# Patient Record
Sex: Female | Born: 1950
Health system: Southern US, Community
[De-identification: ages and names within clinical notes are randomized; demographics above are authoritative.]

## PROBLEM LIST (undated history)

## (undated) ENCOUNTER — Ambulatory Visit

## (undated) ENCOUNTER — Emergency Department (HOSPITAL_BASED_OUTPATIENT_CLINIC_OR_DEPARTMENT_OTHER): Admission: EM | Disposition: A | Discharge status: Home / Self Care | Source: Home / Self Care

## (undated) DIAGNOSIS — K219 Gastro-esophageal reflux disease without esophagitis: Secondary | ICD-10-CM

## (undated) DIAGNOSIS — G43909 Migraine, unspecified, not intractable, without status migrainosus: Secondary | ICD-10-CM

## (undated) DIAGNOSIS — R296 Repeated falls: Secondary | ICD-10-CM

## (undated) DIAGNOSIS — K449 Diaphragmatic hernia without obstruction or gangrene: Secondary | ICD-10-CM

## (undated) DIAGNOSIS — K5790 Diverticulosis of intestine, part unspecified, without perforation or abscess without bleeding: Secondary | ICD-10-CM

## (undated) DIAGNOSIS — F32A Depression, unspecified: Secondary | ICD-10-CM

## (undated) DIAGNOSIS — F603 Borderline personality disorder: Secondary | ICD-10-CM

## (undated) DIAGNOSIS — F6089 Other specific personality disorders: Secondary | ICD-10-CM

## (undated) DIAGNOSIS — E039 Hypothyroidism, unspecified: Secondary | ICD-10-CM

## (undated) DIAGNOSIS — J309 Allergic rhinitis, unspecified: Secondary | ICD-10-CM

## (undated) DIAGNOSIS — K589 Irritable bowel syndrome without diarrhea: Secondary | ICD-10-CM

## (undated) DIAGNOSIS — W19XXXA Unspecified fall, initial encounter: Secondary | ICD-10-CM

## (undated) DIAGNOSIS — H538 Other visual disturbances: Secondary | ICD-10-CM

## (undated) DIAGNOSIS — C4490 Unspecified malignant neoplasm of skin, unspecified: Secondary | ICD-10-CM

## (undated) DIAGNOSIS — R4586 Emotional lability: Secondary | ICD-10-CM

## (undated) DIAGNOSIS — F4310 Post-traumatic stress disorder, unspecified: Secondary | ICD-10-CM

## (undated) DIAGNOSIS — G629 Polyneuropathy, unspecified: Secondary | ICD-10-CM

## (undated) DIAGNOSIS — N809 Endometriosis, unspecified: Secondary | ICD-10-CM

## (undated) DIAGNOSIS — J449 Chronic obstructive pulmonary disease, unspecified: Secondary | ICD-10-CM

## (undated) DIAGNOSIS — D649 Anemia, unspecified: Secondary | ICD-10-CM

## (undated) DIAGNOSIS — F329 Major depressive disorder, single episode, unspecified: Secondary | ICD-10-CM

## (undated) DIAGNOSIS — E079 Disorder of thyroid, unspecified: Secondary | ICD-10-CM

## (undated) HISTORY — PX: TONSILLECTOMY: SUR1361

## (undated) HISTORY — DX: Irritable bowel syndrome, unspecified: K58.9

## (undated) HISTORY — DX: Post-traumatic stress disorder, unspecified: F43.10

## (undated) HISTORY — PX: APPENDECTOMY: SHX54

## (undated) HISTORY — PX: CHOLECYSTECTOMY, LAPAROSCOPIC: SHX56

## (undated) HISTORY — PX: CHOLECYSTECTOMY: SHX55

## (undated) HISTORY — DX: Other specific personality disorders: F60.89

## (undated) HISTORY — DX: Borderline personality disorder: F60.3

## (undated) HISTORY — DX: Repeated falls: R29.6

## (undated) HISTORY — DX: Emotional lability: R45.86

## (undated) HISTORY — DX: Unspecified fall, initial encounter: W19.XXXA

## (undated) HISTORY — PX: OTHER SURGICAL HISTORY: SHX169

## (undated) HISTORY — PX: HERNIA REPAIR: SHX51

---

## 2003-12-30 ENCOUNTER — Emergency Department: Payer: Self-pay | Admitting: General Practice

## 2004-01-19 ENCOUNTER — Emergency Department: Payer: Self-pay | Admitting: Emergency Medicine

## 2005-03-04 ENCOUNTER — Inpatient Hospital Stay: Payer: Self-pay | Admitting: Internal Medicine

## 2005-03-04 ENCOUNTER — Other Ambulatory Visit: Payer: Self-pay

## 2005-05-25 ENCOUNTER — Emergency Department: Payer: Self-pay | Admitting: Emergency Medicine

## 2005-07-05 ENCOUNTER — Ambulatory Visit: Payer: Self-pay | Admitting: Obstetrics and Gynecology

## 2005-08-27 ENCOUNTER — Inpatient Hospital Stay: Payer: Self-pay | Admitting: Unknown Physician Specialty

## 2006-06-30 ENCOUNTER — Emergency Department: Payer: Self-pay | Admitting: General Practice

## 2006-09-13 ENCOUNTER — Ambulatory Visit: Payer: Self-pay | Admitting: Gastroenterology

## 2006-09-26 ENCOUNTER — Ambulatory Visit: Payer: Self-pay | Admitting: Gastroenterology

## 2007-03-26 ENCOUNTER — Ambulatory Visit: Payer: Self-pay | Admitting: Obstetrics and Gynecology

## 2008-04-24 ENCOUNTER — Ambulatory Visit: Payer: Self-pay | Admitting: Obstetrics and Gynecology

## 2008-10-15 ENCOUNTER — Ambulatory Visit: Payer: Self-pay | Admitting: Surgery

## 2008-10-20 ENCOUNTER — Ambulatory Visit: Payer: Self-pay | Admitting: Surgery

## 2009-05-11 ENCOUNTER — Ambulatory Visit: Payer: Self-pay | Admitting: Obstetrics and Gynecology

## 2009-10-05 ENCOUNTER — Ambulatory Visit: Payer: Self-pay | Admitting: Family Medicine

## 2009-10-28 ENCOUNTER — Ambulatory Visit: Payer: Self-pay | Admitting: Gastroenterology

## 2009-12-19 ENCOUNTER — Inpatient Hospital Stay: Payer: Self-pay | Admitting: Internal Medicine

## 2010-09-13 ENCOUNTER — Ambulatory Visit: Payer: Self-pay | Admitting: Obstetrics and Gynecology

## 2010-10-17 ENCOUNTER — Inpatient Hospital Stay: Payer: Self-pay | Admitting: Psychiatry

## 2011-03-26 ENCOUNTER — Emergency Department: Payer: Self-pay | Admitting: *Deleted

## 2011-03-26 LAB — LIPASE, BLOOD: Lipase: 116 U/L (ref 73–393)

## 2011-03-26 LAB — URINALYSIS, COMPLETE
Nitrite: NEGATIVE
Protein: NEGATIVE
RBC,UR: 1 /HPF (ref 0–5)
Squamous Epithelial: 2
WBC UR: 1 /HPF (ref 0–5)

## 2011-03-26 LAB — COMPREHENSIVE METABOLIC PANEL
Albumin: 3.5 g/dL (ref 3.4–5.0)
Anion Gap: 9 (ref 7–16)
BUN: 9 mg/dL (ref 7–18)
Bilirubin,Total: 0.2 mg/dL (ref 0.2–1.0)
Calcium, Total: 8.6 mg/dL (ref 8.5–10.1)
Chloride: 102 mmol/L (ref 98–107)
Co2: 28 mmol/L (ref 21–32)
Creatinine: 0.64 mg/dL (ref 0.60–1.30)
EGFR (African American): >60
EGFR (Non-African Amer.): >60
Osmolality: 275 (ref 275–301)
Potassium: 4.2 mmol/L (ref 3.5–5.1)
SGOT(AST): 20 U/L (ref 15–37)
Total Protein: 7 g/dL (ref 6.4–8.2)

## 2011-03-26 LAB — CBC
HCT: 36.3 % (ref 35.0–47.0)
MCH: 31 pg (ref 26.0–34.0)
MCHC: 33.4 g/dL (ref 32.0–36.0)
MCV: 93 fL (ref 80–100)
RDW: 12.6 % (ref 11.5–14.5)

## 2011-04-20 ENCOUNTER — Ambulatory Visit: Payer: Self-pay | Admitting: Gastroenterology

## 2011-05-05 ENCOUNTER — Emergency Department: Payer: Self-pay | Admitting: Emergency Medicine

## 2011-05-05 LAB — COMPREHENSIVE METABOLIC PANEL
Albumin: 3.5 g/dL (ref 3.4–5.0)
Anion Gap: 9 (ref 7–16)
BUN: 6 mg/dL — ABNORMAL LOW (ref 7–18)
Bilirubin,Total: 0.2 mg/dL (ref 0.2–1.0)
Calcium, Total: 8.3 mg/dL — ABNORMAL LOW (ref 8.5–10.1)
Chloride: 106 mmol/L (ref 98–107)
Co2: 24 mmol/L (ref 21–32)
Creatinine: 0.65 mg/dL (ref 0.60–1.30)
EGFR (African American): >60
Glucose: 72 mg/dL (ref 65–99)
SGPT (ALT): 39 U/L
Sodium: 139 mmol/L (ref 136–145)
Total Protein: 6.9 g/dL (ref 6.4–8.2)

## 2011-05-05 LAB — URINALYSIS, COMPLETE
Bilirubin,UR: NEGATIVE
Glucose,UR: NEGATIVE mg/dL (ref 0–75)
Ph: 6 (ref 4.5–8.0)
Protein: NEGATIVE
RBC,UR: 1 /HPF (ref 0–5)
Specific Gravity: 1.003 (ref 1.003–1.030)
Squamous Epithelial: 3

## 2011-05-05 LAB — CBC
MCHC: 33.1 g/dL (ref 32.0–36.0)
Platelet: 203 10*3/uL (ref 150–440)
WBC: 5.2 10*3/uL (ref 3.6–11.0)

## 2011-05-05 LAB — LIPASE, BLOOD: Lipase: 112 U/L (ref 73–393)

## 2011-05-16 ENCOUNTER — Ambulatory Visit: Payer: Self-pay | Admitting: Gynecologic Oncology

## 2011-05-17 ENCOUNTER — Ambulatory Visit: Payer: Self-pay | Admitting: Gynecologic Oncology

## 2011-06-06 ENCOUNTER — Ambulatory Visit: Payer: Self-pay | Admitting: Gynecologic Oncology

## 2011-06-17 ENCOUNTER — Ambulatory Visit: Payer: Self-pay | Admitting: Gynecologic Oncology

## 2011-11-30 ENCOUNTER — Ambulatory Visit: Payer: Self-pay | Admitting: Obstetrics and Gynecology

## 2012-01-09 ENCOUNTER — Emergency Department: Payer: Self-pay | Admitting: Emergency Medicine

## 2012-01-09 LAB — URINALYSIS, COMPLETE
Bacteria: NONE SEEN
Bilirubin,UR: NEGATIVE
Blood: NEGATIVE
Glucose,UR: NEGATIVE mg/dL (ref 0–75)
Ketone: NEGATIVE
Nitrite: NEGATIVE
Ph: 7 (ref 4.5–8.0)
Protein: NEGATIVE
RBC,UR: <1 /HPF (ref 0–5)
Squamous Epithelial: 3
WBC UR: 1 /HPF (ref 0–5)

## 2012-01-09 LAB — COMPREHENSIVE METABOLIC PANEL
Albumin: 3.5 g/dL (ref 3.4–5.0)
Anion Gap: 8 (ref 7–16)
BUN: 11 mg/dL (ref 7–18)
Chloride: 94 mmol/L — ABNORMAL LOW (ref 98–107)
Co2: 27 mmol/L (ref 21–32)
EGFR (African American): >60
Osmolality: 259 (ref 275–301)
Potassium: 3.9 mmol/L (ref 3.5–5.1)
SGOT(AST): 20 U/L (ref 15–37)
SGPT (ALT): 20 U/L (ref 12–78)
Sodium: 129 mmol/L — ABNORMAL LOW (ref 136–145)
Total Protein: 6.9 g/dL (ref 6.4–8.2)

## 2012-01-09 LAB — CBC
HGB: 13.2 g/dL (ref 12.0–16.0)
MCH: 32.1 pg (ref 26.0–34.0)
Platelet: 302 10*3/uL (ref 150–440)
RBC: 4.13 10*6/uL (ref 3.80–5.20)
RDW: 12.9 % (ref 11.5–14.5)
WBC: 6.6 10*3/uL (ref 3.6–11.0)

## 2012-01-09 LAB — RAPID INFLUENZA A&B ANTIGENS

## 2012-01-09 LAB — LIPASE, BLOOD: Lipase: 196 U/L (ref 73–393)

## 2012-04-03 ENCOUNTER — Ambulatory Visit: Payer: Self-pay | Admitting: Obstetrics and Gynecology

## 2012-04-16 ENCOUNTER — Ambulatory Visit: Payer: Self-pay | Admitting: Specialist

## 2012-07-31 ENCOUNTER — Emergency Department: Payer: Self-pay | Admitting: Internal Medicine

## 2012-07-31 LAB — CBC
MCH: 31.4 pg (ref 26.0–34.0)
MCHC: 34.5 g/dL (ref 32.0–36.0)
MCV: 91 fL (ref 80–100)
RBC: 3.86 10*6/uL (ref 3.80–5.20)
RDW: 13.3 % (ref 11.5–14.5)

## 2012-07-31 LAB — COMPREHENSIVE METABOLIC PANEL
Anion Gap: 4 — ABNORMAL LOW (ref 7–16)
BUN: 11 mg/dL (ref 7–18)
Creatinine: 0.94 mg/dL (ref 0.60–1.30)
Glucose: 111 mg/dL — ABNORMAL HIGH (ref 65–99)
Osmolality: 265 (ref 275–301)
SGPT (ALT): 13 U/L (ref 12–78)
Sodium: 132 mmol/L — ABNORMAL LOW (ref 136–145)
Total Protein: 6.9 g/dL (ref 6.4–8.2)

## 2012-08-01 LAB — URINALYSIS, COMPLETE
Bilirubin,UR: NEGATIVE
Glucose,UR: NEGATIVE mg/dL (ref 0–75)
Ketone: NEGATIVE
Ph: 7 (ref 4.5–8.0)
Protein: NEGATIVE
RBC,UR: <1 /HPF (ref 0–5)

## 2012-10-17 ENCOUNTER — Ambulatory Visit: Payer: Self-pay | Admitting: Gastroenterology

## 2012-12-17 ENCOUNTER — Ambulatory Visit: Payer: Self-pay | Admitting: Surgery

## 2013-01-15 ENCOUNTER — Encounter (HOSPITAL_COMMUNITY): Payer: Self-pay | Admitting: *Deleted

## 2013-01-15 ENCOUNTER — Inpatient Hospital Stay (HOSPITAL_COMMUNITY): Payer: Medicare Other

## 2013-01-15 ENCOUNTER — Inpatient Hospital Stay (HOSPITAL_COMMUNITY)
Admission: AD | Admit: 2013-01-15 | Discharge: 2013-01-15 | Disposition: A | Discharge status: Home / Self Care | Payer: Medicare Other | Source: Ambulatory Visit | Attending: Obstetrics & Gynecology | Admitting: Obstetrics & Gynecology

## 2013-01-15 DIAGNOSIS — R109 Unspecified abdominal pain: Secondary | ICD-10-CM | POA: Insufficient documentation

## 2013-01-15 DIAGNOSIS — N950 Postmenopausal bleeding: Secondary | ICD-10-CM

## 2013-01-15 DIAGNOSIS — N83209 Unspecified ovarian cyst, unspecified side: Secondary | ICD-10-CM

## 2013-01-15 DIAGNOSIS — M549 Dorsalgia, unspecified: Secondary | ICD-10-CM | POA: Insufficient documentation

## 2013-01-15 HISTORY — DX: Hypothyroidism, unspecified: E03.9

## 2013-01-15 HISTORY — DX: Endometriosis, unspecified: N80.9

## 2013-01-15 HISTORY — DX: Gastro-esophageal reflux disease without esophagitis: K21.9

## 2013-01-15 LAB — URINE MICROSCOPIC-ADD ON

## 2013-01-15 LAB — URINALYSIS, ROUTINE W REFLEX MICROSCOPIC
Glucose, UA: NEGATIVE mg/dL
Ketones, ur: NEGATIVE mg/dL
Leukocytes, UA: NEGATIVE
Protein, ur: NEGATIVE mg/dL
Urobilinogen, UA: 0.2 mg/dL (ref 0.0–1.0)

## 2013-01-15 LAB — WET PREP, GENITAL
Clue Cells Wet Prep HPF POC: NONE SEEN
Trich, Wet Prep: NONE SEEN

## 2013-01-15 MED ORDER — CLOBETASOL PROPIONATE 0.05 % EX OINT: 1.0000 "application " | TOPICAL_OINTMENT | Freq: Two times a day (BID) | CUTANEOUS | Status: DC

## 2013-01-15 MED ORDER — FLUCONAZOLE 150 MG PO TABS: 150.0000 mg | ORAL_TABLET | Freq: Once | ORAL | Status: DC

## 2013-01-15 MED ORDER — CLOBETASOL PROPIONATE 0.05 % EX CREA: TOPICAL_CREAM | Freq: Two times a day (BID) | CUTANEOUS | Status: DC

## 2013-01-15 MED ORDER — HYDROCODONE-ACETAMINOPHEN 5-325 MG PO TABS: 1.0000 | ORAL_TABLET | ORAL | Status: DC | PRN

## 2013-01-15 NOTE — MAU Note (Signed)
Pt was seen yesterday by her GYN MD and a speculum exam was done but not a bimanual exam, nor a urine test. Wants to be tested for yeast; says her vagina is irritated and red and painful to urinate.

## 2013-01-15 NOTE — MAU Provider Note (Signed)
History     CSN: [id number]  Arrival date and time: 01/15/13 1757   First Provider Initiated Contact with Patient 01/15/13 1910      Chief Complaint  Patient presents with  . Back Pain  . Abdominal Cramping  . Vaginal Bleeding   HPIpt is 62 year old c/o of vaginal bleeding x 5 days.  Pt abdominal pain and bloated and gas. Pt sees GYN in [place].  Pt has been traveling up [place] and visited father in assisted living- there had been a GI virus. Pt has had loose stools until today and hasn't had bowel movement today. Pt had a colonoscopy about 1 month ago and has had diverticulitis.  Pt took Cipro and flagyl recently for presumed Diverticulitis.   Pt has "uterus suspended" in her 75s. Pt has current boyfriend of 8 years.  Last time pt was sexually active was 3-4 years ago.   Pt saw GYN yesterday in [place] and they did a speculum exam, but no ultrasound. Pt has a hx of endometriosis.   Pt was seen yesterday by her GYN MD and a speculum exam was done but not a bimanual exam, nor a urine test. Wants to be tested for yeast; says her vagina is irritated and red and painful to urinate   Past Medical History  Diagnosis Date  . Endometriosis   . Hypothyroidism   . GERD (gastroesophageal reflux disease)   . Bipolar 1 disorder   . Hypertension     Past Surgical History  Procedure Laterality Date  . Tonsillectomy    . Uteral suspension    . Appendectomy    . Hernia repair    . Laproscopy    . Cholecystectomy, laparoscopic      History reviewed. No pertinent family history.  History  Substance Use Topics  . Smoking status: Never Smoker   . Smokeless tobacco: Not on file  . Alcohol Use: No    Allergies:  Allergies  Allergen Reactions  . Peanuts [Peanut Oil] Anaphylaxis  . Amikacin   . Aspirin   . Darvon [Propoxyphene] Hives  . Penicillins Hives  . Percocet [Oxycodone-Acetaminophen] Swelling    Hives in throat  . Prednisone Other (See Comments)    Crazy mood  swings, strange thoughts  . Sulfa Antibiotics Hives  . Ultram [Tramadol] Hives    Prescriptions prior to admission  Medication Sig Dispense Refill  . amLODipine (NORVASC) 2.5 MG tablet Take 2.5 mg by mouth daily.      . Azelastine-Fluticasone (DYMISTA NA) Place into the nose.      . Biotin 5000 MCG CAPS Take by mouth.      . calcium-vitamin D (OSCAL WITH D) 500-200 MG-UNIT per tablet Take 1 tablet by mouth.      . cholecalciferol (VITAMIN D) 1000 UNITS tablet Take 1,000 Units by mouth daily.      Marland Kitchen conjugated estrogens (PREMARIN) vaginal cream Place 1 Applicatorful vaginally daily.      . divalproex (DEPAKOTE) 500 MG DR tablet Take 500 mg by mouth 3 (three) times daily.      Marland Kitchen docusate sodium (COLACE) 100 MG capsule Take 100 mg by mouth 2 (two) times daily.      . fexofenadine (ALLEGRA) 180 MG tablet Take 180 mg by mouth daily.      . hydrocortisone (ANUSOL-HC) 2.5 % rectal cream Place 1 application rectally 2 (two) times daily.      Marland Kitchen levothyroxine (SYNTHROID, LEVOTHROID) 88 MCG tablet Take 88 mcg by mouth daily  before breakfast.      . Linaclotide (LINZESS PO) Take by mouth.      . multivitamin-iron-minerals-folic acid (THERAPEUTIC-M) TABS tablet Take 1 tablet by mouth daily.      . pantoprazole (PROTONIX) 40 MG tablet Take 40 mg by mouth daily.      Marland Kitchen PARoxetine (PAXIL) 30 MG tablet Take 30 mg by mouth daily.      . risperiDONE (RISPERDAL) 2 MG tablet Take 2 mg by mouth at bedtime.      Marland Kitchen terconazole (TERAZOL 7) 0.4 % vaginal cream Place 1 applicator vaginally at bedtime.      Marland Kitchen zolpidem (AMBIEN) 10 MG tablet Take 10 mg by mouth at bedtime as needed for sleep.      . cycloSPORINE (RESTASIS) 0.05 % ophthalmic emulsion 1 drop 2 (two) times daily.        Review of Systems  Constitutional: Negative for fever and chills.  Gastrointestinal: Positive for abdominal pain and diarrhea.  Genitourinary: Positive for dysuria. Negative for flank pain.  Musculoskeletal: Positive for back pain.    Physical Exam   Blood pressure 137/88, pulse 64, temperature 99 F (37.2 C), temperature source Oral, resp. rate 16, height 5\' 4"  (1.626 m), weight 54.432 kg (120 lb), SpO2 97.00%.  Physical Exam  Nursing note and vitals reviewed. Constitutional: She is oriented to person, place, and time. She appears well-developed and well-nourished. No distress.  HENT:  Head: Normocephalic.  Eyes: Pupils are equal, round, and reactive to light.  Neck: Normal range of motion. Neck supple.  Cardiovascular: Normal rate.   Respiratory: Effort normal.  GI: Soft.  Diffuse tenderness; increased bowel sounds  Genitourinary:  ?LS&A with agglutinated clitoral hood; vaginal introitus reddened; atrophic, urethral caruncle noted; cervix nullip, clean, NT; uterus small retroverted- unable to palpate ovaries- diffuse tenderness  Musculoskeletal: Normal range of motion.  Neurological: She is alert and oriented to person, place, and time.  Skin: Skin is warm.  Psychiatric: She has a normal mood and affect.    MAU Course  Procedures [place]- US Transvaginal Non-ob  01/15/2013   CLINICAL DATA:  Postmenopausal bleeding  EXAM: TRANSABDOMINAL AND TRANSVAGINAL ULTRASOUND OF PELVIS  TECHNIQUE: Both transabdominal and transvaginal ultrasound examinations of the pelvis were performed. Transabdominal technique was performed for global imaging of the pelvis including uterus, ovaries, adnexal regions, and pelvic cul-de-sac. It was necessary to proceed with endovaginal exam following the transabdominal exam to visualize the endometrium.  COMPARISON:  04/20/2011  FINDINGS: Uterus  Measurements: 4.4 x 2.3 x 3.2 cm. No fibroids or other mass visualized. Uterus retroverted.  Endometrium  Thickness: 2 mm.  No focal abnormality visualized.  Right ovary  Measurements: 4.1 x 2.1 x 3.4 cm. Anechoic 2.7 x 1.8 x 2.7 cm right ovarian cyst is included in this measurement. Color flow in the parenchyma of the right ovary is observed.  Left ovary   Measurements: 2.0 x 1.1 x 1.0 cm. Normal appearance/no adnexal mass.  Other findings  No free fluid.  IMPRESSION: 1. Endometrium appears normal and measures only 2 mm in thickness. 2. 2.7 cm simple appearing right ovarian cyst. Given the patient's postmenopausal status, cysts of this size warrant a follow-up ultrasound in 1 year. This recommendation follows the consensus statement: Management of Asymptomatic Ovarian and Other Adnexal Cysts Imaged at [place]: Society of Radiologists in Ultrasound Consensus Conference Statement. Radiology 2010; [id number].   Electronically Signed   By: Herbie Baltimore M.D.   On: 01/15/2013 20:55   US Pelvis Complete  01/15/2013  CLINICAL DATA:  Postmenopausal bleeding  EXAM: TRANSABDOMINAL AND TRANSVAGINAL ULTRASOUND OF PELVIS  TECHNIQUE: Both transabdominal and transvaginal ultrasound examinations of the pelvis were performed. Transabdominal technique was performed for global imaging of the pelvis including uterus, ovaries, adnexal regions, and pelvic cul-de-sac. It was necessary to proceed with endovaginal exam following the transabdominal exam to visualize the endometrium.  COMPARISON:  04/20/2011  FINDINGS: Uterus  Measurements: 4.4 x 2.3 x 3.2 cm. No fibroids or other mass visualized. Uterus retroverted.  Endometrium  Thickness: 2 mm.  No focal abnormality visualized.  Right ovary  Measurements: 4.1 x 2.1 x 3.4 cm. Anechoic 2.7 x 1.8 x 2.7 cm right ovarian cyst is included in this measurement. Color flow in the parenchyma of the right ovary is observed.  Left ovary  Measurements: 2.0 x 1.1 x 1.0 cm. Normal appearance/no adnexal mass.  Other findings  No free fluid.  IMPRESSION: 1. Endometrium appears normal and measures only 2 mm in thickness. 2. 2.7 cm simple appearing right ovarian cyst. Given the patient's postmenopausal status, cysts of this size warrant a follow-up ultrasound in 1 year. This recommendation follows the consensus statement: Management of Asymptomatic Ovarian  and Other Adnexal Cysts Imaged at [place]: Society of Radiologists in Ultrasound Consensus Conference Statement. Radiology 2010; [id number].   Electronically Signed   By: Herbie Baltimore M.D.   On: 01/15/2013 20:55   Results for orders placed during the hospital encounter of 01/15/13 (from the past 24 hour(s))  URINALYSIS, ROUTINE W REFLEX MICROSCOPIC     Status: Abnormal   Collection Time    01/15/13  6:20 PM      Result Value Range   Color, Urine YELLOW  YELLOW   APPearance CLEAR  CLEAR   Specific Gravity, Urine 1.015  1.005 - 1.030   pH 7.0  5.0 - 8.0   Glucose, UA NEGATIVE  NEGATIVE mg/dL   Hgb urine dipstick SMALL (*) NEGATIVE   Bilirubin Urine NEGATIVE  NEGATIVE   Ketones, ur NEGATIVE  NEGATIVE mg/dL   Protein, ur NEGATIVE  NEGATIVE mg/dL   Urobilinogen, UA 0.2  0.0 - 1.0 mg/dL   Nitrite NEGATIVE  NEGATIVE   Leukocytes, UA NEGATIVE  NEGATIVE  URINE MICROSCOPIC-ADD ON     Status: None   Collection Time    01/15/13  6:20 PM      Result Value Range   Squamous Epithelial / LPF RARE  RARE   WBC, UA 0-2  <3 WBC/hpf   RBC / HPF 3-6  <3 RBC/hpf   Bacteria, UA RARE  RARE  WET PREP, GENITAL     Status: Abnormal   Collection Time    01/15/13  7:35 PM      Result Value Range   Yeast Wet Prep HPF POC NONE SEEN  NONE SEEN   Trich, Wet Prep NONE SEEN  NONE SEEN   Clue Cells Wet Prep HPF POC NONE SEEN  NONE SEEN   WBC, Wet Prep HPF POC FEW (*) NONE SEEN      Assessment and Plan  Post menopausal bleeding LS&A- temovate cream apply 2 times daily for 2 weeks then off 2 weeks Care handed over to Thressa Sheller, CNM  1. Postmenopausal bleeding   2. Other and unspecified ovarian cyst    RX: clobetasol 0.5% BID x 2 weeks Treated with diflucan here in MAU  Follow-up Information   Schedule an appointment as soon as possible for a visit to follow up. (for an endometrial biopsy, and to follow up  on cyst in 1 year )    Contact information:   [facility]  [place], [place]      Lajuana Ripple 01/15/2013, 7:11 PM

## 2013-01-15 NOTE — MAU Note (Signed)
Patient states she has had some vaginal bleeding, worse in the am, for 5 days. Has been having abdominal cramping and back pain that has gotten worse today. Was seen by her primary MD yesterday.

## 2013-01-15 NOTE — Progress Notes (Signed)
Some vaginal bleeding noted, pt indicating tenderness in vaginal are during exam.

## 2013-01-16 LAB — GC/CHLAMYDIA PROBE AMP
CT Probe RNA: NEGATIVE
GC Probe RNA: NEGATIVE

## 2013-01-17 ENCOUNTER — Emergency Department: Payer: Self-pay | Admitting: Emergency Medicine

## 2013-01-17 LAB — CBC
HCT: 38.1 % (ref 35.0–47.0)
HGB: 12.9 g/dL (ref 12.0–16.0)
MCH: 31.4 pg (ref 26.0–34.0)
MCHC: 33.9 g/dL (ref 32.0–36.0)
MCV: 93 fL (ref 80–100)
PLATELETS: 254 10*3/uL (ref 150–440)
RBC: 4.12 10*6/uL (ref 3.80–5.20)
RDW: 14.6 % — ABNORMAL HIGH (ref 11.5–14.5)
WBC: 5.1 10*3/uL (ref 3.6–11.0)

## 2013-01-17 LAB — URINALYSIS, COMPLETE
BACTERIA: NONE SEEN
Bilirubin,UR: NEGATIVE
Blood: NEGATIVE
Glucose,UR: NEGATIVE mg/dL (ref 0–75)
Ketone: NEGATIVE
Leukocyte Esterase: NEGATIVE
NITRITE: NEGATIVE
PH: 6 (ref 4.5–8.0)
PROTEIN: NEGATIVE
RBC,UR: 2 /HPF (ref 0–5)
SPECIFIC GRAVITY: 1.016 (ref 1.003–1.030)
WBC UR: 1 /HPF (ref 0–5)

## 2013-01-17 LAB — COMPREHENSIVE METABOLIC PANEL
ALK PHOS: 52 U/L
Albumin: 3.3 g/dL — ABNORMAL LOW (ref 3.4–5.0)
Anion Gap: 3 — ABNORMAL LOW (ref 7–16)
BUN: 18 mg/dL (ref 7–18)
Bilirubin,Total: 0.3 mg/dL (ref 0.2–1.0)
CALCIUM: 8.9 mg/dL (ref 8.5–10.1)
CREATININE: 0.67 mg/dL (ref 0.60–1.30)
Chloride: 97 mmol/L — ABNORMAL LOW (ref 98–107)
Co2: 31 mmol/L (ref 21–32)
EGFR (Non-African Amer.): >60
Glucose: 73 mg/dL (ref 65–99)
Osmolality: 263 (ref 275–301)
Potassium: 4 mmol/L (ref 3.5–5.1)
SGOT(AST): 22 U/L (ref 15–37)
SGPT (ALT): 20 U/L (ref 12–78)
SODIUM: 131 mmol/L — AB (ref 136–145)
Total Protein: 6.8 g/dL (ref 6.4–8.2)

## 2013-01-17 LAB — LIPASE, BLOOD: Lipase: 172 U/L (ref 73–393)

## 2013-01-19 NOTE — MAU Provider Note (Signed)
Attestation of Attending Supervision of Advanced Practitioner (CNM/NP): Evaluation and management procedures were performed by the Advanced Practitioner under my supervision and collaboration. I have reviewed the Advanced Practitioner's note and chart, and I agree with the management and plan.  Manjot Beumer H. 12:47 PM

## 2013-02-05 ENCOUNTER — Ambulatory Visit: Payer: Self-pay | Admitting: Podiatry

## 2013-03-12 ENCOUNTER — Ambulatory Visit (INDEPENDENT_AMBULATORY_CARE_PROVIDER_SITE_OTHER): Payer: Medicare Other | Admitting: Podiatry

## 2013-03-12 ENCOUNTER — Encounter: Payer: Self-pay | Admitting: Podiatry

## 2013-03-12 ENCOUNTER — Ambulatory Visit (INDEPENDENT_AMBULATORY_CARE_PROVIDER_SITE_OTHER): Payer: Medicare Other

## 2013-03-12 VITALS — BP 120/67 | HR 71 | Resp 16 | Ht 64.0 in | Wt 121.0 lb

## 2013-03-12 DIAGNOSIS — M79609 Pain in unspecified limb: Secondary | ICD-10-CM

## 2013-03-12 DIAGNOSIS — M79673 Pain in unspecified foot: Secondary | ICD-10-CM

## 2013-03-12 DIAGNOSIS — IMO0002 Reserved for concepts with insufficient information to code with codable children: Secondary | ICD-10-CM

## 2013-03-12 DIAGNOSIS — M722 Plantar fascial fibromatosis: Secondary | ICD-10-CM

## 2013-03-12 DIAGNOSIS — M2010 Hallux valgus (acquired), unspecified foot: Secondary | ICD-10-CM

## 2013-03-12 DIAGNOSIS — B351 Tinea unguium: Secondary | ICD-10-CM

## 2013-03-12 DIAGNOSIS — M792 Neuralgia and neuritis, unspecified: Secondary | ICD-10-CM

## 2013-03-12 NOTE — Progress Notes (Signed)
   Subjective:    Patient ID: Angela [name], female    DOB: [birth year], 63 y.o.   MRN: [id number]  HPI Comments: i have fungus and i need my toenails trimmed. i have athletes foot usually on both feet. i have pain in both feet because i have hammer toes on my 2nd toes and bunions on both and heel pain on both. Off and on i have taken physical therapy, otc inserts, heel cups, exercises and it hasnt made it go away. This has been going on for 2 yrs and its staying the same.   Foot Pain Associated symptoms include fatigue.      Review of Systems  Constitutional: Positive for activity change and fatigue.       Weight changes  HENT:       Sinus problems  Eyes:       Dry eyes  Respiratory: Negative.   Cardiovascular: Negative.   Gastrointestinal: Positive for constipation.  Endocrine: Positive for heat intolerance.       Excessive heat  Genitourinary: Negative.   Musculoskeletal:       Joint pain Back pain Difficulty walking  Skin: Negative.   Allergic/Immunologic: Positive for environmental allergies and food allergies.  Neurological: Negative.   Hematological: Bruises/bleeds easily.       Slow to heal  Psychiatric/Behavioral: Positive for behavioral problems.       Objective:   Physical Exam: I have reviewed her past medical history medications allergies surgeries history. Vital signs are stable she is alert and oriented x3. Pulses are palpable bilateral neurologic sensorium is intact per since once the monofilament. Deep tendon reflexes are intact bilateral. Muscle strength was 5 over 5 dorsiflexors plantar flexors inverters everters all intrinsic musculature is intact orthopedic evaluation demonstrates all joints distal to the ankle a full range of motion without crepitation. She has severe hallux abductovalgus deformity and hammertoe deformities bilateral. She has pain on palpation to the medial calcaneal tubercles bilateral. Cutaneous evaluation demonstrates supple well  hydrated cutis I see no signs of athletes foot. She has some early signs of nail dystrophy or even onychomycosis but minimally so to the hallux bilateral. Radiographic evaluation does demonstrate osteopenia with severe hallux abductovalgus deformities bilateral.        Assessment & Plan:  Assessment: Chronic intractable plantar fasciitis bilateral. Hallux abductovalgus deformity bilateral. Hammertoe deformities bilateral. Possible onychomycosis bilateral.  Plan: Discussed etiology pathology conservative versus surgical therapies. At this point we initiated dehydrated alcohol injections to the bilateral heels. I will followup with her in 3 weeks for another set of injections.

## 2013-04-07 ENCOUNTER — Ambulatory Visit (INDEPENDENT_AMBULATORY_CARE_PROVIDER_SITE_OTHER): Payer: Medicare Other | Admitting: Podiatry

## 2013-04-07 ENCOUNTER — Encounter: Payer: Self-pay | Admitting: Podiatry

## 2013-04-07 VITALS — BP 120/67 | HR 71 | Resp 18

## 2013-04-07 DIAGNOSIS — IMO0002 Reserved for concepts with insufficient information to code with codable children: Secondary | ICD-10-CM

## 2013-04-07 DIAGNOSIS — M722 Plantar fascial fibromatosis: Secondary | ICD-10-CM

## 2013-04-07 DIAGNOSIS — M792 Neuralgia and neuritis, unspecified: Secondary | ICD-10-CM

## 2013-04-07 MED ORDER — HYDROCODONE-ACETAMINOPHEN 10-500 MG PO TABS: ORAL_TABLET | ORAL | Status: DC

## 2013-04-07 NOTE — Progress Notes (Signed)
She presents today for her second dose of dehydrated alcohol for Baxter's neuritis bilateral heel. She states that it may have done some good that she's not sure how much.  Objective: Vital signs are stable she is alert and oriented x3 pulses are palpable bilateral. She has minimal pain on palpation medial continued tubercles bilateral.  Assessment: Plantar fasciitis with Baxter's neuritis bilateral heel.  Plan: Injected the bilateral heel today with dehydrated alcohol I will followup with her in 3 weeks for her third injection.

## 2013-04-17 ENCOUNTER — Ambulatory Visit: Payer: Self-pay | Admitting: Obstetrics and Gynecology

## 2013-04-30 ENCOUNTER — Ambulatory Visit (INDEPENDENT_AMBULATORY_CARE_PROVIDER_SITE_OTHER): Payer: Medicare Other | Admitting: Podiatry

## 2013-04-30 ENCOUNTER — Encounter: Payer: Self-pay | Admitting: Podiatry

## 2013-04-30 VITALS — BP 120/68 | HR 72 | Resp 16

## 2013-04-30 DIAGNOSIS — IMO0002 Reserved for concepts with insufficient information to code with codable children: Secondary | ICD-10-CM

## 2013-04-30 DIAGNOSIS — M722 Plantar fascial fibromatosis: Secondary | ICD-10-CM

## 2013-04-30 DIAGNOSIS — M792 Neuralgia and neuritis, unspecified: Secondary | ICD-10-CM

## 2013-04-30 MED ORDER — HYDROCODONE-ACETAMINOPHEN 10-325 MG PO TABS: 1.0000 | ORAL_TABLET | Freq: Three times a day (TID) | ORAL | Status: DC | PRN

## 2013-04-30 MED ORDER — HYDROCODONE-ACETAMINOPHEN 5-325 MG PO TABS: 1.0000 | ORAL_TABLET | Freq: Four times a day (QID) | ORAL | Status: DC | PRN

## 2013-04-30 MED ORDER — HYDROCODONE-ACETAMINOPHEN 5-325 MG PO TABS: 1.0000 | ORAL_TABLET | ORAL | Status: DC | PRN

## 2013-04-30 NOTE — Progress Notes (Signed)
She presents today states that her heels are approximately 50% better.  Objective: Vital signs are stable she is alert and oriented x3. Pulses remain palpable bilateral. No calf pain. She still has tenderness on palpation of the medial calcaneal tubercle bilateral.  Assessment: Plantar fasciitis with Baxter's nerve neuritis bilateral foot.  Plan: Discussed etiology pathology conservative versus surgical therapies injected her third dose of dehydrated alcohol to each heel today and I will followup with her in 3 weeks.

## 2013-04-30 NOTE — Addendum Note (Signed)
Addended by: Laury Axon on: 05/12/8339 01:07 PM   Modules accepted: Orders

## 2013-05-19 ENCOUNTER — Ambulatory Visit (INDEPENDENT_AMBULATORY_CARE_PROVIDER_SITE_OTHER): Payer: Medicare Other | Admitting: Podiatry

## 2013-05-19 VITALS — BP 134/75 | HR 70 | Resp 16

## 2013-05-19 DIAGNOSIS — M722 Plantar fascial fibromatosis: Secondary | ICD-10-CM

## 2013-05-19 DIAGNOSIS — IMO0002 Reserved for concepts with insufficient information to code with codable children: Secondary | ICD-10-CM

## 2013-05-19 MED ORDER — MAGNESIUM SULFATE GRAN: GRANULES | Status: DC

## 2013-05-19 NOTE — Progress Notes (Signed)
She presents today for followup of her plantar fasciitis the neuritis of the calcaneal branch of the lateral plantar nerve.  Objective: Pulses are palpable bilateral much decrease in pain to the plantar fascial calcaneal insertion site bilateral right is more painful than left.  Assessment: Neuritis plantar fasciitis right heel over left.  Plan: Injected the bilateral heels with her fourth dose of dehydrated alcohol. Followup with her in a month

## 2013-05-21 ENCOUNTER — Ambulatory Visit: Payer: Medicare Other | Admitting: Podiatry

## 2013-06-10 ENCOUNTER — Ambulatory Visit (INDEPENDENT_AMBULATORY_CARE_PROVIDER_SITE_OTHER): Payer: Medicare Other | Admitting: Podiatry

## 2013-06-10 ENCOUNTER — Encounter: Payer: Self-pay | Admitting: Podiatry

## 2013-06-10 VITALS — BP 110/62 | HR 72 | Resp 16

## 2013-06-10 DIAGNOSIS — M722 Plantar fascial fibromatosis: Secondary | ICD-10-CM

## 2013-06-10 DIAGNOSIS — IMO0002 Reserved for concepts with insufficient information to code with codable children: Secondary | ICD-10-CM

## 2013-06-10 NOTE — Progress Notes (Signed)
Subjective:     Patient ID: Angela [name], female   DOB: 1950/01/30, 63 y.o.   MRN: [id number]  HPI patient states that I'm slowly improving but still having pain in my heels with shooting-like sensations   Review of Systems     Objective:   Physical Exam Neurovascular status intact with continued discomfort in the plantar heels with what appears to be discomfort of the   lateral branch of the calcaneal nerve Assessment:     Neuritis with plantar fasciitis mixed in plantar of both feet with history of unsuccessful treatment prior to initiation of neural lysis treatment    Plan:     Careful injection into the nerve root with a dehydration alcohol Marcaine solution which the patient tolerated well and reappoint one month for reevaluation

## 2013-06-13 ENCOUNTER — Ambulatory Visit: Payer: Medicare Other | Admitting: Podiatry

## 2013-07-07 ENCOUNTER — Ambulatory Visit (INDEPENDENT_AMBULATORY_CARE_PROVIDER_SITE_OTHER): Payer: Medicare Other | Admitting: Podiatry

## 2013-07-07 ENCOUNTER — Encounter: Payer: Self-pay | Admitting: Podiatry

## 2013-07-07 DIAGNOSIS — IMO0002 Reserved for concepts with insufficient information to code with codable children: Secondary | ICD-10-CM

## 2013-07-07 DIAGNOSIS — M722 Plantar fascial fibromatosis: Secondary | ICD-10-CM

## 2013-07-07 DIAGNOSIS — M792 Neuralgia and neuritis, unspecified: Secondary | ICD-10-CM

## 2013-07-07 NOTE — Progress Notes (Signed)
She presents today stating that they were doing so much better now it seems to be reverting back to where they were. She states that she's not a lot of walking and she's been on vacation for the last couple of weeks.  Objective: Vital signs are stable she is alert and oriented x3. She has pain on palpation medial calcaneal tubercles and plantar central calcaneal heel which is associated with Baxter's neuritis.  Assessment: Plantar fasciitis with Baxter's neuritis.  Plan: Injected the bilateral heels today with dehydrated alcohol and I will followup with her in approximately one month and consider more injections as long she is doing better. She's recently purchased a new pair shoes and socks and is trying to improve her gait.

## 2013-07-15 ENCOUNTER — Emergency Department: Payer: Self-pay | Admitting: Emergency Medicine

## 2013-07-15 LAB — CBC WITH DIFFERENTIAL/PLATELET
BASOS PCT: 0.3 %
Basophil #: 0 10*3/uL (ref 0.0–0.1)
EOS ABS: 0 10*3/uL (ref 0.0–0.7)
Eosinophil %: 0.9 %
HCT: 36 % (ref 35.0–47.0)
HGB: 11.9 g/dL — ABNORMAL LOW (ref 12.0–16.0)
Lymphocyte #: 0.8 10*3/uL — ABNORMAL LOW (ref 1.0–3.6)
Lymphocyte %: 19.6 %
MCH: 31 pg (ref 26.0–34.0)
MCHC: 33.2 g/dL (ref 32.0–36.0)
MCV: 93 fL (ref 80–100)
MONO ABS: 0.4 x10 3/mm (ref 0.2–0.9)
Monocyte %: 9.1 %
NEUTROS PCT: 70.1 %
Neutrophil #: 3 10*3/uL (ref 1.4–6.5)
PLATELETS: 241 10*3/uL (ref 150–440)
RBC: 3.86 10*6/uL (ref 3.80–5.20)
RDW: 13.9 % (ref 11.5–14.5)
WBC: 4.3 10*3/uL (ref 3.6–11.0)

## 2013-07-15 LAB — COMPREHENSIVE METABOLIC PANEL
ALBUMIN: 3.1 g/dL — AB (ref 3.4–5.0)
ALK PHOS: 64 U/L
ANION GAP: 5 — AB (ref 7–16)
AST: 19 U/L (ref 15–37)
BUN: 4 mg/dL — AB (ref 7–18)
Bilirubin,Total: 0.2 mg/dL (ref 0.2–1.0)
CHLORIDE: 99 mmol/L (ref 98–107)
CO2: 28 mmol/L (ref 21–32)
Calcium, Total: 8.3 mg/dL — ABNORMAL LOW (ref 8.5–10.1)
Creatinine: 0.84 mg/dL (ref 0.60–1.30)
EGFR (Non-African Amer.): >60
Glucose: 75 mg/dL (ref 65–99)
Osmolality: 260 (ref 275–301)
POTASSIUM: 3.8 mmol/L (ref 3.5–5.1)
SGPT (ALT): 17 U/L (ref 12–78)
Sodium: 132 mmol/L — ABNORMAL LOW (ref 136–145)
TOTAL PROTEIN: 6.7 g/dL (ref 6.4–8.2)

## 2013-07-16 ENCOUNTER — Encounter: Payer: Self-pay | Admitting: Podiatry

## 2013-07-16 ENCOUNTER — Ambulatory Visit (INDEPENDENT_AMBULATORY_CARE_PROVIDER_SITE_OTHER): Payer: Medicare Other | Admitting: Podiatry

## 2013-07-16 VITALS — BP 137/70 | HR 59 | Resp 16

## 2013-07-16 DIAGNOSIS — M2010 Hallux valgus (acquired), unspecified foot: Secondary | ICD-10-CM

## 2013-07-16 DIAGNOSIS — M2040 Other hammer toe(s) (acquired), unspecified foot: Secondary | ICD-10-CM

## 2013-07-16 DIAGNOSIS — M722 Plantar fascial fibromatosis: Secondary | ICD-10-CM

## 2013-07-17 ENCOUNTER — Encounter: Payer: Self-pay | Admitting: Podiatry

## 2013-07-17 NOTE — Progress Notes (Signed)
rx written on 7.1.15  1. spenco orthotics with 3 refills. Dx is pain in limb  2. Heel cups for shoes with 4 refills. Dx is heel pain and plantar fasciitis

## 2013-07-17 NOTE — Progress Notes (Signed)
She presents today complaining of her shoes sitting her feet her hammertoes being too high her heels are still bothering her and she would like to have surgery at all possible. She also brings a list of things that she wants [place] right prescriptions for including single insoles [place]. Toe sponges and toe spacers and heel pads.  Objective: No changes in physical exam.  Assessment hammertoe deformities severe mallet toe deformities severe bunion deformities and plantar fasciitis with neuritis bilaterally.  Plan: Dispensed prescriptions today for exactly what she asked for however she returns stating that she wanted 5 refills and step 3 refills. I expressed to her that she was not surgical candidate her correction would not be very good because of the longevity of the deformities present.

## 2013-07-29 ENCOUNTER — Emergency Department: Payer: Self-pay | Admitting: Emergency Medicine

## 2013-07-29 LAB — URINALYSIS, COMPLETE
BILIRUBIN, UR: NEGATIVE
BLOOD: NEGATIVE
Glucose,UR: NEGATIVE mg/dL (ref 0–75)
Ketone: NEGATIVE
LEUKOCYTE ESTERASE: NEGATIVE
NITRITE: NEGATIVE
PH: 7 (ref 4.5–8.0)
PROTEIN: NEGATIVE
Specific Gravity: 1.004 (ref 1.003–1.030)
WBC UR: 4 /HPF (ref 0–5)

## 2013-07-29 LAB — CBC WITH DIFFERENTIAL/PLATELET
BASOS ABS: 0 10*3/uL (ref 0.0–0.1)
Basophil %: 0.6 %
Eosinophil #: 0.1 10*3/uL (ref 0.0–0.7)
Eosinophil %: 1.4 %
HCT: 35.8 % (ref 35.0–47.0)
HGB: 12 g/dL (ref 12.0–16.0)
LYMPHS ABS: 1.7 10*3/uL (ref 1.0–3.6)
Lymphocyte %: 22 %
MCH: 31.8 pg (ref 26.0–34.0)
MCHC: 33.6 g/dL (ref 32.0–36.0)
MCV: 95 fL (ref 80–100)
MONO ABS: 0.8 x10 3/mm (ref 0.2–0.9)
Monocyte %: 10.9 %
Neutrophil #: 4.9 10*3/uL (ref 1.4–6.5)
Neutrophil %: 65.1 %
PLATELETS: 252 10*3/uL (ref 150–440)
RBC: 3.79 10*6/uL — AB (ref 3.80–5.20)
RDW: 13.5 % (ref 11.5–14.5)
WBC: 7.5 10*3/uL (ref 3.6–11.0)

## 2013-07-29 LAB — BASIC METABOLIC PANEL
ANION GAP: 6 — AB (ref 7–16)
BUN: 6 mg/dL — ABNORMAL LOW (ref 7–18)
CALCIUM: 8.5 mg/dL (ref 8.5–10.1)
CO2: 29 mmol/L (ref 21–32)
CREATININE: 0.68 mg/dL (ref 0.60–1.30)
Chloride: 98 mmol/L (ref 98–107)
EGFR (African American): >60
EGFR (Non-African Amer.): >60
Glucose: 71 mg/dL (ref 65–99)
OSMOLALITY: 262 (ref 275–301)
Potassium: 3.5 mmol/L (ref 3.5–5.1)
SODIUM: 133 mmol/L — AB (ref 136–145)

## 2013-08-02 ENCOUNTER — Emergency Department: Payer: Self-pay | Admitting: Emergency Medicine

## 2013-08-02 LAB — URINALYSIS, COMPLETE
Bacteria: NONE SEEN
Bilirubin,UR: NEGATIVE
Blood: NEGATIVE
Glucose,UR: NEGATIVE mg/dL (ref 0–75)
Ketone: NEGATIVE
Leukocyte Esterase: NEGATIVE
Nitrite: NEGATIVE
PH: 8 (ref 4.5–8.0)
PROTEIN: NEGATIVE
RBC,UR: <1 /HPF (ref 0–5)
Specific Gravity: 1.006 (ref 1.003–1.030)
Squamous Epithelial: 2
WBC UR: <1 /HPF (ref 0–5)

## 2013-08-04 ENCOUNTER — Ambulatory Visit: Payer: Self-pay | Admitting: Gastroenterology

## 2013-08-11 ENCOUNTER — Ambulatory Visit (INDEPENDENT_AMBULATORY_CARE_PROVIDER_SITE_OTHER): Payer: Medicare Other | Admitting: Podiatry

## 2013-08-11 ENCOUNTER — Encounter: Payer: Self-pay | Admitting: Podiatry

## 2013-08-11 DIAGNOSIS — IMO0002 Reserved for concepts with insufficient information to code with codable children: Secondary | ICD-10-CM

## 2013-08-11 DIAGNOSIS — M792 Neuralgia and neuritis, unspecified: Secondary | ICD-10-CM

## 2013-08-11 NOTE — Progress Notes (Signed)
She presents today for followup of neuritis plantar fasciitis of her bilateral heel.  Objective: Pain on palpation medial calcaneal tubercle left greater than right. Pulses are palpable bilateral. No calf pain.  Assessment: Painful Baxter's neuritis/plantar fasciitis bilateral.  Plan: Injected bilateral heels with dehydrated alcohol followup with her in 3-4 weeks.

## 2013-09-01 ENCOUNTER — Emergency Department: Payer: Self-pay | Admitting: Emergency Medicine

## 2013-09-01 ENCOUNTER — Ambulatory Visit (INDEPENDENT_AMBULATORY_CARE_PROVIDER_SITE_OTHER): Payer: Medicare Other | Admitting: Podiatry

## 2013-09-01 VITALS — BP 119/58 | HR 64 | Resp 16

## 2013-09-01 DIAGNOSIS — M792 Neuralgia and neuritis, unspecified: Secondary | ICD-10-CM

## 2013-09-01 DIAGNOSIS — IMO0002 Reserved for concepts with insufficient information to code with codable children: Secondary | ICD-10-CM

## 2013-09-01 DIAGNOSIS — M722 Plantar fascial fibromatosis: Secondary | ICD-10-CM

## 2013-09-01 LAB — CBC WITH DIFFERENTIAL/PLATELET
Basophil #: 0 10*3/uL (ref 0.0–0.1)
Basophil %: 0.8 %
Eosinophil #: 0.1 10*3/uL (ref 0.0–0.7)
Eosinophil %: 1.4 %
HCT: 35.4 % (ref 35.0–47.0)
HGB: 11.6 g/dL — AB (ref 12.0–16.0)
Lymphocyte #: 1.2 10*3/uL (ref 1.0–3.6)
Lymphocyte %: 21.6 %
MCH: 31.5 pg (ref 26.0–34.0)
MCHC: 32.8 g/dL (ref 32.0–36.0)
MCV: 96 fL (ref 80–100)
MONOS PCT: 10.1 %
Monocyte #: 0.6 x10 3/mm (ref 0.2–0.9)
NEUTROS ABS: 3.8 10*3/uL (ref 1.4–6.5)
NEUTROS PCT: 66.1 %
Platelet: 265 10*3/uL (ref 150–440)
RBC: 3.69 10*6/uL — ABNORMAL LOW (ref 3.80–5.20)
RDW: 13.2 % (ref 11.5–14.5)
WBC: 5.7 10*3/uL (ref 3.6–11.0)

## 2013-09-01 LAB — COMPREHENSIVE METABOLIC PANEL
ALBUMIN: 3.4 g/dL (ref 3.4–5.0)
ANION GAP: 6 — AB (ref 7–16)
Alkaline Phosphatase: 64 U/L
BUN: 8 mg/dL (ref 7–18)
Bilirubin,Total: 0.2 mg/dL (ref 0.2–1.0)
CO2: 29 mmol/L (ref 21–32)
Calcium, Total: 8.4 mg/dL — ABNORMAL LOW (ref 8.5–10.1)
Chloride: 97 mmol/L — ABNORMAL LOW (ref 98–107)
Creatinine: 0.75 mg/dL (ref 0.60–1.30)
EGFR (African American): >60
GLUCOSE: 94 mg/dL (ref 65–99)
Osmolality: 263 (ref 275–301)
POTASSIUM: 4.6 mmol/L (ref 3.5–5.1)
SGOT(AST): 18 U/L (ref 15–37)
SGPT (ALT): 18 U/L
SODIUM: 132 mmol/L — AB (ref 136–145)
TOTAL PROTEIN: 6.9 g/dL (ref 6.4–8.2)

## 2013-09-01 LAB — URINALYSIS, COMPLETE
BILIRUBIN, UR: NEGATIVE
BLOOD: NEGATIVE
Glucose,UR: NEGATIVE mg/dL (ref 0–75)
Ketone: NEGATIVE
Leukocyte Esterase: NEGATIVE
Nitrite: NEGATIVE
Ph: 7 (ref 4.5–8.0)
Protein: NEGATIVE
RBC,UR: 1 /HPF (ref 0–5)
Specific Gravity: 1.003 (ref 1.003–1.030)
Squamous Epithelial: 4
WBC UR: 3 /HPF (ref 0–5)

## 2013-09-01 LAB — LIPASE, BLOOD: LIPASE: 202 U/L (ref 73–393)

## 2013-09-01 NOTE — Progress Notes (Signed)
She states that she is doing much better particularly on the right side the left side bothers her and she also has tendinitis to the Achilles tendon left.  Objective: Pulses are palpable bilateral. She has pain on palpation medial continued tubercles bilateral. Mild pain on palpation of the left Achilles tendon no abnormalities are palpated.  Assessment: Plantar fasciitis Baxter's neuritis bilateral.  Plan: Reinjected the bilateral heels today dehydrated alcohol. Followup with her in a month

## 2013-09-03 LAB — HM DEXA SCAN

## 2013-09-24 ENCOUNTER — Ambulatory Visit (INDEPENDENT_AMBULATORY_CARE_PROVIDER_SITE_OTHER): Payer: Medicare Other | Admitting: Podiatry

## 2013-09-24 ENCOUNTER — Encounter: Payer: Self-pay | Admitting: *Deleted

## 2013-09-24 VITALS — BP 106/41 | HR 70 | Resp 16

## 2013-09-24 DIAGNOSIS — M722 Plantar fascial fibromatosis: Secondary | ICD-10-CM

## 2013-09-24 NOTE — Progress Notes (Signed)
Pt arrived 20 minutes late for her appt today. Shelly told pt she could be seen today but would have to wait till the next spot opened. Pt stated ok. Pt states she did not have a ride so she can wait. Pt got upset for waiting for a hour and her ride showed up and left without seeing dr Milinda Pointer.

## 2013-09-25 NOTE — Progress Notes (Signed)
Pt left facility without being seen  

## 2013-10-08 ENCOUNTER — Ambulatory Visit: Payer: Medicare Other | Admitting: Podiatry

## 2013-10-22 ENCOUNTER — Ambulatory Visit (INDEPENDENT_AMBULATORY_CARE_PROVIDER_SITE_OTHER): Payer: Medicare Other | Admitting: Podiatry

## 2013-10-22 VITALS — BP 118/60 | HR 66 | Resp 16

## 2013-10-22 DIAGNOSIS — B351 Tinea unguium: Secondary | ICD-10-CM

## 2013-10-22 DIAGNOSIS — M792 Neuralgia and neuritis, unspecified: Secondary | ICD-10-CM

## 2013-10-22 DIAGNOSIS — M79676 Pain in unspecified toe(s): Secondary | ICD-10-CM

## 2013-10-22 DIAGNOSIS — M722 Plantar fascial fibromatosis: Secondary | ICD-10-CM

## 2013-10-22 NOTE — Progress Notes (Signed)
She presents today with a chief complaint of bilateral heel pain she states that the neuritis and her heels continues to be painful. She's also complaining of painful E. elongated toenails today and would like padding for her toes.  Objective: Vital signs are stable she is alert and oriented x3. There is no erythema edema saline is drainage or odor she has pain on palpation medial continued tubercles bilateral. Hammertoe deformities with elongated ache yellow dystrophic onychomycotic nails.  Assessment: Pain in limb second onychomycosis and neuritis bilateral heels.  Plan: Debridement of nails bilaterally today and injected bilateral heels with dehydrated alcohol. I will followup with approximately 6 we

## 2013-11-06 ENCOUNTER — Emergency Department: Payer: Self-pay | Admitting: Emergency Medicine

## 2013-11-06 LAB — URINALYSIS, COMPLETE
BILIRUBIN, UR: NEGATIVE
Blood: NEGATIVE
Glucose,UR: 50 mg/dL (ref 0–75)
Ketone: NEGATIVE
Leukocyte Esterase: NEGATIVE
Nitrite: NEGATIVE
PROTEIN: NEGATIVE
Ph: 6 (ref 4.5–8.0)
RBC, UR: NONE SEEN /HPF (ref 0–5)
Specific Gravity: 1.004 (ref 1.003–1.030)
Squamous Epithelial: 2
WBC UR: 1 /HPF (ref 0–5)

## 2013-11-06 LAB — CBC WITH DIFFERENTIAL/PLATELET
Basophil #: 0 10*3/uL (ref 0.0–0.1)
Basophil %: 0.5 %
Eosinophil #: 0.1 10*3/uL (ref 0.0–0.7)
Eosinophil %: 2.1 %
HCT: 35.6 % (ref 35.0–47.0)
HGB: 11.8 g/dL — ABNORMAL LOW (ref 12.0–16.0)
Lymphocyte #: 1.1 10*3/uL (ref 1.0–3.6)
Lymphocyte %: 20.2 %
MCH: 31.4 pg (ref 26.0–34.0)
MCHC: 33.2 g/dL (ref 32.0–36.0)
MCV: 94 fL (ref 80–100)
MONOS PCT: 9.5 %
Monocyte #: 0.5 x10 3/mm (ref 0.2–0.9)
Neutrophil #: 3.8 10*3/uL (ref 1.4–6.5)
Neutrophil %: 67.7 %
Platelet: 233 10*3/uL (ref 150–440)
RBC: 3.77 10*6/uL — ABNORMAL LOW (ref 3.80–5.20)
RDW: 12.8 % (ref 11.5–14.5)
WBC: 5.5 10*3/uL (ref 3.6–11.0)

## 2013-11-06 LAB — BASIC METABOLIC PANEL
Anion Gap: 10 (ref 7–16)
BUN: 8 mg/dL (ref 7–18)
CHLORIDE: 100 mmol/L (ref 98–107)
CO2: 24 mmol/L (ref 21–32)
Calcium, Total: 7.5 mg/dL — ABNORMAL LOW (ref 8.5–10.1)
Creatinine: 0.84 mg/dL (ref 0.60–1.30)
EGFR (African American): >60
Glucose: 162 mg/dL — ABNORMAL HIGH (ref 65–99)
Osmolality: 270 (ref 275–301)
Potassium: 3.2 mmol/L — ABNORMAL LOW (ref 3.5–5.1)
Sodium: 134 mmol/L — ABNORMAL LOW (ref 136–145)

## 2013-11-12 ENCOUNTER — Ambulatory Visit (INDEPENDENT_AMBULATORY_CARE_PROVIDER_SITE_OTHER): Payer: Medicare Other | Admitting: Podiatry

## 2013-11-12 DIAGNOSIS — M792 Neuralgia and neuritis, unspecified: Secondary | ICD-10-CM

## 2013-11-12 DIAGNOSIS — M722 Plantar fascial fibromatosis: Secondary | ICD-10-CM

## 2013-11-12 NOTE — Progress Notes (Signed)
She presents today complaining of bilateral neuritis left heels.  Objective: Vital signs are stable she is alert and oriented 3. There is no erythema edema sailors drainage or odor she has pain on palpation medial tubercles in the plantar central tubercle to bilateral heel.  Assessment: Chronic neuritis plantar fasciitis bilateral heels.  Plan: Reinjected bilateral heels today with dehydrated alcohol

## 2013-11-17 ENCOUNTER — Encounter: Payer: Self-pay | Admitting: Podiatry

## 2013-12-02 ENCOUNTER — Ambulatory Visit: Payer: Self-pay | Admitting: Otolaryngology

## 2013-12-03 ENCOUNTER — Ambulatory Visit: Payer: Medicare Other | Admitting: Podiatry

## 2013-12-04 ENCOUNTER — Ambulatory Visit: Payer: Self-pay | Admitting: Otolaryngology

## 2013-12-08 ENCOUNTER — Ambulatory Visit (INDEPENDENT_AMBULATORY_CARE_PROVIDER_SITE_OTHER): Payer: Medicare Other | Admitting: Podiatry

## 2013-12-08 ENCOUNTER — Encounter: Payer: Self-pay | Admitting: *Deleted

## 2013-12-08 VITALS — BP 106/65 | HR 78 | Resp 16

## 2013-12-08 DIAGNOSIS — M792 Neuralgia and neuritis, unspecified: Secondary | ICD-10-CM

## 2013-12-08 DIAGNOSIS — M79673 Pain in unspecified foot: Secondary | ICD-10-CM

## 2013-12-08 DIAGNOSIS — M722 Plantar fascial fibromatosis: Secondary | ICD-10-CM

## 2013-12-08 NOTE — Progress Notes (Signed)
Spoke with Angela [name] at dr potter's office (neurology) stated that pt needed to be seen for neuropathy b/l feet on her appt. She said she would add that to her visit scheduled for 12.1.15.

## 2013-12-08 NOTE — Progress Notes (Signed)
She presents today for a follow-up of her neuritis plantar aspect of the bilateral heel. She states that the pain from the heel is starting to spread to the forefoot and starting to develop tingling to my forefoot and right ankle.  Objective: Vital signs are stable she is alert and oriented 3. Pulses are palpable bilateral. She has pain on palpation medial calcaneal tubercles bilateral. No reproducible pain to the forefoot.  Assessment: Neuritis can't rule out neuropathy bilateral.  Plan: Request a consult from Dr. Melrose Nakayama to evaluate for neuropathy. Reinjected her bilateral heels with dehydrated alcohol which she states that relieved her symptoms through the entire foot almost immediately.

## 2013-12-16 DIAGNOSIS — R519 Headache, unspecified: Secondary | ICD-10-CM | POA: Insufficient documentation

## 2013-12-16 DIAGNOSIS — R51 Headache: Secondary | ICD-10-CM

## 2013-12-16 DIAGNOSIS — G629 Polyneuropathy, unspecified: Secondary | ICD-10-CM | POA: Insufficient documentation

## 2013-12-31 ENCOUNTER — Ambulatory Visit: Payer: Medicare Other | Admitting: Podiatry

## 2014-01-12 ENCOUNTER — Ambulatory Visit: Payer: Medicare Other | Admitting: Podiatry

## 2014-01-21 ENCOUNTER — Ambulatory Visit: Payer: Medicare Other | Admitting: Podiatry

## 2014-02-04 ENCOUNTER — Ambulatory Visit (INDEPENDENT_AMBULATORY_CARE_PROVIDER_SITE_OTHER): Payer: Medicare Other | Admitting: Podiatry

## 2014-02-04 VITALS — BP 132/86 | HR 73 | Resp 16

## 2014-02-04 DIAGNOSIS — M722 Plantar fascial fibromatosis: Secondary | ICD-10-CM | POA: Diagnosis not present

## 2014-02-04 DIAGNOSIS — M792 Neuralgia and neuritis, unspecified: Secondary | ICD-10-CM

## 2014-02-04 NOTE — Progress Notes (Signed)
She presents today for follow-up of plantar fasciitis and neuritis bilateral heels. She states the injections seem to be helping. However concerned about the hammertoes and bunions.  Objective: Vital signs are stable she is alert and oriented 3. Pulses are strongly palpable bilateral. Neurologic sensorium is intact for Semmes-Weinstein monofilament. She has pain on palpation medial calcaneal tubercles bilateral. Hammertoe deformities and HAV deformities are bilateral and severe in nature.  Assessment: Neuritis plantar fasciitis bilateral.  Plan: Reinjected her bilateral heels today and will follow up with her in 2 weeks. We injected her heels today with dehydrated alcohol

## 2014-02-28 ENCOUNTER — Emergency Department: Payer: Self-pay | Admitting: Emergency Medicine

## 2014-03-30 ENCOUNTER — Ambulatory Visit (INDEPENDENT_AMBULATORY_CARE_PROVIDER_SITE_OTHER): Payer: Medicare Other | Admitting: Podiatry

## 2014-03-30 DIAGNOSIS — M722 Plantar fascial fibromatosis: Secondary | ICD-10-CM | POA: Diagnosis not present

## 2014-03-30 DIAGNOSIS — M792 Neuralgia and neuritis, unspecified: Secondary | ICD-10-CM | POA: Diagnosis not present

## 2014-03-30 NOTE — Progress Notes (Signed)
She presents today for follow-up of her flexors neuritis plantar aspect of the bilateral foot. She states that she seems to be doing better.  Objective: Vital signs are stable she is alert and oriented 3 she has much decreased pain on palpation medial calcaneal tubercle of the right heel as opposed to the left. This is a improvement from last visit.  Assessment: Fractures neuritis/plantar fasciitis bilateral.  Plan: Reinjected bilateral heels today with dehydrated alcohol. Follow-up with her in a month or so. I encouraged her to start applying rubbing alcohol between her toes to help keep the toes dry.

## 2014-04-06 ENCOUNTER — Ambulatory Visit: Payer: Medicare Other | Admitting: Podiatry

## 2014-04-14 ENCOUNTER — Inpatient Hospital Stay
Admit: 2014-04-14 | Disposition: A | Discharge status: Home / Self Care | Payer: Self-pay | Attending: Internal Medicine | Admitting: Internal Medicine

## 2014-04-14 LAB — URINALYSIS, COMPLETE
BACTERIA: NONE SEEN
BILIRUBIN, UR: NEGATIVE
Blood: NEGATIVE
Glucose,UR: NEGATIVE mg/dL (ref 0–75)
KETONE: NEGATIVE
Leukocyte Esterase: NEGATIVE
Nitrite: NEGATIVE
Ph: 7 (ref 4.5–8.0)
Protein: NEGATIVE
Specific Gravity: 1.002 (ref 1.003–1.030)
WBC UR: <1 /HPF (ref 0–5)

## 2014-04-14 LAB — COMPREHENSIVE METABOLIC PANEL
ALBUMIN: 3.8 g/dL
ALT: 17 U/L
AST: 31 U/L
Alkaline Phosphatase: 65 U/L
Anion Gap: 11 (ref 7–16)
BUN: 6 mg/dL
Bilirubin,Total: <0.1 mg/dL — ABNORMAL LOW
CALCIUM: 8.3 mg/dL — AB
Chloride: 90 mmol/L — ABNORMAL LOW
Co2: 24 mmol/L
Creatinine: 0.64 mg/dL
EGFR (African American): >60
EGFR (Non-African Amer.): >60
Glucose: 98 mg/dL
Potassium: 4 mmol/L
Sodium: 125 mmol/L — ABNORMAL LOW
Total Protein: 6.6 g/dL

## 2014-04-14 LAB — ETHANOL

## 2014-04-14 LAB — DRUG SCREEN, URINE
AMPHETAMINES, UR SCREEN: NEGATIVE
BENZODIAZEPINE, UR SCRN: NEGATIVE
Barbiturates, Ur Screen: NEGATIVE
Cannabinoid 50 Ng, Ur ~~LOC~~: NEGATIVE
Cocaine Metabolite,Ur ~~LOC~~: NEGATIVE
MDMA (Ecstasy)Ur Screen: NEGATIVE
Methadone, Ur Screen: NEGATIVE
OPIATE, UR SCREEN: NEGATIVE
PHENCYCLIDINE (PCP) UR S: NEGATIVE
Tricyclic, Ur Screen: NEGATIVE

## 2014-04-14 LAB — CBC
HCT: 35.1 % (ref 35.0–47.0)
HGB: 12 g/dL (ref 12.0–16.0)
MCH: 31.9 pg (ref 26.0–34.0)
MCHC: 34.2 g/dL (ref 32.0–36.0)
MCV: 93 fL (ref 80–100)
PLATELETS: 226 10*3/uL (ref 150–440)
RBC: 3.76 10*6/uL — ABNORMAL LOW (ref 3.80–5.20)
RDW: 13.1 % (ref 11.5–14.5)
WBC: 4.7 10*3/uL (ref 3.6–11.0)

## 2014-04-14 LAB — OSMOLALITY: OSMOLALITY: 265 mosm/kg — AB (ref 280–301)

## 2014-04-14 LAB — TSH: Thyroid Stimulating Horm: 0.619 u[IU]/mL

## 2014-04-14 LAB — ACETAMINOPHEN LEVEL: Acetaminophen: <10 ug/mL

## 2014-04-14 LAB — SALICYLATE LEVEL: Salicylates, Serum: <4 mg/dL

## 2014-04-15 LAB — BASIC METABOLIC PANEL
ANION GAP: 8 (ref 7–16)
BUN: 7 mg/dL
CALCIUM: 8 mg/dL — AB
Chloride: 100 mmol/L — ABNORMAL LOW
Co2: 27 mmol/L
Creatinine: 0.58 mg/dL
EGFR (African American): >60
EGFR (Non-African Amer.): >60
Glucose: 102 mg/dL — ABNORMAL HIGH
Potassium: 3.8 mmol/L
Sodium: 135 mmol/L

## 2014-04-16 LAB — CBC WITH DIFFERENTIAL/PLATELET
BASOS ABS: 0 10*3/uL (ref 0.0–0.1)
Basophil %: 1.2 %
EOS PCT: 2.2 %
Eosinophil #: 0.1 10*3/uL (ref 0.0–0.7)
HCT: 33.6 % — AB (ref 35.0–47.0)
HGB: 11.1 g/dL — ABNORMAL LOW (ref 12.0–16.0)
Lymphocyte #: 0.9 10*3/uL — ABNORMAL LOW (ref 1.0–3.6)
Lymphocyte %: 31.1 %
MCH: 30.8 pg (ref 26.0–34.0)
MCHC: 33 g/dL (ref 32.0–36.0)
MCV: 94 fL (ref 80–100)
MONO ABS: 0.6 x10 3/mm (ref 0.2–0.9)
Monocyte %: 20.3 %
NEUTROS ABS: 1.4 10*3/uL (ref 1.4–6.5)
NEUTROS PCT: 45.2 %
Platelet: 188 10*3/uL (ref 150–440)
RBC: 3.6 10*6/uL — AB (ref 3.80–5.20)
RDW: 13.1 % (ref 11.5–14.5)
WBC: 3 10*3/uL — ABNORMAL LOW (ref 3.6–11.0)

## 2014-04-16 LAB — BASIC METABOLIC PANEL
Anion Gap: 5 — ABNORMAL LOW (ref 7–16)
BUN: 6 mg/dL
CHLORIDE: 99 mmol/L — AB
CO2: 28 mmol/L
Calcium, Total: 7.6 mg/dL — ABNORMAL LOW
Creatinine: 0.51 mg/dL
EGFR (Non-African Amer.): >60
Glucose: 99 mg/dL
POTASSIUM: 3.8 mmol/L
Sodium: 132 mmol/L — ABNORMAL LOW

## 2014-04-16 LAB — OSMOLALITY, URINE: Osmolality: 272 mOsm/kg

## 2014-04-20 LAB — CULTURE, BLOOD (SINGLE)

## 2014-04-27 ENCOUNTER — Ambulatory Visit: Payer: Medicare Other | Admitting: Podiatry

## 2014-05-11 ENCOUNTER — Encounter: Payer: Self-pay | Admitting: Podiatry

## 2014-05-11 ENCOUNTER — Ambulatory Visit (INDEPENDENT_AMBULATORY_CARE_PROVIDER_SITE_OTHER): Payer: Medicare Other | Admitting: Podiatry

## 2014-05-11 VITALS — Ht 63.0 in

## 2014-05-11 DIAGNOSIS — B351 Tinea unguium: Secondary | ICD-10-CM

## 2014-05-11 DIAGNOSIS — M79676 Pain in unspecified toe(s): Secondary | ICD-10-CM

## 2014-05-11 DIAGNOSIS — M792 Neuralgia and neuritis, unspecified: Secondary | ICD-10-CM | POA: Diagnosis not present

## 2014-05-12 NOTE — Progress Notes (Signed)
She presents today stating that her heels are still bothering her that they're doing much better than they have been previously. She also like to have her painfully elongated nails cut she says they're getting some longer starting to tear her socks and they are painful.  Objective: Vital signs are stable she is alert and oriented 3. Pulses are palpable bilateral. Neurologic sensorium is intact bilateral. Hammertoe deformities bilateral. She is pain on palpation plantar central calcaneal tubercle very much less degree than previously noted. Her toenails are thick yellow dystrophic onychomycotic severely elongated and incurvated.  Assessment: Baxter neuritis with plantar fasciitis bilateral. Painfully elongated mycotic nails 1 through 5 bilateral.  Plan: Debridement of nails 1 through 5 bilateral covered service secondary to pain.

## 2014-05-17 NOTE — Discharge Summary (Signed)
PATIENT NAME:  Angela [name], Angela [name] MR#:  [id number] DATE OF BIRTH:  10/19/1950  DATE OF ADMISSION:  04/14/2014 DATE OF DISCHARGE:  04/16/2014  ADMITTING PHYSICIAN: Gladstone Lighter, MD  DISCHARGING PHYSICIAN: Gladstone Lighter, MD  PRIMARY CARE PHYSICIAN: Irven Easterly. Kary Kos, [place]: Psychiatric consultation by Gonzella Lex, MD.   DISCHARGE DIAGNOSES:  1. Acute on chronic hyponatremia.  2. Primary polydipsia.  3. Chronic bronchitis and chronic cough.  4. Schizophrenia with paranoid behavior.  5. Bipolar with depression.  6. Constipation.  DISCHARGE MEDICATIONS:  1. Multivitamin 1 tablet p.o. daily.  2. Tussin 5 mL every 4 hours as needed for cough.  3. Magnesium citrate 300 mL orally once as needed for constipation.  4. Milk of magnesia 50 mL daily.  5. Citrucel 500 mg 2 tablets once a day as needed.  6. Triamcinolone ointment topically 3 times a day.  7. EpiPen injection as needed for allergies.  8. Colace 200 mg  p.o. b.i.d.  9. Calcium carbonate 600 mg p.o. 3 times a day.  10. Probiotic 1 capsule p.o. 3 times a day.  11. Zyrtec 10 mg p.o. daily.  12. Ammonium lactate 12% topical to effected area once a day after shower.  13. Vitamin D3 1000 international units p.o. daily.  14. Biotin 1 tablet p.o. daily.  15. Linzess 290 mg capsule daily for constipation.  16. Azelastine nasal spray 2 sprays each nostril once a day as needed.  17. Flonase 50 mcg two sprays each nostril once a day as needed.  18. Tizanidine 4 mg 1 tablet daily p.r.n.  19. Norco 5/325 mg 1 tablet every 6 hours p.r.n. for pain.  20. Depakote ER 250 mg p.o. daily.  21. Depakote ER 500 mg p.o. at bedtime.  22. Paxil 40 mg p.o. daily.  23. Risperdal 0.5 mg  p.o. b.i.d.  24. Ambien 10 mg p.o. daily.  25. Norvasc 2.5 mg p.o. daily.  26. Premarin vaginal cream 1 application twice a week.  27. Protonix 40 mg p.o. b.i.d.  28. Lamisil topical 1% cream to affected area twice a day.   29. Anusol hydrocortisone 2.5% rectal cream 1 application rectal 3 times a day.   30. Azithromycin 500 mg p.o. b.i.d. for 7 days.  31. Synthroid 88 mcg p.o. daily.  32. Tessalon Perles 100 mg p.o. every 4 hours p.r.n. for cough.  33. Robitussin  AC 10 mL every 6 hours p.r.n. for cough.  34. Cepacol lozenges every 2 hours as needed for sore throat, cough.   DISCHARGE DIET: Regular diet.   DISCHARGE ACTIVITY: As tolerated.    FOLLOWUP INSTRUCTIONS:  1. PCP followup in 1 to 2 weeks.  2. Mental health provider followup in 2 weeks.  3. Fluid restriction to less than 2 liters per day.   LABORATORIES AND IMAGING STUDIES DISCHARGE: Sodium 132, potassium 3.8, chloride 99, bicarbonate 28, BUN 6, creatinine 0.5 and glucose 99 and calcium of 7.6. Urine osmolality is low at 272. Serum osmolarity is also decreased at 265.   Blood cultures are negative.   WBC 3.3, hemoglobin 11.1, hematocrit 32.6, platelet count 188,000.   Chest x-ray on admission showing clear lung fields, emphysematous changes. No pneumonia.   BRIEF HOSPITAL COURSE: The patient is a 64 year old Caucasian female with past medical history significant for schizophrenia with paranoia, bipolar disorder with depression and chronic bronchitis, who is from a group home, was evicted from a group home and dropped off in the hospital. On labs,  she was noted to have low sodium, so was admitted to medical service.   1. Hyponatremia, acute on chronic. The patient does have a primary polydipsia and drinks about 4 to 5 liters of water every day. Her urine and serum osmolarity are low, indicating the same. With fluid restriction, her sodium has improved and is advised to strongly follow the fluid restriction again, even as an outpatient. She will need a primary care physician followup. The patient understands this and acknowledged it.  2. Chronic bronchitis with cough. Seen in the urgent care recently, started on Biaxin. We will continue that. The  patient is adamant about getting 1 gram of Biaxin every 12 hours, which is what she got for 3 days in the hospital, but is being discharged on 500 mg b.i.d. for 7 more days. The dose has been verified to the patient care pharmacist. The patient does have a chronic cough and she is convinced she has pneumonia, even though the chest x-ray is negative.  3. Bipolar with schizophrenia. Continue her home medications. Seen by Dr. Weber Cooks in the hospital. No changes were made.  4. Her course has been otherwise uneventful in the hospital. Physical therapy recommended going back to group home. However, the patient refused to go back to the family care home, though she had spot available. She instead decided to stay in a motel.   DISCHARGE CONDITION: Medically stable.   TIME SPENT ON DISCHARGE: 40 minutes.   ____________________________ Gladstone Lighter, MD rk:AT D: 04/17/2014 15:24:26 ET T: 04/17/2014 16:33:18 ET JOB#: [id number]  cc: Gladstone Lighter, MD, <Dictator> Irven Easterly. Kary Kos, MD Gladstone Lighter MD ELECTRONICALLY SIGNED 04/23/2014 12:25

## 2014-05-17 NOTE — Consult Note (Signed)
PATIENT NAME:  Angela [name], Angela [name] MR#:  [id number] DATE OF BIRTH:  01/18/1950  DATE OF CONSULTATION:  04/15/2014  CONSULTING PHYSICIAN:  Gonzella Lex, MD  IDENTIFYING INFORMATION AND REASON FOR CONSULTATION: A 64 year old woman with a history of bipolar disorder versus personality disorder versus schizoaffective disorder who was admitted through the Emergency Room. Consult because of chronic mental health issues.   HISTORY OF PRESENT ILLNESS: Information from the patient and the chart; also discussion with the hospitalist. The patient tells me that she came to the Emergency Room because of concern about her respiratory infection. The day prior she had gone to urgent care and had been told that she had an infection and placed on antibiotics, but because she was continuing to cough and feels achy and feverish, she came to the Emergency Room. At the same time, she reports that she no longer has a place to live. She has run out her 30 day discharge limit with the home that she has recently been staying in. Her mood is described as anxious and irritable. Her sleep is chronically somewhat impaired. The patient denies having any suicidal or homicidal ideation. Denies having any hallucinations. Says that at the times she admits that she gets subjectively paranoid, but she has not been acting on it. She claims that at the home where she has recently been living (community care home), she was treated badly by the staff. She has multiple vague complaints about the way that they spoke to her and the way she was treated. The patient says that her mood is irritable, but not particularly depressed. She has been compliant with her medication as prescribed by her provider at [facility].   PAST PSYCHIATRIC HISTORY: Long history of chronic mental health problems. I have worked with this patient before in the hospital and know that, although she will have intermittent psychotic symptoms and mood instability, there is  also a lot of her behavior that is very typical of personality disorder or an autistic-like approach emotionally. She has chronic difficulties getting along with people and compromising. She has been treated with multiple medications and her current doses of Depakote and Risperdal appear to be fairly stable. She does have a past history of suicide attempts but it has been years since then. It has been about 4 years since her last hospitalization.   SUBSTANCE ABUSE HISTORY: Denies any current or past history of alcohol or drug abuse.   PAST MEDICAL HISTORY: Currently she says she has a respiratory infection and was told that it could be pneumonia, although what she is complaining of are mostly cough and mild fever. She also has high blood pressure, hypothyroidism, gastric reflux symptoms all chronically.   FAMILY HISTORY: Multiple members of her family with mood instability problems.   SOCIAL HISTORY: The patient is her own legal guardian. She gets a check regularly. She has had trouble staying stable at any living facility. Typically has trouble getting along with other people and frequently feels like people are taking advantage of her or treating her badly. She no longer has a boyfriend she relies on. Her family all live out of state and are not closely involved in her care. She says that the [facility] at [facility] have been helping her a little bit, but she even complains about that.   CURRENT MEDICATIONS: Norco 5 mg q. 6 hours pain, Norvasc 2.5 mg daily, vitamin D3 at 1000 units daily, Biaxin 1000 mg twice a day, Depakote 250 mg in the  morning, 500 mg at night, Colace 200 mg twice a day, levothyroxine 88 mcg once a day, pantoprazole 40 mg twice a day, Paxil 40 mg daily, Risperdal 0.5 mg twice a day, zolpidem 5 mg at night.   ALLERGIES: ALKA-SELTZER, DARVON, EPINEPHRINE, HALDOL.   REVIEW OF SYSTEMS: Cough. Feeling like she is having trouble breathing. Productive only of phlegm. Nausea without  vomiting. She feels like she is running a fever. She has some chills. Mentally, her mood is irritable and anxious, but not particularly depressed. Denies suicidal ideation. Denies hallucinations. The rest of the review of systems unremarkable.   MENTAL STATUS EXAMINATION: Disheveled, acutely and chronically sick-looking woman, looks her stated age, cooperative with the interview. Eye contact good. Psychomotor activity normal. Speech is decreased in total amount, but still able to have a full and lucid conversation. Affect is slightly irritable, but not hostile. Mood is stated as being not so good. Thoughts are generally lucid, although it does not take much to distract her into complaining about the way people have treated her in the past. She is still easily redirectable back to the topic. No obvious delusions, although a vague paranoia towards most people in a sense that she is not being helped and might be taken advantage of. She is alert and oriented x 4. Can repeat 3 words immediately and remembers all 3 at 3 minutes. Judgment and insight: Some chronic impairments as noted above but stable. Short and long-term memory intact.    VITAL SIGNS: Temperature 99 currently. It was 105 yesterday evening. Pulse is 62, respirations 18, blood pressure 121/78.   LABORATORY DATA: Results: Chemistry panel shows alcohol negative. TSH normal. Chemistry panel on admission low calcium 8.3, low sodium 125. Drug screen all negative. CBC unremarkable. Urinalysis unremarkable. Today repeat chemistries showed the sodium is normalized at 135.   ASSESSMENT: A 64 year old woman with schizoaffective or personality disorder, chronic condition. To my evaluation today she appears very much at her baseline. Her current symptoms and type of complaints are consistent with how she has been when I have worked with her before, even at her best. There is no sign currently that she is actively suicidal or homicidal nor that she is acutely  psychotic. The patient is able to make decisions for herself. No indication that she meets commitment criteria. Does not need psychiatric hospitalization. The patient is largely I am afraid of placement issue from a psychiatric standpoint.   TREATMENT PLAN: Continue current medicines. We will follow up as needed in the hospital. Did some counseling work with the patient about trying to work on compromising to find a safe place to live. Case discussed with nursing and hospitalist.   DIAGNOSIS, PRINCIPAL AND PRIMARY:  AXIS I: Bipolar disorder, depressed, mild.   SECONDARY DIAGNOSES:  AXIS I: Deferred.  AXIS II: Dependent and paranoid personality traits.  AXIS III: Upper respiratory infection, hypothyroidism, high blood pressure.    ____________________________ Gonzella Lex, MD jtc:ts D: 04/15/2014 13:11:27 ET T: 04/15/2014 13:23:54 ET JOB#: [id number]  cc: Gonzella Lex, MD, <Dictator> Gonzella Lex MD ELECTRONICALLY SIGNED 04/21/2014 22:33

## 2014-05-17 NOTE — H&P (Signed)
PATIENT NAME:  Angela [name], Angela [name] MR#:  [id number] DATE OF BIRTH:  [birth year]  DATE OF ADMISSION:  04/14/2014  ADMITTING PHYSICIAN: Gladstone Lighter, MD   PRIMARY CARE PHYSICIAN: Irven Easterly. Kary Kos, MD   CHIEF COMPLAINT: Sent in from group home for recent possible pneumonia.   HISTORY OF PRESENT ILLNESS: [name] is a 64 year old Caucasian female with past medical history significant for bipolar disorder, personality disorder, depression, PTSD, chronic bronchitis, gastroesophageal reflex disease, hiatal, who was sent in from group home with a recent urgent care visit and persistent cough symptoms. The patient states that she was at this nursing home called [facility], which is a new group home and has been discharged off from there and disposed of in the ER. She complains of nausea, cough, productive with sputum, but states she also has chronic bronchitis. She states her PCP always places her on 1000 mg of Biaxin because of multiple antibiotic allergies, but when she went to urgent care the other day, she was only put on low-dose Biaxin and that is why she is very paranoid that her symptoms are not better. She had only a low-grade temperature in the ear of 99.6 degrees Fahrenheit, and her chest x-ray is clear. She complains of chills and low-grade fevers. She has chest pain from constant coughing. When labs were done, her sodium was noted to be 125 and so she is being admitted.   PAST MEDICAL HISTORY: 1. Gastroesophageal reflex disease.  2. Hiatal hernia.  3. Chronic bronchitis.  4. Bipolar disorder with depression.  5. Borderline personality disorder.  6. PTSD.   PAST SURGICAL HISTORY: 1. Tonsillectomy.  2. Uterine suspension surgery.  3. Inguinal hernia and ventral hernia repair.   ALLERGIES TO MEDICATIONS: SHE IS ALLERGIC TO ALKASELTZER, DARVON, EPINEPHRINE, HALDOL, PENICILLIN, PERCOCET, PREDNISONE, PROZAC, SULFA DRUGS, ULTRAM, LEVAQUIN WHICH CAUSES TENDINITIS.   CURRENT HOME  MEDICATIONS:  1. Norco 5/300 mg 1/2 tablet to 1 tablet every 6 hours as needed for pain.  2. Ambien 10 mg p.o. at bedtime.  3. Ammonium lactate 12% topical cream once a day after shower.  4. Anusol rectal cream 2.5% 3 times a day.  5. Azelastine nostril once a day as needed.  6. Benadryl 25 mg use p.o. t.i.d. as needed.  7. Biaxin 500 mg p.o. b.i.d.  8. Biotin 1 capsule p.o. daily.  9. Calcium carbonate 600 mg p.o. t.i.d.  10. Ciprodex 0.3% to 0.1% otic suspension 4 drops each ear twice a day.  11. Citrucel 1000 mg p.o. daily.  12. Colace 200 mg p.o. b.i.d.  13. Depakote ER 250 mg p.o. daily.  14. Depakote ER 500 mg p.o. at bedtime.  15. EpiPen Pak injectable for allergies.  16. Flonase nasal spray 2 sprays each nostril daily as needed.  17. Lamisil topical 1% ointment to the affected area twice a day.  18. Synthroid 88 mcg p.o. daily.  19. Linzess 290 mcg capsule daily.  20. Magnesium citrate 30 mL once a day for constipation.  21. Milk of magnesia 15 mL once a day at bedtime.  22. Multivitamin 1 tablet p.o. daily.  23. Norvasc 2.5 mg p.o. daily.  24. Paxil 40 mg p.o. daily.  25. Premarin vaginal cream 10.625 mg/g vaginal twice a week.  26. Probiotic capsules daily.  27. Protonix 40 mg p.o. b.i.d.  28. Risperdal 0.5 mg p.o. b.i.d.  29. Tizanidine 4 mg p.o. daily p.r.n.  30. Triamcinolone 0.1% topical cream 3 times a day.  31. Tussin 5 mL every  4 hours as needed for cough.  32. Vitamin D 3000 international units p.o. daily.  33. Zyrtec 10 mg p.o. daily.   SOCIAL HISTORY: Has been a resident of a group home. She says she is not married, does not have children. All her family is up [place]. Denies any smoking or alcohol use.   FAMILY HISTORY: Significant for heart disease in the family.   REVIEW OF SYSTEMS:  CONSTITUTIONAL: Positive for fever, fatigue, and weakness.  EYES: No blurred vision, double vision, inflammation, or glaucoma.  EARS: No tinnitus, ear pain, hearing loss,  epistaxis, or discharge.  RESPIRATORY: Positive for cough and COPD. No wheezing, hemoptysis, or painful respiration.  CARDIOVASCULAR: Positive for chest pain. No orthopnea, edema, arrhythmia, palpitations, or syncope.  GASTROINTESTINAL: Positive for nausea. No vomiting, diarrhea, abdominal pain, hematemesis, or melena. Positive for constipation chronically.  GENITOURINARY: No dysuria, hematuria, increased frequency is present.  ENDOCRINE: No polyuria, nocturia, thyroid problems, heat or cold intolerance.  HEMATOLOGY: No anemia, easy bruising, or bleeding.  SKIN: No acne, rash, or lesions.  MUSCULOSKELETAL: No neck fracture, pain, arthritis, or gout.  NEUROLOGICAL: No numbness, weakness, CVA, TIA, or seizures. PSHCOLOGICAL: The patient has depression, anxiety, and insomnia.    PHYSICAL EXAMINATION: VITAL SIGNS: Temperature 99.6 degrees Fahrenheit, pulse 78, respirations 20, blood pressure 112/72, pulse of 95% on room air.  GENERAL: Well-built, well-nourished female, disheveled appearing, not in any acute distress.  HEENT: Normocephalic, atraumatic. Pupils equal, round, reacting to light. Anicteric sclerae. Extraocular movements intact. Oropharynx clear without erythema, mass, or exudates.  NECK: Supple. No thyromegaly, JVD, or carotid bruits. No lymphadenopathy.  LUNGS: Moving air bilaterally. No wheezes or crackles. No use of accessory muscles for breathing.  CARDIOVASCULAR: S1, S2, regular rate and rhythm. No murmurs, rubs, or gallops.  ABDOMEN: Soft, nontender, nondistended. No hepatosplenomegaly. Normal bowel sounds.  EXTREMITIES: No pedal edema. No clubbing or cyanosis, 2+ dorsalis pedis pulses palpable bilaterally.  SKIN: No acne, rash or lesions.  LYMPHATICS: No cervical or inguinal lymphadenopathy.   NEUROLOGIC: Cranial nerves intact. No focal motor or sensory deficits.  PSYCHIATRIC: The patient is awake, alert, oriented x 3.   LABORATORY DATA: WBC 4.7, hemoglobin 12.0, hematocrit  35.1, platelet count is 226,000. Sodium 125, potassium 4.0, chloride 90, bicarbonate 24, BUN 6, creatinine 0.64, glucose 98, and calcium of 8.3. ALT 17, AST 31, alkaline phosphatase 64, total bilirubin 0.1, albumin of 3.8. Urinalysis is negative for any infection. Urine toxicology screen is negative. Chest x-ray showing hiatal hernia, no acute cardiopulmonary disease. Serum alcohol level, serum acetaminophen, and salicylate levels are negative.   ASSESSMENT AND PLAN: A 64 year old female with chronic bronchitis, gastroesophageal reflux disease, bipolar disorder sent in from group home for her cough and weakness.  1. Hyponatremia, hypokalemia. Could be dehydration. Started IV fluids. Recheck in the a.m. Check serum and urine osmolarity to rule out primary polydipsia, as she says she drinks a lot of water and is feeling thirsty all the time.  2. Chronic bronchitis and cough. Wants to be on antibiotics, but chest x-ray is clear. Added cough medicines. Will finish her Biaxin course. The patient says she is allergic to multiple antibiotics.  3. Bipolar with depression, posttraumatic stress disorder. Psychiatric consult. Continue home medications. Will need a Education officer, museum consult for disposition, as is homeless as of this moment.   4. Gastroesophageal reflux disease. Continue her PPI b.i.d.  5. Deep vein thrombosis prophylaxis with Lovenox.Marland Kitchen   CODE STATUS: Full code.   TIME SPENT ON ADMISSION: 50 minutes.  ____________________________ Gladstone Lighter, MD rk:mw D: 04/14/2014 20:02:51 ET T: 04/14/2014 20:31:27 ET JOB#: [id number]  cc: Gladstone Lighter, MD, <Dictator> Irven Easterly. Kary Kos, MD Gladstone Lighter MD ELECTRONICALLY SIGNED 04/23/2014 12:24

## 2014-05-26 ENCOUNTER — Encounter: Payer: Self-pay | Admitting: Medical Oncology

## 2014-05-26 ENCOUNTER — Emergency Department
Admission: EM | Admit: 2014-05-26 | Discharge: 2014-05-27 | Disposition: A | Discharge status: Home / Self Care | Payer: Medicare Other | Attending: Emergency Medicine | Admitting: Emergency Medicine

## 2014-05-26 DIAGNOSIS — R1084 Generalized abdominal pain: Secondary | ICD-10-CM

## 2014-05-26 DIAGNOSIS — Z79899 Other long term (current) drug therapy: Secondary | ICD-10-CM | POA: Diagnosis not present

## 2014-05-26 DIAGNOSIS — Z7952 Long term (current) use of systemic steroids: Secondary | ICD-10-CM | POA: Insufficient documentation

## 2014-05-26 DIAGNOSIS — I10 Essential (primary) hypertension: Secondary | ICD-10-CM | POA: Insufficient documentation

## 2014-05-26 DIAGNOSIS — Z880 Allergy status to penicillin: Secondary | ICD-10-CM | POA: Insufficient documentation

## 2014-05-26 DIAGNOSIS — K5900 Constipation, unspecified: Secondary | ICD-10-CM | POA: Insufficient documentation

## 2014-05-26 DIAGNOSIS — Z87891 Personal history of nicotine dependence: Secondary | ICD-10-CM | POA: Diagnosis not present

## 2014-05-26 DIAGNOSIS — R5383 Other fatigue: Secondary | ICD-10-CM | POA: Insufficient documentation

## 2014-05-26 DIAGNOSIS — Z7951 Long term (current) use of inhaled steroids: Secondary | ICD-10-CM | POA: Diagnosis not present

## 2014-05-26 DIAGNOSIS — R109 Unspecified abdominal pain: Secondary | ICD-10-CM | POA: Diagnosis present

## 2014-05-26 LAB — COMPREHENSIVE METABOLIC PANEL
ALT: 9 U/L — AB (ref 14–54)
AST: 23 U/L (ref 15–41)
Albumin: 3.7 g/dL (ref 3.5–5.0)
Alkaline Phosphatase: 61 U/L (ref 38–126)
Anion gap: 8 (ref 5–15)
BUN: 13 mg/dL (ref 6–20)
CO2: 26 mmol/L (ref 22–32)
Calcium: 8.8 mg/dL — ABNORMAL LOW (ref 8.9–10.3)
Chloride: 99 mmol/L — ABNORMAL LOW (ref 101–111)
Creatinine, Ser: 0.76 mg/dL (ref 0.44–1.00)
GFR calc Af Amer: >60 mL/min (ref >60)
Glucose, Bld: 143 mg/dL — ABNORMAL HIGH (ref 65–99)
POTASSIUM: 3.6 mmol/L (ref 3.5–5.1)
SODIUM: 133 mmol/L — AB (ref 135–145)
TOTAL PROTEIN: 6.7 g/dL (ref 6.5–8.1)
Total Bilirubin: 0.4 mg/dL (ref 0.3–1.2)

## 2014-05-26 LAB — URINALYSIS COMPLETE WITH MICROSCOPIC (ARMC ONLY)
Bacteria, UA: NONE SEEN
Bilirubin Urine: NEGATIVE
Glucose, UA: NEGATIVE mg/dL
Hgb urine dipstick: NEGATIVE
LEUKOCYTES UA: NEGATIVE
NITRITE: NEGATIVE
Protein, ur: NEGATIVE mg/dL
Specific Gravity, Urine: 1.018 (ref 1.005–1.030)
pH: 7 (ref 5.0–8.0)

## 2014-05-26 LAB — CBC WITH DIFFERENTIAL/PLATELET
BASOS PCT: 0 %
Basophils Absolute: 0 10*3/uL (ref 0–0.1)
Eosinophils Absolute: 0.1 10*3/uL (ref 0–0.7)
Eosinophils Relative: 2 %
HEMATOCRIT: 36.1 % (ref 35.0–47.0)
HEMOGLOBIN: 12.3 g/dL (ref 12.0–16.0)
LYMPHS PCT: 24 %
Lymphs Abs: 1.8 10*3/uL (ref 1.0–3.6)
MCH: 31.9 pg (ref 26.0–34.0)
MCHC: 33.9 g/dL (ref 32.0–36.0)
MCV: 94 fL (ref 80.0–100.0)
MONO ABS: 0.6 10*3/uL (ref 0.2–0.9)
MONOS PCT: 9 %
NEUTROS ABS: 5 10*3/uL (ref 1.4–6.5)
NEUTROS PCT: 65 %
Platelets: 247 10*3/uL (ref 150–440)
RBC: 3.84 MIL/uL (ref 3.80–5.20)
RDW: 12.9 % (ref 11.5–14.5)
WBC: 7.6 10*3/uL (ref 3.6–11.0)

## 2014-05-26 NOTE — ED Notes (Signed)
Pt ambulatory to triage with reports of lower abd cramping with constipation and gas x 2 weeks.

## 2014-05-26 NOTE — ED Provider Notes (Signed)
[facility] Emergency Department Provider Note  ____________________________________________  Time seen: Approximately 11:35 PM  I have reviewed the triage vital signs and the nursing notes.   HISTORY  Chief Complaint Abdominal Pain    HPI Angela [name] is a 63 y.o. female who has a history of chronic constipation. The patient reports that she was admitted one month ago with pneumonia. She reports that when she was discharged she did not receive prescriptions for her linzess and her probiotic.The patient reports that since then she has been having difficulty with bowel movements. She reports that she has been taking milk of magnesia and prune juice but has still not been having very good bowel movements. The patient reports that she has been having significant abdominal cramping and dizziness. She reports that he she has not felt well since she was discharged from the hospital month ago due to the constipation. She reports that she has been having decreased appetite as she is to fall and unable to empty whenever she does have a bowel movement. He reports that she feels bloated and gassy. She reports that she is still passing gas and small balls of stool but not a significant bowel movement. She reports that her last bowel movement of significance was over a week ago. The patient reports that she has seen her doctor since her admission but she did not receive a prescription for her medication. She reports that since she lives in a group home she is unable to call the doctor to ask for medication prescriptions. She reports that her abdominal pain as a 7 out of 10 in intensity and is going down the sides of her abdomen. She reports that she has been nauseous with no vomiting she has had some fatigue and dizziness as well as some headache.   Past Medical History  Diagnosis Date  . Endometriosis   . Hypothyroidism   . GERD (gastroesophageal reflux disease)   . Bipolar 1  disorder   . Hypertension   . skin cancer     There are no active problems to display for this patient.   Past Surgical History  Procedure Laterality Date  . Tonsillectomy    . Uteral suspension    . Appendectomy    . Hernia repair    . Laproscopy    . Cholecystectomy, laparoscopic      Current Outpatient Rx  Name  Route  Sig  Dispense  Refill  . amLODipine (NORVASC) 2.5 MG tablet   Oral   Take 2.5 mg by mouth daily.         . Azelastine-Fluticasone (DYMISTA NA)   Nasal   Place 1 spray into the nose as needed.          . Biotin 5000 MCG CAPS   Oral   Take 1 capsule by mouth daily.          . calcium-vitamin D (OSCAL WITH D) 500-200 MG-UNIT per tablet   Oral   Take 1 tablet by mouth daily with breakfast.          . cholecalciferol (VITAMIN D) 1000 UNITS tablet   Oral   Take 1,000 Units by mouth daily.         . clarithromycin (BIAXIN) 500 MG tablet   Oral   Take 500 mg by mouth 2 (two) times daily.         . clobetasol ointment (TEMOVATE) 0.05 %   Topical   Apply 1 application topically 2 (  two) times daily. Patient may self administer  Use for 2 weeks, then stop for 2 weeks, and may resume for another 2 weeks if sx persist.   30 g   1   . conjugated estrogens (PREMARIN) vaginal cream   Vaginal   Place 1 Applicatorful vaginally daily.         . cycloSPORINE (RESTASIS) 0.05 % ophthalmic emulsion      1 drop 2 (two) times daily.         . divalproex (DEPAKOTE) 500 MG DR tablet   Oral   Take 500 mg by mouth 3 (three) times daily.         Marland Kitchen docusate sodium (COLACE) 100 MG capsule   Oral   Take 100 mg by mouth 2 (two) times daily.         . fexofenadine (ALLEGRA) 180 MG tablet   Oral   Take 180 mg by mouth daily.         . fluconazole (DIFLUCAN) 150 MG tablet               . fluticasone (FLONASE) 50 MCG/ACT nasal spray               . HYDROcodone-acetaminophen (NORCO/VICODIN) 5-325 MG per tablet   Oral   Take 1  tablet by mouth every 6 (six) hours as needed for moderate pain.   5 tablet   0   . hydrocortisone (ANUSOL-HC) 2.5 % rectal cream   Rectal   Place 1 application rectally 2 (two) times daily.         Marland Kitchen lactulose (CHRONULAC) 10 GM/15ML solution   Oral   Take 30 mLs (20 g total) by mouth 2 (two) times daily.   240 mL   0   . levofloxacin (LEVAQUIN) 500 MG tablet               . levothyroxine (SYNTHROID, LEVOTHROID) 88 MCG tablet   Oral   Take 88 mcg by mouth daily before breakfast.         . Linaclotide (LINZESS PO)   Oral   Take by mouth.         . magnesium sulfate (EPSOM SALTS) crystals      Use 1/2 cup X one quart warm water daily.   1816 g   3   . multivitamin-iron-minerals-folic acid (THERAPEUTIC-M) TABS tablet   Oral   Take 1 tablet by mouth daily.         . pantoprazole (PROTONIX) 40 MG tablet   Oral   Take 40 mg by mouth daily.         Marland Kitchen PARoxetine (PAXIL) 30 MG tablet   Oral   Take 30 mg by mouth daily.         . risperiDONE (RISPERDAL) 2 MG tablet   Oral   Take 2 mg by mouth at bedtime.         Marland Kitchen terconazole (TERAZOL 7) 0.4 % vaginal cream   Vaginal   Place 1 applicator vaginally at bedtime.         Marland Kitchen zolpidem (AMBIEN) 10 MG tablet   Oral   Take 10 mg by mouth at bedtime as needed for sleep.           Allergies Darvon; Peanuts; Penicillins; Percocet; Sulfa antibiotics; Amikacin; Aspirin; Prednisone; Sulfur; and Ultram  No family history on file.  Social History History  Substance Use Topics  . Smoking status: Former Research scientist (life sciences)  . Smokeless tobacco:  Not on file  . Alcohol Use: No    Review of Systems Constitutional: Positive for fatigue No fever/chills Eyes: No visual changes. ENT: No sore throat. Cardiovascular: Denies chest pain. Respiratory: Denies shortness of breath. Gastrointestinal: abdominal pain.  nausea, no vomiting.   constipation. Genitourinary: Negative for dysuria. Musculoskeletal:  back pain. Skin:  Negative for rash. Neurological: Dizziness. Psychiatric:Denies anxiety or depression 10-point ROS otherwise negative.  ____________________________________________   PHYSICAL EXAM:  VITAL SIGNS: ED Triage Vitals  Enc Vitals Group     BP 05/26/14 1836 135/82 mmHg     Pulse Rate 05/26/14 1836 68     Resp 05/26/14 1836 18     Temp 05/26/14 1836 98.2 F (36.8 C)     Temp Source 05/26/14 1836 Oral     SpO2 05/26/14 1836 100 %     Weight 05/26/14 1836 124 lb (56.246 kg)     Height 05/26/14 1836 5\' 3"  (1.6 m)     Head Cir --      Peak Flow --      Pain Score 05/26/14 1837 7     Pain Loc --      Pain Edu? --      Excl. in [place]? --     Constitutional: Alert and oriented. Well appearing and in mild distress. Eyes: Conjunctivae are normal. PERRL. EOMI. Head: Atraumatic. Nose: No congestion/rhinnorhea. Mouth/Throat: Mucous membranes are moist.  Oropharynx non-erythematous. Cardiovascular: Normal rate, regular rhythm. Grossly normal heart sounds.  Good peripheral circulation. Respiratory: Normal respiratory effort.  No retractions. Lungs CTAB. Gastrointestinal: Soft and diffusely tender. No distention. Positive but decreased bowel sounds Genitourinary: Deferred Musculoskeletal: No lower extremity tenderness nor edema.   Neurologic:  Normal speech and language. No gross focal neurologic deficits are appreciated. Speech is normal. No gait instability. Skin:  Skin is warm, dry and intact. No rash noted. Psychiatric: Mood and affect are normal. Speech and behavior are normal.  ____________________________________________   LABS (all labs ordered are listed, but only abnormal results are displayed)  Labs Reviewed  COMPREHENSIVE METABOLIC PANEL - Abnormal; Notable for the following:    Sodium 133 (*)    Chloride 99 (*)    Glucose, Bld 143 (*)    Calcium 8.8 (*)    ALT 9 (*)    All other components within normal limits  URINALYSIS COMPLETEWITH MICROSCOPIC ([facility])  - Abnormal;  Notable for the following:    Color, Urine YELLOW (*)    APPearance HAZY (*)    Ketones, ur TRACE (*)    Squamous Epithelial / LPF 0-5 (*)    All other components within normal limits  CBC WITH DIFFERENTIAL/PLATELET   ____________________________________________  EKG  None ____________________________________________  RADIOLOGY  CT abdomen and pelvis: Moderate bile duct or limitation, slightly increased from 08/04/2013, large hiatal hernia, diverticulosis, lower lumbar facet arthritis and mild spondylolisthesis ____________________________________________   PROCEDURES  Procedure(s) performed: None  Critical Care performed: No  ____________________________________________   INITIAL IMPRESSION / ASSESSMENT AND PLAN / ED COURSE  Pertinent labs & imaging results that were available during my care of the patient were reviewed by me and considered in my medical decision making (see chart for details).  This is a 64 year old female who comes in with some abdominal pain nausea and decreased by mouth intake which she is concern for constipation. Given the patient's age and her pain I will do a CT scan to evaluate for any other cause of her pain. I will also attempt to give the  patient some fluids and treat the patient once the results of the CT scan has returned.  The patient does not have any signs of obstruction or diverticulitis on her exam. I will discharge the patient home with some lactulose to help treat her constipation as well as encourage her to follow up with her physician who can write her appropriate medications. The patient does not have a significant stool burden on the CT scan. ____________________________________________   FINAL CLINICAL IMPRESSION(S) / ED DIAGNOSES  Final diagnoses:  Generalized abdominal pain  Constipation, unspecified constipation type      Loney Hering, MD 05/27/14 [phone]

## 2014-05-26 NOTE — ED Notes (Signed)
ED Provider at patient bedside for assessment/update.   

## 2014-05-27 ENCOUNTER — Emergency Department: Payer: Medicare Other

## 2014-05-27 ENCOUNTER — Encounter: Payer: Self-pay | Admitting: *Deleted

## 2014-05-27 DIAGNOSIS — K5900 Constipation, unspecified: Secondary | ICD-10-CM | POA: Diagnosis not present

## 2014-05-27 MED ORDER — LACTULOSE 10 GM/15ML PO SOLN
20.0000 g | Freq: Two times a day (BID) | ORAL | Status: AC
Start: 2014-05-27 — End: 2014-06-02

## 2014-05-27 MED ORDER — IOHEXOL 300 MG/ML  SOLN
100.0000 mL | Freq: Once | INTRAMUSCULAR | Status: AC | PRN
  Administered 2014-05-27: 100 mL via INTRAVENOUS

## 2014-05-27 NOTE — ED Notes (Signed)
Patient with no complaints at this time. Respirations even and unlabored. Skin warm/dry. Discharge instructions reviewed with patient at this time. Patient given opportunity to voice concerns/ask questions. IV removed per policy and band-aid applied to site. Patient discharged at this time and left Emergency Department with steady gait.  

## 2014-05-27 NOTE — Discharge Instructions (Signed)
Abdominal Pain °Many things can cause abdominal pain. Usually, abdominal pain is not caused by a disease and will improve without treatment. It can often be observed and treated at home. Your health care provider will do a physical exam and possibly order blood tests and X-rays to help determine the seriousness of your pain. However, in many cases, more time must pass before a clear cause of the pain can be found. Before that point, your health care provider may not know if you need more testing or further treatment. °HOME CARE INSTRUCTIONS  °Monitor your abdominal pain for any changes. The following actions may help to alleviate any discomfort you are experiencing: °· Only take over-the-counter or prescription medicines as directed by your health care provider. °· Do not take laxatives unless directed to do so by your health care provider. °· Try a clear liquid diet (broth, tea, or water) as directed by your health care provider. Slowly move to a bland diet as tolerated. °SEEK MEDICAL CARE IF: °· You have unexplained abdominal pain. °· You have abdominal pain associated with nausea or diarrhea. °· You have pain when you urinate or have a bowel movement. °· You experience abdominal pain that wakes you in the night. °· You have abdominal pain that is worsened or improved by eating food. °· You have abdominal pain that is worsened with eating fatty foods. °· You have a fever. °SEEK IMMEDIATE MEDICAL CARE IF:  °· Your pain does not go away within 2 hours. °· You keep throwing up (vomiting). °· Your pain is felt only in portions of the abdomen, such as the right side or the left lower portion of the abdomen. °· You pass bloody or black tarry stools. °MAKE SURE YOU: °· Understand these instructions.   °· Will watch your condition.   °· Will get help right away if you are not doing well or get worse.   °Document Released: 10/12/2004 Document Revised: 01/07/2013 Document Reviewed: 09/11/2012 °[employer]® Patient Information  ©2015 [employer]. This information is not intended to replace advice given to you by your health care provider. Make sure you discuss any questions you have with your health care provider. ° °Constipation °Constipation is when a person has fewer than three bowel movements a week, has difficulty having a bowel movement, or has stools that are dry, hard, or larger than normal. As people grow older, constipation is more common. If you try to fix constipation with medicines that make you have a bowel movement (laxatives), the problem may get worse. Long-term laxative use may cause the muscles of the colon to become weak. A low-fiber diet, not taking in enough fluids, and taking certain medicines may make constipation worse.  °CAUSES  °· Certain medicines, such as antidepressants, pain medicine, iron supplements, antacids, and water pills.   °· Certain diseases, such as diabetes, irritable bowel syndrome (IBS), thyroid disease, or depression.   °· Not drinking enough water.   °· Not eating enough fiber-rich foods.   °· Stress or travel.   °· Lack of physical activity or exercise.   °· Ignoring the urge to have a bowel movement.   °· Using laxatives too much.   °SIGNS AND SYMPTOMS  °· Having fewer than three bowel movements a week.   °· Straining to have a bowel movement.   °· Having stools that are hard, dry, or larger than normal.   °· Feeling full or bloated.   °· Pain in the lower abdomen.   °· Not feeling relief after having a bowel movement.   °DIAGNOSIS  °Your health care provider will take   a medical history and perform a physical exam. Further testing may be done for severe constipation. Some tests may include: °· A barium enema X-ray to examine your rectum, colon, and, sometimes, your small intestine.   °· A sigmoidoscopy to examine your lower colon.   °· A colonoscopy to examine your entire colon. °TREATMENT  °Treatment will depend on the severity of your constipation and what is causing it. Some dietary  treatments include drinking more fluids and eating more fiber-rich foods. Lifestyle treatments may include regular exercise. If these diet and lifestyle recommendations do not help, your health care provider may recommend taking over-the-counter laxative medicines to help you have bowel movements. Prescription medicines may be prescribed if over-the-counter medicines do not work.  °HOME CARE INSTRUCTIONS  °· Eat foods that have a lot of fiber, such as fruits, vegetables, whole grains, and beans. °· Limit foods high in fat and processed sugars, such as french fries, hamburgers, cookies, candies, and soda.   °· A fiber supplement may be added to your diet if you cannot get enough fiber from foods.   °· Drink enough fluids to keep your urine clear or pale yellow.   °· Exercise regularly or as directed by your health care provider.   °· Go to the restroom when you have the urge to go. Do not hold it.   °· Only take over-the-counter or prescription medicines as directed by your health care provider. Do not take other medicines for constipation without talking to your health care provider first.   °SEEK IMMEDIATE MEDICAL CARE IF:  °· You have bright red blood in your stool.   °· Your constipation lasts for more than 4 days or gets worse.   °· You have abdominal or rectal pain.   °· You have thin, pencil-like stools.   °· You have unexplained weight loss. °MAKE SURE YOU:  °· Understand these instructions. °· Will watch your condition. °· Will get help right away if you are not doing well or get worse. °Document Released: 10/01/2003 Document Revised: 01/07/2013 Document Reviewed: 10/14/2012 °[employer]® Patient Information ©2015 [employer]. This information is not intended to replace advice given to you by your health care provider. Make sure you discuss any questions you have with your health care provider. ° °

## 2014-06-03 ENCOUNTER — Encounter: Payer: Self-pay | Admitting: *Deleted

## 2014-06-03 ENCOUNTER — Emergency Department: Payer: Medicare Other

## 2014-06-03 ENCOUNTER — Emergency Department
Admission: EM | Admit: 2014-06-03 | Discharge: 2014-06-03 | Disposition: A | Discharge status: Home / Self Care | Payer: Medicare Other | Attending: Emergency Medicine | Admitting: Emergency Medicine

## 2014-06-03 DIAGNOSIS — Z79899 Other long term (current) drug therapy: Secondary | ICD-10-CM | POA: Diagnosis not present

## 2014-06-03 DIAGNOSIS — K5900 Constipation, unspecified: Secondary | ICD-10-CM | POA: Diagnosis present

## 2014-06-03 DIAGNOSIS — K5732 Diverticulitis of large intestine without perforation or abscess without bleeding: Secondary | ICD-10-CM | POA: Insufficient documentation

## 2014-06-03 DIAGNOSIS — Z87891 Personal history of nicotine dependence: Secondary | ICD-10-CM | POA: Insufficient documentation

## 2014-06-03 DIAGNOSIS — I10 Essential (primary) hypertension: Secondary | ICD-10-CM | POA: Diagnosis not present

## 2014-06-03 DIAGNOSIS — R140 Abdominal distension (gaseous): Secondary | ICD-10-CM

## 2014-06-03 DIAGNOSIS — Z880 Allergy status to penicillin: Secondary | ICD-10-CM | POA: Diagnosis not present

## 2014-06-03 LAB — CBC WITH DIFFERENTIAL/PLATELET
Basophils Absolute: 0 10*3/uL (ref 0–0.1)
Basophils Relative: 1 %
Eosinophils Absolute: 0.1 10*3/uL (ref 0–0.7)
Eosinophils Relative: 2 %
HEMATOCRIT: 34.5 % — AB (ref 35.0–47.0)
HEMOGLOBIN: 11.9 g/dL — AB (ref 12.0–16.0)
LYMPHS ABS: 1.4 10*3/uL (ref 1.0–3.6)
LYMPHS PCT: 30 %
MCH: 31.4 pg (ref 26.0–34.0)
MCHC: 34.4 g/dL (ref 32.0–36.0)
MCV: 91.1 fL (ref 80.0–100.0)
MONO ABS: 0.6 10*3/uL (ref 0.2–0.9)
Monocytes Relative: 14 %
Neutro Abs: 2.4 10*3/uL (ref 1.4–6.5)
Neutrophils Relative %: 53 %
Platelets: 366 10*3/uL (ref 150–440)
RBC: 3.79 MIL/uL — AB (ref 3.80–5.20)
RDW: 12.6 % (ref 11.5–14.5)
WBC: 4.6 10*3/uL (ref 3.6–11.0)

## 2014-06-03 LAB — BASIC METABOLIC PANEL
Anion gap: 7 (ref 5–15)
BUN: 10 mg/dL (ref 6–20)
CHLORIDE: 93 mmol/L — AB (ref 101–111)
CO2: 29 mmol/L (ref 22–32)
Calcium: 8.6 mg/dL — ABNORMAL LOW (ref 8.9–10.3)
Creatinine, Ser: 0.57 mg/dL (ref 0.44–1.00)
GFR calc Af Amer: >60 mL/min (ref >60)
GFR calc non Af Amer: >60 mL/min (ref >60)
Glucose, Bld: 99 mg/dL (ref 65–99)
POTASSIUM: 3.8 mmol/L (ref 3.5–5.1)
Sodium: 129 mmol/L — ABNORMAL LOW (ref 135–145)

## 2014-06-03 MED ORDER — LACTULOSE ENCEPHALOPATHY 10 GM/15ML PO SOLN: 10.0000 g | Freq: Every day | ORAL | Status: DC | PRN

## 2014-06-03 MED ORDER — CIPROFLOXACIN HCL 500 MG PO TABS: 500.0000 mg | ORAL_TABLET | Freq: Two times a day (BID) | ORAL | Status: DC

## 2014-06-03 MED ORDER — IOHEXOL 300 MG/ML  SOLN
100.0000 mL | Freq: Once | INTRAMUSCULAR | Status: AC | PRN
  Administered 2014-06-03: 100 mL via INTRAVENOUS

## 2014-06-03 MED ORDER — CIPROFLOXACIN HCL 500 MG PO TABS
ORAL_TABLET | ORAL | Status: AC
  Administered 2014-06-03: 500 mg via ORAL
  Filled 2014-06-03: qty 1

## 2014-06-03 MED ORDER — METRONIDAZOLE 500 MG PO TABS
ORAL_TABLET | ORAL | Status: AC
  Administered 2014-06-03: 500 mg via ORAL
  Filled 2014-06-03: qty 1

## 2014-06-03 MED ORDER — METRONIDAZOLE 500 MG PO TABS: 500.0000 mg | ORAL_TABLET | Freq: Three times a day (TID) | ORAL | Status: AC

## 2014-06-03 MED ORDER — FLUCONAZOLE 100 MG PO TABS: 150.0000 mg | ORAL_TABLET | Freq: Once | ORAL | Status: AC

## 2014-06-03 MED ORDER — SODIUM CHLORIDE 0.9 % IV BOLUS (SEPSIS)
1000.0000 mL | Freq: Once | INTRAVENOUS | Status: AC
  Administered 2014-06-03: 1000 mL via INTRAVENOUS

## 2014-06-03 MED ORDER — LACTULOSE 10 G PO PACK: 10.0000 g | PACK | Freq: Every day | ORAL | Status: DC | PRN

## 2014-06-03 MED ORDER — CIPROFLOXACIN HCL 500 MG PO TABS
500.0000 mg | ORAL_TABLET | Freq: Once | ORAL | Status: AC
  Administered 2014-06-03: 500 mg via ORAL

## 2014-06-03 MED ORDER — METRONIDAZOLE 500 MG PO TABS
500.0000 mg | ORAL_TABLET | Freq: Once | ORAL | Status: AC
  Administered 2014-06-03: 500 mg via ORAL

## 2014-06-03 NOTE — Discharge Instructions (Signed)
Abdominal Pain °Many things can cause abdominal pain. Usually, abdominal pain is not caused by a disease and will improve without treatment. It can often be observed and treated at home. Your health care provider will do a physical exam and possibly order blood tests and X-rays to help determine the seriousness of your pain. However, in many cases, more time must pass before a clear cause of the pain can be found. Before that point, your health care provider may not know if you need more testing or further treatment. °HOME CARE INSTRUCTIONS  °Monitor your abdominal pain for any changes. The following actions may help to alleviate any discomfort you are experiencing: °· Only take over-the-counter or prescription medicines as directed by your health care provider. °· Do not take laxatives unless directed to do so by your health care provider. °· Try a clear liquid diet (broth, tea, or water) as directed by your health care provider. Slowly move to a bland diet as tolerated. °SEEK MEDICAL CARE IF: °· You have unexplained abdominal pain. °· You have abdominal pain associated with nausea or diarrhea. °· You have pain when you urinate or have a bowel movement. °· You experience abdominal pain that wakes you in the night. °· You have abdominal pain that is worsened or improved by eating food. °· You have abdominal pain that is worsened with eating fatty foods. °· You have a fever. °SEEK IMMEDIATE MEDICAL CARE IF:  °· Your pain does not go away within 2 hours. °· You keep throwing up (vomiting). °· Your pain is felt only in portions of the abdomen, such as the right side or the left lower portion of the abdomen. °· You pass bloody or black tarry stools. °MAKE SURE YOU: °· Understand these instructions.   °· Will watch your condition.   °· Will get help right away if you are not doing well or get worse.   °Document Released: 10/12/2004 Document Revised: 01/07/2013 Document Reviewed: 09/11/2012 °[employer]® Patient Information  ©2015 [employer]. This information is not intended to replace advice given to you by your health care provider. Make sure you discuss any questions you have with your health care provider. ° °Diverticulitis °Diverticulitis is inflammation or infection of small pouches in your colon that form when you have a condition called diverticulosis. The pouches in your colon are called diverticula. Your colon, or large intestine, is where water is absorbed and stool is formed. °Complications of diverticulitis can include: °· Bleeding. °· Severe infection. °· Severe pain. °· Perforation of your colon. °· Obstruction of your colon. °CAUSES  °Diverticulitis is caused by bacteria. °Diverticulitis happens when stool becomes trapped in diverticula. This allows bacteria to grow in the diverticula, which can lead to inflammation and infection. °RISK FACTORS °People with diverticulosis are at risk for diverticulitis. Eating a diet that does not include enough fiber from fruits and vegetables may make diverticulitis more likely to develop. °SYMPTOMS  °Symptoms of diverticulitis may include: °· Abdominal pain and tenderness. The pain is normally located on the left side of the abdomen, but may occur in other areas. °· Fever and chills. °· Bloating. °· Cramping. °· Nausea. °· Vomiting. °· Constipation. °· Diarrhea. °· Blood in your stool. °DIAGNOSIS  °Your health care provider will ask you about your medical history and do a physical exam. You may need to have tests done because many medical conditions can cause the same symptoms as diverticulitis. Tests may include: °· Blood tests. °· Urine tests. °· Imaging tests of the abdomen, including   X-rays and CT scans. °When your condition is under control, your health care provider may recommend that you have a colonoscopy. A colonoscopy can show how severe your diverticula are and whether something else is causing your symptoms. °TREATMENT  °Most cases of diverticulitis are mild and can be  treated at home. Treatment may include: °· Taking over-the-counter pain medicines. °· Following a clear liquid diet. °· Taking antibiotic medicines by mouth for 7-10 days. °More severe cases may be treated at a hospital. Treatment may include: °· Not eating or drinking. °· Taking prescription pain medicine. °· Receiving antibiotic medicines through an IV tube. °· Receiving fluids and nutrition through an IV tube. °· Surgery. °HOME CARE INSTRUCTIONS  °· Follow your health care provider's instructions carefully. °· Follow a full liquid diet or other diet as directed by your health care provider. After your symptoms improve, your health care provider may tell you to change your diet. He or she may recommend you eat a high-fiber diet. Fruits and vegetables are good sources of fiber. Fiber makes it easier to pass stool. °· Take fiber supplements or probiotics as directed by your health care provider. °· Only take medicines as directed by your health care provider. °· Keep all your follow-up appointments. °SEEK MEDICAL CARE IF:  °· Your pain does not improve. °· You have a hard time eating food. °· Your bowel movements do not return to normal. °SEEK IMMEDIATE MEDICAL CARE IF:  °· Your pain becomes worse. °· Your symptoms do not get better. °· Your symptoms suddenly get worse. °· You have a fever. °· You have repeated vomiting. °· You have bloody or black, tarry stools. °MAKE SURE YOU:  °· Understand these instructions. °· Will watch your condition. °· Will get help right away if you are not doing well or get worse. °Document Released: 10/12/2004 Document Revised: 01/07/2013 Document Reviewed: 11/27/2012 °[employer]® Patient Information ©2015 [employer]. This information is not intended to replace advice given to you by your health care provider. Make sure you discuss any questions you have with your health care provider. ° °

## 2014-06-03 NOTE — ED Provider Notes (Signed)
[facility] Emergency Department Provider Note  ____________________________________________  Time seen: Approximately 4:30 PM  I have reviewed the triage vital signs and the nursing notes.   HISTORY  Chief Complaint Fecal Impaction    HPI Angela [name] is a 64 y.o. female with a history of chronic constipation who presents today with worsening constipation and diffuse abdominal pain. The patient says that she has had chronic issues with constipation and was only recently about a days ago started back on her meds. She she believes that she is having worsening constipation because of only recently being started back on her meds after about a month of being off of them. She is complaining of abdominal distention as well as diffuse crampy abdominal pain. She has some associated nausea but no vomiting.She says she is passing "a lot" of gas. Says she has had only about 3 normal bowel movements over the past month and a half. Has had loose stool which she used believes is from her multiple laxatives that she is taking. She says that she also took some MiraLAX just before coming to the emergency department today.   Past Medical History  Diagnosis Date  . Endometriosis   . Hypothyroidism   . GERD (gastroesophageal reflux disease)   . Bipolar 1 disorder   . Hypertension   . skin cancer     There are no active problems to display for this patient.   Past Surgical History  Procedure Laterality Date  . Tonsillectomy    . Uteral suspension    . Appendectomy    . Hernia repair    . Laproscopy    . Cholecystectomy, laparoscopic      Current Outpatient Rx  Name  Route  Sig  Dispense  Refill  . amLODipine (NORVASC) 2.5 MG tablet   Oral   Take 2.5 mg by mouth daily.         . Azelastine-Fluticasone (DYMISTA NA)   Nasal   Place 1 spray into the nose as needed.          . Biotin 5000 MCG CAPS   Oral   Take 1 capsule by mouth daily.          .  calcium-vitamin D (OSCAL WITH D) 500-200 MG-UNIT per tablet   Oral   Take 1 tablet by mouth daily with breakfast.          . cholecalciferol (VITAMIN D) 1000 UNITS tablet   Oral   Take 1,000 Units by mouth daily.         . clarithromycin (BIAXIN) 500 MG tablet   Oral   Take 500 mg by mouth 2 (two) times daily.         . clobetasol ointment (TEMOVATE) 0.05 %   Topical   Apply 1 application topically 2 (two) times daily. Patient may self administer  Use for 2 weeks, then stop for 2 weeks, and may resume for another 2 weeks if sx persist.   30 g   1   . conjugated estrogens (PREMARIN) vaginal cream   Vaginal   Place 1 Applicatorful vaginally daily.         . cycloSPORINE (RESTASIS) 0.05 % ophthalmic emulsion      1 drop 2 (two) times daily.         . divalproex (DEPAKOTE) 500 MG DR tablet   Oral   Take 500 mg by mouth 3 (three) times daily.         Marland Kitchen  docusate sodium (COLACE) 100 MG capsule   Oral   Take 100 mg by mouth 2 (two) times daily.         . fexofenadine (ALLEGRA) 180 MG tablet   Oral   Take 180 mg by mouth daily.         . fluconazole (DIFLUCAN) 150 MG tablet               . fluticasone (FLONASE) 50 MCG/ACT nasal spray               . HYDROcodone-acetaminophen (NORCO/VICODIN) 5-325 MG per tablet   Oral   Take 1 tablet by mouth every 6 (six) hours as needed for moderate pain.   5 tablet   0   . hydrocortisone (ANUSOL-HC) 2.5 % rectal cream   Rectal   Place 1 application rectally 2 (two) times daily.         Marland Kitchen levofloxacin (LEVAQUIN) 500 MG tablet               . levothyroxine (SYNTHROID, LEVOTHROID) 88 MCG tablet   Oral   Take 88 mcg by mouth daily before breakfast.         . Linaclotide (LINZESS PO)   Oral   Take by mouth.         . magnesium sulfate (EPSOM SALTS) crystals      Use 1/2 cup X one quart warm water daily.   1816 g   3   . multivitamin-iron-minerals-folic acid (THERAPEUTIC-M) TABS tablet    Oral   Take 1 tablet by mouth daily.         . pantoprazole (PROTONIX) 40 MG tablet   Oral   Take 40 mg by mouth daily.         Marland Kitchen PARoxetine (PAXIL) 30 MG tablet   Oral   Take 30 mg by mouth daily.         . risperiDONE (RISPERDAL) 2 MG tablet   Oral   Take 2 mg by mouth at bedtime.         Marland Kitchen terconazole (TERAZOL 7) 0.4 % vaginal cream   Vaginal   Place 1 applicator vaginally at bedtime.         Marland Kitchen zolpidem (AMBIEN) 10 MG tablet   Oral   Take 10 mg by mouth at bedtime as needed for sleep.           Allergies Darvon; Peanuts; Penicillins; Percocet; Sulfa antibiotics; Amikacin; Aspirin; Prednisone; Sulfur; and Ultram  No family history on file.  Social History History  Substance Use Topics  . Smoking status: Former Research scientist (life sciences)  . Smokeless tobacco: Not on file  . Alcohol Use: No    Review of Systems Constitutional: No fever/chills Eyes: No visual changes. ENT: No sore throat. Cardiovascular: Denies chest pain. Respiratory: Denies shortness of breath. Gastrointestinal: Diffuse abdominal pain with constipation. Genitourinary: Negative for dysuria. Musculoskeletal: Negative for back pain. Skin: Negative for rash. Neurological: Negative for headaches, focal weakness or numbness.  10-point ROS otherwise negative.  ____________________________________________   PHYSICAL EXAM:  VITAL SIGNS: ED Triage Vitals  Enc Vitals Group     BP 06/03/14 1540 139/98 mmHg     Pulse Rate 06/03/14 1540 81     Resp 06/03/14 1540 20     Temp 06/03/14 1540 97.5 F (36.4 C)     Temp Source 06/03/14 1540 Oral     SpO2 --      Weight 06/03/14 1540 124 lb (56.246 kg)  Height 06/03/14 1540 5\' 3"  (1.6 m)     Head Cir --      Peak Flow --      Pain Score 06/03/14 1541 10     Pain Loc --      Pain Edu? --      Excl. in [place]? --     Constitutional: Alert and oriented. Well appearing and in no acute distress. Eyes: Conjunctivae are normal. PERRL. EOMI. Head:  Atraumatic. Nose: No congestion/rhinnorhea. Mouth/Throat: Mucous membranes are moist.  Oropharynx non-erythematous. Neck: No stridor.   Cardiovascular: Normal rate, regular rhythm. Grossly normal heart sounds.  Good peripheral circulation. Respiratory: Normal respiratory effort.  No retractions. Lungs CTAB. Gastrointestinal: Mild to moderate distention. The belly is soft with mild tenderness diffusely. There is no rebound or guarding. The abdomen is not rigid. The patient does not wince or retract with palpation of the abdomen. Rectal exam without fecal impaction. There is a small amount of brown soft stool on the glove. Musculoskeletal: No lower extremity tenderness nor edema.  No joint effusions. Neurologic:  Normal speech and language. No gross focal neurologic deficits are appreciated. Speech is normal. No gait instability. Skin:  Skin is warm, dry and intact. No rash noted. Psychiatric: Mood and affect are normal. Speech and behavior are normal.  ____________________________________________   LABS (all labs ordered are listed, but only abnormal results are displayed)  Labs Reviewed  CBC WITH DIFFERENTIAL/PLATELET - Abnormal; Notable for the following:    RBC 3.79 (*)    Hemoglobin 11.9 (*)    HCT 34.5 (*)    All other components within normal limits  BASIC METABOLIC PANEL - Abnormal; Notable for the following:    Sodium 129 (*)    Chloride 93 (*)    Calcium 8.6 (*)    All other components within normal limits   ____________________________________________  EKG   ____________________________________________  RADIOLOGY  Nonspecific air-fluid levels on x-ray of the abdomen. CT abdomen with diverticulitis versus colitis ____________________________________________   PROCEDURES    ____________________________________________   INITIAL IMPRESSION / ASSESSMENT AND PLAN / ED COURSE  Pertinent labs & imaging results that were available during my care of the patient were  reviewed by me and considered in my medical decision making (see chart for details).  Upon completion of the rectal exam the patient is insistent that I call Dr. Candace Cruise who she says is her GI doctor. I discussed the patient's case with Dr. Candace Cruise who is not familiar with the patient. He says as long as the imaging of the abdomen does not show any acute pathology and the patient is appropriate for discharge he will see her in his office. He requested the patient call for an appointment.  ----------------------------------------- 9:38 PM on 06/03/2014 -----------------------------------------  We'll give patient course of antibiotics for diverticulitis. We'll follow up with Dr. Candace Cruise. Patient requesting lactulose which was given to her upon discharge from her last ER visit. Also requesting Diflucan because has yeast infections when given antibiotics.    ____________________________________________   FINAL CLINICAL IMPRESSION(S) / ED DIAGNOSES  Acute abdominal pain. Acute diverticulitis. Acute constipation. Return visit    Orbie Pyo, MD 06/03/14 2140

## 2014-06-22 ENCOUNTER — Ambulatory Visit (INDEPENDENT_AMBULATORY_CARE_PROVIDER_SITE_OTHER): Payer: Medicare Other | Admitting: Podiatry

## 2014-06-22 ENCOUNTER — Encounter: Payer: Self-pay | Admitting: Podiatry

## 2014-06-22 DIAGNOSIS — M792 Neuralgia and neuritis, unspecified: Secondary | ICD-10-CM | POA: Diagnosis not present

## 2014-06-22 DIAGNOSIS — M2040 Other hammer toe(s) (acquired), unspecified foot: Secondary | ICD-10-CM | POA: Diagnosis not present

## 2014-06-22 DIAGNOSIS — M722 Plantar fascial fibromatosis: Secondary | ICD-10-CM | POA: Diagnosis not present

## 2014-06-22 NOTE — Progress Notes (Signed)
She presents today for follow-up of bursitis plan fasciitis and neuritis to her bilateral heels. She is also complaining of painful hammertoe deformities bilateral. An interdigital tinea pedis.  Objective: Vital signs are stable she is alert and oriented 3. Pulses are palpable bilateral. Hammertoe deformities are rigid in nature. She still has pain on palpation plantar central calcaneal tubercle bilateral. Radiating pains on palpation of this area. Mild interdigital tinea pedis.  Assessment: pain in limb secondary to neuritis of the calcaneus bilateral. Hammertoe deformities.  Plan: Applied a small amount of anti-fungal between her toes. Injected the bilateral heels today with dehydrated alcohol follow-up with her in 1 month for nail debridement and recheck of the neuritis.

## 2014-06-30 DIAGNOSIS — J329 Chronic sinusitis, unspecified: Secondary | ICD-10-CM | POA: Insufficient documentation

## 2014-06-30 DIAGNOSIS — Z85828 Personal history of other malignant neoplasm of skin: Secondary | ICD-10-CM | POA: Insufficient documentation

## 2014-07-17 ENCOUNTER — Other Ambulatory Visit: Payer: Self-pay | Admitting: Family Medicine

## 2014-07-17 DIAGNOSIS — Z1231 Encounter for screening mammogram for malignant neoplasm of breast: Secondary | ICD-10-CM

## 2014-07-29 ENCOUNTER — Ambulatory Visit: Payer: Medicare Other

## 2014-08-03 ENCOUNTER — Ambulatory Visit: Payer: Medicare Other | Admitting: Podiatry

## 2014-08-10 ENCOUNTER — Other Ambulatory Visit: Payer: Self-pay | Admitting: Orthopedic Surgery

## 2014-08-10 ENCOUNTER — Encounter: Payer: Self-pay | Admitting: Podiatry

## 2014-08-10 ENCOUNTER — Ambulatory Visit (INDEPENDENT_AMBULATORY_CARE_PROVIDER_SITE_OTHER): Payer: Medicare Other | Admitting: Podiatry

## 2014-08-10 DIAGNOSIS — B351 Tinea unguium: Secondary | ICD-10-CM

## 2014-08-10 DIAGNOSIS — M792 Neuralgia and neuritis, unspecified: Secondary | ICD-10-CM | POA: Diagnosis not present

## 2014-08-10 DIAGNOSIS — M79676 Pain in unspecified toe(s): Secondary | ICD-10-CM

## 2014-08-10 DIAGNOSIS — M4802 Spinal stenosis, cervical region: Secondary | ICD-10-CM

## 2014-08-10 NOTE — Progress Notes (Signed)
She presents today with a myriad of questions and requests. She is complaining of her bilateral heel pain. She would like for me to write a prescription for insoles and heel cup and tennis shoes. She would also like for me to fill paperwork out regarding [employer] and a reduced rate.  Objective: Vital signs are stable she is alert and oriented 3. Pulses are palpable bilateral. She has pain on palpation medial calcaneal tubercles bilateral.  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 Moderate hammertoe deformities bilateral.  Plan: Injected the bilateral heels today with dehydrated alcohol. Follow up with her in a month if necessary. Filled out all the paperwork that she was requesting.

## 2014-08-12 ENCOUNTER — Ambulatory Visit
Admission: RE | Admit: 2014-08-12 | Discharge: 2014-08-12 | Disposition: A | Discharge status: Home / Self Care | Payer: Medicare Other | Source: Ambulatory Visit | Attending: Orthopedic Surgery | Admitting: Orthopedic Surgery

## 2014-08-12 DIAGNOSIS — M2578 Osteophyte, vertebrae: Secondary | ICD-10-CM | POA: Diagnosis not present

## 2014-08-12 DIAGNOSIS — M47892 Other spondylosis, cervical region: Secondary | ICD-10-CM | POA: Insufficient documentation

## 2014-08-12 DIAGNOSIS — M4802 Spinal stenosis, cervical region: Secondary | ICD-10-CM | POA: Diagnosis present

## 2014-08-18 ENCOUNTER — Ambulatory Visit: Payer: Medicare Other

## 2014-08-27 ENCOUNTER — Encounter: Payer: Self-pay | Admitting: *Deleted

## 2014-08-27 ENCOUNTER — Emergency Department
Admission: EM | Admit: 2014-08-27 | Discharge: 2014-08-27 | Disposition: A | Discharge status: Home / Self Care | Payer: Medicare Other | Attending: Student | Admitting: Student

## 2014-08-27 ENCOUNTER — Other Ambulatory Visit: Payer: Self-pay

## 2014-08-27 ENCOUNTER — Emergency Department: Payer: Medicare Other

## 2014-08-27 DIAGNOSIS — R1084 Generalized abdominal pain: Secondary | ICD-10-CM | POA: Insufficient documentation

## 2014-08-27 DIAGNOSIS — Z87891 Personal history of nicotine dependence: Secondary | ICD-10-CM | POA: Diagnosis not present

## 2014-08-27 DIAGNOSIS — Z79899 Other long term (current) drug therapy: Secondary | ICD-10-CM | POA: Diagnosis not present

## 2014-08-27 DIAGNOSIS — Z792 Long term (current) use of antibiotics: Secondary | ICD-10-CM | POA: Insufficient documentation

## 2014-08-27 DIAGNOSIS — R112 Nausea with vomiting, unspecified: Secondary | ICD-10-CM | POA: Diagnosis not present

## 2014-08-27 DIAGNOSIS — Z79811 Long term (current) use of aromatase inhibitors: Secondary | ICD-10-CM | POA: Diagnosis not present

## 2014-08-27 DIAGNOSIS — Z880 Allergy status to penicillin: Secondary | ICD-10-CM | POA: Diagnosis not present

## 2014-08-27 DIAGNOSIS — R197 Diarrhea, unspecified: Secondary | ICD-10-CM | POA: Insufficient documentation

## 2014-08-27 DIAGNOSIS — I10 Essential (primary) hypertension: Secondary | ICD-10-CM | POA: Diagnosis not present

## 2014-08-27 LAB — COMPREHENSIVE METABOLIC PANEL
ALK PHOS: 64 U/L (ref 38–126)
ALT: 15 U/L (ref 14–54)
AST: 29 U/L (ref 15–41)
Albumin: 3.9 g/dL (ref 3.5–5.0)
Anion gap: 6 (ref 5–15)
BILIRUBIN TOTAL: 0.3 mg/dL (ref 0.3–1.2)
BUN: 10 mg/dL (ref 6–20)
CHLORIDE: 100 mmol/L — AB (ref 101–111)
CO2: 25 mmol/L (ref 22–32)
CREATININE: 0.8 mg/dL (ref 0.44–1.00)
Calcium: 9 mg/dL (ref 8.9–10.3)
GFR calc Af Amer: >60 mL/min (ref >60)
Glucose, Bld: 80 mg/dL (ref 65–99)
POTASSIUM: 3.9 mmol/L (ref 3.5–5.1)
Sodium: 131 mmol/L — ABNORMAL LOW (ref 135–145)
Total Protein: 7.2 g/dL (ref 6.5–8.1)

## 2014-08-27 LAB — CBC
HEMATOCRIT: 36.2 % (ref 35.0–47.0)
Hemoglobin: 12.1 g/dL (ref 12.0–16.0)
MCH: 30.6 pg (ref 26.0–34.0)
MCHC: 33.3 g/dL (ref 32.0–36.0)
MCV: 92 fL (ref 80.0–100.0)
Platelets: 313 10*3/uL (ref 150–440)
RBC: 3.94 MIL/uL (ref 3.80–5.20)
RDW: 14.5 % (ref 11.5–14.5)
WBC: 5 10*3/uL (ref 3.6–11.0)

## 2014-08-27 LAB — URINALYSIS COMPLETE WITH MICROSCOPIC (ARMC ONLY)
Bacteria, UA: NONE SEEN
Bilirubin Urine: NEGATIVE
Glucose, UA: NEGATIVE mg/dL
Hgb urine dipstick: NEGATIVE
KETONES UR: NEGATIVE mg/dL
Leukocytes, UA: NEGATIVE
Nitrite: NEGATIVE
Protein, ur: NEGATIVE mg/dL
SPECIFIC GRAVITY, URINE: 1.006 (ref 1.005–1.030)
pH: 9 — ABNORMAL HIGH (ref 5.0–8.0)

## 2014-08-27 LAB — LIPASE, BLOOD: Lipase: 23 U/L (ref 22–51)

## 2014-08-27 MED ORDER — PROMETHAZINE HCL 12.5 MG PO TABS: 12.5000 mg | ORAL_TABLET | Freq: Four times a day (QID) | ORAL | Status: DC | PRN

## 2014-08-27 MED ORDER — MORPHINE SULFATE 4 MG/ML IJ SOLN
4.0000 mg | Freq: Once | INTRAMUSCULAR | Status: AC
  Administered 2014-08-27: 4 mg via INTRAVENOUS
  Filled 2014-08-27: qty 1

## 2014-08-27 MED ORDER — SODIUM CHLORIDE 0.9 % IV BOLUS (SEPSIS)
1000.0000 mL | Freq: Once | INTRAVENOUS | Status: AC
  Administered 2014-08-27: 1000 mL via INTRAVENOUS

## 2014-08-27 MED ORDER — PROMETHAZINE HCL 25 MG/ML IJ SOLN
12.5000 mg | Freq: Once | INTRAMUSCULAR | Status: AC
  Administered 2014-08-27: 12.5 mg via INTRAVENOUS
  Filled 2014-08-27: qty 1

## 2014-08-27 MED ORDER — IOHEXOL 240 MG/ML SOLN
25.0000 mL | Freq: Once | INTRAMUSCULAR | Status: AC | PRN
  Administered 2014-08-27: 25 mL via ORAL

## 2014-08-27 MED ORDER — IOHEXOL 300 MG/ML  SOLN
100.0000 mL | Freq: Once | INTRAMUSCULAR | Status: AC | PRN
  Administered 2014-08-27: 100 mL via INTRAVENOUS

## 2014-08-27 NOTE — ED Notes (Signed)
Patient transported to CT 

## 2014-08-27 NOTE — ED Notes (Signed)
Pt reports diarrhea, nausea, abdominal pain. Had x-ray done at Dr. Verneda Skill office this am. States she was bloated/distended. Chills. No fever.

## 2014-08-27 NOTE — ED Provider Notes (Addendum)
[facility] Emergency Department Provider Note  ____________________________________________  Time seen: Approximately 3:31 PM  I have reviewed the triage vital signs and the nursing notes.   HISTORY  Chief Complaint Emesis and Diarrhea    HPI Angela [name] is a 64 y.o. female with history of hypertension, bipolar disorder, GERD, multiple abdominal operations including ovarian cyst removal, ureteral suspension, cholecystectomy, appendectomy, hernia repairs who presents with several days nonbloody nonbilious emesis, nonbloody diarrhea, diffuse abdominal pain, gradual onset, constant since onset. No modifying factors. No chest pain or difficulty breathing, no fevers. She was seen by her PCP today and had an x-ray that "didn't look right" so she was sent to the emergency department for evaluation.   Past Medical History  Diagnosis Date  . Endometriosis   . Hypothyroidism   . GERD (gastroesophageal reflux disease)   . Bipolar 1 disorder   . Hypertension   . skin cancer     There are no active problems to display for this patient.   Past Surgical History  Procedure Laterality Date  . Tonsillectomy    . Uteral suspension    . Appendectomy    . Hernia repair    . Laproscopy    . Cholecystectomy, laparoscopic      Current Outpatient Rx  Name  Route  Sig  Dispense  Refill  . amLODipine (NORVASC) 2.5 MG tablet   Oral   Take 2.5 mg by mouth daily.         . Azelastine-Fluticasone (DYMISTA NA)   Nasal   Place 1 spray into the nose as needed.          . Biotin 5000 MCG CAPS   Oral   Take 1 capsule by mouth daily.          . calcium-vitamin D (OSCAL WITH D) 500-200 MG-UNIT per tablet   Oral   Take 1 tablet by mouth daily with breakfast.          . cholecalciferol (VITAMIN D) 1000 UNITS tablet   Oral   Take 1,000 Units by mouth daily.         . ciprofloxacin (CIPRO) 500 MG tablet   Oral   Take 1 tablet (500 mg total) by mouth 2  (two) times daily.   20 tablet   0   . clarithromycin (BIAXIN) 500 MG tablet   Oral   Take 500 mg by mouth 2 (two) times daily.         . clobetasol ointment (TEMOVATE) 0.05 %   Topical   Apply 1 application topically 2 (two) times daily. Patient may self administer  Use for 2 weeks, then stop for 2 weeks, and may resume for another 2 weeks if sx persist.   30 g   1   . conjugated estrogens (PREMARIN) vaginal cream   Vaginal   Place 1 Applicatorful vaginally daily.         . cycloSPORINE (RESTASIS) 0.05 % ophthalmic emulsion      1 drop 2 (two) times daily.         . divalproex (DEPAKOTE) 500 MG DR tablet   Oral   Take 500 mg by mouth 3 (three) times daily.         Marland Kitchen docusate sodium (COLACE) 100 MG capsule   Oral   Take 100 mg by mouth 2 (two) times daily.         . fexofenadine (ALLEGRA) 180 MG tablet   Oral   Take  180 mg by mouth daily.         . fluticasone (FLONASE) 50 MCG/ACT nasal spray               . HYDROcodone-acetaminophen (NORCO/VICODIN) 5-325 MG per tablet   Oral   Take 1 tablet by mouth every 6 (six) hours as needed for moderate pain.   5 tablet   0   . hydrocortisone (ANUSOL-HC) 2.5 % rectal cream   Rectal   Place 1 application rectally 2 (two) times daily.         Marland Kitchen lactulose (CEPHULAC) 10 G packet   Oral   Take 1 packet (10 g total) by mouth daily as needed (constipation).   10 each   0   . levofloxacin (LEVAQUIN) 500 MG tablet               . levothyroxine (SYNTHROID, LEVOTHROID) 88 MCG tablet   Oral   Take 88 mcg by mouth daily before breakfast.         . Linaclotide (LINZESS PO)   Oral   Take by mouth.         . magnesium sulfate (EPSOM SALTS) crystals      Use 1/2 cup X one quart warm water daily.   1816 g   3   . multivitamin-iron-minerals-folic acid (THERAPEUTIC-M) TABS tablet   Oral   Take 1 tablet by mouth daily.         . pantoprazole (PROTONIX) 40 MG tablet   Oral   Take 40 mg by mouth  daily.         Marland Kitchen PARoxetine (PAXIL) 30 MG tablet   Oral   Take 30 mg by mouth daily.         . risperiDONE (RISPERDAL) 2 MG tablet   Oral   Take 2 mg by mouth at bedtime.         Marland Kitchen terconazole (TERAZOL 7) 0.4 % vaginal cream   Vaginal   Place 1 applicator vaginally at bedtime.         Marland Kitchen zolpidem (AMBIEN) 10 MG tablet   Oral   Take 10 mg by mouth at bedtime as needed for sleep.           Allergies Darvon; Peanuts; Penicillins; Percocet; Sulfa antibiotics; Amikacin; Aspirin; Prednisone; Sulfur; and Ultram  No family history on file.  Social History Social History  Substance Use Topics  . Smoking status: Former Research scientist (life sciences)  . Smokeless tobacco: None  . Alcohol Use: No    Review of Systems Constitutional: No fever/chills Eyes: No visual changes. ENT: No sore throat. Cardiovascular: Denies chest pain. Respiratory: Denies shortness of breath. Gastrointestinal: + abdominal pain.  +nausea, + vomiting.  + diarrhea.  No constipation. Genitourinary: Negative for dysuria. Musculoskeletal: Negative for back pain. Skin: Negative for rash. Neurological: Negative for headaches, focal weakness or numbness.  10-point ROS otherwise negative.  ____________________________________________   PHYSICAL EXAM:  VITAL SIGNS: ED Triage Vitals  Enc Vitals Group     BP 08/27/14 1316 142/91 mmHg     Pulse Rate 08/27/14 1316 88     Resp 08/27/14 1316 16     Temp 08/27/14 1316 98.5 F (36.9 C)     Temp Source 08/27/14 1316 Oral     SpO2 08/27/14 1316 98 %     Weight 08/27/14 1316 122 lb (55.339 kg)     Height 08/27/14 1316 5\' 3"  (1.6 m)     Head Cir --  Peak Flow --      Pain Score 08/27/14 1310 7     Pain Loc --      Pain Edu? --      Excl. in [place]? --     Constitutional: Alert and oriented. Nontoxic- appearing and in no acute distress. Eyes: Conjunctivae are normal. PERRL. EOMI. Head: Atraumatic. Nose: No congestion/rhinnorhea. Mouth/Throat: Mucous membranes are  slightly dry.  Oropharynx non-erythematous. Neck: No stridor.   Cardiovascular: Normal rate, regular rhythm. Grossly normal heart sounds.  Good peripheral circulation. Respiratory: Normal respiratory effort.  No retractions. Lungs CTAB. Gastrointestinal: + mildly distended with quiet bowel sounds and mild diffuse tenderness. No CVA tenderness. Genitourinary: deferred Musculoskeletal: No lower extremity tenderness nor edema.  No joint effusions. Neurologic:  Normal speech and language. No gross focal neurologic deficits are appreciated. No gait instability. Skin:  Skin is warm, dry and intact. No rash noted. Psychiatric: Mood and affect are normal. Speech and behavior are normal.  ____________________________________________   LABS (all labs ordered are listed, but only abnormal results are displayed)  Labs Reviewed  COMPREHENSIVE METABOLIC PANEL - Abnormal; Notable for the following:    Sodium 131 (*)    Chloride 100 (*)    All other components within normal limits  URINALYSIS COMPLETEWITH MICROSCOPIC ([facility] ONLY) - Abnormal; Notable for the following:    Color, Urine YELLOW (*)    APPearance CLEAR (*)    pH 9.0 (*)    Squamous Epithelial / LPF 0-5 (*)    All other components within normal limits  LIPASE, BLOOD  CBC   ____________________________________________  EKG  EKG ED ECG REPORT I, Joanne Gavel, the attending physician, personally viewed and interpreted this ECG.   Date: 08/27/2014  EKG Time: 17:35  Rate: 55  Rhythm: sinus bradycardia  Axis: normal  Intervals:none  ST&T Change: No acute ST segment elevation. Normal QTC.  ____________________________________________  RADIOLOGY  CT abdomen and pelvis  IMPRESSION: 1. No acute findings. 2. No evidence of bowel inflammation or obstruction. 3. Chronic findings include changes from cholecystectomy, chronic intern extrahepatic bile duct dilation, a moderate size hiatal hernia, and scattered colonic  diverticula without evidence of diverticulitis.  ____________________________________________   PROCEDURES  Procedure(s) performed: None  Critical Care performed: No  ____________________________________________   INITIAL IMPRESSION / ASSESSMENT AND PLAN / ED COURSE  Pertinent labs & imaging results that were available during my care of the patient were reviewed by me and considered in my medical decision making (see chart for details).  Angela [name] is a 64 y.o. female with history of hypertension, bipolar disorder, GERD, multiple abdominal operations including ovarian cyst removal, ureteral suspension, cholecystectomy, appendectomy, hernia repairs who presents with several days nonbloody nonbilious emesis, nonbloody diarrhea, diffuse abdominal pain. On exam, vital signs stable, she is afebrile. She has mild diffuse abdominal tenderness with distention and given her report of an abnormal abdominal x-ray today, multiple abdominal operations, we'll obtain CT of the abdomen and pelvis to further evaluate the cause of her pain, rule out obstruction or perforation. We'll treat her pain and give antiemetics as well as IV fluids. Reassess for disposition.  ----------------------------------------- 5:40 PM on 08/27/2014 -----------------------------------------  Labs with mild chronic hyponatremia on chart review. Normal cbc. UA not c/w infection. CT abdomen and pelvis negative for any acute intra-abdominal process. She reports significant symptomatic improvement at this time. She has not vomited since arrival to the emergency department. Discussed that symptoms are likely secondary to viral illness. Discussed return precautions, need  for close PCP follow-up and the patient is comfortable with the discharge plan. ____________________________________________   FINAL CLINICAL IMPRESSION(S) / ED DIAGNOSES  Final diagnoses:  Generalized abdominal pain  Non-intractable vomiting with  nausea, vomiting of unspecified type  Diarrhea      Joanne Gavel, MD 08/27/14 1741  Joanne Gavel, MD 08/27/14 4540

## 2014-09-09 ENCOUNTER — Ambulatory Visit: Payer: Medicare Other | Attending: Family Medicine

## 2014-09-14 ENCOUNTER — Ambulatory Visit (INDEPENDENT_AMBULATORY_CARE_PROVIDER_SITE_OTHER): Payer: Medicare Other | Admitting: Podiatry

## 2014-09-14 DIAGNOSIS — G5762 Lesion of plantar nerve, left lower limb: Secondary | ICD-10-CM

## 2014-09-14 DIAGNOSIS — G5761 Lesion of plantar nerve, right lower limb: Secondary | ICD-10-CM

## 2014-09-14 DIAGNOSIS — M722 Plantar fascial fibromatosis: Secondary | ICD-10-CM

## 2014-09-14 DIAGNOSIS — M792 Neuralgia and neuritis, unspecified: Secondary | ICD-10-CM

## 2014-09-14 NOTE — Progress Notes (Signed)
She presents today with chief complaint of painful heels bilateral. She states they do seem to be doing better than they have been previously.  Objective: Vital signs are stable she is alert and oriented 3. She has mild tenderness on palpation medial calcaneal tubercles bilateral moderate severe hammertoe deformities bilateral. No ulcerations noted and wounds no abrasions.  Assessment: Neuritis plantar heel bilateral.  Plan: Dehydrated alcohol injections bilateral heels today and I will follow-up with her in 3 weeks.

## 2014-09-29 ENCOUNTER — Encounter: Payer: Self-pay | Admitting: Emergency Medicine

## 2014-09-29 ENCOUNTER — Emergency Department
Admission: EM | Admit: 2014-09-29 | Discharge: 2014-09-29 | Discharge status: Against Medical Advice | Payer: Medicare Other | Attending: Emergency Medicine | Admitting: Emergency Medicine

## 2014-09-29 DIAGNOSIS — Z87891 Personal history of nicotine dependence: Secondary | ICD-10-CM | POA: Diagnosis not present

## 2014-09-29 DIAGNOSIS — I10 Essential (primary) hypertension: Secondary | ICD-10-CM | POA: Diagnosis not present

## 2014-09-29 DIAGNOSIS — N939 Abnormal uterine and vaginal bleeding, unspecified: Secondary | ICD-10-CM | POA: Insufficient documentation

## 2014-09-29 NOTE — ED Notes (Signed)
Pt with vaginal bleeding on and off  since last Thursday, pt with hx of endometriosis. Unable to see GYN.

## 2014-09-29 NOTE — ED Notes (Signed)
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 Pt understands that this can prolong her wait time but still insists on request. No acute distress noted of pt at time of triage assessment.

## 2014-10-12 ENCOUNTER — Ambulatory Visit: Payer: Medicare Other | Admitting: Podiatry

## 2014-10-16 ENCOUNTER — Ambulatory Visit: Payer: Medicare Other | Admitting: Podiatry

## 2014-10-19 ENCOUNTER — Encounter: Payer: Self-pay | Admitting: Podiatry

## 2014-10-19 ENCOUNTER — Ambulatory Visit (INDEPENDENT_AMBULATORY_CARE_PROVIDER_SITE_OTHER): Payer: Medicare Other | Admitting: Podiatry

## 2014-10-19 VITALS — BP 127/76 | HR 69 | Resp 12

## 2014-10-19 DIAGNOSIS — G5762 Lesion of plantar nerve, left lower limb: Secondary | ICD-10-CM

## 2014-10-19 DIAGNOSIS — G5761 Lesion of plantar nerve, right lower limb: Secondary | ICD-10-CM

## 2014-10-19 DIAGNOSIS — M792 Neuralgia and neuritis, unspecified: Secondary | ICD-10-CM

## 2014-10-19 NOTE — Progress Notes (Signed)
She presents today for chief complaint of bilateral heel pain. She states this and to be doing much better and maybe 1 more injection will help.    Objective : Vital signs stable alert and oriented 3. Pulses are strongly palpable bilateral. She has pain on palpation medial calcaneal tubercles bilateral. Severe hammertoe deformities are noted bilateral.   assessment: plantar fasciitis with neuritis bilateral heels.  Plan: Reinjected dehydrated alcohol today and will follow up with her on an as-needed basis or in 3 weeks.

## 2014-10-30 ENCOUNTER — Encounter: Payer: Self-pay | Admitting: Urology

## 2014-10-30 ENCOUNTER — Ambulatory Visit (INDEPENDENT_AMBULATORY_CARE_PROVIDER_SITE_OTHER): Payer: Medicare Other | Admitting: Urology

## 2014-10-30 VITALS — BP 135/85 | HR 71 | Ht 63.0 in | Wt 125.0 lb

## 2014-10-30 DIAGNOSIS — R102 Pelvic and perineal pain: Secondary | ICD-10-CM | POA: Diagnosis not present

## 2014-10-30 LAB — MICROSCOPIC EXAMINATION
Bacteria, UA: NONE SEEN
Epithelial Cells (non renal): NONE SEEN /hpf (ref 0–10)
RBC, UA: NONE SEEN /hpf (ref 0–?)
Renal Epithel, UA: NONE SEEN /hpf
WBC UA: NONE SEEN /HPF (ref 0–?)

## 2014-10-30 LAB — URINALYSIS, COMPLETE
BILIRUBIN UA: NEGATIVE
Glucose, UA: NEGATIVE
Ketones, UA: NEGATIVE
Leukocytes, UA: NEGATIVE
Nitrite, UA: NEGATIVE
Protein, UA: NEGATIVE
RBC UA: NEGATIVE
Specific Gravity, UA: 1.015 (ref 1.005–1.030)
Urobilinogen, Ur: 0.2 mg/dL (ref 0.2–1.0)
pH, UA: 7.5 (ref 5.0–7.5)

## 2014-10-30 LAB — BLADDER SCAN AMB NON-IMAGING

## 2014-10-30 NOTE — Progress Notes (Signed)
10/30/2014 10:26 AM   Angela [name] [birth year] [id number]  Referring provider: [place] Pink, MD [address] [facility] [place], [place] [address]   HPI:   1 - Female Pelvic Pain - Pt with "feels like menstural cramps" x few mos also with some post-menaupasal vaginal bleeding. Is currently in midst of eval by [name] oncology as well as primary GYN with [facility] [facility] for this. Was suggested to have urol eval by them. She denies hematuria or any relation of these symptoms to full / empty bladder. Four 4 CT scans in past year without renal / pelvic abnormalities (no hydro, stones, large pelvic masses). Pelvic without urethral or bladder localizing pain. PVR "48mL". UA completely clear.  PMH sig for anxiety/depression, chronic pain / narcotics, bipolar 1, appy, chole, TNA, uterine suspension / ovarian cyst removal.   PMH: Past Medical History  Diagnosis Date  . Endometriosis   . Hypothyroidism   . GERD (gastroesophageal reflux disease)   . Bipolar 1 disorder ([place])   . Hypertension   . skin cancer     Surgical History: Past Surgical History  Procedure Laterality Date  . Tonsillectomy    . Uteral suspension    . Appendectomy    . Hernia repair    . Laproscopy    . Cholecystectomy, laparoscopic      Home Medications:    Medication List       This list is accurate as of: 10/30/14 10:26 AM.  Always use your most recent med list.               amLODipine 2.5 MG tablet  Commonly known as:  NORVASC  Take 2.5 mg by mouth daily.     Biotin 5000 MCG Caps  Take 1 capsule by mouth daily.     cholecalciferol 1000 UNITS tablet  Commonly known as:  VITAMIN D  Take 1,000 Units by mouth daily.     ciprofloxacin 500 MG tablet  Commonly known as:  CIPRO  Take 1 tablet (500 mg total) by mouth 2 (two) times daily.     conjugated estrogens vaginal cream  Commonly known as:  PREMARIN  Place 1 Applicatorful vaginally daily.     cycloSPORINE 0.05 % ophthalmic  emulsion  Commonly known as:  RESTASIS  1 drop 2 (two) times daily.     divalproex 500 MG DR tablet  Commonly known as:  DEPAKOTE  Take 500 mg by mouth 3 (three) times daily.     HYDROcodone-acetaminophen 5-325 MG tablet  Commonly known as:  NORCO/VICODIN  Take 1 tablet by mouth every 6 (six) hours as needed for moderate pain.     hydrocortisone 2.5 % rectal cream  Commonly known as:  ANUSOL-HC  Place 1 application rectally 2 (two) times daily.     lactulose 10 G packet  Commonly known as:  CEPHULAC  Take 1 packet (10 g total) by mouth daily as needed (constipation).     levothyroxine 88 MCG tablet  Commonly known as:  SYNTHROID, LEVOTHROID  Take 88 mcg by mouth daily before breakfast.     magnesium sulfate crystals  Commonly known as:  EPSOM SALTS  Use 1/2 cup X one quart warm water daily.     multivitamin-iron-minerals-folic acid Tabs tablet  Take 1 tablet by mouth daily.     pantoprazole 40 MG tablet  Commonly known as:  PROTONIX  Take 40 mg by mouth daily.     PARoxetine 30 MG tablet  Commonly  known as:  PAXIL  Take 30 mg by mouth daily.     promethazine 12.5 MG tablet  Commonly known as:  PHENERGAN  Take 1 tablet (12.5 mg total) by mouth every 6 (six) hours as needed for nausea or vomiting.     terconazole 0.4 % vaginal cream  Commonly known as:  TERAZOL 7  Place 1 applicator vaginally at bedtime.     zolpidem 10 MG tablet  Commonly known as:  AMBIEN  Take 10 mg by mouth at bedtime as needed for sleep.        Allergies:  Allergies  Allergen Reactions  . Darvon [Propoxyphene] Hives    Hives/throat swelling Hives/throat swelling  . Peanuts [Peanut Oil] Anaphylaxis and Hives    Hives/throat swelling  . Penicillins Hives  . Percocet [Oxycodone-Acetaminophen] Swelling, Nausea And Vomiting and Anxiety    hallucinations hallucinations Hives in throat  . Sulfa Antibiotics Hives    Hives/throat swelling  . Amikacin Nausea And Vomiting  . Aspirin Hives   . Prednisone Other (See Comments) and Nausea And Vomiting    Crazy mood swings, strange thoughts Crazy mood swings, strange thoughts  . Sulfur Hives  . Ultram [Tramadol] Hives    Family History: No family history on file.  Social History:  reports that she has quit smoking. She does not have any smokeless tobacco history on file. She reports that she does not drink alcohol or use illicit drugs.  ROS:     Review of Systems  Gastrointestinal (upper)  : Negative for upper GI symptoms  Gastrointestinal (lower) : Negative for lower GI symptoms  Constitutional : Negative for symptoms  Skin: Negative for skin symptoms  Eyes: Negative for eye symptoms  Ear/Nose/Throat : Negative for Ear/Nose/Throat symptoms  Hematologic/Lymphatic: Negative for Hematologic/Lymphatic symptoms  Cardiovascular : Negative for cardiovascular symptoms  Respiratory : Negative for respiratory symptoms  Endocrine: Negative for endocrine symptoms  Musculoskeletal: Negative for musculoskeletal symptoms  Neurological: Negative for neurological symptoms  Psychologic: Negative for psychiatric symptoms        Physical Exam: There were no vitals taken for this visit.  Constitutional:  Alert and oriented, No acute distress. HEENT: [place] AT, moist mucus membranes.  Trachea midline, no masses. Cardiovascular: No clubbing, cyanosis, or edema. Respiratory: Normal respiratory effort, no increased work of breathing. GI: Abdomen is soft, nontender, nondistended, no abdominal masses GU: No CVA tenderness.Mild vaginal atrophy and tiny caruncle as expeted. No CMT. No bladder or urethral pain. Some dicomfort with uterine palpation. Angela [name] as chaprone. Skin: No rashes, bruises or suspicious lesions. Lymph: No cervical or inguinal adenopathy. Neurologic: Grossly intact, no focal deficits, moving all 4 extremities. Psychiatric: Normal mood and affect.  Laboratory Data: Lab Results  Component Value  Date   WBC 5.0 08/27/2014   HGB 12.1 08/27/2014   HCT 36.2 08/27/2014   MCV 92.0 08/27/2014   PLT 313 08/27/2014    Lab Results  Component Value Date   CREATININE 0.80 08/27/2014    No results found for: PSA  No results found for: TESTOSTERONE  No results found for: HGBA1C  Urinalysis    Component Value Date/Time   COLORURINE YELLOW* 08/27/2014 1345   COLORURINE [place] 04/14/2014 1512   APPEARANCEUR CLEAR* 08/27/2014 1345   APPEARANCEUR Clear 04/14/2014 1512   LABSPEC 1.006 08/27/2014 1345   LABSPEC 1.002 04/14/2014 1512   PHURINE 9.0* 08/27/2014 1345   PHURINE 7.0 04/14/2014 1512   GLUCOSEU NEGATIVE 08/27/2014 1345   GLUCOSEU Negative 04/14/2014 1512   HGBUR  NEGATIVE 08/27/2014 1345   HGBUR Negative 04/14/2014 1512   BILIRUBINUR NEGATIVE 08/27/2014 1345   BILIRUBINUR Negative 04/14/2014 1512   [place] 08/27/2014 1345   KETONESUR Negative 04/14/2014 1512   PROTEINUR NEGATIVE 08/27/2014 1345   PROTEINUR Negative 04/14/2014 1512   UROBILINOGEN 0.2 01/15/2013 1820   NITRITE NEGATIVE 08/27/2014 1345   NITRITE Negative 04/14/2014 1512   LEUKOCYTESUR NEGATIVE 08/27/2014 1345   LEUKOCYTESUR Negative 04/14/2014 1512    Pertinent Imaging: Numerous normal abd-pelvic CT's as per HPI  Assessment & Plan:    1 - Female Pelvic Pain - by history and exam this seems clearly related to post-menaupasal bleeding. She has no hematuria, No GU abnormalities by pelvic exam / CT / PVR. PT reassured and STRONGLY encouraged to keep GYN f/u especially with [facility] GYN oncology where her current BX results are pending.    No Follow-up on file.  Alexis Frock, [facility] [address], [place], [place] [address] [phone]

## 2014-11-16 ENCOUNTER — Encounter: Payer: Self-pay | Admitting: Podiatry

## 2014-11-16 ENCOUNTER — Ambulatory Visit (INDEPENDENT_AMBULATORY_CARE_PROVIDER_SITE_OTHER): Payer: Medicare Other | Admitting: Podiatry

## 2014-11-16 DIAGNOSIS — G5762 Lesion of plantar nerve, left lower limb: Secondary | ICD-10-CM

## 2014-11-16 DIAGNOSIS — M792 Neuralgia and neuritis, unspecified: Secondary | ICD-10-CM

## 2014-11-16 DIAGNOSIS — M722 Plantar fascial fibromatosis: Secondary | ICD-10-CM | POA: Diagnosis not present

## 2014-11-16 DIAGNOSIS — G5761 Lesion of plantar nerve, right lower limb: Secondary | ICD-10-CM

## 2014-11-16 NOTE — Progress Notes (Signed)
She presents today for follow-up of pain to her bilateral heels. She states that she's been from [place] to [place] and her feet are killing her. She also states that she feels like her ankles are weak and that she is unstable.  Objective: Vital signs are stable she is alert and oriented 3 pulses are palpable. Severe pain palpation medial calcaneal tubercles bilateral.  Assessment: Fall risk with balance and stability. Neuritis and plantar fasciitis bilateral heels.  Plan: Reinjected dehydrated alcohol to the bilateral heels to alleviate her symptoms for at least 3-4 weeks. Also send her for physical therapy for strengthening of the ankles and balance. Also for the plantar fasciitis.  Alford Highland DPM

## 2014-11-25 ENCOUNTER — Emergency Department: Payer: Medicare Other

## 2014-11-25 ENCOUNTER — Emergency Department
Admission: EM | Admit: 2014-11-25 | Discharge: 2014-11-25 | Disposition: A | Discharge status: Home / Self Care | Payer: Medicare Other | Attending: Emergency Medicine | Admitting: Emergency Medicine

## 2014-11-25 ENCOUNTER — Encounter: Payer: Self-pay | Admitting: Emergency Medicine

## 2014-11-25 DIAGNOSIS — Z880 Allergy status to penicillin: Secondary | ICD-10-CM | POA: Diagnosis not present

## 2014-11-25 DIAGNOSIS — Z87891 Personal history of nicotine dependence: Secondary | ICD-10-CM | POA: Diagnosis not present

## 2014-11-25 DIAGNOSIS — R1084 Generalized abdominal pain: Secondary | ICD-10-CM | POA: Insufficient documentation

## 2014-11-25 DIAGNOSIS — M549 Dorsalgia, unspecified: Secondary | ICD-10-CM | POA: Diagnosis not present

## 2014-11-25 DIAGNOSIS — R110 Nausea: Secondary | ICD-10-CM | POA: Diagnosis not present

## 2014-11-25 DIAGNOSIS — I10 Essential (primary) hypertension: Secondary | ICD-10-CM | POA: Diagnosis not present

## 2014-11-25 DIAGNOSIS — Z79899 Other long term (current) drug therapy: Secondary | ICD-10-CM | POA: Diagnosis not present

## 2014-11-25 DIAGNOSIS — R109 Unspecified abdominal pain: Secondary | ICD-10-CM | POA: Diagnosis present

## 2014-11-25 HISTORY — DX: Diaphragmatic hernia without obstruction or gangrene: K44.9

## 2014-11-25 LAB — CBC
HCT: 35.3 % (ref 35.0–47.0)
Hemoglobin: 12.2 g/dL (ref 12.0–16.0)
MCH: 31.1 pg (ref 26.0–34.0)
MCHC: 34.4 g/dL (ref 32.0–36.0)
MCV: 90.4 fL (ref 80.0–100.0)
Platelets: 326 10*3/uL (ref 150–440)
RBC: 3.91 MIL/uL (ref 3.80–5.20)
RDW: 13.3 % (ref 11.5–14.5)
WBC: 5.3 10*3/uL (ref 3.6–11.0)

## 2014-11-25 LAB — COMPREHENSIVE METABOLIC PANEL
ALBUMIN: 3.5 g/dL (ref 3.5–5.0)
ALT: 11 U/L — ABNORMAL LOW (ref 14–54)
ANION GAP: 6 (ref 5–15)
AST: 23 U/L (ref 15–41)
Alkaline Phosphatase: 66 U/L (ref 38–126)
BUN: 10 mg/dL (ref 6–20)
CHLORIDE: 99 mmol/L — AB (ref 101–111)
CO2: 26 mmol/L (ref 22–32)
Calcium: 8.7 mg/dL — ABNORMAL LOW (ref 8.9–10.3)
Creatinine, Ser: 0.59 mg/dL (ref 0.44–1.00)
GFR calc Af Amer: >60 mL/min (ref >60)
GFR calc non Af Amer: >60 mL/min (ref >60)
GLUCOSE: 107 mg/dL — AB (ref 65–99)
POTASSIUM: 3.8 mmol/L (ref 3.5–5.1)
SODIUM: 131 mmol/L — AB (ref 135–145)
Total Bilirubin: 0.3 mg/dL (ref 0.3–1.2)
Total Protein: 6.5 g/dL (ref 6.5–8.1)

## 2014-11-25 LAB — URINALYSIS COMPLETE WITH MICROSCOPIC (ARMC ONLY)
Bacteria, UA: NONE SEEN
Bilirubin Urine: NEGATIVE
Glucose, UA: NEGATIVE mg/dL
HGB URINE DIPSTICK: NEGATIVE
KETONES UR: NEGATIVE mg/dL
LEUKOCYTES UA: NEGATIVE
NITRITE: NEGATIVE
PH: 8 (ref 5.0–8.0)
PROTEIN: NEGATIVE mg/dL
SPECIFIC GRAVITY, URINE: 1.004 — AB (ref 1.005–1.030)
WBC UA: NONE SEEN WBC/hpf (ref 0–5)

## 2014-11-25 LAB — LIPASE, BLOOD: Lipase: 37 U/L (ref 11–51)

## 2014-11-25 LAB — TROPONIN I: Troponin I: <0.03 ng/mL (ref <0.031)

## 2014-11-25 MED ORDER — PROMETHAZINE HCL 25 MG/ML IJ SOLN
12.5000 mg | Freq: Once | INTRAMUSCULAR | Status: AC
  Administered 2014-11-25: 12.5 mg via INTRAVENOUS
  Filled 2014-11-25: qty 1

## 2014-11-25 MED ORDER — IOHEXOL 300 MG/ML  SOLN
100.0000 mL | Freq: Once | INTRAMUSCULAR | Status: AC | PRN
Start: 2014-11-25 — End: 2014-11-25
  Administered 2014-11-25: 100 mL via INTRAVENOUS
  Filled 2014-11-25: qty 100

## 2014-11-25 MED ORDER — PROMETHAZINE HCL 12.5 MG PO TABS: 12.5000 mg | ORAL_TABLET | Freq: Four times a day (QID) | ORAL | Status: DC | PRN

## 2014-11-25 MED ORDER — HYDROCODONE-ACETAMINOPHEN 5-325 MG PO TABS
ORAL_TABLET | ORAL | Status: AC
  Administered 2014-11-25: 2 via ORAL
  Filled 2014-11-25: qty 2

## 2014-11-25 MED ORDER — PROMETHAZINE HCL 25 MG/ML IJ SOLN
INTRAMUSCULAR | Status: AC
  Filled 2014-11-25: qty 1

## 2014-11-25 MED ORDER — IOHEXOL 240 MG/ML SOLN
25.0000 mL | Freq: Once | INTRAMUSCULAR | Status: AC | PRN
  Administered 2014-11-25: 25 mL via ORAL
  Filled 2014-11-25: qty 25

## 2014-11-25 MED ORDER — HYDROCODONE-ACETAMINOPHEN 5-325 MG PO TABS
2.0000 | ORAL_TABLET | Freq: Once | ORAL | Status: AC
  Administered 2014-11-25: 2 via ORAL

## 2014-11-25 NOTE — ED Provider Notes (Signed)
[facility] Emergency Department Provider Note  Time seen: 7:56 PM  I have reviewed the triage vital signs and the nursing notes.   HISTORY  Chief Complaint Abdominal Cramping and Back Pain    HPI Angela [name] is a 64 y.o. female with a past medical history of endometriosis, GERD, bipolar, hypertension, hiatal hernia presents the emergency department abdominal pain. According to the patient she has been having intermittent abdominal pain for the past 5 months. Today the abdominal pain is worse starting from her epigastrium radiating down to her groin. Describes it as a moderate sharp pain. Also describes nausea. Denies diarrhea or constipation. Denies fever, black or bloody stool. Patient states a significant history of abdominal pain that has been worked up by GI medicine, obese/GYN, and her primary care physician.No modifying factors identified.     Past Medical History  Diagnosis Date  . Endometriosis   . Hypothyroidism   . GERD (gastroesophageal reflux disease)   . Bipolar 1 disorder ([place])   . Hypertension   . skin cancer   . Hernia, hiatal     Patient Active Problem List   Diagnosis Date Noted  . Pelvic pain in female 10/30/2014    Past Surgical History  Procedure Laterality Date  . Tonsillectomy    . Uteral suspension    . Appendectomy    . Hernia repair    . Laproscopy    . Cholecystectomy, laparoscopic    . Cholecystectomy      Current Outpatient Rx  Name  Route  Sig  Dispense  Refill  . amLODipine (NORVASC) 2.5 MG tablet   Oral   Take 2.5 mg by mouth daily.         . Biotin 5000 MCG CAPS   Oral   Take 1 capsule by mouth daily.          . cholecalciferol (VITAMIN D) 1000 UNITS tablet   Oral   Take 1,000 Units by mouth daily.         Marland Kitchen conjugated estrogens (PREMARIN) vaginal cream   Vaginal   Place 1 Applicatorful vaginally daily.         . divalproex (DEPAKOTE) 500 MG DR tablet   Oral   Take 500 mg by mouth  3 (three) times daily.         Marland Kitchen EPINEPHrine (EPIPEN 2-PAK) 0.3 mg/0.3 mL IJ SOAJ injection   Subcutaneous   Inject 0.3 mg into the skin.         Marland Kitchen HYDROcodone-acetaminophen (NORCO/VICODIN) 5-325 MG per tablet   Oral   Take 1 tablet by mouth every 6 (six) hours as needed for moderate pain.   5 tablet   0   . hydrocortisone (ANUSOL-HC) 2.5 % rectal cream   Rectal   Place 1 application rectally 2 (two) times daily.         Marland Kitchen lactulose (CEPHULAC) 10 G packet   Oral   Take 1 packet (10 g total) by mouth daily as needed (constipation).   10 each   0   . levothyroxine (SYNTHROID, LEVOTHROID) 88 MCG tablet   Oral   Take 88 mcg by mouth daily before breakfast.         . Lifitegrast (XIIDRA) 5 % SOLN   Ophthalmic   Apply to eye.         . magnesium sulfate (EPSOM SALTS) crystals      Use 1/2 cup X one quart warm water daily.   1816 g  3   . multivitamin-iron-minerals-folic acid (THERAPEUTIC-M) TABS tablet   Oral   Take 1 tablet by mouth daily.         . pantoprazole (PROTONIX) 40 MG tablet   Oral   Take 40 mg by mouth daily.         Marland Kitchen PARoxetine (PAXIL) 30 MG tablet   Oral   Take 30 mg by mouth daily.         . promethazine (PHENERGAN) 12.5 MG tablet   Oral   Take 1 tablet (12.5 mg total) by mouth every 6 (six) hours as needed for nausea or vomiting.   12 tablet   0   . terconazole (TERAZOL 7) 0.4 % vaginal cream   Vaginal   Place 1 applicator vaginally at bedtime.         Marland Kitchen zolpidem (AMBIEN) 10 MG tablet   Oral   Take 10 mg by mouth at bedtime as needed for sleep.           Allergies Darvon; Peanuts; Penicillins; Percocet; Sulfa antibiotics; Amikacin; Aspirin; Ibuprofen; Prednisone; Sulfur; and Ultram  Family History  Problem Relation Age of Onset  . Heart disease Father   . Cancer Mother     lung  . Urolithiasis Neg Hx   . Kidney disease Neg Hx   . Kidney cancer Neg Hx   . Prostate cancer Neg Hx     Social History Social  History  Substance Use Topics  . Smoking status: Former Research scientist (life sciences)  . Smokeless tobacco: None  . Alcohol Use: No    Review of Systems Constitutional: Negative for fever. Cardiovascular: Negative for chest pain. Respiratory: Negative for shortness of breath. Gastrointestinal: Positive for abdominal pain. Genitourinary: Negative for dysuria. Musculoskeletal: Negative for back pain. Neurological: Negative for headache 10-point ROS otherwise negative.  ____________________________________________   PHYSICAL EXAM:  VITAL SIGNS: ED Triage Vitals  Enc Vitals Group     BP 11/25/14 1347 124/75 mmHg     Pulse Rate 11/25/14 1347 68     Resp 11/25/14 1347 20     Temp 11/25/14 1353 98.6 F (37 C)     Temp Source 11/25/14 1353 Oral     SpO2 11/25/14 1347 98 %     Weight 11/25/14 1347 126 lb (57.153 kg)     Height 11/25/14 1347 5\' 3"  (1.6 m)     Head Cir --      Peak Flow --      Pain Score 11/25/14 1351 10     Pain Loc --      Pain Edu? --      Excl. in Hydro? --    Constitutional: Alert and oriented. Well appearing and in no distress. Eyes: Normal exam ENT   Head: Normocephalic and atraumatic.   Mouth/Throat: Mucous membranes are moist. Cardiovascular: Normal rate, regular rhythm. No murmur Respiratory: Normal respiratory effort without tachypnea nor retractions. Breath sounds are clear and equal bilaterally. No wheezes/rales/rhonchi. Gastrointestinal: Soft, mild diffuse tenderness palpation. No rebound or guarding. Musculoskeletal: Nontender with normal range of motion in all extremities.  Neurologic:  Normal speech and language. No gross focal neurologic deficits Skin:  Skin is warm, dry and intact.  Psychiatric: Mood and affect are normal. Speech and behavior are normal.  ____________________________________________   RADIOLOGY  CT shows no acute abnormality.  ____________________________________________   INITIAL IMPRESSION / ASSESSMENT AND PLAN / ED  COURSE  Pertinent labs & imaging results that were available during my care of the patient were  reviewed by me and considered in my medical decision making (see chart for details).  Patient presents for abdominal pain. Patient appears to have a history of chronic abdominal pain currently being worked up by GI medicine, be/GYN in her primary care physician. Due to the patient's stating moderate discomfort along with mild diffuse tenderness on exam we'll proceed with a CT scan to rule out intracranial abnormality. Labs are largely within normal limits. CT has resulted showing no acute abnormality. We'll discharge patient home with Phenergan as needed for nausea and she is to follow-up with GI medicine, her primary care doctor and GYN.  ____________________________________________   FINAL CLINICAL IMPRESSION(S) / ED DIAGNOSES  Abdominal pain Nausea   Harvest Dark, MD 11/25/14 [phone]

## 2014-11-25 NOTE — ED Notes (Signed)
Pt with hx of hiatal hernia and states she has had some pain in that area today.

## 2014-11-25 NOTE — ED Notes (Signed)
Pt requesting pain medication and water.

## 2014-11-25 NOTE — Discharge Instructions (Signed)

## 2014-12-16 ENCOUNTER — Encounter: Payer: Self-pay | Admitting: Podiatry

## 2014-12-16 ENCOUNTER — Ambulatory Visit (INDEPENDENT_AMBULATORY_CARE_PROVIDER_SITE_OTHER): Payer: Medicare Other | Admitting: Podiatry

## 2014-12-16 DIAGNOSIS — M792 Neuralgia and neuritis, unspecified: Secondary | ICD-10-CM | POA: Diagnosis not present

## 2014-12-16 NOTE — Progress Notes (Signed)
She presents today complaining of bilateral heel pain. She also states that she thinks that her neuropathy is worsening.  Objective: Vital signs are stable she is alert and oriented 3 pulses are strongly palpable bilateral. She has pain on palpation medial calcaneal tubercle bilateral.  Assessment: Plantar fasciitis bilateral/neuritis fracture.  Plan: Reinjected dehydrated alcohol bilateral heels today follow up with her in 1 month. Encouraged her to contact her PCP for evaluation of her B-12 and folate.

## 2015-01-13 ENCOUNTER — Ambulatory Visit: Payer: Medicare Other | Admitting: Podiatry

## 2015-01-20 ENCOUNTER — Ambulatory Visit (INDEPENDENT_AMBULATORY_CARE_PROVIDER_SITE_OTHER): Payer: Medicare Other | Admitting: Obstetrics and Gynecology

## 2015-01-20 ENCOUNTER — Encounter: Payer: Self-pay | Admitting: Obstetrics and Gynecology

## 2015-01-20 VITALS — BP 124/73 | HR 67 | Ht 63.0 in | Wt 126.1 lb

## 2015-01-20 DIAGNOSIS — R102 Pelvic and perineal pain: Secondary | ICD-10-CM

## 2015-01-20 DIAGNOSIS — K449 Diaphragmatic hernia without obstruction or gangrene: Secondary | ICD-10-CM | POA: Diagnosis not present

## 2015-01-20 DIAGNOSIS — N83202 Unspecified ovarian cyst, left side: Secondary | ICD-10-CM | POA: Diagnosis not present

## 2015-01-20 DIAGNOSIS — N83201 Unspecified ovarian cyst, right side: Secondary | ICD-10-CM | POA: Diagnosis not present

## 2015-01-20 DIAGNOSIS — N950 Postmenopausal bleeding: Secondary | ICD-10-CM | POA: Diagnosis not present

## 2015-01-25 NOTE — Patient Instructions (Signed)
Discontinue use of Premarin cream

## 2015-01-25 NOTE — Progress Notes (Signed)
GYNECOLOGY CLINIC PORGRESS NOTE Subjective:    Angela [name] is a 65 y.o. P35 female who is seen for further evaluation of post menopausal bleeding, and pelvic pain.  Patient has previously been seen by [facility].  Was referred to [facility] OB[facility]GYN in September 2016, and then to [facility] for further follow up and management.  An unmeasured hydrosalpinx seen on prior office ultrasound and an [place] CT scan had previously noted small bilateral ovarian cysts 2.8cm on the right and 1.4cm on the left (performed for complaints of abdominal pain). These cysts were stable from what they had been prior when she has a CT scan in 2014. Marland Kitchen Patient reports an episode of postmenopausal bleeding in February of 2016. She underwent a D&C and removal of a polyp and those findings were benign at [facility]. The patient reports that she again began bleeding about a month ago. She was then a 14 day course of provera. She took the provera and continued to bleed, but by the end of the 14 day course she no longer had any bleeding. She reports bilateral lower quadrant abdominal pain and that comes and goes, it is crampy in nature.Additionally, patient notes that she takes premarin cream that she applies to the vagina every evening and every morning for years.  Notes that [facility] recommended that she have another D&C due to episodes of bleeding Desired second opinion.     (Most of the history obtained is by review of previous records through [facility] as patient is a poor historian).   Menstrual History: OB History    Gravida Para Term Preterm AB TAB SAB Ectopic Multiple Living   1    1 1     0      Menarche age: 60 or 89 (patient cannot recall exact age) No LMP recorded. Patient is postmenopausal.  Denies h/o abnormal pap smears or STIs.    Past Medical History  Diagnosis Date  . Endometriosis   . Hypothyroidism   . GERD (gastroesophageal reflux disease)   . Bipolar 1 disorder  ([place])   . Hypertension   . skin cancer   . Hernia, hiatal     Past Surgical History  Procedure Laterality Date  . Tonsillectomy    . Uteral suspension    . Appendectomy    . Hernia repair    . Laproscopy    . Cholecystectomy, laparoscopic    . Cholecystectomy      Family History  Problem Relation Age of Onset  . Heart disease Father   . Cancer Mother     lung  . Urolithiasis Neg Hx   . Kidney disease Neg Hx   . Kidney cancer Neg Hx   . Prostate cancer Neg Hx     Social History   Social History  . Marital Status: Single    Spouse Name: N/A  . Number of Children: N/A  . Years of Education: N/A   Occupational History  . Not on file.   Social History Main Topics  . Smoking status: Former Research scientist (life sciences)  . Smokeless tobacco: Not on file  . Alcohol Use: No  . Drug Use: No  . Sexual Activity: Not Currently   Other Topics Concern  . Not on file   Social History Narrative    Current Outpatient Prescriptions on File Prior to Visit  Medication Sig Dispense Refill  . amLODipine (NORVASC) 2.5 MG tablet Take 2.5 mg by mouth daily.    Marland Kitchen  Biotin 5000 MCG CAPS Take 1 capsule by mouth daily.     . cholecalciferol (VITAMIN D) 1000 UNITS tablet Take 1,000 Units by mouth daily.    Marland Kitchen conjugated estrogens (PREMARIN) vaginal cream Place 1 Applicatorful vaginally daily.    . divalproex (DEPAKOTE) 500 MG DR tablet Take 500 mg by mouth 3 (three) times daily.    Marland Kitchen EPINEPHrine (EPIPEN 2-PAK) 0.3 mg/0.3 mL IJ SOAJ injection Inject 0.3 mg into the skin.    Marland Kitchen HYDROcodone-acetaminophen (NORCO/VICODIN) 5-325 MG per tablet Take 1 tablet by mouth every 6 (six) hours as needed for moderate pain. 5 tablet 0  . hydrocortisone (ANUSOL-HC) 2.5 % rectal cream Place 1 application rectally 2 (two) times daily.    Marland Kitchen lactulose (CEPHULAC) 10 G packet Take 1 packet (10 g total) by mouth daily as needed (constipation). 10 each 0  . levothyroxine (SYNTHROID, LEVOTHROID) 88 MCG tablet Take 88 mcg by mouth daily  before breakfast.    . Lifitegrast (XIIDRA) 5 % SOLN Apply to eye.    . magnesium sulfate (EPSOM SALTS) crystals Use 1/2 cup X one quart warm water daily. 1816 g 3  . multivitamin-iron-minerals-folic acid (THERAPEUTIC-M) TABS tablet Take 1 tablet by mouth daily.    . pantoprazole (PROTONIX) 40 MG tablet Take 40 mg by mouth daily.    Marland Kitchen PARoxetine (PAXIL) 30 MG tablet Take 30 mg by mouth daily.    . promethazine (PHENERGAN) 12.5 MG tablet Take 1 tablet (12.5 mg total) by mouth every 6 (six) hours as needed for nausea or vomiting. 15 tablet 0  . terconazole (TERAZOL 7) 0.4 % vaginal cream Place 1 applicator vaginally at bedtime.    Marland Kitchen zolpidem (AMBIEN) 10 MG tablet Take 10 mg by mouth at bedtime as needed for sleep.     No current facility-administered medications on file prior to visit.    Allergies  Allergen Reactions  . Bee Venom Hives  . Darvon [Propoxyphene] Hives and Other (See Comments)    Other Reaction: Not Assessed Hives/throat swelling Hives/throat swelling  . Peanuts [Peanut Oil] Anaphylaxis and Hives    Hives/throat swelling  . Penicillins Hives  . Percocet [Oxycodone-Acetaminophen] Swelling, Nausea And Vomiting and Anxiety    hallucinations hallucinations Hives in throat  . Sulfa Antibiotics Hives    Hives/throat swelling  . Sulfacetamide Sodium Hives    Hives/throat swelling  . Amikacin Nausea And Vomiting  . Aspirin Hives  . Bee Pollen Hives  . Doxycycline Photosensitivity  . Haloperidol Nausea And Vomiting  . Ibuprofen   . Prednisone Other (See Comments) and Nausea And Vomiting    Crazy mood swings, strange thoughts Crazy mood swings, strange thoughts  . Sulfur Hives  . Ultram [Tramadol] Hives       Review of Systems Pertinent items noted in HPI and remainder of comprehensive ROS otherwise negative.    Objective:    BP 124/73 mmHg  Pulse 67  Ht 5\' 3"  (1.6 m)  Wt 126 lb 1.6 oz (57.199 kg)  BMI 22.34 kg/m2 General appearance: alert and no  distress Neck: no adenopathy, no carotid bruit, no JVD, supple, symmetrical, trachea midline and thyroid not enlarged, symmetric, no tenderness/mass/nodules Lungs: clear to auscultation bilaterally Heart: regular rate and rhythm, S1, S2 normal, no murmur, click, rub or gallop Abdomen: soft, non-tender; bowel sounds normal; no masses,  no organomegaly Pelvic: external genitalia normal, rectovaginal septum normal and vagina normal without discharge, cervix normal appearing, no lesions, nulliparous, uterus small and mobile and nontender, adnexae without palpable  masses or tenderness.  Extremities: extremities normal, atraumatic, no cyanosis or edema Skin: Skin color, texture, turgor normal. No rashes or lesions Neurologic: Grossly normal      Lab:  Lab Results  Component Value Date   WBC 5.3 11/25/2014   HGB 12.2 11/25/2014   HCT 35.3 11/25/2014   MCV 90.4 11/25/2014   PLT 326 11/25/2014   Lab Results  Component Value Date   TSH 0.619 04/14/2014    Imaging:  CT scans reviewed 98/11/2014 and 11/25/2014)   Assessment:   Postmenopausal bleeding   Bilateral ovarian cysts (small) Pelvic pain Large hiatal hernia  Plan:    Reviewed prior records.  To have patient sign medical records from [facility] for ultrasound.   Postmenopausal bleeding - reviewed [facility] records, GYN Oncologist performed  EMB in office with scant tissue returned.  Advised patient on reccomendation for D&C based on patient's overuse of estrogen cream (using twice daily instead of twice weekly.  Also advised patient on discontinuing cream for now, which patient notes that she has not done.  Strongly encouraged patient to discontinue use of cream at this time. Wold recommend following recommendations and proceed with D&C for further evaluation.  Patient desires to think it over.  Will contact patient next week.  Recommend Tylenol or Naproxen (allergic to Ibuprofen) for pain as  needed.  OVarian cysts are small, not likely cause of pelvic pain.  Has been seen by GI for hernia.    A total of 45 minutes were spent face-to-face with the patient during the encounter with greater than 50% dealing with counseling and coordination of care.   Rubie Maid, MD [facility]

## 2015-01-29 ENCOUNTER — Encounter: Payer: Self-pay | Admitting: Obstetrics and Gynecology

## 2015-01-29 ENCOUNTER — Encounter: Payer: Self-pay | Admitting: Family

## 2015-02-03 ENCOUNTER — Encounter: Payer: Self-pay | Admitting: Obstetrics and Gynecology

## 2015-02-15 ENCOUNTER — Encounter: Payer: Self-pay | Admitting: Obstetrics and Gynecology

## 2015-02-15 ENCOUNTER — Encounter: Payer: Self-pay | Admitting: Interventional Radiology

## 2015-02-15 ENCOUNTER — Encounter: Payer: Self-pay | Admitting: Diagnostic Radiology

## 2015-02-22 ENCOUNTER — Encounter: Payer: Self-pay | Admitting: Obstetrics and Gynecology

## 2015-02-22 ENCOUNTER — Encounter: Payer: Self-pay | Admitting: Obstetrics & Gynecology

## 2015-02-24 ENCOUNTER — Encounter: Payer: Self-pay | Admitting: Podiatry

## 2015-02-24 ENCOUNTER — Ambulatory Visit (INDEPENDENT_AMBULATORY_CARE_PROVIDER_SITE_OTHER): Payer: Medicare Other | Admitting: Podiatry

## 2015-02-24 VITALS — BP 142/87 | HR 69 | Resp 18

## 2015-02-24 DIAGNOSIS — M722 Plantar fascial fibromatosis: Secondary | ICD-10-CM

## 2015-02-24 DIAGNOSIS — G5762 Lesion of plantar nerve, left lower limb: Secondary | ICD-10-CM | POA: Diagnosis not present

## 2015-02-24 DIAGNOSIS — G5761 Lesion of plantar nerve, right lower limb: Secondary | ICD-10-CM | POA: Diagnosis not present

## 2015-02-24 DIAGNOSIS — M792 Neuralgia and neuritis, unspecified: Secondary | ICD-10-CM

## 2015-02-24 NOTE — Progress Notes (Signed)
She presents today chief complaint of painful heels that she states they are approximately 60% improved at this point. Also complaining of a throbbing mycotic toenail to the hallux left.  Objective: Vital signs are stable she is alert and oriented 3. Pulses are palpable. She still has mild tenderness on palpation medially intervals bilaterally. Onychomycosis of the hallux nail left which is really not overly thickened but some degree.  Assessment: Onychomycosis hallux left with pain as well as neuritis plantar fasciitis bilateral heels.  Plan: Reinjected dehydrated alcohol to the bilateral heels today and will follow-up with her in 3 months

## 2015-03-01 ENCOUNTER — Telehealth: Payer: Self-pay | Admitting: Obstetrics and Gynecology

## 2015-03-01 ENCOUNTER — Telehealth: Payer: Self-pay | Admitting: Urology

## 2015-03-01 NOTE — Telephone Encounter (Signed)
Contacted patient as we have finally received her records from outside clinics.  After further review, a hysteroscopy D&C is recommended for her continued PMB (despite negative biopsy several months ago).  Left message with patient to return phone call to discuss plans.    Rubie Maid, MD [facility]

## 2015-03-01 NOTE — Telephone Encounter (Signed)
Dr. Request records from [facility] we faxed the request on 10-30-2014 to [phone] but never received any response from them or records.  Sharyn Lull

## 2015-03-25 DIAGNOSIS — M542 Cervicalgia: Secondary | ICD-10-CM

## 2015-03-25 DIAGNOSIS — G8929 Other chronic pain: Secondary | ICD-10-CM | POA: Insufficient documentation

## 2015-03-25 DIAGNOSIS — M50122 Cervical disc disorder at C5-C6 level with radiculopathy: Secondary | ICD-10-CM | POA: Insufficient documentation

## 2015-03-26 ENCOUNTER — Encounter: Payer: Self-pay | Admitting: *Deleted

## 2015-03-29 ENCOUNTER — Ambulatory Visit: Payer: Medicare Other | Admitting: Certified Registered Nurse Anesthetist

## 2015-03-29 ENCOUNTER — Ambulatory Visit
Admission: RE | Admit: 2015-03-29 | Discharge: 2015-03-29 | Disposition: A | Discharge status: Home / Self Care | Payer: Medicare Other | Source: Ambulatory Visit | Attending: Gastroenterology | Admitting: Gastroenterology

## 2015-03-29 ENCOUNTER — Encounter: Payer: Self-pay | Admitting: *Deleted

## 2015-03-29 ENCOUNTER — Encounter
Admission: RE | Disposition: A | Discharge status: Home / Self Care | Payer: Self-pay | Source: Ambulatory Visit | Attending: Gastroenterology

## 2015-03-29 DIAGNOSIS — Z79899 Other long term (current) drug therapy: Secondary | ICD-10-CM | POA: Diagnosis not present

## 2015-03-29 DIAGNOSIS — Z9889 Other specified postprocedural states: Secondary | ICD-10-CM | POA: Diagnosis not present

## 2015-03-29 DIAGNOSIS — Q399 Congenital malformation of esophagus, unspecified: Secondary | ICD-10-CM | POA: Diagnosis not present

## 2015-03-29 DIAGNOSIS — I10 Essential (primary) hypertension: Secondary | ICD-10-CM | POA: Insufficient documentation

## 2015-03-29 DIAGNOSIS — E039 Hypothyroidism, unspecified: Secondary | ICD-10-CM | POA: Diagnosis not present

## 2015-03-29 DIAGNOSIS — Z882 Allergy status to sulfonamides status: Secondary | ICD-10-CM | POA: Insufficient documentation

## 2015-03-29 DIAGNOSIS — K5909 Other constipation: Secondary | ICD-10-CM | POA: Diagnosis not present

## 2015-03-29 DIAGNOSIS — R1310 Dysphagia, unspecified: Secondary | ICD-10-CM | POA: Insufficient documentation

## 2015-03-29 DIAGNOSIS — Z85828 Personal history of other malignant neoplasm of skin: Secondary | ICD-10-CM | POA: Insufficient documentation

## 2015-03-29 DIAGNOSIS — Z91010 Allergy to peanuts: Secondary | ICD-10-CM | POA: Diagnosis not present

## 2015-03-29 DIAGNOSIS — F319 Bipolar disorder, unspecified: Secondary | ICD-10-CM | POA: Diagnosis not present

## 2015-03-29 DIAGNOSIS — Z885 Allergy status to narcotic agent status: Secondary | ICD-10-CM | POA: Insufficient documentation

## 2015-03-29 DIAGNOSIS — K449 Diaphragmatic hernia without obstruction or gangrene: Secondary | ICD-10-CM | POA: Insufficient documentation

## 2015-03-29 DIAGNOSIS — Z8249 Family history of ischemic heart disease and other diseases of the circulatory system: Secondary | ICD-10-CM | POA: Insufficient documentation

## 2015-03-29 DIAGNOSIS — Z87891 Personal history of nicotine dependence: Secondary | ICD-10-CM | POA: Diagnosis not present

## 2015-03-29 DIAGNOSIS — J309 Allergic rhinitis, unspecified: Secondary | ICD-10-CM | POA: Diagnosis not present

## 2015-03-29 DIAGNOSIS — K317 Polyp of stomach and duodenum: Secondary | ICD-10-CM | POA: Insufficient documentation

## 2015-03-29 DIAGNOSIS — Z801 Family history of malignant neoplasm of trachea, bronchus and lung: Secondary | ICD-10-CM | POA: Diagnosis not present

## 2015-03-29 DIAGNOSIS — R12 Heartburn: Secondary | ICD-10-CM | POA: Insufficient documentation

## 2015-03-29 DIAGNOSIS — Z87892 Personal history of anaphylaxis: Secondary | ICD-10-CM | POA: Insufficient documentation

## 2015-03-29 DIAGNOSIS — Z886 Allergy status to analgesic agent status: Secondary | ICD-10-CM | POA: Diagnosis not present

## 2015-03-29 DIAGNOSIS — K219 Gastro-esophageal reflux disease without esophagitis: Secondary | ICD-10-CM | POA: Insufficient documentation

## 2015-03-29 DIAGNOSIS — E079 Disorder of thyroid, unspecified: Secondary | ICD-10-CM | POA: Diagnosis not present

## 2015-03-29 DIAGNOSIS — Z9049 Acquired absence of other specified parts of digestive tract: Secondary | ICD-10-CM | POA: Insufficient documentation

## 2015-03-29 DIAGNOSIS — R1084 Generalized abdominal pain: Secondary | ICD-10-CM | POA: Insufficient documentation

## 2015-03-29 DIAGNOSIS — Z91030 Bee allergy status: Secondary | ICD-10-CM | POA: Insufficient documentation

## 2015-03-29 DIAGNOSIS — Z888 Allergy status to other drugs, medicaments and biological substances status: Secondary | ICD-10-CM | POA: Diagnosis not present

## 2015-03-29 DIAGNOSIS — K5790 Diverticulosis of intestine, part unspecified, without perforation or abscess without bleeding: Secondary | ICD-10-CM | POA: Diagnosis not present

## 2015-03-29 DIAGNOSIS — Z881 Allergy status to other antibiotic agents status: Secondary | ICD-10-CM | POA: Insufficient documentation

## 2015-03-29 DIAGNOSIS — G629 Polyneuropathy, unspecified: Secondary | ICD-10-CM | POA: Insufficient documentation

## 2015-03-29 HISTORY — DX: Diverticulosis of intestine, part unspecified, without perforation or abscess without bleeding: K57.90

## 2015-03-29 HISTORY — DX: Unspecified malignant neoplasm of skin, unspecified: C44.90

## 2015-03-29 HISTORY — DX: Anemia, unspecified: D64.9

## 2015-03-29 HISTORY — PX: ESOPHAGOGASTRODUODENOSCOPY (EGD) WITH PROPOFOL: SHX5813

## 2015-03-29 HISTORY — DX: Depression, unspecified: F32.A

## 2015-03-29 HISTORY — DX: Polyneuropathy, unspecified: G62.9

## 2015-03-29 HISTORY — DX: Other visual disturbances: H53.8

## 2015-03-29 HISTORY — DX: Major depressive disorder, single episode, unspecified: F32.9

## 2015-03-29 HISTORY — DX: Migraine, unspecified, not intractable, without status migrainosus: G43.909

## 2015-03-29 HISTORY — DX: Allergic rhinitis, unspecified: J30.9

## 2015-03-29 HISTORY — DX: Disorder of thyroid, unspecified: E07.9

## 2015-03-29 SURGERY — ESOPHAGOGASTRODUODENOSCOPY (EGD) WITH PROPOFOL
Anesthesia: General

## 2015-03-29 MED ORDER — GLYCOPYRROLATE 0.2 MG/ML IJ SOLN
INTRAMUSCULAR | Status: DC | PRN
  Administered 2015-03-29: 0.2 mg via INTRAVENOUS

## 2015-03-29 MED ORDER — MIDAZOLAM HCL 5 MG/5ML IJ SOLN
INTRAMUSCULAR | Status: DC | PRN
  Administered 2015-03-29: 1 mg via INTRAVENOUS

## 2015-03-29 MED ORDER — PROPOFOL 500 MG/50ML IV EMUL
INTRAVENOUS | Status: DC | PRN
  Administered 2015-03-29: 160 ug/kg/min via INTRAVENOUS

## 2015-03-29 MED ORDER — FENTANYL CITRATE (PF) 100 MCG/2ML IJ SOLN
INTRAMUSCULAR | Status: DC | PRN
  Administered 2015-03-29: 50 ug via INTRAVENOUS

## 2015-03-29 MED ORDER — LIDOCAINE HCL (CARDIAC) 20 MG/ML IV SOLN
INTRAVENOUS | Status: DC | PRN
  Administered 2015-03-29: 100 mg via INTRAVENOUS

## 2015-03-29 MED ORDER — SODIUM CHLORIDE 0.9 % IV SOLN
INTRAVENOUS | Status: DC
  Administered 2015-03-29: 08:00:00 via INTRAVENOUS

## 2015-03-29 NOTE — Transfer of Care (Signed)
Immediate Anesthesia Transfer of Care Note  Patient: Angela [name]  Procedure(s) Performed: Procedure(s): ESOPHAGOGASTRODUODENOSCOPY (EGD) WITH PROPOFOL (N/A)  Patient Location: PACU  Anesthesia Type:General  Level of Consciousness: awake, alert , oriented and patient cooperative  Airway & Oxygen Therapy: Patient Spontanous Breathing and Patient connected to nasal cannula oxygen  Post-op Assessment: Report given to RN and Post -op Vital signs reviewed and stable  Post vital signs: Reviewed and stable  Last Vitals:  Filed Vitals:   03/29/15 0708 03/29/15 0836  BP: 131/83 124/79  Pulse: 58 67  Temp: 35.8 C 36.1 C  Resp: 18 14    Complications: No apparent anesthesia complications

## 2015-03-29 NOTE — Discharge Instructions (Signed)

## 2015-03-29 NOTE — Op Note (Signed)
[facility] Patient Name: Angela [name] Procedure Date: 03/29/2015 8:10 AM MRN: [id number] Account #: [id number] Date of Birth: Jun 13, 1950 Admit Type: Outpatient Age: 65 Room: [facility] ENDO ROOM 1 Gender: Female Note Status: Finalized Procedure:            Upper GI endoscopy Indications:          Generalized abdominal pain, , Dysphagia, Heartburn,                        Suspected esophageal reflux, Follow-up of hiatal hernia Patient Profile:      This is a 65 year old female. Providers:            Gerrit Heck. Rayann Heman, MD Referring MD:         Irven Easterly. Kary Kos, MD (Referring MD) Medicines:            Propofol per Anesthesia Complications:        No immediate complications. Procedure:            Pre-Anesthesia Assessment:                       - Prior to the procedure, a History and Physical was                        performed, and patient medications, allergies and                        sensitivities were reviewed. The patient's tolerance of                        previous anesthesia was reviewed.                       After obtaining informed consent, the endoscope was                        passed under direct vision. Throughout the procedure,                        the patient's blood pressure, pulse, and oxygen                        saturations were monitored continuously. The Endoscope                        was introduced through the mouth, and advanced to the                        second part of duodenum. The upper GI endoscopy was                        accomplished without difficulty. The patient tolerated                        the procedure well. Findings:      A 6 cm hiatal hernia was present.      The examined esophagus was significantly tortuous.      Three 3 to 4 mm sessile polyps with no bleeding and no stigmata of       recent bleeding were found in the gastric body. Biopsies were taken  with       a cold forceps for histology.       - Widely patent pylorus. ? pyloroplasty      The examined duodenum was normal.      Multiple biopsies were obtained with cold forceps for histology randomly       in the lower third of the esophagus. Multiple biopsies were obtained       with cold forceps for histology randomly in the upper third of the       esophagus. Impression:           - 6 cm hiatal hernia.                       - Tortuous esophagus.                       - Three gastric polyps. Biopsied.                       - Normal examined duodenum.                       - Widely patent pylorus. ? pyloroplasty                       - Multiple biopsies were obtained in the lower third of                        the esophagus.                       - Multiple biopsies were obtained in the upper third of                        the esophagus.                       - Suspect dysphagia is due to tortuous esophagus as a                        result of large HH. Recommendation:       - Observe patient in GI recovery unit.                       - Resume regular diet.                       - Continue present medications.                       - Await pathology results.                       - Obtain barium swallow                       - Consider esophageal manometry if surgical repair of                        hernia is going to be considered.                       - Continue PPI.                       -  The findings and recommendations were discussed with                        the patient.                       - The findings and recommendations were discussed with                        the patient's family. Procedure Code(s):    --- Professional ---                       [phone], Esophagogastroduodenoscopy, flexible, transoral;                        with biopsy, single or multiple Diagnosis Code(s):    --- Professional ---                       K44.9, Diaphragmatic hernia without obstruction or                         gangrene                       Q39.9, Congenital malformation of esophagus, unspecified                       K31.7, Polyp of stomach and duodenum                       R10.84, Generalized abdominal pain                       R13.10, Dysphagia, unspecified                       R12, Heartburn CPT copyright 2016 American Medical Association. All rights reserved. The codes documented in this report are preliminary and upon coder review may  be revised to meet current compliance requirements. Mellody Life, MD 03/29/2015 8:32:48 AM This report has been signed electronically. Number of Addenda: 0 Note Initiated On: 03/29/2015 8:10 AM      [facility]

## 2015-03-29 NOTE — Anesthesia Postprocedure Evaluation (Signed)
Anesthesia Post Note  Patient: VIYANA URRA  Procedure(s) Performed: Procedure(s) (LRB): ESOPHAGOGASTRODUODENOSCOPY (EGD) WITH PROPOFOL (N/A)  Patient location during evaluation: PACU Anesthesia Type: General Level of consciousness: awake Pain management: pain level controlled Vital Signs Assessment: post-procedure vital signs reviewed and stable Respiratory status: nonlabored ventilation Cardiovascular status: stable Anesthetic complications: no    Last Vitals:  Filed Vitals:   03/29/15 0708 03/29/15 0836  BP: 131/83 124/79  Pulse: 58 67  Temp: 35.8 C 36.1 C  Resp: 18 14    Last Pain: There were no vitals filed for this visit.               VAN STAVEREN,Donyell Ding

## 2015-03-29 NOTE — H&P (Signed)
Primary Care Physician:  Maryland Pink, MD  Pre-Procedure History & Physical: HPI:  Angela [name] is a 65 y.o. female is here for an endoscopy.   Past Medical History  Diagnosis Date  . Endometriosis   . Hypothyroidism   . GERD (gastroesophageal reflux disease)   . Bipolar 1 disorder ([place])   . Hypertension   . Hernia, hiatal   . Allergic rhinitis   . Bronchitis   . Endometriosis   . Otalgia   . Otitis media   . Chronic sinusitis   . skin cancer   . Malignant neoplasm of skin   . Facial pain   . Dizziness   . Blurred vision   . Neuropathy ([place])   . Thyroid disease   . Anemia   . Depression   . Migraine   . Chronic constipation   . Diverticulitis   . Diverticulosis     Past Surgical History  Procedure Laterality Date  . Tonsillectomy    . Uteral suspension    . Appendectomy    . Hernia repair    . Laproscopy    . Cholecystectomy, laparoscopic    . Cholecystectomy      Prior to Admission medications   Medication Sig Start Date End Date Taking? Authorizing Provider  amLODipine (NORVASC) 2.5 MG tablet Take 2.5 mg by mouth daily.   Yes Historical Provider, MD  calcium carbonate (OS-CAL) 600 MG TABS tablet Take 600 mg by mouth 3 (three) times daily with meals.   Yes Historical Provider, MD  cetirizine (ZYRTEC) 10 MG tablet Take 10 mg by mouth daily.   Yes Historical Provider, MD  erythromycin ophthalmic ointment 1 application at bedtime.   Yes Historical Provider, MD  ipratropium (ATROVENT) 0.03 % nasal spray Place 2 sprays into both nostrils every 12 (twelve) hours.   Yes Historical Provider, MD  LACTOBACILLUS EXTRA STRENGTH CAPS Take by mouth.   Yes Historical Provider, MD  Linaclotide (LINZESS) 290 MCG CAPS capsule Take 290 mcg by mouth daily.   Yes Historical Provider, MD  magnesium hydroxide (MILK OF MAGNESIA) 400 MG/5ML suspension Take by mouth daily as needed for mild constipation.   Yes Historical Provider, MD  Menthol-Methyl Salicylate (ICY HOT EXTRA  STRENGTH) 10-30 % CREA Apply topically.   Yes Historical Provider, MD  ondansetron (ZOFRAN-ODT) 8 MG disintegrating tablet Take 8 mg by mouth every 8 (eight) hours as needed for nausea or vomiting.   Yes Historical Provider, MD  terbinafine (LAMISIL) 1 % cream Apply 1 application topically 2 (two) times daily.   Yes Historical Provider, MD  tiZANidine (ZANAFLEX) 4 MG tablet Take 4 mg by mouth every 6 (six) hours as needed for muscle spasms.   Yes Historical Provider, MD  triamcinolone cream (KENALOG) 0.1 % Apply 1 application topically 2 (two) times daily.   Yes Historical Provider, MD  Biotin 5000 MCG CAPS Take 1 capsule by mouth daily.     Historical Provider, MD  cholecalciferol (VITAMIN D) 1000 UNITS tablet Take 1,000 Units by mouth daily.    Historical Provider, MD  conjugated estrogens (PREMARIN) vaginal cream Place 1 Applicatorful vaginally daily.    Historical Provider, MD  divalproex (DEPAKOTE) 500 MG DR tablet Take 500 mg by mouth 3 (three) times daily.    Historical Provider, MD  EPINEPHrine (EPIPEN 2-PAK) 0.3 mg/0.3 mL IJ SOAJ injection Inject 0.3 mg into the skin.    Historical Provider, MD  HYDROcodone-acetaminophen (NORCO/VICODIN) 5-325 MG per tablet Take 1 tablet by mouth every 6 (  six) hours as needed for moderate pain. 04/30/13   Max T Hyatt, DPM  hydrocortisone (ANUSOL-HC) 2.5 % rectal cream Place 1 application rectally 2 (two) times daily.    Historical Provider, MD  lactulose (CEPHULAC) 10 G packet Take 1 packet (10 g total) by mouth daily as needed (constipation). 06/03/14   Orbie Pyo, MD  levothyroxine (SYNTHROID, LEVOTHROID) 88 MCG tablet Take 88 mcg by mouth daily before breakfast.    Historical Provider, MD  Lifitegrast Shirley Friar) 5 % SOLN Apply to eye.    Historical Provider, MD  magnesium sulfate (EPSOM SALTS) crystals Use 1/2 cup X one quart warm water daily. 05/19/13   Max T Hyatt, DPM  multivitamin-iron-minerals-folic acid (THERAPEUTIC-M) TABS tablet Take 1  tablet by mouth daily.    Historical Provider, MD  pantoprazole (PROTONIX) 40 MG tablet Take 40 mg by mouth daily.    Historical Provider, MD  PARoxetine (PAXIL) 30 MG tablet Take 30 mg by mouth daily.    Historical Provider, MD  promethazine (PHENERGAN) 12.5 MG tablet Take 1 tablet (12.5 mg total) by mouth every 6 (six) hours as needed for nausea or vomiting. 11/25/14   Harvest Dark, MD  terconazole (TERAZOL 7) 0.4 % vaginal cream Place 1 applicator vaginally at bedtime.    Historical Provider, MD  zolpidem (AMBIEN) 10 MG tablet Take 10 mg by mouth at bedtime as needed for sleep.    Historical Provider, MD    Allergies as of 03/18/2015 - Review Complete 02/24/2015  Allergen Reaction Noted  . Bee venom Hives 01/20/2015  . Darvon [propoxyphene] Hives and Other (See Comments) 01/15/2013  . Peanuts [peanut oil] Anaphylaxis and Hives 01/15/2013  . Penicillins Hives 01/15/2013  . Percocet [oxycodone-acetaminophen] Swelling, Nausea And Vomiting, and Anxiety 01/15/2013  . Sulfa antibiotics Hives 01/15/2013  . Sulfacetamide sodium Hives 01/20/2015  . Amikacin Nausea And Vomiting 01/15/2013  . Aspirin Hives 01/15/2013  . Bee pollen Hives 01/20/2015  . Doxycycline Photosensitivity 01/20/2015  . Haloperidol Nausea And Vomiting 01/20/2015  . Ibuprofen  10/30/2014  . Prednisone Other (See Comments) and Nausea And Vomiting 01/15/2013  . Sulfur Hives 05/11/2014  . Ultram [tramadol] Hives 01/15/2013    Family History  Problem Relation Age of Onset  . Heart disease Father   . Cancer Mother     lung  . Urolithiasis Neg Hx   . Kidney disease Neg Hx   . Kidney cancer Neg Hx   . Prostate cancer Neg Hx     Social History   Social History  . Marital Status: Single    Spouse Name: N/A  . Number of Children: N/A  . Years of Education: N/A   Occupational History  . Not on file.   Social History Main Topics  . Smoking status: Former Research scientist (life sciences)  . Smokeless tobacco: Never Used  . Alcohol  Use: No  . Drug Use: No  . Sexual Activity: Not Currently   Other Topics Concern  . Not on file   Social History Narrative     Physical Exam: BP 131/83 mmHg  Pulse 58  Temp(Src) 96.5 F (35.8 C) (Tympanic)  Resp 18  Ht 5\' 3"  (1.6 m)  Wt 57.153 kg (126 lb)  BMI 22.33 kg/m2  SpO2 100% General:   Alert,  pleasant and cooperative in NAD Head:  Normocephalic and atraumatic. Neck:  Supple; no masses or thyromegaly. Lungs:  Clear throughout to auscultation.    Heart:  Regular rate and rhythm. Abdomen:  Soft, nontender and nondistended. Normal  bowel sounds, without guarding, and without rebound.   Neurologic:  Alert and  oriented x4;  grossly normal neurologically.  Impression/Plan: XION MICHALAK is here for an endoscopy to be performed for dysphagia, GERD, choking, HH, hx esophageal stricture  Risks, benefits, limitations, and alternatives regarding  endoscopy have been reviewed with the patient.  Questions have been answered.  All parties agreeable.   Josefine Class, MD  03/29/2015, 8:07 AM

## 2015-03-29 NOTE — Anesthesia Preprocedure Evaluation (Signed)
Anesthesia Evaluation  Patient identified by MRN, date of birth, ID band Patient awake    Reviewed: Allergy & Precautions, NPO status , Patient's Chart, lab work & pertinent test results  Airway Mallampati: III       Dental  (+) Upper Dentures   Pulmonary former smoker,     + decreased breath sounds      Cardiovascular Exercise Tolerance: Good hypertension, Pt. on medications  Rhythm:Regular     Neuro/Psych    GI/Hepatic Neg liver ROS, hiatal hernia, GERD  ,  Endo/Other  Hypothyroidism   Renal/GU      Musculoskeletal   Abdominal Normal abdominal exam  (+)   Peds  Hematology  (+) anemia ,   Anesthesia Other Findings   Reproductive/Obstetrics                             Anesthesia Physical Anesthesia Plan  ASA: III  Anesthesia Plan: General   Post-op Pain Management:    Induction: Intravenous  Airway Management Planned: Natural Airway and Nasal Cannula  Additional Equipment:   Intra-op Plan:   Post-operative Plan:   Informed Consent: I have reviewed the patients History and Physical, chart, labs and discussed the procedure including the risks, benefits and alternatives for the proposed anesthesia with the patient or authorized representative who has indicated his/her understanding and acceptance.     Plan Discussed with: CRNA  Anesthesia Plan Comments:         Anesthesia Quick Evaluation

## 2015-03-30 ENCOUNTER — Encounter: Payer: Self-pay | Admitting: Gastroenterology

## 2015-03-30 ENCOUNTER — Other Ambulatory Visit: Payer: Self-pay | Admitting: Gastroenterology

## 2015-03-30 DIAGNOSIS — K449 Diaphragmatic hernia without obstruction or gangrene: Secondary | ICD-10-CM

## 2015-03-30 DIAGNOSIS — R1310 Dysphagia, unspecified: Secondary | ICD-10-CM

## 2015-03-30 LAB — SURGICAL PATHOLOGY

## 2015-04-01 ENCOUNTER — Encounter: Payer: Self-pay | Admitting: Emergency Medicine

## 2015-04-01 ENCOUNTER — Emergency Department
Admission: EM | Admit: 2015-04-01 | Discharge: 2015-04-01 | Disposition: A | Discharge status: Home / Self Care | Payer: Medicare Other | Attending: Emergency Medicine | Admitting: Emergency Medicine

## 2015-04-01 ENCOUNTER — Emergency Department: Payer: Medicare Other

## 2015-04-01 DIAGNOSIS — Z880 Allergy status to penicillin: Secondary | ICD-10-CM | POA: Diagnosis not present

## 2015-04-01 DIAGNOSIS — T8172XA Complication of vein following a procedure, not elsewhere classified, initial encounter: Secondary | ICD-10-CM | POA: Insufficient documentation

## 2015-04-01 DIAGNOSIS — I10 Essential (primary) hypertension: Secondary | ICD-10-CM | POA: Diagnosis not present

## 2015-04-01 DIAGNOSIS — Z7952 Long term (current) use of systemic steroids: Secondary | ICD-10-CM | POA: Insufficient documentation

## 2015-04-01 DIAGNOSIS — M7981 Nontraumatic hematoma of soft tissue: Secondary | ICD-10-CM | POA: Diagnosis present

## 2015-04-01 DIAGNOSIS — Z79899 Other long term (current) drug therapy: Secondary | ICD-10-CM | POA: Diagnosis not present

## 2015-04-01 DIAGNOSIS — I809 Phlebitis and thrombophlebitis of unspecified site: Secondary | ICD-10-CM

## 2015-04-01 DIAGNOSIS — Z87891 Personal history of nicotine dependence: Secondary | ICD-10-CM | POA: Diagnosis not present

## 2015-04-01 DIAGNOSIS — Z792 Long term (current) use of antibiotics: Secondary | ICD-10-CM | POA: Diagnosis not present

## 2015-04-01 DIAGNOSIS — I808 Phlebitis and thrombophlebitis of other sites: Secondary | ICD-10-CM | POA: Insufficient documentation

## 2015-04-01 DIAGNOSIS — T801XXA Vascular complications following infusion, transfusion and therapeutic injection, initial encounter: Secondary | ICD-10-CM

## 2015-04-01 DIAGNOSIS — Y658 Other specified misadventures during surgical and medical care: Secondary | ICD-10-CM | POA: Diagnosis not present

## 2015-04-01 MED ORDER — PROMETHAZINE HCL 25 MG PO TABS: 25.0000 mg | ORAL_TABLET | Freq: Four times a day (QID) | ORAL | Status: DC | PRN

## 2015-04-01 NOTE — ED Provider Notes (Signed)
[facility] Emergency Department Provider Note ____________________________________________  Time seen: 1454  I have reviewed the triage vital signs and the nursing notes.  HISTORY  Chief Complaint  bruise  HPI Angela [name] is a 65 y.o. female presents to the ED for evaluationof bruising noted to the right antecubital area suggested a. He describes having an endoscopy procedure on Monday and had an IV inserted into the right antecubital region. She reports that she knows that the nurse "blue" the vein. She noted the area to the arm to be extremely red in color yesterday. She notified her GI provider today who suggested that she come into the ED for evaluation for potential infection. She does admit that here today is less red and more purple in color. She's also had some intermittent nausea since the procedure, butit her provider did not call in a prescription for Phenergan for her. He is to pain to the right antecubital region at 3/10 in triage. She denies any fevers, chills, or sweats. She also denies any distal paresthesias, or skin color or temperature changes.  Past Medical History  Diagnosis Date  . Endometriosis   . Hypothyroidism   . GERD (gastroesophageal reflux disease)   . Bipolar 1 disorder ([place])   . Hypertension   . Hernia, hiatal   . Allergic rhinitis   . Bronchitis   . Endometriosis   . Otalgia   . Otitis media   . Chronic sinusitis   . skin cancer   . Malignant neoplasm of skin   . Facial pain   . Dizziness   . Blurred vision   . Neuropathy ([place])   . Thyroid disease   . Anemia   . Depression   . Migraine   . Chronic constipation   . Diverticulitis   . Diverticulosis     Patient Active Problem List   Diagnosis Date Noted  . Pelvic pain in female 10/30/2014    Past Surgical History  Procedure Laterality Date  . Tonsillectomy    . Uteral suspension    . Appendectomy    . Hernia repair    . Laproscopy    .  Cholecystectomy, laparoscopic    . Cholecystectomy    . Esophagogastroduodenoscopy (egd) with propofol N/A 03/29/2015    Procedure: ESOPHAGOGASTRODUODENOSCOPY (EGD) WITH PROPOFOL;  Surgeon: Josefine Class, MD;  Location: [facility] ENDOSCOPY;  Service: Endoscopy;  Laterality: N/A;    Current Outpatient Rx  Name  Route  Sig  Dispense  Refill  . amLODipine (NORVASC) 2.5 MG tablet   Oral   Take 2.5 mg by mouth daily.         . Biotin 5000 MCG CAPS   Oral   Take 1 capsule by mouth daily.          . calcium carbonate (OS-CAL) 600 MG TABS tablet   Oral   Take 600 mg by mouth 3 (three) times daily with meals.         . cetirizine (ZYRTEC) 10 MG tablet   Oral   Take 10 mg by mouth daily.         . cholecalciferol (VITAMIN D) 1000 UNITS tablet   Oral   Take 1,000 Units by mouth daily.         Marland Kitchen conjugated estrogens (PREMARIN) vaginal cream   Vaginal   Place 1 Applicatorful vaginally daily.         . divalproex (DEPAKOTE) 500 MG DR tablet   Oral   Take  500 mg by mouth 3 (three) times daily.         Marland Kitchen EPINEPHrine (EPIPEN 2-PAK) 0.3 mg/0.3 mL IJ SOAJ injection   Subcutaneous   Inject 0.3 mg into the skin.         Marland Kitchen erythromycin ophthalmic ointment      1 application at bedtime.         Marland Kitchen HYDROcodone-acetaminophen (NORCO/VICODIN) 5-325 MG per tablet   Oral   Take 1 tablet by mouth every 6 (six) hours as needed for moderate pain.   5 tablet   0   . hydrocortisone (ANUSOL-HC) 2.5 % rectal cream   Rectal   Place 1 application rectally 2 (two) times daily.         Marland Kitchen ipratropium (ATROVENT) 0.03 % nasal spray   Each Nare   Place 2 sprays into both nostrils every 12 (twelve) hours.         Marland Kitchen LACTOBACILLUS EXTRA STRENGTH CAPS   Oral   Take by mouth.         . lactulose (CEPHULAC) 10 G packet   Oral   Take 1 packet (10 g total) by mouth daily as needed (constipation).   10 each   0   . levothyroxine (SYNTHROID, LEVOTHROID) 88 MCG tablet   Oral    Take 88 mcg by mouth daily before breakfast.         . Lifitegrast (XIIDRA) 5 % SOLN   Ophthalmic   Apply to eye.         . Linaclotide (LINZESS) 290 MCG CAPS capsule   Oral   Take 290 mcg by mouth daily.         . magnesium hydroxide (MILK OF MAGNESIA) 400 MG/5ML suspension   Oral   Take by mouth daily as needed for mild constipation.         . magnesium sulfate (EPSOM SALTS) crystals      Use 1/2 cup X one quart warm water daily.   1816 g   3   . Menthol-Methyl Salicylate (ICY HOT EXTRA STRENGTH) 10-30 % CREA   Apply externally   Apply topically.         . multivitamin-iron-minerals-folic acid (THERAPEUTIC-M) TABS tablet   Oral   Take 1 tablet by mouth daily.         . ondansetron (ZOFRAN-ODT) 8 MG disintegrating tablet   Oral   Take 8 mg by mouth every 8 (eight) hours as needed for nausea or vomiting.         . pantoprazole (PROTONIX) 40 MG tablet   Oral   Take 40 mg by mouth daily.         Marland Kitchen PARoxetine (PAXIL) 30 MG tablet   Oral   Take 30 mg by mouth daily.         . promethazine (PHENERGAN) 12.5 MG tablet   Oral   Take 1 tablet (12.5 mg total) by mouth every 6 (six) hours as needed for nausea or vomiting.   15 tablet   0   . promethazine (PHENERGAN) 25 MG tablet   Oral   Take 1 tablet (25 mg total) by mouth every 6 (six) hours as needed for nausea or vomiting.   10 tablet   0   . terbinafine (LAMISIL) 1 % cream   Topical   Apply 1 application topically 2 (two) times daily.         Marland Kitchen terconazole (TERAZOL 7) 0.4 % vaginal cream   Vaginal  Place 1 applicator vaginally at bedtime.         Marland Kitchen tiZANidine (ZANAFLEX) 4 MG tablet   Oral   Take 4 mg by mouth every 6 (six) hours as needed for muscle spasms.         Marland Kitchen triamcinolone cream (KENALOG) 0.1 %   Topical   Apply 1 application topically 2 (two) times daily.         Marland Kitchen zolpidem (AMBIEN) 10 MG tablet   Oral   Take 10 mg by mouth at bedtime as needed for sleep.            Allergies Bee venom; Darvon; Peanuts; Penicillins; Percocet; Sulfa antibiotics; Sulfacetamide sodium; Adhesive; Amikacin; Aspirin; Bee pollen; Doxycycline; Epinephrine; Haloperidol; Hydroxyzine; Hymenoptera venom preparations; Ibuprofen; Imipramine; Morphine and related; Prednisone; Silicon; Sulfur; and Ultram  Family History  Problem Relation Age of Onset  . Heart disease Father   . Cancer Mother     lung  . Urolithiasis Neg Hx   . Kidney disease Neg Hx   . Kidney cancer Neg Hx   . Prostate cancer Neg Hx     Social History Social History  Substance Use Topics  . Smoking status: Former Research scientist (life sciences)  . Smokeless tobacco: Never Used  . Alcohol Use: No    Review of Systems  Constitutional: Negative for fever. Eyes: Negative for visual changes. ENT: Negative for sore throat. Cardiovascular: Negative for chest pain. Respiratory: Negative for shortness of breath. Gastrointestinal: Negative for abdominal pain, vomiting and diarrhea. Genitourinary: Negative for dysuria. Musculoskeletal: Negative for back pain. Skin: Negative for rash. Neurological: Negative for headaches, focal weakness or numbness. ____________________________________________  PHYSICAL EXAM:  VITAL SIGNS: ED Triage Vitals  Enc Vitals Group     BP 04/01/15 1436 116/91 mmHg     Pulse Rate 04/01/15 1436 77     Resp 04/01/15 1436 16     Temp 04/01/15 1436 98.1 F (36.7 C)     Temp Source 04/01/15 1436 Oral     SpO2 04/01/15 1436 98 %     Weight 04/01/15 1436 125 lb (56.7 kg)     Height 04/01/15 1436 5\' 3"  (1.6 m)     Head Cir --      Peak Flow --      Pain Score 04/01/15 1437 3     Pain Loc --      Pain Edu? --      Excl. in [place]? --    Constitutional: Alert and oriented. Well appearing and in no distress. Head: Normocephalic and atraumatic. Eyes: Conjunctivae are normal. PERRL. Normal extraocular movements Mouth/Throat: Mucous membranes are moist.   Neck: Supple. No  thyromegaly. Hematological/Lymphatic/Immunological: No cervical lymphadenopathy. Cardiovascular: Normal rate, regular rhythm. Normal distal pulses, capillary refill distally. Respiratory: Normal respiratory effort. No wheezes/rales/rhonchi. Musculoskeletal: Nontender with normal range of motion in all extremities.  Neurologic:  Normal gait without ataxia. Normal speech and language. No gross focal neurologic deficits are appreciated. Skin:  Skin is warm, dry and intact. No rash noted. Patient noted to have dark ecchymosis to the medial aspect of the right antecubital region consistent with a traumatic venous access. No induration, inflammation, warmth, or drainage appreciated. No palpable phlebitis is noted. Psychiatric: Mood and affect are normal. Patient exhibits appropriate insight and judgment. ____________________________________________   RADIOLOGY  RUE Venous Doppler  IMPRESSION: No evidence of deep venous thrombosis. ____________________________________________  INITIAL IMPRESSION / ASSESSMENT AND PLAN / ED COURSE  Patient with the local superficial ecchymosis and bruise secondary to  traumatic IV access to the right antecubital region. No indication on exam or ultrasonography consistent with a superficial or deep vein thrombosis. Patient was unable to remain in the ED for resulting of ultrasound results. She was discharged with instructions on management of a local phlebitis and ecchymosis. She'll be notified via telephone of her normal venous Doppler ultrasound. She'll be encouraged to apply warm compresses to the area for resolution. She'll follow with primary care provider or GI specialist for further management. The patient for Phenergan is provided at the patient's request. ____________________________________________  FINAL CLINICAL IMPRESSION(S) / ED DIAGNOSES  Final diagnoses:  Phlebitis after infusion, initial encounter  Phlebitis      Melvenia Needles,  PA-C 04/01/15 1946  Harvest Dark, MD 04/01/15 [phone]

## 2015-04-01 NOTE — Discharge Instructions (Signed)
Phlebitis Phlebitis is soreness and swelling (inflammation) of a vein. This can occur in your arms, legs, or torso (trunk), as well as deeper inside your body. Phlebitis is usually not serious when it occurs close to the surface of the body. However, it can cause serious problems when it occurs in a vein deeper inside the body. CAUSES  Phlebitis can be triggered by various things, including:   Reduced blood flow through your veins. This can happen with:  Bed rest over a long period.  Long-distance travel.  Injury.  Surgery.  Being overweight (obese) or pregnant.  Having an IV tube put in the vein and getting certain medicines through the vein.  Cancer and cancer treatment.  Use of illegal drugs taken through the vein.  Inflammatory diseases.  Inherited (genetic) diseases that increase the risk of blood clots.  Hormone therapy, such as birth control pills. SIGNS AND SYMPTOMS   Red, tender, swollen, and painful area on your skin. Usually, the area will be long and narrow.  Firmness along the center of the affected area. This can indicate that a blood clot has formed.  Low-grade fever. DIAGNOSIS  A health care provider can usually diagnose phlebitis by examining the affected area and asking about your symptoms. To check for infection or blood clots, your health care provider may order blood tests or an ultrasound exam of the area. Blood tests and your family history may also indicate if you have an underlying genetic disease that causes blood clots. Occasionally, a piece of tissue is taken from the body (biopsy sample) if an unusual cause of phlebitis is suspected. TREATMENT  Treatment will vary depending on the severity of the condition and the area of the body affected. Treatment may include:  Use of a warm compress or heating pad.  Use of compression stockings or bandages.  Anti-inflammatory medicines.  Removal of any IV tube that may be causing the problem.  Medicines  that kill germs (antibiotics) if an infection is present.  Blood-thinning medicines if a blood clot is suspected or present.  In rare cases, surgery may be needed to remove damaged sections of vein. HOME CARE INSTRUCTIONS   Only take over-the-counter or prescription medicines as directed by your health care provider. Take all medicines exactly as prescribed.  Raise (elevate) the affected area above the level of your heart as directed by your health care provider.  Apply a warm compress or heating pad to the affected area as directed by your health care provider. Do not sleep with the heating pad.  Use compression stockings or bandages as directed. These will speed healing and prevent the condition from coming back.  If you are on blood thinners:  Get follow-up blood tests as directed by your health care provider.  Check with your health care provider before using any new medicines.  Carry a medical alert card or wear your medical alert jewelry to show that you are on blood thinners.  For phlebitis in the legs:  Avoid prolonged standing or bed rest.  Keep your legs moving. Raise your legs when sitting or lying.  Do not smoke.  Women, particularly those over the age of 86, should consider the risks and benefits of taking the contraceptive pill. This kind of hormone treatment can increase your risk for blood clots.  Follow up with your health care provider as directed. SEEK MEDICAL CARE IF:   You have unusual bruising or any bleeding problems.  Your swelling or pain in the affected area  is not improving.  You are on anti-inflammatory medicine, and you develop belly (abdominal) pain. SEEK IMMEDIATE MEDICAL CARE IF:   You have a sudden onset of chest pain or difficulty breathing.  You have a fever or persistent symptoms for more than 2-3 days.  You have a fever and your symptoms suddenly get worse. MAKE SURE YOU:  Understand these instructions.  Will watch your  condition.  Will get help right away if you are not doing well or get worse.   This information is not intended to replace advice given to you by your health care provider. Make sure you discuss any questions you have with your health care provider.   Document Released: 12/27/2000 Document Revised: 10/23/2012 Document Reviewed: 09/09/2012 Elsevier Interactive Patient Education 2016 [place] will be called to discuss the results of your ultrasound study. Apply warm compresses to the to reduce pain. Follow-up with your provider as needed. Take OTC allergy medicine (Allegra, Claritin, or Zyrtec) for runny nose and sneezing. Dose Tylenol for headache pain relief.

## 2015-04-01 NOTE — ED Notes (Addendum)
Pt reports she had an endoscopy on Monday and nurse inserted IV into right AC area. Pt reports area has been swollen with purple and green bruising noted. Pt denies use of blood thinners. Pt states "I'm just worried about this red line", no red line visible to this RN. Pt also reports nausea, asking for phenergan by name. Pt reports eating lunch prior to ED visit and brought 2 bags of food with her.

## 2015-04-01 NOTE — ED Notes (Signed)
See triage note  Large bruised area noted to right AC area  States pain is worse today  Good pulses and sensation

## 2015-04-06 ENCOUNTER — Ambulatory Visit: Payer: Medicare Other

## 2015-04-13 ENCOUNTER — Ambulatory Visit (INDEPENDENT_AMBULATORY_CARE_PROVIDER_SITE_OTHER): Payer: Medicare Other | Admitting: Obstetrics and Gynecology

## 2015-04-13 ENCOUNTER — Encounter: Payer: Self-pay | Admitting: Obstetrics and Gynecology

## 2015-04-13 VITALS — BP 135/78 | HR 76 | Ht 63.0 in | Wt 119.0 lb

## 2015-04-13 DIAGNOSIS — Z8742 Personal history of other diseases of the female genital tract: Secondary | ICD-10-CM | POA: Diagnosis not present

## 2015-04-13 DIAGNOSIS — F4321 Adjustment disorder with depressed mood: Secondary | ICD-10-CM

## 2015-04-13 DIAGNOSIS — F32A Depression, unspecified: Secondary | ICD-10-CM

## 2015-04-13 DIAGNOSIS — N9089 Other specified noninflammatory disorders of vulva and perineum: Secondary | ICD-10-CM | POA: Diagnosis not present

## 2015-04-13 DIAGNOSIS — N760 Acute vaginitis: Secondary | ICD-10-CM | POA: Diagnosis not present

## 2015-04-13 DIAGNOSIS — F329 Major depressive disorder, single episode, unspecified: Secondary | ICD-10-CM

## 2015-04-13 MED ORDER — ESTRADIOL 2 MG VA RING: 2.0000 mg | VAGINAL_RING | VAGINAL | Status: DC

## 2015-04-13 MED ORDER — FLUCONAZOLE 150 MG PO TABS: 150.0000 mg | ORAL_TABLET | Freq: Once | ORAL | Status: DC

## 2015-04-14 ENCOUNTER — Telehealth: Payer: Self-pay | Admitting: Obstetrics and Gynecology

## 2015-04-14 NOTE — Telephone Encounter (Signed)
I spoke with Angela [name] regarding this, and advised that patient can take an OTC pain reliever for cramping, does not need to take her Hydrocodone for her pelvic cramping.  She may have a small amount of bleeding from her biopsy site and speculum exam yesterday, but should not be anything heavy.  Advised to place a small bandaid or compression dessing to site of bleeding if external.  If internal, should monitor bleeding, where a pantyliner, and if bleeding increases or does not resolve within 1-2 days, should return for follow-up.

## 2015-04-14 NOTE — Telephone Encounter (Signed)
Patient called back and was asking when she can expect a call back from previous message. I informed patient there is a 24 hour turn around time for phone calls. Thanks

## 2015-04-14 NOTE — Telephone Encounter (Signed)
Pt called and had biopisi yesterday and she is bleeding and in a lot of pain, having pelvic cramping and wanted a call back on if this is normal and if she can take anything.

## 2015-04-14 NOTE — Telephone Encounter (Signed)
Please advise on what pt can take as she has lots of allergies, wanted to check with you first.

## 2015-04-15 NOTE — Telephone Encounter (Signed)
Called pt no answer. LM for pt informing her of the information below, and the need to come back in if no improvement.

## 2015-04-16 ENCOUNTER — Emergency Department
Admission: EM | Admit: 2015-04-16 | Discharge: 2015-04-17 | Disposition: A | Discharge status: Home / Self Care | Payer: Medicare Other | Attending: Emergency Medicine | Admitting: Emergency Medicine

## 2015-04-16 ENCOUNTER — Encounter: Payer: Self-pay | Admitting: Emergency Medicine

## 2015-04-16 DIAGNOSIS — Z7952 Long term (current) use of systemic steroids: Secondary | ICD-10-CM | POA: Diagnosis not present

## 2015-04-16 DIAGNOSIS — T814XXA Infection following a procedure, initial encounter: Secondary | ICD-10-CM | POA: Diagnosis present

## 2015-04-16 DIAGNOSIS — R1030 Lower abdominal pain, unspecified: Secondary | ICD-10-CM | POA: Insufficient documentation

## 2015-04-16 DIAGNOSIS — I10 Essential (primary) hypertension: Secondary | ICD-10-CM | POA: Diagnosis not present

## 2015-04-16 DIAGNOSIS — Z79899 Other long term (current) drug therapy: Secondary | ICD-10-CM | POA: Insufficient documentation

## 2015-04-16 DIAGNOSIS — Y658 Other specified misadventures during surgical and medical care: Secondary | ICD-10-CM | POA: Diagnosis not present

## 2015-04-16 DIAGNOSIS — N762 Acute vulvitis: Secondary | ICD-10-CM | POA: Diagnosis not present

## 2015-04-16 DIAGNOSIS — L03818 Cellulitis of other sites: Secondary | ICD-10-CM | POA: Diagnosis not present

## 2015-04-16 DIAGNOSIS — Z880 Allergy status to penicillin: Secondary | ICD-10-CM | POA: Insufficient documentation

## 2015-04-16 DIAGNOSIS — Z87891 Personal history of nicotine dependence: Secondary | ICD-10-CM | POA: Diagnosis not present

## 2015-04-16 DIAGNOSIS — Z79890 Hormone replacement therapy (postmenopausal): Secondary | ICD-10-CM | POA: Insufficient documentation

## 2015-04-16 LAB — WET PREP, GENITAL
Clue Cells Wet Prep HPF POC: NONE SEEN
SPERM: NONE SEEN
Trich, Wet Prep: NONE SEEN
Yeast Wet Prep HPF POC: NONE SEEN

## 2015-04-16 LAB — URINALYSIS COMPLETE WITH MICROSCOPIC (ARMC ONLY)
BILIRUBIN URINE: NEGATIVE
Bacteria, UA: NONE SEEN
Glucose, UA: NEGATIVE mg/dL
Hgb urine dipstick: NEGATIVE
KETONES UR: NEGATIVE mg/dL
Nitrite: NEGATIVE
Protein, ur: NEGATIVE mg/dL
SPECIFIC GRAVITY, URINE: 1.006 (ref 1.005–1.030)
SQUAMOUS EPITHELIAL / LPF: NONE SEEN
pH: 7 (ref 5.0–8.0)

## 2015-04-16 LAB — CBC
HCT: 40 % (ref 35.0–47.0)
Hemoglobin: 13.4 g/dL (ref 12.0–16.0)
MCH: 30.6 pg (ref 26.0–34.0)
MCHC: 33.5 g/dL (ref 32.0–36.0)
MCV: 91.6 fL (ref 80.0–100.0)
PLATELETS: 356 10*3/uL (ref 150–440)
RBC: 4.36 MIL/uL (ref 3.80–5.20)
RDW: 13.4 % (ref 11.5–14.5)
WBC: 9.8 10*3/uL (ref 3.6–11.0)

## 2015-04-16 LAB — COMPREHENSIVE METABOLIC PANEL
ALK PHOS: 82 U/L (ref 38–126)
ALT: 15 U/L (ref 14–54)
AST: 22 U/L (ref 15–41)
Albumin: 4.1 g/dL (ref 3.5–5.0)
Anion gap: 7 (ref 5–15)
BUN: 15 mg/dL (ref 6–20)
CALCIUM: 8.9 mg/dL (ref 8.9–10.3)
CHLORIDE: 100 mmol/L — AB (ref 101–111)
CO2: 24 mmol/L (ref 22–32)
CREATININE: 0.56 mg/dL (ref 0.44–1.00)
Glucose, Bld: 88 mg/dL (ref 65–99)
Potassium: 3.7 mmol/L (ref 3.5–5.1)
Sodium: 131 mmol/L — ABNORMAL LOW (ref 135–145)
Total Bilirubin: 0.3 mg/dL (ref 0.3–1.2)
Total Protein: 7.3 g/dL (ref 6.5–8.1)

## 2015-04-16 LAB — LIPASE, BLOOD: LIPASE: 34 U/L (ref 11–51)

## 2015-04-16 MED ORDER — ONDANSETRON HCL 4 MG/2ML IJ SOLN: 4.0000 mg | Freq: Once | INTRAMUSCULAR | Status: DC

## 2015-04-16 MED ORDER — ONDANSETRON HCL 4 MG/2ML IJ SOLN
4.0000 mg | Freq: Once | INTRAMUSCULAR | Status: AC
  Administered 2015-04-16: 4 mg via INTRAVENOUS
  Filled 2015-04-16 (×2): qty 2

## 2015-04-16 MED ORDER — SODIUM CHLORIDE 0.9 % IV BOLUS (SEPSIS)
1000.0000 mL | Freq: Once | INTRAVENOUS | Status: AC
  Administered 2015-04-16: 1000 mL via INTRAVENOUS

## 2015-04-16 MED ORDER — DIATRIZOATE MEGLUMINE & SODIUM 66-10 % PO SOLN
15.0000 mL | Freq: Once | ORAL | Status: AC
  Administered 2015-04-16: 15 mL via ORAL

## 2015-04-16 NOTE — ED Provider Notes (Signed)
[facility] Emergency Department Provider Note  ____________________________________________  Time seen: Approximately [id number] PM  I have reviewed the triage vital signs and the nursing notes.   HISTORY  Chief Complaint Post-op Problem    HPI Angela [name] is a 65 y.o. female with a history of bipolar disorder and a recent skin biopsy to her right labia is presenting to the emergency department with redness to the right labia as well as bleeding postbiopsy. She is also complaining of lower abdominal cramping as well as loss of appetite since the biopsy 3 days ago. She said that she was recently seen, this past Tuesday, at the OB/GYN with Dr. Marcelline Mates. She says that she was given a dose of Diflucan for presumed yeast infection because of vaginal discharge. She denies any sexual activity. She denies any vaginal bleeding.She said that upon calling the office of Dr. Marcelline Mates about her symptoms after the biopsy she was referred to the emergency department for further workup.   Past Medical History  Diagnosis Date  . Endometriosis   . Hypothyroidism   . GERD (gastroesophageal reflux disease)   . Bipolar 1 disorder ([place])   . Hypertension   . Hernia, hiatal   . Allergic rhinitis   . Bronchitis   . Endometriosis   . Otalgia   . Otitis media   . Chronic sinusitis   . skin cancer   . Malignant neoplasm of skin   . Facial pain   . Dizziness   . Blurred vision   . Neuropathy ([place])   . Thyroid disease   . Anemia   . Depression   . Migraine   . Chronic constipation   . Diverticulitis   . Diverticulosis     Patient Active Problem List   Diagnosis Date Noted  . Pelvic pain in female 10/30/2014    Past Surgical History  Procedure Laterality Date  . Tonsillectomy    . Uteral suspension    . Appendectomy    . Hernia repair    . Laproscopy    . Cholecystectomy, laparoscopic    . Cholecystectomy    . Esophagogastroduodenoscopy (egd) with propofol N/A  03/29/2015    Procedure: ESOPHAGOGASTRODUODENOSCOPY (EGD) WITH PROPOFOL;  Surgeon: Josefine Class, MD;  Location: [facility] ENDOSCOPY;  Service: Endoscopy;  Laterality: N/A;    Current Outpatient Rx  Name  Route  Sig  Dispense  Refill  . acetaminophen (RA ACETAMINOPHEN) 650 MG CR tablet   Oral   Take by mouth.         Marland Kitchen amLODipine (NORVASC) 2.5 MG tablet   Oral   Take 2.5 mg by mouth daily.         . Biotin 5000 MCG CAPS   Oral   Take 1 capsule by mouth daily.          . calcium carbonate (OS-CAL) 600 MG TABS tablet   Oral   Take 600 mg by mouth 3 (three) times daily with meals.         . calcium carbonate (OSCAL) 1500 (600 Ca) MG TABS tablet   Oral   Take by mouth.         . cetirizine (ZYRTEC) 10 MG tablet   Oral   Take 10 mg by mouth daily.         . Cholecalciferol (VITAMIN D-1000 MAX ST) 1000 units tablet   Oral   Take by mouth.         . conjugated estrogens (PREMARIN) vaginal  cream   Vaginal   Place 1 Applicatorful vaginally daily.         . cycloSPORINE (RESTASIS) 0.05 % ophthalmic emulsion   Ophthalmic   Apply to eye.         . divalproex (DEPAKOTE) 500 MG DR tablet   Oral   Take 500 mg by mouth 3 (three) times daily.         Marland Kitchen EPINEPHrine (EPIPEN 2-PAK) 0.3 mg/0.3 mL IJ SOAJ injection   Subcutaneous   Inject 0.3 mg into the skin.         Marland Kitchen erythromycin ophthalmic ointment      1 application at bedtime.         Marland Kitchen estradiol (ESTRING) 2 MG vaginal ring   Vaginal   Place 2 mg vaginally every 3 (three) months. follow package directions   1 each   12   . fluconazole (DIFLUCAN) 150 MG tablet   Oral   Take 1 tablet (150 mg total) by mouth once. Can take additional dose three days later if symptoms persist   1 tablet   3   . HYDROcodone-acetaminophen (NORCO/VICODIN) 5-325 MG per tablet   Oral   Take 1 tablet by mouth every 6 (six) hours as needed for moderate pain.   5 tablet   0   . hydrocortisone (ANUSOL-HC) 2.5 %  rectal cream   Rectal   Place 1 application rectally 2 (two) times daily.         . hydrocortisone (ANUSOL-HC) 2.5 % rectal cream   Topical   Apply topically.         . Hypromellose (GENTEAL MILD) 0.2 % SOLN   Ophthalmic   Apply to eye.         Marland Kitchen ipratropium (ATROVENT) 0.03 % nasal spray   Each Nare   Place 2 sprays into both nostrils every 12 (twelve) hours.         Marland Kitchen LACTOBACILLUS EXTRA STRENGTH CAPS   Oral   Take by mouth.         Marland Kitchen LACTOBACILLUS PO   Oral   Take by mouth.         . lactulose (CEPHULAC) 10 G packet   Oral   Take 1 packet (10 g total) by mouth daily as needed (constipation).   10 each   0   . levothyroxine (SYNTHROID, LEVOTHROID) 88 MCG tablet   Oral   Take 88 mcg by mouth daily before breakfast.         . levothyroxine (SYNTHROID, LEVOTHROID) 88 MCG tablet   Oral   Take by mouth.         . lidocaine (XYLOCAINE) 2 % solution               . Lifitegrast (XIIDRA) 5 % SOLN   Ophthalmic   Apply to eye.         . Linaclotide (LINZESS) 290 MCG CAPS capsule   Oral   Take 290 mcg by mouth daily.         . magnesium hydroxide (MILK OF MAGNESIA) 400 MG/5ML suspension   Oral   Take by mouth daily as needed for mild constipation.         . magnesium sulfate (EPSOM SALTS) crystals      Use 1/2 cup X one quart warm water daily.   1816 g   3   . Menthol-Methyl Salicylate (ICY HOT EXTRA STRENGTH) 10-30 % CREA   Apply externally   Apply  topically.         . multivitamin-iron-minerals-folic acid (THERAPEUTIC-M) TABS tablet   Oral   Take 1 tablet by mouth daily.         . Omega-3 Krill Oil 1000 MG CAPS   Oral   Take by mouth.         . ondansetron (ZOFRAN-ODT) 8 MG disintegrating tablet   Oral   Take 8 mg by mouth every 8 (eight) hours as needed for nausea or vomiting.         . pantoprazole (PROTONIX) 40 MG tablet   Oral   Take 40 mg by mouth daily.         Marland Kitchen PARoxetine (PAXIL) 30 MG tablet   Oral    Take 30 mg by mouth daily.         . promethazine (PHENERGAN) 12.5 MG tablet   Oral   Take 1 tablet (12.5 mg total) by mouth every 6 (six) hours as needed for nausea or vomiting.   15 tablet   0   . promethazine (PHENERGAN) 25 MG tablet   Oral   Take 1 tablet (25 mg total) by mouth every 6 (six) hours as needed for nausea or vomiting.   10 tablet   0   . Spirulina 500 MG TABS   Oral   Take by mouth.         . terbinafine (LAMISIL) 1 % cream   Topical   Apply 1 application topically 2 (two) times daily.         Marland Kitchen terconazole (TERAZOL 7) 0.4 % vaginal cream   Vaginal   Place 1 applicator vaginally at bedtime.         Marland Kitchen tiZANidine (ZANAFLEX) 4 MG tablet   Oral   Take 4 mg by mouth every 6 (six) hours as needed for muscle spasms.         Marland Kitchen triamcinolone cream (KENALOG) 0.1 %   Topical   Apply 1 application topically 2 (two) times daily.         Marland Kitchen zolpidem (AMBIEN) 10 MG tablet   Oral   Take 10 mg by mouth at bedtime as needed for sleep.           Allergies Bee venom; Darvon; Peanuts; Penicillins; Percocet; Sulfa antibiotics; Sulfacetamide sodium; Adhesive; Amikacin; Aspirin; Bee pollen; Doxycycline; Epinephrine; Haloperidol; Hydroxyzine; Hymenoptera venom preparations; Ibuprofen; Imipramine; Morphine and related; Prednisone; Silicon; Sulfur; and Ultram  Family History  Problem Relation Age of Onset  . Heart disease Father   . Cancer Mother     lung  . Urolithiasis Neg Hx   . Kidney disease Neg Hx   . Kidney cancer Neg Hx   . Prostate cancer Neg Hx     Social History Social History  Substance Use Topics  . Smoking status: Former Research scientist (life sciences)  . Smokeless tobacco: Never Used  . Alcohol Use: No    Review of Systems Constitutional: No fever/chills Eyes: No visual changes. ENT: No sore throat. Cardiovascular: Denies chest pain. Respiratory: Denies shortness of breath. Gastrointestinal: no vomiting.  No diarrhea.  No constipation. Genitourinary:  Negative for dysuria. Musculoskeletal:Abdominal pain radiating to the low back. Skin: Negative for rash. Neurological: Negative for headaches, focal weakness or numbness.  10-point ROS otherwise negative.  ____________________________________________   PHYSICAL EXAM:  VITAL SIGNS: ED Triage Vitals  Enc Vitals Group     BP 04/16/15 1916 127/85 mmHg     Pulse Rate 04/16/15 1916 81  Resp 04/16/15 1916 16     Temp 04/16/15 1916 98.8 F (37.1 C)     Temp Source 04/16/15 1916 Oral     SpO2 04/16/15 1916 99 %     Weight 04/16/15 1916 130 lb (58.968 kg)     Height 04/16/15 1916 5\' 2"  (1.575 m)     Head Cir --      Peak Flow --      Pain Score 04/16/15 1917 10     Pain Loc --      Pain Edu? --      Excl. in [name]? --     Constitutional: Alert and oriented. Well appearing and in no acute distress. Eyes: Conjunctivae are normal. PERRL. EOMI. Head: Atraumatic. Nose: No congestion/rhinnorhea. Mouth/Throat: Mucous membranes are moist.   Neck: No stridor.   Cardiovascular: Normal rate, regular rhythm. Grossly normal heart sounds.  Good peripheral circulation. Respiratory: Normal respiratory effort.  No retractions. Lungs CTAB. Gastrointestinal: Soft And mild to moderate tenderness across lower abdomen without any masses. No rebound or guarding. No distention. No CVA tenderness. Genitourinary:  Right labia majora with 2 lesions that appear to be punch biopsies to the inferior aspect. Small amount of stranding erythema without any pus or induration. Tenderness palpation over the lesions. No extensive erythema on the buttock as reported by the patient. Left labia majora with a very small amount of erythema inferiorly which is about 1 x 2 cm. The right side is about 2 x 3 cm of erythema. Again on the left there is no induration or pus. There is no extensive erythema on the left buttock. Musculoskeletal: No lower extremity tenderness nor edema.  No joint effusions. Neurologic:  Normal speech  and language. No gross focal neurologic deficits are appreciated. No gait instability. Skin:  Skin is warm, dry and intact. No rash noted. Psychiatric: Normal mood  ____________________________________________   LABS (all labs ordered are listed, but only abnormal results are displayed)  Labs Reviewed  WET PREP, GENITAL - Abnormal; Notable for the following:    WBC, Wet Prep HPF POC FEW (*)    All other components within normal limits  URINALYSIS COMPLETEWITH MICROSCOPIC ([facility] ONLY) - Abnormal; Notable for the following:    Color, Urine STRAW (*)    APPearance CLEAR (*)    Leukocytes, UA 1+ (*)    All other components within normal limits  CHLAMYDIA/NGC RT PCR ([facility] ONLY)  CBC  LIPASE, BLOOD  COMPREHENSIVE METABOLIC PANEL   ____________________________________________  EKG   ____________________________________________  RADIOLOGY  Pending CAT scan of the abdomen and pelvis. ____________________________________________   PROCEDURES   ____________________________________________   INITIAL IMPRESSION / ASSESSMENT AND PLAN / ED COURSE  Pertinent labs & imaging results that were available during my care of the patient were reviewed by me and considered in my medical decision making (see chart for details).  Patient with a history of bipolar disorder and multiple complaints. However, I am concerned about lower abdominal pain because of the tenderness on palpation. We will CAT scan abdomen. If no acute pathology on the abdominal CAT scan and likely discharge home with Keflex for very mild cellulitis after the biopsy. Signed out to Dr. Jacqualine Code. ____________________________________________   FINAL CLINICAL IMPRESSION(S) / ED DIAGNOSES  Abdominal pain. Labial cellulitis.    Orbie Pyo, MD 04/16/15 [phone]

## 2015-04-16 NOTE — ED Notes (Signed)
Pt ambulatory to room with NAD noted.

## 2015-04-16 NOTE — ED Notes (Signed)
Pt. States she had biopsy of labia this past Tuesday.  Pt.  States she had bleeding the next day and called her pcp.  They told her it was to be expected and call if condition changes.  Pt.  States intermittent bleeding and increased swelling to labia.

## 2015-04-16 NOTE — ED Notes (Signed)
Patient presents to the ED with vaginal pain since Tuesday when patient's OB-GYN took a biopsy of patient's labia.  Patient is very anxious during triage and reports redness and swelling to vaginal area.  Patient called her OB-GYN office and was instructed to come to the ED. Patient's skin is warm and dry.

## 2015-04-16 NOTE — ED Notes (Signed)
Patient in [place], refused phlebotomy stick on the condition that she preferred an IV.

## 2015-04-17 ENCOUNTER — Emergency Department: Payer: Medicare Other

## 2015-04-17 LAB — CHLAMYDIA/NGC RT PCR (ARMC ONLY)
CHLAMYDIA TR: NOT DETECTED
N GONORRHOEAE: NOT DETECTED

## 2015-04-17 MED ORDER — IOPAMIDOL (ISOVUE-300) INJECTION 61%
50.0000 mL | Freq: Once | INTRAVENOUS | Status: AC | PRN
  Administered 2015-04-17: 40 mL via INTRAVENOUS

## 2015-04-17 MED ORDER — IOPAMIDOL (ISOVUE-300) INJECTION 61%
100.0000 mL | Freq: Once | INTRAVENOUS | Status: AC | PRN
  Administered 2015-04-17: 100 mL via INTRAVENOUS

## 2015-04-17 MED ORDER — CEPHALEXIN 500 MG PO CAPS: 500.0000 mg | ORAL_CAPSULE | Freq: Four times a day (QID) | ORAL | Status: DC

## 2015-04-17 NOTE — ED Notes (Signed)
Pt. States she used bathroom to urinate and pt. States slight burning sensation in groin.

## 2015-04-17 NOTE — ED Provider Notes (Addendum)
Filed Vitals:   04/16/15 1916  BP: 127/85  Pulse: 81  Temp: 98.8 F (37.1 C)  Resp: 16     IMPRESSION: 1. Minimal soft tissue inflammation at the labia. No significant deep soft tissue inflammation seen. No evidence of abscess. No focal fluid collection seen. 2. Moderate to large hiatal hernia noted. 3. Scattered diverticulosis along the distal sigmoid colon, without evidence of diverticulitis. 4. 2.8 cm right adnexal cystic focus is likely physiologic.   Electronically Signed By: Garald Balding M.D. On: 04/17/2015 01:34   CT demonstrates no acute abscess or significant etiology explaining patient's lower abdominal pain. Based on discussion with Dr. Orlean Bradford we will treat her with cephalexin and she'll follow-up with Dr. Marcelline Mates as an outpatient.  Discharge instructions and return precautions given.Reviewed patient's previous allergies, she states she can take cephalexin without issue. Resting comfortably at this time.  Delman Kitten, MD 04/17/15 [id number]  Delman Kitten, MD 04/17/15 [phone]

## 2015-04-17 NOTE — Discharge Instructions (Signed)
Follow-up with in [facility] on Monday. Return to emergency room if you develop a fever, worsening pain or swelling, or other new concerns arise.  Cellulitis Cellulitis is an infection of the skin and the tissue beneath it. The infected area is usually red and tender. Cellulitis occurs most often in the arms and lower legs.  CAUSES  Cellulitis is caused by bacteria that enter the skin through cracks or cuts in the skin. The most common types of bacteria that cause cellulitis are staphylococci and streptococci. SIGNS AND SYMPTOMS   Redness and warmth.  Swelling.  Tenderness or pain.  Fever. DIAGNOSIS  Your health care provider can usually determine what is wrong based on a physical exam. Blood tests may also be done. TREATMENT  Treatment usually involves taking an antibiotic medicine. HOME CARE INSTRUCTIONS   Take your antibiotic medicine as directed by your health care provider. Finish the antibiotic even if you start to feel better.  Keep the infected arm or leg elevated to reduce swelling.  Apply a warm cloth to the affected area up to 4 times per day to relieve pain.  Take medicines only as directed by your health care provider.  Keep all follow-up visits as directed by your health care provider. SEEK MEDICAL CARE IF:   You notice red streaks coming from the infected area.  Your red area gets larger or turns dark in color.  Your bone or joint underneath the infected area becomes painful after the skin has healed.  Your infection returns in the same area or another area.  You notice a swollen bump in the infected area.  You develop new symptoms.  You have a fever. SEEK IMMEDIATE MEDICAL CARE IF:   You feel very sleepy.  You develop vomiting or diarrhea.  You have a general ill feeling (malaise) with muscle aches and pains.   This information is not intended to replace advice given to you by your health care provider. Make sure you discuss any questions  you have with your health care provider.   Document Released: 10/12/2004 Document Revised: 09/23/2014 Document Reviewed: 03/20/2011 Elsevier Interactive Patient Education [employer].

## 2015-04-18 LAB — URINE CULTURE

## 2015-04-19 ENCOUNTER — Emergency Department
Admission: EM | Admit: 2015-04-19 | Discharge: 2015-04-19 | Disposition: A | Discharge status: Home / Self Care | Payer: Medicare Other | Attending: Student | Admitting: Student

## 2015-04-19 ENCOUNTER — Emergency Department: Payer: Medicare Other

## 2015-04-19 ENCOUNTER — Encounter: Payer: Self-pay | Admitting: Emergency Medicine

## 2015-04-19 DIAGNOSIS — R197 Diarrhea, unspecified: Secondary | ICD-10-CM

## 2015-04-19 DIAGNOSIS — Z881 Allergy status to other antibiotic agents status: Secondary | ICD-10-CM | POA: Insufficient documentation

## 2015-04-19 DIAGNOSIS — Z87891 Personal history of nicotine dependence: Secondary | ICD-10-CM | POA: Insufficient documentation

## 2015-04-19 DIAGNOSIS — I10 Essential (primary) hypertension: Secondary | ICD-10-CM | POA: Insufficient documentation

## 2015-04-19 DIAGNOSIS — Z880 Allergy status to penicillin: Secondary | ICD-10-CM | POA: Insufficient documentation

## 2015-04-19 DIAGNOSIS — R1030 Lower abdominal pain, unspecified: Secondary | ICD-10-CM | POA: Insufficient documentation

## 2015-04-19 DIAGNOSIS — R1110 Vomiting, unspecified: Secondary | ICD-10-CM | POA: Insufficient documentation

## 2015-04-19 DIAGNOSIS — E039 Hypothyroidism, unspecified: Secondary | ICD-10-CM | POA: Insufficient documentation

## 2015-04-19 DIAGNOSIS — Z91030 Bee allergy status: Secondary | ICD-10-CM | POA: Insufficient documentation

## 2015-04-19 DIAGNOSIS — Z9049 Acquired absence of other specified parts of digestive tract: Secondary | ICD-10-CM | POA: Insufficient documentation

## 2015-04-19 DIAGNOSIS — F319 Bipolar disorder, unspecified: Secondary | ICD-10-CM | POA: Insufficient documentation

## 2015-04-19 DIAGNOSIS — Z79899 Other long term (current) drug therapy: Secondary | ICD-10-CM | POA: Insufficient documentation

## 2015-04-19 DIAGNOSIS — Z9109 Other allergy status, other than to drugs and biological substances: Secondary | ICD-10-CM | POA: Insufficient documentation

## 2015-04-19 DIAGNOSIS — C4490 Unspecified malignant neoplasm of skin, unspecified: Secondary | ICD-10-CM | POA: Insufficient documentation

## 2015-04-19 DIAGNOSIS — Z886 Allergy status to analgesic agent status: Secondary | ICD-10-CM | POA: Insufficient documentation

## 2015-04-19 DIAGNOSIS — Z885 Allergy status to narcotic agent status: Secondary | ICD-10-CM | POA: Insufficient documentation

## 2015-04-19 DIAGNOSIS — K529 Noninfective gastroenteritis and colitis, unspecified: Secondary | ICD-10-CM | POA: Insufficient documentation

## 2015-04-19 DIAGNOSIS — Z888 Allergy status to other drugs, medicaments and biological substances status: Secondary | ICD-10-CM | POA: Insufficient documentation

## 2015-04-19 DIAGNOSIS — Z91010 Allergy to peanuts: Secondary | ICD-10-CM | POA: Insufficient documentation

## 2015-04-19 LAB — COMPREHENSIVE METABOLIC PANEL
ALBUMIN: 4 g/dL (ref 3.5–5.0)
ALK PHOS: 79 U/L (ref 38–126)
ALT: 37 U/L (ref 14–54)
ANION GAP: 12 (ref 5–15)
AST: 59 U/L — ABNORMAL HIGH (ref 15–41)
BUN: 8 mg/dL (ref 6–20)
CALCIUM: 9.1 mg/dL (ref 8.9–10.3)
CHLORIDE: 93 mmol/L — AB (ref 101–111)
CO2: 21 mmol/L — AB (ref 22–32)
Creatinine, Ser: 1.01 mg/dL — ABNORMAL HIGH (ref 0.44–1.00)
GFR calc Af Amer: >60 mL/min (ref >60)
GFR calc non Af Amer: 56 mL/min — ABNORMAL LOW (ref >60)
GLUCOSE: 97 mg/dL (ref 65–99)
Potassium: 3.3 mmol/L — ABNORMAL LOW (ref 3.5–5.1)
SODIUM: 126 mmol/L — AB (ref 135–145)
Total Bilirubin: 0.8 mg/dL (ref 0.3–1.2)
Total Protein: 7.4 g/dL (ref 6.5–8.1)

## 2015-04-19 LAB — LIPASE, BLOOD: LIPASE: 18 U/L (ref 11–51)

## 2015-04-19 LAB — URINALYSIS COMPLETE WITH MICROSCOPIC (ARMC ONLY)
Bilirubin Urine: NEGATIVE
Glucose, UA: NEGATIVE mg/dL
Hgb urine dipstick: NEGATIVE
Ketones, ur: NEGATIVE mg/dL
Nitrite: NEGATIVE
Protein, ur: NEGATIVE mg/dL
Specific Gravity, Urine: 1.001 — ABNORMAL LOW (ref 1.005–1.030)
TRANS EPITHEL UA: 2
pH: 9 — ABNORMAL HIGH (ref 5.0–8.0)

## 2015-04-19 LAB — CBC
HEMATOCRIT: 39.8 % (ref 35.0–47.0)
HEMOGLOBIN: 13.7 g/dL (ref 12.0–16.0)
MCH: 30.8 pg (ref 26.0–34.0)
MCHC: 34.5 g/dL (ref 32.0–36.0)
MCV: 89.4 fL (ref 80.0–100.0)
Platelets: 331 10*3/uL (ref 150–440)
RBC: 4.45 MIL/uL (ref 3.80–5.20)
RDW: 13.4 % (ref 11.5–14.5)
WBC: 14.5 10*3/uL — ABNORMAL HIGH (ref 3.6–11.0)

## 2015-04-19 MED ORDER — CIPROFLOXACIN HCL 500 MG PO TABS: 500.0000 mg | ORAL_TABLET | Freq: Once | ORAL | Status: DC

## 2015-04-19 MED ORDER — MORPHINE SULFATE (PF) 4 MG/ML IV SOLN
4.0000 mg | Freq: Once | INTRAVENOUS | Status: AC
  Administered 2015-04-19: 4 mg via INTRAVENOUS
  Filled 2015-04-19: qty 1

## 2015-04-19 MED ORDER — DIATRIZOATE MEGLUMINE & SODIUM 66-10 % PO SOLN
15.0000 mL | Freq: Once | ORAL | Status: AC
  Administered 2015-04-19: 30 mL via ORAL

## 2015-04-19 MED ORDER — CIPROFLOXACIN HCL 500 MG PO TABS: 500.0000 mg | ORAL_TABLET | Freq: Two times a day (BID) | ORAL | Status: DC

## 2015-04-19 MED ORDER — ONDANSETRON HCL 4 MG/2ML IJ SOLN
4.0000 mg | Freq: Once | INTRAMUSCULAR | Status: AC
  Administered 2015-04-19: 4 mg via INTRAVENOUS
  Filled 2015-04-19: qty 2

## 2015-04-19 MED ORDER — CIPROFLOXACIN HCL 500 MG PO TABS
ORAL_TABLET | ORAL | Status: AC
  Filled 2015-04-19: qty 1

## 2015-04-19 MED ORDER — IOPAMIDOL (ISOVUE-300) INJECTION 61%
75.0000 mL | Freq: Once | INTRAVENOUS | Status: AC | PRN
  Administered 2015-04-19: 75 mL via INTRAVENOUS

## 2015-04-19 MED ORDER — METRONIDAZOLE 500 MG PO TABS: 500.0000 mg | ORAL_TABLET | Freq: Three times a day (TID) | ORAL | Status: DC

## 2015-04-19 MED ORDER — METRONIDAZOLE 500 MG PO TABS
500.0000 mg | ORAL_TABLET | Freq: Once | ORAL | Status: AC
  Administered 2015-04-19: 500 mg via ORAL

## 2015-04-19 MED ORDER — BARIUM SULFATE 2.1 % PO SUSP: 450.0000 mL | ORAL | Status: DC

## 2015-04-19 MED ORDER — SODIUM CHLORIDE 0.9 % IV BOLUS (SEPSIS)
1000.0000 mL | Freq: Once | INTRAVENOUS | Status: AC
  Administered 2015-04-19: 1000 mL via INTRAVENOUS

## 2015-04-19 MED ORDER — METRONIDAZOLE 500 MG PO TABS
ORAL_TABLET | ORAL | Status: AC
  Administered 2015-04-19: 500 mg via ORAL
  Filled 2015-04-19: qty 1

## 2015-04-19 NOTE — Discharge Instructions (Signed)
Please seek medical attention for any high fevers, chest pain, shortness of breath, change in behavior, persistent vomiting, bloody stool or any other new or concerning symptoms.   Colitis Colitis is inflammation of the colon. Colitis may last a short time (acute) or it may last a long time (chronic). CAUSES This condition may be caused by:  Viruses.  Bacteria.  Reactions to medicine.  Certain autoimmune diseases, such as Crohn disease or ulcerative colitis. SYMPTOMS Symptoms of this condition include:  Diarrhea.  Passing bloody or tarry stool.  Pain.  Fever.  Vomiting.  Tiredness (fatigue).  Weight loss.  Bloating.  Sudden increase in abdominal pain.  Having fewer bowel movements than usual. DIAGNOSIS This condition is diagnosed with a stool test or a blood test. You may also have other tests, including X-rays, a CT scan, or a colonoscopy. TREATMENT Treatment may include:  Resting the bowel. This involves not eating or drinking for a period of time.  Fluids that are given through an IV tube.  Medicine for pain and diarrhea.  Antibiotic medicines.  Cortisone medicines.  Surgery. HOME CARE INSTRUCTIONS Eating and Drinking  Follow instructions from your health care provider about eating or drinking restrictions.  Drink enough fluid to keep your urine clear or pale yellow.  Work with a dietitian to determine which foods cause your condition to flare up.  Avoid foods that cause flare-ups.  Eat a well-balanced diet. Medicines  Take over-the-counter and prescription medicines only as told by your health care provider.  If you were prescribed an antibiotic medicine, take it as told by your health care provider. Do not stop taking the antibiotic even if you start to feel better. General Instructions  Keep all follow-up visits as told by your health care provider. This is important. SEEK MEDICAL CARE IF:  Your symptoms do not go away.  You develop  new symptoms. SEEK IMMEDIATE MEDICAL CARE IF:  You have a fever that does not go away with treatment.  You develop chills.  You have extreme weakness, fainting, or dehydration.  You have repeated vomiting.  You develop severe pain in your abdomen.  You pass bloody or tarry stool.   This information is not intended to replace advice given to you by your health care provider. Make sure you discuss any questions you have with your health care provider.   Document Released: 02/10/2004 Document Revised: 09/23/2014 Document Reviewed: 04/27/2014 Elsevier Interactive Patient Education [employer].

## 2015-04-19 NOTE — Progress Notes (Signed)
GYNECOLOGY PROGRESS NOTE  Subjective:    Patient ID: Angela [name], female    DOB: [birth year], 65 y.o.   MRN: [id number]  HPI  Patient is a 65 y.o. [id number] female who presents for follow up and review of records. Patient previously seen for persistent episodic postmenopausal vaginal spotting/bleeding.  Notes that she has not had any further episodes since prior to last visit. Notes that has decreased her use of premarin cream to daily (was using twice daily), however notes that she cannot decrease to less than this due to persistent vulvar itching and irritation.   The following portions of the patient's history were reviewed and updated as appropriate: allergies, current medications, past family history, past medical history, past social history, past surgical history and problem list.  Review of Systems A comprehensive review of systems was negative except for: Behavioral/Psych: positive for depression and grief.  Reports the death of her boyfriend several weeks ago.  Has been seeing a counselor, but doesn't think this helps much.  Denies SI/HI.  Is taking medications as prescribed.  Patient also thinks that she may be getting a yeast infection, as she has had several in the recent past.   Objective:   Blood pressure 135/78, pulse 76, height 5\' 3"  (1.6 m), weight 119 lb (53.978 kg). General appearance: alert and no distress Abdomen: soft, non-tender; bowel sounds normal; no masses,  no organomegaly Pelvic: external genitalia mildly atrophic, several small excoriations/bumps noted at base of right labia majora. Vagina atrophic, no lesions.  Small amount of vaginal discharge noted in vault. Cervix without lesions.  Bimanual exam not performed.  Extremities: extremities normal, atraumatic, no cyanosis or edema Neurologic: Grossly normal  Microscopic wet-mount exam shows negative for pathogens, normal epithelial cells.  Assessment:   Vulvar irritation PMB (none currently) H/o yeast  vaginitis  Plan:   - Discussion had with patient that persistent vulvar irritation may be an indication of another underlying disorder (i.e. Lichens), and may not respond to estrogen therapy.  Discussed need to perform vulvar biopsy.  Patient notes understanding, willing to have it performed today. See separate procedure note.  -  Discussion also had with patient regarding recommendations from [facility] and records reviewed from [place].  Was recommended that based on patient's persistent overuse of estrogen cream, and episodic vaginal bleeding despite normal endometrial biopsy, that patient undergo D&C.  Patient notes understanding.  Desires to have it performed at the beginning of May, as she will be traveling for her nephew's graduation and also attending a wedding during next month.  Will have patient f/u at the end of next month for pre-op exam.  - Wet prep notes no evidence of yeast infection. However patient notes that she can feel when one is coming on.  Will prescribe Diflucan tablets so if patient does develop a yeast infection at some point, she can use prescription.     VULVAR BIOPSY NOTE The indications for vulvar biopsy (rule out neoplasia, establish lichen sclerosus diagnosis) were reviewed.   Risks of the biopsy including pain, bleeding, infection, inadequate specimen, scarring and need for additional procedures  were discussed. The patient stated understanding and agreed to undergo procedure today. Consent was signed,  time out performed.  The patient's vulva was prepped with Betadine. 1% lidocaine was injected into the right labia majora (approximately 2 ml). Two 3-mm punch biopsies were done, biopsy tissue was picked up with sterile forceps and sterile scissors were used to excise the lesions.  Small bleeding  was noted and hemostasis was achieved using silver nitrate sticks.  The patient tolerated the procedure well. Post-procedure instructions  (pelvic rest for one week) were given to the  patient. The patient is to call with heavy bleeding, fever greater than 100.4, foul smelling vaginal discharge or other concerns. The patient will be return to clinic in two weeks for discussion of results.   Rubie Maid, MD [facility]

## 2015-04-19 NOTE — ED Notes (Signed)
States abdominal pain, nausea vomiting and diarrhea since yesterday. History of diverticulitis.

## 2015-04-19 NOTE — ED Provider Notes (Signed)
[facility] Emergency Department Provider Note  ____________________________________________  Time seen: Approximately 1:55 PM  I have reviewed the triage vital signs and the nursing notes.   HISTORY  Chief Complaint Abdominal Pain    HPI Angela [name] is a 65 y.o. female with history of bipolar disorder, hypertension, history of diverticulitis who presents for evaluation of one week of lower abdominal discomfort as well as 2 days of recurrent nonbloody nonbilious emesis as well as diarrhea, constant since onset, severe associated with subjective fevers and chills. She was seen in this emergency department 04/16/2015 for problems related to a labial biopsy as well as some abdominal pain. She was discharged with Keflex for possible labial cellulitis. She underwent CT scan at that time which showed only minimal soft tissue associated with the labia. She reports her symptoms have worsened and she feels as if she has recurrent diverticulitis. No chest pain difficulty breathing. No coughing, sneezing, runny nose, congestion.   Past Medical History  Diagnosis Date  . Endometriosis   . Hypothyroidism   . GERD (gastroesophageal reflux disease)   . Bipolar 1 disorder ([place])   . Hypertension   . Hernia, hiatal   . Allergic rhinitis   . Bronchitis   . Endometriosis   . Otalgia   . Otitis media   . Chronic sinusitis   . skin cancer   . Malignant neoplasm of skin   . Facial pain   . Dizziness   . Blurred vision   . Neuropathy ([place])   . Thyroid disease   . Anemia   . Depression   . Migraine   . Chronic constipation   . Diverticulitis   . Diverticulosis     Patient Active Problem List   Diagnosis Date Noted  . Pelvic pain in female 10/30/2014    Past Surgical History  Procedure Laterality Date  . Tonsillectomy    . Uteral suspension    . Appendectomy    . Hernia repair    . Laproscopy    . Cholecystectomy, laparoscopic    . Cholecystectomy    .  Esophagogastroduodenoscopy (egd) with propofol N/A 03/29/2015    Procedure: ESOPHAGOGASTRODUODENOSCOPY (EGD) WITH PROPOFOL;  Surgeon: Josefine Class, MD;  Location: [facility] ENDOSCOPY;  Service: Endoscopy;  Laterality: N/A;    Current Outpatient Rx  Name  Route  Sig  Dispense  Refill  . acetaminophen (RA ACETAMINOPHEN) 650 MG CR tablet   Oral   Take by mouth.         Marland Kitchen amLODipine (NORVASC) 2.5 MG tablet   Oral   Take 2.5 mg by mouth daily.         . Biotin 5000 MCG CAPS   Oral   Take 1 capsule by mouth daily.          . calcium carbonate (OS-CAL) 600 MG TABS tablet   Oral   Take 600 mg by mouth 3 (three) times daily with meals.         . calcium carbonate (OSCAL) 1500 (600 Ca) MG TABS tablet   Oral   Take by mouth.         . cephALEXin (KEFLEX) 500 MG capsule   Oral   Take 1 capsule (500 mg total) by mouth 4 (four) times daily.   40 capsule   0   . cetirizine (ZYRTEC) 10 MG tablet   Oral   Take 10 mg by mouth daily.         . Cholecalciferol (  VITAMIN D-1000 MAX ST) 1000 units tablet   Oral   Take by mouth.         . conjugated estrogens (PREMARIN) vaginal cream   Vaginal   Place 1 Applicatorful vaginally daily.         . cycloSPORINE (RESTASIS) 0.05 % ophthalmic emulsion   Ophthalmic   Apply to eye.         . divalproex (DEPAKOTE) 500 MG DR tablet   Oral   Take 500 mg by mouth 3 (three) times daily.         Marland Kitchen EPINEPHrine (EPIPEN 2-PAK) 0.3 mg/0.3 mL IJ SOAJ injection   Subcutaneous   Inject 0.3 mg into the skin.         Marland Kitchen erythromycin ophthalmic ointment      1 application at bedtime.         Marland Kitchen estradiol (ESTRING) 2 MG vaginal ring   Vaginal   Place 2 mg vaginally every 3 (three) months. follow package directions   1 each   12   . fluconazole (DIFLUCAN) 150 MG tablet   Oral   Take 1 tablet (150 mg total) by mouth once. Can take additional dose three days later if symptoms persist   1 tablet   3   .  HYDROcodone-acetaminophen (NORCO/VICODIN) 5-325 MG per tablet   Oral   Take 1 tablet by mouth every 6 (six) hours as needed for moderate pain.   5 tablet   0   . hydrocortisone (ANUSOL-HC) 2.5 % rectal cream   Rectal   Place 1 application rectally 2 (two) times daily.         . hydrocortisone (ANUSOL-HC) 2.5 % rectal cream   Topical   Apply topically.         . Hypromellose (GENTEAL MILD) 0.2 % SOLN   Ophthalmic   Apply to eye.         Marland Kitchen ipratropium (ATROVENT) 0.03 % nasal spray   Each Nare   Place 2 sprays into both nostrils every 12 (twelve) hours.         Marland Kitchen LACTOBACILLUS EXTRA STRENGTH CAPS   Oral   Take by mouth.         Marland Kitchen LACTOBACILLUS PO   Oral   Take by mouth.         . lactulose (CEPHULAC) 10 G packet   Oral   Take 1 packet (10 g total) by mouth daily as needed (constipation).   10 each   0   . levothyroxine (SYNTHROID, LEVOTHROID) 88 MCG tablet   Oral   Take 88 mcg by mouth daily before breakfast.         . levothyroxine (SYNTHROID, LEVOTHROID) 88 MCG tablet   Oral   Take by mouth.         . lidocaine (XYLOCAINE) 2 % solution               . Lifitegrast (XIIDRA) 5 % SOLN   Ophthalmic   Apply to eye.         . Linaclotide (LINZESS) 290 MCG CAPS capsule   Oral   Take 290 mcg by mouth daily.         . magnesium hydroxide (MILK OF MAGNESIA) 400 MG/5ML suspension   Oral   Take by mouth daily as needed for mild constipation.         . magnesium sulfate (EPSOM SALTS) crystals      Use 1/2 cup X one quart warm  water daily.   1816 g   3   . Menthol-Methyl Salicylate (ICY HOT EXTRA STRENGTH) 10-30 % CREA   Apply externally   Apply topically.         . multivitamin-iron-minerals-folic acid (THERAPEUTIC-M) TABS tablet   Oral   Take 1 tablet by mouth daily.         . Omega-3 Krill Oil 1000 MG CAPS   Oral   Take by mouth.         . ondansetron (ZOFRAN-ODT) 8 MG disintegrating tablet   Oral   Take 8 mg by mouth  every 8 (eight) hours as needed for nausea or vomiting.         . pantoprazole (PROTONIX) 40 MG tablet   Oral   Take 40 mg by mouth daily.         Marland Kitchen PARoxetine (PAXIL) 30 MG tablet   Oral   Take 30 mg by mouth daily.         . promethazine (PHENERGAN) 12.5 MG tablet   Oral   Take 1 tablet (12.5 mg total) by mouth every 6 (six) hours as needed for nausea or vomiting.   15 tablet   0   . promethazine (PHENERGAN) 25 MG tablet   Oral   Take 1 tablet (25 mg total) by mouth every 6 (six) hours as needed for nausea or vomiting.   10 tablet   0   . Spirulina 500 MG TABS   Oral   Take by mouth.         . terbinafine (LAMISIL) 1 % cream   Topical   Apply 1 application topically 2 (two) times daily.         Marland Kitchen terconazole (TERAZOL 7) 0.4 % vaginal cream   Vaginal   Place 1 applicator vaginally at bedtime.         Marland Kitchen tiZANidine (ZANAFLEX) 4 MG tablet   Oral   Take 4 mg by mouth every 6 (six) hours as needed for muscle spasms.         Marland Kitchen triamcinolone cream (KENALOG) 0.1 %   Topical   Apply 1 application topically 2 (two) times daily.         Marland Kitchen zolpidem (AMBIEN) 10 MG tablet   Oral   Take 10 mg by mouth at bedtime as needed for sleep.           Allergies Bee venom; Darvon; Peanuts; Penicillins; Percocet; Sulfa antibiotics; Sulfacetamide sodium; Adhesive; Amikacin; Aspirin; Bee pollen; Doxycycline; Epinephrine; Haloperidol; Hydroxyzine; Hymenoptera venom preparations; Ibuprofen; Imipramine; Morphine and related; Prednisone; Silicon; Sulfur; and Ultram  Family History  Problem Relation Age of Onset  . Heart disease Father   . Cancer Mother     lung  . Urolithiasis Neg Hx   . Kidney disease Neg Hx   . Kidney cancer Neg Hx   . Prostate cancer Neg Hx     Social History Social History  Substance Use Topics  . Smoking status: Former Research scientist (life sciences)  . Smokeless tobacco: Never Used  . Alcohol Use: No    Review of Systems Constitutional: + subjective  fever/chills Eyes: No visual changes. ENT: No sore throat. Cardiovascular: Denies chest pain. Respiratory: Denies shortness of breath. Gastrointestinal: + abdominal pain.  + nausea, + vomiting.  + diarrhea.  No constipation. Genitourinary: Negative for dysuria. Musculoskeletal: Negative for back pain. Skin: Negative for rash. Neurological: Negative for headaches, focal weakness or numbness.  10-point ROS otherwise negative.  ____________________________________________   PHYSICAL  EXAM:  VITAL SIGNS: ED Triage Vitals  Enc Vitals Group     BP 04/19/15 1333 140/108 mmHg     Pulse Rate 04/19/15 1333 64     Resp 04/19/15 1333 18     Temp 04/19/15 1333 98.6 F (37 C)     Temp Source 04/19/15 1333 Oral     SpO2 04/19/15 1333 100 %     Weight 04/19/15 1333 119 lb (53.978 kg)     Height 04/19/15 1333 5\' 3"  (1.6 m)     Head Cir --      Peak Flow --      Pain Score 04/19/15 1334 9     Pain Loc --      Pain Edu? --      Excl. in [place]? --     Constitutional: Alert and oriented. In distress due to pain. Eyes: Conjunctivae are normal. PERRL. EOMI. Head: Atraumatic. Nose: No congestion/rhinnorhea. Mouth/Throat: Mucous membranes are moist.  Oropharynx non-erythematous. Neck: No stridor.  Supple without meningismus. Cardiovascular: Normal rate, regular rhythm. Grossly normal heart sounds.  Good peripheral circulation. Respiratory: Normal respiratory effort.  No retractions. Lungs CTAB. Gastrointestinal: Soft with diffuse lower abdominal tenderness. No CVA tenderness. Genitourinary: labial biopsy wounds are hearing well, minimal associated erythema, No purulent drainage Musculoskeletal: No lower extremity tenderness nor edema.  No joint effusions. Neurologic:  Normal speech and language. No gross focal neurologic deficits are appreciated.  Skin:  Skin is warm, dry and intact. No rash noted. Psychiatric: Mood and affect are normal. Speech and behavior are  normal.  ____________________________________________   LABS (all labs ordered are listed, but only abnormal results are displayed)  Labs Reviewed  COMPREHENSIVE METABOLIC PANEL - Abnormal; Notable for the following:    Sodium 126 (*)    Potassium 3.3 (*)    Chloride 93 (*)    CO2 21 (*)    Creatinine, Ser 1.01 (*)    AST 59 (*)    GFR calc non Af Amer 56 (*)    All other components within normal limits  CBC - Abnormal; Notable for the following:    WBC 14.5 (*)    All other components within normal limits  URINALYSIS COMPLETEWITH MICROSCOPIC ([facility] ONLY) - Abnormal; Notable for the following:    Color, Urine STRAW (*)    APPearance CLEAR (*)    Specific Gravity, Urine 1.001 (*)    pH 9.0 (*)    Leukocytes, UA 1+ (*)    Bacteria, UA RARE (*)    Squamous Epithelial / LPF 6-30 (*)    All other components within normal limits  LIPASE, BLOOD   ____________________________________________  EKG  ED ECG REPORT I, Joanne Gavel, the attending physician, personally viewed and interpreted this ECG.   Date: 04/19/2015  EKG Time: 13:33  Rate: 63  Rhythm: normal sinus rhythm  Axis: normal  Intervals:none  ST&T Change: No acute ST elevation.  ____________________________________________  RADIOLOGY  CT abdomen and pelvis - pending ____________________________________________   PROCEDURES  Procedure(s) performed: None  Critical Care performed: No  ____________________________________________   INITIAL IMPRESSION / ASSESSMENT AND PLAN / ED COURSE  Pertinent labs & imaging results that were available during my care of the patient were reviewed by me and considered in my medical decision making (see chart for details).  Angela [name] is a 65 y.o. female with history of bipolar disorder, hypertension, history of diverticulitis who presents for evaluation of one week of lower abdominal discomfort as well as  2 days of recurrent nonbloody nonbilious emesis as well as  diarrhea. On exam, she is in distress secondary to pain. Vital signs are stable, she is afebrile. She does have diffuse lower abdominal tenderness to palpation which is quite significant. Labia appear to be healing well. Labs are notable for new hyponatremia with sodium 126, we'll give IV fluids. Additionally she does have significant leukocytosis today, white blood count IS ELEVATED AT 14.5. URINALYSIS WITH 1+ LEUKOCYTES, SEVERAL WBCs BUT ALSO MULTIPLE SQUAMOUS CELLS. GIVEN DEGREE OF TENDERNESS, NEW LEUKOCYTOSIS, WE'LL REPEAT CT OF THE ABDOMEN AND PELVIS TO EVALUATE FOR EVIDENCE of diverticulitis (my concern would be for complicated diverticulitis). WE'LL TREAT HER PAIN. CT SCAN PENDING AT THIS TIME. CARE TRANSFERRED TO DR. Archie Balboa AT 3:19 PM. ____________________________________________   FINAL CLINICAL IMPRESSION(S) / ED DIAGNOSES  Final diagnoses:  Lower abdominal pain  Vomiting and diarrhea      Joanne Gavel, MD 04/19/15 1520

## 2015-04-19 NOTE — ED Provider Notes (Signed)
-----------------------------------------   5:39 PM on 04/19/2015 -----------------------------------------  CT abdomen and pelvis is returned and is significant for possible colitis. The patient does state she has a history of diverticulitis. Will place on antibiotics. Will encourage primary care follow-up.  Nance Pear, MD 04/19/15 1910

## 2015-04-20 ENCOUNTER — Emergency Department: Payer: Medicare Other

## 2015-04-20 ENCOUNTER — Inpatient Hospital Stay
Admission: EM | Admit: 2015-04-20 | Discharge: 2015-04-26 | DRG: 394 | Disposition: A | Discharge status: Skilled Nursing Facility | Payer: Medicare Other | Attending: Internal Medicine | Admitting: Internal Medicine

## 2015-04-20 ENCOUNTER — Encounter: Payer: Self-pay | Admitting: Emergency Medicine

## 2015-04-20 DIAGNOSIS — N950 Postmenopausal bleeding: Secondary | ICD-10-CM | POA: Diagnosis present

## 2015-04-20 DIAGNOSIS — Z8673 Personal history of transient ischemic attack (TIA), and cerebral infarction without residual deficits: Secondary | ICD-10-CM | POA: Diagnosis not present

## 2015-04-20 DIAGNOSIS — Z79899 Other long term (current) drug therapy: Secondary | ICD-10-CM | POA: Diagnosis not present

## 2015-04-20 DIAGNOSIS — K219 Gastro-esophageal reflux disease without esophagitis: Secondary | ICD-10-CM | POA: Diagnosis present

## 2015-04-20 DIAGNOSIS — Z8249 Family history of ischemic heart disease and other diseases of the circulatory system: Secondary | ICD-10-CM

## 2015-04-20 DIAGNOSIS — E039 Hypothyroidism, unspecified: Secondary | ICD-10-CM | POA: Diagnosis present

## 2015-04-20 DIAGNOSIS — K559 Vascular disorder of intestine, unspecified: Principal | ICD-10-CM | POA: Diagnosis present

## 2015-04-20 DIAGNOSIS — Z87891 Personal history of nicotine dependence: Secondary | ICD-10-CM

## 2015-04-20 DIAGNOSIS — Z888 Allergy status to other drugs, medicaments and biological substances status: Secondary | ICD-10-CM

## 2015-04-20 DIAGNOSIS — R109 Unspecified abdominal pain: Secondary | ICD-10-CM

## 2015-04-20 DIAGNOSIS — E871 Hypo-osmolality and hyponatremia: Secondary | ICD-10-CM | POA: Diagnosis present

## 2015-04-20 DIAGNOSIS — Z7951 Long term (current) use of inhaled steroids: Secondary | ICD-10-CM | POA: Diagnosis not present

## 2015-04-20 DIAGNOSIS — F319 Bipolar disorder, unspecified: Secondary | ICD-10-CM | POA: Diagnosis present

## 2015-04-20 DIAGNOSIS — M549 Dorsalgia, unspecified: Secondary | ICD-10-CM | POA: Diagnosis present

## 2015-04-20 DIAGNOSIS — K529 Noninfective gastroenteritis and colitis, unspecified: Secondary | ICD-10-CM | POA: Diagnosis not present

## 2015-04-20 DIAGNOSIS — R1110 Vomiting, unspecified: Secondary | ICD-10-CM

## 2015-04-20 DIAGNOSIS — Z880 Allergy status to penicillin: Secondary | ICD-10-CM | POA: Diagnosis not present

## 2015-04-20 DIAGNOSIS — K449 Diaphragmatic hernia without obstruction or gangrene: Secondary | ICD-10-CM | POA: Diagnosis present

## 2015-04-20 DIAGNOSIS — G459 Transient cerebral ischemic attack, unspecified: Secondary | ICD-10-CM

## 2015-04-20 DIAGNOSIS — E876 Hypokalemia: Secondary | ICD-10-CM | POA: Diagnosis present

## 2015-04-20 DIAGNOSIS — Z885 Allergy status to narcotic agent status: Secondary | ICD-10-CM | POA: Diagnosis not present

## 2015-04-20 DIAGNOSIS — R4701 Aphasia: Secondary | ICD-10-CM | POA: Diagnosis not present

## 2015-04-20 DIAGNOSIS — Z915 Personal history of self-harm: Secondary | ICD-10-CM

## 2015-04-20 DIAGNOSIS — Z818 Family history of other mental and behavioral disorders: Secondary | ICD-10-CM | POA: Diagnosis not present

## 2015-04-20 DIAGNOSIS — K5900 Constipation, unspecified: Secondary | ICD-10-CM | POA: Diagnosis present

## 2015-04-20 DIAGNOSIS — Z801 Family history of malignant neoplasm of trachea, bronchus and lung: Secondary | ICD-10-CM

## 2015-04-20 DIAGNOSIS — K921 Melena: Secondary | ICD-10-CM | POA: Diagnosis present

## 2015-04-20 DIAGNOSIS — I10 Essential (primary) hypertension: Secondary | ICD-10-CM | POA: Diagnosis present

## 2015-04-20 DIAGNOSIS — K317 Polyp of stomach and duodenum: Secondary | ICD-10-CM | POA: Diagnosis present

## 2015-04-20 DIAGNOSIS — Z85828 Personal history of other malignant neoplasm of skin: Secondary | ICD-10-CM

## 2015-04-20 DIAGNOSIS — Z9049 Acquired absence of other specified parts of digestive tract: Secondary | ICD-10-CM

## 2015-04-20 DIAGNOSIS — Z882 Allergy status to sulfonamides status: Secondary | ICD-10-CM

## 2015-04-20 DIAGNOSIS — Z9889 Other specified postprocedural states: Secondary | ICD-10-CM | POA: Diagnosis not present

## 2015-04-20 DIAGNOSIS — R197 Diarrhea, unspecified: Secondary | ICD-10-CM

## 2015-04-20 LAB — CBC WITH DIFFERENTIAL/PLATELET
BASOS PCT: 0 %
Basophils Absolute: 0 10*3/uL (ref 0–0.1)
EOS ABS: 0.1 10*3/uL (ref 0–0.7)
EOS PCT: 1 %
HCT: 38.3 % (ref 35.0–47.0)
Hemoglobin: 12.9 g/dL (ref 12.0–16.0)
Lymphocytes Relative: 12 %
Lymphs Abs: 1.4 10*3/uL (ref 1.0–3.6)
MCH: 30.9 pg (ref 26.0–34.0)
MCHC: 33.8 g/dL (ref 32.0–36.0)
MCV: 91.6 fL (ref 80.0–100.0)
MONOS PCT: 8 %
Monocytes Absolute: 0.9 10*3/uL (ref 0.2–0.9)
NEUTROS PCT: 79 %
Neutro Abs: 9.3 10*3/uL — ABNORMAL HIGH (ref 1.4–6.5)
PLATELETS: 313 10*3/uL (ref 150–440)
RBC: 4.18 MIL/uL (ref 3.80–5.20)
RDW: 13.5 % (ref 11.5–14.5)
WBC: 11.8 10*3/uL — AB (ref 3.6–11.0)

## 2015-04-20 LAB — COMPREHENSIVE METABOLIC PANEL
ALBUMIN: 3.8 g/dL (ref 3.5–5.0)
ALT: 25 U/L (ref 14–54)
ANION GAP: 6 (ref 5–15)
AST: 31 U/L (ref 15–41)
Alkaline Phosphatase: 74 U/L (ref 38–126)
BILIRUBIN TOTAL: 0.3 mg/dL (ref 0.3–1.2)
BUN: 5 mg/dL — ABNORMAL LOW (ref 6–20)
CALCIUM: 8.9 mg/dL (ref 8.9–10.3)
CO2: 24 mmol/L (ref 22–32)
Chloride: 104 mmol/L (ref 101–111)
Creatinine, Ser: 0.82 mg/dL (ref 0.44–1.00)
GFR calc Af Amer: >60 mL/min (ref >60)
Glucose, Bld: 80 mg/dL (ref 65–99)
POTASSIUM: 3.5 mmol/L (ref 3.5–5.1)
Sodium: 134 mmol/L — ABNORMAL LOW (ref 135–145)
TOTAL PROTEIN: 7.3 g/dL (ref 6.5–8.1)

## 2015-04-20 LAB — PATHOLOGY

## 2015-04-20 LAB — TROPONIN I: Troponin I: <0.03 ng/mL (ref <0.031)

## 2015-04-20 LAB — LIPASE, BLOOD: LIPASE: 16 U/L (ref 11–51)

## 2015-04-20 MED ORDER — SODIUM CHLORIDE 0.9 % IV BOLUS (SEPSIS)
1000.0000 mL | Freq: Once | INTRAVENOUS | Status: AC
  Administered 2015-04-20: 1000 mL via INTRAVENOUS

## 2015-04-20 MED ORDER — ONDANSETRON HCL 4 MG/2ML IJ SOLN
4.0000 mg | Freq: Once | INTRAMUSCULAR | Status: AC
  Administered 2015-04-20: 4 mg via INTRAVENOUS
  Filled 2015-04-20: qty 2

## 2015-04-20 MED ORDER — LORAZEPAM 2 MG/ML IJ SOLN
INTRAMUSCULAR | Status: AC
  Filled 2015-04-20: qty 1

## 2015-04-20 MED ORDER — LABETALOL HCL 5 MG/ML IV SOLN
20.0000 mg | Freq: Once | INTRAVENOUS | Status: AC
  Administered 2015-04-20: 20 mg via INTRAVENOUS

## 2015-04-20 MED ORDER — LABETALOL HCL 5 MG/ML IV SOLN
INTRAVENOUS | Status: AC
  Administered 2015-04-20: 20 mg via INTRAVENOUS
  Filled 2015-04-20: qty 4

## 2015-04-20 MED ORDER — LORAZEPAM 2 MG/ML IJ SOLN
1.0000 mg | Freq: Once | INTRAMUSCULAR | Status: AC
  Administered 2015-04-20: 1 mg via INTRAVENOUS

## 2015-04-20 NOTE — ED Notes (Signed)
Pt given ice chips

## 2015-04-20 NOTE — ED Provider Notes (Signed)
[facility] Emergency Department Provider Note   ____________________________________________  Time seen: Approximately 7:30 PM I have reviewed the triage vital signs and the triage nursing note.  HISTORY  Chief Complaint Diarrhea   Historian Patient  HPI Angela [name] is a 65 y.o. female is here with a complaint of vomiting and diarrhea for at least a week. She states that she's also had maroon stools and bright red blood per rectum, persistent over the past 2-3 days. She's been seen in the emergency department both on March 31, as well as April 3 which was yesterday. She had a CT scan yesterday showing what she was told was colitis and sent home with antibiotics. Patient states that she got home and was unable to keep her in a bike down due to nausea and vomiting. Nonbloody nonbilious vomiting. No fever, but positive for chills. She's had abdominal discomfort cramping diffusely, and persistent watery diarrhea with maroon stool and bright red blood per rectum.  History of following with gastrologist Dr.Rein, who reportedly did an endoscopy due to symptomatic hiatal hernia about 3 weeks ago, as well as colonoscopy.  All of these symptoms actually started after those scopes.  Patient lives alone and states that she hasn't been able to eat or drink, she is persistently vomiting and unable to keep her by mouth medications down, and has been diagnosed with colitis.  She is quite frustrated after having seen multiple physicians, and still feels like she is ill.    Past Medical History  Diagnosis Date  . Endometriosis   . Hypothyroidism   . GERD (gastroesophageal reflux disease)   . Bipolar 1 disorder ([place])   . Hypertension   . Hernia, hiatal   . Allergic rhinitis   . Bronchitis   . Endometriosis   . Otalgia   . Otitis media   . Chronic sinusitis   . skin cancer   . Malignant neoplasm of skin   . Facial pain   . Dizziness   . Blurred vision   .  Neuropathy ([place])   . Thyroid disease   . Anemia   . Depression   . Migraine   . Chronic constipation   . Diverticulitis   . Diverticulosis     Patient Active Problem List   Diagnosis Date Noted  . Colitis, acute 04/20/2015  . Melena 04/20/2015  . Pelvic pain in female 10/30/2014    Past Surgical History  Procedure Laterality Date  . Tonsillectomy    . Uteral suspension    . Appendectomy    . Hernia repair    . Laproscopy    . Cholecystectomy, laparoscopic    . Cholecystectomy    . Esophagogastroduodenoscopy (egd) with propofol N/A 03/29/2015    Procedure: ESOPHAGOGASTRODUODENOSCOPY (EGD) WITH PROPOFOL;  Surgeon: Josefine Class, MD;  Location[facility] ENDOSCOPY;  Service: Endoscopy;  Laterality: N/A;    Current Outpatient Rx  Name  Route  Sig  Dispense  Refill  . acetaminophen (RA ACETAMINOPHEN) 650 MG CR tablet   Oral   Take by mouth.         Marland Kitchen amLODipine (NORVASC) 2.5 MG tablet   Oral   Take 2.5 mg by mouth daily.         . Biotin 5000 MCG CAPS   Oral   Take 1 capsule by mouth daily.          . calcium carbonate (OS-CAL) 600 MG TABS tablet   Oral   Take 600 mg by  mouth 3 (three) times daily with meals.         . calcium carbonate (OSCAL) 1500 (600 Ca) MG TABS tablet   Oral   Take by mouth.         . cephALEXin (KEFLEX) 500 MG capsule   Oral   Take 1 capsule (500 mg total) by mouth 4 (four) times daily.   40 capsule   0   . cetirizine (ZYRTEC) 10 MG tablet   Oral   Take 10 mg by mouth daily.         . Cholecalciferol (VITAMIN D-1000 MAX ST) 1000 units tablet   Oral   Take by mouth.         . ciprofloxacin (CIPRO) 500 MG tablet   Oral   Take 1 tablet (500 mg total) by mouth 2 (two) times daily.   14 tablet   0   . conjugated estrogens (PREMARIN) vaginal cream   Vaginal   Place 1 Applicatorful vaginally daily.         . cycloSPORINE (RESTASIS) 0.05 % ophthalmic emulsion   Ophthalmic   Apply to eye.         . divalproex  (DEPAKOTE) 500 MG DR tablet   Oral   Take 500 mg by mouth 3 (three) times daily.         Marland Kitchen EPINEPHrine (EPIPEN 2-PAK) 0.3 mg/0.3 mL IJ SOAJ injection   Subcutaneous   Inject 0.3 mg into the skin.         Marland Kitchen erythromycin ophthalmic ointment      1 application at bedtime.         Marland Kitchen estradiol (ESTRING) 2 MG vaginal ring   Vaginal   Place 2 mg vaginally every 3 (three) months. follow package directions Patient not taking: Reported on 04/20/2015   1 each   12   . fluconazole (DIFLUCAN) 150 MG tablet   Oral   Take 1 tablet (150 mg total) by mouth once. Can take additional dose three days later if symptoms persist   1 tablet   3   . HYDROcodone-acetaminophen (NORCO/VICODIN) 5-325 MG per tablet   Oral   Take 1 tablet by mouth every 6 (six) hours as needed for moderate pain.   5 tablet   0   . hydrocortisone (ANUSOL-HC) 2.5 % rectal cream   Rectal   Place 1 application rectally 2 (two) times daily.         . Hypromellose (GENTEAL MILD) 0.2 % SOLN   Ophthalmic   Apply to eye.         Marland Kitchen ipratropium (ATROVENT) 0.03 % nasal spray   Each Nare   Place 2 sprays into both nostrils every 12 (twelve) hours.         Marland Kitchen LACTOBACILLUS EXTRA STRENGTH CAPS   Oral   Take by mouth.         Marland Kitchen LACTOBACILLUS PO   Oral   Take by mouth.         . lactulose (CEPHULAC) 10 G packet   Oral   Take 1 packet (10 g total) by mouth daily as needed (constipation).   10 each   0   . levothyroxine (SYNTHROID, LEVOTHROID) 88 MCG tablet   Oral   Take 88 mcg by mouth daily before breakfast.         . lidocaine (XYLOCAINE) 2 % solution               . Lifitegrast (XIIDRA) 5 %  SOLN   Ophthalmic   Apply to eye.         . Linaclotide (LINZESS) 290 MCG CAPS capsule   Oral   Take 290 mcg by mouth daily.         . magnesium hydroxide (MILK OF MAGNESIA) 400 MG/5ML suspension   Oral   Take by mouth daily as needed for mild constipation.         . magnesium sulfate (EPSOM  SALTS) crystals      Use 1/2 cup X one quart warm water daily.   1816 g   3   . Menthol-Methyl Salicylate (ICY HOT EXTRA STRENGTH) 10-30 % CREA   Apply externally   Apply topically.         . metroNIDAZOLE (FLAGYL) 500 MG tablet   Oral   Take 1 tablet (500 mg total) by mouth 3 (three) times daily.   21 tablet   0   . multivitamin-iron-minerals-folic acid (THERAPEUTIC-M) TABS tablet   Oral   Take 1 tablet by mouth daily.         . Omega-3 Krill Oil 1000 MG CAPS   Oral   Take by mouth.         . ondansetron (ZOFRAN-ODT) 8 MG disintegrating tablet   Oral   Take 8 mg by mouth every 8 (eight) hours as needed for nausea or vomiting.         . pantoprazole (PROTONIX) 40 MG tablet   Oral   Take 40 mg by mouth daily.         Marland Kitchen PARoxetine (PAXIL) 30 MG tablet   Oral   Take 30 mg by mouth daily.         . promethazine (PHENERGAN) 12.5 MG tablet   Oral   Take 1 tablet (12.5 mg total) by mouth every 6 (six) hours as needed for nausea or vomiting.   15 tablet   0   . promethazine (PHENERGAN) 25 MG tablet   Oral   Take 1 tablet (25 mg total) by mouth every 6 (six) hours as needed for nausea or vomiting.   10 tablet   0   . Spirulina 500 MG TABS   Oral   Take by mouth.         . terbinafine (LAMISIL) 1 % cream   Topical   Apply 1 application topically 2 (two) times daily.         Marland Kitchen terconazole (TERAZOL 7) 0.4 % vaginal cream   Vaginal   Place 1 applicator vaginally at bedtime.         Marland Kitchen tiZANidine (ZANAFLEX) 4 MG tablet   Oral   Take 4 mg by mouth every 6 (six) hours as needed for muscle spasms.         Marland Kitchen triamcinolone cream (KENALOG) 0.1 %   Topical   Apply 1 application topically 2 (two) times daily.         Marland Kitchen zolpidem (AMBIEN) 10 MG tablet   Oral   Take 10 mg by mouth at bedtime as needed for sleep.           Allergies Bee venom; Darvon; Peanuts; Penicillins; Percocet; Sulfa antibiotics; Sulfacetamide sodium; Adhesive; Amikacin;  Aspirin; Doxycycline; Haloperidol; Hymenoptera venom preparations; Ibuprofen; Imipramine; Silicon; Ultram; Epinephrine; Hydroxyzine; Morphine and related; and Prednisone  Family History  Problem Relation Age of Onset  . Heart disease Father   . Cancer Mother     lung  . Urolithiasis Neg Hx   .  Kidney disease Neg Hx   . Kidney cancer Neg Hx   . Prostate cancer Neg Hx     Social History Social History  Substance Use Topics  . Smoking status: Former Research scientist (life sciences)  . Smokeless tobacco: Never Used  . Alcohol Use: No    Review of Systems  Constitutional: Positive for chills, no document of fevers. Eyes: Negative for visual changes. ENT: Negative for sore throat. Cardiovascular: Negative for chest pain. Respiratory: Negative for shortness of breath. Negative for cough Gastrointestinal: GI symptoms as per history of present illness Genitourinary: Negative for dysuria. Musculoskeletal: Negative for back pain. Skin: Negative for rash. Neurological: Negative for headache. 10 point Review of Systems otherwise negative ____________________________________________   PHYSICAL EXAM:  VITAL SIGNS: ED Triage Vitals  Enc Vitals Group     BP 04/20/15 1926 149/113 mmHg     Pulse Rate 04/20/15 1926 68     Resp 04/20/15 1926 14     Temp 04/20/15 1926 98.6 F (37 C)     Temp Source 04/20/15 1926 Oral     SpO2 04/20/15 1926 91 %     Weight --      Height --      Head Cir --      Peak Flow --      Pain Score 04/20/15 1919 10     Pain Loc --      Pain Edu? --      Excl. in [place]? --      Constitutional: Alert and cooperative, but very frustrated. No acute distress. HEENT   Head: Normocephalic and atraumatic.      Eyes: Conjunctivae are normal. PERRL. Normal extraocular movements.      Ears:         Nose: No congestion/rhinnorhea.   Mouth/Throat: Mucous membranes are moderately dry.   Neck: No stridor. Cardiovascular/Chest: Normal rate, regular rhythm.  No murmurs, rubs, or  gallops. Respiratory: Normal respiratory effort without tachypnea nor retractions. Breath sounds are clear and equal bilaterally. No wheezes/rales/rhonchi. Gastrointestinal: Soft. No distention, no guarding, no rebound. Nontender.   Genitourinary/rectal: Small noninflamed external hemorrhoids.  Patient declines/refuses digital rectal exam. Musculoskeletal: Nontender with normal range of motion in all extremities. No joint effusions.  No lower extremity tenderness.  No edema. Neurologic:  Normal speech and language. No gross or focal neurologic deficits are appreciated. Skin:  Skin is warm, dry and intact. No rash noted. Psychiatric: Mood and affect are normal. Speech and behavior are normal. Patient exhibits appropriate insight and judgment. She is quite frustrated.  ____________________________________________   EKG I, Lisa Roca, MD, the attending physician have personally viewed and interpreted all ECGs.  109 bpm. Sinus tachycardia. Narrow QRS. Normal axis. Nonspecific T-wave  EKG #2. 56 bpm. Sinus rhythm. Narrow QRS. No ST segment elevation, ST segment depression ____________________________________________  LABS (pertinent positives/negatives)  White blood count 11.8, hemoglobin 12.9 and platelet count 313 Troponin pending Comprehensive metabolic panel is pending, but the preliminary report per the tech is sodium 134, potassium 3.5, chloride 104, BUN 5 and creatinine 0.82 with glucose 80. Because these labs are all significantly changed from yesterday, they require a redraw before final result Lipase pending   ____________________________________________  RADIOLOGY All Xrays were viewed by me. Imaging interpreted by Radiologist.  CT head noncontrast: No acute findings. Mild small vessel disease  Chest one view portable:  No active disease __________________________________________  PROCEDURES  Procedure(s) performed: None  Critical Care performed: CRITICAL  CARE Performed by: Lisa Roca   Total  critical care time: 60 minutes  Critical care time was exclusive of separately billable procedures and treating other patients.  Critical care was necessary to treat or prevent imminent or life-threatening deterioration.  Critical care was time spent personally by me on the following activities: development of treatment plan with patient and/or surrogate as well as nursing, discussions with consultants, evaluation of patient's response to treatment, examination of patient, obtaining history from patient or surrogate, ordering and performing treatments and interventions, ordering and review of laboratory studies, ordering and review of radiographic studies, pulse oximetry and re-evaluation of patient's condition.   ____________________________________________   ED COURSE / ASSESSMENT AND PLAN  Pertinent labs & imaging results that were available during my care of the patient were reviewed by me and considered in my medical decision making (see chart for details).   This patient is back for the third ED visit within 1 week, for vomiting and diarrhea with bloody stools.  Since going home yesterday from the emergency department, she states that she is unable to keep by mouth food or liquids down. She states that when she took her pill antibiotics, she vomited them back up. She also immediately went to the bathroom and had watery and bloody diarrhea that was both maroon-colored as well as fresh or bright red blood bleeding.  She's not complaining of any new pain, but abdominal cramping consistent with previous.  She had left a message with on-call gastrologist, Dr. Gustavo Lah, and he did call down to the emergency department for me to let the patient know he had called back and left a message. Patient states that she was on her way to the emergency department.  I anticipate hospital admission for observation of persistent GI bleeding, as well as  intractable nausea and vomiting with persistent diarrhea and weakness.  Reviewing her labs from yesterday, her white blood cell count was elevated, and her sodium level was 126.  She is quite frustrated at having seen multiple doctors and still being ill. She allow me to look externally at her rectum for hemorrhoids, but declined permission to perform a digital rectal exam.     She had told me that she recently had an endoscopy and possibly colonoscopy, on review of her medical records in our system, it does appear that she did have endoscopy, but I see no record of her recent colonoscopy.   ----------------------------------------- 8:43 PM on 04/20/2015 -----------------------------------------  I was called to the bedside by the nurse because the patient complained of left arm numbness at the elbow down. Nurse also noted some left grip strength decrease. When I went in to see the patient, it appears that the time of onset was about 8:15 PM. As I was talking to her about the symptoms, her blood pressure cuff was reading severely high in the 180s over 160s, this did not seem likely to be real, and so I didn't repeat it. Several repeat blood pressures on the right upper extremity were elevated in the 175-180 range over 110-124. Next line next line patient at that point was able to state that she has a history of high blood pressure, and did not take her blood pressure medications because she's been vomiting.  Her left upper extremity was positive for paresthesias from the mid arm down to her hand. As I was getting ready to complete her strength exam on the left upper extremity she closed her eyes and started rocking her head side to side and did not seem to be able  to communicate. Over the next about 3-5 minutes she was able to answer some questions with shaking her head appropriately yes and no, but was aphasic. This was probably at around 8:30 PM  A code stroke was initiated, and I called CT scan  to have her taken to CT immediately. It may be that her symptoms are due to hypertensive emergency, and I ordered 20 mg of labetalol.  Alternatively, acute stroke may be a source of her neurologic left arm paresthesia, left hand weakness, and aphasia. Unfortunately, I don't think that she would be a candidate for TPA given the complaint of rectal bleeding.  During this episode did call the lab to find out what the results from the Y panel might be, and they are not back yet, except for the sodium. Yesterday sodium was 126 and was worried about severe hyponatremia, however the lab tech let me know that the sodium reported as 134. She was going to recheck this.  Just before patient went aphasic she says she was having some central chest pressure. Upon return from CT, a repeat EKG will be obtained and I will also order a portal chest x-ray.  ----------------------------------------- 8:51 PM on 04/20/2015 ----------------------------------------- At about 849, patient was wheeled back from CT scan, she was able to speak. Her blood pressure was systolic 123456. Patient tells me she's never had a stroke or TIA in the past. I was talking to her for about 3 minutes when she turned her head to the side and close her eyes again and was difficult to arouse. The nurse was able to ask her to please wake up and she did turn her head back to me with her eyes open and shook her head that she could not speak.  At this point I was in some way suspicious about possible underlying psychogenic issues, and she does have a history of bipolar, but again the patient herself stated that she hasn't had episodes like this before.  She did have another episode of aphasia that lasted a few minutes, followed by her ability to talk and then stating that she couldn't move both legs.  At this point I'm less concerned about a focal ischemic event. Specialist on-call neurology recommendations are pending.  In review of hemoglobin with  respect to her complaint of GI bleeding, her hemoglobin is 12.9, down from 13.7 yesterday. Patient will be observed and monitored with respect to her hemodynamic and hemoglobin stability.  I discussed this case with Dr. Jannifer Franklin, hospitalist for admission. He will be watching for the pending chemistry, troponin and repeat troponin, as well as specialist on-call neurology consult recommendations.    CONSULTATIONS: Tele-neurologist for code stroke. Hospitalist Dr. Jannifer Franklin for admission.   Patient / Family / Caregiver informed of clinical course, medical decision-making process, and agree with plan.    ___________________________________________   FINAL CLINICAL IMPRESSION(S) / ED DIAGNOSES   Final diagnoses:  Hematochezia  Melena  Colitis  Vomiting and diarrhea  Aphasia  Essential hypertension              Note: This dictation was prepared with Dragon dictation. Any transcriptional errors that result from this process are unintentional   Lisa Roca, MD 04/20/15 2133

## 2015-04-20 NOTE — ED Notes (Signed)
Pt states she has had "lousy care here" , she wants to go to [facility] but EMS would not take her. She states that she doesn't feel safe taking care of herself at home and was sent home last night while she was still having rectal bleeding. Pt states rectal bleeding has been going on for 3days. Pt c/o weakness and abd pain.

## 2015-04-20 NOTE — ED Notes (Addendum)
Pt c/o left arm numbness, MD notified. MD notified RN that pt was becoming unresponive, unable to verbalize. Pt HTN at 173/125, MD verbalizes order of 20mg  labetolol. Pt sent to CT scan at this time, code stroke called.

## 2015-04-20 NOTE — ED Notes (Signed)
Neuro on [facility] at this time

## 2015-04-20 NOTE — ED Notes (Signed)
Pt started to verbalizes after leaving CT scan, pt continues to go back and forth with RN communicating.

## 2015-04-20 NOTE — ED Notes (Signed)
Pt put under bedpan, pt states she has no urine to give. RN has also asked for stool sample and pt states she can't

## 2015-04-20 NOTE — ED Notes (Signed)
Pt from home via EMS for diarrhea. Pt was discharged here last night with v/d and colitis. Pt states she has still been having diarrhea and rectal bleeding. Pt A&O. VS stable

## 2015-04-21 ENCOUNTER — Inpatient Hospital Stay: Payer: Medicare Other

## 2015-04-21 LAB — BASIC METABOLIC PANEL
Anion gap: 5 (ref 5–15)
CHLORIDE: 111 mmol/L (ref 101–111)
CO2: 22 mmol/L (ref 22–32)
Calcium: 7.8 mg/dL — ABNORMAL LOW (ref 8.9–10.3)
Creatinine, Ser: 0.69 mg/dL (ref 0.44–1.00)
GFR calc Af Amer: >60 mL/min (ref >60)
GFR calc non Af Amer: >60 mL/min (ref >60)
GLUCOSE: 121 mg/dL — AB (ref 65–99)
POTASSIUM: 3.2 mmol/L — AB (ref 3.5–5.1)
Sodium: 138 mmol/L (ref 135–145)

## 2015-04-21 LAB — URINALYSIS COMPLETE WITH MICROSCOPIC (ARMC ONLY)
BILIRUBIN URINE: NEGATIVE
Glucose, UA: NEGATIVE mg/dL
NITRITE: NEGATIVE
PH: 6 (ref 5.0–8.0)
Protein, ur: NEGATIVE mg/dL
SPECIFIC GRAVITY, URINE: 1.009 (ref 1.005–1.030)

## 2015-04-21 LAB — CBC
HEMATOCRIT: 31.2 % — AB (ref 35.0–47.0)
Hemoglobin: 10.8 g/dL — ABNORMAL LOW (ref 12.0–16.0)
MCH: 31.7 pg (ref 26.0–34.0)
MCHC: 34.5 g/dL (ref 32.0–36.0)
MCV: 91.9 fL (ref 80.0–100.0)
Platelets: 267 10*3/uL (ref 150–440)
RBC: 3.4 MIL/uL — ABNORMAL LOW (ref 3.80–5.20)
RDW: 13.5 % (ref 11.5–14.5)
WBC: 8.9 10*3/uL (ref 3.6–11.0)

## 2015-04-21 LAB — TROPONIN I
Troponin I: <0.03 ng/mL (ref <0.031)
Troponin I: 0.03 ng/mL (ref <0.031)

## 2015-04-21 LAB — LIPID PANEL
Cholesterol: 123 mg/dL (ref 0–200)
HDL: 45 mg/dL (ref >40)
LDL CALC: 65 mg/dL (ref 0–99)
Total CHOL/HDL Ratio: 2.7 RATIO
Triglycerides: 65 mg/dL (ref <150)
VLDL: 13 mg/dL (ref 0–40)

## 2015-04-21 LAB — MAGNESIUM: MAGNESIUM: 1.8 mg/dL (ref 1.7–2.4)

## 2015-04-21 MED ORDER — SODIUM CHLORIDE 0.9 % IV SOLN: INTRAVENOUS | Status: DC

## 2015-04-21 MED ORDER — PAROXETINE HCL 20 MG PO TABS
30.0000 mg | ORAL_TABLET | Freq: Every day | ORAL | Status: DC
  Administered 2015-04-23 – 2015-04-26 (×4): 30 mg via ORAL
  Filled 2015-04-21 (×7): qty 2

## 2015-04-21 MED ORDER — ACETAMINOPHEN 325 MG PO TABS
650.0000 mg | ORAL_TABLET | Freq: Four times a day (QID) | ORAL | Status: DC | PRN
Start: 2015-04-21 — End: 2015-04-26
  Filled 2015-04-21: qty 2

## 2015-04-21 MED ORDER — LEVOTHYROXINE SODIUM 88 MCG PO TABS
88.0000 ug | ORAL_TABLET | Freq: Every day | ORAL | Status: DC
  Administered 2015-04-22: 88 ug via ORAL
  Filled 2015-04-21: qty 1

## 2015-04-21 MED ORDER — LORAZEPAM 0.5 MG PO TABS
0.5000 mg | ORAL_TABLET | Freq: Once | ORAL | Status: AC
  Administered 2015-04-21: 0.5 mg via ORAL
  Filled 2015-04-21: qty 1

## 2015-04-21 MED ORDER — STROKE: EARLY STAGES OF RECOVERY BOOK
Freq: Once | Status: AC
  Administered 2015-04-21: 02:00:00

## 2015-04-21 MED ORDER — ONDANSETRON HCL 4 MG PO TABS
4.0000 mg | ORAL_TABLET | Freq: Four times a day (QID) | ORAL | Status: DC | PRN
  Administered 2015-04-25: 22:00:00 4 mg via ORAL
  Filled 2015-04-21: qty 1

## 2015-04-21 MED ORDER — AMLODIPINE BESYLATE 5 MG PO TABS
2.5000 mg | ORAL_TABLET | Freq: Every day | ORAL | Status: DC
  Administered 2015-04-21 – 2015-04-26 (×6): 2.5 mg via ORAL
  Filled 2015-04-21 (×7): qty 1

## 2015-04-21 MED ORDER — SODIUM CHLORIDE 0.9 % IV SOLN
INTRAVENOUS | Status: AC
  Administered 2015-04-21: 02:00:00 via INTRAVENOUS

## 2015-04-21 MED ORDER — METRONIDAZOLE IN NACL 5-0.79 MG/ML-% IV SOLN
500.0000 mg | Freq: Three times a day (TID) | INTRAVENOUS | Status: DC
  Administered 2015-04-21 – 2015-04-26 (×15): 500 mg via INTRAVENOUS
  Filled 2015-04-21 (×20): qty 100

## 2015-04-21 MED ORDER — LORAZEPAM 2 MG/ML IJ SOLN
1.0000 mg | Freq: Once | INTRAMUSCULAR | Status: AC
  Administered 2015-04-21: 1 mg via INTRAVENOUS
  Filled 2015-04-21: qty 1

## 2015-04-21 MED ORDER — CIPROFLOXACIN IN D5W 400 MG/200ML IV SOLN
400.0000 mg | Freq: Two times a day (BID) | INTRAVENOUS | Status: DC
  Administered 2015-04-21 – 2015-04-26 (×11): 400 mg via INTRAVENOUS
  Filled 2015-04-21 (×15): qty 200

## 2015-04-21 MED ORDER — PANTOPRAZOLE SODIUM 40 MG IV SOLR
40.0000 mg | Freq: Two times a day (BID) | INTRAVENOUS | Status: DC
  Administered 2015-04-21 – 2015-04-23 (×6): 40 mg via INTRAVENOUS
  Filled 2015-04-21 (×6): qty 40

## 2015-04-21 MED ORDER — POTASSIUM CHLORIDE CRYS ER 20 MEQ PO TBCR
40.0000 meq | EXTENDED_RELEASE_TABLET | Freq: Once | ORAL | Status: AC
  Administered 2015-04-21: 40 meq via ORAL
  Filled 2015-04-21: qty 2

## 2015-04-21 MED ORDER — SODIUM CHLORIDE 0.9% FLUSH
3.0000 mL | Freq: Two times a day (BID) | INTRAVENOUS | Status: DC
  Administered 2015-04-21 – 2015-04-26 (×11): 3 mL via INTRAVENOUS

## 2015-04-21 MED ORDER — ONDANSETRON HCL 4 MG/2ML IJ SOLN
4.0000 mg | Freq: Four times a day (QID) | INTRAMUSCULAR | Status: DC | PRN
  Administered 2015-04-21 – 2015-04-24 (×8): 4 mg via INTRAVENOUS
  Filled 2015-04-21 (×8): qty 2

## 2015-04-21 MED ORDER — DIVALPROEX SODIUM 250 MG PO DR TAB
500.0000 mg | DELAYED_RELEASE_TABLET | Freq: Three times a day (TID) | ORAL | Status: DC
  Filled 2015-04-21 (×3): qty 2
  Filled 2015-04-21: qty 1

## 2015-04-21 MED ORDER — HYDROCODONE-ACETAMINOPHEN 5-325 MG PO TABS: 1.0000 | ORAL_TABLET | Freq: Four times a day (QID) | ORAL | Status: DC | PRN

## 2015-04-21 MED ORDER — ACETAMINOPHEN 650 MG RE SUPP: 650.0000 mg | Freq: Four times a day (QID) | RECTAL | Status: DC | PRN

## 2015-04-21 MED ORDER — POLYETHYLENE GLYCOL 3350 17 GM/SCOOP PO POWD
1.0000 | Freq: Once | ORAL | Status: AC
  Administered 2015-04-22: 06:00:00 255 g via ORAL
  Filled 2015-04-21: qty 255

## 2015-04-21 NOTE — Progress Notes (Signed)
   04/21/15 1000  Clinical Encounter Type  Visited With Patient not available  Visit Type Initial  Referral From Nurse  Consult/Referral To Chaplain  Spiritual Encounters  Spiritual Needs Literature  Attempted to provide AD education. Patient was out for procedure. Left material on the counter. Will leave order open for follow on. [facility]. [place]

## 2015-04-21 NOTE — Evaluation (Addendum)
Clinical/Bedside Swallow Evaluation Patient Details  Name: Angela [name] MRN: [id number] Date of Birth: Jun 09, 1950  Today's Date: 04/21/2015 Time: SLP Start Time (ACUTE ONLY): 45 SLP Stop Time (ACUTE ONLY): 1330 SLP Time Calculation (min) (ACUTE ONLY): 60 min  Past Medical History:  Past Medical History  Diagnosis Date  . Endometriosis   . Hypothyroidism   . GERD (gastroesophageal reflux disease)   . Bipolar 1 disorder ([place])   . Hypertension   . Hernia, hiatal   . Allergic rhinitis   . Bronchitis   . Endometriosis   . Otalgia   . Otitis media   . Chronic sinusitis   . skin cancer   . Malignant neoplasm of skin   . Facial pain   . Dizziness   . Blurred vision   . Neuropathy ([place])   . Thyroid disease   . Anemia   . Depression   . Migraine   . Chronic constipation   . Diverticulitis   . Diverticulosis   . GERD (gastroesophageal reflux disease)    Past Surgical History:  Past Surgical History  Procedure Laterality Date  . Tonsillectomy    . Uteral suspension    . Appendectomy    . Hernia repair    . Laproscopy    . Cholecystectomy, laparoscopic    . Cholecystectomy    . Esophagogastroduodenoscopy (egd) with propofol N/A 03/29/2015    Procedure: ESOPHAGOGASTRODUODENOSCOPY (EGD) WITH PROPOFOL;  Surgeon: Josefine Class, MD;  Location: [facility] ENDOSCOPY;  Service: Endoscopy;  Laterality: N/A;   HPI:  Pt is a 65 y.o. female w/ lengthy PMH including GERD, hiatal hernia, diverticulosis, Bipolar Dis., migraines, HTN and other dxs who presents with Abdominal pain and melena. Patient has been here to the ED several times in the last few days. She presented initially with abdominal pain and diarrhea. She states that sometime after that she began having melena. She was found on CT scan to have a colitis, and was sent home with by mouth antibiotics. She returns today with no improvement in her symptoms. While in the ED she had an acute elevation of her blood pressure, followed  by onset of neurological symptoms including left arm sensory deficit and transient aphasia. She also had an episode of chest discomfort while in the ED. She states that she has also been having melena for the past couple of days. Pt is A/O x3; follows instructions and frequently requested items/services from NSG and SLP re: her foods/liquids. No receptive or expressive language deficits/aphasia noted in pt's conversation. Of note, pt c/o significant Esophageal dysmotility stating she had a "large Hiatal Hernia" and reflux for which she takes a PPI. Unsure if pt's baseline Cognitive status.  Assessment / Plan / Recommendation Clinical Impression  Pt appears at her baseline for swallowing; presented w/ a functional oropharyngeal phase swallow function tolerating po's w/ no overt s/s of aspiration noted w/ the few po trials accepted. Pt did not accept many trials sec. to concerns of feelings of n/v occuring. Oral phase wfl w/ bolus management. Pt described extensive Esophageal dysmotility and discomfort; GI is consulted to f/u w/ pt during admission. Pt appears at reduced risk for prandial aspiration (from an oropharyngeal standpoint); pt is at increased risk for aspiration from a GI standpoint - from any reflux or regurgitation. Rec. initiation of GI diet as per MD w/ general aspiration precautions and using a puree to aid swallowing pills, or liquid/powder forms of pills for easier Esophageal clearing. Pt agreed; NSG updated.  Aspiration Risk   (reduced from oropharyngeal reasons; mild d/t GI issues)    Diet Recommendation  clear liquid diet per MD to upgrade to a regular diet when GI appropriate; general aspiration precautions; Reflux precautions (baseline)  Medication Administration: Whole meds with puree (or in liquid/powder forms)    Other  Recommendations Recommended Consults: Consider GI evaluation (dietary consult re: needs) Oral Care Recommendations: Oral care BID;Patient independent with  oral care   Follow up Recommendations  None    Frequency and Duration            Prognosis Prognosis for Safe Diet Advancement: Good Barriers to Reach Goals: Severity of deficits      Swallow Study   General Date of Onset: 04/20/15 HPI: Pt is a 65 y.o. female w/ lengthy PMH including GERD, hiatal hernia, diverticulosis, Bipolar Dis., migraines, HTN and other dxs who presents with Abdominal pain and melena. Patient has been here to the ED several times in the last few days. She presented initially with abdominal pain and diarrhea. She states that sometime after that she began having melena. She was found on CT scan to have a colitis, and was sent home with by mouth antibiotics. She returns today with no improvement in her symptoms. While in the ED she had an acute elevation of her blood pressure, followed by onset of neurological symptoms including left arm sensory deficit and transient aphasia. She also had an episode of chest discomfort while in the ED. She states that she has also been having melena for the past couple of days. Pt is A/O x3; follows instructions and frequently requested items/services from NSG and SLP re: her foods/liquids. No receptive or expressive language deficits/aphasia noted in pt's conversation. Of note, pt c/o significant Esophageal dysmotility stating she had a "large Hiatal Hernia" and reflux for which she takes a PPI. Type of Study: Bedside Swallow Evaluation Previous Swallow Assessment: none Diet Prior to this Study: Regular;Thin liquids (stated she is not suppose to eat wheat?) Temperature Spikes Noted: No (wbc 8.9) Respiratory Status: Room air History of Recent Intubation: No Behavior/Cognition: Alert;Cooperative Oral Cavity Assessment: Dry Oral Care Completed by SLP: No Oral Cavity - Dentition: Adequate natural dentition Vision: Functional for self-feeding Self-Feeding Abilities: Able to feed self Patient Positioning: Upright in bed Baseline Vocal  Quality: Normal Volitional Cough: Strong Volitional Swallow: Able to elicit    Oral/Motor/Sensory Function Overall Oral Motor/Sensory Function: Within functional limits   Ice Chips Ice chips: Within functional limits Presentation: Spoon (fed; 2 trials)   Thin Liquid Thin Liquid: Within functional limits Presentation: Self Fed;Straw (2 trials) Other Comments: pt did not want anymore sec. to feelings of n/v    Nectar Thick Nectar Thick Liquid: Not tested   Honey Thick Honey Thick Liquid: Not tested   Puree Puree: Within functional limits Presentation: Self Fed;Spoon (2 trials) Other Comments: did not want anymore sec. to feelings of n/v   Solid   GO   Solid: Not tested Other Comments: pt declined       Orinda Kenner, MS, CCC-SLP  Mishelle Hassan 04/21/2015,2:26 PM

## 2015-04-21 NOTE — Consult Note (Signed)
See Claudie Leach note.  Pt difficult to converse with, she does have some senile changes of brain on scan.  She has wandering of speech and is difficult to keep her on a subject more than 1 sentence.  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  Patient story and CT most likely suggest ischemic colitis with inflammation in watershed area of colon.  Will give her 2 liters of Miralax prep tomorrow morning and attempt a colonoscopy tomorrow afternoon.

## 2015-04-21 NOTE — Progress Notes (Signed)
[facility] NAME: Angela [name]    MR#:  [id number]  DATE OF BIRTH:  03-27-1950  SUBJECTIVE:   Patient presented with crampy abdominal pain and melena. Her blood pressure was elevated and then subsequently had left arm sensory deficit and transient aphasia.  REVIEW OF SYSTEMS:    Review of Systems  Constitutional: Negative for fever, chills and malaise/fatigue.  HENT: Negative for ear discharge, ear pain, hearing loss, nosebleeds and sore throat.   Eyes: Negative for blurred vision and pain.  Respiratory: Negative for cough, hemoptysis, shortness of breath and wheezing.   Cardiovascular: Negative for chest pain, palpitations and leg swelling.  Gastrointestinal: Positive for abdominal pain, diarrhea, blood in stool and melena. Negative for nausea and vomiting.  Genitourinary: Negative for dysuria.  Musculoskeletal: Negative for back pain.  Neurological: Negative for dizziness, tremors, speech change, focal weakness, seizures and headaches.  Endo/Heme/Allergies: Does not bruise/bleed easily.  Psychiatric/Behavioral: Negative for depression, suicidal ideas and hallucinations.    Tolerating Diet: NPO    DRUG ALLERGIES:   Allergies  Allergen Reactions  . Bee Venom Hives  . Darvon [Propoxyphene] Anaphylaxis and Hives  . Peanuts [Peanut Oil] Anaphylaxis and Hives  . Penicillins Hives and Other (See Comments)    Has patient had a PCN reaction causing immediate rash, facial/tongue/throat swelling, SOB or lightheadedness with hypotension: Unsure  Has patient had a PCN reaction causing severe rash involving mucus membranes or skin necrosis: Unsure  Has patient had a PCN reaction that required hospitalization Unsure  Has patient had a PCN reaction occurring within the last 10 years: Unsure  If all of the above answers are "NO", then may proceed with Cephalosporin use.  Marland Kitchen Percocet [Oxycodone-Acetaminophen] Nausea And  Vomiting and Anxiety    Reaction:  Hallucinations   . Sulfa Antibiotics Anaphylaxis, Hives and Itching  . Sulfacetamide Sodium Anaphylaxis, Hives and Itching  . Adhesive [Tape] Hives  . Amikacin Nausea And Vomiting  . Aspirin Hives  . Doxycycline Photosensitivity  . Haloperidol Nausea And Vomiting  . Hymenoptera Venom Preparations Hives  . Ibuprofen Hives and Nausea And Vomiting  . Imipramine Hives  . Silicon Hives  . Ultram [Tramadol] Hives  . Epinephrine Palpitations  . Hydroxyzine Anxiety  . Morphine And Related Nausea And Vomiting, Anxiety and Other (See Comments)    Reaction:  Hallucinations   . Prednisone Nausea And Vomiting, Anxiety and Other (See Comments)    Reaction:  Strange thoughts     VITALS:  Blood pressure 128/62, pulse 52, temperature 97.8 F (36.6 C), temperature source Oral, resp. rate 18, height 5' (1.524 m), weight 55.021 kg (121 lb 4.8 oz), SpO2 95 %.  PHYSICAL EXAMINATION:   Physical Exam  Constitutional: She is oriented to person, place, and time and well-developed, well-nourished, and in no distress. No distress.  HENT:  Head: Normocephalic.  Eyes: No scleral icterus.  Neck: Normal range of motion. Neck supple. No JVD present. No tracheal deviation present.  Cardiovascular: Normal rate, regular rhythm and normal heart sounds.  Exam reveals no gallop and no friction rub.   No murmur heard. Pulmonary/Chest: Effort normal and breath sounds normal. No respiratory distress. She has no wheezes. She has no rales. She exhibits no tenderness.  Abdominal: Soft. Bowel sounds are normal. She exhibits no distension and no mass. There is no tenderness. There is no rebound and no guarding.  Musculoskeletal: Normal range of motion. She exhibits no edema.  Neurological: She is alert  and oriented to person, place, and time.  Skin: Skin is warm. No rash noted. No erythema.  Psychiatric: Affect and judgment normal.      LABORATORY PANEL:   CBC  Recent Labs Lab  04/21/15 0422  WBC 8.9  HGB 10.8*  HCT 31.2*  PLT 267   ------------------------------------------------------------------------------------------------------------------  Chemistries   Recent Labs Lab 04/20/15 1952 04/21/15 0422  NA 134* 138  K 3.5 3.2*  CL 104 111  CO2 24 22  GLUCOSE 80 121*  BUN 5* <5*  CREATININE 0.82 0.69  CALCIUM 8.9 7.8*  AST 31  --   ALT 25  --   ALKPHOS 74  --   BILITOT 0.3  --    ------------------------------------------------------------------------------------------------------------------  Cardiac Enzymes  Recent Labs Lab 04/20/15 1952 04/21/15 0422  TROPONINI <0.03 <0.03   ------------------------------------------------------------------------------------------------------------------  RADIOLOGY:  Ct Head Wo Contrast  04/20/2015  CLINICAL DATA:  Weakness, left arm numbness and a aphasia. EXAM: CT HEAD WITHOUT CONTRAST TECHNIQUE: Contiguous axial images were obtained from the base of the skull through the vertex without intravenous contrast. COMPARISON:  03/04/2005 FINDINGS: Mild small vessel disease present. The brain demonstrates no evidence of hemorrhage, infarction, edema, mass effect, extra-axial fluid collection, hydrocephalus or mass lesion. The skull is unremarkable. IMPRESSION: No acute findings.  Mild small vessel disease. Electronically Signed   By: Aletta Edouard M.D.   On: 04/20/2015 20:52   Mr Brain Wo Contrast  04/21/2015  CLINICAL DATA:  TIA with left arm sensory deficit and transient aphasia. EXAM: MRI HEAD WITHOUT CONTRAST MRA HEAD WITHOUT CONTRAST TECHNIQUE: Multiplanar, multiecho pulse sequences of the brain and surrounding structures were obtained without intravenous contrast. Angiographic images of the head were obtained using MRA technique without contrast. COMPARISON:  12/02/2013 neck MRI. FINDINGS: MRI HEAD FINDINGS Calvarium and upper cervical spine: No focal marrow signal abnormality. Orbits: Negative. Sinuses and  Mastoids: Clear. Brain: No acute or remote infarct, hemorrhage, hydrocephalus, or mass lesion. No evidence of large vessel occlusion. Generalized atrophy, mild to moderate. Mild/age congruent small-vessel ischemic change in the cerebral white matter. MRA HEAD FINDINGS Motion degraded. Symmetric carotid and vertebral branching and size. Smooth and patent vertebral, basilar, and proximal posterior cerebral arteries as permitted by motion artifact. Symmetric carotid arteries. Carotid siphons are smooth and patent accounting for motion. Intact circle-of-Willis with small communicating arteries. No notable atherosclerotic irregularity, beading, major vessel occlusion, or flow limiting stenosis. Negative for aneurysm (left ACA branch point prominence shows no aneurysm on sagittal reformats-representative images sent to PACS. IMPRESSION: 1. No acute finding, including infarct.  Negative intracranial MRA. 2. Generalized atrophy. Electronically Signed   By: Monte Fantasia M.D.   On: 04/21/2015 12:14   Ct Abdomen Pelvis W Contrast  04/19/2015  CLINICAL DATA:  Abdominal pain with nausea, vomiting, and diarrhea EXAM: CT ABDOMEN AND PELVIS WITH CONTRAST TECHNIQUE: Multidetector CT imaging of the abdomen and pelvis was performed using the standard protocol following bolus administration of intravenous contrast. Oral contrast was also administered. CONTRAST:  19mL ISOVUE-300 IOPAMIDOL (ISOVUE-300) INJECTION 61% COMPARISON:  April 17, 2015 and November 25, 2014 ; August 04, 2013 FINDINGS: Lower chest: Lung bases are clear. There is a sizable paraesophageal hernia, incompletely visualized. Hepatobiliary: Liver is prominent measuring 20.5 cm in length. There is a Riedel's lobe, an anatomic variant. No focal liver lesions are evident. The gallbladder is absent. There is diffuse with intrahepatic and extrahepatic biliary duct dilatation. The common bile duct measures 15 mm in diameter. No mass or calculus is seen  by CT on this study.  Pancreas: There is no pancreatic mass or inflammatory focus. Pancreatic duct is upper normal in size but stable. Spleen: No splenic lesions are evident. Adrenals/Urinary Tract: Adrenals appear normal bilaterally. Kidneys bilaterally show no mass or hydronephrosis on either side. There is a large extrarenal pelvis on each side, an anatomic variant. There is no renal or ureteral calculus on either side. Urinary bladder is distended with wall thickness within normal limits. Stomach/Bowel: There is wall thickening from the distal transverse colon throughout the descending colon to the proximal sigmoid colon with mesenteric thickening but no appreciable diverticular inflammation. There is mild wall enhancement in portions of this area of colonic inflammation. No abscess or perforation is evident. No other bowel wall thickening is apparent. No bowel obstruction. No free air or portal venous air is evident. There are scattered distal sigmoid diverticula without diverticulitis. Vascular/Lymphatic: There is atherosclerotic calcification in the aorta without apparent aneurysm. The major mesenteric vessels appear patent. There is no appreciable adenopathy in the abdomen or pelvis. Reproductive: Uterus is anteverted. No pelvic mass or pelvic fluid collection. Other: Evidence of previous abdominal hernia repair with mesh in place anteriorly. Appendix absent. No ascites or abscess in the abdomen or pelvis. Musculoskeletal: There are no blastic or lytic bone lesions. No intramuscular or abdominal wall lesion. IMPRESSION: There is evidence of colitis extending from the distal transverse colon to the proximal sigmoid colon, characterized bowel wall thickening, wall enhancement, and mesenteric thickening. No diverticular seen in this area to indicate that this colitis is due to diverticulitis. No perforation or abscess seen. Sizable paraesophageal hernia, again noted. Prominent liver. Gallbladder absent. Biliary duct dilatation is  noted without mass or calculus seen by CT in the biliary ductal system. No renal or ureteral calculus. No hydronephrosis. Urinary bladder distended without wall thickening. Smash in anterior abdomen consistent with prior hernia repair. Appendix absent. Cystic lesion right adnexa. Stability of this lesion over time is suggestive of benign etiology. Electronically Signed   By: Lowella Grip III M.D.   On: 04/19/2015 17:33   US Carotid Bilateral  04/21/2015  CLINICAL DATA:  Hypertension, TIA symptoms, syncope, visual disturbance EXAM: BILATERAL CAROTID DUPLEX ULTRASOUND TECHNIQUE: Pearline Cables scale imaging, color Doppler and duplex ultrasound were performed of bilateral carotid and vertebral arteries in the neck. COMPARISON:  None. FINDINGS: Criteria: Quantification of carotid stenosis is based on velocity parameters that correlate the residual internal carotid diameter with NASCET-based stenosis levels, using the diameter of the distal internal carotid lumen as the denominator for stenosis measurement. The following velocity measurements were obtained: RIGHT ICA:  111/36 cm/sec CCA:  123456 cm/sec SYSTOLIC ICA/CCA RATIO:  1.5 DIASTOLIC ICA/CCA RATIO:  2.2 ECA:  75 cm/sec LEFT ICA:  159/55 cm/sec CCA:  [id number] cm/sec SYSTOLIC ICA/CCA RATIO:  1.7 DIASTOLIC ICA/CCA RATIO:  2.5 ECA:  75 cm/sec RIGHT CAROTID ARTERY: Minor carotid intimal thickening without significant plaque formation. No hemodynamically significant right ICA stenosis, velocity elevation, or turbulent flow. Degree of narrowing less than 50%. RIGHT VERTEBRAL ARTERY:  Antegrade LEFT CAROTID ARTERY: Similar scattered minor intimal thickening without significant plaque formation. No hemodynamically significant left ICA stenosis, velocity elevation, or turbulent flow. LEFT VERTEBRAL ARTERY:  Antegrade IMPRESSION: Minor carotid intimal thickening. No significant atherosclerosis. No hemodynamically significant ICA stenosis. Patent antegrade vertebral flow  bilaterally. Electronically Signed   By: Jerilynn Mages.  Shick M.D.   On: 04/21/2015 11:14   Dg Chest Port 1 View  04/20/2015  CLINICAL DATA:  Diarrhea and rectal bleeding, recently  discharged from the hospital for same. EXAM: PORTABLE CHEST 1 VIEW COMPARISON:  06/03/2014 FINDINGS: A single AP portable view of the chest demonstrates no focal airspace consolidation or alveolar edema. The lungs are grossly clear. There is no large effusion or pneumothorax. Cardiac and mediastinal contours appear unremarkable. IMPRESSION: No active disease. Electronically Signed   By: Andreas Newport M.D.   On: 04/20/2015 21:05   Mr Lovenia Kim  04/21/2015  CLINICAL DATA:  TIA with left arm sensory deficit and transient aphasia. EXAM: MRI HEAD WITHOUT CONTRAST MRA HEAD WITHOUT CONTRAST TECHNIQUE: Multiplanar, multiecho pulse sequences of the brain and surrounding structures were obtained without intravenous contrast. Angiographic images of the head were obtained using MRA technique without contrast. COMPARISON:  12/02/2013 neck MRI. FINDINGS: MRI HEAD FINDINGS Calvarium and upper cervical spine: No focal marrow signal abnormality. Orbits: Negative. Sinuses and Mastoids: Clear. Brain: No acute or remote infarct, hemorrhage, hydrocephalus, or mass lesion. No evidence of large vessel occlusion. Generalized atrophy, mild to moderate. Mild/age congruent small-vessel ischemic change in the cerebral white matter. MRA HEAD FINDINGS Motion degraded. Symmetric carotid and vertebral branching and size. Smooth and patent vertebral, basilar, and proximal posterior cerebral arteries as permitted by motion artifact. Symmetric carotid arteries. Carotid siphons are smooth and patent accounting for motion. Intact circle-of-Willis with small communicating arteries. No notable atherosclerotic irregularity, beading, major vessel occlusion, or flow limiting stenosis. Negative for aneurysm (left ACA branch point prominence shows no aneurysm on sagittal  reformats-representative images sent to PACS. IMPRESSION: 1. No acute finding, including infarct.  Negative intracranial MRA. 2. Generalized atrophy. Electronically Signed   By: Monte Fantasia M.D.   On: 04/21/2015 12:14     ASSESSMENT AND PLAN:   64 year old female with essential hypertension who presents with acute colitis.  1. Acute colitis seen on CT scan on April 3, failed outpatient treatment: Continue Flagyl and ciprofloxacin. Obtain GI panel. GI consult pending Consider surgical evaluation for possible ischemic colitis. Will d/w Dr Tiffany Kocher.  2. Melena: Hemoglobin remained stable. Continue PPI and GI consult.  3. Essential hypertension: Continue Norvasc   4. Hypothyroid: Continue Synthroid.    5. Bipolar: Continue Depakote and Paxil.   6. Hypokalemia: This is repleted. Recheck in a.m.  7. Numbness with transient aphasia: MRI is negative for stroke. Carotid ultrasound shows no hemodynamically significant stenosis. Follow up on echo and neurology consult. This is to [facility] TIA more related to HTN at the time   Management plans discussed with the patient and she is in agreement.  CODE STATUS: FULL  TOTAL TIME TAKING CARE OF THIS PATIENT: 31 minutes.     POSSIBLE D/C 2-3 days, DEPENDING ON CLINICAL CONDITION.   Chosen Garron M.D on 04/21/2015 at 12:51 PM  Between 7am to 6pm - Pager - [phone] After 6pm go to [email or web address] - password [email or web address] [facility]  Office  [phone]  CC: Primary care physician; [place] Pink, MD  Note: This dictation was prepared with Dragon dictation along with smaller phrase technology. Any transcriptional errors that result from this process are unintentional.

## 2015-04-21 NOTE — Consult Note (Signed)
GI Inpatient Consult Note  Reason for Consult: Melena   Attending Requesting Consult: Dr. Jannifer Franklin  History of Present Illness: Angela [name] is a 65 y.o. female reports that she started having abdominal pain Friday (3/31), describes it as crampy.  She visited the ED on 3/31 and was noted to have lower abdominal pain on palpation.  A CT ab/pel was completed without any signs of colitis / diverticulitis.  Patient was treated with keflex for possible cellulitis secondary to labial biopsy.  She reports she was told she was constipated and was given lactulose.  She took 10 grams on Sunday, passed a hard stool and then had multiple episodes of diarrhea over the next 24 hours.  She reports seeing maroon blood and BRB along with the stool.  She reports her abdominal pain continued along with nausea, but denies vomiting.  She presented to ED again on 4/3 with these complaints, She had a repeat CT ab/ pel that showed colitis, please see full report below.  She was given Cipro and Flagyl and told to f/u with PCP.  She presented again to ED on 4/4 with same complaints but also reported she was unable to keep anything down. She had an acute elevation of BP while in ED yesterday, CT and MRI are negative for stroke.  Reports her last bowel movement was last night.  Her last colonoscopy was 10/17/2012 for generalized abdominal pain and constipation with Dr. Candace Cruise.  Findings: Large and small tics in sigmoid colon, non-thrombosed external hemorrhoids, otherwise normal exam.  Her last EGD was 03/29/15 for heartburn, generalized abdominal pain, dysphagia, f/u of hiatal hernia with Dr. Rayann Heman.  Findings: 6 mm hiatal hernia, tortuous esophagus, three gastric polyps, normal examined duodenum, widely patent pylorus.  Past Medical History:  Past Medical History  Diagnosis Date  . Endometriosis   . Hypothyroidism   . GERD (gastroesophageal reflux disease)   . Bipolar 1 disorder ([place])   . Hypertension   . Hernia,  hiatal   . Allergic rhinitis   . Bronchitis   . Endometriosis   . Otalgia   . Otitis media   . Chronic sinusitis   . skin cancer   . Malignant neoplasm of skin   . Facial pain   . Dizziness   . Blurred vision   . Neuropathy ([place])   . Thyroid disease   . Anemia   . Depression   . Migraine   . Chronic constipation   . Diverticulitis   . Diverticulosis   . GERD (gastroesophageal reflux disease)     Problem List: Patient Active Problem List   Diagnosis Date Noted  . Colitis, acute 04/20/2015  . Melena 04/20/2015  . Accelerated hypertension 04/20/2015  . TIA (transient ischemic attack) 04/20/2015  . Hypothyroidism 04/20/2015  . GERD (gastroesophageal reflux disease) 04/20/2015  . Chest pain 04/20/2015  . Acute colitis 04/20/2015  . Pelvic pain in female 10/30/2014    Past Surgical History: Past Surgical History  Procedure Laterality Date  . Tonsillectomy    . Uteral suspension    . Appendectomy    . Hernia repair    . Laproscopy    . Cholecystectomy, laparoscopic    . Cholecystectomy    . Esophagogastroduodenoscopy (egd) with propofol N/A 03/29/2015    Procedure: ESOPHAGOGASTRODUODENOSCOPY (EGD) WITH PROPOFOL;  Surgeon: Josefine Class, MD;  Location: [facility] ENDOSCOPY;  Service: Endoscopy;  Laterality: N/A;    Allergies: Allergies  Allergen Reactions  . Bee Venom Hives  . Darvon [Propoxyphene]  Anaphylaxis and Hives  . Peanuts [Peanut Oil] Anaphylaxis and Hives  . Penicillins Hives and Other (See Comments)    Has patient had a PCN reaction causing immediate rash, facial/tongue/throat swelling, SOB or lightheadedness with hypotension: Unsure  Has patient had a PCN reaction causing severe rash involving mucus membranes or skin necrosis: Unsure  Has patient had a PCN reaction that required hospitalization Unsure  Has patient had a PCN reaction occurring within the last 10 years: Unsure  If all of the above answers are "NO", then may proceed with Cephalosporin use.   Marland Kitchen Percocet [Oxycodone-Acetaminophen] Nausea And Vomiting and Anxiety    Reaction:  Hallucinations   . Sulfa Antibiotics Anaphylaxis, Hives and Itching  . Sulfacetamide Sodium Anaphylaxis, Hives and Itching  . Adhesive [Tape] Hives  . Amikacin Nausea And Vomiting  . Aspirin Hives  . Doxycycline Photosensitivity  . Haloperidol Nausea And Vomiting  . Hymenoptera Venom Preparations Hives  . Ibuprofen Hives and Nausea And Vomiting  . Imipramine Hives  . Silicon Hives  . Ultram [Tramadol] Hives  . Epinephrine Palpitations  . Hydroxyzine Anxiety  . Morphine And Related Nausea And Vomiting, Anxiety and Other (See Comments)    Reaction:  Hallucinations   . Prednisone Nausea And Vomiting, Anxiety and Other (See Comments)    Reaction:  Strange thoughts     Home Medications: Prescriptions prior to admission  Medication Sig Dispense Refill Last Dose  . acetaminophen (RA ACETAMINOPHEN) 650 MG CR tablet Take by mouth.   Taking  . amLODipine (NORVASC) 2.5 MG tablet Take 2.5 mg by mouth daily.   Taking  . Biotin 5000 MCG CAPS Take 1 capsule by mouth daily.    Taking  . calcium carbonate (OS-CAL) 600 MG TABS tablet Take 600 mg by mouth 3 (three) times daily with meals.   Taking  . calcium carbonate (OSCAL) 1500 (600 Ca) MG TABS tablet Take by mouth.   Taking  . cephALEXin (KEFLEX) 500 MG capsule Take 1 capsule (500 mg total) by mouth 4 (four) times daily. 40 capsule 0   . cetirizine (ZYRTEC) 10 MG tablet Take 10 mg by mouth daily.   Taking  . Cholecalciferol (VITAMIN D-1000 MAX ST) 1000 units tablet Take by mouth.   Taking  . ciprofloxacin (CIPRO) 500 MG tablet Take 1 tablet (500 mg total) by mouth 2 (two) times daily. 14 tablet 0   . conjugated estrogens (PREMARIN) vaginal cream Place 1 Applicatorful vaginally daily.   Taking  . cycloSPORINE (RESTASIS) 0.05 % ophthalmic emulsion Apply to eye.   Taking  . divalproex (DEPAKOTE) 500 MG DR tablet Take 500 mg by mouth 3 (three) times daily.   Taking   . EPINEPHrine (EPIPEN 2-PAK) 0.3 mg/0.3 mL IJ SOAJ injection Inject 0.3 mg into the skin.   Taking  . erythromycin ophthalmic ointment 1 application at bedtime.   Taking  . estradiol (ESTRING) 2 MG vaginal ring Place 2 mg vaginally every 3 (three) months. follow package directions (Patient not taking: Reported on 04/20/2015) 1 each 12   . fluconazole (DIFLUCAN) 150 MG tablet Take 1 tablet (150 mg total) by mouth once. Can take additional dose three days later if symptoms persist 1 tablet 3   . HYDROcodone-acetaminophen (NORCO/VICODIN) 5-325 MG per tablet Take 1 tablet by mouth every 6 (six) hours as needed for moderate pain. 5 tablet 0 Taking  . hydrocortisone (ANUSOL-HC) 2.5 % rectal cream Place 1 application rectally 2 (two) times daily.   Taking  . Hypromellose (GENTEAL MILD)  0.2 % SOLN Apply to eye.   Taking  . ipratropium (ATROVENT) 0.03 % nasal spray Place 2 sprays into both nostrils every 12 (twelve) hours.   Taking  . LACTOBACILLUS EXTRA STRENGTH CAPS Take by mouth.   Taking  . LACTOBACILLUS PO Take by mouth.   Taking  . lactulose (CEPHULAC) 10 G packet Take 1 packet (10 g total) by mouth daily as needed (constipation). 10 each 0 Taking  . levothyroxine (SYNTHROID, LEVOTHROID) 88 MCG tablet Take 88 mcg by mouth daily before breakfast.   Taking  . lidocaine (XYLOCAINE) 2 % solution    Taking  . Lifitegrast (XIIDRA) 5 % SOLN Apply to eye.   Taking  . Linaclotide (LINZESS) 290 MCG CAPS capsule Take 290 mcg by mouth daily.   Taking  . magnesium hydroxide (MILK OF MAGNESIA) 400 MG/5ML suspension Take by mouth daily as needed for mild constipation.   Taking  . magnesium sulfate (EPSOM SALTS) crystals Use 1/2 cup X one quart warm water daily. 1816 g 3 Taking  . Menthol-Methyl Salicylate (ICY HOT EXTRA STRENGTH) 10-30 % CREA Apply topically.   Taking  . metroNIDAZOLE (FLAGYL) 500 MG tablet Take 1 tablet (500 mg total) by mouth 3 (three) times daily. 21 tablet 0   . multivitamin-iron-minerals-folic  acid (THERAPEUTIC-M) TABS tablet Take 1 tablet by mouth daily.   Taking  . Omega-3 Krill Oil 1000 MG CAPS Take by mouth.   Taking  . ondansetron (ZOFRAN-ODT) 8 MG disintegrating tablet Take 8 mg by mouth every 8 (eight) hours as needed for nausea or vomiting.   Taking  . pantoprazole (PROTONIX) 40 MG tablet Take 40 mg by mouth daily.   Taking  . PARoxetine (PAXIL) 30 MG tablet Take 30 mg by mouth daily.   Taking  . promethazine (PHENERGAN) 12.5 MG tablet Take 1 tablet (12.5 mg total) by mouth every 6 (six) hours as needed for nausea or vomiting. 15 tablet 0 Taking  . promethazine (PHENERGAN) 25 MG tablet Take 1 tablet (25 mg total) by mouth every 6 (six) hours as needed for nausea or vomiting. 10 tablet 0 Taking  . Spirulina 500 MG TABS Take by mouth.   Taking  . terbinafine (LAMISIL) 1 % cream Apply 1 application topically 2 (two) times daily.   Taking  . terconazole (TERAZOL 7) 0.4 % vaginal cream Place 1 applicator vaginally at bedtime.   Taking  . tiZANidine (ZANAFLEX) 4 MG tablet Take 4 mg by mouth every 6 (six) hours as needed for muscle spasms.   Taking  . triamcinolone cream (KENALOG) 0.1 % Apply 1 application topically 2 (two) times daily.   Taking  . zolpidem (AMBIEN) 10 MG tablet Take 10 mg by mouth at bedtime as needed for sleep.   Taking   Home medication reconciliation was completed with the patient.   Scheduled Inpatient Medications:   . amLODipine  2.5 mg Oral Daily  . ciprofloxacin  400 mg Intravenous Q12H  . divalproex  500 mg Oral TID  . levothyroxine  88 mcg Oral QAC breakfast  . metronidazole  500 mg Intravenous Q8H  . pantoprazole (PROTONIX) IV  40 mg Intravenous Q12H  . PARoxetine  30 mg Oral Daily  . potassium chloride  40 mEq Oral Once  . sodium chloride flush  3 mL Intravenous Q12H    Continuous Inpatient Infusions:   . sodium chloride 75 mL/hr at 04/21/15 0204    PRN Inpatient Medications:  acetaminophen **OR** acetaminophen, HYDROcodone-acetaminophen,  ondansetron **OR** ondansetron (ZOFRAN)  IV  Family History: family history includes Cancer in her mother; Heart disease in her father. There is no history of Urolithiasis, Kidney disease, Kidney cancer, or Prostate cancer.   Social History:   reports that she has quit smoking. She has never used smokeless tobacco. She reports that she does not drink alcohol or use illicit drugs.   Review of Systems: Constitutional: Weight is stable.  Eyes: No changes in vision. ENT: No oral lesions, sore throat.  GI: see HPI.  Heme/Lymph: No easy bruising.  CV: No chest pain.  GU: No hematuria.  Integumentary: No rashes.  Neuro: No headaches.  Psych: No depression/anxiety.  Endocrine: No heat/cold intolerance.  Allergic/Immunologic: No urticaria.  Resp: No cough, SOB.  Musculoskeletal: No joint swelling.    Physical Examination: BP 101/53 mmHg  Pulse 58  Temp(Src) 97.8 F (36.6 C) (Oral)  Resp 18  Ht 5' (1.524 m)  Wt 55.021 kg (121 lb 4.8 oz)  BMI 23.69 kg/m2  SpO2 95% Gen: NAD, alert and oriented x 4, scattered historian, pajama bottoms had BRB HEENT: PEERLA, EOMI, Neck: supple, no JVD or thyromegaly Chest: CTA bilaterally, no wheezes, crackles, or other adventitious sounds CV: RRR, no m/g/c/r [id number] tenderness, ND, +BS in all four quadrants; no HSM, guarding, ridigity, or rebound tenderness Ext: no edema, well perfused with 2+ pulses, Skin: no rash or lesions noted Lymph: no LAD  Data: Lab Results  Component Value Date   WBC 8.9 04/21/2015   HGB 10.8* 04/21/2015   HCT 31.2* 04/21/2015   MCV 91.9 04/21/2015   PLT 267 04/21/2015    Recent Labs Lab 04/19/15 1340 04/20/15 1952 04/21/15 0422  HGB 13.7 12.9 10.8*   Lab Results  Component Value Date   NA 138 04/21/2015   K 3.2* 04/21/2015   CL 111 04/21/2015   CO2 22 04/21/2015   BUN <5* 04/21/2015   CREATININE 0.69 04/21/2015   Lab Results  Component Value Date   ALT 25 04/20/2015   AST 31 04/20/2015    ALKPHOS 74 04/20/2015   BILITOT 0.3 04/20/2015   No results for input(s): APTT, INR, PTT in the last 168 hours.  Imaging:  CLINICAL DATA: Acute onset of lower abdominal cramping and loss of appetite, status post recent skin biopsy at the right labia. Post biopsy bleeding and erythema. Initial encounter.  EXAM: CT ABDOMEN AND PELVIS WITH CONTRAST  TECHNIQUE: Multidetector CT imaging of the abdomen and pelvis was performed using the standard protocol following bolus administration of intravenous contrast.  CONTRAST: 175mL ISOVUE-300 IOPAMIDOL (ISOVUE-300) INJECTION 61%  COMPARISON: CT of the abdomen and pelvis from 11/25/2014  FINDINGS: The visualized lung bases are clear. A moderate to large hiatal hernia is noted.  The patient is status post cholecystectomy, with clips noted along the gallbladder fossa. There is associated diffuse intrahepatic biliary duct dilatation. The pancreas is grossly unremarkable in appearance. The adrenal glands are unremarkable.  The kidneys are unremarkable in appearance. There is no evidence of hydronephrosis. No renal or ureteral stones are seen. No perinephric stranding is appreciated.  No free fluid is identified. The small bowel is unremarkable in appearance. The stomach is within normal limits. No acute vascular abnormalities are seen.  The patient is status post appendectomy. The colon is partially filled with stool. Scattered diverticulosis is noted along the distal sigmoid colon, without evidence of diverticulitis. A prominent anterior abdominal wall mesh is noted at the lower abdomen.  The bladder is mildly distended and grossly unremarkable. The uterus is within  normal limits. A 2.8 cm right adnexal cystic focus is likely physiologic. No suspicious adnexal masses are seen. No inguinal lymphadenopathy is seen.  Evaluation of the labia demonstrates minimal soft tissue inflammation, without significant deep  inflammation. There is no evidence of abscess.  No acute osseous abnormalities are identified.  IMPRESSION: 1. Minimal soft tissue inflammation at the labia. No significant deep soft tissue inflammation seen. No evidence of abscess. No focal fluid collection seen. 2. Moderate to large hiatal hernia noted. 3. Scattered diverticulosis along the distal sigmoid colon, without evidence of diverticulitis. 4. 2.8 cm right adnexal cystic focus is likely physiologic.   Electronically Signed  By: Garald Balding M.D.  On: 04/17/2015 01:34     CLINICAL DATA: Abdominal pain with nausea, vomiting, and diarrhea  EXAM: CT ABDOMEN AND PELVIS WITH CONTRAST  TECHNIQUE: Multidetector CT imaging of the abdomen and pelvis was performed using the standard protocol following bolus administration of intravenous contrast. Oral contrast was also administered.  CONTRAST: 100mL ISOVUE-300 IOPAMIDOL (ISOVUE-300) INJECTION 61%  COMPARISON: April 17, 2015 and November 25, 2014 ; August 04, 2013  FINDINGS: Lower chest: Lung bases are clear. There is a sizable paraesophageal hernia, incompletely visualized.  Hepatobiliary: Liver is prominent measuring 20.5 cm in length. There is a Riedel's lobe, an anatomic variant. No focal liver lesions are evident. The gallbladder is absent. There is diffuse with intrahepatic and extrahepatic biliary duct dilatation. The common bile duct measures 15 mm in diameter. No mass or calculus is seen by CT on this study.  Pancreas: There is no pancreatic mass or inflammatory focus. Pancreatic duct is upper normal in size but stable.  Spleen: No splenic lesions are evident.  Adrenals/Urinary Tract: Adrenals appear normal bilaterally. Kidneys bilaterally show no mass or hydronephrosis on either side. There is a large extrarenal pelvis on each side, an anatomic variant. There is no renal or ureteral calculus on either side. Urinary bladder is distended  with wall thickness within normal limits.  Stomach/Bowel: There is wall thickening from the distal transverse colon throughout the descending colon to the proximal sigmoid colon with mesenteric thickening but no appreciable diverticular inflammation. There is mild wall enhancement in portions of this area of colonic inflammation. No abscess or perforation is evident. No other bowel wall thickening is apparent. No bowel obstruction. No free air or portal venous air is evident. There are scattered distal sigmoid diverticula without diverticulitis.  Vascular/Lymphatic: There is atherosclerotic calcification in the aorta without apparent aneurysm. The major mesenteric vessels appear patent. There is no appreciable adenopathy in the abdomen or pelvis.  Reproductive: Uterus is anteverted. No pelvic mass or pelvic fluid collection.  Other: Evidence of previous abdominal hernia repair with mesh in place anteriorly. Appendix absent. No ascites or abscess in the abdomen or pelvis.  Musculoskeletal: There are no blastic or lytic bone lesions. No intramuscular or abdominal wall lesion.  IMPRESSION: There is evidence of colitis extending from the distal transverse colon to the proximal sigmoid colon, characterized bowel wall thickening, wall enhancement, and mesenteric thickening. No diverticular seen in this area to indicate that this colitis is due to diverticulitis. No perforation or abscess seen.  Sizable paraesophageal hernia, again noted.  Prominent liver. Gallbladder absent. Biliary duct dilatation is noted without mass or calculus seen by CT in the biliary ductal system.  No renal or ureteral calculus. No hydronephrosis. Urinary bladder distended without wall thickening.  Smash in anterior abdomen consistent with prior hernia repair.  Appendix absent.  Cystic lesion right adnexa. Stability  of this lesion over time is suggestive of benign  etiology.   Electronically Signed  By: Lowella Grip III M.D.  On: 04/19/2015 17:33   Assessment/Plan: Ms. Gath is a 65 y.o. female with possible ischemic colitis.  WBC 14.5 on 4/3, 11.8 on admission, currently 8.9.  On Cipro and Flagyl since 4/3.  Hgb on admission 12.9, currently 10.8.  MCV 91.9.  BUN/Creatinine WNL Stool studies pending   Recommendations: Will evaluate further with colonoscopy tomorrow with Dr. Vira Agar.    We will continue to follow with you. Thank you for the consult. Please call with questions or concerns.  Salvadore Farber, PA-C  I personally performed these services.

## 2015-04-21 NOTE — H&P (Signed)
[facility] [place] NAME: Angela [name]    MR#:  [id number]  DATE OF BIRTH:  1950-04-04  DATE OF ADMISSION:  04/20/2015  PRIMARY CARE PHYSICIAN: Maryland Pink, MD   REQUESTING/REFERRING PHYSICIAN: Reita Cliche, M.D.  CHIEF COMPLAINT:   Chief Complaint  Patient presents with  . Diarrhea    HISTORY OF PRESENT ILLNESS:  Angela [name]  is a 65 y.o. female who presents with Abdominal pain and melena. Patient has been here to the ED several times in the last few days. She presented initially with abdominal pain and diarrhea. She states that sometime after that she began having melena. She was found on CT scan to have a colitis, and was sent home with by mouth antibiotics. She returns today with no improvement in her symptoms. While in the ED she had an acute elevation of her blood pressure, followed by onset of neurological symptoms including left arm sensory deficit and transient aphasia. She also had an episode of chest discomfort while in the ED. She states that she has also been having melena for the past couple of days. Hospitals were called for admission for all the above.  PAST MEDICAL HISTORY:   Past Medical History  Diagnosis Date  . Endometriosis   . Hypothyroidism   . GERD (gastroesophageal reflux disease)   . Bipolar 1 disorder ([place])   . Hypertension   . Hernia, hiatal   . Allergic rhinitis   . Bronchitis   . Endometriosis   . Otalgia   . Otitis media   . Chronic sinusitis   . skin cancer   . Malignant neoplasm of skin   . Facial pain   . Dizziness   . Blurred vision   . Neuropathy ([place])   . Thyroid disease   . Anemia   . Depression   . Migraine   . Chronic constipation   . Diverticulitis   . Diverticulosis   . GERD (gastroesophageal reflux disease)     PAST SURGICAL HISTORY:   Past Surgical History  Procedure Laterality Date  . Tonsillectomy    . Uteral suspension    . Appendectomy    . Hernia repair     . Laproscopy    . Cholecystectomy, laparoscopic    . Cholecystectomy    . Esophagogastroduodenoscopy (egd) with propofol N/A 03/29/2015    Procedure: ESOPHAGOGASTRODUODENOSCOPY (EGD) WITH PROPOFOL;  Surgeon: Josefine Class, MD;  Location: [facility] ENDOSCOPY;  Service: Endoscopy;  Laterality: N/A;    SOCIAL HISTORY:   Social History  Substance Use Topics  . Smoking status: Former Research scientist (life sciences)  . Smokeless tobacco: Never Used  . Alcohol Use: No    FAMILY HISTORY:   Family History  Problem Relation Age of Onset  . Heart disease Father   . Cancer Mother     lung  . Urolithiasis Neg Hx   . Kidney disease Neg Hx   . Kidney cancer Neg Hx   . Prostate cancer Neg Hx     DRUG ALLERGIES:   Allergies  Allergen Reactions  . Bee Venom Hives  . Darvon [Propoxyphene] Anaphylaxis and Hives  . Peanuts [Peanut Oil] Anaphylaxis and Hives  . Penicillins Hives and Other (See Comments)    Has patient had a PCN reaction causing immediate rash, facial/tongue/throat swelling, SOB or lightheadedness with hypotension: Unsure  Has patient had a PCN reaction causing severe rash involving mucus membranes or skin necrosis: Unsure  Has patient had a PCN reaction that  required hospitalization Unsure  Has patient had a PCN reaction occurring within the last 10 years: Unsure  If all of the above answers are "NO", then may proceed with Cephalosporin use.  Marland Kitchen Percocet [Oxycodone-Acetaminophen] Nausea And Vomiting and Anxiety    Reaction:  Hallucinations   . Sulfa Antibiotics Anaphylaxis, Hives and Itching  . Sulfacetamide Sodium Anaphylaxis, Hives and Itching  . Adhesive [Tape] Hives  . Amikacin Nausea And Vomiting  . Aspirin Hives  . Doxycycline Photosensitivity  . Haloperidol Nausea And Vomiting  . Hymenoptera Venom Preparations Hives  . Ibuprofen Hives and Nausea And Vomiting  . Imipramine Hives  . Silicon Hives  . Ultram [Tramadol] Hives  . Epinephrine Palpitations  . Hydroxyzine Anxiety  .  Morphine And Related Nausea And Vomiting, Anxiety and Other (See Comments)    Reaction:  Hallucinations   . Prednisone Nausea And Vomiting, Anxiety and Other (See Comments)    Reaction:  Strange thoughts     MEDICATIONS AT HOME:   Prior to Admission medications   Medication Sig Start Date End Date Taking? Authorizing Provider  acetaminophen (RA ACETAMINOPHEN) 650 MG CR tablet Take by mouth. 02/03/15   Historical Provider, MD  amLODipine (NORVASC) 2.5 MG tablet Take 2.5 mg by mouth daily.    Historical Provider, MD  Biotin 5000 MCG CAPS Take 1 capsule by mouth daily.     Historical Provider, MD  calcium carbonate (OS-CAL) 600 MG TABS tablet Take 600 mg by mouth 3 (three) times daily with meals.    Historical Provider, MD  calcium carbonate (OSCAL) 1500 (600 Ca) MG TABS tablet Take by mouth. 02/03/15   Historical Provider, MD  cephALEXin (KEFLEX) 500 MG capsule Take 1 capsule (500 mg total) by mouth 4 (four) times daily. 04/17/15   Delman Kitten, MD  cetirizine (ZYRTEC) 10 MG tablet Take 10 mg by mouth daily.    Historical Provider, MD  Cholecalciferol (VITAMIN D-1000 MAX ST) 1000 units tablet Take by mouth. 02/03/15   Historical Provider, MD  ciprofloxacin (CIPRO) 500 MG tablet Take 1 tablet (500 mg total) by mouth 2 (two) times daily. 04/19/15 05/03/15  Nance Pear, MD  conjugated estrogens (PREMARIN) vaginal cream Place 1 Applicatorful vaginally daily.    Historical Provider, MD  cycloSPORINE (RESTASIS) 0.05 % ophthalmic emulsion Apply to eye. 03/31/15 06/29/15  Historical Provider, MD  divalproex (DEPAKOTE) 500 MG DR tablet Take 500 mg by mouth 3 (three) times daily.    Historical Provider, MD  EPINEPHrine (EPIPEN 2-PAK) 0.3 mg/0.3 mL IJ SOAJ injection Inject 0.3 mg into the skin.    Historical Provider, MD  erythromycin ophthalmic ointment 1 application at bedtime.    Historical Provider, MD  estradiol (ESTRING) 2 MG vaginal ring Place 2 mg vaginally every 3 (three) months. follow package  directions Patient not taking: Reported on 04/20/2015 04/13/15   Rubie Maid, MD  fluconazole (DIFLUCAN) 150 MG tablet Take 1 tablet (150 mg total) by mouth once. Can take additional dose three days later if symptoms persist 04/13/15   Rubie Maid, MD  HYDROcodone-acetaminophen (NORCO/VICODIN) 5-325 MG per tablet Take 1 tablet by mouth every 6 (six) hours as needed for moderate pain. 04/30/13   Max T Hyatt, DPM  hydrocortisone (ANUSOL-HC) 2.5 % rectal cream Place 1 application rectally 2 (two) times daily.    Historical Provider, MD  Hypromellose (GENTEAL MILD) 0.2 % SOLN Apply to eye.    Historical Provider, MD  ipratropium (ATROVENT) 0.03 % nasal spray Place 2 sprays into both nostrils every  12 (twelve) hours.    Historical Provider, MD  LACTOBACILLUS EXTRA STRENGTH CAPS Take by mouth.    Historical Provider, MD  LACTOBACILLUS PO Take by mouth.    Historical Provider, MD  lactulose (CEPHULAC) 10 G packet Take 1 packet (10 g total) by mouth daily as needed (constipation). 06/03/14   Orbie Pyo, MD  levothyroxine (SYNTHROID, LEVOTHROID) 88 MCG tablet Take 88 mcg by mouth daily before breakfast.    Historical Provider, MD  lidocaine (XYLOCAINE) 2 % solution  03/29/15   Historical Provider, MD  Lifitegrast Shirley Friar) 5 % SOLN Apply to eye.    Historical Provider, MD  Linaclotide Rolan Lipa) 290 MCG CAPS capsule Take 290 mcg by mouth daily.    Historical Provider, MD  magnesium hydroxide (MILK OF MAGNESIA) 400 MG/5ML suspension Take by mouth daily as needed for mild constipation.    Historical Provider, MD  magnesium sulfate (EPSOM SALTS) crystals Use 1/2 cup X one quart warm water daily. 05/19/13   Max T Hyatt, DPM  Menthol-Methyl Salicylate (ICY HOT EXTRA STRENGTH) 10-30 % CREA Apply topically.    Historical Provider, MD  metroNIDAZOLE (FLAGYL) 500 MG tablet Take 1 tablet (500 mg total) by mouth 3 (three) times daily. 04/19/15 05/03/15  Nance Pear, MD  multivitamin-iron-minerals-folic acid  (THERAPEUTIC-M) TABS tablet Take 1 tablet by mouth daily.    Historical Provider, MD  Omega-3 Krill Oil 1000 MG CAPS Take by mouth.    Historical Provider, MD  ondansetron (ZOFRAN-ODT) 8 MG disintegrating tablet Take 8 mg by mouth every 8 (eight) hours as needed for nausea or vomiting.    Historical Provider, MD  pantoprazole (PROTONIX) 40 MG tablet Take 40 mg by mouth daily.    Historical Provider, MD  PARoxetine (PAXIL) 30 MG tablet Take 30 mg by mouth daily.    Historical Provider, MD  promethazine (PHENERGAN) 12.5 MG tablet Take 1 tablet (12.5 mg total) by mouth every 6 (six) hours as needed for nausea or vomiting. 11/25/14   Harvest Dark, MD  promethazine (PHENERGAN) 25 MG tablet Take 1 tablet (25 mg total) by mouth every 6 (six) hours as needed for nausea or vomiting. 04/01/15   Jenise V Bacon Menshew, PA-C  Spirulina 500 MG TABS Take by mouth.    Historical Provider, MD  terbinafine (LAMISIL) 1 % cream Apply 1 application topically 2 (two) times daily.    Historical Provider, MD  terconazole (TERAZOL 7) 0.4 % vaginal cream Place 1 applicator vaginally at bedtime.    Historical Provider, MD  tiZANidine (ZANAFLEX) 4 MG tablet Take 4 mg by mouth every 6 (six) hours as needed for muscle spasms.    Historical Provider, MD  triamcinolone cream (KENALOG) 0.1 % Apply 1 application topically 2 (two) times daily.    Historical Provider, MD  zolpidem (AMBIEN) 10 MG tablet Take 10 mg by mouth at bedtime as needed for sleep.    Historical Provider, MD    REVIEW OF SYSTEMS:  Review of Systems  Constitutional: Positive for fever (subjective) and malaise/fatigue. Negative for chills and weight loss.  HENT: Negative for ear pain, hearing loss and tinnitus.   Eyes: Negative for blurred vision, double vision, pain and redness.  Respiratory: Negative for cough, hemoptysis and shortness of breath.   Cardiovascular: Positive for chest pain. Negative for palpitations, orthopnea and leg swelling.   Gastrointestinal: Positive for nausea, abdominal pain, diarrhea and melena. Negative for vomiting and constipation.  Genitourinary: Negative for dysuria, frequency and hematuria.  Musculoskeletal: Negative for back pain,  joint pain and neck pain.  Skin:       No acne, rash, or lesions  Neurological: Positive for speech change. Negative for dizziness, tremors, focal weakness and weakness.  Endo/Heme/Allergies: Negative for polydipsia. Does not bruise/bleed easily.  Psychiatric/Behavioral: Negative for depression. The patient is not nervous/anxious and does not have insomnia.      VITAL SIGNS:   Filed Vitals:   04/20/15 2030 04/20/15 2047 04/20/15 2200 04/20/15 2230  BP: 177/123 149/89 151/75 139/83  Pulse:  66 58   Temp:      TempSrc:      Resp: 13 18 13 11   SpO2:  97% 100%    Wt Readings from Last 3 Encounters:  04/19/15 53.978 kg (119 lb)  04/16/15 58.968 kg (130 lb)  04/13/15 53.978 kg (119 lb)    PHYSICAL EXAMINATION:  Physical Exam  Vitals reviewed. Constitutional: She is oriented to person, place, and time. She appears well-developed and well-nourished. No distress.  HENT:  Head: Normocephalic and atraumatic.  Dry mucous membranes  Eyes: Conjunctivae and EOM are normal. Pupils are equal, round, and reactive to light. No scleral icterus.  Neck: Normal range of motion. Neck supple. No JVD present. No thyromegaly present.  Cardiovascular: Normal rate, regular rhythm and intact distal pulses.  Exam reveals no gallop and no friction rub.   No murmur heard. Respiratory: Effort normal and breath sounds normal. No respiratory distress. She has no wheezes. She has no rales.  GI: Soft. Bowel sounds are normal. She exhibits no distension. There is tenderness (low abdominal tenderness).  Musculoskeletal: Normal range of motion. She exhibits no edema.  No arthritis, no gout  Lymphadenopathy:    She has no cervical adenopathy.  Neurological: She is alert and oriented to person,  place, and time. No cranial nerve deficit.  No dysarthria, no aphasia  Skin: Skin is warm and dry. No rash noted. No erythema.  Psychiatric: She has a normal mood and affect. Her behavior is normal. Judgment and thought content normal.    LABORATORY PANEL:   CBC  Recent Labs Lab 04/20/15 1952  WBC 11.8*  HGB 12.9  HCT 38.3  PLT 313   ------------------------------------------------------------------------------------------------------------------  Chemistries   Recent Labs Lab 04/20/15 1952  NA 134*  K 3.5  CL 104  CO2 24  GLUCOSE 80  BUN 5*  CREATININE 0.82  CALCIUM 8.9  AST 31  ALT 25  ALKPHOS 74  BILITOT 0.3   ------------------------------------------------------------------------------------------------------------------  Cardiac Enzymes  Recent Labs Lab 04/20/15 1952  TROPONINI <0.03   ------------------------------------------------------------------------------------------------------------------  RADIOLOGY:  Ct Head Wo Contrast  04/20/2015  CLINICAL DATA:  Weakness, left arm numbness and a aphasia. EXAM: CT HEAD WITHOUT CONTRAST TECHNIQUE: Contiguous axial images were obtained from the base of the skull through the vertex without intravenous contrast. COMPARISON:  03/04/2005 FINDINGS: Mild small vessel disease present. The brain demonstrates no evidence of hemorrhage, infarction, edema, mass effect, extra-axial fluid collection, hydrocephalus or mass lesion. The skull is unremarkable. IMPRESSION: No acute findings.  Mild small vessel disease. Electronically Signed   By: Aletta Edouard M.D.   On: 04/20/2015 20:52   Ct Abdomen Pelvis W Contrast  04/19/2015  CLINICAL DATA:  Abdominal pain with nausea, vomiting, and diarrhea EXAM: CT ABDOMEN AND PELVIS WITH CONTRAST TECHNIQUE: Multidetector CT imaging of the abdomen and pelvis was performed using the standard protocol following bolus administration of intravenous contrast. Oral contrast was also administered.  CONTRAST:  19mL ISOVUE-300 IOPAMIDOL (ISOVUE-300) INJECTION 61% COMPARISON:  April 17, 2015 and November 25, 2014 ; August 04, 2013 FINDINGS: Lower chest: Lung bases are clear. There is a sizable paraesophageal hernia, incompletely visualized. Hepatobiliary: Liver is prominent measuring 20.5 cm in length. There is a Riedel's lobe, an anatomic variant. No focal liver lesions are evident. The gallbladder is absent. There is diffuse with intrahepatic and extrahepatic biliary duct dilatation. The common bile duct measures 15 mm in diameter. No mass or calculus is seen by CT on this study. Pancreas: There is no pancreatic mass or inflammatory focus. Pancreatic duct is upper normal in size but stable. Spleen: No splenic lesions are evident. Adrenals/Urinary Tract: Adrenals appear normal bilaterally. Kidneys bilaterally show no mass or hydronephrosis on either side. There is a large extrarenal pelvis on each side, an anatomic variant. There is no renal or ureteral calculus on either side. Urinary bladder is distended with wall thickness within normal limits. Stomach/Bowel: There is wall thickening from the distal transverse colon throughout the descending colon to the proximal sigmoid colon with mesenteric thickening but no appreciable diverticular inflammation. There is mild wall enhancement in portions of this area of colonic inflammation. No abscess or perforation is evident. No other bowel wall thickening is apparent. No bowel obstruction. No free air or portal venous air is evident. There are scattered distal sigmoid diverticula without diverticulitis. Vascular/Lymphatic: There is atherosclerotic calcification in the aorta without apparent aneurysm. The major mesenteric vessels appear patent. There is no appreciable adenopathy in the abdomen or pelvis. Reproductive: Uterus is anteverted. No pelvic mass or pelvic fluid collection. Other: Evidence of previous abdominal hernia repair with mesh in place anteriorly. Appendix  absent. No ascites or abscess in the abdomen or pelvis. Musculoskeletal: There are no blastic or lytic bone lesions. No intramuscular or abdominal wall lesion. IMPRESSION: There is evidence of colitis extending from the distal transverse colon to the proximal sigmoid colon, characterized bowel wall thickening, wall enhancement, and mesenteric thickening. No diverticular seen in this area to indicate that this colitis is due to diverticulitis. No perforation or abscess seen. Sizable paraesophageal hernia, again noted. Prominent liver. Gallbladder absent. Biliary duct dilatation is noted without mass or calculus seen by CT in the biliary ductal system. No renal or ureteral calculus. No hydronephrosis. Urinary bladder distended without wall thickening. Smash in anterior abdomen consistent with prior hernia repair. Appendix absent. Cystic lesion right adnexa. Stability of this lesion over time is suggestive of benign etiology. Electronically Signed   By: Lowella Grip III M.D.   On: 04/19/2015 17:33   Dg Chest Port 1 View  04/20/2015  CLINICAL DATA:  Diarrhea and rectal bleeding, recently discharged from the hospital for same. EXAM: PORTABLE CHEST 1 VIEW COMPARISON:  06/03/2014 FINDINGS: A single AP portable view of the chest demonstrates no focal airspace consolidation or alveolar edema. The lungs are grossly clear. There is no large effusion or pneumothorax. Cardiac and mediastinal contours appear unremarkable. IMPRESSION: No active disease. Electronically Signed   By: Andreas Newport M.D.   On: 04/20/2015 21:05    EKG:   Orders placed or performed during the hospital encounter of 04/20/15  . ED EKG  . ED EKG  . EKG 12-Lead  . EKG 12-Lead    IMPRESSION AND PLAN:  Principal Problem:   Colitis, acute - patient was unable to tolerate her by mouth antibiotics. We will admit her here with IV antibiotics and a GI consult for her colitis as well as her melena, see below. Active Problems:   Melena -  hemoglobin is  stable, though about one point decreased from couple of days ago. We will have her on IV twice a day PPI, and get a GI consult for workup. She is hemodynamically stable.   Accelerated hypertension - blood pressure is currently controlled, her recent spike in blood pressure may very well have been due to her emotional state. However, we will continue her home medicines and monitor her blood pressure closely, and use additional when necessary antihypertensives if it rises greater than 160/100.   TIA (transient ischemic attack) - seen by telemetry neurology, recommend workup for TIA. Workup ordered per stroke/TIA order set.   Chest pain - initial troponin negative, trend serially tonight.   Hypothyroidism - continue home dose thyroid replacement   GERD (gastroesophageal reflux disease) - twice a day PPI as above for now, then revert to home dose once GI workup complete  All the records are reviewed and case discussed with ED provider. Management plans discussed with the patient and/or family.  DVT PROPHYLAXIS: Mechanical only  GI PROPHYLAXIS: PPI  ADMISSION STATUS: Inpatient  CODE STATUS: Full Code Status History    This patient does not have a recorded code status. Please follow your organizational policy for patients in this situation.      TOTAL TIME TAKING CARE OF THIS PATIENT: 55 minutes.    Akeira Lahm Woodward 04/21/2015, 12:00 AM  [facility]  Office  [phone]  CC: Primary care physician; Maryland Pink, MD

## 2015-04-21 NOTE — Progress Notes (Signed)
PT Cancellation Note  Patient Details Name: Angela [name] MRN: [id number] DOB: Dec 22, 1950   Cancelled Treatment:    Reason Eval/Treat Not Completed: Patient declined, no reason specified. Patient seems quite anxious prior to session even initiating. She asks PT to take her blood pressure, 144/77. She reports this is too high and wants PT to alert her RN as she is concerned of another "mini-stroke". PT alerted RN, will defer tx today as patient declined any further mobility.   Kerman Passey, PT, DPT    04/21/2015, 2:34 PM

## 2015-04-21 NOTE — Progress Notes (Signed)
°   04/21/15 1500  Clinical Encounter Type  Visited With Patient  Visit Type Follow-up  Referral From Other (Comment)  Consult/Referral To Chaplain  Stress Factors  Patient Stress Factors Health changes  Attempted follow up visit to review Advance Directives.  Patient was very upset and remarked on her elevated blood pressure. She said she did not want to see a chaplain and asked that I get medical assistance for her instead. [facility] Ext [phone]

## 2015-04-21 NOTE — Progress Notes (Signed)
Mri / echo and caroitd [place] today.  Chair today/ pt very very anxious and gets agitated and hyper easily.  Pt refused meds   Today  Wanted all iv meds  And no pills. Dr mody notified  Of this.  Dr Vira Agar saw today. Pt to have  Colonoscopy tomorrow.periods of calm  Alt with  Agitation and  Rants.

## 2015-04-22 ENCOUNTER — Inpatient Hospital Stay (HOSPITAL_COMMUNITY)
Admit: 2015-04-22 | Discharge: 2015-04-22 | Disposition: A | Discharge status: Home / Self Care | Payer: Medicare Other | Attending: Internal Medicine | Admitting: Internal Medicine

## 2015-04-22 ENCOUNTER — Encounter
Admission: EM | Disposition: A | Discharge status: Skilled Nursing Facility | Payer: Self-pay | Source: Home / Self Care | Attending: Internal Medicine

## 2015-04-22 ENCOUNTER — Telehealth: Payer: Self-pay

## 2015-04-22 DIAGNOSIS — G459 Transient cerebral ischemic attack, unspecified: Secondary | ICD-10-CM

## 2015-04-22 DIAGNOSIS — N950 Postmenopausal bleeding: Secondary | ICD-10-CM

## 2015-04-22 LAB — GASTROINTESTINAL PANEL BY PCR, STOOL (REPLACES STOOL CULTURE)
Adenovirus F40/41: NOT DETECTED
Astrovirus: NOT DETECTED
Campylobacter species: NOT DETECTED
Cryptosporidium: NOT DETECTED
Cyclospora cayetanensis: NOT DETECTED
E. COLI O157: NOT DETECTED
ENTAMOEBA HISTOLYTICA: NOT DETECTED
ENTEROAGGREGATIVE E COLI (EAEC): NOT DETECTED
ENTEROTOXIGENIC E COLI (ETEC): NOT DETECTED
Enteropathogenic E coli (EPEC): NOT DETECTED
GIARDIA LAMBLIA: NOT DETECTED
NOROVIRUS GI/GII: NOT DETECTED
PLESIMONAS SHIGELLOIDES: NOT DETECTED
Rotavirus A: NOT DETECTED
SAPOVIRUS (I, II, IV, AND V): NOT DETECTED
Salmonella species: NOT DETECTED
Shiga like toxin producing E coli (STEC): NOT DETECTED
Shigella/Enteroinvasive E coli (EIEC): NOT DETECTED
VIBRIO CHOLERAE: NOT DETECTED
Vibrio species: NOT DETECTED
Yersinia enterocolitica: NOT DETECTED

## 2015-04-22 LAB — CBC
HCT: 35.7 % (ref 35.0–47.0)
Hemoglobin: 12.4 g/dL (ref 12.0–16.0)
MCH: 31.7 pg (ref 26.0–34.0)
MCHC: 34.6 g/dL (ref 32.0–36.0)
MCV: 91.5 fL (ref 80.0–100.0)
PLATELETS: 299 10*3/uL (ref 150–440)
RBC: 3.9 MIL/uL (ref 3.80–5.20)
RDW: 13.5 % (ref 11.5–14.5)
WBC: 6.1 10*3/uL (ref 3.6–11.0)

## 2015-04-22 LAB — ECHOCARDIOGRAM COMPLETE
Height: 60 in
Weight: 1940.8 oz

## 2015-04-22 LAB — BASIC METABOLIC PANEL
ANION GAP: 6 (ref 5–15)
BUN: <5 mg/dL — ABNORMAL LOW (ref 6–20)
CALCIUM: 8.5 mg/dL — AB (ref 8.9–10.3)
CO2: 22 mmol/L (ref 22–32)
Chloride: 107 mmol/L (ref 101–111)
Creatinine, Ser: 0.68 mg/dL (ref 0.44–1.00)
Glucose, Bld: 90 mg/dL (ref 65–99)
Potassium: 3.5 mmol/L (ref 3.5–5.1)
SODIUM: 135 mmol/L (ref 135–145)

## 2015-04-22 LAB — HEMOGLOBIN A1C: HEMOGLOBIN A1C: 5.7 % (ref 4.0–6.0)

## 2015-04-22 LAB — VITAMIN B12: VITAMIN B 12: 491 pg/mL (ref 180–914)

## 2015-04-22 LAB — TSH: TSH: 10.383 u[IU]/mL — AB (ref 0.350–4.500)

## 2015-04-22 SURGERY — COLONOSCOPY WITH PROPOFOL
Anesthesia: General

## 2015-04-22 MED ORDER — ZOLPIDEM TARTRATE 5 MG PO TABS
5.0000 mg | ORAL_TABLET | Freq: Every evening | ORAL | Status: DC | PRN
  Administered 2015-04-23 – 2015-04-24 (×2): 5 mg via ORAL
  Filled 2015-04-22 (×2): qty 1

## 2015-04-22 MED ORDER — LEVOTHYROXINE SODIUM 100 MCG PO TABS
100.0000 ug | ORAL_TABLET | Freq: Every day | ORAL | Status: DC
  Administered 2015-04-23 – 2015-04-26 (×4): 100 ug via ORAL
  Filled 2015-04-22 (×4): qty 1

## 2015-04-22 MED ORDER — BOOST / RESOURCE BREEZE PO LIQD
1.0000 | Freq: Three times a day (TID) | ORAL | Status: DC
  Administered 2015-04-22 – 2015-04-26 (×7): 1 via ORAL

## 2015-04-22 MED ORDER — VALPROATE SODIUM 250 MG/5ML PO SYRP
500.0000 mg | ORAL_SOLUTION | Freq: Three times a day (TID) | ORAL | Status: DC
  Filled 2015-04-22 (×2): qty 10

## 2015-04-22 MED ORDER — CYCLOSPORINE 0.05 % OP EMUL
1.0000 [drp] | Freq: Two times a day (BID) | OPHTHALMIC | Status: DC
  Administered 2015-04-22 – 2015-04-26 (×8): 1 [drp] via OPHTHALMIC
  Filled 2015-04-22 (×9): qty 1

## 2015-04-22 MED ORDER — VALPROATE SODIUM 250 MG/5ML PO SYRP
250.0000 mg | ORAL_SOLUTION | Freq: Three times a day (TID) | ORAL | Status: DC
  Administered 2015-04-22: 23:00:00 250 mg via ORAL
  Filled 2015-04-22 (×4): qty 5

## 2015-04-22 NOTE — Care Management Important Message (Signed)
Important Message  Patient Details  Name: FINOLA STATZER MRN: [id number] Date of Birth: 06-Oct-1950   Medicare Important Message Given:  Yes    Shelbie Ammons, RN 04/22/2015, 11:26 AM

## 2015-04-22 NOTE — Consult Note (Addendum)
Referring Physician: Mody    Chief Complaint: Left arm numbness and aphasia  HPI: Angela [name] is an 65 y.o. female who is a very difficult historian.  Patient had an episode when her blood pressure was high while in the ED. She then developed tingling that she reports was from the elbow down in the LUE.  Had some difficulty with speech at that time as well.  It appears that her speech cleared rather quickly but she had sensory deficits that lasted throughout the night.  Reported they had resolved until she touched herself and reported that she continued to have sensory deficits from her elbow to her wrist in her LUE.  Code stroke was called in the ED.    Date last known well: 04/20/2015 Time last known well: Time: 21:35 tPA Given: No: Resolution of symptoms, rectal bleeding, presenting complaint of melena  Past Medical History  Diagnosis Date  . Endometriosis   . Hypothyroidism   . GERD (gastroesophageal reflux disease)   . Bipolar 1 disorder ([place])   . Hypertension   . Hernia, hiatal   . Allergic rhinitis   . Bronchitis   . Endometriosis   . Otalgia   . Otitis media   . Chronic sinusitis   . skin cancer   . Malignant neoplasm of skin   . Facial pain   . Dizziness   . Blurred vision   . Neuropathy ([place])   . Thyroid disease   . Anemia   . Depression   . Migraine   . Chronic constipation   . Diverticulitis   . Diverticulosis   . GERD (gastroesophageal reflux disease)     Past Surgical History  Procedure Laterality Date  . Tonsillectomy    . Uteral suspension    . Appendectomy    . Hernia repair    . Laproscopy    . Cholecystectomy, laparoscopic    . Cholecystectomy    . Esophagogastroduodenoscopy (egd) with propofol N/A 03/29/2015    Procedure: ESOPHAGOGASTRODUODENOSCOPY (EGD) WITH PROPOFOL;  Surgeon: Josefine Class, MD;  Location: [facility] ENDOSCOPY;  Service: Endoscopy;  Laterality: N/A;    Family History  Problem Relation Age of Onset  . Heart disease  Father   . Cancer Mother     lung  . Urolithiasis Neg Hx   . Kidney disease Neg Hx   . Kidney cancer Neg Hx   . Prostate cancer Neg Hx    Social History:  reports that she has quit smoking. She has never used smokeless tobacco. She reports that she does not drink alcohol or use illicit drugs.  Allergies:  Allergies  Allergen Reactions  . Bee Venom Hives  . Darvon [Propoxyphene] Anaphylaxis and Hives  . Peanuts [Peanut Oil] Anaphylaxis and Hives  . Penicillins Hives and Other (See Comments)    Has patient had a PCN reaction causing immediate rash, facial/tongue/throat swelling, SOB or lightheadedness with hypotension: Unsure  Has patient had a PCN reaction causing severe rash involving mucus membranes or skin necrosis: Unsure  Has patient had a PCN reaction that required hospitalization Unsure  Has patient had a PCN reaction occurring within the last 10 years: Unsure  If all of the above answers are "NO", then may proceed with Cephalosporin use.  Marland Kitchen Percocet [Oxycodone-Acetaminophen] Nausea And Vomiting and Anxiety    Reaction:  Hallucinations   . Sulfa Antibiotics Anaphylaxis, Hives and Itching  . Sulfacetamide Sodium Anaphylaxis, Hives and Itching  . Adhesive [Tape] Hives  . Amikacin Nausea And  Vomiting  . Aspirin Hives  . Doxycycline Photosensitivity  . Haloperidol Nausea And Vomiting  . Hymenoptera Venom Preparations Hives  . Ibuprofen Hives and Nausea And Vomiting  . Imipramine Hives  . Silicon Hives  . Ultram [Tramadol] Hives  . Epinephrine Palpitations  . Hydroxyzine Anxiety  . Morphine And Related Nausea And Vomiting, Anxiety and Other (See Comments)    Reaction:  Hallucinations   . Prednisone Nausea And Vomiting, Anxiety and Other (See Comments)    Reaction:  Strange thoughts     Medications:  I have reviewed the patient's current medications. Prior to Admission:  Prescriptions prior to admission  Medication Sig Dispense Refill Last Dose  . ciprofloxacin  (CIPRO) 500 MG tablet Take 1 tablet (500 mg total) by mouth 2 (two) times daily. 14 tablet 0   . fluconazole (DIFLUCAN) 150 MG tablet Take 1 tablet (150 mg total) by mouth once. Can take additional dose three days later if symptoms persist 1 tablet 3 04/15/2015 at Unknown time  . acetaminophen (RA ACETAMINOPHEN) 650 MG CR tablet Take by mouth.   Taking  . amLODipine (NORVASC) 2.5 MG tablet Take 2.5 mg by mouth daily.   Taking  . Biotin 5000 MCG CAPS Take 1 capsule by mouth daily.    Taking  . calcium carbonate (OS-CAL) 600 MG TABS tablet Take 600 mg by mouth 3 (three) times daily with meals.   Taking  . calcium carbonate (OSCAL) 1500 (600 Ca) MG TABS tablet Take by mouth.   Taking  . cetirizine (ZYRTEC) 10 MG tablet Take 10 mg by mouth daily.   Taking  . Cholecalciferol (VITAMIN D-1000 MAX ST) 1000 units tablet Take by mouth.   Taking  . conjugated estrogens (PREMARIN) vaginal cream Place 1 Applicatorful vaginally daily.   Taking  . cycloSPORINE (RESTASIS) 0.05 % ophthalmic emulsion Apply to eye.   Taking  . divalproex (DEPAKOTE ER) 250 MG 24 hr tablet Take 1 tablet by mouth 3 (three) times daily.     . divalproex (DEPAKOTE) 500 MG DR tablet Take 500 mg by mouth 3 (three) times daily.   Taking  . EPINEPHrine (EPIPEN 2-PAK) 0.3 mg/0.3 mL IJ SOAJ injection Inject 0.3 mg into the skin.   Taking  . erythromycin ophthalmic ointment 1 application at bedtime.   Taking  . HYDROcodone-acetaminophen (NORCO/VICODIN) 5-325 MG per tablet Take 1 tablet by mouth every 6 (six) hours as needed for moderate pain. 5 tablet 0 Taking  . hydrocortisone (ANUSOL-HC) 2.5 % rectal cream Place 1 application rectally 2 (two) times daily.   Taking  . Hypromellose (GENTEAL MILD) 0.2 % SOLN Apply to eye.   Taking  . ipratropium (ATROVENT) 0.03 % nasal spray Place 2 sprays into both nostrils every 12 (twelve) hours.   Taking  . LACTOBACILLUS EXTRA STRENGTH CAPS Take by mouth.   Taking  . LACTOBACILLUS PO Take by mouth.   Taking   . lactulose (CEPHULAC) 10 G packet Take 1 packet (10 g total) by mouth daily as needed (constipation). 10 each 0 Taking  . levothyroxine (SYNTHROID, LEVOTHROID) 88 MCG tablet Take 88 mcg by mouth daily before breakfast.   Taking  . lidocaine (XYLOCAINE) 2 % solution    Taking  . Lifitegrast (XIIDRA) 5 % SOLN Apply to eye.   Taking  . Linaclotide (LINZESS) 290 MCG CAPS capsule Take 290 mcg by mouth daily.   Taking  . magnesium hydroxide (MILK OF MAGNESIA) 400 MG/5ML suspension Take by mouth daily as needed for mild constipation.  Taking  . magnesium sulfate (EPSOM SALTS) crystals Use 1/2 cup X one quart warm water daily. 1816 g 3 Taking  . Menthol-Methyl Salicylate (ICY HOT EXTRA STRENGTH) 10-30 % CREA Apply topically.   Taking  . metroNIDAZOLE (FLAGYL) 500 MG tablet Take 1 tablet (500 mg total) by mouth 3 (three) times daily. 21 tablet 0   . multivitamin-iron-minerals-folic acid (THERAPEUTIC-M) TABS tablet Take 1 tablet by mouth daily.   Taking  . Omega-3 Krill Oil 1000 MG CAPS Take by mouth.   Taking  . ondansetron (ZOFRAN-ODT) 8 MG disintegrating tablet Take 8 mg by mouth every 8 (eight) hours as needed for nausea or vomiting.   Taking  . pantoprazole (PROTONIX) 40 MG tablet Take 40 mg by mouth daily.   Taking  . PARoxetine (PAXIL) 30 MG tablet Take 30 mg by mouth daily.   Taking  . promethazine (PHENERGAN) 12.5 MG tablet Take 1 tablet (12.5 mg total) by mouth every 6 (six) hours as needed for nausea or vomiting. 15 tablet 0 Taking  . promethazine (PHENERGAN) 25 MG tablet Take 1 tablet (25 mg total) by mouth every 6 (six) hours as needed for nausea or vomiting. 10 tablet 0 Taking  . Spirulina 500 MG TABS Take by mouth.   Taking  . terbinafine (LAMISIL) 1 % cream Apply 1 application topically 2 (two) times daily.   Taking  . terconazole (TERAZOL 7) 0.4 % vaginal cream Place 1 applicator vaginally at bedtime.   Taking  . tiZANidine (ZANAFLEX) 4 MG tablet Take 4 mg by mouth every 6 (six) hours  as needed for muscle spasms.   Taking  . triamcinolone cream (KENALOG) 0.1 % Apply 1 application topically 2 (two) times daily.   Taking  . zolpidem (AMBIEN) 10 MG tablet Take 10 mg by mouth at bedtime as needed for sleep.   Taking   Scheduled: . amLODipine  2.5 mg Oral Daily  . ciprofloxacin  400 mg Intravenous Q12H  . divalproex  500 mg Oral TID  . levothyroxine  88 mcg Oral QAC breakfast  . metronidazole  500 mg Intravenous Q8H  . pantoprazole (PROTONIX) IV  40 mg Intravenous Q12H  . PARoxetine  30 mg Oral Daily  . sodium chloride flush  3 mL Intravenous Q12H    ROS: History obtained from the patient  General ROS: negative for - chills, fatigue, fever, night sweats, weight gain or weight loss Psychological ROS: negative for - behavioral disorder, hallucinations, memory difficulties, mood swings or suicidal ideation Ophthalmic ROS: negative for - blurry vision, double vision, eye pain or loss of vision ENT ROS: negative for - epistaxis, nasal discharge, oral lesions, sore throat, tinnitus or vertigo Allergy and Immunology ROS: negative for - hives or itchy/watery eyes Hematological and Lymphatic ROS: negative for - bleeding problems, bruising or swollen lymph nodes Endocrine ROS: negative for - galactorrhea, hair pattern changes, polydipsia/polyuria or temperature intolerance Respiratory ROS: negative for - cough, hemoptysis, shortness of breath or wheezing Cardiovascular ROS: negative for - chest pain, dyspnea on exertion, edema or irregular heartbeat Gastrointestinal ROS: abdominal pain, diarrhea, melena Genito-Urinary ROS: negative for - dysuria, hematuria, incontinence or urinary frequency/urgency Musculoskeletal ROS: negative for - joint swelling or muscular weakness Neurological ROS: as noted in HPI, numb feet Dermatological ROS: negative for rash and skin lesion changes  Physical Examination: Blood pressure 139/83, pulse 70, temperature 97.9 F (36.6 C), temperature source  Oral, resp. rate 20, height 5' (1.524 m), weight 55.021 kg (121 lb 4.8 oz), SpO2 100 %.  HEENT-  Normocephalic, no lesions, without obvious abnormality.  Normal external eye and conjunctiva.  Normal TM's bilaterally.  Normal auditory canals and external ears. Normal external nose, mucus membranes and septum.  Normal pharynx. Cardiovascular- S1, S2 normal, pulses palpable throughout   Lungs- chest clear, no wheezing, rales, normal symmetric air entry Abdomen- soft, non-tender; bowel sounds normal; no masses,  no organomegaly Extremities- no edema Lymph-no adenopathy palpable Musculoskeletal-no joint tenderness, deformity or swelling Skin-warm and dry, no hyperpigmentation, vitiligo, or suspicious lesions  Neurological Examination Mental Status: Alert, oriented, thought content appropriate.  Speech fluent without evidence of aphasia.  Easily distracted.  Tangential.  Able to follow 3 step commands without difficulty. Cranial Nerves: II: Discs flat bilaterally; Visual fields grossly normal, pupils equal, round, reactive to light and accommodation III,IV, VI: ptosis not present, extra-ocular motions intact bilaterally V,VII: smile symmetric, facial light touch sensation normal bilaterally VIII: hearing normal bilaterally IX,X: gag reflex present XI: bilateral shoulder shrug XII: midline tongue extension Motor: Patient able to lift both upper extremities and maintain against gravity but unable to resist pressure of any type.  5/5 in BLE's   Sensory: Patient has to touch herself to determine where she is numb.  Reports decreased sensation from the elbow to the wrist on the LUE.   Deep Tendon Reflexes: 2+ and symmetric throughout Plantars: Right: downgoing   Left: downgoing Cerebellar: Normal finger-to-nose and normal heel-to-shin testing bilaterally Gait: not tested due to safety concerns    Laboratory Studies:  Basic Metabolic Panel:  Recent Labs Lab 04/16/15 2328 04/19/15 1340  04/20/15 1952 04/21/15 0422 04/22/15 0513  NA 131* 126* 134* 138 135  K 3.7 3.3* 3.5 3.2* 3.5  CL 100* 93* 104 111 107  CO2 24 21* 24 22 22   GLUCOSE 88 97 80 121* 90  BUN 15 8 5* <5* <5*  CREATININE 0.56 1.01* 0.82 0.69 0.68  CALCIUM 8.9 9.1 8.9 7.8* 8.5*  MG  --   --   --  1.8  --     Liver Function Tests:  Recent Labs Lab 04/16/15 2328 04/19/15 1340 04/20/15 1952  AST 22 59* 31  ALT 15 37 25  ALKPHOS 82 79 74  BILITOT 0.3 0.8 0.3  PROT 7.3 7.4 7.3  ALBUMIN 4.1 4.0 3.8    Recent Labs Lab 04/16/15 2328 04/19/15 1340 04/20/15 1952  LIPASE 34 18 16   No results for input(s): AMMONIA in the last 168 hours.  CBC:  Recent Labs Lab 04/16/15 2328 04/19/15 1340 04/20/15 1952 04/21/15 0422 04/22/15 0513  WBC 9.8 14.5* 11.8* 8.9 6.1  NEUTROABS  --   --  9.3*  --   --   HGB 13.4 13.7 12.9 10.8* 12.4  HCT 40.0 39.8 38.3 31.2* 35.7  MCV 91.6 89.4 91.6 91.9 91.5  PLT 356 331 313 267 299    Cardiac Enzymes:  Recent Labs Lab 04/20/15 1952 04/21/15 0422 04/21/15 1242 04/21/15 1918  TROPONINI <0.03 <0.03 <0.03 0.03    BNP: Invalid input(s): POCBNP  CBG: No results for input(s): GLUCAP in the last 168 hours.  Microbiology: Results for orders placed or performed during the hospital encounter of 04/20/15  Culture, blood (routine x 2)     Status: None (Preliminary result)   Collection Time: 04/20/15  8:23 PM  Result Value Ref Range Status   Specimen Description BLOOD LEFT FATTY CASTS  Final   Special Requests   Final    BOTTLES DRAWN AEROBIC AND ANAEROBIC  2CCAERO, [place]  Culture NO GROWTH 2 DAYS  Final   Report Status PENDING  Incomplete  Culture, blood (routine x 2)     Status: None (Preliminary result)   Collection Time: 04/20/15  9:20 PM  Result Value Ref Range Status   Specimen Description BLOOD RIGHT ANTECUBITAL  Final   Special Requests BOTTLES DRAWN AEROBIC AND ANAEROBIC 5ML  Final   Culture NO GROWTH 2 DAYS  Final   Report Status PENDING   Incomplete    Coagulation Studies: No results for input(s): LABPROT, INR in the last 72 hours.  Urinalysis:  Recent Labs Lab 04/19/15 1435 04/21/15 0518  COLORURINE STRAW* YELLOW*  LABSPEC 1.001* 1.009  PHURINE 9.0* 6.0  GLUCOSEU NEGATIVE NEGATIVE  HGBUR NEGATIVE 2+*  BILIRUBINUR NEGATIVE NEGATIVE  KETONESUR NEGATIVE 1+*  PROTEINUR NEGATIVE NEGATIVE  NITRITE NEGATIVE NEGATIVE  LEUKOCYTESUR 1+* 2+*    Lipid Panel:    Component Value Date/Time   CHOL 123 04/21/2015 0422   TRIG 65 04/21/2015 0422   HDL 45 04/21/2015 0422   CHOLHDL 2.7 04/21/2015 0422   VLDL 13 04/21/2015 0422   LDLCALC 65 04/21/2015 0422    HgbA1C:  Lab Results  Component Value Date   HGBA1C 5.7 04/21/2015    Urine Drug Screen:     Component Value Date/Time   LABOPIA NEGATIVE 04/14/2014 1512   LABBENZ NEGATIVE 04/14/2014 1512   AMPHETMU NEGATIVE 04/14/2014 1512   THCU NEGATIVE 04/14/2014 1512   LABBARB NEGATIVE 04/14/2014 1512    Alcohol Level: No results for input(s): ETH in the last 168 hours.  Other results: EKG: sinus rhythm 56 bpm.  Imaging: Ct Head Wo Contrast  04/20/2015  CLINICAL DATA:  Weakness, left arm numbness and a aphasia. EXAM: CT HEAD WITHOUT CONTRAST TECHNIQUE: Contiguous axial images were obtained from the base of the skull through the vertex without intravenous contrast. COMPARISON:  03/04/2005 FINDINGS: Mild small vessel disease present. The brain demonstrates no evidence of hemorrhage, infarction, edema, mass effect, extra-axial fluid collection, hydrocephalus or mass lesion. The skull is unremarkable. IMPRESSION: No acute findings.  Mild small vessel disease. Electronically Signed   By: Aletta Edouard M.D.   On: 04/20/2015 20:52   Mr Brain Wo Contrast  04/21/2015  CLINICAL DATA:  TIA with left arm sensory deficit and transient aphasia. EXAM: MRI HEAD WITHOUT CONTRAST MRA HEAD WITHOUT CONTRAST TECHNIQUE: Multiplanar, multiecho pulse sequences of the brain and surrounding  structures were obtained without intravenous contrast. Angiographic images of the head were obtained using MRA technique without contrast. COMPARISON:  12/02/2013 neck MRI. FINDINGS: MRI HEAD FINDINGS Calvarium and upper cervical spine: No focal marrow signal abnormality. Orbits: Negative. Sinuses and Mastoids: Clear. Brain: No acute or remote infarct, hemorrhage, hydrocephalus, or mass lesion. No evidence of large vessel occlusion. Generalized atrophy, mild to moderate. Mild/age congruent small-vessel ischemic change in the cerebral white matter. MRA HEAD FINDINGS Motion degraded. Symmetric carotid and vertebral branching and size. Smooth and patent vertebral, basilar, and proximal posterior cerebral arteries as permitted by motion artifact. Symmetric carotid arteries. Carotid siphons are smooth and patent accounting for motion. Intact circle-of-Willis with small communicating arteries. No notable atherosclerotic irregularity, beading, major vessel occlusion, or flow limiting stenosis. Negative for aneurysm (left ACA branch point prominence shows no aneurysm on sagittal reformats-representative images sent to PACS. IMPRESSION: 1. No acute finding, including infarct.  Negative intracranial MRA. 2. Generalized atrophy. Electronically Signed   By: Monte Fantasia M.D.   On: 04/21/2015 12:14   US Carotid Bilateral  04/21/2015  CLINICAL DATA:  Hypertension, TIA symptoms, syncope, visual disturbance EXAM: BILATERAL CAROTID DUPLEX ULTRASOUND TECHNIQUE: Pearline Cables scale imaging, color Doppler and duplex ultrasound were performed of bilateral carotid and vertebral arteries in the neck. COMPARISON:  None. FINDINGS: Criteria: Quantification of carotid stenosis is based on velocity parameters that correlate the residual internal carotid diameter with NASCET-based stenosis levels, using the diameter of the distal internal carotid lumen as the denominator for stenosis measurement. The following velocity measurements were obtained:  RIGHT ICA:  111/36 cm/sec CCA:  123456 cm/sec SYSTOLIC ICA/CCA RATIO:  1.5 DIASTOLIC ICA/CCA RATIO:  2.2 ECA:  75 cm/sec LEFT ICA:  159/55 cm/sec CCA:  [id number] cm/sec SYSTOLIC ICA/CCA RATIO:  1.7 DIASTOLIC ICA/CCA RATIO:  2.5 ECA:  75 cm/sec RIGHT CAROTID ARTERY: Minor carotid intimal thickening without significant plaque formation. No hemodynamically significant right ICA stenosis, velocity elevation, or turbulent flow. Degree of narrowing less than 50%. RIGHT VERTEBRAL ARTERY:  Antegrade LEFT CAROTID ARTERY: Similar scattered minor intimal thickening without significant plaque formation. No hemodynamically significant left ICA stenosis, velocity elevation, or turbulent flow. LEFT VERTEBRAL ARTERY:  Antegrade IMPRESSION: Minor carotid intimal thickening. No significant atherosclerosis. No hemodynamically significant ICA stenosis. Patent antegrade vertebral flow bilaterally. Electronically Signed   By: Jerilynn Mages.  Shick M.D.   On: 04/21/2015 11:14   Dg Chest Port 1 View  04/20/2015  CLINICAL DATA:  Diarrhea and rectal bleeding, recently discharged from the hospital for same. EXAM: PORTABLE CHEST 1 VIEW COMPARISON:  06/03/2014 FINDINGS: A single AP portable view of the chest demonstrates no focal airspace consolidation or alveolar edema. The lungs are grossly clear. There is no large effusion or pneumothorax. Cardiac and mediastinal contours appear unremarkable. IMPRESSION: No active disease. Electronically Signed   By: Andreas Newport M.D.   On: 04/20/2015 21:05   Mr Lovenia Kim  04/21/2015  CLINICAL DATA:  TIA with left arm sensory deficit and transient aphasia. EXAM: MRI HEAD WITHOUT CONTRAST MRA HEAD WITHOUT CONTRAST TECHNIQUE: Multiplanar, multiecho pulse sequences of the brain and surrounding structures were obtained without intravenous contrast. Angiographic images of the head were obtained using MRA technique without contrast. COMPARISON:  12/02/2013 neck MRI. FINDINGS: MRI HEAD FINDINGS Calvarium and upper cervical  spine: No focal marrow signal abnormality. Orbits: Negative. Sinuses and Mastoids: Clear. Brain: No acute or remote infarct, hemorrhage, hydrocephalus, or mass lesion. No evidence of large vessel occlusion. Generalized atrophy, mild to moderate. Mild/age congruent small-vessel ischemic change in the cerebral white matter. MRA HEAD FINDINGS Motion degraded. Symmetric carotid and vertebral branching and size. Smooth and patent vertebral, basilar, and proximal posterior cerebral arteries as permitted by motion artifact. Symmetric carotid arteries. Carotid siphons are smooth and patent accounting for motion. Intact circle-of-Willis with small communicating arteries. No notable atherosclerotic irregularity, beading, major vessel occlusion, or flow limiting stenosis. Negative for aneurysm (left ACA branch point prominence shows no aneurysm on sagittal reformats-representative images sent to PACS. IMPRESSION: 1. No acute finding, including infarct.  Negative intracranial MRA. 2. Generalized atrophy. Electronically Signed   By: Monte Fantasia M.D.   On: 04/21/2015 12:14    Assessment: 65 y.o. female presenting with multiple GI complaints.  While in the ED had the onset of LUE numbness and aphasia.  Continues to complain of some upper extremity numbness in an unusual distribution for TIA/CVA.   Work up has included an MRI of the brain that has been personally reviewed and shows no acute changes.  MRA was unremarkable as well.  Carotid dopplers show no hemodynamically significant stenosis.  LDL 65.  A1c  5.7.    Stroke Risk Factors - hypertension  Plan: 1. PT consult, OT consult, Speech consult 2. Echocardiogram pending 3. Prophylactic therapy-Would start antiplatelet therapy once medically appropriate.  Patient with multiple GI issues that at this point would be a contraindication to antiplatelet therapy.   4. Telemetry monitoring 5. Frequent neuro checks 6. B1, B12, TSH  Alexis Goodell,  MD Neurology [phone] 04/22/2015, 10:48 AM

## 2015-04-22 NOTE — Evaluation (Signed)
Physical Therapy Evaluation Patient Details Name: Angela [name] MRN: [id number] DOB: 1950-02-17 Today's Date: 04/22/2015   History of Present Illness  Patient is a 65 y/o female that presents with abdominal pain, BRB per rectum, diahrrea. In the ED she began to run high BP, complained of aphasia and LUE weakness/numbness, which appears to have resolved CT/MRI negative. Notable history of bipolar disorder, demonstrates scattered train of thought in conversation.  Clinical Impression  Patient attempted to be seen yesterday by this author, though she was quite anxious about her BP. Today her BP is WNL, she agrees to limited participation as she is feeling sick/weak. She is able to transfer in bed mobility and out of bed slowly, though no loss of balance demonstrated. She takes prolonged time to complete ambulation, very slow gait speed noted indicative of falls risk. Patient does not appear to have a support system, and lives alone. Given her lack of assistance, and increased physical needs for assistance, would recommend short term rehab after discharge to address her mobility deficits.     Follow Up Recommendations SNF    Equipment Recommendations       Recommendations for Other Services       Precautions / Restrictions Precautions Precautions: Fall Restrictions Weight Bearing Restrictions: No      Mobility  Bed Mobility Overal bed mobility: Needs Assistance Bed Mobility: Supine to Sit     Supine to sit: Min guard        Transfers Overall transfer level: Needs assistance Equipment used: Rolling walker (2 wheeled) Transfers: Sit to/from Stand Sit to Stand: Min guard         General transfer comment: Patient requires excessive trunk flexion to complete transfer, prolonged time to complete.   Ambulation/Gait Ambulation/Gait assistance: Min guard Ambulation Distance (Feet): 20 Feet Assistive device: Rolling walker (2 wheeled) Gait Pattern/deviations: Step-through  pattern;Trunk flexed   Gait velocity interpretation: Below normal speed for age/gender General Gait Details: Patient ambulates at slow, reciprocal step through pattern with RW. Appears fatigued throughout, no buckling or loss of balance noted.   Stairs            Wheelchair Mobility    Modified Rankin (Stroke Patients Only)       Balance Overall balance assessment: Needs assistance Sitting-balance support: No upper extremity supported Sitting balance-Leahy Scale: Good     Standing balance support: Bilateral upper extremity supported Standing balance-Leahy Scale: Good                               Pertinent Vitals/Pain Pain Assessment: No/denies pain    Home Living Family/patient expects to be discharged to:: Private residence Living Arrangements: Alone   Type of Home: House Home Access: Stairs to enter Entrance Stairs-Rails:  (Appears she has rails) Entrance Stairs-Number of Steps: 3-5 she did not specify  Home Layout: Two level;Able to live on main level with bedroom/bathroom Home Equipment: Walker - 2 wheels      Prior Function Level of Independence: Independent         Comments: Patient is a poor historian, appears she has been independent, however she has had falls while out in the community.      Hand Dominance        Extremity/Trunk Assessment   Upper Extremity Assessment: Overall WFL for tasks assessed           Lower Extremity Assessment: Overall WFL for tasks assessed  Communication   Communication: No difficulties  Cognition Arousal/Alertness: Awake/alert Behavior During Therapy: WFL for tasks assessed/performed Overall Cognitive Status: Within Functional Limits for tasks assessed (Appears to have baseline disordered thoughts or scattered thoughts, she is in much more pleasant affect today compared to yesterday)                      General Comments      Exercises        Assessment/Plan     PT Assessment Patient needs continued PT services  PT Diagnosis Difficulty walking;Generalized weakness   PT Problem List Decreased strength;Decreased knowledge of use of DME;Decreased activity tolerance;Decreased balance;Decreased mobility  PT Treatment Interventions Gait training;DME instruction;Stair training;Therapeutic activities;Therapeutic exercise;Balance training   PT Goals (Current goals can be found in the Care Plan section) Acute Rehab PT Goals Patient Stated Goal: To get stronger at rehab  PT Goal Formulation: With patient Time For Goal Achievement: 05/06/15 Potential to Achieve Goals: Good    Frequency Min 2X/week   Barriers to discharge Decreased caregiver support Patient lives alone, does not appear to have support system.     Co-evaluation               End of Session Equipment Utilized During Treatment: Gait belt Activity Tolerance: Patient tolerated treatment well Patient left: in bed;with bed alarm set;with call bell/phone within reach Nurse Communication: Mobility status         Time: 0910-0930 PT Time Calculation (min) (ACUTE ONLY): 20 min   Charges:   PT Evaluation $PT Eval Moderate Complexity: 1 Procedure     PT G Codes:       Kerman Passey, PT, DPT    04/22/2015, 12:50 PM

## 2015-04-22 NOTE — Progress Notes (Addendum)
Initial Nutrition Assessment  DOCUMENTATION CODES:   Not applicable  INTERVENTION:   -Await diet progression as medically able. Per diet order, pt may have FL starting tomorrow 4/7 at 5am. -Recommend Boost Breeze po TID, each supplement provides 250 kcal and 9 grams of protein   NUTRITION DIAGNOSIS:   Inadequate oral intake related to acute illness as evidenced by per patient/family report.  GOAL:   Patient will meet greater than or equal to 90% of their needs  MONITOR:   PO intake, Supplement acceptance, Diet advancement, Labs, I & O's  REASON FOR ASSESSMENT:   Consult Poor PO  ASSESSMENT:   Pt admitted with rectal bleeding, diarrhea, secondray to likely ischemic colitis per MD note. Pt scheduled for colonoscopy 4/6, however d/c. Pt remains on isolation.  Past Medical History  Diagnosis Date  . Endometriosis   . Hypothyroidism   . GERD (gastroesophageal reflux disease)   . Bipolar 1 disorder ([place])   . Hypertension   . Hernia, hiatal   . Allergic rhinitis   . Bronchitis   . Endometriosis   . Otalgia   . Otitis media   . Chronic sinusitis   . skin cancer   . Malignant neoplasm of skin   . Facial pain   . Dizziness   . Blurred vision   . Neuropathy ([place])   . Thyroid disease   . Anemia   . Depression   . Migraine   . Chronic constipation   . Diverticulitis   . Diverticulosis   . GERD (gastroesophageal reflux disease)      Diet Order:  Diet clear liquid Room service appropriate?: Yes; Fluid consistency:: Thin Diet full liquid Room service appropriate?: Yes; Fluid consistency:: Thin   Current Nutrition: Pt ate 100% of broth from breakfast tray on visit. Pt delivered CL tray for lunch on visit. Pt reports tolerating CL and wanting more.   Food/Nutrition-Related History: Pt reports having eaten hardly anything for the past week. Pt reports prior to that having reduced appetite secondary to multiple friends passing away within the past few months. Pt  reports not drinking supplements as she does not tolerate lactose very well (of note, Boost/Ensure products are lactose free).  Scheduled Medications:  . amLODipine  2.5 mg Oral Daily  . ciprofloxacin  400 mg Intravenous Q12H  . divalproex  500 mg Oral TID  . feeding supplement  1 Container Oral TID BM  . levothyroxine  88 mcg Oral QAC breakfast  . metronidazole  500 mg Intravenous Q8H  . pantoprazole (PROTONIX) IV  40 mg Intravenous Q12H  . PARoxetine  30 mg Oral Daily  . sodium chloride flush  3 mL Intravenous Q12H    Continuous Medications:  . sodium chloride      Labs: reviewed  Gastrointestinal Profile: Last BM:  04/20/2015   Nutrition-Focused Physical Exam Findings:  Unable to complete Nutrition-Focused physical exam at this time as during visit, pt IV infiltrated, needing changing and lab work needing to be pulled, will attempt on follow.   Weight Change: Unable to clarify, per [facility] weight encounters pt weight relatively stable   Skin:  Reviewed, no issues   Height:   Ht Readings from Last 1 Encounters:  04/21/15 5' (1.524 m)    Weight:   Wt Readings from Last 1 Encounters:  04/21/15 121 lb 4.8 oz (55.021 kg)    BMI:  Body mass index is 23.69 kg/(m^2).  Estimated Nutritional Needs:   Kcal:  BEE: 1006kcals, TEE: (IF 1.1-1.3)(AF 1.3)  1440-1700kcals  Protein:  44-61g protein (1.0-1.2g/kg)  Fluid:  1.7-2L fluid  EDUCATION NEEDS:   No education needs identified at this time  Dwyane Luo, RD, LDN Pager [phone] Weekend/On-Call Pager [phone]

## 2015-04-22 NOTE — Evaluation (Signed)
Occupational Therapy Evaluation Patient Details Name: ALEXI ZASTOUPIL MRN: [id number] DOB: June 26, 1950 Today's Date: 04/22/2015    History of Present Illness Patient is a 65 y/o female that presents with abdominal pain, BRB per rectum, diahrrea. In the ED she began to run high BP, complained of aphasia and LUE weakness/numbness, which appears to have resolved CT/MRI negative. Notable history of bipolar disorder, demonstrates scattered train of thought in conversation.   Clinical Impression   Pt.. Is a 65 y.o. Female who was admitted with abdominal pain, and rectal bleeding. Pt. Has increased back pain, which is relieved by sitting up at the EOB. Nursing was notified of pt. Request for pain medicine, and request for sitting at the EOB with the alarm in place. Pt. Presents with weakness, and pain which hinder her ability to complete ADL and IADL tasks. Pt. Benefits from skilled OT services to review A/E to assist with LE ADLs.    Follow Up Recommendations    SNF    Equipment Recommendations       Recommendations for Other Services       Precautions / Restrictions Precautions Precautions: Fall Restrictions Weight Bearing Restrictions: No      Mobility Bed Mobility Overal bed mobility: Needs Assistance Bed Mobility: Supine to Sit     Supine to sit: Independent        Transfers         General transfer comment: Patient requires excessive trunk flexion to complete transfer, prolonged time to complete.     Balance Overall balance assessment: Needs assistance Sitting-balance support: No upper extremity supported Sitting balance-Leahy Scale: Good     Standing balance support: Bilateral upper extremity supported Standing balance-Leahy Scale: Good                              ADL Overall ADL's : Needs assistance/impaired Eating/Feeding: Independent   Grooming: Independent           Lowerr Body Dressing : Maximal assistance (secondary to history of  back pain)                           Vision Additional Comments: History of cataracts   Perception     Praxis      Pertinent Vitals/Pain Pain Assessment: 0-10 Pain Score: 6  Pain Location: Back     Hand Dominance Right   Extremity/Trunk Assessment Upper Extremity Assessment Upper Extremity Assessment: Overall WFL for tasks assessed   Lower Extremity Assessment Lower Extremity Assessment: Overall WFL for tasks assessed       Communication Communication Communication: No difficulties   Cognition Arousal/Alertness: Awake/alert Behavior During Therapy: WFL for tasks assessed/performed Overall Cognitive Status: Within Functional Limits for tasks assessed                     General Comments       Exercises       Shoulder Instructions      Home Living Family/patient expects to be discharged to:: Other (Comment) (Pt. reports residing in a boarding house with two oter individuals.) Living Arrangements: Alone Available Help at Discharge: Home health;Skilled Nursing Facility Type of Home: House Home Access: Stairs to enter [employer] of Steps: 3 Entrance Stairs-Rails:  (Appears she has rails) Home Layout: Two level;Able to live on main level with bedroom/bathroom     Bathroom Shower/Tub: Tub/shower unit;Curtain   Biochemist, clinical: Standard  Home Equipment: [employer] - 2 wheels          Prior Functioning/Environment Level of Independence: Independent        Comments: Pt. has a history of falls. was previously living at the [facility]  6 prior to moving into the boarding house.    OT Diagnosis:     OT Problem List:     OT Treatment/Interventions:      OT Goals(Current goals can be found in the care plan section) Acute Rehab OT Goals Patient Stated Goal: To be independent again. OT Goal Formulation: With patient Time For Goal Achievement: 05/06/15 Potential to Achieve Goals: Good  OT Frequency:     Barriers to D/C:             Co-evaluation              End of Session    Activity Tolerance:WFL for the activity performed   Patient left:  Bed with alarm in place, and call bell in place.   Time: 1510-1540 OT Time Calculation (min): 30 min Charges:  OT General Charges $OT Visit: 1 Procedure OT Evaluation $OT Eval Low Complexity: 1 Procedure G-Codes:    Harrel Carina, MS, OTR/L Harrel Carina 04/22/2015, 3:56 PM

## 2015-04-22 NOTE — Progress Notes (Signed)
Pt refused depacote, called md changed to liq, restasis eyedrops ordered.

## 2015-04-22 NOTE — Progress Notes (Signed)
Called Dr Vira Agar regarding pt's diet order. Per Dr Vira Agar to order clear liquid now and full liquid for breakfast tomorrow.

## 2015-04-22 NOTE — Telephone Encounter (Signed)
-----   Message from Rubie Maid, MD sent at 04/22/2015 12:30 PM EDT ----- Please inform patient that vulvar biopsy was normal.

## 2015-04-22 NOTE — Consult Note (Signed)
See note from [place], [place].  Patient a little more coherent today.  Less abd pain, no diarrhea, taking in clear liquids and wants more food.  Will advance to full liquids tomorrow.  Pt did not want to have flex sig or colonoscopy.  She is making progress, should remain on antibiotics.

## 2015-04-22 NOTE — Progress Notes (Signed)
[facility] [place] NAME: Angela [name]    MR#:  [id number]  DATE OF BIRTH:  30-Mar-1950  SUBJECTIVE:   Patient says she is not denied take the prep because she has not eaten in several days. She is hungry and wants to continue clear liquid diet. She has no diarrhea or abdominal pain. She is stating that she feels that she is bleeding from her labial biopsy  REVIEW OF SYSTEMS:    Review of Systems  Constitutional: Negative for fever, chills and malaise/fatigue.  HENT: Negative for ear discharge, ear pain, hearing loss, nosebleeds and sore throat.   Eyes: Negative for blurred vision and pain.  Respiratory: Negative for cough, hemoptysis, shortness of breath and wheezing.   Cardiovascular: Negative for chest pain, palpitations and leg swelling.  Gastrointestinal: Negative for nausea, vomiting, diarrhea, blood in stool and melena.  Genitourinary: Negative for dysuria.       Bleeding from labial biopsy  Musculoskeletal: Negative for back pain.  Neurological: Negative for dizziness, tremors, speech change, focal weakness, seizures and headaches.  Endo/Heme/Allergies: Does not bruise/bleed easily.  Psychiatric/Behavioral: Negative for depression, suicidal ideas and hallucinations.    Tolerating Diet:  Clear liquid diet    DRUG ALLERGIES:   Allergies  Allergen Reactions  . Bee Venom Hives  . Darvon [Propoxyphene] Anaphylaxis and Hives  . Peanuts [Peanut Oil] Anaphylaxis and Hives  . Penicillins Hives and Other (See Comments)    Has patient had a PCN reaction causing immediate rash, facial/tongue/throat swelling, SOB or lightheadedness with hypotension: Unsure  Has patient had a PCN reaction causing severe rash involving mucus membranes or skin necrosis: Unsure  Has patient had a PCN reaction that required hospitalization Unsure  Has patient had a PCN reaction occurring within the last 10 years: Unsure  If all of the above  answers are "NO", then may proceed with Cephalosporin use.  Marland Kitchen Percocet [Oxycodone-Acetaminophen] Nausea And Vomiting and Anxiety    Reaction:  Hallucinations   . Sulfa Antibiotics Anaphylaxis, Hives and Itching  . Sulfacetamide Sodium Anaphylaxis, Hives and Itching  . Adhesive [Tape] Hives  . Amikacin Nausea And Vomiting  . Aspirin Hives  . Doxycycline Photosensitivity  . Haloperidol Nausea And Vomiting  . Hymenoptera Venom Preparations Hives  . Ibuprofen Hives and Nausea And Vomiting  . Imipramine Hives  . Silicon Hives  . Ultram [Tramadol] Hives  . Epinephrine Palpitations  . Hydroxyzine Anxiety  . Morphine And Related Nausea And Vomiting, Anxiety and Other (See Comments)    Reaction:  Hallucinations   . Prednisone Nausea And Vomiting, Anxiety and Other (See Comments)    Reaction:  Strange thoughts     VITALS:  Blood pressure 139/83, pulse 70, temperature 97.9 F (36.6 C), temperature source Oral, resp. rate 20, height 5' (1.524 m), weight 55.021 kg (121 lb 4.8 oz), SpO2 100 %.  PHYSICAL EXAMINATION:   Physical Exam  Constitutional: She is oriented to person, place, and time and well-developed, well-nourished, and in no distress. No distress.  HENT:  Head: Normocephalic.  Eyes: No scleral icterus.  Neck: Normal range of motion. Neck supple. No JVD present. No tracheal deviation present.  Cardiovascular: Normal rate, regular rhythm and normal heart sounds.  Exam reveals no gallop and no friction rub.   No murmur heard. Pulmonary/Chest: Effort normal and breath sounds normal. No respiratory distress. She has no wheezes. She has no rales. She exhibits no tenderness.  Abdominal: Soft. Bowel sounds are normal. She  exhibits no distension and no mass. There is no tenderness. There is no rebound and no guarding.  Musculoskeletal: Normal range of motion. She exhibits no edema.  Neurological: She is alert and oriented to person, place, and time.  Skin: Skin is warm. No rash noted. No  erythema.  Psychiatric: Affect normal.  Anxious      LABORATORY PANEL:   CBC  Recent Labs Lab 04/22/15 0513  WBC 6.1  HGB 12.4  HCT 35.7  PLT 299   ------------------------------------------------------------------------------------------------------------------  Chemistries   Recent Labs Lab 04/20/15 1952 04/21/15 0422 04/22/15 0513  NA 134* 138 135  K 3.5 3.2* 3.5  CL 104 111 107  CO2 24 22 22   GLUCOSE 80 121* 90  BUN 5* <5* <5*  CREATININE 0.82 0.69 0.68  CALCIUM 8.9 7.8* 8.5*  MG  --  1.8  --   AST 31  --   --   ALT 25  --   --   ALKPHOS 74  --   --   BILITOT 0.3  --   --    ------------------------------------------------------------------------------------------------------------------  Cardiac Enzymes  Recent Labs Lab 04/21/15 0422 04/21/15 1242 04/21/15 1918  TROPONINI <0.03 <0.03 0.03   ------------------------------------------------------------------------------------------------------------------  RADIOLOGY:  Ct Head Wo Contrast  04/20/2015  CLINICAL DATA:  Weakness, left arm numbness and a aphasia. EXAM: CT HEAD WITHOUT CONTRAST TECHNIQUE: Contiguous axial images were obtained from the base of the skull through the vertex without intravenous contrast. COMPARISON:  03/04/2005 FINDINGS: Mild small vessel disease present. The brain demonstrates no evidence of hemorrhage, infarction, edema, mass effect, extra-axial fluid collection, hydrocephalus or mass lesion. The skull is unremarkable. IMPRESSION: No acute findings.  Mild small vessel disease. Electronically Signed   By: Aletta Edouard M.D.   On: 04/20/2015 20:52   Mr Brain Wo Contrast  04/21/2015  CLINICAL DATA:  TIA with left arm sensory deficit and transient aphasia. EXAM: MRI HEAD WITHOUT CONTRAST MRA HEAD WITHOUT CONTRAST TECHNIQUE: Multiplanar, multiecho pulse sequences of the brain and surrounding structures were obtained without intravenous contrast. Angiographic images of the head were  obtained using MRA technique without contrast. COMPARISON:  12/02/2013 neck MRI. FINDINGS: MRI HEAD FINDINGS Calvarium and upper cervical spine: No focal marrow signal abnormality. Orbits: Negative. Sinuses and Mastoids: Clear. Brain: No acute or remote infarct, hemorrhage, hydrocephalus, or mass lesion. No evidence of large vessel occlusion. Generalized atrophy, mild to moderate. Mild/age congruent small-vessel ischemic change in the cerebral white matter. MRA HEAD FINDINGS Motion degraded. Symmetric carotid and vertebral branching and size. Smooth and patent vertebral, basilar, and proximal posterior cerebral arteries as permitted by motion artifact. Symmetric carotid arteries. Carotid siphons are smooth and patent accounting for motion. Intact circle-of-Willis with small communicating arteries. No notable atherosclerotic irregularity, beading, major vessel occlusion, or flow limiting stenosis. Negative for aneurysm (left ACA branch point prominence shows no aneurysm on sagittal reformats-representative images sent to PACS. IMPRESSION: 1. No acute finding, including infarct.  Negative intracranial MRA. 2. Generalized atrophy. Electronically Signed   By: Monte Fantasia M.D.   On: 04/21/2015 12:14   US Carotid Bilateral  04/21/2015  CLINICAL DATA:  Hypertension, TIA symptoms, syncope, visual disturbance EXAM: BILATERAL CAROTID DUPLEX ULTRASOUND TECHNIQUE: Pearline Cables scale imaging, color Doppler and duplex ultrasound were performed of bilateral carotid and vertebral arteries in the neck. COMPARISON:  None. FINDINGS: Criteria: Quantification of carotid stenosis is based on velocity parameters that correlate the residual internal carotid diameter with NASCET-based stenosis levels, using the diameter of the distal internal carotid  lumen as the denominator for stenosis measurement. The following velocity measurements were obtained: RIGHT ICA:  111/36 cm/sec CCA:  123456 cm/sec SYSTOLIC ICA/CCA RATIO:  1.5 DIASTOLIC ICA/CCA  RATIO:  2.2 ECA:  75 cm/sec LEFT ICA:  159/55 cm/sec CCA:  123XX123 cm/sec SYSTOLIC ICA/CCA RATIO:  1.7 DIASTOLIC ICA/CCA RATIO:  2.5 ECA:  75 cm/sec RIGHT CAROTID ARTERY: Minor carotid intimal thickening without significant plaque formation. No hemodynamically significant right ICA stenosis, velocity elevation, or turbulent flow. Degree of narrowing less than 50%. RIGHT VERTEBRAL ARTERY:  Antegrade LEFT CAROTID ARTERY: Similar scattered minor intimal thickening without significant plaque formation. No hemodynamically significant left ICA stenosis, velocity elevation, or turbulent flow. LEFT VERTEBRAL ARTERY:  Antegrade IMPRESSION: Minor carotid intimal thickening. No significant atherosclerosis. No hemodynamically significant ICA stenosis. Patent antegrade vertebral flow bilaterally. Electronically Signed   By: Jerilynn Mages.  Shick M.D.   On: 04/21/2015 11:14   Dg Chest Port 1 View  04/20/2015  CLINICAL DATA:  Diarrhea and rectal bleeding, recently discharged from the hospital for same. EXAM: PORTABLE CHEST 1 VIEW COMPARISON:  06/03/2014 FINDINGS: A single AP portable view of the chest demonstrates no focal airspace consolidation or alveolar edema. The lungs are grossly clear. There is no large effusion or pneumothorax. Cardiac and mediastinal contours appear unremarkable. IMPRESSION: No active disease. Electronically Signed   By: Andreas Newport M.D.   On: 04/20/2015 21:05   Mr Lovenia Kim  04/21/2015  CLINICAL DATA:  TIA with left arm sensory deficit and transient aphasia. EXAM: MRI HEAD WITHOUT CONTRAST MRA HEAD WITHOUT CONTRAST TECHNIQUE: Multiplanar, multiecho pulse sequences of the brain and surrounding structures were obtained without intravenous contrast. Angiographic images of the head were obtained using MRA technique without contrast. COMPARISON:  12/02/2013 neck MRI. FINDINGS: MRI HEAD FINDINGS Calvarium and upper cervical spine: No focal marrow signal abnormality. Orbits: Negative. Sinuses and Mastoids: Clear.  Brain: No acute or remote infarct, hemorrhage, hydrocephalus, or mass lesion. No evidence of large vessel occlusion. Generalized atrophy, mild to moderate. Mild/age congruent small-vessel ischemic change in the cerebral white matter. MRA HEAD FINDINGS Motion degraded. Symmetric carotid and vertebral branching and size. Smooth and patent vertebral, basilar, and proximal posterior cerebral arteries as permitted by motion artifact. Symmetric carotid arteries. Carotid siphons are smooth and patent accounting for motion. Intact circle-of-Willis with small communicating arteries. No notable atherosclerotic irregularity, beading, major vessel occlusion, or flow limiting stenosis. Negative for aneurysm (left ACA branch point prominence shows no aneurysm on sagittal reformats-representative images sent to PACS. IMPRESSION: 1. No acute finding, including infarct.  Negative intracranial MRA. 2. Generalized atrophy. Electronically Signed   By: Monte Fantasia M.D.   On: 04/21/2015 12:14     ASSESSMENT AND PLAN:   65 year old female with essential hypertension who presents with acute colitis.  1. Acute colitis seen on CT scan on April 3, failed outpatient treatment: Continue Flagyl and ciprofloxacin. Colitis is likely ischemic in nature. Plan was for colonoscopy however patient does not want colonoscopy at this time.   2. Melena: Hemoglobin remains stable. Continue PPI. Appreciate GI consult  3. Essential hypertension: Continue Norvasc Blood pressure is acceptable.   4. Hypothyroid: Continue Synthroid.    5. Bipolar: Continue Depakote and Paxil.   6. Hypokalemia: Resolved after repletion.  7. Numbness with transient aphasia: MRI/MRA is negative for stroke. Carotid ultrasound shows no hemodynamically significant stenosis. I am not suspecting TIA. Appreciate neurology consultation.  8. Labial bleeding: Consult GYN.   Management plans discussed with the patient and she is in  agreement.  CODE  STATUS: FULL  TOTAL TIME TAKING CARE OF THIS PATIENT: 28 minutes.     POSSIBLE D/C 1-2 days, DEPENDING ON CLINICAL CONDITION.   Lilliane Sposito M.D on 04/22/2015 at 11:27 AM  Between 7am to 6pm - Pager - [phone] After 6pm go to [email or web address] - password [email or web address] [facility]  Office  [phone]  CC: Primary care physician; [place] Pink, MD  Note: This dictation was prepared with Dragon dictation along with smaller phrase technology. Any transcriptional errors that result from this process are unintentional.

## 2015-04-22 NOTE — Progress Notes (Signed)
*  PRELIMINARY RESULTS* Echocardiogram 2D Echocardiogram has been performed.  Angela [name] 04/22/2015, 10:05 AM

## 2015-04-22 NOTE — Telephone Encounter (Signed)
Called pt to inform her of normal biopsy results, pt informed, however pt notes that she is currently admitted to the hospital and was told that Dr.Cherry would be coming over to consult with her. Pt states that she has still not heard anything or seen Dr.Cherry. Advised pt that I would speak with Dr.Cherry about this immediately.

## 2015-04-22 NOTE — Plan of Care (Signed)
Problem: Nutrition: Goal: Adequate nutrition will be maintained Outcome: Progressing Poor appetite. Pt encouraged to eat/drink- currently on clear liquid. Feeding supplement initiated. Zofran given for nausea once with improvement.     Problem: Bowel/Gastric: Goal: Will not experience complications related to bowel motility Outcome: Progressing No BM since admission. Order for C diff collection d/c. Enteric precautions continues due to GI panel pending. Pt refuses colonoscopy today.

## 2015-04-22 NOTE — Consult Note (Signed)
GI Inpatient Follow-up Note  Patient Identification: Angela [name] is a 65 y.o. female with possible ischemic colitis  Subjective: Patient refused to drink her prep for her colonoscopy that was scheduled for today.  She said she took two sips and immediately became nauseas. Denies vomiting. She reports she cannot tolerate any type of citrus and the Gatorade was lemon.  She reports she was told she could have an enema prior to the procedure.  She then refused the procedure, stating she is too weak to have it completed.  She reports that she has not had any further BM's since admission.  She did use the commode and saw blood, but she believes that it is coming from her bladder, she did not see any blood on the tissue when she wiped her rectum.  Reports she is still experiencing generalized abdominal pain.   She had just finished a tray of clear liquids prior to my visit.  Scheduled Inpatient Medications:  . amLODipine  2.5 mg Oral Daily  . ciprofloxacin  400 mg Intravenous Q12H  . divalproex  500 mg Oral TID  . levothyroxine  88 mcg Oral QAC breakfast  . metronidazole  500 mg Intravenous Q8H  . pantoprazole (PROTONIX) IV  40 mg Intravenous Q12H  . PARoxetine  30 mg Oral Daily  . sodium chloride flush  3 mL Intravenous Q12H    Continuous Inpatient Infusions:   . sodium chloride      PRN Inpatient Medications:  acetaminophen **OR** acetaminophen, HYDROcodone-acetaminophen, ondansetron **OR** ondansetron (ZOFRAN) IV  Review of Systems: Constitutional: Weight is stable.  Eyes: No changes in vision. ENT: No oral lesions, sore throat.  GI: see HPI.  Heme/Lymph: No easy bruising.  CV: No chest pain.  GU: No hematuria.  Integumentary: No rashes.  Neuro: No headaches.  Psych: No depression/anxiety.  Endocrine: No heat/cold intolerance.  Allergic/Immunologic: No urticaria.  Resp: No cough, SOB.  Musculoskeletal: No joint swelling.    Physical Examination: BP 139/83 mmHg   Pulse 70  Temp(Src) 97.9 F (36.6 C) (Oral)  Resp 20  Ht 5' (1.524 m)  Wt 55.021 kg (121 lb 4.8 oz)  BMI 23.69 kg/m2  SpO2 100% Gen: NAD, alert and oriented x 4, she is a very scattered historian, hard to keep on track. HEENT: PEERLA, EOMI, Neck: supple, no JVD or thyromegaly Chest: CTA bilaterally, no wheezes, crackles, or other adventitious sounds CV: RRR, no m/g/c/r Abd: soft, generalized tenderness, ND, +BS in all four quadrants; no HSM, guarding, ridigity, or rebound tenderness Ext: no edema, well perfused with 2+ pulses, Skin: no rash or lesions noted Lymph: no LAD  Data: Lab Results  Component Value Date   WBC 6.1 04/22/2015   HGB 12.4 04/22/2015   HCT 35.7 04/22/2015   MCV 91.5 04/22/2015   PLT 299 04/22/2015    Recent Labs Lab 04/20/15 1952 04/21/15 0422 04/22/15 0513  HGB 12.9 10.8* 12.4   Lab Results  Component Value Date   NA 135 04/22/2015   K 3.5 04/22/2015   CL 107 04/22/2015   CO2 22 04/22/2015   BUN <5* 04/22/2015   CREATININE 0.68 04/22/2015   Lab Results  Component Value Date   ALT 25 04/20/2015   AST 31 04/20/2015   ALKPHOS 74 04/20/2015   BILITOT 0.3 04/20/2015   No results for input(s): APTT, INR, PTT in the last 168 hours.  Assessment/Plan: Ms. Wilhoit is a 65 y.o. female with possible ischemic colitis.  Colonoscopy was planned for this afternoon,  patient is refusing prep and procedure. Hgb improved 12.4, no further reported rectal bleeding. WBC improved 6.1  Recommendations: No new GI recommendations at this time.    We will continue to follow with you. Please call with questions or concerns.  Salvadore Farber, PA-C  I personally performed these services.

## 2015-04-23 DIAGNOSIS — K529 Noninfective gastroenteritis and colitis, unspecified: Secondary | ICD-10-CM

## 2015-04-23 LAB — BASIC METABOLIC PANEL
Anion gap: 9 (ref 5–15)
CALCIUM: 8.1 mg/dL — AB (ref 8.9–10.3)
CHLORIDE: 103 mmol/L (ref 101–111)
CO2: 17 mmol/L — AB (ref 22–32)
CREATININE: 0.81 mg/dL (ref 0.44–1.00)
GFR calc non Af Amer: >60 mL/min (ref >60)
GLUCOSE: 156 mg/dL — AB (ref 65–99)
Potassium: 2.8 mmol/L — CL (ref 3.5–5.1)
Sodium: 129 mmol/L — ABNORMAL LOW (ref 135–145)

## 2015-04-23 LAB — CBC
HEMATOCRIT: 35.4 % (ref 35.0–47.0)
Hemoglobin: 12.4 g/dL (ref 12.0–16.0)
MCH: 31.2 pg (ref 26.0–34.0)
MCHC: 35 g/dL (ref 32.0–36.0)
MCV: 89 fL (ref 80.0–100.0)
PLATELETS: 325 10*3/uL (ref 150–440)
RBC: 3.98 MIL/uL (ref 3.80–5.20)
RDW: 13.1 % (ref 11.5–14.5)
WBC: 6.4 10*3/uL (ref 3.6–11.0)

## 2015-04-23 LAB — PATHOLOGY

## 2015-04-23 LAB — MAGNESIUM
Magnesium: 1.8 mg/dL (ref 1.7–2.4)
Magnesium: 1.6 mg/dL — ABNORMAL LOW (ref 1.7–2.4)

## 2015-04-23 LAB — POTASSIUM: Potassium: 3.5 mmol/L (ref 3.5–5.1)

## 2015-04-23 MED ORDER — PANTOPRAZOLE SODIUM 40 MG PO TBEC
40.0000 mg | DELAYED_RELEASE_TABLET | Freq: Two times a day (BID) | ORAL | Status: DC
  Administered 2015-04-24 – 2015-04-26 (×6): 40 mg via ORAL
  Filled 2015-04-23 (×7): qty 1

## 2015-04-23 MED ORDER — LORAZEPAM 1 MG PO TABS
1.0000 mg | ORAL_TABLET | Freq: Four times a day (QID) | ORAL | Status: DC | PRN
  Administered 2015-04-23 – 2015-04-26 (×4): 1 mg via ORAL
  Filled 2015-04-23 (×5): qty 1

## 2015-04-23 MED ORDER — DIVALPROEX SODIUM 125 MG PO CSDR
250.0000 mg | DELAYED_RELEASE_CAPSULE | Freq: Three times a day (TID) | ORAL | Status: DC
  Administered 2015-04-23 – 2015-04-26 (×7): 250 mg via ORAL
  Filled 2015-04-23 (×12): qty 2

## 2015-04-23 MED ORDER — POTASSIUM CHLORIDE 10 MEQ/100ML IV SOLN
10.0000 meq | INTRAVENOUS | Status: AC
  Administered 2015-04-23 (×2): 10 meq via INTRAVENOUS
  Filled 2015-04-23 (×4): qty 100

## 2015-04-23 MED ORDER — ALUM & MAG HYDROXIDE-SIMETH 200-200-20 MG/5ML PO SUSP
30.0000 mL | Freq: Four times a day (QID) | ORAL | Status: DC | PRN
  Administered 2015-04-23 – 2015-04-25 (×3): 30 mL via ORAL
  Filled 2015-04-23 (×3): qty 30

## 2015-04-23 MED ORDER — MAGNESIUM HYDROXIDE 400 MG/5ML PO SUSP
30.0000 mL | Freq: Two times a day (BID) | ORAL | Status: AC
  Filled 2015-04-23: qty 30

## 2015-04-23 MED ORDER — POTASSIUM CHLORIDE CRYS ER 20 MEQ PO TBCR
40.0000 meq | EXTENDED_RELEASE_TABLET | ORAL | Status: DC | PRN
  Administered 2015-04-23: 12:00:00 40 meq via ORAL
  Filled 2015-04-23: qty 2

## 2015-04-23 MED ORDER — SODIUM CHLORIDE 0.9 % IV SOLN
INTRAVENOUS | Status: DC
  Administered 2015-04-23: 13:00:00 via INTRAVENOUS

## 2015-04-23 MED ORDER — MAGNESIUM SULFATE 2 GM/50ML IV SOLN
2.0000 g | Freq: Once | INTRAVENOUS | Status: AC
  Administered 2015-04-23: 2 g via INTRAVENOUS
  Filled 2015-04-23: qty 50

## 2015-04-23 MED ORDER — MEDROXYPROGESTERONE ACETATE 10 MG PO TABS
10.0000 mg | ORAL_TABLET | Freq: Every day | ORAL | Status: DC
  Administered 2015-04-24 – 2015-04-26 (×3): 10 mg via ORAL
  Filled 2015-04-23 (×3): qty 1

## 2015-04-23 NOTE — Consult Note (Signed)
GYNECOLOGY INPATIENT DAILY CONSULT NOTE  Subjective: Patient reports nausea.  States that she has had a very difficult time swallowing due to burning in her chest and pain in abdomen with PO intake.  Still notes mostly left-sided abdominal pain, intermittent, radiating across abdomen.  Also still reporting bleeding, thinks that it was mostly vaginal.   States that she is now having numbness and tingling from knees to feet bilaterally.    Patient Active Problem List   Diagnosis Date Noted  . Colitis, acute 04/20/2015  . Melena 04/20/2015  . Accelerated hypertension 04/20/2015  . TIA (transient ischemic attack) 04/20/2015  . Hypothyroidism 04/20/2015  . GERD (gastroesophageal reflux disease) 04/20/2015  . Chest pain 04/20/2015  . Acute colitis 04/20/2015  . Pelvic pain in female 10/30/2014    Objective: I have reviewed patient's vital signs, intake and output, medications and labs. Filed Vitals:   04/22/15 0808 04/22/15 1323 04/22/15 2058 04/23/15 0517  BP: 139/83 168/93 148/91 108/76  Pulse: 70 63 74 85  Temp:  98.3 F (36.8 C) 98.7 F (37.1 C) 98.6 F (37 C)  TempSrc:  Oral Oral Oral  Resp: 20 18 18    Height:      Weight:      SpO2: 100%  100% 98%    General: alert and mild distress Resp: clear to auscultation bilaterally Cardio: regular rate and rhythm, S1, S2 normal, no murmur, click, rub or gallop GI: normal findings: soft, non-distended and abnormal findings:  mild tenderness in the LLQ Extremities: extremities normal, atraumatic, no cyanosis or edema Vaginal Bleeding: minimal  Labs:   CBC Latest Ref Rng 04/23/2015 04/22/2015 04/21/2015  WBC 3.6 - 11.0 K/uL 6.4 6.1 8.9  Hemoglobin 12.0 - 16.0 g/dL 12.4 12.4 10.8(L)  Hematocrit 35.0 - 47.0 % 35.4 35.7 31.2(L)  Platelets 150 - 440 K/uL 325 299 267      Assessment/Plan: 1) Post-menopausal bleeding.  Likely multi-factorial (inflammatory process with colitis, abnormal thyroid studies), however patient also with a past  history of episodic PMB prior to current admission.  Patient notes that she has had several more episodes of heavy bleeding, however on discussion with nurse, bleeding is described more as minimal.  Discussed trial of Provera tablets to help with bleeding.  Patient desires to be started on a "low dose as she does not think she would be able to tolerate a higher dose right now".  Will start on 10 mg Provera daily.  Can continue for 7 days. Still plans to undergo Hysteroscopy D&C as outpatient for further assessment/management of intermittent PMB.   Will continue to follow.    LOS: 3 days    Rubie Maid 04/23/2015, 10:21 AM

## 2015-04-23 NOTE — Progress Notes (Signed)
OT Cancellation Note  Patient Details Name: Angela [name] MRN: [id number] DOB: [birth year]   Cancelled Treatment:    Reason Eval/Treat Not Completed: Medical issues which prohibited therapy: Pt. With low Potassium at 2.8 today. OT treatment is contraindicated at this time. Will continue to monitor. Harrel Carina, MS, OTR/L    Harrel Carina 04/23/2015, 3:18 PM

## 2015-04-23 NOTE — Progress Notes (Signed)
Pharmacy Consult for electrolyte monitoring/replacment    Recent Labs  04/20/15 1952 04/21/15 0422 04/22/15 0513 04/23/15 0518 04/23/15 1049  NA 134* 138 135  --  129*  K 3.5 3.2* 3.5  --  2.8*  CL 104 111 107  --  103  CO2 24 22 22   --  17*  GLUCOSE 80 121* 90  --  156*  BUN 5* <5* <5*  --  <5*  CREATININE 0.82 0.69 0.68  --  0.81  CALCIUM 8.9 7.8* 8.5*  --  8.1*  MG  --  1.8  --  1.8 1.6*  PROT 7.3  --   --   --   --   ALBUMIN 3.8  --   --   --   --   AST 31  --   --   --   --   ALT 25  --   --   --   --   ALKPHOS 74  --   --   --   --   BILITOT 0.3  --   --   --   --   TRIG  --  65  --   --   --   CHOLHDL  --  2.7  --   --   --   CHOL  --  123  --   --   --    Estimated Creatinine Clearance: 52.5 mL/min (by C-G formula based on Cr of 0.81).   No results for input(s): GLUCAP in the last 72 hours.  Assessment: Patient with orders for potassium 69mEq PO and 39mEq IV as well as magnesium 2gm IV  Plan:  Will recheck potassium this evening after completion of IV runs, follow up electrolytes tomorrow morning  Abagale Boulos C 04/23/2015,12:46 PM

## 2015-04-23 NOTE — Progress Notes (Signed)
Nurse called me to tell me that patient continues to refuse most of her medications including potassium, depakote, Flagyl.   Patient is agreeable to oral flagyl but not depakote or potassium

## 2015-04-23 NOTE — Consult Note (Signed)
Patient difficult to care for since she is refusing different meds or iv.  Abd is benign on exam when I push with stethoscope.  Will get repeat labs tomorrow including flat and upright.  Consider advancing diet to low residue tomorrow.   May need short term placement.

## 2015-04-23 NOTE — Progress Notes (Addendum)
[facility] [place] NAME: Francisquita Lysne    MR#:  [id number]  DATE OF BIRTH:  Sep 12, 1950  SUBJECTIVE:   Patient complaining of nausea and abdominal pain. She would like  maalox for heartburn.  She is tolerating clear liquid diet REVIEW OF SYSTEMS:    Review of Systems  Constitutional: Positive for malaise/fatigue. Negative for fever and chills.  HENT: Negative for ear discharge, ear pain, hearing loss, nosebleeds and sore throat.   Eyes: Negative for blurred vision and pain.  Respiratory: Negative for cough, hemoptysis, shortness of breath and wheezing.   Cardiovascular: Negative for chest pain, palpitations and leg swelling.  Gastrointestinal: Positive for heartburn and abdominal pain. Negative for nausea, vomiting, diarrhea, constipation, blood in stool and melena.  Genitourinary: Negative for dysuria.  Musculoskeletal: Negative for back pain and joint pain.  Neurological: Positive for weakness. Negative for dizziness, tremors, speech change, focal weakness, seizures and headaches.  Endo/Heme/Allergies: Does not bruise/bleed easily.  Psychiatric/Behavioral: Negative for depression, suicidal ideas and hallucinations. The patient is nervous/anxious.     Tolerating Diet:  Clear liquid diet    DRUG ALLERGIES:   Allergies  Allergen Reactions  . Bee Venom Hives  . Darvon [Propoxyphene] Anaphylaxis and Hives  . Peanuts [Peanut Oil] Anaphylaxis and Hives  . Penicillins Hives and Other (See Comments)    Has patient had a PCN reaction causing immediate rash, facial/tongue/throat swelling, SOB or lightheadedness with hypotension: Unsure  Has patient had a PCN reaction causing severe rash involving mucus membranes or skin necrosis: Unsure  Has patient had a PCN reaction that required hospitalization Unsure  Has patient had a PCN reaction occurring within the last 10 years: Unsure  If all of the above answers are "NO", then may  proceed with Cephalosporin use.  Marland Kitchen Percocet [Oxycodone-Acetaminophen] Nausea And Vomiting and Anxiety    Reaction:  Hallucinations   . Sulfa Antibiotics Anaphylaxis, Hives and Itching  . Sulfacetamide Sodium Anaphylaxis, Hives and Itching  . Adhesive [Tape] Hives  . Amikacin Nausea And Vomiting  . Aspirin Hives  . Doxycycline Photosensitivity  . Haloperidol Nausea And Vomiting  . Hymenoptera Venom Preparations Hives  . Ibuprofen Hives and Nausea And Vomiting  . Imipramine Hives  . Silicon Hives  . Ultram [Tramadol] Hives  . Epinephrine Palpitations  . Hydroxyzine Anxiety  . Morphine And Related Nausea And Vomiting, Anxiety and Other (See Comments)    Reaction:  Hallucinations   . Prednisone Nausea And Vomiting, Anxiety and Other (See Comments)    Reaction:  Strange thoughts     VITALS:  Blood pressure 134/81, pulse 85, temperature 98.6 F (37 C), temperature source Oral, resp. rate 18, height 5' (1.524 m), weight 55.021 kg (121 lb 4.8 oz), SpO2 98 %.  PHYSICAL EXAMINATION:   Physical Exam  Constitutional: She is oriented to person, place, and time and well-developed, well-nourished, and in no distress. No distress.  HENT:  Head: Normocephalic.  Eyes: No scleral icterus.  Neck: Normal range of motion. Neck supple. No JVD present. No tracheal deviation present.  Cardiovascular: Normal rate, regular rhythm and normal heart sounds.  Exam reveals no gallop and no friction rub.   No murmur heard. Pulmonary/Chest: Effort normal and breath sounds normal. No respiratory distress. She has no wheezes. She has no rales. She exhibits no tenderness.  Abdominal: Soft. Bowel sounds are normal. She exhibits no distension and no mass. There is no tenderness. There is no rebound and no guarding.  Musculoskeletal: Normal range of motion. She exhibits no edema.  Neurological: She is alert and oriented to person, place, and time.  Skin: Skin is warm. No rash noted. No erythema.  Psychiatric:  Affect normal.  Anxious      LABORATORY PANEL:   CBC  Recent Labs Lab 04/23/15 0518  WBC 6.4  HGB 12.4  HCT 35.4  PLT 325   ------------------------------------------------------------------------------------------------------------------  Chemistries   Recent Labs Lab 04/20/15 1952  04/23/15 1049  NA 134*  < > 129*  K 3.5  < > 2.8*  CL 104  < > 103  CO2 24  < > 17*  GLUCOSE 80  < > 156*  BUN 5*  < > <5*  CREATININE 0.82  < > 0.81  CALCIUM 8.9  < > 8.1*  MG  --   < > 1.6*  AST 31  --   --   ALT 25  --   --   ALKPHOS 74  --   --   BILITOT 0.3  --   --   < > = values in this interval not displayed. ------------------------------------------------------------------------------------------------------------------  Cardiac Enzymes  Recent Labs Lab 04/21/15 0422 04/21/15 1242 04/21/15 1918  TROPONINI <0.03 <0.03 0.03   ------------------------------------------------------------------------------------------------------------------  RADIOLOGY:  No results found.   ASSESSMENT AND PLAN:   65 year old female with essential hypertension who presents with acute colitis and melena.  1. Acute colitis seen on CT scan on April 3, failed outpatient treatment: Continue Flagyl and ciprofloxacin. Colitis is likely ischemic in nature. Plan was for colonoscopy however patient does not want colonoscopy at this time. Appreciate GI consult  2. Melena: Hemoglobin remains stable. Continue PPI. Appreciate GI consult, no plans for intervention at this time.  3. Essential hypertension: Continue Norvasc Blood pressure is acceptable.   4. Hypothyroid: TSH was elevated and therefore Synthroid was increased last night she 100 mg daily. She needs repeat TFTs in 4-6 weeks.    5. Bipolar: Continue Depakote and Paxil.  She would like psychiatry consultation with Dr. Weber Cooks.  6. Hypokalemia: Resolved after repletion.  7. Numbness with transient aphasia: MRI/MRA is negative  for stroke. Carotid ultrasound shows no hemodynamically significant stenosis. This is not TIA or CVA.  Neurology is recommending prophylactic antiplatelet therapy once medically appropriate.  At this time patient has multiple GI issues and would not be a good candidate for antiplatelet therapy.   Appreciate neurology consultation.  8. Postmenopausal bleeding: This is likely multifactorial as per GYN consult. Continue Provera 10 mg daily for 7 days. She will have outpatient follow-up with GYN to undergo Hysteroscopy D&C   9. Hypokalemia and magnesium: Replete and recheck in a.m. See consult placed for electrolyte abnormalities.  Management plans discussed with the patient and she is in agreement.  CODE STATUS: FULL  TOTAL TIME TAKING CARE OF THIS PATIENT: 24 minutes.   Physical therapy is recommending skilled nursing facility at discharge which will likely be tomorrow.  POSSIBLE D/C tomorrow DEPENDING ON CLINICAL CONDITION.   Prudencio Velazco M.D on 04/23/2015 at 12:19 PM  Between 7am to 6pm - Pager - [phone] After 6pm go to [email or web address] - password [email or web address]  Office  [phone]  CC: Primary care physician; [place] Pink, MD  Note: This dictation was prepared with Dragon dictation along with smaller phrase technology. Any transcriptional errors that result from this process are unintentional.

## 2015-04-23 NOTE — Progress Notes (Addendum)
Subjective: Numbness on left arm has resolved.  Continues to have numbness on both feet that is more chronic.  Complains of back pain.    Objective: Current vital signs: BP 108/76 mmHg  Pulse 85  Temp(Src) 98.6 F (37 C) (Oral)  Resp 18  Ht 5' (1.524 m)  Wt 55.021 kg (121 lb 4.8 oz)  BMI 23.69 kg/m2  SpO2 98% Vital signs in last 24 hours: Temp:  [98.3 F (36.8 C)-98.7 F (37.1 C)] 98.6 F (37 C) (04/07 0517) Pulse Rate:  [63-85] 85 (04/07 0517) Resp:  [18] 18 (04/06 2058) BP: (108-168)/(76-93) 108/76 mmHg (04/07 0517) SpO2:  [98 %-100 %] 98 % (04/07 0517)  Intake/Output from previous day: 04/06 0701 - 04/07 0700 In: -  Out: 790 [Urine:790] Intake/Output this shift:   Nutritional status: Diet full liquid Room service appropriate?: Yes; Fluid consistency:: Thin  Neurologic Exam: Mental Status: Alert, oriented, thought content appropriate. Speech fluent without evidence of aphasia. Easily distracted. Tangential. Able to follow 3 step commands without difficulty. Cranial Nerves: II: Discs flat bilaterally; Visual fields grossly normal, pupils equal, round, reactive to light and accommodation III,IV, VI: ptosis not present, extra-ocular motions intact bilaterally V,VII: smile symmetric, facial light touch sensation normal bilaterally VIII: hearing normal bilaterally IX,X: gag reflex present XI: bilateral shoulder shrug XII: midline tongue extension Motor: Patient able to lift both upper extremities and maintain against gravity but unable to resist pressure of any type. 5/5 in BLE's  Sensory: Light touch and pinprick intact in the upper extremities   Lab Results: Basic Metabolic Panel:  Recent Labs Lab 04/16/15 2328 04/19/15 1340 04/20/15 1952 04/21/15 0422 04/22/15 0513 04/23/15 0518  NA 131* 126* 134* 138 135  --   K 3.7 3.3* 3.5 3.2* 3.5  --   CL 100* 93* 104 111 107  --   CO2 24 21* 24 22 22   --   GLUCOSE 88 97 80 121* 90  --   BUN 15 8 5* <5* <5*   --   CREATININE 0.56 1.01* 0.82 0.69 0.68  --   CALCIUM 8.9 9.1 8.9 7.8* 8.5*  --   MG  --   --   --  1.8  --  1.8    Liver Function Tests:  Recent Labs Lab 04/16/15 2328 04/19/15 1340 04/20/15 1952  AST 22 59* 31  ALT 15 37 25  ALKPHOS 82 79 74  BILITOT 0.3 0.8 0.3  PROT 7.3 7.4 7.3  ALBUMIN 4.1 4.0 3.8    Recent Labs Lab 04/16/15 2328 04/19/15 1340 04/20/15 1952  LIPASE 34 18 16   No results for input(s): AMMONIA in the last 168 hours.  CBC:  Recent Labs Lab 04/19/15 1340 04/20/15 1952 04/21/15 0422 04/22/15 0513 04/23/15 0518  WBC 14.5* 11.8* 8.9 6.1 6.4  NEUTROABS  --  9.3*  --   --   --   HGB 13.7 12.9 10.8* 12.4 12.4  HCT 39.8 38.3 31.2* 35.7 35.4  MCV 89.4 91.6 91.9 91.5 89.0  PLT 331 313 267 299 325    Cardiac Enzymes:  Recent Labs Lab 04/20/15 1952 04/21/15 0422 04/21/15 1242 04/21/15 1918  TROPONINI <0.03 <0.03 <0.03 0.03    Lipid Panel:  Recent Labs Lab 04/21/15 0422  CHOL 123  TRIG 65  HDL 45  CHOLHDL 2.7  VLDL 13  LDLCALC 65    CBG: No results for input(s): GLUCAP in the last 168 hours.  Microbiology: Results for orders placed or performed during the hospital  encounter of 04/20/15  Culture, blood (routine x 2)     Status: None (Preliminary result)   Collection Time: 04/20/15  8:23 PM  Result Value Ref Range Status   Specimen Description BLOOD LEFT FATTY CASTS  Final   Special Requests   Final    BOTTLES DRAWN AEROBIC AND ANAEROBIC  2CCAERO, [place]   Culture NO GROWTH 3 DAYS  Final   Report Status PENDING  Incomplete  Culture, blood (routine x 2)     Status: None (Preliminary result)   Collection Time: 04/20/15  9:20 PM  Result Value Ref Range Status   Specimen Description BLOOD RIGHT ANTECUBITAL  Final   Special Requests BOTTLES DRAWN AEROBIC AND ANAEROBIC 5ML  Final   Culture NO GROWTH 3 DAYS  Final   Report Status PENDING  Incomplete  Gastrointestinal Panel by PCR , Stool     Status: None   Collection Time:  04/21/15  6:40 PM  Result Value Ref Range Status   Campylobacter species NOT DETECTED NOT DETECTED Final   Plesimonas shigelloides NOT DETECTED NOT DETECTED Final   Salmonella species NOT DETECTED NOT DETECTED Final   Yersinia enterocolitica NOT DETECTED NOT DETECTED Final   Vibrio species NOT DETECTED NOT DETECTED Final   Vibrio cholerae NOT DETECTED NOT DETECTED Final   Enteroaggregative E coli (EAEC) NOT DETECTED NOT DETECTED Final   Enteropathogenic E coli (EPEC) NOT DETECTED NOT DETECTED Final   Enterotoxigenic E coli (ETEC) NOT DETECTED NOT DETECTED Final   Shiga like toxin producing E coli (STEC) NOT DETECTED NOT DETECTED Final   E. coli O157 NOT DETECTED NOT DETECTED Final   Shigella/Enteroinvasive E coli (EIEC) NOT DETECTED NOT DETECTED Final   Cryptosporidium NOT DETECTED NOT DETECTED Final   Cyclospora cayetanensis NOT DETECTED NOT DETECTED Final   Entamoeba histolytica NOT DETECTED NOT DETECTED Final   Giardia lamblia NOT DETECTED NOT DETECTED Final   Adenovirus F40/41 NOT DETECTED NOT DETECTED Final   Astrovirus NOT DETECTED NOT DETECTED Final   Norovirus GI/GII NOT DETECTED NOT DETECTED Final   Rotavirus A NOT DETECTED NOT DETECTED Final   Sapovirus (I, II, IV, and V) NOT DETECTED NOT DETECTED Final    Coagulation Studies: No results for input(s): LABPROT, INR in the last 72 hours.  Imaging: Mr Herby Abraham Contrast  04/21/2015  CLINICAL DATA:  TIA with left arm sensory deficit and transient aphasia. EXAM: MRI HEAD WITHOUT CONTRAST MRA HEAD WITHOUT CONTRAST TECHNIQUE: Multiplanar, multiecho pulse sequences of the brain and surrounding structures were obtained without intravenous contrast. Angiographic images of the head were obtained using MRA technique without contrast. COMPARISON:  12/02/2013 neck MRI. FINDINGS: MRI HEAD FINDINGS Calvarium and upper cervical spine: No focal marrow signal abnormality. Orbits: Negative. Sinuses and Mastoids: Clear. Brain: No acute or remote  infarct, hemorrhage, hydrocephalus, or mass lesion. No evidence of large vessel occlusion. Generalized atrophy, mild to moderate. Mild/age congruent small-vessel ischemic change in the cerebral white matter. MRA HEAD FINDINGS Motion degraded. Symmetric carotid and vertebral branching and size. Smooth and patent vertebral, basilar, and proximal posterior cerebral arteries as permitted by motion artifact. Symmetric carotid arteries. Carotid siphons are smooth and patent accounting for motion. Intact circle-of-Willis with small communicating arteries. No notable atherosclerotic irregularity, beading, major vessel occlusion, or flow limiting stenosis. Negative for aneurysm (left ACA branch point prominence shows no aneurysm on sagittal reformats-representative images sent to PACS. IMPRESSION: 1. No acute finding, including infarct.  Negative intracranial MRA. 2. Generalized atrophy. Electronically Signed   By: Angelica Chessman  Watts M.D.   On: 04/21/2015 12:14   US Carotid Bilateral  04/21/2015  CLINICAL DATA:  Hypertension, TIA symptoms, syncope, visual disturbance EXAM: BILATERAL CAROTID DUPLEX ULTRASOUND TECHNIQUE: Pearline Cables scale imaging, color Doppler and duplex ultrasound were performed of bilateral carotid and vertebral arteries in the neck. COMPARISON:  None. FINDINGS: Criteria: Quantification of carotid stenosis is based on velocity parameters that correlate the residual internal carotid diameter with NASCET-based stenosis levels, using the diameter of the distal internal carotid lumen as the denominator for stenosis measurement. The following velocity measurements were obtained: RIGHT ICA:  111/36 cm/sec CCA:  123456 cm/sec SYSTOLIC ICA/CCA RATIO:  1.5 DIASTOLIC ICA/CCA RATIO:  2.2 ECA:  75 cm/sec LEFT ICA:  159/55 cm/sec CCA:  123XX123 cm/sec SYSTOLIC ICA/CCA RATIO:  1.7 DIASTOLIC ICA/CCA RATIO:  2.5 ECA:  75 cm/sec RIGHT CAROTID ARTERY: Minor carotid intimal thickening without significant plaque formation. No  hemodynamically significant right ICA stenosis, velocity elevation, or turbulent flow. Degree of narrowing less than 50%. RIGHT VERTEBRAL ARTERY:  Antegrade LEFT CAROTID ARTERY: Similar scattered minor intimal thickening without significant plaque formation. No hemodynamically significant left ICA stenosis, velocity elevation, or turbulent flow. LEFT VERTEBRAL ARTERY:  Antegrade IMPRESSION: Minor carotid intimal thickening. No significant atherosclerosis. No hemodynamically significant ICA stenosis. Patent antegrade vertebral flow bilaterally. Electronically Signed   By: Jerilynn Mages.  Shick M.D.   On: 04/21/2015 11:14   Mr Lovenia Kim  04/21/2015  CLINICAL DATA:  TIA with left arm sensory deficit and transient aphasia. EXAM: MRI HEAD WITHOUT CONTRAST MRA HEAD WITHOUT CONTRAST TECHNIQUE: Multiplanar, multiecho pulse sequences of the brain and surrounding structures were obtained without intravenous contrast. Angiographic images of the head were obtained using MRA technique without contrast. COMPARISON:  12/02/2013 neck MRI. FINDINGS: MRI HEAD FINDINGS Calvarium and upper cervical spine: No focal marrow signal abnormality. Orbits: Negative. Sinuses and Mastoids: Clear. Brain: No acute or remote infarct, hemorrhage, hydrocephalus, or mass lesion. No evidence of large vessel occlusion. Generalized atrophy, mild to moderate. Mild/age congruent small-vessel ischemic change in the cerebral white matter. MRA HEAD FINDINGS Motion degraded. Symmetric carotid and vertebral branching and size. Smooth and patent vertebral, basilar, and proximal posterior cerebral arteries as permitted by motion artifact. Symmetric carotid arteries. Carotid siphons are smooth and patent accounting for motion. Intact circle-of-Willis with small communicating arteries. No notable atherosclerotic irregularity, beading, major vessel occlusion, or flow limiting stenosis. Negative for aneurysm (left ACA branch point prominence shows no aneurysm on sagittal  reformats-representative images sent to PACS. IMPRESSION: 1. No acute finding, including infarct.  Negative intracranial MRA. 2. Generalized atrophy. Electronically Signed   By: Monte Fantasia M.D.   On: 04/21/2015 12:14    Medications:  I have reviewed the patient's current medications. Scheduled: . amLODipine  2.5 mg Oral Daily  . ciprofloxacin  400 mg Intravenous Q12H  . cycloSPORINE  1 drop Both Eyes BID  . feeding supplement  1 Container Oral TID BM  . levothyroxine  100 mcg Oral QAC breakfast  . metronidazole  500 mg Intravenous Q8H  . pantoprazole (PROTONIX) IV  40 mg Intravenous Q12H  . PARoxetine  30 mg Oral Daily  . sodium chloride flush  3 mL Intravenous Q12H  . valproic acid  250 mg Oral TID    Assessment/Plan: Sensory examination improved today.  Echocardiogram shows an EF of 60-65%.  No cardiac source of emboli identified.  B12 normal at 491.  TSH elevated at 10.383.  B1 pending.  Recommendations: 1.  Prophylactic therapy-Would start antiplatelet therapy once medically  appropriate. Patient with multiple GI issues that at this point would be a contraindication to antiplatelet therapy.  2.  Agree with addressing thyroid abnormalities.   3.  No further neurologic intervention is recommended at this time.  If further questions arise, please call or page at that time.  Thank you for allowing neurology to participate in the care of this patient.    LOS: 3 days   Alexis Goodell, MD Neurology [phone] 04/23/2015  9:31 AM

## 2015-04-23 NOTE — Progress Notes (Signed)
Pharmacy Consult for electrolyte monitoring/replacment    Recent Labs  04/20/15 1952 04/21/15 0422 04/22/15 0513 04/23/15 0518 04/23/15 1049 04/23/15 1900  NA 134* 138 135  --  129*  --   K 3.5 3.2* 3.5  --  2.8* 3.5  CL 104 111 107  --  103  --   CO2 24 22 22   --  17*  --   GLUCOSE 80 121* 90  --  156*  --   BUN 5* <5* <5*  --  <5*  --   CREATININE 0.82 0.69 0.68  --  0.81  --   CALCIUM 8.9 7.8* 8.5*  --  8.1*  --   MG  --  1.8  --  1.8 1.6*  --   PROT 7.3  --   --   --   --   --   ALBUMIN 3.8  --   --   --   --   --   AST 31  --   --   --   --   --   ALT 25  --   --   --   --   --   ALKPHOS 74  --   --   --   --   --   BILITOT 0.3  --   --   --   --   --   TRIG  --  65  --   --   --   --   CHOLHDL  --  2.7  --   --   --   --   CHOL  --  123  --   --   --   --    Estimated Creatinine Clearance: 52.5 mL/min (by C-G formula based on Cr of 0.81).   No results for input(s): GLUCAP in the last 72 hours.  Assessment: Patient with orders for potassium 92mEq PO and 46mEq IV as well as magnesium 2gm IV  4/7 1900 K=3.5  Plan:  Will follow up electrolytes tomorrow morning  Paulina Fusi, PharmD, BCPS 04/23/2015 7:45 PM

## 2015-04-23 NOTE — Progress Notes (Signed)
PT Cancellation Note  Patient Details Name: Angela [name] MRN: [id number] DOB: [birth year]   Cancelled Treatment:    Reason Eval/Treat Not Completed: Patient declined, no reason specified, Pt stating "I've been through too much today".  Will re-attempt at later time   Rembert Browe 04/23/2015, 2:52 PM  Ondre Salvetti, PTA

## 2015-04-23 NOTE — Progress Notes (Signed)
Called Dr. Benjie Karvonen told her patient refusing any more IV  Potassium or Flagyl at this time and did not want to take oral IV  Antibiotics or oral potassium at this time either.  Per MD documented chart as such.  I had previously stressed the importance to patient of potassium replacement and getting her antibiotics, patient also did not want her IV  Normal saline at this time and refused another IV start at this time.  Current IV site flushes well with no redness or swelling noted.

## 2015-04-23 NOTE — Clinical Social Work Note (Signed)
Clinical Social Work Assessment  Patient Details  Name: Angela [name] MRN: [id number] Date of Birth: 1950-03-12  Date of referral:  04/23/15               Reason for consult:  Facility Placement                Permission sought to share information with:  Family Supports Permission granted to share information::  Yes, Verbal Permission Granted  Name::     Naoma Diener, friend   Housing/Transportation Living arrangements for the past 2 months:  [place] of Information:  Patient Patient Interpreter Needed:  None Criminal Activity/Legal Involvement Pertinent to Current Situation/Hospitalization:  No - Comment as needed Significant Relationships:  Friend, Mental Health Provider Lives with:  Roommate Do you feel safe going back to the place where you live?  Yes Need for family participation in patient care:  No (Coment)  Care giving concerns:  No caregiving concerns identified.    Social Worker assessment / plan:  CSW met with pt to address consult for SNF. CSW introduced herself and explained role of social work. CSW also explained the process of discharging to SNF with Medicare/Medicaid. PT is recommending SNF for STR. Pt lives in a boarding house and has support from a peer support person. Pt is anxious about discarging tomorrow, as she feels it will be too soon. CSW provided supportive counseling and her concerns. With pt's permission, CSW left a message requesting a return phone call from pt's peer support person. CSW initiatied SNF search and will follow up with bed offers. [facility] is pending.   Employment status:  Retired Forensic scientist:  Information systems manager, Medicaid In [place] PT Recommendations:  [place] / Referral to community resources:  [place]  Patient/Family's Response to care:  Pt was appreciative of CSW support.   Patient/Family's Understanding of and Emotional Response to Diagnosis, Current Treatment, and  Prognosis:  Pt understands that she will benefit from PT at SNF prior to returning to her home.   Emotional Assessment Appearance:  Appears stated age Attitude/Demeanor/Rapport:  Guarded Affect (typically observed):  Apprehensive Orientation:  Oriented to Self, Oriented to Place, Oriented to  Time, Oriented to Situation Alcohol / Substance use:  Never Used Psych involvement (Current and /or in the community):  Yes (Comment)  Discharge Needs  Concerns to be addressed:  Adjustment to Illness Readmission within the last 30 days:  No Current discharge risk:  Chronically ill Barriers to Discharge:  Continued Medical Work up   [employer], LCSW 04/23/2015, 8:06 PM

## 2015-04-23 NOTE — Progress Notes (Signed)
Speech Therapy Note: reviewed chart notes; consulted NSG re: pt's status today. Pt continues to tolerate a clear liquid diet as ordered by MD/GI sec. to GI issues(GI following). Patient complaining of nausea and abdominal pain at times and requests maalox for heartburn per MD note. No decline in respiratory status and no increased temps. noted.  Pt appears at her baseline for oropharyngeal swallowing and toleration of po's rec'd by MD at this time sec. to GI issues. Rec. continued f/u w/ GI for any further c/o difficulty swallowing of solids as this has been attempted to be addressed by GI previously w/ a barium study(GI). ST will sign off at this time. NSG to reconsult if any change in oropharyngeal swallowing status.

## 2015-04-23 NOTE — Clinical Social Work Placement (Signed)
   CLINICAL SOCIAL WORK PLACEMENT  NOTE  Date:  04/23/2015  Patient Details  Name: Angela [name] MRN: [id number] Date of Birth: 22-Jul-1950  Clinical Social Work is seeking post-discharge placement for this patient at the [place] level of care (*CSW will initial, date and re-position this form in  chart as items are completed):  Yes   Patient/family provided with [place] Work Department's list of facilities offering this level of care within the geographic area requested by the patient (or if unable, by the patient's family).      Patient/family informed of their freedom to choose among providers that offer the needed level of care, that participate in Medicare, Medicaid or managed care program needed by the patient, have an available bed and are willing to accept the patient.  Yes   Patient/family informed of [facility]'s ownership interest in [facility] and [facility], as well as of the fact that they are under no obligation to receive care at these facilities.  PASRR submitted to EDS on 04/23/15     PASRR number received on       Existing PASRR number confirmed on       FL2 transmitted to all facilities in geographic area requested by pt/family on 04/23/15     FL2 transmitted to all facilities within larger geographic area on       Patient informed that his/her managed care company has contracts with or will negotiate with certain facilities, including the following:            Patient/family informed of bed offers received.  Patient chooses bed at       Physician recommends and patient chooses bed at      Patient to be transferred to   on  .  Patient to be transferred to facility by       Patient family notified on   of transfer.  Name of family member notified:        PHYSICIAN       Additional Comment:    _______________________________________________ Darden Dates, LCSW 04/23/2015, 3:43 PM

## 2015-04-23 NOTE — NC FL2 (Signed)
[place] LEVEL OF CARE SCREENING TOOL     IDENTIFICATION  Patient Name: Angela [name] Birthdate: 10-19-1950 Sex: female Admission Date (Current Location): 04/20/2015  [facility] and [place] Number:  Engineering geologist and Address:  [facility], [address] [place], [place] [address]      Provider Number: [id number]  Attending Physician Name and Address:  Bettey Costa, MD  Relative Name and Phone Number:       Current Level of Care: Hospital Recommended Level of Care: [place] Prior Approval Number:    Date Approved/Denied:   PASRR Number:    Discharge Plan: SNF    Current Diagnoses: Patient Active Problem List   Diagnosis Date Noted  . Colitis, acute 04/20/2015  . Melena 04/20/2015  . Accelerated hypertension 04/20/2015  . TIA (transient ischemic attack) 04/20/2015  . Hypothyroidism 04/20/2015  . GERD (gastroesophageal reflux disease) 04/20/2015  . Chest pain 04/20/2015  . Acute colitis 04/20/2015  . Pelvic pain in female 10/30/2014    Orientation RESPIRATION BLADDER Height & Weight     Self, Time, Situation, Place  Normal Continent Weight: 121 lb 4.8 oz (55.021 kg) Height:  5' (152.4 cm)  BEHAVIORAL SYMPTOMS/MOOD NEUROLOGICAL BOWEL NUTRITION STATUS      Continent Diet (Full Liquids)  AMBULATORY STATUS COMMUNICATION OF NEEDS Skin   Limited Assist Verbally Normal                       Personal Care Assistance Level of Assistance  Bathing, Feeding, Dressing Bathing Assistance: Limited assistance Feeding assistance: Independent Dressing Assistance: Limited assistance     Functional Limitations Info  Sight, Hearing, Speech Sight Info: Adequate Hearing Info: Adequate Speech Info: Adequate    SPECIAL CARE FACTORS FREQUENCY                       Contractures      Additional Factors Info  Code Status, Allergies, Psychotropic, Isolation Precautions Code Status Info: Full  Code Allergies Info: Allergies: Bee Venom, Darvon, Peanuts, Penicillins, Percocet, Sulfa Antibiotics, Sulfacetamide Sodium, Adhesive, Amikacin, Aspirin, Doxycycline, Haloperidol, Hymenoptera Venom Preparations, Ibuprofen, Imipramine, Silicon, Ultram, Epinephrine, Hydroxyzine, Morphine And Related, Prednisone Psychotropic Info: Medications   Isolation Precautions Info: Enteric Precautions     Current Medications (04/23/2015):  This is the current hospital active medication list Current Facility-Administered Medications  Medication Dose Route Frequency Provider Last Rate Last Dose  . 0.9 %  sodium chloride infusion   Intravenous Continuous Manya Silvas, MD      . 0.9 %  sodium chloride infusion   Intravenous Continuous Bettey Costa, MD 75 mL/hr at 04/23/15 1254    . acetaminophen (TYLENOL) tablet 650 mg  650 mg Oral Q6H PRN Lance Coon, MD       Or  . acetaminophen (TYLENOL) suppository 650 mg  650 mg Rectal Q6H PRN Lance Coon, MD      . alum & mag hydroxide-simeth (MAALOX/MYLANTA) 200-200-20 MG/5ML suspension 30 mL  30 mL Oral Q6H PRN Bettey Costa, MD      . amLODipine (NORVASC) tablet 2.5 mg  2.5 mg Oral Daily Lance Coon, MD   2.5 mg at 04/23/15 1040  . ciprofloxacin (CIPRO) IVPB 400 mg  400 mg Intravenous Q12H Lance Coon, MD   400 mg at 04/23/15 1408  . cycloSPORINE (RESTASIS) 0.05 % ophthalmic emulsion 1 drop  1 drop Both Eyes BID Hillary Bow, MD   1 drop at 04/23/15  1042  . feeding supplement (BOOST / RESOURCE BREEZE) liquid 1 Container  1 Container Oral TID BM Bettey Costa, MD   1 Container at 04/23/15 1400  . HYDROcodone-acetaminophen (NORCO/VICODIN) 5-325 MG per tablet 1 tablet  1 tablet Oral Q6H PRN Lance Coon, MD      . levothyroxine (SYNTHROID, LEVOTHROID) tablet 100 mcg  100 mcg Oral QAC breakfast Bettey Costa, MD   100 mcg at 04/23/15 1040  . magnesium hydroxide (MILK OF MAGNESIA) suspension 30 mL  30 mL Oral BID Sital Mody, MD   30 mL at 04/23/15 1045  . medroxyPROGESTERone  (PROVERA) tablet 10 mg  10 mg Oral Daily Rubie Maid, MD   10 mg at 04/23/15 1045  . metroNIDAZOLE (FLAGYL) IVPB 500 mg  500 mg Intravenous Q8H Lance Coon, MD   500 mg at 04/23/15 0528  . ondansetron (ZOFRAN) tablet 4 mg  4 mg Oral Q6H PRN Lance Coon, MD       Or  . ondansetron [facility]) injection 4 mg  4 mg Intravenous Q6H PRN Lance Coon, MD   4 mg at 04/23/15 0814  . pantoprazole (PROTONIX) injection 40 mg  40 mg Intravenous Q12H Lance Coon, MD   40 mg at 04/23/15 0814  . PARoxetine (PAXIL) tablet 30 mg  30 mg Oral Daily Lance Coon, MD   30 mg at 04/23/15 1040  . potassium chloride 10 mEq in 100 mL IVPB  10 mEq Intravenous Q1 Hr x 4 Sital Mody, MD   10 mEq at 04/23/15 1516  . potassium chloride SA (K-DUR,KLOR-CON) CR tablet 40 mEq  40 mEq Oral Q4H PRN Bettey Costa, MD   40 mEq at 04/23/15 1225  . sodium chloride flush (NS) 0.9 % injection 3 mL  3 mL Intravenous Q12H Lance Coon, MD   3 mL at 04/23/15 1000  . valproic acid (DEPAKENE) 250 MG/5ML syrup 250 mg  250 mg Oral TID Hillary Bow, MD   250 mg at 04/22/15 2312  . zolpidem (AMBIEN) tablet 5 mg  5 mg Oral QHS PRN Lance Coon, MD   5 mg at 04/23/15 0036     Discharge Medications: Please see discharge summary for a list of discharge medications.  Relevant Imaging Results:  Relevant Lab Results:   Additional Information SSN:  [id number]  Darden Dates, LCSW

## 2015-04-23 NOTE — Consult Note (Addendum)
GYNECOLOGY CONSULT HISTORY AND PHYSICAL  Reason for Consult: Postmenopausal bleeding Referring Physician: Bettey Costa, MD  HPI:  Angela [name] is an 65 y.o. P54 female who is currently admitted for suspected ischemic colitis.  Patient presented to the Emergency Room 2 days ago with complaints of abdominal pain and melena. .Also with noted neurological symptoms ( left arm sensory deficit and transient aphasia).  Patient has begun noting vaginal bleeding with passage of small to medium sized clots x 1 day.    Of note, patient has a history of PMB over the past several years. Has been worked up several times, with no significant findings.  Was seen in GYN office last week for further discussion and management of PMB.  Recommended repeat Hysteroscopy D&C, however patient desired to hold on this until May since she had not had any further bleeding episodes since January.   Pertinent Gynecological History: Menses: post-menopausal Bleeding: post menopausal bleeding Contraception: post menopausal status DES exposure: denies Blood transfusions: none Sexually transmitted diseases: no past history Previous GYN Procedures: D&C Last mammogram: normal Date: 04/2013 Last pap: normal Date: 2014 OB History: G2, P1   Menstrual History: Menarche age: 43  No LMP recorded. Patient is postmenopausal.    Past Medical History  Diagnosis Date  . Endometriosis   . Hypothyroidism   . GERD (gastroesophageal reflux disease)   . Bipolar 1 disorder ([place])   . Hypertension   . Hernia, hiatal   . Allergic rhinitis   . Bronchitis   . Endometriosis   . Otalgia   . Otitis media   . Chronic sinusitis   . skin cancer   . Malignant neoplasm of skin   . Facial pain   . Dizziness   . Blurred vision   . Neuropathy ([place])   . Thyroid disease   . Anemia   . Depression   . Migraine   . Chronic constipation   . Diverticulitis   . Diverticulosis   . GERD (gastroesophageal reflux disease)     Past  Surgical History  Procedure Laterality Date  . Tonsillectomy    . Uteral suspension    . Appendectomy    . Hernia repair    . Laproscopy    . Cholecystectomy, laparoscopic    . Cholecystectomy    . Esophagogastroduodenoscopy (egd) with propofol N/A 03/29/2015    Procedure: ESOPHAGOGASTRODUODENOSCOPY (EGD) WITH PROPOFOL;  Surgeon: Josefine Class, MD;  Location: [facility] ENDOSCOPY;  Service: Endoscopy;  Laterality: N/A;    Family History  Problem Relation Age of Onset  . Heart disease Father   . Cancer Mother     lung  . Urolithiasis Neg Hx   . Kidney disease Neg Hx   . Kidney cancer Neg Hx   . Prostate cancer Neg Hx     Social History:  reports that she has quit smoking. She has never used smokeless tobacco. She reports that she does not drink alcohol or use illicit drugs.  Allergies:  Allergies  Allergen Reactions  . Bee Venom Hives  . Darvon [Propoxyphene] Anaphylaxis and Hives  . Peanuts [Peanut Oil] Anaphylaxis and Hives  . Penicillins Hives and Other (See Comments)    Has patient had a PCN reaction causing immediate rash, facial/tongue/throat swelling, SOB or lightheadedness with hypotension: Unsure  Has patient had a PCN reaction causing severe rash involving mucus membranes or skin necrosis: Unsure  Has patient had a PCN reaction that required hospitalization Unsure  Has patient had a PCN reaction occurring within  the last 10 years: Unsure  If all of the above answers are "NO", then may proceed with Cephalosporin use.  Marland Kitchen Percocet [Oxycodone-Acetaminophen] Nausea And Vomiting and Anxiety    Reaction:  Hallucinations   . Sulfa Antibiotics Anaphylaxis, Hives and Itching  . Sulfacetamide Sodium Anaphylaxis, Hives and Itching  . Adhesive [Tape] Hives  . Amikacin Nausea And Vomiting  . Aspirin Hives  . Doxycycline Photosensitivity  . Haloperidol Nausea And Vomiting  . Hymenoptera Venom Preparations Hives  . Ibuprofen Hives and Nausea And Vomiting  . Imipramine Hives   . Silicon Hives  . Ultram [Tramadol] Hives  . Epinephrine Palpitations  . Hydroxyzine Anxiety  . Morphine And Related Nausea And Vomiting, Anxiety and Other (See Comments)    Reaction:  Hallucinations   . Prednisone Nausea And Vomiting, Anxiety and Other (See Comments)    Reaction:  Strange thoughts     Medications:  Prior to Admission:  Prescriptions prior to admission  Medication Sig Dispense Refill Last Dose  . ciprofloxacin (CIPRO) 500 MG tablet Take 1 tablet (500 mg total) by mouth 2 (two) times daily. 14 tablet 0   . fluconazole (DIFLUCAN) 150 MG tablet Take 1 tablet (150 mg total) by mouth once. Can take additional dose three days later if symptoms persist 1 tablet 3 04/15/2015 at Unknown time  . acetaminophen (RA ACETAMINOPHEN) 650 MG CR tablet Take by mouth.   Taking  . amLODipine (NORVASC) 2.5 MG tablet Take 2.5 mg by mouth daily.   Taking  . Biotin 5000 MCG CAPS Take 1 capsule by mouth daily.    Taking  . calcium carbonate (OS-CAL) 600 MG TABS tablet Take 600 mg by mouth 3 (three) times daily with meals.   Taking  . calcium carbonate (OSCAL) 1500 (600 Ca) MG TABS tablet Take by mouth.   Taking  . cetirizine (ZYRTEC) 10 MG tablet Take 10 mg by mouth daily.   Taking  . Cholecalciferol (VITAMIN D-1000 MAX ST) 1000 units tablet Take by mouth.   Taking  . conjugated estrogens (PREMARIN) vaginal cream Place 1 Applicatorful vaginally daily.   Taking  . cycloSPORINE (RESTASIS) 0.05 % ophthalmic emulsion Apply to eye.   Taking  . divalproex (DEPAKOTE ER) 250 MG 24 hr tablet Take 1 tablet by mouth 3 (three) times daily.     . divalproex (DEPAKOTE) 500 MG DR tablet Take 500 mg by mouth 3 (three) times daily.   Taking  . EPINEPHrine (EPIPEN 2-PAK) 0.3 mg/0.3 mL IJ SOAJ injection Inject 0.3 mg into the skin.   Taking  . erythromycin ophthalmic ointment 1 application at bedtime.   Taking  . HYDROcodone-acetaminophen (NORCO/VICODIN) 5-325 MG per tablet Take 1 tablet by mouth every 6 (six)  hours as needed for moderate pain. 5 tablet 0 Taking  . hydrocortisone (ANUSOL-HC) 2.5 % rectal cream Place 1 application rectally 2 (two) times daily.   Taking  . Hypromellose (GENTEAL MILD) 0.2 % SOLN Apply to eye.   Taking  . ipratropium (ATROVENT) 0.03 % nasal spray Place 2 sprays into both nostrils every 12 (twelve) hours.   Taking  . LACTOBACILLUS EXTRA STRENGTH CAPS Take by mouth.   Taking  . LACTOBACILLUS PO Take by mouth.   Taking  . lactulose (CEPHULAC) 10 G packet Take 1 packet (10 g total) by mouth daily as needed (constipation). 10 each 0 Taking  . levothyroxine (SYNTHROID, LEVOTHROID) 88 MCG tablet Take 88 mcg by mouth daily before breakfast.   Taking  . lidocaine (XYLOCAINE) 2 %  solution    Taking  . Lifitegrast (XIIDRA) 5 % SOLN Apply to eye.   Taking  . Linaclotide (LINZESS) 290 MCG CAPS capsule Take 290 mcg by mouth daily.   Taking  . magnesium hydroxide (MILK OF MAGNESIA) 400 MG/5ML suspension Take by mouth daily as needed for mild constipation.   Taking  . magnesium sulfate (EPSOM SALTS) crystals Use 1/2 cup X one quart warm water daily. 1816 g 3 Taking  . Menthol-Methyl Salicylate (ICY HOT EXTRA STRENGTH) 10-30 % CREA Apply topically.   Taking  . metroNIDAZOLE (FLAGYL) 500 MG tablet Take 1 tablet (500 mg total) by mouth 3 (three) times daily. 21 tablet 0   . multivitamin-iron-minerals-folic acid (THERAPEUTIC-M) TABS tablet Take 1 tablet by mouth daily.   Taking  . Omega-3 Krill Oil 1000 MG CAPS Take by mouth.   Taking  . ondansetron (ZOFRAN-ODT) 8 MG disintegrating tablet Take 8 mg by mouth every 8 (eight) hours as needed for nausea or vomiting.   Taking  . pantoprazole (PROTONIX) 40 MG tablet Take 40 mg by mouth daily.   Taking  . PARoxetine (PAXIL) 30 MG tablet Take 30 mg by mouth daily.   Taking  . promethazine (PHENERGAN) 12.5 MG tablet Take 1 tablet (12.5 mg total) by mouth every 6 (six) hours as needed for nausea or vomiting. 15 tablet 0 Taking  . promethazine  (PHENERGAN) 25 MG tablet Take 1 tablet (25 mg total) by mouth every 6 (six) hours as needed for nausea or vomiting. 10 tablet 0 Taking  . Spirulina 500 MG TABS Take by mouth.   Taking  . terbinafine (LAMISIL) 1 % cream Apply 1 application topically 2 (two) times daily.   Taking  . terconazole (TERAZOL 7) 0.4 % vaginal cream Place 1 applicator vaginally at bedtime.   Taking  . tiZANidine (ZANAFLEX) 4 MG tablet Take 4 mg by mouth every 6 (six) hours as needed for muscle spasms.   Taking  . triamcinolone cream (KENALOG) 0.1 % Apply 1 application topically 2 (two) times daily.   Taking  . zolpidem (AMBIEN) 10 MG tablet Take 10 mg by mouth at bedtime as needed for sleep.   Taking    ROS  Constitutional: Positive for chills, no document of fevers. Eyes: Negative for visual changes. Positive for dry eyes ENT: Negative for sore throat. Cardiovascular: Negative for chest pain. Respiratory: Negative for shortness of breath. Negative for cough Gastrointestinal: positive for blood in stools, diarrhea. Negative for nausea/vomting. Genitourinary: Positive for vaginal bleeding vs rectal bleeding. Negative for dysuria. Musculoskeletal: Negative for back pain. Skin: Negative for rash. Neurological: Negative for headache.   Blood pressure 148/91, pulse 74, temperature 98.7 F (37.1 C), temperature source Oral, resp. rate 18, height 5' (1.524 m), weight 121 lb 4.8 oz (55.021 kg), SpO2 100 %. Physical Exam  Constitutional: She appears well-developed and well-nourished. No distress.  HENT:  Head: Normocephalic and atraumatic.  Eyes: Conjunctivae and EOM are normal. Pupils are equal, round, and reactive to light.  Neck: Normal range of motion. Neck supple.  Cardiovascular: Normal rate and regular rhythm.  Exam reveals no gallop and no friction rub.   No murmur heard. Respiratory: Effort normal and breath sounds normal. No respiratory distress.  GI: Soft. Bowel sounds are normal. She exhibits no  distension and no mass. There is no tenderness.  Genitourinary: Uterus normal. No vaginal discharge found.  External genitalia normal, small vulvar biopsy sites healing well.  Patient declined speculum exam but did allow digital exam.  Blood noted on glove, no vaginal or cervical lesions palpated.  Uterus normal in size, mobile, nontender.  Adnexae non-palpable.   Musculoskeletal: Normal range of motion.  Neurological: She is alert.  Skin: Skin is warm and dry.    Results for orders placed or performed during the hospital encounter of 04/20/15 (from the past 48 hour(s))  Troponin I     Status: None   Collection Time: 04/21/15  4:22 AM  Result Value Ref Range   Troponin I <0.03 <0.031 ng/mL    Comment:        NO INDICATION OF MYOCARDIAL INJURY.   Basic metabolic panel     Status: Abnormal   Collection Time: 04/21/15  4:22 AM  Result Value Ref Range   Sodium 138 135 - 145 mmol/L   Potassium 3.2 (L) 3.5 - 5.1 mmol/L   Chloride 111 101 - 111 mmol/L   CO2 22 22 - 32 mmol/L   Glucose, Bld 121 (H) 65 - 99 mg/dL   BUN <5 (L) 6 - 20 mg/dL   Creatinine, Ser 0.69 0.44 - 1.00 mg/dL   Calcium 7.8 (L) 8.9 - 10.3 mg/dL   GFR calc non Af Amer >60 >60 mL/min   GFR calc Af Amer >60 >60 mL/min    Comment: (NOTE) The eGFR has been calculated using the CKD EPI equation. This calculation has not been validated in all clinical situations. eGFR's persistently <60 mL/min signify possible Chronic Kidney Disease.    Anion gap 5 5 - 15  CBC     Status: Abnormal   Collection Time: 04/21/15  4:22 AM  Result Value Ref Range   WBC 8.9 3.6 - 11.0 K/uL   RBC 3.40 (L) 3.80 - 5.20 MIL/uL   Hemoglobin 10.8 (L) 12.0 - 16.0 g/dL   HCT 31.2 (L) 35.0 - 47.0 %   MCV 91.9 80.0 - 100.0 fL   MCH 31.7 26.0 - 34.0 pg   MCHC 34.5 32.0 - 36.0 g/dL   RDW 13.5 11.5 - 14.5 %   Platelets 267 150 - 440 K/uL  Lipid panel     Status: None   Collection Time: 04/21/15  4:22 AM  Result Value Ref Range   Cholesterol 123 0 -  200 mg/dL   Triglycerides 65 <150 mg/dL   HDL 45 >40 mg/dL   Total CHOL/HDL Ratio 2.7 RATIO   VLDL 13 0 - 40 mg/dL   LDL Cholesterol 65 0 - 99 mg/dL    Comment:        Total Cholesterol/HDL:CHD Risk Coronary Heart Disease Risk Table                     Men   Women  1/2 Average Risk   3.4   3.3  Average Risk       5.0   4.4  2 X Average Risk   9.6   7.1  3 X Average Risk  23.4   11.0        Use the calculated Patient Ratio above and the CHD Risk Table to determine the patient's CHD Risk.        ATP III CLASSIFICATION (LDL):  <100     mg/dL   Optimal  100-129  mg/dL   Near or Above                    Optimal  130-159  mg/dL   Borderline  160-189  mg/dL   High  >190  mg/dL   Very High   Hemoglobin A1c     Status: None   Collection Time: 04/21/15  4:22 AM  Result Value Ref Range   Hgb A1c MFr Bld 5.7 4.0 - 6.0 %  Magnesium     Status: None   Collection Time: 04/21/15  4:22 AM  Result Value Ref Range   Magnesium 1.8 1.7 - 2.4 mg/dL  Urinalysis complete, with microscopic     Status: Abnormal   Collection Time: 04/21/15  5:18 AM  Result Value Ref Range   Color, Urine YELLOW (A) YELLOW   APPearance CLEAR (A) CLEAR   Glucose, UA NEGATIVE NEGATIVE mg/dL   Bilirubin Urine NEGATIVE NEGATIVE   Ketones, ur 1+ (A) NEGATIVE mg/dL   Specific Gravity, Urine 1.009 1.005 - 1.030   Hgb urine dipstick 2+ (A) NEGATIVE   pH 6.0 5.0 - 8.0   Protein, ur NEGATIVE NEGATIVE mg/dL   Nitrite NEGATIVE NEGATIVE   Leukocytes, UA 2+ (A) NEGATIVE   RBC / HPF TOO NUMEROUS TO COUNT 0 - 5 RBC/hpf   WBC, UA 6-30 0 - 5 WBC/hpf   Bacteria, UA MANY (A) NONE SEEN   Squamous Epithelial / LPF 6-30 (A) NONE SEEN   Mucous PRESENT   Troponin I     Status: None   Collection Time: 04/21/15 12:42 PM  Result Value Ref Range   Troponin I <0.03 <0.031 ng/mL    Comment:        NO INDICATION OF MYOCARDIAL INJURY.   Gastrointestinal Panel by PCR , Stool     Status: None   Collection Time: 04/21/15  6:40 PM   Result Value Ref Range   Campylobacter species NOT DETECTED NOT DETECTED   Plesimonas shigelloides NOT DETECTED NOT DETECTED   Salmonella species NOT DETECTED NOT DETECTED   Yersinia enterocolitica NOT DETECTED NOT DETECTED   Vibrio species NOT DETECTED NOT DETECTED   Vibrio cholerae NOT DETECTED NOT DETECTED   Enteroaggregative E coli (EAEC) NOT DETECTED NOT DETECTED   Enteropathogenic E coli (EPEC) NOT DETECTED NOT DETECTED   Enterotoxigenic E coli (ETEC) NOT DETECTED NOT DETECTED   Shiga like toxin producing E coli (STEC) NOT DETECTED NOT DETECTED   E. coli O157 NOT DETECTED NOT DETECTED   Shigella/Enteroinvasive E coli (EIEC) NOT DETECTED NOT DETECTED   Cryptosporidium NOT DETECTED NOT DETECTED   Cyclospora cayetanensis NOT DETECTED NOT DETECTED   Entamoeba histolytica NOT DETECTED NOT DETECTED   Giardia lamblia NOT DETECTED NOT DETECTED   Adenovirus F40/41 NOT DETECTED NOT DETECTED   Astrovirus NOT DETECTED NOT DETECTED   Norovirus GI/GII NOT DETECTED NOT DETECTED   Rotavirus A NOT DETECTED NOT DETECTED   Sapovirus (I, II, IV, and V) NOT DETECTED NOT DETECTED  Troponin I     Status: None   Collection Time: 04/21/15  7:18 PM  Result Value Ref Range   Troponin I 0.03 <0.031 ng/mL    Comment:        NO INDICATION OF MYOCARDIAL INJURY.   CBC     Status: None   Collection Time: 04/22/15  5:13 AM  Result Value Ref Range   WBC 6.1 3.6 - 11.0 K/uL   RBC 3.90 3.80 - 5.20 MIL/uL   Hemoglobin 12.4 12.0 - 16.0 g/dL   HCT 35.7 35.0 - 47.0 %   MCV 91.5 80.0 - 100.0 fL   MCH 31.7 26.0 - 34.0 pg   MCHC 34.6 32.0 - 36.0 g/dL   RDW 13.5 11.5 - 14.5 %  Platelets 299 150 - 440 K/uL  Basic metabolic panel     Status: Abnormal   Collection Time: 04/22/15  5:13 AM  Result Value Ref Range   Sodium 135 135 - 145 mmol/L   Potassium 3.5 3.5 - 5.1 mmol/L   Chloride 107 101 - 111 mmol/L   CO2 22 22 - 32 mmol/L   Glucose, Bld 90 65 - 99 mg/dL   BUN <5 (L) 6 - 20 mg/dL   Creatinine, Ser  0.68 0.44 - 1.00 mg/dL   Calcium 8.5 (L) 8.9 - 10.3 mg/dL   GFR calc non Af Amer >60 >60 mL/min   GFR calc Af Amer >60 >60 mL/min    Comment: (NOTE) The eGFR has been calculated using the CKD EPI equation. This calculation has not been validated in all clinical situations. eGFR's persistently <60 mL/min signify possible Chronic Kidney Disease.    Anion gap 6 5 - 15  TSH     Status: Abnormal   Collection Time: 04/22/15  1:41 PM  Result Value Ref Range   TSH 10.383 (H) 0.350 - 4.500 uIU/mL  Vitamin B12     Status: None   Collection Time: 04/22/15  1:41 PM  Result Value Ref Range   Vitamin B-12 491 180 - 914 pg/mL    Comment: (NOTE) This assay is not validated for testing neonatal or myeloproliferative syndrome specimens for Vitamin B12 levels. Performed at [facility]     Mr Brain Wo Contrast  04/21/2015  CLINICAL DATA:  TIA with left arm sensory deficit and transient aphasia. EXAM: MRI HEAD WITHOUT CONTRAST MRA HEAD WITHOUT CONTRAST TECHNIQUE: Multiplanar, multiecho pulse sequences of the brain and surrounding structures were obtained without intravenous contrast. Angiographic images of the head were obtained using MRA technique without contrast. COMPARISON:  12/02/2013 neck MRI. FINDINGS: MRI HEAD FINDINGS Calvarium and upper cervical spine: No focal marrow signal abnormality. Orbits: Negative. Sinuses and Mastoids: Clear. Brain: No acute or remote infarct, hemorrhage, hydrocephalus, or mass lesion. No evidence of large vessel occlusion. Generalized atrophy, mild to moderate. Mild/age congruent small-vessel ischemic change in the cerebral white matter. MRA HEAD FINDINGS Motion degraded. Symmetric carotid and vertebral branching and size. Smooth and patent vertebral, basilar, and proximal posterior cerebral arteries as permitted by motion artifact. Symmetric carotid arteries. Carotid siphons are smooth and patent accounting for motion. Intact circle-of-Willis with small communicating  arteries. No notable atherosclerotic irregularity, beading, major vessel occlusion, or flow limiting stenosis. Negative for aneurysm (left ACA branch point prominence shows no aneurysm on sagittal reformats-representative images sent to PACS. IMPRESSION: 1. No acute finding, including infarct.  Negative intracranial MRA. 2. Generalized atrophy. Electronically Signed   By: Monte Fantasia M.D.   On: 04/21/2015 12:14   US Carotid Bilateral  04/21/2015  CLINICAL DATA:  Hypertension, TIA symptoms, syncope, visual disturbance EXAM: BILATERAL CAROTID DUPLEX ULTRASOUND TECHNIQUE: Pearline Cables scale imaging, color Doppler and duplex ultrasound were performed of bilateral carotid and vertebral arteries in the neck. COMPARISON:  None. FINDINGS: Criteria: Quantification of carotid stenosis is based on velocity parameters that correlate the residual internal carotid diameter with NASCET-based stenosis levels, using the diameter of the distal internal carotid lumen as the denominator for stenosis measurement. The following velocity measurements were obtained: RIGHT ICA:  111/36 cm/sec CCA:  00/76 cm/sec SYSTOLIC ICA/CCA RATIO:  1.5 DIASTOLIC ICA/CCA RATIO:  2.2 ECA:  75 cm/sec LEFT ICA:  159/55 cm/sec CCA:  22/63 cm/sec SYSTOLIC ICA/CCA RATIO:  1.7 DIASTOLIC ICA/CCA RATIO:  2.5 ECA:  75 cm/sec RIGHT CAROTID ARTERY: Minor carotid intimal thickening without significant plaque formation. No hemodynamically significant right ICA stenosis, velocity elevation, or turbulent flow. Degree of narrowing less than 50%. RIGHT VERTEBRAL ARTERY:  Antegrade LEFT CAROTID ARTERY: Similar scattered minor intimal thickening without significant plaque formation. No hemodynamically significant left ICA stenosis, velocity elevation, or turbulent flow. LEFT VERTEBRAL ARTERY:  Antegrade IMPRESSION: Minor carotid intimal thickening. No significant atherosclerosis. No hemodynamically significant ICA stenosis. Patent antegrade vertebral flow bilaterally.  Electronically Signed   By: Jerilynn Mages.  Shick M.D.   On: 04/21/2015 11:14   Mr Lovenia Kim  04/21/2015  CLINICAL DATA:  TIA with left arm sensory deficit and transient aphasia. EXAM: MRI HEAD WITHOUT CONTRAST MRA HEAD WITHOUT CONTRAST TECHNIQUE: Multiplanar, multiecho pulse sequences of the brain and surrounding structures were obtained without intravenous contrast. Angiographic images of the head were obtained using MRA technique without contrast. COMPARISON:  12/02/2013 neck MRI. FINDINGS: MRI HEAD FINDINGS Calvarium and upper cervical spine: No focal marrow signal abnormality. Orbits: Negative. Sinuses and Mastoids: Clear. Brain: No acute or remote infarct, hemorrhage, hydrocephalus, or mass lesion. No evidence of large vessel occlusion. Generalized atrophy, mild to moderate. Mild/age congruent small-vessel ischemic change in the cerebral white matter. MRA HEAD FINDINGS Motion degraded. Symmetric carotid and vertebral branching and size. Smooth and patent vertebral, basilar, and proximal posterior cerebral arteries as permitted by motion artifact. Symmetric carotid arteries. Carotid siphons are smooth and patent accounting for motion. Intact circle-of-Willis with small communicating arteries. No notable atherosclerotic irregularity, beading, major vessel occlusion, or flow limiting stenosis. Negative for aneurysm (left ACA branch point prominence shows no aneurysm on sagittal reformats-representative images sent to PACS. IMPRESSION: 1. No acute finding, including infarct.  Negative intracranial MRA. 2. Generalized atrophy. Electronically Signed   By: Monte Fantasia M.D.   On: 04/21/2015 12:14    Assessment/Plan: 1) Postmenopausal bleeding - discussion had with patient regarding diagnosis.  Has had episodes in the past with negative workup.  Had not had episode since January.  I do believe that as patient's immune system likely depressed, the bleeding is a response to the inflammation and stress ongoing in abdomen  due to colitis.   Also, has abnormal thyroid study, which can also cause abnormal uterine bleeding.  Once these issues are resolved, her bleeding will likely cease. However, if patient continues to pass heavy blood clots or bleeding becomes heavier, can treat with Provera 20 mg daily until cessation of symptoms.  Patient still scheduled to undergo repeat Hysteroscopy D&C as outpatient procedure.  Patient also seems to believe that her bleeding and symptoms may also be due to recent cessation of Depakote (has not taken in the past 5 days).  Has been refusing dosing here in hospital as she thinks the dose will be too high for her.  Notes that the medication has side effects including abnormal uterine bleeding.   2) Ischemic colitis - patient currently on antibiotics, did not take prep for colonoscopy, and previously declined surgical management.  Is being managed by Internist and Gastroenterology.   Thank you for the consult.  Will follow up regarding bleeding tomorrow.    Rubie Maid, MD [facility]

## 2015-04-23 NOTE — Consult Note (Signed)
[facility] Consult   Reason for Consult:  This is a 65 year old woman with a history of bipolar disorder. Consult for assistance in medication management. Referring Physician:  Mody Patient Identification: Angela [name] MRN:  [id number] Principal Diagnosis: Bipolar disorder Diagnosis:   Patient Active Problem List   Diagnosis Date Noted  . Bipolar 1 disorder ([place]) [F31.9] 04/23/2015  . Anxiety state [F41.1] 04/23/2015  . Colitis, acute [K52.9] 04/20/2015  . Melena [K92.1] 04/20/2015  . Accelerated hypertension [I10] 04/20/2015  . TIA (transient ischemic attack) [G45.9] 04/20/2015  . Hypothyroidism [E03.9] 04/20/2015  . GERD (gastroesophageal reflux disease) [K21.9] 04/20/2015  . Chest pain [R07.9] 04/20/2015  . Acute colitis [K52.9] 04/20/2015  . Pelvic pain in female [R10.2] 10/30/2014    Total Time spent with patient: 1 hour  Subjective:   Angela [name] is a 65 y.o. female patient admitted with "I just can't take those big pills".  HPI:  Patient interviewed. Chart reviewed. Patient is well known to me from multiple prior encounters. Labs and vitals reviewed. 65 year old woman with a history of bipolar disorder currently in the hospital because of GI complaints with probable colitis. Patient is anxious about trying to make sure her medicine is dosed right without making her more specific. She says that she cannot stand to swallow the large size pills and she doesn't think they're giving her the right dose anyway. Overall however her mood while nervous is not particularly bad. She is not feeling severely depressed. Not feeling anxious or angry. She hasn't slept well the last couple nights but prior to that was doing reasonably well at home. Denies any hallucinations. Denies suicidal or homicidal ideation. She had been continuing to take her Depakote 250 mg 3 times a day as prescribed by her outpatient psychiatrist as well as her Paxil and Ambien. Patient developed  abdominal pain and bloody diarrhea bringing her into the hospital. Doesn't report any really acute change in her mental health condition.  Social history: Patient lives in a house with several roommates. She is quite pleased with this. Used to be dependent on living in group homes and finds her current independence a great improvement. Has had some recent stresses a close friend of hers died of suicide not too long ago.  Medical history: Multiple medical problems acutely colitis, longer term high blood pressure history of TIA hypothyroidism  Substance abuse history: Doesn't drink doesn't abuse drugs no substance abuse past history. Only cigarettes.  Past Psychiatric History: Patient has a history of bipolar disorder. When she gets manic she tends to become extremely hyper religious and more agitated she does have a history of suicide attempts in the past and has had multiple hospitalizations recently seems to of been pretty stable when she is compliant with her medicine.  Risk to Self: Is patient at risk for suicide?: No Risk to Others:   Prior Inpatient Therapy:   Prior Outpatient Therapy:    Past Medical History:  Past Medical History  Diagnosis Date  . Endometriosis   . Hypothyroidism   . GERD (gastroesophageal reflux disease)   . Bipolar 1 disorder ([place])   . Hypertension   . Hernia, hiatal   . Allergic rhinitis   . Bronchitis   . Endometriosis   . Otalgia   . Otitis media   . Chronic sinusitis   . skin cancer   . Malignant neoplasm of skin   . Facial pain   . Dizziness   . Blurred vision   .  Neuropathy ([facility])   . Thyroid disease   . Anemia   . Depression   . Migraine   . Chronic constipation   . Diverticulitis   . Diverticulosis   . GERD (gastroesophageal reflux disease)     Past Surgical History  Procedure Laterality Date  . Tonsillectomy    . Uteral suspension    . Appendectomy    . Hernia repair    . Laproscopy    . Cholecystectomy, laparoscopic    .  Cholecystectomy    . Esophagogastroduodenoscopy (egd) with propofol N/A 03/29/2015    Procedure: ESOPHAGOGASTRODUODENOSCOPY (EGD) WITH PROPOFOL;  Surgeon: Elnita Maxwell, MD;  Location: [facility] ENDOSCOPY;  Service: Endoscopy;  Laterality: N/A;   Family History:  Family History  Problem Relation Age of Onset  . Heart disease Father   . Cancer Mother     lung  . Urolithiasis Neg Hx   . Kidney disease Neg Hx   . Kidney cancer Neg Hx   . Prostate cancer Neg Hx    Family Psychiatric  History: Family history positive for anxiety disorders Social History:  History  Alcohol Use No     History  Drug Use No    Social History   Social History  . Marital Status: Single    Spouse Name: N/A  . Number of Children: N/A  . Years of Education: N/A   Social History Main Topics  . Smoking status: Former Games developer  . Smokeless tobacco: Never Used  . Alcohol Use: No  . Drug Use: No  . Sexual Activity: Not Currently   Other Topics Concern  . None   Social History Narrative   Additional Social History:    Allergies:   Allergies  Allergen Reactions  . Bee Venom Hives  . Darvon [Propoxyphene] Anaphylaxis and Hives  . Peanuts [Peanut Oil] Anaphylaxis and Hives  . Penicillins Hives and Other (See Comments)    Has patient had a PCN reaction causing immediate rash, facial/tongue/throat swelling, SOB or lightheadedness with hypotension: Unsure  Has patient had a PCN reaction causing severe rash involving mucus membranes or skin necrosis: Unsure  Has patient had a PCN reaction that required hospitalization Unsure  Has patient had a PCN reaction occurring within the last 10 years: Unsure  If all of the above answers are "NO", then may proceed with Cephalosporin use.  Marland Kitchen Percocet [Oxycodone-Acetaminophen] Nausea And Vomiting and Anxiety    Reaction:  Hallucinations   . Sulfa Antibiotics Anaphylaxis, Hives and Itching  . Sulfacetamide Sodium Anaphylaxis, Hives and Itching  . Adhesive [Tape]  Hives  . Amikacin Nausea And Vomiting  . Aspirin Hives  . Doxycycline Photosensitivity  . Haloperidol Nausea And Vomiting  . Hymenoptera Venom Preparations Hives  . Ibuprofen Hives and Nausea And Vomiting  . Imipramine Hives  . Silicon Hives  . Ultram [Tramadol] Hives  . Epinephrine Palpitations  . Hydroxyzine Anxiety  . Morphine And Related Nausea And Vomiting, Anxiety and Other (See Comments)    Reaction:  Hallucinations   . Prednisone Nausea And Vomiting, Anxiety and Other (See Comments)    Reaction:  Strange thoughts     Labs:  Results for orders placed or performed during the hospital encounter of 04/20/15 (from the past 48 hour(s))  Gastrointestinal Panel by PCR , Stool     Status: None   Collection Time: 04/21/15  6:40 PM  Result Value Ref Range   Campylobacter species NOT DETECTED NOT DETECTED   Plesimonas shigelloides NOT DETECTED NOT DETECTED  Salmonella species NOT DETECTED NOT DETECTED   Yersinia enterocolitica NOT DETECTED NOT DETECTED   Vibrio species NOT DETECTED NOT DETECTED   Vibrio cholerae NOT DETECTED NOT DETECTED   Enteroaggregative E coli (EAEC) NOT DETECTED NOT DETECTED   Enteropathogenic E coli (EPEC) NOT DETECTED NOT DETECTED   Enterotoxigenic E coli (ETEC) NOT DETECTED NOT DETECTED   Shiga like toxin producing E coli (STEC) NOT DETECTED NOT DETECTED   E. coli O157 NOT DETECTED NOT DETECTED   Shigella/Enteroinvasive E coli (EIEC) NOT DETECTED NOT DETECTED   Cryptosporidium NOT DETECTED NOT DETECTED   Cyclospora cayetanensis NOT DETECTED NOT DETECTED   Entamoeba histolytica NOT DETECTED NOT DETECTED   Giardia lamblia NOT DETECTED NOT DETECTED   Adenovirus F40/41 NOT DETECTED NOT DETECTED   Astrovirus NOT DETECTED NOT DETECTED   Norovirus GI/GII NOT DETECTED NOT DETECTED   Rotavirus A NOT DETECTED NOT DETECTED   Sapovirus (I, II, IV, and V) NOT DETECTED NOT DETECTED  Troponin I     Status: None   Collection Time: 04/21/15  7:18 PM  Result Value  Ref Range   Troponin I 0.03 <0.031 ng/mL    Comment:        NO INDICATION OF MYOCARDIAL INJURY.   CBC     Status: None   Collection Time: 04/22/15  5:13 AM  Result Value Ref Range   WBC 6.1 3.6 - 11.0 K/uL   RBC 3.90 3.80 - 5.20 MIL/uL   Hemoglobin 12.4 12.0 - 16.0 g/dL   HCT 35.7 35.0 - 47.0 %   MCV 91.5 80.0 - 100.0 fL   MCH 31.7 26.0 - 34.0 pg   MCHC 34.6 32.0 - 36.0 g/dL   RDW 13.5 11.5 - 14.5 %   Platelets 299 150 - 440 K/uL  Basic metabolic panel     Status: Abnormal   Collection Time: 04/22/15  5:13 AM  Result Value Ref Range   Sodium 135 135 - 145 mmol/L   Potassium 3.5 3.5 - 5.1 mmol/L   Chloride 107 101 - 111 mmol/L   CO2 22 22 - 32 mmol/L   Glucose, Bld 90 65 - 99 mg/dL   BUN <5 (L) 6 - 20 mg/dL   Creatinine, Ser 0.68 0.44 - 1.00 mg/dL   Calcium 8.5 (L) 8.9 - 10.3 mg/dL   GFR calc non Af Amer >60 >60 mL/min   GFR calc Af Amer >60 >60 mL/min    Comment: (NOTE) The eGFR has been calculated using the CKD EPI equation. This calculation has not been validated in all clinical situations. eGFR's persistently <60 mL/min signify possible Chronic Kidney Disease.    Anion gap 6 5 - 15  TSH     Status: Abnormal   Collection Time: 04/22/15  1:41 PM  Result Value Ref Range   TSH 10.383 (H) 0.350 - 4.500 uIU/mL  Vitamin B12     Status: None   Collection Time: 04/22/15  1:41 PM  Result Value Ref Range   Vitamin B-12 491 180 - 914 pg/mL    Comment: (NOTE) This assay is not validated for testing neonatal or myeloproliferative syndrome specimens for Vitamin B12 levels. Performed at [facility]   CBC     Status: None   Collection Time: 04/23/15  5:18 AM  Result Value Ref Range   WBC 6.4 3.6 - 11.0 K/uL   RBC 3.98 3.80 - 5.20 MIL/uL   Hemoglobin 12.4 12.0 - 16.0 g/dL   HCT 35.4 35.0 - 47.0 %  MCV 89.0 80.0 - 100.0 fL   MCH 31.2 26.0 - 34.0 pg   MCHC 35.0 32.0 - 36.0 g/dL   RDW 13.1 11.5 - 14.5 %   Platelets 325 150 - 440 K/uL  Magnesium     Status: None    Collection Time: 04/23/15  5:18 AM  Result Value Ref Range   Magnesium 1.8 1.7 - 2.4 mg/dL  Magnesium     Status: Abnormal   Collection Time: 04/23/15 10:49 AM  Result Value Ref Range   Magnesium 1.6 (L) 1.7 - 2.4 mg/dL  Basic metabolic panel     Status: Abnormal   Collection Time: 04/23/15 10:49 AM  Result Value Ref Range   Sodium 129 (L) 135 - 145 mmol/L   Potassium 2.8 (LL) 3.5 - 5.1 mmol/L    Comment: CRITICAL RESULT CALLED TO, READ BACK BY AND VERIFIED WITH CALLED CHRIS BENNETT'@1146'$  ON 04/23/15 BY HKP    Chloride 103 101 - 111 mmol/L   CO2 17 (L) 22 - 32 mmol/L   Glucose, Bld 156 (H) 65 - 99 mg/dL   BUN <5 (L) 6 - 20 mg/dL   Creatinine, Ser 0.81 0.44 - 1.00 mg/dL   Calcium 8.1 (L) 8.9 - 10.3 mg/dL   GFR calc non Af Amer >60 >60 mL/min   GFR calc Af Amer >60 >60 mL/min    Comment: (NOTE) The eGFR has been calculated using the CKD EPI equation. This calculation has not been validated in all clinical situations. eGFR's persistently <60 mL/min signify possible Chronic Kidney Disease.    Anion gap 9 5 - 15    Current Facility-Administered Medications  Medication Dose Route Frequency Provider Last Rate Last Dose  . 0.9 %  sodium chloride infusion   Intravenous Continuous Manya Silvas, MD      . 0.9 %  sodium chloride infusion   Intravenous Continuous Bettey Costa, MD 75 mL/hr at 04/23/15 1254    . acetaminophen (TYLENOL) tablet 650 mg  650 mg Oral Q6H PRN Lance Coon, MD       Or  . acetaminophen (TYLENOL) suppository 650 mg  650 mg Rectal Q6H PRN Lance Coon, MD      . alum & mag hydroxide-simeth (MAALOX/MYLANTA) 200-200-20 MG/5ML suspension 30 mL  30 mL Oral Q6H PRN Bettey Costa, MD      . amLODipine (NORVASC) tablet 2.5 mg  2.5 mg Oral Daily Lance Coon, MD   2.5 mg at 04/23/15 1040  . ciprofloxacin (CIPRO) IVPB 400 mg  400 mg Intravenous Q12H Lance Coon, MD   400 mg at 04/23/15 1408  . cycloSPORINE (RESTASIS) 0.05 % ophthalmic emulsion 1 drop  1 drop Both Eyes BID  Hillary Bow, MD   1 drop at 04/23/15 1042  . divalproex (DEPAKOTE SPRINKLE) capsule 250 mg  250 mg Oral TID Gonzella Lex, MD   250 mg at 04/23/15 1700  . feeding supplement (BOOST / RESOURCE BREEZE) liquid 1 Container  1 Container Oral TID BM Bettey Costa, MD   1 Container at 04/23/15 1400  . HYDROcodone-acetaminophen (NORCO/VICODIN) 5-325 MG per tablet 1 tablet  1 tablet Oral Q6H PRN Lance Coon, MD      . levothyroxine (SYNTHROID, LEVOTHROID) tablet 100 mcg  100 mcg Oral QAC breakfast Bettey Costa, MD   100 mcg at 04/23/15 1040  . LORazepam (ATIVAN) tablet 1 mg  1 mg Oral Q6H PRN Gonzella Lex, MD   1 mg at 04/23/15 1726  . magnesium hydroxide (MILK OF  MAGNESIA) suspension 30 mL  30 mL Oral BID Bettey Costa, MD   30 mL at 04/23/15 1045  . medroxyPROGESTERone (PROVERA) tablet 10 mg  10 mg Oral Daily Rubie Maid, MD   10 mg at 04/23/15 1045  . metroNIDAZOLE (FLAGYL) IVPB 500 mg  500 mg Intravenous Q8H Lance Coon, MD   500 mg at 04/23/15 0528  . ondansetron (ZOFRAN) tablet 4 mg  4 mg Oral Q6H PRN Lance Coon, MD       Or  . ondansetron [facility]) injection 4 mg  4 mg Intravenous Q6H PRN Lance Coon, MD   4 mg at 04/23/15 0814  . [START ON 04/24/2015] pantoprazole (PROTONIX) EC tablet 40 mg  40 mg Oral BID AC Manya Silvas, MD      . PARoxetine (PAXIL) tablet 30 mg  30 mg Oral Daily Lance Coon, MD   30 mg at 04/23/15 1040  . potassium chloride SA (K-DUR,KLOR-CON) CR tablet 40 mEq  40 mEq Oral Q4H PRN Bettey Costa, MD   40 mEq at 04/23/15 1225  . sodium chloride flush (NS) 0.9 % injection 3 mL  3 mL Intravenous Q12H Lance Coon, MD   3 mL at 04/23/15 1000  . zolpidem (AMBIEN) tablet 5 mg  5 mg Oral QHS PRN Lance Coon, MD   5 mg at 04/23/15 0036    Musculoskeletal: Strength & Muscle Tone: within normal limits Gait & Station: unable to stand Patient leans: N/A  Psychiatric Specialty Exam: Review of Systems  HENT: Negative.   Eyes: Negative.   Respiratory: Negative.   Cardiovascular:  Negative.   Gastrointestinal: Positive for nausea.  Musculoskeletal: Negative.   Skin: Negative.   Neurological: Positive for weakness.  Psychiatric/Behavioral: Negative for depression, suicidal ideas, hallucinations, memory loss and substance abuse. The patient is nervous/anxious and has insomnia.     Blood pressure 134/81, pulse 85, temperature 98.7 F (37.1 C), temperature source Oral, resp. rate 18, height 5' (1.524 m), weight 55.021 kg (121 lb 4.8 oz), SpO2 98 %.Body mass index is 23.69 kg/(m^2).  General Appearance: Disheveled  Eye Contact::  Good  Speech:  Clear and Coherent  Volume:  Increased  Mood:  Euthymic  Affect:  Full Range  Thought Process:  Tangential  Orientation:  Full (Time, Place, and Person)  Thought Content:  Negative  Suicidal Thoughts:  No  Homicidal Thoughts:  No  Memory:  Immediate;   Good Recent;   Good Remote;   Good  Judgement:  Good  Insight:  Fair  Psychomotor Activity:  Normal  Concentration:  Fair  Recall:  Streator of Knowledge:Fair  Language: Good  Akathisia:  No  Handed:  Right  AIMS (if indicated):     Assets:  Communication Skills Desire for Improvement Financial Resources/Insurance Housing Resilience Social Support  ADL's:  Intact  Cognition: WNL  Sleep:      Treatment Plan Summary: Medication management and Plan 65 year old woman with a history of bipolar disorder. Although she has some tangentiality to her thinking and her speech can border on pressured at times I don't think she's having a mania. No sign of psychosis. She is in control of her affect. She is behaving in a rational manner. We discussed ways that we can keep her on her medicine even with her GI complaints. Patient had not been aware that Depakote cane in the sprinkle form. I will change her Depakote dose to 250 mg 3 times a day given as Depakote sprinkles which can be placed  on applesauce or similar food. He did take her Ambien 5 mg at night. Continue to take her  Paxil. No need to change any of the medicine. Patient agrees to the plan. I will sign this out to the psychiatrist on call over the weekend.  Disposition: Patient does not meet criteria for psychiatric inpatient admission. Supportive therapy provided about ongoing stressors.  Alethia Berthold, MD 04/23/2015 6:39 PM

## 2015-04-24 ENCOUNTER — Inpatient Hospital Stay: Payer: Medicare Other

## 2015-04-24 LAB — CBC
HEMATOCRIT: 34.6 % — AB (ref 35.0–47.0)
Hemoglobin: 12 g/dL (ref 12.0–16.0)
MCH: 31.4 pg (ref 26.0–34.0)
MCHC: 34.7 g/dL (ref 32.0–36.0)
MCV: 90.4 fL (ref 80.0–100.0)
PLATELETS: 297 10*3/uL (ref 150–440)
RBC: 3.83 MIL/uL (ref 3.80–5.20)
RDW: 13.3 % (ref 11.5–14.5)
WBC: 6.9 10*3/uL (ref 3.6–11.0)

## 2015-04-24 LAB — BASIC METABOLIC PANEL
Anion gap: 5 (ref 5–15)
BUN: 6 mg/dL (ref 6–20)
CALCIUM: 8 mg/dL — AB (ref 8.9–10.3)
CO2: 20 mmol/L — AB (ref 22–32)
CREATININE: 0.64 mg/dL (ref 0.44–1.00)
Chloride: 105 mmol/L (ref 101–111)
GFR calc non Af Amer: >60 mL/min (ref >60)
Glucose, Bld: 90 mg/dL (ref 65–99)
Potassium: 3.6 mmol/L (ref 3.5–5.1)
SODIUM: 130 mmol/L — AB (ref 135–145)

## 2015-04-24 LAB — SEDIMENTATION RATE: SED RATE: 17 mm/h (ref 0–30)

## 2015-04-24 LAB — VITAMIN B1: VITAMIN B1 (THIAMINE): 119 nmol/L (ref 66.5–200.0)

## 2015-04-24 LAB — MAGNESIUM: Magnesium: 2.2 mg/dL (ref 1.7–2.4)

## 2015-04-24 MED ORDER — FLUCONAZOLE 50 MG PO TABS
150.0000 mg | ORAL_TABLET | Freq: Every day | ORAL | Status: DC
  Administered 2015-04-24 – 2015-04-26 (×3): 150 mg via ORAL
  Filled 2015-04-24 (×2): qty 1
  Filled 2015-04-24: qty 3
  Filled 2015-04-24: qty 1

## 2015-04-24 MED ORDER — METRONIDAZOLE 500 MG PO TABS: 500.0000 mg | ORAL_TABLET | Freq: Three times a day (TID) | ORAL | Status: AC

## 2015-04-24 MED ORDER — DIPHENHYDRAMINE HCL 50 MG/ML IJ SOLN
25.0000 mg | Freq: Once | INTRAMUSCULAR | Status: AC
  Administered 2015-04-24: 03:00:00 25 mg via INTRAMUSCULAR
  Filled 2015-04-24: qty 1

## 2015-04-24 MED ORDER — MEDROXYPROGESTERONE ACETATE 10 MG PO TABS: 10.0000 mg | ORAL_TABLET | Freq: Every day | ORAL | Status: DC

## 2015-04-24 MED ORDER — CIPROFLOXACIN HCL 500 MG PO TABS: 500.0000 mg | ORAL_TABLET | Freq: Two times a day (BID) | ORAL | Status: AC

## 2015-04-24 NOTE — Discharge Instructions (Signed)
Heart healthy diet. °Activity as tolerated. °

## 2015-04-24 NOTE — Progress Notes (Signed)
[place] [facility] [place] NAME: Maryland Osterkamp    MR#:  [id number]  DATE OF BIRTH:  1950/07/12  SUBJECTIVE:   Patient complaining of nausea and weakness. She tolerated soft diet REVIEW OF SYSTEMS:    Review of Systems  Constitutional: Positive for malaise/fatigue. Negative for fever and chills.  HENT: Negative for ear discharge, ear pain, hearing loss, nosebleeds and sore throat.   Eyes: Negative for blurred vision and pain.  Respiratory: Negative for cough, hemoptysis, shortness of breath and wheezing.   Cardiovascular: Negative for chest pain, palpitations and leg swelling.  Gastrointestinal: Negative for abdominal pain,  nausea, vomiting, diarrhea, constipation, blood in stool and melena.  Genitourinary: Negative for dysuria.  Musculoskeletal: Negative for back pain and joint pain.  Neurological: Positive for weakness. Negative for dizziness, tremors, speech change, focal weakness, seizures and headaches.  Endo/Heme/Allergies: Does not bruise/bleed easily.  Psychiatric/Behavioral: Negative for depression, suicidal ideas and hallucinations. The patient is nervous/anxious.     Tolerating Diet:  Clear liquid diet    DRUG ALLERGIES:   Allergies  Allergen Reactions  . Bee Venom Hives  . Darvon [Propoxyphene] Anaphylaxis and Hives  . Peanuts [Peanut Oil] Anaphylaxis and Hives  . Penicillins Hives and Other (See Comments)    Has patient had a PCN reaction causing immediate rash, facial/tongue/throat swelling, SOB or lightheadedness with hypotension: Unsure  Has patient had a PCN reaction causing severe rash involving mucus membranes or skin necrosis: Unsure  Has patient had a PCN reaction that required hospitalization Unsure  Has patient had a PCN reaction occurring within the last 10 years: Unsure  If all of the above answers are "NO", then may proceed with Cephalosporin use.  Marland Kitchen Percocet [Oxycodone-Acetaminophen] Nausea And  Vomiting and Anxiety    Reaction:  Hallucinations   . Sulfa Antibiotics Anaphylaxis, Hives and Itching  . Sulfacetamide Sodium Anaphylaxis, Hives and Itching  . Adhesive [Tape] Hives  . Amikacin Nausea And Vomiting  . Aspirin Hives  . Doxycycline Photosensitivity  . Haloperidol Nausea And Vomiting  . Hymenoptera Venom Preparations Hives  . Ibuprofen Hives and Nausea And Vomiting  . Imipramine Hives  . Silicon Hives  . Ultram [Tramadol] Hives  . Epinephrine Palpitations  . Hydroxyzine Anxiety  . Morphine And Related Nausea And Vomiting, Anxiety and Other (See Comments)    Reaction:  Hallucinations   . Prednisone Nausea And Vomiting, Anxiety and Other (See Comments)    Reaction:  Strange thoughts     VITALS:  Blood pressure 148/72, pulse 61, temperature 97.5 F (36.4 C), temperature source Oral, resp. rate 16, height 5' (1.524 m), weight 55.021 kg (121 lb 4.8 oz), SpO2 100 %.  PHYSICAL EXAMINATION:   Physical Exam  Constitutional: She is oriented to person, place, and time and well-developed, well-nourished, and in no distress. No distress.  HENT:  Head: Normocephalic.  Eyes: No scleral icterus.  Neck: Normal range of motion. Neck supple. No JVD present. No tracheal deviation present.  Cardiovascular: Normal rate, regular rhythm and normal heart sounds.  Exam reveals no gallop and no friction rub.   No murmur heard. Pulmonary/Chest: Effort normal and breath sounds normal. No respiratory distress. She has no wheezes. She has no rales. She exhibits no tenderness.  Abdominal: Soft. Bowel sounds are normal. She exhibits no distension and no mass. There is no tenderness. There is no rebound and no guarding.  Musculoskeletal: Normal range of motion. She exhibits no edema.  Neurological: She is alert  and oriented to person, place, and time.  Skin: Skin is warm. No rash noted. No erythema.  Psychiatric: Affect normal.  Anxious      LABORATORY PANEL:   CBC  Recent Labs Lab  04/24/15 0452  WBC 6.9  HGB 12.0  HCT 34.6*  PLT 297   ------------------------------------------------------------------------------------------------------------------  Chemistries   Recent Labs Lab 04/20/15 1952  04/24/15 0452  NA 134*  < > 130*  K 3.5  < > 3.6  CL 104  < > 105  CO2 24  < > 20*  GLUCOSE 80  < > 90  BUN 5*  < > 6  CREATININE 0.82  < > 0.64  CALCIUM 8.9  < > 8.0*  MG  --   < > 2.2  AST 31  --   --   ALT 25  --   --   ALKPHOS 74  --   --   BILITOT 0.3  --   --   < > = values in this interval not displayed. ------------------------------------------------------------------------------------------------------------------  Cardiac Enzymes  Recent Labs Lab 04/21/15 0422 04/21/15 1242 04/21/15 1918  TROPONINI <0.03 <0.03 0.03   ------------------------------------------------------------------------------------------------------------------  RADIOLOGY:  Dg Abd 2 Views  04/24/2015  CLINICAL DATA:  65 year old female with a history of abdominal pain. Prior CT 04/19/2015 demonstrates colitis EXAM: ABDOMEN - 2 VIEW COMPARISON:  CT 04/19/2015, 04/17/2015 FINDINGS: Large hiatal hernia. Gas within stomach, small bowel, and colon. Gas extends through the length of colon to the rectum. No abnormally distended small bowel or colon. Evidence of persisting soft tissue thickening/ descending colonic wall thickening on the plain film, evident on the comparison CT study. No air-fluid levels. Surgical changes of prior hernia repair. Surgical changes of cholecystectomy. No displaced fracture. IMPRESSION: Nonobstructive bowel gas pattern. Persisting descending colon wall thickening evident on the plain film, compatible with the patient's known colitis. Signed, Dulcy Fanny. Earleen Newport, DO [facility] Electronically Signed   By: Corrie Mckusick D.O.   On: 04/24/2015 09:17     ASSESSMENT AND PLAN:   65 year old female with  essential hypertension who presents with acute colitis and melena.  1. Acute colitis seen on CT scan on April 3, failed outpatient treatment: Continue Flagyl and ciprofloxacin. Colitis is likely ischemic in nature. Plan was for colonoscopy however patient does not want colonoscopy at this time. Appreciate GI consult  2. Melena: Hemoglobin remains stable. Continue PPI. Appreciate GI consult, no plans for intervention at this time.  3. Essential hypertension: Continue Norvasc Blood pressure is acceptable.   4. Hypothyroid: TSH was elevated and therefore Synthroid was increased to 100 mg daily. She needs repeat TFTs in 4-6 weeks.    5. Bipolar: Continue Depakote and Paxil.  She would like psychiatry consultation with Dr. Weber Cooks.  6. Hypokalemia: Resolved after repletion.  * Hyponatremia. Better.  7. Numbness with transient aphasia: MRI/MRA is negative for stroke. Carotid ultrasound shows no hemodynamically significant stenosis. This is not TIA or CVA.  Neurology is recommending prophylactic antiplatelet therapy once medically appropriate.  At this time patient has multiple GI issues and would not be a good candidate for antiplatelet therapy.   Appreciate neurology consultation.  8. Postmenopausal bleeding: This is likely multifactorial as per GYN consult. Continue Provera 10 mg daily for 7 days. She will have outpatient follow-up with GYN to undergo Hysteroscopy D&C   9. Hypoagnesemia: repleted and improved.   Management plans discussed with the patient and she is in agreement.  CODE STATUS:  FULL  TOTAL TIME TAKING CARE OF THIS PATIENT: 33 minutes.   Physical therapy is recommending skilled nursing facility at discharge but social worker is still waiting for PASSR.  POSSIBLE D/C to SNF tomorrow DEPENDING ON CLINICAL CONDITION.   Demetrios Loll M.D on 04/24/2015 at 1:29 PM  Between 7am to 6pm - Pager - [phone] After 6pm go to [email or web address] - password [email or web address] [facility]  Office  [phone]  CC: Primary care physician; Maryland Pink, MD  Note: This dictation was prepared with Dragon dictation along with smaller phrase technology. Any transcriptional errors that result from this process are unintentional.

## 2015-04-24 NOTE — Consult Note (Signed)
Patient now off iv meds, abd film shows some bowel thickening in left colon c/w abnormal findings on CT,  Likely ischemic colitis given onset of pain followed by diarrhea followed by bleeding in the "watershed" area of colon.  I see no reason she now needs further hospitalization since she is now on oral meds  And taking food and meds without problems.  Dr. Weber Cooks note very appreciated.  I will sign off.  Would like for patient to follow up in office in 2 weeks to see Angela Leach, PA.  I will sign off.

## 2015-04-24 NOTE — Progress Notes (Signed)
Pharmacy Consult for electrolyte monitoring/replacment    Recent Labs  04/22/15 0513 04/23/15 0518 04/23/15 1049 04/23/15 1900 04/24/15 0452  NA 135  --  129*  --  130*  K 3.5  --  2.8* 3.5 3.6  CL 107  --  103  --  105  CO2 22  --  17*  --  20*  GLUCOSE 90  --  156*  --  90  BUN <5*  --  <5*  --  6  CREATININE 0.68  --  0.81  --  0.64  CALCIUM 8.5*  --  8.1*  --  8.0*  MG  --  1.8 1.6*  --  2.2   Estimated Creatinine Clearance: 53.1 mL/min (by C-G formula based on Cr of 0.64).   No results for input(s): GLUCAP in the last 72 hours.  Assessment: Patient with PRN order for potassium 49mEq POq4h prn x 2 doses if K < 3.7 (by MD)  4/7 1900 K=3.5  4/8 K 3.6,  Mag 2.2  Plan:  Will follow up electrolytes tomorrow morning  Chinita Greenland PharmD Clinical Pharmacist 04/24/2015 1:58 PM

## 2015-04-24 NOTE — Progress Notes (Signed)
Notified Dr. Bridgett Larsson that pt is c/o abdominal pain and wants IV morphine and c/o vaginal yeast infection, per MD pt can have po narcotic that is already ordered and 150 mg diflucan for yeast infection. Verbal in person.

## 2015-04-25 LAB — C-REACTIVE PROTEIN

## 2015-04-25 LAB — CULTURE, BLOOD (ROUTINE X 2)
Culture: NO GROWTH
Culture: NO GROWTH

## 2015-04-25 LAB — BASIC METABOLIC PANEL
ANION GAP: 4 — AB (ref 5–15)
BUN: 7 mg/dL (ref 6–20)
CALCIUM: 8.2 mg/dL — AB (ref 8.9–10.3)
CO2: 25 mmol/L (ref 22–32)
Chloride: 102 mmol/L (ref 101–111)
Creatinine, Ser: 0.61 mg/dL (ref 0.44–1.00)
GLUCOSE: 97 mg/dL (ref 65–99)
POTASSIUM: 3.8 mmol/L (ref 3.5–5.1)
SODIUM: 131 mmol/L — AB (ref 135–145)

## 2015-04-25 LAB — MAGNESIUM: MAGNESIUM: 2.1 mg/dL (ref 1.7–2.4)

## 2015-04-25 MED ORDER — POLYETHYLENE GLYCOL 3350 17 G PO PACK: 17.0000 g | PACK | Freq: Every day | ORAL | Status: DC | PRN

## 2015-04-25 MED ORDER — LACTULOSE 10 GM/15ML PO SOLN: 20.0000 g | Freq: Two times a day (BID) | ORAL | Status: DC | PRN

## 2015-04-25 NOTE — Progress Notes (Signed)
Pharmacy Consult for electrolyte monitoring/replacment    Recent Labs  04/23/15 1049 04/23/15 1900 04/24/15 0452 04/25/15 0424  NA 129*  --  130* 131*  K 2.8* 3.5 3.6 3.8  CL 103  --  105 102  CO2 17*  --  20* 25  GLUCOSE 156*  --  90 97  BUN <5*  --  6 7  CREATININE 0.81  --  0.64 0.61  CALCIUM 8.1*  --  8.0* 8.2*  MG 1.6*  --  2.2 2.1   Estimated Creatinine Clearance: 53.1 mL/min (by C-G formula based on Cr of 0.61).   No results for input(s): GLUCAP in the last 72 hours.  Assessment: Patient with PRN order for potassium 104mEq POq4h prn x 2 doses if K < 3.7 (by MD)  4/9  K 3.8.  Mag 2.1  Plan:  Will follow up electrolytes tomorrow morning  Chinita Greenland PharmD Clinical Pharmacist 04/25/2015 9:39 AM

## 2015-04-25 NOTE — Progress Notes (Signed)
[place] [facility] [place] NAME: Angela [name]    MR#:  [id number]  DATE OF BIRTH:  [birth year]  SUBJECTIVE:   Patient complaining of nausea, no abdominal pain, vomiting or diarrhea. She tolerated soft diet  REVIEW OF SYSTEMS:    Review of Systems  Constitutional: Positive for malaise/fatigue. Negative for fever and chills.  HENT: Negative for ear discharge, ear pain, hearing loss, nosebleeds and sore throat.   Eyes: Negative for blurred vision and pain.  Respiratory: Negative for cough, hemoptysis, shortness of breath and wheezing.   Cardiovascular: Negative for chest pain, palpitations and leg swelling.  Gastrointestinal:no abdominal pain,  Has nausea, no vomiting, diarrhea, constipation, blood in stool and melena.  Genitourinary: Negative for dysuria.  Musculoskeletal: Negative for back pain and joint pain.  Neurological: Positive for weakness. Negative for dizziness, tremors, speech change, focal weakness, seizures and headaches.  Endo/Heme/Allergies: Does not bruise/bleed easily.  Psychiatric/Behavioral: Negative for depression, suicidal ideas and hallucinations. The patient is nervous/anxious.     Tolerating Diet:  Clear liquid diet    DRUG ALLERGIES:   Allergies  Allergen Reactions  . Bee Venom Hives  . Darvon [Propoxyphene] Anaphylaxis and Hives  . Peanuts [Peanut Oil] Anaphylaxis and Hives  . Penicillins Hives and Other (See Comments)    Has patient had a PCN reaction causing immediate rash, facial/tongue/throat swelling, SOB or lightheadedness with hypotension: Unsure  Has patient had a PCN reaction causing severe rash involving mucus membranes or skin necrosis: Unsure  Has patient had a PCN reaction that required hospitalization Unsure  Has patient had a PCN reaction occurring within the last 10 years: Unsure  If all of the above answers are "NO", then may proceed with Cephalosporin use.  Marland Kitchen Percocet  [Oxycodone-Acetaminophen] Nausea And Vomiting and Anxiety    Reaction:  Hallucinations   . Sulfa Antibiotics Anaphylaxis, Hives and Itching  . Sulfacetamide Sodium Anaphylaxis, Hives and Itching  . Adhesive [Tape] Hives  . Amikacin Nausea And Vomiting  . Aspirin Hives  . Doxycycline Photosensitivity  . Haloperidol Nausea And Vomiting  . Hymenoptera Venom Preparations Hives  . Ibuprofen Hives and Nausea And Vomiting  . Imipramine Hives  . Silicon Hives  . Ultram [Tramadol] Hives  . Epinephrine Palpitations  . Hydroxyzine Anxiety  . Morphine And Related Nausea And Vomiting, Anxiety and Other (See Comments)    Reaction:  Hallucinations   . Prednisone Nausea And Vomiting, Anxiety and Other (See Comments)    Reaction:  Strange thoughts     VITALS:  Blood pressure 116/57, pulse 58, temperature 97.8 F (36.6 C), temperature source Oral, resp. rate 20, height 5' (1.524 m), weight 55.021 kg (121 lb 4.8 oz), SpO2 97 %.  PHYSICAL EXAMINATION:   Physical Exam  Constitutional: She is oriented to person, place, and time and well-developed, well-nourished, and in no distress. No distress.  HENT:  Head: Normocephalic.  Eyes: No scleral icterus.  Neck: Normal range of motion. Neck supple. No JVD present. No tracheal deviation present.  Cardiovascular: Normal rate, regular rhythm and normal heart sounds.  Exam reveals no gallop and no friction rub.   No murmur heard. Pulmonary/Chest: Effort normal and breath sounds normal. No respiratory distress. She has no wheezes. She has no rales. She exhibits no tenderness.  Abdominal: Soft. Bowel sounds are normal. She exhibits no distension and no mass. There is no tenderness. There is no rebound and no guarding.  Musculoskeletal: Normal range of motion. She exhibits no edema.  Neurological: She is alert and oriented to person, place, and time.  Skin: Skin is warm. No rash noted. No erythema.  Psychiatric: Affect normal.  Anxious      LABORATORY  PANEL:   CBC  Recent Labs Lab 04/24/15 0452  WBC 6.9  HGB 12.0  HCT 34.6*  PLT 297   ------------------------------------------------------------------------------------------------------------------  Chemistries   Recent Labs Lab 04/20/15 1952  04/25/15 0424  NA 134*  < > 131*  K 3.5  < > 3.8  CL 104  < > 102  CO2 24  < > 25  GLUCOSE 80  < > 97  BUN 5*  < > 7  CREATININE 0.82  < > 0.61  CALCIUM 8.9  < > 8.2*  MG  --   < > 2.1  AST 31  --   --   ALT 25  --   --   ALKPHOS 74  --   --   BILITOT 0.3  --   --   < > = values in this interval not displayed. ------------------------------------------------------------------------------------------------------------------  Cardiac Enzymes  Recent Labs Lab 04/21/15 0422 04/21/15 1242 04/21/15 1918  TROPONINI <0.03 <0.03 0.03   ------------------------------------------------------------------------------------------------------------------  RADIOLOGY:  Dg Abd 2 Views  04/24/2015  CLINICAL DATA:  65 year old female with a history of abdominal pain. Prior CT 04/19/2015 demonstrates colitis EXAM: ABDOMEN - 2 VIEW COMPARISON:  CT 04/19/2015, 04/17/2015 FINDINGS: Large hiatal hernia. Gas within stomach, small bowel, and colon. Gas extends through the length of colon to the rectum. No abnormally distended small bowel or colon. Evidence of persisting soft tissue thickening/ descending colonic wall thickening on the plain film, evident on the comparison CT study. No air-fluid levels. Surgical changes of prior hernia repair. Surgical changes of cholecystectomy. No displaced fracture. IMPRESSION: Nonobstructive bowel gas pattern. Persisting descending colon wall thickening evident on the plain film, compatible with the patient's known colitis. Signed, Dulcy Fanny. Earleen Newport, DO [facility] Electronically Signed   By: Corrie Mckusick D.O.   On: 04/24/2015 09:17     ASSESSMENT AND PLAN:    65 year old female with essential hypertension who presents with acute colitis and melena.  1. Acute colitis seen on CT scan on April 3, failed outpatient treatment: Continue Flagyl and ciprofloxacin. Colitis is likely ischemic in nature. Plan was for colonoscopy however patient does not want colonoscopy at this time.   2. Melena: Hemoglobin remains stable.  Continue protonix bid. Appreciate GI consult, no plans for intervention at this time.  3. Essential hypertension: Continue Norvasc Blood pressure is acceptable.   4. Hypothyroid: TSH was elevated and therefore Synthroid was increased to 100 mg daily. She needs repeat TFTs in 4-6 weeks.    5. Bipolar: Continue Depakote and Paxil.  She would like psychiatry consultation with Dr. Weber Cooks.  6. Hypokalemia: Resolved after repletion.  * Hyponatremia. Improving.  7. Numbness with transient aphasia: MRI/MRA is negative for stroke. Carotid ultrasound shows no hemodynamically significant stenosis. This is not TIA or CVA.  Neurology is recommending prophylactic antiplatelet therapy once medically appropriate.  At this time patient has multiple GI issues and would not be a good candidate for antiplatelet therapy.   Appreciate neurology consultation.  8. Postmenopausal bleeding: This is likely multifactorial as per GYN consult. Continue Provera 10 mg daily for 7 days. She will have outpatient follow-up with GYN to undergo Hysteroscopy D&C   9. Hypoagnesemia: repleted and improved.   Management plans discussed with the patient and she is in  agreement.  CODE STATUS: FULL  TOTAL TIME TAKING CARE OF THIS PATIENT: 33 minutes.   Physical therapy is recommending skilled nursing facility at discharge but social worker is still waiting for PASSR.  POSSIBLE D/C to SNF tomorrow DEPENDING ON CLINICAL CONDITION.   Demetrios Loll M.D on 04/25/2015 at 1:05 PM  Between 7am to 6pm - Pager - [phone] After 6pm go to [email or web address] - password  [email or web address]  Office  [phone]  CC: Primary care physician; [place] Pink, MD  Note: This dictation was prepared with Dragon dictation along with smaller phrase technology. Any transcriptional errors that result from this process are unintentional.

## 2015-04-25 NOTE — Consult Note (Signed)
Patient complains of severe abd pain but on exam is soft, non reactive to pressure of stethoscope pressed on abd.  No palpable masses or enlarged loops.  I think she is ready for discharge to a facility tomorrow.  I will sign off.  REconsult if needed.

## 2015-04-26 LAB — BASIC METABOLIC PANEL
Anion gap: 4 — ABNORMAL LOW (ref 5–15)
BUN: 10 mg/dL (ref 6–20)
CHLORIDE: 103 mmol/L (ref 101–111)
CO2: 25 mmol/L (ref 22–32)
CREATININE: 0.61 mg/dL (ref 0.44–1.00)
Calcium: 8.2 mg/dL — ABNORMAL LOW (ref 8.9–10.3)
GFR calc Af Amer: >60 mL/min (ref >60)
GLUCOSE: 86 mg/dL (ref 65–99)
POTASSIUM: 3.8 mmol/L (ref 3.5–5.1)
SODIUM: 132 mmol/L — AB (ref 135–145)

## 2015-04-26 LAB — MAGNESIUM: MAGNESIUM: 2.1 mg/dL (ref 1.7–2.4)

## 2015-04-26 MED ORDER — PANTOPRAZOLE SODIUM 40 MG PO TBEC: 40.0000 mg | DELAYED_RELEASE_TABLET | Freq: Two times a day (BID) | ORAL | Status: DC

## 2015-04-26 MED ORDER — MEDROXYPROGESTERONE ACETATE 10 MG PO TABS: 10.0000 mg | ORAL_TABLET | Freq: Every day | ORAL | Status: DC

## 2015-04-26 NOTE — Consult Note (Signed)
[place] Psychiatry Consult   Reason for Consult:  This is a 65 year old woman with a history of bipolar disorder. Consult for assistance in medication management. Referring Physician:  Mody Patient Identification: Angela [name] MRN:  [id number] Principal Diagnosis: Bipolar disorder Diagnosis:   Patient Active Problem List   Diagnosis Date Noted  . Bipolar 1 disorder ([place]) [F31.9] 04/23/2015  . Anxiety state [F41.1] 04/23/2015  . Colitis, acute [K52.9] 04/20/2015  . Melena [K92.1] 04/20/2015  . Accelerated hypertension [I10] 04/20/2015  . TIA (transient ischemic attack) [G45.9] 04/20/2015  . Hypothyroidism [E03.9] 04/20/2015  . GERD (gastroesophageal reflux disease) [K21.9] 04/20/2015  . Chest pain [R07.9] 04/20/2015  . Acute colitis [K52.9] 04/20/2015  . Pelvic pain in female [R10.2] 10/30/2014    Total Time spent with patient: 20 minutes  Subjective:   Angela [name] is a 65 y.o. female patient admitted with "I just can't take those big pills".  Follow-up for 65 year old woman with a history of mood instability. Patient is being discharged today and says she is feeling much better. Mood is stable. Not having any psychotic symptoms. Not feeling overly agitated. Still having some sleep problems.  HPI:  Patient interviewed. Chart reviewed. Patient is well known to me from multiple prior encounters. Labs and vitals reviewed. 65 year old woman with a history of bipolar disorder currently in the hospital because of GI complaints with probable colitis. Patient is anxious about trying to make sure her medicine is dosed right without making her more specific. She says that she cannot stand to swallow the large size pills and she doesn't think they're giving her the right dose anyway. Overall however her mood while nervous is not particularly bad. She is not feeling severely depressed. Not feeling anxious or angry. She hasn't slept well the last couple nights but prior to that was  doing reasonably well at home. Denies any hallucinations. Denies suicidal or homicidal ideation. She had been continuing to take her Depakote 250 mg 3 times a day as prescribed by her outpatient psychiatrist as well as her Paxil and Ambien. Patient developed abdominal pain and bloody diarrhea bringing her into the hospital. Doesn't report any really acute change in her mental health condition.  Social history: Patient lives in a house with several roommates. She is quite pleased with this. Used to be dependent on living in group homes and finds her current independence a great improvement. Has had some recent stresses a close friend of hers died of suicide not too long ago.  Medical history: Multiple medical problems acutely colitis, longer term high blood pressure history of TIA hypothyroidism  Substance abuse history: Doesn't drink doesn't abuse drugs no substance abuse past history. Only cigarettes.  Past Psychiatric History: Patient has a history of bipolar disorder. When she gets manic she tends to become extremely hyper religious and more agitated she does have a history of suicide attempts in the past and has had multiple hospitalizations recently seems to of been pretty stable when she is compliant with her medicine.  Risk to Self: Is patient at risk for suicide?: No Risk to Others:   Prior Inpatient Therapy:   Prior Outpatient Therapy:    Past Medical History:  Past Medical History  Diagnosis Date  . Endometriosis   . Hypothyroidism   . GERD (gastroesophageal reflux disease)   . Bipolar 1 disorder ([place])   . Hypertension   . Hernia, hiatal   . Allergic rhinitis   . Bronchitis   . Endometriosis   .  Otalgia   . Otitis media   . Chronic sinusitis   . skin cancer   . Malignant neoplasm of skin   . Facial pain   . Dizziness   . Blurred vision   . Neuropathy ([place])   . Thyroid disease   . Anemia   . Depression   . Migraine   . Chronic constipation   . Diverticulitis   .  Diverticulosis   . GERD (gastroesophageal reflux disease)     Past Surgical History  Procedure Laterality Date  . Tonsillectomy    . Uteral suspension    . Appendectomy    . Hernia repair    . Laproscopy    . Cholecystectomy, laparoscopic    . Cholecystectomy    . Esophagogastroduodenoscopy (egd) with propofol N/A 03/29/2015    Procedure: ESOPHAGOGASTRODUODENOSCOPY (EGD) WITH PROPOFOL;  Surgeon: Josefine Class, MD;  Location: [facility] ENDOSCOPY;  Service: Endoscopy;  Laterality: N/A;   Family History:  Family History  Problem Relation Age of Onset  . Heart disease Father   . Cancer Mother     lung  . Urolithiasis Neg Hx   . Kidney disease Neg Hx   . Kidney cancer Neg Hx   . Prostate cancer Neg Hx    Family Psychiatric  History: Family history positive for anxiety disorders Social History:  History  Alcohol Use No     History  Drug Use No    Social History   Social History  . Marital Status: Single    Spouse Name: N/A  . Number of Children: N/A  . Years of Education: N/A   Social History Main Topics  . Smoking status: Former Research scientist (life sciences)  . Smokeless tobacco: Never Used  . Alcohol Use: No  . Drug Use: No  . Sexual Activity: Not Currently   Other Topics Concern  . None   Social History Narrative   Additional Social History:    Allergies:   Allergies  Allergen Reactions  . Bee Venom Hives  . Darvon [Propoxyphene] Anaphylaxis and Hives  . Peanuts [Peanut Oil] Anaphylaxis and Hives  . Penicillins Hives and Other (See Comments)    Has patient had a PCN reaction causing immediate rash, facial/tongue/throat swelling, SOB or lightheadedness with hypotension: Unsure  Has patient had a PCN reaction causing severe rash involving mucus membranes or skin necrosis: Unsure  Has patient had a PCN reaction that required hospitalization Unsure  Has patient had a PCN reaction occurring within the last 10 years: Unsure  If all of the above answers are "NO", then may proceed  with Cephalosporin use.  Marland Kitchen Percocet [Oxycodone-Acetaminophen] Nausea And Vomiting and Anxiety    Reaction:  Hallucinations   . Sulfa Antibiotics Anaphylaxis, Hives and Itching  . Sulfacetamide Sodium Anaphylaxis, Hives and Itching  . Adhesive [Tape] Hives  . Amikacin Nausea And Vomiting  . Aspirin Hives  . Doxycycline Photosensitivity  . Haloperidol Nausea And Vomiting  . Hymenoptera Venom Preparations Hives  . Ibuprofen Hives and Nausea And Vomiting  . Imipramine Hives  . Silicon Hives  . Ultram [Tramadol] Hives  . Epinephrine Palpitations  . Hydroxyzine Anxiety  . Morphine And Related Nausea And Vomiting, Anxiety and Other (See Comments)    Reaction:  Hallucinations   . Prednisone Nausea And Vomiting, Anxiety and Other (See Comments)    Reaction:  Strange thoughts     Labs:  Results for orders placed or performed during the hospital encounter of 04/20/15 (from the past 48 hour(s))  Magnesium  Status: None   Collection Time: 04/25/15  4:24 AM  Result Value Ref Range   Magnesium 2.1 1.7 - 2.4 mg/dL  Basic metabolic panel     Status: Abnormal   Collection Time: 04/25/15  4:24 AM  Result Value Ref Range   Sodium 131 (L) 135 - 145 mmol/L   Potassium 3.8 3.5 - 5.1 mmol/L   Chloride 102 101 - 111 mmol/L   CO2 25 22 - 32 mmol/L   Glucose, Bld 97 65 - 99 mg/dL   BUN 7 6 - 20 mg/dL   Creatinine, Ser 0.61 0.44 - 1.00 mg/dL   Calcium 8.2 (L) 8.9 - 10.3 mg/dL   GFR calc non Af Amer >60 >60 mL/min   GFR calc Af Amer >60 >60 mL/min    Comment: (NOTE) The eGFR has been calculated using the CKD EPI equation. This calculation has not been validated in all clinical situations. eGFR's persistently <60 mL/min signify possible Chronic Kidney Disease.    Anion gap 4 (L) 5 - 15  C-reactive protein     Status: None   Collection Time: 04/25/15  4:24 AM  Result Value Ref Range   CRP <0.5 <1.0 mg/dL    Comment: Performed at [facility]  Magnesium     Status: None   Collection  Time: 04/26/15  6:03 AM  Result Value Ref Range   Magnesium 2.1 1.7 - 2.4 mg/dL  Basic metabolic panel     Status: Abnormal   Collection Time: 04/26/15  6:03 AM  Result Value Ref Range   Sodium 132 (L) 135 - 145 mmol/L   Potassium 3.8 3.5 - 5.1 mmol/L   Chloride 103 101 - 111 mmol/L   CO2 25 22 - 32 mmol/L   Glucose, Bld 86 65 - 99 mg/dL   BUN 10 6 - 20 mg/dL   Creatinine, Ser 0.61 0.44 - 1.00 mg/dL   Calcium 8.2 (L) 8.9 - 10.3 mg/dL   GFR calc non Af Amer >60 >60 mL/min   GFR calc Af Amer >60 >60 mL/min    Comment: (NOTE) The eGFR has been calculated using the CKD EPI equation. This calculation has not been validated in all clinical situations. eGFR's persistently <60 mL/min signify possible Chronic Kidney Disease.    Anion gap 4 (L) 5 - 15    Current Facility-Administered Medications  Medication Dose Route Frequency Provider Last Rate Last Dose  . acetaminophen (TYLENOL) tablet 650 mg  650 mg Oral Q6H PRN Lance Coon, MD       Or  . acetaminophen (TYLENOL) suppository 650 mg  650 mg Rectal Q6H PRN Lance Coon, MD      . alum & mag hydroxide-simeth (MAALOX/MYLANTA) 200-200-20 MG/5ML suspension 30 mL  30 mL Oral Q6H PRN Bettey Costa, MD   30 mL at 04/25/15 1554  . amLODipine (NORVASC) tablet 2.5 mg  2.5 mg Oral Daily Lance Coon, MD   2.5 mg at 04/26/15 0909  . ciprofloxacin (CIPRO) IVPB 400 mg  400 mg Intravenous Q12H Lance Coon, MD   400 mg at 04/26/15 1344  . cycloSPORINE (RESTASIS) 0.05 % ophthalmic emulsion 1 drop  1 drop Both Eyes BID Hillary Bow, MD   1 drop at 04/26/15 1142  . divalproex (DEPAKOTE SPRINKLE) capsule 250 mg  250 mg Oral TID Gonzella Lex, MD   250 mg at 04/26/15 1614  . feeding supplement (BOOST / RESOURCE BREEZE) liquid 1 Container  1 Container Oral TID BM Bettey Costa, MD  1 Container at 04/26/15 1047  . fluconazole (DIFLUCAN) tablet 150 mg  150 mg Oral Daily Demetrios Loll, MD   150 mg at 04/26/15 1142  . HYDROcodone-acetaminophen (NORCO/VICODIN) 5-325  MG per tablet 1 tablet  1 tablet Oral Q6H PRN Lance Coon, MD      . lactulose (CHRONULAC) 10 GM/15ML solution 20 g  20 g Oral BID PRN Demetrios Loll, MD      . levothyroxine (SYNTHROID, LEVOTHROID) tablet 100 mcg  100 mcg Oral QAC breakfast Bettey Costa, MD   100 mcg at 04/26/15 0909  . LORazepam (ATIVAN) tablet 1 mg  1 mg Oral Q6H PRN Gonzella Lex, MD   1 mg at 04/26/15 0055  . medroxyPROGESTERone (PROVERA) tablet 10 mg  10 mg Oral Daily Rubie Maid, MD   10 mg at 04/26/15 1143  . metroNIDAZOLE (FLAGYL) IVPB 500 mg  500 mg Intravenous Q8H Lance Coon, MD   500 mg at 04/26/15 1344  . ondansetron (ZOFRAN) tablet 4 mg  4 mg Oral Q6H PRN Lance Coon, MD   4 mg at 04/25/15 2221   Or  . ondansetron [facility]) injection 4 mg  4 mg Intravenous Q6H PRN Lance Coon, MD   4 mg at 04/24/15 1818  . pantoprazole (PROTONIX) EC tablet 40 mg  40 mg Oral BID AC Manya Silvas, MD   40 mg at 04/26/15 1626  . PARoxetine (PAXIL) tablet 30 mg  30 mg Oral Daily Lance Coon, MD   30 mg at 04/26/15 1142  . polyethylene glycol (MIRALAX / GLYCOLAX) packet 17 g  17 g Oral Daily PRN Demetrios Loll, MD      . potassium chloride SA (K-DUR,KLOR-CON) CR tablet 40 mEq  40 mEq Oral Q4H PRN Bettey Costa, MD   40 mEq at 04/23/15 1225  . sodium chloride flush (NS) 0.9 % injection 3 mL  3 mL Intravenous Q12H Lance Coon, MD   3 mL at 04/26/15 1147  . zolpidem (AMBIEN) tablet 5 mg  5 mg Oral QHS PRN Lance Coon, MD   5 mg at 04/24/15 2136    Musculoskeletal: Strength & Muscle Tone: within normal limits Gait & Station: unable to stand Patient leans: N/A  Psychiatric Specialty Exam: Review of Systems  HENT: Negative.   Eyes: Negative.   Respiratory: Negative.   Cardiovascular: Negative.   Gastrointestinal: Positive for nausea.  Musculoskeletal: Negative.   Skin: Negative.   Neurological: Positive for weakness.  Psychiatric/Behavioral: Negative for depression, suicidal ideas, hallucinations, memory loss and substance abuse. The  patient is nervous/anxious and has insomnia.     Blood pressure 120/46, pulse 61, temperature 97.9 F (36.6 C), temperature source Oral, resp. rate 18, height 5' (1.524 m), weight 55.021 kg (121 lb 4.8 oz), SpO2 100 %.Body mass index is 23.69 kg/(m^2).  General Appearance: Disheveled  Eye Contact::  Good  Speech:  Clear and Coherent  Volume:  Increased  Mood:  Euthymic  Affect:  Full Range  Thought Process:  Tangential  Orientation:  Full (Time, Place, and Person)  Thought Content:  Negative  Suicidal Thoughts:  No  Homicidal Thoughts:  No  Memory:  Immediate;   Good Recent;   Good Remote;   Good  Judgement:  Good  Insight:  Fair  Psychomotor Activity:  Normal  Concentration:  Fair  Recall:  [place] of Knowledge:Fair  Language: Good  Akathisia:  No  Handed:  Right  AIMS (if indicated):     Assets:  Communication Skills Desire  for Improvement Financial Resources/Insurance Housing Resilience Social Support  ADL's:  Intact  Cognition: WNL  Sleep:      Treatment Plan Summary: Medication management and Plan affect is upbeat mood is good. Agreeable to going to rehabilitation. Supportive counseling. No change to medication. She can go back to her usual dosing strategy of her medicine at discharge.  Disposition: Patient does not meet criteria for psychiatric inpatient admission. Supportive therapy provided about ongoing stressors.  Alethia Berthold, MD 04/26/2015 7:09 PM

## 2015-04-26 NOTE — Progress Notes (Signed)
Patient discharged by [facility]. Safely taken off unit to [facility]. [facility] at [facility] notified patient en route to facility.

## 2015-04-26 NOTE — Discharge Summary (Signed)
[facility] NAME: Angela [name]    MR#:  [id number]  DATE OF BIRTH:  09-Jul-1950  DATE OF ADMISSION:  04/20/2015 ADMITTING PHYSICIAN: Lance Coon, MD  DATE OF DISCHARGE: 04/26/2015 PRIMARY CARE PHYSICIAN: Maryland Pink, MD    ADMISSION DIAGNOSIS:  Aphasia [R47.01] Hematochezia [K92.1] Melena [K92.1] Colitis [K52.9] Vomiting and diarrhea [R11.10, R19.7] Essential hypertension [I10]   DISCHARGE DIAGNOSIS:   Acute colitis Melena Hyponatremia SECONDARY DIAGNOSIS:   Past Medical History  Diagnosis Date  . Endometriosis   . Hypothyroidism   . GERD (gastroesophageal reflux disease)   . Bipolar 1 disorder ([place])   . Hypertension   . Hernia, hiatal   . Allergic rhinitis   . Bronchitis   . Endometriosis   . Otalgia   . Otitis media   . Chronic sinusitis   . skin cancer   . Malignant neoplasm of skin   . Facial pain   . Dizziness   . Blurred vision   . Neuropathy ([place])   . Thyroid disease   . Anemia   . Depression   . Migraine   . Chronic constipation   . Diverticulitis   . Diverticulosis   . GERD (gastroesophageal reflux disease)     HOSPITAL COURSE:  65 year old female with essential hypertension who presents with acute colitis and melena.  1. Acute colitis seen on CT scan on April 3, failed outpatient treatment: Continue Flagyl and ciprofloxacin. Colitis is likely ischemic in nature. Plan was for colonoscopy however patient does not want colonoscopy at this time.   2. Melena: Hemoglobin remains stable.  Continue protonix bid. Appreciate GI consult, no plans for intervention at this time. Follow-up as outpatient.  3. Essential hypertension: Continue Norvasc Blood pressure is acceptable.  4. Hypothyroid: TSH was elevated and therefore Synthroid was increased to 100 mg daily. She needs repeat TFTs in 4-6 weeks.   5. Bipolar: Continue Depakote and Paxil.  She would like psychiatry  consultation with Dr. Weber Cooks.  6. Hypokalemia: Resolved after repletion.  * Hyponatremia. Improving.  7. Numbness with transient aphasia: MRI/MRA is negative for stroke. Carotid ultrasound shows no hemodynamically significant stenosis. This is not TIA or CVA.  Neurology is recommending prophylactic antiplatelet therapy once medically appropriate.  At this time patient has multiple GI issues and would not be a good candidate for antiplatelet therapy.   Appreciate neurology consultation.  8. Postmenopausal bleeding: This is likely multifactorial as per GYN consult. Continue Provera 10 mg daily for 7 days. She will have outpatient follow-up with GYN to undergo Hysteroscopy D&C   9. Hypoagnesemia: repleted and improved.     DISCHARGE CONDITIONS:   Stable, discharge to SNF today.  CONSULTS OBTAINED:  Treatment Team:  Rubie Maid, MD Gonzella Lex, MD  DRUG ALLERGIES:   Allergies  Allergen Reactions  . Bee Venom Hives  . Darvon [Propoxyphene] Anaphylaxis and Hives  . Peanuts [Peanut Oil] Anaphylaxis and Hives  . Penicillins Hives and Other (See Comments)    Has patient had a PCN reaction causing immediate rash, facial/tongue/throat swelling, SOB or lightheadedness with hypotension: Unsure  Has patient had a PCN reaction causing severe rash involving mucus membranes or skin necrosis: Unsure  Has patient had a PCN reaction that required hospitalization Unsure  Has patient had a PCN reaction occurring within the last 10 years: Unsure  If all of the above answers are "NO", then may proceed with Cephalosporin use.  Marland Kitchen Percocet [Oxycodone-Acetaminophen] Nausea And Vomiting  and Anxiety    Reaction:  Hallucinations   . Sulfa Antibiotics Anaphylaxis, Hives and Itching  . Sulfacetamide Sodium Anaphylaxis, Hives and Itching  . Adhesive [Tape] Hives  . Amikacin Nausea And Vomiting  . Aspirin Hives  . Doxycycline Photosensitivity  . Haloperidol Nausea And Vomiting  . Hymenoptera  Venom Preparations Hives  . Ibuprofen Hives and Nausea And Vomiting  . Imipramine Hives  . Silicon Hives  . Ultram [Tramadol] Hives  . Epinephrine Palpitations  . Hydroxyzine Anxiety  . Morphine And Related Nausea And Vomiting, Anxiety and Other (See Comments)    Reaction:  Hallucinations   . Prednisone Nausea And Vomiting, Anxiety and Other (See Comments)    Reaction:  Strange thoughts     DISCHARGE MEDICATIONS:   Current Discharge Medication List    START taking these medications   Details  medroxyPROGESTERone (PROVERA) 10 MG tablet Take 1 tablet (10 mg total) by mouth daily. Qty: 3 tablet, Refills: 0      CONTINUE these medications which have CHANGED   Details  ciprofloxacin (CIPRO) 500 MG tablet Take 1 tablet (500 mg total) by mouth 2 (two) times daily. Qty: 14 tablet, Refills: 0    metroNIDAZOLE (FLAGYL) 500 MG tablet Take 1 tablet (500 mg total) by mouth 3 (three) times daily. Qty: 21 tablet, Refills: 0    pantoprazole (PROTONIX) 40 MG tablet Take 1 tablet (40 mg total) by mouth 2 (two) times daily before a meal. Qty: 60 tablet, Refills: 0      CONTINUE these medications which have NOT CHANGED   Details  acetaminophen (RA ACETAMINOPHEN) 650 MG CR tablet Take by mouth.    amLODipine (NORVASC) 2.5 MG tablet Take 2.5 mg by mouth daily.    Biotin 5000 MCG CAPS Take 1 capsule by mouth daily.     calcium carbonate (OS-CAL) 600 MG TABS tablet Take 600 mg by mouth 3 (three) times daily with meals.    cetirizine (ZYRTEC) 10 MG tablet Take 10 mg by mouth daily.    Cholecalciferol (VITAMIN D-1000 MAX ST) 1000 units tablet Take by mouth.    conjugated estrogens (PREMARIN) vaginal cream Place 1 Applicatorful vaginally daily.    cycloSPORINE (RESTASIS) 0.05 % ophthalmic emulsion Apply to eye.    divalproex (DEPAKOTE ER) 250 MG 24 hr tablet Take 1 tablet by mouth 3 (three) times daily.    divalproex (DEPAKOTE) 500 MG DR tablet Take 500 mg by mouth 3 (three) times daily.     EPINEPHrine (EPIPEN 2-PAK) 0.3 mg/0.3 mL IJ SOAJ injection Inject 0.3 mg into the skin.    erythromycin ophthalmic ointment 1 application at bedtime.    hydrocortisone (ANUSOL-HC) 2.5 % rectal cream Place 1 application rectally 2 (two) times daily.    Hypromellose (GENTEAL MILD) 0.2 % SOLN Apply to eye.    ipratropium (ATROVENT) 0.03 % nasal spray Place 2 sprays into both nostrils every 12 (twelve) hours.    !! LACTOBACILLUS EXTRA STRENGTH CAPS Take by mouth.    !! LACTOBACILLUS PO Take by mouth.    lactulose (CEPHULAC) 10 G packet Take 1 packet (10 g total) by mouth daily as needed (constipation). Qty: 10 each, Refills: 0    levothyroxine (SYNTHROID, LEVOTHROID) 88 MCG tablet Take 88 mcg by mouth daily before breakfast.    Lifitegrast (XIIDRA) 5 % SOLN Apply to eye.    Linaclotide (LINZESS) 290 MCG CAPS capsule Take 290 mcg by mouth daily.    magnesium hydroxide (MILK OF MAGNESIA) 400 MG/5ML suspension Take by  mouth daily as needed for mild constipation.    Menthol-Methyl Salicylate (ICY HOT EXTRA STRENGTH) 10-30 % CREA Apply topically.    multivitamin-iron-minerals-folic acid (THERAPEUTIC-M) TABS tablet Take 1 tablet by mouth daily.    Omega-3 Krill Oil 1000 MG CAPS Take by mouth.    ondansetron (ZOFRAN-ODT) 8 MG disintegrating tablet Take 8 mg by mouth every 8 (eight) hours as needed for nausea or vomiting.    PARoxetine (PAXIL) 30 MG tablet Take 30 mg by mouth daily.    promethazine (PHENERGAN) 25 MG tablet Take 1 tablet (25 mg total) by mouth every 6 (six) hours as needed for nausea or vomiting. Qty: 10 tablet, Refills: 0    Spirulina 500 MG TABS Take by mouth.    terbinafine (LAMISIL) 1 % cream Apply 1 application topically 2 (two) times daily.    terconazole (TERAZOL 7) 0.4 % vaginal cream Place 1 applicator vaginally at bedtime.    tiZANidine (ZANAFLEX) 4 MG tablet Take 4 mg by mouth every 6 (six) hours as needed for muscle spasms.    triamcinolone cream  (KENALOG) 0.1 % Apply 1 application topically 2 (two) times daily.    zolpidem (AMBIEN) 10 MG tablet Take 10 mg by mouth at bedtime as needed for sleep.     !! - Potential duplicate medications found. Please discuss with provider.    STOP taking these medications     fluconazole (DIFLUCAN) 150 MG tablet      HYDROcodone-acetaminophen (NORCO/VICODIN) 5-325 MG per tablet      lidocaine (XYLOCAINE) 2 % solution      magnesium sulfate (EPSOM SALTS) crystals          DISCHARGE INSTRUCTIONS:    If you experience worsening of your admission symptoms, develop shortness of breath, life threatening emergency, suicidal or homicidal thoughts you must seek medical attention immediately by calling 911 or calling your MD immediately  if symptoms less severe.  You Must read complete instructions/literature along with all the possible adverse reactions/side effects for all the Medicines you take and that have been prescribed to you. Take any new Medicines after you have completely understood and accept all the possible adverse reactions/side effects.   Please note  You were cared for by a hospitalist during your hospital stay. If you have any questions about your discharge medications or the care you received while you were in the hospital after you are discharged, you can call the unit and asked to speak with the hospitalist on call if the hospitalist that took care of you is not available. Once you are discharged, your primary care physician will handle any further medical issues. Please note that NO REFILLS for any discharge medications will be authorized once you are discharged, as it is imperative that you return to your primary care physician (or establish a relationship with a primary care physician if you do not have one) for your aftercare needs so that they can reassess your need for medications and monitor your lab values.    Today   SUBJECTIVE    Abdominal pain.  VITAL SIGNS:   Blood pressure 125/68, pulse 57, temperature 97.9 F (36.6 C), temperature source Oral, resp. rate 18, height 5' (1.524 m), weight 55.021 kg (121 lb 4.8 oz), SpO2 96 %.  I/O:   Intake/Output Summary (Last 24 hours) at 04/26/15 1336 Last data filed at 04/26/15 1031  Gross per 24 hour  Intake    100 ml  Output    350 ml  Net   -  250 ml    PHYSICAL EXAMINATION:  GENERAL:  65 y.o.-year-old patient lying in the bed with no acute distress.  EYES: Pupils equal, round, reactive to light and accommodation. No scleral icterus. Extraocular muscles intact.  HEENT: Head atraumatic, normocephalic. Oropharynx and nasopharynx clear.  NECK:  Supple, no jugular venous distention. No thyroid enlargement, no tenderness.  LUNGS: Normal breath sounds bilaterally, no wheezing, rales,rhonchi or crepitation. No use of accessory muscles of respiration.  CARDIOVASCULAR: S1, S2 normal. No murmurs, rubs, or gallops.  ABDOMEN: Soft, non-tender, non-distended. Bowel sounds present. No organomegaly or mass.  EXTREMITIES: No pedal edema, cyanosis, or clubbing.  NEUROLOGIC: Cranial nerves II through XII are intact. Muscle strength 5/5 in all extremities. Sensation intact. Gait not checked.  PSYCHIATRIC: The patient is alert and oriented x 3.  SKIN: No obvious rash, lesion, or ulcer.   DATA REVIEW:   CBC  Recent Labs Lab 04/24/15 0452  WBC 6.9  HGB 12.0  HCT 34.6*  PLT 297    Chemistries   Recent Labs Lab 04/20/15 1952  04/26/15 0603  NA 134*  < > 132*  K 3.5  < > 3.8  CL 104  < > 103  CO2 24  < > 25  GLUCOSE 80  < > 86  BUN 5*  < > 10  CREATININE 0.82  < > 0.61  CALCIUM 8.9  < > 8.2*  MG  --   < > 2.1  AST 31  --   --   ALT 25  --   --   ALKPHOS 74  --   --   BILITOT 0.3  --   --   < > = values in this interval not displayed.  Cardiac Enzymes  Recent Labs Lab 04/21/15 1918  TROPONINI 0.03    Microbiology Results  Results for orders placed or performed during the hospital encounter  of 04/20/15  Culture, blood (routine x 2)     Status: None   Collection Time: 04/20/15  8:23 PM  Result Value Ref Range Status   Specimen Description BLOOD LEFT FATTY CASTS  Final   Special Requests   Final    BOTTLES DRAWN AEROBIC AND ANAEROBIC  2CCAERO, [employer]   Culture NO GROWTH 5 DAYS  Final   Report Status 04/25/2015 FINAL  Final  Culture, blood (routine x 2)     Status: None   Collection Time: 04/20/15  9:20 PM  Result Value Ref Range Status   Specimen Description BLOOD RIGHT ANTECUBITAL  Final   Special Requests BOTTLES DRAWN AEROBIC AND ANAEROBIC 5ML  Final   Culture NO GROWTH 5 DAYS  Final   Report Status 04/25/2015 FINAL  Final  Gastrointestinal Panel by PCR , Stool     Status: None   Collection Time: 04/21/15  6:40 PM  Result Value Ref Range Status   Campylobacter species NOT DETECTED NOT DETECTED Final   Plesimonas shigelloides NOT DETECTED NOT DETECTED Final   Salmonella species NOT DETECTED NOT DETECTED Final   Yersinia enterocolitica NOT DETECTED NOT DETECTED Final   Vibrio species NOT DETECTED NOT DETECTED Final   Vibrio cholerae NOT DETECTED NOT DETECTED Final   Enteroaggregative E coli (EAEC) NOT DETECTED NOT DETECTED Final   Enteropathogenic E coli (EPEC) NOT DETECTED NOT DETECTED Final   Enterotoxigenic E coli (ETEC) NOT DETECTED NOT DETECTED Final   Shiga like toxin producing E coli (STEC) NOT DETECTED NOT DETECTED Final   E. coli O157 NOT DETECTED NOT DETECTED Final  Shigella/Enteroinvasive E coli (EIEC) NOT DETECTED NOT DETECTED Final   Cryptosporidium NOT DETECTED NOT DETECTED Final   Cyclospora cayetanensis NOT DETECTED NOT DETECTED Final   Entamoeba histolytica NOT DETECTED NOT DETECTED Final   Giardia lamblia NOT DETECTED NOT DETECTED Final   Adenovirus F40/41 NOT DETECTED NOT DETECTED Final   Astrovirus NOT DETECTED NOT DETECTED Final   Norovirus GI/GII NOT DETECTED NOT DETECTED Final   Rotavirus A NOT DETECTED NOT DETECTED Final   Sapovirus (I, II,  IV, and V) NOT DETECTED NOT DETECTED Final    RADIOLOGY:  No results found.      Management plans discussed with the patient, family and they are in agreement.  CODE STATUS:     Code Status Orders        Start     Ordered   04/21/15 0032  Full code   Continuous     04/21/15 0031    Code Status History    Date Active Date Inactive Code Status Order ID Comments User Context   This patient has a current code status but no historical code status.      TOTAL TIME TAKING CARE OF THIS PATIENT: 36 minutes.    Demetrios Loll M.D on 04/26/2015 at 1:36 PM  Between 7am to 6pm - Pager - [phone]  After 6pm go to [email or web address] - password [email or web address] [facility]  [facility]  Office  [phone]  CC: Primary care physician; Maryland Pink, MD

## 2015-04-26 NOTE — Progress Notes (Addendum)
EMS here to pick up pt.  EMS states insurance may not pay due to pt can walk without problems.  Ann Production assistant, radio contacted and offered pt a taxi voucher.  Pt declined EMS and will be transported via taxi.  Pt is A&OX4.  [facility] Mow contacted for transport.  [name], RN

## 2015-04-26 NOTE — Progress Notes (Signed)
Report called to Maudie Mercury, RN at [facility].  Pt's IV removed.  EMS contacted for transport.  [name], RN

## 2015-04-26 NOTE — Progress Notes (Signed)
RN notified me that EMS arrived to transport patient to [facility]. EMS told patient her insurance may not pay for transport. Pt. Stable for transport via taxi per staff. Dr. Weber Cooks in to see patient and he felt it was safe to transport patient via taxi. Taxi vocher issued.

## 2015-04-26 NOTE — Clinical Social Work Note (Signed)
CSW provided bed offers to pt and she chose [facility]. CSW updated facility. PASARR has been received. CSW updated MD. Pt is ready for discharge today. CSW sent facility discharge information and is ready to accept pt. Pt is agreeable to discharge plan. RN will call report and EMS will provide transportation. CSW is signing off as no further needs identified.   Angela [name], MSW, LCSW  Clinical Social Worker  [phone]

## 2015-04-26 NOTE — Clinical Social Work Placement (Signed)
   CLINICAL SOCIAL WORK PLACEMENT  NOTE  Date:  04/26/2015  Patient Details  Name: Angela [name] MRN: [id number] Date of Birth: Aug 20, 1950  Clinical Social Work is seeking post-discharge placement for this patient at the [place] level of care (*CSW will initial, date and re-position this form in  chart as items are completed):  Yes   Patient/family provided with [facility] Work Department's list of facilities offering this level of care within the geographic area requested by the patient (or if unable, by the patient's family).      Patient/family informed of their freedom to choose among providers that offer the needed level of care, that participate in Medicare, Medicaid or managed care program needed by the patient, have an available bed and are willing to accept the patient.  Yes   Patient/family informed of [facility]'s ownership interest in [facility] and [facility] as well as of the fact that they are under no obligation to receive care at these facilities.  PASRR submitted to EDS on 04/23/15     PASRR number received on 04/26/15     Existing PASRR number confirmed on       FL2 transmitted to all facilities in geographic area requested by pt/family on 04/23/15     FL2 transmitted to all facilities within larger geographic area on       Patient informed that his/her managed care company has contracts with or will negotiate with certain facilities, including the following:        Yes   Patient/family informed of bed offers received.  Patient chooses bed at [facility]     Physician recommends and patient chooses bed at  [facility])    Patient to be transferred to [facility] [place] on 04/26/15.  Patient to be transferred to facility by [facility]     Patient family notified on 04/26/15 of transfer.  Name of family member notified:  patient only, pt notified friend     PHYSICIAN       Additional  Comment:    _______________________________________________ Darden Dates, LCSW 04/26/2015, 3:53 PM

## 2015-04-26 NOTE — Care Management Important Message (Signed)
Important Message  Patient Details  Name: Angela [name] MRN: [id number] Date of Birth: [birth year]   Medicare Important Message Given:  Yes    Juliann Pulse A Latoia Eyster 04/26/2015, 10:43 AM

## 2015-04-27 ENCOUNTER — Telehealth: Payer: Self-pay | Admitting: Obstetrics and Gynecology

## 2015-04-27 NOTE — Telephone Encounter (Signed)
Angela [name] was in the hospital for a week, she has questions about some labs you may have ordered and said she has a yeast infection, itchy and very smelly.

## 2015-04-28 MED ORDER — TERCONAZOLE 0.4 % VA CREA: 1.0000 | TOPICAL_CREAM | Freq: Every day | VAGINAL | Status: DC

## 2015-04-28 NOTE — Telephone Encounter (Signed)
Please inform patient that I specifically did not order any particular labs for her in the hospital. She was seen by several different doctors in the hospital and so may need to contact the primary doctor listed on her discharge information sheet.  I can however call in something for a yeast infection.  Will send in Terazole cream (i recall that patient was having difficulty tolerating pills in the hospital).

## 2015-04-29 ENCOUNTER — Other Ambulatory Visit: Payer: Self-pay

## 2015-04-29 DIAGNOSIS — B373 Candidiasis of vulva and vagina: Secondary | ICD-10-CM

## 2015-04-29 DIAGNOSIS — B3731 Acute candidiasis of vulva and vagina: Secondary | ICD-10-CM

## 2015-04-29 DIAGNOSIS — N952 Postmenopausal atrophic vaginitis: Secondary | ICD-10-CM

## 2015-04-29 MED ORDER — FLUCONAZOLE 150 MG PO TABS: 150.0000 mg | ORAL_TABLET | ORAL | Status: DC | PRN

## 2015-04-29 MED ORDER — ESTROGENS, CONJUGATED 0.625 MG/GM VA CREA: TOPICAL_CREAM | VAGINAL | Status: DC

## 2015-04-29 NOTE — Telephone Encounter (Signed)
Called pt no answer. Unable to leave message as voicemail is full.  

## 2015-05-18 ENCOUNTER — Ambulatory Visit (INDEPENDENT_AMBULATORY_CARE_PROVIDER_SITE_OTHER): Payer: Medicare Other | Admitting: Obstetrics and Gynecology

## 2015-05-18 VITALS — BP 124/79 | HR 81 | Ht 63.0 in | Wt 122.0 lb

## 2015-05-18 DIAGNOSIS — R399 Unspecified symptoms and signs involving the genitourinary system: Secondary | ICD-10-CM | POA: Diagnosis not present

## 2015-05-18 DIAGNOSIS — N950 Postmenopausal bleeding: Secondary | ICD-10-CM | POA: Diagnosis not present

## 2015-05-19 ENCOUNTER — Telehealth: Payer: Self-pay | Admitting: Obstetrics and Gynecology

## 2015-05-19 NOTE — Telephone Encounter (Signed)
Patient would like to know if she can request to add a lab to check for anemia and check her white blood cells. Im pretty sure these are labs that routinely done at pre admit but she wanted to speak with you to talk about this. She is scheduled for surgery on 5/15 and having pre op on 5/9. Thanks

## 2015-05-20 ENCOUNTER — Telehealth: Payer: Self-pay | Admitting: Obstetrics and Gynecology

## 2015-05-20 DIAGNOSIS — Z113 Encounter for screening for infections with a predominantly sexual mode of transmission: Secondary | ICD-10-CM

## 2015-05-20 DIAGNOSIS — Z01818 Encounter for other preprocedural examination: Secondary | ICD-10-CM

## 2015-05-20 NOTE — Telephone Encounter (Signed)
Patient called and requested to add a couple more labs.  Thanks

## 2015-05-20 NOTE — Telephone Encounter (Signed)
Called pt she states that she believes she may be too sick for her upcoming surgery on the 15th of May. Pt states she is going to see her PCP on Monday and make a decision thereafter. Advised pt to call the office as soon as she has made a decision. Pt states she also wants labs drawn. Encouraged pt to have these drawn at her PCPs office.

## 2015-05-22 ENCOUNTER — Encounter: Payer: Self-pay | Admitting: Obstetrics and Gynecology

## 2015-05-22 DIAGNOSIS — Z9181 History of falling: Secondary | ICD-10-CM | POA: Insufficient documentation

## 2015-05-22 NOTE — Progress Notes (Signed)
    GYNECOLOGY PROGRESS NOTE  Subjective:    Patient ID: Angela [name], female    DOB: [birth year], 65 y.o.   MRN: [id number]  Pelvic Pain The patient's primary symptoms include pelvic pain and vaginal bleeding.    Patient is a 65 y.o. [id number] female who presents for follow up of recent hospitalization approximately 3 weeks ago for ischemic colitis.  Patient was also noted to have episodes of vaginal bleeding while inpatient.  Patient previously evaluated for persistent episodic postmenopausal vaginal spotting/bleeding.  Was treated with Provera tablets while inpatient.  Patient notes that she is still taking the Provera. Notes that she has not resumed Premarin cream. Is complaining of pelvic cramping and discomfort.  Also notes increased urination frequency and occasional discomfort with urination.    The following portions of the patient's history were reviewed and updated as appropriate: allergies, current medications, past family history, past medical history, past social history, past surgical history and problem list.  Review of Systems Pertinent items noted in HPI and remainder of comprehensive ROS otherwise negative.  Objective:   Blood pressure 124/79, pulse 81, height 5\' 3"  (1.6 m), weight 122 lb (55.339 kg). General appearance: alert and no distress Abdomen: soft, non-tender; bowel sounds normal; no masses,  no organomegaly Pelvic: deferred Extremities: extremities normal, atraumatic, no cyanosis or edema Neurologic: Grossly normal     Assessment:   PMB  UTI symptoms   Plan:   -  Patient now ready to proceed with scheduling Hysteroscopy D&C (previously had planned on traveling this month, but since hospitalization and rehab therapy, no longer plans to leave).    Pre-operative exam performed today.  The risks of surgery were discussed in detail with the patient including but not limited to: bleeding which may require transfusion or reoperation; infection which may  require prolonged hospitalization or re-hospitalization and antibiotic therapy; injury to bowel, bladder, ureters and major vessels or other surrounding organs; need for additional procedures including laparotomy; thromboembolic phenomenon, incisional problems and other postoperative or anesthesia complications.  The postoperative expectations were also discussed in detail. The patient also understands the alternative treatment options which were discussed in full. All questions were answered.  She was told that she will be contacted by our surgical scheduler regarding the time and date of her surgery; routine preoperative instructions of having nothing to eat or drink after midnight on the day prior to surgery and also coming to the hospital 1.5 hours prior to her time of surgery were also emphasized.  She was told she may be called for a preoperative appointment about a week prior to surgery and will be given further preoperative instructions at that visit.  Surgery scheduled for 05/31/2015.  - UA negative today, no signs of UTI.  Patient informed.    Angela Maid, MD [facility]

## 2015-05-22 NOTE — H&P (Signed)
GYNECOLOGY PRE-OPERATIVE HISTORY AND PHYSICAL  Subjective:    Patient is a 65 y.o. [id number] female scheduled for hysteroscopy D&C. Indications for procedure are persistent postmenopausal bleeding.   Pertinent Gynecological History: Menses: post-menopausal Bleeding: post menopausal bleeding Contraception: post menopausal status Blood transfusions: none Sexually transmitted diseases: no past history Previous GYN Procedures: D&C Last mammogram: normal Date: 04/2013 Last pap: normal Date: 2014 OB History: G1, P0  Discussed Blood/Blood Products: yes   Menstrual History: OB History    Gravida Para Term Preterm AB TAB SAB Ectopic Multiple Living   1    1 1     0       Past Medical History  Diagnosis Date  . Endometriosis   . Hypothyroidism   . GERD (gastroesophageal reflux disease)   . Bipolar 1 disorder ([place])   . Hypertension   . Hernia, hiatal   . Allergic rhinitis   . Bronchitis   . Endometriosis   . Otalgia   . Otitis media   . Chronic sinusitis   . skin cancer   . Malignant neoplasm of skin   . Facial pain   . Dizziness   . Blurred vision   . Neuropathy ([place])   . Thyroid disease   . Anemia   . Depression   . Migraine   . Chronic constipation   . Diverticulitis   . Diverticulosis   . GERD (gastroesophageal reflux disease)     Past Surgical History  Procedure Laterality Date  . Tonsillectomy    . Uteral suspension    . Appendectomy    . Hernia repair    . Laproscopy    . Cholecystectomy, laparoscopic    . Cholecystectomy    . Esophagogastroduodenoscopy (egd) with propofol N/A 03/29/2015    Procedure: ESOPHAGOGASTRODUODENOSCOPY (EGD) WITH PROPOFOL;  Surgeon: Josefine Class, MD;  Location: [facility] ENDOSCOPY;  Service: Endoscopy;  Laterality: N/A;    OB History  Gravida Para Term Preterm AB SAB TAB Ectopic Multiple Living  1    1  1    0    # Outcome Date GA Lbr Len/2nd Weight Sex Delivery Anes PTL Lv  1 TAB               Social History    Social History  . Marital Status: Single    Spouse Name: N/A  . Number of Children: N/A  . Years of Education: N/A   Social History Main Topics  . Smoking status: Former Research scientist (life sciences)  . Smokeless tobacco: Never Used  . Alcohol Use: No  . Drug Use: No  . Sexual Activity: Not Currently   Other Topics Concern  . Not on file   Social History Narrative    Family History  Problem Relation Age of Onset  . Heart disease Father   . Cancer Mother     lung  . Urolithiasis Neg Hx   . Kidney disease Neg Hx   . Kidney cancer Neg Hx   . Prostate cancer Neg Hx      (Not in a hospital admission)  Allergies  Allergen Reactions  . Bee Venom Hives  . Darvon [Propoxyphene] Anaphylaxis and Hives  . Peanuts [Peanut Oil] Anaphylaxis and Hives  . Penicillins Hives and Other (See Comments)    Has patient had a PCN reaction causing immediate rash, facial/tongue/throat swelling, SOB or lightheadedness with hypotension: Unsure  Has patient had a PCN reaction causing severe rash involving mucus membranes or skin necrosis: Unsure  Has patient had a PCN  reaction that required hospitalization Unsure  Has patient had a PCN reaction occurring within the last 10 years: Unsure  If all of the above answers are "NO", then may proceed with Cephalosporin use.  Marland Kitchen Percocet [Oxycodone-Acetaminophen] Nausea And Vomiting and Anxiety    Reaction:  Hallucinations   . Sulfa Antibiotics Anaphylaxis, Hives and Itching  . Sulfacetamide Sodium Anaphylaxis, Hives and Itching  . Adhesive [Tape] Hives  . Amikacin Nausea And Vomiting  . Aspirin Hives  . Doxycycline Photosensitivity  . Haloperidol Nausea And Vomiting  . Hymenoptera Venom Preparations Hives  . Ibuprofen Hives and Nausea And Vomiting  . Imipramine Hives  . Silicon Hives  . Ultram [Tramadol] Hives  . Epinephrine Palpitations  . Hydroxyzine Anxiety  . Morphine And Related Nausea And Vomiting, Anxiety and Other (See Comments)    Reaction:   Hallucinations   . Prednisone Nausea And Vomiting, Anxiety and Other (See Comments)    Reaction:  Strange thoughts     Review of Systems Constitutional: No recent fever/chills/sweats Respiratory: No recent cough/bronchitis Cardiovascular: No chest pain Gastrointestinal: No recent nausea/vomiting/diarrhea Genitourinary: No UTI symptoms Hematologic/lymphatic:No history of coagulopathy or recent blood thinner use    Objective:    BP 124/79 mmHg  Pulse 81  Ht 5\' 3"  (1.6 m)  Wt 122 lb (55.339 kg)  BMI 21.62 kg/m2  General:   Normal  Skin:   normal  HEENT:  Normal  Neck:  Supple without Adenopathy or Thyromegaly  Lungs:   Heart:              Breasts:   Abdomen:  Pelvis:  M/S   Extremeties:  Neuro:    clear to auscultation bilaterally   Normal without murmur   Not Examined   soft, non-tender; bowel sounds normal; no masses,  no organomegaly   Exam deferred to OR  No CVAT  Warm/Dry   Normal       Assessment:    Postmenopausal bleeding   Plan:    Counseling: Procedure, risks, reasons, benefits and complications (including injury to bowel, bladder, major blood vessel, ureter, bleeding, possibility of transfusion, infection, or fistula formation) reviewed in detail. Consent signed. Preop testing ordered. Instructions reviewed, including NPO after midnight.  Surgery scheduled for 05/31/2015.     Rubie Maid, MD [facility]

## 2015-05-24 ENCOUNTER — Ambulatory Visit: Payer: Medicare Other | Admitting: Podiatry

## 2015-05-24 NOTE — Telephone Encounter (Signed)
I have already informed this pt that it is at the discretion of her PCP as to what labs they decide to draw, we are only going to draw her pre-op labs.

## 2015-05-25 ENCOUNTER — Encounter: Payer: Self-pay | Admitting: Obstetrics and Gynecology

## 2015-05-25 ENCOUNTER — Inpatient Hospital Stay: Admission: RE | Admit: 2015-05-25 | Payer: Medicare Other | Source: Ambulatory Visit

## 2015-05-25 ENCOUNTER — Telehealth: Payer: Self-pay | Admitting: Obstetrics and Gynecology

## 2015-05-25 DIAGNOSIS — B3731 Acute candidiasis of vulva and vagina: Secondary | ICD-10-CM

## 2015-05-25 DIAGNOSIS — B373 Candidiasis of vulva and vagina: Secondary | ICD-10-CM

## 2015-05-25 MED ORDER — FLUCONAZOLE 150 MG PO TABS: ORAL_TABLET | ORAL | Status: DC

## 2015-05-25 MED ORDER — TERCONAZOLE 0.4 % VA CREA
1.0000 | TOPICAL_CREAM | Freq: Every day | VAGINAL | Status: DC
Start: 2015-05-25 — End: 2015-12-29

## 2015-05-25 MED ORDER — TERCONAZOLE 0.4 % VA CREA
1.0000 | TOPICAL_CREAM | Freq: Every day | VAGINAL | Status: DC
Start: 2015-05-25 — End: 2015-05-25

## 2015-05-25 NOTE — Telephone Encounter (Signed)
Ok so A Goldmann don't understand about her cancelled appt today. She doesn't remember talking to DR. Cherry about her procedure. Also she wants to be abole to pick up all 5 diflucan pills at a time because she has to pay someone to go get it. She wants Terazol also.

## 2015-06-07 ENCOUNTER — Telehealth: Payer: Self-pay | Admitting: Obstetrics and Gynecology

## 2015-06-07 NOTE — Telephone Encounter (Signed)
i couldn't understand her  At all. Her phone was cutting out so bad.

## 2015-06-09 NOTE — Telephone Encounter (Signed)
Called pt she states that she cannot remember why she initially called, but wants to inform that she saw her GI doctor and was given the ok to proceed with surgery on June 5th. Pt states that she has had some vaginal bleeding and cramping and was told by GI to resume iron supplement and provera. Pt has complied. Pt scheduled for surgery June 5th.

## 2015-06-10 ENCOUNTER — Encounter
Admission: RE | Admit: 2015-06-10 | Discharge: 2015-06-10 | Disposition: A | Discharge status: Home / Self Care | Payer: Medicare Other | Source: Ambulatory Visit | Attending: Obstetrics and Gynecology | Admitting: Obstetrics and Gynecology

## 2015-06-10 ENCOUNTER — Telehealth: Payer: Self-pay | Admitting: Obstetrics and Gynecology

## 2015-06-10 DIAGNOSIS — Z01812 Encounter for preprocedural laboratory examination: Secondary | ICD-10-CM | POA: Insufficient documentation

## 2015-06-10 DIAGNOSIS — Z01810 Encounter for preprocedural cardiovascular examination: Secondary | ICD-10-CM | POA: Insufficient documentation

## 2015-06-10 LAB — BASIC METABOLIC PANEL
Anion gap: 11 (ref 5–15)
BUN: 11 mg/dL (ref 6–20)
CALCIUM: 9.1 mg/dL (ref 8.9–10.3)
CHLORIDE: 96 mmol/L — AB (ref 101–111)
CO2: 23 mmol/L (ref 22–32)
Creatinine, Ser: 0.78 mg/dL (ref 0.44–1.00)
GFR calc Af Amer: >60 mL/min (ref >60)
GFR calc non Af Amer: >60 mL/min (ref >60)
GLUCOSE: 67 mg/dL (ref 65–99)
Potassium: 3.4 mmol/L — ABNORMAL LOW (ref 3.5–5.1)
Sodium: 130 mmol/L — ABNORMAL LOW (ref 135–145)

## 2015-06-10 LAB — CBC
HEMATOCRIT: 39.5 % (ref 35.0–47.0)
HEMOGLOBIN: 13.3 g/dL (ref 12.0–16.0)
MCH: 31.3 pg (ref 26.0–34.0)
MCHC: 33.7 g/dL (ref 32.0–36.0)
MCV: 93 fL (ref 80.0–100.0)
Platelets: 289 10*3/uL (ref 150–440)
RBC: 4.25 MIL/uL (ref 3.80–5.20)
RDW: 13.8 % (ref 11.5–14.5)
WBC: 6.3 10*3/uL (ref 3.6–11.0)

## 2015-06-10 NOTE — Telephone Encounter (Signed)
Angela [name] called saying even though she's on her next to the last day of taking Levaquin, she's still experiencing an increased temperature, pain, lower back pain, cramping, and can't find her Falera(sp?). She also has "rosy discharge." On a scale of 1-10 of pain, her number is 15. She's wondering what should be done about this.  In addition, she's wondering if she should go through with the [facility] next week. If so, she also wants to know if she can have an overnight stay. She knows it's a day procedure but she has no one at home to help take care of her. She's unsure of what type of pain she may be in after the procedure and can't take Hydrocodone. She'd really like to stay overnight so she can have assistance in healing and having her meals prepared, etc.  Please call her regarding these things.  Pt's ph# [phone] Thank you.

## 2015-06-10 NOTE — Pre-Procedure Instructions (Signed)
Transcription  Sun Dec 08, 2001  1:45 PM EST Patient: Angela [name], Angela [name]   [id number] ______________________________________________________________________________  ECG Report:  Final    12/08/2001 13:45  ECG (ELECTROCARDIOGRAM STANDARD)  Indication:   PR    QRSD  QT    QTC   AXIS: P     I:40  QRS   T:40  ST    T 147   88    393   415         76    52    66    86    143   67               Normal sinus rhythm, rate  67Nonspecific Anterolateral T abnormalities  Present}compared to ecg recorded on *REQ# [id number]     I have reviewed the EKG and the report and concur with the above findings. Electronically signed by: Christian Mate M.D.  (TP) on 12/09/2001 16:31  [id number]   Status

## 2015-06-10 NOTE — Patient Instructions (Addendum)
Your procedure is scheduled on: Monday 06/21/15 Report to Day Surgery. 2ND FLOOR MEDICAL MALL ENTRANCE To find out your arrival time please call [phone] between 1PM - 3PM on Friday 06/18/15 .  Remember: Instructions that are not followed completely may result in serious medical risk, up to and including death, or upon the discretion of your surgeon and anesthesiologist your surgery may need to be rescheduled.    __X__ 1. Do not eat food or drink liquids after midnight. No gum chewing or hard candies.     __X__ 2. No Alcohol for 24 hours before or after surgery.   ____ 3. Bring all medications with you on the day of surgery if instructed.    __X__ 4. Notify your doctor if there is any change in your medical condition     (cold, fever, infections).     Do not wear jewelry, make-up, hairpins, clips or nail polish.  Do not wear lotions, powders, or perfumes.   Do not shave 48 hours prior to surgery. Men may shave face and neck.  Do not bring valuables to the hospital.    [facility] is not responsible for any belongings or valuables.               Contacts, dentures or bridgework may not be worn into surgery.  Leave your suitcase in the car. After surgery it may be brought to your room.  For patients admitted to the hospital, discharge time is determined by your                treatment team.   Patients discharged the day of surgery will not be allowed to drive home.   Please read over the following fact sheets that you were given:   Surgical Site Infection Prevention   __X__ Take these medicines the morning of surgery with A SIP OF WATER:    1. AMLODIPINE  2. CETIRIZINE  3. DIVALPROEX  4. LEVOTHYROXINE  5. PANTOPRAZOLE  6.PAROXETINE  ____ Fleet Enema (as directed)   ____ Use CHG Soap as directed  __X__ Use inhalers on the day of surgery  ____ Stop metformin 2 days prior to surgery    ____ Take 1/2 of usual insulin dose the night before surgery and none on the morning of  surgery.   ____ Stop Coumadin/Plavix/aspirin on   ____ Stop Anti-inflammatories on    __X__ Stop supplements until after surgery.  BIOTIN FISH OIL  ____ Bring C-Pap to the hospital.

## 2015-06-11 ENCOUNTER — Other Ambulatory Visit: Payer: Self-pay

## 2015-06-11 DIAGNOSIS — D509 Iron deficiency anemia, unspecified: Secondary | ICD-10-CM

## 2015-06-11 MED ORDER — FUSION PLUS PO CAPS: 130.0000 mg | ORAL_CAPSULE | Freq: Every day | ORAL | Status: DC

## 2015-06-11 NOTE — Telephone Encounter (Signed)
Called pt she states that she is having a lot of pain, advised pt that she needed to be evaluated in the ED, pt declines and states that [facility] almost killed her. Pt states she is unable to get to any other ED due to transportation. Pt also wants me to inform provider that she wants her surgery to have an over night stay vs. Same day as she has no one to help care for her at home. Will inform provider.

## 2015-06-15 ENCOUNTER — Ambulatory Visit: Payer: Medicare Other | Admitting: Podiatry

## 2015-06-15 NOTE — Telephone Encounter (Signed)
Please inform patient that we would have to discuss overnight observation status with her insurance company.  We will have Angela [name] contact her tomorrow to discuss another potential surgery date.

## 2015-06-16 ENCOUNTER — Ambulatory Visit: Payer: Medicare Other | Admitting: Podiatry

## 2015-06-16 NOTE — Telephone Encounter (Signed)
Angela [name], have you seen this message? Pt is requesting an overnight stay at the hospital, is this an insurance issue?

## 2015-06-16 NOTE — Telephone Encounter (Signed)
I just saw this. The hospital would have to call upon admission for this, however if her reason for admission is just by request and not medically necessary it will not likely cover it.

## 2015-06-17 ENCOUNTER — Telehealth: Payer: Self-pay | Admitting: Obstetrics and Gynecology

## 2015-06-17 DIAGNOSIS — D509 Iron deficiency anemia, unspecified: Secondary | ICD-10-CM

## 2015-06-17 MED ORDER — FUSION PLUS PO CAPS: 130.0000 mg | ORAL_CAPSULE | Freq: Every day | ORAL | Status: DC

## 2015-06-17 NOTE — Telephone Encounter (Signed)
The pt was never instructed by Dr.Cherry to take iron, her iron levels are well within normal. I did however send in an iron supplement for the patient to the [facility] on [address]. We do not deliver prescriptions.

## 2015-06-17 NOTE — Telephone Encounter (Signed)
Contacted patient, left message for patient regarding need to reschedule surgery as contacted by [facility] pre-op and patient with URI symptoms, recommend that her surgery be postponed until possibly the following week.  Also discussed that patient does not need iron therapy any more as recent labs note Hgb of 13.3 (patient recently requested refill of iron supplements).    Rubie Maid, MD [facility]

## 2015-06-17 NOTE — Telephone Encounter (Signed)
Ms. Benedicto called saying she's been to [facility] on [address] three times and each time she's told they don't have the Rx of Iron for her. She's had to pay three different people to take her there only to be told the Rx order hasn't been sent. She wants to make sure Dr. Marcelline Mates still wants her to take the Iron, if not, please let her know.  She now wants the Rx of Iron to be delivered to her home either today or tomorrow. She wants a phone call regarding this asap if possible.  Pt's ph# [phone] Thank you.

## 2015-06-18 ENCOUNTER — Ambulatory Visit: Payer: Medicare Other | Admitting: Sports Medicine

## 2015-06-21 ENCOUNTER — Encounter: Admission: RE | Payer: Self-pay | Source: Ambulatory Visit

## 2015-06-21 ENCOUNTER — Ambulatory Visit
Admission: RE | Admit: 2015-06-21 | Payer: Medicare Other | Source: Ambulatory Visit | Admitting: Obstetrics and Gynecology

## 2015-06-21 SURGERY — DILATATION AND CURETTAGE /HYSTEROSCOPY
Anesthesia: General

## 2015-06-23 ENCOUNTER — Telehealth: Payer: Self-pay | Admitting: Obstetrics and Gynecology

## 2015-06-23 NOTE — Telephone Encounter (Signed)
Patient called stating her vaginal discharge has started back up, also she is have light spotting. I asked her if she wanted to go ahead and reschedule her surgery but she stated she would have to call me back later because she was going to take a nap. Please Advise

## 2015-06-24 ENCOUNTER — Other Ambulatory Visit: Payer: Self-pay

## 2015-06-24 ENCOUNTER — Ambulatory Visit (INDEPENDENT_AMBULATORY_CARE_PROVIDER_SITE_OTHER): Payer: Medicare Other | Admitting: Gastroenterology

## 2015-06-24 ENCOUNTER — Encounter (INDEPENDENT_AMBULATORY_CARE_PROVIDER_SITE_OTHER): Payer: Self-pay

## 2015-06-24 ENCOUNTER — Encounter: Payer: Self-pay | Admitting: Gastroenterology

## 2015-06-24 VITALS — BP 123/76 | HR 81 | Temp 97.9°F | Ht 63.0 in | Wt 121.0 lb

## 2015-06-24 DIAGNOSIS — K581 Irritable bowel syndrome with constipation: Secondary | ICD-10-CM | POA: Diagnosis not present

## 2015-06-24 DIAGNOSIS — I10 Essential (primary) hypertension: Secondary | ICD-10-CM | POA: Insufficient documentation

## 2015-06-24 DIAGNOSIS — N809 Endometriosis, unspecified: Secondary | ICD-10-CM | POA: Insufficient documentation

## 2015-06-24 DIAGNOSIS — D509 Iron deficiency anemia, unspecified: Secondary | ICD-10-CM | POA: Insufficient documentation

## 2015-06-24 MED ORDER — DICYCLOMINE HCL 10 MG PO CAPS: 10.0000 mg | ORAL_CAPSULE | Freq: Three times a day (TID) | ORAL | Status: DC | PRN

## 2015-06-24 NOTE — Progress Notes (Signed)
Gastroenterology Consultation  Referring Provider:     [place] Pink, MD Primary Care Physician:  Maryland Pink, MD Primary Gastroenterologist:  Dr. Allen Norris     Reason for Consultation:     Irritable bowel syndrome        HPI:   Angela [name] is a 65 y.o. y/o female referred for consultation & management of Irritable bowel syndrome by Dr. [place] Pink, MD.  This patient comes in today with a long-standing history of irritable bowel syndrome with constipation. The patient reports that she is having lower abdominal pains. The patient has been seen by Dr. Candace Cruise in the past and has also been seen by Dr. Vira Agar and Dr.Rein. The patient did have an upper endoscopy for dysphagia that did not show any cause for dysphagia and she was recommended to have a barium swallow which she did not go through with. The patient now comes in with the main concern of lower abdominal pain. She also states that she has been doing well with her bowel movements when she takes Linzess and her MiraLAX. There is no report of any unexplained weight loss. The patient does report that she is under a lot of stress at home with her living in a boarding houses and she states that multiple residents there are against her and keep giving her list of rules that she has to sign off on. She also has questions about her emotional state impacting her bowels and also leaky bowel syndrome.  Past Medical History  Diagnosis Date  . Endometriosis   . Hypothyroidism   . GERD (gastroesophageal reflux disease)   . Bipolar 1 disorder ([place])   . Hypertension   . Hernia, hiatal   . Allergic rhinitis   . Bronchitis   . Endometriosis   . Otalgia   . Otitis media   . Chronic sinusitis   . skin cancer   . Malignant neoplasm of skin   . Facial pain   . Dizziness   . Blurred vision   . Neuropathy ([place])   . Thyroid disease   . Anemia   . Depression   . Migraine   . Chronic constipation   . Diverticulitis   . Diverticulosis   . GERD  (gastroesophageal reflux disease)     Past Surgical History  Procedure Laterality Date  . Tonsillectomy    . Uteral suspension    . Appendectomy    . Hernia repair    . Laproscopy    . Cholecystectomy, laparoscopic    . Cholecystectomy    . Esophagogastroduodenoscopy (egd) with propofol N/A 03/29/2015    Procedure: ESOPHAGOGASTRODUODENOSCOPY (EGD) WITH PROPOFOL;  Surgeon: Josefine Class, MD;  Location: [facility];  Service: Endoscopy;  Laterality: N/A;    Prior to Admission medications   Medication Sig Start Date End Date Taking? Authorizing Provider  acetaminophen (RA ACETAMINOPHEN) 650 MG CR tablet Take by mouth. 02/03/15  Yes Historical Provider, MD  amLODipine (NORVASC) 2.5 MG tablet Take 2.5 mg by mouth daily.   Yes Historical Provider, MD  Biotin 5000 MCG CAPS Take 1 capsule by mouth daily.    Yes Historical Provider, MD  calcium carbonate (OS-CAL) 600 MG TABS tablet Take 600 mg by mouth 3 (three) times daily with meals.   Yes Historical Provider, MD  cetirizine (ZYRTEC) 10 MG tablet Take 10 mg by mouth daily.   Yes Historical Provider, MD  Cholecalciferol (VITAMIN D-1000 MAX ST) 1000 units tablet Take by mouth. 02/03/15  Yes Historical  Provider, MD  conjugated estrogens (PREMARIN) vaginal cream Apply a pea sized amount to the labia and vagina daily as needed 04/29/15  Yes Rubie Maid, MD  cycloSPORINE (RESTASIS) 0.05 % ophthalmic emulsion Apply to eye. 03/31/15 06/29/15 Yes Historical Provider, MD  dicyclomine (BENTYL) 10 MG capsule Take 1 capsule (10 mg total) by mouth 3 (three) times daily as needed for spasms. 06/24/15  Yes Lucilla Lame, MD  divalproex (DEPAKOTE ER) 250 MG 24 hr tablet Take 1 tablet by mouth 3 (three) times daily. 04/08/15  Yes Historical Provider, MD  divalproex (DEPAKOTE) 500 MG DR tablet Take 500 mg by mouth 3 (three) times daily.   Yes Historical Provider, MD  EPINEPHrine (EPIPEN 2-PAK) 0.3 mg/0.3 mL IJ SOAJ injection Inject 0.3 mg into the skin.   Yes  Historical Provider, MD  erythromycin ophthalmic ointment 1 application at bedtime.   Yes Historical Provider, MD  hydrocortisone (ANUSOL-HC) 2.5 % rectal cream Place 1 application rectally 2 (two) times daily.   Yes Historical Provider, MD  Hypromellose (GENTEAL MILD) 0.2 % SOLN Apply to eye.   Yes Historical Provider, MD  ipratropium (ATROVENT) 0.03 % nasal spray Place 2 sprays into both nostrils every 12 (twelve) hours.   Yes Historical Provider, MD  LACTOBACILLUS EXTRA STRENGTH CAPS Take by mouth.   Yes Historical Provider, MD  LACTOBACILLUS PO Take by mouth.   Yes Historical Provider, MD  Lifitegrast Shirley Friar) 5 % SOLN Apply to eye.   Yes Historical Provider, MD  Linaclotide (LINZESS) 290 MCG CAPS capsule Take 290 mcg by mouth daily.   Yes Historical Provider, MD  magnesium hydroxide (MILK OF MAGNESIA) 400 MG/5ML suspension Take by mouth daily as needed for mild constipation.   Yes Historical Provider, MD  medroxyPROGESTERone (PROVERA) 10 MG tablet Take 1 tablet (10 mg total) by mouth daily. 04/26/15  Yes Demetrios Loll, MD  Menthol-Methyl Salicylate (ICY HOT EXTRA STRENGTH) 10-30 % CREA Apply topically.   Yes Historical Provider, MD  multivitamin-iron-minerals-folic acid (THERAPEUTIC-M) TABS tablet Take 1 tablet by mouth daily.   Yes Historical Provider, MD  pantoprazole (PROTONIX) 40 MG tablet Take 1 tablet (40 mg total) by mouth 2 (two) times daily before a meal. 04/26/15  Yes Demetrios Loll, MD  PARoxetine (PAXIL) 30 MG tablet Take 30 mg by mouth daily.   Yes Historical Provider, MD  promethazine (PHENERGAN) 25 MG tablet Take 1 tablet (25 mg total) by mouth every 6 (six) hours as needed for nausea or vomiting. 04/01/15  Yes Jenise V Bacon Menshew, PA-C  ranitidine ([employer] RANITIDINE) 75 MG tablet Take by mouth.   Yes Historical Provider, MD  Spirulina 500 MG TABS Take by mouth.   Yes Historical Provider, MD  terbinafine (LAMISIL) 1 % cream Apply 1 application topically 2 (two) times daily.   Yes Historical  Provider, MD  terconazole (TERAZOL 7) 0.4 % vaginal cream Place 1 applicator vaginally at bedtime. Nightly x 7 nights. 05/25/15  Yes Rubie Maid, MD  tiZANidine (ZANAFLEX) 4 MG tablet Take 4 mg by mouth every 6 (six) hours as needed for muscle spasms.   Yes Historical Provider, MD  triamcinolone cream (KENALOG) 0.1 % Apply 1 application topically 2 (two) times daily.   Yes Historical Provider, MD  zolpidem (AMBIEN) 10 MG tablet Take 10 mg by mouth at bedtime as needed for sleep.   Yes Historical Provider, MD  ESTRING 2 MG vaginal ring Reported on 06/24/2015 04/15/15   Historical Provider, MD  fluconazole (DIFLUCAN) 150 MG tablet Take one tablet  by mouth, may repeat if symptoms do not improve. Patient not taking: Reported on 06/24/2015 05/25/15   Rubie Maid, MD  HYDROcodone-acetaminophen (NORCO/VICODIN) 5-325 MG tablet Take by mouth. Reported on 06/24/2015 08/07/14   Historical Provider, MD  IRON PO Take by mouth every other day. Reported on 06/24/2015    Historical Provider, MD  Iron-FA-B Cmp-C-Biot-Probiotic (FUSION PLUS) CAPS Take 130 mg by mouth daily. Patient not taking: Reported on 06/24/2015 06/17/15   Rubie Maid, MD  lactulose (Ecru) 10 G packet Take 1 packet (10 g total) by mouth daily as needed (constipation). 06/03/14   Orbie Pyo, MD  levothyroxine (SYNTHROID, LEVOTHROID) 88 MCG tablet Take 88 mcg by mouth daily before breakfast. Reported on 06/24/2015    Historical Provider, MD  nitrofurantoin, macrocrystal-monohydrate, (MACROBID) 100 MG capsule Reported on 06/24/2015 05/11/15   Historical Provider, MD  Omega-3 Krill Oil 1000 MG CAPS Take by mouth. Reported on 06/24/2015    Historical Provider, MD  ondansetron (ZOFRAN-ODT) 8 MG disintegrating tablet Take 8 mg by mouth every 8 (eight) hours as needed for nausea or vomiting. Reported on 06/24/2015    Historical Provider, MD    Family History  Problem Relation Age of Onset  . Heart disease Father   . Cancer Mother     lung  . Urolithiasis  Neg Hx   . Kidney disease Neg Hx   . Kidney cancer Neg Hx   . Prostate cancer Neg Hx      Social History  Substance Use Topics  . Smoking status: Former Research scientist (life sciences)  . Smokeless tobacco: Never Used  . Alcohol Use: No    Allergies as of 06/24/2015 - Review Complete 06/24/2015  Allergen Reaction Noted  . Bee venom Hives 01/20/2015  . Darvon [propoxyphene] Anaphylaxis and Hives 01/15/2013  . Peanuts [peanut oil] Anaphylaxis and Hives 01/15/2013  . Penicillins Hives and Other (See Comments) 01/15/2013  . Percocet [oxycodone-acetaminophen] Nausea And Vomiting and Anxiety 01/15/2013  . Sulfa antibiotics Anaphylaxis, Hives, and Itching 01/15/2013  . Sulfacetamide sodium Anaphylaxis, Hives, and Itching 01/20/2015  . Adhesive [tape] Hives 03/26/2015  . Amikacin Nausea And Vomiting 01/15/2013  . Aspirin Hives 01/15/2013  . Doxycycline Photosensitivity 01/20/2015  . Haloperidol Nausea And Vomiting 01/20/2015  . Hymenoptera venom preparations Hives 03/26/2015  . Ibuprofen Hives and Nausea And Vomiting 10/30/2014  . Imipramine Hives 03/26/2015  . Oxycodone-acetaminophen Other (See Comments) 06/24/2015  . Ultram [tramadol] Hives 01/15/2013  . Epinephrine Palpitations 03/26/2015  . Hydroxyzine Anxiety 03/26/2015  . Prednisone Nausea And Vomiting, Anxiety, and Other (See Comments) 01/15/2013    Review of Systems:    All systems reviewed and negative except where noted in HPI.   Physical Exam:  BP 123/76 mmHg  Pulse 81  Temp(Src) 97.9 F (36.6 C) (Oral)  Ht 5\' 3"  (1.6 m)  Wt 121 lb (54.885 kg)  BMI 21.44 kg/m2 No LMP recorded. Patient is postmenopausal. Psych:  Alert and cooperative. Normal mood and affect. General:   Alert,  Well-developed, well-nourished, pleasant and cooperative in NAD Head:  Normocephalic and atraumatic. Eyes:  Sclera clear, no icterus.   Conjunctiva pink. Ears:  Normal auditory acuity. Nose:  No deformity, discharge, or lesions. Mouth:  No deformity or  lesions,oropharynx pink & moist. Neck:  Supple; no masses or thyromegaly. Lungs:  Respirations even and unlabored.  Clear throughout to auscultation.   No wheezes, crackles, or rhonchi. No acute distress. Heart:  Regular rate and rhythm; no murmurs, clicks, rubs, or gallops. Abdomen:  Normal bowel sounds.  No bruits.  Soft, non-tender and non-distended without masses, hepatosplenomegaly or hernias noted.  No guarding or rebound tenderness.  Negative Carnett sign.   Rectal:  Deferred.  Msk:  Symmetrical without gross deformities.  Good, equal movement & strength bilaterally. Pulses:  Normal pulses noted. Extremities:  No clubbing or edema.  No cyanosis. Neurologic:  Alert and oriented x3;  grossly normal neurologically. Skin:  Intact without significant lesions or rashes.  No jaundice. Lymph Nodes:  No significant cervical adenopathy. Psych:  Alert and cooperative. Normal mood and affect.  Imaging Studies: No results found.  Assessment and Plan:   LILIAS DUERR is a 65 y.o. y/o female who has a long history of irritable bowel syndrome and was being followed by Dr. Rayann Heman most recently. She has seen multiple gastrologist the past. The patient has been told that her symptoms are very consistent with irritable bowel syndrome and she should continue her present medication and she will started on dicyclomine 10 mg 3 times a day. The patient has been told that with this dose we have room to go up and down depending on her response to the medication. The patient has been explained the plan and agrees with it.   Note: This dictation was prepared with Dragon dictation along with smaller phrase technology. Any transcriptional errors that result from this process are unintentional.

## 2015-06-25 NOTE — Telephone Encounter (Signed)
Well just let me know when she decides that she wants to reschedule her surgery.

## 2015-06-30 ENCOUNTER — Encounter: Payer: Self-pay | Admitting: Podiatry

## 2015-06-30 ENCOUNTER — Ambulatory Visit (INDEPENDENT_AMBULATORY_CARE_PROVIDER_SITE_OTHER): Payer: Medicare Other | Admitting: Podiatry

## 2015-06-30 VITALS — BP 144/95 | HR 76 | Resp 18

## 2015-06-30 DIAGNOSIS — M79676 Pain in unspecified toe(s): Secondary | ICD-10-CM

## 2015-06-30 DIAGNOSIS — B351 Tinea unguium: Secondary | ICD-10-CM | POA: Diagnosis not present

## 2015-06-30 DIAGNOSIS — M722 Plantar fascial fibromatosis: Secondary | ICD-10-CM | POA: Diagnosis not present

## 2015-06-30 DIAGNOSIS — M79609 Pain in unspecified limb: Principal | ICD-10-CM

## 2015-06-30 NOTE — Progress Notes (Signed)
She presents today for follow-up of her plantar fasciitis bilateral and her painfully elongated toenails.  Objective: Vital signs are stable she is alert and oriented 3. No erythema cellulitis or drainage or odor. She is pain on palpation medially continue tubercle bilateral. She has thick yellow dystrophic with mycotic nails with severe hammertoe deformities.  Assessment: Pain in limb secondary to onychomycosis 1 through 5 bilateral. Plantar fasciitis bilateral.  Plan: Injected the bilateral heels today with Kenalog and local anesthetic only 10 mg was utilized. I also debrided her nails 1 through 5 bilateral.

## 2015-07-06 ENCOUNTER — Other Ambulatory Visit: Payer: Self-pay

## 2015-07-06 ENCOUNTER — Telehealth: Payer: Self-pay | Admitting: Gastroenterology

## 2015-07-06 MED ORDER — DICYCLOMINE HCL 10 MG PO CAPS: 10.0000 mg | ORAL_CAPSULE | Freq: Three times a day (TID) | ORAL | Status: DC | PRN

## 2015-07-06 NOTE — Telephone Encounter (Signed)
Patient left a voice message that she needs a refill on her Bentyl today. She uses [facility] nurse on [facility]

## 2015-07-06 NOTE — Telephone Encounter (Signed)
Refill for Bentyl sent to pt's pharmacy. Left her a vm this was sent.

## 2015-07-26 ENCOUNTER — Telehealth: Payer: Self-pay | Admitting: Gastroenterology

## 2015-07-26 ENCOUNTER — Ambulatory Visit
Admission: EM | Admit: 2015-07-26 | Discharge: 2015-07-26 | Disposition: A | Discharge status: Home / Self Care | Payer: Medicare Other | Attending: Family Medicine | Admitting: Family Medicine

## 2015-07-26 DIAGNOSIS — R300 Dysuria: Secondary | ICD-10-CM

## 2015-07-26 DIAGNOSIS — H6501 Acute serous otitis media, right ear: Secondary | ICD-10-CM | POA: Diagnosis not present

## 2015-07-26 MED ORDER — FLUCONAZOLE 150 MG PO TABS: 150.0000 mg | ORAL_TABLET | Freq: Every day | ORAL | Status: DC

## 2015-07-26 MED ORDER — CEPHALEXIN 500 MG PO CAPS: 500.0000 mg | ORAL_CAPSULE | Freq: Three times a day (TID) | ORAL | Status: DC

## 2015-07-26 NOTE — Telephone Encounter (Signed)
Pt returned call stating she was having a lot of gas and bloating. Asked pt what foods she was eating and her examples were that of high fiber foods which I explained to her does cause a lot of gas. Recommended changing foods for now and to purchase some gas x from the pharmacy to see if this will work. Pt will let me know if this doesn't help.

## 2015-07-26 NOTE — Telephone Encounter (Signed)
LVM for pt to return my call.

## 2015-07-26 NOTE — Discharge Instructions (Signed)

## 2015-07-26 NOTE — Telephone Encounter (Signed)
Patient left a voice message that she has some questions and would like to talk to you

## 2015-07-26 NOTE — ED Provider Notes (Signed)
CSN: [id number]     Arrival date & time 07/26/15  0801 History   First MD Initiated Contact with Patient 07/26/15 0825     Chief Complaint  Patient presents with  . Nasal Congestion   (Consider location/radiation/quality/duration/timing/severity/associated sxs/prior Treatment) Patient is a 65 y.o. female presenting with URI and dysuria. The history is provided by the patient.  URI Presenting symptoms: congestion, ear pain, fatigue and rhinorrhea   Presenting symptoms: no fever   Severity:  Moderate Onset quality:  Sudden Duration:  3 days Timing:  Constant Progression:  Worsening Chronicity:  New Relieved by:  Nothing Ineffective treatments:  OTC medications Associated symptoms: no headaches, no sinus pain and no wheezing   Risk factors: being elderly and sick contacts   Risk factors: no chronic cardiac disease, no chronic kidney disease, no chronic respiratory disease, no diabetes mellitus, no immunosuppression, no recent illness and no recent travel   Dysuria Pain quality:  Aching Pain severity:  Mild Onset quality:  Sudden Duration:  2 days Timing:  Constant Progression:  Worsening Chronicity:  New Recent urinary tract infections: no   Relieved by:  None tried Worsened by:  Nothing tried Ineffective treatments:  None tried Urinary symptoms: frequent urination   Urinary symptoms: no discolored urine, no foul-smelling urine, no hematuria, no hesitancy and no bladder incontinence   Associated symptoms: no abdominal pain, no fever, no flank pain, no nausea, no vaginal discharge and no vomiting   Risk factors: no hx of pyelonephritis, no hx of urolithiasis, no kidney transplant, not pregnant, no recurrent urinary tract infections, no renal cysts, no renal disease, not single kidney and no urinary catheter     Past Medical History  Diagnosis Date  . Endometriosis   . Hypothyroidism   . GERD (gastroesophageal reflux disease)   . Bipolar 1 disorder ([place])   . Hypertension    . Hernia, hiatal   . Allergic rhinitis   . Bronchitis   . Endometriosis   . Otalgia   . Otitis media   . Chronic sinusitis   . skin cancer   . Malignant neoplasm of skin   . Facial pain   . Dizziness   . Blurred vision   . Neuropathy ([place])   . Thyroid disease   . Anemia   . Depression   . Migraine   . Chronic constipation   . Diverticulitis   . Diverticulosis   . GERD (gastroesophageal reflux disease)    Past Surgical History  Procedure Laterality Date  . Tonsillectomy    . Uteral suspension    . Appendectomy    . Hernia repair    . Laproscopy    . Cholecystectomy, laparoscopic    . Cholecystectomy    . Esophagogastroduodenoscopy (egd) with propofol N/A 03/29/2015    Procedure: ESOPHAGOGASTRODUODENOSCOPY (EGD) WITH PROPOFOL;  Surgeon: Josefine Class, MD;  Location: [facility] ENDOSCOPY;  Service: Endoscopy;  Laterality: N/A;   Family History  Problem Relation Age of Onset  . Heart disease Father   . Cancer Mother     lung  . Urolithiasis Neg Hx   . Kidney disease Neg Hx   . Kidney cancer Neg Hx   . Prostate cancer Neg Hx    Social History  Substance Use Topics  . Smoking status: Former Research scientist (life sciences)  . Smokeless tobacco: Never Used  . Alcohol Use: No   OB History    Gravida Para Term Preterm AB TAB SAB Ectopic Multiple Living   1    1  1    0     Review of Systems  Constitutional: Positive for fatigue. Negative for fever.  HENT: Positive for congestion, ear pain and rhinorrhea.   Respiratory: Negative for wheezing.   Gastrointestinal: Negative for nausea, vomiting and abdominal pain.  Genitourinary: Positive for dysuria. Negative for flank pain and vaginal discharge.  Neurological: Negative for headaches.    Allergies  Bee venom; Darvon; Peanuts; Penicillins; Percocet; Sulfa antibiotics; Sulfacetamide sodium; Adhesive; Amikacin; Aspirin; Doxycycline; Haloperidol; Hymenoptera venom preparations; Ibuprofen; Imipramine; Oxycodone-acetaminophen; Ultram;  Epinephrine; Hydroxyzine; and Prednisone  Home Medications   Prior to Admission medications   Medication Sig Start Date End Date Taking? Authorizing Provider  acetaminophen (RA ACETAMINOPHEN) 650 MG CR tablet Take by mouth. 02/03/15   Historical Provider, MD  amLODipine (NORVASC) 2.5 MG tablet Take 2.5 mg by mouth daily.    Historical Provider, MD  Biotin 5000 MCG CAPS Take 1 capsule by mouth daily.     Historical Provider, MD  calcium carbonate (OS-CAL) 600 MG TABS tablet Take 600 mg by mouth 3 (three) times daily with meals.    Historical Provider, MD  cephALEXin (KEFLEX) 500 MG capsule Take 1 capsule (500 mg total) by mouth 3 (three) times daily. 07/26/15   Norval Gable, MD  cetirizine (ZYRTEC) 10 MG tablet Take 10 mg by mouth daily.    Historical Provider, MD  Cholecalciferol (VITAMIN D-1000 MAX ST) 1000 units tablet Take by mouth. 02/03/15   Historical Provider, MD  conjugated estrogens (PREMARIN) vaginal cream Apply a pea sized amount to the labia and vagina daily as needed 04/29/15   Rubie Maid, MD  dicyclomine (BENTYL) 10 MG capsule Take 1 capsule (10 mg total) by mouth 3 (three) times daily as needed for spasms. 07/06/15   Lucilla Lame, MD  divalproex (DEPAKOTE ER) 250 MG 24 hr tablet Take 1 tablet by mouth 3 (three) times daily. 04/08/15   Historical Provider, MD  divalproex (DEPAKOTE) 500 MG DR tablet Take 500 mg by mouth 3 (three) times daily.    Historical Provider, MD  EPINEPHrine (EPIPEN 2-PAK) 0.3 mg/0.3 mL IJ SOAJ injection Inject 0.3 mg into the skin.    Historical Provider, MD  erythromycin ophthalmic ointment 1 application at bedtime.    Historical Provider, MD  ESTRING 2 MG vaginal ring Reported on 06/24/2015 04/15/15   Historical Provider, MD  fluconazole (DIFLUCAN) 150 MG tablet Take 1 tablet (150 mg total) by mouth daily. May repeat in 1 week 07/26/15   Norval Gable, MD  HYDROcodone-acetaminophen (NORCO/VICODIN) 5-325 MG tablet Take by mouth. Reported on 06/24/2015 08/07/14    Historical Provider, MD  hydrocortisone (ANUSOL-HC) 2.5 % rectal cream Place 1 application rectally 2 (two) times daily.    Historical Provider, MD  Hypromellose (GENTEAL MILD) 0.2 % SOLN Apply to eye.    Historical Provider, MD  ipratropium (ATROVENT) 0.03 % nasal spray Place 2 sprays into both nostrils every 12 (twelve) hours.    Historical Provider, MD  IRON PO Take by mouth every other day. Reported on 06/24/2015    Historical Provider, MD  Iron-FA-B Cmp-C-Biot-Probiotic (FUSION PLUS) CAPS Take 130 mg by mouth daily. Patient not taking: Reported on 06/24/2015 06/17/15   Rubie Maid, MD  LACTOBACILLUS EXTRA STRENGTH CAPS Take by mouth.    Historical Provider, MD  LACTOBACILLUS PO Take by mouth.    Historical Provider, MD  lactulose (CEPHULAC) 10 G packet Take 1 packet (10 g total) by mouth daily as needed (constipation). 06/03/14   Orbie Pyo, MD  levothyroxine (SYNTHROID, LEVOTHROID) 88 MCG tablet Take 88 mcg by mouth daily before breakfast. Reported on 06/24/2015    Historical Provider, MD  Lifitegrast Shirley Friar) 5 % SOLN Apply to eye.    Historical Provider, MD  Linaclotide Rolan Lipa) 290 MCG CAPS capsule Take 290 mcg by mouth daily.    Historical Provider, MD  magnesium hydroxide (MILK OF MAGNESIA) 400 MG/5ML suspension Take by mouth daily as needed for mild constipation.    Historical Provider, MD  medroxyPROGESTERone (PROVERA) 10 MG tablet Take 1 tablet (10 mg total) by mouth daily. 04/26/15   Demetrios Loll, MD  Menthol-Methyl Salicylate (ICY HOT EXTRA STRENGTH) 10-30 % CREA Apply topically.    Historical Provider, MD  multivitamin-iron-minerals-folic acid (THERAPEUTIC-M) TABS tablet Take 1 tablet by mouth daily.    Historical Provider, MD  nitrofurantoin, macrocrystal-monohydrate, (MACROBID) 100 MG capsule Reported on 06/24/2015 05/11/15   Historical Provider, MD  Omega-3 Krill Oil 1000 MG CAPS Take by mouth. Reported on 06/24/2015    Historical Provider, MD  ondansetron (ZOFRAN-ODT) 8 MG  disintegrating tablet Take 8 mg by mouth every 8 (eight) hours as needed for nausea or vomiting. Reported on 06/24/2015    Historical Provider, MD  pantoprazole (PROTONIX) 40 MG tablet Take 1 tablet (40 mg total) by mouth 2 (two) times daily before a meal. 04/26/15   Demetrios Loll, MD  PARoxetine (PAXIL) 30 MG tablet Take 30 mg by mouth daily.    Historical Provider, MD  promethazine (PHENERGAN) 25 MG tablet Take 1 tablet (25 mg total) by mouth every 6 (six) hours as needed for nausea or vomiting. 04/01/15   Jenise V Bacon Menshew, PA-C  ranitidine ([employer] RANITIDINE) 75 MG tablet Take by mouth.    Historical Provider, MD  Spirulina 500 MG TABS Take by mouth.    Historical Provider, MD  terbinafine (LAMISIL) 1 % cream Apply 1 application topically 2 (two) times daily.    Historical Provider, MD  terconazole (TERAZOL 7) 0.4 % vaginal cream Place 1 applicator vaginally at bedtime. Nightly x 7 nights. 05/25/15   Rubie Maid, MD  tiZANidine (ZANAFLEX) 4 MG tablet Take 4 mg by mouth every 6 (six) hours as needed for muscle spasms.    Historical Provider, MD  triamcinolone cream (KENALOG) 0.1 % Apply 1 application topically 2 (two) times daily.    Historical Provider, MD  zolpidem (AMBIEN) 10 MG tablet Take 10 mg by mouth at bedtime as needed for sleep.    Historical Provider, MD   Meds Ordered and Administered this Visit  Medications - No data to display  BP 140/72 mmHg  Pulse 67  Temp(Src) 98 F (36.7 C) (Oral)  Resp 18  Ht 5\' 3"  (1.6 m)  Wt 125 lb (56.7 kg)  BMI 22.15 kg/m2  SpO2 100% No data found.   Physical Exam  Constitutional: She appears well-developed and well-nourished. No distress.  HENT:  Head: Normocephalic and atraumatic.  Right Ear: Tympanic membrane, external ear and ear canal normal.  Left Ear: External ear normal. Tympanic membrane is injected, erythematous and bulging.  Nose: Mucosal edema and rhinorrhea present. No nose lacerations, sinus tenderness, nasal deformity, septal  deviation or nasal septal hematoma. No epistaxis.  No foreign bodies. Right sinus exhibits maxillary sinus tenderness and frontal sinus tenderness. Left sinus exhibits maxillary sinus tenderness and frontal sinus tenderness.  Mouth/Throat: Uvula is midline, oropharynx is clear and moist and mucous membranes are normal. No oropharyngeal exudate.  Eyes: Conjunctivae and EOM are normal. Pupils are equal, round, and  reactive to light. Right eye exhibits no discharge. Left eye exhibits no discharge. No scleral icterus.  Neck: Normal range of motion. Neck supple. No thyromegaly present.  Cardiovascular: Normal rate, regular rhythm and normal heart sounds.   Pulmonary/Chest: Effort normal and breath sounds normal. No respiratory distress. She has no wheezes. She has no rales.  Abdominal: Soft. Bowel sounds are normal. She exhibits no distension and no mass. There is no tenderness. There is no rebound and no guarding.  Lymphadenopathy:    She has no cervical adenopathy.  Skin: She is not diaphoretic.  Nursing note and vitals reviewed.   ED Course  Procedures (including critical care time)  Labs Review Labs Reviewed  URINE CULTURE  URINALYSIS COMPLETEWITH MICROSCOPIC ([place])    Imaging Review No results found.   Visual Acuity Review  Right Eye Distance:   Left Eye Distance:   Bilateral Distance:    Right Eye Near:   Left Eye Near:    Bilateral Near:         MDM   1. Right acute serous otitis media, recurrence not specified   2. Dysuria    Discharge Medication List as of 07/26/2015  8:44 AM    START taking these medications   Details  cephALEXin (KEFLEX) 500 MG capsule Take 1 capsule (500 mg total) by mouth 3 (three) times daily., Starting 07/26/2015, Until Discontinued, Normal       1.  diagnosis reviewed with patient 2. rx as per orders above; reviewed possible side effects, interactions, risks and benefits  3. Recommend supportive treatment with increase water  intake; otc analgesics prn 4. Follow-up prn if symptoms worsen or don't improve    Norval Gable, MD 07/26/15 [id number]

## 2015-07-26 NOTE — ED Notes (Addendum)
Patients presents with cold symptoms (nasal drip, exhaustion, congestion, cough) that started yesterday.  Side Note: Her boyfriend just passed away, and she is extremely sad about that and she mentioned to me about bug bites all over her from her bed, but she doesn't feel like she has bed bugs.

## 2015-08-16 ENCOUNTER — Telehealth: Payer: Self-pay | Admitting: Obstetrics and Gynecology

## 2015-08-16 NOTE — Telephone Encounter (Signed)
Pt called and she would like a call back in regards to an exam and surgery.

## 2015-08-18 NOTE — Telephone Encounter (Signed)
Called pt she states that she is in a lot of pain, pt states that when she has not eaten it feels like a dropping, stabbing, in her pelvic region. Pt states that she would like a GYN exam to rule out any problems or concerns. Pt scheduled for 09/08/2015 at 1:30pm.

## 2015-08-20 ENCOUNTER — Emergency Department: Payer: Medicare Other

## 2015-08-20 DIAGNOSIS — J680 Bronchitis and pneumonitis due to chemicals, gases, fumes and vapors: Secondary | ICD-10-CM | POA: Diagnosis not present

## 2015-08-20 DIAGNOSIS — Z87891 Personal history of nicotine dependence: Secondary | ICD-10-CM | POA: Insufficient documentation

## 2015-08-20 DIAGNOSIS — E039 Hypothyroidism, unspecified: Secondary | ICD-10-CM | POA: Insufficient documentation

## 2015-08-20 DIAGNOSIS — Z85828 Personal history of other malignant neoplasm of skin: Secondary | ICD-10-CM | POA: Diagnosis not present

## 2015-08-20 DIAGNOSIS — T6594XA Toxic effect of unspecified substance, undetermined, initial encounter: Secondary | ICD-10-CM | POA: Insufficient documentation

## 2015-08-20 DIAGNOSIS — R0602 Shortness of breath: Secondary | ICD-10-CM | POA: Diagnosis not present

## 2015-08-20 DIAGNOSIS — Z79899 Other long term (current) drug therapy: Secondary | ICD-10-CM | POA: Insufficient documentation

## 2015-08-20 DIAGNOSIS — I10 Essential (primary) hypertension: Secondary | ICD-10-CM | POA: Insufficient documentation

## 2015-08-20 NOTE — ED Triage Notes (Addendum)
Patient brought to stat registration via EMS from local grocery store.  EMS reports that patient had sprayed rid a bug (while wearing a mask) and now with complaints of shortness of breath.  EMS reports vital signs within normal limits, pulse oxi 100% on room air.  Patient reports she sprayed her bathroom at noon and when she got short of breath she called poision control and they told her to leave the house.  Patient reports she called them back later in the evening and they told her to come to the ED.  Patient reports she called a ride and they never showed up so patient arrived by EMS.

## 2015-08-21 ENCOUNTER — Encounter: Payer: Self-pay | Admitting: Emergency Medicine

## 2015-08-21 ENCOUNTER — Emergency Department
Admission: EM | Admit: 2015-08-21 | Discharge: 2015-08-21 | Disposition: A | Discharge status: Home / Self Care | Payer: Medicare Other | Attending: Emergency Medicine | Admitting: Emergency Medicine

## 2015-08-21 DIAGNOSIS — R0602 Shortness of breath: Secondary | ICD-10-CM | POA: Diagnosis not present

## 2015-08-21 DIAGNOSIS — J680 Bronchitis and pneumonitis due to chemicals, gases, fumes and vapors: Secondary | ICD-10-CM

## 2015-08-21 LAB — BASIC METABOLIC PANEL
Anion gap: 11 (ref 5–15)
BUN: 12 mg/dL (ref 6–20)
CALCIUM: 8.9 mg/dL (ref 8.9–10.3)
CO2: 21 mmol/L — ABNORMAL LOW (ref 22–32)
CREATININE: 0.52 mg/dL (ref 0.44–1.00)
Chloride: 97 mmol/L — ABNORMAL LOW (ref 101–111)
GFR calc Af Amer: >60 mL/min (ref >60)
GLUCOSE: 87 mg/dL (ref 65–99)
POTASSIUM: 3.8 mmol/L (ref 3.5–5.1)
Sodium: 129 mmol/L — ABNORMAL LOW (ref 135–145)

## 2015-08-21 LAB — CBC
HEMATOCRIT: 38.1 % (ref 35.0–47.0)
Hemoglobin: 13.1 g/dL (ref 12.0–16.0)
MCH: 32 pg (ref 26.0–34.0)
MCHC: 34.4 g/dL (ref 32.0–36.0)
MCV: 93.3 fL (ref 80.0–100.0)
Platelets: 117 10*3/uL — ABNORMAL LOW (ref 150–440)
RBC: 4.09 MIL/uL (ref 3.80–5.20)
RDW: 13.6 % (ref 11.5–14.5)
WBC: 6.1 10*3/uL (ref 3.6–11.0)

## 2015-08-21 LAB — TROPONIN I: Troponin I: <0.03 ng/mL (ref <0.03)

## 2015-08-21 MED ORDER — ONDANSETRON 4 MG PO TBDP
4.0000 mg | ORAL_TABLET | Freq: Once | ORAL | Status: AC
  Administered 2015-08-21: 4 mg via ORAL
  Filled 2015-08-21: qty 1

## 2015-08-21 MED ORDER — LORAZEPAM 2 MG/ML IJ SOLN
INTRAMUSCULAR | Status: AC
  Administered 2015-08-21: 1 mg via INTRAVENOUS
  Filled 2015-08-21: qty 1

## 2015-08-21 MED ORDER — LORAZEPAM 2 MG/ML IJ SOLN
1.0000 mg | Freq: Once | INTRAMUSCULAR | Status: AC
  Administered 2015-08-21: 1 mg via INTRAVENOUS

## 2015-08-21 NOTE — Discharge Instructions (Signed)
You have not suffered permanent injury to your lungs. Return to the ER for worsening symptoms, persistent vomiting, difficult to breathing or other concerns.

## 2015-08-21 NOTE — ED Notes (Signed)
Called pt's friend Ralene Bathe, states she is in [place] but will try to arrange for transport for pt.  States she will not be able to find immediate transport but may find someone for later this AM.  Day shift nurse to be notified, MD notified.

## 2015-08-21 NOTE — ED Notes (Signed)
Per Dr. Beather Arbour, okay for pt to remain in room until ride is found or until pt is alert enough to be safe in waiting room.

## 2015-08-21 NOTE — ED Notes (Signed)
Pt given food and drink. Ride will be here shortly. Okay to wait in lobby per MD

## 2015-08-21 NOTE — ED Provider Notes (Signed)
[facility] Emergency Department Provider Note   ____________________________________________   First MD Initiated Contact with Patient 08/21/15 [phone]     (approximate)  I have reviewed the triage vital signs and the nursing notes.   HISTORY  Chief Complaint Shortness of Breath    HPI Angela [name] is a 65 y.o. female who presents to the ED via EMS from a grocery store with a chief complaint of shortness of breath.Patient reports she sprayed her bathroom at noon with Rid-X bug spray and inhaled some fumes. States she immediately called poison control instructed her to leave her house. Patient reportedly called poison control back later in the evening for shortness of breath and they directed her to the emergency department for evaluation. Denies fever, chills, abdominal pain, nausea, vomiting, diarrhea. Denies recent travel or trauma. Nothing makes her symptoms better or worse.   Past Medical History:  Diagnosis Date  . Allergic rhinitis   . Anemia   . Bipolar 1 disorder ([place])   . Blurred vision   . Bronchitis   . Chronic constipation   . Chronic sinusitis   . Depression   . Diverticulitis   . Diverticulosis   . Dizziness   . Endometriosis   . Endometriosis   . Facial pain   . GERD (gastroesophageal reflux disease)   . GERD (gastroesophageal reflux disease)   . Hernia, hiatal   . Hypertension   . Hypothyroidism   . Malignant neoplasm of skin   . Migraine   . Neuropathy ([place])   . Otalgia   . Otitis media   . skin cancer   . Thyroid disease     Patient Active Problem List   Diagnosis Date Noted  . Absolute anemia 06/24/2015  . Clinical depression 06/24/2015  . Endometriosis 06/24/2015  . BP (high blood pressure) 06/24/2015  . Headache, migraine 06/24/2015  . Disease of thyroid gland 06/24/2015  . PMB (postmenopausal bleeding) 05/22/2015  . Bipolar 1 disorder ([place]) 04/23/2015  . Anxiety state 04/23/2015  . Colitis, acute  04/20/2015  . Melena 04/20/2015  . Accelerated hypertension 04/20/2015  . TIA (transient ischemic attack) 04/20/2015  . Hypothyroidism 04/20/2015  . GERD (gastroesophageal reflux disease) 04/20/2015  . Chest pain 04/20/2015  . Acute colitis 04/20/2015  . Cervical disc disorder with radiculopathy 03/25/2015  . Cervical pain 03/25/2015  . Pelvic pain in female 10/30/2014  . Bipolar affective disorder ([place]) 06/30/2014  . CA of skin 06/30/2014  . Cephalalgia 12/16/2013  . Neuropathy ([place]) 12/16/2013    Past Surgical History:  Procedure Laterality Date  . APPENDECTOMY    . CHOLECYSTECTOMY    . CHOLECYSTECTOMY, LAPAROSCOPIC    . ESOPHAGOGASTRODUODENOSCOPY (EGD) WITH PROPOFOL N/A 03/29/2015   Procedure: ESOPHAGOGASTRODUODENOSCOPY (EGD) WITH PROPOFOL;  Surgeon: Josefine Class, MD;  Location: [facility] ENDOSCOPY;  Service: Endoscopy;  Laterality: N/A;  . HERNIA REPAIR    . laproscopy    . TONSILLECTOMY    . uteral suspension      Prior to Admission medications   Medication Sig Start Date End Date Taking? Authorizing Provider  acetaminophen (RA ACETAMINOPHEN) 650 MG CR tablet Take by mouth. 02/03/15   Historical Provider, MD  amLODipine (NORVASC) 2.5 MG tablet Take 2.5 mg by mouth daily.    Historical Provider, MD  Biotin 5000 MCG CAPS Take 1 capsule by mouth daily.     Historical Provider, MD  calcium carbonate (OS-CAL) 600 MG TABS tablet Take 600 mg by mouth 3 (three) times daily with  meals.    Historical Provider, MD  cephALEXin (KEFLEX) 500 MG capsule Take 1 capsule (500 mg total) by mouth 3 (three) times daily. 07/26/15   Norval Gable, MD  cetirizine (ZYRTEC) 10 MG tablet Take 10 mg by mouth daily.    Historical Provider, MD  Cholecalciferol (VITAMIN D-1000 MAX ST) 1000 units tablet Take by mouth. 02/03/15   Historical Provider, MD  conjugated estrogens (PREMARIN) vaginal cream Apply a pea sized amount to the labia and vagina daily as needed 04/29/15   Rubie Maid, MD  dicyclomine  (BENTYL) 10 MG capsule Take 1 capsule (10 mg total) by mouth 3 (three) times daily as needed for spasms. 07/06/15   Lucilla Lame, MD  divalproex (DEPAKOTE ER) 250 MG 24 hr tablet Take 1 tablet by mouth 3 (three) times daily. 04/08/15   Historical Provider, MD  divalproex (DEPAKOTE) 500 MG DR tablet Take 500 mg by mouth 3 (three) times daily.    Historical Provider, MD  EPINEPHrine (EPIPEN 2-PAK) 0.3 mg/0.3 mL IJ SOAJ injection Inject 0.3 mg into the skin.    Historical Provider, MD  erythromycin ophthalmic ointment 1 application at bedtime.    Historical Provider, MD  ESTRING 2 MG vaginal ring Reported on 06/24/2015 04/15/15   Historical Provider, MD  fluconazole (DIFLUCAN) 150 MG tablet Take 1 tablet (150 mg total) by mouth daily. May repeat in 1 week 07/26/15   Norval Gable, MD  HYDROcodone-acetaminophen (NORCO/VICODIN) 5-325 MG tablet Take by mouth. Reported on 06/24/2015 08/07/14   Historical Provider, MD  hydrocortisone (ANUSOL-HC) 2.5 % rectal cream Place 1 application rectally 2 (two) times daily.    Historical Provider, MD  Hypromellose (GENTEAL MILD) 0.2 % SOLN Apply to eye.    Historical Provider, MD  ipratropium (ATROVENT) 0.03 % nasal spray Place 2 sprays into both nostrils every 12 (twelve) hours.    Historical Provider, MD  IRON PO Take by mouth every other day. Reported on 06/24/2015    Historical Provider, MD  Iron-FA-B Cmp-C-Biot-Probiotic (FUSION PLUS) CAPS Take 130 mg by mouth daily. Patient not taking: Reported on 06/24/2015 06/17/15   Rubie Maid, MD  LACTOBACILLUS EXTRA STRENGTH CAPS Take by mouth.    Historical Provider, MD  LACTOBACILLUS PO Take by mouth.    Historical Provider, MD  lactulose (CEPHULAC) 10 G packet Take 1 packet (10 g total) by mouth daily as needed (constipation). 06/03/14   Orbie Pyo, MD  levothyroxine (SYNTHROID, LEVOTHROID) 88 MCG tablet Take 88 mcg by mouth daily before breakfast. Reported on 06/24/2015    Historical Provider, MD  Lifitegrast Shirley Friar) 5 %  SOLN Apply to eye.    Historical Provider, MD  Linaclotide Rolan Lipa) 290 MCG CAPS capsule Take 290 mcg by mouth daily.    Historical Provider, MD  magnesium hydroxide (MILK OF MAGNESIA) 400 MG/5ML suspension Take by mouth daily as needed for mild constipation.    Historical Provider, MD  medroxyPROGESTERone (PROVERA) 10 MG tablet Take 1 tablet (10 mg total) by mouth daily. 04/26/15   Demetrios Loll, MD  Menthol-Methyl Salicylate (ICY HOT EXTRA STRENGTH) 10-30 % CREA Apply topically.    Historical Provider, MD  multivitamin-iron-minerals-folic acid (THERAPEUTIC-M) TABS tablet Take 1 tablet by mouth daily.    Historical Provider, MD  nitrofurantoin, macrocrystal-monohydrate, (MACROBID) 100 MG capsule Reported on 06/24/2015 05/11/15   Historical Provider, MD  Omega-3 Krill Oil 1000 MG CAPS Take by mouth. Reported on 06/24/2015    Historical Provider, MD  ondansetron (ZOFRAN-ODT) 8 MG disintegrating tablet Take 8 mg  by mouth every 8 (eight) hours as needed for nausea or vomiting. Reported on 06/24/2015    Historical Provider, MD  pantoprazole (PROTONIX) 40 MG tablet Take 1 tablet (40 mg total) by mouth 2 (two) times daily before a meal. 04/26/15   Demetrios Loll, MD  PARoxetine (PAXIL) 30 MG tablet Take 30 mg by mouth daily.    Historical Provider, MD  promethazine (PHENERGAN) 25 MG tablet Take 1 tablet (25 mg total) by mouth every 6 (six) hours as needed for nausea or vomiting. 04/01/15   Jenise V Bacon Menshew, PA-C  ranitidine ([employer] RANITIDINE) 75 MG tablet Take by mouth.    Historical Provider, MD  Spirulina 500 MG TABS Take by mouth.    Historical Provider, MD  terbinafine (LAMISIL) 1 % cream Apply 1 application topically 2 (two) times daily.    Historical Provider, MD  terconazole (TERAZOL 7) 0.4 % vaginal cream Place 1 applicator vaginally at bedtime. Nightly x 7 nights. 05/25/15   Rubie Maid, MD  tiZANidine (ZANAFLEX) 4 MG tablet Take 4 mg by mouth every 6 (six) hours as needed for muscle spasms.    Historical  Provider, MD  triamcinolone cream (KENALOG) 0.1 % Apply 1 application topically 2 (two) times daily.    Historical Provider, MD  zolpidem (AMBIEN) 10 MG tablet Take 10 mg by mouth at bedtime as needed for sleep.    Historical Provider, MD    Allergies Bee venom; Darvon [propoxyphene]; Peanuts [peanut oil]; Penicillins; Percocet [oxycodone-acetaminophen]; Sulfa antibiotics; Sulfacetamide sodium; Adhesive [tape]; Amikacin; Aspirin; Doxycycline; Haloperidol; Hymenoptera venom preparations; Ibuprofen; Imipramine; Oxycodone-acetaminophen; Ultram [tramadol]; Epinephrine; Hydroxyzine; and Prednisone  Family History  Problem Relation Age of Onset  . Heart disease Father   . Cancer Mother     lung  . Urolithiasis Neg Hx   . Kidney disease Neg Hx   . Kidney cancer Neg Hx   . Prostate cancer Neg Hx     Social History Social History  Substance Use Topics  . Smoking status: Former Research scientist (life sciences)  . Smokeless tobacco: Never Used  . Alcohol use No    Review of Systems  Constitutional: No fever/chills Eyes: No visual changes. ENT: No sore throat. Cardiovascular: Denies chest pain. Respiratory: Positive for shortness of breath. Gastrointestinal: No abdominal pain.  No nausea, no vomiting.  No diarrhea.  No constipation. Genitourinary: Negative for dysuria. Musculoskeletal: Negative for back pain. Skin: Negative for rash. Neurological: Negative for headaches, focal weakness or numbness.  10-point ROS otherwise negative.  ____________________________________________   PHYSICAL EXAM:  VITAL SIGNS: ED Triage Vitals  Enc Vitals Group     BP 08/20/15 2328 (!) 156/98     Pulse Rate 08/20/15 2328 63     Resp 08/20/15 2328 (!) 22     Temp 08/20/15 2328 98.4 F (36.9 C)     Temp Source 08/20/15 2328 Oral     SpO2 08/20/15 2328 100 %     Weight 08/21/15 0411 124 lb (56.2 kg)     Height 08/21/15 0411 5\' 3"  (1.6 m)     Head Circumference --      Peak Flow --      Pain Score 08/21/15 0346 8      Pain Loc --      Pain Edu? --      Excl. in [place]? --     Constitutional: Asleep, easily awakened for exam. Alert and oriented. Well appearing and in no acute distress. Eyes: Conjunctivae are normal. PERRL. EOMI. Head: Atraumatic. Nose: No  congestion/rhinnorhea. Mouth/Throat: Mucous membranes are moist.  Oropharynx non-erythematous. Neck: No stridor.   Cardiovascular: Normal rate, regular rhythm. Grossly normal heart sounds.  Good peripheral circulation. Respiratory: Normal respiratory effort.  No retractions. Lungs CTAB. No wheezing.  Gastrointestinal: Soft and nontender. No distention. No abdominal bruits. No CVA tenderness. Musculoskeletal: No lower extremity tenderness nor edema.  No joint effusions. Neurologic:  Normal speech and language. No gross focal neurologic deficits are appreciated.  Skin:  Skin is warm, dry and intact. No rash noted. Psychiatric: Mood and affect are normal. Speech and behavior are normal.  ____________________________________________   LABS (all labs ordered are listed, but only abnormal results are displayed)  Labs Reviewed  BASIC METABOLIC PANEL - Abnormal; Notable for the following:       Result Value   Sodium 129 (*)    Chloride 97 (*)    CO2 21 (*)    All other components within normal limits  CBC - Abnormal; Notable for the following:    Platelets 117 (*)    All other components within normal limits  TROPONIN I   ____________________________________________  EKG  ED ECG REPORT I, Jamarri Vuncannon J, the attending physician, personally viewed and interpreted this ECG.   Date: 08/21/2015  EKG Time: 0354  Rate: 53  Rhythm: sinus bradycardia  Axis: normal  Intervals:none  ST&T Change: nonspecific  ____________________________________________  RADIOLOGY  Chest 2 view (view by me, interpreted per Dr. Marisue Humble): No acute pulmonary proces. ____________________________________________   PROCEDURES  Procedure(s) performed:  None  Procedures  Critical Care performed: No  ____________________________________________   INITIAL IMPRESSION / ASSESSMENT AND PLAN / ED COURSE  Pertinent labs & imaging results that were available during my care of the patient were reviewed by me and considered in my medical decision making (see chart for details).  65 year old female with shortness of breath after inhaling bug spray. She previously spoke with poison control who reassured her that she did not inhale a toxic dose. During the primary nurse assessment, patient became increasingly anxious. She complained of chest pain at that time, hence EKG and cardiac panel was performed. She was anxious about her blood pressure which was 165/84. She told the nurse that she would "stroke out" if her pressure became any higher. Patient does have a history of bipolar disorder as well as anxiety so she was given 1 mg IV Ativan to calm her. Ativan worked with good effect. Blood pressure is currently 112/80s. Patient awakens to exam but is a little groggy and goes immediately back to sleep without stimulation. Will observe patient in the emergency department until she is more awake and can call for a ride home.  Clinical Course  Comment By Time  Patient resting in NAD. Awaiting ride home. Paulette Blanch, MD 08/05 [phone]     ____________________________________________   FINAL CLINICAL IMPRESSION(S) / ED DIAGNOSES  Final diagnoses:  Chemical pneumonitis ([place])      NEW MEDICATIONS STARTED DURING THIS VISIT:  New Prescriptions   No medications on file     Note:  This document was prepared using Dragon voice recognition software and may include unintentional dictation errors.    Paulette Blanch, MD 08/21/15 [phone]

## 2015-08-21 NOTE — ED Notes (Signed)
Per pt, can call her friend [facility] for discharge, [phone]

## 2015-09-01 ENCOUNTER — Ambulatory Visit: Payer: Medicare Other | Admitting: Podiatry

## 2015-09-08 ENCOUNTER — Ambulatory Visit (INDEPENDENT_AMBULATORY_CARE_PROVIDER_SITE_OTHER): Payer: Medicare Other | Admitting: Obstetrics and Gynecology

## 2015-09-08 ENCOUNTER — Encounter: Payer: Self-pay | Admitting: Obstetrics and Gynecology

## 2015-09-08 VITALS — BP 135/78 | HR 63 | Ht 63.0 in | Wt 121.4 lb

## 2015-09-08 DIAGNOSIS — R102 Pelvic and perineal pain: Secondary | ICD-10-CM

## 2015-09-08 DIAGNOSIS — R238 Other skin changes: Secondary | ICD-10-CM | POA: Diagnosis not present

## 2015-09-08 DIAGNOSIS — N949 Unspecified condition associated with female genital organs and menstrual cycle: Secondary | ICD-10-CM

## 2015-09-08 DIAGNOSIS — D696 Thrombocytopenia, unspecified: Secondary | ICD-10-CM | POA: Diagnosis not present

## 2015-09-08 DIAGNOSIS — R634 Abnormal weight loss: Secondary | ICD-10-CM

## 2015-09-08 DIAGNOSIS — Z8742 Personal history of other diseases of the female genital tract: Secondary | ICD-10-CM

## 2015-09-08 DIAGNOSIS — L292 Pruritus vulvae: Secondary | ICD-10-CM | POA: Diagnosis not present

## 2015-09-08 DIAGNOSIS — R140 Abdominal distension (gaseous): Secondary | ICD-10-CM

## 2015-09-08 DIAGNOSIS — G8929 Other chronic pain: Secondary | ICD-10-CM

## 2015-09-08 DIAGNOSIS — N644 Mastodynia: Secondary | ICD-10-CM

## 2015-09-08 DIAGNOSIS — R233 Spontaneous ecchymoses: Secondary | ICD-10-CM

## 2015-09-08 DIAGNOSIS — Z113 Encounter for screening for infections with a predominantly sexual mode of transmission: Secondary | ICD-10-CM | POA: Diagnosis not present

## 2015-09-08 MED ORDER — HYDROCODONE-ACETAMINOPHEN 5-325 MG PO TABS: 1.0000 | ORAL_TABLET | Freq: Four times a day (QID) | ORAL | 0 refills | Status: DC | PRN

## 2015-09-08 NOTE — Progress Notes (Signed)
GYNECOLOGY PROGRESS NOTE  Subjective:    Patient ID: Angela [name], female    DOB: 1950/05/24, 65 y.o.   MRN: [id number]  HPI  Patient is a 65 y.o. [id number] female who presents for multiple complaints.  States that she is still noting external vaginal itching despite use of Premarin cream. Also noting abdominal bloating and weight loss over the past 3-4 weeks.  In addition, has begun noting easy bruising (mostly on upper extremities). Is still noting pelvic pain (intermittent, non-radiating) across the entire lower abdomen.  Feels like a "spasm".  Patient notes that she is also still grieving from the death of her boyfriend of 10 years from a heart attack, notes that her family is not being very supportive (especially her father).   Also, patient reports that she desires a breast exam today.  Notes that she has not had one in a while. She also mentions that she has not had any vaginal bleeding in "a while".   The following portions of the patient's history were reviewed and updated as appropriate: allergies, current medications, past family history, past medical history, past social history, past surgical history and problem list.  Review of Systems Pertinent items noted in HPI and remainder of comprehensive ROS otherwise negative.   Objective:   Blood pressure 135/78, pulse 63, height 5\' 3"  (1.6 m), weight 121 lb 6.4 oz (55.1 kg). General appearance: alert and no distress  Breasts: breasts appear normal, no suspicious masses, no skin or nipple changes or axillary nodes, moderate tenderness just below left nipple within areola. Abdomen: normal findings: no masses palpable and soft, non-tender and abnormal findings:  distended Pelvic: external genitalia normal, rectovaginal septum normal.  Vagina without discharge, atrophic.  Cervix normal appearing, no lesions and no motion tenderness, slightly flushed with vaginal wall, pinpoint os.  Uterus mobile, nontender, normal shape and size.   Adnexae non-palpable, nontender bilaterally.  Extremities: extremities normal, atraumatic, no cyanosis or edema Neurologic: Grossly normal  Skin: several areas of petechiae and brusing on legs and arms, recently appeared, no h/o easy bruising.    Assessment:   Persistent vulvar itching Weight loss Abdominal bloating Easy bruising Left breast tenderness H/o PMB Pelvic pain (intermittent, chronic)  Plan:   1) Persistent vulvar itching -  No lesions noted, only atrophy.  Patient using estrogen cream multiple times per week.  Notes that she has also been given steroid creams in the past (i.e. Clobetasol which have not really helped).  Discussed that patient may need a colposcopy and vulvar biopsy to r/o other causes of persistent vulvar itching. Will try triamcinolone cream for itching for now.  Will schedule for colposcopy.    2) Weight loss - review of patient's chart notes 3 lb weight loss (from 124 to 121) in past 3 weeks (seen in ER 3 weeks ago).  Prior to this, her weight was maintained at 121.  Discussed with patient that minimal weight loss has occurred at this point, but should continue to monitor for continued loss.  3) Abdominal bloating - patient's abdomen is distended today, notes that BMs are very regular.  No mass palpable.  Will get ultrasound to assess.  4) Easy bruising - Unsure cause, patient with no prior history.  Patient with noted mild thrombocytopenia (117K) on ER visit 3 weeks ago.  Was previously 289K back in May 2017. Discussed few causes that could lead to thrombocytopenia, including medications, infections, etc. Patient reports only change in medication was Depakote, which she also  believes is causing several of her other symptoms.  Will discuss with her Psychiatrist about changing medication.  Patient also desires HIV testing (after discussion of different infections that could cause abnormal skin lesions).  Will order.  5) Left breast tenderness - Patient has not had  a mammogram since 2014.  Will refer.  Advised on OTC pain meds prn, or ice packs to area.  6)  H/o PMB - patient with h/o PMB for ~ 2 years, has been worked up in the past with negative findings, but suggestion from prior [place] visit recommended a Hysteroscopy D&C.  Patient was scheduled several months ago, but cancelled surgery.  Notes that she currently is not having any bleeding and would like to hold for now on further evaluation.  Discussed importance of following through with recommendations.  Patient declines procedure for now.   7) Pelvic pain - patient notes that she was taking Hydrocodone for her intermittent pain, however has run out.  Requests short supply of prescription as she does not have any more and she does not have an appointment with her usual prescriber until the next week or so.  Will give 1 time short supply.    A total of 25 minutes were spent face-to-face with the patient during this encounter and over half of that time involved counseling and coordination of care.   Rubie Maid, MD [facility]

## 2015-09-12 ENCOUNTER — Encounter: Payer: Self-pay | Admitting: Obstetrics and Gynecology

## 2015-09-13 ENCOUNTER — Telehealth: Payer: Self-pay | Admitting: Obstetrics and Gynecology

## 2015-09-13 NOTE — Telephone Encounter (Signed)
DR Arville Care CALLED AND SAID YO AND HER TALKED ABOUT A SPECIAL MAMMOGRAM FOR HER. OKI SAID SHE NEEDED A REG MAMMOGRAM AT [place] SO WHEN I CALLED HER SHE SAID NO THAT'S NOT WHAT YALL TALKED ABOUT. SHE HAS ASKED THAT YO CALL HER.

## 2015-09-13 NOTE — Telephone Encounter (Signed)
The pt just needs a regular mammogram, can you please call her and give her the number you guys give pts to schedule at [facility]. Thanks

## 2015-09-13 NOTE — Telephone Encounter (Signed)
Sh e was talking about some different type of Mammogram, DSS takes her for her visits and don't like to take her out of town. She wantas to do this before her next visit with Dr. Marcelline Mates. It must be something her and Dr. Marcelline Mates talked about

## 2015-09-13 NOTE — Telephone Encounter (Signed)
The patient requested to have laser mammography (where they don't do the breast compression).  I looked into it during her visit and informed her that the closest imaging center that performed this was in [place].  Patient noted that she would have difficulty getting there due to her transportation issues.  I also informed her that it would not likely be covered by her insurance. She could however do 3D mammography (I think the cost of this would only be ~ $50 more than a regular mammogram) however this still requires some compression of the breast.  So the option is hers where she wants to go but just know that she may incur a cost and have to find alternative method of transportation if the laser mammogram is desired.    Rubie Maid, MD [facility]

## 2015-09-16 LAB — VON WILLEBRAND PANEL

## 2015-09-16 LAB — HIV ANTIBODY (ROUTINE TESTING W REFLEX): HIV SCREEN 4TH GENERATION: NONREACTIVE

## 2015-09-16 LAB — APTT

## 2015-09-16 LAB — PROTIME-INR

## 2015-09-17 ENCOUNTER — Telehealth: Payer: Self-pay

## 2015-09-17 NOTE — Telephone Encounter (Signed)
OG to contact pt with HIV results. Per OG she will need other labs re-drawn.

## 2015-09-17 NOTE — Telephone Encounter (Signed)
-----   Message from Brayton Mars, MD sent at 09/16/2015  7:36 AM EDT ----- Please Notify - Labs normal

## 2015-09-22 ENCOUNTER — Telehealth: Payer: Self-pay

## 2015-09-22 NOTE — Telephone Encounter (Signed)
Pt received from a food bank yesterday a box of dried goods and some outdated (expired in June) frozen hamburger meat. She woke up around 1:00 with severe nausea, diarrhea and abdominal pain. She contacted EMS and they gave her a bag of IV fluids and she stated that she started feeling better but still had diarrhea and some nausea. I advised her this could be possibly be a light case of food poison since the meat had been expired since June. Pt will continue taking her Zofran and drinking plenty of fluid. Advised her not to take Imodium as she currently has chronic constipation and this may make it worse. Pt will contact me if anything changes.

## 2015-09-27 ENCOUNTER — Ambulatory Visit: Payer: Medicare Other | Admitting: Podiatry

## 2015-09-29 ENCOUNTER — Encounter: Payer: Self-pay | Admitting: Podiatry

## 2015-09-29 ENCOUNTER — Ambulatory Visit (INDEPENDENT_AMBULATORY_CARE_PROVIDER_SITE_OTHER): Payer: Medicare Other | Admitting: Podiatry

## 2015-09-29 DIAGNOSIS — M79676 Pain in unspecified toe(s): Secondary | ICD-10-CM

## 2015-09-29 DIAGNOSIS — M722 Plantar fascial fibromatosis: Secondary | ICD-10-CM | POA: Diagnosis not present

## 2015-09-29 DIAGNOSIS — B351 Tinea unguium: Secondary | ICD-10-CM | POA: Diagnosis not present

## 2015-09-29 DIAGNOSIS — M79609 Pain in unspecified limb: Secondary | ICD-10-CM

## 2015-09-29 NOTE — Progress Notes (Signed)
She presents today for chief complaint of painful elongated toenails and painful heels bilaterally.  Objective: Pulses are palpable. She has pain on palpation indicated. Bilateral toenails are thick yellow dystrophic with mycotic and painful palpation.  Assessment Plantar fasciitis bilateral pain with seated onychomycosis.  Plan: Debridement of reactive hyperkeratosis toenails and injection of bilateral heels. Follow up with her as needed.

## 2015-10-05 ENCOUNTER — Other Ambulatory Visit: Payer: Self-pay | Admitting: Otolaryngology

## 2015-10-05 DIAGNOSIS — J324 Chronic pansinusitis: Secondary | ICD-10-CM

## 2015-10-07 ENCOUNTER — Other Ambulatory Visit: Payer: Self-pay | Admitting: Obstetrics and Gynecology

## 2015-10-07 ENCOUNTER — Ambulatory Visit (INDEPENDENT_AMBULATORY_CARE_PROVIDER_SITE_OTHER): Payer: Medicare Other

## 2015-10-07 ENCOUNTER — Encounter: Payer: Self-pay | Admitting: Obstetrics and Gynecology

## 2015-10-07 ENCOUNTER — Ambulatory Visit (INDEPENDENT_AMBULATORY_CARE_PROVIDER_SITE_OTHER): Payer: Medicare Other | Admitting: Obstetrics and Gynecology

## 2015-10-07 VITALS — BP 171/63 | HR 67 | Ht 63.0 in | Wt 120.5 lb

## 2015-10-07 DIAGNOSIS — L298 Other pruritus: Secondary | ICD-10-CM

## 2015-10-07 DIAGNOSIS — R350 Frequency of micturition: Secondary | ICD-10-CM | POA: Diagnosis not present

## 2015-10-07 DIAGNOSIS — I10 Essential (primary) hypertension: Secondary | ICD-10-CM | POA: Diagnosis not present

## 2015-10-07 DIAGNOSIS — R102 Pelvic and perineal pain: Secondary | ICD-10-CM | POA: Diagnosis not present

## 2015-10-07 DIAGNOSIS — R233 Spontaneous ecchymoses: Secondary | ICD-10-CM

## 2015-10-07 DIAGNOSIS — R140 Abdominal distension (gaseous): Secondary | ICD-10-CM | POA: Diagnosis not present

## 2015-10-07 DIAGNOSIS — Z8742 Personal history of other diseases of the female genital tract: Secondary | ICD-10-CM

## 2015-10-07 DIAGNOSIS — R300 Dysuria: Secondary | ICD-10-CM

## 2015-10-07 DIAGNOSIS — R238 Other skin changes: Secondary | ICD-10-CM

## 2015-10-07 DIAGNOSIS — N83209 Unspecified ovarian cyst, unspecified side: Secondary | ICD-10-CM

## 2015-10-07 DIAGNOSIS — N83291 Other ovarian cyst, right side: Secondary | ICD-10-CM

## 2015-10-07 DIAGNOSIS — N898 Other specified noninflammatory disorders of vagina: Secondary | ICD-10-CM

## 2015-10-07 DIAGNOSIS — N952 Postmenopausal atrophic vaginitis: Secondary | ICD-10-CM

## 2015-10-07 DIAGNOSIS — L292 Pruritus vulvae: Secondary | ICD-10-CM | POA: Diagnosis not present

## 2015-10-07 MED ORDER — ESTROGENS, CONJUGATED 0.625 MG/GM VA CREA: TOPICAL_CREAM | VAGINAL | 0 refills | Status: DC

## 2015-10-07 MED ORDER — ESTROGENS, CONJUGATED 0.625 MG/GM VA CREA: TOPICAL_CREAM | VAGINAL | 12 refills | Status: DC

## 2015-10-07 MED ORDER — ESTROGENS, CONJUGATED 0.625 MG/GM VA CREA: 1.0000 | TOPICAL_CREAM | Freq: Every day | VAGINAL | 12 refills | Status: DC

## 2015-10-07 MED ORDER — HYDROCODONE-ACETAMINOPHEN 5-325 MG PO TABS: 1.0000 | ORAL_TABLET | Freq: Four times a day (QID) | ORAL | 0 refills | Status: DC | PRN

## 2015-10-07 NOTE — Progress Notes (Signed)
GYNECOLOGY PROGRESS NOTE  Subjective:    Patient ID: Angela [name], female    DOB: [birth year], 65 y.o.   MRN: [id number]  HPI  Patient is a 65 y.o. G6P0010 female who presents for f/u of ultrasound results and labs for abdominal bloating, h/o PMB, and easy bruising.  Is also scheduled for colposcopy with possible biopsy for persistent vaginal vulvar itching. Has been out of Premarin cream x 1 month.  Patient still noting bloating.  Also reports urinary frequency for the past week, and pain in abdomen and urethra after voiding.   The following portions of the patient's history were reviewed and updated as appropriate: allergies, current medications, past family history, past medical history, past social history, past surgical history and problem list.  Review of Systems Pertinent items noted in HPI and remainder of comprehensive ROS otherwise negative.   Objective:   Blood pressure (!) 171/63, pulse 67, height 5\' 3"  (1.6 m), weight 120 lb 8 oz (54.7 kg). General appearance: alert and no distress Abdomen: soft, mildly distended.  No masses palpable. Mild tenderness in lower abdomen.  Pelvic: (see colposcopy note below).  Extremities: extremities normal, atraumatic, no cyanosis or edema.  Old small bruising noted on  Neurologic: Grossly normal    Imaging 10/07/2015 Pelvic Ultrasound): Indications:Pelvic Pain Findings:  The uterus measures 4.3 x 2.0 x 3.6 cm. Echo texture is homogenous without evidence of focal masses.  The Endometrium measures 2.3 mm.  Right Ovary measures 4.5 x 2 x 3.6 cm. Simple right ovarian cyst measures 3.2 x 1.7 x3.1cm. Left Ovary measures 2.4 x 1.5 x 2 cm. It is normal appearance. Survey of the adnexa demonstrates no adnexal masses. There is no free fluid in the cul de sac.  Impression: 1. Simple right ovarian cyst.  Recommendations: 1.Clinical correlation with the patient's History and Physical Exam.   Assessment:   Abdominal bloating  and pain H/o PMB Easy bruising Simple right ovarian cyst, small Chronic vulvar and vaginal itching  Vaginal atrophy Urinary frequency   Plan:   1. Abdominal bloating and pain - Discussed results of ultrasound with patient.  No findings to explain chronic lower abdominal pain.  Patient does have a h/o colitis. May need to f/u with GI. Also desires referral to pelvic floor physical therapy.  Will place referral.  Patient also notes that she was not able to get her pain prescription filled from last visit as she lost the paper prescription.  Will give new prescription.  2. H/o PMB - endometrial lining thin, stripe 2.3 mm.  No concerns for malignancy.  3. Easy bruising.  HIV testing negative, but remainder of labs were cancelled (unsure why). Patient notes that she does desire labs. Will have patient to return for blood draw. Understands that these may not be covered by insurance and would likely have to pay out of pocket.  4. Simple cyst, not likely cause of chronic abdominal pain due to size, will likely resolve without intervention.  5.  Urinary frequency - UA and culture ordered.  Patient voided 4 times during today's visit, with last 2 voids measured, one with 400 ml, the other with 100 ml (on catherization).  Concern that patient may have incomplete emptying/retention.  If ruled out for UA, patient may benefit from bladder scan and referral to Urology.  6. Colposcopy performed today of vagina for chronic itching (see colposcopy note below).  Also patient desired vaginal swab to assess for vaginal infection.  Nuswab collected. Will notify patient  of all results by phone.  7. Elevated BPs - patient with h/o HTN, reports taking meds as prescribed. Asymptoatic.  8.  Vaginal atrophy - can now resume Premarin cream.  Samples given and prescription given to patient.  CLINIC COLPOSCOPY PROCEDURE NOTE  65 y.o. G1P0010 here for colposcopy for chronic vulvar and vaginal itching.  Patient given informed  consent, Placed in lithotomy position. Cervix viewed with speculum and colposcope after application of acetic acid.   Colposcopy adequate? Yes  Findings: no visible lesions in vagina, no acetowhite areas noted; On vulva there was a small area of acetowhite noted on right labia minora near perineum.  Site was sprayed with hurricane spray, and injected with 1 ml of 1% lidocaine with epinephrine.  A 3 mm Keyes punch was used to obtain a  Biopsy.  Examination of the urethra and anus noted tenderness to palpation, with thinning of perirectal skin with mild erythema.   All specimens were labeled and sent to pathology.   Patient was given post procedure instructions.  Will follow up pathology and manage accordingly.  Routine preventative health maintenance measures emphasized.      A total of 30 minutes were spent face-to-face with the patient during this encounter and over half of that time involved counseling and coordination of care.    Rubie Maid, MD [facility]

## 2015-10-08 ENCOUNTER — Telehealth: Payer: Self-pay | Admitting: Obstetrics and Gynecology

## 2015-10-08 LAB — POCT URINALYSIS DIPSTICK
BILIRUBIN UA: NEGATIVE
GLUCOSE UA: NEGATIVE
Ketones, UA: NEGATIVE
Leukocytes, UA: NEGATIVE
Nitrite, UA: NEGATIVE
Protein, UA: NEGATIVE
RBC UA: NEGATIVE
SPEC GRAV UA: 1.015
Urobilinogen, UA: NEGATIVE
pH, UA: 6

## 2015-10-08 MED ORDER — ESTROGENS, CONJUGATED 0.625 MG/GM VA CREA: 1.0000 | TOPICAL_CREAM | Freq: Every day | VAGINAL | 12 refills | Status: DC

## 2015-10-10 LAB — URINE CULTURE: Organism ID, Bacteria: NO GROWTH

## 2015-10-10 NOTE — Addendum Note (Signed)
Addended by: Augusto Gamble on: 10/10/2015 10:53 PM   Modules accepted: Orders

## 2015-10-11 ENCOUNTER — Telehealth: Payer: Self-pay | Admitting: Obstetrics and Gynecology

## 2015-10-11 DIAGNOSIS — R339 Retention of urine, unspecified: Secondary | ICD-10-CM

## 2015-10-11 NOTE — Telephone Encounter (Signed)
Patient called complaining of abdominal bloating and wanted to know if you would know what could be causing it. Thanks

## 2015-10-12 LAB — NUSWAB VAGINITIS PLUS (VG+)
CANDIDA ALBICANS, NAA: NEGATIVE
CANDIDA GLABRATA, NAA: NEGATIVE
CHLAMYDIA TRACHOMATIS, NAA: NEGATIVE
Neisseria gonorrhoeae, NAA: NEGATIVE
TRICH VAG BY NAA: NEGATIVE

## 2015-10-13 ENCOUNTER — Ambulatory Visit
Admission: RE | Admit: 2015-10-13 | Discharge: 2015-10-13 | Disposition: A | Discharge status: Home / Self Care | Payer: Medicare Other | Source: Ambulatory Visit | Attending: Otolaryngology | Admitting: Otolaryngology

## 2015-10-13 DIAGNOSIS — J342 Deviated nasal septum: Secondary | ICD-10-CM | POA: Insufficient documentation

## 2015-10-13 DIAGNOSIS — J324 Chronic pansinusitis: Secondary | ICD-10-CM | POA: Insufficient documentation

## 2015-10-13 LAB — PATHOLOGY

## 2015-10-13 NOTE — Telephone Encounter (Signed)
PT CALLED AND SHE IS WANTING A CALL BACK TODAY, SHE CALLED LAST WEEK AND HAS NOT HEARD ANYTHING BACK, SHE IS STATING SHE IS HAVING EXTREME ITCHING IN HER ANUS AREA AND SORENESS IN HER LABIA, AND ALSO BLOATING.

## 2015-10-14 ENCOUNTER — Telehealth: Payer: Self-pay | Admitting: Gastroenterology

## 2015-10-14 NOTE — Telephone Encounter (Signed)
Called pt informed her that I placed urgent referral for urology, also placed call to Dr.Wohl's office to help expedite appointment. Informed pt that she can come by the office for blood draw (orders already placed) at her convenience.

## 2015-10-14 NOTE — Telephone Encounter (Signed)
Patient has called and requested an urgent appointment with Dr Allen Norris. She states that she is having abdominal bloating and pain. Patient would like to talk with the nurse to discuss her options on treatment for this.

## 2015-10-14 NOTE — Telephone Encounter (Signed)
Patient would like an urgent appointment next week with Dr Allen Norris. Please call and advise. She is in a lot of pain, swollen/bloated abdomen, losing weight. Please call today.

## 2015-10-15 ENCOUNTER — Ambulatory Visit
Admission: RE | Admit: 2015-10-15 | Discharge: 2015-10-15 | Disposition: A | Discharge status: Home / Self Care | Payer: Medicare Other | Source: Ambulatory Visit | Attending: Gastroenterology | Admitting: Gastroenterology

## 2015-10-15 ENCOUNTER — Other Ambulatory Visit: Payer: Self-pay

## 2015-10-15 DIAGNOSIS — R938 Abnormal findings on diagnostic imaging of other specified body structures: Secondary | ICD-10-CM | POA: Insufficient documentation

## 2015-10-15 DIAGNOSIS — R1084 Generalized abdominal pain: Secondary | ICD-10-CM | POA: Insufficient documentation

## 2015-10-15 DIAGNOSIS — R140 Abdominal distension (gaseous): Secondary | ICD-10-CM | POA: Diagnosis not present

## 2015-10-15 LAB — POCT I-STAT CREATININE: Creatinine, Ser: 0.6 mg/dL (ref 0.44–1.00)

## 2015-10-15 MED ORDER — IOPAMIDOL (ISOVUE-300) INJECTION 61%
75.0000 mL | Freq: Once | INTRAVENOUS | Status: AC | PRN
  Administered 2015-10-15: 75 mL via INTRAVENOUS

## 2015-10-15 NOTE — Telephone Encounter (Signed)
Pt has been scheduled for a STAT CT scan of abdomen/pelvis at [facility] outpatient imaging, Angela [name] today. Pt has been notified of this information. She was working on getting a ride. Medical transportation was not able to bring her today but she was going to check if a church member could drop her off.   Rx for Bentyl was sent to pt's pharmacy. Per pharmacist, Bentyl only came as 10mg  capsules. Dr. Allen Norris requested due to pt's allergy, she is to have 5mg . Bentyl only comes in that mg in liquid.

## 2015-10-15 NOTE — Telephone Encounter (Signed)
Spoke with pt regarding her symptoms. Pt stated she has lost over 7 lbs in 1 month. She is bloated and miserable. She isn't eating a lot because she isn't feeling hungry. She is going on a trip and cannot go feeling like this. I tried offering her an appt the 2nd week of October as Dr. Allen Norris will not be available next week. She says she cannot wait until then. She wanted to speak with Dr. Allen Norris. She didn't want to continue speaking with me because she says I'm not a doctor. Please contact pt today if possible.

## 2015-10-18 LAB — HM COLONOSCOPY

## 2015-10-20 ENCOUNTER — Ambulatory Visit: Payer: Self-pay | Admitting: Obstetrics and Gynecology

## 2015-10-26 ENCOUNTER — Telehealth: Payer: Self-pay | Admitting: Obstetrics and Gynecology

## 2015-10-26 NOTE — Telephone Encounter (Signed)
Pt has an emergency. She needs some advice on something that has happened asap before she goes out of town. Please call her.

## 2015-10-26 NOTE — Telephone Encounter (Signed)
Contacted patient, left message on voicemail to return call as there was no answer.

## 2015-11-01 ENCOUNTER — Ambulatory Visit: Payer: Self-pay | Admitting: Urology

## 2015-11-09 ENCOUNTER — Telehealth: Payer: Self-pay | Admitting: Obstetrics and Gynecology

## 2015-11-09 DIAGNOSIS — N939 Abnormal uterine and vaginal bleeding, unspecified: Secondary | ICD-10-CM

## 2015-11-09 NOTE — Telephone Encounter (Signed)
PT CALLED AND SHE IS VAGINAL BLEEDING HEAVY AND HAVING CRAMPING AND IT STARTED IN [place]. PT STATED SHE WOULD LIKE A CALL BACK TODAY AND NOT AT 5:00 WHEN SHE CAN NOT DO ANYTHING

## 2015-11-09 NOTE — Telephone Encounter (Signed)
Called pt she states that she is bleeding and cramping and has been x 1 week. Pt DECLINES to be seen at [facility] or ED. Pt states that she wants a referral to see an GYN specialist at [facility] or [facility] as she believes that Dr.Cherry cannot help her with this issue as she has been seen multiple times for this issue. Will place referral.

## 2015-11-11 ENCOUNTER — Encounter: Payer: Self-pay | Admitting: Physical Therapy

## 2015-11-11 ENCOUNTER — Ambulatory Visit: Payer: Medicare Other | Attending: Orthopedic Surgery | Admitting: Physical Therapy

## 2015-11-11 DIAGNOSIS — G8929 Other chronic pain: Secondary | ICD-10-CM | POA: Diagnosis present

## 2015-11-11 DIAGNOSIS — M545 Low back pain: Secondary | ICD-10-CM | POA: Insufficient documentation

## 2015-11-11 DIAGNOSIS — M26629 Arthralgia of temporomandibular joint, unspecified side: Secondary | ICD-10-CM | POA: Diagnosis present

## 2015-11-11 DIAGNOSIS — M542 Cervicalgia: Secondary | ICD-10-CM

## 2015-11-11 DIAGNOSIS — M25512 Pain in left shoulder: Secondary | ICD-10-CM | POA: Insufficient documentation

## 2015-11-11 NOTE — Therapy (Signed)
[place] [facility] [address] [place], [address] Phone: [phone]   Fax:  [phone]  Physical Therapy Evaluation  Patient Details  Name: Angela [name] MRN: [id number] Date of Birth: Jul 19, 1950 Referring Provider: Osvaldo Human Myrene Buddy, MD  Encounter Date: 11/11/2015      PT End of Session - 11/11/15 1503    Visit Number 1   Number of Visits 9   Authorization Type 1/10   PT Start Time 1350   PT Stop Time 1453 67   PT Time Calculation (min) 63 min   Activity Tolerance Patient tolerated treatment well   Behavior During Therapy [facility] for tasks assessed/performed      Past Medical History:  Diagnosis Date  . Allergic rhinitis   . Anemia   . Bipolar 1 disorder ([place])   . Blurred vision   . Bronchitis   . Chronic constipation   . Chronic sinusitis   . Depression   . Diverticulitis   . Diverticulosis   . Dizziness   . Endometriosis   . Endometriosis   . Facial pain   . GERD (gastroesophageal reflux disease)   . GERD (gastroesophageal reflux disease)   . Hernia, hiatal   . Hypertension   . Hypothyroidism   . Malignant neoplasm of skin   . Migraine   . Neuropathy ([place])   . Otalgia   . Otitis media   . skin cancer   . Thyroid disease     Past Surgical History:  Procedure Laterality Date  . APPENDECTOMY    . CHOLECYSTECTOMY    . CHOLECYSTECTOMY, LAPAROSCOPIC    . ESOPHAGOGASTRODUODENOSCOPY (EGD) WITH PROPOFOL N/A 03/29/2015   Procedure: ESOPHAGOGASTRODUODENOSCOPY (EGD) WITH PROPOFOL;  Surgeon: Josefine Class, MD;  Location: [facility] ENDOSCOPY;  Service: Endoscopy;  Laterality: N/A;  . HERNIA REPAIR    . laproscopy    . TONSILLECTOMY    . uteral suspension      There were no vitals filed for this visit.       Subjective Assessment - 11/11/15 1452    Subjective Neck pain, low back pain, L shoulder pain, TMJ pain   Pertinent History Pt reports that she has been having neck pain  for 65 years. She reports having stenosis, DDD, TMJ, and has back pain. She denies trauma and states that it gradually got worse and she started waking up with HA and pain in her neck. She reports having PT prior at [facility] for her pain; she reports that it helped for a while but her symptoms have begun to worsen over the last few months. She states that she had colitis back in April and was hospitalized, and ever since her neck and back have been bothering her. She report intermittent radicular symptoms and n/t down LUE. She reports that her BUE feel weaker since the pain has started 4 years ago. Her sleep is affected due to the pain. She reports this pain affects her household ADLs, stating "there's somethings I can't do anymore because of my pain." She denies having this same pain before. 65 She reports that it is worse in the morning ("I wake up stiff all over"). She has an aide that comes out to the house 5 days/week 65 for 2 hours who helps cook meals and help her shower ("I have holes in my ears so I can never wash my own hair again"). She reports moving her neck aggravates her pain; biofreeze and heating pad or ice help her  pain. She is right handed. She states that her LBP is worse than her neck pain. She states that it is a constant pain and she has intermittent n/t in her feet. She likes to travel, cook, shop, photography for fun. She is going to a sleep doctor tomorrow for an evaluation.   Limitations House hold activities;Other (comment)  sleeping   Currently in Pain? Yes   Pain Score 8    Pain Location Back   Pain Orientation Lower   Pain Descriptors / Indicators Constant   Pain Onset More than a month ago   Pain Frequency Constant            [facility] PT Assessment - 11/11/15 0001      Assessment   Medical Diagnosis myofascial pain syndrome; bilateral temporomandibular joint pain   Referring Provider Osvaldo Human Myrene Buddy, MD   Onset Date/Surgical Date 11/11/11   Hand Dominance  Right   Prior Therapy yes, for current issue     Balance Screen   Has the patient fallen in the past 6 months Yes   Has the patient had a decrease in activity level because of a fear of falling?  Yes   Is the patient reluctant to leave their home because of a fear of falling?  Yes     [place] home   Living Arrangements Non-relatives/Friends  lives with 2 males   Type of Home Group Home   Home Access Stairs to enter   Entrance Stairs-Number of Steps 2   Entrance Stairs-Rails Right   Home Layout One level   [place] - 4 wheels;Tub bench     Prior Function   Level of Independence Independent   Vocation On disability   Leisure travel, reading, photography, cook, shop     Cognition   Overall Cognitive Status History of cognitive impairments - at baseline     Did not perform objective testing this date as pt noticeably distressed throughout history and subjective portion of evaluation. Pt verbalized multiple times that the pain is affecting her sleeping and she has not slept in a long time. PT found it most beneficial to assess pt's sleeping position and provide extensive education on positioning to try and help pt sleep better. Pt prefers to sleep on her side (she already sleeps with towel roll in pillow case); SPT and PT educated pt to put a pillow between her knees when on her side, as this puts the hips and lumbar spine in more neutral position. Pt verbalized and demonstrated understanding. Attempted to have pt place towel roll under each arm to help keep shoulder from rolling forward, but pt reported it as too painful on her ribs. Attempted this again with a pillow but pt stated "it was too bulky and she wouldn't be able to breathe." Pt finally found comfort with putting pillow behind her and resting the arm on her side.  Pt asked for shoulder strengthening exercises because she "didn't want to have rotator cuff surgery" and she wanted to  help her muscles relax. PT and SPT attempted to demonstrate bil rowing with YTB to promote more upright posture, and strengthen shoulders, but pt stated she did not see benefit of performing exercising for relaxation and stated that she preferred "hands on, myofascial release because that's what my doctor told me I was going to be getting in therapy." Due to time, pt unable to perform myofascial release this date but pt wished for biofreeze  to be applied to lower back. PT applied biofreeze to pt's lower back and she reported decreased pain following.      PT Education - 11/11/15 1502    Education provided Yes   Education Details positioning for improved comfort during sleeping   Person(s) Educated Patient   Methods Explanation   Comprehension Verbalized understanding             PT Long Term Goals - 11/11/15 1516      PT LONG TERM GOAL #1   Title Pt will be independent with HEP to maximize improvement and decrease risk of reinjury.   Time 4   Period Weeks   Status New     PT LONG TERM GOAL #2   Title Pt will be independent with walking program to maximize overall function and decrease risk of reinjury.   Time 4   Period Weeks   Status New     PT LONG TERM GOAL #3   Title Pt will be able to sleep for 8 hours and be woken up <5 times due to pain to demonstrate improved overall function and to improve overall health.    Time 4   Period Weeks   Status New               Plan - 11/11/15 1504    Clinical Impression Statement Pt is 65 YO F who presents to therapy with c/o neck, back, shoulder, and TMJ pain. Pt poor historian throughout session and had scattered thoughts throughout history taking. Her main complaint was regarding her sleep, as she reported having extreme difficulty falling and staying asleep due to her pain. PT and SPT assessed pt's sleeping position and made adjustments as needed to help minimize pain. Pt prefers to sleep on her side and reported relief in LBP  with a pillow between her knees. Pt verbalized she thought she was going to be receiving myofascial release during sessions; PT educated pt that since treatment sessions are limited ot 45 mins to an hour, changing her sleeping position during this session would likely ellicit a quicker effect on pain compared to myofascial release. PT educated pt that her other symptoms complaints will be addressed in future sessions as needed. Pt verbalized that she feels she would benefit more from "more hands on, myofascial release" and stated that she would feel more comfortable with a female therapist performing the myofascial release.    Rehab Potential Poor   Clinical Impairments Affecting Rehab Potential co-morbidities; history of bipolar disorder per medical chart; assistance available at discharge   PT Frequency 2x / week   PT Duration 4 weeks   PT Treatment/Interventions ADLs/Self Care Home Management;Cryotherapy;Electrical Stimulation;Iontophoresis 4mg /ml Dexamethasone;Moist Heat;Gait training;Stair training;Functional mobility training;Therapeutic activities;Therapeutic exercise;Aquatic Therapy;Balance training;Neuromuscular re-education;Patient/family education;Manual techniques;Passive range of motion   PT Next Visit Plan potentially soft tissue mobilization; postural strengthening   Consulted and Agree with Plan of Care Patient      Patient will benefit from skilled therapeutic intervention in order to improve the following deficits and impairments:  Decreased activity tolerance, Decreased endurance, Decreased strength, Impaired perceived functional ability, Impaired UE functional use, Pain  Visit Diagnosis: Cervicalgia  Chronic low back pain, unspecified back pain laterality, with sciatica presence unspecified  Arthralgia of temporomandibular joint, unspecified laterality  Chronic left shoulder pain     Problem List Patient Active Problem List   Diagnosis Date Noted  . Absolute anemia  06/24/2015  . Clinical depression 06/24/2015  . Endometriosis 06/24/2015  . BP (  high blood pressure) 06/24/2015  . Headache, migraine 06/24/2015  . Disease of thyroid gland 06/24/2015  . PMB (postmenopausal bleeding) 05/22/2015  . Bipolar 1 disorder ([place]) 04/23/2015  . Anxiety state 04/23/2015  . Colitis, acute 04/20/2015  . Melena 04/20/2015  . Accelerated hypertension 04/20/2015  . TIA (transient ischemic attack) 04/20/2015  . Hypothyroidism 04/20/2015  . GERD (gastroesophageal reflux disease) 04/20/2015  . Chest pain 04/20/2015  . Acute colitis 04/20/2015  . Cervical disc disorder with radiculopathy 03/25/2015  . Cervical pain 03/25/2015  . Pelvic pain in female 10/30/2014  . Bipolar affective disorder ([place]) 06/30/2014  . CA of skin 06/30/2014  . Cephalalgia 12/16/2013  . Neuropathy ([place]) 12/16/2013   Geraldine Solar, SPT  Geraldine Solar 11/11/2015, 4:00 PM  [place] [facility] [address] [place], [address] Phone: [phone]   Fax:  [phone]  Name: MURIELLE MARSICANO MRN: [id number] Date of Birth: 1950-05-11

## 2015-11-16 ENCOUNTER — Ambulatory Visit: Payer: Medicare Other | Admitting: Physical Therapy

## 2015-11-17 ENCOUNTER — Telehealth: Payer: Self-pay | Admitting: Obstetrics and Gynecology

## 2015-11-17 NOTE — Telephone Encounter (Signed)
PT CALLED AND SHE WOULD LIKE A CALL BACK IT IS URGENT SHE SAID SHE HAS BEEN WAITING ON YOU FOR SOMETHING FOR A WEEK, SHE DOES NOT WANT A CALL BACK AT 5 TODAY SHE SAID IF SHE HASN'T HEARD FROM YOU IN 1 HOUR SHE IS CALLING BACK.

## 2015-11-18 ENCOUNTER — Ambulatory Visit: Payer: Medicare Other | Admitting: Physical Therapy

## 2015-11-18 NOTE — Telephone Encounter (Signed)
Patient called back asking why she hasnt gotten a call back. She asks if shes been dismissed or something. She states she has been trying to get her issues handled for a month. She stated she would like a call back today.

## 2015-11-19 ENCOUNTER — Telehealth: Payer: Self-pay | Admitting: *Deleted

## 2015-11-19 NOTE — Telephone Encounter (Signed)
Called pt informed her that referral was placed and that the office of Dr.Gerig would be calling her to set up an appointment in the coming week.

## 2015-11-19 NOTE — Telephone Encounter (Signed)
Spoke with the cancer center at [facility] long. They will be contacting the patient to schedule an appointment.

## 2015-11-19 NOTE — Telephone Encounter (Signed)
Pt called requesting appointments with Dr. Alycia Rossetti or Dr. Skeet Latch. Pt was advised that  Dr. Alycia Rossetti and Dr. Skeet Latch had no availability at this location until later dates.  Pt was given available dates and alternate providers at this location. Pt was very persistent to see Dr. Alycia Rossetti or Skeet Latch.  Pt was also offered an appointment at this location today.. Pt declined stating she had no transportation and she did not want to see a female doctor. A call was placed to Dr. Andreas Blower office . I spoke  with RN  and  notified her of the patient's request.  The number to [facility] was given so that the patient may be accommodated.  Pt was then called back and notified of the conversation with the referring office concerning obtaining a appointment at [facility] with Dr. Alycia Rossetti or Dr. Skeet Latch.

## 2015-11-19 NOTE — Telephone Encounter (Signed)
Called pt she states that she has still not heard anything back from her referral that was placed on 11/09/2015, Advised pt that I would have referral coordinator check on this and I would call her back with up date. Martin Majestic can you please call and see if there is anything else we need to do.

## 2015-11-22 ENCOUNTER — Ambulatory Visit: Payer: Medicare Other | Admitting: Gastroenterology

## 2015-11-23 ENCOUNTER — Encounter: Payer: Medicare Other | Admitting: Physical Therapy

## 2015-11-23 ENCOUNTER — Ambulatory Visit: Payer: Medicare Other | Admitting: Physical Therapy

## 2015-11-24 ENCOUNTER — Ambulatory Visit: Payer: Medicare Other | Admitting: Podiatry

## 2015-11-25 ENCOUNTER — Encounter: Payer: Medicare Other | Admitting: Physical Therapy

## 2015-11-29 ENCOUNTER — Encounter: Payer: Self-pay | Admitting: Physical Therapy

## 2015-11-29 ENCOUNTER — Ambulatory Visit: Payer: Medicare Other | Attending: Orthopedic Surgery | Admitting: Physical Therapy

## 2015-11-29 DIAGNOSIS — M542 Cervicalgia: Secondary | ICD-10-CM | POA: Insufficient documentation

## 2015-11-29 DIAGNOSIS — G8929 Other chronic pain: Secondary | ICD-10-CM | POA: Diagnosis present

## 2015-11-29 DIAGNOSIS — M25512 Pain in left shoulder: Secondary | ICD-10-CM | POA: Diagnosis present

## 2015-11-29 DIAGNOSIS — M545 Low back pain: Secondary | ICD-10-CM | POA: Insufficient documentation

## 2015-11-30 ENCOUNTER — Telehealth: Payer: Self-pay | Admitting: Obstetrics and Gynecology

## 2015-11-30 NOTE — Therapy (Signed)
[facility] [address], [place], [address] Phone: [phone]   Fax:  [phone]  Physical Therapy Treatment  Patient Details  Name: Angela [name] MRN: [id number] Date of Birth: 12-20-1950 Referring Provider: Osvaldo Human Myrene Buddy, MD  Encounter Date: 11/29/2015      PT End of Session - 11/29/15 1630    Visit Number 2   Number of Visits 9   Date for PT Re-Evaluation 12/17/15   Authorization Type 2   Authorization Time Period 10 (G code)   PT Start Time 1532   PT Stop Time 1625   PT Time Calculation (min) 53 min   Activity Tolerance Patient tolerated treatment well   Behavior During Therapy [facility] for tasks assessed/performed      Past Medical History:  Diagnosis Date  . Allergic rhinitis   . Anemia   . Bipolar 1 disorder ([place])   . Blurred vision   . Bronchitis   . Chronic constipation   . Chronic sinusitis   . Depression   . Diverticulitis   . Diverticulosis   . Dizziness   . Endometriosis   . Endometriosis   . Facial pain   . GERD (gastroesophageal reflux disease)   . GERD (gastroesophageal reflux disease)   . Hernia, hiatal   . Hypertension   . Hypothyroidism   . Malignant neoplasm of skin   . Migraine   . Neuropathy ([place])   . Otalgia   . Otitis media   . skin cancer   . Thyroid disease     Past Surgical History:  Procedure Laterality Date  . APPENDECTOMY    . CHOLECYSTECTOMY    . CHOLECYSTECTOMY, LAPAROSCOPIC    . ESOPHAGOGASTRODUODENOSCOPY (EGD) WITH PROPOFOL N/A 03/29/2015   Procedure: ESOPHAGOGASTRODUODENOSCOPY (EGD) WITH PROPOFOL;  Surgeon: Josefine Class, MD;  Location: [facility] ENDOSCOPY;  Service: Endoscopy;  Laterality: N/A;  . HERNIA REPAIR    . laproscopy    . TONSILLECTOMY    . uteral suspension      There were no vitals filed for this visit.      Subjective Assessment - 11/29/15 1542    Subjective Patient reports she is having pain in neck and  around jaw today. She does clench and may grind her teeth. Her sleep is disturbed due to not sleeping on a good mattress and getting flea bites.    Limitations House hold activities;Other (comment)   Patient Stated Goals less pain and relaxation and more mobility, release from muscle tension   Currently in Pain? Yes   Pain Score 8    Pain Location Neck  jaw   Pain Orientation Left   Pain Descriptors / Indicators Aching;Stabbing   Pain Type Chronic pain   Pain Onset More than a month ago   Pain Frequency Constant      Objective; Posture; mild forward head posture, forward rounded shoulders AROM: mouth opening WNL with palpable popping in both TMJs Palpation: tender, spasms bilateral masseter muscles, temporalis muscles, cervical spine and suboccipital muscles  Treatment: Manual therapy: STM/MFR performed to facial muscles, masseter, TMJ region, suboccipital release, cervical spine muscles with patient supine lying: goals: pain, reduce spasms  Therapeutic exercises: patient performed with verbal, tactile cues and demonstration of therapist Supine lying:  cervical spine retraction x 10  Scapular retraction supine and sitting x 10 Controlled opening of mouth with tongue at roof of mouth x 6 reps Educated in positioning for sleep on back with cervical  roll and side lying with pillows between knees, roll and pillow under head  Patient response to treatment: patient demonstrated improved technique with exercises with moderate VC for correct alignment. Patient with decreased pain from 8/10 to 4/10. Patient with decreased spasms by 50% following STM. Improved motor control with repetition and cuing        PT Education - 11/29/15 1628    Education provided Yes   Education Details HEP: postural exercises, positioning with cervical roll and side lying for sleeping    Person(s) Educated Patient   Methods Explanation;Demonstration;Verbal cues;Handout;Tactile cues   Comprehension Verbalized  understanding;Verbal cues required;Returned demonstration;Tactile cues required             PT Long Term Goals - 11/11/15 1516      PT LONG TERM GOAL #1   Title Pt will be independent with HEP to maximize improvement and decrease risk of reinjury.   Time 4   Period Weeks   Status New     PT LONG TERM GOAL #2   Title Pt will be independent with walking program to maximize overall function and decrease risk of reinjury.   Time 4   Period Weeks   Status New     PT LONG TERM GOAL #3   Title Pt will be able to sleep for 8 hours and be woken up <5 times due to pain to demonstrate improved overall function and to improve overall health.    Time 4   Period Weeks   Status New               Plan - 11/29/15 1620    Clinical Impression Statement Patient tolerated treatment well with decreased pain in neck and jaw by at least 50% with STM. She continues with spasms, pain in jaw and back and will benefit from additional physical therapy  Intervention to address limitations and achieve goals.    Rehab Potential Poor   PT Frequency 2x / week   PT Duration 4 weeks   PT Treatment/Interventions ADLs/Self Care Home Management;Cryotherapy;Electrical Stimulation;Iontophoresis 4mg /ml Dexamethasone;Moist Heat;Gait training;Stair training;Functional mobility training;Therapeutic activities;Therapeutic exercise;Aquatic Therapy;Balance training;Neuromuscular re-education;Patient/family education;Manual techniques;Passive range of motion   PT Next Visit Plan manual techniques, STM, therapeutic exercises   PT Home Exercise Plan scapular retraction, cervical retraction, use of cervical roll, lumbar support      Patient will benefit from skilled therapeutic intervention in order to improve the following deficits and impairments:  Decreased activity tolerance, Decreased endurance, Decreased strength, Impaired perceived functional ability, Impaired UE functional use, Pain  Visit  Diagnosis: Cervicalgia  Chronic low back pain, unspecified back pain laterality, with sciatica presence unspecified     Problem List Patient Active Problem List   Diagnosis Date Noted  . Absolute anemia 06/24/2015  . Clinical depression 06/24/2015  . Endometriosis 06/24/2015  . BP (high blood pressure) 06/24/2015  . Headache, migraine 06/24/2015  . Disease of thyroid gland 06/24/2015  . PMB (postmenopausal bleeding) 05/22/2015  . Bipolar 1 disorder ([place]) 04/23/2015  . Anxiety state 04/23/2015  . Colitis, acute 04/20/2015  . Melena 04/20/2015  . Accelerated hypertension 04/20/2015  . TIA (transient ischemic attack) 04/20/2015  . Hypothyroidism 04/20/2015  . GERD (gastroesophageal reflux disease) 04/20/2015  . Chest pain 04/20/2015  . Acute colitis 04/20/2015  . Cervical disc disorder with radiculopathy 03/25/2015  . Cervical pain 03/25/2015  . Pelvic pain in female 10/30/2014  . Bipolar affective disorder ([place]) 06/30/2014  . CA of skin 06/30/2014  . Cephalalgia 12/16/2013  .  Neuropathy ([place]) 12/16/2013    Jomarie Longs PT 11/30/2015, 9:29 PM  [facility] [address], [place], [address] Phone: [phone]   Fax:  [phone]  Name: Angela [name] MRN: [id number] Date of Birth: [birth year]

## 2015-11-30 NOTE — Telephone Encounter (Signed)
Pt called and has had no word about any referral that she needs she stated she would like a call back from you.

## 2015-11-30 NOTE — Telephone Encounter (Signed)
Called pt informed her that the referral was placed and that they have tried to contact the pt several times. Pt states that she will call their office back tomorrow.

## 2015-11-30 NOTE — Telephone Encounter (Signed)
I spoke with Angela [name] at [facility]. They have been trying to reach the patient and left messages. Dr Alycia Rossetti doesn't recommend anything any different than what Dr Marcelline Mates is doing already.

## 2015-11-30 NOTE — Telephone Encounter (Signed)
Angela [name], have you heard any more information on this referral

## 2015-12-01 ENCOUNTER — Ambulatory Visit (INDEPENDENT_AMBULATORY_CARE_PROVIDER_SITE_OTHER): Payer: Medicare Other | Admitting: Gastroenterology

## 2015-12-01 ENCOUNTER — Encounter: Payer: Self-pay | Admitting: Gastroenterology

## 2015-12-01 VITALS — BP 130/74 | HR 72 | Temp 98.0°F | Ht 63.0 in | Wt 122.5 lb

## 2015-12-01 DIAGNOSIS — M791 Myalgia: Secondary | ICD-10-CM | POA: Diagnosis not present

## 2015-12-01 DIAGNOSIS — M7918 Myalgia, other site: Secondary | ICD-10-CM

## 2015-12-01 NOTE — Progress Notes (Signed)
Primary Care Physician: Maryland Pink, MD  Primary Gastroenterologist:  Dr. Lucilla Lame  Chief Complaint  Patient presents with  . Abdominal Pain  . Rectal Bleeding    HPI: Angela [name] is a 65 y.o. female here for right lower quadrant pain and some rectal bleeding. The patient was found to have an anal fissure that her OB/GYN appointment. The patient also reports that she has had vaginal bleeding. The patient's IBS C is well-controlled with Linzess and MiraLAX. The patient reports that her right sided abdominal pain has been present for the last 4-6 months. It is not associated with any eating drinking or moving her bowels. He also has been under a lot of stress recently with her long time boyfriend dying and having multiple family members ill or dying.  Current Outpatient Prescriptions  Medication Sig Dispense Refill  . ammonium lactate (AMLACTIN) 12 % cream     . Biotin 5000 MCG CAPS Take 1 capsule by mouth daily.     . calcium carbonate (OS-CAL) 600 MG TABS tablet Take 600 mg by mouth 3 (three) times daily with meals.    . cetirizine (ZYRTEC) 10 MG tablet Take 10 mg by mouth daily.    . Cholecalciferol (VITAMIN D-1000 MAX ST) 1000 units tablet Take by mouth.    . conjugated estrogens (PREMARIN) vaginal cream Apply a pea sized amount to the labia and vagina daily as needed 42.5 g 0  . conjugated estrogens (PREMARIN) vaginal cream Place 1 Applicatorful vaginally daily. 42.5 g 12  . dicyclomine (BENTYL) 10 MG capsule Take 1 capsule (10 mg total) by mouth 3 (three) times daily as needed for spasms. 30 capsule 2  . divalproex (DEPAKOTE) 500 MG DR tablet Take 500 mg by mouth 3 (three) times daily.    Marland Kitchen erythromycin ophthalmic ointment 1 application at bedtime.    . fluticasone (FLONASE) 50 MCG/ACT nasal spray     . HYDROcodone-acetaminophen (NORCO/VICODIN) 5-325 MG tablet Take 1-2 tablets by mouth every 6 (six) hours as needed for moderate pain. Reported on 06/24/2015 40 tablet 0  .  hydrocortisone (ANUSOL-HC) 2.5 % rectal cream Place 1 application rectally 2 (two) times daily.    . Hypromellose (GENTEAL MILD) 0.2 % SOLN Apply to eye.    Marland Kitchen ipratropium (ATROVENT) 0.03 % nasal spray Place 2 sprays into both nostrils every 12 (twelve) hours.    . IRON PO Take by mouth every other day. Reported on 06/24/2015    . Iron-FA-B Cmp-C-Biot-Probiotic (FUSION PLUS) CAPS Take 130 mg by mouth daily. 30 capsule 6  . LACTOBACILLUS EXTRA STRENGTH CAPS Take by mouth.    . lactulose (CEPHULAC) 10 G packet Take 1 packet (10 g total) by mouth daily as needed (constipation). 10 each 0  . levothyroxine (SYNTHROID, LEVOTHROID) 88 MCG tablet Take 88 mcg by mouth daily before breakfast. Reported on 06/24/2015    . Lifitegrast (XIIDRA) 5 % SOLN Apply to eye.    . Linaclotide (LINZESS) 290 MCG CAPS capsule Take 290 mcg by mouth daily.    . magnesium hydroxide (MILK OF MAGNESIA) 400 MG/5ML suspension Take by mouth daily as needed for mild constipation.    . meclizine (ANTIVERT) 25 MG tablet Take by mouth.    . medroxyPROGESTERone (PROVERA) 10 MG tablet Take 1 tablet (10 mg total) by mouth daily. 3 tablet 0  . Menthol-Methyl Salicylate (ICY HOT EXTRA STRENGTH) 10-30 % CREA Apply topically.    . multivitamin-iron-minerals-folic acid (THERAPEUTIC-M) TABS tablet Take 1 tablet by mouth  daily.    . Omega-3 Krill Oil 1000 MG CAPS Take by mouth. Reported on 06/24/2015    . ondansetron (ZOFRAN-ODT) 8 MG disintegrating tablet Take 8 mg by mouth every 8 (eight) hours as needed for nausea or vomiting. Reported on 06/24/2015    . pantoprazole (PROTONIX) 40 MG tablet Take 1 tablet (40 mg total) by mouth 2 (two) times daily before a meal. 60 tablet 0  . PARoxetine (PAXIL) 30 MG tablet Take 30 mg by mouth daily.    . promethazine (PHENERGAN) 25 MG tablet Take 1 tablet (25 mg total) by mouth every 6 (six) hours as needed for nausea or vomiting. 10 tablet 0  . PROPYLENE GLYCOL OP Apply to eye.    . ranitidine ([employer] RANITIDINE) 75  MG tablet Take by mouth.    . simethicone (MYLICON) 80 MG chewable tablet Chew by mouth.    . Spirulina 500 MG TABS Take by mouth.    . terbinafine (LAMISIL) 1 % cream Apply 1 application topically 2 (two) times daily.    Marland Kitchen terconazole (TERAZOL 7) 0.4 % vaginal cream Place 1 applicator vaginally at bedtime. Nightly x 7 nights. 45 g 0  . tiZANidine (ZANAFLEX) 4 MG tablet Take 4 mg by mouth every 6 (six) hours as needed for muscle spasms.    . traZODone (DESYREL) 50 MG tablet Take 1/2 pill at night. May increase to a full pill    . triamcinolone cream (KENALOG) 0.1 % Apply 1 application topically 2 (two) times daily.    Marland Kitchen White Petrolatum-Mineral Oil (GENTEAL TEARS NIGHT-TIME) OINT Apply to eye.    . zolpidem (AMBIEN) 10 MG tablet Take 10 mg by mouth at bedtime as needed for sleep.    Marland Kitchen acetaminophen (RA ACETAMINOPHEN) 650 MG CR tablet Take by mouth.    Marland Kitchen amLODipine (NORVASC) 2.5 MG tablet Take 2.5 mg by mouth daily.    . divalproex (DEPAKOTE ER) 250 MG 24 hr tablet     . divalproex (DEPAKOTE) 125 MG DR tablet     . divalproex (DEPAKOTE) 250 MG DR tablet Take by mouth.    . EPINEPHrine (EPIPEN 2-PAK) 0.3 mg/0.3 mL IJ SOAJ injection Inject 0.3 mg into the skin.    Marland Kitchen EPINEPHrine (EPIPEN 2-PAK) 0.3 mg/0.3 mL IJ SOAJ injection Inject into the muscle.     No current facility-administered medications for this visit.     Allergies as of 12/01/2015 - Review Complete 12/01/2015  Allergen Reaction Noted  . Bee venom Hives 01/20/2015  . Darvon [propoxyphene] Anaphylaxis and Hives 08/07/2012  . Dicyclomine  07/19/2015  . Other Hives 08/07/2012  . Oxycodone-acetaminophen Other (See Comments) 08/07/2012  . Peanuts [peanut oil] Anaphylaxis and Hives 08/07/2012  . Penicillins Hives and Other (See Comments) 08/07/2012  . Percocet [oxycodone-acetaminophen] Nausea And Vomiting and Anxiety 01/15/2013  . Sulfa antibiotics Anaphylaxis, Hives, and Itching 08/07/2012  . Sulfacetamide sodium Anaphylaxis, Hives,  and Itching 10/27/2014  . Ultram [tramadol] Hives 01/15/2013  . Amikacin Nausea And Vomiting 01/15/2013  . Aspirin Hives 01/15/2013  . Bee pollen  05/27/2013  . Doxycycline Photosensitivity 12/02/2014  . Haloperidol Nausea And Vomiting 05/27/2013  . Hymenoptera venom preparations Hives 03/26/2015  . Ibuprofen  10/30/2014  . Imipramine Hives 03/26/2015  . Sulfur Hives 07/31/2013  . Adhesive [tape] Hives and Rash 05/27/2013  . Epinephrine Palpitations 03/26/2015  . Hydroxyzine Anxiety and Itching 06/30/2014  . Prednisone Nausea And Vomiting, Anxiety, and Other (See Comments) 01/15/2013    ROS:  General: Negative for anorexia,  weight loss, fever, chills, fatigue, weakness. ENT: Negative for hoarseness, difficulty swallowing , nasal congestion. CV: Negative for chest pain, angina, palpitations, dyspnea on exertion, peripheral edema.  Respiratory: Negative for dyspnea at rest, dyspnea on exertion, cough, sputum, wheezing.  GI: See history of present illness. GU:  Negative for dysuria, hematuria, urinary incontinence, urinary frequency, nocturnal urination.  Endo: Negative for unusual weight change.    Physical Examination:   BP 130/74   Pulse 72   Temp 98 F (36.7 C) (Oral)   Ht 5\' 3"  (1.6 m)   Wt 122 lb 8 oz (55.6 kg)   BMI 21.70 kg/m   General: Well-nourished, well-developed in no acute distress.  Eyes: No icterus. Conjunctivae pink. Mouth: Oropharyngeal mucosa moist and pink , no lesions erythema or exudate. Lungs: Clear to auscultation bilaterally. Non-labored. Heart: Regular rate and rhythm, no murmurs rubs or gallops.  Abdomen: Bowel sounds are normal, and tenderness with flexion of the abdominal wall muscles without palpation and then expedite she'll increase in tenderness with palpation., nondistended, no hepatosplenomegaly or masses, no abdominal bruits or hernia , no rebound or guarding.   Extremities: No lower extremity edema. No clubbing or deformities. Neuro:  Alert and oriented x 3.  Grossly intact. Skin: Warm and dry, no jaundice.   Psych: Alert and cooperative, normal mood and affect.  Labs:    Imaging Studies: No results found.  Assessment and Plan:   Angela [name] is a 65 y.o. y/o female who comes in today with right-sided pain and rectal bleeding. The patient has been found to have a anal fissure and will continue her Linzess and MiraLAX. The patient was also found to have right-sided abdominal pain caused by muscular skeletal pain which was exacerbated by raising the patient's leg 6 inches above the exam table without even palpating the abdominal wall. The patient then had expedite she'll increase in her pain while palpating with 1 finger. The patient has been explained the cause of her abdominal pain and will use compresses and muscle relaxers as needed. The patient will follow-up as needed.    Lucilla Lame, MD. Marval Regal   Note: This dictation was prepared with Dragon dictation along with smaller phrase technology. Any transcriptional errors that result from this process are unintentional.

## 2015-12-14 ENCOUNTER — Ambulatory Visit: Payer: Medicare Other | Admitting: Physical Therapy

## 2015-12-14 ENCOUNTER — Encounter: Payer: Self-pay | Admitting: Physical Therapy

## 2015-12-14 DIAGNOSIS — M25512 Pain in left shoulder: Secondary | ICD-10-CM

## 2015-12-14 DIAGNOSIS — G8929 Other chronic pain: Secondary | ICD-10-CM

## 2015-12-14 DIAGNOSIS — M542 Cervicalgia: Secondary | ICD-10-CM

## 2015-12-14 DIAGNOSIS — M545 Low back pain: Secondary | ICD-10-CM

## 2015-12-15 NOTE — Therapy (Signed)
[facility] [address] [place], [address] Phone: [phone]   Fax:  [phone]  Physical Therapy Treatment  Patient Details  Name: Angela [name] MRN: [id number] Date of Birth: 1950-05-25 Referring Provider: Osvaldo Human Myrene Buddy, MD  Encounter Date: 12/14/2015      PT End of Session - 12/14/15 1548    Visit Number 3   Number of Visits 9   Date for PT Re-Evaluation 12/17/15   Authorization Type 3   Authorization Time Period 10 (G code)   PT Start Time 1543   PT Stop Time 1630   PT Time Calculation (min) 47 min   Activity Tolerance Patient tolerated treatment well   Behavior During Therapy [name] for tasks assessed/performed      Past Medical History:  Diagnosis Date  . Allergic rhinitis   . Anemia   . Bipolar 1 disorder ([place])   . Blurred vision   . Bronchitis   . Chronic constipation   . Chronic sinusitis   . Depression   . Diverticulitis   . Diverticulosis   . Dizziness   . Endometriosis   . Endometriosis   . Facial pain   . GERD (gastroesophageal reflux disease)   . GERD (gastroesophageal reflux disease)   . Hernia, hiatal   . Hypertension   . Hypothyroidism   . Malignant neoplasm of skin   . Migraine   . Neuropathy ([place])   . Otalgia   . Otitis media   . skin cancer   . Thyroid disease     Past Surgical History:  Procedure Laterality Date  . APPENDECTOMY    . CHOLECYSTECTOMY    . CHOLECYSTECTOMY, LAPAROSCOPIC    . ESOPHAGOGASTRODUODENOSCOPY (EGD) WITH PROPOFOL N/A 03/29/2015   Procedure: ESOPHAGOGASTRODUODENOSCOPY (EGD) WITH PROPOFOL;  Surgeon: Josefine Class, MD;  Location: [facility] ENDOSCOPY;  Service: Endoscopy;  Laterality: N/A;  . HERNIA REPAIR    . laproscopy    . TONSILLECTOMY    . uteral suspension      There were no vitals filed for this visit.      Subjective Assessment - 12/14/15 1546    Subjective Patient reports she is working on exercises  and controlling movements.    Limitations House hold activities;Other (comment)   Patient Stated Goals less pain and relaxation and more mobility, release from muscle tension   Currently in Pain? Yes   Pain Score 8    Pain Location Neck  jaw   Pain Orientation Left   Pain Descriptors / Indicators Aching;Stabbing   Pain Type Chronic pain   Pain Onset More than a month ago   Pain Frequency Constant      Objective; Posture; mild forward head posture, forward rounded shoulders Palpation: tender, spasms bilateral masseter muscles, temporalis muscles, cervical spine and suboccipital muscles  Treatment: Manual therapy: STM/MFR performed to facial muscles, masseter, TMJ region, suboccipital release, cervical spine muscles with patient supine lying: goals: pain, reduce spasms  Therapeutic exercises: patient performed with verbal, tactile cues and demonstration of therapist Supine lying:  cervical spine retraction x 10  Scapular retraction supine and sitting x 10 Upper trapezius stretches supine lying in conjunction with MFR techniques 3 reps  Modalities; Electrical stimulation: high volt clinical protocol for muscle spasms: applied (4) electrodes to upper and mid trapezius muscles bilaterally with moist heat applied to same with patient seated in chair with pillows supporting UE's x 15 min.  Patient response to treatment: Patient demonstrated  decreased spasms and improved soft tissue elasticity in cervical spine and facial muscles/TMJ region with decreased pain from 8/10 to 4/10. No adverse reaction to moist heat noted following treatment.         PT Education - 12/14/15 1548    Education provided Yes   Education Details HEP: scapular retraction, controlled mouth opening   Person(s) Educated Patient   Methods Explanation;Demonstration;Verbal cues   Comprehension Verbalized understanding;Returned demonstration;Verbal cues required             PT Long Term Goals - 11/11/15  1516      PT LONG TERM GOAL #1   Title Pt will be independent with HEP to maximize improvement and decrease risk of reinjury.   Time 4   Period Weeks   Status New     PT LONG TERM GOAL #2   Title Pt will be independent with walking program to maximize overall function and decrease risk of reinjury.   Time 4   Period Weeks   Status New     PT LONG TERM GOAL #3   Title Pt will be able to sleep for 8 hours and be woken up <5 times due to pain to demonstrate improved overall function and to improve overall health.    Time 4   Period Weeks   Status New               Plan - 12/14/15 1631    Clinical Impression Statement Patient is responding well to treatment with manual therapy STM and stretching. She continues with pain, stiffness due to stress in personal life and planning a move soon. She should continue to progress with decreasing pain, improving mobility with good carry over with additional physical therapy intervention.    Rehab Potential Poor   PT Frequency 2x / week   PT Duration 4 weeks   PT Treatment/Interventions ADLs/Self Care Home Management;Cryotherapy;Electrical Stimulation;Iontophoresis 4mg /ml Dexamethasone;Moist Heat;Gait training;Stair training;Functional mobility training;Therapeutic activities;Therapeutic exercise;Aquatic Therapy;Balance training;Neuromuscular re-education;Patient/family education;Manual techniques;Passive range of motion   PT Next Visit Plan manual techniques, STM, therapeutic exercises   PT Home Exercise Plan scapular retraction, cervical retraction, use of cervical roll, lumbar support      Patient will benefit from skilled therapeutic intervention in order to improve the following deficits and impairments:  Decreased activity tolerance, Decreased endurance, Decreased strength, Impaired perceived functional ability, Impaired UE functional use, Pain  Visit Diagnosis: Cervicalgia  Chronic low back pain, unspecified back pain laterality,  with sciatica presence unspecified  Chronic left shoulder pain     Problem List Patient Active Problem List   Diagnosis Date Noted  . Absolute anemia 06/24/2015  . Clinical depression 06/24/2015  . Endometriosis 06/24/2015  . BP (high blood pressure) 06/24/2015  . Headache, migraine 06/24/2015  . Disease of thyroid gland 06/24/2015  . PMB (postmenopausal bleeding) 05/22/2015  . Bipolar 1 disorder ([place]) 04/23/2015  . Anxiety state 04/23/2015  . Colitis, acute 04/20/2015  . Melena 04/20/2015  . Accelerated hypertension 04/20/2015  . TIA (transient ischemic attack) 04/20/2015  . Hypothyroidism 04/20/2015  . GERD (gastroesophageal reflux disease) 04/20/2015  . Chest pain 04/20/2015  . Acute colitis 04/20/2015  . Cervical disc disorder with radiculopathy 03/25/2015  . Cervical pain 03/25/2015  . Pelvic pain in female 10/30/2014  . Bipolar affective disorder ([place]) 06/30/2014  . CA of skin 06/30/2014  . Cephalalgia 12/16/2013  . Neuropathy ([place]) 12/16/2013    Jomarie Longs PT 12/15/2015, 10:14 PM  [place] [facility] [address] [place], [address] Phone: [phone]   Fax:  [phone]  Name: Angela [name] MRN: [id number] Date of Birth: [birth year]

## 2015-12-16 ENCOUNTER — Ambulatory Visit: Payer: Medicare Other | Admitting: Physical Therapy

## 2015-12-21 ENCOUNTER — Encounter: Payer: Self-pay | Admitting: Physical Therapy

## 2015-12-21 ENCOUNTER — Ambulatory Visit: Payer: Medicare Other | Attending: Orthopedic Surgery | Admitting: Physical Therapy

## 2015-12-21 DIAGNOSIS — G8929 Other chronic pain: Secondary | ICD-10-CM | POA: Diagnosis present

## 2015-12-21 DIAGNOSIS — M545 Low back pain: Secondary | ICD-10-CM | POA: Insufficient documentation

## 2015-12-21 DIAGNOSIS — M542 Cervicalgia: Secondary | ICD-10-CM | POA: Insufficient documentation

## 2015-12-21 DIAGNOSIS — M25512 Pain in left shoulder: Secondary | ICD-10-CM | POA: Insufficient documentation

## 2015-12-21 NOTE — Therapy (Signed)
[facility] [address], [place], [address] Phone: [phone]   Fax:  [phone]  Physical Therapy Treatment  Patient Details  Name: Angela [name] MRN: [id number] Date of Birth: Aug 05, 1950 Referring Provider: Osvaldo Human Myrene Buddy, MD  Encounter Date: 12/21/2015      PT End of Session - 12/21/15 1625    Visit Number 4   Number of Visits 12   Date for PT Re-Evaluation 01/18/16   Authorization Type 4   Authorization Time Period 10 (G code)   PT Start Time 1623   PT Stop Time 1711   PT Time Calculation (min) 48 min   Activity Tolerance Patient tolerated treatment well   Behavior During Therapy [facility] for tasks assessed/performed      Past Medical History:  Diagnosis Date  . Allergic rhinitis   . Anemia   . Bipolar 1 disorder ([place])   . Blurred vision   . Bronchitis   . Chronic constipation   . Chronic sinusitis   . Depression   . Diverticulitis   . Diverticulosis   . Dizziness   . Endometriosis   . Endometriosis   . Facial pain   . GERD (gastroesophageal reflux disease)   . GERD (gastroesophageal reflux disease)   . Hernia, hiatal   . Hypertension   . Hypothyroidism   . Malignant neoplasm of skin   . Migraine   . Neuropathy ([place])   . Otalgia   . Otitis media   . skin cancer   . Thyroid disease     Past Surgical History:  Procedure Laterality Date  . APPENDECTOMY    . CHOLECYSTECTOMY    . CHOLECYSTECTOMY, LAPAROSCOPIC    . ESOPHAGOGASTRODUODENOSCOPY (EGD) WITH PROPOFOL N/A 03/29/2015   Procedure: ESOPHAGOGASTRODUODENOSCOPY (EGD) WITH PROPOFOL;  Surgeon: Josefine Class, MD;  Location: [facility] ENDOSCOPY;  Service: Endoscopy;  Laterality: N/A;  . HERNIA REPAIR    . laproscopy    . TONSILLECTOMY    . uteral suspension      There were no vitals filed for this visit.      Subjective Assessment - 12/21/15 1622    Subjective Patient reports she is feeling stressed today,  has an appointment tomorrow that is concerning to her. she is having increased back pain today. She feels therapy is beneficial and because sh has had to miss a few visits would like to continue through the end of this month.(note: patient on phone for 15 min. Or in bathroom  Prior to beginning treatment)   Limitations House hold activities;Other (comment)   Patient Stated Goals less pain and relaxation and more mobility, release from muscle tension   Currently in Pain? Yes   Pain Score 8    Pain Location Neck  and back   Pain Orientation Left;Right   Pain Descriptors / Indicators Aching;Stabbing   Pain Type Chronic pain   Pain Onset More than a month ago   Pain Frequency Constant      Objective; Posture; mild forward head posture, forward rounded shoulders Palpation: tender, spasms bilateral masseter muscles, temporalis muscles, cervical spine and suboccipital muscles  Treatment: Manual therapy: STM/MFR performed to facial muscles, masseter, TMJ region, suboccipital release, cervical spine muscles with patient supine lying: goals: pain, reduce spasms  Therapeutic exercises: patient performed with verbal, tactile cues and demonstration of therapist Supine lying:  Upper trapezius stretches supine lying in conjunction with MFR techniques 3 reps AAROM rotations and side bending bilaterally x  3 reps with hold end range  Modalities; Electrical stimulation: high volt clinical protocol for muscle spasms: applied (4) electrodes to upper and mid trapezius muscles bilaterally with moist heat applied to same with patient seated in massage chair with UE's supported x 15 min.  Patient response to treatment: Patient demonstrated decreased spasms 50% and improved soft tissue elasticity in cervical spine and facial muscles/TMJ region with decreased pain from 8/10 to 3/10. No adverse reaction to moist heat noted following treatment.            PT Education - 12/21/15 1625    Education  provided Yes   Education Details HEP: relaxation techniques, TMJ exercises, posture control, positioning to relieve symptoms   Person(s) Educated Patient   Methods Explanation   Comprehension Verbalized understanding             PT Long Term Goals - 12/21/15 1730      PT LONG TERM GOAL #1   Title Pt will be independent with HEP by 01/18/1016 to maximize improvement and decrease risk of reinjury   Baseline requires moderate cuing and instruction for appropriate exercises and progression as tolerated   Status Revised     PT LONG TERM GOAL #2   Title Pt will be independent with walking program by 01/18/2016 to maximize overall function and decrease risk of reinjury   Baseline not able to perform walking program due to pain in back and time contraints   Status Revised     PT LONG TERM GOAL #3   Title Pt will be able to sleep for 8 hours and be woken up <5 times due to pain by 01/18/2016 to demonstrate improved overall function and to improve overall health.    Baseline continue with difficulty sleeping   Status Revised               Plan - 12/21/15 1626    Clinical Impression Statement Patient has made minimal gains towards goals due to inconsistent attendance due to scheduling conflicts. Patient is responding well to treatment with manual therapy STM and stretching. She continues with pain, stiffness due to stress in personal life and planning a move soon. She should continue to progress with decreasing pain, improving mobility with good carry over with additional physical therapy intervention.    Rehab Potential Poor   Clinical Impairments Affecting Rehab Potential co-morbidities; history of bipolar disorder per medical chart; assistance available at discharge   PT Frequency 2x / week   PT Duration 4 weeks   PT Treatment/Interventions ADLs/Self Care Home Management;Cryotherapy;Electrical Stimulation;Iontophoresis 4mg /ml Dexamethasone;Moist Heat;Gait training;Stair  training;Functional mobility training;Therapeutic activities;Therapeutic exercise;Aquatic Therapy;Balance training;Neuromuscular re-education;Patient/family education;Manual techniques;Passive range of motion   PT Next Visit Plan manual techniques, STM, therapeutic exercises   PT Home Exercise Plan scapular retraction, cervical retraction, use of cervical roll, lumbar support      Patient will benefit from skilled therapeutic intervention in order to improve the following deficits and impairments:  Decreased activity tolerance, Decreased endurance, Decreased strength, Impaired perceived functional ability, Impaired UE functional use, Pain  Visit Diagnosis: Cervicalgia - Plan: PT plan of care cert/re-cert  Chronic low back pain, unspecified back pain laterality, with sciatica presence unspecified - Plan: PT plan of care cert/re-cert  Chronic left shoulder pain - Plan: PT plan of care cert/re-cert     Problem List Patient Active Problem List   Diagnosis Date Noted  . Absolute anemia 06/24/2015  . Clinical depression 06/24/2015  . Endometriosis 06/24/2015  . BP (high blood  pressure) 06/24/2015  . Headache, migraine 06/24/2015  . Disease of thyroid gland 06/24/2015  . PMB (postmenopausal bleeding) 05/22/2015  . Bipolar 1 disorder ([place]) 04/23/2015  . Anxiety state 04/23/2015  . Colitis, acute 04/20/2015  . Melena 04/20/2015  . Accelerated hypertension 04/20/2015  . TIA (transient ischemic attack) 04/20/2015  . Hypothyroidism 04/20/2015  . GERD (gastroesophageal reflux disease) 04/20/2015  . Chest pain 04/20/2015  . Acute colitis 04/20/2015  . Cervical disc disorder with radiculopathy 03/25/2015  . Cervical pain 03/25/2015  . Pelvic pain in female 10/30/2014  . Bipolar affective disorder ([place]) 06/30/2014  . CA of skin 06/30/2014  . Cephalalgia 12/16/2013  . Neuropathy ([place]) 12/16/2013    Jomarie Longs PT 12/22/2015, 6:18 PM  [place]  [facility] [address] [place], [address] Phone: [phone]   Fax:  [phone]  Name: Angela [name] MRN: [id number] Date of Birth: 01-18-1950

## 2015-12-22 ENCOUNTER — Ambulatory Visit: Payer: Medicare Other | Admitting: Physical Therapy

## 2015-12-27 ENCOUNTER — Ambulatory Visit: Payer: Medicare Other | Admitting: Podiatry

## 2015-12-27 ENCOUNTER — Ambulatory Visit: Payer: Medicare Other | Admitting: Physical Therapy

## 2015-12-28 ENCOUNTER — Encounter: Payer: Medicare Other | Admitting: Physical Therapy

## 2015-12-29 ENCOUNTER — Ambulatory Visit: Payer: Medicare Other | Admitting: Physical Therapy

## 2015-12-29 ENCOUNTER — Ambulatory Visit: Payer: Medicare Other | Admitting: Podiatry

## 2015-12-29 ENCOUNTER — Telehealth: Payer: Self-pay | Admitting: Obstetrics and Gynecology

## 2015-12-29 DIAGNOSIS — B3731 Acute candidiasis of vulva and vagina: Secondary | ICD-10-CM

## 2015-12-29 DIAGNOSIS — B373 Candidiasis of vulva and vagina: Secondary | ICD-10-CM

## 2015-12-29 MED ORDER — TERCONAZOLE 0.4 % VA CREA: 1.0000 | TOPICAL_CREAM | Freq: Every day | VAGINAL | 0 refills | Status: DC

## 2015-12-29 NOTE — Telephone Encounter (Signed)
LM FOR PT LETTING HER KNOW RX WAS SENT

## 2015-12-29 NOTE — Telephone Encounter (Signed)
PT CALLED AND SHE WAS GIVEN AN ANTIBIOTIC FROM HER PCP CARE ABOUT 2 WEEKS AGO, AND SHE HAS A VERY SMELLY DISCHARGE AND SHE HAS TAKEN 3 DIFLUCAN ALREADY AND SHE STATED ITS NOT HELPING, SHE WANTED TO KNOW IF DR CHERRY COULD CALL IN THE TERAZOL CREAM, SHE SAID TAHT REALLY WORKS FOR HER, SHE STATED THAT SHE WILL NOT BE ABLE TO COME IN SINCE SHE HAS TO CALL THE [facility]AN 3 DAYS AHEAD OF TIME IN ORDER TO GET HERE, AND SHE DOES HAVE AN APPT TODAY HOME HEALTH AT 11:00 SO IF YOU COULD LET HER KNOW SOMETHING SOONER THAN LATER. I DID LET HER KNOW THAT DR CHERRY WAS OUT OF THE OFFICE AND THAT DR CHERRY'S NURSE WOULD HAVE TO DISCUSS THIS WITH ANOTHER PROVIDER PROBABLY BEFORE SHE CAN CALL SOMETHING IN.

## 2015-12-29 NOTE — Telephone Encounter (Signed)
RX sent in. Thanks

## 2016-01-03 NOTE — Telephone Encounter (Signed)
error 

## 2016-01-05 ENCOUNTER — Encounter: Payer: Medicare Other | Admitting: Physical Therapy

## 2016-01-06 ENCOUNTER — Ambulatory Visit: Payer: Medicare Other | Admitting: Physical Therapy

## 2016-01-12 ENCOUNTER — Emergency Department: Payer: Medicare Other

## 2016-01-12 ENCOUNTER — Emergency Department
Admission: EM | Admit: 2016-01-12 | Discharge: 2016-01-13 | Disposition: A | Discharge status: Home / Self Care | Payer: Medicare Other | Attending: Emergency Medicine | Admitting: Emergency Medicine

## 2016-01-12 ENCOUNTER — Encounter: Payer: Medicare Other | Admitting: Physical Therapy

## 2016-01-12 ENCOUNTER — Ambulatory Visit: Payer: Medicare Other | Admitting: Physical Therapy

## 2016-01-12 DIAGNOSIS — S199XXA Unspecified injury of neck, initial encounter: Secondary | ICD-10-CM | POA: Diagnosis present

## 2016-01-12 DIAGNOSIS — S161XXA Strain of muscle, fascia and tendon at neck level, initial encounter: Secondary | ICD-10-CM | POA: Diagnosis not present

## 2016-01-12 DIAGNOSIS — S80212A Abrasion, left knee, initial encounter: Secondary | ICD-10-CM | POA: Insufficient documentation

## 2016-01-12 DIAGNOSIS — W01198A Fall on same level from slipping, tripping and stumbling with subsequent striking against other object, initial encounter: Secondary | ICD-10-CM | POA: Diagnosis not present

## 2016-01-12 DIAGNOSIS — S80211A Abrasion, right knee, initial encounter: Secondary | ICD-10-CM | POA: Insufficient documentation

## 2016-01-12 DIAGNOSIS — I10 Essential (primary) hypertension: Secondary | ICD-10-CM | POA: Insufficient documentation

## 2016-01-12 DIAGNOSIS — Z79899 Other long term (current) drug therapy: Secondary | ICD-10-CM | POA: Diagnosis not present

## 2016-01-12 DIAGNOSIS — Y939 Activity, unspecified: Secondary | ICD-10-CM | POA: Diagnosis not present

## 2016-01-12 DIAGNOSIS — E039 Hypothyroidism, unspecified: Secondary | ICD-10-CM | POA: Diagnosis not present

## 2016-01-12 DIAGNOSIS — Z87891 Personal history of nicotine dependence: Secondary | ICD-10-CM | POA: Diagnosis not present

## 2016-01-12 DIAGNOSIS — T148XXA Other injury of unspecified body region, initial encounter: Secondary | ICD-10-CM

## 2016-01-12 DIAGNOSIS — S0990XA Unspecified injury of head, initial encounter: Secondary | ICD-10-CM | POA: Insufficient documentation

## 2016-01-12 DIAGNOSIS — Y999 Unspecified external cause status: Secondary | ICD-10-CM | POA: Insufficient documentation

## 2016-01-12 DIAGNOSIS — Y92002 Bathroom of unspecified non-institutional (private) residence single-family (private) house as the place of occurrence of the external cause: Secondary | ICD-10-CM | POA: Insufficient documentation

## 2016-01-12 DIAGNOSIS — S0083XA Contusion of other part of head, initial encounter: Secondary | ICD-10-CM | POA: Diagnosis not present

## 2016-01-12 DIAGNOSIS — W19XXXA Unspecified fall, initial encounter: Secondary | ICD-10-CM

## 2016-01-12 NOTE — ED Notes (Signed)
Pt was asked to change into hospital gown for xray, pt refused to change into gown at this time. Spoke with xray who states pt has to be in gown. Will notify pt of this.

## 2016-01-12 NOTE — ED Notes (Signed)
Pt returned from CT °

## 2016-01-12 NOTE — ED Notes (Signed)
Explained to pt xray needs her to be in a gown to make sure they get a good xray. Pt verbalized understanding, placed in gown and xray called that pt is ready.

## 2016-01-12 NOTE — ED Triage Notes (Signed)
Pt bib EMS w/ c/o mechanical fall, pt sts that she tripped and hit head against toilet.  Hematoma noted to L forehead.  Pt denies LOC.  Pt c/o neck pain and back pain, pt arrived in C-collar placed by EMS.  Pt alert and oriented x 4, pt able to move all limbs on command.  Pt denies incontinence.

## 2016-01-12 NOTE — ED Notes (Signed)
Pt ambulated with assistance to BR  

## 2016-01-12 NOTE — ED Provider Notes (Signed)
[facility] Emergency Department Provider Note   ____________________________________________   First MD Initiated Contact with Patient 01/12/16 2312     (approximate)  I have reviewed the triage vital signs and the nursing notes.   HISTORY  Chief Complaint Fall    HPI JUANITTA HELL is a 65 y.o. female who presents to the ED from boarding home via EMS with a chief complaint of fall. Patient reports tripping and falling in the restroom, striking her forehead against the toilet. Struck her ribs in the process by twisting. Complains of head, neck and bilateral knee pain. Denies LOC. Able to ambulate after the fall. Denies vision changes, chest pain, shortness of breath, abdominal pain, nausea, vomiting, diarrhea. Denies recent travel. Nothing makes her symptoms better. Movement makes her symptoms worse.   Past Medical History:  Diagnosis Date  . Allergic rhinitis   . Anemia   . Bipolar 1 disorder ([place])   . Blurred vision   . Bronchitis   . Chronic constipation   . Chronic sinusitis   . Depression   . Diverticulitis   . Diverticulosis   . Dizziness   . Endometriosis   . Endometriosis   . Facial pain   . GERD (gastroesophageal reflux disease)   . GERD (gastroesophageal reflux disease)   . Hernia, hiatal   . Hypertension   . Hypothyroidism   . Malignant neoplasm of skin   . Migraine   . Neuropathy ([place])   . Otalgia   . Otitis media   . skin cancer   . Thyroid disease     Patient Active Problem List   Diagnosis Date Noted  . Absolute anemia 06/24/2015  . Clinical depression 06/24/2015  . Endometriosis 06/24/2015  . BP (high blood pressure) 06/24/2015  . Headache, migraine 06/24/2015  . Disease of thyroid gland 06/24/2015  . PMB (postmenopausal bleeding) 05/22/2015  . Bipolar 1 disorder ([place]) 04/23/2015  . Anxiety state 04/23/2015  . Colitis, acute 04/20/2015  . Melena 04/20/2015  . Accelerated hypertension 04/20/2015  . TIA  (transient ischemic attack) 04/20/2015  . Hypothyroidism 04/20/2015  . GERD (gastroesophageal reflux disease) 04/20/2015  . Chest pain 04/20/2015  . Acute colitis 04/20/2015  . Cervical disc disorder with radiculopathy 03/25/2015  . Cervical pain 03/25/2015  . Pelvic pain in female 10/30/2014  . Bipolar affective disorder ([place]) 06/30/2014  . CA of skin 06/30/2014  . Cephalalgia 12/16/2013  . Neuropathy ([name]) 12/16/2013    Past Surgical History:  Procedure Laterality Date  . APPENDECTOMY    . CHOLECYSTECTOMY    . CHOLECYSTECTOMY, LAPAROSCOPIC    . ESOPHAGOGASTRODUODENOSCOPY (EGD) WITH PROPOFOL N/A 03/29/2015   Procedure: ESOPHAGOGASTRODUODENOSCOPY (EGD) WITH PROPOFOL;  Surgeon: Josefine Class, MD;  [facility];  Service: Endoscopy;  Laterality: N/A;  . HERNIA REPAIR    . laproscopy    . TONSILLECTOMY    . uteral suspension      Prior to Admission medications   Medication Sig Start Date End Date Taking? Authorizing Provider  acetaminophen (RA ACETAMINOPHEN) 650 MG CR tablet Take by mouth. 02/03/15   Historical Provider, MD  amLODipine (NORVASC) 2.5 MG tablet Take 2.5 mg by mouth daily.    Historical Provider, MD  ammonium lactate (AMLACTIN) 12 % cream  10/05/15   Historical Provider, MD  Biotin 5000 MCG CAPS Take 1 capsule by mouth daily.     Historical Provider, MD  calcium carbonate (OS-CAL) 600 MG TABS tablet Take 600 mg by mouth 3 (three) times  daily with meals.    Historical Provider, MD  cetirizine (ZYRTEC) 10 MG tablet Take 10 mg by mouth daily.    Historical Provider, MD  Cholecalciferol (VITAMIN D-1000 MAX ST) 1000 units tablet Take by mouth. 02/03/15   Historical Provider, MD  conjugated estrogens (PREMARIN) vaginal cream Apply a pea sized amount to the labia and vagina daily as needed 10/07/15   Rubie Maid, MD  conjugated estrogens (PREMARIN) vaginal cream Place 1 Applicatorful vaginally daily. 10/08/15   Rubie Maid, MD  dicyclomine (BENTYL) 10 MG  capsule Take 1 capsule (10 mg total) by mouth 3 (three) times daily as needed for spasms. 07/06/15   Lucilla Lame, MD  divalproex (DEPAKOTE ER) 250 MG 24 hr tablet  09/22/15   Historical Provider, MD  divalproex (DEPAKOTE) 125 MG DR tablet  09/22/15   Historical Provider, MD  divalproex (DEPAKOTE) 250 MG DR tablet Take by mouth.    Historical Provider, MD  divalproex (DEPAKOTE) 500 MG DR tablet Take 500 mg by mouth 3 (three) times daily.    Historical Provider, MD  EPINEPHrine (EPIPEN 2-PAK) 0.3 mg/0.3 mL IJ SOAJ injection Inject 0.3 mg into the skin.    Historical Provider, MD  EPINEPHrine (EPIPEN 2-PAK) 0.3 mg/0.3 mL IJ SOAJ injection Inject into the muscle. 04/08/14   Historical Provider, MD  erythromycin ophthalmic ointment 1 application at bedtime.    Historical Provider, MD  fluticasone Asencion Islam) 50 MCG/ACT nasal spray  10/06/15   Historical Provider, MD  HYDROcodone-acetaminophen (NORCO/VICODIN) 5-325 MG tablet Take 1-2 tablets by mouth every 6 (six) hours as needed for moderate pain. Reported on 06/24/2015 10/07/15   Rubie Maid, MD  hydrocortisone (ANUSOL-HC) 2.5 % rectal cream Place 1 application rectally 2 (two) times daily.    Historical Provider, MD  Hypromellose (GENTEAL MILD) 0.2 % SOLN Apply to eye.    Historical Provider, MD  ipratropium (ATROVENT) 0.03 % nasal spray Place 2 sprays into both nostrils every 12 (twelve) hours.    Historical Provider, MD  IRON PO Take by mouth every other day. Reported on 06/24/2015    Historical Provider, MD  Iron-FA-B Cmp-C-Biot-Probiotic (FUSION PLUS) CAPS Take 130 mg by mouth daily. 06/17/15   Rubie Maid, MD  LACTOBACILLUS EXTRA STRENGTH CAPS Take by mouth.    Historical Provider, MD  lactulose (CEPHULAC) 10 G packet Take 1 packet (10 g total) by mouth daily as needed (constipation). 06/03/14   Orbie Pyo, MD  levothyroxine (SYNTHROID, LEVOTHROID) 88 MCG tablet Take 88 mcg by mouth daily before breakfast. Reported on 06/24/2015    Historical  Provider, MD  Lifitegrast Shirley Friar) 5 % SOLN Apply to eye.    Historical Provider, MD  Linaclotide Rolan Lipa) 290 MCG CAPS capsule Take 290 mcg by mouth daily.    Historical Provider, MD  magnesium hydroxide (MILK OF MAGNESIA) 400 MG/5ML suspension Take by mouth daily as needed for mild constipation.    Historical Provider, MD  meclizine (ANTIVERT) 25 MG tablet Take by mouth. 08/12/15   Historical Provider, MD  medroxyPROGESTERone (PROVERA) 10 MG tablet Take 1 tablet (10 mg total) by mouth daily. 04/26/15   Demetrios Loll, MD  Menthol-Methyl Salicylate (ICY HOT EXTRA STRENGTH) 10-30 % CREA Apply topically.    Historical Provider, MD  multivitamin-iron-minerals-folic acid (THERAPEUTIC-M) TABS tablet Take 1 tablet by mouth daily.    Historical Provider, MD  Omega-3 Krill Oil 1000 MG CAPS Take by mouth. Reported on 06/24/2015    Historical Provider, MD  ondansetron (ZOFRAN-ODT) 8 MG disintegrating tablet Take  8 mg by mouth every 8 (eight) hours as needed for nausea or vomiting. Reported on 06/24/2015    Historical Provider, MD  pantoprazole (PROTONIX) 40 MG tablet Take 1 tablet (40 mg total) by mouth 2 (two) times daily before a meal. 04/26/15   Demetrios Loll, MD  PARoxetine (PAXIL) 30 MG tablet Take 30 mg by mouth daily.    Historical Provider, MD  promethazine (PHENERGAN) 25 MG tablet Take 1 tablet (25 mg total) by mouth every 6 (six) hours as needed for nausea or vomiting. 04/01/15   Jenise V Bacon Menshew, PA-C  PROPYLENE GLYCOL OP Apply to eye.    Historical Provider, MD  ranitidine ([employer] RANITIDINE) 75 MG tablet Take by mouth.    Historical Provider, MD  simethicone (MYLICON) 80 MG chewable tablet Chew by mouth. 08/10/15 02/06/16  Historical Provider, MD  Spirulina 500 MG TABS Take by mouth.    Historical Provider, MD  terbinafine (LAMISIL) 1 % cream Apply 1 application topically 2 (two) times daily.    Historical Provider, MD  terconazole (TERAZOL 7) 0.4 % vaginal cream Place 1 applicator vaginally at bedtime.  Nightly x 7 nights. 12/29/15   Rubie Maid, MD  tiZANidine (ZANAFLEX) 4 MG tablet Take 4 mg by mouth every 6 (six) hours as needed for muscle spasms.    Historical Provider, MD  traZODone (DESYREL) 50 MG tablet Take 1/2 pill at night. May increase to a full pill 08/27/15   Historical Provider, MD  triamcinolone cream (KENALOG) 0.1 % Apply 1 application topically 2 (two) times daily.    Historical Provider, MD  White Petrolatum-Mineral Oil (GENTEAL TEARS NIGHT-TIME) OINT Apply to eye.    Historical Provider, MD  zolpidem (AMBIEN) 10 MG tablet Take 10 mg by mouth at bedtime as needed for sleep.    Historical Provider, MD    Allergies Bee venom; Darvon [propoxyphene]; Dicyclomine; Other; Oxycodone-acetaminophen; Peanuts [peanut oil]; Penicillins; Percocet [oxycodone-acetaminophen]; Sulfa antibiotics; Sulfacetamide sodium; Ultram [tramadol]; Amikacin; Aspirin; Bee pollen; Doxycycline; Haloperidol; Hymenoptera venom preparations; Ibuprofen; Imipramine; Sulfur; Adhesive [tape]; Epinephrine; Hydroxyzine; and Prednisone  Family History  Problem Relation Age of Onset  . Heart disease Father   . Cancer Mother     lung  . Urolithiasis Neg Hx   . Kidney disease Neg Hx   . Kidney cancer Neg Hx   . Prostate cancer Neg Hx     Social History Social History  Substance Use Topics  . Smoking status: Former Research scientist (life sciences)  . Smokeless tobacco: Never Used  . Alcohol use No    Review of Systems  Constitutional: No fever/chills. Eyes: No visual changes. ENT: Positive for forehead hematoma. No sore throat. Cardiovascular: Positive for left rib pain. Respiratory: Denies shortness of breath. Gastrointestinal: No abdominal pain.  No nausea, no vomiting.  No diarrhea.  No constipation. Genitourinary: Negative for dysuria. Musculoskeletal: Positive for neck pain. Skin: Negative for rash. Neurological: Negative for headaches, focal weakness or numbness.  10-point ROS otherwise  negative.  ____________________________________________   PHYSICAL EXAM:  VITAL SIGNS: ED Triage Vitals [01/12/16 2155]  Enc Vitals Group     BP (!) 161/90     Pulse Rate 63     Resp 18     Temp 98 F (36.7 C)     Temp Source Oral     SpO2 100 %     Weight 125 lb (56.7 kg)     Height 5\' 4"  (1.626 m)     Head Circumference      Peak Flow  Pain Score 10     Pain Loc      Pain Edu?      Excl. in [place]?     Constitutional: Alert and oriented. Disheleved appearing and in no acute distress. Eyes: Conjunctivae are normal. PERRL. EOMI. Head: Small left forehead hematoma. Nose: No congestion/rhinnorhea. Mouth/Throat: Mucous membranes are moist.  Oropharynx non-erythematous. Neck: No stridor.  No cervical spine tenderness to palpation.  No stepoffs or deformities. Cardiovascular: Normal rate, regular rhythm. Grossly normal heart sounds.  Good peripheral circulation. Respiratory: Normal respiratory effort.  No retractions. Lungs splinting but CTAB.  Left anterior and lateral ribs tender to palpation. No crepitus. Gastrointestinal: Soft and nontender to palpation without rebound or guarding. No distention. No abdominal bruits. No CVA tenderness. Musculoskeletal: Minor abrasions to bilateral knees.  No lower extremity tenderness nor edema.  No joint effusions. Neurologic:  Normal speech and language. No gross focal neurologic deficits are appreciated. No gait instability. Ambulates with walker. Skin:  Skin is warm, dry and intact. No rash noted. Psychiatric: Mood and affect are normal. Speech and behavior are normal.  ____________________________________________   LABS (all labs ordered are listed, but only abnormal results are displayed)  Labs Reviewed - No data to display ____________________________________________  EKG  None ____________________________________________  RADIOLOGY  CT head and cervical spine interpreted per Dr. Dorann Lodge:  CT HEAD: Small LEFT frontal  scalp hematoma. No skull fracture.    No acute intracranial process. New age indeterminate RIGHT caudate  lacunar infarct.    Otherwise similar examination including moderate global parenchymal  brain volume loss.    CT CERVICAL SPINE: No acute fracture. Minimal grade 1 C5-6 and C6-7  anterolisthesis on degenerative basis.   Left Ribs (viewed by me, interpreted per Dr. Radene Knee):  1. No displaced rib fracture seen.  2. Moderate hiatal hernia noted.   ____________________________________________   PROCEDURES  Procedure(s) performed: None  Procedures  Critical Care performed: No  ____________________________________________   INITIAL IMPRESSION / ASSESSMENT AND PLAN / ED COURSE  Pertinent labs & imaging results that were available during my care of the patient were reviewed by me and considered in my medical decision making (see chart for details).  65 year old female who presents s/p mechanical fall. Imaging studies are negative for acute traumatic injuries. Patient tells me she was evicted from her boarding house at midnight and is demanding transfer to [facility]. We discussed the fact that her injuries do not necessitate hospitalization, much less transfer to tertiary care center. Next she requested placement for "people to take care of me". We discussed that this is typically done on an outpatient basis through her PCP. She has friends that can pick her up but patient does not want to call them until morning. Explained to patient that she may wait in another part of the emergency department to await her ride in the morning. She is ambulatory with her walker. Strict return precautions given. Patient verbalizes understanding and agrees with plan of care.  Clinical Course as of Jan 12 553  Thu Jan 13, 2016  0127 Patient has a ride coming in the morning. Noted patient is hypertensive. She is asking for a Flexeril to help her relax and sleep.  [JS]  [id number] Blood pressure improved  after Flexeril. Patient has been ambulatory with her walker to the restroom. Her friend is coming shortly to drive her home.  [JS]    Clinical Course User Index [JS] Paulette Blanch, MD     ____________________________________________  FINAL CLINICAL IMPRESSION(S) / ED DIAGNOSES  Final diagnoses:  Fall, initial encounter  Contusion of face, initial encounter  Minor head injury, initial encounter  Abrasion  Acute strain of neck muscle, initial encounter      NEW MEDICATIONS STARTED DURING THIS VISIT:  New Prescriptions   No medications on file     Note:  This document was prepared using Dragon voice recognition software and may include unintentional dictation errors.    Paulette Blanch, MD 01/13/16 [phone]

## 2016-01-12 NOTE — ED Notes (Signed)
Per CT and confirmation from EDP, pt is clear to go to xray.

## 2016-01-13 DIAGNOSIS — S161XXA Strain of muscle, fascia and tendon at neck level, initial encounter: Secondary | ICD-10-CM | POA: Diagnosis not present

## 2016-01-13 MED ORDER — ONDANSETRON 4 MG PO TBDP
4.0000 mg | ORAL_TABLET | Freq: Once | ORAL | Status: AC
  Administered 2016-01-13: 4 mg via ORAL
  Filled 2016-01-13: qty 1

## 2016-01-13 MED ORDER — CYCLOBENZAPRINE HCL 10 MG PO TABS
5.0000 mg | ORAL_TABLET | Freq: Once | ORAL | Status: AC
  Administered 2016-01-13: 5 mg via ORAL
  Filled 2016-01-13: qty 1

## 2016-01-13 MED ORDER — HYDROCODONE-ACETAMINOPHEN 5-325 MG PO TABS
1.0000 | ORAL_TABLET | Freq: Once | ORAL | Status: AC
  Administered 2016-01-13: 1 via ORAL
  Filled 2016-01-13: qty 1

## 2016-01-13 MED ORDER — CLONIDINE HCL 0.1 MG PO TABS
0.1000 mg | ORAL_TABLET | Freq: Once | ORAL | Status: DC
Start: 2016-01-13 — End: 2016-01-13

## 2016-01-13 NOTE — Discharge Instructions (Signed)
Use your walker for balance as she walked. Apply ice to affected area several times daily. Return to the ER for worsening symptoms, persistent vomiting, lethargy or other concerns.

## 2016-01-13 NOTE — ED Notes (Signed)
Pt discharged to home.  Friend driving.  Discharge instructions reviewed.  Verbalized understanding.  No questions or concerns at this time.  Teach back verified.  Pt in NAD.  No items left in ED.   

## 2016-01-19 ENCOUNTER — Encounter: Payer: Medicare Other | Admitting: Physical Therapy

## 2016-01-23 ENCOUNTER — Emergency Department
Admission: EM | Admit: 2016-01-23 | Discharge: 2016-01-25 | Disposition: A | Discharge status: Home / Self Care | Payer: Medicare Other | Attending: Emergency Medicine | Admitting: Emergency Medicine

## 2016-01-23 DIAGNOSIS — Z87891 Personal history of nicotine dependence: Secondary | ICD-10-CM | POA: Insufficient documentation

## 2016-01-23 DIAGNOSIS — E039 Hypothyroidism, unspecified: Secondary | ICD-10-CM | POA: Diagnosis not present

## 2016-01-23 DIAGNOSIS — Z79899 Other long term (current) drug therapy: Secondary | ICD-10-CM | POA: Diagnosis not present

## 2016-01-23 DIAGNOSIS — F32A Depression, unspecified: Secondary | ICD-10-CM

## 2016-01-23 DIAGNOSIS — Z5900 Homelessness unspecified: Secondary | ICD-10-CM

## 2016-01-23 DIAGNOSIS — I10 Essential (primary) hypertension: Secondary | ICD-10-CM | POA: Insufficient documentation

## 2016-01-23 DIAGNOSIS — G8929 Other chronic pain: Secondary | ICD-10-CM | POA: Diagnosis not present

## 2016-01-23 DIAGNOSIS — F329 Major depressive disorder, single episode, unspecified: Secondary | ICD-10-CM

## 2016-01-23 DIAGNOSIS — F3164 Bipolar disorder, current episode mixed, severe, with psychotic features: Secondary | ICD-10-CM | POA: Diagnosis not present

## 2016-01-23 DIAGNOSIS — F609 Personality disorder, unspecified: Secondary | ICD-10-CM | POA: Diagnosis not present

## 2016-01-23 DIAGNOSIS — Z85828 Personal history of other malignant neoplasm of skin: Secondary | ICD-10-CM | POA: Insufficient documentation

## 2016-01-23 DIAGNOSIS — F319 Bipolar disorder, unspecified: Secondary | ICD-10-CM

## 2016-01-23 DIAGNOSIS — F3163 Bipolar disorder, current episode mixed, severe, without psychotic features: Secondary | ICD-10-CM

## 2016-01-23 LAB — URINALYSIS, COMPLETE (UACMP) WITH MICROSCOPIC
Bacteria, UA: NONE SEEN
Bilirubin Urine: NEGATIVE
GLUCOSE, UA: NEGATIVE mg/dL
Hgb urine dipstick: NEGATIVE
Ketones, ur: NEGATIVE mg/dL
Nitrite: NEGATIVE
PROTEIN: NEGATIVE mg/dL
SQUAMOUS EPITHELIAL / LPF: NONE SEEN
Specific Gravity, Urine: 1.004 — ABNORMAL LOW (ref 1.005–1.030)
pH: 7 (ref 5.0–8.0)

## 2016-01-23 LAB — ETHANOL

## 2016-01-23 LAB — COMPREHENSIVE METABOLIC PANEL
ALK PHOS: 102 U/L (ref 38–126)
ALT: 23 U/L (ref 14–54)
AST: 32 U/L (ref 15–41)
Albumin: 4.4 g/dL (ref 3.5–5.0)
Anion gap: 8 (ref 5–15)
BILIRUBIN TOTAL: 0.4 mg/dL (ref 0.3–1.2)
BUN: 16 mg/dL (ref 6–20)
CO2: 28 mmol/L (ref 22–32)
CREATININE: 0.94 mg/dL (ref 0.44–1.00)
Calcium: 9.8 mg/dL (ref 8.9–10.3)
Chloride: 97 mmol/L — ABNORMAL LOW (ref 101–111)
Glucose, Bld: 104 mg/dL — ABNORMAL HIGH (ref 65–99)
Potassium: 3.7 mmol/L (ref 3.5–5.1)
Sodium: 133 mmol/L — ABNORMAL LOW (ref 135–145)
TOTAL PROTEIN: 7.8 g/dL (ref 6.5–8.1)

## 2016-01-23 LAB — CBC WITH DIFFERENTIAL/PLATELET
BASOS ABS: 0.1 10*3/uL (ref 0–0.1)
Basophils Relative: 1 %
EOS ABS: 0.2 10*3/uL (ref 0–0.7)
EOS PCT: 3 %
HCT: 40.1 % (ref 35.0–47.0)
Hemoglobin: 13.8 g/dL (ref 12.0–16.0)
Lymphocytes Relative: 24 %
Lymphs Abs: 1.7 10*3/uL (ref 1.0–3.6)
MCH: 31.8 pg (ref 26.0–34.0)
MCHC: 34.5 g/dL (ref 32.0–36.0)
MCV: 92.4 fL (ref 80.0–100.0)
MONO ABS: 0.8 10*3/uL (ref 0.2–0.9)
Monocytes Relative: 12 %
Neutro Abs: 4.3 10*3/uL (ref 1.4–6.5)
Neutrophils Relative %: 60 %
PLATELETS: 370 10*3/uL (ref 150–440)
RBC: 4.34 MIL/uL (ref 3.80–5.20)
RDW: 14.1 % (ref 11.5–14.5)
WBC: 7 10*3/uL (ref 3.6–11.0)

## 2016-01-23 LAB — URINE DRUG SCREEN, QUALITATIVE (ARMC ONLY)
AMPHETAMINES, UR SCREEN: NOT DETECTED
Barbiturates, Ur Screen: NOT DETECTED
Benzodiazepine, Ur Scrn: NOT DETECTED
CANNABINOID 50 NG, UR ~~LOC~~: NOT DETECTED
Cocaine Metabolite,Ur ~~LOC~~: NOT DETECTED
MDMA (ECSTASY) UR SCREEN: NOT DETECTED
Methadone Scn, Ur: NOT DETECTED
Opiate, Ur Screen: NOT DETECTED
Phencyclidine (PCP) Ur S: NOT DETECTED
TRICYCLIC, UR SCREEN: NOT DETECTED

## 2016-01-23 LAB — ACETAMINOPHEN LEVEL: ACETAMINOPHEN (TYLENOL), SERUM: 10 ug/mL (ref 10–30)

## 2016-01-23 LAB — SALICYLATE LEVEL

## 2016-01-23 MED ORDER — LORAZEPAM 1 MG PO TABS
1.0000 mg | ORAL_TABLET | Freq: Once | ORAL | Status: AC
  Administered 2016-01-23: 1 mg via ORAL
  Filled 2016-01-23: qty 1

## 2016-01-23 MED ORDER — TIZANIDINE HCL 4 MG PO TABS
4.0000 mg | ORAL_TABLET | Freq: Once | ORAL | Status: DC
  Filled 2016-01-23: qty 1

## 2016-01-23 MED ORDER — TRAZODONE HCL 50 MG PO TABS
50.0000 mg | ORAL_TABLET | Freq: Once | ORAL | Status: DC
  Filled 2016-01-23: qty 1

## 2016-01-23 NOTE — ED Notes (Signed)
Patient is voluntary and is pending psych consult.
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
## 2016-01-23 NOTE — ED Notes (Signed)
Pt given warm blanket.

## 2016-01-23 NOTE — ED Notes (Signed)
Pt given sandwich tray and a sprite.

## 2016-01-23 NOTE — ED Notes (Signed)
Pt up to bathroom with assistance form this tech. Pt urinated on self. Assisted pt to clean self and change into clean burgundy scrubs.
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
## 2016-01-24 ENCOUNTER — Encounter: Payer: Medicare Other | Admitting: Physical Therapy

## 2016-01-24 DIAGNOSIS — F609 Personality disorder, unspecified: Secondary | ICD-10-CM

## 2016-01-24 DIAGNOSIS — F3341 Major depressive disorder, recurrent, in partial remission: Secondary | ICD-10-CM | POA: Insufficient documentation

## 2016-01-24 DIAGNOSIS — F3164 Bipolar disorder, current episode mixed, severe, with psychotic features: Secondary | ICD-10-CM | POA: Diagnosis not present

## 2016-01-24 DIAGNOSIS — F319 Bipolar disorder, unspecified: Secondary | ICD-10-CM

## 2016-01-24 MED ORDER — AMLODIPINE BESYLATE 5 MG PO TABS
2.5000 mg | ORAL_TABLET | Freq: Every day | ORAL | Status: DC
  Administered 2016-01-24 – 2016-01-25 (×2): 2.5 mg via ORAL
  Filled 2016-01-24 (×2): qty 1

## 2016-01-24 MED ORDER — PANTOPRAZOLE SODIUM 40 MG PO TBEC
DELAYED_RELEASE_TABLET | ORAL | Status: AC
  Administered 2016-01-24: 40 mg via ORAL
  Filled 2016-01-24: qty 1

## 2016-01-24 MED ORDER — PAROXETINE HCL 20 MG PO TABS
40.0000 mg | ORAL_TABLET | Freq: Every day | ORAL | Status: DC
  Administered 2016-01-24 – 2016-01-25 (×2): 40 mg via ORAL
  Filled 2016-01-24 (×2): qty 2

## 2016-01-24 MED ORDER — ZIPRASIDONE MESYLATE 20 MG IM SOLR
10.0000 mg | Freq: Once | INTRAMUSCULAR | Status: AC
  Administered 2016-01-24: 10 mg via INTRAMUSCULAR
  Filled 2016-01-24: qty 20

## 2016-01-24 MED ORDER — TIZANIDINE HCL 4 MG PO TABS
4.0000 mg | ORAL_TABLET | Freq: Once | ORAL | Status: AC
  Administered 2016-01-24: 4 mg via ORAL
  Filled 2016-01-24: qty 1

## 2016-01-24 MED ORDER — DIVALPROEX SODIUM ER 250 MG PO TB24
250.0000 mg | ORAL_TABLET | Freq: Every day | ORAL | Status: DC
Start: 2016-01-24 — End: 2016-01-25
  Administered 2016-01-24: 250 mg via ORAL
  Filled 2016-01-24 (×3): qty 1

## 2016-01-24 MED ORDER — TRAZODONE HCL 50 MG PO TABS
25.0000 mg | ORAL_TABLET | Freq: Once | ORAL | Status: AC
  Administered 2016-01-24: 25 mg via ORAL
  Filled 2016-01-24: qty 1

## 2016-01-24 MED ORDER — LEVOTHYROXINE SODIUM 88 MCG PO TABS
88.0000 ug | ORAL_TABLET | Freq: Every day | ORAL | Status: DC
  Administered 2016-01-24 – 2016-01-25 (×2): 88 ug via ORAL
  Filled 2016-01-24: qty 1

## 2016-01-24 MED ORDER — POLYETHYLENE GLYCOL 3350 17 G PO PACK
17.0000 g | PACK | Freq: Every day | ORAL | Status: DC
  Administered 2016-01-24 – 2016-01-25 (×2): 17 g via ORAL
  Filled 2016-01-24 (×2): qty 1

## 2016-01-24 MED ORDER — LEVOTHYROXINE SODIUM 50 MCG PO TABS
ORAL_TABLET | ORAL | Status: AC
  Administered 2016-01-24: 88 ug via ORAL
  Filled 2016-01-24: qty 2

## 2016-01-24 MED ORDER — DIVALPROEX SODIUM ER 500 MG PO TB24
ORAL_TABLET | ORAL | Status: AC
  Filled 2016-01-24: qty 1

## 2016-01-24 MED ORDER — PANTOPRAZOLE SODIUM 40 MG PO TBEC
40.0000 mg | DELAYED_RELEASE_TABLET | Freq: Two times a day (BID) | ORAL | Status: DC
  Administered 2016-01-24 – 2016-01-25 (×2): 40 mg via ORAL
  Filled 2016-01-24: qty 1

## 2016-01-24 MED ORDER — TIZANIDINE HCL 4 MG PO TABS
4.0000 mg | ORAL_TABLET | Freq: Four times a day (QID) | ORAL | Status: DC | PRN
  Administered 2016-01-25: 4 mg via ORAL
  Filled 2016-01-24: qty 1

## 2016-01-24 NOTE — ED Notes (Signed)
Departure condition documentation below was in error.  Documentation on wrong patient.

## 2016-01-24 NOTE — ED Notes (Signed)
Miralax given to patient, see MAR.  Prior to administration, patient asked what her choice of liquid was for dilution, patient stated it was water.  Miralax dissolved in water as per patient preference.  While giving patient Miralax, patient added that her "GI specialist upstairs always recommends that she take a double dose when she has missed a day or two".  Patient informed that only one dose was ordered.

## 2016-01-24 NOTE — ED Notes (Addendum)
[place] from [employer] called her contact number is [phone].  She is patient's Education officer, museum.  She had requested a psychiatric evaluation for patient.

## 2016-01-24 NOTE — ED Notes (Signed)
Patient states she ambulates with a walker, so she is unable to transfer to [facility].  Patient is able to ambulate without a walker, however she reaches for the wall to help stabilize gait.  Patient does have history of frequent falls.

## 2016-01-24 NOTE — ED Notes (Signed)
Patient continues to debate plan of care.  Patient is anxious and continually adds on demands.  Plan of care reviewed with minimal effect.  Dr. Quentin Cornwall at bedside evaluating patient.

## 2016-01-24 NOTE — Consult Note (Signed)
[place] [facility] Consult   Reason for Consult:  Consult for 66 year old woman with a history of mood instability who came to the hospital because of frustration at her group home Referring Physician:  Mc[place]ane Patient Identification: Angela [name] MRN:  [id number] Principal Diagnosis: Personality disorder in adult Diagnosis:   Patient Active Problem List   Diagnosis Date Noted  . Personality disorder in adult [F60.9] 01/24/2016  . Absolute anemia [D64.9] 06/24/2015  . Clinical depression [F32.9] 06/24/2015  . Endometriosis [N80.9] 06/24/2015  . BP (high blood pressure) [I10] 06/24/2015  . Headache, migraine [G43.909] 06/24/2015  . Disease of thyroid gland [E07.9] 06/24/2015  . PMB (postmenopausal bleeding) [N95.0] 05/22/2015  . Bipolar 1 disorder ([place]) [F31.9] 04/23/2015  . Anxiety state [F41.1] 04/23/2015  . Colitis, acute [K52.9] 04/20/2015  . Melena [K92.1] 04/20/2015  . Accelerated hypertension [I10] 04/20/2015  . TIA (transient ischemic attack) [G45.9] 04/20/2015  . Hypothyroidism [E03.9] 04/20/2015  . GERD (gastroesophageal reflux disease) [K21.9] 04/20/2015  . Chest pain [R07.9] 04/20/2015  . Acute colitis [K52.9] 04/20/2015  . Cervical disc disorder with radiculopathy [M50.10] 03/25/2015  . Cervical pain [M54.2] 03/25/2015  . Pelvic pain in female [R10.2] 10/30/2014  . Bipolar affective disorder ([place]) [F31.9] 06/30/2014  . CA of skin [C44.90] 06/30/2014  . Cephalalgia [R51] 12/16/2013  . Neuropathy ([place]) [G62.9] 12/16/2013    Total Time spent with patient: 45 minutes  Subjective:   Angela [name] is a 66 y.o. female patient admitted with "I've had a lot happen this year".  HPI:  Patient interviewed. Chart reviewed. Patient known to me from previous encounters. 66 year old woman who has a history of chronic problems with her mood and behavior. She had her self brought here voluntarily by a friend after leaving her most recent group home  placement.Patient, as usual, tells a long and rambling story but the important most recent point is that she had been placed in some group homes and had been repeatedly doing things that were disturbing to other residents there is such that she was moved from one to another. Apparently she had just been placed in a facility where there were no other residents yesterday. The way she tells the story she felt bullied into agreeing to live there when she didn't feel safe about it and so she instead came to the emergency room. Mood is chronically anxious and reactive.Goal problems as usual including a lot of musculoskeletal pain. Patient is not reporting any active suicidal intent or plan. Not describing any active psychotic symptoms. Indicates that she has been compliant with outpatient mental health treatment.   Social history: No place stable currently to live as she has either worn out her well, more gotten expelled from 4 refused to stay at multiple places recently. Her long-term boyfriend passed away earlier this year.  Medical history: Apparently she recently had some kind of accident and now claims that she has a concussion and multiple injuries to her bones although I'm not sure there is any definite evidence of that. Multiple other chronic medical problems.  Substance abuse history: Not drinking not abusing any drugs.   Psychiatric History: Patient has a long-standing history of mood and behavior problems. At times diagnosed with bipolar disorder although in the time I have seen her I have never seen her really meet full criteria for depression or mania. Instead she is chronically labile and inappropriate in her mood with very poor inappropriate social behavior. Much more like a chronic personality disorder. Medication has been only  partially helpful. She has had hospitalizations in the past but has never actually had a suicide attempt.  Risk to Self: Suicidal Ideation: No Suicidal Intent: No Is  patient at risk for suicide?: No Suicidal Plan?: No Access to Means: No What has been your use of drugs/alcohol within the last 12 months?: Denied How many times?: 1 Other Self Harm Risks: denied Triggers for Past Attempts: None known Intentional Self Injurious Behavior: None Risk to Others: Homicidal Ideation: No Thoughts of Harm to Others: No Current Homicidal Intent: No Current Homicidal Plan: No Access to Homicidal Means: No Identified Victim: None reported History of harm to others?: No Assessment of Violence: None Noted Violent Behavior Description: None reported Does patient have access to weapons?: No Criminal Charges Pending?: No Does patient have a court date: No Prior Inpatient Therapy: Prior Inpatient Therapy: Yes Prior Therapy Dates: "It has been a while" Prior Therapy Facilty/Provider(s): [facility],  Reason for Treatment: Bipolar Disorder Prior Outpatient Therapy: Prior Outpatient Therapy: Yes Prior Therapy Dates: Current Prior Therapy Facilty/Provider(s): [facility] Reason for Treatment: Bipolar Disorder Does patient have an ACCT team?: No Does patient have Intensive In-House Services?  : No Does patient have [facility] services? : No Does patient have P4[facility] services?: No  Past Medical History:  Past Medical History:  Diagnosis Date  . Allergic rhinitis   . Anemia   . Bipolar 1 disorder ([place])   . Blurred vision   . Bronchitis   . Chronic constipation   . Chronic sinusitis   . Depression   . Diverticulitis   . Diverticulosis   . Dizziness   . Endometriosis   . Endometriosis   . Facial pain   . GERD (gastroesophageal reflux disease)   . GERD (gastroesophageal reflux disease)   . Hernia, hiatal   . Hypertension   . Hypothyroidism   . Malignant neoplasm of skin   . Migraine   . Neuropathy ([place])   . Otalgia   . Otitis media   . skin cancer   . Thyroid disease     Past Surgical History:  Procedure Laterality Date  . APPENDECTOMY    .  CHOLECYSTECTOMY    . CHOLECYSTECTOMY, LAPAROSCOPIC    . ESOPHAGOGASTRODUODENOSCOPY (EGD) WITH PROPOFOL N/A 03/29/2015   Procedure: ESOPHAGOGASTRODUODENOSCOPY (EGD) WITH PROPOFOL;  Surgeon: Josefine Class, MD;  Location: [facility] ENDOSCOPY;  Service: Endoscopy;  Laterality: N/A;  . HERNIA REPAIR    . laproscopy    . TONSILLECTOMY    . uteral suspension     Family History:  Family History  Problem Relation Age of Onset  . Heart disease Father   . Cancer Mother     lung  . Urolithiasis Neg Hx   . Kidney disease Neg Hx   . Kidney cancer Neg Hx   . Prostate cancer Neg Hx    Family Psychiatric  History: Nonidentified Social History:  History  Alcohol Use No     History  Drug Use No    Social History   Social History  . Marital status: Single    Spouse name: N/A  . Number of children: N/A  . Years of education: N/A   Social History Main Topics  . Smoking status: Former Research scientist (life sciences)  . Smokeless tobacco: Never Used  . Alcohol use No  . Drug use: No  . Sexual activity: Not Currently   Other Topics Concern  . Not on file   Social History Narrative  . No narrative on file   Additional  Social History:    Allergies:   Allergies  Allergen Reactions  . Bee Venom Hives  . Darvon [Propoxyphene] Anaphylaxis and Hives    Hives/throat swelling Hives/throat swelling Hives/throat swelling Hives/throat swelling  . Dicyclomine     Other reaction(s): Other (See Comments) Pt states if she takes too much she'll start itching all over  . Other Hives    Other reaction(s): Other (See Comments) Other Reaction: Not Assessed Other reaction(s): Unknown Spider bites Spider bites  . Oxycodone-Acetaminophen Other (See Comments)    hallucinations Other reaction(s): Unknown hallucinations hallucinations  . Peanuts [Peanut Oil] Anaphylaxis and Hives    Hives/throat swelling Hives/throat swelling  . Penicillins Hives and Other (See Comments)    Other reaction(s): Unknown Has patient  had a PCN reaction causing immediate rash, facial/tongue/throat swelling, SOB or lightheadedness with hypotension: Unsure  Has patient had a PCN reaction causing severe rash involving mucus membranes or skin necrosis: Unsure  Has patient had a PCN reaction that required hospitalization Unsure  Has patient had a PCN reaction occurring within the last 10 years: Unsure  If all of the above answers are "NO", then may proceed with Cephalosporin use.  Marland Kitchen Percocet [Oxycodone-Acetaminophen] Nausea And Vomiting and Anxiety    Reaction:  Hallucinations   . Sulfa Antibiotics Anaphylaxis, Hives and Itching    Hives/throat swelling  . Sulfacetamide Sodium Anaphylaxis, Hives and Itching    Hives/throat swelling Hives/throat swelling  . Ultram [Tramadol] Hives    Other reaction(s): Other (See Comments) Other Reaction: Allergy  . Amikacin Nausea And Vomiting    Other reaction(s): Unknown  . Aspirin Hives  . Bee Pollen     Other reaction(s): Unknown  . Doxycycline Photosensitivity  . Haloperidol Nausea And Vomiting    Other reaction(s): Unknown  . Hymenoptera Venom Preparations Hives  . Imipramine Hives    Other reaction(s): Unknown  . Sulfur Hives  . Adhesive [Tape] Hives and Rash  . Hydroxyzine Anxiety and Itching  . Prednisone Nausea And Vomiting, Anxiety and Other (See Comments)    Other reaction(s): Unknown Crazy mood swings, strange thoughts Other reaction(s): Other (See Comments)  Crazy mood swings, strange thoughts Reaction:  Strange thoughts     Labs:  Results for orders placed or performed during the hospital encounter of 01/23/16 (from the past 48 hour(s))  Comprehensive metabolic panel     Status: Abnormal   Collection Time: 01/23/16  7:41 PM  Result Value Ref Range   Sodium 133 (L) 135 - 145 mmol/L   Potassium 3.7 3.5 - 5.1 mmol/L   Chloride 97 (L) 101 - 111 mmol/L   CO2 28 22 - 32 mmol/L   Glucose, Bld 104 (H) 65 - 99 mg/dL   BUN 16 6 - 20 mg/dL   Creatinine, Ser 0.94  0.44 - 1.00 mg/dL   Calcium 9.8 8.9 - 10.3 mg/dL   Total Protein 7.8 6.5 - 8.1 g/dL   Albumin 4.4 3.5 - 5.0 g/dL   AST 32 15 - 41 U/L   ALT 23 14 - 54 U/L   Alkaline Phosphatase 102 38 - 126 U/L   Total Bilirubin 0.4 0.3 - 1.2 mg/dL   GFR calc non Af Amer >60 >60 mL/min   GFR calc Af Amer >60 >60 mL/min    Comment: (NOTE) The eGFR has been calculated using the CKD EPI equation. This calculation has not been validated in all clinical situations. eGFR's persistently <60 mL/min signify possible Chronic Kidney Disease.    Anion gap 8  5 - 15  Ethanol     Status: None   Collection Time: 01/23/16  7:41 PM  Result Value Ref Range   Alcohol, Ethyl (B) <5 <5 mg/dL    Comment:        LOWEST DETECTABLE LIMIT FOR SERUM ALCOHOL IS 5 mg/dL FOR MEDICAL PURPOSES ONLY   CBC with Diff     Status: None   Collection Time: 01/23/16  7:41 PM  Result Value Ref Range   WBC 7.0 3.6 - 11.0 K/uL   RBC 4.34 3.80 - 5.20 MIL/uL   Hemoglobin 13.8 12.0 - 16.0 g/dL   HCT 40.1 35.0 - 47.0 %   MCV 92.4 80.0 - 100.0 fL   MCH 31.8 26.0 - 34.0 pg   MCHC 34.5 32.0 - 36.0 g/dL   RDW 14.1 11.5 - 14.5 %   Platelets 370 150 - 440 K/uL   Neutrophils Relative % 60 %   Neutro Abs 4.3 1.4 - 6.5 K/uL   Lymphocytes Relative 24 %   Lymphs Abs 1.7 1.0 - 3.6 K/uL   Monocytes Relative 12 %   Monocytes Absolute 0.8 0.2 - 0.9 K/uL   Eosinophils Relative 3 %   Eosinophils Absolute 0.2 0 - 0.7 K/uL   Basophils Relative 1 %   Basophils Absolute 0.1 0 - 0.1 K/uL  Salicylate level     Status: None   Collection Time: 01/23/16  7:41 PM  Result Value Ref Range   Salicylate Lvl <6.1 2.8 - 30.0 mg/dL  Acetaminophen level     Status: None   Collection Time: 01/23/16  7:41 PM  Result Value Ref Range   Acetaminophen (Tylenol), Serum 10 10 - 30 ug/mL    Comment:        THERAPEUTIC CONCENTRATIONS VARY SIGNIFICANTLY. A RANGE OF 10-30 ug/mL MAY BE AN EFFECTIVE CONCENTRATION FOR MANY PATIENTS. HOWEVER, SOME ARE BEST TREATED AT  CONCENTRATIONS OUTSIDE THIS RANGE. ACETAMINOPHEN CONCENTRATIONS >150 ug/mL AT 4 HOURS AFTER INGESTION AND >50 ug/mL AT 12 HOURS AFTER INGESTION ARE OFTEN ASSOCIATED WITH TOXIC REACTIONS.   Urine Drug Screen, Qualitative ([facility] only)     Status: None   Collection Time: 01/23/16  7:43 PM  Result Value Ref Range   Tricyclic, Ur Screen NONE DETECTED NONE DETECTED   Amphetamines, Ur Screen NONE DETECTED NONE DETECTED   MDMA (Ecstasy)Ur Screen NONE DETECTED NONE DETECTED   Cocaine Metabolite,Ur [place] NONE DETECTED NONE DETECTED   Opiate, Ur Screen NONE DETECTED NONE DETECTED   Phencyclidine (PCP) Ur S NONE DETECTED NONE DETECTED   Cannabinoid 50 Ng, Ur [place] NONE DETECTED NONE DETECTED   Barbiturates, Ur Screen NONE DETECTED NONE DETECTED   Benzodiazepine, Ur Scrn NONE DETECTED NONE DETECTED   Methadone Scn, Ur NONE DETECTED NONE DETECTED    Comment: (NOTE) 607  Tricyclics, urine               Cutoff 1000 ng/mL 200  Amphetamines, urine             Cutoff 1000 ng/mL 300  MDMA (Ecstasy), urine           Cutoff 500 ng/mL 400  Cocaine Metabolite, urine       Cutoff 300 ng/mL 500  Opiate, urine                   Cutoff 300 ng/mL 600  Phencyclidine (PCP), urine      Cutoff 25 ng/mL 700  Cannabinoid, urine  Cutoff 50 ng/mL 800  Barbiturates, urine             Cutoff 200 ng/mL 900  Benzodiazepine, urine           Cutoff 200 ng/mL 1000 Methadone, urine                Cutoff 300 ng/mL 1100 1200 The urine drug screen provides only a preliminary, unconfirmed 1300 analytical test result and should not be used for non-medical 1400 purposes. Clinical consideration and professional judgment should 1500 be applied to any positive drug screen result due to possible 1600 interfering substances. A more specific alternate chemical method 1700 must be used in order to obtain a confirmed analytical result.  1800 Gas chromato graphy / mass spectrometry (GC/MS) is the preferred 1900 confirmatory  method.   Urinalysis, Complete w Microscopic     Status: Abnormal   Collection Time: 01/23/16  7:43 PM  Result Value Ref Range   Color, Urine COLORLESS (A) YELLOW   APPearance CLEAR (A) CLEAR   Specific Gravity, Urine 1.004 (L) 1.005 - 1.030   pH 7.0 5.0 - 8.0   Glucose, UA NEGATIVE NEGATIVE mg/dL   Hgb urine dipstick NEGATIVE NEGATIVE   Bilirubin Urine NEGATIVE NEGATIVE   Ketones, ur NEGATIVE NEGATIVE mg/dL   Protein, ur NEGATIVE NEGATIVE mg/dL   Nitrite NEGATIVE NEGATIVE   Leukocytes, UA TRACE (A) NEGATIVE   RBC / HPF 0-5 0 - 5 RBC/hpf   WBC, UA 0-5 0 - 5 WBC/hpf   Bacteria, UA NONE SEEN NONE SEEN   Squamous Epithelial / LPF NONE SEEN NONE SEEN   Mucous PRESENT     Current Facility-Administered Medications  Medication Dose Route Frequency Provider Last Rate Last Dose  . amLODipine (NORVASC) tablet 2.5 mg  2.5 mg Oral Daily Schuyler Amor, MD   2.5 mg at 01/24/16 0947  . divalproex (DEPAKOTE ER) 24 hr tablet 250 mg  250 mg Oral Daily Schuyler Amor, MD      . divalproex (DEPAKOTE ER) 500 MG 24 hr tablet           . [START ON 01/25/2016] levothyroxine (SYNTHROID, LEVOTHROID) tablet 88 mcg  88 mcg Oral QAC breakfast Schuyler Amor, MD   88 mcg at 01/24/16 0946  . pantoprazole (PROTONIX) EC tablet 40 mg  40 mg Oral BID AC Schuyler Amor, MD   40 mg at 01/24/16 1034  . PARoxetine (PAXIL) tablet 40 mg  40 mg Oral Daily Schuyler Amor, MD   40 mg at 01/24/16 0946  . tiZANidine (ZANAFLEX) tablet 4 mg  4 mg Oral Once Earleen Newport, MD      . tiZANidine (ZANAFLEX) tablet 4 mg  4 mg Oral Q6H PRN Schuyler Amor, MD      . traZODone (DESYREL) tablet 50 mg  50 mg Oral Once Earleen Newport, MD       Current Outpatient Prescriptions  Medication Sig Dispense Refill  . acetaminophen (RA ACETAMINOPHEN) 650 MG CR tablet Take by mouth.    Marland Kitchen amLODipine (NORVASC) 2.5 MG tablet Take 2.5 mg by mouth daily.    . Biotin 5000 MCG CAPS Take 1 capsule by mouth daily.     . calcium carbonate  (OS-CAL) 600 MG TABS tablet Take 600 mg by mouth 2 (two) times daily with a meal.     . cetirizine (ZYRTEC) 10 MG tablet Take 10 mg by mouth daily.    . Cholecalciferol (VITAMIN D-1000 MAX  ST) 1000 units tablet Take by mouth.    . divalproex (DEPAKOTE ER) 250 MG 24 hr tablet     . HYDROcodone-acetaminophen (NORCO/VICODIN) 5-325 MG tablet Take 1-2 tablets by mouth every 6 (six) hours as needed for moderate pain. Reported on 06/24/2015 40 tablet 0  . Iron-FA-B Cmp-C-Biot-Probiotic (FUSION PLUS) CAPS Take 130 mg by mouth daily. 30 capsule 6  . LACTOBACILLUS EXTRA STRENGTH CAPS Take by mouth.    . levothyroxine (SYNTHROID, LEVOTHROID) 88 MCG tablet Take 88 mcg by mouth daily before breakfast. Reported on 06/24/2015    . Linaclotide (LINZESS) 290 MCG CAPS capsule Take 290 mcg by mouth daily.    . multivitamin-iron-minerals-folic acid (THERAPEUTIC-M) TABS tablet Take 1 tablet by mouth daily.    . pantoprazole (PROTONIX) 40 MG tablet Take 1 tablet (40 mg total) by mouth 2 (two) times daily before a meal. 60 tablet 0  . ranitidine ([facility] RANITIDINE) 75 MG tablet Take by mouth.    Marland Kitchen tiZANidine (ZANAFLEX) 4 MG tablet Take 4 mg by mouth every 6 (six) hours as needed for muscle spasms.    . traZODone (DESYREL) 50 MG tablet Take 1/2 pill at night. May increase to a full pill    . EPINEPHrine (EPIPEN 2-PAK) 0.3 mg/0.3 mL IJ SOAJ injection Inject 0.3 mg into the skin.    Marland Kitchen PARoxetine (PAXIL) 30 MG tablet Take 40 mg by mouth daily.     Marland Kitchen Spirulina 500 MG TABS Take by mouth.    . terbinafine (LAMISIL) 1 % cream Apply 1 application topically 2 (two) times daily.    Marland Kitchen triamcinolone cream (KENALOG) 0.1 % Apply 1 application topically 2 (two) times daily.    Marland Kitchen White Petrolatum-Mineral Oil (GENTEAL TEARS NIGHT-TIME) OINT Apply to eye.    . zolpidem (AMBIEN) 10 MG tablet Take 10 mg by mouth at bedtime as needed for sleep.      Musculoskeletal: Strength & Muscle Tone: within normal limits Gait & Station: normal Patient  leans: N/A  Psychiatric Specialty Exam: Physical Exam  Nursing note and vitals reviewed. Constitutional: She appears well-developed and well-nourished.  HENT:  Head: Normocephalic and atraumatic.  Eyes: Conjunctivae are normal. Pupils are equal, round, and reactive to light.  Neck: Normal range of motion.  Cardiovascular: Normal heart sounds.   Respiratory: Effort normal.  GI: Soft.  Musculoskeletal: Normal range of motion.       Arms: Neurological: She is alert.  Skin: Skin is warm and dry.     Psychiatric: Her behavior is normal. Her mood appears anxious. Her affect is labile. Her speech is tangential. She expresses inappropriate judgment. She expresses no homicidal and no suicidal ideation. She exhibits abnormal recent memory and abnormal remote memory.    Review of Systems  Constitutional: Negative.   HENT: Negative.   Eyes: Negative.   Respiratory: Negative.   Cardiovascular: Negative.   Gastrointestinal: Negative.   Musculoskeletal: Negative.   Skin: Negative.   Neurological: Negative.   Psychiatric/Behavioral: Negative for depression, hallucinations, substance abuse and suicidal ideas. The patient is nervous/anxious.     Blood pressure (!) 102/54, pulse 60, temperature 97.3 F (36.3 C), temperature source Axillary, resp. rate 14, height 5' 3.5" (1.613 m), weight 57.2 kg (126 lb), SpO2 96 %.Body mass index is 21.97 kg/m.  General Appearance: Disheveled  Eye Contact:  Good  Speech:  Clear and Coherent  Volume:  Increased  Mood:  Anxious  Affect:  Full Range  Thought Process:  Disorganized and Irrelevant  Orientation:  Full (  Time, Place, and Person)  Thought Content:  Tangential  Suicidal Thoughts:  No  Homicidal Thoughts:  No  Memory:  Immediate;   Fair Recent;   Fair Remote;   Fair  Judgement:  Impaired  Insight:  Shallow  Psychomotor Activity:  Normal  Concentration:  Concentration: Fair  Recall:  [name]:  Fair  Language:  Fair    Akathisia:  No  Handed:  Right  AIMS (if indicated):     Assets:  Desire for Improvement Resilience  ADL's:  Intact  Cognition:  WNL  Sleep:        Treatment Plan Summary: Medication management and Plan 66 year old woman with chronic mood and anxiety problems and personality disorder. Patient is currently stable without any acute dangerousness. Not psychotic. Has no indication for inpatient hospital level treatment. Patient can be discharged and will follow-up with her regular psychiatrist in [place]. No further need for emergency room level care. No need for IVC. Supportive counseling completed.  Disposition: Patient does not meet criteria for psychiatric inpatient admission.  Alethia Berthold, MD 01/24/2016 2:55 PM

## 2016-01-24 NOTE — ED Notes (Signed)
Awake and alert.  Patient concerned about multiple areas of joint pain (right shoulder, right elbow, knees) and concerned about being evaluated for joint pain.  Zanaflex offered to patient for discomfort, patient refused.  Dr. Quentin Cornwall alerted to patient's anxiety and obsession about medications, geodon ordered.  Patient refused geodon.  Dr. Quentin Cornwall alerted.

## 2016-01-24 NOTE — ED Notes (Signed)
Pt states she will now take the trazodone and zanaflex, but only 25mg  of trazodone. Pt encouraged to close eyes and rest. Pt has slept approx 45 minutes.

## 2016-01-24 NOTE — ED Provider Notes (Signed)
-----------------------------------------   7:00 AM on 01/24/2016 -----------------------------------------   Blood pressure (!) 102/54, pulse 60, temperature 97.3 F (36.3 C), temperature source Axillary, resp. rate 14, height 5' 3.5" (1.613 m), weight 126 lb (57.2 kg), SpO2 96 %.  The patient had no acute events since last update.  Calm and cooperative at this time.  Disposition is pending Psychiatry/Behavioral Medicine team recommendations.     Paulette Blanch, MD 01/24/16 [phone]

## 2016-01-25 DIAGNOSIS — F3164 Bipolar disorder, current episode mixed, severe, with psychotic features: Secondary | ICD-10-CM | POA: Diagnosis not present

## 2016-01-25 MED ORDER — TRAZODONE HCL 50 MG PO TABS: 50.0000 mg | ORAL_TABLET | Freq: Every day | ORAL | 1 refills | Status: DC

## 2016-01-25 MED ORDER — PAROXETINE HCL 20 MG PO TABS: 20.0000 mg | ORAL_TABLET | Freq: Every day | ORAL | Status: DC

## 2016-01-25 MED ORDER — DIVALPROEX SODIUM 250 MG PO DR TAB: 250.0000 mg | DELAYED_RELEASE_TABLET | Freq: Every day | ORAL | Status: DC

## 2016-01-25 MED ORDER — TRAZODONE HCL 50 MG PO TABS: 50.0000 mg | ORAL_TABLET | Freq: Every day | ORAL | Status: DC

## 2016-01-25 MED ORDER — PAROXETINE HCL 20 MG PO TABS: 20.0000 mg | ORAL_TABLET | Freq: Every day | ORAL | 1 refills | Status: DC

## 2016-01-25 MED ORDER — LORAZEPAM 0.5 MG PO TABS: 0.5000 mg | ORAL_TABLET | Freq: Three times a day (TID) | ORAL | 0 refills | Status: DC

## 2016-01-25 MED ORDER — ZOLPIDEM TARTRATE 5 MG PO TABS
10.0000 mg | ORAL_TABLET | Freq: Every evening | ORAL | Status: DC | PRN
  Administered 2016-01-25: 10 mg via ORAL
  Filled 2016-01-25: qty 2

## 2016-01-25 MED ORDER — DIVALPROEX SODIUM 250 MG PO DR TAB: 250.0000 mg | DELAYED_RELEASE_TABLET | Freq: Every day | ORAL | 1 refills | Status: DC

## 2016-01-25 NOTE — Consult Note (Signed)
  Psychiatry: Follow-up note for this patient in the emergency room. Patient was seen yesterday and was taken off of commitment. She does not require inpatient hospital treatment. She is still here in the emergency room today. Case reviewed with the patient with social work with TTS and nursing. Patient can be discharged. If she wants to go and find her own place to live that is satisfactory. She is cognitively intact enough and free enough of severe mental illness to be able to take care of herself outside of the hospital. Patient requested that I make a few changes to her medicine to more closely mirror what she had been prescribed recently and I have done this.

## 2016-01-25 NOTE — ED Notes (Signed)
Pt in doorway demanding that this tech go get her meds and continuously stating " I have to take my meds on an empty stomach" this tech reassures pt that RN handles all of that and she will be in shortly to speak with her, Rn (Amy) notified

## 2016-01-25 NOTE — ED Notes (Signed)
BEHAVIORAL HEALTH ROUNDING Patient sleeping: No. Patient alert and oriented: yes Behavior appropriate: Yes.  ; If no, describe:  Nutrition and fluids offered: yes Toileting and hygiene offered: Yes  Sitter present: q15 minute observations and security  monitoring Law enforcement present: Yes  ODS  

## 2016-01-25 NOTE — Clinical Social Work Note (Signed)
Clinical Social Work Assessment  Patient Details  Name: NDEYE DABROWSKI MRN: [id number] Date of Birth: [birth year]  Date of referral:  01/25/16               Reason for consult:  Facility Placement                Permission sought to share information with:  Facility Sport and exercise psychologist, Family Supports Permission granted to share information::  Yes, Verbal Permission Granted  Name::     Lysbeth Galas (sister) [phone]  Agency::     Relationship::     Contact Information:     Housing/Transportation Living arrangements for the past 2 months:  Group Home, Homeless Source of Information:  Patient Patient Interpreter Needed:  None Criminal Activity/Legal Involvement Pertinent to Current Situation/Hospitalization:  No - Comment as needed Significant Relationships:  Adult Children, Friend Lives with:  Facility Resident Do you feel safe going back to the place where you live?  Yes Need for family participation in patient care:  Yes (Comment)  Care giving concerns: Pt is currently unable to care for herself independently.    Social Worker assessment / plan: CSW received consult for facility placement. Pt is from a group home in the area and has reportedly been to several facilities in the last few days. Pt became homeless after walking off from her most recent group home (or getting dismissed by her group home). CSW engaged with pt at pt's bedside. SW student and another CSW were both present at the time of the assessment. Pt states "I did not come from a group home. Anyone who tells you that I did is lying." Pt was guarded and refused to tell CSW anything about where she came from prior to being homeless. Pt has made it very clear that she will not return to a group home ALF, or SNF. Pt would like to go to a motel, apartment, or small house. CSW explained that social work cannot secure housing for her but would be happy to provide resources. Pt was agreeable to this. Pt also has a friend that  she has been in contact with and has given this Probation officer permission to contact. CSW contacted Rickey Primus at [phone]. Jana Half states that pt was dismissed from her prior group home and has burnt several bridges with the people in her life. Jana Half also states that she would be unwilling to assume care for the pt. CSW will staff case with psych and decide how to move forward appropriately with the pt.  Employment status:  Retired Forensic scientist:  Information systems manager, Medicaid In [place] PT Recommendations:  Not assessed at this time [place] / Referral to community resources:  Other (Comment Required) (Group home, ALF, SNF)  Patient/Family's Response to care: If pt continues to decline help with placement, pt will d/c to care of family or friend.   Patient/Family's Understanding of and Emotional Response to Diagnosis, Current Treatment, and Prognosis: Pt is appreciative of the care provided by CSW at this time.  Emotional Assessment Appearance:  Appears stated age Attitude/Demeanor/Rapport:  Guarded Affect (typically observed):  Agitated, Other (Labile) Orientation:  Oriented to Self, Oriented to Place Alcohol / Substance use:  Not Applicable Psych involvement (Current and /or in the community):  Outpatient Provider  Discharge Needs  Concerns to be addressed:  Care Coordination, Discharge Planning Concerns Readmission within the last 30 days:  Yes Current discharge risk:  Cognitively Impaired, Homeless Barriers to Discharge:  Other (Pt is not agreeable to returning to  her facility )   Georga Kaufmann, [place] 01/25/2016, 1:49 PM

## 2016-01-25 NOTE — ED Notes (Signed)
Rn at pt bedside at this time with meds

## 2016-01-25 NOTE — ED Provider Notes (Signed)
-----------------------------------------   1:58 PM on 01/25/2016 -----------------------------------------   Blood pressure (!) 146/90, pulse 69, temperature 97.7 F (36.5 C), temperature source Oral, resp. rate 19, height 5' 3.5" (1.613 m), weight 126 lb (57.2 kg), SpO2 100 %.  The patient had no acute events since last update.  Calm and cooperative at this time.  Disposition is pending Psychiatry/Behavioral Medicine team recommendations.     Carrie Mew, MD 01/25/16 [phone]

## 2016-01-25 NOTE — ED Notes (Signed)
Pt standing in doorway wanting to know why she hasn't gotten her meds, this tech explained to pt that the Rn would bring them around breakfast time which just arrived

## 2016-01-25 NOTE — ED Notes (Signed)
Pt observed with no unusual behavior  Appropriate to stimulation  No verbalized needs or concerns at this time  NAD assessed  Continue to monitor 

## 2016-01-25 NOTE — ED Notes (Signed)
Pt given water by this tech.  

## 2016-01-25 NOTE — ED Notes (Signed)
Per Dr. Weber Cooks, pt has been cleared for d/c. CSW provided pt with resources to local boarding houses, shelters, and free meals in the area. Pt states that she will get in contact with her friends upon d/c and they will be able to pick her up from the ED lobby tonight. CSW has alos provided pt with a bus pass just in case pt's friends are unavailable to pick her up. EDP and RN have been notified. No further social work needs at this time. However, CSW is available should another need arise.  Angela [name], MSW, [place]

## 2016-01-25 NOTE — ED Notes (Signed)
Pt given new behavioral clothing (burgandy scrubs, sock, underwear) bathing materials (shampoo,soap, 2 towels, 2 wash cloths), pt dirty linen removed from pt bed and wiped down and pt provided with clean linen by this tech

## 2016-01-25 NOTE — ED Notes (Signed)
ED  Is the patient under IVC or is there intent for IVC: no Is the patient medically cleared: Yes.   Is there vacancy in the ED BHU: Yes.   Is the population mix appropriate for patient: Yes.   Is the patient awaiting placement in inpatient or outpatient setting: Yes.   Has the patient had a psychiatric consult: Yes.   Survey of unit performed for contraband, proper placement and condition of furniture, tampering with fixtures in bathroom, shower, and each patient room: Yes.  ; Findings:  APPEARANCE/BEHAVIOR Calm and cooperative NEURO ASSESSMENT Orientation: oriented x3  Denies pain Hallucinations: No.None noted (Hallucinations) Speech: Normal Gait: normal RESPIRATORY ASSESSMENT Even  Unlabored respirations  CARDIOVASCULAR ASSESSMENT Pulses equal   regular rate  Skin warm and dry   GASTROINTESTINAL ASSESSMENT no GI complaint EXTREMITIES Full ROM  PLAN OF CARE Provide calm/safe environment. Vital signs assessed twice daily. ED BHU Assessment once each 12-hour shift. Collaborate with TTS daily or as condition indicates. Assure the ED provider has rounded once each shift. Provide and encourage hygiene. Provide redirection as needed. Assess for escalating behavior; address immediately and inform ED provider.  Assess family dynamic and appropriateness for visitation as needed: Yes.  ; If necessary, describe findings:  Educate the patient/family about BHU procedures/visitation: Yes.  ; If necessary, describe findings:

## 2016-01-25 NOTE — ED Notes (Signed)

## 2016-01-25 NOTE — ED Notes (Signed)
Pt observed getting hand sanitizer in her hand and rubbing it in her hair, when asked what pt was doing she states "cleaning my head the only way that I can, the fleas itch so bad"

## 2016-01-25 NOTE — Discharge Instructions (Signed)
Return to the emergency department if you develop any thoughts of hurting yourself or anyone else, hallucinations, or any other symptoms concerning to you.

## 2016-01-25 NOTE — ED Notes (Signed)
TTS to pt room to speak with her and pt ask them "wait until I finish brushing my teeth"

## 2016-01-26 ENCOUNTER — Ambulatory Visit: Payer: Medicare Other | Attending: Obstetrics and Gynecology | Admitting: Physical Therapy

## 2016-01-27 ENCOUNTER — Encounter: Payer: Medicare Other | Admitting: Physical Therapy

## 2016-01-28 ENCOUNTER — Emergency Department
Admission: EM | Admit: 2016-01-28 | Discharge: 2016-01-29 | Disposition: A | Discharge status: Home / Self Care | Payer: Medicare Other | Attending: Emergency Medicine | Admitting: Emergency Medicine

## 2016-01-28 ENCOUNTER — Encounter: Payer: Self-pay | Admitting: Emergency Medicine

## 2016-01-28 DIAGNOSIS — Z87891 Personal history of nicotine dependence: Secondary | ICD-10-CM | POA: Diagnosis not present

## 2016-01-28 DIAGNOSIS — Z91199 Patient's noncompliance with other medical treatment and regimen due to unspecified reason: Secondary | ICD-10-CM

## 2016-01-28 DIAGNOSIS — I10 Essential (primary) hypertension: Secondary | ICD-10-CM | POA: Insufficient documentation

## 2016-01-28 DIAGNOSIS — Z9119 Patient's noncompliance with other medical treatment and regimen: Secondary | ICD-10-CM | POA: Diagnosis not present

## 2016-01-28 DIAGNOSIS — Z79899 Other long term (current) drug therapy: Secondary | ICD-10-CM | POA: Insufficient documentation

## 2016-01-28 DIAGNOSIS — Z91010 Allergy to peanuts: Secondary | ICD-10-CM | POA: Diagnosis not present

## 2016-01-28 DIAGNOSIS — E039 Hypothyroidism, unspecified: Secondary | ICD-10-CM | POA: Diagnosis not present

## 2016-01-28 DIAGNOSIS — E871 Hypo-osmolality and hyponatremia: Secondary | ICD-10-CM | POA: Diagnosis not present

## 2016-01-28 DIAGNOSIS — Z85828 Personal history of other malignant neoplasm of skin: Secondary | ICD-10-CM | POA: Insufficient documentation

## 2016-01-28 DIAGNOSIS — Z046 Encounter for general psychiatric examination, requested by authority: Secondary | ICD-10-CM | POA: Diagnosis present

## 2016-01-28 LAB — URINE DRUG SCREEN, QUALITATIVE (ARMC ONLY)
AMPHETAMINES, UR SCREEN: NOT DETECTED
BENZODIAZEPINE, UR SCRN: NOT DETECTED
Barbiturates, Ur Screen: NOT DETECTED
COCAINE METABOLITE, UR ~~LOC~~: NOT DETECTED
Cannabinoid 50 Ng, Ur ~~LOC~~: NOT DETECTED
MDMA (ECSTASY) UR SCREEN: NOT DETECTED
Methadone Scn, Ur: NOT DETECTED
OPIATE, UR SCREEN: NOT DETECTED
PHENCYCLIDINE (PCP) UR S: NOT DETECTED
Tricyclic, Ur Screen: NOT DETECTED

## 2016-01-28 LAB — COMPREHENSIVE METABOLIC PANEL
ALBUMIN: 4.5 g/dL (ref 3.5–5.0)
ALK PHOS: 93 U/L (ref 38–126)
ALT: 17 U/L (ref 14–54)
AST: 30 U/L (ref 15–41)
Anion gap: 11 (ref 5–15)
BILIRUBIN TOTAL: 0.6 mg/dL (ref 0.3–1.2)
BUN: 13 mg/dL (ref 6–20)
CO2: 21 mmol/L — ABNORMAL LOW (ref 22–32)
Calcium: 9.3 mg/dL (ref 8.9–10.3)
Chloride: 97 mmol/L — ABNORMAL LOW (ref 101–111)
Creatinine, Ser: 0.81 mg/dL (ref 0.44–1.00)
GFR calc Af Amer: >60 mL/min (ref >60)
GFR calc non Af Amer: >60 mL/min (ref >60)
GLUCOSE: 88 mg/dL (ref 65–99)
POTASSIUM: 3.7 mmol/L (ref 3.5–5.1)
Sodium: 129 mmol/L — ABNORMAL LOW (ref 135–145)
TOTAL PROTEIN: 7.6 g/dL (ref 6.5–8.1)

## 2016-01-28 LAB — CBC
HEMATOCRIT: 38.8 % (ref 35.0–47.0)
HEMOGLOBIN: 13.5 g/dL (ref 12.0–16.0)
MCH: 31.8 pg (ref 26.0–34.0)
MCHC: 34.7 g/dL (ref 32.0–36.0)
MCV: 91.4 fL (ref 80.0–100.0)
Platelets: 363 10*3/uL (ref 150–440)
RBC: 4.24 MIL/uL (ref 3.80–5.20)
RDW: 13.8 % (ref 11.5–14.5)
WBC: 5.2 10*3/uL (ref 3.6–11.0)

## 2016-01-28 LAB — ETHANOL: Alcohol, Ethyl (B): <5 mg/dL (ref <5)

## 2016-01-28 LAB — ACETAMINOPHEN LEVEL: Acetaminophen (Tylenol), Serum: 11 ug/mL (ref 10–30)

## 2016-01-28 LAB — SALICYLATE LEVEL: Salicylate Lvl: <7 mg/dL (ref 2.8–30.0)

## 2016-01-28 MED ORDER — LEVOTHYROXINE SODIUM 88 MCG PO TABS
88.0000 ug | ORAL_TABLET | Freq: Every day | ORAL | Status: DC
  Administered 2016-01-29: 88 ug via ORAL
  Filled 2016-01-28: qty 1

## 2016-01-28 MED ORDER — LINACLOTIDE 290 MCG PO CAPS
290.0000 ug | ORAL_CAPSULE | Freq: Every day | ORAL | Status: DC
  Administered 2016-01-29: 290 ug via ORAL
  Filled 2016-01-28: qty 1

## 2016-01-28 MED ORDER — TRAZODONE HCL 50 MG PO TABS
50.0000 mg | ORAL_TABLET | Freq: Every day | ORAL | Status: DC
  Administered 2016-01-28: 50 mg via ORAL
  Filled 2016-01-28: qty 1

## 2016-01-28 MED ORDER — PAROXETINE HCL 20 MG PO TABS: 20.0000 mg | ORAL_TABLET | Freq: Every day | ORAL | Status: DC

## 2016-01-28 MED ORDER — ZOLPIDEM TARTRATE 5 MG PO TABS: 10.0000 mg | ORAL_TABLET | Freq: Every evening | ORAL | Status: DC | PRN

## 2016-01-28 MED ORDER — PANTOPRAZOLE SODIUM 40 MG PO TBEC: 40.0000 mg | DELAYED_RELEASE_TABLET | Freq: Two times a day (BID) | ORAL | Status: DC

## 2016-01-28 MED ORDER — ACETAMINOPHEN 325 MG PO TABS
ORAL_TABLET | ORAL | Status: AC
  Administered 2016-01-28: 650 mg via ORAL
  Filled 2016-01-28: qty 2

## 2016-01-28 MED ORDER — BIOTIN 5000 MCG PO CAPS: 1.0000 | ORAL_CAPSULE | Freq: Every day | ORAL | Status: DC

## 2016-01-28 MED ORDER — DIVALPROEX SODIUM 250 MG PO DR TAB
250.0000 mg | DELAYED_RELEASE_TABLET | Freq: Every day | ORAL | Status: DC
  Administered 2016-01-28: 250 mg via ORAL
  Filled 2016-01-28: qty 1

## 2016-01-28 MED ORDER — PAROXETINE HCL 20 MG PO TABS
40.0000 mg | ORAL_TABLET | Freq: Every day | ORAL | Status: DC
  Administered 2016-01-28 – 2016-01-29 (×2): 40 mg via ORAL
  Filled 2016-01-28 (×2): qty 2

## 2016-01-28 MED ORDER — AMLODIPINE BESYLATE 5 MG PO TABS
2.5000 mg | ORAL_TABLET | Freq: Every day | ORAL | Status: DC
  Administered 2016-01-28 – 2016-01-29 (×2): 2.5 mg via ORAL
  Filled 2016-01-28 (×2): qty 1

## 2016-01-28 MED ORDER — CALCIUM CARBONATE ANTACID 500 MG PO CHEW
500.0000 mg | CHEWABLE_TABLET | Freq: Two times a day (BID) | ORAL | Status: DC
  Administered 2016-01-29: 500 mg via ORAL
  Filled 2016-01-28: qty 1

## 2016-01-28 MED ORDER — TIZANIDINE HCL 4 MG PO TABS
4.0000 mg | ORAL_TABLET | Freq: Four times a day (QID) | ORAL | Status: DC | PRN
  Administered 2016-01-28: 4 mg via ORAL
  Filled 2016-01-28 (×2): qty 1

## 2016-01-28 MED ORDER — ACETAMINOPHEN 325 MG PO TABS
650.0000 mg | ORAL_TABLET | Freq: Once | ORAL | Status: AC
  Administered 2016-01-28: 650 mg via ORAL

## 2016-01-28 MED ORDER — FAMOTIDINE 20 MG PO TABS
20.0000 mg | ORAL_TABLET | Freq: Every day | ORAL | Status: DC
  Administered 2016-01-28 – 2016-01-29 (×2): 20 mg via ORAL
  Filled 2016-01-28 (×2): qty 1

## 2016-01-28 NOTE — ED Notes (Signed)
PT IVC PENDING PSYCH CONSULT. 

## 2016-01-28 NOTE — ED Notes (Signed)
Pt IVC brought in by [employer] who explain pt's guardian is DSS, DSS has found a group home and pt is refusing to go, pt denies SI/HI

## 2016-01-28 NOTE — ED Notes (Signed)
Gave patient a cup of water with little to no ice per her request.
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
## 2016-01-28 NOTE — Progress Notes (Signed)
PHARMACIST - PHYSICIAN ORDER COMMUNICATION  CONCERNING: P&T Medication Policy on Herbal Medications  DESCRIPTION:  This patient's order for:  Biotin  has been noted.  This product(s) is classified as an "herbal" or natural product. Due to a lack of definitive safety studies or FDA approval, nonstandard manufacturing practices, plus the potential risk of unknown drug-drug interactions while on inpatient medications, the Pharmacy and Therapeutics Committee does not permit the use of "herbal" or natural products of this type within [facility].   ACTION TAKEN: The pharmacy department is unable to verify this order at this time and your patient has been informed of this safety policy. Please reevaluate patient's clinical condition at discharge and address if the herbal or natural product(s) should be resumed at that time.  Paulina Fusi, PharmD, BCPS 01/28/2016 9:43 PM

## 2016-01-28 NOTE — ED Provider Notes (Signed)
[facility] Emergency Department Provider Note  ____________________________________________   First MD Initiated Contact with Patient 01/28/16 1631     (approximate)  I have reviewed the triage vital signs and the nursing notes.   HISTORY  Chief Complaint Psychiatric Evaluation   HPI Angela [name] is a 66 y.o. female with a history of bipolar disorder who is presenting under commitment from the Department of Social Services for medication noncompliance refusing to go to group home. The patient says that she has been staying at a hotel and denies any suicidal or homicidal ideation. She is also saying that she will not go to a group home. She says that she would like to talk to social work for further housing options. She denies any toxic ingestions. Denies any drug abuse or alcoholism.   Past Medical History:  Diagnosis Date  . Allergic rhinitis   . Anemia   . Bipolar 1 disorder ([place])   . Blurred vision   . Bronchitis   . Chronic constipation   . Chronic sinusitis   . Depression   . Diverticulitis   . Diverticulosis   . Dizziness   . Endometriosis   . Endometriosis   . Facial pain   . GERD (gastroesophageal reflux disease)   . GERD (gastroesophageal reflux disease)   . Hernia, hiatal   . Hypertension   . Hypothyroidism   . Malignant neoplasm of skin   . Migraine   . Neuropathy ([place])   . Otalgia   . Otitis media   . skin cancer   . Thyroid disease     Patient Active Problem List   Diagnosis Date Noted  . Personality disorder in adult 01/24/2016  . Absolute anemia 06/24/2015  . Clinical depression 06/24/2015  . Endometriosis 06/24/2015  . BP (high blood pressure) 06/24/2015  . Headache, migraine 06/24/2015  . Disease of thyroid gland 06/24/2015  . PMB (postmenopausal bleeding) 05/22/2015  . Bipolar 1 disorder ([place]) 04/23/2015  . Anxiety state 04/23/2015  . Colitis, acute 04/20/2015  . Melena 04/20/2015  . Accelerated  hypertension 04/20/2015  . TIA (transient ischemic attack) 04/20/2015  . Hypothyroidism 04/20/2015  . GERD (gastroesophageal reflux disease) 04/20/2015  . Chest pain 04/20/2015  . Acute colitis 04/20/2015  . Cervical disc disorder with radiculopathy 03/25/2015  . Cervical pain 03/25/2015  . Pelvic pain in female 10/30/2014  . Bipolar affective disorder ([place]) 06/30/2014  . CA of skin 06/30/2014  . Cephalalgia 12/16/2013  . Neuropathy ([place]) 12/16/2013    Past Surgical History:  Procedure Laterality Date  . APPENDECTOMY    . CHOLECYSTECTOMY    . CHOLECYSTECTOMY, LAPAROSCOPIC    . ESOPHAGOGASTRODUODENOSCOPY (EGD) WITH PROPOFOL N/A 03/29/2015   Procedure: ESOPHAGOGASTRODUODENOSCOPY (EGD) WITH PROPOFOL;  Surgeon: Josefine Class, MD;  Location: [facility] ENDOSCOPY;  Service: Endoscopy;  Laterality: N/A;  . HERNIA REPAIR    . laproscopy    . TONSILLECTOMY    . uteral suspension      Prior to Admission medications   Medication Sig Start Date End Date Taking? Authorizing Provider  acetaminophen (RA ACETAMINOPHEN) 650 MG CR tablet Take by mouth. 02/03/15   Historical Provider, MD  amLODipine (NORVASC) 2.5 MG tablet Take 2.5 mg by mouth daily.    Historical Provider, MD  Biotin 5000 MCG CAPS Take 1 capsule by mouth daily.     Historical Provider, MD  calcium carbonate (OS-CAL) 600 MG TABS tablet Take 600 mg by mouth 2 (two) times daily with a meal.  Historical Provider, MD  cetirizine (ZYRTEC) 10 MG tablet Take 10 mg by mouth daily.    Historical Provider, MD  Cholecalciferol (VITAMIN D-1000 MAX ST) 1000 units tablet Take by mouth. 02/03/15   Historical Provider, MD  divalproex (DEPAKOTE ER) 250 MG 24 hr tablet  09/22/15   Historical Provider, MD  divalproex (DEPAKOTE) 250 MG DR tablet Take 1 tablet (250 mg total) by mouth at bedtime. 01/25/16   Gonzella Lex, MD  EPINEPHrine (EPIPEN 2-PAK) 0.3 mg/0.3 mL IJ SOAJ injection Inject 0.3 mg into the skin.    Historical Provider, MD    HYDROcodone-acetaminophen (NORCO/VICODIN) 5-325 MG tablet Take 1-2 tablets by mouth every 6 (six) hours as needed for moderate pain. Reported on 06/24/2015 10/07/15   Rubie Maid, MD  Iron-FA-B Cmp-C-Biot-Probiotic (FUSION PLUS) CAPS Take 130 mg by mouth daily. 06/17/15   Rubie Maid, MD  LACTOBACILLUS EXTRA STRENGTH CAPS Take by mouth.    Historical Provider, MD  levothyroxine (SYNTHROID, LEVOTHROID) 88 MCG tablet Take 88 mcg by mouth daily before breakfast. Reported on 06/24/2015    Historical Provider, MD  Linaclotide (LINZESS) 290 MCG CAPS capsule Take 290 mcg by mouth daily.    Historical Provider, MD  LORazepam (ATIVAN) 0.5 MG tablet Take 1 tablet (0.5 mg total) by mouth every 8 (eight) hours. 01/25/16   Gonzella Lex, MD  multivitamin-iron-minerals-folic acid (THERAPEUTIC-M) TABS tablet Take 1 tablet by mouth daily.    Historical Provider, MD  pantoprazole (PROTONIX) 40 MG tablet Take 1 tablet (40 mg total) by mouth 2 (two) times daily before a meal. 04/26/15   Demetrios Loll, MD  PARoxetine (PAXIL) 20 MG tablet Take 1 tablet (20 mg total) by mouth daily. 01/26/16   Gonzella Lex, MD  PARoxetine (PAXIL) 30 MG tablet Take 40 mg by mouth daily.     Historical Provider, MD  ranitidine (CVS RANITIDINE) 75 MG tablet Take by mouth.    Historical Provider, MD  Spirulina 500 MG TABS Take by mouth.    Historical Provider, MD  terbinafine (LAMISIL) 1 % cream Apply 1 application topically 2 (two) times daily.    Historical Provider, MD  tiZANidine (ZANAFLEX) 4 MG tablet Take 4 mg by mouth every 6 (six) hours as needed for muscle spasms.    Historical Provider, MD  traZODone (DESYREL) 50 MG tablet Take 1/2 pill at night. May increase to a full pill 08/27/15   Historical Provider, MD  traZODone (DESYREL) 50 MG tablet Take 1 tablet (50 mg total) by mouth at bedtime. 01/25/16   Gonzella Lex, MD  triamcinolone cream (KENALOG) 0.1 % Apply 1 application topically 2 (two) times daily.    Historical Provider, MD  White  Petrolatum-Mineral Oil (GENTEAL TEARS NIGHT-TIME) OINT Apply to eye.    Historical Provider, MD  zolpidem (AMBIEN) 10 MG tablet Take 10 mg by mouth at bedtime as needed for sleep.    Historical Provider, MD    Allergies Bee venom; Darvon [propoxyphene]; Dicyclomine; Other; Oxycodone-acetaminophen; Peanuts [peanut oil]; Penicillins; Percocet [oxycodone-acetaminophen]; Sulfa antibiotics; Sulfacetamide sodium; Ultram [tramadol]; Amikacin; Aspirin; Bee pollen; Doxycycline; Haloperidol; Hymenoptera venom preparations; Imipramine; Sulfur; Adhesive [tape]; Hydroxyzine; and Prednisone  Family History  Problem Relation Age of Onset  . Heart disease Father   . Cancer Mother     lung  . Urolithiasis Neg Hx   . Kidney disease Neg Hx   . Kidney cancer Neg Hx   . Prostate cancer Neg Hx     Social History Social History  Substance  Use Topics  . Smoking status: Former Research scientist (life sciences)  . Smokeless tobacco: Never Used  . Alcohol use No    Review of Systems Constitutional: No fever/chills Eyes: No visual changes. ENT: No sore throat. Cardiovascular: Denies chest pain. Respiratory: Denies shortness of breath. Gastrointestinal: No abdominal pain.  No nausea, no vomiting.  No diarrhea.  No constipation. Genitourinary: Negative for dysuria. Musculoskeletal: Negative for back pain. Skin: Negative for rash. Neurological: Negative for headaches, focal weakness or numbness.  10-point ROS otherwise negative.  ____________________________________________   PHYSICAL EXAM:  VITAL SIGNS: ED Triage Vitals [01/28/16 1536]  Enc Vitals Group     BP 137/86     Pulse Rate 75     Resp 18     Temp 98.6 F (37 C)     Temp Source Oral     SpO2 96 %     Weight 125 lb (56.7 kg)     Height 5\' 3"  (1.6 m)     Head Circumference      Peak Flow      Pain Score 10     Pain Loc      Pain Edu?      Excl. in [place]?     Constitutional: Alert and oriented. Well appearing and in no acute distress. Eyes: Conjunctivae  are normal. PERRL. EOMI. Head: Atraumatic. Nose: No congestion/rhinnorhea. Mouth/Throat: Mucous membranes are moist.  Neck: No stridor.   Cardiovascular: Normal rate, regular rhythm. Grossly normal heart sounds.   Respiratory: Normal respiratory effort.  No retractions. Lungs CTAB. Gastrointestinal: Soft and nontender. No distention.  Musculoskeletal: No lower extremity tenderness nor edema.  No joint effusions. Neurologic:  Normal speech and language. No gross focal neurologic deficits are appreciated.  Skin:  Skin is warm, dry and intact. No rash noted. Psychiatric: Mood and affect are normal. Speech and behavior are normal.  ____________________________________________   LABS (all labs ordered are listed, but only abnormal results are displayed)  Labs Reviewed  COMPREHENSIVE METABOLIC PANEL - Abnormal; Notable for the following:       Result Value   Sodium 129 (*)    Chloride 97 (*)    CO2 21 (*)    All other components within normal limits  CBC  ETHANOL  SALICYLATE LEVEL  ACETAMINOPHEN LEVEL  URINE DRUG SCREEN, QUALITATIVE ([facility] ONLY)  CBC WITH DIFFERENTIAL/PLATELET  COMPREHENSIVE METABOLIC PANEL  URINALYSIS, COMPLETE ([facility]) WITH MICROSCOPIC  URINE DRUG SCREEN, QUALITATIVE ([facility] ONLY)  ACETAMINOPHEN LEVEL  SALICYLATE LEVEL   ____________________________________________  EKG   ____________________________________________  RADIOLOGY   ____________________________________________   PROCEDURES  Procedure(s) performed:   Procedures  Critical Care performed:   ____________________________________________   INITIAL IMPRESSION / ASSESSMENT AND PLAN / ED COURSE  Pertinent labs & imaging results that were available during my care of the patient were reviewed by me and considered in my medical decision making (see chart for details).    Clinical Course   Commitment paperwork upheld. Patient to be seen by psychiatry as well as Education officer, museum. The patient  understands the plan and is willing to comply.   ____________________________________________   FINAL CLINICAL IMPRESSION(S) / ED DIAGNOSES  Noncompliance.    NEW MEDICATIONS STARTED DURING THIS VISIT:  New Prescriptions   No medications on file     Note:  This document was prepared using Dragon voice recognition software and may include unintentional dictation errors.    Orbie Pyo, MD 01/28/16 [phone]

## 2016-01-28 NOTE — ED Notes (Signed)
Pt reports fall 2-3 weeks ago, reports back pain, bilateral knee pain and neck pain, pt wearing soft neck collar in triage for comfort.

## 2016-01-28 NOTE — ED Triage Notes (Signed)
Pt brought to triage with [employer], report IVC by DSS, IVC papers states pt hx mental illness, non-compliant with medications, AMS, and refusing medical treatment.  Pt denies statements on IVC papers.  Pt calm and cooperative at this time.

## 2016-01-28 NOTE — ED Notes (Signed)
Pt changed into scrubs and belongings secured. This tech was with this pt for 45 minutes in triage completing this task!

## 2016-01-28 NOTE — ED Notes (Signed)
TTS with pt att

## 2016-01-29 DIAGNOSIS — E871 Hypo-osmolality and hyponatremia: Secondary | ICD-10-CM | POA: Diagnosis not present

## 2016-01-29 LAB — VALPROIC ACID LEVEL: Valproic Acid Lvl: 24 ug/mL — ABNORMAL LOW (ref 50.0–100.0)

## 2016-01-29 LAB — URINALYSIS, COMPLETE (UACMP) WITH MICROSCOPIC
BILIRUBIN URINE: NEGATIVE
Bacteria, UA: NONE SEEN
Glucose, UA: NEGATIVE mg/dL
Hgb urine dipstick: NEGATIVE
KETONES UR: NEGATIVE mg/dL
LEUKOCYTES UA: NEGATIVE
NITRITE: NEGATIVE
PH: 7 (ref 5.0–8.0)
Protein, ur: NEGATIVE mg/dL
RBC / HPF: NONE SEEN RBC/hpf (ref 0–5)
Specific Gravity, Urine: 1.004 — ABNORMAL LOW (ref 1.005–1.030)

## 2016-01-29 MED ORDER — NAPROXEN 500 MG PO TABS
ORAL_TABLET | ORAL | Status: AC
  Administered 2016-01-29: 250 mg via ORAL
  Filled 2016-01-29: qty 1

## 2016-01-29 MED ORDER — SODIUM CHLORIDE 0.9 % IV BOLUS (SEPSIS): 1000.0000 mL | Freq: Once | INTRAVENOUS | Status: DC

## 2016-01-29 MED ORDER — NAPROXEN 500 MG PO TABS
250.0000 mg | ORAL_TABLET | Freq: Three times a day (TID) | ORAL | Status: DC | PRN
  Administered 2016-01-29: 250 mg via ORAL

## 2016-01-29 NOTE — ED Notes (Signed)
Patient states she wants to go. Social worker in to consult.

## 2016-01-29 NOTE — ED Provider Notes (Addendum)
-----------------------------------------   7:33 AM on 01/29/2016 -----------------------------------------  ----------------------------------------- 7:33 AM on 01/29/2016 -----------------------------------------   Blood pressure 114/73, pulse 70, temperature 98.8 F (37.1 C), temperature source Oral, resp. rate 20, height 5\' 3"  (1.6 m), weight 125 lb (56.7 kg), SpO2 100 %.  The patient had no acute events since last update.  Calm and cooperative at this time.   ----------------------------------------- 12:44 PM on 01/29/2016 -----------------------------------------  Pt sodium is where it has been for the last year or 2. There is no evidence of acute pathology associated with that. Patient has been cleared by psychiatry, she is her own legal guardian and at this time she is requesting discharge. Obviously, there is no evidence of SI or HI and therefore I cannot force her to stay, there is no intervention planned for her at this time in any event. Social work has been in Retail [place] with [facility], and they are arranging outpatient follow-up and disposition housing at the shelter. Patient declines further attention from me at this time I certainly can't give her anything in that regard that she does not accept. We will therefore discharge her on her own recognizance since, I feel that she is capable of making decisions or she wants to go. She would like to start by going by the [facility] to get her stuff, and we are providing her with a cab voucher to do so as well as a passed to the shelter and to bus passes to get her there. Patient is very comfortable with this plan and she is eager to go. Return precautions and follow-up given and understood.   Schuyler Amor, MD 01/29/16 [place], MD 01/29/16 [phone]

## 2016-01-29 NOTE — ED Notes (Signed)
BEHAVIORAL HEALTH ROUNDING Patient sleeping: No. Patient alert and oriented: yes Behavior appropriate: Yes.  ; If no, describe:  Nutrition and fluids offered: Yes  Toileting and hygiene offered: Yes  Sitter present: not applicable Law enforcement present: Yes  

## 2016-01-29 NOTE — ED Notes (Signed)
Pt to room #21 for [facility] att

## 2016-01-29 NOTE — ED Notes (Signed)
Janetta Hora contacted and she states they do not have a room available at [facility]. States that Clara is aware of where Ms. Fletchers belongings are, that they are in a storage facility, and gives her number as [phone]. Patient states she received a phone call and was also told they do not have a room available.

## 2016-01-29 NOTE — ED Notes (Signed)
While patient being prepped for discharge to [facility] and then shelter, Reginia Naas from [place] calls to state she has placement for Ms. Roy at [facility] with Janetta Hora. [facility] number [phone]. Phone given to Ms. Mcferran to consult with DSS and 1 hour phone call results. After patient consulted and states that she will be picked up and taken to a facility.

## 2016-01-29 NOTE — ED Notes (Signed)
Pt reports that she will take an "Happy" home to the [facility], [place], [place], briefed on pt

## 2016-01-29 NOTE — ED Notes (Signed)

## 2016-01-29 NOTE — ED Notes (Signed)
Patient leaves with DSS. Agreeable to plan presented to her by Tiffany. Ambulatory with DSS.

## 2016-01-29 NOTE — ED Notes (Signed)
Plan for DSS to pick patient up tonight and take with plan of placement. Patient awaiting DSS arrival.

## 2016-01-29 NOTE — Progress Notes (Signed)
LCSW met with patient and she reports she is not suicidal or homicidal just here because she is pain. In consultation with EDP Dr Burlene Arnt this patient has been medically and psychiatrically cleared ( [facility]) IVC has been rescinded. In discussion with patient she was clear and lucid and stated she will not go to a [facility], ALF or SNF and refused this LCSW permission to contact her friends or family.   LCSW called After hours APS-  [phone] spoke to a Engineer, drilling and left my call back information and left a detailed message requesting a call back. [phone]- Left [place] DSS APS a detailed message as well.  LCSW provided patient with additional warm blankets. LCSW will put together external resources again for patient to access.  [employer] LCSW [phone]

## 2016-01-29 NOTE — ED Notes (Signed)
Lab called for order add on

## 2016-01-29 NOTE — Clinical Social Work Note (Signed)
Clinical Social Work Assessment  Patient Details  Name: Angela [name] MRN: [id number] Date of Birth: 08-05-1950  Date of referral:  01/29/16               Reason for consult:  Facility Placement                Permission sought to share information with:  [place] granted to share information::  Yes, Verbal Permission Granted  Name::     Lysbeth Galas sister [phone]  Agency::     Relationship::     Contact Information:     Housing/Transportation Living arrangements for the past 2 months:  [place], Homeless Source of Information:  Patient Patient Interpreter Needed:  None Criminal Activity/Legal Involvement Pertinent to Current Situation/Hospitalization:    Significant Relationships:  Adult Children, Friend Lives with:  Self Do you feel safe going back to the place where you live?  Yes Need for family participation in patient care:  Yes (Comment)  Care giving concerns: Patient appears at times clear and lucid then unable to keep her thoughts straight   Social Worker assessment / plan: CSW received consult for facility placement. Pt was from a group home in the area and has reportedly been to several facilities in the last few days. Pt became homeless after walking off from her most recent group home (or getting dismissed by her group home). CSW engaged with pt at pt's bedside. Patient has been followed by [place] ship workers for the last 2 weeks and needs guardianship support. Pt innitially refused to engage with this worker and then once provided with warm bed sheets she disclosed DSS put her up in the [employer]. Pt has made it very clear that she will not return to a group home ALF, or SNF. Pt would like to go to a motel, apartment, or small house. CSW consulted with EDP who agreed patient IVC/medically cleared has been rescinded and she is free to go. LCSW called APS spoke to [facility] and they will transport her back  to [place] and then she will be placed in [place]/. They have started the guardianship process. Employment status:  Retired Forensic scientist:  Information systems manager, Medicaid [place] PT Recommendations:  Not assessed at this time [place] / Referral to community resources:   (Group home ALF and SNF-Patient declined all resources)  Patient/Family's Response to care: No consents given to speak to family or friends  Patient/Family's Understanding of and Emotional Response to Diagnosis, Current Treatment, and Prognosis:  DSS- Will provide housing assistance until she is safely placed in [facility]/or facillity  Emotional Assessment Appearance:  Disheveled Attitude/Demeanor/Rapport:  Aggressive (Verbally and/or physically), Avoidant, Guarded, Angry Affect (typically observed):  Guarded, Irritable Orientation:  Oriented to Self, Oriented to Place, Oriented to  Time, Oriented to Situation Alcohol / Substance use:  Not Applicable Psych involvement (Current and /or in the community):  Outpatient Provider (Dr Nathanial Rancher [facility])  Discharge Needs  Concerns to be addressed:  Discharge Planning Concerns, Care Coordination Readmission within the last 30 days:  Yes Current discharge risk:  Psychiatric Illness, Cognitively Impaired Barriers to Discharge:  Unsafe home situation (patient refusing [place])   [place], [place], LCSW 01/29/2016, 4:15 PM

## 2016-01-29 NOTE — Progress Notes (Signed)
IN consultation with DSS- [place] Guardianship is in Progress for this patient. Angela [name] # [id number]  EDP, ED RN and ED Charge nurse consulted patient is to remain in her ED room until early evening. APS will send a SW to pick her up and take her to hotel this evening ( LCSW informed ED staff APS might not be able to bring her to [facility] until tomorrow morning the latest) Angela [name] will advise all nurses through out the evening of DSS progress.  Patient is agreeable to this discharge plan and will remain in her room. She is agreeable to additionally follow up with her health care professionals Dr Nathanial Rancher at [facility] in the community.  No further needs at this time.  [employer] LCSW [phone]

## 2016-01-31 ENCOUNTER — Encounter: Payer: Medicare Other | Admitting: Physical Therapy

## 2016-02-03 ENCOUNTER — Encounter: Payer: Medicare Other | Admitting: Physical Therapy

## 2016-02-09 ENCOUNTER — Encounter: Payer: Medicare Other | Admitting: Physical Therapy

## 2016-02-23 ENCOUNTER — Encounter: Payer: Medicare Other | Admitting: Physical Therapy

## 2016-02-24 ENCOUNTER — Ambulatory Visit: Payer: Medicare Other | Attending: Obstetrics and Gynecology | Admitting: Occupational Therapy

## 2016-02-24 DIAGNOSIS — M6281 Muscle weakness (generalized): Secondary | ICD-10-CM | POA: Diagnosis present

## 2016-02-24 DIAGNOSIS — M25631 Stiffness of right wrist, not elsewhere classified: Secondary | ICD-10-CM

## 2016-02-24 DIAGNOSIS — M79602 Pain in left arm: Secondary | ICD-10-CM

## 2016-02-24 DIAGNOSIS — M79601 Pain in right arm: Secondary | ICD-10-CM | POA: Diagnosis present

## 2016-02-24 DIAGNOSIS — M25632 Stiffness of left wrist, not elsewhere classified: Secondary | ICD-10-CM | POA: Diagnosis present

## 2016-02-24 NOTE — Patient Instructions (Signed)
Contrast for bilateral hands and wrist  Benik neoprene splints for bilateral hands during day time  Off 2 x day after contrast  For tendon glides  Wrist AROM  10 reps  Pain free

## 2016-02-24 NOTE — Therapy (Signed)
[facility] [address] [place], [address] Phone: [phone]   Fax:  [phone]  Occupational Therapy Evaluation  Patient Details  Name: Angela [name] MRN: [id number] Date of Birth: 13-Nov-1950 Referring Provider: Wyatt Portela  Encounter Date: 02/24/2016      OT End of Session - 02/24/16 1358    Visit Number 1   Number of Visits 8   OT Start Time [id number]   OT Stop Time 1432   OT Time Calculation (min) 65 min   Activity Tolerance Patient tolerated treatment well   Behavior During Therapy [facility] for tasks assessed/performed      Past Medical History:  Diagnosis Date  . Allergic rhinitis   . Anemia   . Bipolar 1 disorder ([place])   . Blurred vision   . Bronchitis   . Chronic constipation   . Chronic sinusitis   . Depression   . Diverticulitis   . Diverticulosis   . Dizziness   . Endometriosis   . Endometriosis   . Facial pain   . GERD (gastroesophageal reflux disease)   . GERD (gastroesophageal reflux disease)   . Hernia, hiatal   . Hypertension   . Hypothyroidism   . Malignant neoplasm of skin   . Migraine   . Neuropathy ([place])   . Otalgia   . Otitis media   . skin cancer   . Thyroid disease     Past Surgical History:  Procedure Laterality Date  . APPENDECTOMY    . CHOLECYSTECTOMY    . CHOLECYSTECTOMY, LAPAROSCOPIC    . ESOPHAGOGASTRODUODENOSCOPY (EGD) WITH PROPOFOL N/A 03/29/2015   Procedure: ESOPHAGOGASTRODUODENOSCOPY (EGD) WITH PROPOFOL;  Surgeon: Josefine Class, MD;  Location: [facility] ENDOSCOPY;  Service: Endoscopy;  Laterality: N/A;  . HERNIA REPAIR    . laproscopy    . TONSILLECTOMY    . uteral suspension      There were no vitals filed for this visit.      Subjective Assessment - 02/24/16 1331    Subjective  Fell in Dec in bathroom and caught yourself on palms- wrist sprains - and hand pain and swelling in fingers - bathing , writing , shooting pains in fingers    Patient  Stated Goals To get your hands better , pain better and able to use them again - also they recommend splints for wrist    Currently in Pain? Yes   Pain Score 8    Pain Location Arm  up to elbow on the R , L wrist and hand   Pain Orientation Right;Left   Pain Descriptors / Indicators Stabbing;Shooting;Aching   Pain Type Acute pain   Pain Onset More than a month ago   Pain Frequency Constant           [facility] OT Assessment - 02/24/16 0001      Assessment   Diagnosis Bilateral wrist sprains    Referring Provider Wyatt Portela   Onset Date 01/12/16     Home  Environment   Lives With Other (Comment)     Prior Function   Level of Independence Independent  assistive living   Vocation On disability   Leisure R hand dominant , reading, playing guitar, piano, photography , travel     AROM   Right Wrist Extension 50 Degrees  pain over volar wrist and palm   Right Wrist Flexion 90 Degrees  middle finger pain    Right Wrist Ulnar Deviation --  pain but  ROM WNL    Left Wrist Extension 50 Degrees  Pain volar wrist and palm   Left Wrist Flexion 90 Degrees   Left Wrist Ulnar Deviation --  pain but ROM WNL     Strength   Right Hand Grip (lbs) 20   Right Hand Lateral Pinch 6 lbs   Right Hand 3 Point Pinch 4 lbs   Left Hand Grip (lbs) 32   Left Hand Lateral Pinch 9 lbs   Left Hand 3 Point Pinch 9 lbs     Right Hand AROM   R Index  MCP 0-90 80 Degrees   R Index PIP 0-100 100 Degrees   R Long  MCP 0-90 85 Degrees   R Long PIP 0-100 100 Degrees   R Ring  MCP 0-90 90 Degrees   R Ring PIP 0-100 100 Degrees   R Little  MCP 0-90 90 Degrees   R Little PIP 0-100 100 Degrees     Left Hand AROM   L Index  MCP 0-90 90 Degrees   L Index PIP 0-100 100 Degrees   L Long  MCP 0-90 90 Degrees   L Long PIP 0-100 100 Degrees   L Ring  MCP 0-90 90 Degrees   L Ring PIP 0-100 100 Degrees   L Little  MCP 0-90 90 Degrees   L Little PIP 0-100 100 Degrees      R hand was done paraffin - and had  decrease pain  L hand and wrist was done fluidotherapy - pt report increase pain in 4th digit - and increase discomfort  Review with pt HEP for tendon glides and AROM for wrist flexion and extention  2 x day but pain free  Pt to cont contrast prior to ROM HEP  Fitted with bilateral Benik wrist neoprene splints to wear during day                    OT Education - 02/24/16 1358    Education provided Yes   Education Details Findings of eval and HEP   Person(s) Educated Patient   Methods Explanation;Demonstration;Tactile cues;Verbal cues;Handout   Comprehension Verbal cues required;Returned demonstration;Verbalized understanding          OT Short Term Goals - 02/24/16 1626      OT SHORT TERM GOAL #1   Title Pain on PRHWE improve by at least 15 points    Baseline pain at [facility] on PRHWE 40/50   Time 3   Period Weeks   Status New     OT SHORT TERM GOAL #2   Title Bilateral wrist extention improve with 10 degrees to turn doorknob   Baseline bilateral wrist extention limited see flowsheet   Time 2   Period Weeks   Status New           OT Long Term Goals - 02/24/16 1628      OT LONG TERM GOAL #1   Title Function on PRWHE improve with 15 points using splints and doing HEP    Baseline Function score at eval for PRWHE 31.5/50   Time 4   Period Weeks   Status New     OT LONG TERM GOAL #2   Title Pt to be ind in HEP for increase AROM in digits and wrist without increase pain more than 3/10 pain    Baseline no splints or knowlegde of HEP    Time 4   Period Weeks   Status New  Plan - 02/24/16 1359    Clinical Impression Statement Pt refer for bilateral wrist sprains -pt report she fell about 6 wks ago and caught herself with bilateral hands -  pt present with bilateral wrist and hand pain - R hand worse than L - and radiating up in volar forearm on R  - R thenar  eminence and 3rd digit worse -  increase pain with wrist AROM and composite fist  , and  grip and prehension strength measurements  - pt was fitted with bilateral wrist neoprene splints , to do contrast baths at home with pain free AROM for wrist and digits     Rehab Potential Good   OT Frequency 2x / week   OT Duration 4 weeks   OT Treatment/Interventions Self-care/ADL training;Fluidtherapy;Splinting;Patient/family education;Therapeutic exercises;Contrast Bath;Ultrasound;Passive range of motion;Parrafin;Manual Therapy   Plan assess progress with HEP    OT Home Exercise Plan see pt instruction   Consulted and Agree with Plan of Care Patient      Patient will benefit from skilled therapeutic intervention in order to improve the following deficits and impairments:  Decreased range of motion, Impaired flexibility, Pain, Impaired UE functional use, Decreased knowledge of precautions, Decreased strength  Visit Diagnosis: Pain in left arm - Plan: Ot plan of care cert/re-cert  Pain in right arm - Plan: Ot plan of care cert/re-cert  Stiffness of left wrist, not elsewhere classified - Plan: Ot plan of care cert/re-cert  Stiffness of right wrist, not elsewhere classified - Plan: Ot plan of care cert/re-cert  Muscle weakness (generalized) - Plan: Ot plan of care cert/re-cert      G-Codes - [id number]    Functional Assessment Tool Used PRWHE , ROM , grip and prehension  - clinical judgement    Functional Limitation Self care   Self Care Current Status [id number]) At least 40 percent but less than 60 percent impaired, limited or restricted   Self Care Goal Status [id number]) At least 1 percent but less than 20 percent impaired, limited or restricted      Problem List Patient Active Problem List   Diagnosis Date Noted  . Personality disorder in adult 01/24/2016  . Absolute anemia 06/24/2015  . Clinical depression 06/24/2015  . Endometriosis 06/24/2015  . BP (high blood pressure) 06/24/2015  . Headache, migraine 06/24/2015  . Disease of thyroid gland 06/24/2015  . PMB  (postmenopausal bleeding) 05/22/2015  . Bipolar 1 disorder ([place]) 04/23/2015  . Anxiety state 04/23/2015  . Colitis, acute 04/20/2015  . Melena 04/20/2015  . Accelerated hypertension 04/20/2015  . TIA (transient ischemic attack) 04/20/2015  . Hypothyroidism 04/20/2015  . GERD (gastroesophageal reflux disease) 04/20/2015  . Chest pain 04/20/2015  . Acute colitis 04/20/2015  . Cervical disc disorder with radiculopathy 03/25/2015  . Cervical pain 03/25/2015  . Pelvic pain in female 10/30/2014  . Bipolar affective disorder ([place]) 06/30/2014  . CA of skin 06/30/2014  . Cephalalgia 12/16/2013  . Neuropathy ([place]) 12/16/2013    Rosalyn Gess OTR/L,CLT 02/24/2016, 4:33 PM  [place] [facility] [address] [place], [address] Phone: [phone]   Fax:  [phone]  Name: Angela [name] MRN: [id number] Date of Birth: [birth year]

## 2016-02-28 ENCOUNTER — Ambulatory Visit (INDEPENDENT_AMBULATORY_CARE_PROVIDER_SITE_OTHER): Payer: Medicare Other | Admitting: Podiatry

## 2016-02-28 ENCOUNTER — Encounter: Payer: Self-pay | Admitting: Podiatry

## 2016-02-28 DIAGNOSIS — M79609 Pain in unspecified limb: Secondary | ICD-10-CM

## 2016-02-28 DIAGNOSIS — Q828 Other specified congenital malformations of skin: Secondary | ICD-10-CM | POA: Diagnosis not present

## 2016-02-28 DIAGNOSIS — B351 Tinea unguium: Secondary | ICD-10-CM | POA: Diagnosis not present

## 2016-02-28 DIAGNOSIS — M722 Plantar fascial fibromatosis: Secondary | ICD-10-CM | POA: Diagnosis not present

## 2016-02-28 NOTE — Progress Notes (Signed)
She presents a chief complaint of bilateral heel pain painful corns and calluses and painful toenails.  Objective: Vital signs are stable she is alert and oriented 3. Pulses are palpable. She still has pain on palpation medial calcaneal tubercle bilateral. She also has thick yellow dystrophic with mycotic nails with painful calluses.  Assessment: Pain in limb standard onychomycosis and plantar fasciitis bilateral. Porokeratosis bilateral.  Plan: Debrided all reactive hyperkeratotic tissue debrided toenails 1 through 5 bilateral. Injected the bilateral heels today with Kenalog and local anesthetic. Follow-up with her as needed.

## 2016-03-01 ENCOUNTER — Ambulatory Visit: Payer: Medicare Other | Admitting: Occupational Therapy

## 2016-03-01 DIAGNOSIS — M79602 Pain in left arm: Secondary | ICD-10-CM

## 2016-03-01 DIAGNOSIS — M79601 Pain in right arm: Secondary | ICD-10-CM

## 2016-03-01 DIAGNOSIS — M25632 Stiffness of left wrist, not elsewhere classified: Secondary | ICD-10-CM

## 2016-03-01 DIAGNOSIS — M6281 Muscle weakness (generalized): Secondary | ICD-10-CM

## 2016-03-01 DIAGNOSIS — M25631 Stiffness of right wrist, not elsewhere classified: Secondary | ICD-10-CM

## 2016-03-01 NOTE — Therapy (Signed)
[facility] [address], [place], [address] Phone: [phone]   Fax:  [phone]  Occupational Therapy Treatment  Patient Details  Name: Angela [name] MRN: [id number] Date of Birth: [birth year] Referring Provider: Wyatt Portela  Encounter Date: 03/01/2016      OT End of Session - 03/01/16 0955    Visit Number 2   Number of Visits 8   Date for OT Re-Evaluation 03/22/16   OT Start Time 0935   OT Stop Time 1027   OT Time Calculation (min) 52 min   Activity Tolerance Patient tolerated treatment well   Behavior During Therapy [facility] for tasks assessed/performed      Past Medical History:  Diagnosis Date  . Allergic rhinitis   . Anemia   . Bipolar 1 disorder ([place])   . Blurred vision   . Bronchitis   . Chronic constipation   . Chronic sinusitis   . Depression   . Diverticulitis   . Diverticulosis   . Dizziness   . Endometriosis   . Endometriosis   . Facial pain   . GERD (gastroesophageal reflux disease)   . GERD (gastroesophageal reflux disease)   . Hernia, hiatal   . Hypertension   . Hypothyroidism   . Malignant neoplasm of skin   . Migraine   . Neuropathy ([place])   . Otalgia   . Otitis media   . skin cancer   . Thyroid disease     Past Surgical History:  Procedure Laterality Date  . APPENDECTOMY    . CHOLECYSTECTOMY    . CHOLECYSTECTOMY, LAPAROSCOPIC    . ESOPHAGOGASTRODUODENOSCOPY (EGD) WITH PROPOFOL N/A 03/29/2015   Procedure: ESOPHAGOGASTRODUODENOSCOPY (EGD) WITH PROPOFOL;  Surgeon: Josefine Class, MD;  Location: [facility] ENDOSCOPY;  Service: Endoscopy;  Laterality: N/A;  . HERNIA REPAIR    . laproscopy    . TONSILLECTOMY    . uteral suspension      There were no vitals filed for this visit.      Subjective Assessment - 03/01/16 0937    Subjective  I could not get hot and cold water to do contrast - loves the braces - just my body it not doing good because of stress   Patient Stated Goals To get your hands better , pain better and able to use them again - also they recommend splints for wrist    Currently in Pain? Yes   Pain Score 8    Pain Location Arm   Pain Orientation Right;Left   Pain Descriptors / Indicators Aching   Pain Onset More than a month ago      R hand and L hand and wrist paraffin -   decrease pain   Soft tissue mobs to hands - webspace and MC spreads  - tender over bilateral thenar eminence Graston tools nr 2 on forearm - volar/dorsal  prior to review of HEP - done sweeping  More tenderness over dorsal   Review  HEP for tendon glides and AROM for wrist flexion and extention  In horizontal plane Pain free range   Cont with  bilateral Benik wrist neoprene splints to wear during day  Discuss with pt joint protection principles - hand out provided  Using larger joints - avoid tight and sustained grip - Picking up objects or carry - like pocket book , writing , using phone, water bottle  OT Treatments/Exercises (OP) - 03/01/16 0001      RUE Paraffin   Number Minutes Paraffin 10 Minutes   RUE Paraffin Location Hand  hand thur forearm   Comments decrease pain at [facility]      LUE Paraffin   Number Minutes Paraffin 10 Minutes   LUE Paraffin Location Hand;Wrist;Forearm   Comments decrease pain at [facility]  in hand thru wrist                OT Education - 03/01/16 0954    Education provided Yes   Education Details Increase HEP   Person(s) Educated Patient   Methods Explanation;Tactile cues;Verbal cues;Demonstration   Comprehension Verbalized understanding;Returned demonstration;Verbal cues required          OT Short Term Goals - 02/24/16 1626      OT SHORT TERM GOAL #1   Title Pain on PRHWE improve by at least 15 points    Baseline pain at [facility] on PRHWE 40/50   Time 3   Period Weeks   Status New     OT SHORT TERM GOAL #2   Title Bilateral wrist extention improve with 10 degrees to turn  doorknob   Baseline bilateral wrist extention limited see flowsheet   Time 2   Period Weeks   Status New           OT Long Term Goals - 02/24/16 1628      OT LONG TERM GOAL #1   Title Function on PRWHE improve with 15 points using splints and doing HEP    Baseline Function score at eval for PRWHE 31.5/50   Time 4   Period Weeks   Status New     OT LONG TERM GOAL #2   Title Pt to be ind in HEP for increase AROM in digits and wrist without increase pain more than 3/10 pain    Baseline no splints or knowlegde of HEP    Time 4   Period Weeks   Status New               Plan - 03/01/16 1439    Clinical Impression Statement Pt cont to report pain in bilateral hands - L over thenar eminence and R going into forearm - dorsal more than volar - pt was ed on joint protection this date - did feel less pain after paraffin - but tender with manual therapy - pt to cont with HEP , joint protection and Benik splints    Rehab Potential Good   OT Frequency 1x / week   OT Duration 4 weeks   OT Treatment/Interventions Self-care/ADL training;Fluidtherapy;Splinting;Patient/family education;Therapeutic exercises;Contrast Bath;Ultrasound;Passive range of motion;Parrafin;Manual Therapy   Plan assess progress with HEP    OT Home Exercise Plan see pt instruction   Consulted and Agree with Plan of Care Patient      Patient will benefit from skilled therapeutic intervention in order to improve the following deficits and impairments:  Decreased range of motion, Impaired flexibility, Pain, Impaired UE functional use, Decreased knowledge of precautions, Decreased strength  Visit Diagnosis: Pain in left arm  Pain in right arm  Stiffness of left wrist, not elsewhere classified  Stiffness of right wrist, not elsewhere classified  Muscle weakness (generalized)    Problem List Patient Active Problem List   Diagnosis Date Noted  . Personality disorder in adult 01/24/2016  . Absolute anemia  06/24/2015  . Clinical depression 06/24/2015  . Endometriosis 06/24/2015  . BP (high blood pressure) 06/24/2015  .  Headache, migraine 06/24/2015  . Disease of thyroid gland 06/24/2015  . PMB (postmenopausal bleeding) 05/22/2015  . Bipolar 1 disorder ([place]) 04/23/2015  . Anxiety state 04/23/2015  . Colitis, acute 04/20/2015  . Melena 04/20/2015  . Accelerated hypertension 04/20/2015  . TIA (transient ischemic attack) 04/20/2015  . Hypothyroidism 04/20/2015  . GERD (gastroesophageal reflux disease) 04/20/2015  . Chest pain 04/20/2015  . Acute colitis 04/20/2015  . Cervical disc disorder with radiculopathy 03/25/2015  . Cervical pain 03/25/2015  . Pelvic pain in female 10/30/2014  . Bipolar affective disorder ([place]) 06/30/2014  . CA of skin 06/30/2014  . Cephalalgia 12/16/2013  . Neuropathy ([place]) 12/16/2013    Barb Shear OTR/L, CLT 03/01/2016, 2:42 PM  [place] [address] [place], [address] Phone: [phone]   Fax:  [phone]  Name: HOLLIN ATHA MRN: [id number] Date of Birth: 1950/12/07

## 2016-03-01 NOTE — Patient Instructions (Addendum)
Cont with  tendon glides and AROM for wrist flexion and extention  In horizontal plane Pain free range   Cont with  bilateral Benik wrist neoprene splints to wear during day  Discuss with pt joint protection principles - hand out provided  Using larger joints - avoid tight and sustained grip - Picking up objects or carry - like pocket book , writing , using phone, water bottle

## 2016-03-07 ENCOUNTER — Other Ambulatory Visit: Payer: Self-pay | Admitting: Orthopedic Surgery

## 2016-03-07 DIAGNOSIS — M542 Cervicalgia: Secondary | ICD-10-CM

## 2016-03-07 DIAGNOSIS — M4712 Other spondylosis with myelopathy, cervical region: Secondary | ICD-10-CM

## 2016-03-08 ENCOUNTER — Ambulatory Visit: Payer: Medicare Other | Admitting: Occupational Therapy

## 2016-03-08 ENCOUNTER — Encounter: Payer: Medicare Other | Admitting: Physical Therapy

## 2016-03-08 DIAGNOSIS — M79601 Pain in right arm: Secondary | ICD-10-CM

## 2016-03-08 DIAGNOSIS — M79602 Pain in left arm: Secondary | ICD-10-CM | POA: Diagnosis not present

## 2016-03-08 DIAGNOSIS — M25632 Stiffness of left wrist, not elsewhere classified: Secondary | ICD-10-CM

## 2016-03-08 DIAGNOSIS — M6281 Muscle weakness (generalized): Secondary | ICD-10-CM

## 2016-03-08 DIAGNOSIS — M25631 Stiffness of right wrist, not elsewhere classified: Secondary | ICD-10-CM

## 2016-03-08 NOTE — Therapy (Signed)
[place] [facility] [address], [place], [address] Phone: [phone]   Fax:  [phone]  Occupational Therapy Treatment and discharge  Patient Details  Name: Angela [name] MRN: [id number] Date of Birth: May 19, 1950 Referring Provider: Wyatt Portela  Encounter Date: 03/08/2016      OT End of Session - 03/08/16 0956    Visit Number 3   Number of Visits 3   Date for OT Re-Evaluation 03/08/16   OT Start Time 0945   OT Stop Time 1031   OT Time Calculation (min) 46 min   Activity Tolerance Patient tolerated treatment well   Behavior During Therapy [facility] for tasks assessed/performed      Past Medical History:  Diagnosis Date  . Allergic rhinitis   . Anemia   . Bipolar 1 disorder ([place])   . Blurred vision   . Bronchitis   . Chronic constipation   . Chronic sinusitis   . Depression   . Diverticulitis   . Diverticulosis   . Dizziness   . Endometriosis   . Endometriosis   . Facial pain   . GERD (gastroesophageal reflux disease)   . GERD (gastroesophageal reflux disease)   . Hernia, hiatal   . Hypertension   . Hypothyroidism   . Malignant neoplasm of skin   . Migraine   . Neuropathy ([place])   . Otalgia   . Otitis media   . skin cancer   . Thyroid disease     Past Surgical History:  Procedure Laterality Date  . APPENDECTOMY    . CHOLECYSTECTOMY    . CHOLECYSTECTOMY, LAPAROSCOPIC    . ESOPHAGOGASTRODUODENOSCOPY (EGD) WITH PROPOFOL N/A 03/29/2015   Procedure: ESOPHAGOGASTRODUODENOSCOPY (EGD) WITH PROPOFOL;  Surgeon: Josefine Class, MD;  Location: [facility] ENDOSCOPY;  Service: Endoscopy;  Laterality: N/A;  . HERNIA REPAIR    . laproscopy    . TONSILLECTOMY    . uteral suspension      There were no vitals filed for this visit.      Subjective Assessment - 03/08/16 0948    Subjective  This past Saturday after packing up my room - and using them - my pinkie on R hand got tender and swelling - and  the whole R hand fingers was like sausage    Patient Stated Goals To get your hands better , pain better and able to use them again - also they recommend splints for wrist    Currently in Pain? No/denies            [facility] OT Assessment - 03/08/16 0001      Strength   Right Hand Grip (lbs) 38   Right Hand Lateral Pinch 12 lbs   Right Hand 3 Point Pinch 10 lbs   Left Hand Grip (lbs) 42   Left Hand Lateral Pinch 14 lbs   Left Hand 3 Point Pinch 10 lbs                  OT Treatments/Exercises (OP) - 03/08/16 0001      RUE Paraffin   Number Minutes Paraffin 10 Minutes   RUE Paraffin Location Hand  Hand   Comments decrease pain at [facility]     LUE Paraffin   Number Minutes Paraffin 10 Minutes   LUE Paraffin Location Hand   Comments decrease pain at [facility]       Grip and prehension assess - see flowsheet R hand and L hand and wrist paraffin -  decrease pain   Soft tissue mobs to hands - webspace and MC spreads  - tender over bilateral thenar eminence Soft tissue mobs over bilateral volar wrist - R worse than L - tenderness    Review  HEP for tendon glides and AROM for wrist flexion and extention  In horizontal plane Pain free range   Cont with  bilateral Benik wrist neoprene splints to wear during day  Reinforce  with pt joint protection principles - hand out provided last time   Using larger joints - avoid tight and sustained grip - Picking up objects or carry - using larger joints - pt demo how she use walker, water bottles and pocketb           OT Education - 03/08/16 0956    Education provided Yes   Education Details increase HEP and discharge instructions   Person(s) Educated Patient   Methods Explanation;Demonstration;Tactile cues   Comprehension Returned demonstration;Verbalized understanding;Verbal cues required          OT Short Term Goals - 03/08/16 1440      OT SHORT TERM GOAL #1   Title Pain on PRHWE improve by at least 15 points     Baseline pain at [facility] on PRHWE 40/50  NT - but did not had pain with grip and prehension like eval   Status Partially Met     OT SHORT TERM GOAL #2   Title Bilateral wrist extention improve with 10 degrees to turn doorknob   Status Achieved           OT Long Term Goals - 03/08/16 1441      OT LONG TERM GOAL #1   Title Function on PRWHE improve with 15 points using splints and doing HEP    Baseline Function score at eval for PRWHE 31.5/50 - NT but pt ed on joint protection - and observe pt using hands with walker , waterbottles, doors, bags in clinici   Status Partially Met     OT LONG TERM GOAL #2   Title Pt to be ind in HEP for increase AROM in digits and wrist without increase pain more than 3/10 pain    Status Achieved               Plan - 03/08/16 0957    Clinical Impression Statement Pt made great progress in grip and prehension in R and L hand in 3 visits - still pain over thenar eminence and guyon's canal on R - pt reporting had some pain in 5th , and swelling in diigits over the weekend but did not see any of that this session - AROM WFL , grip and prehension up - pt still has pain but appear to use hands functionally - pt ed on joint protection and fittted with bilateral Benik wrist splint to use as needed - pt discharge with home program    OT Treatment/Interventions Self-care/ADL training;Fluidtherapy;Splinting;Patient/family education;Therapeutic exercises;Contrast Bath;Ultrasound;Passive range of motion;Parrafin;Manual Therapy   Plan discharge instructions   OT Home Exercise Plan see pt instruction   Consulted and Agree with Plan of Care Patient      Patient will benefit from skilled therapeutic intervention in order to improve the following deficits and impairments:     Visit Diagnosis: Pain in left arm  Pain in right arm  Stiffness of left wrist, not elsewhere classified  Stiffness of right wrist, not elsewhere classified  Muscle weakness  (generalized)    Problem List Patient Active Problem List  Diagnosis Date Noted  . Personality disorder in adult 01/24/2016  . Absolute anemia 06/24/2015  . Clinical depression 06/24/2015  . Endometriosis 06/24/2015  . BP (high blood pressure) 06/24/2015  . Headache, migraine 06/24/2015  . Disease of thyroid gland 06/24/2015  . PMB (postmenopausal bleeding) 05/22/2015  . Bipolar 1 disorder ([place]) 04/23/2015  . Anxiety state 04/23/2015  . Colitis, acute 04/20/2015  . Melena 04/20/2015  . Accelerated hypertension 04/20/2015  . TIA (transient ischemic attack) 04/20/2015  . Hypothyroidism 04/20/2015  . GERD (gastroesophageal reflux disease) 04/20/2015  . Chest pain 04/20/2015  . Acute colitis 04/20/2015  . Cervical disc disorder with radiculopathy 03/25/2015  . Cervical pain 03/25/2015  . Pelvic pain in female 10/30/2014  . Bipolar affective disorder ([place]) 06/30/2014  . CA of skin 06/30/2014  . Cephalalgia 12/16/2013  . Neuropathy ([place]) 12/16/2013    Rosalyn Gess OTR/L,CLT 03/08/2016, 2:43 PM  [place] [facility] [address] [place], [address] Phone: [phone]   Fax:  [phone]  Name: Angela [name] MRN: [id number] Date of Birth: 1950-06-12

## 2016-03-08 NOTE — Patient Instructions (Addendum)
Cont with HEP for tendon glides and wrist ROM  Cont with  bilateral Benik wrist neoprene splints to wear during day  Reinforce  with pt joint protection principles - hand out provided last time   Using larger joints - avoid tight and sustained grip - Picking up objects or carry - using larger joints - pt demo how she use walker, water bottles and pocketbook

## 2016-03-17 ENCOUNTER — Ambulatory Visit (HOSPITAL_COMMUNITY): Admission: RE | Admit: 2016-03-17 | Payer: Medicare Other | Source: Ambulatory Visit

## 2016-03-22 ENCOUNTER — Encounter: Payer: Medicare Other | Admitting: Physical Therapy

## 2016-03-24 ENCOUNTER — Ambulatory Visit: Payer: Medicare Other

## 2016-03-30 ENCOUNTER — Ambulatory Visit (INDEPENDENT_AMBULATORY_CARE_PROVIDER_SITE_OTHER): Payer: Medicare Other | Admitting: Neurology

## 2016-03-30 ENCOUNTER — Encounter: Payer: Self-pay | Admitting: Neurology

## 2016-03-30 VITALS — BP 142/91 | HR 67 | Ht 63.0 in | Wt 126.0 lb

## 2016-03-30 DIAGNOSIS — F39 Unspecified mood [affective] disorder: Secondary | ICD-10-CM | POA: Diagnosis not present

## 2016-03-30 DIAGNOSIS — E039 Hypothyroidism, unspecified: Secondary | ICD-10-CM | POA: Diagnosis not present

## 2016-03-30 DIAGNOSIS — R413 Other amnesia: Secondary | ICD-10-CM

## 2016-03-30 DIAGNOSIS — R269 Unspecified abnormalities of gait and mobility: Secondary | ICD-10-CM

## 2016-03-30 DIAGNOSIS — G3184 Mild cognitive impairment, so stated: Secondary | ICD-10-CM | POA: Insufficient documentation

## 2016-03-30 DIAGNOSIS — Z659 Problem related to unspecified psychosocial circumstances: Secondary | ICD-10-CM

## 2016-03-30 NOTE — Progress Notes (Signed)
PATIENT: Angela [name] DOB: 1950/04/01  Chief Complaint  Patient presents with  . Memory Loss    MMSE 27/28 (unable to write) - 12 animals. She is here with a peer support representative from [employer]. She has short-term memory loss.  . Gait Problem    She uses a rolling walker for assistance with ambulation.  Reports having frequent falls.  Marland Kitchen PCP    Herminio Commons, MD     HISTORICAL  Angela [name] is a 66 year old right-handed female, seen in refer by her primary care physician Dr.Sole for evaluation of memory loss, gait abnormality, initial evaluation was on March 31 2016.  She is accompanied by her peers support representative from [employer]  We have reviewed and summarized the referring note, she had a history of significant mood disorder, Hypertension, hypothyroidism, over the past few months to years, she has significant stress, had housing issues, currently lives at a hotel. It is very difficult to get history from patient.  I was able to review the most recent [facility] record from orthopedic surgeon Dr. Macario Carls in March 2018, she was evaluated for neck pain, diffuse body achy pain, per record, cervical MRI 03.03.2017 -  --C4-C5: Disc height loss. Circumferential disc bulge. Ligamentum flavum hypertrophy. Mild spinal canal stenosis. Moderate left neuroforaminal narrowing. --C5-C6: Disc height loss. No disc bulge. Bilateral uncovertebral and facet hypertrophy. No spinal canal or neuroforaminal narrowing.. --C6-C7: Mild bilateral uncovertebral and facet hypertrophy. No disc bulge, spinal canal stenosis, or neuroforaminal stenosis. --C7-T1: Increased or signal about the left C7 inferior articulating facet and left T1 superior articulating facet. There is no evidence of spinal stenosis, disc bulge or neural foraminal narrowing    I have personally reviewed MRI of the brain in April 2017, mild generalized atrophy, no acute abnormality,  MRA of the brain was normal  CT head without contrast in December 2017 showed no significant abnormality.  Laboratory evaluations in January 2018, sodium was decreased 129, creatinine 0.81, chloride 97, normal CBC, hemoglobin of 13.5, urine drug screen was negative  HIV was negative in August 2017, vitamin B1 was 119 within normal limit, significantly elevated TSH 10.20 April 2015, A1c 5.7,  Today she focus on her frequent falling since 2017, she described diffuse body achy pain, multiple joints pain, mood swings, gradually worsening gait abnormality, had 3 major falls since 2017,   I reviewed multiple ED and hospital admission since 2017, acute colitis in April 2017, hypertension, bipolar disorder, noncompliance with medications, previous numbness transient aphasia with negative MRI MRA workup in April 2017,  She was taken to emergency room from boarding room via  EMS, tripped and fell in the restroom, striking her forehead against toilet, complains of neck pain, headaches,   Personally reviewed CT head without contrast on January 12 2016,no acute intracranial abnormality, CT cervical showed multilevel mild degenerative disease,  MRI cervical spine in June 2016, mild degenerative changes no significant canal or foraminal stenosis. She denies bowel and bladder incontinence  REVIEW OF SYSTEMS: Full 14 system review of systems performed and notable only for blurry vision, headaches, numbness, allergy, racing thoughts ALLERGIES: Allergies  Allergen Reactions  . Bee Venom Hives  . Darvon [Propoxyphene] Anaphylaxis and Hives    Hives/throat swelling Hives/throat swelling Hives/throat swelling Hives/throat swelling  . Dicyclomine     Other reaction(s): Other (See Comments) Pt states if she takes too much she'll start itching all over  . Other Hives    Other reaction(s): Other (  See Comments) Other Reaction: Not Assessed Other reaction(s): Unknown Spider bites Spider bites  .  Oxycodone-Acetaminophen Other (See Comments)    hallucinations Other reaction(s): Unknown hallucinations hallucinations  . Peanuts [Peanut Oil] Anaphylaxis and Hives    Hives/throat swelling Hives/throat swelling  . Penicillins Hives and Other (See Comments)    Other reaction(s): Unknown Has patient had a PCN reaction causing immediate rash, facial/tongue/throat swelling, SOB or lightheadedness with hypotension: Unsure  Has patient had a PCN reaction causing severe rash involving mucus membranes or skin necrosis: Unsure  Has patient had a PCN reaction that required hospitalization Unsure  Has patient had a PCN reaction occurring within the last 10 years: Unsure  If all of the above answers are "NO", then may proceed with Cephalosporin use.  Marland Kitchen Percocet [Oxycodone-Acetaminophen] Nausea And Vomiting and Anxiety    Reaction:  Hallucinations   . Sulfa Antibiotics Anaphylaxis, Hives and Itching    Hives/throat swelling  . Sulfacetamide Sodium Anaphylaxis, Hives and Itching    Hives/throat swelling Hives/throat swelling  . Ultram [Tramadol] Hives    Other reaction(s): Other (See Comments) Other Reaction: Allergy  . Amikacin Nausea And Vomiting    Other reaction(s): Unknown  . Aspirin Hives  . Bee Pollen     Other reaction(s): Unknown  . Doxycycline Photosensitivity  . Haloperidol Nausea And Vomiting    Other reaction(s): Unknown  . Hymenoptera Venom Preparations Hives  . Imipramine Hives    Other reaction(s): Unknown  . Sulfur Hives  . Adhesive [Tape] Hives and Rash  . Hydroxyzine Anxiety and Itching  . Prednisone Nausea And Vomiting, Anxiety and Other (See Comments)    Other reaction(s): Unknown Crazy mood swings, strange thoughts Other reaction(s): Other (See Comments)  Crazy mood swings, strange thoughts Reaction:  Strange thoughts     HOME MEDICATIONS: Current Outpatient Prescriptions  Medication Sig Dispense Refill  . amLODipine (NORVASC) 2.5 MG tablet Take 2.5 mg by  mouth daily.    . Biotin 5000 MCG CAPS Take 1 capsule by mouth daily.     . calcium carbonate (OS-CAL) 600 MG TABS tablet Take 600 mg by mouth 2 (two) times daily with a meal.     . divalproex (DEPAKOTE) 250 MG DR tablet Take 1 tablet (250 mg total) by mouth at bedtime. 30 tablet 1  . levothyroxine (SYNTHROID, LEVOTHROID) 88 MCG tablet Take 88 mcg by mouth daily before breakfast. Reported on 06/24/2015    . Linaclotide (LINZESS) 290 MCG CAPS capsule Take 290 mcg by mouth daily.    . pantoprazole (PROTONIX) 40 MG tablet Take 1 tablet (40 mg total) by mouth 2 (two) times daily before a meal. 60 tablet 0  . PARoxetine (PAXIL) 20 MG tablet Take 1 tablet (20 mg total) by mouth daily. 30 tablet 1  . PARoxetine (PAXIL) 30 MG tablet Take 40 mg by mouth daily.     . ranitidine ([employer]S RANITIDINE) 75 MG tablet Take by mouth.    Marland Kitchen tiZANidine (ZANAFLEX) 4 MG tablet Take 4 mg by mouth every 6 (six) hours as needed for muscle spasms.    . traZODone (DESYREL) 50 MG tablet Take 1 tablet (50 mg total) by mouth at bedtime. 30 tablet 1  . zolpidem (AMBIEN) 10 MG tablet Take 10 mg by mouth at bedtime as needed for sleep.     No current facility-administered medications for this visit.     PAST MEDICAL HISTORY: Past Medical History:  Diagnosis Date  . Allergic rhinitis   . Anemia   .  Bipolar 1 disorder ([place])   . Blurred vision   . Bronchitis   . Chronic constipation   . Chronic sinusitis   . Depression   . Diverticulitis   . Diverticulosis   . Dizziness   . Endometriosis   . Endometriosis   . Facial pain   . Falls   . GERD (gastroesophageal reflux disease)   . GERD (gastroesophageal reflux disease)   . Hernia, hiatal   . Hypertension   . Hypothyroidism   . Malignant neoplasm of skin   . Migraine   . Neuropathy ([place])   . Otalgia   . Otitis media   . skin cancer   . Thyroid disease     PAST SURGICAL HISTORY: Past Surgical History:  Procedure Laterality Date  . APPENDECTOMY    .  CHOLECYSTECTOMY    . CHOLECYSTECTOMY, LAPAROSCOPIC    . ESOPHAGOGASTRODUODENOSCOPY (EGD) WITH PROPOFOL N/A 03/29/2015   Procedure: ESOPHAGOGASTRODUODENOSCOPY (EGD) WITH PROPOFOL;  Surgeon: Josefine Class, MD;  Location: [facility] ENDOSCOPY;  Service: Endoscopy;  Laterality: N/A;  . HERNIA REPAIR    . laproscopy    . TONSILLECTOMY    . uteral suspension      FAMILY HISTORY: Family History  Problem Relation Age of Onset  . Heart disease Father   . Cancer Mother     lung  . Urolithiasis Neg Hx   . Kidney disease Neg Hx   . Kidney cancer Neg Hx   . Prostate cancer Neg Hx     SOCIAL HISTORY:  Social History   Social History  . Marital status: Single    Spouse name: N/A  . Number of children: 0  . Years of education: 1.5 years of college   Occupational History  . Retired    Social History Main Topics  . Smoking status: Former Research scientist (life sciences)  . Smokeless tobacco: Never Used  . Alcohol use No  . Drug use: No  . Sexual activity: Not Currently   Other Topics Concern  . Not on file   Social History Narrative   Lives at home alone.   Right-handed.   No daily caffeine use.     PHYSICAL EXAM   Vitals:   03/30/16 1129  BP: (!) 142/91  Pulse: 67  Weight: 126 lb (57.2 kg)  Height: 5\' 3"  (1.6 m)    Not recorded      Body mass index is 22.32 kg/m.  PHYSICAL EXAMNIATION:  Gen: NAD, conversant, well nourised, obese, well groomed                     Cardiovascular: Regular rate rhythm, no peripheral edema, warm, nontender. Eyes: Conjunctivae clear without exudates or hemorrhage Neck: Supple, no carotid bruits. Pulmonary: Clear to auscultation bilaterally   NEUROLOGICAL EXAM:  MENTAL STATUS: Speech:    Speech is pressed , fluent and spontaneous with normal comprehension.  Cognition: MMSE 27/28, animal naming 12.     Orientation to time, place and person     Recent and remote memory: she missed 2/3 recalls     Normal Attention span and concentration     Normal  Language, naming, repeating,spontaneous speech     Fund of knowledge   CRANIAL NERVES: CN II: Visual fields are full to confrontation. Fundoscopic exam is normal with sharp discs and no vascular changes. Pupils are round equal and briskly reactive to light. CN III, IV, VI: extraocular movement are normal. No ptosis. CN V: Facial sensation is intact to pinprick in  all 3 divisions bilaterally. Corneal responses are intact.  CN VII: Face is symmetric with normal eye closure and smile. CN VIII: Hearing is normal to rubbing fingers CN IX, X: Palate elevates symmetrically. Phonation is normal. CN XI: Head turning and shoulder shrug are intact CN XII: Tongue is midline with normal movements and no atrophy.  MOTOR: There is no pronator drift of out-stretched arms. Muscle bulk and tone are normal. Muscle strength is normal.  REFLEXES: Reflexes are 3 and symmetric at the biceps, triceps, knees, and ankles. Plantar responses are flexor.  SENSORY: Intact to light touch, pinprick, positional sensation and vibratory sensation are intact in fingers and toes.  COORDINATION: Rapid alternating movements and fine finger movements are intact. There is no dysmetria on finger-to-nose and heel-knee-shin.    GAIT/STANCE: Need push up to get up from seated position, wide base, exaggerated posturing,   DIAGNOSTIC DATA (LABS, IMAGING, TESTING) - I reviewed patient records, labs, notes, testing and imaging myself where available.   ASSESSMENT AND PLAN  Angela [name] is a 66 y.o. female   bipolar disorder Gait abnormality  Hyperreflexia on examination, MRI of the brain, and cervical spine only showed mild abnormality, not explain the significant hyperreflexia,  Laboratory evaluations, including vitamin B12, copper level  If she continue have significant progressive gait abnormality, may consider MRI of the thoracic spine, Social Problem:  Difficulty with housing placements   Marcial Pacas, M.D.  Ph.D.  [facility] [address] [place], [place] [address] Ph: [phone] Fax: [phone]  CC: Herminio Commons, MD

## 2016-04-01 LAB — COMPREHENSIVE METABOLIC PANEL
ALT: 19 IU/L (ref 0–32)
AST: 23 IU/L (ref 0–40)
Albumin/Globulin Ratio: 1.8 (ref 1.2–2.2)
Albumin: 4.8 g/dL (ref 3.6–4.8)
Alkaline Phosphatase: 95 IU/L (ref 39–117)
BUN/Creatinine Ratio: 18 (ref 12–28)
BUN: 13 mg/dL (ref 8–27)
Bilirubin Total: 0.2 mg/dL (ref 0.0–1.2)
CALCIUM: 10 mg/dL (ref 8.7–10.3)
CO2: 27 mmol/L (ref 18–29)
CREATININE: 0.71 mg/dL (ref 0.57–1.00)
Chloride: 94 mmol/L — ABNORMAL LOW (ref 96–106)
GFR, EST AFRICAN AMERICAN: 101 mL/min/{1.73_m2} (ref >59)
GFR, EST NON AFRICAN AMERICAN: 88 mL/min/{1.73_m2} (ref >59)
GLOBULIN, TOTAL: 2.7 g/dL (ref 1.5–4.5)
Glucose: 85 mg/dL (ref 65–99)
Potassium: 4.8 mmol/L (ref 3.5–5.2)
SODIUM: 136 mmol/L (ref 134–144)
TOTAL PROTEIN: 7.5 g/dL (ref 6.0–8.5)

## 2016-04-01 LAB — VITAMIN B12: Vitamin B-12: 568 pg/mL (ref 232–1245)

## 2016-04-01 LAB — THYROID PANEL WITH TSH
FREE THYROXINE INDEX: 2.9 (ref 1.2–4.9)
T3 Uptake Ratio: 31 % (ref 24–39)
T4 TOTAL: 9.2 ug/dL (ref 4.5–12.0)
TSH: 0.859 u[IU]/mL (ref 0.450–4.500)

## 2016-04-01 LAB — VITAMIN D 25 HYDROXY (VIT D DEFICIENCY, FRACTURES): Vit D, 25-Hydroxy: 35.5 ng/mL (ref 30.0–100.0)

## 2016-04-01 LAB — FOLATE: Folate: >20 ng/mL (ref >3.0)

## 2016-04-01 LAB — C-REACTIVE PROTEIN: CRP: 2.2 mg/L (ref 0.0–4.9)

## 2016-04-01 LAB — SEDIMENTATION RATE: Sed Rate: 12 mm/hr (ref 0–40)

## 2016-04-01 LAB — CK: Total CK: 144 U/L (ref 24–173)

## 2016-04-01 LAB — ANA W/REFLEX: ANA: NEGATIVE

## 2016-04-01 LAB — COPPER, SERUM: COPPER: 140 ug/dL (ref 72–166)

## 2016-04-05 ENCOUNTER — Encounter: Payer: Medicare Other | Admitting: Physical Therapy

## 2016-05-03 ENCOUNTER — Emergency Department: Payer: Medicare Other

## 2016-05-03 ENCOUNTER — Emergency Department
Admission: EM | Admit: 2016-05-03 | Discharge: 2016-05-03 | Disposition: A | Discharge status: Home / Self Care | Payer: Medicare Other | Attending: Student in an Organized Health Care Education/Training Program | Admitting: Student in an Organized Health Care Education/Training Program

## 2016-05-03 ENCOUNTER — Encounter: Payer: Self-pay | Admitting: Emergency Medicine

## 2016-05-03 DIAGNOSIS — Z79899 Other long term (current) drug therapy: Secondary | ICD-10-CM | POA: Insufficient documentation

## 2016-05-03 DIAGNOSIS — Z85820 Personal history of malignant melanoma of skin: Secondary | ICD-10-CM | POA: Insufficient documentation

## 2016-05-03 DIAGNOSIS — R531 Weakness: Secondary | ICD-10-CM | POA: Insufficient documentation

## 2016-05-03 DIAGNOSIS — E039 Hypothyroidism, unspecified: Secondary | ICD-10-CM | POA: Diagnosis not present

## 2016-05-03 DIAGNOSIS — R55 Syncope and collapse: Secondary | ICD-10-CM | POA: Diagnosis not present

## 2016-05-03 DIAGNOSIS — Z87891 Personal history of nicotine dependence: Secondary | ICD-10-CM | POA: Diagnosis not present

## 2016-05-03 DIAGNOSIS — I10 Essential (primary) hypertension: Secondary | ICD-10-CM | POA: Insufficient documentation

## 2016-05-03 LAB — URINALYSIS, COMPLETE (UACMP) WITH MICROSCOPIC
BACTERIA UA: NONE SEEN
Bilirubin Urine: NEGATIVE
Glucose, UA: NEGATIVE mg/dL
Hgb urine dipstick: NEGATIVE
Ketones, ur: NEGATIVE mg/dL
Leukocytes, UA: NEGATIVE
Nitrite: NEGATIVE
PH: 8 (ref 5.0–8.0)
Protein, ur: NEGATIVE mg/dL
SPECIFIC GRAVITY, URINE: 1.002 — AB (ref 1.005–1.030)

## 2016-05-03 LAB — CBC
HEMATOCRIT: 39.2 % (ref 35.0–47.0)
Hemoglobin: 13.4 g/dL (ref 12.0–16.0)
MCH: 31.2 pg (ref 26.0–34.0)
MCHC: 34.1 g/dL (ref 32.0–36.0)
MCV: 91.7 fL (ref 80.0–100.0)
PLATELETS: 340 10*3/uL (ref 150–440)
RBC: 4.28 MIL/uL (ref 3.80–5.20)
RDW: 13.4 % (ref 11.5–14.5)
WBC: 6.9 10*3/uL (ref 3.6–11.0)

## 2016-05-03 LAB — BASIC METABOLIC PANEL
Anion gap: 10 (ref 5–15)
BUN: 7 mg/dL (ref 6–20)
CHLORIDE: 98 mmol/L — AB (ref 101–111)
CO2: 25 mmol/L (ref 22–32)
CREATININE: 0.66 mg/dL (ref 0.44–1.00)
Calcium: 9.5 mg/dL (ref 8.9–10.3)
GFR calc Af Amer: >60 mL/min (ref >60)
GFR calc non Af Amer: >60 mL/min (ref >60)
GLUCOSE: 89 mg/dL (ref 65–99)
Potassium: 4 mmol/L (ref 3.5–5.1)
SODIUM: 133 mmol/L — AB (ref 135–145)

## 2016-05-03 LAB — VALPROIC ACID LEVEL: Valproic Acid Lvl: 60 ug/mL (ref 50.0–100.0)

## 2016-05-03 MED ORDER — HALOPERIDOL 1 MG PO TABS
1.0000 mg | ORAL_TABLET | Freq: Once | ORAL | Status: DC
  Filled 2016-05-03: qty 1

## 2016-05-03 MED ORDER — PAROXETINE HCL 20 MG PO TABS
20.0000 mg | ORAL_TABLET | Freq: Every day | ORAL | Status: DC
  Administered 2016-05-03: 20 mg via ORAL
  Filled 2016-05-03: qty 1

## 2016-05-03 NOTE — ED Notes (Addendum)
[place] and spoke to nursing staff who stated that pt has hx of schizophrenia, had been working with Education officer, [employer] today who was challenging pt on hoarding behaviors. Pt became upset during this process. Pt reports "I must have passed out. I could hear the social worker saying 'oh, she must have fallen asleep.'" Staff report pt was able to ambulate normally around room until EMS arrived when she became limp and needed to be lifted onto EMS stretcher.

## 2016-05-03 NOTE — Discharge Instructions (Signed)
Follow up with PCP. Return for any additional concerns or questions. 

## 2016-05-03 NOTE — ED Notes (Signed)

## 2016-05-03 NOTE — ED Notes (Signed)
Verified with cab driver pt returning to [facility]. Call placed to [facility] to notify staff pt is on her way.

## 2016-05-03 NOTE — ED Notes (Signed)
[place] to ask about transportation back to facility. Facility stated they sent Ladona Mow to pick pt up. Received call from first nurse stating cab is outside waiting for patient.

## 2016-05-03 NOTE — ED Notes (Signed)
Pt placed on bedpan

## 2016-05-03 NOTE — ED Notes (Signed)
Placed patient on bedpan

## 2016-05-03 NOTE — ED Notes (Signed)
Pt offered Paxil and Haldol per MD order. Pt agreed to take Paxil but refused Haldol because "it's too strong. I don't know why he would give me something so strong." Pt educated the dose ordered is not extremely high, pt still refused. Pt states, "usually they give me ativan. I want to talk to the doctor and I want a diagnosis that isn't psychiatric." Pt noted to sit upright in bed without difficulty to take PO meds. Pt also noted to lift her buttocks completely off of the mattress when second nurse placed pt on bedpan. Pt able to move all extremities without difficulty or focal weakness.

## 2016-05-03 NOTE — ED Provider Notes (Signed)
[facility] Emergency Department Provider Note    First MD Initiated Contact with Patient 05/03/16 1310     (approximate)  I have reviewed the triage vital signs and the nursing notes.   HISTORY  Chief Complaint Weakness    HPI Angela [name] is a 66 y.o. female with history of psychiatric history. Patient presents from ER after "syncopal episodes". She is arguing with Education officer, [employer] and then take passed out. Social worker states that the patient was responsive during this episode.  No seizure-like activity. Patient currently presents to the ER stating that she just was to have her medications changed. Denies any SI or HI.   Past Medical History:  Diagnosis Date  . Allergic rhinitis   . Anemia   . Bipolar 1 disorder ([place])   . Blurred vision   . Bronchitis   . Chronic constipation   . Chronic sinusitis   . Depression   . Diverticulitis   . Diverticulosis   . Dizziness   . Endometriosis   . Endometriosis   . Facial pain   . Falls   . GERD (gastroesophageal reflux disease)   . GERD (gastroesophageal reflux disease)   . Hernia, hiatal   . Hypertension   . Hypothyroidism   . Malignant neoplasm of skin   . Migraine   . Neuropathy   . Otalgia   . Otitis media   . skin cancer   . Thyroid disease    Family History  Problem Relation Age of Onset  . Heart disease Father   . Cancer Mother     lung  . Urolithiasis Neg Hx   . Kidney disease Neg Hx   . Kidney cancer Neg Hx   . Prostate cancer Neg Hx    Past Surgical History:  Procedure Laterality Date  . APPENDECTOMY    . CHOLECYSTECTOMY    . CHOLECYSTECTOMY, LAPAROSCOPIC    . ESOPHAGOGASTRODUODENOSCOPY (EGD) WITH PROPOFOL N/A 03/29/2015   Procedure: ESOPHAGOGASTRODUODENOSCOPY (EGD) WITH PROPOFOL;  Surgeon: Josefine Class, MD;  Location: [facility] ENDOSCOPY;  Service: Endoscopy;  Laterality: N/A;  . HERNIA REPAIR    . laproscopy    . TONSILLECTOMY    . uteral suspension     Patient  Active Problem List   Diagnosis Date Noted  . Memory loss 03/30/2016  . Gait abnormality 03/30/2016  . Mood disorder ([place]) 03/30/2016  . Personality disorder in adult 01/24/2016  . Absolute anemia 06/24/2015  . Clinical depression 06/24/2015  . Endometriosis 06/24/2015  . BP (high blood pressure) 06/24/2015  . Headache, migraine 06/24/2015  . Disease of thyroid gland 06/24/2015  . PMB (postmenopausal bleeding) 05/22/2015  . Bipolar 1 disorder ([place]) 04/23/2015  . Anxiety state 04/23/2015  . Colitis, acute 04/20/2015  . Melena 04/20/2015  . Accelerated hypertension 04/20/2015  . TIA (transient ischemic attack) 04/20/2015  . Hypothyroidism 04/20/2015  . GERD (gastroesophageal reflux disease) 04/20/2015  . Chest pain 04/20/2015  . Acute colitis 04/20/2015  . Cervical disc disorder with radiculopathy 03/25/2015  . Cervical pain 03/25/2015  . Pelvic pain in female 10/30/2014  . Bipolar affective disorder ([place]) 06/30/2014  . CA of skin 06/30/2014  . Cephalalgia 12/16/2013  . Neuropathy 12/16/2013      Prior to Admission medications   Medication Sig Start Date End Date Taking? Authorizing Provider  amLODipine (NORVASC) 2.5 MG tablet Take 2.5 mg by mouth daily.    Historical Provider, MD  Biotin 5000 MCG CAPS Take 1 capsule by mouth daily.  Historical Provider, MD  calcium carbonate (OS-CAL) 600 MG TABS tablet Take 600 mg by mouth 2 (two) times daily with a meal.     Historical Provider, MD  divalproex (DEPAKOTE) 250 MG DR tablet Take 1 tablet (250 mg total) by mouth at bedtime. 01/25/16   Gonzella Lex, MD  levothyroxine (SYNTHROID, LEVOTHROID) 88 MCG tablet Take 88 mcg by mouth daily before breakfast. Reported on 06/24/2015    Historical Provider, MD  Linaclotide (LINZESS) 290 MCG CAPS capsule Take 290 mcg by mouth daily.    Historical Provider, MD  pantoprazole (PROTONIX) 40 MG tablet Take 1 tablet (40 mg total) by mouth 2 (two) times daily before a meal. 04/26/15   Demetrios Loll, MD    PARoxetine (PAXIL) 20 MG tablet Take 1 tablet (20 mg total) by mouth daily. 01/26/16   Gonzella Lex, MD  PARoxetine (PAXIL) 30 MG tablet Take 40 mg by mouth daily.     Historical Provider, MD  ranitidine (CVS RANITIDINE) 75 MG tablet Take by mouth.    Historical Provider, MD  tiZANidine (ZANAFLEX) 4 MG tablet Take 4 mg by mouth every 6 (six) hours as needed for muscle spasms.    Historical Provider, MD  traZODone (DESYREL) 50 MG tablet Take 1 tablet (50 mg total) by mouth at bedtime. 01/25/16   Gonzella Lex, MD  zolpidem (AMBIEN) 10 MG tablet Take 10 mg by mouth at bedtime as needed for sleep.    Historical Provider, MD    Allergies Bee venom; Darvon [propoxyphene]; Dicyclomine; Other; Oxycodone-acetaminophen; Peanuts [peanut oil]; Penicillins; Percocet [oxycodone-acetaminophen]; Sulfa antibiotics; Sulfacetamide sodium; Ultram [tramadol]; Amikacin; Aspirin; Bee pollen; Doxycycline; Haloperidol; Hymenoptera venom preparations; Imipramine; Sulfur; Adhesive [tape]; Hydroxyzine; and Prednisone    Social History Social History  Substance Use Topics  . Smoking status: Former Research scientist (life sciences)  . Smokeless tobacco: Never Used  . Alcohol use No    Review of Systems Patient denies headaches, rhinorrhea, blurry vision, numbness, shortness of breath, chest pain, edema, cough, abdominal pain, nausea, vomiting, diarrhea, dysuria, fevers, rashes or hallucinations unless otherwise stated above in HPI. ____________________________________________   PHYSICAL EXAM:  VITAL SIGNS: Vitals:   05/03/16 1332  BP: (!) 157/71  Pulse: 62  Resp: 17  Temp: 98.1 F (36.7 C)    Constitutional: Alert, in no acute distress. Eyes: Conjunctivae are normal. PERRL. EOMI. Head: Atraumatic. Nose: No congestion/rhinnorhea. Mouth/Throat: Mucous membranes are moist.  Oropharynx non-erythematous. Neck: No stridor. Painless ROM. No cervical spine tenderness to palpation Hematological/Lymphatic/Immunilogical: No cervical  lymphadenopathy. Cardiovascular: Normal rate, regular rhythm. Grossly normal heart sounds.  Good peripheral circulation. Respiratory: Normal respiratory effort.  No retractions. Lungs CTAB. Gastrointestinal: Soft and nontender. No distention. No abdominal bruits. No CVA tenderness. Genitourinary:  Musculoskeletal: No lower extremity tenderness nor edema.  No joint effusions. Neurologic:  Normal speech and language. No gross focal neurologic deficits are appreciated. Skin:  Skin is warm, dry and intact. No rash noted. Psychiatric  Tangential speech, no SI or HI ____________________________________________   LABS (all labs ordered are listed, but only abnormal results are displayed)  Results for orders placed or performed during the hospital encounter of 05/03/16 (from the past 24 hour(s))  Basic metabolic panel     Status: Abnormal   Collection Time: 05/03/16  1:39 PM  Result Value Ref Range   Sodium 133 (L) 135 - 145 mmol/L   Potassium 4.0 3.5 - 5.1 mmol/L   Chloride 98 (L) 101 - 111 mmol/L   CO2 25 22 - 32 mmol/L  Glucose, Bld 89 65 - 99 mg/dL   BUN 7 6 - 20 mg/dL   Creatinine, Ser 0.66 0.44 - 1.00 mg/dL   Calcium 9.5 8.9 - 10.3 mg/dL   GFR calc non Af Amer >60 >60 mL/min   GFR calc Af Amer >60 >60 mL/min   Anion gap 10 5 - 15  CBC     Status: None   Collection Time: 05/03/16  1:39 PM  Result Value Ref Range   WBC 6.9 3.6 - 11.0 K/uL   RBC 4.28 3.80 - 5.20 MIL/uL   Hemoglobin 13.4 12.0 - 16.0 g/dL   HCT 39.2 35.0 - 47.0 %   MCV 91.7 80.0 - 100.0 fL   MCH 31.2 26.0 - 34.0 pg   MCHC 34.1 32.0 - 36.0 g/dL   RDW 13.4 11.5 - 14.5 %   Platelets 340 150 - 440 K/uL  Urinalysis, Complete w Microscopic     Status: Abnormal   Collection Time: 05/03/16  1:40 PM  Result Value Ref Range   Color, Urine YELLOW (A) YELLOW   APPearance CLEAR (A) CLEAR   Specific Gravity, Urine 1.002 (L) 1.005 - 1.030   pH 8.0 5.0 - 8.0   Glucose, UA NEGATIVE NEGATIVE mg/dL   Hgb urine dipstick  NEGATIVE NEGATIVE   Bilirubin Urine NEGATIVE NEGATIVE   Ketones, ur NEGATIVE NEGATIVE mg/dL   Protein, ur NEGATIVE NEGATIVE mg/dL   Nitrite NEGATIVE NEGATIVE   Leukocytes, UA NEGATIVE NEGATIVE   RBC / HPF 0-5 0 - 5 RBC/hpf   WBC, UA 0-5 0 - 5 WBC/hpf   Bacteria, UA NONE SEEN NONE SEEN   Squamous Epithelial / LPF 0-5 (A) NONE SEEN   ____________________________________________  EKG My review and personal interpretation at Time: 13:07   Indication: fainting spell  Rate: 65  Rhythm: sinus Axis: normal Other: normal intervals, no acute ischemia ____________________________________________  RADIOLOGY  I personally reviewed all radiographic images ordered to evaluate for the above acute complaints and reviewed radiology reports and findings.  These findings were personally discussed with the patient.  Please see medical record for radiology report.  ____________________________________________   PROCEDURES  Procedure(s) performed:  Procedures    Critical Care performed: no ____________________________________________   INITIAL IMPRESSION / ASSESSMENT AND PLAN / ED COURSE  Pertinent labs & imaging results that were available during my care of the patient were reviewed by me and considered in my medical decision making (see chart for details).  DDX: dehydration, ich, tia, dysrhythmia  PENDA VENTURI is a 66 y.o. who presents to the ED with fainting spell that appears to been intentional. Blood work is reassuring. CT imaging shows no acute abnormality. Do not feel further diagnostic testing clinically indicated. Patient stable for discharge back to facility.      ____________________________________________   FINAL CLINICAL IMPRESSION(S) / ED DIAGNOSES  Final diagnoses:  Weakness  Fainting spell      NEW MEDICATIONS STARTED DURING THIS VISIT:  New Prescriptions   No medications on file     Note:  This document was prepared using Dragon voice recognition  software and may include unintentional dictation errors.    Merlyn Lot, MD 05/03/16 [phone]

## 2016-05-03 NOTE — ED Triage Notes (Signed)
Pt presents to ED from [facility] c/o "not being able to move any part of my body." EMS report near-syncopal episode today, pt was checked by nursing staff at [facility] who stated all VS were WNL, Staff at [facility] called their MD who gave orders to check STAT bloodwork in facility but pt refused, stated she wanted to be transported to ED instead.

## 2016-05-03 NOTE — ED Notes (Signed)
Removed bed pan from patient

## 2016-05-15 ENCOUNTER — Telehealth: Payer: Self-pay | Admitting: Obstetrics and Gynecology

## 2016-05-15 ENCOUNTER — Ambulatory Visit (INDEPENDENT_AMBULATORY_CARE_PROVIDER_SITE_OTHER): Payer: Medicare Other | Admitting: Obstetrics and Gynecology

## 2016-05-15 VITALS — BP 128/77 | HR 54 | Wt 126.0 lb

## 2016-05-15 DIAGNOSIS — R300 Dysuria: Secondary | ICD-10-CM

## 2016-05-15 DIAGNOSIS — L292 Pruritus vulvae: Secondary | ICD-10-CM | POA: Diagnosis not present

## 2016-05-15 DIAGNOSIS — R319 Hematuria, unspecified: Secondary | ICD-10-CM | POA: Diagnosis not present

## 2016-05-15 MED ORDER — TERCONAZOLE 0.8 % VA CREA: 1.0000 | TOPICAL_CREAM | Freq: Every day | VAGINAL | 0 refills | Status: DC

## 2016-05-15 MED ORDER — TERCONAZOLE 0.8 % VA CREA
1.0000 | TOPICAL_CREAM | Freq: Every day | VAGINAL | 0 refills | Status: DC
Start: 2016-05-15 — End: 2016-07-06

## 2016-05-15 NOTE — Telephone Encounter (Signed)
While checking patient out patent advised she no longer uses Mail order pharmacy and requests the rx for terconazole (TERAZOL 3) 0.8 % vaginal cream Be sent to [facility]. Please Advise.

## 2016-05-15 NOTE — Addendum Note (Signed)
Addended by: Raliegh Ip on: 05/15/2016 12:16 PM   Modules accepted: Orders

## 2016-05-15 NOTE — Progress Notes (Signed)
HPI:      [name] is a 66 y.o. G1P0010 who LMP was No LMP recorded. Patient is postmenopausal.  Subjective:   She presents today Stating that she has a bladder infection with burning with urination and difficulty emptying. She states that she was seen at urgent care and treated on 2 occasions but has had no relief. She reports that when taking the Keflex she stopped it mid course because she felt like she was having lower extremity swelling. She also complains of anal and vulvar itching. She believes she has a yeast infection. She reports that she took Diflucan 2 weeks ago without success. She states that the only drug that has ever worked for her is Banker. She reports that her iron count "must be low"because she has cold hands and feels like she is more pale than usual. She is wearing gloves today in the office. A recent CBC from April is normal. She states that she is concerned no one is taking care of her for her current issues and that she has had to seek care and direct care on her own.     Hx: The following portions of the patient's history were reviewed and updated as appropriate:              She  has a past medical history of Allergic rhinitis; Anemia; Bipolar 1 disorder ([place]); Blurred vision; Bronchitis; Chronic constipation; Chronic sinusitis; Depression; Diverticulitis; Diverticulosis; Dizziness; Endometriosis; Endometriosis; Facial pain; Falls; GERD (gastroesophageal reflux disease); GERD (gastroesophageal reflux disease); Hernia, hiatal; Hypertension; Hypothyroidism; Malignant neoplasm of skin; Migraine; Neuropathy; Otalgia; Otitis media; skin cancer; and Thyroid disease. She  does not have any pertinent problems on file. She  has a past surgical history that includes Tonsillectomy; uteral suspension; Appendectomy; Hernia repair; laproscopy; Cholecystectomy, laparoscopic; Cholecystectomy; and Esophagogastroduodenoscopy (egd) with propofol (N/A, 03/29/2015). Her family  history includes Cancer in her mother; Heart disease in her father. She  reports that she has quit smoking. She has never used smokeless tobacco. She reports that she does not drink alcohol or use drugs. Current Outpatient Prescriptions on File Prior to Visit  Medication Sig Dispense Refill  . amLODipine (NORVASC) 2.5 MG tablet Take 2.5 mg by mouth daily.    . Biotin 5000 MCG CAPS Take 1 capsule by mouth daily.     . calcium carbonate (OS-CAL) 600 MG TABS tablet Take 600 mg by mouth 2 (two) times daily with a meal.     . divalproex (DEPAKOTE) 250 MG DR tablet Take 1 tablet (250 mg total) by mouth at bedtime. 30 tablet 1  . levothyroxine (SYNTHROID, LEVOTHROID) 88 MCG tablet Take 88 mcg by mouth daily before breakfast. Reported on 06/24/2015    . Linaclotide (LINZESS) 290 MCG CAPS capsule Take 290 mcg by mouth daily.    . pantoprazole (PROTONIX) 40 MG tablet Take 1 tablet (40 mg total) by mouth 2 (two) times daily before a meal. 60 tablet 0  . PARoxetine (PAXIL) 20 MG tablet Take 1 tablet (20 mg total) by mouth daily. 30 tablet 1  . PARoxetine (PAXIL) 30 MG tablet Take 40 mg by mouth daily.     . ranitidine ([facility] RANITIDINE) 75 MG tablet Take by mouth.    Marland Kitchen tiZANidine (ZANAFLEX) 4 MG tablet Take 4 mg by mouth every 6 (six) hours as needed for muscle spasms.    . traZODone (DESYREL) 50 MG tablet Take 1 tablet (50 mg total) by mouth at bedtime. 30 tablet 1  .  zolpidem (AMBIEN) 10 MG tablet Take 10 mg by mouth at bedtime as needed for sleep.     No current facility-administered medications on file prior to visit.          Review of Systems:  Review of Systems  Constitutional: Denied constitutional symptoms, night sweats, recent illness, fatigue, fever, insomnia and weight loss.  Eyes: Denied eye symptoms, eye pain, photophobia, vision change and visual disturbance.  Ears/Nose/Throat/Neck: Denied ear, nose, throat or neck symptoms, hearing loss, nasal discharge, sinus congestion and sore throat.   Cardiovascular: Denied cardiovascular symptoms, arrhythmia, chest pain/pressure, edema, exercise intolerance, orthopnea and palpitations.  Respiratory: Denied pulmonary symptoms, asthma, pleuritic pain, productive sputum, cough, dyspnea and wheezing.  Gastrointestinal: Denied, gastro-esophageal reflux, melena, nausea and vomiting.  Genitourinary: See HPI for additional information.  Musculoskeletal: Denied musculoskeletal symptoms, stiffness, swelling, muscle weakness and myalgia.  Dermatologic: Denied dermatology symptoms, rash and scar.  Neurologic: Denied neurology symptoms, dizziness, headache, neck pain and syncope.  Psychiatric: Denied psychiatric symptoms, anxiety and depression.  Endocrine: Denied endocrine symptoms including hot flashes and night sweats.   Meds:   Current Outpatient Prescriptions on File Prior to Visit  Medication Sig Dispense Refill  . amLODipine (NORVASC) 2.5 MG tablet Take 2.5 mg by mouth daily.    . Biotin 5000 MCG CAPS Take 1 capsule by mouth daily.     . calcium carbonate (OS-CAL) 600 MG TABS tablet Take 600 mg by mouth 2 (two) times daily with a meal.     . divalproex (DEPAKOTE) 250 MG DR tablet Take 1 tablet (250 mg total) by mouth at bedtime. 30 tablet 1  . levothyroxine (SYNTHROID, LEVOTHROID) 88 MCG tablet Take 88 mcg by mouth daily before breakfast. Reported on 06/24/2015    . Linaclotide (LINZESS) 290 MCG CAPS capsule Take 290 mcg by mouth daily.    . pantoprazole (PROTONIX) 40 MG tablet Take 1 tablet (40 mg total) by mouth 2 (two) times daily before a meal. 60 tablet 0  . PARoxetine (PAXIL) 20 MG tablet Take 1 tablet (20 mg total) by mouth daily. 30 tablet 1  . PARoxetine (PAXIL) 30 MG tablet Take 40 mg by mouth daily.     . ranitidine ([facility] RANITIDINE) 75 MG tablet Take by mouth.    Marland Kitchen tiZANidine (ZANAFLEX) 4 MG tablet Take 4 mg by mouth every 6 (six) hours as needed for muscle spasms.    . traZODone (DESYREL) 50 MG tablet Take 1 tablet (50 mg total) by  mouth at bedtime. 30 tablet 1  . zolpidem (AMBIEN) 10 MG tablet Take 10 mg by mouth at bedtime as needed for sleep.     No current facility-administered medications on file prior to visit.     Objective:     Vitals:   05/15/16 1054  BP: 128/77  Pulse: (!) 54              Physical examination   Pelvic:   Vulva: Normal appearance.  No lesions.  No erythema   Vagina: No lesions or abnormalities noted.  Atrophic   Support: Normal pelvic support.  Urethra No masses tenderness or scarring.  Small urethral caruncle   Meatus Normal size without lesions or prolapse.  Cervix: Normal appearance.  No lesions.  Anus: Normal exam.  No lesions.  No erythema   Perineum: Normal exam.  No lesions.        Bimanual   Uterus: Normal size.  Non-tender.  Mobile.  AV.  Adnexae: No masses.  Non-tender to  palpation.  Cul-de-sac: Negative for abnormality.   WET PREP: clue cells: absent, KOH (yeast): negative, odor: absent and trichomoniasis: negative Ph:  < 4.5   Assessment:    G1P0010 Patient Active Problem List   Diagnosis Date Noted  . Memory loss 03/30/2016  . Gait abnormality 03/30/2016  . Mood disorder ([place]) 03/30/2016  . Personality disorder in adult 01/24/2016  . Absolute anemia 06/24/2015  . Clinical depression 06/24/2015  . Endometriosis 06/24/2015  . BP (high blood pressure) 06/24/2015  . Headache, migraine 06/24/2015  . Disease of thyroid gland 06/24/2015  . PMB (postmenopausal bleeding) 05/22/2015  . Bipolar 1 disorder ([place]) 04/23/2015  . Anxiety state 04/23/2015  . Colitis, acute 04/20/2015  . Melena 04/20/2015  . Accelerated hypertension 04/20/2015  . TIA (transient ischemic attack) 04/20/2015  . Hypothyroidism 04/20/2015  . GERD (gastroesophageal reflux disease) 04/20/2015  . Chest pain 04/20/2015  . Acute colitis 04/20/2015  . Cervical disc disorder with radiculopathy 03/25/2015  . Cervical pain 03/25/2015  . Pelvic pain in female 10/30/2014  . Bipolar affective  disorder ([place]) 06/30/2014  . CA of skin 06/30/2014  . Cephalalgia 12/16/2013  . Neuropathy 12/16/2013     1. Dysuria   2. Hematuria, unspecified type   3. Vulvar itching     Patient likely has a UTI based on her symptoms and hematuria. Because she has recently been treated with 2 different medications I believe it is warranted to wait for the cultures before retreating. I have discussed this with her in detail.  I doubt that patient has Monilia vulvitis however after being on antibiotics and with her complaint of constant itching I will prescribe Terazol at her request.  Workup for cold hands and possible pale face deferred at this time as recent blood work normal.   Plan:            1.  Urine for culture - we'll contact patient when results return for likely antibiotic course.  2.  Terazol for possible Monilia Orders Orders Placed This Encounter  Procedures  . Urine culture     Meds ordered this encounter  Medications  . DISCONTD: terconazole (TERAZOL 3) 0.8 % vaginal cream    Sig: Place 1 applicator vaginally at bedtime. For 3 days    Dispense:  20 g    Refill:  0  . terconazole (TERAZOL 3) 0.8 % vaginal cream    Sig: Place 1 applicator vaginally at bedtime. For 3 days    Dispense:  20 g    Refill:  0        F/U  Return for We will contact her with any abnormal test results.  Finis Bud, M.D. 05/15/2016 11:49 AM

## 2016-05-15 NOTE — Telephone Encounter (Signed)
Pharmacy updated and script faxed.

## 2016-05-17 LAB — URINE CULTURE

## 2016-05-18 ENCOUNTER — Telehealth: Payer: Self-pay

## 2016-05-18 NOTE — Telephone Encounter (Signed)
Message left on pts voicemail- neg results per provider. 

## 2016-05-18 NOTE — Telephone Encounter (Signed)
-----   Message from Harlin Heys, MD sent at 05/17/2016  9:11 AM EDT ----- All results within normal range.

## 2016-05-19 ENCOUNTER — Telehealth: Payer: Self-pay

## 2016-05-19 NOTE — Telephone Encounter (Signed)
-----   Message from Harlin Heys, MD sent at 05/17/2016  9:11 AM EDT ----- All results within normal range.

## 2016-05-19 NOTE — Telephone Encounter (Signed)
Message left on patients voice mail- neg results per provider

## 2016-05-19 NOTE — Telephone Encounter (Signed)
Patient lvm "Returning [employer] call". Please advise

## 2016-05-22 ENCOUNTER — Telehealth: Payer: Self-pay

## 2016-05-22 NOTE — Telephone Encounter (Signed)
Normal letter sent to patient due to being unable to reach by phone.

## 2016-05-22 NOTE — Telephone Encounter (Signed)
-----   Message from Harlin Heys, MD sent at 05/17/2016  9:11 AM EDT ----- All results within normal range.

## 2016-05-24 DIAGNOSIS — G44309 Post-traumatic headache, unspecified, not intractable: Secondary | ICD-10-CM | POA: Insufficient documentation

## 2016-05-24 DIAGNOSIS — M4712 Other spondylosis with myelopathy, cervical region: Secondary | ICD-10-CM | POA: Insufficient documentation

## 2016-05-29 ENCOUNTER — Ambulatory Visit: Payer: Medicare Other | Admitting: Podiatry

## 2016-05-29 ENCOUNTER — Other Ambulatory Visit: Payer: Self-pay | Admitting: Orthopedic Surgery

## 2016-05-29 DIAGNOSIS — M542 Cervicalgia: Secondary | ICD-10-CM

## 2016-05-29 DIAGNOSIS — M4712 Other spondylosis with myelopathy, cervical region: Secondary | ICD-10-CM

## 2016-05-30 ENCOUNTER — Ambulatory Visit: Payer: Medicare Other | Admitting: Podiatry

## 2016-05-31 ENCOUNTER — Ambulatory Visit (INDEPENDENT_AMBULATORY_CARE_PROVIDER_SITE_OTHER): Payer: Medicare Other | Admitting: Podiatry

## 2016-05-31 ENCOUNTER — Encounter: Payer: Self-pay | Admitting: Podiatry

## 2016-05-31 DIAGNOSIS — M722 Plantar fascial fibromatosis: Secondary | ICD-10-CM | POA: Diagnosis not present

## 2016-05-31 DIAGNOSIS — B351 Tinea unguium: Secondary | ICD-10-CM | POA: Diagnosis not present

## 2016-05-31 DIAGNOSIS — M79609 Pain in unspecified limb: Secondary | ICD-10-CM | POA: Diagnosis not present

## 2016-05-31 DIAGNOSIS — Q828 Other specified congenital malformations of skin: Secondary | ICD-10-CM

## 2016-05-31 NOTE — Progress Notes (Signed)
She presents today chief concern of ankle elongated toenails and calluses bilaterally. She is also complaining of bilateral heel pain. She is also using ciclopirox for onychomycosis provided by her primary care provider.  Objective: Vital signs are stable alert and oriented 3. Pulses are palpable. Severe rigid hammertoe deformities which she would like to have corrected. Mild HAV deformities. She has pain on palpation medial calcaneal tubercle bilateral. Nails elongated thickened dystrophic with mycotic multiple or keratomas plantar aspect bilateral foot.  Assessment: Pain limb secondary to onychomycosis porokeratosis. Severe hammertoe deformities bilateral.  Plan: Discussed proper shoe gear shift use ice ice therapy shoe modifications she was to have her toes surgically corrected. I explained to her that I would not do that but I feel she is doing wrist at following not feeling well. She would like me to refer her to someone to make consider surgical intervention to refer her to Dr. Amalia Hailey.

## 2016-07-06 ENCOUNTER — Ambulatory Visit (INDEPENDENT_AMBULATORY_CARE_PROVIDER_SITE_OTHER): Payer: Medicare Other | Admitting: Obstetrics and Gynecology

## 2016-07-06 ENCOUNTER — Encounter: Payer: Self-pay | Admitting: Obstetrics and Gynecology

## 2016-07-06 VITALS — BP 120/77 | HR 63 | Ht 60.0 in | Wt 128.9 lb

## 2016-07-06 DIAGNOSIS — Z7189 Other specified counseling: Secondary | ICD-10-CM

## 2016-07-06 DIAGNOSIS — R399 Unspecified symptoms and signs involving the genitourinary system: Secondary | ICD-10-CM | POA: Diagnosis not present

## 2016-07-06 DIAGNOSIS — D509 Iron deficiency anemia, unspecified: Secondary | ICD-10-CM | POA: Diagnosis not present

## 2016-07-06 LAB — POCT URINALYSIS DIPSTICK
Bilirubin, UA: NEGATIVE
GLUCOSE UA: NEGATIVE
Ketones, UA: NEGATIVE
Leukocytes, UA: NEGATIVE
NITRITE UA: NEGATIVE
PROTEIN UA: NEGATIVE
RBC UA: NEGATIVE
Spec Grav, UA: <=1.005 — AB (ref 1.010–1.025)
UROBILINOGEN UA: 0.2 U/dL
pH, UA: 7 (ref 5.0–8.0)

## 2016-07-06 MED ORDER — FERROUS SULFATE 325 (65 FE) MG PO TABS: 325.0000 mg | ORAL_TABLET | Freq: Every day | ORAL | 1 refills | Status: DC

## 2016-07-06 MED ORDER — BOOST PLUS PO LIQD: 237.0000 mL | ORAL | 4 refills | Status: DC

## 2016-07-06 MED ORDER — ESTROGENS, CONJUGATED 0.625 MG/GM VA CREA: 1.0000 | TOPICAL_CREAM | Freq: Every day | VAGINAL | 12 refills | Status: DC

## 2016-07-06 MED ORDER — ESTRADIOL 2 MG VA RING: 2.0000 mg | VAGINAL_RING | VAGINAL | 12 refills | Status: DC

## 2016-07-08 LAB — URINE CULTURE

## 2016-07-09 NOTE — Progress Notes (Signed)
GYNECOLOGY PROGRESS NOTE  Subjective:    Patient ID: Angela [name], female    DOB: June 16, 1950, 66 y.o.   MRN: [id number]  HPI  Patient is a 66 y.o. G65P0010 female who presents for multiple complaints today:  1.  Patient notes that she is not quite feeling herself.  States that she is grieving the loss of her father, which occurred last month.  Feels alone, as she also lost her boyfriend within the past year.  Does not have any other family, and has very few friends in the area. Is now living in a nursing facility, and notes that she feels she has lost her independence.  Is starting to feel to depressed.   2.  Patient notes that she has had recent labs done by her facility, is concerned that they are not managing her properly.  Notes that she noted her Hgb was low, and has had some pallor. Also questions other labs that were drawn.   3. Complains of lack of an appetite due to the meals prepared at her nursing facility.  Does not feel that she is getting proper nutrition.  Notes that she has used Boost in the past (lactose intolerance type).  4. Desires to change nursing facilities, states that she would like to transition to a different home, preferably in [place].   5. Patient complains of vaginal irritation.  States that she was using her Premarin cream but has run out of her prescription.    The following portions of the patient's history were reviewed and updated as appropriate: allergies, current medications, past family history, past medical history, past social history, past surgical history and problem list.  Review of Systems Pertinent items noted in HPI and remainder of comprehensive ROS otherwise negative.   Objective:   Blood pressure 120/77, pulse 63, height 5' (1.524 m), weight 128 lb 14.4 oz (58.5 kg), SpO2 98 %. General appearance: alert, fatigued and no distress Abdomen: soft, non-tender; bowel sounds normal; no masses,  no organomegaly Pelvic: external genitalia  normal, no lesions. Moderate vaginal atrophy present, no vaginal discharge.  Introitus narrow. Urethra also with atrophy present. Cervix without lesions, discharge, or tenderness. Uterus normal size, shape, mobile, non-tender.  Extremities: extremities normal, atraumatic, no cyanosis or edema Neurologic: Grossly normal   Assessment:   Grief reaction, possibly with depression H/o bipolar disorder Decreased appetite Anemia (mild) Vaginal atrophy  Plan:   - Grief reaction, with possible depression.  Discussed options for management, including psychotherapy, medications.  Patient currently on Paxil.  Can discuss with her primary care to readjust medications. Will refer to to Psychologist.   - Decreased appetite (patient notes she does not have an appetite,and also does not enjoy the meals at the nursing facility).  Discussed use of Boost as supplementation as patient also c/o protein in urine (as reviewed by labs) and feels she needs to increase her protein intake. Will prescribe 1 daily.  - Anemia, mild.  Patient requests iron supplementation.  Given samples of Ferralate to take.  Prescription given as well, for 3 months, once daily.  - Discussed with patient that if she desired to transfer facilities, she would need to discuss with her PCP or her caregiver/health manager regarding the transfer.  Patient notes that she is currently not happy with her PCP, and would like to find a new one.  Will place referral for a new PCP.  -Vaginal atrophy, patient out of vaginal cream, desires refill.  Notes that she has been  thinking about changing her estrogen therapy, would like to try the vaginal ring.  Will prescribe vaginal ring, to return to have it placed in 2-3 weeks.  To continue use of estrogen cream externally as introitus noted to be slightly narrow, and urethra with modest atrophy.    A total of 40 minutes were spent face-to-face with the patient during the encounter and over half of that time  involved counseling and coordination of care.

## 2016-07-11 ENCOUNTER — Ambulatory Visit: Payer: Medicare Other | Admitting: Podiatry

## 2016-07-14 ENCOUNTER — Encounter: Payer: Self-pay | Admitting: Emergency Medicine

## 2016-07-14 ENCOUNTER — Emergency Department
Admission: EM | Admit: 2016-07-14 | Discharge: 2016-07-14 | Disposition: A | Discharge status: Home / Self Care | Payer: Medicare Other | Attending: Emergency Medicine | Admitting: Emergency Medicine

## 2016-07-14 DIAGNOSIS — E039 Hypothyroidism, unspecified: Secondary | ICD-10-CM | POA: Insufficient documentation

## 2016-07-14 DIAGNOSIS — Z8673 Personal history of transient ischemic attack (TIA), and cerebral infarction without residual deficits: Secondary | ICD-10-CM | POA: Diagnosis not present

## 2016-07-14 DIAGNOSIS — J019 Acute sinusitis, unspecified: Secondary | ICD-10-CM | POA: Diagnosis not present

## 2016-07-14 DIAGNOSIS — Z91010 Allergy to peanuts: Secondary | ICD-10-CM | POA: Diagnosis not present

## 2016-07-14 DIAGNOSIS — Z87891 Personal history of nicotine dependence: Secondary | ICD-10-CM | POA: Diagnosis not present

## 2016-07-14 DIAGNOSIS — I10 Essential (primary) hypertension: Secondary | ICD-10-CM | POA: Diagnosis not present

## 2016-07-14 DIAGNOSIS — J329 Chronic sinusitis, unspecified: Secondary | ICD-10-CM

## 2016-07-14 DIAGNOSIS — Z79899 Other long term (current) drug therapy: Secondary | ICD-10-CM | POA: Insufficient documentation

## 2016-07-14 DIAGNOSIS — Z85828 Personal history of other malignant neoplasm of skin: Secondary | ICD-10-CM | POA: Insufficient documentation

## 2016-07-14 DIAGNOSIS — R093 Abnormal sputum: Secondary | ICD-10-CM | POA: Diagnosis present

## 2016-07-14 LAB — BASIC METABOLIC PANEL
Anion gap: 9 (ref 5–15)
BUN: 8 mg/dL (ref 6–20)
CHLORIDE: 99 mmol/L — AB (ref 101–111)
CO2: 26 mmol/L (ref 22–32)
CREATININE: 0.78 mg/dL (ref 0.44–1.00)
Calcium: 9.2 mg/dL (ref 8.9–10.3)
GFR calc non Af Amer: >60 mL/min (ref >60)
GLUCOSE: 113 mg/dL — AB (ref 65–99)
Potassium: 4.3 mmol/L (ref 3.5–5.1)
Sodium: 134 mmol/L — ABNORMAL LOW (ref 135–145)

## 2016-07-14 LAB — CBC WITH DIFFERENTIAL/PLATELET
BASOS PCT: 1 %
Basophils Absolute: 0 10*3/uL (ref 0–0.1)
Eosinophils Absolute: 0.1 10*3/uL (ref 0–0.7)
Eosinophils Relative: 2 %
HEMATOCRIT: 36.6 % (ref 35.0–47.0)
HEMOGLOBIN: 12.6 g/dL (ref 12.0–16.0)
LYMPHS ABS: 1.6 10*3/uL (ref 1.0–3.6)
LYMPHS PCT: 28 %
MCH: 31.5 pg (ref 26.0–34.0)
MCHC: 34.4 g/dL (ref 32.0–36.0)
MCV: 91.5 fL (ref 80.0–100.0)
MONO ABS: 0.6 10*3/uL (ref 0.2–0.9)
MONOS PCT: 11 %
NEUTROS ABS: 3.3 10*3/uL (ref 1.4–6.5)
NEUTROS PCT: 58 %
Platelets: 314 10*3/uL (ref 150–440)
RBC: 3.99 MIL/uL (ref 3.80–5.20)
RDW: 13.6 % (ref 11.5–14.5)
WBC: 5.6 10*3/uL (ref 3.6–11.0)

## 2016-07-14 LAB — URINALYSIS, COMPLETE (UACMP) WITH MICROSCOPIC
BACTERIA UA: NONE SEEN
BILIRUBIN URINE: NEGATIVE
Glucose, UA: NEGATIVE mg/dL
Hgb urine dipstick: NEGATIVE
KETONES UR: NEGATIVE mg/dL
LEUKOCYTES UA: NEGATIVE
Nitrite: NEGATIVE
Protein, ur: NEGATIVE mg/dL
RBC / HPF: NONE SEEN RBC/hpf (ref 0–5)
SPECIFIC GRAVITY, URINE: 1.004 — AB (ref 1.005–1.030)
SQUAMOUS EPITHELIAL / LPF: NONE SEEN
pH: 7 (ref 5.0–8.0)

## 2016-07-14 MED ORDER — LEVOFLOXACIN 250 MG PO TABS: 250.0000 mg | ORAL_TABLET | Freq: Every day | ORAL | 0 refills | Status: AC

## 2016-07-14 MED ORDER — FLUCONAZOLE 150 MG PO TABS: 150.0000 mg | ORAL_TABLET | Freq: Once | ORAL | 1 refills | Status: AC

## 2016-07-14 NOTE — ED Provider Notes (Signed)
[facility] Emergency Department Provider Note   ____________________________________________   I have reviewed the triage vital signs and the nursing notes.   HISTORY  Chief Complaint Weakness   History limited by: Not Limited   HPI Angela [name] is a 66 y.o. female who presents to the emergency department today with multiple medical complaints. She states that she has been at [facility] for months without seen a doctor. Her primary complaint seems to be concerned for possible sinusitis. She states she has been having a lot of green mucus and phlegm. She has been having some discomfort. This is been going on for about 3 weeks. She states that she has had some sore throat and cough with a toothache since due to postnasal drip. She also had some concerns because of abnormal blood work was was checked last month.   Past Medical History:  Diagnosis Date  . Allergic rhinitis   . Anemia   . Bipolar 1 disorder ([place])   . Blurred vision   . Bronchitis   . Chronic constipation   . Chronic sinusitis   . Depression   . Diverticulitis   . Diverticulosis   . Dizziness   . Endometriosis   . Endometriosis   . Facial pain   . Falls   . GERD (gastroesophageal reflux disease)   . GERD (gastroesophageal reflux disease)   . Hernia, hiatal   . Hypertension   . Hypothyroidism   . Malignant neoplasm of skin   . Migraine   . Neuropathy   . Otalgia   . Otitis media   . skin cancer   . Thyroid disease     Patient Active Problem List   Diagnosis Date Noted  . Memory loss 03/30/2016  . Gait abnormality 03/30/2016  . Mood disorder ([place]) 03/30/2016  . Personality disorder in adult 01/24/2016  . Absolute anemia 06/24/2015  . Clinical depression 06/24/2015  . Endometriosis 06/24/2015  . BP (high blood pressure) 06/24/2015  . Headache, migraine 06/24/2015  . Disease of thyroid gland 06/24/2015  . PMB (postmenopausal bleeding) 05/22/2015  . Bipolar 1  disorder ([place]) 04/23/2015  . Anxiety state 04/23/2015  . Colitis, acute 04/20/2015  . Melena 04/20/2015  . Accelerated hypertension 04/20/2015  . TIA (transient ischemic attack) 04/20/2015  . Hypothyroidism 04/20/2015  . GERD (gastroesophageal reflux disease) 04/20/2015  . Chest pain 04/20/2015  . Acute colitis 04/20/2015  . Cervical disc disorder with radiculopathy 03/25/2015  . Cervical pain 03/25/2015  . Pelvic pain in female 10/30/2014  . Bipolar affective disorder ([place]) 06/30/2014  . CA of skin 06/30/2014  . Cephalalgia 12/16/2013  . Neuropathy 12/16/2013    Past Surgical History:  Procedure Laterality Date  . APPENDECTOMY    . CHOLECYSTECTOMY    . CHOLECYSTECTOMY, LAPAROSCOPIC    . ESOPHAGOGASTRODUODENOSCOPY (EGD) WITH PROPOFOL N/A 03/29/2015   Procedure: ESOPHAGOGASTRODUODENOSCOPY (EGD) WITH PROPOFOL;  Surgeon: Josefine Class, MD;  Location: [facility] ENDOSCOPY;  Service: Endoscopy;  Laterality: N/A;  . HERNIA REPAIR    . laproscopy    . TONSILLECTOMY    . uteral suspension      Prior to Admission medications   Medication Sig Start Date End Date Taking? Authorizing Provider  amLODipine (NORVASC) 2.5 MG tablet Take 2.5 mg by mouth daily.    [provider]  Biotin 5000 MCG CAPS Take 1 capsule by mouth daily.     [provider]  calcium carbonate (OS-CAL) 600 MG TABS tablet Take 600 mg by mouth  2 (two) times daily with a meal.     [provider]  conjugated estrogens (PREMARIN) vaginal cream Place 1 Applicatorful vaginally daily. 07/06/16   Rubie Maid, MD  divalproex (DEPAKOTE) 250 MG DR tablet Take 1 tablet (250 mg total) by mouth at bedtime. 01/25/16   Clapacs, Madie Reno, MD  estradiol (ESTRING) 2 MG vaginal ring Place 2 mg vaginally every 3 (three) months. follow package directions 07/06/16   Rubie Maid, MD  ferrous sulfate (FERROUSUL) 325 (65 FE) MG tablet Take 1 tablet (325 mg total) by mouth daily with breakfast. 07/06/16   Rubie Maid,  MD  lactose free nutrition (BOOST PLUS) LIQD Take 237 mLs by mouth daily. 07/06/16   Rubie Maid, MD  levothyroxine (SYNTHROID, LEVOTHROID) 88 MCG tablet Take 88 mcg by mouth daily before breakfast. Reported on 06/24/2015    [provider]  Linaclotide (LINZESS) 290 MCG CAPS capsule Take 290 mcg by mouth daily.    [provider]  pantoprazole (PROTONIX) 40 MG tablet Take 1 tablet (40 mg total) by mouth 2 (two) times daily before a meal. 04/26/15   Demetrios Loll, MD  PARoxetine (PAXIL) 20 MG tablet Take 1 tablet (20 mg total) by mouth daily. 01/26/16   Clapacs, Madie Reno, MD  PARoxetine (PAXIL) 30 MG tablet Take 40 mg by mouth daily.     [provider]  ranitidine ([employer] RANITIDINE) 75 MG tablet Take by mouth.    [provider]  tiZANidine (ZANAFLEX) 4 MG tablet Take 4 mg by mouth every 6 (six) hours as needed for muscle spasms.    [provider]  traZODone (DESYREL) 50 MG tablet Take 1 tablet (50 mg total) by mouth at bedtime. 01/25/16   Clapacs, Madie Reno, MD  zolpidem (AMBIEN) 10 MG tablet Take 10 mg by mouth at bedtime as needed for sleep.    [provider]    Allergies Bee venom; Darvon [propoxyphene]; Dicyclomine; Other; Oxycodone-acetaminophen; Peanuts [peanut oil]; Penicillins; Percocet [oxycodone-acetaminophen]; Sulfa antibiotics; Sulfacetamide sodium; Ultram [tramadol]; Amikacin; Aspirin; Bee pollen; Doxycycline; Haloperidol; Hymenoptera venom preparations; Imipramine; Sulfur; Adhesive [tape]; Hydroxyzine; and Prednisone  Family History  Problem Relation Age of Onset  . Heart disease Father   . Cancer Mother        lung  . Urolithiasis Neg Hx   . Kidney disease Neg Hx   . Kidney cancer Neg Hx   . Prostate cancer Neg Hx     Social History Social History  Substance Use Topics  . Smoking status: Former Research scientist (life sciences)  . Smokeless tobacco: Never Used  . Alcohol use No    Review of Systems Constitutional: Positive for fever Eyes: Positive  for blurry vision ENT: Positive for sore throat. Cardiovascular: Positive for chest pain. Respiratory: Positive for cough. Gastrointestinal: Positive for diarrhea. Genitourinary: Positive for dysuria. Musculoskeletal: Negative for back pain. Skin: Positive for facial rash Neurological: Positive for headache.  ____________________________________________   PHYSICAL EXAM:  VITAL SIGNS: ED Triage Vitals  Enc Vitals Group     BP 07/14/16 1949 (!) 152/77     Pulse Rate 07/14/16 1949 61     Resp 07/14/16 1949 20     Temp 07/14/16 1949 98.7 F (37.1 C)     Temp Source 07/14/16 1949 Oral     SpO2 07/14/16 1949 96 %     Weight 07/14/16 1945 125 lb (56.7 kg)     Height 07/14/16 1945 5\' 3"  (1.6 m)     Head Circumference --  Peak Flow --      Pain Score 07/14/16 1940 8   Constitutional: Alert and oriented. Well appearing and in no distress. Eyes: Conjunctivae are normal.  ENT   Head: Normocephalic and atraumatic.   Nose: No congestion/rhinnorhea.   Mouth/Throat: Mucous membranes are moist.   Neck: No stridor. Hematological/Lymphatic/Immunilogical: No cervical lymphadenopathy. Cardiovascular: Normal rate, regular rhythm.  No murmurs, rubs, or gallops.  Respiratory: Normal respiratory effort without tachypnea nor retractions. Breath sounds are clear and equal bilaterally. No wheezes/rales/rhonchi. Gastrointestinal: Soft and non tender. No rebound. No guarding.  Genitourinary: Deferred Musculoskeletal: Normal range of motion in all extremities. No lower extremity edema. Neurologic:  Normal speech and language. No gross focal neurologic deficits are appreciated.  Skin:  Skin is warm, dry and intact. No rash noted. Psychiatric: Mood and affect are normal. Speech and behavior are normal. Patient exhibits appropriate insight and judgment.  ____________________________________________    LABS (pertinent positives/negatives)  Labs Reviewed  BASIC METABOLIC PANEL -  Abnormal; Notable for the following:       Result Value   Sodium 134 (*)    Chloride 99 (*)    Glucose, Bld 113 (*)    All other components within normal limits  CBC WITH DIFFERENTIAL/PLATELET  URINALYSIS, COMPLETE (UACMP) WITH MICROSCOPIC     ____________________________________________   EKG  None  ____________________________________________    RADIOLOGY  None   ____________________________________________   PROCEDURES  Procedures  ____________________________________________   INITIAL IMPRESSION / ASSESSMENT AND PLAN / ED COURSE  Pertinent labs & imaging results that were available during my care of the patient were reviewed by me and considered in my medical decision making (see chart for details).  Patient presented to emergency department today with multiple medical complaints. Blood work here very unremarkable slightly decreased sodium and chloride. Urine was pending at time of sign out.  ____________________________________________   FINAL CLINICAL IMPRESSION(S) / ED DIAGNOSES  Final diagnoses:  Sinusitis, unspecified chronicity, unspecified location     Note: This dictation was prepared with Dragon dictation. Any transcriptional errors that result from this process are unintentional      Nance Pear, MD 07/15/16 [phone]

## 2016-07-14 NOTE — ED Notes (Signed)
Pt assisted to ambulate to the commode and back to the stretcher; pt displays a steady, even gait.

## 2016-07-14 NOTE — ED Triage Notes (Signed)
Pt comes into the ED via [facility] from [facility] c/o weakness.  Patient denies any chest pain, shortness of breath, or dizziness.  Patient alert and oriented at this time with even and unlabored respirations.  Patient c/o eye pain and sinus issues for 3 weeks.

## 2016-07-14 NOTE — ED Notes (Signed)

## 2016-07-14 NOTE — Discharge Instructions (Signed)
Please seek medical attention for any high fevers, chest pain, shortness of breath, change in behavior, persistent vomiting, bloody stool or any other new or concerning symptoms.  

## 2016-07-14 NOTE — ED Provider Notes (Signed)
Patient received in sign-out from Dr. Archie Balboa.  Workup and evaluation pending UA.  Urinalysis shows no evidence of infection. Patient remains human dynamically stable. Appropriate for f/u with PCP.       Merlyn Lot, MD 07/14/16 2235

## 2016-07-18 ENCOUNTER — Telehealth: Payer: Self-pay | Admitting: Obstetrics and Gynecology

## 2016-07-18 DIAGNOSIS — B3731 Acute candidiasis of vulva and vagina: Secondary | ICD-10-CM

## 2016-07-18 DIAGNOSIS — D508 Other iron deficiency anemias: Secondary | ICD-10-CM

## 2016-07-18 DIAGNOSIS — N952 Postmenopausal atrophic vaginitis: Secondary | ICD-10-CM

## 2016-07-18 DIAGNOSIS — B373 Candidiasis of vulva and vagina: Secondary | ICD-10-CM

## 2016-07-18 NOTE — Telephone Encounter (Signed)
Patient called and requested her scripts be faxed to her assisted living facility [phone]. She also states she has a yeast infection and needs a script for diflucan. She asked that the scripts be faxed to [facility] because the facility looses her faxes.Thanks

## 2016-07-20 ENCOUNTER — Other Ambulatory Visit: Payer: Self-pay

## 2016-07-20 DIAGNOSIS — B3731 Acute candidiasis of vulva and vagina: Secondary | ICD-10-CM

## 2016-07-20 DIAGNOSIS — B373 Candidiasis of vulva and vagina: Secondary | ICD-10-CM

## 2016-07-20 MED ORDER — FLUCONAZOLE 150 MG PO TABS: 150.0000 mg | ORAL_TABLET | Freq: Once | ORAL | 0 refills | Status: DC

## 2016-07-20 MED ORDER — ESTRADIOL 2 MG VA RING
2.0000 mg | VAGINAL_RING | VAGINAL | 12 refills | Status: DC
Start: 2016-07-20 — End: 2016-11-17

## 2016-07-20 MED ORDER — FERROUS SULFATE 325 (65 FE) MG PO TABS: 325.0000 mg | ORAL_TABLET | Freq: Every day | ORAL | 1 refills | Status: DC

## 2016-07-20 MED ORDER — BOOST PLUS PO LIQD: 237.0000 mL | ORAL | 4 refills | Status: DC

## 2016-07-20 MED ORDER — ESTROGENS, CONJUGATED 0.625 MG/GM VA CREA: 1.0000 | TOPICAL_CREAM | Freq: Every day | VAGINAL | 12 refills | Status: DC

## 2016-07-20 MED ORDER — FLUCONAZOLE 150 MG PO TABS: 150.0000 mg | ORAL_TABLET | Freq: Once | ORAL | 0 refills | Status: AC

## 2016-07-20 NOTE — Telephone Encounter (Signed)
Done RX faxed

## 2016-07-21 ENCOUNTER — Telehealth: Payer: Self-pay | Admitting: Obstetrics and Gynecology

## 2016-07-21 NOTE — Telephone Encounter (Signed)
Patient called wanting to know "why she is not able to pick up her medication and also that the medication or pharmacy is discontinued" I was not able to get a clear reason of why she called. Patient also stated that she no longer wants her medication faxed. She no longer has "a specific pharmacy" Patient was in the middle of speaking on another topic and hung up the phone. Patient did not disclose any other information. Please advise.

## 2016-07-25 NOTE — Telephone Encounter (Signed)
Prescriptions already faxed.

## 2016-08-08 ENCOUNTER — Ambulatory Visit (INDEPENDENT_AMBULATORY_CARE_PROVIDER_SITE_OTHER): Payer: Medicare Other | Admitting: Podiatry

## 2016-08-08 DIAGNOSIS — M2042 Other hammer toe(s) (acquired), left foot: Secondary | ICD-10-CM

## 2016-08-08 DIAGNOSIS — B351 Tinea unguium: Secondary | ICD-10-CM

## 2016-08-08 DIAGNOSIS — M79676 Pain in unspecified toe(s): Secondary | ICD-10-CM

## 2016-08-08 DIAGNOSIS — M722 Plantar fascial fibromatosis: Secondary | ICD-10-CM

## 2016-08-08 DIAGNOSIS — M2041 Other hammer toe(s) (acquired), right foot: Secondary | ICD-10-CM

## 2016-08-08 DIAGNOSIS — M659 Synovitis and tenosynovitis, unspecified: Secondary | ICD-10-CM

## 2016-08-08 MED ORDER — BETAMETHASONE SOD PHOS & ACET 6 (3-3) MG/ML IJ SUSP: 3.0000 mg | Freq: Once | INTRAMUSCULAR | Status: DC

## 2016-08-08 NOTE — Progress Notes (Signed)
   HPI: 66 year old female presents to the office today for multiple complaints to the bilateral foot and ankle structures of the foot. Patient states that she's been seen in the past by Dr. Milinda Pointer on several occasions however she was frustrated with the nonprogressive nature of her symptoms and patient was referred to me by Dr. Milinda Pointer for reevaluation and treatment. Patient has complaint of bilateral ankle pain as well as heel pain and also fungal nails to the bilateral great toes more significantly on the right great toe. Patient has had 2 doses of oral antifungal medication which have not helped to alleviate symptoms as well as topical.   Physical Exam: General: The patient is alert and oriented x3 in no acute distress.  Dermatology: Hyperkeratotic dystrophic nail noted to the right great toenail. Skin is warm, dry and supple bilateral lower extremities. Negative for open lesions or macerations.  Vascular: Palpable pedal pulses bilaterally. No edema or erythema noted. Capillary refill within normal limits.  Neurological: Epicritic and protective threshold grossly intact bilaterally.   Musculoskeletal Exam: Hammertoe contracture deformity digits 2-5 of the bilateral feet noted. Range of motion within normal limits to all pedal and ankle joints bilateral. Muscle strength 5/5 in all groups bilateral. There is also some pain on palpation to the anterior medial and lateral aspects the patient's bilateral ankle as well as an on palpation to the medial calcaneal tubercle the bilateral heels consistent with plantar fasciitis.  Assessment: 1. Ankle pain with synovitis bilateral 2. Plantar fasciitis bilateral 3. Onychodystrophy nails 1-5 bilateral 4. Hammertoe deformity contracture digits 2-5 bilateral  Plan of Care:  1. Patient was evaluated. 2. Regarding the dystrophic nails are did talk about different treatment modalities including home remedies of Tea tree oil, Vicks VapoRub, and apple cider  vinegar soaks. I explained to the patient there is no literature supporting these however they are home remedies. Passive discussed laser treatment which the patient states she cannot afford. 3. Mechanical debridement of nails 1-5 bilaterally performed using a nail nipper without incident or bleeding at patient's request. 4. Injection of 0.5 mL Celestone Soluspan injected in the patient's bilateral ankle joints 5. Injection of 0.5 mL Celestone Soluspan injected in the patient's bilateral plantar fascial heels. Plantar fascial braces were dispensed today 6. Silicone toe caps were dispensed to protect the hammertoe deformities from rubbing from her shoes. 7. Recommend good supportive shoe gear. Return to clinic in 6 weeks for reevaluation and further treatment   Edrick Kins, DPM [facility]  Dr. Edrick Kins, DPM    [address], [place] [address]                Office [phone]  Fax [phone]

## 2016-08-15 ENCOUNTER — Telehealth: Payer: Self-pay | Admitting: Podiatry

## 2016-08-15 NOTE — Telephone Encounter (Signed)
I'm calling wanting to know why I have an appointment with Dr. Amalia Hailey on 14 August when I just saw him on 08 August 2016. Also, I need to make an exchange of something at the office. Would be alright to drop by anytime at the [place] office or do I have to wait for Dr. Amalia Hailey to be in the office? Please call me back at [phone]. Thank you for handling this as promptly as you can.

## 2016-08-29 ENCOUNTER — Encounter: Payer: Self-pay | Admitting: Obstetrics and Gynecology

## 2016-08-29 ENCOUNTER — Ambulatory Visit (INDEPENDENT_AMBULATORY_CARE_PROVIDER_SITE_OTHER): Payer: Medicare Other | Admitting: Obstetrics and Gynecology

## 2016-08-29 ENCOUNTER — Ambulatory Visit: Payer: Medicare Other | Admitting: Podiatry

## 2016-08-29 VITALS — BP 104/66 | HR 65 | Ht 63.0 in | Wt 130.0 lb

## 2016-08-29 DIAGNOSIS — N952 Postmenopausal atrophic vaginitis: Secondary | ICD-10-CM | POA: Diagnosis not present

## 2016-08-29 DIAGNOSIS — N6452 Nipple discharge: Secondary | ICD-10-CM

## 2016-08-29 DIAGNOSIS — Z1231 Encounter for screening mammogram for malignant neoplasm of breast: Secondary | ICD-10-CM

## 2016-08-29 DIAGNOSIS — N644 Mastodynia: Secondary | ICD-10-CM

## 2016-08-29 NOTE — Patient Instructions (Signed)
Breast Tenderness Breast tenderness is a common problem for women of all ages. Breast tenderness may cause mild discomfort to severe pain. The pain usually comes and goes in association with your menstrual cycle, but it can be constant. Breast tenderness has many possible causes, including hormone changes and some medicines. Your health care provider may order tests, such as a mammogram or an ultrasound, to check for any unusual findings. Having breast tenderness usually does not mean that you have breast cancer. Follow these instructions at home: Sometimes, reassurance that you do not have breast cancer is all that is needed. In general, follow these home care instructions: Managing pain and discomfort  If directed, apply ice to the area: ? Put ice in a plastic bag. ? Place a towel between your skin and the bag. ? Leave the ice on for 20 minutes, 2-3 times a day.  Make sure you are wearing a supportive bra, especially during exercise. You may also want to wear a supportive bra while sleeping if your breasts are very tender. Medicines  Take over-the-counter and prescription medicines only as told by your health care provider. If the cause of your pain is infection, you may be prescribed an antibiotic medicine.  If you were prescribed an antibiotic, take it as told by your health care provider. Do not stop taking the antibiotic even if you start to feel better. General instructions  Your health care provider may recommend that you reduce the amount of fat in your diet. You can do this by: ? Limiting fried foods. ? Cooking foods using methods, such as baking, boiling, grilling, and broiling.  Decrease the amount of caffeine in your diet. You can do this by drinking more water and choosing caffeine-free options.  Keep a log of the days and times when your breasts are most tender.  Ask your health care provider how to do breast exams at home. This will help you notice if you have an unusual  growth or lump. Contact a health care provider if:  Any part of your breast is hard, red, and hot to the touch. This may be a sign of infection.  You are not breastfeeding and you have fluid, especially blood or pus, coming out of your nipples.  You have a fever.  You have a new or painful lump in your breast that remains after your menstrual period ends.  Your pain does not improve or it gets worse.  Your pain is interfering with your daily activities. This information is not intended to replace advice given to you by your health care provider. Make sure you discuss any questions you have with your health care provider. Document Released: 12/16/2007 Document Revised: 10/01/2015 Document Reviewed: 10/01/2015 Elsevier Interactive Patient Education  2018 Elsevier Inc.  

## 2016-08-30 NOTE — Progress Notes (Signed)
    GYNECOLOGY PROGRESS NOTE  Subjective:    Patient ID: Angela [name], female    DOB: 08/14/1950, 67 y.o.   MRN: [id number]  HPI  Patient is a 66 y.o. [id number] female who presents for complaints of bilateral breast tenderness.  Patient states that this has probably been ongoing for at least 1 month, but had been ignoring it as she thought she had been over-exerting herself with physical activity.  States that she also started noting some nipple discharge. Was seen at Urgent Care last week, and states that she was diagnosed with mastitis and was started on Cipro (due to substantial allergy list).  Patient notes that she does not feel like the medication is helping.  Has been trying cold compresses  and taking OTC pain meds without much relief.  Cannot recall when last mammogram was performed.   Of note, patient notes that she has changed nursing homes, and that her previous home will not send her medications to her new home's pharmacy (icluding her newly prescribed Estring).  Notes that they filled the prescription but never gave it to her, and will not release it to her new pharmacy. Is unable to get another filled for 2 months.    The following portions of the patient's history were reviewed and updated as appropriate: allergies, current medications, past family history, past medical history, past social history, past surgical history and problem list.  Review of Systems Pertinent items noted in HPI and remainder of comprehensive ROS otherwise negative.   Objective:   Blood pressure 104/66, pulse 65, height 5\' 3"  (1.6 m), weight 130 lb (59 kg). General appearance: alert and no distress Breasts: breasts appear normal, no suspicious masses, no skin or nipple changes or axillary nodes.  Very scant clear nipple discharge expressed from left breast.  Tenderness to palpation bilaterally.   Assessment:   Breast tenderness Nipple discharge Vaginal atrophy  Plan:   Mastalgia (without  mastitis) present on exam today.  Patient desires culture to be done on nipple discharge.  Discussed that due to the scant amount, clear nature, and current use of antibiotics, it would likely not be significant for anything. Patient still desires culture to be run.  Will send. Advised that mastalgia is usually self-limiting.  Advised on dietary precautions, continuing cold compresses and OTC meds as needed.  -Ordered mammogram.  Patient very hesitant to get mammogram as she notes it will hurt, but discussed rationale for testing, including ruling out malignancy. Patient states she will get the mammogram after her breasts stop hurting.  - Will give patient samples of Premarin cream until she can receive her next prescription of Estring.  - Patient also desires to know her vitamin levels.  States that several were low in the past, as well as her iron. Also desires to know her thyroid status.  Would like levels rechecked. Labs ordered.    To f/u if symptoms persist or do not resolve.    Rubie Maid, MD [facility]

## 2016-09-03 LAB — ANAEROBIC AND AEROBIC CULTURE

## 2016-09-04 ENCOUNTER — Telehealth: Payer: Self-pay

## 2016-09-04 NOTE — Telephone Encounter (Signed)
-----   Message from Rubie Maid, MD sent at 09/03/2016  6:10 PM EDT ----- Please inform patient that her culture grew back with normal skin flora (bacteria), no evidence of infection.

## 2016-09-04 NOTE — Telephone Encounter (Signed)
Called pt informed her of negative swab. Pt gave verbal understanding.

## 2016-09-06 ENCOUNTER — Emergency Department
Admission: EM | Admit: 2016-09-06 | Discharge: 2016-09-06 | Disposition: A | Discharge status: Home / Self Care | Payer: Medicare Other | Attending: Emergency Medicine | Admitting: Emergency Medicine

## 2016-09-06 ENCOUNTER — Emergency Department: Payer: Medicare Other

## 2016-09-06 DIAGNOSIS — E039 Hypothyroidism, unspecified: Secondary | ICD-10-CM | POA: Diagnosis not present

## 2016-09-06 DIAGNOSIS — Z87891 Personal history of nicotine dependence: Secondary | ICD-10-CM | POA: Insufficient documentation

## 2016-09-06 DIAGNOSIS — Z91010 Allergy to peanuts: Secondary | ICD-10-CM | POA: Insufficient documentation

## 2016-09-06 DIAGNOSIS — E860 Dehydration: Secondary | ICD-10-CM | POA: Insufficient documentation

## 2016-09-06 DIAGNOSIS — I10 Essential (primary) hypertension: Secondary | ICD-10-CM | POA: Insufficient documentation

## 2016-09-06 DIAGNOSIS — Z79899 Other long term (current) drug therapy: Secondary | ICD-10-CM | POA: Diagnosis not present

## 2016-09-06 DIAGNOSIS — R55 Syncope and collapse: Secondary | ICD-10-CM

## 2016-09-06 LAB — CBC
HCT: 38.2 % (ref 35.0–47.0)
Hemoglobin: 13.3 g/dL (ref 12.0–16.0)
MCH: 32.1 pg (ref 26.0–34.0)
MCHC: 34.7 g/dL (ref 32.0–36.0)
MCV: 92.5 fL (ref 80.0–100.0)
PLATELETS: 338 10*3/uL (ref 150–440)
RBC: 4.13 MIL/uL (ref 3.80–5.20)
RDW: 14.4 % (ref 11.5–14.5)
WBC: 6 10*3/uL (ref 3.6–11.0)

## 2016-09-06 LAB — BASIC METABOLIC PANEL
Anion gap: 9 (ref 5–15)
BUN: 14 mg/dL (ref 6–20)
CHLORIDE: 101 mmol/L (ref 101–111)
CO2: 26 mmol/L (ref 22–32)
CREATININE: 0.9 mg/dL (ref 0.44–1.00)
Calcium: 9.1 mg/dL (ref 8.9–10.3)
GFR calc non Af Amer: >60 mL/min (ref >60)
Glucose, Bld: 95 mg/dL (ref 65–99)
Potassium: 3.8 mmol/L (ref 3.5–5.1)
Sodium: 136 mmol/L (ref 135–145)

## 2016-09-06 MED ORDER — SODIUM CHLORIDE 0.9 % IV BOLUS (SEPSIS)
1000.0000 mL | Freq: Once | INTRAVENOUS | Status: AC
  Administered 2016-09-06: 1000 mL via INTRAVENOUS

## 2016-09-06 NOTE — ED Triage Notes (Signed)
Pt brought in by [facility] from the [facility].  Pt states she had a syncopal episode today that she remembers having.  Pt hyperverbal upon arrival.  Pt states that she was told by ENT to come to ER for a CT of her face due to her having facial pain.  Pt states she has had a headache for 1 month, and that she had had chest pain for 2 weeks.  Pt also states that she feels that she is dehydrated and wants her sodium checked.  Pt states she is refusing depakote because he doctor was going to wean her off of it anyway.  Pt states she hasn't refused any other medications and if facility states that she has refused any other medications that it is not true.  Pt states that she has a lot of bruising from the depakote and that's why she is not taking it anymore.  Pt also states that her father just died.  Pt A&Ox4.

## 2016-09-06 NOTE — Discharge Instructions (Addendum)
Fortunately today your blood work with and your head CT were reassuring. Please make an appointment to follow-up with cardiology in the near future. Return to the emergency department for any concerns.  It was a pleasure to take care of you today, and thank you for coming to our emergency department.  If you have any questions or concerns before leaving please ask the nurse to grab me and I'm more than happy to go through your aftercare instructions again.  If you were prescribed any opioid pain medication today such as Norco, Vicodin, Percocet, morphine, hydrocodone, or oxycodone please make sure you do not drive when you are taking this medication as it can alter your ability to drive safely.  If you have any concerns once you are home that you are not improving or are in fact getting worse before you can make it to your follow-up appointment, please do not hesitate to call 911 and come back for further evaluation.  Darel Hong, MD  Results for orders placed or performed during the hospital encounter of 35/45/62  Basic metabolic panel  Result Value Ref Range   Sodium 136 135 - 145 mmol/L   Potassium 3.8 3.5 - 5.1 mmol/L   Chloride 101 101 - 111 mmol/L   CO2 26 22 - 32 mmol/L   Glucose, Bld 95 65 - 99 mg/dL   BUN 14 6 - 20 mg/dL   Creatinine, Ser 0.90 0.44 - 1.00 mg/dL   Calcium 9.1 8.9 - 10.3 mg/dL   GFR calc non Af Amer >60 >60 mL/min   GFR calc Af Amer >60 >60 mL/min   Anion gap 9 5 - 15  CBC  Result Value Ref Range   WBC 6.0 3.6 - 11.0 K/uL   RBC 4.13 3.80 - 5.20 MIL/uL   Hemoglobin 13.3 12.0 - 16.0 g/dL   HCT 38.2 35.0 - 47.0 %   MCV 92.5 80.0 - 100.0 fL   MCH 32.1 26.0 - 34.0 pg   MCHC 34.7 32.0 - 36.0 g/dL   RDW 14.4 11.5 - 14.5 %   Platelets 338 150 - 440 K/uL   Ct Head Wo Contrast  Result Date: 09/06/2016 CLINICAL DATA:  66 y/o  F; syncopal episode. EXAM: CT HEAD WITHOUT CONTRAST TECHNIQUE: Contiguous axial images were obtained from the base of the skull through the  vertex without intravenous contrast. COMPARISON:  05/03/2016 CT of the head. FINDINGS: Brain: No evidence of acute infarction, hemorrhage, hydrocephalus, extra-axial collection or mass lesion/mass effect. Stable mild chronic microvascular ischemic changes and parenchymal volume loss of the brain. Right caudate head lucency may represent a prominent perivascular space or chronic lacunar infarct. Vascular: Moderate calcific atherosclerosis of carotid siphons. Skull: Normal. Negative for fracture or focal lesion. Sinuses/Orbits: No acute finding. Other: None. IMPRESSION: 1. No acute intracranial abnormality. 2. Mild for age chronic microvascular ischemic changes and mild parenchymal volume loss of the brain. Electronically Signed   By: Kristine Garbe M.D.   On: 09/06/2016 19:22   Dg Chest Port 1 View  Result Date: 09/06/2016 CLINICAL DATA:  Chest pain for 2 weeks. EXAM: PORTABLE CHEST 1 VIEW COMPARISON:  01/12/2016 FINDINGS: There is a large hiatal hernia. Lungs are clear. Heart size is within normal limits. No focal airspace disease or pulmonary edema. The trachea is midline. IMPRESSION: No acute chest findings. Hiatal hernia. Electronically Signed   By: Markus Daft M.D.   On: 09/06/2016 17:33

## 2016-09-06 NOTE — ED Provider Notes (Signed)
[facility] Emergency Department Provider Note  ____________________________________________   First MD Initiated Contact with Patient 09/06/16 1637     (approximate)  I have reviewed the triage vital signs and the nursing notes.   HISTORY  Chief Complaint Near Syncope    HPI Angela [name] is a 66 y.o. female who comes to the emergency department via EMS after sustaining a syncopal episode at her nursing home. She was sitting talking with the pharmacist when the patient became upset and she began to feel nauseated lightheaded warm and the next thing she knew she was waking up still sitting in the chair. She had no antecedent chest pain or palpitations. She has no headache. She currently feels at her baseline. She says that she has been drinking less water than usual and she is concerned because she she has a previous history of hyponatremia. She is also worried about chronic headaches she's been having and she said that her ENT advised her to get a CT scan in the emergency department. There's been no change in her headaches for the past several months.  04/22/15 Echo: Study Conclusions  - Left ventricle: The cavity size was normal. Wall thickness was   normal. Systolic function was normal. The estimated ejection   fraction was in the range of 60% to 65%. Wall motion was normal;   there were no regional wall motion abnormalities. Doppler   parameters are consistent with abnormal left ventricular   relaxation (grade 1 diastolic dysfunction). - Mitral valve: There was mild regurgitation. - Right atrium: The right atrium appears to be compressed by an   outside structure likely the known hiatal hernia.   Past Medical History:  Diagnosis Date  . Allergic rhinitis   . Anemia   . Bipolar 1 disorder ([place])   . Blurred vision   . Bronchitis   . Chronic constipation   . Chronic sinusitis   . Depression   . Diverticulitis   . Diverticulosis   . Dizziness     . Endometriosis   . Endometriosis   . Facial pain   . Falls   . GERD (gastroesophageal reflux disease)   . GERD (gastroesophageal reflux disease)   . Hernia, hiatal   . Hypertension   . Hypothyroidism   . Malignant neoplasm of skin   . Migraine   . Neuropathy   . Otalgia   . Otitis media   . skin cancer   . Thyroid disease     Patient Active Problem List   Diagnosis Date Noted  . Memory loss 03/30/2016  . Gait abnormality 03/30/2016  . Mood disorder ([place]) 03/30/2016  . Personality disorder in adult 01/24/2016  . Absolute anemia 06/24/2015  . Clinical depression 06/24/2015  . Endometriosis 06/24/2015  . BP (high blood pressure) 06/24/2015  . Headache, migraine 06/24/2015  . Disease of thyroid gland 06/24/2015  . PMB (postmenopausal bleeding) 05/22/2015  . Bipolar 1 disorder ([place]) 04/23/2015  . Anxiety state 04/23/2015  . Colitis, acute 04/20/2015  . Melena 04/20/2015  . Accelerated hypertension 04/20/2015  . TIA (transient ischemic attack) 04/20/2015  . Hypothyroidism 04/20/2015  . GERD (gastroesophageal reflux disease) 04/20/2015  . Chest pain 04/20/2015  . Acute colitis 04/20/2015  . Cervical disc disorder with radiculopathy 03/25/2015  . Cervical pain 03/25/2015  . Pelvic pain in female 10/30/2014  . Bipolar affective disorder ([place]) 06/30/2014  . CA of skin 06/30/2014  . Cephalalgia 12/16/2013  . Neuropathy 12/16/2013    Past Surgical History:  Procedure Laterality Date  . APPENDECTOMY    . CHOLECYSTECTOMY    . CHOLECYSTECTOMY, LAPAROSCOPIC    . ESOPHAGOGASTRODUODENOSCOPY (EGD) WITH PROPOFOL N/A 03/29/2015   Procedure: ESOPHAGOGASTRODUODENOSCOPY (EGD) WITH PROPOFOL;  Surgeon: Josefine Class, MD;  Location: [facility] ENDOSCOPY;  Service: Endoscopy;  Laterality: N/A;  . HERNIA REPAIR    . laproscopy    . TONSILLECTOMY    . uteral suspension      Prior to Admission medications   Medication Sig Start Date End Date Taking? Authorizing Provider   amLODipine (NORVASC) 2.5 MG tablet Take 2.5 mg by mouth daily.    [provider]  Biotin 5000 MCG CAPS Take 1 capsule by mouth daily.     [provider]  calcium carbonate (OS-CAL) 600 MG TABS tablet Take 600 mg by mouth 2 (two) times daily with a meal.     [provider]  ciprofloxacin (CIPRO) 500 MG tablet Take 500 mg by mouth 2 (two) times daily.    [provider]  conjugated estrogens (PREMARIN) vaginal cream Place 1 Applicatorful vaginally daily. 07/20/16   Rubie Maid, MD  divalproex (DEPAKOTE) 250 MG DR tablet Take 1 tablet (250 mg total) by mouth at bedtime. 01/25/16   Clapacs, Madie Reno, MD  estradiol (ESTRING) 2 MG vaginal ring Place 2 mg vaginally every 3 (three) months. follow package directions 07/20/16   Rubie Maid, MD  ferrous sulfate (FERROUSUL) 325 (65 FE) MG tablet Take 1 tablet (325 mg total) by mouth daily with breakfast. 07/20/16   Rubie Maid, MD  lactose free nutrition (BOOST PLUS) LIQD Take 237 mLs by mouth daily. 07/20/16   Rubie Maid, MD  levothyroxine (SYNTHROID, LEVOTHROID) 88 MCG tablet Take 88 mcg by mouth daily before breakfast. Reported on 06/24/2015    [provider]  Linaclotide (LINZESS) 290 MCG CAPS capsule Take 290 mcg by mouth daily.    [provider]  pantoprazole (PROTONIX) 40 MG tablet Take 1 tablet (40 mg total) by mouth 2 (two) times daily before a meal. 04/26/15   Demetrios Loll, MD  PARoxetine (PAXIL) 20 MG tablet Take 1 tablet (20 mg total) by mouth daily. 01/26/16   Clapacs, Madie Reno, MD  PARoxetine (PAXIL) 30 MG tablet Take 40 mg by mouth daily.     [provider]  ranitidine ([employer] RANITIDINE) 75 MG tablet Take by mouth.    [provider]  tiZANidine (ZANAFLEX) 4 MG tablet Take 4 mg by mouth every 6 (six) hours as needed for muscle spasms.    [provider]  traZODone (DESYREL) 50 MG tablet Take 1 tablet (50 mg total) by mouth at bedtime. 01/25/16   Clapacs, Madie Reno, MD   zolpidem (AMBIEN) 10 MG tablet Take 10 mg by mouth at bedtime as needed for sleep.    [provider]    Allergies Bee venom; Darvon [propoxyphene]; Dicyclomine; Other; Oxycodone-acetaminophen; Peanuts [peanut oil]; Penicillins; Percocet [oxycodone-acetaminophen]; Sulfa antibiotics; Sulfacetamide sodium; Ultram [tramadol]; Amikacin; Aspirin; Bee pollen; Doxycycline; Haloperidol; Hymenoptera venom preparations; Imipramine; Keflex [cephalexin]; Sulfur; Adhesive [tape]; Hydroxyzine; and Prednisone  Family History  Problem Relation Age of Onset  . Heart disease Father   . Cancer Mother        lung  . Urolithiasis Neg Hx   . Kidney disease Neg Hx   . Kidney cancer Neg Hx   . Prostate cancer Neg Hx     Social History Social History  Substance Use Topics  . Smoking status: Former Research scientist (life sciences)  . Smokeless  tobacco: Never Used  . Alcohol use No    Review of Systems Constitutional: No fever/chills Eyes: No visual changes. ENT: No sore throat. Cardiovascular: Denies chest pain. Respiratory: Denies shortness of breath. Gastrointestinal: No abdominal pain.  Positive nausea, no vomiting.  No diarrhea.  No constipation. Genitourinary: Negative for dysuria. Musculoskeletal: Negative for back pain. Skin: Negative for rash. Neurological: Positive for headache   ____________________________________________   PHYSICAL EXAM:  VITAL SIGNS: ED Triage Vitals  Enc Vitals Group     BP      Pulse      Resp      Temp      Temp src      SpO2      Weight      Height      Head Circumference      Peak Flow      Pain Score      Pain Loc      Pain Edu?      Excl. in [place]?     Constitutional: Alert and oriented 4 somewhat pressured speech nontoxic no diaphoresis speaks in full clear sentences Eyes: PERRL EOMI. Head: Atraumatic. Nose: No congestion/rhinnorhea. Mouth/Throat: No trismus Neck: No stridor.   Cardiovascular: Normal rate, regular rhythm. Grossly normal heart sounds.   Good peripheral circulation. Respiratory: Normal respiratory effort.  No retractions. Lungs CTAB and moving good air Gastrointestinal: Soft nontender Musculoskeletal: No lower extremity edema   Neurologic:  Normal speech and language. No gross focal neurologic deficits are appreciated. Skin:  Skin is warm, dry and intact. No rash noted. Psychiatric: Somewhat pressured speech but not manic    ____________________________________________   DIFFERENTIAL includes but not limited to  Cardiogenic syncope, vasovagal syncope, stroke, intracerebral hemorrhage, metabolic arrangement, dehydration ____________________________________________   LABS (all labs ordered are listed, but only abnormal results are displayed)  Labs Reviewed  BASIC METABOLIC PANEL  CBC  URINALYSIS, COMPLETE (UACMP) WITH MICROSCOPIC  CBG MONITORING, ED    Blood work unremarkable __________________________________________  EKG  ED ECG REPORT I, Darel Hong, the attending physician, personally viewed and interpreted this ECG.  Date: 09/06/2016 Rate: 54 Rhythm: Sinus bradycardia QRS Axis: normal Intervals: normal ST/T Wave abnormalities: normal Narrative Interpretation: No arrhythmias, no blocks, no Brugada, no HOCM, no signs of acute ischemia, normal EKG  ____________________________________________  RADIOLOGY  Chest x-ray and head CT both with no acute disease ____________________________________________   PROCEDURES  Procedure(s) performed: no  Procedures  Critical Care performed: no  Observation: no ____________________________________________   INITIAL IMPRESSION / ASSESSMENT AND PLAN / ED COURSE  Pertinent labs & imaging results that were available during my care of the patient were reviewed by me and considered in my medical decision making (see chart for details).  The patient arrives very well-appearing and hemodynamically stable after a syncopal event. She did not strike her  head. Her history today is most consistent with vasovagal syncope. Her EKG is reassuring with no malignant signs of syncope. She was observed on monitor for several hours with no ectopy. Head CT obtained given her chronic headaches and fortunately is negative for acute disease. The patient is discharged back home in improved condition. I am going to refer her on to see a cardiologist.      ____________________________________________   FINAL CLINICAL IMPRESSION(S) / ED DIAGNOSES  Final diagnoses:  Syncope, unspecified syncope type  Dehydration      NEW MEDICATIONS STARTED DURING THIS VISIT:  Discharge Medication List as of 09/06/2016  7:47 PM  Note:  This document was prepared using Dragon voice recognition software and may include unintentional dictation errors.     Darel Hong, MD 09/06/16 2258

## 2016-09-06 NOTE — ED Notes (Signed)
Pt discharged to home.  Discharge instructions reviewed.  Verbalized understanding.  No questions or concerns at this time.  Teach back verified.  Pt in NAD.  No items left in ED.   

## 2016-09-08 DIAGNOSIS — E222 Syndrome of inappropriate secretion of antidiuretic hormone: Secondary | ICD-10-CM

## 2016-09-08 HISTORY — DX: Syndrome of inappropriate secretion of antidiuretic hormone: E22.2

## 2016-09-19 ENCOUNTER — Ambulatory Visit (INDEPENDENT_AMBULATORY_CARE_PROVIDER_SITE_OTHER): Payer: Medicare Other | Admitting: Podiatry

## 2016-09-19 DIAGNOSIS — M722 Plantar fascial fibromatosis: Secondary | ICD-10-CM

## 2016-09-20 ENCOUNTER — Telehealth: Payer: Self-pay | Admitting: Podiatry

## 2016-09-20 NOTE — Telephone Encounter (Signed)
She needs to be seen by Orthopedic for management of knee pain/pathology.  Thanks, Dr. Amalia Hailey

## 2016-09-20 NOTE — Telephone Encounter (Signed)
Patient called the office and stated that her knee has gone out on her, and that she discussed this condition with Dr. Amalia Hailey when she was here yesterday. She is requesting that Dr. Amalia Hailey write and order for her to get a wheel chair to use to transport her to the dining room at [employer].

## 2016-09-21 NOTE — Progress Notes (Signed)
   HPI: 66 year old female presents to the office today for follow up evaluation of bilateral ankle pain and bilateral plantar fasciitis. She states her pain is unchanged and is still experiencing swelling to the feet and ankles. She is here for further evaluation and treatment.   Physical Exam: General: The patient is alert and oriented x3 in no acute distress.  Dermatology: Hyperkeratotic dystrophic nail noted to the right great toenail. Skin is warm, dry and supple bilateral lower extremities. Negative for open lesions or macerations.  Vascular: Palpable pedal pulses bilaterally. No edema or erythema noted. Capillary refill within normal limits.  Neurological: Epicritic and protective threshold grossly intact bilaterally.   Musculoskeletal Exam: Hammertoe contracture deformity digits 2-5 of the bilateral feet noted. Range of motion within normal limits to all pedal and ankle joints bilateral. Muscle strength 5/5 in all groups bilateral. There is also some pain on palpation to the anterior medial and lateral aspects the patient's bilateral ankle as well as an on palpation to the medial calcaneal tubercle the bilateral heels consistent with plantar fasciitis.  Assessment: 1. Plantar fasciitis bilaterally  Plan of Care:  1. Patient was evaluated. 2. Injection of 0.5 mL Celestone Soluspan injected in the patient's bilateral plantar fascial heels.  3. Continue compression socks, plantar fasciitis braces and Voltaren 3% gel. 4. Orders for physical therapy placed. 5. Return to clinic in 8 weeks.  Edrick Kins, DPM [facility]  Dr. Edrick Kins, DPM    [address], [place] [address]                Office [phone]  Fax [phone]

## 2016-09-22 ENCOUNTER — Telehealth: Payer: Self-pay | Admitting: Obstetrics and Gynecology

## 2016-09-22 NOTE — Telephone Encounter (Signed)
Patient called and stated that she still has a breast infection, and would like to speak with [facility]. The patient stated that she would like to speak with [facility] before 3:30p and would like to speak with her more towards 1:30p after lunch. I told the patient that Charlott Rakes will be in clinic after lunch and I can not guarantee what time Charlott Rakes will call her. Patient was being argumentative. Please advise.

## 2016-09-25 MED ORDER — BETAMETHASONE SOD PHOS & ACET 6 (3-3) MG/ML IJ SUSP: 3.0000 mg | Freq: Once | INTRAMUSCULAR | Status: DC

## 2016-09-25 NOTE — Telephone Encounter (Signed)
Spoke with representative from nursing home where pt is currently living, who states that the pt is still having issues with her breast. Informed rep that Dr.cherry has advised pt to get a mammogram as the next step in her breast evaluation and the pt adamantly refused. Advised that there is nothing else that can be done without mammo.

## 2016-09-28 ENCOUNTER — Telehealth: Payer: Self-pay

## 2016-09-28 NOTE — Telephone Encounter (Signed)
Referral has been made for outpt rehab. Order in chart.  Patient is aware and will call to schedule her appt

## 2016-09-28 NOTE — Telephone Encounter (Signed)
Patient notified to contact orthopedics for wheelchair and management of knee pain

## 2016-09-28 NOTE — Addendum Note (Signed)
Addended by: Graceann Congress D on: 09/28/2016 10:59 AM   Modules accepted: Orders

## 2016-09-28 NOTE — Telephone Encounter (Signed)
-----   Message from Edrick Kins, DPM sent at 09/19/2016  2:06 PM EDT ----- Regarding: Physical therapy Please order physical therapy 3x/week x 4 weeks.   Dx: plantar fasciitis b/l  Thanks, Dr. Amalia Hailey

## 2016-09-29 ENCOUNTER — Emergency Department
Admission: EM | Admit: 2016-09-29 | Discharge: 2016-09-30 | Disposition: A | Discharge status: Other Acute Inpatient Hospital | Payer: Medicare Other | Attending: Emergency Medicine | Admitting: Emergency Medicine

## 2016-09-29 DIAGNOSIS — Z87891 Personal history of nicotine dependence: Secondary | ICD-10-CM | POA: Diagnosis not present

## 2016-09-29 DIAGNOSIS — F312 Bipolar disorder, current episode manic severe with psychotic features: Secondary | ICD-10-CM | POA: Diagnosis not present

## 2016-09-29 DIAGNOSIS — E039 Hypothyroidism, unspecified: Secondary | ICD-10-CM | POA: Diagnosis not present

## 2016-09-29 DIAGNOSIS — E079 Disorder of thyroid, unspecified: Secondary | ICD-10-CM | POA: Insufficient documentation

## 2016-09-29 DIAGNOSIS — Z79899 Other long term (current) drug therapy: Secondary | ICD-10-CM | POA: Insufficient documentation

## 2016-09-29 DIAGNOSIS — Z85828 Personal history of other malignant neoplasm of skin: Secondary | ICD-10-CM | POA: Insufficient documentation

## 2016-09-29 DIAGNOSIS — Z91010 Allergy to peanuts: Secondary | ICD-10-CM | POA: Insufficient documentation

## 2016-09-29 DIAGNOSIS — I10 Essential (primary) hypertension: Secondary | ICD-10-CM | POA: Diagnosis not present

## 2016-09-29 DIAGNOSIS — Z046 Encounter for general psychiatric examination, requested by authority: Secondary | ICD-10-CM | POA: Diagnosis present

## 2016-09-29 DIAGNOSIS — F3110 Bipolar disorder, current episode manic without psychotic features, unspecified: Secondary | ICD-10-CM

## 2016-09-29 LAB — URINALYSIS, COMPLETE (UACMP) WITH MICROSCOPIC
Bacteria, UA: NONE SEEN
Bilirubin Urine: NEGATIVE
GLUCOSE, UA: NEGATIVE mg/dL
Hgb urine dipstick: NEGATIVE
Ketones, ur: NEGATIVE mg/dL
LEUKOCYTES UA: NEGATIVE
Nitrite: NEGATIVE
Protein, ur: NEGATIVE mg/dL
Specific Gravity, Urine: 1.003 — ABNORMAL LOW (ref 1.005–1.030)
pH: 7 (ref 5.0–8.0)

## 2016-09-29 LAB — COMPREHENSIVE METABOLIC PANEL
ALT: 23 U/L (ref 14–54)
AST: 26 U/L (ref 15–41)
Albumin: 3.8 g/dL (ref 3.5–5.0)
Alkaline Phosphatase: 73 U/L (ref 38–126)
Anion gap: 10 (ref 5–15)
BUN: 16 mg/dL (ref 6–20)
CHLORIDE: 102 mmol/L (ref 101–111)
CO2: 23 mmol/L (ref 22–32)
Calcium: 9.3 mg/dL (ref 8.9–10.3)
Creatinine, Ser: 0.6 mg/dL (ref 0.44–1.00)
GFR calc Af Amer: >60 mL/min (ref >60)
GFR calc non Af Amer: >60 mL/min (ref >60)
GLUCOSE: 101 mg/dL — AB (ref 65–99)
POTASSIUM: 3.9 mmol/L (ref 3.5–5.1)
SODIUM: 135 mmol/L (ref 135–145)
Total Bilirubin: 0.3 mg/dL (ref 0.3–1.2)
Total Protein: 6.6 g/dL (ref 6.5–8.1)

## 2016-09-29 LAB — SALICYLATE LEVEL: Salicylate Lvl: <7 mg/dL (ref 2.8–30.0)

## 2016-09-29 LAB — CBC
HEMATOCRIT: 36.8 % (ref 35.0–47.0)
HEMOGLOBIN: 13.1 g/dL (ref 12.0–16.0)
MCH: 32.4 pg (ref 26.0–34.0)
MCHC: 35.6 g/dL (ref 32.0–36.0)
MCV: 91.1 fL (ref 80.0–100.0)
Platelets: 386 10*3/uL (ref 150–440)
RBC: 4.04 MIL/uL (ref 3.80–5.20)
RDW: 13.8 % (ref 11.5–14.5)
WBC: 12.1 10*3/uL — ABNORMAL HIGH (ref 3.6–11.0)

## 2016-09-29 LAB — URINE DRUG SCREEN, QUALITATIVE (ARMC ONLY)
Amphetamines, Ur Screen: NOT DETECTED
BARBITURATES, UR SCREEN: NOT DETECTED
Benzodiazepine, Ur Scrn: NOT DETECTED
Cannabinoid 50 Ng, Ur ~~LOC~~: NOT DETECTED
Cocaine Metabolite,Ur ~~LOC~~: NOT DETECTED
MDMA (ECSTASY) UR SCREEN: NOT DETECTED
METHADONE SCREEN, URINE: NOT DETECTED
OPIATE, UR SCREEN: NOT DETECTED
Phencyclidine (PCP) Ur S: NOT DETECTED
Tricyclic, Ur Screen: NOT DETECTED

## 2016-09-29 LAB — ACETAMINOPHEN LEVEL: Acetaminophen (Tylenol), Serum: <10 ug/mL — ABNORMAL LOW (ref 10–30)

## 2016-09-29 LAB — ETHANOL: Alcohol, Ethyl (B): <5 mg/dL (ref <5)

## 2016-09-29 MED ORDER — TRAZODONE HCL 50 MG PO TABS
50.0000 mg | ORAL_TABLET | Freq: Every day | ORAL | Status: DC
  Administered 2016-09-29: 50 mg via ORAL
  Filled 2016-09-29: qty 1

## 2016-09-29 MED ORDER — AMLODIPINE BESYLATE 5 MG PO TABS
2.5000 mg | ORAL_TABLET | Freq: Every day | ORAL | Status: DC
  Administered 2016-09-30: 2.5 mg via ORAL
  Filled 2016-09-29: qty 1

## 2016-09-29 MED ORDER — PAROXETINE HCL 20 MG PO TABS
40.0000 mg | ORAL_TABLET | Freq: Every day | ORAL | Status: DC
  Administered 2016-09-30: 20 mg via ORAL
  Filled 2016-09-29: qty 2

## 2016-09-29 MED ORDER — ZOLPIDEM TARTRATE 5 MG PO TABS: 10.0000 mg | ORAL_TABLET | Freq: Every evening | ORAL | Status: DC | PRN

## 2016-09-29 MED ORDER — OLANZAPINE 5 MG PO TBDP
ORAL_TABLET | ORAL | Status: AC
  Filled 2016-09-29: qty 1

## 2016-09-29 MED ORDER — PANTOPRAZOLE SODIUM 40 MG PO TBEC
40.0000 mg | DELAYED_RELEASE_TABLET | Freq: Two times a day (BID) | ORAL | Status: DC
  Administered 2016-09-29 – 2016-09-30 (×2): 40 mg via ORAL
  Filled 2016-09-29 (×2): qty 1

## 2016-09-29 MED ORDER — FERROUS SULFATE 325 (65 FE) MG PO TABS
325.0000 mg | ORAL_TABLET | Freq: Every day | ORAL | Status: DC
  Filled 2016-09-29: qty 1

## 2016-09-29 MED ORDER — OLANZAPINE 5 MG PO TBDP
5.0000 mg | ORAL_TABLET | Freq: Every day | ORAL | Status: DC
  Filled 2016-09-29: qty 1

## 2016-09-29 MED ORDER — OLANZAPINE 5 MG PO TBDP
5.0000 mg | ORAL_TABLET | Freq: Every day | ORAL | Status: DC
  Administered 2016-09-29: 5 mg via ORAL
  Filled 2016-09-29 (×2): qty 1

## 2016-09-29 MED ORDER — DIVALPROEX SODIUM 250 MG PO DR TAB
250.0000 mg | DELAYED_RELEASE_TABLET | Freq: Every day | ORAL | Status: DC
  Administered 2016-09-29: 250 mg via ORAL
  Filled 2016-09-29: qty 1

## 2016-09-29 MED ORDER — LEVOTHYROXINE SODIUM 88 MCG PO TABS
88.0000 ug | ORAL_TABLET | Freq: Every day | ORAL | Status: DC
  Administered 2016-09-30: 88 ug via ORAL
  Filled 2016-09-29: qty 1
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
## 2016-09-29 NOTE — ED Notes (Signed)
BEHAVIORAL HEALTH ROUNDING Patient sleeping: Yes.   Patient alert and oriented: not applicable SLEEPING Behavior appropriate: Yes.  ; If no, describe: SLEEPING Nutrition and fluids offered: No SLEEPING Toileting and hygiene offered: NoSLEEPING Sitter present: not applicable, Q 15 min safety rounds and observation via security camera. Law enforcement present: Yes ODS 

## 2016-09-29 NOTE — ED Notes (Signed)
Patients bed was wet from ginger ale. This tech changed the linens and got patient warm blanket. When walking out of the room patient stated there is not enough room on this bed for me and my drink and told me to bring her a chair because her bed and pillow was wet. This tech changed the linens again and removed cup of ginger ale from patient room. Patient has been asked repeatedly to remove the trash from her room and the blanket that she has thrown on the floor and pt stated that I (tech) am throwing the trash on the floor and will not pick it up.
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
## 2016-09-29 NOTE — ED Notes (Signed)
Pt eating

## 2016-09-29 NOTE — ED Provider Notes (Signed)
EKG read and interpreted by me shows normal sinus rhythm rate of 68 normal axis somewhat irregular baseline but no acute ST-T wave changes   Angela Polio, MD 09/29/16 [phone]

## 2016-09-29 NOTE — ED Notes (Signed)
Pt brought into ED BHU via sally port in a wheelchair. Pt stood out of wheel and wand with Clinical biochemist for safety by ODS officer. Pt ambulated to her room with slow but steady gait. Patient oriented to unit/care area: Pt informed of unit policies and procedures.  Informed that, for their safety, care areas are designed for safety and monitored by security cameras at all times; and visiting hours explained to patient. Patient verbalizes understanding, and verbal contract for safety obtained.Pt shown to their room.   ENVIRONMENTAL ASSESSMENT  Potentially harmful objects out of patient reach: Yes.  Personal belongings secured: Yes.  Patient dressed in hospital provided attire only: Yes.  Plastic bags out of patient reach: Yes.  Patient care equipment (cords, cables, call bells, lines, and drains) shortened, removed, or accounted for: Yes.  Equipment and supplies removed from bottom of stretcher: Yes.  Potentially toxic materials out of patient reach: Yes.  Sharps container removed or out of patient reach: Yes.   BEHAVIORAL HEALTH ROUNDING  Patient sleeping: No.  Patient alert and oriented: yes  Behavior appropriate: Yes. ; If no, describe:  Nutrition and fluids offered: Yes  Toileting and hygiene offered: Yes  Sitter present: not applicable, Q 15 min safety rounds and observation via security camera. Law enforcement present: Yes ODS  ED [place]  Is the patient under IVC or is there intent for IVC: Yes.  Is the patient medically cleared: Yes.  Is there vacancy in the ED BHU: Yes.  Is the population mix appropriate for patient: Yes.  Is the patient awaiting placement in inpatient or outpatient setting: Yes.  Has the patient had a psychiatric consult: Yes.  Survey of unit performed for contraband, proper placement and condition of furniture, tampering with fixtures in bathroom, shower, and each patient room: Yes. ; Findings: All clear  APPEARANCE/BEHAVIOR  calm,  cooperative and adequate rapport can be established  NEURO ASSESSMENT  Orientation: time, place and person  Hallucinations: No.None noted (Hallucinations)  Speech: Normal  Gait: slow but otherwise normal  RESPIRATORY ASSESSMENT  WNL  CARDIOVASCULAR ASSESSMENT  WNL  GASTROINTESTINAL ASSESSMENT  WNL  EXTREMITIES  WNL  PLAN OF CARE  Provide calm/safe environment. Vital signs assessed TID. ED BHU Assessment once each 12-hour shift. Collaborate with TTS daily or as condition indicates. Assure the ED provider has rounded once each shift. Provide and encourage hygiene. Provide redirection as needed. Assess for escalating behavior; address immediately and inform ED provider.  Assess family dynamic and appropriateness for visitation as needed: Yes. ; If necessary, describe findings:  Educate the patient/family about BHU procedures/visitation: Yes. ; If necessary, describe findings: Pt is calm and cooperative at this time. Pt understanding and accepting of unit procedures/rules. Will continue to monitor with Q 15 min safety rounds and observation via security camera.

## 2016-09-29 NOTE — ED Notes (Addendum)
Pt heard crying in room, asked reason reports "I'm a widow and I'm mourning my husband"  Pt has Gatorade in fridge   Pt wearing TED hose reports the need for orthopedic shoes and wrist brace, pt informed these devices were not allowed in the ED or BHU, pt voiced understanding, requested wheelchair to move to [facility]

## 2016-09-29 NOTE — ED Notes (Signed)
SOC placed at bedside.

## 2016-09-29 NOTE — ED Notes (Signed)
Patient complaining about chest pressure. Vitals obtained. Patient first states heart is racing then states chest pain then chest pressure, then states all over pain. This Probation officer asked patient to be specific on pain areas, patient states not having pain all over or chest pain, just having pressure in her chest.Patient denies radiating pain to left arm or any other symptoms.

## 2016-09-29 NOTE — ED Notes (Signed)
Dr Cinda Quest informed of pt's co's. No new orders received.

## 2016-09-29 NOTE — ED Notes (Signed)
BEHAVIORAL HEALTH ROUNDING  Patient sleeping: No.  Patient alert and oriented: yes  Behavior appropriate: Yes. ; If no, describe:  Nutrition and fluids offered: Yes  Toileting and hygiene offered: Yes  Sitter present: not applicable, Q 15 min safety rounds and observation via security camera. Law enforcement present: Yes ODS  
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
## 2016-09-29 NOTE — ED Notes (Signed)
Called [facility] for consult   [phone]

## 2016-09-29 NOTE — ED Notes (Signed)
Pt requesting this RN, pt ambulatory to toilet without difficulty noted

## 2016-09-29 NOTE — Progress Notes (Signed)
LCSW consulted with EDRN and patient is to be admitted. I will let TTS know

## 2016-09-29 NOTE — ED Notes (Signed)
PT IVC/ PENDING INPT. PLACEMENT

## 2016-09-29 NOTE — ED Notes (Signed)
Pt talking with SOC at this time.
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
## 2016-09-30 DIAGNOSIS — F312 Bipolar disorder, current episode manic severe with psychotic features: Secondary | ICD-10-CM | POA: Diagnosis not present

## 2016-09-30 MED ORDER — LORAZEPAM 1 MG PO TABS
1.0000 mg | ORAL_TABLET | Freq: Four times a day (QID) | ORAL | Status: DC | PRN
Start: 2016-09-30 — End: 2016-09-30

## 2016-09-30 MED ORDER — IBUPROFEN 600 MG PO TABS
600.0000 mg | ORAL_TABLET | Freq: Once | ORAL | Status: AC
  Administered 2016-09-30: 600 mg via ORAL
  Filled 2016-09-30: qty 1

## 2016-09-30 NOTE — ED Notes (Addendum)
Pt would not take iron pill and would take only 20 mg of prescribed 40 mg Paxil. She said taking the full dose would make her manic. She has required much time and attention this shift due to many complaints and requests, most of which were aimed at having her IVC lifted. She has been ambulatory and in no acute distress, however, and remains cooperative. She was tearful when speaking about the loss this year of her father and her "sweetie." She spoke about being born in [place] and spending her early childhood in [place]. She said her father is from [place]. Will continue to monitor for needs/safety.

## 2016-09-30 NOTE — ED Provider Notes (Signed)
-----------------------------------------   4:42 AM on 09/30/2016 -----------------------------------------   Blood pressure (!) 183/81, pulse 73, temperature 98 F (36.7 C), temperature source Oral, resp. rate 20, SpO2 100 %.  The patient had no acute events since last update.  Calm and cooperative at this time.  SOC recommended inpatient geropsych placement.     Hinda Kehr, MD 09/30/16 [phone]

## 2016-09-30 NOTE — ED Notes (Signed)
BEHAVIORAL HEALTH ROUNDING Patient sleeping: Yes.   Patient alert and oriented: not applicable SLEEPING Behavior appropriate: Yes.  ; If no, describe: SLEEPING Nutrition and fluids offered: No SLEEPING Toileting and hygiene offered: NoSLEEPING Sitter present: not applicable, Q 15 min safety rounds and observation via security camera. Law enforcement present: Yes ODS 

## 2016-09-30 NOTE — BH Assessment (Signed)
Referral for Geriatric placement submitted to the following:   [facility] (p-[phone]/f-[phone])   [facility] (p-[phone]/[phone])   [facility] (p-[phone]/f-[phone])   [facility] (p-[phone]/[phone])   [facility] (f-[phone])   [facility] (p-[phone]/f-[phone])   [facility] (p-[phone]/f-[phone]) [place] [phone] 

## 2016-09-30 NOTE — Progress Notes (Addendum)
Spoke to Jeffery at [facility] and he is accepting the patient to his unit accepting doctor Dr Rosado  Spoke to ED secretary Tracy  to arrange transportation by [employer].  Call report number  [phone] ask for [facility] Unit.  Amberlea Spagnuolo LCSW [phone] 

## 2016-09-30 NOTE — ED Notes (Signed)
Pt was discharged per order for transfer to [facility] in care of [facility]. Pt had been ambulatory for duration of stay on unit but requested wheelchair transport to vehicle, which was given. Pt was in no acute distress. Belongings were given to sheriff as was paperwork.

## 2016-09-30 NOTE — ED Notes (Signed)
Attempted to call report to [phone] but was told to call back when the sheriff arrived.

## 2016-09-30 NOTE — Progress Notes (Signed)
LCSW reviewed patient information to [name] and patient is UNDER REVIEW and Jeffrey will call us back with a decision if they will accept her. Awaiting call back  Windy Dudek LCSW [phone]   

## 2016-10-10 ENCOUNTER — Ambulatory Visit: Payer: Medicare Other | Attending: Podiatry | Admitting: Physical Therapy

## 2016-10-12 ENCOUNTER — Ambulatory Visit: Payer: Medicare Other | Admitting: Physical Therapy

## 2016-10-17 ENCOUNTER — Ambulatory Visit: Payer: Medicare Other | Admitting: Physical Therapy

## 2016-10-19 ENCOUNTER — Encounter: Payer: Medicare Other | Admitting: Physical Therapy

## 2016-10-24 ENCOUNTER — Telehealth: Payer: Self-pay | Admitting: Obstetrics and Gynecology

## 2016-10-24 ENCOUNTER — Emergency Department
Admission: EM | Admit: 2016-10-24 | Discharge: 2016-10-24 | Disposition: A | Discharge status: Home / Self Care | Payer: Medicare Other | Attending: Student in an Organized Health Care Education/Training Program | Admitting: Student in an Organized Health Care Education/Training Program

## 2016-10-24 ENCOUNTER — Encounter: Payer: Self-pay | Admitting: *Deleted

## 2016-10-24 ENCOUNTER — Emergency Department: Payer: Medicare Other

## 2016-10-24 DIAGNOSIS — Z8673 Personal history of transient ischemic attack (TIA), and cerebral infarction without residual deficits: Secondary | ICD-10-CM | POA: Diagnosis not present

## 2016-10-24 DIAGNOSIS — I10 Essential (primary) hypertension: Secondary | ICD-10-CM | POA: Insufficient documentation

## 2016-10-24 DIAGNOSIS — Z79899 Other long term (current) drug therapy: Secondary | ICD-10-CM | POA: Insufficient documentation

## 2016-10-24 DIAGNOSIS — Z87891 Personal history of nicotine dependence: Secondary | ICD-10-CM | POA: Diagnosis not present

## 2016-10-24 DIAGNOSIS — R202 Paresthesia of skin: Secondary | ICD-10-CM | POA: Diagnosis not present

## 2016-10-24 DIAGNOSIS — R0602 Shortness of breath: Secondary | ICD-10-CM | POA: Diagnosis present

## 2016-10-24 DIAGNOSIS — E039 Hypothyroidism, unspecified: Secondary | ICD-10-CM | POA: Diagnosis not present

## 2016-10-24 LAB — URINALYSIS, COMPLETE (UACMP) WITH MICROSCOPIC
Bilirubin Urine: NEGATIVE
Glucose, UA: NEGATIVE mg/dL
Ketones, ur: NEGATIVE mg/dL
Leukocytes, UA: NEGATIVE
Nitrite: NEGATIVE
PROTEIN: NEGATIVE mg/dL
Specific Gravity, Urine: 1.002 — ABNORMAL LOW (ref 1.005–1.030)
pH: 6 (ref 5.0–8.0)

## 2016-10-24 LAB — CBC WITH DIFFERENTIAL/PLATELET
Basophils Absolute: 0 10*3/uL (ref 0–0.1)
Basophils Relative: 0 %
EOS PCT: 1 %
Eosinophils Absolute: 0.1 10*3/uL (ref 0–0.7)
HEMATOCRIT: 43.6 % (ref 35.0–47.0)
Hemoglobin: 15 g/dL (ref 12.0–16.0)
LYMPHS ABS: 1.7 10*3/uL (ref 1.0–3.6)
LYMPHS PCT: 21 %
MCH: 31.6 pg (ref 26.0–34.0)
MCHC: 34.4 g/dL (ref 32.0–36.0)
MCV: 91.7 fL (ref 80.0–100.0)
MONO ABS: 0.5 10*3/uL (ref 0.2–0.9)
MONOS PCT: 6 %
Neutro Abs: 5.7 10*3/uL (ref 1.4–6.5)
Neutrophils Relative %: 72 %
PLATELETS: 414 10*3/uL (ref 150–440)
RBC: 4.76 MIL/uL (ref 3.80–5.20)
RDW: 14.4 % (ref 11.5–14.5)
WBC: 8.1 10*3/uL (ref 3.6–11.0)

## 2016-10-24 LAB — COMPREHENSIVE METABOLIC PANEL
ALT: 29 U/L (ref 14–54)
AST: 38 U/L (ref 15–41)
Albumin: 4.4 g/dL (ref 3.5–5.0)
Alkaline Phosphatase: 87 U/L (ref 38–126)
Anion gap: 13 (ref 5–15)
BILIRUBIN TOTAL: 0.4 mg/dL (ref 0.3–1.2)
BUN: 11 mg/dL (ref 6–20)
CHLORIDE: 97 mmol/L — AB (ref 101–111)
CO2: 22 mmol/L (ref 22–32)
CREATININE: 0.71 mg/dL (ref 0.44–1.00)
Calcium: 9.1 mg/dL (ref 8.9–10.3)
Glucose, Bld: 113 mg/dL — ABNORMAL HIGH (ref 65–99)
POTASSIUM: 3.2 mmol/L — AB (ref 3.5–5.1)
Sodium: 132 mmol/L — ABNORMAL LOW (ref 135–145)
Total Protein: 8 g/dL (ref 6.5–8.1)

## 2016-10-24 LAB — URINE DRUG SCREEN, QUALITATIVE (ARMC ONLY)
Amphetamines, Ur Screen: NOT DETECTED
Barbiturates, Ur Screen: NOT DETECTED
Benzodiazepine, Ur Scrn: NOT DETECTED
CANNABINOID 50 NG, UR ~~LOC~~: NOT DETECTED
Cocaine Metabolite,Ur ~~LOC~~: NOT DETECTED
MDMA (ECSTASY) UR SCREEN: NOT DETECTED
Methadone Scn, Ur: NOT DETECTED
OPIATE, UR SCREEN: NOT DETECTED
PHENCYCLIDINE (PCP) UR S: NOT DETECTED
Tricyclic, Ur Screen: NOT DETECTED

## 2016-10-24 LAB — TROPONIN I

## 2016-10-24 MED ORDER — LORAZEPAM 1 MG PO TABS
1.0000 mg | ORAL_TABLET | Freq: Once | ORAL | Status: AC
  Administered 2016-10-24: 1 mg via ORAL
  Filled 2016-10-24: qty 1

## 2016-10-24 MED ORDER — POTASSIUM CHLORIDE CRYS ER 20 MEQ PO TBCR
40.0000 meq | EXTENDED_RELEASE_TABLET | Freq: Once | ORAL | Status: AC
  Administered 2016-10-24: 40 meq via ORAL
  Filled 2016-10-24: qty 2

## 2016-10-24 NOTE — ED Notes (Signed)
Attempted iv x 1 without success.  Pt upset and request different nurse.

## 2016-10-24 NOTE — ED Triage Notes (Signed)
Pt brought in [facility]   Pt reports she is having a reaction to cream used for scabies.  Dx with scabies 1 week ago.  Pt alert.

## 2016-10-24 NOTE — ED Notes (Signed)
Pt reports she is having a reaction to scabies cream  States I left it on too tong   Pt left cream on for 2 days.  nsr on monitor.  Pt alert and talking at this time.

## 2016-10-24 NOTE — Telephone Encounter (Signed)
Patient lvm stating that she is experiencing "break through bleeding again" The patient also disclosed that she needs a prescription of (Progestrone) until she is seen/can get in with Dr. Marcelline Mates. The patient stated that she is hoping that she can get the prescription today if possible and would like a call back from a nurse to discuss her questions and concerns she is having with her bleeding. The patient's preferred pharmacy is [facility] on [address]. No other information was disclosed. Please advise.

## 2016-10-24 NOTE — ED Provider Notes (Signed)
[facility] Emergency Department Provider Note    First MD Initiated Contact with Patient 10/24/16 1559     (approximate)  I have reviewed the triage vital signs and the nursing notes.   HISTORY  Chief Complaint Shortness of Breath    HPI Angela [name] is a 66 y.o. female with a history of bipolar disorder and anxiety and recent diagnosis of scabies and put on permethrin cream presents with "permethrin toxicity ". Patient states that she put the cream on 2 days ago and left it on. She is currently staying at the area, large and states that she did not take a shower. Started feeling shortness of breath and tingling all over so she called poison control. Poison control told her to come to the ER due to her shortness of breath. Patient denies any SI or HI. I spoke with poison control center who state that she is also called them multiple other times over the past week including yesterday due to concern for naproxen overdose that was accidental. She denies any other discomfort. Patient is very anxious appearing.   Past Medical History:  Diagnosis Date  . Allergic rhinitis   . Anemia   . Bipolar 1 disorder ([place])   . Blurred vision   . Bronchitis   . Chronic constipation   . Chronic sinusitis   . Depression   . Diverticulitis   . Diverticulosis   . Dizziness   . Endometriosis   . Endometriosis   . Facial pain   . Falls   . GERD (gastroesophageal reflux disease)   . GERD (gastroesophageal reflux disease)   . Hernia, hiatal   . Hypertension   . Hypothyroidism   . Malignant neoplasm of skin   . Migraine   . Neuropathy   . Otalgia   . Otitis media   . skin cancer   . Thyroid disease    Family History  Problem Relation Age of Onset  . Heart disease Father   . Cancer Mother        lung  . Urolithiasis Neg Hx   . Kidney disease Neg Hx   . Kidney cancer Neg Hx   . Prostate cancer Neg Hx    Past Surgical History:  Procedure Laterality Date    . APPENDECTOMY    . CHOLECYSTECTOMY    . CHOLECYSTECTOMY, LAPAROSCOPIC    . ESOPHAGOGASTRODUODENOSCOPY (EGD) WITH PROPOFOL N/A 03/29/2015   Procedure: ESOPHAGOGASTRODUODENOSCOPY (EGD) WITH PROPOFOL;  Surgeon: Josefine Class, MD;  Location: [facility] ENDOSCOPY;  Service: Endoscopy;  Laterality: N/A;  . HERNIA REPAIR    . laproscopy    . TONSILLECTOMY    . uteral suspension     Patient Active Problem List   Diagnosis Date Noted  . Memory loss 03/30/2016  . Gait abnormality 03/30/2016  . Mood disorder ([place]) 03/30/2016  . Personality disorder in adult [facility]) 01/24/2016  . Absolute anemia 06/24/2015  . Clinical depression 06/24/2015  . Endometriosis 06/24/2015  . BP (high blood pressure) 06/24/2015  . Headache, migraine 06/24/2015  . Disease of thyroid gland 06/24/2015  . PMB (postmenopausal bleeding) 05/22/2015  . Bipolar 1 disorder ([place]) 04/23/2015  . Anxiety state 04/23/2015  . Colitis, acute 04/20/2015  . Melena 04/20/2015  . Accelerated hypertension 04/20/2015  . TIA (transient ischemic attack) 04/20/2015  . Hypothyroidism 04/20/2015  . GERD (gastroesophageal reflux disease) 04/20/2015  . Chest pain 04/20/2015  . Acute colitis 04/20/2015  . Cervical disc disorder with radiculopathy 03/25/2015  . Cervical  pain 03/25/2015  . Pelvic pain in female 10/30/2014  . Bipolar affective disorder ([place]) 06/30/2014  . CA of skin 06/30/2014  . Cephalalgia 12/16/2013  . Neuropathy 12/16/2013      Prior to Admission medications   Medication Sig Start Date End Date Taking? Authorizing Provider  amLODipine (NORVASC) 2.5 MG tablet Take 2.5 mg by mouth daily.    [provider]  Biotin 5000 MCG CAPS Take 1 capsule by mouth daily.     [provider]  calcium carbonate (OS-CAL) 600 MG TABS tablet Take 600 mg by mouth 2 (two) times daily with a meal.     [provider]  conjugated estrogens (PREMARIN) vaginal cream Place 1 Applicatorful vaginally daily. 07/20/16    Rubie Maid, MD  divalproex (DEPAKOTE) 250 MG DR tablet Take 1 tablet (250 mg total) by mouth at bedtime. 01/25/16   Clapacs, Madie Reno, MD  estradiol (ESTRING) 2 MG vaginal ring Place 2 mg vaginally every 3 (three) months. follow package directions 07/20/16   Rubie Maid, MD  ferrous sulfate (FERROUSUL) 325 (65 FE) MG tablet Take 1 tablet (325 mg total) by mouth daily with breakfast. 07/20/16   Rubie Maid, MD  lactose free nutrition (BOOST PLUS) LIQD Take 237 mLs by mouth daily. 07/20/16   Rubie Maid, MD  levothyroxine (SYNTHROID, LEVOTHROID) 88 MCG tablet Take 88 mcg by mouth daily before breakfast. Reported on 06/24/2015    [provider]  Linaclotide (LINZESS) 290 MCG CAPS capsule Take 290 mcg by mouth daily.    [provider]  pantoprazole (PROTONIX) 40 MG tablet Take 1 tablet (40 mg total) by mouth 2 (two) times daily before a meal. 04/26/15   Demetrios Loll, MD  PARoxetine (PAXIL) 20 MG tablet Take 1 tablet (20 mg total) by mouth daily. 01/26/16   Clapacs, Madie Reno, MD  PARoxetine (PAXIL) 30 MG tablet Take 40 mg by mouth daily.     [provider]  ranitidine (CVS RANITIDINE) 75 MG tablet Take by mouth.    [provider]  tiZANidine (ZANAFLEX) 4 MG tablet Take 4 mg by mouth every 6 (six) hours as needed for muscle spasms.    [provider]  traZODone (DESYREL) 50 MG tablet Take 1 tablet (50 mg total) by mouth at bedtime. 01/25/16   Clapacs, Madie Reno, MD  zolpidem (AMBIEN) 10 MG tablet Take 10 mg by mouth at bedtime as needed for sleep.    [provider]    Allergies Bee venom; Darvon [propoxyphene]; Dicyclomine; Other; Oxycodone-acetaminophen; Peanuts [peanut oil]; Penicillins; Percocet [oxycodone-acetaminophen]; Sulfa antibiotics; Sulfacetamide sodium; Ultram [tramadol]; Amikacin; Aspirin; Bee pollen; Doxycycline; Haloperidol; Hymenoptera venom preparations; Imipramine; Keflex [cephalexin]; Sulfur; Adhesive [tape]; Hydroxyzine; and  Prednisone    Social History Social History  Substance Use Topics  . Smoking status: Former Research scientist (life sciences)  . Smokeless tobacco: Never Used  . Alcohol use No    Review of Systems Patient denies headaches, rhinorrhea, blurry vision, numbness, shortness of breath, chest pain, edema, cough, abdominal pain, nausea, vomiting, diarrhea, dysuria, fevers, rashes or hallucinations unless otherwise stated above in HPI. ____________________________________________   PHYSICAL EXAM:  VITAL SIGNS: Vitals:   10/24/16 1604  BP: (!) 159/86  Pulse: 67  Resp: 20  Temp: 98.6 F (37 C)  SpO2: 100%    Constitutional: Alert and oriented.anxious appearing in NAD Eyes: Conjunctivae are normal.  Head: Atraumatic. Nose: No congestion/rhinnorhea. Mouth/Throat: Mucous membranes are moist.   Neck: No stridor. Painless ROM.  Cardiovascular: Normal rate, regular rhythm. Grossly  normal heart sounds.  Good peripheral circulation. Respiratory: Normal respiratory effort.  No retractions. Lungs CTAB. Gastrointestinal: Soft and nontender. No distention. No abdominal bruits. No CVA tenderness. Musculoskeletal: No lower extremity tenderness nor edema.  No joint effusions. Neurologic:  Normal speech and language. No gross focal neurologic deficits are appreciated. No facial droop Skin:  Skin is warm, dry and intact. No rash noted. Psychiatric: anxious appearing, no SI or HI  ____________________________________________   LABS (all labs ordered are listed, but only abnormal results are displayed)  No results found for this or any previous visit (from the past 24 hour(s)). ____________________________________________  EKG My review and personal interpretation at Time: 16:15   Indication: exposure  Rate: 70  Rhythm: sinus Axis: normal Other: no stemi, normal intervals ____________________________________________  RADIOLOGY  I personally reviewed all radiographic images ordered to evaluate for the above acute  complaints and reviewed radiology reports and findings.  These findings were personally discussed with the patient.  Please see medical record for radiology report.  ____________________________________________   PROCEDURES  Procedure(s) performed:  Procedures    Critical Care performed: no ____________________________________________   INITIAL IMPRESSION / ASSESSMENT AND PLAN / ED COURSE  Pertinent labs & imaging results that were available during my care of the patient were reviewed by me and considered in my medical decision making (see chart for details).  DDX: anxiety, overdose, poisoning, toxic exposure, acs, asthma, copd  Angela [name] is a 66 y.o. who presents to the ED with symptoms as described above. Patient appears very anxious. No respiratory distress. She has no hypoxia and she has good breath sounds bilaterally. Per urethra and toxicity does not seem clinically likely. She denies any SI or HI. We'll give Ativan as I do suspect this is primarily anxiety attack. We'll observe patient and check blood work for the above differential.  The patient will be placed on continuous pulse oximetry and telemetry for monitoring.  Laboratory evaluation will be sent to evaluate for the above complaints.     Clinical Course as of Oct 25 1918  Tue Oct 24, 2016  1748 Blood work is reassuring. We'll replete potassium with [place]. I spoke with poison control who states that there is no indication for further monitoring. She does appear hemodynamically stable. Patient requesting additional Ativan. I do suspect her symptoms are secondary to anxiety. Patient stable for further workup as an outpatient.  [PR]    Clinical Course User Index [PR] Merlyn Lot, MD     ____________________________________________   FINAL CLINICAL IMPRESSION(S) / ED DIAGNOSES  Final diagnoses:  Tingling  Paresthesia      NEW MEDICATIONS STARTED DURING THIS VISIT:  New Prescriptions   No  medications on file     Note:  This document was prepared using Dragon voice recognition software and may include unintentional dictation errors.    Merlyn Lot, MD 10/24/16 [place]

## 2016-10-24 NOTE — Telephone Encounter (Signed)
[facility]  Per [facility] pt will need an u/s first. Then if lining is stable AC will order meds for pt.

## 2016-10-24 NOTE — ED Notes (Signed)
Pt requesting different nurse, laura c. rn resumed care of pt.  Shelda Pal charge nurse aware.

## 2016-10-25 NOTE — Telephone Encounter (Signed)
Pt has had bleeding x 2days. Only when she wipes. Dark blood today. No uti sx. Informed pt of the need for u/s before meds will be rxed. 1st available 10/24 at 9am. Pt states she is homeless and does not have a calendar in front of her. She prefers to get a scan asap. She doesn't want to wait 2 weeks. Pt aware I will have to ask AC. Pls advise.

## 2016-10-26 ENCOUNTER — Other Ambulatory Visit: Payer: Self-pay | Admitting: Obstetrics and Gynecology

## 2016-10-26 DIAGNOSIS — N950 Postmenopausal bleeding: Secondary | ICD-10-CM

## 2016-10-26 NOTE — Telephone Encounter (Signed)
She can have an ultrasound at the hospital to expedite things if she prefers.

## 2016-10-26 NOTE — Telephone Encounter (Signed)
Pt has a u/s here on 10/24 for PMB(post menopausal bleeding). She prefers to have it at [place], if she can get in sooner. Will you please schedule her a sooner appt. at [place]? I will contact her once the appointment has been made. ty

## 2016-10-28 ENCOUNTER — Encounter (HOSPITAL_COMMUNITY): Payer: Self-pay | Admitting: Emergency Medicine

## 2016-10-28 ENCOUNTER — Emergency Department (HOSPITAL_COMMUNITY)
Admission: EM | Admit: 2016-10-28 | Discharge: 2016-10-28 | Disposition: A | Discharge status: Home / Self Care | Payer: Medicare Other | Attending: Emergency Medicine | Admitting: Emergency Medicine

## 2016-10-28 DIAGNOSIS — Z79899 Other long term (current) drug therapy: Secondary | ICD-10-CM | POA: Insufficient documentation

## 2016-10-28 DIAGNOSIS — R531 Weakness: Secondary | ICD-10-CM | POA: Diagnosis not present

## 2016-10-28 DIAGNOSIS — I10 Essential (primary) hypertension: Secondary | ICD-10-CM | POA: Diagnosis not present

## 2016-10-28 DIAGNOSIS — Z87891 Personal history of nicotine dependence: Secondary | ICD-10-CM | POA: Diagnosis not present

## 2016-10-28 DIAGNOSIS — R5383 Other fatigue: Secondary | ICD-10-CM | POA: Diagnosis present

## 2016-10-28 DIAGNOSIS — Z8673 Personal history of transient ischemic attack (TIA), and cerebral infarction without residual deficits: Secondary | ICD-10-CM | POA: Insufficient documentation

## 2016-10-28 DIAGNOSIS — D649 Anemia, unspecified: Secondary | ICD-10-CM | POA: Diagnosis not present

## 2016-10-28 DIAGNOSIS — E039 Hypothyroidism, unspecified: Secondary | ICD-10-CM | POA: Diagnosis not present

## 2016-10-28 DIAGNOSIS — D508 Other iron deficiency anemias: Secondary | ICD-10-CM

## 2016-10-28 LAB — COMPREHENSIVE METABOLIC PANEL
ALK PHOS: 89 U/L (ref 38–126)
ALT: 25 U/L (ref 14–54)
AST: 27 U/L (ref 15–41)
Albumin: 3.7 g/dL (ref 3.5–5.0)
Anion gap: 7 (ref 5–15)
BUN: 10 mg/dL (ref 6–20)
CALCIUM: 8.6 mg/dL — AB (ref 8.9–10.3)
CHLORIDE: 105 mmol/L (ref 101–111)
CO2: 20 mmol/L — ABNORMAL LOW (ref 22–32)
CREATININE: 0.54 mg/dL (ref 0.44–1.00)
GFR calc Af Amer: >60 mL/min (ref >60)
Glucose, Bld: 82 mg/dL (ref 65–99)
Potassium: 4.1 mmol/L (ref 3.5–5.1)
Sodium: 132 mmol/L — ABNORMAL LOW (ref 135–145)
Total Bilirubin: 0.3 mg/dL (ref 0.3–1.2)
Total Protein: 6.6 g/dL (ref 6.5–8.1)

## 2016-10-28 LAB — CBC WITH DIFFERENTIAL/PLATELET
BASOS ABS: 0 10*3/uL (ref 0.0–0.1)
Basophils Relative: 0 %
EOS PCT: 1 %
Eosinophils Absolute: 0.1 10*3/uL (ref 0.0–0.7)
HEMATOCRIT: 36.3 % (ref 36.0–46.0)
HEMOGLOBIN: 12.6 g/dL (ref 12.0–15.0)
LYMPHS ABS: 1.7 10*3/uL (ref 0.7–4.0)
LYMPHS PCT: 24 %
MCH: 31.6 pg (ref 26.0–34.0)
MCHC: 34.7 g/dL (ref 30.0–36.0)
MCV: 91 fL (ref 78.0–100.0)
Monocytes Absolute: 0.7 10*3/uL (ref 0.1–1.0)
Monocytes Relative: 10 %
NEUTROS ABS: 4.3 10*3/uL (ref 1.7–7.7)
NEUTROS PCT: 65 %
PLATELETS: 350 10*3/uL (ref 150–400)
RBC: 3.99 MIL/uL (ref 3.87–5.11)
RDW: 14.2 % (ref 11.5–15.5)
WBC: 6.8 10*3/uL (ref 4.0–10.5)

## 2016-10-28 LAB — URINALYSIS, ROUTINE W REFLEX MICROSCOPIC
Bilirubin Urine: NEGATIVE
GLUCOSE, UA: NEGATIVE mg/dL
HGB URINE DIPSTICK: NEGATIVE
Ketones, ur: NEGATIVE mg/dL
Leukocytes, UA: NEGATIVE
Nitrite: NEGATIVE
Protein, ur: NEGATIVE mg/dL
SPECIFIC GRAVITY, URINE: 1.004 — AB (ref 1.005–1.030)
pH: 8 (ref 5.0–8.0)

## 2016-10-28 LAB — TSH: TSH: 5.295 u[IU]/mL — AB (ref 0.350–4.500)

## 2016-10-28 MED ORDER — FERROUS SULFATE 325 (65 FE) MG PO TABS: 325.0000 mg | ORAL_TABLET | Freq: Every day | ORAL | 1 refills | Status: DC

## 2016-10-28 MED ORDER — GABAPENTIN 100 MG PO CAPS: 100.0000 mg | ORAL_CAPSULE | Freq: Three times a day (TID) | ORAL | 0 refills | Status: DC

## 2016-10-28 MED ORDER — LEVOTHYROXINE SODIUM 88 MCG PO TABS: 88.0000 ug | ORAL_TABLET | Freq: Every day | ORAL | 0 refills | Status: DC

## 2016-10-28 MED ORDER — CALCIUM CARBONATE 600 MG PO TABS: 600.0000 mg | ORAL_TABLET | Freq: Two times a day (BID) | ORAL | 0 refills | Status: DC

## 2016-10-28 MED ORDER — AMLODIPINE BESYLATE 2.5 MG PO TABS: 5.0000 mg | ORAL_TABLET | ORAL | 0 refills | Status: DC

## 2016-10-28 MED ORDER — ACETAMINOPHEN 325 MG PO TABS
650.0000 mg | ORAL_TABLET | Freq: Once | ORAL | Status: AC
  Administered 2016-10-28: 650 mg via ORAL
  Filled 2016-10-28: qty 2

## 2016-10-28 MED ORDER — SODIUM CHLORIDE 0.9 % IV BOLUS (SEPSIS)
500.0000 mL | Freq: Once | INTRAVENOUS | Status: AC
  Administered 2016-10-28: 500 mL via INTRAVENOUS

## 2016-10-28 MED ORDER — POLYETHYLENE GLYCOL 3350 17 G PO PACK: 17.0000 g | PACK | Freq: Every day | ORAL | 0 refills | Status: DC

## 2016-10-28 NOTE — ED Triage Notes (Signed)
Pt to ER with complaint of three days of fatigue. States has been out of medications  -synthroid, BP meds, etc for one month. States both ankles and wrists are sprained. Complaining of lower back pain as well. States is homeless. States brother died this morning. Pt tearful.

## 2016-10-28 NOTE — Discharge Instructions (Signed)
Please read and follow all provided instructions.  Your diagnoses today include:  1. Generalized weakness   2. Hypothyroidism, unspecified type   3. Dietary iron deficiency anemia     Tests performed today include: Vital signs. See below for your results today.   Medications prescribed:  Take as prescribed   Home care instructions:  Follow any educational materials contained in this packet.  Follow-up instructions: Please follow-up with your primary care provider for further evaluation of symptoms and treatment   Return instructions:  Please return to the Emergency Department if you do not get better, if you get worse, or new symptoms OR  - Fever (temperature greater than 101.60F)  - Bleeding that does not stop with holding pressure to the area    -Severe pain (please note that you may be more sore the day after your accident)  - Chest Pain  - Difficulty breathing  - Severe nausea or vomiting  - Inability to tolerate food and liquids  - Passing out  - Skin becoming red around your wounds  - Change in mental status (confusion or lethargy)  - New numbness or weakness    Please return if you have any other emergent concerns.  Additional Information:  Your vital signs today were: BP 140/89 (BP Location: Right Arm)    Pulse 60    Temp 98.3 F (36.8 C) (Oral)    Resp (!) 9    Ht 5\' 3"  (1.6 m)    Wt 59 kg (130 lb)    SpO2 100%    BMI 23.03 kg/m  If your blood pressure (BP) was elevated above 135/85 this visit, please have this repeated by your doctor within one month. ---------------

## 2016-10-28 NOTE — ED Notes (Signed)
Purewick placed on patient.

## 2016-10-28 NOTE — ED Provider Notes (Signed)
[facility] DEPT Provider Note   CSN: [id number] Arrival date & time: 10/28/16  1244     History   Chief Complaint Chief Complaint  Patient presents with  . Fatigue    HPI Angela [name] is a 66 y.o. female.  The history is provided by the patient and medical records. No language interpreter was used.   Angela [name] is a 66 y.o. female  with a PMH of bipolar disorder, hypothyroid, HTN who presents to the Emergency Department complaining of "pain all over" and "dehydration" over the last 3 days. She states that she fell about a month ago and sprained both of her wrists and ankles which are bothering her as well. She tells me she is sad and also here because she has "a broken heart". She also states that she has not been getting the nutrition that she needs, as she is homeless, therefore feels malnourished and dehydrated. Patient states that she has been out of several of her medications. She initially told nursing staff that she had been out of her medication for a month. When I asked the last time she had taken her blood pressure medication, she states that she took it this morning, but now she is out. She did inform me that she is out of her calcium pills. No fever, chills, abdominal pain, nausea, vomiting, blood in stool, chest pain or trouble breathing.  Past Medical History:  Diagnosis Date  . Allergic rhinitis   . Anemia   . Bipolar 1 disorder ([place])   . Blurred vision   . Bronchitis   . Chronic constipation   . Chronic sinusitis   . Depression   . Diverticulitis   . Diverticulosis   . Dizziness   . Endometriosis   . Endometriosis   . Facial pain   . Falls   . GERD (gastroesophageal reflux disease)   . GERD (gastroesophageal reflux disease)   . Hernia, hiatal   . Hypertension   . Hypothyroidism   . Malignant neoplasm of skin   . Migraine   . Neuropathy   . Otalgia   . Otitis media   . skin cancer   . Thyroid disease     Patient Active Problem List   Diagnosis Date Noted  . Memory loss 03/30/2016  . Gait abnormality 03/30/2016  . Mood disorder ([place]) 03/30/2016  . Personality disorder in adult [facility]) 01/24/2016  . Absolute anemia 06/24/2015  . Clinical depression 06/24/2015  . Endometriosis 06/24/2015  . BP (high blood pressure) 06/24/2015  . Headache, migraine 06/24/2015  . Disease of thyroid gland 06/24/2015  . PMB (postmenopausal bleeding) 05/22/2015  . Bipolar 1 disorder ([place]) 04/23/2015  . Anxiety state 04/23/2015  . Colitis, acute 04/20/2015  . Melena 04/20/2015  . Accelerated hypertension 04/20/2015  . TIA (transient ischemic attack) 04/20/2015  . Hypothyroidism 04/20/2015  . GERD (gastroesophageal reflux disease) 04/20/2015  . Chest pain 04/20/2015  . Acute colitis 04/20/2015  . Cervical disc disorder with radiculopathy 03/25/2015  . Cervical pain 03/25/2015  . Pelvic pain in female 10/30/2014  . Bipolar affective disorder ([place]) 06/30/2014  . CA of skin 06/30/2014  . Cephalalgia 12/16/2013  . Neuropathy 12/16/2013    Past Surgical History:  Procedure Laterality Date  . APPENDECTOMY    . CHOLECYSTECTOMY    . CHOLECYSTECTOMY, LAPAROSCOPIC    . ESOPHAGOGASTRODUODENOSCOPY (EGD) WITH PROPOFOL N/A 03/29/2015   Procedure: ESOPHAGOGASTRODUODENOSCOPY (EGD) WITH PROPOFOL;  Surgeon: Josefine Class, MD;  Location[facility] ENDOSCOPY;  Service: Endoscopy;  Laterality:  N/A;  . HERNIA REPAIR    . laproscopy    . TONSILLECTOMY    . uteral suspension      OB History    Gravida Para Term Preterm AB Living   1       1 0   SAB TAB Ectopic Multiple Live Births     1             Home Medications    Prior to Admission medications   Medication Sig Start Date End Date Taking? Authorizing Provider  amLODipine (NORVASC) 2.5 MG tablet Take 2.5 mg by mouth daily.    [provider]  Biotin 5000 MCG CAPS Take 1 capsule by mouth daily.     [provider]  calcium carbonate (OS-CAL) 600 MG TABS tablet Take 600  mg by mouth 2 (two) times daily with a meal.     [provider]  conjugated estrogens (PREMARIN) vaginal cream Place 1 Applicatorful vaginally daily. 07/20/16   Rubie Maid, MD  divalproex (DEPAKOTE) 250 MG DR tablet Take 1 tablet (250 mg total) by mouth at bedtime. 01/25/16   Clapacs, Madie Reno, MD  estradiol (ESTRING) 2 MG vaginal ring Place 2 mg vaginally every 3 (three) months. follow package directions 07/20/16   Rubie Maid, MD  ferrous sulfate (FERROUSUL) 325 (65 FE) MG tablet Take 1 tablet (325 mg total) by mouth daily with breakfast. 07/20/16   Rubie Maid, MD  lactose free nutrition (BOOST PLUS) LIQD Take 237 mLs by mouth daily. 07/20/16   Rubie Maid, MD  levothyroxine (SYNTHROID, LEVOTHROID) 88 MCG tablet Take 88 mcg by mouth daily before breakfast. Reported on 06/24/2015    [provider]  Linaclotide (LINZESS) 290 MCG CAPS capsule Take 290 mcg by mouth daily.    [provider]  pantoprazole (PROTONIX) 40 MG tablet Take 1 tablet (40 mg total) by mouth 2 (two) times daily before a meal. 04/26/15   Demetrios Loll, MD  PARoxetine (PAXIL) 20 MG tablet Take 1 tablet (20 mg total) by mouth daily. 01/26/16   Clapacs, Madie Reno, MD  PARoxetine (PAXIL) 30 MG tablet Take 40 mg by mouth daily.     [provider]  ranitidine ([employer] RANITIDINE) 75 MG tablet Take by mouth.    [provider]  tiZANidine (ZANAFLEX) 4 MG tablet Take 4 mg by mouth every 6 (six) hours as needed for muscle spasms.    [provider]  traZODone (DESYREL) 50 MG tablet Take 1 tablet (50 mg total) by mouth at bedtime. 01/25/16   Clapacs, Madie Reno, MD  zolpidem (AMBIEN) 10 MG tablet Take 10 mg by mouth at bedtime as needed for sleep.    [provider]    Family History Family History  Problem Relation Age of Onset  . Heart disease Father   . Cancer Mother        lung  . Urolithiasis Neg Hx   . Kidney disease Neg Hx   . Kidney cancer Neg Hx   . Prostate cancer Neg Hx      Social History Social History  Substance Use Topics  . Smoking status: Former Research scientist (life sciences)  . Smokeless tobacco: Never Used  . Alcohol use No     Allergies   Bee venom; Darvon [propoxyphene]; Dicyclomine; Other; Oxycodone-acetaminophen; Peanuts [peanut oil]; Penicillins; Percocet [oxycodone-acetaminophen]; Sulfa antibiotics; Sulfacetamide sodium; Ultram [tramadol]; Amikacin; Aspirin; Bee pollen; Doxycycline; Haloperidol; Hymenoptera venom preparations; Imipramine; Keflex [cephalexin]; Sulfur; Adhesive [tape]; Hydroxyzine; and Prednisone   Review  of Systems Review of Systems  Constitutional: Positive for appetite change and fatigue.  Neurological: Positive for weakness. Negative for syncope, numbness and headaches.  All other systems reviewed and are negative.    Physical Exam Updated Vital Signs BP 140/89 (BP Location: Right Arm)   Pulse 60   Temp 98.3 F (36.8 C) (Oral)   Resp (!) 9   Ht 5\' 3"  (1.6 m)   Wt 59 kg (130 lb)   SpO2 100%   BMI 23.03 kg/m   Physical Exam  Constitutional: She is oriented to person, place, and time. She appears well-developed and well-nourished. No distress.  HENT:  Head: Normocephalic and atraumatic.  Cardiovascular: Normal rate, regular rhythm and normal heart sounds.   No murmur heard. Pulmonary/Chest: Effort normal and breath sounds normal. No respiratory distress. She has no wheezes. She has no rales. She exhibits no tenderness.  Abdominal: Soft. She exhibits no distension. There is no tenderness.  Musculoskeletal: Normal range of motion.  Neurological: She is alert and oriented to person, place, and time.  Skin: Skin is warm and dry. Capillary refill takes less than 2 seconds.  Nursing note and vitals reviewed.    ED Treatments / Results  Labs (all labs ordered are listed, but only abnormal results are displayed) Labs Reviewed  CBC WITH DIFFERENTIAL/PLATELET  COMPREHENSIVE METABOLIC PANEL  TSH  URINALYSIS, ROUTINE W REFLEX  MICROSCOPIC    EKG  EKG Interpretation None       Radiology No results found.  Procedures Procedures (including critical care time)  Medications Ordered in ED Medications  sodium chloride 0.9 % bolus 500 mL (not administered)  acetaminophen (TYLENOL) tablet 650 mg (not administered)     Initial Impression / Assessment and Plan / ED Course  I have reviewed the triage vital signs and the nursing notes.  Pertinent labs & imaging results that were available during my care of the patient were reviewed by me and considered in my medical decision making (see chart for details).    Angela [name] is a 66 y.o. female who presents to ED for weakness and feeling dehydrated. She does have history of hyponatremia and feels as if her sodium is off. On exam, patient is afebrile, hemodynamically stable with reassuring exam. UA reassuring without signs of infection or dehydration. Lab work pending at shift change. Care assumed by oncoming provider PA Mohr. Case discussed, plan agreed upon. Will follow up on pending lab work and dispo appropriately.     Final Clinical Impressions(s) / ED Diagnoses   Final diagnoses:  None    New Prescriptions New Prescriptions   No medications on file     Marshal Eskew, Ozella Almond, PA-C 10/28/16 1615    Blanchie Dessert, MD 10/29/16 1941

## 2016-10-28 NOTE — ED Provider Notes (Signed)
  Physical Exam  BP 140/89 (BP Location: Right Arm)   Pulse 60   Temp 98.3 F (36.8 C) (Oral)   Resp (!) 9   Ht 5\' 3"  (1.6 m)   Wt 59 kg (130 lb)   SpO2 100%   BMI 23.03 kg/m   Physical Exam  ED Course  Procedures  4:10 PM Sign out from [facility], PA-C  Per previous provider HPI Angela [name] is a 66 y.o. female with a history of bipolar disorder and anxiety and recent diagnosis of scabies and put on permethrin cream presents with "permethrin toxicity ". Patient states that she put the cream on 2 days ago and left it on. She is currently staying at the area, large and states that she did not take a shower. Started feeling shortness of breath and tingling all over so she called poison control. Poison control told her to come to the ER due to her shortness of breath. Patient denies any SI or HI. I spoke with poison control center who state that she is also called them multiple other times over the past week including yesterday due to concern for naproxen overdose that was accidental. She denies any other discomfort. Patient is very anxious appearing.  5:51 PM CBC/CMP unremarkable. UA unremarkable. TSH elevated. Will Rx Synthroid. Pt has taken in past and ran out.  Discussed results with patient Will DC patient home with follow up to PCP. At time of discharge, Patient is in no acute distress. Vital Signs are stable. Patient is able to ambulate. Patient able to tolerate PO.       Shary Decamp, PA-C 10/28/16 1755    Blanchie Dessert, MD 10/29/16 [phone]

## 2016-10-30 ENCOUNTER — Other Ambulatory Visit: Payer: Self-pay

## 2016-10-30 DIAGNOSIS — N950 Postmenopausal bleeding: Secondary | ICD-10-CM

## 2016-11-03 ENCOUNTER — Ambulatory Visit
Admission: RE | Admit: 2016-11-03 | Discharge: 2016-11-03 | Disposition: A | Discharge status: Home / Self Care | Payer: Medicare Other | Source: Ambulatory Visit | Attending: Obstetrics and Gynecology | Admitting: Obstetrics and Gynecology

## 2016-11-03 DIAGNOSIS — N950 Postmenopausal bleeding: Secondary | ICD-10-CM

## 2016-11-03 DIAGNOSIS — N83291 Other ovarian cyst, right side: Secondary | ICD-10-CM | POA: Insufficient documentation

## 2016-11-06 ENCOUNTER — Telehealth: Payer: Self-pay

## 2016-11-06 MED ORDER — NORETHINDRONE ACETATE 5 MG PO TABS: 5.0000 mg | ORAL_TABLET | Freq: Every day | ORAL | 0 refills | Status: DC

## 2016-11-06 NOTE — Telephone Encounter (Signed)
-----   Message from Rubie Maid, MD sent at 11/06/2016  8:50 AM EDT ----- Please inform Ms. Myhand that her ultrasound showed a thin endometrial lining, bleeding was most likely due to atrophy.  She can receive her progesterone (Aygestin 5 mg daily x 2 weeks).  Her ultrasound also showed a simple right sided cyst, which usually will go away on it's own without intervention, but may occasionally cause mild twinges of pain which can be managed with OTC meds (Ibuprofen, Tylenol).    Also, she has a small collection of fluid in her right fallopian tube in a short segment, which will also usually goes away without intervention.   Dr. Marcelline Mates

## 2016-11-06 NOTE — Telephone Encounter (Signed)
Pt aware. Meds erx.

## 2016-11-08 ENCOUNTER — Other Ambulatory Visit: Payer: Self-pay

## 2016-11-08 ENCOUNTER — Ambulatory Visit: Payer: Medicare Other | Admitting: Family Medicine

## 2016-11-09 ENCOUNTER — Ambulatory Visit: Payer: Medicare Other | Admitting: Podiatry

## 2016-11-09 ENCOUNTER — Encounter: Payer: Self-pay | Admitting: Podiatry

## 2016-11-09 ENCOUNTER — Ambulatory Visit (INDEPENDENT_AMBULATORY_CARE_PROVIDER_SITE_OTHER): Payer: Medicare Other | Admitting: Podiatry

## 2016-11-09 DIAGNOSIS — M2042 Other hammer toe(s) (acquired), left foot: Secondary | ICD-10-CM

## 2016-11-09 DIAGNOSIS — B351 Tinea unguium: Secondary | ICD-10-CM | POA: Diagnosis not present

## 2016-11-09 DIAGNOSIS — M2010 Hallux valgus (acquired), unspecified foot: Secondary | ICD-10-CM

## 2016-11-09 DIAGNOSIS — M79676 Pain in unspecified toe(s): Secondary | ICD-10-CM | POA: Diagnosis not present

## 2016-11-09 DIAGNOSIS — M2041 Other hammer toe(s) (acquired), right foot: Secondary | ICD-10-CM

## 2016-11-09 NOTE — Progress Notes (Addendum)
Complaint:  Visit Type: Patient returns to my office for continued preventative foot care services. Complaint: Patient states" my nails have grown long and thick and become painful to walk and wear shoes"  The patient presents for preventative foot care services. No changes to ROS  Podiatric Exam: Vascular: dorsalis pedis and posterior tibial pulses are palpable bilateral. Capillary return is immediate. Temperature gradient is WNL. Skin turgor WNL  Sensorium: Normal Semmes Weinstein monofilament test. Normal tactile sensation bilaterally. Nail Exam: Pt has thick disfigured discolored nails with subungual debris noted bilateral entire nail hallux through fifth toenails Ulcer Exam: There is no evidence of ulcer or pre-ulcerative changes or infection. Orthopedic Exam: Muscle tone and strength are WNL. No limitations in general ROM. No crepitus or effusions noted. Foot type and digits show no abnormalities. HAV  B/L with hammer toes 2-5  B/L Skin: No Porokeratosis. No infection or ulcers  Diagnosis:  Onychomycosis, , Pain in right toe, pain in left toes  Treatment & Plan Procedures and Treatment: Consent by patient was obtained for treatment procedures.   Debridement of mycotic and hypertrophic toenails, 1 through 5 bilateral and clearing of subungual debris. No ulceration, no infection noted. ABN signed for 2018. Return Visit-Office Procedure: Patient instructed to return to the office for a follow up visit 3 months for continued evaluation and treatment.    Gardiner Barefoot DPM

## 2016-11-14 ENCOUNTER — Ambulatory Visit: Payer: Medicare Other | Admitting: Podiatry

## 2016-11-14 ENCOUNTER — Ambulatory Visit (INDEPENDENT_AMBULATORY_CARE_PROVIDER_SITE_OTHER): Payer: Medicare Other | Admitting: Podiatry

## 2016-11-14 DIAGNOSIS — M722 Plantar fascial fibromatosis: Secondary | ICD-10-CM | POA: Diagnosis not present

## 2016-11-14 MED ORDER — NONFORMULARY OR COMPOUNDED ITEM: 2 refills | Status: DC

## 2016-11-14 MED ORDER — BETAMETHASONE SOD PHOS & ACET 6 (3-3) MG/ML IJ SUSP: 3.0000 mg | Freq: Once | INTRAMUSCULAR | Status: DC

## 2016-11-16 NOTE — Progress Notes (Signed)
   HPI: 66 year old female presents to the office today for follow up evaluation of bilateral plantar fasciitis. She reports continued significant pain. Wearing the plantar fascial brace helps alleviate the pain. She is here for further evaluation and treatment.   Past Medical History:  Diagnosis Date  . Allergic rhinitis   . Anemia   . Bipolar 1 disorder ([place])   . Blurred vision   . Bronchitis   . Chronic constipation   . Chronic sinusitis   . Depression   . Diverticulitis   . Diverticulosis   . Dizziness   . Endometriosis   . Endometriosis   . Facial pain   . Falls   . GERD (gastroesophageal reflux disease)   . GERD (gastroesophageal reflux disease)   . Hernia, hiatal   . Hypertension   . Hypothyroidism   . Malignant neoplasm of skin   . Migraine   . Neuropathy   . Otalgia   . Otitis media   . skin cancer   . Thyroid disease       Physical Exam: General: The patient is alert and oriented x3 in no acute distress.  Dermatology: Hyperkeratotic dystrophic nail noted to the right great toenail. Skin is warm, dry and supple bilateral lower extremities. Negative for open lesions or macerations.  Vascular: Palpable pedal pulses bilaterally. No edema or erythema noted. Capillary refill within normal limits.  Neurological: Epicritic and protective threshold grossly intact bilaterally.   Musculoskeletal Exam: Hammertoe contracture deformity digits 2-5 of the bilateral feet noted. Range of motion within normal limits to all pedal and ankle joints bilateral. Muscle strength 5/5 in all groups bilateral. There is some pain on palpation to the medial calcaneal tubercle of the bilateral heels consistent with plantar fasciitis.  Assessment: 1. Plantar fasciitis bilaterally  Plan of Care:  1. Patient was evaluated. 2. Injection of 0.5 mL Celestone Soluspan injected in the patient's bilateral plantar fascial heels.  3. Continue wearing fascial braces and new balance shoes. 4.  Compression anklets dispensed bilaterally. 5. Return to clinic when necessary.  Edrick Kins, DPM [facility]  Dr. Edrick Kins, DPM    [address], [place] [address]                Office [phone]  Fax [phone]

## 2016-11-17 ENCOUNTER — Ambulatory Visit (INDEPENDENT_AMBULATORY_CARE_PROVIDER_SITE_OTHER): Payer: Medicare Other | Admitting: Family Medicine

## 2016-11-17 ENCOUNTER — Encounter: Payer: Self-pay | Admitting: Family Medicine

## 2016-11-17 VITALS — BP 142/77 | HR 74 | Temp 98.7°F | Ht 63.0 in | Wt 131.0 lb

## 2016-11-17 DIAGNOSIS — E559 Vitamin D deficiency, unspecified: Secondary | ICD-10-CM | POA: Diagnosis not present

## 2016-11-17 DIAGNOSIS — G5603 Carpal tunnel syndrome, bilateral upper limbs: Secondary | ICD-10-CM | POA: Insufficient documentation

## 2016-11-17 DIAGNOSIS — I10 Essential (primary) hypertension: Secondary | ICD-10-CM | POA: Diagnosis not present

## 2016-11-17 DIAGNOSIS — K581 Irritable bowel syndrome with constipation: Secondary | ICD-10-CM | POA: Diagnosis not present

## 2016-11-17 DIAGNOSIS — E538 Deficiency of other specified B group vitamins: Secondary | ICD-10-CM | POA: Diagnosis not present

## 2016-11-17 DIAGNOSIS — F319 Bipolar disorder, unspecified: Secondary | ICD-10-CM | POA: Diagnosis not present

## 2016-11-17 DIAGNOSIS — B372 Candidiasis of skin and nail: Secondary | ICD-10-CM | POA: Diagnosis not present

## 2016-11-17 DIAGNOSIS — G3184 Mild cognitive impairment, so stated: Secondary | ICD-10-CM

## 2016-11-17 DIAGNOSIS — M722 Plantar fascial fibromatosis: Secondary | ICD-10-CM | POA: Insufficient documentation

## 2016-11-17 DIAGNOSIS — K219 Gastro-esophageal reflux disease without esophagitis: Secondary | ICD-10-CM | POA: Diagnosis not present

## 2016-11-17 DIAGNOSIS — M7918 Myalgia, other site: Secondary | ICD-10-CM | POA: Insufficient documentation

## 2016-11-17 DIAGNOSIS — M5010 Cervical disc disorder with radiculopathy, unspecified cervical region: Secondary | ICD-10-CM

## 2016-11-17 DIAGNOSIS — K449 Diaphragmatic hernia without obstruction or gangrene: Secondary | ICD-10-CM | POA: Insufficient documentation

## 2016-11-17 DIAGNOSIS — K589 Irritable bowel syndrome without diarrhea: Secondary | ICD-10-CM | POA: Insufficient documentation

## 2016-11-17 DIAGNOSIS — G894 Chronic pain syndrome: Secondary | ICD-10-CM

## 2016-11-17 DIAGNOSIS — E871 Hypo-osmolality and hyponatremia: Secondary | ICD-10-CM | POA: Insufficient documentation

## 2016-11-17 DIAGNOSIS — F4310 Post-traumatic stress disorder, unspecified: Secondary | ICD-10-CM | POA: Insufficient documentation

## 2016-11-17 DIAGNOSIS — E039 Hypothyroidism, unspecified: Secondary | ICD-10-CM

## 2016-11-17 MED ORDER — LEVOTHYROXINE SODIUM 100 MCG PO TABS: 100.0000 ug | ORAL_TABLET | Freq: Every day | ORAL | 1 refills | Status: DC

## 2016-11-17 MED ORDER — LINACLOTIDE 290 MCG PO CAPS
290.0000 ug | ORAL_CAPSULE | Freq: Every day | ORAL | 1 refills | Status: DC
Start: 2016-11-17 — End: 2016-12-13

## 2016-11-17 MED ORDER — CYCLOSPORINE 0.05 % OP EMUL: 1.0000 [drp] | Freq: Two times a day (BID) | OPHTHALMIC | 5 refills | Status: DC

## 2016-11-17 MED ORDER — CLOTRIMAZOLE-BETAMETHASONE 1-0.05 % EX CREA: TOPICAL_CREAM | CUTANEOUS | 1 refills | Status: DC

## 2016-11-17 MED ORDER — PANTOPRAZOLE SODIUM 40 MG PO TBEC: 40.0000 mg | DELAYED_RELEASE_TABLET | Freq: Every day | ORAL | 1 refills | Status: DC

## 2016-11-17 MED ORDER — GABAPENTIN 100 MG PO CAPS: 100.0000 mg | ORAL_CAPSULE | Freq: Three times a day (TID) | ORAL | 1 refills | Status: DC

## 2016-11-17 MED ORDER — POLYETHYLENE GLYCOL 3350 17 GM/SCOOP PO POWD: 17.0000 g | Freq: Every day | ORAL | 5 refills | Status: DC

## 2016-11-17 MED ORDER — AMLODIPINE BESYLATE 5 MG PO TABS: 5.0000 mg | ORAL_TABLET | Freq: Two times a day (BID) | ORAL | 1 refills | Status: DC

## 2016-11-17 MED ORDER — TIZANIDINE HCL 2 MG PO TABS: 1.0000 mg | ORAL_TABLET | Freq: Two times a day (BID) | ORAL | 1 refills | Status: DC | PRN

## 2016-11-17 NOTE — Assessment & Plan Note (Signed)
Multiple pain generators with chronic neck / back / headaches / carpal tunnel, OA/DJD and neuropathy - See A&P Continue Tizanidine, Gabapentin - reviewed higher doses hesitant to change today

## 2016-11-17 NOTE — Assessment & Plan Note (Addendum)
Stable chronic problem, mild hyponatremia Followed by [facility] End[facility] Dr Gabriel Carina in past, without concern thought SSRI vs SIADH, based on labs Now off SSRI Last Na 132 (10/28/16) Re-check labs today by request

## 2016-11-17 NOTE — Assessment & Plan Note (Signed)
Chronic neck pain secondary to C-spine DDD, spinal stenosis, radiculopathy Followed by [facility] in past S/p C spine MRI Currently on Tizanidine, Gabapentin to control symptoms with chronic pain

## 2016-11-17 NOTE — Patient Instructions (Addendum)
Thank you for coming to the clinic today.  1. Refilled meds as requested, printed, let me know if any concern  Labs ordered for [facility]  Please schedule a Follow-up Appointment to: Return in about 3 months (around 02/17/2017) for  Med refills / Carpal Tunnel / IBS / Thyroid.  If you have any other questions or concerns, please feel free to call the clinic or send a message through [place]. You may also schedule an earlier appointment if necessary.  Additionally, you may be receiving a survey about your experience at our clinic within a few days to 1 week by e-mail or mail. We value your feedback.  Nobie Putnam, DO [place]

## 2016-11-17 NOTE — Assessment & Plan Note (Addendum)
Chronic problem bilateral carpal tunnel seems to be debilitating for her with neuropathy, also concern related to C-spinal stenosis and radiculopathy. Limiting her function, by her report unable to grip and write.  Followed by [facility] Dr Peggye Ley and [facility] in past, s/p EMG, imaging [place], neck MRI On wrist supports, meds with Gabapentin, Tizanidine Has not had steroid injection or surgical consult yet Refilled Tizanidine and Gabapentin at current doses

## 2016-11-17 NOTE — Assessment & Plan Note (Signed)
Chronic problem with major depression vs affective disorder, primarily depressive symptoms and comorbid PTSD, insomnia, personality disorder - has been managed by variety of providers PCP, Psychiatry, Therapist. Has remote history in 88s of institutionalization for depression - Currently no longer followed by Psychiatry, her mood symptoms are still severe primarily with depression, but she feels overall better off of SSRI and Depakote, believed that Depakote was causing variety of side effects and intolerance - No suicidal or homicidal ideation - She is currently limited primarily by her social situation living in shelter, in past was at ALF  Plan: 1. Updated med list to reflect no longer on Paxil SSRI or Depakote - since new patient visit agree that not adjusting her psych meds at this time, agree to continue with Gabapentin for both pain and mood stabilization which is helping most, and re-consider future psychiatry / therapy as needed in future if recurrence or concerns. - Additionally given form today to sign to determine if she is capable of managing her own benefits/funds from social security or if she requires a representative payee, overall based on review of chart and prior cognitive and functional testing it appears she is mentally capable of managing her own funds, will complete her form and sign off, and it can be mailed appropriately next week. She has already completed her portion.

## 2016-11-17 NOTE — Assessment & Plan Note (Signed)
Chronic problem followed by [facility] with improvement On topical therapy, injections in past, and braces/supports

## 2016-11-17 NOTE — Assessment & Plan Note (Signed)
Stable chronic GERD with history of moderate to large hiatal hernia, also complicated by history of dysphagia - No GI red flag symptoms. Exam is unremarkable with benign abdomen  - Chronic history of PPI and H2 blocker use - Prior EGD 03/2015  Plan: 1. Refill Pantoprazole 40mg  daily in AM before breakfast / may continue Zantac 150mg  nightly since this current regimen controls her symptoms - Followed by [facility] in past and also Dr Allen Norris, she plans to return to them for future follow-up

## 2016-11-17 NOTE — Assessment & Plan Note (Signed)
Prior dx from [facility] in 08/2015, based on variety of cognitive / mood testing, review prior note Not diagnosed with dementia or neurodegenerative disorder Thought could be related to chronic affective disorder, age, and medication with depakote Seems improved clinically off Depakote, still has memory difficulty at times Able to perform ADLs and appropriate cognitive function, mostly limited by physical limitation with carpal tunnel neuropathy and arthritis. Also limited with social situation.

## 2016-11-17 NOTE — Assessment & Plan Note (Signed)
Chronic IBS-C, dx by GI, s/p colonoscopy, last 2015 Dr Candace Cruise Controlled on Linzess and Miralax regularly Refilled today - linzess 212mcg cap daily and Miralax 17-34g daily not PRN

## 2016-11-17 NOTE — Assessment & Plan Note (Signed)
Chronic problem, fluctuating TSH and levothyroxine dose Previously followed by [facility] End[facility] Dr Gabriel Carina, last visit 08/2016, but patient not intending to return Last TSH trend 10 > 0.8 (03/2016) > 5.295 (10/28/16) Refilled current Levothyroxine 173mcg daily Follow-up in future with TSH trend and Free T4

## 2016-11-17 NOTE — Progress Notes (Signed)
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   Relevant Orders   Vitamin B12   Vitamin D deficiency   Relevant Orders   VITAMIN D 25 Hydroxy (Vit-D Deficiency, Fractures)    Other Visit Diagnoses    Candidal intertrigo       On exam, seems improved but still residual from Diflucan oral and topical nystatin. Switch to Lotrisone cream steriod+antifungal   Relevant Medications   nystatin cream (MYCOSTATIN)   fluconazole (DIFLUCAN) 200 MG tablet   clotrimazole-betamethasone (LOTRISONE) cream      Meds ordered this encounter  Medications  . DISCONTD: Calcium Carbonate-Vitamin D 600-400 MG-UNIT tablet    Sig: Take 1 tablet by mouth 2 (two) times daily with a meal.    Refill:  0  . DISCONTD: amLODipine (NORVASC) 5 MG tablet    Sig: Take 5 mg by mouth 2 (two) times daily.  Marland Kitchen DISCONTD: levothyroxine (SYNTHROID, LEVOTHROID) 100 MCG tablet    Sig: Take 100 mcg by mouth daily before breakfast.  . Diclofenac Sodium (SOLARAZE) 3 % GEL    Sig: Place onto the skin.  Marland Kitchen nystatin cream  (MYCOSTATIN)    Sig: Apply 1 application topically 2 (two) times daily.  . fluconazole (DIFLUCAN) 200 MG tablet    Sig: Take 200 mg by mouth daily.  Marland Kitchen amLODipine (NORVASC) 5 MG tablet    Sig: Take 1 tablet (5 mg total) by mouth 2 (two) times daily.    Dispense:  180 tablet    Refill:  1  . levothyroxine (SYNTHROID, LEVOTHROID) 100 MCG tablet    Sig: Take 1 tablet (100 mcg total) by mouth daily before breakfast.    Dispense:  90 tablet    Refill:  1  . gabapentin (NEURONTIN) 100 MG capsule    Sig: Take 1-2 capsules (100-200 mg total) by mouth 3 (three) times daily.    Dispense:  540 capsule    Refill:  1  . linaclotide (LINZESS) 290 MCG CAPS capsule    Sig: Take 1 capsule (290 mcg total) by mouth daily before breakfast.    Dispense:  90 capsule    Refill:  1  . pantoprazole (PROTONIX) 40 MG tablet    Sig: Take 1 tablet (40 mg total) by mouth daily before breakfast.    Dispense:  90 tablet    Refill:  1  . tiZANidine (ZANAFLEX) 2 MG tablet    Sig: Take 0.5-1 tablets (1-2 mg total) by mouth 2 (two) times daily as needed for muscle spasms.    Dispense:  180 tablet    Refill:  1  . polyethylene glycol powder (GLYCOLAX/MIRALAX) powder    Sig: Take 17-34 g by mouth daily.    Dispense:  1700 g    Refill:  5    Multiple bottles if possible prefer 90 day supply  . cycloSPORINE (RESTASIS) 0.05 % ophthalmic emulsion    Sig: Place 1 drop into both eyes 2 (two) times daily.    Dispense:  60 each    Refill:  5    30 day supply with refills  . clotrimazole-betamethasone (LOTRISONE) cream    Sig: Apply 2 times a day for intertrigo for 2 weeks    Dispense:  30 g    Refill:  1    Follow up plan: Return in about 3 months (around 02/17/2017) for  Med refills / Carpal Tunnel / IBS / Thyroid.  Nobie Putnam, DO [facility] 11/17/2016, 5:57 PM

## 2016-11-17 NOTE — Assessment & Plan Note (Signed)
Mildly elevated initial BP - Home BP readings none available  No known complications    Plan:  1. Continue current BP regimen Refilled Amlodipine 5mg  BID (due to patient preference with elevated BP in evening, advised her usually this is 24 hr med 5-10mg  only once, but will agree to continue 5 BID for now) 2. Encourage improved lifestyle - low sodium diet, regular exercise as tolerated

## 2016-11-23 ENCOUNTER — Telehealth: Payer: Self-pay | Admitting: Obstetrics and Gynecology

## 2016-11-23 MED ORDER — ESTRADIOL 2 MG VA RING: 2.0000 mg | VAGINAL_RING | VAGINAL | 12 refills | Status: DC

## 2016-11-23 NOTE — Telephone Encounter (Signed)
Patient called and stated that she needs "The Ring" sent to her pharmacy, And she also disclosed that the medication/Ring needs to be sent [facility]. I tried to get further information from the patient but she had to finish the phone call. Please advise.

## 2016-11-23 NOTE — Telephone Encounter (Signed)
Called pt and verified which [facility]. Rx was sent. Nothing further is needed

## 2016-11-25 ENCOUNTER — Emergency Department (HOSPITAL_COMMUNITY): Payer: Medicare Other

## 2016-11-25 ENCOUNTER — Encounter (HOSPITAL_COMMUNITY): Payer: Self-pay | Admitting: Emergency Medicine

## 2016-11-25 ENCOUNTER — Emergency Department (HOSPITAL_COMMUNITY)
Admission: EM | Admit: 2016-11-25 | Discharge: 2016-11-25 | Disposition: A | Discharge status: Home / Self Care | Payer: Medicare Other | Attending: Emergency Medicine | Admitting: Emergency Medicine

## 2016-11-25 DIAGNOSIS — Z79899 Other long term (current) drug therapy: Secondary | ICD-10-CM | POA: Insufficient documentation

## 2016-11-25 DIAGNOSIS — Z5900 Homelessness unspecified: Secondary | ICD-10-CM

## 2016-11-25 DIAGNOSIS — E871 Hypo-osmolality and hyponatremia: Secondary | ICD-10-CM

## 2016-11-25 DIAGNOSIS — R079 Chest pain, unspecified: Secondary | ICD-10-CM | POA: Diagnosis present

## 2016-11-25 DIAGNOSIS — J069 Acute upper respiratory infection, unspecified: Secondary | ICD-10-CM | POA: Insufficient documentation

## 2016-11-25 DIAGNOSIS — Z87891 Personal history of nicotine dependence: Secondary | ICD-10-CM | POA: Insufficient documentation

## 2016-11-25 LAB — URINALYSIS, ROUTINE W REFLEX MICROSCOPIC
Bilirubin Urine: NEGATIVE
Glucose, UA: NEGATIVE mg/dL
Hgb urine dipstick: NEGATIVE
KETONES UR: NEGATIVE mg/dL
Leukocytes, UA: NEGATIVE
NITRITE: NEGATIVE
PH: 7 (ref 5.0–8.0)
Protein, ur: NEGATIVE mg/dL
SPECIFIC GRAVITY, URINE: 1.002 — AB (ref 1.005–1.030)

## 2016-11-25 LAB — CBC
HEMATOCRIT: 33.8 % — AB (ref 36.0–46.0)
HEMOGLOBIN: 11.7 g/dL — AB (ref 12.0–15.0)
MCH: 31.5 pg (ref 26.0–34.0)
MCHC: 34.6 g/dL (ref 30.0–36.0)
MCV: 90.9 fL (ref 78.0–100.0)
Platelets: 438 10*3/uL — ABNORMAL HIGH (ref 150–400)
RBC: 3.72 MIL/uL — ABNORMAL LOW (ref 3.87–5.11)
RDW: 14.2 % (ref 11.5–15.5)
WBC: 9.5 10*3/uL (ref 4.0–10.5)

## 2016-11-25 LAB — HEPATIC FUNCTION PANEL
ALBUMIN: 3.4 g/dL — AB (ref 3.5–5.0)
ALT: 29 U/L (ref 14–54)
AST: 27 U/L (ref 15–41)
Alkaline Phosphatase: 57 U/L (ref 38–126)
Bilirubin, Direct: <0.1 mg/dL — ABNORMAL LOW (ref 0.1–0.5)
TOTAL PROTEIN: 6.3 g/dL — AB (ref 6.5–8.1)
Total Bilirubin: 0.4 mg/dL (ref 0.3–1.2)

## 2016-11-25 LAB — BASIC METABOLIC PANEL
ANION GAP: 7 (ref 5–15)
BUN: 12 mg/dL (ref 6–20)
CO2: 23 mmol/L (ref 22–32)
Calcium: 9.1 mg/dL (ref 8.9–10.3)
Chloride: 95 mmol/L — ABNORMAL LOW (ref 101–111)
Creatinine, Ser: 0.74 mg/dL (ref 0.44–1.00)
GFR calc Af Amer: >60 mL/min (ref >60)
Glucose, Bld: 109 mg/dL — ABNORMAL HIGH (ref 65–99)
POTASSIUM: 4.9 mmol/L (ref 3.5–5.1)
SODIUM: 125 mmol/L — AB (ref 135–145)

## 2016-11-25 LAB — I-STAT TROPONIN, ED: Troponin i, poc: 0 ng/mL (ref 0.00–0.08)

## 2016-11-25 LAB — LIPASE, BLOOD: Lipase: 43 U/L (ref 11–51)

## 2016-11-25 MED ORDER — GUAIFENESIN-CODEINE 100-10 MG/5ML PO SOLN
5.0000 mL | Freq: Once | ORAL | Status: AC
  Administered 2016-11-25: 5 mL via ORAL
  Filled 2016-11-25: qty 5

## 2016-11-25 MED ORDER — IOPAMIDOL (ISOVUE-370) INJECTION 76%
INTRAVENOUS | Status: AC
  Administered 2016-11-25: 100 mL
  Filled 2016-11-25: qty 100

## 2016-11-25 MED ORDER — SODIUM CHLORIDE 0.9 % IV BOLUS (SEPSIS)
1000.0000 mL | Freq: Once | INTRAVENOUS | Status: AC
  Administered 2016-11-25: 1000 mL via INTRAVENOUS

## 2016-11-25 MED ORDER — ONDANSETRON HCL 4 MG/2ML IJ SOLN
4.0000 mg | Freq: Once | INTRAMUSCULAR | Status: AC
  Administered 2016-11-25: 4 mg via INTRAVENOUS
  Filled 2016-11-25: qty 2

## 2016-11-25 NOTE — ED Provider Notes (Signed)
TIME SEEN: 1:06 AM  CHIEF COMPLAINT: "I am dehydrated and have pneumonia"  HPI: Patient is a 66 year old female with history of SIADH, hypothyroidism, depression who presents to the emergency department with complaints of cough that is nonproductive for the past several days.  Reports low-grade temperature of 99.8.  No chills.  States that she thinks that her sodium is low because she feels weak and confused.  States that she currently lives in a homeless shelter and that people there have been bullying her and she does not feel she can go back.  She states that she went to see her pulmonologist this week for the symptoms and he gave her a breathing treatment.  She states she demanded antibiotics but he told her that she did not need them.  She states "he is a quack" and states that she never plans to see this pulmonologist again.  She also states that she was seen in urgent care tonight at 7:30 PM.  She states that they closed at 8 PM and told her that she had pneumonia, a right perforated eardrum and that she needed to come to the emergency department and she complains of chest pain from coughing for the past several days.  No vomiting or diarrhea.  No lightheadedness.  Complains of upper abdominal pain.  ROS: See HPI Constitutional: no fever  Eyes: no drainage  ENT: no runny nose   Cardiovascular:   chest pain  Resp:  SOB  GI: no vomiting GU: no dysuria Integumentary: no rash  Allergy: no hives  Musculoskeletal: no leg swelling  Neurological: no slurred speech ROS otherwise negative  PAST MEDICAL HISTORY/PAST SURGICAL HISTORY:  Past Medical History:  Diagnosis Date  . Allergic rhinitis   . Anemia   . Blurred vision   . Depression   . Diverticulosis   . Endometriosis   . Falls   . GERD (gastroesophageal reflux disease)   . Hernia, hiatal   . Hypothyroidism   . IBS (irritable bowel syndrome)   . Malignant neoplasm of skin   . Migraine   . Neuropathy   . PTSD (post-traumatic  stress disorder)   . Thyroid disease     MEDICATIONS:  Prior to Admission medications   Medication Sig Start Date End Date Taking? Authorizing Provider  amLODipine (NORVASC) 5 MG tablet Take 1 tablet (5 mg total) by mouth 2 (two) times daily. 11/17/16   Karamalegos, Devonne Doughty, DO  Ascorbic Acid (VITAMIN C) 1000 MG tablet Take 1,000 mg by mouth 2 (two) times daily.    [provider]  calcium carbonate (OS-CAL) 600 MG TABS tablet Take 1 tablet (600 mg total) by mouth 2 (two) times daily with a meal. 10/28/16   Shary Decamp, PA-C  cholecalciferol (VITAMIN D) 1000 units tablet Take 1,000 Units by mouth daily with breakfast.    [provider]  clotrimazole-betamethasone (LOTRISONE) cream Apply 2 times a day for intertrigo for 2 weeks 11/17/16   Olin Hauser, DO  conjugated estrogens (PREMARIN) vaginal cream Place 1 Applicatorful vaginally daily. Patient taking differently: Place 1 Applicatorful vaginally 2 (two) times daily.  07/20/16   Rubie Maid, MD  cycloSPORINE (RESTASIS) 0.05 % ophthalmic emulsion Place 1 drop into both eyes 2 (two) times daily. 11/17/16   Karamalegos, Devonne Doughty, DO  Diclofenac Sodium (SOLARAZE) 3 % GEL Place onto the skin.    [provider]  estradiol (ESTRING) 2 MG vaginal ring Place 2 mg every 3 (three) months vaginally. follow package directions 11/23/16  Rubie Maid, MD  ferrous sulfate (FERROUSUL) 325 (65 FE) MG tablet Take 1 tablet (325 mg total) by mouth daily with breakfast. 10/28/16   Shary Decamp, PA-C  fluconazole (DIFLUCAN) 200 MG tablet Take 200 mg by mouth daily.    [provider]  gabapentin (NEURONTIN) 100 MG capsule Take 1-2 capsules (100-200 mg total) by mouth 3 (three) times daily. 11/17/16   Karamalegos, Devonne Doughty, DO  hydrocortisone (ANUSOL-HC) 2.5 % rectal cream Place 1 application rectally 2 (two) times daily.    [provider]  lactose free nutrition (BOOST PLUS) LIQD Take 237 mLs by mouth  daily. Patient not taking: Reported on 11/17/2016 07/20/16   Rubie Maid, MD  levothyroxine (SYNTHROID, LEVOTHROID) 100 MCG tablet Take 1 tablet (100 mcg total) by mouth daily before breakfast. 11/17/16   Parks Ranger, Devonne Doughty, DO  linaclotide Rolan Lipa) 290 MCG CAPS capsule Take 1 capsule (290 mcg total) by mouth daily before breakfast. 11/17/16   Karamalegos, Devonne Doughty, DO  magnesium oxide (MAG-OX) 400 MG tablet Take 400 mg by mouth daily with breakfast.    [provider]  Multiple Vitamin (MULTIVITAMIN WITH MINERALS) TABS tablet Take 1 tablet by mouth daily with breakfast.    [provider]  NONFORMULARY OR COMPOUNDED ITEM See pharmacy note Patient not taking: Reported on 11/17/2016 11/14/16   Edrick Kins, DPM  norethindrone (AYGESTIN) 5 MG tablet Take 1 tablet (5 mg total) by mouth daily. 11/06/16   Rubie Maid, MD  nystatin cream (MYCOSTATIN) Apply 1 application topically 2 (two) times daily.    [provider]  pantoprazole (PROTONIX) 40 MG tablet Take 1 tablet (40 mg total) by mouth daily before breakfast. 11/17/16   Karamalegos, Devonne Doughty, DO  Polyethyl Glycol-Propyl Glycol (SYSTANE OP) Place 1 drop into both eyes daily as needed (dry eyes).    [provider]  polyethylene glycol powder (GLYCOLAX/MIRALAX) powder Take 17-34 g by mouth daily. 11/17/16   Karamalegos, Devonne Doughty, DO  Pyridoxine HCl (VITAMIN B-6 PO) Take 1 tablet by mouth 2 (two) times daily.    [provider]  ranitidine (ZANTAC) 150 MG tablet Take 150 mg by mouth daily before supper.    [provider]  tiZANidine (ZANAFLEX) 2 MG tablet Take 0.5-1 tablets (1-2 mg total) by mouth 2 (two) times daily as needed for muscle spasms. 11/17/16   Karamalegos, Devonne Doughty, DO  White Petrolatum-Mineral Oil (GENTEAL TEARS NIGHT-TIME) OINT Place 1 application into both eyes at bedtime.    [provider]    ALLERGIES:  Allergies  Allergen Reactions  . Bee Venom  Hives  . Darvon [Propoxyphene] Anaphylaxis and Hives    Hives/throat swelling   . Dicyclomine Itching  . Other Hives    Spider bites cause hives  . Oxycodone-Acetaminophen Nausea And Vomiting and Other (See Comments)    hallucinations   . Peanuts [Peanut Oil] Anaphylaxis and Hives  . Penicillins Hives    Has patient had a PCN reaction causing immediate rash, facial/tongue/throat swelling, SOB or lightheadedness with hypotension: Unknown Has patient had a PCN reaction causing severe rash involving mucus membranes or skin necrosis: Unknown Has patient had a PCN reaction that required hospitalization: Unknown Has patient had a PCN reaction occurring within the last 10 years: Unknown If all of the above answers are "NO", then may proceed with Cephalosporin use.  . Sulfa Antibiotics Anaphylaxis, Hives and Itching  . Sulfacetamide Sodium Anaphylaxis, Hives and Itching  . Ultram [Tramadol] Hives  . Amikacin Nausea And  Vomiting  . Aspirin Hives  . Doxycycline Photosensitivity  . Haloperidol Nausea And Vomiting  . Hymenoptera Venom Preparations Hives    Reaction to spider bites  . Imipramine Hives    Reaction to Tofranil  . Keflex [Cephalexin] Hives  . Sulfur Hives  . Adhesive [Tape] Hives and Rash  . Hydroxyzine Anxiety and Itching  . Prednisone Nausea And Vomiting, Anxiety and Other (See Comments)    Crazy mood swings, strange thoughts (pt does tolerate cortisone injections)    SOCIAL HISTORY:  Social History   Tobacco Use  . Smoking status: Former Smoker    Last attempt to quit: 11/17/1976    Years since quitting: 40.0  . Smokeless tobacco: Never Used  Substance Use Topics  . Alcohol use: No    FAMILY HISTORY: Family History  Problem Relation Age of Onset  . Heart disease Father   . Cancer Mother        lung  . Urolithiasis Neg Hx   . Kidney disease Neg Hx   . Kidney cancer Neg Hx   . Prostate cancer Neg Hx     EXAM: BP 134/82   Pulse 79   Temp 99.2 F (37.3  C) (Oral)   Resp 17   Ht 5\' 3"  (1.6 m)   Wt 59.9 kg (132 lb)   SpO2 92%   BMI 23.38 kg/m  CONSTITUTIONAL: Alert and oriented x3 and responds appropriately to questions.  Chronically ill-appearing but in no distress HEAD: Normocephalic EYES: Conjunctivae clear, pupils appear equal, EOMI ENT: normal nose; moist mucous membranes; No pharyngeal erythema or petechiae, no tonsillar hypertrophy or exudate, no uvular deviation, no unilateral swelling, no trismus or drooling, no muffled voice, normal phonation, no stridor, no dental caries present, no drainable dental abscess noted, no Ludwig's angina, tongue sits flat in the bottom of the mouth, no angioedema, no facial erythema or warmth, no facial swelling; no pain with movement of the neck.  TMs are clear bilaterally without erythema, purulence, bulging, perforation, effusion.  No cerumen impaction or sign of foreign body in the external auditory canal. No inflammation, erythema or drainage from the external auditory canal. No signs of mastoiditis. No pain with manipulation of the pinna bilaterally. NECK: Supple, no meningismus, no nuchal rigidity, no LAD  CARD: RRR; S1 and S2 appreciated; no murmurs, no clicks, no rubs, no gallops RESP: Normal chest excursion without splinting or tachypnea; breath sounds clear and equal bilaterally; no wheezes, no rhonchi, no rales, no hypoxia or respiratory distress, speaking full sentences ABD/GI: Normal bowel sounds; non-distended; soft, non-tender, no rebound, no guarding, no peritoneal signs, no hepatosplenomegaly BACK:  The back appears normal and is non-tender to palpation, there is no CVA tenderness EXT: Normal ROM in all joints; non-tender to palpation; no edema; normal capillary refill; no cyanosis, no calf tenderness or swelling    SKIN: Normal color for age and race; warm; no rash NEURO: Moves all extremities equally PSYCH: The patient's mood and manner are appropriate. Grooming and personal hygiene are  appropriate.  MEDICAL DECISION MAKING: Patient here stating that she feels dehydrated and feels that she has pneumonia.  Her lungs are currently clear to auscultation with good aeration and she is not hypoxic.  She states that she was told she had a perforated eardrum at the urgent care but I do not see this on my examination.  She has no sign of otitis media but she has Artie been given drops.  Patient is requesting multiple different labs and  imaging.  We have talked about the plan of care in the emergency department to evaluate her for hyponatremia, dehydration and evaluate for possible ACS, PE, pneumonia, CHF today.  She is requesting IV fluids which I feel is appropriate.  Patient is demanding admission to the hospital.  We have had a lengthy discussion about admission criteria and if she meets any of these criteria and truly needs admission we will admit her to the hospital.  She is requesting that I put her diagnosis as pneumonia so that we.  I have explained to her that if she does not have pneumonia I cannot falsify her chart as this is fraudulent.  ED PROGRESS: Patient's labs are unremarkable.  Her sodium is slightly low at 125 but this does not need medical admission.  She has received 2 L of IV fluids.  She is concerned about her protein being low.  Her protein and albumin are slightly low but not significantly and she does not look malnourished.  Her urine shows no sign of infection or ketones to suggest dehydration.  Her troponin is negative and I do not think this is ACS.  Chest x-ray is clear and I have even performed a CTA of her chest to rule out PE and to further evaluate her lungs and this also looks normal.  No pneumonia, CHF or PE present.  She again has not been hypoxic at all here in the emergency department.  She has no leukocytosis.  Her hemoglobin is stable.  She is asking for a copy of her medical records and I have discussed with her that she can have her primary care physician sent  a release form to our medical record department to obtain these records so that he can review them.  I honestly do not feel comfortable giving patient a copy of these records as I feel she will scrutinize every small detail as she has been very argumentative here in the emergency department.  She states that she is upset because I have accused her of fraud.  We have discussed this further that I was only trying to explain to her why we cannot falsify her chart and try to bill Medicare for something that she does not have or admit her to the hospital when she does not need it.  Her friend at bedside agrees and is trying to calm her but patient repeatedly interrupting both of [place] and yelling over top of [place].  I have explained to her at length or attempted to do that I feel she can be discharged and that she does not need antibiotics and she does not need admission and she can follow-up with her outpatient primary care doctor.  I suspect there may be some component of malingering because she is homeless and does not want to go back to the homeless shelter.  I have attempted to help her with placement and possibly going with this friend but patient continues to yell at me and asked me to leave the room.  At this point I do not feel there is anything else I can do for this patient from the emergency department and I feel she is safe to be discharged.    At this time, I do not feel there is any life-threatening condition present. I have reviewed and discussed all results (EKG, imaging, lab, urine as appropriate) and exam findings with patient/family. I have reviewed nursing notes and appropriate previous records.  I feel the patient is safe to be discharged  home without further emergent workup and can continue workup as an outpatient as needed. Discussed usual and customary return precautions. Patient/family verbalize understanding and are comfortable with this plan.  Outpatient follow-up has been provided if needed.  All questions have been answered.     EKG Interpretation  Date/Time:  Saturday November 25 2016 00:54:27 EST Ventricular Rate:  58 PR Interval:    QRS Duration: 93 QT Interval:  414 QTC Calculation: 407 R Axis:   49 Text Interpretation:  Sinus rhythm Probable left atrial enlargement No significant change since last tracing Confirmed by Ostin Mathey, Cyril Mourning [phone]) on 11/25/2016 1:04:53 AM         Jarry Manon, Delice Bison, DO 11/25/16 0424

## 2016-11-25 NOTE — Discharge Instructions (Signed)
You were seen in the emergency department for cough.  You have had a chest x-ray and a CT scan of your chest which showed no pneumonia, no fluid on your lungs, no blood clot.  Blood tests showed no sign of heart attack.  Urine shows no sign of infection or dehydration.  Your sodium was slightly low at 125.  We have given you 2 L of normal saline here in the emergency department and you can have this rechecked by your primary care physician as an outpatient.  You do not need to be on oral antibiotics for your cough as this is likely caused by a viral illness.  You do not meet any criteria for medical admission to the hospital.  If you would like a copy of your medical records you can contact the medical record department for information on how to do this or you can go to your primary care doctor and sign a release form and medical records can fax the information directly to your primary care physician.

## 2016-11-25 NOTE — ED Notes (Signed)
Patient transported to X-ray 

## 2016-11-25 NOTE — ED Notes (Signed)
Dr. Leonides Schanz explained tests results and discharge plan to pt.

## 2016-11-25 NOTE — ED Triage Notes (Signed)
Per EMS, pt from home. Pt reports productive cough for the past  4 days. Pt reports going to [facility] Before calling EMS and was dx with pneumonia. Pt c/o intercostal generalized cp without radiation that started today, worse with inspiration/coughing. Pt reports chills with a low grade fever yesterday.   EMS VS BP 140/75, P 60, SpO2 97% RA, R 16. CBG 151.

## 2016-11-26 ENCOUNTER — Other Ambulatory Visit: Payer: Self-pay

## 2016-11-26 ENCOUNTER — Encounter: Payer: Self-pay | Admitting: Emergency Medicine

## 2016-11-26 ENCOUNTER — Emergency Department
Admission: EM | Admit: 2016-11-26 | Discharge: 2016-11-26 | Disposition: A | Discharge status: Home / Self Care | Payer: Medicare Other | Attending: Emergency Medicine | Admitting: Emergency Medicine

## 2016-11-26 ENCOUNTER — Emergency Department: Payer: Medicare Other

## 2016-11-26 DIAGNOSIS — R05 Cough: Secondary | ICD-10-CM

## 2016-11-26 DIAGNOSIS — R059 Cough, unspecified: Secondary | ICD-10-CM

## 2016-11-26 DIAGNOSIS — E039 Hypothyroidism, unspecified: Secondary | ICD-10-CM | POA: Insufficient documentation

## 2016-11-26 DIAGNOSIS — Z8673 Personal history of transient ischemic attack (TIA), and cerebral infarction without residual deficits: Secondary | ICD-10-CM | POA: Insufficient documentation

## 2016-11-26 DIAGNOSIS — I10 Essential (primary) hypertension: Secondary | ICD-10-CM | POA: Diagnosis not present

## 2016-11-26 DIAGNOSIS — Z87891 Personal history of nicotine dependence: Secondary | ICD-10-CM | POA: Diagnosis not present

## 2016-11-26 DIAGNOSIS — Z79899 Other long term (current) drug therapy: Secondary | ICD-10-CM | POA: Insufficient documentation

## 2016-11-26 DIAGNOSIS — J189 Pneumonia, unspecified organism: Secondary | ICD-10-CM | POA: Diagnosis not present

## 2016-11-26 LAB — CBC WITH DIFFERENTIAL/PLATELET
Basophils Absolute: 0.1 10*3/uL (ref 0–0.1)
Basophils Relative: 1 %
EOS ABS: 0.3 10*3/uL (ref 0–0.7)
EOS PCT: 2 %
HCT: 36.8 % (ref 35.0–47.0)
Hemoglobin: 12.5 g/dL (ref 12.0–16.0)
LYMPHS ABS: 1.4 10*3/uL (ref 1.0–3.6)
LYMPHS PCT: 12 %
MCH: 31.5 pg (ref 26.0–34.0)
MCHC: 34 g/dL (ref 32.0–36.0)
MCV: 92.8 fL (ref 80.0–100.0)
MONO ABS: 0.6 10*3/uL (ref 0.2–0.9)
MONOS PCT: 5 %
Neutro Abs: 9.8 10*3/uL — ABNORMAL HIGH (ref 1.4–6.5)
Neutrophils Relative %: 80 %
PLATELETS: 520 10*3/uL — AB (ref 150–440)
RBC: 3.97 MIL/uL (ref 3.80–5.20)
RDW: 14.1 % (ref 11.5–14.5)
WBC: 12.2 10*3/uL — AB (ref 3.6–11.0)

## 2016-11-26 LAB — COMPREHENSIVE METABOLIC PANEL
ALK PHOS: 66 U/L (ref 38–126)
ALT: 25 U/L (ref 14–54)
AST: 24 U/L (ref 15–41)
Albumin: 3.8 g/dL (ref 3.5–5.0)
Anion gap: 11 (ref 5–15)
BILIRUBIN TOTAL: 0.1 mg/dL — AB (ref 0.3–1.2)
BUN: 9 mg/dL (ref 6–20)
CALCIUM: 8.5 mg/dL — AB (ref 8.9–10.3)
CHLORIDE: 98 mmol/L — AB (ref 101–111)
CO2: 22 mmol/L (ref 22–32)
CREATININE: 0.65 mg/dL (ref 0.44–1.00)
Glucose, Bld: 161 mg/dL — ABNORMAL HIGH (ref 65–99)
Potassium: 3.7 mmol/L (ref 3.5–5.1)
Sodium: 131 mmol/L — ABNORMAL LOW (ref 135–145)
Total Protein: 7.7 g/dL (ref 6.5–8.1)

## 2016-11-26 MED ORDER — AZITHROMYCIN 250 MG PO TABS: ORAL_TABLET | ORAL | 0 refills | Status: AC

## 2016-11-26 NOTE — ED Provider Notes (Signed)
[facility] Emergency Department Provider Note   ____________________________________________   First MD Initiated Contact with Patient 11/26/16 1411     (approximate)  I have reviewed the triage vital signs and the nursing notes.   HISTORY  Chief Complaint Pneumonia (Pt dx with pneumonia friday at [facility]) and Cough   HPI Angela [name] is a 66 y.o. female Who comes in saying she has a pneumonia and was given antibiotics by the previous doctor but when she went to the pharmacy she was allergic to those antibiotics. Review of the records indicates the patient had a chest x-ray and CT that showed no pneumonia or blood clot or anything else.patient did have a sodium of 125. Patient also complains of a rash and itching under her breasts. She's been seeing her dermatologist for dermatologist is tried multiple treatments. I advised the patient to follow up with the dermatologist for the rash.   Past Medical History:  Diagnosis Date  . Allergic rhinitis   . Anemia   . Blurred vision   . Depression   . Diverticulosis   . Endometriosis   . Falls   . GERD (gastroesophageal reflux disease)   . Hernia, hiatal   . Hypothyroidism   . IBS (irritable bowel syndrome)   . Malignant neoplasm of skin   . Migraine   . Neuropathy   . PTSD (post-traumatic stress disorder)   . Thyroid disease     Patient Active Problem List   Diagnosis Date Noted  . IBS (irritable bowel syndrome) 11/17/2016  . Bilateral carpal tunnel syndrome 11/17/2016  . Plantar fasciitis, bilateral 11/17/2016  . Chronic pain syndrome 11/17/2016  . Chronic hyponatremia 11/17/2016  . PTSD (post-traumatic stress disorder) 11/17/2016  . Hernia, hiatal 11/17/2016  . Vitamin D deficiency 11/17/2016  . Vitamin B12 deficiency 11/17/2016  . SIADH (syndrome of inappropriate ADH production) ([place]) 09/08/2016  . Cervical spondylosis with myelopathy 05/24/2016  . Post-concussion headache 05/24/2016  .  MCI (mild cognitive impairment) with memory loss 03/30/2016  . Gait abnormality 03/30/2016  . Chronic bipolar affective disorder ([place]) 01/24/2016  . Anemia, iron deficiency 06/24/2015  . Endometriosis 06/24/2015  . Essential hypertension 06/24/2015  . History of postmenopausal bleeding 05/22/2015  . History of TIA (transient ischemic attack) 04/20/2015  . Hypothyroidism 04/20/2015  . GERD (gastroesophageal reflux disease) 04/20/2015  . Cervical disc disorder with radiculopathy 03/25/2015  . Chronic neck pain 03/25/2015  . Pelvic pain in female 10/30/2014  . History of skin cancer 06/30/2014  . Chronic headaches 12/16/2013  . Neuropathy 12/16/2013    Past Surgical History:  Procedure Laterality Date  . APPENDECTOMY    . CHOLECYSTECTOMY    . CHOLECYSTECTOMY, LAPAROSCOPIC    . HERNIA REPAIR    . laproscopy    . TONSILLECTOMY    . uteral suspension      Prior to Admission medications   Medication Sig Start Date End Date Taking? Authorizing Provider  amLODipine (NORVASC) 5 MG tablet Take 1 tablet (5 mg total) by mouth 2 (two) times daily. 11/17/16  Yes Karamalegos, Devonne Doughty, DO  calcium carbonate (OS-CAL) 600 MG TABS tablet Take 1 tablet (600 mg total) by mouth 2 (two) times daily with a meal. Patient taking differently: Take 600 mg 3 (three) times daily by mouth.  10/28/16  Yes Shary Decamp, PA-C  conjugated estrogens (PREMARIN) vaginal cream Place 1 Applicatorful vaginally daily. Patient taking differently: Place 1 Applicatorful vaginally 2 (two) times daily.  07/20/16  Yes Rubie Maid, MD  cycloSPORINE (RESTASIS) 0.05 % ophthalmic emulsion Place 1 drop into both eyes 2 (two) times daily. 11/17/16  Yes Karamalegos, Devonne Doughty, DO  estradiol (ESTRING) 2 MG vaginal ring Place 2 mg every 3 (three) months vaginally. follow package directions 11/23/16  Yes Rubie Maid, MD  ferrous sulfate (FERROUSUL) 325 (65 FE) MG tablet Take 1 tablet (325 mg total) by mouth daily with breakfast.  10/28/16  Yes Shary Decamp, PA-C  gabapentin (NEURONTIN) 100 MG capsule Take 1-2 capsules (100-200 mg total) by mouth 3 (three) times daily. 11/17/16  Yes Karamalegos, Devonne Doughty, DO  levothyroxine (SYNTHROID, LEVOTHROID) 100 MCG tablet Take 1 tablet (100 mcg total) by mouth daily before breakfast. 11/17/16  Yes Karamalegos, Devonne Doughty, DO  linaclotide (LINZESS) 290 MCG CAPS capsule Take 1 capsule (290 mcg total) by mouth daily before breakfast. Patient taking differently: Take 290 mcg every other day by mouth.  11/17/16  Yes Karamalegos, Devonne Doughty, DO  magnesium oxide (MAG-OX) 400 MG tablet Take 400 mg by mouth daily with breakfast.   Yes [provider]  pantoprazole (PROTONIX) 40 MG tablet Take 1 tablet (40 mg total) by mouth daily before breakfast. 11/17/16  Yes Karamalegos, Devonne Doughty, DO  Pyridoxine HCl (VITAMIN B-6 PO) Take 1 tablet by mouth 2 (two) times daily.   Yes [provider]  ranitidine (ZANTAC) 150 MG tablet Take 150 mg by mouth daily before supper.   Yes [provider]  White Petrolatum-Mineral Oil (GENTEAL TEARS NIGHT-TIME) OINT Place 1 application into both eyes at bedtime.   Yes [provider]  azithromycin (ZITHROMAX Z-PAK) 250 MG tablet Take 2 tablets (500 mg) on  Day 1,  followed by 1 tablet (250 mg) once daily on Days 2 through 5. 11/26/16 12/01/16  Nena Polio, MD  Diclofenac Sodium (SOLARAZE) 3 % GEL Place 1 application 3 (three) times daily onto the skin.     [provider]  nystatin cream (MYCOSTATIN) Apply 1 application 2 (two) times daily as needed topically for dry skin.     [provider]  Polyethyl Glycol-Propyl Glycol (SYSTANE OP) Place 1 drop into both eyes daily as needed (dry eyes).    [provider]  polyethylene glycol powder (GLYCOLAX/MIRALAX) powder Take 17-34 g by mouth daily. 11/17/16   Karamalegos, Devonne Doughty, DO  tiZANidine (ZANAFLEX) 2 MG tablet Take 0.5-1 tablets (1-2 mg total) by  mouth 2 (two) times daily as needed for muscle spasms. 11/17/16   Olin Hauser, DO    Allergies Bee venom; Darvon [propoxyphene]; Dicyclomine; Other; Oxycodone-acetaminophen; Peanuts [peanut oil]; Penicillins; Sulfa antibiotics; Sulfacetamide sodium; Ultram [tramadol]; Amikacin; Aspirin; Doxycycline; Haloperidol; Hymenoptera venom preparations; Imipramine; Keflex [cephalexin]; Sulfur; Adhesive [tape]; Hydroxyzine; and Prednisone  Family History  Problem Relation Age of Onset  . Heart disease Father   . Cancer Mother        lung  . Urolithiasis Neg Hx   . Kidney disease Neg Hx   . Kidney cancer Neg Hx   . Prostate cancer Neg Hx     Social History Social History   Tobacco Use  . Smoking status: Former Smoker    Last attempt to quit: 11/17/1976    Years since quitting: 40.0  . Smokeless tobacco: Never Used  Substance Use Topics  . Alcohol use: No  . Drug use: No    Review of Systems  Constitutional: No fever/chills Eyes: No visual changes. ENT: No sore throat. Cardiovascular: Denies chest pain. Respiratory: Denies shortness of breath. Gastrointestinal: No abdominal  pain.  No nausea, no vomiting.  No diarrhea.  No constipation. Genitourinary: Negative for dysuria. Musculoskeletal: Negative for back pain. Skin: Negative for rash. Neurological: Negative for headaches, focal weakness   ____________________________________________   PHYSICAL EXAM:  VITAL SIGNS: ED Triage Vitals  Enc Vitals Group     BP 11/26/16 1204 140/76     Pulse Rate 11/26/16 1204 74     Resp 11/26/16 1204 17     Temp 11/26/16 1204 99.3 F (37.4 C)     Temp Source 11/26/16 1204 Oral     SpO2 11/26/16 1204 97 %     Weight 11/26/16 1205 132 lb (59.9 kg)     Height 11/26/16 1205 5\' 3"  (1.6 m)     Head Circumference --      Peak Flow --      Pain Score 11/26/16 1201 0     Pain Loc --      Pain Edu? --      Excl. in Advance? --    Constitutional: Alert and oriented. Well appearing and in  no acute distress. Eyes: Conjunctivae are normal. Head: Atraumatic. Nose: No congestion/rhinnorhea. Mouth/Throat: Mucous membranes are moist.  Oropharynx non-erythematous. Neck: No stridor.  Cardiovascular: Normal rate, regular rhythm. Grossly normal heart sounds.  Good peripheral circulation. Respiratory: Normal respiratory effort.  No retractions. Lungs CTAB. Gastrointestinal: Soft and nontender. No distention. No abdominal bruits. No CVA tenderness. Musculoskeletal: No lower extremity tenderness nor edema.  No joint effusions. Neurologic:  Normal speech and language. No gross focal neurologic deficits are appreciated. No gait instability.patient walking around in the room Skin:  Skin is warm, dry and intact. maculopapular rash under the breasts. Looks more like acandidal rash but patient has not had any luck with antifungal creams Psychiatric: Mood and affect are normal. Speech and behavior are normal.  ____________________________________________   LABS (all labs ordered are listed, but only abnormal results are displayed)  Labs Reviewed  CBC WITH DIFFERENTIAL/PLATELET - Abnormal; Notable for the following components:      Result Value   WBC 12.2 (*)    Platelets 520 (*)    Neutro Abs 9.8 (*)    All other components within normal limits  COMPREHENSIVE METABOLIC PANEL - Abnormal; Notable for the following components:   Sodium 131 (*)    Chloride 98 (*)    Glucose, Bld 161 (*)    Calcium 8.5 (*)    Total Bilirubin 0.1 (*)    All other components within normal limits   ____________________________________________  EKG   ____________________________________________  RADIOLOGY  Dg Chest 2 View  Result Date: 11/26/2016 CLINICAL DATA:  Shortness of breath and cough EXAM: CHEST  2 VIEW COMPARISON:  Chest CT and chest radiograph November 25, 2016 FINDINGS: There is no appreciable edema or consolidation. Heart is upper normal in size with pulmonary vascularity within normal  limits. No adenopathy. There is a moderate hiatal hernia. There is stable anterior wedging of a midthoracic vertebral body. IMPRESSION: No edema or consolidation.  Moderate hiatal hernia. Electronically Signed   By: Lowella Grip III M.D.   On: 11/26/2016 13:42   and agree with the reading above  ____________________________________________   PROCEDURES  Procedure(s) performed: Procedures  Critical Care performed:   ____________________________________________   INITIAL IMPRESSION / ASSESSMENT AND PLAN / ED COURSE  As part of my medical decision making, I reviewed the following data within the [facility]     records from last several days were examined along  with the records on care everywhere. See history of present illness patient is no longer hyponatremic she looks well she is not running a fever the rash that she has is chronic she is not in any respiratory distress she appears to be doing well I will let her go.   ____________________________________________   FINAL CLINICAL IMPRESSION(S) / ED DIAGNOSES  Final diagnoses:  Cough     ED Discharge Orders        Ordered    azithromycin (ZITHROMAX Z-PAK) 250 MG tablet     11/26/16 1636       Note:  This document was prepared using Dragon voice recognition software and may include unintentional dictation errors.    Nena Polio, MD 11/26/16 2033

## 2016-11-26 NOTE — Discharge Instructions (Signed)
there is not a pneumonia on the chest x-ray but just in case and she coughing a white blood count elevated little bit I will give you some Zithromax 2 pills today and one pill every day after that until gone. Please return if you're worse feeling sicker short of breath or have any other complaints.
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
## 2016-11-26 NOTE — ED Notes (Signed)
Pt discharged. Signature pad not working. Charge nurse aware.

## 2016-11-26 NOTE — ED Notes (Signed)
PT REFUSED IV STICK BY THIS NURSE. PT STATES "IF I DONT GET THE "[place]" [place], THERE IS NO WAY TO GET BLOOD"

## 2016-11-26 NOTE — ED Notes (Signed)
Pt now wanting a bottle of childrens pedialyte and a diflucan rx and a sandwich tray.
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
## 2016-11-26 NOTE — ED Triage Notes (Signed)
In triage: Pt states she was dx with Pneumonia Friday at [facility] . Pt states she was prescribed antibiotic but had an allergy to it when she went to pick up the prescription from pharmacy.

## 2016-11-26 NOTE — ED Notes (Signed)
Pt refusing to leave until she speaks with dr

## 2016-11-26 NOTE — ED Notes (Signed)
Pt unhooked herself from the monitor again - wandering around the room

## 2016-11-27 ENCOUNTER — Emergency Department
Admission: EM | Admit: 2016-11-27 | Discharge: 2016-11-27 | Disposition: A | Discharge status: Home / Self Care | Payer: Medicare Other | Attending: Emergency Medicine | Admitting: Emergency Medicine

## 2016-11-27 ENCOUNTER — Telehealth: Payer: Self-pay | Admitting: Obstetrics and Gynecology

## 2016-11-27 ENCOUNTER — Encounter: Payer: Self-pay | Admitting: Emergency Medicine

## 2016-11-27 DIAGNOSIS — N939 Abnormal uterine and vaginal bleeding, unspecified: Secondary | ICD-10-CM | POA: Diagnosis not present

## 2016-11-27 DIAGNOSIS — Z5321 Procedure and treatment not carried out due to patient leaving prior to being seen by health care provider: Secondary | ICD-10-CM | POA: Insufficient documentation

## 2016-11-27 LAB — COMPREHENSIVE METABOLIC PANEL
ALK PHOS: 73 U/L (ref 38–126)
ALT: 29 U/L (ref 14–54)
ANION GAP: 13 (ref 5–15)
AST: 36 U/L (ref 15–41)
Albumin: 4.2 g/dL (ref 3.5–5.0)
BILIRUBIN TOTAL: 0.3 mg/dL (ref 0.3–1.2)
BUN: 11 mg/dL (ref 6–20)
CALCIUM: 9.1 mg/dL (ref 8.9–10.3)
CO2: 21 mmol/L — ABNORMAL LOW (ref 22–32)
Chloride: 98 mmol/L — ABNORMAL LOW (ref 101–111)
Creatinine, Ser: 0.74 mg/dL (ref 0.44–1.00)
GFR calc non Af Amer: >60 mL/min (ref >60)
Glucose, Bld: 97 mg/dL (ref 65–99)
POTASSIUM: 4.2 mmol/L (ref 3.5–5.1)
SODIUM: 132 mmol/L — AB (ref 135–145)
TOTAL PROTEIN: 7.7 g/dL (ref 6.5–8.1)

## 2016-11-27 LAB — CBC
HEMATOCRIT: 38.3 % (ref 35.0–47.0)
HEMOGLOBIN: 13.4 g/dL (ref 12.0–16.0)
MCH: 32.4 pg (ref 26.0–34.0)
MCHC: 35 g/dL (ref 32.0–36.0)
MCV: 92.6 fL (ref 80.0–100.0)
Platelets: 562 10*3/uL — ABNORMAL HIGH (ref 150–440)
RBC: 4.13 MIL/uL (ref 3.80–5.20)
RDW: 13.9 % (ref 11.5–14.5)
WBC: 8.8 10*3/uL (ref 3.6–11.0)
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
## 2016-11-27 NOTE — ED Notes (Addendum)
Pt called to exam room; pt ambulatory to STAT desk without difficulty or distress noted; pt st that she has someone coming to take her to [facility]; informed pt that she is going back to be examined by ED provider but refuses, stating "I have been here 3 hours hemorrhaging, and I was here last night and they didn't do anything for me, so I guess not"

## 2016-11-27 NOTE — Telephone Encounter (Signed)
I did speak with Angela [name] about the call - Dr Marcelline Mates is not in the office at this time - message sent to [facility].

## 2016-11-27 NOTE — Telephone Encounter (Signed)
Called pt to gain further information, pt informed me sx started 2 days ago but did not want to disclose no other information to me wanted to speak with physician. When offered if bleeding is heavy and she has passed out to call EMS but pt states she wanted to talk to provider
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
## 2016-11-27 NOTE — ED Notes (Signed)
FIRST NURSE NOTE: Pt arrives to ER via [facility] complaining of vaginal bleeding. Pt alert and oriented X4, active, cooperative, pt in NAD. RR even and unlabored, color WNL.
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
## 2016-12-01 ENCOUNTER — Ambulatory Visit (INDEPENDENT_AMBULATORY_CARE_PROVIDER_SITE_OTHER): Payer: Medicare Other | Admitting: Obstetrics and Gynecology

## 2016-12-01 ENCOUNTER — Encounter: Payer: Self-pay | Admitting: Obstetrics and Gynecology

## 2016-12-01 VITALS — BP 118/78 | HR 70 | Ht 63.0 in | Wt 134.7 lb

## 2016-12-01 DIAGNOSIS — N950 Postmenopausal bleeding: Secondary | ICD-10-CM | POA: Diagnosis not present

## 2016-12-01 DIAGNOSIS — N3941 Urge incontinence: Secondary | ICD-10-CM | POA: Diagnosis not present

## 2016-12-01 DIAGNOSIS — R21 Rash and other nonspecific skin eruption: Secondary | ICD-10-CM

## 2016-12-01 DIAGNOSIS — N83201 Unspecified ovarian cyst, right side: Secondary | ICD-10-CM | POA: Diagnosis not present

## 2016-12-01 DIAGNOSIS — N7011 Chronic salpingitis: Secondary | ICD-10-CM | POA: Diagnosis not present

## 2016-12-01 MED ORDER — TOLTERODINE TARTRATE ER 2 MG PO CP24: 2.0000 mg | ORAL_CAPSULE | Freq: Every day | ORAL | 11 refills | Status: DC

## 2016-12-01 MED ORDER — PRASTERONE 6.5 MG VA INST: 1.0000 | VAGINAL_INSERT | Freq: Every day | VAGINAL | 11 refills | Status: DC

## 2016-12-01 NOTE — Progress Notes (Signed)
GYNECOLOGY PROGRESS NOTE  Subjective:    Patient ID: Angela [name], female    DOB: Nov 26, 1950, 66 y.o.   MRN: [id number]  HPI  Patient is a 66 y.o. [id number] female with h/o bipolar disorder who presents for insertion of Estring for management of her postmenopausal atrophy symptoms. Patient notes that she has read over some of the side effects, and is not sure that she would like to initiate the medication at this time.  Desires to know her other optoins.   She also complaints of vaginal bleeding over the past week.  Patient states that she has been "hemorrhaging".  Went to the ER 2 days ago, however did not stay as she stated the wait was too long.  Reports that she then left and went to [facility].  States that she was told that her bleeding was due to withdrawal from the recent progesterone that had been prescribed due her episode of light vaginal spotting/bleeding 2-3 weeks ago.  Also states she was diagnosed with a right ovarian cyst. She had an ultrasound which revealed a thin endometrial stripe, so bleeding attributed to endometrial atrophy.   Patient also reports that she feels as though she feels as though  is "dying".  States that she is now living in a shelter as there was an issue with some "stolen money" from the hospital, and so she was no longer able to afford her nursing facility.  She also states that the last facility was "trying to starve her" as she was only getting 1 meal per day (states that several of the staff were showing malice towards her and told her she was racist, and so when she injured her foot and could not make it to the dining area, they would not bring food to her room but once per day).  Patient notes she thought she had pneumonia and went to the ER 2 additional times, however states that they did not diagnose her with anything because they did not know what they were doing at [facility] ER.  Also reports a visit to [facility].  States they told her there  that her sodium was "severely low", but did not admit her even though she requested it.    Patient also requests a breast and pelvic exam, just to check.    The following portions of the patient's history were reviewed and updated as appropriate: allergies, current medications, past family history, past medical history, past social history, past surgical history and problem list.  Review of Systems A comprehensive review of systems was negative except for: Genitourinary: positive for urinary incontinence that is progressively worsening.  Patient states that she has had a history of mild urge  Incontinence, which was initially treated with medication but notes that she stopped the medicine due to side effects (dry mouth).  States that she has just been dealing with it over the past several years, but now it has worsened to wear she is leaking large amounts of urine of the past few weeks.   Objective:   Blood pressure 118/78, pulse 70, height 5\' 3"  (1.6 m), weight 134 lb 11.2 oz (61.1 kg). General appearance: alert and no distress  Breasts: normal appearance, no masses or tenderness Skin: rash, papular - linear across chest just underneath breasts. Normal skin turgor and mobility Abdomen: soft, non-tender; bowel sounds normal; no masses,  no organomegaly.  Bloating present Pelvic: external genitalia normal, rectovaginal septum normal.  Vagina without discharge. No blood in  vaginal vault. Cervix normal appearing, no lesions and no motion tenderness. Uterus mobile, nontender, normal shape and size.  Adnexae non-palpable, tender on right side.  Extremities: extremities normal, atraumatic, no cyanosis or edema Neurologic: Grossly normal     Labs:  Lab Results  Component Value Date   WBC 8.8 11/27/2016   HGB 13.4 11/27/2016   HCT 38.3 11/27/2016   MCV 92.6 11/27/2016   PLT 562 (H) 11/27/2016   Lab Results  Component Value Date   CREATININE 0.74 11/27/2016   BUN 11 11/27/2016   NA 132 (L)  11/27/2016   K 4.2 11/27/2016   CL 98 (L) 11/27/2016   CO2 21 (L) 11/27/2016   Lab Results  Component Value Date   ALT 29 11/27/2016   AST 36 11/27/2016   ALKPHOS 73 11/27/2016   BILITOT 0.3 11/27/2016      Imaging:  CLINICAL DATA:  Postmenopausal bleeding  EXAM: TRANSABDOMINAL AND TRANSVAGINAL ULTRASOUND OF PELVIS (11/02/2017)  TECHNIQUE: Both transabdominal and transvaginal ultrasound examinations of the pelvis were performed. Transabdominal technique was performed for global imaging of the pelvis including uterus, ovaries, adnexal regions, and pelvic cul-de-sac. It was necessary to proceed with endovaginal exam following the transabdominal exam to visualize the uterus, endometrium and LEFT ovary.  COMPARISON:  CT abdomen and pelvis 10/15/2015  FINDINGS: Uterus  Measurements: 4.5 x 2.7 x 3.7 cm. Retroverted. Otherwise normal morphology without mass.  Endometrium  Thickness: 3 mm thick, normal. No endometrial fluid or focal abnormality  Right ovary  Measurements: 3.3 x 2.1 x 3.4 cm. Probable RIGHT ovarian cyst 3.1 x 2.0 x 3.0 cm, simple features.  Left ovary  Measurements: 3.0 x 1.7 x 1.8 cm. Normal morphology without mass  Other findings  Slight tubular appearing hypoechoic collection in the RIGHT adnexa, somewhat curvilinear, 19 x 21 x 11 mm, question short segment of hydrosalpinx. No free fluid.  IMPRESSION: Simple appearing RIGHT ovarian cyst 3.1 cm greatest diameter.  Question short segment of hydrosalpinx and RIGHT adnexa.  Unremarkable uterus and endometrial complex.   Electronically Signed   By: Lavonia Dana M.D.   On: 11/03/2016 15:07   Assessment:   PMB Papular skin rash Right ovarian simple cyst with small hydrosalpinx Urge urinary incontinence  Plan:   - PMB.  Discussion has been had in the past regarding management of her PMB.  Has been suggested several times on Hysteroscopy D&C (when endometrial lining was  a bit thicker), however patient would continue to put this off.  Have also suggested that patient consider hysterectomy as she has bouts every 4-6 months with PMB due to atrophy.  She has been using Premarin cream. Was also treated several weeks ago with short trial of Provera.   Was to have Estring placed today, but is now leary of the side effects of the estrogen and declines. Discussed other options for management.  Now desires to try Intrarosa.  Given samples. To f/u in 4 weeks to assess how treatment is working.  - Papular skin rash on chest.  Advised on hydrocortisone cream to the area 1-2 times daily.  - Right ovarian cyst (simple cyst) with small hydrosalpinx. Patient noting tenderness to palpation on pelvic exam. Discussed that cysts usually resolve over time without intervention. Can take OTC pain meds as needed. To f/u if symptoms worsen.  - Urge incontinence, worsening over the past several weeks. Likely secondary to viral like syndrome, with excessive coughing.  Urinalysis performed in the ER earlier this week negative for signs  of UTI.  Discussed revisiting treatment with medications, but can choose a different one to limit side effects.  Will prescribe Detrol. Will f/u at next visit in 4 weeks.  - Discussed that if patient is noting issues with her current care situation, she should discuss this with a care coordinator, her PCP, or a Education officer, museum.  Also encouraged follow up with her current facility's manager. Patient notes she is also working to get the issues worked out with her social security checks as they were "being held by the hospital".   A total of 45 minutes were spent face-to-face with the patient during this encounter and over half of that time involved counseling and coordination of care.   Rubie Maid, MD [facility]

## 2016-12-05 ENCOUNTER — Encounter: Payer: Self-pay | Admitting: Family Medicine

## 2016-12-05 ENCOUNTER — Ambulatory Visit (INDEPENDENT_AMBULATORY_CARE_PROVIDER_SITE_OTHER): Payer: Medicare Other | Admitting: Family Medicine

## 2016-12-05 ENCOUNTER — Other Ambulatory Visit: Payer: Self-pay | Admitting: Family Medicine

## 2016-12-05 VITALS — BP 128/69 | HR 59 | Temp 97.9°F | Resp 16 | Ht 63.0 in | Wt 134.6 lb

## 2016-12-05 DIAGNOSIS — E871 Hypo-osmolality and hyponatremia: Secondary | ICD-10-CM | POA: Diagnosis not present

## 2016-12-05 DIAGNOSIS — E860 Dehydration: Secondary | ICD-10-CM | POA: Diagnosis not present

## 2016-12-05 DIAGNOSIS — Z8742 Personal history of other diseases of the female genital tract: Secondary | ICD-10-CM

## 2016-12-05 DIAGNOSIS — R197 Diarrhea, unspecified: Secondary | ICD-10-CM | POA: Diagnosis not present

## 2016-12-05 NOTE — Progress Notes (Addendum)
Subjective:    Patient ID: Angela [name], female    DOB: 01/03/1951, 66 y.o.   MRN: [id number]  Angela [name] is a 66 y.o. female presenting on 12/05/2016 for Hospitalization Follow-up (had postmanoposal bleeding pt feels very sick dizzy, on the way pt passed out and had diarrhea, as per pt she may be dehydrated)   HPI   ED FOLLOW-UP VISIT  Hospital/Location: [facility] ED x 2 and [facility] ED x 2 Date of ED Visit: 11/10, 11/11, 11/12, 11/13  Reason for Presenting to ED: Cough, URI, Postmenopausal Bleeding, dehydration, Hyponatremia  FOLLOW-UP - ED provider note and record have been reviewed - Patient presents today about 7 days after last ED visit. See ED notes in chart and in careeverywhere for full details including [facility] ED visits. Patient had concerns of URI cough and possible pneumonia, and had multiple tests done including x-ray negative last 11/28/16, had lab work, pelvic exam completed, had US pelvic, and CT Abd Pelvis, without acute changes. Labs showed normal TSH, low sodium ranging from 125 up to 131-132, had IV rehydration. She was discharged after each visit to her homeless shelter. - Today reports overall has not done well after ED visits, still has problems now feeling more tired and dehydrated, trying to keep up with fluid intake with powerade/gatorade and water, tolerating PO well. - Describes her concerns with some diarrhea and overall not feeling well, concern she has low sodium again - Last seen Dr Gabriel Carina [facility] Endocrinology on 12/04/16, and discussed SIADH chronic hyponatremia without any changes, only continue oral rehydration - Saw Dr Marcelline Mates [facility]) on 11/16, changed to alternative hormonal vaginal treatment for bleeding, switched from premarin and prior estradiol to Prasterone intravaginal applicator  Additionally, she describes current living situation near [place], living with a friend, no longer in shelter, has limited transportation due to their work hours, she ideally  would like to get back to [facility] where she is more familiar with her other specialists and doctors and used to live for years. She has seen [facility] and received assistance from Renato Shin to proceed with FL2 form, she is requesting more advanced care possibly SNF since she is unable to care for her self and has very limited support system, has not been doing well overall and multiple hospital visits, previously homeless living in shelter.  Depression screen [facility] 2/9 11/17/2016 11/17/2016  Decreased Interest 3 3  Down, Depressed, Hopeless 3 3  PHQ - 2 Score 6 6  Altered sleeping 3 3  Tired, decreased energy 3 3  Change in appetite 3 3  Feeling bad or failure about yourself  3 3  Trouble concentrating 3 3  Moving slowly or fidgety/restless 0 0  Suicidal thoughts 0 0  PHQ-9 Score 21 21  Difficult doing work/chores Somewhat difficult Very difficult    Social History   Tobacco Use  . Smoking status: Former Smoker    Last attempt to quit: 11/17/1976    Years since quitting: 40.0  . Smokeless tobacco: Never Used  Substance Use Topics  . Alcohol use: No  . Drug use: No    Review of Systems Per HPI unless specifically indicated above     Objective:    BP 128/69   Pulse (!) 59   Temp 97.9 F (36.6 C) (Oral)   Resp 16   Ht 5\' 3"  (1.6 m)   Wt 134 lb 9.6 oz (61.1 kg)   BMI 23.84 kg/m   Wt Readings from Last 3 Encounters:  12/06/16  134 lb (60.8 kg)  12/05/16 134 lb 9.6 oz (61.1 kg)  12/01/16 134 lb 11.2 oz (61.1 kg)    Physical Exam  Constitutional: She is oriented to person, place, and time. She appears well-developed and well-nourished. No distress.  Chronically ill appearing, mildly uncomfortable, cooperative, has rolling walker, has gatorade drinking during some of interview  HENT:  Head: Normocephalic and atraumatic.  Mouth/Throat: Oropharynx is clear and moist.  Mild dry mucus mem  Eyes: Conjunctivae are normal. Right eye exhibits no discharge. Left eye exhibits no  discharge.  Neck: Normal range of motion.  Cardiovascular: Normal rate, regular rhythm, normal heart sounds and intact distal pulses.  No murmur heard. Pulmonary/Chest: Effort normal and breath sounds normal. No respiratory distress. She has no wheezes. She has no rales.  No focal abnormality  Abdominal: Soft. Bowel sounds are normal. She exhibits no distension. There is no tenderness. There is no rebound.  Musculoskeletal: She exhibits no edema.  Able to stand from seated, walk short distance without rolling walker  Neurological: She is alert and oriented to person, place, and time.  Distal sensation reduced to light touch, stable unchanged  Skin: Skin is warm and dry. She is not diaphoretic. No erythema.  Psychiatric: She has a normal mood and affect. Her behavior is normal.  At baseline, somewhat disheveled but overall groomed, good eye contact, normal speech and thoughts. Good insight into medical history and conditions. Appears mildly anxious. No labile mood.  Nursing note and vitals reviewed.    Chemistry      Component Value Date/Time   NA 132 (L) 12/06/2016 1404   NA 126 (L) 12/05/2016 1330   NA 132 (L) 04/16/2014 0519   K 3.9 12/06/2016 1404   K 3.8 04/16/2014 0519   CL 100 (L) 12/06/2016 1404   CL 99 (L) 04/16/2014 0519   CO2 22 12/06/2016 1404   CO2 28 04/16/2014 0519   BUN 10 12/06/2016 1404   BUN 11 12/05/2016 1330   BUN 6 04/16/2014 0519   CREATININE 0.70 12/06/2016 1404   CREATININE 0.51 04/16/2014 0519      Component Value Date/Time   CALCIUM 9.2 12/06/2016 1404   CALCIUM 7.6 (L) 04/16/2014 0519   ALKPHOS 73 11/27/2016 1751   ALKPHOS 65 04/14/2014 1512   AST 36 11/27/2016 1751   AST 31 04/14/2014 1512   ALT 29 11/27/2016 1751   ALT 17 04/14/2014 1512   BILITOT 0.3 11/27/2016 1751   BILITOT 0.2 03/30/2016 1256   BILITOT <0.1 (L) 04/14/2014 1512     CBC Latest Ref Rng & Units 12/06/2016 12/05/2016 11/27/2016  WBC 3.6 - 11.0 K/uL 6.2 7.3 8.8  Hemoglobin  12.0 - 16.0 g/dL 13.8 12.3 13.4  Hematocrit 35.0 - 47.0 % 40.1 37.1 38.3  Platelets 150 - 440 K/uL 436 448(H) 562(H)       Assessment & Plan:   Problem List Items Addressed This Visit    Chronic hyponatremia - Primary   History of postmenopausal bleeding    Other Visit Diagnoses    Dehydration, mild       Diarrhea, unspecified type          Clinically appears mildly dehydrated and concern for possible hyponatremia again acute on chronic, with reported syncopal episode on way to clinic, recent viral illness URI vs viral gastro with diarrhea as well. - Trying to keep up with PO intake, rehydration powerade but limited PO at times and limited access to rehydration treatments at home - Now  URI symptoms improved, lungs clear, afebrile, no focal infectious etiology present - Prior Na levels range from 125 to 132, some fluctuation, within past 7-10 days, ED visits and Endocrinology  Plan: 1. Check STAT labs BMET, CBC - initially unable to get blood drawn from office due to difficulty with phlebotomy discussed this before her leaving, offered [facility] and other options, reviewed results within 24 hours, contacted patient see result note, concern with Na 126, she was advised on 11/21 to seek more immediate treatment if cannot keep up with PO intake, she was able to get to [facility] ED and evaluated had labs re-check show Na 132 improved, no other acute clinical concern and she was discharged - Recommended to patient that she may continue follow-up with [facility], but she states that she most likely was told not much else they can continue to do for her, and she was asking about other 2nd opinion, we considered possible Nephrology consult for hyponatremia / SIADH to review this with her further see if other alternatives to avoid frequent ED visits. Agreed to proceed with consult, placed for [place]  Ultimately, I believe with patients complex mental health history, chronic pain and physical  limitations, and also chronic medical conditions of hypothyroidism, chronic hyponatremia, she has demonstrated difficulty with current living situation, was homeless before, has limited support and transportation may benefit from potential admission to skilled nursing facility or other elevated level of care available to her.  She prefers to return to [facility] area for this if possible.  No orders of the defined types were placed in this encounter.  Orders Placed This Encounter  Procedures  . Ambulatory referral to Nephrology    Referral Priority:   Routine    Referral Type:   Consultation    Referral Reason:   Specialty Services Required    Referred to Provider:   Lavonia Dana, MD    Requested Specialty:   Internal Medicine    Number of Visits Requested:   1     Follow up plan: Return in about 2 weeks (around 12/19/2016), or if symptoms worsen or fail to improve, for hyponatremia.  Additionally FMLA paperwork completed 12/06/16 - to be faxed.  Nobie Putnam, DO [facility] 12/06/2016, 6:05 PM

## 2016-12-05 NOTE — Patient Instructions (Addendum)
Thank you for coming to the clinic today.  1.  We will fill out FL2 - will call you when ready, and can fax back to [facility] today. Will call with results later.  If low sodium or worse dehydration or pass out may need to go back to hospital ED, possibly [facility] in [place]  Please schedule a Follow-up Appointment to: Return in about 2 weeks (around 12/19/2016), or if symptoms worsen or fail to improve, for hyponatremia.  If you have any other questions or concerns, please feel free to call the clinic or send a message through [place]. You may also schedule an earlier appointment if necessary.  Additionally, you may be receiving a survey about your experience at our clinic within a few days to 1 week by e-mail or mail. We value your feedback.  Nobie Putnam, DO [place]

## 2016-12-06 ENCOUNTER — Encounter: Payer: Self-pay | Admitting: Emergency Medicine

## 2016-12-06 ENCOUNTER — Other Ambulatory Visit: Payer: Self-pay

## 2016-12-06 ENCOUNTER — Encounter: Payer: Self-pay | Admitting: Family Medicine

## 2016-12-06 ENCOUNTER — Telehealth: Payer: Self-pay | Admitting: Obstetrics and Gynecology

## 2016-12-06 ENCOUNTER — Emergency Department
Admission: EM | Admit: 2016-12-06 | Discharge: 2016-12-06 | Disposition: A | Discharge status: Home / Self Care | Payer: Medicare Other | Attending: Emergency Medicine | Admitting: Emergency Medicine

## 2016-12-06 DIAGNOSIS — Z91010 Allergy to peanuts: Secondary | ICD-10-CM | POA: Diagnosis not present

## 2016-12-06 DIAGNOSIS — I10 Essential (primary) hypertension: Secondary | ICD-10-CM | POA: Diagnosis not present

## 2016-12-06 DIAGNOSIS — E039 Hypothyroidism, unspecified: Secondary | ICD-10-CM | POA: Insufficient documentation

## 2016-12-06 DIAGNOSIS — Z8673 Personal history of transient ischemic attack (TIA), and cerebral infarction without residual deficits: Secondary | ICD-10-CM | POA: Diagnosis not present

## 2016-12-06 DIAGNOSIS — R531 Weakness: Secondary | ICD-10-CM | POA: Diagnosis not present

## 2016-12-06 DIAGNOSIS — G8929 Other chronic pain: Secondary | ICD-10-CM | POA: Insufficient documentation

## 2016-12-06 DIAGNOSIS — Z87891 Personal history of nicotine dependence: Secondary | ICD-10-CM | POA: Diagnosis not present

## 2016-12-06 DIAGNOSIS — E871 Hypo-osmolality and hyponatremia: Secondary | ICD-10-CM | POA: Diagnosis not present

## 2016-12-06 DIAGNOSIS — Z79899 Other long term (current) drug therapy: Secondary | ICD-10-CM | POA: Diagnosis not present

## 2016-12-06 LAB — BASIC METABOLIC PANEL
Anion gap: 10 (ref 5–15)
BUN: 10 mg/dL (ref 6–20)
CHLORIDE: 100 mmol/L — AB (ref 101–111)
CO2: 22 mmol/L (ref 22–32)
CREATININE: 0.7 mg/dL (ref 0.44–1.00)
Calcium: 9.2 mg/dL (ref 8.9–10.3)
GFR calc Af Amer: >60 mL/min (ref >60)
GFR calc non Af Amer: >60 mL/min (ref >60)
GLUCOSE: 78 mg/dL (ref 65–99)
POTASSIUM: 3.9 mmol/L (ref 3.5–5.1)
SODIUM: 132 mmol/L — AB (ref 135–145)

## 2016-12-06 LAB — CBC WITH DIFFERENTIAL/PLATELET
Basophils Absolute: 0 10*3/uL (ref 0–0.1)
Basophils Relative: 1 %
EOS ABS: 0.1 10*3/uL (ref 0–0.7)
Eosinophils Relative: 2 %
HEMATOCRIT: 40.1 % (ref 35.0–47.0)
HEMOGLOBIN: 13.8 g/dL (ref 12.0–16.0)
LYMPHS ABS: 1.5 10*3/uL (ref 1.0–3.6)
LYMPHS PCT: 24 %
MCH: 31.8 pg (ref 26.0–34.0)
MCHC: 34.4 g/dL (ref 32.0–36.0)
MCV: 92.5 fL (ref 80.0–100.0)
MONOS PCT: 10 %
Monocytes Absolute: 0.6 10*3/uL (ref 0.2–0.9)
NEUTROS ABS: 3.9 10*3/uL (ref 1.4–6.5)
NEUTROS PCT: 63 %
Platelets: 436 10*3/uL (ref 150–440)
RBC: 4.33 MIL/uL (ref 3.80–5.20)
RDW: 14 % (ref 11.5–14.5)
WBC: 6.2 10*3/uL (ref 3.6–11.0)

## 2016-12-06 LAB — URINALYSIS, COMPLETE (UACMP) WITH MICROSCOPIC
Bacteria, UA: NONE SEEN
Bilirubin Urine: NEGATIVE
GLUCOSE, UA: NEGATIVE mg/dL
Hgb urine dipstick: NEGATIVE
KETONES UR: NEGATIVE mg/dL
LEUKOCYTES UA: NEGATIVE
Nitrite: NEGATIVE
PROTEIN: NEGATIVE mg/dL
SQUAMOUS EPITHELIAL / LPF: NONE SEEN
Specific Gravity, Urine: 1.001 — ABNORMAL LOW (ref 1.005–1.030)
pH: 6 (ref 5.0–8.0)

## 2016-12-06 LAB — TROPONIN I

## 2016-12-06 MED ORDER — FLUCONAZOLE 150 MG PO TABS: 150.0000 mg | ORAL_TABLET | Freq: Once | ORAL | 2 refills | Status: AC

## 2016-12-06 MED ORDER — IPRATROPIUM-ALBUTEROL 0.5-2.5 (3) MG/3ML IN SOLN
3.0000 mL | Freq: Once | RESPIRATORY_TRACT | Status: AC
  Administered 2016-12-06: 3 mL via RESPIRATORY_TRACT
  Filled 2016-12-06: qty 3

## 2016-12-06 MED ORDER — SODIUM CHLORIDE 0.9 % IV BOLUS (SEPSIS)
1000.0000 mL | Freq: Once | INTRAVENOUS | Status: AC
  Administered 2016-12-06: 1000 mL via INTRAVENOUS

## 2016-12-06 NOTE — ED Triage Notes (Signed)
Pt arrived via [name] for reports of low sodium, generalized weakness, dizziness and decreased appetite. EMS reports VSS, CBG 114. Pt reports recently has been treated for vaginal bleeding. Pt reports diarrhea yesterday, denies vomiting. Pt alert and oriented on arrival.

## 2016-12-06 NOTE — ED Notes (Signed)
Pt wheeled to lobby to wait for ride. Pt placed near a charger so she could charge her cell phone. Pt given applesauce, graham crackers, and ginger ale.

## 2016-12-06 NOTE — Addendum Note (Signed)
Addended by: Olin Hauser on: 12/06/2016 06:13 PM   Modules accepted: Orders

## 2016-12-06 NOTE — Discharge Instructions (Signed)
Please seek medical attention for any high fevers, chest pain, shortness of breath, change in behavior, persistent vomiting, bloody stool or any other new or concerning symptoms.  

## 2016-12-06 NOTE — Telephone Encounter (Signed)
The Diflucan comes as a 150 mg tablet.  She can have a dose with 2 refills.

## 2016-12-06 NOTE — ED Provider Notes (Signed)
[facility] Emergency Department Provider Note  ____________________________________________   I have reviewed the triage vital signs and the nursing notes.   HISTORY  Chief Complaint Weakness; Low sodium; and Diarrhea   History limited by: Not Limited   HPI Angela [name] is a 66 y.o. female who presents to the emergency department today because of concern for hyponatremia  DURATION:chronic TIMING: had labs drawn yesterday with na of 126 CONTEXT: patient states she has a history of chronic hyponatremia and that it fluctuates. States that had it drawn yesterday by PCP. Was told to come to ED today for further work up and management. ASSOCIATED SYMPTOMS: dizziness, weakness  Per medical record review patient has a history of hyponatremia.   Past Medical History:  Diagnosis Date  . Allergic rhinitis   . Anemia   . Blurred vision   . Depression   . Diverticulosis   . Endometriosis   . Falls   . GERD (gastroesophageal reflux disease)   . Hernia, hiatal   . Hypothyroidism   . IBS (irritable bowel syndrome)   . Malignant neoplasm of skin   . Migraine   . Neuropathy   . PTSD (post-traumatic stress disorder)   . Thyroid disease     Patient Active Problem List   Diagnosis Date Noted  . IBS (irritable bowel syndrome) 11/17/2016  . Bilateral carpal tunnel syndrome 11/17/2016  . Plantar fasciitis, bilateral 11/17/2016  . Chronic pain syndrome 11/17/2016  . Chronic hyponatremia 11/17/2016  . PTSD (post-traumatic stress disorder) 11/17/2016  . Hernia, hiatal 11/17/2016  . Vitamin D deficiency 11/17/2016  . Vitamin B12 deficiency 11/17/2016  . SIADH (syndrome of inappropriate ADH production) ([place]) 09/08/2016  . Cervical spondylosis with myelopathy 05/24/2016  . Post-concussion headache 05/24/2016  . MCI (mild cognitive impairment) with memory loss 03/30/2016  . Gait abnormality 03/30/2016  . Chronic bipolar affective disorder ([place]) 01/24/2016   . Anemia, iron deficiency 06/24/2015  . Endometriosis 06/24/2015  . Essential hypertension 06/24/2015  . History of postmenopausal bleeding 05/22/2015  . History of TIA (transient ischemic attack) 04/20/2015  . Hypothyroidism 04/20/2015  . GERD (gastroesophageal reflux disease) 04/20/2015  . Cervical disc disorder with radiculopathy 03/25/2015  . Chronic neck pain 03/25/2015  . Pelvic pain in female 10/30/2014  . History of skin cancer 06/30/2014  . Chronic headaches 12/16/2013  . Neuropathy 12/16/2013    Past Surgical History:  Procedure Laterality Date  . APPENDECTOMY    . CHOLECYSTECTOMY    . CHOLECYSTECTOMY, LAPAROSCOPIC    . ESOPHAGOGASTRODUODENOSCOPY (EGD) WITH PROPOFOL N/A 03/29/2015   Procedure: ESOPHAGOGASTRODUODENOSCOPY (EGD) WITH PROPOFOL;  Surgeon: Josefine Class, MD;  Location: [facility] ENDOSCOPY;  Service: Endoscopy;  Laterality: N/A;  . HERNIA REPAIR    . laproscopy    . TONSILLECTOMY    . uteral suspension      Prior to Admission medications   Medication Sig Start Date End Date Taking? Authorizing Provider  amLODipine (NORVASC) 5 MG tablet Take 1 tablet (5 mg total) by mouth 2 (two) times daily. 11/17/16   Karamalegos, Devonne Doughty, DO  calcium carbonate (OS-CAL) 600 MG TABS tablet Take 1 tablet (600 mg total) by mouth 2 (two) times daily with a meal. Patient taking differently: Take 600 mg 3 (three) times daily by mouth.  10/28/16   Shary Decamp, PA-C  conjugated estrogens (PREMARIN) vaginal cream Place 1 Applicatorful vaginally daily. Patient taking differently: Place 1 Applicatorful vaginally 2 (two) times daily.  07/20/16   Rubie Maid, MD  cycloSPORINE (RESTASIS)  0.05 % ophthalmic emulsion Place 1 drop into both eyes 2 (two) times daily. 11/17/16   Karamalegos, Devonne Doughty, DO  Diclofenac Sodium (SOLARAZE) 3 % GEL Place 1 application 3 (three) times daily onto the skin.     [provider]  ferrous sulfate (FERROUSUL) 325 (65 FE) MG tablet Take 1  tablet (325 mg total) by mouth daily with breakfast. 10/28/16   Shary Decamp, PA-C  gabapentin (NEURONTIN) 100 MG capsule Take 1-2 capsules (100-200 mg total) by mouth 3 (three) times daily. 11/17/16   Karamalegos, Devonne Doughty, DO  levothyroxine (SYNTHROID, LEVOTHROID) 100 MCG tablet Take 1 tablet (100 mcg total) by mouth daily before breakfast. 11/17/16   Karamalegos, Devonne Doughty, DO  linaclotide (LINZESS) 290 MCG CAPS capsule Take 1 capsule (290 mcg total) by mouth daily before breakfast. Patient taking differently: Take 290 mcg every other day by mouth.  11/17/16   Karamalegos, Devonne Doughty, DO  magnesium oxide (MAG-OX) 400 MG tablet Take 400 mg by mouth daily with breakfast.    [provider]  nystatin cream (MYCOSTATIN) Apply 1 application 2 (two) times daily as needed topically for dry skin.     [provider]  pantoprazole (PROTONIX) 40 MG tablet Take 1 tablet (40 mg total) by mouth daily before breakfast. 11/17/16   Karamalegos, Devonne Doughty, DO  Polyethyl Glycol-Propyl Glycol (SYSTANE OP) Place 1 drop into both eyes daily as needed (dry eyes).    [provider]  polyethylene glycol powder (GLYCOLAX/MIRALAX) powder Take 17-34 g by mouth daily. 11/17/16   Karamalegos, Devonne Doughty, DO  Prasterone (INTRAROSA) 6.5 MG INST Place 1 Applicatorful daily vaginally. 12/01/16   Rubie Maid, MD  Pyridoxine HCl (VITAMIN B-6 PO) Take 1 tablet by mouth 2 (two) times daily.    [provider]  ranitidine (ZANTAC) 150 MG tablet Take 150 mg by mouth daily before supper.    [provider]  tiZANidine (ZANAFLEX) 2 MG tablet Take 0.5-1 tablets (1-2 mg total) by mouth 2 (two) times daily as needed for muscle spasms. 11/17/16   Olin Hauser, DO  tolterodine (DETROL LA) 2 MG 24 hr capsule Take 1 capsule (2 mg total) daily by mouth. 12/01/16   Rubie Maid, MD  White Petrolatum-Mineral Oil (GENTEAL TEARS NIGHT-TIME) OINT Place 1 application into both eyes at  bedtime.    [provider]    Allergies Bee venom; Darvon [propoxyphene]; Dicyclomine; Other; Oxycodone-acetaminophen; Peanuts [peanut oil]; Penicillins; Sulfa antibiotics; Sulfacetamide sodium; Ultram [tramadol]; Amikacin; Aspirin; Doxycycline; Haloperidol; Hymenoptera venom preparations; Imipramine; Keflex [cephalexin]; Sulfur; Adhesive [tape]; Hydroxyzine; and Prednisone  Family History  Problem Relation Age of Onset  . Heart disease Father   . Cancer Mother        lung  . Urolithiasis Neg Hx   . Kidney disease Neg Hx   . Kidney cancer Neg Hx   . Prostate cancer Neg Hx     Social History Social History   Tobacco Use  . Smoking status: Former Smoker    Last attempt to quit: 11/17/1976    Years since quitting: 40.0  . Smokeless tobacco: Never Used  Substance Use Topics  . Alcohol use: No  . Drug use: No    Review of Systems Constitutional: Positive for weakness.  Eyes: No visual changes. ENT: No sore throat. Cardiovascular: Denies chest pain. Respiratory: Positive for some chronic sob. Gastrointestinal: No abdominal pain.  No nausea, no vomiting.  No diarrhea.   Genitourinary: Negative for dysuria. Musculoskeletal: Negative for back pain.  Skin: Negative for rash. Neurological: Negative for headaches, focal weakness or numbness.  ____________________________________________   PHYSICAL EXAM:  VITAL SIGNS: ED Triage Vitals  Enc Vitals Group     BP 12/06/16 1411 140/81     Pulse Rate 12/06/16 1411 62     Resp 12/06/16 1411 15     Temp 12/06/16 1425 98.2 F (36.8 C)     Temp Source 12/06/16 1425 Oral     SpO2 12/06/16 1411 100 %     Weight 12/06/16 1357 134 lb (60.8 kg)     Height 12/06/16 1357 5\' 3"  (1.6 m)     Head Circumference --      Peak Flow --      Pain Score 12/06/16 1357 0   Constitutional: Alert and oriented. Well appearing and in no distress. Eyes: Conjunctivae are normal.  ENT   Head: Normocephalic and atraumatic.   Nose: No  congestion/rhinnorhea.   Mouth/Throat: Mucous membranes are moist.   Neck: No stridor. Hematological/Lymphatic/Immunilogical: No cervical lymphadenopathy. Cardiovascular: Normal rate, regular rhythm.  No murmurs, rubs, or gallops. Respiratory: Normal respiratory effort without tachypnea nor retractions. Breath sounds are clear and equal bilaterally. No wheezes/rales/rhonchi. Gastrointestinal: Soft and non tender. No rebound. No guarding.  Genitourinary: Deferred Musculoskeletal: Normal range of motion in all extremities. No lower extremity edema. Neurologic:  Normal speech and language. No gross focal neurologic deficits are appreciated.  Skin:  Skin is warm, dry and intact. No rash noted. Psychiatric: Mood and affect are normal. Speech and behavior are normal. Patient exhibits appropriate insight and judgment.  ____________________________________________    LABS (pertinent positives/negatives)  CBC wnl BMP na 132, cl 100, k 3.9 Trop <0.03 UA not consistent with infection, ketones negative  ____________________________________________   EKG  I, Nance Pear, attending physician, personally viewed and interpreted this EKG  EKG Time: 1404 Rate: 61 Rhythm: normal sinus rhythm Axis: normal Intervals: qtc 436 QRS: narrow ST changes: no st elevation Impression: probably left atrial enlargement  ____________________________________________    RADIOLOGY  None  ____________________________________________   PROCEDURES  Procedures  ____________________________________________   INITIAL IMPRESSION / ASSESSMENT AND PLAN / ED COURSE  Pertinent labs & imaging results that were available during my care of the patient were reviewed by me and considered in my medical decision making (see chart for details).  Patient presented to the emergency department because of concern for weakness and hyponatremia. Weakness could be due to hyponatremia, anemia, infection,  cardiac cause amongst other etiologies. The patient's sodium level today 132. Good improvement over lab drawn yesterday. Patient has a history of chronic hyponatremia. No signs of infection. No anemia. At this point do not feel patient requires inpatient work up for management given current sodium level. Patient states she has been talking with her primary care physician about seeing a nephrologist. Discussed importance of continuing to work with her PCP.  _________________________________________   FINAL CLINICAL IMPRESSION(S) / ED DIAGNOSES  Final diagnoses:  Weakness  Hyponatremia     Note: This dictation was prepared with Dragon dictation. Any transcriptional errors that result from this process are unintentional     Nance Pear, MD 12/06/16 1513

## 2016-12-06 NOTE — Telephone Encounter (Signed)
Patient called requesting diflucan 200 mg (2 pills) sent to the [facility] on [address]. She was on an antibiotic for an upper respiratory infection. Thanks

## 2016-12-06 NOTE — ED Notes (Signed)
Pt on toilet when this RN entered room.  Ambulatory around bed and to bed from toilet with steady gait. Able to change self into gown.

## 2016-12-06 NOTE — ED Notes (Signed)
Pt stating she feels like she can't breath. Pt has mask covering face. Pt breathing 16 times a minute. No distress noted. Pt instructed to take slow deep breaths. Pt stating "I'm blacking out." No LOC. Pt remains talking in complete sentences while she states she is "blacking out". This Rn remaining with pt while she ambulates her self to toilet and back to stretcher.

## 2016-12-06 NOTE — Telephone Encounter (Signed)
Unable to reach pt left vm informing pt that rx was sent to pharmacy and also that we would not be open on Thursday or Friday

## 2016-12-06 NOTE — ED Notes (Addendum)
Have attempted to DC patient 3 different times since pt has been up for DC. Pt is refusing to get up from stretcher. Pt states her heart is racing. Pt placed on pulse ox with a HR of 65 and oxygen of 98. Pt asking to see a doctor.

## 2016-12-08 LAB — BASIC METABOLIC PANEL
BUN/Creatinine Ratio: 17 (ref 12–28)
BUN: 11 mg/dL (ref 8–27)
CALCIUM: 9.3 mg/dL (ref 8.7–10.3)
CO2: 23 mmol/L (ref 20–29)
Chloride: 93 mmol/L — ABNORMAL LOW (ref 96–106)
Creatinine, Ser: 0.66 mg/dL (ref 0.57–1.00)
GFR calc Af Amer: 105 mL/min/{1.73_m2} (ref >59)
GFR, EST NON AFRICAN AMERICAN: 91 mL/min/{1.73_m2} (ref >59)
Glucose: 74 mg/dL (ref 65–99)
POTASSIUM: 4.2 mmol/L (ref 3.5–5.2)
Sodium: 126 mmol/L — ABNORMAL LOW (ref 134–144)

## 2016-12-08 LAB — CBC WITH DIFFERENTIAL/PLATELET
Basophils Absolute: 0 10*3/uL (ref 0.0–0.2)
Basos: 0 %
EOS (ABSOLUTE): 0.1 10*3/uL (ref 0.0–0.4)
EOS: 2 %
HEMATOCRIT: 37.1 % (ref 34.0–46.6)
HEMOGLOBIN: 12.3 g/dL (ref 11.1–15.9)
IMMATURE GRANS (ABS): 0 10*3/uL (ref 0.0–0.1)
IMMATURE GRANULOCYTES: 1 %
LYMPHS: 22 %
Lymphocytes Absolute: 1.6 10*3/uL (ref 0.7–3.1)
MCH: 31.1 pg (ref 26.6–33.0)
MCHC: 33.2 g/dL (ref 31.5–35.7)
MCV: 94 fL (ref 79–97)
MONOCYTES: 10 %
Monocytes Absolute: 0.7 10*3/uL (ref 0.1–0.9)
NEUTROS PCT: 65 %
Neutrophils Absolute: 4.8 10*3/uL (ref 1.4–7.0)
Platelets: 448 10*3/uL — ABNORMAL HIGH (ref 150–379)
RBC: 3.96 x10E6/uL (ref 3.77–5.28)
RDW: 14 % (ref 12.3–15.4)
WBC: 7.3 10*3/uL (ref 3.4–10.8)

## 2016-12-11 ENCOUNTER — Telehealth: Payer: Self-pay | Admitting: Family Medicine

## 2016-12-11 NOTE — Telephone Encounter (Signed)
Pt needs refills on amlodipine, levothyroxine, linzess sent to [place].  Pt was in ER last week and she said she needs referrals to Cardio and nephrology at [facility].

## 2016-12-11 NOTE — Telephone Encounter (Signed)
Left detail message. 

## 2016-12-12 ENCOUNTER — Telehealth: Payer: Self-pay | Admitting: Family Medicine

## 2016-12-12 NOTE — Telephone Encounter (Signed)
Pt just informed me that she has moved.  Her address has been updated and she asked for copy of FL2, referrals and appts and any other correspondence that has been mailed to old address be sent to new address.

## 2016-12-13 ENCOUNTER — Other Ambulatory Visit: Payer: Self-pay | Admitting: Family Medicine

## 2016-12-13 ENCOUNTER — Other Ambulatory Visit: Payer: Self-pay

## 2016-12-13 ENCOUNTER — Telehealth: Payer: Self-pay | Admitting: Family Medicine

## 2016-12-13 DIAGNOSIS — K581 Irritable bowel syndrome with constipation: Secondary | ICD-10-CM

## 2016-12-13 DIAGNOSIS — I10 Essential (primary) hypertension: Secondary | ICD-10-CM

## 2016-12-13 DIAGNOSIS — M5010 Cervical disc disorder with radiculopathy, unspecified cervical region: Secondary | ICD-10-CM

## 2016-12-13 DIAGNOSIS — G5603 Carpal tunnel syndrome, bilateral upper limbs: Secondary | ICD-10-CM

## 2016-12-13 DIAGNOSIS — E039 Hypothyroidism, unspecified: Secondary | ICD-10-CM

## 2016-12-13 DIAGNOSIS — K219 Gastro-esophageal reflux disease without esophagitis: Secondary | ICD-10-CM

## 2016-12-13 DIAGNOSIS — G894 Chronic pain syndrome: Secondary | ICD-10-CM

## 2016-12-13 MED ORDER — CYCLOSPORINE 0.05 % OP EMUL: 1.0000 [drp] | Freq: Two times a day (BID) | OPHTHALMIC | 5 refills | Status: DC

## 2016-12-13 MED ORDER — TIZANIDINE HCL 2 MG PO TABS: 1.0000 mg | ORAL_TABLET | Freq: Two times a day (BID) | ORAL | 1 refills | Status: DC | PRN

## 2016-12-13 MED ORDER — LINACLOTIDE 290 MCG PO CAPS
290.0000 ug | ORAL_CAPSULE | Freq: Every day | ORAL | 1 refills | Status: DC
Start: 2016-12-13 — End: 2017-08-15

## 2016-12-13 MED ORDER — LEVOTHYROXINE SODIUM 100 MCG PO TABS: 100.0000 ug | ORAL_TABLET | Freq: Every day | ORAL | 1 refills | Status: DC

## 2016-12-13 MED ORDER — AMLODIPINE BESYLATE 5 MG PO TABS
5.0000 mg | ORAL_TABLET | Freq: Two times a day (BID) | ORAL | 1 refills | Status: DC
Start: 2016-12-13 — End: 2017-08-15

## 2016-12-13 MED ORDER — PANTOPRAZOLE SODIUM 40 MG PO TBEC: 40.0000 mg | DELAYED_RELEASE_TABLET | Freq: Every day | ORAL | 1 refills | Status: DC

## 2016-12-13 MED ORDER — HYDROCORTISONE 2.5 % RE CREA: 1.0000 "application " | TOPICAL_CREAM | Freq: Two times a day (BID) | RECTAL | 1 refills | Status: DC

## 2016-12-13 MED ORDER — LINACLOTIDE 290 MCG PO CAPS: 290.0000 ug | ORAL_CAPSULE | Freq: Every day | ORAL | 1 refills | Status: DC

## 2016-12-13 MED ORDER — AMLODIPINE BESYLATE 5 MG PO TABS: 5.0000 mg | ORAL_TABLET | Freq: Two times a day (BID) | ORAL | 1 refills | Status: DC

## 2016-12-13 NOTE — Telephone Encounter (Signed)
Pt asked for someone to give her a call about medication [phone]

## 2016-12-13 NOTE — Telephone Encounter (Signed)
Frederich Cha CMA called patient back and reviewed her medications, she was given printed paper rx on 11/2 but now requesting all meds sent to new pharmacy. E-scripts sent.  Nobie Putnam, [facility] 12/13/2016, 12:49 PM

## 2016-12-19 ENCOUNTER — Ambulatory Visit: Payer: Medicare Other | Admitting: Podiatry

## 2016-12-26 ENCOUNTER — Encounter: Payer: Self-pay | Admitting: Emergency Medicine

## 2016-12-26 ENCOUNTER — Emergency Department: Payer: Medicare Other

## 2016-12-26 ENCOUNTER — Ambulatory Visit: Payer: Medicare Other | Admitting: Podiatry

## 2016-12-26 ENCOUNTER — Emergency Department
Admission: EM | Admit: 2016-12-26 | Discharge: 2016-12-26 | Disposition: A | Discharge status: Home / Self Care | Payer: Medicare Other | Attending: Emergency Medicine | Admitting: Emergency Medicine

## 2016-12-26 ENCOUNTER — Other Ambulatory Visit: Payer: Self-pay

## 2016-12-26 DIAGNOSIS — J441 Chronic obstructive pulmonary disease with (acute) exacerbation: Secondary | ICD-10-CM | POA: Diagnosis not present

## 2016-12-26 DIAGNOSIS — G8929 Other chronic pain: Secondary | ICD-10-CM | POA: Diagnosis not present

## 2016-12-26 DIAGNOSIS — Z79899 Other long term (current) drug therapy: Secondary | ICD-10-CM | POA: Insufficient documentation

## 2016-12-26 DIAGNOSIS — Z87891 Personal history of nicotine dependence: Secondary | ICD-10-CM | POA: Diagnosis not present

## 2016-12-26 DIAGNOSIS — R079 Chest pain, unspecified: Secondary | ICD-10-CM | POA: Diagnosis present

## 2016-12-26 DIAGNOSIS — E039 Hypothyroidism, unspecified: Secondary | ICD-10-CM | POA: Diagnosis not present

## 2016-12-26 DIAGNOSIS — I10 Essential (primary) hypertension: Secondary | ICD-10-CM | POA: Insufficient documentation

## 2016-12-26 DIAGNOSIS — J449 Chronic obstructive pulmonary disease, unspecified: Secondary | ICD-10-CM

## 2016-12-26 LAB — CBC WITH DIFFERENTIAL/PLATELET
Basophils Absolute: 0 10*3/uL (ref 0–0.1)
Basophils Relative: 1 %
EOS PCT: 2 %
Eosinophils Absolute: 0.1 10*3/uL (ref 0–0.7)
HCT: 38.1 % (ref 35.0–47.0)
Hemoglobin: 12.9 g/dL (ref 12.0–16.0)
LYMPHS ABS: 1.6 10*3/uL (ref 1.0–3.6)
LYMPHS PCT: 26 %
MCH: 31.8 pg (ref 26.0–34.0)
MCHC: 33.8 g/dL (ref 32.0–36.0)
MCV: 94 fL (ref 80.0–100.0)
MONO ABS: 0.6 10*3/uL (ref 0.2–0.9)
MONOS PCT: 10 %
Neutro Abs: 3.8 10*3/uL (ref 1.4–6.5)
Neutrophils Relative %: 61 %
PLATELETS: 344 10*3/uL (ref 150–440)
RBC: 4.05 MIL/uL (ref 3.80–5.20)
RDW: 14.2 % (ref 11.5–14.5)
WBC: 6.3 10*3/uL (ref 3.6–11.0)

## 2016-12-26 LAB — COMPREHENSIVE METABOLIC PANEL
ALT: 25 U/L (ref 14–54)
AST: 31 U/L (ref 15–41)
Albumin: 3.9 g/dL (ref 3.5–5.0)
Alkaline Phosphatase: 75 U/L (ref 38–126)
Anion gap: 8 (ref 5–15)
BUN: 13 mg/dL (ref 6–20)
CALCIUM: 9.4 mg/dL (ref 8.9–10.3)
CHLORIDE: 103 mmol/L (ref 101–111)
CO2: 24 mmol/L (ref 22–32)
CREATININE: 0.6 mg/dL (ref 0.44–1.00)
Glucose, Bld: 96 mg/dL (ref 65–99)
Potassium: 4.5 mmol/L (ref 3.5–5.1)
Sodium: 135 mmol/L (ref 135–145)
Total Bilirubin: 0.6 mg/dL (ref 0.3–1.2)
Total Protein: 7 g/dL (ref 6.5–8.1)

## 2016-12-26 LAB — TROPONIN I

## 2016-12-26 MED ORDER — SODIUM CHLORIDE 0.9 % IV BOLUS (SEPSIS)
1000.0000 mL | Freq: Once | INTRAVENOUS | Status: AC
  Administered 2016-12-26: 1000 mL via INTRAVENOUS

## 2016-12-26 MED ORDER — BENZONATATE 100 MG PO CAPS
200.0000 mg | ORAL_CAPSULE | Freq: Once | ORAL | Status: AC
Start: 2016-12-26 — End: 2016-12-26
  Administered 2016-12-26: 200 mg via ORAL
  Filled 2016-12-26: qty 2

## 2016-12-26 MED ORDER — GUAIFENESIN ER 600 MG PO TB12
600.0000 mg | ORAL_TABLET | Freq: Two times a day (BID) | ORAL | 0 refills | Status: DC | PRN
Start: 2016-12-26 — End: 2017-11-06

## 2016-12-26 MED ORDER — IPRATROPIUM-ALBUTEROL 0.5-2.5 (3) MG/3ML IN SOLN
3.0000 mL | Freq: Once | RESPIRATORY_TRACT | Status: DC
  Filled 2016-12-26: qty 3

## 2016-12-26 NOTE — Clinical Social Work Note (Signed)
CSW asked to see patient due to patient wanting to see CSW. Patient claims she is homeless.    CSW spoke with patient for approximately 45 minutes this morning. Patient is able to state facts but they are compounded by multiple untruths and situations that she has made up in her own mind. Patient was able to tell me all of the places she has been/stayed but she always has a paranoia about them and state that everyone was stealing her money, everyone was being abusive to other residents in the home, she even went to say that she was asked to clean toilets of the patients at [facility]. The list kept growing. CSW attempted to get patient to refocus. She was pleasant with this CSW but when CSW attempted to clarify or offer suggestions, there was always some reason that she did not agree. She asked if there were any assisted living or boarding homes she could go to and when I spoke about this to her, she then stated she was not going anywhere that the staff would manage her medications. She stated that she will demand to manage her own and so she will not go to any place if this is the case. Patient began talking about a trust and was able to look up on her phone the number for [employer]: [phone] ext [phone]. CSW called this number in front of patient and left a message for Dorian. Patient could not tell CSW what this organization was. Ultimately at the end of the conversation, patient stated that she would continue working on places to consider for her to go but that she was going to call a friend to get her to take her back to the hotel she was staying out. She stated she would not return to the homeless shelter. As CSW was leaving, patient expressed appreciation for CSW time and ambulated herself with no assistive device to the bathroom.   Upon leaving patient's room, Dorian from [employer] returned CSW call and explained that they are a company that is going to be  the trustee of patient's inheritance that she received from her father. Dorian stated that they have not yet received the trust fund and are still awaiting it transfer. Dorian stated this is patient's continuous cycle of going to places and always having an issue, thinking someone is out to get her, thinking someone is abusing her or stealing her money. Dorian is stated that patient has been off her psych medications for some time and will not take them. She asked if patient could be involuntarily committed and placed in an inpatient psych hospital in order to stabilize her. CSW has relayed all of the above information to the patient's nurse. CSW signing off at this time.  Shela Leff MSW,LCSW [phone]

## 2016-12-26 NOTE — ED Notes (Signed)
Pt ambulated to restroom without difficulty

## 2016-12-26 NOTE — ED Notes (Addendum)
Dr Reita Cliche, this RN and [employer] officer at bedside. Dr Reita Cliche informed pt that per social work only placement option would be the homeless shelter and that [employer] would try and see if could find ride since cabs are not running. Pt states "I am not going back there, I almost died there, you cannot make me go. I am not homeless I am at the motel and I need to speak to the social worker about my FL2 and out of state options". Dr Reita Cliche attempted to explain options again and pt interrupts her stating "you are trying to make my mind up for me, you cannot do that I want to speak with the social worker. I suggest you just stop talking now".  Dr Reita Cliche informed pt will call social worker again and see what they say.

## 2016-12-26 NOTE — ED Notes (Addendum)
Pt informed per Dr. Reita Cliche social work and PT will come see pt and see if other options are available.  Pt requesting IV out, when this RN received phone call from staff.  Explained to pt will return and take out IV shortly.

## 2016-12-26 NOTE — ED Notes (Signed)
Pt concerned about IV. Assessed site. Patent and fluids running. Taped catheter down more securely to prevent from pulling. Pt reports this feels better. No sx infection or infiltration. Waiting on social work consult. Resting with lights off.

## 2016-12-26 NOTE — ED Notes (Signed)
Report given to Valerie, RN.

## 2016-12-26 NOTE — Progress Notes (Signed)
Gave patient additional prescription she requested.Gave patient cup of water she requested.Walked with patient to lobby.Patients ride was waiting for her. Pt to vehicle without episode.

## 2016-12-26 NOTE — ED Triage Notes (Signed)
Patient to ER from [facility] in [place] via [place] for c/o non-radiating chest pain that began tonight to left chest. Patient describes pain as "it felt like someone was ripping my heart with their fist and my chest got very heavy". +Shortness of breath. Has h/o COPD, also reports having a cold last few days. Denies any diaphoresis or nausea.

## 2016-12-26 NOTE — ED Notes (Signed)
[employer] at nursing desk on phone with pastor. Pt at desk demanding police say certain things on phone.

## 2016-12-26 NOTE — ED Notes (Addendum)
Social work at bedside to talk with pt, will go remove IV once they are done.

## 2016-12-26 NOTE — ED Notes (Signed)
Meal tray given to pt.

## 2016-12-26 NOTE — Discharge Instructions (Signed)
You are evaluated for chest tightness/COPD overnight in the cold, your exam and evaluation are overall reassuring in the emergency department today.  You are evaluated with the social worker in terms of making preparations, and you are on the right track, continue with your current plan.  Return to emerge department immediately for any worsening condition medical illnesses including chest pain or trouble breathing or fevers, or any psychiatric problems including confusion or altered mental status or depression or anxiety or thoughts of wanting to hurt herself or others, hearing voices, or any other symptoms concerning to you.

## 2016-12-26 NOTE — ED Notes (Signed)
This RN returned to room with charge RN. Pt apologized for cursing at [employer].  Pt repeatedly tried to explain requests to this RN and charge.  Ultimately pt would like [employer] escort to [facility] [place] and for them to call pastor to meet them there and for the [employer] to call the pastor and "tell him not to mess with ms Grewell".  [employer] officer working in ED notified of pts requests and at bedside with charge RN.

## 2016-12-26 NOTE — ED Provider Notes (Signed)
[facility] Emergency Department Provider Note   ____________________________________________   First MD Initiated Contact with Patient 12/26/16 [phone]     (approximate)  I have reviewed the triage vital signs and the nursing notes.   HISTORY  Chief Complaint Chest Pain    HPI Angela [name] is a 66 y.o. female brought to the ED via EMS from local motel with a chief complaint of cough, COPD exacerbation with chest tightness.  States cold air makes her cough and wheeze.  Has had cold-like symptoms for the past 2 days.  Denies diaphoresis, nausea/vomiting, palpitations or dizziness.  Denies fever/chills, abdominal pain, dysuria or diarrhea.  She does not like the inhaler that her pulmonologist prescribed her so she does not use it.  States she is supposed to be getting a nebulizer machine at the end of this week.  Denies recent travel or trauma.   Past Medical History:  Diagnosis Date  . Allergic rhinitis   . Anemia   . Blurred vision   . Depression   . Diverticulosis   . Endometriosis   . Falls   . GERD (gastroesophageal reflux disease)   . Hernia, hiatal   . Hypothyroidism   . IBS (irritable bowel syndrome)   . Malignant neoplasm of skin   . Migraine   . Neuropathy   . PTSD (post-traumatic stress disorder)   . Thyroid disease     Patient Active Problem List   Diagnosis Date Noted  . IBS (irritable bowel syndrome) 11/17/2016  . Bilateral carpal tunnel syndrome 11/17/2016  . Plantar fasciitis, bilateral 11/17/2016  . Chronic pain syndrome 11/17/2016  . Chronic hyponatremia 11/17/2016  . PTSD (post-traumatic stress disorder) 11/17/2016  . Hernia, hiatal 11/17/2016  . Vitamin D deficiency 11/17/2016  . Vitamin B12 deficiency 11/17/2016  . SIADH (syndrome of inappropriate ADH production) ([place]) 09/08/2016  . Cervical spondylosis with myelopathy 05/24/2016  . Post-concussion headache 05/24/2016  . MCI (mild cognitive impairment) with memory  loss 03/30/2016  . Gait abnormality 03/30/2016  . Chronic bipolar affective disorder ([place]) 01/24/2016  . Anemia, iron deficiency 06/24/2015  . Endometriosis 06/24/2015  . Essential hypertension 06/24/2015  . History of postmenopausal bleeding 05/22/2015  . History of TIA (transient ischemic attack) 04/20/2015  . Hypothyroidism 04/20/2015  . GERD (gastroesophageal reflux disease) 04/20/2015  . Cervical disc disorder with radiculopathy 03/25/2015  . Chronic neck pain 03/25/2015  . Pelvic pain in female 10/30/2014  . History of skin cancer 06/30/2014  . Chronic headaches 12/16/2013  . Neuropathy 12/16/2013    Past Surgical History:  Procedure Laterality Date  . APPENDECTOMY    . CHOLECYSTECTOMY    . CHOLECYSTECTOMY, LAPAROSCOPIC    . ESOPHAGOGASTRODUODENOSCOPY (EGD) WITH PROPOFOL N/A 03/29/2015   Procedure: ESOPHAGOGASTRODUODENOSCOPY (EGD) WITH PROPOFOL;  Surgeon: Josefine Class, MD;  Location: [facility] ENDOSCOPY;  Service: Endoscopy;  Laterality: N/A;  . HERNIA REPAIR    . laproscopy    . TONSILLECTOMY    . uteral suspension      Prior to Admission medications   Medication Sig Start Date End Date Taking? Authorizing Provider  amLODipine (NORVASC) 5 MG tablet Take 1 tablet (5 mg total) by mouth 2 (two) times daily. 12/13/16   Karamalegos, Devonne Doughty, DO  calcium carbonate (OS-CAL) 600 MG TABS tablet Take 1 tablet (600 mg total) by mouth 2 (two) times daily with a meal. Patient taking differently: Take 600 mg 3 (three) times daily by mouth.  10/28/16   Shary Decamp, PA-C  cycloSPORINE (RESTASIS)  0.05 % ophthalmic emulsion Place 1 drop into both eyes 2 (two) times daily. 12/13/16   Karamalegos, Devonne Doughty, DO  Diclofenac Sodium (SOLARAZE) 3 % GEL Place 1 application 3 (three) times daily onto the skin.     [provider]  ferrous sulfate (FERROUSUL) 325 (65 FE) MG tablet Take 1 tablet (325 mg total) by mouth daily with breakfast. 10/28/16   Shary Decamp, PA-C  gabapentin  (NEURONTIN) 100 MG capsule Take 1-2 capsules (100-200 mg total) by mouth 3 (three) times daily. 11/17/16   Karamalegos, Devonne Doughty, DO  hydrocortisone (PROCTOZONE-HC) 2.5 % rectal cream Place 1 application rectally 2 (two) times daily. 12/13/16   Karamalegos, Devonne Doughty, DO  levothyroxine (SYNTHROID, LEVOTHROID) 100 MCG tablet Take 1 tablet (100 mcg total) by mouth daily before breakfast. 12/13/16   Parks Ranger, Devonne Doughty, DO  linaclotide (LINZESS) 290 MCG CAPS capsule Take 1 capsule (290 mcg total) by mouth daily before breakfast. 12/13/16   Karamalegos, Devonne Doughty, DO  magnesium oxide (MAG-OX) 400 MG tablet Take 400 mg by mouth daily with breakfast.    [provider]  nystatin cream (MYCOSTATIN) Apply 1 application 2 (two) times daily as needed topically for dry skin.     [provider]  pantoprazole (PROTONIX) 40 MG tablet Take 1 tablet (40 mg total) by mouth daily before breakfast. 12/13/16   Karamalegos, Devonne Doughty, DO  Polyethyl Glycol-Propyl Glycol (SYSTANE OP) Place 1 drop into both eyes daily as needed (dry eyes).    [provider]  polyethylene glycol powder (GLYCOLAX/MIRALAX) powder Take 17-34 g by mouth daily. 11/17/16   Karamalegos, Devonne Doughty, DO  Prasterone (INTRAROSA) 6.5 MG INST Place 1 Applicatorful daily vaginally. 12/01/16   Rubie Maid, MD  Pyridoxine HCl (VITAMIN B-6 PO) Take 1 tablet by mouth 2 (two) times daily.    [provider]  ranitidine (ZANTAC) 150 MG tablet Take 150 mg by mouth daily before supper.    [provider]  tiZANidine (ZANAFLEX) 2 MG tablet Take 0.5-1 tablets (1-2 mg total) by mouth 2 (two) times daily as needed for muscle spasms. 12/13/16   Olin Hauser, DO  tolterodine (DETROL LA) 2 MG 24 hr capsule Take 1 capsule (2 mg total) daily by mouth. 12/01/16   Rubie Maid, MD  White Petrolatum-Mineral Oil (GENTEAL TEARS NIGHT-TIME) OINT Place 1 application into both eyes at bedtime.    [provider]    Allergies Bee venom; Darvon [propoxyphene]; Dicyclomine; Other; Oxycodone-acetaminophen; Peanuts [peanut oil]; Penicillins; Sulfa antibiotics; Sulfacetamide sodium; Ultram [tramadol]; Amikacin; Aspirin; Doxycycline; Haloperidol; Hymenoptera venom preparations; Imipramine; Keflex [cephalexin]; Sulfur; Adhesive [tape]; Hydroxyzine; and Prednisone  Family History  Problem Relation Age of Onset  . Heart disease Father   . Cancer Mother        lung  . Urolithiasis Neg Hx   . Kidney disease Neg Hx   . Kidney cancer Neg Hx   . Prostate cancer Neg Hx     Social History Social History   Tobacco Use  . Smoking status: Former Smoker    Last attempt to quit: 11/17/1976    Years since quitting: 40.1  . Smokeless tobacco: Never Used  Substance Use Topics  . Alcohol use: No  . Drug use: No    Review of Systems  Constitutional: No fever/chills. Eyes: No visual changes. ENT: No sore throat. Cardiovascular: Positive for chest tightness. Respiratory: Positive for dry cough, wheezing and shortness of breath. Gastrointestinal: No abdominal pain.  No nausea, no vomiting.  No diarrhea.  No constipation. Genitourinary: Negative for dysuria. Musculoskeletal: Negative for back pain. Skin: Negative for rash. Neurological: Negative for headaches, focal weakness or numbness.   ____________________________________________   PHYSICAL EXAM:  VITAL SIGNS: ED Triage Vitals  Enc Vitals Group     BP 12/26/16 0450 (!) 137/97     Pulse Rate 12/26/16 0450 60     Resp 12/26/16 0450 18     Temp 12/26/16 0450 97.7 F (36.5 C)     Temp Source 12/26/16 0450 Oral     SpO2 12/26/16 0450 100 %     Weight 12/26/16 0451 134 lb (60.8 kg)     Height 12/26/16 0451 5\' 3"  (1.6 m)     Head Circumference --      Peak Flow --      Pain Score 12/26/16 0455 10     Pain Loc --      Pain Edu? --      Excl. in [place]? --     Constitutional: Alert and oriented. Well appearing and in no acute  distress.  Insists on covering her face with the hoodie of her jacket. Eyes: Conjunctivae are normal. PERRL. EOMI. Head: Atraumatic. Nose: No congestion/rhinnorhea. Mouth/Throat: Mucous membranes are moist.  Oropharynx non-erythematous. Neck: No stridor.   Cardiovascular: Normal rate, regular rhythm. Grossly normal heart sounds.  Good peripheral circulation. Respiratory: Normal respiratory effort.  No retractions. Lungs slightly diminished bibasilarly; no wheezing.  Anterior chest tender to palpation and with movement of trunk. Gastrointestinal: Soft and nontender. No distention. No abdominal bruits. No CVA tenderness. Musculoskeletal: No lower extremity tenderness nor edema.  No joint effusions. Neurologic:  Normal speech and language. No gross focal neurologic deficits are appreciated. No gait instability. Skin:  Skin is warm, dry and intact. No rash noted. Psychiatric: Mood and affect are normal. Speech and behavior are normal.  ____________________________________________   LABS (all labs ordered are listed, but only abnormal results are displayed)  Labs Reviewed  CBC WITH DIFFERENTIAL/PLATELET  COMPREHENSIVE METABOLIC PANEL  TROPONIN I   ____________________________________________  EKG  ED ECG REPORT I, Annina Piotrowski J, the attending physician, personally viewed and interpreted this ECG.   Date: 12/26/2016  EKG Time: 0453  Rate: 63  Rhythm: normal EKG, normal sinus rhythm  Axis: Normal  Intervals:none  ST&T Change: Nonspecific  ____________________________________________  RADIOLOGY  Dg Chest 2 View  Result Date: 12/26/2016 CLINICAL DATA:  Acute onset of left-sided chest pain. Shortness of breath. EXAM: CHEST  2 VIEW COMPARISON:  Chest radiograph performed 11/26/2016 FINDINGS: The lungs are well-aerated and clear. There is no evidence of focal opacification, pleural effusion or pneumothorax. The heart is borderline normal in size. No acute osseous abnormalities are  seen. A moderate hiatal hernia is noted. IMPRESSION: 1. No acute cardiopulmonary process seen. 2. Moderate hiatal hernia noted. Electronically Signed   By: Garald Balding M.D.   On: 12/26/2016 05:35    ____________________________________________   PROCEDURES  Procedure(s) performed: None  Procedures  Critical Care performed: No  ____________________________________________   INITIAL IMPRESSION / ASSESSMENT AND PLAN / ED COURSE  As part of my medical decision making, I reviewed the following data within the [place] notes reviewed and incorporated, Labs reviewed, EKG interpreted, Old chart reviewed, Radiograph reviewed and Notes from prior ED visits.   66 year old female with COPD who presents with exacerbation associated with chest tightness.  No history of CAD.  History of chronic pain; recently seen in the ED for concerns for hyponatremia.  Differential includes, but is not limited to, viral syndrome, bronchitis including COPD exacerbation, pneumonia, reactive airway disease including asthma, CHF including exacerbation with or without pulmonary/interstitial edema, pneumothorax, ACS, thoracic trauma, and pulmonary embolism.  Patient is very well-appearing in no acute distress, room air saturations 100% even with her face covered inside her jacket hoodie.  Will obtain screening lab work, chest x-ray.  Patient has a long list of allergies/intolerances which include prednisone and she is unwilling to take it.  Will initiate DuoNeb and Tessalon for cough.  Clinical Course as of Dec 27 646  Tue Dec 26, 2016  0643 Patient refused DuoNeb, citing it causes her to have palpitations.  Difficult to treat her symptoms due to her numerous allergies/medication intolerances.  However, patient seems to be resting very comfortably with room air saturations 100%.  She requested IV fluids because she is convinced she is dehydrated.  Now she is requesting social work consult  because evidently she was kicked out of her motel room and has nowhere to go.  Given that there are current blizzard conditions, will place social work consult for placement assistance.  [JS]    Clinical Course User Index [JS] Paulette Blanch, MD     ____________________________________________   FINAL CLINICAL IMPRESSION(S) / ED DIAGNOSES  Final diagnoses:  Chronic obstructive pulmonary disease, unspecified COPD type [facility])     ED Discharge Orders    None       Note:  This document was prepared using Dragon voice recognition software and may include unintentional dictation errors.    Paulette Blanch, MD 12/26/16 [phone]

## 2016-12-26 NOTE — Progress Notes (Signed)
Pt at desk speaking with [employer] officer, requesting [employer] take her to a church parking lot to get her belongings from a [place].[employer] officer had spoken with pastor of the church on the phone.He had been told there may not be someone at the church to open the [place] for the patient. Patient told [employer] she would have a LYFT meet her at the church and take her to her next destination.[employer] officer told patient of his discussion with the [place].Explained was not comfortable taking patient to parking lot, leaving her there waiting for a ride as she might "freeze". He also stated he had driven past the church earlier, the roads and the parking were still covered in ice.He stated if the roads and the parking lot were not in an icy condition, he would be willing to take her to the church. Patient stated officer was yelling at her(he was not, his tone was conversational, he was speaking with her in a respectful manner).She said 'nevermind, this officer told the pastor all kinds of things', went back to her room. After several minutes, patient came came to the desk and told Mateo Flow she had a friend coming to pick her up. Mateo Flow explained it would be several minutes before she would be able to discharge her. Patient went back to her room

## 2016-12-26 NOTE — ED Provider Notes (Signed)
Signed out that patient was not going to be able to return to the hotel and that she was awaiting a consult with social work.  Nurse initially spoke with a social worker who indicated that homeless shelter was accepting everyone today due to weather.  I spoke with the patient offering this possibility for the homeless shelter and patient became quite irate and was very loud and verbal that she would refuse to go to the homeless shelter because of fears surrounding past experience at home a shelter.  Patient is telling me that she "has an FL 2 "and she wants to "go out of [place] "and wants to speak with a Education officer, museum about "options.  "I did let her know that it is fairly unusual for [place] to actually place from the emergency department.   I spoke directly with [facility] of social work regarding patient's level of sophistication with regard to some possible options, as well as what I found to be some slightly manipulative behaviors here in terms of excessive loud/belligerent discussion.  She is alert and oriented, and does not meet any criteria for involuntary commitment.  Social worker stated we would need to have physical therapy consult in order to make any determinations with regard to placement options and so I have ordered this.     1030.  Nurse gave me verbal report from the social worker who is busy upstairs in the hospital, dictated/written note unavailable yet, however disposition report given to the nurse as well as the patient's understanding is that patient will be discharged from the emergency department today.  Patient does have an underlying psychiatric history that was reviewed by the social worker, and then subsequently I reviewed myself.  Over this past year, I found at least 2 documented stays where she saw Dr. Weber Cooks, psychiatrist after having been placed on involuntary commitment by I think lay folks either at the group home or DSS at one time who is doing an evaluation for  reports that she has a noted chart history of bipolar and possibly even schizophrenia, but Dr. Weber Cooks at times had noted mood disorder with out any clear indicators for inpatient admission or emergency commitment.  Today in finding the same thing, she has strong in eccentric views about where and how she will take help and guidance, however she is lucid alert and oriented, no delusions or hallucinations or acute mania.  She is her own guardian.  Apparently there is a payer source which is in some sort of trust which she is working with the trustee to provide pair source to a longer term placement option, but at this point in time she does not have an acute psychiatric illness for which she needs to stay in the emergency department or have emergency psychiatric evaluation, or acute medical issue in terms of need for nursing home level placement.  She is come trouble after speaking with the social worker about moving forward with plans that are already in place in terms of this trusty and pair source and longer term solutions.  For short-term solution she would like help with transportation to get to the church parking lot where her belongings are in a church van.  She is going to call the pastor to try to get access to her belongings.  She does have a friend that she may be able to stay with, but they are working and unable to pick her up right now.  Nurse will work to help figure out a transportation  plan for her.  She was a little aggressive with me earlier out of her frustration at the option of the homeless shelter, but she was calm and reasonable as we discussed our previous encounters and her own acknowledged eccentriticities, but ability to make her own decisions.     Lisa Roca, MD 12/26/16 [phone]

## 2016-12-26 NOTE — ED Notes (Addendum)
Per Dr. Reita Cliche pt will be discharged but would like someone to contact pastor.  Discussed with pt that this RN would call pastor because she has belongings in the church [place] she would like to get but is worried he will try and commit her.  To this RN understanding and discussed with pt she would like police ride. Asked if ok to ride with pastor if they would give her ride and this RN spoke with him regarding concerns and she agreed.  Called pastor dave on pts phone on speaker phone per her request and verified her belongings are in [place].  Asked if the church would be able to provide ride and before could discuss her concerns about being committed and that she was cleared here at hospital pt states "you fucking blew it" to this RN.  Informed pastor thank you but would call back. Pt continued to be rude and raise voice at RN. RN informed pt will get someone else to help her and excused self from room. Agricultural consultant notified.

## 2016-12-28 ENCOUNTER — Ambulatory Visit: Payer: Medicare Other | Admitting: Occupational Therapy

## 2016-12-28 ENCOUNTER — Telehealth: Payer: Self-pay | Admitting: Obstetrics and Gynecology

## 2016-12-28 DIAGNOSIS — D508 Other iron deficiency anemias: Secondary | ICD-10-CM

## 2016-12-28 NOTE — Telephone Encounter (Signed)
The patient called and stated that she would like to speak with Dr. Andreas Blower nurse. No other information was disclosed. Please advise.

## 2016-12-29 ENCOUNTER — Encounter: Payer: Self-pay | Admitting: Obstetrics and Gynecology

## 2016-12-29 ENCOUNTER — Telehealth: Payer: Self-pay | Admitting: Obstetrics and Gynecology

## 2016-12-29 MED ORDER — ESTROGENS, CONJUGATED 0.625 MG/GM VA CREA: TOPICAL_CREAM | Freq: Every day | VAGINAL | 11 refills | Status: DC

## 2016-12-29 NOTE — Telephone Encounter (Signed)
Patient called stating she did not like the samples of Enterosa and would like to go back to using premarin and would [facility] [address]. She also needs a refill on ferrous sulfate. She is requesting a 90 day supply be sent in on both  because her meds are free right now until January. Thanks

## 2016-12-29 NOTE — Telephone Encounter (Signed)
lmtrc

## 2016-12-29 NOTE — Telephone Encounter (Signed)
Patient called into the office and refills of her premarin cream was sent in to her pharmacy of choice. Spoke with Dr. Marcelline Mates and she states per pt's last lab she does not need the iron pill (which is filled by her other provider). Pt got upset and states she does need it and proceeded to hang up on me

## 2017-01-01 NOTE — Telephone Encounter (Signed)
lmtrc

## 2017-01-02 ENCOUNTER — Ambulatory Visit (INDEPENDENT_AMBULATORY_CARE_PROVIDER_SITE_OTHER): Payer: Medicare Other | Admitting: Podiatry

## 2017-01-02 ENCOUNTER — Encounter: Payer: Self-pay | Admitting: Podiatry

## 2017-01-02 ENCOUNTER — Telehealth: Payer: Self-pay | Admitting: Podiatry

## 2017-01-02 DIAGNOSIS — M2042 Other hammer toe(s) (acquired), left foot: Secondary | ICD-10-CM

## 2017-01-02 DIAGNOSIS — M2041 Other hammer toe(s) (acquired), right foot: Secondary | ICD-10-CM | POA: Diagnosis not present

## 2017-01-02 DIAGNOSIS — M722 Plantar fascial fibromatosis: Secondary | ICD-10-CM | POA: Diagnosis not present

## 2017-01-02 MED ORDER — DICLOFENAC SODIUM 3 % TD GEL: 1.0000 "application " | Freq: Three times a day (TID) | TRANSDERMAL | 30 refills | Status: DC

## 2017-01-02 NOTE — Telephone Encounter (Signed)
Patient states that she needs a refill on her diclofenac sodium 3% gel that she uses on her wrists, knees, feet, legs for pain. She said that Dr. Amalia Hailey is aware of her need for this and that this drug will require prior approval. She wants 2 tubes at a time because she puts the gel everywhere that she needs pain relief.

## 2017-01-04 MED ORDER — FERROUS SULFATE 325 (65 FE) MG PO TABS: 325.0000 mg | ORAL_TABLET | Freq: Every day | ORAL | 1 refills | Status: DC

## 2017-01-04 MED ORDER — ESTROGENS, CONJUGATED 0.625 MG/GM VA CREA: 0.2500 | TOPICAL_CREAM | VAGINAL | 1 refills | Status: DC

## 2017-01-04 NOTE — Telephone Encounter (Signed)
    12/27- pt is aware of AC recommendations. She states her h/h is only normal d/t her taking iron daily. She prefers to continue.     Pt called back and wanted 90 day supply of both iron and premarin. She was unable to use intrarosa. [facility].  She would like to ask [facility] if it is appropriate for her to use a diva cup? Pt aware AC will get this message after Christmas.     [place].  pt did call the office yesterday but was unable to hold while [place] got me to the phone.

## 2017-01-04 NOTE — Progress Notes (Signed)
   HPI: 66 year old female presents to the office today for follow up evaluation of bilateral plantar fasciitis. She reports continued stabbing pain to the bilateral heels. She also reports a sore lesion to the right great toe. Walking makes the pain worse. She denies alleviating factors. She has been taking Gabapentin, Tylenol and Ibuprofen with no significant relief. Patient is here for further evaluation and treatment.   Past Medical History:  Diagnosis Date  . Allergic rhinitis   . Anemia   . Blurred vision   . Depression   . Diverticulosis   . Endometriosis   . Falls   . GERD (gastroesophageal reflux disease)   . Hernia, hiatal   . Hypothyroidism   . IBS (irritable bowel syndrome)   . Malignant neoplasm of skin   . Migraine   . Neuropathy   . PTSD (post-traumatic stress disorder)   . Thyroid disease       Physical Exam: General: The patient is alert and oriented x3 in no acute distress.  Dermatology: Hyperkeratotic dystrophic nail noted to the right great toenail. Skin is warm, dry and supple bilateral lower extremities. Negative for open lesions or macerations.  Vascular: Palpable pedal pulses bilaterally. No edema or erythema noted. Capillary refill within normal limits.  Neurological: Epicritic and protective threshold grossly intact bilaterally.   Musculoskeletal Exam: Hammertoe contracture deformity digits 2-5 of the bilateral feet noted. Range of motion within normal limits to all pedal and ankle joints bilateral. Muscle strength 5/5 in all groups bilateral. There is some pain on palpation to the medial calcaneal tubercle of the bilateral heels consistent with plantar fasciitis.  Assessment: 1. Plantar fasciitis bilaterally 2. Hammertoes 2-5 bilaterally  Plan of Care:  1. Patient was evaluated. 2. Injection of 0.5 mL Celestone Soluspan injected in the patient's bilateral plantar fascial heels.  3. New plantar fascial braces dispensed bilaterally. 4. Continue  wearing New Balance shoes. 5. Padding dispensed for hammertoes.  6. Return to clinic when necessary.   Edrick Kins, DPM [facility]  Dr. Edrick Kins, DPM    [address], [place] [address]                Office [phone]  Fax [phone]

## 2017-01-08 ENCOUNTER — Other Ambulatory Visit: Payer: Self-pay | Admitting: Obstetrics and Gynecology

## 2017-01-08 MED ORDER — ESTROGENS, CONJUGATED 0.625 MG/GM VA CREA: TOPICAL_CREAM | VAGINAL | 4 refills | Status: DC

## 2017-01-08 NOTE — Telephone Encounter (Signed)
Please inform Ms. Banfield that we have called in her prescription for her Premarin cream.  Each tube should last her approximately 3-6 months, depending on how often she uses it.    With regards to her iron pills, as Jasmine previously informed her, her iron levels are actually good based on recent labs and does not require iron tablets at this time (her H/H was 12.9/38.1).  If she is adamant about getting her iron she can get a generic tablet over the counter and take it daily.  I would not want her to take too much iron as she could cause herself to become iron overloaded, which can lead to medical problems such as (chronic fatigue, joint pain, abdominal pain, liver disease, and irregular heart beat).    Dr. Marcelline Mates

## 2017-01-10 ENCOUNTER — Telehealth: Payer: Self-pay | Admitting: Family Medicine

## 2017-01-10 NOTE — Telephone Encounter (Signed)
Rausa with [facility] said the [facility] form for pt was now out of date, it has to be within 30 days of move in..  She said she thought the pt was qualified for assisted nursing instead of skilled nursing and asked if it could be written in instead.  Her call back number is [phone] and fax number [phone]

## 2017-01-11 ENCOUNTER — Ambulatory Visit: Payer: Self-pay | Admitting: Family Medicine

## 2017-01-11 NOTE — Telephone Encounter (Signed)
Spoke with Rausa and faxed new FL2 form.

## 2017-01-12 ENCOUNTER — Other Ambulatory Visit: Payer: Self-pay

## 2017-01-12 DIAGNOSIS — M5010 Cervical disc disorder with radiculopathy, unspecified cervical region: Secondary | ICD-10-CM

## 2017-01-12 DIAGNOSIS — G5603 Carpal tunnel syndrome, bilateral upper limbs: Secondary | ICD-10-CM

## 2017-01-12 DIAGNOSIS — G894 Chronic pain syndrome: Secondary | ICD-10-CM

## 2017-01-12 MED ORDER — CYCLOSPORINE 0.05 % OP EMUL: 1.0000 [drp] | Freq: Two times a day (BID) | OPHTHALMIC | 5 refills | Status: DC

## 2017-01-12 MED ORDER — HYDROCORTISONE 2.5 % RE CREA: 1.0000 "application " | TOPICAL_CREAM | Freq: Two times a day (BID) | RECTAL | 1 refills | Status: DC

## 2017-01-12 MED ORDER — GABAPENTIN 100 MG PO CAPS: 100.0000 mg | ORAL_CAPSULE | Freq: Three times a day (TID) | ORAL | 1 refills | Status: DC

## 2017-01-17 ENCOUNTER — Encounter: Payer: Self-pay | Admitting: Obstetrics and Gynecology

## 2017-01-18 ENCOUNTER — Other Ambulatory Visit: Payer: Self-pay

## 2017-01-18 ENCOUNTER — Emergency Department
Admission: EM | Admit: 2017-01-18 | Discharge: 2017-01-18 | Disposition: A | Discharge status: Home / Self Care | Payer: Medicare Other | Attending: Emergency Medicine | Admitting: Emergency Medicine

## 2017-01-18 ENCOUNTER — Telehealth: Payer: Self-pay | Admitting: Nurse Practitioner

## 2017-01-18 DIAGNOSIS — Z79899 Other long term (current) drug therapy: Secondary | ICD-10-CM | POA: Insufficient documentation

## 2017-01-18 DIAGNOSIS — E860 Dehydration: Secondary | ICD-10-CM | POA: Diagnosis not present

## 2017-01-18 DIAGNOSIS — G8929 Other chronic pain: Secondary | ICD-10-CM | POA: Diagnosis not present

## 2017-01-18 DIAGNOSIS — Z87891 Personal history of nicotine dependence: Secondary | ICD-10-CM | POA: Diagnosis not present

## 2017-01-18 DIAGNOSIS — F4321 Adjustment disorder with depressed mood: Secondary | ICD-10-CM | POA: Insufficient documentation

## 2017-01-18 DIAGNOSIS — Z8673 Personal history of transient ischemic attack (TIA), and cerebral infarction without residual deficits: Secondary | ICD-10-CM | POA: Insufficient documentation

## 2017-01-18 DIAGNOSIS — N950 Postmenopausal bleeding: Secondary | ICD-10-CM | POA: Insufficient documentation

## 2017-01-18 DIAGNOSIS — Z765 Malingerer [conscious simulation]: Secondary | ICD-10-CM | POA: Insufficient documentation

## 2017-01-18 DIAGNOSIS — Z91010 Allergy to peanuts: Secondary | ICD-10-CM | POA: Diagnosis not present

## 2017-01-18 DIAGNOSIS — E039 Hypothyroidism, unspecified: Secondary | ICD-10-CM | POA: Diagnosis not present

## 2017-01-18 DIAGNOSIS — Z008 Encounter for other general examination: Secondary | ICD-10-CM | POA: Diagnosis present

## 2017-01-18 DIAGNOSIS — Z590 Homelessness: Secondary | ICD-10-CM | POA: Diagnosis not present

## 2017-01-18 DIAGNOSIS — G43909 Migraine, unspecified, not intractable, without status migrainosus: Secondary | ICD-10-CM | POA: Insufficient documentation

## 2017-01-18 LAB — COMPREHENSIVE METABOLIC PANEL
ALK PHOS: 81 U/L (ref 38–126)
ALT: 29 U/L (ref 14–54)
AST: 34 U/L (ref 15–41)
Albumin: 4.3 g/dL (ref 3.5–5.0)
Anion gap: 9 (ref 5–15)
BUN: 13 mg/dL (ref 6–20)
CALCIUM: 9.1 mg/dL (ref 8.9–10.3)
CO2: 23 mmol/L (ref 22–32)
CREATININE: 0.64 mg/dL (ref 0.44–1.00)
Chloride: 99 mmol/L — ABNORMAL LOW (ref 101–111)
GFR calc non Af Amer: >60 mL/min (ref >60)
GLUCOSE: 99 mg/dL (ref 65–99)
Potassium: 4 mmol/L (ref 3.5–5.1)
SODIUM: 131 mmol/L — AB (ref 135–145)
Total Bilirubin: 0.4 mg/dL (ref 0.3–1.2)
Total Protein: 7 g/dL (ref 6.5–8.1)

## 2017-01-18 LAB — CBC
HCT: 37.6 % (ref 35.0–47.0)
HEMOGLOBIN: 13 g/dL (ref 12.0–16.0)
MCH: 32 pg (ref 26.0–34.0)
MCHC: 34.6 g/dL (ref 32.0–36.0)
MCV: 92.4 fL (ref 80.0–100.0)
Platelets: 351 10*3/uL (ref 150–440)
RBC: 4.07 MIL/uL (ref 3.80–5.20)
RDW: 13.5 % (ref 11.5–14.5)
WBC: 7.2 10*3/uL (ref 3.6–11.0)

## 2017-01-18 MED ORDER — SODIUM CHLORIDE 0.9 % IV BOLUS (SEPSIS)
1000.0000 mL | Freq: Once | INTRAVENOUS | Status: AC
  Administered 2017-01-18: 1000 mL via INTRAVENOUS

## 2017-01-18 MED ORDER — BACITRACIN ZINC 500 UNIT/GM EX OINT
TOPICAL_OINTMENT | Freq: Once | CUTANEOUS | Status: AC
  Administered 2017-01-18: 1 via TOPICAL
  Filled 2017-01-18: qty 0.9

## 2017-01-18 NOTE — Telephone Encounter (Signed)
Left message for patient to call back  

## 2017-01-18 NOTE — Telephone Encounter (Signed)
Rausa asked if pt had record of TB test on file [phone]

## 2017-01-18 NOTE — ED Notes (Signed)
Pt right great toe cleaned with betadine and bacitracin applied per pt request to healed blister

## 2017-01-18 NOTE — ED Notes (Signed)
Pt states "yes the fluids helped, God said we are the salt of the Earth and now that I have had my sodium I feel better, I was able to relax."

## 2017-01-18 NOTE — Telephone Encounter (Signed)
Dates were given when patient had TB test done from outside record.

## 2017-01-18 NOTE — ED Notes (Signed)
Pt. Verbalizes understanding of d/c instructions and follow-up. VS stable. Pt. In NAD at time of d/c and denies further concerns regarding this visit. Pt. Stable at the time of departure from the unit, departing unit by the safest and most appropriate manner per that pt condition and limitations with all belongings accounted for. Pt advised to return to the ED at any time for emergent concerns, or for new/worsening symptoms.   

## 2017-01-18 NOTE — ED Triage Notes (Signed)
Pt states she lives in [place] and has not had access to gatorade and is supposed to drink gatorade with every meal. States she is concerned about electrolyte imbalance due to the lack of gatorade in her diet. Pt co chronic pain all over and being "irritable".

## 2017-01-18 NOTE — Discharge Instructions (Signed)
Please make sure you remain well-hydrated and follow-up with your primary care physician as needed.  Return to the emergency department for any concerns.  It was a pleasure to take care of you today, and thank you for coming to our emergency department.  If you have any questions or concerns before leaving please ask the nurse to grab me and I'm more than happy to go through your aftercare instructions again.  If you were prescribed any opioid pain medication today such as Norco, Vicodin, Percocet, morphine, hydrocodone, or oxycodone please make sure you do not drive when you are taking this medication as it can alter your ability to drive safely.  If you have any concerns once you are home that you are not improving or are in fact getting worse before you can make it to your follow-up appointment, please do not hesitate to call 911 and come back for further evaluation.  Darel Hong, MD  Results for orders placed or performed during the hospital encounter of 01/18/17  CBC  Result Value Ref Range   WBC 7.2 3.6 - 11.0 K/uL   RBC 4.07 3.80 - 5.20 MIL/uL   Hemoglobin 13.0 12.0 - 16.0 g/dL   HCT 37.6 35.0 - 47.0 %   MCV 92.4 80.0 - 100.0 fL   MCH 32.0 26.0 - 34.0 pg   MCHC 34.6 32.0 - 36.0 g/dL   RDW 13.5 11.5 - 14.5 %   Platelets 351 150 - 440 K/uL  Comprehensive metabolic panel  Result Value Ref Range   Sodium 131 (L) 135 - 145 mmol/L   Potassium 4.0 3.5 - 5.1 mmol/L   Chloride 99 (L) 101 - 111 mmol/L   CO2 23 22 - 32 mmol/L   Glucose, Bld 99 65 - 99 mg/dL   BUN 13 6 - 20 mg/dL   Creatinine, Ser 0.64 0.44 - 1.00 mg/dL   Calcium 9.1 8.9 - 10.3 mg/dL   Total Protein 7.0 6.5 - 8.1 g/dL   Albumin 4.3 3.5 - 5.0 g/dL   AST 34 15 - 41 U/L   ALT 29 14 - 54 U/L   Alkaline Phosphatase 81 38 - 126 U/L   Total Bilirubin 0.4 0.3 - 1.2 mg/dL   GFR calc non Af Amer >60 >60 mL/min   GFR calc Af Amer >60 >60 mL/min   Anion gap 9 5 - 15   Dg Chest 2 View  Result Date: 12/26/2016 CLINICAL  DATA:  Acute onset of left-sided chest pain. Shortness of breath. EXAM: CHEST  2 VIEW COMPARISON:  Chest radiograph performed 11/26/2016 FINDINGS: The lungs are well-aerated and clear. There is no evidence of focal opacification, pleural effusion or pneumothorax. The heart is borderline normal in size. No acute osseous abnormalities are seen. A moderate hiatal hernia is noted. IMPRESSION: 1. No acute cardiopulmonary process seen. 2. Moderate hiatal hernia noted. Electronically Signed   By: Garald Balding M.D.   On: 12/26/2016 05:35

## 2017-01-18 NOTE — ED Notes (Signed)
Pt given gingerale and crackers 

## 2017-01-18 NOTE — ED Notes (Signed)
esig pad did not transfer to screen.

## 2017-01-18 NOTE — ED Notes (Signed)
Pt requesting ativan and MD to "look at my sprained ankle. It's been this way since October." No swelling, discoloration or abnormality noted. MD aware.

## 2017-01-18 NOTE — ED Provider Notes (Signed)
[facility] Emergency Department Provider Note  ____________________________________________   First MD Initiated Contact with Patient 01/18/17 0404     (approximate)  I have reviewed the triage vital signs and the nursing notes.   HISTORY  Chief Complaint Abnormal Lab    HPI Angela [name] is a 67 y.o. female who comes the emergency department via EMS requesting her "electrolytes be checked".  She is homeless and currently living in a hotel.  She says is secondary to the government shutdown she has not had her Social Security check and she will soon be evicted.  She says that she normally drinks several Gatorade today and she has been unable to afford it.  She also reports a sprained ankle since October and she is concerned that she might have an infection to her right great toe.  Her symptoms have been insidious onset and constant.  Nothing seems to make it better or worse.  Past Medical History:  Diagnosis Date  . Allergic rhinitis   . Anemia   . Blurred vision   . Depression   . Diverticulosis   . Endometriosis   . Falls   . GERD (gastroesophageal reflux disease)   . Hernia, hiatal   . Hypothyroidism   . IBS (irritable bowel syndrome)   . Malignant neoplasm of skin   . Migraine   . Neuropathy   . PTSD (post-traumatic stress disorder)   . Thyroid disease     Patient Active Problem List   Diagnosis Date Noted  . IBS (irritable bowel syndrome) 11/17/2016  . Bilateral carpal tunnel syndrome 11/17/2016  . Plantar fasciitis, bilateral 11/17/2016  . Chronic pain syndrome 11/17/2016  . Chronic hyponatremia 11/17/2016  . PTSD (post-traumatic stress disorder) 11/17/2016  . Hernia, hiatal 11/17/2016  . Vitamin D deficiency 11/17/2016  . Vitamin B12 deficiency 11/17/2016  . SIADH (syndrome of inappropriate ADH production) ([place]) 09/08/2016  . Cervical spondylosis with myelopathy 05/24/2016  . Post-concussion headache 05/24/2016  . MCI (mild  cognitive impairment) with memory loss 03/30/2016  . Gait abnormality 03/30/2016  . Chronic bipolar affective disorder ([place]) 01/24/2016  . Anemia, iron deficiency 06/24/2015  . Endometriosis 06/24/2015  . Essential hypertension 06/24/2015  . History of postmenopausal bleeding 05/22/2015  . History of TIA (transient ischemic attack) 04/20/2015  . Hypothyroidism 04/20/2015  . GERD (gastroesophageal reflux disease) 04/20/2015  . Cervical disc disorder with radiculopathy 03/25/2015  . Chronic neck pain 03/25/2015  . Pelvic pain in female 10/30/2014  . History of skin cancer 06/30/2014  . Chronic headaches 12/16/2013  . Neuropathy 12/16/2013    Past Surgical History:  Procedure Laterality Date  . APPENDECTOMY    . CHOLECYSTECTOMY    . CHOLECYSTECTOMY, LAPAROSCOPIC    . ESOPHAGOGASTRODUODENOSCOPY (EGD) WITH PROPOFOL N/A 03/29/2015   Procedure: ESOPHAGOGASTRODUODENOSCOPY (EGD) WITH PROPOFOL;  Surgeon: Josefine Class, MD;  Location: [facility] ENDOSCOPY;  Service: Endoscopy;  Laterality: N/A;  . HERNIA REPAIR    . laproscopy    . TONSILLECTOMY    . uteral suspension      Prior to Admission medications   Medication Sig Start Date End Date Taking? Authorizing Provider  amLODipine (NORVASC) 5 MG tablet Take 1 tablet (5 mg total) by mouth 2 (two) times daily. 12/13/16   Karamalegos, Alexander J, DO  ANORO ELLIPTA 62.5-25 MCG/INH AEPB Inhale 1 puff into the lungs daily. 11/23/16   [provider]  calcium carbonate (OS-CAL) 600 MG TABS tablet Take 1 tablet (600 mg total) by mouth 2 (two)  times daily with a meal. Patient taking differently: Take 600 mg 3 (three) times daily by mouth.  10/28/16   Shary Decamp, PA-C  conjugated estrogens (PREMARIN) vaginal cream Place vaginally 3 (three) times a week. Use 1/2 gram 3-4 times weekly. 01/08/17   Rubie Maid, MD  cycloSPORINE (RESTASIS) 0.05 % ophthalmic emulsion Place 1 drop into both eyes 2 (two) times daily. 01/12/17   Karamalegos,  Devonne Doughty, DO  Diclofenac Sodium (SOLARAZE) 3 % GEL Place 1 application onto the skin 3 (three) times daily. 01/02/17   Edrick Kins, DPM  ferrous sulfate (FERROUSUL) 325 (65 FE) MG tablet Take 1 tablet (325 mg total) by mouth daily with breakfast. 01/04/17   Defrancesco, Alanda Slim, MD  gabapentin (NEURONTIN) 100 MG capsule Take 1-2 capsules (100-200 mg total) by mouth 3 (three) times daily. 01/12/17   Karamalegos, Devonne Doughty, DO  guaiFENesin (MUCINEX) 600 MG 12 hr tablet Take 1 tablet (600 mg total) by mouth 2 (two) times daily as needed for cough. 12/26/16 12/26/17  Lisa Roca, MD  hydrocortisone (PROCTOZONE-HC) 2.5 % rectal cream Place 1 application rectally 2 (two) times daily. 01/12/17   Olin Hauser, DO  levothyroxine (SYNTHROID, LEVOTHROID) 100 MCG tablet Take 1 tablet (100 mcg total) by mouth daily before breakfast. 12/13/16   Parks Ranger, Devonne Doughty, DO  linaclotide (LINZESS) 290 MCG CAPS capsule Take 1 capsule (290 mcg total) by mouth daily before breakfast. 12/13/16   Karamalegos, Devonne Doughty, DO  magnesium oxide (MAG-OX) 400 MG tablet Take 400 mg by mouth daily with breakfast.    [provider]  pantoprazole (PROTONIX) 40 MG tablet Take 1 tablet (40 mg total) by mouth daily before breakfast. Patient not taking: Reported on 12/26/2016 12/13/16   Olin Hauser, DO  Polyethyl Glycol-Propyl Glycol (SYSTANE OP) Place 1 drop into both eyes daily as needed (dry eyes).    [provider]  polyethylene glycol powder (GLYCOLAX/MIRALAX) powder Take 17-34 g by mouth daily. 11/17/16   Karamalegos, Devonne Doughty, DO  Pyridoxine HCl (VITAMIN B-6) 500 MG tablet Take 1 tablet by mouth 2 (two) times daily.    [provider]  ranitidine (ZANTAC) 150 MG tablet Take 150 mg by mouth daily before supper.    [provider]  tiZANidine (ZANAFLEX) 2 MG tablet Take 0.5-1 tablets (1-2 mg total) by mouth 2 (two) times daily as needed for muscle spasms.  12/13/16   Karamalegos, Devonne Doughty, DO  tolterodine (DETROL LA) 2 MG 24 hr capsule Take 1 capsule (2 mg total) daily by mouth. Patient not taking: Reported on 12/26/2016 12/01/16   Rubie Maid, MD  traZODone (DESYREL) 50 MG tablet Take 25-50 tablets by mouth daily.  11/09/16   [provider]  White Petrolatum-Mineral Oil (GENTEAL TEARS NIGHT-TIME) OINT Place 1 application into both eyes at bedtime.    [provider]    Allergies Bee venom; Darvon [propoxyphene]; Dicyclomine; Other; Oxycodone-acetaminophen; Peanuts [peanut oil]; Penicillins; Sulfa antibiotics; Sulfacetamide sodium; Ultram [tramadol]; Amikacin; Aspirin; Doxycycline; Haloperidol; Hymenoptera venom preparations; Imipramine; Keflex [cephalexin]; Sulfur; Adhesive [tape]; Hydroxyzine; and Prednisone  Family History  Problem Relation Age of Onset  . Heart disease Father   . Cancer Mother        lung  . Urolithiasis Neg Hx   . Kidney disease Neg Hx   . Kidney cancer Neg Hx   . Prostate cancer Neg Hx     Social History Social History   Tobacco Use  . Smoking status: Former Smoker  Last attempt to quit: 11/17/1976    Years since quitting: 40.1  . Smokeless tobacco: Never Used  Substance Use Topics  . Alcohol use: No  . Drug use: No    Review of Systems Constitutional: No fever/chills ENT: No sore throat. Cardiovascular: Denies chest pain. Respiratory: Denies shortness of breath. Gastrointestinal: No abdominal pain.  No nausea, no vomiting.  No diarrhea.  No constipation. Musculoskeletal: Negative for back pain. Neurological: Negative for headaches   ____________________________________________   PHYSICAL EXAM:  VITAL SIGNS: ED Triage Vitals  Enc Vitals Group     BP 01/18/17 0207 (!) 148/89     Pulse Rate 01/18/17 0207 65     Resp 01/18/17 0207 18     Temp 01/18/17 0207 98.7 F (37.1 C)     Temp Source 01/18/17 0207 Oral     SpO2 01/18/17 0207 98 %     Weight 01/18/17 0208 130 lb  (59 kg)     Height 01/18/17 0208 5\' 3"  (1.6 m)     Head Circumference --      Peak Flow --      Pain Score 01/18/17 0207 9     Pain Loc --      Pain Edu? --      Excl. in [place]? --     Constitutional: Alert and oriented x4 somewhat anxious appearing pacing around the room no diaphoresis Head: Atraumatic. Nose: No congestion/rhinnorhea. Mouth/Throat: No trismus Neck: No stridor.   Cardiovascular: Regular rate and rhythm Respiratory: Normal respiratory effort.  No retractions. MSK: No tenderness over medial malleolus or lateral malleolus or for 6 cm proximal No tenderness over navicular, midfoot, or fifth metatarsal 2+ dorsalis pedis pulse Unroofed blister on the medial aspect of her right great toe Compartments soft Patient can fire extensor hallucis longus, extensor digitorum longus, flexor hallucis longus, flexor digitorum longus, tibialis anterior, and gastrocnemius Sensation intact to light touch to sural, saphenous, deep peroneal, superficial peroneal, and tibial nerve  Neurologic:  Normal speech and language. No gross focal neurologic deficits are appreciated.  Skin:  Skin is warm, dry and intact. No rash noted.    ____________________________________________  LABS (all labs ordered are listed, but only abnormal results are displayed)  Labs Reviewed  COMPREHENSIVE METABOLIC PANEL - Abnormal; Notable for the following components:      Result Value   Sodium 131 (*)    Chloride 99 (*)    All other components within normal limits  CBC    Lab work reviewed by me with slight hypochloremic hyponatremia __________________________________________  EKG   ____________________________________________  RADIOLOGY   ____________________________________________   DIFFERENTIAL includes but not limited to  With dehydration, hyponatremia, malingering   PROCEDURES  Procedure(s) performed: no  Procedures  Critical Care performed: no  Observation:  no ____________________________________________   INITIAL IMPRESSION / ASSESSMENT AND PLAN / ED COURSE  Pertinent labs & imaging results that were available during my care of the patient were reviewed by me and considered in my medical decision making (see chart for details).  When I walked into the patient's room to notify her that her electrolytes were largely normal, she seemed disappointed.  When I asked her why she was disappointed she said "I was hoping that I could be admitted for a couple days."  The patient is clearly malingering.  She does have slight hypochloremic hyponatremia so I do think is reasonable to give her a small bolus of IV fluids.  Her right great toe has an unroofed blister  and does not appear infected.  Bacitracin given with improvement in symptoms.  She will be discharged home with primary care follow-up verbalized understanding and agreement with plan.      ____________________________________________   FINAL CLINICAL IMPRESSION(S) / ED DIAGNOSES  Final diagnoses:  Malingering  Mild dehydration      NEW MEDICATIONS STARTED DURING THIS VISIT:  This SmartLink is deprecated. Use AVSMEDLIST instead to display the medication list for a patient.   Note:  This document was prepared using Dragon voice recognition software and may include unintentional dictation errors.      Darel Hong, MD 01/18/17 [phone]

## 2017-01-18 NOTE — ED Notes (Signed)
Pt up to toilet in tx room without complication

## 2017-01-26 ENCOUNTER — Telehealth: Payer: Self-pay | Admitting: Internal Medicine

## 2017-01-26 NOTE — Telephone Encounter (Signed)
[place] out [place]

## 2017-01-26 NOTE — Telephone Encounter (Deleted)
Copied from [place] [id number]. Topic: Quick Communication - See Telephone Encounter >> Jan 26, 2017 11:13 AM Cleaster Corin, NT wrote: CRM for notification. See Telephone encounter for:   01/26/17. Called pt. 3x times to let her know about re-scheduled appt. Left vm for pt. To give me a call back. Also put pt. On wait list just in case something earlier comes open. Scheduled appt. For 03-05-17 at 1pm

## 2017-01-26 NOTE — Telephone Encounter (Signed)
Copied from [place] [id number]. Topic: Quick Communication - See Telephone Encounter >> Jan 26, 2017 11:13 AM Cleaster Corin, NT wrote: CRM for notification. See Telephone encounter for:   01/26/17. Called pt. 3x times to let her know about re-scheduled appt. Left vm for pt. To give me a call back. Also put pt. On wait list just in case something earlier comes open. Scheduled appt. For 03-05-17 at 1pm

## 2017-02-01 ENCOUNTER — Ambulatory Visit: Payer: Self-pay | Admitting: Internal Medicine

## 2017-02-12 ENCOUNTER — Ambulatory Visit: Payer: Medicare Other | Admitting: Podiatry

## 2017-02-13 ENCOUNTER — Ambulatory Visit: Payer: Medicare Other | Admitting: Podiatry

## 2017-02-19 ENCOUNTER — Ambulatory Visit: Payer: Self-pay | Admitting: Family Medicine

## 2017-03-05 ENCOUNTER — Ambulatory Visit: Payer: Self-pay | Admitting: Internal Medicine

## 2017-04-03 DIAGNOSIS — H9011 Conductive hearing loss, unilateral, right ear, with unrestricted hearing on the contralateral side: Secondary | ICD-10-CM | POA: Insufficient documentation

## 2017-04-23 ENCOUNTER — Telehealth: Payer: Self-pay | Admitting: Gastroenterology

## 2017-04-23 NOTE — Telephone Encounter (Signed)
Pt states she was at  [facility] for the last 24 hours she needs to speak to  Dr. Allen Norris  ASAP [phone] she  Makes this out be very urgent

## 2017-04-23 NOTE — Telephone Encounter (Signed)
Pt would like a referral to a GI doctor in [place]. Pt would like to know who you recommend?

## 2017-04-30 ENCOUNTER — Telehealth: Payer: Self-pay | Admitting: *Deleted

## 2017-04-30 DIAGNOSIS — D508 Other iron deficiency anemias: Secondary | ICD-10-CM

## 2017-04-30 NOTE — Telephone Encounter (Signed)
Patient called and states she needs a refill of her iron pills and her premarin cream. Patient pharmacy is [facility]  on [address] in [place] . Patient is stating she is needing this refill until she finds a new GYN she also states if she know a Advertising copywriter in [place] that she can refer her to that would be great as well. Patient states if you have any questions you can call her. Pt # is [phone]. Please advise. Thank you

## 2017-05-01 MED ORDER — ESTROGENS, CONJUGATED 0.625 MG/GM VA CREA: TOPICAL_CREAM | VAGINAL | 4 refills | Status: DC

## 2017-05-01 MED ORDER — FERROUS SULFATE 325 (65 FE) MG PO TABS: 325.0000 mg | ORAL_TABLET | Freq: Every day | ORAL | 1 refills | Status: DC

## 2017-05-01 NOTE — Telephone Encounter (Signed)
Pt called no answer busy signal unable to leave message.

## 2017-05-01 NOTE — Addendum Note (Signed)
Addended by: Edwyna Shell on: 05/01/2017 02:27 PM   Modules accepted: Orders

## 2017-05-08 DIAGNOSIS — F4321 Adjustment disorder with depressed mood: Secondary | ICD-10-CM | POA: Insufficient documentation

## 2017-05-08 DIAGNOSIS — B351 Tinea unguium: Secondary | ICD-10-CM | POA: Insufficient documentation

## 2017-05-25 ENCOUNTER — Telehealth: Payer: Self-pay

## 2017-05-25 NOTE — Telephone Encounter (Signed)
Patient called yesterday at 4:45.  She has not found a PCP in [place] yet.  She is requesting for Dr. Raliegh Ip to gice her 1 refill on all of her medications.  She would like them sent to [facility].  She is going to be in town today and would like it done today.   She would like a call back ASAP if this is going to done.

## 2017-05-25 NOTE — Telephone Encounter (Signed)
Patient will be in [facility] for her Dermatology appointment via public transporation so wants all med's refill send to [place] at [facility] [place].

## 2017-05-25 NOTE — Telephone Encounter (Signed)
Please notify patient that I am unable to refill all of her medications at this time since she is no longer following at our office and has relocated.  She has already re-established previous PCP in [place] - Dr Georgina Peer at [facility], in January 2019.  She should contact their office or locate other PCP in that area for her rx.  Nobie Putnam, [facility] 05/25/2017, 12:49 PM

## 2017-05-29 ENCOUNTER — Encounter: Payer: Self-pay | Admitting: Podiatry

## 2017-05-29 ENCOUNTER — Ambulatory Visit (INDEPENDENT_AMBULATORY_CARE_PROVIDER_SITE_OTHER): Payer: Medicare PPO | Admitting: Podiatry

## 2017-05-29 DIAGNOSIS — M76822 Posterior tibial tendinitis, left leg: Secondary | ICD-10-CM | POA: Diagnosis not present

## 2017-05-29 DIAGNOSIS — M779 Enthesopathy, unspecified: Secondary | ICD-10-CM

## 2017-05-29 DIAGNOSIS — M659 Synovitis and tenosynovitis, unspecified: Secondary | ICD-10-CM | POA: Diagnosis not present

## 2017-05-29 DIAGNOSIS — M7751 Other enthesopathy of right foot: Secondary | ICD-10-CM

## 2017-06-01 ENCOUNTER — Telehealth: Payer: Self-pay | Admitting: Podiatry

## 2017-06-01 ENCOUNTER — Telehealth: Payer: Self-pay

## 2017-06-01 NOTE — Telephone Encounter (Signed)
Returned call and left another message for patient to call office

## 2017-06-01 NOTE — Telephone Encounter (Signed)
Returned patient call and left voice message to call office back to discuss foot pain

## 2017-06-01 NOTE — Telephone Encounter (Signed)
I finally spoke with patient.  She states that she is still having a lot of pain in her toe and foot after the injections and she thinks that she is having gout from a "blood blister" on her toe.  I tried to explain to her the gout does not come from that but she did not want to here what I has to tell her.  She requested that she only wanted to talk to the Doctor.  She request a call from only you.   Please call and discuss her pain.  Thanks!

## 2017-06-01 NOTE — Telephone Encounter (Signed)
Please call pt. Again, and she said could you please leave a message

## 2017-06-01 NOTE — Telephone Encounter (Signed)
Patient seen Dr. Amalia Hailey this week in the [facility] location. She is having some severe pain and she did receive 3 injections. She needs to know what to do. Please give her a call asap.

## 2017-06-01 NOTE — Telephone Encounter (Signed)
Per Dr.Evans, after clinic he attempted to contact patient several times to discuss foot pain, but had to leave message on patients voice mail.

## 2017-06-01 NOTE — Progress Notes (Signed)
    HPI: 67 year old female presenting today with a chief complaint of right ankle pain that began several months ago. She also reports a blood blister to the right great toe that appeared one year ago. Walking and bearing weight increases the pain. She has not had any treatment of the symptoms. Patient is here for further evaluation and treatment.   Past Medical History:  Diagnosis Date  . Allergic rhinitis   . Anemia   . Blurred vision   . Depression   . Diverticulosis   . Endometriosis   . Falls   . GERD (gastroesophageal reflux disease)   . Hernia, hiatal   . Hypothyroidism   . IBS (irritable bowel syndrome)   . Malignant neoplasm of skin   . Migraine   . Neuropathy   . PTSD (post-traumatic stress disorder)   . Thyroid disease        Physical Exam: General: The patient is alert and oriented x3 in no acute distress.  Dermatology: Skin is warm, dry and supple bilateral lower extremities. Negative for open lesions or macerations.  Vascular: Palpable pedal pulses bilaterally. No edema or erythema noted. Capillary refill within normal limits.  Neurological: Epicritic and protective threshold grossly intact bilaterally.   Musculoskeletal Exam: Pain on palpation noted to the posterior tibial tendon of the left lower extremity. Pain with palpation to the anterior, medial and lateral aspects of the right ankle as well as the the right hallux. Range of motion within normal limits. Muscle strength 5/5 in all muscle groups bilateral lower extremities.  Assessment: 1. Posterior tibial tendinitis left 2. Ankle synovitis right 3. Right hallux capsulitis    Plan of Care:  1. Patient was evaluated. Radiographs were reviewed today. 2. Injection of 0.5 mL Celestone Soluspan injected into the posterior tibial tendon sheath.  3. Injection of 0.5 mLs Celestone Soluspan injected into the right ankle joint.  4. Injection of 0.5 mLs Celestone Soluspan injected into the right hallux. 5.  Prescription for New Balance shoes and insoles provided to patient.  6. Return to clinic as needed.    Edrick Kins, DPM [facility]  Dr. Edrick Kins, [address]                                        [place], [place] [address]                Office [phone]  Fax [phone]

## 2017-06-09 DIAGNOSIS — M545 Low back pain: Secondary | ICD-10-CM

## 2017-06-09 DIAGNOSIS — G8929 Other chronic pain: Secondary | ICD-10-CM | POA: Insufficient documentation

## 2017-07-05 DIAGNOSIS — M2041 Other hammer toe(s) (acquired), right foot: Secondary | ICD-10-CM | POA: Insufficient documentation

## 2017-07-05 DIAGNOSIS — M25373 Other instability, unspecified ankle: Secondary | ICD-10-CM | POA: Insufficient documentation

## 2017-07-05 DIAGNOSIS — M2010 Hallux valgus (acquired), unspecified foot: Secondary | ICD-10-CM | POA: Insufficient documentation

## 2017-07-05 DIAGNOSIS — M2042 Other hammer toe(s) (acquired), left foot: Secondary | ICD-10-CM

## 2017-07-11 DIAGNOSIS — F329 Major depressive disorder, single episode, unspecified: Secondary | ICD-10-CM

## 2017-07-16 ENCOUNTER — Other Ambulatory Visit: Payer: Self-pay | Admitting: Otolaryngology

## 2017-07-16 ENCOUNTER — Ambulatory Visit
Admission: RE | Admit: 2017-07-16 | Discharge: 2017-07-16 | Disposition: A | Discharge status: Home / Self Care | Payer: Medicare PPO | Source: Ambulatory Visit | Attending: Otolaryngology | Admitting: Otolaryngology

## 2017-07-16 DIAGNOSIS — J329 Chronic sinusitis, unspecified: Secondary | ICD-10-CM

## 2017-07-23 ENCOUNTER — Emergency Department
Admission: EM | Admit: 2017-07-23 | Discharge: 2017-07-23 | Discharge status: Against Medical Advice | Payer: Medicare PPO | Attending: Emergency Medicine | Admitting: Emergency Medicine

## 2017-07-23 ENCOUNTER — Encounter: Payer: Self-pay | Admitting: Emergency Medicine

## 2017-07-23 DIAGNOSIS — W5501XA Bitten by cat, initial encounter: Secondary | ICD-10-CM | POA: Diagnosis not present

## 2017-07-23 DIAGNOSIS — E039 Hypothyroidism, unspecified: Secondary | ICD-10-CM | POA: Diagnosis not present

## 2017-07-23 DIAGNOSIS — T7840XA Allergy, unspecified, initial encounter: Secondary | ICD-10-CM | POA: Insufficient documentation

## 2017-07-23 DIAGNOSIS — Z91010 Allergy to peanuts: Secondary | ICD-10-CM | POA: Diagnosis not present

## 2017-07-23 DIAGNOSIS — Z87891 Personal history of nicotine dependence: Secondary | ICD-10-CM | POA: Insufficient documentation

## 2017-07-23 DIAGNOSIS — I10 Essential (primary) hypertension: Secondary | ICD-10-CM | POA: Diagnosis not present

## 2017-07-23 DIAGNOSIS — Z79899 Other long term (current) drug therapy: Secondary | ICD-10-CM | POA: Insufficient documentation

## 2017-07-23 NOTE — ED Notes (Signed)
Patient eloped.  See note

## 2017-07-23 NOTE — ED Notes (Signed)
Patient states "I am not staying here in the ED any longer.  I don't agree with the diagnoses here.  Attempted to encourage patient to stay.  Patient declined.  Patient states "I am going to let the paper know about this".  Patient AAOx3.  Skin warm and dry.  Respirations regular and non labored.  No SOB/ DOE.  Ambulates independently with walker.  Speaks in load clear voice.  Full sentences.

## 2017-07-23 NOTE — ED Notes (Signed)
Patient ambulated to BR independently.  No SOB/ DOE.  Tolerated well.  NAD

## 2017-07-23 NOTE — ED Triage Notes (Signed)
Patient presents to the ED post cat bite from last night.  Patient states she took 1 benadryl tablet last night after calling poison control.  Patient reports feeling short of breath and tightness in her chest.  Patient states she has had an anaphylactic reaction to peanuts in the past and feels she is having an allergic reaction today.  Patient states, "I couldn't sleep last night, I felt like my nasal passages were swelling up and like I was having hives."  No rash noted to arms or face at this time.  No swelling noted.  Speaking in full sentences without obvious difficulty.

## 2017-07-23 NOTE — ED Provider Notes (Signed)
[facility] Emergency Department Provider Note   ____________________________________________    I have reviewed the triage vital signs and the nursing notes.   HISTORY  Chief Complaint Animal Bite     HPI Angela [name] is a 67 y.o. female who presents with concern that she is having allergic reaction.  Patient reports she was bitten by a cat yesterday evening.  She states last night her nasal passages were swollen and she is convinced that she is having an allergic reaction and requires shot of epinephrine and a shot of antihistamine, she is quite angry that she has had to wait for 30 minutes to be seen.  I tried to reassure her that she does not appear to be having an anaphylactic reaction but she seems to think that I am being dismissive and has decided to leave   Past Medical History:  Diagnosis Date  . Allergic rhinitis   . Anemia   . Blurred vision   . Depression   . Diverticulosis   . Endometriosis   . Falls   . GERD (gastroesophageal reflux disease)   . Hernia, hiatal   . Hypothyroidism   . IBS (irritable bowel syndrome)   . Malignant neoplasm of skin   . Migraine   . Neuropathy   . PTSD (post-traumatic stress disorder)   . Thyroid disease     Patient Active Problem List   Diagnosis Date Noted  . IBS (irritable bowel syndrome) 11/17/2016  . Bilateral carpal tunnel syndrome 11/17/2016  . Plantar fasciitis, bilateral 11/17/2016  . Chronic pain syndrome 11/17/2016  . Chronic hyponatremia 11/17/2016  . PTSD (post-traumatic stress disorder) 11/17/2016  . Hernia, hiatal 11/17/2016  . Vitamin D deficiency 11/17/2016  . Vitamin B12 deficiency 11/17/2016  . SIADH (syndrome of inappropriate ADH production) ([place]) 09/08/2016  . Cervical spondylosis with myelopathy 05/24/2016  . Post-concussion headache 05/24/2016  . MCI (mild cognitive impairment) with memory loss 03/30/2016  . Gait abnormality 03/30/2016  . Chronic bipolar affective  disorder ([place]) 01/24/2016  . Anemia, iron deficiency 06/24/2015  . Endometriosis 06/24/2015  . Essential hypertension 06/24/2015  . History of postmenopausal bleeding 05/22/2015  . History of TIA (transient ischemic attack) 04/20/2015  . Hypothyroidism 04/20/2015  . GERD (gastroesophageal reflux disease) 04/20/2015  . Cervical disc disorder with radiculopathy 03/25/2015  . Chronic neck pain 03/25/2015  . Pelvic pain in female 10/30/2014  . History of skin cancer 06/30/2014  . Chronic headaches 12/16/2013  . Neuropathy 12/16/2013    Past Surgical History:  Procedure Laterality Date  . APPENDECTOMY    . CHOLECYSTECTOMY    . CHOLECYSTECTOMY, LAPAROSCOPIC    . ESOPHAGOGASTRODUODENOSCOPY (EGD) WITH PROPOFOL N/A 03/29/2015   Procedure: ESOPHAGOGASTRODUODENOSCOPY (EGD) WITH PROPOFOL;  Surgeon: Josefine Class, MD;  Location: [facility] ENDOSCOPY;  Service: Endoscopy;  Laterality: N/A;  . HERNIA REPAIR    . laproscopy    . TONSILLECTOMY    . uteral suspension      Prior to Admission medications   Medication Sig Start Date End Date Taking? Authorizing Provider  amLODipine (NORVASC) 5 MG tablet Take 1 tablet (5 mg total) by mouth 2 (two) times daily. 12/13/16   Karamalegos, Alexander J, DO  ANORO ELLIPTA 62.5-25 MCG/INH AEPB Inhale 1 puff into the lungs daily. 11/23/16   [provider]  calcium carbonate (OS-CAL) 600 MG TABS tablet Take 1 tablet (600 mg total) by mouth 2 (two) times daily with a meal. Patient taking differently: Take 600 mg 3 (three) times  daily by mouth.  10/28/16   Shary Decamp, PA-C  conjugated estrogens (PREMARIN) vaginal cream Place vaginally 3 (three) times a week. Use 1/2 gram 3-4 times weekly. 05/02/17   Rubie Maid, MD  cycloSPORINE (RESTASIS) 0.05 % ophthalmic emulsion Place 1 drop into both eyes 2 (two) times daily. 01/12/17   Karamalegos, Devonne Doughty, DO  Diclofenac Sodium (SOLARAZE) 3 % GEL Place 1 application onto the skin 3 (three) times daily.  01/02/17   Edrick Kins, DPM  ferrous sulfate (FERROUSUL) 325 (65 FE) MG tablet Take 1 tablet (325 mg total) by mouth daily with breakfast. 05/01/17   Rubie Maid, MD  gabapentin (NEURONTIN) 100 MG capsule Take 1-2 capsules (100-200 mg total) by mouth 3 (three) times daily. 01/12/17   Karamalegos, Devonne Doughty, DO  guaiFENesin (MUCINEX) 600 MG 12 hr tablet Take 1 tablet (600 mg total) by mouth 2 (two) times daily as needed for cough. 12/26/16 12/26/17  Lisa Roca, MD  hydrocortisone (PROCTOZONE-HC) 2.5 % rectal cream Place 1 application rectally 2 (two) times daily. 01/12/17   Olin Hauser, DO  levothyroxine (SYNTHROID, LEVOTHROID) 100 MCG tablet Take 1 tablet (100 mcg total) by mouth daily before breakfast. 12/13/16   Parks Ranger, Devonne Doughty, DO  linaclotide (LINZESS) 290 MCG CAPS capsule Take 1 capsule (290 mcg total) by mouth daily before breakfast. 12/13/16   Karamalegos, Devonne Doughty, DO  magnesium oxide (MAG-OX) 400 MG tablet Take 400 mg by mouth daily with breakfast.    [provider]  pantoprazole (PROTONIX) 40 MG tablet Take 1 tablet (40 mg total) by mouth daily before breakfast. Patient not taking: Reported on 12/26/2016 12/13/16   Olin Hauser, DO  Polyethyl Glycol-Propyl Glycol (SYSTANE OP) Place 1 drop into both eyes daily as needed (dry eyes).    [provider]  polyethylene glycol powder (GLYCOLAX/MIRALAX) powder Take 17-34 g by mouth daily. 11/17/16   Karamalegos, Devonne Doughty, DO  Pyridoxine HCl (VITAMIN B-6) 500 MG tablet Take 1 tablet by mouth 2 (two) times daily.    [provider]  ranitidine (ZANTAC) 150 MG tablet Take 150 mg by mouth daily before supper.    [provider]  tiZANidine (ZANAFLEX) 2 MG tablet Take 0.5-1 tablets (1-2 mg total) by mouth 2 (two) times daily as needed for muscle spasms. 12/13/16   Karamalegos, Devonne Doughty, DO  tolterodine (DETROL LA) 2 MG 24 hr capsule Take 1 capsule (2 mg total) daily  by mouth. Patient not taking: Reported on 12/26/2016 12/01/16   Rubie Maid, MD  traZODone (DESYREL) 50 MG tablet Take 25-50 tablets by mouth daily.  11/09/16   [provider]  White Petrolatum-Mineral Oil (GENTEAL TEARS NIGHT-TIME) OINT Place 1 application into both eyes at bedtime.    [provider]     Allergies Bee venom; Darvon [propoxyphene]; Dicyclomine; Other; Oxycodone-acetaminophen; Peanuts [peanut oil]; Penicillins; Sulfa antibiotics; Sulfacetamide sodium; Ultram [tramadol]; Amikacin; Aspirin; Doxycycline; Haloperidol; Hymenoptera venom preparations; Imipramine; Keflex [cephalexin]; Sulfur; Adhesive [tape]; Hydroxyzine; and Prednisone  Family History  Problem Relation Age of Onset  . Heart disease Father   . Cancer Mother        lung  . Urolithiasis Neg Hx   . Kidney disease Neg Hx   . Kidney cancer Neg Hx   . Prostate cancer Neg Hx     Social History Social History   Tobacco Use  . Smoking status: Former Smoker    Last attempt to quit: 11/17/1976    Years since quitting: 40.7  .  Smokeless tobacco: Never Used  Substance Use Topics  . Alcohol use: No  . Drug use: No    Review of Systems  Constitutional: No dizziness Eyes: No swelling ENT: No throat swelling  Gastrointestinal: No nausea, no vomiting.    Musculoskeletal: Negative for joint swelling Skin: No rash Neurological: Negative for headaches   ____________________________________________   PHYSICAL EXAM:  VITAL SIGNS: ED Triage Vitals  Enc Vitals Group     BP 07/23/17 0947 134/78     Pulse Rate 07/23/17 0947 (!) 53     Resp 07/23/17 0947 20     Temp 07/23/17 0947 98.5 F (36.9 C)     Temp Source 07/23/17 0947 Oral     SpO2 07/23/17 0947 98 %     Weight 07/23/17 0952 68 kg (150 lb)     Height 07/23/17 0952 1.676 m (5\' 6" )     Head Circumference --      Peak Flow --      Pain Score 07/23/17 1022 0     Pain Loc --      Pain Edu? --      Excl. in [place]? --      Constitutional: Alert and oriented. No acute distress.  Anxious eyes: Conjunctivae are normal.   Nose: No congestion/rhinnorhea.  No swelling noted Mouth/Throat: Mucous membranes are moist.  No pharyngeal swelling  Cardiovascular: Normal rate, regular rhythm. Grossly normal heart sounds.  Good peripheral circulation. Respiratory: Normal respiratory effort.  No retractions. Lungs CTAB.   Musculoskeletal: No lower extremity tenderness nor edema.  Warm and well perfused Neurologic:  Normal speech and language. No gross focal neurologic deficits are appreciated.  Skin:  Skin is warm, dry and intact. No rash noted. Psychiatric: Angry and aggressive  ____________________________________________   LABS (all labs ordered are listed, but only abnormal results are displayed)  Labs Reviewed - No data to display ____________________________________________  EKG  ED ECG REPORT I, Lavonia Drafts, the attending physician, personally viewed and interpreted this ECG.  Date: 07/23/2017  Rhythm: normal sinus rhythm QRS Axis: normal Intervals: normal ST/T Wave abnormalities: normal Narrative Interpretation: no evidence of acute ischemia  ____________________________________________  RADIOLOGY  None ____________________________________________   PROCEDURES  Procedure(s) performed: No  Procedures   Critical Care performed: No ____________________________________________   INITIAL IMPRESSION / ASSESSMENT AND PLAN / ED COURSE  Pertinent labs & imaging results that were available during my care of the patient were reviewed by me and considered in my medical decision making (see chart for details).  I informed the patient that we would be treating her with p.o. Benadryl as no evidence of severe allergic reaction, she became very upset by this and decided to leave.  She appears to have decisional capacity, she tells me that she is going to the newspaper to report me.     ____________________________________________   FINAL CLINICAL IMPRESSION(S) / ED DIAGNOSES  Final diagnoses:  Cat bite, initial encounter        Note:  This document was prepared using Dragon voice recognition software and may include unintentional dictation errors.    Lavonia Drafts, MD 07/23/17 [phone]

## 2017-08-15 ENCOUNTER — Encounter: Payer: Self-pay | Admitting: Emergency Medicine

## 2017-08-15 ENCOUNTER — Emergency Department
Admission: EM | Admit: 2017-08-15 | Discharge: 2017-08-15 | Disposition: A | Discharge status: Home / Self Care | Payer: Medicare PPO | Attending: Emergency Medicine | Admitting: Emergency Medicine

## 2017-08-15 DIAGNOSIS — K581 Irritable bowel syndrome with constipation: Secondary | ICD-10-CM

## 2017-08-15 DIAGNOSIS — Z8673 Personal history of transient ischemic attack (TIA), and cerebral infarction without residual deficits: Secondary | ICD-10-CM | POA: Diagnosis not present

## 2017-08-15 DIAGNOSIS — Z85828 Personal history of other malignant neoplasm of skin: Secondary | ICD-10-CM | POA: Insufficient documentation

## 2017-08-15 DIAGNOSIS — M5010 Cervical disc disorder with radiculopathy, unspecified cervical region: Secondary | ICD-10-CM

## 2017-08-15 DIAGNOSIS — I10 Essential (primary) hypertension: Secondary | ICD-10-CM | POA: Diagnosis not present

## 2017-08-15 DIAGNOSIS — G894 Chronic pain syndrome: Secondary | ICD-10-CM

## 2017-08-15 DIAGNOSIS — R109 Unspecified abdominal pain: Secondary | ICD-10-CM

## 2017-08-15 DIAGNOSIS — D508 Other iron deficiency anemias: Secondary | ICD-10-CM

## 2017-08-15 DIAGNOSIS — Z79899 Other long term (current) drug therapy: Secondary | ICD-10-CM | POA: Diagnosis not present

## 2017-08-15 DIAGNOSIS — Z87891 Personal history of nicotine dependence: Secondary | ICD-10-CM | POA: Diagnosis not present

## 2017-08-15 DIAGNOSIS — R195 Other fecal abnormalities: Secondary | ICD-10-CM | POA: Diagnosis not present

## 2017-08-15 DIAGNOSIS — G5603 Carpal tunnel syndrome, bilateral upper limbs: Secondary | ICD-10-CM

## 2017-08-15 DIAGNOSIS — E039 Hypothyroidism, unspecified: Secondary | ICD-10-CM | POA: Diagnosis not present

## 2017-08-15 LAB — COMPREHENSIVE METABOLIC PANEL
ALT: 33 U/L (ref 0–44)
AST: 39 U/L (ref 15–41)
Albumin: 4.5 g/dL (ref 3.5–5.0)
Alkaline Phosphatase: 82 U/L (ref 38–126)
Anion gap: 10 (ref 5–15)
BILIRUBIN TOTAL: 0.7 mg/dL (ref 0.3–1.2)
BUN: 12 mg/dL (ref 8–23)
CHLORIDE: 101 mmol/L (ref 98–111)
CO2: 24 mmol/L (ref 22–32)
Calcium: 9.6 mg/dL (ref 8.9–10.3)
Creatinine, Ser: 0.65 mg/dL (ref 0.44–1.00)
Glucose, Bld: 90 mg/dL (ref 70–99)
POTASSIUM: 3.5 mmol/L (ref 3.5–5.1)
Sodium: 135 mmol/L (ref 135–145)
Total Protein: 7.7 g/dL (ref 6.5–8.1)

## 2017-08-15 LAB — CBC
HEMATOCRIT: 40.2 % (ref 35.0–47.0)
Hemoglobin: 14.1 g/dL (ref 12.0–16.0)
MCH: 31.8 pg (ref 26.0–34.0)
MCHC: 34.9 g/dL (ref 32.0–36.0)
MCV: 91 fL (ref 80.0–100.0)
PLATELETS: 343 10*3/uL (ref 150–440)
RBC: 4.42 MIL/uL (ref 3.80–5.20)
RDW: 14.2 % (ref 11.5–14.5)
WBC: 6.1 10*3/uL (ref 3.6–11.0)

## 2017-08-15 LAB — LIPASE, BLOOD: LIPASE: 38 U/L (ref 11–51)

## 2017-08-15 MED ORDER — GABAPENTIN 100 MG PO CAPS: 100.0000 mg | ORAL_CAPSULE | Freq: Three times a day (TID) | ORAL | 1 refills | Status: DC

## 2017-08-15 MED ORDER — AMLODIPINE BESYLATE 5 MG PO TABS: 5.0000 mg | ORAL_TABLET | Freq: Two times a day (BID) | ORAL | 1 refills | Status: DC

## 2017-08-15 MED ORDER — ANORO ELLIPTA 62.5-25 MCG/INH IN AEPB: 1.0000 | INHALATION_SPRAY | Freq: Every day | RESPIRATORY_TRACT | 0 refills | Status: DC

## 2017-08-15 MED ORDER — FERROUS SULFATE 325 (65 FE) MG PO TABS
325.0000 mg | ORAL_TABLET | Freq: Every day | ORAL | 1 refills | Status: DC
Start: 2017-08-15 — End: 2017-08-15

## 2017-08-15 MED ORDER — LEVOTHYROXINE SODIUM 100 MCG PO TABS: 100.0000 ug | ORAL_TABLET | Freq: Every day | ORAL | 1 refills | Status: DC

## 2017-08-15 MED ORDER — FERROUS SULFATE 325 (65 FE) MG PO TABS: 325.0000 mg | ORAL_TABLET | Freq: Every day | ORAL | 1 refills | Status: DC

## 2017-08-15 MED ORDER — LINACLOTIDE 290 MCG PO CAPS: 290.0000 ug | ORAL_CAPSULE | Freq: Every day | ORAL | 1 refills | Status: DC

## 2017-08-15 NOTE — ED Provider Notes (Signed)
[facility] Emergency Department Provider Note    ____________________________________________   I have reviewed the triage vital signs and the nursing notes.   HISTORY  Chief Complaint Abdominal Pain and GI Problem   History limited by: Not Limited   HPI Angela [name] is a 67 y.o. female who presents to the emergency department today with concern for GI bleed.  Patient states that she noticed some red stool today.  She is does state that she has been having some abdominal pain which she thinks is secondary to a mesh that was put in for hernia repair.  She has not had a chance to talk to a surgeon about this at this time.  She states the original surgeon is left.  She is additionally concerned that she does not like where she is currently living because of fleas and cats.  She states she does not have a primary care physician.   Per medical record review patient has a history of IBS  Past Medical History:  Diagnosis Date  . Allergic rhinitis   . Anemia   . Blurred vision   . Depression   . Diverticulosis   . Endometriosis   . Falls   . GERD (gastroesophageal reflux disease)   . Hernia, hiatal   . Hypothyroidism   . IBS (irritable bowel syndrome)   . Malignant neoplasm of skin   . Migraine   . Neuropathy   . PTSD (post-traumatic stress disorder)   . Thyroid disease     Patient Active Problem List   Diagnosis Date Noted  . IBS (irritable bowel syndrome) 11/17/2016  . Bilateral carpal tunnel syndrome 11/17/2016  . Plantar fasciitis, bilateral 11/17/2016  . Chronic pain syndrome 11/17/2016  . Chronic hyponatremia 11/17/2016  . PTSD (post-traumatic stress disorder) 11/17/2016  . Hernia, hiatal 11/17/2016  . Vitamin D deficiency 11/17/2016  . Vitamin B12 deficiency 11/17/2016  . SIADH (syndrome of inappropriate ADH production) ([place]) 09/08/2016  . Cervical spondylosis with myelopathy 05/24/2016  . Post-concussion headache 05/24/2016  . MCI  (mild cognitive impairment) with memory loss 03/30/2016  . Gait abnormality 03/30/2016  . Chronic bipolar affective disorder ([place]) 01/24/2016  . Anemia, iron deficiency 06/24/2015  . Endometriosis 06/24/2015  . Essential hypertension 06/24/2015  . History of postmenopausal bleeding 05/22/2015  . History of TIA (transient ischemic attack) 04/20/2015  . Hypothyroidism 04/20/2015  . GERD (gastroesophageal reflux disease) 04/20/2015  . Cervical disc disorder with radiculopathy 03/25/2015  . Chronic neck pain 03/25/2015  . Pelvic pain in female 10/30/2014  . History of skin cancer 06/30/2014  . Chronic headaches 12/16/2013  . Neuropathy 12/16/2013    Past Surgical History:  Procedure Laterality Date  . APPENDECTOMY    . CHOLECYSTECTOMY    . CHOLECYSTECTOMY, LAPAROSCOPIC    . ESOPHAGOGASTRODUODENOSCOPY (EGD) WITH PROPOFOL N/A 03/29/2015   Procedure: ESOPHAGOGASTRODUODENOSCOPY (EGD) WITH PROPOFOL;  Surgeon: Josefine Class, MD;  Location: [facility] ENDOSCOPY;  Service: Endoscopy;  Laterality: N/A;  . HERNIA REPAIR    . laproscopy    . TONSILLECTOMY    . uteral suspension      Prior to Admission medications   Medication Sig Start Date End Date Taking? Authorizing Provider  amLODipine (NORVASC) 5 MG tablet Take 1 tablet (5 mg total) by mouth 2 (two) times daily. 08/15/17   Nance Pear, MD  ANORO ELLIPTA 62.5-25 MCG/INH AEPB Inhale 1 puff into the lungs daily. 08/15/17   Nance Pear, MD  calcium carbonate (OS-CAL) 600 MG TABS tablet Take 1  tablet (600 mg total) by mouth 2 (two) times daily with a meal. Patient taking differently: Take 600 mg 3 (three) times daily by mouth.  10/28/16   Shary Decamp, PA-C  citalopram (CELEXA) 20 MG tablet Take 20 mg by mouth daily. 07/14/17   [provider]  conjugated estrogens (PREMARIN) vaginal cream Place vaginally 3 (three) times a week. Use 1/2 gram 3-4 times weekly. 05/02/17   Rubie Maid, MD  cycloSPORINE (RESTASIS) 0.05 % ophthalmic  emulsion Place 1 drop into both eyes 2 (two) times daily. 01/12/17   Karamalegos, Devonne Doughty, DO  Diclofenac Sodium (SOLARAZE) 3 % GEL Place 1 application onto the skin 3 (three) times daily. 01/02/17   Edrick Kins, DPM  diclofenac sodium (VOLTAREN) 1 % GEL Apply 2 g topically 4 (four) times daily. 06/13/17   [provider]  ferrous sulfate (FERROUSUL) 325 (65 FE) MG tablet Take 1 tablet (325 mg total) by mouth daily with breakfast. 08/15/17   Nance Pear, MD  gabapentin (NEURONTIN) 100 MG capsule Take 1-2 capsules (100-200 mg total) by mouth 3 (three) times daily. 08/15/17   Nance Pear, MD  guaiFENesin (MUCINEX) 600 MG 12 hr tablet Take 1 tablet (600 mg total) by mouth 2 (two) times daily as needed for cough. 12/26/16 12/26/17  Lisa Roca, MD  hydrocortisone (PROCTOZONE-HC) 2.5 % rectal cream Place 1 application rectally 2 (two) times daily. 01/12/17   Karamalegos, Devonne Doughty, DO  hydrOXYzine (ATARAX/VISTARIL) 10 MG tablet Take 1 tablet by mouth 2 (two) times daily. 06/19/17   [provider]  levothyroxine (SYNTHROID, LEVOTHROID) 100 MCG tablet Take 1 tablet (100 mcg total) by mouth daily before breakfast. 08/15/17   Nance Pear, MD  linaclotide [facility]) 290 MCG CAPS capsule Take 1 capsule (290 mcg total) by mouth daily before breakfast. 08/15/17   Nance Pear, MD  magnesium oxide (MAG-OX) 400 MG tablet Take 400 mg by mouth daily with breakfast.    [provider]  montelukast (SINGULAIR) 10 MG tablet Take 1 tablet by mouth at bedtime. 08/10/17   [provider]  omeprazole (PRILOSEC) 20 MG capsule Take 1 capsule by mouth daily. 08/14/17   [provider]  pantoprazole (PROTONIX) 40 MG tablet Take 1 tablet (40 mg total) by mouth daily before breakfast. Patient not taking: Reported on 12/26/2016 12/13/16   Olin Hauser, DO  Polyethyl Glycol-Propyl Glycol (SYSTANE OP) Place 1 drop into both eyes daily as needed (dry eyes).     [provider]  polyethylene glycol powder (GLYCOLAX/MIRALAX) powder Take 17-34 g by mouth daily. 11/17/16   Karamalegos, Devonne Doughty, DO  Pyridoxine HCl (VITAMIN B-6) 500 MG tablet Take 1 tablet by mouth 2 (two) times daily.    [provider]  ranitidine (ZANTAC) 150 MG tablet Take 150 mg by mouth daily before supper.    [provider]  tiZANidine (ZANAFLEX) 2 MG tablet Take 0.5-1 tablets (1-2 mg total) by mouth 2 (two) times daily as needed for muscle spasms. 12/13/16   Karamalegos, Devonne Doughty, DO  tolterodine (DETROL LA) 2 MG 24 hr capsule Take 1 capsule (2 mg total) daily by mouth. Patient not taking: Reported on 12/26/2016 12/01/16   Rubie Maid, MD  traZODone (DESYREL) 50 MG tablet Take 25-50 tablets by mouth daily.  11/09/16   [provider]  White Petrolatum-Mineral Oil (GENTEAL TEARS NIGHT-TIME) OINT Place 1 application into both eyes at bedtime.    [provider]    Allergies Bee venom; Darvon [propoxyphene]; Dicyclomine;  Other; Oxycodone-acetaminophen; Peanuts [peanut oil]; Penicillins; Sulfa antibiotics; Sulfacetamide sodium; Ultram [tramadol]; Amikacin; Aspirin; Doxycycline; Haloperidol; Hymenoptera venom preparations; Imipramine; Keflex [cephalexin]; Sulfur; Adhesive [tape]; Hydroxyzine; and Prednisone  Family History  Problem Relation Age of Onset  . Heart disease Father   . Cancer Mother        lung  . Urolithiasis Neg Hx   . Kidney disease Neg Hx   . Kidney cancer Neg Hx   . Prostate cancer Neg Hx     Social History Social History   Tobacco Use  . Smoking status: Former Smoker    Last attempt to quit: 11/17/1976    Years since quitting: 40.7  . Smokeless tobacco: Never Used  Substance Use Topics  . Alcohol use: No  . Drug use: No    Review of Systems Constitutional: No fever/chills Eyes: No visual changes. ENT: No sore throat. Cardiovascular: Denies chest pain. Respiratory: Denies shortness of  breath. Gastrointestinal: Positive for abdominal pain. Positive for gi bleed. Genitourinary: Negative for dysuria. Musculoskeletal: Negative for back pain. Skin: Negative for rash. Neurological: Negative for headaches, focal weakness or numbness.  ____________________________________________   PHYSICAL EXAM:  VITAL SIGNS: ED Triage Vitals [08/15/17 1526]  Enc Vitals Group     BP 137/71     Pulse Rate (!) 59     Resp 20     Temp 98.2 F (36.8 C)     Temp Source Oral     SpO2 100 %     Weight 140 lb (63.5 kg)     Height 5\' 3"  (1.6 m)     Head Circumference      Peak Flow      Pain Score 9   Constitutional: Alert and oriented.  Eyes: Conjunctivae are normal.  ENT      Head: Normocephalic and atraumatic.      Nose: No congestion/rhinnorhea.      Mouth/Throat: Mucous membranes are moist.      Neck: No stridor. Hematological/Lymphatic/Immunilogical: No cervical lymphadenopathy. Cardiovascular: Normal rate, regular rhythm.  No murmurs, rubs, or gallops.  Respiratory: Normal respiratory effort without tachypnea nor retractions. Breath sounds are clear and equal bilaterally. No wheezes/rales/rhonchi. Gastrointestinal: Soft and non tender. No rebound. No guarding.  Rectal: Performed with RN Elmyra Ricks in room. No melena, no blood on glove. GUIAC negative. Musculoskeletal: Normal range of motion in all extremities. No lower extremity edema. Neurologic:  Normal speech and language. No gross focal neurologic deficits are appreciated.  Skin:  Skin is warm, dry and intact. No rash noted. Psychiatric: Mood and affect are normal. Speech and behavior are normal. Patient exhibits appropriate insight and judgment.  ____________________________________________    LABS (pertinent positives/negatives)  Lipase 38 CMP wnl CBC wnl  ____________________________________________   EKG  I, Nance Pear, attending physician, personally viewed and interpreted this EKG  EKG Time:  1527 Rate: 59 Rhythm: sinus bradycardia Axis: normal Intervals: qtc 429 QRS: narrow ST changes: no st elevation Impression: abnormal ekg  ____________________________________________    RADIOLOGY  None  ____________________________________________   PROCEDURES  Procedures  ____________________________________________   INITIAL IMPRESSION / ASSESSMENT AND PLAN / ED COURSE  Pertinent labs & imaging results that were available during my care of the patient were reviewed by me and considered in my medical decision making (see chart for details).   Patient presented to the emergency department today because of concerns for GI bleed.  Rectal exam was negative guaiac was negative.  Blood work did not show any anemia or concerning leukocytosis.  This point I doubt it is related to the mesh however given lack of leukocytosis do not feel any emergent imaging is necessary.  Discussed with patient importance of primary care and surgery follow-up.  Will give patient information for both.  Additionally patient was concerned about having some fleas where she is living.  At this point discussed with patient importance of reaching out to community resources.  ____________________________________________   FINAL CLINICAL IMPRESSION(S) / ED DIAGNOSES  Final diagnoses:  Abdominal pain, unspecified abdominal location     Note: This dictation was prepared with Dragon dictation. Any transcriptional errors that result from this process are unintentional     Nance Pear, MD 08/15/17 1742

## 2017-08-15 NOTE — ED Notes (Signed)
Pt with flight of thoughts and is erratic with her speech. Pt reports having blood in her stool this morning and is "worried the hernia mesh has moved." This RN witnessed rectal exam by MD Archie Balboa and it was negative for occult blood. MD Archie Balboa offered pt resources for a PCP and to set up a place to stay. Pt states "I just can't keep calling all of these people, what do you want me to do?" MD Archie Balboa discussing options for pt and resources. Pt yelling at staff and states "I can't go back there, they have wild cats running around!" Call bell within reach, will continue to monitor.

## 2017-08-15 NOTE — ED Notes (Signed)
Pt refused vital signs prior to d/c.

## 2017-08-15 NOTE — Discharge Instructions (Addendum)
Please seek medical attention for any high fevers, chest pain, shortness of breath, change in behavior, persistent vomiting, bloody stool or any other new or concerning symptoms.  

## 2017-08-15 NOTE — ED Triage Notes (Signed)
EMS reports per pt an hour ago she had a bowel movement that had bright red blood in it. EMS reports pt also c/o abdominal pain. Pt states there has been a recall on the mesh they used to repair her hernia. Pt concerned it is related to the mesh.
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
## 2017-08-16 ENCOUNTER — Telehealth: Payer: Self-pay | Admitting: Surgery

## 2017-08-16 NOTE — Telephone Encounter (Signed)
Left a message for the patient to call the office. °

## 2017-08-16 NOTE — Telephone Encounter (Signed)
-----   Message from Mickie Kay sent at 08/16/2017 12:26 PM EDT ----- Regarding: ED referral Dr Dahlia Byes stated in the ED referral that the he would like to see the patient in 2 weeks.   New patient-Abdominal pain.

## 2017-08-17 ENCOUNTER — Emergency Department
Admission: EM | Admit: 2017-08-17 | Discharge: 2017-08-17 | Disposition: A | Discharge status: Home / Self Care | Payer: Medicare PPO | Attending: Emergency Medicine | Admitting: Emergency Medicine

## 2017-08-17 ENCOUNTER — Encounter: Payer: Self-pay | Admitting: Emergency Medicine

## 2017-08-17 ENCOUNTER — Other Ambulatory Visit: Payer: Self-pay

## 2017-08-17 DIAGNOSIS — Z79899 Other long term (current) drug therapy: Secondary | ICD-10-CM | POA: Insufficient documentation

## 2017-08-17 DIAGNOSIS — R062 Wheezing: Secondary | ICD-10-CM | POA: Diagnosis present

## 2017-08-17 DIAGNOSIS — E039 Hypothyroidism, unspecified: Secondary | ICD-10-CM | POA: Diagnosis not present

## 2017-08-17 DIAGNOSIS — Z91010 Allergy to peanuts: Secondary | ICD-10-CM | POA: Diagnosis not present

## 2017-08-17 DIAGNOSIS — Z87891 Personal history of nicotine dependence: Secondary | ICD-10-CM | POA: Diagnosis not present

## 2017-08-17 MED ORDER — LEVALBUTEROL HCL 0.63 MG/3ML IN NEBU: 0.6300 mg | INHALATION_SOLUTION | Freq: Once | RESPIRATORY_TRACT | Status: DC

## 2017-08-17 NOTE — ED Triage Notes (Signed)
Pt to ED via POV from "[facility]". Pt states that she was sent here to have a FL2 filled out so that she can go to a rehab facility in [place]. Pt states that is running fever, however, she is afebrile in triage at 98.9. Pt states that she has not eaten in 24 hours and that she would like to have a breathing treatment because it is hot outside. Pt is in NAD currently.

## 2017-08-17 NOTE — NC FL2 (Signed)
[place] LEVEL OF CARE SCREENING TOOL     IDENTIFICATION  Patient Name: Pearlene Teat Birthdate: 07-Aug-1950 Sex: female Admission Date (Current Location): 08/17/2017  [place] and [place] Number:  Engineering geologist and Address:  [facility], [address], [place], [place] [address]      Provider Number: [id number]  Attending Physician Name and Address:  Carrie Mew, MD  Relative Name and Phone Number:       Current Level of Care: Hospital Recommended Level of Care: Other (Comment), Family Care Home Prior Approval Number:    Date Approved/Denied:   PASRR Number:    Discharge Plan: Domiciliary (Rest home)    Current Diagnoses: Patient Active Problem List   Diagnosis Date Noted  . IBS (irritable bowel syndrome) 11/17/2016  . Bilateral carpal tunnel syndrome 11/17/2016  . Plantar fasciitis, bilateral 11/17/2016  . Chronic pain syndrome 11/17/2016  . Chronic hyponatremia 11/17/2016  . PTSD (post-traumatic stress disorder) 11/17/2016  . Hernia, hiatal 11/17/2016  . Vitamin D deficiency 11/17/2016  . Vitamin B12 deficiency 11/17/2016  . SIADH (syndrome of inappropriate ADH production) ([place]) 09/08/2016  . Cervical spondylosis with myelopathy 05/24/2016  . Post-concussion headache 05/24/2016  . MCI (mild cognitive impairment) with memory loss 03/30/2016  . Gait abnormality 03/30/2016  . Chronic bipolar affective disorder ([place]) 01/24/2016  . Anemia, iron deficiency 06/24/2015  . Endometriosis 06/24/2015  . Essential hypertension 06/24/2015  . History of postmenopausal bleeding 05/22/2015  . History of TIA (transient ischemic attack) 04/20/2015  . Hypothyroidism 04/20/2015  . GERD (gastroesophageal reflux disease) 04/20/2015  . Cervical disc disorder with radiculopathy 03/25/2015  . Chronic neck pain 03/25/2015  . Pelvic pain in female 10/30/2014  . History of skin cancer 06/30/2014  . Chronic headaches 12/16/2013  .  Neuropathy 12/16/2013    Orientation RESPIRATION BLADDER Height & Weight     Self, Time, Situation, Place  Normal Continent Weight: 140 lb (63.5 kg) Height:  5\' 3"  (160 cm)  BEHAVIORAL SYMPTOMS/MOOD NEUROLOGICAL BOWEL NUTRITION STATUS      Continent    AMBULATORY STATUS COMMUNICATION OF NEEDS Skin   Supervision Verbally Normal                       Personal Care Assistance Level of Assistance  Bathing, Feeding, Dressing, Total care Bathing Assistance: Independent Feeding assistance: Independent Dressing Assistance: Independent Total Care Assistance: Independent   Functional Limitations Info  Sight, Hearing, Speech Sight Info: Adequate Hearing Info: Adequate Speech Info: Adequate    SPECIAL CARE FACTORS FREQUENCY                       Contractures Contractures Info: Not present    Additional Factors Info  Allergies Code Status Info: Full Allergies Info: Bee Venom, Darvon Propoxyphene, Dicyclomine, Other, Oxycodone-acetaminophen, Peanuts Peanut Oil, Penicillins, Sulfa Antibiotics, Sulfacetamide Sodium, Ultram Tramadol, Amikacin, Aspirin, Doxycycline, Haloperidol, Hymenoptera Venom Preparations, Imipramine, Keflex Cephalexin, Sulfur, Adhesive Tape, Hydroxyzine, Prednisone           Current Medications (08/17/2017):  This is the current hospital active medication list No current facility-administered medications for this encounter.    Current Outpatient Medications  Medication Sig Dispense Refill  . amLODipine (NORVASC) 5 MG tablet Take 1 tablet (5 mg total) by mouth 2 (two) times daily. 180 tablet 1  . ANORO ELLIPTA 62.5-25 MCG/INH AEPB Inhale 1 puff into the lungs daily. 14 each 0  . calcium carbonate (OS-CAL) 600 MG TABS  tablet Take 1 tablet (600 mg total) by mouth 2 (two) times daily with a meal. (Patient taking differently: Take 600 mg 3 (three) times daily by mouth. ) 60 tablet 0  . citalopram (CELEXA) 20 MG tablet Take 20 mg by mouth daily.  0  .  conjugated estrogens (PREMARIN) vaginal cream Place vaginally 3 (three) times a week. Use 1/2 gram 3-4 times weekly. 42.5 g 4  . cycloSPORINE (RESTASIS) 0.05 % ophthalmic emulsion Place 1 drop into both eyes 2 (two) times daily. 60 each 5  . Diclofenac Sodium (SOLARAZE) 3 % GEL Place 1 application onto the skin 3 (three) times daily. 100 g 30  . diclofenac sodium (VOLTAREN) 1 % GEL Apply 2 g topically 4 (four) times daily.    . ferrous sulfate (FERROUSUL) 325 (65 FE) MG tablet Take 1 tablet (325 mg total) by mouth daily with breakfast. 90 tablet 1  . gabapentin (NEURONTIN) 100 MG capsule Take 1-2 capsules (100-200 mg total) by mouth 3 (three) times daily. 540 capsule 1  . guaiFENesin (MUCINEX) 600 MG 12 hr tablet Take 1 tablet (600 mg total) by mouth 2 (two) times daily as needed for cough. 30 tablet 0  . hydrocortisone (PROCTOZONE-HC) 2.5 % rectal cream Place 1 application rectally 2 (two) times daily. 30 g 1  . hydrOXYzine (ATARAX/VISTARIL) 10 MG tablet Take 1 tablet by mouth 2 (two) times daily.    Marland Kitchen levothyroxine (SYNTHROID, LEVOTHROID) 100 MCG tablet Take 1 tablet (100 mcg total) by mouth daily before breakfast. 90 tablet 1  . linaclotide (LINZESS) 290 MCG CAPS capsule Take 1 capsule (290 mcg total) by mouth daily before breakfast. 90 capsule 1  . magnesium oxide (MAG-OX) 400 MG tablet Take 400 mg by mouth daily with breakfast.    . montelukast (SINGULAIR) 10 MG tablet Take 1 tablet by mouth at bedtime.  11  . omeprazole (PRILOSEC) 20 MG capsule Take 1 capsule by mouth daily.    . pantoprazole (PROTONIX) 40 MG tablet Take 1 tablet (40 mg total) by mouth daily before breakfast. (Patient not taking: Reported on 12/26/2016) 90 tablet 1  . Polyethyl Glycol-Propyl Glycol (SYSTANE OP) Place 1 drop into both eyes daily as needed (dry eyes).    . polyethylene glycol powder (GLYCOLAX/MIRALAX) powder Take 17-34 g by mouth daily. 1700 g 5  . Pyridoxine HCl (VITAMIN B-6) 500 MG tablet Take 1 tablet by  mouth 2 (two) times daily.    . ranitidine (ZANTAC) 150 MG tablet Take 150 mg by mouth daily before supper.    Marland Kitchen tiZANidine (ZANAFLEX) 2 MG tablet Take 0.5-1 tablets (1-2 mg total) by mouth 2 (two) times daily as needed for muscle spasms. 180 tablet 1  . tolterodine (DETROL LA) 2 MG 24 hr capsule Take 1 capsule (2 mg total) daily by mouth. (Patient not taking: Reported on 12/26/2016) 30 capsule 11  . traZODone (DESYREL) 50 MG tablet Take 25-50 tablets by mouth daily.     . White Petrolatum-Mineral Oil (GENTEAL TEARS NIGHT-TIME) OINT Place 1 application into both eyes at bedtime.       Discharge Medications: Please see discharge summary for a list of discharge medications.  Relevant Imaging Results:  Relevant Lab Results:   Additional Information SSN:  [id number]  Joana Reamer, Elsa

## 2017-08-17 NOTE — Progress Notes (Signed)
LCSW called [facility] who recommended [facility] . The patient gave verbal consent to contact this potential group home provider.  [facility] in [place] [phone] and spoke to [facility] group home provider. They are agreeable to accept her.   Patient is agreeable to go to this group home. Patient reviewed her medication with pharmacist and Dr Prescott Gum in agreeance   Patient will dc to Group Home.  In review patient reports she left here with several scripts 2 days ago and gave them to [facility], she also disclosed she has medications for pick up at [facility].    Barrie Sigmund LCSW [phone]

## 2017-08-17 NOTE — ED Provider Notes (Signed)
[facility] Emergency Department Provider Note  ____________________________________________  Time seen: Approximately 6:52 PM  I have reviewed the triage vital signs and the nursing notes.   HISTORY  Chief Complaint social work consult  Level 5 Caveat: Portions of the History and Physical including HPI and review of systems are unable to be completely obtained due to patient being a poor historian    HPI Angela [name] is a 67 y.o. female with a history of neuropathy, PTSD, diverticulosis, COPD, homelessness who complains of having some mild wheezing that is persistent since seeing her pulmonologist a week ago.  She also would like social work help with getting herself into a family care home.  No other acute medical complaints.  No increasing cough fevers or chills.  She reports that she is currently homeless and slept in a [employer] last night.      Past Medical History:  Diagnosis Date  . Allergic rhinitis   . Anemia   . Blurred vision   . Depression   . Diverticulosis   . Endometriosis   . Falls   . GERD (gastroesophageal reflux disease)   . Hernia, hiatal   . Hypothyroidism   . IBS (irritable bowel syndrome)   . Malignant neoplasm of skin   . Migraine   . Neuropathy   . PTSD (post-traumatic stress disorder)   . Thyroid disease      Patient Active Problem List   Diagnosis Date Noted  . IBS (irritable bowel syndrome) 11/17/2016  . Bilateral carpal tunnel syndrome 11/17/2016  . Plantar fasciitis, bilateral 11/17/2016  . Chronic pain syndrome 11/17/2016  . Chronic hyponatremia 11/17/2016  . PTSD (post-traumatic stress disorder) 11/17/2016  . Hernia, hiatal 11/17/2016  . Vitamin D deficiency 11/17/2016  . Vitamin B12 deficiency 11/17/2016  . SIADH (syndrome of inappropriate ADH production) ([place]) 09/08/2016  . Cervical spondylosis with myelopathy 05/24/2016  . Post-concussion headache 05/24/2016  . MCI (mild cognitive impairment) with  memory loss 03/30/2016  . Gait abnormality 03/30/2016  . Chronic bipolar affective disorder ([place]) 01/24/2016  . Anemia, iron deficiency 06/24/2015  . Endometriosis 06/24/2015  . Essential hypertension 06/24/2015  . History of postmenopausal bleeding 05/22/2015  . History of TIA (transient ischemic attack) 04/20/2015  . Hypothyroidism 04/20/2015  . GERD (gastroesophageal reflux disease) 04/20/2015  . Cervical disc disorder with radiculopathy 03/25/2015  . Chronic neck pain 03/25/2015  . Pelvic pain in female 10/30/2014  . History of skin cancer 06/30/2014  . Chronic headaches 12/16/2013  . Neuropathy 12/16/2013     Past Surgical History:  Procedure Laterality Date  . APPENDECTOMY    . CHOLECYSTECTOMY    . CHOLECYSTECTOMY, LAPAROSCOPIC    . ESOPHAGOGASTRODUODENOSCOPY (EGD) WITH PROPOFOL N/A 03/29/2015   Procedure: ESOPHAGOGASTRODUODENOSCOPY (EGD) WITH PROPOFOL;  Surgeon: Josefine Class, MD;  [facility] ENDOSCOPY;  Service: Endoscopy;  Laterality: N/A;  . HERNIA REPAIR    . laproscopy    . TONSILLECTOMY    . uteral suspension       Prior to Admission medications   Medication Sig Start Date End Date Taking? Authorizing Provider  amLODipine (NORVASC) 5 MG tablet Take 1 tablet (5 mg total) by mouth 2 (two) times daily. 08/15/17  Yes Nance Pear, MD  ANORO ELLIPTA 62.5-25 MCG/INH AEPB Inhale 1 puff into the lungs daily. 08/15/17  Yes Nance Pear, MD  calcium carbonate (OS-CAL) 600 MG TABS tablet Take 1 tablet (600 mg total) by mouth 2 (two) times daily with a meal. Patient taking differently: Take  600 mg 3 (three) times daily by mouth.  10/28/16  Yes Shary Decamp, PA-C  cholecalciferol (VITAMIN D) 1000 units tablet Take 4,000 Units by mouth daily.   Yes [provider]  citalopram (CELEXA) 20 MG tablet Take 20 mg by mouth daily. 07/14/17  Yes [provider]  conjugated estrogens (PREMARIN) vaginal cream Place vaginally 3 (three) times a week. Use 1/2  gram 3-4 times weekly. 05/02/17  Yes Rubie Maid, MD  cycloSPORINE (RESTASIS) 0.05 % ophthalmic emulsion Place 1 drop into both eyes 2 (two) times daily. 01/12/17  Yes Karamalegos, Devonne Doughty, DO  diclofenac sodium (VOLTAREN) 1 % GEL Apply 2 g topically 4 (four) times daily. 06/13/17  Yes [provider]  ferrous sulfate (FERROUSUL) 325 (65 FE) MG tablet Take 1 tablet (325 mg total) by mouth daily with breakfast. 08/15/17  Yes Nance Pear, MD  gabapentin (NEURONTIN) 100 MG capsule Take 1-2 capsules (100-200 mg total) by mouth 3 (three) times daily. Patient taking differently: Take 100 mg by mouth 2 (two) times daily.  08/15/17  Yes Nance Pear, MD  guaiFENesin (MUCINEX) 600 MG 12 hr tablet Take 1 tablet (600 mg total) by mouth 2 (two) times daily as needed for cough. Patient taking differently: Take 600 mg by mouth daily.  12/26/16 12/26/17 Yes Lisa Roca, MD  hydrocortisone (PROCTOZONE-HC) 2.5 % rectal cream Place 1 application rectally 2 (two) times daily. 01/12/17  Yes Karamalegos, Devonne Doughty, DO  hydrOXYzine (ATARAX/VISTARIL) 10 MG tablet Take 1 tablet by mouth 2 (two) times daily as needed for anxiety.  06/19/17  Yes [provider]  KRILL OIL OMEGA-3 PO Take 350 mg by mouth daily.   Yes [provider]  levalbuterol (XOPENEX) 1.25 MG/3ML nebulizer solution Inhale 3 mLs into the lungs daily. 12/15/16 06/26/18 Yes [provider]  levothyroxine (SYNTHROID, LEVOTHROID) 100 MCG tablet Take 1 tablet (100 mcg total) by mouth daily before breakfast. 08/15/17  Yes Nance Pear, MD  linaclotide [facility]) 290 MCG CAPS capsule Take 1 capsule (290 mcg total) by mouth daily before breakfast. 08/15/17  Yes Nance Pear, MD  magnesium oxide (MAG-OX) 400 MG tablet Take 400 mg by mouth daily with breakfast.   Yes [provider]  montelukast (SINGULAIR) 10 MG tablet Take 1 tablet by mouth at bedtime. 08/10/17  Yes [provider]  omeprazole  (PRILOSEC) 20 MG capsule Take 1 capsule by mouth daily. 08/14/17  Yes [provider]  Polyethyl Glycol-Propyl Glycol (SYSTANE OP) Place 1 drop into both eyes daily as needed (dry eyes).   Yes [provider]  polyethylene glycol powder (GLYCOLAX/MIRALAX) powder Take 17-34 g by mouth daily. 11/17/16  Yes Karamalegos, Devonne Doughty, DO  Probiotic Product (PROBIOTIC PO) Take 1 tablet by mouth daily.   Yes [provider]  Pyridoxine HCl (VITAMIN B-6) 500 MG tablet Take 1 tablet by mouth 2 (two) times daily.   Yes [provider]  ranitidine (ZANTAC) 150 MG tablet Take 150 mg by mouth daily before supper.   Yes [provider]  traZODone (DESYREL) 50 MG tablet Take 50 mg by mouth at bedtime.   Yes [provider]  tiZANidine (ZANAFLEX) 2 MG tablet Take 0.5-1 tablets (1-2 mg total) by mouth 2 (two) times daily as needed for muscle spasms. 12/13/16   Karamalegos, Devonne Doughty, DO  tolterodine (DETROL LA) 2 MG 24 hr capsule Take 1 capsule (2 mg total) daily by mouth. Patient not taking: Reported on 12/26/2016 12/01/16   Rubie Maid, MD  Allergies Bee venom; Darvon [propoxyphene]; Dicyclomine; Other; Oxycodone-acetaminophen; Peanuts [peanut oil]; Penicillins; Sulfa antibiotics; Sulfacetamide sodium; Ultram [tramadol]; Amikacin; Aspirin; Doxycycline; Haloperidol; Hymenoptera venom preparations; Imipramine; Keflex [cephalexin]; Sulfur; Adhesive [tape]; Hydroxyzine; and Prednisone   Family History  Problem Relation Age of Onset  . Heart disease Father   . Cancer Mother        lung  . Urolithiasis Neg Hx   . Kidney disease Neg Hx   . Kidney cancer Neg Hx   . Prostate cancer Neg Hx     Social History Social History   Tobacco Use  . Smoking status: Former Smoker    Last attempt to quit: 11/17/1976    Years since quitting: 40.7  . Smokeless tobacco: Never Used  Substance Use Topics  . Alcohol use: No  . Drug use: No    Review of  Systems  Constitutional:   No fever or chills.  ENT:   No sore throat. No rhinorrhea. Cardiovascular:   No chest pain or syncope. Respiratory:   Positive intermittent shortness of breath without cough. Gastrointestinal:   Negative for abdominal pain, vomiting and diarrhea.  Musculoskeletal:   Negative for focal pain or swelling All other systems reviewed and are negative except as documented above in ROS and HPI.  ____________________________________________   PHYSICAL EXAM:  VITAL SIGNS: ED Triage Vitals  Enc Vitals Group     BP 08/17/17 1633 136/89     Pulse Rate 08/17/17 1633 66     Resp 08/17/17 1633 18     Temp 08/17/17 1633 98.9 F (37.2 C)     Temp Source 08/17/17 1633 Oral     SpO2 08/17/17 1633 100 %     Weight 08/17/17 1636 140 lb (63.5 kg)     Height 08/17/17 1636 5\' 3"  (1.6 m)     Head Circumference --      Peak Flow --      Pain Score 08/17/17 1636 0     Pain Loc --      Pain Edu? --      Excl. in [place]? --     Vital signs reviewed, nursing assessments reviewed.   Constitutional:   Alert and oriented. Non-toxic appearance. Eyes:   Conjunctivae are normal. EOMI. PERRL. ENT      Head:   Normocephalic and atraumatic.      Nose:   No congestion/rhinnorhea.       Mouth/Throat:   MMM, no pharyngeal erythema. No peritonsillar mass.       Neck:   No meningismus. Full ROM. Hematological/Lymphatic/Immunilogical:   No cervical lymphadenopathy. Cardiovascular:   RRR. Symmetric bilateral radial and DP pulses.  No murmurs. Cap refill less than 2 seconds. Respiratory:   Normal respiratory effort without tachypnea/retractions. Breath sounds are clear and equal bilaterally with normal breathing. No wheezes/rales/rhonchi.  There is slight inducible end expiratory wheezing with FEV1 maneuver or forceful coughing Gastrointestinal:   Soft and nontender. Non distended. There is no CVA tenderness.  No rebound, rigidity, or guarding. Musculoskeletal:   Normal range of motion in all  extremities. No joint effusions.  No lower extremity tenderness.  No edema. Neurologic:   Normal speech and language.  Motor grossly intact. No acute focal neurologic deficits are appreciated.  Skin:    Skin is warm, dry and intact. No rash noted.  No petechiae, purpura, or bullae.  ____________________________________________    LABS (pertinent positives/negatives) (all labs ordered are listed, but only abnormal results are displayed) Labs Reviewed - No data  to display ____________________________________________   EKG    ____________________________________________    RADIOLOGY  No results found.  ____________________________________________   PROCEDURES Procedures  ____________________________________________    CLINICAL IMPRESSION / ASSESSMENT AND PLAN / ED COURSE  Pertinent labs & imaging results that were available during my care of the patient were reviewed by me and considered in my medical decision making (see chart for details).    Well-appearing, not in distress, normal vital signs.  Slight bronchospasm on exam but otherwise not in respiratory distress, doubt pneumonia.  No evidence of hypoxia or concerns for hypercapnia.  Patient has medications updated today and from a visit 2 days ago.  Dr. Archie Balboa had written many prescriptions for her which are waiting for her [employer].  Discussed the situation with social work who has completed an FL 2 and facilitated the patient being admitted to residents with family care home.  No other acute medical needs at this time.  Seems that her medications and prescriptions have been managed, her living situation has been taken care of.  Suitable for discharge home at this time.      ____________________________________________   FINAL CLINICAL IMPRESSION(S) / ED DIAGNOSES    Final diagnoses:  Wheezing     ED Discharge Orders    None      Portions of this note were generated with dragon dictation software.  Dictation errors may occur despite best attempts at proofreading.    Carrie Mew, MD 08/17/17 [phone]

## 2017-08-17 NOTE — ED Notes (Signed)
Discussed with social worker Claudine pt's needs and gave her Pattricia Boss information.  SW consult placed. Informed Dr. Joni Fears as well.

## 2017-08-17 NOTE — Discharge Instructions (Signed)
You have prescriptions waiting for you at [facility].  You will need to pick these up and follow up with a primary care doctor this week for continued management of your usual medications.

## 2017-08-17 NOTE — ED Notes (Addendum)
FIRST NURSE NOTE: Pt arrived via POV.  Received call from Marjie Skiff from [facility] that the patient is having mental health issues and has a group home lined up, but no FL-2.  Per Cheryl-the group home [facility] [place] is willing to take the patient, but do not have an FL-2.  Pt denies any SI/HI on arrival.  Per driver, pt was agitated and irrate on the way to hospital, but is calm and cooperative at this time.  Pt brought in rolling walker and 5 bags of belongings.  Per Malachy Mood, pt has no alternative housing besides the group home at this time.  Marjie Skiff- [phone]

## 2017-08-17 NOTE — Progress Notes (Signed)
Family care home called and they are on their way to pick up patient. EDP and EDRN called to ensure DC paperwork in order  No further needs Angela Youngers LCSW

## 2017-08-17 NOTE — ED Notes (Signed)
Pt discharged via [facility].  Pt verbalized understanding of discharge instructions.  Pt denies pain.  Denies SI, HI, and hallucinations.

## 2017-08-18 NOTE — Progress Notes (Signed)
LCSW received a call from EDP at 6:45pm on August 2nd  It was confirmed that a pharmacist did take information from the patient to change current medication list. LCSW also explained that patient will need to follow up with her own community doctor on Monday to review current medications. This was relayed to Group home provider Melissa and [facility].  At [facility] on Aug 3rd the [facility] called again and reported some inconsistencies with medication amounts. LCSW reviewed medication concerns with ED Charge nurse and she requested LCSW write it out and have EDP write new prescriptions out.  Once task completed LCSW will fax over to [facility] [place]:  No further needs   Amea Mcphail LCSW [phone]

## 2017-08-22 NOTE — Telephone Encounter (Signed)
Unable to leave a message for the patient to call the office.

## 2017-08-23 ENCOUNTER — Other Ambulatory Visit (HOSPITAL_COMMUNITY): Payer: Self-pay | Admitting: Internal Medicine

## 2017-08-23 DIAGNOSIS — Z1231 Encounter for screening mammogram for malignant neoplasm of breast: Secondary | ICD-10-CM

## 2017-09-03 ENCOUNTER — Telehealth: Payer: Self-pay | Admitting: Obstetrics and Gynecology

## 2017-09-03 NOTE — Telephone Encounter (Signed)
Pt called no answer LM to call the office to speak more about her medication.

## 2017-09-03 NOTE — Telephone Encounter (Signed)
The patient called back with the information for the protein powder:  Ultrameal Advanced Protein, 2 scoops/day mixed with 10 oz. of water.  She is also asking to PICK UP her printed prescriptions tomorrow.  **PER PATIENT, DO NOT E-SCRIPT or PHONE them in**, please advise, thanks.

## 2017-09-03 NOTE — Telephone Encounter (Signed)
Pt called back asked for 3 medications to be refilled. Pt required the rx be printed and not faxed over because she wanted to pick them up.

## 2017-09-03 NOTE — Telephone Encounter (Signed)
The patient called stated that she would like a call back from her nurse this afternoon in regards to her needing to pick up prescriptions for Premarin cream, Iron supplements, and one other medication that she couldn't name or think of. The patient prefer's to be called today because she has a doctor appointment tomorrow morning. The patient also stated tat she would be able to pick up the prescriptions some time tomorrow or Wednesday when she is in town [place] No other information was disclosed. Please advise.

## 2017-09-03 NOTE — Telephone Encounter (Signed)
The patient called and stated that she is hoping to get a written prescription due to her not having the same pharmacy anymore, The patient would prefer to pick the prescriptions up. Thank you.

## 2017-09-03 NOTE — Telephone Encounter (Signed)
Please see another phone encounter.  

## 2017-09-04 ENCOUNTER — Other Ambulatory Visit
Admission: RE | Admit: 2017-09-04 | Discharge: 2017-09-04 | Disposition: A | Discharge status: Home / Self Care | Payer: Medicare PPO | Source: Ambulatory Visit | Attending: Internal Medicine | Admitting: Internal Medicine

## 2017-09-04 DIAGNOSIS — E039 Hypothyroidism, unspecified: Secondary | ICD-10-CM | POA: Diagnosis not present

## 2017-09-04 DIAGNOSIS — G8929 Other chronic pain: Secondary | ICD-10-CM | POA: Diagnosis not present

## 2017-09-04 DIAGNOSIS — K219 Gastro-esophageal reflux disease without esophagitis: Secondary | ICD-10-CM | POA: Insufficient documentation

## 2017-09-04 DIAGNOSIS — I10 Essential (primary) hypertension: Secondary | ICD-10-CM | POA: Diagnosis not present

## 2017-09-04 DIAGNOSIS — R7989 Other specified abnormal findings of blood chemistry: Secondary | ICD-10-CM | POA: Diagnosis not present

## 2017-09-04 DIAGNOSIS — J449 Chronic obstructive pulmonary disease, unspecified: Secondary | ICD-10-CM | POA: Insufficient documentation

## 2017-09-04 DIAGNOSIS — Z79899 Other long term (current) drug therapy: Secondary | ICD-10-CM | POA: Insufficient documentation

## 2017-09-04 LAB — HEPATIC FUNCTION PANEL
ALBUMIN: 4.3 g/dL (ref 3.5–5.0)
ALK PHOS: 78 U/L (ref 38–126)
ALT: 29 U/L (ref 0–44)
AST: 35 U/L (ref 15–41)
BILIRUBIN TOTAL: 0.6 mg/dL (ref 0.3–1.2)
Bilirubin, Direct: <0.1 mg/dL (ref 0.0–0.2)
Total Protein: 7.2 g/dL (ref 6.5–8.1)

## 2017-09-04 LAB — CBC WITH DIFFERENTIAL/PLATELET
BASOS ABS: 0.1 10*3/uL (ref 0–0.1)
BASOS PCT: 1 %
EOS ABS: 0.4 10*3/uL (ref 0–0.7)
Eosinophils Relative: 5 %
HEMATOCRIT: 38.2 % (ref 35.0–47.0)
HEMOGLOBIN: 13.1 g/dL (ref 12.0–16.0)
Lymphocytes Relative: 14 %
Lymphs Abs: 1.1 10*3/uL (ref 1.0–3.6)
MCH: 31.1 pg (ref 26.0–34.0)
MCHC: 34.2 g/dL (ref 32.0–36.0)
MCV: 91 fL (ref 80.0–100.0)
Monocytes Absolute: 0.7 10*3/uL (ref 0.2–0.9)
Monocytes Relative: 8 %
NEUTROS ABS: 6.1 10*3/uL (ref 1.4–6.5)
NEUTROS PCT: 72 %
Platelets: 316 10*3/uL (ref 150–440)
RBC: 4.2 MIL/uL (ref 3.80–5.20)
RDW: 14 % (ref 11.5–14.5)
WBC: 8.4 10*3/uL (ref 3.6–11.0)

## 2017-09-04 LAB — LIPID PANEL
CHOL/HDL RATIO: 3.2 ratio
CHOLESTEROL: 174 mg/dL (ref 0–200)
HDL: 54 mg/dL (ref >40)
LDL CALC: 87 mg/dL (ref 0–99)
Triglycerides: 167 mg/dL — ABNORMAL HIGH (ref <150)
VLDL: 33 mg/dL (ref 0–40)

## 2017-09-04 LAB — BASIC METABOLIC PANEL
Anion gap: 8 (ref 5–15)
BUN: 10 mg/dL (ref 8–23)
CHLORIDE: 101 mmol/L (ref 98–111)
CO2: 24 mmol/L (ref 22–32)
CREATININE: 0.64 mg/dL (ref 0.44–1.00)
Calcium: 9.2 mg/dL (ref 8.9–10.3)
GFR calc Af Amer: >60 mL/min (ref >60)
GFR calc non Af Amer: >60 mL/min (ref >60)
GLUCOSE: 93 mg/dL (ref 70–99)
Potassium: 3.7 mmol/L (ref 3.5–5.1)
SODIUM: 133 mmol/L — AB (ref 135–145)

## 2017-09-04 NOTE — Telephone Encounter (Signed)
Pt called no answer LM to call the office before she comes by the office to pick up her rx.

## 2017-09-05 ENCOUNTER — Encounter: Payer: Self-pay | Admitting: Surgery

## 2017-09-05 NOTE — Telephone Encounter (Signed)
Also I have mailed a letter for the patient to contact our office.

## 2017-09-05 NOTE — Telephone Encounter (Signed)
Left another message for the patient to call the office. °

## 2017-09-12 ENCOUNTER — Encounter (HOSPITAL_COMMUNITY): Payer: Self-pay | Admitting: Emergency Medicine

## 2017-09-12 ENCOUNTER — Emergency Department (HOSPITAL_COMMUNITY)
Admission: EM | Admit: 2017-09-12 | Discharge: 2017-09-12 | Disposition: A | Discharge status: Home / Self Care | Payer: Medicare PPO | Attending: Emergency Medicine | Admitting: Emergency Medicine

## 2017-09-12 ENCOUNTER — Emergency Department (HOSPITAL_COMMUNITY): Payer: Medicare PPO

## 2017-09-12 ENCOUNTER — Other Ambulatory Visit: Payer: Self-pay

## 2017-09-12 DIAGNOSIS — R1084 Generalized abdominal pain: Secondary | ICD-10-CM

## 2017-09-12 DIAGNOSIS — R451 Restlessness and agitation: Secondary | ICD-10-CM | POA: Insufficient documentation

## 2017-09-12 DIAGNOSIS — E039 Hypothyroidism, unspecified: Secondary | ICD-10-CM | POA: Diagnosis not present

## 2017-09-12 DIAGNOSIS — Z85828 Personal history of other malignant neoplasm of skin: Secondary | ICD-10-CM | POA: Insufficient documentation

## 2017-09-12 DIAGNOSIS — Z87891 Personal history of nicotine dependence: Secondary | ICD-10-CM | POA: Diagnosis not present

## 2017-09-12 DIAGNOSIS — Z91010 Allergy to peanuts: Secondary | ICD-10-CM | POA: Insufficient documentation

## 2017-09-12 DIAGNOSIS — R140 Abdominal distension (gaseous): Secondary | ICD-10-CM | POA: Insufficient documentation

## 2017-09-12 DIAGNOSIS — Z046 Encounter for general psychiatric examination, requested by authority: Secondary | ICD-10-CM | POA: Insufficient documentation

## 2017-09-12 DIAGNOSIS — F329 Major depressive disorder, single episode, unspecified: Secondary | ICD-10-CM | POA: Insufficient documentation

## 2017-09-12 DIAGNOSIS — Z09 Encounter for follow-up examination after completed treatment for conditions other than malignant neoplasm: Secondary | ICD-10-CM | POA: Insufficient documentation

## 2017-09-12 DIAGNOSIS — Z79899 Other long term (current) drug therapy: Secondary | ICD-10-CM | POA: Diagnosis not present

## 2017-09-12 DIAGNOSIS — I10 Essential (primary) hypertension: Secondary | ICD-10-CM | POA: Diagnosis not present

## 2017-09-12 DIAGNOSIS — Z789 Other specified health status: Secondary | ICD-10-CM

## 2017-09-12 DIAGNOSIS — R110 Nausea: Secondary | ICD-10-CM | POA: Insufficient documentation

## 2017-09-12 DIAGNOSIS — F419 Anxiety disorder, unspecified: Secondary | ICD-10-CM | POA: Insufficient documentation

## 2017-09-12 LAB — COMPREHENSIVE METABOLIC PANEL
ALK PHOS: 84 U/L (ref 38–126)
ALT: 28 U/L (ref 0–44)
ANION GAP: 9 (ref 5–15)
AST: 30 U/L (ref 15–41)
Albumin: 4.2 g/dL (ref 3.5–5.0)
BUN: 12 mg/dL (ref 8–23)
CALCIUM: 9.3 mg/dL (ref 8.9–10.3)
CO2: 24 mmol/L (ref 22–32)
CREATININE: 0.69 mg/dL (ref 0.44–1.00)
Chloride: 103 mmol/L (ref 98–111)
Glucose, Bld: 102 mg/dL — ABNORMAL HIGH (ref 70–99)
Potassium: 3.5 mmol/L (ref 3.5–5.1)
Sodium: 136 mmol/L (ref 135–145)
Total Bilirubin: 0.6 mg/dL (ref 0.3–1.2)
Total Protein: 7.4 g/dL (ref 6.5–8.1)

## 2017-09-12 LAB — URINALYSIS, ROUTINE W REFLEX MICROSCOPIC
Bilirubin Urine: NEGATIVE
Glucose, UA: NEGATIVE mg/dL
Hgb urine dipstick: NEGATIVE
Ketones, ur: NEGATIVE mg/dL
LEUKOCYTES UA: NEGATIVE
NITRITE: NEGATIVE
PROTEIN: NEGATIVE mg/dL
Specific Gravity, Urine: 1.004 — ABNORMAL LOW (ref 1.005–1.030)
pH: 7 (ref 5.0–8.0)

## 2017-09-12 LAB — CBC
HCT: 37.5 % (ref 36.0–46.0)
Hemoglobin: 12.8 g/dL (ref 12.0–15.0)
MCH: 30.6 pg (ref 26.0–34.0)
MCHC: 34.1 g/dL (ref 30.0–36.0)
MCV: 89.7 fL (ref 78.0–100.0)
PLATELETS: 317 10*3/uL (ref 150–400)
RBC: 4.18 MIL/uL (ref 3.87–5.11)
RDW: 13.6 % (ref 11.5–15.5)
WBC: 6.7 10*3/uL (ref 4.0–10.5)

## 2017-09-12 LAB — LIPASE, BLOOD: LIPASE: 35 U/L (ref 11–51)

## 2017-09-12 MED ORDER — PROMETHAZINE HCL 12.5 MG PO TABS
12.5000 mg | ORAL_TABLET | Freq: Once | ORAL | Status: AC
  Administered 2017-09-12: 12.5 mg via ORAL
  Filled 2017-09-12: qty 1

## 2017-09-12 MED ORDER — ACETAMINOPHEN 325 MG PO TABS: 650.0000 mg | ORAL_TABLET | Freq: Four times a day (QID) | ORAL | Status: DC | PRN

## 2017-09-12 MED ORDER — PROMETHAZINE HCL 25 MG PO TABS: 12.5000 mg | ORAL_TABLET | Freq: Four times a day (QID) | ORAL | 0 refills | Status: DC | PRN

## 2017-09-12 MED ORDER — ONDANSETRON 8 MG PO TBDP
8.0000 mg | ORAL_TABLET | Freq: Once | ORAL | Status: AC
  Administered 2017-09-12: 8 mg via ORAL
  Filled 2017-09-12: qty 1

## 2017-09-12 MED ORDER — PROMETHAZINE HCL 12.5 MG PO TABS: 25.0000 mg | ORAL_TABLET | Freq: Three times a day (TID) | ORAL | Status: DC | PRN

## 2017-09-12 MED ORDER — ACETAMINOPHEN 325 MG PO TABS: 650.0000 mg | ORAL_TABLET | ORAL | Status: DC | PRN

## 2017-09-12 NOTE — Consult Note (Signed)
Tele psych Assessment   Angela [name], 67 y.o., female patient seen via telepsych by TTS and this provider; chart reviewed and consulted with Dr. Dwyane Dee on 09/12/17.  On evaluation Angela [name] reports that she is having some trouble with the home she is staying in but denies suicidal/self-harm/homicidal ideation, psychosis, and paranoia.  States that she wants some assistant getting back on the medications she is suppose to be on and that she has 2 scheduled doctor appointment tomorrow During evaluation Angela [name] is alert/oriented x 4; calm/cooperative; and mood congruent with affect.  She does not appear to be responding to internal/external stimuli or delusional thoughts.  Patient denies suicidal/self-harm/homicidal ideation, psychosis, and paranoia.  Patient answered question appropriately.     For detailed note see TTS tele assessment note   Recommendations:  Outpatient psychiatric services.  Give resource and referral information  Disposition:  Psychiatrically cleared No evidence of imminent risk to self or others at present.   Patient does not meet criteria for psychiatric inpatient admission. Supportive therapy provided about ongoing stressors. Discussed crisis plan, support from social network, calling 911, coming to the Emergency Department, and calling Suicide Hotline.  Spoke with Dr.Mesner; informed of above recommendation and disposition   Shuvon B. Rankin, NP

## 2017-09-12 NOTE — ED Triage Notes (Signed)
Pt comes from hand in hand care facility in [facility]. Pt C/O nausea from a car ride from earlier today. Pt also C/O generalized abdominal pain.
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
## 2017-09-12 NOTE — Progress Notes (Addendum)
Disposition CSW reviewed patient's chart and called [facility] [phone]) and spoke to Owner, Marcelina Morel who confirmed that patient has been given a 30-day notice to vacate as was reported by patient..  However, it was just given last week and patient is not going to be forced to move this Friday as she reported.  [facility] Owner, reports that their Administrator, Rose Fillers, has contacted numerous [facility] but has been unsuccessful in finding a placement.  Facility does not intend to make patient leave until a placement is found.  Patient's Care Coordinator at [facility] is Darleene Cleaver [phone]).  CSW left a voice mail requesting a call back to discuss in case patient is sent back to the hospital.  She is currently recommended for discharge.  Areatha Keas. Judi Cong, MSW, [place] Disposition Clinical Social Work [phone] (cell) [phone] (office)

## 2017-09-12 NOTE — ED Notes (Signed)
TTS machine placed at bedside.   

## 2017-09-12 NOTE — ED Notes (Signed)
TTS in progress 

## 2017-09-13 ENCOUNTER — Ambulatory Visit (INDEPENDENT_AMBULATORY_CARE_PROVIDER_SITE_OTHER): Payer: Medicare PPO | Admitting: Podiatry

## 2017-09-13 ENCOUNTER — Encounter: Payer: Self-pay | Admitting: Podiatry

## 2017-09-13 DIAGNOSIS — M79676 Pain in unspecified toe(s): Secondary | ICD-10-CM

## 2017-09-13 DIAGNOSIS — M2010 Hallux valgus (acquired), unspecified foot: Secondary | ICD-10-CM

## 2017-09-13 DIAGNOSIS — B351 Tinea unguium: Secondary | ICD-10-CM

## 2017-09-13 NOTE — Progress Notes (Signed)
Complaint:  Visit Type: Patient returns to my office for continued preventative foot care services. Complaint: Patient states" my nails have grown long and thick and become painful to walk and wear shoes"  The patient presents for preventative foot care services. No changes to ROS  Podiatric Exam: Vascular: dorsalis pedis and posterior tibial pulses are palpable bilateral. Capillary return is immediate. Temperature gradient is WNL. Skin turgor WNL  Sensorium: Normal Semmes Weinstein monofilament test. Normal tactile sensation bilaterally. Nail Exam: Pt has thick disfigured discolored nails with subungual debris noted bilateral entire nail hallux through fifth toenails Ulcer Exam: There is no evidence of ulcer or pre-ulcerative changes or infection. Orthopedic Exam: Muscle tone and strength are WNL. No limitations in general ROM. No crepitus or effusions noted. Foot type and digits show no abnormalities. HAV  B/L with hammer toes 2-5  B/L Skin: No Porokeratosis. No infection or ulcers  Diagnosis:  Onychomycosis, , Pain in right toe, pain in left toes  Treatment & Plan Procedures and Treatment: Consent by patient was obtained for treatment procedures.   Debridement of mycotic and hypertrophic toenails, 1 through 5 bilateral and clearing of subungual debris. No ulceration, no infection noted. Patient was told to spray her foot as needed with alcohol.  She is to continue wearing compression socks.  Finally use ice packs at night as needed. Return Visit-Office Procedure: Patient instructed to return to the office for a follow up visit 3 months for continued evaluation and treatment.    Gardiner Barefoot DPM

## 2017-09-17 ENCOUNTER — Emergency Department
Admission: EM | Admit: 2017-09-17 | Discharge: 2017-09-19 | Disposition: A | Discharge status: Other Acute Inpatient Hospital | Payer: Medicare Other | Attending: Student in an Organized Health Care Education/Training Program | Admitting: Student in an Organized Health Care Education/Training Program

## 2017-09-17 DIAGNOSIS — Z79899 Other long term (current) drug therapy: Secondary | ICD-10-CM | POA: Diagnosis not present

## 2017-09-17 DIAGNOSIS — F3164 Bipolar disorder, current episode mixed, severe, with psychotic features: Secondary | ICD-10-CM

## 2017-09-17 DIAGNOSIS — E039 Hypothyroidism, unspecified: Secondary | ICD-10-CM | POA: Insufficient documentation

## 2017-09-17 DIAGNOSIS — I10 Essential (primary) hypertension: Secondary | ICD-10-CM | POA: Diagnosis present

## 2017-09-17 DIAGNOSIS — F3113 Bipolar disorder, current episode manic without psychotic features, severe: Secondary | ICD-10-CM | POA: Diagnosis not present

## 2017-09-17 DIAGNOSIS — Z91010 Allergy to peanuts: Secondary | ICD-10-CM | POA: Insufficient documentation

## 2017-09-17 DIAGNOSIS — R4689 Other symptoms and signs involving appearance and behavior: Secondary | ICD-10-CM

## 2017-09-17 DIAGNOSIS — F3110 Bipolar disorder, current episode manic without psychotic features, unspecified: Secondary | ICD-10-CM | POA: Insufficient documentation

## 2017-09-17 DIAGNOSIS — Z87891 Personal history of nicotine dependence: Secondary | ICD-10-CM | POA: Diagnosis not present

## 2017-09-17 DIAGNOSIS — F4310 Post-traumatic stress disorder, unspecified: Secondary | ICD-10-CM | POA: Diagnosis present

## 2017-09-17 DIAGNOSIS — R451 Restlessness and agitation: Secondary | ICD-10-CM | POA: Diagnosis not present

## 2017-09-17 LAB — COMPREHENSIVE METABOLIC PANEL
ALT: 57 U/L — ABNORMAL HIGH (ref 0–44)
AST: 62 U/L — AB (ref 15–41)
Albumin: 4.2 g/dL (ref 3.5–5.0)
Alkaline Phosphatase: 75 U/L (ref 38–126)
Anion gap: 6 (ref 5–15)
BUN: 11 mg/dL (ref 8–23)
CHLORIDE: 98 mmol/L (ref 98–111)
CO2: 27 mmol/L (ref 22–32)
Calcium: 9.2 mg/dL (ref 8.9–10.3)
Creatinine, Ser: 0.63 mg/dL (ref 0.44–1.00)
Glucose, Bld: 83 mg/dL (ref 70–99)
POTASSIUM: 3.8 mmol/L (ref 3.5–5.1)
Sodium: 131 mmol/L — ABNORMAL LOW (ref 135–145)
Total Bilirubin: 0.7 mg/dL (ref 0.3–1.2)
Total Protein: 7.1 g/dL (ref 6.5–8.1)

## 2017-09-17 LAB — URINE DRUG SCREEN, QUALITATIVE (ARMC ONLY)
AMPHETAMINES, UR SCREEN: NOT DETECTED
Barbiturates, Ur Screen: NOT DETECTED
COCAINE METABOLITE, UR ~~LOC~~: NOT DETECTED
Cannabinoid 50 Ng, Ur ~~LOC~~: NOT DETECTED
MDMA (ECSTASY) UR SCREEN: NOT DETECTED
METHADONE SCREEN, URINE: NOT DETECTED
OPIATE, UR SCREEN: NOT DETECTED
Phencyclidine (PCP) Ur S: NOT DETECTED
Tricyclic, Ur Screen: NOT DETECTED

## 2017-09-17 LAB — URINALYSIS, COMPLETE (UACMP) WITH MICROSCOPIC
BACTERIA UA: NONE SEEN
Bilirubin Urine: NEGATIVE
GLUCOSE, UA: NEGATIVE mg/dL
Hgb urine dipstick: NEGATIVE
Ketones, ur: NEGATIVE mg/dL
LEUKOCYTES UA: NEGATIVE
NITRITE: NEGATIVE
PH: 8 (ref 5.0–8.0)
Protein, ur: NEGATIVE mg/dL
SPECIFIC GRAVITY, URINE: 1.001 — AB (ref 1.005–1.030)
SQUAMOUS EPITHELIAL / LPF: NONE SEEN (ref 0–5)
WBC, UA: NONE SEEN WBC/hpf (ref 0–5)

## 2017-09-17 LAB — CBC WITH DIFFERENTIAL/PLATELET
BASOS ABS: 0.1 10*3/uL (ref 0–0.1)
Basophils Relative: 1 %
EOS PCT: 10 %
Eosinophils Absolute: 0.5 10*3/uL (ref 0–0.7)
HCT: 39.5 % (ref 35.0–47.0)
Hemoglobin: 13.8 g/dL (ref 12.0–16.0)
LYMPHS PCT: 25 %
Lymphs Abs: 1.4 10*3/uL (ref 1.0–3.6)
MCH: 31.7 pg (ref 26.0–34.0)
MCHC: 35 g/dL (ref 32.0–36.0)
MCV: 90.5 fL (ref 80.0–100.0)
MONO ABS: 0.7 10*3/uL (ref 0.2–0.9)
Monocytes Relative: 12 %
Neutro Abs: 2.9 10*3/uL (ref 1.4–6.5)
Neutrophils Relative %: 52 %
PLATELETS: 304 10*3/uL (ref 150–440)
RBC: 4.36 MIL/uL (ref 3.80–5.20)
RDW: 13.3 % (ref 11.5–14.5)
WBC: 5.6 10*3/uL (ref 3.6–11.0)

## 2017-09-17 MED ORDER — CYCLOSPORINE 0.05 % OP EMUL
1.0000 [drp] | Freq: Two times a day (BID) | OPHTHALMIC | Status: DC
  Administered 2017-09-17 – 2017-09-19 (×3): 1 [drp] via OPHTHALMIC
  Filled 2017-09-17 (×5): qty 1

## 2017-09-17 MED ORDER — AMLODIPINE BESYLATE 5 MG PO TABS
5.0000 mg | ORAL_TABLET | Freq: Two times a day (BID) | ORAL | Status: DC
  Administered 2017-09-17 – 2017-09-19 (×4): 5 mg via ORAL
  Filled 2017-09-17 (×4): qty 1

## 2017-09-17 MED ORDER — TIZANIDINE HCL 2 MG PO TABS
1.0000 mg | ORAL_TABLET | Freq: Two times a day (BID) | ORAL | Status: DC | PRN
  Filled 2017-09-17: qty 1

## 2017-09-17 MED ORDER — LEVOTHYROXINE SODIUM 75 MCG PO TABS
100.0000 ug | ORAL_TABLET | Freq: Every day | ORAL | Status: DC
  Administered 2017-09-18 – 2017-09-19 (×2): 100 ug via ORAL
  Filled 2017-09-17: qty 1
  Filled 2017-09-17: qty 2

## 2017-09-17 MED ORDER — LINACLOTIDE 290 MCG PO CAPS
290.0000 ug | ORAL_CAPSULE | Freq: Every day | ORAL | Status: DC
  Administered 2017-09-18 – 2017-09-19 (×2): 290 ug via ORAL
  Filled 2017-09-17 (×3): qty 1

## 2017-09-17 MED ORDER — LORAZEPAM 0.5 MG PO TABS: 0.5000 mg | ORAL_TABLET | Freq: Four times a day (QID) | ORAL | Status: DC | PRN

## 2017-09-17 MED ORDER — GABAPENTIN 100 MG PO CAPS
100.0000 mg | ORAL_CAPSULE | Freq: Two times a day (BID) | ORAL | Status: DC
  Administered 2017-09-17 – 2017-09-19 (×3): 100 mg via ORAL
  Filled 2017-09-17 (×5): qty 1

## 2017-09-17 MED ORDER — TRAZODONE HCL 50 MG PO TABS
50.0000 mg | ORAL_TABLET | Freq: Every day | ORAL | Status: DC
  Administered 2017-09-17 – 2017-09-18 (×2): 50 mg via ORAL
  Filled 2017-09-17 (×2): qty 1

## 2017-09-17 MED ORDER — HYDROXYZINE HCL 10 MG PO TABS
10.0000 mg | ORAL_TABLET | Freq: Two times a day (BID) | ORAL | Status: DC | PRN
  Filled 2017-09-17: qty 1

## 2017-09-17 NOTE — ED Notes (Signed)
Hourly rounding reveals patient sleeping in room. No complaints, stable, in no acute distress. Q15 minute rounds and monitoring via Rover and Officer to continue.  

## 2017-09-17 NOTE — ED Provider Notes (Signed)
[facility] Emergency Department Provider Note    First MD Initiated Contact with Patient 09/17/17 1955     (approximate)  I have reviewed the triage vital signs and the nursing notes.   HISTORY  Chief Complaint No chief complaint on file.    HPI Angela [name] is a 67 y.o. female with a history of bipolar disorder PTSD presents the ER from psychiatric group home due to agitation and aggressiveness towards staff and co-residents.  Patient states "they are withholding my medications ".  Patient then states "I remember you.  I tried to talk to you but you would not listen.  I wanted Ativan and you gave me  Haldol instead, which is illegal."  "  I am grieving and am here to speak with God."  "  I am unhappy with you and only went to speak with Dr. Weber Cooks, because he knows me."   Past Medical History:  Diagnosis Date  . Allergic rhinitis   . Anemia   . Blurred vision   . Depression   . Diverticulosis   . Endometriosis   . Falls   . GERD (gastroesophageal reflux disease)   . Hernia, hiatal   . Hypothyroidism   . IBS (irritable bowel syndrome)   . Malignant neoplasm of skin   . Migraine   . Neuropathy   . PTSD (post-traumatic stress disorder)   . Thyroid disease    Family History  Problem Relation Age of Onset  . Heart disease Father   . Cancer Mother        lung  . Urolithiasis Neg Hx   . Kidney disease Neg Hx   . Kidney cancer Neg Hx   . Prostate cancer Neg Hx    Past Surgical History:  Procedure Laterality Date  . APPENDECTOMY    . CHOLECYSTECTOMY    . CHOLECYSTECTOMY, LAPAROSCOPIC    . ESOPHAGOGASTRODUODENOSCOPY (EGD) WITH PROPOFOL N/A 03/29/2015   Procedure: ESOPHAGOGASTRODUODENOSCOPY (EGD) WITH PROPOFOL;  Surgeon: Josefine Class, MD;  Location: [facility] ENDOSCOPY;  Service: Endoscopy;  Laterality: N/A;  . HERNIA REPAIR    . laproscopy    . TONSILLECTOMY    . uteral suspension     Patient Active Problem List   Diagnosis Date  Noted  . IBS (irritable bowel syndrome) 11/17/2016  . Bilateral carpal tunnel syndrome 11/17/2016  . Plantar fasciitis, bilateral 11/17/2016  . Chronic pain syndrome 11/17/2016  . Chronic hyponatremia 11/17/2016  . PTSD (post-traumatic stress disorder) 11/17/2016  . Hernia, hiatal 11/17/2016  . Vitamin D deficiency 11/17/2016  . Vitamin B12 deficiency 11/17/2016  . SIADH (syndrome of inappropriate ADH production) ([place]) 09/08/2016  . Cervical spondylosis with myelopathy 05/24/2016  . Post-concussion headache 05/24/2016  . MCI (mild cognitive impairment) with memory loss 03/30/2016  . Gait abnormality 03/30/2016  . Chronic bipolar affective disorder ([place]) 01/24/2016  . Anemia, iron deficiency 06/24/2015  . Endometriosis 06/24/2015  . Essential hypertension 06/24/2015  . History of postmenopausal bleeding 05/22/2015  . History of TIA (transient ischemic attack) 04/20/2015  . Hypothyroidism 04/20/2015  . GERD (gastroesophageal reflux disease) 04/20/2015  . Cervical disc disorder with radiculopathy 03/25/2015  . Chronic neck pain 03/25/2015  . Pelvic pain in female 10/30/2014  . History of skin cancer 06/30/2014  . Chronic headaches 12/16/2013  . Neuropathy 12/16/2013      Prior to Admission medications   Medication Sig Start Date End Date Taking? Authorizing Provider  amLODipine (NORVASC) 5 MG tablet Take 1 tablet (5 mg  total) by mouth 2 (two) times daily. 08/15/17   Nance Pear, MD  ANORO ELLIPTA 62.5-25 MCG/INH AEPB Inhale 1 puff into the lungs daily. 08/15/17   Nance Pear, MD  calcium carbonate (OS-CAL) 600 MG TABS tablet Take 1 tablet (600 mg total) by mouth 2 (two) times daily with a meal. Patient taking differently: Take 600 mg 3 (three) times daily by mouth.  10/28/16   Shary Decamp, PA-C  cholecalciferol (VITAMIN D) 1000 units tablet Take 4,000 Units by mouth daily.    [provider]  citalopram (CELEXA) 20 MG tablet Take 20 mg by mouth daily. 07/14/17    [provider]  conjugated estrogens (PREMARIN) vaginal cream Place vaginally 3 (three) times a week. Use 1/2 gram 3-4 times weekly. 05/02/17   Rubie Maid, MD  cycloSPORINE (RESTASIS) 0.05 % ophthalmic emulsion Place 1 drop into both eyes 2 (two) times daily. 01/12/17   Karamalegos, Devonne Doughty, DO  diclofenac sodium (VOLTAREN) 1 % GEL Apply 2 g topically 4 (four) times daily. 06/13/17   [provider]  ferrous sulfate (FERROUSUL) 325 (65 FE) MG tablet Take 1 tablet (325 mg total) by mouth daily with breakfast. 08/15/17   Nance Pear, MD  gabapentin (NEURONTIN) 100 MG capsule Take 1-2 capsules (100-200 mg total) by mouth 3 (three) times daily. Patient taking differently: Take 100 mg by mouth 2 (two) times daily.  08/15/17   Nance Pear, MD  guaiFENesin (MUCINEX) 600 MG 12 hr tablet Take 1 tablet (600 mg total) by mouth 2 (two) times daily as needed for cough. Patient taking differently: Take 600 mg by mouth daily.  12/26/16 12/26/17  Lisa Roca, MD  hydrocortisone (PROCTOZONE-HC) 2.5 % rectal cream Place 1 application rectally 2 (two) times daily. 01/12/17   Karamalegos, Devonne Doughty, DO  hydrOXYzine (ATARAX/VISTARIL) 10 MG tablet Take 1 tablet by mouth 2 (two) times daily as needed for anxiety.  06/19/17   [provider]  KRILL OIL OMEGA-3 PO Take 350 mg by mouth daily.    [provider]  levalbuterol (XOPENEX) 1.25 MG/3ML nebulizer solution Inhale 3 mLs into the lungs daily. 12/15/16 06/26/18  [provider]  levothyroxine (SYNTHROID, LEVOTHROID) 100 MCG tablet Take 1 tablet (100 mcg total) by mouth daily before breakfast. 08/15/17   Nance Pear, MD  linaclotide [facility]) 290 MCG CAPS capsule Take 1 capsule (290 mcg total) by mouth daily before breakfast. 08/15/17   Nance Pear, MD  magnesium oxide (MAG-OX) 400 MG tablet Take 400 mg by mouth daily with breakfast.    [provider]  montelukast (SINGULAIR) 10 MG tablet Take  1 tablet by mouth at bedtime. 08/10/17   [provider]  omeprazole (PRILOSEC) 20 MG capsule Take 1 capsule by mouth daily. 08/14/17   [provider]  Polyethyl Glycol-Propyl Glycol (SYSTANE OP) Place 1 drop into both eyes daily as needed (dry eyes).    [provider]  polyethylene glycol powder (GLYCOLAX/MIRALAX) powder Take 17-34 g by mouth daily. 11/17/16   Karamalegos, Devonne Doughty, DO  Probiotic Product (PROBIOTIC PO) Take 1 tablet by mouth daily.    [provider]  promethazine (PHENERGAN) 25 MG tablet Take 0.5 tablets (12.5 mg total) by mouth every 6 (six) hours as needed for nausea or vomiting. 09/12/17   Mesner, Corene Cornea, MD  Pyridoxine HCl (VITAMIN B-6) 500 MG tablet Take 1 tablet by mouth 2 (two) times daily.    [provider]  ranitidine (ZANTAC) 150 MG tablet Take 150 mg by mouth  daily before supper.    [provider]  tiZANidine (ZANAFLEX) 2 MG tablet Take 0.5-1 tablets (1-2 mg total) by mouth 2 (two) times daily as needed for muscle spasms. 12/13/16   Karamalegos, Devonne Doughty, DO  tolterodine (DETROL LA) 2 MG 24 hr capsule Take 1 capsule (2 mg total) daily by mouth. Patient not taking: Reported on 12/26/2016 12/01/16   Rubie Maid, MD  traZODone (DESYREL) 50 MG tablet Take 50 mg by mouth at bedtime.    [provider]    Allergies Bee venom; Darvon [propoxyphene]; Dicyclomine; Other; Oxycodone-acetaminophen; Peanuts [peanut oil]; Penicillins; Sulfa antibiotics; Sulfacetamide sodium; Ultram [tramadol]; Amikacin; Aspirin; Doxycycline; Haloperidol; Hymenoptera venom preparations; Imipramine; Keflex [cephalexin]; Sulfur; Adhesive [tape]; Hydroxyzine; and Prednisone    Social History Social History   Tobacco Use  . Smoking status: Former Smoker    Last attempt to quit: 11/17/1976    Years since quitting: 40.8  . Smokeless tobacco: Never Used  Substance Use Topics  . Alcohol use: No  . Drug use: No    Review of  Systems Patient denies headaches, rhinorrhea, blurry vision, numbness, shortness of breath, chest pain, edema, cough, abdominal pain, nausea, vomiting, diarrhea, dysuria, fevers, rashes or hallucinations unless otherwise stated above in HPI. ____________________________________________   PHYSICAL EXAM:  VITAL SIGNS: Vitals:   09/17/17 2008  BP: (!) 156/100  Pulse: 61  Resp: 16  Temp: 98.8 F (37.1 C)  SpO2: 98%    Constitutional: Alert and oriented. Very anxious appearing Eyes: Conjunctivae are normal.  Head: Atraumatic. Nose: No congestion/rhinnorhea. Mouth/Throat: Mucous membranes are moist.   Neck: No stridor. Painless ROM.  Cardiovascular: Normal rate, regular rhythm. Grossly normal heart sounds.  Good peripheral circulation. Respiratory: Normal respiratory effort.  No retractions. Lungs CTAB. Gastrointestinal: Soft and nontender. No distention. No abdominal bruits. No CVA tenderness. Genitourinary:  Musculoskeletal: No lower extremity tenderness nor edema.  No joint effusions. Neurologic:  Normal speech and language. No gross focal neurologic deficits are appreciated. No facial droop Skin:  Skin is warm, dry and intact. No rash noted. Psychiatric:anxious appearing, disheveled appearing, grandiose and hyper-religious, denies SI or HI. ____________________________________________   LABS (all labs ordered are listed, but only abnormal results are displayed)  Results for orders placed or performed during the hospital encounter of 09/17/17 (from the past 24 hour(s))  CBC with Differential/Platelet     Status: None   Collection Time: 09/17/17  8:30 PM  Result Value Ref Range   WBC 5.6 3.6 - 11.0 K/uL   RBC 4.36 3.80 - 5.20 MIL/uL   Hemoglobin 13.8 12.0 - 16.0 g/dL   HCT 39.5 35.0 - 47.0 %   MCV 90.5 80.0 - 100.0 fL   MCH 31.7 26.0 - 34.0 pg   MCHC 35.0 32.0 - 36.0 g/dL   RDW 13.3 11.5 - 14.5 %   Platelets 304 150 - 440 K/uL   Neutrophils Relative % 52 %   Neutro Abs  2.9 1.4 - 6.5 K/uL   Lymphocytes Relative 25 %   Lymphs Abs 1.4 1.0 - 3.6 K/uL   Monocytes Relative 12 %   Monocytes Absolute 0.7 0.2 - 0.9 K/uL   Eosinophils Relative 10 %   Eosinophils Absolute 0.5 0 - 0.7 K/uL   Basophils Relative 1 %   Basophils Absolute 0.1 0 - 0.1 K/uL   ____________________________________________ ____________________________________________  RADIOLOGY   ____________________________________________   PROCEDURES  Procedure(s) performed:  Procedures    Critical Care performed: no ____________________________________________   INITIAL IMPRESSION / ASSESSMENT  AND PLAN / ED COURSE  Pertinent labs & imaging results that were available during my care of the patient were reviewed by me and considered in my medical decision making (see chart for details).   DDX: Psychosis, delirium, medication effect, noncompliance, polysubstance abuse, Si, Hi, depression   Jewelene Mairena is a 67 y.o. who presents to the ED with for evaluation of agitation and aggresion.  Patient has psych history of bipolar disorder and ptsd.  Laboratory testing was ordered to evaluation for underlying electrolyte derangement or signs of underlying organic pathology to explain today's presentation.  Based on history and physical and laboratory evaluation, it appears that the patient's presentation is 2/2 underlying psychiatric disorder and will require further evaluation and management by inpatient psychiatry.  Patient was made an IVC due to agitation and aggressive behavior..  Disposition pending psychiatric evaluation.       As part of my medical decision making, I reviewed the following data within the [place] notes reviewed and incorporated, Labs reviewed, notes from prior ED visits.   ____________________________________________   FINAL CLINICAL IMPRESSION(S) / ED DIAGNOSES  Final diagnoses:  Aggression  Agitation      NEW MEDICATIONS STARTED  DURING THIS VISIT:  New Prescriptions   No medications on file     Note:  This document was prepared using Dragon voice recognition software and may include unintentional dictation errors.    Merlyn Lot, MD 09/17/17 [phone]

## 2017-09-17 NOTE — ED Notes (Signed)
Angela [name] arrived to the ED by way of EMS.  She refused to participate with the TTS Assessment. She stated ,  "I don't want to talk to you, I don't need your help."  Angela [name] became upset and agitated, stating that she was not going to discuss what is going on, because no one is going to do anything.  She wants her medication and will speak with Dr. Weber Cooks in the morning.  Angela [name] began to raise her voice at the TTS to express her discontent with the hospital and the staff, stating that she would only speak with Dr. Weber Cooks as she is 67 years old and is not psychotic and will not be told that she is by "Some young white doctor who knows nothing".  TTS will follow up to gather additional information when patient is less agitated.

## 2017-09-17 NOTE — ED Notes (Signed)
Hourly rounding reveals patient in room. No complaints, stable, in no acute distress. Q15 minute rounds and monitoring via Rover and Officer to continue.   

## 2017-09-17 NOTE — ED Triage Notes (Signed)
Patient arrives [facility] from [facility] after they were called for patient being agitated and depressed. Patient states that the staff at the care facility "don't care" and ignore her needs. She further states that the staff are withholding her medications.
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
## 2017-09-17 NOTE — ED Notes (Signed)
Pts. Personal items inventoried and listed on inventory sheet placed on chart.

## 2017-09-18 DIAGNOSIS — R451 Restlessness and agitation: Secondary | ICD-10-CM

## 2017-09-18 DIAGNOSIS — F3113 Bipolar disorder, current episode manic without psychotic features, severe: Secondary | ICD-10-CM

## 2017-09-18 DIAGNOSIS — F3164 Bipolar disorder, current episode mixed, severe, with psychotic features: Secondary | ICD-10-CM

## 2017-09-18 HISTORY — DX: Restlessness and agitation: R45.1

## 2017-09-18 MED ORDER — LURASIDONE HCL 40 MG PO TABS
40.0000 mg | ORAL_TABLET | Freq: Every day | ORAL | Status: DC
  Filled 2017-09-18: qty 1

## 2017-09-18 NOTE — ED Notes (Signed)
IVC/Consult ordered/pending 

## 2017-09-18 NOTE — ED Notes (Signed)
Hourly rounding reveals patient in room. No complaints, stable, in no acute distress. Q15 minute rounds and monitoring via Security Cameras to continue. 

## 2017-09-18 NOTE — ED Notes (Signed)
Hourly rounding reveals patient sleeping in room. No complaints, stable, in no acute distress. Q15 minute rounds and monitoring via Security Cameras to continue. 

## 2017-09-18 NOTE — ED Notes (Signed)
Hourly rounding reveals patient sleeping in room. No complaints, stable, in no acute distress. Q15 minute rounds and monitoring via Rover and Officer to continue.  

## 2017-09-18 NOTE — ED Notes (Signed)
IVC PAPER  RESCINDED  PER  DR  CLAPACS  PT  VOL  PENDING  PLACEMENT
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
## 2017-09-18 NOTE — ED Notes (Signed)
Report to include Situation, Background, Assessment, and Recommendations received from Amy B. RN. Patient alert and oriented, warm and dry, in no acute distress. Patient denies SI, HI, AVH and pain. Patient made aware of Q15 minute rounds and security cameras for their safety. Patient instructed to come to me with needs or concerns. 

## 2017-09-18 NOTE — ED Notes (Signed)
Gave pt new scrub pants and sandwich tray.
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
## 2017-09-18 NOTE — ED Notes (Signed)
Hourly rounding reveals patient in room. No complaints, stable, in no acute distress. Q15 minute rounds and monitoring via Rover and Officer to continue.   

## 2017-09-18 NOTE — ED Notes (Addendum)
Pt unhappy with her dinner tray. Also complaining of a rash of her abdomen. "I didn't have this rash until last night."    EDP notified of rash.   Maintained on 15 minute checks and observation by security camera for safety.

## 2017-09-18 NOTE — ED Notes (Signed)
Pt is wearing her sneakers per Dr. Weber Cooks. Without her shoes, the patient stated she would not be able to ambulate to the restroom. Pt denies SI/HI.

## 2017-09-18 NOTE — ED Notes (Signed)
Pt upset she has not seen a Education officer, museum since arriving to the hospital. RN told patient she would ask Dr Loyal Gambler to put in a social work consult.   Maintained on 15 minute checks and observation by security camera for safety.

## 2017-09-18 NOTE — ED Notes (Signed)
Pt is taking a shower.   Maintained on 15 minute checks and observation by security camera for safety.
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
## 2017-09-18 NOTE — ED Notes (Signed)
Pt given another blanket.

## 2017-09-18 NOTE — BH Assessment (Signed)
Patient Angela [name], [facility] ([phone]) came to the ER to deliver papers indicating she is getting an immediate discharge from their facility. They also stated they are going to press assault charges. Writer informed Psych MD (Dr. Weber Cooks).

## 2017-09-19 ENCOUNTER — Other Ambulatory Visit: Payer: Self-pay

## 2017-09-19 ENCOUNTER — Inpatient Hospital Stay
Admission: AD | Admit: 2017-09-19 | Discharge: 2017-11-06 | DRG: 885 | Disposition: A | Discharge status: Home / Self Care | Payer: Medicare Other | Attending: Psychiatry | Admitting: Psychiatry

## 2017-09-19 DIAGNOSIS — R451 Restlessness and agitation: Secondary | ICD-10-CM | POA: Diagnosis present

## 2017-09-19 DIAGNOSIS — E860 Dehydration: Secondary | ICD-10-CM | POA: Diagnosis present

## 2017-09-19 DIAGNOSIS — Z91410 Personal history of adult physical and sexual abuse: Secondary | ICD-10-CM

## 2017-09-19 DIAGNOSIS — Z590 Homelessness: Secondary | ICD-10-CM | POA: Diagnosis not present

## 2017-09-19 DIAGNOSIS — B373 Candidiasis of vulva and vagina: Secondary | ICD-10-CM | POA: Diagnosis present

## 2017-09-19 DIAGNOSIS — Z79899 Other long term (current) drug therapy: Secondary | ICD-10-CM

## 2017-09-19 DIAGNOSIS — Z8249 Family history of ischemic heart disease and other diseases of the circulatory system: Secondary | ICD-10-CM | POA: Diagnosis not present

## 2017-09-19 DIAGNOSIS — Z9119 Patient's noncompliance with other medical treatment and regimen: Secondary | ICD-10-CM | POA: Diagnosis not present

## 2017-09-19 DIAGNOSIS — R21 Rash and other nonspecific skin eruption: Secondary | ICD-10-CM | POA: Diagnosis present

## 2017-09-19 DIAGNOSIS — D508 Other iron deficiency anemias: Secondary | ICD-10-CM

## 2017-09-19 DIAGNOSIS — F312 Bipolar disorder, current episode manic severe with psychotic features: Secondary | ICD-10-CM

## 2017-09-19 DIAGNOSIS — Z79890 Hormone replacement therapy (postmenopausal): Secondary | ICD-10-CM

## 2017-09-19 DIAGNOSIS — K219 Gastro-esophageal reflux disease without esophagitis: Secondary | ICD-10-CM | POA: Diagnosis present

## 2017-09-19 DIAGNOSIS — F410 Panic disorder [episodic paroxysmal anxiety] without agoraphobia: Secondary | ICD-10-CM | POA: Diagnosis present

## 2017-09-19 DIAGNOSIS — R079 Chest pain, unspecified: Secondary | ICD-10-CM | POA: Diagnosis not present

## 2017-09-19 DIAGNOSIS — G4089 Other seizures: Secondary | ICD-10-CM | POA: Diagnosis present

## 2017-09-19 DIAGNOSIS — E559 Vitamin D deficiency, unspecified: Secondary | ICD-10-CM | POA: Diagnosis present

## 2017-09-19 DIAGNOSIS — Z791 Long term (current) use of non-steroidal anti-inflammatories (NSAID): Secondary | ICD-10-CM | POA: Diagnosis not present

## 2017-09-19 DIAGNOSIS — M722 Plantar fascial fibromatosis: Secondary | ICD-10-CM | POA: Diagnosis present

## 2017-09-19 DIAGNOSIS — K5909 Other constipation: Secondary | ICD-10-CM | POA: Diagnosis present

## 2017-09-19 DIAGNOSIS — Z85828 Personal history of other malignant neoplasm of skin: Secondary | ICD-10-CM | POA: Diagnosis not present

## 2017-09-19 DIAGNOSIS — F609 Personality disorder, unspecified: Secondary | ICD-10-CM | POA: Diagnosis present

## 2017-09-19 DIAGNOSIS — R45851 Suicidal ideations: Secondary | ICD-10-CM | POA: Diagnosis present

## 2017-09-19 DIAGNOSIS — Z87891 Personal history of nicotine dependence: Secondary | ICD-10-CM

## 2017-09-19 DIAGNOSIS — E039 Hypothyroidism, unspecified: Secondary | ICD-10-CM | POA: Diagnosis present

## 2017-09-19 DIAGNOSIS — Z91010 Allergy to peanuts: Secondary | ICD-10-CM | POA: Diagnosis not present

## 2017-09-19 DIAGNOSIS — Z7952 Long term (current) use of systemic steroids: Secondary | ICD-10-CM | POA: Diagnosis not present

## 2017-09-19 DIAGNOSIS — R32 Unspecified urinary incontinence: Secondary | ICD-10-CM | POA: Diagnosis present

## 2017-09-19 DIAGNOSIS — I10 Essential (primary) hypertension: Secondary | ICD-10-CM | POA: Diagnosis present

## 2017-09-19 DIAGNOSIS — Z818 Family history of other mental and behavioral disorders: Secondary | ICD-10-CM

## 2017-09-19 DIAGNOSIS — F3164 Bipolar disorder, current episode mixed, severe, with psychotic features: Principal | ICD-10-CM | POA: Diagnosis present

## 2017-09-19 DIAGNOSIS — G4700 Insomnia, unspecified: Secondary | ICD-10-CM | POA: Diagnosis present

## 2017-09-19 DIAGNOSIS — F4310 Post-traumatic stress disorder, unspecified: Secondary | ICD-10-CM | POA: Diagnosis present

## 2017-09-19 DIAGNOSIS — J45909 Unspecified asthma, uncomplicated: Secondary | ICD-10-CM | POA: Diagnosis present

## 2017-09-19 DIAGNOSIS — G894 Chronic pain syndrome: Secondary | ICD-10-CM | POA: Diagnosis present

## 2017-09-19 DIAGNOSIS — J449 Chronic obstructive pulmonary disease, unspecified: Secondary | ICD-10-CM | POA: Diagnosis not present

## 2017-09-19 MED ORDER — MAGNESIUM HYDROXIDE 400 MG/5ML PO SUSP
30.0000 mL | Freq: Every day | ORAL | Status: DC | PRN
  Administered 2017-09-22 – 2017-10-05 (×3): 30 mL via ORAL
  Filled 2017-09-19 (×3): qty 30

## 2017-09-19 MED ORDER — HYDROXYZINE HCL 10 MG PO TABS: 10.0000 mg | ORAL_TABLET | Freq: Two times a day (BID) | ORAL | Status: DC | PRN

## 2017-09-19 MED ORDER — GABAPENTIN 100 MG PO CAPS
100.0000 mg | ORAL_CAPSULE | Freq: Two times a day (BID) | ORAL | Status: DC
  Administered 2017-09-19 – 2017-11-06 (×86): 100 mg via ORAL
  Filled 2017-09-19 (×86): qty 1

## 2017-09-19 MED ORDER — LEVOTHYROXINE SODIUM 100 MCG PO TABS
100.0000 ug | ORAL_TABLET | Freq: Every day | ORAL | Status: DC
  Administered 2017-09-20 – 2017-09-25 (×6): 100 ug via ORAL
  Filled 2017-09-19 (×6): qty 1

## 2017-09-19 MED ORDER — CYCLOSPORINE 0.05 % OP EMUL
1.0000 [drp] | Freq: Two times a day (BID) | OPHTHALMIC | Status: DC
  Administered 2017-09-20 – 2017-10-17 (×52): 1 [drp] via OPHTHALMIC
  Filled 2017-09-19 (×61): qty 1

## 2017-09-19 MED ORDER — ENSURE ENLIVE PO LIQD
237.0000 mL | Freq: Three times a day (TID) | ORAL | Status: DC
  Administered 2017-09-19 – 2017-09-23 (×11): 237 mL via ORAL

## 2017-09-19 MED ORDER — TRAZODONE HCL 100 MG PO TABS: 100.0000 mg | ORAL_TABLET | Freq: Every evening | ORAL | Status: DC | PRN

## 2017-09-19 MED ORDER — FLUVOXAMINE MALEATE 50 MG PO TABS
50.0000 mg | ORAL_TABLET | Freq: Every day | ORAL | Status: DC
  Administered 2017-09-19: 50 mg via ORAL
  Filled 2017-09-19: qty 1

## 2017-09-19 MED ORDER — AMLODIPINE BESYLATE 5 MG PO TABS
5.0000 mg | ORAL_TABLET | Freq: Two times a day (BID) | ORAL | Status: DC
  Administered 2017-09-19 – 2017-10-11 (×18): 5 mg via ORAL
  Filled 2017-09-19 (×38): qty 1

## 2017-09-19 MED ORDER — ALUM & MAG HYDROXIDE-SIMETH 200-200-20 MG/5ML PO SUSP
30.0000 mL | ORAL | Status: DC | PRN
  Administered 2017-09-24 – 2017-11-04 (×8): 30 mL via ORAL
  Filled 2017-09-19 (×8): qty 30

## 2017-09-19 MED ORDER — HYDROXYZINE HCL 50 MG PO TABS
50.0000 mg | ORAL_TABLET | Freq: Three times a day (TID) | ORAL | Status: DC | PRN
  Administered 2017-09-28: 50 mg via ORAL
  Filled 2017-09-19 (×2): qty 1

## 2017-09-19 MED ORDER — TRAZODONE HCL 50 MG PO TABS
50.0000 mg | ORAL_TABLET | Freq: Every day | ORAL | Status: DC
  Administered 2017-09-19: 50 mg via ORAL
  Filled 2017-09-19: qty 1

## 2017-09-19 MED ORDER — TIZANIDINE HCL 2 MG PO TABS
1.0000 mg | ORAL_TABLET | Freq: Two times a day (BID) | ORAL | Status: DC | PRN
  Filled 2017-09-19: qty 1

## 2017-09-19 MED ORDER — LURASIDONE HCL 40 MG PO TABS
40.0000 mg | ORAL_TABLET | Freq: Every day | ORAL | Status: DC
  Filled 2017-09-19 (×2): qty 1

## 2017-09-19 MED ORDER — ACETAMINOPHEN 325 MG PO TABS
650.0000 mg | ORAL_TABLET | Freq: Four times a day (QID) | ORAL | Status: DC | PRN
  Administered 2017-09-22 – 2017-11-05 (×36): 650 mg via ORAL
  Filled 2017-09-19 (×36): qty 2

## 2017-09-19 MED ORDER — LINACLOTIDE 290 MCG PO CAPS
290.0000 ug | ORAL_CAPSULE | Freq: Every day | ORAL | Status: DC
  Administered 2017-09-20 – 2017-09-25 (×5): 290 ug via ORAL
  Filled 2017-09-19 (×7): qty 1

## 2017-09-19 MED ORDER — LORAZEPAM 0.5 MG PO TABS
0.5000 mg | ORAL_TABLET | Freq: Four times a day (QID) | ORAL | Status: DC | PRN
  Administered 2017-09-19 – 2017-11-06 (×22): 0.5 mg via ORAL
  Filled 2017-09-19 (×24): qty 1

## 2017-09-19 NOTE — Tx Team (Signed)
Initial Treatment Plan 09/19/2017 4:33 PM [name] QWB:[id number]    PATIENT STRESSORS: Pain Fear Trauma Homeless   PATIENT STRENGTHS: Other: Pain   PATIENT IDENTIFIED PROBLEMS: Pain   Fear   Trauma  Homeless               DISCHARGE CRITERIA:  Ability to meet basic life and health needs Adequate post-discharge living arrangements Improved stabilization in mood, thinking, and/or behavior Medical problems require only outpatient monitoring Motivation to continue treatment in a less acute level of care  PRELIMINARY DISCHARGE PLAN: Placement in alternative living arrangements  PATIENT/FAMILY INVOLVEMENT: This treatment plan has been presented to and reviewed with the patient, Angela [name]. The patient has been given the opportunity to ask questions and make suggestions.  Roxy Manns, RN 09/19/2017, 4:33 PM

## 2017-09-19 NOTE — ED Notes (Signed)
Hourly rounding reveals patient sleeping in room. No complaints, stable, in no acute distress. Q15 minute rounds and monitoring via Security Cameras to continue. 
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
## 2017-09-19 NOTE — ED Notes (Signed)
Patient discharged to [facility] under IVC.  VS stable. Report given to Mechele Claude, [place].  All belongings sent with patient.

## 2017-09-19 NOTE — B[facility] Group Notes (Signed)
LCSW Group Therapy Note  09/19/2017 1:00 pm  Type of Therapy/Topic:  Group Therapy:  Emotion Regulation  Participation Level:  Did Not Attend   Description of Group:    The purpose of this group is to assist patients in learning to regulate negative emotions and experience positive emotions. Patients will be guided to discuss ways in which they have been vulnerable to their negative emotions. These vulnerabilities will be juxtaposed with experiences of positive emotions or situations, and patients will be challenged to use positive emotions to combat negative ones. Special emphasis will be placed on coping with negative emotions in conflict situations, and patients will process healthy conflict resolution skills.  Therapeutic Goals: 1. Patient will identify two positive emotions or experiences to reflect on in order to balance out negative emotions 2. Patient will label two or more emotions that they find the most difficult to experience 3. Patient will demonstrate positive conflict resolution skills through discussion and/or role plays  Summary of Patient Progress:  [place] was invited to today's group, but chose not to attend.     Therapeutic Modalities:   Cognitive Behavioral Therapy Feelings Identification Dialectical Behavioral Therapy

## 2017-09-19 NOTE — ED Notes (Signed)
IVC/ Pending Admit BMU
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
## 2017-09-19 NOTE — ED Provider Notes (Addendum)
-----------------------------------------   5:45 AM on 09/19/2017 -----------------------------------------   Blood pressure 128/79, pulse 75, temperature 97.9 F (36.6 C), temperature source Oral, resp. rate 18, height 1.6 m (5\' 3" ), weight 59 kg, SpO2 100 %.  The patient had no acute events since last update.  Calm and cooperative at this time.    The plan is to take the patient downstairs to the [facility] behavioral unit when a bed is available    Hinda Kehr, MD 09/19/17 [id number]
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
## 2017-09-19 NOTE — BH Assessment (Signed)
Patient is to be admitted to [facility] by Dr. Weber Cooks.  Attending Physician will be Dr. Bary Leriche.   Patient has been assigned to room 320, by [facility] Charge Nurse [place].   Intake Paper Work has been signed and placed on patient chart.  ER staff is aware of the admission:  [facility]   Dr. Karma Greaser, ER MD   Dominica Severin Patient's Nurse   Otilio Jefferson Patient Access.

## 2017-09-19 NOTE — Progress Notes (Signed)
Received Angela [name] from the ED, alert and oriented x4. She was taken to the search room and  The admission process was completed. She stated feeling suicidal before her arrival  to the hospital related to her living situation at the group home. She does not want to return to the group home. She wants independent living. The skin assessment was completed, she c/o rash under her breast and on her arms. She is wearing support stocking on both ankles and her knees with special support shoes for ambulation.

## 2017-09-20 ENCOUNTER — Ambulatory Visit: Payer: Medicare PPO | Admitting: Podiatry

## 2017-09-20 LAB — LIPID PANEL
CHOL/HDL RATIO: 3.5 ratio
Cholesterol: 173 mg/dL (ref 0–200)
HDL: 50 mg/dL (ref >40)
LDL Cholesterol: 84 mg/dL (ref 0–99)
Triglycerides: 193 mg/dL — ABNORMAL HIGH (ref <150)
VLDL: 39 mg/dL (ref 0–40)

## 2017-09-20 LAB — HEMOGLOBIN A1C
Hgb A1c MFr Bld: 5.7 % — ABNORMAL HIGH (ref 4.8–5.6)
MEAN PLASMA GLUCOSE: 116.89 mg/dL

## 2017-09-20 LAB — TSH: TSH: 0.95 u[IU]/mL (ref 0.350–4.500)

## 2017-09-20 MED ORDER — VITAMIN D 1000 UNITS PO TABS
1000.0000 [IU] | ORAL_TABLET | Freq: Every day | ORAL | Status: DC
  Administered 2017-09-20 – 2017-11-06 (×46): 1000 [IU] via ORAL
  Filled 2017-09-20 (×47): qty 1

## 2017-09-20 MED ORDER — FERROUS SULFATE 325 (65 FE) MG PO TABS
325.0000 mg | ORAL_TABLET | Freq: Every day | ORAL | Status: DC
  Administered 2017-09-21 – 2017-11-06 (×46): 325 mg via ORAL
  Filled 2017-09-20 (×48): qty 1

## 2017-09-20 MED ORDER — OMEGA-3-ACID ETHYL ESTERS 1 G PO CAPS
1.0000 g | ORAL_CAPSULE | Freq: Two times a day (BID) | ORAL | Status: DC
  Administered 2017-09-20 – 2017-11-06 (×79): 1 g via ORAL
  Filled 2017-09-20 (×97): qty 1

## 2017-09-20 MED ORDER — MAGNESIUM OXIDE 400 (241.3 MG) MG PO TABS
200.0000 mg | ORAL_TABLET | Freq: Two times a day (BID) | ORAL | Status: DC
  Administered 2017-09-20 – 2017-11-06 (×82): 200 mg via ORAL
  Filled 2017-09-20 (×98): qty 0.5

## 2017-09-20 MED ORDER — FLUVOXAMINE MALEATE 50 MG PO TABS
100.0000 mg | ORAL_TABLET | Freq: Every day | ORAL | Status: DC
  Administered 2017-09-20: 50 mg via ORAL
  Filled 2017-09-20: qty 2

## 2017-09-20 MED ORDER — ADULT MULTIVITAMIN W/MINERALS CH
1.0000 | ORAL_TABLET | Freq: Every day | ORAL | Status: DC
  Administered 2017-09-20 – 2017-11-06 (×48): 1 via ORAL
  Filled 2017-09-20 (×48): qty 1

## 2017-09-20 MED ORDER — LIDOCAINE 5 % EX PTCH
1.0000 | MEDICATED_PATCH | CUTANEOUS | Status: DC
  Administered 2017-09-20 – 2017-11-05 (×25): 1 via TRANSDERMAL
  Filled 2017-09-20 (×50): qty 1

## 2017-09-20 MED ORDER — CALCIUM CARBONATE ANTACID 500 MG PO CHEW
500.0000 mg | CHEWABLE_TABLET | Freq: Two times a day (BID) | ORAL | Status: DC
  Administered 2017-09-20 – 2017-10-03 (×21): 500 mg via ORAL
  Administered 2017-10-04: 250 mg via ORAL
  Administered 2017-10-04 – 2017-10-16 (×20): 500 mg via ORAL
  Filled 2017-09-20 (×56): qty 2.5

## 2017-09-20 MED ORDER — FLUCONAZOLE 100 MG PO TABS
100.0000 mg | ORAL_TABLET | Freq: Every day | ORAL | Status: AC
  Administered 2017-09-20 – 2017-09-21 (×3): 100 mg via ORAL
  Filled 2017-09-20 (×3): qty 1

## 2017-09-20 MED ORDER — DICLOFENAC SODIUM 1 % TD GEL
4.0000 g | Freq: Four times a day (QID) | TRANSDERMAL | Status: DC
  Administered 2017-09-20 – 2017-11-05 (×135): 4 g via TOPICAL
  Filled 2017-09-20 (×7): qty 100

## 2017-09-20 MED ORDER — TIZANIDINE HCL 2 MG PO TABS
2.0000 mg | ORAL_TABLET | Freq: Three times a day (TID) | ORAL | Status: DC | PRN
  Administered 2017-09-29 – 2017-11-05 (×10): 2 mg via ORAL
  Filled 2017-09-20 (×14): qty 1

## 2017-09-20 MED ORDER — TRAZODONE HCL 100 MG PO TABS
100.0000 mg | ORAL_TABLET | Freq: Every day | ORAL | Status: DC
  Administered 2017-09-20: 100 mg via ORAL
  Filled 2017-09-20: qty 1

## 2017-09-20 MED ORDER — PANTOPRAZOLE SODIUM 40 MG PO TBEC
40.0000 mg | DELAYED_RELEASE_TABLET | Freq: Every day | ORAL | Status: DC
  Administered 2017-09-21 – 2017-09-25 (×5): 40 mg via ORAL
  Filled 2017-09-20 (×6): qty 1

## 2017-09-20 NOTE — Progress Notes (Signed)
Patient wants to contact [facility] to get her belongings.

## 2017-09-20 NOTE — Progress Notes (Signed)
MD notified about patient refusing her Latuda this morning because "it gives me bad side effects".

## 2017-09-20 NOTE — Progress Notes (Signed)
Recreation Therapy Notes   Date: 09/20/2017  Time: 9:30 am   Location: Craft room   Behavioral response: N/A   Intervention Topic: Coping  Discussion/Intervention: Patient did not attend group.   Clinical Observations/Feedback:  Patient did not attend group.   Arbie Blankley LRT/CTRS        Debbrah Sampedro 09/20/2017 10:43 AM

## 2017-09-20 NOTE — Progress Notes (Signed)
Nutrition Brief Note  Patient identified on the Malnutrition Screening Tool (MST) Report  Wt Readings from Last 15 Encounters:  09/19/17 55.8 kg  09/17/17 59 kg  08/17/17 63.5 kg  08/15/17 63.5 kg  07/23/17 68 kg  01/18/17 59 kg  12/26/16 60.8 kg  12/06/16 60.8 kg  12/05/16 61.1 kg  12/01/16 61.1 kg  11/27/16 59 kg  11/26/16 59.9 kg  11/25/16 59.9 kg  11/17/16 59.4 kg  10/28/16 59 kg    Body mass index is 21.79 kg/m. Patient meets criteria for normal based on current BMI.   Current diet order is regular, patient is consuming approximately 100% of meals at this time. Labs and medications reviewed.   No nutrition interventions warranted at this time. If nutrition issues arise, please consult RD.   Koleen Distance MS, RD, LDN Pager #- [phone] Office#- [phone] After Hours Pager: [phone]
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
## 2017-09-20 NOTE — Tx Team (Signed)
Initial Treatment Plan 09/20/2017 8:01 PM [name] HTM:[id number]    PATIENT STRESSORS: Financial difficulties Health problems Traumatic event Other: "Pain" "fear" "trauma" "homeless"   PATIENT STRENGTHS: Ability for insight Communication skills General fund of knowledge Motivation for treatment/growth  "pain tolerance"   PATIENT IDENTIFIED PROBLEMS: "Pain" 09-20-17  "fear" 09-20-17  "trauma" 09-20-17  "homeless" 09-20-17               DISCHARGE CRITERIA:  Ability to meet basic life and health needs Adequate post-discharge living arrangements Improved stabilization in mood, thinking, and/or behavior Medical problems require only outpatient monitoring Motivation to continue treatment in a less acute level of care Need for constant or close observation no longer present Safe-care adequate arrangements made Verbal commitment to aftercare and medication compliance  PRELIMINARY DISCHARGE PLAN: Outpatient therapy Placement in alternative living arrangements  PATIENT/FAMILY INVOLVEMENT: This treatment plan has been presented to and reviewed with the patient, [name].  The patient has been given the opportunity to ask questions and make suggestions.   Angela Ivan, RN 09/20/2017, 8:01 PM

## 2017-09-20 NOTE — BHH Suicide Risk Assessment (Signed)
[place] INPATIENT:  Family/Significant Other Suicide Prevention Education  Suicide Prevention Education:  Patient Refusal for Family/Significant Other Suicide Prevention Education: The patient [name] has refused to provide written consent for family/significant other to be provided Family/Significant Other Suicide Prevention Education during admission and/or prior to discharge.  Physician notified.  [place] 09/20/2017, 11:46 AM

## 2017-09-20 NOTE — BHH Group Notes (Signed)
LCSW Group Therapy Note  09/20/2017 1:00pm  Type of Therapy/Topic:  Group Therapy:  Balance in Life  Participation Level:  Did Not Attend  Description of Group:    This group will address the concept of balance and how it feels and looks when one is unbalanced. Patients will be encouraged to process areas in their lives that are out of balance and identify reasons for remaining unbalanced. Facilitators will guide patients in utilizing problem-solving interventions to address and correct the stressor making their life unbalanced. Understanding and applying boundaries will be explored and addressed for obtaining and maintaining a balanced life. Patients will be encouraged to explore ways to assertively make their unbalanced needs known to significant others in their lives, using other group members and facilitator for support and feedback.  Therapeutic Goals: 1. Patient will identify two or more emotions or situations they have that consume much of in their lives. 2. Patient will identify signs/triggers that life has become out of balance:  3. Patient will identify two ways to set boundaries in order to achieve balance in their lives:  4. Patient will demonstrate ability to communicate their needs through discussion and/or role plays  Summary of Patient Progress: Angela [name] was invited to today's group, but chose not to attend.     Therapeutic Modalities:   Cognitive Behavioral Therapy Solution-Focused Therapy Assertiveness Training  Devona Konig, [place] 09/20/2017 3:49 PM
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
## 2017-09-20 NOTE — Plan of Care (Signed)
Pt. Is complaint with medications with redirection and encouragement from staff. Pt. Denies SI/HI and verbally is able to contract for safety. Pt. Reports she can remain safe while on the unit.    Problem: Medication: Goal: Compliance with prescribed medication regimen will improve Outcome: Progressing   Problem: Self-Concept: Goal: Ability to disclose and discuss suicidal ideas will improve Outcome: Progressing   Problem: Safety: Goal: Ability to remain free from injury will improve Outcome: Progressing

## 2017-09-20 NOTE — BHH Counselor (Signed)
During PSA interview, pt got angry when asked about past trauma, and abruptly left the interview.  I was unable to de-escalate.  She continues to refuse to talk to me.
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
## 2017-09-20 NOTE — Progress Notes (Signed)
D- Patient alert and oriented. Patient presents in an irritable mood on assessment stating that she needs her right  Medications and she needs to speak with the doctor and needs her consults in. Patient is very upset that her medications aren't right and she needs to see multiple doctors. Patient is very demanding and and needy and wants what she wants when she wants it. Patient denies SI, HI, AVH, and pain at this time. Patient's goal for today is to "get my medications right and get the consults in".  A- Scheduled medications administered to patient, per MD orders. Support and encouragement provided.  Routine safety checks conducted every 15 minutes.  Patient informed to notify staff with problems or concerns.  R- No adverse drug reactions noted. Patient contracts for safety at this time. Patient compliant with medications and treatment plan. Patient receptive, calm, and cooperative. Patient interacts well with others on the unit.  Patient remains safe at this time.
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
## 2017-09-20 NOTE — Progress Notes (Signed)
Recreation Therapy Notes  INPATIENT RECREATION THERAPY ASSESSMENT  Patient Details Name: Mackenze Grandison MRN: [id number] DOB: 1950-08-19 Today's Date: 09/20/2017       Information Obtained From: (Patient refused)  Able to Participate in Assessment/Interview:    Patient Presentation:    Reason for Admission (Per Patient):    Patient Stressors:    Coping Skills:      Leisure Interests (2+):     Frequency of Recreation/Participation:    Awareness of Community Resources:     [employer]:     Current Use:    If no, Barriers?:    Expressed Interest in [place] of Residence:     Patient Main Form of Transportation:    Patient Strengths:     Patient Identified Areas of Improvement:     Patient Goal for Hospitalization:     Current SI (including self-harm):     Current HI:     Current AVH:    Staff Intervention Plan:    Consent to Intern Participation:    Pleasant Bensinger 09/20/2017, 3:27 PM

## 2017-09-20 NOTE — Progress Notes (Signed)
D: Pt denies SI/HI/AVH, verbally is able to contract for safety. Pt. When observed by this writer around the unit and by staff is observed walking around the unit with a steady gait and not utilizing front wheel walker. Pt. Reports fall prior to admission and so has been placed on high fall risk precautions with (yellow socks, arm band, and other interventions). Pt. Reports she is not able to shower herself and asks female BHT to help shower her. Reported from female BHT patient was able to shower herself independently with only very minor if any help at all. Pt. Given shower chair for extra precautions to reduce falls risk. Pt. Given front wheel walker with extensive education, but has been refusing to utilize for safety and or is forgetting frequently. Pt. Denies pain, but presents with many various somatic complaints that are present and later are denied, but periodically return and patient hyper focuses on to irritability with staff. Pt. Is very demanding and assertive with staff and can be argumentative. Pt. This evening is very intrusive with staff and peers and presents anxious. Pt. Reports depression, but will not give specific number for rating, just says it's high. Pt. With staff seems paranoid about her care and medications. Pt. Denies need for PRN medications for anxiety. Pt. Given extensive education on medications to comfort patient on medication regimen. Pt. Has PT consult that has not come yet, but should be seeing patient soon.   A: Q x 15 minute observation checks were completed for safety. Patient was provided with education, but needs reinforcement.  Patient was given/offered medications per orders. Patient  was encourage to attend groups, participate in unit activities and continue with plan of care. Pt. Chart and plans of care reviewed. Pt. Given support and encouragement.    R: Patient is complaint with medication and unit procedures with redirection. Will continue to encourage patient to  attend to ADL's as independently as she can, but with staff supervision for safety.              Precautionary checks every 15 minutes for safety maintained, room free of safety hazards, patient sustains no injury or falls during this shift. Will endorse care to next shift.

## 2017-09-21 ENCOUNTER — Encounter: Payer: Medicare PPO | Admitting: Podiatry

## 2017-09-21 MED ORDER — FLUVOXAMINE MALEATE 50 MG PO TABS: 50.0000 mg | ORAL_TABLET | Freq: Every day | ORAL | Status: DC

## 2017-09-21 MED ORDER — PAROXETINE HCL ER 12.5 MG PO TB24: 25.0000 mg | ORAL_TABLET | Freq: Every day | ORAL | Status: DC

## 2017-09-21 MED ORDER — PALIPERIDONE ER 3 MG PO TB24: 3.0000 mg | ORAL_TABLET | Freq: Every day | ORAL | Status: DC

## 2017-09-21 MED ORDER — OLANZAPINE 10 MG IM SOLR: 5.0000 mg | Freq: Every day | INTRAMUSCULAR | Status: DC

## 2017-09-21 MED ORDER — PRAZOSIN HCL 2 MG PO CAPS
2.0000 mg | ORAL_CAPSULE | Freq: Two times a day (BID) | ORAL | Status: DC
  Administered 2017-09-21 – 2017-09-26 (×11): 2 mg via ORAL
  Filled 2017-09-21 (×13): qty 1

## 2017-09-21 MED ORDER — TRAZODONE HCL 50 MG PO TABS
50.0000 mg | ORAL_TABLET | Freq: Every day | ORAL | Status: DC
  Administered 2017-09-21 – 2017-10-13 (×22): 50 mg via ORAL
  Filled 2017-09-21 (×25): qty 1

## 2017-09-21 MED ORDER — PAROXETINE HCL ER 12.5 MG PO TB24
12.5000 mg | ORAL_TABLET | Freq: Every day | ORAL | Status: DC
  Administered 2017-09-22 – 2017-09-24 (×3): 12.5 mg via ORAL
  Filled 2017-09-21 (×4): qty 1

## 2017-09-21 NOTE — BHH Group Notes (Signed)
[facility] [facility] LCSW Group Therapy Note  Date/Time: 09/21/17, 1300  Type of Therapy and Topic:  Group Therapy:  Feelings around Relapse and Recovery  Participation Level:  Active   Mood: assertive  Description of Group:    Patients in this group will discuss emotions they experience before and after a relapse. They will process how experiencing these feelings, or avoidance of experiencing them, relates to having a relapse. Facilitator will guide patients to explore emotions they have related to recovery. Patients will be encouraged to process which emotions are more powerful. They will be guided to discuss the emotional reaction significant others in their lives may have to patients' relapse or recovery. Patients will be assisted in exploring ways to respond to the emotions of others without this contributing to a relapse.  Therapeutic Goals: 1. Patient will identify two or more emotions that lead to relapse for them:  2. Patient will identify two emotions that result when they relapse:  3. Patient will identify two emotions related to recovery:  4. Patient will demonstrate ability to communicate their needs through discussion and/or role plays.   Summary of Patient Progress:Pt shared that anger is an emotion that is difficult for her to experience.  Pt denied substance use issues but shared that she had been "addicted to the system" and talked about being unable to find the help that she needs. Pt had to be redirected not to monopolize the group discussion time.     Therapeutic Modalities:   Cognitive Behavioral Therapy Solution-Focused Therapy Assertiveness Training Relapse Prevention Therapy  Lurline Idol, LCSW

## 2017-09-21 NOTE — Progress Notes (Signed)
[facility] Second Physician Opinion Progress Note for Medication Administration to Non-consenting Patients (For Involuntarily Committed Patients)  Patient: Angela [name] Date of Birth: 2050-09-20 MRN: [id number]  Reason for the Medication: The patient, without the benefit of the specific treatment measure, is incapable of participating in any available treatment plan that will give the patient a realistic opportunity of improving the patient's condition.  Consideration of Side Effects: Consideration of the side effects related to the medication plan has been given.  Rationale for Medication Administration: Patient continues to have labile mood agitation poor self-care and poor insight.  Her bipolar disorder is poorly controlled and as a result she is unable to make rational decisions.  She is refusing medications without clear rational thinking.  Requires medication in order to improve to the point where she could possibly be discharged.    Alethia Berthold, MD 09/21/17  5:28 PM   This documentation is good for (7) seven days from the date of the MD signature. New documentation must be completed every seven (7) days with detailed justification in the medical record if the patient requires continued non-emergent administration of psychotropic medications.
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
## 2017-09-21 NOTE — Tx Team (Signed)
Interdisciplinary Treatment and Diagnostic Plan Update  09/21/2017 Time of Session: [place] MRN: [id number]  Principal Diagnosis: Bipolar I disorder, current or most recent episode manic, with psychotic features ([place])  Secondary Diagnoses: Principal Problem:   Bipolar I disorder, current or most recent episode manic, with psychotic features ([place]) Active Problems:   Hypothyroidism   Essential hypertension   Plantar fasciitis, bilateral   PTSD (post-traumatic stress disorder)   Agitation   Current Medications:  Current Facility-Administered Medications  Medication Dose Route Frequency Provider Last Rate Last Dose  . acetaminophen (TYLENOL) tablet 650 mg  650 mg Oral Q6H PRN Clapacs, John T, MD      . alum & mag hydroxide-simeth (MAALOX/MYLANTA) 200-200-20 MG/5ML suspension 30 mL  30 mL Oral Q4H PRN Clapacs, John T, MD      . amLODipine (NORVASC) tablet 5 mg  5 mg Oral BID Clapacs, Madie Reno, MD   5 mg at 09/21/17 0904  . calcium carbonate (TUMS - dosed in mg elemental calcium) chewable tablet 500 mg of elemental calcium  500 mg of elemental calcium Oral BID WC Pucilowska, Jolanta B, MD   500 mg of elemental calcium at 09/21/17 0907  . cholecalciferol (VITAMIN D) tablet 1,000 Units  1,000 Units Oral Daily Pucilowska, Jolanta B, MD   1,000 Units at 09/21/17 0904  . cycloSPORINE (RESTASIS) 0.05 % ophthalmic emulsion 1 drop  1 drop Both Eyes BID Clapacs, Madie Reno, MD   1 drop at 09/21/17 0906  . diclofenac sodium (VOLTAREN) 1 % transdermal gel 4 g  4 g Topical QID Pucilowska, Jolanta B, MD   4 g at 09/21/17 0904  . feeding supplement (ENSURE ENLIVE) (ENSURE ENLIVE) liquid 237 mL  237 mL Oral TID BM Pucilowska, Jolanta B, MD   237 mL at 09/21/17 1033  . ferrous sulfate tablet 325 mg  325 mg Oral Q breakfast Pucilowska, Jolanta B, MD   325 mg at 09/21/17 0904  . fluvoxaMINE (LUVOX) tablet 100 mg  100 mg Oral QHS Pucilowska, Jolanta B, MD   50 mg at 09/20/17 2142  . gabapentin (NEURONTIN)  capsule 100 mg  100 mg Oral BID Clapacs, Madie Reno, MD   100 mg at 09/21/17 0904  . hydrOXYzine (ATARAX/VISTARIL) tablet 50 mg  50 mg Oral TID PRN Clapacs, John T, MD      . levothyroxine (SYNTHROID, LEVOTHROID) tablet 100 mcg  100 mcg Oral QAC breakfast Clapacs, Madie Reno, MD   100 mcg at 09/21/17 0659  . lidocaine (LIDODERM) 5 % 1 patch  1 patch Transdermal Q24H Pucilowska, Jolanta B, MD   1 patch at 09/20/17 1821  . linaclotide (LINZESS) capsule 290 mcg  290 mcg Oral QAC breakfast Clapacs, Madie Reno, MD   290 mcg at 09/21/17 0905  . LORazepam (ATIVAN) tablet 0.5 mg  0.5 mg Oral Q6H PRN Clapacs, John T, MD   0.5 mg at 09/21/17 1043  . lurasidone (LATUDA) tablet 40 mg  40 mg Oral Q breakfast Clapacs, John T, MD      . magnesium hydroxide (MILK OF MAGNESIA) suspension 30 mL  30 mL Oral Daily PRN Clapacs, John T, MD      . magnesium oxide (MAG-OX) tablet 200 mg  200 mg Oral BID Pucilowska, Jolanta B, MD   200 mg at 09/21/17 0911  . multivitamin with minerals tablet 1 tablet  1 tablet Oral Daily Pucilowska, Jolanta B, MD   1 tablet at 09/21/17 0903  . omega-3 acid ethyl esters (LOVAZA) capsule  1 g  1 g Oral BID Pucilowska, Jolanta B, MD   1 g at 09/21/17 0911  . pantoprazole (PROTONIX) EC tablet 40 mg  40 mg Oral Daily Pucilowska, Jolanta B, MD   40 mg at 09/21/17 0903  . tiZANidine (ZANAFLEX) tablet 2 mg  2 mg Oral Q8H PRN Pucilowska, Jolanta B, MD      . traZODone (DESYREL) tablet 100 mg  100 mg Oral QHS Pucilowska, Jolanta B, MD   100 mg at 09/20/17 2142   PTA Medications: Medications Prior to Admission  Medication Sig Dispense Refill Last Dose  . amLODipine (NORVASC) 5 MG tablet Take 1 tablet (5 mg total) by mouth 2 (two) times daily. 180 tablet 1   . ANORO ELLIPTA 62.5-25 MCG/INH AEPB Inhale 1 puff into the lungs daily. 14 each 0   . calcium carbonate (OS-CAL) 600 MG TABS tablet Take 1 tablet (600 mg total) by mouth 2 (two) times daily with a meal. (Patient taking differently: Take 600 mg 3 (three)  times daily by mouth. ) 60 tablet 0 12/26/2016 at Unknown time  . cholecalciferol (VITAMIN D) 1000 units tablet Take 4,000 Units by mouth daily.     . citalopram (CELEXA) 20 MG tablet Take 20 mg by mouth daily.  0   . conjugated estrogens (PREMARIN) vaginal cream Place vaginally 3 (three) times a week. Use 1/2 gram 3-4 times weekly. 42.5 g 4   . cycloSPORINE (RESTASIS) 0.05 % ophthalmic emulsion Place 1 drop into both eyes 2 (two) times daily. 60 each 5   . diclofenac sodium (VOLTAREN) 1 % GEL Apply 2 g topically 4 (four) times daily.     . ferrous sulfate (FERROUSUL) 325 (65 FE) MG tablet Take 1 tablet (325 mg total) by mouth daily with breakfast. 90 tablet 1   . gabapentin (NEURONTIN) 100 MG capsule Take 1-2 capsules (100-200 mg total) by mouth 3 (three) times daily. (Patient taking differently: Take 100 mg by mouth 2 (two) times daily. ) 540 capsule 1   . guaiFENesin (MUCINEX) 600 MG 12 hr tablet Take 1 tablet (600 mg total) by mouth 2 (two) times daily as needed for cough. (Patient taking differently: Take 600 mg by mouth daily. ) 30 tablet 0   . hydrocortisone (PROCTOZONE-HC) 2.5 % rectal cream Place 1 application rectally 2 (two) times daily. 30 g 1   . hydrOXYzine (ATARAX/VISTARIL) 10 MG tablet Take 1 tablet by mouth 2 (two) times daily as needed for anxiety.      Marland Kitchen KRILL OIL OMEGA-3 PO Take 350 mg by mouth daily.     Marland Kitchen levalbuterol (XOPENEX) 1.25 MG/3ML nebulizer solution Inhale 3 mLs into the lungs daily.     Marland Kitchen levothyroxine (SYNTHROID, LEVOTHROID) 100 MCG tablet Take 1 tablet (100 mcg total) by mouth daily before breakfast. 90 tablet 1   . linaclotide (LINZESS) 290 MCG CAPS capsule Take 1 capsule (290 mcg total) by mouth daily before breakfast. 90 capsule 1   . magnesium oxide (MAG-OX) 400 MG tablet Take 400 mg by mouth daily with breakfast.   12/26/2016 at Unknown time  . montelukast (SINGULAIR) 10 MG tablet Take 1 tablet by mouth at bedtime.  11   . omeprazole (PRILOSEC) 20 MG capsule  Take 1 capsule by mouth daily.     Vladimir Faster Glycol-Propyl Glycol (SYSTANE OP) Place 1 drop into both eyes daily as needed (dry eyes).   12/26/2016 at Unknown time  . polyethylene glycol powder (GLYCOLAX/MIRALAX) powder Take 17-34 g by mouth  daily. 1700 g 5 prn at prn  . Probiotic Product (PROBIOTIC PO) Take 1 tablet by mouth daily.     . promethazine (PHENERGAN) 25 MG tablet Take 0.5 tablets (12.5 mg total) by mouth every 6 (six) hours as needed for nausea or vomiting. 30 tablet 0   . Pyridoxine HCl (VITAMIN B-6) 500 MG tablet Take 1 tablet by mouth 2 (two) times daily.   12/26/2016 at Unknown time  . ranitidine (ZANTAC) 150 MG tablet Take 150 mg by mouth daily before supper.   12/26/2016 at Unknown time  . tiZANidine (ZANAFLEX) 2 MG tablet Take 0.5-1 tablets (1-2 mg total) by mouth 2 (two) times daily as needed for muscle spasms. 180 tablet 1 prn at prn  . tolterodine (DETROL LA) 2 MG 24 hr capsule Take 1 capsule (2 mg total) daily by mouth. (Patient not taking: Reported on 12/26/2016) 30 capsule 11 Not Taking at Unknown time  . traZODone (DESYREL) 50 MG tablet Take 50 mg by mouth at bedtime.       Patient Stressors: Financial difficulties Health problems Traumatic event Other: "Pain" "fear" "trauma" "homeless"  Patient Strengths: Ability for insight Communication skills General fund of knowledge Motivation for treatment/growth  Treatment Modalities: Medication Management, Group therapy, Case management,  1 to 1 session with clinician, Psychoeducation, Recreational therapy.   Physician Treatment Plan for Primary Diagnosis: Bipolar I disorder, current or most recent episode manic, with psychotic features ([place]) Long Term Goal(s): Improvement in symptoms so as ready for discharge Improvement in symptoms so as ready for discharge   Short Term Goals: Ability to identify changes in lifestyle to reduce recurrence of condition will improve Ability to verbalize feelings will  improve Ability to disclose and discuss suicidal ideas Ability to demonstrate self-control will improve Ability to identify and develop effective coping behaviors will improve Ability to maintain clinical measurements within normal limits will improve Compliance with prescribed medications will improve Ability to identify triggers associated with substance abuse/mental health issues will improve NA  Medication Management: Evaluate patient's response, side effects, and tolerance of medication regimen.  Therapeutic Interventions: 1 to 1 sessions, Unit Group sessions and Medication administration.  Evaluation of Outcomes: Progressing  Physician Treatment Plan for Secondary Diagnosis: Principal Problem:   Bipolar I disorder, current or most recent episode manic, with psychotic features ([place]) Active Problems:   Hypothyroidism   Essential hypertension   Plantar fasciitis, bilateral   PTSD (post-traumatic stress disorder)   Agitation  Long Term Goal(s): Improvement in symptoms so as ready for discharge Improvement in symptoms so as ready for discharge   Short Term Goals: Ability to identify changes in lifestyle to reduce recurrence of condition will improve Ability to verbalize feelings will improve Ability to disclose and discuss suicidal ideas Ability to demonstrate self-control will improve Ability to identify and develop effective coping behaviors will improve Ability to maintain clinical measurements within normal limits will improve Compliance with prescribed medications will improve Ability to identify triggers associated with substance abuse/mental health issues will improve NA     Medication Management: Evaluate patient's response, side effects, and tolerance of medication regimen.  Therapeutic Interventions: 1 to 1 sessions, Unit Group sessions and Medication administration.  Evaluation of Outcomes: Progressing   RN Treatment Plan for Primary Diagnosis: Bipolar I  disorder, current or most recent episode manic, with psychotic features ([place]) Long Term Goal(s): Knowledge of disease and therapeutic regimen to maintain health will improve  Short Term Goals: Ability to identify and develop effective coping behaviors will  improve and Compliance with prescribed medications will improve  Medication Management: RN will administer medications as ordered by provider, will assess and evaluate patient's response and provide education to patient for prescribed medication. RN will report any adverse and/or side effects to prescribing provider.  Therapeutic Interventions: 1 on 1 counseling sessions, Psychoeducation, Medication administration, Evaluate responses to treatment, Monitor vital signs and CBGs as ordered, Perform/monitor CIWA, COWS, AIMS and Fall Risk screenings as ordered, Perform wound care treatments as ordered.  Evaluation of Outcomes: Progressing   LCSW Treatment Plan for Primary Diagnosis: Bipolar I disorder, current or most recent episode manic, with psychotic features ([place]) Long Term Goal(s): Safe transition to appropriate next level of care at discharge, Engage patient in therapeutic group addressing interpersonal concerns.  Short Term Goals: Engage patient in aftercare planning with referrals and resources, Increase social support and Increase skills for wellness and recovery  Therapeutic Interventions: Assess for all discharge needs, 1 to 1 time with Social worker, Explore available resources and support systems, Assess for adequacy in community support network, Educate family and significant other(s) on suicide prevention, Complete Psychosocial Assessment, Interpersonal group therapy.  Evaluation of Outcomes: Progressing   Progress in Treatment: Attending groups: No. Participating in groups: No. Taking medication as prescribed: Yes. Toleration medication: Yes. Family/Significant other contact made: No, will contact:  pt declined  consent Patient understands diagnosis: Yes. Discussing patient identified problems/goals with staff: Yes. Medical problems stabilized or resolved: Yes. Denies suicidal/homicidal ideation: Yes. Issues/concerns per patient self-inventory: No. Other: none  New problem(s) identified: No, Describe:  none  New Short Term/Long Term Goal(s):  Patient Goals:    Discharge Plan or Barriers:   Reason for Continuation of Hospitalization: Anxiety Other; describe aggressoion at placement  Estimated Length of Stay: 2-4 days.  Attendees: Patient: 09/21/2017   Physician: Dr Bary Leriche, MD 09/21/2017   Nursing: Holli Humbles, RN 09/21/2017   RN Care Manager: 09/21/2017   Social Worker: Lurline Idol, [place] 09/21/2017   Recreational Therapist: Roanna Epley 09/21/2017   Other: Derrek Gu, LCSW 09/21/2017   Other: Marney Doctor, [place] 09/21/2017   Other: 09/21/2017     Scribe for Treatment Team: Joanne Chars, [place] 09/21/2017 12:44 PM

## 2017-09-21 NOTE — Progress Notes (Addendum)
CSW spoke to [facility] [phone] regarding pt check for September.  They reported pt money should be in her bank account.  The money does not come directly to them and they have not accessed Sept funds.  She was given "immediate discharge" on 9/1.   Winferd Humphrey, MSW, LCSW Clinical Social Worker 09/21/2017 10:02 AM   Pt asked CSW to contact Sherrell at [facility] on [address] in [place]. [phone].  Pt reports they have an apartment for her.  CSW did contact Sherrell, who has been in touch with pt, however, she does not have any available apartments at this time.  Will be October before she has an opening, most likely.   Winferd Humphrey, MSW, LCSW Clinical Social Worker 09/21/2017 3:14 PM
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
## 2017-09-21 NOTE — Progress Notes (Signed)
Recreation Therapy Notes  Date: 09/21/2017  Time: 9:30 pm   Location: [facility]   Behavioral response: N/A   Intervention Topic: Happiness  Discussion/Intervention: Patient did not attend group.   Clinical Observations/Feedback:  Patient did not attend group.   Callee Rohrig LRT/CTRS        Jade Burkard 09/21/2017 1:46 PM

## 2017-09-21 NOTE — Plan of Care (Signed)
Pt. Denies si/hi and contracts verbally for safety. Pt. Able to remain safe while on the unit, but continues to refuse to adhere to utilizing front wheel walker for safety at all times and will frequently not utilize front wheel walker for safety. Pt. Is non-complaint with medications this evening. Pt. Attending notified of refusal to take 100mg  of nighttime medication, and that she only took 50mg s. Pt. Non-compliance aware of my MD and will address with treatment team.    Problem: Self-Concept: Goal: Ability to disclose and discuss suicidal ideas will improve Outcome: Progressing   Problem: Safety: Goal: Ability to remain free from injury will improve Outcome: Progressing   Problem: Medication: Goal: Compliance with prescribed medication regimen will improve Outcome: Not Progressing

## 2017-09-21 NOTE — Progress Notes (Signed)
D: Pt denies SI/HI/AVH, contracts for safety. Pt. Has numerous complaints about her care that is being provided by staff and is verbally aggressive with staff over this. Pt. Is irritable when demands are not addressed immediately, regardless of request. Pt. Refuses to participate in unit activities and/or groups without strong redirection and encouragement. Pt. Frequently expresses multiple generalized somatic concerns that are not able to be addressed to the patient's desire, which cause heightened agitations. Comfort measures offered with no success. Pt. Demands when she expresses multiple vague physical complaints that she be given procedures such as: x-rays, ct scans, multiple physician specialty consults, and specialized equipment not allowed on the unit for safety, to be brought to her aid. Pt. Given education this is not realistic for [place] to comply with, but assurance is given to attempt to promote safety and comfort. MD notified of behaviors and this writer frequently gave patient education and redirection to patient. Pt. Limit setting interventions implemented by staff. Pt. When given her nighttime medications states, "oh so she doubled all my doses did she.. 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 Pt. Took her medications and half her luvox. MD aware of this partial compliance.     A: Q x 15 minute observation checks were completed for safety. Patient was provided with education, but needs lots of reinforcement.  Patient was given/offered medications per orders. Patient  was encourage to attend groups, participate in unit activities and continue with plan of care. Pt. Chart and plans of care reviewed. Pt. Given support and encouragement.   R: Patient is non-complaint with medication and unit procedures. Pt.  Refuses to utilize front wheel walker for safety often and will walk utilizing a steady gait without walker. Pt. Given extensive education several times to utilize front wheel walker for safety per PT recommendations. Pt. Attending MD notified of partial compliance this evening with night time medications. Attending will address with treatment team. Pt. Pain being managed utilizing MD orders to meet pain goals.              Precautionary checks every 15 minutes for safety maintained, room free of safety hazards, patient sustains no injury or falls during this shift. Will endorse care to next shift.

## 2017-09-22 NOTE — [facility] Group Notes (Signed)
[facility] Group Notes:  (Nursing/MHT/Case Management/Adjunct)  Date:  09/22/2017  Time:  12:42 AM  Type of Therapy:  Group Therapy  Participation Level:  Active  Participation Quality:  Attentive  Affect:  Appropriate  Cognitive:  Alert  Insight:  Appropriate  Engagement in Group:  Engaged  Modes of Intervention:  Discussion  Summary of Progress/Problems: [name] attended group. [name] had multiple questions related to her belongings and when they would be given to her. [name] questioned MHT what she could have in her room. [name] reported her depression and anxiety was high on this day. [name] reported several medical issues. [name] stated her eyes are infected, she had a punctured ear drum, constipated, pain in head, shoulders, ankles and back. MHT reviewed the rules and expectations of the unit. MHT addressed concerns with patients sharing clothes. MHT processed with patients why it was a rule and why it had to be followed. MHT processed with patients about the snack schedule. MHT addressed concerns patients expressed during wrap up. MHT provided patients with self-inventory sheet to complete.  [name] 09/22/2017, 12:42 AM

## 2017-09-22 NOTE — Plan of Care (Signed)
Support and encouragement is provided to patient, took her Pm  medicines , patient participated in group meeting, with  peers, and patient  had multiple questions about her cloths and why I'm   still here, reinforce patient of her coping skills  and to verbalize positive feeling about self, patient understood information provided, patient sleep through the night and only requires routine visual checks  , contract for safety and denies any SI/HI, no signs of AVH at this time.   Problem: Education: Goal: Ability to make informed decisions regarding treatment will improve Outcome: Progressing   Problem: Coping: Goal: Coping ability will improve Outcome: Progressing   Problem: Health Behavior/Discharge Planning: Goal: Identification of resources available to assist in meeting health care needs will improve Outcome: Progressing   Problem: Medication: Goal: Compliance with prescribed medication regimen will improve Outcome: Progressing   Problem: Self-Concept: Goal: Ability to disclose and discuss suicidal ideas will improve Outcome: Progressing Goal: Will verbalize positive feelings about self Outcome: Progressing   Problem: Safety: Goal: Ability to remain free from injury will improve Outcome: Progressing

## 2017-09-22 NOTE — [facility] Group Notes (Signed)
LCSW Group Therapy Note   09/22/2017 1:15pm   Type of Therapy and Topic:  Group Therapy:  Trust and Honesty  Participation Level:  Did Not Attend  Description of Group:    In this group patients will be asked to explore the value of being honest.  Patients will be guided to discuss their thoughts, feelings, and behaviors related to honesty and trusting in others. Patients will process together how trust and honesty relate to forming relationships with peers, family members, and self. Each patient will be challenged to identify and express feelings of being vulnerable. Patients will discuss reasons why people are dishonest and identify alternative outcomes if one was truthful (to self or others). This group will be process-oriented, with patients participating in exploration of their own experiences, giving and receiving support, and processing challenge from other group members.   Therapeutic Goals: 1. Patient will identify why honesty is important to relationships and how honesty overall affects relationships.  2. Patient will identify a situation where they lied or were lied too and the  feelings, thought process, and behaviors surrounding the situation 3. Patient will identify the meaning of being vulnerable, how that feels, and how that correlates to being honest with self and others. 4. Patient will identify situations where they could have told the truth, but instead lied and explain reasons of dishonesty.   Summary of Patient Progress; Pt was invited to attend group but chose not to attend. CSW will continue to encourage pt to attend group throughout their admission.     Therapeutic Modalities:   Cognitive Behavioral Therapy Solution Focused Therapy Motivational Interviewing Brief Therapy  Torrez Renfroe  [name], LCSW 09/22/2017 12:53 PM

## 2017-09-22 NOTE — Progress Notes (Signed)
   09/22/17 1100  Clinical Encounter Type  Visited With Patient  Visit Type Initial  Referral From Physician  Consult/Referral To Chaplain  Spiritual Encounters  Spiritual Needs Prayer;Emotional  Stress Factors  Patient Stress Factors Loss of control  Family Stress Factors None identified  Patient is a 67 y/o WF, with hx of bipolar d/o, requesting spiritual care, emotional support and prayer. [facility] presented to the patient room, while she was reading scripture and praying. She initially refused spiritual care from me a female ([place]), but as I stood in the doorway she continued to ask me questions and eventually wanted me to enter her space. We shared casual conversation for a while, she shared her pain, struggles and recent losses. Pastoral presence ensued, as a quietly listened , acknowledging her struggles and disdain, but occasionally offering hope and words of exhortation. She eventually asked if I would pray for her, which I did, we joined in mutual prayer and exhortation of the God we serve, her spirits seem lifted and she acknowledged the fruits that were derived from our time of prayer together. We continued to have conversation, and prior to leaving salutations were bestowed upon her for continued peace, healing and support.

## 2017-09-22 NOTE — Progress Notes (Signed)
[facility] MD Progress Note  09/22/2017 3:17 PM Angela [name]  MRN:  [id number] Subjective: Follow-up for this patient with bipolar disorder personality disorder chronic noncompliance.  Patient seen chart reviewed.  Spoke with Dr. Mamie Nick yesterday.  Spoke with nursing today.  Patient continues to have almost constant behaviors that are disruptive to the milieu and her treatment.  She is very labile and will go back and forth between saying that she is suicidal or saying that she hates this staff or another staff and then asking to be seen again in begging for various somatic treatments that do not appear to be necessary.  Patient was placed on forced medication orders yesterday to try to get her on some medicine to treat her bipolar disorder.  Still very labile and unable to think clearly enough to facilitate appropriate discharge planning. Principal Problem: Bipolar I disorder, current or most recent episode manic, with psychotic features ([place]) Diagnosis:   Patient Active Problem List   Diagnosis Date Noted  . Bipolar I disorder, current or most recent episode manic, with psychotic features ([place]) [F31.2] 09/18/2017  . Agitation [R45.1] 09/18/2017  . IBS (irritable bowel syndrome) [K58.9] 11/17/2016  . Bilateral carpal tunnel syndrome [G56.03] 11/17/2016  . Plantar fasciitis, bilateral [M72.2] 11/17/2016  . Chronic pain syndrome [G89.4] 11/17/2016  . Chronic hyponatremia [E87.1] 11/17/2016  . PTSD (post-traumatic stress disorder) [F43.10] 11/17/2016  . Hernia, hiatal [K44.9] 11/17/2016  . Vitamin D deficiency [E55.9] 11/17/2016  . Vitamin B12 deficiency [E53.8] 11/17/2016  . SIADH (syndrome of inappropriate ADH production) ([place]) [E22.2] 09/08/2016  . Cervical spondylosis with myelopathy [M47.12] 05/24/2016  . Post-concussion headache [G44.309] 05/24/2016  . MCI (mild cognitive impairment) with memory loss [G31.84] 03/30/2016  . Gait abnormality [R26.9] 03/30/2016  . Chronic bipolar affective disorder  ([place]) [F31.9] 01/24/2016  . Anemia, iron deficiency [D50.9] 06/24/2015  . Endometriosis [N80.9] 06/24/2015  . Essential hypertension [I10] 06/24/2015  . History of postmenopausal bleeding [Z87.42] 05/22/2015  . History of TIA (transient ischemic attack) [Z86.73] 04/20/2015  . Hypothyroidism [E03.9] 04/20/2015  . GERD (gastroesophageal reflux disease) [K21.9] 04/20/2015  . Cervical disc disorder with radiculopathy [M50.10] 03/25/2015  . Chronic neck pain [M54.2, G89.29] 03/25/2015  . Pelvic pain in female [R10.2] 10/30/2014  . History of skin cancer [Z85.828] 06/30/2014  . Chronic headaches [R51] 12/16/2013  . Neuropathy [G62.9] 12/16/2013   Total Time spent with patient: 20 minutes  Past Psychiatric History: Patient has a long history of behavior problems and mental health issues.  Long-standing question has been whether patient has a bipolar disorder that would be amenable to psychiatric treatment or whether her problems are more personality based.  Currently she seems to be on the disorganized and labile side.  Does have a history of acting out self injury in the distant past.  Frequent suicidal threats.  Past Medical History:  Past Medical History:  Diagnosis Date  . Allergic rhinitis   . Anemia   . Blurred vision   . Depression   . Diverticulosis   . Endometriosis   . Falls   . GERD (gastroesophageal reflux disease)   . Hernia, hiatal   . Hypothyroidism   . IBS (irritable bowel syndrome)   . Malignant neoplasm of skin   . Migraine   . Neuropathy   . PTSD (post-traumatic stress disorder)   . Thyroid disease     Past Surgical History:  Procedure Laterality Date  . APPENDECTOMY    . CHOLECYSTECTOMY    . CHOLECYSTECTOMY, LAPAROSCOPIC    .  ESOPHAGOGASTRODUODENOSCOPY (EGD) WITH PROPOFOL N/A 03/29/2015   Procedure: ESOPHAGOGASTRODUODENOSCOPY (EGD) WITH PROPOFOL;  Surgeon: Josefine Class, MD;  Location: [facility] ENDOSCOPY;  Service: Endoscopy;  Laterality: N/A;  . HERNIA  REPAIR    . laproscopy    . TONSILLECTOMY    . uteral suspension     Family History:  Family History  Problem Relation Age of Onset  . Heart disease Father   . Cancer Mother        lung  . Urolithiasis Neg Hx   . Kidney disease Neg Hx   . Kidney cancer Neg Hx   . Prostate cancer Neg Hx    Family Psychiatric  History: See previous note Social History:  Social History   Substance and Sexual Activity  Alcohol Use No     Social History   Substance and Sexual Activity  Drug Use No    Social History   Socioeconomic History  . Marital status: Single    Spouse name: Not on file  . Number of children: 0  . Years of education: 1.5 years of college  . Highest education level: Not on file  Occupational History  . Occupation: Retired  Scientific laboratory technician  . Financial resource strain: Not on file  . Food insecurity:    Worry: Not on file    Inability: Not on file  . Transportation needs:    Medical: Not on file    Non-medical: Not on file  Tobacco Use  . Smoking status: Former Smoker    Last attempt to quit: 11/17/1976    Years since quitting: 40.8  . Smokeless tobacco: Never Used  Substance and Sexual Activity  . Alcohol use: No  . Drug use: No  . Sexual activity: Not Currently  Lifestyle  . Physical activity:    Days per week: Not on file    Minutes per session: Not on file  . Stress: Not on file  Relationships  . Social connections:    Talks on phone: Not on file    Gets together: Not on file    Attends religious service: Not on file    Active member of club or organization: Not on file    Attends meetings of clubs or organizations: Not on file    Relationship status: Not on file  Other Topics Concern  . Not on file  Social History Narrative   Lives at home alone.   Right-handed.   No daily caffeine use.   Additional Social History:    Pain Medications: See PTA Prescriptions: See PTA Over the Counter: See PTA History of alcohol / drug use?: No history of  alcohol / drug abuse Longest period of sobriety (when/how long): Reports of none Negative Consequences of Use: (n/a) Withdrawal Symptoms: (n/a)                    Sleep: Fair  Appetite:  Fair  Current Medications: Current Facility-Administered Medications  Medication Dose Route Frequency Provider Last Rate Last Dose  . acetaminophen (TYLENOL) tablet 650 mg  650 mg Oral Q6H PRN Gerardine Peltz T, MD      . alum & mag hydroxide-simeth (MAALOX/MYLANTA) 200-200-20 MG/5ML suspension 30 mL  30 mL Oral Q4H PRN Chaka Boyson T, MD      . amLODipine (NORVASC) tablet 5 mg  5 mg Oral BID Anab Vivar, Madie Reno, MD   5 mg at 09/22/17 0902  . calcium carbonate (TUMS - dosed in mg elemental calcium) chewable tablet 500 mg of  elemental calcium  500 mg of elemental calcium Oral BID WC Pucilowska, Jolanta B, MD   500 mg of elemental calcium at 09/22/17 0904  . cholecalciferol (VITAMIN D) tablet 1,000 Units  1,000 Units Oral Daily Pucilowska, Jolanta B, MD   1,000 Units at 09/22/17 0903  . cycloSPORINE (RESTASIS) 0.05 % ophthalmic emulsion 1 drop  1 drop Both Eyes BID Loyd Marhefka, Madie Reno, MD   1 drop at 09/22/17 0902  . diclofenac sodium (VOLTAREN) 1 % transdermal gel 4 g  4 g Topical QID Pucilowska, Jolanta B, MD   4 g at 09/22/17 1234  . feeding supplement (ENSURE ENLIVE) (ENSURE ENLIVE) liquid 237 mL  237 mL Oral TID BM Pucilowska, Jolanta B, MD   237 mL at 09/22/17 1415  . ferrous sulfate tablet 325 mg  325 mg Oral Q breakfast Pucilowska, Jolanta B, MD   325 mg at 09/22/17 0903  . gabapentin (NEURONTIN) capsule 100 mg  100 mg Oral BID May Manrique, Madie Reno, MD   100 mg at 09/22/17 0902  . hydrOXYzine (ATARAX/VISTARIL) tablet 50 mg  50 mg Oral TID PRN Amri Lien T, MD      . levothyroxine (SYNTHROID, LEVOTHROID) tablet 100 mcg  100 mcg Oral QAC breakfast Jimmye Wisnieski, Madie Reno, MD   100 mcg at 09/22/17 0724  . lidocaine (LIDODERM) 5 % 1 patch  1 patch Transdermal Q24H Pucilowska, Jolanta B, MD   1 patch at 09/21/17 1631   . linaclotide (LINZESS) capsule 290 mcg  290 mcg Oral QAC breakfast Dyane Broberg, Madie Reno, MD   290 mcg at 09/22/17 0748  . LORazepam (ATIVAN) tablet 0.5 mg  0.5 mg Oral Q6H PRN Shreyan Hinz T, MD   0.5 mg at 09/22/17 1423  . magnesium hydroxide (MILK OF MAGNESIA) suspension 30 mL  30 mL Oral Daily PRN Yaritzy Huser, Madie Reno, MD   30 mL at 09/22/17 0920  . magnesium oxide (MAG-OX) tablet 200 mg  200 mg Oral BID Pucilowska, Jolanta B, MD   200 mg at 09/22/17 0904  . multivitamin with minerals tablet 1 tablet  1 tablet Oral Daily Pucilowska, Jolanta B, MD   1 tablet at 09/22/17 0903  . omega-3 acid ethyl esters (LOVAZA) capsule 1 g  1 g Oral BID Pucilowska, Jolanta B, MD   1 g at 09/22/17 0904  . pantoprazole (PROTONIX) EC tablet 40 mg  40 mg Oral Daily Pucilowska, Jolanta B, MD   40 mg at 09/22/17 0748  . PARoxetine (PAXIL-CR) 24 hr tablet 12.5 mg  12.5 mg Oral Daily Pucilowska, Jolanta B, MD   12.5 mg at 09/22/17 0903  . prazosin (MINIPRESS) capsule 2 mg  2 mg Oral BID Pucilowska, Jolanta B, MD   2 mg at 09/22/17 0903  . tiZANidine (ZANAFLEX) tablet 2 mg  2 mg Oral Q8H PRN Pucilowska, Jolanta B, MD      . traZODone (DESYREL) tablet 50 mg  50 mg Oral QHS Pucilowska, Jolanta B, MD   50 mg at 09/21/17 2214    Lab Results: No results found for this or any previous visit (from the past 48 hour(s)).  Blood Alcohol level:  Lab Results  Component Value Date   [facility] <5 09/29/2016   ETH <5 78/29/5621    Metabolic Disorder Labs: Lab Results  Component Value Date   HGBA1C 5.7 (H) 09/20/2017   MPG 116.89 09/20/2017   No results found for: PROLACTIN Lab Results  Component Value Date   CHOL 173 09/20/2017   TRIG 193 (H)  09/20/2017   HDL 50 09/20/2017   CHOLHDL 3.5 09/20/2017   VLDL 39 09/20/2017   LDLCALC 84 09/20/2017   LDLCALC 87 09/04/2017    Physical Findings: AIMS:  , ,  ,  ,    CIWA:    COWS:     Musculoskeletal: Strength & Muscle Tone: within normal limits Gait & Station: normal, Patient  is clearly able to ambulate without any difficulty although she complains about it and at times will claim that she is unable to walk at all.  She has been requested to use a walker because of her complaints of difficulty ambulating but refuses to use the walker. Patient leans: N/A  Psychiatric Specialty Exam: Physical Exam  Nursing note and vitals reviewed. Constitutional: She appears well-developed and well-nourished.  HENT:  Head: Normocephalic and atraumatic.  Eyes: Pupils are equal, round, and reactive to light. Conjunctivae are normal.  Neck: Normal range of motion.  Cardiovascular: Regular rhythm and normal heart sounds.  Respiratory: Effort normal. No respiratory distress.  GI: Soft.  Musculoskeletal: Normal range of motion.  Neurological: She is alert.  Skin: Skin is warm and dry.  Psychiatric: Her affect is labile and inappropriate. Her speech is tangential. She is agitated. She is not aggressive. Thought content is paranoid. Cognition and memory are impaired. She expresses impulsivity and inappropriate judgment. She expresses no homicidal and no suicidal ideation.    Review of Systems  Constitutional: Negative.   HENT: Negative.   Eyes: Negative.   Respiratory: Negative.   Cardiovascular: Negative.   Gastrointestinal: Negative.   Musculoskeletal: Negative.   Skin: Negative.   Neurological: Negative.   Psychiatric/Behavioral: Positive for depression, memory loss and suicidal ideas. Negative for hallucinations and substance abuse. The patient is nervous/anxious and has insomnia.     Blood pressure 138/75, pulse 64, temperature 97.6 F (36.4 C), temperature source Oral, resp. rate 17, height 5\' 3"  (1.6 m), weight 55.8 kg, SpO2 100 %.Body mass index is 21.79 kg/m.  General Appearance: Disheveled  Eye Contact:  Minimal  Speech:  Pressured  Volume:  Increased  Mood:  Angry, Anxious and Depressed  Affect:  Inappropriate and Labile  Thought Process:  Disorganized   Orientation:  Full (Time, Place, and Person)  Thought Content:  Illogical, Rumination and Tangential  Suicidal Thoughts:  Yes.  without intent/plan  Homicidal Thoughts:  No  Memory:  Immediate;   Fair Recent;   Fair Remote;   Fair  Judgement:  Poor  Insight:  Shallow  Psychomotor Activity:  Restlessness  Concentration:  Concentration: Poor  Recall:  Poor  Fund of Knowledge:  Good  Language:  Good  Akathisia:  No  Handed:  Right  AIMS (if indicated):     Assets:  Desire for Improvement Resilience  ADL's:  Impaired  Cognition:  Impaired,  Mild  Sleep:  Number of Hours: 5.5     Treatment Plan Summary: Daily contact with patient to assess and evaluate symptoms and progress in treatment, Medication management and Plan Patient continues to show labile behavior often bizarre.  She is a source of frequent distress to the staff because of her constant complaints.  She is usually very self-defeating and that she will request some sort of specific treatment and will react badly whether it is provided or not.  Frequently requests treatments that are not appropriate and is unable to engage in a discussion about it.  To my assessment the patient still appears to be in a manic state.  Plan was made  with Dr. Mamie Nick yesterday for the patient to start medication and gradually trying to get on more mood stabilizers.  No change to medicine for today.  Continue to follow up.  Supportive counseling with patient and also with unit staff.  Alethia Berthold, MD 09/22/2017, 3:17 PM

## 2017-09-22 NOTE — Plan of Care (Signed)
Pt. Is partially compliant with medications, refusing some and refusing to take medications and scheduled times often. Pt. During assessments this morning denies SI/Hi and verbally is able to contract for safety.     Problem: Medication: Goal: Compliance with prescribed medication regimen will improve Outcome: Not Progressing   Problem: Self-Concept: Goal: Ability to disclose and discuss suicidal ideas will improve Outcome: Progressing   Problem: Safety: Goal: Ability to remain free from injury will improve Outcome: Progressing
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
## 2017-09-23 NOTE — Progress Notes (Signed)
D: Pt. Denies SI/HI/AVH, and is able to contract for safety. Pt. Continues to present irritable with staff and unhappy about the care that is being provided for her by treatment team. Pt. Expresses she is "pissed off" at the doctor that saw her today as well as the staff in general. Pt. Does come up to medication room to take her medications, which is an improvement, but when receiving her topical pain relief medications from this writer and [place], is verbally aggressive and abusive to staff, insulting staff intellegence. Pt. Chronic pain being addressed utilizing provider orders. Pt. Reports, "today was just awful, it wasn't good at all". Pt. Reports depression and anxiety is very high. Pt. given PRN medications. Pt. Reports that she needs surgery on her ankles and is very angry that the docotors have not ordered her a surgical consult and MRI to confirm what she is reporting, when verbalize conditions of her ankles. Pt. States her doctor at [facility] has been eagerly awaiting for this to be complete. Pt. Continues to refuse front wheel walker and is having poor compliance with using.     A: Q x 15 minute observation checks were completed for safety. Patient was provided with education, but is resistant to education.  Patient was given/offered medications per orders. Patient  was encourage to attend groups, participate in unit activities and continue with plan of care. Pt. Chart and plans of care reviewed. Pt. Given support and encouragement.   R: Pt. Is complaint with medication this evening, but continues to demand specific times she wants her medications taken and is verbally aggressive with staff.            Precautionary checks every 15 minutes for safety maintained, room free of safety hazards, patient sustains no injury or falls during this shift. Will endorse care to next shift.

## 2017-09-23 NOTE — [facility] Group Notes (Signed)
LCSW Group Therapy Note 09/23/2017 1:15pm  Type of Therapy and Topic: Group Therapy: Feelings Around Returning Home & Establishing a Supportive Framework and Supporting Oneself When Supports Not Available  Participation Level: Did Not Attend  Description of Group:  Patients first processed thoughts and feelings about upcoming discharge. These included fears of upcoming changes, lack of change, new living environments, judgements and expectations from others and overall stigma of mental health issues. The group then discussed the definition of a supportive framework, what that looks and feels like, and how do to discern it from an unhealthy non-supportive network. The group identified different types of supports as well as what to do when your family/friends are less than helpful or unavailable  Therapeutic Goals  1. Patient will identify one healthy supportive network that they can use at discharge. 2. Patient will identify one factor of a supportive framework and how to tell it from an unhealthy network. 3. Patient able to identify one coping skill to use when they do not have positive supports from others. 4. Patient will demonstrate ability to communicate their needs through discussion and/or role plays.  Summary of Patient Progress:  Pt was invited to attend group but chose not to attend. CSW will continue to encourage pt to attend group throughout their admission.    Therapeutic Modalities Cognitive Behavioral Therapy Motivational Interviewing   Angela [name]  [name], LCSW 09/23/2017 10:58 AM

## 2017-09-23 NOTE — Progress Notes (Signed)
Approx. @ 1200 patient presses call bell and alerts nurse to feeling "like I am going to black out." Patient requests ice water and vital signs are obtained. Patient vital signs are WNL. Patient then springs from bed, puts shoes on and runs into bathroom. Patient proceeds to yell and moan. Patient states "I am blacking out, why are you not helping me." Patient then completes using the bathroom and vitals signs are obtained again. Patient questions why vital signs are now elevated. Patient is told that increased physical activity and increased agitated state can raise blood pressure and pulse. Patient is in bed resting.
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
## 2017-09-23 NOTE — Plan of Care (Signed)
  Problem: Self-Concept: Goal: Ability to disclose and discuss suicidal ideas will improve Outcome: Progressing   Problem: Education: Goal: Ability to make informed decisions regarding treatment will improve Outcome: Not Progressing   Problem: Coping: Goal: Coping ability will improve Outcome: Not Progressing   Problem: Self-Concept: Goal: Will verbalize positive feelings about self Outcome: Not Progressing

## 2017-09-23 NOTE — Plan of Care (Signed)
Pt. Is complaint with medication this evening, but continues to demand specific times she wants her medications taken. Pt. Does come up to medication room to take her medications, which is an improvement. Pt. Denies SI/Hi and verbally Is able to contract for safety. Pt. Other then denying SI/HI and contracting for safety, is not able to verbalize any positive feelings. Pt. Reports, "today was just awful, it wasn't good at all".    Problem: Medication: Goal: Compliance with prescribed medication regimen will improve Outcome: Progressing   Problem: Self-Concept: Goal: Ability to disclose and discuss suicidal ideas will improve Outcome: Progressing   Problem: Safety: Goal: Ability to remain free from injury will improve Outcome: Progressing   Problem: Self-Concept: Goal: Will verbalize positive feelings about self Outcome: Not Progressing

## 2017-09-23 NOTE — Plan of Care (Signed)
Angela [name] spent most of the evening in the dayroom with peers. She complained on shift on having loss of phone privileges on previous shift. She reports wanting the follow-up with an advocate. Instructed patient to follow-up with day shift charge nurse. Patient is upset and feels that she was not understood by staff.

## 2017-09-23 NOTE — Progress Notes (Signed)
[facility] MD Progress Note  09/23/2017 10:53 AM Angela [name]  MRN:  [id number] Subjective: Follow-up patient with bipolar disorder.  Patient's complaints today are multiple and Manifold.  Every time he tried to address 1 of them it seems to multiply and into several other complaints and she cannot stay on topic with any of them.  Patient wants to go on at length complaining about her treatment here.  I have reviewed her treatment within the last day and it does not appear that there is been anything inappropriate done.  Patient is being offered the medicine just the way that she had ask for it despite her complaints.  Affect is inappropriate and agitated.  I tried to calm her by listening to her problems and validating them but that did very little.  As soon as I mentioned that she had bipolar disorder she became enraged and threw me out of the room.  Patient is not able to articulate any clear overall plan for what she wants out of her hospitalization. Principal Problem: Bipolar I disorder, current or most recent episode manic, with psychotic features ([place]) Diagnosis:   Patient Active Problem List   Diagnosis Date Noted  . Bipolar I disorder, current or most recent episode manic, with psychotic features ([place]) [F31.2] 09/18/2017  . Agitation [R45.1] 09/18/2017  . IBS (irritable bowel syndrome) [K58.9] 11/17/2016  . Bilateral carpal tunnel syndrome [G56.03] 11/17/2016  . Plantar fasciitis, bilateral [M72.2] 11/17/2016  . Chronic pain syndrome [G89.4] 11/17/2016  . Chronic hyponatremia [E87.1] 11/17/2016  . PTSD (post-traumatic stress disorder) [F43.10] 11/17/2016  . Hernia, hiatal [K44.9] 11/17/2016  . Vitamin D deficiency [E55.9] 11/17/2016  . Vitamin B12 deficiency [E53.8] 11/17/2016  . SIADH (syndrome of inappropriate ADH production) ([place]) [E22.2] 09/08/2016  . Cervical spondylosis with myelopathy [M47.12] 05/24/2016  . Post-concussion headache [G44.309] 05/24/2016  . MCI (mild cognitive  impairment) with memory loss [G31.84] 03/30/2016  . Gait abnormality [R26.9] 03/30/2016  . Chronic bipolar affective disorder ([place]) [F31.9] 01/24/2016  . Anemia, iron deficiency [D50.9] 06/24/2015  . Endometriosis [N80.9] 06/24/2015  . Essential hypertension [I10] 06/24/2015  . History of postmenopausal bleeding [Z87.42] 05/22/2015  . History of TIA (transient ischemic attack) [Z86.73] 04/20/2015  . Hypothyroidism [E03.9] 04/20/2015  . GERD (gastroesophageal reflux disease) [K21.9] 04/20/2015  . Cervical disc disorder with radiculopathy [M50.10] 03/25/2015  . Chronic neck pain [M54.2, G89.29] 03/25/2015  . Pelvic pain in female [R10.2] 10/30/2014  . History of skin cancer [Z85.828] 06/30/2014  . Chronic headaches [R51] 12/16/2013  . Neuropathy [G62.9] 12/16/2013   Total Time spent with patient: 20 minutes  Past Psychiatric History: Long history of mood instability behavior problems  Past Medical History:  Past Medical History:  Diagnosis Date  . Allergic rhinitis   . Anemia   . Blurred vision   . Depression   . Diverticulosis   . Endometriosis   . Falls   . GERD (gastroesophageal reflux disease)   . Hernia, hiatal   . Hypothyroidism   . IBS (irritable bowel syndrome)   . Malignant neoplasm of skin   . Migraine   . Neuropathy   . PTSD (post-traumatic stress disorder)   . Thyroid disease     Past Surgical History:  Procedure Laterality Date  . APPENDECTOMY    . CHOLECYSTECTOMY    . CHOLECYSTECTOMY, LAPAROSCOPIC    . ESOPHAGOGASTRODUODENOSCOPY (EGD) WITH PROPOFOL N/A 03/29/2015   Procedure: ESOPHAGOGASTRODUODENOSCOPY (EGD) WITH PROPOFOL;  Surgeon: Josefine Class, MD;  Location[facility] ENDOSCOPY;  Service: Endoscopy;  Laterality: N/A;  . HERNIA REPAIR    . laproscopy    . TONSILLECTOMY    . uteral suspension     Family History:  Family History  Problem Relation Age of Onset  . Heart disease Father   . Cancer Mother        lung  . Urolithiasis Neg Hx   .  Kidney disease Neg Hx   . Kidney cancer Neg Hx   . Prostate cancer Neg Hx    Family Psychiatric  History: See previous note Social History:  Social History   Substance and Sexual Activity  Alcohol Use No     Social History   Substance and Sexual Activity  Drug Use No    Social History   Socioeconomic History  . Marital status: Single    Spouse name: Not on file  . Number of children: 0  . Years of education: 1.5 years of college  . Highest education level: Not on file  Occupational History  . Occupation: Retired  Scientific laboratory technician  . Financial resource strain: Not on file  . Food insecurity:    Worry: Not on file    Inability: Not on file  . Transportation needs:    Medical: Not on file    Non-medical: Not on file  Tobacco Use  . Smoking status: Former Smoker    Last attempt to quit: 11/17/1976    Years since quitting: 40.8  . Smokeless tobacco: Never Used  Substance and Sexual Activity  . Alcohol use: No  . Drug use: No  . Sexual activity: Not Currently  Lifestyle  . Physical activity:    Days per week: Not on file    Minutes per session: Not on file  . Stress: Not on file  Relationships  . Social connections:    Talks on phone: Not on file    Gets together: Not on file    Attends religious service: Not on file    Active member of club or organization: Not on file    Attends meetings of clubs or organizations: Not on file    Relationship status: Not on file  Other Topics Concern  . Not on file  Social History Narrative   Lives at home alone.   Right-handed.   No daily caffeine use.   Additional Social History:    Pain Medications: See PTA Prescriptions: See PTA Over the Counter: See PTA History of alcohol / drug use?: No history of alcohol / drug abuse Longest period of sobriety (when/how long): Reports of none Negative Consequences of Use: (n/a) Withdrawal Symptoms: (n/a)                    Sleep: Poor  Appetite:  Fair  Current  Medications: Current Facility-Administered Medications  Medication Dose Route Frequency Provider Last Rate Last Dose  . acetaminophen (TYLENOL) tablet 650 mg  650 mg Oral Q6H PRN Clapacs, Madie Reno, MD   650 mg at 09/22/17 1745  . alum & mag hydroxide-simeth (MAALOX/MYLANTA) 200-200-20 MG/5ML suspension 30 mL  30 mL Oral Q4H PRN Clapacs, John T, MD      . amLODipine (NORVASC) tablet 5 mg  5 mg Oral BID Clapacs, Madie Reno, MD   5 mg at 09/23/17 1015  . calcium carbonate (TUMS - dosed in mg elemental calcium) chewable tablet 500 mg of elemental calcium  500 mg of elemental calcium Oral BID WC Pucilowska, Jolanta B, MD   500 mg of elemental calcium at 09/23/17  1012  . cholecalciferol (VITAMIN D) tablet 1,000 Units  1,000 Units Oral Daily Pucilowska, Jolanta B, MD   1,000 Units at 09/23/17 1014  . cycloSPORINE (RESTASIS) 0.05 % ophthalmic emulsion 1 drop  1 drop Both Eyes BID Clapacs, Madie Reno, MD   1 drop at 09/23/17 1016  . diclofenac sodium (VOLTAREN) 1 % transdermal gel 4 g  4 g Topical QID Pucilowska, Jolanta B, MD   4 g at 09/23/17 1016  . feeding supplement (ENSURE ENLIVE) (ENSURE ENLIVE) liquid 237 mL  237 mL Oral TID BM Pucilowska, Jolanta B, MD   237 mL at 09/23/17 0951  . ferrous sulfate tablet 325 mg  325 mg Oral Q breakfast Pucilowska, Jolanta B, MD   325 mg at 09/23/17 1013  . gabapentin (NEURONTIN) capsule 100 mg  100 mg Oral BID Clapacs, John T, MD   100 mg at 09/23/17 1015  . hydrOXYzine (ATARAX/VISTARIL) tablet 50 mg  50 mg Oral TID PRN Clapacs, John T, MD      . levothyroxine (SYNTHROID, LEVOTHROID) tablet 100 mcg  100 mcg Oral QAC breakfast Clapacs, Madie Reno, MD   100 mcg at 09/23/17 1015  . lidocaine (LIDODERM) 5 % 1 patch  1 patch Transdermal Q24H Pucilowska, Jolanta B, MD   1 patch at 09/22/17 1715  . linaclotide (LINZESS) capsule 290 mcg  290 mcg Oral QAC breakfast Clapacs, Madie Reno, MD   290 mcg at 09/23/17 1014  . LORazepam (ATIVAN) tablet 0.5 mg  0.5 mg Oral Q6H PRN Clapacs, John T, MD    0.5 mg at 09/22/17 1423  . magnesium hydroxide (MILK OF MAGNESIA) suspension 30 mL  30 mL Oral Daily PRN Clapacs, Madie Reno, MD   30 mL at 09/22/17 0920  . magnesium oxide (MAG-OX) tablet 200 mg  200 mg Oral BID Pucilowska, Jolanta B, MD   200 mg at 09/23/17 1013  . multivitamin with minerals tablet 1 tablet  1 tablet Oral Daily Pucilowska, Jolanta B, MD   1 tablet at 09/23/17 1013  . omega-3 acid ethyl esters (LOVAZA) capsule 1 g  1 g Oral BID Pucilowska, Jolanta B, MD   1 g at 09/23/17 1014  . pantoprazole (PROTONIX) EC tablet 40 mg  40 mg Oral Daily Pucilowska, Jolanta B, MD   40 mg at 09/23/17 1015  . PARoxetine (PAXIL-CR) 24 hr tablet 12.5 mg  12.5 mg Oral Daily Pucilowska, Jolanta B, MD   12.5 mg at 09/23/17 1015  . prazosin (MINIPRESS) capsule 2 mg  2 mg Oral BID Pucilowska, Jolanta B, MD   2 mg at 09/23/17 1014  . tiZANidine (ZANAFLEX) tablet 2 mg  2 mg Oral Q8H PRN Pucilowska, Jolanta B, MD      . traZODone (DESYREL) tablet 50 mg  50 mg Oral QHS Pucilowska, Jolanta B, MD   50 mg at 09/21/17 2214    Lab Results: No results found for this or any previous visit (from the past 48 hour(s)).  Blood Alcohol level:  Lab Results  Component Value Date   ETH <5 09/29/2016   ETH <5 25/05/3974    Metabolic Disorder Labs: Lab Results  Component Value Date   HGBA1C 5.7 (H) 09/20/2017   MPG 116.89 09/20/2017   No results found for: PROLACTIN Lab Results  Component Value Date   CHOL 173 09/20/2017   TRIG 193 (H) 09/20/2017   HDL 50 09/20/2017   CHOLHDL 3.5 09/20/2017   VLDL 39 09/20/2017   LDLCALC 84 09/20/2017   LDLCALC  87 09/04/2017    Physical Findings: AIMS:  , ,  ,  ,    CIWA:    COWS:     Musculoskeletal: Strength & Muscle Tone: within normal limits Gait & Station: normal Patient leans: N/A  Psychiatric Specialty Exam: Physical Exam  Nursing note and vitals reviewed. Constitutional: She appears well-developed and well-nourished.  HENT:  Head: Normocephalic and  atraumatic.  Eyes: Pupils are equal, round, and reactive to light. Conjunctivae are normal.  Neck: Normal range of motion.  Cardiovascular: Regular rhythm and normal heart sounds.  Respiratory: Effort normal. No respiratory distress.  GI: Soft.  Musculoskeletal: Normal range of motion.  Neurological: She is alert.  Skin: Skin is warm and dry.  Psychiatric: Her affect is angry, labile and inappropriate. Her speech is tangential. She is agitated. She is not aggressive. Thought content is paranoid and delusional. Cognition and memory are impaired. She expresses impulsivity and inappropriate judgment.    Review of Systems  Constitutional: Negative.   HENT: Negative.   Eyes: Negative.   Respiratory: Negative.   Cardiovascular: Negative.   Gastrointestinal: Negative.   Musculoskeletal: Positive for joint pain.  Skin: Negative.   Neurological: Negative.   Psychiatric/Behavioral: Positive for depression. Negative for hallucinations, memory loss, substance abuse and suicidal ideas. The patient is nervous/anxious and has insomnia.     Blood pressure 113/60, pulse (!) 53, temperature 97.6 F (36.4 C), temperature source Oral, resp. rate 17, height 5\' 3"  (1.6 m), weight 55.8 kg, SpO2 100 %.Body mass index is 21.79 kg/m.  General Appearance: Disheveled  Eye Contact:  Minimal  Speech:  Pressured  Volume:  Increased  Mood:  Angry and Irritable  Affect:  Congruent, Inappropriate and Labile  Thought Process:  Disorganized  Orientation:  Full (Time, Place, and Person)  Thought Content:  Illogical, Paranoid Ideation and Rumination  Suicidal Thoughts:  No  Homicidal Thoughts:  No  Memory:  Immediate;   Fair Recent;   Poor Remote;   Poor  Judgement:  Impaired  Insight:  Shallow  Psychomotor Activity:  Decreased  Concentration:  Concentration: Poor  Recall:  Poor  Fund of Knowledge:  Fair  Language:  Good  Akathisia:  No  Handed:  Right  AIMS (if indicated):     Assets:  Resilience   ADL's:  Impaired  Cognition:  Impaired,  Mild  Sleep:  Number of Hours: 3.75     Treatment Plan Summary: Daily contact with patient to assess and evaluate symptoms and progress in treatment, Medication management and Plan Patient is sleeping poorly.  Agitated.  Unable to engage in any kind of lucid treatment plan.  Unable to make any progress towards discharge.  Strongly recommended to her that she needs to be back on some sort of mood stabilizing or antipsychotic medicine which just led her to become more agitated with me.  Case reviewed and situation reviewed with nursing staff.  Primary treatment team will follow up tomorrow.  Alethia Berthold, MD 09/23/2017, 10:53 AM

## 2017-09-23 NOTE — Progress Notes (Signed)
Patient did not complete a self inventory sheet today. Patient denies SI/HI/AVH. Patient has minimal interactions with this nurse during morning assessment. Patient is offered medication multiple times. Patient is religious preoccupied. Patient's safety is maintained on the unit. Patient will be encouraged to participate in unit activities and prescribed medication and treatment.

## 2017-09-24 MED ORDER — PAROXETINE HCL ER 12.5 MG PO TB24
25.0000 mg | ORAL_TABLET | Freq: Every day | ORAL | Status: DC
  Administered 2017-09-25 – 2017-10-14 (×20): 25 mg via ORAL
  Filled 2017-09-24 (×20): qty 2

## 2017-09-24 MED ORDER — ARIPIPRAZOLE 5 MG PO TABS
5.0000 mg | ORAL_TABLET | Freq: Every day | ORAL | Status: DC
  Administered 2017-09-25 – 2017-09-26 (×2): 5 mg via ORAL
  Filled 2017-09-24 (×3): qty 1

## 2017-09-24 MED ORDER — CARBAMAZEPINE 200 MG PO TABS
200.0000 mg | ORAL_TABLET | Freq: Two times a day (BID) | ORAL | Status: DC
  Administered 2017-09-24 – 2017-09-26 (×4): 200 mg via ORAL
  Filled 2017-09-24 (×4): qty 1

## 2017-09-24 NOTE — Progress Notes (Signed)
Chaplain paged by patient's nurse, requesting a visit. Upon arrival to the room, [name] was resting in her bed with the lights on. She arose and acknowledged the chaplain; we met during her first day in the ED on 9/5. She indicated that she was feeling down, unloved, unwanted, and generally unhappy. Patient shared some of her distress about an upcoming auction of her personal belongings, need for permanent housing, loneliness, and general frustration with her circumstances. Patient requested prayer and she and chaplain prayed together for provision, healing and wellness, and friends and loved ones, etc. [name] was interested in talking and sharing the concerns on heart; chaplain engaged in active and reflective listening. Patient expressed need to "retrain the brain" as relates to negative patterns of thinking and that sometimes that  is difficult when you spend much time alone. Patient expressed gratitude for the visit and indicated that she felt much better afterwards.     09/24/17 1400  Clinical Encounter Type  Visited With Patient  Visit Type Follow-up;Spiritual support  Referral From Nurse  Spiritual Encounters  Spiritual Needs Prayer;Emotional

## 2017-09-24 NOTE — Progress Notes (Signed)
[facility] MD Progress Note  09/25/2017 4:27 PM Angela [name]  MRN:  [id number]  Subjective:   Angela [name] is very upset and unpleasant today. She has been very rude to her nurse. She again complained of eye infection, vaginal yeast infection and side effects of medications that were started yesterday. She has nausea. She refuses to take them anymore.  At the same time she complains of severe depression and suicidal thoughts with a plan to overdose on medications. She agrees to continue on Paxil for depression and anxiety for which she likes Ativan PRN and Minipress. She opposes any mood stabilizers or antipsychotics.    Principal Problem: Bipolar I disorder, current or most recent episode manic, with psychotic features ([place]) Diagnosis:   Patient Active Problem List   Diagnosis Date Noted  . Bipolar I disorder, current or most recent episode manic, with psychotic features ([place]) [F31.2] 09/18/2017    Priority: High  . Agitation [R45.1] 09/18/2017  . IBS (irritable bowel syndrome) [K58.9] 11/17/2016  . Bilateral carpal tunnel syndrome [G56.03] 11/17/2016  . Plantar fasciitis, bilateral [M72.2] 11/17/2016  . Chronic pain syndrome [G89.4] 11/17/2016  . Chronic hyponatremia [E87.1] 11/17/2016  . PTSD (post-traumatic stress disorder) [F43.10] 11/17/2016  . Hernia, hiatal [K44.9] 11/17/2016  . Vitamin D deficiency [E55.9] 11/17/2016  . Vitamin B12 deficiency [E53.8] 11/17/2016  . SIADH (syndrome of inappropriate ADH production) ([place]) [E22.2] 09/08/2016  . Cervical spondylosis with myelopathy [M47.12] 05/24/2016  . Post-concussion headache [G44.309] 05/24/2016  . MCI (mild cognitive impairment) with memory loss [G31.84] 03/30/2016  . Gait abnormality [R26.9] 03/30/2016  . Chronic bipolar affective disorder ([place]) [F31.9] 01/24/2016  . Anemia, iron deficiency [D50.9] 06/24/2015  . Endometriosis [N80.9] 06/24/2015  . Essential hypertension [I10] 06/24/2015  . History of postmenopausal bleeding  [Z87.42] 05/22/2015  . History of TIA (transient ischemic attack) [Z86.73] 04/20/2015  . Hypothyroidism [E03.9] 04/20/2015  . GERD (gastroesophageal reflux disease) [K21.9] 04/20/2015  . Cervical disc disorder with radiculopathy [M50.10] 03/25/2015  . Chronic neck pain [M54.2, G89.29] 03/25/2015  . Pelvic pain in female [R10.2] 10/30/2014  . History of skin cancer [Z85.828] 06/30/2014  . Chronic headaches [R51] 12/16/2013  . Neuropathy [G62.9] 12/16/2013   Total Time spent with patient: 20 minutes  Past Psychiatric History: bipolar disorder  Past Medical History:  Past Medical History:  Diagnosis Date  . Allergic rhinitis   . Anemia   . Blurred vision   . Depression   . Diverticulosis   . Endometriosis   . Falls   . GERD (gastroesophageal reflux disease)   . Hernia, hiatal   . Hypothyroidism   . IBS (irritable bowel syndrome)   . Malignant neoplasm of skin   . Migraine   . Neuropathy   . PTSD (post-traumatic stress disorder)   . Thyroid disease     Past Surgical History:  Procedure Laterality Date  . APPENDECTOMY    . CHOLECYSTECTOMY    . CHOLECYSTECTOMY, LAPAROSCOPIC    . ESOPHAGOGASTRODUODENOSCOPY (EGD) WITH PROPOFOL N/A 03/29/2015   Procedure: ESOPHAGOGASTRODUODENOSCOPY (EGD) WITH PROPOFOL;  Surgeon: Josefine Class, MD;  Location: [facility] ENDOSCOPY;  Service: Endoscopy;  Laterality: N/A;  . HERNIA REPAIR    . laproscopy    . TONSILLECTOMY    . uteral suspension     Family History:  Family History  Problem Relation Age of Onset  . Heart disease Father   . Cancer Mother        lung  . Urolithiasis Neg Hx   . Kidney disease Neg  Hx   . Kidney cancer Neg Hx   . Prostate cancer Neg Hx    Family Psychiatric  History: bipolar, suicides Social History:  Social History   Substance and Sexual Activity  Alcohol Use No     Social History   Substance and Sexual Activity  Drug Use No    Social History   Socioeconomic History  . Marital status: Single     Spouse name: Not on file  . Number of children: 0  . Years of education: 1.5 years of college  . Highest education level: Not on file  Occupational History  . Occupation: Retired  Scientific laboratory technician  . Financial resource strain: Not on file  . Food insecurity:    Worry: Not on file    Inability: Not on file  . Transportation needs:    Medical: Not on file    Non-medical: Not on file  Tobacco Use  . Smoking status: Former Smoker    Last attempt to quit: 11/17/1976    Years since quitting: 40.8  . Smokeless tobacco: Never Used  Substance and Sexual Activity  . Alcohol use: No  . Drug use: No  . Sexual activity: Not Currently  Lifestyle  . Physical activity:    Days per week: Not on file    Minutes per session: Not on file  . Stress: Not on file  Relationships  . Social connections:    Talks on phone: Not on file    Gets together: Not on file    Attends religious service: Not on file    Active member of club or organization: Not on file    Attends meetings of clubs or organizations: Not on file    Relationship status: Not on file  Other Topics Concern  . Not on file  Social History Narrative   Lives at home alone.   Right-handed.   No daily caffeine use.   Additional Social History:    Pain Medications: See PTA Prescriptions: See PTA Over the Counter: See PTA History of alcohol / drug use?: No history of alcohol / drug abuse Longest period of sobriety (when/how long): Reports of none Negative Consequences of Use: (n/a) Withdrawal Symptoms: (n/a)                    Sleep: Fair  Appetite:  Fair  Current Medications: Current Facility-Administered Medications  Medication Dose Route Frequency Provider Last Rate Last Dose  . acetaminophen (TYLENOL) tablet 650 mg  650 mg Oral Q6H PRN Clapacs, Madie Reno, MD   650 mg at 09/22/17 1745  . alum & mag hydroxide-simeth (MAALOX/MYLANTA) 200-200-20 MG/5ML suspension 30 mL  30 mL Oral Q4H PRN Clapacs, Madie Reno, MD   30 mL at  09/24/17 2237  . amLODipine (NORVASC) tablet 5 mg  5 mg Oral BID Clapacs, Madie Reno, MD   5 mg at 09/23/17 1015  . ARIPiprazole (ABILIFY) tablet 5 mg  5 mg Oral Daily Pucilowska, Jolanta B, MD   5 mg at 09/25/17 0903  . calcium carbonate (TUMS - dosed in mg elemental calcium) chewable tablet 500 mg of elemental calcium  500 mg of elemental calcium Oral BID WC Pucilowska, Jolanta B, MD   500 mg of elemental calcium at 09/25/17 0906  . carbamazepine (TEGRETOL) tablet 200 mg  200 mg Oral BID AC & HS Pucilowska, Jolanta B, MD   200 mg at 09/25/17 0908  . cholecalciferol (VITAMIN D) tablet 1,000 Units  1,000 Units Oral Daily Pucilowska,  Wardell Honour, MD   1,000 Units at 09/25/17 0904  . cycloSPORINE (RESTASIS) 0.05 % ophthalmic emulsion 1 drop  1 drop Both Eyes BID Clapacs, Madie Reno, MD   1 drop at 09/25/17 0909  . diclofenac sodium (VOLTAREN) 1 % transdermal gel 4 g  4 g Topical QID Pucilowska, Jolanta B, MD   4 g at 09/25/17 1405  . feeding supplement (ENSURE ENLIVE) (ENSURE ENLIVE) liquid 237 mL  237 mL Oral TID BM Pucilowska, Jolanta B, MD   237 mL at 09/23/17 1957  . ferrous sulfate tablet 325 mg  325 mg Oral Q breakfast Pucilowska, Jolanta B, MD   325 mg at 09/25/17 0904  . gabapentin (NEURONTIN) capsule 100 mg  100 mg Oral BID Clapacs, Madie Reno, MD   100 mg at 09/25/17 0903  . hydrOXYzine (ATARAX/VISTARIL) tablet 50 mg  50 mg Oral TID PRN Clapacs, John T, MD      . levothyroxine (SYNTHROID, LEVOTHROID) tablet 100 mcg  100 mcg Oral QAC breakfast Clapacs, Madie Reno, MD   100 mcg at 09/25/17 0909  . lidocaine (LIDODERM) 5 % 1 patch  1 patch Transdermal Q24H Pucilowska, Jolanta B, MD   1 patch at 09/24/17 1456  . linaclotide (LINZESS) capsule 290 mcg  290 mcg Oral QAC breakfast Clapacs, Madie Reno, MD   290 mcg at 09/25/17 0910  . LORazepam (ATIVAN) tablet 0.5 mg  0.5 mg Oral Q6H PRN Clapacs, Madie Reno, MD   0.5 mg at 09/24/17 1631  . magnesium hydroxide (MILK OF MAGNESIA) suspension 30 mL  30 mL Oral Daily PRN Clapacs,  Madie Reno, MD   30 mL at 09/22/17 0920  . magnesium oxide (MAG-OX) tablet 200 mg  200 mg Oral BID Pucilowska, Jolanta B, MD   200 mg at 09/25/17 0910  . multivitamin with minerals tablet 1 tablet  1 tablet Oral Daily Pucilowska, Jolanta B, MD   1 tablet at 09/25/17 0903  . omega-3 acid ethyl esters (LOVAZA) capsule 1 g  1 g Oral BID Pucilowska, Jolanta B, MD   1 g at 09/25/17 0911  . pantoprazole (PROTONIX) EC tablet 40 mg  40 mg Oral Daily Pucilowska, Jolanta B, MD   40 mg at 09/25/17 0905  . PARoxetine (PAXIL-CR) 24 hr tablet 25 mg  25 mg Oral Daily Pucilowska, Jolanta B, MD   25 mg at 09/25/17 0911  . prazosin (MINIPRESS) capsule 2 mg  2 mg Oral BID Pucilowska, Jolanta B, MD   2 mg at 09/25/17 0904  . promethazine (PHENERGAN) tablet 25 mg  25 mg Oral Q6H PRN Pucilowska, Jolanta B, MD      . tiZANidine (ZANAFLEX) tablet 2 mg  2 mg Oral Q8H PRN Pucilowska, Jolanta B, MD      . traZODone (DESYREL) tablet 50 mg  50 mg Oral QHS Pucilowska, Jolanta B, MD   50 mg at 09/24/17 2155    Lab Results: No results found for this or any previous visit (from the past 48 hour(s)).  Blood Alcohol level:  Lab Results  Component Value Date   ETH <5 09/29/2016   ETH <5 74/08/1446    Metabolic Disorder Labs: Lab Results  Component Value Date   HGBA1C 5.7 (H) 09/20/2017   MPG 116.89 09/20/2017   No results found for: PROLACTIN Lab Results  Component Value Date   CHOL 173 09/20/2017   TRIG 193 (H) 09/20/2017   HDL 50 09/20/2017   CHOLHDL 3.5 09/20/2017   VLDL 39 09/20/2017  LDLCALC 84 09/20/2017   LDLCALC 87 09/04/2017    Physical Findings: AIMS:  , ,  ,  ,    CIWA:    COWS:     Musculoskeletal: Strength & Muscle Tone: within normal limits Gait & Station: normal Patient leans: N/A  Psychiatric Specialty Exam: Physical Exam  Nursing note and vitals reviewed. Psychiatric: Her affect is angry, labile and inappropriate. Her speech is rapid and/or pressured and tangential. She is agitated and  hyperactive. Thought content is paranoid and delusional. Cognition and memory are normal. She expresses impulsivity. She expresses suicidal ideation.    Review of Systems  Musculoskeletal: Positive for back pain and joint pain.  Neurological: Negative.   Psychiatric/Behavioral: Positive for suicidal ideas.  All other systems reviewed and are negative.   Blood pressure 116/60, pulse (!) 52, temperature 97.9 F (36.6 C), temperature source Oral, resp. rate 16, height 5\' 3"  (1.6 m), weight 55.8 kg, SpO2 100 %.Body mass index is 21.79 kg/m.  General Appearance: Casual  Eye Contact:  Good  Speech:  Clear and Coherent and Pressured  Volume:  Increased  Mood:  Angry, Dysphoric and Irritable  Affect:  Congruent, Inappropriate and Labile  Thought Process:  Disorganized, Irrelevant and Descriptions of Associations: Loose  Orientation:  Full (Time, Place, and Person)  Thought Content:  Illogical, Delusions and Paranoid Ideation  Suicidal Thoughts:  Yes.  with intent/plan  Homicidal Thoughts:  No  Memory:  Immediate;   Fair Recent;   Fair Remote;   Fair  Judgement:  Poor  Insight:  Lacking  Psychomotor Activity:  Increased  Concentration:  Concentration: Poor and Attention Span: Poor  Recall:  Poor  Fund of Knowledge:  Fair  Language:  Fair  Akathisia:  No  Handed:  Right  AIMS (if indicated):     Assets:  Communication Skills Desire for Improvement Financial Resources/Insurance Resilience  ADL's:  Intact  Cognition:  WNL  Sleep:  Number of Hours: 6     Treatment Plan Summary: Daily contact with patient to assess and evaluate symptoms and progress in treatment and Medication management   Angela [name] is a 67 year old female with a history of bipolar disorder and PTSD admitted for aggressive behavior at the group home.  #Agitation, still agitated several times a day  #Mood -continue Paxil to 25 mg daily -continue Tegretol 200 mg BID -add Phenergan 25 mg PRN -continue oral  Abilify 5 mg with a plan to give Abilify maintena -continue Neurontin 100 mg BID -Trazodone 100 mg nightly   #Anxiety of PTSD type -continue Minipress 2 mg nightly -continue Ativan 0.5 mg TID PRN  #HTN -Norvasc 5 mg daily, she refused today  #Hypothyroidism -Synthroid 100 ug daily  #Constipation -Linzess  #Dry eye -Restasis -there is no evidence of infection  #Foot injury -wearing braces -believes she needs MRIof both ankles -per her [facility] orthopedist note, she will need it in preparation for surgery WHEN her living situation is stable  #Carpal tunel -wearing braces  #Fall risk -PT consult is appreciated  #Weight loss -Ensure TID  #Vaginal yeast infection -completed 3 day course of Diflucan  #Labs -lipid panel and TSH are normal, A1C 5.7 -EKG reviewed, sinus bradycardia OF 52 with QTc 398  #Social -patient desires independent living -she demands assistance with even smallest tasks here -OT consult will be greatly appreciated  #Disposition -[facility]BE -there is no discharge plan at present -she will not be tolerated at any facility as long as she is manic  Orson Slick, MD  09/25/2017, 4:27 PM
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
## 2017-09-24 NOTE — Progress Notes (Signed)
Recreation Therapy Notes  Date: 09/24/2017  Time: 9:30 am   Location: Craft room   Behavioral response: N/A   Intervention Topic: Communication  Discussion/Intervention: Patient did not attend group.   Clinical Observations/Feedback:  Patient did not attend group.   Sani Loiseau LRT/CTRS        Lester Crickenberger 09/24/2017 10:26 AM

## 2017-09-25 MED ORDER — PANTOPRAZOLE SODIUM 40 MG PO TBEC
40.0000 mg | DELAYED_RELEASE_TABLET | Freq: Every day | ORAL | Status: DC
  Administered 2017-09-26 – 2017-10-15 (×20): 40 mg via ORAL
  Filled 2017-09-25 (×23): qty 1

## 2017-09-25 MED ORDER — LEVOTHYROXINE SODIUM 100 MCG PO TABS
100.0000 ug | ORAL_TABLET | Freq: Every day | ORAL | Status: DC
  Administered 2017-09-26 – 2017-11-06 (×38): 100 ug via ORAL
  Filled 2017-09-25 (×47): qty 1

## 2017-09-25 MED ORDER — PROMETHAZINE HCL 25 MG PO TABS
25.0000 mg | ORAL_TABLET | Freq: Four times a day (QID) | ORAL | Status: DC | PRN
  Administered 2017-09-25 – 2017-09-26 (×2): 25 mg via ORAL
  Filled 2017-09-25 (×2): qty 1

## 2017-09-25 MED ORDER — LINACLOTIDE 290 MCG PO CAPS
290.0000 ug | ORAL_CAPSULE | Freq: Every day | ORAL | Status: DC
  Administered 2017-09-26 – 2017-11-06 (×30): 290 ug via ORAL
  Filled 2017-09-25 (×42): qty 1

## 2017-09-25 NOTE — [facility] Group Notes (Signed)
09/25/2017 1PM  Type of Therapy/Topic:  Group Therapy:  Feelings about Diagnosis  Participation Level:  Did Not Attend   Description of Group:   This group will allow patients to explore their thoughts and feelings about diagnoses they have received. Patients will be guided to explore their level of understanding and acceptance of these diagnoses. Facilitator will encourage patients to process their thoughts and feelings about the reactions of others to their diagnosis and will guide patients in identifying ways to discuss their diagnosis with significant others in their lives. This group will be process-oriented, with patients participating in exploration of their own experiences, giving and receiving support, and processing challenge from other group members.   Therapeutic Goals: 1. Patient will demonstrate understanding of diagnosis as evidenced by identifying two or more symptoms of the disorder 2. Patient will be able to express two feelings regarding the diagnosis 3. Patient will demonstrate their ability to communicate their needs through discussion and/or role play  Summary of Patient Progress: Patient was encouraged and invited to attend group. Patient did not attend group. Social worker will continue to encourage group participation in the future.        Therapeutic Modalities:   Cognitive Behavioral Therapy Brief Therapy Feelings Identification    Darin Engels, [place] 09/25/2017 2:44 PM

## 2017-09-25 NOTE — Plan of Care (Signed)
Patient working on Biomedical scientist  but waffling back and forth   Also  not wanting to take her medication at appropriate times   Denies suicidal  ideations  . 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 Problem: Education: Goal: Ability to make informed decisions regarding treatment will improve 09/25/2017 1413 by Leodis Liverpool, RN Outcome: Progressing 09/25/2017 1411 by Leodis Liverpool, RN Outcome: Progressing   Problem: Medication: Goal: Compliance with prescribed medication regimen will improve 09/25/2017 1413 by Leodis Liverpool, RN Outcome: Progressing 09/25/2017 1411 by Leodis Liverpool, RN Outcome: Progressing   Problem: Self-Concept: Goal: Ability to disclose and discuss suicidal ideas will improve 09/25/2017 1413 by Leodis Liverpool, RN Outcome: Progressing 09/25/2017 1411 by Leodis Liverpool, RN Outcome: Progressing   Problem: Safety: Goal: Ability to remain free from injury will improve 09/25/2017 1413 by Leodis Liverpool, RN Outcome: Progressing 09/25/2017 1411 by Leodis Liverpool, RN Outcome: Progressing

## 2017-09-25 NOTE — Progress Notes (Signed)
Chaplain received page for patient. Chaplain arrived and nurse gave details that patient was exhausted and had several things going on. Nurse escorted Chaplain to patient room and introduced her. Chaplain allow patient to share her thoughts and provided emotional support and prayer. Chaplain believes patient was a some type of peace before she left.

## 2017-09-25 NOTE — [facility] Group Notes (Signed)
[facility] Group Notes:  (Nursing/MHT/Case Management/Adjunct)  Date:  09/25/2017  Time:  10:08 PM  Type of Therapy:  Group Therapy  Participation Level:  Did Not Attend     [name] 09/25/2017, 10:08 PM

## 2017-09-25 NOTE — Progress Notes (Signed)
Patient this morning refuses vital signs. Pt. Agitated and reports just wanting to sleep and not be disturbed.

## 2017-09-25 NOTE — Progress Notes (Signed)
Recreation Therapy Notes  Date: 09/24/2017  Time: 9:30 pm   Location: Craft Room   Behavioral response: N/A   Intervention Topic: Problem Solving  Discussion/Intervention: Patient did not attend group.   Clinical Observations/Feedback:  Patient did not attend group.   Stephens Shreve LRT/CTRS        Felice Hope 09/25/2017 11:25 AM

## 2017-09-25 NOTE — Plan of Care (Signed)
Pt. Complaint with medications with encouragement and direction from staff, but still continues to be argumentative and verbally aggressive with staff trying to provide care for her. Pt. Denies SI/Hi and verbally will contract for safety.    Problem: Medication: Goal: Compliance with prescribed medication regimen will improve Outcome: Progressing   Problem: Self-Concept: Goal: Ability to disclose and discuss suicidal ideas will improve Outcome: Progressing   Problem: Safety: Goal: Ability to remain free from injury will improve Outcome: Progressing

## 2017-09-25 NOTE — Progress Notes (Signed)
D: Pt. Continues to be verbally aggressive and talk down towards staff attempting to care for her. Pt. Frequently needs to be reminded of appropriate behaviors and limit setting interventions applied. Pt. Continues to have poor compliance with front wheel walker for safety. Pt. Attitude is still very negative and reports, "today was of coarse just aweful!", "I'm always hurting and in excruciating pain, my eyes are infected, red, and swollen, and I can't get any of my physical problems address it's all just a nightmare!". Pt. Eyes assessed and are WDL. Pt. Chronic pain being address with provider orders. Pt. Presentation is frequently irritable and agitated and overall very labile.     A: Q x 15 minute observation checks were completed for safety. Patient was provided with education, but refuses education  Patient was given/offered medications per orders. Patient  was encourage to attend groups, participate in unit activities and continue with plan of care. Pt. Chart and plans of care reviewed. Pt. Given support and encouragement.   R: Pt. Complaint with medications with encouragement and direction from staff, but still continues to be argumentative and verbally aggressive with staff trying to provide care for her.             Precautionary checks every 15 minutes for safety maintained, room free of safety hazards, patient sustains no injury or falls during this shift. Will endorse care to next shift.

## 2017-09-25 NOTE — Progress Notes (Signed)
D: Pt denies SI/HI/AVH, still able to contract for safety. Pt. Continues to present verbally aggressive and demanding towards staff and hyperfocus on physical pains and disease processes she wants to be addressed she is confident she has. Pt. Assessed per shift to reassure patient and vss. Pt. Continues to report nothing is helping her pain and it is always "10/10". Pt. Reports today has been, "absolutely awful", "nothing has changed it's horrible, what do you think!". Pt. Refuses to rate depression and or anxiety. Pt. Presents frequently with irritability, agitations, and anxiety. Pt. Denies need for PRN medications to help her calm down frequently. Pt. Reports nausea, given PRN medications with reported relief. Pt. Continues to be not complaint with safety measures to prevent falls. Pt. Not utilizing front wheel walker as directed for safety. Pt. Is to see OT tomorrow. Pt. Reports she is running a fever, but temperature taken with temp. WDL.    A: Q x 15 minute observation checks were completed for safety. Patient was provided with education, but refuses educations.  Patient was given/offered medications per orders. Patient  was encourage to attend groups, participate in unit activities and continue with plan of care. Pt. Chart and plans of care reviewed. Pt. Given support and encouragement.   R: Patient is complaint with medication and unit procedures, but requires heavy redirection from agitations and irritability for compliance.              Precautionary checks every 15 minutes for safety maintained, room free of safety hazards, patient sustains no injury or falls during this shift. Will endorse care to next shift.

## 2017-09-25 NOTE — Progress Notes (Signed)
[facility] MD Progress Note  09/26/2017 2:28 PM Angela [name]  MRN:  [id number]  Subjective:    Angela [name] is utterly hopeless today. She sees no point of pressing on if her She took Tegretol last night and, again complains of nausea and vomiting. Received Phenergan this morning but has been gagging during the interview. Her mood is sad and she has no energy to argue with me. She was unable to chose her menu due to lack of concentration. We did it together but she really does not care. Slept better with and did not have any nightmares. She credits Minipress, the only medication she approves of. Talks about benefits of ECT in the past. She it appropriately groomed but looks tired and sad.  Principal Problem: Bipolar I disorder, most recent episode mixed, severe with psychotic features ([place]) Diagnosis:   Patient Active Problem List   Diagnosis Date Noted  . Bipolar I disorder, most recent episode mixed, severe with psychotic features ([place]) [F31.64] 09/18/2017    Priority: High  . Agitation [R45.1] 09/18/2017  . IBS (irritable bowel syndrome) [K58.9] 11/17/2016  . Bilateral carpal tunnel syndrome [G56.03] 11/17/2016  . Plantar fasciitis, bilateral [M72.2] 11/17/2016  . Chronic pain syndrome [G89.4] 11/17/2016  . Chronic hyponatremia [E87.1] 11/17/2016  . PTSD (post-traumatic stress disorder) [F43.10] 11/17/2016  . Hernia, hiatal [K44.9] 11/17/2016  . Vitamin D deficiency [E55.9] 11/17/2016  . Vitamin B12 deficiency [E53.8] 11/17/2016  . SIADH (syndrome of inappropriate ADH production) ([place]) [E22.2] 09/08/2016  . Cervical spondylosis with myelopathy [M47.12] 05/24/2016  . Post-concussion headache [G44.309] 05/24/2016  . MCI (mild cognitive impairment) with memory loss [G31.84] 03/30/2016  . Gait abnormality [R26.9] 03/30/2016  . Chronic bipolar affective disorder ([place]) [F31.9] 01/24/2016  . Anemia, iron deficiency [D50.9] 06/24/2015  . Endometriosis [N80.9] 06/24/2015  . Essential  hypertension [I10] 06/24/2015  . History of postmenopausal bleeding [Z87.42] 05/22/2015  . History of TIA (transient ischemic attack) [Z86.73] 04/20/2015  . Hypothyroidism [E03.9] 04/20/2015  . GERD (gastroesophageal reflux disease) [K21.9] 04/20/2015  . Cervical disc disorder with radiculopathy [M50.10] 03/25/2015  . Chronic neck pain [M54.2, G89.29] 03/25/2015  . Pelvic pain in female [R10.2] 10/30/2014  . History of skin cancer [Z85.828] 06/30/2014  . Chronic headaches [R51] 12/16/2013  . Neuropathy [G62.9] 12/16/2013   Total Time spent with patient: 20 minutes  Past Psychiatric History: bipolar disorder  Past Medical History:  Past Medical History:  Diagnosis Date  . Allergic rhinitis   . Anemia   . Blurred vision   . Depression   . Diverticulosis   . Endometriosis   . Falls   . GERD (gastroesophageal reflux disease)   . Hernia, hiatal   . Hypothyroidism   . IBS (irritable bowel syndrome)   . Malignant neoplasm of skin   . Migraine   . Neuropathy   . PTSD (post-traumatic stress disorder)   . Thyroid disease     Past Surgical History:  Procedure Laterality Date  . APPENDECTOMY    . CHOLECYSTECTOMY    . CHOLECYSTECTOMY, LAPAROSCOPIC    . ESOPHAGOGASTRODUODENOSCOPY (EGD) WITH PROPOFOL N/A 03/29/2015   Procedure: ESOPHAGOGASTRODUODENOSCOPY (EGD) WITH PROPOFOL;  Surgeon: Josefine Class, MD;  Location[facility] ENDOSCOPY;  Service: Endoscopy;  Laterality: N/A;  . HERNIA REPAIR    . laproscopy    . TONSILLECTOMY    . uteral suspension     Family History:  Family History  Problem Relation Age of Onset  . Heart disease Father   . Cancer Mother  lung  . Urolithiasis Neg Hx   . Kidney disease Neg Hx   . Kidney cancer Neg Hx   . Prostate cancer Neg Hx    Family Psychiatric  History: bipolar, multiple suicides Social History:  Social History   Substance and Sexual Activity  Alcohol Use No     Social History   Substance and Sexual Activity  Drug Use No     Social History   Socioeconomic History  . Marital status: Single    Spouse name: Not on file  . Number of children: 0  . Years of education: 1.5 years of college  . Highest education level: Not on file  Occupational History  . Occupation: Retired  Scientific laboratory technician  . Financial resource strain: Not on file  . Food insecurity:    Worry: Not on file    Inability: Not on file  . Transportation needs:    Medical: Not on file    Non-medical: Not on file  Tobacco Use  . Smoking status: Former Smoker    Last attempt to quit: 11/17/1976    Years since quitting: 40.8  . Smokeless tobacco: Never Used  Substance and Sexual Activity  . Alcohol use: No  . Drug use: No  . Sexual activity: Not Currently  Lifestyle  . Physical activity:    Days per week: Not on file    Minutes per session: Not on file  . Stress: Not on file  Relationships  . Social connections:    Talks on phone: Not on file    Gets together: Not on file    Attends religious service: Not on file    Active member of club or organization: Not on file    Attends meetings of clubs or organizations: Not on file    Relationship status: Not on file  Other Topics Concern  . Not on file  Social History Narrative   Lives at home alone.   Right-handed.   No daily caffeine use.   Additional Social History:    Pain Medications: See PTA Prescriptions: See PTA Over the Counter: See PTA History of alcohol / drug use?: No history of alcohol / drug abuse Longest period of sobriety (when/how long): Reports of none Negative Consequences of Use: (n/a) Withdrawal Symptoms: (n/a)                    Sleep: Fair  Appetite:  Poor  Current Medications: Current Facility-Administered Medications  Medication Dose Route Frequency Provider Last Rate Last Dose  . acetaminophen (TYLENOL) tablet 650 mg  650 mg Oral Q6H PRN Clapacs, Madie Reno, MD   650 mg at 09/25/17 2027  . alum & mag hydroxide-simeth (MAALOX/MYLANTA) 200-200-20  MG/5ML suspension 30 mL  30 mL Oral Q4H PRN Clapacs, John T, MD   30 mL at 09/25/17 1749  . amLODipine (NORVASC) tablet 5 mg  5 mg Oral BID Clapacs, Madie Reno, MD   5 mg at 09/23/17 1015  . ARIPiprazole (ABILIFY) tablet 5 mg  5 mg Oral Daily Pucilowska, Jolanta B, MD   5 mg at 09/26/17 1005  . calcium carbonate (TUMS - dosed in mg elemental calcium) chewable tablet 500 mg of elemental calcium  500 mg of elemental calcium Oral BID WC Pucilowska, Jolanta B, MD   500 mg of elemental calcium at 09/26/17 1009  . carbamazepine (TEGRETOL) tablet 200 mg  200 mg Oral BID AC & HS Pucilowska, Jolanta B, MD   200 mg at 09/26/17 1007  .  cholecalciferol (VITAMIN D) tablet 1,000 Units  1,000 Units Oral Daily Pucilowska, Jolanta B, MD   1,000 Units at 09/26/17 1006  . cycloSPORINE (RESTASIS) 0.05 % ophthalmic emulsion 1 drop  1 drop Both Eyes BID Clapacs, Madie Reno, MD   1 drop at 09/26/17 0854  . diclofenac sodium (VOLTAREN) 1 % transdermal gel 4 g  4 g Topical QID Pucilowska, Jolanta B, MD   4 g at 09/26/17 1242  . ferrous sulfate tablet 325 mg  325 mg Oral Q breakfast Pucilowska, Jolanta B, MD   325 mg at 09/26/17 1008  . gabapentin (NEURONTIN) capsule 100 mg  100 mg Oral BID Clapacs, Madie Reno, MD   100 mg at 09/26/17 1005  . hydrOXYzine (ATARAX/VISTARIL) tablet 50 mg  50 mg Oral TID PRN Clapacs, John T, MD      . levothyroxine (SYNTHROID, LEVOTHROID) tablet 100 mcg  100 mcg Oral QAC breakfast Pucilowska, Jolanta B, MD   100 mcg at 09/26/17 0607  . lidocaine (LIDODERM) 5 % 1 patch  1 patch Transdermal Q24H Pucilowska, Jolanta B, MD   1 patch at 09/26/17 0909  . linaclotide (LINZESS) capsule 290 mcg  290 mcg Oral QAC breakfast Pucilowska, Jolanta B, MD   290 mcg at 09/26/17 0607  . LORazepam (ATIVAN) tablet 0.5 mg  0.5 mg Oral Q6H PRN Clapacs, Madie Reno, MD   0.5 mg at 09/24/17 1631  . magnesium hydroxide (MILK OF MAGNESIA) suspension 30 mL  30 mL Oral Daily PRN Clapacs, Madie Reno, MD   30 mL at 09/22/17 0920  . magnesium oxide  (MAG-OX) tablet 200 mg  200 mg Oral BID Pucilowska, Jolanta B, MD   200 mg at 09/26/17 1010  . multivitamin with minerals tablet 1 tablet  1 tablet Oral Daily Pucilowska, Jolanta B, MD   1 tablet at 09/26/17 1005  . omega-3 acid ethyl esters (LOVAZA) capsule 1 g  1 g Oral BID Pucilowska, Jolanta B, MD   1 g at 09/26/17 1011  . ondansetron (ZOFRAN-ODT) disintegrating tablet 4 mg  4 mg Oral Q8H PRN Pucilowska, Jolanta B, MD      . pantoprazole (PROTONIX) EC tablet 40 mg  40 mg Oral Daily Pucilowska, Jolanta B, MD   40 mg at 09/26/17 0607  . PARoxetine (PAXIL-CR) 24 hr tablet 25 mg  25 mg Oral Daily Pucilowska, Jolanta B, MD   25 mg at 09/26/17 1004  . prazosin (MINIPRESS) capsule 2 mg  2 mg Oral BID Pucilowska, Jolanta B, MD   2 mg at 09/26/17 1006  . tiZANidine (ZANAFLEX) tablet 2 mg  2 mg Oral Q8H PRN Pucilowska, Jolanta B, MD      . traZODone (DESYREL) tablet 50 mg  50 mg Oral QHS Pucilowska, Jolanta B, MD   50 mg at 09/25/17 2110    Lab Results: No results found for this or any previous visit (from the past 48 hour(s)).  Blood Alcohol level:  Lab Results  Component Value Date   ETH <5 09/29/2016   ETH <5 24/09/7351    Metabolic Disorder Labs: Lab Results  Component Value Date   HGBA1C 5.7 (H) 09/20/2017   MPG 116.89 09/20/2017   No results found for: PROLACTIN Lab Results  Component Value Date   CHOL 173 09/20/2017   TRIG 193 (H) 09/20/2017   HDL 50 09/20/2017   CHOLHDL 3.5 09/20/2017   VLDL 39 09/20/2017   LDLCALC 84 09/20/2017   LDLCALC 87 09/04/2017    Physical Findings: AIMS:  , ,  ,  ,  CIWA:    COWS:     Musculoskeletal: Strength & Muscle Tone: within normal limits Gait & Station: normal Patient leans: N/A  Psychiatric Specialty Exam: Physical Exam  Nursing note and vitals reviewed. Psychiatric: Her affect is angry, labile and inappropriate. Her speech is rapid and/or pressured and tangential. She is hyperactive. Thought content is paranoid and delusional.  Cognition and memory are normal. She expresses impulsivity. She expresses suicidal ideation. She expresses suicidal plans.    Review of Systems  Neurological: Negative.   Psychiatric/Behavioral: Positive for depression and suicidal ideas. The patient is nervous/anxious.   All other systems reviewed and are negative.   Blood pressure 139/69, pulse (!) 54, temperature 98.6 F (37 C), temperature source Oral, resp. rate 18, height 5\' 3"  (1.6 m), weight 55.8 kg, SpO2 95 %.Body mass index is 21.79 kg/m.  General Appearance: Casual  Eye Contact:  Good  Speech:  Pressured  Volume:  Increased  Mood:  Angry, Dysphoric and Irritable  Affect:  Congruent  Thought Process:  Disorganized, Irrelevant and Descriptions of Associations: Tangential  Orientation:  Full (Time, Place, and Person)  Thought Content:  Delusions and Paranoid Ideation  Suicidal Thoughts:  Yes.  with intent/plan  Homicidal Thoughts:  No  Memory:  Immediate;   Fair Recent;   Fair Remote;   Fair  Judgement:  Poor  Insight:  Lacking  Psychomotor Activity:  Increased  Concentration:  Concentration: Fair and Attention Span: Fair  Recall:  [name]:  Fair  Language:  Fair  Akathisia:  No  Handed:  Right  AIMS (if indicated):     Assets:  Communication Skills Desire for Improvement Financial Resources/Insurance Resilience  ADL's:  Intact  Cognition:  WNL  Sleep:  Number of Hours: 6.15     Treatment Plan Summary: Daily contact with patient to assess and evaluate symptoms and progress in treatment and Medication management   Angela [name] is a 67 year old female with a history of bipolar disorder and PTSD admitted for aggressive behavior at the group home. She now displays symptoms of depression with hopelessness and passive suicidal thoughts. She now accepts medications even though, she complains of nausea.   #Unstable and depressed mood, not improving -continue Paxil to 25 mg daily -continue Tegretol  200 mg BID -discontinue Phenargan due yo sedation, start Zofran PRN -continue oral Abilify 5 mg with a plan to give Abilify maintena -continue Neurontin 100 mg BID -Trazodone 100 mg nightly   #ECT -ECT consult is appreciated -patient witl start treatment on Fri  #Anxiety of PTSD type -continueMinipress 2 mg nightly -continue Ativan 0.5 mg TID PRN  #HTN, BP stable   #Hypothyroidism -Synthroid 100 ug daily  #Constipation -Linzess  #Dry eye -Restasis -there is no evidence of infection  #Foot injury -wearing braces -believes she needs MRIof both ankles -per her [facility] orthopedist note, she will need it in preparation for surgery WHEN her living situation is stable  #Carpal tunel -wearingbraces  #Fall risk -PT consultis appreciated  #Weight loss -Ensure TID  #Vaginal yeast infection -completed 3 day course of Diflucan  #Labs -lipid panel and TSH are normal, A1C 5.7 -EKG reviewed, sinus bradycardia OF 52 with QTc 398  #Social -patient desires independent living -she demands assistance with even smallest tasks here -OT consult will be greatly appreciated  #Disposition -TBE -there is no discharge plan at present -she will not be tolerated at any facility as long as she is manic  Orson Slick, MD 09/26/2017, 2:28 PM

## 2017-09-25 NOTE — Progress Notes (Signed)
Patient  constantly having concerns around  Herself . Would not comply with vital signs this am . Writer preceded to room for set . Patient refused to get up for breakfast.  Voice of not being able to walk , requested a wheelchair . 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 Instructed patient writer attempting to asses if  She could do this in a alone setting . Patient  Noted to get upset  About this . Question  Patient about her boyfriend . Patient got very upset . Stated  Probation officer didn't write her back  When she wrote  To the hospital . Upset  about the death of boyfriend and father Patient working on decision making  skills  but waffling back and forth   Also  not wanting to take her medication at appropriate times   Denies suicidal  ideations  . 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 Appropriate ADL'S.Limited interacting   with peers and staff.  A: Encourage patient participation with unit programming . Instruction  Given on  Medication , verbalize understanding. Writer notified chaplin  For grief issues R: Voice no other concerns. Staff continue to monitor

## 2017-09-25 NOTE — Consult Note (Signed)
ECT: Saw patient at the request of Dr. Bary Leriche and the patient herself.  Patient well known see previous notes.  67 year old woman with long-standing mood instability.  Today she presents as very sad and tearful.  Says she feels completely hopeless.  Asking for ECT.  Staff tells me that earlier today however she was still agitated and angry frequently.  Still having a lot of mood instability.  Patient tells me that she had ECT when she was in her 60s and got a lot of benefit from it.  Thinks that it would be helpful for her now.  We quickly went over some of the practicalities and she is in agreement.  I will review her chart but tentatively plan if she is still feeling bad and wants treatment that we can start on Friday.

## 2017-09-25 NOTE — Progress Notes (Signed)
This encounter was created in error - please disregard.

## 2017-09-25 NOTE — Plan of Care (Signed)
Pt. Partially compliant with medications. Pt. Continues to give direction and demands to staff on when and how she will receive her medications. Pt. Still able to deny si/hi and contracts for safety. Pt. Is not able to verbalize any positive feelings about her care or emotional well-being. Pt. Attitude is negative and irritable.    Problem: Medication: Goal: Compliance with prescribed medication regimen will improve Outcome: Progressing   Problem: Self-Concept: Goal: Ability to disclose and discuss suicidal ideas will improve Outcome: Progressing   Problem: Safety: Goal: Ability to remain free from injury will improve Outcome: Progressing   Problem: Medication: Goal: Compliance with prescribed medication regimen will improve Outcome: Progressing   Problem: Self-Concept: Goal: Ability to disclose and discuss suicidal ideas will improve Outcome: Progressing   Problem: Safety: Goal: Ability to remain free from injury will improve Outcome: Progressing   Problem: Self-Concept: Goal: Will verbalize positive feelings about self Outcome: Not Progressing

## 2017-09-26 MED ORDER — ONDANSETRON 4 MG PO TBDP
4.0000 mg | ORAL_TABLET | Freq: Three times a day (TID) | ORAL | Status: DC | PRN
  Administered 2017-09-26 – 2017-10-08 (×3): 4 mg via ORAL
  Filled 2017-09-26 (×3): qty 1

## 2017-09-26 NOTE — Plan of Care (Signed)
Patient working on Biomedical scientist  but  Continue waffling back and forth   get upset when confronted  yells at staff Improvement  on  medication at appropriate times   Denies suicidal  ideations  . 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   Problem: Coping: Goal: Coping ability will improve Outcome: Progressing   Problem: Health Behavior/Discharge Planning: Goal: Identification of resources available to assist in meeting health care needs will improve Outcome: Progressing   Problem: Medication: Goal: Compliance with prescribed medication regimen will improve Outcome: Progressing   Problem: Self-Concept: Goal: Ability to disclose and discuss suicidal ideas will improve Outcome: Progressing Goal: Will verbalize positive feelings about self Outcome: Progressing   Problem: Safety: Goal: Ability to remain free from injury will improve Outcome: Progressing
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
## 2017-09-26 NOTE — Progress Notes (Addendum)
[name] MD Progress Note  09/27/2017 4:47 PM Angela [name]  MRN:  [id number]  Subjective:    Angela [name] is very irritable, feels depressed and utterly hopeless. She is now worried about receiving ECT as she feels physically sick. She reports diarrhea for several days, she vomited yesterday in Angela day room, feels that her temperature is elevated "for her". Yesterday she blamed nausea on Tegretol. She is no longer on tegretol and this morning, she refused multiple medications.   In Angela afternoon she continues to complain of "dehydration" and "flu". She requested PRN blood pressure medication, changed her mind about Minipress. Angela [name] had interesting interaction with Angela chaplain, see note. Angela [name] has been complaining until we fulfill her wish but which makes her move to a new one. We were unable to get far with kindness or with firmness.   Principal Problem: Bipolar I disorder, most recent episode mixed, severe with psychotic features ([name]) Diagnosis:   [name] Active Problem List   Diagnosis Date Noted  . Bipolar I disorder, most recent episode mixed, severe with psychotic features ([name]) [F31.64] 09/18/2017    Priority: High  . Agitation [R45.1] 09/18/2017  . IBS (irritable bowel syndrome) [K58.9] 11/17/2016  . Bilateral carpal tunnel syndrome [G56.03] 11/17/2016  . Plantar fasciitis, bilateral [M72.2] 11/17/2016  . Chronic pain syndrome [G89.4] 11/17/2016  . Chronic hyponatremia [E87.1] 11/17/2016  . PTSD (post-traumatic stress disorder) [F43.10] 11/17/2016  . Hernia, hiatal [K44.9] 11/17/2016  . Vitamin D deficiency [E55.9] 11/17/2016  . Vitamin B12 deficiency [E53.8] 11/17/2016  . SIADH (syndrome of inappropriate ADH production) ([name]) [E22.2] 09/08/2016  . Cervical spondylosis with myelopathy [M47.12] 05/24/2016  . Post-concussion headache [G44.309] 05/24/2016  . MCI (mild cognitive impairment) with memory loss [G31.84] 03/30/2016  . Gait abnormality [R26.9] 03/30/2016   . Chronic bipolar affective disorder ([name]) [F31.9] 01/24/2016  . Anemia, iron deficiency [D50.9] 06/24/2015  . Endometriosis [N80.9] 06/24/2015  . Essential hypertension [I10] 06/24/2015  . History of postmenopausal bleeding [Z87.42] 05/22/2015  . History of TIA (transient ischemic attack) [Z86.73] 04/20/2015  . Hypothyroidism [E03.9] 04/20/2015  . GERD (gastroesophageal reflux disease) [K21.9] 04/20/2015  . Cervical disc disorder with radiculopathy [M50.10] 03/25/2015  . Chronic neck pain [M54.2, G89.29] 03/25/2015  . Pelvic pain in female [R10.2] 10/30/2014  . History of skin cancer [Z85.828] 06/30/2014  . Chronic headaches [R51] 12/16/2013  . Neuropathy [G62.9] 12/16/2013   Total Time spent with [name]: 15 minutes  Past Psychiatric History: bipolar disorder  Past Medical History:  Past Medical History:  Diagnosis Date  . Allergic rhinitis   . Anemia   . Blurred vision   . Depression   . Diverticulosis   . Endometriosis   . Falls   . GERD (gastroesophageal reflux disease)   . Hernia, hiatal   . Hypothyroidism   . IBS (irritable bowel syndrome)   . Malignant neoplasm of skin   . Migraine   . Neuropathy   . PTSD (post-traumatic stress disorder)   . Thyroid disease     Past Surgical History:  Procedure Laterality Date  . APPENDECTOMY    . CHOLECYSTECTOMY    . CHOLECYSTECTOMY, LAPAROSCOPIC    . ESOPHAGOGASTRODUODENOSCOPY (EGD) WITH PROPOFOL N/A 03/29/2015   Procedure: ESOPHAGOGASTRODUODENOSCOPY (EGD) WITH PROPOFOL;  Surgeon: Josefine Class, MD;  Location[facility] ENDOSCOPY;  Service: Endoscopy;  Laterality: N/A;  . HERNIA REPAIR    . laproscopy    . TONSILLECTOMY    . uteral suspension     Family History:  Family  History  Problem Relation Age of Onset  . Heart disease Father   . Cancer Mother        lung  . Urolithiasis Neg Hx   . Kidney disease Neg Hx   . Kidney cancer Neg Hx   . Prostate cancer Neg Hx    Family Psychiatric  History: bipolar and  multiple suicides Social History:  Social History   Substance and Sexual Activity  Alcohol Use No     Social History   Substance and Sexual Activity  Drug Use No    Social History   Socioeconomic History  . Marital status: Single    Spouse name: Not on file  . Number of children: 0  . Years of education: 1.5 years of college  . Highest education level: Not on file  Occupational History  . Occupation: Retired  Scientific laboratory technician  . Financial resource strain: Not on file  . Food insecurity:    Worry: Not on file    Inability: Not on file  . Transportation needs:    Medical: Not on file    Non-medical: Not on file  Tobacco Use  . Smoking status: Former Smoker    Last attempt to quit: 11/17/1976    Years since quitting: 40.8  . Smokeless tobacco: Never Used  Substance and Sexual Activity  . Alcohol use: No  . Drug use: No  . Sexual activity: Not Currently  Lifestyle  . Physical activity:    Days per week: Not on file    Minutes per session: Not on file  . Stress: Not on file  Relationships  . Social connections:    Talks on phone: Not on file    Gets together: Not on file    Attends religious service: Not on file    Active member of club or organization: Not on file    Attends meetings of clubs or organizations: Not on file    Relationship status: Not on file  Other Topics Concern  . Not on file  Social History Narrative   Lives at home alone.   Right-handed.   No daily caffeine use.   Additional Social History:    Pain Medications: See PTA Prescriptions: See PTA Over Angela Counter: See PTA History of alcohol / drug use?: No history of alcohol / drug abuse Longest period of sobriety (when/how long): Reports of none Negative Consequences of Use: (n/a) Withdrawal Symptoms: (n/a)                    Sleep: Fair  Appetite:  Poor  Current Medications: Current Facility-Administered Medications  Medication Dose Route Frequency Provider Last Rate Last  Dose  . acetaminophen (TYLENOL) tablet 650 mg  650 mg Oral Q6H PRN Clapacs, Madie Reno, MD   650 mg at 09/27/17 1605  . alum & mag hydroxide-simeth (MAALOX/MYLANTA) 200-200-20 MG/5ML suspension 30 mL  30 mL Oral Q4H PRN Clapacs, Madie Reno, MD   30 mL at 09/26/17 2207  . amLODipine (NORVASC) tablet 5 mg  5 mg Oral BID Clapacs, Madie Reno, MD   5 mg at 09/27/17 1609  . calcium carbonate (TUMS - dosed in mg elemental calcium) chewable tablet 500 mg of elemental calcium  500 mg of elemental calcium Oral BID WC Fleet Higham B, MD   500 mg of elemental calcium at 09/27/17 1612  . cholecalciferol (VITAMIN D) tablet 1,000 Units  1,000 Units Oral Daily Andreika Vandagriff B, MD   1,000 Units at 09/27/17 0814  .  cycloSPORINE (RESTASIS) 0.05 % ophthalmic emulsion 1 drop  1 drop Both Eyes BID Clapacs, Madie Reno, MD   1 drop at 09/27/17 1605  . diclofenac sodium (VOLTAREN) 1 % transdermal gel 4 g  4 g Topical QID Donnie Panik B, MD   4 g at 09/27/17 1134  . ferrous sulfate tablet 325 mg  325 mg Oral Q breakfast Tira Lafferty B, MD   325 mg at 09/27/17 0815  . gabapentin (NEURONTIN) capsule 100 mg  100 mg Oral BID Clapacs, John T, MD   100 mg at 09/27/17 1614  . hydrOXYzine (ATARAX/VISTARIL) tablet 50 mg  50 mg Oral TID PRN Clapacs, John T, MD      . levothyroxine (SYNTHROID, LEVOTHROID) tablet 100 mcg  100 mcg Oral QAC breakfast Mateo Overbeck B, MD   100 mcg at 09/27/17 0656  . lidocaine (LIDODERM) 5 % 1 patch  1 patch Transdermal Q24H Claribel Sachs B, MD   1 patch at 09/26/17 0909  . linaclotide (LINZESS) capsule 290 mcg  290 mcg Oral QAC breakfast Aleida Crandell B, MD   290 mcg at 09/27/17 0656  . LORazepam (ATIVAN) tablet 0.5 mg  0.5 mg Oral Q6H PRN Clapacs, Madie Reno, MD   0.5 mg at 09/26/17 2204  . magnesium hydroxide (MILK OF MAGNESIA) suspension 30 mL  30 mL Oral Daily PRN Clapacs, Madie Reno, MD   30 mL at 09/22/17 0920  . magnesium oxide (MAG-OX) tablet 200 mg  200 mg Oral BID Dajahnae Vondra,  Linkon Siverson B, MD   200 mg at 09/27/17 0816  . multivitamin with minerals tablet 1 tablet  1 tablet Oral Daily Jayren Cease B, MD   1 tablet at 09/27/17 0814  . omega-3 acid ethyl esters (LOVAZA) capsule 1 g  1 g Oral BID Johnpatrick Jenny B, MD   1 g at 09/27/17 0816  . ondansetron (ZOFRAN-ODT) disintegrating tablet 4 mg  4 mg Oral Q8H PRN Kionte Baumgardner B, MD   4 mg at 09/27/17 1605  . pantoprazole (PROTONIX) EC tablet 40 mg  40 mg Oral Daily Torrie Lafavor B, MD   40 mg at 09/27/17 0656  . PARoxetine (PAXIL-CR) 24 hr tablet 25 mg  25 mg Oral Daily Annaka Cleaver B, MD   25 mg at 09/27/17 0817  . prazosin (MINIPRESS) capsule 2 mg  2 mg Oral BID Lacole Komorowski B, MD      . tiZANidine (ZANAFLEX) tablet 2 mg  2 mg Oral Q8H PRN Chan Sheahan B, MD      . traZODone (DESYREL) tablet 50 mg  50 mg Oral QHS Latria Mccarron B, MD   50 mg at 09/26/17 2204    Lab Results: No results found for this or any previous visit (from Angela past 48 hour(s)).  Blood Alcohol level:  Lab Results  Component Value Date   ETH <5 09/29/2016   ETH <5 14/43/1540    Metabolic Disorder Labs: Lab Results  Component Value Date   HGBA1C 5.7 (H) 09/20/2017   MPG 116.89 09/20/2017   No results found for: PROLACTIN Lab Results  Component Value Date   CHOL 173 09/20/2017   TRIG 193 (H) 09/20/2017   HDL 50 09/20/2017   CHOLHDL 3.5 09/20/2017   VLDL 39 09/20/2017   LDLCALC 84 09/20/2017   LDLCALC 87 09/04/2017    Physical Findings: AIMS:  , ,  ,  ,    CIWA:    COWS:     Musculoskeletal: Strength & Muscle Tone: decreased  Gait & Station: unsteady [name] leans: N/A  Psychiatric Specialty Exam: Physical Exam  Nursing note and vitals reviewed. Psychiatric: Her speech is normal. Her affect is labile. She is agitated. Thought content is paranoid and delusional. Cognition and memory are impaired. She expresses impulsivity.    Review of Systems  Gastrointestinal: Positive for  abdominal pain, diarrhea, nausea and vomiting.  Neurological: Negative.   Psychiatric/Behavioral: Positive for depression.  All other systems reviewed and are negative.   Blood pressure (!) 156/104, pulse 63, temperature 98.3 F (36.8 C), temperature source Oral, resp. rate 16, height 5\' 3"  (1.6 m), weight 55.8 kg, SpO2 96 %.Body mass index is 21.79 kg/m.  General Appearance: Casual  Eye Contact:  Good  Speech:  Clear and Coherent  Volume:  Increased  Mood:  Depressed and Irritable  Affect:  Congruent  Thought Process:  Goal Directed and Descriptions of Associations: Intact  Orientation:  Full (Time, Place, and Person)  Thought Content:  Illogical, Delusions, Paranoid Ideation and Rumination  Suicidal Thoughts:  Yes.  without intent/plan  Homicidal Thoughts:  No  Memory:  Immediate;   Fair Recent;   Fair Remote;   Fair  Judgement:  Impaired  Insight:  Lacking  Psychomotor Activity:  Decreased  Concentration:  Concentration: Fair and Attention Span: Fair  Recall:  [name]:  Fair  Language:  Fair  Akathisia:  No  Handed:  Right  AIMS (if indicated):     Assets:  Communication Skills Desire for Improvement Financial Resources/Insurance Resilience  ADL's:  Intact  Cognition:  WNL  Sleep:  Number of Hours: 6     Treatment Plan Summary: Daily contact with [name] to assess and evaluate symptoms and progress in treatment and Medication management   Angela [name] is a 67 year old female with a history of bipolar disorder and PTSD admitted for aggressive behavior at [facility] group home. She now displays symptoms of depression with hopelessness and passive suicidal thoughts. She refused medications.    #Unstable and depressed mood, not improving -continuePaxil to 25 mg daily -discontinue Tegretol, [name] refusing -continue Zofran PRN -discontinue Abilify, [name] refusing -continue Neurontin 100 mg BID -Trazodone 100 mg nightly   #ECT -ECT consult is  appreciated -[name] will start treatment tomorrow  #Anxiety of PTSD type -restart Minipress for nightmares and flashbacks, [name] refused this morning but changes her mind since -continue Ativan 0.5 mg TID PRN  #HTN, BP stable   #Hypothyroidism -Synthroid 100 ug daily  #Constipation -Linzess  #Dry eye -Restasis -there is no evidence of infection  #Foot injury -wearing braces -believes sheneeds MRIof both ankles -per her [facility] orthopedist note, she will need itin preparation for surgeryWHENher living situation is stable  #Carpal tunel -wearingbraces  #Fall risk -PT consultis appreciated  #Weight loss -Ensure TID  #Vaginal yeast infection -completed 3 day course of Diflucan  #Labs -lipid panel and TSH are normal, A1C 5.7 -EKG reviewed, sinus bradycardia OF 52 with QTc 398  #Social -[name] desires independent living -she demands assistance with even smallest tasks here -OT consult is appreciated, [name] unfit to live independently  #Disposition -TBE -there is no discharge plan at present -she will not be tolerated at any facility as long as she is manic    Orson Slick, MD 09/27/2017, 4:47 PM

## 2017-09-26 NOTE — Progress Notes (Signed)
D: Patient stated slept good last night .Stated appetite is good and energy level  Is normal. Stated concentration is good . Stated on Depression scale , hopeless and anxiety .( low 0-10 high) Denies suicidal  homicidal ideations  .  No auditory hallucinations  No pain concerns . Appropriate ADL'S. Interacting with peers and staff. Patient working on Chief Financial Officer but  Continue waffling back and forth  get upset when confronted  yells at staffImprovement  on  medication at appropriate times Denies suicidal ideations . Working Patent examiner . Patient has a negative out look on life  Patient  Worked  with  Occupational therapy this shift .  Informed  Writer of 1 diarrhea like stool this am ,. Noted to throw  Up in dayroom  At dinner time  Escorted to room . A: Encourage patient participation with unit programming . Instruction  Given on  Medication , verbalize understanding. R: Voice no other concerns. Staff continue to monitor

## 2017-09-26 NOTE — Tx Team (Signed)
Interdisciplinary Treatment and Diagnostic Plan Update  09/26/2017 Time of Session: [address] MRN: [id number]  Principal Diagnosis: Bipolar I disorder, most recent episode mixed, severe with psychotic features ([place])  Secondary Diagnoses: Principal Problem:   Bipolar I disorder, most recent episode mixed, severe with psychotic features ([place]) Active Problems:   Hypothyroidism   Essential hypertension   Plantar fasciitis, bilateral   PTSD (post-traumatic stress disorder)   Agitation   Current Medications:  Current Facility-Administered Medications  Medication Dose Route Frequency Provider Last Rate Last Dose  . acetaminophen (TYLENOL) tablet 650 mg  650 mg Oral Q6H PRN Clapacs, Madie Reno, MD   650 mg at 09/25/17 2027  . alum & mag hydroxide-simeth (MAALOX/MYLANTA) 200-200-20 MG/5ML suspension 30 mL  30 mL Oral Q4H PRN Clapacs, John T, MD   30 mL at 09/25/17 1749  . amLODipine (NORVASC) tablet 5 mg  5 mg Oral BID Clapacs, Madie Reno, MD   5 mg at 09/23/17 1015  . ARIPiprazole (ABILIFY) tablet 5 mg  5 mg Oral Daily Pucilowska, Jolanta B, MD   5 mg at 09/26/17 1005  . calcium carbonate (TUMS - dosed in mg elemental calcium) chewable tablet 500 mg of elemental calcium  500 mg of elemental calcium Oral BID WC Pucilowska, Jolanta B, MD   500 mg of elemental calcium at 09/26/17 1009  . carbamazepine (TEGRETOL) tablet 200 mg  200 mg Oral BID AC & HS Pucilowska, Jolanta B, MD   200 mg at 09/26/17 1007  . cholecalciferol (VITAMIN D) tablet 1,000 Units  1,000 Units Oral Daily Pucilowska, Jolanta B, MD   1,000 Units at 09/26/17 1006  . cycloSPORINE (RESTASIS) 0.05 % ophthalmic emulsion 1 drop  1 drop Both Eyes BID Clapacs, Madie Reno, MD   1 drop at 09/26/17 1616  . diclofenac sodium (VOLTAREN) 1 % transdermal gel 4 g  4 g Topical QID Pucilowska, Jolanta B, MD   4 g at 09/26/17 1616  . ferrous sulfate tablet 325 mg  325 mg Oral Q breakfast Pucilowska, Jolanta B, MD   325 mg at 09/26/17 1008  .  gabapentin (NEURONTIN) capsule 100 mg  100 mg Oral BID Clapacs, Madie Reno, MD   100 mg at 09/26/17 1005  . hydrOXYzine (ATARAX/VISTARIL) tablet 50 mg  50 mg Oral TID PRN Clapacs, John T, MD      . levothyroxine (SYNTHROID, LEVOTHROID) tablet 100 mcg  100 mcg Oral QAC breakfast Pucilowska, Jolanta B, MD   100 mcg at 09/26/17 0607  . lidocaine (LIDODERM) 5 % 1 patch  1 patch Transdermal Q24H Pucilowska, Jolanta B, MD   1 patch at 09/26/17 0909  . linaclotide (LINZESS) capsule 290 mcg  290 mcg Oral QAC breakfast Pucilowska, Jolanta B, MD   290 mcg at 09/26/17 0607  . LORazepam (ATIVAN) tablet 0.5 mg  0.5 mg Oral Q6H PRN Clapacs, Madie Reno, MD   0.5 mg at 09/24/17 1631  . magnesium hydroxide (MILK OF MAGNESIA) suspension 30 mL  30 mL Oral Daily PRN Clapacs, Madie Reno, MD   30 mL at 09/22/17 0920  . magnesium oxide (MAG-OX) tablet 200 mg  200 mg Oral BID Pucilowska, Jolanta B, MD   200 mg at 09/26/17 1010  . multivitamin with minerals tablet 1 tablet  1 tablet Oral Daily Pucilowska, Jolanta B, MD   1 tablet at 09/26/17 1005  . omega-3 acid ethyl esters (LOVAZA) capsule 1 g  1 g Oral BID Pucilowska, Jolanta B, MD   1 g  at 09/26/17 1011  . ondansetron (ZOFRAN-ODT) disintegrating tablet 4 mg  4 mg Oral Q8H PRN Pucilowska, Jolanta B, MD      . pantoprazole (PROTONIX) EC tablet 40 mg  40 mg Oral Daily Pucilowska, Jolanta B, MD   40 mg at 09/26/17 0607  . PARoxetine (PAXIL-CR) 24 hr tablet 25 mg  25 mg Oral Daily Pucilowska, Jolanta B, MD   25 mg at 09/26/17 1004  . prazosin (MINIPRESS) capsule 2 mg  2 mg Oral BID Pucilowska, Jolanta B, MD   2 mg at 09/26/17 1006  . tiZANidine (ZANAFLEX) tablet 2 mg  2 mg Oral Q8H PRN Pucilowska, Jolanta B, MD      . traZODone (DESYREL) tablet 50 mg  50 mg Oral QHS Pucilowska, Jolanta B, MD   50 mg at 09/25/17 2110   PTA Medications: Medications Prior to Admission  Medication Sig Dispense Refill Last Dose  . amLODipine (NORVASC) 5 MG tablet Take 1 tablet (5 mg total) by mouth 2  (two) times daily. 180 tablet 1   . ANORO ELLIPTA 62.5-25 MCG/INH AEPB Inhale 1 puff into the lungs daily. 14 each 0   . calcium carbonate (OS-CAL) 600 MG TABS tablet Take 1 tablet (600 mg total) by mouth 2 (two) times daily with a meal. (Patient taking differently: Take 600 mg 3 (three) times daily by mouth. ) 60 tablet 0 12/26/2016 at Unknown time  . cholecalciferol (VITAMIN D) 1000 units tablet Take 4,000 Units by mouth daily.     . citalopram (CELEXA) 20 MG tablet Take 20 mg by mouth daily.  0   . conjugated estrogens (PREMARIN) vaginal cream Place vaginally 3 (three) times a week. Use 1/2 gram 3-4 times weekly. 42.5 g 4   . cycloSPORINE (RESTASIS) 0.05 % ophthalmic emulsion Place 1 drop into both eyes 2 (two) times daily. 60 each 5   . diclofenac sodium (VOLTAREN) 1 % GEL Apply 2 g topically 4 (four) times daily.     . ferrous sulfate (FERROUSUL) 325 (65 FE) MG tablet Take 1 tablet (325 mg total) by mouth daily with breakfast. 90 tablet 1   . gabapentin (NEURONTIN) 100 MG capsule Take 1-2 capsules (100-200 mg total) by mouth 3 (three) times daily. (Patient taking differently: Take 100 mg by mouth 2 (two) times daily. ) 540 capsule 1   . guaiFENesin (MUCINEX) 600 MG 12 hr tablet Take 1 tablet (600 mg total) by mouth 2 (two) times daily as needed for cough. (Patient taking differently: Take 600 mg by mouth daily. ) 30 tablet 0   . hydrocortisone (PROCTOZONE-HC) 2.5 % rectal cream Place 1 application rectally 2 (two) times daily. 30 g 1   . hydrOXYzine (ATARAX/VISTARIL) 10 MG tablet Take 1 tablet by mouth 2 (two) times daily as needed for anxiety.      Marland Kitchen KRILL OIL OMEGA-3 PO Take 350 mg by mouth daily.     Marland Kitchen levalbuterol (XOPENEX) 1.25 MG/3ML nebulizer solution Inhale 3 mLs into the lungs daily.     Marland Kitchen levothyroxine (SYNTHROID, LEVOTHROID) 100 MCG tablet Take 1 tablet (100 mcg total) by mouth daily before breakfast. 90 tablet 1   . linaclotide (LINZESS) 290 MCG CAPS capsule Take 1 capsule (290 mcg  total) by mouth daily before breakfast. 90 capsule 1   . magnesium oxide (MAG-OX) 400 MG tablet Take 400 mg by mouth daily with breakfast.   12/26/2016 at Unknown time  . montelukast (SINGULAIR) 10 MG tablet Take 1 tablet by mouth at bedtime.  11   . omeprazole (PRILOSEC) 20 MG capsule Take 1 capsule by mouth daily.     Vladimir Faster Glycol-Propyl Glycol (SYSTANE OP) Place 1 drop into both eyes daily as needed (dry eyes).   12/26/2016 at Unknown time  . polyethylene glycol powder (GLYCOLAX/MIRALAX) powder Take 17-34 g by mouth daily. 1700 g 5 prn at prn  . Probiotic Product (PROBIOTIC PO) Take 1 tablet by mouth daily.     . promethazine (PHENERGAN) 25 MG tablet Take 0.5 tablets (12.5 mg total) by mouth every 6 (six) hours as needed for nausea or vomiting. 30 tablet 0   . Pyridoxine HCl (VITAMIN B-6) 500 MG tablet Take 1 tablet by mouth 2 (two) times daily.   12/26/2016 at Unknown time  . ranitidine (ZANTAC) 150 MG tablet Take 150 mg by mouth daily before supper.   12/26/2016 at Unknown time  . tiZANidine (ZANAFLEX) 2 MG tablet Take 0.5-1 tablets (1-2 mg total) by mouth 2 (two) times daily as needed for muscle spasms. 180 tablet 1 prn at prn  . tolterodine (DETROL LA) 2 MG 24 hr capsule Take 1 capsule (2 mg total) daily by mouth. (Patient not taking: Reported on 12/26/2016) 30 capsule 11 Not Taking at Unknown time  . traZODone (DESYREL) 50 MG tablet Take 50 mg by mouth at bedtime.       Patient Stressors: Financial difficulties Health problems Traumatic event Other: "Pain" "fear" "trauma" "homeless"  Patient Strengths: Ability for insight Communication skills General fund of knowledge Motivation for treatment/growth  Treatment Modalities: Medication Management, Group therapy, Case management,  1 to 1 session with clinician, Psychoeducation, Recreational therapy.   Physician Treatment Plan for Primary Diagnosis: Bipolar I disorder, most recent episode mixed, severe with psychotic features  ([place]) Long Term Goal(s): Improvement in symptoms so as ready for discharge Improvement in symptoms so as ready for discharge   Short Term Goals: Ability to identify changes in lifestyle to reduce recurrence of condition will improve Ability to verbalize feelings will improve Ability to disclose and discuss suicidal ideas Ability to demonstrate self-control will improve Ability to identify and develop effective coping behaviors will improve Ability to maintain clinical measurements within normal limits will improve Compliance with prescribed medications will improve Ability to identify triggers associated with substance abuse/mental health issues will improve NA  Medication Management: Evaluate patient's response, side effects, and tolerance of medication regimen.  Therapeutic Interventions: 1 to 1 sessions, Unit Group sessions and Medication administration.  Evaluation of Outcomes: Progressing  Physician Treatment Plan for Secondary Diagnosis: Principal Problem:   Bipolar I disorder, most recent episode mixed, severe with psychotic features ([place]) Active Problems:   Hypothyroidism   Essential hypertension   Plantar fasciitis, bilateral   PTSD (post-traumatic stress disorder)   Agitation  Long Term Goal(s): Improvement in symptoms so as ready for discharge Improvement in symptoms so as ready for discharge   Short Term Goals: Ability to identify changes in lifestyle to reduce recurrence of condition will improve Ability to verbalize feelings will improve Ability to disclose and discuss suicidal ideas Ability to demonstrate self-control will improve Ability to identify and develop effective coping behaviors will improve Ability to maintain clinical measurements within normal limits will improve Compliance with prescribed medications will improve Ability to identify triggers associated with substance abuse/mental health issues will improve NA     Medication Management: Evaluate  patient's response, side effects, and tolerance of medication regimen.  Therapeutic Interventions: 1 to 1 sessions, Unit Group sessions and Medication administration.  Evaluation of Outcomes: Progressing   RN Treatment Plan for Primary Diagnosis: Bipolar I disorder, most recent episode mixed, severe with psychotic features ([place]) Long Term Goal(s): Knowledge of disease and therapeutic regimen to maintain health will improve  Short Term Goals: Ability to identify and develop effective coping behaviors will improve and Compliance with prescribed medications will improve  Medication Management: RN will administer medications as ordered by provider, will assess and evaluate patient's response and provide education to patient for prescribed medication. RN will report any adverse and/or side effects to prescribing provider.  Therapeutic Interventions: 1 on 1 counseling sessions, Psychoeducation, Medication administration, Evaluate responses to treatment, Monitor vital signs and CBGs as ordered, Perform/monitor CIWA, COWS, AIMS and Fall Risk screenings as ordered, Perform wound care treatments as ordered.  Evaluation of Outcomes: Progressing   LCSW Treatment Plan for Primary Diagnosis: Bipolar I disorder, most recent episode mixed, severe with psychotic features ([place]) Long Term Goal(s): Safe transition to appropriate next level of care at discharge, Engage patient in therapeutic group addressing interpersonal concerns.  Short Term Goals: Engage patient in aftercare planning with referrals and resources, Increase social support and Increase skills for wellness and recovery  Therapeutic Interventions: Assess for all discharge needs, 1 to 1 time with Social worker, Explore available resources and support systems, Assess for adequacy in community support network, Educate family and significant other(s) on suicide prevention, Complete Psychosocial Assessment, Interpersonal group therapy.  Evaluation of  Outcomes: Progressing   Progress in Treatment: Attending groups: No. Participating in groups: No. Taking medication as prescribed: Yes. Toleration medication: Yes. Family/Significant other contact made: No, will contact:  pt declined consent Patient understands diagnosis: Yes. Discussing patient identified problems/goals with staff: Yes. Medical problems stabilized or resolved: Yes. Denies suicidal/homicidal ideation: Yes. Issues/concerns per patient self-inventory: No. Other: none  New problem(s) identified: No, Describe:  none  New Short Term/Long Term Goal(s):  Patient Goals:  Pt wants ECT to treat depression  Discharge Plan or Barriers: TBD   Reason for Continuation of Hospitalization: Depression, irritability, disorganization, refusing medications at times.  Will begin ECT treatment on Friday   Estimated Length of Stay: 5-7 days.  Attendees: Patient: 09/26/2017   Physician: Dr Bary Leriche, MD 09/26/2017   Nursing: Polly Cobia RN 09/26/2017   RN Care Manager: 09/26/2017   Social Worker:Dickie Cloe, LCSW 09/26/2017   Recreational Therapist: Roanna Epley, LRT 09/26/2017   Other: Darin Engels LCSWA 09/26/2017   Other: 09/26/2017   Other: 09/26/2017     Scribe for Treatment Team: August Saucer, LCSW 09/26/2017 4:43 PM

## 2017-09-26 NOTE — Progress Notes (Signed)
Recreation Therapy Notes   Date: 09/26/2017  Time: 9:30 pm   Location: Craft Room   Behavioral response: N/A   Intervention Topic: Stress  Discussion/Intervention: Patient did not attend group.   Clinical Observations/Feedback:  Patient did not attend group.   Angela [name] LRT/CTRS         Angela [name] 09/26/2017 11:44 AM

## 2017-09-27 MED ORDER — PRAZOSIN HCL 2 MG PO CAPS: 2.0000 mg | ORAL_CAPSULE | Freq: Every day | ORAL | Status: DC

## 2017-09-27 MED ORDER — PRAZOSIN HCL 2 MG PO CAPS
2.0000 mg | ORAL_CAPSULE | Freq: Two times a day (BID) | ORAL | Status: DC
  Administered 2017-09-27 – 2017-10-11 (×25): 2 mg via ORAL
  Filled 2017-09-27 (×27): qty 1

## 2017-09-27 NOTE — Progress Notes (Addendum)
D: Patient stated slept poor last night .Stated appetite poor and energy level low. Stated concentration poor . Stated on Depression scale 9, hopeless 8 and anxiety - .( low 0-10 high) Denies suicidal  homicidal ideations  .  No auditory hallucinations  No pain concerns . Appropriate ADL'S. Interacting with peers and staff. Patient requested  Staff to leave her room , upset about open  fruit  Being tossed from room . Encourage  Patient to be  More  independent  And social . Patient refused  Medication this am Minipress Abilify Norvasc, lidocaine patch MD informed " Im exhausted I'm gonna take a nap you can put the patch on after" Continues to use walker as vehicle of transport. Continue to wear braces on wrist , knees and ankle.  Requested to talk with Social Worker about placement concerns .  voice to writer she is too sick to have ECT treatment.    A: Encourage patient participation with unit programming . Instruction  Given on  Medication , verbalize understanding.  R: Voice no other concerns. Staff continue to monitor

## 2017-09-27 NOTE — Progress Notes (Signed)
   09/27/17 1435  Clinical Encounter Type  Visited With Patient  Visit Type Follow-up;Spiritual support;Behavioral Health  Referral From Social work  Consult/Referral To Chaplain  Spiritual Encounters  Spiritual Needs Other (Comment)   Vance checked in on Ms. Corpus from a request this morning for a [facility] visit. She was asleep this morning and was still asleep when I check in on her. I will alert the on-call [facility] of the request.

## 2017-09-27 NOTE — [facility] Group Notes (Signed)
LCSW Group Therapy Note  09/27/2017 1:00 pm  Type of Therapy/Topic:  Group Therapy:  Balance in Life  Participation Level:  Active  Description of Group:    This group will address the concept of balance and how it feels and looks when one is unbalanced. Patients will be encouraged to process areas in their lives that are out of balance and identify reasons for remaining unbalanced. Facilitators will guide patients in utilizing problem-solving interventions to address and correct the stressor making their life unbalanced. Understanding and applying boundaries will be explored and addressed for obtaining and maintaining a balanced life. Patients will be encouraged to explore ways to assertively make their unbalanced needs known to significant others in their lives, using other group members and facilitator for support and feedback.  Therapeutic Goals: 1. Patient will identify two or more emotions or situations they have that consume much of in their lives. 2. Patient will identify signs/triggers that life has become out of balance:  3. Patient will identify two ways to set boundaries in order to achieve balance in their lives:  4. Patient will demonstrate ability to communicate their needs through discussion and/or role plays  Summary of Patient Progress:  Natallia actively participated in today's group discussion on balance in life.  Francys shared that the multiple deaths that she has had to experience over the past 3 years has been what has consumed much of her life.  Reina shared that the increase in grief and depression has triggered her life to become out of balance.  Yanelly shared that she has tried a lot of things to help achieve more balance in her life, but nothing has worked for her thus far.  Trynity shared that she needs to be in a place in which she will be able to cook, look at [place] shows, and take care of herself without being criticized to help her get her life back in  balance.    Therapeutic Modalities:   Cognitive Behavioral Therapy Solution-Focused Therapy Assertiveness Training  Devona Konig, [place] 09/27/2017 2:16 PM

## 2017-09-27 NOTE — Progress Notes (Signed)
   09/27/17 1600  Clinical Encounter Type  Visited With Patient  Visit Type Follow-up;Spiritual support  Referral From Nurse  Recommendations Follow-up as requested.  Spiritual Encounters  Spiritual Needs Emotional;Prayer;Other (Comment) (Desire to be healed.)   Chaplain was paged to visit with the patient. The patient complained of the flu and feeling poorly. Chaplain was sensitive to her physical discomfort. Chaplain listened actively as the patient shared of a narrative of painful rejections, chronic sorrow, ridicule, and powerlessness. The encounter was awkward/out-of-sync. The patient seemed hopeful of eliciting responses from me that weren't forthcoming (i.e., pity, taking her side against Dr. Mamie Nick, and perhaps advocating for her). Following, Chaplain was curious about the discrepancies in her narrative. When I inquired, she became defensive and sickly. She stated that she wanted to be held, loved, and accepted. Chaplain held space, prayed energetically, but also challenged the patient with Jesus's words "Do you want to be well?" I suspect she will call the next chaplain tomorrow and repeat the cycle.

## 2017-09-27 NOTE — Plan of Care (Addendum)
Patient found awake in bed upon my arrival. Patient is visible but not social this evening. Patient is cooperative and polite this evening. Mood is depressed and patient speaks extensively about her anxiety related to ECT Tx tomorrow. Answered her questions and she appears reassured and agrees to be NPO. Affect is sad and tearful at times speaking about the despair she feels she has been living with. Patient speaks at length regarding her many losses and her inability to feel better. Patient is extensively somatic and complains of generalized pain in her joints, back, and abdomen. Patient eats a large snack but complains of upset stomach and is given Maalox with positive results. Given scheduled Volteran cream for joints and new Lidocaine patch placed on back. Refused Zanaflex stating that it would make her too drowsy with the Trazodone. All with moderately good effect. Eating and voiding adequately. Educated regarding NPO status. Verbalized understanding and excess food and beverages removed from room. Compliant with HS medication and staff direction. Q 15 minute checks maintained. Will continue to monitor throughout the shift. Patient slept 7.5 hours. No apparent distress. VSS. CBG 80. Patient requests eye gtts due to pain. Administered 1 gtt in each eye, patient reports relief. All 0700 PO medications held due to NPO status. Remains NPO. Will endorse care to oncoming shift.  Problem: Education: Goal: Ability to make informed decisions regarding treatment will improve Outcome: Progressing   Problem: Coping: Goal: Coping ability will improve Outcome: Progressing   Problem: Medication: Goal: Compliance with prescribed medication regimen will improve Outcome: Progressing   Problem: Safety: Goal: Ability to remain free from injury will improve Outcome: Progressing

## 2017-09-27 NOTE — Plan of Care (Signed)
Patient has a negative out look on  life . Patient working on Biomedical scientist  but  Continue to get   get upset when confronted  Easily frustrated yells at staff  requesting to leave her room Encourage  to take   medication at appropriate times   Denies suicidal  ideations  . Working Patent examiner .    Problem: Education: Goal: Ability to make informed decisions regarding treatment will improve Outcome: Progressing   Problem: Coping: Goal: Coping ability will improve Outcome: Progressing   Problem: Health Behavior/Discharge Planning: Goal: Identification of resources available to assist in meeting health care needs will improve Outcome: Progressing   Problem: Medication: Goal: Compliance with prescribed medication regimen will improve Outcome: Progressing   Problem: Self-Concept: Goal: Ability to disclose and discuss suicidal ideas will improve Outcome: Progressing Goal: Will verbalize positive feelings about self Outcome: Progressing   Problem: Safety: Goal: Ability to remain free from injury will improve Outcome: Progressing

## 2017-09-27 NOTE — Consult Note (Signed)
ECT: Met with the patient today.  She continues to endorse depression and hopelessness and is still expressing confidence in ECT treatment.  She is been complaining of GI symptoms and having diarrhea today.  Went through her labs.  Everything looks adequate and normal.  Blood pressure is actually still staying kind of high but since she will be n.p.o. and is complaining of diarrhea I will not add anything yet.  Patient understands treatment will not be starting until noon tomorrow.  Orders put in for n.p.o.  She is on the schedule.

## 2017-09-27 NOTE — Progress Notes (Signed)
Recreation Therapy Notes  Date: 09/27/2017  Time: 9:30 pm   Location: Craft Room   Behavioral response: N/A   Intervention Topic: Creative expressions  Discussion/Intervention: Patient did not attend group.   Clinical Observations/Feedback:  Patient did not attend group.   Marcella Charlson LRT/CTRS        Angela [name] 09/27/2017 10:36 AM

## 2017-09-27 NOTE — Progress Notes (Signed)
Patient ID: Angela [name], female   DOB: [birth year], 67 y.o.   MRN: [id number] CSW contacted [facility] and spoke with Angela [name].  Pt had asked CSW to contact the ALF to inquire about getting her possessions sent to her.  Pt informed CSW that the ALF has the right to discard her things in 15 days and it was getting close to when that would be occurring.  Pt informed Angela [name] that pt would like her possessions to be brought to her at the hospital.  Angela [name] shared that he would be willing to bring the things to the hospital and that they had been packed up.  Pt asked CSW to contact her friend, Angela [name], to see if she would be willing to meet [name] at the hospital and take her possessions to her home.  CSW attempted to contact Angela [name] twice was unable to.  CSW explained to pt that she would not be able to store her possessions here at the hospital so other arrangements must be made prior to [place] bringing them.  CSW agreed to try to get in touch with Angela [name] on pt's behalf.

## 2017-09-27 NOTE — Progress Notes (Addendum)
   09/27/17 1000  Clinical Encounter Type  Visited With Health care provider  Referral From  (Order Request)  Consult/Referral To Chaplain (Chaplain Venia Minks)   Chaplain responded to a page to speak with the patient. Upon arriving at [facility], [place] spoke with staff about the patient. Further, while reviewing chaplain's notes from previous encounters, Chaplain was paged to a Rapid Response. Chaplain left [facility] promptly responded to the page. Chaplain did not see the patient.  Addendum  At [phone], Chaplain spoke to [facility] about responding to the page to speak with the patient. Colette Ribas agreed and went to [facility] to meet the patient.

## 2017-09-27 NOTE — Progress Notes (Signed)
Patient is alert and oriented x 4, denies SI/HI/AVH, mood is irritable, she argumentative and very demanding and hostile to staff. Patient's thoughts are organized, no distress noted, 15 minutes checks maintained will continue to monitor.

## 2017-09-27 NOTE — Progress Notes (Signed)
   09/27/17 1001  Clinical Encounter Type  Visited With Patient not available;Health care provider  Visit Type Follow-up;Spiritual support;Behavioral Health  Referral From Chaplain  Consult/Referral To Chaplain  Spiritual Encounters  Spiritual Needs Other (Comment)   Angela [name] was alerted by Tonia Ghent that he had received a page from [facility] to come and talk with Angela [name]. Upon arriving to the [facility] I was advised that the patient was currently asleep and it would be helpful if I could come back after Angela [name]'s nap. I agreed and will follow up after lunch.

## 2017-09-28 ENCOUNTER — Inpatient Hospital Stay: Payer: Medicare Other | Admitting: Certified Registered Nurse Anesthetist

## 2017-09-28 ENCOUNTER — Other Ambulatory Visit: Payer: Self-pay | Admitting: Psychiatry

## 2017-09-28 LAB — GLUCOSE, CAPILLARY: Glucose-Capillary: 80 mg/dL (ref 70–99)

## 2017-09-28 MED ORDER — ONDANSETRON HCL 4 MG/2ML IJ SOLN
4.0000 mg | Freq: Once | INTRAMUSCULAR | Status: AC
  Administered 2017-09-28: 4 mg via INTRAVENOUS
  Filled 2017-09-28: qty 2

## 2017-09-28 MED ORDER — KETOROLAC TROMETHAMINE 30 MG/ML IJ SOLN
30.0000 mg | Freq: Once | INTRAMUSCULAR | Status: AC
  Administered 2017-10-01: 30 mg via INTRAVENOUS

## 2017-09-28 MED ORDER — SODIUM CHLORIDE 0.9 % IV SOLN
INTRAVENOUS | Status: DC | PRN
  Administered 2017-09-28: 11:00:00 via INTRAVENOUS

## 2017-09-28 MED ORDER — METHOHEXITAL SODIUM 100 MG/10ML IV SOSY
PREFILLED_SYRINGE | INTRAVENOUS | Status: DC | PRN
  Administered 2017-09-28: 60 mg via INTRAVENOUS

## 2017-09-28 MED ORDER — SODIUM CHLORIDE 0.9 % IV SOLN
500.0000 mL | Freq: Once | INTRAVENOUS | Status: AC
  Administered 2017-09-28: 500 mL via INTRAVENOUS

## 2017-09-28 MED ORDER — SUCCINYLCHOLINE CHLORIDE 20 MG/ML IJ SOLN
INTRAMUSCULAR | Status: DC | PRN
  Administered 2017-09-28: 80 mg via INTRAVENOUS

## 2017-09-28 NOTE — Progress Notes (Signed)
[facility] MD Progress Note  09/28/2017 7:21 PM Angela [name]  MRN:  [id number] Subjective: Follow-up for this patient with bipolar disorder and chronic behavior issues and multiple medical problems.  Patient continues to endorse being very sad.  Her affect and mood will change dramatically through the day.  I came to see her this evening and found her crying and saying that she felt terrible and was very focused on how no one loved her and everything in her life is gone wrong and that she wished she would die.  She is not however doing anything to try to hurt her self and has no plan to try to kill herself.  Patient seems to be generally organized in her thinking but very sad and depressed and as always very somatically focused.  She had right unilateral ECT treatment this morning which was done without any complication or difficulty. Principal Problem: Bipolar I disorder, most recent episode mixed, severe with psychotic features ([place]) Diagnosis:   Patient Active Problem List   Diagnosis Date Noted  . Bipolar I disorder, most recent episode mixed, severe with psychotic features ([place]) [F31.64] 09/18/2017  . Agitation [R45.1] 09/18/2017  . IBS (irritable bowel syndrome) [K58.9] 11/17/2016  . Bilateral carpal tunnel syndrome [G56.03] 11/17/2016  . Plantar fasciitis, bilateral [M72.2] 11/17/2016  . Chronic pain syndrome [G89.4] 11/17/2016  . Chronic hyponatremia [E87.1] 11/17/2016  . PTSD (post-traumatic stress disorder) [F43.10] 11/17/2016  . Hernia, hiatal [K44.9] 11/17/2016  . Vitamin D deficiency [E55.9] 11/17/2016  . Vitamin B12 deficiency [E53.8] 11/17/2016  . SIADH (syndrome of inappropriate ADH production) ([place]) [E22.2] 09/08/2016  . Cervical spondylosis with myelopathy [M47.12] 05/24/2016  . Post-concussion headache [G44.309] 05/24/2016  . MCI (mild cognitive impairment) with memory loss [G31.84] 03/30/2016  . Gait abnormality [R26.9] 03/30/2016  . Chronic bipolar affective disorder  ([place]) [F31.9] 01/24/2016  . Anemia, iron deficiency [D50.9] 06/24/2015  . Endometriosis [N80.9] 06/24/2015  . Essential hypertension [I10] 06/24/2015  . History of postmenopausal bleeding [Z87.42] 05/22/2015  . History of TIA (transient ischemic attack) [Z86.73] 04/20/2015  . Hypothyroidism [E03.9] 04/20/2015  . GERD (gastroesophageal reflux disease) [K21.9] 04/20/2015  . Cervical disc disorder with radiculopathy [M50.10] 03/25/2015  . Chronic neck pain [M54.2, G89.29] 03/25/2015  . Pelvic pain in female [R10.2] 10/30/2014  . History of skin cancer [Z85.828] 06/30/2014  . Chronic headaches [R51] 12/16/2013  . Neuropathy [G62.9] 12/16/2013   Total Time spent with patient: 30 minutes  Past Psychiatric History: Long history of bipolar disorder with mood instability multiple hospitalizations  Past Medical History:  Past Medical History:  Diagnosis Date  . Allergic rhinitis   . Anemia   . Blurred vision   . Depression   . Diverticulosis   . Endometriosis   . Falls   . GERD (gastroesophageal reflux disease)   . Hernia, hiatal   . Hypothyroidism   . IBS (irritable bowel syndrome)   . Malignant neoplasm of skin   . Migraine   . Neuropathy   . PTSD (post-traumatic stress disorder)   . Thyroid disease     Past Surgical History:  Procedure Laterality Date  . APPENDECTOMY    . CHOLECYSTECTOMY    . CHOLECYSTECTOMY, LAPAROSCOPIC    . ESOPHAGOGASTRODUODENOSCOPY (EGD) WITH PROPOFOL N/A 03/29/2015   Procedure: ESOPHAGOGASTRODUODENOSCOPY (EGD) WITH PROPOFOL;  Surgeon: Josefine Class, MD;  Location: [facility] ENDOSCOPY;  Service: Endoscopy;  Laterality: N/A;  . HERNIA REPAIR    . laproscopy    . TONSILLECTOMY    . uteral suspension  Family History:  Family History  Problem Relation Age of Onset  . Heart disease Father   . Cancer Mother        lung  . Urolithiasis Neg Hx   . Kidney disease Neg Hx   . Kidney cancer Neg Hx   . Prostate cancer Neg Hx    Family Psychiatric   History: See previous note.  Positive for bipolar disorder Social History:  Social History   Substance and Sexual Activity  Alcohol Use No     Social History   Substance and Sexual Activity  Drug Use No    Social History   Socioeconomic History  . Marital status: Single    Spouse name: Not on file  . Number of children: 0  . Years of education: 1.5 years of college  . Highest education level: Not on file  Occupational History  . Occupation: Retired  Scientific laboratory technician  . Financial resource strain: Not on file  . Food insecurity:    Worry: Not on file    Inability: Not on file  . Transportation needs:    Medical: Not on file    Non-medical: Not on file  Tobacco Use  . Smoking status: Former Smoker    Last attempt to quit: 11/17/1976    Years since quitting: 40.8  . Smokeless tobacco: Never Used  Substance and Sexual Activity  . Alcohol use: No  . Drug use: No  . Sexual activity: Not Currently  Lifestyle  . Physical activity:    Days per week: Not on file    Minutes per session: Not on file  . Stress: Not on file  Relationships  . Social connections:    Talks on phone: Not on file    Gets together: Not on file    Attends religious service: Not on file    Active member of club or organization: Not on file    Attends meetings of clubs or organizations: Not on file    Relationship status: Not on file  Other Topics Concern  . Not on file  Social History Narrative   Lives at home alone.   Right-handed.   No daily caffeine use.   Additional Social History:    Pain Medications: See PTA Prescriptions: See PTA Over the Counter: See PTA History of alcohol / drug use?: No history of alcohol / drug abuse Longest period of sobriety (when/how long): Reports of none Negative Consequences of Use: (n/a) Withdrawal Symptoms: (n/a)                    Sleep: Fair  Appetite:  Fair  Current Medications: Current Facility-Administered Medications  Medication Dose  Route Frequency Provider Last Rate Last Dose  . acetaminophen (TYLENOL) tablet 650 mg  650 mg Oral Q6H PRN Amesha Bailey, Madie Reno, MD   650 mg at 09/28/17 1416  . alum & mag hydroxide-simeth (MAALOX/MYLANTA) 200-200-20 MG/5ML suspension 30 mL  30 mL Oral Q4H PRN Ellon Marasco T, MD   30 mL at 09/28/17 1416  . amLODipine (NORVASC) tablet 5 mg  5 mg Oral BID Jalayla Chrismer, Madie Reno, MD   5 mg at 09/27/17 1609  . calcium carbonate (TUMS - dosed in mg elemental calcium) chewable tablet 500 mg of elemental calcium  500 mg of elemental calcium Oral BID WC Pucilowska, Jolanta B, MD   500 mg of elemental calcium at 09/28/17 1730  . cholecalciferol (VITAMIN D) tablet 1,000 Units  1,000 Units Oral Daily Pucilowska, Wardell Honour, MD  1,000 Units at 09/28/17 1729  . cycloSPORINE (RESTASIS) 0.05 % ophthalmic emulsion 1 drop  1 drop Both Eyes BID Mayara Paulson, Madie Reno, MD   1 drop at 09/28/17 [phone]  . diclofenac sodium (VOLTAREN) 1 % transdermal gel 4 g  4 g Topical QID Pucilowska, Jolanta B, MD   4 g at 09/27/17 2124  . ferrous sulfate tablet 325 mg  325 mg Oral Q breakfast Pucilowska, Jolanta B, MD   325 mg at 09/28/17 1729  . gabapentin (NEURONTIN) capsule 100 mg  100 mg Oral BID Jevon Littlepage T, MD   100 mg at 09/28/17 1729  . hydrOXYzine (ATARAX/VISTARIL) tablet 50 mg  50 mg Oral TID PRN Deniya Craigo, Madie Reno, MD   50 mg at 09/28/17 1418  . ketorolac (TORADOL) 30 MG/ML injection 30 mg  30 mg Intravenous Once Demarko Zeimet T, MD      . levothyroxine (SYNTHROID, LEVOTHROID) tablet 100 mcg  100 mcg Oral QAC breakfast Pucilowska, Jolanta B, MD   100 mcg at 09/28/17 1423  . lidocaine (LIDODERM) 5 % 1 patch  1 patch Transdermal Q24H Pucilowska, Jolanta B, MD   1 patch at 09/27/17 2131  . linaclotide (LINZESS) capsule 290 mcg  290 mcg Oral QAC breakfast Orson Slick B, MD   Stopped at 09/28/17 [phone]  . LORazepam (ATIVAN) tablet 0.5 mg  0.5 mg Oral Q6H PRN Hermen Mario, Madie Reno, MD   0.5 mg at 09/26/17 2204  . magnesium hydroxide (MILK OF  MAGNESIA) suspension 30 mL  30 mL Oral Daily PRN Desarai Barrack, Madie Reno, MD   30 mL at 09/22/17 0920  . magnesium oxide (MAG-OX) tablet 200 mg  200 mg Oral BID Pucilowska, Jolanta B, MD   200 mg at 09/28/17 1732  . multivitamin with minerals tablet 1 tablet  1 tablet Oral Daily Pucilowska, Jolanta B, MD   1 tablet at 09/28/17 1729  . omega-3 acid ethyl esters (LOVAZA) capsule 1 g  1 g Oral BID Pucilowska, Jolanta B, MD   1 g at 09/28/17 1734  . ondansetron (ZOFRAN) injection 4 mg  4 mg Intravenous Once Blain Hunsucker T, MD      . ondansetron (ZOFRAN-ODT) disintegrating tablet 4 mg  4 mg Oral Q8H PRN Pucilowska, Jolanta B, MD   4 mg at 09/27/17 1605  . pantoprazole (PROTONIX) EC tablet 40 mg  40 mg Oral Daily Pucilowska, Jolanta B, MD   40 mg at 09/28/17 1417  . PARoxetine (PAXIL-CR) 24 hr tablet 25 mg  25 mg Oral Daily Pucilowska, Jolanta B, MD   25 mg at 09/28/17 1733  . prazosin (MINIPRESS) capsule 2 mg  2 mg Oral BID Pucilowska, Jolanta B, MD   2 mg at 09/28/17 1728  . tiZANidine (ZANAFLEX) tablet 2 mg  2 mg Oral Q8H PRN Pucilowska, Jolanta B, MD      . traZODone (DESYREL) tablet 50 mg  50 mg Oral QHS Pucilowska, Jolanta B, MD   50 mg at 09/27/17 2125    Lab Results:  Results for orders placed or performed during the hospital encounter of 09/19/17 (from the past 48 hour(s))  Glucose, capillary     Status: None   Collection Time: 09/28/17  6:36 AM  Result Value Ref Range   Glucose-Capillary 80 70 - 99 mg/dL    Blood Alcohol level:  Lab Results  Component Value Date   [facility] <5 09/29/2016   ETH <5 62/83/1517    Metabolic Disorder Labs: Lab Results  Component Value Date  HGBA1C 5.7 (H) 09/20/2017   MPG 116.89 09/20/2017   No results found for: PROLACTIN Lab Results  Component Value Date   CHOL 173 09/20/2017   TRIG 193 (H) 09/20/2017   HDL 50 09/20/2017   CHOLHDL 3.5 09/20/2017   VLDL 39 09/20/2017   LDLCALC 84 09/20/2017   LDLCALC 87 09/04/2017    Physical Findings: AIMS:  , ,   ,  ,    CIWA:    COWS:     Musculoskeletal: Strength & Muscle Tone: within normal limits Gait & Station: shuffle Patient leans: N/A  Psychiatric Specialty Exam: Physical Exam  Nursing note and vitals reviewed. Constitutional: She appears well-developed.  HENT:  Head: Normocephalic and atraumatic.  Eyes: Pupils are equal, round, and reactive to light. Conjunctivae are normal.  Neck: Normal range of motion.  Cardiovascular: Regular rhythm and normal heart sounds.  Respiratory: Effort normal.  GI: Soft.  Musculoskeletal: Normal range of motion.  Neurological: She is alert.  Skin: Skin is warm and dry.  Psychiatric: Her mood appears anxious. Her affect is labile. Her speech is tangential. She is agitated. She is not aggressive. Thought content is not paranoid. Cognition and memory are impaired. She expresses impulsivity. She exhibits a depressed mood. She expresses suicidal ideation. She expresses no homicidal ideation. She expresses no suicidal plans.    Review of Systems  Constitutional: Negative.   HENT: Negative.   Eyes: Negative.   Respiratory: Negative.   Cardiovascular: Negative.   Gastrointestinal: Negative.   Musculoskeletal: Positive for back pain and joint pain.  Skin: Negative.   Neurological: Negative.   Psychiatric/Behavioral: Positive for depression, memory loss and suicidal ideas. Negative for hallucinations and substance abuse. The patient is nervous/anxious and has insomnia.     Blood pressure (!) 142/77, pulse 67, temperature 98.6 F (37 C), temperature source Oral, resp. rate 20, height 5\' 3"  (1.6 m), weight 60.8 kg, SpO2 97 %.Body mass index is 23.74 kg/m.  General Appearance: Disheveled  Eye Contact:  Fair  Speech:  Garbled  Volume:  Increased  Mood:  Anxious, Depressed and Dysphoric  Affect:  Congruent  Thought Process:  Disorganized  Orientation:  Full (Time, Place, and Person)  Thought Content:  Rumination and Tangential  Suicidal Thoughts:  Yes.   without intent/plan  Homicidal Thoughts:  No  Memory:  Immediate;   Fair Recent;   Poor Remote;   Fair  Judgement:  Impaired  Insight:  Shallow  Psychomotor Activity:  Restlessness  Concentration:  Concentration: Poor  Recall:  [name]:  Fair  Language:  Fair  Akathisia:  No  Handed:  Right  AIMS (if indicated):     Assets:  Communication Skills Desire for Improvement  ADL's:  Impaired  Cognition:  Impaired,  Mild  Sleep:  Number of Hours: 7.5     Treatment Plan Summary: Daily contact with patient to assess and evaluate symptoms and progress in treatment, Medication management and Plan Complicated patient.  Spent some time trying to offer some empathy and support to her.  Listen to her multiple somatic complaints.  They are all very difficult to address.  She is complaining that she is not getting all of the eyedrops that she should but I cannot see that any of them are even on the formulary.  She complains of an eye infection but there is nothing apparently with that I can see wrong with her eye.  In any case although she talks about wishing she were dead I  do not think she is actively planning on trying to kill herself.  Ultimately offered supportive counseling and we will continue current medicine with the main plan bit to be continuing ECT with next treatment on Monday.  Alethia Berthold, MD 09/28/2017, 7:21 PM

## 2017-09-28 NOTE — Transfer of Care (Signed)
Immediate Anesthesia Transfer of Care Note  Patient: Angela [name]  Procedure(s) Performed: ECT TX  Patient Location: PACU  Anesthesia Type:General  Level of Consciousness: sedated  Airway & Oxygen Therapy: Patient Spontanous Breathing and Patient connected to face mask oxygen  Post-op Assessment: Report given to RN and Post -op Vital signs reviewed and stable  Post vital signs: Reviewed and stable  Last Vitals:  Vitals Value Taken Time  BP    Temp    Pulse 72 09/28/2017 11:30 AM  Resp 12 09/28/2017 11:30 AM  SpO2 97 % 09/28/2017 11:30 AM  Vitals shown include unvalidated device data.  Last Pain:  Vitals:   09/28/17 1024  TempSrc: Oral  PainSc: 8       Patients Stated Pain Goal: 5 (24/82/50 0370)  Complications: No apparent anesthesia complications

## 2017-09-28 NOTE — Anesthesia Preprocedure Evaluation (Signed)
Anesthesia Evaluation  Patient identified by MRN, date of birth, ID band Patient awake    Reviewed: Allergy & Precautions, H&P , NPO status , Patient's Chart, lab work & pertinent test results  History of Anesthesia Complications Negative for: history of anesthetic complications  Airway Mallampati: III  TM Distance: <3 FB Neck ROM: limited    Dental  (+) Chipped   Pulmonary neg pulmonary ROS, neg shortness of breath, former smoker,           Cardiovascular Exercise Tolerance: Good hypertension, (-) angina(-) Past MI and (-) DOE      Neuro/Psych  Headaches, PSYCHIATRIC DISORDERS Anxiety Depression Bipolar Disorder  Neuromuscular disease CVA, Residual Symptoms negative neurological ROS     GI/Hepatic Neg liver ROS, hiatal hernia, GERD  Medicated and Controlled,  Endo/Other  Hypothyroidism   Renal/GU negative Renal ROS  negative genitourinary   Musculoskeletal   Abdominal   Peds  Hematology negative hematology ROS (+)   Anesthesia Other Findings Past Medical History: No date: Allergic rhinitis No date: Anemia No date: Blurred vision No date: Depression No date: Diverticulosis No date: Endometriosis No date: Falls No date: GERD (gastroesophageal reflux disease) No date: Hernia, hiatal No date: Hypothyroidism No date: IBS (irritable bowel syndrome) No date: Malignant neoplasm of skin No date: Migraine No date: Neuropathy No date: PTSD (post-traumatic stress disorder) No date: Thyroid disease  Past Surgical History: No date: APPENDECTOMY No date: CHOLECYSTECTOMY No date: CHOLECYSTECTOMY, LAPAROSCOPIC 03/29/2015: ESOPHAGOGASTRODUODENOSCOPY (EGD) WITH PROPOFOL; N/A     Comment:  Procedure: ESOPHAGOGASTRODUODENOSCOPY (EGD) WITH               PROPOFOL;  Surgeon: Josefine Class, MD;  Location:               [facility] ENDOSCOPY;  Service: Endoscopy;  Laterality: N/A; No date: HERNIA REPAIR No date:  laproscopy No date: TONSILLECTOMY No date: uteral suspension  BMI    Body Mass Index:  23.74 kg/m      Reproductive/Obstetrics negative OB ROS                             Anesthesia Physical Anesthesia Plan  ASA: III  Anesthesia Plan: General   Post-op Pain Management:    Induction: Intravenous  PONV Risk Score and Plan: TIVA  Airway Management Planned: Simple Face Mask and Natural Airway  Additional Equipment:   Intra-op Plan:   Post-operative Plan:   Informed Consent: I have reviewed the patients History and Physical, chart, labs and discussed the procedure including the risks, benefits and alternatives for the proposed anesthesia with the patient or authorized representative who has indicated his/her understanding and acceptance.   Dental Advisory Given  Plan Discussed with: Anesthesiologist, CRNA and Surgeon  Anesthesia Plan Comments: (Patient consented for risks of anesthesia including but not limited to:  - adverse reactions to medications - risk of intubation if required - damage to teeth, lips or other oral mucosa - sore throat or hoarseness - Damage to heart, brain, lungs or loss of life  Patient voiced understanding.)        Anesthesia Quick Evaluation

## 2017-09-28 NOTE — [facility] Group Notes (Signed)
[facility] Group Notes:  (Nursing/MHT/Case Management/Adjunct)  Date:  09/28/2017  Time:  11:54 PM  Type of Therapy:  Group Therapy  Participation Level:  Active  Participation Quality:  Appropriate  Affect:  Appropriate  Cognitive:  Appropriate  Insight:  Appropriate  Engagement in Group:  Engaged  Modes of Intervention:  Discussion  Summary of Progress/Problems: Johnay attended group, even though she reported not feeling well. Cesar stated her goal was to make it through ECT. Fatemah stated she felt worse afterwards. Ladona stated she had a lot of grief to get over Lavere stated she was working on getting her health back. MHT reviewed the rules and expectations of the unit. 1. Visitation hours and number of visitors 2. Phone hours 3. No food in rooms (explained why food was not permitted in rooms) 4. No sharing clothes or borrowing clothes 5. Explained how snack would be provided 6. No sharing personal information or last names 7. No touching, hugging, doing hair, or going into other patients rooms MHT processed with patients about the importance of attending groups MHT explained groups were designed to help patients learn new coping skills and gain insight into why they were admitted MHT explained learning new coping skills and new things about self would help prepare them for discharge MHT processed with group about after care and the importance of taking medications MHT encouraged patients to talk with providers about their care. MHT addressed concerns and answered questions related to care MHT informed patients to be active in creating their person-centered plan MHT informed group of services available upon discharge, even those who do not have insurance  [name] 09/28/2017, 11:54 PM

## 2017-09-28 NOTE — H&P (Signed)
Angela [name] is an 67 y.o. female.   Chief Complaint: Severe depression as well as multiple medical complaints lots of muscle and joint soreness HPI: History of recurrent depression as part of what is probably of bipolar disorder  Past Medical History:  Diagnosis Date  . Allergic rhinitis   . Anemia   . Blurred vision   . Depression   . Diverticulosis   . Endometriosis   . Falls   . GERD (gastroesophageal reflux disease)   . Hernia, hiatal   . Hypothyroidism   . IBS (irritable bowel syndrome)   . Malignant neoplasm of skin   . Migraine   . Neuropathy   . PTSD (post-traumatic stress disorder)   . Thyroid disease     Past Surgical History:  Procedure Laterality Date  . APPENDECTOMY    . CHOLECYSTECTOMY    . CHOLECYSTECTOMY, LAPAROSCOPIC    . ESOPHAGOGASTRODUODENOSCOPY (EGD) WITH PROPOFOL N/A 03/29/2015   Procedure: ESOPHAGOGASTRODUODENOSCOPY (EGD) WITH PROPOFOL;  Surgeon: Josefine Class, MD;  Location: [facility] ENDOSCOPY;  Service: Endoscopy;  Laterality: N/A;  . HERNIA REPAIR    . laproscopy    . TONSILLECTOMY    . uteral suspension      Family History  Problem Relation Age of Onset  . Heart disease Father   . Cancer Mother        lung  . Urolithiasis Neg Hx   . Kidney disease Neg Hx   . Kidney cancer Neg Hx   . Prostate cancer Neg Hx    Social History:  reports that she quit smoking about 40 years ago. She has never used smokeless tobacco. She reports that she does not drink alcohol or use drugs.  Allergies:  Allergies  Allergen Reactions  . Bee Venom Hives  . Darvon [Propoxyphene] Anaphylaxis and Hives    Hives/throat swelling   . Dicyclomine Itching  . Other Hives    Spider bites cause hives  . Oxycodone-Acetaminophen Nausea And Vomiting and Other (See Comments)    hallucinations   . Peanuts [Peanut Oil] Anaphylaxis and Hives  . Penicillins Hives    Has patient had a PCN reaction causing immediate rash, facial/tongue/throat swelling, SOB or  lightheadedness with hypotension: Unknown Has patient had a PCN reaction causing severe rash involving mucus membranes or skin necrosis: Unknown Has patient had a PCN reaction that required hospitalization: Unknown Has patient had a PCN reaction occurring within the last 10 years: Unknown If all of the above answers are "NO", then may proceed with Cephalosporin use.  . Sulfa Antibiotics Anaphylaxis, Hives and Itching  . Sulfacetamide Sodium Anaphylaxis, Hives and Itching  . Ultram [Tramadol] Hives  . Amikacin Nausea And Vomiting  . Aspirin Hives  . Doxycycline Photosensitivity  . Haloperidol Nausea And Vomiting  . Hymenoptera Venom Preparations Hives    Reaction to spider bites  . Imipramine Hives    Reaction to Tofranil  . Keflex [Cephalexin] Hives  . Sulfur Hives  . Adhesive [Tape] Hives and Rash  . Hydroxyzine Anxiety and Itching  . Prednisone Nausea And Vomiting, Anxiety and Other (See Comments)    Crazy mood swings, strange thoughts (pt does tolerate cortisone injections)    Medications Prior to Admission  Medication Sig Dispense Refill  . amLODipine (NORVASC) 5 MG tablet Take 1 tablet (5 mg total) by mouth 2 (two) times daily. 180 tablet 1  . ANORO ELLIPTA 62.5-25 MCG/INH AEPB Inhale 1 puff into the lungs daily. 14 each 0  . calcium carbonate (OS-CAL)  600 MG TABS tablet Take 1 tablet (600 mg total) by mouth 2 (two) times daily with a meal. (Patient taking differently: Take 600 mg 3 (three) times daily by mouth. ) 60 tablet 0  . cholecalciferol (VITAMIN D) 1000 units tablet Take 4,000 Units by mouth daily.    . citalopram (CELEXA) 20 MG tablet Take 20 mg by mouth daily.  0  . conjugated estrogens (PREMARIN) vaginal cream Place vaginally 3 (three) times a week. Use 1/2 gram 3-4 times weekly. 42.5 g 4  . cycloSPORINE (RESTASIS) 0.05 % ophthalmic emulsion Place 1 drop into both eyes 2 (two) times daily. 60 each 5  . diclofenac sodium (VOLTAREN) 1 % GEL Apply 2 g topically 4 (four)  times daily.    . ferrous sulfate (FERROUSUL) 325 (65 FE) MG tablet Take 1 tablet (325 mg total) by mouth daily with breakfast. 90 tablet 1  . gabapentin (NEURONTIN) 100 MG capsule Take 1-2 capsules (100-200 mg total) by mouth 3 (three) times daily. (Patient taking differently: Take 100 mg by mouth 2 (two) times daily. ) 540 capsule 1  . guaiFENesin (MUCINEX) 600 MG 12 hr tablet Take 1 tablet (600 mg total) by mouth 2 (two) times daily as needed for cough. (Patient taking differently: Take 600 mg by mouth daily. ) 30 tablet 0  . hydrocortisone (PROCTOZONE-HC) 2.5 % rectal cream Place 1 application rectally 2 (two) times daily. 30 g 1  . hydrOXYzine (ATARAX/VISTARIL) 10 MG tablet Take 1 tablet by mouth 2 (two) times daily as needed for anxiety.     Marland Kitchen KRILL OIL OMEGA-3 PO Take 350 mg by mouth daily.    Marland Kitchen levalbuterol (XOPENEX) 1.25 MG/3ML nebulizer solution Inhale 3 mLs into the lungs daily.    Marland Kitchen levothyroxine (SYNTHROID, LEVOTHROID) 100 MCG tablet Take 1 tablet (100 mcg total) by mouth daily before breakfast. 90 tablet 1  . linaclotide (LINZESS) 290 MCG CAPS capsule Take 1 capsule (290 mcg total) by mouth daily before breakfast. 90 capsule 1  . magnesium oxide (MAG-OX) 400 MG tablet Take 400 mg by mouth daily with breakfast.    . montelukast (SINGULAIR) 10 MG tablet Take 1 tablet by mouth at bedtime.  11  . omeprazole (PRILOSEC) 20 MG capsule Take 1 capsule by mouth daily.    Vladimir Faster Glycol-Propyl Glycol (SYSTANE OP) Place 1 drop into both eyes daily as needed (dry eyes).    . polyethylene glycol powder (GLYCOLAX/MIRALAX) powder Take 17-34 g by mouth daily. 1700 g 5  . Probiotic Product (PROBIOTIC PO) Take 1 tablet by mouth daily.    . promethazine (PHENERGAN) 25 MG tablet Take 0.5 tablets (12.5 mg total) by mouth every 6 (six) hours as needed for nausea or vomiting. 30 tablet 0  . Pyridoxine HCl (VITAMIN B-6) 500 MG tablet Take 1 tablet by mouth 2 (two) times daily.    . ranitidine (ZANTAC) 150  MG tablet Take 150 mg by mouth daily before supper.    Marland Kitchen tiZANidine (ZANAFLEX) 2 MG tablet Take 0.5-1 tablets (1-2 mg total) by mouth 2 (two) times daily as needed for muscle spasms. 180 tablet 1  . tolterodine (DETROL LA) 2 MG 24 hr capsule Take 1 capsule (2 mg total) daily by mouth. 30 capsule 11  . traZODone (DESYREL) 50 MG tablet Take 50 mg by mouth at bedtime.      Results for orders placed or performed during the hospital encounter of 09/19/17 (from the past 48 hour(s))  Glucose, capillary  Status: None   Collection Time: 09/28/17  6:36 AM  Result Value Ref Range   Glucose-Capillary 80 70 - 99 mg/dL   No results found.  Review of Systems  Constitutional: Negative.   HENT: Negative.   Eyes: Negative.   Respiratory: Negative.   Cardiovascular: Negative.   Gastrointestinal: Negative.   Musculoskeletal: Positive for back pain and joint pain.  Skin: Negative.   Neurological: Negative.   Psychiatric/Behavioral: Positive for depression. Negative for hallucinations, memory loss, substance abuse and suicidal ideas. The patient is nervous/anxious and has insomnia.     Blood pressure (!) 143/93, pulse (!) 52, temperature 97.7 F (36.5 C), temperature source Oral, resp. rate 16, height 5\' 3"  (1.6 m), weight 60.8 kg, SpO2 99 %. Physical Exam  Nursing note and vitals reviewed. Constitutional: She appears well-developed and well-nourished.  HENT:  Head: Normocephalic and atraumatic.  Eyes: Pupils are equal, round, and reactive to light. Conjunctivae are normal.  Neck: Normal range of motion.  Cardiovascular: Regular rhythm and normal heart sounds.  Respiratory: Effort normal. No respiratory distress.  GI: Soft.  Musculoskeletal: Normal range of motion.  Neurological: She is alert.  Skin: Skin is warm and dry.  Psychiatric: Her affect is blunt. Her speech is delayed. She is slowed. Cognition and memory are normal. She expresses impulsivity. She exhibits a depressed mood. She  expresses suicidal ideation. She expresses no suicidal plans.     Assessment/Plan Treatment today and reassessment on an ongoing basis while she is inpatient  Alethia Berthold, MD 09/28/2017, 11:07 AM

## 2017-09-28 NOTE — Procedures (Signed)
ECT SERVICES Physician's Interval Evaluation & Treatment Note  Patient Identification: Yuridiana Formanek MRN:  [id number] Date of Evaluation:  09/28/2017 TX #: 1  MADRS: 28  MMSE: 28  P.E. Findings:  Lots of muscle soreness joint soreness but heart and lungs normal  Psychiatric Interval Note:  Major depression and anxiety  Subjective:  Patient is a 67 y.o. female seen for evaluation for Electroconvulsive Therapy. Depressed  Treatment Summary:   [x]   Right Unilateral             []  Bilateral   % Energy : 0.3 ms 70%   Impedance: 1150 ohms  Seizure Energy Index: 9221 V squared  Postictal Suppression Index: 93%  Seizure Concordance Index: 95%  Medications  Pre Shock: Zofran 4 mg Brevital 60 mg succinylcholine 80 mg  Post Shock:    Seizure Duration: 45 seconds by EMG 83 seconds by EEG   Comments: Treatment Monday  Lungs:  [x]   Clear to auscultation               []  Other:   Heart:    [x]   Regular rhythm             []  irregular rhythm    [x]   Previous H&P reviewed, patient examined and there are NO CHANGES                 []   Previous H&P reviewed, patient examined and there are changes noted.   Alethia Berthold, MD 9/13/201911:08 AM

## 2017-09-28 NOTE — Anesthesia Postprocedure Evaluation (Signed)
Anesthesia Post Note  Patient: Angela [name]  Procedure(s) Performed: ECT TX  Patient location during evaluation: PACU Anesthesia Type: General Level of consciousness: awake and alert Pain management: pain level controlled Vital Signs Assessment: post-procedure vital signs reviewed and stable Respiratory status: spontaneous breathing, nonlabored ventilation, respiratory function stable and patient connected to nasal cannula oxygen Cardiovascular status: blood pressure returned to baseline and stable Postop Assessment: no apparent nausea or vomiting Anesthetic complications: no     Last Vitals:  Vitals:   09/28/17 1155 09/28/17 1200  BP: (!) 159/78   Pulse: 68 66  Resp: 17 (!) 23  Temp: 36.5 C   SpO2: 98% 99%    Last Pain:  Vitals:   09/28/17 1145  TempSrc:   PainSc: 10-Worst pain ever                 [name]

## 2017-09-28 NOTE — Progress Notes (Signed)
Recreation Therapy Notes  Date: 09/28/2017  Time: 9:30 pm   Location: Craft Room   Behavioral response: N/A   Intervention Topic: Leisure  Discussion/Intervention: Patient did not attend group.   Clinical Observations/Feedback:  Patient did not attend group.   Marykay Mccleod LRT/CTRS        Duron Meister 09/28/2017 11:42 AM

## 2017-09-28 NOTE — [facility] Group Notes (Signed)

## 2017-09-28 NOTE — Progress Notes (Signed)
Received Angela [name] from ECT, she went to bed and took a nap. She woke up irritable, c/o a headache, upset stomach and feeling suicidal. She does not have a plan. She feels safe here on the unit.  Her vital signs were taken, received some of her scheduled medications and ate some of her lunch without vomiting. Afterwards she returned to her bed.

## 2017-09-28 NOTE — [facility] Group Notes (Signed)
[facility] Group Notes:  (Nursing/MHT/Case Management/Adjunct)  Date:  09/28/2017  Time:  1:17 AM  Type of Therapy:  Group Therapy  Participation Level:  Active  Participation Quality:  Appropriate  Affect:  Appropriate  Cognitive:  Appropriate  Insight:  Appropriate  Engagement in Group:  Engaged  Modes of Intervention:  Discussion  Summary of Progress/Problems: MHT reviewed rules and expectations of the unit. 1. Visitation hours and number of visitors at one time 2. Phone hours 3. Where to go for vitals and the time it would be called for 4. Informed of routine checks and encouraged to cover appropriately as MHT's would be looking in on them at night 5. No sharing clothes or items 6. No touching, hugging, doing other patient's hair 7. No going in other patient's room 8. No walking down halls other than your own 9. No use of last names on unit MHT handed out snack menu and explained how snack would be distributed MHT reminded patients not to take food or drink, other than water to the rooms MHT explained why food was not allowed in rooms MHT was called out to assist with a patient on the unit MHT was unable to review goals with each patient. Angela [name] 09/28/2017, 1:17 AM

## 2017-09-28 NOTE — Anesthesia Post-op Follow-up Note (Signed)
Anesthesia QCDR form completed.        

## 2017-09-29 MED ORDER — HYDROCORTISONE 0.5 % EX CREA
TOPICAL_CREAM | Freq: Four times a day (QID) | CUTANEOUS | Status: DC | PRN
  Administered 2017-09-29: 23:00:00 via TOPICAL
  Filled 2017-09-29: qty 28.35

## 2017-09-29 MED ORDER — FLUCONAZOLE 100 MG PO TABS
100.0000 mg | ORAL_TABLET | Freq: Once | ORAL | Status: AC
  Administered 2017-09-29: 100 mg via ORAL
  Filled 2017-09-29: qty 1

## 2017-09-29 NOTE — [facility] Group Notes (Signed)
LCSW Group Therapy Note  09/29/2017 1:15pm  Type of Therapy and Topic:  Group Therapy:  Healthy Self Image and Positive Change  Participation Level:  Active   Description of Group:  In this group, patients will compare and contrast their current "I am...." statements to the visions they identify as desirable for their lives.  Patients discuss fears and how they can make positive changes in their cognitions that will positively impact their behaviors.  Facilitator played a motivational 3-minute speech and patients were left with the task of thinking about what "I am...." statements they can start using in their lives immediately.  Therapeutic Goals: 1. Patient will state their current self-perception as expressed in an "I Am" statement 2. Patient will contrast this with their desired vision for their live 3. Patient will identify 3 fears that negatively impact their behavior 4. Patient will discuss cognitive distortions that stem from their fears 5. Patient will verbalize statements that challenge their cognitive distortions  Summary of Patient Progress:  The patient reported feeling "good today." The patient stated, "I have a good sense of humor" Patient discussed her fears and how she can make positive changes inhercognitions that will positively impact  her behaviors. Patient was able to discuss and process cognitive distortions that stem from  her fears. Patient actively and appropriately engaged in the group. Patient was able to provide support and validation to other group members. Patient practiced active listening when interacting with the facilitator and other group members.   Therapeutic Modalities Cognitive Behavioral Therapy Motivational Interviewing  Sreshta Cressler  [name], LCSW 09/29/2017 11:13 AM

## 2017-09-29 NOTE — Progress Notes (Signed)
Received Angela [name] this AM before breakfast, She requested medication for anxiety and her minipress. Later she was medicated with her scheduled medications. She continues to verbalize multiple compliant, such as general body aches, anxiety and an eye infection. She took a morning nap and woke up in a better mood. Occupational therapy staff visited her thi morning. She denied feeling suicidal this morning.

## 2017-09-29 NOTE — Plan of Care (Addendum)
Patient found awake in bed upon my arrival. Patient is visible and social later in the evening. Upon approach, patient immediately begins to complain that ECT did not work, that she feels so much worse, continued somatic complaints from generalized pain (body, muscles, throat, joints) to nausea and fever (afebrile). Reports she has been unable to eat since her treatment and is given a sandwich tray. Patient complains that snacks should be more varied and that chips and raisins should be offered. Requests to speak with dietician. After eating sandwich tray, patient attends group. Patient then eats a large snack. Continues to complain of nausea, Denies vomiting but requests medication and is given Zofran after several minutes of discussion about how she can only take phenergan. Patient reports "terrible anxiety" and is given Ativan PRN. Given Tylenol and Voltaren cream for pain. Patient requests missed earlier dose of Minipress and states she "should be" getting it at HS. Given that dose. Patient attitude remains negative and she makes statements indicating that she will never feel better. Denies SI/HI/AVH.  Approximately 0000, patient Producer, television/film/video and states, "Thank you so much! I feel so much better. After all you've done for me, I really am starting to feel like I'm getting myself back. I'm so grateful!" Patient affect is bright and smiling. Reports all medication is effective. Patient then returned to bed.  Requests to speak with SW and chaplain tomorrow. Will endorse to am shift. Q 15 minute checks maintained. Will continue to monitor throughout the shift. Patient slept 4.5 hours. Reports increased nightmares and restlessness. Patient expects that ECT should have solved this problem for her. Attempted education but patient is again in negative mood. Complains of fever, 98.5. Reports her temp is always 97.4. Patient is unreasonable. Complains of sore throat, given Tylenol. Compliant with 0700 medications.  Will endorse care to oncoming shift.  Problem: Education: Goal: Ability to make informed decisions regarding treatment will improve Outcome: Progressing   Problem: Coping: Goal: Coping ability will improve Outcome: Progressing   Problem: Medication: Goal: Compliance with prescribed medication regimen will improve Outcome: Progressing   Problem: Self-Concept: Goal: Will verbalize positive feelings about self Outcome: Progressing   Problem: Safety: Goal: Ability to remain free from injury will improve Outcome: Progressing
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
## 2017-09-29 NOTE — Progress Notes (Signed)
[facility] MD Progress Note  09/29/2017 3:52 PM Angela [name]  MRN:  [id number] Subjective: Patient endorses that after her first ECT she felt sore in her throat and has not seen a big change in her mood.  Continues to endorse that things overwhelm her, says she gets irritated by nursing taking time to give her medications.  Also endorses that there are times when she thinks about why this is happened to her.  Endorses depressed mood, depressive cognitions.  We have a discussion about how ECT takes time to effect a change. Principal Problem: Bipolar I disorder, most recent episode mixed, severe with psychotic features ([place]) Diagnosis:   Patient Active Problem List   Diagnosis Date Noted  . Bipolar I disorder, most recent episode mixed, severe with psychotic features ([name]) [F31.64] 09/18/2017  . Agitation [R45.1] 09/18/2017  . IBS (irritable bowel syndrome) [K58.9] 11/17/2016  . Bilateral carpal tunnel syndrome [G56.03] 11/17/2016  . Plantar fasciitis, bilateral [M72.2] 11/17/2016  . Chronic pain syndrome [G89.4] 11/17/2016  . Chronic hyponatremia [E87.1] 11/17/2016  . PTSD (post-traumatic stress disorder) [F43.10] 11/17/2016  . Hernia, hiatal [K44.9] 11/17/2016  . Vitamin D deficiency [E55.9] 11/17/2016  . Vitamin B12 deficiency [E53.8] 11/17/2016  . SIADH (syndrome of inappropriate ADH production) ([place]) [E22.2] 09/08/2016  . Cervical spondylosis with myelopathy [M47.12] 05/24/2016  . Post-concussion headache [G44.309] 05/24/2016  . MCI (mild cognitive impairment) with memory loss [G31.84] 03/30/2016  . Gait abnormality [R26.9] 03/30/2016  . Chronic bipolar affective disorder ([place]) [F31.9] 01/24/2016  . Anemia, iron deficiency [D50.9] 06/24/2015  . Endometriosis [N80.9] 06/24/2015  . Essential hypertension [I10] 06/24/2015  . History of postmenopausal bleeding [Z87.42] 05/22/2015  . History of TIA (transient ischemic attack) [Z86.73] 04/20/2015  . Hypothyroidism [E03.9] 04/20/2015  .  GERD (gastroesophageal reflux disease) [K21.9] 04/20/2015  . Cervical disc disorder with radiculopathy [M50.10] 03/25/2015  . Chronic neck pain [M54.2, G89.29] 03/25/2015  . Pelvic pain in female [R10.2] 10/30/2014  . History of skin cancer [Z85.828] 06/30/2014  . Chronic headaches [R51] 12/16/2013  . Neuropathy [G62.9] 12/16/2013   Total Time spent with patient: 30 minutes  Past Psychiatric History: Long history of bipolar disorder with mood instability multiple hospitalizations  Past Medical History:  Past Medical History:  Diagnosis Date  . Allergic rhinitis   . Anemia   . Blurred vision   . Depression   . Diverticulosis   . Endometriosis   . Falls   . GERD (gastroesophageal reflux disease)   . Hernia, hiatal   . Hypothyroidism   . IBS (irritable bowel syndrome)   . Malignant neoplasm of skin   . Migraine   . Neuropathy   . PTSD (post-traumatic stress disorder)   . Thyroid disease     Past Surgical History:  Procedure Laterality Date  . APPENDECTOMY    . CHOLECYSTECTOMY    . CHOLECYSTECTOMY, LAPAROSCOPIC    . ESOPHAGOGASTRODUODENOSCOPY (EGD) WITH PROPOFOL N/A 03/29/2015   Procedure: ESOPHAGOGASTRODUODENOSCOPY (EGD) WITH PROPOFOL;  Surgeon: Josefine Class, MD;  Location: [facility] ENDOSCOPY;  Service: Endoscopy;  Laterality: N/A;  . HERNIA REPAIR    . laproscopy    . TONSILLECTOMY    . uteral suspension     Family History:  Family History  Problem Relation Age of Onset  . Heart disease Father   . Cancer Mother        lung  . Urolithiasis Neg Hx   . Kidney disease Neg Hx   . Kidney cancer Neg Hx   . Prostate cancer  Neg Hx    Family Psychiatric  History:  Positive for bipolar disorder Social History:  Social History   Substance and Sexual Activity  Alcohol Use No     Social History   Substance and Sexual Activity  Drug Use No    Social History   Socioeconomic History  . Marital status: Single    Spouse name: Not on file  . Number of children: 0   . Years of education: 1.5 years of college  . Highest education level: Not on file  Occupational History  . Occupation: Retired  Scientific laboratory technician  . Financial resource strain: Not on file  . Food insecurity:    Worry: Not on file    Inability: Not on file  . Transportation needs:    Medical: Not on file    Non-medical: Not on file  Tobacco Use  . Smoking status: Former Smoker    Last attempt to quit: 11/17/1976    Years since quitting: 40.8  . Smokeless tobacco: Never Used  Substance and Sexual Activity  . Alcohol use: No  . Drug use: No  . Sexual activity: Not Currently  Lifestyle  . Physical activity:    Days per week: Not on file    Minutes per session: Not on file  . Stress: Not on file  Relationships  . Social connections:    Talks on phone: Not on file    Gets together: Not on file    Attends religious service: Not on file    Active member of club or organization: Not on file    Attends meetings of clubs or organizations: Not on file    Relationship status: Not on file  Other Topics Concern  . Not on file  Social History Narrative   Lives at home alone.   Right-handed.   No daily caffeine use.   Additional Social History:    Pain Medications: See PTA Prescriptions: See PTA Over the Counter: See PTA History of alcohol / drug use?: No history of alcohol / drug abuse Longest period of sobriety (when/how long): Reports of none Negative Consequences of Use: (n/a) Withdrawal Symptoms: (n/a)                    Sleep: Fair  Appetite:  Fair  Current Medications: Current Facility-Administered Medications  Medication Dose Route Frequency Provider Last Rate Last Dose  . acetaminophen (TYLENOL) tablet 650 mg  650 mg Oral Q6H PRN Clapacs, Madie Reno, MD   650 mg at 09/29/17 2952  . alum & mag hydroxide-simeth (MAALOX/MYLANTA) 200-200-20 MG/5ML suspension 30 mL  30 mL Oral Q4H PRN Clapacs, John T, MD   30 mL at 09/28/17 1416  . amLODipine (NORVASC) tablet 5 mg  5  mg Oral BID Clapacs, Madie Reno, MD   5 mg at 09/27/17 1609  . calcium carbonate (TUMS - dosed in mg elemental calcium) chewable tablet 500 mg of elemental calcium  500 mg of elemental calcium Oral BID WC Pucilowska, Jolanta B, MD   500 mg of elemental calcium at 09/29/17 0834  . cholecalciferol (VITAMIN D) tablet 1,000 Units  1,000 Units Oral Daily Pucilowska, Jolanta B, MD   1,000 Units at 09/29/17 0833  . cycloSPORINE (RESTASIS) 0.05 % ophthalmic emulsion 1 drop  1 drop Both Eyes BID Clapacs, Madie Reno, MD   1 drop at 09/29/17 0835  . diclofenac sodium (VOLTAREN) 1 % transdermal gel 4 g  4 g Topical QID Pucilowska, Wardell Honour, MD  4 g at 09/29/17 0857  . ferrous sulfate tablet 325 mg  325 mg Oral Q breakfast Pucilowska, Jolanta B, MD   325 mg at 09/29/17 0832  . gabapentin (NEURONTIN) capsule 100 mg  100 mg Oral BID Clapacs, Madie Reno, MD   100 mg at 09/29/17 8756  . hydrOXYzine (ATARAX/VISTARIL) tablet 50 mg  50 mg Oral TID PRN Clapacs, Madie Reno, MD   50 mg at 09/28/17 1418  . ketorolac (TORADOL) 30 MG/ML injection 30 mg  30 mg Intravenous Once Clapacs, John T, MD      . levothyroxine (SYNTHROID, LEVOTHROID) tablet 100 mcg  100 mcg Oral QAC breakfast Pucilowska, Jolanta B, MD   100 mcg at 09/29/17 0627  . lidocaine (LIDODERM) 5 % 1 patch  1 patch Transdermal Q24H Pucilowska, Jolanta B, MD   1 patch at 09/29/17 0841  . linaclotide (LINZESS) capsule 290 mcg  290 mcg Oral QAC breakfast Pucilowska, Jolanta B, MD   290 mcg at 09/29/17 0858  . LORazepam (ATIVAN) tablet 0.5 mg  0.5 mg Oral Q6H PRN Clapacs, Madie Reno, MD   0.5 mg at 09/29/17 0741  . magnesium hydroxide (MILK OF MAGNESIA) suspension 30 mL  30 mL Oral Daily PRN Clapacs, Madie Reno, MD   30 mL at 09/22/17 0920  . magnesium oxide (MAG-OX) tablet 200 mg  200 mg Oral BID Pucilowska, Jolanta B, MD   200 mg at 09/29/17 0835  . multivitamin with minerals tablet 1 tablet  1 tablet Oral Daily Pucilowska, Jolanta B, MD   1 tablet at 09/29/17 0834  . omega-3 acid ethyl  esters (LOVAZA) capsule 1 g  1 g Oral BID Pucilowska, Jolanta B, MD   1 g at 09/29/17 0834  . ondansetron (ZOFRAN-ODT) disintegrating tablet 4 mg  4 mg Oral Q8H PRN Pucilowska, Jolanta B, MD   4 mg at 09/27/17 1605  . pantoprazole (PROTONIX) EC tablet 40 mg  40 mg Oral Daily Pucilowska, Jolanta B, MD   40 mg at 09/29/17 0627  . PARoxetine (PAXIL-CR) 24 hr tablet 25 mg  25 mg Oral Daily Pucilowska, Jolanta B, MD   25 mg at 09/29/17 0832  . prazosin (MINIPRESS) capsule 2 mg  2 mg Oral BID Pucilowska, Jolanta B, MD   2 mg at 09/29/17 0858  . tiZANidine (ZANAFLEX) tablet 2 mg  2 mg Oral Q8H PRN Pucilowska, Jolanta B, MD      . traZODone (DESYREL) tablet 50 mg  50 mg Oral QHS Pucilowska, Jolanta B, MD   50 mg at 09/28/17 2209    Lab Results:  Results for orders placed or performed during the hospital encounter of 09/19/17 (from the past 48 hour(s))  Glucose, capillary     Status: None   Collection Time: 09/28/17  6:36 AM  Result Value Ref Range   Glucose-Capillary 80 70 - 99 mg/dL    Blood Alcohol level:  Lab Results  Component Value Date   ETH <5 09/29/2016   ETH <5 43/32/9518    Metabolic Disorder Labs: Lab Results  Component Value Date   HGBA1C 5.7 (H) 09/20/2017   MPG 116.89 09/20/2017   No results found for: PROLACTIN Lab Results  Component Value Date   CHOL 173 09/20/2017   TRIG 193 (H) 09/20/2017   HDL 50 09/20/2017   CHOLHDL 3.5 09/20/2017   VLDL 39 09/20/2017   LDLCALC 84 09/20/2017   LDLCALC 87 09/04/2017    Physical Findings: AIMS:  , ,  ,  ,  CIWA:    COWS:     Musculoskeletal: Strength & Muscle Tone: within normal limits Gait & Station: shuffle Patient leans: N/A  Psychiatric Specialty Exam: Physical Exam  Nursing note and vitals reviewed. Constitutional: She appears well-developed.  HENT:  Head: Normocephalic and atraumatic.  Eyes: Pupils are equal, round, and reactive to light. Conjunctivae are normal.  Neck: Normal range of motion.   Cardiovascular: Regular rhythm and normal heart sounds.  Respiratory: Effort normal.  GI: Soft.  Musculoskeletal: Normal range of motion.  Neurological: She is alert.  Skin: Skin is warm and dry.  Psychiatric: Her mood appears anxious. Her affect is labile. Her speech is tangential. She is agitated. She is not aggressive. Thought content is not paranoid. Cognition and memory are impaired. She expresses impulsivity. She exhibits a depressed mood. She expresses suicidal ideation. She expresses no homicidal ideation. She expresses no suicidal plans.    Review of Systems  Constitutional: Negative.   HENT: Negative.   Eyes: Negative.   Respiratory: Negative.   Cardiovascular: Negative.   Gastrointestinal: Negative.   Musculoskeletal: Positive for back pain and joint pain.  Skin: Negative.   Neurological: Negative.   Psychiatric/Behavioral: Positive for depression, memory loss and suicidal ideas. Negative for hallucinations and substance abuse. The patient is nervous/anxious and has insomnia.     Blood pressure (!) 94/58, pulse 65, temperature 97.9 F (36.6 C), temperature source Oral, resp. rate 18, height 5\' 3"  (1.6 m), weight 60.8 kg, SpO2 95 %.Body mass index is 23.74 kg/m.  General Appearance: Disheveled  Eye Contact:  Fair  Speech:  Garbled  Volume:  Increased  Mood:  Anxious, Depressed and Dysphoric  Affect:  Congruent  Thought Process:  Disorganized  Orientation:  Full (Time, Place, and Person)  Thought Content:  Rumination and Tangential  Suicidal Thoughts:  Yes.  without intent/plan  Homicidal Thoughts:  No  Memory:  Immediate;   Fair Recent;   Poor Remote;   Fair  Judgement:  Impaired  Insight:  Shallow  Psychomotor Activity:  Restlessness  Concentration:  Concentration: Poor  Recall:  [name]:  Fair  Language:  Fair  Akathisia:  No  Handed:  Right  AIMS (if indicated):     Assets:  Communication Skills Desire for Improvement  ADL's:  Impaired   Cognition:  Impaired,  Mild  Sleep:  Number of Hours: 4.5     Treatment Plan Summary: 67 year old female with a previous history of bipolar disorder now presenting with a severe depressive episode.  Has started ECT, one session over.  #Bipolar disorder, severe depressive episode without psychotic symptoms -Continue ECT -Continue Paxil 25 mg daily -Continue prazosin 2 mg 2 times daily -Continue trazodone 50 mg at bedtime  -Continue Linzess to 90 mcg daily before breakfast -Continue Protonix 40 mg daily -Continue PO Lorazepam 0.5 mg every 6 hours as needed for anxiety -Continue levothyroxine 100 mcg daily -Hydroxyzine 50 mg 3 times daily as needed for anxiety -To new gabapentin 100 mg twice daily  Ramond Dial, MD 09/29/2017, 3:52 PM

## 2017-09-30 MED ORDER — TRIAMCINOLONE ACETONIDE 0.1 % EX CREA
TOPICAL_CREAM | Freq: Three times a day (TID) | CUTANEOUS | Status: DC | PRN
  Administered 2017-10-11 – 2017-10-12 (×3): via TOPICAL
  Administered 2017-10-14 – 2017-10-15 (×2): 1 via TOPICAL
  Filled 2017-09-30 (×2): qty 15

## 2017-09-30 MED ORDER — POLYVINYL ALCOHOL 1.4 % OP SOLN
1.0000 [drp] | OPHTHALMIC | Status: DC | PRN
  Administered 2017-09-30 – 2017-11-05 (×30): 1 [drp] via OPHTHALMIC
  Filled 2017-09-30: qty 15

## 2017-09-30 NOTE — Progress Notes (Signed)
Received Angela [name] this AM in her room asleep. She slept through breakfast, therefore she received her morning medications later. She has been OOB in the milieu at intervals and continue to be very needy most of there time.

## 2017-09-30 NOTE — [facility] Group Notes (Signed)
LCSW Group Therapy Note 09/30/2017 1:15pm  Type of Therapy and Topic: Group Therapy: Feelings Around Returning Home & Establishing a Supportive Framework and Supporting Oneself When Supports Not Available  Participation Level: Active  Description of Group:  Patients first processed thoughts and feelings about upcoming discharge. These included fears of upcoming changes, lack of change, new living environments, judgements and expectations from others and overall stigma of mental health issues. The group then discussed the definition of a supportive framework, what that looks and feels like, and how do to discern it from an unhealthy non-supportive network. The group identified different types of supports as well as what to do when your family/friends are less than helpful or unavailable  Therapeutic Goals  1. Patient will identify one healthy supportive network that they can use at discharge. 2. Patient will identify one factor of a supportive framework and how to tell it from an unhealthy network. 3. Patient able to identify one coping skill to use when they do not have positive supports from others. 4. Patient will demonstrate ability to communicate their needs through discussion and/or role plays.  Summary of Patient Progress:  Patient reports she is "okay." Pt engaged during group session. As patients processed their anxiety about discharge and described healthy supports patient shared she is not ready to be discharge because she has no where to go and feels "like dying." Pt states she has no support system.  Patients identified at least one self-care tool they were willing to use after discharge; cooking, making, jokes.   Therapeutic Modalities Cognitive Behavioral Therapy Motivational Interviewing   Angela [name]  [name], LCSW 09/30/2017 10:06 AM

## 2017-09-30 NOTE — Progress Notes (Signed)
[facility] MD Progress Note  09/30/2017 1:59 PM Angela [name]  MRN:  [id number] Subjective: Patient continues to endorse depressed mood and feeling overwhelmed.  Looks forward to remaining ECTs.  Would like lubricant eyedrops to be present for her.  Denies any side effects from her other medications.  Does not want to take Vistaril.  Denies any suicidal thoughts. Principal Problem: Bipolar I disorder, most recent episode mixed, severe with psychotic features ([place]) Diagnosis:   Patient Active Problem List   Diagnosis Date Noted  . Bipolar I disorder, most recent episode mixed, severe with psychotic features ([place]) [F31.64] 09/18/2017  . Agitation [R45.1] 09/18/2017  . IBS (irritable bowel syndrome) [K58.9] 11/17/2016  . Bilateral carpal tunnel syndrome [G56.03] 11/17/2016  . Plantar fasciitis, bilateral [M72.2] 11/17/2016  . Chronic pain syndrome [G89.4] 11/17/2016  . Chronic hyponatremia [E87.1] 11/17/2016  . PTSD (post-traumatic stress disorder) [F43.10] 11/17/2016  . Hernia, hiatal [K44.9] 11/17/2016  . Vitamin D deficiency [E55.9] 11/17/2016  . Vitamin B12 deficiency [E53.8] 11/17/2016  . SIADH (syndrome of inappropriate ADH production) ([place]) [E22.2] 09/08/2016  . Cervical spondylosis with myelopathy [M47.12] 05/24/2016  . Post-concussion headache [G44.309] 05/24/2016  . MCI (mild cognitive impairment) with memory loss [G31.84] 03/30/2016  . Gait abnormality [R26.9] 03/30/2016  . Chronic bipolar affective disorder ([place]) [F31.9] 01/24/2016  . Anemia, iron deficiency [D50.9] 06/24/2015  . Endometriosis [N80.9] 06/24/2015  . Essential hypertension [I10] 06/24/2015  . History of postmenopausal bleeding [Z87.42] 05/22/2015  . History of TIA (transient ischemic attack) [Z86.73] 04/20/2015  . Hypothyroidism [E03.9] 04/20/2015  . GERD (gastroesophageal reflux disease) [K21.9] 04/20/2015  . Cervical disc disorder with radiculopathy [M50.10] 03/25/2015  . Chronic neck pain [M54.2, G89.29]  03/25/2015  . Pelvic pain in female [R10.2] 10/30/2014  . History of skin cancer [Z85.828] 06/30/2014  . Chronic headaches [R51] 12/16/2013  . Neuropathy [G62.9] 12/16/2013   Total Time spent with patient: 30 minutes  Past Psychiatric History: Long history of bipolar disorder with mood instability multiple hospitalizations  Past Medical History:  Past Medical History:  Diagnosis Date  . Allergic rhinitis   . Anemia   . Blurred vision   . Depression   . Diverticulosis   . Endometriosis   . Falls   . GERD (gastroesophageal reflux disease)   . Hernia, hiatal   . Hypothyroidism   . IBS (irritable bowel syndrome)   . Malignant neoplasm of skin   . Migraine   . Neuropathy   . PTSD (post-traumatic stress disorder)   . Thyroid disease     Past Surgical History:  Procedure Laterality Date  . APPENDECTOMY    . CHOLECYSTECTOMY    . CHOLECYSTECTOMY, LAPAROSCOPIC    . ESOPHAGOGASTRODUODENOSCOPY (EGD) WITH PROPOFOL N/A 03/29/2015   Procedure: ESOPHAGOGASTRODUODENOSCOPY (EGD) WITH PROPOFOL;  Surgeon: Josefine Class, MD;  Location: [facility] ENDOSCOPY;  Service: Endoscopy;  Laterality: N/A;  . HERNIA REPAIR    . laproscopy    . TONSILLECTOMY    . uteral suspension     Family History:  Family History  Problem Relation Age of Onset  . Heart disease Father   . Cancer Mother        lung  . Urolithiasis Neg Hx   . Kidney disease Neg Hx   . Kidney cancer Neg Hx   . Prostate cancer Neg Hx    Family Psychiatric  History:  Positive for bipolar disorder Social History:  Social History   Substance and Sexual Activity  Alcohol Use No     Social  History   Substance and Sexual Activity  Drug Use No    Social History   Socioeconomic History  . Marital status: Single    Spouse name: Not on file  . Number of children: 0  . Years of education: 1.5 years of college  . Highest education level: Not on file  Occupational History  . Occupation: Retired  Scientific laboratory technician  . Financial  resource strain: Not on file  . Food insecurity:    Worry: Not on file    Inability: Not on file  . Transportation needs:    Medical: Not on file    Non-medical: Not on file  Tobacco Use  . Smoking status: Former Smoker    Last attempt to quit: 11/17/1976    Years since quitting: 40.8  . Smokeless tobacco: Never Used  Substance and Sexual Activity  . Alcohol use: No  . Drug use: No  . Sexual activity: Not Currently  Lifestyle  . Physical activity:    Days per week: Not on file    Minutes per session: Not on file  . Stress: Not on file  Relationships  . Social connections:    Talks on phone: Not on file    Gets together: Not on file    Attends religious service: Not on file    Active member of club or organization: Not on file    Attends meetings of clubs or organizations: Not on file    Relationship status: Not on file  Other Topics Concern  . Not on file  Social History Narrative   Lives at home alone.   Right-handed.   No daily caffeine use.   Additional Social History:    Pain Medications: See PTA Prescriptions: See PTA Over the Counter: See PTA History of alcohol / drug use?: No history of alcohol / drug abuse Longest period of sobriety (when/how long): Reports of none Negative Consequences of Use: (n/a) Withdrawal Symptoms: (n/a)                    Sleep: Fair  Appetite:  Fair  Current Medications: Current Facility-Administered Medications  Medication Dose Route Frequency Provider Last Rate Last Dose  . acetaminophen (TYLENOL) tablet 650 mg  650 mg Oral Q6H PRN Clapacs, Madie Reno, MD   650 mg at 09/30/17 0619  . alum & mag hydroxide-simeth (MAALOX/MYLANTA) 200-200-20 MG/5ML suspension 30 mL  30 mL Oral Q4H PRN Clapacs, John T, MD   30 mL at 09/28/17 1416  . amLODipine (NORVASC) tablet 5 mg  5 mg Oral BID Clapacs, Madie Reno, MD   5 mg at 09/30/17 0618  . calcium carbonate (TUMS - dosed in mg elemental calcium) chewable tablet 500 mg of elemental calcium   500 mg of elemental calcium Oral BID WC Pucilowska, Jolanta B, MD   500 mg of elemental calcium at 09/30/17 1200  . cholecalciferol (VITAMIN D) tablet 1,000 Units  1,000 Units Oral Daily Pucilowska, Jolanta B, MD   1,000 Units at 09/30/17 1202  . cycloSPORINE (RESTASIS) 0.05 % ophthalmic emulsion 1 drop  1 drop Both Eyes BID Clapacs, Madie Reno, MD   1 drop at 09/30/17 1201  . diclofenac sodium (VOLTAREN) 1 % transdermal gel 4 g  4 g Topical QID Pucilowska, Jolanta B, MD   4 g at 09/30/17 1203  . ferrous sulfate tablet 325 mg  325 mg Oral Q breakfast Pucilowska, Jolanta B, MD   325 mg at 09/30/17 1200  . gabapentin (NEURONTIN) capsule  100 mg  100 mg Oral BID Clapacs, John T, MD   100 mg at 09/30/17 1200  . ketorolac (TORADOL) 30 MG/ML injection 30 mg  30 mg Intravenous Once Clapacs, John T, MD      . levothyroxine (SYNTHROID, LEVOTHROID) tablet 100 mcg  100 mcg Oral QAC breakfast Pucilowska, Jolanta B, MD   100 mcg at 09/30/17 0619  . lidocaine (LIDODERM) 5 % 1 patch  1 patch Transdermal Q24H Pucilowska, Jolanta B, MD   1 patch at 09/30/17 1204  . linaclotide (LINZESS) capsule 290 mcg  290 mcg Oral QAC breakfast Pucilowska, Jolanta B, MD   290 mcg at 09/30/17 0619  . LORazepam (ATIVAN) tablet 0.5 mg  0.5 mg Oral Q6H PRN Clapacs, Madie Reno, MD   0.5 mg at 09/29/17 0741  . magnesium hydroxide (MILK OF MAGNESIA) suspension 30 mL  30 mL Oral Daily PRN Clapacs, Madie Reno, MD   30 mL at 09/22/17 0920  . magnesium oxide (MAG-OX) tablet 200 mg  200 mg Oral BID Pucilowska, Jolanta B, MD   200 mg at 09/30/17 1201  . multivitamin with minerals tablet 1 tablet  1 tablet Oral Daily Pucilowska, Jolanta B, MD   1 tablet at 09/30/17 1201  . omega-3 acid ethyl esters (LOVAZA) capsule 1 g  1 g Oral BID Pucilowska, Jolanta B, MD   1 g at 09/30/17 1200  . ondansetron (ZOFRAN-ODT) disintegrating tablet 4 mg  4 mg Oral Q8H PRN Pucilowska, Jolanta B, MD   4 mg at 09/27/17 1605  . pantoprazole (PROTONIX) EC tablet 40 mg  40 mg Oral  Daily Pucilowska, Jolanta B, MD   40 mg at 09/30/17 0619  . PARoxetine (PAXIL-CR) 24 hr tablet 25 mg  25 mg Oral Daily Pucilowska, Jolanta B, MD   25 mg at 09/30/17 1200  . polyvinyl alcohol (LIQUIFILM TEARS) 1.4 % ophthalmic solution 1 drop  1 drop Both Eyes PRN Ramond Dial, MD      . prazosin (MINIPRESS) capsule 2 mg  2 mg Oral BID Pucilowska, Jolanta B, MD   2 mg at 09/30/17 1202  . tiZANidine (ZANAFLEX) tablet 2 mg  2 mg Oral Q8H PRN Pucilowska, Jolanta B, MD   2 mg at 09/29/17 2243  . traZODone (DESYREL) tablet 50 mg  50 mg Oral QHS Pucilowska, Jolanta B, MD   50 mg at 09/29/17 2128  . triamcinolone cream (KENALOG) 0.1 %   Topical TID PRN Ramond Dial, MD        Lab Results:  No results found for this or any previous visit (from the past 48 hour(s)).  Blood Alcohol level:  Lab Results  Component Value Date   ETH <5 09/29/2016   ETH <5 35/59/7416    Metabolic Disorder Labs: Lab Results  Component Value Date   HGBA1C 5.7 (H) 09/20/2017   MPG 116.89 09/20/2017   No results found for: PROLACTIN Lab Results  Component Value Date   CHOL 173 09/20/2017   TRIG 193 (H) 09/20/2017   HDL 50 09/20/2017   CHOLHDL 3.5 09/20/2017   VLDL 39 09/20/2017   LDLCALC 84 09/20/2017   LDLCALC 87 09/04/2017    Physical Findings: AIMS:  , ,  ,  ,    CIWA:    COWS:     Musculoskeletal: Strength & Muscle Tone: within normal limits Gait & Station: shuffle Patient leans: N/A  Psychiatric Specialty Exam: Physical Exam  Nursing note and vitals reviewed. Constitutional: She appears well-developed.  HENT:  Head:  Normocephalic and atraumatic.  Eyes: Pupils are equal, round, and reactive to light. Conjunctivae are normal.  Neck: Normal range of motion.  Cardiovascular: Regular rhythm and normal heart sounds.  Respiratory: Effort normal.  GI: Soft.  Musculoskeletal: Normal range of motion.  Neurological: She is alert.  Psychiatric: She is not agitated and not aggressive.  Thought content is not paranoid. She exhibits a depressed mood. She expresses no homicidal ideation. She expresses no suicidal plans.    Review of Systems  Constitutional: Negative.   HENT: Negative.   Eyes: Negative.   Respiratory: Negative.   Cardiovascular: Negative.   Gastrointestinal: Negative.   Musculoskeletal: Positive for back pain and joint pain.  Skin: Negative.   Neurological: Negative.   Psychiatric/Behavioral: Positive for depression, memory loss and suicidal ideas. Negative for hallucinations and substance abuse. The patient is nervous/anxious and has insomnia.     Blood pressure 135/77, pulse 69, temperature 97.9 F (36.6 C), temperature source Oral, resp. rate 18, height 5\' 3"  (1.6 m), weight 60.8 kg, SpO2 98 %.Body mass index is 23.74 kg/m.  General Appearance: Disheveled  Eye Contact:  Fair  Speech:  Garbled  Volume:  Increased  Mood:  Anxious, Depressed and Dysphoric  Affect:  Congruent  Thought Process:  organized  Orientation:  Full (Time, Place, and Person)  Thought Content:  Depressive cognitions  Suicidal Thoughts:  Yes.  without intent/plan  Homicidal Thoughts:  No  Memory:  Immediate;   Fair Recent;   Poor Remote;   Fair  Judgement:  Impaired  Insight:  Shallow  Psychomotor Activity:  Restlessness  Concentration:  Concentration: Poor  Recall:  [name]:  Fair  Language:  Fair  Akathisia:  No  Handed:  Right  AIMS (if indicated):     Assets:  Communication Skills Desire for Improvement  ADL's:  Impaired  Cognition:  Impaired,  Mild  Sleep:  Number of Hours: 6.5     Treatment Plan Summary: 67 year old female with a previous history of bipolar disorder now presenting with a severe depressive episode.  Has started ECT, one session over.  #Bipolar disorder, severe depressive episode without psychotic symptoms -Continue ECT -Continue Paxil 25 mg daily -Continue prazosin 2 mg 2 times daily -Continue trazodone 50 mg at  bedtime  -Continue Linzess to 90 mcg daily before breakfast -Continue Protonix 40 mg daily -Continue PO Lorazepam 0.5 mg every 6 hours as needed for anxiety -Continue levothyroxine 100 mcg daily -Hydroxyzine D/Ced -To new gabapentin 100 mg twice daily  #Vaginal candidiasis -Diflucan 150 mg one-time dose.  Ramond Dial, MD 09/30/2017, 1:59 PM

## 2017-09-30 NOTE — [facility] Group Notes (Signed)
[facility] Group Notes:  (Nursing/MHT/Case Management/Adjunct)  Date:  09/30/2017  Time:  12:42 AM  Type of Therapy:  Group Therapy  Participation Level:  Did Not Attend   Angela [name] 09/30/2017, 12:42 AM

## 2017-09-30 NOTE — Plan of Care (Addendum)
Patient found in common area upon my arrival. Patient is irritable, nasty, and demanding towards staff throughout the evening. Patient orders staff around like we are her personal staff and gets irritated when writer attempts to spend any time with other patients. Patient requests female staff bring her clothes from the dryer while she stands nude in her room. Becomes angry and belligerent when writer performs this task for her instead. Patient wants staff to bring her clothes without touching them. Patient had screaming outburst in medication room when told that she was not prescribed certain eye gtts. Patient was asked to stop berating and screaming at staff. She stomped off swinging and banging her walker and screaming and slammed the door. Patient came out an hour later and treated writer with respect and was cooperative and compliant with HS medications. Patient later came to apologize for her behavior stating that God had spoken to her and told her that she should apologize. Patient then complained of itching spots on her BUEs, torso, and face. Dr. Davonna Belling contacted. Hydrocortisone cream ordered and administered with positive results. Patient complains of rib pain @2120 , given Tylenol with positive results. Complains of generalized muscle cramps, given Zanaflex with positive results. Denies SI/HI/AVH. Appetite observed to be good. Q 15 minute checks maintained. Will continue to monitor throughout the shift. Patient slept 6.5 hours. Reports restlessness. BP 135/77, patient requests and is given 0800 Amlodipine early after raising her voice once again. Compliant with 0700 medication. Complains of headache, given Tylenol. Will endorse care to oncoming shift.  Problem: Coping: Goal: Coping ability will improve Outcome: Progressing   Problem: Medication: Goal: Compliance with prescribed medication regimen will improve Outcome: Progressing   Problem: Self-Concept: Goal: Will verbalize positive feelings  about self Outcome: Progressing   Problem: Safety: Goal: Ability to remain free from injury will improve Outcome: Progressing

## 2017-09-30 NOTE — Plan of Care (Signed)
Pt. Denies SI/Hi and verbally is able to contract for safety. Pt. Continues to have a negative attitude and reports horrible day today. Pt. Reports anxiety and depression at, "9/10" for both. Pt. Continues to not refuse medications such as her Voltaren topical cream, stating, "It has made my skin break out all over damn it!". Pt. Skin on hands patient is reporting break out was assessed and is WDL. Will notify treatment team of patient medications refusal. Pt. Asked to think of a positive goal is not able to state anything positive.    Problem: Coping: Goal: Coping ability will improve Outcome: Not Progressing   Problem: Medication: Goal: Compliance with prescribed medication regimen will improve Outcome: Not Progressing   Problem: Self-Concept: Goal: Will verbalize positive feelings about self Outcome: Not Progressing   Problem: Self-Concept: Goal: Ability to disclose and discuss suicidal ideas will improve Outcome: Progressing   Problem: Safety: Goal: Ability to remain free from injury will improve Outcome: Progressing

## 2017-10-01 ENCOUNTER — Encounter: Payer: Self-pay | Admitting: Anesthesiology

## 2017-10-01 ENCOUNTER — Other Ambulatory Visit: Payer: Self-pay | Admitting: Psychiatry

## 2017-10-01 ENCOUNTER — Inpatient Hospital Stay: Payer: Medicare Other | Admitting: Anesthesiology

## 2017-10-01 LAB — GLUCOSE, CAPILLARY: GLUCOSE-CAPILLARY: 81 mg/dL (ref 70–99)

## 2017-10-01 MED ORDER — SUCCINYLCHOLINE CHLORIDE 20 MG/ML IJ SOLN
INTRAMUSCULAR | Status: AC
  Filled 2017-10-01: qty 1

## 2017-10-01 MED ORDER — SODIUM CHLORIDE 0.9 % IV SOLN
500.0000 mL | Freq: Once | INTRAVENOUS | Status: AC
  Administered 2017-10-01: 500 mL via INTRAVENOUS

## 2017-10-01 MED ORDER — KETOROLAC TROMETHAMINE 30 MG/ML IJ SOLN
INTRAMUSCULAR | Status: AC
  Filled 2017-10-01: qty 1

## 2017-10-01 MED ORDER — METHOHEXITAL SODIUM 100 MG/10ML IV SOSY
PREFILLED_SYRINGE | INTRAVENOUS | Status: DC | PRN
  Administered 2017-10-01: 80 mg via INTRAVENOUS

## 2017-10-01 MED ORDER — SUCCINYLCHOLINE CHLORIDE 200 MG/10ML IV SOSY
PREFILLED_SYRINGE | INTRAVENOUS | Status: DC | PRN
  Administered 2017-10-01: 80 mg via INTRAVENOUS

## 2017-10-01 MED ORDER — SODIUM CHLORIDE 0.9 % IV SOLN
INTRAVENOUS | Status: DC | PRN
  Administered 2017-10-01: 09:00:00 via INTRAVENOUS

## 2017-10-01 NOTE — Progress Notes (Signed)
Recreation Therapy Notes  Date: 10/01/2017  Time: 9:30 pm   Location: Craft Room   Behavioral response: N/A   Intervention Topic: Communication  Discussion/Intervention: Patient did not attend group.   Clinical Observations/Feedback:  Patient did not attend group.   Edie Vallandingham LRT/CTRS         Yalda Herd 10/01/2017 11:48 AM

## 2017-10-01 NOTE — Anesthesia Procedure Notes (Signed)
Performed by: Jazzie Trampe, CRNA Pre-anesthesia Checklist: Patient identified, Emergency Drugs available, Suction available and Patient being monitored Patient Re-evaluated:Patient Re-evaluated prior to induction Oxygen Delivery Method: Circle system utilized Preoxygenation: Pre-oxygenation with 100% oxygen Induction Type: IV induction Ventilation: Mask ventilation without difficulty and Mask ventilation throughout procedure Airway Equipment and Method: Bite block Placement Confirmation: positive ETCO2 Dental Injury: Teeth and Oropharynx as per pre-operative assessment        

## 2017-10-01 NOTE — Progress Notes (Signed)
[facility] MD Progress Note  10/01/2017 9:02 PM Angela [name]  MRN:  [id number] Subjective: Patient had her second ECT treatment today.  Treatment proceeded without any complication.  This evening I am pleased to hear her say that she actually feels like her mood is better.  Of course she follows this with about 20 minutes of talk about how miserable her life has been but she does not seem to be as disorganized as she was previously.  She is able to get it under control a little bit better.  Still has suicidal thoughts but without any intent or plan and does not appear to be obviously psychotic right now Principal Problem: Bipolar I disorder, most recent episode mixed, severe with psychotic features ([place]) Diagnosis:   Patient Active Problem List   Diagnosis Date Noted  . Bipolar I disorder, most recent episode mixed, severe with psychotic features ([place]) [F31.64] 09/18/2017  . Agitation [R45.1] 09/18/2017  . IBS (irritable bowel syndrome) [K58.9] 11/17/2016  . Bilateral carpal tunnel syndrome [G56.03] 11/17/2016  . Plantar fasciitis, bilateral [M72.2] 11/17/2016  . Chronic pain syndrome [G89.4] 11/17/2016  . Chronic hyponatremia [E87.1] 11/17/2016  . PTSD (post-traumatic stress disorder) [F43.10] 11/17/2016  . Hernia, hiatal [K44.9] 11/17/2016  . Vitamin D deficiency [E55.9] 11/17/2016  . Vitamin B12 deficiency [E53.8] 11/17/2016  . SIADH (syndrome of inappropriate ADH production) ([place]) [E22.2] 09/08/2016  . Cervical spondylosis with myelopathy [M47.12] 05/24/2016  . Post-concussion headache [G44.309] 05/24/2016  . MCI (mild cognitive impairment) with memory loss [G31.84] 03/30/2016  . Gait abnormality [R26.9] 03/30/2016  . Chronic bipolar affective disorder ([place]) [F31.9] 01/24/2016  . Anemia, iron deficiency [D50.9] 06/24/2015  . Endometriosis [N80.9] 06/24/2015  . Essential hypertension [I10] 06/24/2015  . History of postmenopausal bleeding [Z87.42] 05/22/2015  . History of TIA  (transient ischemic attack) [Z86.73] 04/20/2015  . Hypothyroidism [E03.9] 04/20/2015  . GERD (gastroesophageal reflux disease) [K21.9] 04/20/2015  . Cervical disc disorder with radiculopathy [M50.10] 03/25/2015  . Chronic neck pain [M54.2, G89.29] 03/25/2015  . Pelvic pain in female [R10.2] 10/30/2014  . History of skin cancer [Z85.828] 06/30/2014  . Chronic headaches [R51] 12/16/2013  . Neuropathy [G62.9] 12/16/2013   Total Time spent with patient: 30 minutes  Past Psychiatric History: Long history of mental health problems essentially going back her entire life with multiple hospitalizations  Past Medical History:  Past Medical History:  Diagnosis Date  . Allergic rhinitis   . Anemia   . Blurred vision   . Depression   . Diverticulosis   . Endometriosis   . Falls   . GERD (gastroesophageal reflux disease)   . Hernia, hiatal   . Hypothyroidism   . IBS (irritable bowel syndrome)   . Malignant neoplasm of skin   . Migraine   . Neuropathy   . PTSD (post-traumatic stress disorder)   . Thyroid disease     Past Surgical History:  Procedure Laterality Date  . APPENDECTOMY    . CHOLECYSTECTOMY    . CHOLECYSTECTOMY, LAPAROSCOPIC    . ESOPHAGOGASTRODUODENOSCOPY (EGD) WITH PROPOFOL N/A 03/29/2015   Procedure: ESOPHAGOGASTRODUODENOSCOPY (EGD) WITH PROPOFOL;  Surgeon: Josefine Class, MD;  Location: [facility] ENDOSCOPY;  Service: Endoscopy;  Laterality: N/A;  . HERNIA REPAIR    . laproscopy    . TONSILLECTOMY    . uteral suspension     Family History:  Family History  Problem Relation Age of Onset  . Heart disease Father   . Cancer Mother        lung  .  Urolithiasis Neg Hx   . Kidney disease Neg Hx   . Kidney cancer Neg Hx   . Prostate cancer Neg Hx    Family Psychiatric  History: Positive for mood symptoms and anxiety Social History:  Social History   Substance and Sexual Activity  Alcohol Use No     Social History   Substance and Sexual Activity  Drug Use No     Social History   Socioeconomic History  . Marital status: Single    Spouse name: Not on file  . Number of children: 0  . Years of education: 1.5 years of college  . Highest education level: Not on file  Occupational History  . Occupation: Retired  Scientific laboratory technician  . Financial resource strain: Not on file  . Food insecurity:    Worry: Not on file    Inability: Not on file  . Transportation needs:    Medical: Not on file    Non-medical: Not on file  Tobacco Use  . Smoking status: Former Smoker    Last attempt to quit: 11/17/1976    Years since quitting: 40.8  . Smokeless tobacco: Never Used  Substance and Sexual Activity  . Alcohol use: No  . Drug use: No  . Sexual activity: Not Currently  Lifestyle  . Physical activity:    Days per week: Not on file    Minutes per session: Not on file  . Stress: Not on file  Relationships  . Social connections:    Talks on phone: Not on file    Gets together: Not on file    Attends religious service: Not on file    Active member of club or organization: Not on file    Attends meetings of clubs or organizations: Not on file    Relationship status: Not on file  Other Topics Concern  . Not on file  Social History Narrative   Lives at home alone.   Right-handed.   No daily caffeine use.   Additional Social History:    Pain Medications: See PTA Prescriptions: See PTA Over the Counter: See PTA History of alcohol / drug use?: No history of alcohol / drug abuse Longest period of sobriety (when/how long): Reports of none Negative Consequences of Use: (n/a) Withdrawal Symptoms: (n/a)                    Sleep: Fair  Appetite:  Fair  Current Medications: Current Facility-Administered Medications  Medication Dose Route Frequency Provider Last Rate Last Dose  . acetaminophen (TYLENOL) tablet 650 mg  650 mg Oral Q6H PRN Clapacs, Madie Reno, MD   650 mg at 09/30/17 0619  . alum & mag hydroxide-simeth (MAALOX/MYLANTA) 200-200-20  MG/5ML suspension 30 mL  30 mL Oral Q4H PRN Clapacs, John T, MD   30 mL at 09/28/17 1416  . amLODipine (NORVASC) tablet 5 mg  5 mg Oral BID Clapacs, Madie Reno, MD   5 mg at 10/01/17 1652  . calcium carbonate (TUMS - dosed in mg elemental calcium) chewable tablet 500 mg of elemental calcium  500 mg of elemental calcium Oral BID WC Pucilowska, Jolanta B, MD   500 mg of elemental calcium at 10/01/17 1653  . cholecalciferol (VITAMIN D) tablet 1,000 Units  1,000 Units Oral Daily Pucilowska, Jolanta B, MD   1,000 Units at 09/30/17 1202  . cycloSPORINE (RESTASIS) 0.05 % ophthalmic emulsion 1 drop  1 drop Both Eyes BID Clapacs, Madie Reno, MD   1 drop at 10/01/17  1650  . diclofenac sodium (VOLTAREN) 1 % transdermal gel 4 g  4 g Topical QID Pucilowska, Jolanta B, MD   4 g at 10/01/17 1653  . ferrous sulfate tablet 325 mg  325 mg Oral Q breakfast Pucilowska, Jolanta B, MD   325 mg at 10/01/17 1654  . gabapentin (NEURONTIN) capsule 100 mg  100 mg Oral BID Clapacs, Madie Reno, MD   100 mg at 10/01/17 1653  . ketorolac (TORADOL) 30 MG/ML injection           . levothyroxine (SYNTHROID, LEVOTHROID) tablet 100 mcg  100 mcg Oral QAC breakfast Pucilowska, Jolanta B, MD   100 mcg at 09/30/17 0619  . lidocaine (LIDODERM) 5 % 1 patch  1 patch Transdermal Q24H Pucilowska, Jolanta B, MD   1 patch at 10/01/17 1223  . linaclotide (LINZESS) capsule 290 mcg  290 mcg Oral QAC breakfast Pucilowska, Jolanta B, MD   290 mcg at 09/30/17 0619  . LORazepam (ATIVAN) tablet 0.5 mg  0.5 mg Oral Q6H PRN Clapacs, Madie Reno, MD   0.5 mg at 10/01/17 0159  . magnesium hydroxide (MILK OF MAGNESIA) suspension 30 mL  30 mL Oral Daily PRN Clapacs, Madie Reno, MD   30 mL at 09/22/17 0920  . magnesium oxide (MAG-OX) tablet 200 mg  200 mg Oral BID Pucilowska, Jolanta B, MD   200 mg at 10/01/17 1655  . multivitamin with minerals tablet 1 tablet  1 tablet Oral Daily Pucilowska, Jolanta B, MD   1 tablet at 10/01/17 1658  . omega-3 acid ethyl esters (LOVAZA) capsule 1 g   1 g Oral BID Pucilowska, Jolanta B, MD   1 g at 09/30/17 1726  . ondansetron (ZOFRAN-ODT) disintegrating tablet 4 mg  4 mg Oral Q8H PRN Pucilowska, Jolanta B, MD   4 mg at 09/27/17 1605  . pantoprazole (PROTONIX) EC tablet 40 mg  40 mg Oral Daily Pucilowska, Jolanta B, MD   40 mg at 10/01/17 1656  . PARoxetine (PAXIL-CR) 24 hr tablet 25 mg  25 mg Oral Daily Pucilowska, Jolanta B, MD   25 mg at 10/01/17 1656  . polyvinyl alcohol (LIQUIFILM TEARS) 1.4 % ophthalmic solution 1 drop  1 drop Both Eyes PRN Ramond Dial, MD   1 drop at 09/30/17 2205  . prazosin (MINIPRESS) capsule 2 mg  2 mg Oral BID Pucilowska, Jolanta B, MD   2 mg at 10/01/17 1657  . tiZANidine (ZANAFLEX) tablet 2 mg  2 mg Oral Q8H PRN Pucilowska, Jolanta B, MD   2 mg at 09/29/17 2243  . traZODone (DESYREL) tablet 50 mg  50 mg Oral QHS Pucilowska, Jolanta B, MD   50 mg at 09/30/17 2155  . triamcinolone cream (KENALOG) 0.1 %   Topical TID PRN Ramond Dial, MD        Lab Results:  Results for orders placed or performed during the hospital encounter of 09/19/17 (from the past 48 hour(s))  Glucose, capillary     Status: None   Collection Time: 10/01/17  5:36 AM  Result Value Ref Range   Glucose-Capillary 81 70 - 99 mg/dL   Comment 1 Notify RN     Blood Alcohol level:  Lab Results  Component Value Date   ETH <5 09/29/2016   ETH <5 11/91/4782    Metabolic Disorder Labs: Lab Results  Component Value Date   HGBA1C 5.7 (H) 09/20/2017   MPG 116.89 09/20/2017   No results found for: PROLACTIN Lab Results  Component Value Date  CHOL 173 09/20/2017   TRIG 193 (H) 09/20/2017   HDL 50 09/20/2017   CHOLHDL 3.5 09/20/2017   VLDL 39 09/20/2017   LDLCALC 84 09/20/2017   LDLCALC 87 09/04/2017    Physical Findings: AIMS:  , ,  ,  ,    CIWA:    COWS:     Musculoskeletal: Strength & Muscle Tone: within normal limits Gait & Station: unsteady Patient leans: N/A  Psychiatric Specialty Exam: Physical Exam  ROS   Blood pressure (!) 153/105, pulse 69, temperature 98.7 F (37.1 C), resp. rate 18, height 5\' 3"  (1.6 m), weight 59.9 kg, SpO2 100 %.Body mass index is 23.38 kg/m.  General Appearance: Casual  Eye Contact:  Good  Speech:  Clear and Coherent  Volume:  Normal  Mood:  Depressed  Affect:  Congruent  Thought Process:  Goal Directed  Orientation:  Full (Time, Place, and Person)  Thought Content:  Rumination and Tangential  Suicidal Thoughts:  No  Homicidal Thoughts:  No  Memory:  Immediate;   Fair Recent;   Fair Remote;   Fair  Judgement:  Fair  Insight:  Fair  Psychomotor Activity:  Normal  Concentration:  Concentration: Fair  Recall:  [name]:  Fair  Language:  Fair  Akathisia:  No  Handed:  Right  AIMS (if indicated):     Assets:  Desire for Improvement Housing Resilience  ADL's:  Intact  Cognition:  Impaired,  Mild  Sleep:  Number of Hours: 1     Treatment Plan Summary: Daily contact with patient to assess and evaluate symptoms and progress in treatment, Medication management and Plan Patient actually seems to be a little bit better.  I am gratified and remain optimistic that ECT can possibly get her back into a much better frame of mind.  No change to medicine for now.  Continue supportive counseling and follow-up daily.  Encourage patient to attend groups.  Alethia Berthold, MD 10/01/2017, 9:02 PM

## 2017-10-01 NOTE — H&P (Signed)
Angela [name] is an 67 y.o. female.   Chief Complaint: Depression and anxiety HPI: Chronic mood disorder  Past Medical History:  Diagnosis Date  . Allergic rhinitis   . Anemia   . Blurred vision   . Depression   . Diverticulosis   . Endometriosis   . Falls   . GERD (gastroesophageal reflux disease)   . Hernia, hiatal   . Hypothyroidism   . IBS (irritable bowel syndrome)   . Malignant neoplasm of skin   . Migraine   . Neuropathy   . PTSD (post-traumatic stress disorder)   . Thyroid disease     Past Surgical History:  Procedure Laterality Date  . APPENDECTOMY    . CHOLECYSTECTOMY    . CHOLECYSTECTOMY, LAPAROSCOPIC    . ESOPHAGOGASTRODUODENOSCOPY (EGD) WITH PROPOFOL N/A 03/29/2015   Procedure: ESOPHAGOGASTRODUODENOSCOPY (EGD) WITH PROPOFOL;  Surgeon: Josefine Class, MD;  Location: [facility] ENDOSCOPY;  Service: Endoscopy;  Laterality: N/A;  . HERNIA REPAIR    . laproscopy    . TONSILLECTOMY    . uteral suspension      Family History  Problem Relation Age of Onset  . Heart disease Father   . Cancer Mother        lung  . Urolithiasis Neg Hx   . Kidney disease Neg Hx   . Kidney cancer Neg Hx   . Prostate cancer Neg Hx    Social History:  reports that she quit smoking about 40 years ago. She has never used smokeless tobacco. She reports that she does not drink alcohol or use drugs.  Allergies:  Allergies  Allergen Reactions  . Bee Venom Hives  . Darvon [Propoxyphene] Anaphylaxis and Hives    Hives/throat swelling   . Dicyclomine Itching  . Other Hives    Spider bites cause hives  . Oxycodone-Acetaminophen Nausea And Vomiting and Other (See Comments)    hallucinations   . Peanuts [Peanut Oil] Anaphylaxis and Hives  . Penicillins Hives    Has patient had a PCN reaction causing immediate rash, facial/tongue/throat swelling, SOB or lightheadedness with hypotension: Unknown Has patient had a PCN reaction causing severe rash involving mucus membranes or skin  necrosis: Unknown Has patient had a PCN reaction that required hospitalization: Unknown Has patient had a PCN reaction occurring within the last 10 years: Unknown If all of the above answers are "NO", then may proceed with Cephalosporin use.  . Sulfa Antibiotics Anaphylaxis, Hives and Itching  . Sulfacetamide Sodium Anaphylaxis, Hives and Itching  . Ultram [Tramadol] Hives  . Amikacin Nausea And Vomiting  . Aspirin Hives  . Doxycycline Photosensitivity  . Haloperidol Nausea And Vomiting  . Hymenoptera Venom Preparations Hives    Reaction to spider bites  . Imipramine Hives    Reaction to Tofranil  . Keflex [Cephalexin] Hives  . Sulfur Hives  . Adhesive [Tape] Hives and Rash  . Hydroxyzine Anxiety and Itching  . Prednisone Nausea And Vomiting, Anxiety and Other (See Comments)    Crazy mood swings, strange thoughts (pt does tolerate cortisone injections)    Medications Prior to Admission  Medication Sig Dispense Refill  . amLODipine (NORVASC) 5 MG tablet Take 1 tablet (5 mg total) by mouth 2 (two) times daily. 180 tablet 1  . ANORO ELLIPTA 62.5-25 MCG/INH AEPB Inhale 1 puff into the lungs daily. 14 each 0  . calcium carbonate (OS-CAL) 600 MG TABS tablet Take 1 tablet (600 mg total) by mouth 2 (two) times daily with a meal. (Patient taking  differently: Take 600 mg 3 (three) times daily by mouth. ) 60 tablet 0  . cholecalciferol (VITAMIN D) 1000 units tablet Take 4,000 Units by mouth daily.    . citalopram (CELEXA) 20 MG tablet Take 20 mg by mouth daily.  0  . conjugated estrogens (PREMARIN) vaginal cream Place vaginally 3 (three) times a week. Use 1/2 gram 3-4 times weekly. 42.5 g 4  . cycloSPORINE (RESTASIS) 0.05 % ophthalmic emulsion Place 1 drop into both eyes 2 (two) times daily. 60 each 5  . diclofenac sodium (VOLTAREN) 1 % GEL Apply 2 g topically 4 (four) times daily.    . ferrous sulfate (FERROUSUL) 325 (65 FE) MG tablet Take 1 tablet (325 mg total) by mouth daily with breakfast.  90 tablet 1  . gabapentin (NEURONTIN) 100 MG capsule Take 1-2 capsules (100-200 mg total) by mouth 3 (three) times daily. (Patient taking differently: Take 100 mg by mouth 2 (two) times daily. ) 540 capsule 1  . guaiFENesin (MUCINEX) 600 MG 12 hr tablet Take 1 tablet (600 mg total) by mouth 2 (two) times daily as needed for cough. (Patient taking differently: Take 600 mg by mouth daily. ) 30 tablet 0  . hydrocortisone (PROCTOZONE-HC) 2.5 % rectal cream Place 1 application rectally 2 (two) times daily. 30 g 1  . hydrOXYzine (ATARAX/VISTARIL) 10 MG tablet Take 1 tablet by mouth 2 (two) times daily as needed for anxiety.     Marland Kitchen KRILL OIL OMEGA-3 PO Take 350 mg by mouth daily.    Marland Kitchen levalbuterol (XOPENEX) 1.25 MG/3ML nebulizer solution Inhale 3 mLs into the lungs daily.    Marland Kitchen levothyroxine (SYNTHROID, LEVOTHROID) 100 MCG tablet Take 1 tablet (100 mcg total) by mouth daily before breakfast. 90 tablet 1  . linaclotide (LINZESS) 290 MCG CAPS capsule Take 1 capsule (290 mcg total) by mouth daily before breakfast. 90 capsule 1  . magnesium oxide (MAG-OX) 400 MG tablet Take 400 mg by mouth daily with breakfast.    . montelukast (SINGULAIR) 10 MG tablet Take 1 tablet by mouth at bedtime.  11  . omeprazole (PRILOSEC) 20 MG capsule Take 1 capsule by mouth daily.    Vladimir Faster Glycol-Propyl Glycol (SYSTANE OP) Place 1 drop into both eyes daily as needed (dry eyes).    . polyethylene glycol powder (GLYCOLAX/MIRALAX) powder Take 17-34 g by mouth daily. 1700 g 5  . Probiotic Product (PROBIOTIC PO) Take 1 tablet by mouth daily.    . promethazine (PHENERGAN) 25 MG tablet Take 0.5 tablets (12.5 mg total) by mouth every 6 (six) hours as needed for nausea or vomiting. 30 tablet 0  . Pyridoxine HCl (VITAMIN B-6) 500 MG tablet Take 1 tablet by mouth 2 (two) times daily.    . ranitidine (ZANTAC) 150 MG tablet Take 150 mg by mouth daily before supper.    Marland Kitchen tiZANidine (ZANAFLEX) 2 MG tablet Take 0.5-1 tablets (1-2 mg total) by  mouth 2 (two) times daily as needed for muscle spasms. 180 tablet 1  . tolterodine (DETROL LA) 2 MG 24 hr capsule Take 1 capsule (2 mg total) daily by mouth. 30 capsule 11  . traZODone (DESYREL) 50 MG tablet Take 50 mg by mouth at bedtime.      Results for orders placed or performed during the hospital encounter of 09/19/17 (from the past 48 hour(s))  Glucose, capillary     Status: None   Collection Time: 10/01/17  5:36 AM  Result Value Ref Range   Glucose-Capillary 81 70 -  99 mg/dL   Comment 1 Notify RN    No results found.  Review of Systems  Constitutional: Negative.   HENT: Negative.   Eyes: Negative.   Respiratory: Negative.   Cardiovascular: Negative.   Gastrointestinal: Negative.   Musculoskeletal: Negative.   Skin: Negative.   Neurological: Negative.   Psychiatric/Behavioral: Positive for depression, memory loss and suicidal ideas. Negative for hallucinations and substance abuse. The patient is nervous/anxious and has insomnia.     Blood pressure (!) 130/58, pulse (!) 53, temperature (!) 97 F (36.1 C), temperature source Oral, resp. rate 18, height 5\' 3"  (1.6 m), weight 59.9 kg, SpO2 97 %. Physical Exam  Nursing note and vitals reviewed. Constitutional: She appears well-developed and well-nourished.  HENT:  Head: Normocephalic and atraumatic.  Eyes: Pupils are equal, round, and reactive to light. Conjunctivae are normal.  Neck: Normal range of motion.  Cardiovascular: Normal heart sounds.  Respiratory: Effort normal.  GI: Soft.  Musculoskeletal: Normal range of motion.  Neurological: She is alert.  Skin: Skin is warm and dry.  Psychiatric: She has a normal mood and affect. Her behavior is normal. Judgment and thought content normal.     Assessment/Plan Continue ECT inpatient  Alethia Berthold, MD 10/01/2017, 10:33 AM

## 2017-10-01 NOTE — Progress Notes (Signed)
   10/01/17 1330  Clinical Encounter Type  Visited With Patient;Health care provider;Other (Comment)  Visit Type Initial;Spiritual support;Behavioral Health  Referral From Nurse;Social work  Consult/Referral To Chaplain  Spiritual Encounters  Spiritual Needs Prayer;Emotional   [name] received a page to visit Angela [name]. The patient has been visited several times by all of our resident chaplain staff. The patient has a very negative view of her life since childhood. She shared her start of depression as a college student who lost a boy friend. She states that her family hates her and she is all alone. I asked her to tell me one thing positive and she stated that since she received ECT today an it affects her memory that she can't remember anything positive. I informed the patient that I would check back on her later on once she is feel better from ECT. I prayed and departed the patient's room.

## 2017-10-01 NOTE — Transfer of Care (Signed)
Immediate Anesthesia Transfer of Care Note  Patient: Angela [name]  Procedure(s) Performed: ECT TX  Patient Location: PACU  Anesthesia Type:General  Level of Consciousness: sedated  Airway & Oxygen Therapy: Patient Spontanous Breathing and Patient connected to face mask oxygen  Post-op Assessment: Report given to RN and Post -op Vital signs reviewed and stable  Post vital signs: Reviewed and stable  Last Vitals:  Vitals Value Taken Time  BP    Temp    Pulse    Resp    SpO2      Last Pain:  Vitals:   10/01/17 1001  TempSrc:   PainSc: 0-No pain      Patients Stated Pain Goal: 4 (88/33/74 4514)  Complications: No apparent anesthesia complications

## 2017-10-01 NOTE — Progress Notes (Signed)
D: Pt denies SI/HI/AVH, verbally Is able to contract for safety. Pt. Continues to have a negative attitude and reports horrible day today. Pt. Reports anxiety and depression at, "9/10" for both. Pt. Frequently argumentative and irritable around the unit and with staff. Pt. Given PRN medications for reported dry eyes with good effect. Pt. Reports improved chronic physical pain at, "4/10", but reports emotional pain reportedly, "10/10". Pt. Chronic pain being managed with MD orders.    A: Q x 15 minute observation checks were completed for safety. Patient was provided with education, but unsure of education being retained, might need reinforcement.  Patient was given/offered medications per orders. Patient  was encourage to attend groups, participate in unit activities and continue with plan of care. Pt. Chart and plans of care reviewed. Pt. Given support and encouragement.    R: Patient is not complaint with medication and unit procedures. Pt. Refused Voltaren topical solution states, "It has made my skin break out all over damn it!". Pt. Irritable during assessments and storms off frequently. Will notify treatment team of patient medications refusal. Pt. Continues to make many demands of staff and gets infuriated when staff is unable to help her immediately, because of helping another patient. Pt. Continues to attempt to staff split, by asking for things from one staff when already being addressed by another staff member addressing the topic. Pt. Continues to have poor complaince with front wheel walker for safety. Pt. Given extensive front wheel walker safety education.             Precautionary checks every 15 minutes for safety maintained, room free of safety hazards, patient sustains no injury or falls during this shift. Will endorse care to next shift.

## 2017-10-01 NOTE — Anesthesia Postprocedure Evaluation (Signed)
Anesthesia Post Note  Patient: Angela [name]  Procedure(s) Performed: ECT TX  Patient location during evaluation: PACU Anesthesia Type: General Level of consciousness: awake and alert Pain management: pain level controlled Vital Signs Assessment: post-procedure vital signs reviewed and stable Respiratory status: spontaneous breathing, nonlabored ventilation, respiratory function stable and patient connected to nasal cannula oxygen Cardiovascular status: blood pressure returned to baseline and stable Postop Assessment: no apparent nausea or vomiting Anesthetic complications: no     Last Vitals:  Vitals:   10/01/17 1121 10/01/17 1131  BP: 129/85 (!) 145/77  Pulse: 73 65  Resp: 17 17  Temp:    SpO2: 97% 95%    Last Pain:  Vitals:   10/01/17 1131  TempSrc:   PainSc: 0-No pain                 [name]

## 2017-10-01 NOTE — [facility] Group Notes (Signed)
[facility] Group Notes:  (Nursing/MHT/Case Management/Adjunct)  Date:  10/01/2017  Time:  11:17 PM  Type of Therapy:  Group Therapy  Participation Level:  Active  Participation Quality:  Appropriate  Affect:  Appropriate  Cognitive:  Appropriate  Insight:  Appropriate  Engagement in Group:  Engaged  Modes of Intervention:  Discussion  Summary of Progress/Problems:  Kandis Fantasia 10/01/2017, 11:17 PM

## 2017-10-01 NOTE — Procedures (Signed)
ECT SERVICES Physician's Interval Evaluation & Treatment Note  Patient Identification: Angela [name] MRN:  [id number] Date of Evaluation:  10/01/2017 TX #: 2  MADRS:   MMSE:   P.E. Findings:  No change to physical exam  Psychiatric Interval Note:  Continued mood lability  Subjective:  Patient is a 67 y.o. female seen for evaluation for Electroconvulsive Therapy. Complains of having "flashbacks"  Treatment Summary:   [x]   Right Unilateral             []  Bilateral   % Energy : 0.3 ms 70%   Impedance: 1230 ohms  Seizure Energy Index: 6079 V squared  Postictal Suppression Index: 80%  Seizure Concordance Index: 90%  Medications  Pre Shock: Toradol 30 mg Zofran 4 mg Brevital 60 mg succinylcholine 80 mg  Post Shock:    Seizure Duration: 41 seconds EMG 61 seconds EEG   Comments: Follow-up Wednesday and Friday continue treatment plan  Lungs:  [x]   Clear to auscultation               []  Other:   Heart:    [x]   Regular rhythm             []  irregular rhythm    [x]   Previous H&P reviewed, patient examined and there are NO CHANGES                 []   Previous H&P reviewed, patient examined and there are changes noted.   Alethia Berthold, MD 9/16/201910:34 AM

## 2017-10-01 NOTE — Anesthesia Post-op Follow-up Note (Signed)
Anesthesia QCDR form completed.        

## 2017-10-01 NOTE — Progress Notes (Signed)
D: Patient stated slept good last night .Stated appetite is good and energy level  Is normal. Stated concentration is good . Stated Depressive  Denies suicidal  homicidal ideations  .  No auditory hallucinations  No pain concerns . Appropriate ADL'S. Interacting with peers and staff. Patient has a negative out look on  life .  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   A: Encourage patient participation with unit programming . Instruction  Given on  Medication , verbalize understanding. R: Voice no other concerns. Staff continue to monitor

## 2017-10-01 NOTE — Anesthesia Preprocedure Evaluation (Signed)
Anesthesia Evaluation  Patient identified by MRN, date of birth, ID band Patient awake    Reviewed: Allergy & Precautions, H&P , NPO status , Patient's Chart, lab work & pertinent test results  History of Anesthesia Complications Negative for: history of anesthetic complications  Airway Mallampati: III  TM Distance: <3 FB Neck ROM: limited    Dental  (+) Chipped   Pulmonary neg pulmonary ROS, neg shortness of breath, former smoker,           Cardiovascular Exercise Tolerance: Good hypertension, (-) angina(-) Past MI and (-) DOE      Neuro/Psych  Headaches, PSYCHIATRIC DISORDERS Anxiety Depression Bipolar Disorder  Neuromuscular disease CVA, Residual Symptoms negative neurological ROS     GI/Hepatic Neg liver ROS, hiatal hernia, GERD  Medicated and Controlled,  Endo/Other  Hypothyroidism   Renal/GU negative Renal ROS  negative genitourinary   Musculoskeletal   Abdominal   Peds  Hematology negative hematology ROS (+)   Anesthesia Other Findings Past Medical History: No date: Allergic rhinitis No date: Anemia No date: Blurred vision No date: Depression No date: Diverticulosis No date: Endometriosis No date: Falls No date: GERD (gastroesophageal reflux disease) No date: Hernia, hiatal No date: Hypothyroidism No date: IBS (irritable bowel syndrome) No date: Malignant neoplasm of skin No date: Migraine No date: Neuropathy No date: PTSD (post-traumatic stress disorder) No date: Thyroid disease  Past Surgical History: No date: APPENDECTOMY No date: CHOLECYSTECTOMY No date: CHOLECYSTECTOMY, LAPAROSCOPIC 03/29/2015: ESOPHAGOGASTRODUODENOSCOPY (EGD) WITH PROPOFOL; N/A     Comment:  Procedure: ESOPHAGOGASTRODUODENOSCOPY (EGD) WITH               PROPOFOL;  Surgeon: Josefine Class, MD;  Location:               [facility] ENDOSCOPY;  Service: Endoscopy;  Laterality: N/A; No date: HERNIA REPAIR No date:  laproscopy No date: TONSILLECTOMY No date: uteral suspension  BMI    Body Mass Index:  23.74 kg/m      Reproductive/Obstetrics negative OB ROS                             Anesthesia Physical  Anesthesia Plan  ASA: III  Anesthesia Plan: General   Post-op Pain Management:    Induction: Intravenous  PONV Risk Score and Plan: TIVA  Airway Management Planned: Simple Face Mask and Natural Airway  Additional Equipment:   Intra-op Plan:   Post-operative Plan:   Informed Consent: I have reviewed the patients History and Physical, chart, labs and discussed the procedure including the risks, benefits and alternatives for the proposed anesthesia with the patient or authorized representative who has indicated his/her understanding and acceptance.   Dental Advisory Given  Plan Discussed with: Anesthesiologist, CRNA and Surgeon  Anesthesia Plan Comments: (Patient consented for risks of anesthesia including but not limited to:  - adverse reactions to medications - risk of intubation if required - damage to teeth, lips or other oral mucosa - sore throat or hoarseness - Damage to heart, brain, lungs or loss of life  Patient voiced understanding.)        Anesthesia Quick Evaluation

## 2017-10-01 NOTE — Plan of Care (Signed)
Patient has a negative out look on  life .  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    Problem: Education: Goal: Ability to make informed decisions regarding treatment will improve Outcome: Progressing   Problem: Coping: Goal: Coping ability will improve Outcome: Progressing   Problem: Health Behavior/Discharge Planning: Goal: Identification of resources available to assist in meeting health care needs will improve Outcome: Progressing   Problem: Medication: Goal: Compliance with prescribed medication regimen will improve Outcome: Progressing   Problem: Self-Concept: Goal: Ability to disclose and discuss suicidal ideas will improve Outcome: Progressing Goal: Will verbalize positive feelings about self Outcome: Progressing   Problem: Safety: Goal: Ability to remain free from injury will improve Outcome: Progressing

## 2017-10-02 MED ORDER — BOOST / RESOURCE BREEZE PO LIQD CUSTOM
1.0000 | Freq: Three times a day (TID) | ORAL | Status: DC
  Administered 2017-10-02 – 2017-10-28 (×47): 1 via ORAL
  Administered 2017-10-29: 237 mL via ORAL
  Administered 2017-10-30 – 2017-11-05 (×10): 1 via ORAL

## 2017-10-02 MED ORDER — LORAZEPAM 2 MG PO TABS
2.0000 mg | ORAL_TABLET | ORAL | Status: AC
  Administered 2017-10-02: 2 mg via ORAL
  Filled 2017-10-02: qty 1

## 2017-10-02 MED ORDER — LORAZEPAM 2 MG/ML IJ SOLN: 2.0000 mg | INTRAMUSCULAR | Status: AC

## 2017-10-02 NOTE — Progress Notes (Signed)
D:Patient informed Probation officer of  Small redden spots on  her body  approximately 4  Flat  Not  elevated .   Patient also voice of edema at her knees and ankle  A: Md informed  Of  The above concerns  R Voice no other concerns . Staff continue to monitor

## 2017-10-02 NOTE — Plan of Care (Signed)
Patient has a negative out look on  life .  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   Problem: Education: Goal: Ability to make informed decisions regarding treatment will improve Outcome: Progressing   Problem: Coping: Goal: Coping ability will improve Outcome: Progressing   Problem: Health Behavior/Discharge Planning: Goal: Identification of resources available to assist in meeting health care needs will improve Outcome: Progressing   Problem: Medication: Goal: Compliance with prescribed medication regimen will improve Outcome: Progressing   Problem: Self-Concept: Goal: Ability to disclose and discuss suicidal ideas will improve Outcome: Progressing Goal: Will verbalize positive feelings about self Outcome: Progressing   Problem: Safety: Goal: Ability to remain free from injury will improve Outcome: Progressing

## 2017-10-02 NOTE — B[facility] Group Notes (Signed)
10/02/2017 1PM  Type of Therapy/Topic:  Group Therapy:  Feelings about Diagnosis  Participation Level:  Did Not Attend   Description of Group:   This group will allow patients to explore their thoughts and feelings about diagnoses they have received. Patients will be guided to explore their level of understanding and acceptance of these diagnoses. Facilitator will encourage patients to process their thoughts and feelings about the reactions of others to their diagnosis and will guide patients in identifying ways to discuss their diagnosis with significant others in their lives. This group will be process-oriented, with patients participating in exploration of their own experiences, giving and receiving support, and processing challenge from other group members.   Therapeutic Goals: 1. Patient will demonstrate understanding of diagnosis as evidenced by identifying two or more symptoms of the disorder 2. Patient will be able to express two feelings regarding the diagnosis 3. Patient will demonstrate their ability to communicate their needs through discussion and/or role play  Summary of Patient Progress: Patient was encouraged and invited to attend group. Patient did not attend group. Social worker will continue to encourage group participation in the future.        Therapeutic Modalities:   Cognitive Behavioral Therapy Brief Therapy Feelings Identification    Darin Engels, [place] 10/02/2017 2:13 PM

## 2017-10-02 NOTE — Tx Team (Signed)
Interdisciplinary Treatment and Diagnostic Plan Update  10/01/2017 Time of Session: [address] MRN: [id number]  Principal Diagnosis: Bipolar I disorder, most recent episode mixed, severe with psychotic features ([place])  Secondary Diagnoses: Principal Problem:   Bipolar I disorder, most recent episode mixed, severe with psychotic features ([place]) Active Problems:   Hypothyroidism   Essential hypertension   Plantar fasciitis, bilateral   PTSD (post-traumatic stress disorder)   Agitation   Current Medications:  Current Facility-Administered Medications  Medication Dose Route Frequency Provider Last Rate Last Dose  . acetaminophen (TYLENOL) tablet 650 mg  650 mg Oral Q6H PRN Clapacs, Madie Reno, MD   650 mg at 09/30/17 0619  . alum & mag hydroxide-simeth (MAALOX/MYLANTA) 200-200-20 MG/5ML suspension 30 mL  30 mL Oral Q4H PRN Clapacs, John T, MD   30 mL at 09/28/17 1416  . amLODipine (NORVASC) tablet 5 mg  5 mg Oral BID Clapacs, Madie Reno, MD   5 mg at 10/01/17 1652  . calcium carbonate (TUMS - dosed in mg elemental calcium) chewable tablet 500 mg of elemental calcium  500 mg of elemental calcium Oral BID WC Pucilowska, Jolanta B, MD   500 mg of elemental calcium at 10/01/17 1653  . cholecalciferol (VITAMIN D) tablet 1,000 Units  1,000 Units Oral Daily Pucilowska, Jolanta B, MD   1,000 Units at 09/30/17 1202  . cycloSPORINE (RESTASIS) 0.05 % ophthalmic emulsion 1 drop  1 drop Both Eyes BID Clapacs, Madie Reno, MD   1 drop at 10/02/17 0759  . diclofenac sodium (VOLTAREN) 1 % transdermal gel 4 g  4 g Topical QID Pucilowska, Jolanta B, MD   4 g at 10/02/17 1142  . ferrous sulfate tablet 325 mg  325 mg Oral Q breakfast Pucilowska, Jolanta B, MD   325 mg at 10/01/17 1654  . gabapentin (NEURONTIN) capsule 100 mg  100 mg Oral BID Clapacs, Madie Reno, MD   100 mg at 10/01/17 1653  . levothyroxine (SYNTHROID, LEVOTHROID) tablet 100 mcg  100 mcg Oral QAC breakfast Pucilowska, Jolanta B, MD   100 mcg at  10/02/17 0612  . lidocaine (LIDODERM) 5 % 1 patch  1 patch Transdermal Q24H Pucilowska, Jolanta B, MD   1 patch at 10/01/17 1223  . linaclotide (LINZESS) capsule 290 mcg  290 mcg Oral QAC breakfast Pucilowska, Jolanta B, MD   290 mcg at 10/02/17 0612  . LORazepam (ATIVAN) tablet 0.5 mg  0.5 mg Oral Q6H PRN Clapacs, Madie Reno, MD   0.5 mg at 10/01/17 0159  . magnesium hydroxide (MILK OF MAGNESIA) suspension 30 mL  30 mL Oral Daily PRN Clapacs, Madie Reno, MD   30 mL at 09/22/17 0920  . magnesium oxide (MAG-OX) tablet 200 mg  200 mg Oral BID Pucilowska, Jolanta B, MD   200 mg at 10/01/17 1655  . multivitamin with minerals tablet 1 tablet  1 tablet Oral Daily Pucilowska, Jolanta B, MD   1 tablet at 10/01/17 1658  . omega-3 acid ethyl esters (LOVAZA) capsule 1 g  1 g Oral BID Pucilowska, Jolanta B, MD   1 g at 09/30/17 1726  . ondansetron (ZOFRAN-ODT) disintegrating tablet 4 mg  4 mg Oral Q8H PRN Pucilowska, Jolanta B, MD   4 mg at 09/27/17 1605  . pantoprazole (PROTONIX) EC tablet 40 mg  40 mg Oral Daily Pucilowska, Jolanta B, MD   40 mg at 10/02/17 0612  . PARoxetine (PAXIL-CR) 24 hr tablet 25 mg  25 mg Oral Daily Pucilowska, Jolanta B, MD  25 mg at 10/01/17 1656  . polyvinyl alcohol (LIQUIFILM TEARS) 1.4 % ophthalmic solution 1 drop  1 drop Both Eyes PRN Ramond Dial, MD   1 drop at 10/01/17 2230  . prazosin (MINIPRESS) capsule 2 mg  2 mg Oral BID Pucilowska, Jolanta B, MD   2 mg at 10/01/17 1657  . tiZANidine (ZANAFLEX) tablet 2 mg  2 mg Oral Q8H PRN Pucilowska, Jolanta B, MD   2 mg at 10/02/17 0106  . traZODone (DESYREL) tablet 50 mg  50 mg Oral QHS Pucilowska, Jolanta B, MD   50 mg at 10/01/17 2230  . triamcinolone cream (KENALOG) 0.1 %   Topical TID PRN Ramond Dial, MD       PTA Medications: Medications Prior to Admission  Medication Sig Dispense Refill Last Dose  . amLODipine (NORVASC) 5 MG tablet Take 1 tablet (5 mg total) by mouth 2 (two) times daily. 180 tablet 1 09/27/2017  . ANORO  ELLIPTA 62.5-25 MCG/INH AEPB Inhale 1 puff into the lungs daily. 14 each 0 09/27/2017  . calcium carbonate (OS-CAL) 600 MG TABS tablet Take 1 tablet (600 mg total) by mouth 2 (two) times daily with a meal. (Patient taking differently: Take 600 mg 3 (three) times daily by mouth. ) 60 tablet 0 09/27/2017  . cholecalciferol (VITAMIN D) 1000 units tablet Take 4,000 Units by mouth daily.   09/27/2017  . citalopram (CELEXA) 20 MG tablet Take 20 mg by mouth daily.  0 09/27/2017  . conjugated estrogens (PREMARIN) vaginal cream Place vaginally 3 (three) times a week. Use 1/2 gram 3-4 times weekly. 42.5 g 4 09/27/2017  . cycloSPORINE (RESTASIS) 0.05 % ophthalmic emulsion Place 1 drop into both eyes 2 (two) times daily. 60 each 5 09/27/2017  . diclofenac sodium (VOLTAREN) 1 % GEL Apply 2 g topically 4 (four) times daily.   09/27/2017  . ferrous sulfate (FERROUSUL) 325 (65 FE) MG tablet Take 1 tablet (325 mg total) by mouth daily with breakfast. 90 tablet 1 09/27/2017  . gabapentin (NEURONTIN) 100 MG capsule Take 1-2 capsules (100-200 mg total) by mouth 3 (three) times daily. (Patient taking differently: Take 100 mg by mouth 2 (two) times daily. ) 540 capsule 1 09/27/2017  . guaiFENesin (MUCINEX) 600 MG 12 hr tablet Take 1 tablet (600 mg total) by mouth 2 (two) times daily as needed for cough. (Patient taking differently: Take 600 mg by mouth daily. ) 30 tablet 0 09/27/2017  . hydrocortisone (PROCTOZONE-HC) 2.5 % rectal cream Place 1 application rectally 2 (two) times daily. 30 g 1 09/27/2017  . hydrOXYzine (ATARAX/VISTARIL) 10 MG tablet Take 1 tablet by mouth 2 (two) times daily as needed for anxiety.    09/27/2017  . KRILL OIL OMEGA-3 PO Take 350 mg by mouth daily.   09/27/2017  . levalbuterol (XOPENEX) 1.25 MG/3ML nebulizer solution Inhale 3 mLs into the lungs daily.   09/27/2017  . levothyroxine (SYNTHROID, LEVOTHROID) 100 MCG tablet Take 1 tablet (100 mcg total) by mouth daily before breakfast. 90 tablet 1 09/27/2017  .  linaclotide (LINZESS) 290 MCG CAPS capsule Take 1 capsule (290 mcg total) by mouth daily before breakfast. 90 capsule 1 09/27/2017  . magnesium oxide (MAG-OX) 400 MG tablet Take 400 mg by mouth daily with breakfast.   09/27/2017  . montelukast (SINGULAIR) 10 MG tablet Take 1 tablet by mouth at bedtime.  11 09/27/2017  . omeprazole (PRILOSEC) 20 MG capsule Take 1 capsule by mouth daily.   09/27/2017  . Polyethyl Glycol-Propyl Glycol (SYSTANE  OP) Place 1 drop into both eyes daily as needed (dry eyes).   09/27/2017  . polyethylene glycol powder (GLYCOLAX/MIRALAX) powder Take 17-34 g by mouth daily. 1700 g 5 09/27/2017  . Probiotic Product (PROBIOTIC PO) Take 1 tablet by mouth daily.   09/27/2017  . promethazine (PHENERGAN) 25 MG tablet Take 0.5 tablets (12.5 mg total) by mouth every 6 (six) hours as needed for nausea or vomiting. 30 tablet 0 09/27/2017  . Pyridoxine HCl (VITAMIN B-6) 500 MG tablet Take 1 tablet by mouth 2 (two) times daily.   09/27/2017  . ranitidine (ZANTAC) 150 MG tablet Take 150 mg by mouth daily before supper.   09/27/2017  . tiZANidine (ZANAFLEX) 2 MG tablet Take 0.5-1 tablets (1-2 mg total) by mouth 2 (two) times daily as needed for muscle spasms. 180 tablet 1 09/27/2017  . tolterodine (DETROL LA) 2 MG 24 hr capsule Take 1 capsule (2 mg total) daily by mouth. 30 capsule 11 09/27/2017  . traZODone (DESYREL) 50 MG tablet Take 50 mg by mouth at bedtime.   09/27/2017    Patient Stressors: Financial difficulties Health problems Traumatic event Other: "Pain" "fear" "trauma" "homeless"  Patient Strengths: Ability for insight Communication skills General fund of knowledge Motivation for treatment/growth  Treatment Modalities: Medication Management, Group therapy, Case management,  1 to 1 session with clinician, Psychoeducation, Recreational therapy.   Physician Treatment Plan for Primary Diagnosis: Bipolar I disorder, most recent episode mixed, severe with psychotic features ([place]) Long  Term Goal(s): Improvement in symptoms so as ready for discharge Improvement in symptoms so as ready for discharge   Short Term Goals: Ability to identify changes in lifestyle to reduce recurrence of condition will improve Ability to verbalize feelings will improve Ability to disclose and discuss suicidal ideas Ability to demonstrate self-control will improve Ability to identify and develop effective coping behaviors will improve Ability to maintain clinical measurements within normal limits will improve Compliance with prescribed medications will improve Ability to identify triggers associated with substance abuse/mental health issues will improve NA  Medication Management: Evaluate patient's response, side effects, and tolerance of medication regimen.  Therapeutic Interventions: 1 to 1 sessions, Unit Group sessions and Medication administration.  Evaluation of Outcomes: Progressing  Physician Treatment Plan for Secondary Diagnosis: Principal Problem:   Bipolar I disorder, most recent episode mixed, severe with psychotic features ([place]) Active Problems:   Hypothyroidism   Essential hypertension   Plantar fasciitis, bilateral   PTSD (post-traumatic stress disorder)   Agitation  Long Term Goal(s): Improvement in symptoms so as ready for discharge Improvement in symptoms so as ready for discharge   Short Term Goals: Ability to identify changes in lifestyle to reduce recurrence of condition will improve Ability to verbalize feelings will improve Ability to disclose and discuss suicidal ideas Ability to demonstrate self-control will improve Ability to identify and develop effective coping behaviors will improve Ability to maintain clinical measurements within normal limits will improve Compliance with prescribed medications will improve Ability to identify triggers associated with substance abuse/mental health issues will improve NA     Medication Management: Evaluate patient's  response, side effects, and tolerance of medication regimen.  Therapeutic Interventions: 1 to 1 sessions, Unit Group sessions and Medication administration.  Evaluation of Outcomes: Progressing   RN Treatment Plan for Primary Diagnosis: Bipolar I disorder, most recent episode mixed, severe with psychotic features ([place]) Long Term Goal(s): Knowledge of disease and therapeutic regimen to maintain health will improve  Short Term Goals: Ability to identify and develop  effective coping behaviors will improve and Compliance with prescribed medications will improve  Medication Management: RN will administer medications as ordered by provider, will assess and evaluate patient's response and provide education to patient for prescribed medication. RN will report any adverse and/or side effects to prescribing provider.  Therapeutic Interventions: 1 on 1 counseling sessions, Psychoeducation, Medication administration, Evaluate responses to treatment, Monitor vital signs and CBGs as ordered, Perform/monitor CIWA, COWS, AIMS and Fall Risk screenings as ordered, Perform wound care treatments as ordered.  Evaluation of Outcomes: Progressing   LCSW Treatment Plan for Primary Diagnosis: Bipolar I disorder, most recent episode mixed, severe with psychotic features ([place]) Long Term Goal(s): Safe transition to appropriate next level of care at discharge, Engage patient in therapeutic group addressing interpersonal concerns.  Short Term Goals: Engage patient in aftercare planning with referrals and resources, Increase social support and Increase skills for wellness and recovery  Therapeutic Interventions: Assess for all discharge needs, 1 to 1 time with Social worker, Explore available resources and support systems, Assess for adequacy in community support network, Educate family and significant other(s) on suicide prevention, Complete Psychosocial Assessment, Interpersonal group therapy.  Evaluation of Outcomes:  Progressing   Progress in Treatment: Attending groups: No. Participating in groups: No. Taking medication as prescribed: Yes. Toleration medication: Yes. Family/Significant other contact made: No, will contact:  pt declined consent Patient understands diagnosis: Yes. Discussing patient identified problems/goals with staff: Yes. Medical problems stabilized or resolved: Yes. Denies suicidal/homicidal ideation: Yes. Issues/concerns per patient self-inventory: No. Other: none  New problem(s) identified: No, Describe:  none  New Short Term/Long Term Goal(s):  Patient Goals: Currently receiving ECT treatment  Discharge Plan or Barriers: TBD   Reason for Continuation of Hospitalization: Depression, irritability, disorganization, refusing medications at times.  Will begin ECT treatment on Friday   Estimated Length of Stay: 5-7 days.  Attendees: Patient: 10/01/2017   Physician: Dr. Weber Cooks, MD 10/01/2017   Nursing: Polly Cobia RN 10/01/2017  RN Care Manager:   Social Worker:Rozina Pointer, LCSW 10/01/2017  Recreational Therapist: Roanna Epley, LRT 10/01/2017  Other: Darin Engels LCSWA 10/01/2017  Other:   Other:     Scribe for Treatment Team: August Saucer, LCSW 10/02/2017 11:51 AM

## 2017-10-02 NOTE — Progress Notes (Signed)
Recreation Therapy Notes  Date: 10/02/2017  Time: 9:30 pm   Location: Craft Room   Behavioral response: N/A   Intervention Topic: Happiness  Discussion/Intervention: Patient did not attend group.   Clinical Observations/Feedback:  Patient did not attend group.   Angela [name] LRT/CTRS        [name] 10/02/2017 12:27 PM

## 2017-10-02 NOTE — Progress Notes (Signed)
[facility] MD Progress Note  10/02/2017 7:27 PM Angela [name]  MRN:  [id number] Subjective: Follow-up for this patient with bipolar disorder.  Patient seen and chart reviewed.  Spoke with nursing as well.  Patient continues to show labile mood and behavior throughout the day.  Has episodes of agitation and at times even aggression some times where she will almost seem euphoric and then other times where she will seem in despair.  Overall she may be slightly better and more stable than she was when she first came into the hospital.  Patient tells me she is feeling better but then launches into a list of complaints and tells me that she is still having suicidal thoughts and wishes she were dead and is thinking of overdosing on her medicine.  Denies any hallucinations but continues to have disorganized thinking. Principal Problem: Bipolar I disorder, most recent episode mixed, severe with psychotic features ([place]) Diagnosis:   Patient Active Problem List   Diagnosis Date Noted  . Bipolar I disorder, most recent episode mixed, severe with psychotic features ([place]) [F31.64] 09/18/2017  . Agitation [R45.1] 09/18/2017  . IBS (irritable bowel syndrome) [K58.9] 11/17/2016  . Bilateral carpal tunnel syndrome [G56.03] 11/17/2016  . Plantar fasciitis, bilateral [M72.2] 11/17/2016  . Chronic pain syndrome [G89.4] 11/17/2016  . Chronic hyponatremia [E87.1] 11/17/2016  . PTSD (post-traumatic stress disorder) [F43.10] 11/17/2016  . Hernia, hiatal [K44.9] 11/17/2016  . Vitamin D deficiency [E55.9] 11/17/2016  . Vitamin B12 deficiency [E53.8] 11/17/2016  . SIADH (syndrome of inappropriate ADH production) ([place]) [E22.2] 09/08/2016  . Cervical spondylosis with myelopathy [M47.12] 05/24/2016  . Post-concussion headache [G44.309] 05/24/2016  . MCI (mild cognitive impairment) with memory loss [G31.84] 03/30/2016  . Gait abnormality [R26.9] 03/30/2016  . Chronic bipolar affective disorder ([place]) [F31.9] 01/24/2016  .  Anemia, iron deficiency [D50.9] 06/24/2015  . Endometriosis [N80.9] 06/24/2015  . Essential hypertension [I10] 06/24/2015  . History of postmenopausal bleeding [Z87.42] 05/22/2015  . History of TIA (transient ischemic attack) [Z86.73] 04/20/2015  . Hypothyroidism [E03.9] 04/20/2015  . GERD (gastroesophageal reflux disease) [K21.9] 04/20/2015  . Cervical disc disorder with radiculopathy [M50.10] 03/25/2015  . Chronic neck pain [M54.2, G89.29] 03/25/2015  . Pelvic pain in female [R10.2] 10/30/2014  . History of skin cancer [Z85.828] 06/30/2014  . Chronic headaches [R51] 12/16/2013  . Neuropathy [G62.9] 12/16/2013   Total Time spent with patient: 20 minutes  Past Psychiatric History: Past history of long-standing mood disorder multiple hospitalizations multiple medication trials.  Past Medical History:  Past Medical History:  Diagnosis Date  . Allergic rhinitis   . Anemia   . Blurred vision   . Depression   . Diverticulosis   . Endometriosis   . Falls   . GERD (gastroesophageal reflux disease)   . Hernia, hiatal   . Hypothyroidism   . IBS (irritable bowel syndrome)   . Malignant neoplasm of skin   . Migraine   . Neuropathy   . PTSD (post-traumatic stress disorder)   . Thyroid disease     Past Surgical History:  Procedure Laterality Date  . APPENDECTOMY    . CHOLECYSTECTOMY    . CHOLECYSTECTOMY, LAPAROSCOPIC    . ESOPHAGOGASTRODUODENOSCOPY (EGD) WITH PROPOFOL N/A 03/29/2015   Procedure: ESOPHAGOGASTRODUODENOSCOPY (EGD) WITH PROPOFOL;  Surgeon: Josefine Class, MD;  Location[facility] ENDOSCOPY;  Service: Endoscopy;  Laterality: N/A;  . HERNIA REPAIR    . laproscopy    . TONSILLECTOMY    . uteral suspension     Family History:  Family  History  Problem Relation Age of Onset  . Heart disease Father   . Cancer Mother        lung  . Urolithiasis Neg Hx   . Kidney disease Neg Hx   . Kidney cancer Neg Hx   . Prostate cancer Neg Hx    Family Psychiatric  History:  Positive for mood disorder Social History:  Social History   Substance and Sexual Activity  Alcohol Use No     Social History   Substance and Sexual Activity  Drug Use No    Social History   Socioeconomic History  . Marital status: Single    Spouse name: Not on file  . Number of children: 0  . Years of education: 1.5 years of college  . Highest education level: Not on file  Occupational History  . Occupation: Retired  Scientific laboratory technician  . Financial resource strain: Not on file  . Food insecurity:    Worry: Not on file    Inability: Not on file  . Transportation needs:    Medical: Not on file    Non-medical: Not on file  Tobacco Use  . Smoking status: Former Smoker    Last attempt to quit: 11/17/1976    Years since quitting: 40.9  . Smokeless tobacco: Never Used  Substance and Sexual Activity  . Alcohol use: No  . Drug use: No  . Sexual activity: Not Currently  Lifestyle  . Physical activity:    Days per week: Not on file    Minutes per session: Not on file  . Stress: Not on file  Relationships  . Social connections:    Talks on phone: Not on file    Gets together: Not on file    Attends religious service: Not on file    Active member of club or organization: Not on file    Attends meetings of clubs or organizations: Not on file    Relationship status: Not on file  Other Topics Concern  . Not on file  Social History Narrative   Lives at home alone.   Right-handed.   No daily caffeine use.   Additional Social History:    Pain Medications: See PTA Prescriptions: See PTA Over the Counter: See PTA History of alcohol / drug use?: No history of alcohol / drug abuse Longest period of sobriety (when/how long): Reports of none Negative Consequences of Use: (n/a) Withdrawal Symptoms: (n/a)                    Sleep: Fair  Appetite:  Fair  Current Medications: Current Facility-Administered Medications  Medication Dose Route Frequency Provider Last Rate  Last Dose  . acetaminophen (TYLENOL) tablet 650 mg  650 mg Oral Q6H PRN Clapacs, Madie Reno, MD   650 mg at 09/30/17 0619  . alum & mag hydroxide-simeth (MAALOX/MYLANTA) 200-200-20 MG/5ML suspension 30 mL  30 mL Oral Q4H PRN Clapacs, John T, MD   30 mL at 09/28/17 1416  . amLODipine (NORVASC) tablet 5 mg  5 mg Oral BID Clapacs, Madie Reno, MD   5 mg at 10/02/17 1251  . calcium carbonate (TUMS - dosed in mg elemental calcium) chewable tablet 500 mg of elemental calcium  500 mg of elemental calcium Oral BID WC Pucilowska, Jolanta B, MD   500 mg of elemental calcium at 10/02/17 1639  . cholecalciferol (VITAMIN D) tablet 1,000 Units  1,000 Units Oral Daily Pucilowska, Jolanta B, MD   1,000 Units at 10/02/17 1249  .  cycloSPORINE (RESTASIS) 0.05 % ophthalmic emulsion 1 drop  1 drop Both Eyes BID Clapacs, Madie Reno, MD   1 drop at 10/02/17 1639  . diclofenac sodium (VOLTAREN) 1 % transdermal gel 4 g  4 g Topical QID Pucilowska, Jolanta B, MD   4 g at 10/02/17 1641  . ferrous sulfate tablet 325 mg  325 mg Oral Q breakfast Pucilowska, Jolanta B, MD   325 mg at 10/02/17 1253  . gabapentin (NEURONTIN) capsule 100 mg  100 mg Oral BID Clapacs, Madie Reno, MD   100 mg at 10/02/17 1639  . levothyroxine (SYNTHROID, LEVOTHROID) tablet 100 mcg  100 mcg Oral QAC breakfast Pucilowska, Jolanta B, MD   100 mcg at 10/02/17 0612  . lidocaine (LIDODERM) 5 % 1 patch  1 patch Transdermal Q24H Pucilowska, Jolanta B, MD   1 patch at 10/01/17 1223  . linaclotide (LINZESS) capsule 290 mcg  290 mcg Oral QAC breakfast Pucilowska, Jolanta B, MD   290 mcg at 10/02/17 0612  . LORazepam (ATIVAN) tablet 0.5 mg  0.5 mg Oral Q6H PRN Clapacs, Madie Reno, MD   0.5 mg at 10/01/17 0159  . magnesium hydroxide (MILK OF MAGNESIA) suspension 30 mL  30 mL Oral Daily PRN Clapacs, Madie Reno, MD   30 mL at 09/22/17 0920  . magnesium oxide (MAG-OX) tablet 200 mg  200 mg Oral BID Pucilowska, Jolanta B, MD   200 mg at 10/02/17 1639  . multivitamin with minerals tablet 1  tablet  1 tablet Oral Daily Pucilowska, Jolanta B, MD   1 tablet at 10/02/17 1250  . omega-3 acid ethyl esters (LOVAZA) capsule 1 g  1 g Oral BID Pucilowska, Jolanta B, MD   1 g at 10/02/17 1639  . ondansetron (ZOFRAN-ODT) disintegrating tablet 4 mg  4 mg Oral Q8H PRN Pucilowska, Jolanta B, MD   4 mg at 09/27/17 1605  . pantoprazole (PROTONIX) EC tablet 40 mg  40 mg Oral Daily Pucilowska, Jolanta B, MD   40 mg at 10/02/17 0612  . PARoxetine (PAXIL-CR) 24 hr tablet 25 mg  25 mg Oral Daily Pucilowska, Jolanta B, MD   25 mg at 10/02/17 1252  . polyvinyl alcohol (LIQUIFILM TEARS) 1.4 % ophthalmic solution 1 drop  1 drop Both Eyes PRN Ramond Dial, MD   1 drop at 10/01/17 2230  . prazosin (MINIPRESS) capsule 2 mg  2 mg Oral BID Pucilowska, Jolanta B, MD   2 mg at 10/02/17 1639  . tiZANidine (ZANAFLEX) tablet 2 mg  2 mg Oral Q8H PRN Pucilowska, Jolanta B, MD   2 mg at 10/02/17 0106  . traZODone (DESYREL) tablet 50 mg  50 mg Oral QHS Pucilowska, Jolanta B, MD   50 mg at 10/01/17 2230  . triamcinolone cream (KENALOG) 0.1 %   Topical TID PRN Ramond Dial, MD        Lab Results:  Results for orders placed or performed during the hospital encounter of 09/19/17 (from the past 48 hour(s))  Glucose, capillary     Status: None   Collection Time: 10/01/17  5:36 AM  Result Value Ref Range   Glucose-Capillary 81 70 - 99 mg/dL   Comment 1 Notify RN     Blood Alcohol level:  Lab Results  Component Value Date   ETH <5 09/29/2016   ETH <5 75/64/3329    Metabolic Disorder Labs: Lab Results  Component Value Date   HGBA1C 5.7 (H) 09/20/2017   MPG 116.89 09/20/2017   No results found  for: PROLACTIN Lab Results  Component Value Date   CHOL 173 09/20/2017   TRIG 193 (H) 09/20/2017   HDL 50 09/20/2017   CHOLHDL 3.5 09/20/2017   VLDL 39 09/20/2017   LDLCALC 84 09/20/2017   LDLCALC 87 09/04/2017    Physical Findings: AIMS:  , ,  ,  ,    CIWA:    COWS:     Musculoskeletal: Strength &  Muscle Tone: decreased Gait & Station: unsteady Patient leans: N/A  Psychiatric Specialty Exam: Physical Exam  Nursing note and vitals reviewed. Constitutional: She appears well-developed and well-nourished.  HENT:  Head: Normocephalic and atraumatic.  Eyes: Pupils are equal, round, and reactive to light. Conjunctivae are normal.  Neck: Normal range of motion.  Cardiovascular: Regular rhythm and normal heart sounds.  Respiratory: Effort normal. No respiratory distress.  GI: Soft.  Musculoskeletal: Normal range of motion.  Neurological: She is alert.  Skin: Skin is warm and dry.  Psychiatric: Her affect is labile. Her speech is tangential. She is agitated. She is not slowed. She expresses impulsivity and inappropriate judgment. She expresses suicidal ideation. She expresses suicidal plans. She exhibits abnormal recent memory.    Review of Systems  Constitutional: Negative.   HENT: Negative.   Eyes: Negative.   Respiratory: Negative.   Cardiovascular: Negative.   Gastrointestinal: Negative.   Musculoskeletal: Positive for back pain, joint pain and myalgias.  Skin: Negative.   Neurological: Negative.   Psychiatric/Behavioral: Positive for depression and suicidal ideas. Negative for hallucinations, memory loss and substance abuse. The patient is nervous/anxious. The patient does not have insomnia.     Blood pressure (!) 139/94, pulse 63, temperature 98.6 F (37 C), temperature source Oral, resp. rate 16, height 5\' 3"  (1.6 m), weight 59.9 kg, SpO2 98 %.Body mass index is 23.38 kg/m.  General Appearance: Disheveled  Eye Contact:  Fair  Speech:  Pressured  Volume:  Increased  Mood:  Dysphoric and Irritable  Affect:  Labile  Thought Process:  Disorganized  Orientation:  Full (Time, Place, and Person)  Thought Content:  Illogical, Paranoid Ideation, Rumination and Tangential  Suicidal Thoughts:  Yes.  with intent/plan  Homicidal Thoughts:  No  Memory:  Immediate;   Fair Recent;    Fair Remote;   Fair  Judgement:  Impaired  Insight:  Shallow  Psychomotor Activity:  Decreased  Concentration:  Concentration: Poor  Recall:  [name]:  Fair  Language:  Fair  Akathisia:  No  Handed:  Right  AIMS (if indicated):     Assets:  Desire for Improvement Resilience  ADL's:  Impaired  Cognition:  Impaired,  Mild  Sleep:  Number of Hours: 1     Treatment Plan Summary: Daily contact with patient to assess and evaluate symptoms and progress in treatment, Medication management and Plan Difficulty keeping the patient organized enough to even come up with a list of problems that need to be addressed.  She wants to have a dietary consult she wants to have a physical therapy consult.  Continue ECT tomorrow.  Continue current medicine.  Treatment team will work on finding disposition  Alethia Berthold, MD 10/02/2017, 7:27 PM

## 2017-10-02 NOTE — Plan of Care (Addendum)
Patient found in room speaking with MD upon my arrival. Patient is visible and social this evening. Patient spoke with MD at length and then spent most of the evening passing out advice to other patients. Patient is needy and demanding after all peers have gone to bed. Took Trazodone @2230  and remains awake at 0145. Patient feels knees are swollen. Knees do not appear swollen. Patient believes she has been bitten by a tick and may have lime disease. Patient demands that her bed be changed. Patient is endlessly needy and demanding. She has not been rude and is not screaming at staff but she is constantly asking for things. Requests to speak with dietician. Requests that her friend be able to bring her food. Patient is informed that she is not able to have food from outside. Requests to speak with the doctor even though he spent a significant amount of time with her tonight. Requests to speak with SW. Patient is informed that it is 0100 and that it is time for her to sleep and she can see those people tomorrow. The "tick" that the patient gave the nurse was squeezed and no blood was inside of it. The bed was changed and the room inspected. No other bugs were found. Patient then stripped naked and showed RN several "bites." These "bites" looked just like the rash she had 3 nights before and for which an order was obtained for hydrocortisone cream. Patient was provided new scrubs and hydrocortisone cream. Patient was asked to please go to sleep and stop disturbing the other patients. Patient asks questions only to tell the person she is asking that they are wrong. Patient is extremely contrary although she is speaking in a respectful manner. Denies SI/HI/AVH. Eating and voiding adequately. Complains of generalized pain, given Zanaflex. Compliant with HS medication and attends group. Q 15 minute checks maintained. Will continue to monitor throughout the shift. @0345 , patient continues to walk around her room naked.  Patient is told she needs to put on clothing and lie down. Patient then lies down in her bed in front of the window in her door with light on, no clothing. MHT instructs her that she is not allowed to do this. Patient yells at staff that she is not going to put on clothes. She is instructed again to do so. Patient finally follows this directive but remains awake at 0400. Continues to ask staff to come into her room to look at "bites." Patient's skin remains the same as it was when I inspected it last week. She is obsessed regarding her skin. Staff is doing their best to redirect her. Will continue to monitor.  Patient slept only one hour. Would not leave staff alone. Compliant with am medications. Will endorse care to oncoming shift.  Problem: Education: Goal: Ability to make informed decisions regarding treatment will improve Outcome: Progressing   Problem: Coping: Goal: Coping ability will improve Outcome: Progressing   Problem: Health Behavior/Discharge Planning: Goal: Identification of resources available to assist in meeting health care needs will improve Outcome: Progressing   Problem: Medication: Goal: Compliance with prescribed medication regimen will improve Outcome: Progressing   Problem: Self-Concept: Goal: Will verbalize positive feelings about self Outcome: Progressing

## 2017-10-02 NOTE — BHH Group Notes (Signed)
[place] Group Notes:  (Nursing/MHT/Case Management/Adjunct)  Date:  10/02/2017  Time:  10:19 PM  Type of Therapy:  Group Therapy  Participation Level:  Active  Participation Quality:  Monopolizing  Affect:  Appropriate  Cognitive:  Oriented  Insight:  Appropriate  Engagement in Group:  Monopolizing  Modes of Intervention:  Discussion  Summary of Progress/Problems: Angela [name] made a comment about something after each patient shared in group. Angela [name] stated she was up all night and did not feel well all day. Angela [name] stated she wanted to talk to the doctor/nurse about meeting with a dietician. Angela [name] complained of the food on the unit. Angela [name] stated there should be more of a variety of fruit, other than apples. MHT reviewed the rules and expectations of the unit. 1. Visitation hours and number of visitors 2. Phone hours 3. Importance of cleaning after self 4. No food or drinks in room 5. No use of last names or personal information 6. No touching, doing hair, hugging etc. 7. No sharing clothes 8. Informed to keep covered throughout night because of routine checks MHT processed with patients about focusing on solving problems instead of dwelling on problems MHT encouraged patients to change behaviors by changing mindset MHT encouraged medication compliance MHT encouraged patients to talk with prescribing physicians about effectiveness of medications MHT warned not to stop medications when feeling well, informed that meant medications were working MHT encouraged to keep any follow up appointments set by case workers [name] 10/02/2017, 10:19 PM

## 2017-10-02 NOTE — Progress Notes (Signed)
D: Patient stated slept good last night .Stated appetite is good and energy level  Is normal. Stated concentration is good . Stated on Depression scale , hopeless and anxiety .( low 0-10 high) Denies suicidal  homicidal ideations  .  No auditory hallucinations  No pain concerns . Appropriate ADL'S. Interacting with peers and staff. Patient has a negative out look on  life .  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  A: Encourage patient participation with unit programming . Instruction  Given on  Medication , verbalize understanding. R: Voice no other concerns. Staff continue to monitor

## 2017-10-03 ENCOUNTER — Inpatient Hospital Stay: Payer: Medicare Other | Admitting: Anesthesiology

## 2017-10-03 ENCOUNTER — Other Ambulatory Visit: Payer: Self-pay | Admitting: Psychiatry

## 2017-10-03 LAB — GLUCOSE, CAPILLARY: Glucose-Capillary: 92 mg/dL (ref 70–99)

## 2017-10-03 MED ORDER — ALBUTEROL SULFATE (2.5 MG/3ML) 0.083% IN NEBU: 2.5000 mg | INHALATION_SOLUTION | Freq: Four times a day (QID) | RESPIRATORY_TRACT | Status: DC | PRN

## 2017-10-03 MED ORDER — KETOROLAC TROMETHAMINE 30 MG/ML IJ SOLN
30.0000 mg | Freq: Once | INTRAMUSCULAR | Status: AC
  Administered 2017-10-03: 30 mg via INTRAVENOUS

## 2017-10-03 MED ORDER — LOPERAMIDE HCL 2 MG PO CAPS
2.0000 mg | ORAL_CAPSULE | Freq: Once | ORAL | Status: AC
  Administered 2017-10-03: 2 mg via ORAL
  Filled 2017-10-03: qty 1

## 2017-10-03 MED ORDER — METHOHEXITAL SODIUM 100 MG/10ML IV SOSY
PREFILLED_SYRINGE | INTRAVENOUS | Status: DC | PRN
  Administered 2017-10-03: 80 mg via INTRAVENOUS

## 2017-10-03 MED ORDER — SODIUM CHLORIDE 0.9 % IV SOLN
500.0000 mL | Freq: Once | INTRAVENOUS | Status: AC
  Administered 2017-10-03: 500 mL via INTRAVENOUS

## 2017-10-03 MED ORDER — KETOROLAC TROMETHAMINE 30 MG/ML IJ SOLN
INTRAMUSCULAR | Status: AC
  Filled 2017-10-03: qty 1

## 2017-10-03 MED ORDER — SODIUM CHLORIDE 0.9 % IV SOLN
INTRAVENOUS | Status: DC | PRN
  Administered 2017-10-03: 10:00:00 via INTRAVENOUS

## 2017-10-03 MED ORDER — ONDANSETRON HCL 4 MG/2ML IJ SOLN: 4.0000 mg | Freq: Once | INTRAMUSCULAR | Status: DC

## 2017-10-03 MED ORDER — SUCCINYLCHOLINE CHLORIDE 200 MG/10ML IV SOSY
PREFILLED_SYRINGE | INTRAVENOUS | Status: DC | PRN
  Administered 2017-10-03: 80 mg via INTRAVENOUS

## 2017-10-03 MED ORDER — METHOHEXITAL SODIUM 0.5 G IJ SOLR
INTRAMUSCULAR | Status: AC
  Filled 2017-10-03: qty 500

## 2017-10-03 MED ORDER — SUCCINYLCHOLINE CHLORIDE 20 MG/ML IJ SOLN
INTRAMUSCULAR | Status: AC
  Filled 2017-10-03: qty 1

## 2017-10-03 MED ORDER — ONDANSETRON HCL 4 MG/2ML IJ SOLN: 4.0000 mg | Freq: Once | INTRAMUSCULAR | Status: DC | PRN

## 2017-10-03 NOTE — Anesthesia Postprocedure Evaluation (Signed)
Anesthesia Post Note  Patient: Angela [name]  Procedure(s) Performed: ECT TX  Patient location during evaluation: PACU Anesthesia Type: General Level of consciousness: awake and alert Pain management: pain level controlled Vital Signs Assessment: post-procedure vital signs reviewed and stable Respiratory status: spontaneous breathing and respiratory function stable Cardiovascular status: stable Anesthetic complications: no     Last Vitals:  Vitals:   10/03/17 1053 10/03/17 1103  BP: (!) 162/87 138/82  Pulse: 60 63  Resp: 14 16  Temp:  37.1 C  SpO2: 97% 95%    Last Pain:  Vitals:   10/03/17 1103  TempSrc:   PainSc: 0-No pain                 KEPHART,WILLIAM K

## 2017-10-03 NOTE — Plan of Care (Addendum)
Patient found in common area upon my arrival. Patient is visible and social with peers during the evening. Earlier in the evening, patient speaks in a pleasant tone but continues to complain constantly about how she is sick with a fever and sore throat. Patient complains, "I just really don't feel good. I don't think I'm up for ECT tomorrow." VSS. Patient BP is WNL. Afebrile. Patient reports that she has different parameters for fever and BP than other human beings. There is nothing anyone can say that is able to reassure her. Reports not having seen any SW or Treatment team since her arrival. Reports not seeing Chaplain despite requesting to do so. When RN tells her she has seen the SW and that we spoke about it last night, she is argumentative. When told she refused to see the Chaplain who came to see her, patient denies that. Patient constantly denies having seen the doctor and having had medication. Patient followed this RN around the unit talking about how sick she is and how no one is paying attention to her. This RN spent 45 minutes with her attempting to address her health concerns. Patient then moved to the next staff members talking in detail about they should take care of her laundry, provide her snack, and carry her things for her. Patient, again tonight, passed out advice to several of her peers unsolicited. Patient stated that peer is being "hurt by the same person who hurt me." Patient reports being sick but eats constantly stating, "I haven't eaten all day." Patient says this every day and eats excessively at night every night.  @2240 , patient approaches this RN and states again that she believes she is too sick to get ECT. RN educates patient that she does not have a fever and her BP is not elevated. Further, RN attempted to redirect patient in thinking more positively. Patient then became enraged. She screamed in writer's face and went to her room and slammed the door. Patient then stood in her  room for 10 minutes throwing things around her room and cursing loudly. MD contacted. Attempted to allow the patient to calm down. As patient was attempting to avoid ECT, RN instructed MHT to remove food and drink from patient's room. Because patient continued to yell and curse while MHT was in the room, I came to the room and she slammed the door in my face. I reopened the door and told the patient if she did not calm down, she would have to be medicated. Patient then attempted to hit me with the door. I stopped the door and moved her away from the door and told her she could not close the door with staff inside. At this point, 3 MHTs and both security guards and I were standing in the room. Patient grabbed my hands and attempted to dig her fingernails into my skin. I told her to stop and she attempted to bite my face. Dr. Weber Cooks contacted. Ordered Ativan 2mg  STAT. Patient instructed to get into bed and willingly took the medication @ 2325. Patient fell asleep 0015. Q 15 minute checks maintained. Will continue to monitor and enforce NPO status. Patient slept 5.25 hours. No apparent distress. CBG 92. NPO status maintained. Will endorse care to oncoming shift.  Problem: Medication: Goal: Compliance with prescribed medication regimen will improve Outcome: Progressing   Problem: Self-Concept: Goal: Will verbalize positive feelings about self Outcome: Progressing   Problem: Safety: Goal: Ability to remain free from injury will improve Outcome: Progressing  Problem: Education: Goal: Ability to make informed decisions regarding treatment will improve Outcome: Not Progressing   Problem: Coping: Goal: Coping ability will improve Outcome: Not Progressing   Problem: Health Behavior/Discharge Planning: Goal: Identification of resources available to assist in meeting health care needs will improve Outcome: Not Progressing

## 2017-10-03 NOTE — BHH Group Notes (Signed)
[place] Group Notes:  (Nursing/MHT/Case Management/Adjunct)  Date:  10/03/2017  Time:  9:34 PM  Type of Therapy:  Group Therapy  Participation Level:  Did Not Attend   Nehemiah Settle 10/03/2017, 9:34 PM

## 2017-10-03 NOTE — Progress Notes (Signed)
D: Patient  Reports  Feeling bad  Having diarrhea  And chills  A: Paged  MD Clapacs  Ordered Immodium  R: Informed Patient of medication

## 2017-10-03 NOTE — Progress Notes (Signed)
[facility] MD Progress Note  10/03/2017 4:57 PM Angela [name]  MRN:  [id number] Subjective: Follow-up note for this patient with bipolar disorder mood instability chronic behavior problems.  Patient had ECT treatment today.  Treatment was without any complication and she had what should be a affective quality seizure.  Recovered without incident.  Prior to treatment this morning the patient was very agitated with her usual enormous number of somatic complaints and distress.  She slept poorly last night and I was woken up several times last night by nursing having to call me about her bizarre behavior.  This afternoon, after ECT, the patient is neatly dressed and smiling and interacting in what for her is a pretty appropriate manner.  She is still somatic but not as hysterical or demanding about any of it.  This afternoon she is denying suicidal ideation although yesterday she was still loudly and announcing it. Principal Problem: Bipolar I disorder, most recent episode mixed, severe with psychotic features ([place]) Diagnosis:   Patient Active Problem List   Diagnosis Date Noted  . Bipolar I disorder, most recent episode mixed, severe with psychotic features ([place]) [F31.64] 09/18/2017  . Agitation [R45.1] 09/18/2017  . IBS (irritable bowel syndrome) [K58.9] 11/17/2016  . Bilateral carpal tunnel syndrome [G56.03] 11/17/2016  . Plantar fasciitis, bilateral [M72.2] 11/17/2016  . Chronic pain syndrome [G89.4] 11/17/2016  . Chronic hyponatremia [E87.1] 11/17/2016  . PTSD (post-traumatic stress disorder) [F43.10] 11/17/2016  . Hernia, hiatal [K44.9] 11/17/2016  . Vitamin D deficiency [E55.9] 11/17/2016  . Vitamin B12 deficiency [E53.8] 11/17/2016  . SIADH (syndrome of inappropriate ADH production) ([place]) [E22.2] 09/08/2016  . Cervical spondylosis with myelopathy [M47.12] 05/24/2016  . Post-concussion headache [G44.309] 05/24/2016  . MCI (mild cognitive impairment) with memory loss [G31.84] 03/30/2016  .  Gait abnormality [R26.9] 03/30/2016  . Chronic bipolar affective disorder ([place]) [F31.9] 01/24/2016  . Anemia, iron deficiency [D50.9] 06/24/2015  . Endometriosis [N80.9] 06/24/2015  . Essential hypertension [I10] 06/24/2015  . History of postmenopausal bleeding [Z87.42] 05/22/2015  . History of TIA (transient ischemic attack) [Z86.73] 04/20/2015  . Hypothyroidism [E03.9] 04/20/2015  . GERD (gastroesophageal reflux disease) [K21.9] 04/20/2015  . Cervical disc disorder with radiculopathy [M50.10] 03/25/2015  . Chronic neck pain [M54.2, G89.29] 03/25/2015  . Pelvic pain in female [R10.2] 10/30/2014  . History of skin cancer [Z85.828] 06/30/2014  . Chronic headaches [R51] 12/16/2013  . Neuropathy [G62.9] 12/16/2013   Total Time spent with patient: 30 minutes  Past Psychiatric History: Patient has a long history of mood instability behavior problems difficulty maintaining stable relationships or treatment  Past Medical History:  Past Medical History:  Diagnosis Date  . Allergic rhinitis   . Anemia   . Blurred vision   . Depression   . Diverticulosis   . Endometriosis   . Falls   . GERD (gastroesophageal reflux disease)   . Hernia, hiatal   . Hypothyroidism   . IBS (irritable bowel syndrome)   . Malignant neoplasm of skin   . Migraine   . Neuropathy   . PTSD (post-traumatic stress disorder)   . Thyroid disease     Past Surgical History:  Procedure Laterality Date  . APPENDECTOMY    . CHOLECYSTECTOMY    . CHOLECYSTECTOMY, LAPAROSCOPIC    . ESOPHAGOGASTRODUODENOSCOPY (EGD) WITH PROPOFOL N/A 03/29/2015   Procedure: ESOPHAGOGASTRODUODENOSCOPY (EGD) WITH PROPOFOL;  Surgeon: Josefine Class, MD;  Location: [facility] ENDOSCOPY;  Service: Endoscopy;  Laterality: N/A;  . HERNIA REPAIR    . laproscopy    .  TONSILLECTOMY    . uteral suspension     Family History:  Family History  Problem Relation Age of Onset  . Heart disease Father   . Cancer Mother        lung  . Urolithiasis  Neg Hx   . Kidney disease Neg Hx   . Kidney cancer Neg Hx   . Prostate cancer Neg Hx    Family Psychiatric  History: See previous note Social History:  Social History   Substance and Sexual Activity  Alcohol Use No     Social History   Substance and Sexual Activity  Drug Use No    Social History   Socioeconomic History  . Marital status: Single    Spouse name: Not on file  . Number of children: 0  . Years of education: 1.5 years of college  . Highest education level: Not on file  Occupational History  . Occupation: Retired  Scientific laboratory technician  . Financial resource strain: Not on file  . Food insecurity:    Worry: Not on file    Inability: Not on file  . Transportation needs:    Medical: Not on file    Non-medical: Not on file  Tobacco Use  . Smoking status: Former Smoker    Last attempt to quit: 11/17/1976    Years since quitting: 40.9  . Smokeless tobacco: Never Used  Substance and Sexual Activity  . Alcohol use: No  . Drug use: No  . Sexual activity: Not Currently  Lifestyle  . Physical activity:    Days per week: Not on file    Minutes per session: Not on file  . Stress: Not on file  Relationships  . Social connections:    Talks on phone: Not on file    Gets together: Not on file    Attends religious service: Not on file    Active member of club or organization: Not on file    Attends meetings of clubs or organizations: Not on file    Relationship status: Not on file  Other Topics Concern  . Not on file  Social History Narrative   Lives at home alone.   Right-handed.   No daily caffeine use.   Additional Social History:    Pain Medications: See PTA Prescriptions: See PTA Over the Counter: See PTA History of alcohol / drug use?: No history of alcohol / drug abuse Longest period of sobriety (when/how long): Reports of none Negative Consequences of Use: (n/a) Withdrawal Symptoms: (n/a)                    Sleep: Poor  Appetite:   Fair  Current Medications: Current Facility-Administered Medications  Medication Dose Route Frequency Provider Last Rate Last Dose  . acetaminophen (TYLENOL) tablet 650 mg  650 mg Oral Q6H PRN Clapacs, John T, MD   650 mg at 10/03/17 1613  . albuterol (PROVENTIL HFA;VENTOLIN HFA) 108 (90 Base) MCG/ACT inhaler 2 puff  2 puff Inhalation Q6H PRN Clapacs, John T, MD      . alum & mag hydroxide-simeth (MAALOX/MYLANTA) 200-200-20 MG/5ML suspension 30 mL  30 mL Oral Q4H PRN Clapacs, John T, MD   30 mL at 09/28/17 1416  . amLODipine (NORVASC) tablet 5 mg  5 mg Oral BID Clapacs, Madie Reno, MD   5 mg at 10/02/17 1251  . calcium carbonate (TUMS - dosed in mg elemental calcium) chewable tablet 500 mg of elemental calcium  500 mg of elemental calcium Oral  BID WC Pucilowska, Jolanta B, MD   500 mg of elemental calcium at 10/03/17 1616  . cholecalciferol (VITAMIN D) tablet 1,000 Units  1,000 Units Oral Daily Pucilowska, Jolanta B, MD   1,000 Units at 10/03/17 1625  . cycloSPORINE (RESTASIS) 0.05 % ophthalmic emulsion 1 drop  1 drop Both Eyes BID Clapacs, Madie Reno, MD   1 drop at 10/03/17 1618  . diclofenac sodium (VOLTAREN) 1 % transdermal gel 4 g  4 g Topical QID Pucilowska, Jolanta B, MD   4 g at 10/03/17 1613  . feeding supplement (BOOST / RESOURCE BREEZE) liquid 1 Container  1 Container Oral TID BM Clapacs, Madie Reno, MD   1 Container at 10/02/17 1936  . ferrous sulfate tablet 325 mg  325 mg Oral Q breakfast Pucilowska, Jolanta B, MD   325 mg at 10/03/17 1622  . gabapentin (NEURONTIN) capsule 100 mg  100 mg Oral BID Clapacs, Madie Reno, MD   100 mg at 10/03/17 1619  . ketorolac (TORADOL) 30 MG/ML injection           . levothyroxine (SYNTHROID, LEVOTHROID) tablet 100 mcg  100 mcg Oral QAC breakfast Pucilowska, Jolanta B, MD   Stopped at 10/03/17 0554  . lidocaine (LIDODERM) 5 % 1 patch  1 patch Transdermal Q24H Pucilowska, Jolanta B, MD   1 patch at 10/01/17 1223  . linaclotide (LINZESS) capsule 290 mcg  290 mcg Oral QAC  breakfast Pucilowska, Jolanta B, MD   Stopped at 10/03/17 0554  . LORazepam (ATIVAN) tablet 0.5 mg  0.5 mg Oral Q6H PRN Clapacs, Madie Reno, MD   0.5 mg at 10/01/17 0159  . magnesium hydroxide (MILK OF MAGNESIA) suspension 30 mL  30 mL Oral Daily PRN Clapacs, Madie Reno, MD   30 mL at 09/22/17 0920  . magnesium oxide (MAG-OX) tablet 200 mg  200 mg Oral BID Pucilowska, Jolanta B, MD   200 mg at 10/03/17 1620  . multivitamin with minerals tablet 1 tablet  1 tablet Oral Daily Pucilowska, Jolanta B, MD   1 tablet at 10/03/17 1622  . omega-3 acid ethyl esters (LOVAZA) capsule 1 g  1 g Oral BID Pucilowska, Jolanta B, MD   1 g at 10/03/17 1620  . ondansetron (ZOFRAN) injection 4 mg  4 mg Intravenous Once Clapacs, John T, MD      . ondansetron [facility]) injection 4 mg  4 mg Intravenous Once PRN Gunnar Fusi, MD      . ondansetron (ZOFRAN-ODT) disintegrating tablet 4 mg  4 mg Oral Q8H PRN Pucilowska, Jolanta B, MD   4 mg at 09/27/17 1605  . pantoprazole (PROTONIX) EC tablet 40 mg  40 mg Oral Daily Pucilowska, Jolanta B, MD   40 mg at 10/03/17 1623  . PARoxetine (PAXIL-CR) 24 hr tablet 25 mg  25 mg Oral Daily Pucilowska, Jolanta B, MD   25 mg at 10/03/17 1624  . polyvinyl alcohol (LIQUIFILM TEARS) 1.4 % ophthalmic solution 1 drop  1 drop Both Eyes PRN Ramond Dial, MD   1 drop at 10/01/17 2230  . prazosin (MINIPRESS) capsule 2 mg  2 mg Oral BID Pucilowska, Jolanta B, MD   2 mg at 10/03/17 1621  . tiZANidine (ZANAFLEX) tablet 2 mg  2 mg Oral Q8H PRN Pucilowska, Jolanta B, MD   2 mg at 10/02/17 0106  . traZODone (DESYREL) tablet 50 mg  50 mg Oral QHS Pucilowska, Jolanta B, MD   50 mg at 10/02/17 2127  . triamcinolone cream (KENALOG)  0.1 %   Topical TID PRN Ramond Dial, MD        Lab Results:  Results for orders placed or performed during the hospital encounter of 09/19/17 (from the past 48 hour(s))  Glucose, capillary     Status: None   Collection Time: 10/03/17  6:06 AM  Result Value Ref Range    Glucose-Capillary 92 70 - 99 mg/dL   Comment 1 Notify RN     Blood Alcohol level:  Lab Results  Component Value Date   ETH <5 09/29/2016   ETH <5 28/78/6767    Metabolic Disorder Labs: Lab Results  Component Value Date   HGBA1C 5.7 (H) 09/20/2017   MPG 116.89 09/20/2017   No results found for: PROLACTIN Lab Results  Component Value Date   CHOL 173 09/20/2017   TRIG 193 (H) 09/20/2017   HDL 50 09/20/2017   CHOLHDL 3.5 09/20/2017   VLDL 39 09/20/2017   LDLCALC 84 09/20/2017   LDLCALC 87 09/04/2017    Physical Findings: AIMS:  , ,  ,  ,    CIWA:    COWS:     Musculoskeletal: Strength & Muscle Tone: within normal limits Gait & Station: unsteady Patient leans: N/A  Psychiatric Specialty Exam: Physical Exam  Nursing note and vitals reviewed. Constitutional: She appears well-developed and well-nourished.  HENT:  Head: Normocephalic and atraumatic.  Eyes: Pupils are equal, round, and reactive to light. Conjunctivae are normal.  Neck: Normal range of motion.  Cardiovascular: Regular rhythm and normal heart sounds.  Respiratory: Effort normal. No respiratory distress.  GI: Soft.  Musculoskeletal: Normal range of motion.  Neurological: She is alert.  Skin: Skin is warm and dry.  Psychiatric: Her affect is labile. Her speech is tangential. She is agitated. She is not aggressive. Thought content is not paranoid. Cognition and memory are impaired. She expresses impulsivity and inappropriate judgment. She expresses no homicidal and no suicidal ideation.    Review of Systems  Constitutional: Negative.   HENT: Negative.   Eyes: Negative.   Respiratory: Negative.   Cardiovascular: Negative.   Gastrointestinal: Negative.   Musculoskeletal: Positive for back pain and joint pain.  Skin: Negative.   Neurological: Negative.   Psychiatric/Behavioral: Positive for depression and memory loss. Negative for hallucinations, substance abuse and suicidal ideas. The patient is  nervous/anxious and has insomnia.     Blood pressure (!) 129/92, pulse 63, temperature 98.7 F (37.1 C), resp. rate 16, height 5\' 3"  (1.6 m), weight 59.9 kg, SpO2 95 %.Body mass index is 23.38 kg/m.  General Appearance: Fairly Groomed  Eye Contact:  Good  Speech:  Clear and Coherent  Volume:  Normal  Mood:  Anxious and Euthymic  Affect:  Congruent  Thought Process:  Goal Directed  Orientation:  Full (Time, Place, and Person)  Thought Content:  Rumination  Suicidal Thoughts:  No  Homicidal Thoughts:  No  Memory:  Immediate;   Fair Recent;   Fair Remote;   Fair  Judgement:  Fair  Insight:  Shallow  Psychomotor Activity:  Decreased  Concentration:  Concentration: Fair  Recall:  [name]:  Fair  Language:  Fair  Akathisia:  No  Handed:  Right  AIMS (if indicated):     Assets:  Desire for Improvement  ADL's:  Intact  Cognition:  WNL  Sleep:  Number of Hours: 5.25     Treatment Plan Summary: Daily contact with patient to assess and evaluate symptoms and progress in treatment, Medication management  and Plan Patient consistently shows improvement the afternoon after ECT treatments although the benefit so far seems to wear off by the days in between.  I am still very optimistic that we might be able to achieve some lasting benefit.  Patient's proven track record of inability to really manage herself outside the hospital is well-known.  Right now we do not have any disposition in place for her.  I will talk with social work over the next day or so and see what might be possibly available as far as a discharge plan.  Meanwhile once again tried to gently coach the patient off the subject of her unrealistic somatic complaints and encouraged her to think about the future and taking care of herself and going to groups here.  Alethia Berthold, MD 10/03/2017, 4:57 PM

## 2017-10-03 NOTE — H&P (Signed)
Angela [name] is an 67 y.o. female.   Chief Complaint: Patient with chronic mood disorder continues with labile mood depression and intermittent suicidal ideation and agitation HPI: History of long-standing severe mood problems rapid mood swings suicidal behavior  Past Medical History:  Diagnosis Date  . Allergic rhinitis   . Anemia   . Blurred vision   . Depression   . Diverticulosis   . Endometriosis   . Falls   . GERD (gastroesophageal reflux disease)   . Hernia, hiatal   . Hypothyroidism   . IBS (irritable bowel syndrome)   . Malignant neoplasm of skin   . Migraine   . Neuropathy   . PTSD (post-traumatic stress disorder)   . Thyroid disease     Past Surgical History:  Procedure Laterality Date  . APPENDECTOMY    . CHOLECYSTECTOMY    . CHOLECYSTECTOMY, LAPAROSCOPIC    . ESOPHAGOGASTRODUODENOSCOPY (EGD) WITH PROPOFOL N/A 03/29/2015   Procedure: ESOPHAGOGASTRODUODENOSCOPY (EGD) WITH PROPOFOL;  Surgeon: Josefine Class, MD;  Location: [facility] ENDOSCOPY;  Service: Endoscopy;  Laterality: N/A;  . HERNIA REPAIR    . laproscopy    . TONSILLECTOMY    . uteral suspension      Family History  Problem Relation Age of Onset  . Heart disease Father   . Cancer Mother        lung  . Urolithiasis Neg Hx   . Kidney disease Neg Hx   . Kidney cancer Neg Hx   . Prostate cancer Neg Hx    Social History:  reports that she quit smoking about 40 years ago. She has never used smokeless tobacco. She reports that she does not drink alcohol or use drugs.  Allergies:  Allergies  Allergen Reactions  . Bee Venom Hives  . Darvon [Propoxyphene] Anaphylaxis and Hives    Hives/throat swelling   . Dicyclomine Itching  . Other Hives    Spider bites cause hives  . Oxycodone-Acetaminophen Nausea And Vomiting and Other (See Comments)    hallucinations   . Peanuts [Peanut Oil] Anaphylaxis and Hives  . Penicillins Hives    Has patient had a PCN reaction causing immediate rash,  facial/tongue/throat swelling, SOB or lightheadedness with hypotension: Unknown Has patient had a PCN reaction causing severe rash involving mucus membranes or skin necrosis: Unknown Has patient had a PCN reaction that required hospitalization: Unknown Has patient had a PCN reaction occurring within the last 10 years: Unknown If all of the above answers are "NO", then may proceed with Cephalosporin use.  . Sulfa Antibiotics Anaphylaxis, Hives and Itching  . Sulfacetamide Sodium Anaphylaxis, Hives and Itching  . Ultram [Tramadol] Hives  . Amikacin Nausea And Vomiting  . Aspirin Hives  . Doxycycline Photosensitivity  . Haloperidol Nausea And Vomiting  . Hymenoptera Venom Preparations Hives    Reaction to spider bites  . Imipramine Hives    Reaction to Tofranil  . Keflex [Cephalexin] Hives  . Sulfur Hives  . Adhesive [Tape] Hives and Rash  . Hydroxyzine Anxiety and Itching  . Prednisone Nausea And Vomiting, Anxiety and Other (See Comments)    Crazy mood swings, strange thoughts (pt does tolerate cortisone injections)    Medications Prior to Admission  Medication Sig Dispense Refill  . amLODipine (NORVASC) 5 MG tablet Take 1 tablet (5 mg total) by mouth 2 (two) times daily. 180 tablet 1  . ANORO ELLIPTA 62.5-25 MCG/INH AEPB Inhale 1 puff into the lungs daily. 14 each 0  . calcium carbonate (OS-CAL)  600 MG TABS tablet Take 1 tablet (600 mg total) by mouth 2 (two) times daily with a meal. (Patient taking differently: Take 600 mg 3 (three) times daily by mouth. ) 60 tablet 0  . cholecalciferol (VITAMIN D) 1000 units tablet Take 4,000 Units by mouth daily.    . citalopram (CELEXA) 20 MG tablet Take 20 mg by mouth daily.  0  . conjugated estrogens (PREMARIN) vaginal cream Place vaginally 3 (three) times a week. Use 1/2 gram 3-4 times weekly. 42.5 g 4  . cycloSPORINE (RESTASIS) 0.05 % ophthalmic emulsion Place 1 drop into both eyes 2 (two) times daily. 60 each 5  . diclofenac sodium (VOLTAREN)  1 % GEL Apply 2 g topically 4 (four) times daily.    . ferrous sulfate (FERROUSUL) 325 (65 FE) MG tablet Take 1 tablet (325 mg total) by mouth daily with breakfast. 90 tablet 1  . gabapentin (NEURONTIN) 100 MG capsule Take 1-2 capsules (100-200 mg total) by mouth 3 (three) times daily. (Patient taking differently: Take 100 mg by mouth 2 (two) times daily. ) 540 capsule 1  . guaiFENesin (MUCINEX) 600 MG 12 hr tablet Take 1 tablet (600 mg total) by mouth 2 (two) times daily as needed for cough. (Patient taking differently: Take 600 mg by mouth daily. ) 30 tablet 0  . hydrocortisone (PROCTOZONE-HC) 2.5 % rectal cream Place 1 application rectally 2 (two) times daily. 30 g 1  . hydrOXYzine (ATARAX/VISTARIL) 10 MG tablet Take 1 tablet by mouth 2 (two) times daily as needed for anxiety.     Marland Kitchen KRILL OIL OMEGA-3 PO Take 350 mg by mouth daily.    Marland Kitchen levalbuterol (XOPENEX) 1.25 MG/3ML nebulizer solution Inhale 3 mLs into the lungs daily.    Marland Kitchen levothyroxine (SYNTHROID, LEVOTHROID) 100 MCG tablet Take 1 tablet (100 mcg total) by mouth daily before breakfast. 90 tablet 1  . linaclotide (LINZESS) 290 MCG CAPS capsule Take 1 capsule (290 mcg total) by mouth daily before breakfast. 90 capsule 1  . magnesium oxide (MAG-OX) 400 MG tablet Take 400 mg by mouth daily with breakfast.    . montelukast (SINGULAIR) 10 MG tablet Take 1 tablet by mouth at bedtime.  11  . omeprazole (PRILOSEC) 20 MG capsule Take 1 capsule by mouth daily.    Vladimir Faster Glycol-Propyl Glycol (SYSTANE OP) Place 1 drop into both eyes daily as needed (dry eyes).    . polyethylene glycol powder (GLYCOLAX/MIRALAX) powder Take 17-34 g by mouth daily. 1700 g 5  . Probiotic Product (PROBIOTIC PO) Take 1 tablet by mouth daily.    . promethazine (PHENERGAN) 25 MG tablet Take 0.5 tablets (12.5 mg total) by mouth every 6 (six) hours as needed for nausea or vomiting. 30 tablet 0  . Pyridoxine HCl (VITAMIN B-6) 500 MG tablet Take 1 tablet by mouth 2 (two) times  daily.    . ranitidine (ZANTAC) 150 MG tablet Take 150 mg by mouth daily before supper.    Marland Kitchen tiZANidine (ZANAFLEX) 2 MG tablet Take 0.5-1 tablets (1-2 mg total) by mouth 2 (two) times daily as needed for muscle spasms. 180 tablet 1  . tolterodine (DETROL LA) 2 MG 24 hr capsule Take 1 capsule (2 mg total) daily by mouth. 30 capsule 11  . traZODone (DESYREL) 50 MG tablet Take 50 mg by mouth at bedtime.      Results for orders placed or performed during the hospital encounter of 09/19/17 (from the past 48 hour(s))  Glucose, capillary  Status: None   Collection Time: 10/03/17  6:06 AM  Result Value Ref Range   Glucose-Capillary 92 70 - 99 mg/dL   Comment 1 Notify RN    No results found.  Review of Systems  Constitutional: Negative.   HENT: Negative.   Eyes: Negative.   Respiratory: Negative.   Cardiovascular: Negative.   Gastrointestinal: Negative.   Musculoskeletal: Negative.   Skin: Negative.   Neurological: Negative.   Psychiatric/Behavioral: Positive for depression and suicidal ideas. Negative for hallucinations, memory loss and substance abuse. The patient is nervous/anxious and has insomnia.     Blood pressure (!) 153/79, pulse (!) 51, temperature 98 F (36.7 C), temperature source Oral, resp. rate 18, height 5\' 3"  (1.6 m), weight 59.9 kg, SpO2 98 %. Physical Exam  Constitutional: She appears well-developed and well-nourished.  HENT:  Head: Normocephalic and atraumatic.  Eyes: Pupils are equal, round, and reactive to light. Conjunctivae are normal.  Neck: Normal range of motion.  Cardiovascular: Normal heart sounds.  Respiratory: Effort normal.  GI: Soft.  Musculoskeletal: Normal range of motion.  Neurological: She is alert.  Skin: Skin is warm and dry.  Psychiatric: Her mood appears anxious. Her affect is labile. Her speech is tangential. She is agitated. Cognition and memory are impaired. She expresses impulsivity. She exhibits a depressed mood. She expresses  suicidal ideation. She expresses no suicidal plans.     Assessment/Plan Slight improvement but still disorganized agitated mood swings poor coping skills.  Continue with next treatment Friday.  Patient is not currently capable of taking care of her self outside hospital.  Alethia Berthold, MD 10/03/2017, 10:14 AM

## 2017-10-03 NOTE — Plan of Care (Signed)
Patient has a negative out look on  life .  Patient working on Biomedical scientist  but  Continue to get   get upset when confronted  Easily frustrated yells at staff  requesting  staff  Problem: Education: Goal: Ability to make informed decisions regarding treatment will improve Outcome: Progressing   Problem: Coping: Goal: Coping ability will improve Outcome: Progressing   Problem: Health Behavior/Discharge Planning: Goal: Identification of resources available to assist in meeting health care needs will improve Outcome: Progressing   Problem: Medication: Goal: Compliance with prescribed medication regimen will improve Outcome: Progressing   Problem: Self-Concept: Goal: Ability to disclose and discuss suicidal ideas will improve Outcome: Progressing Goal: Will verbalize positive feelings about self Outcome: Progressing   Problem: Safety: Goal: Ability to remain free from injury will improve Outcome: Progressing  to leave her room Encourage  to take   medication at appropriate times   Denies suicidal  ideations  . Working Patent examiner .

## 2017-10-03 NOTE — Anesthesia Procedure Notes (Signed)
Date/Time: 10/03/2017 10:22 AM Performed by: Dionne Bucy, CRNA Pre-anesthesia Checklist: Patient identified, Emergency Drugs available, Suction available and Patient being monitored Patient Re-evaluated:Patient Re-evaluated prior to induction Oxygen Delivery Method: Circle system utilized Preoxygenation: Pre-oxygenation with 100% oxygen Induction Type: IV induction Ventilation: Mask ventilation without difficulty and Mask ventilation throughout procedure Airway Equipment and Method: Bite block Placement Confirmation: positive ETCO2 Dental Injury: Teeth and Oropharynx as per pre-operative assessment

## 2017-10-03 NOTE — [facility] Group Notes (Signed)
LCSW Group Therapy Note  10/03/2017 1:00 pm  Type of Therapy/Topic:  Group Therapy:  Emotion Regulation  Participation Level:  Did Not Attend   Description of Group:    The purpose of this group is to assist patients in learning to regulate negative emotions and experience positive emotions. Patients will be guided to discuss ways in which they have been vulnerable to their negative emotions. These vulnerabilities will be juxtaposed with experiences of positive emotions or situations, and patients will be challenged to use positive emotions to combat negative ones. Special emphasis will be placed on coping with negative emotions in conflict situations, and patients will process healthy conflict resolution skills.  Therapeutic Goals: 1. Patient will identify two positive emotions or experiences to reflect on in order to balance out negative emotions 2. Patient will label two or more emotions that they find the most difficult to experience 3. Patient will demonstrate positive conflict resolution skills through discussion and/or role plays  Summary of Patient Progress:       Therapeutic Modalities:   Cognitive Behavioral Therapy Feelings Identification Dialectical Behavioral Therapy

## 2017-10-03 NOTE — Anesthesia Preprocedure Evaluation (Signed)
Anesthesia Evaluation  Patient identified by MRN, date of birth, ID band Patient awake    Reviewed: Allergy & Precautions, H&P , NPO status , Patient's Chart, lab work & pertinent test results  History of Anesthesia Complications Negative for: history of anesthetic complications  Airway Mallampati: III  TM Distance: <3 FB Neck ROM: limited    Dental  (+) Chipped   Pulmonary neg pulmonary ROS, neg shortness of breath, former smoker,           Cardiovascular Exercise Tolerance: Good hypertension, (-) angina(-) Past MI and (-) DOE      Neuro/Psych  Headaches, PSYCHIATRIC DISORDERS Anxiety Depression Bipolar Disorder  Neuromuscular disease CVA, Residual Symptoms negative neurological ROS     GI/Hepatic Neg liver ROS, hiatal hernia, GERD  Medicated and Controlled,  Endo/Other  Hypothyroidism   Renal/GU negative Renal ROS  negative genitourinary   Musculoskeletal   Abdominal   Peds  Hematology negative hematology ROS (+) anemia ,   Anesthesia Other Findings    Reproductive/Obstetrics negative OB ROS                             Anesthesia Physical  Anesthesia Plan  ASA: III  Anesthesia Plan: General   Post-op Pain Management:    Induction: Intravenous  PONV Risk Score and Plan: TIVA  Airway Management Planned: Simple Face Mask and Natural Airway  Additional Equipment:   Intra-op Plan:   Post-operative Plan:   Informed Consent: I have reviewed the patients History and Physical, chart, labs and discussed the procedure including the risks, benefits and alternatives for the proposed anesthesia with the patient or authorized representative who has indicated his/her understanding and acceptance.   Dental Advisory Given  Plan Discussed with: Anesthesiologist, CRNA and Surgeon  Anesthesia Plan Comments:         Anesthesia Quick Evaluation

## 2017-10-03 NOTE — Anesthesia Post-op Follow-up Note (Signed)
Anesthesia QCDR form completed.        

## 2017-10-03 NOTE — Progress Notes (Signed)
Recreation Therapy Notes  Date: 10/03/2017  Time: 9:30 pm   Location: Craft Room   Behavioral response: N/A   Intervention Topic: Teamwork  Discussion/Intervention: Patient did not attend group.   Clinical Observations/Feedback:  Patient did not attend group.   Severus Brodzinski LRT/CTRS        Easton Fetty 10/03/2017 10:15 AM

## 2017-10-03 NOTE — Progress Notes (Signed)
Escorted to and from ECT without  Any complications . Remained in bed  On return  No medications given . Wanted to sleep.  Patient stated slept good last night .Stated appetite poor   and energy level  Is normal. Stated concentrationpoor . Stated on Depression scale 9 , hopeless 8 and anxiety 10 .( low 0-10 high) Denies suicidal  homicidal ideations  .  No auditory hallucinations  No pain concerns . Appropriate ADL'S. Interacting with peers and staff. Patient has a negative out look on life . Patient working on Chief Financial Officer butContinueto getget upset when confrontedEasily frustratedyells at [employer]  Interacting  better with her peers   A: Encourage patient participation with unit programming . Instruction  Given on  Medication , verbalize understanding.  R: Voice no other concerns. Staff continue to monitor

## 2017-10-03 NOTE — Procedures (Signed)
ECT SERVICES Physician's Interval Evaluation & Treatment Note  Patient Identification: Angela [name] MRN:  [id number] Date of Evaluation:  10/03/2017 TX #: 3  MADRS:   MMSE:   P.E. Findings:  Patient continues to complain of multiple orthopedic complaints also complains that she is feeling "sick" and having trouble breathing although there is no sign of any respiratory distress and she is not coughing.  Vitals unremarkable except for slightly elevated blood pressure.  Psychiatric Interval Note:  Mood continues to be labile dysphoric and anxious having passive suicidal thoughts  Subjective:  Patient is a 67 y.o. female seen for evaluation for Electroconvulsive Therapy. Continues to feel bad and has multiple somatic complaints  Treatment Summary:   [x]   Right Unilateral             []  Bilateral   % Energy : 0.3 ms 70%   Impedance: 760 ohms  Seizure Energy Index: 4675 V squared  Postictal Suppression Index: 84%  Seizure Concordance Index: 94%  Medications  Pre Shock: Toradol 30 mg Zofran 4 mg Brevital 60 mg succinylcholine 80 mg  Post Shock:    Seizure Duration: 35 seconds EMG 51 seconds EEG   Comments: Follow-up Friday  Lungs:  [x]   Clear to auscultation               []  Other:   Heart:    [x]   Regular rhythm             []  irregular rhythm    [x]   Previous H&P reviewed, patient examined and there are NO CHANGES                 []   Previous H&P reviewed, patient examined and there are changes noted.   Alethia Berthold, MD 9/18/201910:16 AM

## 2017-10-03 NOTE — Transfer of Care (Signed)
Immediate Anesthesia Transfer of Care Note  Patient: Angela [name]  Procedure(s) Performed: ECT TX  Patient Location: PACU  Anesthesia Type:General  Level of Consciousness: sedated  Airway & Oxygen Therapy: Patient Spontanous Breathing and Patient connected to face mask oxygen  Post-op Assessment: Report given to RN and Post -op Vital signs reviewed and stable  Post vital signs: Reviewed and stable  Last Vitals:  Vitals Value Taken Time  BP 134/89 10/03/2017 10:33 AM  Temp    Pulse 80 10/03/2017 10:33 AM  Resp 17 10/03/2017 10:33 AM  SpO2 100 % 10/03/2017 10:33 AM  Vitals shown include unvalidated device data.  Last Pain:  Vitals:   10/03/17 0855  TempSrc: Oral  PainSc: 0-No pain      Patients Stated Pain Goal: 4 (84/21/03 1281)  Complications: No apparent anesthesia complications

## 2017-10-04 ENCOUNTER — Other Ambulatory Visit: Payer: Self-pay | Admitting: Psychiatry

## 2017-10-04 MED ORDER — VITAMIN B-6 50 MG PO TABS
100.0000 mg | ORAL_TABLET | Freq: Two times a day (BID) | ORAL | Status: DC
  Administered 2017-10-04 – 2017-11-06 (×58): 100 mg via ORAL
  Filled 2017-10-04 (×68): qty 2

## 2017-10-04 MED ORDER — BLISTEX MEDICATED EX OINT
TOPICAL_OINTMENT | CUTANEOUS | Status: DC | PRN
  Administered 2017-10-04 – 2017-10-05 (×2): via TOPICAL
  Administered 2017-10-30: 1 via TOPICAL
  Administered 2017-10-30 – 2017-10-31 (×2): via TOPICAL
  Administered 2017-10-31: 1 via TOPICAL
  Administered 2017-11-02 – 2017-11-05 (×2): via TOPICAL
  Filled 2017-10-04 (×2): qty 6.3

## 2017-10-04 NOTE — Progress Notes (Signed)
[facility] MD Progress Note  10/04/2017 11:03 PM Angela [name]  MRN:  [id number] Subjective: Follow-up for this patient with bipolar disorder with depression.  Patient seen chart reviewed.  Patient is in the midst of ECT treatment.  This afternoon the patient was awake alert and actually quite appropriate in her interaction with me.  Her hygiene is much improved.  She was able to have a lucid back and forth conversation without becoming completely emotionally overwhelmed.  She was able to make requests without becoming desperate or over emotional.  She was able to think about discharge planning in a more reasonable way rather than getting overwhelmed by black and white thinking.  In short I think the patient has shown significant improvement.  In spite of what some have seen as her intractable personality disorder I think we are seeing a real improvement in her underlying mood disorder from the ECT and medication management.  Patient is now denying any current suicidal thoughts.  Does not seem to be nearly as thought disordered as previously.  She still requests several seemingly trivial somatic things such as a consult with the dietitian and the addition of vitamin B6 tablets but this is not nearly as bad as the flood of complaints that she had as recently as 2 days ago. Principal Problem: Bipolar I disorder, most recent episode mixed, severe with psychotic features ([place]) Diagnosis:   Patient Active Problem List   Diagnosis Date Noted  . Bipolar I disorder, most recent episode mixed, severe with psychotic features ([place]) [F31.64] 09/18/2017  . Agitation [R45.1] 09/18/2017  . IBS (irritable bowel syndrome) [K58.9] 11/17/2016  . Bilateral carpal tunnel syndrome [G56.03] 11/17/2016  . Plantar fasciitis, bilateral [M72.2] 11/17/2016  . Chronic pain syndrome [G89.4] 11/17/2016  . Chronic hyponatremia [E87.1] 11/17/2016  . PTSD (post-traumatic stress disorder) [F43.10] 11/17/2016  . Hernia, hiatal [K44.9]  11/17/2016  . Vitamin D deficiency [E55.9] 11/17/2016  . Vitamin B12 deficiency [E53.8] 11/17/2016  . SIADH (syndrome of inappropriate ADH production) ([place]) [E22.2] 09/08/2016  . Cervical spondylosis with myelopathy [M47.12] 05/24/2016  . Post-concussion headache [G44.309] 05/24/2016  . MCI (mild cognitive impairment) with memory loss [G31.84] 03/30/2016  . Gait abnormality [R26.9] 03/30/2016  . Chronic bipolar affective disorder ([place]) [F31.9] 01/24/2016  . Anemia, iron deficiency [D50.9] 06/24/2015  . Endometriosis [N80.9] 06/24/2015  . Essential hypertension [I10] 06/24/2015  . History of postmenopausal bleeding [Z87.42] 05/22/2015  . History of TIA (transient ischemic attack) [Z86.73] 04/20/2015  . Hypothyroidism [E03.9] 04/20/2015  . GERD (gastroesophageal reflux disease) [K21.9] 04/20/2015  . Cervical disc disorder with radiculopathy [M50.10] 03/25/2015  . Chronic neck pain [M54.2, G89.29] 03/25/2015  . Pelvic pain in female [R10.2] 10/30/2014  . History of skin cancer [Z85.828] 06/30/2014  . Chronic headaches [R51] 12/16/2013  . Neuropathy [G62.9] 12/16/2013   Total Time spent with patient: 30 minutes  Past Psychiatric History: Patient has a history of crippling mood disorder and behavior problems going back probably to adolescence.  See previous notes.  Past Medical History:  Past Medical History:  Diagnosis Date  . Allergic rhinitis   . Anemia   . Blurred vision   . Depression   . Diverticulosis   . Endometriosis   . Falls   . GERD (gastroesophageal reflux disease)   . Hernia, hiatal   . Hypothyroidism   . IBS (irritable bowel syndrome)   . Malignant neoplasm of skin   . Migraine   . Neuropathy   . PTSD (post-traumatic stress disorder)   .  Thyroid disease     Past Surgical History:  Procedure Laterality Date  . APPENDECTOMY    . CHOLECYSTECTOMY    . CHOLECYSTECTOMY, LAPAROSCOPIC    . ESOPHAGOGASTRODUODENOSCOPY (EGD) WITH PROPOFOL N/A 03/29/2015   Procedure:  ESOPHAGOGASTRODUODENOSCOPY (EGD) WITH PROPOFOL;  Surgeon: Josefine Class, MD;  Location: [facility]COPY;  Service: Endoscopy;  Laterality: N/A;  . HERNIA REPAIR    . laproscopy    . TONSILLECTOMY    . uteral suspension     Family History:  Family History  Problem Relation Age of Onset  . Heart disease Father   . Cancer Mother        lung  . Urolithiasis Neg Hx   . Kidney disease Neg Hx   . Kidney cancer Neg Hx   . Prostate cancer Neg Hx    Family Psychiatric  History: See previous notes Social History:  Social History   Substance and Sexual Activity  Alcohol Use No     Social History   Substance and Sexual Activity  Drug Use No    Social History   Socioeconomic History  . Marital status: Single    Spouse name: Not on file  . Number of children: 0  . Years of education: 1.5 years of college  . Highest education level: Not on file  Occupational History  . Occupation: Retired  Scientific laboratory technician  . Financial resource strain: Not on file  . Food insecurity:    Worry: Not on file    Inability: Not on file  . Transportation needs:    Medical: Not on file    Non-medical: Not on file  Tobacco Use  . Smoking status: Former Smoker    Last attempt to quit: 11/17/1976    Years since quitting: 40.9  . Smokeless tobacco: Never Used  Substance and Sexual Activity  . Alcohol use: No  . Drug use: No  . Sexual activity: Not Currently  Lifestyle  . Physical activity:    Days per week: Not on file    Minutes per session: Not on file  . Stress: Not on file  Relationships  . Social connections:    Talks on phone: Not on file    Gets together: Not on file    Attends religious service: Not on file    Active member of club or organization: Not on file    Attends meetings of clubs or organizations: Not on file    Relationship status: Not on file  Other Topics Concern  . Not on file  Social History Narrative   Lives at home alone.   Right-handed.   No daily caffeine use.    Additional Social History:    Pain Medications: See PTA Prescriptions: See PTA Over the Counter: See PTA History of alcohol / drug use?: No history of alcohol / drug abuse Longest period of sobriety (when/how long): Reports of none Negative Consequences of Use: (n/a) Withdrawal Symptoms: (n/a)                    Sleep: Fair  Appetite:  Fair  Current Medications: Current Facility-Administered Medications  Medication Dose Route Frequency Provider Last Rate Last Dose  . acetaminophen (TYLENOL) tablet 650 mg  650 mg Oral Q6H PRN Clapacs, Madie Reno, MD   650 mg at 10/04/17 2159  . albuterol (PROVENTIL) (2.5 MG/3ML) 0.083% nebulizer solution 2.5 mg  2.5 mg Inhalation Q6H PRN Clapacs, Madie Reno, MD      . alum & mag hydroxide-simeth (  MAALOX/MYLANTA) 200-200-20 MG/5ML suspension 30 mL  30 mL Oral Q4H PRN Clapacs, John T, MD   30 mL at 09/28/17 1416  . amLODipine (NORVASC) tablet 5 mg  5 mg Oral BID Clapacs, Madie Reno, MD   5 mg at 10/02/17 1251  . calcium carbonate (TUMS - dosed in mg elemental calcium) chewable tablet 500 mg of elemental calcium  500 mg of elemental calcium Oral BID WC Pucilowska, Jolanta B, MD   500 mg of elemental calcium at 10/04/17 1756  . cholecalciferol (VITAMIN D) tablet 1,000 Units  1,000 Units Oral Daily Pucilowska, Jolanta B, MD   1,000 Units at 10/04/17 1002  . cycloSPORINE (RESTASIS) 0.05 % ophthalmic emulsion 1 drop  1 drop Both Eyes BID Clapacs, Madie Reno, MD   1 drop at 10/04/17 1758  . diclofenac sodium (VOLTAREN) 1 % transdermal gel 4 g  4 g Topical QID Pucilowska, Jolanta B, MD   4 g at 10/04/17 2202  . feeding supplement (BOOST / RESOURCE BREEZE) liquid 1 Container  1 Container Oral TID BM Clapacs, Madie Reno, MD   1 Container at 10/04/17 1400  . ferrous sulfate tablet 325 mg  325 mg Oral Q breakfast Pucilowska, Jolanta B, MD   325 mg at 10/04/17 1001  . gabapentin (NEURONTIN) capsule 100 mg  100 mg Oral BID Clapacs, Madie Reno, MD   100 mg at 10/04/17 1756  .  levothyroxine (SYNTHROID, LEVOTHROID) tablet 100 mcg  100 mcg Oral QAC breakfast Pucilowska, Jolanta B, MD   100 mcg at 10/04/17 0828  . lidocaine (LIDODERM) 5 % 1 patch  1 patch Transdermal Q24H Pucilowska, Jolanta B, MD   1 patch at 10/01/17 1223  . linaclotide (LINZESS) capsule 290 mcg  290 mcg Oral QAC breakfast Pucilowska, Jolanta B, MD   290 mcg at 10/04/17 0828  . lip balm (BLISTEX) ointment   Topical PRN Clapacs, John T, MD      . LORazepam (ATIVAN) tablet 0.5 mg  0.5 mg Oral Q6H PRN Clapacs, Madie Reno, MD   0.5 mg at 10/03/17 2320  . magnesium hydroxide (MILK OF MAGNESIA) suspension 30 mL  30 mL Oral Daily PRN Clapacs, John T, MD   30 mL at 10/04/17 1014  . magnesium oxide (MAG-OX) tablet 200 mg  200 mg Oral BID Pucilowska, Jolanta B, MD   200 mg at 10/04/17 1757  . multivitamin with minerals tablet 1 tablet  1 tablet Oral Daily Pucilowska, Jolanta B, MD   1 tablet at 10/04/17 1002  . omega-3 acid ethyl esters (LOVAZA) capsule 1 g  1 g Oral BID Pucilowska, Jolanta B, MD   1 g at 10/04/17 1756  . ondansetron (ZOFRAN) injection 4 mg  4 mg Intravenous Once Clapacs, John T, MD      . ondansetron [facility]) injection 4 mg  4 mg Intravenous Once PRN Gunnar Fusi, MD      . ondansetron (ZOFRAN-ODT) disintegrating tablet 4 mg  4 mg Oral Q8H PRN Pucilowska, Jolanta B, MD   4 mg at 09/27/17 1605  . pantoprazole (PROTONIX) EC tablet 40 mg  40 mg Oral Daily Pucilowska, Jolanta B, MD   40 mg at 10/04/17 0828  . PARoxetine (PAXIL-CR) 24 hr tablet 25 mg  25 mg Oral Daily Pucilowska, Jolanta B, MD   25 mg at 10/04/17 1003  . polyvinyl alcohol (LIQUIFILM TEARS) 1.4 % ophthalmic solution 1 drop  1 drop Both Eyes PRN Ramond Dial, MD   1 drop at 10/04/17 1004  .  prazosin (MINIPRESS) capsule 2 mg  2 mg Oral BID Pucilowska, Jolanta B, MD   2 mg at 10/04/17 1756  . pyridOXINE (VITAMIN B-6) tablet 100 mg  100 mg Oral BID Clapacs, Madie Reno, MD   100 mg at 10/04/17 2159  . tiZANidine (ZANAFLEX) tablet 2 mg  2  mg Oral Q8H PRN Pucilowska, Jolanta B, MD   2 mg at 10/02/17 0106  . traZODone (DESYREL) tablet 50 mg  50 mg Oral QHS Pucilowska, Jolanta B, MD   50 mg at 10/04/17 2159  . triamcinolone cream (KENALOG) 0.1 %   Topical TID PRN Ramond Dial, MD        Lab Results:  Results for orders placed or performed during the hospital encounter of 09/19/17 (from the past 48 hour(s))  Glucose, capillary     Status: None   Collection Time: 10/03/17  6:06 AM  Result Value Ref Range   Glucose-Capillary 92 70 - 99 mg/dL   Comment 1 Notify RN     Blood Alcohol level:  Lab Results  Component Value Date   ETH <5 09/29/2016   ETH <5 82/42/3536    Metabolic Disorder Labs: Lab Results  Component Value Date   HGBA1C 5.7 (H) 09/20/2017   MPG 116.89 09/20/2017   No results found for: PROLACTIN Lab Results  Component Value Date   CHOL 173 09/20/2017   TRIG 193 (H) 09/20/2017   HDL 50 09/20/2017   CHOLHDL 3.5 09/20/2017   VLDL 39 09/20/2017   LDLCALC 84 09/20/2017   LDLCALC 87 09/04/2017    Physical Findings: AIMS:  , ,  ,  ,    CIWA:    COWS:     Musculoskeletal: Strength & Muscle Tone: within normal limits Gait & Station: normal Patient leans: N/A  Psychiatric Specialty Exam: Physical Exam  Nursing note and vitals reviewed. Constitutional: She appears well-developed and well-nourished.  HENT:  Head: Normocephalic and atraumatic.  Eyes: Pupils are equal, round, and reactive to light. Conjunctivae are normal.  Neck: Normal range of motion.  Cardiovascular: Regular rhythm and normal heart sounds.  Respiratory: Effort normal. No respiratory distress.  GI: Soft.  Musculoskeletal: Normal range of motion.  Neurological: She is alert.  Skin: Skin is warm and dry.  Psychiatric: She has a normal mood and affect. Her speech is normal and behavior is normal. Judgment and thought content normal. She exhibits abnormal recent memory.    Review of Systems  Constitutional: Negative.    HENT: Negative.   Eyes: Negative.   Respiratory: Negative.   Cardiovascular: Negative.   Gastrointestinal: Negative.   Musculoskeletal: Negative.   Skin: Negative.   Neurological: Negative.   Psychiatric/Behavioral: Negative for depression, hallucinations, memory loss, substance abuse and suicidal ideas. The patient is nervous/anxious. The patient does not have insomnia.     Blood pressure 129/81, pulse 62, temperature 98.1 F (36.7 C), temperature source Oral, resp. rate 18, height 5\' 3"  (1.6 m), weight 59.9 kg, SpO2 98 %.Body mass index is 23.38 kg/m.  General Appearance: Fairly Groomed  Eye Contact:  Fair  Speech:  Clear and Coherent  Volume:  Normal  Mood:  Euthymic  Affect:  Congruent  Thought Process:  Coherent  Orientation:  Full (Time, Place, and Person)  Thought Content:  Logical  Suicidal Thoughts:  No  Homicidal Thoughts:  No  Memory:  Immediate;   Fair Recent;   Fair Remote;   Fair  Judgement:  Impaired  Insight:  Fair  Psychomotor Activity:  Normal  Concentration:  Concentration: Fair  Recall:  [name]:  Fair  Language:  Fair  Akathisia:  No  Handed:  Right  AIMS (if indicated):     Assets:  Desire for Improvement Resilience  ADL's:  Intact  Cognition:  WNL  Sleep:  Number of Hours: 2.5     Treatment Plan Summary: Daily contact with patient to assess and evaluate symptoms and progress in treatment, Medication management and Plan As noted above the patient I think has shown significant improvement.  In what I think is a very short sighted decision the insurance reviewer is only authorizing one day at a time.  This is a patient whose mental illness could easily take up and almost on limited amount of hospital stay if it is not treated appropriately.  She is sleeping better than she was days ago and is able as I mentioned to engage in lucid conversation.  Patient will have ECT again tomorrow.  No change in medicine for now.  We will talk with  the treatment team and social worker tomorrow about any possible options for discharge planning.  Patient agrees to plan.  Alethia Berthold, MD 10/04/2017, 11:03 PM

## 2017-10-04 NOTE — Progress Notes (Signed)
Received Angela [name] this AM  before breakfast to received some of her medications. She returned after breakfast to take the other medications.She was irritable because she wants to talk with a  nutritionist and Education officer, museum. She wanted to inform the nutritionist to send her almond butter and the social worker to help her find housing. Then she could not find her ankle braces. Security and [name] helped her search her room with success. On her self Inventory, he rated depression and hopelessness 10/10. Anxiety was rated questionable. The kitchen was called to inquire about soy milk, spinach and almond butter.

## 2017-10-04 NOTE — Progress Notes (Signed)
D: Pt denies SI/HI/AVH, verbally able to contract for safety. Pt. Reports she can remain safe while on the unit. Pt. This evening up frequently with front wheel walker in the day room not socializing just reading or eating snacks. Pt. During interactions with this Probation officer or staff continues to be demanding, aggressive verbally, and dominating in conversation. Pt. During assessments Continues to have a negative attitude and is not able to provide a positive feeling when asked several times to see if we could think of positive goals or feelings. Pt. Reports depression, but will not engage further and give an idea of anxiety level, but appears anxious. Pt. Given PRN medications to comfort anxieties with patient request for medications. Pt. Denies pain, but continues to report many somatic complaints despite denying current pain. Pt. States, "I'm alright physically, just emotionally, my heart is broken". No reported Side effects from ECT. Pt. More complaint with front wheel walker for safety. Pt. Chronic pain being managed with MD orders.     A: Q x 15 minute observation checks were completed for safety. Patient was provided with education, but needs reinforcement.  Patient was given/offered medications per orders. Patient  was encourage to attend groups, participate in unit activities and continue with plan of care. Pt. Chart and plans of care reviewed. Pt. Given support and encouragement.   R: Patient continues to be not complaint with medications, refusing to take medications at scheduled times, but when she wants them only. Pt. Does participate in snacks and is visible in the milieu.             Precautionary checks every 15 minutes for safety maintained, room free of safety hazards, patient sustains no injury or falls during this shift. Will endorse care to next shift.

## 2017-10-04 NOTE — [facility] Group Notes (Signed)
[place] Group Notes:  (Nursing/MHT/Case Management/Adjunct)  Date:  10/04/2017  Time:  10:43 PM  Type of Therapy:  Group Therapy  Participation Level:  Active  Participation Quality:  Appropriate  Affect:  Appropriate  Cognitive:  Appropriate  Insight:  Appropriate  Engagement in Group:  Engaged  Modes of Intervention:  Discussion  Summary of Progress/Problems: Angela [name] stated she had several goals, but was only able to accomplish one. Angela [name] stated she was able to do laundry, which was one of her goals. Angela [name] stated she was not able to talk with her social worker or a Automotive engineer. MHT reviewed the rules and expectations of the unit. 1. Visitation hours (2 visitors at a time, no one under 12 allowed on unit) 2. Phone hours (one long distance call per day) 3. Routine checks throughout night, encouraged to cover appropriately 4. Clean up after self, no food or drinks allowed in room 5. No touching, hugging, doing hair 6. No walking down other hallways, only room hall and main hall, or going into other's patient rooms 7. Vital times and self-inventory sheets to be completed 8. No use of last names or sharing of personal information MHT processed with patients about attending groups and availability of social workers and doctors. MHT encouraged patients to learn new coping skills while attending groups MHT encouraged communication between prescribing doctors and patients MHT encouraged patients to inform prescribing doctors of the effectiveness of the their medications [name] 10/04/2017, 10:43 PM
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
## 2017-10-04 NOTE — [facility] Group Notes (Signed)
LCSW Group Therapy Note  10/04/2017 1:00 pm  Type of Therapy/Topic:  Group Therapy:  Balance in Life  Participation Level:  Active  Description of Group:    This group will address the concept of balance and how it feels and looks when one is unbalanced. Patients will be encouraged to process areas in their lives that are out of balance and identify reasons for remaining unbalanced. Facilitators will guide patients in utilizing problem-solving interventions to address and correct the stressor making their life unbalanced. Understanding and applying boundaries will be explored and addressed for obtaining and maintaining a balanced life. Patients will be encouraged to explore ways to assertively make their unbalanced needs known to significant others in their lives, using other group members and facilitator for support and feedback.  Therapeutic Goals: 1. Patient will identify two or more emotions or situations they have that consume much of in their lives. 2. Patient will identify signs/triggers that life has become out of balance:  3. Patient will identify two ways to set boundaries in order to achieve balance in their lives:  4. Patient will demonstrate ability to communicate their needs through discussion and/or role plays  Summary of Patient Progress:  [place] actively participated in today's group on balance in life.  Hartley shared that anger and frustration have been two emotions that have consumed much of her life over the past 3 years and that she has experienced a lot of grief due to 9 loved ones dying within the last 3 years as well as homelessness.  Tonya shared that she often becomes defensive and "act out" which are signs that her life has become out of balance.  Nicey shared that she tries to use her faith to help her achieve more balance.    Therapeutic Modalities:   Cognitive Behavioral Therapy Solution-Focused Therapy Assertiveness Training  Devona Konig, [place] 10/04/2017  3:36 PM

## 2017-10-04 NOTE — Plan of Care (Signed)
Pt. Denies SI/Hi and is able to contract verbally for safety. Pt. Reports she can remain safe while on the unit. Pt. Continues to be not complaint with medications, refusing to take medications and scheduled times, but when she wants them only. Pt. Continues to have a negative attitude and is not able to provide a positive feeling when asked several times.    Problem: Medication: Goal: Compliance with prescribed medication regimen will improve Outcome: Not Progressing   Problem: Self-Concept: Goal: Will verbalize positive feelings about self Outcome: Not Progressing   Problem: Self-Concept: Goal: Ability to disclose and discuss suicidal ideas will improve Outcome: Progressing   Problem: Safety: Goal: Ability to remain free from injury will improve Outcome: Progressing

## 2017-10-05 ENCOUNTER — Inpatient Hospital Stay: Payer: Medicare Other | Admitting: Certified Registered Nurse Anesthetist

## 2017-10-05 ENCOUNTER — Inpatient Hospital Stay: Payer: Medicare Other

## 2017-10-05 ENCOUNTER — Encounter: Payer: Self-pay | Admitting: *Deleted

## 2017-10-05 LAB — GLUCOSE, CAPILLARY: Glucose-Capillary: 96 mg/dL (ref 70–99)

## 2017-10-05 MED ORDER — SODIUM CHLORIDE 0.9 % IV SOLN
500.0000 mL | Freq: Once | INTRAVENOUS | Status: AC
  Administered 2017-10-05: 500 mL via INTRAVENOUS

## 2017-10-05 MED ORDER — HYDROCORTISONE 1 % EX CREA
TOPICAL_CREAM | Freq: Two times a day (BID) | CUTANEOUS | Status: DC
  Administered 2017-10-05 – 2017-10-07 (×4): via TOPICAL
  Administered 2017-10-13: 1 via TOPICAL
  Administered 2017-10-14 (×2): via TOPICAL
  Administered 2017-10-15 – 2017-10-19 (×5): 1 via TOPICAL
  Administered 2017-10-19 – 2017-11-05 (×9): via TOPICAL
  Filled 2017-10-05 (×2): qty 28

## 2017-10-05 MED ORDER — KETOROLAC TROMETHAMINE 30 MG/ML IJ SOLN
INTRAMUSCULAR | Status: AC
  Filled 2017-10-05: qty 1

## 2017-10-05 MED ORDER — KETOROLAC TROMETHAMINE 30 MG/ML IJ SOLN
30.0000 mg | Freq: Once | INTRAMUSCULAR | Status: AC
  Administered 2017-10-05: 30 mg via INTRAVENOUS

## 2017-10-05 MED ORDER — METHOHEXITAL SODIUM 100 MG/10ML IV SOSY
PREFILLED_SYRINGE | INTRAVENOUS | Status: DC | PRN
  Administered 2017-10-05: 80 mg via INTRAVENOUS

## 2017-10-05 MED ORDER — SUCCINYLCHOLINE CHLORIDE 200 MG/10ML IV SOSY
PREFILLED_SYRINGE | INTRAVENOUS | Status: DC | PRN
  Administered 2017-10-05: 80 mg via INTRAVENOUS

## 2017-10-05 MED ORDER — ONDANSETRON HCL 4 MG/2ML IJ SOLN: 4.0000 mg | Freq: Once | INTRAMUSCULAR | Status: DC | PRN

## 2017-10-05 MED ORDER — MIDAZOLAM HCL 2 MG/2ML IJ SOLN: 2.0000 mg | Freq: Once | INTRAMUSCULAR | Status: DC

## 2017-10-05 MED ORDER — FENTANYL CITRATE (PF) 100 MCG/2ML IJ SOLN: 25.0000 ug | INTRAMUSCULAR | Status: DC | PRN

## 2017-10-05 MED ORDER — GLYCOPYRROLATE 0.2 MG/ML IJ SOLN: 0.1000 mg | Freq: Once | INTRAMUSCULAR | Status: DC

## 2017-10-05 NOTE — [facility] Group Notes (Signed)
10/05/2017 1PM  Type of Therapy and Topic:  Group Therapy:  Feelings around Relapse and Recovery  Participation Level:  Active   Description of Group:    Patients in this group will discuss emotions they experience before and after a relapse. They will process how experiencing these feelings, or avoidance of experiencing them, relates to having a relapse. Facilitator will guide patients to explore emotions they have related to recovery. Patients will be encouraged to process which emotions are more powerful. They will be guided to discuss the emotional reaction significant others in their lives may have to patients' relapse or recovery. Patients will be assisted in exploring ways to respond to the emotions of others without this contributing to a relapse.  Therapeutic Goals: 1. Patient will identify two or more emotions that lead to a relapse for them 2. Patient will identify two emotions that result when they relapse 3. Patient will identify two emotions related to recovery 4. Patient will demonstrate ability to communicate their needs through discussion and/or role plays   Summary of Patient Progress: Actively and appropriately engaged in the group. Patient was able to provide support and validation to other group members.Patient practiced active listening when interacting with the facilitator and other group members. Angela [name] spoke about having some grief issues. She reports losing her father and some close friends of her within a close time frame. 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 Patient is still in the process of obtaining treatment goals.      Therapeutic Modalities:   Cognitive Behavioral Therapy Solution-Focused Therapy Assertiveness Training Relapse Prevention Therapy   Darin Engels, [place] 10/05/2017 3:27 PM

## 2017-10-05 NOTE — Progress Notes (Signed)
[facility] MD Progress Note  10/05/2017 5:43 PM Angela [name]  MRN:  [id number] Subjective: Follow-up for this patient with chronic mood instability.  Patient had ECT this morning which was completed without any complication.  I have seen her several times this afternoon and for the most part she is functioning well.  Certainly much better than she was a few days ago.  When engaged in one-on-one conversation she continues to deteriorate into repeating herself over and over about sad things that have happened in her life.  She is not however focused on suicidal ideation currently.  She is keeping her somatic complaints tamped down a little bit. Principal Problem: Bipolar I disorder, most recent episode mixed, severe with psychotic features ([place]) Diagnosis:   Patient Active Problem List   Diagnosis Date Noted  . Bipolar I disorder, most recent episode mixed, severe with psychotic features ([place]) [F31.64] 09/18/2017  . Agitation [R45.1] 09/18/2017  . IBS (irritable bowel syndrome) [K58.9] 11/17/2016  . Bilateral carpal tunnel syndrome [G56.03] 11/17/2016  . Plantar fasciitis, bilateral [M72.2] 11/17/2016  . Chronic pain syndrome [G89.4] 11/17/2016  . Chronic hyponatremia [E87.1] 11/17/2016  . PTSD (post-traumatic stress disorder) [F43.10] 11/17/2016  . Hernia, hiatal [K44.9] 11/17/2016  . Vitamin D deficiency [E55.9] 11/17/2016  . Vitamin B12 deficiency [E53.8] 11/17/2016  . SIADH (syndrome of inappropriate ADH production) ([place]) [E22.2] 09/08/2016  . Cervical spondylosis with myelopathy [M47.12] 05/24/2016  . Post-concussion headache [G44.309] 05/24/2016  . MCI (mild cognitive impairment) with memory loss [G31.84] 03/30/2016  . Gait abnormality [R26.9] 03/30/2016  . Chronic bipolar affective disorder ([place]) [F31.9] 01/24/2016  . Anemia, iron deficiency [D50.9] 06/24/2015  . Endometriosis [N80.9] 06/24/2015  . Essential hypertension [I10] 06/24/2015  . History of postmenopausal bleeding  [Z87.42] 05/22/2015  . History of TIA (transient ischemic attack) [Z86.73] 04/20/2015  . Hypothyroidism [E03.9] 04/20/2015  . GERD (gastroesophageal reflux disease) [K21.9] 04/20/2015  . Cervical disc disorder with radiculopathy [M50.10] 03/25/2015  . Chronic neck pain [M54.2, G89.29] 03/25/2015  . Pelvic pain in female [R10.2] 10/30/2014  . History of skin cancer [Z85.828] 06/30/2014  . Chronic headaches [R51] 12/16/2013  . Neuropathy [G62.9] 12/16/2013   Total Time spent with patient: 20 minutes  Past Psychiatric History: Long history of chronic bipolar disorder and personality disorder and poor function  Past Medical History:  Past Medical History:  Diagnosis Date  . Allergic rhinitis   . Anemia   . Blurred vision   . Depression   . Diverticulosis   . Endometriosis   . Falls   . GERD (gastroesophageal reflux disease)   . Hernia, hiatal   . Hypothyroidism   . IBS (irritable bowel syndrome)   . Malignant neoplasm of skin   . Migraine   . Neuropathy   . PTSD (post-traumatic stress disorder)   . Thyroid disease     Past Surgical History:  Procedure Laterality Date  . APPENDECTOMY    . CHOLECYSTECTOMY    . CHOLECYSTECTOMY, LAPAROSCOPIC    . ESOPHAGOGASTRODUODENOSCOPY (EGD) WITH PROPOFOL N/A 03/29/2015   Procedure: ESOPHAGOGASTRODUODENOSCOPY (EGD) WITH PROPOFOL;  Surgeon: Josefine Class, MD;  Location: [facility];  Service: Endoscopy;  Laterality: N/A;  . HERNIA REPAIR    . laproscopy    . TONSILLECTOMY    . uteral suspension     Family History:  Family History  Problem Relation Age of Onset  . Heart disease Father   . Cancer Mother        lung  . Urolithiasis Neg Hx   .  Kidney disease Neg Hx   . Kidney cancer Neg Hx   . Prostate cancer Neg Hx    Family Psychiatric  History: See previous note Social History:  Social History   Substance and Sexual Activity  Alcohol Use No     Social History   Substance and Sexual Activity  Drug Use No     Social History   Socioeconomic History  . Marital status: Single    Spouse name: Not on file  . Number of children: 0  . Years of education: 1.5 years of college  . Highest education level: Not on file  Occupational History  . Occupation: Retired  Scientific laboratory technician  . Financial resource strain: Not on file  . Food insecurity:    Worry: Not on file    Inability: Not on file  . Transportation needs:    Medical: Not on file    Non-medical: Not on file  Tobacco Use  . Smoking status: Former Smoker    Last attempt to quit: 11/17/1976    Years since quitting: 40.9  . Smokeless tobacco: Never Used  Substance and Sexual Activity  . Alcohol use: No  . Drug use: No  . Sexual activity: Not Currently  Lifestyle  . Physical activity:    Days per week: Not on file    Minutes per session: Not on file  . Stress: Not on file  Relationships  . Social connections:    Talks on phone: Not on file    Gets together: Not on file    Attends religious service: Not on file    Active member of club or organization: Not on file    Attends meetings of clubs or organizations: Not on file    Relationship status: Not on file  Other Topics Concern  . Not on file  Social History Narrative   Lives at home alone.   Right-handed.   No daily caffeine use.   Additional Social History:    Pain Medications: See PTA Prescriptions: See PTA Over the Counter: See PTA History of alcohol / drug use?: No history of alcohol / drug abuse Longest period of sobriety (when/how long): Reports of none Negative Consequences of Use: (n/a) Withdrawal Symptoms: (n/a)                    Sleep: Fair  Appetite:  Fair  Current Medications: Current Facility-Administered Medications  Medication Dose Route Frequency Provider Last Rate Last Dose  . acetaminophen (TYLENOL) tablet 650 mg  650 mg Oral Q6H PRN Clapacs, Madie Reno, MD   650 mg at 10/04/17 2159  . albuterol (PROVENTIL) (2.5 MG/3ML) 0.083% nebulizer solution  2.5 mg  2.5 mg Inhalation Q6H PRN Clapacs, John T, MD      . alum & mag hydroxide-simeth (MAALOX/MYLANTA) 200-200-20 MG/5ML suspension 30 mL  30 mL Oral Q4H PRN Clapacs, John T, MD   30 mL at 09/28/17 1416  . amLODipine (NORVASC) tablet 5 mg  5 mg Oral BID Clapacs, Madie Reno, MD   5 mg at 10/05/17 1408  . calcium carbonate (TUMS - dosed in mg elemental calcium) chewable tablet 500 mg of elemental calcium  500 mg of elemental calcium Oral BID WC Pucilowska, Jolanta B, MD   500 mg of elemental calcium at 10/05/17 1351  . cholecalciferol (VITAMIN D) tablet 1,000 Units  1,000 Units Oral Daily Pucilowska, Jolanta B, MD   1,000 Units at 10/05/17 1356  . cycloSPORINE (RESTASIS) 0.05 % ophthalmic emulsion 1 drop  1 drop Both Eyes BID Clapacs, Madie Reno, MD   1 drop at 10/05/17 1349  . diclofenac sodium (VOLTAREN) 1 % transdermal gel 4 g  4 g Topical QID Pucilowska, Jolanta B, MD   4 g at 10/05/17 1349  . feeding supplement (BOOST / RESOURCE BREEZE) liquid 1 Container  1 Container Oral TID BM Clapacs, Madie Reno, MD   1 Container at 10/04/17 1400  . fentaNYL (SUBLIMAZE) injection 25 mcg  25 mcg Intravenous Q5 min PRN Gunnar Bulla, MD      . ferrous sulfate tablet 325 mg  325 mg Oral Q breakfast Pucilowska, Jolanta B, MD   325 mg at 10/05/17 1353  . gabapentin (NEURONTIN) capsule 100 mg  100 mg Oral BID Clapacs, Madie Reno, MD   100 mg at 10/05/17 1352  . glycopyrrolate (ROBINUL) injection 0.1 mg  0.1 mg Intravenous Once Clapacs, John T, MD      . levothyroxine (SYNTHROID, LEVOTHROID) tablet 100 mcg  100 mcg Oral QAC breakfast Pucilowska, Jolanta B, MD   100 mcg at 10/05/17 1354  . lidocaine (LIDODERM) 5 % 1 patch  1 patch Transdermal Q24H Pucilowska, Jolanta B, MD   1 patch at 10/01/17 1223  . linaclotide (LINZESS) capsule 290 mcg  290 mcg Oral QAC breakfast Pucilowska, Jolanta B, MD   290 mcg at 10/04/17 0828  . lip balm (BLISTEX) ointment   Topical PRN Clapacs, John T, MD      . LORazepam (ATIVAN) tablet 0.5 mg  0.5 mg  Oral Q6H PRN Clapacs, Madie Reno, MD   0.5 mg at 10/03/17 2320  . magnesium hydroxide (MILK OF MAGNESIA) suspension 30 mL  30 mL Oral Daily PRN Clapacs, Madie Reno, MD   30 mL at 10/05/17 1731  . magnesium oxide (MAG-OX) tablet 200 mg  200 mg Oral BID Pucilowska, Jolanta B, MD   200 mg at 10/05/17 1358  . midazolam (VERSED) injection 2 mg  2 mg Intravenous Once Clapacs, John T, MD      . multivitamin with minerals tablet 1 tablet  1 tablet Oral Daily Pucilowska, Jolanta B, MD   1 tablet at 10/05/17 1356  . omega-3 acid ethyl esters (LOVAZA) capsule 1 g  1 g Oral BID Pucilowska, Jolanta B, MD   1 g at 10/05/17 1352  . ondansetron (ZOFRAN) injection 4 mg  4 mg Intravenous Once Clapacs, John T, MD      . ondansetron [facility]) injection 4 mg  4 mg Intravenous Once PRN Gunnar Fusi, MD      . ondansetron [facility]) injection 4 mg  4 mg Intravenous Once PRN Gunnar Bulla, MD      . ondansetron (ZOFRAN-ODT) disintegrating tablet 4 mg  4 mg Oral Q8H PRN Pucilowska, Jolanta B, MD   4 mg at 09/27/17 1605  . pantoprazole (PROTONIX) EC tablet 40 mg  40 mg Oral Daily Pucilowska, Jolanta B, MD   40 mg at 10/05/17 1352  . PARoxetine (PAXIL-CR) 24 hr tablet 25 mg  25 mg Oral Daily Pucilowska, Jolanta B, MD   25 mg at 10/05/17 1355  . polyvinyl alcohol (LIQUIFILM TEARS) 1.4 % ophthalmic solution 1 drop  1 drop Both Eyes PRN Ramond Dial, MD   1 drop at 10/05/17 0630  . prazosin (MINIPRESS) capsule 2 mg  2 mg Oral BID Pucilowska, Jolanta B, MD   2 mg at 10/05/17 1407  . pyridOXINE (VITAMIN B-6) tablet 100 mg  100 mg Oral BID Clapacs, Madie Reno, MD  100 mg at 10/05/17 1354  . tiZANidine (ZANAFLEX) tablet 2 mg  2 mg Oral Q8H PRN Pucilowska, Jolanta B, MD   2 mg at 10/02/17 0106  . traZODone (DESYREL) tablet 50 mg  50 mg Oral QHS Pucilowska, Jolanta B, MD   50 mg at 10/04/17 2159  . triamcinolone cream (KENALOG) 0.1 %   Topical TID PRN Ramond Dial, MD        Lab Results:  Results for orders placed or  performed during the hospital encounter of 09/19/17 (from the past 48 hour(s))  Glucose, capillary     Status: None   Collection Time: 10/05/17  6:29 AM  Result Value Ref Range   Glucose-Capillary 96 70 - 99 mg/dL    Blood Alcohol level:  Lab Results  Component Value Date   ETH <5 09/29/2016   ETH <5 11/12/2534    Metabolic Disorder Labs: Lab Results  Component Value Date   HGBA1C 5.7 (H) 09/20/2017   MPG 116.89 09/20/2017   No results found for: PROLACTIN Lab Results  Component Value Date   CHOL 173 09/20/2017   TRIG 193 (H) 09/20/2017   HDL 50 09/20/2017   CHOLHDL 3.5 09/20/2017   VLDL 39 09/20/2017   LDLCALC 84 09/20/2017   LDLCALC 87 09/04/2017    Physical Findings: AIMS:  , ,  ,  ,    CIWA:    COWS:     Musculoskeletal: Strength & Muscle Tone: within normal limits Gait & Station: normal Patient leans: N/A  Psychiatric Specialty Exam: Physical Exam  Nursing note and vitals reviewed. Constitutional: She appears well-developed and well-nourished.  HENT:  Head: Normocephalic and atraumatic.  Eyes: Pupils are equal, round, and reactive to light. Conjunctivae are normal.  Neck: Normal range of motion.  Cardiovascular: Regular rhythm and normal heart sounds.  Respiratory: Effort normal. No respiratory distress.  GI: Soft.  Musculoskeletal: Normal range of motion.  Neurological: She is alert.  Skin: Skin is warm and dry.  Psychiatric: Her affect is not blunt. She is agitated. She is not aggressive. Thought content is not paranoid. She expresses impulsivity. She exhibits a depressed mood. She expresses no homicidal and no suicidal ideation. She exhibits abnormal recent memory.    Review of Systems  Constitutional: Negative.   HENT: Negative.   Eyes: Negative.   Respiratory: Negative.   Cardiovascular: Negative.   Gastrointestinal: Negative.   Musculoskeletal: Negative.   Skin: Negative.   Neurological: Negative.   Psychiatric/Behavioral: Positive for  depression and memory loss. Negative for hallucinations, substance abuse and suicidal ideas. The patient is nervous/anxious and has insomnia.     Blood pressure (!) 151/73, pulse (!) 55, temperature 98.3 F (36.8 C), temperature source Oral, resp. rate 18, height '5\' 3"'$  (1.6 m), weight 60.8 kg, SpO2 100 %.Body mass index is 23.74 kg/m.  General Appearance: Casual  Eye Contact:  Fair  Speech:  Clear and Coherent  Volume:  Decreased  Mood:  Dysphoric  Affect:  Congruent  Thought Process:  Goal Directed  Orientation:  Full (Time, Place, and Person)  Thought Content:  Rumination and Tangential  Suicidal Thoughts:  No  Homicidal Thoughts:  No  Memory:  Immediate;   Fair Recent;   Poor Remote;   Fair  Judgement:  Impaired  Insight:  Shallow  Psychomotor Activity:  Decreased  Concentration:  Concentration: Poor  Recall:  [name]:  Fair  Language:  Fair  Akathisia:  No  Handed:  Right  AIMS (if  indicated):     Assets:  Desire for Improvement  ADL's:  Intact  Cognition:  Impaired,  Mild  Sleep:  Number of Hours: 6.5     Treatment Plan Summary: Daily contact with patient to assess and evaluate symptoms and progress in treatment, Medication management and Plan ECT without difficulty.  Slept a little better last night.  As usual when he stopped to talk with her one-on-one she has a large number of complaints but left to her own devices she is less disorganized and more appropriate.  Treatment team met today and discussed discharge planning.  It will be difficult finding appropriate disposition for her so we will have to start looking for places.  Asked the patient to please try and be involved with that.  For now continue current medication.  Alethia Berthold, MD 10/05/2017, 5:43 PM

## 2017-10-05 NOTE — Progress Notes (Signed)
Nutrition Brief Note   Met with pt in room today. Pt unhappy about the food she has been receiving in the hospital; pt reports that she is used to eating a "healthier diet" at home. Pt reports that she is lactose intolerant and allergic to peanuts. Pt enjoys liver, almond butter, soy and almond milk, spinach, fresh fruits, wheat bread, roast beef, cheese crackers, ice cream, greek yogurt, and raisons. Pt also enjoys [employer]. RD will add a care order for healthy snacks that patient has agreed on. Will continue [employer]. RD will also add pt's dislikes and likes into [facility] so pt will only get foods she likes. RD also made patient services manager aware of pt's requests.    If further issues arise, or if pt would like to change her snacks, please contact RD  Koleen Distance MS, RD, [place] Pager #[phone] Office#- [phone] After Hours Pager: [phone]

## 2017-10-05 NOTE — Transfer of Care (Signed)
Immediate Anesthesia Transfer of Care Note  Patient: Angela [name]  Procedure(s) Performed: ECT TX  Patient Location: PACU  Anesthesia Type:General  Level of Consciousness: drowsy  Airway & Oxygen Therapy: Patient Spontanous Breathing and Patient connected to face mask oxygen  Post-op Assessment: Report given to RN and Post -op Vital signs reviewed and stable  Post vital signs: Reviewed and stable  Last Vitals:  Vitals Value Taken Time  BP    Temp    Pulse 81 10/05/2017 11:29 AM  Resp 17 10/05/2017 11:29 AM  SpO2 99 % 10/05/2017 11:29 AM  Vitals shown include unvalidated device data.  Last Pain:  Vitals:   10/05/17 0959  TempSrc:   PainSc: 0-No pain      Patients Stated Pain Goal: 4 (44/45/84 8350)  Complications: No apparent anesthesia complications

## 2017-10-05 NOTE — Plan of Care (Addendum)
Patient found in common area upon my arrival. Patient is visible and social throughout the evening. Patient had informed prior shift RN that she would not be speaking to Probation officer. Patient spoke to me once during medication administration. Verbalized understanding of NPO after midnight. Complains of left ear pain, given Tylenol with positive results. Mentions again that she thinks she is too sick for ECT. Writer let her talk through her myriad of somatic complaints without comment. Denies SI/HI/AVH. Compliant with HS medications and staff direction. Attends group. Ate large snack. Drinking plenty of fluids. Q 15 minute checks maintained. Will continue to monitor throughout the shift. Patient slept 6.5 hours. No apparent distress. Maintained NPO status. CBG 96. Requests and is given Restasis eye gtts early. Will endorse care to oncoming shift.   Problem: Coping: Goal: Coping ability will improve Outcome: Progressing   Problem: Medication: Goal: Compliance with prescribed medication regimen will improve Outcome: Progressing   Problem: Safety: Goal: Ability to remain free from injury will improve Outcome: Progressing

## 2017-10-05 NOTE — Anesthesia Preprocedure Evaluation (Signed)
Anesthesia Evaluation  Patient identified by MRN, date of birth, ID band Patient awake    Reviewed: Allergy & Precautions, NPO status , Patient's Chart, lab work & pertinent test results, reviewed documented beta blocker date and time   Airway Mallampati: II  TM Distance: >3 FB     Dental  (+) Chipped   Pulmonary former smoker,           Cardiovascular hypertension,      Neuro/Psych  Headaches, PSYCHIATRIC DISORDERS Anxiety Depression Bipolar Disorder TIA Neuromuscular disease    GI/Hepatic hiatal hernia, GERD  Controlled,  Endo/Other  Hypothyroidism   Renal/GU      Musculoskeletal   Abdominal   Peds  Hematology  (+) anemia ,   Anesthesia Other Findings IADH. Low sats 95%.  Reproductive/Obstetrics                             Anesthesia Physical Anesthesia Plan  ASA: III  Anesthesia Plan: General   Post-op Pain Management:    Induction: Intravenous  PONV Risk Score and Plan:   Airway Management Planned:   Additional Equipment:   Intra-op Plan:   Post-operative Plan:   Informed Consent: I have reviewed the patients History and Physical, chart, labs and discussed the procedure including the risks, benefits and alternatives for the proposed anesthesia with the patient or authorized representative who has indicated his/her understanding and acceptance.     Plan Discussed with: CRNA  Anesthesia Plan Comments:         Anesthesia Quick Evaluation

## 2017-10-05 NOTE — Anesthesia Procedure Notes (Signed)
Performed by: Demetrius Charity, CRNA Pre-anesthesia Checklist: Patient identified, Emergency Drugs available, Suction available, Patient being monitored and Timeout performed Patient Re-evaluated:Patient Re-evaluated prior to induction Oxygen Delivery Method: Circle system utilized Preoxygenation: Pre-oxygenation with 100% oxygen Ventilation: Mask ventilation without difficulty Airway Equipment and Method: Bite block Dental Injury: Teeth and Oropharynx as per pre-operative assessment

## 2017-10-05 NOTE — Progress Notes (Signed)
Recreation Therapy Notes  Date: 10/05/2017  Time: 9:30 pm   Location: Craft Room   Behavioral response: N/A   Intervention Topic: Coping skills  Discussion/Intervention: Patient did not attend group.   Clinical Observations/Feedback:  Patient did not attend group.   Sybil Shrader LRT/CTRS        Khalik Pewitt 10/05/2017 11:21 AM

## 2017-10-05 NOTE — Procedures (Signed)
ECT SERVICES Physician's Interval Evaluation & Treatment Note  Patient Identification: Angela [name] MRN:  [id number] Date of Evaluation:  10/05/2017 TX #: 4  MADRS: 24 MMSE: 30 P.E. Findings:  Physical exam unremarkable.  Vital signs still stable no new findings.  Psychiatric Interval Note:  Mood significantly more stable and calm  Subjective:  Patient is a 67 y.o. female seen for evaluation for Electroconvulsive Therapy. Admits that she is feeling a little bit better  Treatment Summary:   [x]   Right Unilateral             []  Bilateral   % Energy : 0.3 ms 70%   Impedance: 1980 ohms  Seizure Energy Index: 4203 V squared  Postictal Suppression Index: 89%  Seizure Concordance Index: 94%  Medications  Pre Shock: Toradol 30 mg Zofran 4 mg Brevital 60 mg succinylcholine 80 mg  Post Shock:    Seizure Duration: EMG 26 seconds EEG 47 seconds   Comments: Patient really does seem to be improving she is not yet ready to function outside the hospital and we do not have disposition yet we will continue treatment on Monday  Lungs:  [x]   Clear to auscultation               []  Other:   Heart:    [x]   Regular rhythm             []  irregular rhythm    [x]   Previous H&P reviewed, patient examined and there are NO CHANGES                 []   Previous H&P reviewed, patient examined and there are changes noted.   Alethia Berthold, MD 9/20/201911:14 AM

## 2017-10-05 NOTE — H&P (Signed)
Angela [name] is an 67 y.o. female.   Chief Complaint: still depressed  HPI: severe bipolar disordr  Past Medical History:  Diagnosis Date  . Allergic rhinitis   . Anemia   . Blurred vision   . Depression   . Diverticulosis   . Endometriosis   . Falls   . GERD (gastroesophageal reflux disease)   . Hernia, hiatal   . Hypothyroidism   . IBS (irritable bowel syndrome)   . Malignant neoplasm of skin   . Migraine   . Neuropathy   . PTSD (post-traumatic stress disorder)   . Thyroid disease     Past Surgical History:  Procedure Laterality Date  . APPENDECTOMY    . CHOLECYSTECTOMY    . CHOLECYSTECTOMY, LAPAROSCOPIC    . ESOPHAGOGASTRODUODENOSCOPY (EGD) WITH PROPOFOL N/A 03/29/2015   Procedure: ESOPHAGOGASTRODUODENOSCOPY (EGD) WITH PROPOFOL;  Surgeon: Josefine Class, MD;  Location: [facility] ENDOSCOPY;  Service: Endoscopy;  Laterality: N/A;  . HERNIA REPAIR    . laproscopy    . TONSILLECTOMY    . uteral suspension      Family History  Problem Relation Age of Onset  . Heart disease Father   . Cancer Mother        lung  . Urolithiasis Neg Hx   . Kidney disease Neg Hx   . Kidney cancer Neg Hx   . Prostate cancer Neg Hx    Social History:  reports that she quit smoking about 40 years ago. She has never used smokeless tobacco. She reports that she does not drink alcohol or use drugs.  Allergies:  Allergies  Allergen Reactions  . Bee Venom Hives  . Darvon [Propoxyphene] Anaphylaxis and Hives    Hives/throat swelling   . Dicyclomine Itching  . Other Hives    Spider bites cause hives  . Oxycodone-Acetaminophen Nausea And Vomiting and Other (See Comments)    hallucinations   . Peanuts [Peanut Oil] Anaphylaxis and Hives  . Penicillins Hives    Has patient had a PCN reaction causing immediate rash, facial/tongue/throat swelling, SOB or lightheadedness with hypotension: Unknown Has patient had a PCN reaction causing severe rash involving mucus membranes or skin  necrosis: Unknown Has patient had a PCN reaction that required hospitalization: Unknown Has patient had a PCN reaction occurring within the last 10 years: Unknown If all of the above answers are "NO", then may proceed with Cephalosporin use.  . Sulfa Antibiotics Anaphylaxis, Hives and Itching  . Sulfacetamide Sodium Anaphylaxis, Hives and Itching  . Ultram [Tramadol] Hives  . Amikacin Nausea And Vomiting  . Aspirin Hives  . Doxycycline Photosensitivity  . Haloperidol Nausea And Vomiting  . Hymenoptera Venom Preparations Hives    Reaction to spider bites  . Imipramine Hives    Reaction to Tofranil  . Keflex [Cephalexin] Hives  . Sulfur Hives  . Adhesive [Tape] Hives and Rash  . Hydroxyzine Anxiety and Itching  . Prednisone Nausea And Vomiting, Anxiety and Other (See Comments)    Crazy mood swings, strange thoughts (pt does tolerate cortisone injections)    Medications Prior to Admission  Medication Sig Dispense Refill  . amLODipine (NORVASC) 5 MG tablet Take 1 tablet (5 mg total) by mouth 2 (two) times daily. 180 tablet 1  . ANORO ELLIPTA 62.5-25 MCG/INH AEPB Inhale 1 puff into the lungs daily. 14 each 0  . calcium carbonate (OS-CAL) 600 MG TABS tablet Take 1 tablet (600 mg total) by mouth 2 (two) times daily with a meal. (Patient taking  differently: Take 600 mg 3 (three) times daily by mouth. ) 60 tablet 0  . cholecalciferol (VITAMIN D) 1000 units tablet Take 4,000 Units by mouth daily.    . citalopram (CELEXA) 20 MG tablet Take 20 mg by mouth daily.  0  . conjugated estrogens (PREMARIN) vaginal cream Place vaginally 3 (three) times a week. Use 1/2 gram 3-4 times weekly. 42.5 g 4  . cycloSPORINE (RESTASIS) 0.05 % ophthalmic emulsion Place 1 drop into both eyes 2 (two) times daily. 60 each 5  . diclofenac sodium (VOLTAREN) 1 % GEL Apply 2 g topically 4 (four) times daily.    . ferrous sulfate (FERROUSUL) 325 (65 FE) MG tablet Take 1 tablet (325 mg total) by mouth daily with breakfast.  90 tablet 1  . gabapentin (NEURONTIN) 100 MG capsule Take 1-2 capsules (100-200 mg total) by mouth 3 (three) times daily. (Patient taking differently: Take 100 mg by mouth 2 (two) times daily. ) 540 capsule 1  . guaiFENesin (MUCINEX) 600 MG 12 hr tablet Take 1 tablet (600 mg total) by mouth 2 (two) times daily as needed for cough. (Patient taking differently: Take 600 mg by mouth daily. ) 30 tablet 0  . hydrocortisone (PROCTOZONE-HC) 2.5 % rectal cream Place 1 application rectally 2 (two) times daily. 30 g 1  . hydrOXYzine (ATARAX/VISTARIL) 10 MG tablet Take 1 tablet by mouth 2 (two) times daily as needed for anxiety.     Marland Kitchen KRILL OIL OMEGA-3 PO Take 350 mg by mouth daily.    Marland Kitchen levalbuterol (XOPENEX) 1.25 MG/3ML nebulizer solution Inhale 3 mLs into the lungs daily.    Marland Kitchen levothyroxine (SYNTHROID, LEVOTHROID) 100 MCG tablet Take 1 tablet (100 mcg total) by mouth daily before breakfast. 90 tablet 1  . linaclotide (LINZESS) 290 MCG CAPS capsule Take 1 capsule (290 mcg total) by mouth daily before breakfast. 90 capsule 1  . magnesium oxide (MAG-OX) 400 MG tablet Take 400 mg by mouth daily with breakfast.    . montelukast (SINGULAIR) 10 MG tablet Take 1 tablet by mouth at bedtime.  11  . omeprazole (PRILOSEC) 20 MG capsule Take 1 capsule by mouth daily.    Vladimir Faster Glycol-Propyl Glycol (SYSTANE OP) Place 1 drop into both eyes daily as needed (dry eyes).    . polyethylene glycol powder (GLYCOLAX/MIRALAX) powder Take 17-34 g by mouth daily. 1700 g 5  . Probiotic Product (PROBIOTIC PO) Take 1 tablet by mouth daily.    . promethazine (PHENERGAN) 25 MG tablet Take 0.5 tablets (12.5 mg total) by mouth every 6 (six) hours as needed for nausea or vomiting. 30 tablet 0  . Pyridoxine HCl (VITAMIN B-6) 500 MG tablet Take 1 tablet by mouth 2 (two) times daily.    . ranitidine (ZANTAC) 150 MG tablet Take 150 mg by mouth daily before supper.    Marland Kitchen tiZANidine (ZANAFLEX) 2 MG tablet Take 0.5-1 tablets (1-2 mg total) by  mouth 2 (two) times daily as needed for muscle spasms. 180 tablet 1  . tolterodine (DETROL LA) 2 MG 24 hr capsule Take 1 capsule (2 mg total) daily by mouth. 30 capsule 11  . traZODone (DESYREL) 50 MG tablet Take 50 mg by mouth at bedtime.      Results for orders placed or performed during the hospital encounter of 09/19/17 (from the past 48 hour(s))  Glucose, capillary     Status: None   Collection Time: 10/05/17  6:29 AM  Result Value Ref Range   Glucose-Capillary 96 70 -  99 mg/dL   No results found.  Review of Systems  Constitutional: Negative.   HENT: Negative.   Eyes: Negative.   Respiratory: Negative.   Cardiovascular: Negative.   Gastrointestinal: Negative.   Musculoskeletal: Positive for joint pain and myalgias.  Skin: Negative.   Neurological: Negative.   Psychiatric/Behavioral: Positive for depression and memory loss. Negative for hallucinations, substance abuse and suicidal ideas. The patient is nervous/anxious and has insomnia.     Blood pressure (!) 163/79, pulse (!) 50, temperature 98 F (36.7 C), temperature source Oral, resp. rate 18, height 5\' 3"  (1.6 m), weight 60.8 kg, SpO2 97 %. Physical Exam  Constitutional: She appears well-developed and well-nourished.  HENT:  Head: Normocephalic and atraumatic.  Eyes: Pupils are equal, round, and reactive to light. Conjunctivae are normal.  Neck: Normal range of motion.  Cardiovascular: Normal heart sounds.  Respiratory: Effort normal.  GI: Soft.  Musculoskeletal: Normal range of motion.  Neurological: She is alert.  Skin: Skin is warm and dry.  Psychiatric: Thought content normal. Her affect is blunt. Her speech is delayed. She is slowed. She expresses impulsivity. She exhibits abnormal recent memory.     Assessment/Plan Continue inpt ect  Alethia Berthold, MD 10/05/2017, 11:05 AM

## 2017-10-05 NOTE — Tx Team (Signed)
Interdisciplinary Treatment and Diagnostic Plan Update  10/05/2017 Time of Session: 2:00 PM Angela [name] MRN: [id number]  Principal Diagnosis: Bipolar I disorder, most recent episode mixed, severe with psychotic features ([place])  Secondary Diagnoses: Principal Problem:   Bipolar I disorder, most recent episode mixed, severe with psychotic features ([place]) Active Problems:   Hypothyroidism   Essential hypertension   Plantar fasciitis, bilateral   PTSD (post-traumatic stress disorder)   Agitation   Current Medications:  Current Facility-Administered Medications  Medication Dose Route Frequency Provider Last Rate Last Dose  . acetaminophen (TYLENOL) tablet 650 mg  650 mg Oral Q6H PRN Clapacs, Madie Reno, MD   650 mg at 10/04/17 2159  . albuterol (PROVENTIL) (2.5 MG/3ML) 0.083% nebulizer solution 2.5 mg  2.5 mg Inhalation Q6H PRN Clapacs, John T, MD      . alum & mag hydroxide-simeth (MAALOX/MYLANTA) 200-200-20 MG/5ML suspension 30 mL  30 mL Oral Q4H PRN Clapacs, John T, MD   30 mL at 09/28/17 1416  . amLODipine (NORVASC) tablet 5 mg  5 mg Oral BID Clapacs, Madie Reno, MD   5 mg at 10/05/17 1408  . calcium carbonate (TUMS - dosed in mg elemental calcium) chewable tablet 500 mg of elemental calcium  500 mg of elemental calcium Oral BID WC Pucilowska, Jolanta B, MD   500 mg of elemental calcium at 10/05/17 1351  . cholecalciferol (VITAMIN D) tablet 1,000 Units  1,000 Units Oral Daily Pucilowska, Jolanta B, MD   1,000 Units at 10/05/17 1356  . cycloSPORINE (RESTASIS) 0.05 % ophthalmic emulsion 1 drop  1 drop Both Eyes BID Clapacs, Madie Reno, MD   1 drop at 10/05/17 1349  . diclofenac sodium (VOLTAREN) 1 % transdermal gel 4 g  4 g Topical QID Pucilowska, Jolanta B, MD   4 g at 10/05/17 1349  . feeding supplement (BOOST / RESOURCE BREEZE) liquid 1 Container  1 Container Oral TID BM Clapacs, Madie Reno, MD   1 Container at 10/04/17 1400  . fentaNYL (SUBLIMAZE) injection 25 mcg  25 mcg Intravenous Q5 min PRN  Gunnar Bulla, MD      . ferrous sulfate tablet 325 mg  325 mg Oral Q breakfast Pucilowska, Jolanta B, MD   325 mg at 10/05/17 1353  . gabapentin (NEURONTIN) capsule 100 mg  100 mg Oral BID Clapacs, Madie Reno, MD   100 mg at 10/05/17 1352  . glycopyrrolate (ROBINUL) injection 0.1 mg  0.1 mg Intravenous Once Clapacs, John T, MD      . ketorolac (TORADOL) 30 MG/ML injection           . levothyroxine (SYNTHROID, LEVOTHROID) tablet 100 mcg  100 mcg Oral QAC breakfast Pucilowska, Jolanta B, MD   100 mcg at 10/05/17 1354  . lidocaine (LIDODERM) 5 % 1 patch  1 patch Transdermal Q24H Pucilowska, Jolanta B, MD   1 patch at 10/01/17 1223  . linaclotide (LINZESS) capsule 290 mcg  290 mcg Oral QAC breakfast Pucilowska, Jolanta B, MD   290 mcg at 10/04/17 0828  . lip balm (BLISTEX) ointment   Topical PRN Clapacs, John T, MD      . LORazepam (ATIVAN) tablet 0.5 mg  0.5 mg Oral Q6H PRN Clapacs, Madie Reno, MD   0.5 mg at 10/03/17 2320  . magnesium hydroxide (MILK OF MAGNESIA) suspension 30 mL  30 mL Oral Daily PRN Clapacs, John T, MD   30 mL at 10/04/17 1014  . magnesium oxide (MAG-OX) tablet 200 mg  200 mg Oral  BID Pucilowska, Jolanta B, MD   200 mg at 10/05/17 1358  . midazolam (VERSED) injection 2 mg  2 mg Intravenous Once Clapacs, John T, MD      . multivitamin with minerals tablet 1 tablet  1 tablet Oral Daily Pucilowska, Jolanta B, MD   1 tablet at 10/05/17 1356  . omega-3 acid ethyl esters (LOVAZA) capsule 1 g  1 g Oral BID Pucilowska, Jolanta B, MD   1 g at 10/05/17 1352  . ondansetron (ZOFRAN) injection 4 mg  4 mg Intravenous Once Clapacs, John T, MD      . ondansetron [facility]) injection 4 mg  4 mg Intravenous Once PRN Gunnar Fusi, MD      . ondansetron [facility]) injection 4 mg  4 mg Intravenous Once PRN Gunnar Bulla, MD      . ondansetron (ZOFRAN-ODT) disintegrating tablet 4 mg  4 mg Oral Q8H PRN Pucilowska, Jolanta B, MD   4 mg at 09/27/17 1605  . pantoprazole (PROTONIX) EC tablet 40 mg  40 mg Oral  Daily Pucilowska, Jolanta B, MD   40 mg at 10/05/17 1352  . PARoxetine (PAXIL-CR) 24 hr tablet 25 mg  25 mg Oral Daily Pucilowska, Jolanta B, MD   25 mg at 10/05/17 1355  . polyvinyl alcohol (LIQUIFILM TEARS) 1.4 % ophthalmic solution 1 drop  1 drop Both Eyes PRN Ramond Dial, MD   1 drop at 10/05/17 0630  . prazosin (MINIPRESS) capsule 2 mg  2 mg Oral BID Pucilowska, Jolanta B, MD   2 mg at 10/05/17 1407  . pyridOXINE (VITAMIN B-6) tablet 100 mg  100 mg Oral BID Clapacs, Madie Reno, MD   100 mg at 10/05/17 1354  . tiZANidine (ZANAFLEX) tablet 2 mg  2 mg Oral Q8H PRN Pucilowska, Jolanta B, MD   2 mg at 10/02/17 0106  . traZODone (DESYREL) tablet 50 mg  50 mg Oral QHS Pucilowska, Jolanta B, MD   50 mg at 10/04/17 2159  . triamcinolone cream (KENALOG) 0.1 %   Topical TID PRN Ramond Dial, MD       PTA Medications: Medications Prior to Admission  Medication Sig Dispense Refill Last Dose  . amLODipine (NORVASC) 5 MG tablet Take 1 tablet (5 mg total) by mouth 2 (two) times daily. 180 tablet 1 10/02/2017 at Unknown time  . ANORO ELLIPTA 62.5-25 MCG/INH AEPB Inhale 1 puff into the lungs daily. 14 each 0 10/02/2017 at Unknown time  . calcium carbonate (OS-CAL) 600 MG TABS tablet Take 1 tablet (600 mg total) by mouth 2 (two) times daily with a meal. (Patient taking differently: Take 600 mg 3 (three) times daily by mouth. ) 60 tablet 0 10/02/2017 at Unknown time  . cholecalciferol (VITAMIN D) 1000 units tablet Take 4,000 Units by mouth daily.   10/02/2017 at Unknown time  . citalopram (CELEXA) 20 MG tablet Take 20 mg by mouth daily.  0 10/02/2017 at Unknown time  . conjugated estrogens (PREMARIN) vaginal cream Place vaginally 3 (three) times a week. Use 1/2 gram 3-4 times weekly. 42.5 g 4 10/02/2017 at Unknown time  . cycloSPORINE (RESTASIS) 0.05 % ophthalmic emulsion Place 1 drop into both eyes 2 (two) times daily. 60 each 5 10/03/2017 at Unknown time  . diclofenac sodium (VOLTAREN) 1 % GEL Apply 2 g  topically 4 (four) times daily.   10/03/2017 at Unknown time  . ferrous sulfate (FERROUSUL) 325 (65 FE) MG tablet Take 1 tablet (325 mg total) by mouth daily with breakfast.  90 tablet 1 10/02/2017 at Unknown time  . gabapentin (NEURONTIN) 100 MG capsule Take 1-2 capsules (100-200 mg total) by mouth 3 (three) times daily. (Patient taking differently: Take 100 mg by mouth 2 (two) times daily. ) 540 capsule 1 10/02/2017 at Unknown time  . guaiFENesin (MUCINEX) 600 MG 12 hr tablet Take 1 tablet (600 mg total) by mouth 2 (two) times daily as needed for cough. (Patient taking differently: Take 600 mg by mouth daily. ) 30 tablet 0 10/02/2017 at Unknown time  . hydrocortisone (PROCTOZONE-HC) 2.5 % rectal cream Place 1 application rectally 2 (two) times daily. 30 g 1 10/02/2017 at Unknown time  . hydrOXYzine (ATARAX/VISTARIL) 10 MG tablet Take 1 tablet by mouth 2 (two) times daily as needed for anxiety.    10/02/2017 at Unknown time  . KRILL OIL OMEGA-3 PO Take 350 mg by mouth daily.   10/02/2017 at Unknown time  . levalbuterol (XOPENEX) 1.25 MG/3ML nebulizer solution Inhale 3 mLs into the lungs daily.   10/02/2017 at Unknown time  . levothyroxine (SYNTHROID, LEVOTHROID) 100 MCG tablet Take 1 tablet (100 mcg total) by mouth daily before breakfast. 90 tablet 1 10/02/2017 at Unknown time  . linaclotide (LINZESS) 290 MCG CAPS capsule Take 1 capsule (290 mcg total) by mouth daily before breakfast. 90 capsule 1 10/02/2017 at Unknown time  . magnesium oxide (MAG-OX) 400 MG tablet Take 400 mg by mouth daily with breakfast.   10/02/2017 at Unknown time  . montelukast (SINGULAIR) 10 MG tablet Take 1 tablet by mouth at bedtime.  11 10/02/2017 at Unknown time  . omeprazole (PRILOSEC) 20 MG capsule Take 1 capsule by mouth daily.   10/02/2017 at Unknown time  . Polyethyl Glycol-Propyl Glycol (SYSTANE OP) Place 1 drop into both eyes daily as needed (dry eyes).   10/02/2017 at Unknown time  . polyethylene glycol powder (GLYCOLAX/MIRALAX)  powder Take 17-34 g by mouth daily. 1700 g 5 10/02/2017 at Unknown time  . Probiotic Product (PROBIOTIC PO) Take 1 tablet by mouth daily.   10/02/2017 at Unknown time  . promethazine (PHENERGAN) 25 MG tablet Take 0.5 tablets (12.5 mg total) by mouth every 6 (six) hours as needed for nausea or vomiting. 30 tablet 0 10/02/2017 at Unknown time  . Pyridoxine HCl (VITAMIN B-6) 500 MG tablet Take 1 tablet by mouth 2 (two) times daily.   10/02/2017 at Unknown time  . ranitidine (ZANTAC) 150 MG tablet Take 150 mg by mouth daily before supper.   10/02/2017 at Unknown time  . tiZANidine (ZANAFLEX) 2 MG tablet Take 0.5-1 tablets (1-2 mg total) by mouth 2 (two) times daily as needed for muscle spasms. 180 tablet 1 10/02/2017 at Unknown time  . tolterodine (DETROL LA) 2 MG 24 hr capsule Take 1 capsule (2 mg total) daily by mouth. 30 capsule 11 10/02/2017 at Unknown time  . traZODone (DESYREL) 50 MG tablet Take 50 mg by mouth at bedtime.   10/02/2017 at Unknown time    Patient Stressors: Financial difficulties Health problems Traumatic event Other: "Pain" "fear" "trauma" "homeless"  Patient Strengths: Ability for insight Communication skills General fund of knowledge Motivation for treatment/growth  Treatment Modalities: Medication Management, Group therapy, Case management,  1 to 1 session with clinician, Psychoeducation, Recreational therapy.   Physician Treatment Plan for Primary Diagnosis: Bipolar I disorder, most recent episode mixed, severe with psychotic features ([place]) Long Term Goal(s): Improvement in symptoms so as ready for discharge Improvement in symptoms so as ready for discharge   Short Term  Goals: Ability to identify changes in lifestyle to reduce recurrence of condition will improve Ability to verbalize feelings will improve Ability to disclose and discuss suicidal ideas Ability to demonstrate self-control will improve Ability to identify and develop effective coping behaviors will  improve Ability to maintain clinical measurements within normal limits will improve Compliance with prescribed medications will improve Ability to identify triggers associated with substance abuse/mental health issues will improve NA  Medication Management: Evaluate patient's response, side effects, and tolerance of medication regimen.  Therapeutic Interventions: 1 to 1 sessions, Unit Group sessions and Medication administration.  Evaluation of Outcomes: Progressing  Physician Treatment Plan for Secondary Diagnosis: Principal Problem:   Bipolar I disorder, most recent episode mixed, severe with psychotic features ([place]) Active Problems:   Hypothyroidism   Essential hypertension   Plantar fasciitis, bilateral   PTSD (post-traumatic stress disorder)   Agitation  Long Term Goal(s): Improvement in symptoms so as ready for discharge Improvement in symptoms so as ready for discharge   Short Term Goals: Ability to identify changes in lifestyle to reduce recurrence of condition will improve Ability to verbalize feelings will improve Ability to disclose and discuss suicidal ideas Ability to demonstrate self-control will improve Ability to identify and develop effective coping behaviors will improve Ability to maintain clinical measurements within normal limits will improve Compliance with prescribed medications will improve Ability to identify triggers associated with substance abuse/mental health issues will improve NA     Medication Management: Evaluate patient's response, side effects, and tolerance of medication regimen.  Therapeutic Interventions: 1 to 1 sessions, Unit Group sessions and Medication administration.  Evaluation of Outcomes: Progressing   RN Treatment Plan for Primary Diagnosis: Bipolar I disorder, most recent episode mixed, severe with psychotic features ([place]) Long Term Goal(s): Knowledge of disease and therapeutic regimen to maintain health will improve  Short  Term Goals: Ability to identify and develop effective coping behaviors will improve and Compliance with prescribed medications will improve  Medication Management: RN will administer medications as ordered by provider, will assess and evaluate patient's response and provide education to patient for prescribed medication. RN will report any adverse and/or side effects to prescribing provider.  Therapeutic Interventions: 1 on 1 counseling sessions, Psychoeducation, Medication administration, Evaluate responses to treatment, Monitor vital signs and CBGs as ordered, Perform/monitor CIWA, COWS, AIMS and Fall Risk screenings as ordered, Perform wound care treatments as ordered.  Evaluation of Outcomes: Progressing   LCSW Treatment Plan for Primary Diagnosis: Bipolar I disorder, most recent episode mixed, severe with psychotic features ([place]) Long Term Goal(s): Safe transition to appropriate next level of care at discharge, Engage patient in therapeutic group addressing interpersonal concerns.  Short Term Goals: Engage patient in aftercare planning with referrals and resources, Increase social support and Increase skills for wellness and recovery  Therapeutic Interventions: Assess for all discharge needs, 1 to 1 time with Social worker, Explore available resources and support systems, Assess for adequacy in community support network, Educate family and significant other(s) on suicide prevention, Complete Psychosocial Assessment, Interpersonal group therapy.  Evaluation of Outcomes: Progressing   Progress in Treatment: Attending groups: Yes. Participating in groups: Yes. Taking medication as prescribed: Yes. Toleration medication: Yes. Family/Significant other contact made: No, will contact:  pt declined consent Patient understands diagnosis: Yes. Discussing patient identified problems/goals with staff: Yes. Medical problems stabilized or resolved: Yes. Denies suicidal/homicidal ideation:  Yes. Issues/concerns per patient self-inventory: No. Other: none  New problem(s) identified: No, Describe:  none  New Short Term/Long Term Goal(s):  Patient Goals: Currently receiving ECT treatment  Discharge Plan or Barriers: TBD.  Pt is making some progress with ECT, but still quite irritable and demanding. Pt is also in need of securing a place to live that is appropriate and safe.  Reason for Continuation of Hospitalization: Depression, irritability, disorganization, refusing medications at times. Pt needs to continue ECT.  Estimated Length of Stay: 5-7 days.  Attendees: Patient: 10/01/2017   Physician: Dr. Alethia Berthold, MD 10/01/2017   Nursing: Tyler Pita,  RN 10/01/2017  RN Care Manager:   Social Worker:Vasil Juhasz Eulas Post, LCSW 10/01/2017  Recreational Therapist: Roanna Epley, LRT 10/01/2017  Other: Darin Engels LCSWA 10/01/2017  Other:    Other:     Scribe for Treatment Team: Devona Konig, LCSW 10/05/2017 2:10 PM

## 2017-10-05 NOTE — Progress Notes (Signed)
Received Angela [name] this AM in her room asleep.  Later she was transported to the PACU for her ECT. She returned without incident and rested in her bed, then she attended the afternoon group therapy session. She was compliant with her medications. The dietitian arrived per order to visit her and they talked in depth.

## 2017-10-05 NOTE — Anesthesia Postprocedure Evaluation (Signed)
Anesthesia Post Note  Patient: Angela [name]  Procedure(s) Performed: ECT TX  Patient location during evaluation: PACU Anesthesia Type: General Level of consciousness: awake and alert Pain management: pain level controlled Vital Signs Assessment: post-procedure vital signs reviewed and stable Respiratory status: spontaneous breathing, nonlabored ventilation, respiratory function stable and patient connected to nasal cannula oxygen Cardiovascular status: blood pressure returned to baseline and stable Postop Assessment: no apparent nausea or vomiting Anesthetic complications: no     Last Vitals:  Vitals:   10/05/17 1200 10/05/17 1406  BP: 113/64 (!) 151/73  Pulse: 60 (!) 55  Resp: (!) 22 18  Temp:  36.8 C  SpO2: 97% 100%    Last Pain:  Vitals:   10/05/17 1406  TempSrc: Oral  PainSc:                  [name] Fay Swider

## 2017-10-05 NOTE — Anesthesia Post-op Follow-up Note (Signed)
Anesthesia QCDR form completed.        

## 2017-10-05 NOTE — [facility] Group Notes (Signed)
[facility] Group Notes:  (Nursing/MHT/Case Management/Adjunct)  Date:  10/05/2017  Time:  9:38 PM  Type of Therapy:  Group Therapy  Participation Level:  Active  Participation Quality:  Appropriate  Affect:  Appropriate  Cognitive:  Alert  Insight:  Good  Engagement in Group:  Engaged  Modes of Intervention:  Support  Summary of Progress/Problems:  Angela [name] 10/05/2017, 9:38 PM

## 2017-10-06 DIAGNOSIS — K5909 Other constipation: Secondary | ICD-10-CM | POA: Diagnosis present

## 2017-10-06 DIAGNOSIS — Z79890 Hormone replacement therapy (postmenopausal): Secondary | ICD-10-CM | POA: Diagnosis not present

## 2017-10-06 DIAGNOSIS — F312 Bipolar disorder, current episode manic severe with psychotic features: Secondary | ICD-10-CM | POA: Diagnosis not present

## 2017-10-06 DIAGNOSIS — Z85828 Personal history of other malignant neoplasm of skin: Secondary | ICD-10-CM | POA: Diagnosis not present

## 2017-10-06 DIAGNOSIS — G4089 Other seizures: Secondary | ICD-10-CM | POA: Diagnosis present

## 2017-10-06 DIAGNOSIS — Z818 Family history of other mental and behavioral disorders: Secondary | ICD-10-CM | POA: Diagnosis not present

## 2017-10-06 DIAGNOSIS — Z7952 Long term (current) use of systemic steroids: Secondary | ICD-10-CM | POA: Diagnosis not present

## 2017-10-06 DIAGNOSIS — I10 Essential (primary) hypertension: Secondary | ICD-10-CM | POA: Diagnosis present

## 2017-10-06 DIAGNOSIS — Z791 Long term (current) use of non-steroidal anti-inflammatories (NSAID): Secondary | ICD-10-CM | POA: Diagnosis not present

## 2017-10-06 DIAGNOSIS — F4310 Post-traumatic stress disorder, unspecified: Secondary | ICD-10-CM | POA: Diagnosis present

## 2017-10-06 DIAGNOSIS — Z590 Homelessness: Secondary | ICD-10-CM | POA: Diagnosis not present

## 2017-10-06 DIAGNOSIS — G4700 Insomnia, unspecified: Secondary | ICD-10-CM | POA: Diagnosis present

## 2017-10-06 DIAGNOSIS — Z79899 Other long term (current) drug therapy: Secondary | ICD-10-CM | POA: Diagnosis not present

## 2017-10-06 DIAGNOSIS — Z8249 Family history of ischemic heart disease and other diseases of the circulatory system: Secondary | ICD-10-CM | POA: Diagnosis not present

## 2017-10-06 DIAGNOSIS — Z87891 Personal history of nicotine dependence: Secondary | ICD-10-CM | POA: Diagnosis not present

## 2017-10-06 DIAGNOSIS — B9689 Other specified bacterial agents as the cause of diseases classified elsewhere: Secondary | ICD-10-CM | POA: Diagnosis not present

## 2017-10-06 DIAGNOSIS — F3164 Bipolar disorder, current episode mixed, severe, with psychotic features: Secondary | ICD-10-CM | POA: Diagnosis present

## 2017-10-06 DIAGNOSIS — R45851 Suicidal ideations: Secondary | ICD-10-CM | POA: Diagnosis present

## 2017-10-06 DIAGNOSIS — E039 Hypothyroidism, unspecified: Secondary | ICD-10-CM | POA: Diagnosis present

## 2017-10-06 DIAGNOSIS — Z91410 Personal history of adult physical and sexual abuse: Secondary | ICD-10-CM | POA: Diagnosis not present

## 2017-10-06 DIAGNOSIS — Z23 Encounter for immunization: Secondary | ICD-10-CM | POA: Diagnosis not present

## 2017-10-06 DIAGNOSIS — J019 Acute sinusitis, unspecified: Secondary | ICD-10-CM | POA: Diagnosis not present

## 2017-10-06 NOTE — Progress Notes (Signed)
D- Patient alert and oriented. Patient presents in a pleasant mood on assessment stating that she slept well last night "yes, I did". Patient denies SI, HI, AVH, and pain at this time stating to this writer "no, I;m beginning to come out of my shell". Patient also denies any signs/symptoms of depression and anxiety. Patient had no stated goals for today.  A- Scheduled medications administered to patient, per MD orders. Support and encouragement provided.  Routine safety checks conducted every 15 minutes.  Patient informed to notify staff with problems or concerns.  R- No adverse drug reactions noted. Patient contracts for safety at this time. Patient compliant with medications and treatment plan. Patient receptive, calm, and cooperative. Patient interacts well with others on the unit.  Patient remains safe at this time.

## 2017-10-06 NOTE — Progress Notes (Signed)
[facility] MD Progress Note  10/06/2017 4:23 PM Angela [name]  MRN:  [id number] Subjective: pt seen and chart reviewed. She reports feeling "much better" and "finally, I get the real Angela [name] back". She said that lost 7 members of family and friends recently, and was very sad.  She feels that ECT has been very helpful and wants to continues.   She was concerned about mild rash on her hands, legs and abdomin, and "only happens" when she comes to the ED. However, I only see mild rash on her hands and forearm, and there is one single "dot" on her leg and one "red dot" on her abdomen.  She believes that is scabies, which is very unlikely, and requested Permethrin cream, and wants to have dermatology consult.  She is informed that we are going to monitor her rash, and would call for consult if it gets worse.   Otherwise, she said that her mood is good and no SI or Hi. No AVH.   Principal Problem: Bipolar I disorder, most recent episode mixed, severe with psychotic features ([place]) Diagnosis:   Patient Active Problem List   Diagnosis Date Noted  . Bipolar I disorder, most recent episode mixed, severe with psychotic features ([place]) [F31.64] 09/18/2017  . Agitation [R45.1] 09/18/2017  . IBS (irritable bowel syndrome) [K58.9] 11/17/2016  . Bilateral carpal tunnel syndrome [G56.03] 11/17/2016  . Plantar fasciitis, bilateral [M72.2] 11/17/2016  . Chronic pain syndrome [G89.4] 11/17/2016  . Chronic hyponatremia [E87.1] 11/17/2016  . PTSD (post-traumatic stress disorder) [F43.10] 11/17/2016  . Hernia, hiatal [K44.9] 11/17/2016  . Vitamin D deficiency [E55.9] 11/17/2016  . Vitamin B12 deficiency [E53.8] 11/17/2016  . SIADH (syndrome of inappropriate ADH production) ([place]) [E22.2] 09/08/2016  . Cervical spondylosis with myelopathy [M47.12] 05/24/2016  . Post-concussion headache [G44.309] 05/24/2016  . MCI (mild cognitive impairment) with memory loss [G31.84] 03/30/2016  . Gait abnormality [R26.9] 03/30/2016   . Chronic bipolar affective disorder ([place]) [F31.9] 01/24/2016  . Anemia, iron deficiency [D50.9] 06/24/2015  . Endometriosis [N80.9] 06/24/2015  . Essential hypertension [I10] 06/24/2015  . History of postmenopausal bleeding [Z87.42] 05/22/2015  . History of TIA (transient ischemic attack) [Z86.73] 04/20/2015  . Hypothyroidism [E03.9] 04/20/2015  . GERD (gastroesophageal reflux disease) [K21.9] 04/20/2015  . Cervical disc disorder with radiculopathy [M50.10] 03/25/2015  . Chronic neck pain [M54.2, G89.29] 03/25/2015  . Pelvic pain in female [R10.2] 10/30/2014  . History of skin cancer [Z85.828] 06/30/2014  . Chronic headaches [R51] 12/16/2013  . Neuropathy [G62.9] 12/16/2013   Total Time spent with patient: 20 minutes  Past Psychiatric History: Long history of chronic bipolar disorder and personality disorder and poor function  Past Medical History:  Past Medical History:  Diagnosis Date  . Allergic rhinitis   . Anemia   . Blurred vision   . Depression   . Diverticulosis   . Endometriosis   . Falls   . GERD (gastroesophageal reflux disease)   . Hernia, hiatal   . Hypothyroidism   . IBS (irritable bowel syndrome)   . Malignant neoplasm of skin   . Migraine   . Neuropathy   . PTSD (post-traumatic stress disorder)   . Thyroid disease     Past Surgical History:  Procedure Laterality Date  . APPENDECTOMY    . CHOLECYSTECTOMY    . CHOLECYSTECTOMY, LAPAROSCOPIC    . ESOPHAGOGASTRODUODENOSCOPY (EGD) WITH PROPOFOL N/A 03/29/2015   Procedure: ESOPHAGOGASTRODUODENOSCOPY (EGD) WITH PROPOFOL;  Surgeon: Josefine Class, MD;  Location: [facility] ENDOSCOPY;  Service: Endoscopy;  Laterality:  N/A;  . HERNIA REPAIR    . laproscopy    . TONSILLECTOMY    . uteral suspension     Family History:  Family History  Problem Relation Age of Onset  . Heart disease Father   . Cancer Mother        lung  . Urolithiasis Neg Hx   . Kidney disease Neg Hx   . Kidney cancer Neg Hx   . Prostate  cancer Neg Hx    Family Psychiatric  History: See previous note Social History:  Social History   Substance and Sexual Activity  Alcohol Use No     Social History   Substance and Sexual Activity  Drug Use No    Social History   Socioeconomic History  . Marital status: Single    Spouse name: Not on file  . Number of children: 0  . Years of education: 1.5 years of college  . Highest education level: Not on file  Occupational History  . Occupation: Retired  Scientific laboratory technician  . Financial resource strain: Not on file  . Food insecurity:    Worry: Not on file    Inability: Not on file  . Transportation needs:    Medical: Not on file    Non-medical: Not on file  Tobacco Use  . Smoking status: Former Smoker    Last attempt to quit: 11/17/1976    Years since quitting: 40.9  . Smokeless tobacco: Never Used  Substance and Sexual Activity  . Alcohol use: No  . Drug use: No  . Sexual activity: Not Currently  Lifestyle  . Physical activity:    Days per week: Not on file    Minutes per session: Not on file  . Stress: Not on file  Relationships  . Social connections:    Talks on phone: Not on file    Gets together: Not on file    Attends religious service: Not on file    Active member of club or organization: Not on file    Attends meetings of clubs or organizations: Not on file    Relationship status: Not on file  Other Topics Concern  . Not on file  Social History Narrative   Lives at home alone.   Right-handed.   No daily caffeine use.   Additional Social History:    Pain Medications: See PTA Prescriptions: See PTA Over the Counter: See PTA History of alcohol / drug use?: No history of alcohol / drug abuse Longest period of sobriety (when/how long): Reports of none Negative Consequences of Use: (n/a) Withdrawal Symptoms: (n/a)  Sleep: Fair  Appetite:  Fair  Current Medications: Current Facility-Administered Medications  Medication Dose Route Frequency  Provider Last Rate Last Dose  . acetaminophen (TYLENOL) tablet 650 mg  650 mg Oral Q6H PRN Clapacs, Madie Reno, MD   650 mg at 10/04/17 2159  . albuterol (PROVENTIL) (2.5 MG/3ML) 0.083% nebulizer solution 2.5 mg  2.5 mg Inhalation Q6H PRN Clapacs, John T, MD      . alum & mag hydroxide-simeth (MAALOX/MYLANTA) 200-200-20 MG/5ML suspension 30 mL  30 mL Oral Q4H PRN Clapacs, John T, MD   30 mL at 09/28/17 1416  . amLODipine (NORVASC) tablet 5 mg  5 mg Oral BID Clapacs, Madie Reno, MD   5 mg at 10/06/17 0803  . calcium carbonate (TUMS - dosed in mg elemental calcium) chewable tablet 500 mg of elemental calcium  500 mg of elemental calcium Oral BID WC Pucilowska, Jolanta B, MD  500 mg of elemental calcium at 10/06/17 1018  . cholecalciferol (VITAMIN D) tablet 1,000 Units  1,000 Units Oral Daily Pucilowska, Jolanta B, MD   1,000 Units at 10/06/17 1021  . cycloSPORINE (RESTASIS) 0.05 % ophthalmic emulsion 1 drop  1 drop Both Eyes BID Clapacs, Madie Reno, MD   1 drop at 10/06/17 1024  . diclofenac sodium (VOLTAREN) 1 % transdermal gel 4 g  4 g Topical QID Pucilowska, Jolanta B, MD   4 g at 10/06/17 1024  . feeding supplement (BOOST / RESOURCE BREEZE) liquid 1 Container  1 Container Oral TID BM Clapacs, Madie Reno, MD   1 Container at 10/05/17 1700  . fentaNYL (SUBLIMAZE) injection 25 mcg  25 mcg Intravenous Q5 min PRN Gunnar Bulla, MD      . ferrous sulfate tablet 325 mg  325 mg Oral Q breakfast Pucilowska, Jolanta B, MD   325 mg at 10/06/17 1020  . gabapentin (NEURONTIN) capsule 100 mg  100 mg Oral BID Clapacs, John T, MD   100 mg at 10/06/17 1019  . glycopyrrolate (ROBINUL) injection 0.1 mg  0.1 mg Intravenous Once Clapacs, John T, MD      . hydrocortisone cream 1 %   Topical BID Clapacs, John T, MD      . levothyroxine (SYNTHROID, LEVOTHROID) tablet 100 mcg  100 mcg Oral QAC breakfast Pucilowska, Jolanta B, MD   100 mcg at 10/06/17 0803  . lidocaine (LIDODERM) 5 % 1 patch  1 patch Transdermal Q24H Pucilowska, Jolanta  B, MD   1 patch at 10/01/17 1223  . linaclotide (LINZESS) capsule 290 mcg  290 mcg Oral QAC breakfast Pucilowska, Jolanta B, MD   290 mcg at 10/06/17 0804  . lip balm (BLISTEX) ointment   Topical PRN Clapacs, John T, MD      . LORazepam (ATIVAN) tablet 0.5 mg  0.5 mg Oral Q6H PRN Clapacs, Madie Reno, MD   0.5 mg at 10/03/17 2320  . magnesium hydroxide (MILK OF MAGNESIA) suspension 30 mL  30 mL Oral Daily PRN Clapacs, Madie Reno, MD   30 mL at 10/05/17 1731  . magnesium oxide (MAG-OX) tablet 200 mg  200 mg Oral BID Pucilowska, Jolanta B, MD   200 mg at 10/06/17 1020  . midazolam (VERSED) injection 2 mg  2 mg Intravenous Once Clapacs, John T, MD      . multivitamin with minerals tablet 1 tablet  1 tablet Oral Daily Pucilowska, Jolanta B, MD   1 tablet at 10/06/17 1018  . omega-3 acid ethyl esters (LOVAZA) capsule 1 g  1 g Oral BID Pucilowska, Jolanta B, MD   1 g at 10/06/17 1018  . ondansetron (ZOFRAN) injection 4 mg  4 mg Intravenous Once Clapacs, John T, MD      . ondansetron [facility]) injection 4 mg  4 mg Intravenous Once PRN Gunnar Fusi, MD      . ondansetron [facility]) injection 4 mg  4 mg Intravenous Once PRN Gunnar Bulla, MD      . ondansetron (ZOFRAN-ODT) disintegrating tablet 4 mg  4 mg Oral Q8H PRN Pucilowska, Jolanta B, MD   4 mg at 09/27/17 1605  . pantoprazole (PROTONIX) EC tablet 40 mg  40 mg Oral Daily Pucilowska, Jolanta B, MD   40 mg at 10/06/17 0803  . PARoxetine (PAXIL-CR) 24 hr tablet 25 mg  25 mg Oral Daily Pucilowska, Jolanta B, MD   25 mg at 10/06/17 1019  . polyvinyl alcohol (LIQUIFILM TEARS) 1.4 %  ophthalmic solution 1 drop  1 drop Both Eyes PRN Ramond Dial, MD   1 drop at 10/05/17 0630  . prazosin (MINIPRESS) capsule 2 mg  2 mg Oral BID Pucilowska, Jolanta B, MD   2 mg at 10/06/17 1020  . pyridOXINE (VITAMIN B-6) tablet 100 mg  100 mg Oral BID Clapacs, John T, MD   100 mg at 10/06/17 1019  . tiZANidine (ZANAFLEX) tablet 2 mg  2 mg Oral Q8H PRN Pucilowska, Jolanta B, MD    2 mg at 10/02/17 0106  . traZODone (DESYREL) tablet 50 mg  50 mg Oral QHS Pucilowska, Jolanta B, MD   50 mg at 10/05/17 2209  . triamcinolone cream (KENALOG) 0.1 %   Topical TID PRN Ramond Dial, MD        Lab Results:  Results for orders placed or performed during the hospital encounter of 09/19/17 (from the past 48 hour(s))  Glucose, capillary     Status: None   Collection Time: 10/05/17  6:29 AM  Result Value Ref Range   Glucose-Capillary 96 70 - 99 mg/dL    Blood Alcohol level:  Lab Results  Component Value Date   ETH <5 09/29/2016   ETH <5 22/02/5425    Metabolic Disorder Labs: Lab Results  Component Value Date   HGBA1C 5.7 (H) 09/20/2017   MPG 116.89 09/20/2017   No results found for: PROLACTIN Lab Results  Component Value Date   CHOL 173 09/20/2017   TRIG 193 (H) 09/20/2017   HDL 50 09/20/2017   CHOLHDL 3.5 09/20/2017   VLDL 39 09/20/2017   LDLCALC 84 09/20/2017   LDLCALC 87 09/04/2017    Physical Findings: AIMS:  , ,  ,  ,    CIWA:    COWS:     Musculoskeletal: Strength & Muscle Tone: within normal limits Gait & Station: normal Patient leans: N/A  Psychiatric Specialty Exam: Physical Exam  Nursing note and vitals reviewed. Constitutional: She appears well-developed and well-nourished.  HENT:  Head: Normocephalic and atraumatic.  Eyes: Pupils are equal, round, and reactive to light. Conjunctivae are normal.  Neck: Normal range of motion.  Cardiovascular: Regular rhythm and normal heart sounds.  Respiratory: Effort normal. No respiratory distress.  GI: Soft.  Musculoskeletal: Normal range of motion.  Neurological: She is alert.  Skin: Skin is warm and dry.  Psychiatric: Her affect is not blunt. She is agitated. She is not aggressive. Thought content is not paranoid. She expresses impulsivity. She exhibits a depressed mood. She expresses no homicidal and no suicidal ideation. She exhibits abnormal recent memory.    Review of Systems   Constitutional: Negative.   HENT: Negative.   Eyes: Negative.   Respiratory: Negative.   Cardiovascular: Negative.   Gastrointestinal: Negative.   Musculoskeletal: Negative.   Skin: Negative.   Neurological: Negative.   Psychiatric/Behavioral: Positive for depression and memory loss. Negative for hallucinations, substance abuse and suicidal ideas. The patient is nervous/anxious and has insomnia.     Blood pressure 121/62, pulse (!) 51, temperature 98.6 F (37 C), temperature source Oral, resp. rate 18, height '5\' 3"'$  (1.6 m), weight 60.8 kg, SpO2 99 %.Body mass index is 23.74 kg/m.  General Appearance: Casual  Eye Contact:  Fair  Speech:  Clear and Coherent  Volume:  Decreased  Mood:  Dysphoric  Affect:  Congruent  Thought Process:  Goal Directed  Orientation:  Full (Time, Place, and Person)  Thought Content:  Rumination and Tangential  Suicidal Thoughts:  No  Homicidal  Thoughts:  No  Memory:  Immediate;   Fair Recent;   Fair Remote;   Fair  Judgement:  Impaired  Insight:  Shallow  Psychomotor Activity:  Decreased  Concentration:  Concentration: Poor  Recall:  [name]:  Fair  Language:  Fair        Assets:  Desire for Improvement  ADL's:  Intact  Cognition:  Impaired,  Mild  Sleep:  Number of Hours: 6.5     Treatment Plan Summary: Daily contact with patient to assess and evaluate symptoms and progress in treatment, Medication management and Plan ECT without difficulty.  Slept a little better last night.  As usual when Angela [name] stopped to talk with her one-on-one she has a large number of complaints but left to her own devices she is less disorganized and more appropriate.  Treatment team met today and discussed discharge planning.  It will be difficult finding appropriate disposition for her so we will have to start looking for places.  Asked the patient to please try and be involved with that.  For now continue current medication.   Plan: # Bipolar  Affective disorder -- continue ECT -- continue paxil '20mg'$  daily, watching for anticholinergic side effects.  -- no SI or HI, no AVH.    # PTSD: -- continue Prozac '2mg'$  BID.  -- continue Trazodone '50mg'$  qhs.  -- recommend trauma therapy as outpatient   # Rash -- continue Triamcinolone cream.  -- no indication for Permethrin for scabies at this time.  Will continue monitor.    Dispo: -- defer to primary team.    Denny Mccree, MD 10/06/2017, 4:23 PM

## 2017-10-06 NOTE — Plan of Care (Signed)
  Problem: Education: Goal: Ability to make informed decisions regarding treatment will improve Outcome: Progressing  Patient able to make informed decisions.

## 2017-10-06 NOTE — [facility] Group Notes (Signed)
LCSW Group Therapy Note  10/06/2017 1:15pm  Type of Therapy and Topic: Group Therapy: Holding on to Grudges   Participation Level: Active   Description of Group:  In this group patients will be asked to explore and define a grudge. Patients will be guided to discuss their thoughts, feelings, and reasons as to why people have grudges. Patients will process the impact grudges have on daily life and identify thoughts and feelings related to holding grudges. Facilitator will challenge patients to identify ways to let go of grudges and the benefits this provides. Patients will be confronted to address why one struggles letting go of grudges. Lastly, patients will identify feelings and thoughts related to what life would look like without grudges. This group will be process-oriented, with patients participating in exploration of their own experiences, giving and receiving support, and processing challenge from other group members.  Therapeutic Goals:  1. Patient will identify specific grudges related to their personal life.  2. Patient will identify feelings, thoughts, and beliefs around grudges.  3. Patient will identify how one releases grudges appropriately.  4. Patient will identify situations where they could have let go of the grudge, but instead chose to hold on.   Summary of Patient Progress: The patient scored his/her mood at a 8-9 (10 best). Patients were guided to discuss their thoughts, feelings, and reasons as to why people have grudges. The patient was able to process the impact grudges have on daily life and identified thoughts and feelings related to holding grudges. The patient was challenged to identify ways to let go of grudges and the benefits this provides. Pt. actively and appropriately engaged in the group. Patient was able to provide support and validation to other group members.   Therapeutic Modalities:  Cognitive Behavioral Therapy  Solution Focused Therapy  Motivational  Interviewing  Brief Therapy   Presten Joost  [name], LCSW 10/06/2017 12:41 PM

## 2017-10-06 NOTE — Progress Notes (Signed)
Patient alert and oriented x 4, denies SI/HI/AVH, affect is flat and sad, she appears angry, sometimes tearful she expressed satisfaction over ECT sated " I think its working"   She looks less anxious, she is less intrusive , thoughts are organized and coherent no bizarre behavior noted, interacting appropriately with peers and staff, no distress note.Support and encouragement given, was encouraged to attend evening wrap up group.  Patient attends evening wrap up group, 15 minutes safety checks maintained, will continue to monitor.

## 2017-10-07 ENCOUNTER — Other Ambulatory Visit: Payer: Self-pay | Admitting: Psychiatry

## 2017-10-07 MED ORDER — TRAZODONE HCL 100 MG PO TABS
100.0000 mg | ORAL_TABLET | Freq: Every evening | ORAL | Status: DC | PRN
  Administered 2017-10-07 – 2017-10-14 (×3): 100 mg via ORAL
  Filled 2017-10-07 (×5): qty 1

## 2017-10-07 NOTE — Plan of Care (Signed)
Pt. Behavior this evening much more appropriate, affect brighter. Pt. Denies SI/HI, verbally able to contract for safety. Pt. Complaint with medications. Pt. Able to verbalize positive feelings this evening.    Problem: Medication: Goal: Compliance with prescribed medication regimen will improve Outcome: Progressing   Problem: Self-Concept: Goal: Ability to disclose and discuss suicidal ideas will improve Outcome: Progressing Goal: Will verbalize positive feelings about self Outcome: Progressing   Problem: Safety: Goal: Ability to remain free from injury will improve Outcome: Progressing

## 2017-10-07 NOTE — Progress Notes (Signed)
[facility] MD Progress Note  10/07/2017 10:55 AM Angela [name]  MRN:  [id number]   Subjective: Pt seen and chart reviewed. She continues to report feeling "good", yet still worried about the rash, which are a few individual, isolated red dots on her thigh, and forearm with no change from yesterday.  She is encouraged to sue the cream (triamcinolone) for it.   Then she requested to take her synthroid and Norvasc together in the morning.  She is informed that she does take both in the morning, but there is an extra dose of Norvasc in the evening.   She requested dermatology consult, and is informed that we will defer that to her primary team, while we are monitoring the rash, and would call the consult if it gets worse.    Otherwise, she said that her mood is good and no SI or Hi. No AVH.   Principal Problem: Bipolar I disorder, most recent episode mixed, severe with psychotic features ([place]) Diagnosis:   Patient Active Problem List   Diagnosis Date Noted  . Bipolar I disorder, most recent episode mixed, severe with psychotic features ([place]) [F31.64] 09/18/2017  . Agitation [R45.1] 09/18/2017  . IBS (irritable bowel syndrome) [K58.9] 11/17/2016  . Bilateral carpal tunnel syndrome [G56.03] 11/17/2016  . Plantar fasciitis, bilateral [M72.2] 11/17/2016  . Chronic pain syndrome [G89.4] 11/17/2016  . Chronic hyponatremia [E87.1] 11/17/2016  . PTSD (post-traumatic stress disorder) [F43.10] 11/17/2016  . Hernia, hiatal [K44.9] 11/17/2016  . Vitamin D deficiency [E55.9] 11/17/2016  . Vitamin B12 deficiency [E53.8] 11/17/2016  . SIADH (syndrome of inappropriate ADH production) ([place]) [E22.2] 09/08/2016  . Cervical spondylosis with myelopathy [M47.12] 05/24/2016  . Post-concussion headache [G44.309] 05/24/2016  . MCI (mild cognitive impairment) with memory loss [G31.84] 03/30/2016  . Gait abnormality [R26.9] 03/30/2016  . Chronic bipolar affective disorder ([place]) [F31.9] 01/24/2016  . Anemia, iron  deficiency [D50.9] 06/24/2015  . Endometriosis [N80.9] 06/24/2015  . Essential hypertension [I10] 06/24/2015  . History of postmenopausal bleeding [Z87.42] 05/22/2015  . History of TIA (transient ischemic attack) [Z86.73] 04/20/2015  . Hypothyroidism [E03.9] 04/20/2015  . GERD (gastroesophageal reflux disease) [K21.9] 04/20/2015  . Cervical disc disorder with radiculopathy [M50.10] 03/25/2015  . Chronic neck pain [M54.2, G89.29] 03/25/2015  . Pelvic pain in female [R10.2] 10/30/2014  . History of skin cancer [Z85.828] 06/30/2014  . Chronic headaches [R51] 12/16/2013  . Neuropathy [G62.9] 12/16/2013   Total Time spent with patient: 20 minutes  Past Psychiatric History: Long history of chronic bipolar disorder and personality disorder and poor function  Past Medical History:  Past Medical History:  Diagnosis Date  . Allergic rhinitis   . Anemia   . Blurred vision   . Depression   . Diverticulosis   . Endometriosis   . Falls   . GERD (gastroesophageal reflux disease)   . Hernia, hiatal   . Hypothyroidism   . IBS (irritable bowel syndrome)   . Malignant neoplasm of skin   . Migraine   . Neuropathy   . PTSD (post-traumatic stress disorder)   . Thyroid disease     Past Surgical History:  Procedure Laterality Date  . APPENDECTOMY    . CHOLECYSTECTOMY    . CHOLECYSTECTOMY, LAPAROSCOPIC    . ESOPHAGOGASTRODUODENOSCOPY (EGD) WITH PROPOFOL N/A 03/29/2015   Procedure: ESOPHAGOGASTRODUODENOSCOPY (EGD) WITH PROPOFOL;  Surgeon: Josefine Class, MD;  Location: [facility] ENDOSCOPY;  Service: Endoscopy;  Laterality: N/A;  . HERNIA REPAIR    . laproscopy    . TONSILLECTOMY    .  uteral suspension     Family History:  Family History  Problem Relation Age of Onset  . Heart disease Father   . Cancer Mother        lung  . Urolithiasis Neg Hx   . Kidney disease Neg Hx   . Kidney cancer Neg Hx   . Prostate cancer Neg Hx    Family Psychiatric  History: See previous note Social  History:  Social History   Substance and Sexual Activity  Alcohol Use No     Social History   Substance and Sexual Activity  Drug Use No    Social History   Socioeconomic History  . Marital status: Single    Spouse name: Not on file  . Number of children: 0  . Years of education: 1.5 years of college  . Highest education level: Not on file  Occupational History  . Occupation: Retired  Scientific laboratory technician  . Financial resource strain: Not on file  . Food insecurity:    Worry: Not on file    Inability: Not on file  . Transportation needs:    Medical: Not on file    Non-medical: Not on file  Tobacco Use  . Smoking status: Former Smoker    Last attempt to quit: 11/17/1976    Years since quitting: 40.9  . Smokeless tobacco: Never Used  Substance and Sexual Activity  . Alcohol use: No  . Drug use: No  . Sexual activity: Not Currently  Lifestyle  . Physical activity:    Days per week: Not on file    Minutes per session: Not on file  . Stress: Not on file  Relationships  . Social connections:    Talks on phone: Not on file    Gets together: Not on file    Attends religious service: Not on file    Active member of club or organization: Not on file    Attends meetings of clubs or organizations: Not on file    Relationship status: Not on file  Other Topics Concern  . Not on file  Social History Narrative   Lives at home alone.   Right-handed.   No daily caffeine use.   Additional Social History:    Pain Medications: See PTA Prescriptions: See PTA Over the Counter: See PTA History of alcohol / drug use?: No history of alcohol / drug abuse Longest period of sobriety (when/how long): Reports of none Negative Consequences of Use: (n/a) Withdrawal Symptoms: (n/a)  Sleep: Fair  Appetite:  Fair  Current Medications: Current Facility-Administered Medications  Medication Dose Route Frequency Provider Last Rate Last Dose  . acetaminophen (TYLENOL) tablet 650 mg  650 mg  Oral Q6H PRN Clapacs, Madie Reno, MD   650 mg at 10/04/17 2159  . albuterol (PROVENTIL) (2.5 MG/3ML) 0.083% nebulizer solution 2.5 mg  2.5 mg Inhalation Q6H PRN Clapacs, John T, MD      . alum & mag hydroxide-simeth (MAALOX/MYLANTA) 200-200-20 MG/5ML suspension 30 mL  30 mL Oral Q4H PRN Clapacs, John T, MD   30 mL at 09/28/17 1416  . amLODipine (NORVASC) tablet 5 mg  5 mg Oral BID Clapacs, Madie Reno, MD   5 mg at 10/06/17 0803  . calcium carbonate (TUMS - dosed in mg elemental calcium) chewable tablet 500 mg of elemental calcium  500 mg of elemental calcium Oral BID WC Pucilowska, Jolanta B, MD   500 mg of elemental calcium at 10/06/17 1800  . cholecalciferol (VITAMIN D) tablet 1,000 Units  1,000 Units Oral Daily Pucilowska, Jolanta B, MD   1,000 Units at 10/06/17 1021  . cycloSPORINE (RESTASIS) 0.05 % ophthalmic emulsion 1 drop  1 drop Both Eyes BID Clapacs, Madie Reno, MD   1 drop at 10/06/17 1800  . diclofenac sodium (VOLTAREN) 1 % transdermal gel 4 g  4 g Topical QID Pucilowska, Jolanta B, MD   4 g at 10/06/17 2109  . feeding supplement (BOOST / RESOURCE BREEZE) liquid 1 Container  1 Container Oral TID BM Clapacs, Madie Reno, MD   1 Container at 10/06/17 2005  . fentaNYL (SUBLIMAZE) injection 25 mcg  25 mcg Intravenous Q5 min PRN Gunnar Bulla, MD      . ferrous sulfate tablet 325 mg  325 mg Oral Q breakfast Pucilowska, Jolanta B, MD   325 mg at 10/06/17 1020  . gabapentin (NEURONTIN) capsule 100 mg  100 mg Oral BID Clapacs, John T, MD   100 mg at 10/06/17 1800  . glycopyrrolate (ROBINUL) injection 0.1 mg  0.1 mg Intravenous Once Clapacs, John T, MD      . hydrocortisone cream 1 %   Topical BID Clapacs, John T, MD      . levothyroxine (SYNTHROID, LEVOTHROID) tablet 100 mcg  100 mcg Oral QAC breakfast Pucilowska, Jolanta B, MD   100 mcg at 10/07/17 6812  . lidocaine (LIDODERM) 5 % 1 patch  1 patch Transdermal Q24H Pucilowska, Jolanta B, MD   1 patch at 10/01/17 1223  . linaclotide (LINZESS) capsule 290 mcg  290  mcg Oral QAC breakfast Pucilowska, Jolanta B, MD   290 mcg at 10/07/17 7517  . lip balm (BLISTEX) ointment   Topical PRN Clapacs, John T, MD      . LORazepam (ATIVAN) tablet 0.5 mg  0.5 mg Oral Q6H PRN Clapacs, Madie Reno, MD   0.5 mg at 10/06/17 2110  . magnesium hydroxide (MILK OF MAGNESIA) suspension 30 mL  30 mL Oral Daily PRN Clapacs, Madie Reno, MD   30 mL at 10/05/17 1731  . magnesium oxide (MAG-OX) tablet 200 mg  200 mg Oral BID Pucilowska, Jolanta B, MD   200 mg at 10/06/17 1800  . midazolam (VERSED) injection 2 mg  2 mg Intravenous Once Clapacs, John T, MD      . multivitamin with minerals tablet 1 tablet  1 tablet Oral Daily Pucilowska, Jolanta B, MD   1 tablet at 10/06/17 1018  . omega-3 acid ethyl esters (LOVAZA) capsule 1 g  1 g Oral BID Pucilowska, Jolanta B, MD   1 g at 10/06/17 1800  . ondansetron (ZOFRAN) injection 4 mg  4 mg Intravenous Once Clapacs, John T, MD      . ondansetron [facility]) injection 4 mg  4 mg Intravenous Once PRN Gunnar Fusi, MD      . ondansetron [facility]) injection 4 mg  4 mg Intravenous Once PRN Gunnar Bulla, MD      . ondansetron (ZOFRAN-ODT) disintegrating tablet 4 mg  4 mg Oral Q8H PRN Pucilowska, Jolanta B, MD   4 mg at 09/27/17 1605  . pantoprazole (PROTONIX) EC tablet 40 mg  40 mg Oral Daily Pucilowska, Jolanta B, MD   40 mg at 10/07/17 0017  . PARoxetine (PAXIL-CR) 24 hr tablet 25 mg  25 mg Oral Daily Pucilowska, Jolanta B, MD   25 mg at 10/06/17 1019  . polyvinyl alcohol (LIQUIFILM TEARS) 1.4 % ophthalmic solution 1 drop  1 drop Both Eyes PRN Ramond Dial, MD   1 drop  at 10/07/17 0059  . prazosin (MINIPRESS) capsule 2 mg  2 mg Oral BID Pucilowska, Jolanta B, MD   2 mg at 10/06/17 1800  . pyridOXINE (VITAMIN B-6) tablet 100 mg  100 mg Oral BID Clapacs, John T, MD   100 mg at 10/06/17 1800  . tiZANidine (ZANAFLEX) tablet 2 mg  2 mg Oral Q8H PRN Pucilowska, Jolanta B, MD   2 mg at 10/02/17 0106  . traZODone (DESYREL) tablet 50 mg  50 mg Oral QHS  Pucilowska, Jolanta B, MD   50 mg at 10/06/17 2109  . triamcinolone cream (KENALOG) 0.1 %   Topical TID PRN Ramond Dial, MD        Lab Results:  No results found for this or any previous visit (from the past 48 hour(s)).  Blood Alcohol level:  Lab Results  Component Value Date   ETH <5 09/29/2016   ETH <5 66/44/0347    Metabolic Disorder Labs: Lab Results  Component Value Date   HGBA1C 5.7 (H) 09/20/2017   MPG 116.89 09/20/2017   No results found for: PROLACTIN Lab Results  Component Value Date   CHOL 173 09/20/2017   TRIG 193 (H) 09/20/2017   HDL 50 09/20/2017   CHOLHDL 3.5 09/20/2017   VLDL 39 09/20/2017   LDLCALC 84 09/20/2017   LDLCALC 87 09/04/2017    Physical Findings: AIMS:  , ,  ,  ,    CIWA:    COWS:     Musculoskeletal: Strength & Muscle Tone: within normal limits Gait & Station: normal Patient leans: N/A  Psychiatric Specialty Exam: Physical Exam  Nursing note and vitals reviewed. Constitutional: She appears well-developed and well-nourished.  HENT:  Head: Normocephalic and atraumatic.  Eyes: Pupils are equal, round, and reactive to light. Conjunctivae are normal.  Neck: Normal range of motion.  Cardiovascular: Regular rhythm and normal heart sounds.  Respiratory: Effort normal. No respiratory distress.  GI: Soft.  Musculoskeletal: Normal range of motion.  Neurological: She is alert.  Skin: Skin is warm and dry.  Psychiatric: Her affect is not blunt. She is agitated. She is not aggressive. Thought content is not paranoid. She expresses impulsivity. She exhibits a depressed mood. She expresses no homicidal and no suicidal ideation. She exhibits abnormal recent memory.    Review of Systems  Constitutional: Negative.   HENT: Negative.   Eyes: Negative.   Respiratory: Negative.   Cardiovascular: Negative.   Gastrointestinal: Negative.   Musculoskeletal: Negative.   Skin: Negative.   Neurological: Negative.   Psychiatric/Behavioral:  Positive for depression and memory loss. Negative for hallucinations, substance abuse and suicidal ideas. The patient is nervous/anxious and has insomnia.     Blood pressure (!) 151/80, pulse (!) 55, temperature 98.6 F (37 C), temperature source Oral, resp. rate 16, height _0  (1.6 m), weight 60.8 kg, SpO2 99 %.Body mass index is 23.74 kg/m.  General Appearance: Casual  Eye Contact:  Fair  Speech:  Clear and Coherent  Volume:  Decreased  Mood:  Dysphoric  Affect:  Congruent  Thought Process:  Goal Directed  Orientation:  Full (Time, Place, and Person)  Thought Content:  Rumination and Tangential  Suicidal Thoughts:  No  Homicidal Thoughts:  No  Memory:  Immediate;   Fair Recent;   Fair Remote;   Fair  Judgement:  Impaired  Insight:  Shallow  Psychomotor Activity:  Decreased  Concentration:  Concentration: Poor  Recall:  [name]:  Fair  Language:  Fair  Assets:  Desire for Improvement  ADL's:  Intact  Cognition:  Impaired,  Mild  Sleep:  Number of Hours: 4.15     Treatment Plan Summary: Daily contact with patient to assess and evaluate symptoms and progress in treatment, Medication management and Plan ECT without difficulty.  Slept a little better last night.  As usual when Ellean Firman stopped to talk with her one-on-one she has a large number of complaints but left to her own devices she is less disorganized and more appropriate.  Treatment team met today and discussed discharge planning.  It will be difficult finding appropriate disposition for her so we will have to start looking for places.  Asked the patient to please try and be involved with that.  For now continue current medication.   Plan: # Bipolar Affective disorder -- continue ECT -- continue paxil 81m daily, watching for anticholinergic side effects.  -- no SI or HI, no AVH.    # PTSD: -- continue Prozac 247mBID.  -- continue Trazodone 509mhs.  -- recommend trauma therapy as outpatient    # Rash -- continue Triamcinolone cream.  -- no indication for Permethrin for scabies at this time.  Will continue monitor.   # HTN -- continue Norvasc 5mg41m BID, however pt's BP is still high with Systolic 150-806-386d diastolic around 80-185-488d her HR is around 55-60.   Consider hosptialist consult  Or outpatient PCP management for further adjusting her HTN.  -- defer the primary team for indication of whether to continue ECT.   #  Hypothyroidism -- continue Synthroid 100mc8mily and TSH is 0.95, WNL on 09/20/17   Dispo: -- defer to primary team.    Karn Derk, MD 10/07/2017, 10:55 AM

## 2017-10-07 NOTE — Progress Notes (Signed)
D: Pt denies SI/HI/AVH, contracts for safety. Pt is more pleasant and cooperative this evening during assessments. Pt. Is much less irritable and aggressive with staff this evening overall. Pt. Affect observed much brighter, smiles frequently on the unit. Pt. A bit animated and euphoric socializing as well as during assessment. Pt. Socializing this evening with her peers more appropriately, laughing and cheerful often. Pt. Does continue to report many different random somatic complaints, but is redirectable and able to be more positive overall. Pt. Continues to stay up late and resist sleeping during normal sleep hours. Pt. Reports during assessments anxiety, "4/10", but later requests PRN ativan for anxiety. Pt. Reports depression, "7/10".  Pt. Reports today was a better day overall.   A: Q x 15 minute observation checks were completed for safety. Patient was provided with education, but needs reinforcement.  Patient was given/offered medications per orders. Patient  was encourage to attend groups, participate in unit activities and continue with plan of care. Pt. Chart and plans of care reviewed. Pt. Given support and encouragement.   R: Patient is complaint with medication and unit procedures more overall. Pt. Continues to refuse front wheel walker often for safety, reporting she does not need it and doesn't want to use it. Pt. Otherwise has been walking with a steady gait. Pt. Denies physical pain this evening. Denies S/S of ECT. Pt. Reports, "my body just feels like it's rejuvenating".              Precautionary checks every 15 minutes for safety maintained, room free of safety hazards, patient sustains no injury or falls during this shift. Will endorse care to next shift.  Patient behavioral incident @0100   Pt. Up at the nursing station requesting for eye drops for dry eye demanding staff get up and help her immediately. Pt. Instructed that demanding behavior is inappropriate and needs to be  more polite with staff and not be sarcastic or belittling to staff. Pt. Redirected and encouraged to try to sleep after helping the patient with reported dry eyes, so that she is able to rest and sleep during appropriate sleep hours, so she can participate in unit activities more. Pt. Agitated by staff encouragements and support, but does return to room to try to sleep.

## 2017-10-07 NOTE — B[facility] Group Notes (Signed)
LCSW Group Therapy Note 10/07/2017 1:15pm  Type of Therapy and Topic: Group Therapy: Feelings Around Returning Home & Establishing a Supportive Framework and Supporting Oneself When Supports Not Available  Participation Level: Did Not Attend  Description of Group:  Patients first processed thoughts and feelings about upcoming discharge. These included fears of upcoming changes, lack of change, new living environments, judgements and expectations from others and overall stigma of mental health issues. The group then discussed the definition of a supportive framework, what that looks and feels like, and how do to discern it from an unhealthy non-supportive network. The group identified different types of supports as well as what to do when your family/friends are less than helpful or unavailable  Therapeutic Goals  1. Patient will identify one healthy supportive network that they can use at discharge. 2. Patient will identify one factor of a supportive framework and how to tell it from an unhealthy network. 3. Patient able to identify one coping skill to use when they do not have positive supports from others. 4. Patient will demonstrate ability to communicate their needs through discussion and/or role plays.  Summary of Patient Progress:  Pt was invited to attend group but chose not to attend. CSW will continue to encourage pt to attend group throughout their admission.   Therapeutic Modalities Cognitive Behavioral Therapy Motivational Interviewing   Angela [name]  [name], LCSW 10/07/2017 10:54 AM

## 2017-10-07 NOTE — Progress Notes (Signed)
Received Angela [name] this AM in her room asleep,she lept in until noon.She ate lunch then received her morning medications. She remained OOB in the milieu throughout the evening with several requests. No change in her status this PM.

## 2017-10-08 ENCOUNTER — Inpatient Hospital Stay: Payer: Medicare Other | Admitting: Anesthesiology

## 2017-10-08 LAB — GLUCOSE, CAPILLARY: Glucose-Capillary: 86 mg/dL (ref 70–99)

## 2017-10-08 MED ORDER — KETOROLAC TROMETHAMINE 30 MG/ML IJ SOLN
INTRAMUSCULAR | Status: AC
  Administered 2017-10-08: 30 mg via INTRAVENOUS
  Filled 2017-10-08: qty 1

## 2017-10-08 MED ORDER — SUCCINYLCHOLINE CHLORIDE 200 MG/10ML IV SOSY
PREFILLED_SYRINGE | INTRAVENOUS | Status: DC | PRN
  Administered 2017-10-08: 80 mg via INTRAVENOUS

## 2017-10-08 MED ORDER — KETOROLAC TROMETHAMINE 30 MG/ML IJ SOLN
30.0000 mg | Freq: Once | INTRAMUSCULAR | Status: AC
  Administered 2017-10-08: 30 mg via INTRAVENOUS

## 2017-10-08 MED ORDER — GLYCOPYRROLATE 0.2 MG/ML IJ SOLN
0.1000 mg | Freq: Once | INTRAMUSCULAR | Status: AC
  Administered 2017-10-08: 0.1 mg via INTRAVENOUS

## 2017-10-08 MED ORDER — FENTANYL CITRATE (PF) 100 MCG/2ML IJ SOLN: 25.0000 ug | INTRAMUSCULAR | Status: DC | PRN

## 2017-10-08 MED ORDER — ONDANSETRON HCL 4 MG/2ML IJ SOLN
INTRAMUSCULAR | Status: AC
  Administered 2017-10-08: 4 mg via INTRAVENOUS
  Filled 2017-10-08: qty 2

## 2017-10-08 MED ORDER — PROMETHAZINE HCL 25 MG PO TABS
12.5000 mg | ORAL_TABLET | Freq: Four times a day (QID) | ORAL | Status: DC | PRN
  Administered 2017-10-08 – 2017-10-24 (×3): 12.5 mg via ORAL
  Filled 2017-10-08 (×3): qty 1

## 2017-10-08 MED ORDER — SODIUM CHLORIDE 0.9 % IV SOLN
500.0000 mL | Freq: Once | INTRAVENOUS | Status: AC
  Administered 2017-10-08: 500 mL via INTRAVENOUS

## 2017-10-08 MED ORDER — GLYCOPYRROLATE 0.2 MG/ML IJ SOLN
INTRAMUSCULAR | Status: AC
  Administered 2017-10-08: 0.1 mg via INTRAVENOUS
  Filled 2017-10-08: qty 1

## 2017-10-08 MED ORDER — DOCUSATE SODIUM 100 MG PO CAPS
100.0000 mg | ORAL_CAPSULE | Freq: Two times a day (BID) | ORAL | Status: DC
  Administered 2017-10-08 – 2017-10-12 (×7): 100 mg via ORAL
  Filled 2017-10-08 (×11): qty 1

## 2017-10-08 MED ORDER — METHOHEXITAL SODIUM 100 MG/10ML IV SOSY
PREFILLED_SYRINGE | INTRAVENOUS | Status: DC | PRN
  Administered 2017-10-08: 80 mg via INTRAVENOUS

## 2017-10-08 MED ORDER — ONDANSETRON HCL 4 MG/2ML IJ SOLN: 4.0000 mg | Freq: Once | INTRAMUSCULAR | Status: DC | PRN

## 2017-10-08 MED ORDER — SODIUM CHLORIDE 0.9 % IV SOLN
INTRAVENOUS | Status: DC | PRN
  Administered 2017-10-08: 11:00:00 via INTRAVENOUS

## 2017-10-08 MED ORDER — SENNOSIDES-DOCUSATE SODIUM 8.6-50 MG PO TABS
1.0000 | ORAL_TABLET | Freq: Every day | ORAL | Status: AC
  Administered 2017-10-08: 1 via ORAL
  Filled 2017-10-08: qty 1

## 2017-10-08 MED ORDER — ONDANSETRON HCL 4 MG/2ML IJ SOLN
4.0000 mg | Freq: Once | INTRAMUSCULAR | Status: AC
  Administered 2017-10-08: 4 mg via INTRAVENOUS

## 2017-10-08 NOTE — Progress Notes (Signed)
D: Pt denies SI/HI/AVH, able to contract for safety. Pt. Able to be a little bit more positive this evening, but not as much as previous night. Pt. A bit more negative this evening and irritable. Pt. Partially compliant with medications, requiring frequent redirection and encouragement to comply. Pt. Walking steady, frequently continues to not utilize front wheel walker. Pt. Is more sarcastic, argumentative, and aggressive this evening with staff periodically. Mood is labile overall. Pt. Reports edema in her ankles that is not present. Assessment of ankles WDL. Pt. Continues to present with frequent somatic complaints as evidence by nursing assessments of WDL. Pt. Religiously preoccupied this evening frequently. Pt. Reports she feels, "I think I need a few more ECT treatments to really get Kaliya back". Pt. Difficult to redirect from obsessive moments when she fixates on a particular random topic.   A: Q x 15 minute observation checks were completed for safety. Patient was provided with education, needs reinforcement most likely for improved learning.  Patient was given/offered medications per orders. Patient  was encourage to attend groups, participate in unit activities and continue with plan of care. Pt. Chart and plans of care reviewed. Pt. Given support and encouragement.   R: Patient is complaint with medication and unit procedures with redirection and encouragement from staff. Pt. Blood sugars monitored pre ECT. Pt. NPO after midnight per orders. Pt. Chronic pain being managed per orders with good effect.             Precautionary checks every 15 minutes for safety maintained, room free of safety hazards, patient sustains no injury or falls during this shift. Will endorse care to next shift.

## 2017-10-08 NOTE — Progress Notes (Signed)
Recreation Therapy Notes  Date: 10/08/2017  Time: 9:30 am   Location: Craft room   Behavioral response: N/A   Intervention Topic: Problem Solving  Discussion/Intervention: Patient did not attend group.   Clinical Observations/Feedback:  Patient did not attend group.   Anjalina Bergevin LRT/CTRS        Kimberli Winne 10/08/2017 10:18 AM

## 2017-10-08 NOTE — Plan of Care (Signed)
Pt. Denies Si/hi, contracts for safety. Pt. Able to be a little bit more positive this evening, but not as much as previous night. Pt. A bit more negative this evening and irritable. Pt. Partially compliant with medications, requiring frequent redirection and encouragement to comply.   Problem: Medication: Goal: Compliance with prescribed medication regimen will improve Outcome: Not Progressing   Problem: Self-Concept: Goal: Ability to disclose and discuss suicidal ideas will improve Outcome: Progressing Goal: Will verbalize positive feelings about self Outcome: Progressing   Problem: Safety: Goal: Ability to remain free from injury will improve Outcome: Progressing

## 2017-10-08 NOTE — Anesthesia Post-op Follow-up Note (Signed)
Anesthesia QCDR form completed.        

## 2017-10-08 NOTE — Progress Notes (Signed)
Patient stated that she had an appointment at [facility], with Dr. Rolinda Roan, today and wanted this writer to call and let them know that she is in the hospital.

## 2017-10-08 NOTE — Progress Notes (Signed)
[facility] MD Progress Note  10/08/2017 7:38 PM Angela [name]  MRN:  [id number] Subjective: Follow-up for this patient with bipolar disorder and personality disorder and multiple medical complaints.  Patient had ECT this morning which at the time was well tolerated.  Seeing her prior to ECT the patient seem to be in a pretty good mood for her.  She was able to even acknowledge that her mood was much better.  Not nearly as agitated or overwhelmed as previously.  This evening however she is having somatic complaints.  Complaining now of abdominal pain and claiming that she is having nausea and vomiting although no vomiting has actually occurred.  Mostly seems to be just having pain in her belly that could be consistent with constipation.  Patient is not having current suicidal ideation. Principal Problem: Bipolar I disorder, most recent episode mixed, severe with psychotic features ([place]) Diagnosis:   Patient Active Problem List   Diagnosis Date Noted  . Bipolar I disorder, most recent episode mixed, severe with psychotic features ([place]) [F31.64] 09/18/2017  . Agitation [R45.1] 09/18/2017  . IBS (irritable bowel syndrome) [K58.9] 11/17/2016  . Bilateral carpal tunnel syndrome [G56.03] 11/17/2016  . Plantar fasciitis, bilateral [M72.2] 11/17/2016  . Chronic pain syndrome [G89.4] 11/17/2016  . Chronic hyponatremia [E87.1] 11/17/2016  . PTSD (post-traumatic stress disorder) [F43.10] 11/17/2016  . Hernia, hiatal [K44.9] 11/17/2016  . Vitamin D deficiency [E55.9] 11/17/2016  . Vitamin B12 deficiency [E53.8] 11/17/2016  . SIADH (syndrome of inappropriate ADH production) ([place]) [E22.2] 09/08/2016  . Cervical spondylosis with myelopathy [M47.12] 05/24/2016  . Post-concussion headache [G44.309] 05/24/2016  . MCI (mild cognitive impairment) with memory loss [G31.84] 03/30/2016  . Gait abnormality [R26.9] 03/30/2016  . Chronic bipolar affective disorder ([place]) [F31.9] 01/24/2016  . Anemia, iron deficiency  [D50.9] 06/24/2015  . Endometriosis [N80.9] 06/24/2015  . Essential hypertension [I10] 06/24/2015  . History of postmenopausal bleeding [Z87.42] 05/22/2015  . History of TIA (transient ischemic attack) [Z86.73] 04/20/2015  . Hypothyroidism [E03.9] 04/20/2015  . GERD (gastroesophageal reflux disease) [K21.9] 04/20/2015  . Cervical disc disorder with radiculopathy [M50.10] 03/25/2015  . Chronic neck pain [M54.2, G89.29] 03/25/2015  . Pelvic pain in female [R10.2] 10/30/2014  . History of skin cancer [Z85.828] 06/30/2014  . Chronic headaches [R51] 12/16/2013  . Neuropathy [G62.9] 12/16/2013   Total Time spent with patient: 30 minutes  Past Psychiatric History: Patient has a long history of mood instability and a lot of difficulty with being resistant to cooperation with medicine  Past Medical History:  Past Medical History:  Diagnosis Date  . Allergic rhinitis   . Anemia   . Blurred vision   . Depression   . Diverticulosis   . Endometriosis   . Falls   . GERD (gastroesophageal reflux disease)   . Hernia, hiatal   . Hypothyroidism   . IBS (irritable bowel syndrome)   . Malignant neoplasm of skin   . Migraine   . Neuropathy   . PTSD (post-traumatic stress disorder)   . Thyroid disease     Past Surgical History:  Procedure Laterality Date  . APPENDECTOMY    . CHOLECYSTECTOMY    . CHOLECYSTECTOMY, LAPAROSCOPIC    . ESOPHAGOGASTRODUODENOSCOPY (EGD) WITH PROPOFOL N/A 03/29/2015   Procedure: ESOPHAGOGASTRODUODENOSCOPY (EGD) WITH PROPOFOL;  Surgeon: Josefine Class, MD;  Location: [facility] ENDOSCOPY;  Service: Endoscopy;  Laterality: N/A;  . HERNIA REPAIR    . laproscopy    . TONSILLECTOMY    . uteral suspension     Family  History:  Family History  Problem Relation Age of Onset  . Heart disease Father   . Cancer Mother        lung  . Urolithiasis Neg Hx   . Kidney disease Neg Hx   . Kidney cancer Neg Hx   . Prostate cancer Neg Hx    Family Psychiatric  History: See  previous note Social History:  Social History   Substance and Sexual Activity  Alcohol Use No     Social History   Substance and Sexual Activity  Drug Use No    Social History   Socioeconomic History  . Marital status: Single    Spouse name: Not on file  . Number of children: 0  . Years of education: 1.5 years of college  . Highest education level: Not on file  Occupational History  . Occupation: Retired  Scientific laboratory technician  . Financial resource strain: Not on file  . Food insecurity:    Worry: Not on file    Inability: Not on file  . Transportation needs:    Medical: Not on file    Non-medical: Not on file  Tobacco Use  . Smoking status: Former Smoker    Last attempt to quit: 11/17/1976    Years since quitting: 40.9  . Smokeless tobacco: Never Used  Substance and Sexual Activity  . Alcohol use: No  . Drug use: No  . Sexual activity: Not Currently  Lifestyle  . Physical activity:    Days per week: Not on file    Minutes per session: Not on file  . Stress: Not on file  Relationships  . Social connections:    Talks on phone: Not on file    Gets together: Not on file    Attends religious service: Not on file    Active member of club or organization: Not on file    Attends meetings of clubs or organizations: Not on file    Relationship status: Not on file  Other Topics Concern  . Not on file  Social History Narrative   Lives at home alone.   Right-handed.   No daily caffeine use.   Additional Social History:    Pain Medications: See PTA Prescriptions: See PTA Over the Counter: See PTA History of alcohol / drug use?: No history of alcohol / drug abuse Longest period of sobriety (when/how long): Reports of none Negative Consequences of Use: (n/a) Withdrawal Symptoms: (n/a)                    Sleep: Fair  Appetite:  Fair  Current Medications: Current Facility-Administered Medications  Medication Dose Route Frequency Provider Last Rate Last Dose   . acetaminophen (TYLENOL) tablet 650 mg  650 mg Oral Q6H PRN Clapacs, Madie Reno, MD   650 mg at 10/04/17 2159  . albuterol (PROVENTIL) (2.5 MG/3ML) 0.083% nebulizer solution 2.5 mg  2.5 mg Inhalation Q6H PRN Clapacs, John T, MD      . alum & mag hydroxide-simeth (MAALOX/MYLANTA) 200-200-20 MG/5ML suspension 30 mL  30 mL Oral Q4H PRN Clapacs, Madie Reno, MD   30 mL at 10/08/17 1507  . amLODipine (NORVASC) tablet 5 mg  5 mg Oral BID Clapacs, Madie Reno, MD   5 mg at 10/08/17 1254  . calcium carbonate (TUMS - dosed in mg elemental calcium) chewable tablet 500 mg of elemental calcium  500 mg of elemental calcium Oral BID WC Pucilowska, Jolanta B, MD   500 mg of elemental calcium at  10/08/17 1246  . cholecalciferol (VITAMIN D) tablet 1,000 Units  1,000 Units Oral Daily Pucilowska, Jolanta B, MD   1,000 Units at 10/08/17 1253  . cycloSPORINE (RESTASIS) 0.05 % ophthalmic emulsion 1 drop  1 drop Both Eyes BID Clapacs, Madie Reno, MD   1 drop at 10/08/17 1247  . diclofenac sodium (VOLTAREN) 1 % transdermal gel 4 g  4 g Topical QID Pucilowska, Jolanta B, MD   4 g at 10/07/17 2240  . feeding supplement (BOOST / RESOURCE BREEZE) liquid 1 Container  1 Container Oral TID BM Clapacs, Madie Reno, MD   1 Container at 10/08/17 1301  . fentaNYL (SUBLIMAZE) injection 25 mcg  25 mcg Intravenous Q5 min PRN Gunnar Bulla, MD      . fentaNYL (SUBLIMAZE) injection 25 mcg  25 mcg Intravenous Q5 min PRN Alvin Critchley, MD      . ferrous sulfate tablet 325 mg  325 mg Oral Q breakfast Pucilowska, Jolanta B, MD   325 mg at 10/08/17 1252  . gabapentin (NEURONTIN) capsule 100 mg  100 mg Oral BID Clapacs, Madie Reno, MD   100 mg at 10/08/17 1257  . glycopyrrolate (ROBINUL) injection 0.1 mg  0.1 mg Intravenous Once Clapacs, John T, MD      . hydrocortisone cream 1 %   Topical BID Clapacs, John T, MD      . levothyroxine (SYNTHROID, LEVOTHROID) tablet 100 mcg  100 mcg Oral QAC breakfast Pucilowska, Jolanta B, MD   100 mcg at 10/08/17 1252  . lidocaine  (LIDODERM) 5 % 1 patch  1 patch Transdermal Q24H Pucilowska, Jolanta B, MD   1 patch at 10/01/17 1223  . linaclotide (LINZESS) capsule 290 mcg  290 mcg Oral QAC breakfast Pucilowska, Jolanta B, MD   290 mcg at 10/07/17 1027  . lip balm (BLISTEX) ointment   Topical PRN Clapacs, John T, MD      . LORazepam (ATIVAN) tablet 0.5 mg  0.5 mg Oral Q6H PRN Clapacs, Madie Reno, MD   0.5 mg at 10/07/17 2246  . magnesium hydroxide (MILK OF MAGNESIA) suspension 30 mL  30 mL Oral Daily PRN Clapacs, Madie Reno, MD   30 mL at 10/05/17 1731  . magnesium oxide (MAG-OX) tablet 200 mg  200 mg Oral BID Pucilowska, Jolanta B, MD   200 mg at 10/08/17 1247  . midazolam (VERSED) injection 2 mg  2 mg Intravenous Once Clapacs, John T, MD      . multivitamin with minerals tablet 1 tablet  1 tablet Oral Daily Pucilowska, Jolanta B, MD   1 tablet at 10/08/17 1252  . omega-3 acid ethyl esters (LOVAZA) capsule 1 g  1 g Oral BID Pucilowska, Jolanta B, MD   1 g at 10/08/17 1246  . ondansetron (ZOFRAN) injection 4 mg  4 mg Intravenous Once Clapacs, John T, MD      . ondansetron [facility]) injection 4 mg  4 mg Intravenous Once PRN Gunnar Fusi, MD      . ondansetron [facility]) injection 4 mg  4 mg Intravenous Once PRN Gunnar Bulla, MD      . ondansetron [facility]) injection 4 mg  4 mg Intravenous Once PRN Alvin Critchley, MD      . ondansetron (ZOFRAN-ODT) disintegrating tablet 4 mg  4 mg Oral Q8H PRN Pucilowska, Jolanta B, MD   4 mg at 10/08/17 1807  . pantoprazole (PROTONIX) EC tablet 40 mg  40 mg Oral Daily Pucilowska, Jolanta B, MD   40 mg at  10/08/17 1252  . PARoxetine (PAXIL-CR) 24 hr tablet 25 mg  25 mg Oral Daily Pucilowska, Jolanta B, MD   25 mg at 10/08/17 1246  . polyvinyl alcohol (LIQUIFILM TEARS) 1.4 % ophthalmic solution 1 drop  1 drop Both Eyes PRN Ramond Dial, MD   1 drop at 10/08/17 1247  . prazosin (MINIPRESS) capsule 2 mg  2 mg Oral BID Pucilowska, Jolanta B, MD   2 mg at 10/08/17 1253  . promethazine (PHENERGAN)  tablet 12.5 mg  12.5 mg Oral Q6H PRN Clapacs, John T, MD   12.5 mg at 10/08/17 1647  . pyridOXINE (VITAMIN B-6) tablet 100 mg  100 mg Oral BID Clapacs, Madie Reno, MD   100 mg at 10/08/17 1246  . tiZANidine (ZANAFLEX) tablet 2 mg  2 mg Oral Q8H PRN Pucilowska, Jolanta B, MD   2 mg at 10/02/17 0106  . traZODone (DESYREL) tablet 100 mg  100 mg Oral QHS PRN He, Jun, MD   100 mg at 10/07/17 2347  . traZODone (DESYREL) tablet 50 mg  50 mg Oral QHS Pucilowska, Jolanta B, MD   50 mg at 10/07/17 2240  . triamcinolone cream (KENALOG) 0.1 %   Topical TID PRN Ramond Dial, MD        Lab Results:  Results for orders placed or performed during the hospital encounter of 09/19/17 (from the past 48 hour(s))  Glucose, capillary     Status: None   Collection Time: 10/08/17  5:25 AM  Result Value Ref Range   Glucose-Capillary 86 70 - 99 mg/dL    Blood Alcohol level:  Lab Results  Component Value Date   ETH <5 09/29/2016   ETH <5 40/34/7425    Metabolic Disorder Labs: Lab Results  Component Value Date   HGBA1C 5.7 (H) 09/20/2017   MPG 116.89 09/20/2017   No results found for: PROLACTIN Lab Results  Component Value Date   CHOL 173 09/20/2017   TRIG 193 (H) 09/20/2017   HDL 50 09/20/2017   CHOLHDL 3.5 09/20/2017   VLDL 39 09/20/2017   LDLCALC 84 09/20/2017   LDLCALC 87 09/04/2017    Physical Findings: AIMS:  , ,  ,  ,    CIWA:    COWS:     Musculoskeletal: Strength & Muscle Tone: within normal limits Gait & Station: normal Patient leans: N/A  Psychiatric Specialty Exam: Physical Exam  Nursing note and vitals reviewed. Constitutional: She appears well-developed and well-nourished.  HENT:  Head: Normocephalic and atraumatic.  Eyes: Pupils are equal, round, and reactive to light. Conjunctivae are normal.  Neck: Normal range of motion.  Cardiovascular: Regular rhythm and normal heart sounds.  Respiratory: Effort normal. No respiratory distress.  GI: Soft. There is tenderness.  There is guarding.  Musculoskeletal: Normal range of motion.  Neurological: She is alert.  Skin: Skin is warm and dry.  Psychiatric: Her mood appears anxious. Her affect is labile. Her affect is not blunt. Her speech is tangential. She is agitated. She is not aggressive. Thought content is not paranoid. Cognition and memory are impaired. She expresses impulsivity. She expresses no homicidal and no suicidal ideation.    Review of Systems  Constitutional: Negative.   HENT: Negative.   Eyes: Negative.   Respiratory: Negative.   Cardiovascular: Negative.   Gastrointestinal: Negative.   Musculoskeletal: Negative.   Skin: Negative.   Neurological: Negative.   Psychiatric/Behavioral: Negative for depression, hallucinations, memory loss, substance abuse and suicidal ideas. The patient is nervous/anxious and has insomnia.  Blood pressure 122/65, pulse (!) 55, temperature 97.6 F (36.4 C), temperature source Oral, resp. rate 16, height 5\' 3"  (1.6 m), weight 60.3 kg, SpO2 98 %.Body mass index is 23.56 kg/m.  General Appearance: Disheveled  Eye Contact:  Fair  Speech:  Slow  Volume:  Decreased  Mood:  Dysphoric  Affect:  Depressed  Thought Process:  Coherent  Orientation:  Full (Time, Place, and Person)  Thought Content:  Logical  Suicidal Thoughts:  No  Homicidal Thoughts:  No  Memory:  Immediate;   Fair Recent;   Fair Remote;   Fair  Judgement:  Fair  Insight:  Shallow  Psychomotor Activity:  Normal  Concentration:  Concentration: Fair  Recall:  [name]:  Fair  Language:  Fair  Akathisia:  No  Handed:  Right  AIMS (if indicated):     Assets:  Desire for Improvement Physical Health Resilience  ADL's:  Intact  Cognition:  WNL  Sleep:  Number of Hours: 5.15     Treatment Plan Summary: Daily contact with patient to assess and evaluate symptoms and progress in treatment, Medication management and Plan Patient certainly seems to have improved.  I think she is  continuing to improve with subsequent ECT treatments and that it is worth continuing ECT while we still have access to her in the hospital.  I understand that her stay is being "declined" by the insurance provider.  Patient probably could be discharged to follow-up outpatient care except that she is utterly homeless and we have no prospect that I am aware of the finding any place that she can stay.  As far as I know the patient is literally not even welcome at the shelter.  Therefore treatment team will continue to try and work on discharge which I would be perfectly amenable to if we can get it done and in the meanwhile we will continue medication and treatment.  As far as her abdominal pain tonight I suspect that it is probably constipation.  Alethia Berthold, MD 10/08/2017, 7:38 PM

## 2017-10-08 NOTE — Progress Notes (Signed)
Patient had complaints of being nauseated and wanted something for relief. This Probation officer administered Phenergan and patient stated that she had no relief. This Probation officer administered Zofran to patient and is continuing to monitor patient. MD was notified about patient condition. Patient also refused her evening medications, and MD was notified.

## 2017-10-08 NOTE — Progress Notes (Deleted)
Psychiatric Initial Adult Assessment   Patient Identification: Eladia Frame MRN:  [id number] Date of Evaluation:  10/08/2017 Referral Source: *** Chief Complaint:   Visit Diagnosis: No diagnosis found.  History of Present Illness:  ***  Associated Signs/Symptoms: Depression Symptoms:  {DEPRESSION SYMPTOMS:20000} (Hypo) Manic Symptoms:  {BHH MANIC SYMPTOMS:22872} Anxiety Symptoms:  {BHH ANXIETY SYMPTOMS:22873} Psychotic Symptoms:  {BHH PSYCHOTIC SYMPTOMS:22874} PTSD Symptoms: {BHH PTSD SYMPTOMS:22875}  Past Psychiatric History: ***  Previous Psychotropic Medications: {YES/NO:21197}  Substance Abuse History in the last 12 months:  {yes no:314532}  Consequences of Substance Abuse: {BHH CONSEQUENCES OF SUBSTANCE ABUSE:22880}  Past Medical History:  Past Medical History:  Diagnosis Date  . Allergic rhinitis   . Anemia   . Blurred vision   . Depression   . Diverticulosis   . Endometriosis   . Falls   . GERD (gastroesophageal reflux disease)   . Hernia, hiatal   . Hypothyroidism   . IBS (irritable bowel syndrome)   . Malignant neoplasm of skin   . Migraine   . Neuropathy   . PTSD (post-traumatic stress disorder)   . Thyroid disease     Past Surgical History:  Procedure Laterality Date  . APPENDECTOMY    . CHOLECYSTECTOMY    . CHOLECYSTECTOMY, LAPAROSCOPIC    . ESOPHAGOGASTRODUODENOSCOPY (EGD) WITH PROPOFOL N/A 03/29/2015   Procedure: ESOPHAGOGASTRODUODENOSCOPY (EGD) WITH PROPOFOL;  Surgeon: Josefine Class, MD;  Location: [facility] ENDOSCOPY;  Service: Endoscopy;  Laterality: N/A;  . HERNIA REPAIR    . laproscopy    . TONSILLECTOMY    . uteral suspension      Family Psychiatric History: ***  Family History:  Family History  Problem Relation Age of Onset  . Heart disease Father   . Cancer Mother        lung  . Urolithiasis Neg Hx   . Kidney disease Neg Hx   . Kidney cancer Neg Hx   . Prostate cancer Neg Hx     Social History:   Social History    Socioeconomic History  . Marital status: Single    Spouse name: Not on file  . Number of children: 0  . Years of education: 1.5 years of college  . Highest education level: Not on file  Occupational History  . Occupation: Retired  Scientific laboratory technician  . Financial resource strain: Not on file  . Food insecurity:    Worry: Not on file    Inability: Not on file  . Transportation needs:    Medical: Not on file    Non-medical: Not on file  Tobacco Use  . Smoking status: Former Smoker    Last attempt to quit: 11/17/1976    Years since quitting: 40.9  . Smokeless tobacco: Never Used  Substance and Sexual Activity  . Alcohol use: No  . Drug use: No  . Sexual activity: Not Currently  Lifestyle  . Physical activity:    Days per week: Not on file    Minutes per session: Not on file  . Stress: Not on file  Relationships  . Social connections:    Talks on phone: Not on file    Gets together: Not on file    Attends religious service: Not on file    Active member of club or organization: Not on file    Attends meetings of clubs or organizations: Not on file    Relationship status: Not on file  Other Topics Concern  . Not on file  Social History Narrative  Lives at home alone.   Right-handed.   No daily caffeine use.    Additional Social History: ***  Allergies:   Allergies  Allergen Reactions  . Bee Venom Hives  . Darvon [Propoxyphene] Anaphylaxis and Hives    Hives/throat swelling   . Dicyclomine Itching  . Other Hives    Spider bites cause hives  . Oxycodone-Acetaminophen Nausea And Vomiting and Other (See Comments)    hallucinations   . Peanuts [Peanut Oil] Anaphylaxis and Hives  . Penicillins Hives    Has patient had a PCN reaction causing immediate rash, facial/tongue/throat swelling, SOB or lightheadedness with hypotension: Unknown Has patient had a PCN reaction causing severe rash involving mucus membranes or skin necrosis: Unknown Has patient had a PCN reaction  that required hospitalization: Unknown Has patient had a PCN reaction occurring within the last 10 years: Unknown If all of the above answers are "NO", then may proceed with Cephalosporin use.  . Sulfa Antibiotics Anaphylaxis, Hives and Itching  . Sulfacetamide Sodium Anaphylaxis, Hives and Itching  . Ultram [Tramadol] Hives  . Amikacin Nausea And Vomiting  . Aspirin Hives  . Doxycycline Photosensitivity  . Haloperidol Nausea And Vomiting  . Hymenoptera Venom Preparations Hives    Reaction to spider bites  . Imipramine Hives    Reaction to Tofranil  . Keflex [Cephalexin] Hives  . Lactose Intolerance (Gi)   . Sulfur Hives  . Adhesive [Tape] Hives and Rash  . Hydroxyzine Anxiety and Itching  . Prednisone Nausea And Vomiting, Anxiety and Other (See Comments)    Crazy mood swings, strange thoughts (pt does tolerate cortisone injections)    Metabolic Disorder Labs: Lab Results  Component Value Date   HGBA1C 5.7 (H) 09/20/2017   MPG 116.89 09/20/2017   No results found for: PROLACTIN Lab Results  Component Value Date   CHOL 173 09/20/2017   TRIG 193 (H) 09/20/2017   HDL 50 09/20/2017   CHOLHDL 3.5 09/20/2017   VLDL 39 09/20/2017   LDLCALC 84 09/20/2017   LDLCALC 87 09/04/2017     Current Medications: No current facility-administered medications for this visit.    No current outpatient medications on file.   Facility-Administered Medications Ordered in Other Visits  Medication Dose Route Frequency Provider Last Rate Last Dose  . acetaminophen (TYLENOL) tablet 650 mg  650 mg Oral Q6H PRN Clapacs, Madie Reno, MD   650 mg at 10/04/17 2159  . albuterol (PROVENTIL) (2.5 MG/3ML) 0.083% nebulizer solution 2.5 mg  2.5 mg Inhalation Q6H PRN Clapacs, John T, MD      . alum & mag hydroxide-simeth (MAALOX/MYLANTA) 200-200-20 MG/5ML suspension 30 mL  30 mL Oral Q4H PRN Clapacs, John T, MD   30 mL at 09/28/17 1416  . amLODipine (NORVASC) tablet 5 mg  5 mg Oral BID Clapacs, Madie Reno, MD   5  mg at 10/06/17 0803  . calcium carbonate (TUMS - dosed in mg elemental calcium) chewable tablet 500 mg of elemental calcium  500 mg of elemental calcium Oral BID WC Pucilowska, Jolanta B, MD   500 mg of elemental calcium at 10/07/17 1907  . cholecalciferol (VITAMIN D) tablet 1,000 Units  1,000 Units Oral Daily Pucilowska, Jolanta B, MD   1,000 Units at 10/07/17 1317  . cycloSPORINE (RESTASIS) 0.05 % ophthalmic emulsion 1 drop  1 drop Both Eyes BID Clapacs, Madie Reno, MD   1 drop at 10/07/17 1906  . diclofenac sodium (VOLTAREN) 1 % transdermal gel 4 g  4 g Topical QID  Pucilowska, Herma Ard B, MD   4 g at 10/07/17 2240  . feeding supplement (BOOST / RESOURCE BREEZE) liquid 1 Container  1 Container Oral TID BM Clapacs, Madie Reno, MD   1 Container at 10/07/17 2143  . fentaNYL (SUBLIMAZE) injection 25 mcg  25 mcg Intravenous Q5 min PRN Gunnar Bulla, MD      . ferrous sulfate tablet 325 mg  325 mg Oral Q breakfast Pucilowska, Jolanta B, MD   325 mg at 10/07/17 1318  . gabapentin (NEURONTIN) capsule 100 mg  100 mg Oral BID Clapacs, Madie Reno, MD   100 mg at 10/07/17 1908  . glycopyrrolate (ROBINUL) injection 0.1 mg  0.1 mg Intravenous Once Clapacs, John T, MD      . hydrocortisone cream 1 %   Topical BID Clapacs, John T, MD      . levothyroxine (SYNTHROID, LEVOTHROID) tablet 100 mcg  100 mcg Oral QAC breakfast Pucilowska, Jolanta B, MD   100 mcg at 10/07/17 4081  . lidocaine (LIDODERM) 5 % 1 patch  1 patch Transdermal Q24H Pucilowska, Jolanta B, MD   1 patch at 10/01/17 1223  . linaclotide (LINZESS) capsule 290 mcg  290 mcg Oral QAC breakfast Pucilowska, Jolanta B, MD   290 mcg at 10/07/17 4481  . lip balm (BLISTEX) ointment   Topical PRN Clapacs, John T, MD      . LORazepam (ATIVAN) tablet 0.5 mg  0.5 mg Oral Q6H PRN Clapacs, Madie Reno, MD   0.5 mg at 10/07/17 2246  . magnesium hydroxide (MILK OF MAGNESIA) suspension 30 mL  30 mL Oral Daily PRN Clapacs, Madie Reno, MD   30 mL at 10/05/17 1731  . magnesium oxide (MAG-OX)  tablet 200 mg  200 mg Oral BID Pucilowska, Jolanta B, MD   200 mg at 10/07/17 1907  . midazolam (VERSED) injection 2 mg  2 mg Intravenous Once Clapacs, John T, MD      . multivitamin with minerals tablet 1 tablet  1 tablet Oral Daily Pucilowska, Jolanta B, MD   1 tablet at 10/07/17 1318  . omega-3 acid ethyl esters (LOVAZA) capsule 1 g  1 g Oral BID Pucilowska, Jolanta B, MD   1 g at 10/07/17 1907  . ondansetron (ZOFRAN) injection 4 mg  4 mg Intravenous Once Clapacs, John T, MD      . ondansetron [facility]) injection 4 mg  4 mg Intravenous Once PRN Gunnar Fusi, MD      . ondansetron [facility]) injection 4 mg  4 mg Intravenous Once PRN Gunnar Bulla, MD      . ondansetron (ZOFRAN-ODT) disintegrating tablet 4 mg  4 mg Oral Q8H PRN Pucilowska, Jolanta B, MD   4 mg at 09/27/17 1605  . pantoprazole (PROTONIX) EC tablet 40 mg  40 mg Oral Daily Pucilowska, Jolanta B, MD   40 mg at 10/07/17 8563  . PARoxetine (PAXIL-CR) 24 hr tablet 25 mg  25 mg Oral Daily Pucilowska, Jolanta B, MD   25 mg at 10/07/17 1314  . polyvinyl alcohol (LIQUIFILM TEARS) 1.4 % ophthalmic solution 1 drop  1 drop Both Eyes PRN Ramond Dial, MD   1 drop at 10/07/17 1907  . prazosin (MINIPRESS) capsule 2 mg  2 mg Oral BID Pucilowska, Jolanta B, MD   2 mg at 10/07/17 1908  . pyridOXINE (VITAMIN B-6) tablet 100 mg  100 mg Oral BID Clapacs, Madie Reno, MD   100 mg at 10/07/17 1907  . tiZANidine (ZANAFLEX) tablet 2 mg  2 mg Oral Q8H PRN Pucilowska, Jolanta B, MD   2 mg at 10/02/17 0106  . traZODone (DESYREL) tablet 100 mg  100 mg Oral QHS PRN He, Jun, MD   100 mg at 10/07/17 2347  . traZODone (DESYREL) tablet 50 mg  50 mg Oral QHS Pucilowska, Jolanta B, MD   50 mg at 10/07/17 2240  . triamcinolone cream (KENALOG) 0.1 %   Topical TID PRN Ramond Dial, MD        Neurologic: Headache: {BHH YES OR NO:22294} Seizure: {BHH YES OR NO:22294} Paresthesias:{BHH YES OR ZH:08657}  Musculoskeletal: Strength & Muscle Tone: {desc;  muscle tone:32375} Gait & Station: {PE GAIT ED QION:62952} Patient leans: {Patient Leans:21022755}  Psychiatric Specialty Exam: ROS  There were no vitals taken for this visit.There is no height or weight on file to calculate BMI.  General Appearance: {Appearance:22683}  Eye Contact:  {BHH EYE CONTACT:22684}  Speech:  {Speech:22685}  Volume:  {Volume (PAA):22686}  Mood:  {BHH MOOD:22306}  Affect:  {Affect (PAA):22687}  Thought Process:  {Thought Process (PAA):22688}  Orientation:  {BHH ORIENTATION (PAA):22689}  Thought Content:  {Thought Content:22690}  Suicidal Thoughts:  {ST/HT (PAA):22692}  Homicidal Thoughts:  {ST/HT (PAA):22692}  Memory:  {BHH MEMORY:22881}  Judgement:  {Judgement (PAA):22694}  Insight:  {Insight (PAA):22695}  Psychomotor Activity:  {Psychomotor (PAA):22696}  Concentration:  {Concentration:21399}  Recall:  {BHH GOOD/FAIR/POOR:22877}  Fund of Knowledge:{BHH GOOD/FAIR/POOR:22877}  Language: {BHH GOOD/FAIR/POOR:22877}  Akathisia:  {BHH YES OR NO:22294}  Handed:  {Handed:22697}  AIMS (if indicated):  ***  Assets:  {Assets (PAA):22698}  ADL's:  {BHH WUX'L:24401}  Cognition: {chl bhh cognition:304700322}  Sleep:  ***    Treatment Plan Summary: {CHL AMB BH MD TX UUVO:5366440347}   Norman Clay, MD 9/23/201910:51 AM

## 2017-10-08 NOTE — Progress Notes (Signed)
Received Angela [name] this AM in her room asleep, she returned from ECT and ate lunch. She ws compliant with her medications, showered and took a nap. She was tearful related to missing her significant other. She feels anxious and depressed at intervals.

## 2017-10-08 NOTE — Anesthesia Preprocedure Evaluation (Signed)
Anesthesia Evaluation  Patient identified by MRN, date of birth, ID band Patient awake    Reviewed: Allergy & Precautions, NPO status , Patient's Chart, lab work & pertinent test results, reviewed documented beta blocker date and time   Airway Mallampati: II  TM Distance: >3 FB     Dental  (+) Chipped   Pulmonary former smoker,           Cardiovascular hypertension,      Neuro/Psych  Headaches, PSYCHIATRIC DISORDERS Anxiety Depression Bipolar Disorder TIA Neuromuscular disease    GI/Hepatic hiatal hernia, GERD  Controlled,  Endo/Other  Hypothyroidism   Renal/GU      Musculoskeletal   Abdominal   Peds  Hematology  (+) anemia ,   Anesthesia Other Findings IADH. Low sats 95%.  Reproductive/Obstetrics                             Anesthesia Physical  Anesthesia Plan  ASA: III  Anesthesia Plan: General   Post-op Pain Management:    Induction: Intravenous  PONV Risk Score and Plan:   Airway Management Planned:   Additional Equipment:   Intra-op Plan:   Post-operative Plan:   Informed Consent: I have reviewed the patients History and Physical, chart, labs and discussed the procedure including the risks, benefits and alternatives for the proposed anesthesia with the patient or authorized representative who has indicated his/her understanding and acceptance.     Plan Discussed with: CRNA  Anesthesia Plan Comments:         Anesthesia Quick Evaluation

## 2017-10-08 NOTE — Procedures (Signed)
ECT SERVICES Physician's Interval Evaluation & Treatment Note  Patient Identification: Angela [name] MRN:  [id number] Date of Evaluation:  10/08/2017 TX #: 5  MADRS:   MMSE:   P.E. Findings:  No change to physical exam  Psychiatric Interval Note:  Mood and behavior seem to be improved and staying consistent  Subjective:  Patient is a 67 y.o. female seen for evaluation for Electroconvulsive Therapy. He is feeling better  Treatment Summary:   [x]   Right Unilateral             []  Bilateral   % Energy : 0.3 ms 70%   Impedance: 1180 ohms  Seizure Energy Index: 5042 V squared  Postictal Suppression Index: 30%  Seizure Concordance Index: 96%  Medications  Pre Shock: Toradol 30 mg Zofran 4 mg Brevital 60 mg succinylcholine 80 mg  Post Shock:    Seizure Duration: 40 seconds EMG 59 seconds EEG   Comments: Continuing to show improvement  Lungs:  [x]   Clear to auscultation               []  Other:   Heart:    [x]   Regular rhythm             []  irregular rhythm    [x]   Previous H&P reviewed, patient examined and there are NO CHANGES                 []   Previous H&P reviewed, patient examined and there are changes noted.   Alethia Berthold, MD 9/23/201911:07 AM

## 2017-10-08 NOTE — Transfer of Care (Signed)
Immediate Anesthesia Transfer of Care Note  Patient: Angela [name]  Procedure(s) Performed: ECT TX  Patient Location: PACU  Anesthesia Type:General  Level of Consciousness: sedated  Airway & Oxygen Therapy: Patient Spontanous Breathing and Patient connected to face mask oxygen  Post-op Assessment: Report given to RN and Post -op Vital signs reviewed and stable  Post vital signs: Reviewed and stable  Last Vitals:  Vitals Value Taken Time  BP 151/75 10/08/2017 11:23 AM  Temp    Pulse 71 10/08/2017 11:26 AM  Resp 17 10/08/2017 11:26 AM  SpO2 100 % 10/08/2017 11:26 AM  Vitals shown include unvalidated device data.  Last Pain:  Vitals:   10/08/17 0933  TempSrc: Oral  PainSc:       Patients Stated Pain Goal: 5 (53/61/44 3154)  Complications: No apparent anesthesia complications

## 2017-10-08 NOTE — H&P (Signed)
Angela [name] is an 67 y.o. female.   Chief Complaint: Still having some complaints of being mildly depressed and anxious but not nearly as bad as previously not actively suicidal HPI: History of recurrent severe mood disorder with mixed manic and depressed states  Past Medical History:  Diagnosis Date  . Allergic rhinitis   . Anemia   . Blurred vision   . Depression   . Diverticulosis   . Endometriosis   . Falls   . GERD (gastroesophageal reflux disease)   . Hernia, hiatal   . Hypothyroidism   . IBS (irritable bowel syndrome)   . Malignant neoplasm of skin   . Migraine   . Neuropathy   . PTSD (post-traumatic stress disorder)   . Thyroid disease     Past Surgical History:  Procedure Laterality Date  . APPENDECTOMY    . CHOLECYSTECTOMY    . CHOLECYSTECTOMY, LAPAROSCOPIC    . ESOPHAGOGASTRODUODENOSCOPY (EGD) WITH PROPOFOL N/A 03/29/2015   Procedure: ESOPHAGOGASTRODUODENOSCOPY (EGD) WITH PROPOFOL;  Surgeon: Josefine Class, MD;  Location: [facility] ENDOSCOPY;  Service: Endoscopy;  Laterality: N/A;  . HERNIA REPAIR    . laproscopy    . TONSILLECTOMY    . uteral suspension      Family History  Problem Relation Age of Onset  . Heart disease Father   . Cancer Mother        lung  . Urolithiasis Neg Hx   . Kidney disease Neg Hx   . Kidney cancer Neg Hx   . Prostate cancer Neg Hx    Social History:  reports that she quit smoking about 40 years ago. She has never used smokeless tobacco. She reports that she does not drink alcohol or use drugs.  Allergies:  Allergies  Allergen Reactions  . Bee Venom Hives  . Darvon [Propoxyphene] Anaphylaxis and Hives    Hives/throat swelling   . Dicyclomine Itching  . Other Hives    Spider bites cause hives  . Oxycodone-Acetaminophen Nausea And Vomiting and Other (See Comments)    hallucinations   . Peanuts [Peanut Oil] Anaphylaxis and Hives  . Penicillins Hives    Has patient had a PCN reaction causing immediate rash,  facial/tongue/throat swelling, SOB or lightheadedness with hypotension: Unknown Has patient had a PCN reaction causing severe rash involving mucus membranes or skin necrosis: Unknown Has patient had a PCN reaction that required hospitalization: Unknown Has patient had a PCN reaction occurring within the last 10 years: Unknown If all of the above answers are "NO", then may proceed with Cephalosporin use.  . Sulfa Antibiotics Anaphylaxis, Hives and Itching  . Sulfacetamide Sodium Anaphylaxis, Hives and Itching  . Ultram [Tramadol] Hives  . Amikacin Nausea And Vomiting  . Aspirin Hives  . Doxycycline Photosensitivity  . Haloperidol Nausea And Vomiting  . Hymenoptera Venom Preparations Hives    Reaction to spider bites  . Imipramine Hives    Reaction to Tofranil  . Keflex [Cephalexin] Hives  . Lactose Intolerance (Gi)   . Sulfur Hives  . Adhesive [Tape] Hives and Rash  . Hydroxyzine Anxiety and Itching  . Prednisone Nausea And Vomiting, Anxiety and Other (See Comments)    Crazy mood swings, strange thoughts (pt does tolerate cortisone injections)    Medications Prior to Admission  Medication Sig Dispense Refill  . amLODipine (NORVASC) 5 MG tablet Take 1 tablet (5 mg total) by mouth 2 (two) times daily. 180 tablet 1  . ANORO ELLIPTA 62.5-25 MCG/INH AEPB Inhale 1 puff into  the lungs daily. 14 each 0  . calcium carbonate (OS-CAL) 600 MG TABS tablet Take 1 tablet (600 mg total) by mouth 2 (two) times daily with a meal. (Patient taking differently: Take 600 mg 3 (three) times daily by mouth. ) 60 tablet 0  . cholecalciferol (VITAMIN D) 1000 units tablet Take 4,000 Units by mouth daily.    . citalopram (CELEXA) 20 MG tablet Take 20 mg by mouth daily.  0  . conjugated estrogens (PREMARIN) vaginal cream Place vaginally 3 (three) times a week. Use 1/2 gram 3-4 times weekly. 42.5 g 4  . cycloSPORINE (RESTASIS) 0.05 % ophthalmic emulsion Place 1 drop into both eyes 2 (two) times daily. 60 each 5   . diclofenac sodium (VOLTAREN) 1 % GEL Apply 2 g topically 4 (four) times daily.    . ferrous sulfate (FERROUSUL) 325 (65 FE) MG tablet Take 1 tablet (325 mg total) by mouth daily with breakfast. 90 tablet 1  . gabapentin (NEURONTIN) 100 MG capsule Take 1-2 capsules (100-200 mg total) by mouth 3 (three) times daily. (Patient taking differently: Take 100 mg by mouth 2 (two) times daily. ) 540 capsule 1  . guaiFENesin (MUCINEX) 600 MG 12 hr tablet Take 1 tablet (600 mg total) by mouth 2 (two) times daily as needed for cough. (Patient taking differently: Take 600 mg by mouth daily. ) 30 tablet 0  . hydrocortisone (PROCTOZONE-HC) 2.5 % rectal cream Place 1 application rectally 2 (two) times daily. 30 g 1  . hydrOXYzine (ATARAX/VISTARIL) 10 MG tablet Take 1 tablet by mouth 2 (two) times daily as needed for anxiety.     Marland Kitchen KRILL OIL OMEGA-3 PO Take 350 mg by mouth daily.    Marland Kitchen levalbuterol (XOPENEX) 1.25 MG/3ML nebulizer solution Inhale 3 mLs into the lungs daily.    Marland Kitchen levothyroxine (SYNTHROID, LEVOTHROID) 100 MCG tablet Take 1 tablet (100 mcg total) by mouth daily before breakfast. 90 tablet 1  . linaclotide (LINZESS) 290 MCG CAPS capsule Take 1 capsule (290 mcg total) by mouth daily before breakfast. 90 capsule 1  . magnesium oxide (MAG-OX) 400 MG tablet Take 400 mg by mouth daily with breakfast.    . montelukast (SINGULAIR) 10 MG tablet Take 1 tablet by mouth at bedtime.  11  . omeprazole (PRILOSEC) 20 MG capsule Take 1 capsule by mouth daily.    Vladimir Faster Glycol-Propyl Glycol (SYSTANE OP) Place 1 drop into both eyes daily as needed (dry eyes).    . polyethylene glycol powder (GLYCOLAX/MIRALAX) powder Take 17-34 g by mouth daily. 1700 g 5  . Probiotic Product (PROBIOTIC PO) Take 1 tablet by mouth daily.    . promethazine (PHENERGAN) 25 MG tablet Take 0.5 tablets (12.5 mg total) by mouth every 6 (six) hours as needed for nausea or vomiting. 30 tablet 0  . Pyridoxine HCl (VITAMIN B-6) 500 MG tablet  Take 1 tablet by mouth 2 (two) times daily.    . ranitidine (ZANTAC) 150 MG tablet Take 150 mg by mouth daily before supper.    Marland Kitchen tiZANidine (ZANAFLEX) 2 MG tablet Take 0.5-1 tablets (1-2 mg total) by mouth 2 (two) times daily as needed for muscle spasms. 180 tablet 1  . tolterodine (DETROL LA) 2 MG 24 hr capsule Take 1 capsule (2 mg total) daily by mouth. 30 capsule 11  . traZODone (DESYREL) 50 MG tablet Take 50 mg by mouth at bedtime.      Results for orders placed or performed during the hospital encounter of 09/19/17 (from  the past 48 hour(s))  Glucose, capillary     Status: None   Collection Time: 10/08/17  5:25 AM  Result Value Ref Range   Glucose-Capillary 86 70 - 99 mg/dL   No results found.  Review of Systems  Constitutional: Negative.   HENT: Negative.   Eyes: Negative.   Respiratory: Negative.   Cardiovascular: Negative.   Gastrointestinal: Negative.   Musculoskeletal: Negative.   Skin: Negative.   Neurological: Negative.   Psychiatric/Behavioral: Negative for depression, hallucinations, memory loss, substance abuse and suicidal ideas. The patient is nervous/anxious and has insomnia.     Blood pressure 134/70, pulse (!) 53, temperature (!) 97.4 F (36.3 C), temperature source Oral, resp. rate 14, height 5\' 3"  (1.6 m), weight 60.3 kg, SpO2 95 %. Physical Exam  Nursing note and vitals reviewed. Constitutional: She appears well-developed and well-nourished.  HENT:  Head: Normocephalic and atraumatic.  Eyes: Pupils are equal, round, and reactive to light. Conjunctivae are normal.  Neck: Normal range of motion.  Cardiovascular: Regular rhythm and normal heart sounds.  Respiratory: Effort normal. No respiratory distress.  GI: Soft.  Musculoskeletal: Normal range of motion.  Neurological: She is alert.  Skin: Skin is warm and dry.  Psychiatric: She has a normal mood and affect. Her behavior is normal. Judgment and thought content normal.     Assessment/Plan Showing  some improvement returning to what might be as good a baseline as we are going to get but still improving so it is worthwhile to continue treatment  Alethia Berthold, MD 10/08/2017, 11:04 AM

## 2017-10-08 NOTE — BHH Group Notes (Signed)
[facility] Notes:  (Nursing/MHT/Case Management/Adjunct)  Date:  10/08/2017  Time:  9:20 PM  Type of Therapy:  Group Therapy  Participation Level:  Did Not Attend   Angela [name] 10/08/2017, 9:20 PM

## 2017-10-09 NOTE — Anesthesia Postprocedure Evaluation (Signed)
Anesthesia Post Note  Patient: Angela [name]  Procedure(s) Performed: ECT TX  Patient location during evaluation: PACU Anesthesia Type: General Level of consciousness: awake and alert and oriented Pain management: pain level controlled Vital Signs Assessment: post-procedure vital signs reviewed and stable Respiratory status: spontaneous breathing Cardiovascular status: blood pressure returned to baseline Anesthetic complications: no     Last Vitals:  Vitals:   10/09/17 1136 10/09/17 1153  BP: (!) 81/52 (!) 92/50  Pulse: 67   Resp: 18   Temp: 36.8 C   SpO2: 100%     Last Pain:  Vitals:   10/09/17 1136  TempSrc: Oral  PainSc:                  Angela [name]

## 2017-10-09 NOTE — Progress Notes (Signed)
Recreation Therapy Notes  Date: 10/09/2017  Time: 9:30 pm   Location: Craft Room   Behavioral response: N/A   Intervention Topic: Emotions  Discussion/Intervention: Patient did not attend group.   Clinical Observations/Feedback:  Patient did not attend group.   Lisa-Marie Rueger LRT/CTRS         Angela [name] 10/09/2017 11:29 AM

## 2017-10-09 NOTE — [facility] Group Notes (Signed)
[place] LCSW Group Therapy Note  Date/Time: 10/09/17, 1300  Type of Therapy/Topic:  Group Therapy:  Feelings about Diagnosis  Participation Level:  Did Not Attend   Mood:   Description of Group:    This group will allow patients to explore their thoughts and feelings about diagnoses they have received. Patients will be guided to explore their level of understanding and acceptance of these diagnoses. Facilitator will encourage patients to process their thoughts and feelings about the reactions of others to their diagnosis, and will guide patients in identifying ways to discuss their diagnosis with significant others in their lives. This group will be process-oriented, with patients participating in exploration of their own experiences as well as giving and receiving support and challenge from other group members.   Therapeutic Goals: 1. Patient will demonstrate understanding of diagnosis as evidence by identifying two or more symptoms of the disorder:  2. Patient will be able to express two feelings regarding the diagnosis 3. Patient will demonstrate ability to communicate their needs through discussion and/or role plays  Summary of Patient Progress:        Therapeutic Modalities:   Cognitive Behavioral Therapy Brief Therapy Feelings Identification   Lurline Idol, LCSW

## 2017-10-09 NOTE — Progress Notes (Addendum)
CSW spoke to [place] at at [facility] on [address] in [place]. [phone]. No one bedrooms have even been given a notice on--which means it would be 30 days from when notice is given before one would be available.  3 two bedroom units are available Friday--rate is $725.  (one bedroom rate $580) Winferd Humphrey, MSW, LCSW Clinical Social Worker 10/09/2017 9:24 AM   CSW spoke with pt about this.  Pt had no recollection of this apartment complex and said that she is working with Darleene Cleaver from [place] and the [facility].  CSW LM with Darleene Cleaver (at [employer] number).    Clayborne Dana then came to [place] office and had spoken with pt, who is now agreeable to [place] locating an assisted living facility to discharge to and then she will work with the [facility] to move towards independent living. Winferd Humphrey, MSW, LCSW Clinical Social Worker 10/09/2017 12:39 PM   CSW spoke to Darleene Cleaver, [facility], [place]. [phone].  He has long history with pt.  Pt has never agreed to live alone in the past but now indicates she will.  6-9 month process--pt would need to go to an adult group home and then he could reopen her case with plan to move towards independent living situation. Winferd Humphrey, MSW, LCSW Clinical Social Worker 10/09/2017 3:01 PM

## 2017-10-09 NOTE — Progress Notes (Signed)
D: Patient stated slept poor  last night .Stated appetite is fair and energy level  Is normal. Stated concentration is good . Stated on Depression scale 3 , hopeless 0 and anxiety 6.( low 0-10 high) Voice an Denies suicidal  homicidal ideations .  No auditory hallucinations  No pain concerns . Appropriate ADL'S. Interacting with peers and staff. Verbalize understanding of information received  concerning medications Patient  aware of information received from  staff referring to  education about unit programing .  Able to verbalize understanding. No Attending unit programing ,  Voice no concerns around  sleep.  Voice no safety concerns .  Emotional and mental health status improving  Voice of incontinent of stool  This  Am . Noted to request  Listing of  [facility] in [place] . Stated she was not going to go back to a group home . Voice of being  Upset  about her deceased boyfriend  And family not allowing her to  Come to their  [employer] . Patient up doing personal chores .  Returned to room  Voicing she dosen't feel well . Stated she don't want lunch   A: Encourage patient participation with unit programming . Instruction  Given on  Medication , verbalize understanding. R: Voice no other concerns. Staff continue to monitor

## 2017-10-09 NOTE — Progress Notes (Signed)
[facility] MD Progress Note  10/09/2017 5:34 PM Angela [name]  MRN:  [id number] Subjective: Follow-up today for 67 year old woman with bipolar disorder.  She admits to me not for the first time that her mood is definitely better after getting ECT which I think everyone can see.  She is no longer incredibly labile depressed crying and hopeless.  Definitely having some memory problems.  She repeats a lot of the same phrases to me that she says every day but it is all fairly harmless and she is certainly not delirious.  After her episode last night of profound abdominal pain it sounds like she had a bowel movement last night.  Perhaps not quite in the way that she would have preferred but at least she is no longer having terrible abdominal pain and she looks a lot more comfortable. Principal Problem: Bipolar I disorder, most recent episode mixed, severe with psychotic features ([place]) Diagnosis:   Patient Active Problem List   Diagnosis Date Noted  . Bipolar I disorder, most recent episode mixed, severe with psychotic features ([place]) [F31.64] 09/18/2017  . Agitation [R45.1] 09/18/2017  . IBS (irritable bowel syndrome) [K58.9] 11/17/2016  . Bilateral carpal tunnel syndrome [G56.03] 11/17/2016  . Plantar fasciitis, bilateral [M72.2] 11/17/2016  . Chronic pain syndrome [G89.4] 11/17/2016  . Chronic hyponatremia [E87.1] 11/17/2016  . PTSD (post-traumatic stress disorder) [F43.10] 11/17/2016  . Hernia, hiatal [K44.9] 11/17/2016  . Vitamin D deficiency [E55.9] 11/17/2016  . Vitamin B12 deficiency [E53.8] 11/17/2016  . SIADH (syndrome of inappropriate ADH production) ([place]) [E22.2] 09/08/2016  . Cervical spondylosis with myelopathy [M47.12] 05/24/2016  . Post-concussion headache [G44.309] 05/24/2016  . MCI (mild cognitive impairment) with memory loss [G31.84] 03/30/2016  . Gait abnormality [R26.9] 03/30/2016  . Chronic bipolar affective disorder ([place]) [F31.9] 01/24/2016  . Anemia, iron deficiency [D50.9]  06/24/2015  . Endometriosis [N80.9] 06/24/2015  . Essential hypertension [I10] 06/24/2015  . History of postmenopausal bleeding [Z87.42] 05/22/2015  . History of TIA (transient ischemic attack) [Z86.73] 04/20/2015  . Hypothyroidism [E03.9] 04/20/2015  . GERD (gastroesophageal reflux disease) [K21.9] 04/20/2015  . Cervical disc disorder with radiculopathy [M50.10] 03/25/2015  . Chronic neck pain [M54.2, G89.29] 03/25/2015  . Pelvic pain in female [R10.2] 10/30/2014  . History of skin cancer [Z85.828] 06/30/2014  . Chronic headaches [R51] 12/16/2013  . Neuropathy [G62.9] 12/16/2013   Total Time spent with patient: 20 minutes  Past Psychiatric History: Long-standing history of mood disorder  Past Medical History:  Past Medical History:  Diagnosis Date  . Allergic rhinitis   . Anemia   . Blurred vision   . Depression   . Diverticulosis   . Endometriosis   . Falls   . GERD (gastroesophageal reflux disease)   . Hernia, hiatal   . Hypothyroidism   . IBS (irritable bowel syndrome)   . Malignant neoplasm of skin   . Migraine   . Neuropathy   . PTSD (post-traumatic stress disorder)   . Thyroid disease     Past Surgical History:  Procedure Laterality Date  . APPENDECTOMY    . CHOLECYSTECTOMY    . CHOLECYSTECTOMY, LAPAROSCOPIC    . ESOPHAGOGASTRODUODENOSCOPY (EGD) WITH PROPOFOL N/A 03/29/2015   Procedure: ESOPHAGOGASTRODUODENOSCOPY (EGD) WITH PROPOFOL;  Surgeon: Josefine Class, MD;  Location: [facility] ENDOSCOPY;  Service: Endoscopy;  Laterality: N/A;  . HERNIA REPAIR    . laproscopy    . TONSILLECTOMY    . uteral suspension     Family History:  Family History  Problem Relation Age of  Onset  . Heart disease Father   . Cancer Mother        lung  . Urolithiasis Neg Hx   . Kidney disease Neg Hx   . Kidney cancer Neg Hx   . Prostate cancer Neg Hx    Family Psychiatric  History: See previous note Social History:  Social History   Substance and Sexual Activity  Alcohol  Use No     Social History   Substance and Sexual Activity  Drug Use No    Social History   Socioeconomic History  . Marital status: Single    Spouse name: Not on file  . Number of children: 0  . Years of education: 1.5 years of college  . Highest education level: Not on file  Occupational History  . Occupation: Retired  Scientific laboratory technician  . Financial resource strain: Not on file  . Food insecurity:    Worry: Not on file    Inability: Not on file  . Transportation needs:    Medical: Not on file    Non-medical: Not on file  Tobacco Use  . Smoking status: Former Smoker    Last attempt to quit: 11/17/1976    Years since quitting: 40.9  . Smokeless tobacco: Never Used  Substance and Sexual Activity  . Alcohol use: No  . Drug use: No  . Sexual activity: Not Currently  Lifestyle  . Physical activity:    Days per week: Not on file    Minutes per session: Not on file  . Stress: Not on file  Relationships  . Social connections:    Talks on phone: Not on file    Gets together: Not on file    Attends religious service: Not on file    Active member of club or organization: Not on file    Attends meetings of clubs or organizations: Not on file    Relationship status: Not on file  Other Topics Concern  . Not on file  Social History Narrative   Lives at home alone.   Right-handed.   No daily caffeine use.   Additional Social History:    Pain Medications: See PTA Prescriptions: See PTA Over the Counter: See PTA History of alcohol / drug use?: No history of alcohol / drug abuse Longest period of sobriety (when/how long): Reports of none Negative Consequences of Use: (n/a) Withdrawal Symptoms: (n/a)                    Sleep: Fair  Appetite:  Fair  Current Medications: Current Facility-Administered Medications  Medication Dose Route Frequency Provider Last Rate Last Dose  . acetaminophen (TYLENOL) tablet 650 mg  650 mg Oral Q6H PRN Clapacs, Madie Reno, MD   650 mg at  10/08/17 2103  . albuterol (PROVENTIL) (2.5 MG/3ML) 0.083% nebulizer solution 2.5 mg  2.5 mg Inhalation Q6H PRN Clapacs, John T, MD      . alum & mag hydroxide-simeth (MAALOX/MYLANTA) 200-200-20 MG/5ML suspension 30 mL  30 mL Oral Q4H PRN Clapacs, Madie Reno, MD   30 mL at 10/08/17 1507  . amLODipine (NORVASC) tablet 5 mg  5 mg Oral BID Clapacs, Madie Reno, MD   5 mg at 10/08/17 1254  . calcium carbonate (TUMS - dosed in mg elemental calcium) chewable tablet 500 mg of elemental calcium  500 mg of elemental calcium Oral BID WC Pucilowska, Jolanta B, MD   500 mg of elemental calcium at 10/09/17 1647  . cholecalciferol (VITAMIN D) tablet 1,000  Units  1,000 Units Oral Daily Pucilowska, Jolanta B, MD   1,000 Units at 10/09/17 0927  . cycloSPORINE (RESTASIS) 0.05 % ophthalmic emulsion 1 drop  1 drop Both Eyes BID Clapacs, Madie Reno, MD   1 drop at 10/09/17 1634  . diclofenac sodium (VOLTAREN) 1 % transdermal gel 4 g  4 g Topical QID Pucilowska, Jolanta B, MD   4 g at 10/09/17 1634  . docusate sodium (COLACE) capsule 100 mg  100 mg Oral BID Clapacs, Madie Reno, MD   100 mg at 10/09/17 1643  . feeding supplement (BOOST / RESOURCE BREEZE) liquid 1 Container  1 Container Oral TID BM Clapacs, Madie Reno, MD   1 Container at 10/09/17 1400  . fentaNYL (SUBLIMAZE) injection 25 mcg  25 mcg Intravenous Q5 min PRN Gunnar Bulla, MD      . fentaNYL (SUBLIMAZE) injection 25 mcg  25 mcg Intravenous Q5 min PRN Alvin Critchley, MD      . ferrous sulfate tablet 325 mg  325 mg Oral Q breakfast Pucilowska, Jolanta B, MD   325 mg at 10/09/17 0926  . gabapentin (NEURONTIN) capsule 100 mg  100 mg Oral BID Clapacs, Madie Reno, MD   100 mg at 10/09/17 1643  . glycopyrrolate (ROBINUL) injection 0.1 mg  0.1 mg Intravenous Once Clapacs, John T, MD      . hydrocortisone cream 1 %   Topical BID Clapacs, John T, MD      . levothyroxine (SYNTHROID, LEVOTHROID) tablet 100 mcg  100 mcg Oral QAC breakfast Pucilowska, Jolanta B, MD   100 mcg at 10/09/17 0646  .  lidocaine (LIDODERM) 5 % 1 patch  1 patch Transdermal Q24H Pucilowska, Jolanta B, MD   1 patch at 10/09/17 0932  . linaclotide (LINZESS) capsule 290 mcg  290 mcg Oral QAC breakfast Pucilowska, Jolanta B, MD   290 mcg at 10/09/17 0645  . lip balm (BLISTEX) ointment   Topical PRN Clapacs, John T, MD      . LORazepam (ATIVAN) tablet 0.5 mg  0.5 mg Oral Q6H PRN Clapacs, Madie Reno, MD   0.5 mg at 10/08/17 2102  . magnesium hydroxide (MILK OF MAGNESIA) suspension 30 mL  30 mL Oral Daily PRN Clapacs, Madie Reno, MD   30 mL at 10/05/17 1731  . magnesium oxide (MAG-OX) tablet 200 mg  200 mg Oral BID Pucilowska, Jolanta B, MD   200 mg at 10/09/17 1644  . midazolam (VERSED) injection 2 mg  2 mg Intravenous Once Clapacs, John T, MD      . multivitamin with minerals tablet 1 tablet  1 tablet Oral Daily Pucilowska, Jolanta B, MD   1 tablet at 10/09/17 0927  . omega-3 acid ethyl esters (LOVAZA) capsule 1 g  1 g Oral BID Pucilowska, Jolanta B, MD   1 g at 10/09/17 1645  . ondansetron (ZOFRAN) injection 4 mg  4 mg Intravenous Once Clapacs, John T, MD      . ondansetron [facility]) injection 4 mg  4 mg Intravenous Once PRN Gunnar Fusi, MD      . ondansetron [facility]) injection 4 mg  4 mg Intravenous Once PRN Gunnar Bulla, MD      . ondansetron [facility]) injection 4 mg  4 mg Intravenous Once PRN Alvin Critchley, MD      . ondansetron (ZOFRAN-ODT) disintegrating tablet 4 mg  4 mg Oral Q8H PRN Pucilowska, Jolanta B, MD   4 mg at 10/08/17 1807  . pantoprazole (PROTONIX) EC tablet 40  mg  40 mg Oral Daily Pucilowska, Jolanta B, MD   40 mg at 10/09/17 0645  . PARoxetine (PAXIL-CR) 24 hr tablet 25 mg  25 mg Oral Daily Pucilowska, Jolanta B, MD   25 mg at 10/09/17 0930  . polyvinyl alcohol (LIQUIFILM TEARS) 1.4 % ophthalmic solution 1 drop  1 drop Both Eyes PRN Ramond Dial, MD   1 drop at 10/08/17 1247  . prazosin (MINIPRESS) capsule 2 mg  2 mg Oral BID Pucilowska, Jolanta B, MD   2 mg at 10/09/17 1728  . promethazine  (PHENERGAN) tablet 12.5 mg  12.5 mg Oral Q6H PRN Clapacs, John T, MD   12.5 mg at 10/08/17 1647  . pyridOXINE (VITAMIN B-6) tablet 100 mg  100 mg Oral BID Clapacs, Madie Reno, MD   100 mg at 10/09/17 1646  . tiZANidine (ZANAFLEX) tablet 2 mg  2 mg Oral Q8H PRN Pucilowska, Jolanta B, MD   2 mg at 10/02/17 0106  . traZODone (DESYREL) tablet 100 mg  100 mg Oral QHS PRN He, Jun, MD   100 mg at 10/07/17 2347  . traZODone (DESYREL) tablet 50 mg  50 mg Oral QHS Pucilowska, Jolanta B, MD   50 mg at 10/08/17 2102  . triamcinolone cream (KENALOG) 0.1 %   Topical TID PRN Ramond Dial, MD        Lab Results:  Results for orders placed or performed during the hospital encounter of 09/19/17 (from the past 48 hour(s))  Glucose, capillary     Status: None   Collection Time: 10/08/17  5:25 AM  Result Value Ref Range   Glucose-Capillary 86 70 - 99 mg/dL    Blood Alcohol level:  Lab Results  Component Value Date   ETH <5 09/29/2016   ETH <5 56/43/3295    Metabolic Disorder Labs: Lab Results  Component Value Date   HGBA1C 5.7 (H) 09/20/2017   MPG 116.89 09/20/2017   No results found for: PROLACTIN Lab Results  Component Value Date   CHOL 173 09/20/2017   TRIG 193 (H) 09/20/2017   HDL 50 09/20/2017   CHOLHDL 3.5 09/20/2017   VLDL 39 09/20/2017   LDLCALC 84 09/20/2017   LDLCALC 87 09/04/2017    Physical Findings: AIMS:  , ,  ,  ,    CIWA:    COWS:     Musculoskeletal: Strength & Muscle Tone: within normal limits Gait & Station: normal Patient leans: N/A  Psychiatric Specialty Exam: Physical Exam  Nursing note and vitals reviewed. Constitutional: She appears well-developed and well-nourished.  HENT:  Head: Normocephalic and atraumatic.  Eyes: Pupils are equal, round, and reactive to light. Conjunctivae are normal.  Neck: Normal range of motion.  Cardiovascular: Regular rhythm and normal heart sounds.  Respiratory: Effort normal. No respiratory distress.  GI: Soft.   Musculoskeletal: Normal range of motion.  Neurological: She is alert.  Skin: Skin is warm and dry.  Psychiatric: She has a normal mood and affect. Her speech is normal and behavior is normal. Judgment and thought content normal. She exhibits abnormal recent memory.    Review of Systems  Constitutional: Negative.   HENT: Negative.   Eyes: Negative.   Respiratory: Negative.   Cardiovascular: Negative.   Gastrointestinal: Negative.   Musculoskeletal: Negative.   Skin: Negative.   Neurological: Negative.   Psychiatric/Behavioral: Negative.     Blood pressure 108/76, pulse 67, temperature 98.2 F (36.8 C), temperature source Oral, resp. rate 18, height 5\' 3"  (1.6 m), weight 60.3 kg,  SpO2 100 %.Body mass index is 23.56 kg/m.  General Appearance: Casual  Eye Contact:  Good  Speech:  Clear and Coherent  Volume:  Decreased  Mood:  Euthymic  Affect:  Constricted  Thought Process:  Coherent  Orientation:  Full (Time, Place, and Person)  Thought Content:  Logical  Suicidal Thoughts:  No  Homicidal Thoughts:  No  Memory:  Immediate;   Fair Recent;   Fair Remote;   Fair  Judgement:  Fair  Insight:  Fair  Psychomotor Activity:  Normal  Concentration:  Concentration: Fair  Recall:  [name]:  Fair  Language:  Fair  Akathisia:  No  Handed:  Right  AIMS (if indicated):     Assets:  Desire for Improvement Physical Health  ADL's:  Intact  Cognition:  Impaired,  Mild  Sleep:  Number of Hours: 7.45     Treatment Plan Summary: Plan Sleeping better, mood better, thinking better.  Able to actually hold something like a clear lucid conversation about discharge planning.  I am sorry that the insurance people are declining to pay for this woman's very appropriate care but from what I can see we continue to make great improvements that hopefully will help to keep her out of the hospital.  At this phase we were still continue ECT tomorrow although this may be the last her  second to last treatment.  We will continue to work as a treatment team on finding appropriate disposition options.  Alethia Berthold, MD 10/09/2017, 5:34 PM

## 2017-10-09 NOTE — Plan of Care (Signed)
Pt. More complaint with medications this evening, but does not come up for medications and is needed to be brought her medications. Pt. Denies SI/Hi and verbally will contract for safety. Pt. Unable to verbalize positive feelings this evening, just reports today has been horrible.    Problem: Self-Concept: Goal: Will verbalize positive feelings about self Outcome: Not Progressing   Problem: Medication: Goal: Compliance with prescribed medication regimen will improve Outcome: Progressing   Problem: Self-Concept: Goal: Ability to disclose and discuss suicidal ideas will improve Outcome: Progressing   Problem: Safety: Goal: Ability to remain free from injury will improve Outcome: Progressing

## 2017-10-09 NOTE — Plan of Care (Signed)
Verbalize understanding of information received  concerning medications Patient  aware of information received from  staff referring to  education about unit programing .  Able to verbalize understanding. No Attending unit programing , verbalizing feelings somatic  complaints Voice no concerns around  sleep.  Voice no safety concerns .    Emotional and mental health status improving   Problem: Education: Goal: Ability to make informed decisions regarding treatment will improve Outcome: Progressing   Problem: Coping: Goal: Coping ability will improve Outcome: Progressing   Problem: Health Behavior/Discharge Planning: Goal: Identification of resources available to assist in meeting health care needs will improve Outcome: Progressing   Problem: Medication: Goal: Compliance with prescribed medication regimen will improve Outcome: Progressing   Problem: Self-Concept: Goal: Ability to disclose and discuss suicidal ideas will improve Outcome: Progressing Goal: Will verbalize positive feelings about self Outcome: Progressing   Problem: Safety: Goal: Ability to remain free from injury will improve Outcome: Progressing

## 2017-10-09 NOTE — Progress Notes (Signed)
D: Pt denies SI/HI/AVH, contracts for safety. Pt. Isolative and withdrawn to her room this evening resting from ect, reported by patient. Pt. Complains that today has, "just been the worst!". Pt. Reports, "my neck has just been so achy from ect". Pt. Given prn medications for pain management. Pt. Also reports abdominal cramping reportedly from, "ugh, I'm just so constipated the pain is unbearable!". Pt. Reportedly had a bowel movement last 10/06/17. Pt. Given medications per orders to aid in bowel movement. Pt. Bowel sounds active, pt. Passing gas regularly reportedly, and abdomin is soft. Pt. Continues to present sarcastic and argumentative with staff attempting to provide care for her and frequently will talk down to staff this evening. Pt. Presents this evening anxious often. PRN medications given to comfort patient. Pt. Oriented x 4 after ECT this evening.   A: Q x 15 minute observation checks were completed for safety. Patient was provided with education, but is non-accepting.  Patient was given/offered medications per orders. Patient  was encourage to attend groups, participate in unit activities and continue with plan of care. Pt. Chart and plans of care reviewed. Pt. Given support and encouragement.   R: Patient is complaint with medication and unit procedures for the most part, but does require medications to be brought to her, refusing to get out of bed. Pt. Utilizes front wheel walker PRN for safety.              Precautionary checks every 15 minutes for safety maintained, room free of safety hazards, patient sustains no injury or falls during this shift. Will endorse care to next shift.

## 2017-10-10 ENCOUNTER — Inpatient Hospital Stay: Payer: Medicare Other

## 2017-10-10 ENCOUNTER — Other Ambulatory Visit: Payer: Self-pay | Admitting: Psychiatry

## 2017-10-10 ENCOUNTER — Ambulatory Visit (HOSPITAL_COMMUNITY): Payer: Self-pay | Admitting: Psychiatry

## 2017-10-10 LAB — GLUCOSE, CAPILLARY: GLUCOSE-CAPILLARY: 97 mg/dL (ref 70–99)

## 2017-10-10 MED ORDER — GLYCOPYRROLATE 0.2 MG/ML IJ SOLN
0.1000 mg | Freq: Once | INTRAMUSCULAR | Status: AC
  Administered 2017-10-10: 0.1 mg via INTRAVENOUS

## 2017-10-10 MED ORDER — KETOROLAC TROMETHAMINE 30 MG/ML IJ SOLN
30.0000 mg | Freq: Once | INTRAMUSCULAR | Status: AC
  Administered 2017-10-10: 30 mg via INTRAVENOUS

## 2017-10-10 MED ORDER — KETOROLAC TROMETHAMINE 30 MG/ML IJ SOLN
INTRAMUSCULAR | Status: AC
  Administered 2017-10-10: 30 mg via INTRAVENOUS
  Filled 2017-10-10: qty 1

## 2017-10-10 MED ORDER — METHOHEXITAL SODIUM 100 MG/10ML IV SOSY
PREFILLED_SYRINGE | INTRAVENOUS | Status: DC | PRN
  Administered 2017-10-10: 80 mg via INTRAVENOUS

## 2017-10-10 MED ORDER — GLYCOPYRROLATE 0.2 MG/ML IJ SOLN
INTRAMUSCULAR | Status: AC
  Administered 2017-10-10: 0.1 mg via INTRAVENOUS
  Filled 2017-10-10: qty 1

## 2017-10-10 MED ORDER — SUCCINYLCHOLINE CHLORIDE 20 MG/ML IJ SOLN
INTRAMUSCULAR | Status: AC
  Filled 2017-10-10: qty 1

## 2017-10-10 MED ORDER — SODIUM CHLORIDE 0.9 % IV SOLN
500.0000 mL | Freq: Once | INTRAVENOUS | Status: AC
  Administered 2017-10-10: 500 mL via INTRAVENOUS

## 2017-10-10 MED ORDER — SUCCINYLCHOLINE CHLORIDE 20 MG/ML IJ SOLN
INTRAMUSCULAR | Status: DC | PRN
  Administered 2017-10-10: 100 mg via INTRAVENOUS

## 2017-10-10 NOTE — H&P (Signed)
Angela [name] is an 67 y.o. female.   Chief Complaint: Still has some somatic complaints but as far as her mood things are significantly better.  Not having suicidal thoughts HPI: History of long-standing bipolar disorder with mixed episodes of depression.  History of suicidality.  Past history of good response to ECT.  Past Medical History:  Diagnosis Date  . Allergic rhinitis   . Anemia   . Blurred vision   . Depression   . Diverticulosis   . Endometriosis   . Falls   . GERD (gastroesophageal reflux disease)   . Hernia, hiatal   . Hypothyroidism   . IBS (irritable bowel syndrome)   . Malignant neoplasm of skin   . Migraine   . Neuropathy   . PTSD (post-traumatic stress disorder)   . Thyroid disease     Past Surgical History:  Procedure Laterality Date  . APPENDECTOMY    . CHOLECYSTECTOMY    . CHOLECYSTECTOMY, LAPAROSCOPIC    . ESOPHAGOGASTRODUODENOSCOPY (EGD) WITH PROPOFOL N/A 03/29/2015   Procedure: ESOPHAGOGASTRODUODENOSCOPY (EGD) WITH PROPOFOL;  Surgeon: Josefine Class, MD;  Location: [facility] ENDOSCOPY;  Service: Endoscopy;  Laterality: N/A;  . HERNIA REPAIR    . laproscopy    . TONSILLECTOMY    . uteral suspension      Family History  Problem Relation Age of Onset  . Heart disease Father   . Cancer Mother        lung  . Urolithiasis Neg Hx   . Kidney disease Neg Hx   . Kidney cancer Neg Hx   . Prostate cancer Neg Hx    Social History:  reports that she quit smoking about 40 years ago. She has never used smokeless tobacco. She reports that she does not drink alcohol or use drugs.  Allergies:  Allergies  Allergen Reactions  . Bee Venom Hives  . Darvon [Propoxyphene] Anaphylaxis and Hives    Hives/throat swelling   . Dicyclomine Itching  . Other Hives    Spider bites cause hives  . Oxycodone-Acetaminophen Nausea And Vomiting and Other (See Comments)    hallucinations   . Peanuts [Peanut Oil] Anaphylaxis and Hives  . Penicillins Hives    Has  patient had a PCN reaction causing immediate rash, facial/tongue/throat swelling, SOB or lightheadedness with hypotension: Unknown Has patient had a PCN reaction causing severe rash involving mucus membranes or skin necrosis: Unknown Has patient had a PCN reaction that required hospitalization: Unknown Has patient had a PCN reaction occurring within the last 10 years: Unknown If all of the above answers are "NO", then may proceed with Cephalosporin use.  . Sulfa Antibiotics Anaphylaxis, Hives and Itching  . Sulfacetamide Sodium Anaphylaxis, Hives and Itching  . Ultram [Tramadol] Hives  . Amikacin Nausea And Vomiting  . Aspirin Hives  . Doxycycline Photosensitivity  . Haloperidol Nausea And Vomiting  . Hymenoptera Venom Preparations Hives    Reaction to spider bites  . Imipramine Hives    Reaction to Tofranil  . Keflex [Cephalexin] Hives  . Lactose Intolerance (Gi)   . Sulfur Hives  . Adhesive [Tape] Hives and Rash  . Hydroxyzine Anxiety and Itching  . Prednisone Nausea And Vomiting, Anxiety and Other (See Comments)    Crazy mood swings, strange thoughts (pt does tolerate cortisone injections)    Medications Prior to Admission  Medication Sig Dispense Refill  . amLODipine (NORVASC) 5 MG tablet Take 1 tablet (5 mg total) by mouth 2 (two) times daily. 180 tablet 1  .  ANORO ELLIPTA 62.5-25 MCG/INH AEPB Inhale 1 puff into the lungs daily. 14 each 0  . calcium carbonate (OS-CAL) 600 MG TABS tablet Take 1 tablet (600 mg total) by mouth 2 (two) times daily with a meal. (Patient taking differently: Take 600 mg 3 (three) times daily by mouth. ) 60 tablet 0  . cholecalciferol (VITAMIN D) 1000 units tablet Take 4,000 Units by mouth daily.    . citalopram (CELEXA) 20 MG tablet Take 20 mg by mouth daily.  0  . conjugated estrogens (PREMARIN) vaginal cream Place vaginally 3 (three) times a week. Use 1/2 gram 3-4 times weekly. 42.5 g 4  . cycloSPORINE (RESTASIS) 0.05 % ophthalmic emulsion Place 1  drop into both eyes 2 (two) times daily. 60 each 5  . diclofenac sodium (VOLTAREN) 1 % GEL Apply 2 g topically 4 (four) times daily.    . ferrous sulfate (FERROUSUL) 325 (65 FE) MG tablet Take 1 tablet (325 mg total) by mouth daily with breakfast. 90 tablet 1  . gabapentin (NEURONTIN) 100 MG capsule Take 1-2 capsules (100-200 mg total) by mouth 3 (three) times daily. (Patient taking differently: Take 100 mg by mouth 2 (two) times daily. ) 540 capsule 1  . guaiFENesin (MUCINEX) 600 MG 12 hr tablet Take 1 tablet (600 mg total) by mouth 2 (two) times daily as needed for cough. (Patient taking differently: Take 600 mg by mouth daily. ) 30 tablet 0  . hydrocortisone (PROCTOZONE-HC) 2.5 % rectal cream Place 1 application rectally 2 (two) times daily. 30 g 1  . hydrOXYzine (ATARAX/VISTARIL) 10 MG tablet Take 1 tablet by mouth 2 (two) times daily as needed for anxiety.     Marland Kitchen KRILL OIL OMEGA-3 PO Take 350 mg by mouth daily.    Marland Kitchen levalbuterol (XOPENEX) 1.25 MG/3ML nebulizer solution Inhale 3 mLs into the lungs daily.    Marland Kitchen levothyroxine (SYNTHROID, LEVOTHROID) 100 MCG tablet Take 1 tablet (100 mcg total) by mouth daily before breakfast. 90 tablet 1  . linaclotide (LINZESS) 290 MCG CAPS capsule Take 1 capsule (290 mcg total) by mouth daily before breakfast. 90 capsule 1  . magnesium oxide (MAG-OX) 400 MG tablet Take 400 mg by mouth daily with breakfast.    . montelukast (SINGULAIR) 10 MG tablet Take 1 tablet by mouth at bedtime.  11  . omeprazole (PRILOSEC) 20 MG capsule Take 1 capsule by mouth daily.    Vladimir Faster Glycol-Propyl Glycol (SYSTANE OP) Place 1 drop into both eyes daily as needed (dry eyes).    . polyethylene glycol powder (GLYCOLAX/MIRALAX) powder Take 17-34 g by mouth daily. 1700 g 5  . Probiotic Product (PROBIOTIC PO) Take 1 tablet by mouth daily.    . promethazine (PHENERGAN) 25 MG tablet Take 0.5 tablets (12.5 mg total) by mouth every 6 (six) hours as needed for nausea or vomiting. 30 tablet 0   . Pyridoxine HCl (VITAMIN B-6) 500 MG tablet Take 1 tablet by mouth 2 (two) times daily.    . ranitidine (ZANTAC) 150 MG tablet Take 150 mg by mouth daily before supper.    Marland Kitchen tiZANidine (ZANAFLEX) 2 MG tablet Take 0.5-1 tablets (1-2 mg total) by mouth 2 (two) times daily as needed for muscle spasms. 180 tablet 1  . tolterodine (DETROL LA) 2 MG 24 hr capsule Take 1 capsule (2 mg total) daily by mouth. 30 capsule 11  . traZODone (DESYREL) 50 MG tablet Take 50 mg by mouth at bedtime.      Results for orders placed  or performed during the hospital encounter of 09/19/17 (from the past 48 hour(s))  Glucose, capillary     Status: None   Collection Time: 10/10/17  6:25 AM  Result Value Ref Range   Glucose-Capillary 97 70 - 99 mg/dL   No results found.  Review of Systems  Constitutional: Negative.   HENT: Negative.   Eyes: Negative.   Respiratory: Negative.   Cardiovascular: Negative.   Gastrointestinal: Negative.   Musculoskeletal: Negative.   Skin: Negative.   Neurological: Negative.   Psychiatric/Behavioral: Positive for memory loss. Negative for depression, hallucinations, substance abuse and suicidal ideas. The patient is not nervous/anxious and does not have insomnia.     Blood pressure (!) 142/78, pulse (!) 53, temperature 98.3 F (36.8 C), temperature source Oral, resp. rate 16, height 5\' 3"  (1.6 m), weight 59.9 kg, SpO2 99 %. Physical Exam  Nursing note and vitals reviewed. Constitutional: She appears well-developed and well-nourished.  HENT:  Head: Normocephalic and atraumatic.  Eyes: Pupils are equal, round, and reactive to light. Conjunctivae are normal.  Neck: Normal range of motion.  Cardiovascular: Normal heart sounds.  Respiratory: Effort normal.  GI: Soft.  Musculoskeletal: Normal range of motion.  Neurological: She is alert.  Skin: Skin is warm and dry.  Psychiatric: Her affect is blunt. Her speech is delayed. She is slowed. Thought content is not paranoid. She  expresses impulsivity. She expresses no homicidal and no suicidal ideation. She exhibits abnormal recent memory.     Assessment/Plan Patient is doing well significantly showing improvement.  Now having some cognitive problems.  We are going to do treatment today and then we will not do treatment on Friday or Monday.  If it is possible and she is available we will consider Wednesday.  Alethia Berthold, MD 10/10/2017, 11:00 AM

## 2017-10-10 NOTE — Tx Team (Signed)
Interdisciplinary Treatment and Diagnostic Plan Update  10/10/2017 Time of Session: 2:00 PM Angela [name] MRN: [id number]  Principal Diagnosis: Bipolar I disorder, most recent episode mixed, severe with psychotic features ([place])  Secondary Diagnoses: Principal Problem:   Bipolar I disorder, most recent episode mixed, severe with psychotic features ([place]) Active Problems:   Hypothyroidism   Essential hypertension   Plantar fasciitis, bilateral   PTSD (post-traumatic stress disorder)   Agitation   Current Medications:  Current Facility-Administered Medications  Medication Dose Route Frequency Provider Last Rate Last Dose  . acetaminophen (TYLENOL) tablet 650 mg  650 mg Oral Q6H PRN Clapacs, Madie Reno, MD   650 mg at 10/08/17 2103  . albuterol (PROVENTIL) (2.5 MG/3ML) 0.083% nebulizer solution 2.5 mg  2.5 mg Inhalation Q6H PRN Clapacs, John T, MD      . alum & mag hydroxide-simeth (MAALOX/MYLANTA) 200-200-20 MG/5ML suspension 30 mL  30 mL Oral Q4H PRN Clapacs, Madie Reno, MD   30 mL at 10/08/17 1507  . amLODipine (NORVASC) tablet 5 mg  5 mg Oral BID Clapacs, Madie Reno, MD   5 mg at 10/10/17 3875  . calcium carbonate (TUMS - dosed in mg elemental calcium) chewable tablet 500 mg of elemental calcium  500 mg of elemental calcium Oral BID WC Pucilowska, Jolanta B, MD   500 mg of elemental calcium at 10/09/17 1647  . cholecalciferol (VITAMIN D) tablet 1,000 Units  1,000 Units Oral Daily Pucilowska, Jolanta B, MD   1,000 Units at 10/10/17 1358  . cycloSPORINE (RESTASIS) 0.05 % ophthalmic emulsion 1 drop  1 drop Both Eyes BID Clapacs, Madie Reno, MD   1 drop at 10/10/17 0617  . diclofenac sodium (VOLTAREN) 1 % transdermal gel 4 g  4 g Topical QID Pucilowska, Jolanta B, MD   4 g at 10/10/17 1206  . docusate sodium (COLACE) capsule 100 mg  100 mg Oral BID Clapacs, Madie Reno, MD   100 mg at 10/10/17 1358  . feeding supplement (BOOST / RESOURCE BREEZE) liquid 1 Container  1 Container Oral TID BM Clapacs, Madie Reno,  MD   1 Container at 10/10/17 1411  . fentaNYL (SUBLIMAZE) injection 25 mcg  25 mcg Intravenous Q5 min PRN Gunnar Bulla, MD      . fentaNYL (SUBLIMAZE) injection 25 mcg  25 mcg Intravenous Q5 min PRN Alvin Critchley, MD      . ferrous sulfate tablet 325 mg  325 mg Oral Q breakfast Pucilowska, Jolanta B, MD   325 mg at 10/10/17 1358  . gabapentin (NEURONTIN) capsule 100 mg  100 mg Oral BID Clapacs, Madie Reno, MD   100 mg at 10/10/17 1358  . glycopyrrolate (ROBINUL) injection 0.1 mg  0.1 mg Intravenous Once Clapacs, John T, MD      . hydrocortisone cream 1 %   Topical BID Clapacs, John T, MD      . levothyroxine (SYNTHROID, LEVOTHROID) tablet 100 mcg  100 mcg Oral QAC breakfast Pucilowska, Jolanta B, MD   100 mcg at 10/09/17 0646  . lidocaine (LIDODERM) 5 % 1 patch  1 patch Transdermal Q24H Pucilowska, Jolanta B, MD   1 patch at 10/10/17 1359  . linaclotide (LINZESS) capsule 290 mcg  290 mcg Oral QAC breakfast Pucilowska, Jolanta B, MD   290 mcg at 10/09/17 0645  . lip balm (BLISTEX) ointment   Topical PRN Clapacs, John T, MD      . LORazepam (ATIVAN) tablet 0.5 mg  0.5 mg Oral Q6H PRN Clapacs, Madie Reno,  MD   0.5 mg at 10/08/17 2102  . magnesium hydroxide (MILK OF MAGNESIA) suspension 30 mL  30 mL Oral Daily PRN Clapacs, Madie Reno, MD   30 mL at 10/05/17 1731  . magnesium oxide (MAG-OX) tablet 200 mg  200 mg Oral BID Pucilowska, Jolanta B, MD   200 mg at 10/10/17 1401  . midazolam (VERSED) injection 2 mg  2 mg Intravenous Once Clapacs, John T, MD      . multivitamin with minerals tablet 1 tablet  1 tablet Oral Daily Pucilowska, Jolanta B, MD   1 tablet at 10/10/17 1357  . omega-3 acid ethyl esters (LOVAZA) capsule 1 g  1 g Oral BID Pucilowska, Jolanta B, MD   1 g at 10/09/17 1645  . ondansetron (ZOFRAN) injection 4 mg  4 mg Intravenous Once Clapacs, John T, MD      . ondansetron [facility]) injection 4 mg  4 mg Intravenous Once PRN Gunnar Fusi, MD      . ondansetron [facility]) injection 4 mg  4 mg  Intravenous Once PRN Gunnar Bulla, MD      . ondansetron [facility]) injection 4 mg  4 mg Intravenous Once PRN Alvin Critchley, MD      . ondansetron (ZOFRAN-ODT) disintegrating tablet 4 mg  4 mg Oral Q8H PRN Pucilowska, Jolanta B, MD   4 mg at 10/08/17 1807  . pantoprazole (PROTONIX) EC tablet 40 mg  40 mg Oral Daily Pucilowska, Jolanta B, MD   40 mg at 10/10/17 1403  . PARoxetine (PAXIL-CR) 24 hr tablet 25 mg  25 mg Oral Daily Pucilowska, Jolanta B, MD   25 mg at 10/10/17 1406  . polyvinyl alcohol (LIQUIFILM TEARS) 1.4 % ophthalmic solution 1 drop  1 drop Both Eyes PRN Ramond Dial, MD   1 drop at 10/08/17 1247  . prazosin (MINIPRESS) capsule 2 mg  2 mg Oral BID Pucilowska, Jolanta B, MD   2 mg at 10/09/17 1728  . promethazine (PHENERGAN) tablet 12.5 mg  12.5 mg Oral Q6H PRN Clapacs, John T, MD   12.5 mg at 10/08/17 1647  . pyridOXINE (VITAMIN B-6) tablet 100 mg  100 mg Oral BID Clapacs, Madie Reno, MD   100 mg at 10/10/17 1405  . tiZANidine (ZANAFLEX) tablet 2 mg  2 mg Oral Q8H PRN Pucilowska, Jolanta B, MD   2 mg at 10/02/17 0106  . traZODone (DESYREL) tablet 100 mg  100 mg Oral QHS PRN He, Jun, MD   100 mg at 10/07/17 2347  . traZODone (DESYREL) tablet 50 mg  50 mg Oral QHS Pucilowska, Jolanta B, MD   50 mg at 10/09/17 2226  . triamcinolone cream (KENALOG) 0.1 %   Topical TID PRN Ramond Dial, MD       PTA Medications: Medications Prior to Admission  Medication Sig Dispense Refill Last Dose  . amLODipine (NORVASC) 5 MG tablet Take 1 tablet (5 mg total) by mouth 2 (two) times daily. 180 tablet 1 10/09/2017 at Unknown time  . ANORO ELLIPTA 62.5-25 MCG/INH AEPB Inhale 1 puff into the lungs daily. 14 each 0 10/09/2017 at Unknown time  . calcium carbonate (OS-CAL) 600 MG TABS tablet Take 1 tablet (600 mg total) by mouth 2 (two) times daily with a meal. (Patient taking differently: Take 600 mg 3 (three) times daily by mouth. ) 60 tablet 0 10/09/2017 at Unknown time  . cholecalciferol (VITAMIN D)  1000 units tablet Take 4,000 Units by mouth daily.   10/09/2017 at Unknown time  .  citalopram (CELEXA) 20 MG tablet Take 20 mg by mouth daily.  0 10/09/2017 at Unknown time  . conjugated estrogens (PREMARIN) vaginal cream Place vaginally 3 (three) times a week. Use 1/2 gram 3-4 times weekly. 42.5 g 4 10/09/2017 at Unknown time  . cycloSPORINE (RESTASIS) 0.05 % ophthalmic emulsion Place 1 drop into both eyes 2 (two) times daily. 60 each 5 10/09/2017 at Unknown time  . diclofenac sodium (VOLTAREN) 1 % GEL Apply 2 g topically 4 (four) times daily.   10/09/2017 at Unknown time  . ferrous sulfate (FERROUSUL) 325 (65 FE) MG tablet Take 1 tablet (325 mg total) by mouth daily with breakfast. 90 tablet 1 10/09/2017 at Unknown time  . gabapentin (NEURONTIN) 100 MG capsule Take 1-2 capsules (100-200 mg total) by mouth 3 (three) times daily. (Patient taking differently: Take 100 mg by mouth 2 (two) times daily. ) 540 capsule 1 10/09/2017 at Unknown time  . guaiFENesin (MUCINEX) 600 MG 12 hr tablet Take 1 tablet (600 mg total) by mouth 2 (two) times daily as needed for cough. (Patient taking differently: Take 600 mg by mouth daily. ) 30 tablet 0 10/09/2017 at Unknown time  . hydrocortisone (PROCTOZONE-HC) 2.5 % rectal cream Place 1 application rectally 2 (two) times daily. 30 g 1 10/09/2017 at Unknown time  . hydrOXYzine (ATARAX/VISTARIL) 10 MG tablet Take 1 tablet by mouth 2 (two) times daily as needed for anxiety.    10/09/2017 at Unknown time  . KRILL OIL OMEGA-3 PO Take 350 mg by mouth daily.   10/09/2017 at Unknown time  . levalbuterol (XOPENEX) 1.25 MG/3ML nebulizer solution Inhale 3 mLs into the lungs daily.   10/09/2017 at Unknown time  . levothyroxine (SYNTHROID, LEVOTHROID) 100 MCG tablet Take 1 tablet (100 mcg total) by mouth daily before breakfast. 90 tablet 1 10/09/2017 at Unknown time  . linaclotide (LINZESS) 290 MCG CAPS capsule Take 1 capsule (290 mcg total) by mouth daily before breakfast. 90 capsule 1 10/09/2017  at Unknown time  . magnesium oxide (MAG-OX) 400 MG tablet Take 400 mg by mouth daily with breakfast.   10/09/2017 at Unknown time  . montelukast (SINGULAIR) 10 MG tablet Take 1 tablet by mouth at bedtime.  11 10/09/2017 at Unknown time  . omeprazole (PRILOSEC) 20 MG capsule Take 1 capsule by mouth daily.   10/09/2017 at Unknown time  . Polyethyl Glycol-Propyl Glycol (SYSTANE OP) Place 1 drop into both eyes daily as needed (dry eyes).   10/09/2017 at Unknown time  . polyethylene glycol powder (GLYCOLAX/MIRALAX) powder Take 17-34 g by mouth daily. 1700 g 5 10/09/2017 at Unknown time  . Probiotic Product (PROBIOTIC PO) Take 1 tablet by mouth daily.   10/09/2017 at Unknown time  . promethazine (PHENERGAN) 25 MG tablet Take 0.5 tablets (12.5 mg total) by mouth every 6 (six) hours as needed for nausea or vomiting. 30 tablet 0 10/09/2017 at Unknown time  . Pyridoxine HCl (VITAMIN B-6) 500 MG tablet Take 1 tablet by mouth 2 (two) times daily.   10/09/2017 at Unknown time  . ranitidine (ZANTAC) 150 MG tablet Take 150 mg by mouth daily before supper.   10/09/2017 at Unknown time  . tiZANidine (ZANAFLEX) 2 MG tablet Take 0.5-1 tablets (1-2 mg total) by mouth 2 (two) times daily as needed for muscle spasms. 180 tablet 1 10/09/2017 at Unknown time  . tolterodine (DETROL LA) 2 MG 24 hr capsule Take 1 capsule (2 mg total) daily by mouth. 30 capsule 11 10/09/2017 at Unknown time  .  traZODone (DESYREL) 50 MG tablet Take 50 mg by mouth at bedtime.   10/09/2017 at Unknown time    Patient Stressors: Financial difficulties Health problems Traumatic event Other: "Pain" "fear" "trauma" "homeless"  Patient Strengths: Ability for insight Communication skills General fund of knowledge Motivation for treatment/growth  Treatment Modalities: Medication Management, Group therapy, Case management,  1 to 1 session with clinician, Psychoeducation, Recreational therapy.   Physician Treatment Plan for Primary Diagnosis: Bipolar I  disorder, most recent episode mixed, severe with psychotic features ([place]) Long Term Goal(s): Improvement in symptoms so as ready for discharge Improvement in symptoms so as ready for discharge   Short Term Goals: Ability to identify changes in lifestyle to reduce recurrence of condition will improve Ability to verbalize feelings will improve Ability to disclose and discuss suicidal ideas Ability to demonstrate self-control will improve Ability to identify and develop effective coping behaviors will improve Ability to maintain clinical measurements within normal limits will improve Compliance with prescribed medications will improve Ability to identify triggers associated with substance abuse/mental health issues will improve NA  Medication Management: Evaluate patient's response, side effects, and tolerance of medication regimen.  Therapeutic Interventions: 1 to 1 sessions, Unit Group sessions and Medication administration.  Evaluation of Outcomes: Progressing  Physician Treatment Plan for Secondary Diagnosis: Principal Problem:   Bipolar I disorder, most recent episode mixed, severe with psychotic features ([place]) Active Problems:   Hypothyroidism   Essential hypertension   Plantar fasciitis, bilateral   PTSD (post-traumatic stress disorder)   Agitation  Long Term Goal(s): Improvement in symptoms so as ready for discharge Improvement in symptoms so as ready for discharge   Short Term Goals: Ability to identify changes in lifestyle to reduce recurrence of condition will improve Ability to verbalize feelings will improve Ability to disclose and discuss suicidal ideas Ability to demonstrate self-control will improve Ability to identify and develop effective coping behaviors will improve Ability to maintain clinical measurements within normal limits will improve Compliance with prescribed medications will improve Ability to identify triggers associated with substance abuse/mental  health issues will improve NA     Medication Management: Evaluate patient's response, side effects, and tolerance of medication regimen.  Therapeutic Interventions: 1 to 1 sessions, Unit Group sessions and Medication administration.  Evaluation of Outcomes: Progressing   RN Treatment Plan for Primary Diagnosis: Bipolar I disorder, most recent episode mixed, severe with psychotic features ([place]) Long Term Goal(s): Knowledge of disease and therapeutic regimen to maintain health will improve  Short Term Goals: Ability to identify and develop effective coping behaviors will improve and Compliance with prescribed medications will improve  Medication Management: RN will administer medications as ordered by provider, will assess and evaluate patient's response and provide education to patient for prescribed medication. RN will report any adverse and/or side effects to prescribing provider.  Therapeutic Interventions: 1 on 1 counseling sessions, Psychoeducation, Medication administration, Evaluate responses to treatment, Monitor vital signs and CBGs as ordered, Perform/monitor CIWA, COWS, AIMS and Fall Risk screenings as ordered, Perform wound care treatments as ordered.  Evaluation of Outcomes: Progressing   LCSW Treatment Plan for Primary Diagnosis: Bipolar I disorder, most recent episode mixed, severe with psychotic features ([place]) Long Term Goal(s): Safe transition to appropriate next level of care at discharge, Engage patient in therapeutic group addressing interpersonal concerns.  Short Term Goals: Engage patient in aftercare planning with referrals and resources, Increase social support and Increase skills for wellness and recovery  Therapeutic Interventions: Assess for all discharge needs, 1 to  1 time with Education officer, museum, Explore available resources and support systems, Assess for adequacy in community support network, Educate family and significant other(s) on suicide prevention, Complete  Psychosocial Assessment, Interpersonal group therapy.  Evaluation of Outcomes: Progressing   Progress in Treatment: Attending groups: Yes. Participating in groups: Yes. Taking medication as prescribed: Yes. Toleration medication: Yes. Family/Significant other contact made: No, will contact:  pt declined consent Patient understands diagnosis: Yes. Discussing patient identified problems/goals with staff: Yes. Medical problems stabilized or resolved: Yes. Denies suicidal/homicidal ideation: Yes. Issues/concerns per patient self-inventory: No. Other: none  New problem(s) identified: No, Describe:  none  New Short Term/Long Term Goal(s):  Patient Goals: Currently receiving ECT treatment  Discharge Plan or Barriers: TBD.  Pt is making some progress with ECT, but still quite irritable and demanding. Pt is also in need of securing a place to live that is appropriate and safe.  Reason for Continuation of Hospitalization: Depression, irritability, disorganization, refusing medications at times. Pt needs to continue ECT.  Estimated Length of Stay: 5-7 days.  Attendees: Patient: 10/10/2017   Physician:  10/10/2017   Nursing:  10/10/2017  RN Care Manager:   Social Worker:Bedelia Pong LCSW 10/10/2017  Recreational Therapist:  10/10/2017  Other:  10/10/2017  Other:    Other:     Scribe for Treatment Team: August Saucer, LCSW 10/10/2017 5:40 PM

## 2017-10-10 NOTE — Anesthesia Preprocedure Evaluation (Signed)
Anesthesia Evaluation  Patient identified by MRN, date of birth, ID band Patient awake    Reviewed: Allergy & Precautions, NPO status , Patient's Chart, lab work & pertinent test results, reviewed documented beta blocker date and time   History of Anesthesia Complications Negative for: history of anesthetic complications  Airway Mallampati: II  TM Distance: >3 FB     Dental  (+) Chipped   Pulmonary neg pulmonary ROS, former smoker,           Cardiovascular hypertension,      Neuro/Psych  Headaches, PSYCHIATRIC DISORDERS Anxiety Depression Bipolar Disorder TIA Neuromuscular disease    GI/Hepatic hiatal hernia, GERD  Controlled,  Endo/Other  Hypothyroidism   Renal/GU      Musculoskeletal   Abdominal   Peds  Hematology  (+) anemia ,   Anesthesia Other Findings IADH. Low sats 95%.  Reproductive/Obstetrics negative OB ROS                             Anesthesia Physical  Anesthesia Plan  ASA: III  Anesthesia Plan: General   Post-op Pain Management:    Induction: Intravenous  PONV Risk Score and Plan: TIVA  Airway Management Planned:   Additional Equipment:   Intra-op Plan:   Post-operative Plan:   Informed Consent: I have reviewed the patients History and Physical, chart, labs and discussed the procedure including the risks, benefits and alternatives for the proposed anesthesia with the patient or authorized representative who has indicated his/her understanding and acceptance.     Plan Discussed with: CRNA  Anesthesia Plan Comments:         Anesthesia Quick Evaluation

## 2017-10-10 NOTE — Progress Notes (Signed)
D:Patient escorted to and from ECT without incidence  Noted later in shift to get upset about losses in her life . Unable to decide on  her  Placement  Concerns  Up ambulating about on unit, periods of crying about her losses with boyfriend and father .  Wanted to call sister  But afraid . Writer placed call and patient was able to talk to sister in [place]  Patient stated slept fair last night .Stated appetite poor and energy level  Is normal. Stated concentration poor. Stated on Depression scale , hopeless and anxiety .( low 0-10 high) Denies suicidal  homicidal ideations  .  No auditory hallucinations  No pain concerns . Appropriate ADL'S. Interacting with peers and staff.  A: Encourage patient participation with unit programming . Instruction  Given on  Medication , verbalize understanding. R: Voice no other concerns. Staff continue to monitor

## 2017-10-10 NOTE — Progress Notes (Addendum)
Patient ID: Angela [name], female   DOB: 09-Nov-1950, 67 y.o.   MRN: [id number] Pt approached CSW asking if she could call her sister and a friend.  CSW informed her that she could not make phone calls outside of phone hours but could call them at 5:00pm when phones were back on.  She then begins to speak to [place] angrily asking what I am doing for her discharge plan.  CSW went on to explain calmly that she has tied our hands in declining placement at a facility. Educated her that we are not real estate agents and that we do not have the ability to find her an apartment that suits her specific requests, budget, and location.  She will need to work on her own housing.  CSW informed her that she would assist if she is willing to consider group home/ALF, boarding house, or hotel options but otherwise CSW recommended her to spend her time and energy in making productive choices and contacts.  Pt walked away from [place] saying "you have an attitude"  Paged physician to discuss Pt's case without a return call.  Dossie Arbour, LCSW

## 2017-10-10 NOTE — Transfer of Care (Signed)
Immediate Anesthesia Transfer of Care Note  Patient: Angela [name]  Procedure(s) Performed: ECT TX  Patient Location: PACU  Anesthesia Type:General  Level of Consciousness: drowsy  Airway & Oxygen Therapy: Patient Spontanous Breathing and Patient connected to face mask oxygen  Post-op Assessment: Report given to RN and Post -op Vital signs reviewed and stable  Post vital signs: Reviewed and stable  Last Vitals:  Vitals Value Taken Time  BP 156/87 10/10/2017 11:15 AM  Temp 37.2 C 10/10/2017 11:15 AM  Pulse 48 10/10/2017 11:17 AM  Resp 18 10/10/2017 11:17 AM  SpO2 83 % 10/10/2017 11:17 AM  Vitals shown include unvalidated device data.  Last Pain:  Vitals:   10/10/17 0938  TempSrc: Oral  PainSc:       Patients Stated Pain Goal: 0 (65/46/50 [id number])  Complications: No apparent anesthesia complications

## 2017-10-10 NOTE — Anesthesia Procedure Notes (Addendum)
Date/Time: 10/10/2017 11:07 AM Performed by: Dionne Bucy, CRNA Pre-anesthesia Checklist: Patient identified, Emergency Drugs available, Suction available and Patient being monitored Patient Re-evaluated:Patient Re-evaluated prior to induction Oxygen Delivery Method: Circle system utilized Preoxygenation: Pre-oxygenation with 100% oxygen Induction Type: IV induction Ventilation: Mask ventilation without difficulty and Mask ventilation throughout procedure Airway Equipment and Method: Bite block Placement Confirmation: positive ETCO2 Dental Injury: Teeth and Oropharynx as per pre-operative assessment

## 2017-10-10 NOTE — Plan of Care (Signed)
Verbalize understanding of information received  concerning medications Patient  aware of information received from  staff referring to  education about unit programing .  Able to verbalize understanding. No Attending unit programing , verbalizing feelings somatic  complaints Voice no concerns around  sleep.  Voice no safety concerns .  Emotional and mental health status improving . Unable to make decision about  Placement    Problem: Education: Goal: Ability to make informed decisions regarding treatment will improve Outcome: Progressing   Problem: Coping: Goal: Coping ability will improve Outcome: Progressing   Problem: Health Behavior/Discharge Planning: Goal: Identification of resources available to assist in meeting health care needs will improve Outcome: Progressing   Problem: Medication: Goal: Compliance with prescribed medication regimen will improve Outcome: Progressing   Problem: Self-Concept: Goal: Ability to disclose and discuss suicidal ideas will improve Outcome: Progressing Goal: Will verbalize positive feelings about self Outcome: Progressing   Problem: Safety: Goal: Ability to remain free from injury will improve Outcome: Progressing

## 2017-10-10 NOTE — Plan of Care (Signed)
  Problem: Education: Goal: Ability to make informed decisions regarding treatment will improve Outcome: Progressing  Patient able to make decisions regarding treatment

## 2017-10-10 NOTE — Progress Notes (Signed)
Patient is alert and oriented x 4, denies pain and discomfort, affect is flat but she brightens upon approach, she appears less irritable, less intrusive, no aggressive behavior towards staff. Patient is scheduled for ECT in the morning, NPO status after midnight, she is aware and excited, patient is interacting with peers and ,staff appropriately, no distress noted.  15 minutes  safety checks maintained will continue to monitor.

## 2017-10-10 NOTE — Procedures (Signed)
ECT SERVICES Physician's Interval Evaluation & Treatment Note  Patient Identification: Angela [name] MRN:  [id number] Date of Evaluation:  10/10/2017 TX #: 6  MADRS:   MMSE:   P.E. Findings:  No change to physical exam  Psychiatric Interval Note:  Mood is feeling significantly better less agitated less labile less hopeless  Subjective:  Patient is a 67 y.o. female seen for evaluation for Electroconvulsive Therapy. No longer voicing suicidal thoughts  Treatment Summary:   [x]   Right Unilateral             []  Bilateral   % Energy : 0.3 ms 70%   Impedance: 1200 ohms  Seizure Energy Index: 4073 V squared  Postictal Suppression Index: 25% (I think this is actually a missed reading because I think the computer misread the end of the seizure)  Seizure Concordance Index: 79%  Medications  Pre Shock: Toradol 30 mg Zofran 4 mg Brevital 60 mg succinylcholine 80 mg  Post Shock:    Seizure Duration: 18 seconds EMG 46 seconds EEG   Comments: Follow-up in 1 week we will skip Friday and Monday to give her chance to improve cognitively  Lungs:  [x]   Clear to auscultation               []  Other:   Heart:    [x]   Regular rhythm             []  irregular rhythm    [x]   Previous H&P reviewed, patient examined and there are NO CHANGES                 []   Previous H&P reviewed, patient examined and there are changes noted.   Alethia Berthold, MD 9/25/201911:02 AM

## 2017-10-10 NOTE — Progress Notes (Signed)
Recreation Therapy Notes  Date: 10/10/2017  Time: 9:30 am   Location: Craft room   Behavioral response: N/A   Intervention Topic: Leisure  Discussion/Intervention: Patient did not attend group.   Clinical Observations/Feedback:  Patient did not attend group.   Jared Cahn LRT/CTRS        Tyquisha Sharps 10/10/2017 11:19 AM

## 2017-10-10 NOTE — Anesthesia Post-op Follow-up Note (Signed)
Anesthesia QCDR form completed.        

## 2017-10-11 NOTE — Progress Notes (Signed)
D: Patient denies SI/HI/AVH., affect is brighter, she is pleasant and cooperative. Patient stated she feels better from doing ECT, she appears less anxious and she is interacting with peers and staff appropriately.  A: Patient was offered support and encouragement, was given scheduled medications and was encouraged to attend groups. 15 minute checks were done for safety.  R:Patient attends groups and interacts appropriately with peers and staff. Patient is complaint with medication, and receptive to treatment and safety maintained on unit.

## 2017-10-11 NOTE — Progress Notes (Signed)
[facility] MD Progress Note  10/11/2017 7:58 PM Angela [name]  MRN:  [id number] Subjective: Follow-up for patient with bipolar disorder.  Patient seen this evening chart reviewed.  Patient continues to show clear improvement in terms of her mental state.  Not reporting depression.  Not reporting agitation.  Not throwing tantrums or having the kind of wild mood swings she had previously.  Able to discuss things much more lucidly.  No sign of really acute dangerousness Principal Problem: Bipolar I disorder, most recent episode mixed, severe with psychotic features ([place]) Diagnosis:   Patient Active Problem List   Diagnosis Date Noted  . Bipolar I disorder, most recent episode mixed, severe with psychotic features ([place]) [F31.64] 09/18/2017  . Agitation [R45.1] 09/18/2017  . IBS (irritable bowel syndrome) [K58.9] 11/17/2016  . Bilateral carpal tunnel syndrome [G56.03] 11/17/2016  . Plantar fasciitis, bilateral [M72.2] 11/17/2016  . Chronic pain syndrome [G89.4] 11/17/2016  . Chronic hyponatremia [E87.1] 11/17/2016  . PTSD (post-traumatic stress disorder) [F43.10] 11/17/2016  . Hernia, hiatal [K44.9] 11/17/2016  . Vitamin D deficiency [E55.9] 11/17/2016  . Vitamin B12 deficiency [E53.8] 11/17/2016  . SIADH (syndrome of inappropriate ADH production) ([place]) [E22.2] 09/08/2016  . Cervical spondylosis with myelopathy [M47.12] 05/24/2016  . Post-concussion headache [G44.309] 05/24/2016  . MCI (mild cognitive impairment) with memory loss [G31.84] 03/30/2016  . Gait abnormality [R26.9] 03/30/2016  . Chronic bipolar affective disorder ([place]) [F31.9] 01/24/2016  . Anemia, iron deficiency [D50.9] 06/24/2015  . Endometriosis [N80.9] 06/24/2015  . Essential hypertension [I10] 06/24/2015  . History of postmenopausal bleeding [Z87.42] 05/22/2015  . History of TIA (transient ischemic attack) [Z86.73] 04/20/2015  . Hypothyroidism [E03.9] 04/20/2015  . GERD (gastroesophageal reflux disease) [K21.9] 04/20/2015   . Cervical disc disorder with radiculopathy [M50.10] 03/25/2015  . Chronic neck pain [M54.2, G89.29] 03/25/2015  . Pelvic pain in female [R10.2] 10/30/2014  . History of skin cancer [Z85.828] 06/30/2014  . Chronic headaches [R51] 12/16/2013  . Neuropathy [G62.9] 12/16/2013   Total Time spent with patient: 20 minutes  Past Psychiatric History: Long-standing mental health problems as documented previously  Past Medical History:  Past Medical History:  Diagnosis Date  . Allergic rhinitis   . Anemia   . Blurred vision   . Depression   . Diverticulosis   . Endometriosis   . Falls   . GERD (gastroesophageal reflux disease)   . Hernia, hiatal   . Hypothyroidism   . IBS (irritable bowel syndrome)   . Malignant neoplasm of skin   . Migraine   . Neuropathy   . PTSD (post-traumatic stress disorder)   . Thyroid disease     Past Surgical History:  Procedure Laterality Date  . APPENDECTOMY    . CHOLECYSTECTOMY    . CHOLECYSTECTOMY, LAPAROSCOPIC    . ESOPHAGOGASTRODUODENOSCOPY (EGD) WITH PROPOFOL N/A 03/29/2015   Procedure: ESOPHAGOGASTRODUODENOSCOPY (EGD) WITH PROPOFOL;  Surgeon: Josefine Class, MD;  Location[facility] ENDOSCOPY;  Service: Endoscopy;  Laterality: N/A;  . HERNIA REPAIR    . laproscopy    . TONSILLECTOMY    . uteral suspension     Family History:  Family History  Problem Relation Age of Onset  . Heart disease Father   . Cancer Mother        lung  . Urolithiasis Neg Hx   . Kidney disease Neg Hx   . Kidney cancer Neg Hx   . Prostate cancer Neg Hx    Family Psychiatric  History: The previous notes Social History:  Social History  Substance and Sexual Activity  Alcohol Use No     Social History   Substance and Sexual Activity  Drug Use No    Social History   Socioeconomic History  . Marital status: Single    Spouse name: Not on file  . Number of children: 0  . Years of education: 1.5 years of college  . Highest education level: Not on file   Occupational History  . Occupation: Retired  Scientific laboratory technician  . Financial resource strain: Not on file  . Food insecurity:    Worry: Not on file    Inability: Not on file  . Transportation needs:    Medical: Not on file    Non-medical: Not on file  Tobacco Use  . Smoking status: Former Smoker    Last attempt to quit: 11/17/1976    Years since quitting: 40.9  . Smokeless tobacco: Never Used  Substance and Sexual Activity  . Alcohol use: No  . Drug use: No  . Sexual activity: Not Currently  Lifestyle  . Physical activity:    Days per week: Not on file    Minutes per session: Not on file  . Stress: Not on file  Relationships  . Social connections:    Talks on phone: Not on file    Gets together: Not on file    Attends religious service: Not on file    Active member of club or organization: Not on file    Attends meetings of clubs or organizations: Not on file    Relationship status: Not on file  Other Topics Concern  . Not on file  Social History Narrative   Lives at home alone.   Right-handed.   No daily caffeine use.   Additional Social History:    Pain Medications: See PTA Prescriptions: See PTA Over the Counter: See PTA History of alcohol / drug use?: No history of alcohol / drug abuse Longest period of sobriety (when/how long): Reports of none Negative Consequences of Use: (n/a) Withdrawal Symptoms: (n/a)                    Sleep: Fair  Appetite:  Fair  Current Medications: Current Facility-Administered Medications  Medication Dose Route Frequency Provider Last Rate Last Dose  . acetaminophen (TYLENOL) tablet 650 mg  650 mg Oral Q6H PRN Latarsha Zani, Madie Reno, MD   650 mg at 10/08/17 2103  . albuterol (PROVENTIL) (2.5 MG/3ML) 0.083% nebulizer solution 2.5 mg  2.5 mg Inhalation Q6H PRN Tavari Loadholt T, MD      . alum & mag hydroxide-simeth (MAALOX/MYLANTA) 200-200-20 MG/5ML suspension 30 mL  30 mL Oral Q4H PRN Omair Dettmer, Madie Reno, MD   30 mL at 10/08/17 1507  .  amLODipine (NORVASC) tablet 5 mg  5 mg Oral BID Azile Minardi, Madie Reno, MD   5 mg at 10/11/17 0859  . calcium carbonate (TUMS - dosed in mg elemental calcium) chewable tablet 500 mg of elemental calcium  500 mg of elemental calcium Oral BID WC Pucilowska, Jolanta B, MD   500 mg of elemental calcium at 10/11/17 1629  . cholecalciferol (VITAMIN D) tablet 1,000 Units  1,000 Units Oral Daily Pucilowska, Jolanta B, MD   1,000 Units at 10/11/17 0858  . cycloSPORINE (RESTASIS) 0.05 % ophthalmic emulsion 1 drop  1 drop Both Eyes BID Aradia Estey, Madie Reno, MD   1 drop at 10/11/17 1727  . diclofenac sodium (VOLTAREN) 1 % transdermal gel 4 g  4 g Topical QID Pucilowska, Jolanta  B, MD   4 g at 10/11/17 1728  . docusate sodium (COLACE) capsule 100 mg  100 mg Oral BID Laurian Edrington, Madie Reno, MD   100 mg at 10/11/17 0903  . feeding supplement (BOOST / RESOURCE BREEZE) liquid 1 Container  1 Container Oral TID BM Tamryn Popko, Madie Reno, MD   1 Container at 10/11/17 0910  . fentaNYL (SUBLIMAZE) injection 25 mcg  25 mcg Intravenous Q5 min PRN Gunnar Bulla, MD      . fentaNYL (SUBLIMAZE) injection 25 mcg  25 mcg Intravenous Q5 min PRN Alvin Critchley, MD      . ferrous sulfate tablet 325 mg  325 mg Oral Q breakfast Pucilowska, Jolanta B, MD   325 mg at 10/11/17 0913  . gabapentin (NEURONTIN) capsule 100 mg  100 mg Oral BID Lexy Meininger, Madie Reno, MD   100 mg at 10/11/17 1726  . glycopyrrolate (ROBINUL) injection 0.1 mg  0.1 mg Intravenous Once Harvie Morua T, MD      . hydrocortisone cream 1 %   Topical BID Erma Raiche T, MD      . levothyroxine (SYNTHROID, LEVOTHROID) tablet 100 mcg  100 mcg Oral QAC breakfast Pucilowska, Jolanta B, MD   100 mcg at 10/11/17 0658  . lidocaine (LIDODERM) 5 % 1 patch  1 patch Transdermal Q24H Pucilowska, Jolanta B, MD   1 patch at 10/10/17 1359  . linaclotide (LINZESS) capsule 290 mcg  290 mcg Oral QAC breakfast Pucilowska, Jolanta B, MD   290 mcg at 10/11/17 0658  . lip balm (BLISTEX) ointment   Topical PRN Shacoya Burkhammer,  Mckinnley Cottier T, MD      . LORazepam (ATIVAN) tablet 0.5 mg  0.5 mg Oral Q6H PRN Kindel Rochefort, Madie Reno, MD   0.5 mg at 10/08/17 2102  . magnesium hydroxide (MILK OF MAGNESIA) suspension 30 mL  30 mL Oral Daily PRN Anija Brickner, Madie Reno, MD   30 mL at 10/05/17 1731  . magnesium oxide (MAG-OX) tablet 200 mg  200 mg Oral BID Pucilowska, Jolanta B, MD   200 mg at 10/11/17 1729  . midazolam (VERSED) injection 2 mg  2 mg Intravenous Once Laterrance Nauta T, MD      . multivitamin with minerals tablet 1 tablet  1 tablet Oral Daily Pucilowska, Jolanta B, MD   1 tablet at 10/11/17 0902  . omega-3 acid ethyl esters (LOVAZA) capsule 1 g  1 g Oral BID Pucilowska, Jolanta B, MD   1 g at 10/11/17 1730  . ondansetron (ZOFRAN) injection 4 mg  4 mg Intravenous Once Sahirah Rudell T, MD      . ondansetron [facility]) injection 4 mg  4 mg Intravenous Once PRN Gunnar Fusi, MD      . ondansetron [facility]) injection 4 mg  4 mg Intravenous Once PRN Gunnar Bulla, MD      . ondansetron [facility]) injection 4 mg  4 mg Intravenous Once PRN Alvin Critchley, MD      . ondansetron (ZOFRAN-ODT) disintegrating tablet 4 mg  4 mg Oral Q8H PRN Pucilowska, Jolanta B, MD   4 mg at 10/08/17 1807  . pantoprazole (PROTONIX) EC tablet 40 mg  40 mg Oral Daily Pucilowska, Jolanta B, MD   40 mg at 10/11/17 0658  . PARoxetine (PAXIL-CR) 24 hr tablet 25 mg  25 mg Oral Daily Pucilowska, Jolanta B, MD   25 mg at 10/11/17 0905  . polyvinyl alcohol (LIQUIFILM TEARS) 1.4 % ophthalmic solution 1 drop  1 drop Both Eyes PRN Ramond Dial,  MD   1 drop at 10/11/17 1800  . prazosin (MINIPRESS) capsule 2 mg  2 mg Oral BID Pucilowska, Jolanta B, MD   2 mg at 10/11/17 1726  . promethazine (PHENERGAN) tablet 12.5 mg  12.5 mg Oral Q6H PRN Hesper Venturella T, MD   12.5 mg at 10/08/17 1647  . pyridOXINE (VITAMIN B-6) tablet 100 mg  100 mg Oral BID Cobi Delph, Madie Reno, MD   100 mg at 10/11/17 1730  . tiZANidine (ZANAFLEX) tablet 2 mg  2 mg Oral Q8H PRN Pucilowska, Jolanta B, MD   2 mg at  10/11/17 0858  . traZODone (DESYREL) tablet 100 mg  100 mg Oral QHS PRN He, Jun, MD   100 mg at 10/07/17 2347  . traZODone (DESYREL) tablet 50 mg  50 mg Oral QHS Pucilowska, Jolanta B, MD   50 mg at 10/10/17 2208  . triamcinolone cream (KENALOG) 0.1 %   Topical TID PRN Ramond Dial, MD        Lab Results:  Results for orders placed or performed during the hospital encounter of 09/19/17 (from the past 48 hour(s))  Glucose, capillary     Status: None   Collection Time: 10/10/17  6:25 AM  Result Value Ref Range   Glucose-Capillary 97 70 - 99 mg/dL    Blood Alcohol level:  Lab Results  Component Value Date   ETH <5 09/29/2016   ETH <5 15/40/0867    Metabolic Disorder Labs: Lab Results  Component Value Date   HGBA1C 5.7 (H) 09/20/2017   MPG 116.89 09/20/2017   No results found for: PROLACTIN Lab Results  Component Value Date   CHOL 173 09/20/2017   TRIG 193 (H) 09/20/2017   HDL 50 09/20/2017   CHOLHDL 3.5 09/20/2017   VLDL 39 09/20/2017   LDLCALC 84 09/20/2017   LDLCALC 87 09/04/2017    Physical Findings: AIMS:  , ,  ,  ,    CIWA:    COWS:     Musculoskeletal: Strength & Muscle Tone: within normal limits Gait & Station: normal Patient leans: N/A  Psychiatric Specialty Exam: Physical Exam  Nursing note and vitals reviewed. Constitutional: She appears well-developed and well-nourished.  HENT:  Head: Normocephalic and atraumatic.  Eyes: Pupils are equal, round, and reactive to light. Conjunctivae are normal.  Neck: Normal range of motion.  Cardiovascular: Normal heart sounds.  Respiratory: Effort normal.  GI: Soft.  Musculoskeletal: Normal range of motion.  Neurological: She is alert.  Skin: Skin is warm and dry.  Psychiatric: She has a normal mood and affect. Her behavior is normal. Judgment and thought content normal.    Review of Systems  Constitutional: Negative.   HENT: Negative.   Eyes: Negative.   Respiratory: Negative.   Cardiovascular:  Negative.   Gastrointestinal: Negative.   Musculoskeletal: Negative.   Skin: Negative.   Neurological: Negative.   Psychiatric/Behavioral: Negative.     Blood pressure (!) 142/76, pulse 68, temperature 98.2 F (36.8 C), temperature source Oral, resp. rate 18, height 5\' 3"  (1.6 m), weight 59.9 kg, SpO2 94 %.Body mass index is 23.38 kg/m.  General Appearance: Fairly Groomed  Eye Contact:  Fair  Speech:  Normal Rate  Volume:  Normal  Mood:  Euthymic  Affect:  Congruent  Thought Process:  Goal Directed  Orientation:  Full (Time, Place, and Person)  Thought Content:  Logical  Suicidal Thoughts:  No  Homicidal Thoughts:  No  Memory:  Immediate;   Fair Recent;   Fair Remote;  Fair  Judgement:  Fair  Insight:  Fair  Psychomotor Activity:  Normal  Concentration:  Concentration: Fair  Recall:  Sunset of Knowledge:  Good  Language:  Fair  Akathisia:  No  Handed:  Right  AIMS (if indicated):     Assets:  Desire for Improvement  ADL's:  Intact  Cognition:  WNL  Sleep:  Number of Hours: 6     Treatment Plan Summary: Plan As noted previously I think the patient has clearly benefited from ECT.  I am hoping that we continue some improvement in short-term memory.  No indication to change any of her medicine right now.  We really need to just work on an appropriate discharge plan.  Alethia Berthold, MD 10/11/2017, 7:58 PM

## 2017-10-11 NOTE — [facility] Group Notes (Signed)
LCSW Group Therapy Note  10/11/2017 1:00 pm  Type of Therapy/Topic:  Group Therapy:  Balance in Life  Participation Level:  Did Not Attend  Description of Group:    This group will address the concept of balance and how it feels and looks when one is unbalanced. Patients will be encouraged to process areas in their lives that are out of balance and identify reasons for remaining unbalanced. Facilitators will guide patients in utilizing problem-solving interventions to address and correct the stressor making their life unbalanced. Understanding and applying boundaries will be explored and addressed for obtaining and maintaining a balanced life. Patients will be encouraged to explore ways to assertively make their unbalanced needs known to significant others in their lives, using other group members and facilitator for support and feedback.  Therapeutic Goals: 1. Patient will identify two or more emotions or situations they have that consume much of in their lives. 2. Patient will identify signs/triggers that life has become out of balance:  3. Patient will identify two ways to set boundaries in order to achieve balance in their lives:  4. Patient will demonstrate ability to communicate their needs through discussion and/or role plays  Summary of Patient Progress:      Therapeutic Modalities:   Cognitive Behavioral Therapy Solution-Focused Therapy Assertiveness Training  Devona Konig, LCSW 10/11/2017 3:06 PM

## 2017-10-11 NOTE — Plan of Care (Signed)
Patients affect is brighter today.No irritable behaviors noted.Stated that she does not want to go to a group home but to assisted living and take care of her health issues.Patient verbalized her somatic issues as usual.Appetite and energy level good.Support and encouragement given.

## 2017-10-11 NOTE — Progress Notes (Signed)
Recreation Therapy Notes  Date: 10/11/2017  Time: 9:30 am   Location: Craft room   Behavioral response: N/A   Intervention Topic: Communication  Discussion/Intervention: Patient did not attend group.   Clinical Observations/Feedback:  Patient did not attend group.   Wilborn Membreno LRT/CTRS        Randal Goens 10/11/2017 10:56 AM

## 2017-10-11 NOTE — Plan of Care (Signed)
  Problem: Education: Goal: Ability to make informed decisions regarding treatment will improve Outcome: Progressing  Patient has ability to make informed decisions

## 2017-10-11 NOTE — Anesthesia Postprocedure Evaluation (Signed)
Anesthesia Post Note  Patient: Angela [name]  Procedure(s) Performed: ECT TX  Patient location during evaluation: PACU Anesthesia Type: General Level of consciousness: awake and alert Pain management: pain level controlled Vital Signs Assessment: post-procedure vital signs reviewed and stable Respiratory status: spontaneous breathing, nonlabored ventilation, respiratory function stable and patient connected to nasal cannula oxygen Cardiovascular status: blood pressure returned to baseline and stable Postop Assessment: no apparent nausea or vomiting Anesthetic complications: no     Last Vitals:  Vitals:   10/10/17 2328 10/11/17 0656  BP: 110/63 112/75  Pulse: 65 (!) 50  Resp:  18  Temp:  36.8 C  SpO2: 95% 94%    Last Pain:  Vitals:   10/11/17 0900  TempSrc:   PainSc: 0-No pain                 Martha Clan

## 2017-10-12 MED ORDER — PRAZOSIN HCL 2 MG PO CAPS
2.0000 mg | ORAL_CAPSULE | Freq: Every day | ORAL | Status: DC
  Administered 2017-10-13 – 2017-11-05 (×22): 2 mg via ORAL
  Filled 2017-10-12 (×25): qty 1

## 2017-10-12 MED ORDER — AMLODIPINE BESYLATE 5 MG PO TABS
2.5000 mg | ORAL_TABLET | Freq: Two times a day (BID) | ORAL | Status: DC
  Administered 2017-10-12 – 2017-10-22 (×18): 2.5 mg via ORAL
  Filled 2017-10-12 (×17): qty 1

## 2017-10-12 NOTE — Progress Notes (Signed)
Chaplain was paged because Pt wanted to talk. Pt talked about her 7 deaths and the disconnection with family and her lost of self. She talked about how [facility] turned everyone against her and her sense of anger. Chaplin explored her own sense of lost. Her deaths were people that were important to her(Bill, Her dad and 2 other names). We explored what [name] meant. Why was she named [name]. She though her name meant ugly. She said that went back  A time when she fail at dance. Chaplain encourage her to try to retry that dance step again and do it right (to her approval).    10/12/17 1500  Clinical Encounter Type  Visited With Patient  Visit Type Spiritual support  Referral From Nurse  Spiritual Encounters  Spiritual Needs Prayer

## 2017-10-12 NOTE — Progress Notes (Signed)
Patient ID: Angela [name], female   DOB: 06-12-1950, 67 y.o.   MRN: [id number] CSW met with pt at her request to engage in discharge planning.  Pt informed CSW that she would like to have surgery on her right ear (by Dr. Darliss Cheney, MD with [facility]), go to rehab, then go to an assisted living facility in [place], [place].  Pt asked CSW if she could get her the telephone number for Dr. Amado Coe (which CSW provided to her).  Pt also asked CSW if she would contact the [facility] and inquire about the money in her trust fund.  CSW informed pt that she would not contact this agency, but would be happy to get her the number for her to contact them and inquire about the money in her trust fund.  Pt informed CSW that she had the number and would contact them.  Pt also asked CSW if she would contact Darleene Cleaver with [employer] about assisting with housing and to ask if he could come visit with her.  CSW informed pt that she would assist her with obtaining Mr. Harrison's telephone number and that she would contact him.  CSW obtained Mr. Ruthe Mannan contact information Babs Bertin at [phone]) and get it to pt. Pt asked CSW what was she going to do to assist her with housing.  CSW informed pt that she would need to obtain information about her money/funding and CSW would be happy to assist her with identifying ALF in the [facility] area and inquiring about bed availability.  CSW could assist pt with getting all the documentation to the ALF when one was identified and agreed to accept her.  CSW informed pt that it would be beneficial for her to discuss this plan with her attending psychiatrist, Dr. Weber Cooks as he has to agree with allowing her to remain in the [facility] until an ALF is secured.  CSW also discussed with pt the fact that an ALF would most likely not "hold a bed" for her until she gets out of rehab unless she is going to pay for the bed.   CSW  informed pt that she would ask CSW Dossie Arbour to check in  with her next week to continue discharge planning and try to get a mtg arranged with Dr. Weber Cooks to discuss her discharge plan.

## 2017-10-12 NOTE — Progress Notes (Signed)
[facility] MD Progress Note  10/12/2017 12:50 PM Angela [name]  MRN:  [id number] Subjective: Follow-up note for this woman with bipolar disorder chronic mood instability.  Patient seen chart reviewed.  Patient has no new complaints today.  Continues to endorse that her mood is feeling pretty good.  Denies a return of major depression.  Although she is still intrusive and at times somewhat inappropriate she is not psychotic and I think her major mood symptoms are under control.  Physically it was pointed out to me that she has been running low blood pressure and on reviewing her antidepressants she was getting 2 different antidepressants twice a day.  I have cut the dose of amlodipine and stop to the Minipress in the morning.  Patient understands that she needs to be working on discharge planning right now.   Principal Problem: Bipolar I disorder, most recent episode mixed, severe with psychotic features ([place]) Diagnosis:   Patient Active Problem List   Diagnosis Date Noted  . Bipolar I disorder, most recent episode mixed, severe with psychotic features ([place]) [F31.64] 09/18/2017  . Agitation [R45.1] 09/18/2017  . IBS (irritable bowel syndrome) [K58.9] 11/17/2016  . Bilateral carpal tunnel syndrome [G56.03] 11/17/2016  . Plantar fasciitis, bilateral [M72.2] 11/17/2016  . Chronic pain syndrome [G89.4] 11/17/2016  . Chronic hyponatremia [E87.1] 11/17/2016  . PTSD (post-traumatic stress disorder) [F43.10] 11/17/2016  . Hernia, hiatal [K44.9] 11/17/2016  . Vitamin D deficiency [E55.9] 11/17/2016  . Vitamin B12 deficiency [E53.8] 11/17/2016  . SIADH (syndrome of inappropriate ADH production) ([place]) [E22.2] 09/08/2016  . Cervical spondylosis with myelopathy [M47.12] 05/24/2016  . Post-concussion headache [G44.309] 05/24/2016  . MCI (mild cognitive impairment) with memory loss [G31.84] 03/30/2016  . Gait abnormality [R26.9] 03/30/2016  . Chronic bipolar affective disorder ([place]) [F31.9] 01/24/2016  .  Anemia, iron deficiency [D50.9] 06/24/2015  . Endometriosis [N80.9] 06/24/2015  . Essential hypertension [I10] 06/24/2015  . History of postmenopausal bleeding [Z87.42] 05/22/2015  . History of TIA (transient ischemic attack) [Z86.73] 04/20/2015  . Hypothyroidism [E03.9] 04/20/2015  . GERD (gastroesophageal reflux disease) [K21.9] 04/20/2015  . Cervical disc disorder with radiculopathy [M50.10] 03/25/2015  . Chronic neck pain [M54.2, G89.29] 03/25/2015  . Pelvic pain in female [R10.2] 10/30/2014  . History of skin cancer [Z85.828] 06/30/2014  . Chronic headaches [R51] 12/16/2013  . Neuropathy [G62.9] 12/16/2013   Total Time spent with patient: 20 minutes  Past Psychiatric History: Long history of mood disorder multiple hospitalizations  Past Medical History:  Past Medical History:  Diagnosis Date  . Allergic rhinitis   . Anemia   . Blurred vision   . Depression   . Diverticulosis   . Endometriosis   . Falls   . GERD (gastroesophageal reflux disease)   . Hernia, hiatal   . Hypothyroidism   . IBS (irritable bowel syndrome)   . Malignant neoplasm of skin   . Migraine   . Neuropathy   . PTSD (post-traumatic stress disorder)   . Thyroid disease     Past Surgical History:  Procedure Laterality Date  . APPENDECTOMY    . CHOLECYSTECTOMY    . CHOLECYSTECTOMY, LAPAROSCOPIC    . ESOPHAGOGASTRODUODENOSCOPY (EGD) WITH PROPOFOL N/A 03/29/2015   Procedure: ESOPHAGOGASTRODUODENOSCOPY (EGD) WITH PROPOFOL;  Surgeon: Josefine Class, MD;  Location: [facility] ENDOSCOPY;  Service: Endoscopy;  Laterality: N/A;  . HERNIA REPAIR    . laproscopy    . TONSILLECTOMY    . uteral suspension     Family History:  Family History  Problem Relation  Age of Onset  . Heart disease Father   . Cancer Mother        lung  . Urolithiasis Neg Hx   . Kidney disease Neg Hx   . Kidney cancer Neg Hx   . Prostate cancer Neg Hx    Family Psychiatric  History: See previous note Social History:  Social  History   Substance and Sexual Activity  Alcohol Use No     Social History   Substance and Sexual Activity  Drug Use No    Social History   Socioeconomic History  . Marital status: Single    Spouse name: Not on file  . Number of children: 0  . Years of education: 1.5 years of college  . Highest education level: Not on file  Occupational History  . Occupation: Retired  Scientific laboratory technician  . Financial resource strain: Not on file  . Food insecurity:    Worry: Not on file    Inability: Not on file  . Transportation needs:    Medical: Not on file    Non-medical: Not on file  Tobacco Use  . Smoking status: Former Smoker    Last attempt to quit: 11/17/1976    Years since quitting: 40.9  . Smokeless tobacco: Never Used  Substance and Sexual Activity  . Alcohol use: No  . Drug use: No  . Sexual activity: Not Currently  Lifestyle  . Physical activity:    Days per week: Not on file    Minutes per session: Not on file  . Stress: Not on file  Relationships  . Social connections:    Talks on phone: Not on file    Gets together: Not on file    Attends religious service: Not on file    Active member of club or organization: Not on file    Attends meetings of clubs or organizations: Not on file    Relationship status: Not on file  Other Topics Concern  . Not on file  Social History Narrative   Lives at home alone.   Right-handed.   No daily caffeine use.   Additional Social History:    Pain Medications: See PTA Prescriptions: See PTA Over the Counter: See PTA History of alcohol / drug use?: No history of alcohol / drug abuse Longest period of sobriety (when/how long): Reports of none Negative Consequences of Use: (n/a) Withdrawal Symptoms: (n/a)                    Sleep: Fair  Appetite:  Good  Current Medications: Current Facility-Administered Medications  Medication Dose Route Frequency Provider Last Rate Last Dose  . acetaminophen (TYLENOL) tablet 650 mg   650 mg Oral Q6H PRN Joyous Gleghorn, Madie Reno, MD   650 mg at 10/08/17 2103  . albuterol (PROVENTIL) (2.5 MG/3ML) 0.083% nebulizer solution 2.5 mg  2.5 mg Inhalation Q6H PRN Abdirizak Richison T, MD      . alum & mag hydroxide-simeth (MAALOX/MYLANTA) 200-200-20 MG/5ML suspension 30 mL  30 mL Oral Q4H PRN Tamma Brigandi, Madie Reno, MD   30 mL at 10/08/17 1507  . amLODipine (NORVASC) tablet 2.5 mg  2.5 mg Oral BID Riata Ikeda T, MD   2.5 mg at 10/12/17 1002  . calcium carbonate (TUMS - dosed in mg elemental calcium) chewable tablet 500 mg of elemental calcium  500 mg of elemental calcium Oral BID WC Pucilowska, Jolanta B, MD   500 mg of elemental calcium at 10/12/17 1004  . cholecalciferol (VITAMIN D)  tablet 1,000 Units  1,000 Units Oral Daily Pucilowska, Jolanta B, MD   1,000 Units at 10/12/17 1001  . cycloSPORINE (RESTASIS) 0.05 % ophthalmic emulsion 1 drop  1 drop Both Eyes BID Kerri Kovacik, Madie Reno, MD   1 drop at 10/11/17 1727  . diclofenac sodium (VOLTAREN) 1 % transdermal gel 4 g  4 g Topical QID Pucilowska, Jolanta B, MD   4 g at 10/12/17 1011  . docusate sodium (COLACE) capsule 100 mg  100 mg Oral BID Vauda Salvucci T, MD   100 mg at 10/12/17 1003  . feeding supplement (BOOST / RESOURCE BREEZE) liquid 1 Container  1 Container Oral TID BM Rhodes Calvert, Madie Reno, MD   1 Container at 10/11/17 0910  . fentaNYL (SUBLIMAZE) injection 25 mcg  25 mcg Intravenous Q5 min PRN Gunnar Bulla, MD      . fentaNYL (SUBLIMAZE) injection 25 mcg  25 mcg Intravenous Q5 min PRN Alvin Critchley, MD      . ferrous sulfate tablet 325 mg  325 mg Oral Q breakfast Pucilowska, Jolanta B, MD   325 mg at 10/12/17 1001  . gabapentin (NEURONTIN) capsule 100 mg  100 mg Oral BID Kathye Cipriani T, MD   100 mg at 10/12/17 1001  . glycopyrrolate (ROBINUL) injection 0.1 mg  0.1 mg Intravenous Once Adilene Areola T, MD      . hydrocortisone cream 1 %   Topical BID TRUE Shackleford T, MD      . levothyroxine (SYNTHROID, LEVOTHROID) tablet 100 mcg  100 mcg Oral QAC breakfast  Pucilowska, Jolanta B, MD   100 mcg at 10/12/17 1497  . lidocaine (LIDODERM) 5 % 1 patch  1 patch Transdermal Q24H Pucilowska, Jolanta B, MD   1 patch at 10/10/17 1359  . linaclotide (LINZESS) capsule 290 mcg  290 mcg Oral QAC breakfast Pucilowska, Jolanta B, MD   290 mcg at 10/12/17 0263  . lip balm (BLISTEX) ointment   Topical PRN Colette Dicamillo T, MD      . LORazepam (ATIVAN) tablet 0.5 mg  0.5 mg Oral Q6H PRN Kenise Barraco, Madie Reno, MD   0.5 mg at 10/08/17 2102  . magnesium hydroxide (MILK OF MAGNESIA) suspension 30 mL  30 mL Oral Daily PRN Brennan Karam, Madie Reno, MD   30 mL at 10/05/17 1731  . magnesium oxide (MAG-OX) tablet 200 mg  200 mg Oral BID Pucilowska, Jolanta B, MD   200 mg at 10/12/17 1005  . midazolam (VERSED) injection 2 mg  2 mg Intravenous Once Porchea Charrier T, MD      . multivitamin with minerals tablet 1 tablet  1 tablet Oral Daily Pucilowska, Jolanta B, MD   1 tablet at 10/12/17 1001  . omega-3 acid ethyl esters (LOVAZA) capsule 1 g  1 g Oral BID Pucilowska, Jolanta B, MD   1 g at 10/12/17 1003  . ondansetron (ZOFRAN) injection 4 mg  4 mg Intravenous Once Zayin Valadez T, MD      . ondansetron [facility]) injection 4 mg  4 mg Intravenous Once PRN Gunnar Fusi, MD      . ondansetron [facility]) injection 4 mg  4 mg Intravenous Once PRN Gunnar Bulla, MD      . ondansetron [facility]) injection 4 mg  4 mg Intravenous Once PRN Alvin Critchley, MD      . ondansetron (ZOFRAN-ODT) disintegrating tablet 4 mg  4 mg Oral Q8H PRN Pucilowska, Jolanta B, MD   4 mg at 10/08/17 1807  . pantoprazole (PROTONIX) EC  tablet 40 mg  40 mg Oral Daily Pucilowska, Jolanta B, MD   40 mg at 10/12/17 4696  . PARoxetine (PAXIL-CR) 24 hr tablet 25 mg  25 mg Oral Daily Pucilowska, Jolanta B, MD   25 mg at 10/12/17 1008  . polyvinyl alcohol (LIQUIFILM TEARS) 1.4 % ophthalmic solution 1 drop  1 drop Both Eyes PRN Ramond Dial, MD   1 drop at 10/12/17 1012  . [START ON 10/13/2017] prazosin (MINIPRESS) capsule 2 mg  2 mg  Oral QHS Shaunette Gassner T, MD      . promethazine (PHENERGAN) tablet 12.5 mg  12.5 mg Oral Q6H PRN Shakena Callari T, MD   12.5 mg at 10/08/17 1647  . pyridOXINE (VITAMIN B-6) tablet 100 mg  100 mg Oral BID Harald Quevedo T, MD   100 mg at 10/12/17 1009  . tiZANidine (ZANAFLEX) tablet 2 mg  2 mg Oral Q8H PRN Pucilowska, Jolanta B, MD   2 mg at 10/11/17 0858  . traZODone (DESYREL) tablet 100 mg  100 mg Oral QHS PRN He, Jun, MD   100 mg at 10/11/17 2319  . traZODone (DESYREL) tablet 50 mg  50 mg Oral QHS Pucilowska, Jolanta B, MD   50 mg at 10/11/17 2200  . triamcinolone cream (KENALOG) 0.1 %   Topical TID PRN Ramond Dial, MD        Lab Results: No results found for this or any previous visit (from the past 48 hour(s)).  Blood Alcohol level:  Lab Results  Component Value Date   ETH <5 09/29/2016   ETH <5 29/52/8413    Metabolic Disorder Labs: Lab Results  Component Value Date   HGBA1C 5.7 (H) 09/20/2017   MPG 116.89 09/20/2017   No results found for: PROLACTIN Lab Results  Component Value Date   CHOL 173 09/20/2017   TRIG 193 (H) 09/20/2017   HDL 50 09/20/2017   CHOLHDL 3.5 09/20/2017   VLDL 39 09/20/2017   LDLCALC 84 09/20/2017   LDLCALC 87 09/04/2017    Physical Findings: AIMS:  , ,  ,  ,    CIWA:    COWS:     Musculoskeletal: Strength & Muscle Tone: within normal limits Gait & Station: normal Patient leans: N/A  Psychiatric Specialty Exam: Physical Exam  Nursing note and vitals reviewed. Constitutional: She appears well-developed and well-nourished.  HENT:  Head: Normocephalic and atraumatic.  Eyes: Pupils are equal, round, and reactive to light. Conjunctivae are normal.  Neck: Normal range of motion.  Cardiovascular: Regular rhythm and normal heart sounds.  Respiratory: Effort normal.  GI: Soft.  Musculoskeletal: Normal range of motion.  Neurological: She is alert.  Skin: Skin is warm and dry.  Psychiatric: She has a normal mood and affect. Her speech  is normal and behavior is normal. Thought content normal. She expresses impulsivity. She exhibits abnormal recent memory.    Review of Systems  Constitutional: Negative.   HENT: Negative.   Eyes: Negative.   Respiratory: Negative.   Cardiovascular: Negative.   Gastrointestinal: Negative.   Musculoskeletal: Negative.   Skin: Negative.   Neurological: Negative.   Psychiatric/Behavioral: Negative.     Blood pressure (!) 141/86, pulse (!) 59, temperature (!) 97.5 F (36.4 C), temperature source Oral, resp. rate 18, height 5\' 3"  (1.6 m), weight 59.9 kg, SpO2 97 %.Body mass index is 23.38 kg/m.  General Appearance: Fairly Groomed  Eye Contact:  Good  Speech:  Clear and Coherent  Volume:  Normal  Mood:  Euthymic  Affect:  Congruent  Thought Process:  Coherent  Orientation:  Full (Time, Place, and Person)  Thought Content:  Logical and Tangential  Suicidal Thoughts:  No  Homicidal Thoughts:  No  Memory:  Immediate;   Fair Recent;   Fair Remote;   Fair  Judgement:  Fair  Insight:  Fair  Psychomotor Activity:  Decreased  Concentration:  Concentration: Fair  Recall:  [name]:  Fair  Language:  Fair  Akathisia:  Negative  Handed:  Right  AIMS (if indicated):     Assets:  Desire for Improvement Resilience  ADL's:  Intact  Cognition:  Impaired,  Mild  Sleep:  Number of Hours: 1.5     Treatment Plan Summary: Daily contact with patient to assess and evaluate symptoms and progress in treatment, Medication management and Plan Patient with long-standing mood disorder.  Worst of her symptoms seem to have improved quite a bit with ECT.  We are holding off on ECT until next Wednesday at the earliest.  Only change in medicine today is to cut down on the blood pressure medicines as noted above.  Patient and I engaged in conversation with psychoeducation and a focus on discharge planning.  Patient has stated a preference to go back to [facility] because it would be closer to her  upcoming medical care.  I completely support that.  Reviewed with treatment team we should be focusing on trying to find her a place to stay.  A question arose as to whether it would be more appropriate for her to have independent living or in a group setting.  There are arguments to be made either way but she has consistently had problems in a group setting with ultimately having a decompensation of her personality disorder behavior resulting in hospitalizations.  Therefore it might be better at this point to go with her preference and choose independent living.  Alethia Berthold, MD 10/12/2017, 12:50 PM

## 2017-10-12 NOTE — Progress Notes (Signed)
Recreation Therapy Notes  Date: 10/12/2017  Time: 9:30 am   Location: Craft room   Behavioral response: N/A   Intervention Topic: Stress  Discussion/Intervention: Patient did not attend group.   Clinical Observations/Feedback:  Patient did not attend group.   [name] LRT/CTRS         Niki Cosman 10/12/2017 10:25 AM

## 2017-10-12 NOTE — Plan of Care (Signed)
  Problem: Education: Goal: Ability to make informed decisions regarding treatment will improve Outcome: Progressing  Patient can make informed decisions regarding treatment

## 2017-10-12 NOTE — Progress Notes (Signed)
Patient is alert and oriented x 4, no distress noted, thoughts are organized and coherent, interacting appropriately with peers and staff, affect is brighter, she's less irritable and less intrusive, very pleasant and polite with peers and staff. Patient does not have ECT tomorrow per MD she is aware. Patient visible in milieu, didn't attend evening wrap up group, compliant with medication. 15 minutes safety checks maintained, will continue to monitor.

## 2017-10-12 NOTE — Plan of Care (Signed)
Patient was pleasant and cooperative most of the shift.In the afternoon patient was very emotional,talked about her losses and traumatic events in life.Called Chaplin for the patient.After talking to the chaplin patient states "he made me a new person."Patient visible in the milieu,no issues verbalized.

## 2017-10-12 NOTE — [facility] Group Notes (Signed)
10/12/2017 1PM  Type of Therapy and Topic:  Group Therapy:  Feelings around Relapse and Recovery  Participation Level:  Active   Description of Group:    Patients in this group will discuss emotions they experience before and after a relapse. They will process how experiencing these feelings, or avoidance of experiencing them, relates to having a relapse. Facilitator will guide patients to explore emotions they have related to recovery. Patients will be encouraged to process which emotions are more powerful. They will be guided to discuss the emotional reaction significant others in their lives may have to patients' relapse or recovery. Patients will be assisted in exploring ways to respond to the emotions of others without this contributing to a relapse.  Therapeutic Goals: 1. Patient will identify two or more emotions that lead to a relapse for them 2. Patient will identify two emotions that result when they relapse 3. Patient will identify two emotions related to recovery 4. Patient will demonstrate ability to communicate their needs through discussion and/or role plays   Summary of Patient Progress: Actively and appropriately engaged in the group. Patient was able to provide support and validation to other group members.Patient practiced active listening when interacting with the facilitator and other group members. Patient is still in the process of obtaining treatment goals.      Therapeutic Modalities:   Cognitive Behavioral Therapy Solution-Focused Therapy Assertiveness Training Relapse Prevention Therapy   Darin Engels, [place] 10/12/2017 2:22 PM

## 2017-10-13 NOTE — Progress Notes (Signed)
D: Patient stated she  Had diarrhea today but has not informed  Writer of such or showed  Probation officer. Patient stated she needed a physical therapy consult for  For her wrists.  stated she felt sick today , but has been got  up at  eleven o'clock  for her morning medications . Appetite is good and energy level  Is normal. Stated concentration is good . Stated on Depression scale , hopeless and anxiety .( low 0-10 high) Denies suicidal  homicidal ideations  .  No auditory hallucinations  No pain concerns . Appropriate ADL'S. Interacting with peers and staff. Verbalize understanding of information received  concerning medications. Patient  aware of information received from  staff referring to  education about unit programing .  Able to verbalize understanding. No Attending unit programing , verbalizing  wanting  physical therapy to work with her on her wrists Voice no concerns around  sleep.  Voice no safety concerns .  Emotional and mental health status improving . Unable to make decision about  noted to deflect on other concerns   Placement  remains a  big concern, refusing group home placement . Visting  With friend   A: Encourage patient participation with unit programming . Instruction  Given on  Medication , verbalize understanding.  R: Voice no other concerns. Staff continue to monitor

## 2017-10-13 NOTE — Plan of Care (Signed)
Pt. Complaint with medications. Pt. Denies SI/HI, verbally able to contract for safety.    Problem: Medication: Goal: Compliance with prescribed medication regimen will improve Outcome: Progressing   Problem: Self-Concept: Goal: Ability to disclose and discuss suicidal ideas will improve Outcome: Progressing

## 2017-10-13 NOTE — [facility] Group Notes (Signed)
LCSW Group Therapy Note  10/13/2017 1:15pm  Type of Therapy and Topic:  Group Therapy:  Cognitive Distortions  Participation Level:  Did Not Attend   Description of Group:    Patients in this group will be introduced to the topic of cognitive distortions.  Patients will identify and describe cognitive distortions, describe the feelings these distortions create for them.  Patients will identify one or more situations in their personal life where they have cognitively distorted thinking and will verbalize challenging this cognitive distortion through positive thinking skills.  Patients will practice the skill of using positive affirmations to challenge cognitive distortions using affirmation cards.    Therapeutic Goals:  1. Patient will identify two or more cognitive distortions they have used 2. Patient will identify one or more emotions that stem from use of a cognitive distortion 3. Patient will demonstrate use of a positive affirmation to counter a cognitive distortion through discussion and/or role play. 4. Patient will describe one way cognitive distortions can be detrimental to wellness   Summary of Patient Progress: Pt was invited to attend group but chose not to attend. CSW will continue to encourage pt to attend group throughout their admission.      Therapeutic Modalities:   Cognitive Behavioral Therapy Motivational Interviewing   Eisha Chatterjee  [name], LCSW 10/13/2017 12:35 PM   

## 2017-10-13 NOTE — Progress Notes (Addendum)
D: Patient is alert, oriented, and cooperative. Patient can be irritable. Patient affect is flat. Patient mood overall is liable. Patient denies anxiety and depression. Patient denies SI, HI, AVH, and verbally contracts for safety. Patient is somatic and complains of many physical problems. Patient complained to this RN of "high fever". Patient was afebrile upon assessment of vital signs.    A: Scheduled medications administered per MD order. Support and reassurance provided. Routine safety checks every 15 minutes. Patient stated understanding to tell nurse about any new physical symptoms she experiences. Patient understands to tell staff if she needs anything.    R: No adverse drug reactions noted. Patient verbally contracts for safety. Patient remains safe at this time and will continue to monitor.

## 2017-10-13 NOTE — Progress Notes (Signed)
[facility] MD Progress Note  10/13/2017 2:53 PM Angela [name]  MRN:  [id number] Subjective:  Tired" Pt anxious, reportedly somatic, slightly irritable, denies Si/HI, Denies AVH.    Principal Problem: Bipolar I disorder, most recent episode mixed, severe with psychotic features ([place]) Diagnosis:   Patient Active Problem List   Diagnosis Date Noted  . Bipolar I disorder, most recent episode mixed, severe with psychotic features ([place]) [F31.64] 09/18/2017  . Agitation [R45.1] 09/18/2017  . IBS (irritable bowel syndrome) [K58.9] 11/17/2016  . Bilateral carpal tunnel syndrome [G56.03] 11/17/2016  . Plantar fasciitis, bilateral [M72.2] 11/17/2016  . Chronic pain syndrome [G89.4] 11/17/2016  . Chronic hyponatremia [E87.1] 11/17/2016  . PTSD (post-traumatic stress disorder) [F43.10] 11/17/2016  . Hernia, hiatal [K44.9] 11/17/2016  . Vitamin D deficiency [E55.9] 11/17/2016  . Vitamin B12 deficiency [E53.8] 11/17/2016  . SIADH (syndrome of inappropriate ADH production) ([place]) [E22.2] 09/08/2016  . Cervical spondylosis with myelopathy [M47.12] 05/24/2016  . Post-concussion headache [G44.309] 05/24/2016  . MCI (mild cognitive impairment) with memory loss [G31.84] 03/30/2016  . Gait abnormality [R26.9] 03/30/2016  . Chronic bipolar affective disorder ([place]) [F31.9] 01/24/2016  . Anemia, iron deficiency [D50.9] 06/24/2015  . Endometriosis [N80.9] 06/24/2015  . Essential hypertension [I10] 06/24/2015  . History of postmenopausal bleeding [Z87.42] 05/22/2015  . History of TIA (transient ischemic attack) [Z86.73] 04/20/2015  . Hypothyroidism [E03.9] 04/20/2015  . GERD (gastroesophageal reflux disease) [K21.9] 04/20/2015  . Cervical disc disorder with radiculopathy [M50.10] 03/25/2015  . Chronic neck pain [M54.2, G89.29] 03/25/2015  . Pelvic pain in female [R10.2] 10/30/2014  . History of skin cancer [Z85.828] 06/30/2014  . Chronic headaches [R51] 12/16/2013  . Neuropathy [G62.9] 12/16/2013    Total Time spent with patient: 25 min  Past Psychiatric History: Long history of mood disorder multiple hospitalizations  Past Medical History:  Past Medical History:  Diagnosis Date  . Allergic rhinitis   . Anemia   . Blurred vision   . Depression   . Diverticulosis   . Endometriosis   . Falls   . GERD (gastroesophageal reflux disease)   . Hernia, hiatal   . Hypothyroidism   . IBS (irritable bowel syndrome)   . Malignant neoplasm of skin   . Migraine   . Neuropathy   . PTSD (post-traumatic stress disorder)   . Thyroid disease     Past Surgical History:  Procedure Laterality Date  . APPENDECTOMY    . CHOLECYSTECTOMY    . CHOLECYSTECTOMY, LAPAROSCOPIC    . ESOPHAGOGASTRODUODENOSCOPY (EGD) WITH PROPOFOL N/A 03/29/2015   Procedure: ESOPHAGOGASTRODUODENOSCOPY (EGD) WITH PROPOFOL;  Surgeon: Josefine Class, MD;  Location: [facility] ENDOSCOPY;  Service: Endoscopy;  Laterality: N/A;  . HERNIA REPAIR    . laproscopy    . TONSILLECTOMY    . uteral suspension     Family History:  Family History  Problem Relation Age of Onset  . Heart disease Father   . Cancer Mother        lung  . Urolithiasis Neg Hx   . Kidney disease Neg Hx   . Kidney cancer Neg Hx   . Prostate cancer Neg Hx    Family Psychiatric  History: See previous note Social History:  Social History   Substance and Sexual Activity  Alcohol Use No     Social History   Substance and Sexual Activity  Drug Use No    Social History   Socioeconomic History  . Marital status: Single    Spouse name: Not on file  . Number  of children: 0  . Years of education: 1.5 years of college  . Highest education level: Not on file  Occupational History  . Occupation: Retired  Scientific laboratory technician  . Financial resource strain: Not on file  . Food insecurity:    Worry: Not on file    Inability: Not on file  . Transportation needs:    Medical: Not on file    Non-medical: Not on file  Tobacco Use  . Smoking status: Former  Smoker    Last attempt to quit: 11/17/1976    Years since quitting: 40.9  . Smokeless tobacco: Never Used  Substance and Sexual Activity  . Alcohol use: No  . Drug use: No  . Sexual activity: Not Currently  Lifestyle  . Physical activity:    Days per week: Not on file    Minutes per session: Not on file  . Stress: Not on file  Relationships  . Social connections:    Talks on phone: Not on file    Gets together: Not on file    Attends religious service: Not on file    Active member of club or organization: Not on file    Attends meetings of clubs or organizations: Not on file    Relationship status: Not on file  Other Topics Concern  . Not on file  Social History Narrative   Lives at home alone.   Right-handed.   No daily caffeine use.   Additional Social History:    Pain Medications: See PTA Prescriptions: See PTA Over the Counter: See PTA History of alcohol / drug use?: No history of alcohol / drug abuse Longest period of sobriety (when/how long): Reports of none Negative Consequences of Use: (n/a) Withdrawal Symptoms: (n/a)                    Sleep: Fair  Appetite:  Good  Current Medications: Current Facility-Administered Medications  Medication Dose Route Frequency Provider Last Rate Last Dose  . acetaminophen (TYLENOL) tablet 650 mg  650 mg Oral Q6H PRN Clapacs, Madie Reno, MD   650 mg at 10/13/17 1444  . albuterol (PROVENTIL) (2.5 MG/3ML) 0.083% nebulizer solution 2.5 mg  2.5 mg Inhalation Q6H PRN Clapacs, John T, MD      . alum & mag hydroxide-simeth (MAALOX/MYLANTA) 200-200-20 MG/5ML suspension 30 mL  30 mL Oral Q4H PRN Clapacs, Madie Reno, MD   30 mL at 10/08/17 1507  . amLODipine (NORVASC) tablet 2.5 mg  2.5 mg Oral BID Clapacs, Madie Reno, MD   2.5 mg at 10/13/17 1215  . calcium carbonate (TUMS - dosed in mg elemental calcium) chewable tablet 500 mg of elemental calcium  500 mg of elemental calcium Oral BID WC Pucilowska, Jolanta B, MD   500 mg of elemental  calcium at 10/12/17 1736  . cholecalciferol (VITAMIN D) tablet 1,000 Units  1,000 Units Oral Daily Pucilowska, Jolanta B, MD   1,000 Units at 10/13/17 1217  . cycloSPORINE (RESTASIS) 0.05 % ophthalmic emulsion 1 drop  1 drop Both Eyes BID Clapacs, Madie Reno, MD   1 drop at 10/13/17 1213  . diclofenac sodium (VOLTAREN) 1 % transdermal gel 4 g  4 g Topical QID Pucilowska, Jolanta B, MD   4 g at 10/13/17 1217  . docusate sodium (COLACE) capsule 100 mg  100 mg Oral BID Clapacs, Madie Reno, MD   100 mg at 10/12/17 1733  . feeding supplement (BOOST / RESOURCE BREEZE) liquid 1 Container  1 Container Oral TID  BM Clapacs, Madie Reno, MD   1 Container at 10/12/17 2059  . fentaNYL (SUBLIMAZE) injection 25 mcg  25 mcg Intravenous Q5 min PRN Gunnar Bulla, MD      . fentaNYL (SUBLIMAZE) injection 25 mcg  25 mcg Intravenous Q5 min PRN Alvin Critchley, MD      . ferrous sulfate tablet 325 mg  325 mg Oral Q breakfast Pucilowska, Jolanta B, MD   325 mg at 10/13/17 1218  . gabapentin (NEURONTIN) capsule 100 mg  100 mg Oral BID Clapacs, Madie Reno, MD   100 mg at 10/13/17 1219  . glycopyrrolate (ROBINUL) injection 0.1 mg  0.1 mg Intravenous Once Clapacs, John T, MD      . hydrocortisone cream 1 %   Topical BID Clapacs, John T, MD      . levothyroxine (SYNTHROID, LEVOTHROID) tablet 100 mcg  100 mcg Oral QAC breakfast Pucilowska, Jolanta B, MD   100 mcg at 10/13/17 0629  . lidocaine (LIDODERM) 5 % 1 patch  1 patch Transdermal Q24H Pucilowska, Jolanta B, MD   1 patch at 10/10/17 1359  . linaclotide (LINZESS) capsule 290 mcg  290 mcg Oral QAC breakfast Pucilowska, Jolanta B, MD   290 mcg at 10/13/17 0629  . lip balm (BLISTEX) ointment   Topical PRN Clapacs, John T, MD      . LORazepam (ATIVAN) tablet 0.5 mg  0.5 mg Oral Q6H PRN Clapacs, Madie Reno, MD   0.5 mg at 10/08/17 2102  . magnesium hydroxide (MILK OF MAGNESIA) suspension 30 mL  30 mL Oral Daily PRN Clapacs, Madie Reno, MD   30 mL at 10/05/17 1731  . magnesium oxide (MAG-OX) tablet 200  mg  200 mg Oral BID Pucilowska, Jolanta B, MD   200 mg at 10/13/17 1220  . midazolam (VERSED) injection 2 mg  2 mg Intravenous Once Clapacs, John T, MD      . multivitamin with minerals tablet 1 tablet  1 tablet Oral Daily Pucilowska, Jolanta B, MD   1 tablet at 10/13/17 1221  . omega-3 acid ethyl esters (LOVAZA) capsule 1 g  1 g Oral BID Pucilowska, Jolanta B, MD   1 g at 10/13/17 1221  . ondansetron (ZOFRAN) injection 4 mg  4 mg Intravenous Once Clapacs, John T, MD      . ondansetron [facility]) injection 4 mg  4 mg Intravenous Once PRN Gunnar Fusi, MD      . ondansetron [facility]) injection 4 mg  4 mg Intravenous Once PRN Gunnar Bulla, MD      . ondansetron [facility]) injection 4 mg  4 mg Intravenous Once PRN Alvin Critchley, MD      . ondansetron (ZOFRAN-ODT) disintegrating tablet 4 mg  4 mg Oral Q8H PRN Pucilowska, Jolanta B, MD   4 mg at 10/08/17 1807  . pantoprazole (PROTONIX) EC tablet 40 mg  40 mg Oral Daily Pucilowska, Jolanta B, MD   40 mg at 10/13/17 0629  . PARoxetine (PAXIL-CR) 24 hr tablet 25 mg  25 mg Oral Daily Pucilowska, Jolanta B, MD   25 mg at 10/13/17 1223  . polyvinyl alcohol (LIQUIFILM TEARS) 1.4 % ophthalmic solution 1 drop  1 drop Both Eyes PRN Ramond Dial, MD   1 drop at 10/13/17 1452  . prazosin (MINIPRESS) capsule 2 mg  2 mg Oral QHS Clapacs, John T, MD      . promethazine (PHENERGAN) tablet 12.5 mg  12.5 mg Oral Q6H PRN Clapacs, Madie Reno, MD   12.5 mg  at 10/08/17 1647  . pyridOXINE (VITAMIN B-6) tablet 100 mg  100 mg Oral BID Clapacs, Madie Reno, MD   100 mg at 10/13/17 1222  . tiZANidine (ZANAFLEX) tablet 2 mg  2 mg Oral Q8H PRN Pucilowska, Jolanta B, MD   2 mg at 10/12/17 1740  . traZODone (DESYREL) tablet 100 mg  100 mg Oral QHS PRN He, Jun, MD   100 mg at 10/11/17 2319  . traZODone (DESYREL) tablet 50 mg  50 mg Oral QHS Pucilowska, Jolanta B, MD   50 mg at 10/12/17 2108  . triamcinolone cream (KENALOG) 0.1 %   Topical TID PRN Ramond Dial, MD        Lab  Results: No results found for this or any previous visit (from the past 48 hour(s)).  Blood Alcohol level:  Lab Results  Component Value Date   ETH <5 09/29/2016   ETH <5 01/18/7251    Metabolic Disorder Labs: Lab Results  Component Value Date   HGBA1C 5.7 (H) 09/20/2017   MPG 116.89 09/20/2017   No results found for: PROLACTIN Lab Results  Component Value Date   CHOL 173 09/20/2017   TRIG 193 (H) 09/20/2017   HDL 50 09/20/2017   CHOLHDL 3.5 09/20/2017   VLDL 39 09/20/2017   LDLCALC 84 09/20/2017   LDLCALC 87 09/04/2017    Physical Findings: AIMS:  , ,  ,  ,    CIWA:    COWS:     Musculoskeletal: Strength & Muscle Tone: within normal limits Gait & Station: normal Patient leans: N/A  Psychiatric Specialty Exam: Physical Exam  Nursing note and vitals reviewed. Eyes: Conjunctivae are normal.  Psychiatric: Her speech is normal. She expresses impulsivity. She exhibits abnormal recent memory.    Review of Systems  Constitutional: Negative.   HENT: Negative.   Eyes: Negative.   Respiratory: Negative.   Cardiovascular: Negative.   Gastrointestinal: Negative.   Musculoskeletal: Negative.   Skin: Negative.   Neurological: Negative.   Psychiatric/Behavioral: Negative.     Blood pressure (!) 142/81, pulse (!) 55, temperature 98.8 F (37.1 C), temperature source Oral, resp. rate 17, height 5\' 3"  (1.6 m), weight 59.9 kg, SpO2 97 %.Body mass index is 23.38 kg/m.  General Appearance: Fairly Groomed  Eye Contact:  Good  Speech:  Clear and Coherent  Volume:  Normal  Mood:  Euthymic  Affect:  Congruent  Thought Process:  Coherent  Orientation:  Full (Time, Place, and Person)  Thought Content:  Logical and Tangential  Suicidal Thoughts:  No  Homicidal Thoughts:  No  Memory:  Immediate;   Fair Recent;   Fair Remote;   Fair  Judgement:  Fair  Insight:  Fair  Psychomotor Activity:  Decreased  Concentration:  Concentration: Fair  Recall:  [name]:   Fair  Language:  Fair  Akathisia:  Negative  Handed:  Right  AIMS (if indicated):     Assets:  Desire for Improvement Resilience  ADL's:  Intact  Cognition:  Impaired,  Mild  Sleep:  Number of Hours: 6.3     Treatment Plan Summary:  Mood improving, pt anxious, somatic, BP better.  Cont current meds- paxil 25 mg , minipress.   Lenward Chancellor, MD 10/13/2017, 2:53 PMPatient ID: Angela [name], female   DOB: 07-25-1950, 67 y.o.   MRN: [id number]

## 2017-10-13 NOTE — Progress Notes (Signed)
D: Pt denies SI/HI/AVH, contracts for safety. Pt. This evening easily angered when demands are not met right away. Pt. Up at the nursing station with multiple demands frequently intrusive knocking on the door demanding to be helped by staff and this Probation officer. Pt. Informed that limit setting needed to be put into place, so that other patient's needs could be addressed as well in a timely manner. Pt. Reports later in the night she would like to do PT for her wrists if possible. Pt. Moments later comes up and is agitated that a PT consult has not been ordered by the provider. Pt. Educated that this request would be reported to treatment team. Pt. Reports, "my eyes are blistering red and blood shot can't you see!?". Pt. Eyes assessed and are WDL with full PERRLA. Pt. Given PRN eye drops for reported dry eyes with reportedly good effect.  A: Q x 15 minute observation checks were completed for safety. Patient was provided with education, but needs reinforcement.  Patient was given/offered medications per orders. Patient  was encourage to attend groups, participate in unit activities and continue with plan of care. Pt. Chart and plans of care reviewed. Pt. Given support and encouragement.   R: Patient is complaint with medication and unit procedures with direction from staff and this Probation officer. Pt. Pain being controled with provider orders with good effect. Pt. Has front wheel walker for safety PRN, but has been observed this evening frequently not using the walker and walking steady. Pt. Refuses to use walker stating, "I'm perfectly alright this evening, thank you".             Precautionary checks every 15 minutes for safety maintained, room free of safety hazards, patient sustains no injury or falls during this shift. Will endorse care to next shift.

## 2017-10-13 NOTE — Plan of Care (Signed)
Verbalize understanding of information received  concerning medications Patient  aware of information received from  staff referring to  education about unit programing .  Able to verbalize understanding. No Attending unit programing , verbalizing  wanting  physical therapy to work with her on her wrists Voice no concerns around  sleep.  Voice no safety concerns .  Emotional and mental health status improving . Unable to make decision about  noted to deflect on other concerns   Placement  remains a  big concern, refusing group home placement .   Problem: Education: Goal: Ability to make informed decisions regarding treatment will improve Outcome: Progressing   Problem: Coping: Goal: Coping ability will improve Outcome: Progressing   Problem: Health Behavior/Discharge Planning: Goal: Identification of resources available to assist in meeting health care needs will improve Outcome: Progressing   Problem: Medication: Goal: Compliance with prescribed medication regimen will improve Outcome: Progressing   Problem: Self-Concept: Goal: Ability to disclose and discuss suicidal ideas will improve Outcome: Progressing Goal: Will verbalize positive feelings about self Outcome: Progressing   Problem: Safety: Goal: Ability to remain free from injury will improve Outcome: Progressing

## 2017-10-14 MED ORDER — PAROXETINE HCL ER 12.5 MG PO TB24
37.5000 mg | ORAL_TABLET | Freq: Every day | ORAL | Status: DC
  Administered 2017-10-15: 37.5 mg via ORAL
  Filled 2017-10-14 (×2): qty 3

## 2017-10-14 MED ORDER — TRAZODONE HCL 100 MG PO TABS
100.0000 mg | ORAL_TABLET | Freq: Every day | ORAL | Status: DC
  Administered 2017-10-14: 100 mg via ORAL
  Filled 2017-10-14: qty 1

## 2017-10-14 NOTE — Progress Notes (Addendum)
[facility] MD Progress Note  10/14/2017 10:17 AM Angela [name]  MRN:  [id number] Subjective:  My mind was overworking"" Pt slept 2 hrs with prn trazodone 100mg  and ativan 0.5 mg .  pt anxious,  somatic, slightly irritable, denies Si/HI, Denies AVH.Took meds, denies side effects.    Principal Problem: Bipolar I disorder, most recent episode mixed, severe with psychotic features ([place]) Diagnosis:   Patient Active Problem List   Diagnosis Date Noted  . Bipolar I disorder, most recent episode mixed, severe with psychotic features ([place]) [F31.64] 09/18/2017  . Agitation [R45.1] 09/18/2017  . IBS (irritable bowel syndrome) [K58.9] 11/17/2016  . Bilateral carpal tunnel syndrome [G56.03] 11/17/2016  . Plantar fasciitis, bilateral [M72.2] 11/17/2016  . Chronic pain syndrome [G89.4] 11/17/2016  . Chronic hyponatremia [E87.1] 11/17/2016  . PTSD (post-traumatic stress disorder) [F43.10] 11/17/2016  . Hernia, hiatal [K44.9] 11/17/2016  . Vitamin D deficiency [E55.9] 11/17/2016  . Vitamin B12 deficiency [E53.8] 11/17/2016  . SIADH (syndrome of inappropriate ADH production) ([place]) [E22.2] 09/08/2016  . Cervical spondylosis with myelopathy [M47.12] 05/24/2016  . Post-concussion headache [G44.309] 05/24/2016  . MCI (mild cognitive impairment) with memory loss [G31.84] 03/30/2016  . Gait abnormality [R26.9] 03/30/2016  . Chronic bipolar affective disorder ([place]) [F31.9] 01/24/2016  . Anemia, iron deficiency [D50.9] 06/24/2015  . Endometriosis [N80.9] 06/24/2015  . Essential hypertension [I10] 06/24/2015  . History of postmenopausal bleeding [Z87.42] 05/22/2015  . History of TIA (transient ischemic attack) [Z86.73] 04/20/2015  . Hypothyroidism [E03.9] 04/20/2015  . GERD (gastroesophageal reflux disease) [K21.9] 04/20/2015  . Cervical disc disorder with radiculopathy [M50.10] 03/25/2015  . Chronic neck pain [M54.2, G89.29] 03/25/2015  . Pelvic pain in female [R10.2] 10/30/2014  . History of skin  cancer [Z85.828] 06/30/2014  . Chronic headaches [R51] 12/16/2013  . Neuropathy [G62.9] 12/16/2013   Total Time spent with patient: 25 min  Past Psychiatric History: Long history of mood disorder multiple hospitalizations  Past Medical History:  Past Medical History:  Diagnosis Date  . Allergic rhinitis   . Anemia   . Blurred vision   . Depression   . Diverticulosis   . Endometriosis   . Falls   . GERD (gastroesophageal reflux disease)   . Hernia, hiatal   . Hypothyroidism   . IBS (irritable bowel syndrome)   . Malignant neoplasm of skin   . Migraine   . Neuropathy   . PTSD (post-traumatic stress disorder)   . Thyroid disease     Past Surgical History:  Procedure Laterality Date  . APPENDECTOMY    . CHOLECYSTECTOMY    . CHOLECYSTECTOMY, LAPAROSCOPIC    . ESOPHAGOGASTRODUODENOSCOPY (EGD) WITH PROPOFOL N/A 03/29/2015   Procedure: ESOPHAGOGASTRODUODENOSCOPY (EGD) WITH PROPOFOL;  Surgeon: Josefine Class, MD;  Location: [facility] ENDOSCOPY;  Service: Endoscopy;  Laterality: N/A;  . HERNIA REPAIR    . laproscopy    . TONSILLECTOMY    . uteral suspension     Family History:  Family History  Problem Relation Age of Onset  . Heart disease Father   . Cancer Mother        lung  . Urolithiasis Neg Hx   . Kidney disease Neg Hx   . Kidney cancer Neg Hx   . Prostate cancer Neg Hx    Family Psychiatric  History: See previous note Social History:  Social History   Substance and Sexual Activity  Alcohol Use No     Social History   Substance and Sexual Activity  Drug Use No    Social  History   Socioeconomic History  . Marital status: Single    Spouse name: Not on file  . Number of children: 0  . Years of education: 1.5 years of college  . Highest education level: Not on file  Occupational History  . Occupation: Retired  Scientific laboratory technician  . Financial resource strain: Not on file  . Food insecurity:    Worry: Not on file    Inability: Not on file  . Transportation  needs:    Medical: Not on file    Non-medical: Not on file  Tobacco Use  . Smoking status: Former Smoker    Last attempt to quit: 11/17/1976    Years since quitting: 40.9  . Smokeless tobacco: Never Used  Substance and Sexual Activity  . Alcohol use: No  . Drug use: No  . Sexual activity: Not Currently  Lifestyle  . Physical activity:    Days per week: Not on file    Minutes per session: Not on file  . Stress: Not on file  Relationships  . Social connections:    Talks on phone: Not on file    Gets together: Not on file    Attends religious service: Not on file    Active member of club or organization: Not on file    Attends meetings of clubs or organizations: Not on file    Relationship status: Not on file  Other Topics Concern  . Not on file  Social History Narrative   Lives at home alone.   Right-handed.   No daily caffeine use.   Additional Social History:    Pain Medications: See PTA Prescriptions: See PTA Over the Counter: See PTA History of alcohol / drug use?: No history of alcohol / drug abuse Longest period of sobriety (when/how long): Reports of none Negative Consequences of Use: (n/a) Withdrawal Symptoms: (n/a)                    Sleep: Fair  Appetite:  Good  Current Medications: Current Facility-Administered Medications  Medication Dose Route Frequency Provider Last Rate Last Dose  . acetaminophen (TYLENOL) tablet 650 mg  650 mg Oral Q6H PRN Clapacs, Madie Reno, MD   650 mg at 10/13/17 1444  . albuterol (PROVENTIL) (2.5 MG/3ML) 0.083% nebulizer solution 2.5 mg  2.5 mg Inhalation Q6H PRN Clapacs, John T, MD      . alum & mag hydroxide-simeth (MAALOX/MYLANTA) 200-200-20 MG/5ML suspension 30 mL  30 mL Oral Q4H PRN Clapacs, Madie Reno, MD   30 mL at 10/08/17 1507  . amLODipine (NORVASC) tablet 2.5 mg  2.5 mg Oral BID Clapacs, John T, MD   2.5 mg at 10/14/17 0900  . calcium carbonate (TUMS - dosed in mg elemental calcium) chewable tablet 500 mg of elemental  calcium  500 mg of elemental calcium Oral BID WC Pucilowska, Jolanta B, MD   500 mg of elemental calcium at 10/14/17 0900  . cholecalciferol (VITAMIN D) tablet 1,000 Units  1,000 Units Oral Daily Pucilowska, Jolanta B, MD   1,000 Units at 10/14/17 0900  . cycloSPORINE (RESTASIS) 0.05 % ophthalmic emulsion 1 drop  1 drop Both Eyes BID Clapacs, John T, MD   1 drop at 10/14/17 0900  . diclofenac sodium (VOLTAREN) 1 % transdermal gel 4 g  4 g Topical QID Pucilowska, Jolanta B, MD   4 g at 10/14/17 0900  . docusate sodium (COLACE) capsule 100 mg  100 mg Oral BID Clapacs, Madie Reno, MD  100 mg at 10/14/17 0900  . feeding supplement (BOOST / RESOURCE BREEZE) liquid 1 Container  1 Container Oral TID BM Clapacs, Madie Reno, MD   1 Container at 10/13/17 2137  . fentaNYL (SUBLIMAZE) injection 25 mcg  25 mcg Intravenous Q5 min PRN Gunnar Bulla, MD      . fentaNYL (SUBLIMAZE) injection 25 mcg  25 mcg Intravenous Q5 min PRN Alvin Critchley, MD      . ferrous sulfate tablet 325 mg  325 mg Oral Q breakfast Pucilowska, Jolanta B, MD   325 mg at 10/14/17 0900  . gabapentin (NEURONTIN) capsule 100 mg  100 mg Oral BID Clapacs, John T, MD   100 mg at 10/14/17 0900  . glycopyrrolate (ROBINUL) injection 0.1 mg  0.1 mg Intravenous Once Clapacs, John T, MD      . hydrocortisone cream 1 %   Topical BID Clapacs, John T, MD      . levothyroxine (SYNTHROID, LEVOTHROID) tablet 100 mcg  100 mcg Oral QAC breakfast Pucilowska, Jolanta B, MD   100 mcg at 10/14/17 0609  . lidocaine (LIDODERM) 5 % 1 patch  1 patch Transdermal Q24H Pucilowska, Jolanta B, MD   1 patch at 10/10/17 1359  . linaclotide (LINZESS) capsule 290 mcg  290 mcg Oral QAC breakfast Pucilowska, Jolanta B, MD   290 mcg at 10/14/17 0609  . lip balm (BLISTEX) ointment   Topical PRN Clapacs, John T, MD      . LORazepam (ATIVAN) tablet 0.5 mg  0.5 mg Oral Q6H PRN Clapacs, Madie Reno, MD   0.5 mg at 10/14/17 0017  . magnesium hydroxide (MILK OF MAGNESIA) suspension 30 mL  30 mL Oral  Daily PRN Clapacs, Madie Reno, MD   30 mL at 10/05/17 1731  . magnesium oxide (MAG-OX) tablet 200 mg  200 mg Oral BID Pucilowska, Jolanta B, MD   200 mg at 10/14/17 0900  . midazolam (VERSED) injection 2 mg  2 mg Intravenous Once Clapacs, John T, MD      . multivitamin with minerals tablet 1 tablet  1 tablet Oral Daily Pucilowska, Jolanta B, MD   1 tablet at 10/14/17 0900  . omega-3 acid ethyl esters (LOVAZA) capsule 1 g  1 g Oral BID Pucilowska, Jolanta B, MD   1 g at 10/14/17 0900  . ondansetron (ZOFRAN) injection 4 mg  4 mg Intravenous Once Clapacs, John T, MD      . ondansetron [facility]) injection 4 mg  4 mg Intravenous Once PRN Gunnar Fusi, MD      . ondansetron [facility]) injection 4 mg  4 mg Intravenous Once PRN Gunnar Bulla, MD      . ondansetron [facility]) injection 4 mg  4 mg Intravenous Once PRN Alvin Critchley, MD      . ondansetron (ZOFRAN-ODT) disintegrating tablet 4 mg  4 mg Oral Q8H PRN Pucilowska, Jolanta B, MD   4 mg at 10/08/17 1807  . pantoprazole (PROTONIX) EC tablet 40 mg  40 mg Oral Daily Pucilowska, Jolanta B, MD   40 mg at 10/14/17 0609  . PARoxetine (PAXIL-CR) 24 hr tablet 25 mg  25 mg Oral Daily Pucilowska, Jolanta B, MD   25 mg at 10/14/17 0900  . polyvinyl alcohol (LIQUIFILM TEARS) 1.4 % ophthalmic solution 1 drop  1 drop Both Eyes PRN Ramond Dial, MD   1 drop at 10/14/17 0012  . prazosin (MINIPRESS) capsule 2 mg  2 mg Oral QHS Clapacs, Madie Reno, MD   2 mg  at 10/13/17 2138  . promethazine (PHENERGAN) tablet 12.5 mg  12.5 mg Oral Q6H PRN Clapacs, John T, MD   12.5 mg at 10/08/17 1647  . pyridOXINE (VITAMIN B-6) tablet 100 mg  100 mg Oral BID Clapacs, John T, MD   100 mg at 10/14/17 0900  . tiZANidine (ZANAFLEX) tablet 2 mg  2 mg Oral Q8H PRN Pucilowska, Jolanta B, MD   2 mg at 10/12/17 1740  . traZODone (DESYREL) tablet 100 mg  100 mg Oral QHS PRN He, Jun, MD   100 mg at 10/14/17 0018  . traZODone (DESYREL) tablet 50 mg  50 mg Oral QHS Pucilowska, Jolanta B, MD   50  mg at 10/13/17 2139  . triamcinolone cream (KENALOG) 0.1 %   Topical TID PRN Ramond Dial, MD        Lab Results: No results found for this or any previous visit (from the past 48 hour(s)).  Blood Alcohol level:  Lab Results  Component Value Date   ETH <5 09/29/2016   ETH <5 57/32/2025    Metabolic Disorder Labs: Lab Results  Component Value Date   HGBA1C 5.7 (H) 09/20/2017   MPG 116.89 09/20/2017   No results found for: PROLACTIN Lab Results  Component Value Date   CHOL 173 09/20/2017   TRIG 193 (H) 09/20/2017   HDL 50 09/20/2017   CHOLHDL 3.5 09/20/2017   VLDL 39 09/20/2017   LDLCALC 84 09/20/2017   LDLCALC 87 09/04/2017    Physical Findings: AIMS:  , ,  ,  ,    CIWA:    COWS:     Musculoskeletal: Strength & Muscle Tone: within normal limits Gait & Station: normal Patient leans: N/A  Psychiatric Specialty Exam: Physical Exam  Nursing note and vitals reviewed. Eyes: Conjunctivae are normal.  Psychiatric: Her speech is normal. She expresses impulsivity. She exhibits abnormal recent memory.    Review of Systems  Constitutional: Negative.   HENT: Negative.   Eyes: Negative.   Respiratory: Negative.   Cardiovascular: Negative.   Gastrointestinal: Negative.   Musculoskeletal: Negative.   Skin: Negative.   Neurological: Negative.   Psychiatric/Behavioral: Negative.     Blood pressure 110/68, pulse (!) 56, temperature 98 F (36.7 C), temperature source Oral, resp. rate 18, height 5\' 3"  (1.6 m), weight 59.9 kg, SpO2 97 %.Body mass index is 23.38 kg/m.  General Appearance: Fairly Groomed  Eye Contact:  Good  Speech:  Clear and Coherent  Volume:  Normal  Mood:  Euthymic  Affect:  Congruent  Thought Process:  Coherent  Orientation:  Full (Time, Place, and Person)  Thought Content:  Somatic, denies AVH  Suicidal Thoughts:  denies  Homicidal Thoughts:  No  Memory:  Immediate;   Fair Recent;   Fair Remote;   Fair  Judgement:  Fair  Insight:  Fair   Psychomotor Activity:  Decreased  Concentration:  Concentration: Fair  Recall:  [name]:  Fair  Language:  Fair  Akathisia:  Negative  Handed:  Right  AIMS (if indicated):     Assets:  Desire for Improvement Resilience  ADL's:  Intact  Cognition:  Impaired,  Mild  Sleep:  Number of Hours: 2     Treatment Plan Summary:  pt anxious, somatic, slept 2 hrs.   Increase trazodone for sleep.  Cont current meds- increase paxil for anxiety  , will rpt cmp in morning. cont  minipress.  Group/Milieu tx.   Lenward Chancellor, MD 10/14/2017, 10:17 AMPatient ID:  Angela [name], female   DOB: 10/23/1950, 67 y.o.   MRN: [id number] Patient ID: Angela [name], female   DOB: 10/28/1950, 67 y.o.   MRN: [id number]

## 2017-10-14 NOTE — Progress Notes (Signed)
   10/14/17 1815  Clinical Encounter Type  Visited With Patient  Visit Type Follow-up;Spiritual support  Spiritual Encounters  Spiritual Needs Emotional;Prayer   Chaplain followed up from earlier interrupted visit.  Patient expressed frustrations with events of today and requested prayer from chaplain.  After chaplain prayed, patient began praying aloud regarding loss of relationships, personal possessions, and self-patient entreating God to hear and respond.  Chaplain maintained prayerful space and utilized active listening through this process.  Chaplain and patient then engaged in dialogue over insights patient felt she gained during her prayer and her perceptions of God's response.  Patient asked chaplain to pray again regarding her housing situation and her support system which chaplain did.  Patient then began praying aloud again asking for forgiveness and asking for the people and situations she needs to be available for and open to her.  Chaplain held prayerful space as patient engaged in her entreaty.

## 2017-10-14 NOTE — Plan of Care (Signed)
D: Pt denies SI/HI/AV hallucinations. Pt is pleasant and cooperative. Pt. Was in bed doing the morning states she did not sleep well last night.  A: Pt was offered support and encouragement. Pt was given scheduled medications. Pt was encourage to attend groups. Q 15 minute checks were done for safety.  R:Pt attends groups and interacts well with peers and staff. Pt is taking medication. Pt has no complaints.Pt receptive to treatment and safety maintained on unit.    Problem: Medication: Goal: Compliance with prescribed medication regimen will improve Outcome: Progressing   Problem: Safety: Goal: Ability to remain free from injury will improve Outcome: Progressing

## 2017-10-14 NOTE — Progress Notes (Signed)
   10/14/17 1535  Clinical Encounter Type  Visit Type Follow-up  Referral From Nurse  Consult/Referral To Chaplain  Spiritual Encounters  Spiritual Needs Emotional;Grief support   Chaplain called staff to respond to page then finished prior visit.  Chaplain met with patient on unit.  Patient expressed her need to confess her sins.  Chaplain utilized active and reflective listening as patient processed her history of losses and impact they have had on her current situation.  Chaplain received page to code, excused herself and apologized to patient with intent to follow up later today.

## 2017-10-14 NOTE — [facility] Group Notes (Signed)
LCSW Group Therapy Note 10/14/2017 1:15pm  Type of Therapy and Topic: Group Therapy: Feelings Around Returning Home & Establishing a Supportive Framework and Supporting Oneself When Supports Not Available  Participation Level: Did Not Attend  Description of Group:  Patients first processed thoughts and feelings about upcoming discharge. These included fears of upcoming changes, lack of change, new living environments, judgements and expectations from others and overall stigma of mental health issues. The group then discussed the definition of a supportive framework, what that looks and feels like, and how do to discern it from an unhealthy non-supportive network. The group identified different types of supports as well as what to do when your family/friends are less than helpful or unavailable  Therapeutic Goals  1. Patient will identify one healthy supportive network that they can use at discharge. 2. Patient will identify one factor of a supportive framework and how to tell it from an unhealthy network. 3. Patient able to identify one coping skill to use when they do not have positive supports from others. 4. Patient will demonstrate ability to communicate their needs through discussion and/or role plays.  Summary of Patient Progress:  Pt was invited to attend group but chose not to attend. CSW will continue to encourage pt to attend group throughout their admission.   Therapeutic Modalities Cognitive Behavioral Therapy Motivational Interviewing   Angela [name]  [name], LCSW 10/14/2017 10:27 AM
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
## 2017-10-14 NOTE — Plan of Care (Signed)
  Problem: Medication: Goal: Compliance with prescribed medication regimen will improve Outcome: Progressing   Problem: Safety: Goal: Ability to remain free from injury will improve Outcome: Progressing  Medication administered per MD order. Patient remains free from injury and verbally contracts for safety.

## 2017-10-15 LAB — COMPREHENSIVE METABOLIC PANEL
ALT: 19 U/L (ref 0–44)
ANION GAP: 10 (ref 5–15)
AST: 24 U/L (ref 15–41)
Albumin: 3.4 g/dL — ABNORMAL LOW (ref 3.5–5.0)
Alkaline Phosphatase: 59 U/L (ref 38–126)
BUN: 16 mg/dL (ref 8–23)
CO2: 23 mmol/L (ref 22–32)
Calcium: 8.9 mg/dL (ref 8.9–10.3)
Chloride: 107 mmol/L (ref 98–111)
Creatinine, Ser: 0.74 mg/dL (ref 0.44–1.00)
GFR calc Af Amer: >60 mL/min (ref >60)
GFR calc non Af Amer: >60 mL/min (ref >60)
Glucose, Bld: 82 mg/dL (ref 70–99)
POTASSIUM: 4 mmol/L (ref 3.5–5.1)
SODIUM: 140 mmol/L (ref 135–145)
Total Bilirubin: 0.7 mg/dL (ref 0.3–1.2)
Total Protein: 6.2 g/dL — ABNORMAL LOW (ref 6.5–8.1)

## 2017-10-15 MED ORDER — LOPERAMIDE HCL 2 MG PO CAPS
2.0000 mg | ORAL_CAPSULE | ORAL | Status: DC | PRN
  Administered 2017-10-15 – 2017-11-05 (×3): 2 mg via ORAL
  Filled 2017-10-15 (×3): qty 1

## 2017-10-15 MED ORDER — TRAZODONE HCL 50 MG PO TABS
150.0000 mg | ORAL_TABLET | Freq: Every day | ORAL | Status: DC
  Administered 2017-10-15 – 2017-10-22 (×7): 150 mg via ORAL
  Filled 2017-10-15 (×8): qty 1

## 2017-10-15 NOTE — Progress Notes (Signed)
Recreation Therapy Notes   Date: 10/15/2017  Time: 9:30 am   Location: Craft room   Behavioral response: N/A   Intervention Topic: Team work  Discussion/Intervention: Patient did not attend group.   Clinical Observations/Feedback:  Patient did not attend group.   Jalexis Breed LRT/CTRS        Tytus Strahle 10/15/2017 10:14 AM

## 2017-10-15 NOTE — Plan of Care (Signed)
Pt. Complaint with medications with encouragement and direction from staff. Pt. Denies si/hi, verbally is able to contract for safety.   Problem: Education: Goal: Ability to make informed decisions regarding treatment will improve Outcome: Progressing   Problem: Medication: Goal: Compliance with prescribed medication regimen will improve Outcome: Progressing   Problem: Self-Concept: Goal: Ability to disclose and discuss suicidal ideas will improve Outcome: Progressing   Problem: Safety: Goal: Ability to remain free from injury will improve Outcome: Progressing   Problem: Coping: Goal: Coping ability will improve Outcome: Not Progressing   Problem: Health Behavior/Discharge Planning: Goal: Identification of resources available to assist in meeting health care needs will improve Outcome: Not Progressing   Problem: Self-Concept: Goal: Will verbalize positive feelings about self Outcome: Not Progressing

## 2017-10-15 NOTE — Plan of Care (Signed)
Verbalize understanding of information received  concerning medications Patient  aware of information received from  staff referring to  education about unit programing .  Able to verbalize understanding. No Attending unit programing , verbalizing  wanting  physical therapy to work with her on her wrists Voice no concerns around  sleep.  Voice no safety concerns .  Emotional and mental health status improving . Unable to make decision about  noted to deflect on other concerns   Placement  remains a  big concern, refusing group home placement  Problem: Education: Goal: Ability to make informed decisions regarding treatment will improve Outcome: Progressing   Problem: Coping: Goal: Coping ability will improve Outcome: Progressing   Problem: Health Behavior/Discharge Planning: Goal: Identification of resources available to assist in meeting health care needs will improve Outcome: Progressing   Problem: Medication: Goal: Compliance with prescribed medication regimen will improve Outcome: Progressing   Problem: Self-Concept: Goal: Ability to disclose and discuss suicidal ideas will improve Outcome: Progressing Goal: Will verbalize positive feelings about self Outcome: Progressing   Problem: Safety: Goal: Ability to remain free from injury will improve Outcome: Progressing  .

## 2017-10-15 NOTE — Progress Notes (Signed)
Patient compliant of going  Diarrhea this shift . A: Informed  MD Puc[place]ska
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
## 2017-10-15 NOTE — [facility] Group Notes (Signed)
[facility] Notes:  (Nursing/MHT/Case Management/Adjunct)  Date:  10/15/2017  Time:  9:30 AM  Type of Therapy:  Goal Setting  Participation Level:  Did Not Attend   Summary of Progress/Problems:  Angela [name] R [name] 10/15/2017, 9:30 AM
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
## 2017-10-15 NOTE — Progress Notes (Signed)
D: Patient stated slept poor last night .Stated appetite poor  and energy level  Low . Stated concentration poor . Stated on Depression scale ,9 hopeless and anxiety 8 .( low 0-10 high) Denies suicidal  homicidal ideations  .  No auditory hallucinations  No pain concerns . Appropriate ADL'S. Interacting with peers and staff. Verbalize understanding of information received concerning medications Patient aware of information received from staff referring to education about unit programing .  Patient did not get up for medications this am shift  Morning med's given after lunch Able to verbalize understanding. NoAttending unit programing,verbalizing  wanting  physical therapy to work with her on her wrists Voice no concerns around sleep. Voice no safety concerns . Emotional and mental health status improving.Unable to make decision about  noted to deflect on other concerns  Placement  remains a  big concern, refusing group home placement . Continue to voice  [facility] Owes her placement  Stated she needed a Education officer, museum  To work on  Placement concerns   A: Encourage patient participation with unit programming . Instruction  Given on  Medication , verbalize understanding. R: Voice no other concerns. Staff continue to monitor

## 2017-10-16 MED ORDER — LORAZEPAM 2 MG PO TABS
2.0000 mg | ORAL_TABLET | Freq: Once | ORAL | Status: AC
  Administered 2017-10-16: 2 mg via ORAL
  Filled 2017-10-16: qty 1

## 2017-10-16 MED ORDER — PAROXETINE HCL ER 12.5 MG PO TB24
25.0000 mg | ORAL_TABLET | Freq: Every day | ORAL | Status: DC
  Administered 2017-10-17 – 2017-11-06 (×20): 25 mg via ORAL
  Filled 2017-10-16 (×21): qty 2

## 2017-10-16 NOTE — Progress Notes (Signed)
D: Patient stated slept good last night .Stated appetite is good and energy level  Is normal. Stated concentration is good . Stated on Depression scale , hopeless and anxiety .( low 0-10 high) Patient continue to voice of being sick stated she ws having loose stool but culdn't show Probation officer , stated to Probation officer  " well I know it's coming "Denies suicidal  homicidal ideations  .  No auditory hallucinations  No pain concerns . Appropriate ADL'S. Interacting with peers and staff. No  unit  attendance to programing . Unwilling to listen  of ways to cope . Compliant with medication  , denies suicidal  ideations Voice of no safety concerns  A: Encourage patient participation with unit programming . Instruction  Given on  Medication , verbalize understanding. R: Voice no other concerns. Staff continue to monitor

## 2017-10-16 NOTE — Tx Team (Signed)
Interdisciplinary Treatment and Diagnostic Plan Update  10/15/2017 Time of Session: 10:30am Angela [name] MRN: [id number]  Principal Diagnosis: Bipolar I disorder, most recent episode mixed, severe with psychotic features ([place])  Secondary Diagnoses: Principal Problem:   Bipolar I disorder, most recent episode mixed, severe with psychotic features ([place]) Active Problems:   Hypothyroidism   Essential hypertension   Plantar fasciitis, bilateral   PTSD (post-traumatic stress disorder)   Agitation   Current Medications:  Current Facility-Administered Medications  Medication Dose Route Frequency Provider Last Rate Last Dose  . acetaminophen (TYLENOL) tablet 650 mg  650 mg Oral Q6H PRN Clapacs, Madie Reno, MD   650 mg at 10/16/17 1540  . albuterol (PROVENTIL) (2.5 MG/3ML) 0.083% nebulizer solution 2.5 mg  2.5 mg Inhalation Q6H PRN Clapacs, John T, MD      . alum & mag hydroxide-simeth (MAALOX/MYLANTA) 200-200-20 MG/5ML suspension 30 mL  30 mL Oral Q4H PRN Clapacs, Madie Reno, MD   30 mL at 10/08/17 1507  . amLODipine (NORVASC) tablet 2.5 mg  2.5 mg Oral BID Clapacs, Madie Reno, MD   2.5 mg at 10/16/17 1216  . calcium carbonate (TUMS - dosed in mg elemental calcium) chewable tablet 500 mg of elemental calcium  500 mg of elemental calcium Oral BID WC Pucilowska, Jolanta B, MD   500 mg of elemental calcium at 10/16/17 1203  . cholecalciferol (VITAMIN D) tablet 1,000 Units  1,000 Units Oral Daily Pucilowska, Jolanta B, MD   1,000 Units at 10/16/17 1217  . cycloSPORINE (RESTASIS) 0.05 % ophthalmic emulsion 1 drop  1 drop Both Eyes BID Clapacs, Madie Reno, MD   1 drop at 10/16/17 1221  . diclofenac sodium (VOLTAREN) 1 % transdermal gel 4 g  4 g Topical QID Pucilowska, Jolanta B, MD   4 g at 10/16/17 1231  . docusate sodium (COLACE) capsule 100 mg  100 mg Oral BID Clapacs, Madie Reno, MD   100 mg at 10/12/17 1733  . feeding supplement (BOOST / RESOURCE BREEZE) liquid 1 Container  1 Container Oral TID BM Clapacs,  Madie Reno, MD   1 Container at 10/16/17 1400  . fentaNYL (SUBLIMAZE) injection 25 mcg  25 mcg Intravenous Q5 min PRN Gunnar Bulla, MD      . fentaNYL (SUBLIMAZE) injection 25 mcg  25 mcg Intravenous Q5 min PRN Alvin Critchley, MD      . ferrous sulfate tablet 325 mg  325 mg Oral Q breakfast Pucilowska, Jolanta B, MD   325 mg at 10/16/17 1220  . gabapentin (NEURONTIN) capsule 100 mg  100 mg Oral BID Clapacs, Madie Reno, MD   100 mg at 10/16/17 1220  . glycopyrrolate (ROBINUL) injection 0.1 mg  0.1 mg Intravenous Once Clapacs, John T, MD      . hydrocortisone cream 1 %   Topical BID Clapacs, Madie Reno, MD   1 application at 79/39/03 1222  . levothyroxine (SYNTHROID, LEVOTHROID) tablet 100 mcg  100 mcg Oral QAC breakfast Pucilowska, Jolanta B, MD   100 mcg at 10/15/17 0092  . lidocaine (LIDODERM) 5 % 1 patch  1 patch Transdermal Q24H Pucilowska, Jolanta B, MD   1 patch at 10/16/17 1228  . linaclotide (LINZESS) capsule 290 mcg  290 mcg Oral QAC breakfast Pucilowska, Jolanta B, MD   290 mcg at 10/15/17 0637  . lip balm (BLISTEX) ointment   Topical PRN Clapacs, John T, MD      . loperamide (IMODIUM) capsule 2 mg  2 mg Oral PRN Pucilowska, Jolanta  B, MD   2 mg at 10/15/17 1835  . LORazepam (ATIVAN) tablet 0.5 mg  0.5 mg Oral Q6H PRN Clapacs, Madie Reno, MD   0.5 mg at 10/15/17 2104  . magnesium hydroxide (MILK OF MAGNESIA) suspension 30 mL  30 mL Oral Daily PRN Clapacs, Madie Reno, MD   30 mL at 10/05/17 1731  . magnesium oxide (MAG-OX) tablet 200 mg  200 mg Oral BID Pucilowska, Jolanta B, MD   200 mg at 10/16/17 1224  . midazolam (VERSED) injection 2 mg  2 mg Intravenous Once Clapacs, John T, MD      . multivitamin with minerals tablet 1 tablet  1 tablet Oral Daily Pucilowska, Jolanta B, MD   1 tablet at 10/16/17 1225  . omega-3 acid ethyl esters (LOVAZA) capsule 1 g  1 g Oral BID Pucilowska, Jolanta B, MD   1 g at 10/16/17 1220  . ondansetron (ZOFRAN) injection 4 mg  4 mg Intravenous Once Clapacs, John T, MD      .  ondansetron [facility]) injection 4 mg  4 mg Intravenous Once PRN Gunnar Fusi, MD      . ondansetron [facility]) injection 4 mg  4 mg Intravenous Once PRN Gunnar Bulla, MD      . ondansetron [facility]) injection 4 mg  4 mg Intravenous Once PRN Alvin Critchley, MD      . ondansetron (ZOFRAN-ODT) disintegrating tablet 4 mg  4 mg Oral Q8H PRN Pucilowska, Jolanta B, MD   4 mg at 10/08/17 1807  . pantoprazole (PROTONIX) EC tablet 40 mg  40 mg Oral Daily Pucilowska, Jolanta B, MD   40 mg at 10/15/17 2831  . PARoxetine (PAXIL-CR) 24 hr tablet 37.5 mg  37.5 mg Oral Daily Lenward Chancellor, MD   37.5 mg at 10/15/17 1412  . polyvinyl alcohol (LIQUIFILM TEARS) 1.4 % ophthalmic solution 1 drop  1 drop Both Eyes PRN Ramond Dial, MD   1 drop at 10/15/17 2048  . prazosin (MINIPRESS) capsule 2 mg  2 mg Oral QHS Clapacs, Madie Reno, MD   2 mg at 10/15/17 2102  . promethazine (PHENERGAN) tablet 12.5 mg  12.5 mg Oral Q6H PRN Clapacs, John T, MD   12.5 mg at 10/08/17 1647  . pyridOXINE (VITAMIN B-6) tablet 100 mg  100 mg Oral BID Clapacs, Madie Reno, MD   100 mg at 10/16/17 1219  . tiZANidine (ZANAFLEX) tablet 2 mg  2 mg Oral Q8H PRN Pucilowska, Jolanta B, MD   2 mg at 10/12/17 1740  . traZODone (DESYREL) tablet 150 mg  150 mg Oral QHS Chauncey Mann, MD   150 mg at 10/15/17 2103  . triamcinolone cream (KENALOG) 0.1 %   Topical TID PRN Ramond Dial, MD   1 application at 51/76/16 2106   PTA Medications: Medications Prior to Admission  Medication Sig Dispense Refill Last Dose  . amLODipine (NORVASC) 5 MG tablet Take 1 tablet (5 mg total) by mouth 2 (two) times daily. 180 tablet 1 10/09/2017 at Unknown time  . ANORO ELLIPTA 62.5-25 MCG/INH AEPB Inhale 1 puff into the lungs daily. 14 each 0 10/09/2017 at Unknown time  . calcium carbonate (OS-CAL) 600 MG TABS tablet Take 1 tablet (600 mg total) by mouth 2 (two) times daily with a meal. (Patient taking differently: Take 600 mg 3 (three) times daily by mouth. ) 60 tablet  0 10/09/2017 at Unknown time  . cholecalciferol (VITAMIN D) 1000 units tablet Take 4,000 Units by mouth daily.  10/09/2017 at Unknown time  . citalopram (CELEXA) 20 MG tablet Take 20 mg by mouth daily.  0 10/09/2017 at Unknown time  . conjugated estrogens (PREMARIN) vaginal cream Place vaginally 3 (three) times a week. Use 1/2 gram 3-4 times weekly. 42.5 g 4 10/09/2017 at Unknown time  . cycloSPORINE (RESTASIS) 0.05 % ophthalmic emulsion Place 1 drop into both eyes 2 (two) times daily. 60 each 5 10/09/2017 at Unknown time  . diclofenac sodium (VOLTAREN) 1 % GEL Apply 2 g topically 4 (four) times daily.   10/09/2017 at Unknown time  . ferrous sulfate (FERROUSUL) 325 (65 FE) MG tablet Take 1 tablet (325 mg total) by mouth daily with breakfast. 90 tablet 1 10/09/2017 at Unknown time  . gabapentin (NEURONTIN) 100 MG capsule Take 1-2 capsules (100-200 mg total) by mouth 3 (three) times daily. (Patient taking differently: Take 100 mg by mouth 2 (two) times daily. ) 540 capsule 1 10/09/2017 at Unknown time  . guaiFENesin (MUCINEX) 600 MG 12 hr tablet Take 1 tablet (600 mg total) by mouth 2 (two) times daily as needed for cough. (Patient taking differently: Take 600 mg by mouth daily. ) 30 tablet 0 10/09/2017 at Unknown time  . hydrocortisone (PROCTOZONE-HC) 2.5 % rectal cream Place 1 application rectally 2 (two) times daily. 30 g 1 10/09/2017 at Unknown time  . hydrOXYzine (ATARAX/VISTARIL) 10 MG tablet Take 1 tablet by mouth 2 (two) times daily as needed for anxiety.    10/09/2017 at Unknown time  . KRILL OIL OMEGA-3 PO Take 350 mg by mouth daily.   10/09/2017 at Unknown time  . levalbuterol (XOPENEX) 1.25 MG/3ML nebulizer solution Inhale 3 mLs into the lungs daily.   10/09/2017 at Unknown time  . levothyroxine (SYNTHROID, LEVOTHROID) 100 MCG tablet Take 1 tablet (100 mcg total) by mouth daily before breakfast. 90 tablet 1 10/09/2017 at Unknown time  . linaclotide (LINZESS) 290 MCG CAPS capsule Take 1 capsule (290 mcg  total) by mouth daily before breakfast. 90 capsule 1 10/09/2017 at Unknown time  . magnesium oxide (MAG-OX) 400 MG tablet Take 400 mg by mouth daily with breakfast.   10/09/2017 at Unknown time  . montelukast (SINGULAIR) 10 MG tablet Take 1 tablet by mouth at bedtime.  11 10/09/2017 at Unknown time  . omeprazole (PRILOSEC) 20 MG capsule Take 1 capsule by mouth daily.   10/09/2017 at Unknown time  . Polyethyl Glycol-Propyl Glycol (SYSTANE OP) Place 1 drop into both eyes daily as needed (dry eyes).   10/09/2017 at Unknown time  . polyethylene glycol powder (GLYCOLAX/MIRALAX) powder Take 17-34 g by mouth daily. 1700 g 5 10/09/2017 at Unknown time  . Probiotic Product (PROBIOTIC PO) Take 1 tablet by mouth daily.   10/09/2017 at Unknown time  . promethazine (PHENERGAN) 25 MG tablet Take 0.5 tablets (12.5 mg total) by mouth every 6 (six) hours as needed for nausea or vomiting. 30 tablet 0 10/09/2017 at Unknown time  . Pyridoxine HCl (VITAMIN B-6) 500 MG tablet Take 1 tablet by mouth 2 (two) times daily.   10/09/2017 at Unknown time  . ranitidine (ZANTAC) 150 MG tablet Take 150 mg by mouth daily before supper.   10/09/2017 at Unknown time  . tiZANidine (ZANAFLEX) 2 MG tablet Take 0.5-1 tablets (1-2 mg total) by mouth 2 (two) times daily as needed for muscle spasms. 180 tablet 1 10/09/2017 at Unknown time  . tolterodine (DETROL LA) 2 MG 24 hr capsule Take 1 capsule (2 mg total) daily by mouth. 30 capsule  11 10/09/2017 at Unknown time  . traZODone (DESYREL) 50 MG tablet Take 50 mg by mouth at bedtime.   10/09/2017 at Unknown time    Patient Stressors: Financial difficulties Health problems Traumatic event Other: "Pain" "fear" "trauma" "homeless"  Patient Strengths: Ability for insight Communication skills General fund of knowledge Motivation for treatment/growth  Treatment Modalities: Medication Management, Group therapy, Case management,  1 to 1 session with clinician, Psychoeducation, Recreational  therapy.   Physician Treatment Plan for Primary Diagnosis: Bipolar I disorder, most recent episode mixed, severe with psychotic features ([place]) Long Term Goal(s): Improvement in symptoms so as ready for discharge Improvement in symptoms so as ready for discharge   Short Term Goals: Ability to identify changes in lifestyle to reduce recurrence of condition will improve Ability to verbalize feelings will improve Ability to disclose and discuss suicidal ideas Ability to demonstrate self-control will improve Ability to identify and develop effective coping behaviors will improve Ability to maintain clinical measurements within normal limits will improve Compliance with prescribed medications will improve Ability to identify triggers associated with substance abuse/mental health issues will improve NA  Medication Management: Evaluate patient's response, side effects, and tolerance of medication regimen.  Therapeutic Interventions: 1 to 1 sessions, Unit Group sessions and Medication administration.  Evaluation of Outcomes: Progressing  Physician Treatment Plan for Secondary Diagnosis: Principal Problem:   Bipolar I disorder, most recent episode mixed, severe with psychotic features ([place]) Active Problems:   Hypothyroidism   Essential hypertension   Plantar fasciitis, bilateral   PTSD (post-traumatic stress disorder)   Agitation  Long Term Goal(s): Improvement in symptoms so as ready for discharge Improvement in symptoms so as ready for discharge   Short Term Goals: Ability to identify changes in lifestyle to reduce recurrence of condition will improve Ability to verbalize feelings will improve Ability to disclose and discuss suicidal ideas Ability to demonstrate self-control will improve Ability to identify and develop effective coping behaviors will improve Ability to maintain clinical measurements within normal limits will improve Compliance with prescribed medications will  improve Ability to identify triggers associated with substance abuse/mental health issues will improve NA     Medication Management: Evaluate patient's response, side effects, and tolerance of medication regimen.  Therapeutic Interventions: 1 to 1 sessions, Unit Group sessions and Medication administration.  Evaluation of Outcomes: Progressing   RN Treatment Plan for Primary Diagnosis: Bipolar I disorder, most recent episode mixed, severe with psychotic features ([place]) Long Term Goal(s): Knowledge of disease and therapeutic regimen to maintain health will improve  Short Term Goals: Ability to identify and develop effective coping behaviors will improve and Compliance with prescribed medications will improve  Medication Management: RN will administer medications as ordered by provider, will assess and evaluate patient's response and provide education to patient for prescribed medication. RN will report any adverse and/or side effects to prescribing provider.  Therapeutic Interventions: 1 on 1 counseling sessions, Psychoeducation, Medication administration, Evaluate responses to treatment, Monitor vital signs and CBGs as ordered, Perform/monitor CIWA, COWS, AIMS and Fall Risk screenings as ordered, Perform wound care treatments as ordered.  Evaluation of Outcomes: Progressing   LCSW Treatment Plan for Primary Diagnosis: Bipolar I disorder, most recent episode mixed, severe with psychotic features ([place]) Long Term Goal(s): Safe transition to appropriate next level of care at discharge, Engage patient in therapeutic group addressing interpersonal concerns.  Short Term Goals: Engage patient in aftercare planning with referrals and resources, Increase social support and Increase skills for wellness and recovery  Therapeutic Interventions:  Assess for all discharge needs, 1 to 1 time with Social worker, Explore available resources and support systems, Assess for adequacy in community support  network, Educate family and significant other(s) on suicide prevention, Complete Psychosocial Assessment, Interpersonal group therapy.  Evaluation of Outcomes: Progressing   Progress in Treatment: Attending groups: Yes. Participating in groups: Yes. Taking medication as prescribed: Yes. Toleration medication: Yes. Family/Significant other contact made: No, will contact:  pt declined consent Patient understands diagnosis: Yes. Discussing patient identified problems/goals with staff: Yes. Medical problems stabilized or resolved: Yes. Denies suicidal/homicidal ideation: Yes. Issues/concerns per patient self-inventory: No. Other: none  New problem(s) identified: No, Describe:  none  New Short Term/Long Term Goal(s):  Patient Goals: Currently receiving ECT treatment  Discharge Plan or Barriers: TBD.  Pt is making some progress with ECT, but still quite irritable and demanding. Pt is also in need of securing a place to live that is appropriate and safe.  Reason for Continuation of Hospitalization: Depression, irritability, disorganization, refusing medications at times. Pt needs to continue ECT.  Estimated Length of Stay: 5-7 days.  Attendees: Patient: 10/10/2017   Physician: Orson Slick 10/10/2017   Nursing: Polly Cobia, RN 10/10/2017  RN Care Manager:   Social Worker:Christionna Poland LCSW 10/10/2017  Recreational Therapist: Roanna Epley, LRT 10/10/2017  Other: Darin Engels,  10/10/2017  Other: Marney Doctor Chaplain   Other:     Scribe for Treatment Team: August Saucer, LCSW 10/16/2017 4:17 PM

## 2017-10-16 NOTE — Progress Notes (Signed)
[facility] MD Progress Note  10/16/2017 6:46 PM Angela [name]  MRN:  [id number] Subjective: Follow-up for this patient with bipolar disorder.  Seen this afternoon she is a little more emotionally labile than she was last time I spoke with her.  Continues to be very somatic.  Nursing reports that she has continued to be intrusive and overly somatic but not aggressive or violent.  Patient does not describe herself as depressed but does talk about her mood still being labile and has some insight into the benefit she is getting from ECT.  We have still not been able to locate any kind of discharge plan Principal Problem: Bipolar I disorder, most recent episode mixed, severe with psychotic features [facility]) Diagnosis:   Patient Active Problem List   Diagnosis Date Noted  . Bipolar I disorder, most recent episode mixed, severe with psychotic features ([employer]) [F31.64] 09/18/2017  . Agitation [R45.1] 09/18/2017  . IBS (irritable bowel syndrome) [K58.9] 11/17/2016  . Bilateral carpal tunnel syndrome [G56.03] 11/17/2016  . Plantar fasciitis, bilateral [M72.2] 11/17/2016  . Chronic pain syndrome [G89.4] 11/17/2016  . Chronic hyponatremia [E87.1] 11/17/2016  . PTSD (post-traumatic stress disorder) [F43.10] 11/17/2016  . Hernia, hiatal [K44.9] 11/17/2016  . Vitamin D deficiency [E55.9] 11/17/2016  . Vitamin B12 deficiency [E53.8] 11/17/2016  . SIADH (syndrome of inappropriate ADH production) ([place]) [E22.2] 09/08/2016  . Cervical spondylosis with myelopathy [M47.12] 05/24/2016  . Post-concussion headache [G44.309] 05/24/2016  . MCI (mild cognitive impairment) with memory loss [G31.84] 03/30/2016  . Gait abnormality [R26.9] 03/30/2016  . Chronic bipolar affective disorder ([place]) [F31.9] 01/24/2016  . Anemia, iron deficiency [D50.9] 06/24/2015  . Endometriosis [N80.9] 06/24/2015  . Essential hypertension [I10] 06/24/2015  . History of postmenopausal bleeding [Z87.42] 05/22/2015  . History of TIA (transient  ischemic attack) [Z86.73] 04/20/2015  . Hypothyroidism [E03.9] 04/20/2015  . GERD (gastroesophageal reflux disease) [K21.9] 04/20/2015  . Cervical disc disorder with radiculopathy [M50.10] 03/25/2015  . Chronic neck pain [M54.2, G89.29] 03/25/2015  . Pelvic pain in female [R10.2] 10/30/2014  . History of skin cancer [Z85.828] 06/30/2014  . Chronic headaches [R51] 12/16/2013  . Neuropathy [G62.9] 12/16/2013   Total Time spent with patient: 30 minutes  Past Psychiatric History: Long history of bipolar disorder  Past Medical History:  Past Medical History:  Diagnosis Date  . Allergic rhinitis   . Anemia   . Blurred vision   . Depression   . Diverticulosis   . Endometriosis   . Falls   . GERD (gastroesophageal reflux disease)   . Hernia, hiatal   . Hypothyroidism   . IBS (irritable bowel syndrome)   . Malignant neoplasm of skin   . Migraine   . Neuropathy   . PTSD (post-traumatic stress disorder)   . Thyroid disease     Past Surgical History:  Procedure Laterality Date  . APPENDECTOMY    . CHOLECYSTECTOMY    . CHOLECYSTECTOMY, LAPAROSCOPIC    . ESOPHAGOGASTRODUODENOSCOPY (EGD) WITH PROPOFOL N/A 03/29/2015   Procedure: ESOPHAGOGASTRODUODENOSCOPY (EGD) WITH PROPOFOL;  Surgeon: Josefine Class, MD;  [facility] END[facility]COPY;  Service: Endoscopy;  Laterality: N/A;  . HERNIA REPAIR    . laproscopy    . TONSILLECTOMY    . uteral suspension     Family History:  Family History  Problem Relation Age of Onset  . Heart disease Father   . Cancer Mother        lung  . Urolithiasis Neg Hx   . Kidney disease Neg Hx   .  Kidney cancer Neg Hx   . Prostate cancer Neg Hx    Family Psychiatric  History: See previous note Social History:  Social History   Substance and Sexual Activity  Alcohol Use No     Social History   Substance and Sexual Activity  Drug Use No    Social History   Socioeconomic History  . Marital status: Single    Spouse name: Not on file  .  Number of children: 0  . Years of education: 1.5 years of college  . Highest education level: Not on file  Occupational History  . Occupation: Retired  Scientific laboratory technician  . Financial resource strain: Not on file  . Food insecurity:    Worry: Not on file    Inability: Not on file  . Transportation needs:    Medical: Not on file    Non-medical: Not on file  Tobacco Use  . Smoking status: Former Smoker    Last attempt to quit: 11/17/1976    Years since quitting: 40.9  . Smokeless tobacco: Never Used  Substance and Sexual Activity  . Alcohol use: No  . Drug use: No  . Sexual activity: Not Currently  Lifestyle  . Physical activity:    Days per week: Not on file    Minutes per session: Not on file  . Stress: Not on file  Relationships  . Social connections:    Talks on phone: Not on file    Gets together: Not on file    Attends religious service: Not on file    Active member of club or organization: Not on file    Attends meetings of clubs or organizations: Not on file    Relationship status: Not on file  Other Topics Concern  . Not on file  Social History Narrative   Lives at home alone.   Right-handed.   No daily caffeine use.   Additional Social History:    Pain Medications: See PTA Prescriptions: See PTA Over the Counter: See PTA History of alcohol / drug use?: No history of alcohol / drug abuse Longest period of sobriety (when/how long): Reports of none Negative Consequences of Use: (n/a) Withdrawal Symptoms: (n/a)                    Sleep: Fair  Appetite:  Good  Current Medications: Current Facility-Administered Medications  Medication Dose Route Frequency Provider Last Rate Last Dose  . acetaminophen (TYLENOL) tablet 650 mg  650 mg Oral Q6H PRN Diallo Ponder, Madie Reno, MD   650 mg at 10/16/17 1540  . albuterol (PROVENTIL) (2.5 MG/3ML) 0.083% nebulizer solution 2.5 mg  2.5 mg Inhalation Q6H PRN Johsua Shevlin T, MD      . alum & mag hydroxide-simeth  (MAALOX/MYLANTA) 200-200-20 MG/5ML suspension 30 mL  30 mL Oral Q4H PRN [place] Merida, Madie Reno, MD   30 mL at 10/08/17 1507  . amLODipine (NORVASC) tablet 2.5 mg  2.5 mg Oral BID Lisamarie Coke, Madie Reno, MD   2.5 mg at 10/16/17 1807  . calcium carbonate (TUMS - dosed in mg elemental calcium) chewable tablet 500 mg of elemental calcium  500 mg of elemental calcium Oral BID WC Pucilowska, Jolanta B, MD   500 mg of elemental calcium at 10/16/17 1724  . cholecalciferol (VITAMIN D) tablet 1,000 Units  1,000 Units Oral Daily Pucilowska, Jolanta B, MD   1,000 Units at 10/16/17 1217  . cycloSPORINE (RESTASIS) 0.05 % ophthalmic emulsion 1 drop  1 drop Both Eyes BID Jaklyn Alen,  Madie Reno, MD   1 drop at 10/16/17 1724  . diclofenac sodium (VOLTAREN) 1 % transdermal gel 4 g  4 g Topical QID Pucilowska, Jolanta B, MD   4 g at 10/16/17 1722  . docusate sodium (COLACE) capsule 100 mg  100 mg Oral BID Ammi Hutt, Madie Reno, MD   100 mg at 10/12/17 1733  . feeding supplement (BOOST / RESOURCE BREEZE) liquid 1 Container  1 Container Oral TID BM Jem Castro, Madie Reno, MD   1 Container at 10/16/17 1400  . fentaNYL (SUBLIMAZE) injection 25 mcg  25 mcg Intravenous Q5 min PRN Gunnar Bulla, MD      . fentaNYL (SUBLIMAZE) injection 25 mcg  25 mcg Intravenous Q5 min PRN Alvin Critchley, MD      . ferrous sulfate tablet 325 mg  325 mg Oral Q breakfast Pucilowska, Jolanta B, MD   325 mg at 10/16/17 1220  . gabapentin (NEURONTIN) capsule 100 mg  100 mg Oral BID Chardae Mulkern, Madie Reno, MD   100 mg at 10/16/17 1723  . glycopyrrolate (ROBINUL) injection 0.1 mg  0.1 mg Intravenous Once Gus Littler T, MD      . hydrocortisone cream 1 %   Topical BID Leelyn Jasinski, Madie Reno, MD   1 application at 40/10/27 1722  . levothyroxine (SYNTHROID, LEVOTHROID) tablet 100 mcg  100 mcg Oral QAC breakfast Pucilowska, Jolanta B, MD   100 mcg at 10/15/17 2536  . lidocaine (LIDODERM) 5 % 1 patch  1 patch Transdermal Q24H Pucilowska, Jolanta B, MD   1 patch at 10/16/17 1228  . linaclotide  (LINZESS) capsule 290 mcg  290 mcg Oral QAC breakfast Pucilowska, Jolanta B, MD   290 mcg at 10/15/17 0637  . lip balm (BLISTEX) ointment   Topical PRN Petrita Blunck T, MD      . loperamide (IMODIUM) capsule 2 mg  2 mg Oral PRN Pucilowska, Jolanta B, MD   2 mg at 10/15/17 1835  . LORazepam (ATIVAN) tablet 0.5 mg  0.5 mg Oral Q6H PRN Dontarious Schaum, Madie Reno, MD   0.5 mg at 10/15/17 2104  . magnesium hydroxide (MILK OF MAGNESIA) suspension 30 mL  30 mL Oral Daily PRN Zyren Sevigny, Madie Reno, MD   30 mL at 10/05/17 1731  . magnesium oxide (MAG-OX) tablet 200 mg  200 mg Oral BID Pucilowska, Jolanta B, MD   200 mg at 10/16/17 1723  . midazolam (VERSED) injection 2 mg  2 mg Intravenous Once Orest Dygert T, MD      . multivitamin with minerals tablet 1 tablet  1 tablet Oral Daily Pucilowska, Jolanta B, MD   1 tablet at 10/16/17 1225  . omega-3 acid ethyl esters (LOVAZA) capsule 1 g  1 g Oral BID Pucilowska, Jolanta B, MD   1 g at 10/16/17 1806  . ondansetron (ZOFRAN) injection 4 mg  4 mg Intravenous Once Tashonda Pinkus T, MD      . ondansetron [facility]) injection 4 mg  4 mg Intravenous Once PRN Gunnar Fusi, MD      . ondansetron [facility]) injection 4 mg  4 mg Intravenous Once PRN Gunnar Bulla, MD      . ondansetron [facility]) injection 4 mg  4 mg Intravenous Once PRN Alvin Critchley, MD      . ondansetron (ZOFRAN-ODT) disintegrating tablet 4 mg  4 mg Oral Q8H PRN Pucilowska, Jolanta B, MD   4 mg at 10/08/17 1807  . pantoprazole (PROTONIX) EC tablet 40 mg  40 mg Oral Daily Pucilowska, Jolanta  B, MD   40 mg at 10/15/17 4034  . PARoxetine (PAXIL-CR) 24 hr tablet 37.5 mg  37.5 mg Oral Daily Lenward Chancellor, MD   37.5 mg at 10/15/17 1412  . polyvinyl alcohol (LIQUIFILM TEARS) 1.4 % ophthalmic solution 1 drop  1 drop Both Eyes PRN Ramond Dial, MD   1 drop at 10/15/17 2048  . prazosin (MINIPRESS) capsule 2 mg  2 mg Oral QHS Carnie Bruemmer, Madie Reno, MD   2 mg at 10/15/17 2102  . promethazine (PHENERGAN) tablet 12.5 mg  12.5  mg Oral Q6H PRN Lacey Dotson T, MD   12.5 mg at 10/08/17 1647  . pyridOXINE (VITAMIN B-6) tablet 100 mg  100 mg Oral BID Angelos Wasco, Madie Reno, MD   100 mg at 10/16/17 1722  . tiZANidine (ZANAFLEX) tablet 2 mg  2 mg Oral Q8H PRN Pucilowska, Jolanta B, MD   2 mg at 10/12/17 1740  . traZODone (DESYREL) tablet 150 mg  150 mg Oral QHS Chauncey Mann, MD   150 mg at 10/15/17 2103  . triamcinolone cream (KENALOG) 0.1 %   Topical TID PRN Ramond Dial, MD   1 application at 74/25/95 2106    Lab Results:  Results for orders placed or performed during the hospital encounter of 09/19/17 (from the past 48 hour(s))  Comprehensive metabolic panel     Status: Abnormal   Collection Time: 10/15/17  6:42 AM  Result Value Ref Range   Sodium 140 135 - 145 mmol/L   Potassium 4.0 3.5 - 5.1 mmol/L   Chloride 107 98 - 111 mmol/L   CO2 23 22 - 32 mmol/L   Glucose, Bld 82 70 - 99 mg/dL   BUN 16 8 - 23 mg/dL   Creatinine, Ser 0.74 0.44 - 1.00 mg/dL   Calcium 8.9 8.9 - 10.3 mg/dL   Total Protein 6.2 (L) 6.5 - 8.1 g/dL   Albumin 3.4 (L) 3.5 - 5.0 g/dL   AST 24 15 - 41 U/L   ALT 19 0 - 44 U/L   Alkaline Phosphatase 59 38 - 126 U/L   Total Bilirubin 0.7 0.3 - 1.2 mg/dL   GFR calc non Af Amer >60 >60 mL/min   GFR calc Af Amer >60 >60 mL/min    Comment: (NOTE) The eGFR has been calculated using the CKD EPI equation. This calculation has not been validated in all clinical situations. eGFR's persistently <60 mL/min signify possible Chronic Kidney Disease.    Anion gap 10 5 - 15    Comment: Performed at [facility], [address] [place],  [address]    Blood Alcohol level:  Lab Results  Component Value Date   [facility] <5 09/29/2016   ETH <5 64/33/2951    Metabolic Disorder Labs: Lab Results  Component Value Date   HGBA1C 5.7 (H) 09/20/2017   MPG 116.89 09/20/2017   No results found for: PROLACTIN Lab Results  Component Value Date   CHOL 173 09/20/2017   TRIG 193 (H) 09/20/2017    HDL 50 09/20/2017   CHOLHDL 3.5 09/20/2017   VLDL 39 09/20/2017   LDLCALC 84 09/20/2017   LDLCALC 87 09/04/2017    Physical Findings: AIMS:  , ,  ,  ,    CIWA:    COWS:     Musculoskeletal: Strength & Muscle Tone: within normal limits Gait & Station: normal Patient leans: N/A  Psychiatric Specialty Exam: Physical Exam  Nursing note and vitals reviewed. Constitutional: She appears well-developed and well-nourished.  HENT:  Head: Normocephalic and atraumatic.  Eyes: Pupils are equal, round, and reactive to light. Conjunctivae are normal.  Neck: Normal range of motion.  Cardiovascular: Regular rhythm and normal heart sounds.  Respiratory: Effort normal. No respiratory distress.  GI: Soft.  Musculoskeletal: Normal range of motion.  Neurological: She is alert.  Skin: Skin is warm and dry.  Psychiatric: Her speech is normal. Her affect is labile. She is agitated. She is not aggressive. Thought content is not paranoid. She expresses impulsivity. She expresses no homicidal and no suicidal ideation. She exhibits abnormal recent memory.    Review of Systems  Constitutional: Negative.   HENT: Negative.   Eyes: Negative.   Respiratory: Negative.   Cardiovascular: Negative.   Gastrointestinal: Negative.   Musculoskeletal: Negative.   Skin: Negative.   Neurological: Negative.   Psychiatric/Behavioral: Negative for depression, hallucinations, memory loss, substance abuse and suicidal ideas. The patient is nervous/anxious and has insomnia.     Blood pressure 127/77, pulse 67, temperature 97.7 F (36.5 C), temperature source Oral, resp. rate 18, height '5\' 3"'$  (1.6 m), weight 59.9 kg, SpO2 100 %.Body mass index is 23.38 kg/m.  General Appearance: Disheveled  Eye Contact:  Fair  Speech:  Pressured  Volume:  Increased  Mood:  Dysphoric and Irritable  Affect:  Labile  Thought Process:  Disorganized  Orientation:  Full (Time, Place, and Person)  Thought Content:  Logical and  Rumination  Suicidal Thoughts:  No  Homicidal Thoughts:  No  Memory:  Immediate;   Fair Recent;   Poor Remote;   Poor  Judgement:  Fair  Insight:  Fair  Psychomotor Activity:  Normal  Concentration:  Concentration: Fair  Recall:  [name]:  Fair  Language:  Fair  Akathisia:  No  Handed:  Right  AIMS (if indicated):     Assets:  Desire for Improvement Resilience  ADL's:  Impaired  Cognition:  WNL  Sleep:  Number of Hours: 3.15     Treatment Plan Summary: Daily contact with patient to assess and evaluate symptoms and progress in treatment, Medication management and Plan Continue current medicine management.  Patient is very upset that her Paxil dose was altered.  She insists that she cannot tolerate 30 mg a day.  I will gladly take it back down to 20.  I think she would benefit from ECT.  Not clear if we will be able to provide treatment tomorrow morning or not.  I will place orders for n.p.o. if it looks like we will be able to do treatment tomorrow otherwise we will have treatment on Friday  Alethia Berthold, MD 10/16/2017, 6:46 PM
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
## 2017-10-16 NOTE — Plan of Care (Signed)
No  unit  attendance to programing . Unwilling to listen  of ways to cope . Compliant with medication  , denies suicidal  ideations Voice of no safety concerns  Problem: Education: Goal: Ability to make informed decisions regarding treatment will improve Outcome: Progressing   Problem: Coping: Goal: Coping ability will improve Outcome: Progressing   Problem: Health Behavior/Discharge Planning: Goal: Identification of resources available to assist in meeting health care needs will improve Outcome: Progressing   Problem: Medication: Goal: Compliance with prescribed medication regimen will improve Outcome: Progressing   Problem: Self-Concept: Goal: Ability to disclose and discuss suicidal ideas will improve Outcome: Progressing Goal: Will verbalize positive feelings about self Outcome: Progressing   Problem: Safety: Goal: Ability to remain free from injury will improve Outcome: Progressing

## 2017-10-16 NOTE — Progress Notes (Signed)
Recreation Therapy Notes  Date: 10/16/2017  Time: 9:30 am  Location: Craft Room  Behavioral response: Appropriate   Intervention Topic: Coping Skills  Discussion/Intervention:  Group content on today was focused on coping skills. The group defined what coping skills are and when they can be used. Individuals described how they normally cope with thing and the coping skills they normally use. Patients expressed why it is important to cope with things and how not coping with things can affect you. The group participated in the intervention "My coping box" and made coping boxes while adding coping skills they could use in the future to the box. Clinical Observations/Feedback:  Patient came to group late due to unknown reasons.Individual was social with peers and staff while participating in the intervention.  Careena Degraffenreid LRT/CTRS         Panagiotis Oelkers 10/16/2017 11:36 AM

## 2017-10-16 NOTE — Progress Notes (Signed)
Patient ID: Angela [name], female   DOB: Oct 23, 1950, 67 y.o.   MRN: [id number] CSW approached Pt letting her know that she would be able to meet with her per her request at lunch time.  CSW instructed Pt to gather up any phone numbers or information she would need and then we could meet and make whatever phone calls she felt necessary. Pt states "I've been waiting to talk to you for 2 weeks!  I'm sick now!", she becomes tangential and disorganized.  CSW redirects Pt tangent to ask her "Do you want to meet now or not?"  Pt says she cannot help she is sick and responded with irritbale tone, CSW wished her a great day and excused self to follow up on other Pt needs.  Dossie Arbour, LCSW

## 2017-10-16 NOTE — Progress Notes (Signed)
D: Pt denies SI/HI/AVH, verbally is able to contract for safety. Pt. Is hyper-somatic this evening reporting numerous somatic complaints. Pt. Reports broken ribs, but upon RN assessment is WDL, no distress in breathing. Pt. Chest rise is symmetrical. Pt. Continues to report her "scabbies rash" will not go away, because it needs to be treated properly by a specialist. Pt. Reports, "today has been the worst" and reports severe anxiety and depression. Pt. Given PRN medications for anxiety. Pt. Given PRN medications for her hand and reported "severily dry eyes". Pt. When asked does report relief of diarrhea from PRN medications given on previous shift. Pt. Given additional fluids (gatorade) and encouraged to rehydrate from reported diarrhea. Pt. Continues to dominate conversation and be argumentative with staff. Overall mood labile.        A: Q x 15 minute observation checks were completed for safety. Patient was provided with education, but needs reinforcement.  Patient was given/offered medications per orders. Patient  was encourage to attend groups, participate in unit activities and continue with plan of care. Pt. Chart and plans of care reviewed. Pt. Given support and encouragement.   R: Patient is complaint with medication and unit procedures with lots of redirection and encouragement. Pt. Refuses front wheel walker for safety, but has PRN for safety. Pt. Ambulating steady. Pt. Eating very good and lots of snacks throughout day, but reports poor appetite. Falls risk education given.             Precautionary checks every 15 minutes for safety maintained, room free of safety hazards, patient sustains no injury or falls during this shift. Will endorse care to next shift.

## 2017-10-16 NOTE — Progress Notes (Signed)
   10/16/17 1045  Clinical Encounter Type  Visited With Patient  Visit Type Follow-up;Psychological support;Spiritual support;Behavioral Health  Referral From Nurse  Consult/Referral To Chaplain  Spiritual Encounters  Spiritual Needs Prayer;Emotional;Other (Comment)   I received a page from the [facility] to come and speak with [name]. Angela [name] has been visited many times by many different [place]. She has a very similar story with each visit. She shared that no one likes her and she is alone. She wanted my advise if it was ok to take legal action on DSS. I told her that she should stand up for herself. I avoided giving a definite answer on the subject. We prayed and I departed the [place].
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
## 2017-10-16 NOTE — Plan of Care (Signed)
Pt. Complaint with medications. Pt. Denies SI/HI, verbally is able to contract for safety. Pt. Unable to verbalize positive feelings about self this evening.    Problem: Self-Concept: Goal: Will verbalize positive feelings about self Outcome: Not Progressing   Problem: Medication: Goal: Compliance with prescribed medication regimen will improve Outcome: Progressing   Problem: Self-Concept: Goal: Ability to disclose and discuss suicidal ideas will improve Outcome: Progressing   Problem: Safety: Goal: Ability to remain free from injury will improve Outcome: Progressing

## 2017-10-17 ENCOUNTER — Other Ambulatory Visit: Payer: Self-pay | Admitting: Psychiatry

## 2017-10-17 ENCOUNTER — Inpatient Hospital Stay: Payer: Medicare Other | Admitting: Anesthesiology

## 2017-10-17 ENCOUNTER — Inpatient Hospital Stay: Payer: Medicare Other

## 2017-10-17 LAB — GLUCOSE, CAPILLARY
Glucose-Capillary: 74 mg/dL (ref 70–99)
Glucose-Capillary: 71 mg/dL (ref 70–99)

## 2017-10-17 MED ORDER — CALCIUM CARBONATE ANTACID 500 MG PO CHEW
2.0000 | CHEWABLE_TABLET | Freq: Every day | ORAL | Status: DC
  Administered 2017-10-18 – 2017-11-05 (×13): 400 mg via ORAL
  Filled 2017-10-17 (×20): qty 2

## 2017-10-17 MED ORDER — PROMETHAZINE HCL 25 MG/ML IJ SOLN
12.5000 mg | Freq: Once | INTRAMUSCULAR | Status: AC
  Administered 2017-10-17: 12.5 mg via INTRAVENOUS
  Filled 2017-10-17: qty 1

## 2017-10-17 MED ORDER — SODIUM CHLORIDE 0.9 % IV SOLN
500.0000 mL | Freq: Once | INTRAVENOUS | Status: AC
  Administered 2017-10-17: 500 mL via INTRAVENOUS

## 2017-10-17 MED ORDER — METHOHEXITAL SODIUM 100 MG/10ML IV SOSY
PREFILLED_SYRINGE | INTRAVENOUS | Status: DC | PRN
  Administered 2017-10-17: 80 mg via INTRAVENOUS

## 2017-10-17 MED ORDER — ALBUTEROL SULFATE HFA 108 (90 BASE) MCG/ACT IN AERS
2.0000 | INHALATION_SPRAY | Freq: Four times a day (QID) | RESPIRATORY_TRACT | Status: DC | PRN
  Administered 2017-10-19: 2 via RESPIRATORY_TRACT
  Filled 2017-10-17 (×3): qty 6.7

## 2017-10-17 MED ORDER — SODIUM CHLORIDE 0.9 % IJ SOLN
INTRAMUSCULAR | Status: AC
  Filled 2017-10-17: qty 10

## 2017-10-17 MED ORDER — SUCCINYLCHOLINE CHLORIDE 200 MG/10ML IV SOSY
PREFILLED_SYRINGE | INTRAVENOUS | Status: DC | PRN
  Administered 2017-10-17: 80 mg via INTRAVENOUS

## 2017-10-17 MED ORDER — POLYETHYLENE GLYCOL 3350 17 G PO PACK
17.0000 g | PACK | Freq: Every day | ORAL | Status: DC
  Administered 2017-10-22 – 2017-11-05 (×7): 17 g via ORAL
  Filled 2017-10-17 (×10): qty 1

## 2017-10-17 MED ORDER — PROMETHAZINE HCL 25 MG/ML IJ SOLN
INTRAMUSCULAR | Status: AC
  Filled 2017-10-17: qty 1

## 2017-10-17 MED ORDER — LIDOCAINE HCL (PF) 2 % IJ SOLN
INTRAMUSCULAR | Status: DC | PRN
  Administered 2017-10-17: 60 mg via INTRADERMAL

## 2017-10-17 MED ORDER — ONDANSETRON HCL 4 MG/2ML IJ SOLN: 4.0000 mg | Freq: Once | INTRAMUSCULAR | Status: DC

## 2017-10-17 MED ORDER — SUCCINYLCHOLINE CHLORIDE 20 MG/ML IJ SOLN
INTRAMUSCULAR | Status: AC
  Filled 2017-10-17: qty 1

## 2017-10-17 MED ORDER — KETOROLAC TROMETHAMINE 30 MG/ML IJ SOLN
INTRAMUSCULAR | Status: AC
  Filled 2017-10-17: qty 1

## 2017-10-17 MED ORDER — KETOROLAC TROMETHAMINE 30 MG/ML IJ SOLN
30.0000 mg | Freq: Once | INTRAMUSCULAR | Status: AC
  Administered 2017-10-17: 30 mg via INTRAVENOUS

## 2017-10-17 MED ORDER — METHOHEXITAL SODIUM 0.5 G IJ SOLR
INTRAMUSCULAR | Status: AC
  Filled 2017-10-17: qty 500

## 2017-10-17 MED ORDER — ONDANSETRON HCL 4 MG/2ML IJ SOLN
INTRAMUSCULAR | Status: AC
  Filled 2017-10-17: qty 2

## 2017-10-17 MED ORDER — VITAMIN C 500 MG PO TABS
1000.0000 mg | ORAL_TABLET | Freq: Every day | ORAL | Status: DC
  Administered 2017-10-18 – 2017-11-06 (×20): 1000 mg via ORAL
  Filled 2017-10-17 (×20): qty 2

## 2017-10-17 NOTE — Progress Notes (Signed)
D: Pt denies SI/HI/AVH, verbally will contract for safety. Patient throughout the shift frequently up at the nursing station door aggressively knocking at the door inappropriately while yelling through the door intrusively demanding requests from staff and/or just complaining about her treatment. Significant comments of note during verbally aggressive behavior while at the door were, "Dr. Weber Cooks doesn't know his ass from his elbow!", "I haven't seen a Education officer, museum since I've been here and I've been here almost four weeks!", "none of my physical needs have been addressed at all!". Pt. Was attempted to be redirected several times from inappropriately banging on the nursing station window and yelling innappropriately. Pt. After being finally redirected from nursing station proceeds to go into the day room and complain loudly utilizing profanity about staff, upsetting other patient's in the milieu. Pt. While in the day room being redirected by another RN while this RN was contacting on call MD for orders, proceeds to state, "you're being a bitch, get out of here!" when being educated that she has been seeing a Education officer, museum and her behavior is inappropriate. On call MD orders received for medications to reduce discomforts which over some top with night medications reduced agitations and discomforts. Pt. While being offered on call MD medications for agitations and discomforts states, "I'll take my medications, but I'm not taking them from you!", "get out of my room or I'll be kicking you right in the nuts!" (patient proceeds to hold her foot in the air). Pt. Given ordered medications with aid of additional staff with patient compliance after encouragement and support given.  Pt. Continues to report severe pain this evening. Pt. Reports her ribs are, "all broken" and she needs to have them, "seriously looked into". Pt. Breathing is normal with no distress in breathing noted. Pt. Chest rise and fall symmetrical. Pt.  Continues to report, "scabbies all over my hands, I can't believe this hasn't been a priority!". Pt. Not eating her meals throughout the day, but has been reportedly and observed eating lots of snacks often. Pt. Continues to have poor use of front wheel walker despite demanding to use at times. Pt. Ambulating approprietly, steady.    A: Q x 15 minute observation checks were completed for safety. Patient was provided with education, but refuses.  Patient was given/offered medications per orders. Patient  was encourage to attend groups, participate in unit activities and continue with plan of care. Pt. Chart and plans of care reviewed. Pt. Given support and encouragement frequently.   R: Patient is complaint with medication and unit procedures only with several requests for compliance. Pt. Requires frequent redirection from inappropriate behaviors. Pt. blood sugars to be monitored for pre-ect for MD orders. Pt. NPO maintained after midnight per MD orders.               Precautionary checks every 15 minutes for safety maintained, room free of safety hazards, patient sustains no injury or falls during this shift. Will endorse care to next shift.

## 2017-10-17 NOTE — Procedures (Signed)
ECT SERVICES Physician's Interval Evaluation & Treatment Note  Patient Identification: Angela [name] MRN:  [id number] Date of Evaluation:  10/17/2017 TX #: [id number]  MADRS:   MMSE: 30  P.E. Findings:  No change to physical exam  Psychiatric Interval Note:  She was doing well last night but today she was back to being very irritable  Subjective:  Patient is a 67 y.o. female seen for evaluation for Electroconvulsive Therapy. Back to complaining about everything  Treatment Summary:   [x]   Right Unilateral             []  Bilateral   % Energy : 0.3 ms 70%   Impedance: 1660 ohms  Seizure Energy Index: 6140 V squared  Postictal Suppression Index: 87%  Seizure Concordance Index: No reading  Medications  Pre Shock: Toradol 30 mg Phenergan 12.5 mg Brevital 60 mg succinylcholine 80 mg  Post Shock: None  Seizure Duration: 6 seconds by EMG, 9 seconds by EEG   Comments: I tried a seconds stimulus at 100% and that produced no seizure to speak of at all.  We will have to reevaluate her medicine and the utility of further treatment.  No treatment on Friday but if she is still here on Monday we may try again only using ketamine as anesthetic  Lungs:  [x]   Clear to auscultation               []  Other:   Heart:    [x]   Regular rhythm             []  irregular rhythm    [x]   Previous H&P reviewed, patient examined and there are NO CHANGES                 []   Previous H&P reviewed, patient examined and there are changes noted.   Alethia Berthold, MD 10/2/201910:37 AM

## 2017-10-17 NOTE — Plan of Care (Signed)
Pt. Denies SI/Hi, verbally able to contract for safety.  Pt. Is not complaint with medications this evening, requiring several requests for compliance before patient is willing to take her medications. Pt. Continues to demand to take medications when she wants and not at scheduled times per orders. Pt. Attitude is very negative this evening and is unable to verbalize any positive feelings. Pt. Presents this evening with decreased coping and ability to maintain control.    Problem: Education: Goal: Ability to make informed decisions regarding treatment will improve Outcome: Not Progressing   Problem: Coping: Goal: Coping ability will improve Outcome: Not Progressing   Problem: Health Behavior/Discharge Planning: Goal: Identification of resources available to assist in meeting health care needs will improve Outcome: Not Progressing   Problem: Medication: Goal: Compliance with prescribed medication regimen will improve Outcome: Not Progressing   Problem: Self-Concept: Goal: Will verbalize positive feelings about self Outcome: Not Progressing   Problem: Safety: Goal: Ability to remain free from injury will improve Outcome: Progressing

## 2017-10-17 NOTE — Progress Notes (Signed)
Patient refused her evening medications because she is upset with MD because "nobody is reading any of the notes that I left for him". Patient is upset stating to this writer that she wants something other than Tums for her calcium supplement, she hasn't received her nasal spray that she's requested, as well as her inhaler because "I have COPD and I was getting a nebulizer, I'm short of breath". Patient threw her hands up and stated "I'm not taking any of it because nobody is listening to me" and she stormed off from the medication room and slammed her room door. This Probation officer paged MD to notify him of patient's requests and refusal, and waiting for returned phone call.

## 2017-10-17 NOTE — [facility] Group Notes (Signed)
10/16/2017 1PM  Type of Therapy/Topic:  Group Therapy:  Feelings about Diagnosis  Participation Level:  Did Not Attend   Description of Group:   This group will allow patients to explore their thoughts and feelings about diagnoses they have received. Patients will be guided to explore their level of understanding and acceptance of these diagnoses. Facilitator will encourage patients to process their thoughts and feelings about the reactions of others to their diagnosis and will guide patients in identifying ways to discuss their diagnosis with significant others in their lives. This group will be process-oriented, with patients participating in exploration of their own experiences, giving and receiving support, and processing challenge from other group members.   Therapeutic Goals: 1. Patient will demonstrate understanding of diagnosis as evidenced by identifying two or more symptoms of the disorder 2. Patient will be able to express two feelings regarding the diagnosis 3. Patient will demonstrate their ability to communicate their needs through discussion and/or role play  Summary of Patient Progress: Patient was encouraged and invited to attend group. Patient did not attend group. Social worker will continue to encourage group participation in the future.        Therapeutic Modalities:   Cognitive Behavioral Therapy Brief Therapy Feelings Identification    Darin Engels, LCSW 10/17/2017 11:31 AM

## 2017-10-17 NOTE — H&P (Signed)
Angela [name] is an 67 y.o. female.   Chief Complaint: Being irritable and moody HPI: Patient with chronic bipolar disorder with often mixed to irritable features.  Past Medical History:  Diagnosis Date  . Allergic rhinitis   . Anemia   . Blurred vision   . Depression   . Diverticulosis   . Endometriosis   . Falls   . GERD (gastroesophageal reflux disease)   . Hernia, hiatal   . Hypothyroidism   . IBS (irritable bowel syndrome)   . Malignant neoplasm of skin   . Migraine   . Neuropathy   . PTSD (post-traumatic stress disorder)   . Thyroid disease     Past Surgical History:  Procedure Laterality Date  . APPENDECTOMY    . CHOLECYSTECTOMY    . CHOLECYSTECTOMY, LAPAROSCOPIC    . ESOPHAGOGASTRODUODENOSCOPY (EGD) WITH PROPOFOL N/A 03/29/2015   Procedure: ESOPHAGOGASTRODUODENOSCOPY (EGD) WITH PROPOFOL;  Surgeon: Josefine Class, MD;  Location: [facility] ENDOSCOPY;  Service: Endoscopy;  Laterality: N/A;  . HERNIA REPAIR    . laproscopy    . TONSILLECTOMY    . uteral suspension      Family History  Problem Relation Age of Onset  . Heart disease Father   . Cancer Mother        lung  . Urolithiasis Neg Hx   . Kidney disease Neg Hx   . Kidney cancer Neg Hx   . Prostate cancer Neg Hx    Social History:  reports that she quit smoking about 40 years ago. She has never used smokeless tobacco. She reports that she does not drink alcohol or use drugs.  Allergies:  Allergies  Allergen Reactions  . Bee Venom Hives  . Darvon [Propoxyphene] Anaphylaxis and Hives    Hives/throat swelling   . Dicyclomine Itching  . Other Hives    Spider bites cause hives  . Oxycodone-Acetaminophen Nausea And Vomiting and Other (See Comments)    hallucinations   . Peanuts [Peanut Oil] Anaphylaxis and Hives  . Penicillins Hives    Has patient had a PCN reaction causing immediate rash, facial/tongue/throat swelling, SOB or lightheadedness with hypotension: Unknown Has patient had a PCN  reaction causing severe rash involving mucus membranes or skin necrosis: Unknown Has patient had a PCN reaction that required hospitalization: Unknown Has patient had a PCN reaction occurring within the last 10 years: Unknown If all of the above answers are "NO", then may proceed with Cephalosporin use.  . Sulfa Antibiotics Anaphylaxis, Hives and Itching  . Sulfacetamide Sodium Anaphylaxis, Hives and Itching  . Ultram [Tramadol] Hives  . Amikacin Nausea And Vomiting  . Aspirin Hives  . Doxycycline Photosensitivity  . Haloperidol Nausea And Vomiting  . Hymenoptera Venom Preparations Hives    Reaction to spider bites  . Imipramine Hives    Reaction to Tofranil  . Keflex [Cephalexin] Hives  . Lactose Intolerance (Gi)   . Sulfur Hives  . Adhesive [Tape] Hives and Rash  . Hydroxyzine Anxiety and Itching  . Prednisone Nausea And Vomiting, Anxiety and Other (See Comments)    Crazy mood swings, strange thoughts (pt does tolerate cortisone injections)    Medications Prior to Admission  Medication Sig Dispense Refill  . amLODipine (NORVASC) 5 MG tablet Take 1 tablet (5 mg total) by mouth 2 (two) times daily. 180 tablet 1  . ANORO ELLIPTA 62.5-25 MCG/INH AEPB Inhale 1 puff into the lungs daily. 14 each 0  . calcium carbonate (OS-CAL) 600 MG TABS tablet Take 1  tablet (600 mg total) by mouth 2 (two) times daily with a meal. (Patient taking differently: Take 600 mg 3 (three) times daily by mouth. ) 60 tablet 0  . cholecalciferol (VITAMIN D) 1000 units tablet Take 4,000 Units by mouth daily.    . citalopram (CELEXA) 20 MG tablet Take 20 mg by mouth daily.  0  . conjugated estrogens (PREMARIN) vaginal cream Place vaginally 3 (three) times a week. Use 1/2 gram 3-4 times weekly. 42.5 g 4  . cycloSPORINE (RESTASIS) 0.05 % ophthalmic emulsion Place 1 drop into both eyes 2 (two) times daily. 60 each 5  . diclofenac sodium (VOLTAREN) 1 % GEL Apply 2 g topically 4 (four) times daily.    . ferrous sulfate  (FERROUSUL) 325 (65 FE) MG tablet Take 1 tablet (325 mg total) by mouth daily with breakfast. 90 tablet 1  . gabapentin (NEURONTIN) 100 MG capsule Take 1-2 capsules (100-200 mg total) by mouth 3 (three) times daily. (Patient taking differently: Take 100 mg by mouth 2 (two) times daily. ) 540 capsule 1  . guaiFENesin (MUCINEX) 600 MG 12 hr tablet Take 1 tablet (600 mg total) by mouth 2 (two) times daily as needed for cough. (Patient taking differently: Take 600 mg by mouth daily. ) 30 tablet 0  . hydrocortisone (PROCTOZONE-HC) 2.5 % rectal cream Place 1 application rectally 2 (two) times daily. 30 g 1  . hydrOXYzine (ATARAX/VISTARIL) 10 MG tablet Take 1 tablet by mouth 2 (two) times daily as needed for anxiety.     Marland Kitchen KRILL OIL OMEGA-3 PO Take 350 mg by mouth daily.    Marland Kitchen levalbuterol (XOPENEX) 1.25 MG/3ML nebulizer solution Inhale 3 mLs into the lungs daily.    Marland Kitchen levothyroxine (SYNTHROID, LEVOTHROID) 100 MCG tablet Take 1 tablet (100 mcg total) by mouth daily before breakfast. 90 tablet 1  . linaclotide (LINZESS) 290 MCG CAPS capsule Take 1 capsule (290 mcg total) by mouth daily before breakfast. 90 capsule 1  . magnesium oxide (MAG-OX) 400 MG tablet Take 400 mg by mouth daily with breakfast.    . montelukast (SINGULAIR) 10 MG tablet Take 1 tablet by mouth at bedtime.  11  . omeprazole (PRILOSEC) 20 MG capsule Take 1 capsule by mouth daily.    Vladimir Faster Glycol-Propyl Glycol (SYSTANE OP) Place 1 drop into both eyes daily as needed (dry eyes).    . polyethylene glycol powder (GLYCOLAX/MIRALAX) powder Take 17-34 g by mouth daily. 1700 g 5  . Probiotic Product (PROBIOTIC PO) Take 1 tablet by mouth daily.    . promethazine (PHENERGAN) 25 MG tablet Take 0.5 tablets (12.5 mg total) by mouth every 6 (six) hours as needed for nausea or vomiting. 30 tablet 0  . Pyridoxine HCl (VITAMIN B-6) 500 MG tablet Take 1 tablet by mouth 2 (two) times daily.    . ranitidine (ZANTAC) 150 MG tablet Take 150 mg by mouth  daily before supper.    Marland Kitchen tiZANidine (ZANAFLEX) 2 MG tablet Take 0.5-1 tablets (1-2 mg total) by mouth 2 (two) times daily as needed for muscle spasms. 180 tablet 1  . tolterodine (DETROL LA) 2 MG 24 hr capsule Take 1 capsule (2 mg total) daily by mouth. 30 capsule 11  . traZODone (DESYREL) 50 MG tablet Take 50 mg by mouth at bedtime.      Results for orders placed or performed during the hospital encounter of 09/19/17 (from the past 48 hour(s))  Glucose, capillary     Status: None   Collection Time:  10/17/17  6:31 AM  Result Value Ref Range   Glucose-Capillary 74 70 - 99 mg/dL   Comment 1 Notify RN    No results found.  Review of Systems  Constitutional: Negative.   HENT: Negative.   Eyes: Negative.   Respiratory: Negative.   Cardiovascular: Negative.   Gastrointestinal: Negative.   Musculoskeletal: Negative.   Skin: Negative.   Neurological: Negative.   Psychiatric/Behavioral: Positive for depression. Negative for hallucinations, memory loss, substance abuse and suicidal ideas. The patient is nervous/anxious and has insomnia.     Blood pressure (!) 146/84, pulse 62, temperature 98 F (36.7 C), temperature source Oral, resp. rate 18, height 5\' 3"  (1.6 m), weight 53.1 kg, SpO2 98 %. Physical Exam   Assessment/Plan Had been showing improvement but today is more grumpy and irritable again.  We will do treatment today probably once a week treatment would be helpful if we could manage it  Alethia Berthold, MD 10/17/2017, 10:35 AM

## 2017-10-17 NOTE — Progress Notes (Signed)
Patient wants the Colace discontinued.

## 2017-10-17 NOTE — Anesthesia Post-op Follow-up Note (Signed)
Anesthesia QCDR form completed.        

## 2017-10-17 NOTE — Progress Notes (Signed)
[facility] MD Progress Note  10/17/2017 5:40 PM Angela [name]  MRN:  [id number] Subjective: Follow-up for patient with bipolar disorder.  She had ECT today.  There was a very short seizure today for unclear reasons.  Not able to get a full 22nd seizure.  Patient is complaining of more mood lability and has been more irritable in the last day or so.  Not having suicidal ideation.  Still has multiple somatic complaints usually of no significance Principal Problem: Bipolar I disorder, most recent episode mixed, severe with psychotic features ([place]) Diagnosis:   Patient Active Problem List   Diagnosis Date Noted  . Bipolar I disorder, most recent episode mixed, severe with psychotic features ([place]) [F31.64] 09/18/2017  . Agitation [R45.1] 09/18/2017  . IBS (irritable bowel syndrome) [K58.9] 11/17/2016  . Bilateral carpal tunnel syndrome [G56.03] 11/17/2016  . Plantar fasciitis, bilateral [M72.2] 11/17/2016  . Chronic pain syndrome [G89.4] 11/17/2016  . Chronic hyponatremia [E87.1] 11/17/2016  . PTSD (post-traumatic stress disorder) [F43.10] 11/17/2016  . Hernia, hiatal [K44.9] 11/17/2016  . Vitamin D deficiency [E55.9] 11/17/2016  . Vitamin B12 deficiency [E53.8] 11/17/2016  . SIADH (syndrome of inappropriate ADH production) ([place]) [E22.2] 09/08/2016  . Cervical spondylosis with myelopathy [M47.12] 05/24/2016  . Post-concussion headache [G44.309] 05/24/2016  . MCI (mild cognitive impairment) with memory loss [G31.84] 03/30/2016  . Gait abnormality [R26.9] 03/30/2016  . Chronic bipolar affective disorder ([place]) [F31.9] 01/24/2016  . Anemia, iron deficiency [D50.9] 06/24/2015  . Endometriosis [N80.9] 06/24/2015  . Essential hypertension [I10] 06/24/2015  . History of postmenopausal bleeding [Z87.42] 05/22/2015  . History of TIA (transient ischemic attack) [Z86.73] 04/20/2015  . Hypothyroidism [E03.9] 04/20/2015  . GERD (gastroesophageal reflux disease) [K21.9] 04/20/2015  . Cervical disc  disorder with radiculopathy [M50.10] 03/25/2015  . Chronic neck pain [M54.2, G89.29] 03/25/2015  . Pelvic pain in female [R10.2] 10/30/2014  . History of skin cancer [Z85.828] 06/30/2014  . Chronic headaches [R51] 12/16/2013  . Neuropathy [G62.9] 12/16/2013   Total Time spent with patient: 30 minutes  Past Psychiatric History: Long-standing bipolar disorder  Past Medical History:  Past Medical History:  Diagnosis Date  . Allergic rhinitis   . Anemia   . Blurred vision   . Depression   . Diverticulosis   . Endometriosis   . Falls   . GERD (gastroesophageal reflux disease)   . Hernia, hiatal   . Hypothyroidism   . IBS (irritable bowel syndrome)   . Malignant neoplasm of skin   . Migraine   . Neuropathy   . PTSD (post-traumatic stress disorder)   . Thyroid disease     Past Surgical History:  Procedure Laterality Date  . APPENDECTOMY    . CHOLECYSTECTOMY    . CHOLECYSTECTOMY, LAPAROSCOPIC    . ESOPHAGOGASTRODUODENOSCOPY (EGD) WITH PROPOFOL N/A 03/29/2015   Procedure: ESOPHAGOGASTRODUODENOSCOPY (EGD) WITH PROPOFOL;  Surgeon: Josefine Class, MD;  Location: [facility] ENDOSCOPY;  Service: Endoscopy;  Laterality: N/A;  . HERNIA REPAIR    . laproscopy    . TONSILLECTOMY    . uteral suspension     Family History:  Family History  Problem Relation Age of Onset  . Heart disease Father   . Cancer Mother        lung  . Urolithiasis Neg Hx   . Kidney disease Neg Hx   . Kidney cancer Neg Hx   . Prostate cancer Neg Hx    Family Psychiatric  History: See previous note Social History:  Social History   Substance and Sexual  Activity  Alcohol Use No     Social History   Substance and Sexual Activity  Drug Use No    Social History   Socioeconomic History  . Marital status: Single    Spouse name: Not on file  . Number of children: 0  . Years of education: 1.5 years of college  . Highest education level: Not on file  Occupational History  . Occupation: Retired   Scientific laboratory technician  . Financial resource strain: Not on file  . Food insecurity:    Worry: Not on file    Inability: Not on file  . Transportation needs:    Medical: Not on file    Non-medical: Not on file  Tobacco Use  . Smoking status: Former Smoker    Last attempt to quit: 11/17/1976    Years since quitting: 40.9  . Smokeless tobacco: Never Used  Substance and Sexual Activity  . Alcohol use: No  . Drug use: No  . Sexual activity: Not Currently  Lifestyle  . Physical activity:    Days per week: Not on file    Minutes per session: Not on file  . Stress: Not on file  Relationships  . Social connections:    Talks on phone: Not on file    Gets together: Not on file    Attends religious service: Not on file    Active member of club or organization: Not on file    Attends meetings of clubs or organizations: Not on file    Relationship status: Not on file  Other Topics Concern  . Not on file  Social History Narrative   Lives at home alone.   Right-handed.   No daily caffeine use.   Additional Social History:    Pain Medications: See PTA Prescriptions: See PTA Over the Counter: See PTA History of alcohol / drug use?: No history of alcohol / drug abuse Longest period of sobriety (when/how long): Reports of none Negative Consequences of Use: (n/a) Withdrawal Symptoms: (n/a)                    Sleep: Poor  Appetite:  Fair  Current Medications: Current Facility-Administered Medications  Medication Dose Route Frequency Provider Last Rate Last Dose  . acetaminophen (TYLENOL) tablet 650 mg  650 mg Oral Q6H PRN Clapacs, Madie Reno, MD   650 mg at 10/17/17 1154  . albuterol (PROVENTIL) (2.5 MG/3ML) 0.083% nebulizer solution 2.5 mg  2.5 mg Inhalation Q6H PRN Clapacs, John T, MD      . alum & mag hydroxide-simeth (MAALOX/MYLANTA) 200-200-20 MG/5ML suspension 30 mL  30 mL Oral Q4H PRN Clapacs, Madie Reno, MD   30 mL at 10/08/17 1507  . amLODipine (NORVASC) tablet 2.5 mg  2.5 mg  Oral BID Clapacs, Madie Reno, MD   2.5 mg at 10/17/17 0813  . calcium carbonate (TUMS - dosed in mg elemental calcium) chewable tablet 500 mg of elemental calcium  500 mg of elemental calcium Oral BID WC Pucilowska, Jolanta B, MD   500 mg of elemental calcium at 10/16/17 1724  . cholecalciferol (VITAMIN D) tablet 1,000 Units  1,000 Units Oral Daily Pucilowska, Jolanta B, MD   1,000 Units at 10/17/17 1201  . cycloSPORINE (RESTASIS) 0.05 % ophthalmic emulsion 1 drop  1 drop Both Eyes BID Clapacs, Madie Reno, MD   1 drop at 10/17/17 0813  . diclofenac sodium (VOLTAREN) 1 % transdermal gel 4 g  4 g Topical QID Pucilowska, Wardell Honour, MD  4 g at 10/17/17 1154  . feeding supplement (BOOST / RESOURCE BREEZE) liquid 1 Container  1 Container Oral TID BM Clapacs, Madie Reno, MD   1 Container at 10/16/17 2033  . fentaNYL (SUBLIMAZE) injection 25 mcg  25 mcg Intravenous Q5 min PRN Gunnar Bulla, MD      . fentaNYL (SUBLIMAZE) injection 25 mcg  25 mcg Intravenous Q5 min PRN Alvin Critchley, MD      . ferrous sulfate tablet 325 mg  325 mg Oral Q breakfast Pucilowska, Jolanta B, MD   325 mg at 10/17/17 1201  . gabapentin (NEURONTIN) capsule 100 mg  100 mg Oral BID Clapacs, Madie Reno, MD   100 mg at 10/17/17 1201  . glycopyrrolate (ROBINUL) injection 0.1 mg  0.1 mg Intravenous Once Clapacs, John T, MD      . hydrocortisone cream 1 %   Topical BID Clapacs, Madie Reno, MD   1 application at 38/10/17 1722  . ketorolac (TORADOL) 30 MG/ML injection           . levothyroxine (SYNTHROID, LEVOTHROID) tablet 100 mcg  100 mcg Oral QAC breakfast Pucilowska, Jolanta B, MD   100 mcg at 10/17/17 1202  . lidocaine (LIDODERM) 5 % 1 patch  1 patch Transdermal Q24H Pucilowska, Jolanta B, MD   1 patch at 10/16/17 1228  . linaclotide (LINZESS) capsule 290 mcg  290 mcg Oral QAC breakfast Pucilowska, Jolanta B, MD   290 mcg at 10/17/17 1203  . lip balm (BLISTEX) ointment   Topical PRN Clapacs, John T, MD      . loperamide (IMODIUM) capsule 2 mg  2 mg Oral  PRN Pucilowska, Jolanta B, MD   2 mg at 10/15/17 1835  . LORazepam (ATIVAN) tablet 0.5 mg  0.5 mg Oral Q6H PRN Clapacs, Madie Reno, MD   0.5 mg at 10/15/17 2104  . magnesium hydroxide (MILK OF MAGNESIA) suspension 30 mL  30 mL Oral Daily PRN Clapacs, Madie Reno, MD   30 mL at 10/05/17 1731  . magnesium oxide (MAG-OX) tablet 200 mg  200 mg Oral BID Pucilowska, Jolanta B, MD   200 mg at 10/17/17 1204  . midazolam (VERSED) injection 2 mg  2 mg Intravenous Once Clapacs, John T, MD      . multivitamin with minerals tablet 1 tablet  1 tablet Oral Daily Pucilowska, Jolanta B, MD   1 tablet at 10/17/17 1200  . omega-3 acid ethyl esters (LOVAZA) capsule 1 g  1 g Oral BID Pucilowska, Jolanta B, MD   1 g at 10/17/17 1205  . ondansetron (ZOFRAN) injection 4 mg  4 mg Intravenous Once Clapacs, John T, MD      . ondansetron [facility]) injection 4 mg  4 mg Intravenous Once PRN Gunnar Fusi, MD      . ondansetron [facility]) injection 4 mg  4 mg Intravenous Once PRN Gunnar Bulla, MD      . ondansetron [facility]) injection 4 mg  4 mg Intravenous Once PRN Alvin Critchley, MD      . ondansetron [facility]) injection 4 mg  4 mg Intravenous Once Clapacs, John T, MD      . ondansetron (ZOFRAN-ODT) disintegrating tablet 4 mg  4 mg Oral Q8H PRN Pucilowska, Jolanta B, MD   4 mg at 10/08/17 1807  . pantoprazole (PROTONIX) EC tablet 40 mg  40 mg Oral Daily Pucilowska, Jolanta B, MD   40 mg at 10/15/17 5102  . PARoxetine (PAXIL-CR) 24 hr tablet 25 mg  25  mg Oral Daily Clapacs, Madie Reno, MD   25 mg at 10/17/17 1204  . polyvinyl alcohol (LIQUIFILM TEARS) 1.4 % ophthalmic solution 1 drop  1 drop Both Eyes PRN Ramond Dial, MD   1 drop at 10/15/17 2048  . prazosin (MINIPRESS) capsule 2 mg  2 mg Oral QHS Clapacs, Madie Reno, MD   2 mg at 10/16/17 2311  . promethazine (PHENERGAN) 25 MG/ML injection           . promethazine (PHENERGAN) tablet 12.5 mg  12.5 mg Oral Q6H PRN Clapacs, John T, MD   12.5 mg at 10/08/17 1647  . pyridOXINE (VITAMIN  B-6) tablet 100 mg  100 mg Oral BID Clapacs, Madie Reno, MD   100 mg at 10/17/17 1205  . tiZANidine (ZANAFLEX) tablet 2 mg  2 mg Oral Q8H PRN Pucilowska, Jolanta B, MD   2 mg at 10/12/17 1740  . traZODone (DESYREL) tablet 150 mg  150 mg Oral QHS Chauncey Mann, MD   150 mg at 10/16/17 2310  . triamcinolone cream (KENALOG) 0.1 %   Topical TID PRN Ramond Dial, MD   1 application at 91/69/45 2106    Lab Results:  Results for orders placed or performed during the hospital encounter of 09/19/17 (from the past 48 hour(s))  Glucose, capillary     Status: None   Collection Time: 10/17/17  6:31 AM  Result Value Ref Range   Glucose-Capillary 74 70 - 99 mg/dL   Comment 1 Notify RN   Glucose, capillary     Status: None   Collection Time: 10/17/17  9:32 AM  Result Value Ref Range   Glucose-Capillary 71 70 - 99 mg/dL    Blood Alcohol level:  Lab Results  Component Value Date   ETH <5 09/29/2016   ETH <5 03/88/8280    Metabolic Disorder Labs: Lab Results  Component Value Date   HGBA1C 5.7 (H) 09/20/2017   MPG 116.89 09/20/2017   No results found for: PROLACTIN Lab Results  Component Value Date   CHOL 173 09/20/2017   TRIG 193 (H) 09/20/2017   HDL 50 09/20/2017   CHOLHDL 3.5 09/20/2017   VLDL 39 09/20/2017   LDLCALC 84 09/20/2017   LDLCALC 87 09/04/2017    Physical Findings: AIMS:  , ,  ,  ,    CIWA:    COWS:     Musculoskeletal: Strength & Muscle Tone: within normal limits Gait & Station: normal Patient leans: N/A  Psychiatric Specialty Exam: Physical Exam  Nursing note and vitals reviewed. Constitutional: She appears well-developed and well-nourished.  HENT:  Head: Normocephalic and atraumatic.  Eyes: Pupils are equal, round, and reactive to light. Conjunctivae are normal.  Neck: Normal range of motion.  Cardiovascular: Regular rhythm and normal heart sounds.  Respiratory: Effort normal. No respiratory distress.  GI: Soft.  Musculoskeletal: Normal range of  motion.  Neurological: She is alert.  Skin: Skin is warm and dry.  Psychiatric: She has a normal mood and affect. Her behavior is normal. Judgment and thought content normal.    Review of Systems  Constitutional: Negative.   HENT: Negative.   Eyes: Negative.   Respiratory: Negative.   Cardiovascular: Negative.   Gastrointestinal: Negative.   Musculoskeletal: Negative.   Skin: Negative.   Neurological: Negative.   Psychiatric/Behavioral: Negative.     Blood pressure (!) 153/78, pulse (!) 59, temperature 97.9 F (36.6 C), temperature source Oral, resp. rate 16, height 5\' 3"  (1.6 m), weight 53.1 kg, SpO2 100 %.  Body mass index is 20.73 kg/m.  General Appearance: Casual  Eye Contact:  Fair  Speech:  Slow  Volume:  Decreased  Mood:  Depressed  Affect:  Congruent  Thought Process:  Goal Directed  Orientation:  Full (Time, Place, and Person)  Thought Content:  Logical  Suicidal Thoughts:  No  Homicidal Thoughts:  No  Memory:  Immediate;   Fair Recent;   Fair Remote;   Fair  Judgement:  Fair  Insight:  Fair  Psychomotor Activity:  Decreased  Concentration:  Concentration: Fair  Recall:  [name]:  Fair  Language:  Fair  Akathisia:  No  Handed:  Right  AIMS (if indicated):     Assets:  Desire for Improvement Physical Health  ADL's:  Impaired  Cognition:  Impaired,  Mild  Sleep:  Number of Hours: 5.3     Treatment Plan Summary: Daily contact with patient to assess and evaluate symptoms and progress in treatment, Medication management and Plan Patient had ECT today and unfortunately was not able to have a full seizure.  If she is still in the hospital on Monday we will try again and use ketamine as anesthetic.  Meanwhile she is complaining of not wanting to continue taking her Colace and I will discontinue that.  No other clear indication to change any specific psychiatric medicine.  Continue encouraging patient to talk with full treatment team about discharge  planning.  Alethia Berthold, MD 10/17/2017, 5:40 PM

## 2017-10-17 NOTE — Progress Notes (Signed)
This Probation officer was informed by a MHT that patient stated she is going to refuse ECT from here on out because MD is not listening to her. Patient just came and asked if patient has responded to this writer's page and this writer told her no, so patient stated that she's going to call and report MD.

## 2017-10-17 NOTE — Anesthesia Postprocedure Evaluation (Signed)
Anesthesia Post Note  Patient: Angela [name]  Procedure(s) Performed: ECT TX  Patient location during evaluation: PACU Anesthesia Type: General Level of consciousness: awake and alert and oriented Pain management: pain level controlled Vital Signs Assessment: post-procedure vital signs reviewed and stable Respiratory status: spontaneous breathing, nonlabored ventilation and respiratory function stable Cardiovascular status: blood pressure returned to baseline and stable Postop Assessment: no signs of nausea or vomiting Anesthetic complications: no     Last Vitals:  Vitals:   10/17/17 1126 10/17/17 1152  BP: (!) 146/86 (!) 153/78  Pulse: 64 (!) 59  Resp: 17   Temp: 36.6 C   SpO2: 95% 100%    Last Pain:  Vitals:   10/17/17 1126  TempSrc:   PainSc: 0-No pain                 Trenton Verne

## 2017-10-17 NOTE — [facility] Group Notes (Signed)

## 2017-10-17 NOTE — Transfer of Care (Signed)
Immediate Anesthesia Transfer of Care Note  Patient: Angela [name]  Procedure(s) Performed: ECT TX  Patient Location: PACU  Anesthesia Type:General  Level of Consciousness: sedated  Airway & Oxygen Therapy: Patient Spontanous Breathing and Patient connected to face mask oxygen  Post-op Assessment: Report given to RN and Post -op Vital signs reviewed and stable  Post vital signs: Reviewed and stable  Last Vitals:  Vitals Value Taken Time  BP 156/91 10/17/2017 10:55 AM  Temp 36.8 C 10/17/2017 10:55 AM  Pulse 66 10/17/2017 10:57 AM  Resp 14 10/17/2017 10:57 AM  SpO2 100 % 10/17/2017 10:57 AM  Vitals shown include unvalidated device data.  Last Pain:  Vitals:   10/17/17 1055  TempSrc:   PainSc: Asleep      Patients Stated Pain Goal: 4 (92/49/32 4199)  Complications: No apparent anesthesia complications

## 2017-10-17 NOTE — Plan of Care (Signed)
Patient has the ability to make informed decisions regarding her care and has been present in the milieu for meals after ECT and interacting well with other members on the unit. Patient has the ability to identify the available resources that can assist in meeting her health-care needs. Patient was compliant with her medications this afternoon, however, she refused her evening medications because she is upset with the doctor. Patient denies SI/HI/AVH at this time. Patient remains safe on the unit at this time.  Problem: Education: Goal: Ability to make informed decisions regarding treatment will improve Outcome: Progressing   Problem: Coping: Goal: Coping ability will improve Outcome: Progressing   Problem: Health Behavior/Discharge Planning: Goal: Identification of resources available to assist in meeting health care needs will improve Outcome: Progressing   Problem: Medication: Goal: Compliance with prescribed medication regimen will improve Outcome: Progressing   Problem: Self-Concept: Goal: Ability to disclose and discuss suicidal ideas will improve Outcome: Progressing Goal: Will verbalize positive feelings about self Outcome: Progressing   Problem: Safety: Goal: Ability to remain free from injury will improve Outcome: Progressing

## 2017-10-17 NOTE — Anesthesia Procedure Notes (Signed)
Date/Time: 10/17/2017 10:42 AM Performed by: Dionne Bucy, CRNA Pre-anesthesia Checklist: Patient identified, Emergency Drugs available, Suction available and Patient being monitored Patient Re-evaluated:Patient Re-evaluated prior to induction Oxygen Delivery Method: Circle system utilized Preoxygenation: Pre-oxygenation with 100% oxygen Induction Type: IV induction Ventilation: Mask ventilation without difficulty and Mask ventilation throughout procedure Airway Equipment and Method: Bite block Placement Confirmation: positive ETCO2 Dental Injury: Teeth and Oropharynx as per pre-operative assessment

## 2017-10-17 NOTE — Progress Notes (Signed)
Patient states that she needs her Restasis at bedtime, not dinner time. Patient also stated to this writer that "I'm not going to take anymore medication until he gets it right, I'm just flabbergasted that he is ignoring his client like this".

## 2017-10-17 NOTE — Progress Notes (Addendum)
Patient this morning during vital signs assessments and blood sugar testings pre-ect refuses to be complaint. Patient has been asked this morning several times to comply with pre-ect blood sugar testings and vital signs, but refuses and is selectively mute. Will notify treatment team during hand-off report.   Update:  Patient after additional attempts to gain compliance by staff, patient was able to become complaint and allow staff to obtain vital signs and CBG.

## 2017-10-17 NOTE — Progress Notes (Signed)
D- Patient alert and oriented. Patient presents in a pleasant mood on assessment stating that . Patient denies SI, HI, AVH, and pain at this time. Patient also denies depression and anxiety. Patient had no stated goals at this time.  A- Support and encouragement provided.  Routine safety checks conducted every 15 minutes.  Patient informed to notify staff with problems or concerns.  R- No adverse drug reactions noted. Patient contracts for safety at this time. Patient compliant with medications and treatment plan. Patient receptive, calm, and cooperative. Patient interacts well with others on the unit.  Patient remains safe at this time.

## 2017-10-17 NOTE — Anesthesia Preprocedure Evaluation (Signed)
Anesthesia Evaluation  Patient identified by MRN, date of birth, ID band Patient awake    Reviewed: Allergy & Precautions, NPO status , Patient's Chart, lab work & pertinent test results  History of Anesthesia Complications Negative for: history of anesthetic complications  Airway Mallampati: II  TM Distance: >3 FB Neck ROM: Full    Dental  (+) Poor Dentition   Pulmonary neg sleep apnea, neg COPD, former smoker,    breath sounds clear to auscultation- rhonchi (-) wheezing      Cardiovascular hypertension, (-) CAD, (-) Past MI, (-) Cardiac Stents and (-) CABG  Rhythm:Regular Rate:Normal - Systolic murmurs and - Diastolic murmurs    Neuro/Psych  Headaches, PSYCHIATRIC DISORDERS Anxiety Depression Bipolar Disorder    GI/Hepatic Neg liver ROS, hiatal hernia, GERD  ,  Endo/Other  neg diabetesHypothyroidism   Renal/GU negative Renal ROS     Musculoskeletal negative musculoskeletal ROS (+)   Abdominal (+) - obese,   Peds  Hematology  (+) anemia ,   Anesthesia Other Findings Past Medical History: No date: Allergic rhinitis No date: Anemia No date: Blurred vision No date: Depression No date: Diverticulosis No date: Endometriosis No date: Falls No date: GERD (gastroesophageal reflux disease) No date: Hernia, hiatal No date: Hypothyroidism No date: IBS (irritable bowel syndrome) No date: Malignant neoplasm of skin No date: Migraine No date: Neuropathy No date: PTSD (post-traumatic stress disorder) No date: Thyroid disease   Reproductive/Obstetrics                             Anesthesia Physical Anesthesia Plan  ASA: II  Anesthesia Plan: General   Post-op Pain Management:    Induction: Intravenous  PONV Risk Score and Plan: 2 and Promethazine  Airway Management Planned: Mask  Additional Equipment:   Intra-op Plan:   Post-operative Plan:   Informed Consent: I have reviewed  the patients History and Physical, chart, labs and discussed the procedure including the risks, benefits and alternatives for the proposed anesthesia with the patient or authorized representative who has indicated his/her understanding and acceptance.   Dental advisory given  Plan Discussed with: CRNA and Anesthesiologist  Anesthesia Plan Comments:         Anesthesia Quick Evaluation

## 2017-10-17 NOTE — Progress Notes (Signed)
Recreation Therapy Notes  Date: 10/17/17  Time: 9:30 am   Location: Craft room   Behavioral response: N/A   Intervention Topic: Self-care   Discussion/Intervention: Patient did not attend group.   Clinical Observations/Feedback:  Patient did not attend group.        Waylon Koffler 10/17/2017 9:53 AM

## 2017-10-18 MED ORDER — QUETIAPINE FUMARATE 100 MG PO TABS
100.0000 mg | ORAL_TABLET | Freq: Every day | ORAL | Status: DC
  Administered 2017-10-18: 100 mg via ORAL
  Filled 2017-10-18: qty 1

## 2017-10-18 MED ORDER — CYCLOSPORINE 0.05 % OP EMUL
1.0000 [drp] | Freq: Two times a day (BID) | OPHTHALMIC | Status: DC
  Administered 2017-10-18 – 2017-11-06 (×37): 1 [drp] via OPHTHALMIC
  Filled 2017-10-18 (×39): qty 1

## 2017-10-18 NOTE — Progress Notes (Signed)
Patient alert and oriented x 4, affect is blunted, mood is angry and irritable, expressed frustration at the MD, at the staff, she stated " nobody listens to me" Patient became very hostile, tearful and verbally aggressive even had an altercation with another patient  on the unit. Patient was very agitated and was noted going back and forth on the unit  pacing often redirected but was not receptive, she accused Probation officer and staff of allowing her to be bullied, patient reassured that she is safe and such behaviors are not tolerated. Patient was often offered emotional and therapeutic support and nurse supervisor eventually was called per her request. Patient later settled down and took night time medication. Patient currently denies SI/HI/AVH, will continue to monitor.

## 2017-10-18 NOTE — [facility] Group Notes (Signed)
LCSW Group Therapy Note  10/18/2017 1:50pm  Type of Therapy/Topic:  Group Therapy:  Balance in Life  Participation Level:  Minimal  Description of Group:    This group will address the concept of balance and how it feels and looks when one is unbalanced. Patients will be encouraged to process areas in their lives that are out of balance and identify reasons for remaining unbalanced. Facilitators will guide patients in utilizing problem-solving interventions to address and correct the stressor making their life unbalanced. Understanding and applying boundaries will be explored and addressed for obtaining and maintaining a balanced life. Patients will be encouraged to explore ways to assertively make their unbalanced needs known to significant others in their lives, using other group members and facilitator for support and feedback.  Therapeutic Goals: 1. Patient will identify two or more emotions or situations they have that consume much of in their lives. 2. Patient will identify signs/triggers that life has become out of balance:  3. Patient will identify two ways to set boundaries in order to achieve balance in their lives:  4. Patient will demonstrate ability to communicate their needs through discussion and/or role plays  Summary of Patient Progress:  Patient arrived late and was disruptive and critical to presenter and LCSW dismissed her from group.     Therapeutic Modalities:   Cognitive Behavioral Therapy Solution-Focused Therapy Assertiveness Training  Joana Reamer, [place] 10/18/2017 2:50 PM

## 2017-10-18 NOTE — NC FL2 (Signed)
[place] LEVEL OF CARE SCREENING TOOL     IDENTIFICATION  Patient Name: Angela [name] Birthdate: [birth year] Sex: female Admission Date (Current Location): 09/19/2017  [place] and [place] Number:   [name]  [id number] [place] and Address:   [facility]  [place], [place]  [address]      Provider Number:  [id number]  Attending Physician Name and Address:  Gonzella Lex, MD  Relative Name and Phone Number:   Rickey Primus (friend)  [phone]    Current Level of Care:  Hospital/Inpatient Recommended Level of Care:  Level II/Group home Prior Approval Number:    Date Approved/Denied:   PASRR Number:    Discharge Plan:   Level II/[place]   Current Diagnoses: Patient Active Problem List   Diagnosis Date Noted  . Bipolar I disorder, most recent episode mixed, severe with psychotic features ([place]) 09/18/2017  . Agitation 09/18/2017  . IBS (irritable bowel syndrome) 11/17/2016  . Bilateral carpal tunnel syndrome 11/17/2016  . Plantar fasciitis, bilateral 11/17/2016  . Chronic pain syndrome 11/17/2016  . Chronic hyponatremia 11/17/2016  . PTSD (post-traumatic stress disorder) 11/17/2016  . Hernia, hiatal 11/17/2016  . Vitamin D deficiency 11/17/2016  . Vitamin B12 deficiency 11/17/2016  . SIADH (syndrome of inappropriate ADH production) ([place]) 09/08/2016  . Cervical spondylosis with myelopathy 05/24/2016  . Post-concussion headache 05/24/2016  . MCI (mild cognitive impairment) with memory loss 03/30/2016  . Gait abnormality 03/30/2016  . Chronic bipolar affective disorder ([place]) 01/24/2016  . Anemia, iron deficiency 06/24/2015  . Endometriosis 06/24/2015  . Essential hypertension 06/24/2015  . History of postmenopausal bleeding 05/22/2015  . History of TIA (transient ischemic attack) 04/20/2015  . Hypothyroidism 04/20/2015  . GERD (gastroesophageal reflux disease) 04/20/2015  . Cervical disc disorder  with radiculopathy 03/25/2015  . Chronic neck pain 03/25/2015  . Pelvic pain in female 10/30/2014  . History of skin cancer 06/30/2014  . Chronic headaches 12/16/2013  . Neuropathy 12/16/2013    Orientation RESPIRATION BLADDER Height & Weight    Self, Time, Situation, Place     Normal  Continent Weight: 117 lb (53.1 kg) Height:  5\' 3"  (160 cm)  BEHAVIORAL SYMPTOMS/MOOD NEUROLOGICAL BOWEL NUTRITION STATUS       Continent  Regular Diet  AMBULATORY STATUS COMMUNICATION OF NEEDS Skin    Independent  Verbally  Normal                       Personal Care Assistance Level of Assistance   Bathing, Feeding, Dressing  All Independent         Functional Limitations Info   Sight, Hearing, Speech  All Adequate        SPECIAL CARE FACTORS FREQUENCY                       Contractures  Not Present    Additional Factors Info                  Current Medications (10/18/2017):  This is the current hospital active medication list Current Facility-Administered Medications  Medication Dose Route Frequency Provider Last Rate Last Dose  . acetaminophen (TYLENOL) tablet 650 mg  650 mg Oral Q6H PRN Clapacs, Madie Reno, MD   650 mg at 10/18/17 0002  . albuterol (PROVENTIL HFA;VENTOLIN HFA) 108 (90 Base) MCG/ACT inhaler 2 puff  2 puff Inhalation Q6H PRN Pucilowska, Jolanta B, MD      .  alum & mag hydroxide-simeth (MAALOX/MYLANTA) 200-200-20 MG/5ML suspension 30 mL  30 mL Oral Q4H PRN Clapacs, John T, MD   30 mL at 10/08/17 1507  . amLODipine (NORVASC) tablet 2.5 mg  2.5 mg Oral BID Clapacs, Madie Reno, MD   2.5 mg at 10/17/17 0813  . calcium carbonate (TUMS - dosed in mg elemental calcium) chewable tablet 400 mg of elemental calcium  2 tablet Oral Q breakfast Pucilowska, Jolanta B, MD      . cholecalciferol (VITAMIN D) tablet 1,000 Units  1,000 Units Oral Daily Pucilowska, Jolanta B, MD   1,000 Units at 10/17/17 1201  . cycloSPORINE (RESTASIS) 0.05 % ophthalmic emulsion 1 drop  1 drop  Both Eyes BID Clapacs, Madie Reno, MD   1 drop at 10/17/17 0813  . diclofenac sodium (VOLTAREN) 1 % transdermal gel 4 g  4 g Topical QID Pucilowska, Jolanta B, MD   4 g at 10/17/17 2100  . feeding supplement (BOOST / RESOURCE BREEZE) liquid 1 Container  1 Container Oral TID BM Clapacs, Madie Reno, MD   1 Container at 10/17/17 2000  . fentaNYL (SUBLIMAZE) injection 25 mcg  25 mcg Intravenous Q5 min PRN Gunnar Bulla, MD      . fentaNYL (SUBLIMAZE) injection 25 mcg  25 mcg Intravenous Q5 min PRN Alvin Critchley, MD      . ferrous sulfate tablet 325 mg  325 mg Oral Q breakfast Pucilowska, Jolanta B, MD   325 mg at 10/17/17 1201  . gabapentin (NEURONTIN) capsule 100 mg  100 mg Oral BID Clapacs, Madie Reno, MD   100 mg at 10/17/17 1201  . glycopyrrolate (ROBINUL) injection 0.1 mg  0.1 mg Intravenous Once Clapacs, John T, MD      . hydrocortisone cream 1 %   Topical BID Clapacs, Madie Reno, MD   1 application at 54/09/81 1722  . levothyroxine (SYNTHROID, LEVOTHROID) tablet 100 mcg  100 mcg Oral QAC breakfast Pucilowska, Jolanta B, MD   100 mcg at 10/18/17 0654  . lidocaine (LIDODERM) 5 % 1 patch  1 patch Transdermal Q24H Pucilowska, Jolanta B, MD   1 patch at 10/16/17 1228  . linaclotide (LINZESS) capsule 290 mcg  290 mcg Oral QAC breakfast Pucilowska, Jolanta B, MD   290 mcg at 10/18/17 0654  . lip balm (BLISTEX) ointment   Topical PRN Clapacs, John T, MD      . loperamide (IMODIUM) capsule 2 mg  2 mg Oral PRN Pucilowska, Jolanta B, MD   2 mg at 10/18/17 0002  . LORazepam (ATIVAN) tablet 0.5 mg  0.5 mg Oral Q6H PRN Clapacs, Madie Reno, MD   0.5 mg at 10/17/17 2206  . magnesium hydroxide (MILK OF MAGNESIA) suspension 30 mL  30 mL Oral Daily PRN Clapacs, Madie Reno, MD   30 mL at 10/05/17 1731  . magnesium oxide (MAG-OX) tablet 200 mg  200 mg Oral BID Pucilowska, Jolanta B, MD   200 mg at 10/17/17 1204  . midazolam (VERSED) injection 2 mg  2 mg Intravenous Once Clapacs, John T, MD      . multivitamin with minerals tablet 1 tablet   1 tablet Oral Daily Pucilowska, Jolanta B, MD   1 tablet at 10/17/17 1200  . omega-3 acid ethyl esters (LOVAZA) capsule 1 g  1 g Oral BID Pucilowska, Jolanta B, MD   1 g at 10/17/17 1205  . ondansetron (ZOFRAN) injection 4 mg  4 mg Intravenous Once Clapacs, Madie Reno, MD      .  ondansetron (ZOFRAN) injection 4 mg  4 mg Intravenous Once PRN Gunnar Fusi, MD      . ondansetron [facility]) injection 4 mg  4 mg Intravenous Once PRN Gunnar Bulla, MD      . ondansetron [facility]) injection 4 mg  4 mg Intravenous Once PRN Alvin Critchley, MD      . ondansetron [facility]) injection 4 mg  4 mg Intravenous Once Clapacs, John T, MD      . ondansetron (ZOFRAN-ODT) disintegrating tablet 4 mg  4 mg Oral Q8H PRN Pucilowska, Jolanta B, MD   4 mg at 10/08/17 1807  . PARoxetine (PAXIL-CR) 24 hr tablet 25 mg  25 mg Oral Daily Clapacs, Madie Reno, MD   25 mg at 10/17/17 1204  . polyethylene glycol (MIRALAX / GLYCOLAX) packet 17 g  17 g Oral Daily Pucilowska, Jolanta B, MD      . polyvinyl alcohol (LIQUIFILM TEARS) 1.4 % ophthalmic solution 1 drop  1 drop Both Eyes PRN Ramond Dial, MD   1 drop at 10/15/17 2048  . prazosin (MINIPRESS) capsule 2 mg  2 mg Oral QHS Clapacs, Madie Reno, MD   2 mg at 10/17/17 2159  . promethazine (PHENERGAN) tablet 12.5 mg  12.5 mg Oral Q6H PRN Clapacs, John T, MD   12.5 mg at 10/08/17 1647  . pyridOXINE (VITAMIN B-6) tablet 100 mg  100 mg Oral BID Clapacs, Madie Reno, MD   100 mg at 10/17/17 1205  . tiZANidine (ZANAFLEX) tablet 2 mg  2 mg Oral Q8H PRN Pucilowska, Jolanta B, MD   2 mg at 10/12/17 1740  . traZODone (DESYREL) tablet 150 mg  150 mg Oral QHS Chauncey Mann, MD   150 mg at 10/17/17 2159  . triamcinolone cream (KENALOG) 0.1 %   Topical TID PRN Ramond Dial, MD   1 application at 42/87/68 2106  . vitamin C (ASCORBIC ACID) tablet 1,000 mg  1,000 mg Oral Daily Pucilowska, Jolanta B, MD         Discharge Medications: Please see discharge summary for a list of discharge  medications.  Relevant Imaging Results:  Relevant Lab Results:   Additional Information  SS# [id number]  Devona Konig, LCSW

## 2017-10-18 NOTE — Progress Notes (Signed)
PT Cancellation Note  Patient Details Name: Angela [name] MRN: [id number] DOB: Jun 29, 1950   Cancelled Treatment:    Reason Eval/Treat Not Completed: Other (comment)   Chart reviewed and session attempted.  Pt asleep in bed and did not awaken to voice.  Discussed with nursing who stated she has been asleep all morning.  She reported no difficulty with mobility skills on unit. Will continue as appropriate.   Chesley Noon 10/18/2017, 11:56 AM

## 2017-10-18 NOTE — Progress Notes (Signed)
[facility] MD Progress Note  10/18/2017 6:14 PM Angela [name]  MRN:  [id number] Subjective: Follow-up for this patient with bipolar disorder personality disorder medical problems.  Patient did not have ECT today because it is Thursday.  Patient is lit up with irritability and complaints tonight.  She is angry because somebody told her that I had "cleared her to go to a group home".  I tried to explain to her that that just meant that I thought that she would be ready for discharge as soon as we found any facility but of course she does not want to hear it.  She was very angry tonight but fortunately was not throwing things or acting out physically. Principal Problem: Bipolar I disorder, most recent episode mixed, severe with psychotic features ([place]) Diagnosis:   Patient Active Problem List   Diagnosis Date Noted  . Bipolar I disorder, most recent episode mixed, severe with psychotic features ([place]) [F31.64] 09/18/2017  . Agitation [R45.1] 09/18/2017  . IBS (irritable bowel syndrome) [K58.9] 11/17/2016  . Bilateral carpal tunnel syndrome [G56.03] 11/17/2016  . Plantar fasciitis, bilateral [M72.2] 11/17/2016  . Chronic pain syndrome [G89.4] 11/17/2016  . Chronic hyponatremia [E87.1] 11/17/2016  . PTSD (post-traumatic stress disorder) [F43.10] 11/17/2016  . Hernia, hiatal [K44.9] 11/17/2016  . Vitamin D deficiency [E55.9] 11/17/2016  . Vitamin B12 deficiency [E53.8] 11/17/2016  . SIADH (syndrome of inappropriate ADH production) ([place]) [E22.2] 09/08/2016  . Cervical spondylosis with myelopathy [M47.12] 05/24/2016  . Post-concussion headache [G44.309] 05/24/2016  . MCI (mild cognitive impairment) with memory loss [G31.84] 03/30/2016  . Gait abnormality [R26.9] 03/30/2016  . Chronic bipolar affective disorder ([place]) [F31.9] 01/24/2016  . Anemia, iron deficiency [D50.9] 06/24/2015  . Endometriosis [N80.9] 06/24/2015  . Essential hypertension [I10] 06/24/2015  . History of postmenopausal bleeding  [Z87.42] 05/22/2015  . History of TIA (transient ischemic attack) [Z86.73] 04/20/2015  . Hypothyroidism [E03.9] 04/20/2015  . GERD (gastroesophageal reflux disease) [K21.9] 04/20/2015  . Cervical disc disorder with radiculopathy [M50.10] 03/25/2015  . Chronic neck pain [M54.2, G89.29] 03/25/2015  . Pelvic pain in female [R10.2] 10/30/2014  . History of skin cancer [Z85.828] 06/30/2014  . Chronic headaches [R51] 12/16/2013  . Neuropathy [G62.9] 12/16/2013   Total Time spent with patient: 30 minutes  Past Psychiatric History: As documented many times before chronic long-standing mood and behavior problems  Past Medical History:  Past Medical History:  Diagnosis Date  . Allergic rhinitis   . Anemia   . Blurred vision   . Depression   . Diverticulosis   . Endometriosis   . Falls   . GERD (gastroesophageal reflux disease)   . Hernia, hiatal   . Hypothyroidism   . IBS (irritable bowel syndrome)   . Malignant neoplasm of skin   . Migraine   . Neuropathy   . PTSD (post-traumatic stress disorder)   . Thyroid disease     Past Surgical History:  Procedure Laterality Date  . APPENDECTOMY    . CHOLECYSTECTOMY    . CHOLECYSTECTOMY, LAPAROSCOPIC    . ESOPHAGOGASTRODUODENOSCOPY (EGD) WITH PROPOFOL N/A 03/29/2015   Procedure: ESOPHAGOGASTRODUODENOSCOPY (EGD) WITH PROPOFOL;  Surgeon: Josefine Class, MD;  [facility] END[facility]COPY;  Service: Endoscopy;  Laterality: N/A;  . HERNIA REPAIR    . laproscopy    . TONSILLECTOMY    . uteral suspension     Family History:  Family History  Problem Relation Age of Onset  . Heart disease Father   . Cancer Mother  lung  . Urolithiasis Neg Hx   . Kidney disease Neg Hx   . Kidney cancer Neg Hx   . Prostate cancer Neg Hx    Family Psychiatric  History: See previous note Social History:  Social History   Substance and Sexual Activity  Alcohol Use No     Social History   Substance and Sexual Activity  Drug Use No    Social  History   Socioeconomic History  . Marital status: Single    Spouse name: Not on file  . Number of children: 0  . Years of education: 1.5 years of college  . Highest education level: Not on file  Occupational History  . Occupation: Retired  Scientific laboratory technician  . Financial resource strain: Not on file  . Food insecurity:    Worry: Not on file    Inability: Not on file  . Transportation needs:    Medical: Not on file    Non-medical: Not on file  Tobacco Use  . Smoking status: Former Smoker    Last attempt to quit: 11/17/1976    Years since quitting: 40.9  . Smokeless tobacco: Never Used  Substance and Sexual Activity  . Alcohol use: No  . Drug use: No  . Sexual activity: Not Currently  Lifestyle  . Physical activity:    Days per week: Not on file    Minutes per session: Not on file  . Stress: Not on file  Relationships  . Social connections:    Talks on phone: Not on file    Gets together: Not on file    Attends religious service: Not on file    Active member of club or organization: Not on file    Attends meetings of clubs or organizations: Not on file    Relationship status: Not on file  Other Topics Concern  . Not on file  Social History Narrative   Lives at home alone.   Right-handed.   No daily caffeine use.   Additional Social History:    Pain Medications: See PTA Prescriptions: See PTA Over the Counter: See PTA History of alcohol / drug use?: No history of alcohol / drug abuse Longest period of sobriety (when/how long): Reports of none Negative Consequences of Use: (n/a) Withdrawal Symptoms: (n/a)                    Sleep: Fair  Appetite:  Fair  Current Medications: Current Facility-Administered Medications  Medication Dose Route Frequency Provider Last Rate Last Dose  . acetaminophen (TYLENOL) tablet 650 mg  650 mg Oral Q6H PRN Clapacs, Madie Reno, MD   650 mg at 10/18/17 1448  . albuterol (PROVENTIL HFA;VENTOLIN HFA) 108 (90 Base) MCG/ACT inhaler  2 puff  2 puff Inhalation Q6H PRN Pucilowska, Jolanta B, MD      . alum & mag hydroxide-simeth (MAALOX/MYLANTA) 200-200-20 MG/5ML suspension 30 mL  30 mL Oral Q4H PRN Clapacs, John T, MD   30 mL at 10/08/17 1507  . amLODipine (NORVASC) tablet 2.5 mg  2.5 mg Oral BID Clapacs, John T, MD   2.5 mg at 10/18/17 1440  . calcium carbonate (TUMS - dosed in mg elemental calcium) chewable tablet 400 mg of elemental calcium  2 tablet Oral Q breakfast Pucilowska, Jolanta B, MD   400 mg of elemental calcium at 10/18/17 1422  . cholecalciferol (VITAMIN D) tablet 1,000 Units  1,000 Units Oral Daily Pucilowska, Jolanta B, MD   1,000 Units at 10/18/17 1419  .  cycloSPORINE (RESTASIS) 0.05 % ophthalmic emulsion 1 drop  1 drop Both Eyes BID Clapacs, Madie Reno, MD   1 drop at 10/17/17 0813  . diclofenac sodium (VOLTAREN) 1 % transdermal gel 4 g  4 g Topical QID Pucilowska, Jolanta B, MD   4 g at 10/17/17 2100  . feeding supplement (BOOST / RESOURCE BREEZE) liquid 1 Container  1 Container Oral TID BM Clapacs, Madie Reno, MD   1 Container at 10/17/17 2000  . ferrous sulfate tablet 325 mg  325 mg Oral Q breakfast Pucilowska, Jolanta B, MD   325 mg at 10/18/17 1429  . gabapentin (NEURONTIN) capsule 100 mg  100 mg Oral BID Clapacs, Madie Reno, MD   100 mg at 10/18/17 1428  . glycopyrrolate (ROBINUL) injection 0.1 mg  0.1 mg Intravenous Once Clapacs, John T, MD      . hydrocortisone cream 1 %   Topical BID Clapacs, Madie Reno, MD   1 application at 78/29/56 1722  . levothyroxine (SYNTHROID, LEVOTHROID) tablet 100 mcg  100 mcg Oral QAC breakfast Pucilowska, Jolanta B, MD   100 mcg at 10/18/17 0654  . lidocaine (LIDODERM) 5 % 1 patch  1 patch Transdermal Q24H Pucilowska, Jolanta B, MD   1 patch at 10/18/17 1425  . linaclotide (LINZESS) capsule 290 mcg  290 mcg Oral QAC breakfast Pucilowska, Jolanta B, MD   290 mcg at 10/18/17 0654  . lip balm (BLISTEX) ointment   Topical PRN Clapacs, John T, MD      . loperamide (IMODIUM) capsule 2 mg  2 mg  Oral PRN Pucilowska, Jolanta B, MD   2 mg at 10/18/17 0002  . LORazepam (ATIVAN) tablet 0.5 mg  0.5 mg Oral Q6H PRN Clapacs, Madie Reno, MD   0.5 mg at 10/17/17 2206  . magnesium hydroxide (MILK OF MAGNESIA) suspension 30 mL  30 mL Oral Daily PRN Clapacs, Madie Reno, MD   30 mL at 10/05/17 1731  . magnesium oxide (MAG-OX) tablet 200 mg  200 mg Oral BID Pucilowska, Jolanta B, MD   200 mg at 10/18/17 1422  . midazolam (VERSED) injection 2 mg  2 mg Intravenous Once Clapacs, John T, MD      . multivitamin with minerals tablet 1 tablet  1 tablet Oral Daily Pucilowska, Jolanta B, MD   1 tablet at 10/18/17 1419  . omega-3 acid ethyl esters (LOVAZA) capsule 1 g  1 g Oral BID Pucilowska, Jolanta B, MD   1 g at 10/18/17 1421  . ondansetron (ZOFRAN) injection 4 mg  4 mg Intravenous Once Clapacs, John T, MD      . ondansetron [facility]) injection 4 mg  4 mg Intravenous Once PRN Gunnar Fusi, MD      . ondansetron [facility]) injection 4 mg  4 mg Intravenous Once PRN Gunnar Bulla, MD      . ondansetron [facility]) injection 4 mg  4 mg Intravenous Once PRN Alvin Critchley, MD      . ondansetron [facility]) injection 4 mg  4 mg Intravenous Once Clapacs, John T, MD      . ondansetron (ZOFRAN-ODT) disintegrating tablet 4 mg  4 mg Oral Q8H PRN Pucilowska, Jolanta B, MD   4 mg at 10/08/17 1807  . PARoxetine (PAXIL-CR) 24 hr tablet 25 mg  25 mg Oral Daily Clapacs, John T, MD   25 mg at 10/18/17 1420  . polyethylene glycol (MIRALAX / GLYCOLAX) packet 17 g  17 g Oral Daily Pucilowska, Jolanta B, MD      .  polyvinyl alcohol (LIQUIFILM TEARS) 1.4 % ophthalmic solution 1 drop  1 drop Both Eyes PRN Ramond Dial, MD   1 drop at 10/18/17 1430  . prazosin (MINIPRESS) capsule 2 mg  2 mg Oral QHS Clapacs, Madie Reno, MD   2 mg at 10/17/17 2159  . promethazine (PHENERGAN) tablet 12.5 mg  12.5 mg Oral Q6H PRN Clapacs, John T, MD   12.5 mg at 10/08/17 1647  . pyridOXINE (VITAMIN B-6) tablet 100 mg  100 mg Oral BID Clapacs, Madie Reno, MD   100 mg  at 10/18/17 1421  . QUEtiapine (SEROQUEL) tablet 100 mg  100 mg Oral QHS Clapacs, John T, MD      . tiZANidine (ZANAFLEX) tablet 2 mg  2 mg Oral Q8H PRN Pucilowska, Jolanta B, MD   2 mg at 10/12/17 1740  . traZODone (DESYREL) tablet 150 mg  150 mg Oral QHS Chauncey Mann, MD   150 mg at 10/17/17 2159  . triamcinolone cream (KENALOG) 0.1 %   Topical TID PRN Ramond Dial, MD   1 application at 41/66/06 2106  . vitamin C (ASCORBIC ACID) tablet 1,000 mg  1,000 mg Oral Daily Pucilowska, Jolanta B, MD   1,000 mg at 10/18/17 1420    Lab Results:  Results for orders placed or performed during the hospital encounter of 09/19/17 (from the past 48 hour(s))  Glucose, capillary     Status: None   Collection Time: 10/17/17  6:31 AM  Result Value Ref Range   Glucose-Capillary 74 70 - 99 mg/dL   Comment 1 Notify RN   Glucose, capillary     Status: None   Collection Time: 10/17/17  9:32 AM  Result Value Ref Range   Glucose-Capillary 71 70 - 99 mg/dL    Blood Alcohol level:  Lab Results  Component Value Date   ETH <5 09/29/2016   ETH <5 30/16/0109    Metabolic Disorder Labs: Lab Results  Component Value Date   HGBA1C 5.7 (H) 09/20/2017   MPG 116.89 09/20/2017   No results found for: PROLACTIN Lab Results  Component Value Date   CHOL 173 09/20/2017   TRIG 193 (H) 09/20/2017   HDL 50 09/20/2017   CHOLHDL 3.5 09/20/2017   VLDL 39 09/20/2017   LDLCALC 84 09/20/2017   LDLCALC 87 09/04/2017    Physical Findings: AIMS:  , ,  ,  ,    CIWA:    COWS:     Musculoskeletal: Strength & Muscle Tone: within normal limits Gait & Station: normal Patient leans: N/A  Psychiatric Specialty Exam: Physical Exam  Nursing note and vitals reviewed. Constitutional: She appears well-developed and well-nourished.  HENT:  Head: Normocephalic and atraumatic.  Eyes: Pupils are equal, round, and reactive to light. Conjunctivae are normal.  Neck: Normal range of motion.  Cardiovascular: Regular  rhythm and normal heart sounds.  Respiratory: Effort normal. No respiratory distress.  GI: Soft.  Musculoskeletal: Normal range of motion.  Neurological: She is alert.  Skin: Skin is warm and dry.  Psychiatric: Her affect is labile and inappropriate. Her speech is tangential. She is agitated. Thought content is paranoid. Cognition and memory are impaired. She expresses impulsivity. She expresses no homicidal and no suicidal ideation.    Review of Systems  Constitutional: Negative.   HENT: Negative.   Eyes: Negative.   Respiratory: Negative.   Cardiovascular: Negative.   Gastrointestinal: Negative.   Musculoskeletal: Negative.   Skin: Negative.   Neurological: Negative.   Psychiatric/Behavioral: Positive for depression.  Negative for hallucinations, memory loss, substance abuse and suicidal ideas. The patient is nervous/anxious and has insomnia.     Blood pressure (!) 143/96, pulse 64, temperature 97.9 F (36.6 C), temperature source Oral, resp. rate 19, height 5\' 3"  (1.6 m), weight 53.1 kg, SpO2 95 %.Body mass index is 20.73 kg/m.  General Appearance: Disheveled  Eye Contact:  Fair  Speech:  Pressured  Volume:  Increased  Mood:  Angry and Irritable  Affect:  Inappropriate and Labile  Thought Process:  Disorganized  Orientation:  Full (Time, Place, and Person)  Thought Content:  Illogical, Paranoid Ideation, Rumination and Tangential  Suicidal Thoughts:  No  Homicidal Thoughts:  No  Memory:  Immediate;   Fair Recent;   Fair Remote;   Fair  Judgement:  Impaired  Insight:  Shallow  Psychomotor Activity:  Decreased  Concentration:  Concentration: Poor  Recall:  Poor  Fund of Knowledge:  Poor  Language:  Fair  Akathisia:  No  Handed:  Right  AIMS (if indicated):     Assets:  Desire for Improvement Physical Health  ADL's:  Intact  Cognition:  Impaired,  Mild  Sleep:  Number of Hours: 4.75     Treatment Plan Summary: Medication management and Plan Patient is just on  fire with irritability tonight but fortunately she has not regressed back to the point of actually doing anything dangerous or physically disruptive.  She was able to hear a little bit of the constructive feedback I gave her although not a lot.  Yesterday's ECT treatment unfortunately was not of adequate length and probably did not provide much benefit and we are not scheduled for ECT tomorrow.  As a result we will next be doing treatment on Monday.  At that time I am going to switch to ketamine.  In the meanwhile I am going to add a mood stabilizer because she really needs something to try and get her thinking together.  Started Seroquel 100 mg tonight and told her about it.  I will see if I can make any of the minor and trivial adjustments to some of her other medicines that she is asking for.  Case reviewed with nursing  Alethia Berthold, MD 10/18/2017, 6:14 PM

## 2017-10-18 NOTE — Progress Notes (Signed)
Patient is sound asleep in her room and it was reported to this writer that patient had a rough night, so this writer is allowing patient to sleep. MD paged to notify him, waiting for a call back.

## 2017-10-18 NOTE — Progress Notes (Signed)
Patient ID: Angela [name], female   DOB: February 07, 1950, 67 y.o.   MRN: [id number] CSW engaged in discharge planning by contacting several [facility]grouphomes in the [facility] area per pt's request.    These homes included: [place] 1 [phone]):  Spoke with ower, Jari Pigg.  CSW informed that he does not have any available female beds [place] [facility] [phone]): Spoke with owner Ival Bible.  Ms. [name] Heck shared that she has opened a new women's facility, but it is not currently licensed.  She shared that she is a month away from this facility being licensed.  Ms. [name] Heck shared that if the pt is okay with coming into the facility before it is licensed she would be happy to review the FL2 and H&P for consideration.  Ms. [name] Heck gave CSW the fax number to sent the documentation [phone]). CSW informed Ms. [name] Heck that she would speak with pt about this option and if pt gives consent then CSW would fax over the documentation. [facility] [phone]:  CSW informed that they do not have any open female beds available at this time [facility] [phone]):  No VM could be left at this number [facility] [phone]):  Spoke with owner, Derik Cira Servant.  CSW informed that he has a co-ed facility and that he did not have any current available beds at this time.  CSW informed by Mr. Satira Sark that he is opening a new facility, but he prefers private pay residents. [place]. [phone]):  CSW informed that this is a males only facility [place] [phone]):  CSW left a VM with request for callback [facility] [phone]):  CSW had to leave a VM with request for callback [facility] [phone]):  This was not a good number.  CSW received an error message. [facility] [phone]):  CSW could not leave a VM. [place]  [phone]):  CSW left a VM with request for callback.  CSW followed back up with Sherrell with [facility] [phone]). CSW Lurline Idol had spoken with her in early September 2019 at pt's request about apartment availability.  CSW was asked to follow back up in October 2019.  CSW was informed that the only apartment that is currently available is a 2-bedroom upstairs.  CSW was informed that pt must have $2,175 in gross monthly income or 3X the rent in order to complete an application.  CSW informed Sherrell that she would share this information with pt.  CSW began contacting low-income housing to inquire about 1Bedroom/1Bath apartments that are available.  CSW contacted the following: [facility] [place], [place];  [phone]):  They have a 1 year waitlist [address] [place], [place];  [phone]):  CSW left VM with request for callback [place], [place];  [phone]):  CSW left a VM with request for callback [facility] [place], [place];  [phone]):  CSW spoke with Abigail Butts who informed me that the leasing agent is out of the office for the next two weeks.  She informed CSW that she would have someone who is covering for her to contact CSW about apartment availability or how long the waitlist is.  Abigail Butts informed CSW that pt could come by the office and pick up an application. CSW asked Abigail Butts if application could be mailed to [place] or faxed.  Abigail Butts shared that she would get the answer to this question and follow back  up with CSW. [place], [place];  [phone]):  CSW left VM with request for callback Duanne Guess [place], [place];  [phone]):  CSW left VM with request for callback

## 2017-10-18 NOTE — Plan of Care (Signed)
Patient has the ability to make informed decisions regarding her care and has been present in the milieu throughout the afternoon after sleeping all morning.  Patient has the ability to identify the available resources that can assist in meeting her health-care needs. Patient was compliant with her medications this afternoon once she woke up. Patient denies SI/HI/AVH at this time. Patient remains safe on the unit at this time.  Problem: Education: Goal: Ability to make informed decisions regarding treatment will improve Outcome: Progressing   Problem: Coping: Goal: Coping ability will improve Outcome: Progressing   Problem: Health Behavior/Discharge Planning: Goal: Identification of resources available to assist in meeting health care needs will improve Outcome: Progressing   Problem: Medication: Goal: Compliance with prescribed medication regimen will improve Outcome: Progressing   Problem: Self-Concept: Goal: Ability to disclose and discuss suicidal ideas will improve Outcome: Progressing Goal: Will verbalize positive feelings about self Outcome: Progressing   Problem: Safety: Goal: Ability to remain free from injury will improve Outcome: Progressing   Problem: Spiritual Needs Goal: Ability to function at adequate level Outcome: Progressing
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
## 2017-10-18 NOTE — Progress Notes (Signed)
Patient came to this Probation officer with another list of requests stating that she needs to call the bank and check her accounts, she needs the number to [employer] on [address] and needs the number for [employer].

## 2017-10-18 NOTE — [facility] Group Notes (Signed)
[facility] Group Notes:  (Nursing/MHT/Case Management/Adjunct)  Date:  10/18/2017  Time:  1:30 AM  Type of Therapy:  Group Therapy  Participation Level:  Active  Participation Quality:  Sharing and wanting to talk about what was upseting her that happen between her and another patient Coralyn Mark and she got upset  Affect:  Anxious  Cognitive:  Alert  Insight:  None  Engagement in Group:  Defensive and Engaged  Modes of Intervention:  Activity  Summary of Progress/Problems:  Nehemiah Settle 10/18/2017, 1:30 AM

## 2017-10-18 NOTE — Progress Notes (Signed)
This Probation officer obtained the phone numbers for [employer], [employer], and [employer] for patient. Patient stated that she was told to call the [facility] on call because Patient Experience was closed. This Probation officer called the [facility] and she stated that she was tied up with staffing, so she informed me to instruct patient to call Patient Experience back and leave a message as well as ask to speak with our Director tomorrow because patient already spoke with the [facility] last night. This Probation officer will inform patient of this after she finishes speaking with MD.

## 2017-10-18 NOTE — Progress Notes (Signed)
Patient was extremely tearful and stated to this writer that "I don't want to deal with Dr. Weber Cooks anymore, he spoke to me like I was an nincompoop". Patient also states "I don't want to live, just give me an overdose". This Probation officer talked with patient and helped calm her down. Patient stated that she does not want Coralyn Mark, night shift nurse, to be her nurse anymore because "he talks to me like he's my daddy". This Probation officer will inform oncoming shift of this information.

## 2017-10-18 NOTE — Progress Notes (Signed)
Patient received her morning dose of Amlodipine at 1448, so her evening dose has been moved to 2000 per MD request.

## 2017-10-18 NOTE — Progress Notes (Signed)
Patient wants to take her evening medications at bedtime because she took them so late.

## 2017-10-18 NOTE — Progress Notes (Signed)
Recreation Therapy Notes  Date:10/18/17  Time:9:30 am  Location:Craft room  Behavioral response:N/A  Intervention Topic:Problem Solving  Discussion/Intervention: Patient did not attend group.  Clinical Observations/Feedback:  Patient did not attend group.          Burlene Montecalvo 10/18/2017 10:26 AM

## 2017-10-18 NOTE — Plan of Care (Signed)
  Problem: Education: Goal: Ability to make informed decisions regarding treatment will improve Outcome: Progressing   Problem: Coping: Goal: Coping ability will improve Outcome: Progressing  Patient able to make informed decisions about treatment  Patient is not coping effectively

## 2017-10-19 MED ORDER — LEVALBUTEROL HCL 0.63 MG/3ML IN NEBU
0.6300 mg | INHALATION_SOLUTION | Freq: Three times a day (TID) | RESPIRATORY_TRACT | Status: DC | PRN
  Administered 2017-10-20: 0.63 mg via RESPIRATORY_TRACT
  Filled 2017-10-19 (×2): qty 3

## 2017-10-19 MED ORDER — LEVALBUTEROL TARTRATE 45 MCG/ACT IN AERO
2.0000 | INHALATION_SPRAY | Freq: Three times a day (TID) | RESPIRATORY_TRACT | Status: DC | PRN
  Filled 2017-10-19: qty 15

## 2017-10-19 MED ORDER — QUETIAPINE FUMARATE 25 MG PO TABS
150.0000 mg | ORAL_TABLET | Freq: Every day | ORAL | Status: DC
  Administered 2017-10-19: 150 mg via ORAL
  Filled 2017-10-19: qty 1

## 2017-10-19 NOTE — [facility] Group Notes (Signed)
LCSW Group Therapy Note  10/19/2017 1:00pm  Type of Therapy and Topic:  Group Therapy:  Feelings around Relapse and Recovery  Participation Level:  Did Not Attend   Description of Group:    Patients in this group will discuss emotions they experience before and after a relapse. They will process how experiencing these feelings, or avoidance of experiencing them, relates to having a relapse. Facilitator will guide patients to explore emotions they have related to recovery. Patients will be encouraged to process which emotions are more powerful. They will be guided to discuss the emotional reaction significant others in their lives may have to their relapse or recovery. Patients will be assisted in exploring ways to respond to the emotions of others without this contributing to a relapse.  Therapeutic Goals: 1. Patient will identify two or more emotions that lead to a relapse for them 2. Patient will identify two emotions that result when they relapse 3. Patient will identify two emotions related to recovery 4. Patient will demonstrate ability to communicate their needs through discussion and/or role plays   Summary of Patient Progress:     Therapeutic Modalities:   Cognitive Behavioral Therapy Solution-Focused Therapy Assertiveness Training Relapse Prevention Therapy   Joana Reamer, LCSW 10/19/2017 2:01 PM

## 2017-10-19 NOTE — Tx Team (Signed)
Interdisciplinary Treatment and Diagnostic Plan Update  10/15/2017 Time of Session: 10:30am Angela [name] MRN: [id number]  Principal Diagnosis: Bipolar I disorder, most recent episode mixed, severe with psychotic features ([place])  Secondary Diagnoses: Principal Problem:   Bipolar I disorder, most recent episode mixed, severe with psychotic features ([place]) Active Problems:   Hypothyroidism   Essential hypertension   Plantar fasciitis, bilateral   PTSD (post-traumatic stress disorder)   Agitation   Current Medications:  Current Facility-Administered Medications  Medication Dose Route Frequency Provider Last Rate Last Dose  . acetaminophen (TYLENOL) tablet 650 mg  650 mg Oral Q6H PRN Clapacs, Madie Reno, MD   650 mg at 10/19/17 1155  . albuterol (PROVENTIL HFA;VENTOLIN HFA) 108 (90 Base) MCG/ACT inhaler 2 puff  2 puff Inhalation Q6H PRN Pucilowska, Jolanta B, MD   2 puff at 10/19/17 1146  . alum & mag hydroxide-simeth (MAALOX/MYLANTA) 200-200-20 MG/5ML suspension 30 mL  30 mL Oral Q4H PRN Clapacs, John T, MD   30 mL at 10/08/17 1507  . amLODipine (NORVASC) tablet 2.5 mg  2.5 mg Oral BID Clapacs, Madie Reno, MD   2.5 mg at 10/19/17 1143  . calcium carbonate (TUMS - dosed in mg elemental calcium) chewable tablet 400 mg of elemental calcium  2 tablet Oral Q breakfast Pucilowska, Jolanta B, MD   400 mg of elemental calcium at 10/19/17 1145  . cholecalciferol (VITAMIN D) tablet 1,000 Units  1,000 Units Oral Daily Pucilowska, Jolanta B, MD   1,000 Units at 10/19/17 1144  . cycloSPORINE (RESTASIS) 0.05 % ophthalmic emulsion 1 drop  1 drop Both Eyes BID Clapacs, Madie Reno, MD   1 drop at 10/19/17 1147  . diclofenac sodium (VOLTAREN) 1 % transdermal gel 4 g  4 g Topical QID Pucilowska, Jolanta B, MD   4 g at 10/19/17 1151  . feeding supplement (BOOST / RESOURCE BREEZE) liquid 1 Container  1 Container Oral TID BM Clapacs, Madie Reno, MD   1 Container at 10/18/17 2003  . ferrous sulfate tablet 325 mg  325 mg  Oral Q breakfast Pucilowska, Jolanta B, MD   325 mg at 10/19/17 1144  . gabapentin (NEURONTIN) capsule 100 mg  100 mg Oral BID Clapacs, John T, MD   100 mg at 10/19/17 1145  . glycopyrrolate (ROBINUL) injection 0.1 mg  0.1 mg Intravenous Once Clapacs, John T, MD      . hydrocortisone cream 1 %   Topical BID Clapacs, Madie Reno, MD   1 application at 40/98/11 1151  . levothyroxine (SYNTHROID, LEVOTHROID) tablet 100 mcg  100 mcg Oral QAC breakfast Pucilowska, Jolanta B, MD   100 mcg at 10/19/17 1143  . lidocaine (LIDODERM) 5 % 1 patch  1 patch Transdermal Q24H Pucilowska, Jolanta B, MD   1 patch at 10/18/17 1425  . linaclotide (LINZESS) capsule 290 mcg  290 mcg Oral QAC breakfast Pucilowska, Jolanta B, MD   290 mcg at 10/19/17 1148  . lip balm (BLISTEX) ointment   Topical PRN Clapacs, John T, MD      . loperamide (IMODIUM) capsule 2 mg  2 mg Oral PRN Pucilowska, Jolanta B, MD   2 mg at 10/18/17 0002  . LORazepam (ATIVAN) tablet 0.5 mg  0.5 mg Oral Q6H PRN Clapacs, Madie Reno, MD   0.5 mg at 10/17/17 2206  . magnesium hydroxide (MILK OF MAGNESIA) suspension 30 mL  30 mL Oral Daily PRN Clapacs, Madie Reno, MD   30 mL at 10/05/17 1731  . magnesium oxide (  MAG-OX) tablet 200 mg  200 mg Oral BID Pucilowska, Jolanta B, MD   200 mg at 10/19/17 1148  . midazolam (VERSED) injection 2 mg  2 mg Intravenous Once Clapacs, John T, MD      . multivitamin with minerals tablet 1 tablet  1 tablet Oral Daily Pucilowska, Jolanta B, MD   1 tablet at 10/19/17 1144  . omega-3 acid ethyl esters (LOVAZA) capsule 1 g  1 g Oral BID Pucilowska, Jolanta B, MD   1 g at 10/18/17 1959  . ondansetron (ZOFRAN) injection 4 mg  4 mg Intravenous Once Clapacs, John T, MD      . ondansetron [facility]) injection 4 mg  4 mg Intravenous Once PRN Gunnar Fusi, MD      . ondansetron [facility]) injection 4 mg  4 mg Intravenous Once PRN Gunnar Bulla, MD      . ondansetron [facility]) injection 4 mg  4 mg Intravenous Once PRN Alvin Critchley, MD      .  ondansetron [facility]) injection 4 mg  4 mg Intravenous Once Clapacs, John T, MD      . ondansetron (ZOFRAN-ODT) disintegrating tablet 4 mg  4 mg Oral Q8H PRN Pucilowska, Jolanta B, MD   4 mg at 10/08/17 1807  . PARoxetine (PAXIL-CR) 24 hr tablet 25 mg  25 mg Oral Daily Clapacs, John T, MD   25 mg at 10/19/17 1147  . polyethylene glycol (MIRALAX / GLYCOLAX) packet 17 g  17 g Oral Daily Pucilowska, Jolanta B, MD      . polyvinyl alcohol (LIQUIFILM TEARS) 1.4 % ophthalmic solution 1 drop  1 drop Both Eyes PRN Ramond Dial, MD   1 drop at 10/18/17 1430  . prazosin (MINIPRESS) capsule 2 mg  2 mg Oral QHS Clapacs, Madie Reno, MD   2 mg at 10/18/17 2338  . promethazine (PHENERGAN) tablet 12.5 mg  12.5 mg Oral Q6H PRN Clapacs, John T, MD   12.5 mg at 10/08/17 1647  . pyridOXINE (VITAMIN B-6) tablet 100 mg  100 mg Oral BID Clapacs, John T, MD   100 mg at 10/19/17 1146  . QUEtiapine (SEROQUEL) tablet 100 mg  100 mg Oral QHS Clapacs, John T, MD   100 mg at 10/18/17 2307  . tiZANidine (ZANAFLEX) tablet 2 mg  2 mg Oral Q8H PRN Pucilowska, Jolanta B, MD   2 mg at 10/12/17 1740  . traZODone (DESYREL) tablet 150 mg  150 mg Oral QHS Chauncey Mann, MD   150 mg at 10/18/17 2307  . triamcinolone cream (KENALOG) 0.1 %   Topical TID PRN Ramond Dial, MD   1 application at 60/10/93 2106  . vitamin C (ASCORBIC ACID) tablet 1,000 mg  1,000 mg Oral Daily Pucilowska, Jolanta B, MD   1,000 mg at 10/19/17 1148   PTA Medications: Medications Prior to Admission  Medication Sig Dispense Refill Last Dose  . amLODipine (NORVASC) 5 MG tablet Take 1 tablet (5 mg total) by mouth 2 (two) times daily. 180 tablet 1 10/09/2017 at Unknown time  . ANORO ELLIPTA 62.5-25 MCG/INH AEPB Inhale 1 puff into the lungs daily. 14 each 0 10/09/2017 at Unknown time  . calcium carbonate (OS-CAL) 600 MG TABS tablet Take 1 tablet (600 mg total) by mouth 2 (two) times daily with a meal. (Patient taking differently: Take 600 mg 3 (three) times daily  by mouth. ) 60 tablet 0 10/09/2017 at Unknown time  . cholecalciferol (VITAMIN D) 1000 units tablet Take 4,000 Units by  mouth daily.   10/09/2017 at Unknown time  . citalopram (CELEXA) 20 MG tablet Take 20 mg by mouth daily.  0 10/09/2017 at Unknown time  . conjugated estrogens (PREMARIN) vaginal cream Place vaginally 3 (three) times a week. Use 1/2 gram 3-4 times weekly. 42.5 g 4 10/09/2017 at Unknown time  . cycloSPORINE (RESTASIS) 0.05 % ophthalmic emulsion Place 1 drop into both eyes 2 (two) times daily. 60 each 5 10/09/2017 at Unknown time  . diclofenac sodium (VOLTAREN) 1 % GEL Apply 2 g topically 4 (four) times daily.   10/09/2017 at Unknown time  . ferrous sulfate (FERROUSUL) 325 (65 FE) MG tablet Take 1 tablet (325 mg total) by mouth daily with breakfast. 90 tablet 1 10/09/2017 at Unknown time  . gabapentin (NEURONTIN) 100 MG capsule Take 1-2 capsules (100-200 mg total) by mouth 3 (three) times daily. (Patient taking differently: Take 100 mg by mouth 2 (two) times daily. ) 540 capsule 1 10/09/2017 at Unknown time  . guaiFENesin (MUCINEX) 600 MG 12 hr tablet Take 1 tablet (600 mg total) by mouth 2 (two) times daily as needed for cough. (Patient taking differently: Take 600 mg by mouth daily. ) 30 tablet 0 10/09/2017 at Unknown time  . hydrocortisone (PROCTOZONE-HC) 2.5 % rectal cream Place 1 application rectally 2 (two) times daily. 30 g 1 10/09/2017 at Unknown time  . hydrOXYzine (ATARAX/VISTARIL) 10 MG tablet Take 1 tablet by mouth 2 (two) times daily as needed for anxiety.    10/09/2017 at Unknown time  . KRILL OIL OMEGA-3 PO Take 350 mg by mouth daily.   10/09/2017 at Unknown time  . levalbuterol (XOPENEX) 1.25 MG/3ML nebulizer solution Inhale 3 mLs into the lungs daily.   10/09/2017 at Unknown time  . levothyroxine (SYNTHROID, LEVOTHROID) 100 MCG tablet Take 1 tablet (100 mcg total) by mouth daily before breakfast. 90 tablet 1 10/09/2017 at Unknown time  . linaclotide (LINZESS) 290 MCG CAPS capsule Take  1 capsule (290 mcg total) by mouth daily before breakfast. 90 capsule 1 10/09/2017 at Unknown time  . magnesium oxide (MAG-OX) 400 MG tablet Take 400 mg by mouth daily with breakfast.   10/09/2017 at Unknown time  . montelukast (SINGULAIR) 10 MG tablet Take 1 tablet by mouth at bedtime.  11 10/09/2017 at Unknown time  . omeprazole (PRILOSEC) 20 MG capsule Take 1 capsule by mouth daily.   10/09/2017 at Unknown time  . Polyethyl Glycol-Propyl Glycol (SYSTANE OP) Place 1 drop into both eyes daily as needed (dry eyes).   10/09/2017 at Unknown time  . polyethylene glycol powder (GLYCOLAX/MIRALAX) powder Take 17-34 g by mouth daily. 1700 g 5 10/09/2017 at Unknown time  . Probiotic Product (PROBIOTIC PO) Take 1 tablet by mouth daily.   10/09/2017 at Unknown time  . promethazine (PHENERGAN) 25 MG tablet Take 0.5 tablets (12.5 mg total) by mouth every 6 (six) hours as needed for nausea or vomiting. 30 tablet 0 10/09/2017 at Unknown time  . Pyridoxine HCl (VITAMIN B-6) 500 MG tablet Take 1 tablet by mouth 2 (two) times daily.   10/09/2017 at Unknown time  . ranitidine (ZANTAC) 150 MG tablet Take 150 mg by mouth daily before supper.   10/09/2017 at Unknown time  . tiZANidine (ZANAFLEX) 2 MG tablet Take 0.5-1 tablets (1-2 mg total) by mouth 2 (two) times daily as needed for muscle spasms. 180 tablet 1 10/09/2017 at Unknown time  . tolterodine (DETROL LA) 2 MG 24 hr capsule Take 1 capsule (2 mg total) daily  by mouth. 30 capsule 11 10/09/2017 at Unknown time  . traZODone (DESYREL) 50 MG tablet Take 50 mg by mouth at bedtime.   10/09/2017 at Unknown time    Patient Stressors: Financial difficulties Health problems Traumatic event Other: "Pain" "fear" "trauma" "homeless"  Patient Strengths: Ability for insight Communication skills General fund of knowledge Motivation for treatment/growth  Treatment Modalities: Medication Management, Group therapy, Case management,  1 to 1 session with clinician, Psychoeducation,  Recreational therapy.   Physician Treatment Plan for Primary Diagnosis: Bipolar I disorder, most recent episode mixed, severe with psychotic features ([place]) Long Term Goal(s): Improvement in symptoms so as ready for discharge Improvement in symptoms so as ready for discharge   Short Term Goals: Ability to identify changes in lifestyle to reduce recurrence of condition will improve Ability to verbalize feelings will improve Ability to disclose and discuss suicidal ideas Ability to demonstrate self-control will improve Ability to identify and develop effective coping behaviors will improve Ability to maintain clinical measurements within normal limits will improve Compliance with prescribed medications will improve Ability to identify triggers associated with substance abuse/mental health issues will improve NA  Medication Management: Evaluate patient's response, side effects, and tolerance of medication regimen.  Therapeutic Interventions: 1 to 1 sessions, Unit Group sessions and Medication administration.  Evaluation of Outcomes: Progressing  Physician Treatment Plan for Secondary Diagnosis: Principal Problem:   Bipolar I disorder, most recent episode mixed, severe with psychotic features ([place]) Active Problems:   Hypothyroidism   Essential hypertension   Plantar fasciitis, bilateral   PTSD (post-traumatic stress disorder)   Agitation  Long Term Goal(s): Improvement in symptoms so as ready for discharge Improvement in symptoms so as ready for discharge   Short Term Goals: Ability to identify changes in lifestyle to reduce recurrence of condition will improve Ability to verbalize feelings will improve Ability to disclose and discuss suicidal ideas Ability to demonstrate self-control will improve Ability to identify and develop effective coping behaviors will improve Ability to maintain clinical measurements within normal limits will improve Compliance with prescribed medications  will improve Ability to identify triggers associated with substance abuse/mental health issues will improve NA     Medication Management: Evaluate patient's response, side effects, and tolerance of medication regimen.  Therapeutic Interventions: 1 to 1 sessions, Unit Group sessions and Medication administration.  Evaluation of Outcomes: Progressing   RN Treatment Plan for Primary Diagnosis: Bipolar I disorder, most recent episode mixed, severe with psychotic features ([place]) Long Term Goal(s): Knowledge of disease and therapeutic regimen to maintain health will improve  Short Term Goals: Ability to identify and develop effective coping behaviors will improve and Compliance with prescribed medications will improve  Medication Management: RN will administer medications as ordered by provider, will assess and evaluate patient's response and provide education to patient for prescribed medication. RN will report any adverse and/or side effects to prescribing provider.  Therapeutic Interventions: 1 on 1 counseling sessions, Psychoeducation, Medication administration, Evaluate responses to treatment, Monitor vital signs and CBGs as ordered, Perform/monitor CIWA, COWS, AIMS and Fall Risk screenings as ordered, Perform wound care treatments as ordered.  Evaluation of Outcomes: Progressing   LCSW Treatment Plan for Primary Diagnosis: Bipolar I disorder, most recent episode mixed, severe with psychotic features ([place]) Long Term Goal(s): Safe transition to appropriate next level of care at discharge, Engage patient in therapeutic group addressing interpersonal concerns.  Short Term Goals: Engage patient in aftercare planning with referrals and resources, Increase social support and Increase skills for wellness and  recovery  Therapeutic Interventions: Assess for all discharge needs, 1 to 1 time with Education officer, museum, Explore available resources and support systems, Assess for adequacy in community support  network, Educate family and significant other(s) on suicide prevention, Complete Psychosocial Assessment, Interpersonal group therapy.  Evaluation of Outcomes: Progressing   Progress in Treatment: Attending groups: Yes. Participating in groups: Yes. Taking medication as prescribed: Yes. Toleration medication: Yes. Family/Significant other contact made: No, will contact:  pt declined consent Patient understands diagnosis: Yes. Discussing patient identified problems/goals with staff: Yes. Medical problems stabilized or resolved: Yes. Denies suicidal/homicidal ideation: Yes. Issues/concerns per patient self-inventory: No. Other: none  New problem(s) identified: No, Describe:  none  New Short Term/Long Term Goal(s):  Patient Goals: Currently receiving ECT treatment  Discharge Plan or Barriers: TBD.  Pt is making some progress with ECT, but still quite irritable and demanding. Barrier to discharge has been finding pt an appropriate place to discharge to.  CSW has contacted several mental health facilities/grouphomes, but there have not been any open beds.  Several low-income apartments have also been contacted with long waitlists.  Other placement/residential options will be explored.  Reason for Continuation of Hospitalization: Depression, irritability, disorganization, refusing medications at times. Pt needs to continue ECT.  On-going efforts to secure appropriate d/c placement.  Estimated Length of Stay: 5-7 days.  Attendees: Patient:   Physician: Alethia Berthold, MD  10/19/2017  Nursing:   RN Care Manager:   Social Worker:Hannan Hutmacher Eulas Post, LCSW 10/19/2017  Recreational Therapist: Roanna Epley, LRT 10/19/2017  Other: Darin Engels, [place] 10/19/2017  Other:    Other:     Scribe for Treatment Team: Devona Konig, LCSW 10/19/2017 1:08 PM

## 2017-10-19 NOTE — Progress Notes (Signed)
[facility] MD Progress Note  10/19/2017 5:13 PM Angela [name]  MRN:  [id number] Subjective: Follow-up for this patient with bipolar disorder and personality disorder issues.  Patient seen chart reviewed.  Spoke with social work and nursing today.  Angela [name] appears to be having a relatively good day today.  She was able to hold a conversation without losing her temper or getting completely disorganized.  Affect was more appropriate.  She is fairly neatly dressed today.  Denies any current suicidal thought.  She did take the quetiapine last night without any problem.  She is meeting with Ms. Pauletta Browns the psychiatric social worker to work on disposition plans among other things which I think should be very beneficial. Principal Problem: Bipolar I disorder, most recent episode mixed, severe with psychotic features ([place]) Diagnosis:   Patient Active Problem List   Diagnosis Date Noted  . Bipolar I disorder, most recent episode mixed, severe with psychotic features ([place]) [F31.64] 09/18/2017  . Agitation [R45.1] 09/18/2017  . IBS (irritable bowel syndrome) [K58.9] 11/17/2016  . Bilateral carpal tunnel syndrome [G56.03] 11/17/2016  . Plantar fasciitis, bilateral [M72.2] 11/17/2016  . Chronic pain syndrome [G89.4] 11/17/2016  . Chronic hyponatremia [E87.1] 11/17/2016  . PTSD (post-traumatic stress disorder) [F43.10] 11/17/2016  . Hernia, hiatal [K44.9] 11/17/2016  . Vitamin D deficiency [E55.9] 11/17/2016  . Vitamin B12 deficiency [E53.8] 11/17/2016  . SIADH (syndrome of inappropriate ADH production) ([place]) [E22.2] 09/08/2016  . Cervical spondylosis with myelopathy [M47.12] 05/24/2016  . Post-concussion headache [G44.309] 05/24/2016  . MCI (mild cognitive impairment) with memory loss [G31.84] 03/30/2016  . Gait abnormality [R26.9] 03/30/2016  . Chronic bipolar affective disorder ([place]) [F31.9] 01/24/2016  . Anemia, iron deficiency [D50.9] 06/24/2015  . Endometriosis [N80.9] 06/24/2015  . Essential  hypertension [I10] 06/24/2015  . History of postmenopausal bleeding [Z87.42] 05/22/2015  . History of TIA (transient ischemic attack) [Z86.73] 04/20/2015  . Hypothyroidism [E03.9] 04/20/2015  . GERD (gastroesophageal reflux disease) [K21.9] 04/20/2015  . Cervical disc disorder with radiculopathy [M50.10] 03/25/2015  . Chronic neck pain [M54.2, G89.29] 03/25/2015  . Pelvic pain in female [R10.2] 10/30/2014  . History of skin cancer [Z85.828] 06/30/2014  . Chronic headaches [R51] 12/16/2013  . Neuropathy [G62.9] 12/16/2013   Total Time spent with patient: 20 minutes  Past Psychiatric History: Long history of chronic mental health problems as documented previously  Past Medical History:  Past Medical History:  Diagnosis Date  . Allergic rhinitis   . Anemia   . Blurred vision   . Depression   . Diverticulosis   . Endometriosis   . Falls   . GERD (gastroesophageal reflux disease)   . Hernia, hiatal   . Hypothyroidism   . IBS (irritable bowel syndrome)   . Malignant neoplasm of skin   . Migraine   . Neuropathy   . PTSD (post-traumatic stress disorder)   . Thyroid disease     Past Surgical History:  Procedure Laterality Date  . APPENDECTOMY    . CHOLECYSTECTOMY    . CHOLECYSTECTOMY, LAPAROSCOPIC    . ESOPHAGOGASTRODUODENOSCOPY (EGD) WITH PROPOFOL N/A 03/29/2015   Procedure: ESOPHAGOGASTRODUODENOSCOPY (EGD) WITH PROPOFOL;  Surgeon: Josefine Class, MD;  Location[facility] ENDOSCOPY;  Service: Endoscopy;  Laterality: N/A;  . HERNIA REPAIR    . laproscopy    . TONSILLECTOMY    . uteral suspension     Family History:  Family History  Problem Relation Age of Onset  . Heart disease Father   . Cancer Mother  lung  . Urolithiasis Neg Hx   . Kidney disease Neg Hx   . Kidney cancer Neg Hx   . Prostate cancer Neg Hx    Family Psychiatric  History: See previous notes Social History:  Social History   Substance and Sexual Activity  Alcohol Use No     Social History    Substance and Sexual Activity  Drug Use No    Social History   Socioeconomic History  . Marital status: Single    Spouse name: Not on file  . Number of children: 0  . Years of education: 1.5 years of college  . Highest education level: Not on file  Occupational History  . Occupation: Retired  Scientific laboratory technician  . Financial resource strain: Not on file  . Food insecurity:    Worry: Not on file    Inability: Not on file  . Transportation needs:    Medical: Not on file    Non-medical: Not on file  Tobacco Use  . Smoking status: Former Smoker    Last attempt to quit: 11/17/1976    Years since quitting: 40.9  . Smokeless tobacco: Never Used  Substance and Sexual Activity  . Alcohol use: No  . Drug use: No  . Sexual activity: Not Currently  Lifestyle  . Physical activity:    Days per week: Not on file    Minutes per session: Not on file  . Stress: Not on file  Relationships  . Social connections:    Talks on phone: Not on file    Gets together: Not on file    Attends religious service: Not on file    Active member of club or organization: Not on file    Attends meetings of clubs or organizations: Not on file    Relationship status: Not on file  Other Topics Concern  . Not on file  Social History Narrative   Lives at home alone.   Right-handed.   No daily caffeine use.   Additional Social History:    Pain Medications: See PTA Prescriptions: See PTA Over the Counter: See PTA History of alcohol / drug use?: No history of alcohol / drug abuse Longest period of sobriety (when/how long): Reports of none Negative Consequences of Use: (n/a) Withdrawal Symptoms: (n/a)                    Sleep: Fair  Appetite:  Fair  Current Medications: Current Facility-Administered Medications  Medication Dose Route Frequency Provider Last Rate Last Dose  . acetaminophen (TYLENOL) tablet 650 mg  650 mg Oral Q6H PRN Tran Arzuaga, Madie Reno, MD   650 mg at 10/19/17 1155  . alum &  mag hydroxide-simeth (MAALOX/MYLANTA) 200-200-20 MG/5ML suspension 30 mL  30 mL Oral Q4H PRN Rayland Hamed T, MD   30 mL at 10/08/17 1507  . amLODipine (NORVASC) tablet 2.5 mg  2.5 mg Oral BID Airrion Otting, Madie Reno, MD   2.5 mg at 10/19/17 1143  . calcium carbonate (TUMS - dosed in mg elemental calcium) chewable tablet 400 mg of elemental calcium  2 tablet Oral Q breakfast Pucilowska, Jolanta B, MD   400 mg of elemental calcium at 10/19/17 1145  . cholecalciferol (VITAMIN D) tablet 1,000 Units  1,000 Units Oral Daily Pucilowska, Jolanta B, MD   1,000 Units at 10/19/17 1144  . cycloSPORINE (RESTASIS) 0.05 % ophthalmic emulsion 1 drop  1 drop Both Eyes BID Dorothie Wah, Madie Reno, MD   1 drop at 10/19/17 1147  .  diclofenac sodium (VOLTAREN) 1 % transdermal gel 4 g  4 g Topical QID Pucilowska, Jolanta B, MD   4 g at 10/19/17 1151  . feeding supplement (BOOST / RESOURCE BREEZE) liquid 1 Container  1 Container Oral TID BM Chevi Lim, Madie Reno, MD   1 Container at 10/19/17 1444  . ferrous sulfate tablet 325 mg  325 mg Oral Q breakfast Pucilowska, Jolanta B, MD   325 mg at 10/19/17 1144  . gabapentin (NEURONTIN) capsule 100 mg  100 mg Oral BID Annjanette Wertenberger T, MD   100 mg at 10/19/17 1145  . glycopyrrolate (ROBINUL) injection 0.1 mg  0.1 mg Intravenous Once Shatina Streets T, MD      . hydrocortisone cream 1 %   Topical BID Rondy Krupinski, Madie Reno, MD   1 application at 23/55/73 1151  . levalbuterol (XOPENEX HFA) inhaler 2 puff  2 puff Inhalation Q8H PRN Pucilowska, Jolanta B, MD      . levothyroxine (SYNTHROID, LEVOTHROID) tablet 100 mcg  100 mcg Oral QAC breakfast Pucilowska, Jolanta B, MD   100 mcg at 10/19/17 1143  . lidocaine (LIDODERM) 5 % 1 patch  1 patch Transdermal Q24H Pucilowska, Jolanta B, MD   1 patch at 10/19/17 1518  . linaclotide (LINZESS) capsule 290 mcg  290 mcg Oral QAC breakfast Pucilowska, Jolanta B, MD   290 mcg at 10/19/17 1148  . lip balm (BLISTEX) ointment   Topical PRN [place] Hagger T, MD      . loperamide  (IMODIUM) capsule 2 mg  2 mg Oral PRN Pucilowska, Jolanta B, MD   2 mg at 10/18/17 0002  . LORazepam (ATIVAN) tablet 0.5 mg  0.5 mg Oral Q6H PRN Jaydis Duchene, Madie Reno, MD   0.5 mg at 10/19/17 1516  . magnesium hydroxide (MILK OF MAGNESIA) suspension 30 mL  30 mL Oral Daily PRN Glora Hulgan, Madie Reno, MD   30 mL at 10/05/17 1731  . magnesium oxide (MAG-OX) tablet 200 mg  200 mg Oral BID Pucilowska, Jolanta B, MD   200 mg at 10/19/17 1148  . midazolam (VERSED) injection 2 mg  2 mg Intravenous Once Leora Platt T, MD      . multivitamin with minerals tablet 1 tablet  1 tablet Oral Daily Pucilowska, Jolanta B, MD   1 tablet at 10/19/17 1144  . omega-3 acid ethyl esters (LOVAZA) capsule 1 g  1 g Oral BID Pucilowska, Jolanta B, MD   1 g at 10/18/17 1959  . ondansetron (ZOFRAN) injection 4 mg  4 mg Intravenous Once Shakiya Mcneary T, MD      . ondansetron [facility]) injection 4 mg  4 mg Intravenous Once PRN Gunnar Fusi, MD      . ondansetron [facility]) injection 4 mg  4 mg Intravenous Once PRN Gunnar Bulla, MD      . ondansetron [facility]) injection 4 mg  4 mg Intravenous Once PRN Alvin Critchley, MD      . ondansetron [facility]) injection 4 mg  4 mg Intravenous Once Emani Morad T, MD      . ondansetron (ZOFRAN-ODT) disintegrating tablet 4 mg  4 mg Oral Q8H PRN Pucilowska, Jolanta B, MD   4 mg at 10/08/17 1807  . PARoxetine (PAXIL-CR) 24 hr tablet 25 mg  25 mg Oral Daily Julita Ozbun T, MD   25 mg at 10/19/17 1147  . polyethylene glycol (MIRALAX / GLYCOLAX) packet 17 g  17 g Oral Daily Pucilowska, Jolanta B, MD      . polyvinyl  alcohol (LIQUIFILM TEARS) 1.4 % ophthalmic solution 1 drop  1 drop Both Eyes PRN Ramond Dial, MD   1 drop at 10/19/17 1514  . prazosin (MINIPRESS) capsule 2 mg  2 mg Oral QHS Aeralyn Barna, Madie Reno, MD   2 mg at 10/18/17 2338  . promethazine (PHENERGAN) tablet 12.5 mg  12.5 mg Oral Q6H PRN Breasia Karges T, MD   12.5 mg at 10/08/17 1647  . pyridOXINE (VITAMIN B-6) tablet 100 mg  100 mg Oral BID  Venessa Wickham T, MD   100 mg at 10/19/17 1146  . QUEtiapine (SEROQUEL) tablet 150 mg  150 mg Oral QHS Bemnet Trovato T, MD      . tiZANidine (ZANAFLEX) tablet 2 mg  2 mg Oral Q8H PRN Pucilowska, Jolanta B, MD   2 mg at 10/12/17 1740  . traZODone (DESYREL) tablet 150 mg  150 mg Oral QHS Chauncey Mann, MD   150 mg at 10/18/17 2307  . triamcinolone cream (KENALOG) 0.1 %   Topical TID PRN Ramond Dial, MD   1 application at 69/67/89 2106  . vitamin C (ASCORBIC ACID) tablet 1,000 mg  1,000 mg Oral Daily Pucilowska, Jolanta B, MD   1,000 mg at 10/19/17 1148    Lab Results: No results found for this or any previous visit (from the past 48 hour(s)).  Blood Alcohol level:  Lab Results  Component Value Date   ETH <5 09/29/2016   ETH <5 38/10/1749    Metabolic Disorder Labs: Lab Results  Component Value Date   HGBA1C 5.7 (H) 09/20/2017   MPG 116.89 09/20/2017   No results found for: PROLACTIN Lab Results  Component Value Date   CHOL 173 09/20/2017   TRIG 193 (H) 09/20/2017   HDL 50 09/20/2017   CHOLHDL 3.5 09/20/2017   VLDL 39 09/20/2017   LDLCALC 84 09/20/2017   LDLCALC 87 09/04/2017    Physical Findings: AIMS:  , ,  ,  ,    CIWA:    COWS:     Musculoskeletal: Strength & Muscle Tone: within normal limits Gait & Station: normal Patient leans: N/A  Psychiatric Specialty Exam: Physical Exam  Nursing note and vitals reviewed. Constitutional: She appears well-developed and well-nourished.  HENT:  Head: Normocephalic and atraumatic.  Eyes: Pupils are equal, round, and reactive to light. Conjunctivae are normal.  Neck: Normal range of motion.  Cardiovascular: Regular rhythm and normal heart sounds.  Respiratory: Effort normal. No respiratory distress.  GI: Soft.  Musculoskeletal: Normal range of motion.  Neurological: She is alert.  Skin: Skin is warm and dry.  Psychiatric: Her speech is normal. Her mood appears anxious. She is not agitated. Thought content is not  paranoid. She expresses impulsivity. She expresses no homicidal and no suicidal ideation. She exhibits abnormal recent memory.    Review of Systems  Constitutional: Negative.   HENT: Negative.   Eyes: Negative.   Respiratory: Negative.   Cardiovascular: Negative.   Gastrointestinal: Negative.   Musculoskeletal: Negative.   Skin: Negative.   Neurological: Negative.   Psychiatric/Behavioral: Positive for depression. Negative for hallucinations, memory loss, substance abuse and suicidal ideas. The patient is nervous/anxious. The patient does not have insomnia.     Blood pressure 133/69, pulse (!) 52, temperature 98.4 F (36.9 C), temperature source Oral, resp. rate 18, height 5\' 3"  (1.6 m), weight 53.1 kg, SpO2 94 %.Body mass index is 20.73 kg/m.  General Appearance: Casual  Eye Contact:  Fair  Speech:  Clear and Coherent  Volume:  Normal  Mood:  Euthymic  Affect:  Constricted  Thought Process:  Goal Directed  Orientation:  Full (Time, Place, and Person)  Thought Content:  Rumination  Suicidal Thoughts:  No  Homicidal Thoughts:  No  Memory:  Immediate;   Fair Recent;   Fair Remote;   Fair  Judgement:  Fair  Insight:  Fair  Psychomotor Activity:  Decreased  Concentration:  Concentration: Fair  Recall:  [name]:  Fair  Language:  Fair  Akathisia:  No  Handed:  Right  AIMS (if indicated):     Assets:  Desire for Improvement Physical Health  ADL's:  Intact  Cognition:  Impaired,  Mild  Sleep:  Number of Hours: 4.3     Treatment Plan Summary: Daily contact with patient to assess and evaluate symptoms and progress in treatment, Medication management and Plan As ever the fundamental problem is that she has no safe adequate place to live.  I am very much hoping that now that she is meeting with a Education officer, museum we may be able to break some of that deadlock and come up with a solution.  In the meantime I hope that we can find a way to get her symptoms improved so  that they stay improved after discharge.  I am going to increase the Seroquel to 150 mg at night given that she tolerated the first dose without any problem.  ECT will be scheduled for Monday as well to which she is agreeable.  Supportive counseling and review plan with patient.  Alethia Berthold, MD 10/19/2017, 5:13 PM

## 2017-10-19 NOTE — Progress Notes (Signed)
Recreation Therapy Notes  Date: 10/19/2017  Time: 9:30 am   Location: Craft room   Behavioral response: N/A   Intervention Topic: Creative Expressions  Discussion/Intervention: Patient did not attend group.   Clinical Observations/Feedback:  Patient did not attend group.   Adalis Gatti LRT/CTRS        Mattia Osterman 10/19/2017 11:27 AM

## 2017-10-19 NOTE — Progress Notes (Signed)
LCSW met with patient and has scheduled a 45 minute block of time starting at 4:15pm today to discuss her housing and medical pursuits. Spoke with Occupational hygienist and this worker will provide 1 hour each day over the weekend to deter the patients increased need for attention but yet support this patient in an efficient and supportive way. Clear boundaries have been set and this worker will return this afternoon and patient is very agreeable with a 45 minute allotment of time.  [employer] LCSW [phone]
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
## 2017-10-19 NOTE — Progress Notes (Signed)
LCSW met with patient and opened the floor to discuss where she would like to start.  She wanted to call her doctor and look up his phone number. LCSW supported patient and completed this task and recorded his information in her note book. We made 3 messages all which patient wanted to delete and agreed she would call on her own.  Her next request was discuss SNF in [place] and this worker explained she does not meet inpatient criteria for SNF- PT has recommended home health PT and OT no SNF as she does not meet a STR stay. Patient does understand. And wants a list of ALF and Senior residences.  She would like this worker to get a PASSR number- will do tommorow  She would like me to find the number of [facility] [phone] fax number [phone] , I explained that Im not allowed to call with out a signed consent and discussed that during our meeting tomorrow we can get down to some serious decision making. She reported she spoke to a Stanton Kidney ( attorney) who will not advocate on her behalf so she needs to find an attorney to obtain her monies in trust from her brother and fathers estate. LCSW reviewed that LCSW are not allowed to get involved in any finnacial estates or even refer a Chief Executive Officer. She agreed and will accept phone numbers.  Other LCSW Davy Pique Provided this worker with an extensive organized list of housing options both in [place] and [place]. This worker will present this to patient in the morning.  LCSW  Lael Pilch LCSW

## 2017-10-19 NOTE — [facility] Group Notes (Signed)
[facility] Group Notes:  (Nursing/MHT/Case Management/Adjunct)  Date:  10/19/2017  Time:  9:30 AM  Type of Therapy:  Psychoeducational Skills  Participation Level:  Did Not Attend  Angela [name] 10/19/2017, 9:30 AM

## 2017-10-19 NOTE — Progress Notes (Signed)
Arrived to speak with patient per request of patient's RN. Patient on phone now.

## 2017-10-19 NOTE — Progress Notes (Addendum)
D: Pt denies SI/HI/AVH, verbally will contract for safety. Pt. Continues to be severely verbally aggressive and posturing towards staff and this Probation officer. Pt. Frequently yells at staff stating things such as, "I haven't seen a Education officer, museum since I've been here!", "how about we start treating Skyanne and start taking care of what Kimaria needs instead of everyone else!", "this whole hospital can go to hell!", "I'm not ready for bed, so no, I will not be taking my medications right now, and you need to stop trying to tell me what to do, I'm at least twice your age!". Pt. Continues to present with mulitiple somatic complaints such as, "my eyes are blood shot red why haven't I received the allergy medications I requested!". Patient's eyes assessed by this RN to be WDL. Pt. Continues to report broken ribs that upon assessment of breathing and symmetry are WDL. Pt. Refuses to utilize front wheel walker she requests to use for safety. Pt. Ambulating appropriately. Pt. Reports pain 10/10 and states, "It hurts all over all the time!". Pt. Denies PRN medications for pain.    A: Q x 15 minute observation checks were completed for safety. Patient was provided with education, but refuses.  Patient was given/offered medications per orders. Patient  was encourage to attend groups, participate in unit activities and continue with plan of care. Pt. Chart and plans of care reviewed. Pt. Given support and encouragement.   R: Patient is not complaint with medication and unit procedures. Pt. Continues to refuse to take her medications at the prescribed timings per MD orders. Pt. Attempts to barter taking her medications in return for demands. Pt. Not able to verbalize positive feelings this evening.           Precautionary checks every 15 minutes for safety maintained, room free of safety hazards, patient sustains no injury or falls during this shift. Will endorse care to next shift.

## 2017-10-19 NOTE — Plan of Care (Signed)
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

## 2017-10-19 NOTE — Plan of Care (Signed)
Pt. Denies SI/HI and verbally will contract for safety. Pt. Is not complaint with medications. Pt. Continues to refuse to take her medications and the prescribed timing per MD orders. Pt. Attempts to barter taking her medications in return for demands. Pt. Not able to verbalize positive feelings this evening.    Problem: Self-Concept: Goal: Ability to disclose and discuss suicidal ideas will improve Outcome: Progressing   Problem: Safety: Goal: Ability to remain free from injury will improve Outcome: Progressing   Problem: Medication: Goal: Compliance with prescribed medication regimen will improve Outcome: Not Progressing   Problem: Self-Concept: Goal: Will verbalize positive feelings about self Outcome: Not Progressing

## 2017-10-20 MED ORDER — QUETIAPINE FUMARATE 100 MG PO TABS
100.0000 mg | ORAL_TABLET | Freq: Every day | ORAL | Status: DC
  Administered 2017-10-20: 100 mg via ORAL
  Filled 2017-10-20 (×2): qty 1

## 2017-10-20 NOTE — Plan of Care (Signed)
  Problem: Coping: Goal: Coping ability will improve Outcome: Progressing   Problem: Self-Concept: Goal: Ability to disclose and discuss suicidal ideas will improve Outcome: Progressing Goal: Will verbalize positive feelings about self Outcome: Progressing   Problem: Safety: Goal: Ability to remain free from injury will improve Outcome: Progressing   Problem: Spiritual Needs Goal: Ability to function at adequate level Outcome: Progressing

## 2017-10-20 NOTE — Plan of Care (Signed)
D: Patient denies SI/HI/AVH. Patient has had multiple complaints today about stay. Patient does not follow prescribed medication times and will only take medication after multiple attempts. Patient requested respiratory treatment but refused when respiratory came on unit. Patient remains in room for most of day.   A: Patient was assessed by this nurse. Patient was oriented to unit. Patient's safety was maintained on unit. Q x 15 minute observation checks were completed for safety. Patient care plan was reviewed. Patient was offered support and encouragement. Patient was encourage to attend groups, participate in unit activities and continue with plan of care.    R: Patient safety maintained on unit.     Problem: Self-Concept: Goal: Ability to disclose and discuss suicidal ideas will improve Outcome: Progressing   Problem: Safety: Goal: Ability to remain free from injury will improve Outcome: Progressing   Problem: Education: Goal: Ability to make informed decisions regarding treatment will improve Outcome: Not Progressing   Problem: Coping: Goal: Coping ability will improve Outcome: Not Progressing

## 2017-10-20 NOTE — Progress Notes (Signed)
Informed patient that morning medications were available, patient understand information, remained in bed.

## 2017-10-20 NOTE — Progress Notes (Signed)
[facility] MD Progress Note  10/20/2017 2:48 PM Angela [name]  MRN:  [id number]   Subjective: Pt seen and chart reviewed. She expressed strong dissatisfaction with the increase of seroquel, and threatened to refuse to take it.  She believed that seroquel "made me see snakes in my room" last night.  She stated that she has a lot medical education and went to [employer]. And insisted that the meds we gave her are not right.  I agreed to reduce seroquel back to '100mg'$  qhs tonight.  Then she said that her PCP told her that Paxil gave her SIADH.  She is reassured that her Na level is WNL.   She then takes about her lung, insisted that she can't breath well. She believes that we don't give her the breathing medicines that her pulmonologist, Dr. Raul Del has ordered. She is assured that she does have PRN levalbuterol nebulizer if she needs it. And she is assured that her breathing is ok, as she has no problem with her breathing while talking or even arguing with the writer, and no SOB while walker either.   She said that her problem is not bipolar, and it is "borderline", and she knows that she needs DBT. She also wants to have grief counseling for the loss of her family and friends.  She is informed that these treatment will be available as outpatient.   Otherwise, no SI or Hi. No AVH.   Principal Problem: Bipolar I disorder, most recent episode mixed, severe with psychotic features ([place]) Diagnosis:   Patient Active Problem List   Diagnosis Date Noted  . Bipolar I disorder, most recent episode mixed, severe with psychotic features ([place]) [F31.64] 09/18/2017  . Agitation [R45.1] 09/18/2017  . IBS (irritable bowel syndrome) [K58.9] 11/17/2016  . Bilateral carpal tunnel syndrome [G56.03] 11/17/2016  . Plantar fasciitis, bilateral [M72.2] 11/17/2016  . Chronic pain syndrome [G89.4] 11/17/2016  . Chronic hyponatremia [E87.1] 11/17/2016  . PTSD (post-traumatic stress disorder) [F43.10] 11/17/2016  . Hernia,  hiatal [K44.9] 11/17/2016  . Vitamin D deficiency [E55.9] 11/17/2016  . Vitamin B12 deficiency [E53.8] 11/17/2016  . SIADH (syndrome of inappropriate ADH production) ([place]) [E22.2] 09/08/2016  . Cervical spondylosis with myelopathy [M47.12] 05/24/2016  . Post-concussion headache [G44.309] 05/24/2016  . MCI (mild cognitive impairment) with memory loss [G31.84] 03/30/2016  . Gait abnormality [R26.9] 03/30/2016  . Chronic bipolar affective disorder ([place]) [F31.9] 01/24/2016  . Anemia, iron deficiency [D50.9] 06/24/2015  . Endometriosis [N80.9] 06/24/2015  . Essential hypertension [I10] 06/24/2015  . History of postmenopausal bleeding [Z87.42] 05/22/2015  . History of TIA (transient ischemic attack) [Z86.73] 04/20/2015  . Hypothyroidism [E03.9] 04/20/2015  . GERD (gastroesophageal reflux disease) [K21.9] 04/20/2015  . Cervical disc disorder with radiculopathy [M50.10] 03/25/2015  . Chronic neck pain [M54.2, G89.29] 03/25/2015  . Pelvic pain in female [R10.2] 10/30/2014  . History of skin cancer [Z85.828] 06/30/2014  . Chronic headaches [R51] 12/16/2013  . Neuropathy [G62.9] 12/16/2013   Total Time spent with patient: 20 minutes  Past Psychiatric History: Long history of chronic bipolar disorder and personality disorder and poor function  Past Medical History:  Past Medical History:  Diagnosis Date  . Allergic rhinitis   . Anemia   . Blurred vision   . Depression   . Diverticulosis   . Endometriosis   . Falls   . GERD (gastroesophageal reflux disease)   . Hernia, hiatal   . Hypothyroidism   . IBS (irritable bowel syndrome)   . Malignant neoplasm of  skin   . Migraine   . Neuropathy   . PTSD (post-traumatic stress disorder)   . Thyroid disease     Past Surgical History:  Procedure Laterality Date  . APPENDECTOMY    . CHOLECYSTECTOMY    . CHOLECYSTECTOMY, LAPAROSCOPIC    . ESOPHAGOGASTRODUODENOSCOPY (EGD) WITH PROPOFOL N/A 03/29/2015   Procedure: ESOPHAGOGASTRODUODENOSCOPY  (EGD) WITH PROPOFOL;  Surgeon: Josefine Class, MD;  Location[facility]COPY;  Service: Endoscopy;  Laterality: N/A;  . HERNIA REPAIR    . laproscopy    . TONSILLECTOMY    . uteral suspension     Family History:  Family History  Problem Relation Age of Onset  . Heart disease Father   . Cancer Mother        lung  . Urolithiasis Neg Hx   . Kidney disease Neg Hx   . Kidney cancer Neg Hx   . Prostate cancer Neg Hx    Family Psychiatric  History: See previous note Social History:  Social History   Substance and Sexual Activity  Alcohol Use No     Social History   Substance and Sexual Activity  Drug Use No    Social History   Socioeconomic History  . Marital status: Single    Spouse name: Not on file  . Number of children: 0  . Years of education: 1.5 years of college  . Highest education level: Not on file  Occupational History  . Occupation: Retired  Scientific laboratory technician  . Financial resource strain: Not on file  . Food insecurity:    Worry: Not on file    Inability: Not on file  . Transportation needs:    Medical: Not on file    Non-medical: Not on file  Tobacco Use  . Smoking status: Former Smoker    Last attempt to quit: 11/17/1976    Years since quitting: 40.9  . Smokeless tobacco: Never Used  Substance and Sexual Activity  . Alcohol use: No  . Drug use: No  . Sexual activity: Not Currently  Lifestyle  . Physical activity:    Days per week: Not on file    Minutes per session: Not on file  . Stress: Not on file  Relationships  . Social connections:    Talks on phone: Not on file    Gets together: Not on file    Attends religious service: Not on file    Active member of club or organization: Not on file    Attends meetings of clubs or organizations: Not on file    Relationship status: Not on file  Other Topics Concern  . Not on file  Social History Narrative   Lives at home alone.   Right-handed.   No daily caffeine use.   Additional Social  History:    Pain Medications: See PTA Prescriptions: See PTA Over the Counter: See PTA History of alcohol / drug use?: No history of alcohol / drug abuse Longest period of sobriety (when/how long): Reports of none Negative Consequences of Use: (n/a) Withdrawal Symptoms: (n/a)  Sleep: Fair  Appetite:  Fair  Current Medications: Current Facility-Administered Medications  Medication Dose Route Frequency Provider Last Rate Last Dose  . acetaminophen (TYLENOL) tablet 650 mg  650 mg Oral Q6H PRN Clapacs, Madie Reno, MD   650 mg at 10/20/17 1324  . alum & mag hydroxide-simeth (MAALOX/MYLANTA) 200-200-20 MG/5ML suspension 30 mL  30 mL Oral Q4H PRN Clapacs, John T, MD   30 mL at 10/08/17 1507  .  amLODipine (NORVASC) tablet 2.5 mg  2.5 mg Oral BID Clapacs, John T, MD   2.5 mg at 10/20/17 1314  . calcium carbonate (TUMS - dosed in mg elemental calcium) chewable tablet 400 mg of elemental calcium  2 tablet Oral Q breakfast Pucilowska, Jolanta B, MD   400 mg of elemental calcium at 10/20/17 1315  . cholecalciferol (VITAMIN D) tablet 1,000 Units  1,000 Units Oral Daily Pucilowska, Jolanta B, MD   1,000 Units at 10/20/17 1312  . cycloSPORINE (RESTASIS) 0.05 % ophthalmic emulsion 1 drop  1 drop Both Eyes BID Clapacs, Madie Reno, MD   1 drop at 10/20/17 1312  . diclofenac sodium (VOLTAREN) 1 % transdermal gel 4 g  4 g Topical QID Pucilowska, Jolanta B, MD   4 g at 10/20/17 1326  . feeding supplement (BOOST / RESOURCE BREEZE) liquid 1 Container  1 Container Oral TID BM Clapacs, Madie Reno, MD   1 Container at 10/20/17 1331  . ferrous sulfate tablet 325 mg  325 mg Oral Q breakfast Pucilowska, Jolanta B, MD   325 mg at 10/20/17 1312  . gabapentin (NEURONTIN) capsule 100 mg  100 mg Oral BID Clapacs, Madie Reno, MD   100 mg at 10/20/17 1312  . glycopyrrolate (ROBINUL) injection 0.1 mg  0.1 mg Intravenous Once Clapacs, John T, MD      . hydrocortisone cream 1 %   Topical BID Clapacs, John T, MD      . levalbuterol (XOPENEX)  nebulizer solution 0.63 mg  0.63 mg Nebulization Q8H PRN Pucilowska, Jolanta B, MD      . levothyroxine (SYNTHROID, LEVOTHROID) tablet 100 mcg  100 mcg Oral QAC breakfast Pucilowska, Jolanta B, MD   100 mcg at 10/20/17 0640  . lidocaine (LIDODERM) 5 % 1 patch  1 patch Transdermal Q24H Pucilowska, Jolanta B, MD   1 patch at 10/20/17 1313  . linaclotide (LINZESS) capsule 290 mcg  290 mcg Oral QAC breakfast Pucilowska, Jolanta B, MD   290 mcg at 10/20/17 0640  . lip balm (BLISTEX) ointment   Topical PRN Clapacs, John T, MD      . loperamide (IMODIUM) capsule 2 mg  2 mg Oral PRN Pucilowska, Jolanta B, MD   2 mg at 10/18/17 0002  . LORazepam (ATIVAN) tablet 0.5 mg  0.5 mg Oral Q6H PRN Clapacs, Madie Reno, MD   0.5 mg at 10/19/17 1516  . magnesium hydroxide (MILK OF MAGNESIA) suspension 30 mL  30 mL Oral Daily PRN Clapacs, Madie Reno, MD   30 mL at 10/05/17 1731  . magnesium oxide (MAG-OX) tablet 200 mg  200 mg Oral BID Pucilowska, Jolanta B, MD   200 mg at 10/20/17 1312  . midazolam (VERSED) injection 2 mg  2 mg Intravenous Once Clapacs, John T, MD      . multivitamin with minerals tablet 1 tablet  1 tablet Oral Daily Pucilowska, Jolanta B, MD   1 tablet at 10/20/17 1312  . omega-3 acid ethyl esters (LOVAZA) capsule 1 g  1 g Oral BID Pucilowska, Jolanta B, MD   1 g at 10/20/17 1310  . ondansetron (ZOFRAN) injection 4 mg  4 mg Intravenous Once Clapacs, John T, MD      . ondansetron [facility]) injection 4 mg  4 mg Intravenous Once PRN Gunnar Fusi, MD      . ondansetron [facility]) injection 4 mg  4 mg Intravenous Once PRN Gunnar Bulla, MD      . ondansetron [facility]) injection 4 mg  4 mg Intravenous Once PRN Alvin Critchley, MD      . ondansetron [facility]) injection 4 mg  4 mg Intravenous Once Clapacs, John T, MD      . ondansetron (ZOFRAN-ODT) disintegrating tablet 4 mg  4 mg Oral Q8H PRN Pucilowska, Jolanta B, MD   4 mg at 10/08/17 1807  . PARoxetine (PAXIL-CR) 24 hr tablet 25 mg  25 mg Oral Daily Clapacs, Madie Reno, MD   25 mg at 10/20/17 1312  . polyethylene glycol (MIRALAX / GLYCOLAX) packet 17 g  17 g Oral Daily Pucilowska, Jolanta B, MD      . polyvinyl alcohol (LIQUIFILM TEARS) 1.4 % ophthalmic solution 1 drop  1 drop Both Eyes PRN Ramond Dial, MD   1 drop at 10/19/17 1514  . prazosin (MINIPRESS) capsule 2 mg  2 mg Oral QHS Clapacs, Madie Reno, MD   2 mg at 10/19/17 2234  . promethazine (PHENERGAN) tablet 12.5 mg  12.5 mg Oral Q6H PRN Clapacs, John T, MD   12.5 mg at 10/08/17 1647  . pyridOXINE (VITAMIN B-6) tablet 100 mg  100 mg Oral BID Clapacs, Madie Reno, MD   100 mg at 10/20/17 1311  . QUEtiapine (SEROQUEL) tablet 150 mg  150 mg Oral QHS Clapacs, Madie Reno, MD   150 mg at 10/19/17 2234  . tiZANidine (ZANAFLEX) tablet 2 mg  2 mg Oral Q8H PRN Pucilowska, Jolanta B, MD   2 mg at 10/19/17 2236  . traZODone (DESYREL) tablet 150 mg  150 mg Oral QHS Chauncey Mann, MD   150 mg at 10/19/17 2234  . triamcinolone cream (KENALOG) 0.1 %   Topical TID PRN Ramond Dial, MD   1 application at 81/01/75 2106  . vitamin C (ASCORBIC ACID) tablet 1,000 mg  1,000 mg Oral Daily Pucilowska, Jolanta B, MD   1,000 mg at 10/20/17 1311    Lab Results:  No results found for this or any previous visit (from the past 48 hour(s)).  Blood Alcohol level:  Lab Results  Component Value Date   ETH <5 09/29/2016   ETH <5 11/09/8525    Metabolic Disorder Labs: Lab Results  Component Value Date   HGBA1C 5.7 (H) 09/20/2017   MPG 116.89 09/20/2017   No results found for: PROLACTIN Lab Results  Component Value Date   CHOL 173 09/20/2017   TRIG 193 (H) 09/20/2017   HDL 50 09/20/2017   CHOLHDL 3.5 09/20/2017   VLDL 39 09/20/2017   LDLCALC 84 09/20/2017   LDLCALC 87 09/04/2017    Physical Findings: AIMS:  , ,  ,  ,    CIWA:    COWS:     Musculoskeletal: Strength & Muscle Tone: within normal limits Gait & Station: normal Patient leans: N/A  Psychiatric Specialty Exam: Physical Exam  Nursing note and vitals  reviewed. Constitutional: She appears well-developed and well-nourished.  HENT:  Head: Normocephalic and atraumatic.  Eyes: Pupils are equal, round, and reactive to light. Conjunctivae are normal.  Neck: Normal range of motion.  Cardiovascular: Regular rhythm and normal heart sounds.  Respiratory: Effort normal. No respiratory distress.  GI: Soft.  Musculoskeletal: Normal range of motion.  Neurological: She is alert.  Skin: Skin is warm and dry.  Psychiatric: Her affect is not blunt. She is agitated. She is not aggressive. Thought content is not paranoid. She expresses impulsivity. She exhibits a depressed mood. She expresses no homicidal and no suicidal ideation. She exhibits abnormal recent memory.    Review  of Systems  Constitutional: Negative.   HENT: Negative.   Eyes: Negative.   Respiratory: Negative.   Cardiovascular: Negative.   Gastrointestinal: Negative.   Musculoskeletal: Negative.   Skin: Negative.   Neurological: Negative.   Psychiatric/Behavioral: Positive for depression and memory loss. Negative for hallucinations, substance abuse and suicidal ideas. The patient is nervous/anxious and has insomnia.     Blood pressure 117/66, pulse (!) 54, temperature 97.8 F (36.6 C), temperature source Oral, resp. rate 15, height '5\' 3"'$  (1.6 m), weight 53.1 kg, SpO2 93 %.Body mass index is 20.73 kg/m.  General Appearance: Casual  Eye Contact:  Fair  Speech:  Clear and Coherent  Volume:  Decreased  Mood:  Dysphoric and Irritable  Affect:  Congruent  Thought Process:  Goal Directed  Orientation:  Full (Time, Place, and Person)  Thought Content:  Rumination and Tangential  Suicidal Thoughts:  No  Homicidal Thoughts:  No  Memory:  Immediate;   Fair Recent;   Fair Remote;   Fair  Judgement:  Impaired  Insight:  Shallow  Psychomotor Activity:  Decreased  Concentration:  Concentration: Poor  Recall:  [name]:  Fair  Language:  Fair        Assets:  Desire  for Improvement  ADL's:  Intact  Cognition:  Impaired,  Mild  Sleep:  Number of Hours: 5.75     Treatment Plan Summary: Daily contact with patient to assess and evaluate symptoms and progress in treatment, Medication management and Plan ECT without difficulty.  Slept a little better last night.  As usual when Bobette Leyh stopped to talk with her one-on-one she has a large number of complaints but left to her own devices she is less disorganized and more appropriate.  Treatment team met today and discussed discharge planning.  It will be difficult finding appropriate disposition for her so we will have to start looking for places.  Asked the patient to please try and be involved with that.  For now continue current medication.   Plan: # Bipolar Affective disorder -- continue ECT -- continue paxil '20mg'$  daily, watching for anticholinergic side effects.  Her Na is 140 on 10/15/17. Pt claimed that she had SIADH.  -- reduce seroquel from '150mg'$  to '100mg'$  qhs, as pt threatening to stop taking it.  -- no SI or HI, no AVH.    # PTSD: -- continue Prazosin '2mg'$  qhs.  -- continue Trazodone '50mg'$  qhs.  -- recommend trauma therapy as outpatient   # Rash -- continue Triamcinolone cream.  -- no indication for Permethrin for scabies at this time.  Will continue monitor.   # HTN -- continue Norvasc 2.'5mg'$  po BID, however pt's BP is WNL -- defer the primary team for indication of whether to continue ECT.   #  Hypothyroidism -- continue Synthroid 136mg daily and TSH is 0.95, WNL on 09/20/17   Dispo: -- defer to primary team.    Cauy Melody, MD 10/20/2017, 2:48 PM

## 2017-10-20 NOTE — Plan of Care (Signed)
  Problem: Coping: Goal: Coping ability will improve Outcome: Not Progressing  Patient is not interacting with staff and peers appropriately.

## 2017-10-20 NOTE — Progress Notes (Signed)
OT Cancellation Note  Patient Details Name: Cherise Fedder MRN: [id number] DOB: June 20, 1950   Cancelled Treatment:    Reason Eval/Treat Not Completed: Other (comment)Attempted to meet with pt this date, pt meeting with social work this date discussing care plan. Will continue to follow as pt available and appropriate.  Zenovia Jarred, MSOT, OTR/L Behavioral Health OT/ Acute Relief OT   Zenovia Jarred 10/20/2017, 4:09 PM

## 2017-10-20 NOTE — B[facility] Group Notes (Signed)
LCSW Group Therapy Note   10/20/2017 1:15pm   Type of Therapy and Topic:  Group Therapy:  Trust and Honesty  Participation Level:  Did Not Attend  Description of Group:    In this group patients will be asked to explore the value of being honest.  Patients will be guided to discuss their thoughts, feelings, and behaviors related to honesty and trusting in others. Patients will process together how trust and honesty relate to forming relationships with peers, family members, and self. Each patient will be challenged to identify and express feelings of being vulnerable. Patients will discuss reasons why people are dishonest and identify alternative outcomes if one was truthful (to self or others). This group will be process-oriented, with patients participating in exploration of their own experiences, giving and receiving support, and processing challenge from other group members.   Therapeutic Goals: 1. Patient will identify why honesty is important to relationships and how honesty overall affects relationships.  2. Patient will identify a situation where they lied or were lied too and the  feelings, thought process, and behaviors surrounding the situation 3. Patient will identify the meaning of being vulnerable, how that feels, and how that correlates to being honest with self and others. 4. Patient will identify situations where they could have told the truth, but instead lied and explain reasons of dishonesty.   Summary of Patient Progress: Pt was invited to attend group but chose not to attend. CSW will continue to encourage pt to attend group throughout their admission.     Therapeutic Modalities:   Cognitive Behavioral Therapy Solution Focused Therapy Motivational Interviewing Brief Therapy  Natnael Biederman  [name], LCSW 10/20/2017 11:57 AM  

## 2017-10-20 NOTE — Progress Notes (Signed)
Patient alert and oriented x 4 denies SI/HI/AVH, no distress noted, affect is blunted mood irritable, appears anxious, very angry, at attending MD, expressed disdain and dissatisfaction at the care she gets at the hospital and wants to be discharged to a private apartment. Patient's  thoughts are organized and coherent, speech is loud and she is very dominating and demanding at the staff. Patient compliant with medication, 15 minutes safety checks maintained will continue to monitor.

## 2017-10-20 NOTE — Progress Notes (Signed)
Nursing note 7p-7a  Pt observed interacting with peers on unit this shift. Displayed a bright affect and bright/ pleasant mood upon interaction with this Probation officer. Pt spoke of God speaking to her telling her to go back to her home land of [place] ad seeing the place she was born. Pt seemed to have some mild confusion and unable to remember questions that she came to the nursing station to ask, also forgetting dates when talking about events. Pt spoke of having many deaths in her life the last 2 years and that makes her feel sad. Pt had minimal somatic complains this shift. Refused to take trazodone 50 mg and Seroquel this shift after education, redirection and multiple attempts. Stated " I am not crazy, I know what my body needs. I don't need that much medication. That mess makes me see snakes every where its horrible. If I take too much trazodone I will sleep all day tomorrow" Pt having increased anxiety regarding medication administration, pt educated on need and encouraged, pt continued to refuse. Stated " this is why my blood pressure is high, my blood pressure was fine before I came to this place" BP 158/103 p 71 scheduled minipress and Ativan 0.5mg  administered per MD order. Pt denies pain, but stated that he needs to soak her wrists at night and requested a basin. Pt denies SI/HI, and also denies any audio or visual hallucinations at this time.  Pt requesting for respiratory therapy to come back after previously refusing, and apologized for previously refusing. Pt not in any respiratory distress, but states " I need my breathing treatment." Pt is able to verbally contract for safety with this RN. Goal: " get my medications back in order once I leave this place." Pt is now resting in bed with eyes closed, with no signs or symptoms of pain or distress noted. Pt continues to remain safe on the unit and is observed by rounding every 15 min. RN will continue to monitor.

## 2017-10-20 NOTE — Progress Notes (Signed)
LCSW provided 1-1 support for patient.   LCSW provided a retirement magazine that had good articles for retirement and seniors. I did remove the one article ( Doctors are not always right) asper LCSW felt this would not be appropriate in building future relationships and or establishing trust.   LCSW spoke to patient about a [facility] in [place] " Changing Lives" and patient is agreeable for me to send her information ( signed consent) providing it is correct. Patient continues to disagree with Psychiatric diagnoses of Bipolar and stated " Im depressed, I have grief and yes Im borderline personality". LCSW explained Fl2 would have to accompany it.  LCSW provided words of encouragement and reminded her that diagnoses don't define her but her behavior does. She reports she feels better supported and had a long talk with Annia LCSW today. She is encouraged by God to write a Child's Book now, she was also very happy with the apartment list that Davy Pique the other LCSW compiled for her.   Today we reviewed a few places and this LCSW agreed to print off the application and assist her in faxing it over to either [employer].  LCSW reviewed that under no circumstance would I call her [place] or handle any financial dealing on her behalf and encouraged patient to call this organization and request to talk to a  Customer service representative and explain her displeasure with past service and to request a step by step written notice on what she can access her trust monies on and how she can do it.  LCSW agreed to meet with patient same time.

## 2017-10-21 MED ORDER — IPRATROPIUM BROMIDE 0.03 % NA SOLN
2.0000 | Freq: Three times a day (TID) | NASAL | Status: DC
  Administered 2017-10-21 – 2017-11-05 (×32): 2 via NASAL
  Filled 2017-10-21 (×2): qty 30

## 2017-10-21 NOTE — [facility] Group Notes (Signed)
[facility] Group Notes:  (Nursing/MHT/Case Management/Adjunct)  Date:  10/21/2017  Time:  9:46 PM  Type of Therapy:  Group Therapy  Participation Level:  Active  Participation Quality:  Appropriate  Affect:  Appropriate  Cognitive:  Appropriate  Insight:  Appropriate  Engagement in Group:  Engaged  Modes of Intervention:  Discussion  Summary of Progress/Problems: Analee complained about her medications.] MHT informed group they would have to discuss medication concerns with their prescribing doctors. MHT encouraged Kida to speak with her doctor about concerns. MHT went over the rules and expectations of the unit. MHT reviewed visitation and call hours. MHT informed patients not to go in other patient's rooms. MHT informed patients not to share or loan out clothes MHT informed patients not to share personal information or use last names while on the unit. MHT informed patients of routine checks throughout the night. MHT encouraged patients to clean up after themselves  [name] 10/21/2017, 9:46 PM

## 2017-10-21 NOTE — [facility] Group Notes (Signed)
LCSW Group Therapy Note 10/21/2017 1:15pm  Type of Therapy and Topic: Group Therapy: Feelings Around Returning Home & Establishing a Supportive Framework and Supporting Oneself When Supports Not Available  Participation Level: Active  Description of Group:  Patients first processed thoughts and feelings about upcoming discharge. These included fears of upcoming changes, lack of change, new living environments, judgements and expectations from others and overall stigma of mental health issues. The group then discussed the definition of a supportive framework, what that looks and feels like, and how do to discern it from an unhealthy non-supportive network. The group identified different types of supports as well as what to do when your family/friends are less than helpful or unavailable  Therapeutic Goals  1. Patient will identify one healthy supportive network that they can use at discharge. 2. Patient will identify one factor of a supportive framework and how to tell it from an unhealthy network. 3. Patient able to identify one coping skill to use when they do not have positive supports from others. 4. Patient will demonstrate ability to communicate their needs through discussion and/or role plays.  Summary of Patient Progress:  The patient reports she was able to sleep last night. Pt engaged during group session. As patients processed their anxiety about discharge and described healthy supports patient shared she is not ready to be discharge. She states, "I don't know where I am going to be living. I'm growing and I'm deciding what I want in my life."  Patients identified at least one self-care tool they were willing to use after discharge.   Therapeutic Modalities Cognitive Behavioral Therapy Motivational Interviewing   Angela [name]  [name], LCSW 10/21/2017 11:35 AM

## 2017-10-21 NOTE — Progress Notes (Signed)
Made patient aware that scheduled medications were available. Patient acknowledged nurse but remained in bed. When asked if nurse should bring medication to room patient turned head and would not continue conversation. Will continue to monitor patient.

## 2017-10-21 NOTE — Progress Notes (Signed)
Multiple attempts to administer 0700 medications, pt refusing to get up. Will pass along to oncoming shift.

## 2017-10-21 NOTE — Progress Notes (Signed)
LCSW consulted with BMU LCSW Annia and BMU nurse before meeting the patient. LCSW requested to keep today's visit shorter and focused .   LCSW provided patient a greeting which was received well. I provided her [place] apartment handout and explained that even though they had low rents there was several Probation officer, water and minor HBO fees- so 700.00 could easily be 950.00 and it was reviewed that this apartment might be too high for her budget.   She reported after having nebulizer treatment and allergy medicine she did not need that " Seroquil or Tramadol and that fresh air cures everything". And she reports she has never felt better.   Patient reviewed her own progress today and reports she feels she is not ready quite yet for DC as she still has some moments from God, then she reports she is very depressed still and in chronic pain which makes her angry at times  LCSW had to shorten visit ( not really) and she was very agreeable and understood my work in the ED. LCSW thanked her, patient reported she would enjoy a nap before her dinner.  LCSW consulted outcome with patient BMU nurse and BMU social worker- She did handle good news/bad news today and remained calm with this worker and did not mention her upset at morning med nurse or group today.  [employer] LCSW [phone]

## 2017-10-21 NOTE — Progress Notes (Signed)
[facility] MD Progress Note  10/21/2017 1:03 PM Angela [name]  MRN:  [id number]   Subjective: Pt seen and chart reviewed. She said that she felt "so much better today" because she had ha nebulizer treatment yesterday, which she always has as needed.  She said "My brain works appropriately now"  Then a few minutes later, she found the Probation officer and said "I need my nasal spray for allergy", she specifically asked for Ipratropium (Atrovent) spray.    She said that she slept better because she refused Seroquel and took Trazodone last night.  However, MAR indicated that she actually took seroquel 134m and NO trazodone was given.  She swore that she would never take seroquel again.  The writer did not argue with her and she seems to forget about it quickly and jumped to other subject.   She also stated that she has a visitor coming tonight and will bring her some brace for her wrist for Carpal Tunnel Syndrone, and wants permission to wear it.  I see no problem with it.   Otherwise, no SI or Hi. No AVH.   Principal Problem: Bipolar I disorder, most recent episode mixed, severe with psychotic features ([place] Diagnosis:   Patient Active Problem List   Diagnosis Date Noted  . Bipolar I disorder, most recent episode mixed, severe with psychotic features ([place] [F31.64] 09/18/2017  . Agitation [R45.1] 09/18/2017  . IBS (irritable bowel syndrome) [K58.9] 11/17/2016  . Bilateral carpal tunnel syndrome [G56.03] 11/17/2016  . Plantar fasciitis, bilateral [M72.2] 11/17/2016  . Chronic pain syndrome [G89.4] 11/17/2016  . Chronic hyponatremia [E87.1] 11/17/2016  . PTSD (post-traumatic stress disorder) [F43.10] 11/17/2016  . Hernia, hiatal [K44.9] 11/17/2016  . Vitamin D deficiency [E55.9] 11/17/2016  . Vitamin B12 deficiency [E53.8] 11/17/2016  . SIADH (syndrome of inappropriate ADH production) ([place] [E22.2] 09/08/2016  . Cervical spondylosis with myelopathy [M47.12] 05/24/2016  . Post-concussion headache  [G44.309] 05/24/2016  . MCI (mild cognitive impairment) with memory loss [G31.84] 03/30/2016  . Gait abnormality [R26.9] 03/30/2016  . Chronic bipolar affective disorder ([place] [F31.9] 01/24/2016  . Anemia, iron deficiency [D50.9] 06/24/2015  . Endometriosis [N80.9] 06/24/2015  . Essential hypertension [I10] 06/24/2015  . History of postmenopausal bleeding [Z87.42] 05/22/2015  . History of TIA (transient ischemic attack) [Z86.73] 04/20/2015  . Hypothyroidism [E03.9] 04/20/2015  . GERD (gastroesophageal reflux disease) [K21.9] 04/20/2015  . Cervical disc disorder with radiculopathy [M50.10] 03/25/2015  . Chronic neck pain [M54.2, G89.29] 03/25/2015  . Pelvic pain in female [R10.2] 10/30/2014  . History of skin cancer [Z85.828] 06/30/2014  . Chronic headaches [R51] 12/16/2013  . Neuropathy [G62.9] 12/16/2013   Total Time spent with patient: 20 minutes  Past Psychiatric History: Long history of chronic bipolar disorder and personality disorder and poor function  Past Medical History:  Past Medical History:  Diagnosis Date  . Allergic rhinitis   . Anemia   . Blurred vision   . Depression   . Diverticulosis   . Endometriosis   . Falls   . GERD (gastroesophageal reflux disease)   . Hernia, hiatal   . Hypothyroidism   . IBS (irritable bowel syndrome)   . Malignant neoplasm of skin   . Migraine   . Neuropathy   . PTSD (post-traumatic stress disorder)   . Thyroid disease     Past Surgical History:  Procedure Laterality Date  . APPENDECTOMY    . CHOLECYSTECTOMY    . CHOLECYSTECTOMY, LAPAROSCOPIC    . ESOPHAGOGASTRODUODENOSCOPY (EGD) WITH PROPOFOL N/A 03/29/2015  Procedure: ESOPHAGOGASTRODUODENOSCOPY (EGD) WITH PROPOFOL;  Surgeon: Josefine Class, MD;  Location: [facility] ENDOSCOPY;  Service: Endoscopy;  Laterality: N/A;  . HERNIA REPAIR    . laproscopy    . TONSILLECTOMY    . uteral suspension     Family History:  Family History  Problem Relation Age of Onset  . Heart  disease Father   . Cancer Mother        lung  . Urolithiasis Neg Hx   . Kidney disease Neg Hx   . Kidney cancer Neg Hx   . Prostate cancer Neg Hx    Family Psychiatric  History: See previous note Social History:  Social History   Substance and Sexual Activity  Alcohol Use No     Social History   Substance and Sexual Activity  Drug Use No    Social History   Socioeconomic History  . Marital status: Single    Spouse name: Not on file  . Number of children: 0  . Years of education: 1.5 years of college  . Highest education level: Not on file  Occupational History  . Occupation: Retired  Scientific laboratory technician  . Financial resource strain: Not on file  . Food insecurity:    Worry: Not on file    Inability: Not on file  . Transportation needs:    Medical: Not on file    Non-medical: Not on file  Tobacco Use  . Smoking status: Former Smoker    Last attempt to quit: 11/17/1976    Years since quitting: 40.9  . Smokeless tobacco: Never Used  Substance and Sexual Activity  . Alcohol use: No  . Drug use: No  . Sexual activity: Not Currently  Lifestyle  . Physical activity:    Days per week: Not on file    Minutes per session: Not on file  . Stress: Not on file  Relationships  . Social connections:    Talks on phone: Not on file    Gets together: Not on file    Attends religious service: Not on file    Active member of club or organization: Not on file    Attends meetings of clubs or organizations: Not on file    Relationship status: Not on file  Other Topics Concern  . Not on file  Social History Narrative   Lives at home alone.   Right-handed.   No daily caffeine use.   Additional Social History:    Pain Medications: See PTA Prescriptions: See PTA Over the Counter: See PTA History of alcohol / drug use?: No history of alcohol / drug abuse Longest period of sobriety (when/how long): Reports of none Negative Consequences of Use: (n/a) Withdrawal Symptoms:  (n/a)  Sleep: Fair  Appetite:  Fair  Current Medications: Current Facility-Administered Medications  Medication Dose Route Frequency Provider Last Rate Last Dose  . acetaminophen (TYLENOL) tablet 650 mg  650 mg Oral Q6H PRN Clapacs, Madie Reno, MD   650 mg at 10/21/17 1131  . alum & mag hydroxide-simeth (MAALOX/MYLANTA) 200-200-20 MG/5ML suspension 30 mL  30 mL Oral Q4H PRN Clapacs, John T, MD   30 mL at 10/08/17 1507  . amLODipine (NORVASC) tablet 2.5 mg  2.5 mg Oral BID Clapacs, Madie Reno, MD   2.5 mg at 10/20/17 1701  . calcium carbonate (TUMS - dosed in mg elemental calcium) chewable tablet 400 mg of elemental calcium  2 tablet Oral Q breakfast Pucilowska, Jolanta B, MD   400 mg of elemental calcium at  10/21/17 1118  . cholecalciferol (VITAMIN D) tablet 1,000 Units  1,000 Units Oral Daily Pucilowska, Jolanta B, MD   1,000 Units at 10/21/17 1119  . cycloSPORINE (RESTASIS) 0.05 % ophthalmic emulsion 1 drop  1 drop Both Eyes BID Clapacs, Madie Reno, MD   1 drop at 10/21/17 1117  . diclofenac sodium (VOLTAREN) 1 % transdermal gel 4 g  4 g Topical QID Pucilowska, Jolanta B, MD   4 g at 10/21/17 1119  . feeding supplement (BOOST / RESOURCE BREEZE) liquid 1 Container  1 Container Oral TID BM Clapacs, Madie Reno, MD   1 Container at 10/21/17 1120  . ferrous sulfate tablet 325 mg  325 mg Oral Q breakfast Pucilowska, Jolanta B, MD   325 mg at 10/21/17 1120  . gabapentin (NEURONTIN) capsule 100 mg  100 mg Oral BID Clapacs, Madie Reno, MD   100 mg at 10/21/17 1119  . glycopyrrolate (ROBINUL) injection 0.1 mg  0.1 mg Intravenous Once Clapacs, John T, MD      . hydrocortisone cream 1 %   Topical BID Clapacs, John T, MD      . ipratropium (ATROVENT) 0.03 % nasal spray 2 spray  2 spray Each Nare TID Kassadie Pancake, MD      . levalbuterol (XOPENEX) nebulizer solution 0.63 mg  0.63 mg Nebulization Q8H PRN Pucilowska, Jolanta B, MD   0.63 mg at 10/20/17 2259  . levothyroxine (SYNTHROID, LEVOTHROID) tablet 100 mcg  100 mcg Oral QAC  breakfast Pucilowska, Jolanta B, MD   100 mcg at 10/21/17 1123  . lidocaine (LIDODERM) 5 % 1 patch  1 patch Transdermal Q24H Pucilowska, Jolanta B, MD   1 patch at 10/21/17 1121  . linaclotide (LINZESS) capsule 290 mcg  290 mcg Oral QAC breakfast Pucilowska, Jolanta B, MD   290 mcg at 10/20/17 0640  . lip balm (BLISTEX) ointment   Topical PRN Clapacs, John T, MD      . loperamide (IMODIUM) capsule 2 mg  2 mg Oral PRN Pucilowska, Jolanta B, MD   2 mg at 10/18/17 0002  . LORazepam (ATIVAN) tablet 0.5 mg  0.5 mg Oral Q6H PRN Clapacs, Madie Reno, MD   0.5 mg at 10/20/17 2134  . magnesium hydroxide (MILK OF MAGNESIA) suspension 30 mL  30 mL Oral Daily PRN Clapacs, Madie Reno, MD   30 mL at 10/05/17 1731  . magnesium oxide (MAG-OX) tablet 200 mg  200 mg Oral BID Pucilowska, Jolanta B, MD   200 mg at 10/21/17 1118  . midazolam (VERSED) injection 2 mg  2 mg Intravenous Once Clapacs, John T, MD      . multivitamin with minerals tablet 1 tablet  1 tablet Oral Daily Pucilowska, Jolanta B, MD   1 tablet at 10/21/17 1121  . omega-3 acid ethyl esters (LOVAZA) capsule 1 g  1 g Oral BID Pucilowska, Jolanta B, MD   1 g at 10/21/17 1117  . ondansetron (ZOFRAN) injection 4 mg  4 mg Intravenous Once Clapacs, John T, MD      . ondansetron [facility]) injection 4 mg  4 mg Intravenous Once PRN Gunnar Fusi, MD      . ondansetron [facility]) injection 4 mg  4 mg Intravenous Once PRN Gunnar Bulla, MD      . ondansetron [facility]) injection 4 mg  4 mg Intravenous Once PRN Alvin Critchley, MD      . ondansetron [facility]) injection 4 mg  4 mg Intravenous Once Clapacs, Madie Reno, MD      .  ondansetron (ZOFRAN-ODT) disintegrating tablet 4 mg  4 mg Oral Q8H PRN Pucilowska, Jolanta B, MD   4 mg at 10/08/17 1807  . PARoxetine (PAXIL-CR) 24 hr tablet 25 mg  25 mg Oral Daily Clapacs, Madie Reno, MD   25 mg at 10/21/17 1118  . polyethylene glycol (MIRALAX / GLYCOLAX) packet 17 g  17 g Oral Daily Pucilowska, Jolanta B, MD      . polyvinyl alcohol  (LIQUIFILM TEARS) 1.4 % ophthalmic solution 1 drop  1 drop Both Eyes PRN Ramond Dial, MD   1 drop at 10/19/17 1514  . prazosin (MINIPRESS) capsule 2 mg  2 mg Oral QHS Clapacs, Madie Reno, MD   2 mg at 10/20/17 2135  . promethazine (PHENERGAN) tablet 12.5 mg  12.5 mg Oral Q6H PRN Clapacs, John T, MD   12.5 mg at 10/08/17 1647  . pyridOXINE (VITAMIN B-6) tablet 100 mg  100 mg Oral BID Clapacs, Madie Reno, MD   100 mg at 10/21/17 1118  . QUEtiapine (SEROQUEL) tablet 100 mg  100 mg Oral QHS Maryfer Tauzin, MD   100 mg at 10/20/17 2134  . tiZANidine (ZANAFLEX) tablet 2 mg  2 mg Oral Q8H PRN Pucilowska, Jolanta B, MD   2 mg at 10/19/17 2236  . traZODone (DESYREL) tablet 150 mg  150 mg Oral QHS Chauncey Mann, MD   150 mg at 10/20/17 2134  . triamcinolone cream (KENALOG) 0.1 %   Topical TID PRN Ramond Dial, MD   1 application at 67/67/20 2106  . vitamin C (ASCORBIC ACID) tablet 1,000 mg  1,000 mg Oral Daily Pucilowska, Jolanta B, MD   1,000 mg at 10/21/17 1118    Lab Results:  No results found for this or any previous visit (from the past 48 hour(s)).  Blood Alcohol level:  Lab Results  Component Value Date   ETH <5 09/29/2016   ETH <5 94/70/9628    Metabolic Disorder Labs: Lab Results  Component Value Date   HGBA1C 5.7 (H) 09/20/2017   MPG 116.89 09/20/2017   No results found for: PROLACTIN Lab Results  Component Value Date   CHOL 173 09/20/2017   TRIG 193 (H) 09/20/2017   HDL 50 09/20/2017   CHOLHDL 3.5 09/20/2017   VLDL 39 09/20/2017   LDLCALC 84 09/20/2017   LDLCALC 87 09/04/2017    Physical Findings: AIMS: Facial and Oral Movements Muscles of Facial Expression: None, normal Lips and Perioral Area: None, normal Jaw: None, normal Tongue: None, normal,Extremity Movements Upper (arms, wrists, hands, fingers): None, normal Lower (legs, knees, ankles, toes): None, normal, Trunk Movements Neck, shoulders, hips: None, normal, Overall Severity Severity of abnormal movements  (highest score from questions above): None, normal Incapacitation due to abnormal movements: None, normal Patient's awareness of abnormal movements (rate only patient's report): No Awareness, Dental Status Current problems with teeth and/or dentures?: No Does patient usually wear dentures?: No  CIWA:    COWS:     Musculoskeletal: Strength & Muscle Tone: within normal limits Gait & Station: normal Patient leans: N/A  Psychiatric Specialty Exam: Physical Exam  Nursing note and vitals reviewed. Constitutional: She appears well-developed and well-nourished.  HENT:  Head: Normocephalic and atraumatic.  Eyes: Pupils are equal, round, and reactive to light. Conjunctivae are normal.  Neck: Normal range of motion.  Cardiovascular: Regular rhythm and normal heart sounds.  Respiratory: Effort normal. No respiratory distress.  GI: Soft.  Musculoskeletal: Normal range of motion.  Neurological: She is alert.  Skin: Skin is warm  and dry.  Psychiatric: Her affect is not blunt. She is agitated. She is not aggressive. Thought content is not paranoid. She expresses impulsivity. She exhibits a depressed mood. She expresses no homicidal and no suicidal ideation. She exhibits abnormal recent memory.    Review of Systems  Constitutional: Negative.   HENT: Negative.   Eyes: Negative.   Respiratory: Negative.   Cardiovascular: Negative.   Gastrointestinal: Negative.   Musculoskeletal: Negative.   Skin: Negative.   Neurological: Negative.   Psychiatric/Behavioral: Positive for depression and memory loss. Negative for hallucinations, substance abuse and suicidal ideas. The patient is nervous/anxious and has insomnia.     Blood pressure (!) 117/58, pulse (!) 57, temperature 97.8 F (36.6 C), temperature source Oral, resp. rate 18, height 5' 3" (1.6 m), weight 53.1 kg, SpO2 95 %.Body mass index is 20.73 kg/m.  General Appearance: Casual  Eye Contact:  Fair  Speech:  Clear and Coherent  Volume:   Decreased  Mood:  Dysphoric and Irritable  Affect:  Congruent  Thought Process:  Goal Directed  Orientation:  Full (Time, Place, and Person)  Thought Content:  Rumination and Tangential  Suicidal Thoughts:  No  Homicidal Thoughts:  No  Memory:  Immediate;   Fair Recent;   Fair Remote;   Fair  Judgement:  Impaired  Insight:  Shallow  Psychomotor Activity:  Decreased  Concentration:  Concentration: Poor  Recall:  [name]:  Fair  Language:  Fair        Assets:  Desire for Improvement  ADL's:  Intact  Cognition:  Impaired,  Mild  Sleep:  Number of Hours: 3     Treatment Plan Summary: Daily contact with patient to assess and evaluate symptoms and progress in treatment, Medication management and Plan ECT without difficulty.  Slept a little better last night.  As usual when Angela [name] stopped to talk with her one-on-one she has a large number of complaints but left to her own devices she is less disorganized and more appropriate.  Treatment team met today and discussed discharge planning.  It will be difficult finding appropriate disposition for her so we will have to start looking for places.  Asked the patient to please try and be involved with that.  For now continue current medication.   Plan: # Bipolar Affective disorder -- continue ECT -- continue paxil 1m daily, watching for anticholinergic side effects.  Her Na is 140 on 10/15/17. Pt claimed that she had SIADH.  -- reduce seroquel from 1547mto 10062mhs, as pt threatening to stop taking it.  -- no SI or HI, no AVH.    # PTSD: -- continue Prazosin 2mg60ms.  -- recommend trauma therapy as outpatient   # Rash -- continue Triamcinolone cream.  -- no indication for Permethrin for scabies at this time.  Will continue monitor.   # HTN -- continue Norvasc 2.5mg 29mBID, however pt's BP is WNL -- defer the primary team for indication of whether to continue ECT.   #  Hypothyroidism -- continue Synthroid 100mcg54mily and TSH is 0.95, WNL on 09/20/17  # SOB: subjective complaint -- continue PRN Xopenex nebulizer -- added Ipratropium nasal spray per her request.    Dispo: -- defer to primary team.    Morgyn Marut, MD 10/21/2017, 1:03 PM

## 2017-10-22 ENCOUNTER — Inpatient Hospital Stay: Payer: Medicare Other | Admitting: Anesthesiology

## 2017-10-22 ENCOUNTER — Inpatient Hospital Stay: Payer: Medicare Other

## 2017-10-22 LAB — GLUCOSE, CAPILLARY: Glucose-Capillary: 83 mg/dL (ref 70–99)

## 2017-10-22 MED ORDER — SODIUM CHLORIDE 0.9 % IV SOLN
INTRAVENOUS | Status: DC | PRN
  Administered 2017-10-22: 09:00:00 via INTRAVENOUS

## 2017-10-22 MED ORDER — KETAMINE HCL 50 MG/ML IJ SOLN
INTRAMUSCULAR | Status: AC
  Filled 2017-10-22: qty 10

## 2017-10-22 MED ORDER — KETAMINE HCL 50 MG/ML IJ SOLN
INTRAMUSCULAR | Status: DC | PRN
  Administered 2017-10-22: 50 mg via INTRAMUSCULAR

## 2017-10-22 MED ORDER — KETOROLAC TROMETHAMINE 30 MG/ML IJ SOLN
INTRAMUSCULAR | Status: AC
  Administered 2017-10-22: 30 mg via INTRAVENOUS
  Filled 2017-10-22: qty 1

## 2017-10-22 MED ORDER — SUCCINYLCHOLINE CHLORIDE 200 MG/10ML IV SOSY
PREFILLED_SYRINGE | INTRAVENOUS | Status: DC | PRN
  Administered 2017-10-22: 80 mg via INTRAVENOUS

## 2017-10-22 MED ORDER — ALBUTEROL SULFATE (2.5 MG/3ML) 0.083% IN NEBU
2.5000 mg | INHALATION_SOLUTION | Freq: Four times a day (QID) | RESPIRATORY_TRACT | Status: DC
  Administered 2017-10-22: 2.5 mg via RESPIRATORY_TRACT
  Filled 2017-10-22: qty 3

## 2017-10-22 MED ORDER — AMLODIPINE BESYLATE 5 MG PO TABS
5.0000 mg | ORAL_TABLET | Freq: Two times a day (BID) | ORAL | Status: DC
  Administered 2017-10-23 – 2017-11-06 (×24): 5 mg via ORAL
  Filled 2017-10-22 (×26): qty 1

## 2017-10-22 MED ORDER — KETOROLAC TROMETHAMINE 30 MG/ML IJ SOLN
30.0000 mg | Freq: Once | INTRAMUSCULAR | Status: AC
  Administered 2017-10-22: 30 mg via INTRAVENOUS
  Filled 2017-10-22: qty 1

## 2017-10-22 MED ORDER — SODIUM CHLORIDE 0.9 % IV SOLN
500.0000 mL | Freq: Once | INTRAVENOUS | Status: AC
  Administered 2017-10-22: 500 mL via INTRAVENOUS

## 2017-10-22 MED ORDER — KETAMINE HCL 10 MG/ML IJ SOLN: INTRAMUSCULAR | Status: DC | PRN

## 2017-10-22 MED ORDER — LEVALBUTEROL HCL 0.63 MG/3ML IN NEBU
0.6300 mg | INHALATION_SOLUTION | Freq: Every day | RESPIRATORY_TRACT | Status: DC
  Administered 2017-10-23 – 2017-11-06 (×11): 0.63 mg via RESPIRATORY_TRACT
  Filled 2017-10-22 (×15): qty 3

## 2017-10-22 NOTE — Progress Notes (Signed)
[facility] MD Progress Note  10/22/2017 5:20 PM Angela [name]  MRN:  [id number] Subjective: Patient continues to report feeling unhappy all the time.  Multiple complaints about seemingly trivial items of how people are addressing her.  Refuses to believe that her mood lability could be bipolar disorder despite the obvious evidence of her lifelong mental health problems.  Tells me today that the only thing that is going to make her better is if I adjust her blood pressure medicine correctly and give her nebulizers. Principal Problem: Bipolar I disorder, most recent episode mixed, severe with psychotic features ([place]) Diagnosis:   Patient Active Problem List   Diagnosis Date Noted  . Bipolar I disorder, most recent episode mixed, severe with psychotic features ([place]) [F31.64] 09/18/2017  . Agitation [R45.1] 09/18/2017  . IBS (irritable bowel syndrome) [K58.9] 11/17/2016  . Bilateral carpal tunnel syndrome [G56.03] 11/17/2016  . Plantar fasciitis, bilateral [M72.2] 11/17/2016  . Chronic pain syndrome [G89.4] 11/17/2016  . Chronic hyponatremia [E87.1] 11/17/2016  . PTSD (post-traumatic stress disorder) [F43.10] 11/17/2016  . Hernia, hiatal [K44.9] 11/17/2016  . Vitamin D deficiency [E55.9] 11/17/2016  . Vitamin B12 deficiency [E53.8] 11/17/2016  . SIADH (syndrome of inappropriate ADH production) ([place]) [E22.2] 09/08/2016  . Cervical spondylosis with myelopathy [M47.12] 05/24/2016  . Post-concussion headache [G44.309] 05/24/2016  . MCI (mild cognitive impairment) with memory loss [G31.84] 03/30/2016  . Gait abnormality [R26.9] 03/30/2016  . Chronic bipolar affective disorder ([place]) [F31.9] 01/24/2016  . Anemia, iron deficiency [D50.9] 06/24/2015  . Endometriosis [N80.9] 06/24/2015  . Essential hypertension [I10] 06/24/2015  . History of postmenopausal bleeding [Z87.42] 05/22/2015  . History of TIA (transient ischemic attack) [Z86.73] 04/20/2015  . Hypothyroidism [E03.9] 04/20/2015  . GERD  (gastroesophageal reflux disease) [K21.9] 04/20/2015  . Cervical disc disorder with radiculopathy [M50.10] 03/25/2015  . Chronic neck pain [M54.2, G89.29] 03/25/2015  . Pelvic pain in female [R10.2] 10/30/2014  . History of skin cancer [Z85.828] 06/30/2014  . Chronic headaches [R51] 12/16/2013  . Neuropathy [G62.9] 12/16/2013   Total Time spent with patient: 30 minutes  Past Psychiatric History: Long-standing mood instability noncompliance long history of poor outcomes  Past Medical History:  Past Medical History:  Diagnosis Date  . Allergic rhinitis   . Anemia   . Blurred vision   . Depression   . Diverticulosis   . Endometriosis   . Falls   . GERD (gastroesophageal reflux disease)   . Hernia, hiatal   . Hypothyroidism   . IBS (irritable bowel syndrome)   . Malignant neoplasm of skin   . Migraine   . Neuropathy   . PTSD (post-traumatic stress disorder)   . Thyroid disease     Past Surgical History:  Procedure Laterality Date  . APPENDECTOMY    . CHOLECYSTECTOMY    . CHOLECYSTECTOMY, LAPAROSCOPIC    . ESOPHAGOGASTRODUODENOSCOPY (EGD) WITH PROPOFOL N/A 03/29/2015   Procedure: ESOPHAGOGASTRODUODENOSCOPY (EGD) WITH PROPOFOL;  Surgeon: Josefine Class, MD;  Location: [facility] ENDOSCOPY;  Service: Endoscopy;  Laterality: N/A;  . HERNIA REPAIR    . laproscopy    . TONSILLECTOMY    . uteral suspension     Family History:  Family History  Problem Relation Age of Onset  . Heart disease Father   . Cancer Mother        lung  . Urolithiasis Neg Hx   . Kidney disease Neg Hx   . Kidney cancer Neg Hx   . Prostate cancer Neg Hx    Family Psychiatric  History:  None Social History:  Social History   Substance and Sexual Activity  Alcohol Use No     Social History   Substance and Sexual Activity  Drug Use No    Social History   Socioeconomic History  . Marital status: Single    Spouse name: Not on file  . Number of children: 0  . Years of education: 1.5 years of  college  . Highest education level: Not on file  Occupational History  . Occupation: Retired  Scientific laboratory technician  . Financial resource strain: Not on file  . Food insecurity:    Worry: Not on file    Inability: Not on file  . Transportation needs:    Medical: Not on file    Non-medical: Not on file  Tobacco Use  . Smoking status: Former Smoker    Last attempt to quit: 11/17/1976    Years since quitting: 40.9  . Smokeless tobacco: Never Used  Substance and Sexual Activity  . Alcohol use: No  . Drug use: No  . Sexual activity: Not Currently  Lifestyle  . Physical activity:    Days per week: Not on file    Minutes per session: Not on file  . Stress: Not on file  Relationships  . Social connections:    Talks on phone: Not on file    Gets together: Not on file    Attends religious service: Not on file    Active member of club or organization: Not on file    Attends meetings of clubs or organizations: Not on file    Relationship status: Not on file  Other Topics Concern  . Not on file  Social History Narrative   Lives at home alone.   Right-handed.   No daily caffeine use.   Additional Social History:    Pain Medications: See PTA Prescriptions: See PTA Over the Counter: See PTA History of alcohol / drug use?: No history of alcohol / drug abuse Longest period of sobriety (when/how long): Reports of none Negative Consequences of Use: (n/a) Withdrawal Symptoms: (n/a)                    Sleep: Fair  Appetite:  Fair  Current Medications: Current Facility-Administered Medications  Medication Dose Route Frequency Provider Last Rate Last Dose  . acetaminophen (TYLENOL) tablet 650 mg  650 mg Oral Q6H PRN Cheetara Hoge, Madie Reno, MD   650 mg at 10/21/17 1959  . alum & mag hydroxide-simeth (MAALOX/MYLANTA) 200-200-20 MG/5ML suspension 30 mL  30 mL Oral Q4H PRN Happy Begeman T, MD   30 mL at 10/08/17 1507  . amLODipine (NORVASC) tablet 2.5 mg  2.5 mg Oral BID Alamin Mccuiston T, MD    2.5 mg at 10/22/17 1503  . calcium carbonate (TUMS - dosed in mg elemental calcium) chewable tablet 400 mg of elemental calcium  2 tablet Oral Q breakfast Pucilowska, Jolanta B, MD   400 mg of elemental calcium at 10/22/17 1510  . cholecalciferol (VITAMIN D) tablet 1,000 Units  1,000 Units Oral Daily Pucilowska, Jolanta B, MD   1,000 Units at 10/22/17 1503  . cycloSPORINE (RESTASIS) 0.05 % ophthalmic emulsion 1 drop  1 drop Both Eyes BID Vanden Fawaz, Madie Reno, MD   1 drop at 10/22/17 0806  . diclofenac sodium (VOLTAREN) 1 % transdermal gel 4 g  4 g Topical QID Pucilowska, Jolanta B, MD   4 g at 10/22/17 1508  . feeding supplement (BOOST / RESOURCE BREEZE) liquid 1 Container  1 Container Oral TID BM Cohen Doleman, Madie Reno, MD   1 Container at 10/22/17 1549  . ferrous sulfate tablet 325 mg  325 mg Oral Q breakfast Pucilowska, Jolanta B, MD   325 mg at 10/21/17 1120  . gabapentin (NEURONTIN) capsule 100 mg  100 mg Oral BID Zayda Angell, Madie Reno, MD   100 mg at 10/22/17 1512  . glycopyrrolate (ROBINUL) injection 0.1 mg  0.1 mg Intravenous Once Angely Dietz T, MD      . hydrocortisone cream 1 %   Topical BID Elye Harmsen T, MD      . ipratropium (ATROVENT) 0.03 % nasal spray 2 spray  2 spray Each Nare TID He, Jun, MD   2 spray at 10/22/17 1515  . levalbuterol (XOPENEX) nebulizer solution 0.63 mg  0.63 mg Nebulization Q8H PRN Pucilowska, Jolanta B, MD   0.63 mg at 10/20/17 2259  . levothyroxine (SYNTHROID, LEVOTHROID) tablet 100 mcg  100 mcg Oral QAC breakfast Pucilowska, Jolanta B, MD   100 mcg at 10/22/17 1508  . lidocaine (LIDODERM) 5 % 1 patch  1 patch Transdermal Q24H Pucilowska, Jolanta B, MD   1 patch at 10/21/17 1121  . linaclotide (LINZESS) capsule 290 mcg  290 mcg Oral QAC breakfast Pucilowska, Jolanta B, MD   290 mcg at 10/20/17 0640  . lip balm (BLISTEX) ointment   Topical PRN Sevan Mcbroom T, MD      . loperamide (IMODIUM) capsule 2 mg  2 mg Oral PRN Pucilowska, Jolanta B, MD   2 mg at 10/18/17 0002  .  LORazepam (ATIVAN) tablet 0.5 mg  0.5 mg Oral Q6H PRN Kouper Spinella, Madie Reno, MD   0.5 mg at 10/20/17 2134  . magnesium hydroxide (MILK OF MAGNESIA) suspension 30 mL  30 mL Oral Daily PRN Kailea Dannemiller, Madie Reno, MD   30 mL at 10/05/17 1731  . magnesium oxide (MAG-OX) tablet 200 mg  200 mg Oral BID Pucilowska, Jolanta B, MD   200 mg at 10/22/17 1506  . midazolam (VERSED) injection 2 mg  2 mg Intravenous Once Yasin Ducat T, MD      . multivitamin with minerals tablet 1 tablet  1 tablet Oral Daily Pucilowska, Jolanta B, MD   1 tablet at 10/22/17 1503  . omega-3 acid ethyl esters (LOVAZA) capsule 1 g  1 g Oral BID Pucilowska, Jolanta B, MD   1 g at 10/22/17 1507  . ondansetron (ZOFRAN) injection 4 mg  4 mg Intravenous Once Osman Calzadilla T, MD      . ondansetron [facility]) injection 4 mg  4 mg Intravenous Once PRN Gunnar Fusi, MD      . ondansetron [facility]) injection 4 mg  4 mg Intravenous Once PRN Gunnar Bulla, MD      . ondansetron [facility]) injection 4 mg  4 mg Intravenous Once PRN Alvin Critchley, MD      . ondansetron [facility]) injection 4 mg  4 mg Intravenous Once Marris Frontera T, MD      . ondansetron (ZOFRAN-ODT) disintegrating tablet 4 mg  4 mg Oral Q8H PRN Pucilowska, Jolanta B, MD   4 mg at 10/08/17 1807  . PARoxetine (PAXIL-CR) 24 hr tablet 25 mg  25 mg Oral Daily [place] Wainwright, Madie Reno, MD   25 mg at 10/22/17 1518  . polyethylene glycol (MIRALAX / GLYCOLAX) packet 17 g  17 g Oral Daily Pucilowska, Jolanta B, MD   17 g at 10/22/17 1503  . polyvinyl alcohol (LIQUIFILM TEARS) 1.4 % ophthalmic solution 1  drop  1 drop Both Eyes PRN Ramond Dial, MD   1 drop at 10/22/17 1505  . prazosin (MINIPRESS) capsule 2 mg  2 mg Oral QHS Aliciana Ricciardi, Madie Reno, MD   2 mg at 10/20/17 2135  . promethazine (PHENERGAN) tablet 12.5 mg  12.5 mg Oral Q6H PRN Zuha Dejonge T, MD   12.5 mg at 10/22/17 1443  . pyridOXINE (VITAMIN B-6) tablet 100 mg  100 mg Oral BID Kela Baccari T, MD   100 mg at 10/22/17 1506  . QUEtiapine (SEROQUEL)  tablet 100 mg  100 mg Oral QHS He, Jun, MD   100 mg at 10/20/17 2134  . tiZANidine (ZANAFLEX) tablet 2 mg  2 mg Oral Q8H PRN Pucilowska, Jolanta B, MD   2 mg at 10/21/17 1600  . traZODone (DESYREL) tablet 150 mg  150 mg Oral QHS Chauncey Mann, MD   150 mg at 10/20/17 2134  . triamcinolone cream (KENALOG) 0.1 %   Topical TID PRN Ramond Dial, MD   1 application at 36/64/40 2106  . vitamin C (ASCORBIC ACID) tablet 1,000 mg  1,000 mg Oral Daily Pucilowska, Jolanta B, MD   1,000 mg at 10/22/17 1517    Lab Results:  Results for orders placed or performed during the hospital encounter of 09/19/17 (from the past 48 hour(s))  Glucose, capillary     Status: None   Collection Time: 10/22/17  7:10 AM  Result Value Ref Range   Glucose-Capillary 83 70 - 99 mg/dL   Comment 1 Notify RN     Blood Alcohol level:  Lab Results  Component Value Date   ETH <5 09/29/2016   ETH <5 34/74/2595    Metabolic Disorder Labs: Lab Results  Component Value Date   HGBA1C 5.7 (H) 09/20/2017   MPG 116.89 09/20/2017   No results found for: PROLACTIN Lab Results  Component Value Date   CHOL 173 09/20/2017   TRIG 193 (H) 09/20/2017   HDL 50 09/20/2017   CHOLHDL 3.5 09/20/2017   VLDL 39 09/20/2017   LDLCALC 84 09/20/2017   LDLCALC 87 09/04/2017    Physical Findings: AIMS: Facial and Oral Movements Muscles of Facial Expression: None, normal Lips and Perioral Area: None, normal Jaw: None, normal Tongue: None, normal,Extremity Movements Upper (arms, wrists, hands, fingers): None, normal Lower (legs, knees, ankles, toes): None, normal, Trunk Movements Neck, shoulders, hips: None, normal, Overall Severity Severity of abnormal movements (highest score from questions above): None, normal Incapacitation due to abnormal movements: None, normal Patient's awareness of abnormal movements (rate only patient's report): No Awareness, Dental Status Current problems with teeth and/or dentures?: No Does patient  usually wear dentures?: No  CIWA:    COWS:     Musculoskeletal: Strength & Muscle Tone: within normal limits Gait & Station: normal Patient leans: N/A  Psychiatric Specialty Exam: Physical Exam  Nursing note and vitals reviewed. Constitutional: She appears well-developed and well-nourished.  HENT:  Head: Normocephalic and atraumatic.  Eyes: Pupils are equal, round, and reactive to light. Conjunctivae are normal.  Neck: Normal range of motion.  Cardiovascular: Regular rhythm and normal heart sounds.  Respiratory: Effort normal. No respiratory distress.  GI: Soft.  Musculoskeletal: Normal range of motion.  Neurological: She is alert.  Skin: Skin is warm and dry.  Psychiatric: Her speech is normal. Her mood appears anxious. She is agitated. Thought content is paranoid. Cognition and memory are impaired. She expresses impulsivity.    Review of Systems  Constitutional: Negative.   HENT: Negative.  Eyes: Negative.   Respiratory: Negative.   Cardiovascular: Negative.   Gastrointestinal: Negative.   Musculoskeletal: Negative.   Skin: Negative.   Neurological: Negative.   Psychiatric/Behavioral: Positive for depression. Negative for hallucinations, memory loss, substance abuse and suicidal ideas. The patient is nervous/anxious and has insomnia.     Blood pressure (!) 162/88, pulse 72, temperature 98.4 F (36.9 C), temperature source Oral, resp. rate 18, height 5\' 3"  (1.6 m), weight 53.1 kg, SpO2 91 %.Body mass index is 20.73 kg/m.  General Appearance: Casual  Eye Contact:  Good  Speech:  Pressured  Volume:  Increased  Mood:  Anxious, Depressed and Irritable  Affect:  Congruent  Thought Process:  Coherent  Orientation:  Full (Time, Place, and Person)  Thought Content:  Illogical and Rumination  Suicidal Thoughts:  No  Homicidal Thoughts:  No  Memory:  Immediate;   Fair Recent;   Fair Remote;   Fair  Judgement:  Impaired  Insight:  Shallow  Psychomotor Activity:   Decreased  Concentration:  Concentration: Fair  Recall:  [name]:  Fair  Language:  Fair  Akathisia:  No  Handed:  Right  AIMS (if indicated):     Assets:  Desire for Improvement  ADL's:  Impaired  Cognition:  Impaired,  Mild  Sleep:  Number of Hours: 5.45     Treatment Plan Summary: Plan Very difficult.  Very difficult to communicate with her or even agree on what the treatment goals might be.  I still think the ECT has been of some benefit and we will continue on Wednesday which she is agreeable to.  She is refusing to take any Seroquel because she thinks that it caused some kind of dramatically horrible side effect of which I am very dubious.  I tried to convince her that she needs to be on mood stabilizing medicine and all this resulted in was a pointless argument.  Alethia Berthold, MD 10/22/2017, 5:20 PM

## 2017-10-22 NOTE — Anesthesia Preprocedure Evaluation (Signed)
Anesthesia Evaluation  Patient identified by MRN, date of birth, ID band Patient awake    Reviewed: Allergy & Precautions, NPO status , Patient's Chart, lab work & pertinent test results  History of Anesthesia Complications Negative for: history of anesthetic complications  Airway Mallampati: II  TM Distance: >3 FB Neck ROM: Full    Dental  (+) Poor Dentition   Pulmonary neg sleep apnea, neg COPD, former smoker,    breath sounds clear to auscultation- rhonchi (-) wheezing      Cardiovascular hypertension, (-) CAD, (-) Past MI, (-) Cardiac Stents and (-) CABG  Rhythm:Regular Rate:Normal - Systolic murmurs and - Diastolic murmurs    Neuro/Psych  Headaches, PSYCHIATRIC DISORDERS Anxiety Depression Bipolar Disorder    GI/Hepatic Neg liver ROS, hiatal hernia, GERD  ,  Endo/Other  neg diabetesHypothyroidism   Renal/GU negative Renal ROS     Musculoskeletal negative musculoskeletal ROS (+)   Abdominal (+) - obese,   Peds  Hematology  (+) anemia ,   Anesthesia Other Findings Past Medical History: No date: Allergic rhinitis No date: Anemia No date: Blurred vision No date: Depression No date: Diverticulosis No date: Endometriosis No date: Falls No date: GERD (gastroesophageal reflux disease) No date: Hernia, hiatal No date: Hypothyroidism No date: IBS (irritable bowel syndrome) No date: Malignant neoplasm of skin No date: Migraine No date: Neuropathy No date: PTSD (post-traumatic stress disorder) No date: Thyroid disease   Reproductive/Obstetrics                             Anesthesia Physical  Anesthesia Plan  ASA: II  Anesthesia Plan: General   Post-op Pain Management:    Induction: Intravenous  PONV Risk Score and Plan: 2 and Promethazine  Airway Management Planned: Mask  Additional Equipment:   Intra-op Plan:   Post-operative Plan:   Informed Consent: I have reviewed  the patients History and Physical, chart, labs and discussed the procedure including the risks, benefits and alternatives for the proposed anesthesia with the patient or authorized representative who has indicated his/her understanding and acceptance.   Dental advisory given  Plan Discussed with: CRNA and Anesthesiologist  Anesthesia Plan Comments:         Anesthesia Quick Evaluation

## 2017-10-22 NOTE — Procedures (Signed)
ECT SERVICES Physician's Interval Evaluation & Treatment Note  Patient Identification: Zarianna Dicarlo MRN:  [id number] Date of Evaluation:  10/22/2017 TX #: [id number]  MADRS:   MMSE:   P.E. Findings:  No change to physical exam  Psychiatric Interval Note:  Continues to be irritable and labile.  Not obviously psychotic.  Argumentative very somatic  Subjective:  Patient is a 67 y.o. female seen for evaluation for Electroconvulsive Therapy. Lots of not very rational demands.  Treatment Summary:   [x]   Right Unilateral             []  Bilateral   % Energy : 0.3 ms 100%   Impedance: 1520 ohms  Seizure Energy Index: 8947 V squared  Postictal Suppression Index: 90%  Seizure Concordance Index: 93%  Medications  Pre Shock: Toradol 30 mg ketamine 50 mg succinylcholine 80 mg  Post Shock:    Seizure Duration: 36 seconds by EMG 64 seconds by EEG   Comments: We will follow-up on Wednesday  Lungs:  [x]   Clear to auscultation               []  Other:   Heart:    [x]   Regular rhythm             []  irregular rhythm    [x]   Previous H&P reviewed, patient examined and there are NO CHANGES                 []   Previous H&P reviewed, patient examined and there are changes noted.   Alethia Berthold, MD 10/7/20195:17 PM

## 2017-10-22 NOTE — Plan of Care (Signed)
Patient is not happy with the care she is getting here.Patient had ECT today.Patient slept till 3pm after ECT.Breathing treatment given as per request.After the treatment patient stated that she is allergic to albuterol.No SOB noted.VS stable.Patient ambulates with walker.

## 2017-10-22 NOTE — Anesthesia Postprocedure Evaluation (Signed)
Anesthesia Post Note  Patient: Angela [name]  Procedure(s) Performed: ECT TX  Patient location during evaluation: PACU Anesthesia Type: General Level of consciousness: awake and alert Pain management: pain level controlled Vital Signs Assessment: post-procedure vital signs reviewed and stable Respiratory status: spontaneous breathing, nonlabored ventilation and respiratory function stable Cardiovascular status: blood pressure returned to baseline and stable Postop Assessment: no signs of nausea or vomiting Anesthetic complications: no     Last Vitals:  Vitals:   10/22/17 1105 10/22/17 1115  BP: (!) 171/95 (!) 159/91  Pulse: 74 68  Resp: (!) 21 17  Temp:  36.8 C  SpO2: 95% 91%    Last Pain:  Vitals:   10/22/17 1115  TempSrc:   PainSc: 0-No pain                 [name]

## 2017-10-22 NOTE — Anesthesia Post-op Follow-up Note (Signed)
Anesthesia QCDR form completed.        

## 2017-10-22 NOTE — Plan of Care (Signed)
Pt. Able to disclose and denies si/hi, verbally contracts for safety. Pt. Able to remain safe while on the unit. Pt. Continues to refuse medications and/or also not take them at scheduled timings. Pt. Continues to present with a very negative attitude and irritability.   Problem: Self-Concept: Goal: Ability to disclose and discuss suicidal ideas will improve Outcome: Progressing   Problem: Safety: Goal: Ability to remain free from injury will improve Outcome: Progressing   Problem: Education: Goal: Ability to make informed decisions regarding treatment will improve Outcome: Not Progressing   Problem: Coping: Goal: Coping ability will improve Outcome: Not Progressing   Problem: Health Behavior/Discharge Planning: Goal: Identification of resources available to assist in meeting health care needs will improve Outcome: Not Progressing   Problem: Medication: Goal: Compliance with prescribed medication regimen will improve Outcome: Not Progressing   Problem: Self-Concept: Goal: Will verbalize positive feelings about self Outcome: Not Progressing   Problem: Spiritual Needs Goal: Ability to function at adequate level Outcome: Not Progressing

## 2017-10-22 NOTE — [facility] Group Notes (Signed)
[place] [facility] Group Notes:  (Nursing/MHT/Case Management/Adjunct)  Date:  10/22/2017  Time:  9:29 PM  Type of Therapy:  Group Therapy  Participation Level:  Active  Participation Quality:  Attentive  Affect:  Irritable  Cognitive:  Alert  Insight:  Appropriate  Engagement in Group:  Engaged  Modes of Intervention:  Discussion  Summary of Progress/Problems: Angela [name] reported she was frustrated with the care she was receiving.  Angela [name] reports she feels no one is listening to her. Angela [name] completed her self-inventory sheet. Angela [name] reports her depression reduced from a 10 to a 6. Reports high anxiety. Denies harmful ideation to self and others. Reports high level of pain. Reports extreme frustration with being on the unit. MHT reviewed rules and expectations of the unit. MHT processed with patients about making routine checks throughout the night. MHT encouraged patients to complete self-inventory sheet. MHT processed with patients about seeking outpatient treatment when discharged from the unit. MHT explained the expected benefits of working with a mental health provider in the community. MHT encouraged patients to comply with doctor recommendations and follow up appointments.  [name] 10/22/2017, 9:29 PM

## 2017-10-22 NOTE — Progress Notes (Signed)
D: Pt denies SI/HI/AVH, will contract for safety. Pt. Continues to present with multiple somatic complaints and labile mood. Pt. Continues to be verbally aggressive with staff, yelling at this writer and other nursing staff when demands are not met immediately or unable to be met. Pt. Continues to utilize staff splitting behavior reporting to one nurse for something then asking another nurse when she does not receive the answer she would like. Pt. Educated this behavior is not appropriate. Pt. When attempting to staff split and was unsuccessful proceeded to scream in the nurses face and posture at the RN. Pt. Redirectable with some encouragement to her room. Pt. After yelling at staff frequently returns to her room this evening slamming her door.    A: Q x 15 minute observation checks were completed for safety. Patient was provided with education, but refuses.  Patient was given/offered medications per orders. Patient  was encourage to attend groups, participate in unit activities and continue with plan of care. Pt. Chart and plans of care reviewed. Pt. Given support and encouragement. Pt. Attempted to be given falls risk education, but refused.Pt. Npo maintained for ECT. Pt. Blood sugars to be monitored pre-ect protocol.    R: Patient is not complaint with medications. Pt. Refuses medications and is only partially complaint with some of her medications. Pt. Continues to refuse to take medications at the appropriately scheduled timings and demands she can take more or less of the prescribed medications that have been placed for her. Pt. Educated she is not allowed to take more or less of her prescribed medications. Pt. After being educated on medication administration and appropriate dosing, again rushes to her room and slams her door stating, "okay, I see, we're done here!".            Precautionary checks every 15 minutes for safety maintained, room free of safety hazards, patient sustains no injury or  falls during this shift. Will endorse care to next shift.

## 2017-10-22 NOTE — Transfer of Care (Signed)
Immediate Anesthesia Transfer of Care Note  Patient: Angela [name]  Procedure(s) Performed: ECT TX  Patient Location: PACU  Anesthesia Type:General  Level of Consciousness: sedated  Airway & Oxygen Therapy: Patient Spontanous Breathing and Patient connected to face mask oxygen  Post-op Assessment: Report given to RN and Post -op Vital signs reviewed and stable  Post vital signs: Reviewed and stable  Last Vitals:  Vitals Value Taken Time  BP 162/87 10/22/2017 10:45 AM  Temp 36.8 C 10/22/2017 10:45 AM  Pulse 75 10/22/2017 10:47 AM  Resp 15 10/22/2017 10:47 AM  SpO2 100 % 10/22/2017 10:47 AM  Vitals shown include unvalidated device data.  Last Pain:  Vitals:   10/22/17 1045  TempSrc:   PainSc: 0-No pain      Patients Stated Pain Goal: 0 (16/58/00 6349)  Complications: No apparent anesthesia complications

## 2017-10-22 NOTE — Progress Notes (Signed)
Recreation Therapy Notes  Date: 10/22/2017  Time: 9:30 am  Location: Craft Room  Behavioral response: Appropriate   Intervention Topic: Self-Esteem  Discussion/Intervention:  Group content today was focused on self-esteem. Patient defined self-esteem and where it comes form. The group described reasons self-esteem is important. Individuals stated things that impact self-esteem and positive ways to improve self-esteem. The group participated in the intervention "Collage of Me" where patients were able to create a collage of positive things that makes them who they are. Clinical Observations/Feedback:  Patient came to group but was pulled from group for ECT. Treniya Lobb LRT/CTRS          Josselin Gaulin 10/22/2017 12:05 PM

## 2017-10-22 NOTE — H&P (Signed)
Angela [name] is an 67 y.o. female.   Chief Complaint: "I am terrible" HPI: Patient seen chart reviewed.  Patient with chronic mood instability here to receive ECT.  Mood is about the same may be a little more irritable today.  As usual multiple somatic complaints and irrational demands  Past Medical History:  Diagnosis Date  . Allergic rhinitis   . Anemia   . Blurred vision   . Depression   . Diverticulosis   . Endometriosis   . Falls   . GERD (gastroesophageal reflux disease)   . Hernia, hiatal   . Hypothyroidism   . IBS (irritable bowel syndrome)   . Malignant neoplasm of skin   . Migraine   . Neuropathy   . PTSD (post-traumatic stress disorder)   . Thyroid disease     Past Surgical History:  Procedure Laterality Date  . APPENDECTOMY    . CHOLECYSTECTOMY    . CHOLECYSTECTOMY, LAPAROSCOPIC    . ESOPHAGOGASTRODUODENOSCOPY (EGD) WITH PROPOFOL N/A 03/29/2015   Procedure: ESOPHAGOGASTRODUODENOSCOPY (EGD) WITH PROPOFOL;  Surgeon: Josefine Class, MD;  Location: [facility] ENDOSCOPY;  Service: Endoscopy;  Laterality: N/A;  . HERNIA REPAIR    . laproscopy    . TONSILLECTOMY    . uteral suspension      Family History  Problem Relation Age of Onset  . Heart disease Father   . Cancer Mother        lung  . Urolithiasis Neg Hx   . Kidney disease Neg Hx   . Kidney cancer Neg Hx   . Prostate cancer Neg Hx    Social History:  reports that she quit smoking about 40 years ago. She has never used smokeless tobacco. She reports that she does not drink alcohol or use drugs.  Allergies:  Allergies  Allergen Reactions  . Bee Venom Hives  . Darvon [Propoxyphene] Anaphylaxis and Hives    Hives/throat swelling   . Dicyclomine Itching  . Other Hives    Spider bites cause hives  . Oxycodone-Acetaminophen Nausea And Vomiting and Other (See Comments)    hallucinations   . Peanuts [Peanut Oil] Anaphylaxis and Hives  . Penicillins Hives    Has patient had a PCN reaction causing  immediate rash, facial/tongue/throat swelling, SOB or lightheadedness with hypotension: Unknown Has patient had a PCN reaction causing severe rash involving mucus membranes or skin necrosis: Unknown Has patient had a PCN reaction that required hospitalization: Unknown Has patient had a PCN reaction occurring within the last 10 years: Unknown If all of the above answers are "NO", then may proceed with Cephalosporin use.  . Sulfa Antibiotics Anaphylaxis, Hives and Itching  . Sulfacetamide Sodium Anaphylaxis, Hives and Itching  . Ultram [Tramadol] Hives  . Amikacin Nausea And Vomiting  . Aspirin Hives  . Doxycycline Photosensitivity  . Haloperidol Nausea And Vomiting  . Hymenoptera Venom Preparations Hives    Reaction to spider bites  . Imipramine Hives    Reaction to Tofranil  . Keflex [Cephalexin] Hives  . Lactose Intolerance (Gi)   . Sulfur Hives  . Adhesive [Tape] Hives and Rash  . Hydroxyzine Anxiety and Itching  . Prednisone Nausea And Vomiting, Anxiety and Other (See Comments)    Crazy mood swings, strange thoughts (pt does tolerate cortisone injections)    Medications Prior to Admission  Medication Sig Dispense Refill  . amLODipine (NORVASC) 5 MG tablet Take 1 tablet (5 mg total) by mouth 2 (two) times daily. 180 tablet 1  . ANORO ELLIPTA  62.5-25 MCG/INH AEPB Inhale 1 puff into the lungs daily. 14 each 0  . calcium carbonate (OS-CAL) 600 MG TABS tablet Take 1 tablet (600 mg total) by mouth 2 (two) times daily with a meal. (Patient taking differently: Take 600 mg 3 (three) times daily by mouth. ) 60 tablet 0  . cholecalciferol (VITAMIN D) 1000 units tablet Take 4,000 Units by mouth daily.    . citalopram (CELEXA) 20 MG tablet Take 20 mg by mouth daily.  0  . conjugated estrogens (PREMARIN) vaginal cream Place vaginally 3 (three) times a week. Use 1/2 gram 3-4 times weekly. 42.5 g 4  . cycloSPORINE (RESTASIS) 0.05 % ophthalmic emulsion Place 1 drop into both eyes 2 (two) times  daily. 60 each 5  . diclofenac sodium (VOLTAREN) 1 % GEL Apply 2 g topically 4 (four) times daily.    . ferrous sulfate (FERROUSUL) 325 (65 FE) MG tablet Take 1 tablet (325 mg total) by mouth daily with breakfast. 90 tablet 1  . gabapentin (NEURONTIN) 100 MG capsule Take 1-2 capsules (100-200 mg total) by mouth 3 (three) times daily. (Patient taking differently: Take 100 mg by mouth 2 (two) times daily. ) 540 capsule 1  . guaiFENesin (MUCINEX) 600 MG 12 hr tablet Take 1 tablet (600 mg total) by mouth 2 (two) times daily as needed for cough. (Patient taking differently: Take 600 mg by mouth daily. ) 30 tablet 0  . hydrocortisone (PROCTOZONE-HC) 2.5 % rectal cream Place 1 application rectally 2 (two) times daily. 30 g 1  . hydrOXYzine (ATARAX/VISTARIL) 10 MG tablet Take 1 tablet by mouth 2 (two) times daily as needed for anxiety.     Marland Kitchen KRILL OIL OMEGA-3 PO Take 350 mg by mouth daily.    Marland Kitchen levalbuterol (XOPENEX) 1.25 MG/3ML nebulizer solution Inhale 3 mLs into the lungs daily.    Marland Kitchen levothyroxine (SYNTHROID, LEVOTHROID) 100 MCG tablet Take 1 tablet (100 mcg total) by mouth daily before breakfast. 90 tablet 1  . linaclotide (LINZESS) 290 MCG CAPS capsule Take 1 capsule (290 mcg total) by mouth daily before breakfast. 90 capsule 1  . magnesium oxide (MAG-OX) 400 MG tablet Take 400 mg by mouth daily with breakfast.    . montelukast (SINGULAIR) 10 MG tablet Take 1 tablet by mouth at bedtime.  11  . omeprazole (PRILOSEC) 20 MG capsule Take 1 capsule by mouth daily.    Vladimir Faster Glycol-Propyl Glycol (SYSTANE OP) Place 1 drop into both eyes daily as needed (dry eyes).    . polyethylene glycol powder (GLYCOLAX/MIRALAX) powder Take 17-34 g by mouth daily. 1700 g 5  . Probiotic Product (PROBIOTIC PO) Take 1 tablet by mouth daily.    . promethazine (PHENERGAN) 25 MG tablet Take 0.5 tablets (12.5 mg total) by mouth every 6 (six) hours as needed for nausea or vomiting. 30 tablet 0  . Pyridoxine HCl (VITAMIN B-6)  500 MG tablet Take 1 tablet by mouth 2 (two) times daily.    . ranitidine (ZANTAC) 150 MG tablet Take 150 mg by mouth daily before supper.    Marland Kitchen tiZANidine (ZANAFLEX) 2 MG tablet Take 0.5-1 tablets (1-2 mg total) by mouth 2 (two) times daily as needed for muscle spasms. 180 tablet 1  . tolterodine (DETROL LA) 2 MG 24 hr capsule Take 1 capsule (2 mg total) daily by mouth. 30 capsule 11  . traZODone (DESYREL) 50 MG tablet Take 50 mg by mouth at bedtime.      Results for orders placed or performed  during the hospital encounter of 09/19/17 (from the past 48 hour(s))  Glucose, capillary     Status: None   Collection Time: 10/22/17  7:10 AM  Result Value Ref Range   Glucose-Capillary 83 70 - 99 mg/dL   Comment 1 Notify RN    No results found.  Review of Systems  Constitutional: Negative.   HENT: Negative.   Eyes: Negative.   Respiratory: Negative.   Cardiovascular: Negative.   Gastrointestinal: Negative.   Musculoskeletal: Negative.   Skin: Negative.   Neurological: Negative.   Psychiatric/Behavioral: Positive for depression. Negative for hallucinations, memory loss, substance abuse and suicidal ideas. The patient is nervous/anxious and has insomnia.     Blood pressure (!) 162/88, pulse 72, temperature 98.4 F (36.9 C), temperature source Oral, resp. rate 18, height 5\' 3"  (1.6 m), weight 53.1 kg, SpO2 91 %. Physical Exam  Nursing note and vitals reviewed. Constitutional: She appears well-developed and well-nourished.  HENT:  Head: Normocephalic and atraumatic.  Eyes: Pupils are equal, round, and reactive to light. Conjunctivae are normal.  Neck: Normal range of motion.  Cardiovascular: Regular rhythm and normal heart sounds.  Respiratory: Effort normal. No respiratory distress.  GI: Soft.  Musculoskeletal: Normal range of motion.  Neurological: She is alert.  Skin: Skin is warm and dry.  Psychiatric: Her affect is labile and inappropriate. Her speech is tangential. She is  agitated. She is not aggressive. Thought content is not paranoid. She expresses impulsivity. She expresses no homicidal and no suicidal ideation. She exhibits abnormal recent memory.     Assessment/Plan Patient did she seem to be showing some improvement with ECT and since she is still here and we have not found any disposition we will plan to continue with 3 times a week through this week.  Alethia Berthold, MD 10/22/2017, 5:15 PM

## 2017-10-23 MED ORDER — TRAZODONE HCL 100 MG PO TABS
100.0000 mg | ORAL_TABLET | Freq: Every day | ORAL | Status: DC
  Administered 2017-10-23 – 2017-11-05 (×14): 100 mg via ORAL
  Filled 2017-10-23 (×15): qty 1

## 2017-10-23 NOTE — Progress Notes (Signed)
[facility] MD Progress Note  10/23/2017 6:18 PM Angelice Piech  MRN:  [id number] Subjective: Follow-up today for this patient with her chronic mood disorder.  This evening she was hyperverbal agitated wanting to talk about how she needs more education on how not to fall.  Not exactly psychotic but just a lot of pointless conversation that was difficult to follow.  Patient still is not able to except I think the reality of the need for her to go to some kind of safe accommodation after discharge.  She is still complaining about being on too much sleeping medicine even though she does not sleep very well. Principal Problem: Bipolar I disorder, most recent episode mixed, severe with psychotic features ([place]) Diagnosis:   Patient Active Problem List   Diagnosis Date Noted  . Bipolar I disorder, most recent episode mixed, severe with psychotic features ([place]) [F31.64] 09/18/2017  . Agitation [R45.1] 09/18/2017  . IBS (irritable bowel syndrome) [K58.9] 11/17/2016  . Bilateral carpal tunnel syndrome [G56.03] 11/17/2016  . Plantar fasciitis, bilateral [M72.2] 11/17/2016  . Chronic pain syndrome [G89.4] 11/17/2016  . Chronic hyponatremia [E87.1] 11/17/2016  . PTSD (post-traumatic stress disorder) [F43.10] 11/17/2016  . Hernia, hiatal [K44.9] 11/17/2016  . Vitamin D deficiency [E55.9] 11/17/2016  . Vitamin B12 deficiency [E53.8] 11/17/2016  . SIADH (syndrome of inappropriate ADH production) ([place]) [E22.2] 09/08/2016  . Cervical spondylosis with myelopathy [M47.12] 05/24/2016  . Post-concussion headache [G44.309] 05/24/2016  . MCI (mild cognitive impairment) with memory loss [G31.84] 03/30/2016  . Gait abnormality [R26.9] 03/30/2016  . Chronic bipolar affective disorder ([place]) [F31.9] 01/24/2016  . Anemia, iron deficiency [D50.9] 06/24/2015  . Endometriosis [N80.9] 06/24/2015  . Essential hypertension [I10] 06/24/2015  . History of postmenopausal bleeding [Z87.42] 05/22/2015  . History of TIA (transient  ischemic attack) [Z86.73] 04/20/2015  . Hypothyroidism [E03.9] 04/20/2015  . GERD (gastroesophageal reflux disease) [K21.9] 04/20/2015  . Cervical disc disorder with radiculopathy [M50.10] 03/25/2015  . Chronic neck pain [M54.2, G89.29] 03/25/2015  . Pelvic pain in female [R10.2] 10/30/2014  . History of skin cancer [Z85.828] 06/30/2014  . Chronic headaches [R51] 12/16/2013  . Neuropathy [G62.9] 12/16/2013   Total Time spent with patient: 20 minutes  Past Psychiatric History: See previous notes a long history of mental illness  Past Medical History:  Past Medical History:  Diagnosis Date  . Allergic rhinitis   . Anemia   . Blurred vision   . Depression   . Diverticulosis   . Endometriosis   . Falls   . GERD (gastroesophageal reflux disease)   . Hernia, hiatal   . Hypothyroidism   . IBS (irritable bowel syndrome)   . Malignant neoplasm of skin   . Migraine   . Neuropathy   . PTSD (post-traumatic stress disorder)   . Thyroid disease     Past Surgical History:  Procedure Laterality Date  . APPENDECTOMY    . CHOLECYSTECTOMY    . CHOLECYSTECTOMY, LAPAROSCOPIC    . ESOPHAGOGASTRODUODENOSCOPY (EGD) WITH PROPOFOL N/A 03/29/2015   Procedure: ESOPHAGOGASTRODUODENOSCOPY (EGD) WITH PROPOFOL;  Surgeon: Josefine Class, MD;  Location: [facility] ENDOSCOPY;  Service: Endoscopy;  Laterality: N/A;  . HERNIA REPAIR    . laproscopy    . TONSILLECTOMY    . uteral suspension     Family History:  Family History  Problem Relation Age of Onset  . Heart disease Father   . Cancer Mother        lung  . Urolithiasis Neg Hx   . Kidney disease Neg  Hx   . Kidney cancer Neg Hx   . Prostate cancer Neg Hx    Family Psychiatric  History: See previous note Social History:  Social History   Substance and Sexual Activity  Alcohol Use No     Social History   Substance and Sexual Activity  Drug Use No    Social History   Socioeconomic History  . Marital status: Single    Spouse name:  Not on file  . Number of children: 0  . Years of education: 1.5 years of college  . Highest education level: Not on file  Occupational History  . Occupation: Retired  Scientific laboratory technician  . Financial resource strain: Not on file  . Food insecurity:    Worry: Not on file    Inability: Not on file  . Transportation needs:    Medical: Not on file    Non-medical: Not on file  Tobacco Use  . Smoking status: Former Smoker    Last attempt to quit: 11/17/1976    Years since quitting: 40.9  . Smokeless tobacco: Never Used  Substance and Sexual Activity  . Alcohol use: No  . Drug use: No  . Sexual activity: Not Currently  Lifestyle  . Physical activity:    Days per week: Not on file    Minutes per session: Not on file  . Stress: Not on file  Relationships  . Social connections:    Talks on phone: Not on file    Gets together: Not on file    Attends religious service: Not on file    Active member of club or organization: Not on file    Attends meetings of clubs or organizations: Not on file    Relationship status: Not on file  Other Topics Concern  . Not on file  Social History Narrative   Lives at home alone.   Right-handed.   No daily caffeine use.   Additional Social History:    Pain Medications: See PTA Prescriptions: See PTA Over the Counter: See PTA History of alcohol / drug use?: No history of alcohol / drug abuse Longest period of sobriety (when/how long): Reports of none Negative Consequences of Use: (n/a) Withdrawal Symptoms: (n/a)                    Sleep: Fair  Appetite:  Fair  Current Medications: Current Facility-Administered Medications  Medication Dose Route Frequency Provider Last Rate Last Dose  . acetaminophen (TYLENOL) tablet 650 mg  650 mg Oral Q6H PRN Sharifah Champine, Madie Reno, MD   650 mg at 10/23/17 1320  . alum & mag hydroxide-simeth (MAALOX/MYLANTA) 200-200-20 MG/5ML suspension 30 mL  30 mL Oral Q4H PRN Bhargav Barbaro T, MD   30 mL at 10/08/17 1507   . amLODipine (NORVASC) tablet 5 mg  5 mg Oral BID Poetry Cerro, Madie Reno, MD   5 mg at 10/23/17 1308  . calcium carbonate (TUMS - dosed in mg elemental calcium) chewable tablet 400 mg of elemental calcium  2 tablet Oral Q breakfast Pucilowska, Jolanta B, MD   400 mg of elemental calcium at 10/23/17 1309  . cholecalciferol (VITAMIN D) tablet 1,000 Units  1,000 Units Oral Daily Pucilowska, Jolanta B, MD   1,000 Units at 10/23/17 1308  . cycloSPORINE (RESTASIS) 0.05 % ophthalmic emulsion 1 drop  1 drop Both Eyes BID Shajuan Musso, Madie Reno, MD   1 drop at 10/23/17 1307  . diclofenac sodium (VOLTAREN) 1 % transdermal gel 4 g  4 g  Topical QID Pucilowska, Jolanta B, MD   4 g at 10/23/17 1312  . feeding supplement (BOOST / RESOURCE BREEZE) liquid 1 Container  1 Container Oral TID BM Lenay Lovejoy, Madie Reno, MD   1 Container at 10/23/17 1455  . ferrous sulfate tablet 325 mg  325 mg Oral Q breakfast Pucilowska, Jolanta B, MD   325 mg at 10/23/17 1308  . gabapentin (NEURONTIN) capsule 100 mg  100 mg Oral BID Terez Montee, Madie Reno, MD   100 mg at 10/23/17 1737  . glycopyrrolate (ROBINUL) injection 0.1 mg  0.1 mg Intravenous Once Yen Wandell T, MD      . hydrocortisone cream 1 %   Topical BID Antwion Carpenter T, MD      . ipratropium (ATROVENT) 0.03 % nasal spray 2 spray  2 spray Each Nare TID He, Jun, MD   2 spray at 10/23/17 1738  . levalbuterol (XOPENEX) nebulizer solution 0.63 mg  0.63 mg Nebulization Q1400 Nohelani Benning, Madie Reno, MD   0.63 mg at 10/23/17 1722  . levothyroxine (SYNTHROID, LEVOTHROID) tablet 100 mcg  100 mcg Oral QAC breakfast Pucilowska, Jolanta B, MD   100 mcg at 10/23/17 9563  . lidocaine (LIDODERM) 5 % 1 patch  1 patch Transdermal Q24H Pucilowska, Jolanta B, MD   1 patch at 10/23/17 1318  . linaclotide (LINZESS) capsule 290 mcg  290 mcg Oral QAC breakfast Pucilowska, Jolanta B, MD   290 mcg at 10/23/17 8756  . lip balm (BLISTEX) ointment   Topical PRN Arelia Volpe T, MD      . loperamide (IMODIUM) capsule 2 mg  2 mg Oral  PRN Pucilowska, Jolanta B, MD   2 mg at 10/18/17 0002  . LORazepam (ATIVAN) tablet 0.5 mg  0.5 mg Oral Q6H PRN Kloi Brodman, Madie Reno, MD   0.5 mg at 10/20/17 2134  . magnesium hydroxide (MILK OF MAGNESIA) suspension 30 mL  30 mL Oral Daily PRN Dilyn Osoria, Madie Reno, MD   30 mL at 10/05/17 1731  . magnesium oxide (MAG-OX) tablet 200 mg  200 mg Oral BID Pucilowska, Jolanta B, MD   200 mg at 10/23/17 1734  . midazolam (VERSED) injection 2 mg  2 mg Intravenous Once Kyrian Stage T, MD      . multivitamin with minerals tablet 1 tablet  1 tablet Oral Daily Pucilowska, Jolanta B, MD   1 tablet at 10/23/17 1314  . omega-3 acid ethyl esters (LOVAZA) capsule 1 g  1 g Oral BID Pucilowska, Jolanta B, MD   1 g at 10/23/17 1733  . ondansetron (ZOFRAN) injection 4 mg  4 mg Intravenous Once Anmol Paschen T, MD      . ondansetron [facility]) injection 4 mg  4 mg Intravenous Once PRN Gunnar Fusi, MD      . ondansetron [facility]) injection 4 mg  4 mg Intravenous Once PRN Gunnar Bulla, MD      . ondansetron [facility]) injection 4 mg  4 mg Intravenous Once PRN Alvin Critchley, MD      . ondansetron [facility]) injection 4 mg  4 mg Intravenous Once Jeidy Hoerner T, MD      . ondansetron (ZOFRAN-ODT) disintegrating tablet 4 mg  4 mg Oral Q8H PRN Pucilowska, Jolanta B, MD   4 mg at 10/08/17 1807  . PARoxetine (PAXIL-CR) 24 hr tablet 25 mg  25 mg Oral Daily Madilyne Tadlock T, MD   25 mg at 10/23/17 1316  . polyethylene glycol (MIRALAX / GLYCOLAX) packet 17 g  17 g  Oral Daily Pucilowska, Jolanta B, MD   17 g at 10/23/17 1318  . polyvinyl alcohol (LIQUIFILM TEARS) 1.4 % ophthalmic solution 1 drop  1 drop Both Eyes PRN Ramond Dial, MD   1 drop at 10/22/17 2308  . prazosin (MINIPRESS) capsule 2 mg  2 mg Oral QHS Kayceon Oki, Madie Reno, MD   2 mg at 10/22/17 2140  . promethazine (PHENERGAN) tablet 12.5 mg  12.5 mg Oral Q6H PRN Dilon Lank T, MD   12.5 mg at 10/22/17 1443  . pyridOXINE (VITAMIN B-6) tablet 100 mg  100 mg Oral BID Jari Carollo, Madie Reno, MD   100 mg at 10/23/17 1733  . tiZANidine (ZANAFLEX) tablet 2 mg  2 mg Oral Q8H PRN Pucilowska, Jolanta B, MD   2 mg at 10/21/17 1600  . traZODone (DESYREL) tablet 150 mg  150 mg Oral QHS Chauncey Mann, MD   150 mg at 10/22/17 2140  . triamcinolone cream (KENALOG) 0.1 %   Topical TID PRN Ramond Dial, MD   1 application at 78/46/96 2106  . vitamin C (ASCORBIC ACID) tablet 1,000 mg  1,000 mg Oral Daily Pucilowska, Jolanta B, MD   1,000 mg at 10/23/17 1309    Lab Results:  Results for orders placed or performed during the hospital encounter of 09/19/17 (from the past 48 hour(s))  Glucose, capillary     Status: None   Collection Time: 10/22/17  7:10 AM  Result Value Ref Range   Glucose-Capillary 83 70 - 99 mg/dL   Comment 1 Notify RN     Blood Alcohol level:  Lab Results  Component Value Date   ETH <5 09/29/2016   ETH <5 29/52/8413    Metabolic Disorder Labs: Lab Results  Component Value Date   HGBA1C 5.7 (H) 09/20/2017   MPG 116.89 09/20/2017   No results found for: PROLACTIN Lab Results  Component Value Date   CHOL 173 09/20/2017   TRIG 193 (H) 09/20/2017   HDL 50 09/20/2017   CHOLHDL 3.5 09/20/2017   VLDL 39 09/20/2017   LDLCALC 84 09/20/2017   LDLCALC 87 09/04/2017    Physical Findings: AIMS: Facial and Oral Movements Muscles of Facial Expression: None, normal Lips and Perioral Area: None, normal Jaw: None, normal Tongue: None, normal,Extremity Movements Upper (arms, wrists, hands, fingers): None, normal Lower (legs, knees, ankles, toes): None, normal, Trunk Movements Neck, shoulders, hips: None, normal, Overall Severity Severity of abnormal movements (highest score from questions above): None, normal Incapacitation due to abnormal movements: None, normal Patient's awareness of abnormal movements (rate only patient's report): No Awareness, Dental Status Current problems with teeth and/or dentures?: No Does patient usually wear dentures?: No  CIWA:     COWS:     Musculoskeletal: Strength & Muscle Tone: within normal limits Gait & Station: normal Patient leans: N/A  Psychiatric Specialty Exam: Physical Exam  Nursing note and vitals reviewed. Constitutional: She appears well-developed and well-nourished.  HENT:  Head: Normocephalic and atraumatic.  Eyes: Pupils are equal, round, and reactive to light. Conjunctivae are normal.  Neck: Normal range of motion.  Cardiovascular: Regular rhythm and normal heart sounds.  Respiratory: Effort normal. No respiratory distress.  GI: Soft.  Musculoskeletal: Normal range of motion.  Neurological: She is alert.  Skin: Skin is warm and dry.  Psychiatric: Her mood appears anxious. Her speech is rapid and/or pressured and tangential. She is agitated.    Review of Systems  Constitutional: Negative.   HENT: Negative.   Eyes: Negative.  Respiratory: Negative.   Cardiovascular: Negative.   Gastrointestinal: Negative.   Musculoskeletal: Negative.   Skin: Negative.   Neurological: Negative.   Psychiatric/Behavioral: The patient is nervous/anxious.     Blood pressure 126/87, pulse 71, temperature 98.7 F (37.1 C), temperature source Oral, resp. rate 18, height 5\' 3"  (1.6 m), weight 53.1 kg, SpO2 98 %.Body mass index is 20.73 kg/m.  General Appearance: Casual  Eye Contact:  Good  Speech:  Clear and Coherent  Volume:  Increased  Mood:  Anxious  Affect:  Congruent  Thought Process:  Disorganized  Orientation:  Full (Time, Place, and Person)  Thought Content:  Illogical  Suicidal Thoughts:  No  Homicidal Thoughts:  No  Memory:  Immediate;   Fair Recent;   Fair Remote;   Fair  Judgement:  Fair  Insight:  Fair  Psychomotor Activity:  Normal  Concentration:  Concentration: Fair  Recall:  [name]:  Fair  Language:  Fair  Akathisia:  No  Handed:  Right  AIMS (if indicated):     Assets:  Desire for Improvement  ADL's:  Impaired  Cognition:  Impaired,  Mild  Sleep:   Number of Hours: 6.3     Treatment Plan Summary: Daily contact with patient to assess and evaluate symptoms and progress in treatment, Medication management and Plan ECT again tomorrow I think it is helping with stabilizing her.  Social work and other treatment team members still working on disposition.  Alethia Berthold, MD 10/23/2017, 6:18 PM

## 2017-10-23 NOTE — Progress Notes (Signed)
D: Pt denies SI/HI/AVH, verbally will contract for safety. Pt. This evening less agitated and aggressive. Pt. Less somatic and aggressively fixated on having staff address physical complaints. Pt. To a degree less intrusive and demanding, more redirectable. Pt. When redirected this evening able to not shout or aggressively talk down to staff. Pt. This evening complaint with front wheel walker for ambulation as needed for safety. Pt. Able to engage some-what appropriately during conversation with this writer during assessments and reports anxiety and depression, "better after I got some things straightened out, yes". Pt. Attends snacks and groups. Pt. Overall still labile and irritable frequently, but to lesser degree. Pt. Reports ECT is helping.     A: Q x 15 minute observation checks were completed for safety. Patient was provided with education, but is non-accepting of this.  Patient was given/offered medications per orders. Patient  was encourage to attend groups, participate in unit activities and continue with plan of care. Pt. Chart and plans of care reviewed. Pt. Given support and encouragement.   R: Patient is complaint with medication and unit procedures. Pt. Took her medications during night time medications at the appropriate timings, with only minimal irritability and argumentative behavior. Pt. Does continue to report that, "the doctor is completely screwing up all my medications". Pt. Denies S/S or pain from ECT, but does continue to report chronic generalized pain that is being addressed with provider orders.              Precautionary checks every 15 minutes for safety maintained, room free of safety hazards, patient sustains no injury or falls during this shift. Will endorse care to next shift.

## 2017-10-23 NOTE — Plan of Care (Signed)
Patient slept till lunch time.Patient easily get irritable when her requests are denied.Compliant with medications.Patient repeatedly states "the state told me that I don't need to go to a group home."Patient is more concern about getting winter cloths,calling [employer] and calling her friend to buy that for her.

## 2017-10-23 NOTE — Progress Notes (Signed)
Patient ID: Angela [name], female   DOB: 03-Aug-1950, 67 y.o.   MRN: [id number]   CSW met one on one with Pt, who continues to be disorganized.  CSW allowed her to call a provider about her ear.  CSW explained to her that it is unlikely that she will be sent to an ALF to recover from Ear surgery as she plans.  CSW explained that she will need to pursue apartments and other independent placement once she is in better health.  Pt blames Unit staff, specifically Social workers for not helping her.  CSW confronted Pt's unrealistic expectations of what the hospital is able to achieve, especially within the boundaries and wish list that she insists that we abide by in what she defines "appropriate housing".  CSW reminded Pt that she would follow up on referral made to the group home in [place] and see what they had available.  She continues to be very focused on if they will wash her hair there or not.  CSW redirects Pt to focusing on where we will live is more of a Priority that a surgery she may not even get.  Pt very frustrated, accusatory that CSW doesn't care what happens to her, that CSW is insulting her, that she is independent and doesn't want to be in a group home.  CSW reminds her of her alternatives at this point are placement or being discharged to a shelter or hotel on her own.  Pt accuses CSW of not knowing [facility] and CSW ended interview.  Per CSW Claudine [name], Pt finally agreed to allow FL-2 to be sent out to a group home in [place].  CSW sent Pt FL-[facility] to [facility] [place] to follow up on referral, Spoke with [place].  They have been out of town but state they will look into referral when they return and we could talk tomorrow once they review. CSW paged MD to discuss Pt care.  Dossie Arbour, LCSW

## 2017-10-23 NOTE — [facility] Group Notes (Signed)
10/23/2017 1PM  Type of Therapy/Topic:  Group Therapy:  Feelings about Diagnosis  Participation Level:  Did Not Attend   Description of Group:   This group will allow patients to explore their thoughts and feelings about diagnoses they have received. Patients will be guided to explore their level of understanding and acceptance of these diagnoses. Facilitator will encourage patients to process their thoughts and feelings about the reactions of others to their diagnosis and will guide patients in identifying ways to discuss their diagnosis with significant others in their lives. This group will be process-oriented, with patients participating in exploration of their own experiences, giving and receiving support, and processing challenge from other group members.   Therapeutic Goals: 1. Patient will demonstrate understanding of diagnosis as evidenced by identifying two or more symptoms of the disorder 2. Patient will be able to express two feelings regarding the diagnosis 3. Patient will demonstrate their ability to communicate their needs through discussion and/or role play  Summary of Patient Progress: Patient was encouraged and invited to attend group. Patient did not attend group. Social worker will continue to encourage group participation in the future.        Therapeutic Modalities:   Cognitive Behavioral Therapy Brief Therapy Feelings Identification    Darin Engels, Marlinda Mike 10/23/2017 3:34 PM

## 2017-10-23 NOTE — Plan of Care (Signed)
Pt. Was able to be complaint with medications this evening. Pt. Denies SI/HI, verbally is able to contract for safety.    Problem: Medication: Goal: Compliance with prescribed medication regimen will improve Outcome: Progressing   Problem: Self-Concept: Goal: Ability to disclose and discuss suicidal ideas will improve Outcome: Progressing

## 2017-10-23 NOTE — [facility] Group Notes (Signed)
  LCSW Group Therapy Note   10/23/2017 1:00pm   Type of Therapy and Topic:  Group Therapy:  Overcoming Obstacles   Participation Level:  Did Not Attend   Description of Group:    In this group patients will be encouraged to explore what they see as obstacles to their own wellness and recovery. They will be guided to discuss their thoughts, feelings, and behaviors related to these obstacles. The group will process together ways to cope with barriers, with attention given to specific choices patients can make. Each patient will be challenged to identify changes they are motivated to make in order to overcome their obstacles. This group will be process-oriented, with patients participating in exploration of their own experiences as well as giving and receiving support and challenge from other group members.   Therapeutic Goals: 1. Patient will identify personal and current obstacles as they relate to admission. 2. Patient will identify barriers that currently interfere with their wellness or overcoming obstacles.  3. Patient will identify feelings, thought process and behaviors related to these barriers. 4. Patient will identify two changes they are willing to make to overcome these obstacles:      Summary of Patient Progress      Therapeutic Modalities:   Cognitive Behavioral Therapy Solution Focused Therapy Motivational Interviewing Relapse Prevention Therapy  August Saucer, LCSW 10/23/2017 9:20 AM

## 2017-10-24 ENCOUNTER — Inpatient Hospital Stay: Payer: Medicare Other

## 2017-10-24 ENCOUNTER — Inpatient Hospital Stay: Payer: Medicare Other | Admitting: Anesthesiology

## 2017-10-24 ENCOUNTER — Other Ambulatory Visit: Payer: Self-pay | Admitting: Psychiatry

## 2017-10-24 LAB — GLUCOSE, CAPILLARY: GLUCOSE-CAPILLARY: 92 mg/dL (ref 70–99)

## 2017-10-24 MED ORDER — SUCCINYLCHOLINE CHLORIDE 20 MG/ML IJ SOLN
INTRAMUSCULAR | Status: AC
  Filled 2017-10-24: qty 1

## 2017-10-24 MED ORDER — KETAMINE HCL 10 MG/ML IJ SOLN
INTRAMUSCULAR | Status: DC | PRN
  Administered 2017-10-24: 50 mg via INTRAVENOUS

## 2017-10-24 MED ORDER — SUCCINYLCHOLINE CHLORIDE 20 MG/ML IJ SOLN
INTRAMUSCULAR | Status: DC | PRN
  Administered 2017-10-24: 80 mg via INTRAVENOUS

## 2017-10-24 MED ORDER — SODIUM CHLORIDE 0.9 % IV SOLN
INTRAVENOUS | Status: DC | PRN
  Administered 2017-10-24: 11:00:00 via INTRAVENOUS

## 2017-10-24 MED ORDER — FENTANYL CITRATE (PF) 100 MCG/2ML IJ SOLN: 25.0000 ug | INTRAMUSCULAR | Status: DC | PRN

## 2017-10-24 MED ORDER — ONDANSETRON HCL 4 MG/2ML IJ SOLN: 4.0000 mg | Freq: Once | INTRAMUSCULAR | Status: DC | PRN

## 2017-10-24 MED ORDER — KETAMINE HCL 50 MG/ML IJ SOLN
INTRAMUSCULAR | Status: AC
  Filled 2017-10-24: qty 10

## 2017-10-24 NOTE — Plan of Care (Signed)
  Problem: Coping: Goal: Coping ability will improve Outcome: Progressing  Patient is coping effectively .

## 2017-10-24 NOTE — Anesthesia Preprocedure Evaluation (Signed)
Anesthesia Evaluation  Patient identified by MRN, date of birth, ID band Patient awake    Reviewed: Allergy & Precautions, NPO status , Patient's Chart, lab work & pertinent test results  History of Anesthesia Complications Negative for: history of anesthetic complications  Airway Mallampati: II  TM Distance: >3 FB Neck ROM: Full    Dental  (+) Poor Dentition   Pulmonary neg sleep apnea, neg COPD, former smoker,    breath sounds clear to auscultation- rhonchi (-) wheezing      Cardiovascular hypertension, (-) CAD, (-) Past MI, (-) Cardiac Stents and (-) CABG  Rhythm:Regular Rate:Normal - Systolic murmurs and - Diastolic murmurs    Neuro/Psych  Headaches, PSYCHIATRIC DISORDERS Anxiety Depression Bipolar Disorder    GI/Hepatic Neg liver ROS, hiatal hernia, GERD  ,  Endo/Other  neg diabetesHypothyroidism   Renal/GU negative Renal ROS     Musculoskeletal negative musculoskeletal ROS (+)   Abdominal (+) - obese,   Peds  Hematology  (+) anemia ,   Anesthesia Other Findings Past Medical History: No date: Allergic rhinitis No date: Anemia No date: Blurred vision No date: Depression No date: Diverticulosis No date: Endometriosis No date: Falls No date: GERD (gastroesophageal reflux disease) No date: Hernia, hiatal No date: Hypothyroidism No date: IBS (irritable bowel syndrome) No date: Malignant neoplasm of skin No date: Migraine No date: Neuropathy No date: PTSD (post-traumatic stress disorder) No date: Thyroid disease   Reproductive/Obstetrics                             Anesthesia Physical  Anesthesia Plan  ASA: II  Anesthesia Plan: General   Post-op Pain Management:    Induction: Intravenous  PONV Risk Score and Plan: 2 and Promethazine  Airway Management Planned: Mask  Additional Equipment:   Intra-op Plan:   Post-operative Plan:   Informed Consent: I have reviewed  the patients History and Physical, chart, labs and discussed the procedure including the risks, benefits and alternatives for the proposed anesthesia with the patient or authorized representative who has indicated his/her understanding and acceptance.   Dental advisory given  Plan Discussed with: CRNA and Anesthesiologist  Anesthesia Plan Comments:         Anesthesia Quick Evaluation

## 2017-10-24 NOTE — Procedures (Signed)
ECT SERVICES Physician's Interval Evaluation & Treatment Note  Patient Identification: Angela [name] MRN:  [id number] Date of Evaluation:  10/24/2017 TX #: [id number]  MADRS:   MMSE:   P.E. Findings:  No change to physical exam  Psychiatric Interval Note:  Chronic mood lability but today she is in pretty good spirits and overall things seem to be doing okay  Subjective:  Patient is a 67 y.o. female seen for evaluation for Electroconvulsive Therapy. Actually no specific new complaint today  Treatment Summary:   [x]   Right Unilateral             []  Bilateral   % Energy : 0.3 ms 100%   Impedance: 1370 ohms  Seizure Energy Index: 4474 V squared  Postictal Suppression Index: 89%  Seizure Concordance Index: 94%  Medications  Pre Shock: Toradol 30 mg ketamine 50 mg succinylcholine 80 mg  Post Shock:    Seizure Duration: EMG 34 seconds EEG 56 seconds   Comments: Patient actually seems to be doing pretty well with this and the memory impairment if any is minimal I think that is probably in her best interest to keep this going while she is in the hospital next treatment Friday  Lungs:  [x]   Clear to auscultation               []  Other:   Heart:    [x]   Regular rhythm             []  irregular rhythm    [x]   Previous H&P reviewed, patient examined and there are NO CHANGES                 []   Previous H&P reviewed, patient examined and there are changes noted.   Alethia Berthold, MD 10/9/201910:43 AM

## 2017-10-24 NOTE — Anesthesia Procedure Notes (Signed)
Date/Time: 10/24/2017 10:50 AM Performed by: Dionne Bucy, CRNA Pre-anesthesia Checklist: Patient identified, Emergency Drugs available, Suction available and Patient being monitored Patient Re-evaluated:Patient Re-evaluated prior to induction Oxygen Delivery Method: Circle system utilized Preoxygenation: Pre-oxygenation with 100% oxygen Induction Type: IV induction Ventilation: Mask ventilation without difficulty and Mask ventilation throughout procedure Airway Equipment and Method: Bite block Placement Confirmation: positive ETCO2 Dental Injury: Teeth and Oropharynx as per pre-operative assessment

## 2017-10-24 NOTE — Progress Notes (Signed)
Recreation Therapy Notes  Date: 10/24/2017  Time: 9:30 am   Location: Craft room   Behavioral response: N/A   Intervention Topic: Relaxation  Discussion/Intervention: Patient did not attend group.   Clinical Observations/Feedback:  Patient did not attend group.   Shant Hence LRT/CTRS        Lesleigh Hughson 10/24/2017 12:14 PM

## 2017-10-24 NOTE — Transfer of Care (Signed)
Immediate Anesthesia Transfer of Care Note  Patient: Angela [name]  Procedure(s) Performed: ECT TX  Patient Location: PACU  Anesthesia Type:General  Level of Consciousness: sedated  Airway & Oxygen Therapy: Patient Spontanous Breathing and Patient connected to face mask oxygen  Post-op Assessment: Report given to RN and Post -op Vital signs reviewed and stable  Post vital signs: Reviewed and stable  Last Vitals:  Vitals Value Taken Time  BP 150/78 10/24/2017 11:00 AM  Temp 36.4 C 10/24/2017 11:00 AM  Pulse 64 10/24/2017 11:01 AM  Resp 15 10/24/2017 11:01 AM  SpO2 100 % 10/24/2017 11:01 AM  Vitals shown include unvalidated device data.  Last Pain:  Vitals:   10/24/17 0944  TempSrc:   PainSc: 0-No pain      Patients Stated Pain Goal: 4 (28/31/51 7616)  Complications: No apparent anesthesia complications

## 2017-10-24 NOTE — Plan of Care (Signed)
Patient has the ability to make informed decisions regarding her care and has been present in the milieu throughout the afternoon after ECT therapy.  Patient has the ability to identify the available resources that can assist in meeting her health-care needs, in which her goal is to find housing so that she can be discharged. Patient was compliant with her medications this afternoon without any issues. Patient denies SI/HI/AVH at this time. Patient remains safe on the unit at this time.  Problem: Education: Goal: Ability to make informed decisions regarding treatment will improve Outcome: Progressing   Problem: Coping: Goal: Coping ability will improve Outcome: Progressing   Problem: Health Behavior/Discharge Planning: Goal: Identification of resources available to assist in meeting health care needs will improve Outcome: Progressing   Problem: Medication: Goal: Compliance with prescribed medication regimen will improve Outcome: Progressing   Problem: Self-Concept: Goal: Ability to disclose and discuss suicidal ideas will improve Outcome: Progressing Goal: Will verbalize positive feelings about self Outcome: Progressing   Problem: Safety: Goal: Ability to remain free from injury will improve Outcome: Progressing   Problem: Spiritual Needs Goal: Ability to function at adequate level Outcome: Progressing

## 2017-10-24 NOTE — Progress Notes (Signed)
D- Patient alert and oriented. Patient presents in a pleasant mood on assessment and had no major complaints to voice to this Probation officer. Patient denies SI, HI, AVH, and pain at this time. Patient also denies any signs/symptoms of depression and anxiety. Patient's goal for today is to "lighting a fire under the staff to get on bandwagon with me for housing after surgeries, no their idea of another group home, no more, no way". Patient also reports on her self-inventory "I need to be consulted on your vision for my future, we're way off. I have not even been asked or consulted this week, I'd like a peer support specialist soon".  A- Scheduled medications administered to patient, per MD orders. Support and encouragement provided.  Routine safety checks conducted every 15 minutes.  Patient informed to notify staff with problems or concerns.  R- No adverse drug reactions noted. Patient contracts for safety at this time. Patient compliant with medications and treatment plan. Patient receptive, calm, and cooperative. Patient interacts well with others on the unit.  Patient remains safe at this time.

## 2017-10-24 NOTE — Progress Notes (Signed)
[facility] MD Progress Note  10/24/2017 6:46 PM Angela [name]  MRN:  [id number] Subjective: Follow-up today for this woman with bipolar disorder.  She had ECT this morning which as usual was well tolerated without any complication.  Seen this afternoon she was unfortunately displaying some of the behaviors and personality features that make her so difficult to manage.  She was infuriated that another patient was so intrusive although the patient she was indicating was clearly psychotic and manic.  Patient was not able to incorporate this fact and move on.  She is fixated as usual on trivial somatic complaints.  Not reporting suicidal thoughts.  Not making any progress towards discharge. Principal Problem: Bipolar I disorder, most recent episode mixed, severe with psychotic features ([place]) Diagnosis:   Patient Active Problem List   Diagnosis Date Noted  . Bipolar I disorder, most recent episode mixed, severe with psychotic features ([place]) [F31.64] 09/18/2017  . Agitation [R45.1] 09/18/2017  . IBS (irritable bowel syndrome) [K58.9] 11/17/2016  . Bilateral carpal tunnel syndrome [G56.03] 11/17/2016  . Plantar fasciitis, bilateral [M72.2] 11/17/2016  . Chronic pain syndrome [G89.4] 11/17/2016  . Chronic hyponatremia [E87.1] 11/17/2016  . PTSD (post-traumatic stress disorder) [F43.10] 11/17/2016  . Hernia, hiatal [K44.9] 11/17/2016  . Vitamin D deficiency [E55.9] 11/17/2016  . Vitamin B12 deficiency [E53.8] 11/17/2016  . SIADH (syndrome of inappropriate ADH production) ([place]) [E22.2] 09/08/2016  . Cervical spondylosis with myelopathy [M47.12] 05/24/2016  . Post-concussion headache [G44.309] 05/24/2016  . MCI (mild cognitive impairment) with memory loss [G31.84] 03/30/2016  . Gait abnormality [R26.9] 03/30/2016  . Chronic bipolar affective disorder ([place]) [F31.9] 01/24/2016  . Anemia, iron deficiency [D50.9] 06/24/2015  . Endometriosis [N80.9] 06/24/2015  . Essential hypertension [I10] 06/24/2015   . History of postmenopausal bleeding [Z87.42] 05/22/2015  . History of TIA (transient ischemic attack) [Z86.73] 04/20/2015  . Hypothyroidism [E03.9] 04/20/2015  . GERD (gastroesophageal reflux disease) [K21.9] 04/20/2015  . Cervical disc disorder with radiculopathy [M50.10] 03/25/2015  . Chronic neck pain [M54.2, G89.29] 03/25/2015  . Pelvic pain in female [R10.2] 10/30/2014  . History of skin cancer [Z85.828] 06/30/2014  . Chronic headaches [R51] 12/16/2013  . Neuropathy [G62.9] 12/16/2013   Total Time spent with patient: 30 minutes  Past Psychiatric History: Long lifelong history of mental health problems  Past Medical History:  Past Medical History:  Diagnosis Date  . Allergic rhinitis   . Anemia   . Blurred vision   . Depression   . Diverticulosis   . Endometriosis   . Falls   . GERD (gastroesophageal reflux disease)   . Hernia, hiatal   . Hypothyroidism   . IBS (irritable bowel syndrome)   . Malignant neoplasm of skin   . Migraine   . Neuropathy   . PTSD (post-traumatic stress disorder)   . Thyroid disease     Past Surgical History:  Procedure Laterality Date  . APPENDECTOMY    . CHOLECYSTECTOMY    . CHOLECYSTECTOMY, LAPAROSCOPIC    . ESOPHAGOGASTRODUODENOSCOPY (EGD) WITH PROPOFOL N/A 03/29/2015   Procedure: ESOPHAGOGASTRODUODENOSCOPY (EGD) WITH PROPOFOL;  Surgeon: Josefine Class, MD;  Location: [facility] ENDOSCOPY;  Service: Endoscopy;  Laterality: N/A;  . HERNIA REPAIR    . laproscopy    . TONSILLECTOMY    . uteral suspension     Family History:  Family History  Problem Relation Age of Onset  . Heart disease Father   . Cancer Mother        lung  . Urolithiasis Neg Hx   .  Kidney disease Neg Hx   . Kidney cancer Neg Hx   . Prostate cancer Neg Hx    Family Psychiatric  History: See previous note Social History:  Social History   Substance and Sexual Activity  Alcohol Use No     Social History   Substance and Sexual Activity  Drug Use No     Social History   Socioeconomic History  . Marital status: Single    Spouse name: Not on file  . Number of children: 0  . Years of education: 1.5 years of college  . Highest education level: Not on file  Occupational History  . Occupation: Retired  Scientific laboratory technician  . Financial resource strain: Not on file  . Food insecurity:    Worry: Not on file    Inability: Not on file  . Transportation needs:    Medical: Not on file    Non-medical: Not on file  Tobacco Use  . Smoking status: Former Smoker    Last attempt to quit: 11/17/1976    Years since quitting: 40.9  . Smokeless tobacco: Never Used  Substance and Sexual Activity  . Alcohol use: No  . Drug use: No  . Sexual activity: Not Currently  Lifestyle  . Physical activity:    Days per week: Not on file    Minutes per session: Not on file  . Stress: Not on file  Relationships  . Social connections:    Talks on phone: Not on file    Gets together: Not on file    Attends religious service: Not on file    Active member of club or organization: Not on file    Attends meetings of clubs or organizations: Not on file    Relationship status: Not on file  Other Topics Concern  . Not on file  Social History Narrative   Lives at home alone.   Right-handed.   No daily caffeine use.   Additional Social History:    Pain Medications: See PTA Prescriptions: See PTA Over the Counter: See PTA History of alcohol / drug use?: No history of alcohol / drug abuse Longest period of sobriety (when/how long): Reports of none Negative Consequences of Use: (n/a) Withdrawal Symptoms: (n/a)                    Sleep: Fair  Appetite:  Fair  Current Medications: Current Facility-Administered Medications  Medication Dose Route Frequency Provider Last Rate Last Dose  . acetaminophen (TYLENOL) tablet 650 mg  650 mg Oral Q6H PRN Clapacs, Madie Reno, MD   650 mg at 10/24/17 1407  . alum & mag hydroxide-simeth (MAALOX/MYLANTA) 200-200-20  MG/5ML suspension 30 mL  30 mL Oral Q4H PRN Clapacs, John T, MD   30 mL at 10/08/17 1507  . amLODipine (NORVASC) tablet 5 mg  5 mg Oral BID Clapacs, John T, MD   5 mg at 10/24/17 1827  . calcium carbonate (TUMS - dosed in mg elemental calcium) chewable tablet 400 mg of elemental calcium  2 tablet Oral Q breakfast Pucilowska, Jolanta B, MD   400 mg of elemental calcium at 10/23/17 1309  . cholecalciferol (VITAMIN D) tablet 1,000 Units  1,000 Units Oral Daily Pucilowska, Jolanta B, MD   1,000 Units at 10/23/17 1308  . cycloSPORINE (RESTASIS) 0.05 % ophthalmic emulsion 1 drop  1 drop Both Eyes BID Clapacs, John T, MD   1 drop at 10/24/17 0900  . diclofenac sodium (VOLTAREN) 1 % transdermal gel 4 g  4 g Topical QID Pucilowska, Jolanta B, MD   4 g at 10/24/17 1830  . feeding supplement (BOOST / RESOURCE BREEZE) liquid 1 Container  1 Container Oral TID BM Clapacs, Madie Reno, MD   1 Container at 10/24/17 1500  . fentaNYL (SUBLIMAZE) injection 25 mcg  25 mcg Intravenous Q5 min PRN Alvin Critchley, MD      . ferrous sulfate tablet 325 mg  325 mg Oral Q breakfast Pucilowska, Jolanta B, MD   325 mg at 10/24/17 1234  . gabapentin (NEURONTIN) capsule 100 mg  100 mg Oral BID Clapacs, Madie Reno, MD   100 mg at 10/24/17 1827  . glycopyrrolate (ROBINUL) injection 0.1 mg  0.1 mg Intravenous Once Clapacs, John T, MD      . hydrocortisone cream 1 %   Topical BID Clapacs, John T, MD      . ipratropium (ATROVENT) 0.03 % nasal spray 2 spray  2 spray Each Nare TID He, Jun, MD   2 spray at 10/24/17 1234  . levalbuterol (XOPENEX) nebulizer solution 0.63 mg  0.63 mg Nebulization Q1400 Clapacs, Madie Reno, MD   0.63 mg at 10/24/17 1416  . levothyroxine (SYNTHROID, LEVOTHROID) tablet 100 mcg  100 mcg Oral QAC breakfast Pucilowska, Jolanta B, MD   100 mcg at 10/24/17 0629  . lidocaine (LIDODERM) 5 % 1 patch  1 patch Transdermal Q24H Pucilowska, Jolanta B, MD   1 patch at 10/24/17 1233  . linaclotide (LINZESS) capsule 290 mcg  290 mcg Oral  QAC breakfast Pucilowska, Jolanta B, MD   290 mcg at 10/24/17 0629  . lip balm (BLISTEX) ointment   Topical PRN Clapacs, John T, MD      . loperamide (IMODIUM) capsule 2 mg  2 mg Oral PRN Pucilowska, Jolanta B, MD   2 mg at 10/18/17 0002  . LORazepam (ATIVAN) tablet 0.5 mg  0.5 mg Oral Q6H PRN Clapacs, Madie Reno, MD   0.5 mg at 10/20/17 2134  . magnesium hydroxide (MILK OF MAGNESIA) suspension 30 mL  30 mL Oral Daily PRN Clapacs, Madie Reno, MD   30 mL at 10/05/17 1731  . magnesium oxide (MAG-OX) tablet 200 mg  200 mg Oral BID Pucilowska, Jolanta B, MD   200 mg at 10/24/17 1828  . midazolam (VERSED) injection 2 mg  2 mg Intravenous Once Clapacs, John T, MD      . multivitamin with minerals tablet 1 tablet  1 tablet Oral Daily Pucilowska, Jolanta B, MD   1 tablet at 10/24/17 1234  . omega-3 acid ethyl esters (LOVAZA) capsule 1 g  1 g Oral BID Pucilowska, Jolanta B, MD   1 g at 10/24/17 1827  . ondansetron (ZOFRAN) injection 4 mg  4 mg Intravenous Once Clapacs, John T, MD      . ondansetron [facility]) injection 4 mg  4 mg Intravenous Once PRN Gunnar Fusi, MD      . ondansetron [facility]) injection 4 mg  4 mg Intravenous Once PRN Gunnar Bulla, MD      . ondansetron [facility]) injection 4 mg  4 mg Intravenous Once PRN Alvin Critchley, MD      . ondansetron [facility]) injection 4 mg  4 mg Intravenous Once Clapacs, John T, MD      . ondansetron [facility]) injection 4 mg  4 mg Intravenous Once PRN Alvin Critchley, MD      . ondansetron (ZOFRAN-ODT) disintegrating tablet 4 mg  4 mg Oral Q8H PRN Pucilowska, Wardell Honour, MD  4 mg at 10/08/17 1807  . PARoxetine (PAXIL-CR) 24 hr tablet 25 mg  25 mg Oral Daily Clapacs, John T, MD   25 mg at 10/24/17 1235  . polyethylene glycol (MIRALAX / GLYCOLAX) packet 17 g  17 g Oral Daily Pucilowska, Jolanta B, MD   17 g at 10/23/17 1318  . polyvinyl alcohol (LIQUIFILM TEARS) 1.4 % ophthalmic solution 1 drop  1 drop Both Eyes PRN Ramond Dial, MD   1 drop at 10/24/17 1602  .  prazosin (MINIPRESS) capsule 2 mg  2 mg Oral QHS Clapacs, Madie Reno, MD   2 mg at 10/23/17 2254  . promethazine (PHENERGAN) tablet 12.5 mg  12.5 mg Oral Q6H PRN Clapacs, John T, MD   12.5 mg at 10/24/17 1234  . pyridOXINE (VITAMIN B-6) tablet 100 mg  100 mg Oral BID Clapacs, Madie Reno, MD   100 mg at 10/24/17 1828  . tiZANidine (ZANAFLEX) tablet 2 mg  2 mg Oral Q8H PRN Pucilowska, Jolanta B, MD   2 mg at 10/21/17 1600  . traZODone (DESYREL) tablet 100 mg  100 mg Oral QHS Clapacs, John T, MD   100 mg at 10/23/17 2255  . triamcinolone cream (KENALOG) 0.1 %   Topical TID PRN Ramond Dial, MD   1 application at 40/98/11 2106  . vitamin C (ASCORBIC ACID) tablet 1,000 mg  1,000 mg Oral Daily Pucilowska, Jolanta B, MD   1,000 mg at 10/24/17 1236    Lab Results:  Results for orders placed or performed during the hospital encounter of 09/19/17 (from the past 48 hour(s))  Glucose, capillary     Status: None   Collection Time: 10/24/17  6:38 AM  Result Value Ref Range   Glucose-Capillary 92 70 - 99 mg/dL    Blood Alcohol level:  Lab Results  Component Value Date   ETH <5 09/29/2016   ETH <5 91/47/8295    Metabolic Disorder Labs: Lab Results  Component Value Date   HGBA1C 5.7 (H) 09/20/2017   MPG 116.89 09/20/2017   No results found for: PROLACTIN Lab Results  Component Value Date   CHOL 173 09/20/2017   TRIG 193 (H) 09/20/2017   HDL 50 09/20/2017   CHOLHDL 3.5 09/20/2017   VLDL 39 09/20/2017   LDLCALC 84 09/20/2017   LDLCALC 87 09/04/2017    Physical Findings: AIMS: Facial and Oral Movements Muscles of Facial Expression: None, normal Lips and Perioral Area: None, normal Jaw: None, normal Tongue: None, normal,Extremity Movements Upper (arms, wrists, hands, fingers): None, normal Lower (legs, knees, ankles, toes): None, normal, Trunk Movements Neck, shoulders, hips: None, normal, Overall Severity Severity of abnormal movements (highest score from questions above): None,  normal Incapacitation due to abnormal movements: None, normal Patient's awareness of abnormal movements (rate only patient's report): No Awareness, Dental Status Current problems with teeth and/or dentures?: No Does patient usually wear dentures?: No  CIWA:    COWS:     Musculoskeletal: Strength & Muscle Tone: within normal limits Gait & Station: normal Patient leans: N/A  Psychiatric Specialty Exam: Physical Exam  Nursing note and vitals reviewed. Constitutional: She appears well-developed and well-nourished.  HENT:  Head: Normocephalic and atraumatic.  Eyes: Pupils are equal, round, and reactive to light. Conjunctivae are normal.  Neck: Normal range of motion.  Cardiovascular: Regular rhythm and normal heart sounds.  Respiratory: Effort normal. No respiratory distress.  GI: Soft.  Musculoskeletal: Normal range of motion.  Neurological: She is alert.  Skin: Skin is warm and dry.  Psychiatric: Her affect is labile. Her speech is tangential. She is agitated. She is not aggressive. Thought content is not paranoid. Cognition and memory are impaired. She expresses impulsivity. She expresses no homicidal and no suicidal ideation.    Review of Systems  Constitutional: Negative.   HENT: Negative.   Eyes: Negative.   Respiratory: Negative.   Cardiovascular: Negative.   Gastrointestinal: Negative.   Musculoskeletal: Negative.   Skin: Negative.   Neurological: Negative.   Psychiatric/Behavioral: Positive for depression. Negative for hallucinations, memory loss, substance abuse and suicidal ideas. The patient is nervous/anxious and has insomnia.     Blood pressure (!) 147/77, pulse 62, temperature 98.1 F (36.7 C), temperature source Oral, resp. rate 18, height 5\' 3"  (1.6 m), weight 53.1 kg, SpO2 98 %.Body mass index is 20.73 kg/m.  General Appearance: Fairly Groomed  Eye Contact:  Good  Speech:  Normal Rate  Volume:  Normal  Mood:  Dysphoric and Irritable  Affect:  Congruent   Thought Process:  Disorganized  Orientation:  Full (Time, Place, and Person)  Thought Content:  Illogical and Rumination  Suicidal Thoughts:  No  Homicidal Thoughts:  No  Memory:  Immediate;   Fair Recent;   Fair Remote;   Fair  Judgement:  Impaired  Insight:  Shallow  Psychomotor Activity:  Decreased  Concentration:  Concentration: Fair  Recall:  [name]:  Fair  Language:  Fair  Akathisia:  No  Handed:  Right  AIMS (if indicated):     Assets:  Desire for Improvement Resilience  ADL's:  Impaired  Cognition:  Impaired,  Mild  Sleep:  Number of Hours: 5     Treatment Plan Summary: Daily contact with patient to assess and evaluate symptoms and progress in treatment, Medication management and Plan Patient with bipolar disorder and personality disorder continues about the same as usual.  I do think the ECT is helping to control the worst of her behavior although the personality disorder features are still there.  We are really trying to find some kind of way to get her out of this hospital meanwhile however continue current medicine and plan for ECT on Friday.  Alethia Berthold, MD 10/24/2017, 6:46 PM

## 2017-10-24 NOTE — Anesthesia Postprocedure Evaluation (Signed)
Anesthesia Post Note  Patient: Angela [name]  Procedure(s) Performed: ECT TX  Patient location during evaluation: PACU Anesthesia Type: General Level of consciousness: awake and alert and oriented Pain management: pain level controlled Vital Signs Assessment: post-procedure vital signs reviewed and stable Respiratory status: spontaneous breathing Cardiovascular status: blood pressure returned to baseline Anesthetic complications: no     Last Vitals:  Vitals:   10/24/17 1404 10/24/17 1417  BP: (!) 157/79   Pulse: 62 66  Resp:  18  Temp: 36.7 C   SpO2: 100% 98%    Last Pain:  Vitals:   10/24/17 1404  TempSrc: Oral  PainSc:                  [name]

## 2017-10-24 NOTE — Anesthesia Post-op Follow-up Note (Signed)
Anesthesia QCDR form completed.        

## 2017-10-24 NOTE — Progress Notes (Signed)
OT Cancellation Note  Patient Details Name: Angela [name] MRN: [id number] DOB: [birth year]   Cancelled Treatment:    Reason Eval/Treat Not Completed: Other (comment). Chart reviewed, pt noted with plan for ECT today. Will hold OT tx and re-attempt at later date/time as pt is appropriate for therapy.  Jeni Salles, MPH, MS, OTR/L ascom [phone] 10/24/17, 10:38 AM

## 2017-10-24 NOTE — Progress Notes (Signed)
Patient alert and oriented x 4, no distress noted, patient's thoughts are organized and coherent, affect is blunted, appears irritable, demanding and hostile towards staff, denies SI/HI/AVH, compliant with medication, NPO status after midnight for ECT procedure. 15 minutes safety checks maintained will continue to monitor closely.

## 2017-10-24 NOTE — H&P (Signed)
Angela [name] is an 67 y.o. female.   Chief Complaint: No specific new complaint.  Pretty good spirits this morning. HPI: Recurrent long-standing mood instability showing some response with current treatment plan  Past Medical History:  Diagnosis Date  . Allergic rhinitis   . Anemia   . Blurred vision   . Depression   . Diverticulosis   . Endometriosis   . Falls   . GERD (gastroesophageal reflux disease)   . Hernia, hiatal   . Hypothyroidism   . IBS (irritable bowel syndrome)   . Malignant neoplasm of skin   . Migraine   . Neuropathy   . PTSD (post-traumatic stress disorder)   . Thyroid disease     Past Surgical History:  Procedure Laterality Date  . APPENDECTOMY    . CHOLECYSTECTOMY    . CHOLECYSTECTOMY, LAPAROSCOPIC    . ESOPHAGOGASTRODUODENOSCOPY (EGD) WITH PROPOFOL N/A 03/29/2015   Procedure: ESOPHAGOGASTRODUODENOSCOPY (EGD) WITH PROPOFOL;  Surgeon: Josefine Class, MD;  Location: [facility] ENDOSCOPY;  Service: Endoscopy;  Laterality: N/A;  . HERNIA REPAIR    . laproscopy    . TONSILLECTOMY    . uteral suspension      Family History  Problem Relation Age of Onset  . Heart disease Father   . Cancer Mother        lung  . Urolithiasis Neg Hx   . Kidney disease Neg Hx   . Kidney cancer Neg Hx   . Prostate cancer Neg Hx    Social History:  reports that she quit smoking about 40 years ago. She has never used smokeless tobacco. She reports that she does not drink alcohol or use drugs.  Allergies:  Allergies  Allergen Reactions  . Bee Venom Hives  . Darvon [Propoxyphene] Anaphylaxis and Hives    Hives/throat swelling   . Dicyclomine Itching  . Other Hives    Spider bites cause hives  . Oxycodone-Acetaminophen Nausea And Vomiting and Other (See Comments)    hallucinations   . Peanuts [Peanut Oil] Anaphylaxis and Hives  . Penicillins Hives    Has patient had a PCN reaction causing immediate rash, facial/tongue/throat swelling, SOB or lightheadedness with  hypotension: Unknown Has patient had a PCN reaction causing severe rash involving mucus membranes or skin necrosis: Unknown Has patient had a PCN reaction that required hospitalization: Unknown Has patient had a PCN reaction occurring within the last 10 years: Unknown If all of the above answers are "NO", then may proceed with Cephalosporin use.  . Sulfa Antibiotics Anaphylaxis, Hives and Itching  . Sulfacetamide Sodium Anaphylaxis, Hives and Itching  . Ultram [Tramadol] Hives  . Amikacin Nausea And Vomiting  . Aspirin Hives  . Doxycycline Photosensitivity  . Haloperidol Nausea And Vomiting  . Hymenoptera Venom Preparations Hives    Reaction to spider bites  . Imipramine Hives    Reaction to Tofranil  . Keflex [Cephalexin] Hives  . Lactose Intolerance (Gi)   . Sulfur Hives  . Adhesive [Tape] Hives and Rash  . Albuterol Palpitations  . Hydroxyzine Anxiety and Itching  . Prednisone Nausea And Vomiting, Anxiety and Other (See Comments)    Crazy mood swings, strange thoughts (pt does tolerate cortisone injections)    Medications Prior to Admission  Medication Sig Dispense Refill  . amLODipine (NORVASC) 5 MG tablet Take 1 tablet (5 mg total) by mouth 2 (two) times daily. 180 tablet 1  . ANORO ELLIPTA 62.5-25 MCG/INH AEPB Inhale 1 puff into the lungs daily. 14 each 0  .  calcium carbonate (OS-CAL) 600 MG TABS tablet Take 1 tablet (600 mg total) by mouth 2 (two) times daily with a meal. (Patient taking differently: Take 600 mg 3 (three) times daily by mouth. ) 60 tablet 0  . cholecalciferol (VITAMIN D) 1000 units tablet Take 4,000 Units by mouth daily.    . citalopram (CELEXA) 20 MG tablet Take 20 mg by mouth daily.  0  . conjugated estrogens (PREMARIN) vaginal cream Place vaginally 3 (three) times a week. Use 1/2 gram 3-4 times weekly. 42.5 g 4  . cycloSPORINE (RESTASIS) 0.05 % ophthalmic emulsion Place 1 drop into both eyes 2 (two) times daily. 60 each 5  . diclofenac sodium (VOLTAREN) 1  % GEL Apply 2 g topically 4 (four) times daily.    . ferrous sulfate (FERROUSUL) 325 (65 FE) MG tablet Take 1 tablet (325 mg total) by mouth daily with breakfast. 90 tablet 1  . gabapentin (NEURONTIN) 100 MG capsule Take 1-2 capsules (100-200 mg total) by mouth 3 (three) times daily. (Patient taking differently: Take 100 mg by mouth 2 (two) times daily. ) 540 capsule 1  . guaiFENesin (MUCINEX) 600 MG 12 hr tablet Take 1 tablet (600 mg total) by mouth 2 (two) times daily as needed for cough. (Patient taking differently: Take 600 mg by mouth daily. ) 30 tablet 0  . hydrocortisone (PROCTOZONE-HC) 2.5 % rectal cream Place 1 application rectally 2 (two) times daily. 30 g 1  . hydrOXYzine (ATARAX/VISTARIL) 10 MG tablet Take 1 tablet by mouth 2 (two) times daily as needed for anxiety.     Marland Kitchen KRILL OIL OMEGA-3 PO Take 350 mg by mouth daily.    Marland Kitchen levalbuterol (XOPENEX) 1.25 MG/3ML nebulizer solution Inhale 3 mLs into the lungs daily.    Marland Kitchen levothyroxine (SYNTHROID, LEVOTHROID) 100 MCG tablet Take 1 tablet (100 mcg total) by mouth daily before breakfast. 90 tablet 1  . linaclotide (LINZESS) 290 MCG CAPS capsule Take 1 capsule (290 mcg total) by mouth daily before breakfast. 90 capsule 1  . magnesium oxide (MAG-OX) 400 MG tablet Take 400 mg by mouth daily with breakfast.    . montelukast (SINGULAIR) 10 MG tablet Take 1 tablet by mouth at bedtime.  11  . omeprazole (PRILOSEC) 20 MG capsule Take 1 capsule by mouth daily.    Vladimir Faster Glycol-Propyl Glycol (SYSTANE OP) Place 1 drop into both eyes daily as needed (dry eyes).    . polyethylene glycol powder (GLYCOLAX/MIRALAX) powder Take 17-34 g by mouth daily. 1700 g 5  . Probiotic Product (PROBIOTIC PO) Take 1 tablet by mouth daily.    . promethazine (PHENERGAN) 25 MG tablet Take 0.5 tablets (12.5 mg total) by mouth every 6 (six) hours as needed for nausea or vomiting. 30 tablet 0  . Pyridoxine HCl (VITAMIN B-6) 500 MG tablet Take 1 tablet by mouth 2 (two) times  daily.    . ranitidine (ZANTAC) 150 MG tablet Take 150 mg by mouth daily before supper.    Marland Kitchen tiZANidine (ZANAFLEX) 2 MG tablet Take 0.5-1 tablets (1-2 mg total) by mouth 2 (two) times daily as needed for muscle spasms. 180 tablet 1  . tolterodine (DETROL LA) 2 MG 24 hr capsule Take 1 capsule (2 mg total) daily by mouth. 30 capsule 11  . traZODone (DESYREL) 50 MG tablet Take 50 mg by mouth at bedtime.      Results for orders placed or performed during the hospital encounter of 09/19/17 (from the past 48 hour(s))  Glucose, capillary  Status: None   Collection Time: 10/24/17  6:38 AM  Result Value Ref Range   Glucose-Capillary 92 70 - 99 mg/dL   No results found.  Review of Systems  Constitutional: Negative.   HENT: Negative.   Eyes: Negative.   Respiratory: Negative.   Cardiovascular: Negative.   Gastrointestinal: Negative.   Musculoskeletal: Negative.   Skin: Negative.   Neurological: Negative.   Psychiatric/Behavioral: Negative.     Blood pressure 132/77, pulse (!) 52, temperature 98.1 F (36.7 C), temperature source Oral, resp. rate 18, height 5\' 3"  (1.6 m), weight 53.1 kg, SpO2 98 %. Physical Exam  Nursing note and vitals reviewed. Constitutional: She appears well-developed and well-nourished.  HENT:  Head: Normocephalic and atraumatic.  Eyes: Pupils are equal, round, and reactive to light. Conjunctivae are normal.  Neck: Normal range of motion.  Cardiovascular: Regular rhythm and normal heart sounds.  Respiratory: Effort normal. No respiratory distress.  GI: Soft.  Musculoskeletal: Normal range of motion.  Neurological: She is alert.  Skin: Skin is warm and dry.  Psychiatric: Her speech is normal and behavior is normal. Thought content normal. Her affect is blunt. She expresses impulsivity. She exhibits abnormal recent memory.     Assessment/Plan Treatment today and we can continue with 3 times a week while in the hospital to get maximum benefit as she seems to be  tolerating treatment very well.  Alethia Berthold, MD 10/24/2017, 10:41 AM

## 2017-10-24 NOTE — Tx Team (Signed)
Interdisciplinary Treatment and Diagnostic Plan Update  10/15/2017 Time of Session: 10:30am Angela [name] MRN: [id number]  Principal Diagnosis: Bipolar I disorder, most recent episode mixed, severe with psychotic features ([place])  Secondary Diagnoses: Principal Problem:   Bipolar I disorder, most recent episode mixed, severe with psychotic features ([place]) Active Problems:   Hypothyroidism   Essential hypertension   Plantar fasciitis, bilateral   PTSD (post-traumatic stress disorder)   Agitation   Current Medications:  Current Facility-Administered Medications  Medication Dose Route Frequency Provider Last Rate Last Dose  . acetaminophen (TYLENOL) tablet 650 mg  650 mg Oral Q6H PRN Clapacs, Madie Reno, MD   650 mg at 10/24/17 1407  . alum & mag hydroxide-simeth (MAALOX/MYLANTA) 200-200-20 MG/5ML suspension 30 mL  30 mL Oral Q4H PRN Clapacs, John T, MD   30 mL at 10/08/17 1507  . amLODipine (NORVASC) tablet 5 mg  5 mg Oral BID Clapacs, Madie Reno, MD   5 mg at 10/24/17 7829  . calcium carbonate (TUMS - dosed in mg elemental calcium) chewable tablet 400 mg of elemental calcium  2 tablet Oral Q breakfast Pucilowska, Jolanta B, MD   400 mg of elemental calcium at 10/23/17 1309  . cholecalciferol (VITAMIN D) tablet 1,000 Units  1,000 Units Oral Daily Pucilowska, Jolanta B, MD   1,000 Units at 10/23/17 1308  . cycloSPORINE (RESTASIS) 0.05 % ophthalmic emulsion 1 drop  1 drop Both Eyes BID Clapacs, John T, MD   1 drop at 10/24/17 0900  . diclofenac sodium (VOLTAREN) 1 % transdermal gel 4 g  4 g Topical QID Pucilowska, Jolanta B, MD   4 g at 10/24/17 1235  . feeding supplement (BOOST / RESOURCE BREEZE) liquid 1 Container  1 Container Oral TID BM Clapacs, Madie Reno, MD   1 Container at 10/23/17 2100  . fentaNYL (SUBLIMAZE) injection 25 mcg  25 mcg Intravenous Q5 min PRN Alvin Critchley, MD      . ferrous sulfate tablet 325 mg  325 mg Oral Q breakfast Pucilowska, Jolanta B, MD   325 mg at 10/24/17 1234  .  gabapentin (NEURONTIN) capsule 100 mg  100 mg Oral BID Clapacs, Madie Reno, MD   100 mg at 10/24/17 1233  . glycopyrrolate (ROBINUL) injection 0.1 mg  0.1 mg Intravenous Once Clapacs, John T, MD      . hydrocortisone cream 1 %   Topical BID Clapacs, John T, MD      . ipratropium (ATROVENT) 0.03 % nasal spray 2 spray  2 spray Each Nare TID He, Jun, MD   2 spray at 10/24/17 1234  . levalbuterol (XOPENEX) nebulizer solution 0.63 mg  0.63 mg Nebulization Q1400 Clapacs, Madie Reno, MD   0.63 mg at 10/24/17 1416  . levothyroxine (SYNTHROID, LEVOTHROID) tablet 100 mcg  100 mcg Oral QAC breakfast Pucilowska, Jolanta B, MD   100 mcg at 10/24/17 0629  . lidocaine (LIDODERM) 5 % 1 patch  1 patch Transdermal Q24H Pucilowska, Jolanta B, MD   1 patch at 10/24/17 1233  . linaclotide (LINZESS) capsule 290 mcg  290 mcg Oral QAC breakfast Pucilowska, Jolanta B, MD   290 mcg at 10/24/17 0629  . lip balm (BLISTEX) ointment   Topical PRN Clapacs, John T, MD      . loperamide (IMODIUM) capsule 2 mg  2 mg Oral PRN Pucilowska, Jolanta B, MD   2 mg at 10/18/17 0002  . LORazepam (ATIVAN) tablet 0.5 mg  0.5 mg Oral Q6H PRN Clapacs, Madie Reno, MD  0.5 mg at 10/20/17 2134  . magnesium hydroxide (MILK OF MAGNESIA) suspension 30 mL  30 mL Oral Daily PRN Clapacs, Madie Reno, MD   30 mL at 10/05/17 1731  . magnesium oxide (MAG-OX) tablet 200 mg  200 mg Oral BID Pucilowska, Jolanta B, MD   200 mg at 10/24/17 1235  . midazolam (VERSED) injection 2 mg  2 mg Intravenous Once Clapacs, John T, MD      . multivitamin with minerals tablet 1 tablet  1 tablet Oral Daily Pucilowska, Jolanta B, MD   1 tablet at 10/24/17 1234  . omega-3 acid ethyl esters (LOVAZA) capsule 1 g  1 g Oral BID Pucilowska, Jolanta B, MD   1 g at 10/24/17 1236  . ondansetron (ZOFRAN) injection 4 mg  4 mg Intravenous Once Clapacs, John T, MD      . ondansetron [facility]) injection 4 mg  4 mg Intravenous Once PRN Gunnar Fusi, MD      . ondansetron [facility]) injection 4 mg  4 mg  Intravenous Once PRN Gunnar Bulla, MD      . ondansetron [facility]) injection 4 mg  4 mg Intravenous Once PRN Alvin Critchley, MD      . ondansetron [facility]) injection 4 mg  4 mg Intravenous Once Clapacs, John T, MD      . ondansetron [facility]) injection 4 mg  4 mg Intravenous Once PRN Alvin Critchley, MD      . ondansetron (ZOFRAN-ODT) disintegrating tablet 4 mg  4 mg Oral Q8H PRN Pucilowska, Jolanta B, MD   4 mg at 10/08/17 1807  . PARoxetine (PAXIL-CR) 24 hr tablet 25 mg  25 mg Oral Daily Clapacs, John T, MD   25 mg at 10/24/17 1235  . polyethylene glycol (MIRALAX / GLYCOLAX) packet 17 g  17 g Oral Daily Pucilowska, Jolanta B, MD   17 g at 10/23/17 1318  . polyvinyl alcohol (LIQUIFILM TEARS) 1.4 % ophthalmic solution 1 drop  1 drop Both Eyes PRN Ramond Dial, MD   1 drop at 10/24/17 1338  . prazosin (MINIPRESS) capsule 2 mg  2 mg Oral QHS Clapacs, Madie Reno, MD   2 mg at 10/23/17 2254  . promethazine (PHENERGAN) tablet 12.5 mg  12.5 mg Oral Q6H PRN Clapacs, John T, MD   12.5 mg at 10/24/17 1234  . pyridOXINE (VITAMIN B-6) tablet 100 mg  100 mg Oral BID Clapacs, Madie Reno, MD   100 mg at 10/24/17 1236  . tiZANidine (ZANAFLEX) tablet 2 mg  2 mg Oral Q8H PRN Pucilowska, Jolanta B, MD   2 mg at 10/21/17 1600  . traZODone (DESYREL) tablet 100 mg  100 mg Oral QHS Clapacs, John T, MD   100 mg at 10/23/17 2255  . triamcinolone cream (KENALOG) 0.1 %   Topical TID PRN Ramond Dial, MD   1 application at 02/58/52 2106  . vitamin C (ASCORBIC ACID) tablet 1,000 mg  1,000 mg Oral Daily Pucilowska, Jolanta B, MD   1,000 mg at 10/24/17 1236   PTA Medications: Medications Prior to Admission  Medication Sig Dispense Refill Last Dose  . amLODipine (NORVASC) 5 MG tablet Take 1 tablet (5 mg total) by mouth 2 (two) times daily. 180 tablet 1 10/09/2017 at Unknown time  . ANORO ELLIPTA 62.5-25 MCG/INH AEPB Inhale 1 puff into the lungs daily. 14 each 0 10/09/2017 at Unknown time  . calcium carbonate (OS-CAL) 600 MG  TABS tablet Take 1 tablet (600 mg total) by mouth 2 (two) times  daily with a meal. (Patient taking differently: Take 600 mg 3 (three) times daily by mouth. ) 60 tablet 0 10/09/2017 at Unknown time  . cholecalciferol (VITAMIN D) 1000 units tablet Take 4,000 Units by mouth daily.   10/09/2017 at Unknown time  . citalopram (CELEXA) 20 MG tablet Take 20 mg by mouth daily.  0 10/09/2017 at Unknown time  . conjugated estrogens (PREMARIN) vaginal cream Place vaginally 3 (three) times a week. Use 1/2 gram 3-4 times weekly. 42.5 g 4 10/09/2017 at Unknown time  . cycloSPORINE (RESTASIS) 0.05 % ophthalmic emulsion Place 1 drop into both eyes 2 (two) times daily. 60 each 5 10/09/2017 at Unknown time  . diclofenac sodium (VOLTAREN) 1 % GEL Apply 2 g topically 4 (four) times daily.   10/09/2017 at Unknown time  . ferrous sulfate (FERROUSUL) 325 (65 FE) MG tablet Take 1 tablet (325 mg total) by mouth daily with breakfast. 90 tablet 1 10/09/2017 at Unknown time  . gabapentin (NEURONTIN) 100 MG capsule Take 1-2 capsules (100-200 mg total) by mouth 3 (three) times daily. (Patient taking differently: Take 100 mg by mouth 2 (two) times daily. ) 540 capsule 1 10/09/2017 at Unknown time  . guaiFENesin (MUCINEX) 600 MG 12 hr tablet Take 1 tablet (600 mg total) by mouth 2 (two) times daily as needed for cough. (Patient taking differently: Take 600 mg by mouth daily. ) 30 tablet 0 10/09/2017 at Unknown time  . hydrocortisone (PROCTOZONE-HC) 2.5 % rectal cream Place 1 application rectally 2 (two) times daily. 30 g 1 10/09/2017 at Unknown time  . hydrOXYzine (ATARAX/VISTARIL) 10 MG tablet Take 1 tablet by mouth 2 (two) times daily as needed for anxiety.    10/09/2017 at Unknown time  . KRILL OIL OMEGA-3 PO Take 350 mg by mouth daily.   10/09/2017 at Unknown time  . levalbuterol (XOPENEX) 1.25 MG/3ML nebulizer solution Inhale 3 mLs into the lungs daily.   10/09/2017 at Unknown time  . levothyroxine (SYNTHROID, LEVOTHROID) 100 MCG tablet Take 1  tablet (100 mcg total) by mouth daily before breakfast. 90 tablet 1 10/09/2017 at Unknown time  . linaclotide (LINZESS) 290 MCG CAPS capsule Take 1 capsule (290 mcg total) by mouth daily before breakfast. 90 capsule 1 10/09/2017 at Unknown time  . magnesium oxide (MAG-OX) 400 MG tablet Take 400 mg by mouth daily with breakfast.   10/09/2017 at Unknown time  . montelukast (SINGULAIR) 10 MG tablet Take 1 tablet by mouth at bedtime.  11 10/09/2017 at Unknown time  . omeprazole (PRILOSEC) 20 MG capsule Take 1 capsule by mouth daily.   10/09/2017 at Unknown time  . Polyethyl Glycol-Propyl Glycol (SYSTANE OP) Place 1 drop into both eyes daily as needed (dry eyes).   10/09/2017 at Unknown time  . polyethylene glycol powder (GLYCOLAX/MIRALAX) powder Take 17-34 g by mouth daily. 1700 g 5 10/09/2017 at Unknown time  . Probiotic Product (PROBIOTIC PO) Take 1 tablet by mouth daily.   10/09/2017 at Unknown time  . promethazine (PHENERGAN) 25 MG tablet Take 0.5 tablets (12.5 mg total) by mouth every 6 (six) hours as needed for nausea or vomiting. 30 tablet 0 10/09/2017 at Unknown time  . Pyridoxine HCl (VITAMIN B-6) 500 MG tablet Take 1 tablet by mouth 2 (two) times daily.   10/09/2017 at Unknown time  . ranitidine (ZANTAC) 150 MG tablet Take 150 mg by mouth daily before supper.   10/09/2017 at Unknown time  . tiZANidine (ZANAFLEX) 2 MG tablet Take 0.5-1 tablets (1-2 mg  total) by mouth 2 (two) times daily as needed for muscle spasms. 180 tablet 1 10/09/2017 at Unknown time  . tolterodine (DETROL LA) 2 MG 24 hr capsule Take 1 capsule (2 mg total) daily by mouth. 30 capsule 11 10/09/2017 at Unknown time  . traZODone (DESYREL) 50 MG tablet Take 50 mg by mouth at bedtime.   10/09/2017 at Unknown time    Patient Stressors: Financial difficulties Health problems Traumatic event Other: "Pain" "fear" "trauma" "homeless"  Patient Strengths: Ability for insight Communication skills General fund of knowledge Motivation for  treatment/growth  Treatment Modalities: Medication Management, Group therapy, Case management,  1 to 1 session with clinician, Psychoeducation, Recreational therapy.   Physician Treatment Plan for Primary Diagnosis: Bipolar I disorder, most recent episode mixed, severe with psychotic features ([place]) Long Term Goal(s): Improvement in symptoms so as ready for discharge Improvement in symptoms so as ready for discharge   Short Term Goals: Ability to identify changes in lifestyle to reduce recurrence of condition will improve Ability to verbalize feelings will improve Ability to disclose and discuss suicidal ideas Ability to demonstrate self-control will improve Ability to identify and develop effective coping behaviors will improve Ability to maintain clinical measurements within normal limits will improve Compliance with prescribed medications will improve Ability to identify triggers associated with substance abuse/mental health issues will improve NA  Medication Management: Evaluate patient's response, side effects, and tolerance of medication regimen.  Therapeutic Interventions: 1 to 1 sessions, Unit Group sessions and Medication administration.  Evaluation of Outcomes: Progressing  Physician Treatment Plan for Secondary Diagnosis: Principal Problem:   Bipolar I disorder, most recent episode mixed, severe with psychotic features ([place]) Active Problems:   Hypothyroidism   Essential hypertension   Plantar fasciitis, bilateral   PTSD (post-traumatic stress disorder)   Agitation  Long Term Goal(s): Improvement in symptoms so as ready for discharge Improvement in symptoms so as ready for discharge   Short Term Goals: Ability to identify changes in lifestyle to reduce recurrence of condition will improve Ability to verbalize feelings will improve Ability to disclose and discuss suicidal ideas Ability to demonstrate self-control will improve Ability to identify and develop effective  coping behaviors will improve Ability to maintain clinical measurements within normal limits will improve Compliance with prescribed medications will improve Ability to identify triggers associated with substance abuse/mental health issues will improve NA     Medication Management: Evaluate patient's response, side effects, and tolerance of medication regimen.  Therapeutic Interventions: 1 to 1 sessions, Unit Group sessions and Medication administration.  Evaluation of Outcomes: Progressing   RN Treatment Plan for Primary Diagnosis: Bipolar I disorder, most recent episode mixed, severe with psychotic features ([place]) Long Term Goal(s): Knowledge of disease and therapeutic regimen to maintain health will improve  Short Term Goals: Ability to identify and develop effective coping behaviors will improve and Compliance with prescribed medications will improve  Medication Management: RN will administer medications as ordered by provider, will assess and evaluate patient's response and provide education to patient for prescribed medication. RN will report any adverse and/or side effects to prescribing provider.  Therapeutic Interventions: 1 on 1 counseling sessions, Psychoeducation, Medication administration, Evaluate responses to treatment, Monitor vital signs and CBGs as ordered, Perform/monitor CIWA, COWS, AIMS and Fall Risk screenings as ordered, Perform wound care treatments as ordered.  Evaluation of Outcomes: Progressing   LCSW Treatment Plan for Primary Diagnosis: Bipolar I disorder, most recent episode mixed, severe with psychotic features ([place]) Long Term Goal(s): Safe transition to appropriate  next level of care at discharge, Engage patient in therapeutic group addressing interpersonal concerns.  Short Term Goals: Engage patient in aftercare planning with referrals and resources, Increase social support and Increase skills for wellness and recovery  Therapeutic Interventions: Assess  for all discharge needs, 1 to 1 time with Social worker, Explore available resources and support systems, Assess for adequacy in community support network, Educate family and significant other(s) on suicide prevention, Complete Psychosocial Assessment, Interpersonal group therapy.  Evaluation of Outcomes: Progressing   Progress in Treatment: Attending groups: Yes. Participating in groups: Yes. Taking medication as prescribed: Yes. Toleration medication: Yes. Family/Significant other contact made: No, will contact:  pt declined consent Patient understands diagnosis: Yes. Discussing patient identified problems/goals with staff: Yes. Medical problems stabilized or resolved: Yes. Denies suicidal/homicidal ideation: Yes. Issues/concerns per patient self-inventory: No. Other: none  New problem(s) identified: No, Describe:  none  New Short Term/Long Term Goal(s):  Patient Goals: Currently receiving ECT treatment  Discharge Plan or Barriers: TBD.  Pt is making some progress with ECT, but still quite irritable and demanding. Barrier to discharge has been finding pt an appropriate place to discharge to.  CSW has contacted several mental health facilities/grouphomes, but there have not been any open beds.  Several low-income apartments have also been contacted with long waitlists.  Other placement/residential options will be explored.  Reason for Continuation of Hospitalization: Depression, irritability, disorganization, refusing medications at times. Pt needs to continue ECT.  On-going efforts to secure appropriate d/c placement.  Estimated Length of Stay: 5-7 days.  Attendees: Patient:   Physician: Alethia Berthold, MD  10/24/2017  Nursing:   RN Care Manager:   Social Worker:Mylasia Vorhees LCSW 10/24/2017  Recreational Therapist: Roanna Epley, LRT 10/24/2017  Other: Darin Engels, [place] 10/24/2017  Other:    Other:     Scribe for Treatment Team: August Saucer, LCSW 10/24/2017 2:45 PM

## 2017-10-25 NOTE — Progress Notes (Signed)
Recreation Therapy Notes  Date: 10/25/2017  Time: 9:30 am   Location: Craft room   Behavioral response: N/A   Intervention Topic: Communication  Discussion/Intervention: Patient did not attend group.   Clinical Observations/Feedback:  Patient did not attend group.        Eleisha Branscomb 10/25/2017 10:40 AM

## 2017-10-25 NOTE — [facility] Group Notes (Signed)
LCSW Group Therapy Note  10/25/2017 1:15pm  Type of Therapy/Topic:  Group Therapy:  Balance in Life  Participation Level:  Did Not Attend  Description of Group:    This group will address the concept of balance and how it feels and looks when one is unbalanced. Patients will be encouraged to process areas in their lives that are out of balance and identify reasons for remaining unbalanced. Facilitators will guide patients in utilizing problem-solving interventions to address and correct the stressor making their life unbalanced. Understanding and applying boundaries will be explored and addressed for obtaining and maintaining a balanced life. Patients will be encouraged to explore ways to assertively make their unbalanced needs known to significant others in their lives, using other group members and facilitator for support and feedback.  Therapeutic Goals: 1. Patient will identify two or more emotions or situations they have that consume much of in their lives. 2. Patient will identify signs/triggers that life has become out of balance:  3. Patient will identify two ways to set boundaries in order to achieve balance in their lives:  4. Patient will demonstrate ability to communicate their needs through discussion and/or role plays  Summary of Patient Progress:  Patient came in at the end of group and requested additional 1-1 time. LCSW stated that this would need to be approved LCSW encouraged patient to reflect that a group home is a good starting point in the community and she could leave daily to look at independent apartments. She will consider it.     Therapeutic Modalities:   Cognitive Behavioral Therapy Solution-Focused Therapy Assertiveness Training  Joana Reamer, [place] 10/25/2017 2:07 PM

## 2017-10-25 NOTE — Progress Notes (Signed)
Patient alert and oriented x 4, no distress noted, patient's thoughts are organized and coherent, affect is blunted, appears irritable very angry and demanding, upset at security staff and nursing staff, she stated " everyone is ganging up against me" denies SI/HI/AVH, compliant with medication, status post/ECT procedure no memory loss noted, interacting appropriately with peers and staff. 15 minutes safety checks maintained will continue to monitor closely.

## 2017-10-25 NOTE — Progress Notes (Signed)
[facility] MD Progress Note  10/25/2017 7:01 PM Angela [name]  MRN:  [id number] Subjective: Follow-up for this patient with chronic mood lability.  Patient continues to have intermittent depression and anxiety mood lability multiple somatic complaints.  Not grossly psychotic.  No complaints about her ECT. Principal Problem: Bipolar I disorder, most recent episode mixed, severe with psychotic features ([place]) Diagnosis:   Patient Active Problem List   Diagnosis Date Noted  . Bipolar I disorder, most recent episode mixed, severe with psychotic features ([place]) [F31.64] 09/18/2017  . Agitation [R45.1] 09/18/2017  . IBS (irritable bowel syndrome) [K58.9] 11/17/2016  . Bilateral carpal tunnel syndrome [G56.03] 11/17/2016  . Plantar fasciitis, bilateral [M72.2] 11/17/2016  . Chronic pain syndrome [G89.4] 11/17/2016  . Chronic hyponatremia [E87.1] 11/17/2016  . PTSD (post-traumatic stress disorder) [F43.10] 11/17/2016  . Hernia, hiatal [K44.9] 11/17/2016  . Vitamin D deficiency [E55.9] 11/17/2016  . Vitamin B12 deficiency [E53.8] 11/17/2016  . SIADH (syndrome of inappropriate ADH production) ([place]) [E22.2] 09/08/2016  . Cervical spondylosis with myelopathy [M47.12] 05/24/2016  . Post-concussion headache [G44.309] 05/24/2016  . MCI (mild cognitive impairment) with memory loss [G31.84] 03/30/2016  . Gait abnormality [R26.9] 03/30/2016  . Chronic bipolar affective disorder ([place]) [F31.9] 01/24/2016  . Anemia, iron deficiency [D50.9] 06/24/2015  . Endometriosis [N80.9] 06/24/2015  . Essential hypertension [I10] 06/24/2015  . History of postmenopausal bleeding [Z87.42] 05/22/2015  . History of TIA (transient ischemic attack) [Z86.73] 04/20/2015  . Hypothyroidism [E03.9] 04/20/2015  . GERD (gastroesophageal reflux disease) [K21.9] 04/20/2015  . Cervical disc disorder with radiculopathy [M50.10] 03/25/2015  . Chronic neck pain [M54.2, G89.29] 03/25/2015  . Pelvic pain in female [R10.2] 10/30/2014  .  History of skin cancer [Z85.828] 06/30/2014  . Chronic headaches [R51] 12/16/2013  . Neuropathy [G62.9] 12/16/2013   Total Time spent with patient: 15 minutes  Past Psychiatric History: Long history of chronic severe mental illness  Past Medical History:  Past Medical History:  Diagnosis Date  . Allergic rhinitis   . Anemia   . Blurred vision   . Depression   . Diverticulosis   . Endometriosis   . Falls   . GERD (gastroesophageal reflux disease)   . Hernia, hiatal   . Hypothyroidism   . IBS (irritable bowel syndrome)   . Malignant neoplasm of skin   . Migraine   . Neuropathy   . PTSD (post-traumatic stress disorder)   . Thyroid disease     Past Surgical History:  Procedure Laterality Date  . APPENDECTOMY    . CHOLECYSTECTOMY    . CHOLECYSTECTOMY, LAPAROSCOPIC    . ESOPHAGOGASTRODUODENOSCOPY (EGD) WITH PROPOFOL N/A 03/29/2015   Procedure: ESOPHAGOGASTRODUODENOSCOPY (EGD) WITH PROPOFOL;  Surgeon: Josefine Class, MD;  Location[facility] ENDOSCOPY;  Service: Endoscopy;  Laterality: N/A;  . HERNIA REPAIR    . laproscopy    . TONSILLECTOMY    . uteral suspension     Family History:  Family History  Problem Relation Age of Onset  . Heart disease Father   . Cancer Mother        lung  . Urolithiasis Neg Hx   . Kidney disease Neg Hx   . Kidney cancer Neg Hx   . Prostate cancer Neg Hx    Family Psychiatric  History: See previous note Social History:  Social History   Substance and Sexual Activity  Alcohol Use No     Social History   Substance and Sexual Activity  Drug Use No    Social History   Socioeconomic  History  . Marital status: Single    Spouse name: Not on file  . Number of children: 0  . Years of education: 1.5 years of college  . Highest education level: Not on file  Occupational History  . Occupation: Retired  Scientific laboratory technician  . Financial resource strain: Not on file  . Food insecurity:    Worry: Not on file    Inability: Not on file  .  Transportation needs:    Medical: Not on file    Non-medical: Not on file  Tobacco Use  . Smoking status: Former Smoker    Last attempt to quit: 11/17/1976    Years since quitting: 40.9  . Smokeless tobacco: Never Used  Substance and Sexual Activity  . Alcohol use: No  . Drug use: No  . Sexual activity: Not Currently  Lifestyle  . Physical activity:    Days per week: Not on file    Minutes per session: Not on file  . Stress: Not on file  Relationships  . Social connections:    Talks on phone: Not on file    Gets together: Not on file    Attends religious service: Not on file    Active member of club or organization: Not on file    Attends meetings of clubs or organizations: Not on file    Relationship status: Not on file  Other Topics Concern  . Not on file  Social History Narrative   Lives at home alone.   Right-handed.   No daily caffeine use.   Additional Social History:    Pain Medications: See PTA Prescriptions: See PTA Over the Counter: See PTA History of alcohol / drug use?: No history of alcohol / drug abuse Longest period of sobriety (when/how long): Reports of none Negative Consequences of Use: (n/a) Withdrawal Symptoms: (n/a)                    Sleep: Fair  Appetite:  Fair  Current Medications: Current Facility-Administered Medications  Medication Dose Route Frequency Provider Last Rate Last Dose  . acetaminophen (TYLENOL) tablet 650 mg  650 mg Oral Q6H PRN Clapacs, Madie Reno, MD   650 mg at 10/25/17 1847  . alum & mag hydroxide-simeth (MAALOX/MYLANTA) 200-200-20 MG/5ML suspension 30 mL  30 mL Oral Q4H PRN Clapacs, John T, MD   30 mL at 10/08/17 1507  . amLODipine (NORVASC) tablet 5 mg  5 mg Oral BID Clapacs, Madie Reno, MD   5 mg at 10/25/17 1838  . calcium carbonate (TUMS - dosed in mg elemental calcium) chewable tablet 400 mg of elemental calcium  2 tablet Oral Q breakfast Pucilowska, Jolanta B, MD   400 mg of elemental calcium at 10/23/17 1309  .  cholecalciferol (VITAMIN D) tablet 1,000 Units  1,000 Units Oral Daily Pucilowska, Jolanta B, MD   1,000 Units at 10/25/17 1218  . cycloSPORINE (RESTASIS) 0.05 % ophthalmic emulsion 1 drop  1 drop Both Eyes BID Clapacs, Madie Reno, MD   1 drop at 10/24/17 2100  . diclofenac sodium (VOLTAREN) 1 % transdermal gel 4 g  4 g Topical QID Pucilowska, Jolanta B, MD   4 g at 10/25/17 1839  . feeding supplement (BOOST / RESOURCE BREEZE) liquid 1 Container  1 Container Oral TID BM Clapacs, Madie Reno, MD   1 Container at 10/25/17 1500  . fentaNYL (SUBLIMAZE) injection 25 mcg  25 mcg Intravenous Q5 min PRN Alvin Critchley, MD      .  ferrous sulfate tablet 325 mg  325 mg Oral Q breakfast Pucilowska, Jolanta B, MD   325 mg at 10/25/17 1218  . gabapentin (NEURONTIN) capsule 100 mg  100 mg Oral BID Clapacs, Madie Reno, MD   100 mg at 10/25/17 1838  . glycopyrrolate (ROBINUL) injection 0.1 mg  0.1 mg Intravenous Once Clapacs, John T, MD      . hydrocortisone cream 1 %   Topical BID Clapacs, John T, MD      . ipratropium (ATROVENT) 0.03 % nasal spray 2 spray  2 spray Each Nare TID He, Jun, MD   2 spray at 10/25/17 1839  . levalbuterol (XOPENEX) nebulizer solution 0.63 mg  0.63 mg Nebulization Q1400 Clapacs, Madie Reno, MD   0.63 mg at 10/24/17 1416  . levothyroxine (SYNTHROID, LEVOTHROID) tablet 100 mcg  100 mcg Oral QAC breakfast Pucilowska, Jolanta B, MD   100 mcg at 10/25/17 1218  . lidocaine (LIDODERM) 5 % 1 patch  1 patch Transdermal Q24H Pucilowska, Jolanta B, MD   1 patch at 10/24/17 1233  . linaclotide (LINZESS) capsule 290 mcg  290 mcg Oral QAC breakfast Pucilowska, Jolanta B, MD   290 mcg at 10/24/17 0629  . lip balm (BLISTEX) ointment   Topical PRN Clapacs, John T, MD      . loperamide (IMODIUM) capsule 2 mg  2 mg Oral PRN Pucilowska, Jolanta B, MD   2 mg at 10/18/17 0002  . LORazepam (ATIVAN) tablet 0.5 mg  0.5 mg Oral Q6H PRN Clapacs, Madie Reno, MD   0.5 mg at 10/20/17 2134  . magnesium hydroxide (MILK OF MAGNESIA)  suspension 30 mL  30 mL Oral Daily PRN Clapacs, Madie Reno, MD   30 mL at 10/05/17 1731  . magnesium oxide (MAG-OX) tablet 200 mg  200 mg Oral BID Pucilowska, Jolanta B, MD   200 mg at 10/25/17 1837  . midazolam (VERSED) injection 2 mg  2 mg Intravenous Once Clapacs, John T, MD      . multivitamin with minerals tablet 1 tablet  1 tablet Oral Daily Pucilowska, Jolanta B, MD   1 tablet at 10/25/17 1217  . omega-3 acid ethyl esters (LOVAZA) capsule 1 g  1 g Oral BID Pucilowska, Jolanta B, MD   1 g at 10/25/17 1837  . ondansetron (ZOFRAN) injection 4 mg  4 mg Intravenous Once Clapacs, John T, MD      . ondansetron [facility]) injection 4 mg  4 mg Intravenous Once PRN Gunnar Fusi, MD      . ondansetron [facility]) injection 4 mg  4 mg Intravenous Once PRN Gunnar Bulla, MD      . ondansetron [facility]) injection 4 mg  4 mg Intravenous Once PRN Alvin Critchley, MD      . ondansetron [facility]) injection 4 mg  4 mg Intravenous Once Clapacs, John T, MD      . ondansetron [facility]) injection 4 mg  4 mg Intravenous Once PRN Alvin Critchley, MD      . ondansetron (ZOFRAN-ODT) disintegrating tablet 4 mg  4 mg Oral Q8H PRN Pucilowska, Jolanta B, MD   4 mg at 10/08/17 1807  . PARoxetine (PAXIL-CR) 24 hr tablet 25 mg  25 mg Oral Daily Clapacs, Madie Reno, MD   25 mg at 10/25/17 1217  . polyethylene glycol (MIRALAX / GLYCOLAX) packet 17 g  17 g Oral Daily Pucilowska, Jolanta B, MD   17 g at 10/25/17 1222  . polyvinyl alcohol (LIQUIFILM TEARS) 1.4 % ophthalmic solution  1 drop  1 drop Both Eyes PRN Ramond Dial, MD   1 drop at 10/25/17 1840  . prazosin (MINIPRESS) capsule 2 mg  2 mg Oral QHS Clapacs, Madie Reno, MD   2 mg at 10/24/17 2215  . promethazine (PHENERGAN) tablet 12.5 mg  12.5 mg Oral Q6H PRN Clapacs, John T, MD   12.5 mg at 10/24/17 1234  . pyridOXINE (VITAMIN B-6) tablet 100 mg  100 mg Oral BID Clapacs, Madie Reno, MD   100 mg at 10/25/17 1837  . tiZANidine (ZANAFLEX) tablet 2 mg  2 mg Oral Q8H PRN Pucilowska, Jolanta B,  MD   2 mg at 10/21/17 1600  . traZODone (DESYREL) tablet 100 mg  100 mg Oral QHS Clapacs, John T, MD   100 mg at 10/24/17 2215  . triamcinolone cream (KENALOG) 0.1 %   Topical TID PRN Ramond Dial, MD   1 application at 82/42/35 2106  . vitamin C (ASCORBIC ACID) tablet 1,000 mg  1,000 mg Oral Daily Pucilowska, Jolanta B, MD   1,000 mg at 10/25/17 1216    Lab Results:  Results for orders placed or performed during the hospital encounter of 09/19/17 (from the past 48 hour(s))  Glucose, capillary     Status: None   Collection Time: 10/24/17  6:38 AM  Result Value Ref Range   Glucose-Capillary 92 70 - 99 mg/dL    Blood Alcohol level:  Lab Results  Component Value Date   ETH <5 09/29/2016   ETH <5 36/14/4315    Metabolic Disorder Labs: Lab Results  Component Value Date   HGBA1C 5.7 (H) 09/20/2017   MPG 116.89 09/20/2017   No results found for: PROLACTIN Lab Results  Component Value Date   CHOL 173 09/20/2017   TRIG 193 (H) 09/20/2017   HDL 50 09/20/2017   CHOLHDL 3.5 09/20/2017   VLDL 39 09/20/2017   LDLCALC 84 09/20/2017   LDLCALC 87 09/04/2017    Physical Findings: AIMS: Facial and Oral Movements Muscles of Facial Expression: None, normal Lips and Perioral Area: None, normal Jaw: None, normal Tongue: None, normal,Extremity Movements Upper (arms, wrists, hands, fingers): None, normal Lower (legs, knees, ankles, toes): None, normal, Trunk Movements Neck, shoulders, hips: None, normal, Overall Severity Severity of abnormal movements (highest score from questions above): None, normal Incapacitation due to abnormal movements: None, normal Patient's awareness of abnormal movements (rate only patient's report): No Awareness, Dental Status Current problems with teeth and/or dentures?: No Does patient usually wear dentures?: No  CIWA:    COWS:     Musculoskeletal: Strength & Muscle Tone: within normal limits Gait & Station: normal Patient leans: N/A  Psychiatric  Specialty Exam: Physical Exam  Nursing note and vitals reviewed. Constitutional: She appears well-developed and well-nourished.  HENT:  Head: Normocephalic and atraumatic.  Eyes: Pupils are equal, round, and reactive to light. Conjunctivae are normal.  Neck: Normal range of motion.  Cardiovascular: Regular rhythm and normal heart sounds.  Respiratory: Effort normal. No respiratory distress.  GI: Soft.  Musculoskeletal: Normal range of motion.  Neurological: She is alert.  Skin: Skin is warm and dry.  Psychiatric: Her affect is labile. Her speech is tangential. She is agitated. She is not aggressive. Thought content is not paranoid. She expresses impulsivity. She exhibits abnormal recent memory.    Review of Systems  Constitutional: Negative.   HENT: Negative.   Eyes: Negative.   Respiratory: Negative.   Cardiovascular: Negative.   Gastrointestinal: Negative.   Musculoskeletal: Negative.   Skin:  Negative.   Neurological: Negative.   Psychiatric/Behavioral: Positive for depression. Negative for hallucinations, memory loss, substance abuse and suicidal ideas. The patient is nervous/anxious. The patient does not have insomnia.     Blood pressure 120/67, pulse 65, temperature 97.8 F (36.6 C), temperature source Oral, resp. rate 18, height 5\' 3"  (1.6 m), weight 53.1 kg, SpO2 95 %.Body mass index is 20.73 kg/m.  General Appearance: Casual  Eye Contact:  Good  Speech:  Normal Rate  Volume:  Increased  Mood:  Irritable  Affect:  Congruent  Thought Process:  Disorganized  Orientation:  Full (Time, Place, and Person)  Thought Content:  Logical  Suicidal Thoughts:  No  Homicidal Thoughts:  No  Memory:  Immediate;   Fair Recent;   Fair Remote;   Fair  Judgement:  Impaired  Insight:  Shallow  Psychomotor Activity:  Decreased  Concentration:  Concentration: Poor  Recall:  [name]:  Fair  Language:  Fair  Akathisia:  No  Handed:  Right  AIMS (if indicated):      Assets:  Desire for Improvement  ADL's:  Intact  Cognition:  WNL  Sleep:  Number of Hours: 2.75     Treatment Plan Summary: Daily contact with patient to assess and evaluate symptoms and progress in treatment, Medication management and Plan Patient continues to refuse to accept mood stabilizing medicine although she has accepted ECT.  She is probably about as stable as she is likely to get.  We are trying to work on discharge planning.  Patient has made this very difficult.  No stable place to live whatsoever right now that I know of.  No change to orders for today.  Alethia Berthold, MD 10/25/2017, 7:01 PM

## 2017-10-25 NOTE — Plan of Care (Signed)
Patient has the ability to make informed decisions regarding her care and has been present in the milieu throughout the afternoon after she woke up.  Patient has the ability to identify the available resources that can assist her in meeting her health-care needs, in which her goal continues to be finding appropriate housing so that she can be discharged. Patient was compliant with her prescribed medications without any issues. Patient denies SI/HI/AVH as well as depression and anxiety at this time. Patient remains safe on the unit at this time.  Problem: Education: Goal: Ability to make informed decisions regarding treatment will improve Outcome: Progressing   Problem: Coping: Goal: Coping ability will improve Outcome: Progressing   Problem: Health Behavior/Discharge Planning: Goal: Identification of resources available to assist in meeting health care needs will improve Outcome: Progressing   Problem: Medication: Goal: Compliance with prescribed medication regimen will improve Outcome: Progressing   Problem: Self-Concept: Goal: Ability to disclose and discuss suicidal ideas will improve Outcome: Progressing Goal: Will verbalize positive feelings about self Outcome: Progressing   Problem: Safety: Goal: Ability to remain free from injury will improve Outcome: Progressing   Problem: Spiritual Needs Goal: Ability to function at adequate level Outcome: Progressing

## 2017-10-25 NOTE — Progress Notes (Signed)
D- Patient alert and oriented. Patient presents in a pleasant mood on assessment stating that she slept well last night. Patient had no major issues to voice to this Probation officer. Patient denies SI, HI, AVH, and pain at the time of assessment. Patient also denies and has not showed any signs of depression or anxiety at this time. Patient's goal for today continues to be getting her housing and discharge planning in order.  A- Scheduled medications administered to patient, per MD orders. Support and encouragement provided.  Routine safety checks conducted every 15 minutes.  Patient informed to notify staff with problems or concerns.  R- No adverse drug reactions noted. Patient contracts for safety at this time. Patient compliant with medications and treatment plan. Patient receptive, calm, and cooperative. Patient interacts well with others on the unit.  Patient remains safe at this time.

## 2017-10-26 ENCOUNTER — Inpatient Hospital Stay: Payer: Medicare Other | Admitting: Certified Registered Nurse Anesthetist

## 2017-10-26 ENCOUNTER — Other Ambulatory Visit: Payer: Self-pay | Admitting: Psychiatry

## 2017-10-26 ENCOUNTER — Inpatient Hospital Stay: Payer: Medicare Other

## 2017-10-26 LAB — GLUCOSE, CAPILLARY: Glucose-Capillary: 79 mg/dL (ref 70–99)

## 2017-10-26 MED ORDER — KETAMINE HCL 50 MG/ML IJ SOLN
INTRAMUSCULAR | Status: AC
  Filled 2017-10-26: qty 10

## 2017-10-26 MED ORDER — SUCCINYLCHOLINE CHLORIDE 20 MG/ML IJ SOLN
INTRAMUSCULAR | Status: DC | PRN
  Administered 2017-10-26: 80 mg via INTRAVENOUS

## 2017-10-26 MED ORDER — KETAMINE HCL 10 MG/ML IJ SOLN
INTRAMUSCULAR | Status: DC | PRN
  Administered 2017-10-26: 50 mg via INTRAVENOUS

## 2017-10-26 MED ORDER — KETOROLAC TROMETHAMINE 30 MG/ML IJ SOLN
INTRAMUSCULAR | Status: AC
  Filled 2017-10-26: qty 1

## 2017-10-26 MED ORDER — KETOROLAC TROMETHAMINE 30 MG/ML IJ SOLN
30.0000 mg | Freq: Once | INTRAMUSCULAR | Status: AC
  Administered 2017-10-26: 30 mg via INTRAVENOUS

## 2017-10-26 MED ORDER — SODIUM CHLORIDE 0.9 % IV SOLN
500.0000 mL | Freq: Once | INTRAVENOUS | Status: AC
  Administered 2017-10-26: 500 mL via INTRAVENOUS

## 2017-10-26 NOTE — Progress Notes (Signed)
[facility] MD Progress Note  10/26/2017 4:07 PM Angela [name]  MRN:  [id number] Subjective: Follow-up patient with bipolar disorder.  Once again had ECT today.  Mood and behavior today have been very good.  Saw her this afternoon after ECT and although she has a little bit of memory impairment is not severe.  She understands her whole situation and remembers ECT and remembers me appropriately.  Does not have any new physical impairments or complaints.  Denies suicidal ideation.  Remains difficult to place. Principal Problem: Bipolar I disorder, most recent episode mixed, severe with psychotic features ([place]) Diagnosis:   Patient Active Problem List   Diagnosis Date Noted  . Bipolar I disorder, most recent episode mixed, severe with psychotic features ([place]) [F31.64] 09/18/2017  . Agitation [R45.1] 09/18/2017  . IBS (irritable bowel syndrome) [K58.9] 11/17/2016  . Bilateral carpal tunnel syndrome [G56.03] 11/17/2016  . Plantar fasciitis, bilateral [M72.2] 11/17/2016  . Chronic pain syndrome [G89.4] 11/17/2016  . Chronic hyponatremia [E87.1] 11/17/2016  . PTSD (post-traumatic stress disorder) [F43.10] 11/17/2016  . Hernia, hiatal [K44.9] 11/17/2016  . Vitamin D deficiency [E55.9] 11/17/2016  . Vitamin B12 deficiency [E53.8] 11/17/2016  . SIADH (syndrome of inappropriate ADH production) ([place]) [E22.2] 09/08/2016  . Cervical spondylosis with myelopathy [M47.12] 05/24/2016  . Post-concussion headache [G44.309] 05/24/2016  . MCI (mild cognitive impairment) with memory loss [G31.84] 03/30/2016  . Gait abnormality [R26.9] 03/30/2016  . Chronic bipolar affective disorder ([place]) [F31.9] 01/24/2016  . Anemia, iron deficiency [D50.9] 06/24/2015  . Endometriosis [N80.9] 06/24/2015  . Essential hypertension [I10] 06/24/2015  . History of postmenopausal bleeding [Z87.42] 05/22/2015  . History of TIA (transient ischemic attack) [Z86.73] 04/20/2015  . Hypothyroidism [E03.9] 04/20/2015  . GERD  (gastroesophageal reflux disease) [K21.9] 04/20/2015  . Cervical disc disorder with radiculopathy [M50.10] 03/25/2015  . Chronic neck pain [M54.2, G89.29] 03/25/2015  . Pelvic pain in female [R10.2] 10/30/2014  . History of skin cancer [Z85.828] 06/30/2014  . Chronic headaches [R51] 12/16/2013  . Neuropathy [G62.9] 12/16/2013   Total Time spent with patient: 30 minutes  Past Psychiatric History: Long-standing mental health problems with severe mood instability  Past Medical History:  Past Medical History:  Diagnosis Date  . Allergic rhinitis   . Anemia   . Blurred vision   . Depression   . Diverticulosis   . Endometriosis   . Falls   . GERD (gastroesophageal reflux disease)   . Hernia, hiatal   . Hypothyroidism   . IBS (irritable bowel syndrome)   . Malignant neoplasm of skin   . Migraine   . Neuropathy   . PTSD (post-traumatic stress disorder)   . Thyroid disease     Past Surgical History:  Procedure Laterality Date  . APPENDECTOMY    . CHOLECYSTECTOMY    . CHOLECYSTECTOMY, LAPAROSCOPIC    . ESOPHAGOGASTRODUODENOSCOPY (EGD) WITH PROPOFOL N/A 03/29/2015   Procedure: ESOPHAGOGASTRODUODENOSCOPY (EGD) WITH PROPOFOL;  Surgeon: Josefine Class, MD;  Location: [facility] ENDOSCOPY;  Service: Endoscopy;  Laterality: N/A;  . HERNIA REPAIR    . laproscopy    . TONSILLECTOMY    . uteral suspension     Family History:  Family History  Problem Relation Age of Onset  . Heart disease Father   . Cancer Mother        lung  . Urolithiasis Neg Hx   . Kidney disease Neg Hx   . Kidney cancer Neg Hx   . Prostate cancer Neg Hx    Family Psychiatric  History: See  previous note Social History:  Social History   Substance and Sexual Activity  Alcohol Use No     Social History   Substance and Sexual Activity  Drug Use No    Social History   Socioeconomic History  . Marital status: Single    Spouse name: Not on file  . Number of children: 0  . Years of education: 1.5 years  of college  . Highest education level: Not on file  Occupational History  . Occupation: Retired  Scientific laboratory technician  . Financial resource strain: Not on file  . Food insecurity:    Worry: Not on file    Inability: Not on file  . Transportation needs:    Medical: Not on file    Non-medical: Not on file  Tobacco Use  . Smoking status: Former Smoker    Last attempt to quit: 11/17/1976    Years since quitting: 40.9  . Smokeless tobacco: Never Used  Substance and Sexual Activity  . Alcohol use: No  . Drug use: No  . Sexual activity: Not Currently  Lifestyle  . Physical activity:    Days per week: Not on file    Minutes per session: Not on file  . Stress: Not on file  Relationships  . Social connections:    Talks on phone: Not on file    Gets together: Not on file    Attends religious service: Not on file    Active member of club or organization: Not on file    Attends meetings of clubs or organizations: Not on file    Relationship status: Not on file  Other Topics Concern  . Not on file  Social History Narrative   Lives at home alone.   Right-handed.   No daily caffeine use.   Additional Social History:    Pain Medications: See PTA Prescriptions: See PTA Over the Counter: See PTA History of alcohol / drug use?: No history of alcohol / drug abuse Longest period of sobriety (when/how long): Reports of none Negative Consequences of Use: (n/a) Withdrawal Symptoms: (n/a)                    Sleep: Fair  Appetite:  Fair  Current Medications: Current Facility-Administered Medications  Medication Dose Route Frequency Provider Last Rate Last Dose  . acetaminophen (TYLENOL) tablet 650 mg  650 mg Oral Q6H PRN Clapacs, Madie Reno, MD   650 mg at 10/26/17 1515  . alum & mag hydroxide-simeth (MAALOX/MYLANTA) 200-200-20 MG/5ML suspension 30 mL  30 mL Oral Q4H PRN Clapacs, John T, MD   30 mL at 10/08/17 1507  . amLODipine (NORVASC) tablet 5 mg  5 mg Oral BID Clapacs, Madie Reno, MD    5 mg at 10/26/17 0830  . calcium carbonate (TUMS - dosed in mg elemental calcium) chewable tablet 400 mg of elemental calcium  2 tablet Oral Q breakfast Pucilowska, Jolanta B, MD   400 mg of elemental calcium at 10/23/17 1309  . cholecalciferol (VITAMIN D) tablet 1,000 Units  1,000 Units Oral Daily Pucilowska, Jolanta B, MD   1,000 Units at 10/26/17 1502  . cycloSPORINE (RESTASIS) 0.05 % ophthalmic emulsion 1 drop  1 drop Both Eyes BID Clapacs, Madie Reno, MD   1 drop at 10/26/17 0833  . diclofenac sodium (VOLTAREN) 1 % transdermal gel 4 g  4 g Topical QID Pucilowska, Jolanta B, MD   4 g at 10/26/17 0834  . feeding supplement (BOOST / RESOURCE BREEZE) liquid 1  Container  1 Container Oral TID BM Clapacs, Madie Reno, MD   1 Container at 10/25/17 2059  . fentaNYL (SUBLIMAZE) injection 25 mcg  25 mcg Intravenous Q5 min PRN Alvin Critchley, MD      . ferrous sulfate tablet 325 mg  325 mg Oral Q breakfast Pucilowska, Jolanta B, MD   325 mg at 10/26/17 1502  . gabapentin (NEURONTIN) capsule 100 mg  100 mg Oral BID Clapacs, Madie Reno, MD   100 mg at 10/25/17 1838  . glycopyrrolate (ROBINUL) injection 0.1 mg  0.1 mg Intravenous Once Clapacs, John T, MD      . hydrocortisone cream 1 %   Topical BID Clapacs, John T, MD      . ipratropium (ATROVENT) 0.03 % nasal spray 2 spray  2 spray Each Nare TID He, Jun, MD   2 spray at 10/26/17 0834  . ketorolac (TORADOL) 30 MG/ML injection           . levalbuterol (XOPENEX) nebulizer solution 0.63 mg  0.63 mg Nebulization Q1400 Clapacs, Madie Reno, MD   0.63 mg at 10/24/17 1416  . levothyroxine (SYNTHROID, LEVOTHROID) tablet 100 mcg  100 mcg Oral QAC breakfast Pucilowska, Jolanta B, MD   100 mcg at 10/26/17 1506  . lidocaine (LIDODERM) 5 % 1 patch  1 patch Transdermal Q24H Pucilowska, Jolanta B, MD   1 patch at 10/24/17 1233  . linaclotide (LINZESS) capsule 290 mcg  290 mcg Oral QAC breakfast Pucilowska, Jolanta B, MD   290 mcg at 10/24/17 0629  . lip balm (BLISTEX) ointment   Topical PRN  Clapacs, John T, MD      . loperamide (IMODIUM) capsule 2 mg  2 mg Oral PRN Pucilowska, Jolanta B, MD   2 mg at 10/18/17 0002  . LORazepam (ATIVAN) tablet 0.5 mg  0.5 mg Oral Q6H PRN Clapacs, Madie Reno, MD   0.5 mg at 10/20/17 2134  . magnesium hydroxide (MILK OF MAGNESIA) suspension 30 mL  30 mL Oral Daily PRN Clapacs, Madie Reno, MD   30 mL at 10/05/17 1731  . magnesium oxide (MAG-OX) tablet 200 mg  200 mg Oral BID Pucilowska, Jolanta B, MD   200 mg at 10/25/17 1837  . midazolam (VERSED) injection 2 mg  2 mg Intravenous Once Clapacs, John T, MD      . multivitamin with minerals tablet 1 tablet  1 tablet Oral Daily Pucilowska, Jolanta B, MD   1 tablet at 10/26/17 1502  . omega-3 acid ethyl esters (LOVAZA) capsule 1 g  1 g Oral BID Pucilowska, Jolanta B, MD   1 g at 10/25/17 1837  . ondansetron (ZOFRAN) injection 4 mg  4 mg Intravenous Once Clapacs, John T, MD      . ondansetron [facility]) injection 4 mg  4 mg Intravenous Once PRN Gunnar Fusi, MD      . ondansetron [facility]) injection 4 mg  4 mg Intravenous Once PRN Gunnar Bulla, MD      . ondansetron [facility]) injection 4 mg  4 mg Intravenous Once PRN Alvin Critchley, MD      . ondansetron [facility]) injection 4 mg  4 mg Intravenous Once Clapacs, John T, MD      . ondansetron [facility]) injection 4 mg  4 mg Intravenous Once PRN Alvin Critchley, MD      . ondansetron (ZOFRAN-ODT) disintegrating tablet 4 mg  4 mg Oral Q8H PRN Pucilowska, Jolanta B, MD   4 mg at 10/08/17 1807  . PARoxetine (PAXIL-CR)  24 hr tablet 25 mg  25 mg Oral Daily Clapacs, Madie Reno, MD   25 mg at 10/26/17 1507  . polyethylene glycol (MIRALAX / GLYCOLAX) packet 17 g  17 g Oral Daily Pucilowska, Jolanta B, MD   17 g at 10/26/17 1502  . polyvinyl alcohol (LIQUIFILM TEARS) 1.4 % ophthalmic solution 1 drop  1 drop Both Eyes PRN Ramond Dial, MD   1 drop at 10/25/17 1840  . prazosin (MINIPRESS) capsule 2 mg  2 mg Oral QHS Clapacs, Madie Reno, MD   2 mg at 10/24/17 2215  . promethazine  (PHENERGAN) tablet 12.5 mg  12.5 mg Oral Q6H PRN Clapacs, John T, MD   12.5 mg at 10/24/17 1234  . pyridOXINE (VITAMIN B-6) tablet 100 mg  100 mg Oral BID Clapacs, Madie Reno, MD   100 mg at 10/25/17 1837  . tiZANidine (ZANAFLEX) tablet 2 mg  2 mg Oral Q8H PRN Pucilowska, Jolanta B, MD   2 mg at 10/21/17 1600  . traZODone (DESYREL) tablet 100 mg  100 mg Oral QHS Clapacs, Madie Reno, MD   100 mg at 10/25/17 2102  . triamcinolone cream (KENALOG) 0.1 %   Topical TID PRN Ramond Dial, MD   1 application at 09/60/45 2106  . vitamin C (ASCORBIC ACID) tablet 1,000 mg  1,000 mg Oral Daily Pucilowska, Jolanta B, MD   1,000 mg at 10/26/17 1507    Lab Results:  Results for orders placed or performed during the hospital encounter of 09/19/17 (from the past 48 hour(s))  Glucose, capillary     Status: None   Collection Time: 10/26/17  6:17 AM  Result Value Ref Range   Glucose-Capillary 79 70 - 99 mg/dL    Blood Alcohol level:  Lab Results  Component Value Date   ETH <5 09/29/2016   ETH <5 40/98/1191    Metabolic Disorder Labs: Lab Results  Component Value Date   HGBA1C 5.7 (H) 09/20/2017   MPG 116.89 09/20/2017   No results found for: PROLACTIN Lab Results  Component Value Date   CHOL 173 09/20/2017   TRIG 193 (H) 09/20/2017   HDL 50 09/20/2017   CHOLHDL 3.5 09/20/2017   VLDL 39 09/20/2017   LDLCALC 84 09/20/2017   LDLCALC 87 09/04/2017    Physical Findings: AIMS: Facial and Oral Movements Muscles of Facial Expression: None, normal Lips and Perioral Area: None, normal Jaw: None, normal Tongue: None, normal,Extremity Movements Upper (arms, wrists, hands, fingers): None, normal Lower (legs, knees, ankles, toes): None, normal, Trunk Movements Neck, shoulders, hips: None, normal, Overall Severity Severity of abnormal movements (highest score from questions above): None, normal Incapacitation due to abnormal movements: None, normal Patient's awareness of abnormal movements (rate only  patient's report): No Awareness, Dental Status Current problems with teeth and/or dentures?: No Does patient usually wear dentures?: No  CIWA:    COWS:     Musculoskeletal: Strength & Muscle Tone: within normal limits Gait & Station: normal Patient leans: N/A  Psychiatric Specialty Exam: Physical Exam  Nursing note and vitals reviewed. Constitutional: She appears well-developed and well-nourished.  HENT:  Head: Normocephalic and atraumatic.  Eyes: Pupils are equal, round, and reactive to light. Conjunctivae are normal.  Neck: Normal range of motion.  Cardiovascular: Regular rhythm and normal heart sounds.  Respiratory: Effort normal. No respiratory distress.  GI: Soft.  Musculoskeletal: Normal range of motion.  Neurological: She is alert.  Skin: Skin is warm and dry.  Psychiatric: She has a normal mood and affect.  Her speech is normal and behavior is normal. Judgment and thought content normal. Her affect is not blunt. She exhibits abnormal recent memory.    Review of Systems  Constitutional: Negative.   HENT: Negative.   Eyes: Negative.   Respiratory: Negative.   Cardiovascular: Negative.   Gastrointestinal: Negative.   Musculoskeletal: Negative.   Skin: Negative.   Neurological: Negative.   Psychiatric/Behavioral: Negative.     Blood pressure (!) 154/77, pulse 65, temperature 98.1 F (36.7 C), temperature source Oral, resp. rate (!) 24, height 5\' 3"  (1.6 m), weight 53.1 kg, SpO2 100 %.Body mass index is 20.73 kg/m.  General Appearance: Casual  Eye Contact:  Good  Speech:  Clear and Coherent  Volume:  Normal  Mood:  Euthymic  Affect:  Constricted  Thought Process:  Goal Directed  Orientation:  Full (Time, Place, and Person)  Thought Content:  Logical  Suicidal Thoughts:  No  Homicidal Thoughts:  No  Memory:  Immediate;   Fair Recent;   Poor Remote;   Fair  Judgement:  Fair  Insight:  Fair  Psychomotor Activity:  Decreased  Concentration:  Concentration:  Fair  Recall:  [name]:  Fair  Language:  Fair  Akathisia:  No  Handed:  Right  AIMS (if indicated):     Assets:  Desire for Improvement  ADL's:  Impaired  Cognition:  WNL  Sleep:  Number of Hours: 5.15     Treatment Plan Summary: Plan Patient very much needs to be discharged from the hospital but has been very oppositional to realistic attempts.  Social work is doing her aerobic work and trying to get the patient pointed in the right direction.  I have encouraged patient to really consider letting us place her in a facility this time as we do not have a lot of other options.  There is no need for any change to medication today.  I am on the fence about whether we will continue ECT next week but probably we will go ahead and do it Monday since I think that she is having minimal side effects as documented by the Mini-Mental status exam and seems to be continuing to show improved affect and behavior  Alethia Berthold, MD 10/26/2017, 4:07 PM

## 2017-10-26 NOTE — Progress Notes (Signed)
OT Cancellation Note  Patient Details Name: Angela [name] MRN: [id number] DOB: 07/04/1950   Cancelled Treatment:    Reason Eval/Treat Not Completed: Other (comment) Pt had ECT treatment today and was sound asleep.  NSG indicated she did not eat lunch after ECT either due to being fatigued which is not like her.  Will attempt OT session again next week.    Chrys Racer, OTR/L ascom [phone] 10/26/17, 3:17 PM

## 2017-10-26 NOTE — Progress Notes (Signed)
Recreation Therapy Notes  Date: 10/26/2017  Time: 9:30 am   Location: Craft room   Behavioral response: N/A   Intervention Topic: Happiness  Discussion/Intervention: Patient did not attend group.   Clinical Observations/Feedback:  Patient did not attend group.   Milos Milligan LRT/CTRS        Nam Vossler 10/26/2017 1:19 PM

## 2017-10-26 NOTE — Progress Notes (Signed)
D: Pt denies SI/HI/AVH, verbally is able to contract for safety. Pt. Behavior this evening is appropriate. Pt. Interactions with staff and peers pleasant and cooperative. Pt. Much less intrusive this evening. Pt. Behavior is much less demanding. Pt. Reports pleasant mood. Pt. Affect brighter. Pt. Continues to present very somatic.   A: Q x 15 minute observation checks were completed for safety. Patient was provided with education.  Patient was given/offered medications per orders. Patient  was encourage to attend groups, participate in unit activities and continue with plan of care. Pt. Chart and plans of care reviewed. Pt. Given support and encouragement.   R: Patient is complaint with medication and unit procedures. Pt. Is complaint with taking medications at prescribed timings. Pt. This evening does not get agitated about her medication regimen this evening. Pt. Blood sugars to be monitored per pre-ect protocol. Pt. NPO at midnight maintained. Pt. Given extensive falls risk education. Pt. Continues to ambulate steadily, but occasionally insists on using walker. Pt. Chronic pain being managed with provider orders. Pt. Blood pressure medication held for safety due to low heart heart.               Precautionary checks every 15 minutes for safety maintained, room free of safety hazards, patient sustains no injury or falls during this shift. Will endorse care to next shift.

## 2017-10-26 NOTE — Anesthesia Post-op Follow-up Note (Signed)
Anesthesia QCDR form completed.        

## 2017-10-26 NOTE — Anesthesia Procedure Notes (Signed)
Performed by: Laine Fonner, CRNA Pre-anesthesia Checklist: Patient identified, Patient being monitored, Timeout performed, Emergency Drugs available and Suction available Patient Re-evaluated:Patient Re-evaluated prior to induction Oxygen Delivery Method: Circle system utilized Preoxygenation: Pre-oxygenation with 100% oxygen Ventilation: Mask ventilation without difficulty Airway Equipment and Method: Bite block Dental Injury: Teeth and Oropharynx as per pre-operative assessment        

## 2017-10-26 NOTE — Procedures (Signed)
ECT SERVICES Physician's Interval Evaluation & Treatment Note  Patient Identification: Angela [name] MRN:  [id number] Date of Evaluation:  10/26/2017 TX #: [id number]  MADRS:   MMSE: 30  P.E. Findings:  No change to physical exam  Psychiatric Interval Note:  Mood stabilized  Subjective:  Patient is a 67 y.o. female seen for evaluation for Electroconvulsive Therapy. No specific new complaint  Treatment Summary:   [x]   Right Unilateral             []  Bilateral   % Energy : 0.3 ms 100%   Impedance: 2460 ohms  Seizure Energy Index: 6849 V squared  Postictal Suppression Index: 87%  Seizure Concordance Index: 93%  Medications  Pre Shock: Toradol 30 mg ketamine 50 mg succinylcholine 80 mg  Post Shock:    Seizure Duration: 42 seconds by EMG 45 seconds by EEG   Comments: Follow-up Monday given how well she is tolerating this  Lungs:  [x]   Clear to auscultation               []  Other:   Heart:    [x]   Regular rhythm             []  irregular rhythm    [x]   Previous H&P reviewed, patient examined and there are NO CHANGES                 []   Previous H&P reviewed, patient examined and there are changes noted.   Alethia Berthold, MD 10/11/201910:48 AM

## 2017-10-26 NOTE — [facility] Group Notes (Signed)
  [facility] LCSW Group Therapy Note  Date/Time: 10/26/17, 1300  Type of Therapy/Topic:  Group Therapy:  Emotion Regulation  Participation Level:  Did Not Attend   Mood:  Description of Group:    The purpose of this group is to assist patients in learning to regulate negative emotions and experience positive emotions. Patients will be guided to discuss ways in which they have been vulnerable to their negative emotions. These vulnerabilities will be juxtaposed with experiences of positive emotions or situations, and patients challenged to use positive emotions to combat negative ones. Special emphasis will be placed on coping with negative emotions in conflict situations, and patients will process healthy conflict resolution skills.  Therapeutic Goals: 1. Patient will identify two positive emotions or experiences to reflect on in order to balance out negative emotions:  2. Patient will label two or more emotions that they find the most difficult to experience:  3. Patient will be able to demonstrate positive conflict resolution skills through discussion or role plays:   Summary of Patient Progress:       Therapeutic Modalities:   Cognitive Behavioral Therapy Feelings Identification Dialectical Behavioral Therapy  Greg Tieisha Darden, LCSW 

## 2017-10-26 NOTE — Anesthesia Postprocedure Evaluation (Signed)
Anesthesia Post Note  Patient: Angela [name]  Procedure(s) Performed: ECT TX  Patient location during evaluation: PACU Anesthesia Type: General Level of consciousness: awake and alert Pain management: pain level controlled Vital Signs Assessment: post-procedure vital signs reviewed and stable Respiratory status: spontaneous breathing, nonlabored ventilation and respiratory function stable Cardiovascular status: blood pressure returned to baseline and stable Postop Assessment: no signs of nausea or vomiting Anesthetic complications: no     Last Vitals:  Vitals:   10/26/17 1048 10/26/17 1107  BP:  (!) 170/78  Pulse: 60   Resp: 15 14  Temp:    SpO2: 100%     Last Pain:  Vitals:   10/26/17 1046  TempSrc:   PainSc: Asleep                 Sadarius Norman

## 2017-10-26 NOTE — Anesthesia Preprocedure Evaluation (Signed)
Anesthesia Evaluation  Patient identified by MRN, date of birth, ID band Patient awake    Reviewed: Allergy & Precautions, NPO status , Patient's Chart, lab work & pertinent test results  History of Anesthesia Complications Negative for: history of anesthetic complications  Airway Mallampati: II  TM Distance: >3 FB Neck ROM: Full    Dental  (+) Poor Dentition   Pulmonary neg sleep apnea, neg COPD, former smoker,    breath sounds clear to auscultation- rhonchi (-) wheezing      Cardiovascular hypertension, (-) CAD, (-) Past MI, (-) Cardiac Stents and (-) CABG  Rhythm:Regular Rate:Normal - Systolic murmurs and - Diastolic murmurs    Neuro/Psych  Headaches, PSYCHIATRIC DISORDERS Anxiety Depression Bipolar Disorder    GI/Hepatic Neg liver ROS, hiatal hernia, GERD  ,  Endo/Other  neg diabetesHypothyroidism   Renal/GU negative Renal ROS     Musculoskeletal negative musculoskeletal ROS (+)   Abdominal (+) - obese,   Peds  Hematology  (+) anemia ,   Anesthesia Other Findings Past Medical History: No date: Allergic rhinitis No date: Anemia No date: Blurred vision No date: Depression No date: Diverticulosis No date: Endometriosis No date: Falls No date: GERD (gastroesophageal reflux disease) No date: Hernia, hiatal No date: Hypothyroidism No date: IBS (irritable bowel syndrome) No date: Malignant neoplasm of skin No date: Migraine No date: Neuropathy No date: PTSD (post-traumatic stress disorder) No date: Thyroid disease   Reproductive/Obstetrics                             Anesthesia Physical  Anesthesia Plan  ASA: II  Anesthesia Plan: General   Post-op Pain Management:    Induction: Intravenous  PONV Risk Score and Plan: 2 and Promethazine  Airway Management Planned: Mask  Additional Equipment:   Intra-op Plan:   Post-operative Plan:   Informed Consent: I have reviewed  the patients History and Physical, chart, labs and discussed the procedure including the risks, benefits and alternatives for the proposed anesthesia with the patient or authorized representative who has indicated his/her understanding and acceptance.   Dental advisory given  Plan Discussed with: CRNA and Anesthesiologist  Anesthesia Plan Comments:         Anesthesia Quick Evaluation

## 2017-10-26 NOTE — Plan of Care (Signed)
Pt. More redirectable this evening. Pt. Coping improved. Pt. Compliance with unit procedures and medications improved. Pt. Denies si/hi, verbally is able to contract for safety. Pt. Reports she can remain safe while on the unit.    Problem: Coping: Goal: Coping ability will improve Outcome: Progressing   Problem: Medication: Goal: Compliance with prescribed medication regimen will improve Outcome: Progressing   Problem: Self-Concept: Goal: Ability to disclose and discuss suicidal ideas will improve Outcome: Progressing   Problem: Safety: Goal: Ability to remain free from injury will improve Outcome: Progressing

## 2017-10-26 NOTE — H&P (Signed)
Angela [name] is an 67 y.o. female.   Chief Complaint: Patient with multiple minor somatic complaints chronically but no major mood problems currently HPI: History of severe long-standing mood instability  Past Medical History:  Diagnosis Date  . Allergic rhinitis   . Anemia   . Blurred vision   . Depression   . Diverticulosis   . Endometriosis   . Falls   . GERD (gastroesophageal reflux disease)   . Hernia, hiatal   . Hypothyroidism   . IBS (irritable bowel syndrome)   . Malignant neoplasm of skin   . Migraine   . Neuropathy   . PTSD (post-traumatic stress disorder)   . Thyroid disease     Past Surgical History:  Procedure Laterality Date  . APPENDECTOMY    . CHOLECYSTECTOMY    . CHOLECYSTECTOMY, LAPAROSCOPIC    . ESOPHAGOGASTRODUODENOSCOPY (EGD) WITH PROPOFOL N/A 03/29/2015   Procedure: ESOPHAGOGASTRODUODENOSCOPY (EGD) WITH PROPOFOL;  Surgeon: Josefine Class, MD;  Location: [facility] ENDOSCOPY;  Service: Endoscopy;  Laterality: N/A;  . HERNIA REPAIR    . laproscopy    . TONSILLECTOMY    . uteral suspension      Family History  Problem Relation Age of Onset  . Heart disease Father   . Cancer Mother        lung  . Urolithiasis Neg Hx   . Kidney disease Neg Hx   . Kidney cancer Neg Hx   . Prostate cancer Neg Hx    Social History:  reports that she quit smoking about 40 years ago. She has never used smokeless tobacco. She reports that she does not drink alcohol or use drugs.  Allergies:  Allergies  Allergen Reactions  . Bee Venom Hives  . Darvon [Propoxyphene] Anaphylaxis and Hives    Hives/throat swelling   . Dicyclomine Itching  . Other Hives    Spider bites cause hives  . Oxycodone-Acetaminophen Nausea And Vomiting and Other (See Comments)    hallucinations   . Peanuts [Peanut Oil] Anaphylaxis and Hives  . Penicillins Hives    Has patient had a PCN reaction causing immediate rash, facial/tongue/throat swelling, SOB or lightheadedness with  hypotension: Unknown Has patient had a PCN reaction causing severe rash involving mucus membranes or skin necrosis: Unknown Has patient had a PCN reaction that required hospitalization: Unknown Has patient had a PCN reaction occurring within the last 10 years: Unknown If all of the above answers are "NO", then may proceed with Cephalosporin use.  . Sulfa Antibiotics Anaphylaxis, Hives and Itching  . Sulfacetamide Sodium Anaphylaxis, Hives and Itching  . Ultram [Tramadol] Hives  . Amikacin Nausea And Vomiting  . Aspirin Hives  . Doxycycline Photosensitivity  . Haloperidol Nausea And Vomiting  . Hymenoptera Venom Preparations Hives    Reaction to spider bites  . Imipramine Hives    Reaction to Tofranil  . Keflex [Cephalexin] Hives  . Lactose Intolerance (Gi)   . Sulfur Hives  . Adhesive [Tape] Hives and Rash  . Albuterol Palpitations  . Hydroxyzine Anxiety and Itching  . Prednisone Nausea And Vomiting, Anxiety and Other (See Comments)    Crazy mood swings, strange thoughts (pt does tolerate cortisone injections)    Medications Prior to Admission  Medication Sig Dispense Refill  . amLODipine (NORVASC) 5 MG tablet Take 1 tablet (5 mg total) by mouth 2 (two) times daily. 180 tablet 1  . ANORO ELLIPTA 62.5-25 MCG/INH AEPB Inhale 1 puff into the lungs daily. 14 each 0  . calcium  carbonate (OS-CAL) 600 MG TABS tablet Take 1 tablet (600 mg total) by mouth 2 (two) times daily with a meal. (Patient taking differently: Take 600 mg 3 (three) times daily by mouth. ) 60 tablet 0  . cholecalciferol (VITAMIN D) 1000 units tablet Take 4,000 Units by mouth daily.    . citalopram (CELEXA) 20 MG tablet Take 20 mg by mouth daily.  0  . conjugated estrogens (PREMARIN) vaginal cream Place vaginally 3 (three) times a week. Use 1/2 gram 3-4 times weekly. 42.5 g 4  . cycloSPORINE (RESTASIS) 0.05 % ophthalmic emulsion Place 1 drop into both eyes 2 (two) times daily. 60 each 5  . diclofenac sodium (VOLTAREN) 1  % GEL Apply 2 g topically 4 (four) times daily.    . ferrous sulfate (FERROUSUL) 325 (65 FE) MG tablet Take 1 tablet (325 mg total) by mouth daily with breakfast. 90 tablet 1  . gabapentin (NEURONTIN) 100 MG capsule Take 1-2 capsules (100-200 mg total) by mouth 3 (three) times daily. (Patient taking differently: Take 100 mg by mouth 2 (two) times daily. ) 540 capsule 1  . guaiFENesin (MUCINEX) 600 MG 12 hr tablet Take 1 tablet (600 mg total) by mouth 2 (two) times daily as needed for cough. (Patient taking differently: Take 600 mg by mouth daily. ) 30 tablet 0  . hydrocortisone (PROCTOZONE-HC) 2.5 % rectal cream Place 1 application rectally 2 (two) times daily. 30 g 1  . hydrOXYzine (ATARAX/VISTARIL) 10 MG tablet Take 1 tablet by mouth 2 (two) times daily as needed for anxiety.     Marland Kitchen KRILL OIL OMEGA-3 PO Take 350 mg by mouth daily.    Marland Kitchen levalbuterol (XOPENEX) 1.25 MG/3ML nebulizer solution Inhale 3 mLs into the lungs daily.    Marland Kitchen levothyroxine (SYNTHROID, LEVOTHROID) 100 MCG tablet Take 1 tablet (100 mcg total) by mouth daily before breakfast. 90 tablet 1  . linaclotide (LINZESS) 290 MCG CAPS capsule Take 1 capsule (290 mcg total) by mouth daily before breakfast. 90 capsule 1  . magnesium oxide (MAG-OX) 400 MG tablet Take 400 mg by mouth daily with breakfast.    . montelukast (SINGULAIR) 10 MG tablet Take 1 tablet by mouth at bedtime.  11  . omeprazole (PRILOSEC) 20 MG capsule Take 1 capsule by mouth daily.    Vladimir Faster Glycol-Propyl Glycol (SYSTANE OP) Place 1 drop into both eyes daily as needed (dry eyes).    . polyethylene glycol powder (GLYCOLAX/MIRALAX) powder Take 17-34 g by mouth daily. 1700 g 5  . Probiotic Product (PROBIOTIC PO) Take 1 tablet by mouth daily.    . promethazine (PHENERGAN) 25 MG tablet Take 0.5 tablets (12.5 mg total) by mouth every 6 (six) hours as needed for nausea or vomiting. 30 tablet 0  . Pyridoxine HCl (VITAMIN B-6) 500 MG tablet Take 1 tablet by mouth 2 (two) times  daily.    . ranitidine (ZANTAC) 150 MG tablet Take 150 mg by mouth daily before supper.    Marland Kitchen tiZANidine (ZANAFLEX) 2 MG tablet Take 0.5-1 tablets (1-2 mg total) by mouth 2 (two) times daily as needed for muscle spasms. 180 tablet 1  . tolterodine (DETROL LA) 2 MG 24 hr capsule Take 1 capsule (2 mg total) daily by mouth. 30 capsule 11  . traZODone (DESYREL) 50 MG tablet Take 50 mg by mouth at bedtime.      Results for orders placed or performed during the hospital encounter of 09/19/17 (from the past 48 hour(s))  Glucose, capillary  Status: None   Collection Time: 10/26/17  6:17 AM  Result Value Ref Range   Glucose-Capillary 79 70 - 99 mg/dL   No results found.  Review of Systems  Constitutional: Negative.   HENT: Negative.   Eyes: Negative.   Respiratory: Negative.   Cardiovascular: Negative.   Gastrointestinal: Negative.   Musculoskeletal: Negative.   Skin: Negative.   Neurological: Negative.   Psychiatric/Behavioral: Negative.     Blood pressure 113/66, pulse 68, temperature 98.1 F (36.7 C), temperature source Oral, resp. rate 16, height 5\' 3"  (1.6 m), weight 53.1 kg, SpO2 98 %. Physical Exam  Nursing note and vitals reviewed. Constitutional: She appears well-developed and well-nourished.  HENT:  Head: Normocephalic and atraumatic.  Eyes: Pupils are equal, round, and reactive to light. Conjunctivae are normal.  Neck: Normal range of motion.  Cardiovascular: Normal heart sounds.  Respiratory: Effort normal.  GI: Soft.  Musculoskeletal: Normal range of motion.  Neurological: She is alert.  Skin: Skin is warm and dry.  Psychiatric: Her speech is normal and behavior is normal. Judgment and thought content normal. Her mood appears anxious. Her affect is labile. Cognition and memory are normal.     Assessment/Plan Patient continues to show stable improvement with ECT and I think that given the severity of her chronic lability it is worth it to continue treatment because  it is continuing to keep her stable enough that she can focus on discharge planning.  Alethia Berthold, MD 10/26/2017, 10:29 AM

## 2017-10-26 NOTE — Plan of Care (Signed)
Patient is pleasant and cooperative today.No irritable behaviors noted.Tolerated ECT well.Denies SI,HI and AVH.Appropriate with staff & peers.Appetite and energy level good.Compliant with medications.Support and encouragement given.

## 2017-10-26 NOTE — Transfer of Care (Signed)
Immediate Anesthesia Transfer of Care Note  Patient: Angela [name]  Procedure(s) Performed: ECT TX  Patient Location: PACU  Anesthesia Type:General  Level of Consciousness: drowsy  Airway & Oxygen Therapy: Patient Spontanous Breathing and Patient connected to face mask oxygen  Post-op Assessment: Report given to RN and Post -op Vital signs reviewed and stable  Post vital signs: Reviewed and stable  Last Vitals:  Vitals Value Taken Time  BP 159/79 10/26/2017 10:46 AM  Temp    Pulse 65 10/26/2017 10:48 AM  Resp 17 10/26/2017 10:48 AM  SpO2 100 % 10/26/2017 10:48 AM  Vitals shown include unvalidated device data.  Last Pain:  Vitals:   10/26/17 0952  TempSrc:   PainSc: 0-No pain      Patients Stated Pain Goal: 4 (17/20/91 0681)  Complications: No apparent anesthesia complications

## 2017-10-27 MED ORDER — IPRATROPIUM BROMIDE 0.06 % NA SOLN
2.0000 | Freq: Once | NASAL | Status: AC
  Administered 2017-10-27: 2 via NASAL
  Filled 2017-10-27: qty 15

## 2017-10-27 MED ORDER — LORAZEPAM 1 MG PO TABS
1.0000 mg | ORAL_TABLET | Freq: Once | ORAL | Status: AC
  Administered 2017-10-27: 1 mg via ORAL
  Filled 2017-10-27: qty 1

## 2017-10-27 NOTE — Progress Notes (Signed)
D: Patient stated slept good last night and up to 12 noon today .Stated appetite is good and energy level  Is normal. Stated concentration fair. Continue  With ECT therapy Denies suicidal  homicidal ideations  .  No auditory hallucinations  No pain concerns . Appropriate ADL'S. Interacting with peers and staff.The ability to make decisions is ongoing  patient continue to need  direction with this goal. Working on coping  skills . Social Workers assisting with  placement  needs  with resources available.Compliant  with medication received . Denies suicidal  ideations . Voice of no safety concerns . Able to verbalize positive feeling of self .    A: Encourage patient participation with unit programming . Instruction  Given on  Medication , verbalize understanding. R: Voice no other concerns. Staff continue to monitor

## 2017-10-27 NOTE — Progress Notes (Signed)
Patient ID: Angela [name], female   DOB: 06/16/1950, 67 y.o.   MRN: [id number]  Ms. Schechter woke up in the middle of the night complaining of running nose and DEMANDED that her nurse calls the psychiatrist on call to treat it.   Her VS are stable, there is no SOB or any other symptoms.  I reviewed her chart. The patient claims allergy to Albuterol that gives her palpitation. Pseudoephedrine (Sudafed) and oxymetazoline (Afrin) show cross allergy with Albuterol.  PLAN: There is no indication for antibiotic, that the patient desires. She is already on Atrovent for rhinorrhea.We will give an extra dose of Atrovent. I will not order Sudafed or Afrin. I requested that I will not be called again about her running nose tonight. I will give 1 mg of Ativan prn insomnia, one time dose.

## 2017-10-27 NOTE — Plan of Care (Signed)
The ability to make decisions is ongoing  patient continue to need  direction with this goal. Working on coping  skills . Social Workers assisting with  placement  needs  with resources available.Compliant  with medication received . Denies suicidal  ideations . Voice of no safety concerns . Able to verbalize positive feeling of self .   Problem: Education: Goal: Ability to make informed decisions regarding treatment will improve Outcome: Progressing   Problem: Coping: Goal: Coping ability will improve Outcome: Progressing   Problem: Health Behavior/Discharge Planning: Goal: Identification of resources available to assist in meeting health care needs will improve Outcome: Progressing   Problem: Medication: Goal: Compliance with prescribed medication regimen will improve Outcome: Progressing   Problem: Self-Concept: Goal: Ability to disclose and discuss suicidal ideas will improve Outcome: Progressing Goal: Will verbalize positive feelings about self Outcome: Progressing   Problem: Safety: Goal: Ability to remain free from injury will improve Outcome: Progressing   Problem: Spiritual Needs Goal: Ability to function at adequate level Outcome: Progressing

## 2017-10-27 NOTE — Plan of Care (Signed)
  Problem: Self-Concept: Goal: Ability to disclose and discuss suicidal ideas will improve Outcome: Progressing  Patient denies suicidal ideation.  

## 2017-10-27 NOTE — Progress Notes (Signed)
[facility] MD Progress Note  10/27/2017 1:05 PM Angela [name]  MRN:  [id number]     Subjective: The patient was up in the middle the night complaining of rhinorrhea.  She was given Ativan 1 mg p.o. nightly and an extra dose of Atrovent.  The patient slept through breakfast this morning but did get up for lunch.  No significant changes in terms of her mood.  She was fairly calm with this Probation officer but per nursing has been labile and demanding.  She denies any current active or passive suicidal thoughts.  No auditory or visual hallucinations.  She is opposed to having a diagnosis of bipolar disorder and only wants a diagnosis of borderline personality disorder.  The patient does not believe that she has bipolar.  Vital signs have been stable.  Although nursing reported 1.5 hours of sleep, the patient did sleep through the morning.  Appetite is fair.  She does need redirection.  She has attended some groups but does not feel like they are helpful.  Principal Problem: Bipolar I disorder, most recent episode mixed, severe with psychotic features ([place]) Diagnosis:   Patient Active Problem List   Diagnosis Date Noted  . Bipolar I disorder, most recent episode mixed, severe with psychotic features ([place]) [F31.64] 09/18/2017  . Agitation [R45.1] 09/18/2017  . IBS (irritable bowel syndrome) [K58.9] 11/17/2016  . Bilateral carpal tunnel syndrome [G56.03] 11/17/2016  . Plantar fasciitis, bilateral [M72.2] 11/17/2016  . Chronic pain syndrome [G89.4] 11/17/2016  . Chronic hyponatremia [E87.1] 11/17/2016  . PTSD (post-traumatic stress disorder) [F43.10] 11/17/2016  . Hernia, hiatal [K44.9] 11/17/2016  . Vitamin D deficiency [E55.9] 11/17/2016  . Vitamin B12 deficiency [E53.8] 11/17/2016  . SIADH (syndrome of inappropriate ADH production) ([place]) [E22.2] 09/08/2016  . Cervical spondylosis with myelopathy [M47.12] 05/24/2016  . Post-concussion headache [G44.309] 05/24/2016  . MCI (mild cognitive impairment) with  memory loss [G31.84] 03/30/2016  . Gait abnormality [R26.9] 03/30/2016  . Chronic bipolar affective disorder ([place]) [F31.9] 01/24/2016  . Anemia, iron deficiency [D50.9] 06/24/2015  . Endometriosis [N80.9] 06/24/2015  . Essential hypertension [I10] 06/24/2015  . History of postmenopausal bleeding [Z87.42] 05/22/2015  . History of TIA (transient ischemic attack) [Z86.73] 04/20/2015  . Hypothyroidism [E03.9] 04/20/2015  . GERD (gastroesophageal reflux disease) [K21.9] 04/20/2015  . Cervical disc disorder with radiculopathy [M50.10] 03/25/2015  . Chronic neck pain [M54.2, G89.29] 03/25/2015  . Pelvic pain in female [R10.2] 10/30/2014  . History of skin cancer [Z85.828] 06/30/2014  . Chronic headaches [R51] 12/16/2013  . Neuropathy [G62.9] 12/16/2013   Total Time spent with patient: 30 minutes  Past Psychiatric History: Long-standing mental health problems with severe mood instability  Past Medical History:  Past Medical History:  Diagnosis Date  . Allergic rhinitis   . Anemia   . Blurred vision   . Depression   . Diverticulosis   . Endometriosis   . Falls   . GERD (gastroesophageal reflux disease)   . Hernia, hiatal   . Hypothyroidism   . IBS (irritable bowel syndrome)   . Malignant neoplasm of skin   . Migraine   . Neuropathy   . PTSD (post-traumatic stress disorder)   . Thyroid disease     Past Surgical History:  Procedure Laterality Date  . APPENDECTOMY    . CHOLECYSTECTOMY    . CHOLECYSTECTOMY, LAPAROSCOPIC    . ESOPHAGOGASTRODUODENOSCOPY (EGD) WITH PROPOFOL N/A 03/29/2015   Procedure: ESOPHAGOGASTRODUODENOSCOPY (EGD) WITH PROPOFOL;  Surgeon: Josefine Class, MD;  Location[facility] ENDOSCOPY;  Service: Endoscopy;  Laterality: N/A;  . HERNIA REPAIR    . laproscopy    . TONSILLECTOMY    . uteral suspension     Family History:  Family History  Problem Relation Age of Onset  . Heart disease Father   . Cancer Mother        lung  . Urolithiasis Neg Hx   . Kidney  disease Neg Hx   . Kidney cancer Neg Hx   . Prostate cancer Neg Hx     Social History:  Social History   Substance and Sexual Activity  Alcohol Use No     Social History   Substance and Sexual Activity  Drug Use No    Social History   Socioeconomic History  . Marital status: Single    Spouse name: Not on file  . Number of children: 0  . Years of education: 1.5 years of college  . Highest education level: Not on file  Occupational History  . Occupation: Retired  Scientific laboratory technician  . Financial resource strain: Not on file  . Food insecurity:    Worry: Not on file    Inability: Not on file  . Transportation needs:    Medical: Not on file    Non-medical: Not on file  Tobacco Use  . Smoking status: Former Smoker    Last attempt to quit: 11/17/1976    Years since quitting: 40.9  . Smokeless tobacco: Never Used  Substance and Sexual Activity  . Alcohol use: No  . Drug use: No  . Sexual activity: Not Currently  Lifestyle  . Physical activity:    Days per week: Not on file    Minutes per session: Not on file  . Stress: Not on file  Relationships  . Social connections:    Talks on phone: Not on file    Gets together: Not on file    Attends religious service: Not on file    Active member of club or organization: Not on file    Attends meetings of clubs or organizations: Not on file    Relationship status: Not on file  Other Topics Concern  . Not on file  Social History Narrative   Lives at home alone.   Right-handed.   No daily caffeine use.   Additional Social History:    Pain Medications: See PTA Prescriptions: See PTA Over the Counter: See PTA History of alcohol / drug use?: No history of alcohol / drug abuse Longest period of sobriety (when/how long): Reports of none Negative Consequences of Use: (n/a) Withdrawal Symptoms: (n/a)                    Sleep: Fair  Appetite:  Fair  Current Medications: Current Facility-Administered Medications   Medication Dose Route Frequency Provider Last Rate Last Dose  . acetaminophen (TYLENOL) tablet 650 mg  650 mg Oral Q6H PRN Clapacs, Madie Reno, MD   650 mg at 10/26/17 1515  . alum & mag hydroxide-simeth (MAALOX/MYLANTA) 200-200-20 MG/5ML suspension 30 mL  30 mL Oral Q4H PRN Clapacs, John T, MD   30 mL at 10/08/17 1507  . amLODipine (NORVASC) tablet 5 mg  5 mg Oral BID Clapacs, Madie Reno, MD   5 mg at 10/27/17 1229  . calcium carbonate (TUMS - dosed in mg elemental calcium) chewable tablet 400 mg of elemental calcium  2 tablet Oral Q breakfast Pucilowska, Jolanta B, MD   400 mg of elemental calcium at 10/23/17 1309  . cholecalciferol (VITAMIN D)  tablet 1,000 Units  1,000 Units Oral Daily Pucilowska, Jolanta B, MD   1,000 Units at 10/27/17 1220  . cycloSPORINE (RESTASIS) 0.05 % ophthalmic emulsion 1 drop  1 drop Both Eyes BID Clapacs, Madie Reno, MD   1 drop at 10/27/17 1232  . diclofenac sodium (VOLTAREN) 1 % transdermal gel 4 g  4 g Topical QID Pucilowska, Jolanta B, MD   4 g at 10/27/17 1233  . feeding supplement (BOOST / RESOURCE BREEZE) liquid 1 Container  1 Container Oral TID BM Clapacs, Madie Reno, MD   Stopped at 10/27/17 1010  . fentaNYL (SUBLIMAZE) injection 25 mcg  25 mcg Intravenous Q5 min PRN Alvin Critchley, MD      . ferrous sulfate tablet 325 mg  325 mg Oral Q breakfast Pucilowska, Jolanta B, MD   325 mg at 10/27/17 1235  . gabapentin (NEURONTIN) capsule 100 mg  100 mg Oral BID Clapacs, Madie Reno, MD   100 mg at 10/27/17 1234  . glycopyrrolate (ROBINUL) injection 0.1 mg  0.1 mg Intravenous Once Clapacs, John T, MD      . hydrocortisone cream 1 %   Topical BID Clapacs, John T, MD      . ipratropium (ATROVENT) 0.03 % nasal spray 2 spray  2 spray Each Nare TID He, Jun, MD   2 spray at 10/27/17 1233  . levalbuterol (XOPENEX) nebulizer solution 0.63 mg  0.63 mg Nebulization Q1400 Clapacs, Madie Reno, MD   0.63 mg at 10/24/17 1416  . levothyroxine (SYNTHROID, LEVOTHROID) tablet 100 mcg  100 mcg Oral QAC breakfast  Pucilowska, Jolanta B, MD   100 mcg at 10/27/17 1230  . lidocaine (LIDODERM) 5 % 1 patch  1 patch Transdermal Q24H Pucilowska, Jolanta B, MD   1 patch at 10/26/17 1800  . linaclotide (LINZESS) capsule 290 mcg  290 mcg Oral QAC breakfast Pucilowska, Jolanta B, MD   290 mcg at 10/27/17 1241  . lip balm (BLISTEX) ointment   Topical PRN Clapacs, John T, MD      . loperamide (IMODIUM) capsule 2 mg  2 mg Oral PRN Pucilowska, Jolanta B, MD   2 mg at 10/18/17 0002  . LORazepam (ATIVAN) tablet 0.5 mg  0.5 mg Oral Q6H PRN Clapacs, Madie Reno, MD   0.5 mg at 10/20/17 2134  . magnesium hydroxide (MILK OF MAGNESIA) suspension 30 mL  30 mL Oral Daily PRN Clapacs, Madie Reno, MD   30 mL at 10/05/17 1731  . magnesium oxide (MAG-OX) tablet 200 mg  200 mg Oral BID Pucilowska, Jolanta B, MD   200 mg at 10/27/17 1242  . midazolam (VERSED) injection 2 mg  2 mg Intravenous Once Clapacs, John T, MD      . multivitamin with minerals tablet 1 tablet  1 tablet Oral Daily Pucilowska, Jolanta B, MD   1 tablet at 10/27/17 1236  . omega-3 acid ethyl esters (LOVAZA) capsule 1 g  1 g Oral BID Pucilowska, Jolanta B, MD   1 g at 10/27/17 1237  . ondansetron (ZOFRAN) injection 4 mg  4 mg Intravenous Once Clapacs, John T, MD      . ondansetron [facility]) injection 4 mg  4 mg Intravenous Once PRN Gunnar Fusi, MD      . ondansetron [facility]) injection 4 mg  4 mg Intravenous Once PRN Gunnar Bulla, MD      . ondansetron [facility]) injection 4 mg  4 mg Intravenous Once PRN Alvin Critchley, MD      . ondansetron (  ZOFRAN) injection 4 mg  4 mg Intravenous Once Clapacs, John T, MD      . ondansetron [facility]) injection 4 mg  4 mg Intravenous Once PRN Alvin Critchley, MD      . ondansetron (ZOFRAN-ODT) disintegrating tablet 4 mg  4 mg Oral Q8H PRN Pucilowska, Jolanta B, MD   4 mg at 10/08/17 1807  . PARoxetine (PAXIL-CR) 24 hr tablet 25 mg  25 mg Oral Daily Clapacs, Madie Reno, MD   25 mg at 10/27/17 1238  . polyethylene glycol (MIRALAX / GLYCOLAX) packet  17 g  17 g Oral Daily Pucilowska, Jolanta B, MD   17 g at 10/26/17 1502  . polyvinyl alcohol (LIQUIFILM TEARS) 1.4 % ophthalmic solution 1 drop  1 drop Both Eyes PRN Ramond Dial, MD   1 drop at 10/25/17 1840  . prazosin (MINIPRESS) capsule 2 mg  2 mg Oral QHS Clapacs, Madie Reno, MD   2 mg at 10/27/17 0014  . promethazine (PHENERGAN) tablet 12.5 mg  12.5 mg Oral Q6H PRN Clapacs, John T, MD   12.5 mg at 10/24/17 1234  . pyridOXINE (VITAMIN B-6) tablet 100 mg  100 mg Oral BID Clapacs, Madie Reno, MD   100 mg at 10/27/17 1239  . tiZANidine (ZANAFLEX) tablet 2 mg  2 mg Oral Q8H PRN Pucilowska, Jolanta B, MD   2 mg at 10/21/17 1600  . traZODone (DESYREL) tablet 100 mg  100 mg Oral QHS Clapacs, Madie Reno, MD   100 mg at 10/27/17 0114  . triamcinolone cream (KENALOG) 0.1 %   Topical TID PRN Ramond Dial, MD   1 application at 36/14/43 2106  . vitamin C (ASCORBIC ACID) tablet 1,000 mg  1,000 mg Oral Daily Pucilowska, Jolanta B, MD   1,000 mg at 10/27/17 1244    Lab Results:  Results for orders placed or performed during the hospital encounter of 09/19/17 (from the past 48 hour(s))  Glucose, capillary     Status: None   Collection Time: 10/26/17  6:17 AM  Result Value Ref Range   Glucose-Capillary 79 70 - 99 mg/dL    Blood Alcohol level:  Lab Results  Component Value Date   ETH <5 09/29/2016   ETH <5 15/40/0867    Metabolic Disorder Labs: Lab Results  Component Value Date   HGBA1C 5.7 (H) 09/20/2017   MPG 116.89 09/20/2017   No results found for: PROLACTIN Lab Results  Component Value Date   CHOL 173 09/20/2017   TRIG 193 (H) 09/20/2017   HDL 50 09/20/2017   CHOLHDL 3.5 09/20/2017   VLDL 39 09/20/2017   LDLCALC 84 09/20/2017   LDLCALC 87 09/04/2017    Physical Findings: AIMS: Facial and Oral Movements Muscles of Facial Expression: None, normal Lips and Perioral Area: None, normal Jaw: None, normal Tongue: None, normal,Extremity Movements Upper (arms, wrists, hands,  fingers): None, normal Lower (legs, knees, ankles, toes): None, normal, Trunk Movements Neck, shoulders, hips: None, normal, Overall Severity Severity of abnormal movements (highest score from questions above): None, normal Incapacitation due to abnormal movements: None, normal Patient's awareness of abnormal movements (rate only patient's report): No Awareness, Dental Status Current problems with teeth and/or dentures?: No Does patient usually wear dentures?: No      Musculoskeletal: Strength & Muscle Tone: within normal limits Gait & Station: normal Patient leans: N/A  Psychiatric Specialty Exam: Physical Exam  Nursing note and vitals reviewed. Constitutional: She appears well-developed and well-nourished.  HENT:  Head: Normocephalic and atraumatic.  Eyes: Pupils  are equal, round, and reactive to light. Conjunctivae are normal.  Neck: Normal range of motion.  Cardiovascular: Regular rhythm and normal heart sounds.  Respiratory: Effort normal. No respiratory distress.  GI: Soft.  Musculoskeletal: Normal range of motion.  Neurological: She is alert.  Skin: Skin is warm and dry.  Psychiatric: Her speech is normal and behavior is normal. Judgment and thought content normal. Her mood appears anxious. Her affect is not blunt. She exhibits abnormal recent memory.    Review of Systems  Constitutional: Negative.   HENT: Negative.   Eyes: Negative.   Respiratory: Negative.   Cardiovascular: Negative.   Gastrointestinal: Negative.   Musculoskeletal: Negative.   Skin: Negative.   Neurological: Negative.   Psychiatric/Behavioral: Negative.     Blood pressure 122/80, pulse 64, temperature 98.4 F (36.9 C), temperature source Oral, resp. rate 16, height 5\' 3"  (1.6 m), weight 53.1 kg, SpO2 97 %.Body mass index is 20.73 kg/m.  General Appearance: Disheveled  Eye Contact:  Good  Speech:  Clear and Coherent and Normal Rate  Volume:  Normal  Mood:  "I am OK"  Affect:  Restricted with  this writer but labile over past 24 hours  Thought Process:  Goal Directed  Orientation:  Full (Time, Place, and Person)  Thought Content:  logical and goal directed  Suicidal Thoughts:  No  Homicidal Thoughts:  No  Memory:  Immediate;   Fair Recent;   Poor Remote;   Fair  Judgement:  Fair  Insight:  Fair  Psychomotor Activity:  Decreased  Concentration:  Concentration: Fair  Recall:  [name]:  Fair  Language:  Fair  Akathisia:  No  Handed:  Right  AIMS (if indicated):     Assets:  Communication Skills Desire for Improvement  ADL's:  Impaired  Cognition:  WNL  Sleep:  Number of Hours: 1.5     Treatment Plan Summary:  Ms. Corvera is a 67 year old Caucasian female with a prior diagnosis of bipolar disorder, most recent episode mixed as well as PTSD.  She is currently receiving ECT treatment from Dr. Weber Cooks.   Bipolar disorder, most recent episode mixed, PTSD: We will plan to continue Paxil 25 mg p.o. daily for depression and anxiety and increase trazodone to a total of 100 mg p.o. nightly for insomnia.  She also has Minipress 2mg  po nightly. She appears to have failed multiple mood stabilizers in the past.  Will continue ECT treatment per Dr. Algie Coffer   Chronic disc disorder, chronic pain: We will plan to continue fentanyl, gabapentin and Zanaflex  Hypertension: Vital signs are stable.  We will continue Norvasc 2.5 mg p.o. BID. VSS.  GERD: We will plan to continue pantoprazole 40 mg p.o. Daily  Hypothyroidism: Continue Synthroid 165mcg po daily  Anemia: continue Ferrous Sulfate.  Fall Risk: PT and OT working with patient. She has a walker but does not use it.  Disposition: Placement is pending.  She will need psychotropic medication management at the time of discharge.   Chauncey Mann, MD 10/27/2017, 1:05 PM

## 2017-10-27 NOTE — Progress Notes (Signed)
LCSW met with patient as she was finishing her lunch. Discussed safe DC options and will come back at 4 pm. Angela [name] gracefully is looking forward to our 4 pm meeting. LCSW will convince her that [facility] might be the safest alternative. Completed and filed out consent form for this [facility] and will bring it to our meeting.   [employer] LCSW [phone]

## 2017-10-27 NOTE — B[facility] Group Notes (Signed)
LCSW Group Therapy Note  10/27/2017 1:15pm  Type of Therapy and Topic:  Group Therapy:  Healthy Self Image and Positive Change  Participation Level:  Did Not Attend   Description of Group:  In this group, patients will compare and contrast their current "I am...." statements to the visions they identify as desirable for their lives.  Patients discuss fears and how they can make positive changes in their cognitions that will positively impact their behaviors.  Facilitator played a motivational 3-minute speech and patients were left with the task of thinking about what "I am...." statements they can start using in their lives immediately.  Therapeutic Goals: 1. Patient will state their current self-perception as expressed in an "I Am" statement 2. Patient will contrast this with their desired vision for their live 3. Patient will identify 3 fears that negatively impact their behavior 4. Patient will discuss cognitive distortions that stem from their fears 5. Patient will verbalize statements that challenge their cognitive distortions  Summary of Patient Progress:  Pt was invited to attend group but chose not to attend. CSW will continue to encourage pt to attend group throughout their admission.     Therapeutic Modalities Cognitive Behavioral Therapy Motivational Interviewing  Estha Few  [name], LCSW 10/27/2017 12:41 PM

## 2017-10-27 NOTE — Progress Notes (Signed)
LCSW completed new consent form for  [facility] in [place]. Investigated and printed hand off for patient 6 pages- she is going to look it over. I then presented her with the scenerio she will be discharged and as her own person, she can rent a room in motel and search the [place] list adds for a room mate- I reminded her she is her own guardian and this is an acute care facility and she will be linked with specialized grief counseling once she decides where to live.  I was polite but firm- I also created a step by step how to access the Internet /craig list or [employer] apartments for her to use in the future. I did not pressure her to sign consent form but explained the proce. Explained that  Once this consent is signed I have to send information to the [facility], they need to approve her, then they will meet her and then she can decide yes or no- I gently reminded her this is the safest alternative and they provide rehabilitative assistance once she has her ear surgery. I reminded patient if she gets there and she is not happy " she can go to motel 6 or the [facility]" she is the boss. I explained she needs a safe environment but at the end of the day, several housing options and assistance has been provided to her and now its time to DC and decide.  LCSW agreed to follow up with her tomorrow morning at [place]

## 2017-10-27 NOTE — Progress Notes (Signed)
Patient alert and oriented x 4, no distress noted, interacting appropriately with peers and staff, less intrusive, less somatic and not argumentive with  staff. Patient's affect is labile, denies pain or discomfort, appropriate eye contact, has multiple layers of clothes on, demes SI/HI/AVH and rated depression a 5/10 " l feel better l guess ECT works" . Patient receptive to treatment no distress noted, 15 minutes safety checks maintained will continue to monitor.

## 2017-10-28 ENCOUNTER — Other Ambulatory Visit: Payer: Self-pay | Admitting: Psychiatry

## 2017-10-28 NOTE — Progress Notes (Signed)
D: Patient stated slept good last night .Stated appetite is good and energy level  Is normal. Stated concentration is good . Denies suicidal  homicidal ideations  .  No auditory hallucinations  No pain concerns . Appropriate ADL'S. Interacting with peers and staff.  Able to verbalize positive feeling of self . The ability to make decisions is ongoing patient continue to need direction with this goal. Working on coping skills . Social Workers assisting with placement needs with resources available.Compliant with medication received . Denies suicidal ideations . Voice of no safety concerns .  Patient upset about  The decision she has to make  About  Her  Placement . Writer called cardiopulmonary for  Missed treatment Saturday when she slept most of shift . No distress observed refusal of treatment . A: Encourage patient participation with unit programming . Instruction  Given on  Medication , verbalize understanding. R: Voice no other concerns. Staff continue to monitor

## 2017-10-28 NOTE — B[facility] Group Notes (Signed)
LCSW Group Therapy Note 10/28/2017 1:15pm  Type of Therapy and Topic: Group Therapy: Feelings Around Returning Home & Establishing a Supportive Framework and Supporting Oneself When Supports Not Available  Participation Level: Did Not Attend  Description of Group:  Patients first processed thoughts and feelings about upcoming discharge. These included fears of upcoming changes, lack of change, new living environments, judgements and expectations from others and overall stigma of mental health issues. The group then discussed the definition of a supportive framework, what that looks and feels like, and how do to discern it from an unhealthy non-supportive network. The group identified different types of supports as well as what to do when your family/friends are less than helpful or unavailable  Therapeutic Goals  1. Patient will identify one healthy supportive network that they can use at discharge. 2. Patient will identify one factor of a supportive framework and how to tell it from an unhealthy network. 3. Patient able to identify one coping skill to use when they do not have positive supports from others. 4. Patient will demonstrate ability to communicate their needs through discussion and/or role plays.  Summary of Patient Progress:  Pt was invited to attend group but chose not to attend. CSW will continue to encourage pt to attend group throughout their admission.   Therapeutic Modalities Cognitive Behavioral Therapy Motivational Interviewing   Angela [name]  [name], LCSW 10/28/2017 12:07 PM

## 2017-10-28 NOTE — Plan of Care (Signed)
  Problem: Education: Goal: Ability to make informed decisions regarding treatment will improve Outcome: Progressing   

## 2017-10-28 NOTE — Plan of Care (Signed)
Cooperative and able to express needs and concerns. Compliant with treatment

## 2017-10-28 NOTE — Progress Notes (Signed)
Patient present in the milieu, pleasant and cooperative. Frequently presenting to the nurses station wanting to talk to nurses. No aggressive behaviors. No thoughts of self harm. Received her due medications and encouragement provided. Staff continue to monitor for safety.

## 2017-10-28 NOTE — Progress Notes (Signed)
Patient is alert and oriented x 4, no distress noted, thoughts are organized and coherent, interacting appropriately with peers and staff, affect is brighter, she's less irritable and less intrusive, very pleasant and polite with peers and staff. Patient is making good eye contact, and receptive to treatment plan. Patient visible in milieu, didn't attend evening wrap up group, compliant with medication. 15 minutes safety checks maintained, will continue to monitor

## 2017-10-28 NOTE — Plan of Care (Signed)
Patient suicidal ideations  Problem: Education: Goal: Ability to make informed decisions regarding treatment will improve 10/28/2017 0352 by Harl Bowie, RN Outcome: Progressing 10/28/2017 0352 by Harl Bowie, RN Outcome: Progressing

## 2017-10-28 NOTE — Progress Notes (Signed)
[facility] MD Progress Note  10/28/2017 12:12 PM Angela [name]  MRN:  [id number]     Subjective: Overall, the patient has been more pleasant and cooperative.  She denies any current active or passive suicidal thoughts or psychotic symptoms.  She does struggle with flashbacks related to prior trauma at times but no recent nightmares. She continues to be resistant to wanting to go to a group home and wants to be able to have a house built for her through [employer].  She has not had any visitors.  She did not attend afternoon group yesterday.  The patient did meet with quality and the social worker yesterday and time spent discussing the need for the patient to take some responsibility in finding housing.  The patient was bradycardic this morning.  Blood pressure was normal.  She slept 5 hours per nursing but is also sleeping on and off throughout the daytime.   Principal Problem: Bipolar I disorder, most recent episode mixed, severe with psychotic features ([place]) Diagnosis:   Patient Active Problem List   Diagnosis Date Noted  . Bipolar I disorder, most recent episode mixed, severe with psychotic features ([place]) [F31.64] 09/18/2017  . Agitation [R45.1] 09/18/2017  . IBS (irritable bowel syndrome) [K58.9] 11/17/2016  . Bilateral carpal tunnel syndrome [G56.03] 11/17/2016  . Plantar fasciitis, bilateral [M72.2] 11/17/2016  . Chronic pain syndrome [G89.4] 11/17/2016  . Chronic hyponatremia [E87.1] 11/17/2016  . PTSD (post-traumatic stress disorder) [F43.10] 11/17/2016  . Hernia, hiatal [K44.9] 11/17/2016  . Vitamin D deficiency [E55.9] 11/17/2016  . Vitamin B12 deficiency [E53.8] 11/17/2016  . SIADH (syndrome of inappropriate ADH production) ([place]) [E22.2] 09/08/2016  . Cervical spondylosis with myelopathy [M47.12] 05/24/2016  . Post-concussion headache [G44.309] 05/24/2016  . MCI (mild cognitive impairment) with memory loss [G31.84] 03/30/2016  . Gait abnormality [R26.9] 03/30/2016   . Chronic bipolar affective disorder ([place]) [F31.9] 01/24/2016  . Anemia, iron deficiency [D50.9] 06/24/2015  . Endometriosis [N80.9] 06/24/2015  . Essential hypertension [I10] 06/24/2015  . History of postmenopausal bleeding [Z87.42] 05/22/2015  . History of TIA (transient ischemic attack) [Z86.73] 04/20/2015  . Hypothyroidism [E03.9] 04/20/2015  . GERD (gastroesophageal reflux disease) [K21.9] 04/20/2015  . Cervical disc disorder with radiculopathy [M50.10] 03/25/2015  . Chronic neck pain [M54.2, G89.29] 03/25/2015  . Pelvic pain in female [R10.2] 10/30/2014  . History of skin cancer [Z85.828] 06/30/2014  . Chronic headaches [R51] 12/16/2013  . Neuropathy [G62.9] 12/16/2013   Total Time spent with patient: 20 minutes  Past Psychiatric History: Long-standing mental health problems with severe mood instability  Past Medical History:  Past Medical History:  Diagnosis Date  . Allergic rhinitis   . Anemia   . Blurred vision   . Depression   . Diverticulosis   . Endometriosis   . Falls   . GERD (gastroesophageal reflux disease)   . Hernia, hiatal   . Hypothyroidism   . IBS (irritable bowel syndrome)   . Malignant neoplasm of skin   . Migraine   . Neuropathy   . PTSD (post-traumatic stress disorder)   . Thyroid disease     Past Surgical History:  Procedure Laterality Date  . APPENDECTOMY    . CHOLECYSTECTOMY    . CHOLECYSTECTOMY, LAPAROSCOPIC    . ESOPHAGOGASTRODUODENOSCOPY (EGD) WITH PROPOFOL N/A 03/29/2015   Procedure: ESOPHAGOGASTRODUODENOSCOPY (EGD) WITH PROPOFOL;  Surgeon: Josefine Class, MD;  Location: [facility] ENDOSCOPY;  Service: Endoscopy;  Laterality: N/A;  . HERNIA REPAIR    . laproscopy    . TONSILLECTOMY    .  uteral suspension     Family History:  Family History  Problem Relation Age of Onset  . Heart disease Father   . Cancer Mother        lung  . Urolithiasis Neg Hx   . Kidney disease Neg Hx   . Kidney cancer Neg Hx   . Prostate cancer Neg Hx      Social History:  Social History   Substance and Sexual Activity  Alcohol Use No     Social History   Substance and Sexual Activity  Drug Use No    Social History   Socioeconomic History  . Marital status: Single    Spouse name: Not on file  . Number of children: 0  . Years of education: 1.5 years of college  . Highest education level: Not on file  Occupational History  . Occupation: Retired  Scientific laboratory technician  . Financial resource strain: Not on file  . Food insecurity:    Worry: Not on file    Inability: Not on file  . Transportation needs:    Medical: Not on file    Non-medical: Not on file  Tobacco Use  . Smoking status: Former Smoker    Last attempt to quit: 11/17/1976    Years since quitting: 40.9  . Smokeless tobacco: Never Used  Substance and Sexual Activity  . Alcohol use: No  . Drug use: No  . Sexual activity: Not Currently  Lifestyle  . Physical activity:    Days per week: Not on file    Minutes per session: Not on file  . Stress: Not on file  Relationships  . Social connections:    Talks on phone: Not on file    Gets together: Not on file    Attends religious service: Not on file    Active member of club or organization: Not on file    Attends meetings of clubs or organizations: Not on file    Relationship status: Not on file  Other Topics Concern  . Not on file  Social History Narrative   Lives at home alone.   Right-handed.   No daily caffeine use.   Additional Social History:    Pain Medications: See PTA Prescriptions: See PTA Over the Counter: See PTA History of alcohol / drug use?: No history of alcohol / drug abuse Longest period of sobriety (when/how long): Reports of none Negative Consequences of Use: (n/a) Withdrawal Symptoms: (n/a)                    Sleep: Fair  Appetite:  Fair  Current Medications: Current Facility-Administered Medications  Medication Dose Route Frequency Provider Last Rate Last Dose  .  acetaminophen (TYLENOL) tablet 650 mg  650 mg Oral Q6H PRN Clapacs, Madie Reno, MD   650 mg at 10/28/17 0002  . alum & mag hydroxide-simeth (MAALOX/MYLANTA) 200-200-20 MG/5ML suspension 30 mL  30 mL Oral Q4H PRN Clapacs, John T, MD   30 mL at 10/08/17 1507  . amLODipine (NORVASC) tablet 5 mg  5 mg Oral BID Clapacs, Madie Reno, MD   5 mg at 10/28/17 0903  . calcium carbonate (TUMS - dosed in mg elemental calcium) chewable tablet 400 mg of elemental calcium  2 tablet Oral Q breakfast Pucilowska, Jolanta B, MD   400 mg of elemental calcium at 10/28/17 0904  . cholecalciferol (VITAMIN D) tablet 1,000 Units  1,000 Units Oral Daily Pucilowska, Jolanta B, MD   1,000 Units at 10/28/17 0903  .  cycloSPORINE (RESTASIS) 0.05 % ophthalmic emulsion 1 drop  1 drop Both Eyes BID Clapacs, Madie Reno, MD   1 drop at 10/28/17 0810  . diclofenac sodium (VOLTAREN) 1 % transdermal gel 4 g  4 g Topical QID Pucilowska, Jolanta B, MD   4 g at 10/28/17 0905  . feeding supplement (BOOST / RESOURCE BREEZE) liquid 1 Container  1 Container Oral TID BM Clapacs, Madie Reno, MD   1 Container at 10/25/17 2059  . fentaNYL (SUBLIMAZE) injection 25 mcg  25 mcg Intravenous Q5 min PRN Alvin Critchley, MD      . ferrous sulfate tablet 325 mg  325 mg Oral Q breakfast Pucilowska, Jolanta B, MD   325 mg at 10/28/17 0903  . gabapentin (NEURONTIN) capsule 100 mg  100 mg Oral BID Clapacs, Madie Reno, MD   100 mg at 10/28/17 0905  . glycopyrrolate (ROBINUL) injection 0.1 mg  0.1 mg Intravenous Once Clapacs, John T, MD      . hydrocortisone cream 1 %   Topical BID Clapacs, John T, MD      . ipratropium (ATROVENT) 0.03 % nasal spray 2 spray  2 spray Each Nare TID He, Jun, MD   2 spray at 10/28/17 0906  . levalbuterol (XOPENEX) nebulizer solution 0.63 mg  0.63 mg Nebulization Q1400 Clapacs, Madie Reno, MD   0.63 mg at 10/24/17 1416  . levothyroxine (SYNTHROID, LEVOTHROID) tablet 100 mcg  100 mcg Oral QAC breakfast Pucilowska, Jolanta B, MD   100 mcg at 10/28/17 0637  .  lidocaine (LIDODERM) 5 % 1 patch  1 patch Transdermal Q24H Pucilowska, Jolanta B, MD   1 patch at 10/26/17 1800  . linaclotide (LINZESS) capsule 290 mcg  290 mcg Oral QAC breakfast Pucilowska, Jolanta B, MD   290 mcg at 10/28/17 0810  . lip balm (BLISTEX) ointment   Topical PRN Clapacs, John T, MD      . loperamide (IMODIUM) capsule 2 mg  2 mg Oral PRN Pucilowska, Jolanta B, MD   2 mg at 10/18/17 0002  . LORazepam (ATIVAN) tablet 0.5 mg  0.5 mg Oral Q6H PRN Clapacs, Madie Reno, MD   0.5 mg at 10/20/17 2134  . magnesium hydroxide (MILK OF MAGNESIA) suspension 30 mL  30 mL Oral Daily PRN Clapacs, Madie Reno, MD   30 mL at 10/05/17 1731  . magnesium oxide (MAG-OX) tablet 200 mg  200 mg Oral BID Pucilowska, Jolanta B, MD   200 mg at 10/28/17 0907  . midazolam (VERSED) injection 2 mg  2 mg Intravenous Once Clapacs, John T, MD      . multivitamin with minerals tablet 1 tablet  1 tablet Oral Daily Pucilowska, Jolanta B, MD   1 tablet at 10/28/17 0904  . omega-3 acid ethyl esters (LOVAZA) capsule 1 g  1 g Oral BID Pucilowska, Jolanta B, MD   1 g at 10/28/17 0907  . ondansetron (ZOFRAN) injection 4 mg  4 mg Intravenous Once Clapacs, John T, MD      . ondansetron [facility]) injection 4 mg  4 mg Intravenous Once PRN Gunnar Fusi, MD      . ondansetron [place]) injection 4 mg  4 mg Intravenous Once PRN Gunnar Bulla, MD      . ondansetron [place]) injection 4 mg  4 mg Intravenous Once PRN Alvin Critchley, MD      . ondansetron [facility]) injection 4 mg  4 mg Intravenous Once Clapacs, Madie Reno, MD      . ondansetron (  ZOFRAN) injection 4 mg  4 mg Intravenous Once PRN Alvin Critchley, MD      . ondansetron (ZOFRAN-ODT) disintegrating tablet 4 mg  4 mg Oral Q8H PRN Pucilowska, Jolanta B, MD   4 mg at 10/08/17 1807  . PARoxetine (PAXIL-CR) 24 hr tablet 25 mg  25 mg Oral Daily Clapacs, Madie Reno, MD   25 mg at 10/28/17 0907  . polyethylene glycol (MIRALAX / GLYCOLAX) packet 17 g  17 g Oral Daily Pucilowska, Jolanta B, MD   17 g  at 10/26/17 1502  . polyvinyl alcohol (LIQUIFILM TEARS) 1.4 % ophthalmic solution 1 drop  1 drop Both Eyes PRN Ramond Dial, MD   1 drop at 10/25/17 1840  . prazosin (MINIPRESS) capsule 2 mg  2 mg Oral QHS Clapacs, John T, MD   2 mg at 10/27/17 2200  . promethazine (PHENERGAN) tablet 12.5 mg  12.5 mg Oral Q6H PRN Clapacs, John T, MD   12.5 mg at 10/24/17 1234  . pyridOXINE (VITAMIN B-6) tablet 100 mg  100 mg Oral BID Clapacs, Madie Reno, MD   100 mg at 10/28/17 0908  . tiZANidine (ZANAFLEX) tablet 2 mg  2 mg Oral Q8H PRN Pucilowska, Jolanta B, MD   2 mg at 10/21/17 1600  . traZODone (DESYREL) tablet 100 mg  100 mg Oral QHS Clapacs, John T, MD   100 mg at 10/27/17 2200  . triamcinolone cream (KENALOG) 0.1 %   Topical TID PRN Ramond Dial, MD   1 application at 00/86/76 2106  . vitamin C (ASCORBIC ACID) tablet 1,000 mg  1,000 mg Oral Daily Pucilowska, Jolanta B, MD   1,000 mg at 10/28/17 0909    Lab Results:  No results found for this or any previous visit (from the past 48 hour(s)).  Blood Alcohol level:  Lab Results  Component Value Date   ETH <5 09/29/2016   ETH <5 19/50/9326    Metabolic Disorder Labs: Lab Results  Component Value Date   HGBA1C 5.7 (H) 09/20/2017   MPG 116.89 09/20/2017   No results found for: PROLACTIN Lab Results  Component Value Date   CHOL 173 09/20/2017   TRIG 193 (H) 09/20/2017   HDL 50 09/20/2017   CHOLHDL 3.5 09/20/2017   VLDL 39 09/20/2017   LDLCALC 84 09/20/2017   LDLCALC 87 09/04/2017    Physical Findings: AIMS: Facial and Oral Movements Muscles of Facial Expression: None, normal Lips and Perioral Area: None, normal Jaw: None, normal Tongue: None, normal,Extremity Movements Upper (arms, wrists, hands, fingers): None, normal Lower (legs, knees, ankles, toes): None, normal, Trunk Movements Neck, shoulders, hips: None, normal, Overall Severity Severity of abnormal movements (highest score from questions above): None,  normal Incapacitation due to abnormal movements: None, normal Patient's awareness of abnormal movements (rate only patient's report): No Awareness, Dental Status Current problems with teeth and/or dentures?: No Does patient usually wear dentures?: No      Musculoskeletal: Strength & Muscle Tone: within normal limits Gait & Station: normal Patient leans: N/A  Psychiatric Specialty Exam: Physical Exam  Nursing note and vitals reviewed.   Review of Systems  Constitutional: Negative.   HENT: Negative.   Eyes: Negative.   Respiratory: Negative.   Cardiovascular: Negative.   Gastrointestinal: Negative.   Musculoskeletal: Negative.   Skin: Negative.   Neurological: Negative.   Psychiatric/Behavioral: Negative.     Blood pressure 112/63, pulse (!) 50, temperature 98 F (36.7 C), temperature source Oral, resp. rate 18, height 5\' 3"  (1.6 m), weight  53.1 kg, SpO2 100 %.Body mass index is 20.73 kg/m.  General Appearance: Disheveled  Eye Contact: Good  Speech:  Clear and coherent, normal rate  Volume: Normal  Mood:  "I am fine"  Affect:  Restricted with this writer but labile over past 24 hours  Thought Process:  Logical and goal directed  Orientation:  Full (Time, Place, and Person)  Thought Content:  logical and goal directed  Suicidal Thoughts:  No  Homicidal Thoughts:  No  Memory:  Immediate;   Fair Recent;   Poor Remote;   Fair  Judgement:  Fair  Insight:  Fair  Psychomotor Activity:  Decreased  Concentration:  Concentration: Fair  Recall:  [name]:  Fair  Language:  Fair  Akathisia:  No  Handed:  Right  AIMS (if indicated):     Assets:  Communication Skills Desire for Improvement  ADL's:  Impaired  Cognition:  WNL  Sleep:  Number of Hours: 5     Treatment Plan Summary:  Ms. Shelden is a 67 year old Caucasian female with a prior diagnosis of bipolar disorder, most recent episode mixed as well as PTSD.  She is currently receiving ECT treatment  from Dr. Weber Cooks.   Bipolar disorder, most recent episode mixed, PTSD:  -She will continue Paxil 25 mg p.o. daily for depression and anxiety and Tazodone 100 mg p.o. nightly for insomnia. - She also has Minipress 2mg  po nightly for PTSD - She appears to have failed multiple mood stabilizers in the past.  - Will continue ECT treatment per Dr. Algie Coffer. The patient feels ECT helps.   Chronic disc disorder, chronic pain: We will plan to continue fentanyl, gabapentin and Zanaflex.    Hypertension: Patient had bradycardia this morning and will repeat vital signs. We will continue Norvasc 2.5 mg p.o. BID.  GERD: We will continue plan milligrams p.o. daily  Hypothyroidism: Continue Synthroid 100 mcg p.o. daily  Anemia: continue Ferrous Sulfate.  Fall Risk: PT and OT working with patient. She has a walker but does not use it.  Disposition: Placement is pending.  She will need psychotropic medication management at the time of discharge.   Chauncey Mann, MD 10/28/2017, 12:12 PM

## 2017-10-28 NOTE — Plan of Care (Signed)
Able to verbalize positive feeling of self . The ability to make decisions is ongoing  patient continue to need  direction with this goal. Working on coping  skills . Social Workers assisting with  placement  needs  with resources available.Compliant  with medication received . Denies suicidal  ideations . Voice of no safety concerns .    Problem: Education: Goal: Ability to make informed decisions regarding treatment will improve Outcome: Progressing   Problem: Coping: Goal: Coping ability will improve Outcome: Progressing   Problem: Health Behavior/Discharge Planning: Goal: Identification of resources available to assist in meeting health care needs will improve Outcome: Progressing   Problem: Medication: Goal: Compliance with prescribed medication regimen will improve Outcome: Progressing   Problem: Self-Concept: Goal: Ability to disclose and discuss suicidal ideas will improve Outcome: Progressing Goal: Will verbalize positive feelings about self Outcome: Progressing   Problem: Safety: Goal: Ability to remain free from injury will improve Outcome: Progressing   Problem: Spiritual Needs Goal: Ability to function at adequate level Outcome: Progressing

## 2017-10-29 ENCOUNTER — Inpatient Hospital Stay: Payer: Medicare Other | Admitting: Certified Registered Nurse Anesthetist

## 2017-10-29 LAB — GLUCOSE, CAPILLARY
GLUCOSE-CAPILLARY: 86 mg/dL (ref 70–99)
Glucose-Capillary: 78 mg/dL (ref 70–99)

## 2017-10-29 MED ORDER — SUCCINYLCHOLINE CHLORIDE 20 MG/ML IJ SOLN
INTRAMUSCULAR | Status: AC
  Filled 2017-10-29: qty 1

## 2017-10-29 MED ORDER — KETOROLAC TROMETHAMINE 30 MG/ML IJ SOLN
30.0000 mg | Freq: Once | INTRAMUSCULAR | Status: AC
  Administered 2017-10-29: 30 mg via INTRAVENOUS

## 2017-10-29 MED ORDER — SUCCINYLCHOLINE CHLORIDE 20 MG/ML IJ SOLN
INTRAMUSCULAR | Status: DC | PRN
  Administered 2017-10-29: 80 mg via INTRAVENOUS

## 2017-10-29 MED ORDER — KETAMINE HCL 50 MG/ML IJ SOLN
INTRAMUSCULAR | Status: AC
  Filled 2017-10-29: qty 10

## 2017-10-29 MED ORDER — SODIUM CHLORIDE 0.9 % IV SOLN
500.0000 mL | Freq: Once | INTRAVENOUS | Status: AC
  Administered 2017-10-29: 500 mL via INTRAVENOUS

## 2017-10-29 MED ORDER — SODIUM CHLORIDE 0.9 % IV SOLN
INTRAVENOUS | Status: DC | PRN
  Administered 2017-10-29: 11:00:00 via INTRAVENOUS

## 2017-10-29 MED ORDER — KETOROLAC TROMETHAMINE 30 MG/ML IJ SOLN
INTRAMUSCULAR | Status: AC
  Filled 2017-10-29: qty 1

## 2017-10-29 MED ORDER — KETAMINE HCL 10 MG/ML IJ SOLN
INTRAMUSCULAR | Status: DC | PRN
  Administered 2017-10-29: 50 mg via INTRAVENOUS

## 2017-10-29 NOTE — Progress Notes (Signed)
Patient stayed awake for a while and complained of neck pain, requiring  Tylenol. Slept 3.45 hours. Currently NPO for ECT. Currently sleeping. Safety precautions maintained.

## 2017-10-29 NOTE — Anesthesia Procedure Notes (Signed)
Procedure Name: General with mask airway Date/Time: 10/29/2017 10:37 AM Performed by: Johnna Acosta, CRNA Pre-anesthesia Checklist: Patient identified, Timeout performed, Emergency Drugs available, Suction available and Patient being monitored Patient Re-evaluated:Patient Re-evaluated prior to induction Oxygen Delivery Method: Circle system utilized Preoxygenation: Pre-oxygenation with 100% oxygen Induction Type: IV induction Ventilation: Mask ventilation without difficulty

## 2017-10-29 NOTE — Progress Notes (Signed)
[facility] MD Progress Note  10/29/2017 5:38 PM Angela [name]  MRN:  [id number] Subjective: "I suppose I am all right" patient seen chart reviewed.  Patient had ECT today.  Patient continues to show improvement versus baseline.  Improved presumably in large part from ECT.  Still refusing mood stabilizers.  Patient denies suicidal thoughts does not report any psychotic symptoms.  Somatic complaints are kept to a minimum. Principal Problem: Bipolar I disorder, most recent episode mixed, severe with psychotic features ([place]) Diagnosis:   Patient Active Problem List   Diagnosis Date Noted  . Bipolar I disorder, most recent episode mixed, severe with psychotic features ([employer]) [F31.64] 09/18/2017  . Agitation [R45.1] 09/18/2017  . IBS (irritable bowel syndrome) [K58.9] 11/17/2016  . Bilateral carpal tunnel syndrome [G56.03] 11/17/2016  . Plantar fasciitis, bilateral [M72.2] 11/17/2016  . Chronic pain syndrome [G89.4] 11/17/2016  . Chronic hyponatremia [E87.1] 11/17/2016  . PTSD (post-traumatic stress disorder) [F43.10] 11/17/2016  . Hernia, hiatal [K44.9] 11/17/2016  . Vitamin D deficiency [E55.9] 11/17/2016  . Vitamin B12 deficiency [E53.8] 11/17/2016  . SIADH (syndrome of inappropriate ADH production) ([place]) [E22.2] 09/08/2016  . Cervical spondylosis with myelopathy [M47.12] 05/24/2016  . Post-concussion headache [G44.309] 05/24/2016  . MCI (mild cognitive impairment) with memory loss [G31.84] 03/30/2016  . Gait abnormality [R26.9] 03/30/2016  . Chronic bipolar affective disorder ([place]) [F31.9] 01/24/2016  . Anemia, iron deficiency [D50.9] 06/24/2015  . Endometriosis [N80.9] 06/24/2015  . Essential hypertension [I10] 06/24/2015  . History of postmenopausal bleeding [Z87.42] 05/22/2015  . History of TIA (transient ischemic attack) [Z86.73] 04/20/2015  . Hypothyroidism [E03.9] 04/20/2015  . GERD (gastroesophageal reflux disease) [K21.9] 04/20/2015  . Cervical disc disorder with radiculopathy  [M50.10] 03/25/2015  . Chronic neck pain [M54.2, G89.29] 03/25/2015  . Pelvic pain in female [R10.2] 10/30/2014  . History of skin cancer [Z85.828] 06/30/2014  . Chronic headaches [R51] 12/16/2013  . Neuropathy [G62.9] 12/16/2013   Total Time spent with patient: 20 minutes  Past Psychiatric History: Long-standing history of mental health problems  Past Medical History:  Past Medical History:  Diagnosis Date  . Allergic rhinitis   . Anemia   . Blurred vision   . Depression   . Diverticulosis   . Endometriosis   . Falls   . GERD (gastroesophageal reflux disease)   . Hernia, hiatal   . Hypothyroidism   . IBS (irritable bowel syndrome)   . Malignant neoplasm of skin   . Migraine   . Neuropathy   . PTSD (post-traumatic stress disorder)   . Thyroid disease     Past Surgical History:  Procedure Laterality Date  . APPENDECTOMY    . CHOLECYSTECTOMY    . CHOLECYSTECTOMY, LAPAROSCOPIC    . ESOPHAGOGASTRODUODENOSCOPY (EGD) WITH PROPOFOL N/A 03/29/2015   Procedure: ESOPHAGOGASTRODUODENOSCOPY (EGD) WITH PROPOFOL;  Surgeon: Josefine Class, MD;  Location: [facility] ENDOSCOPY;  Service: Endoscopy;  Laterality: N/A;  . HERNIA REPAIR    . laproscopy    . TONSILLECTOMY    . uteral suspension     Family History:  Family History  Problem Relation Age of Onset  . Heart disease Father   . Cancer Mother        lung  . Urolithiasis Neg Hx   . Kidney disease Neg Hx   . Kidney cancer Neg Hx   . Prostate cancer Neg Hx    Family Psychiatric  History: See previous note Social History:  Social History   Substance and Sexual Activity  Alcohol Use No  Social History   Substance and Sexual Activity  Drug Use No    Social History   Socioeconomic History  . Marital status: Single    Spouse name: Not on file  . Number of children: 0  . Years of education: 1.5 years of college  . Highest education level: Not on file  Occupational History  . Occupation: Retired  Scientific laboratory technician  .  Financial resource strain: Not on file  . Food insecurity:    Worry: Not on file    Inability: Not on file  . Transportation needs:    Medical: Not on file    Non-medical: Not on file  Tobacco Use  . Smoking status: Former Smoker    Last attempt to quit: 11/17/1976    Years since quitting: 40.9  . Smokeless tobacco: Never Used  Substance and Sexual Activity  . Alcohol use: No  . Drug use: No  . Sexual activity: Not Currently  Lifestyle  . Physical activity:    Days per week: Not on file    Minutes per session: Not on file  . Stress: Not on file  Relationships  . Social connections:    Talks on phone: Not on file    Gets together: Not on file    Attends religious service: Not on file    Active member of club or organization: Not on file    Attends meetings of clubs or organizations: Not on file    Relationship status: Not on file  Other Topics Concern  . Not on file  Social History Narrative   Lives at home alone.   Right-handed.   No daily caffeine use.   Additional Social History:    Pain Medications: See PTA Prescriptions: See PTA Over the Counter: See PTA History of alcohol / drug use?: No history of alcohol / drug abuse Longest period of sobriety (when/how long): Reports of none Negative Consequences of Use: (n/a) Withdrawal Symptoms: (n/a)                    Sleep: Fair  Appetite:  Fair  Current Medications: Current Facility-Administered Medications  Medication Dose Route Frequency Provider Last Rate Last Dose  . acetaminophen (TYLENOL) tablet 650 mg  650 mg Oral Q6H PRN Khadeja Abt, Madie Reno, MD   650 mg at 10/28/17 2356  . alum & mag hydroxide-simeth (MAALOX/MYLANTA) 200-200-20 MG/5ML suspension 30 mL  30 mL Oral Q4H PRN Codie Hainer T, MD   30 mL at 10/08/17 1507  . amLODipine (NORVASC) tablet 5 mg  5 mg Oral BID Kayana Thoen, Madie Reno, MD   5 mg at 10/28/17 1721  . calcium carbonate (TUMS - dosed in mg elemental calcium) chewable tablet 400 mg of elemental  calcium  2 tablet Oral Q breakfast Pucilowska, Jolanta B, MD   400 mg of elemental calcium at 10/28/17 0904  . cholecalciferol (VITAMIN D) tablet 1,000 Units  1,000 Units Oral Daily Pucilowska, Jolanta B, MD   1,000 Units at 10/28/17 0903  . cycloSPORINE (RESTASIS) 0.05 % ophthalmic emulsion 1 drop  1 drop Both Eyes BID Lachlyn Vanderstelt, Madie Reno, MD   1 drop at 10/29/17 0948  . diclofenac sodium (VOLTAREN) 1 % transdermal gel 4 g  4 g Topical QID Pucilowska, Jolanta B, MD   4 g at 10/28/17 2223  . feeding supplement (BOOST / RESOURCE BREEZE) liquid 1 Container  1 Container Oral TID BM Andreu Drudge, Madie Reno, MD   1 Container at 10/28/17 1955  . fentaNYL (  SUBLIMAZE) injection 25 mcg  25 mcg Intravenous Q5 min PRN Alvin Critchley, MD      . ferrous sulfate tablet 325 mg  325 mg Oral Q breakfast Pucilowska, Jolanta B, MD   325 mg at 10/28/17 0903  . gabapentin (NEURONTIN) capsule 100 mg  100 mg Oral BID Imonie Tuch, Madie Reno, MD   100 mg at 10/28/17 1720  . glycopyrrolate (ROBINUL) injection 0.1 mg  0.1 mg Intravenous Once Nikiyah Fackler T, MD      . hydrocortisone cream 1 %   Topical BID Orlinda Slomski T, MD      . ipratropium (ATROVENT) 0.03 % nasal spray 2 spray  2 spray Each Nare TID He, Jun, MD   2 spray at 10/28/17 1722  . ketorolac (TORADOL) 30 MG/ML injection           . levalbuterol (XOPENEX) nebulizer solution 0.63 mg  0.63 mg Nebulization Q1400 Mikalyn Hermida, Madie Reno, MD   0.63 mg at 10/28/17 1759  . levothyroxine (SYNTHROID, LEVOTHROID) tablet 100 mcg  100 mcg Oral QAC breakfast Pucilowska, Jolanta B, MD   Stopped at 10/29/17 0646  . lidocaine (LIDODERM) 5 % 1 patch  1 patch Transdermal Q24H Pucilowska, Jolanta B, MD   1 patch at 10/26/17 1800  . linaclotide (LINZESS) capsule 290 mcg  290 mcg Oral QAC breakfast Pucilowska, Jolanta B, MD   Stopped at 10/29/17 0646  . lip balm (BLISTEX) ointment   Topical PRN Hriday Stai T, MD      . loperamide (IMODIUM) capsule 2 mg  2 mg Oral PRN Pucilowska, Jolanta B, MD   2 mg at  10/18/17 0002  . LORazepam (ATIVAN) tablet 0.5 mg  0.5 mg Oral Q6H PRN Jamee Keach, Madie Reno, MD   0.5 mg at 10/20/17 2134  . magnesium hydroxide (MILK OF MAGNESIA) suspension 30 mL  30 mL Oral Daily PRN Taitum Menton, Madie Reno, MD   30 mL at 10/05/17 1731  . magnesium oxide (MAG-OX) tablet 200 mg  200 mg Oral BID Pucilowska, Jolanta B, MD   200 mg at 10/28/17 1717  . midazolam (VERSED) injection 2 mg  2 mg Intravenous Once Mary Hockey T, MD      . multivitamin with minerals tablet 1 tablet  1 tablet Oral Daily Pucilowska, Jolanta B, MD   1 tablet at 10/28/17 0904  . omega-3 acid ethyl esters (LOVAZA) capsule 1 g  1 g Oral BID Pucilowska, Jolanta B, MD   1 g at 10/28/17 1718  . ondansetron (ZOFRAN) injection 4 mg  4 mg Intravenous Once Olvin Rohr T, MD      . ondansetron [facility]) injection 4 mg  4 mg Intravenous Once PRN Gunnar Fusi, MD      . ondansetron [facility]) injection 4 mg  4 mg Intravenous Once PRN Gunnar Bulla, MD      . ondansetron [facility]) injection 4 mg  4 mg Intravenous Once PRN Alvin Critchley, MD      . ondansetron [facility]) injection 4 mg  4 mg Intravenous Once Torell Minder T, MD      . ondansetron [facility]) injection 4 mg  4 mg Intravenous Once PRN Alvin Critchley, MD      . ondansetron (ZOFRAN-ODT) disintegrating tablet 4 mg  4 mg Oral Q8H PRN Pucilowska, Jolanta B, MD   4 mg at 10/08/17 1807  . PARoxetine (PAXIL-CR) 24 hr tablet 25 mg  25 mg Oral Daily Leory Allinson, Madie Reno, MD   25 mg at 10/28/17 0907  .  polyethylene glycol (MIRALAX / GLYCOLAX) packet 17 g  17 g Oral Daily Pucilowska, Jolanta B, MD   17 g at 10/26/17 1502  . polyvinyl alcohol (LIQUIFILM TEARS) 1.4 % ophthalmic solution 1 drop  1 drop Both Eyes PRN Ramond Dial, MD   1 drop at 10/25/17 1840  . prazosin (MINIPRESS) capsule 2 mg  2 mg Oral QHS Fradel Baldonado, Madie Reno, MD   2 mg at 10/28/17 2223  . promethazine (PHENERGAN) tablet 12.5 mg  12.5 mg Oral Q6H PRN Iesha Summerhill T, MD   12.5 mg at 10/24/17 1234  . pyridOXINE (VITAMIN  B-6) tablet 100 mg  100 mg Oral BID Mairead Schwarzkopf, Madie Reno, MD   100 mg at 10/28/17 1719  . tiZANidine (ZANAFLEX) tablet 2 mg  2 mg Oral Q8H PRN Pucilowska, Jolanta B, MD   2 mg at 10/21/17 1600  . traZODone (DESYREL) tablet 100 mg  100 mg Oral QHS Tiawana Forgy, Madie Reno, MD   100 mg at 10/28/17 2223  . triamcinolone cream (KENALOG) 0.1 %   Topical TID PRN Ramond Dial, MD   1 application at 71/69/67 2106  . vitamin C (ASCORBIC ACID) tablet 1,000 mg  1,000 mg Oral Daily Pucilowska, Jolanta B, MD   1,000 mg at 10/28/17 0909    Lab Results:  Results for orders placed or performed during the hospital encounter of 09/19/17 (from the past 48 hour(s))  Glucose, capillary     Status: None   Collection Time: 10/29/17  6:59 AM  Result Value Ref Range   Glucose-Capillary 78 70 - 99 mg/dL   Comment 1 Notify RN   Glucose, capillary     Status: None   Collection Time: 10/29/17 11:16 AM  Result Value Ref Range   Glucose-Capillary 86 70 - 99 mg/dL    Blood Alcohol level:  Lab Results  Component Value Date   ETH <5 09/29/2016   ETH <5 89/38/1017    Metabolic Disorder Labs: Lab Results  Component Value Date   HGBA1C 5.7 (H) 09/20/2017   MPG 116.89 09/20/2017   No results found for: PROLACTIN Lab Results  Component Value Date   CHOL 173 09/20/2017   TRIG 193 (H) 09/20/2017   HDL 50 09/20/2017   CHOLHDL 3.5 09/20/2017   VLDL 39 09/20/2017   LDLCALC 84 09/20/2017   LDLCALC 87 09/04/2017    Physical Findings: AIMS: Facial and Oral Movements Muscles of Facial Expression: None, normal Lips and Perioral Area: None, normal Jaw: None, normal Tongue: None, normal,Extremity Movements Upper (arms, wrists, hands, fingers): None, normal Lower (legs, knees, ankles, toes): None, normal, Trunk Movements Neck, shoulders, hips: None, normal, Overall Severity Severity of abnormal movements (highest score from questions above): None, normal Incapacitation due to abnormal movements: None, normal Patient's  awareness of abnormal movements (rate only patient's report): No Awareness, Dental Status Current problems with teeth and/or dentures?: No Does patient usually wear dentures?: No  CIWA:    COWS:     Musculoskeletal: Strength & Muscle Tone: within normal limits Gait & Station: normal Patient leans: N/A  Psychiatric Specialty Exam: Physical Exam  Nursing note and vitals reviewed. Constitutional: She appears well-developed and well-nourished.  HENT:  Head: Normocephalic and atraumatic.  Eyes: Pupils are equal, round, and reactive to light. Conjunctivae are normal.  Neck: Normal range of motion.  Cardiovascular: Regular rhythm and normal heart sounds.  Respiratory: Effort normal. No respiratory distress.  GI: Soft.  Musculoskeletal: Normal range of motion.  Neurological: She is alert.  Skin: Skin  is warm and dry.  Psychiatric: She has a normal mood and affect. Her speech is normal and behavior is normal. Judgment and thought content normal. Her affect is not blunt. Cognition and memory are normal.    Review of Systems  Constitutional: Negative.   HENT: Negative.   Eyes: Negative.   Respiratory: Negative.   Cardiovascular: Negative.   Gastrointestinal: Negative.   Musculoskeletal: Negative.   Skin: Negative.   Neurological: Negative.   Psychiatric/Behavioral: Negative.     Blood pressure (!) 143/70, pulse 68, temperature 98.7 F (37.1 C), temperature source Oral, resp. rate 16, height 5\' 3"  (1.6 m), weight 53 kg, SpO2 97 %.Body mass index is 20.7 kg/m.  General Appearance: Casual  Eye Contact:  Good  Speech:  Clear and Coherent  Volume:  Normal  Mood:  Euthymic  Affect:  Constricted  Thought Process:  Goal Directed  Orientation:  Full (Time, Place, and Person)  Thought Content:  Logical  Suicidal Thoughts:  No  Homicidal Thoughts:  No  Memory:  Immediate;   Fair Recent;   Fair Remote;   Fair  Judgement:  Fair  Insight:  Fair  Psychomotor Activity:  Normal   Concentration:  Concentration: Fair  Recall:  [name]:  Fair  Language:  Fair  Akathisia:  No  Handed:  Right  AIMS (if indicated):     Assets:  Desire for Improvement Physical Health  ADL's:  Impaired  Cognition:  WNL  Sleep:  Number of Hours: 3.45     Treatment Plan Summary: Medication management and Plan Patient continues to be at some risk of cognitive impairment related to ECT.  Therefore we will skip Wednesdays treatment and only treat her again Friday if she is still in the hospital.  Meanwhile the only reason patient continues to be in the hospital really is a lack of any safe disposition.  Treatment team will continue to work on discharge planning.  Alethia Berthold, MD 10/29/2017, 5:38 PM

## 2017-10-29 NOTE — Progress Notes (Signed)
Attempted to administer 1400 nebulizer, pt stated she was tired and asked if I could come back at 1600

## 2017-10-29 NOTE — Anesthesia Preprocedure Evaluation (Signed)
Anesthesia Evaluation  Patient identified by MRN, date of birth, ID band Patient awake    Reviewed: Allergy & Precautions, NPO status , Patient's Chart, lab work & pertinent test results  History of Anesthesia Complications Negative for: history of anesthetic complications  Airway Mallampati: II  TM Distance: >3 FB Neck ROM: Full    Dental  (+) Poor Dentition   Pulmonary neg sleep apnea, neg COPD, former smoker,    breath sounds clear to auscultation- rhonchi (-) wheezing      Cardiovascular hypertension, (-) CAD, (-) Past MI, (-) Cardiac Stents and (-) CABG  Rhythm:Regular Rate:Normal - Systolic murmurs and - Diastolic murmurs    Neuro/Psych  Headaches, PSYCHIATRIC DISORDERS Anxiety Depression Bipolar Disorder    GI/Hepatic Neg liver ROS, hiatal hernia, GERD  ,  Endo/Other  neg diabetesHypothyroidism   Renal/GU negative Renal ROS     Musculoskeletal negative musculoskeletal ROS (+)   Abdominal (+) - obese,   Peds  Hematology  (+) anemia ,   Anesthesia Other Findings Past Medical History: No date: Allergic rhinitis No date: Anemia No date: Blurred vision No date: Depression No date: Diverticulosis No date: Endometriosis No date: Falls No date: GERD (gastroesophageal reflux disease) No date: Hernia, hiatal No date: Hypothyroidism No date: IBS (irritable bowel syndrome) No date: Malignant neoplasm of skin No date: Migraine No date: Neuropathy No date: PTSD (post-traumatic stress disorder) No date: Thyroid disease   Reproductive/Obstetrics                             Anesthesia Physical  Anesthesia Plan  ASA: II  Anesthesia Plan: General   Post-op Pain Management:    Induction: Intravenous  PONV Risk Score and Plan: 2 and Promethazine  Airway Management Planned: Mask  Additional Equipment:   Intra-op Plan:   Post-operative Plan:   Informed Consent: I have reviewed  the patients History and Physical, chart, labs and discussed the procedure including the risks, benefits and alternatives for the proposed anesthesia with the patient or authorized representative who has indicated his/her understanding and acceptance.   Dental advisory given  Plan Discussed with: CRNA and Anesthesiologist  Anesthesia Plan Comments:         Anesthesia Quick Evaluation

## 2017-10-29 NOTE — Progress Notes (Signed)
Pt sleeping at this time. Breathe sounds clear diminished. No treatment given at this time.

## 2017-10-29 NOTE — Procedures (Signed)
ECT SERVICES Physician's Interval Evaluation & Treatment Note  Patient Identification: Angela [name] MRN:  [id number] Date of Evaluation:  10/29/2017 TX #: [id number]  MADRS:   MMSE:   P.E. Findings:  No change to physical exam  Psychiatric Interval Note:  Stable seems to be improved  Subjective:  Patient is a 67 y.o. female seen for evaluation for Electroconvulsive Therapy. No specific complaint  Treatment Summary:   [x]   Right Unilateral             []  Bilateral   % Energy : 0.3 ms 100%   Impedance: 2250 ohms  Seizure Energy Index: 5292 V squared  Postictal Suppression Index: 90%  Seizure Concordance Index: 94%  Medications  Pre Shock: Toradol 30 mg ketamine 50 mg succinylcholine 80 mg  Post Shock:    Seizure Duration: 31 seconds EMG 44 seconds EEG   Comments: Follow-up Friday  Lungs:  [x]   Clear to auscultation               []  Other:   Heart:    [x]   Regular rhythm             []  irregular rhythm    [x]   Previous H&P reviewed, patient examined and there are NO CHANGES                 []   Previous H&P reviewed, patient examined and there are changes noted.   Alethia Berthold, MD 10/14/201910:40 AM

## 2017-10-29 NOTE — H&P (Signed)
Angela [name] is an 67 y.o. female.   none Chief Complaint: none HPI: see previous  Past Medical History:  Diagnosis Date  . Allergic rhinitis   . Anemia   . Blurred vision   . Depression   . Diverticulosis   . Endometriosis   . Falls   . GERD (gastroesophageal reflux disease)   . Hernia, hiatal   . Hypothyroidism   . IBS (irritable bowel syndrome)   . Malignant neoplasm of skin   . Migraine   . Neuropathy   . PTSD (post-traumatic stress disorder)   . Thyroid disease     Past Surgical History:  Procedure Laterality Date  . APPENDECTOMY    . CHOLECYSTECTOMY    . CHOLECYSTECTOMY, LAPAROSCOPIC    . ESOPHAGOGASTRODUODENOSCOPY (EGD) WITH PROPOFOL N/A 03/29/2015   Procedure: ESOPHAGOGASTRODUODENOSCOPY (EGD) WITH PROPOFOL;  Surgeon: Josefine Class, MD;  Location: [facility] ENDOSCOPY;  Service: Endoscopy;  Laterality: N/A;  . HERNIA REPAIR    . laproscopy    . TONSILLECTOMY    . uteral suspension      Family History  Problem Relation Age of Onset  . Heart disease Father   . Cancer Mother        lung  . Urolithiasis Neg Hx   . Kidney disease Neg Hx   . Kidney cancer Neg Hx   . Prostate cancer Neg Hx    Social History:  reports that she quit smoking about 40 years ago. She has never used smokeless tobacco. She reports that she does not drink alcohol or use drugs.  Allergies:  Allergies  Allergen Reactions  . Bee Venom Hives  . Darvon [Propoxyphene] Anaphylaxis and Hives    Hives/throat swelling   . Dicyclomine Itching  . Other Hives    Spider bites cause hives  . Oxycodone-Acetaminophen Nausea And Vomiting and Other (See Comments)    hallucinations   . Peanuts [Peanut Oil] Anaphylaxis and Hives  . Penicillins Hives    Has patient had a PCN reaction causing immediate rash, facial/tongue/throat swelling, SOB or lightheadedness with hypotension: Unknown Has patient had a PCN reaction causing severe rash involving mucus membranes or skin necrosis: Unknown Has  patient had a PCN reaction that required hospitalization: Unknown Has patient had a PCN reaction occurring within the last 10 years: Unknown If all of the above answers are "NO", then may proceed with Cephalosporin use.  . Sulfa Antibiotics Anaphylaxis, Hives and Itching  . Sulfacetamide Sodium Anaphylaxis, Hives and Itching  . Ultram [Tramadol] Hives  . Amikacin Nausea And Vomiting  . Aspirin Hives  . Doxycycline Photosensitivity  . Haloperidol Nausea And Vomiting  . Hymenoptera Venom Preparations Hives    Reaction to spider bites  . Imipramine Hives    Reaction to Tofranil  . Keflex [Cephalexin] Hives  . Lactose Intolerance (Gi)   . Sulfur Hives  . Adhesive [Tape] Hives and Rash  . Albuterol Palpitations  . Hydroxyzine Anxiety and Itching  . Prednisone Nausea And Vomiting, Anxiety and Other (See Comments)    Crazy mood swings, strange thoughts (pt does tolerate cortisone injections)    Medications Prior to Admission  Medication Sig Dispense Refill  . amLODipine (NORVASC) 5 MG tablet Take 1 tablet (5 mg total) by mouth 2 (two) times daily. 180 tablet 1  . ANORO ELLIPTA 62.5-25 MCG/INH AEPB Inhale 1 puff into the lungs daily. 14 each 0  . calcium carbonate (OS-CAL) 600 MG TABS tablet Take 1 tablet (600 mg total) by mouth 2 (  two) times daily with a meal. (Patient taking differently: Take 600 mg 3 (three) times daily by mouth. ) 60 tablet 0  . cholecalciferol (VITAMIN D) 1000 units tablet Take 4,000 Units by mouth daily.    . citalopram (CELEXA) 20 MG tablet Take 20 mg by mouth daily.  0  . conjugated estrogens (PREMARIN) vaginal cream Place vaginally 3 (three) times a week. Use 1/2 gram 3-4 times weekly. 42.5 g 4  . cycloSPORINE (RESTASIS) 0.05 % ophthalmic emulsion Place 1 drop into both eyes 2 (two) times daily. 60 each 5  . diclofenac sodium (VOLTAREN) 1 % GEL Apply 2 g topically 4 (four) times daily.    . ferrous sulfate (FERROUSUL) 325 (65 FE) MG tablet Take 1 tablet (325 mg  total) by mouth daily with breakfast. 90 tablet 1  . gabapentin (NEURONTIN) 100 MG capsule Take 1-2 capsules (100-200 mg total) by mouth 3 (three) times daily. (Patient taking differently: Take 100 mg by mouth 2 (two) times daily. ) 540 capsule 1  . guaiFENesin (MUCINEX) 600 MG 12 hr tablet Take 1 tablet (600 mg total) by mouth 2 (two) times daily as needed for cough. (Patient taking differently: Take 600 mg by mouth daily. ) 30 tablet 0  . hydrocortisone (PROCTOZONE-HC) 2.5 % rectal cream Place 1 application rectally 2 (two) times daily. 30 g 1  . hydrOXYzine (ATARAX/VISTARIL) 10 MG tablet Take 1 tablet by mouth 2 (two) times daily as needed for anxiety.     Marland Kitchen KRILL OIL OMEGA-3 PO Take 350 mg by mouth daily.    Marland Kitchen levalbuterol (XOPENEX) 1.25 MG/3ML nebulizer solution Inhale 3 mLs into the lungs daily.    Marland Kitchen levothyroxine (SYNTHROID, LEVOTHROID) 100 MCG tablet Take 1 tablet (100 mcg total) by mouth daily before breakfast. 90 tablet 1  . linaclotide (LINZESS) 290 MCG CAPS capsule Take 1 capsule (290 mcg total) by mouth daily before breakfast. 90 capsule 1  . magnesium oxide (MAG-OX) 400 MG tablet Take 400 mg by mouth daily with breakfast.    . montelukast (SINGULAIR) 10 MG tablet Take 1 tablet by mouth at bedtime.  11  . omeprazole (PRILOSEC) 20 MG capsule Take 1 capsule by mouth daily.    Vladimir Faster Glycol-Propyl Glycol (SYSTANE OP) Place 1 drop into both eyes daily as needed (dry eyes).    . polyethylene glycol powder (GLYCOLAX/MIRALAX) powder Take 17-34 g by mouth daily. 1700 g 5  . Probiotic Product (PROBIOTIC PO) Take 1 tablet by mouth daily.    . promethazine (PHENERGAN) 25 MG tablet Take 0.5 tablets (12.5 mg total) by mouth every 6 (six) hours as needed for nausea or vomiting. 30 tablet 0  . Pyridoxine HCl (VITAMIN B-6) 500 MG tablet Take 1 tablet by mouth 2 (two) times daily.    . ranitidine (ZANTAC) 150 MG tablet Take 150 mg by mouth daily before supper.    Marland Kitchen tiZANidine (ZANAFLEX) 2 MG  tablet Take 0.5-1 tablets (1-2 mg total) by mouth 2 (two) times daily as needed for muscle spasms. 180 tablet 1  . tolterodine (DETROL LA) 2 MG 24 hr capsule Take 1 capsule (2 mg total) daily by mouth. 30 capsule 11  . traZODone (DESYREL) 50 MG tablet Take 50 mg by mouth at bedtime.      Results for orders placed or performed during the hospital encounter of 09/19/17 (from the past 48 hour(s))  Glucose, capillary     Status: None   Collection Time: 10/29/17  6:59 AM  Result Value  Ref Range   Glucose-Capillary 78 70 - 99 mg/dL   Comment 1 Notify RN    No results found.  Review of Systems  Constitutional: Negative.   HENT: Negative.   Eyes: Negative.   Respiratory: Negative.   Cardiovascular: Negative.   Gastrointestinal: Negative.   Musculoskeletal: Negative.   Skin: Negative.   Neurological: Negative.   Psychiatric/Behavioral: Negative.     Blood pressure (!) 144/83, pulse (!) 54, temperature 98.2 F (36.8 C), temperature source Oral, resp. rate 16, height 5\' 3"  (1.6 m), weight 53 kg, SpO2 98 %. Physical Exam  Nursing note and vitals reviewed. Constitutional: She appears well-developed and well-nourished.  HENT:  Head: Normocephalic and atraumatic.  Eyes: Pupils are equal, round, and reactive to light. Conjunctivae are normal.  Neck: Normal range of motion.  Cardiovascular: Regular rhythm and normal heart sounds.  Respiratory: Effort normal. No respiratory distress.  GI: Soft.  Musculoskeletal: Normal range of motion.  Neurological: She is alert.  Skin: Skin is warm and dry.  Psychiatric: She has a normal mood and affect. Her behavior is normal. Judgment and thought content normal.     Assessment/Plan Follow up [place], MD 10/29/2017, 10:39 AM

## 2017-10-29 NOTE — Plan of Care (Signed)
Able to verbalize positive feeling of self . The ability to make decisions is ongoing  patient continue to need  direction with this goal. Working on coping  skills . Social Workers assisting with  placement  needs  with resources available.Compliant  with medication received . Denies suicidal  ideations . Voice of no safety concerns .    Problem: Education: Goal: Ability to make informed decisions regarding treatment will improve Outcome: Progressing   Problem: Coping: Goal: Coping ability will improve Outcome: Progressing   Problem: Health Behavior/Discharge Planning: Goal: Identification of resources available to assist in meeting health care needs will improve Outcome: Progressing   Problem: Medication: Goal: Compliance with prescribed medication regimen will improve Outcome: Progressing   Problem: Self-Concept: Goal: Ability to disclose and discuss suicidal ideas will improve Outcome: Progressing Goal: Will verbalize positive feelings about self Outcome: Progressing   Problem: Safety: Goal: Ability to remain free from injury will improve Outcome: Progressing   Problem: Spiritual Needs Goal: Ability to function at adequate level Outcome: Progressing

## 2017-10-29 NOTE — Transfer of Care (Signed)
Immediate Anesthesia Transfer of Care Note  Patient: Angela [name]  Procedure(s) Performed: ECT TX  Patient Location: PACU  Anesthesia Type:General  Level of Consciousness: sedated  Airway & Oxyg en Therapy: O2 via simple mask with spontaneous repirations  Post-op Assessment: Report given to RN and Post -op Vital signs reviewed and stable  Post vital signs: Reviewed and stable  Last Vitals:  Vitals Value Taken Time  BP 157/83 10/29/2017 11:03 AM  Temp 36.4 C 10/29/2017 11:03 AM  Pulse 74 10/29/2017 11:03 AM  Resp 16 10/29/2017 11:03 AM  SpO2 100 % 10/29/2017 11:03 AM  Vitals shown include unvalidated device data.  Last Pain:  Vitals:   10/29/17 1103  TempSrc: Temporal  PainSc:       Patients Stated Pain Goal: 4 (30/16/01 0932)  Complications: No apparent anesthesia complications

## 2017-10-29 NOTE — Tx Team (Signed)
Interdisciplinary Treatment and Diagnostic Plan Update  10/15/2017 Time of Session: 10:30am Angela [name] MRN: [id number]  Principal Diagnosis: Bipolar I disorder, most recent episode mixed, severe with psychotic features ([place])  Secondary Diagnoses: Principal Problem:   Bipolar I disorder, most recent episode mixed, severe with psychotic features ([place]) Active Problems:   Hypothyroidism   Essential hypertension   Plantar fasciitis, bilateral   PTSD (post-traumatic stress disorder)   Agitation   Current Medications:  Current Facility-Administered Medications  Medication Dose Route Frequency Provider Last Rate Last Dose  . acetaminophen (TYLENOL) tablet 650 mg  650 mg Oral Q6H PRN Clapacs, Madie Reno, MD   650 mg at 10/28/17 2356  . alum & mag hydroxide-simeth (MAALOX/MYLANTA) 200-200-20 MG/5ML suspension 30 mL  30 mL Oral Q4H PRN Clapacs, John T, MD   30 mL at 10/08/17 1507  . amLODipine (NORVASC) tablet 5 mg  5 mg Oral BID Clapacs, Madie Reno, MD   5 mg at 10/28/17 1721  . calcium carbonate (TUMS - dosed in mg elemental calcium) chewable tablet 400 mg of elemental calcium  2 tablet Oral Q breakfast Pucilowska, Jolanta B, MD   400 mg of elemental calcium at 10/28/17 0904  . cholecalciferol (VITAMIN D) tablet 1,000 Units  1,000 Units Oral Daily Pucilowska, Jolanta B, MD   1,000 Units at 10/28/17 0903  . cycloSPORINE (RESTASIS) 0.05 % ophthalmic emulsion 1 drop  1 drop Both Eyes BID Clapacs, Madie Reno, MD   1 drop at 10/29/17 0948  . diclofenac sodium (VOLTAREN) 1 % transdermal gel 4 g  4 g Topical QID Pucilowska, Jolanta B, MD   4 g at 10/28/17 2223  . feeding supplement (BOOST / RESOURCE BREEZE) liquid 1 Container  1 Container Oral TID BM Clapacs, Madie Reno, MD   1 Container at 10/28/17 1955  . fentaNYL (SUBLIMAZE) injection 25 mcg  25 mcg Intravenous Q5 min PRN Alvin Critchley, MD      . ferrous sulfate tablet 325 mg  325 mg Oral Q breakfast Pucilowska, Jolanta B, MD   325 mg at 10/28/17 0903  .  gabapentin (NEURONTIN) capsule 100 mg  100 mg Oral BID Clapacs, Madie Reno, MD   100 mg at 10/28/17 1720  . glycopyrrolate (ROBINUL) injection 0.1 mg  0.1 mg Intravenous Once Clapacs, John T, MD      . hydrocortisone cream 1 %   Topical BID Clapacs, John T, MD      . ipratropium (ATROVENT) 0.03 % nasal spray 2 spray  2 spray Each Nare TID He, Jun, MD   2 spray at 10/28/17 1722  . ketorolac (TORADOL) 30 MG/ML injection           . levalbuterol (XOPENEX) nebulizer solution 0.63 mg  0.63 mg Nebulization Q1400 Clapacs, Madie Reno, MD   0.63 mg at 10/28/17 1759  . levothyroxine (SYNTHROID, LEVOTHROID) tablet 100 mcg  100 mcg Oral QAC breakfast Pucilowska, Jolanta B, MD   Stopped at 10/29/17 0646  . lidocaine (LIDODERM) 5 % 1 patch  1 patch Transdermal Q24H Pucilowska, Jolanta B, MD   1 patch at 10/26/17 1800  . linaclotide (LINZESS) capsule 290 mcg  290 mcg Oral QAC breakfast Pucilowska, Jolanta B, MD   Stopped at 10/29/17 0646  . lip balm (BLISTEX) ointment   Topical PRN Clapacs, John T, MD      . loperamide (IMODIUM) capsule 2 mg  2 mg Oral PRN Pucilowska, Jolanta B, MD   2 mg at 10/18/17 0002  . LORazepam (  ATIVAN) tablet 0.5 mg  0.5 mg Oral Q6H PRN Clapacs, Madie Reno, MD   0.5 mg at 10/20/17 2134  . magnesium hydroxide (MILK OF MAGNESIA) suspension 30 mL  30 mL Oral Daily PRN Clapacs, Madie Reno, MD   30 mL at 10/05/17 1731  . magnesium oxide (MAG-OX) tablet 200 mg  200 mg Oral BID Pucilowska, Jolanta B, MD   200 mg at 10/28/17 1717  . midazolam (VERSED) injection 2 mg  2 mg Intravenous Once Clapacs, John T, MD      . multivitamin with minerals tablet 1 tablet  1 tablet Oral Daily Pucilowska, Jolanta B, MD   1 tablet at 10/28/17 0904  . omega-3 acid ethyl esters (LOVAZA) capsule 1 g  1 g Oral BID Pucilowska, Jolanta B, MD   1 g at 10/28/17 1718  . ondansetron (ZOFRAN) injection 4 mg  4 mg Intravenous Once Clapacs, John T, MD      . ondansetron [facility]) injection 4 mg  4 mg Intravenous Once PRN Gunnar Fusi,  MD      . ondansetron [facility]) injection 4 mg  4 mg Intravenous Once PRN Gunnar Bulla, MD      . ondansetron [facility]) injection 4 mg  4 mg Intravenous Once PRN Alvin Critchley, MD      . ondansetron [facility]) injection 4 mg  4 mg Intravenous Once Clapacs, John T, MD      . ondansetron [facility]) injection 4 mg  4 mg Intravenous Once PRN Alvin Critchley, MD      . ondansetron (ZOFRAN-ODT) disintegrating tablet 4 mg  4 mg Oral Q8H PRN Pucilowska, Jolanta B, MD   4 mg at 10/08/17 1807  . PARoxetine (PAXIL-CR) 24 hr tablet 25 mg  25 mg Oral Daily Clapacs, Madie Reno, MD   25 mg at 10/28/17 0907  . polyethylene glycol (MIRALAX / GLYCOLAX) packet 17 g  17 g Oral Daily Pucilowska, Jolanta B, MD   17 g at 10/26/17 1502  . polyvinyl alcohol (LIQUIFILM TEARS) 1.4 % ophthalmic solution 1 drop  1 drop Both Eyes PRN Ramond Dial, MD   1 drop at 10/25/17 1840  . prazosin (MINIPRESS) capsule 2 mg  2 mg Oral QHS Clapacs, Madie Reno, MD   2 mg at 10/28/17 2223  . promethazine (PHENERGAN) tablet 12.5 mg  12.5 mg Oral Q6H PRN Clapacs, John T, MD   12.5 mg at 10/24/17 1234  . pyridOXINE (VITAMIN B-6) tablet 100 mg  100 mg Oral BID Clapacs, Madie Reno, MD   100 mg at 10/28/17 1719  . tiZANidine (ZANAFLEX) tablet 2 mg  2 mg Oral Q8H PRN Pucilowska, Jolanta B, MD   2 mg at 10/21/17 1600  . traZODone (DESYREL) tablet 100 mg  100 mg Oral QHS Clapacs, Madie Reno, MD   100 mg at 10/28/17 2223  . triamcinolone cream (KENALOG) 0.1 %   Topical TID PRN Ramond Dial, MD   1 application at 50/27/74 2106  . vitamin C (ASCORBIC ACID) tablet 1,000 mg  1,000 mg Oral Daily Pucilowska, Jolanta B, MD   1,000 mg at 10/28/17 0909   PTA Medications: Medications Prior to Admission  Medication Sig Dispense Refill Last Dose  . amLODipine (NORVASC) 5 MG tablet Take 1 tablet (5 mg total) by mouth 2 (two) times daily. 180 tablet 1 10/28/2017 at Unknown time  . ANORO ELLIPTA 62.5-25 MCG/INH AEPB Inhale 1 puff into the lungs daily. 14 each 0 10/28/2017  at Unknown time  . calcium carbonate (  OS-CAL) 600 MG TABS tablet Take 1 tablet (600 mg total) by mouth 2 (two) times daily with a meal. (Patient taking differently: Take 600 mg 3 (three) times daily by mouth. ) 60 tablet 0 10/28/2017 at Unknown time  . cholecalciferol (VITAMIN D) 1000 units tablet Take 4,000 Units by mouth daily.   10/28/2017 at Unknown time  . citalopram (CELEXA) 20 MG tablet Take 20 mg by mouth daily.  0 10/28/2017 at Unknown time  . conjugated estrogens (PREMARIN) vaginal cream Place vaginally 3 (three) times a week. Use 1/2 gram 3-4 times weekly. 42.5 g 4 10/28/2017 at Unknown time  . cycloSPORINE (RESTASIS) 0.05 % ophthalmic emulsion Place 1 drop into both eyes 2 (two) times daily. 60 each 5 10/28/2017 at Unknown time  . diclofenac sodium (VOLTAREN) 1 % GEL Apply 2 g topically 4 (four) times daily.   10/28/2017 at Unknown time  . ferrous sulfate (FERROUSUL) 325 (65 FE) MG tablet Take 1 tablet (325 mg total) by mouth daily with breakfast. 90 tablet 1 10/28/2017 at Unknown time  . gabapentin (NEURONTIN) 100 MG capsule Take 1-2 capsules (100-200 mg total) by mouth 3 (three) times daily. (Patient taking differently: Take 100 mg by mouth 2 (two) times daily. ) 540 capsule 1 10/28/2017 at Unknown time  . guaiFENesin (MUCINEX) 600 MG 12 hr tablet Take 1 tablet (600 mg total) by mouth 2 (two) times daily as needed for cough. (Patient taking differently: Take 600 mg by mouth daily. ) 30 tablet 0 10/28/2017 at Unknown time  . hydrocortisone (PROCTOZONE-HC) 2.5 % rectal cream Place 1 application rectally 2 (two) times daily. 30 g 1 10/28/2017 at Unknown time  . hydrOXYzine (ATARAX/VISTARIL) 10 MG tablet Take 1 tablet by mouth 2 (two) times daily as needed for anxiety.    10/28/2017 at Unknown time  . KRILL OIL OMEGA-3 PO Take 350 mg by mouth daily.   10/28/2017 at Unknown time  . levalbuterol (XOPENEX) 1.25 MG/3ML nebulizer solution Inhale 3 mLs into the lungs daily.   10/28/2017 at Unknown  time  . levothyroxine (SYNTHROID, LEVOTHROID) 100 MCG tablet Take 1 tablet (100 mcg total) by mouth daily before breakfast. 90 tablet 1 10/28/2017 at Unknown time  . linaclotide (LINZESS) 290 MCG CAPS capsule Take 1 capsule (290 mcg total) by mouth daily before breakfast. 90 capsule 1 10/28/2017 at Unknown time  . magnesium oxide (MAG-OX) 400 MG tablet Take 400 mg by mouth daily with breakfast.   10/28/2017 at Unknown time  . montelukast (SINGULAIR) 10 MG tablet Take 1 tablet by mouth at bedtime.  11 10/28/2017 at Unknown time  . omeprazole (PRILOSEC) 20 MG capsule Take 1 capsule by mouth daily.   10/28/2017 at Unknown time  . Polyethyl Glycol-Propyl Glycol (SYSTANE OP) Place 1 drop into both eyes daily as needed (dry eyes).   10/28/2017 at Unknown time  . polyethylene glycol powder (GLYCOLAX/MIRALAX) powder Take 17-34 g by mouth daily. 1700 g 5 10/28/2017 at Unknown time  . Probiotic Product (PROBIOTIC PO) Take 1 tablet by mouth daily.   10/28/2017 at Unknown time  . promethazine (PHENERGAN) 25 MG tablet Take 0.5 tablets (12.5 mg total) by mouth every 6 (six) hours as needed for nausea or vomiting. 30 tablet 0 10/28/2017 at Unknown time  . Pyridoxine HCl (VITAMIN B-6) 500 MG tablet Take 1 tablet by mouth 2 (two) times daily.   10/28/2017 at Unknown time  . ranitidine (ZANTAC) 150 MG tablet Take 150 mg by mouth daily before supper.  10/28/2017 at Unknown time  . tiZANidine (ZANAFLEX) 2 MG tablet Take 0.5-1 tablets (1-2 mg total) by mouth 2 (two) times daily as needed for muscle spasms. 180 tablet 1 10/28/2017 at Unknown time  . tolterodine (DETROL LA) 2 MG 24 hr capsule Take 1 capsule (2 mg total) daily by mouth. 30 capsule 11 10/28/2017 at Unknown time  . traZODone (DESYREL) 50 MG tablet Take 50 mg by mouth at bedtime.   10/28/2017 at Unknown time    Patient Stressors: Financial difficulties Health problems Traumatic event Other: "Pain" "fear" "trauma" "homeless"  Patient Strengths: Ability  for insight Communication skills General fund of knowledge Motivation for treatment/growth  Treatment Modalities: Medication Management, Group therapy, Case management,  1 to 1 session with clinician, Psychoeducation, Recreational therapy.   Physician Treatment Plan for Primary Diagnosis: Bipolar I disorder, most recent episode mixed, severe with psychotic features ([place]) Long Term Goal(s): Improvement in symptoms so as ready for discharge Improvement in symptoms so as ready for discharge   Short Term Goals: Ability to identify changes in lifestyle to reduce recurrence of condition will improve Ability to verbalize feelings will improve Ability to disclose and discuss suicidal ideas Ability to demonstrate self-control will improve Ability to identify and develop effective coping behaviors will improve Ability to maintain clinical measurements within normal limits will improve Compliance with prescribed medications will improve Ability to identify triggers associated with substance abuse/mental health issues will improve NA  Medication Management: Evaluate patient's response, side effects, and tolerance of medication regimen.  Therapeutic Interventions: 1 to 1 sessions, Unit Group sessions and Medication administration.  Evaluation of Outcomes: Progressing  Physician Treatment Plan for Secondary Diagnosis: Principal Problem:   Bipolar I disorder, most recent episode mixed, severe with psychotic features ([place]) Active Problems:   Hypothyroidism   Essential hypertension   Plantar fasciitis, bilateral   PTSD (post-traumatic stress disorder)   Agitation  Long Term Goal(s): Improvement in symptoms so as ready for discharge Improvement in symptoms so as ready for discharge   Short Term Goals: Ability to identify changes in lifestyle to reduce recurrence of condition will improve Ability to verbalize feelings will improve Ability to disclose and discuss suicidal ideas Ability to  demonstrate self-control will improve Ability to identify and develop effective coping behaviors will improve Ability to maintain clinical measurements within normal limits will improve Compliance with prescribed medications will improve Ability to identify triggers associated with substance abuse/mental health issues will improve NA     Medication Management: Evaluate patient's response, side effects, and tolerance of medication regimen.  Therapeutic Interventions: 1 to 1 sessions, Unit Group sessions and Medication administration.  Evaluation of Outcomes: Progressing   RN Treatment Plan for Primary Diagnosis: Bipolar I disorder, most recent episode mixed, severe with psychotic features ([place]) Long Term Goal(s): Knowledge of disease and therapeutic regimen to maintain health will improve  Short Term Goals: Ability to identify and develop effective coping behaviors will improve and Compliance with prescribed medications will improve  Medication Management: RN will administer medications as ordered by provider, will assess and evaluate patient's response and provide education to patient for prescribed medication. RN will report any adverse and/or side effects to prescribing provider.  Therapeutic Interventions: 1 on 1 counseling sessions, Psychoeducation, Medication administration, Evaluate responses to treatment, Monitor vital signs and CBGs as ordered, Perform/monitor CIWA, COWS, AIMS and Fall Risk screenings as ordered, Perform wound care treatments as ordered.  Evaluation of Outcomes: Progressing   LCSW Treatment Plan for Primary Diagnosis: Bipolar I disorder,  most recent episode mixed, severe with psychotic features [facility]) Long Term Goal(s): Safe transition to appropriate next level of care at discharge, Engage patient in therapeutic group addressing interpersonal concerns.  Short Term Goals: Engage patient in aftercare planning with referrals and resources, Increase social support and  Increase skills for wellness and recovery  Therapeutic Interventions: Assess for all discharge needs, 1 to 1 time with Social worker, Explore available resources and support systems, Assess for adequacy in community support network, Educate family and significant other(s) on suicide prevention, Complete Psychosocial Assessment, Interpersonal group therapy.  Evaluation of Outcomes: Progressing   Progress in Treatment: Attending groups: No. Participating in groups: No. Taking medication as prescribed: Yes. Toleration medication: Yes. Family/Significant other contact made: No, will contact:  pt declined consent Patient understands diagnosis: Yes. Discussing patient identified problems/goals with staff: Yes. Medical problems stabilized or resolved: Yes. Denies suicidal/homicidal ideation: Yes. Issues/concerns per patient self-inventory: No. Other: none  New problem(s) identified: No, Describe:  none  New Short Term/Long Term Goal(s):  Patient Goals: Currently receiving ECT treatment  Discharge Plan or Barriers: TBD.  Pt is making some progress with ECT, but still quite irritable and demanding. Barrier to discharge has been finding pt an appropriate place to discharge to.  CSW has contacted several mental health facilities/grouphomes, but there have not been any open beds.  Several low-income apartments have also been contacted with long waitlists.  Other placement/residential options will be explored.  Reason for Continuation of Hospitalization: Depression, irritability, disorganization, refusing medications at times. Pt needs to continue ECT.  On-going efforts to secure appropriate d/c placement.  Estimated Length of Stay: 5-7 days.  Attendees: Patient:   Physician: Alethia Berthold, MD  10/24/2017  Nursing:   RN Care Manager:   Social Worker:Pearline Yerby Jacki Cones, Latanya Presser 10/24/2017  Recreational Therapist: 10/24/2017  Other:  10/24/2017  Other:    Other:     Scribe for Treatment  Team: Darin Engels, LCSW 10/29/2017 12:05 PM

## 2017-10-29 NOTE — Anesthesia Postprocedure Evaluation (Signed)
Anesthesia Post Note  Patient: Angela [name]  Procedure(s) Performed: ECT TX  Patient location during evaluation: PACU Anesthesia Type: General Level of consciousness: awake and alert Pain management: pain level controlled Vital Signs Assessment: post-procedure vital signs reviewed and stable Respiratory status: spontaneous breathing, nonlabored ventilation and respiratory function stable Cardiovascular status: blood pressure returned to baseline and stable Postop Assessment: no signs of nausea or vomiting Anesthetic complications: no     Last Vitals:  Vitals:   10/29/17 1140 10/29/17 1145  BP:  (!) 157/80  Pulse: 70   Resp: 16   Temp:    SpO2: 100%     Last Pain:  Vitals:   10/29/17 1135  TempSrc:   PainSc: 0-No pain                 Chayil Gantt

## 2017-10-29 NOTE — Progress Notes (Signed)
D:Patient escorted to and from ECT without  Incidence  On return to unit  Patient went to bed did not want lunch .  Patient   Had a phone call  To make at [facility] for placement. Patient did not get up for  Nebulizer treatment . Respiratory  Came down twice  For this treatment . Patient  Remained in bed  Able to verbalize positive feeling of self .The ability to make decisions is ongoing patient continue to need direction with this goal. Working on coping skills . Social Workers assisting with placement needs with resources available.Compliant with medication received . Denies suicidal ideations . Voice of no safety concerns . A: Encourage patient participation with unit programming . Instruction  Given on  Medication , verbalize understanding. R: Voice no other concerns. Staff continue to monitor

## 2017-10-29 NOTE — [facility] Group Notes (Signed)
  LCSW Group Therapy Note Monday October 14/2019  Type of Therapy and Topic:  Group Therapy:  Overcoming Obstacles   Participation Level:  DID NOT ATTEND   Description of Group:   In this group patients will be encouraged to explore what they see as obstacles to their own wellness and recovery. They will be guided to discuss their thoughts, feelings, and behaviors related to these obstacles. The group will process together ways to cope with barriers, with attention given to specific choices patients can make. Each patient will be challenged to identify changes they are motivated to make in order to overcome their obstacles. This group will be process-oriented, with patients participating in exploration of their own experiences, giving and receiving support, and processing challenge from other group members.

## 2017-10-29 NOTE — Anesthesia Post-op Follow-up Note (Signed)
Anesthesia QCDR form completed.        

## 2017-10-29 NOTE — Progress Notes (Signed)
OT Cancellation Note  Patient Details Name: Angela [name] MRN: [id number] DOB: 1950-07-17   Cancelled Treatment:    Reason Eval/Treat Not Completed: Other (comment). Pt preparing for ECT treatment today, unavailable for OT. Will re-attempt OT treatment at later date/time as pt is available and medically appropriate.   Jeni Salles, MPH, MS, OTR/L ascom [phone] 10/29/17, 10:49 AM

## 2017-10-30 NOTE — NC FL2 (Signed)
[place] LEVEL OF CARE SCREENING TOOL     IDENTIFICATION  Patient Name: Angela [name] Birthdate: [birth year] Sex: female Admission Date (Current Location): 09/19/2017  [place] and [place] Number:  [name] [id number] [place] and Address:  [facility], [address], [place], [place] [address]      Provider Number: [id number]  Attending Physician Name and Address:  Gonzella Lex, MD  Relative Name and Phone Number:       Current Level of Care: Hospital Recommended Level of Care: [place] Prior Approval Number:    Date Approved/Denied:   PASRR Number:    Discharge Plan: Domiciliary (Rest home)    Current Diagnoses: Patient Active Problem List   Diagnosis Date Noted  . Bipolar I disorder, most recent episode mixed, severe with psychotic features ([place]) 09/18/2017  . Agitation 09/18/2017  . IBS (irritable bowel syndrome) 11/17/2016  . Bilateral carpal tunnel syndrome 11/17/2016  . Plantar fasciitis, bilateral 11/17/2016  . Chronic pain syndrome 11/17/2016  . Chronic hyponatremia 11/17/2016  . PTSD (post-traumatic stress disorder) 11/17/2016  . Hernia, hiatal 11/17/2016  . Vitamin D deficiency 11/17/2016  . Vitamin B12 deficiency 11/17/2016  . SIADH (syndrome of inappropriate ADH production) ([place]) 09/08/2016  . Cervical spondylosis with myelopathy 05/24/2016  . Post-concussion headache 05/24/2016  . MCI (mild cognitive impairment) with memory loss 03/30/2016  . Gait abnormality 03/30/2016  . Chronic bipolar affective disorder ([place]) 01/24/2016  . Anemia, iron deficiency 06/24/2015  . Endometriosis 06/24/2015  . Essential hypertension 06/24/2015  . History of postmenopausal bleeding 05/22/2015  . History of TIA (transient ischemic attack) 04/20/2015  . Hypothyroidism 04/20/2015  . GERD (gastroesophageal reflux disease) 04/20/2015  . Cervical disc disorder with radiculopathy 03/25/2015  . Chronic neck pain  03/25/2015  . Pelvic pain in female 10/30/2014  . History of skin cancer 06/30/2014  . Chronic headaches 12/16/2013  . Neuropathy 12/16/2013    Orientation RESPIRATION BLADDER Height & Weight     Self, Time, Situation, Place  Normal Continent Weight: 116 lb 13.5 oz (53 kg) Height:  5\' 3"  (160 cm)  BEHAVIORAL SYMPTOMS/MOOD NEUROLOGICAL BOWEL NUTRITION STATUS  (None) (None ) Continent (Regular)  AMBULATORY STATUS COMMUNICATION OF NEEDS Skin   Independent Verbally Normal                       Personal Care Assistance Level of Assistance  Feeding, Dressing, Bathing, Total care Bathing Assistance: Independent Feeding assistance: Independent Dressing Assistance: Independent Total Care Assistance: Independent   Functional Limitations Info  Sight, Hearing, Speech Sight Info: Adequate Hearing Info: Adequate Speech Info: Adequate    SPECIAL CARE FACTORS FREQUENCY  Blood pressure                    Contractures Contractures Info: Not present    Additional Factors Info  Psychotropic, Allergies   Allergies Info:   Allergies  Bee Venom  Hives High Allergy 01/20/2015   Darvon [Propoxyphene]  Anaphylaxis, Hives High Allergy 08/07/2012   Hives/throat swelling    Dicyclomine  Itching High Allergy 07/19/2015   Other  Hives High Allergy 08/07/2012   Spider bites cause hives    Oxycodone-acetaminophen  Nausea And Vomiting, Other (See Comments) High  08/07/2012   hallucinations    Peanuts [Peanut Oil]  Anaphylaxis, Hives High Allergy 08/07/2012   Penicillins  Hives High Allergy 08/07/2012   Has patient had a PCN reaction causing immediate rash, facial/tongue/throat swelling, SOB or lightheadedness with  hypotension: Unknown Has patient had a PCN reaction causing severe rash involving mucus membranes or skin necrosis: Unknown Has patient had a PCN reaction that required hospitalization: Unknown Has patient had a PCN reaction occurring within the last 10 years: Unknown If all of  the above answers are "NO", then may proceed with Cephalosporin use.    Sulfa Antibiotics  Anaphylaxis, Hives, Itching High Allergy 08/07/2012   Sulfacetamide Sodium  Anaphylaxis, Hives, Itching High Allergy 10/27/2014   Ultram [Tramadol]  Hives High Allergy 01/15/2013   Amikacin  Nausea And Vomiting Not Specified Allergy 01/15/2013   Aspirin  Hives Not Specified Allergy 01/15/2013   Doxycycline  Photosensitivity Not Specified  12/02/2014   Haloperidol  Nausea And Vomiting Not Specified  05/27/2013   Hymenoptera Venom Preparations  Hives Not Specified Allergy 03/26/2015   Reaction to spider bites    Imipramine  Hives Not Specified Allergy 03/26/2015   Reaction to Tofranil    Keflex [Cephalexin]  Hives Not Specified Allergy 08/29/2016   Lactose Intolerance (gi)   Not Specified  10/05/2017   Sulfur  Hives Not Specified Allergy 07/31/2013   Adhesive [Tape]  Hives, Rash Low Allergy 05/27/2013   Albuterol  Palpitations Low Allergy 10/22/2017   Hydroxyzine  Anxiety, Itching Low  06/30/2014   Peanut Butter Flavor  Anxiety Low  10/29/2017   Prednisone  Nausea And Vomiting, Anxiety, Other (See Comments) Low  01/15/2013   Crazy mood swings, strange thoughts (pt does tolerate cortisone injections)      Psychotropic Info: Medication Psycotropic Medications         Current Medications (10/30/2017):  This is the current hospital active medication list Current Facility-Administered Medications  Medication Dose Route Frequency Provider Last Rate Last Dose  . acetaminophen (TYLENOL) tablet 650 mg  650 mg Oral Q6H PRN Clapacs, Madie Reno, MD   650 mg at 10/28/17 2356  . alum & mag hydroxide-simeth (MAALOX/MYLANTA) 200-200-20 MG/5ML suspension 30 mL  30 mL Oral Q4H PRN Clapacs, John T, MD   30 mL at 10/08/17 1507  . amLODipine (NORVASC) tablet 5 mg  5 mg Oral BID Clapacs, Madie Reno, MD   5 mg at 10/30/17 0912  . calcium carbonate (TUMS - dosed in mg elemental calcium) chewable tablet 400 mg of elemental calcium  2  tablet Oral Q breakfast Pucilowska, Jolanta B, MD   400 mg of elemental calcium at 10/28/17 0904  . cholecalciferol (VITAMIN D) tablet 1,000 Units  1,000 Units Oral Daily Pucilowska, Jolanta B, MD   1,000 Units at 10/30/17 0911  . cycloSPORINE (RESTASIS) 0.05 % ophthalmic emulsion 1 drop  1 drop Both Eyes BID Clapacs, Madie Reno, MD   1 drop at 10/30/17 0915  . diclofenac sodium (VOLTAREN) 1 % transdermal gel 4 g  4 g Topical QID Pucilowska, Jolanta B, MD   4 g at 10/30/17 0923  . feeding supplement (BOOST / RESOURCE BREEZE) liquid 1 Container  1 Container Oral TID BM Clapacs, John T, MD   237 mL at 10/29/17 2025  . fentaNYL (SUBLIMAZE) injection 25 mcg  25 mcg Intravenous Q5 min PRN Alvin Critchley, MD      . ferrous sulfate tablet 325 mg  325 mg Oral Q breakfast Pucilowska, Jolanta B, MD   325 mg at 10/30/17 0911  . gabapentin (NEURONTIN) capsule 100 mg  100 mg Oral BID Clapacs, Madie Reno, MD   100 mg at 10/30/17 0911  . glycopyrrolate (ROBINUL) injection 0.1 mg  0.1 mg Intravenous Once Clapacs, John  T, MD      . hydrocortisone cream 1 %   Topical BID Clapacs, John T, MD      . ipratropium (ATROVENT) 0.03 % nasal spray 2 spray  2 spray Each Nare TID He, Jun, MD   2 spray at 10/30/17 0917  . levalbuterol (XOPENEX) nebulizer solution 0.63 mg  0.63 mg Nebulization Q1400 Clapacs, Madie Reno, MD   0.63 mg at 10/30/17 1512  . levothyroxine (SYNTHROID, LEVOTHROID) tablet 100 mcg  100 mcg Oral QAC breakfast Pucilowska, Jolanta B, MD   100 mcg at 10/30/17 0624  . lidocaine (LIDODERM) 5 % 1 patch  1 patch Transdermal Q24H Pucilowska, Jolanta B, MD   1 patch at 10/30/17 0926  . linaclotide (LINZESS) capsule 290 mcg  290 mcg Oral QAC breakfast Pucilowska, Jolanta B, MD   290 mcg at 10/30/17 0624  . lip balm (BLISTEX) ointment   Topical PRN Clapacs, John T, MD      . loperamide (IMODIUM) capsule 2 mg  2 mg Oral PRN Pucilowska, Jolanta B, MD   2 mg at 10/18/17 0002  . LORazepam (ATIVAN) tablet 0.5 mg  0.5 mg Oral Q6H PRN  Clapacs, Madie Reno, MD   0.5 mg at 10/30/17 0033  . magnesium hydroxide (MILK OF MAGNESIA) suspension 30 mL  30 mL Oral Daily PRN Clapacs, Madie Reno, MD   30 mL at 10/05/17 1731  . magnesium oxide (MAG-OX) tablet 200 mg  200 mg Oral BID Pucilowska, Jolanta B, MD   200 mg at 10/30/17 0912  . midazolam (VERSED) injection 2 mg  2 mg Intravenous Once Clapacs, John T, MD      . multivitamin with minerals tablet 1 tablet  1 tablet Oral Daily Pucilowska, Jolanta B, MD   1 tablet at 10/30/17 0911  . omega-3 acid ethyl esters (LOVAZA) capsule 1 g  1 g Oral BID Pucilowska, Jolanta B, MD   1 g at 10/30/17 0912  . ondansetron (ZOFRAN) injection 4 mg  4 mg Intravenous Once Clapacs, John T, MD      . ondansetron [facility]) injection 4 mg  4 mg Intravenous Once PRN Gunnar Fusi, MD      . ondansetron [facility]) injection 4 mg  4 mg Intravenous Once PRN Gunnar Bulla, MD      . ondansetron [facility]) injection 4 mg  4 mg Intravenous Once PRN Alvin Critchley, MD      . ondansetron [facility]) injection 4 mg  4 mg Intravenous Once Clapacs, John T, MD      . ondansetron [facility]) injection 4 mg  4 mg Intravenous Once PRN Alvin Critchley, MD      . ondansetron (ZOFRAN-ODT) disintegrating tablet 4 mg  4 mg Oral Q8H PRN Pucilowska, Jolanta B, MD   4 mg at 10/08/17 1807  . PARoxetine (PAXIL-CR) 24 hr tablet 25 mg  25 mg Oral Daily Clapacs, Madie Reno, MD   25 mg at 10/30/17 0914  . polyethylene glycol (MIRALAX / GLYCOLAX) packet 17 g  17 g Oral Daily Pucilowska, Jolanta B, MD   17 g at 10/26/17 1502  . polyvinyl alcohol (LIQUIFILM TEARS) 1.4 % ophthalmic solution 1 drop  1 drop Both Eyes PRN Ramond Dial, MD   1 drop at 10/30/17 0626  . prazosin (MINIPRESS) capsule 2 mg  2 mg Oral QHS Clapacs, Madie Reno, MD   2 mg at 10/29/17 2221  . promethazine (PHENERGAN) tablet 12.5 mg  12.5 mg Oral Q6H PRN Clapacs, Madie Reno, MD  12.5 mg at 10/24/17 1234  . pyridOXINE (VITAMIN B-6) tablet 100 mg  100 mg Oral BID Clapacs, Madie Reno, MD   100 mg at  10/30/17 0913  . tiZANidine (ZANAFLEX) tablet 2 mg  2 mg Oral Q8H PRN Pucilowska, Jolanta B, MD   2 mg at 10/21/17 1600  . traZODone (DESYREL) tablet 100 mg  100 mg Oral QHS Clapacs, Madie Reno, MD   100 mg at 10/29/17 2221  . triamcinolone cream (KENALOG) 0.1 %   Topical TID PRN Ramond Dial, MD   1 application at 93/57/01 2106  . vitamin C (ASCORBIC ACID) tablet 1,000 mg  1,000 mg Oral Daily Pucilowska, Jolanta B, MD   1,000 mg at 10/30/17 7793     Discharge Medications: Please see discharge summary for a list of discharge medications.  Relevant Imaging Results:  Relevant Lab Results:   Additional Information SSN:  [id number]  Darin Engels, LCSW

## 2017-10-30 NOTE — Progress Notes (Signed)
[facility] MD Progress Note  10/30/2017 4:51 PM Angela [name]  MRN:  [id number] Subjective: Follow-up for patient with chronic mood instability.  Patient was agitated today confronting multiple people on the unit with new somatic complaints.  Specifically today she claimed that her breasts were hurting.  Patient does not appear to be in a great deal of distress vitals a bit stable no sign of acute illness.  Once I got to talking with her I was able to keep her on topic discussing appropriate discharge planning a little better than usual. Principal Problem: Bipolar I disorder, most recent episode mixed, severe with psychotic features [facility]) Diagnosis:   Patient Active Problem List   Diagnosis Date Noted  . Bipolar I disorder, most recent episode mixed, severe with psychotic features ([place]) [F31.64] 09/18/2017  . Agitation [R45.1] 09/18/2017  . IBS (irritable bowel syndrome) [K58.9] 11/17/2016  . Bilateral carpal tunnel syndrome [G56.03] 11/17/2016  . Plantar fasciitis, bilateral [M72.2] 11/17/2016  . Chronic pain syndrome [G89.4] 11/17/2016  . Chronic hyponatremia [E87.1] 11/17/2016  . PTSD (post-traumatic stress disorder) [F43.10] 11/17/2016  . Hernia, hiatal [K44.9] 11/17/2016  . Vitamin D deficiency [E55.9] 11/17/2016  . Vitamin B12 deficiency [E53.8] 11/17/2016  . SIADH (syndrome of inappropriate ADH production) ([place]) [E22.2] 09/08/2016  . Cervical spondylosis with myelopathy [M47.12] 05/24/2016  . Post-concussion headache [G44.309] 05/24/2016  . MCI (mild cognitive impairment) with memory loss [G31.84] 03/30/2016  . Gait abnormality [R26.9] 03/30/2016  . Chronic bipolar affective disorder ([place]) [F31.9] 01/24/2016  . Anemia, iron deficiency [D50.9] 06/24/2015  . Endometriosis [N80.9] 06/24/2015  . Essential hypertension [I10] 06/24/2015  . History of postmenopausal bleeding [Z87.42] 05/22/2015  . History of TIA (transient ischemic attack) [Z86.73] 04/20/2015  . Hypothyroidism [E03.9]  04/20/2015  . GERD (gastroesophageal reflux disease) [K21.9] 04/20/2015  . Cervical disc disorder with radiculopathy [M50.10] 03/25/2015  . Chronic neck pain [M54.2, G89.29] 03/25/2015  . Pelvic pain in female [R10.2] 10/30/2014  . History of skin cancer [Z85.828] 06/30/2014  . Chronic headaches [R51] 12/16/2013  . Neuropathy [G62.9] 12/16/2013   Total Time spent with patient: 20 minutes  Past Psychiatric History: Long history of chronic mental health problems with difficulty in maintaining placement  Past Medical History:  Past Medical History:  Diagnosis Date  . Allergic rhinitis   . Anemia   . Blurred vision   . Depression   . Diverticulosis   . Endometriosis   . Falls   . GERD (gastroesophageal reflux disease)   . Hernia, hiatal   . Hypothyroidism   . IBS (irritable bowel syndrome)   . Malignant neoplasm of skin   . Migraine   . Neuropathy   . PTSD (post-traumatic stress disorder)   . Thyroid disease     Past Surgical History:  Procedure Laterality Date  . APPENDECTOMY    . CHOLECYSTECTOMY    . CHOLECYSTECTOMY, LAPAROSCOPIC    . ESOPHAGOGASTRODUODENOSCOPY (EGD) WITH PROPOFOL N/A 03/29/2015   Procedure: ESOPHAGOGASTRODUODENOSCOPY (EGD) WITH PROPOFOL;  Surgeon: Josefine Class, MD;  Location: [facility] ENDOSCOPY;  Service: Endoscopy;  Laterality: N/A;  . HERNIA REPAIR    . laproscopy    . TONSILLECTOMY    . uteral suspension     Family History:  Family History  Problem Relation Age of Onset  . Heart disease Father   . Cancer Mother        lung  . Urolithiasis Neg Hx   . Kidney disease Neg Hx   . Kidney cancer Neg Hx   . Prostate  cancer Neg Hx    Family Psychiatric  History: See previous note Social History:  Social History   Substance and Sexual Activity  Alcohol Use No     Social History   Substance and Sexual Activity  Drug Use No    Social History   Socioeconomic History  . Marital status: Single    Spouse name: Not on file  . Number of  children: 0  . Years of education: 1.5 years of college  . Highest education level: Not on file  Occupational History  . Occupation: Retired  Scientific laboratory technician  . Financial resource strain: Not on file  . Food insecurity:    Worry: Not on file    Inability: Not on file  . Transportation needs:    Medical: Not on file    Non-medical: Not on file  Tobacco Use  . Smoking status: Former Smoker    Last attempt to quit: 11/17/1976    Years since quitting: 40.9  . Smokeless tobacco: Never Used  Substance and Sexual Activity  . Alcohol use: No  . Drug use: No  . Sexual activity: Not Currently  Lifestyle  . Physical activity:    Days per week: Not on file    Minutes per session: Not on file  . Stress: Not on file  Relationships  . Social connections:    Talks on phone: Not on file    Gets together: Not on file    Attends religious service: Not on file    Active member of club or organization: Not on file    Attends meetings of clubs or organizations: Not on file    Relationship status: Not on file  Other Topics Concern  . Not on file  Social History Narrative   Lives at home alone.   Right-handed.   No daily caffeine use.   Additional Social History:    Pain Medications: See PTA Prescriptions: See PTA Over the Counter: See PTA History of alcohol / drug use?: No history of alcohol / drug abuse Longest period of sobriety (when/how long): Reports of none Negative Consequences of Use: (n/a) Withdrawal Symptoms: (n/a)                    Sleep: Fair  Appetite:  Fair  Current Medications: Current Facility-Administered Medications  Medication Dose Route Frequency Provider Last Rate Last Dose  . acetaminophen (TYLENOL) tablet 650 mg  650 mg Oral Q6H PRN Ethelyn Cerniglia, Madie Reno, MD   650 mg at 10/28/17 2356  . alum & mag hydroxide-simeth (MAALOX/MYLANTA) 200-200-20 MG/5ML suspension 30 mL  30 mL Oral Q4H PRN Amariana Mirando T, MD   30 mL at 10/08/17 1507  . amLODipine (NORVASC)  tablet 5 mg  5 mg Oral BID Fia Hebert, Madie Reno, MD   5 mg at 10/30/17 0912  . calcium carbonate (TUMS - dosed in mg elemental calcium) chewable tablet 400 mg of elemental calcium  2 tablet Oral Q breakfast Pucilowska, Jolanta B, MD   400 mg of elemental calcium at 10/28/17 0904  . cholecalciferol (VITAMIN D) tablet 1,000 Units  1,000 Units Oral Daily Pucilowska, Jolanta B, MD   1,000 Units at 10/30/17 0911  . cycloSPORINE (RESTASIS) 0.05 % ophthalmic emulsion 1 drop  1 drop Both Eyes BID Maelyn Berrey, Madie Reno, MD   1 drop at 10/30/17 0915  . diclofenac sodium (VOLTAREN) 1 % transdermal gel 4 g  4 g Topical QID Pucilowska, Jolanta B, MD   4 g at 10/30/17  0923  . feeding supplement (BOOST / RESOURCE BREEZE) liquid 1 Container  1 Container Oral TID BM Sherry Blackard T, MD   237 mL at 10/29/17 2025  . fentaNYL (SUBLIMAZE) injection 25 mcg  25 mcg Intravenous Q5 min PRN Alvin Critchley, MD      . ferrous sulfate tablet 325 mg  325 mg Oral Q breakfast Pucilowska, Jolanta B, MD   325 mg at 10/30/17 0911  . gabapentin (NEURONTIN) capsule 100 mg  100 mg Oral BID Ayla Dunigan, Madie Reno, MD   100 mg at 10/30/17 0911  . glycopyrrolate (ROBINUL) injection 0.1 mg  0.1 mg Intravenous Once Tetsuo Coppola T, MD      . hydrocortisone cream 1 %   Topical BID Damoni Erker T, MD      . ipratropium (ATROVENT) 0.03 % nasal spray 2 spray  2 spray Each Nare TID He, Jun, MD   2 spray at 10/30/17 0917  . levalbuterol (XOPENEX) nebulizer solution 0.63 mg  0.63 mg Nebulization Q1400 Nuchem Grattan, Madie Reno, MD   0.63 mg at 10/30/17 1512  . levothyroxine (SYNTHROID, LEVOTHROID) tablet 100 mcg  100 mcg Oral QAC breakfast Pucilowska, Jolanta B, MD   100 mcg at 10/30/17 0624  . lidocaine (LIDODERM) 5 % 1 patch  1 patch Transdermal Q24H Pucilowska, Jolanta B, MD   1 patch at 10/30/17 0926  . linaclotide (LINZESS) capsule 290 mcg  290 mcg Oral QAC breakfast Pucilowska, Jolanta B, MD   290 mcg at 10/30/17 0624  . lip balm (BLISTEX) ointment   Topical PRN Chinaza Rooke,  Ken Bonn T, MD      . loperamide (IMODIUM) capsule 2 mg  2 mg Oral PRN Pucilowska, Jolanta B, MD   2 mg at 10/18/17 0002  . LORazepam (ATIVAN) tablet 0.5 mg  0.5 mg Oral Q6H PRN Azizi Bally, Madie Reno, MD   0.5 mg at 10/30/17 0033  . magnesium hydroxide (MILK OF MAGNESIA) suspension 30 mL  30 mL Oral Daily PRN Charlaine Utsey, Madie Reno, MD   30 mL at 10/05/17 1731  . magnesium oxide (MAG-OX) tablet 200 mg  200 mg Oral BID Pucilowska, Jolanta B, MD   200 mg at 10/30/17 0912  . midazolam (VERSED) injection 2 mg  2 mg Intravenous Once Adelei Scobey T, MD      . multivitamin with minerals tablet 1 tablet  1 tablet Oral Daily Pucilowska, Jolanta B, MD   1 tablet at 10/30/17 0911  . omega-3 acid ethyl esters (LOVAZA) capsule 1 g  1 g Oral BID Pucilowska, Jolanta B, MD   1 g at 10/30/17 0912  . ondansetron (ZOFRAN) injection 4 mg  4 mg Intravenous Once Rutherford Alarie T, MD      . ondansetron [facility]) injection 4 mg  4 mg Intravenous Once PRN Gunnar Fusi, MD      . ondansetron [facility]) injection 4 mg  4 mg Intravenous Once PRN Gunnar Bulla, MD      . ondansetron [facility]) injection 4 mg  4 mg Intravenous Once PRN Alvin Critchley, MD      . ondansetron [facility]) injection 4 mg  4 mg Intravenous Once Shariq Puig T, MD      . ondansetron [facility]) injection 4 mg  4 mg Intravenous Once PRN Alvin Critchley, MD      . ondansetron (ZOFRAN-ODT) disintegrating tablet 4 mg  4 mg Oral Q8H PRN Pucilowska, Jolanta B, MD   4 mg at 10/08/17 1807  . PARoxetine (PAXIL-CR) 24 hr tablet 25 mg  25 mg Oral Daily Sorren Vallier, Madie Reno, MD   25 mg at 10/30/17 0914  . polyethylene glycol (MIRALAX / GLYCOLAX) packet 17 g  17 g Oral Daily Pucilowska, Jolanta B, MD   17 g at 10/26/17 1502  . polyvinyl alcohol (LIQUIFILM TEARS) 1.4 % ophthalmic solution 1 drop  1 drop Both Eyes PRN Ramond Dial, MD   1 drop at 10/30/17 0626  . prazosin (MINIPRESS) capsule 2 mg  2 mg Oral QHS Amaryllis Malmquist, Madie Reno, MD   2 mg at 10/29/17 2221  . promethazine (PHENERGAN)  tablet 12.5 mg  12.5 mg Oral Q6H PRN Jeaneen Cala T, MD   12.5 mg at 10/24/17 1234  . pyridOXINE (VITAMIN B-6) tablet 100 mg  100 mg Oral BID Kess Mcilwain, Madie Reno, MD   100 mg at 10/30/17 0913  . tiZANidine (ZANAFLEX) tablet 2 mg  2 mg Oral Q8H PRN Pucilowska, Jolanta B, MD   2 mg at 10/21/17 1600  . traZODone (DESYREL) tablet 100 mg  100 mg Oral QHS Alberto Schoch, Madie Reno, MD   100 mg at 10/29/17 2221  . triamcinolone cream (KENALOG) 0.1 %   Topical TID PRN Ramond Dial, MD   1 application at 75/10/25 2106  . vitamin C (ASCORBIC ACID) tablet 1,000 mg  1,000 mg Oral Daily Pucilowska, Jolanta B, MD   1,000 mg at 10/30/17 8527    Lab Results:  Results for orders placed or performed during the hospital encounter of 09/19/17 (from the past 48 hour(s))  Glucose, capillary     Status: None   Collection Time: 10/29/17  6:59 AM  Result Value Ref Range   Glucose-Capillary 78 70 - 99 mg/dL   Comment 1 Notify RN   Glucose, capillary     Status: None   Collection Time: 10/29/17 11:16 AM  Result Value Ref Range   Glucose-Capillary 86 70 - 99 mg/dL    Blood Alcohol level:  Lab Results  Component Value Date   ETH <5 09/29/2016   ETH <5 78/24/2353    Metabolic Disorder Labs: Lab Results  Component Value Date   HGBA1C 5.7 (H) 09/20/2017   MPG 116.89 09/20/2017   No results found for: PROLACTIN Lab Results  Component Value Date   CHOL 173 09/20/2017   TRIG 193 (H) 09/20/2017   HDL 50 09/20/2017   CHOLHDL 3.5 09/20/2017   VLDL 39 09/20/2017   LDLCALC 84 09/20/2017   LDLCALC 87 09/04/2017    Physical Findings: AIMS: Facial and Oral Movements Muscles of Facial Expression: None, normal Lips and Perioral Area: None, normal Jaw: None, normal Tongue: None, normal,Extremity Movements Upper (arms, wrists, hands, fingers): None, normal Lower (legs, knees, ankles, toes): None, normal, Trunk Movements Neck, shoulders, hips: None, normal, Overall Severity Severity of abnormal movements (highest  score from questions above): None, normal Incapacitation due to abnormal movements: None, normal Patient's awareness of abnormal movements (rate only patient's report): No Awareness, Dental Status Current problems with teeth and/or dentures?: No Does patient usually wear dentures?: No  CIWA:    COWS:     Musculoskeletal: Strength & Muscle Tone: within normal limits Gait & Station: normal Patient leans: N/A  Psychiatric Specialty Exam: Physical Exam  Nursing note and vitals reviewed. Constitutional: She appears well-developed and well-nourished.  HENT:  Head: Normocephalic and atraumatic.  Eyes: Pupils are equal, round, and reactive to light. Conjunctivae are normal.  Neck: Normal range of motion.  Cardiovascular: Regular rhythm and normal heart sounds.  Respiratory: Effort normal. No respiratory distress.  GI: Soft.  Musculoskeletal: Normal range of motion.  Neurological: She is alert.  Skin: Skin is warm and dry.  Psychiatric: Her affect is labile. Her speech is tangential. She is agitated. She is not aggressive. Thought content is not paranoid. Cognition and memory are impaired. She expresses impulsivity. She expresses no homicidal and no suicidal ideation.    Review of Systems  Constitutional: Negative.   HENT: Negative.   Eyes: Negative.   Respiratory: Negative.   Cardiovascular: Negative.   Gastrointestinal: Negative.   Musculoskeletal: Negative.   Skin: Negative.   Neurological: Negative.   Psychiatric/Behavioral: Negative.     Blood pressure 130/83, pulse (!) 58, temperature 97.7 F (36.5 C), temperature source Oral, resp. rate 16, height 5\' 3"  (1.6 m), weight 53 kg, SpO2 98 %.Body mass index is 20.7 kg/m.  General Appearance: Fairly Groomed  Eye Contact:  Fair  Speech:  Pressured  Volume:  Increased  Mood:  Euthymic  Affect:  Congruent  Thought Process:  Coherent  Orientation:  Full (Time, Place, and Person)  Thought Content:  Rumination  Suicidal  Thoughts:  No  Homicidal Thoughts:  No  Memory:  Immediate;   Fair Recent;   Fair Remote;   Fair  Judgement:  Impaired  Insight:  Shallow  Psychomotor Activity:  Normal  Concentration:  Concentration: Fair  Recall:  [name]:  Fair  Language:  Fair  Akathisia:  No  Handed:  Right  AIMS (if indicated):     Assets:  Desire for Improvement Financial Resources/Insurance Physical Health  ADL's:  Intact  Cognition:  Impaired,  Mild  Sleep:  Number of Hours: 0.75     Treatment Plan Summary: Daily contact with patient to assess and evaluate symptoms and progress in treatment, Medication management and Plan I tried to make the topic of today's conversation the fact that she needs to get discharge from the hospital and cooperate with discharge planning.  Patient was agreeable to this.  She agreed to sign the consent form to allow social work to contact other facilities about disposition.  No indication now for any change to medicine.  We are not doing ECT tomorrow because I think she needs some time off to recover slightly.  If she is still here we will proceed on Friday.  Alethia Berthold, MD 10/30/2017, 4:51 PM

## 2017-10-30 NOTE — Progress Notes (Signed)
Received Angela [name] this AM in her room asleep, she was awaken for breakfast and her medications with resistances. She ate and received her medications. She stated she was making housing housing calls this AM. She wants to talk with Dr.Clapacs related to her crying last nite related to her past losses. She requested to see the Education officer, museum. She remains needy, demanding and time consuming. Later this PM, she talked about going to a group home and was feeling comfortable with the future transition.

## 2017-10-30 NOTE — Progress Notes (Signed)
Nursing note 7p-7a  Pt observed interacting with peers on unit this shift. Displayed a flat affect and depressed mood upon interaction with this Probation officer. Pt complains of neck pain and very somatic this shift. Pt is requesting grief counseling stated that she is so hopeless and sad that she had so many people close to her die. She also complained of dry eyes, anxiety, and dry lips. See MAR for prn medication administration. Pt denies SI/HI, and also denies any audio or visual hallucinations at this time. Pt is able to verbally contract for safety with this RN. Goal: "to find housing in [place]"  Pt educated on falls and encouraged to wear nonskid socks when ambulating in the halls. Pt also encouraged to call for help if feeling weak or dizzy. Pt verbalized understanding of all education provided. Pt is now resting in bed with eyes closed, with no signs or symptoms of pain or distress noted. Pt continues to remain safe on the unit and is observed by rounding every 15 min. RN will continue to monitor.

## 2017-10-30 NOTE — Progress Notes (Signed)
Recreation Therapy Notes  Date: 10/30/2017  Time: 9:30 am   Location: Craft room   Behavioral response: N/A   Intervention Topic: Life planning  Discussion/Intervention: Patient did not attend group.   Clinical Observations/Feedback:  Patient did not attend group.   Trayton Szabo LRT/CTRS        Othella Slappey 10/30/2017 10:33 AM

## 2017-10-30 NOTE — BHH Counselor (Signed)
CSW faxed all requested information on the patient to [place] Martes with [facility] in [place]. Peter Congo reports that she does have a female bed available and will look over all paperwork and contact the CSW back. She requested that the patient have a TB test completed as well.   Darin Engels, MSW, Latanya Presser, [place] Clinical Social Worker 10/30/2017 4:32 PM

## 2017-10-31 LAB — GLUCOSE, CAPILLARY: Glucose-Capillary: 82 mg/dL (ref 70–99)

## 2017-10-31 NOTE — Progress Notes (Signed)
[facility] MD Progress Note  10/31/2017 4:33 PM Angela [name]  MRN:  [id number] Subjective: Follow-up for patient with chronic mood instability.  Patient today actually seems to be in better spirits and more appropriately insightful and thoughtful than I can recall seeing her.  She was able to acknowledge that there may be good reasons why people have excluded her because of her behavior in the past.  She acknowledges that she is feeling better.  She is not acting out and she is taking better care of her ADLs.  She is still frustrated at not having a clear disposition. Principal Problem: Bipolar I disorder, most recent episode mixed, severe with psychotic features ([place]) Diagnosis:   Patient Active Problem List   Diagnosis Date Noted  . Bipolar I disorder, most recent episode mixed, severe with psychotic features ([place]) [F31.64] 09/18/2017  . Agitation [R45.1] 09/18/2017  . IBS (irritable bowel syndrome) [K58.9] 11/17/2016  . Bilateral carpal tunnel syndrome [G56.03] 11/17/2016  . Plantar fasciitis, bilateral [M72.2] 11/17/2016  . Chronic pain syndrome [G89.4] 11/17/2016  . Chronic hyponatremia [E87.1] 11/17/2016  . PTSD (post-traumatic stress disorder) [F43.10] 11/17/2016  . Hernia, hiatal [K44.9] 11/17/2016  . Vitamin D deficiency [E55.9] 11/17/2016  . Vitamin B12 deficiency [E53.8] 11/17/2016  . SIADH (syndrome of inappropriate ADH production) ([place]) [E22.2] 09/08/2016  . Cervical spondylosis with myelopathy [M47.12] 05/24/2016  . Post-concussion headache [G44.309] 05/24/2016  . MCI (mild cognitive impairment) with memory loss [G31.84] 03/30/2016  . Gait abnormality [R26.9] 03/30/2016  . Chronic bipolar affective disorder ([place]) [F31.9] 01/24/2016  . Anemia, iron deficiency [D50.9] 06/24/2015  . Endometriosis [N80.9] 06/24/2015  . Essential hypertension [I10] 06/24/2015  . History of postmenopausal bleeding [Z87.42] 05/22/2015  . History of TIA (transient ischemic attack) [Z86.73]  04/20/2015  . Hypothyroidism [E03.9] 04/20/2015  . GERD (gastroesophageal reflux disease) [K21.9] 04/20/2015  . Cervical disc disorder with radiculopathy [M50.10] 03/25/2015  . Chronic neck pain [M54.2, G89.29] 03/25/2015  . Pelvic pain in female [R10.2] 10/30/2014  . History of skin cancer [Z85.828] 06/30/2014  . Chronic headaches [R51] 12/16/2013  . Neuropathy [G62.9] 12/16/2013   Total Time spent with patient: 20 minutes  Past Psychiatric History: Long-standing mental health issues as documented elsewhere  Past Medical History:  Past Medical History:  Diagnosis Date  . Allergic rhinitis   . Anemia   . Blurred vision   . Depression   . Diverticulosis   . Endometriosis   . Falls   . GERD (gastroesophageal reflux disease)   . Hernia, hiatal   . Hypothyroidism   . IBS (irritable bowel syndrome)   . Malignant neoplasm of skin   . Migraine   . Neuropathy   . PTSD (post-traumatic stress disorder)   . Thyroid disease     Past Surgical History:  Procedure Laterality Date  . APPENDECTOMY    . CHOLECYSTECTOMY    . CHOLECYSTECTOMY, LAPAROSCOPIC    . ESOPHAGOGASTRODUODENOSCOPY (EGD) WITH PROPOFOL N/A 03/29/2015   Procedure: ESOPHAGOGASTRODUODENOSCOPY (EGD) WITH PROPOFOL;  Surgeon: Josefine Class, MD;  [facility] ENDOSCOPY;  Service: Endoscopy;  Laterality: N/A;  . HERNIA REPAIR    . laproscopy    . TONSILLECTOMY    . uteral suspension     Family History:  Family History  Problem Relation Age of Onset  . Heart disease Father   . Cancer Mother        lung  . Urolithiasis Neg Hx   . Kidney disease Neg Hx   . Kidney cancer Neg Hx   .  Prostate cancer Neg Hx    Family Psychiatric  History: See previous note Social History:  Social History   Substance and Sexual Activity  Alcohol Use No     Social History   Substance and Sexual Activity  Drug Use No    Social History   Socioeconomic History  . Marital status: Single    Spouse name: Not on file  .  Number of children: 0  . Years of education: 1.5 years of college  . Highest education level: Not on file  Occupational History  . Occupation: Retired  Scientific laboratory technician  . Financial resource strain: Not on file  . Food insecurity:    Worry: Not on file    Inability: Not on file  . Transportation needs:    Medical: Not on file    Non-medical: Not on file  Tobacco Use  . Smoking status: Former Smoker    Last attempt to quit: 11/17/1976    Years since quitting: 40.9  . Smokeless tobacco: Never Used  Substance and Sexual Activity  . Alcohol use: No  . Drug use: No  . Sexual activity: Not Currently  Lifestyle  . Physical activity:    Days per week: Not on file    Minutes per session: Not on file  . Stress: Not on file  Relationships  . Social connections:    Talks on phone: Not on file    Gets together: Not on file    Attends religious service: Not on file    Active member of club or organization: Not on file    Attends meetings of clubs or organizations: Not on file    Relationship status: Not on file  Other Topics Concern  . Not on file  Social History Narrative   Lives at home alone.   Right-handed.   No daily caffeine use.   Additional Social History:    Pain Medications: See PTA Prescriptions: See PTA Over the Counter: See PTA History of alcohol / drug use?: No history of alcohol / drug abuse Longest period of sobriety (when/how long): Reports of none Negative Consequences of Use: (n/a) Withdrawal Symptoms: (n/a)                    Sleep: Fair  Appetite:  Fair  Current Medications: Current Facility-Administered Medications  Medication Dose Route Frequency Provider Last Rate Last Dose  . acetaminophen (TYLENOL) tablet 650 mg  650 mg Oral Q6H PRN Treyshaun Keatts, Madie Reno, MD   650 mg at 10/31/17 1501  . alum & mag hydroxide-simeth (MAALOX/MYLANTA) 200-200-20 MG/5ML suspension 30 mL  30 mL Oral Q4H PRN Keshav Winegar T, MD   30 mL at 10/08/17 1507  . amLODipine  (NORVASC) tablet 5 mg  5 mg Oral BID Edan Juday, Madie Reno, MD   5 mg at 10/31/17 1215  . calcium carbonate (TUMS - dosed in mg elemental calcium) chewable tablet 400 mg of elemental calcium  2 tablet Oral Q breakfast Pucilowska, Jolanta B, MD   400 mg of elemental calcium at 10/31/17 1216  . cholecalciferol (VITAMIN D) tablet 1,000 Units  1,000 Units Oral Daily Pucilowska, Jolanta B, MD   1,000 Units at 10/31/17 1215  . cycloSPORINE (RESTASIS) 0.05 % ophthalmic emulsion 1 drop  1 drop Both Eyes BID Lulu Hirschmann, Madie Reno, MD   1 drop at 10/31/17 1216  . diclofenac sodium (VOLTAREN) 1 % transdermal gel 4 g  4 g Topical QID Pucilowska, Jolanta B, MD   4 g at  10/31/17 1233  . feeding supplement (BOOST / RESOURCE BREEZE) liquid 1 Container  1 Container Oral TID BM Austina Constantin, Madie Reno, MD   1 Container at 10/31/17 1400  . fentaNYL (SUBLIMAZE) injection 25 mcg  25 mcg Intravenous Q5 min PRN Alvin Critchley, MD      . ferrous sulfate tablet 325 mg  325 mg Oral Q breakfast Pucilowska, Jolanta B, MD   325 mg at 10/31/17 1216  . gabapentin (NEURONTIN) capsule 100 mg  100 mg Oral BID Deneen Slager, Madie Reno, MD   100 mg at 10/31/17 1215  . glycopyrrolate (ROBINUL) injection 0.1 mg  0.1 mg Intravenous Once Elius Etheredge T, MD      . hydrocortisone cream 1 %   Topical BID Luian Schumpert T, MD      . ipratropium (ATROVENT) 0.03 % nasal spray 2 spray  2 spray Each Nare TID He, Jun, MD   2 spray at 10/31/17 1217  . levalbuterol (XOPENEX) nebulizer solution 0.63 mg  0.63 mg Nebulization Q1400 Jessy Calixte, Madie Reno, MD   0.63 mg at 10/31/17 1336  . levothyroxine (SYNTHROID, LEVOTHROID) tablet 100 mcg  100 mcg Oral QAC breakfast Pucilowska, Jolanta B, MD   100 mcg at 10/31/17 1237  . lidocaine (LIDODERM) 5 % 1 patch  1 patch Transdermal Q24H Pucilowska, Jolanta B, MD   1 patch at 10/30/17 0926  . linaclotide (LINZESS) capsule 290 mcg  290 mcg Oral QAC breakfast Pucilowska, Jolanta B, MD   290 mcg at 10/30/17 0624  . lip balm (BLISTEX) ointment    Topical PRN Anae Hams T, MD      . loperamide (IMODIUM) capsule 2 mg  2 mg Oral PRN Pucilowska, Jolanta B, MD   2 mg at 10/18/17 0002  . LORazepam (ATIVAN) tablet 0.5 mg  0.5 mg Oral Q6H PRN Assata Juncaj, Madie Reno, MD   0.5 mg at 10/30/17 0033  . magnesium hydroxide (MILK OF MAGNESIA) suspension 30 mL  30 mL Oral Daily PRN Kristi Norment, Madie Reno, MD   30 mL at 10/05/17 1731  . magnesium oxide (MAG-OX) tablet 200 mg  200 mg Oral BID Pucilowska, Jolanta B, MD   200 mg at 10/31/17 1215  . midazolam (VERSED) injection 2 mg  2 mg Intravenous Once Catheline Hixon T, MD      . multivitamin with minerals tablet 1 tablet  1 tablet Oral Daily Pucilowska, Jolanta B, MD   1 tablet at 10/31/17 1214  . omega-3 acid ethyl esters (LOVAZA) capsule 1 g  1 g Oral BID Pucilowska, Jolanta B, MD   1 g at 10/31/17 1214  . ondansetron (ZOFRAN) injection 4 mg  4 mg Intravenous Once Tamy Accardo T, MD      . ondansetron [facility]) injection 4 mg  4 mg Intravenous Once PRN Gunnar Fusi, MD      . ondansetron [facility]) injection 4 mg  4 mg Intravenous Once PRN Gunnar Bulla, MD      . ondansetron [facility]) injection 4 mg  4 mg Intravenous Once PRN Alvin Critchley, MD      . ondansetron [facility]) injection 4 mg  4 mg Intravenous Once Janielle Mittelstadt T, MD      . ondansetron [facility]) injection 4 mg  4 mg Intravenous Once PRN Alvin Critchley, MD      . ondansetron (ZOFRAN-ODT) disintegrating tablet 4 mg  4 mg Oral Q8H PRN Pucilowska, Jolanta B, MD   4 mg at 10/08/17 1807  . PARoxetine (PAXIL-CR) 24 hr tablet 25  mg  25 mg Oral Daily Brehanna Deveny, Madie Reno, MD   25 mg at 10/31/17 1215  . polyethylene glycol (MIRALAX / GLYCOLAX) packet 17 g  17 g Oral Daily Pucilowska, Jolanta B, MD   17 g at 10/26/17 1502  . polyvinyl alcohol (LIQUIFILM TEARS) 1.4 % ophthalmic solution 1 drop  1 drop Both Eyes PRN Ramond Dial, MD   1 drop at 10/30/17 0626  . prazosin (MINIPRESS) capsule 2 mg  2 mg Oral QHS Alejos Reinhardt, Madie Reno, MD   2 mg at 10/30/17 2148  .  promethazine (PHENERGAN) tablet 12.5 mg  12.5 mg Oral Q6H PRN Kashay Cavenaugh T, MD   12.5 mg at 10/24/17 1234  . pyridOXINE (VITAMIN B-6) tablet 100 mg  100 mg Oral BID Brogan Martis, Madie Reno, MD   100 mg at 10/31/17 1214  . tiZANidine (ZANAFLEX) tablet 2 mg  2 mg Oral Q8H PRN Pucilowska, Jolanta B, MD   2 mg at 10/21/17 1600  . traZODone (DESYREL) tablet 100 mg  100 mg Oral QHS Jayjay Littles, Madie Reno, MD   100 mg at 10/30/17 2149  . triamcinolone cream (KENALOG) 0.1 %   Topical TID PRN Ramond Dial, MD   1 application at 97/98/92 2106  . vitamin C (ASCORBIC ACID) tablet 1,000 mg  1,000 mg Oral Daily Pucilowska, Jolanta B, MD   1,000 mg at 10/31/17 1231    Lab Results:  Results for orders placed or performed during the hospital encounter of 09/19/17 (from the past 48 hour(s))  Glucose, capillary     Status: None   Collection Time: 10/31/17  6:03 AM  Result Value Ref Range   Glucose-Capillary 82 70 - 99 mg/dL    Blood Alcohol level:  Lab Results  Component Value Date   ETH <5 09/29/2016   ETH <5 11/94/1740    Metabolic Disorder Labs: Lab Results  Component Value Date   HGBA1C 5.7 (H) 09/20/2017   MPG 116.89 09/20/2017   No results found for: PROLACTIN Lab Results  Component Value Date   CHOL 173 09/20/2017   TRIG 193 (H) 09/20/2017   HDL 50 09/20/2017   CHOLHDL 3.5 09/20/2017   VLDL 39 09/20/2017   LDLCALC 84 09/20/2017   LDLCALC 87 09/04/2017    Physical Findings: AIMS: Facial and Oral Movements Muscles of Facial Expression: None, normal Lips and Perioral Area: None, normal Jaw: None, normal Tongue: None, normal,Extremity Movements Upper (arms, wrists, hands, fingers): None, normal Lower (legs, knees, ankles, toes): None, normal, Trunk Movements Neck, shoulders, hips: None, normal, Overall Severity Severity of abnormal movements (highest score from questions above): None, normal Incapacitation due to abnormal movements: None, normal Patient's awareness of abnormal movements  (rate only patient's report): No Awareness, Dental Status Current problems with teeth and/or dentures?: No Does patient usually wear dentures?: No  CIWA:    COWS:     Musculoskeletal: Strength & Muscle Tone: within normal limits Gait & Station: normal Patient leans: N/A  Psychiatric Specialty Exam: Physical Exam  Nursing note and vitals reviewed. Constitutional: She appears well-developed and well-nourished.  HENT:  Head: Normocephalic and atraumatic.  Eyes: Pupils are equal, round, and reactive to light. Conjunctivae are normal.  Neck: Normal range of motion.  Cardiovascular: Regular rhythm and normal heart sounds.  Respiratory: Effort normal.  GI: Soft.  Musculoskeletal: Normal range of motion.  Neurological: She is alert.  Skin: Skin is warm and dry.  Psychiatric: She has a normal mood and affect. Her behavior is normal. Judgment and thought  content normal.    Review of Systems  Constitutional: Negative.   HENT: Negative.   Eyes: Negative.   Respiratory: Negative.   Cardiovascular: Negative.   Gastrointestinal: Negative.   Musculoskeletal: Negative.   Skin: Negative.   Neurological: Negative.   Psychiatric/Behavioral: Negative for depression, hallucinations, memory loss, substance abuse and suicidal ideas. The patient is not nervous/anxious and does not have insomnia.     Blood pressure (!) 115/93, pulse (!) 58, temperature 98 F (36.7 C), temperature source Oral, resp. rate 16, height 5\' 3"  (1.6 m), weight 53 kg, SpO2 100 %.Body mass index is 20.7 kg/m.  General Appearance: Casual  Eye Contact:  Good  Speech:  Clear and Coherent  Volume:  Normal  Mood:  Euthymic  Affect:  Congruent  Thought Process:  Goal Directed  Orientation:  Full (Time, Place, and Person)  Thought Content:  Logical  Suicidal Thoughts:  No  Homicidal Thoughts:  No  Memory:  Immediate;   Fair Recent;   Fair Remote;   Fair  Judgement:  Fair  Insight:  Fair  Psychomotor Activity:  Normal   Concentration:  Concentration: Fair  Recall:  [name]:  Fair  Language:  Fair  Akathisia:  No  Handed:  Right  AIMS (if indicated):     Assets:  Desire for Improvement Resilience  ADL's:  Impaired  Cognition:  WNL  Sleep:  Number of Hours: 0.75     Treatment Plan Summary: Daily contact with patient to assess and evaluate symptoms and progress in treatment, Medication management and Plan Patient is doing pretty well.  It was actually somewhat reasonable and helpful to spend some time listening to her complaints today.  She is not actively suicidal or dangerous.  She is benefiting from ECT and has only a little bit of cognitive problem from it so we are going to plan to go ahead with treatment again tomorrow.  Meanwhile we are very much trying to work on appropriate discharge planning.  I encouraged her in the strongest terms to say yes to a possible placement in [place] which apparently has been floated.  Alethia Berthold, MD 10/31/2017, 4:33 PM

## 2017-10-31 NOTE — Plan of Care (Signed)
Patient is calm this evening , responding well to therapy and voice no complain so far, contract for safety of self, patient is  getting excited about possible discharge this Friday, denies and SI/HI or AVH, 15 minute rounding check in progress no distress.   Problem: Education: Goal: Ability to make informed decisions regarding treatment will improve Outcome: Progressing   Problem: Coping: Goal: Coping ability will improve Outcome: Progressing   Problem: Health Behavior/Discharge Planning: Goal: Identification of resources available to assist in meeting health care needs will improve Outcome: Progressing   Problem: Medication: Goal: Compliance with prescribed medication regimen will improve Outcome: Progressing   Problem: Self-Concept: Goal: Ability to disclose and discuss suicidal ideas will improve Outcome: Progressing Goal: Will verbalize positive feelings about self Outcome: Progressing   Problem: Safety: Goal: Ability to remain free from injury will improve Outcome: Progressing   Problem: Spiritual Needs Goal: Ability to function at adequate level Outcome: Progressing

## 2017-10-31 NOTE — BHH Counselor (Signed)
CSW called Dayton Martes with [facility] in [place]. Peter Congo reports that she will review the patients information and call back with a decision. The soonest she can take the patient is early next week if accepted. A TB test will need to be completed and read prior to discharge.   Darin Engels, MSW, [place] Clinical Social Worker 10/31/2017 4:06 PM

## 2017-10-31 NOTE — BHH Group Notes (Signed)
[place] Group Notes:  (Nursing/MHT/Case Management/Adjunct)  Date:  10/31/2017  Time:  2:44 PM  Type of Therapy:  Psychoeducational Skills  Participation Level:  Did Not Attend  Adela Lank [facility] 10/31/2017, 2:44 PM

## 2017-10-31 NOTE — Progress Notes (Signed)
Recreation Therapy Notes  Date: 10/31/2017  Time: 9:30 am   Location: Craft room   Behavioral response: N/A   Intervention Topic: Goals  Discussion/Intervention: Patient did not attend group.   Clinical Observations/Feedback:  Patient did not attend group.   Kathren Scearce LRT/CTRS        Karine Garn 10/31/2017 11:22 AM

## 2017-10-31 NOTE — Progress Notes (Signed)
Received Angela [name] this AM in her room asleep, she decided to sleep through breakfast. She was OOB for lunch, medication compliant and OOB in the milieu with her peers. During our one to one assessment she endorsed feeling sad related to her housing situation.She has accepted her transition to a group home in [place].

## 2017-10-31 NOTE — BHH Group Notes (Signed)
  Emotional Regulation October 31, 2017  1PM  Type of Therapy/Topic:  Group Therapy:  Emotion Regulation  Participation Level:  Active   Description of Group:   The purpose of this group is to assist patients in learning to regulate negative emotions and experience positive emotions. Patients will be guided to discuss ways in which they have been vulnerable to their negative emotions. These vulnerabilities will be juxtaposed with experiences of positive emotions or situations, and patients will be challenged to use positive emotions to combat negative ones. Special emphasis will be placed on coping with negative emotions in conflict situations, and patients will process healthy conflict resolution skills.  Therapeutic Goals: 1. Patient will identify two positive emotions or experiences to reflect on in order to balance out negative emotions 2. Patient will label two or more emotions that they find the most difficult to experience 3. Patient will demonstrate positive conflict resolution skills through discussion and/or role plays  Summary of Patient Progress: This patient was able to follow group and not interrupt, she did veer off topic but was easily redirected. She was falling asleep toward the end of group. She could identify most the anger emotions and shared her insight with her peers.   Therapeutic Modalities:   Cognitive Behavioral Therapy Feelings Identification Dialectical Behavioral Therapy  [place] Shervin Cypert LCSW [phone]

## 2017-11-01 ENCOUNTER — Other Ambulatory Visit: Payer: Self-pay | Admitting: Psychiatry

## 2017-11-01 MED ORDER — TUBERCULIN PPD 5 UNIT/0.1ML ID SOLN
5.0000 [IU] | Freq: Once | INTRADERMAL | Status: AC
  Administered 2017-11-01: 5 [IU] via INTRADERMAL
  Filled 2017-11-01: qty 0.1

## 2017-11-01 NOTE — Progress Notes (Signed)
[name] has remained in the milieu, frequently presenting to the nurses station, wanting to talk to nurses "I  Don't think that anybody likes me, I don't how I am going to live without any friends, I have no body, not a single person that can stand me". Patient discussed about her childhood, reporting that "my mother made me feel not good enough and I grew up with it, now she is dead, I have nobody in my life...". Patient has been anxious, seeking attention. Continues to call staff frequently. Currently in room awake. Emotional and therapeutic  support provided. Safety precautions reinforced.

## 2017-11-01 NOTE — Progress Notes (Signed)
[facility] MD Progress Note  11/01/2017 6:28 PM Angela [name]  MRN:  [id number] Subjective: Follow-up for this patient with chronic mood lability bipolar disorder.  She had a bad day today.  Patient was agitated today and upset.  Arguing with staff.  She denies suicidal ideation.  Took a long time to get her to calm down.  Not aggressive or violent but as usual agitated with poor insight.  Patient is aware that she is having some short-term memory problems Principal Problem: Bipolar I disorder, most recent episode mixed, severe with psychotic features ([place]) Diagnosis:   Patient Active Problem List   Diagnosis Date Noted  . Bipolar I disorder, most recent episode mixed, severe with psychotic features ([place]) [F31.64] 09/18/2017  . Agitation [R45.1] 09/18/2017  . IBS (irritable bowel syndrome) [K58.9] 11/17/2016  . Bilateral carpal tunnel syndrome [G56.03] 11/17/2016  . Plantar fasciitis, bilateral [M72.2] 11/17/2016  . Chronic pain syndrome [G89.4] 11/17/2016  . Chronic hyponatremia [E87.1] 11/17/2016  . PTSD (post-traumatic stress disorder) [F43.10] 11/17/2016  . Hernia, hiatal [K44.9] 11/17/2016  . Vitamin D deficiency [E55.9] 11/17/2016  . Vitamin B12 deficiency [E53.8] 11/17/2016  . SIADH (syndrome of inappropriate ADH production) ([place]) [E22.2] 09/08/2016  . Cervical spondylosis with myelopathy [M47.12] 05/24/2016  . Post-concussion headache [G44.309] 05/24/2016  . MCI (mild cognitive impairment) with memory loss [G31.84] 03/30/2016  . Gait abnormality [R26.9] 03/30/2016  . Chronic bipolar affective disorder ([place]) [F31.9] 01/24/2016  . Anemia, iron deficiency [D50.9] 06/24/2015  . Endometriosis [N80.9] 06/24/2015  . Essential hypertension [I10] 06/24/2015  . History of postmenopausal bleeding [Z87.42] 05/22/2015  . History of TIA (transient ischemic attack) [Z86.73] 04/20/2015  . Hypothyroidism [E03.9] 04/20/2015  . GERD (gastroesophageal reflux disease) [K21.9] 04/20/2015  .  Cervical disc disorder with radiculopathy [M50.10] 03/25/2015  . Chronic neck pain [M54.2, G89.29] 03/25/2015  . Pelvic pain in female [R10.2] 10/30/2014  . History of skin cancer [Z85.828] 06/30/2014  . Chronic headaches [R51] 12/16/2013  . Neuropathy [G62.9] 12/16/2013   Total Time spent with patient: 20 minutes  Past Psychiatric History: History of chronic mental health problems mood instability  Past Medical History:  Past Medical History:  Diagnosis Date  . Allergic rhinitis   . Anemia   . Blurred vision   . Depression   . Diverticulosis   . Endometriosis   . Falls   . GERD (gastroesophageal reflux disease)   . Hernia, hiatal   . Hypothyroidism   . IBS (irritable bowel syndrome)   . Malignant neoplasm of skin   . Migraine   . Neuropathy   . PTSD (post-traumatic stress disorder)   . Thyroid disease     Past Surgical History:  Procedure Laterality Date  . APPENDECTOMY    . CHOLECYSTECTOMY    . CHOLECYSTECTOMY, LAPAROSCOPIC    . ESOPHAGOGASTRODUODENOSCOPY (EGD) WITH PROPOFOL N/A 03/29/2015   Procedure: ESOPHAGOGASTRODUODENOSCOPY (EGD) WITH PROPOFOL;  Surgeon: Josefine Class, MD;  Location: [facility];  Service: Endoscopy;  Laterality: N/A;  . HERNIA REPAIR    . laproscopy    . TONSILLECTOMY    . uteral suspension     Family History:  Family History  Problem Relation Age of Onset  . Heart disease Father   . Cancer Mother        lung  . Urolithiasis Neg Hx   . Kidney disease Neg Hx   . Kidney cancer Neg Hx   . Prostate cancer Neg Hx    Family Psychiatric  History: See prior Social History:  Social History   Substance and Sexual Activity  Alcohol Use No     Social History   Substance and Sexual Activity  Drug Use No    Social History   Socioeconomic History  . Marital status: Single    Spouse name: Not on file  . Number of children: 0  . Years of education: 1.5 years of college  . Highest education level: Not on file  Occupational  History  . Occupation: Retired  Scientific laboratory technician  . Financial resource strain: Not on file  . Food insecurity:    Worry: Not on file    Inability: Not on file  . Transportation needs:    Medical: Not on file    Non-medical: Not on file  Tobacco Use  . Smoking status: Former Smoker    Last attempt to quit: 11/17/1976    Years since quitting: 40.9  . Smokeless tobacco: Never Used  Substance and Sexual Activity  . Alcohol use: No  . Drug use: No  . Sexual activity: Not Currently  Lifestyle  . Physical activity:    Days per week: Not on file    Minutes per session: Not on file  . Stress: Not on file  Relationships  . Social connections:    Talks on phone: Not on file    Gets together: Not on file    Attends religious service: Not on file    Active member of club or organization: Not on file    Attends meetings of clubs or organizations: Not on file    Relationship status: Not on file  Other Topics Concern  . Not on file  Social History Narrative   Lives at home alone.   Right-handed.   No daily caffeine use.   Additional Social History:    Pain Medications: See PTA Prescriptions: See PTA Over the Counter: See PTA History of alcohol / drug use?: No history of alcohol / drug abuse Longest period of sobriety (when/how long): Reports of none Negative Consequences of Use: (n/a) Withdrawal Symptoms: (n/a)                    Sleep: Poor  Appetite:  Fair  Current Medications: Current Facility-Administered Medications  Medication Dose Route Frequency Provider Last Rate Last Dose  . acetaminophen (TYLENOL) tablet 650 mg  650 mg Oral Q6H PRN Malcolm Quast, Madie Reno, MD   650 mg at 11/01/17 1357  . alum & mag hydroxide-simeth (MAALOX/MYLANTA) 200-200-20 MG/5ML suspension 30 mL  30 mL Oral Q4H PRN Daleena Rotter T, MD   30 mL at 10/08/17 1507  . amLODipine (NORVASC) tablet 5 mg  5 mg Oral BID Percival Glasheen, Madie Reno, MD   5 mg at 11/01/17 1355  . calcium carbonate (TUMS - dosed in mg  elemental calcium) chewable tablet 400 mg of elemental calcium  2 tablet Oral Q breakfast Pucilowska, Jolanta B, MD   400 mg of elemental calcium at 11/01/17 1354  . cholecalciferol (VITAMIN D) tablet 1,000 Units  1,000 Units Oral Daily Pucilowska, Jolanta B, MD   1,000 Units at 11/01/17 1354  . cycloSPORINE (RESTASIS) 0.05 % ophthalmic emulsion 1 drop  1 drop Both Eyes BID Shawntell Dixson, Madie Reno, MD   1 drop at 11/01/17 1353  . diclofenac sodium (VOLTAREN) 1 % transdermal gel 4 g  4 g Topical QID Pucilowska, Jolanta B, MD   4 g at 10/31/17 2200  . feeding supplement (BOOST / RESOURCE BREEZE) liquid 1 Container  1 Container Oral  TID BM Melondy Blanchard, Madie Reno, MD   1 Container at 10/31/17 2213  . fentaNYL (SUBLIMAZE) injection 25 mcg  25 mcg Intravenous Q5 min PRN Alvin Critchley, MD      . ferrous sulfate tablet 325 mg  325 mg Oral Q breakfast Pucilowska, Jolanta B, MD   325 mg at 11/01/17 1359  . gabapentin (NEURONTIN) capsule 100 mg  100 mg Oral BID Maleiya Pergola T, MD   100 mg at 11/01/17 1356  . glycopyrrolate (ROBINUL) injection 0.1 mg  0.1 mg Intravenous Once Rylee Nuzum T, MD      . hydrocortisone cream 1 %   Topical BID Brentlee Delage T, MD      . ipratropium (ATROVENT) 0.03 % nasal spray 2 spray  2 spray Each Nare TID He, Jun, MD   2 spray at 11/01/17 1352  . levalbuterol (XOPENEX) nebulizer solution 0.63 mg  0.63 mg Nebulization Q1400 Jaiquan Temme, Madie Reno, MD   0.63 mg at 11/01/17 1443  . levothyroxine (SYNTHROID, LEVOTHROID) tablet 100 mcg  100 mcg Oral QAC breakfast Pucilowska, Jolanta B, MD   100 mcg at 11/01/17 0634  . lidocaine (LIDODERM) 5 % 1 patch  1 patch Transdermal Q24H Pucilowska, Jolanta B, MD   1 patch at 10/30/17 0926  . linaclotide (LINZESS) capsule 290 mcg  290 mcg Oral QAC breakfast Pucilowska, Jolanta B, MD   290 mcg at 11/01/17 0634  . lip balm (BLISTEX) ointment   Topical PRN Gerell Fortson, Madie Reno, MD   1 application at 12/45/80 1845  . loperamide (IMODIUM) capsule 2 mg  2 mg Oral PRN  Pucilowska, Jolanta B, MD   2 mg at 10/18/17 0002  . LORazepam (ATIVAN) tablet 0.5 mg  0.5 mg Oral Q6H PRN Stellah Donovan, Madie Reno, MD   0.5 mg at 10/30/17 0033  . magnesium hydroxide (MILK OF MAGNESIA) suspension 30 mL  30 mL Oral Daily PRN Jamilette Suchocki, Madie Reno, MD   30 mL at 10/05/17 1731  . magnesium oxide (MAG-OX) tablet 200 mg  200 mg Oral BID Pucilowska, Jolanta B, MD   200 mg at 11/01/17 1401  . midazolam (VERSED) injection 2 mg  2 mg Intravenous Once Dayle Sherpa T, MD      . multivitamin with minerals tablet 1 tablet  1 tablet Oral Daily Pucilowska, Jolanta B, MD   1 tablet at 11/01/17 1354  . omega-3 acid ethyl esters (LOVAZA) capsule 1 g  1 g Oral BID Pucilowska, Jolanta B, MD   1 g at 11/01/17 1355  . ondansetron (ZOFRAN) injection 4 mg  4 mg Intravenous Once Hilda Rynders T, MD      . ondansetron [facility]) injection 4 mg  4 mg Intravenous Once PRN Gunnar Fusi, MD      . ondansetron [facility]) injection 4 mg  4 mg Intravenous Once PRN Gunnar Bulla, MD      . ondansetron [facility]) injection 4 mg  4 mg Intravenous Once PRN Alvin Critchley, MD      . ondansetron [facility]) injection 4 mg  4 mg Intravenous Once Larayne Baxley T, MD      . ondansetron [facility]) injection 4 mg  4 mg Intravenous Once PRN Alvin Critchley, MD      . ondansetron (ZOFRAN-ODT) disintegrating tablet 4 mg  4 mg Oral Q8H PRN Pucilowska, Jolanta B, MD   4 mg at 10/08/17 1807  . PARoxetine (PAXIL-CR) 24 hr tablet 25 mg  25 mg Oral Daily Tajha Sammarco, Madie Reno, MD   25 mg  at 11/01/17 1355  . polyethylene glycol (MIRALAX / GLYCOLAX) packet 17 g  17 g Oral Daily Pucilowska, Jolanta B, MD   17 g at 10/26/17 1502  . polyvinyl alcohol (LIQUIFILM TEARS) 1.4 % ophthalmic solution 1 drop  1 drop Both Eyes PRN Ramond Dial, MD   1 drop at 10/30/17 0626  . prazosin (MINIPRESS) capsule 2 mg  2 mg Oral QHS Kameisha Malicki, Madie Reno, MD   2 mg at 10/31/17 2159  . promethazine (PHENERGAN) tablet 12.5 mg  12.5 mg Oral Q6H PRN Indira Sorenson T, MD   12.5 mg at  10/24/17 1234  . pyridOXINE (VITAMIN B-6) tablet 100 mg  100 mg Oral BID Kiaira Pointer, Madie Reno, MD   100 mg at 11/01/17 1355  . tiZANidine (ZANAFLEX) tablet 2 mg  2 mg Oral Q8H PRN Pucilowska, Jolanta B, MD   2 mg at 10/21/17 1600  . traZODone (DESYREL) tablet 100 mg  100 mg Oral QHS Kailyn Vanderslice, Madie Reno, MD   100 mg at 10/31/17 2159  . triamcinolone cream (KENALOG) 0.1 %   Topical TID PRN Ramond Dial, MD   1 application at 16/38/45 2106  . tuberculin injection 5 Units  5 Units Intradermal Once Pucilowska, Jolanta B, MD   5 Units at 11/01/17 1404  . vitamin C (ASCORBIC ACID) tablet 1,000 mg  1,000 mg Oral Daily Pucilowska, Jolanta B, MD   1,000 mg at 11/01/17 1400    Lab Results:  Results for orders placed or performed during the hospital encounter of 09/19/17 (from the past 48 hour(s))  Glucose, capillary     Status: None   Collection Time: 10/31/17  6:03 AM  Result Value Ref Range   Glucose-Capillary 82 70 - 99 mg/dL    Blood Alcohol level:  Lab Results  Component Value Date   ETH <5 09/29/2016   ETH <5 36/46/8032    Metabolic Disorder Labs: Lab Results  Component Value Date   HGBA1C 5.7 (H) 09/20/2017   MPG 116.89 09/20/2017   No results found for: PROLACTIN Lab Results  Component Value Date   CHOL 173 09/20/2017   TRIG 193 (H) 09/20/2017   HDL 50 09/20/2017   CHOLHDL 3.5 09/20/2017   VLDL 39 09/20/2017   LDLCALC 84 09/20/2017   LDLCALC 87 09/04/2017    Physical Findings: AIMS: Facial and Oral Movements Muscles of Facial Expression: None, normal Lips and Perioral Area: None, normal Jaw: None, normal Tongue: None, normal,Extremity Movements Upper (arms, wrists, hands, fingers): None, normal Lower (legs, knees, ankles, toes): None, normal, Trunk Movements Neck, shoulders, hips: None, normal, Overall Severity Severity of abnormal movements (highest score from questions above): None, normal Incapacitation due to abnormal movements: None, normal Patient's awareness of  abnormal movements (rate only patient's report): No Awareness, Dental Status Current problems with teeth and/or dentures?: No Does patient usually wear dentures?: No  CIWA:    COWS:     Musculoskeletal: Strength & Muscle Tone: within normal limits Gait & Station: normal Patient leans: N/A  Psychiatric Specialty Exam: Physical Exam  Nursing note and vitals reviewed. Constitutional: She appears well-developed and well-nourished.  HENT:  Head: Normocephalic and atraumatic.  Eyes: Pupils are equal, round, and reactive to light. Conjunctivae are normal.  Neck: Normal range of motion.  Cardiovascular: Regular rhythm and normal heart sounds.  Respiratory: Effort normal.  GI: Soft.  Musculoskeletal: Normal range of motion.  Neurological: She is alert.  Skin: Skin is warm and dry.  Psychiatric: Her affect is labile. Her speech  is tangential. She is agitated. She is not aggressive. Thought content is not paranoid. Cognition and memory are impaired. She expresses impulsivity. She expresses no homicidal and no suicidal ideation.    Review of Systems  Constitutional: Negative.   HENT: Negative.   Eyes: Negative.   Respiratory: Negative.   Cardiovascular: Negative.   Gastrointestinal: Negative.   Musculoskeletal: Negative.   Skin: Negative.   Neurological: Negative.   Psychiatric/Behavioral: Positive for memory loss. Negative for depression, hallucinations, substance abuse and suicidal ideas. The patient is nervous/anxious and has insomnia.     Blood pressure (!) 154/80, pulse 70, temperature 98 F (36.7 C), temperature source Oral, resp. rate 16, height 5\' 3"  (1.6 m), weight 53 kg, SpO2 97 %.Body mass index is 20.7 kg/m.  General Appearance: Casual  Eye Contact:  Fair  Speech:  Pressured  Volume:  Increased  Mood:  Angry and Anxious  Affect:  Inappropriate and Labile  Thought Process:  Disorganized  Orientation:  Full (Time, Place, and Person)  Thought Content:  Illogical,  Rumination and Tangential  Suicidal Thoughts:  No  Homicidal Thoughts:  No  Memory:  Immediate;   Fair Recent;   Fair Remote;   Fair  Judgement:  Fair  Insight:  Fair  Psychomotor Activity:  Increased  Concentration:  Concentration: Fair  Recall:  [name]:  Fair  Language:  Fair  Akathisia:  No  Handed:  Right  AIMS (if indicated):     Assets:  Desire for Improvement  ADL's:  Impaired  Cognition:  Impaired,  Mild  Sleep:  Number of Hours: 3.5     Treatment Plan Summary: Daily contact with patient to assess and evaluate symptoms and progress in treatment, Medication management and Plan Patient continues to be labile day-to-day.  Personality disorder features frequently prominent.  She is showing some benefit from the ECT but does have some memory impairment.  I had to coach her through thinking about cooperating with discharge planning today.  Very hard to get her to agree to it but I think she will agree to be cooperative if approached correctly.  No change to medicine for today.  Alethia Berthold, MD 11/01/2017, 6:28 PM

## 2017-11-01 NOTE — BHH Counselor (Signed)
CSW met with the patient to discuss discharge plan and to have her sign a consent for the CSW to call [employer] to obtain information on her current benefits. Patient signed consent. CSW called [employer] and they report that the patients Medicaid had lasped and they previously went to reapply for the benefits. She reports that the process should be complete and provided the name of the Medicaid social worker Caressa Thaxton (([place] [employer]) (P;[phone], Fax-[phone])). She also reports that the patient is her own payee and she is unsure about the amount of money that they patient has. She reports that the patient never paid the full month, only paid half and told them that was all that she had.   CSW called the Medicaid social worker Caressa Thaxton to inquire about the patients benefits. CSW left a voicemail with a callback number.   Gloria Campbell with [facility] in [place] will be going on vacation and will be unavailable until early next week. She is willing to accept the patient into the home but will need all financial information on the patient to set up services.   , MSW, LCSWA, LCASA Clinical Social Worker 11/01/2017 3:15 PM    

## 2017-11-01 NOTE — Plan of Care (Signed)
Active in the milieu. Compliant with treatment

## 2017-11-01 NOTE — Progress Notes (Signed)
Patient angry, states she will not sign any consent for information for a group home. States Education officer, museum says patient will have to go to a shelter if she does not participate in housing options. Patient continues to say social work is not assisting her with housing. Offered much encouragement however patient refuses to sign any consent for information for assistance with group home placement. Clayborne Dana, RN

## 2017-11-01 NOTE — Progress Notes (Signed)
Recreation Therapy Notes   Date: 11/01/2017  Time: 9:30 am   Location: Craft room   Behavioral response: N/A   Intervention Topic: Problem Solving  Discussion/Intervention: Patient did not attend group.   Clinical Observations/Feedback:  Patient did not attend group.   Aayush Gelpi LRT/CTRS         Ivanna Kocak 11/01/2017 10:29 AM

## 2017-11-01 NOTE — Progress Notes (Signed)
Received Angela [name] this AM, she refused breakfast and continued to sleep. Later she continued to endorsed feeling sad and concerned about her transition to the group home next week. The TB test was placed omn her left forearm and marked. She received her AM medications at 1400 hrs and the second dose will be given by the late evening shift.

## 2017-11-01 NOTE — B[facility] Group Notes (Signed)
LCSW Group Therapy Note  11/01/2017 1:00 pm  Type of Therapy/Topic:  Group Therapy:  Balance in Life  Participation Level:  Did Not Attend  Description of Group:    This group will address the concept of balance and how it feels and looks when one is unbalanced. Patients will be encouraged to process areas in their lives that are out of balance and identify reasons for remaining unbalanced. Facilitators will guide patients in utilizing problem-solving interventions to address and correct the stressor making their life unbalanced. Understanding and applying boundaries will be explored and addressed for obtaining and maintaining a balanced life. Patients will be encouraged to explore ways to assertively make their unbalanced needs known to significant others in their lives, using other group members and facilitator for support and feedback.  Therapeutic Goals: 1. Patient will identify two or more emotions or situations they have that consume much of in their lives. 2. Patient will identify signs/triggers that life has become out of balance:  3. Patient will identify two ways to set boundaries in order to achieve balance in their lives:  4. Patient will demonstrate ability to communicate their needs through discussion and/or role plays  Summary of Patient Progress:      Therapeutic Modalities:   Cognitive Behavioral Therapy Solution-Focused Therapy Assertiveness Training  Devona Konig, LCSW 11/01/2017 3:06 PM

## 2017-11-02 ENCOUNTER — Encounter: Payer: Self-pay | Admitting: Certified Registered Nurse Anesthetist

## 2017-11-02 MED ORDER — DIPHENHYDRAMINE HCL 25 MG PO CAPS
25.0000 mg | ORAL_CAPSULE | Freq: Four times a day (QID) | ORAL | Status: DC | PRN
  Administered 2017-11-02 – 2017-11-03 (×3): 25 mg via ORAL
  Filled 2017-11-02 (×3): qty 1

## 2017-11-02 MED ORDER — QUETIAPINE FUMARATE 100 MG PO TABS
100.0000 mg | ORAL_TABLET | Freq: Every day | ORAL | Status: DC
  Administered 2017-11-02 – 2017-11-05 (×4): 100 mg via ORAL
  Filled 2017-11-02 (×5): qty 1

## 2017-11-02 MED ORDER — ARTIFICIAL TEARS OPHTHALMIC OINT
TOPICAL_OINTMENT | Freq: Three times a day (TID) | OPHTHALMIC | Status: DC
  Administered 2017-11-02 – 2017-11-05 (×8): via OPHTHALMIC
  Filled 2017-11-02: qty 3.5

## 2017-11-02 NOTE — Progress Notes (Signed)
[facility] MD Progress Note  11/02/2017 3:38 PM Angela [name]  MRN:  [id number] Subjective: Follow-up for this patient with chronic mood instability.  Patient declined ECT today which I think is just as well.  She is having some significant memory problems.  Seen later this afternoon she continues to have some irritability and mood swings pretty much at her baseline.  Patient's case was discussed with full treatment team.  We are all frustrated that the patient is refusing to allow anyone else to have access to her funds which makes it impossible to find appropriate placement for her.  She is not reporting any suicidal thoughts.  Not reporting any gross psychotic symptoms.  As usual she has multiple somatic complaints Principal Problem: Bipolar I disorder, most recent episode mixed, severe with psychotic features ([place]) Diagnosis:   Patient Active Problem List   Diagnosis Date Noted  . Bipolar I disorder, most recent episode mixed, severe with psychotic features ([place]) [F31.64] 09/18/2017  . Agitation [R45.1] 09/18/2017  . IBS (irritable bowel syndrome) [K58.9] 11/17/2016  . Bilateral carpal tunnel syndrome [G56.03] 11/17/2016  . Plantar fasciitis, bilateral [M72.2] 11/17/2016  . Chronic pain syndrome [G89.4] 11/17/2016  . Chronic hyponatremia [E87.1] 11/17/2016  . PTSD (post-traumatic stress disorder) [F43.10] 11/17/2016  . Hernia, hiatal [K44.9] 11/17/2016  . Vitamin D deficiency [E55.9] 11/17/2016  . Vitamin B12 deficiency [E53.8] 11/17/2016  . SIADH (syndrome of inappropriate ADH production) ([place]) [E22.2] 09/08/2016  . Cervical spondylosis with myelopathy [M47.12] 05/24/2016  . Post-concussion headache [G44.309] 05/24/2016  . MCI (mild cognitive impairment) with memory loss [G31.84] 03/30/2016  . Gait abnormality [R26.9] 03/30/2016  . Chronic bipolar affective disorder ([place]) [F31.9] 01/24/2016  . Anemia, iron deficiency [D50.9] 06/24/2015  . Endometriosis [N80.9] 06/24/2015  . Essential  hypertension [I10] 06/24/2015  . History of postmenopausal bleeding [Z87.42] 05/22/2015  . History of TIA (transient ischemic attack) [Z86.73] 04/20/2015  . Hypothyroidism [E03.9] 04/20/2015  . GERD (gastroesophageal reflux disease) [K21.9] 04/20/2015  . Cervical disc disorder with radiculopathy [M50.10] 03/25/2015  . Chronic neck pain [M54.2, G89.29] 03/25/2015  . Pelvic pain in female [R10.2] 10/30/2014  . History of skin cancer [Z85.828] 06/30/2014  . Chronic headaches [R51] 12/16/2013  . Neuropathy [G62.9] 12/16/2013   Total Time spent with patient: 30 minutes  Past Psychiatric History: Long history with multiple hospitalizations  Past Medical History:  Past Medical History:  Diagnosis Date  . Allergic rhinitis   . Anemia   . Blurred vision   . Depression   . Diverticulosis   . Endometriosis   . Falls   . GERD (gastroesophageal reflux disease)   . Hernia, hiatal   . Hypothyroidism   . IBS (irritable bowel syndrome)   . Malignant neoplasm of skin   . Migraine   . Neuropathy   . PTSD (post-traumatic stress disorder)   . Thyroid disease     Past Surgical History:  Procedure Laterality Date  . APPENDECTOMY    . CHOLECYSTECTOMY    . CHOLECYSTECTOMY, LAPAROSCOPIC    . ESOPHAGOGASTRODUODENOSCOPY (EGD) WITH PROPOFOL N/A 03/29/2015   Procedure: ESOPHAGOGASTRODUODENOSCOPY (EGD) WITH PROPOFOL;  Surgeon: Josefine Class, MD;  Location: [facility] ENDOSCOPY;  Service: Endoscopy;  Laterality: N/A;  . HERNIA REPAIR    . laproscopy    . TONSILLECTOMY    . uteral suspension     Family History:  Family History  Problem Relation Age of Onset  . Heart disease Father   . Cancer Mother        lung  .  Urolithiasis Neg Hx   . Kidney disease Neg Hx   . Kidney cancer Neg Hx   . Prostate cancer Neg Hx    Family Psychiatric  History: None seen previous note Social History:  Social History   Substance and Sexual Activity  Alcohol Use No     Social History   Substance and  Sexual Activity  Drug Use No    Social History   Socioeconomic History  . Marital status: Single    Spouse name: Not on file  . Number of children: 0  . Years of education: 1.5 years of college  . Highest education level: Not on file  Occupational History  . Occupation: Retired  Scientific laboratory technician  . Financial resource strain: Not on file  . Food insecurity:    Worry: Not on file    Inability: Not on file  . Transportation needs:    Medical: Not on file    Non-medical: Not on file  Tobacco Use  . Smoking status: Former Smoker    Last attempt to quit: 11/17/1976    Years since quitting: 40.9  . Smokeless tobacco: Never Used  Substance and Sexual Activity  . Alcohol use: No  . Drug use: No  . Sexual activity: Not Currently  Lifestyle  . Physical activity:    Days per week: Not on file    Minutes per session: Not on file  . Stress: Not on file  Relationships  . Social connections:    Talks on phone: Not on file    Gets together: Not on file    Attends religious service: Not on file    Active member of club or organization: Not on file    Attends meetings of clubs or organizations: Not on file    Relationship status: Not on file  Other Topics Concern  . Not on file  Social History Narrative   Lives at home alone.   Right-handed.   No daily caffeine use.   Additional Social History:    Pain Medications: See PTA Prescriptions: See PTA Over the Counter: See PTA History of alcohol / drug use?: No history of alcohol / drug abuse Longest period of sobriety (when/how long): Reports of none Negative Consequences of Use: (n/a) Withdrawal Symptoms: (n/a)                    Sleep: Fair  Appetite:  Fair  Current Medications: Current Facility-Administered Medications  Medication Dose Route Frequency Provider Last Rate Last Dose  . acetaminophen (TYLENOL) tablet 650 mg  650 mg Oral Q6H PRN Kamayah Pillay, Madie Reno, MD   650 mg at 11/01/17 1357  . alum & mag hydroxide-simeth  (MAALOX/MYLANTA) 200-200-20 MG/5ML suspension 30 mL  30 mL Oral Q4H PRN Samari Bittinger T, MD   30 mL at 10/08/17 1507  . amLODipine (NORVASC) tablet 5 mg  5 mg Oral BID Concettina Leth, Madie Reno, MD   5 mg at 11/02/17 0941  . artificial tears (LACRILUBE) ophthalmic ointment   Both Eyes Q8H Charnee Turnipseed T, MD      . calcium carbonate (TUMS - dosed in mg elemental calcium) chewable tablet 400 mg of elemental calcium  2 tablet Oral Q breakfast Pucilowska, Jolanta B, MD   400 mg of elemental calcium at 11/02/17 0943  . cholecalciferol (VITAMIN D) tablet 1,000 Units  1,000 Units Oral Daily Pucilowska, Jolanta B, MD   1,000 Units at 11/02/17 0940  . cycloSPORINE (RESTASIS) 0.05 % ophthalmic emulsion 1 drop  1 drop Both Eyes BID Maxxwell Edgett, Madie Reno, MD   1 drop at 11/02/17 0939  . diclofenac sodium (VOLTAREN) 1 % transdermal gel 4 g  4 g Topical QID Pucilowska, Jolanta B, MD   4 g at 11/02/17 0945  . diphenhydrAMINE (BENADRYL) capsule 25 mg  25 mg Oral Q6H PRN Deshia Vanderhoof T, MD      . feeding supplement (BOOST / RESOURCE BREEZE) liquid 1 Container  1 Container Oral TID BM Maebell Lyvers, Madie Reno, MD   1 Container at 11/02/17 1436  . fentaNYL (SUBLIMAZE) injection 25 mcg  25 mcg Intravenous Q5 min PRN Alvin Critchley, MD      . ferrous sulfate tablet 325 mg  325 mg Oral Q breakfast Pucilowska, Jolanta B, MD   325 mg at 11/02/17 0940  . gabapentin (NEURONTIN) capsule 100 mg  100 mg Oral BID Crystalyn Delia, Madie Reno, MD   100 mg at 11/02/17 0946  . glycopyrrolate (ROBINUL) injection 0.1 mg  0.1 mg Intravenous Once Giomar Gusler T, MD      . hydrocortisone cream 1 %   Topical BID Kariann Wecker T, MD      . ipratropium (ATROVENT) 0.03 % nasal spray 2 spray  2 spray Each Nare TID He, Jun, MD   2 spray at 11/02/17 1248  . levalbuterol (XOPENEX) nebulizer solution 0.63 mg  0.63 mg Nebulization Q1400 Aliscia Clayton, Madie Reno, MD   0.63 mg at 11/02/17 1301  . levothyroxine (SYNTHROID, LEVOTHROID) tablet 100 mcg  100 mcg Oral QAC breakfast Pucilowska,  Jolanta B, MD   100 mcg at 11/02/17 0854  . lidocaine (LIDODERM) 5 % 1 patch  1 patch Transdermal Q24H Pucilowska, Jolanta B, MD   1 patch at 11/02/17 1429  . linaclotide (LINZESS) capsule 290 mcg  290 mcg Oral QAC breakfast Pucilowska, Jolanta B, MD   290 mcg at 11/02/17 0854  . lip balm (BLISTEX) ointment   Topical PRN Nyazia Canevari T, MD      . loperamide (IMODIUM) capsule 2 mg  2 mg Oral PRN Pucilowska, Jolanta B, MD   2 mg at 10/18/17 0002  . LORazepam (ATIVAN) tablet 0.5 mg  0.5 mg Oral Q6H PRN Glenard Keesling, Madie Reno, MD   0.5 mg at 10/30/17 0033  . magnesium hydroxide (MILK OF MAGNESIA) suspension 30 mL  30 mL Oral Daily PRN Taila Basinski, Madie Reno, MD   30 mL at 10/05/17 1731  . magnesium oxide (MAG-OX) tablet 200 mg  200 mg Oral BID Pucilowska, Jolanta B, MD   200 mg at 11/02/17 0942  . midazolam (VERSED) injection 2 mg  2 mg Intravenous Once Alexzandra Bilton T, MD      . multivitamin with minerals tablet 1 tablet  1 tablet Oral Daily Pucilowska, Jolanta B, MD   1 tablet at 11/02/17 0940  . omega-3 acid ethyl esters (LOVAZA) capsule 1 g  1 g Oral BID Pucilowska, Jolanta B, MD   1 g at 11/02/17 0944  . ondansetron (ZOFRAN) injection 4 mg  4 mg Intravenous Once Moncerrat Burnstein T, MD      . ondansetron [facility]) injection 4 mg  4 mg Intravenous Once PRN Gunnar Fusi, MD      . ondansetron [facility]) injection 4 mg  4 mg Intravenous Once PRN Gunnar Bulla, MD      . ondansetron [facility]) injection 4 mg  4 mg Intravenous Once PRN Alvin Critchley, MD      . ondansetron [facility]) injection 4 mg  4 mg Intravenous  Once Damarco Keysor, Madie Reno, MD      . ondansetron [facility]) injection 4 mg  4 mg Intravenous Once PRN Alvin Critchley, MD      . ondansetron (ZOFRAN-ODT) disintegrating tablet 4 mg  4 mg Oral Q8H PRN Pucilowska, Jolanta B, MD   4 mg at 10/08/17 1807  . PARoxetine (PAXIL-CR) 24 hr tablet 25 mg  25 mg Oral Daily Lavon Bothwell, Madie Reno, MD   25 mg at 11/02/17 0944  . polyethylene glycol (MIRALAX / GLYCOLAX) packet 17 g  17 g  Oral Daily Pucilowska, Jolanta B, MD   17 g at 11/02/17 0942  . polyvinyl alcohol (LIQUIFILM TEARS) 1.4 % ophthalmic solution 1 drop  1 drop Both Eyes PRN Ramond Dial, MD   1 drop at 11/02/17 0107  . prazosin (MINIPRESS) capsule 2 mg  2 mg Oral QHS Malay Fantroy, Madie Reno, MD   2 mg at 11/01/17 2146  . promethazine (PHENERGAN) tablet 12.5 mg  12.5 mg Oral Q6H PRN Reeve Turnley T, MD   12.5 mg at 10/24/17 1234  . pyridOXINE (VITAMIN B-6) tablet 100 mg  100 mg Oral BID Devann Cribb, Madie Reno, MD   100 mg at 11/02/17 0945  . QUEtiapine (SEROQUEL) tablet 100 mg  100 mg Oral QHS Vi Biddinger T, MD      . tiZANidine (ZANAFLEX) tablet 2 mg  2 mg Oral Q8H PRN Pucilowska, Jolanta B, MD   2 mg at 11/01/17 2252  . traZODone (DESYREL) tablet 100 mg  100 mg Oral QHS Levita Monical, Madie Reno, MD   100 mg at 11/01/17 2146  . triamcinolone cream (KENALOG) 0.1 %   Topical TID PRN Ramond Dial, MD   1 application at 60/63/01 2106  . tuberculin injection 5 Units  5 Units Intradermal Once Pucilowska, Jolanta B, MD   5 Units at 11/01/17 1404  . vitamin C (ASCORBIC ACID) tablet 1,000 mg  1,000 mg Oral Daily Pucilowska, Jolanta B, MD   1,000 mg at 11/02/17 0948    Lab Results: No results found for this or any previous visit (from the past 48 hour(s)).  Blood Alcohol level:  Lab Results  Component Value Date   ETH <5 09/29/2016   ETH <5 60/10/9321    Metabolic Disorder Labs: Lab Results  Component Value Date   HGBA1C 5.7 (H) 09/20/2017   MPG 116.89 09/20/2017   No results found for: PROLACTIN Lab Results  Component Value Date   CHOL 173 09/20/2017   TRIG 193 (H) 09/20/2017   HDL 50 09/20/2017   CHOLHDL 3.5 09/20/2017   VLDL 39 09/20/2017   LDLCALC 84 09/20/2017   LDLCALC 87 09/04/2017    Physical Findings: AIMS: Facial and Oral Movements Muscles of Facial Expression: None, normal Lips and Perioral Area: None, normal Jaw: None, normal Tongue: None, normal,Extremity Movements Upper (arms, wrists, hands,  fingers): None, normal Lower (legs, knees, ankles, toes): None, normal, Trunk Movements Neck, shoulders, hips: None, normal, Overall Severity Severity of abnormal movements (highest score from questions above): None, normal Incapacitation due to abnormal movements: None, normal Patient's awareness of abnormal movements (rate only patient's report): No Awareness, Dental Status Current problems with teeth and/or dentures?: No Does patient usually wear dentures?: No  CIWA:    COWS:     Musculoskeletal: Strength & Muscle Tone: within normal limits Gait & Station: normal Patient leans: N/A  Psychiatric Specialty Exam: Physical Exam  Nursing note and vitals reviewed. Constitutional: She appears well-developed and well-nourished.  HENT:  Head: Normocephalic and atraumatic.  Eyes: Pupils are equal, round, and reactive to light. Conjunctivae are normal.  Neck: Normal range of motion.  Cardiovascular: Regular rhythm and normal heart sounds.  Respiratory: Effort normal. No respiratory distress.  GI: Soft.  Musculoskeletal: Normal range of motion.  Neurological: She is alert.  Skin: Skin is warm and dry.  Psychiatric: Her affect is labile. Her speech is tangential. She is agitated. She is not aggressive. Thought content is paranoid. Cognition and memory are impaired. She expresses impulsivity. She expresses no homicidal and no suicidal ideation.    Review of Systems  Constitutional: Negative.   HENT: Negative.   Eyes: Negative.   Respiratory: Negative.   Cardiovascular: Negative.   Gastrointestinal: Negative.   Musculoskeletal: Negative.   Skin: Negative.   Neurological: Negative.   Psychiatric/Behavioral: Negative.     Blood pressure 115/72, pulse 69, temperature 98.2 F (36.8 C), temperature source Oral, resp. rate 16, height 5\' 3"  (1.6 m), weight 53 kg, SpO2 98 %.Body mass index is 20.7 kg/m.  General Appearance: Casual  Eye Contact:  Fair  Speech:  Clear and Coherent   Volume:  Increased  Mood:  Anxious  Affect:  Labile  Thought Process:  Disorganized  Orientation:  Full (Time, Place, and Person)  Thought Content:  Logical, Rumination and Tangential  Suicidal Thoughts:  No  Homicidal Thoughts:  No  Memory:  Immediate;   Fair Recent;   Fair Remote;   Fair  Judgement:  Impaired  Insight:  Lacking  Psychomotor Activity:  Decreased  Concentration:  Concentration: Fair  Recall:  [name]:  Fair  Language:  Fair  Akathisia:  No  Handed:  Right  AIMS (if indicated):     Assets:  Desire for Improvement  ADL's:  Intact  Cognition:  Impaired,  Mild  Sleep:  Number of Hours: 3.5     Treatment Plan Summary: Daily contact with patient to assess and evaluate symptoms and progress in treatment, Medication management and Plan I did make one piece of progress today.  I got in touch with the ear nose and throat doctor at [facility] and have made an appointment for her to be seen again in preparation for tympanoplasty.  She has an appointment at [facility] in clinic 1 I on November 4 at 9:00 in the morning for a hearing test followed by an appointment with the doctor at 945.  I have recommended to the patient that probably the best thing she could do at this point would be to use her own money to take a bus to wherever she wants to live and get herself her room.  We are going to aim for trying to make this happen by early next week.  Alethia Berthold, MD 11/02/2017, 3:38 PM

## 2017-11-02 NOTE — [facility] Group Notes (Signed)
[facility] Group Notes:  (Nursing/MHT/Case Management/Adjunct)  Date:  11/02/2017  Time:  9:24 PM  Type of Therapy:  Group Therapy  Participation Level:  Active  Participation Quality:  Inattentive  Affect:  Excited  Cognitive:  Alert  Insight:  Good  Engagement in Group:  Engaged  Modes of Intervention:  Support  Summary of Progress/Problems:  Angela [name] 11/02/2017, 9:24 PM

## 2017-11-02 NOTE — Plan of Care (Signed)
Patient was up for breakfast this morning.Comes to nurses multiple times for one thing or another.Patient appreciated Dr.Clapacs for calling the ear doctor at [facility].At this time patient states that she need Cortisone shots for her ankle swelling and pain.Informed patient that it is not going to happen today.Offered  pain medicine but patient refused it.Patient stated that she want to hear from [place].Dr.Clapacs paged.

## 2017-11-02 NOTE — Progress Notes (Signed)
Patient ID: Angela [name], female   DOB: Jan 19, 1950, 67 y.o.   MRN: [id number] CSW met with pt to continue discharge planning.  CSW was informed by pt that she would go to [facility] in [place], [place].  [place] attempted to contact the owner, Dayton Martes, to allow pt to give her the financial information that she needed.  CSW spoke with the house manager and was informed that Ms. Megan Salon is on vacation, but she would text her and ask if she could give CSW Darin Engels a call who will facilitate the conversation with pt on Monday, October 21.   CSW also contacted four low income apartments ([place], [place]) and allowed pt to talk with representatives about costs, waitlists, and how she could complete the application process.  Pt was unable to get in touch with anyone at the [facility].  Pt also contacted [facility] and left a VM with the Director. Pt attempted to contact the guardianship agency citing that she would like them to know that she is in the hospital. CSW contacted [facility] to inquire about open beds in their five boarding homes.  CSW was informed that pt would need to come into the office before 3pm to complete an application.  Pt informed CSW that she really want to live in a [employer].  CSW informed pt that she was unaware of any Co-op Community in the [place] area, but this could be researched.  Pt shared with CSW that she is feeling rushed and "bulled" about being discharged from the [facility] and that she would like to be able to go live at a place that she is going to feel safe and comfortable.  She stated that all of this is making her have more anxiety.  CSW validated the pt's feelings and concerns and discussed with her the fact that she is stable and need to d/c from the hospital, but we want her to be in a safe environment.  CSW informed pt that she will be safe and supported in [facility] especially if she is  planning to have ear surgery.   CSW informed pt that she would ask CSW Darin Engels to meet with her on Monday, Oct 21 to continue discharge planning with her.

## 2017-11-02 NOTE — Progress Notes (Signed)
Patient  Stayed in the milieu, frequently coming to the nurses station, reporting that "no one cares about me, no one wants to talk to me. I don't have any friends". Patient reported that she is not happy about  Her upcoming placement and would like to discuss more with MD and case management. Remained needy and seeking attention. Was redirected  Frequently. Patient stayed in room for a while, manic and anxious. Currently in bed sleeping. Safety precautions maintained.

## 2017-11-02 NOTE — Plan of Care (Signed)
Activive in the milieu. Expressing anxiety related to upcoming discharge

## 2017-11-02 NOTE — Progress Notes (Signed)
Recreation Therapy Notes  Date: 11/02/2017  Time: 9:30 am   Location: Craft room   Behavioral response: N/A   Intervention Topic: Leisure   Discussion/Intervention: Patient did not attend group.   Clinical Observations/Feedback:  Patient did not attend group.   Carra Brindley LRT/CTRS        Heddy Vidana 11/02/2017 10:53 AM

## 2017-11-02 NOTE — Tx Team (Signed)
Interdisciplinary Treatment and Diagnostic Plan Update  10/15/2017 Time of Session: 10:30am Angela [name] MRN: [id number]  Principal Diagnosis: Bipolar I disorder, most recent episode mixed, severe with psychotic features ([place])  Secondary Diagnoses: Principal Problem:   Bipolar I disorder, most recent episode mixed, severe with psychotic features ([place]) Active Problems:   Hypothyroidism   Essential hypertension   Plantar fasciitis, bilateral   PTSD (post-traumatic stress disorder)   Agitation   Current Medications:  Current Facility-Administered Medications  Medication Dose Route Frequency Provider Last Rate Last Dose  . acetaminophen (TYLENOL) tablet 650 mg  650 mg Oral Q6H PRN Clapacs, Madie Reno, MD   650 mg at 11/01/17 1357  . alum & mag hydroxide-simeth (MAALOX/MYLANTA) 200-200-20 MG/5ML suspension 30 mL  30 mL Oral Q4H PRN Clapacs, John T, MD   30 mL at 10/08/17 1507  . amLODipine (NORVASC) tablet 5 mg  5 mg Oral BID Clapacs, Madie Reno, MD   5 mg at 11/02/17 0941  . calcium carbonate (TUMS - dosed in mg elemental calcium) chewable tablet 400 mg of elemental calcium  2 tablet Oral Q breakfast Pucilowska, Jolanta B, MD   400 mg of elemental calcium at 11/02/17 0943  . cholecalciferol (VITAMIN D) tablet 1,000 Units  1,000 Units Oral Daily Pucilowska, Jolanta B, MD   1,000 Units at 11/02/17 0940  . cycloSPORINE (RESTASIS) 0.05 % ophthalmic emulsion 1 drop  1 drop Both Eyes BID Clapacs, Madie Reno, MD   1 drop at 11/02/17 0939  . diclofenac sodium (VOLTAREN) 1 % transdermal gel 4 g  4 g Topical QID Pucilowska, Jolanta B, MD   4 g at 11/02/17 0945  . feeding supplement (BOOST / RESOURCE BREEZE) liquid 1 Container  1 Container Oral TID BM Clapacs, Madie Reno, MD   1 Container at 11/01/17 2148  . fentaNYL (SUBLIMAZE) injection 25 mcg  25 mcg Intravenous Q5 min PRN Alvin Critchley, MD      . ferrous sulfate tablet 325 mg  325 mg Oral Q breakfast Pucilowska, Jolanta B, MD   325 mg at 11/02/17 0940  .  gabapentin (NEURONTIN) capsule 100 mg  100 mg Oral BID Clapacs, Madie Reno, MD   100 mg at 11/02/17 0946  . glycopyrrolate (ROBINUL) injection 0.1 mg  0.1 mg Intravenous Once Clapacs, John T, MD      . hydrocortisone cream 1 %   Topical BID Clapacs, John T, MD      . ipratropium (ATROVENT) 0.03 % nasal spray 2 spray  2 spray Each Nare TID He, Jun, MD   2 spray at 11/02/17 0938  . levalbuterol (XOPENEX) nebulizer solution 0.63 mg  0.63 mg Nebulization Q1400 Clapacs, Madie Reno, MD   0.63 mg at 11/01/17 1443  . levothyroxine (SYNTHROID, LEVOTHROID) tablet 100 mcg  100 mcg Oral QAC breakfast Pucilowska, Jolanta B, MD   100 mcg at 11/02/17 0854  . lidocaine (LIDODERM) 5 % 1 patch  1 patch Transdermal Q24H Pucilowska, Jolanta B, MD   1 patch at 10/30/17 0926  . linaclotide (LINZESS) capsule 290 mcg  290 mcg Oral QAC breakfast Pucilowska, Jolanta B, MD   290 mcg at 11/02/17 0854  . lip balm (BLISTEX) ointment   Topical PRN Clapacs, John T, MD      . loperamide (IMODIUM) capsule 2 mg  2 mg Oral PRN Pucilowska, Jolanta B, MD   2 mg at 10/18/17 0002  . LORazepam (ATIVAN) tablet 0.5 mg  0.5 mg Oral Q6H PRN Clapacs, Madie Reno, MD  0.5 mg at 10/30/17 0033  . magnesium hydroxide (MILK OF MAGNESIA) suspension 30 mL  30 mL Oral Daily PRN Clapacs, Madie Reno, MD   30 mL at 10/05/17 1731  . magnesium oxide (MAG-OX) tablet 200 mg  200 mg Oral BID Pucilowska, Jolanta B, MD   200 mg at 11/02/17 0942  . midazolam (VERSED) injection 2 mg  2 mg Intravenous Once Clapacs, John T, MD      . multivitamin with minerals tablet 1 tablet  1 tablet Oral Daily Pucilowska, Jolanta B, MD   1 tablet at 11/02/17 0940  . omega-3 acid ethyl esters (LOVAZA) capsule 1 g  1 g Oral BID Pucilowska, Jolanta B, MD   1 g at 11/02/17 0944  . ondansetron (ZOFRAN) injection 4 mg  4 mg Intravenous Once Clapacs, John T, MD      . ondansetron [facility]) injection 4 mg  4 mg Intravenous Once PRN Gunnar Fusi, MD      . ondansetron [facility]) injection 4 mg  4 mg  Intravenous Once PRN Gunnar Bulla, MD      . ondansetron [facility] injection 4 mg  4 mg Intravenous Once PRN Alvin Critchley, MD      . ondansetron [facility]) injection 4 mg  4 mg Intravenous Once Clapacs, John T, MD      . ondansetron [facility]) injection 4 mg  4 mg Intravenous Once PRN Alvin Critchley, MD      . ondansetron (ZOFRAN-ODT) disintegrating tablet 4 mg  4 mg Oral Q8H PRN Pucilowska, Jolanta B, MD   4 mg at 10/08/17 1807  . PARoxetine (PAXIL-CR) 24 hr tablet 25 mg  25 mg Oral Daily Clapacs, Madie Reno, MD   25 mg at 11/02/17 0944  . polyethylene glycol (MIRALAX / GLYCOLAX) packet 17 g  17 g Oral Daily Pucilowska, Jolanta B, MD   17 g at 11/02/17 0942  . polyvinyl alcohol (LIQUIFILM TEARS) 1.4 % ophthalmic solution 1 drop  1 drop Both Eyes PRN Ramond Dial, MD   1 drop at 11/02/17 0107  . prazosin (MINIPRESS) capsule 2 mg  2 mg Oral QHS Clapacs, Madie Reno, MD   2 mg at 11/01/17 2146  . promethazine (PHENERGAN) tablet 12.5 mg  12.5 mg Oral Q6H PRN Clapacs, John T, MD   12.5 mg at 10/24/17 1234  . pyridOXINE (VITAMIN B-6) tablet 100 mg  100 mg Oral BID Clapacs, Madie Reno, MD   100 mg at 11/02/17 0945  . tiZANidine (ZANAFLEX) tablet 2 mg  2 mg Oral Q8H PRN Pucilowska, Jolanta B, MD   2 mg at 11/01/17 2252  . traZODone (DESYREL) tablet 100 mg  100 mg Oral QHS Clapacs, Madie Reno, MD   100 mg at 11/01/17 2146  . triamcinolone cream (KENALOG) 0.1 %   Topical TID PRN Ramond Dial, MD   1 application at 32/20/25 2106  . tuberculin injection 5 Units  5 Units Intradermal Once Pucilowska, Jolanta B, MD   5 Units at 11/01/17 1404  . vitamin C (ASCORBIC ACID) tablet 1,000 mg  1,000 mg Oral Daily Pucilowska, Jolanta B, MD   1,000 mg at 11/02/17 0948   PTA Medications: Medications Prior to Admission  Medication Sig Dispense Refill Last Dose  . amLODipine (NORVASC) 5 MG tablet Take 1 tablet (5 mg total) by mouth 2 (two) times daily. 180 tablet 1 10/28/2017 at Unknown time  . ANORO ELLIPTA 62.5-25 MCG/INH AEPB  Inhale 1 puff into the lungs daily. 14 each 0 10/28/2017 at  Unknown time  . calcium carbonate (OS-CAL) 600 MG TABS tablet Take 1 tablet (600 mg total) by mouth 2 (two) times daily with a meal. (Patient taking differently: Take 600 mg 3 (three) times daily by mouth. ) 60 tablet 0 10/28/2017 at Unknown time  . cholecalciferol (VITAMIN D) 1000 units tablet Take 4,000 Units by mouth daily.   10/28/2017 at Unknown time  . citalopram (CELEXA) 20 MG tablet Take 20 mg by mouth daily.  0 10/28/2017 at Unknown time  . conjugated estrogens (PREMARIN) vaginal cream Place vaginally 3 (three) times a week. Use 1/2 gram 3-4 times weekly. 42.5 g 4 10/28/2017 at Unknown time  . cycloSPORINE (RESTASIS) 0.05 % ophthalmic emulsion Place 1 drop into both eyes 2 (two) times daily. 60 each 5 10/28/2017 at Unknown time  . diclofenac sodium (VOLTAREN) 1 % GEL Apply 2 g topically 4 (four) times daily.   10/28/2017 at Unknown time  . ferrous sulfate (FERROUSUL) 325 (65 FE) MG tablet Take 1 tablet (325 mg total) by mouth daily with breakfast. 90 tablet 1 10/28/2017 at Unknown time  . gabapentin (NEURONTIN) 100 MG capsule Take 1-2 capsules (100-200 mg total) by mouth 3 (three) times daily. (Patient taking differently: Take 100 mg by mouth 2 (two) times daily. ) 540 capsule 1 10/28/2017 at Unknown time  . guaiFENesin (MUCINEX) 600 MG 12 hr tablet Take 1 tablet (600 mg total) by mouth 2 (two) times daily as needed for cough. (Patient taking differently: Take 600 mg by mouth daily. ) 30 tablet 0 10/28/2017 at Unknown time  . hydrocortisone (PROCTOZONE-HC) 2.5 % rectal cream Place 1 application rectally 2 (two) times daily. 30 g 1 10/28/2017 at Unknown time  . hydrOXYzine (ATARAX/VISTARIL) 10 MG tablet Take 1 tablet by mouth 2 (two) times daily as needed for anxiety.    10/28/2017 at Unknown time  . KRILL OIL OMEGA-3 PO Take 350 mg by mouth daily.   10/28/2017 at Unknown time  . levalbuterol (XOPENEX) 1.25 MG/3ML nebulizer solution  Inhale 3 mLs into the lungs daily.   10/28/2017 at Unknown time  . levothyroxine (SYNTHROID, LEVOTHROID) 100 MCG tablet Take 1 tablet (100 mcg total) by mouth daily before breakfast. 90 tablet 1 10/28/2017 at Unknown time  . linaclotide (LINZESS) 290 MCG CAPS capsule Take 1 capsule (290 mcg total) by mouth daily before breakfast. 90 capsule 1 10/28/2017 at Unknown time  . magnesium oxide (MAG-OX) 400 MG tablet Take 400 mg by mouth daily with breakfast.   10/28/2017 at Unknown time  . montelukast (SINGULAIR) 10 MG tablet Take 1 tablet by mouth at bedtime.  11 10/28/2017 at Unknown time  . omeprazole (PRILOSEC) 20 MG capsule Take 1 capsule by mouth daily.   10/28/2017 at Unknown time  . Polyethyl Glycol-Propyl Glycol (SYSTANE OP) Place 1 drop into both eyes daily as needed (dry eyes).   10/28/2017 at Unknown time  . polyethylene glycol powder (GLYCOLAX/MIRALAX) powder Take 17-34 g by mouth daily. 1700 g 5 10/28/2017 at Unknown time  . Probiotic Product (PROBIOTIC PO) Take 1 tablet by mouth daily.   10/28/2017 at Unknown time  . promethazine (PHENERGAN) 25 MG tablet Take 0.5 tablets (12.5 mg total) by mouth every 6 (six) hours as needed for nausea or vomiting. 30 tablet 0 10/28/2017 at Unknown time  . Pyridoxine HCl (VITAMIN B-6) 500 MG tablet Take 1 tablet by mouth 2 (two) times daily.   10/28/2017 at Unknown time  . ranitidine (ZANTAC) 150 MG tablet Take 150 mg by  mouth daily before supper.   10/28/2017 at Unknown time  . tiZANidine (ZANAFLEX) 2 MG tablet Take 0.5-1 tablets (1-2 mg total) by mouth 2 (two) times daily as needed for muscle spasms. 180 tablet 1 10/28/2017 at Unknown time  . tolterodine (DETROL LA) 2 MG 24 hr capsule Take 1 capsule (2 mg total) daily by mouth. 30 capsule 11 10/28/2017 at Unknown time  . traZODone (DESYREL) 50 MG tablet Take 50 mg by mouth at bedtime.   10/28/2017 at Unknown time    Patient Stressors: Financial difficulties Health problems Traumatic event Other: "Pain"  "fear" "trauma" "homeless"  Patient Strengths: Ability for insight Communication skills General fund of knowledge Motivation for treatment/growth  Treatment Modalities: Medication Management, Group therapy, Case management,  1 to 1 session with clinician, Psychoeducation, Recreational therapy.   Physician Treatment Plan for Primary Diagnosis: Bipolar I disorder, most recent episode mixed, severe with psychotic features ([place]) Long Term Goal(s): Improvement in symptoms so as ready for discharge Improvement in symptoms so as ready for discharge   Short Term Goals: Ability to identify changes in lifestyle to reduce recurrence of condition will improve Ability to verbalize feelings will improve Ability to disclose and discuss suicidal ideas Ability to demonstrate self-control will improve Ability to identify and develop effective coping behaviors will improve Ability to maintain clinical measurements within normal limits will improve Compliance with prescribed medications will improve Ability to identify triggers associated with substance abuse/mental health issues will improve NA  Medication Management: Evaluate patient's response, side effects, and tolerance of medication regimen.  Therapeutic Interventions: 1 to 1 sessions, Unit Group sessions and Medication administration.  Evaluation of Outcomes: Progressing  Physician Treatment Plan for Secondary Diagnosis: Principal Problem:   Bipolar I disorder, most recent episode mixed, severe with psychotic features ([place]) Active Problems:   Hypothyroidism   Essential hypertension   Plantar fasciitis, bilateral   PTSD (post-traumatic stress disorder)   Agitation  Long Term Goal(s): Improvement in symptoms so as ready for discharge Improvement in symptoms so as ready for discharge   Short Term Goals: Ability to identify changes in lifestyle to reduce recurrence of condition will improve Ability to verbalize feelings will  improve Ability to disclose and discuss suicidal ideas Ability to demonstrate self-control will improve Ability to identify and develop effective coping behaviors will improve Ability to maintain clinical measurements within normal limits will improve Compliance with prescribed medications will improve Ability to identify triggers associated with substance abuse/mental health issues will improve NA     Medication Management: Evaluate patient's response, side effects, and tolerance of medication regimen.  Therapeutic Interventions: 1 to 1 sessions, Unit Group sessions and Medication administration.  Evaluation of Outcomes: Not Progressing   RN Treatment Plan for Primary Diagnosis: Bipolar I disorder, most recent episode mixed, severe with psychotic features ([place]) Long Term Goal(s): Knowledge of disease and therapeutic regimen to maintain health will improve  Short Term Goals: Ability to identify and develop effective coping behaviors will improve and Compliance with prescribed medications will improve  Medication Management: RN will administer medications as ordered by provider, will assess and evaluate patient's response and provide education to patient for prescribed medication. RN will report any adverse and/or side effects to prescribing provider.  Therapeutic Interventions: 1 on 1 counseling sessions, Psychoeducation, Medication administration, Evaluate responses to treatment, Monitor vital signs and CBGs as ordered, Perform/monitor CIWA, COWS, AIMS and Fall Risk screenings as ordered, Perform wound care treatments as ordered.  Evaluation of Outcomes: Progressing   LCSW Treatment  Plan for Primary Diagnosis: Bipolar I disorder, most recent episode mixed, severe with psychotic features ([place]) Long Term Goal(s): Safe transition to appropriate next level of care at discharge, Engage patient in therapeutic group addressing interpersonal concerns.  Short Term Goals: Engage patient in  aftercare planning with referrals and resources, Increase social support and Increase skills for wellness and recovery  Therapeutic Interventions: Assess for all discharge needs, 1 to 1 time with Social worker, Explore available resources and support systems, Assess for adequacy in community support network, Educate family and significant other(s) on suicide prevention, Complete Psychosocial Assessment, Interpersonal group therapy.  Evaluation of Outcomes: Progressing   Progress in Treatment: Attending groups: As evidenced by:  Pt is attending some groups. Participating in groups: As evidenced by:  Pt is participating some, but having to be re-directed quite often during group. Taking medication as prescribed: Yes. Toleration medication: Yes. Family/Significant other contact made: No, will contact:  pt declined consent Patient understands diagnosis: Yes. Discussing patient identified problems/goals with staff: Yes. Medical problems stabilized or resolved: Yes. Denies suicidal/homicidal ideation: Yes. Issues/concerns per patient self-inventory: No. Other: none  New problem(s) identified: No, Describe:  none  New Short Term/Long Term Goal(s):  Patient Goals: Currently receiving ECT treatment;  D/C to a rehab facility and/or [facility] in [place], [place].  Discharge Plan or Barriers: TBD.  Pt is making some progress with ECT, but still quite irritable, demanding, with labile mood.  CSW has contacted several mental health facilities/grouphomes and have identified on in [place], [place] with a open bed ([place]). Pt has expressed a desire to have some time to think about going to this placement. Several low-income apartments have also been contacted with long waitlists.    Reason for Continuation of Hospitalization: Depression, irritability, disorganization, mood lability, refusing medications at times. Pt needs to continue ECT.  On-going efforts to secure appropriate d/c placement.  Estimated  Length of Stay: 5-7 days.  Attendees: Patient:   Physician: Alethia Berthold, MD  11/02/2017  Nursing:   RN Care Manager:   Social Worker: Derrek Gu, LCSW 11/02/2017  Recreational Therapist: Roanna Epley, LRT 11/02/2017  Other: Darin Engels, [place] 11/02/2017  Other:    Other:     Scribe for Treatment Team: Devona Konig, LCSW 11/02/2017 12:38 PM

## 2017-11-02 NOTE — [facility] Group Notes (Signed)
Feelings Around Relapse 11/02/2017 1PM  Type of Therapy and Topic:  Group Therapy:  Feelings around Relapse and Recovery  Participation Level:  Did Not Attend   Description of Group:    Patients in this group will discuss emotions they experience before and after a relapse. They will process how experiencing these feelings, or avoidance of experiencing them, relates to having a relapse. Facilitator will guide patients to explore emotions they have related to recovery. Patients will be encouraged to process which emotions are more powerful. They will be guided to discuss the emotional reaction significant others in their lives may have to patients' relapse or recovery. Patients will be assisted in exploring ways to respond to the emotions of others without this contributing to a relapse.  Therapeutic Goals: 1. Patient will identify two or more emotions that lead to a relapse for them 2. Patient will identify two emotions that result when they relapse 3. Patient will identify two emotions related to recovery 4. Patient will demonstrate ability to communicate their needs through discussion and/or role plays   Summary of Patient Progress: Patient was encouraged and invited to attend group. Patient did not attend group. Social worker will continue to encourage group participation in the future.      Therapeutic Modalities:   Cognitive Behavioral Therapy Solution-Focused Therapy Assertiveness Training Relapse Prevention Therapy   Yvette Rack, LCSW 11/02/2017 2:56 PM

## 2017-11-03 NOTE — Plan of Care (Signed)
Anxious in relation to the upcoming placement. Otherwise cooperative.

## 2017-11-03 NOTE — Plan of Care (Signed)
Anxious  about upcoming discharge but cooperative.

## 2017-11-03 NOTE — Progress Notes (Signed)
[facility] MD Progress Note  11/03/2017 12:57 PM Angela [name]  MRN:  [id number] Subjective: Follow-up for this patient with chronic mood instability.  Patient declining  ECT yesterday   Pt labile, irritable, somatic, reports nightmares.  She is not reporting any suicidal thoughts.  Denies AVH.   Principal Problem: Bipolar I disorder, most recent episode mixed, severe with psychotic features ([place]) Diagnosis:   Patient Active Problem List   Diagnosis Date Noted  . Bipolar I disorder, most recent episode mixed, severe with psychotic features ([place]) [F31.64] 09/18/2017  . Agitation [R45.1] 09/18/2017  . IBS (irritable bowel syndrome) [K58.9] 11/17/2016  . Bilateral carpal tunnel syndrome [G56.03] 11/17/2016  . Plantar fasciitis, bilateral [M72.2] 11/17/2016  . Chronic pain syndrome [G89.4] 11/17/2016  . Chronic hyponatremia [E87.1] 11/17/2016  . PTSD (post-traumatic stress disorder) [F43.10] 11/17/2016  . Hernia, hiatal [K44.9] 11/17/2016  . Vitamin D deficiency [E55.9] 11/17/2016  . Vitamin B12 deficiency [E53.8] 11/17/2016  . SIADH (syndrome of inappropriate ADH production) ([place]) [E22.2] 09/08/2016  . Cervical spondylosis with myelopathy [M47.12] 05/24/2016  . Post-concussion headache [G44.309] 05/24/2016  . MCI (mild cognitive impairment) with memory loss [G31.84] 03/30/2016  . Gait abnormality [R26.9] 03/30/2016  . Chronic bipolar affective disorder ([place]) [F31.9] 01/24/2016  . Anemia, iron deficiency [D50.9] 06/24/2015  . Endometriosis [N80.9] 06/24/2015  . Essential hypertension [I10] 06/24/2015  . History of postmenopausal bleeding [Z87.42] 05/22/2015  . History of TIA (transient ischemic attack) [Z86.73] 04/20/2015  . Hypothyroidism [E03.9] 04/20/2015  . GERD (gastroesophageal reflux disease) [K21.9] 04/20/2015  . Cervical disc disorder with radiculopathy [M50.10] 03/25/2015  . Chronic neck pain [M54.2, G89.29] 03/25/2015  . Pelvic pain in female [R10.2] 10/30/2014  . History  of skin cancer [Z85.828] 06/30/2014  . Chronic headaches [R51] 12/16/2013  . Neuropathy [G62.9] 12/16/2013   Total Time spent with patient: 30 minutes  Past Psychiatric History: Long history with multiple hospitalizations  Past Medical History:  Past Medical History:  Diagnosis Date  . Allergic rhinitis   . Anemia   . Blurred vision   . Depression   . Diverticulosis   . Endometriosis   . Falls   . GERD (gastroesophageal reflux disease)   . Hernia, hiatal   . Hypothyroidism   . IBS (irritable bowel syndrome)   . Malignant neoplasm of skin   . Migraine   . Neuropathy   . PTSD (post-traumatic stress disorder)   . Thyroid disease     Past Surgical History:  Procedure Laterality Date  . APPENDECTOMY    . CHOLECYSTECTOMY    . CHOLECYSTECTOMY, LAPAROSCOPIC    . ESOPHAGOGASTRODUODENOSCOPY (EGD) WITH PROPOFOL N/A 03/29/2015   Procedure: ESOPHAGOGASTRODUODENOSCOPY (EGD) WITH PROPOFOL;  Surgeon: Josefine Class, MD;  Location: [facility] ENDOSCOPY;  Service: Endoscopy;  Laterality: N/A;  . HERNIA REPAIR    . laproscopy    . TONSILLECTOMY    . uteral suspension     Family History:  Family History  Problem Relation Age of Onset  . Heart disease Father   . Cancer Mother        lung  . Urolithiasis Neg Hx   . Kidney disease Neg Hx   . Kidney cancer Neg Hx   . Prostate cancer Neg Hx    Family Psychiatric  History: None seen previous note Social History:  Social History   Substance and Sexual Activity  Alcohol Use No     Social History   Substance and Sexual Activity  Drug Use No    Social History  Socioeconomic History  . Marital status: Single    Spouse name: Not on file  . Number of children: 0  . Years of education: 1.5 years of college  . Highest education level: Not on file  Occupational History  . Occupation: Retired  Scientific laboratory technician  . Financial resource strain: Not on file  . Food insecurity:    Worry: Not on file    Inability: Not on file  .  Transportation needs:    Medical: Not on file    Non-medical: Not on file  Tobacco Use  . Smoking status: Former Smoker    Last attempt to quit: 11/17/1976    Years since quitting: 40.9  . Smokeless tobacco: Never Used  Substance and Sexual Activity  . Alcohol use: No  . Drug use: No  . Sexual activity: Not Currently  Lifestyle  . Physical activity:    Days per week: Not on file    Minutes per session: Not on file  . Stress: Not on file  Relationships  . Social connections:    Talks on phone: Not on file    Gets together: Not on file    Attends religious service: Not on file    Active member of club or organization: Not on file    Attends meetings of clubs or organizations: Not on file    Relationship status: Not on file  Other Topics Concern  . Not on file  Social History Narrative   Lives at home alone.   Right-handed.   No daily caffeine use.   Additional Social History:    Pain Medications: See PTA Prescriptions: See PTA Over the Counter: See PTA History of alcohol / drug use?: No history of alcohol / drug abuse Longest period of sobriety (when/how long): Reports of none Negative Consequences of Use: (n/a) Withdrawal Symptoms: (n/a)                    Sleep: Fair  Appetite:  Fair  Current Medications: Current Facility-Administered Medications  Medication Dose Route Frequency Provider Last Rate Last Dose  . acetaminophen (TYLENOL) tablet 650 mg  650 mg Oral Q6H PRN Clapacs, Madie Reno, MD   650 mg at 11/01/17 1357  . alum & mag hydroxide-simeth (MAALOX/MYLANTA) 200-200-20 MG/5ML suspension 30 mL  30 mL Oral Q4H PRN Clapacs, John T, MD   30 mL at 10/08/17 1507  . amLODipine (NORVASC) tablet 5 mg  5 mg Oral BID Clapacs, Madie Reno, MD   5 mg at 11/02/17 1810  . artificial tears (LACRILUBE) ophthalmic ointment   Both Eyes Q8H Clapacs, John T, MD      . calcium carbonate (TUMS - dosed in mg elemental calcium) chewable tablet 400 mg of elemental calcium  2 tablet  Oral Q breakfast Pucilowska, Jolanta B, MD   400 mg of elemental calcium at 11/03/17 1234  . cholecalciferol (VITAMIN D) tablet 1,000 Units  1,000 Units Oral Daily Pucilowska, Jolanta B, MD   1,000 Units at 11/03/17 1239  . cycloSPORINE (RESTASIS) 0.05 % ophthalmic emulsion 1 drop  1 drop Both Eyes BID Clapacs, Madie Reno, MD   1 drop at 11/03/17 0912  . diclofenac sodium (VOLTAREN) 1 % transdermal gel 4 g  4 g Topical QID Pucilowska, Jolanta B, MD   4 g at 11/03/17 1244  . diphenhydrAMINE (BENADRYL) capsule 25 mg  25 mg Oral Q6H PRN Clapacs, Madie Reno, MD   25 mg at 11/02/17 2322  . feeding supplement (BOOST /  RESOURCE BREEZE) liquid 1 Container  1 Container Oral TID BM Clapacs, Madie Reno, MD   1 Container at 11/02/17 2318  . fentaNYL (SUBLIMAZE) injection 25 mcg  25 mcg Intravenous Q5 min PRN Alvin Critchley, MD      . ferrous sulfate tablet 325 mg  325 mg Oral Q breakfast Pucilowska, Jolanta B, MD   325 mg at 11/03/17 1238  . gabapentin (NEURONTIN) capsule 100 mg  100 mg Oral BID Clapacs, Madie Reno, MD   100 mg at 11/03/17 1240  . glycopyrrolate (ROBINUL) injection 0.1 mg  0.1 mg Intravenous Once Clapacs, John T, MD      . hydrocortisone cream 1 %   Topical BID Clapacs, John T, MD      . ipratropium (ATROVENT) 0.03 % nasal spray 2 spray  2 spray Each Nare TID He, Jun, MD   2 spray at 11/03/17 1245  . levalbuterol (XOPENEX) nebulizer solution 0.63 mg  0.63 mg Nebulization Q1400 Clapacs, Madie Reno, MD   0.63 mg at 11/02/17 1301  . levothyroxine (SYNTHROID, LEVOTHROID) tablet 100 mcg  100 mcg Oral QAC breakfast Pucilowska, Jolanta B, MD   100 mcg at 11/03/17 0856  . lidocaine (LIDODERM) 5 % 1 patch  1 patch Transdermal Q24H Pucilowska, Jolanta B, MD   1 patch at 11/02/17 1429  . linaclotide (LINZESS) capsule 290 mcg  290 mcg Oral QAC breakfast Pucilowska, Jolanta B, MD   290 mcg at 11/02/17 0854  . lip balm (BLISTEX) ointment   Topical PRN Clapacs, John T, MD      . loperamide (IMODIUM) capsule 2 mg  2 mg Oral PRN  Pucilowska, Jolanta B, MD   2 mg at 10/18/17 0002  . LORazepam (ATIVAN) tablet 0.5 mg  0.5 mg Oral Q6H PRN Clapacs, Madie Reno, MD   0.5 mg at 10/30/17 0033  . magnesium hydroxide (MILK OF MAGNESIA) suspension 30 mL  30 mL Oral Daily PRN Clapacs, Madie Reno, MD   30 mL at 10/05/17 1731  . magnesium oxide (MAG-OX) tablet 200 mg  200 mg Oral BID Pucilowska, Jolanta B, MD   200 mg at 11/03/17 1236  . midazolam (VERSED) injection 2 mg  2 mg Intravenous Once Clapacs, John T, MD      . multivitamin with minerals tablet 1 tablet  1 tablet Oral Daily Pucilowska, Jolanta B, MD   1 tablet at 11/03/17 1239  . omega-3 acid ethyl esters (LOVAZA) capsule 1 g  1 g Oral BID Pucilowska, Jolanta B, MD   1 g at 11/03/17 1237  . ondansetron (ZOFRAN) injection 4 mg  4 mg Intravenous Once Clapacs, John T, MD      . ondansetron [facility]) injection 4 mg  4 mg Intravenous Once PRN Gunnar Fusi, MD      . ondansetron [facility]) injection 4 mg  4 mg Intravenous Once PRN Gunnar Bulla, MD      . ondansetron [facility]) injection 4 mg  4 mg Intravenous Once PRN Alvin Critchley, MD      . ondansetron [facility]) injection 4 mg  4 mg Intravenous Once Clapacs, John T, MD      . ondansetron [facility]) injection 4 mg  4 mg Intravenous Once PRN Alvin Critchley, MD      . ondansetron (ZOFRAN-ODT) disintegrating tablet 4 mg  4 mg Oral Q8H PRN Pucilowska, Jolanta B, MD   4 mg at 10/08/17 1807  . PARoxetine (PAXIL-CR) 24 hr tablet 25 mg  25 mg Oral Daily Clapacs, John  T, MD   25 mg at 11/03/17 1232  . polyethylene glycol (MIRALAX / GLYCOLAX) packet 17 g  17 g Oral Daily Pucilowska, Jolanta B, MD   17 g at 11/02/17 0942  . polyvinyl alcohol (LIQUIFILM TEARS) 1.4 % ophthalmic solution 1 drop  1 drop Both Eyes PRN Ramond Dial, MD   1 drop at 11/02/17 0107  . prazosin (MINIPRESS) capsule 2 mg  2 mg Oral QHS Clapacs, Madie Reno, MD   2 mg at 11/02/17 2316  . promethazine (PHENERGAN) tablet 12.5 mg  12.5 mg Oral Q6H PRN Clapacs, John T, MD   12.5 mg at  10/24/17 1234  . pyridOXINE (VITAMIN B-6) tablet 100 mg  100 mg Oral BID Clapacs, Madie Reno, MD   100 mg at 11/03/17 1234  . QUEtiapine (SEROQUEL) tablet 100 mg  100 mg Oral QHS Clapacs, John T, MD   100 mg at 11/02/17 2316  . tiZANidine (ZANAFLEX) tablet 2 mg  2 mg Oral Q8H PRN Pucilowska, Jolanta B, MD   2 mg at 11/02/17 2321  . traZODone (DESYREL) tablet 100 mg  100 mg Oral QHS Clapacs, John T, MD   100 mg at 11/02/17 2317  . triamcinolone cream (KENALOG) 0.1 %   Topical TID PRN Ramond Dial, MD   1 application at 74/25/95 2106  . tuberculin injection 5 Units  5 Units Intradermal Once Pucilowska, Jolanta B, MD   5 Units at 11/01/17 1404  . vitamin C (ASCORBIC ACID) tablet 1,000 mg  1,000 mg Oral Daily Pucilowska, Jolanta B, MD   1,000 mg at 11/03/17 1235    Lab Results: No results found for this or any previous visit (from the past 48 hour(s)).  Blood Alcohol level:  Lab Results  Component Value Date   ETH <5 09/29/2016   ETH <5 63/87/5643    Metabolic Disorder Labs: Lab Results  Component Value Date   HGBA1C 5.7 (H) 09/20/2017   MPG 116.89 09/20/2017   No results found for: PROLACTIN Lab Results  Component Value Date   CHOL 173 09/20/2017   TRIG 193 (H) 09/20/2017   HDL 50 09/20/2017   CHOLHDL 3.5 09/20/2017   VLDL 39 09/20/2017   LDLCALC 84 09/20/2017   LDLCALC 87 09/04/2017    Physical Findings: AIMS: Facial and Oral Movements Muscles of Facial Expression: None, normal Lips and Perioral Area: None, normal Jaw: None, normal Tongue: None, normal,Extremity Movements Upper (arms, wrists, hands, fingers): None, normal Lower (legs, knees, ankles, toes): None, normal, Trunk Movements Neck, shoulders, hips: None, normal, Overall Severity Severity of abnormal movements (highest score from questions above): None, normal Incapacitation due to abnormal movements: None, normal Patient's awareness of abnormal movements (rate only patient's report): No Awareness, Dental  Status Current problems with teeth and/or dentures?: No Does patient usually wear dentures?: No  CIWA:    COWS:     Musculoskeletal: Strength & Muscle Tone: within normal limits Gait & Station: normal Patient leans: N/A  Psychiatric Specialty Exam: VS and  nursing notes reviwed    Blood pressure 99/61, pulse (!) 56, temperature 98.5 F (36.9 C), temperature source Oral, resp. rate 18, height 5\' 3"  (1.6 m), weight 53 kg, SpO2 96 %.Body mass index is 20.7 kg/m.  General Appearance: Casual  Eye Contact:  Fair  Speech:  Clear and Coherent  Volume:  Increased  Mood:  Anxious  Affect:  Labile, irritable  Thought Process:  Disorganized  Orientation:  Full (Time, Place, and Person)  Thought Content:  somatic  Suicidal Thoughts:  No  Homicidal Thoughts:  No  Memory:  Immediate;   Fair Recent;   Fair Remote;   Fair  Judgement:  Impaired  Insight:  Lacking  Psychomotor Activity:  Decreased  Concentration:  Concentration: Fair  Recall:  [name]:  Fair  Language:  Fair  Akathisia:  No  Handed:  Right  AIMS (if indicated):     Assets:  Desire for Improvement  ADL's:  Intact  Cognition:  Impaired,  Mild  Sleep:  Number of Hours: 5     Treatment Plan Summary:  Pt with bipolar disorder, mood improving slowly. Pt labile.  Plan- Cont current meds- paxil, minipress, seroquel,  group/milieu tx.  Lenward Chancellor, MD 11/03/2017, 12:57 PMPatient ID: Angela [name], female   DOB: 11-11-1950, 67 y.o.   MRN: [id number]

## 2017-11-03 NOTE — B[facility] Group Notes (Signed)
LCSW Group Therapy Note  11/03/2017 1:15pm  Type of Therapy and Topic:  Group Therapy:  Cognitive Distortions  Participation Level:  Active   Description of Group:    Patients in this group will be introduced to the topic of cognitive distortions.  Patients will identify and describe cognitive distortions, describe the feelings these distortions create for them.  Patients will identify one or more situations in their personal life where they have cognitively distorted thinking and will verbalize challenging this cognitive distortion through positive thinking skills.  Patients will practice the skill of using positive affirmations to challenge cognitive distortions using affirmation cards.    Therapeutic Goals:  1. Patient will identify two or more cognitive distortions they have used 2. Patient will identify one or more emotions that stem from use of a cognitive distortion 3. Patient will demonstrate use of a positive affirmation to counter a cognitive distortion through discussion and/or role play. 4. Patient will describe one way cognitive distortions can be detrimental to wellness   Summary of Patient Progress: The patient reported that  she feels "tired." Patients were introduced to the topic of cognitive distortions. The patient was able to identify and describe cognitive distortions, described the feelings these distortions create for her. The patient shared her most used unhelpful thinking style is" mental filter." Patient identified a situation in her personal life where she has cognitively distorted thinking and was able to verbalize and challenged this cognitive distortion through positive thinking skills. Patient was able to provide support and validation to other group members.      Therapeutic Modalities:   Cognitive Behavioral Therapy Motivational Interviewing   Angela [name]  [name], LCSW 11/03/2017 10:32 AM

## 2017-11-03 NOTE — Progress Notes (Signed)
OT Cancellation Note  Patient Details Name: Angela [name] MRN: [id number] DOB: [birth year]   Cancelled Treatment:    Reason Eval/Treat Not Completed: Other (comment). Spoke with RN to see whether pt was available/appropriate for OT tx this am. RN states it is lunch time and pt never got up for breakfast. RN recommending OT come back at later time. Will re-attempt OT tx at later date/time as pt is available and as schedule permits.   Jeni Salles, MPH, MS, OTR/L ascom [phone] 11/03/17, 11:49 AM

## 2017-11-03 NOTE — Progress Notes (Signed)
D:Able to verbalize positive feeling of self .The ability to make decisions is ongoing patient continue to need direction with this goal. Working on coping skills . Marland KitchenCompliant with medication received . Denies suicidal ideations . Voice of no safety concerns . Patient stated slept good last night .Stated appetite is good and energy level  Is normal. Stated concentration is good  Denies suicidal  homicidal ideations  .  No auditory hallucinations  No pain concerns . Appropriate ADL'S. Interacting with peers and staff. Patient sleeping late in am  missed appointment with physical therapy Refused to get for medications . Stated today she needed to call places of placement. Irritable and labile at times  During shift. A: Encourage patient participation with unit programming . Instruction  Given on  Medication , verbalize understanding. R: Voice no other concerns. Staff continue to monitor

## 2017-11-03 NOTE — Progress Notes (Signed)
Angela [name] has been more cooperative and less anxious. Continues to talk about her upcoming discharge, sounding anxious about it. Patient complained of sore throat with difficulty swallowing. Patient walked frequently in the hallway. Had a snack and received medications.  Continues to frequently come to the nurses station wanting to talk to staff for a long time. Pleasant and redirectable.  No major concern. Staff continue to monitor and to provide support and encouragements.

## 2017-11-03 NOTE — Progress Notes (Signed)
   11/03/17 1620  Clinical Encounter Type  Visited With Patient;Health care provider  Visit Type Follow-up;Psychological support;Spiritual support;Behavioral Health  Referral From Nurse  Consult/Referral To Chaplain  Spiritual Encounters  Spiritual Needs Prayer;Emotional   [facility] received a page from [employer] to come and speak with Ms. Dekay at her request. I have spoken with the patient on several occasions. She is always fixated on the idea of her past and either she has been emotional hurt by the past or like today she has hurt others in the past. She wanted to know how to start over again in her life at her age. I offered active listening and attempted to let her talk through her feelings of past friendships. The patient asked me to pray which I did. I will follow up as needed

## 2017-11-03 NOTE — Progress Notes (Signed)
Patient stayed around and frequently came to the nurses station expressing anxiety related  to upcoming placement. Complaining of swelling feet and requesting to be seen by a provider. Patient otherwise  stayed around and pleasant. Currently in bed resting. Staff continue to monitor for safety.

## 2017-11-03 NOTE — Plan of Care (Signed)
Able to verbalize positive feeling of self . The ability to make decisions is ongoing  patient continue to need  direction with this goal. Working on coping  skills . Social Workers assisting with  placement  needs  with resources available.Compliant  with medication received . Denies suicidal  ideations . Voice of no safety concerns .    Problem: Education: Goal: Ability to make informed decisions regarding treatment will improve Outcome: Progressing   Problem: Coping: Goal: Coping ability will improve Outcome: Progressing   Problem: Health Behavior/Discharge Planning: Goal: Identification of resources available to assist in meeting health care needs will improve Outcome: Progressing   Problem: Medication: Goal: Compliance with prescribed medication regimen will improve Outcome: Progressing   Problem: Self-Concept: Goal: Ability to disclose and discuss suicidal ideas will improve Outcome: Progressing Goal: Will verbalize positive feelings about self Outcome: Progressing   Problem: Safety: Goal: Ability to remain free from injury will improve Outcome: Progressing   Problem: Spiritual Needs Goal: Ability to function at adequate level Outcome: Progressing

## 2017-11-04 MED ORDER — MENTHOL 3 MG MT LOZG
1.0000 | LOZENGE | Freq: Four times a day (QID) | OROMUCOSAL | Status: DC | PRN
  Administered 2017-11-04: 3 mg via ORAL
  Filled 2017-11-04: qty 9

## 2017-11-04 NOTE — Plan of Care (Signed)
Endorsing anxiety related to upcoming discharge. Otherwise cooperative

## 2017-11-04 NOTE — Plan of Care (Signed)
Patient up in the milieu with a steady gait. In bed majority of the shift, up this afternoon after lunch. Complained of sore throat, Cepacol ordered per Md. Patient complained of the sore throat possibly being strep throat. MD made aware and requested that RN assess the throat to check for signs and symptoms of infection. RN will follow up with the request. Milieu otherwise safe.

## 2017-11-04 NOTE — Progress Notes (Signed)
Marley continues to express dissatisfaction related to upcoming discharge, reporting that "nobody has discussed it with me in details, I don't even know where I am going, I don't even know anybody there....". Pacing in the milieu, irritable with attention seeking behaviors. Frequently calling the nurse to discuss the same issue over and over. However, patient is pleasant with sense of humor. Has a snack and received bedtime medications. Emotional support provided. Safety precautions maintained.

## 2017-11-04 NOTE — Progress Notes (Signed)
[facility] MD Progress Note  11/04/2017 10:34 AM Angela [name]  MRN:  [id number] Subjective:  I need something for sore throat" Follow-up for this patient with chronic mood instability.     Pt anxious, abile, somatic wanting sth for sore throat, denies fever, cough.  She is not reporting any suicidal thoughts.  Denies AVH.   Principal Problem: Bipolar I disorder, most recent episode mixed, severe with psychotic features ([place]) Diagnosis:   Patient Active Problem List   Diagnosis Date Noted  . Bipolar I disorder, most recent episode mixed, severe with psychotic features ([place]) [F31.64] 09/18/2017  . Agitation [R45.1] 09/18/2017  . IBS (irritable bowel syndrome) [K58.9] 11/17/2016  . Bilateral carpal tunnel syndrome [G56.03] 11/17/2016  . Plantar fasciitis, bilateral [M72.2] 11/17/2016  . Chronic pain syndrome [G89.4] 11/17/2016  . Chronic hyponatremia [E87.1] 11/17/2016  . PTSD (post-traumatic stress disorder) [F43.10] 11/17/2016  . Hernia, hiatal [K44.9] 11/17/2016  . Vitamin D deficiency [E55.9] 11/17/2016  . Vitamin B12 deficiency [E53.8] 11/17/2016  . SIADH (syndrome of inappropriate ADH production) ([place]) [E22.2] 09/08/2016  . Cervical spondylosis with myelopathy [M47.12] 05/24/2016  . Post-concussion headache [G44.309] 05/24/2016  . MCI (mild cognitive impairment) with memory loss [G31.84] 03/30/2016  . Gait abnormality [R26.9] 03/30/2016  . Chronic bipolar affective disorder ([place]) [F31.9] 01/24/2016  . Anemia, iron deficiency [D50.9] 06/24/2015  . Endometriosis [N80.9] 06/24/2015  . Essential hypertension [I10] 06/24/2015  . History of postmenopausal bleeding [Z87.42] 05/22/2015  . History of TIA (transient ischemic attack) [Z86.73] 04/20/2015  . Hypothyroidism [E03.9] 04/20/2015  . GERD (gastroesophageal reflux disease) [K21.9] 04/20/2015  . Cervical disc disorder with radiculopathy [M50.10] 03/25/2015  . Chronic neck pain [M54.2, G89.29] 03/25/2015  . Pelvic pain in female  [R10.2] 10/30/2014  . History of skin cancer [Z85.828] 06/30/2014  . Chronic headaches [R51] 12/16/2013  . Neuropathy [G62.9] 12/16/2013   Total Time spent with patient: 30 minutes  Past Psychiatric History: Long history with multiple hospitalizations  Past Medical History:  Past Medical History:  Diagnosis Date  . Allergic rhinitis   . Anemia   . Blurred vision   . Depression   . Diverticulosis   . Endometriosis   . Falls   . GERD (gastroesophageal reflux disease)   . Hernia, hiatal   . Hypothyroidism   . IBS (irritable bowel syndrome)   . Malignant neoplasm of skin   . Migraine   . Neuropathy   . PTSD (post-traumatic stress disorder)   . Thyroid disease     Past Surgical History:  Procedure Laterality Date  . APPENDECTOMY    . CHOLECYSTECTOMY    . CHOLECYSTECTOMY, LAPAROSCOPIC    . ESOPHAGOGASTRODUODENOSCOPY (EGD) WITH PROPOFOL N/A 03/29/2015   Procedure: ESOPHAGOGASTRODUODENOSCOPY (EGD) WITH PROPOFOL;  Surgeon: Josefine Class, MD;  Location: [facility] ENDOSCOPY;  Service: Endoscopy;  Laterality: N/A;  . HERNIA REPAIR    . laproscopy    . TONSILLECTOMY    . uteral suspension     Family History:  Family History  Problem Relation Age of Onset  . Heart disease Father   . Cancer Mother        lung  . Urolithiasis Neg Hx   . Kidney disease Neg Hx   . Kidney cancer Neg Hx   . Prostate cancer Neg Hx    Family Psychiatric  History: None seen previous note Social History:  Social History   Substance and Sexual Activity  Alcohol Use No     Social History   Substance and Sexual Activity  Drug Use No    Social History   Socioeconomic History  . Marital status: Single    Spouse name: Not on file  . Number of children: 0  . Years of education: 1.5 years of college  . Highest education level: Not on file  Occupational History  . Occupation: Retired  Scientific laboratory technician  . Financial resource strain: Not on file  . Food insecurity:    Worry: Not on file     Inability: Not on file  . Transportation needs:    Medical: Not on file    Non-medical: Not on file  Tobacco Use  . Smoking status: Former Smoker    Last attempt to quit: 11/17/1976    Years since quitting: 40.9  . Smokeless tobacco: Never Used  Substance and Sexual Activity  . Alcohol use: No  . Drug use: No  . Sexual activity: Not Currently  Lifestyle  . Physical activity:    Days per week: Not on file    Minutes per session: Not on file  . Stress: Not on file  Relationships  . Social connections:    Talks on phone: Not on file    Gets together: Not on file    Attends religious service: Not on file    Active member of club or organization: Not on file    Attends meetings of clubs or organizations: Not on file    Relationship status: Not on file  Other Topics Concern  . Not on file  Social History Narrative   Lives at home alone.   Right-handed.   No daily caffeine use.   Additional Social History:    Pain Medications: See PTA Prescriptions: See PTA Over the Counter: See PTA History of alcohol / drug use?: No history of alcohol / drug abuse Longest period of sobriety (when/how long): Reports of none Negative Consequences of Use: (n/a) Withdrawal Symptoms: (n/a)                    Sleep: Fair  Appetite:  Fair  Current Medications: Current Facility-Administered Medications  Medication Dose Route Frequency Provider Last Rate Last Dose  . acetaminophen (TYLENOL) tablet 650 mg  650 mg Oral Q6H PRN Clapacs, Madie Reno, MD   650 mg at 11/03/17 1657  . alum & mag hydroxide-simeth (MAALOX/MYLANTA) 200-200-20 MG/5ML suspension 30 mL  30 mL Oral Q4H PRN Clapacs, John T, MD   30 mL at 10/08/17 1507  . amLODipine (NORVASC) tablet 5 mg  5 mg Oral BID Clapacs, Madie Reno, MD   5 mg at 11/03/17 1657  . artificial tears (LACRILUBE) ophthalmic ointment   Both Eyes Q8H Clapacs, John T, MD      . calcium carbonate (TUMS - dosed in mg elemental calcium) chewable tablet 400 mg of  elemental calcium  2 tablet Oral Q breakfast Pucilowska, Jolanta B, MD   400 mg of elemental calcium at 11/03/17 1234  . cholecalciferol (VITAMIN D) tablet 1,000 Units  1,000 Units Oral Daily Pucilowska, Jolanta B, MD   1,000 Units at 11/03/17 1239  . cycloSPORINE (RESTASIS) 0.05 % ophthalmic emulsion 1 drop  1 drop Both Eyes BID Clapacs, Madie Reno, MD   1 drop at 11/03/17 2235  . diclofenac sodium (VOLTAREN) 1 % transdermal gel 4 g  4 g Topical QID Pucilowska, Jolanta B, MD   4 g at 11/03/17 2234  . diphenhydrAMINE (BENADRYL) capsule 25 mg  25 mg Oral Q6H PRN Clapacs, Madie Reno, MD   25  mg at 11/03/17 2307  . feeding supplement (BOOST / RESOURCE BREEZE) liquid 1 Container  1 Container Oral TID BM Clapacs, Madie Reno, MD   1 Container at 11/03/17 2236  . fentaNYL (SUBLIMAZE) injection 25 mcg  25 mcg Intravenous Q5 min PRN Alvin Critchley, MD      . ferrous sulfate tablet 325 mg  325 mg Oral Q breakfast Pucilowska, Jolanta B, MD   325 mg at 11/03/17 1238  . gabapentin (NEURONTIN) capsule 100 mg  100 mg Oral BID Clapacs, Madie Reno, MD   100 mg at 11/03/17 1657  . glycopyrrolate (ROBINUL) injection 0.1 mg  0.1 mg Intravenous Once Clapacs, John T, MD      . hydrocortisone cream 1 %   Topical BID Clapacs, John T, MD      . ipratropium (ATROVENT) 0.03 % nasal spray 2 spray  2 spray Each Nare TID He, Jun, MD   2 spray at 11/03/17 1657  . levalbuterol (XOPENEX) nebulizer solution 0.63 mg  0.63 mg Nebulization Q1400 Clapacs, Madie Reno, MD   0.63 mg at 11/03/17 1453  . levothyroxine (SYNTHROID, LEVOTHROID) tablet 100 mcg  100 mcg Oral QAC breakfast Pucilowska, Jolanta B, MD   100 mcg at 11/04/17 0659  . lidocaine (LIDODERM) 5 % 1 patch  1 patch Transdermal Q24H Pucilowska, Jolanta B, MD   1 patch at 11/02/17 1429  . linaclotide (LINZESS) capsule 290 mcg  290 mcg Oral QAC breakfast Pucilowska, Jolanta B, MD   290 mcg at 11/04/17 0700  . lip balm (BLISTEX) ointment   Topical PRN Clapacs, John T, MD      . loperamide (IMODIUM)  capsule 2 mg  2 mg Oral PRN Pucilowska, Jolanta B, MD   2 mg at 10/18/17 0002  . LORazepam (ATIVAN) tablet 0.5 mg  0.5 mg Oral Q6H PRN Clapacs, Madie Reno, MD   0.5 mg at 10/30/17 0033  . magnesium hydroxide (MILK OF MAGNESIA) suspension 30 mL  30 mL Oral Daily PRN Clapacs, Madie Reno, MD   30 mL at 10/05/17 1731  . magnesium oxide (MAG-OX) tablet 200 mg  200 mg Oral BID Pucilowska, Jolanta B, MD   200 mg at 11/03/17 1702  . menthol-cetylpyridinium (CEPACOL) lozenge 3 mg  1 lozenge Oral Q6H PRN Lenward Chancellor, MD      . midazolam (VERSED) injection 2 mg  2 mg Intravenous Once Clapacs, John T, MD      . multivitamin with minerals tablet 1 tablet  1 tablet Oral Daily Pucilowska, Jolanta B, MD   1 tablet at 11/03/17 1239  . omega-3 acid ethyl esters (LOVAZA) capsule 1 g  1 g Oral BID Pucilowska, Jolanta B, MD   1 g at 11/03/17 1702  . ondansetron (ZOFRAN) injection 4 mg  4 mg Intravenous Once Clapacs, John T, MD      . ondansetron [facility]) injection 4 mg  4 mg Intravenous Once PRN Gunnar Fusi, MD      . ondansetron [facility]) injection 4 mg  4 mg Intravenous Once PRN Gunnar Bulla, MD      . ondansetron [facility]) injection 4 mg  4 mg Intravenous Once PRN Alvin Critchley, MD      . ondansetron [facility]) injection 4 mg  4 mg Intravenous Once Clapacs, John T, MD      . ondansetron [facility]) injection 4 mg  4 mg Intravenous Once PRN Alvin Critchley, MD      . ondansetron (ZOFRAN-ODT) disintegrating tablet 4 mg  4 mg  Oral Q8H PRN Pucilowska, Jolanta B, MD   4 mg at 10/08/17 1807  . PARoxetine (PAXIL-CR) 24 hr tablet 25 mg  25 mg Oral Daily Clapacs, Madie Reno, MD   25 mg at 11/03/17 1232  . polyethylene glycol (MIRALAX / GLYCOLAX) packet 17 g  17 g Oral Daily Pucilowska, Jolanta B, MD   17 g at 11/02/17 0942  . polyvinyl alcohol (LIQUIFILM TEARS) 1.4 % ophthalmic solution 1 drop  1 drop Both Eyes PRN Ramond Dial, MD   1 drop at 11/02/17 0107  . prazosin (MINIPRESS) capsule 2 mg  2 mg Oral QHS Clapacs, Madie Reno,  MD   2 mg at 11/03/17 2236  . promethazine (PHENERGAN) tablet 12.5 mg  12.5 mg Oral Q6H PRN Clapacs, John T, MD   12.5 mg at 10/24/17 1234  . pyridOXINE (VITAMIN B-6) tablet 100 mg  100 mg Oral BID Clapacs, Madie Reno, MD   100 mg at 11/03/17 1704  . QUEtiapine (SEROQUEL) tablet 100 mg  100 mg Oral QHS Clapacs, John T, MD   100 mg at 11/03/17 2237  . tiZANidine (ZANAFLEX) tablet 2 mg  2 mg Oral Q8H PRN Pucilowska, Jolanta B, MD   2 mg at 11/03/17 2253  . traZODone (DESYREL) tablet 100 mg  100 mg Oral QHS Clapacs, Madie Reno, MD   100 mg at 11/03/17 2237  . triamcinolone cream (KENALOG) 0.1 %   Topical TID PRN Ramond Dial, MD   1 application at 55/73/22 2106  . vitamin C (ASCORBIC ACID) tablet 1,000 mg  1,000 mg Oral Daily Pucilowska, Jolanta B, MD   1,000 mg at 11/03/17 1235    Lab Results: No results found for this or any previous visit (from the past 48 hour(s)).  Blood Alcohol level:  Lab Results  Component Value Date   ETH <5 09/29/2016   ETH <5 02/54/2706    Metabolic Disorder Labs: Lab Results  Component Value Date   HGBA1C 5.7 (H) 09/20/2017   MPG 116.89 09/20/2017   No results found for: PROLACTIN Lab Results  Component Value Date   CHOL 173 09/20/2017   TRIG 193 (H) 09/20/2017   HDL 50 09/20/2017   CHOLHDL 3.5 09/20/2017   VLDL 39 09/20/2017   LDLCALC 84 09/20/2017   LDLCALC 87 09/04/2017    Physical Findings: AIMS: Facial and Oral Movements Muscles of Facial Expression: None, normal Lips and Perioral Area: None, normal Jaw: None, normal Tongue: None, normal,Extremity Movements Upper (arms, wrists, hands, fingers): None, normal Lower (legs, knees, ankles, toes): None, normal, Trunk Movements Neck, shoulders, hips: None, normal, Overall Severity Severity of abnormal movements (highest score from questions above): None, normal Incapacitation due to abnormal movements: None, normal Patient's awareness of abnormal movements (rate only patient's report): No  Awareness, Dental Status Current problems with teeth and/or dentures?: No Does patient usually wear dentures?: No  CIWA:    COWS:     Musculoskeletal: Strength & Muscle Tone: within normal limits Gait & Station: normal Patient leans: N/A  Psychiatric Specialty Exam: VS and  nursing notes reviwed    Blood pressure 128/72, pulse (!) 52, temperature 97.9 F (36.6 C), temperature source Oral, resp. rate 18, height 5\' 3"  (1.6 m), weight 53 kg, SpO2 95 %.Body mass index is 20.7 kg/m.  General Appearance: Casual  Eye Contact:  Fair  Speech:  Clear and Coherent  Volume:  Increased  Mood:  Anxious  Affect:  Labile, irritable  Thought Process:  Disorganized  Orientation:  Full (Time,  Place, and Person)  Thought Content:  Somatic, denies AVH  Suicidal Thoughts:  No  Homicidal Thoughts:  No  Memory:  Immediate;   Fair Recent;   Fair Remote;   Fair  Judgement:  Impaired  Insight:  Lacking  Psychomotor Activity:  Decreased  Concentration:  Concentration: Fair  Recall:  [name]:  Fair  Language:  Fair  Akathisia:  No  Handed:  Right  AIMS (if indicated):     Assets:  Desire for Improvement  ADL's:  Intact  Cognition:  Impaired,  Mild  Sleep:  Number of Hours: 6     Treatment Plan Summary:  Pt with bipolar disorder, mood improving slowly. Pt labile, anxious, somatic.  Plan- Cont current meds- paxil, minipress, seroquel,  Cepacol prn for sore throat. group/milieu tx.  Lenward Chancellor, MD 11/04/2017, 10:34 AMPatient ID: Angela [name], female   DOB: 06-20-1950, 67 y.o.   MRN: [id number] Patient ID: Angela [name], female   DOB: 15-Aug-1950, 67 y.o.   MRN: [id number]

## 2017-11-04 NOTE — Progress Notes (Signed)
Assessed patient's throat. Mild redness noted, no white patches to area noted. Cepacol administered with semi-effective results.

## 2017-11-04 NOTE — [facility] Group Notes (Signed)
LCSW Group Therapy Note 11/04/2017 1:15pm  Type of Therapy and Topic: Group Therapy: Feelings Around Returning Home & Establishing a Supportive Framework and Supporting Oneself When Supports Not Available  Participation Level: Did Not Attend  Description of Group:  Patients first processed thoughts and feelings about upcoming discharge. These included fears of upcoming changes, lack of change, new living environments, judgements and expectations from others and overall stigma of mental health issues. The group then discussed the definition of a supportive framework, what that looks and feels like, and how do to discern it from an unhealthy non-supportive network. The group identified different types of supports as well as what to do when your family/friends are less than helpful or unavailable  Therapeutic Goals  1. Patient will identify one healthy supportive network that they can use at discharge. 2. Patient will identify one factor of a supportive framework and how to tell it from an unhealthy network. 3. Patient able to identify one coping skill to use when they do not have positive supports from others. 4. Patient will demonstrate ability to communicate their needs through discussion and/or role plays.  Summary of Patient Progress:  Pt was invited to attend group but chose not to attend. CSW will continue to encourage pt to attend group throughout their admission.   Therapeutic Modalities Cognitive Behavioral Therapy Motivational Interviewing   Meriel Kelliher  [name], LCSW 11/04/2017 12:00 PM

## 2017-11-04 NOTE — Progress Notes (Signed)
2030: Patient complained of sore throat and received Cepacol . Scanner not fucntioning

## 2017-11-05 ENCOUNTER — Encounter: Payer: Self-pay | Admitting: Anesthesiology

## 2017-11-05 ENCOUNTER — Other Ambulatory Visit: Payer: Self-pay | Admitting: Psychiatry

## 2017-11-05 LAB — BASIC METABOLIC PANEL
Anion gap: 7 (ref 5–15)
BUN: 18 mg/dL (ref 8–23)
CHLORIDE: 104 mmol/L (ref 98–111)
CO2: 29 mmol/L (ref 22–32)
Calcium: 9 mg/dL (ref 8.9–10.3)
Creatinine, Ser: 0.78 mg/dL (ref 0.44–1.00)
GFR calc non Af Amer: >60 mL/min (ref >60)
Glucose, Bld: 85 mg/dL (ref 70–99)
POTASSIUM: 4.3 mmol/L (ref 3.5–5.1)
SODIUM: 140 mmol/L (ref 135–145)

## 2017-11-05 MED ORDER — GUAIFENESIN ER 600 MG PO TB12
600.0000 mg | ORAL_TABLET | Freq: Two times a day (BID) | ORAL | Status: DC
  Administered 2017-11-05 – 2017-11-06 (×2): 600 mg via ORAL
  Filled 2017-11-05 (×3): qty 1

## 2017-11-05 MED ORDER — DEXTROMETHORPHAN POLISTIREX ER 30 MG/5ML PO SUER
30.0000 mg | Freq: Two times a day (BID) | ORAL | Status: DC
  Administered 2017-11-05 – 2017-11-06 (×2): 30 mg via ORAL
  Filled 2017-11-05 (×3): qty 5

## 2017-11-05 MED ORDER — DM-GUAIFENESIN ER 30-600 MG PO TB12: 1.0000 | ORAL_TABLET | Freq: Two times a day (BID) | ORAL | Status: DC
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
## 2017-11-05 NOTE — Progress Notes (Signed)
[facility] MD Progress Note  11/05/2017 7:20 PM Angela [name]  MRN:  [id number] Subjective: Follow-up note for this patient with bipolar disorder and mood instability.  Patient refused ECT treatment this morning because she was feeling sick.  Mood is pretty stable which is to say chronically labile irritable and with lots of complaints of anxiety but not going to the worst extremes.  Not reporting any suicidal ideation.  As usual she is very sad focused on multiple somatic complaints.  She is complaining of flank pain and dysuria and also complaining of sore throat.  She requests a strep swab and a urine analysis.  Both will be ordered.  Patient complains that no one has found her [place] place to live.  Tried to talk through some options with her and of course all of it led to one impossibility after another. Principal Problem: Bipolar I disorder, most recent episode mixed, severe with psychotic features ([place]) Diagnosis:   Patient Active Problem List   Diagnosis Date Noted  . Bipolar I disorder, most recent episode mixed, severe with psychotic features ([place]) [F31.64] 09/18/2017  . Agitation [R45.1] 09/18/2017  . IBS (irritable bowel syndrome) [K58.9] 11/17/2016  . Bilateral carpal tunnel syndrome [G56.03] 11/17/2016  . Plantar fasciitis, bilateral [M72.2] 11/17/2016  . Chronic pain syndrome [G89.4] 11/17/2016  . Chronic hyponatremia [E87.1] 11/17/2016  . PTSD (post-traumatic stress disorder) [F43.10] 11/17/2016  . Hernia, hiatal [K44.9] 11/17/2016  . Vitamin D deficiency [E55.9] 11/17/2016  . Vitamin B12 deficiency [E53.8] 11/17/2016  . SIADH (syndrome of inappropriate ADH production) ([place]) [E22.2] 09/08/2016  . Cervical spondylosis with myelopathy [M47.12] 05/24/2016  . Post-concussion headache [G44.309] 05/24/2016  . MCI (mild cognitive impairment) with memory loss [G31.84] 03/30/2016  . Gait abnormality [R26.9] 03/30/2016  . Chronic bipolar affective disorder ([place]) [F31.9] 01/24/2016  .  Anemia, iron deficiency [D50.9] 06/24/2015  . Endometriosis [N80.9] 06/24/2015  . Essential hypertension [I10] 06/24/2015  . History of postmenopausal bleeding [Z87.42] 05/22/2015  . History of TIA (transient ischemic attack) [Z86.73] 04/20/2015  . Hypothyroidism [E03.9] 04/20/2015  . GERD (gastroesophageal reflux disease) [K21.9] 04/20/2015  . Cervical disc disorder with radiculopathy [M50.10] 03/25/2015  . Chronic neck pain [M54.2, G89.29] 03/25/2015  . Pelvic pain in female [R10.2] 10/30/2014  . History of skin cancer [Z85.828] 06/30/2014  . Chronic headaches [R51] 12/16/2013  . Neuropathy [G62.9] 12/16/2013   Total Time spent with patient: 30 minutes  Past Psychiatric History: Long history essentially lifelong of mood instability and behavior problems and poor function  Past Medical History:  Past Medical History:  Diagnosis Date  . Allergic rhinitis   . Anemia   . Blurred vision   . Depression   . Diverticulosis   . Endometriosis   . Falls   . GERD (gastroesophageal reflux disease)   . Hernia, hiatal   . Hypothyroidism   . IBS (irritable bowel syndrome)   . Malignant neoplasm of skin   . Migraine   . Neuropathy   . PTSD (post-traumatic stress disorder)   . Thyroid disease     Past Surgical History:  Procedure Laterality Date  . APPENDECTOMY    . CHOLECYSTECTOMY    . CHOLECYSTECTOMY, LAPAROSCOPIC    . ESOPHAGOGASTRODUODENOSCOPY (EGD) WITH PROPOFOL N/A 03/29/2015   Procedure: ESOPHAGOGASTRODUODENOSCOPY (EGD) WITH PROPOFOL;  Surgeon: Josefine Class, MD;  Location: [facility] ENDOSCOPY;  Service: Endoscopy;  Laterality: N/A;  . HERNIA REPAIR    . laproscopy    . TONSILLECTOMY    . uteral suspension  Family History:  Family History  Problem Relation Age of Onset  . Heart disease Father   . Cancer Mother        lung  . Urolithiasis Neg Hx   . Kidney disease Neg Hx   . Kidney cancer Neg Hx   . Prostate cancer Neg Hx    Family Psychiatric  History: See  previous note Social History:  Social History   Substance and Sexual Activity  Alcohol Use No     Social History   Substance and Sexual Activity  Drug Use No    Social History   Socioeconomic History  . Marital status: Single    Spouse name: Not on file  . Number of children: 0  . Years of education: 1.5 years of college  . Highest education level: Not on file  Occupational History  . Occupation: Retired  Scientific laboratory technician  . Financial resource strain: Not on file  . Food insecurity:    Worry: Not on file    Inability: Not on file  . Transportation needs:    Medical: Not on file    Non-medical: Not on file  Tobacco Use  . Smoking status: Former Smoker    Last attempt to quit: 11/17/1976    Years since quitting: 40.9  . Smokeless tobacco: Never Used  Substance and Sexual Activity  . Alcohol use: No  . Drug use: No  . Sexual activity: Not Currently  Lifestyle  . Physical activity:    Days per week: Not on file    Minutes per session: Not on file  . Stress: Not on file  Relationships  . Social connections:    Talks on phone: Not on file    Gets together: Not on file    Attends religious service: Not on file    Active member of club or organization: Not on file    Attends meetings of clubs or organizations: Not on file    Relationship status: Not on file  Other Topics Concern  . Not on file  Social History Narrative   Lives at home alone.   Right-handed.   No daily caffeine use.   Additional Social History:    Pain Medications: See PTA Prescriptions: See PTA Over the Counter: See PTA History of alcohol / drug use?: No history of alcohol / drug abuse Longest period of sobriety (when/how long): Reports of none Negative Consequences of Use: (n/a) Withdrawal Symptoms: (n/a)                    Sleep: Fair  Appetite:  Fair  Current Medications: Current Facility-Administered Medications  Medication Dose Route Frequency Provider Last Rate Last Dose   . acetaminophen (TYLENOL) tablet 650 mg  650 mg Oral Q6H PRN Houa Ackert, Madie Reno, MD   650 mg at 11/05/17 1834  . alum & mag hydroxide-simeth (MAALOX/MYLANTA) 200-200-20 MG/5ML suspension 30 mL  30 mL Oral Q4H PRN Keyra Virella, Madie Reno, MD   30 mL at 11/04/17 2223  . amLODipine (NORVASC) tablet 5 mg  5 mg Oral BID Emilly Lavey, Madie Reno, MD   5 mg at 11/05/17 1613  . artificial tears (LACRILUBE) ophthalmic ointment   Both Eyes Q8H Annaliza Zia T, MD      . calcium carbonate (TUMS - dosed in mg elemental calcium) chewable tablet 400 mg of elemental calcium  2 tablet Oral Q breakfast Pucilowska, Jolanta B, MD   400 mg of elemental calcium at 11/05/17 1131  . cholecalciferol (VITAMIN D)  tablet 1,000 Units  1,000 Units Oral Daily Pucilowska, Jolanta B, MD   1,000 Units at 11/05/17 1132  . cycloSPORINE (RESTASIS) 0.05 % ophthalmic emulsion 1 drop  1 drop Both Eyes BID Graceanna Theissen, Madie Reno, MD   1 drop at 11/05/17 1134  . dextromethorphan-guaiFENesin (MUCINEX DM) 30-600 MG per 12 hr tablet 1 tablet  1 tablet Oral BID Yeira Gulden T, MD      . diclofenac sodium (VOLTAREN) 1 % transdermal gel 4 g  4 g Topical QID Pucilowska, Jolanta B, MD   4 g at 11/05/17 1614  . diphenhydrAMINE (BENADRYL) capsule 25 mg  25 mg Oral Q6H PRN Sible Straley, Madie Reno, MD   25 mg at 11/03/17 2307  . feeding supplement (BOOST / RESOURCE BREEZE) liquid 1 Container  1 Container Oral TID BM Savannaha Stonerock, Madie Reno, MD   1 Container at 11/05/17 1354  . fentaNYL (SUBLIMAZE) injection 25 mcg  25 mcg Intravenous Q5 min PRN Alvin Critchley, MD      . ferrous sulfate tablet 325 mg  325 mg Oral Q breakfast Pucilowska, Jolanta B, MD   325 mg at 11/05/17 1131  . gabapentin (NEURONTIN) capsule 100 mg  100 mg Oral BID Bucky Grigg, Madie Reno, MD   100 mg at 11/05/17 1613  . glycopyrrolate (ROBINUL) injection 0.1 mg  0.1 mg Intravenous Once Keirstyn Aydt T, MD      . hydrocortisone cream 1 %   Topical BID Ysidra Sopher T, MD      . ipratropium (ATROVENT) 0.03 % nasal spray 2 spray  2  spray Each Nare TID He, Jun, MD   2 spray at 11/05/17 1614  . levalbuterol (XOPENEX) nebulizer solution 0.63 mg  0.63 mg Nebulization Q1400 Scherry Laverne, Madie Reno, MD   0.63 mg at 11/05/17 1426  . levothyroxine (SYNTHROID, LEVOTHROID) tablet 100 mcg  100 mcg Oral QAC breakfast Pucilowska, Jolanta B, MD   100 mcg at 11/05/17 0651  . lidocaine (LIDODERM) 5 % 1 patch  1 patch Transdermal Q24H Pucilowska, Jolanta B, MD   1 patch at 11/05/17 1134  . linaclotide (LINZESS) capsule 290 mcg  290 mcg Oral QAC breakfast Pucilowska, Jolanta B, MD   290 mcg at 11/05/17 0651  . lip balm (BLISTEX) ointment   Topical PRN [place] Rabenold T, MD      . loperamide (IMODIUM) capsule 2 mg  2 mg Oral PRN Pucilowska, Jolanta B, MD   2 mg at 11/05/17 1455  . LORazepam (ATIVAN) tablet 0.5 mg  0.5 mg Oral Q6H PRN Momina Hunton, Madie Reno, MD   0.5 mg at 10/30/17 0033  . magnesium hydroxide (MILK OF MAGNESIA) suspension 30 mL  30 mL Oral Daily PRN Mahaila Tischer, Madie Reno, MD   30 mL at 10/05/17 1731  . magnesium oxide (MAG-OX) tablet 200 mg  200 mg Oral BID Pucilowska, Jolanta B, MD   200 mg at 11/05/17 1613  . menthol-cetylpyridinium (CEPACOL) lozenge 3 mg  1 lozenge Oral Q6H PRN Lenward Chancellor, MD   3 mg at 11/04/17 1418  . midazolam (VERSED) injection 2 mg  2 mg Intravenous Once Eagle Pitta T, MD      . multivitamin with minerals tablet 1 tablet  1 tablet Oral Daily Pucilowska, Jolanta B, MD   1 tablet at 11/05/17 1131  . omega-3 acid ethyl esters (LOVAZA) capsule 1 g  1 g Oral BID Pucilowska, Jolanta B, MD   1 g at 11/05/17 1614  . ondansetron (ZOFRAN) injection 4 mg  4 mg  Intravenous Once Natilie Krabbenhoft, Madie Reno, MD      . ondansetron [facility]) injection 4 mg  4 mg Intravenous Once PRN Gunnar Fusi, MD      . ondansetron [facility]) injection 4 mg  4 mg Intravenous Once PRN Gunnar Bulla, MD      . ondansetron [facility]) injection 4 mg  4 mg Intravenous Once PRN Alvin Critchley, MD      . ondansetron [facility]) injection 4 mg  4 mg Intravenous Once  Monta Maiorana T, MD      . ondansetron [facility]) injection 4 mg  4 mg Intravenous Once PRN Alvin Critchley, MD      . ondansetron (ZOFRAN-ODT) disintegrating tablet 4 mg  4 mg Oral Q8H PRN Pucilowska, Jolanta B, MD   4 mg at 10/08/17 1807  . PARoxetine (PAXIL-CR) 24 hr tablet 25 mg  25 mg Oral Daily Moreen Piggott, Madie Reno, MD   25 mg at 11/05/17 1132  . polyethylene glycol (MIRALAX / GLYCOLAX) packet 17 g  17 g Oral Daily Pucilowska, Jolanta B, MD   17 g at 11/05/17 1132  . polyvinyl alcohol (LIQUIFILM TEARS) 1.4 % ophthalmic solution 1 drop  1 drop Both Eyes PRN Ramond Dial, MD   1 drop at 11/05/17 0146  . prazosin (MINIPRESS) capsule 2 mg  2 mg Oral QHS Lymon Kidney, Madie Reno, MD   2 mg at 11/04/17 2215  . promethazine (PHENERGAN) tablet 12.5 mg  12.5 mg Oral Q6H PRN Rodrecus Belsky T, MD   12.5 mg at 10/24/17 1234  . pyridOXINE (VITAMIN B-6) tablet 100 mg  100 mg Oral BID Saiya Crist, Madie Reno, MD   100 mg at 11/05/17 1614  . QUEtiapine (SEROQUEL) tablet 100 mg  100 mg Oral QHS Jullianna Gabor T, MD   100 mg at 11/04/17 2215  . tiZANidine (ZANAFLEX) tablet 2 mg  2 mg Oral Q8H PRN Pucilowska, Jolanta B, MD   2 mg at 11/03/17 2253  . traZODone (DESYREL) tablet 100 mg  100 mg Oral QHS Breonia Kirstein, Madie Reno, MD   100 mg at 11/04/17 2215  . triamcinolone cream (KENALOG) 0.1 %   Topical TID PRN Ramond Dial, MD   1 application at 23/53/61 2106  . vitamin C (ASCORBIC ACID) tablet 1,000 mg  1,000 mg Oral Daily Pucilowska, Jolanta B, MD   1,000 mg at 11/05/17 1133    Lab Results:  Results for orders placed or performed during the hospital encounter of 09/19/17 (from the past 48 hour(s))  Basic metabolic panel     Status: None   Collection Time: 11/05/17  7:30 AM  Result Value Ref Range   Sodium 140 135 - 145 mmol/L   Potassium 4.3 3.5 - 5.1 mmol/L   Chloride 104 98 - 111 mmol/L   CO2 29 22 - 32 mmol/L   Glucose, Bld 85 70 - 99 mg/dL   BUN 18 8 - 23 mg/dL   Creatinine, Ser 0.78 0.44 - 1.00 mg/dL   Calcium 9.0 8.9 -  10.3 mg/dL   GFR calc non Af Amer >60 >60 mL/min   GFR calc Af Amer >60 >60 mL/min    Comment: (NOTE) The eGFR has been calculated using the CKD EPI equation. This calculation has not been validated in all clinical situations. eGFR's persistently <60 mL/min signify possible Chronic Kidney Disease.    Anion gap 7 5 - 15    Comment: Performed at [facility], [address] [place], [place] [address]    Blood Alcohol  level:  Lab Results  Component Value Date   ETH <5 09/29/2016   ETH <5 76/28/3151    Metabolic Disorder Labs: Lab Results  Component Value Date   HGBA1C 5.7 (H) 09/20/2017   MPG 116.89 09/20/2017   No results found for: PROLACTIN Lab Results  Component Value Date   CHOL 173 09/20/2017   TRIG 193 (H) 09/20/2017   HDL 50 09/20/2017   CHOLHDL 3.5 09/20/2017   VLDL 39 09/20/2017   LDLCALC 84 09/20/2017   LDLCALC 87 09/04/2017    Physical Findings: AIMS: Facial and Oral Movements Muscles of Facial Expression: None, normal Lips and Perioral Area: None, normal Jaw: None, normal Tongue: None, normal,Extremity Movements Upper (arms, wrists, hands, fingers): None, normal Lower (legs, knees, ankles, toes): None, normal, Trunk Movements Neck, shoulders, hips: None, normal, Overall Severity Severity of abnormal movements (highest score from questions above): None, normal Incapacitation due to abnormal movements: None, normal Patient's awareness of abnormal movements (rate only patient's report): No Awareness, Dental Status Current problems with teeth and/or dentures?: No Does patient usually wear dentures?: No  CIWA:    COWS:     Musculoskeletal: Strength & Muscle Tone: within normal limits Gait & Station: normal Patient leans: N/A  Psychiatric Specialty Exam: Physical Exam  Nursing note and vitals reviewed. Constitutional: She appears well-developed and well-nourished.  HENT:  Head: Normocephalic and atraumatic.  Eyes: Pupils are equal,  round, and reactive to light. Conjunctivae are normal.  Neck: Normal range of motion.  Cardiovascular: Regular rhythm and normal heart sounds.  Respiratory: Effort normal.  GI: Soft.  Musculoskeletal: Normal range of motion.  Neurological: She is alert.  Skin: Skin is warm and dry.  Psychiatric: Her mood appears anxious. Her speech is tangential. She is agitated. She is not aggressive. Thought content is not paranoid. She expresses impulsivity and inappropriate judgment. She expresses no homicidal and no suicidal ideation. She exhibits abnormal recent memory.    Review of Systems  Constitutional: Negative.   HENT: Positive for sore throat.   Eyes: Negative.   Respiratory: Negative.   Cardiovascular: Negative.   Gastrointestinal: Negative.   Genitourinary: Positive for hematuria.  Musculoskeletal: Negative.   Skin: Negative.   Neurological: Negative.   Psychiatric/Behavioral: Positive for memory loss. Negative for depression, hallucinations, substance abuse and suicidal ideas. The patient is nervous/anxious. The patient does not have insomnia.     Blood pressure (!) 120/91, pulse 64, temperature 97.7 F (36.5 C), temperature source Oral, resp. rate 16, height '5\' 3"'$  (1.6 m), weight 53 kg, SpO2 97 %.Body mass index is 20.7 kg/m.  General Appearance: Casual  Eye Contact:  Fair  Speech:  Clear and Coherent  Volume:  Normal  Mood:  Dysphoric  Affect:  Congruent  Thought Process:  Goal Directed  Orientation:  Full (Time, Place, and Person)  Thought Content:  Rumination  Suicidal Thoughts:  No  Homicidal Thoughts:  No  Memory:  Immediate;   Fair Recent;   Fair Remote;   Fair  Judgement:  Impaired  Insight:  Shallow  Psychomotor Activity:  Decreased  Concentration:  Concentration: Fair  Recall:  [name]:  Fair  Language:  Fair  Akathisia:  No  Handed:  Right  AIMS (if indicated):     Assets:  Desire for Improvement  ADL's:  Impaired  Cognition:  Impaired,   Mild  Sleep:  Number of Hours: 6     Treatment Plan Summary: Daily contact with patient to assess and evaluate symptoms  and progress in treatment, Medication management and Plan I am fine with ordering the urine analysis.  Tried to point out to the patient that the rapid strep is unlikely to be useful under the circumstances but she is requesting it anyway so we will order it.  She is demanding Mucinex even though frankly I do not think she is coughing that much but we can order that as well.  I tried to impress upon her that she absolutely needs to be discharged.  She has long ago run through her benefit from being inpatient.  We will work on this as a treatment team and try and make some progress in the next couple days.  Alethia Berthold, MD 11/05/2017, 7:20 PM

## 2017-11-05 NOTE — [facility] Group Notes (Signed)
[facility] Group Notes:  (Nursing/MHT/Case Management/Adjunct)  Date:  11/05/2017  Time:  11:18 PM  Type of Therapy:  Group Therapy  Participation Level:  Active  Participation Quality:  Appropriate  Affect:  Appropriate  Cognitive:  Appropriate  Insight:  Appropriate  Engagement in Group:  Engaged  Modes of Intervention:  Discussion  Summary of Progress/Problems:  Kandis Fantasia 11/05/2017, 11:18 PM

## 2017-11-05 NOTE — Progress Notes (Signed)
   11/05/17 1840  Clinical Encounter Type  Visited With Patient;Health care provider  Visit Type Follow-up  Referral From Nurse  Consult/Referral To [place] responded to page from unit staff.  Chaplain utilized active and reflective listening as patient processed that today is father's birthday (he died last year).  Patient also began conversation surrounding her desire to make life changes and find a 'mate,' how the person she named as 'sweetie' wasn't someone who could have been her mate.  At this point, physician arrived to check in with patient.  Patient encouraged chaplain to remain which chaplain did, offering a calming presence and silent/energetic prayers.  Security came to inform patient of visitors so encounter ended.

## 2017-11-05 NOTE — Plan of Care (Signed)
Patient remains irritable regarding the "unknown" of her discharge. "I need to get some answers regarding my discharge." Patient received the number to call the group home that she has been accepted to so that she can communicate with them. Patient complained of being sick with a cold and requested her throat lozenges. Patient alert and oriented x 4. Takes medication without difficulty. Milieu remains safe with q 15 minute safety checks.

## 2017-11-05 NOTE — Progress Notes (Signed)
Recreation Therapy Notes  Date: 11/05/2017  Time: 9:30 am   Location: Craft room   Behavioral response: N/A   Intervention Topic: Stress  Discussion/Intervention: Patient did not attend group.   Clinical Observations/Feedback:  Patient did not attend group.   Valor [place] LRT/CTRS        Taaj Hurlbut 11/05/2017 12:21 PM

## 2017-11-06 DIAGNOSIS — J019 Acute sinusitis, unspecified: Secondary | ICD-10-CM | POA: Diagnosis not present

## 2017-11-06 DIAGNOSIS — B9689 Other specified bacterial agents as the cause of diseases classified elsewhere: Secondary | ICD-10-CM | POA: Diagnosis not present

## 2017-11-06 MED ORDER — POLYETHYLENE GLYCOL 3350 17 G PO PACK: 17.0000 g | PACK | Freq: Every day | ORAL | 0 refills | Status: DC

## 2017-11-06 MED ORDER — LORAZEPAM 0.5 MG PO TABS: 0.5000 mg | ORAL_TABLET | Freq: Four times a day (QID) | ORAL | 0 refills | Status: DC | PRN

## 2017-11-06 MED ORDER — ADULT MULTIVITAMIN W/MINERALS CH: 1.0000 | ORAL_TABLET | Freq: Every day | ORAL | 1 refills | Status: DC

## 2017-11-06 MED ORDER — LINACLOTIDE 290 MCG PO CAPS: 290.0000 ug | ORAL_CAPSULE | Freq: Every day | ORAL | 1 refills | Status: DC

## 2017-11-06 MED ORDER — LEVOTHYROXINE SODIUM 100 MCG PO TABS: 100.0000 ug | ORAL_TABLET | Freq: Every day | ORAL | 1 refills | Status: DC

## 2017-11-06 MED ORDER — ASCORBIC ACID 1000 MG PO TABS: 1000.0000 mg | ORAL_TABLET | Freq: Every day | ORAL | 1 refills | Status: DC

## 2017-11-06 MED ORDER — LEVALBUTEROL HCL 0.63 MG/3ML IN NEBU: 0.6300 mg | INHALATION_SOLUTION | Freq: Every day | RESPIRATORY_TRACT | 12 refills | Status: DC

## 2017-11-06 MED ORDER — CALCIUM CARBONATE ANTACID 500 MG PO CHEW: 2.0000 | CHEWABLE_TABLET | Freq: Every day | ORAL | 1 refills | Status: DC

## 2017-11-06 MED ORDER — PRAZOSIN HCL 2 MG PO CAPS: 2.0000 mg | ORAL_CAPSULE | Freq: Every day | ORAL | 1 refills | Status: DC

## 2017-11-06 MED ORDER — TRAZODONE HCL 100 MG PO TABS: 100.0000 mg | ORAL_TABLET | Freq: Every day | ORAL | 1 refills | Status: DC

## 2017-11-06 MED ORDER — ARTIFICIAL TEARS OPHTHALMIC OINT: TOPICAL_OINTMENT | Freq: Three times a day (TID) | OPHTHALMIC | 2 refills | Status: DC

## 2017-11-06 MED ORDER — POLYVINYL ALCOHOL 1.4 % OP SOLN: 1.0000 [drp] | OPHTHALMIC | 0 refills | Status: DC | PRN

## 2017-11-06 MED ORDER — TIZANIDINE HCL 2 MG PO TABS: 2.0000 mg | ORAL_TABLET | Freq: Three times a day (TID) | ORAL | 1 refills | Status: DC | PRN

## 2017-11-06 MED ORDER — GUAIFENESIN ER 600 MG PO TB12: 600.0000 mg | ORAL_TABLET | Freq: Two times a day (BID) | ORAL | 0 refills | Status: DC

## 2017-11-06 MED ORDER — MAGNESIUM OXIDE 400 (241.3 MG) MG PO TABS: 200.0000 mg | ORAL_TABLET | Freq: Two times a day (BID) | ORAL | 1 refills | Status: DC

## 2017-11-06 MED ORDER — CYCLOSPORINE 0.05 % OP EMUL: 1.0000 [drp] | Freq: Two times a day (BID) | OPHTHALMIC | 1 refills | Status: DC

## 2017-11-06 MED ORDER — FERROUS SULFATE 325 (65 FE) MG PO TABS: 325.0000 mg | ORAL_TABLET | Freq: Every day | ORAL | 1 refills | Status: DC

## 2017-11-06 MED ORDER — OMEGA-3-ACID ETHYL ESTERS 1 G PO CAPS: 1.0000 g | ORAL_CAPSULE | Freq: Two times a day (BID) | ORAL | 1 refills | Status: DC

## 2017-11-06 MED ORDER — CHOLECALCIFEROL 25 MCG (1000 UT) PO TABS: 1000.0000 [IU] | ORAL_TABLET | Freq: Every day | ORAL | 1 refills | Status: DC

## 2017-11-06 MED ORDER — PYRIDOXINE HCL 100 MG PO TABS: 100.0000 mg | ORAL_TABLET | Freq: Two times a day (BID) | ORAL | 1 refills | Status: DC

## 2017-11-06 MED ORDER — IPRATROPIUM BROMIDE 0.03 % NA SOLN: 2.0000 | Freq: Three times a day (TID) | NASAL | 12 refills | Status: DC

## 2017-11-06 MED ORDER — DEXTROMETHORPHAN POLISTIREX ER 30 MG/5ML PO SUER: 30.0000 mg | Freq: Two times a day (BID) | ORAL | 0 refills | Status: DC

## 2017-11-06 MED ORDER — QUETIAPINE FUMARATE 100 MG PO TABS: 100.0000 mg | ORAL_TABLET | Freq: Every day | ORAL | 1 refills | Status: DC

## 2017-11-06 MED ORDER — DICLOFENAC SODIUM 1 % TD GEL: 4.0000 g | Freq: Four times a day (QID) | TRANSDERMAL | 1 refills | Status: DC

## 2017-11-06 MED ORDER — PAROXETINE HCL ER 25 MG PO TB24: 25.0000 mg | ORAL_TABLET | Freq: Every day | ORAL | 1 refills | Status: DC

## 2017-11-06 MED ORDER — HYDROCORTISONE 1 % EX CREA: TOPICAL_CREAM | Freq: Two times a day (BID) | CUTANEOUS | 0 refills | Status: DC

## 2017-11-06 MED ORDER — LIDOCAINE 5 % EX PTCH: 1.0000 | MEDICATED_PATCH | CUTANEOUS | 0 refills | Status: DC

## 2017-11-06 MED ORDER — TRIAMCINOLONE ACETONIDE 0.1 % EX CREA: TOPICAL_CREAM | Freq: Three times a day (TID) | CUTANEOUS | 0 refills | Status: DC | PRN

## 2017-11-06 MED ORDER — GABAPENTIN 100 MG PO CAPS: 100.0000 mg | ORAL_CAPSULE | Freq: Two times a day (BID) | ORAL | 1 refills | Status: DC

## 2017-11-06 MED ORDER — AMLODIPINE BESYLATE 5 MG PO TABS: 5.0000 mg | ORAL_TABLET | Freq: Two times a day (BID) | ORAL | 1 refills | Status: DC

## 2017-11-06 NOTE — [facility] Group Notes (Signed)
  LCSW Group Therapy Note  Tuesday Oct 22/19 at 1:00pm  Type of Therapy/Topic:  Group Therapy:  Feelings about Diagnosis  Participation Level:  Patient did not attend- resting in room  Description of Group:   This group will allow patients to explore their thoughts and feelings about diagnoses they have received. Patients will be guided to explore their level of understanding and acceptance of these diagnoses. Facilitator will encourage patients to process their thoughts and feelings about the reactions of others to their diagnosis and will guide patients in identifying ways to discuss their diagnosis with significant others in their lives. This group will be process-oriented, with patients participating in exploration of their own experiences, giving and receiving support, and processing challenge from other group members.   Therapeutic Goals: 1. Patient will demonstrate understanding of diagnosis as evidenced by identifying two or more symptoms of the disorder 2. Patient will be able to express two feelings regarding the diagnosis 3. Patient will demonstrate their ability to communicate their needs through discussion and/or role play  Summary of Patient Progress:  Therapeutic Modalities:   Cognitive Behavioral Therapy Brief Therapy Feelings Identification    Lynnann Knudsen LCSW [id number]

## 2017-11-06 NOTE — Progress Notes (Signed)
Recreation Therapy Notes   Date: 11/06/2017  Time: 9:30 am   Location: Craft room   Behavioral response: N/A   Intervention Topic: Emotions  Discussion/Intervention: Patient did not attend group.   Clinical Observations/Feedback:  Patient did not attend group.   [place] Mones LRT/CTRS        Kien Mirsky 11/06/2017 10:40 AM

## 2017-11-06 NOTE — BHH Suicide Risk Assessment (Signed)
[facility] Discharge Suicide Risk Assessment   Principal Problem: Bipolar I disorder, most recent episode mixed, severe with psychotic features Angela [name]) Discharge Diagnoses:  Patient Active Problem List   Diagnosis Date Noted  . Bipolar I disorder, most recent episode mixed, severe with psychotic features (Angela [name]) [F31.64] 09/18/2017  . Agitation [R45.1] 09/18/2017  . IBS (irritable bowel syndrome) [K58.9] 11/17/2016  . Bilateral carpal tunnel syndrome [G56.03] 11/17/2016  . Plantar fasciitis, bilateral [M72.2] 11/17/2016  . Chronic pain syndrome [G89.4] 11/17/2016  . Chronic hyponatremia [E87.1] 11/17/2016  . PTSD (post-traumatic stress disorder) [F43.10] 11/17/2016  . Hernia, hiatal [K44.9] 11/17/2016  . Vitamin D deficiency [E55.9] 11/17/2016  . Vitamin B12 deficiency [E53.8] 11/17/2016  . SIADH (syndrome of inappropriate ADH production) (Angela [name]) [E22.2] 09/08/2016  . Cervical spondylosis with myelopathy [M47.12] 05/24/2016  . Post-concussion headache [G44.309] 05/24/2016  . MCI (mild cognitive impairment) with memory loss [G31.84] 03/30/2016  . Gait abnormality [R26.9] 03/30/2016  . Chronic bipolar affective disorder (Angela [name]) [F31.9] 01/24/2016  . Anemia, iron deficiency [D50.9] 06/24/2015  . Endometriosis [N80.9] 06/24/2015  . Essential hypertension [I10] 06/24/2015  . History of postmenopausal bleeding [Z87.42] 05/22/2015  . History of TIA (transient ischemic attack) [Z86.73] 04/20/2015  . Hypothyroidism [E03.9] 04/20/2015  . GERD (gastroesophageal reflux disease) [K21.9] 04/20/2015  . Cervical disc disorder with radiculopathy [M50.10] 03/25/2015  . Chronic neck pain [M54.2, G89.29] 03/25/2015  . Pelvic pain in female [R10.2] 10/30/2014  . History of skin cancer [Z85.828] 06/30/2014  . Chronic headaches [R51] 12/16/2013  . Neuropathy [G62.9] 12/16/2013    Total Time spent with patient: 45 minutes  Musculoskeletal: Strength & Muscle Tone: within normal limits Gait & Station:  normal Patient leans: N/A  Psychiatric Specialty Exam: Review of Systems  Constitutional: Negative.   HENT: Positive for sore throat.   Eyes: Negative.   Respiratory: Negative.   Cardiovascular: Negative.   Gastrointestinal: Negative.   Musculoskeletal: Negative.   Skin: Negative.   Neurological: Negative.   Psychiatric/Behavioral: The patient is nervous/anxious.     Blood pressure (!) 120/91, pulse 64, temperature 97.7 F (36.5 C), temperature source Oral, resp. rate 16, height 5\' 3"  (1.6 m), weight 53 kg, SpO2 97 %.Body mass index is 20.7 kg/m.  General Appearance: Fairly Groomed  Engineer, water::  Good  Speech:  Normal Rate409  Volume:  Normal  Mood:  Anxious  Affect:  Congruent  Thought Process:  Goal Directed  Orientation:  Full (Time, Place, and Person)  Thought Content:  Logical  Suicidal Thoughts:  No  Homicidal Thoughts:  No  Memory:  Immediate;   Fair Recent;   Fair Remote;   Fair  Judgement:  Fair  Insight:  Fair  Psychomotor Activity:  Normal  Concentration:  Fair  Recall:  Angela [name]:Fair  Language: Fair  Akathisia:  No  Handed:  Right  AIMS (if indicated):     Assets:  Desire for Improvement  Sleep:  Number of Hours: 5  Cognition: WNL  ADL's:  Intact   Mental Status Per Nursing Assessment::   On Admission:  Self-harm thoughts  Demographic Factors:  Caucasian and Unemployed  Loss Factors: Loss of significant relationship  Historical Factors: Impulsivity  Risk Reduction Factors:   Religious beliefs about death  Continued Clinical Symptoms:  Bipolar Disorder:   Depressive phase  Cognitive Features That Contribute To Risk:  Closed-mindedness    Suicide Risk:  Mild:  Suicidal ideation of limited frequency, intensity, duration, and specificity.  There are no identifiable plans, no associated intent,  mild dysphoria and related symptoms, good self-control (both objective and subjective assessment), few other risk factors, and  identifiable protective factors, including available and accessible social support.  Follow-up Information    [facility]. Go on 11/09/2017.   Why:  Please follow up on Friday November 09, 2017 at 8:30am. Please bring your ID, insurance information, discharge paperwork and all medications that you are taking. Thank you. Contact information: [place] [address] [phone]        [facility]. Go on 11/13/2017.   Specialty:  Behavioral Health Why:   Please follow up with [facility] for a walk in appointment on Monday-Friday at 8:30am-3:00pm by Tuesday, November 13, 2017. Please bring a copy of proof of income, insurance card and ID card. Thank You. Contact information: [name] [phone]          Plan Of Care/Follow-up recommendations:  Activity:  as tolerATED Diet:  REGULAR Other:  FOLLOW UP [place], MD 11/06/2017, 2:57 PM

## 2017-11-06 NOTE — Progress Notes (Signed)
[facility] patients friend called and she reported she will be here in 30 minutes. LCSW took message to patient and BMU nurse, they were both in the process packing.LCSW informed and thanked other Market researcher.  No further SW needs  [employer] LCSW [phone]

## 2017-11-06 NOTE — Progress Notes (Signed)
OT Cancellation Note  Patient Details Name: Angela [name] MRN: [id number] DOB: 1950/07/25   Cancelled Treatment:    Reason Eval/Treat Not Completed: Patient declined, no reason specified.  Talked with NSG prior to see patient since she was in bed sleeping and was c/o not feeling well yesterday.  RN indicated she was being discharged to a group home today and if she declined, she will be taken to a hotel.  Patient aroused and startled to voice and stated "I am just not feeling up to doing anything today. My throat still hurts."   Chrys Racer, OTR/L ascom [phone] 11/06/17, 10:24 AM

## 2017-11-06 NOTE — Progress Notes (Signed)
Recreation Therapy Notes  INPATIENT RECREATION TR PLAN  Patient Details Name: Angela [name] MRN: [id number] DOB: July 18, 1950 Today's Date: 11/06/2017  Rec Therapy Plan Is patient appropriate for Therapeutic Recreation?: Yes Treatment times per week: at least 3 Estimated Length of Stay: 5-7 days TR Treatment/Interventions: Group participation (Comment)  Discharge Criteria Pt will be discharged from therapy if:: Discharged Treatment plan/goals/alternatives discussed and agreed upon by:: Patient/family  Discharge Summary Short term goals set: Patient will demonstrate no more than 2 impulsive behaviors during groups within 5 recreation therapy group sessions Short term goals met: Not met Progress toward goals comments: Groups attended Which groups?: Self-esteem, Coping skills Reason goals not met: Patient spent most of her time in her room Therapeutic equipment acquired: N/A Reason patient discharged from therapy: Discharge from hospital Pt/family agrees with progress & goals achieved: Yes Date patient discharged from therapy: 11/06/17   Orvil Faraone 11/06/2017, 12:45 PM

## 2017-11-06 NOTE — Progress Notes (Signed)
LCSW consulted with the following to resolve patients issues of having a safe discharge plan.  As per Patient she gave this worker to contact [facility] [phone] She stated this women would pick her up, provide her with her stored items and agreeable to take her to the new facility in [place]. LCSW called and left a voice mail message with my number  In addition: If floe cant be reached the patient will then take a cab direct from [facility] to the [facility] in [place] and will stay there for one evening and then proceed to her [facility] and [place] [phone]  LCSW consulted with Dr Bary Leriche and she consulted with Dr Weber Cooks and LCSW consulted with the Director of BMU and reviewed our discharge plan.  LCSW thanked the other LCSW in BMU- for her creating this plan and for all her efforts.   Patient is to discharge today.  [employer] LCSW [phone]

## 2017-11-06 NOTE — Progress Notes (Signed)
Received Angela [name] this AM in her room asleep, he was awaken for breakfast and several times thereafter. She refused to get up out of bed. At 1130 hrs she continues to lay in the bed. She was instructed to call the group home to make arrangements for her finances. Afterwards she said the group home will not work out for her. Then she agreed to go to the [employer].    Her friend Angela [name] arrived to transport her home.

## 2017-11-06 NOTE — Progress Notes (Signed)
LCSW met with patient after she was upset at several staff members. LCSW explained that she needs to go to this facility [facility] on Wednesday AM. She identified she needs to get her things from her friends home first. LCSW explained she will be sent by cab to either her friends house or the [facility].She will then need to find transportation to this group home in [place] first thing in the morning.  LCSW consulted with BHU nurse JT and with BMU Education officer, museum.  Her BMU nurse will assist patient with packing her belongings in the donated suitcase and LCSW with support her to leave.  [employer] LCSW [phone]

## 2017-11-06 NOTE — Progress Notes (Signed)
  [facility] Adult Case Management Discharge Plan :  Will you be returning to the same living situation after discharge:  No. At discharge, do you have transportation home?: Yes,  Taxi Do you have the ability to pay for your medications: Yes,  Insurance  Release of information consent forms completed and in the chart;  Patient's signature needed at discharge.  Patient to Follow up at: Follow-up Information    [facility] [employer] [facility]. Go on 11/09/2017.   Why:  Please follow up on Friday November 09, 2017 at 8:30am. Please bring your ID, insurance information, discharge paperwork and all medications that you are taking. Thank you. Contact information: [place] [address] [phone]        [facility]. Go on 11/13/2017.   Specialty:  Behavioral Health Why:   Please follow up with [facility] for a walk in appointment on Monday-Friday at 8:30am-3:00pm by Tuesday, November 13, 2017. Please bring a copy of proof of income, insurance card and ID card. Thank You. Contact information: [place] [phone]          Next level of care provider has access to [place] and Suicide Prevention discussed: Yes,  Completed with patient. Patient refused family contact  Have you used any form of tobacco in the last 30 days? (Cigarettes, Smokeless Tobacco, Cigars, and/or Pipes): No  Has patient been referred to the Quitline?: N/A patient is not a smoker  Patient has been referred for addiction treatment: N/A  Darin Engels, LCSW 11/06/2017, 10:41 AM

## 2017-11-08 ENCOUNTER — Telehealth: Payer: Self-pay

## 2017-11-08 NOTE — Telephone Encounter (Signed)
pt called left message that you seen her in the hopital and that she is having nightmares, tired and disorantied. pt also states that she thought she was suppose to take paxil 50mg  not the 25mg 

## 2017-11-09 NOTE — Telephone Encounter (Signed)
pt called again states that she is having nightmares and is disorantied and she having memory issues

## 2017-11-11 DIAGNOSIS — R001 Bradycardia, unspecified: Secondary | ICD-10-CM | POA: Diagnosis not present

## 2017-11-11 DIAGNOSIS — F331 Major depressive disorder, recurrent, moderate: Secondary | ICD-10-CM | POA: Diagnosis not present

## 2017-11-11 DIAGNOSIS — K5900 Constipation, unspecified: Secondary | ICD-10-CM | POA: Insufficient documentation

## 2017-11-11 DIAGNOSIS — R600 Localized edema: Secondary | ICD-10-CM | POA: Insufficient documentation

## 2017-11-11 DIAGNOSIS — J40 Bronchitis, not specified as acute or chronic: Secondary | ICD-10-CM | POA: Diagnosis not present

## 2017-11-11 DIAGNOSIS — R05 Cough: Secondary | ICD-10-CM | POA: Diagnosis not present

## 2017-11-11 NOTE — ED Triage Notes (Addendum)
Pt reports that she has been started on multiple medications recently for depression, states that she was hospitalized for 2 months and given ect treatments, arrives tonight via taxi from a hotel where she has been living and is working with [facility] to get a place to stay pt states that she is coughing up green phlegm and states that she thinks the medications are making her very sleepy and out of it. Pt also states that she is impacted, reports a headache, and states that her legs are very swollen, pt's legs have a small amount of edema noted to her ankles. Pt not responding to all questions, appears to be agitated with triage process

## 2017-11-12 ENCOUNTER — Emergency Department
Admission: EM | Admit: 2017-11-12 | Discharge: 2017-11-13 | Disposition: A | Discharge status: Home / Self Care | Payer: Medicare Other | Attending: Emergency Medicine | Admitting: Emergency Medicine

## 2017-11-12 ENCOUNTER — Other Ambulatory Visit: Payer: Self-pay

## 2017-11-12 ENCOUNTER — Emergency Department: Payer: Medicare Other

## 2017-11-12 DIAGNOSIS — J40 Bronchitis, not specified as acute or chronic: Secondary | ICD-10-CM

## 2017-11-12 DIAGNOSIS — K5900 Constipation, unspecified: Secondary | ICD-10-CM | POA: Diagnosis not present

## 2017-11-12 DIAGNOSIS — R609 Edema, unspecified: Secondary | ICD-10-CM

## 2017-11-12 DIAGNOSIS — F331 Major depressive disorder, recurrent, moderate: Secondary | ICD-10-CM

## 2017-11-12 DIAGNOSIS — R05 Cough: Secondary | ICD-10-CM | POA: Diagnosis not present

## 2017-11-12 LAB — CBC
HCT: 33.7 % — ABNORMAL LOW (ref 36.0–46.0)
HEMOGLOBIN: 11.3 g/dL — AB (ref 12.0–15.0)
MCH: 30.8 pg (ref 26.0–34.0)
MCHC: 33.5 g/dL (ref 30.0–36.0)
MCV: 91.8 fL (ref 80.0–100.0)
NRBC: 0 % (ref 0.0–0.2)
Platelets: 274 10*3/uL (ref 150–400)
RBC: 3.67 MIL/uL — ABNORMAL LOW (ref 3.87–5.11)
RDW: 13.3 % (ref 11.5–15.5)
WBC: 5.5 10*3/uL (ref 4.0–10.5)

## 2017-11-12 LAB — COMPREHENSIVE METABOLIC PANEL
ALBUMIN: 4.1 g/dL (ref 3.5–5.0)
ALT: 21 U/L (ref 0–44)
ANION GAP: 8 (ref 5–15)
AST: 28 U/L (ref 15–41)
Alkaline Phosphatase: 92 U/L (ref 38–126)
BUN: 10 mg/dL (ref 8–23)
CO2: 23 mmol/L (ref 22–32)
Calcium: 9 mg/dL (ref 8.9–10.3)
Chloride: 101 mmol/L (ref 98–111)
Creatinine, Ser: 0.66 mg/dL (ref 0.44–1.00)
GFR calc Af Amer: >60 mL/min (ref >60)
GFR calc non Af Amer: >60 mL/min (ref >60)
GLUCOSE: 119 mg/dL — AB (ref 70–99)
POTASSIUM: 4 mmol/L (ref 3.5–5.1)
SODIUM: 132 mmol/L — AB (ref 135–145)
Total Bilirubin: 0.4 mg/dL (ref 0.3–1.2)
Total Protein: 7.1 g/dL (ref 6.5–8.1)

## 2017-11-12 MED ORDER — LEVALBUTEROL HCL 1.25 MG/0.5ML IN NEBU
1.2500 mg | INHALATION_SOLUTION | Freq: Once | RESPIRATORY_TRACT | Status: AC
  Administered 2017-11-12: 1.25 mg via RESPIRATORY_TRACT
  Filled 2017-11-12: qty 0.5

## 2017-11-12 MED ORDER — ALBUTEROL SULFATE (2.5 MG/3ML) 0.083% IN NEBU
2.5000 mg | INHALATION_SOLUTION | Freq: Once | RESPIRATORY_TRACT | Status: DC
  Filled 2017-11-12: qty 3

## 2017-11-12 MED ORDER — LACTULOSE 10 GM/15ML PO SOLN
30.0000 g | Freq: Once | ORAL | Status: AC
  Administered 2017-11-12: 30 g via ORAL
  Filled 2017-11-12: qty 60

## 2017-11-12 MED ORDER — HYDROCOD POLST-CPM POLST ER 10-8 MG/5ML PO SUER
5.0000 mL | Freq: Once | ORAL | Status: AC
  Administered 2017-11-12: 5 mL via ORAL
  Filled 2017-11-12: qty 5

## 2017-11-12 MED ORDER — LACTULOSE 10 GM/15ML PO SOLN: 20.0000 g | Freq: Every day | ORAL | 0 refills | Status: DC | PRN

## 2017-11-12 MED ORDER — FUROSEMIDE 40 MG PO TABS
20.0000 mg | ORAL_TABLET | Freq: Once | ORAL | Status: AC
  Administered 2017-11-12: 20 mg via ORAL
  Filled 2017-11-12: qty 1

## 2017-11-12 MED ORDER — HYDROCOD POLST-CPM POLST ER 10-8 MG/5ML PO SUER: 5.0000 mL | Freq: Two times a day (BID) | ORAL | 0 refills | Status: DC

## 2017-11-12 NOTE — ED Provider Notes (Signed)
Patient is doing well this morning.  Dr. Jimmye Norman had taken signout from Dr. Beather Arbour and then this morning I have not taken over for this patient.  Patient is actually requesting to go home.  She was initially interested in speaking voluntarily with Dr. Weber Cooks, psychiatry, but she is not reporting any ongoing symptoms concerning for emergency petition or involuntary commitment and she is requesting to go home and I think is reasonable.  After reviewing Dr. Asher Muir note, I have printed Dr. Asher Muir prepared discharge instructions as well as prescription for cough medication and lactulose for constipation.   Lisa Roca, MD 11/12/17 1131

## 2017-11-12 NOTE — Discharge Instructions (Signed)
1.  You may take Lactulose as needed for bowel movements. 2.  May take Tussionex as needed for cough. 3.  Return to the ER for worsening symptoms, persistent vomiting, difficulty breathing, feelings of hurting yourself or others, or other concerns.

## 2017-11-12 NOTE — ED Provider Notes (Signed)
[facility] Emergency Department Provider Note   ____________________________________________   First MD Initiated Contact with Patient 11/12/17 [phone]     (approximate)  I have reviewed the triage vital signs and the nursing notes.   HISTORY  Chief Complaint Cough and Weakness    HPI Angela [name] is a 67 y.o. female who presents to the ED via taxi from local motel with a chief complaint of depression, concern for polypharmacy and cough productive of green sputum.  Patient has a history of major depression who recently spent 2 months in the behavioral health unit status post ECT.  She was started on a number of psychiatric medications which she thinks is making her excessively sleepy.  She also thinks these medicines are making her constipated and making her ankle swollen.  Denies recent fever, chills, chest pain, shortness of breath, abdominal pain, nausea or vomiting.  Denies active SI/HI/AH/VH.   Past medical history Depression   There are no active problems to display for this patient.    Prior to Admission medications   Medication Sig Start Date End Date Taking? Authorizing Provider  chlorpheniramine-HYDROcodone (TUSSIONEX PENNKINETIC ER) 10-8 MG/5ML SUER Take 5 mLs by mouth 2 (two) times daily. 11/12/17   Paulette Blanch, MD  lactulose (CHRONULAC) 10 GM/15ML solution Take 30 mLs (20 g total) by mouth daily as needed for mild constipation. 11/12/17   Paulette Blanch, MD    Allergies Patient has no allergy information on record.  No family history on file.  Social History Social History   Tobacco Use  . Smoking status: Not on file  Substance Use Topics  . Alcohol use: Not on file  . Drug use: Not on file  Non-smoker  Review of Systems   Constitutional: No fever/chills Eyes: No visual changes. ENT: No sore throat. Cardiovascular: Denies chest pain. Respiratory: Positive for productive cough.  Denies shortness of  breath. Gastrointestinal: No abdominal pain.  No nausea, no vomiting.  No diarrhea.  No constipation. Genitourinary: Negative for dysuria. Musculoskeletal: Negative for back pain. Skin: Negative for rash. Neurological: Negative for headaches, focal weakness or numbness. Psychiatric: Positive for depression.  ____________________________________________   PHYSICAL EXAM:  VITAL SIGNS: ED Triage Vitals  Enc Vitals Group     BP 11/11/17 2359 121/86     Pulse Rate 11/11/17 2359 (!) 55     Resp 11/11/17 2359 16     Temp 11/11/17 2359 98.4 F (36.9 C)     Temp Source 11/11/17 2359 Oral     SpO2 11/11/17 2359 95 %     Weight 11/12/17 0000 130 lb (59 kg)     Height 11/12/17 0000 5\' 3"  (1.6 m)     Head Circumference --      Peak Flow --      Pain Score 11/11/17 2359 9     Pain Loc --      Pain Edu? --      Excl. in [place]? --     Constitutional: Alert and oriented. Well appearing and in no acute distress. Eyes: Conjunctivae are normal. PERRL. EOMI. Head: Atraumatic. Nose: No congestion/rhinnorhea. Mouth/Throat: Mucous membranes are moist.  Oropharynx non-erythematous. Neck: No stridor.   Cardiovascular: Normal rate, regular rhythm. Grossly normal heart sounds.  Good peripheral circulation. Respiratory: Normal respiratory effort.  No retractions. Lungs CTAB.  Active dry cough. Gastrointestinal: Soft and nontender. No distention. No abdominal bruits. No CVA tenderness. Musculoskeletal: No lower extremity tenderness.  1+ BLE pitting edema.  No  joint effusions. Neurologic:  Normal speech and language. No gross focal neurologic deficits are appreciated. No gait instability. Skin:  Skin is warm, dry and intact. No rash noted. Psychiatric: Mood and affect are depressed. Speech and behavior are normal.  ____________________________________________   LABS (all labs ordered are listed, but only abnormal results are displayed)  Labs Reviewed  CBC - Abnormal; Notable for the following  components:      Result Value   RBC 3.67 (*)    Hemoglobin 11.3 (*)    HCT 33.7 (*)    All other components within normal limits  COMPREHENSIVE METABOLIC PANEL - Abnormal; Notable for the following components:   Sodium 132 (*)    Glucose, Bld 119 (*)    All other components within normal limits   ____________________________________________  EKG  ED ECG REPORT I, SUNG,JADE J, the attending physician, personally viewed and interpreted this ECG.   Date: 11/12/2017  EKG Time: 2352  Rate: 57  Rhythm: sinus bradycardia  Axis: Normal  Intervals:none  ST&T Change: Nonspecific  ____________________________________________  RADIOLOGY  ED MD interpretation: No acute cardiopulmonary process  Official radiology report(s): Dg Chest 2 View  Result Date: 11/12/2017 CLINICAL DATA:  67 year old female with cough. EXAM: CHEST - 2 VIEW COMPARISON:  None. FINDINGS: There is hyperexpansion of the lungs likely related to underlying COPD and air trapping. Minimal bibasilar atelectasis. No focal consolidation, pleural effusion, or pneumothorax. The cardiac silhouette is within normal limits. There is a small hiatal hernia. No acute osseous pathology. IMPRESSION: No active cardiopulmonary disease. Electronically Signed   By: Anner Crete M.D.   On: 11/12/2017 00:57   Dg Abdomen 1 View  Result Date: 11/12/2017 CLINICAL DATA:  67 year old female with constipation. EXAM: ABDOMEN - 1 VIEW COMPARISON:  None. FINDINGS: There is moderate stool throughout the colon. No bowel dilatation or evidence of obstruction. No free air or radiopaque calculi. Right upper quadrant cholecystectomy clips. Lower abdominal/pelvic hernia repair mesh. No acute osseous pathology. IMPRESSION: Constipation. No bowel obstruction. Electronically Signed   By: Anner Crete M.D.   On: 11/12/2017 06:37    ____________________________________________   PROCEDURES  Procedure(s) performed: None  Procedures  Critical  Care performed: No  ____________________________________________   INITIAL IMPRESSION / ASSESSMENT AND PLAN / ED COURSE  As part of my medical decision making, I reviewed the following data within the [facility] notes reviewed and incorporated, Labs reviewed, Old chart reviewed, Radiograph reviewed, A consult was requested and obtained from this/these consultant(s) Psychiatry and Notes from prior ED visits   67 year old female with a history of major depression who presents with concerns for psychiatric polypharmacy, reductive cough without fever, constipation and lower leg swelling.  Clinical Course as of Nov 13 650  Mon Nov 12, 2017  2620 Updated patient on all test results.  Will administer lactulose for constipation.  Patient requesting Xopenex nebulizer treatment.  Will remain voluntarily in the ED to see in-house psychiatrist Dr. Cora Collum later this morning.  Patient care will be transferred to the oncoming provider.  Disposition per psychiatry.  If patient is discharged home, I will prepare prescriptions for Lactulose and Tussionex for her to use as needed.   [JS]    Clinical Course User Index [JS] Paulette Blanch, MD     ____________________________________________   FINAL CLINICAL IMPRESSION(S) / ED DIAGNOSES  Final diagnoses:  Bronchitis  Moderate episode of recurrent major depressive disorder (HCC)  Constipation, unspecified constipation type  Peripheral edema  ED Discharge Orders         Ordered    lactulose (CHRONULAC) 10 GM/15ML solution  Daily PRN     11/12/17 0650    chlorpheniramine-HYDROcodone (TUSSIONEX PENNKINETIC ER) 10-8 MG/5ML SUER  2 times daily     11/12/17 4097           Note:  This document was prepared using Dragon voice recognition software and may include unintentional dictation errors.    Paulette Blanch, MD 11/12/17 [phone]

## 2017-11-12 NOTE — ED Notes (Signed)
Patient given 2 glasses of ice water per request.  Will continue to monitor.

## 2017-11-12 NOTE — ED Notes (Signed)
Patient to stat desk in no acute distress at this time asking about wait time. Patient given update on wait time. Patient verbalizes understanding.  

## 2017-11-12 NOTE — ED Notes (Signed)
Dr. Jimmye Norman in room to reassess patient and discuss plan of care.

## 2017-11-12 NOTE — ED Notes (Signed)
Dr. Lord in room to reassess patient.  Will continue to monitor.   

## 2017-11-12 NOTE — Telephone Encounter (Signed)
I am afraid I must set a boundary that I cannot help this patient with ongoing management of her symptoms once she is discharged from the hospital given that I was only her inpatient provider.  She should follow-up with this as an outpatient.

## 2017-11-14 ENCOUNTER — Ambulatory Visit: Payer: Self-pay | Admitting: Internal Medicine

## 2017-11-14 ENCOUNTER — Encounter

## 2017-11-15 NOTE — Telephone Encounter (Signed)
pt called states she needs to speak with you will only speak with you.

## 2017-11-15 NOTE — Telephone Encounter (Signed)
No, I am sorry.  I am not willing to talk to this patient.  She has been discharged and is no longer under my care.  She needs to get in contact with her appropriate local providers.

## 2017-11-16 ENCOUNTER — Other Ambulatory Visit: Payer: Self-pay

## 2017-11-16 NOTE — Patient Outreach (Signed)
[facility]) Care Management  11/16/2017  Laurelyn Terrero 1950-02-17 [id number]    Telephone Screen Referral Date : 11/16/2017 Referral Source:[facility] ED Census Referral Reason: ED Utilization Insurance:[employer]  Outreach attempt # 1 To patient for screening. HIPAA verified. Discussed and offered [facility] care management services with patient. The patient stated that she did not feel comfortable going over information or completing the screening with me.  She asked for a number to call to verify who we are.  The patient was given the number [phone].  I told the patient that once she verifies the information and if she would like any assistance in the future to please give [place] a call.  She thanked me for the call.  Plan: RN Health Coach will wait ten days for return call from the patient.  I no response RN Health Coach will close the case.   [name] RN, BSN, [place] Direct Dial:  [phone] Fax: [phone]

## 2017-11-20 NOTE — Discharge Summary (Signed)
Physician Discharge Summary Note  Patient:  Angela [name] is an 67 y.o., female MRN:  [id number] DOB:  [birth year] Patient phone:  [phone] (home)  Patient address:   [address] [place] [address],  Total Time spent with patient: 45 minutes  Date of Admission:  09/19/2017 Date of Discharge: November 06, 2017  Reason for Admission: Patient was admitted through the emergency room with suicidal ideation and agitation poor self-care having been thrown out of a group home.  Very agitated disorganized mood and thinking.  History of mood instability.  Appeared to be in a manic episode with inability to care for herself.  Principal Problem: Bipolar I disorder, most recent episode mixed, severe with psychotic features [facility]) Discharge Diagnoses: Patient Active Problem List   Diagnosis Date Noted  . Bipolar I disorder, most recent episode mixed, severe with psychotic features ([place]) [F31.64] 09/18/2017  . Agitation [R45.1] 09/18/2017  . IBS (irritable bowel syndrome) [K58.9] 11/17/2016  . Bilateral carpal tunnel syndrome [G56.03] 11/17/2016  . Plantar fasciitis, bilateral [M72.2] 11/17/2016  . Chronic pain syndrome [G89.4] 11/17/2016  . Chronic hyponatremia [E87.1] 11/17/2016  . PTSD (post-traumatic stress disorder) [F43.10] 11/17/2016  . Hernia, hiatal [K44.9] 11/17/2016  . Vitamin D deficiency [E55.9] 11/17/2016  . Vitamin B12 deficiency [E53.8] 11/17/2016  . SIADH (syndrome of inappropriate ADH production) ([place]) [E22.2] 09/08/2016  . Cervical spondylosis with myelopathy [M47.12] 05/24/2016  . Post-concussion headache [G44.309] 05/24/2016  . MCI (mild cognitive impairment) with memory loss [G31.84] 03/30/2016  . Gait abnormality [R26.9] 03/30/2016  . Chronic bipolar affective disorder ([place]) [F31.9] 01/24/2016  . Anemia, iron deficiency [D50.9] 06/24/2015  . Endometriosis [N80.9] 06/24/2015  . Essential hypertension [I10] 06/24/2015  . History of postmenopausal bleeding  [Z87.42] 05/22/2015  . History of TIA (transient ischemic attack) [Z86.73] 04/20/2015  . Hypothyroidism [E03.9] 04/20/2015  . GERD (gastroesophageal reflux disease) [K21.9] 04/20/2015  . Cervical disc disorder with radiculopathy [M50.10] 03/25/2015  . Chronic neck pain [M54.2, G89.29] 03/25/2015  . Pelvic pain in female [R10.2] 10/30/2014  . History of skin cancer [Z85.828] 06/30/2014  . Chronic headaches [R51] 12/16/2013  . Neuropathy [G62.9] 12/16/2013    Past Psychiatric History: Patient has a very long history of mood disorder going back to childhood.  Multiple hospitalizations.  Patient has a long history of self-defeating behavior and noncompliance with recommended treatment which has made stability very difficult to achieve.  Multiple medications have been tried and she has claimed a large number of problems with medication.  Does have a history of potential suicidality in the past.  Past Medical History:  Past Medical History:  Diagnosis Date  . Allergic rhinitis   . Anemia   . Blurred vision   . Depression   . Diverticulosis   . Endometriosis   . Falls   . GERD (gastroesophageal reflux disease)   . Hernia, hiatal   . Hypothyroidism   . IBS (irritable bowel syndrome)   . Malignant neoplasm of skin   . Migraine   . Neuropathy   . PTSD (post-traumatic stress disorder)   . Thyroid disease     Past Surgical History:  Procedure Laterality Date  . APPENDECTOMY    . CHOLECYSTECTOMY    . CHOLECYSTECTOMY, LAPAROSCOPIC    . ESOPHAGOGASTRODUODENOSCOPY (EGD) WITH PROPOFOL N/A 03/29/2015   Procedure: ESOPHAGOGASTRODUODENOSCOPY (EGD) WITH PROPOFOL;  Surgeon: Josefine Class, MD;  Location: [facility] ENDOSCOPY;  Service: Endoscopy;  Laterality: N/A;  . HERNIA REPAIR    . laproscopy    . TONSILLECTOMY    .  uteral suspension     Family History:  Family History  Problem Relation Age of Onset  . Heart disease Father   . Cancer Mother        lung  . Urolithiasis Neg Hx   .  Kidney disease Neg Hx   . Kidney cancer Neg Hx   . Prostate cancer Neg Hx    Family Psychiatric  History: Anxiety and depression Social History:  Social History   Substance and Sexual Activity  Alcohol Use No     Social History   Substance and Sexual Activity  Drug Use No    Social History   Socioeconomic History  . Marital status: Single    Spouse name: Not on file  . Number of children: 0  . Years of education: 1.5 years of college  . Highest education level: Not on file  Occupational History  . Occupation: Retired  Scientific laboratory technician  . Financial resource strain: Not on file  . Food insecurity:    Worry: Not on file    Inability: Not on file  . Transportation needs:    Medical: Not on file    Non-medical: Not on file  Tobacco Use  . Smoking status: Former Smoker    Last attempt to quit: 11/17/1976    Years since quitting: 41.0  . Smokeless tobacco: Never Used  Substance and Sexual Activity  . Alcohol use: No  . Drug use: No  . Sexual activity: Not Currently  Lifestyle  . Physical activity:    Days per week: Not on file    Minutes per session: Not on file  . Stress: Not on file  Relationships  . Social connections:    Talks on phone: Not on file    Gets together: Not on file    Attends religious service: Not on file    Active member of club or organization: Not on file    Attends meetings of clubs or organizations: Not on file    Relationship status: Not on file  Other Topics Concern  . Not on file  Social History Narrative   Lives at home alone.   Right-handed.   No daily caffeine use.    Hospital Course: Patient was admitted to the psychiatric ward and treated with the usual groups and activities and full assessment.  Medication management was attempted.  Treatment team tried to find medicine that the patient could tolerate and benefit from.  Patient was resistant to trying mood stabilizing medicine.  Continued to have a lot of mood symptoms and agitation.   Eventually she requested ECT.  She was transferred to Dr. Weber Cooks service for ECT.  Patient received 7 ECT treatments during her hospitalization all of which were well tolerated.  Patient appeared to show some benefit with a stabilization of her mood but even with this improvement and with medication management continued to be constantly complaining of multiple somatic symptoms to be agitated on the unit frequently disruptive to the milieu.  Patient had an extended length of stay because of the difficulty finding placement.  She was uncooperative with attempts to get her into a group home and no shelter beds or friends were available.  Ultimately patient did agree to at least consider a group home placement in [place] where she was hoping to get surgery in the next month.  At the time of discharge patient was not suicidal not actively psychotic and was stabilized as best she could be on her medication.  Physical Findings: AIMS:  Facial and Oral Movements Muscles of Facial Expression: None, normal Lips and Perioral Area: None, normal Jaw: None, normal Tongue: None, normal,Extremity Movements Upper (arms, wrists, hands, fingers): None, normal Lower (legs, knees, ankles, toes): None, normal, Trunk Movements Neck, shoulders, hips: None, normal, Overall Severity Severity of abnormal movements (highest score from questions above): None, normal Incapacitation due to abnormal movements: None, normal Patient's awareness of abnormal movements (rate only patient's report): No Awareness, Dental Status Current problems with teeth and/or dentures?: No Does patient usually wear dentures?: No  CIWA:    COWS:     Musculoskeletal: Strength & Muscle Tone: within normal limits Gait & Station: normal Patient leans: N/A  Psychiatric Specialty Exam: Physical Exam  Constitutional: She appears well-developed and well-nourished.  HENT:  Head: Normocephalic and atraumatic.  Eyes: Pupils are equal, round, and  reactive to light. Conjunctivae are normal.  Neck: Normal range of motion.  Cardiovascular: Normal heart sounds.  Respiratory: Effort normal.  GI: Soft.  Musculoskeletal: Normal range of motion.  Neurological: She is alert.  Skin: Skin is warm and dry.  Psychiatric: She has a normal mood and affect. Her behavior is normal. Judgment and thought content normal.    Review of Systems  Constitutional: Negative.   HENT: Negative.   Eyes: Negative.   Respiratory: Negative.   Cardiovascular: Negative.   Gastrointestinal: Negative.   Musculoskeletal: Negative.   Skin: Negative.   Neurological: Negative.   Psychiatric/Behavioral: Negative.     Blood pressure (!) 120/91, pulse 64, temperature 97.7 F (36.5 C), temperature source Oral, resp. rate 16, height 5\' 3"  (1.6 m), weight 53 kg, SpO2 97 %.Body mass index is 20.7 kg/m.  General Appearance: Casual  Eye Contact:  Good  Speech:  Clear and Coherent  Volume:  Increased  Mood:  Anxious  Affect:  Appropriate  Thought Process:  Goal Directed  Orientation:  Full (Time, Place, and Person)  Thought Content:  Logical  Suicidal Thoughts:  No  Homicidal Thoughts:  No  Memory:  Immediate;   Fair Recent;   Fair Remote;   Fair  Judgement:  Impaired  Insight:  Shallow  Psychomotor Activity:  Normal  Concentration:  Concentration: Poor  Recall:  [place] of Knowledge:  Good  Language:  Good  Akathisia:  No  Handed:  Right  AIMS (if indicated):     Assets:  Desire for Improvement Resilience  ADL's:  Intact  Cognition:  WNL  Sleep:  Number of Hours: 5     Have you used any form of tobacco in the last 30 days? (Cigarettes, Smokeless Tobacco, Cigars, and/or Pipes): No  Has this patient used any form of tobacco in the last 30 days? (Cigarettes, Smokeless Tobacco, Cigars, and/or Pipes) Yes, No  Blood Alcohol level:  Lab Results  Component Value Date   ETH <5 09/29/2016   ETH <5 16/10/9602    Metabolic Disorder Labs:  Lab  Results  Component Value Date   HGBA1C 5.7 (H) 09/20/2017   MPG 116.89 09/20/2017   No results found for: PROLACTIN Lab Results  Component Value Date   CHOL 173 09/20/2017   TRIG 193 (H) 09/20/2017   HDL 50 09/20/2017   CHOLHDL 3.5 09/20/2017   VLDL 39 09/20/2017   LDLCALC 84 09/20/2017   LDLCALC 87 09/04/2017    See Psychiatric Specialty Exam and Suicide Risk Assessment completed by Attending Physician prior to discharge.  Discharge destination:  Other:  Patient was discharged in the company of a friend with the  intended plan that she would accept a bed at a group home in [place] but the patient does remain competent and capable of making her own living decisions.  Strongly encouraged to follow-up with local mental health.  Is patient on multiple antipsychotic therapies at discharge:  No   Has Patient had three or more failed trials of antipsychotic monotherapy by history:  No  Recommended Plan for Multiple Antipsychotic Therapies: NA  Discharge Instructions    Diet - low sodium heart healthy   Complete by:  As directed    Increase activity slowly   Complete by:  As directed      Allergies as of 11/06/2017      Reactions   Bee Venom Hives   Darvon [propoxyphene] Anaphylaxis, Hives   Hives/throat swelling   Dicyclomine Itching   Other Hives   Spider bites cause hives   Oxycodone-acetaminophen Nausea And Vomiting, Other (See Comments)   hallucinations   Peanuts [peanut Oil] Anaphylaxis, Hives   Penicillins Hives   Has patient had a PCN reaction causing immediate rash, facial/tongue/throat swelling, SOB or lightheadedness with hypotension: Unknown Has patient had a PCN reaction causing severe rash involving mucus membranes or skin necrosis: Unknown Has patient had a PCN reaction that required hospitalization: Unknown Has patient had a PCN reaction occurring within the last 10 years: Unknown If all of the above answers are "NO", then may proceed with Cephalosporin use.    Sulfa Antibiotics Anaphylaxis, Hives, Itching   Sulfacetamide Sodium Anaphylaxis, Hives, Itching   Ultram [tramadol] Hives   Amikacin Nausea And Vomiting   Aspirin Hives   Doxycycline Photosensitivity   Haloperidol Nausea And Vomiting   Hymenoptera Venom Preparations Hives   Reaction to spider bites   Imipramine Hives   Reaction to Tofranil   Keflex [cephalexin] Hives   Lactose Intolerance (gi)    Sulfur Hives   Adhesive [tape] Hives, Rash   Albuterol Palpitations   Hydroxyzine Anxiety, Itching   Peanut Butter Flavor Anxiety   Prednisone Nausea And Vomiting, Anxiety, Other (See Comments)   Crazy mood swings, strange thoughts (pt does tolerate cortisone injections)      Medication List    STOP taking these medications   ANORO ELLIPTA 62.5-25 MCG/INH Aepb Generic drug:  umeclidinium-vilanterol   calcium carbonate 600 MG Tabs tablet Commonly known as:  OS-CAL   citalopram 20 MG tablet Commonly known as:  CELEXA   conjugated estrogens vaginal cream Commonly known as:  PREMARIN   hydrocortisone 2.5 % rectal cream Commonly known as:  ANUSOL-HC   hydrOXYzine 10 MG tablet Commonly known as:  ATARAX/VISTARIL   KRILL OIL OMEGA-3 PO   levalbuterol 1.25 MG/3ML nebulizer solution Commonly known as:  XOPENEX Replaced by:  levalbuterol 0.63 MG/3ML nebulizer solution   magnesium oxide 400 MG tablet Commonly known as:  MAG-OX   montelukast 10 MG tablet Commonly known as:  SINGULAIR   omeprazole 20 MG capsule Commonly known as:  PRILOSEC   polyethylene glycol powder powder Commonly known as:  GLYCOLAX/MIRALAX Replaced by:  polyethylene glycol packet   PROBIOTIC PO   promethazine 25 MG tablet Commonly known as:  PHENERGAN   ranitidine 150 MG tablet Commonly known as:  ZANTAC   SYSTANE OP   tolterodine 2 MG 24 hr capsule Commonly known as:  DETROL LA     TAKE these medications     Indication  amLODipine 5 MG tablet Commonly known as:  NORVASC Take 1  tablet (5 mg total) by mouth 2 (two)  times daily.  Indication:  High Blood Pressure Disorder   artificial tears Oint ophthalmic ointment Commonly known as:  LACRILUBE Place into both eyes every 8 (eight) hours.  Indication:  Extremely Dry Eyes   ascorbic acid 1000 MG tablet Commonly known as:  VITAMIN C Take 1 tablet (1,000 mg total) by mouth daily.  Indication:  Inadequate Vitamin C   calcium carbonate 500 MG chewable tablet Commonly known as:  TUMS - dosed in mg elemental calcium Chew 2 tablets (400 mg of elemental calcium total) by mouth daily.  Indication:  Acid Indigestion   Cholecalciferol 1000 units tablet Take 1 tablet (1,000 Units total) by mouth daily. What changed:  how much to take  Indication:  Vitamin A Deficiency   cycloSPORINE 0.05 % ophthalmic emulsion Commonly known as:  RESTASIS Place 1 drop into both eyes 2 (two) times daily. What changed:  when to take this  Indication:  Drying and Inflammation of Cornea and Conjunctiva of Eyes   dextromethorphan 30 MG/5ML liquid Commonly known as:  DELSYM Take 5 mLs (30 mg total) by mouth 2 (two) times daily.  Indication:  Cough   diclofenac sodium 1 % Gel Commonly known as:  VOLTAREN Apply 4 g topically 4 (four) times daily. What changed:  how much to take  Indication:  Joint Damage causing Pain and Loss of Function   ferrous sulfate 325 (65 FE) MG tablet Take 1 tablet (325 mg total) by mouth daily with breakfast.  Indication:  Anemia From Inadequate Iron in the Body   gabapentin 100 MG capsule Commonly known as:  NEURONTIN Take 1 capsule (100 mg total) by mouth 2 (two) times daily.  Indication:  Abuse or Misuse of Alcohol   guaiFENesin 600 MG 12 hr tablet Commonly known as:  MUCINEX Take 1 tablet (600 mg total) by mouth 2 (two) times daily. What changed:    when to take this  reasons to take this  Indication:  Cough   hydrocortisone cream 1 % Apply topically 2 (two) times daily.  Indication:  Skin  Disease Successfully Treated with Steroid Therapy   ipratropium 0.03 % nasal spray Commonly known as:  ATROVENT Place 2 sprays into both nostrils 3 (three) times daily.  Indication:  Increased Production of Saliva   levalbuterol 0.63 MG/3ML nebulizer solution Commonly known as:  XOPENEX Take 3 mLs (0.63 mg total) by nebulization daily at 2 PM. Replaces:  levalbuterol 1.25 MG/3ML nebulizer solution  Indication:  Asthma   levothyroxine 100 MCG tablet Commonly known as:  SYNTHROID, LEVOTHROID Take 1 tablet (100 mcg total) by mouth daily before breakfast.  Indication:  Chronic Lymphocytic Thyroid Inflammation   lidocaine 5 % Commonly known as:  LIDODERM Place 1 patch onto the skin daily. Remove & Discard patch within 12 hours or as directed by MD  Indication:  Nerve Pain After Herpes Zoster or Shingles   linaclotide 290 MCG Caps capsule Commonly known as:  LINZESS Take 1 capsule (290 mcg total) by mouth daily before breakfast.  Indication:  Chronic Constipation of Unknown Cause   LORazepam 0.5 MG tablet Commonly known as:  ATIVAN Take 1 tablet (0.5 mg total) by mouth every 6 (six) hours as needed for anxiety.  Indication:  Anxiousness associated with Depression   magnesium oxide 400 (241.3 Mg) MG tablet Commonly known as:  MAG-OX Take 0.5 tablets (200 mg total) by mouth 2 (two) times daily.  Indication:  Magnesium Deficiency   multivitamin with minerals Tabs tablet Take 1 tablet by  mouth daily.  Indication:  Vitamin Deficiency   omega-3 acid ethyl esters 1 g capsule Commonly known as:  LOVAZA Take 1 capsule (1 g total) by mouth 2 (two) times daily.  Indication:  High Amount of Triglycerides in the Blood   PARoxetine 25 MG 24 hr tablet Commonly known as:  PAXIL-CR Take 1 tablet (25 mg total) by mouth daily.  Indication:  Major Depressive Disorder   polyethylene glycol packet Commonly known as:  MIRALAX / GLYCOLAX Take 17 g by mouth daily. Replaces:  polyethylene  glycol powder powder  Indication:  Constipation   polyvinyl alcohol 1.4 % ophthalmic solution Commonly known as:  LIQUIFILM TEARS Place 1 drop into both eyes as needed for dry eyes.  Indication:  Extremely Dry Eyes   prazosin 2 MG capsule Commonly known as:  MINIPRESS Take 1 capsule (2 mg total) by mouth at bedtime.  Indication:  High Blood Pressure Disorder   pyridoxine 100 MG tablet Commonly known as:  B-6 Take 1 tablet (100 mg total) by mouth 2 (two) times daily. What changed:    medication strength  how much to take  Indication:  Acute Radiation Sickness   QUEtiapine 100 MG tablet Commonly known as:  SEROQUEL Take 1 tablet (100 mg total) by mouth at bedtime.  Indication:  Depressive Phase of Manic-Depression   tiZANidine 2 MG tablet Commonly known as:  ZANAFLEX Take 1 tablet (2 mg total) by mouth every 8 (eight) hours as needed for muscle spasms. What changed:    how much to take  when to take this  Indication:  Lower Backache   traZODone 100 MG tablet Commonly known as:  DESYREL Take 1 tablet (100 mg total) by mouth at bedtime. What changed:    medication strength  how much to take  Indication:  Anxiety Disorder   triamcinolone cream 0.1 % Commonly known as:  KENALOG Apply topically 3 (three) times daily as needed.  Indication:  Allergic Contact Dermatitis      Follow-up Information    [facility] [employer] [facility]. Go on 11/09/2017.   Why:  Please follow up on Friday November 09, 2017 at 8:30am. Please bring your ID, insurance information, discharge paperwork and all medications that you are taking. Thank you. Contact information: [place] [address] [phone]        [facility]. Go on 11/13/2017.   Specialty:  Behavioral Health Why:   Please follow up with [facility] for a walk in appointment on Monday-Friday at 8:30am-3:00pm by Tuesday, November 13, 2017. Please bring a copy of  proof of income, insurance card and ID card. Thank You. Contact information: [place] [phone]          Follow-up recommendations:  Activity:  Activity as tolerated Diet:  Regular diet Other:  Follow-up local mental health  Comments: Patient was discharged with prescriptions for her medicine  Signed: Alethia Berthold, MD 11/20/2017, 6:33 PM

## 2017-11-22 ENCOUNTER — Ambulatory Visit: Payer: Self-pay | Admitting: Internal Medicine

## 2017-11-22 ENCOUNTER — Encounter: Payer: Self-pay | Admitting: Obstetrics and Gynecology

## 2017-11-23 ENCOUNTER — Encounter: Payer: Self-pay | Admitting: Podiatry

## 2017-11-23 ENCOUNTER — Other Ambulatory Visit: Payer: Self-pay | Admitting: Podiatry

## 2017-11-23 ENCOUNTER — Ambulatory Visit (INDEPENDENT_AMBULATORY_CARE_PROVIDER_SITE_OTHER): Payer: Medicare Other

## 2017-11-23 ENCOUNTER — Ambulatory Visit (INDEPENDENT_AMBULATORY_CARE_PROVIDER_SITE_OTHER): Payer: Medicare Other | Admitting: Podiatry

## 2017-11-23 DIAGNOSIS — M79676 Pain in unspecified toe(s): Secondary | ICD-10-CM

## 2017-11-23 DIAGNOSIS — M2042 Other hammer toe(s) (acquired), left foot: Secondary | ICD-10-CM

## 2017-11-23 DIAGNOSIS — R600 Localized edema: Secondary | ICD-10-CM

## 2017-11-23 DIAGNOSIS — M2041 Other hammer toe(s) (acquired), right foot: Secondary | ICD-10-CM

## 2017-11-23 DIAGNOSIS — M722 Plantar fascial fibromatosis: Secondary | ICD-10-CM

## 2017-11-23 DIAGNOSIS — M2010 Hallux valgus (acquired), unspecified foot: Secondary | ICD-10-CM

## 2017-11-23 DIAGNOSIS — B351 Tinea unguium: Secondary | ICD-10-CM | POA: Diagnosis not present

## 2017-11-25 NOTE — Progress Notes (Signed)
   SUBJECTIVE Patient presents to office today complaining of elongated, thickened nails that cause pain while ambulating in shoes. She is unable to trim her own nails. She reports significant pain to the balls of bilateral feet. She reports painful hammertoes as well. She also notes swelling in the BLE and ankles. She has been using compression anklets. There are no modifying factors noted. Patient is here for further evaluation and treatment.  Past Medical History:  Diagnosis Date  . Allergic rhinitis   . Anemia   . Blurred vision   . Depression   . Diverticulosis   . Endometriosis   . Falls   . GERD (gastroesophageal reflux disease)   . Hernia, hiatal   . Hypothyroidism   . IBS (irritable bowel syndrome)   . Malignant neoplasm of skin   . Migraine   . Neuropathy   . PTSD (post-traumatic stress disorder)   . Thyroid disease     OBJECTIVE General Patient is awake, alert, and oriented x 3 and in no acute distress. Derm Skin is dry and supple bilateral. Negative open lesions or macerations. Remaining integument unremarkable. Nails are tender, long, thickened and dystrophic with subungual debris, consistent with onychomycosis, 1-5 bilateral. No signs of infection noted. Vasc  Edema noted to the bilateral lower extremities. DP and PT pedal pulses palpable bilaterally. Temperature gradient within normal limits.  Neuro Epicritic and protective threshold sensation grossly intact bilaterally.  Musculoskeletal Exam Pain with palpation to the plantar aspect of the bilateral heels along the plantar fascia. No symptomatic pedal deformities noted bilateral. Muscular strength within normal limits.  Radiographic Exam:  Normal osseous mineralization. Joint spaces preserved. No fracture/dislocation/boney destruction.    ASSESSMENT 1. Onychodystrophic nails 1-5 bilateral with hyperkeratosis of nails.  2. Onychomycosis of nail due to dermatophyte bilateral 3. BLE edema 4. Plantar fasciitis  bilateral  PLAN OF CARE 1. Patient evaluated today. X-Rays reviewed.  2. Instructed to maintain good pedal hygiene and foot care.  3. Mechanical debridement of nails 1-5 bilaterally performed using a nail nipper. Filed with dremel without incident.  4. Continue using compression anklets bilaterally.  5. Continue using fascial braces. New fascial braces dispensed today.  6. Return to clinic as needed.    Edrick Kins, DPM [facility]  Dr. Edrick Kins, [place]                                        [place], [place] [address]                Office [phone]  Fax [phone]

## 2017-11-28 DIAGNOSIS — J019 Acute sinusitis, unspecified: Secondary | ICD-10-CM | POA: Diagnosis not present

## 2017-11-28 DIAGNOSIS — B9689 Other specified bacterial agents as the cause of diseases classified elsewhere: Secondary | ICD-10-CM | POA: Diagnosis not present

## 2017-11-28 DIAGNOSIS — J209 Acute bronchitis, unspecified: Secondary | ICD-10-CM | POA: Diagnosis not present

## 2017-11-28 DIAGNOSIS — R05 Cough: Secondary | ICD-10-CM | POA: Diagnosis not present

## 2017-11-28 DIAGNOSIS — J9801 Acute bronchospasm: Secondary | ICD-10-CM | POA: Diagnosis not present

## 2017-11-29 ENCOUNTER — Other Ambulatory Visit: Payer: Self-pay

## 2017-11-29 NOTE — Patient Outreach (Signed)
[facility] Care Management  11/29/2017  Angela [name] 1950/08/12 [id number]    Multiple attempts to establish contact with patient without success. No response from calls or letter mailed to patient. Case is being closed at this time.  [name] RN, BSN, [place] Direct Dial:  [phone]  Fax: [phone]

## 2017-12-04 DIAGNOSIS — G5603 Carpal tunnel syndrome, bilateral upper limbs: Secondary | ICD-10-CM | POA: Diagnosis not present

## 2017-12-05 DIAGNOSIS — H04123 Dry eye syndrome of bilateral lacrimal glands: Secondary | ICD-10-CM | POA: Diagnosis not present

## 2017-12-07 ENCOUNTER — Ambulatory Visit: Payer: Self-pay

## 2017-12-07 ENCOUNTER — Other Ambulatory Visit (HOSPITAL_COMMUNITY)
Admission: RE | Admit: 2017-12-07 | Discharge: 2017-12-07 | Disposition: A | Discharge status: Home / Self Care | Payer: Medicare Other | Source: Ambulatory Visit | Attending: Family Medicine | Admitting: Family Medicine

## 2017-12-07 ENCOUNTER — Ambulatory Visit (INDEPENDENT_AMBULATORY_CARE_PROVIDER_SITE_OTHER): Payer: Medicare Other

## 2017-12-07 ENCOUNTER — Ambulatory Visit (INDEPENDENT_AMBULATORY_CARE_PROVIDER_SITE_OTHER): Payer: Medicare Other | Admitting: Family Medicine

## 2017-12-07 ENCOUNTER — Encounter: Payer: Self-pay | Admitting: Family Medicine

## 2017-12-07 VITALS — BP 118/68 | HR 83 | Temp 97.9°F | Ht 63.0 in | Wt 133.4 lb

## 2017-12-07 DIAGNOSIS — R7309 Other abnormal glucose: Secondary | ICD-10-CM

## 2017-12-07 DIAGNOSIS — Z5900 Homelessness unspecified: Secondary | ICD-10-CM

## 2017-12-07 DIAGNOSIS — Z8342 Family history of familial hypercholesterolemia: Secondary | ICD-10-CM | POA: Insufficient documentation

## 2017-12-07 DIAGNOSIS — Z598 Other problems related to housing and economic circumstances: Secondary | ICD-10-CM

## 2017-12-07 DIAGNOSIS — N898 Other specified noninflammatory disorders of vagina: Secondary | ICD-10-CM | POA: Diagnosis present

## 2017-12-07 DIAGNOSIS — D500 Iron deficiency anemia secondary to blood loss (chronic): Secondary | ICD-10-CM | POA: Diagnosis not present

## 2017-12-07 DIAGNOSIS — F319 Bipolar disorder, unspecified: Secondary | ICD-10-CM

## 2017-12-07 DIAGNOSIS — I10 Essential (primary) hypertension: Secondary | ICD-10-CM | POA: Diagnosis not present

## 2017-12-07 DIAGNOSIS — M19041 Primary osteoarthritis, right hand: Secondary | ICD-10-CM

## 2017-12-07 DIAGNOSIS — Z599 Problem related to housing and economic circumstances, unspecified: Secondary | ICD-10-CM

## 2017-12-07 DIAGNOSIS — M19042 Primary osteoarthritis, left hand: Secondary | ICD-10-CM

## 2017-12-07 DIAGNOSIS — Z113 Encounter for screening for infections with a predominantly sexual mode of transmission: Secondary | ICD-10-CM

## 2017-12-07 DIAGNOSIS — E039 Hypothyroidism, unspecified: Secondary | ICD-10-CM

## 2017-12-07 DIAGNOSIS — G894 Chronic pain syndrome: Secondary | ICD-10-CM

## 2017-12-07 DIAGNOSIS — E559 Vitamin D deficiency, unspecified: Secondary | ICD-10-CM

## 2017-12-07 DIAGNOSIS — E538 Deficiency of other specified B group vitamins: Secondary | ICD-10-CM

## 2017-12-07 DIAGNOSIS — R8290 Unspecified abnormal findings in urine: Secondary | ICD-10-CM | POA: Diagnosis not present

## 2017-12-07 DIAGNOSIS — E222 Syndrome of inappropriate secretion of antidiuretic hormone: Secondary | ICD-10-CM

## 2017-12-07 DIAGNOSIS — Z5181 Encounter for therapeutic drug level monitoring: Secondary | ICD-10-CM | POA: Insufficient documentation

## 2017-12-07 DIAGNOSIS — M19049 Primary osteoarthritis, unspecified hand: Secondary | ICD-10-CM | POA: Insufficient documentation

## 2017-12-07 LAB — POCT URINALYSIS DIPSTICK
BILIRUBIN UA: NEGATIVE
Blood, UA: NEGATIVE
Glucose, UA: NEGATIVE
KETONES UA: NEGATIVE
Leukocytes, UA: NEGATIVE
NITRITE UA: NEGATIVE
ODOR: NORMAL
PH UA: 5.5 (ref 5.0–8.0)
PROTEIN UA: NEGATIVE
Spec Grav, UA: 1.02 (ref 1.010–1.025)
UROBILINOGEN UA: 0.2 U/dL

## 2017-12-07 MED ORDER — PERMETHRIN 5 % EX CREA: 1.0000 "application " | TOPICAL_CREAM | Freq: Every day | CUTANEOUS | 0 refills | Status: DC | PRN

## 2017-12-07 NOTE — Patient Instructions (Signed)
1. Thank you for allowing the CCM Team assist you with your healthcare and social needs. It was a pleasure speaking with you today. 2. Please attempt to call local housing resources we discussed today. 3. CCM RN CM will follow up with you next week to discuss any possible options for housing including utilization of shelters in [place] 4. Please contact the CCM Team with any questions/concerns/needs  CCM (Chronic Care Management) Team   Trish Fountain RN, BSN Nurse Care Coordinator  [phone]  [name] PharmD  Clinical Pharmacist  [phone]   Goals Addressed    . "I am homeless and will run out of money for a hotel by next  Wednesday" (pt-stated)        Clinical Goals: Over the next 5 days, patient will report a plan for emergiency housing  Interventions: Collaborated with [place] for housing resource options, discussed with patient options for housing assistance and emergency housing: RN CM called the following resources on patients behalf  [place]- Patient unable to utilize services per Gillermo Murdoch related to patient previous encounters.  Doctor, general practice- (called for additional resources) NO ANSWER  Senior Housing Options: [address]-closed at [employer] at [employer]. [place] was given information about Chronic Care Management services today including:  1. CCM service includes personalized support from designated clinical staff supervised by her physician, including individualized plan of care and coordination with other care providers 2. 24/7 contact phone numbers for assistance for urgent and routine care needs. 3. Service will only be billed when office clinical staff spend 20 minutes or more in a month to coordinate care. 4. Only one practitioner may furnish and bill the service in a calendar month. 5. The patient may stop CCM services at any time (effective at the end of the month) by phone call to  the office staff. 6. The patient will be responsible for cost sharing (co-pay) of up to 20% of the service fee (after annual deductible is met).  Patient agreed to services and verbal consent obtained.  The patient verbalized understanding of instructions provided today and declined a print copy of patient instruction materials.

## 2017-12-07 NOTE — Assessment & Plan Note (Signed)
Check level and replace if needed 

## 2017-12-07 NOTE — Assessment & Plan Note (Signed)
Treated earlier by Dr. Peggye Ley; concern for possible RA given family hx (grandmother)

## 2017-12-07 NOTE — Patient Instructions (Addendum)
Please do contact your lung doctor about your bronchitis, cough, respiratory symptoms He can also talk to about sleep apnea and whether or not a sleep study is a good idea  Let's get labs today  I'll contact you at [phone] for any worrisome labs, so do check your phone; thank you for letting me leave a message on your private phone if needed

## 2017-12-07 NOTE — Assessment & Plan Note (Signed)
Check CBC and iron panel today.  

## 2017-12-07 NOTE — Progress Notes (Signed)
BP 118/68   Pulse 83   Temp 97.9 F (36.6 C)   Ht 5\' 3"  (1.6 m)   Wt 133 lb 6.4 oz (60.5 kg)   SpO2 98%   BMI 23.63 kg/m    Subjective:    Patient ID: Angela [name], female    DOB: Aug 03, 1950, 67 y.o.   MRN: [id number]  HPI: Angela [name] is a 67 y.o. female  Chief Complaint  Patient presents with  . New Patient (Initial Visit)    Homeless    HPI Patient is here establishing care She sees Dr. Raul Del, pulmonologist; she has chronic sinusitis and COPD and bronchitis; he has treated her ever since she's come to [facility], about 15 years Recent flare of bronchitis; "it's terrible"; she has phlegm and congestion; no matter she does, she cannot clear her lungs; she is coughing up gobs of phlegm  She has been hospitalized for two months for grief, depression; family disowned her, she lost her house; they gave her shock treatments for depression  She thinks she needs a sleep apnea test; her father had sleep apnea and COPD  No recent physical, mammogram, pap smear, any of that; she does see Dr. Marcelline Mates for well woman exams  She has been on antibiotics; she had bad yucky smelly pee and vaginal discharge  She wants a cholestereol panel  Bad arthritis in her hands; they are getting extremely painful; she has had xrays of the hands in the last few years; she had been a pianist; she fell in the last two years, so many times; has carpal tunnel syndrome too; she has been on medicines that she thinks can cause dizziness  HTN; she has had "terrifying high" blood pressure for years  She has the inhaler Proair and is concerns about the side effects, showed me the high-lighted section of tachycardia  She has been on cipro and biaxin and z-paks; she cannot take the cipro because of tendonitis; she gets bad tendonopathy if she takes cipro  Depression screen [facility] 2/9 12/07/2017 11/17/2016 11/17/2016  Decreased Interest 3 3 3   Down, Depressed, Hopeless 3 3 3   PHQ - 2 Score 6 6 6    Altered sleeping 3 3 3   Tired, decreased energy 3 3 3   Change in appetite 3 3 3   Feeling bad or failure about yourself  3 3 3   Trouble concentrating 3 3 3   Moving slowly or fidgety/restless 0 0 0  Suicidal thoughts 0 0 0  PHQ-9 Score 21 21 21   Difficult doing work/chores - Somewhat difficult Very difficult  Some recent data might be hidden   Fall Risk  12/07/2017 11/17/2016  Falls in the past year? 1 Yes  Number falls in past yr: 1 -  Injury with Fall? 1 -  Risk for fall due to : - History of fall(s)    Relevant past medical, surgical, family and social history reviewed Past Medical History:  Diagnosis Date  . Allergic rhinitis   . Anemia   . Blurred vision   . Depression   . Diverticulosis   . Endometriosis   . Falls   . GERD (gastroesophageal reflux disease)   . Hernia, hiatal   . Hypothyroidism   . IBS (irritable bowel syndrome)   . Malignant neoplasm of skin   . Migraine   . Neuropathy   . PTSD (post-traumatic stress disorder)   . Thyroid disease    Past Surgical History:  Procedure Laterality Date  . APPENDECTOMY    .  CHOLECYSTECTOMY    . CHOLECYSTECTOMY, LAPAROSCOPIC    . ESOPHAGOGASTRODUODENOSCOPY (EGD) WITH PROPOFOL N/A 03/29/2015   Procedure: ESOPHAGOGASTRODUODENOSCOPY (EGD) WITH PROPOFOL;  Surgeon: Josefine Class, MD;  Location: [facility] ENDOSCOPY;  Service: Endoscopy;  Laterality: N/A;  . HERNIA REPAIR    . laproscopy    . TONSILLECTOMY    . uteral suspension     Family History  Problem Relation Age of Onset  . Heart disease Father   . Hearing loss Father   . COPD Father   . Depression Father   . Stroke Father   . Vision loss Father   . Varicose Veins Father   . Cancer Mother        lung  . Depression Sister   . Arthritis Sister   . Arthritis Brother   . Hernia Brother   . Anxiety disorder Brother   . Arthritis Brother   . Hernia Brother   . Anxiety disorder Brother   . Urolithiasis Neg Hx   . Kidney disease Neg Hx   . Kidney cancer  Neg Hx   . Prostate cancer Neg Hx    Social History   Tobacco Use  . Smoking status: Former Smoker    Last attempt to quit: 11/17/1976    Years since quitting: 41.0  . Smokeless tobacco: Never Used  Substance Use Topics  . Alcohol use: No  . Drug use: No     Office Visit from 12/07/2017 in [facility]  AUDIT-C Score  0      Interim medical history since last visit reviewed. Allergies and medications reviewed  Review of Systems Per HPI unless specifically indicated above     Objective:    BP 118/68   Pulse 83   Temp 97.9 F (36.6 C)   Ht 5\' 3"  (1.6 m)   Wt 133 lb 6.4 oz (60.5 kg)   SpO2 98%   BMI 23.63 kg/m   Wt Readings from Last 3 Encounters:  12/07/17 133 lb 6.4 oz (60.5 kg)  11/12/17 130 lb (59 kg)  09/17/17 130 lb (59 kg)    Physical Exam  Constitutional: She appears well-developed and well-nourished. No distress.  HENT:  Head: Normocephalic and atraumatic.  Eyes: EOM are normal. No scleral icterus.  Neck: No thyromegaly present.  Cardiovascular: Normal rate, regular rhythm and normal heart sounds.  No murmur heard. Pulmonary/Chest: Effort normal and breath sounds normal. No respiratory distress. She has no wheezes.  Abdominal: Soft. Bowel sounds are normal. She exhibits no distension.  Musculoskeletal: She exhibits no edema.  Neurological: She is alert.  Skin: Skin is warm and dry. She is not diaphoretic. No pallor.  Psychiatric: She has a normal mood and affect. Her behavior is normal. Judgment and thought content normal.       Assessment & Plan:   Problem List Items Addressed This Visit      Cardiovascular and Mediastinum   Essential hypertension   Relevant Orders   Lipid panel (Completed)     Endocrine   SIADH (syndrome of inappropriate ADH production) ([place])    Check Na+      Hypothyroidism    Check TSH      Relevant Orders   TSH     Musculoskeletal and Integument   Arthritis of hand, degenerative    Treated  earlier by Dr. Peggye Ley; concern for possible RA given family hx (grandmother)      Relevant Orders   ANA,IFA RA Diag Pnl w/rflx Tit/Patn  Other   Chronic bipolar affective disorder ([place]) - Primary   Relevant Orders   Ambulatory referral to Chronic Care Management Services   Ambulatory referral to Psychiatry   Vitamin D deficiency    Check level and replace as needed      Relevant Orders   VITAMIN D 25 Hydroxy (Vit-D Deficiency, Fractures) (Completed)   Vitamin B12 deficiency    Check level and replace if needed      Relevant Orders   Vitamin B12 (Completed)   Medication monitoring encounter    Check liver      Relevant Orders   COMPLETE METABOLIC PANEL WITH GFR (Completed)   Family history of high cholesterol    Check lipid panel      Relevant Orders   Lipid panel (Completed)   Anemia, iron deficiency    Check CBC and iron panel today      Relevant Orders   CBC with Differential/Platelet (Completed)   Iron, TIBC and Ferritin Panel (Completed)    Other Visit Diagnoses    Homeless       Relevant Orders   Ambulatory referral to Chronic Care Management Services   Financial difficulties       Relevant Orders   Ambulatory referral to Chronic Care Management Services   Abnormal urine odor       check urine today; antibiotic only if infection   Relevant Orders   POCT Urinalysis Dipstick (Completed)   Vaginal discharge       self swab for vaginal infections; results pending   Relevant Orders   Cervicovaginal ancillary only (Completed)   High glucose       check glucose, A1c   Relevant Orders   Hemoglobin A1c (Completed)   Screen for STD (sexually transmitted disease)       check STD labs   Relevant Orders   HIV antibody (with reflex) (Completed)       Follow up plan: Return in about 5 days (around 12/12/2017) for follow-up visit with Dr. Sanda Klein, okay for early afternoon appt at 1:00 or 1:20 pm.  An after-visit summary was printed and given to the patient  at [place].  Please see the patient instructions which may contain other information and recommendations beyond what is mentioned above in the assessment and plan.  Meds ordered this encounter  Medications  . permethrin (ELIMITE) 5 % cream    Sig: Apply 1 application topically daily as needed.    Dispense:  60 g    Refill:  0    Orders Placed This Encounter  Procedures  . HIV antibody (with reflex)  . CBC with Differential/Platelet  . COMPLETE METABOLIC PANEL WITH GFR  . Lipid panel  . Hemoglobin A1c  . VITAMIN D 25 Hydroxy (Vit-D Deficiency, Fractures)  . Vitamin B12  . Iron, TIBC and Ferritin Panel  . ANA,IFA RA Diag Pnl w/rflx Tit/Patn  . TSH  . TSH  . Ambulatory referral to Chronic Care Management Services  . Ambulatory referral to Psychiatry  . POCT Urinalysis Dipstick   Face-to-face time with patient was more than 45 minutes, >50% time spent counseling and coordination of care

## 2017-12-07 NOTE — Progress Notes (Signed)
Please let patient know that her urinalysis was normal (if she doesn't already know); the vaginal swab test will take a day or two

## 2017-12-07 NOTE — Assessment & Plan Note (Signed)
Check liver

## 2017-12-07 NOTE — Chronic Care Management (AMB) (Signed)
  Chronic Care Management Note    67 y.o. year old female referred to Chronic Care Management by Dr. Enid Derry for Chronic Case Management and Care Coordination service specifically related to patients social disparities. It is reported that patient currently is living in a hotel and would like other options/resources for shelter. Chronic conditions include HTN, SIADH, Mild Cognitive Impairment with memory loss, chronic pain syndrome and Chronic bipolar affective disorder. Last office visit with Arnetha Courser, MD was today, 12/07/17.   Was unable to reach patient via telephone today for introduction of services provided by the CCM Team. I have left HIPAA compliant voicemail asking patient to return my call. (unsuccessful outreach #1).  Plan: Will follow-up within 3-5  business days via telephone.     Beena Catano E. Rollene Rotunda, RN, BSN Nurse Care Coordinator [facility] Care Management  [phone]

## 2017-12-07 NOTE — Assessment & Plan Note (Signed)
Check Na+

## 2017-12-07 NOTE — Assessment & Plan Note (Signed)
Check level and replace as needed

## 2017-12-07 NOTE — Chronic Care Management (AMB) (Signed)
Chronic Care Management   Initial Visit Note  12/07/2017 Name: Aleli Navedo MRN: [id number] DOB: [birth year]  Referred by: Arnetha Courser, MD Reason for referral : Chronic Care Management (financial insecurities/homelessness)   Subjective: "I am going to run out of money for the motel by next Wednesday"  Objective:  Assessment: Ms. Myana Schlup is a 67 year old patient of Dr. Enid Derry who was referred today to Chronic Care Management as a "stat referral" related to homelessness. I spoke with Ms. Haywood at length about her situation.   Goals Addressed    . "I am homeless and will run out of money for a hotel by next  Wednesday" (pt-stated)   Mr. Wiens states she is currently "living" at a hotel, the [address] in [place] and her rate is 72.00/night. She states she only has enough money to cover her nightly rate until Tuesday of next week. She has no friends that can help her and she is not in contact with her family related to her mental health issues and "they think I need help" She receives 1037.00/month in social security and received notification today that her monthly trust was increased from 300.00 to 700.00/month. She states her total income will now be approximately 1700.00. She would like to find an income based apartment however she states she has been told in the past that her 700.00/month trust income could not be utilized as income on apartment applications. She states she is aware of  most income housing facilities given by RN CM. She is not interested in being given numbers to call. "I need someone to do it for me because they are not going to return my calls"  Ms. Rouse was immediately offered emergency housing resources from [employer] but states she "has been kicked out and can't go back"  She shares a long mental health history with this RN CM including "institulization since I was a child". She states she has been in multiple  group homes in [place] (she voices negativity and injustice towards her in every living environment). She admits to being evicted several times.She states she did not have her own gardianship until recently when she had a doctor attest her competency. She states she was recently hospitalized at [facility].  CCM RN CM follow up with Gillermo Murdoch, [employer] personal who is very familiar with Ms. Leyda. Unfortunately, Ms. Toohey is no longer eligible for assistance from [employer] emergency housing or assistance with permanent housing related to poor choices made by patient regarding housing and behavior, when [employer] has attempted to assist on multiple levels, including placement into group homes.          Clinical Goals: Over the next 5 days, patient will report a plan for emergiency housing  Interventions: Collaborated with [place] for housing resource options, discussed with patient options for housing assistance and emergency housing: RN CM called the following resources on patients behalf.  [employer]- Patient unable to utilize services per Gillermo Murdoch related to patient previous encounters.  Doctor, general practice- (called for additional resources) NO ANSWER  Senior Housing Options: [address]-closed at [employer] at [place]: [place] CM will continue to explore resources and follow up with patient on Monday.   Ms. Eustache was given information about Chronic Care Management services today including:  1. CCM service includes personalized support from designated clinical  staff supervised by her physician, including individualized plan of care and coordination with other care providers 2. 24/7 contact phone numbers for assistance for urgent and routine care needs. 3. Service will only be billed when office clinical staff spend 20 minutes or more in a month to coordinate care. 4. Only  one practitioner may furnish and bill the service in a calendar month. 5. The patient may stop CCM services at any time (effective at the end of the month) by phone call to the office staff. 6. The patient will be responsible for cost sharing (co-pay) of up to 20% of the service fee (after annual deductible is met).  Patient agreed to services and verbal consent obtained.    Lexxie Winberg E. Rollene Rotunda, RN, BSN Nurse Care Coordinator [facility] Care Management  [phone]

## 2017-12-07 NOTE — Assessment & Plan Note (Signed)
Check TSH 

## 2017-12-07 NOTE — Assessment & Plan Note (Signed)
Check lipid panel  

## 2017-12-08 ENCOUNTER — Other Ambulatory Visit: Payer: Self-pay | Admitting: Family Medicine

## 2017-12-08 MED ORDER — OMEGA-3-ACID ETHYL ESTERS 1 G PO CAPS: 2.0000 g | ORAL_CAPSULE | Freq: Two times a day (BID) | ORAL | 5 refills | Status: DC

## 2017-12-08 NOTE — Progress Notes (Signed)
Stop flax seed Increase lovaza

## 2017-12-10 ENCOUNTER — Telehealth: Payer: Self-pay

## 2017-12-10 ENCOUNTER — Other Ambulatory Visit: Payer: Self-pay | Admitting: Family Medicine

## 2017-12-10 ENCOUNTER — Ambulatory Visit (INDEPENDENT_AMBULATORY_CARE_PROVIDER_SITE_OTHER): Payer: Medicare Other

## 2017-12-10 DIAGNOSIS — Z5900 Homelessness unspecified: Secondary | ICD-10-CM

## 2017-12-10 DIAGNOSIS — M19041 Primary osteoarthritis, right hand: Secondary | ICD-10-CM

## 2017-12-10 DIAGNOSIS — Z598 Other problems related to housing and economic circumstances: Secondary | ICD-10-CM

## 2017-12-10 DIAGNOSIS — I10 Essential (primary) hypertension: Secondary | ICD-10-CM | POA: Diagnosis not present

## 2017-12-10 DIAGNOSIS — F319 Bipolar disorder, unspecified: Secondary | ICD-10-CM

## 2017-12-10 DIAGNOSIS — M19042 Primary osteoarthritis, left hand: Secondary | ICD-10-CM

## 2017-12-10 DIAGNOSIS — E039 Hypothyroidism, unspecified: Secondary | ICD-10-CM | POA: Diagnosis not present

## 2017-12-10 DIAGNOSIS — Z599 Problem related to housing and economic circumstances, unspecified: Secondary | ICD-10-CM

## 2017-12-10 LAB — CERVICOVAGINAL ANCILLARY ONLY
Bacterial vaginitis: NEGATIVE
Candida vaginitis: NEGATIVE
TRICH (WINDOWPATH): NEGATIVE

## 2017-12-10 MED ORDER — OMEGA-3-ACID ETHYL ESTERS 1 G PO CAPS: 2.0000 g | ORAL_CAPSULE | Freq: Two times a day (BID) | ORAL | 5 refills | Status: DC

## 2017-12-10 NOTE — Chronic Care Management (AMB) (Signed)
Chronic Care Management   Follow Up Note   12/10/2017 Name: Angela [name] MRN: [id number] DOB: 1950/11/12  Referred by: Arnetha Courser, MD Reason for referral : Chronic Care Management (housing/BH resources)    Subjective: "Have you found me help yet"   Objective:   Assessment: [name] is a 67 year old patient of Dr. Enid Derry who was referred 12/07/17 to Chronic Care Management as a "stat referral" related to homelessness. I spoke with [name] at length about her situation and have had multiple telephone conversations with her today.  Goals Addressed    . "I am homeless and will run out of money for a hotel by next  Wednesday" (pt-stated)       CCM Clinic RN CM received an incoming call from [name] this morning asking if RN CM had any success locating monetary or housing resources. She still indicates that she will not be able to remain in the hotel after Wednesday related to her exhausting her entire monthly income. Informed patient that RN CM continued to reach out to resources via telephone. Patient has not back up plan. RN CM suggested that patient utilize [employer] Patient refused stating her doctors (PCP, Pulmonologist, and Restaurant manager, fast food) are in [place]. Patient unwilling to discuss. Encouraged patient to contact family for assistance however patient states they would not help her. Left messages at multiple agencies including mental health facilities for additional resources as patient has not followed up as instructed since recent inpatient [facility] stay (discharged 11/06/17). Pt also instructed to call local churches she has previously used to see if funds were available. Patient states she has left messages with multiple churches and the attorney overseeing her trust. She has left messages requesting a call back. As a last effort, RN CM contacted [facility] [place] (patient currently resides in hotel in [place]). This RN CM feels  patient is not making choices that are appropriated related to the lack of her mental health follow up. Patient is not a candidate for a referral to [facility] secondary to immediate needs of housing and establishment of [facility] outpatient follow up. Will refer once immediate needs have been met.  Clinical Goals: Over the next 5 days, patient will report a plan for emergiency housing  Interventions: Collaborated with [place] for housing resource options, discussed with patient options for housing assistance and emergency housing: RN CM called the following resources on patients behalf  [place]- Patient unable to utilize services per Gillermo Murdoch related to patient previous encounters.  Doctor, general practice- (called for additional resources) NO ANSWER  Senior Housing Options: [address]-closed at [employer] at [employer]  12/10/17 Multiple housing, financial, and behavioral health resources contacted today with NO resolutions to patients housing crisis (see telephone call initiation tab). APS report filed with [facility] as patient is "living in [place]"  [facility]-Not taking any new patients in [place]-Will not accept patient related to previous history with patient [employer]-left message [place]-Does not take patients UHC Medicare (primary insurance) [place] takes [facility] Medicare PPO not HMO [facility] [place] message [employer] message      Plan: Will follow up with patient via telephone tomorrow and continue to explore options if available for housing and outpatient mental health follow up    [place]. Rollene Rotunda, RN, BSN Nurse Care Coordinator [facility] / [place] Management  [phone]  [phone]  

## 2017-12-10 NOTE — Telephone Encounter (Signed)
Copied from [place] [id number]. Topic: General - Other >> Dec 10, 2017 12:31 PM Oneta Rack wrote: Relation to pt: self  Call back number:[phone]   Reason for call:  Patient returning Benedetto Goad, RN call

## 2017-12-11 ENCOUNTER — Ambulatory Visit: Payer: Self-pay

## 2017-12-11 DIAGNOSIS — Z5900 Homelessness unspecified: Secondary | ICD-10-CM

## 2017-12-11 DIAGNOSIS — Z598 Other problems related to housing and economic circumstances: Secondary | ICD-10-CM

## 2017-12-11 DIAGNOSIS — M9901 Segmental and somatic dysfunction of cervical region: Secondary | ICD-10-CM | POA: Diagnosis not present

## 2017-12-11 DIAGNOSIS — M9902 Segmental and somatic dysfunction of thoracic region: Secondary | ICD-10-CM | POA: Diagnosis not present

## 2017-12-11 DIAGNOSIS — I10 Essential (primary) hypertension: Secondary | ICD-10-CM

## 2017-12-11 DIAGNOSIS — M5033 Other cervical disc degeneration, cervicothoracic region: Secondary | ICD-10-CM | POA: Diagnosis not present

## 2017-12-11 DIAGNOSIS — M19041 Primary osteoarthritis, right hand: Secondary | ICD-10-CM | POA: Diagnosis not present

## 2017-12-11 DIAGNOSIS — M19042 Primary osteoarthritis, left hand: Secondary | ICD-10-CM | POA: Diagnosis not present

## 2017-12-11 DIAGNOSIS — M5412 Radiculopathy, cervical region: Secondary | ICD-10-CM | POA: Diagnosis not present

## 2017-12-11 DIAGNOSIS — Z599 Problem related to housing and economic circumstances, unspecified: Secondary | ICD-10-CM

## 2017-12-11 DIAGNOSIS — G3184 Mild cognitive impairment, so stated: Secondary | ICD-10-CM

## 2017-12-11 DIAGNOSIS — R0609 Other forms of dyspnea: Secondary | ICD-10-CM | POA: Diagnosis not present

## 2017-12-11 DIAGNOSIS — F319 Bipolar disorder, unspecified: Secondary | ICD-10-CM

## 2017-12-11 LAB — IRON,TIBC AND FERRITIN PANEL
%SAT: 24 % (calc) (ref 16–45)
Ferritin: 48 ng/mL (ref 16–288)
IRON: 76 ug/dL (ref 45–160)
TIBC: 311 ug/dL (ref 250–450)

## 2017-12-11 LAB — COMPLETE METABOLIC PANEL WITH GFR
AG Ratio: 1.7 (calc) (ref 1.0–2.5)
ALBUMIN MSPROF: 4 g/dL (ref 3.6–5.1)
ALT: 17 U/L (ref 6–29)
AST: 17 U/L (ref 10–35)
Alkaline phosphatase (APISO): 97 U/L (ref 33–130)
BUN: 21 mg/dL (ref 7–25)
CALCIUM: 9.1 mg/dL (ref 8.6–10.4)
CO2: 27 mmol/L (ref 20–32)
CREATININE: 0.77 mg/dL (ref 0.50–0.99)
Chloride: 95 mmol/L — ABNORMAL LOW (ref 98–110)
GFR, EST AFRICAN AMERICAN: 91 mL/min/{1.73_m2} (ref >=60)
GFR, EST NON AFRICAN AMERICAN: 79 mL/min/{1.73_m2} (ref >=60)
GLOBULIN: 2.4 g/dL (ref 1.9–3.7)
Glucose, Bld: 81 mg/dL (ref 65–139)
Potassium: 4 mmol/L (ref 3.5–5.3)
SODIUM: 130 mmol/L — AB (ref 135–146)
TOTAL PROTEIN: 6.4 g/dL (ref 6.1–8.1)
Total Bilirubin: 0.3 mg/dL (ref 0.2–1.2)

## 2017-12-11 LAB — TSH: TSH: 1.14 m[IU]/L (ref 0.40–4.50)

## 2017-12-11 LAB — LIPID PANEL
CHOL/HDL RATIO: 3.3 (calc) (ref <5.0)
CHOLESTEROL: 163 mg/dL (ref <200)
HDL: 49 mg/dL — ABNORMAL LOW (ref >50)
LDL CHOLESTEROL (CALC): 70 mg/dL
NON-HDL CHOLESTEROL (CALC): 114 mg/dL (ref <130)
Triglycerides: 367 mg/dL — ABNORMAL HIGH (ref <150)

## 2017-12-11 LAB — ANA,IFA RA DIAG PNL W/RFLX TIT/PATN
Anti Nuclear Antibody(ANA): POSITIVE — AB
Rhuematoid fact SerPl-aCnc: <14 IU/mL (ref <14)

## 2017-12-11 LAB — ANTI-NUCLEAR AB-TITER (ANA TITER): ANA Titer 1: 1:640 {titer} — ABNORMAL HIGH

## 2017-12-11 LAB — CBC WITH DIFFERENTIAL/PLATELET
BASOS PCT: 0.5 %
Basophils Absolute: 40 cells/uL (ref 0–200)
EOS PCT: 3.4 %
Eosinophils Absolute: 269 cells/uL (ref 15–500)
HEMATOCRIT: 37.2 % (ref 35.0–45.0)
HEMOGLOBIN: 12.7 g/dL (ref 11.7–15.5)
LYMPHS ABS: 2560 {cells}/uL (ref 850–3900)
MCH: 30.7 pg (ref 27.0–33.0)
MCHC: 34.1 g/dL (ref 32.0–36.0)
MCV: 89.9 fL (ref 80.0–100.0)
MONOS PCT: 8 %
MPV: 9.6 fL (ref 7.5–12.5)
NEUTROS ABS: 4400 {cells}/uL (ref 1500–7800)
Neutrophils Relative %: 55.7 %
Platelets: 265 10*3/uL (ref 140–400)
RBC: 4.14 10*6/uL (ref 3.80–5.10)
RDW: 13.1 % (ref 11.0–15.0)
Total Lymphocyte: 32.4 %
WBC mixed population: 632 cells/uL (ref 200–950)
WBC: 7.9 10*3/uL (ref 3.8–10.8)

## 2017-12-11 LAB — VITAMIN D 25 HYDROXY (VIT D DEFICIENCY, FRACTURES): VIT D 25 HYDROXY: 39 ng/mL (ref 30–100)

## 2017-12-11 LAB — HEMOGLOBIN A1C
EAG (MMOL/L): 6.6 (calc)
HEMOGLOBIN A1C: 5.8 %{Hb} — AB (ref <5.7)
Mean Plasma Glucose: 120 (calc)

## 2017-12-11 LAB — VITAMIN B12: Vitamin B-12: 868 pg/mL (ref 200–1100)

## 2017-12-11 LAB — HIV ANTIBODY (ROUTINE TESTING W REFLEX): HIV: NONREACTIVE

## 2017-12-11 NOTE — Chronic Care Management (AMB) (Signed)
Chronic Care Management   Follow Up Note   12/11/2017 Name: Madlynn Lundeen MRN: [id number] DOB: [birth year]  Referred by: Arnetha Courser, MD Reason for referral : Chronic Care Management (mental health/housing resources)    Subjective:I found 200.00 in my purse that I thought I had spent" "My dear friend took me to [facility] today and I filled out paperwork for rent assistance" "I made a zillion house calls yesterday"   Objective:   Assessment: Ms.Avital Marlet Korte is a 67 year old patient of Dr. Enid Derry who was referred 12/07/17 to Chronic Care Management as a "stat referral" related to homelessness. After multiple conversations with patient it is also determined that patient has not followed up as directed per [facility] discharge AVS with Mental Health Outpatient Provider.  Goals Addressed    . COMPLETED: "I am homeless and will run out of money for a hotel by next  Wednesday" (pt-stated)       RN CM received a LONG DETAILED (3 min) incoming VM message from Ms. Victorino today after business hours. Ms. Tabak message was very detailed yet very disjointed covering multiple topics of conversation.  Ms. Buxbaum states she went to see her pulmonologist for a sick appointment today, went to the chiropractor for "realignment", had her friend (she recently denied any friend or family that was available for support) drive her to [employer] where she filled out an application for [employer] assistance, "had lunch someone bought me a cake". She states she is paid up at the hotel until tomorrow and has found 200.00 in her purse that her family had given her, that she thought she has previously spent.   She state she called and text Baron Hamper with [facility] to see if they would reconsider assisting her with her mental health. I received an incoming call today from [facility]) states she had spoken with Ms. Terance Hart Production designer, theatre/television/film) and confirmed  that patient was unable to utilize resources and would not be reconsidered.   Spoke with Armen Pickup regarding ACT Team referral. Requested basic information including demographics, diagnosis, recent [place] visit dates, notes of encounters with patient to be faxed to ACT Team for consideration.   Clinical Goals: Over the next 5 days, patient will report a plan for emergiency housing  Interventions: Collaborated with [place] for housing resource options, discussed with patient options for housing assistance and emergency housing: RN CM called the following resources on patients behalf  [place]- Patient unable to utilize services per Gillermo Murdoch related to patient previous encounters.  Doctor, general practice- (called for additional resources) NO ANSWER  Senior Housing Options: [address]-closed at [employer] at [employer]  12/10/17 Multiple housing, financial, and behavioral health resources contacted today with NO resolutions to patients housing crisis (see telephone call initiation tab). APS report filed with [facility] as patient is "living in [place]" [facility]-Not taking any new patients in [place] [facility]-Will not accept patient related to previous history with patient [employer]-left message [place]-Does not take patients UHC Medicare (primary insurance) [place] takes [facility] Medicare PPO not HMO [employer] message [facility]-left message  12/11/17   Left message for [employer]  [phone] enquiring about potential monetary assistance for housing, Completed intake with [place] Team [phone] for Behavioral Health Outpatient follow up.  Permanent Engineer, agricultural for [employer], including low income housing and  boarding homes, left with PCP to be given to patient at 12/12/17  appointment      Plan: Will follow up with patient tomorrow to confirm resolution to housing crisis. Have left list of low income housing options with PCP to be given to patient tomorrow during her follow up PCP visit as RN CM will not be in the office tomorrow.  Will fax information to [place] team as requested    Grant Rollene Rotunda, RN, BSN Nurse Care Coordinator [facility] Care Management  [phone]

## 2017-12-12 ENCOUNTER — Ambulatory Visit (INDEPENDENT_AMBULATORY_CARE_PROVIDER_SITE_OTHER): Payer: Medicare Other | Admitting: Family Medicine

## 2017-12-12 ENCOUNTER — Encounter: Payer: Self-pay | Admitting: Family Medicine

## 2017-12-12 ENCOUNTER — Ambulatory Visit: Payer: Medicare Other

## 2017-12-12 VITALS — BP 116/74 | HR 71 | Temp 97.9°F | Ht 63.0 in | Wt 137.9 lb

## 2017-12-12 DIAGNOSIS — E222 Syndrome of inappropriate secretion of antidiuretic hormone: Secondary | ICD-10-CM

## 2017-12-12 DIAGNOSIS — R269 Unspecified abnormalities of gait and mobility: Secondary | ICD-10-CM

## 2017-12-12 DIAGNOSIS — Z1211 Encounter for screening for malignant neoplasm of colon: Secondary | ICD-10-CM

## 2017-12-12 DIAGNOSIS — I10 Essential (primary) hypertension: Secondary | ICD-10-CM

## 2017-12-12 DIAGNOSIS — R768 Other specified abnormal immunological findings in serum: Secondary | ICD-10-CM | POA: Diagnosis not present

## 2017-12-12 DIAGNOSIS — G894 Chronic pain syndrome: Secondary | ICD-10-CM | POA: Diagnosis not present

## 2017-12-12 DIAGNOSIS — K219 Gastro-esophageal reflux disease without esophagitis: Secondary | ICD-10-CM

## 2017-12-12 DIAGNOSIS — R296 Repeated falls: Secondary | ICD-10-CM

## 2017-12-12 DIAGNOSIS — Z5900 Homelessness unspecified: Secondary | ICD-10-CM

## 2017-12-12 DIAGNOSIS — M19042 Primary osteoarthritis, left hand: Secondary | ICD-10-CM

## 2017-12-12 DIAGNOSIS — M19041 Primary osteoarthritis, right hand: Secondary | ICD-10-CM

## 2017-12-12 DIAGNOSIS — E039 Hypothyroidism, unspecified: Secondary | ICD-10-CM

## 2017-12-12 DIAGNOSIS — R140 Abdominal distension (gaseous): Secondary | ICD-10-CM

## 2017-12-12 DIAGNOSIS — R2689 Other abnormalities of gait and mobility: Secondary | ICD-10-CM | POA: Diagnosis not present

## 2017-12-12 DIAGNOSIS — K581 Irritable bowel syndrome with constipation: Secondary | ICD-10-CM

## 2017-12-12 DIAGNOSIS — Z598 Other problems related to housing and economic circumstances: Secondary | ICD-10-CM

## 2017-12-12 DIAGNOSIS — Z599 Problem related to housing and economic circumstances, unspecified: Secondary | ICD-10-CM

## 2017-12-12 DIAGNOSIS — I83893 Varicose veins of bilateral lower extremities with other complications: Secondary | ICD-10-CM

## 2017-12-12 DIAGNOSIS — E559 Vitamin D deficiency, unspecified: Secondary | ICD-10-CM

## 2017-12-12 DIAGNOSIS — E538 Deficiency of other specified B group vitamins: Secondary | ICD-10-CM

## 2017-12-12 MED ORDER — BIOTIN 5000 MCG PO TABS: ORAL_TABLET | ORAL | Status: DC

## 2017-12-12 MED ORDER — QUETIAPINE FUMARATE 100 MG PO TABS: 25.0000 mg | ORAL_TABLET | Freq: Every day | ORAL | Status: DC

## 2017-12-12 MED ORDER — ICOSAPENT ETHYL 1 G PO CAPS: ORAL_CAPSULE | ORAL | 11 refills | Status: DC

## 2017-12-12 MED ORDER — LACTULOSE 10 GM/15ML PO SOLN: 10.0000 g | Freq: Every day | ORAL | 1 refills | Status: DC | PRN

## 2017-12-12 MED ORDER — TRAZODONE HCL 100 MG PO TABS: 50.0000 mg | ORAL_TABLET | Freq: Every day | ORAL | Status: DC

## 2017-12-12 NOTE — Patient Instructions (Signed)
1. Thank you for allowing the CCM Team to assist you with your needs!! 2. Please continue to outreach to housing and mental health resources provided. 3. Contact the CCM Team as needed although housing and mental health resources have been exhausted and messages have been left. You have been given resources to follow up independently as well.  CCM (Chronic Care Management) Team   Trish Fountain RN, BSN Nurse Care Coordinator  [phone]  [name] PharmD  Clinical Pharmacist  [phone]  The patient verbalized understanding of instructions provided today and declined a print copy of patient instruction materials.

## 2017-12-12 NOTE — Assessment & Plan Note (Signed)
Water soluble, continue supplements

## 2017-12-12 NOTE — Assessment & Plan Note (Signed)
Resolved; cautioned about too much vitamin D which can become toxic

## 2017-12-12 NOTE — Chronic Care Management (AMB) (Signed)
Chronic Care Management   Follow Up Note   12/12/2017 Name: Michaiah Maiden MRN: [id number] DOB: 02/01/1950  Referred by: Arnetha Courser, MD Reason for referral : Chronic Care Management (returning patients call housing/BH resourses)    Subjective: "I need to talk to you"   Objective:   Assessment: Ms.Elaf Kaylina Cahue is a 67 year old patient of Dr. Enid Derry who was referred11/22/19to Chronic Care Management as a "stat referral" related to homelessness. After multiple conversations with patient it is also determined that patient has not followed up as [facility] with Mental Health Outpatient Provider.     Goals Addressed    . "I am homeless and will run out of money for a hotel by next  Wednesday" (pt-stated)   Not on track    Spoke with patient via phone. She presented to PCP appointment requesting to speak with CCM RN CM ASAP. Ms. Mcmaster begin to recap her day yesterday as she did on VM message last evening (see note 12/11/17). RN CM had to redirect conversation back to topic of call multiple times. Patient unable to understand that her previous choices has limited CCM RN CM ability to find immediate assistance with housing or mental health. Patient becomes agitated and defensive during conversation. She requests personal attendant to drive her, assist with filling out applications 3 days a week. She has spoken to [facility] about her request??? Explained to Ms. Torosian that I do not have the resources to assist her with her request and housing should be her main concern at this time. She has verbally acknowledged the ability to make calls on her behalf and appears to be very resourceful. Unfortunately if she has made calls/text as she describes with no follow up, it may confirm her previous exhaustion of resources as verbalized by come agencies contacted by this RN CM. I suggested she request assistance from her "dear friend" with filling out  applications for housing assistance. She refuses stating "Would you want to keep a dear friend that always asked you to do stuff for her?".  Patient states she will not receive her check from social security until 12/3 which will again leave her homeless (hotel paid until 12/16/17). Suggested she ask her friend for financial assistance as her friend still works OR if she could stay with her friend until she could find permanent housing. This was also not an option. This CCM RN CM in collaboration with [facility] LCSW has exhausted all resources at this time. Patient is unwilling to relocate to a different city for emergency housing. Patient notified. Will leave goal open for now as resources may call back after the Thanksgiving Holiday.   Clinical Goals: Over the next 5 days, patient will report a plan for emergiency housing  Interventions: Collaborated with [place] for housing resource options, discussed with patient options for housing assistance and emergency housing: RN CM called the following resources on patients behalf  [place]- Patient unable to utilize services per Gillermo Murdoch related to patient previous encounters.  Doctor, general practice- (called for additional resources) NO ANSWER  Senior Housing Options: [address]-closed at [employer] at [employer]  12/10/17 Multiple housing, financial, and behavioral health resources contacted today with NO resolutions to patients housing crisis (see telephone call initiation tab). APS report filed with [facility] as patient is "living in [place] taking any new patients in [place] [facility]-Will not accept patient related to previous history with patient [employer]  [employer]-left message [place]-Does not take patients UHC Medicare (primary insurance) [place] takes [facility] Medicare PPO not HMO [employer] [facility]  [place] message [facility]-left message   12/11/17   Left message for [employer]  [phone] enquiring about potential monetary assistance for housing, Completed intake with Armen Pickup ACT Team [phone] for Behavioral Health Outpatient follow up.  Permanent Housing Futures trader for [employer], including low income housing and boarding homes, left with PCP to be given to patient at 12/12/17 appointment      Plan: Patient provided all available resources and numbers for housing and [facility] outpatient options. Will provided [place] Team with intake information requested via Fax.Patient is provided with CCM Team Contact information and will access CCM Team as needed. Will contact patient with any information from resources as it comes in.    Cressie Betzler E. Rollene Rotunda, RN, BSN Nurse Care Coordinator [facility] Care Management  [phone]

## 2017-12-12 NOTE — Assessment & Plan Note (Signed)
TSH was normal and we'll continue the course

## 2017-12-12 NOTE — Assessment & Plan Note (Signed)
Possibly from psychiatric medicines; offered referral to nephrologist

## 2017-12-12 NOTE — Progress Notes (Signed)
BP 116/74   Pulse 71   Temp 97.9 F (36.6 C) (Oral)   Ht 5\' 3"  (1.6 m)   Wt 137 lb 14.4 oz (62.6 kg)   SpO2 98%   BMI 24.43 kg/m    Subjective:    Patient ID: Angela [name], female    DOB: 08-Jun-1950, 67 y.o.   MRN: [id number]  HPI: Angela [name] is a 67 y.o. female  Chief Complaint  Patient presents with  . Follow-up    HPI Patient is here to go over multiple labs after she established care with me last week and had numerous complaints and requests  She has a markedly elevated ANA, 1:640; her chiropractor said her muscle tissue was swollen in two places and everthing is out of alignment Grandmother (maternal) had rheumatoid arthritis Lots of musculoskeletal issues in the family on her father's side Everything is inflamed in her hands and ankles; braces for carpal tunnel and sleeves for ankles; they have given her shots in the ankles for years; ankle braces as well from podiatrist, Dr. Amalia Hailey Father was Ashkenazi jewish background; lived to be 84 years old Mother was very healthy, athlete; lived to be 69 years old  Mental illness on both sides of the family; maternal uncle had alcoholism  She has abdominal swelling and cannot void; she has gained 10 pounds over the last few months; thinks it is the psych medicines from Dr. Weber Cooks Last BM was last night  Reviewed all labs with her; she is doing the sublingual vitamin B complex  Prediabetes; A1c was 5.8; dry mouth, perhaps from medicines; ordered biotine mouth spray  High TG; she would like to use Vascepa instead of Lovaza; TG were 367  Vitamin D; she is taking supplements with vitamin D in them  SIADH for years; Na+ is 130, Cl- is 95  Urine and vaginal swab were negative  She is not taking the paxil CR 25 mg daily because insurance needs prior auth  She has varicose veins and wears compression stockings; wants to see a vein specialist; she does not have pain or ulcers; her paternal uncle had surgery  on his varicose veins  She feels bloated, has a lot of gas; no fevers  Depression screen [facility] 2/9 12/07/2017 11/17/2016 11/17/2016  Decreased Interest 3 3 3   Down, Depressed, Hopeless 3 3 3   PHQ - 2 Score 6 6 6   Altered sleeping 3 3 3   Tired, decreased energy 3 3 3   Change in appetite 3 3 3   Feeling bad or failure about yourself  3 3 3   Trouble concentrating 3 3 3   Moving slowly or fidgety/restless 0 0 0  Suicidal thoughts 0 0 0  PHQ-9 Score 21 21 21   Difficult doing work/chores - Somewhat difficult Very difficult  Some recent data might be hidden   Fall Risk  12/07/2017 11/17/2016  Falls in the past year? 1 Yes  Number falls in past yr: 1 -  Injury with Fall? 1 -  Risk for fall due to : - History of fall(s)   MD note: she has fallen maybe seven times in the last two years; she was seeing PT, doing balance therapy Used to see Dr. Kathyrn Sheriff, ENT; she has a perforated ear drum and they want to patch her drum; appointment coming up soon, [facility]. Blane [facility]   Relevant past medical, surgical, family and social history reviewed Past Medical History:  Diagnosis Date  . Allergic rhinitis   .  Anemia   . Blurred vision   . Depression   . Diverticulosis   . Endometriosis   . Falls   . GERD (gastroesophageal reflux disease)   . Hernia, hiatal   . Hypothyroidism   . IBS (irritable bowel syndrome)   . Malignant neoplasm of skin   . Migraine   . Neuropathy   . PTSD (post-traumatic stress disorder)   . Thyroid disease    Past Surgical History:  Procedure Laterality Date  . APPENDECTOMY    . CHOLECYSTECTOMY    . CHOLECYSTECTOMY, LAPAROSCOPIC    . ESOPHAGOGASTRODUODENOSCOPY (EGD) WITH PROPOFOL N/A 03/29/2015   Procedure: ESOPHAGOGASTRODUODENOSCOPY (EGD) WITH PROPOFOL;  Surgeon: Josefine Class, MD;  Location: [facility] ENDOSCOPY;  Service: Endoscopy;  Laterality: N/A;  . HERNIA REPAIR    . laproscopy    . TONSILLECTOMY    . uteral suspension     Family History    Problem Relation Age of Onset  . Heart disease Father   . Hearing loss Father   . COPD Father   . Depression Father   . Stroke Father   . Vision loss Father   . Varicose Veins Father   . Cancer Mother        lung  . Depression Sister   . Arthritis Sister   . Arthritis Brother   . Hernia Brother   . Anxiety disorder Brother   . Arthritis Brother   . Hernia Brother   . Anxiety disorder Brother   . Urolithiasis Neg Hx   . Kidney disease Neg Hx   . Kidney cancer Neg Hx   . Prostate cancer Neg Hx    Social History   Tobacco Use  . Smoking status: Former Smoker    Last attempt to quit: 11/17/1976    Years since quitting: 41.1  . Smokeless tobacco: Never Used  Substance Use Topics  . Alcohol use: No  . Drug use: No     [facility] from 12/07/2017 [facility]  AUDIT-C Score  0      Interim medical history since last visit reviewed. Allergies and medications reviewed  Review of Systems Per HPI unless specifically indicated above     Objective:    BP 116/74   Pulse 71   Temp 97.9 F (36.6 C) (Oral)   Ht 5\' 3"  (1.6 m)   Wt 137 lb 14.4 oz (62.6 kg)   SpO2 98%   BMI 24.43 kg/m   Wt Readings from Last 3 Encounters:  12/12/17 137 lb 14.4 oz (62.6 kg)  12/07/17 133 lb 6.4 oz (60.5 kg)  11/12/17 130 lb (59 kg)    Physical Exam  Constitutional: She appears well-developed and well-nourished. No distress.  HENT:  Head: Normocephalic and atraumatic.  Eyes: EOM are normal. No scleral icterus.  Neck: No thyromegaly present.  Cardiovascular: Normal rate, regular rhythm and normal heart sounds.  No murmur heard. Pulmonary/Chest: Effort normal and breath sounds normal. No respiratory distress. She has no wheezes.  Abdominal: Soft. Bowel sounds are normal. She exhibits distension (full but soft). There is no rebound and no guarding.  Musculoskeletal: She exhibits no edema.  Neurological: She is alert.  Skin: Skin is warm and dry. She is not  diaphoretic. No pallor.  Psychiatric: She has a normal mood and affect. Her behavior is normal. Judgment and thought content normal.    Results for orders placed or performed in visit on 12/07/17  HIV antibody (with reflex)  Result Value Ref  Range   HIV 1&2 Ab, 4th Generation NON-REACTIVE NON-REACTI  CBC with Differential/Platelet  Result Value Ref Range   WBC 7.9 3.8 - 10.8 Thousand/uL   RBC 4.14 3.80 - 5.10 Million/uL   Hemoglobin 12.7 11.7 - 15.5 g/dL   HCT 37.2 35.0 - 45.0 %   MCV 89.9 80.0 - 100.0 fL   MCH 30.7 27.0 - 33.0 pg   MCHC 34.1 32.0 - 36.0 g/dL   RDW 13.1 11.0 - 15.0 %   Platelets 265 140 - 400 Thousand/uL   MPV 9.6 7.5 - 12.5 fL   Neutro Abs 4,400 1,500 - 7,800 cells/uL   Lymphs Abs 2,560 850 - 3,900 cells/uL   WBC mixed population 632 200 - 950 cells/uL   Eosinophils Absolute 269 15 - 500 cells/uL   Basophils Absolute 40 0 - 200 cells/uL   Neutrophils Relative % 55.7 %   Total Lymphocyte 32.4 %   Monocytes Relative 8.0 %   Eosinophils Relative 3.4 %   Basophils Relative 0.5 %  COMPLETE METABOLIC PANEL WITH GFR  Result Value Ref Range   Glucose, Bld 81 65 - 139 mg/dL   BUN 21 7 - 25 mg/dL   Creat 0.77 0.50 - 0.99 mg/dL   GFR, Est Non African American 79 > OR = 60 mL/min/1.62m2   GFR, Est African American 91 > OR = 60 mL/min/1.10m2   BUN/Creatinine Ratio NOT APPLICABLE 6 - 22 (calc)   Sodium 130 (L) 135 - 146 mmol/L   Potassium 4.0 3.5 - 5.3 mmol/L   Chloride 95 (L) 98 - 110 mmol/L   CO2 27 20 - 32 mmol/L   Calcium 9.1 8.6 - 10.4 mg/dL   Total Protein 6.4 6.1 - 8.1 g/dL   Albumin 4.0 3.6 - 5.1 g/dL   Globulin 2.4 1.9 - 3.7 g/dL (calc)   AG Ratio 1.7 1.0 - 2.5 (calc)   Total Bilirubin 0.3 0.2 - 1.2 mg/dL   Alkaline phosphatase (APISO) 97 33 - 130 U/L   AST 17 10 - 35 U/L   ALT 17 6 - 29 U/L  Lipid panel  Result Value Ref Range   Cholesterol 163 <200 mg/dL   HDL 49 (L) >50 mg/dL   Triglycerides 367 (H) <150 mg/dL   LDL Cholesterol (Calc) 70 mg/dL  (calc)   Total CHOL/HDL Ratio 3.3 <5.0 (calc)   Non-HDL Cholesterol (Calc) 114 <130 mg/dL (calc)  Hemoglobin A1c  Result Value Ref Range   Hgb A1c MFr Bld 5.8 (H) <5.7 % of total Hgb   Mean Plasma Glucose 120 (calc)   eAG (mmol/L) 6.6 (calc)  VITAMIN D 25 Hydroxy (Vit-D Deficiency, Fractures)  Result Value Ref Range   Vit D, 25-Hydroxy 39 30 - 100 ng/mL  Vitamin B12  Result Value Ref Range   Vitamin B-12 868 200 - 1,100 pg/mL  Iron, TIBC and Ferritin Panel  Result Value Ref Range   Iron 76 45 - 160 mcg/dL   TIBC 311 250 - 450 mcg/dL (calc)   %SAT 24 16 - 45 % (calc)   Ferritin 48 16 - 288 ng/mL  ANA,IFA RA Diag Pnl w/rflx Tit/Patn  Result Value Ref Range   Anti Nuclear Antibody(ANA) POSITIVE (A) NEGATIVE   Rhuematoid fact SerPl-aCnc <78 <46 IU/mL   Cyclic Citrullin Peptide Ab <16 UNITS   INTERPRETATION    TSH  Result Value Ref Range   TSH 1.14 0.40 - 4.50 mIU/L  Anti-nuclear ab-titer (ANA titer)  Result Value Ref Range  ANA Titer 1 1:640 (H) titer   ANA Pattern 1 Nuclear, Homogeneous (A)    ANA TITER 1:40 (H) titer   ANA PATTERN Nuclear, Speckled (A)   POCT Urinalysis Dipstick  Result Value Ref Range   Color, UA light yellow    Clarity, UA clear    Glucose, UA Negative Negative   Bilirubin, UA neg    Ketones, UA neg    Spec Grav, UA 1.020 1.010 - 1.025   Blood, UA neg    pH, UA 5.5 5.0 - 8.0   Protein, UA Negative Negative   Urobilinogen, UA 0.2 0.2 or 1.0 E.U./dL   Nitrite, UA neg    Leukocytes, UA Negative Negative   Appearance clear    Odor normal   Cervicovaginal ancillary only  Result Value Ref Range   Bacterial vaginitis Negative for Bacterial Vaginitis Microorganisms    Candida vaginitis Negative for Candida species    Trichomonas Negative       Assessment & Plan:   Problem List Items Addressed This Visit      Cardiovascular and Mediastinum   Essential hypertension    Well-controlled      Relevant Medications   Icosapent Ethyl (VASCEPA) 1 g  CAPS     Digestive   IBS (irritable bowel syndrome)   Relevant Medications   lactulose (CHRONULAC) 10 GM/15ML solution   Other Relevant Orders   Ambulatory referral to Gastroenterology   GERD (gastroesophageal reflux disease)   Relevant Medications   lactulose (CHRONULAC) 10 GM/15ML solution   Other Relevant Orders   Ambulatory referral to Gastroenterology     Endocrine   SIADH (syndrome of inappropriate ADH production) ([place])    Possibly from psychiatric medicines; offered referral to nephrologist      Relevant Orders   Ambulatory referral to Nephrology   Hypothyroidism    TSH was normal and we'll continue the course        Musculoskeletal and Integument   Arthritis of hand, degenerative   Relevant Medications   predniSONE (DELTASONE) 5 MG tablet   Other Relevant Orders   Ambulatory referral to Rheumatology     Other   Chronic pain syndrome   Relevant Orders   Ambulatory referral to Rheumatology   Vitamin D deficiency    Resolved; cautioned about too much vitamin D which can become toxic      Vitamin B12 deficiency    Water soluble, continue supplements      Gait abnormality    Refer to PT       Other Visit Diagnoses    Positive ANA (antinuclear antibody)    -  Primary   Relevant Orders   Ambulatory referral to Rheumatology   Balance problem       Relevant Orders   Ambulatory referral to Physical Therapy   Multiple falls       Relevant Orders   Ambulatory referral to Physical Therapy   Colon cancer screening       Relevant Orders   Ambulatory referral to Gastroenterology   Varicose veins of leg with swelling, bilateral       Relevant Medications   Icosapent Ethyl (VASCEPA) 1 g CAPS   Other Relevant Orders   Ambulatory referral to Vascular Surgery   Abdominal bloating       Relevant Orders   DG Abd Acute W/Chest       Follow up plan: Return in about 26 days (around 01/07/2018) for follow-up visit with Dr. Sanda Klein.  An after-visit summary was  printed and given to the patient at [place].  Please see the patient instructions which may contain other information and recommendations beyond what is mentioned above in the assessment and plan.  Meds ordered this encounter  Medications  . lactulose (CHRONULAC) 10 GM/15ML solution    Sig: Take 15 mLs (10 g total) by mouth daily as needed for mild constipation.    Dispense:  473 mL    Refill:  1  . Icosapent Ethyl (VASCEPA) 1 g CAPS    Sig: Two capsules by mouth twice a day (to help with triglycerides)    Dispense:  120 capsule    Refill:  11    Do NOT fill the Lovaza sent a few days ago; cancel that Rx  . QUEtiapine (SEROQUEL) 100 MG tablet    Sig: Take 0.5 tablets (50 mg total) by mouth at bedtime.  . traZODone (DESYREL) 100 MG tablet    Sig: Take 0.5 tablets (50 mg total) by mouth at bedtime.  . Biotin 5000 MCG TABS    Sig: Two tablets by mouth daily    Dispense:  30 tablet    Orders Placed This Encounter  Procedures  . DG Abd Acute W/Chest  . Ambulatory referral to Rheumatology  . Ambulatory referral to Physical Therapy  . Ambulatory referral to Nephrology  . Ambulatory referral to Gastroenterology  . Ambulatory referral to Vascular Surgery   Face-to-face time with patient was more than 40 minutes, >50% time spent counseling and coordination of care

## 2017-12-12 NOTE — Assessment & Plan Note (Signed)
Refer to PT

## 2017-12-12 NOTE — Assessment & Plan Note (Signed)
Well controlled 

## 2017-12-12 NOTE — Patient Instructions (Addendum)
Go from here to the xray facility across the street If your symptoms worsen over the long weekend, please report to the emergency department  I'll recommend that you STOP the flax seed supplement Stop the Lovaza Start Vascepa  Limit your vitamin D3 intake to no more than 2,000 iu daily total; too much can be harmful  You really only need 1,000 iu of vitamin D3 daily for bone health, etc.  Too many vitamins can be harmful One good multiple vitamin daily is probably all you need  These referrals have been made for you today: Nephrology Gastroenterology Physical therapy Rheumatology Vascular specialist  You can pick up Gas-X for excess gas  Be careful about the caffeine intake in the drink you showed me; I'll recommend one without caffeine

## 2017-12-14 ENCOUNTER — Telehealth: Payer: Self-pay | Admitting: Family Medicine

## 2017-12-14 NOTE — Telephone Encounter (Signed)
Copied from [place] [id number]. Topic: Quick Communication - Rx Refill/Question >> Dec 14, 2017 12:52 PM R[place]ford Nail, [place] wrote: Angela [name] at pharmacy calling and states that patient is wanting to know if Dr Sanda Klein could refill this medication. States that Dr Weber Cooks, the original prescriber, has denied doing the PA that is needed for her insurance. Please advise.**  Medication: PARoxetine (PAXIL-CR) 25 MG 24 hr tablet  Has the patient contacted their pharmacy? Yes.   (Agent: If no, request that the patient contact the pharmacy for the refill.) (Agent: If yes, when and what did the pharmacy advise?)  Preferred Pharmacy (with phone number or street name): [facility], [place]: Please be advised that RX refills may take up to 3 business days. We ask that you follow-up with your pharmacy.

## 2017-12-14 NOTE — Progress Notes (Signed)
LCSW received a call from this patient who has been discharged from [facility]. She has requested to come back to ED she reports she is struggling but not suicidal and wants help to get her medications. LCSW reviewed her current situation and she was directed to see her doctor or clinic doctor and to have her medications refilled.She stated she needs my help to find a place. When patient did not receive the support she wanted she yelled at this worker and hung up.  This worker has contacted [employer] to assist her with her community needs and to have a care coordinator attached. This LCSW called [facility] several months ago and called again today to see if a care coordinator was arranged. ON Nov 4th this patient DECLINED services as per Ms Gwendolyn [facility].  LCSW consulted with ED charge and and director of SW Nathaniel Man to let me him know that she might be returning to ed.

## 2017-12-15 ENCOUNTER — Encounter

## 2017-12-17 ENCOUNTER — Telehealth: Payer: Self-pay

## 2017-12-17 ENCOUNTER — Ambulatory Visit (INDEPENDENT_AMBULATORY_CARE_PROVIDER_SITE_OTHER): Payer: Medicare Other

## 2017-12-17 DIAGNOSIS — M5033 Other cervical disc degeneration, cervicothoracic region: Secondary | ICD-10-CM | POA: Diagnosis not present

## 2017-12-17 DIAGNOSIS — G894 Chronic pain syndrome: Secondary | ICD-10-CM

## 2017-12-17 DIAGNOSIS — M9901 Segmental and somatic dysfunction of cervical region: Secondary | ICD-10-CM | POA: Diagnosis not present

## 2017-12-17 DIAGNOSIS — F3164 Bipolar disorder, current episode mixed, severe, with psychotic features: Secondary | ICD-10-CM | POA: Diagnosis not present

## 2017-12-17 DIAGNOSIS — M9902 Segmental and somatic dysfunction of thoracic region: Secondary | ICD-10-CM | POA: Diagnosis not present

## 2017-12-17 DIAGNOSIS — G3184 Mild cognitive impairment, so stated: Secondary | ICD-10-CM

## 2017-12-17 DIAGNOSIS — M5412 Radiculopathy, cervical region: Secondary | ICD-10-CM | POA: Diagnosis not present

## 2017-12-17 NOTE — Telephone Encounter (Signed)
For what? I think she has so many referrals at this time, that we'll postpone any nutrition referral and have her focus on the other appointments We can discuss nutrition at her next visit

## 2017-12-17 NOTE — Telephone Encounter (Signed)
Pt notified, placed referral note.  Pt would also like referral to nutritionist?

## 2017-12-17 NOTE — Telephone Encounter (Signed)
porsha from [facility] would like to speak with you about this patient.

## 2017-12-17 NOTE — Telephone Encounter (Signed)
Is this a no since she needs to see psych?

## 2017-12-17 NOTE — Telephone Encounter (Signed)
Her psych medicines absolutely need to be coming from a psychiatrist

## 2017-12-17 NOTE — Chronic Care Management (AMB) (Signed)
Chronic Care Management   Follow Up Note    12/17/2017 Name: Elowyn Raupp MRN: [id number] DOB: 10/04/1950  Referred by: Arnetha Courser, MD Reason for referral : Chronic Care Management    Subjective: "I have been out of my paxil for 2 weeks and I need you to see if Dr. Sanda Klein has completed pre-authorizations" "I need you to find me a psychiatrist"   Objective:   Assessment: Ms.Luna Albirta Rhinehart is a 67 year old patient of Dr. Enid Derry who was referred11/22/19to Chronic Care Management as a "stat referral" related to homelessness. After multiple conversations with patient it is also determined that patient has not followed up as directed per [facility] discharge AVS with Mental Health Outpatient Provider.  Goals Addressed    . "I am homeless and will run out of money for a hotel by next  Wednesday" (pt-stated)        Clinical Goals: Reactivated 12/12/17: Over the next 5 days, patient will report a plan for emergiency housing  Interventions: Collaborated with [place] for housing resource options, discussed with patient options for housing assistance and emergency housing: RN CM called the following resources on patients behalf  [employer]- Patient unable to utilize services per Gillermo Murdoch related to patient previous encounters.  Doctor, general practice- (called for additional resources) NO ANSWER  Senior Housing Options: [address]-closed at [employer] at [employer]  12/10/17 Multiple housing, financial, and behavioral health resources contacted today with NO resolutions to patients housing crisis (see telephone call initiation tab). APS report filed with [facility] as patient is "living in [place] [employer] taking any new patients in [place]-Will not accept patient related to previous history with patient [employer]-left message [place]-Does not  take patients UHC Medicare (primary insurance) [place] takes [facility] Medicare PPO not HMO [employer] [facility] [place] message [facility]-left message   12/11/17   Left message for [employer]  [phone] enquiring about potential monetary assistance for housing, Completed intake with [place] Team [phone] for Behavioral Health Outpatient follow up.  Permanent Housing Futures trader for [employer], including low income housing and boarding homes, left with PCP to be given to patient at 12/12/17 appointment  12/12/17 Recommended patient ask "dear friend" to assist her with filling out applications to low income housing.   12/17/17 Patient encouraged utilization of housing resources previously given.    . "I have not had my paxil in two weeks" (pt-stated)       Incoming call from Ms. Simpson requesting CCM RN CM see if "preauthorization has been completed by Dr. Sanda Klein for her paxil". Patient states she has been out for two weeks. She continues to request CCM RN CM "find her a psychiatrist". Multiple attempts have been made. Awaiting call backs from multiple providers. Ms. Alfred has also been provider with numbers to outpatient [facility] providers including [facility] for which she was to follow up with post her most recent discharge. Clinical Goals: Over the next 2 weeks, patient will state plan for mental health follow up as outpatient  Interventions:  Multiple intervention have been made over the last two weeks in attempt to locate patient an outpatient mental health provider.  12/17/17 Called [employer]-message left, [facility]-message left, [facility] Outpatient BH-left message. Patient given number for [place] per her request and [employer] contact number for follow-up. Encouraged patient to  also follow up with resources given including [facility].     Plan: Will  follow up with patient in 2 weeks   Casondra Gasca E. Rollene Rotunda, RN, BSN Nurse Care Coordinator [facility] Care Management  [phone]

## 2017-12-17 NOTE — Telephone Encounter (Signed)
Pt returning call to [facility]. Please advise. May leave detailed message.

## 2017-12-18 ENCOUNTER — Ambulatory Visit: Payer: Self-pay

## 2017-12-18 DIAGNOSIS — Z599 Problem related to housing and economic circumstances, unspecified: Secondary | ICD-10-CM

## 2017-12-18 DIAGNOSIS — Z598 Other problems related to housing and economic circumstances: Secondary | ICD-10-CM

## 2017-12-18 DIAGNOSIS — F3164 Bipolar disorder, current episode mixed, severe, with psychotic features: Secondary | ICD-10-CM

## 2017-12-18 NOTE — Chronic Care Management (AMB) (Addendum)
  Chronic Care Management   Follow Up Note   12/18/2017 Name: Angela [name] MRN: [id number] DOB: March 27, 1950  Referred by: Arnetha Courser, MD Reason for referral : Chronic Care Management   Care Coordination: CCM RN CM called patient this morning to discuss utilizing walk in clinic at [facility] to determine if she could be seen today. [name] is not interested. She is requesting money for transportation and becomes angry and uses profanity when RN CM ask if her friend could assist her with transportation. Patient is not interested in relocating to another city for services. Request RN CM contact family and or family attorney to request more money for patient from family trust. RN CM discussed with patient that this was not appropriate and suggested she have a conversation with her family. She again became hostile, yelling at RN CM and using profanity stating "you don't know anything about me and judging me because of what these people in [place] are saying. Patient disconnected call.  Prior to call ending patient did states she has received her paxil from [facility] last evening. She states she had to pay 70.00 because of "a change in my insurance".  Plan: Will follow up with PCP referrals coordinator Nathanial Millman who is also working on finding patient a psychiatry provider. The CCM Team will remain available on an as needed basis.  At 3:22 RN CM colloborated with Referrals Coordinator [facility]. Referral sent to [facility] via fax.   Shilee Biggs E. Rollene Rotunda, RN, BSN Nurse Care Coordinator [facility] [place] Care Management  [phone]

## 2017-12-18 NOTE — Patient Instructions (Addendum)
1. Continue to utilize resources for housing and [facility] previously given. 2. Contact the CCM Team as needed.  Goals Addressed    . "I am homeless and will run out of money for a hotel by next  Wednesday" (pt-stated)        Clinical Goals: Reactivated 12/12/17: Over the next 5 days, patient will report a plan for emergiency housing  Interventions: Collaborated with [place] for housing resource options, discussed with patient options for housing assistance and emergency housing: RN CM called the following resources on patients behalf  [employer]- Patient unable to utilize services per Gillermo Murdoch related to patient previous encounters.  Doctor, general practice- (called for additional resources) NO ANSWER  Senior Housing Options: [address]-closed at [employer] at [employer]  12/10/17 Multiple housing, financial, and behavioral health resources contacted today with NO resolutions to patients housing crisis (see telephone call initiation tab). APS report filed with [facility] as patient is "living in [employer] taking any new patients in [place]-Will not accept patient related to previous history with patient [employer]-left message [place]-Does not take patients UHC Medicare (primary insurance) [place] takes [facility] [place] [facility] Medicare PPO not HMO [employer] [place] message [facility]-left message   12/11/17   Left message for [employer]  [phone] enquiring about potential monetary assistance for housing, Completed intake with [place] Team [phone] for Behavioral Health Outpatient follow up.  Permanent Housing Futures trader for [employer], including low income housing and boarding homes, left with PCP to be given to patient at 12/12/17 appointment  12/12/17 Recommended patient ask "dear  friend" to assist her with filling out applications to low income housing.   12/17/17 Patient encouraged utilization of housing resources previously given.    . "I have not had my paxil in two weeks" (pt-stated)        Clinical Goals: Over the next 2 weeks, patient will state plan for mental health follow up as outpatient  Interventions:  Multiple intervention have been made over the last two weeks in attempt to locate patient an outpatient mental health provider.  12/17/17 Called [employer]-message left, [facility]-message left, [facility] Outpatient BH ([place])-message left.  Patient given number for [place] per her request and [employer] contact number for follow-up. Encouraged patient to also follow up with resources given including [employer].    CCM (Chronic Care Management) Team   Trish Fountain RN, BSN Nurse Care Coordinator  [phone]  [name] PharmD  Clinical Pharmacist  [phone]  The patient verbalized understanding of instructions provided today and declined a print copy of patient instruction materials.

## 2017-12-18 NOTE — Telephone Encounter (Signed)
    Angela [name] Female, 67 y.o., 01-19-1950 MRN:  [id number] Phone:  [phone] (H) ... PCP:  Arnetha Courser, MD Primary [facility] 01/07/2018 at 11:20 AM Message from Jodie Echevaria sent at 12/18/2017 9:40 AM EST   Patient called to say that she have an issue going on and is requesting a call back from Dr Sanda Klein. She did not want to elaborate just that she have an issue she need to resolve and only want to talk to Dr Sanda Klein before noon. Please advise Ph# [phone]   Call History    Type Contact Phone User  12/18/2017 09:37 AM Phone (Incoming) Angela [name], Angela [name] (Self) [phone] Lemmie Evens) Jodie Echevaria

## 2017-12-18 NOTE — Telephone Encounter (Signed)
She is having a lot of shooting pains in her arms and legs; she hasn't been sleeping well She laid down last night and got two hours of sleep She is hesitant to restart the seroquel She saw her sweetie perish from the [facility] psych [facility] I told her that I have to leave all psychiatric medicine recommendations up to the psychiatrist She has been begging to get a psychiatrist she says; staff is working to help her get plugged in with a provider She is welcome to make an appointment to discuss the shooting pains in the arms and legs She asked if prednisone and proair could cause trouble sleeping; she is very sensitive to medicines she says

## 2017-12-21 ENCOUNTER — Other Ambulatory Visit: Payer: Self-pay | Admitting: Family Medicine

## 2017-12-21 DIAGNOSIS — E039 Hypothyroidism, unspecified: Secondary | ICD-10-CM

## 2017-12-21 DIAGNOSIS — I10 Essential (primary) hypertension: Secondary | ICD-10-CM

## 2017-12-24 ENCOUNTER — Other Ambulatory Visit: Payer: Self-pay

## 2017-12-24 ENCOUNTER — Emergency Department
Admission: EM | Admit: 2017-12-24 | Discharge: 2017-12-30 | Disposition: A | Discharge status: Home / Self Care | Payer: Medicare Other | Attending: Emergency Medicine | Admitting: Emergency Medicine

## 2017-12-24 ENCOUNTER — Telehealth: Payer: Self-pay | Admitting: Family Medicine

## 2017-12-24 ENCOUNTER — Encounter: Payer: Self-pay | Admitting: Emergency Medicine

## 2017-12-24 DIAGNOSIS — Z85828 Personal history of other malignant neoplasm of skin: Secondary | ICD-10-CM | POA: Diagnosis not present

## 2017-12-24 DIAGNOSIS — F329 Major depressive disorder, single episode, unspecified: Secondary | ICD-10-CM

## 2017-12-24 DIAGNOSIS — Z87891 Personal history of nicotine dependence: Secondary | ICD-10-CM | POA: Diagnosis not present

## 2017-12-24 DIAGNOSIS — Z9119 Patient's noncompliance with other medical treatment and regimen: Secondary | ICD-10-CM

## 2017-12-24 DIAGNOSIS — Z91010 Allergy to peanuts: Secondary | ICD-10-CM | POA: Diagnosis not present

## 2017-12-24 DIAGNOSIS — Z91199 Patient's noncompliance with other medical treatment and regimen due to unspecified reason: Secondary | ICD-10-CM

## 2017-12-24 DIAGNOSIS — F319 Bipolar disorder, unspecified: Secondary | ICD-10-CM | POA: Insufficient documentation

## 2017-12-24 DIAGNOSIS — F3164 Bipolar disorder, current episode mixed, severe, with psychotic features: Secondary | ICD-10-CM | POA: Diagnosis present

## 2017-12-24 DIAGNOSIS — I10 Essential (primary) hypertension: Secondary | ICD-10-CM | POA: Diagnosis not present

## 2017-12-24 DIAGNOSIS — F3160 Bipolar disorder, current episode mixed, unspecified: Secondary | ICD-10-CM

## 2017-12-24 DIAGNOSIS — Z79899 Other long term (current) drug therapy: Secondary | ICD-10-CM | POA: Diagnosis not present

## 2017-12-24 DIAGNOSIS — F32A Depression, unspecified: Secondary | ICD-10-CM

## 2017-12-24 DIAGNOSIS — F4310 Post-traumatic stress disorder, unspecified: Secondary | ICD-10-CM | POA: Diagnosis present

## 2017-12-24 DIAGNOSIS — E039 Hypothyroidism, unspecified: Secondary | ICD-10-CM | POA: Insufficient documentation

## 2017-12-24 DIAGNOSIS — F609 Personality disorder, unspecified: Secondary | ICD-10-CM

## 2017-12-24 DIAGNOSIS — R140 Abdominal distension (gaseous): Secondary | ICD-10-CM | POA: Diagnosis not present

## 2017-12-24 LAB — ETHANOL: Alcohol, Ethyl (B): <10 mg/dL (ref <10)

## 2017-12-24 LAB — CBC
HCT: 38.3 % (ref 36.0–46.0)
Hemoglobin: 12.6 g/dL (ref 12.0–15.0)
MCH: 30.8 pg (ref 26.0–34.0)
MCHC: 32.9 g/dL (ref 30.0–36.0)
MCV: 93.6 fL (ref 80.0–100.0)
Platelets: 261 10*3/uL (ref 150–400)
RBC: 4.09 MIL/uL (ref 3.87–5.11)
RDW: 14.3 % (ref 11.5–15.5)
WBC: 4.4 10*3/uL (ref 4.0–10.5)
nRBC: 0 % (ref 0.0–0.2)

## 2017-12-24 LAB — COMPREHENSIVE METABOLIC PANEL
ALK PHOS: 82 U/L (ref 38–126)
ALT: 19 U/L (ref 0–44)
AST: 23 U/L (ref 15–41)
Albumin: 4.1 g/dL (ref 3.5–5.0)
Anion gap: 5 (ref 5–15)
BUN: 11 mg/dL (ref 8–23)
CALCIUM: 8.9 mg/dL (ref 8.9–10.3)
CO2: 27 mmol/L (ref 22–32)
Chloride: 104 mmol/L (ref 98–111)
Creatinine, Ser: 0.78 mg/dL (ref 0.44–1.00)
GFR calc Af Amer: >60 mL/min (ref >60)
GFR calc non Af Amer: >60 mL/min (ref >60)
Glucose, Bld: 87 mg/dL (ref 70–99)
Potassium: 4.1 mmol/L (ref 3.5–5.1)
Sodium: 136 mmol/L (ref 135–145)
Total Bilirubin: 0.4 mg/dL (ref 0.3–1.2)
Total Protein: 6.9 g/dL (ref 6.5–8.1)

## 2017-12-24 LAB — URINALYSIS, COMPLETE (UACMP) WITH MICROSCOPIC
Bacteria, UA: NONE SEEN
Bilirubin Urine: NEGATIVE
Glucose, UA: NEGATIVE mg/dL
Hgb urine dipstick: NEGATIVE
Ketones, ur: NEGATIVE mg/dL
Leukocytes, UA: NEGATIVE
NITRITE: NEGATIVE
Protein, ur: NEGATIVE mg/dL
Specific Gravity, Urine: 1.002 — ABNORMAL LOW (ref 1.005–1.030)
pH: 7 (ref 5.0–8.0)

## 2017-12-24 LAB — URINE DRUG SCREEN, QUALITATIVE (ARMC ONLY)
Amphetamines, Ur Screen: NOT DETECTED
Barbiturates, Ur Screen: NOT DETECTED
Benzodiazepine, Ur Scrn: NOT DETECTED
COCAINE METABOLITE, UR ~~LOC~~: NOT DETECTED
Cannabinoid 50 Ng, Ur ~~LOC~~: NOT DETECTED
MDMA (Ecstasy)Ur Screen: NOT DETECTED
Methadone Scn, Ur: NOT DETECTED
Opiate, Ur Screen: NOT DETECTED
Phencyclidine (PCP) Ur S: NOT DETECTED
Tricyclic, Ur Screen: NOT DETECTED

## 2017-12-24 LAB — SALICYLATE LEVEL: Salicylate Lvl: <7 mg/dL (ref 2.8–30.0)

## 2017-12-24 LAB — ACETAMINOPHEN LEVEL: Acetaminophen (Tylenol), Serum: <10 ug/mL — ABNORMAL LOW (ref 10–30)

## 2017-12-24 MED ORDER — AMLODIPINE BESYLATE 5 MG PO TABS
5.0000 mg | ORAL_TABLET | Freq: Once | ORAL | Status: AC
  Administered 2017-12-24: 5 mg via ORAL
  Filled 2017-12-24: qty 1

## 2017-12-24 NOTE — ED Provider Notes (Signed)
[facility] Emergency Department Provider Note ____________________________________________   First MD Initiated Contact with Patient 12/24/17 1750     (approximate)  I have reviewed the triage vital signs and the nursing notes.   HISTORY  Chief Complaint Psychiatric Evaluation    HPI Avilyn Virtue is a 67 y.o. female with PMH as noted below who presents with multiple complaints.  Primarily the patient states that she has been increasingly depressed and despondent over the last several weeks to months.  She states that earlier this fall she had ECT for her depression, but then never had any follow-up and has had extreme difficulty taking care of herself.  She states that she is homeless and just got kicked out of the hotel where she was staying.  She states that she has not been on all of her medications, and was not able to arrange follow-up with a psychiatrist.  She states that she also has headache, urinary frequency, weakness, difficulty sleeping, and poor appetite.  She states that she talked to her primary care doctor as well as to [employer] care both of whom told her to come to the hospital.  Past Medical History:  Diagnosis Date  . Allergic rhinitis   . Anemia   . Blurred vision   . Depression   . Diverticulosis   . Endometriosis   . Falls   . GERD (gastroesophageal reflux disease)   . Hernia, hiatal   . Hypothyroidism   . IBS (irritable bowel syndrome)   . Malignant neoplasm of skin   . Migraine   . Neuropathy   . PTSD (post-traumatic stress disorder)   . Thyroid disease     Patient Active Problem List   Diagnosis Date Noted  . Medication monitoring encounter 12/07/2017  . Family history of high cholesterol 12/07/2017  . Arthritis of hand, degenerative 12/07/2017  . Bipolar I disorder, most recent episode mixed, severe with psychotic features ([place]) 09/18/2017  . Agitation 09/18/2017  . IBS (irritable bowel syndrome)  11/17/2016  . Bilateral carpal tunnel syndrome 11/17/2016  . Plantar fasciitis, bilateral 11/17/2016  . Chronic pain syndrome 11/17/2016  . Chronic hyponatremia 11/17/2016  . PTSD (post-traumatic stress disorder) 11/17/2016  . Hernia, hiatal 11/17/2016  . Vitamin D deficiency 11/17/2016  . Vitamin B12 deficiency 11/17/2016  . SIADH (syndrome of inappropriate ADH production) ([place]) 09/08/2016  . Cervical spondylosis with myelopathy 05/24/2016  . Post-concussion headache 05/24/2016  . MCI (mild cognitive impairment) with memory loss 03/30/2016  . Gait abnormality 03/30/2016  . Chronic bipolar affective disorder ([place]) 01/24/2016  . Anemia, iron deficiency 06/24/2015  . Endometriosis 06/24/2015  . Essential hypertension 06/24/2015  . History of postmenopausal bleeding 05/22/2015  . History of TIA (transient ischemic attack) 04/20/2015  . Hypothyroidism 04/20/2015  . GERD (gastroesophageal reflux disease) 04/20/2015  . Cervical disc disorder with radiculopathy 03/25/2015  . Chronic neck pain 03/25/2015  . Pelvic pain in female 10/30/2014  . History of skin cancer 06/30/2014  . Chronic headaches 12/16/2013  . Neuropathy 12/16/2013    Past Surgical History:  Procedure Laterality Date  . APPENDECTOMY    . CHOLECYSTECTOMY    . CHOLECYSTECTOMY, LAPAROSCOPIC    . ESOPHAGOGASTRODUODENOSCOPY (EGD) WITH PROPOFOL N/A 03/29/2015   Procedure: ESOPHAGOGASTRODUODENOSCOPY (EGD) WITH PROPOFOL;  Surgeon: Josefine Class, MD;  Location: [facility] ENDOSCOPY;  Service: Endoscopy;  Laterality: N/A;  . HERNIA REPAIR    . laproscopy    . TONSILLECTOMY    . uteral suspension  Prior to Admission medications   Medication Sig Start Date End Date Taking? Authorizing Provider  albuterol (PROVENTIL HFA;VENTOLIN HFA) 108 (90 Base) MCG/ACT inhaler Inhale 2 puffs into the lungs every 6 (six) hours as needed for wheezing or shortness of breath.   Yes [provider]  amLODipine (NORVASC) 5 MG tablet  Take 1 tablet (5 mg total) by mouth 2 (two) times daily. 11/06/17  Yes Clapacs, Madie Reno, MD  artificial tears (LACRILUBE) OINT ophthalmic ointment Place into both eyes every 8 (eight) hours. 11/06/17  Yes Clapacs, Madie Reno, MD  calcium carbonate (TUMS - DOSED IN MG ELEMENTAL CALCIUM) 500 MG chewable tablet Chew 2 tablets (400 mg of elemental calcium total) by mouth daily. 11/06/17  Yes Clapacs, Madie Reno, MD  cholecalciferol 1000 units tablet Take 1 tablet (1,000 Units total) by mouth daily. 11/07/17  Yes Clapacs, Madie Reno, MD  conjugated estrogens (PREMARIN) vaginal cream Place 1 Applicatorful vaginally daily as needed.   Yes [provider]  cycloSPORINE (RESTASIS) 0.05 % ophthalmic emulsion Place 1 drop into both eyes 2 (two) times daily. 11/06/17  Yes Clapacs, Madie Reno, MD  diclofenac sodium (VOLTAREN) 1 % GEL Apply 4 g topically 4 (four) times daily. 11/06/17  Yes Clapacs, Madie Reno, MD  ferrous sulfate (FERROUSUL) 325 (65 FE) MG tablet Take 1 tablet (325 mg total) by mouth daily with breakfast. 11/06/17  Yes Clapacs, Madie Reno, MD  fluorometholone (FML) 0.1 % ophthalmic suspension Place 1 drop into both eyes 2 (two) times daily.   Yes [provider]  gabapentin (NEURONTIN) 100 MG capsule Take 1 capsule (100 mg total) by mouth 2 (two) times daily. 11/06/17  Yes Clapacs, Madie Reno, MD  Icosapent Ethyl (VASCEPA) 1 g CAPS Two capsules by mouth twice a day (to help with triglycerides) 12/12/17  Yes Lada, Satira Anis, MD  ipratropium (ATROVENT) 0.03 % nasal spray Place 2 sprays into both nostrils 3 (three) times daily. 11/06/17  Yes Clapacs, Madie Reno, MD  lactulose (CHRONULAC) 10 GM/15ML solution Take 15 mLs (10 g total) by mouth daily as needed for mild constipation. 12/12/17  Yes Lada, Satira Anis, MD  levalbuterol (XOPENEX) 0.63 MG/3ML nebulizer solution Take 3 mLs (0.63 mg total) by nebulization daily at 2 PM. 11/07/17  Yes Clapacs, Madie Reno, MD  levothyroxine (SYNTHROID, LEVOTHROID) 100 MCG tablet Take 1  tablet (100 mcg total) by mouth daily before breakfast. 11/06/17  Yes Clapacs, Madie Reno, MD  linaclotide [facility]) 290 MCG CAPS capsule Take 1 capsule (290 mcg total) by mouth daily before breakfast. 11/07/17  Yes Clapacs, Madie Reno, MD  LORazepam (ATIVAN) 0.5 MG tablet Take 1 tablet (0.5 mg total) by mouth every 6 (six) hours as needed for anxiety. 11/06/17  Yes Clapacs, Madie Reno, MD  magnesium oxide (MAG-OX) 400 (241.3 Mg) MG tablet Take 0.5 tablets (200 mg total) by mouth 2 (two) times daily. 11/06/17  Yes Clapacs, Madie Reno, MD  Multiple Vitamin (MULTIVITAMIN WITH MINERALS) TABS tablet Take 1 tablet by mouth daily. 11/07/17  Yes Clapacs, Madie Reno, MD  PARoxetine (PAXIL-CR) 25 MG 24 hr tablet Take 1 tablet (25 mg total) by mouth daily. 11/07/17  Yes Clapacs, Madie Reno, MD  prazosin (MINIPRESS) 2 MG capsule Take 1 capsule (2 mg total) by mouth at bedtime. 11/06/17  Yes Clapacs, Madie Reno, MD  predniSONE (DELTASONE) 5 MG tablet Take 5 mg by mouth daily. 12/11/17  Yes [provider]  pyridOXINE (B-6) 100 MG tablet Take 1 tablet (100 mg total) by  mouth 2 (two) times daily. 11/06/17  Yes Clapacs, Madie Reno, MD  QUEtiapine (SEROQUEL) 100 MG tablet Take 0.5 tablets (50 mg total) by mouth at bedtime. 12/12/17  Yes Lada, Satira Anis, MD  tiZANidine (ZANAFLEX) 2 MG tablet Take 1 tablet (2 mg total) by mouth every 8 (eight) hours as needed for muscle spasms. 11/06/17  Yes Clapacs, Madie Reno, MD  traZODone (DESYREL) 100 MG tablet Take 0.5 tablets (50 mg total) by mouth at bedtime. 12/12/17  Yes Lada, Satira Anis, MD  triamcinolone cream (KENALOG) 0.1 % Apply topically 3 (three) times daily as needed. 11/06/17  Yes Clapacs, Madie Reno, MD  vitamin C (VITAMIN C) 1000 MG tablet Take 1 tablet (1,000 mg total) by mouth daily. 11/07/17  Yes Clapacs, Madie Reno, MD  Aromatic Inhalants (VICKS VAPOR IN) Inhale into the lungs as needed.    [provider]  Biotin 5000 MCG TABS Two tablets by mouth daily Patient not taking: Reported on  12/24/2017 12/12/17   Arnetha Courser, MD  ciclopirox (LOPROX) 0.77 % cream Apply topically 2 (two) times daily.    [provider]  citalopram (CELEXA) 20 MG tablet Take 20 mg by mouth daily.    [provider]  guaiFENesin (MUCINEX) 600 MG 12 hr tablet Take 1 tablet (600 mg total) by mouth 2 (two) times daily. Patient not taking: Reported on 12/24/2017 11/07/17   Clapacs, Madie Reno, MD  hydrocortisone cream 1 % Apply topically 2 (two) times daily. Patient not taking: Reported on 12/24/2017 11/06/17   Clapacs, Madie Reno, MD  lidocaine (LIDODERM) 5 % Place 1 patch onto the skin daily. Remove & Discard patch within 12 hours or as directed by MD Patient not taking: Reported on 12/24/2017 11/07/17   Clapacs, Madie Reno, MD  omeprazole (PRILOSEC) 20 MG capsule Take 20 mg by mouth daily.    [provider]  permethrin (ELIMITE) 5 % cream Apply 1 application topically daily as needed. Patient not taking: Reported on 12/24/2017 12/07/17   Arnetha Courser, MD  polyethylene glycol (MIRALAX / GLYCOLAX) packet Take 17 g by mouth daily. Patient not taking: Reported on 12/24/2017 11/07/17   Clapacs, Madie Reno, MD  polyvinyl alcohol (LIQUIFILM TEARS) 1.4 % ophthalmic solution Place 1 drop into both eyes as needed for dry eyes. Patient not taking: Reported on 12/24/2017 11/06/17   Clapacs, Madie Reno, MD  promethazine (PHENERGAN) 12.5 MG tablet Take 12.5 mg by mouth as needed for nausea or vomiting.    [provider]  UNABLE TO FIND as needed. Med Name: Bio Freeze    [provider]    Allergies Bee venom; Darvon [propoxyphene]; Dicyclomine; Other; Oxycodone-acetaminophen; Peanuts [peanut oil]; Penicillins; Sulfa antibiotics; Sulfacetamide sodium; Ultram [tramadol]; Amikacin; Aspirin; Doxycycline; Haloperidol; Hymenoptera venom preparations; Imipramine; Keflex [cephalexin]; Lactose intolerance (gi); Sulfur; Adhesive [tape]; Albuterol; Ciprofloxacin; Hydroxyzine; Peanut butter flavor; and  Prednisone  Family History  Problem Relation Age of Onset  . Heart disease Father   . Hearing loss Father   . COPD Father   . Depression Father   . Stroke Father   . Vision loss Father   . Varicose Veins Father   . Cancer Mother        lung  . Depression Sister   . Arthritis Sister   . Arthritis Brother   . Hernia Brother   . Anxiety disorder Brother   . Arthritis Brother   . Hernia Brother   . Anxiety disorder Brother   . Urolithiasis Neg Hx   .  Kidney disease Neg Hx   . Kidney cancer Neg Hx   . Prostate cancer Neg Hx     Social History Social History   Tobacco Use  . Smoking status: Former Smoker    Last attempt to quit: 11/17/1976    Years since quitting: 41.1  . Smokeless tobacco: Never Used  Substance Use Topics  . Alcohol use: No  . Drug use: No    Review of Systems  Constitutional: No fever. Eyes: No visual changes. ENT: No sore throat. Cardiovascular: Denies chest pain. Respiratory: Denies shortness of breath. Gastrointestinal: No vomiting or diarrhea.  Genitourinary: Negative for dysuria.  Musculoskeletal: Negative for back pain. Skin: Negative for rash. Neurological: Positive for headache.   ____________________________________________   PHYSICAL EXAM:  VITAL SIGNS: ED Triage Vitals  Enc Vitals Group     BP 12/24/17 1711 (!) 163/86     Pulse Rate 12/24/17 1711 64     Resp 12/24/17 1711 20     Temp 12/24/17 1711 97.9 F (36.6 C)     Temp Source 12/24/17 1711 Oral     SpO2 12/24/17 1711 99 %     Weight 12/24/17 1711 140 lb (63.5 kg)     Height 12/24/17 1711 5\' 3"  (1.6 m)     Head Circumference --      Peak Flow --      Pain Score 12/24/17 1735 0     Pain Loc --      Pain Edu? --      Excl. in [place]? --     Constitutional: Alert and oriented. Well appearing and in no acute distress. Eyes: Conjunctivae are normal.  Head: Atraumatic. Nose: No congestion/rhinnorhea. Mouth/Throat: Mucous membranes are moist.   Neck: Normal range of  motion.  Cardiovascular: Normal rate, regular rhythm. Grossly normal heart sounds.  Good peripheral circulation. Respiratory: Normal respiratory effort.  No retractions. Lungs CTAB. Gastrointestinal: No distention.  Musculoskeletal: Extremities warm and well perfused.  Neurologic:  Normal speech and language. No gross focal neurologic deficits are appreciated.  Skin:  Skin is warm and dry. No rash noted. Psychiatric: Calm and cooperative, but anxious appearing.  ____________________________________________   LABS (all labs ordered are listed, but only abnormal results are displayed)  Labs Reviewed  ACETAMINOPHEN LEVEL - Abnormal; Notable for the following components:      Result Value   Acetaminophen (Tylenol), Serum <10 (*)    All other components within normal limits  URINALYSIS, COMPLETE (UACMP) WITH MICROSCOPIC - Abnormal; Notable for the following components:   Color, Urine STRAW (*)    APPearance CLEAR (*)    Specific Gravity, Urine 1.002 (*)    All other components within normal limits  COMPREHENSIVE METABOLIC PANEL  ETHANOL  SALICYLATE LEVEL  CBC  URINE DRUG SCREEN, QUALITATIVE ([facility] ONLY)   ____________________________________________  EKG   ____________________________________________  RADIOLOGY    ____________________________________________   PROCEDURES  Procedure(s) performed: No  Procedures  Critical Care performed: No ____________________________________________   INITIAL IMPRESSION / ASSESSMENT AND PLAN / ED COURSE  Pertinent labs & imaging results that were available during my care of the patient were reviewed by me and considered in my medical decision making (see chart for details).  67 year old female with PMH as noted above presents with worsening depression as well as multiple medical symptoms and requesting psychiatric care.  She denies SI or HI.  The patient is slightly disorganized with her history however it seems that the specific  precipitating factor that brought her to  the ED today was that she was kicked out of the hotel where she was staying.  She states she has been in conversation with her primary care doctor Dr. Sanda Klein and I do see some notes from telephone contact in [place].  On further review of her past records the patient was most recently discharged in October.  She was seen again in the ED on 10/28 and released.  On exam currently the patient is relatively well-appearing and her vital signs are normal except for hypertension.  Patient requests the amlodipine that she normally takes.  Her exam is otherwise unremarkable.  At this time, the patient endorses worsening depression but denies SI or HI.  She does not appear to be an acute danger to herself or others and wants to be here.  Therefore there is no indication for involuntary commitment.  Given her age medical history, and varied complaints I will obtain a screening lab work-up.  I think she would benefit from psychiatric consultation in the morning with Dr. Weber Cooks who took care of her in the hospital and possible social work evaluation.  If the medical work-up is unremarkable, disposition will be determined by the psychiatry team.  ----------------------------------------- 11:41 PM on 12/24/2017 -----------------------------------------  The lab work-up is unremarkable.  The patient continues to remain calm and comfortable appearing.  I am signing the patient out to oncoming physician.  The plan will be psychiatric and social work evaluation in the morning.  Disposition per psychiatric team recommendations. ____________________________________________   FINAL CLINICAL IMPRESSION(S) / ED DIAGNOSES  Final diagnoses:  Depression, unspecified depression type      NEW MEDICATIONS STARTED DURING THIS VISIT:  New Prescriptions   No medications on file     Note:  This document was prepared using Dragon voice recognition software and may include  unintentional dictation errors.    Arta Silence, MD 12/24/17 2342
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
## 2017-12-24 NOTE — ED Notes (Addendum)
Pt dressed out into appropriate behavioral health clothing with this tech and Melanie,NT in the rm. Pt belongings consist of gray tennis shoes, a purple jacket, green pants, orange/green shirt, a burgundy scarf, a orange water bottle, black socks, two black braces on feet, two tan braces on feet, white panties, pt has a watch that was placed in her purse and a gray/white shirt. Pt has several bags of belongings with her including

## 2017-12-24 NOTE — Telephone Encounter (Signed)
Copied from [place] [phone]. Topic: Quick Communication - See Telephone Encounter >> Dec 24, 2017  4:50 PM Angela [name], Helene Kelp D wrote: CRM for notification. See Telephone encounter for: 12/24/17. Pt called and said that she is in the ED right now and would like a call back from Dr. Sanda Klein before she leaves if its possible. Please call pt back, thanks.
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
## 2017-12-24 NOTE — ED Triage Notes (Addendum)
Pt denies all SI/HI. Pt also reports he boyfriend was also cheating on her with her sister. Pt agitated with loud and pressured speech in triage.

## 2017-12-24 NOTE — ED Triage Notes (Signed)
Refusing to take off copper gloves in triage.

## 2017-12-25 DIAGNOSIS — Z9119 Patient's noncompliance with other medical treatment and regimen: Secondary | ICD-10-CM

## 2017-12-25 DIAGNOSIS — F319 Bipolar disorder, unspecified: Secondary | ICD-10-CM | POA: Diagnosis not present

## 2017-12-25 DIAGNOSIS — F609 Personality disorder, unspecified: Secondary | ICD-10-CM

## 2017-12-25 DIAGNOSIS — Z91199 Patient's noncompliance with other medical treatment and regimen due to unspecified reason: Secondary | ICD-10-CM

## 2017-12-25 DIAGNOSIS — F3164 Bipolar disorder, current episode mixed, severe, with psychotic features: Secondary | ICD-10-CM

## 2017-12-25 HISTORY — DX: Patient's noncompliance with other medical treatment and regimen due to unspecified reason: Z91.199

## 2017-12-25 HISTORY — DX: Patient's noncompliance with other medical treatment and regimen: Z91.19

## 2017-12-25 MED ORDER — LEVOTHYROXINE SODIUM 75 MCG PO TABS
100.0000 ug | ORAL_TABLET | Freq: Every day | ORAL | Status: DC
  Administered 2017-12-25 – 2017-12-29 (×4): 100 ug via ORAL
  Filled 2017-12-25 (×5): qty 1

## 2017-12-25 MED ORDER — PAROXETINE HCL ER 12.5 MG PO TB24
25.0000 mg | ORAL_TABLET | Freq: Every day | ORAL | Status: DC
  Administered 2017-12-25 – 2017-12-29 (×5): 25 mg via ORAL
  Filled 2017-12-25 (×8): qty 2

## 2017-12-25 MED ORDER — SENNA 8.6 MG PO TABS
2.0000 | ORAL_TABLET | Freq: Once | ORAL | Status: AC
  Administered 2017-12-25: 17.2 mg via ORAL
  Filled 2017-12-25: qty 2

## 2017-12-25 MED ORDER — TRAZODONE HCL 50 MG PO TABS
50.0000 mg | ORAL_TABLET | Freq: Every day | ORAL | Status: DC
  Administered 2017-12-25 – 2017-12-29 (×6): 50 mg via ORAL
  Filled 2017-12-25 (×7): qty 1

## 2017-12-25 MED ORDER — AMLODIPINE BESYLATE 5 MG PO TABS
5.0000 mg | ORAL_TABLET | Freq: Every day | ORAL | Status: DC
  Administered 2017-12-25 – 2017-12-30 (×6): 5 mg via ORAL
  Filled 2017-12-25 (×7): qty 1

## 2017-12-25 MED ORDER — QUETIAPINE FUMARATE 25 MG PO TABS
25.0000 mg | ORAL_TABLET | Freq: Every day | ORAL | Status: DC
  Administered 2017-12-29: 25 mg via ORAL
  Filled 2017-12-25 (×5): qty 1

## 2017-12-25 MED ORDER — PRAZOSIN HCL 2 MG PO CAPS
2.0000 mg | ORAL_CAPSULE | Freq: Every day | ORAL | Status: DC
  Administered 2017-12-25 – 2017-12-29 (×5): 2 mg via ORAL
  Filled 2017-12-25 (×3): qty 1
  Filled 2017-12-25 (×2): qty 2
  Filled 2017-12-25 (×4): qty 1

## 2017-12-25 NOTE — Telephone Encounter (Signed)
Tried to leave voicemail but was full

## 2017-12-25 NOTE — ED Notes (Signed)
Hourly rounding reveals patient in room. No complaints, stable, in no acute distress. Q15 minute rounds and monitoring via Security Cameras to continue. 

## 2017-12-25 NOTE — Consult Note (Signed)
[facility] Consult   Reason for Consult: Consult for this 67 year old woman with a history of chronic mental illness who comes to the emergency room with helplessness Referring Physician: Reita Cliche Patient Identification: Angela [name] MRN:  [id number] Principal Diagnosis: Bipolar I disorder, most recent episode mixed, severe with psychotic features ([place]) Diagnosis:  Principal Problem:   Bipolar I disorder, most recent episode mixed, severe with psychotic features ([place]) Active Problems:   Hypothyroidism   Essential hypertension   PTSD (post-traumatic stress disorder)   Personality disorder ([place])   Noncompliance   Total Time spent with patient: 1 hour  Subjective:   Angela [name] is a 67 y.o. female patient admitted with "I do not have anywhere to go".  HPI: Patient seen chart reviewed.  Patient known for many prior encounters.  This is a 66 year old woman with chronic mental illness.  She has both severe personality disorder as well as chronic mood instability.  She was discharged from our hospital about a month and a half ago.  At that time there were arrangements in place for her to go to live at a group home and have outpatient follow-up.  She did not go to the group home and instead has been staying in a hotel until she now has run through all of her current money.  Now she is homeless and comes in feeling helpless and overwhelmed.  Vague suicidal thoughts without specific threat.  Will not discuss many details of it.  Denies hallucinations but is very disorganized in her thinking at times.  As usual she has very poor insight and poor judgment and cannot stay on topic to discuss a reasonable treatment plan.  Multiple somatic complaints as well which is chronic for her.  Social history: Patient does have some family living in the [place] but they have essentially wash their hands over.  She gets some money from a trust fund from her father but otherwise the  family has little contact with her.  Medical history: Multiple medical problems including high blood pressure hypothyroidism multiple orthopedic complaints and chronic pain  Substance abuse history: No alcohol or drug abuse  Past Psychiatric History: Longstanding mental health problems essentially lifelong multiple hospitalizations.  Patient has shown some slight improvement when she was getting ECT and a little bit of improvement sometimes with mood stabilizers but her insight is very poor and she refuses to stay on appropriate mood stabilizers outside the hospital.  She has a history of poor self-care I do not believe she is ever seriously tried to kill her self no violence to others.  Poor cooperation with any treatment planning  Risk to Self: Suicidal Ideation: No Suicidal Intent: No Is patient at risk for suicide?: No Suicidal Plan?: No Access to Means: No What has been your use of drugs/alcohol within the last 12 months?: Unknown Other Self Harm Risks: denied Triggers for Past Attempts: None known Intentional Self Injurious Behavior: None Risk to Others: Homicidal Ideation: No Thoughts of Harm to Others: No Current Homicidal Intent: No Current Homicidal Plan: No Access to Homicidal Means: No Identified Victim: None identified History of harm to others?: No Assessment of Violence: None Noted Does patient have access to weapons?: No Criminal Charges Pending?: No Does patient have a court date: No Prior Inpatient Therapy: Prior Inpatient Therapy: Yes Prior Therapy Dates: 2019 and prior Prior Therapy Facilty/Provider(s): [facility] Reason for Treatment: Bipolar Disorder Prior Outpatient Therapy: Prior Outpatient Therapy: Yes Prior Therapy Dates: Current Prior Therapy Facilty/Provider(s): [facility] Reason for Treatment: Bipolar Disorder Does patient have an ACCT team?: No Does patient have Intensive In-House Services?  : No Does patient have [facility] services? :  No Does patient have P4CC services?: No  Past Medical History:  Past Medical History:  Diagnosis Date  . Allergic rhinitis   . Anemia   . Blurred vision   . Depression   . Diverticulosis   . Endometriosis   . Falls   . GERD (gastroesophageal reflux disease)   . Hernia, hiatal   . Hypothyroidism   . IBS (irritable bowel syndrome)   . Malignant neoplasm of skin   . Migraine   . Neuropathy   . PTSD (post-traumatic stress disorder)   . Thyroid disease     Past Surgical History:  Procedure Laterality Date  . APPENDECTOMY    . CHOLECYSTECTOMY    . CHOLECYSTECTOMY, LAPAROSCOPIC    . ESOPHAGOGASTRODUODENOSCOPY (EGD) WITH PROPOFOL N/A 03/29/2015   Procedure: ESOPHAGOGASTRODUODENOSCOPY (EGD) WITH PROPOFOL;  Surgeon: Josefine Class, MD;  Location: [facility] ENDOSCOPY;  Service: Endoscopy;  Laterality: N/A;  . HERNIA REPAIR    . laproscopy    . TONSILLECTOMY    . uteral suspension     Family History:  Family History  Problem Relation Age of Onset  . Heart disease Father   . Hearing loss Father   . COPD Father   . Depression Father   . Stroke Father   . Vision loss Father   . Varicose Veins Father   . Cancer Mother        lung  . Depression Sister   . Arthritis Sister   . Arthritis Brother   . Hernia Brother   . Anxiety disorder Brother   . Arthritis Brother   . Hernia Brother   . Anxiety disorder Brother   . Urolithiasis Neg Hx   . Kidney disease Neg Hx   . Kidney cancer Neg Hx   . Prostate cancer Neg Hx    Family Psychiatric  History: Reportedly there is some family history of mental illness possibly bipolar disorder Social History:  Social History   Substance and Sexual Activity  Alcohol Use No     Social History   Substance and Sexual Activity  Drug Use No    Social History   Socioeconomic History  . Marital status: Single    Spouse name: Not on file  . Number of children: 0  . Years of education: 1.5 years of college  . Highest education level:  Some college, no degree  Occupational History  . Occupation: Retired  Scientific laboratory technician  . Financial resource strain: Very hard  . Food insecurity:    Worry: Often true    Inability: Often true  . Transportation needs:    Medical: Yes    Non-medical: Yes  Tobacco Use  . Smoking status: Former Smoker    Last attempt to quit: 11/17/1976    Years since quitting: 41.1  . Smokeless tobacco: Never Used  Substance and Sexual Activity  . Alcohol use: No  . Drug use: No  . Sexual activity: Not Currently  Lifestyle  . Physical activity:    Days per week: 0 days    Minutes per session: 0 min  . Stress: Very much  Relationships  . Social connections:    Talks on phone: Never    Gets together: Never    Attends religious service: More than 4 times per year    Active member of club or organization:  No    Attends meetings of clubs or organizations: Never    Relationship status: Not on file  Other Topics Concern  . Not on file  Social History Narrative   Lives at home alone.   Right-handed.   No daily caffeine use.   Additional Social History:    Allergies:   Allergies  Allergen Reactions  . Bee Venom Hives  . Darvon [Propoxyphene] Anaphylaxis and Hives    Hives/throat swelling   . Dicyclomine Itching  . Other Hives    Spider bites cause hives  . Oxycodone-Acetaminophen Nausea And Vomiting and Other (See Comments)    hallucinations   . Peanuts [Peanut Oil] Anaphylaxis and Hives  . Penicillins Hives    Has patient had a PCN reaction causing immediate rash, facial/tongue/throat swelling, SOB or lightheadedness with hypotension: Unknown Has patient had a PCN reaction causing severe rash involving mucus membranes or skin necrosis: Unknown Has patient had a PCN reaction that required hospitalization: Unknown Has patient had a PCN reaction occurring within the last 10 years: Unknown If all of the above answers are "NO", then may proceed with Cephalosporin use.  . Sulfa Antibiotics  Anaphylaxis, Hives and Itching  . Sulfacetamide Sodium Anaphylaxis, Hives and Itching  . Ultram [Tramadol] Hives  . Amikacin Nausea And Vomiting  . Aspirin Hives  . Doxycycline Photosensitivity  . Haloperidol Nausea And Vomiting  . Hymenoptera Venom Preparations Hives    Reaction to spider bites  . Imipramine Hives    Reaction to Tofranil  . Keflex [Cephalexin] Hives  . Lactose Intolerance (Gi)   . Sulfur Hives  . Adhesive [Tape] Hives and Rash  . Albuterol Palpitations  . Ciprofloxacin Other (See Comments)    tendonopathy  . Hydroxyzine Anxiety and Itching  . Peanut Butter Flavor Anxiety  . Prednisone Nausea And Vomiting, Anxiety and Other (See Comments)    Crazy mood swings, strange thoughts (pt does tolerate cortisone injections)    Labs:  Results for orders placed or performed during the hospital encounter of 12/24/17 (from the past 48 hour(s))  Comprehensive metabolic panel     Status: None   Collection Time: 12/24/17  6:35 PM  Result Value Ref Range   Sodium 136 135 - 145 mmol/L   Potassium 4.1 3.5 - 5.1 mmol/L   Chloride 104 98 - 111 mmol/L   CO2 27 22 - 32 mmol/L   Glucose, Bld 87 70 - 99 mg/dL   BUN 11 8 - 23 mg/dL   Creatinine, Ser 0.78 0.44 - 1.00 mg/dL   Calcium 8.9 8.9 - 10.3 mg/dL   Total Protein 6.9 6.5 - 8.1 g/dL   Albumin 4.1 3.5 - 5.0 g/dL   AST 23 15 - 41 U/L   ALT 19 0 - 44 U/L   Alkaline Phosphatase 82 38 - 126 U/L   Total Bilirubin 0.4 0.3 - 1.2 mg/dL   GFR calc non Af Amer >60 >60 mL/min   GFR calc Af Amer >60 >60 mL/min   Anion gap 5 5 - 15    Comment: Performed at [facility], [address] [place], [place] [address]  Ethanol     Status: None   Collection Time: 12/24/17  6:35 PM  Result Value Ref Range   Alcohol, Ethyl (B) <10 <10 mg/dL    Comment: (NOTE) Lowest detectable limit for serum alcohol is 10 mg/dL. For medical purposes only. Performed at [facility], [address] [place], [place] [address]    Salicylate  level     Status: None   Collection Time: 12/24/17  6:35 PM  Result Value Ref Range   Salicylate Lvl <4.0 2.8 - 30.0 mg/dL    Comment: Performed at [facility], [address] [place], [place] [address]  Acetaminophen level     Status: Abnormal   Collection Time: 12/24/17  6:35 PM  Result Value Ref Range   Acetaminophen (Tylenol), Serum <10 (L) 10 - 30 ug/mL    Comment: (NOTE) Therapeutic concentrations vary significantly. A range of 10-30 ug/mL  may be an effective concentration for many patients. However, some  are best treated at concentrations outside of this range. Acetaminophen concentrations >150 ug/mL at 4 hours after ingestion  and >50 ug/mL at 12 hours after ingestion are often associated with  toxic reactions. Performed at [facility], [address] [place], [place] [address]   cbc     Status: None   Collection Time: 12/24/17  6:35 PM  Result Value Ref Range   WBC 4.4 4.0 - 10.5 K/uL   RBC 4.09 3.87 - 5.11 MIL/uL   Hemoglobin 12.6 12.0 - 15.0 g/dL   HCT 38.3 36.0 - 46.0 %   MCV 93.6 80.0 - 100.0 fL   MCH 30.8 26.0 - 34.0 pg   MCHC 32.9 30.0 - 36.0 g/dL   RDW 14.3 11.5 - 15.5 %   Platelets 261 150 - 400 K/uL   nRBC 0.0 0.0 - 0.2 %    Comment: Performed at [facility], [address], [place], [place] [address]  Urine Drug Screen, Qualitative     Status: None   Collection Time: 12/24/17  6:37 PM  Result Value Ref Range   Tricyclic, Ur Screen NONE DETECTED NONE DETECTED   Amphetamines, Ur Screen NONE DETECTED NONE DETECTED   MDMA (Ecstasy)Ur Screen NONE DETECTED NONE DETECTED   Cocaine Metabolite,Ur [place] NONE DETECTED NONE DETECTED   Opiate, Ur Screen NONE DETECTED NONE DETECTED   Phencyclidine (PCP) Ur S NONE DETECTED NONE DETECTED   Cannabinoid 50 Ng, Ur [place] NONE DETECTED NONE DETECTED   Barbiturates, Ur Screen NONE DETECTED NONE DETECTED   Benzodiazepine, Ur Scrn NONE DETECTED NONE DETECTED   Methadone Scn, Ur NONE  DETECTED NONE DETECTED    Comment: (NOTE) Tricyclics + metabolites, urine    Cutoff 1000 ng/mL Amphetamines + metabolites, urine  Cutoff 1000 ng/mL MDMA (Ecstasy), urine              Cutoff 500 ng/mL Cocaine Metabolite, urine          Cutoff 300 ng/mL Opiate + metabolites, urine        Cutoff 300 ng/mL Phencyclidine (PCP), urine         Cutoff 25 ng/mL Cannabinoid, urine                 Cutoff 50 ng/mL Barbiturates + metabolites, urine  Cutoff 200 ng/mL Benzodiazepine, urine              Cutoff 200 ng/mL Methadone, urine                   Cutoff 300 ng/mL The urine drug screen provides only a preliminary, unconfirmed analytical test result and should not be used for non-medical purposes. Clinical consideration and professional judgment should be applied to any positive drug screen result due to possible interfering substances. A more specific alternate chemical method must be used in order to obtain a confirmed analytical result. Gas chromatography /  mass spectrometry (GC/MS) is the preferred confirmat ory method. Performed at [facility] [address] [place], [place] [address]   Urinalysis, Complete w Microscopic     Status: Abnormal   Collection Time: 12/24/17  6:37 PM  Result Value Ref Range   Color, Urine STRAW (A) YELLOW   APPearance CLEAR (A) CLEAR   Specific Gravity, Urine 1.002 (L) 1.005 - 1.030   pH 7.0 5.0 - 8.0   Glucose, UA NEGATIVE NEGATIVE mg/dL   Hgb urine dipstick NEGATIVE NEGATIVE   Bilirubin Urine NEGATIVE NEGATIVE   Ketones, ur NEGATIVE NEGATIVE mg/dL   Protein, ur NEGATIVE NEGATIVE mg/dL   Nitrite NEGATIVE NEGATIVE   Leukocytes, UA NEGATIVE NEGATIVE   WBC, UA 0-5 0 - 5 WBC/hpf   Bacteria, UA NONE SEEN NONE SEEN   Squamous Epithelial / LPF 0-5 0 - 5    Comment: Performed at [facility], [address], [place], [place] [address]    Current Facility-Administered Medications  Medication Dose Route Frequency Provider Last Rate  Last Dose  . amLODipine (NORVASC) tablet 5 mg  5 mg Oral Daily Clapacs, Madie Reno, MD   5 mg at 12/25/17 1219  . levothyroxine (SYNTHROID, LEVOTHROID) tablet 100 mcg  100 mcg Oral Q0600 Clapacs, Madie Reno, MD   100 mcg at 12/25/17 1218  . PARoxetine (PAXIL-CR) 24 hr tablet 25 mg  25 mg Oral Daily Clapacs, John T, MD   25 mg at 12/25/17 1218  . prazosin (MINIPRESS) capsule 2 mg  2 mg Oral QHS Paulette Blanch, MD   2 mg at 12/25/17 0118  . QUEtiapine (SEROQUEL) tablet 25 mg  25 mg Oral QHS Paulette Blanch, MD      . traZODone (DESYREL) tablet 50 mg  50 mg Oral QHS Paulette Blanch, MD   50 mg at 12/25/17 0119   Current Outpatient Medications  Medication Sig Dispense Refill  . albuterol (PROVENTIL HFA;VENTOLIN HFA) 108 (90 Base) MCG/ACT inhaler Inhale 2 puffs into the lungs every 6 (six) hours as needed for wheezing or shortness of breath.    Marland Kitchen amLODipine (NORVASC) 5 MG tablet Take 1 tablet (5 mg total) by mouth 2 (two) times daily. 60 tablet 1  . artificial tears (LACRILUBE) OINT ophthalmic ointment Place into both eyes every 8 (eight) hours. 1 Tube 2  . calcium carbonate (TUMS - DOSED IN MG ELEMENTAL CALCIUM) 500 MG chewable tablet Chew 2 tablets (400 mg of elemental calcium total) by mouth daily. 30 tablet 1  . cholecalciferol 1000 units tablet Take 1 tablet (1,000 Units total) by mouth daily. 30 tablet 1  . conjugated estrogens (PREMARIN) vaginal cream Place 1 Applicatorful vaginally daily as needed.    . cycloSPORINE (RESTASIS) 0.05 % ophthalmic emulsion Place 1 drop into both eyes 2 (two) times daily. 0.4 mL 1  . diclofenac sodium (VOLTAREN) 1 % GEL Apply 4 g topically 4 (four) times daily. 1 Tube 1  . ferrous sulfate (FERROUSUL) 325 (65 FE) MG tablet Take 1 tablet (325 mg total) by mouth daily with breakfast. 90 tablet 1  . fluorometholone (FML) 0.1 % ophthalmic suspension Place 1 drop into both eyes 2 (two) times daily.    Marland Kitchen gabapentin (NEURONTIN) 100 MG capsule Take 1 capsule (100 mg total) by mouth 2  (two) times daily. 60 capsule 1  . Icosapent Ethyl (VASCEPA) 1 g CAPS Two capsules by mouth twice a day (to help with triglycerides) 120 capsule 11  . ipratropium (ATROVENT) 0.03 %  nasal spray Place 2 sprays into both nostrils 3 (three) times daily. 30 mL 12  . lactulose (CHRONULAC) 10 GM/15ML solution Take 15 mLs (10 g total) by mouth daily as needed for mild constipation. 473 mL 1  . levalbuterol (XOPENEX) 0.63 MG/3ML nebulizer solution Take 3 mLs (0.63 mg total) by nebulization daily at 2 PM. 3 mL 12  . levothyroxine (SYNTHROID, LEVOTHROID) 100 MCG tablet Take 1 tablet (100 mcg total) by mouth daily before breakfast. 30 tablet 1  . linaclotide (LINZESS) 290 MCG CAPS capsule Take 1 capsule (290 mcg total) by mouth daily before breakfast. 30 capsule 1  . LORazepam (ATIVAN) 0.5 MG tablet Take 1 tablet (0.5 mg total) by mouth every 6 (six) hours as needed for anxiety. 30 tablet 0  . magnesium oxide (MAG-OX) 400 (241.3 Mg) MG tablet Take 0.5 tablets (200 mg total) by mouth 2 (two) times daily. 60 tablet 1  . Multiple Vitamin (MULTIVITAMIN WITH MINERALS) TABS tablet Take 1 tablet by mouth daily. 30 tablet 1  . PARoxetine (PAXIL-CR) 25 MG 24 hr tablet Take 1 tablet (25 mg total) by mouth daily. 30 tablet 1  . prazosin (MINIPRESS) 2 MG capsule Take 1 capsule (2 mg total) by mouth at bedtime. 30 capsule 1  . predniSONE (DELTASONE) 5 MG tablet Take 5 mg by mouth daily.    Marland Kitchen pyridOXINE (B-6) 100 MG tablet Take 1 tablet (100 mg total) by mouth 2 (two) times daily. 60 tablet 1  . QUEtiapine (SEROQUEL) 100 MG tablet Take 0.5 tablets (50 mg total) by mouth at bedtime.    Marland Kitchen tiZANidine (ZANAFLEX) 2 MG tablet Take 1 tablet (2 mg total) by mouth every 8 (eight) hours as needed for muscle spasms. 90 tablet 1  . traZODone (DESYREL) 100 MG tablet Take 0.5 tablets (50 mg total) by mouth at bedtime.    . triamcinolone cream (KENALOG) 0.1 % Apply topically 3 (three) times daily as needed. 30 g 0  . vitamin C (VITAMIN C)  1000 MG tablet Take 1 tablet (1,000 mg total) by mouth daily. 30 tablet 1  . Aromatic Inhalants (VICKS VAPOR IN) Inhale into the lungs as needed.    . Biotin 5000 MCG TABS Two tablets by mouth daily (Patient not taking: Reported on 12/24/2017) 30 tablet   . ciclopirox (LOPROX) 0.77 % cream Apply topically 2 (two) times daily.    . citalopram (CELEXA) 20 MG tablet Take 20 mg by mouth daily.    Marland Kitchen guaiFENesin (MUCINEX) 600 MG 12 hr tablet Take 1 tablet (600 mg total) by mouth 2 (two) times daily. (Patient not taking: Reported on 12/24/2017) 30 tablet 0  . hydrocortisone cream 1 % Apply topically 2 (two) times daily. (Patient not taking: Reported on 12/24/2017) 30 g 0  . lidocaine (LIDODERM) 5 % Place 1 patch onto the skin daily. Remove & Discard patch within 12 hours or as directed by MD (Patient not taking: Reported on 12/24/2017) 30 patch 0  . omeprazole (PRILOSEC) 20 MG capsule Take 20 mg by mouth daily.    . permethrin (ELIMITE) 5 % cream Apply 1 application topically daily as needed. (Patient not taking: Reported on 12/24/2017) 60 g 0  . polyethylene glycol (MIRALAX / GLYCOLAX) packet Take 17 g by mouth daily. (Patient not taking: Reported on 12/24/2017) 30 each 0  . polyvinyl alcohol (LIQUIFILM TEARS) 1.4 % ophthalmic solution Place 1 drop into both eyes as needed for dry eyes. (Patient not taking: Reported on 12/24/2017) 15 mL 0  .  promethazine (PHENERGAN) 12.5 MG tablet Take 12.5 mg by mouth as needed for nausea or vomiting.    Marland Kitchen UNABLE TO FIND as needed. Med Name: Bio Freeze      Musculoskeletal: Strength & Muscle Tone: within normal limits Gait & Station: normal Patient leans: N/A  Psychiatric Specialty Exam: Physical Exam  Nursing note and vitals reviewed. Constitutional: She appears well-developed and well-nourished.  HENT:  Head: Normocephalic and atraumatic.  Eyes: Pupils are equal, round, and reactive to light. Conjunctivae are normal.  Neck: Normal range of motion.  Cardiovascular:  Regular rhythm and normal heart sounds.  Respiratory: Effort normal. No respiratory distress.  GI: Soft.  Musculoskeletal: Normal range of motion.  Neurological: She is alert.  Skin: Skin is warm and dry.  Psychiatric: Her mood appears anxious. Her affect is labile. Her speech is tangential. She is agitated. She is not aggressive. Thought content is not paranoid. Cognition and memory are impaired. She expresses impulsivity and inappropriate judgment. She exhibits a depressed mood. She expresses suicidal ideation. She expresses no homicidal ideation. She exhibits abnormal recent memory.    Review of Systems  Constitutional: Negative.   HENT: Negative.   Eyes: Negative.   Respiratory: Negative.   Cardiovascular: Negative.   Gastrointestinal: Positive for abdominal pain.  Musculoskeletal: Negative.   Skin: Negative.   Neurological: Negative.   Psychiatric/Behavioral: Positive for depression and suicidal ideas. Negative for hallucinations, memory loss and substance abuse. The patient is nervous/anxious and has insomnia.     Blood pressure (!) 144/71, pulse (!) 58, temperature 98.1 F (36.7 C), temperature source Oral, resp. rate 18, height 5\' 3"  (1.6 m), weight 63.5 kg, SpO2 100 %.Body mass index is 24.8 kg/m.  General Appearance: Disheveled  Eye Contact:  Fair  Speech:  Pressured  Volume:  Increased  Mood:  Angry, Hopeless and Irritable  Affect:  Labile  Thought Process:  Disorganized  Orientation:  Full (Time, Place, and Person)  Thought Content:  Rumination and Tangential  Suicidal Thoughts:  No  Homicidal Thoughts:  No  Memory:  Immediate;   Fair Recent;   Poor Remote;   Poor  Judgement:  Impaired  Insight:  Shallow  Psychomotor Activity:  Decreased  Concentration:  Concentration: Poor  Recall:  [place] of Knowledge:  Fair  Language:  Fair  Akathisia:  No  Handed:  Right  AIMS (if indicated):     Assets:  Desire for Improvement  ADL's:  Impaired  Cognition:  WNL   Sleep:        Treatment Plan Summary: Daily contact with patient to assess and evaluate symptoms and progress in treatment, Medication management and Plan Patient has chronic severe mental health problems but these are complicated by her noncompliance with recommended treatment and her noncompliance and lack of cooperation with even the most reasonable discharge planning.  Admission to the psychiatric unit tends to be counterproductive as it is almost impossible then to get her discharged.  I spoke with the assembled physicians and representatives of nursing and social work had a department meeting today and the agreement was [facility] as we are unlikely to be of much use to her in the short-term we would have in the hospital.  I will put in a social work consult and request that meanwhile continuing usual outpatient medications.  Disposition: Recommend psychiatric Inpatient admission when medically cleared.  Alethia Berthold, MD 12/25/2017 2:56 PM

## 2017-12-25 NOTE — ED Notes (Signed)
Pt believes she has scabies.  Pt showed RN a couple of small red bumps.  EDP made aware.   Maintained on 15 minute checks and observation by security camera for safety.

## 2017-12-25 NOTE — ED Provider Notes (Signed)
-----------------------------------------   6:38 AM on 12/25/2017 -----------------------------------------   Blood pressure (!) 163/86, pulse 64, temperature 97.9 F (36.6 C), temperature source Oral, resp. rate 20, height 5\' 3"  (1.6 m), weight 63.5 kg, SpO2 99 %.  The patient had no acute events since last update.  Calm and cooperative at this time.  Disposition is pending Psychiatry/Behavioral Medicine team recommendations.     Paulette Blanch, MD 12/25/17 [phone]

## 2017-12-25 NOTE — ED Notes (Signed)
Report to include Situation, Background, Assessment, and Recommendations received from Amy B. RN. Patient alert and oriented, warm and dry, in no acute distress. Patient denies SI, HI, AVH and pain. Patient made aware of Q15 minute rounds and security cameras for their safety. Patient instructed to come to me with needs or concerns. 

## 2017-12-25 NOTE — ED Notes (Signed)
Snack and beverage given. 

## 2017-12-25 NOTE — ED Notes (Addendum)
Pt allowed to retrieve phone numbers from her cell phone with RN and security present.   Maintained on 15 minute checks and observation by security camera for safety.

## 2017-12-25 NOTE — Telephone Encounter (Signed)
It looks like this phone note was created at 4:51 pm It was not received by me until mid-morning She is still in the ER per hospital records I'm waiting on a call back from another doctor, so I cannot call her right now Please call her and see how she's feeling, if she has any needs that we need to address as PCP; it sounds like psychiatry is going to see her and handle things

## 2017-12-25 NOTE — ED Notes (Signed)
Dr. Weber Cooks at bedside.  Maintained on 15 minute checks and observation by security camera for safety.

## 2017-12-25 NOTE — ED Notes (Signed)
Hourly rounding reveals patient sleeping in room. No complaints, stable, in no acute distress. Q15 minute rounds and monitoring via Security Cameras to continue. 

## 2017-12-26 DIAGNOSIS — F319 Bipolar disorder, unspecified: Secondary | ICD-10-CM | POA: Diagnosis not present

## 2017-12-26 MED ORDER — LIDOCAINE 5 % EX PTCH: 1.0000 | MEDICATED_PATCH | CUTANEOUS | 0 refills | Status: DC

## 2017-12-26 MED ORDER — MAGNESIUM CITRATE PO SOLN
1.0000 | Freq: Once | ORAL | Status: AC
  Administered 2017-12-26: 1 via ORAL
  Filled 2017-12-26: qty 296

## 2017-12-26 MED ORDER — DOCUSATE SODIUM 100 MG PO CAPS
100.0000 mg | ORAL_CAPSULE | Freq: Two times a day (BID) | ORAL | Status: DC
  Administered 2017-12-26 – 2017-12-29 (×8): 100 mg via ORAL
  Filled 2017-12-26 (×12): qty 1

## 2017-12-26 MED ORDER — LINACLOTIDE 145 MCG PO CAPS
145.0000 ug | ORAL_CAPSULE | Freq: Every day | ORAL | Status: DC
  Administered 2017-12-27 – 2017-12-29 (×3): 145 ug via ORAL
  Filled 2017-12-26 (×4): qty 1

## 2017-12-26 MED ORDER — CYANOCOBALAMIN 1000 MCG/ML IJ SOLN
1000.0000 ug | Freq: Once | INTRAMUSCULAR | Status: AC
  Administered 2017-12-26: 1000 ug via INTRAMUSCULAR
  Filled 2017-12-26: qty 1

## 2017-12-26 MED ORDER — CALCIUM CARBONATE ANTACID 500 MG PO CHEW: 1.0000 | CHEWABLE_TABLET | Freq: Three times a day (TID) | ORAL | 0 refills | Status: DC

## 2017-12-26 MED ORDER — PAROXETINE HCL ER 25 MG PO TB24: 25.0000 mg | ORAL_TABLET | Freq: Every day | ORAL | 0 refills | Status: DC

## 2017-12-26 MED ORDER — ENSURE ENLIVE PO LIQD
237.0000 mL | Freq: Two times a day (BID) | ORAL | Status: DC
  Administered 2017-12-26 – 2017-12-30 (×4): 237 mL via ORAL

## 2017-12-26 MED ORDER — POLYETHYLENE GLYCOL 3350 17 G PO PACK: 17.0000 g | PACK | Freq: Every day | ORAL | 0 refills | Status: DC

## 2017-12-26 MED ORDER — PYRIDOXINE HCL 100 MG PO TABS: 100.0000 mg | ORAL_TABLET | Freq: Two times a day (BID) | ORAL | 0 refills | Status: DC

## 2017-12-26 MED ORDER — CYCLOSPORINE 0.05 % OP EMUL: 1.0000 [drp] | Freq: Two times a day (BID) | OPHTHALMIC | 1 refills | Status: DC

## 2017-12-26 MED ORDER — ICOSAPENT ETHYL 1 G PO CAPS: ORAL_CAPSULE | ORAL | 0 refills | Status: DC

## 2017-12-26 MED ORDER — DOCUSATE SODIUM 100 MG PO CAPS: 100.0000 mg | ORAL_CAPSULE | Freq: Two times a day (BID) | ORAL | 0 refills | Status: DC

## 2017-12-26 MED ORDER — ADULT MULTIVITAMIN W/MINERALS CH: 1.0000 | ORAL_TABLET | Freq: Every day | ORAL | 1 refills | Status: DC

## 2017-12-26 MED ORDER — CHOLECALCIFEROL 25 MCG (1000 UT) PO TABS: 1000.0000 [IU] | ORAL_TABLET | Freq: Every day | ORAL | 1 refills | Status: DC

## 2017-12-26 MED ORDER — POLYVINYL ALCOHOL 1.4 % OP SOLN
1.0000 [drp] | OPHTHALMIC | Status: DC | PRN
  Filled 2017-12-26: qty 15

## 2017-12-26 MED ORDER — AMLODIPINE BESYLATE 5 MG PO TABS: 5.0000 mg | ORAL_TABLET | Freq: Every day | ORAL | 0 refills | Status: DC

## 2017-12-26 MED ORDER — QUETIAPINE FUMARATE 25 MG PO TABS: 25.0000 mg | ORAL_TABLET | Freq: Every day | ORAL | 0 refills | Status: DC

## 2017-12-26 MED ORDER — ALBUTEROL SULFATE HFA 108 (90 BASE) MCG/ACT IN AERS: 2.0000 | INHALATION_SPRAY | Freq: Four times a day (QID) | RESPIRATORY_TRACT | 0 refills | Status: DC | PRN

## 2017-12-26 MED ORDER — ALUM & MAG HYDROXIDE-SIMETH 200-200-20 MG/5ML PO SUSP
15.0000 mL | ORAL | Status: DC | PRN
  Administered 2017-12-26 – 2017-12-29 (×2): 15 mL via ORAL
  Filled 2017-12-26 (×3): qty 30

## 2017-12-26 MED ORDER — TRAZODONE HCL 100 MG PO TABS: 50.0000 mg | ORAL_TABLET | Freq: Every day | ORAL | 0 refills | Status: DC

## 2017-12-26 MED ORDER — ALUM & MAG HYDROXIDE-SIMETH 200-200-20 MG/5ML PO SUSP
15.0000 mL | Freq: Once | ORAL | Status: AC
  Administered 2017-12-26: 15 mL via ORAL
  Filled 2017-12-26: qty 30

## 2017-12-26 MED ORDER — POLYVINYL ALCOHOL 1.4 % OP SOLN: 1.0000 [drp] | OPHTHALMIC | 0 refills | Status: DC | PRN

## 2017-12-26 MED ORDER — MAGNESIUM OXIDE 400 (241.3 MG) MG PO TABS: 200.0000 mg | ORAL_TABLET | Freq: Two times a day (BID) | ORAL | 1 refills | Status: DC

## 2017-12-26 MED ORDER — OMEPRAZOLE 20 MG PO CPDR: 20.0000 mg | DELAYED_RELEASE_CAPSULE | Freq: Every day | ORAL | 0 refills | Status: DC

## 2017-12-26 MED ORDER — LEVOTHYROXINE SODIUM 100 MCG PO TABS: 100.0000 ug | ORAL_TABLET | Freq: Every day | ORAL | 0 refills | Status: DC

## 2017-12-26 MED ORDER — ACETAMINOPHEN 325 MG PO TABS
650.0000 mg | ORAL_TABLET | Freq: Once | ORAL | Status: AC
  Administered 2017-12-26: 650 mg via ORAL
  Filled 2017-12-26: qty 2

## 2017-12-26 MED ORDER — ACETAMINOPHEN 325 MG PO TABS
650.0000 mg | ORAL_TABLET | Freq: Four times a day (QID) | ORAL | Status: DC | PRN
  Administered 2017-12-27 – 2017-12-29 (×2): 650 mg via ORAL
  Filled 2017-12-26 (×2): qty 2

## 2017-12-26 MED ORDER — FLEET ENEMA 7-19 GM/118ML RE ENEM
1.0000 | ENEMA | Freq: Every day | RECTAL | Status: DC | PRN
  Administered 2017-12-26: 1 via RECTAL
  Filled 2017-12-26: qty 1

## 2017-12-26 MED ORDER — POLYETHYLENE GLYCOL 3350 17 G PO PACK
17.0000 g | PACK | Freq: Every day | ORAL | Status: DC
  Administered 2017-12-28 – 2017-12-30 (×3): 17 g via ORAL
  Filled 2017-12-26 (×3): qty 1

## 2017-12-26 MED ORDER — CYCLOSPORINE 0.05 % OP EMUL
1.0000 [drp] | Freq: Two times a day (BID) | OPHTHALMIC | Status: DC
  Administered 2017-12-26 – 2017-12-30 (×8): 1 [drp] via OPHTHALMIC
  Filled 2017-12-26 (×10): qty 1

## 2017-12-26 MED ORDER — CALCIUM CARBONATE ANTACID 500 MG PO CHEW
1.0000 | CHEWABLE_TABLET | Freq: Three times a day (TID) | ORAL | Status: DC
  Administered 2017-12-26 – 2017-12-29 (×8): 200 mg via ORAL
  Filled 2017-12-26 (×14): qty 1

## 2017-12-26 MED ORDER — LINACLOTIDE 290 MCG PO CAPS: 290.0000 ug | ORAL_CAPSULE | Freq: Every day | ORAL | 0 refills | Status: DC

## 2017-12-26 MED ORDER — VITAMIN B-6 50 MG PO TABS
50.0000 mg | ORAL_TABLET | Freq: Two times a day (BID) | ORAL | Status: DC
  Administered 2017-12-27 – 2017-12-30 (×7): 50 mg via ORAL
  Filled 2017-12-26 (×9): qty 1

## 2017-12-26 MED ORDER — VITAMIN D 25 MCG (1000 UNIT) PO TABS
1000.0000 [IU] | ORAL_TABLET | Freq: Every day | ORAL | Status: DC
  Administered 2017-12-27 – 2017-12-29 (×3): 1000 [IU] via ORAL
  Filled 2017-12-26 (×4): qty 1

## 2017-12-26 MED ORDER — PRAZOSIN HCL 2 MG PO CAPS: 2.0000 mg | ORAL_CAPSULE | Freq: Every day | ORAL | 1 refills | Status: DC

## 2017-12-26 NOTE — BH Assessment (Addendum)
Referral information for Psychiatric Hospitalization faxed to;   Marland Kitchen Cristal Ford [phone]),   . [place]   . [facility] [phone]---[phone]),  . Mikel Cella [phone], [phone], [phone] or [phone]),   . [place] [phone] or [phone])  . [facility] [phone]),   . [facility] [phone]),   . [address] [phone]),   . [facility] ([phone] or [phone]), Pending review.  . Hospital doctor  . [place] [phone] or [phone]),   . [facility] [phone]).

## 2017-12-26 NOTE — Consult Note (Signed)
[place] Psychiatry Consult   Reason for Consult: Follow-up for this 67 year old woman with a history of mood instability Referring Physician: Quale Patient Identification: Angela [name] MRN:  [id number] Principal Diagnosis: Bipolar I disorder, most recent episode mixed, severe with psychotic features ([place]) Diagnosis:  Principal Problem:   Bipolar I disorder, most recent episode mixed, severe with psychotic features ([place]) Active Problems:   Hypothyroidism   Essential hypertension   PTSD (post-traumatic stress disorder)   Personality disorder ([place])   Noncompliance   Total Time spent with patient: 30 minutes  Subjective:   Angela [name] is a 67 y.o. female patient admitted with "I am constipated".  HPI: Patient seen chart reviewed.  Patient continues to be very somatic and demanding all day long.  Jumping from topic to topic.  The main issue today was constipation.  We came to an agreement about a plan for managing that.  I have also tried to restart all of the medication she is usually on that the hospital carries and that seem at all useful.  Our plan had been to [facility] and if that failed to await some kind of disposition to another facility and if that did not work out to see if the patient's family would find a disposition.  [facility] but then heard from the patient's personal case manager that [facility].  There seems to now be some confusion as to whether she will go directly to [facility] or will go to an apartment tomorrow.  In any case the patient is at her baseline with chronic multiple somatic complaints mood lability argumentativeness but no sign of suicidal ideation or violence  Past Psychiatric History: Longstanding mood instability behavior problems difficulty taking care of herself.  Risk to Self: Suicidal Ideation:  No Suicidal Intent: No Is patient at risk for suicide?: No Suicidal Plan?: No Access to Means: No What has been your use of drugs/alcohol within the last 12 months?: Unknown Other Self Harm Risks: denied Triggers for Past Attempts: None known Intentional Self Injurious Behavior: None Risk to Others: Homicidal Ideation: No Thoughts of Harm to Others: No Current Homicidal Intent: No Current Homicidal Plan: No Access to Homicidal Means: No Identified Victim: None identified History of harm to others?: No Assessment of Violence: None Noted Does patient have access to weapons?: No Criminal Charges Pending?: No Does patient have a court date: No Prior Inpatient Therapy: Prior Inpatient Therapy: Yes Prior Therapy Dates: 2019 and prior Prior Therapy Facilty/Provider(s): [facility] Reason for Treatment: Bipolar Disorder Prior Outpatient Therapy: Prior Outpatient Therapy: Yes Prior Therapy Dates: Current Prior Therapy Facilty/Provider(s): [facility] Reason for Treatment: Bipolar Disorder Does patient have an ACCT team?: No Does patient have Intensive In-House Services?  : No Does patient have [facility] services? : No Does patient have P4CC services?: No  Past Medical History:  Past Medical History:  Diagnosis Date  . Allergic rhinitis   . Anemia   . Blurred vision   . Depression   . Diverticulosis   . Endometriosis   . Falls   . GERD (gastroesophageal reflux disease)   . Hernia, hiatal   . Hypothyroidism   . IBS (irritable bowel syndrome)   . Malignant neoplasm of skin   . Migraine   . Neuropathy   . PTSD (post-traumatic stress disorder)   . Thyroid disease     Past Surgical  History:  Procedure Laterality Date  . APPENDECTOMY    . CHOLECYSTECTOMY    . CHOLECYSTECTOMY, LAPAROSCOPIC    . ESOPHAGOGASTRODUODENOSCOPY (EGD) WITH PROPOFOL N/A 03/29/2015   Procedure: ESOPHAGOGASTRODUODENOSCOPY (EGD) WITH PROPOFOL;  Surgeon: Josefine Class, MD;  Location: [facility]  ENDOSCOPY;  Service: Endoscopy;  Laterality: N/A;  . HERNIA REPAIR    . laproscopy    . TONSILLECTOMY    . uteral suspension     Family History:  Family History  Problem Relation Age of Onset  . Heart disease Father   . Hearing loss Father   . COPD Father   . Depression Father   . Stroke Father   . Vision loss Father   . Varicose Veins Father   . Cancer Mother        lung  . Depression Sister   . Arthritis Sister   . Arthritis Brother   . Hernia Brother   . Anxiety disorder Brother   . Arthritis Brother   . Hernia Brother   . Anxiety disorder Brother   . Urolithiasis Neg Hx   . Kidney disease Neg Hx   . Kidney cancer Neg Hx   . Prostate cancer Neg Hx    Family Psychiatric  History: Unknown Social History:  Social History   Substance and Sexual Activity  Alcohol Use No     Social History   Substance and Sexual Activity  Drug Use No    Social History   Socioeconomic History  . Marital status: Single    Spouse name: Not on file  . Number of children: 0  . Years of education: 1.5 years of college  . Highest education level: Some college, no degree  Occupational History  . Occupation: Retired  Scientific laboratory technician  . Financial resource strain: Very hard  . Food insecurity:    Worry: Often true    Inability: Often true  . Transportation needs:    Medical: Yes    Non-medical: Yes  Tobacco Use  . Smoking status: Former Smoker    Last attempt to quit: 11/17/1976    Years since quitting: 41.1  . Smokeless tobacco: Never Used  Substance and Sexual Activity  . Alcohol use: No  . Drug use: No  . Sexual activity: Not Currently  Lifestyle  . Physical activity:    Days per week: 0 days    Minutes per session: 0 min  . Stress: Very much  Relationships  . Social connections:    Talks on phone: Never    Gets together: Never    Attends religious service: More than 4 times per year    Active member of club or organization: No    Attends meetings of clubs or  organizations: Never    Relationship status: Not on file  Other Topics Concern  . Not on file  Social History Narrative   Lives at home alone.   Right-handed.   No daily caffeine use.   Additional Social History:    Allergies:   Allergies  Allergen Reactions  . Bee Venom Hives  . Darvon [Propoxyphene] Anaphylaxis and Hives    Hives/throat swelling   . Dicyclomine Itching  . Other Hives    Spider bites cause hives  . Oxycodone-Acetaminophen Nausea And Vomiting and Other (See Comments)    hallucinations   . Peanuts [Peanut Oil] Anaphylaxis and Hives  . Penicillins Hives    Has patient had a PCN reaction causing immediate rash, facial/tongue/throat swelling, SOB or lightheadedness with hypotension: Unknown  Has patient had a PCN reaction causing severe rash involving mucus membranes or skin necrosis: Unknown Has patient had a PCN reaction that required hospitalization: Unknown Has patient had a PCN reaction occurring within the last 10 years: Unknown If all of the above answers are "NO", then may proceed with Cephalosporin use.  . Sulfa Antibiotics Anaphylaxis, Hives and Itching  . Sulfacetamide Sodium Anaphylaxis, Hives and Itching  . Ultram [Tramadol] Hives  . Amikacin Nausea And Vomiting  . Aspirin Hives  . Doxycycline Photosensitivity  . Haloperidol Nausea And Vomiting  . Hymenoptera Venom Preparations Hives    Reaction to spider bites  . Imipramine Hives    Reaction to Tofranil  . Keflex [Cephalexin] Hives  . Lactose Intolerance (Gi)   . Sulfur Hives  . Adhesive [Tape] Hives and Rash  . Albuterol Palpitations  . Ciprofloxacin Other (See Comments)    tendonopathy  . Hydroxyzine Anxiety and Itching  . Peanut Butter Flavor Anxiety  . Prednisone Nausea And Vomiting, Anxiety and Other (See Comments)    Crazy mood swings, strange thoughts (pt does tolerate cortisone injections)    Labs:  Results for orders placed or performed during the hospital encounter of  12/24/17 (from the past 48 hour(s))  Comprehensive metabolic panel     Status: None   Collection Time: 12/24/17  6:35 PM  Result Value Ref Range   Sodium 136 135 - 145 mmol/L   Potassium 4.1 3.5 - 5.1 mmol/L   Chloride 104 98 - 111 mmol/L   CO2 27 22 - 32 mmol/L   Glucose, Bld 87 70 - 99 mg/dL   BUN 11 8 - 23 mg/dL   Creatinine, Ser 0.78 0.44 - 1.00 mg/dL   Calcium 8.9 8.9 - 10.3 mg/dL   Total Protein 6.9 6.5 - 8.1 g/dL   Albumin 4.1 3.5 - 5.0 g/dL   AST 23 15 - 41 U/L   ALT 19 0 - 44 U/L   Alkaline Phosphatase 82 38 - 126 U/L   Total Bilirubin 0.4 0.3 - 1.2 mg/dL   GFR calc non Af Amer >60 >60 mL/min   GFR calc Af Amer >60 >60 mL/min   Anion gap 5 5 - 15    Comment: Performed at [facility], [address], [place], [place] [address]  Ethanol     Status: None   Collection Time: 12/24/17  6:35 PM  Result Value Ref Range   Alcohol, Ethyl (B) <10 <10 mg/dL    Comment: (NOTE) Lowest detectable limit for serum alcohol is 10 mg/dL. For medical purposes only. Performed at [facility], [address] [place], [place] [address]   Salicylate level     Status: None   Collection Time: 12/24/17  6:35 PM  Result Value Ref Range   Salicylate Lvl <2.0 2.8 - 30.0 mg/dL    Comment: Performed at [facility], [address] [place], [place] [address]  Acetaminophen level     Status: Abnormal   Collection Time: 12/24/17  6:35 PM  Result Value Ref Range   Acetaminophen (Tylenol), Serum <10 (L) 10 - 30 ug/mL    Comment: (NOTE) Therapeutic concentrations vary significantly. A range of 10-30 ug/mL  may be an effective concentration for many patients. However, some  are best treated at concentrations outside of this range. Acetaminophen concentrations >150 ug/mL at 4 hours after ingestion  and >50 ug/mL at 12 hours after ingestion are often associated with  toxic reactions. Performed at [facility],  [place], [place] [address]    cbc     Status: None   Collection Time: 12/24/17  6:35 PM  Result Value Ref Range   WBC 4.4 4.0 - 10.5 K/uL   RBC 4.09 3.87 - 5.11 MIL/uL   Hemoglobin 12.6 12.0 - 15.0 g/dL   HCT 38.3 36.0 - 46.0 %   MCV 93.6 80.0 - 100.0 fL   MCH 30.8 26.0 - 34.0 pg   MCHC 32.9 30.0 - 36.0 g/dL   RDW 14.3 11.5 - 15.5 %   Platelets 261 150 - 400 K/uL   nRBC 0.0 0.0 - 0.2 %    Comment: Performed at [facility], [address], [place], [place] [address]  Urine Drug Screen, Qualitative     Status: None   Collection Time: 12/24/17  6:37 PM  Result Value Ref Range   Tricyclic, Ur Screen NONE DETECTED NONE DETECTED   Amphetamines, Ur Screen NONE DETECTED NONE DETECTED   MDMA (Ecstasy)Ur Screen NONE DETECTED NONE DETECTED   Cocaine Metabolite,Ur [place] NONE DETECTED NONE DETECTED   Opiate, Ur Screen NONE DETECTED NONE DETECTED   Phencyclidine (PCP) Ur S NONE DETECTED NONE DETECTED   Cannabinoid 50 Ng, Ur  NONE DETECTED NONE DETECTED   Barbiturates, Ur Screen NONE DETECTED NONE DETECTED   Benzodiazepine, Ur Scrn NONE DETECTED NONE DETECTED   Methadone Scn, Ur NONE DETECTED NONE DETECTED    Comment: (NOTE) Tricyclics + metabolites, urine    Cutoff 1000 ng/mL Amphetamines + metabolites, urine  Cutoff 1000 ng/mL MDMA (Ecstasy), urine              Cutoff 500 ng/mL Cocaine Metabolite, urine          Cutoff 300 ng/mL Opiate + metabolites, urine        Cutoff 300 ng/mL Phencyclidine (PCP), urine         Cutoff 25 ng/mL Cannabinoid, urine                 Cutoff 50 ng/mL Barbiturates + metabolites, urine  Cutoff 200 ng/mL Benzodiazepine, urine              Cutoff 200 ng/mL Methadone, urine                   Cutoff 300 ng/mL The urine drug screen provides only a preliminary, unconfirmed analytical test result and should not be used for non-medical purposes. Clinical consideration and professional judgment should be applied to any positive drug screen result due to possible interfering substances.  A more specific alternate chemical method must be used in order to obtain a confirmed analytical result. Gas chromatography / mass spectrometry (GC/MS) is the preferred confirmat ory method. Performed at [facility], [address] [place], [place] [address]   Urinalysis, Complete w Microscopic     Status: Abnormal   Collection Time: 12/24/17  6:37 PM  Result Value Ref Range   Color, Urine STRAW (A) YELLOW   APPearance CLEAR (A) CLEAR   Specific Gravity, Urine 1.002 (L) 1.005 - 1.030   pH 7.0 5.0 - 8.0   Glucose, UA NEGATIVE NEGATIVE mg/dL   Hgb urine dipstick NEGATIVE NEGATIVE   Bilirubin Urine NEGATIVE NEGATIVE   Ketones, ur NEGATIVE NEGATIVE mg/dL   Protein, ur NEGATIVE NEGATIVE mg/dL   Nitrite NEGATIVE NEGATIVE   Leukocytes, UA NEGATIVE NEGATIVE   WBC, UA 0-5 0 - 5 WBC/hpf   Bacteria, UA NONE SEEN NONE SEEN   Squamous Epithelial /  LPF 0-5 0 - 5    Comment: Performed at [facility], [address] [place], [place] [address]    Current Facility-Administered Medications  Medication Dose Route Frequency Provider Last Rate Last Dose  . acetaminophen (TYLENOL) tablet 650 mg  650 mg Oral Q6H PRN ,  T, MD      . amLODipine (NORVASC) tablet 5 mg  5 mg Oral Daily , Madie Reno, MD   5 mg at 12/26/17 1138  . calcium carbonate (TUMS - dosed in mg elemental calcium) chewable tablet 200 mg of elemental calcium  1 tablet Oral TID ,  T, MD      . cholecalciferol (VITAMIN D3) tablet 1,000 Units  1,000 Units Oral Daily ,  T, MD      . cyanocobalamin ((VITAMIN B-12)) injection 1,000 mcg  1,000 mcg Intramuscular Once ,  T, MD      . cycloSPORINE (RESTASIS) 0.05 % ophthalmic emulsion 1 drop  1 drop Both Eyes BID ,  T, MD      . docusate sodium (COLACE) capsule 100 mg  100 mg Oral BID ,  T, MD      . feeding supplement (ENSURE ENLIVE) (ENSURE ENLIVE) liquid 237 mL  237 mL Oral BID BM ,  T, MD    237 mL at 12/26/17 1530  . levothyroxine (SYNTHROID, LEVOTHROID) tablet 100 mcg  100 mcg Oral Q0600 , Madie Reno, MD   100 mcg at 12/26/17 0526  . [START ON 12/27/2017] linaclotide (LINZESS) capsule 145 mcg  145 mcg Oral QAC breakfast ,  T, MD      . PARoxetine (PAXIL-CR) 24 hr tablet 25 mg  25 mg Oral Daily , Madie Reno, MD   25 mg at 12/26/17 1137  . polyethylene glycol (MIRALAX / GLYCOLAX) packet 17 g  17 g Oral Daily ,  T, MD      . polyvinyl alcohol (LIQUIFILM TEARS) 1.4 % ophthalmic solution 1 drop  1 drop Both Eyes PRN ,  T, MD      . prazosin (MINIPRESS) capsule 2 mg  2 mg Oral QHS Paulette Blanch, MD   2 mg at 12/25/17 2103  . pyridOXINE (VITAMIN B-6) tablet 50 mg  50 mg Oral BID ,  T, MD      . QUEtiapine (SEROQUEL) tablet 25 mg  25 mg Oral QHS Paulette Blanch, MD      . sodium phosphate (FLEET) 7-19 GM/118ML enema 1 enema  1 enema Rectal Daily PRN , Madie Reno, MD   1 enema at 12/26/17 1528  . traZODone (DESYREL) tablet 50 mg  50 mg Oral QHS Paulette Blanch, MD   50 mg at 12/25/17 2103   Current Outpatient Medications  Medication Sig Dispense Refill  . albuterol (PROVENTIL HFA;VENTOLIN HFA) 108 (90 Base) MCG/ACT inhaler Inhale 2 puffs into the lungs every 6 (six) hours as needed for wheezing or shortness of breath. 1 Inhaler 0  . [START ON 12/27/2017] amLODipine (NORVASC) 5 MG tablet Take 1 tablet (5 mg total) by mouth daily. 30 tablet 0  . calcium carbonate (TUMS - DOSED IN MG ELEMENTAL CALCIUM) 500 MG chewable tablet Chew 1 tablet (200 mg of elemental calcium total) by mouth 3 (three) times daily. 90 tablet 0  . Cholecalciferol 25 MCG (1000 UT) tablet Take 1 tablet (1,000 Units total) by mouth daily. 30 tablet 1  . cycloSPORINE (RESTASIS) 0.05 % ophthalmic emulsion Place 1 drop into both eyes 2 (two) times daily. 0.4  mL 1  . docusate sodium (COLACE) 100 MG capsule Take 1 capsule (100 mg total) by mouth 2 (two) times daily. 60 capsule 0  .  Icosapent Ethyl (VASCEPA) 1 g CAPS Two capsules by mouth twice a day (to help with triglycerides) 120 capsule 0  . [START ON 12/27/2017] levothyroxine (SYNTHROID, LEVOTHROID) 100 MCG tablet Take 1 tablet (100 mcg total) by mouth daily at 6 (six) AM. 30 tablet 0  . lidocaine (LIDODERM) 5 % Place 1 patch onto the skin daily. Remove & Discard patch within 12 hours or as directed by MD 30 patch 0  . linaclotide (LINZESS) 290 MCG CAPS capsule Take 1 capsule (290 mcg total) by mouth daily before breakfast. 30 capsule 0  . magnesium oxide (MAG-OX) 400 (241.3 Mg) MG tablet Take 0.5 tablets (200 mg total) by mouth 2 (two) times daily. 60 tablet 1  . Multiple Vitamin (MULTIVITAMIN WITH MINERALS) TABS tablet Take 1 tablet by mouth daily. 30 tablet 1  . omeprazole (PRILOSEC) 20 MG capsule Take 1 capsule (20 mg total) by mouth daily. 30 capsule 0  . [START ON 12/27/2017] PARoxetine (PAXIL-CR) 25 MG 24 hr tablet Take 1 tablet (25 mg total) by mouth daily. 30 tablet 0  . polyethylene glycol (MIRALAX / GLYCOLAX) packet Take 17 g by mouth daily. 30 each 0  . polyvinyl alcohol (LIQUIFILM TEARS) 1.4 % ophthalmic solution Place 1 drop into both eyes as needed for dry eyes. 15 mL 0  . prazosin (MINIPRESS) 2 MG capsule Take 1 capsule (2 mg total) by mouth at bedtime. 30 capsule 1  . pyridoxine (B-6) 100 MG tablet Take 1 tablet (100 mg total) by mouth 2 (two) times daily. 60 tablet 0  . QUEtiapine (SEROQUEL) 25 MG tablet Take 1 tablet (25 mg total) by mouth at bedtime. 30 tablet 0  . traZODone (DESYREL) 100 MG tablet Take 0.5 tablets (50 mg total) by mouth at bedtime. 30 tablet 0    Musculoskeletal: Strength & Muscle Tone: within normal limits Gait & Station: normal Patient leans: N/A  Psychiatric Specialty Exam: Physical Exam  Nursing note and vitals reviewed. Constitutional: She appears well-developed and well-nourished.  HENT:  Head: Normocephalic and atraumatic.  Eyes: Pupils are equal, round, and reactive  to light. Conjunctivae are normal.  Neck: Normal range of motion.  Cardiovascular: Regular rhythm and normal heart sounds.  Respiratory: Effort normal. No respiratory distress.  GI: Soft.  Musculoskeletal: Normal range of motion.  Neurological: She is alert.  Skin: Skin is warm and dry.  Psychiatric: Her mood appears anxious. Her affect is labile. Her speech is tangential. She is agitated. She is not aggressive. Thought content is not paranoid. Cognition and memory are impaired. She expresses impulsivity and inappropriate judgment. She expresses no homicidal and no suicidal ideation.    Review of Systems  Constitutional: Negative.   HENT: Negative.   Eyes: Negative.   Respiratory: Negative.   Cardiovascular: Negative.   Gastrointestinal: Positive for constipation.  Musculoskeletal: Negative.   Skin: Negative.   Neurological: Negative.   Psychiatric/Behavioral: Positive for memory loss. Negative for depression, hallucinations, substance abuse and suicidal ideas. The patient is nervous/anxious and has insomnia.     Blood pressure 121/65, pulse 60, temperature 98.1 F (36.7 C), temperature source Oral, resp. rate 18, height 5\' 3"  (1.6 m), weight 63.5 kg, SpO2 100 %.Body mass index is 24.8 kg/m.  General Appearance: Disheveled  Eye Contact:  Fair  Speech:  Pressured  Volume:  Increased  Mood:  Anxious,  Dysphoric and Irritable  Affect:  Inappropriate and Labile  Thought Process:  Disorganized  Orientation:  Full (Time, Place, and Person)  Thought Content:  Illogical and Paranoid Ideation  Suicidal Thoughts:  No  Homicidal Thoughts:  No  Memory:  Immediate;   Fair Recent;   Poor Remote;   Fair  Judgement:  Impaired  Insight:  Shallow  Psychomotor Activity:  Restlessness  Concentration:  Concentration: Fair  Recall:  [name]:  Fair  Language:  Fair  Akathisia:  No  Handed:  Right  AIMS (if indicated):     Assets:  Desire for Improvement Financial  Resources/Insurance Resilience  ADL's:  Impaired  Cognition:  Impaired,  Mild  Sleep:        Treatment Plan Summary: Daily contact with patient to assess and evaluate symptoms and progress in treatment, Medication management and Plan It is starting to look like we have some kind of disposition for her.  I have spoken with the ER physician and the patient is being discharged.  I have printed out prescriptions for all of the medicines that seem appropriate.  Patient and her case manager are still on the phone and were working out whether she will leave tonight or tomorrow.  Disposition: Supportive therapy provided about ongoing stressors.  Alethia Berthold, MD 12/26/2017 5:26 PM

## 2017-12-26 NOTE — ED Notes (Signed)
Dr. Weber Cooks is in talking with Patient at this time.

## 2017-12-26 NOTE — ED Notes (Signed)
Patient is still complaining of constipation, she has had mag citrate, enema and colace, she states that she did go some but that he stomach still feels distended, Nurse told her to wait and see if mag Citrate will work soon, Patient is calm and cooperative, also states she needs help for her memory , wants to go for memory rehab she states. Nurse will continue to monitor, q 15 minute checks and camera surveillance in progress for safety.

## 2017-12-26 NOTE — ED Provider Notes (Signed)
Vitals:   12/24/17 1711 12/25/17 1142  BP: (!) 163/86 (!) 144/71  Pulse: 64 (!) 58  Resp: 20 18  Temp: 97.9 F (36.6 C) 98.1 F (36.7 C)  SpO2: 99% 100%   Patient remains medically clear for psychiatric disposition.   Earleen Newport, MD 12/26/17 [phone]

## 2017-12-26 NOTE — ED Notes (Signed)
Pt said " Mylanta worked but no quite. Can you please ask the doctor to give me the rest of the amount?' Notified EPD

## 2017-12-26 NOTE — ED Notes (Signed)
Hourly rounding reveals patient sleeping in room. No complaints, stable, in no acute distress. Q15 minute rounds and monitoring via Security Cameras to continue. 

## 2017-12-26 NOTE — ED Notes (Signed)
Pt called her friend Flo and told her to bring her warm cloth and drop it at ED front desk for tomorrow since she is going to be discharged and she doesn't have any warm cloth to wear. This will be relayed to incoming nurse.

## 2017-12-26 NOTE — ED Notes (Signed)
Pt c/o having gas in her stomach and asking to have Mylanta. EPD notified.

## 2017-12-26 NOTE — ED Notes (Signed)
Pt requested laxatives, and something for possible bronchitis. MG informed pt I will let the RN Abigail Butts aware of her requests.

## 2017-12-26 NOTE — ED Notes (Signed)
Care Manager called back and states that he is trying to get her a apartment and is also attempting to get [facility] to [facility] for [facility]hab [facility],  and states that He now will pick her up in the AM, He talked with Dr. Weber Cooks again and Dr.Clapacs had already printed out prescriptions, and discharge information, Discharge is on hold until further notice.

## 2017-12-26 NOTE — ED Notes (Signed)
Report to include Situation, Background, Assessment, and Recommendations received from wendy RN. Patient alert and oriented, warm and dry, in no acute distress. Patient denies SI, HI, AVH and pain. Patient made aware of Q15 minute rounds and Engineer, drilling presence for their safety. Patient instructed to come to me with needs or concerns.

## 2017-12-26 NOTE — ED Notes (Addendum)
The care manager Royston Bake [phone],  called and states that He would pick the Patient up tonight and talked to Dr. Weber Cooks regarding discharge, care manager knew that Patient had bed at [facility] and ask if He could just pick her up vs going to facility in [place]. Dr. Weber Cooks agreed.

## 2017-12-26 NOTE — ED Notes (Signed)
Hourly rounding reveals patient in room. No complaints, stable, in no acute distress. Q15 minute rounds and monitoring via Security Cameras to continue. 

## 2017-12-26 NOTE — ED Provider Notes (Signed)
Dr. Weber Cooks unmodified me that the patient's disposition is changed.  Patient will be going to [facility], she has a Transport planner who is going to come pick her up and [facility] were they have secured a bed.  Patient will be discharged from the ER, diagnosis of bipolar disorder.  Dr. Weber Cooks will be providing prescriptions for her to continue on.   Delman Kitten, MD 12/26/17 [phone]

## 2017-12-26 NOTE — Discharge Instructions (Signed)
You have been seen in the Emergency Department (ED) today for a psychiatric complaint.  You have been evaluated by psychiatry and we believe you are safe to be discharged from the hospital to [facility].    Please return to the ED immediately if you have ANY thoughts of hurting yourself or anyone else, so that we may help you.  Please avoid alcohol and drug use.  Follow up with your doctor and/or therapist as soon as possible regarding today's ED visit.   Please follow up any other recommendations and clinic appointments provided by the psychiatry team that saw you in the Emergency Department.

## 2017-12-26 NOTE — ED Notes (Signed)
Patient is up to the bathroom, no signs of distress at this time, will continue to monitor, q 15 minute checks and camera surveillance in progress for safety.

## 2017-12-26 NOTE — ED Notes (Signed)
Nurse obtained v/s, she states I will take my medication now, but I want to sleep, she is cooperative, at this time, will continue to monitor.

## 2017-12-27 DIAGNOSIS — F319 Bipolar disorder, unspecified: Secondary | ICD-10-CM | POA: Diagnosis not present

## 2017-12-27 MED ORDER — ALUM & MAG HYDROXIDE-SIMETH 200-200-20 MG/5ML PO SUSP
15.0000 mL | Freq: Once | ORAL | Status: AC
  Administered 2017-12-27: 15 mL via ORAL

## 2017-12-27 NOTE — ED Notes (Signed)
Hourly rounding reveals patient in room. No complaints, stable, in no acute distress. Q15 minute rounds and monitoring via Security Cameras to continue. 

## 2017-12-27 NOTE — NC FL2 (Signed)
[place] LEVEL OF CARE SCREENING TOOL     IDENTIFICATION  Patient Name: Angela [name] Birthdate: 03/29/1950 Sex: female Admission Date (Current Location): 12/24/2017  [place] and [place] Number:  Engineering geologist and Address:  [facility], [address] [place], [place] [address]      Provider Number: [id number]  Attending Physician Name and Address:  No att. providers found  Relative Name and Phone Number:   Su Grand [phone]     Current Level of Care: Hospital Recommended Level of Care: [place] Prior Approval Number:    Date Approved/Denied:   PASRR Number:    Discharge Plan: SNF    Current Diagnoses: Patient Active Problem List   Diagnosis Date Noted  . Personality disorder ([place]) 12/25/2017  . Noncompliance 12/25/2017  . Medication monitoring encounter 12/07/2017  . Family history of high cholesterol 12/07/2017  . Arthritis of hand, degenerative 12/07/2017  . Bipolar I disorder, most recent episode mixed, severe with psychotic features ([place]) 09/18/2017  . Agitation 09/18/2017  . IBS (irritable bowel syndrome) 11/17/2016  . Bilateral carpal tunnel syndrome 11/17/2016  . Plantar fasciitis, bilateral 11/17/2016  . Chronic pain syndrome 11/17/2016  . Chronic hyponatremia 11/17/2016  . PTSD (post-traumatic stress disorder) 11/17/2016  . Hernia, hiatal 11/17/2016  . Vitamin D deficiency 11/17/2016  . Vitamin B12 deficiency 11/17/2016  . SIADH (syndrome of inappropriate ADH production) ([place]) 09/08/2016  . Cervical spondylosis with myelopathy 05/24/2016  . Post-concussion headache 05/24/2016  . MCI (mild cognitive impairment) with memory loss 03/30/2016  . Gait abnormality 03/30/2016  . Chronic bipolar affective disorder ([place]) 01/24/2016  . Anemia, iron deficiency 06/24/2015  . Endometriosis 06/24/2015  . Essential hypertension 06/24/2015  . History of postmenopausal bleeding 05/22/2015   . History of TIA (transient ischemic attack) 04/20/2015  . Hypothyroidism 04/20/2015  . GERD (gastroesophageal reflux disease) 04/20/2015  . Cervical disc disorder with radiculopathy 03/25/2015  . Chronic neck pain 03/25/2015  . Pelvic pain in female 10/30/2014  . History of skin cancer 06/30/2014  . Chronic headaches 12/16/2013  . Neuropathy 12/16/2013    Orientation RESPIRATION BLADDER Height & Weight     Self, Time, Situation(Very forgetful needs constant prompting)  Normal Continent Weight: 140 lb (63.5 kg) Height:  5\' 3"  (160 cm)  BEHAVIORAL SYMPTOMS/MOOD NEUROLOGICAL BOWEL NUTRITION STATUS      Continent    AMBULATORY STATUS COMMUNICATION OF NEEDS Skin   Independent Verbally Normal                       Personal Care Assistance Level of Assistance  Bathing, Feeding, Dressing Bathing Assistance: Independent Feeding assistance: Independent Dressing Assistance: Independent     Functional Limitations Info  Sight, Hearing, Speech Sight Info: Adequate Hearing Info: Impaired Speech Info: Adequate    SPECIAL CARE FACTORS FREQUENCY  OT (By licensed OT), PT (By licensed PT)     PT Frequency: x5 OT Frequency: x5            Contractures Contractures Info: Not present    Additional Factors Info  Code Status, Allergies Code Status Info: Full Allergies Info:  Bee Venom, Darvon Propoxyphene, Dicyclomine, Other, Oxycodone-acetaminophen, Peanuts Peanut Oil, Penicillins, Sulfa Antibiotics, Sulfacetamide Sodium, Ultram Tramadol, Amikacin, Aspirin, Doxycycline, Haloperidol, Hymenoptera Venom Preparations, Imipramine, Keflex Cephalexin, Lactose Intolerance (Gi), Sulfur, Adhesive Tape, Albuterol, Ciprofloxacin, Hydroxyzine, Peanut Butter Flavor, Prednisone           Current Medications (12/27/2017):  This is the current  hospital active medication list Current Facility-Administered Medications  Medication Dose Route Frequency Provider Last Rate Last Dose  .  acetaminophen (TYLENOL) tablet 650 mg  650 mg Oral Q6H PRN Clapacs, Madie Reno, MD   650 mg at 12/27/17 0111  . alum & mag hydroxide-simeth (MAALOX/MYLANTA) 200-200-20 MG/5ML suspension 15 mL  15 mL Oral Q4H PRN Delman Kitten, MD   15 mL at 12/26/17 2311  . amLODipine (NORVASC) tablet 5 mg  5 mg Oral Daily Clapacs, Madie Reno, MD   5 mg at 12/26/17 1138  . calcium carbonate (TUMS - dosed in mg elemental calcium) chewable tablet 200 mg of elemental calcium  1 tablet Oral TID Clapacs, John T, MD   200 mg of elemental calcium at 12/26/17 2132  . cholecalciferol (VITAMIN D3) tablet 1,000 Units  1,000 Units Oral Daily Clapacs, John T, MD      . cycloSPORINE (RESTASIS) 0.05 % ophthalmic emulsion 1 drop  1 drop Both Eyes BID Clapacs, Madie Reno, MD   Stopped at 12/26/17 2136  . docusate sodium (COLACE) capsule 100 mg  100 mg Oral BID Clapacs, Madie Reno, MD   100 mg at 12/26/17 2142  . feeding supplement (ENSURE ENLIVE) (ENSURE ENLIVE) liquid 237 mL  237 mL Oral BID BM Clapacs, John T, MD   237 mL at 12/26/17 1530  . levothyroxine (SYNTHROID, LEVOTHROID) tablet 100 mcg  100 mcg Oral Q0600 Clapacs, Madie Reno, MD   100 mcg at 12/26/17 0526  . linaclotide (LINZESS) capsule 145 mcg  145 mcg Oral QAC breakfast Clapacs, John T, MD      . PARoxetine (PAXIL-CR) 24 hr tablet 25 mg  25 mg Oral Daily Clapacs, Madie Reno, MD   25 mg at 12/26/17 1137  . polyethylene glycol (MIRALAX / GLYCOLAX) packet 17 g  17 g Oral Daily Clapacs, John T, MD      . polyvinyl alcohol (LIQUIFILM TEARS) 1.4 % ophthalmic solution 1 drop  1 drop Both Eyes PRN Clapacs, John T, MD      . prazosin (MINIPRESS) capsule 2 mg  2 mg Oral QHS Paulette Blanch, MD   2 mg at 12/27/17 0011  . pyridOXINE (VITAMIN B-6) tablet 50 mg  50 mg Oral BID Clapacs, John T, MD      . QUEtiapine (SEROQUEL) tablet 25 mg  25 mg Oral QHS Paulette Blanch, MD      . sodium phosphate (FLEET) 7-19 GM/118ML enema 1 enema  1 enema Rectal Daily PRN Clapacs, Madie Reno, MD   1 enema at 12/26/17 1528  .  traZODone (DESYREL) tablet 50 mg  50 mg Oral QHS Paulette Blanch, MD   50 mg at 12/27/17 4403   Current Outpatient Medications  Medication Sig Dispense Refill  . albuterol (PROVENTIL HFA;VENTOLIN HFA) 108 (90 Base) MCG/ACT inhaler Inhale 2 puffs into the lungs every 6 (six) hours as needed for wheezing or shortness of breath. 1 Inhaler 0  . amLODipine (NORVASC) 5 MG tablet Take 1 tablet (5 mg total) by mouth daily. 30 tablet 0  . calcium carbonate (TUMS - DOSED IN MG ELEMENTAL CALCIUM) 500 MG chewable tablet Chew 1 tablet (200 mg of elemental calcium total) by mouth 3 (three) times daily. 90 tablet 0  . Cholecalciferol 25 MCG (1000 UT) tablet Take 1 tablet (1,000 Units total) by mouth daily. 30 tablet 1  . cycloSPORINE (RESTASIS) 0.05 % ophthalmic emulsion Place 1 drop into both eyes 2 (two) times daily. 0.4 mL 1  .  docusate sodium (COLACE) 100 MG capsule Take 1 capsule (100 mg total) by mouth 2 (two) times daily. 60 capsule 0  . Icosapent Ethyl (VASCEPA) 1 g CAPS Two capsules by mouth twice a day (to help with triglycerides) 120 capsule 0  . levothyroxine (SYNTHROID, LEVOTHROID) 100 MCG tablet Take 1 tablet (100 mcg total) by mouth daily at 6 (six) AM. 30 tablet 0  . lidocaine (LIDODERM) 5 % Place 1 patch onto the skin daily. Remove & Discard patch within 12 hours or as directed by MD 30 patch 0  . linaclotide (LINZESS) 290 MCG CAPS capsule Take 1 capsule (290 mcg total) by mouth daily before breakfast. 30 capsule 0  . magnesium oxide (MAG-OX) 400 (241.3 Mg) MG tablet Take 0.5 tablets (200 mg total) by mouth 2 (two) times daily. 60 tablet 1  . Multiple Vitamin (MULTIVITAMIN WITH MINERALS) TABS tablet Take 1 tablet by mouth daily. 30 tablet 1  . omeprazole (PRILOSEC) 20 MG capsule Take 1 capsule (20 mg total) by mouth daily. 30 capsule 0  . PARoxetine (PAXIL-CR) 25 MG 24 hr tablet Take 1 tablet (25 mg total) by mouth daily. 30 tablet 0  . polyethylene glycol (MIRALAX / GLYCOLAX) packet Take 17 g by  mouth daily. 30 each 0  . polyvinyl alcohol (LIQUIFILM TEARS) 1.4 % ophthalmic solution Place 1 drop into both eyes as needed for dry eyes. 15 mL 0  . prazosin (MINIPRESS) 2 MG capsule Take 1 capsule (2 mg total) by mouth at bedtime. 30 capsule 1  . pyridoxine (B-6) 100 MG tablet Take 1 tablet (100 mg total) by mouth 2 (two) times daily. 60 tablet 0  . QUEtiapine (SEROQUEL) 25 MG tablet Take 1 tablet (25 mg total) by mouth at bedtime. 30 tablet 0  . traZODone (DESYREL) 100 MG tablet Take 0.5 tablets (50 mg total) by mouth at bedtime. 30 tablet 0     Discharge Medications: Please see discharge summary for a list of discharge medications.  Relevant Imaging Results:  Relevant Lab Results:   Additional Information   Pauletta Browns, [place], [place]

## 2017-12-27 NOTE — ED Notes (Signed)
Report to include Situation, Background, Assessment, and Recommendations received from Rhea RN. Patient alert and oriented, warm and dry, in no acute distress. Patient denies SI, HI, AVH and pain. Patient made aware of Q15 minute rounds and security cameras for their safety. Patient instructed to come to me with needs or concerns. 

## 2017-12-27 NOTE — Progress Notes (Signed)
Pt care manager, Rogelia Boga, notified that patient will be unable to be accepted at [facility] today due to waiting on insurance authorization.  Pt care manager, Rogelia Boga, notified of situation. States that he will not be able to pick her up tonight because he has no where for her to go.  Pt then visited pt on the unit.  Care manager stated that he would be able to pick her up and take her to [facility] once authorization has been received.

## 2017-12-27 NOTE — ED Notes (Signed)
Dr. Weber Cooks stated that it was o.k. to give pt her morning medication.

## 2017-12-27 NOTE — ED Notes (Signed)
Pt was found by Gershon Mussel, security in the restroom with another pt. She was aggravating the other pt and asked to leave the restroom and reminded that only one client is allowed in the facilities at a time. This pt became defiant and aggressive demanding that she would not return back to her room when asked. I assisted security in asking the pt to please calmly return back to her room. Pt continued to refuse until she decided to return to her room on her own accord. RN Dominica Severin was made aware of the situation

## 2017-12-27 NOTE — ED Notes (Signed)
VOL  PENDING  PLACEMENT 

## 2017-12-27 NOTE — Progress Notes (Signed)
Pt offered TUMS and pt stated, "Thank you, but I may take it in 30 min."

## 2017-12-27 NOTE — ED Notes (Signed)
Pt c/o chronic neck and back pain 8/10. Administered 650 mg of Tylenol PO PRN per order. Patient tolerated well. No issues

## 2017-12-27 NOTE — ED Notes (Signed)
Pt rude with staff and upset that she didn't receive her medication until now.  Pt has been asleep all day.  Pt made aware that rounds have been done on her every hour by the RN and every 15 min by the nurse.

## 2017-12-27 NOTE — Progress Notes (Signed)
LCSW received call from TTS to contact patient Trust case manager: Gaylord Shih. He explained he has been working with Ms Whittier and her Trustee for housing stabilization and that she is to go to [facility] for respite-care until her new apartment is arranged in January 1st-Guardian is writing check today.  Spoke to [employer] [phone] Trust case manager  Spoke to [place] [facility] and she is [place] bed number is room 604, call report  [phone]. They are awaiting authorization  Hollyn Stucky LCSW [phone]

## 2017-12-27 NOTE — ED Notes (Signed)
The care manager called and stated that he did not want to come and pick up the patient until a discharge plan was in place.  Transferred to TTS for collateral information.

## 2017-12-27 NOTE — ED Provider Notes (Signed)
-----------------------------------------   6:19 AM on 12/27/2017 -----------------------------------------   Blood pressure 131/78, pulse 73, temperature 98.2 F (36.8 C), temperature source Oral, resp. rate 18, height 5\' 3"  (1.6 m), weight 63.5 kg, SpO2 100 %.  The patient had no acute events since last update.  Calm and cooperative at this time.  Looks like she will be going to [facility] today.    Paulette Blanch, MD 12/27/17 [phone]

## 2017-12-27 NOTE — ED Notes (Signed)
Pt sitting in day room socializing with other pt's , pt was given a snack tray and a beverage. Pt in no acute distress, nothing needed from staff at this time

## 2017-12-27 NOTE — ED Notes (Signed)
Pt informed that she has been discharged and needs to call her care manager to see if he is available to come pick her up.

## 2017-12-27 NOTE — Progress Notes (Signed)
Pt's care manager, Gershon Mussel, came to visit.  Stated that the pt told him that she requests to be woke up in the morning to receive her morning medicine.  Pt voicing multiple somatic complaints.  Pt states, "I am having aggression issues because I don't have any salt."  Pt requesting gatorade and salt packets.  MD made aware.  MD stated that patient's aren't allowed to have gatorade on this unit.

## 2017-12-28 DIAGNOSIS — F319 Bipolar disorder, unspecified: Secondary | ICD-10-CM | POA: Diagnosis not present

## 2017-12-28 MED ORDER — LORAZEPAM 2 MG PO TABS
2.0000 mg | ORAL_TABLET | ORAL | Status: DC | PRN
  Administered 2017-12-28 – 2017-12-29 (×2): 2 mg via ORAL
  Filled 2017-12-28 (×4): qty 1

## 2017-12-28 MED ORDER — LORAZEPAM 0.5 MG PO TABS
0.5000 mg | ORAL_TABLET | Freq: Once | ORAL | Status: AC
  Administered 2017-12-28: 0.5 mg via ORAL
  Filled 2017-12-28: qty 1

## 2017-12-28 NOTE — ED Notes (Signed)
Hourly rounding reveals patient sleeping in room. No complaints, stable, in no acute distress. Q15 minute rounds and monitoring via Security Cameras to continue. 

## 2017-12-28 NOTE — ED Notes (Signed)
Pt stated that she didn't want a snack tray at this time.

## 2017-12-28 NOTE — Progress Notes (Signed)
LCSW called patients guardian-Trust assistant Gaylord Shih [phone] and called [facility] and asked what the delay is.... Otila Kluver reported she is awaiting authorization number  [employer] LCSW [phone]

## 2017-12-28 NOTE — ED Provider Notes (Signed)
-----------------------------------------   9:22 AM on 12/28/2017 -----------------------------------------   Blood pressure 137/82, pulse 64, temperature 97.6 F (36.4 C), temperature source Oral, resp. rate 18, height 5\' 3"  (1.6 m), weight 63.5 kg, SpO2 100 %.  The patient had no acute events since last update.  Calm and cooperative at this time.  Disposition is pending Psychiatry/Behavioral Medicine team recommendations.    Arta Silence, MD 12/28/17 [phone]

## 2017-12-28 NOTE — ED Notes (Signed)
Hourly rounding reveals patient in room. No complaints, stable, in no acute distress. Q15 minute rounds and monitoring via Security Cameras to continue. 

## 2017-12-28 NOTE — ED Notes (Signed)
Pt compliant with VS and agreed to take half of her medications before breakfast. RN will administer the remaining meds after she eats.   Maintained on 15 minute checks and observation by security camera for safety.

## 2017-12-28 NOTE — Progress Notes (Signed)
In consultation with Dr Weber Cooks, he will re-IVC patient and have TTS find a [place] facility. LCSW met with patient who has several housing ideas, this worker had to re-direct due to time constraints.  Dayna Geurts LCSW

## 2017-12-28 NOTE — ED Notes (Signed)
Pt speaking with LCSW.  Lunch tray provided.   Maintained on 15 minute checks and observation by security camera for safety.

## 2017-12-28 NOTE — ED Notes (Signed)
Patient up asking for anxiety/sleeping medication.

## 2017-12-28 NOTE — Progress Notes (Signed)
NOTES: IMPORTANT- [facility] denied patient her trust assistant is now looking for another place.  Leianna Barga LCSW

## 2017-12-28 NOTE — ED Notes (Signed)
Pt. Walking around dayroom area talking with other patients.

## 2017-12-28 NOTE — Progress Notes (Signed)
LCSW received a call from [employer] Programmer, applications) that reported he has done everything to assist this patient with finding housing and he is now RESIGNING from this patient and will no longer assist her  In [place]. He reported to this worker that she was threatening him, raised her voice and refused to cooperate. He set new boundaries and the patient apparently did not respect them. This patient was standing in the [place] and she started yelling at me through the glass pane stating I was lying to her housing rep. LCSW met with patient and set very firm boundaries and patient was able to explain her current situation of homelessness. LCSW explained she does not meet criteria for STR at SNF. Pt   concurs she was mislead by this housing rep. She reports she did yell and threaten him spiritually and verbally. She reported because he mentioned my name and assumed I lied to him about her.  He called while I was present with patient and he explained he is resigning and will have no further contact with patient. Patient was glad. LCSW clarified my role and the patients options.  LCSW explained to patient she does not meet the 3 night qualifying stay to SNF nor does she want to hand over her SSI because she has fought so hard to be a free agent and prove that she is competent.  Patient has little options of housing at this point. Her trustee has written a check for [place] however she cant move in until January and the details of this arrangement are not clear.  LCSW requesting to consult Dr Weber Cooks.   [employer] LCSW [phone]

## 2017-12-28 NOTE — Consult Note (Signed)
[facility] Consult   Reason for Consult: Follow-up consult for this 67 year old woman with mood and behavior problems Referring Physician: Clearnce Hasten Patient Identification: Angela [name] MRN:  [id number] Principal Diagnosis: Bipolar I disorder, most recent episode mixed, severe with psychotic features ([place]) Diagnosis:  Principal Problem:   Bipolar I disorder, most recent episode mixed, severe with psychotic features ([place]) Active Problems:   Hypothyroidism   Essential hypertension   PTSD (post-traumatic stress disorder)   Personality disorder ([place])   Noncompliance   Total Time spent with patient: 20 minutes  Subjective:   Angela [name] is a 67 y.o. female patient admitted with "what is the plan?".  HPI: Patient seen chart reviewed.  Patient familiar from many prior encounters.  This 67 year old woman with chronic mood medical and behavior problems was scheduled to be transferred to another psychiatric facility 2 days ago when a person employed by her trust fund claimed to have made plans to admit her to a rehab facility.  Ultimately that all came to nothing.  The patient then apparently got on the phone and insulted that person so badly that they have quit and refused to have anything to do with her.  Now we have missed out on transfer and are back to where we started except the patient is even more angry and agitated and demanding than before.  Past Psychiatric History: Long history of behavior problems mood instability.  Poor insight.  Risk to Self: Suicidal Ideation: No Suicidal Intent: No Is patient at risk for suicide?: No Suicidal Plan?: No Access to Means: No What has been your use of drugs/alcohol within the last 12 months?: Unknown Other Self Harm Risks: denied Triggers for Past Attempts: None known Intentional Self Injurious Behavior: None Risk to Others: Homicidal Ideation: No Thoughts of Harm to Others: No Current Homicidal Intent:  No Current Homicidal Plan: No Access to Homicidal Means: No Identified Victim: None identified History of harm to others?: No Assessment of Violence: None Noted Does patient have access to weapons?: No Criminal Charges Pending?: No Does patient have a court date: No Prior Inpatient Therapy: Prior Inpatient Therapy: Yes Prior Therapy Dates: 2019 and prior Prior Therapy Facilty/Provider(s): [facility] Reason for Treatment: Bipolar Disorder Prior Outpatient Therapy: Prior Outpatient Therapy: Yes Prior Therapy Dates: Current Prior Therapy Facilty/Provider(s): [facility] Reason for Treatment: Bipolar Disorder Does patient have an ACCT team?: No Does patient have Intensive In-House Services?  : No Does patient have [facility] services? : No Does patient have P4CC services?: No  Past Medical History:  Past Medical History:  Diagnosis Date  . Allergic rhinitis   . Anemia   . Blurred vision   . Depression   . Diverticulosis   . Endometriosis   . Falls   . GERD (gastroesophageal reflux disease)   . Hernia, hiatal   . Hypothyroidism   . IBS (irritable bowel syndrome)   . Malignant neoplasm of skin   . Migraine   . Neuropathy   . PTSD (post-traumatic stress disorder)   . Thyroid disease     Past Surgical History:  Procedure Laterality Date  . APPENDECTOMY    . CHOLECYSTECTOMY    . CHOLECYSTECTOMY, LAPAROSCOPIC    . ESOPHAGOGASTRODUODENOSCOPY (EGD) WITH PROPOFOL N/A 03/29/2015   Procedure: ESOPHAGOGASTRODUODENOSCOPY (EGD) WITH PROPOFOL;  Surgeon: Josefine Class, MD;  Location: [facility] ENDOSCOPY;  Service: Endoscopy;  Laterality: N/A;  . HERNIA REPAIR    . laproscopy    . TONSILLECTOMY    . uteral suspension  Family History:  Family History  Problem Relation Age of Onset  . Heart disease Father   . Hearing loss Father   . COPD Father   . Depression Father   . Stroke Father   . Vision loss Father   . Varicose Veins Father   . Cancer Mother        lung  .  Depression Sister   . Arthritis Sister   . Arthritis Brother   . Hernia Brother   . Anxiety disorder Brother   . Arthritis Brother   . Hernia Brother   . Anxiety disorder Brother   . Urolithiasis Neg Hx   . Kidney disease Neg Hx   . Kidney cancer Neg Hx   . Prostate cancer Neg Hx    Family Psychiatric  History: Some anxiety Social History:  Social History   Substance and Sexual Activity  Alcohol Use No     Social History   Substance and Sexual Activity  Drug Use No    Social History   Socioeconomic History  . Marital status: Single    Spouse name: Not on file  . Number of children: 0  . Years of education: 1.5 years of college  . Highest education level: Some college, no degree  Occupational History  . Occupation: Retired  Scientific laboratory technician  . Financial resource strain: Very hard  . Food insecurity:    Worry: Often true    Inability: Often true  . Transportation needs:    Medical: Yes    Non-medical: Yes  Tobacco Use  . Smoking status: Former Smoker    Last attempt to quit: 11/17/1976    Years since quitting: 41.1  . Smokeless tobacco: Never Used  Substance and Sexual Activity  . Alcohol use: No  . Drug use: No  . Sexual activity: Not Currently  Lifestyle  . Physical activity:    Days per week: 0 days    Minutes per session: 0 min  . Stress: Very much  Relationships  . Social connections:    Talks on phone: Never    Gets together: Never    Attends religious service: More than 4 times per year    Active member of club or organization: No    Attends meetings of clubs or organizations: Never    Relationship status: Not on file  Other Topics Concern  . Not on file  Social History Narrative   Lives at home alone.   Right-handed.   No daily caffeine use.   Additional Social History:    Allergies:   Allergies  Allergen Reactions  . Bee Venom Hives  . Darvon [Propoxyphene] Anaphylaxis and Hives    Hives/throat swelling   . Dicyclomine Itching  .  Other Hives    Spider bites cause hives  . Oxycodone-Acetaminophen Nausea And Vomiting and Other (See Comments)    hallucinations   . Peanuts [Peanut Oil] Anaphylaxis and Hives  . Penicillins Hives    Has patient had a PCN reaction causing immediate rash, facial/tongue/throat swelling, SOB or lightheadedness with hypotension: Unknown Has patient had a PCN reaction causing severe rash involving mucus membranes or skin necrosis: Unknown Has patient had a PCN reaction that required hospitalization: Unknown Has patient had a PCN reaction occurring within the last 10 years: Unknown If all of the above answers are "NO", then may proceed with Cephalosporin use.  . Sulfa Antibiotics Anaphylaxis, Hives and Itching  . Sulfacetamide Sodium Anaphylaxis, Hives and Itching  . Ultram [Tramadol] Hives  .  Amikacin Nausea And Vomiting  . Aspirin Hives  . Doxycycline Photosensitivity  . Haloperidol Nausea And Vomiting  . Hymenoptera Venom Preparations Hives    Reaction to spider bites  . Imipramine Hives    Reaction to Tofranil  . Keflex [Cephalexin] Hives  . Lactose Intolerance (Gi)   . Sulfur Hives  . Adhesive [Tape] Hives and Rash  . Albuterol Palpitations  . Ciprofloxacin Other (See Comments)    tendonopathy  . Hydroxyzine Anxiety and Itching  . Peanut Butter Flavor Anxiety  . Prednisone Nausea And Vomiting, Anxiety and Other (See Comments)    Crazy mood swings, strange thoughts (pt does tolerate cortisone injections)    Labs: No results found for this or any previous visit (from the past 48 hour(s)).  Current Facility-Administered Medications  Medication Dose Route Frequency Provider Last Rate Last Dose  . acetaminophen (TYLENOL) tablet 650 mg  650 mg Oral Q6H PRN Clapacs, Madie Reno, MD   650 mg at 12/27/17 0111  . alum & mag hydroxide-simeth (MAALOX/MYLANTA) 200-200-20 MG/5ML suspension 15 mL  15 mL Oral Q4H PRN Delman Kitten, MD   15 mL at 12/26/17 2311  . amLODipine (NORVASC) tablet 5 mg  5  mg Oral Daily Clapacs, Madie Reno, MD   5 mg at 12/28/17 0956  . calcium carbonate (TUMS - dosed in mg elemental calcium) chewable tablet 200 mg of elemental calcium  1 tablet Oral TID Clapacs, John T, MD   200 mg of elemental calcium at 12/28/17 1303  . cholecalciferol (VITAMIN D3) tablet 1,000 Units  1,000 Units Oral Daily Clapacs, Madie Reno, MD   1,000 Units at 12/28/17 1303  . cycloSPORINE (RESTASIS) 0.05 % ophthalmic emulsion 1 drop  1 drop Both Eyes BID Clapacs, Madie Reno, MD   1 drop at 12/28/17 0958  . docusate sodium (COLACE) capsule 100 mg  100 mg Oral BID Clapacs, Madie Reno, MD   100 mg at 12/28/17 1304  . feeding supplement (ENSURE ENLIVE) (ENSURE ENLIVE) liquid 237 mL  237 mL Oral BID BM Clapacs, John T, MD   237 mL at 12/27/17 1505  . levothyroxine (SYNTHROID, LEVOTHROID) tablet 100 mcg  100 mcg Oral Q0600 Clapacs, Madie Reno, MD   100 mcg at 12/28/17 0955  . linaclotide (LINZESS) capsule 145 mcg  145 mcg Oral QAC breakfast Clapacs, Madie Reno, MD   145 mcg at 12/28/17 0956  . LORazepam (ATIVAN) tablet 2 mg  2 mg Oral Q4H PRN Clapacs, John T, MD      . PARoxetine (PAXIL-CR) 24 hr tablet 25 mg  25 mg Oral Daily Clapacs, Madie Reno, MD   25 mg at 12/28/17 1303  . polyethylene glycol (MIRALAX / GLYCOLAX) packet 17 g  17 g Oral Daily Clapacs, Madie Reno, MD   17 g at 12/28/17 1317  . polyvinyl alcohol (LIQUIFILM TEARS) 1.4 % ophthalmic solution 1 drop  1 drop Both Eyes PRN Clapacs, John T, MD      . prazosin (MINIPRESS) capsule 2 mg  2 mg Oral QHS Paulette Blanch, MD   2 mg at 12/27/17 0011  . pyridOXINE (VITAMIN B-6) tablet 50 mg  50 mg Oral BID Clapacs, Madie Reno, MD   50 mg at 12/28/17 1304  . QUEtiapine (SEROQUEL) tablet 25 mg  25 mg Oral QHS Paulette Blanch, MD      . sodium phosphate (FLEET) 7-19 GM/118ML enema 1 enema  1 enema Rectal Daily PRN Clapacs, Madie Reno, MD   1 enema at 12/26/17  1528  . traZODone (DESYREL) tablet 50 mg  50 mg Oral QHS Paulette Blanch, MD   50 mg at 12/27/17 2217   Current Outpatient Medications   Medication Sig Dispense Refill  . albuterol (PROVENTIL HFA;VENTOLIN HFA) 108 (90 Base) MCG/ACT inhaler Inhale 2 puffs into the lungs every 6 (six) hours as needed for wheezing or shortness of breath. 1 Inhaler 0  . amLODipine (NORVASC) 5 MG tablet Take 1 tablet (5 mg total) by mouth daily. 30 tablet 0  . calcium carbonate (TUMS - DOSED IN MG ELEMENTAL CALCIUM) 500 MG chewable tablet Chew 1 tablet (200 mg of elemental calcium total) by mouth 3 (three) times daily. 90 tablet 0  . Cholecalciferol 25 MCG (1000 UT) tablet Take 1 tablet (1,000 Units total) by mouth daily. 30 tablet 1  . cycloSPORINE (RESTASIS) 0.05 % ophthalmic emulsion Place 1 drop into both eyes 2 (two) times daily. 0.4 mL 1  . docusate sodium (COLACE) 100 MG capsule Take 1 capsule (100 mg total) by mouth 2 (two) times daily. 60 capsule 0  . Icosapent Ethyl (VASCEPA) 1 g CAPS Two capsules by mouth twice a day (to help with triglycerides) 120 capsule 0  . levothyroxine (SYNTHROID, LEVOTHROID) 100 MCG tablet Take 1 tablet (100 mcg total) by mouth daily at 6 (six) AM. 30 tablet 0  . lidocaine (LIDODERM) 5 % Place 1 patch onto the skin daily. Remove & Discard patch within 12 hours or as directed by MD 30 patch 0  . linaclotide (LINZESS) 290 MCG CAPS capsule Take 1 capsule (290 mcg total) by mouth daily before breakfast. 30 capsule 0  . magnesium oxide (MAG-OX) 400 (241.3 Mg) MG tablet Take 0.5 tablets (200 mg total) by mouth 2 (two) times daily. 60 tablet 1  . Multiple Vitamin (MULTIVITAMIN WITH MINERALS) TABS tablet Take 1 tablet by mouth daily. 30 tablet 1  . omeprazole (PRILOSEC) 20 MG capsule Take 1 capsule (20 mg total) by mouth daily. 30 capsule 0  . PARoxetine (PAXIL-CR) 25 MG 24 hr tablet Take 1 tablet (25 mg total) by mouth daily. 30 tablet 0  . polyethylene glycol (MIRALAX / GLYCOLAX) packet Take 17 g by mouth daily. 30 each 0  . polyvinyl alcohol (LIQUIFILM TEARS) 1.4 % ophthalmic solution Place 1 drop into both eyes as needed  for dry eyes. 15 mL 0  . prazosin (MINIPRESS) 2 MG capsule Take 1 capsule (2 mg total) by mouth at bedtime. 30 capsule 1  . pyridoxine (B-6) 100 MG tablet Take 1 tablet (100 mg total) by mouth 2 (two) times daily. 60 tablet 0  . QUEtiapine (SEROQUEL) 25 MG tablet Take 1 tablet (25 mg total) by mouth at bedtime. 30 tablet 0  . traZODone (DESYREL) 100 MG tablet Take 0.5 tablets (50 mg total) by mouth at bedtime. 30 tablet 0    Musculoskeletal: Strength & Muscle Tone: within normal limits Gait & Station: normal Patient leans: N/A  Psychiatric Specialty Exam: Physical Exam  Nursing note and vitals reviewed. Constitutional: She appears well-developed and well-nourished.  HENT:  Head: Normocephalic and atraumatic.  Eyes: Pupils are equal, round, and reactive to light. Conjunctivae are normal.  Neck: Normal range of motion.  Cardiovascular: Regular rhythm and normal heart sounds.  Respiratory: Effort normal. No respiratory distress.  GI: Soft.  Musculoskeletal: Normal range of motion.  Neurological: She is alert.  Skin: Skin is warm and dry.  Psychiatric: Her mood appears anxious. Her affect is angry, labile and inappropriate. Her speech is  rapid and/or pressured and tangential. She is agitated. Thought content is paranoid. Cognition and memory are impaired. She expresses impulsivity and inappropriate judgment.    Review of Systems  Constitutional: Negative.   HENT: Negative.   Eyes: Negative.   Respiratory: Negative.   Cardiovascular: Negative.   Gastrointestinal: Positive for constipation.  Musculoskeletal: Negative.   Skin: Negative.   Neurological: Negative.   Psychiatric/Behavioral: Positive for depression and memory loss. Negative for substance abuse and suicidal ideas. The patient is nervous/anxious and has insomnia.     Blood pressure (!) 147/72, pulse 62, temperature 98.2 F (36.8 C), temperature source Oral, resp. rate 16, height 5\' 3"  (1.6 m), weight 63.5 kg, SpO2 100  %.Body mass index is 24.8 kg/m.  General Appearance: Disheveled  Eye Contact:  Fair  Speech:  Pressured  Volume:  Increased  Mood:  Irritable  Affect:  Labile  Thought Process:  Disorganized  Orientation:  Full (Time, Place, and Person)  Thought Content:  Illogical and Paranoid Ideation  Suicidal Thoughts:  No  Homicidal Thoughts:  No  Memory:  Immediate;   Fair Recent;   Fair Remote;   Fair  Judgement:  Impaired  Insight:  Shallow  Psychomotor Activity:  Restlessness  Concentration:  Concentration: Poor  Recall:  [place] of Knowledge:  Good  Language:  Good  Akathisia:  No  Handed:  Right  AIMS (if indicated):     Assets:  Desire for Improvement  ADL's:  Impaired  Cognition:  Impaired,  Mild  Sleep:        Treatment Plan Summary: Daily contact with patient to assess and evaluate symptoms and progress in treatment, Medication management and Plan Patient is now even more agitated than previously.  Not violent not assaulting anyone not suicidal but very demanding.  Showing more manic features.  I think the appropriate thing at this point is to go back to her previous plan of trying to get her to geriatric psychiatry.  I am going to up the mood stabilizer dosage and have spoken with TTS and the emergency room physician.  We will try and get her referred out as we have no safe place for her to stay and she is decompensating.  Disposition: Recommend psychiatric Inpatient admission when medically cleared.  Alethia Berthold, MD 12/28/2017 6:04 PM
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
## 2017-12-28 NOTE — ED Notes (Signed)
Pt still not wanting to eat breakfast.  Pt will not take the remainder of her medications.

## 2017-12-29 ENCOUNTER — Emergency Department: Payer: Medicare Other

## 2017-12-29 DIAGNOSIS — F319 Bipolar disorder, unspecified: Secondary | ICD-10-CM | POA: Diagnosis not present

## 2017-12-29 DIAGNOSIS — R140 Abdominal distension (gaseous): Secondary | ICD-10-CM | POA: Diagnosis not present

## 2017-12-29 LAB — BASIC METABOLIC PANEL
Anion gap: 7 (ref 5–15)
BUN: 11 mg/dL (ref 8–23)
CHLORIDE: 103 mmol/L (ref 98–111)
CO2: 26 mmol/L (ref 22–32)
Calcium: 9.6 mg/dL (ref 8.9–10.3)
Creatinine, Ser: 0.81 mg/dL (ref 0.44–1.00)
GFR calc Af Amer: >60 mL/min (ref >60)
GFR calc non Af Amer: >60 mL/min (ref >60)
Glucose, Bld: 119 mg/dL — ABNORMAL HIGH (ref 70–99)
Potassium: 5.5 mmol/L — ABNORMAL HIGH (ref 3.5–5.1)
Sodium: 136 mmol/L (ref 135–145)

## 2017-12-29 NOTE — ED Notes (Signed)
Pt. Started crying in room stating "Nobody listens to me, I don't want to be homeless".  Pt. Told we have people working on your case, looking for a safe place for you to live, we would not put you out with no plan"  Pt. Advised to come to this nurse for any questions or concerns.  Pt. Reasurred, If any news comes in about a possible place, we will talk to you about it first.  Pt. Feels better about situation now.

## 2017-12-29 NOTE — ED Notes (Signed)
VOL/Pending placement 

## 2017-12-29 NOTE — ED Provider Notes (Signed)
-----------------------------------------   2:26 AM on 12/29/2017 -----------------------------------------   Blood pressure (!) 147/72, pulse 62, temperature 98.2 F (36.8 C), temperature source Oral, resp. rate 16, height 5\' 3"  (1.6 m), weight 63.5 kg, SpO2 100 %.  The patient had no acute events since last update.  Calm and cooperative at this time.  Disposition is pending Psychiatry/Behavioral Medicine team recommendations.     Nena Polio, MD 12/29/17 [phone]

## 2017-12-29 NOTE — ED Notes (Signed)
Spoke with Claudine LCSW about patient's disposition.   Patient is not to be told about of her transfer or that she is IVC. Claudine LCSW will inform pt in the a.m.   Shared info with [facility] MHT and security, who like this writer will pass this off at their shift changes.

## 2017-12-29 NOTE — Progress Notes (Signed)
LCSW received a call from patient who was able to access my work cell phone number demanding to know her plan of care.   LCSW was informed the number was provided to patient by an oversight and manipulation from the patient. LCSW did agree to discuss patient plan of care as soon as other professionals are consulted.  LCSW received call from Richardson Landry at [employer] that that patient will be now reviewed from doctor and he will call this worker back. PATIENT ACCEPTED    Patient has been ACCEPTED by Dr Glena Norfolk 900 Call report number [phone]  Consulted with EDP and EDRN and ED secretary  Faxed IVC documents to Richardson Landry at [place].  [employer] LCSW [phone]

## 2017-12-29 NOTE — ED Notes (Signed)
BEHAVIORAL HEALTH ROUNDING  Patient sleeping: No.  Patient alert and oriented: yes  Behavior appropriate: Yes. ; If no, describe:  Nutrition and fluids offered: Yes  Toileting and hygiene offered: Yes  Sitter present: not applicable, Q 15 min safety rounds and observation via security camera. Law enforcement present: Yes ODS  

## 2017-12-29 NOTE — ED Notes (Signed)
Pt has been largely cooperative on the unit, but is grouchy and oppositional, making multiple demands that are out of this writer's hands and cannot be met. She has denied SI, HI, and AVH. After she urinated on herself while sneezing, she showered. Her linens were changed. Pt remains safe with checks, rounds, and camera monitoring in place.

## 2017-12-29 NOTE — Progress Notes (Signed)
Referral information for Psychiatric Hospitalization faxed to;  To be refaxed to these facilities By [employer] [phone]),     [facility] [phone]),   [place] [phone], [phone], [phone] or [phone]),    [place] [phone] or [phone])   [facility] [phone]),    [facility] [phone]),    [facility] [phone]),    [facility] ([phone] or [phone]),  [facility] [phone] or [phone]),    [facility] [phone]).

## 2017-12-29 NOTE — ED Provider Notes (Signed)
-----------------------------------------   5:28 PM on 12/29/2017 -----------------------------------------   Blood pressure (!) 147/72, pulse 62, temperature 98.2 F (36.8 C), temperature source Oral, resp. rate 16, height 5\' 3"  (1.6 m), weight 63.5 kg, SpO2 100 %.  Patient with worsening agitation.  Denying that she has a diagnosis of bipolar disorder.  Already accepted to geriatric psychiatry.  Will complete IVC paperwork at this time.     Orbie Pyo, MD 12/29/17 [phone]

## 2017-12-29 NOTE — ED Notes (Signed)
VOL  PENDING  PLACEMENT 

## 2017-12-29 NOTE — ED Notes (Signed)
Pt returned from xray

## 2017-12-29 NOTE — ED Notes (Signed)
ENVIRONMENTAL ASSESSMENT  Potentially harmful objects out of patient reach: Yes.  Personal belongings secured: Yes.  Patient dressed in hospital provided attire only: Yes.  Plastic bags out of patient reach: Yes.  Patient care equipment (cords, cables, call bells, lines, and drains) shortened, removed, or accounted for: Yes.  Equipment and supplies removed from bottom of stretcher: Yes.  Potentially toxic materials out of patient reach: Yes.  Sharps container removed or out of patient reach: Yes.   BEHAVIORAL HEALTH ROUNDING  Patient sleeping: No.  Patient alert and oriented: yes  Behavior appropriate: Yes. ; If no, describe:  Nutrition and fluids offered: Yes  Toileting and hygiene offered: Yes  Sitter present: not applicable, Q 15 min safety rounds and observation via security camera. Law enforcement present: Yes ODS  ED [place]  Is the patient under IVC or is there intent for IVC: Yes.  Is the patient medically cleared: Yes.  Is there vacancy in the ED BHU: Yes.  Is the population mix appropriate for patient: Yes.  Is the patient awaiting placement in inpatient or outpatient setting: Yes.  Has the patient had a psychiatric consult: Yes.  Survey of unit performed for contraband, proper placement and condition of furniture, tampering with fixtures in bathroom, shower, and each patient room: Yes. ; Findings: All clear  APPEARANCE/BEHAVIOR  calm, cooperative and adequate rapport can be established  NEURO ASSESSMENT  Orientation: time, place and person  Hallucinations: No.None noted (Hallucinations)  Speech: Normal  Gait: normal  RESPIRATORY ASSESSMENT  WNL  CARDIOVASCULAR ASSESSMENT  WNL  GASTROINTESTINAL ASSESSMENT  WNL  EXTREMITIES  WNL  PLAN OF CARE  Provide calm/safe environment. Vital signs assessed TID. ED BHU Assessment once each 12-hour shift. Collaborate with TTS daily or as condition indicates. Assure the ED provider has rounded once each shift.  Provide and encourage hygiene. Provide redirection as needed. Assess for escalating behavior; address immediately and inform ED provider.  Assess family dynamic and appropriateness for visitation as needed: Yes. ; If necessary, describe findings:  Educate the patient/family about BHU procedures/visitation: Yes. ; If necessary, describe findings: Pt is calm and cooperative at this time. Pt understanding and accepting of unit procedures/rules. Will continue to monitor with Q 15 min safety rounds and observation via security camera.

## 2017-12-29 NOTE — ED Notes (Signed)
While giving pt her medications pt co abd discomfort and feeling like she needs to have a good BM. Asking to see the doctor. Explained to pt that I would inform the doctor. Dr Clearnce Hasten informed and new orders received.

## 2017-12-29 NOTE — ED Notes (Signed)
Pt to xray with EDT and ODS officer.

## 2017-12-29 NOTE — ED Notes (Signed)
This Probation officer and two other staff in ED were unable to print [employer] label for BMP. Gwen in lab said to send with pt label instead.

## 2017-12-29 NOTE — Progress Notes (Signed)
LCSW consulted Dr Weber Cooks and he has agreed to IVC ms Kozar. Spoke to [facility] ED Network engineer and he will prepare the documents for signature and review.  LCSW called and spoke to Richardson Landry at [facility] and he has agreed to take patient on Sunday. LCSW's faxed over documentation for review and will forward IVC papers once completed .   [employer] LCSW [phone]

## 2017-12-29 NOTE — Progress Notes (Signed)
LCSW called [place] ED nurse and explained due to time constraints unable to meet patient . LCSW will meet with patient early in the morning to discuss her being IVC and going to [employer] health by sheriff in the morning. LCSW has established  Rapport.Marland Kitchen  LCSW REQUESTED THAT NO STAFF CONVEY THIS INFO TO PATIENT IF POSSIBLE.  [employer] LCSW [phone]

## 2017-12-30 DIAGNOSIS — F319 Bipolar disorder, unspecified: Secondary | ICD-10-CM | POA: Diagnosis not present

## 2017-12-30 NOTE — ED Provider Notes (Signed)
-----------------------------------------   5:25 AM on 12/30/2017 -----------------------------------------   Blood pressure (!) 144/84, pulse 77, temperature 98.2 F (36.8 C), temperature source Oral, resp. rate 16, height 5\' 3"  (1.6 m), weight 63.5 kg, SpO2 97 %.  The patient had no acute events since last update.  Accepted to [facility] and should be transported sometime later this morning.     Paulette Blanch, MD 12/30/17 [phone]

## 2017-12-30 NOTE — ED Notes (Signed)
emtala reviewed by this RN 

## 2017-12-30 NOTE — ED Notes (Signed)
BEHAVIORAL HEALTH ROUNDING Patient sleeping: Yes.   Patient alert and oriented: not applicable SLEEPING Behavior appropriate: Yes.  ; If no, describe: SLEEPING Nutrition and fluids offered: No SLEEPING Toileting and hygiene offered: NoSLEEPING Sitter present: not applicable, Q 15 min safety rounds and observation via security camera. Law enforcement present: Yes ODS 

## 2017-12-30 NOTE — Progress Notes (Signed)
LCSW called Nurse admission manager of [facility]  [phone] called this worker back and stated they WILL ACCEPT HER now and arranged special staffing measures.  LCSW called ED secretary and he was able to arrange transportation again and LCSW notified [place] ED Nurse.  [employer] LCSW [phone]

## 2017-12-30 NOTE — ED Notes (Signed)
Hourly rounding reveals patient sleeping in room. No complaints, stable, in no acute distress. Q15 minute rounds and monitoring via Security Cameras to continue. 

## 2017-12-30 NOTE — ED Notes (Signed)
BEHAVIORAL HEALTH ROUNDING  Patient sleeping: No.  Patient alert and oriented: yes  Behavior appropriate: Yes. ; If no, describe:  Nutrition and fluids offered: Yes  Toileting and hygiene offered: Yes  Sitter present: not applicable, Q 15 min safety rounds and observation via security camera. Law enforcement present: Yes ODS  

## 2017-12-30 NOTE — Progress Notes (Signed)
LCSW received a call from [facility] and due to nurse staffing issues patient will not be accepted. LCSW spoke with Jerene Pitch in Admissions.  LCSW called [place] ED nurse and ED secretary to cancel the sherrif transport.  LCSW will notify EDP as well.  [employer] LCSW [phone]

## 2017-12-30 NOTE — ED Provider Notes (Signed)
-----------------------------------------   12:55 AM on 12/30/2017 -----------------------------------------   Blood pressure (!) 144/84, pulse 77, temperature 98.2 F (36.8 C), temperature source Oral, resp. rate 16, height 5\' 3"  (1.6 m), weight 63.5 kg, SpO2 97 %.  The patient had no acute events since last update.  Calm and cooperative at this time.  Patient had requested to see me because of ongoing IBS symptoms.  Patient says that she was given multiple medications to help her move her bowels today and says that she has now had 2 bowel movements.  I examined her abdomen and she is nondistended and soft throughout.  KUB without pathology.   Orbie Pyo, MD 12/30/17 [phone]

## 2017-12-30 NOTE — Progress Notes (Signed)
LCSW spoke with patient and she is agreeable to go to this treatment geri Psych unit.  LCSW did re-iterate that [facility] has accepted her as per [employer].  [employer] LCSW [phone]

## 2017-12-30 NOTE — ED Notes (Signed)
Patient transferred with [facility] Dept.to [place], patient and sheriff received discharge papers. Patient received belongings and verbalized she has received all of her belongings. Patient cooperative, Denies SI/HI AVH. Vital signs taken. NAD noted.

## 2017-12-31 DIAGNOSIS — J449 Chronic obstructive pulmonary disease, unspecified: Secondary | ICD-10-CM | POA: Diagnosis not present

## 2017-12-31 DIAGNOSIS — I10 Essential (primary) hypertension: Secondary | ICD-10-CM | POA: Diagnosis not present

## 2017-12-31 DIAGNOSIS — R300 Dysuria: Secondary | ICD-10-CM | POA: Diagnosis not present

## 2018-01-01 DIAGNOSIS — K5900 Constipation, unspecified: Secondary | ICD-10-CM | POA: Diagnosis not present

## 2018-01-01 DIAGNOSIS — R21 Rash and other nonspecific skin eruption: Secondary | ICD-10-CM | POA: Diagnosis not present

## 2018-01-02 ENCOUNTER — Telehealth: Payer: Self-pay | Admitting: Family Medicine

## 2018-01-02 DIAGNOSIS — R109 Unspecified abdominal pain: Secondary | ICD-10-CM | POA: Diagnosis not present

## 2018-01-02 DIAGNOSIS — K5900 Constipation, unspecified: Secondary | ICD-10-CM | POA: Diagnosis not present

## 2018-01-02 NOTE — Telephone Encounter (Signed)
I received a note from after hours center that patient wanted me to contact her; she is currently at [employer]; I am expecting a lengthy phone call and will contact her on the way home to allow her plenty of time to talk; documentation to follow after the call

## 2018-01-02 NOTE — Telephone Encounter (Signed)
I spoke with staff member, Angela [name] I explained I received a call with concerns from the patient and a request for me to call I explained that she does have hyponatremia The nurse said that they have addressed that Doctors have written an order for her Gatorade and it is available for her there After speaking with Angela [name], we decided that it would be probably most helpful for her to let the patient know that I called, but to prevent any possibly inflammatory phone call or complications, I would simply have the staff there let her know that I am thinking of her rather than ger her on the phone personally and possibly set back any progress she is making I will entrust her care and orders and any other needed concerns from a medical/psychiatric standpoint to her attending and staff there

## 2018-01-06 DIAGNOSIS — R05 Cough: Secondary | ICD-10-CM | POA: Diagnosis not present

## 2018-01-06 DIAGNOSIS — K581 Irritable bowel syndrome with constipation: Secondary | ICD-10-CM | POA: Diagnosis not present

## 2018-01-06 DIAGNOSIS — L309 Dermatitis, unspecified: Secondary | ICD-10-CM | POA: Diagnosis not present

## 2018-01-07 ENCOUNTER — Ambulatory Visit: Payer: Self-pay | Admitting: Family Medicine

## 2018-01-10 ENCOUNTER — Ambulatory Visit: Payer: Self-pay | Admitting: Gastroenterology

## 2018-01-10 DIAGNOSIS — M792 Neuralgia and neuritis, unspecified: Secondary | ICD-10-CM | POA: Diagnosis not present

## 2018-01-10 DIAGNOSIS — Z9181 History of falling: Secondary | ICD-10-CM | POA: Diagnosis not present

## 2018-01-10 DIAGNOSIS — G8921 Chronic pain due to trauma: Secondary | ICD-10-CM | POA: Diagnosis not present

## 2018-01-10 DIAGNOSIS — R296 Repeated falls: Secondary | ICD-10-CM | POA: Diagnosis not present

## 2018-01-10 NOTE — Telephone Encounter (Signed)
I talked to staff there; I explained this is the 3rd or 4th time she has called since admission; I cannot write orders there; they said she had called 911, has been out of sorts; they did not think it would be a good idea for me to speak with her; I asked them to pass along to her that I called, I am thinking of her; we'll have her attending and team there take care of her and we'll see her when she gets out

## 2018-01-10 NOTE — Telephone Encounter (Signed)
Copied from [place] [phone]. Topic: General - Other >> Jan 08, 2018  1:53 PM Yvette Rack wrote: Reason for CRM: pt calling wanting Dr Sanda Klein to give her a call ASAS she states that its Urgent she want her to call her at [phone] patient # [id number] unit 900 she's at [facility] pt states that she want to wish Dr Sanda Klein and everyone a Angela Nevin Christmas and that the Hospital isn't feeding her well she's not get the Nutritions like she should she feel like her DM is worst she need a healthy diet plan all they give her is cardbs and that they haven't ran any labs on her they tried to stick her three times with no success she feels scared and body and brain feel fuzzy she states that she will not give out any release of information to anyone but DR Sanda Klein she will not speak to anyone but Dr Sanda Klein  she is aware that DR Sanda Klein will not be back in the office until Thursday

## 2018-01-11 DIAGNOSIS — Z79899 Other long term (current) drug therapy: Secondary | ICD-10-CM | POA: Diagnosis not present

## 2018-01-24 ENCOUNTER — Ambulatory Visit: Payer: Self-pay | Admitting: Family Medicine

## 2018-01-25 ENCOUNTER — Emergency Department
Admission: EM | Admit: 2018-01-25 | Discharge: 2018-01-25 | Disposition: A | Discharge status: Home / Self Care | Payer: Medicare Other | Attending: Emergency Medicine | Admitting: Emergency Medicine

## 2018-01-25 ENCOUNTER — Ambulatory Visit
Admission: RE | Admit: 2018-01-25 | Discharge: 2018-01-25 | Disposition: A | Discharge status: Home / Self Care | Payer: Medicare Other | Source: Home / Self Care | Attending: Family Medicine | Admitting: Family Medicine

## 2018-01-25 ENCOUNTER — Other Ambulatory Visit: Payer: Self-pay

## 2018-01-25 ENCOUNTER — Ambulatory Visit
Admission: RE | Admit: 2018-01-25 | Discharge: 2018-01-25 | Disposition: A | Discharge status: Home / Self Care | Payer: Medicare Other | Source: Ambulatory Visit | Attending: Family Medicine | Admitting: Family Medicine

## 2018-01-25 DIAGNOSIS — R140 Abdominal distension (gaseous): Secondary | ICD-10-CM | POA: Insufficient documentation

## 2018-01-25 DIAGNOSIS — K219 Gastro-esophageal reflux disease without esophagitis: Secondary | ICD-10-CM | POA: Diagnosis not present

## 2018-01-25 DIAGNOSIS — R10817 Generalized abdominal tenderness: Secondary | ICD-10-CM | POA: Diagnosis not present

## 2018-01-25 DIAGNOSIS — E039 Hypothyroidism, unspecified: Secondary | ICD-10-CM | POA: Diagnosis not present

## 2018-01-25 DIAGNOSIS — Z87891 Personal history of nicotine dependence: Secondary | ICD-10-CM | POA: Diagnosis not present

## 2018-01-25 DIAGNOSIS — Z5321 Procedure and treatment not carried out due to patient leaving prior to being seen by health care provider: Secondary | ICD-10-CM | POA: Diagnosis not present

## 2018-01-25 DIAGNOSIS — R109 Unspecified abdominal pain: Secondary | ICD-10-CM | POA: Diagnosis not present

## 2018-01-25 DIAGNOSIS — R1032 Left lower quadrant pain: Secondary | ICD-10-CM | POA: Diagnosis not present

## 2018-01-25 DIAGNOSIS — I10 Essential (primary) hypertension: Secondary | ICD-10-CM | POA: Diagnosis not present

## 2018-01-25 DIAGNOSIS — R1031 Right lower quadrant pain: Secondary | ICD-10-CM | POA: Diagnosis not present

## 2018-01-25 DIAGNOSIS — K449 Diaphragmatic hernia without obstruction or gangrene: Secondary | ICD-10-CM | POA: Diagnosis not present

## 2018-01-25 LAB — COMPREHENSIVE METABOLIC PANEL
ALT: 26 U/L (ref 0–44)
AST: 31 U/L (ref 15–41)
Albumin: 4.1 g/dL (ref 3.5–5.0)
Alkaline Phosphatase: 84 U/L (ref 38–126)
Anion gap: 8 (ref 5–15)
BUN: 14 mg/dL (ref 8–23)
CHLORIDE: 101 mmol/L (ref 98–111)
CO2: 24 mmol/L (ref 22–32)
CREATININE: 0.7 mg/dL (ref 0.44–1.00)
Calcium: 8.7 mg/dL — ABNORMAL LOW (ref 8.9–10.3)
GFR calc Af Amer: >60 mL/min (ref >60)
GFR calc non Af Amer: >60 mL/min (ref >60)
Glucose, Bld: 120 mg/dL — ABNORMAL HIGH (ref 70–99)
POTASSIUM: 3.6 mmol/L (ref 3.5–5.1)
Sodium: 133 mmol/L — ABNORMAL LOW (ref 135–145)
Total Bilirubin: 0.5 mg/dL (ref 0.3–1.2)
Total Protein: 6.9 g/dL (ref 6.5–8.1)

## 2018-01-25 LAB — CBC
HCT: 37.9 % (ref 36.0–46.0)
Hemoglobin: 12.7 g/dL (ref 12.0–15.0)
MCH: 30.8 pg (ref 26.0–34.0)
MCHC: 33.5 g/dL (ref 30.0–36.0)
MCV: 92 fL (ref 80.0–100.0)
Platelets: 274 10*3/uL (ref 150–400)
RBC: 4.12 MIL/uL (ref 3.87–5.11)
RDW: 13.7 % (ref 11.5–15.5)
WBC: 4.7 10*3/uL (ref 4.0–10.5)
nRBC: 0 % (ref 0.0–0.2)

## 2018-01-25 LAB — URINALYSIS, COMPLETE (UACMP) WITH MICROSCOPIC
BILIRUBIN URINE: NEGATIVE
Bacteria, UA: NONE SEEN
Glucose, UA: NEGATIVE mg/dL
Hgb urine dipstick: NEGATIVE
KETONES UR: NEGATIVE mg/dL
Leukocytes, UA: NEGATIVE
NITRITE: NEGATIVE
Protein, ur: NEGATIVE mg/dL
SPECIFIC GRAVITY, URINE: 1.002 — AB (ref 1.005–1.030)
pH: 7 (ref 5.0–8.0)

## 2018-01-25 LAB — LIPASE, BLOOD: LIPASE: 37 U/L (ref 11–51)

## 2018-01-25 NOTE — ED Notes (Signed)
No answer when called from lobby x3. Pt LWBS after Triage and to be removed from waiting room queue at this time.

## 2018-01-25 NOTE — ED Notes (Signed)
No answer when called from lobby x2. 

## 2018-01-25 NOTE — ED Triage Notes (Signed)
First Nurse Note:  ARrives for ED evaluation of bowel obstruction.  STates had outpatient imaging at [facility] today.  C/O diarrhea and bowel irregularly recently.  Also states has had 2 hernia surgeries in the past.

## 2018-01-25 NOTE — ED Notes (Signed)
No answer when called from lobby x1 

## 2018-01-25 NOTE — ED Triage Notes (Signed)
Pt told today after having her abd x-ray that she could have an obstruction or an ileus and told to come to the ER, pt reports that she hasn't been feeling well, no appetite, no BM unless she takes a lot of laxatives and reports success, states that it took her 4days. Pt reports dizziness and reports low bp as well

## 2018-01-26 DIAGNOSIS — K449 Diaphragmatic hernia without obstruction or gangrene: Secondary | ICD-10-CM | POA: Diagnosis not present

## 2018-01-28 ENCOUNTER — Ambulatory Visit (INDEPENDENT_AMBULATORY_CARE_PROVIDER_SITE_OTHER): Payer: Medicare Other | Admitting: Family Medicine

## 2018-01-28 ENCOUNTER — Other Ambulatory Visit: Payer: Self-pay | Admitting: Psychiatry

## 2018-01-28 ENCOUNTER — Encounter: Payer: Self-pay | Admitting: Family Medicine

## 2018-01-28 VITALS — BP 108/70 | HR 78 | Temp 98.0°F | Ht 63.0 in | Wt 134.1 lb

## 2018-01-28 DIAGNOSIS — M4712 Other spondylosis with myelopathy, cervical region: Secondary | ICD-10-CM

## 2018-01-28 DIAGNOSIS — F3164 Bipolar disorder, current episode mixed, severe, with psychotic features: Secondary | ICD-10-CM

## 2018-01-28 DIAGNOSIS — M5010 Cervical disc disorder with radiculopathy, unspecified cervical region: Secondary | ICD-10-CM

## 2018-01-28 DIAGNOSIS — F4310 Post-traumatic stress disorder, unspecified: Secondary | ICD-10-CM

## 2018-01-28 DIAGNOSIS — F609 Personality disorder, unspecified: Secondary | ICD-10-CM | POA: Diagnosis not present

## 2018-01-28 DIAGNOSIS — Z1211 Encounter for screening for malignant neoplasm of colon: Secondary | ICD-10-CM

## 2018-01-28 DIAGNOSIS — G3184 Mild cognitive impairment, so stated: Secondary | ICD-10-CM

## 2018-01-28 DIAGNOSIS — F319 Bipolar disorder, unspecified: Secondary | ICD-10-CM

## 2018-01-28 DIAGNOSIS — R079 Chest pain, unspecified: Secondary | ICD-10-CM

## 2018-01-28 DIAGNOSIS — G5603 Carpal tunnel syndrome, bilateral upper limbs: Secondary | ICD-10-CM

## 2018-01-28 MED ORDER — AMLODIPINE BESYLATE 5 MG PO TABS: 5.0000 mg | ORAL_TABLET | Freq: Two times a day (BID) | ORAL | Status: DC
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
## 2018-01-29 ENCOUNTER — Ambulatory Visit: Payer: Medicare Other | Admitting: Podiatry

## 2018-01-31 ENCOUNTER — Encounter: Payer: Self-pay | Admitting: Podiatry

## 2018-01-31 ENCOUNTER — Ambulatory Visit (INDEPENDENT_AMBULATORY_CARE_PROVIDER_SITE_OTHER): Payer: Medicare Other | Admitting: Podiatry

## 2018-01-31 DIAGNOSIS — J209 Acute bronchitis, unspecified: Secondary | ICD-10-CM | POA: Diagnosis not present

## 2018-01-31 DIAGNOSIS — M79676 Pain in unspecified toe(s): Secondary | ICD-10-CM

## 2018-01-31 DIAGNOSIS — M7671 Peroneal tendinitis, right leg: Secondary | ICD-10-CM | POA: Diagnosis not present

## 2018-01-31 DIAGNOSIS — B351 Tinea unguium: Secondary | ICD-10-CM | POA: Diagnosis not present

## 2018-01-31 DIAGNOSIS — J439 Emphysema, unspecified: Secondary | ICD-10-CM | POA: Diagnosis not present

## 2018-01-31 NOTE — Progress Notes (Signed)
This patient presents the office with chief complaint of painful right ankle for months.  This patient presents the office for evaluation and treatment and is a very poor historian.  She has previously been treated by myself for preventative foot care services.  She was also treated by Dr. Amalia Hailey for hammertoes.  She says that she has been treated by a doctor down at [facility].  She says that due to personal events she needed to return to [place] from [place].  She says that they had evaluated her foot and had planned on performing an MRI.  She says she has pain and swelling on the outside of her right ankle.  She says that the right foot/ankle is aggravated after a day's activity of walking.  She presents the office today for an evaluation and treatment of her right foot/ankle.  She states she is also presenting for preventative foot care services for her nails both feet.  General Appearance  Alert, conversant and in no acute stress.  Vascular  Dorsalis pedis and posterior tibial  pulses are palpable  bilaterally.  Capillary return is within normal limits  bilaterally. Temperature is within normal limits  bilaterally.  Neurologic  Senn-Weinstein monofilament wire test within normal limits  bilaterally. Muscle power within normal limits bilaterally.  Nails Thick disfigured discolored nails with subungual debris  from hallux to fifth toes bilaterally. No evidence of bacterial infection or drainage bilaterally.  Orthopedic  No limitations of motion  feet .  No crepitus or effusions noted.  Palpable pain noted at the insertion of the peroneal brevis of the fifth meta base.  Pain and swelling noted along the course of the peroneal tendon right foot/ankle. HAV  B/L and hammer toes  B/L.  Skin  normotropic skin with no porokeratosis noted bilaterally.  No signs of infections or ulcers noted.    Peroneal tendon Tendinitis right foot/ankle  ROV>  Debride nails  X 10.  Discussed painful right foot with this  patient.  X-rays that were taken by Dr. Amalia Hailey were examined and revealed no evidence of any bony pathology.  Therefore I proceeded to order an MRI for her to have taken locally.  In the meantime she is to walk with a cam walker to help to limit the her swelling and pain right foot/ankle.  RTC 3 months for preventative foot care services.  RTC 4 weeks for MRI discussion.   Gardiner Barefoot DPM

## 2018-02-01 ENCOUNTER — Other Ambulatory Visit: Payer: Self-pay

## 2018-02-01 ENCOUNTER — Telehealth: Payer: Self-pay

## 2018-02-01 DIAGNOSIS — Z5181 Encounter for therapeutic drug level monitoring: Secondary | ICD-10-CM

## 2018-02-01 DIAGNOSIS — G3184 Mild cognitive impairment, so stated: Secondary | ICD-10-CM

## 2018-02-01 DIAGNOSIS — F3164 Bipolar disorder, current episode mixed, severe, with psychotic features: Secondary | ICD-10-CM

## 2018-02-01 DIAGNOSIS — G5603 Carpal tunnel syndrome, bilateral upper limbs: Secondary | ICD-10-CM

## 2018-02-01 DIAGNOSIS — Z79899 Other long term (current) drug therapy: Secondary | ICD-10-CM

## 2018-02-01 DIAGNOSIS — F319 Bipolar disorder, unspecified: Secondary | ICD-10-CM

## 2018-02-01 NOTE — Telephone Encounter (Signed)
Pt has a boot and the her next ortho appt is not for another 3 weeks. Please advise.

## 2018-02-01 NOTE — Telephone Encounter (Signed)
Patient calling back regarding [place]. She states in addition she would like Education officer, museum to come by and possibly meals on wheels. She states she is unable to set these up herself. Please advise.

## 2018-02-01 NOTE — Telephone Encounter (Signed)
Angela [name] with [employer] called on behalf of Angela [name]. Pt stated to [place] that she is having difficulty bathing and dressing and that she is unable to prepare her meals. Stated she is unable to leave her home and is unable to pick up her medications and is having issues with her medications.   Pt states she is unable to stand for long periods of time due to her foot injury and feels as if a wheelchair may be necessary as she is having difficulty with her walker.  Pt would like a home health nurse and aide.  Please advise.

## 2018-02-01 NOTE — Telephone Encounter (Signed)
Spoke with Angela [name] and she is going to help get this figured out Monday upon her return.

## 2018-02-01 NOTE — Telephone Encounter (Signed)
Let's use polypharmacy and medication management for home health  Use carpal tunnel, hand arthritis, mild cognitive impairment, chronic bipolar affective d/o, bipolar I d/o for home health aide

## 2018-02-01 NOTE — Telephone Encounter (Signed)
Refer to [facility] or [facility]

## 2018-02-01 NOTE — Telephone Encounter (Signed)
We entered the referral, not sure what else we should do on our end. Please advise :)

## 2018-02-01 NOTE — Telephone Encounter (Signed)
They need to contact ORTHO for wheelchair order if they are already managing this

## 2018-02-01 NOTE — Telephone Encounter (Signed)
What is patients dx for home health and home health aid

## 2018-02-01 NOTE — Telephone Encounter (Signed)
Okay to order the home health aide Okay to order the home health nurse I will recommend that she see ORTHO right away for her foot and they can prescribe a boot or cast or whatever is needed for that; I cannot get approval for a wheelchair based on the information I have in the chart I don't think

## 2018-02-01 NOTE — Telephone Encounter (Signed)
Hey Portia! Are you able to help [place] with this? [facility]

## 2018-02-01 NOTE — Addendum Note (Signed)
Addended by: Graceann Congress D on: 02/01/2018 09:31 AM   Modules accepted: Orders

## 2018-02-01 NOTE — Telephone Encounter (Signed)
Home health order and aide placed.

## 2018-02-04 ENCOUNTER — Ambulatory Visit: Payer: Self-pay

## 2018-02-04 ENCOUNTER — Ambulatory Visit (INDEPENDENT_AMBULATORY_CARE_PROVIDER_SITE_OTHER): Payer: Medicare Other

## 2018-02-04 DIAGNOSIS — I10 Essential (primary) hypertension: Secondary | ICD-10-CM | POA: Diagnosis not present

## 2018-02-04 DIAGNOSIS — G3184 Mild cognitive impairment, so stated: Secondary | ICD-10-CM

## 2018-02-04 DIAGNOSIS — F3164 Bipolar disorder, current episode mixed, severe, with psychotic features: Secondary | ICD-10-CM | POA: Diagnosis not present

## 2018-02-04 DIAGNOSIS — E222 Syndrome of inappropriate secretion of antidiuretic hormone: Secondary | ICD-10-CM

## 2018-02-04 DIAGNOSIS — F319 Bipolar disorder, unspecified: Secondary | ICD-10-CM

## 2018-02-04 DIAGNOSIS — F4310 Post-traumatic stress disorder, unspecified: Secondary | ICD-10-CM

## 2018-02-04 DIAGNOSIS — E039 Hypothyroidism, unspecified: Secondary | ICD-10-CM

## 2018-02-04 DIAGNOSIS — G894 Chronic pain syndrome: Secondary | ICD-10-CM

## 2018-02-04 NOTE — Chronic Care Management (AMB) (Signed)
  Chronic Care Management   Note  02/04/2018 Name: Angela [name] MRN: [id number] DOB: 11-20-1950  Care Coordination: CCM RN CM received request from PCP staff to assist with social work referral and meals on wheels per patient request. Per documentation, patient has [facility] referral with [facility] and services were scheduled to begin yesterday 02/03/2018. CCM RN CM has HIPAA compliant VM message with [facility] requesting call back to discuss patient's desire for social work. Per [employer] website, medical social work is available.   Plan: Will follow up in 2-3 business days if no return call.    Fabrizio Filip E. Rollene Rotunda, RN, BSN Nurse Care Coordinator [facility] Care Management  [phone]

## 2018-02-04 NOTE — Patient Instructions (Addendum)
1. You have been given contact per your request for meals on wheels [phone] 2. You stated you were aware of free meals in [facility] 3. You also states you were aware of how to utilize [employer] for financial needs. 4. The CCM Team will arrange for Social Work Referral. CCM RN CM will follow up with you next week.  CCM (Chronic Care Management) Team   Trish Fountain RN, BSN Nurse Care Coordinator  [phone]  [name] PharmD  Clinical Pharmacist  [phone]  Goals Addressed    . "I need a Education officer, museum" (pt-stated)       Current Barriers:  Marland Kitchen Mental health  Nurse Case Manager Clinical Goal(s): Over the next 14 days, patient will report engaging with Clinical Social Work from [employer] or [employer]  Interventions:  . Contacted [facility] and left message requesting return call (see previous encounter) . Reviewed needs with patient . Provided patient with number to meals on wheels and [employer]   The patient verbalized understanding of instructions provided today and declined a print copy of patient instruction materials.

## 2018-02-04 NOTE — Chronic Care Management (AMB) (Addendum)
Chronic Care Management   Follow Up Note   02/04/2018 Name: Angela [name] MRN: [id number] DOB: Jan 15, 1951  Referred by: Arnetha Courser, MD Reason for referral : Chronic Care Management ([facility] referral)    Subjective: "I need a social worker to help me get disability and meals on wheels"   Objective:   Assessment: Angela [name] is a 68 year old patient of Dr. Enid Derry who was referred11/22/19to Chronic Care Management as a "stat referral" related to homelessness. She is well know to the CCM Team. Angela [name] was not follow up upon after last encounter with CCM RN CM secondary to her behavior over the phone (see note 12/18/2017..  Telephone encounter today related to [facility] referral for social work.  Angela [name] recently moved in to her own apartment in [place] and is requesting a "personal aide" and "disability". She would like to be referred to a Education officer, museum. She is also requesting financial assistance with rent and utilities.  Goals Addressed    . "I need a Education officer, museum" (pt-stated)       Angela [name] is excited about her new apartment. She is unable to put up her shower curtain and unpack fully related to her health. She says she needs a IT trainer" to drive her to appointments and to help her in her apartment. She is also requesting assistance with finances. CCM RN CM suggested [employer] however patient states she has called and no assistance available. She states "If I was on disability I could get assistance with a lot of things. Patient is unsure if she has Medicaid or not. "I don't have time to call all these places. I need you to do it for me".   Patient is requesting meals on wheels "emergently". Explained to patient meals on wheels is not available emergently and patient stated "that is not acceptable". Patient verbalizes awareness of resources in [facility] for food.   RN CM informed patient that referral to [facility] would be made if no  timely response from [facility].Patient states "well I talked with someone from [facility] last week and I was not impressed. She did not know any of the questions I was asking and I didn't have time to coach her". Patient is requesting Angela Riggers LCSW as she has had previous experience with them. Informed patient that Angela [name] was not ordered and that it is better for continuity of care to keep prescribed disciplines within the same community agency. Patient reluctantly agreed.  Current Barriers:  Marland Kitchen Mental health  Nurse Case Manager Clinical Goal(s): Over the next 14 days, patient will report engaging with Clinical Social Work from [employer] or [employer]  Interventions:  . Contacted [facility] and left message requesting return call (see previous encounter) . Reviewed needs with patient . Provided patient with number to meals on wheels and [employer]     Follow Up Plan: Telephone follow up next week. Will place referral to [facility] [place] if no response from [facility] (see previous encounter)  Addendum: Spoke with Dewana in [place] office. Services for Angela [name] began 02/03/2018 with initial visit/intake to be completed on Wednesday 02/06/2018 by Sharman Crate (nurse). CCM RN CM requested Social Work. Dewana took request and states she will call Sharman Crate today to make nurse aware. Sharman Crate will call PCP tomorrow to obtain order for Social Work. States patient will be notified tomorrow by Sharman Crate of Wednesdays appointment. Angela [name] updated by CCM RN CM by leaving detailed message at [phone] per  DPR.     Jazmene Racz E. Rollene Rotunda, RN, BSN Nurse Care Coordinator [facility] Care Management  [phone]

## 2018-02-05 ENCOUNTER — Telehealth: Payer: Self-pay

## 2018-02-05 ENCOUNTER — Ambulatory Visit: Payer: Self-pay | Admitting: Family Medicine

## 2018-02-05 NOTE — Telephone Encounter (Signed)
Left detailed voicemail

## 2018-02-05 NOTE — Telephone Encounter (Signed)
Copied from [place]. Topic: General - Other >> Feb 05, 2018 10:57 AM Yvette Rack wrote: Reason for CRM: pt wanting Dr Sanda Klein to call her stating that she is sick she's hurting all over she cant move due to her ankle pain she cancelled her appt for today

## 2018-02-05 NOTE — Telephone Encounter (Signed)
I'm going to encourage her to contact her doctor who is seeing her for her ankle pain (Dr. Prudence Davidson, I believe)

## 2018-02-05 NOTE — Telephone Encounter (Signed)
Copied from [place]. Topic: Referral - Status >> Feb 05, 2018 14:97 AM Simone Curia D wrote:  0/26/3785 Spoke with patient about community resources for food and transportation..Will route letter to Trish Fountain, RN to mail out. MA

## 2018-02-06 ENCOUNTER — Ambulatory Visit: Payer: Self-pay

## 2018-02-06 ENCOUNTER — Other Ambulatory Visit: Payer: Self-pay

## 2018-02-06 DIAGNOSIS — F4310 Post-traumatic stress disorder, unspecified: Secondary | ICD-10-CM

## 2018-02-06 DIAGNOSIS — G3184 Mild cognitive impairment, so stated: Secondary | ICD-10-CM

## 2018-02-06 DIAGNOSIS — F3164 Bipolar disorder, current episode mixed, severe, with psychotic features: Secondary | ICD-10-CM

## 2018-02-06 NOTE — Assessment & Plan Note (Signed)
Her personality disorders makes her interactions here and with other specialists and ancillary medical professionals difficult; she has burned bridges with other providers and resources unfortunately; she is strongly encouraged to keep her f/u with mental health providers

## 2018-02-06 NOTE — Chronic Care Management (AMB) (Signed)
  Chronic Care Management   Follow Up Note   02/06/2018 Name: Angela [name] MRN: [id number] DOB: 06/17/1950  Referred by: Arnetha Courser, MD Reason for referral : Chronic Care Management (follow up to social work/home health request)   Subjective: "Someone called last night at 7 wanting to come to my house today but that is not acceptable for them to call the night before. I am too busy to have an appointment like that this week" "I need social work now"   Objective:   Assessment:  AngelaAngela [name] is a 68 year old patient of Dr. Enid Derry who was referred11/22/19to Chronic Care Management as a "stat referral" related to homelessness. She is well know to the CCM Team. Angela [name] was not follow up upon after last encounter with CCM RN CM secondary to her behavior over the phone (see note 12/18/2017). Telephone encounter today to follow up on her incoming call to the practice yesterday requesting to speak to Case Manager.  Of note, received a call from Angela Arms, RN with [facility] (health coach) earlier this morning regarding patient. Patient had called [name] (she was calling random numbers she was given in the past) and discussed all her needs with [name]. [name] updated CCM RN CM of patient's request. Those request have been previously discussed with patient by PCP and CCM RN CM and appropriate referrals have been placed.    Goals Addressed    . COMPLETED: "I need a Education officer, museum" (pt-stated)       Angela [name] states she has been contacted by [facility] RN who wanted to complete an intake/needs assessment (which will include assessment for social work)today in patients home. Patient states she has too many appointments and things to do this week to accommodate. She is awaiting call back to reschedule. Angela [name] was very demanding on the phone stating"Do you have the number to give me for [facility], yes or no" "It is unacceptable for them to call to schedule at 7pm  at night". Angela [name] was very curt with CCM RN CM and stated "I don't have time to talk to you because they are here fixing my Internet". Angela [name] does not want to engage with CCM RN CM unless her needs can be met "now" such as meals on wheels, social work, personal attendant, and disability.  Current Barriers:  Marland Kitchen Mental health  Nurse Case Manager Clinical Goal(s): Over the next 14 days, patient will report engaging with Clinical Social Work from [employer] or [employer]  Interventions:  . Contacted [facility] and left message requesting return call (see previous encounter) . Reviewed needs with patient . Provided patient with number to meals on wheels and [employer]  02/06/2018 Confirmed with Angela [name] referral intake specialist [facility] office) that patient was contacted and originally scheduled for intake assessment today 02/06/2018. Angela [name] provided RN CM with Clinical Manager Angela Fuller, RN contact. CCM RN CM will follow up with Angela [name] tomorrow to discuss status of intake.     Telephone follow up appointment with CCM team member scheduled for: 02/12/2018 as previously scheduled.     Angela [name] E. Rollene Rotunda, RN, BSN Nurse Care Coordinator [facility] / [facility] Care Management  [phone]

## 2018-02-06 NOTE — Patient Outreach (Signed)
[facility]) Care Management  02/06/2018  Angela [name] 11/02/1950 [id number]    [place] received voicemail yesterday afternoon requesting services.  After reviewing the chart the patient has a Chronic Case manager Trish Fountain RN working with her.  I called and informed Truddie Crumble that the patient called and the information she left.  Plan:  Trish Fountain RN Chronic Case Manager will continue to work with the patient for assistance.  [name] RN, BSN, [place] Direct Dial:  [phone]  Fax: [phone]

## 2018-02-07 ENCOUNTER — Ambulatory Visit: Payer: Medicare Other

## 2018-02-07 DIAGNOSIS — M21612 Bunion of left foot: Secondary | ICD-10-CM | POA: Diagnosis not present

## 2018-02-07 DIAGNOSIS — R2681 Unsteadiness on feet: Secondary | ICD-10-CM | POA: Diagnosis not present

## 2018-02-07 DIAGNOSIS — M21611 Bunion of right foot: Secondary | ICD-10-CM | POA: Diagnosis not present

## 2018-02-07 DIAGNOSIS — M25373 Other instability, unspecified ankle: Secondary | ICD-10-CM | POA: Diagnosis not present

## 2018-02-07 DIAGNOSIS — M2042 Other hammer toe(s) (acquired), left foot: Secondary | ICD-10-CM | POA: Diagnosis not present

## 2018-02-07 DIAGNOSIS — M2041 Other hammer toe(s) (acquired), right foot: Secondary | ICD-10-CM | POA: Diagnosis not present

## 2018-02-07 DIAGNOSIS — M2011 Hallux valgus (acquired), right foot: Secondary | ICD-10-CM | POA: Diagnosis not present

## 2018-02-07 DIAGNOSIS — M2012 Hallux valgus (acquired), left foot: Secondary | ICD-10-CM | POA: Diagnosis not present

## 2018-02-08 ENCOUNTER — Ambulatory Visit: Payer: Self-pay | Admitting: Family Medicine

## 2018-02-08 ENCOUNTER — Ambulatory Visit (INDEPENDENT_AMBULATORY_CARE_PROVIDER_SITE_OTHER): Payer: Medicare Other | Admitting: Family Medicine

## 2018-02-08 ENCOUNTER — Ambulatory Visit (HOSPITAL_COMMUNITY): Admission: RE | Admit: 2018-02-08 | Payer: Medicare Other | Source: Ambulatory Visit

## 2018-02-08 ENCOUNTER — Telehealth: Payer: Self-pay | Admitting: Family Medicine

## 2018-02-08 ENCOUNTER — Encounter: Payer: Self-pay | Admitting: Family Medicine

## 2018-02-08 ENCOUNTER — Ambulatory Visit (INDEPENDENT_AMBULATORY_CARE_PROVIDER_SITE_OTHER): Payer: Medicare Other

## 2018-02-08 VITALS — BP 122/82 | HR 61 | Temp 98.7°F | Resp 16 | Ht 63.0 in | Wt 130.0 lb

## 2018-02-08 VITALS — BP 122/82 | HR 61 | Temp 98.7°F | Resp 16 | Ht 63.0 in | Wt 130.3 lb

## 2018-02-08 DIAGNOSIS — R079 Chest pain, unspecified: Secondary | ICD-10-CM

## 2018-02-08 DIAGNOSIS — Z0000 Encounter for general adult medical examination without abnormal findings: Secondary | ICD-10-CM

## 2018-02-08 DIAGNOSIS — N644 Mastodynia: Secondary | ICD-10-CM | POA: Diagnosis not present

## 2018-02-08 DIAGNOSIS — Z780 Asymptomatic menopausal state: Secondary | ICD-10-CM

## 2018-02-08 DIAGNOSIS — M25373 Other instability, unspecified ankle: Secondary | ICD-10-CM

## 2018-02-08 DIAGNOSIS — F3341 Major depressive disorder, recurrent, in partial remission: Secondary | ICD-10-CM

## 2018-02-08 DIAGNOSIS — R69 Illness, unspecified: Secondary | ICD-10-CM

## 2018-02-08 DIAGNOSIS — K449 Diaphragmatic hernia without obstruction or gangrene: Secondary | ICD-10-CM

## 2018-02-08 DIAGNOSIS — R630 Anorexia: Secondary | ICD-10-CM

## 2018-02-08 DIAGNOSIS — F609 Personality disorder, unspecified: Secondary | ICD-10-CM

## 2018-02-08 DIAGNOSIS — F489 Nonpsychotic mental disorder, unspecified: Secondary | ICD-10-CM

## 2018-02-08 DIAGNOSIS — Z91199 Patient's noncompliance with other medical treatment and regimen due to unspecified reason: Secondary | ICD-10-CM

## 2018-02-08 DIAGNOSIS — Z9119 Patient's noncompliance with other medical treatment and regimen: Secondary | ICD-10-CM

## 2018-02-08 NOTE — Telephone Encounter (Signed)
Copied from [place] [id number]. Topic: Quick Communication - Home Health Verbal Orders >> Feb 08, 2018 10:27 AM Alanda Slim E wrote: Caller/Agency: [facility] Number: [phone] Skilled Nursing    Pt was ordered to have skilled Nursing go in and evaluate for services / they were not able to get in touch with Pt and went by the Pt house today but no one came to the door. They have had no success contacting Pt for services
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
## 2018-02-08 NOTE — Progress Notes (Signed)
Subjective:   Angela [name] is a 68 y.o. female who presents for an Initial Medicare Annual Wellness Visit.  Review of Systems      Cardiac Risk Factors include: advanced age (>8men, >12 women)     Objective:    Today's Vitals   02/08/18 1344 02/08/18 1345  BP: 122/82   Pulse: 61   Resp: 16   Temp: 98.7 F (37.1 C)   TempSrc: Oral   SpO2: 97%   Weight: 130 lb 4.8 oz (59.1 kg)   Height: 5\' 3"  (1.6 m)   PainSc:  8    Body mass index is 23.08 kg/m.  Advanced Directives 02/08/2018 01/25/2018 12/24/2017 11/12/2017 09/12/2017 09/12/2017 08/17/2017  Does Patient Have a Medical Advance Directive? No No No No No No No  Would patient like information on creating a medical advance directive? No - Patient declined Yes (ED - Information included in AVS) - No - Patient declined No - Patient declined No - Patient declined No - Patient declined  Some encounter information is confidential and restricted. Go to Review Flowsheets activity to see all data.    Current Medications (verified) Outpatient Encounter Medications as of 02/08/2018  Medication Sig  . acetaminophen (TYLENOL) 650 MG CR tablet Take 650 mg by mouth every 8 (eight) hours as needed for pain.  Marland Kitchen amLODipine (NORVASC) 5 MG tablet Take 1 tablet (5 mg total) by mouth 2 (two) times daily.  . Biotin 10000 MCG TABS Take by mouth.  . Cholecalciferol 25 MCG (1000 UT) tablet Take 1 tablet (1,000 Units total) by mouth daily.  . Cyanocobalamin (VITAMIN B-12) 1000 MCG SUBL Place under the tongue.  . cycloSPORINE (RESTASIS) 0.05 % ophthalmic emulsion Place 1 drop into both eyes 2 (two) times daily.  . diclofenac sodium (VOLTAREN) 1 % GEL   . dicyclomine (BENTYL) 10 MG capsule Take 10 mg by mouth as needed for spasms.  . ferrous sulfate 325 (65 FE) MG tablet Take 325 mg by mouth daily with breakfast.  . gabapentin (NEURONTIN) 100 MG capsule Take 100 mg by mouth 2 (two) times daily.  . Glucosamine-Chondroitin (GLUCOSAMINE CHONDR  COMPLEX PO) Take by mouth.  Marland Kitchen guaiFENesin (MUCINEX) 600 MG 12 hr tablet Take by mouth 2 (two) times daily.  . hydrOXYzine (ATARAX/VISTARIL) 10 MG tablet TAKE 1 TO 2 TABLETS BY MOUTH ONCE DAILY AS NEEDED  . Icosapent Ethyl (VASCEPA) 1 g CAPS Two capsules by mouth twice a day (to help with triglycerides)  . ipratropium (ATROVENT) 0.03 % nasal spray Place 2 sprays into both nostrils every 12 (twelve) hours.  Marland Kitchen levothyroxine (SYNTHROID, LEVOTHROID) 100 MCG tablet Take 1 tablet (100 mcg total) by mouth daily at 6 (six) AM.  . lidocaine (LIDODERM) 5 % Place 1 patch onto the skin daily. Remove & Discard patch within 12 hours or as directed by MD  . linaclotide (LINZESS) 290 MCG CAPS capsule Take 1 capsule (290 mcg total) by mouth daily before breakfast.  . LORazepam (ATIVAN) 0.5 MG tablet TAKE 1 TABLET BY MOUTH AS NEEDED ONCE DAILY  . magnesium oxide (MAG-OX) 400 (241.3 Mg) MG tablet Take 0.5 tablets (200 mg total) by mouth 2 (two) times daily.  . Multiple Vitamin (MULTIVITAMIN WITH MINERALS) TABS tablet Take 1 tablet by mouth daily.  Marland Kitchen PARoxetine (PAXIL-CR) 25 MG 24 hr tablet Take 1 tablet (25 mg total) by mouth daily.  . polyethylene glycol (MIRALAX / GLYCOLAX) packet Take 17 g by mouth daily.  . polyvinyl alcohol (LIQUIFILM TEARS) 1.4 %  ophthalmic solution Place 1 drop into both eyes as needed for dry eyes.  Marland Kitchen PREMARIN vaginal cream   . promethazine (PHENERGAN) 12.5 MG tablet Take by mouth.  . pyridoxine (B-6) 100 MG tablet Take 1 tablet (100 mg total) by mouth 2 (two) times daily.  . QUEtiapine (SEROQUEL) 25 MG tablet Take 1 tablet (25 mg total) by mouth at bedtime. (Patient taking differently: Take 25 mg by mouth at bedtime. Pt states she cuts tablet in 1/4 to take PRN)  . simethicone (MYLICON) 80 MG chewable tablet Chew by mouth.  Marland Kitchen tiZANidine (ZANAFLEX) 2 MG tablet Take by mouth every 6 (six) hours as needed for muscle spasms.  . traZODone (DESYREL) 50 MG tablet Pt takes 1/2 tablet qhs  .  albuterol (PROVENTIL HFA;VENTOLIN HFA) 108 (90 Base) MCG/ACT inhaler Inhale 2 puffs into the lungs every 6 (six) hours as needed for wheezing or shortness of breath. (Patient not taking: Reported on 02/08/2018)  . levalbuterol (XOPENEX) 1.25 MG/3ML nebulizer solution   . [DISCONTINUED] fluticasone-salmeterol (ADVAIR HFA) 115-21 MCG/ACT inhaler Inhale into the lungs.  . [DISCONTINUED] levalbuterol (XOPENEX) 1.25 MG/0.5ML nebulizer solution Inhale into the lungs.  . [DISCONTINUED] prazosin (MINIPRESS) 2 MG capsule TAKE 1 CAPSULE BY MOUTH AT BEDTIME  . [DISCONTINUED] promethazine (PHENERGAN) 25 MG tablet Take 25 mg by mouth every 6 (six) hours as needed for nausea or vomiting. Half a pill  . [DISCONTINUED] traZODone (DESYREL) 100 MG tablet Take 0.5 tablets (50 mg total) by mouth at bedtime.   No facility-administered encounter medications on file as of 02/08/2018.     Allergies (verified) Advair diskus [fluticasone-salmeterol]; Bee venom; Darvon [propoxyphene]; Dicyclomine; Other; Oxycodone-acetaminophen; Peanuts [peanut oil]; Penicillins; Sulfa antibiotics; Sulfacetamide sodium; Ultram [tramadol]; Amikacin; Aspirin; Doxycycline; Haloperidol; Hymenoptera venom preparations; Imipramine; Keflex [cephalexin]; Lactose intolerance (gi); Risperdal [risperidone]; Sulfur; Adhesive [tape]; Albuterol; Ciprofloxacin; Hydroxyzine; Peanut butter flavor; and Prednisone   History: Past Medical History:  Diagnosis Date  . Allergic rhinitis   . Anemia   . Blurred vision   . Depression   . Diverticulosis   . Endometriosis   . Falls   . GERD (gastroesophageal reflux disease)   . Hernia, hiatal   . Hypothyroidism   . IBS (irritable bowel syndrome)   . Malignant neoplasm of skin   . Migraine   . Neuropathy   . PTSD (post-traumatic stress disorder)   . Thyroid disease    Past Surgical History:  Procedure Laterality Date  . APPENDECTOMY    . CHOLECYSTECTOMY    . CHOLECYSTECTOMY, LAPAROSCOPIC    .  ESOPHAGOGASTRODUODENOSCOPY (EGD) WITH PROPOFOL N/A 03/29/2015   Procedure: ESOPHAGOGASTRODUODENOSCOPY (EGD) WITH PROPOFOL;  Surgeon: Josefine Class, MD;  Location: [facility] ENDOSCOPY;  Service: Endoscopy;  Laterality: N/A;  . HERNIA REPAIR    . laproscopy    . TONSILLECTOMY    . uteral suspension     Family History  Problem Relation Age of Onset  . Heart disease Father   . Hearing loss Father   . COPD Father   . Depression Father   . Stroke Father   . Vision loss Father   . Varicose Veins Father   . Cancer Mother        lung  . Depression Sister   . Arthritis Sister   . Arthritis Brother   . Hernia Brother   . Anxiety disorder Brother   . Arthritis Brother   . Hernia Brother   . Anxiety disorder Brother   . Urolithiasis Neg Hx   . Kidney  disease Neg Hx   . Kidney cancer Neg Hx   . Prostate cancer Neg Hx    Social History   Socioeconomic History  . Marital status: Single    Spouse name: Not on file  . Number of children: 0  . Years of education: 1.5 years of college  . Highest education level: Some college, no degree  Occupational History  . Occupation: Retired  Scientific laboratory technician  . Financial resource strain: Very hard  . Food insecurity:    Worry: Often true    Inability: Often true  . Transportation needs:    Medical: Yes    Non-medical: Yes  Tobacco Use  . Smoking status: Former Smoker    Last attempt to quit: 11/17/1976    Years since quitting: 41.2  . Smokeless tobacco: Never Used  Substance and Sexual Activity  . Alcohol use: No  . Drug use: No  . Sexual activity: Not Currently  Lifestyle  . Physical activity:    Days per week: 0 days    Minutes per session: 0 min  . Stress: Very much  Relationships  . Social connections:    Talks on phone: Never    Gets together: Never    Attends religious service: More than 4 times per year    Active member of club or organization: No    Attends meetings of clubs or organizations: Never    Relationship status:  Not on file  Other Topics Concern  . Not on file  Social History Narrative   Lives at home alone.   Right-handed.   No daily caffeine use.    Tobacco Counseling Counseling given: Not Answered   Clinical Intake:  Pre-visit preparation completed: Yes  Pain : 0-10 Pain Score: 8  Pain Type: Chronic pain Pain Location: Back(ankle, fingers, wrist) Pain Descriptors / Indicators: Aching Pain Onset: More than a month ago Pain Frequency: Constant     Nutritional Status: BMI of 19-24  Normal Nutritional Risks: (diarrhea intermittent past 2 weeks, IBS) Diabetes: No  How often do you need to have someone help you when you read instructions, pamphlets, or other written materials from your doctor or pharmacy?: 1 - Never What is the last grade level you completed in school?: some college  Interpreter Needed?: No  Information entered by :: Clemetine Marker LPN   Activities of Daily Living In your present state of health, do you have any difficulty performing the following activities: 02/08/2018 01/28/2018  Hearing? Y Y  Comment no hearing aids -  Vision? Y Y  Comment lost glasses -  Difficulty concentrating or making decisions? Y N  Walking or climbing stairs? Y Y  Dressing or bathing? Tempie Donning  Comment needs assistance showering -  Doing errands, shopping? N Y  Comment uses Administrator, arts and eating ? N -  Using the Toilet? N -  In the past six months, have you accidently leaked urine? Y -  Comment wears depends -  Do you have problems with loss of bowel control? Y -  Comment wears depends -  Managing your Medications? N -  Managing your Finances? N -  Housekeeping or managing your Housekeeping? N -  Some encounter information is confidential and restricted. Go to Review Flowsheets activity to see all data.  Some recent data might be hidden     Immunizations and Health Maintenance Immunization History  Administered Date(s) Administered  . PPD Test  01/27/2014, 03/03/2014, 11/01/2017   Health Maintenance Due  Topic Date Due  . MAMMOGRAM  03/09/1966  . COLONOSCOPY  03/09/1998    Patient Care Team: Arnetha Courser, MD as PCP - General (Family Medicine) Hetty Blend Alfredo Bach, MD as Referring Physician (Orthopedic Surgery) Patient, No Pcp Per (General Practice) Benedetto Goad, RN as Case Manager Cathi Roan, [facility] (Pharmacist)  Indicate any recent Medical Services you may have received from other than [facility] providers in the past year (date may be approximate).     Assessment:   This is a routine wellness examination for Angela [name].  Hearing/Vision screen Hearing Screening Comments: Pt states she is deaf in right ear  Vision Screening Comments: Pt c/o poor vision, dry eyes and lost glasses. Recently [facility] by [facility]  Dietary issues and exercise activities discussed: Current Exercise Habits: The patient does not participate in regular exercise at present, Exercise limited by: cardiac condition(s);orthopedic condition(s)  Goals    . Patient Stated     Patient would like to be more active and overall feel better about her body and strength. She also wants to regulate a better sleep cycle.       Depression Screen PHQ 2/9 Scores 02/08/2018 01/28/2018 12/07/2017 11/17/2016 11/17/2016  PHQ - 2 Score 1 1 6 6 6   PHQ- 9 Score 14 9 21 21 21     Fall Risk Fall Risk  02/08/2018 01/28/2018 12/07/2017 11/17/2016  Falls in the past year? 1 1 1  Yes  Number falls in past yr: 1 1 1  -  Injury with Fall? 1 1 1  -  Risk for fall due to : - - - History of fall(s)  Follow up Falls prevention discussed - - -   FALL RISK PREVENTION PERTAINING TO THE HOME:  Any stairs in or around the home WITH handrails? No  Home free of loose throw rugs in walkways, pet beds, electrical cords, etc? Yes  Adequate lighting in your home to reduce risk of falls? Yes   ASSISTIVE DEVICES UTILIZED TO PREVENT FALLS:  Life alert? No  Use of a  cane, walker or w/c? Yes  Grab bars in the bathroom? No  Shower chair or bench in shower? Yes  Elevated toilet seat or a handicapped toilet? No   DME ORDERS:  DME order needed?  No   TIMED UP AND GO:  Was the test performed? Yes .  Length of time to ambulate 10 feet: 10 sec.   GAIT:  Appearance of gait: Gait slow, steady and with the use of an assistive device.  Education: Fall risk prevention has been discussed.  Intervention(s) required? No   Cognitive Function:      6CIT Screen 02/08/2018  What Year? 0 points  What month? 0 points  What time? 0 points  Count back from 20 0 points  Months in reverse 0 points  Repeat phrase 0 points  Total Score 0    Screening Tests Health Maintenance  Topic Date Due  . MAMMOGRAM  03/09/1966  . COLONOSCOPY  03/09/1998  . INFLUENZA VACCINE  04/15/2018 (Originally 08/16/2017)  . TETANUS/TDAP  12/08/2018 (Originally 03[birth year]10/1967)  . Hepatitis C Screening  12/08/2018 (Originally October 27, 1950)  . PNA vac Low Risk Adult (1 of 2 - PCV13) 12/08/2018 (Originally 03/09/2013)  . DEXA SCAN  Completed    Qualifies for Shingles Vaccine? Yes . Due for Shingrix. Education has been provided regarding the importance of this vaccine. Pt has been advised to call insurance company to determine out of pocket expense. Advised may  also receive vaccine at local pharmacy or Health Dept. Verbalized acceptance and understanding.  Tdap: Although this vaccine is not a covered service during a Wellness Exam, does the patient still wish to receive this vaccine today?  No .  Education has been provided regarding the importance of this vaccine. Advised may receive this vaccine at local pharmacy or Health Dept. Aware to provide a copy of the vaccination record if obtained from local pharmacy or Health Dept. Verbalized acceptance and understanding.  Flu Vaccine: Due for Flu vaccine. Does the patient want to receive this vaccine today?  No . Education has been provided regarding  the importance of this vaccine but still declined. Advised may receive this vaccine at local pharmacy or Health Dept. Aware to provide a copy of the vaccination record if obtained from local pharmacy or Health Dept. Verbalized acceptance and understanding.  Pneumococcal Vaccine: Due for Pneumococcal vaccine. Does the patient want to receive this vaccine today?  No . Education has been provided regarding the importance of this vaccine but still declined. Advised may receive this vaccine at local pharmacy or Health Dept. Aware to provide a copy of the vaccination record if obtained from local pharmacy or Health Dept. Verbalized acceptance and understanding.  Cancer Screenings:  Colorectal Screening: No colonoscopy record on file. Referral sent to GI. She would like to go to Dr. Allen Norris  Mammogram: Completed 2014. Ordered 08/23/17. Pt provided with contact information and advised to call to schedule appt.   Bone Density: Completed 2015. Results reflect OSTEOPENIA. Repeat every 2 years. Ordered today. Pt provided with contact information and advised to call to schedule appt.   Lung Cancer Screening: (Low Dose CT Chest recommended if Age 48-80 years, 30 pack-year currently smoking OR have quit w/in 15years.) does not qualify.    Additional Screening:  Hepatitis C Screening:post poned  Vision Screening: Recommended annual ophthalmology exams for early detection of glaucoma and other disorders of the eye. Is the patient up to date with their annual eye exam?  Yes  Who is the provider or what is the name of the office in which the pt attends annual eye exams? Dr. Edison Pace [facility]  Dental Screening: Recommended annual dental exams for proper oral hygiene  Community Resource Referral:  CRR required this visit?  No      Plan:    I have personally reviewed and addressed the Medicare Annual Wellness questionnaire and have noted the following in the patient's chart:  A. Medical and social  history B. Use of alcohol, tobacco or illicit drugs  C. Current medications and supplements D. Functional ability and status E.  Nutritional status F.  Physical activity G. Advance directives H. List of other physicians I.  Hospitalizations, surgeries, and ER visits in previous 12 months J.  [place] such as hearing and vision if needed, cognitive and depression L. Referrals and appointments   In addition, I have reviewed and discussed with patient certain preventive protocols, quality metrics, and best practice recommendations. A written personalized care plan for preventive services as well as general preventive health recommendations were provided to patient.   Signed,  Clemetine Marker, LPN Nurse Health Advisor   Nurse Notes: One hour spent with patient today with at least 20 minutes reviewing medications. Patient is currently seeing multiple specialists and has recently been involved with care management. She has moved into her apartment in the last 2 weeks but c/o transportation issues and multiple medical appts. Pt also seeing Dr. Sanda Klein  today.

## 2018-02-08 NOTE — Assessment & Plan Note (Signed)
USPSTF grade A and B recommendations reviewed with patient; age-appropriate recommendations, preventive care, screening tests, etc discussed and encouraged; healthy living encouraged; see AVS for patient education given to patient  

## 2018-02-08 NOTE — Patient Instructions (Addendum)
[place] has tried to contact you to schedule a visit with your for your chest pain Please call the cardiologist today and schedule your appointment If you believe you are having a heart attack or have chest pain or other worrisome symptoms, call [phone]  Please call Dr. Aubery Lapping for regular skin exams if you have a history of melanoma  Please do call to schedule your bone density study; the number to schedule one at either [facility] or [facility] is [phone] or [phone]  Take 1,000 to 2,000 iu vitamin D3 daily; do not take more than 2,000 iu daily   Fall Prevention in the Home, Adult Falls can cause injuries and can affect people from all age groups. There are many simple things that you can do to make your home safe and to help prevent falls. Ask for help when making these changes, if needed. What actions can I take to prevent falls? General instructions  Use good lighting in all rooms. Replace any light bulbs that burn out.  Turn on lights if it is dark. Use night-lights.  Place frequently used items in easy-to-reach places. Lower the shelves around your home if necessary.  Set up furniture so that there are clear paths around it. Avoid moving your furniture around.  Remove throw rugs and other tripping hazards from the floor.  Avoid walking on wet floors.  Fix any uneven floor surfaces.  Add color or contrast paint or tape to grab bars and handrails in your home. Place contrasting color strips on the first and last steps of stairways.  When you use a stepladder, make sure that it is completely opened and that the sides are firmly locked. Have someone hold the ladder while you are using it. Do not climb a closed stepladder.  Be aware of any and all pets. What can I do in the bathroom?      Keep the floor dry. Immediately clean up any water that spills onto the floor.  Remove soap buildup in the tub or shower on a  regular basis.  Use non-skid mats or decals on the floor of the tub or shower.  Attach bath mats securely with double-sided, non-slip rug tape.  If you need to sit down while you are in the shower, use a plastic, non-slip stool.  Install grab bars by the toilet and in the tub and shower. Do not use towel bars as grab bars. What can I do in the bedroom?  Make sure that a bedside light is easy to reach.  Do not use oversized bedding that drapes onto the floor.  Have a firm chair that has side arms to use for getting dressed. What can I do in the kitchen?  Clean up any spills right away.  If you need to reach for something above you, use a sturdy step stool that has a grab bar.  Keep electrical cables out of the way.  Do not use floor polish or wax that makes floors slippery. If you must use wax, make sure that it is non-skid floor wax. What can I do in the stairways?  Do not leave any items on the stairs.  Make sure that you have a light switch at the top of the stairs and the bottom of the stairs. Have them installed if you do not have them.  Make sure that there are handrails on both sides of the stairs. Fix handrails that are broken or loose. Make sure  that handrails are as long as the stairways.  Install non-slip stair treads on all stairs in your home.  Avoid having throw rugs at the top or bottom of stairways, or secure the rugs with carpet tape to prevent them from moving.  Choose a carpet design that does not hide the edge of steps on the stairway.  Check any carpeting to make sure that it is firmly attached to the stairs. Fix any carpet that is loose or worn. What can I do on the outside of my home?  Use bright outdoor lighting.  Regularly repair the edges of walkways and driveways and fix any cracks.  Remove high doorway thresholds.  Trim any shrubbery on the main path into your home.  Regularly check that handrails are securely fastened and in good repair.  Both sides of any steps should have handrails.  Install guardrails along the edges of any raised decks or porches.  Clear walkways of debris and clutter, including tools and rocks.  Have leaves, snow, and ice cleared regularly.  Use sand or salt on walkways during winter months.  In the garage, clean up any spills right away, including grease or oil spills. What other actions can I take?  Wear closed-toe shoes that fit well and support your feet. Wear shoes that have rubber soles or low heels.  Use mobility aids as needed, such as canes, walkers, scooters, and crutches.  Review your medicines with your health care provider. Some medicines can cause dizziness or changes in blood pressure, which increase your risk of falling. Talk with your health care provider about other ways that you can decrease your risk of falls. This may include working with a physical therapist or trainer to improve your strength, balance, and endurance. Where to find more information  Centers for Disease Control and Prevention, STEADI: WebmailGuide.co.za  [employer] on Aging: BrainJudge.co.uk Contact a health care provider if:  You are afraid of falling at home.  You feel weak, drowsy, or dizzy at home.  You fall at home. Summary  There are many simple things that you can do to make your home safe and to help prevent falls.  Ways to make your home safe include removing tripping hazards and installing grab bars in the bathroom.  Ask for help when making these changes in your home. This information is not intended to replace advice given to you by your health care provider. Make sure you discuss any questions you have with your health care provider. Document Released: 12/23/2001 Document Revised: 08/17/2016 Document Reviewed: 08/17/2016 Elsevier Interactive Patient Education  2019 [employer].  Consider getting the new shingles vaccine called Shingrix; that is available for  individuals 70 years of age and older, and is recommended even if you have had shingles in the past and/or already received the old shingles vaccine (Zostavax); it is a two-part series, and is available at many local pharmacies  We'll have you see a nutritionist  Health Maintenance, Female Adopting a healthy lifestyle and getting preventive care can go a long way to promote health and wellness. Talk with your health care provider about what schedule of regular examinations is right for you. This is a good chance for you to check in with your provider about disease prevention and staying healthy. In between checkups, there are plenty of things you can do on your own. Experts have done a lot of research about which lifestyle changes and preventive measures are most likely to keep you healthy. Ask your health  care provider for more information. Weight and diet Eat a healthy diet  Be sure to include plenty of vegetables, fruits, low-fat dairy products, and lean protein.  Do not eat a lot of foods high in solid fats, added sugars, or salt.  Get regular exercise. This is one of the most important things you can do for your health. ? Most adults should exercise for at least 150 minutes each week. The exercise should increase your heart rate and make you sweat (moderate-intensity exercise). ? Most adults should also do strengthening exercises at least twice a week. This is in addition to the moderate-intensity exercise. Maintain a healthy weight  Body mass index (BMI) is a measurement that can be used to identify possible weight problems. It estimates body fat based on height and weight. Your health care provider can help determine your BMI and help you achieve or maintain a healthy weight.  For females 84 years of age and older: ? A BMI below 18.5 is considered underweight. ? A BMI of 18.5 to 24.9 is normal. ? A BMI of 25 to 29.9 is considered overweight. ? A BMI of 30 and above is considered  obese. Watch levels of cholesterol and blood lipids  You should start having your blood tested for lipids and cholesterol at 71 years of age, then have this test every 5 years.  You may need to have your cholesterol levels checked more often if: ? Your lipid or cholesterol levels are high. ? You are older than 70 years of age. ? You are at high risk for heart disease. Cancer screening Lung Cancer  Lung cancer screening is recommended for adults 20-15 years old who are at high risk for lung cancer because of a history of smoking.  A yearly low-dose CT scan of the lungs is recommended for people who: ? Currently smoke. ? Have quit within the past 15 years. ? Have at least a 30-pack-year history of smoking. A pack year is smoking an average of one pack of cigarettes a day for 1 year.  Yearly screening should continue until it has been 15 years since you quit.  Yearly screening should stop if you develop a health problem that would prevent you from having lung cancer treatment. Breast Cancer  Practice breast self-awareness. This means understanding how your breasts normally appear and feel.  It also means doing regular breast self-exams. Let your health care provider know about any changes, no matter how small.  If you are in your 20s or 30s, you should have a clinical breast exam (CBE) by a health care provider every 1-3 years as part of a regular health exam.  If you are 39 or older, have a CBE every year. Also consider having a breast X-ray (mammogram) every year.  If you have a family history of breast cancer, talk to your health care provider about genetic screening.  If you are at high risk for breast cancer, talk to your health care provider about having an MRI and a mammogram every year.  Breast cancer gene (BRCA) assessment is recommended for women who have family members with BRCA-related cancers. BRCA-related cancers include: ? Breast. ? Ovarian. ? Tubal. ? Peritoneal  cancers.  Results of the assessment will determine the need for genetic counseling and BRCA1 and BRCA2 testing. Cervical Cancer Your health care provider may recommend that you be screened regularly for cancer of the pelvic organs (ovaries, uterus, and vagina). This screening involves a pelvic examination, including checking for microscopic  changes to the surface of your cervix (Pap test). You may be encouraged to have this screening done every 3 years, beginning at age 54.  For women ages 66-65, health care providers may recommend pelvic exams and Pap testing every 3 years, or they may recommend the Pap and pelvic exam, combined with testing for human papilloma virus (HPV), every 5 years. Some types of HPV increase your risk of cervical cancer. Testing for HPV may also be done on women of any age with unclear Pap test results.  Other health care providers may not recommend any screening for nonpregnant women who are considered low risk for pelvic cancer and who do not have symptoms. Ask your health care provider if a screening pelvic exam is right for you.  If you have had past treatment for cervical cancer or a condition that could lead to cancer, you need Pap tests and screening for cancer for at least 20 years after your treatment. If Pap tests have been discontinued, your risk factors (such as having a new sexual partner) need to be reassessed to determine if screening should resume. Some women have medical problems that increase the chance of getting cervical cancer. In these cases, your health care provider may recommend more frequent screening and Pap tests. Colorectal Cancer  This type of cancer can be detected and often prevented.  Routine colorectal cancer screening usually begins at 70 years of age and continues through 70 years of age.  Your health care provider may recommend screening at an earlier age if you have risk factors for colon cancer.  Your health care provider may also  recommend using home test kits to check for hidden blood in the stool.  A small camera at the end of a tube can be used to examine your colon directly (sigmoidoscopy or colonoscopy). This is done to check for the earliest forms of colorectal cancer.  Routine screening usually begins at age 20.  Direct examination of the colon should be repeated every 5-10 years through 70 years of age. However, you may need to be screened more often if early forms of precancerous polyps or small growths are found. Skin Cancer  Check your skin from head to toe regularly.  Tell your health care provider about any new moles or changes in moles, especially if there is a change in a mole's shape or color.  Also tell your health care provider if you have a mole that is larger than the size of a pencil eraser.  Always use sunscreen. Apply sunscreen liberally and repeatedly throughout the day.  Protect yourself by wearing long sleeves, pants, a wide-brimmed hat, and sunglasses whenever you are outside. Heart disease, diabetes, and high blood pressure  High blood pressure causes heart disease and increases the risk of stroke. High blood pressure is more likely to develop in: ? People who have blood pressure in the high end of the normal range (130-139/85-89 mm Hg). ? People who are overweight or obese. ? People who are African American.  If you are 49-36 years of age, have your blood pressure checked every 3-5 years. If you are 50 years of age or older, have your blood pressure checked every year. You should have your blood pressure measured twice-once when you are at a hospital or clinic, and once when you are not at a hospital or clinic. Record the average of the two measurements. To check your blood pressure when you are not at a hospital or clinic, you can use: ?  An automated blood pressure machine at a pharmacy. ? A home blood pressure monitor.  If you are between 31 years and 73 years old, ask your health  care provider if you should take aspirin to prevent strokes.  Have regular diabetes screenings. This involves taking a blood sample to check your fasting blood sugar level. ? If you are at a normal weight and have a low risk for diabetes, have this test once every three years after 70 years of age. ? If you are overweight and have a high risk for diabetes, consider being tested at a younger age or more often. Preventing infection Hepatitis B  If you have a higher risk for hepatitis B, you should be screened for this virus. You are considered at high risk for hepatitis B if: ? You were born in a country where hepatitis B is common. Ask your health care provider which countries are considered high risk. ? Your parents were born in a high-risk country, and you have not been immunized against hepatitis B (hepatitis B vaccine). ? You have HIV or AIDS. ? You use needles to inject street drugs. ? You live with someone who has hepatitis B. ? You have had sex with someone who has hepatitis B. ? You get hemodialysis treatment. ? You take certain medicines for conditions, including cancer, organ transplantation, and autoimmune conditions. Hepatitis C  Blood testing is recommended for: ? Everyone born from [birth year] through 1967. ? Anyone with known risk factors for hepatitis C. Sexually transmitted infections (STIs)  You should be screened for sexually transmitted infections (STIs) including gonorrhea and chlamydia if: ? You are sexually active and are younger than 71 years of age. ? You are older than 70 years of age and your health care provider tells you that you are at risk for this type of infection. ? Your sexual activity has changed since you were last screened and you are at an increased risk for chlamydia or gonorrhea. Ask your health care provider if you are at risk.  If you do not have HIV, but are at risk, it may be recommended that you take a prescription medicine daily to prevent HIV  infection. This is called pre-exposure prophylaxis (PrEP). You are considered at risk if: ? You are sexually active and do not regularly use condoms or know the HIV status of your partner(s). ? You take drugs by injection. ? You are sexually active with a partner who has HIV. Talk with your health care provider about whether you are at high risk of being infected with HIV. If you choose to begin PrEP, you should first be tested for HIV. You should then be tested every 3 months for as long as you are taking PrEP. Pregnancy  If you are premenopausal and you may become pregnant, ask your health care provider about preconception counseling.  If you may become pregnant, take 400 to 800 micrograms (mcg) of folic acid every day.  If you want to prevent pregnancy, talk to your health care provider about birth control (contraception). Osteoporosis and menopause  Osteoporosis is a disease in which the bones lose minerals and strength with aging. This can result in serious bone fractures. Your risk for osteoporosis can be identified using a bone density scan.  If you are 85 years of age or older, or if you are at risk for osteoporosis and fractures, ask your health care provider if you should be screened.  Ask your health care provider whether you should take  a calcium or vitamin D supplement to lower your risk for osteoporosis.  Menopause may have certain physical symptoms and risks.  Hormone replacement therapy may reduce some of these symptoms and risks. Talk to your health care provider about whether hormone replacement therapy is right for you. Follow these instructions at home:  Schedule regular health, dental, and eye exams.  Stay current with your immunizations.  Do not use any tobacco products including cigarettes, chewing tobacco, or electronic cigarettes.  If you are pregnant, do not drink alcohol.  If you are breastfeeding, limit how much and how often you drink alcohol.  Limit  alcohol intake to no more than 1 drink per day for nonpregnant women. One drink equals 12 ounces of beer, 5 ounces of wine, or 1 ounces of hard liquor.  Do not use street drugs.  Do not share needles.  Ask your health care provider for help if you need support or information about quitting drugs.  Tell your health care provider if you often feel depressed.  Tell your health care provider if you have ever been abused or do not feel safe at home. This information is not intended to replace advice given to you by your health care provider. Make sure you discuss any questions you have with your health care provider. Document Released: 07/18/2010 Document Revised: 06/10/2015 Document Reviewed: 10/06/2014 Elsevier Interactive Patient Education  2019 [place] your pharmacy to call me about your gabapentin

## 2018-02-09 ENCOUNTER — Other Ambulatory Visit: Payer: Self-pay | Admitting: Psychiatry

## 2018-02-11 NOTE — Telephone Encounter (Signed)
Pt called back and wanted to speak with the office.  Concerning these orders.  Pt states she cannot wait all day to speak with someone. However the office is closed for lunch. Pt hung up before I could let her know.

## 2018-02-12 ENCOUNTER — Telehealth: Payer: Self-pay

## 2018-02-12 ENCOUNTER — Ambulatory Visit: Payer: Self-pay

## 2018-02-12 DIAGNOSIS — G3184 Mild cognitive impairment, so stated: Secondary | ICD-10-CM

## 2018-02-12 DIAGNOSIS — I10 Essential (primary) hypertension: Secondary | ICD-10-CM

## 2018-02-12 DIAGNOSIS — F3164 Bipolar disorder, current episode mixed, severe, with psychotic features: Secondary | ICD-10-CM

## 2018-02-12 NOTE — Chronic Care Management (AMB) (Signed)
  Chronic Care Management   Follow Up Note   02/12/2018 Name: Angela [name] MRN: [id number] DOB: Sep 08, 1950  Referred by: Angela Courser, MD Reason for referral : Chronic Care Management (follow up to HH/CSW referral)   Subjective: "The Home Health Nurse from [facility] told me that I did not need her services but the Social Worker is faxing papers over the Angela [name] office for me to have personal assistance 7 days a week"   Objective:  <no information>   Assessment: Angela [name] is a 68 year old patient of Angela [name] who was referred11/22/19to Chronic Care Management as a "stat referral" related to homelessness.She is well know to the CCM Team. Angela [name] was not follow up upon after last encounter with CCM Angela [name] CM secondary to her behavior over the phone (see note 12/18/2017). Telephone encounter today to confirm patient has engaged with social work through [employer].  Goals Addressed    . COMPLETED: "I need a Education officer, museum" (pt-stated)       Angela [name] states she is actively engaged with a Education officer, museum through [employer]. Angela [name] states she does not qualify for skilled services at this time. States Education officer, museum will fax "papers" to PCP office for signature so that patient can receive in home assistance with ADLs and IADLs through [facility] services.  Current Barriers:  Marland Kitchen Mental health  Nurse Case Manager Clinical Goal(s): Over the next 14 days, patient will report engaging with Clinical Social Work from [employer] or [employer]  Interventions:  . Contacted [facility] and left message requesting return call (see previous encounter) . Reviewed needs with patient . Provided patient with number to [facility] [employer] [facility] and [employer] [facility] .  02/06/2018 Confirmed with Angela [name] referral intake specialist [facility] office) that patient was contacted and originally scheduled for intake assessment today 02/06/2018. Angela [name] provided Angela [name] CM with Clinical Manager  Angela Fuller, Angela [name] contact. CCM Angela [name] CM will follow up with Angela. [name] tomorrow to discuss status of intake.  02/12/2018 Confirmed with patient that she was engaged with [facility] LCSW.      Plan: Angela [name] Vandekamp has met all care management goals. She denies needs for additional services at this time from the [facility] Team. Review of patient status, including review of consultants reports, relevant laboratory and other test results, and collaboration with appropriate care team members and the patient's provider was performed as part of comprehensive patient evaluation and provision of chronic care management services.  The care management team is available to Angela. [name] at any time to assist with care management needs should they arise. Angela. has been given contact information and instructions to contact the care management team with any questions or should new care management needs arise.      Angela Viernes E. Rollene Rotunda, Angela [name], Angela [name] Nurse Care Coordinator [facility] Care Management  [phone]

## 2018-02-15 ENCOUNTER — Telehealth: Payer: Self-pay | Admitting: Family Medicine

## 2018-02-15 DIAGNOSIS — F4310 Post-traumatic stress disorder, unspecified: Secondary | ICD-10-CM

## 2018-02-15 DIAGNOSIS — M25373 Other instability, unspecified ankle: Secondary | ICD-10-CM

## 2018-02-15 DIAGNOSIS — F609 Personality disorder, unspecified: Secondary | ICD-10-CM

## 2018-02-15 DIAGNOSIS — F3164 Bipolar disorder, current episode mixed, severe, with psychotic features: Secondary | ICD-10-CM

## 2018-02-15 NOTE — Telephone Encounter (Signed)
Paperwork is on your desk, patient will be in 02/21/18 for an appt as well.      Copied from [place]. Topic: General - Other >> Feb 13, 2018  2:37 PM Carolyn Stare wrote:  Pt is asking if paperwork from [facility] was received and is asking for a call today >> Feb 15, 2018  3:40 PM Wynetta Emery, [place] C wrote: Pt is calling in again to follow up and check the status of paperwork from [facility]. Called the office and spoke with Melissa to confirm. While on the line with Melissa, pt hung up. Melissa advised that the nurse is in with a pt so she was unable to verify receipt of paperwork and that someone would follow up with pt to advise her.  >> Feb 15, 2018  3:42 PM Sheran Luz wrote: Patient calling back to inquire about paperwork. Requesting a call back before 5:00 today to confirm whether office has or has not received paperwork. Patient states it is time sensitive.

## 2018-02-15 NOTE — Telephone Encounter (Signed)
Copied from [place]. Topic: General - Other >> Feb 13, 2018  2:37 PM Carolyn Stare wrote:  Pt is asking if paperwork from [facility] was received and is asking for a call today >> Feb 15, 2018  3:40 PM Wynetta Emery, [place] C wrote: Pt is calling in again to follow up and check the status of paperwork from [facility]. Called the office and spoke with Melissa to confirm. While on the line with Melissa, pt hung up. Melissa advised that the nurse is in with a pt so she was unable to verify receipt of paperwork and that someone would follow up with pt to advise her.

## 2018-02-15 NOTE — Telephone Encounter (Signed)
Please let patient know that yes, we received paperwork

## 2018-02-15 NOTE — Telephone Encounter (Signed)
Copied from [place]. Topic: General - Other >> Feb 13, 2018  2:37 PM Carolyn Stare wrote:  Pt is asking if paperwork from [facility] was received and is asking for a call today >> Feb 15, 2018  3:40 PM Wynetta Emery, [place] C wrote: Pt is calling in again to follow up and check the status of paperwork from [facility]. Called the office and spoke with Melissa to confirm. While on the line with Melissa, pt hung up. Melissa advised that the nurse is in with a pt so she was unable to verify receipt of paperwork and that someone would follow up with pt to advise her.  >> Feb 15, 2018  3:42 PM Sheran Luz wrote: Patient calling back to inquire about paperwork. Requesting a call back before 5:00 today to confirm whether office has or has not received paperwork. Patient states it is time sensitive.

## 2018-02-18 ENCOUNTER — Emergency Department
Admission: EM | Admit: 2018-02-18 | Discharge: 2018-02-18 | Disposition: A | Discharge status: Home / Self Care | Payer: Medicare Other | Attending: Emergency Medicine | Admitting: Emergency Medicine

## 2018-02-18 ENCOUNTER — Other Ambulatory Visit: Payer: Self-pay

## 2018-02-18 ENCOUNTER — Emergency Department: Payer: Medicare Other

## 2018-02-18 DIAGNOSIS — R0789 Other chest pain: Secondary | ICD-10-CM | POA: Diagnosis not present

## 2018-02-18 DIAGNOSIS — Z79899 Other long term (current) drug therapy: Secondary | ICD-10-CM | POA: Insufficient documentation

## 2018-02-18 DIAGNOSIS — Z87891 Personal history of nicotine dependence: Secondary | ICD-10-CM | POA: Insufficient documentation

## 2018-02-18 DIAGNOSIS — E039 Hypothyroidism, unspecified: Secondary | ICD-10-CM | POA: Insufficient documentation

## 2018-02-18 DIAGNOSIS — R0602 Shortness of breath: Secondary | ICD-10-CM | POA: Diagnosis not present

## 2018-02-18 DIAGNOSIS — I10 Essential (primary) hypertension: Secondary | ICD-10-CM | POA: Diagnosis not present

## 2018-02-18 DIAGNOSIS — R509 Fever, unspecified: Secondary | ICD-10-CM | POA: Diagnosis not present

## 2018-02-18 DIAGNOSIS — R05 Cough: Secondary | ICD-10-CM | POA: Diagnosis not present

## 2018-02-18 DIAGNOSIS — R079 Chest pain, unspecified: Secondary | ICD-10-CM | POA: Insufficient documentation

## 2018-02-18 LAB — CBC WITH DIFFERENTIAL/PLATELET
ABS IMMATURE GRANULOCYTES: 0.03 10*3/uL (ref 0.00–0.07)
Basophils Absolute: 0 10*3/uL (ref 0.0–0.1)
Basophils Relative: 1 %
Eosinophils Absolute: 0.2 10*3/uL (ref 0.0–0.5)
Eosinophils Relative: 3 %
HCT: 40.9 % (ref 36.0–46.0)
HEMOGLOBIN: 13.4 g/dL (ref 12.0–15.0)
Immature Granulocytes: 1 %
Lymphocytes Relative: 22 %
Lymphs Abs: 1.4 10*3/uL (ref 0.7–4.0)
MCH: 30.7 pg (ref 26.0–34.0)
MCHC: 32.8 g/dL (ref 30.0–36.0)
MCV: 93.8 fL (ref 80.0–100.0)
Monocytes Absolute: 0.6 10*3/uL (ref 0.1–1.0)
Monocytes Relative: 9 %
Neutro Abs: 4 10*3/uL (ref 1.7–7.7)
Neutrophils Relative %: 64 %
Platelets: 309 10*3/uL (ref 150–400)
RBC: 4.36 MIL/uL (ref 3.87–5.11)
RDW: 14.5 % (ref 11.5–15.5)
WBC: 6.3 10*3/uL (ref 4.0–10.5)
nRBC: 0 % (ref 0.0–0.2)

## 2018-02-18 LAB — BASIC METABOLIC PANEL
Anion gap: 6 (ref 5–15)
BUN: 23 mg/dL (ref 8–23)
CHLORIDE: 104 mmol/L (ref 98–111)
CO2: 27 mmol/L (ref 22–32)
Calcium: 9.2 mg/dL (ref 8.9–10.3)
Creatinine, Ser: 0.63 mg/dL (ref 0.44–1.00)
GFR calc Af Amer: >60 mL/min (ref >60)
GFR calc non Af Amer: >60 mL/min (ref >60)
Glucose, Bld: 95 mg/dL (ref 70–99)
Potassium: 3.8 mmol/L (ref 3.5–5.1)
Sodium: 137 mmol/L (ref 135–145)

## 2018-02-18 LAB — TROPONIN I: Troponin I: <0.03 ng/mL (ref <0.03)

## 2018-02-18 NOTE — ED Provider Notes (Signed)
[facility] Emergency Department Provider Note   ____________________________________________   I have reviewed the triage vital signs and the nursing notes.   HISTORY  Chief Complaint Shortness of breath  History limited by: Not Limited   HPI Angela [name] is a 68 y.o. female who presents to the emergency department today because of concerns for shortness of breath.  It is unclear for exactly how long she has been having the shortness of breath but it does sound like it is been quite some time.  She states she is seen her primary care doctor as well as her pulmonologist about it.  She states she is not getting any answers or help.  She states that this shortness of breath has been accompanied by left chest pain.  She is also discussed this with her doctors without any answers.  She feels like her shortness of breath is causing her to be extremely weak.  She has not had any fevers.    Per medical record review patient has a history of gerd  Past Medical History:  Diagnosis Date  . Allergic rhinitis   . Anemia   . Blurred vision   . Depression   . Diverticulosis   . Endometriosis   . Falls   . GERD (gastroesophageal reflux disease)   . Hernia, hiatal   . Hypothyroidism   . IBS (irritable bowel syndrome)   . Malignant neoplasm of skin   . Migraine   . Neuropathy   . PTSD (post-traumatic stress disorder)   . Thyroid disease     Patient Active Problem List   Diagnosis Date Noted  . Preventative health care 02/08/2018  . Polypharmacy 02/01/2018  . Personality disorder ([place]) 12/25/2017  . Noncompliance 12/25/2017  . Medication monitoring encounter 12/07/2017  . Family history of high cholesterol 12/07/2017  . Arthritis of hand, degenerative 12/07/2017  . Bipolar I disorder, most recent episode mixed, severe with psychotic features ([place]) 09/18/2017  . Agitation 09/18/2017  . Depressed mood 07/11/2017  . Hallux valgus, acquired 07/05/2017   . Hammer toes of both feet 07/05/2017  . Unstable ankle 07/05/2017  . Chronic bilateral low back pain without sciatica 06/09/2017  . Tinea unguium 05/08/2017  . Unresolved grief 05/08/2017  . Conductive hearing loss of right ear with unrestricted hearing of left ear 04/03/2017  . Adjustment disorder with depressed mood 01/18/2017  . Migraine 01/18/2017  . Postmenopausal bleeding 01/18/2017  . IBS (irritable bowel syndrome) 11/17/2016  . Bilateral carpal tunnel syndrome 11/17/2016  . Plantar fasciitis, bilateral 11/17/2016  . Cervical myofascial pain syndrome 11/17/2016  . Chronic hyponatremia 11/17/2016  . PTSD (post-traumatic stress disorder) 11/17/2016  . Hernia, hiatal 11/17/2016  . Vitamin D deficiency 11/17/2016  . Vitamin B12 deficiency 11/17/2016  . SIADH (syndrome of inappropriate ADH production) ([place]) 09/08/2016  . Cervical spondylosis with myelopathy 05/24/2016  . Post-concussion headache 05/24/2016  . MCI (mild cognitive impairment) with memory loss 03/30/2016  . Gait abnormality 03/30/2016  . Chronic bipolar affective disorder ([place]) 01/24/2016  . Recurrent major depressive disorder, in partial remission ([place]) 01/24/2016  . Anemia, iron deficiency 06/24/2015  . Endometriosis 06/24/2015  . Essential hypertension 06/24/2015  . History of falling 05/22/2015  . History of TIA (transient ischemic attack) 04/20/2015  . Hypothyroidism 04/20/2015  . GERD (gastroesophageal reflux disease) 04/20/2015  . Cervical disc disorder at C5-C6 level with radiculopathy 03/25/2015  . Chronic neck pain 03/25/2015  . Pelvic pain in female 10/30/2014  . History of skin cancer  06/30/2014  . Chronic sinusitis 06/30/2014  . Chronic headaches 12/16/2013  . Neuropathy 12/16/2013    Past Surgical History:  Procedure Laterality Date  . APPENDECTOMY    . CHOLECYSTECTOMY    . CHOLECYSTECTOMY, LAPAROSCOPIC    . ESOPHAGOGASTRODUODENOSCOPY (EGD) WITH PROPOFOL N/A 03/29/2015   Procedure:  ESOPHAGOGASTRODUODENOSCOPY (EGD) WITH PROPOFOL;  Surgeon: Josefine Class, MD;  Location: [facility] ENDOSCOPY;  Service: Endoscopy;  Laterality: N/A;  . HERNIA REPAIR    . laproscopy    . TONSILLECTOMY    . uteral suspension      Prior to Admission medications   Medication Sig Start Date End Date Taking? Authorizing Provider  acetaminophen (TYLENOL) 650 MG CR tablet Take 650 mg by mouth every 8 (eight) hours as needed for pain.    [provider]  albuterol (PROVENTIL HFA;VENTOLIN HFA) 108 (90 Base) MCG/ACT inhaler Inhale 2 puffs into the lungs every 6 (six) hours as needed for wheezing or shortness of breath. Patient not taking: Reported on 02/08/2018 12/26/17   Clapacs, Madie Reno, MD  amLODipine (NORVASC) 5 MG tablet Take 1 tablet (5 mg total) by mouth 2 (two) times daily. 01/28/18   Arnetha Courser, MD  Biotin 10000 MCG TABS Take by mouth.    [provider]  Cholecalciferol 25 MCG (1000 UT) tablet Take 1 tablet (1,000 Units total) by mouth daily. 12/26/17   Clapacs, Madie Reno, MD  Cyanocobalamin (VITAMIN B-12) 1000 MCG SUBL Place under the tongue.    [provider]  cycloSPORINE (RESTASIS) 0.05 % ophthalmic emulsion Place 1 drop into both eyes 2 (two) times daily. 12/26/17   Clapacs, Madie Reno, MD  diclofenac sodium (VOLTAREN) 1 % GEL  02/04/18   [provider]  dicyclomine (BENTYL) 10 MG capsule Take 10 mg by mouth as needed for spasms.    [provider]  ferrous sulfate 325 (65 FE) MG tablet Take 325 mg by mouth daily with breakfast.    [provider]  gabapentin (NEURONTIN) 100 MG capsule Take 100 mg by mouth 2 (two) times daily.    [provider]  Glucosamine-Chondroitin (GLUCOSAMINE CHONDR COMPLEX PO) Take by mouth.    [provider]  guaiFENesin (MUCINEX) 600 MG 12 hr tablet Take by mouth 2 (two) times daily.    [provider]  hydrOXYzine (ATARAX/VISTARIL) 10 MG tablet TAKE 1 TO 2 TABLETS BY MOUTH ONCE DAILY  AS NEEDED 02/05/18   [provider]  Icosapent Ethyl (VASCEPA) 1 g CAPS Two capsules by mouth twice a day (to help with triglycerides) 12/26/17   Clapacs, Madie Reno, MD  ipratropium (ATROVENT) 0.03 % nasal spray Place 2 sprays into both nostrils every 12 (twelve) hours.    [provider]  levalbuterol Penne Lash) 1.25 MG/3ML nebulizer solution  02/05/18   [provider]  levothyroxine (SYNTHROID, LEVOTHROID) 100 MCG tablet Take 1 tablet (100 mcg total) by mouth daily at 6 (six) AM. 12/27/17   Clapacs, Madie Reno, MD  lidocaine (LIDODERM) 5 % Place 1 patch onto the skin daily. Remove & Discard patch within 12 hours or as directed by MD 12/26/17   Clapacs, Madie Reno, MD  linaclotide [facility]) 290 MCG CAPS capsule Take 1 capsule (290 mcg total) by mouth daily before breakfast. 12/26/17   Clapacs, Madie Reno, MD  LORazepam (ATIVAN) 0.5 MG tablet TAKE 1 TABLET BY MOUTH AS NEEDED ONCE DAILY 02/06/18   [provider]  magnesium oxide (MAG-OX) 400 (241.3 Mg) MG tablet Take 0.5 tablets (200  mg total) by mouth 2 (two) times daily. 12/26/17   Clapacs, Madie Reno, MD  Multiple Vitamin (MULTIVITAMIN WITH MINERALS) TABS tablet Take 1 tablet by mouth daily. 12/26/17   Clapacs, Madie Reno, MD  PARoxetine (PAXIL-CR) 25 MG 24 hr tablet Take 1 tablet (25 mg total) by mouth daily. 12/27/17   Clapacs, Madie Reno, MD  polyethylene glycol (MIRALAX / GLYCOLAX) packet Take 17 g by mouth daily. 12/26/17   Clapacs, Madie Reno, MD  polyvinyl alcohol (LIQUIFILM TEARS) 1.4 % ophthalmic solution Place 1 drop into both eyes as needed for dry eyes. 12/26/17   Clapacs, Madie Reno, MD  PREMARIN vaginal cream  01/31/18   [provider]  promethazine (PHENERGAN) 12.5 MG tablet Take by mouth. 02/05/18 08/04/18  [provider]  pyridoxine (B-6) 100 MG tablet Take 1 tablet (100 mg total) by mouth 2 (two) times daily. 12/26/17   Clapacs, Madie Reno, MD  QUEtiapine (SEROQUEL) 25 MG tablet Take 1 tablet (25 mg total) by mouth at  bedtime. Patient taking differently: Take 25 mg by mouth at bedtime. Pt states she cuts tablet in 1/4 to take PRN 12/26/17   Clapacs, Madie Reno, MD  simethicone (MYLICON) 80 MG chewable tablet Chew by mouth. 02/05/18 02/05/19  [provider]  tiZANidine (ZANAFLEX) 2 MG tablet Take by mouth every 6 (six) hours as needed for muscle spasms.    [provider]  traZODone (DESYREL) 50 MG tablet Pt takes 1/2 tablet qhs 01/31/18   [provider]    Allergies Advair diskus [fluticasone-salmeterol]; Bee venom; Darvon [propoxyphene]; Dicyclomine; Other; Oxycodone-acetaminophen; Peanuts [peanut oil]; Penicillins; Sulfa antibiotics; Sulfacetamide sodium; Ultram [tramadol]; Amikacin; Aspirin; Doxycycline; Haloperidol; Hymenoptera venom preparations; Imipramine; Keflex [cephalexin]; Lactose intolerance (gi); Risperdal [risperidone]; Sulfur; Adhesive [tape]; Albuterol; Ciprofloxacin; Hydroxyzine; Peanut butter flavor; and Prednisone  Family History  Problem Relation Age of Onset  . Heart disease Father   . Hearing loss Father   . COPD Father   . Depression Father   . Stroke Father   . Vision loss Father   . Varicose Veins Father   . Cancer Mother        lung  . Depression Sister   . Arthritis Sister   . Arthritis Brother   . Hernia Brother   . Anxiety disorder Brother   . Arthritis Brother   . Hernia Brother   . Anxiety disorder Brother   . Urolithiasis Neg Hx   . Kidney disease Neg Hx   . Kidney cancer Neg Hx   . Prostate cancer Neg Hx     Social History Social History   Tobacco Use  . Smoking status: Former Smoker    Last attempt to quit: 11/17/1976    Years since quitting: 41.2  . Smokeless tobacco: Never Used  Substance Use Topics  . Alcohol use: No  . Drug use: No    Review of Systems Constitutional: No fever/chills Eyes: No visual changes. ENT: No sore throat. Cardiovascular: Positive  chest pain. Respiratory: Positive shortness of  breath. Gastrointestinal: No abdominal pain.  No nausea, no vomiting.  No diarrhea.   Genitourinary: Negative for dysuria. Musculoskeletal: Negative for back pain. Skin: Negative for rash. Neurological: Negative for headaches, focal weakness or numbness.  ____________________________________________   PHYSICAL EXAM:  VITAL SIGNS: ED Triage Vitals  Enc Vitals Group     BP 02/18/18 2047 (!) 143/70     Pulse Rate 02/18/18 2047 65     Resp 02/18/18 2047 10     Temp  02/18/18 2047 98.5 F (36.9 C)     Temp Source 02/18/18 2047 Oral     SpO2 02/18/18 2047 100 %     Weight 02/18/18 2049 130 lb 1.1 oz (59 kg)     Height 02/18/18 2049 5\' 5"  (1.651 m)     Head Circumference --      Peak Flow --      Pain Score 02/18/18 2048 10   Constitutional: Alert and oriented.  Eyes: Conjunctivae are normal.  ENT      Head: Normocephalic and atraumatic.      Nose: No congestion/rhinnorhea.      Mouth/Throat: Mucous membranes are moist.      Neck: No stridor. Hematological/Lymphatic/Immunilogical: No cervical lymphadenopathy. Cardiovascular: Normal rate, regular rhythm.  No murmurs, rubs, or gallops.  Respiratory: Normal respiratory effort without tachypnea nor retractions. Breath sounds are clear and equal bilaterally. No wheezes/rales/rhonchi. Gastrointestinal: Soft and non tender. No rebound. No guarding.  Genitourinary: Deferred Musculoskeletal: Normal range of motion in all extremities. No lower extremity edema. Neurologic:  Normal speech and language. No gross focal neurologic deficits are appreciated.  Skin:  Skin is warm, dry and intact. No rash noted. Psychiatric: Mood and affect are normal. Speech and behavior are normal. Patient exhibits appropriate insight and judgment.  ____________________________________________    LABS (pertinent positives/negatives)  Trop <0.03 BMP wnl CBC wbc 6.3, hgb 13.4, plt 309  ____________________________________________    RADIOLOGY  CXR No  pneumonia, some atelectasis.    ____________________________________________   PROCEDURES  Procedures  ____________________________________________   INITIAL IMPRESSION / ASSESSMENT AND PLAN / ED COURSE  Pertinent labs & imaging results that were available during my care of the patient were reviewed by me and considered in my medical decision making (see chart for details).   Extent to the patient presented to the emergency department today because of concerns for shortness of breath.  She is also complained of some left-sided chest pain.  Does sound like these symptoms have been going on for quite some time given that she is seen both her primary care and pulmonologist for.  Patient's work-up here without concerning findings.  I discussed with patient that at this point do not feel any further emergent work-up is necessary.  Did discuss with patient portance of continued outpatient follow-up.   ____________________________________________   FINAL CLINICAL IMPRESSION(S) / ED DIAGNOSES  Final diagnoses:  Shortness of breath     Note: This dictation was prepared with Dragon dictation. Any transcriptional errors that result from this process are unintentional     Nance Pear, MD 02/18/18 2336

## 2018-02-18 NOTE — Telephone Encounter (Signed)
Pt.notified

## 2018-02-18 NOTE — ED Triage Notes (Signed)
Pt arrived from home via EMS with complaints of chest pain x 1 week. Pt reports to EMS that she has had fevers and cough also. Pt refuses IV. Pt states allergy to ASA. VS per EMS BP-144/70 HR-66. Hx of COPD.

## 2018-02-18 NOTE — Discharge Instructions (Addendum)
Please seek medical attention for any high fevers, chest pain, shortness of breath, change in behavior, persistent vomiting, bloody stool or any other new or concerning symptoms.  

## 2018-02-19 DIAGNOSIS — M25373 Other instability, unspecified ankle: Secondary | ICD-10-CM | POA: Diagnosis not present

## 2018-02-19 DIAGNOSIS — M25372 Other instability, left ankle: Secondary | ICD-10-CM | POA: Diagnosis not present

## 2018-02-19 DIAGNOSIS — R2681 Unsteadiness on feet: Secondary | ICD-10-CM | POA: Diagnosis not present

## 2018-02-20 DIAGNOSIS — R69 Illness, unspecified: Secondary | ICD-10-CM

## 2018-02-20 NOTE — Assessment & Plan Note (Signed)
Managed by Dr. Bonnita Nasuti, podiatrist at [facility] per patient

## 2018-02-20 NOTE — Assessment & Plan Note (Signed)
Creates difficulty with patient-physician relationship, as well as her relationships with other staff members, outside agencies, other providers

## 2018-02-20 NOTE — Assessment & Plan Note (Signed)
Seeing psychiatrist 

## 2018-02-20 NOTE — Assessment & Plan Note (Signed)
I explained to the patient that referrals have been placed for her requests and attempts have been made to reach her from other doctor offices; however, she is not returning their calls; I urged her to answer her calls or at least return their messages to get these appointments made

## 2018-02-20 NOTE — Assessment & Plan Note (Signed)
Managed by psychiatry 

## 2018-02-20 NOTE — Assessment & Plan Note (Signed)
Patient aware of diagnosis

## 2018-02-21 ENCOUNTER — Ambulatory Visit: Payer: Self-pay | Admitting: Family Medicine

## 2018-02-22 NOTE — Telephone Encounter (Signed)
Patient calling to check the status of getting a home health aide. States that paperwork was sent to the office and she has not heard anything about an aide. Spoke to [employer] and was advised that the paperwork was not in the front, so it must be in the back waiting to be filled out. Was advised that they have been short staffed today due to nurses home sick. Relayed this message to the patient and she became very upset and states that she had been waiting on this paperwork for a while. States that paperwork was originally sent back on November, 2019 and was denied along with some medication refills. Patient states that it seems like Dr Sanda Klein has too many clients since she cannot seem to take care of her needs. States multiple times that she is filing a complaint and needs to hear back from Dr Sanda Klein today before she leaves the office to tell her how she feels in person.

## 2018-02-22 NOTE — Telephone Encounter (Signed)
Called patient to notify [employer] forms already faxed in.  She was very upset cause she has not heard anything.  Is requesting [facility] to [facility].

## 2018-02-22 NOTE — Addendum Note (Signed)
Addended by: Docia Furl on: 02/22/2018 04:49 PM   Modules accepted: Orders

## 2018-02-26 ENCOUNTER — Telehealth: Payer: Self-pay | Admitting: Family Medicine

## 2018-02-26 ENCOUNTER — Encounter: Payer: Self-pay | Admitting: Family Medicine

## 2018-02-26 ENCOUNTER — Ambulatory Visit (INDEPENDENT_AMBULATORY_CARE_PROVIDER_SITE_OTHER): Payer: Medicare Other | Admitting: Family Medicine

## 2018-02-26 VITALS — BP 124/80 | HR 70 | Temp 98.1°F | Ht 63.0 in | Wt 137.3 lb

## 2018-02-26 DIAGNOSIS — F319 Bipolar disorder, unspecified: Secondary | ICD-10-CM

## 2018-02-26 DIAGNOSIS — E039 Hypothyroidism, unspecified: Secondary | ICD-10-CM

## 2018-02-26 DIAGNOSIS — E781 Pure hyperglyceridemia: Secondary | ICD-10-CM

## 2018-02-26 DIAGNOSIS — F609 Personality disorder, unspecified: Secondary | ICD-10-CM

## 2018-02-26 DIAGNOSIS — R252 Cramp and spasm: Secondary | ICD-10-CM

## 2018-02-26 DIAGNOSIS — J0111 Acute recurrent frontal sinusitis: Secondary | ICD-10-CM

## 2018-02-26 DIAGNOSIS — G5603 Carpal tunnel syndrome, bilateral upper limbs: Secondary | ICD-10-CM

## 2018-02-26 DIAGNOSIS — R197 Diarrhea, unspecified: Secondary | ICD-10-CM

## 2018-02-26 MED ORDER — FLUCONAZOLE 150 MG PO TABS: 150.0000 mg | ORAL_TABLET | Freq: Every day | ORAL | 1 refills | Status: DC

## 2018-02-26 MED ORDER — DOXYCYCLINE HYCLATE 100 MG PO TABS: 100.0000 mg | ORAL_TABLET | Freq: Two times a day (BID) | ORAL | 0 refills | Status: DC

## 2018-02-26 MED ORDER — AMLODIPINE BESYLATE 5 MG PO TABS: 5.0000 mg | ORAL_TABLET | Freq: Every day | ORAL | Status: DC

## 2018-02-26 NOTE — Telephone Encounter (Signed)
Thank you for working on this for her I will be happy to sign any paperwork to get her PCS approved with whatever company she desires and can provide the needed services

## 2018-02-26 NOTE — Telephone Encounter (Signed)
Patient wants to use [facility] for [place] Please make sure that gets started ASAP Thank you

## 2018-02-26 NOTE — Telephone Encounter (Signed)
Spoke with Trish Fountain and she stated she did not think that [facility] did personal care services. She called [employer], LCSW with [facility] in regards to who does and what our next step is. Truddie Crumble will get back in touch with me when Crystal reaches back out to her.

## 2018-02-26 NOTE — Patient Instructions (Addendum)
Juliann Pulse should be calling you today about the personal care services  You are welcome to contact [facility]  Administrative Clinical Assistant  Direct Dial: [phone]  Please start the antibiotics Be extra careful to avoid sun exposure while on this medicine Please do eat yogurt or kimchi or take a probiotic daily for the next month We want to replace the healthy germs in the gut If you notice foul, watery diarrhea in the next two months, schedule an appointment RIGHT AWAY or go to an urgent care or the emergency room if a holiday or over a weekend  We'll get labs If you have not heard anything from my staff in a week about any orders/referrals/studies from today, please contact us here to follow-up [phone]

## 2018-02-26 NOTE — Telephone Encounter (Signed)
Patient stated that she forgot to mention that she need physical therapy for carpal tunnel. She would like to go to [place] to see [facility].

## 2018-02-26 NOTE — Telephone Encounter (Signed)
Thank you :)

## 2018-02-26 NOTE — Telephone Encounter (Signed)
Just spoke with [facility] and they do not offer personal care services. Will update as soon as I am updated.

## 2018-02-26 NOTE — Telephone Encounter (Signed)
Notified patient while in the office, in the lab, that [facility] does not offer personal care services and that we would contact her as soon as we found someone who did.

## 2018-02-26 NOTE — Progress Notes (Signed)
BP 124/80   Pulse 70   Temp 98.1 F (36.7 C)   Ht 5\' 3"  (1.6 m)   Wt 137 lb 4.8 oz (62.3 kg) Comment: with boot on left foot  SpO2 95%   BMI 24.32 kg/m    Subjective:    Patient ID: Angela [name], female    DOB: 14-Feb-1950, 68 y.o.   MRN: [id number]  HPI: Angela [name] is a 68 y.o. female  Chief Complaint  Patient presents with  . Follow-up    Paperwork    HPI Patient is here to "discuss paperwork" per the schedule but she says that is not why she is here She needs to know what the plan is Most people just talk to their patient she says She says she is disturbed by me typing She has been asking for a nutritionist for a home health aide and for a review of medications We're still stuck on base one she says They called her today and they are a diabetic clinic, and she said "What the F" She demanded that I call them right now; why would I send her to a diabetes clinic; I tried to explain that they handle all nutrition matters but she did not believe me She asked why she wasn't seeing someone at the [facility]  She says nothing for home health, nothing Then she says one nurse did come out; she was laughing and said Trish Fountain don't know what she is doing I don't know why you have people working for you who don't know what they're doing I called and called and called and called, finally someone came out she says She complained about not getting PCS; I explained that I received a letter on Jan 28th and I filled out the paperwork on Jan 29th Someone named Antionette who is with [facility] something or [place] something contacted her She will give [place] a call; patient says that isn't good enough  Regarding her nutrition referral, Severiano Gilbert reached out to her and here are the notes  Jamison Neighbor, MD Cc: Sibyl Parr, [place]        Your patient whom you referred for the Medical Nutrition Therapy Program has not come as you had  requested.  Please discuss this with your patient.  When he/she decides to commit to the program, please let us know.   Our office cannot guarantee benefits or services per her insurance for her diagnosis of poor appetite. I tried to explain that she would need to call insurance to verify but she was in a hurry and wanted to do this quickly. She stated that if I cannot pay for her services then the phone call was a waste of her time. I told her again she would need to call. Phone conversation ended with no need to schedule and there were other places in [place] she can go to for nutrition services. In such a rush, I could not thoroughly explain what our services provided or how she could find out.   Thank you for this referral, and if we can be of further assistance to you or your patient, let us know.     [place], Diabetes [facility] Nutrition Counseling  Administrative Clinical Assistant  Direct Dial: [phone]  Fax: [phone]  Website: [email or web address]     Regarding her ankle, she saw Dr. Governor Specking on Feb 07, 2018  From his note: ASSESSMENT:  Gait instability (primary encounter diagnosis) Plan: MRI ankle left without contrast, MRI ankle  right without contrast, Wheelchair  Unstable ankle, unspecified laterality Plan: MRI ankle left without contrast, MRI ankle  right without contrast, Wheelchair  Hallux valgus with bunions, right Plan: Wheelchair  Hallux valgus with bunions, left Plan: Wheelchair  Hammer toes of both feet Plan: Wheelchair   She had an MRI of the LEFT ankle/hindfoot on Feb 19, 2018, copied and pasted from Care Everywhere:   IMPRESSION:   1. No specific etiology for ankle instability identified.  2. There may be minimal peroneus longus tendinopathy. All ankle tendons are  intact.  3. Incidental note of accessory navicular. Correlation with clinical exam  would be helpful to confirm or exclude symptoms  related to this  developmental variant.      Electronically Signed by: Cecilio Asper, MD, [name]  Electronically Signed on: 02/19/2018 3:48 PM   MRI RIGHT ankle/hindfoot Feb 19, 2018: IMPRESSION:  1. No specific etiology for ankle instability identified. 2. There may be very minimal peroneus longus tendinopathy. All ankle tendons are intact. Please correlate with clinical exam regarding presence or absence of FHL tenosynovitis. 3. Mild edema within the plantar fat pad. Other incidental findings as noted above.   Electronically Signed by: Cecilio Asper, MD, [facility] Electronically Signed on: 02/19/2018 4:11 PM  She has a terrible lot of diarrhea and bronchitis and sinusitis She sees Dr. Raul Del for her bronchitis issues "It is very frustrating to work with you," she tells me Number one, I have refused to refill over and over again with medicines which I have done repeatedly Number two, if you don't like being "da man" in street talk You don't notice my hair, you don't notice the bags under my eyes, the cold sore on my mouth...  At the end of the day, she has no nutritionist and no home health services  I'm thinking every time I come see Dr. Sanda Klein she spends a hell of a lot of time on that machine (computer)  She says she has thought long and hard, this may be our last meeting  I asked about the diarrhea; she is dribbling down her clothes; probably very dehydrated She has really bad bronchitis and sinusitis I had to go to the hospital a couple of times CBC normal, BMP normal, trop I negative Having some burning with urination Wheezing and having trouble breathing, doing nebulizer every day Coughing; bringing up some post nasal drip Taking mucinex, ibuprofen, nebulizers; I don't have an inhaler; "I don't know if he refused to do the prior authorization"; Ventolin is the only one she can use She says she needs an antibiotic She feels feverish; her norm is  97.4; 99 is very high for her I know she has an infection She has a lot of pain in the sinuses; sore throat; I don't feel good She has multiple antibiotics; she says zpaks aren't very effective She has taken doxycycline in the winter, but did get a sun burn She has bought two light lamps, then health insurance wasn't covering She has headaches  Depression screen [facility] 2/9 02/26/2018 02/08/2018 01/28/2018 12/07/2017 11/17/2016  Decreased Interest 2 0 0 3 3  Down, Depressed, Hopeless 1 1 1 3 3   PHQ - 2 Score 3 1 1 6 6   Altered sleeping 3 3 3 3 3   Tired, decreased energy 3 3 2 3 3   Change in appetite 3 3 2 3 3   Feeling bad or failure about yourself  1 1 0 3 3  Trouble concentrating 3 3 1 3 3   Moving slowly or fidgety/restless 0 0 0 0 0  Suicidal thoughts 0 0 0 0 0  PHQ-9 Score 16 14 9 21 21   Difficult doing work/chores Extremely dIfficult - - - Somewhat difficult  Some recent data might be hidden   Fall Risk  02/26/2018 02/08/2018 01/28/2018 12/07/2017 11/17/2016  Falls in the past year? 1 1 1 1  Yes  Number falls in past yr: 1 1 1 1  -  Injury with Fall? 1 1 1 1  -  Risk for fall due to : - - - - History of fall(s)  Follow up - Falls prevention discussed - - -    Relevant past medical, surgical, family and social history reviewed Past Medical History:  Diagnosis Date  . Allergic rhinitis   . Anemia   . Blurred vision   . Depression   . Diverticulosis   . Endometriosis   . Falls   . GERD (gastroesophageal reflux disease)   . Hernia, hiatal   . Hypothyroidism   . IBS (irritable bowel syndrome)   . Malignant neoplasm of skin   . Migraine   . Neuropathy   . PTSD (post-traumatic stress disorder)   . Thyroid disease    Past Surgical History:  Procedure Laterality Date  . APPENDECTOMY    . CHOLECYSTECTOMY    . CHOLECYSTECTOMY, LAPAROSCOPIC    . ESOPHAGOGASTRODUODENOSCOPY (EGD) WITH PROPOFOL N/A 03/29/2015   Procedure: ESOPHAGOGASTRODUODENOSCOPY (EGD) WITH PROPOFOL;  Surgeon: Josefine Class, MD;  Location: [facility] ENDOSCOPY;  Service: Endoscopy;  Laterality: N/A;  . HERNIA REPAIR    . laproscopy    . TONSILLECTOMY    . uteral suspension     Family History  Problem Relation Age of Onset  . Heart disease Father   . Hearing loss Father   . COPD Father   . Depression Father   . Stroke Father   . Vision loss Father   . Varicose Veins Father   . Cancer Mother        lung  . Depression Sister   . Arthritis Sister   . Arthritis Brother   . Hernia Brother   . Anxiety disorder Brother   . Arthritis Brother   . Hernia Brother   . Anxiety disorder Brother   . Urolithiasis Neg Hx   . Kidney disease Neg Hx   . Kidney cancer Neg Hx   . Prostate cancer Neg Hx    Social History   Tobacco Use  . Smoking status: Former Smoker    Last attempt to quit: 11/17/1976    Years since quitting: 41.3  . Smokeless tobacco: Never Used  Substance Use Topics  . Alcohol use: No  . Drug use: No     Office Visit from 02/26/2018 in [facility]  AUDIT-C Score  0      Interim medical history since last visit reviewed. Allergies and medications reviewed  Review of Systems Per HPI unless specifically indicated above     Objective:    BP 124/80   Pulse 70   Temp 98.1 F (36.7 C)   Ht 5\' 3"  (1.6 m)   Wt 137 lb 4.8 oz (62.3 kg) Comment: with boot on left foot  SpO2 95%   BMI 24.32 kg/m   Wt Readings from Last 3 Encounters:  02/26/18 137 lb 4.8 oz (62.3 kg)  02/18/18 130 lb 1.1 oz (59 kg)  02/08/18 130  lb (59 kg)    Physical Exam Constitutional:      General: She is not in acute distress.    Appearance: She is well-developed.  HENT:     Nose: Mucosal edema and rhinorrhea present.     Right Sinus: Maxillary sinus tenderness and frontal sinus tenderness present.     Left Sinus: Maxillary sinus tenderness and frontal sinus tenderness present.     Mouth/Throat:     Pharynx: No oropharyngeal exudate or posterior oropharyngeal erythema.  Eyes:      General: No scleral icterus. Cardiovascular:     Rate and Rhythm: Normal rate and regular rhythm.  Pulmonary:     Effort: Pulmonary effort is normal.     Breath sounds: Normal breath sounds.  Abdominal:     General: Abdomen is flat. Bowel sounds are normal.     Palpations: Abdomen is soft.  Lymphadenopathy:     Cervical: No cervical adenopathy.  Neurological:     Mental Status: She is alert.  Psychiatric:        Mood and Affect: Mood is not anxious or depressed. Affect is angry.        Behavior: Behavior is aggressive.        Judgment: Judgment is inappropriate.     Comments: Used curse words when being worked up with the [place] per her report; angry, direct, and confrontational for the majority of the visit with the MD; after having labs drawn, more pleasant, shook hands with examiner       Assessment & Plan:   Problem List Items Addressed This Visit      Endocrine   Hypothyroidism   Relevant Orders   TSH     Other   Personality disorder ([place])    I am aware patient has mental illness; she has alienated others in the past with her behavior; she is quite angry today; I have encouraged my staff to keep this in mind when we deal with her, to try to understand and extend grace when she is confrontational or angry or demanding; she also tends to put things in extremes which I try to keep in mind; we contacted company about the PCS and I also reached out to nutrition for her      Chronic bipolar affective disorder [facility])    Working with psychiatrist       Other Visit Diagnoses    Acute recurrent frontal sinusitis    -  Primary   we reviewed her multiple antibiotic reactions; I believe the safest one for her at this time is doxy, as long as she avoids sun exposure; she agrees; C diff AVS   Relevant Medications   doxycycline (VIBRA-TABS) 100 MG tablet   fluconazole (DIFLUCAN) 150 MG tablet   Diarrhea, unspecified type       check labs today   Relevant Orders   CBC with  Differential/Platelet (Completed)   COMPLETE METABOLIC PANEL WITH GFR (Completed)   Gastrointestinal Pathogen Panel PCR   Magnesium (Completed)   Leg cramps       check K+ and Mg2+   Relevant Orders   Magnesium (Completed)   High triglycerides       Relevant Medications   amLODipine (NORVASC) 5 MG tablet   Other Relevant Orders   Lipid panel       Follow up plan: Return in about 1 week (around 03/05/2018) for follow-up visit with Dr. Sanda Klein.  An after-visit summary was printed and given to the patient at [place].  Please see the patient instructions which may contain other information and recommendations beyond what is mentioned above in the assessment and plan.  Meds ordered this encounter  Medications  . doxycycline (VIBRA-TABS) 100 MG tablet    Sig: Take 1 tablet (100 mg total) by mouth 2 (two) times daily.    Dispense:  20 tablet    Refill:  0  . fluconazole (DIFLUCAN) 150 MG tablet    Sig: Take 1 tablet (150 mg total) by mouth daily. Do not take cholesterol medicine (statin) for 3 days    Dispense:  1 tablet    Refill:  1  . amLODipine (NORVASC) 5 MG tablet    Sig: Take 1 tablet (5 mg total) by mouth daily.    Orders Placed This Encounter  Procedures  . CBC with Differential/Platelet  . COMPLETE METABOLIC PANEL WITH GFR  . Gastrointestinal Pathogen Panel PCR  . Magnesium  . Lipid panel  . TSH  . Lipid panel  . TSH

## 2018-02-27 ENCOUNTER — Other Ambulatory Visit: Payer: Self-pay | Admitting: Family Medicine

## 2018-02-27 ENCOUNTER — Ambulatory Visit: Payer: Self-pay

## 2018-02-27 ENCOUNTER — Telehealth: Payer: Self-pay

## 2018-02-27 DIAGNOSIS — F4310 Post-traumatic stress disorder, unspecified: Secondary | ICD-10-CM

## 2018-02-27 DIAGNOSIS — E222 Syndrome of inappropriate secretion of antidiuretic hormone: Secondary | ICD-10-CM

## 2018-02-27 LAB — COMPLETE METABOLIC PANEL WITH GFR
AG Ratio: 1.9 (calc) (ref 1.0–2.5)
ALKALINE PHOSPHATASE (APISO): 96 U/L (ref 37–153)
ALT: 26 U/L (ref 6–29)
AST: 26 U/L (ref 10–35)
Albumin: 4.6 g/dL (ref 3.6–5.1)
BUN: 17 mg/dL (ref 7–25)
CO2: 27 mmol/L (ref 20–32)
Calcium: 9.8 mg/dL (ref 8.6–10.4)
Chloride: 103 mmol/L (ref 98–110)
Creat: 0.93 mg/dL (ref 0.50–0.99)
GFR, Est African American: 73 mL/min/{1.73_m2} (ref >=60)
GFR, Est Non African American: 63 mL/min/{1.73_m2} (ref >=60)
Globulin: 2.4 g/dL (calc) (ref 1.9–3.7)
Glucose, Bld: 84 mg/dL (ref 65–99)
Potassium: 4.8 mmol/L (ref 3.5–5.3)
Sodium: 138 mmol/L (ref 135–146)
Total Bilirubin: 0.2 mg/dL (ref 0.2–1.2)
Total Protein: 7 g/dL (ref 6.1–8.1)

## 2018-02-27 LAB — LIPID PANEL
Cholesterol: 171 mg/dL (ref <200)
HDL: 60 mg/dL (ref >=50)
LDL Cholesterol (Calc): 92 mg/dL (calc)
Non-HDL Cholesterol (Calc): 111 mg/dL (calc) (ref <130)
Total CHOL/HDL Ratio: 2.9 (calc) (ref <5.0)
Triglycerides: 98 mg/dL (ref <150)

## 2018-02-27 LAB — CBC WITH DIFFERENTIAL/PLATELET
Absolute Monocytes: 586 cells/uL (ref 200–950)
Basophils Absolute: 48 cells/uL (ref 0–200)
Basophils Relative: 1 %
Eosinophils Absolute: 163 cells/uL (ref 15–500)
Eosinophils Relative: 3.4 %
HEMATOCRIT: 36.5 % (ref 35.0–45.0)
Hemoglobin: 12.4 g/dL (ref 11.7–15.5)
Lymphs Abs: 1541 cells/uL (ref 850–3900)
MCH: 31.2 pg (ref 27.0–33.0)
MCHC: 34 g/dL (ref 32.0–36.0)
MCV: 91.7 fL (ref 80.0–100.0)
MPV: 9.6 fL (ref 7.5–12.5)
Monocytes Relative: 12.2 %
Neutro Abs: 2462 cells/uL (ref 1500–7800)
Neutrophils Relative %: 51.3 %
Platelets: 309 10*3/uL (ref 140–400)
RBC: 3.98 10*6/uL (ref 3.80–5.10)
RDW: 13.3 % (ref 11.0–15.0)
Total Lymphocyte: 32.1 %
WBC: 4.8 10*3/uL (ref 3.8–10.8)

## 2018-02-27 LAB — TSH: TSH: 0.41 mIU/L (ref 0.40–4.50)

## 2018-02-27 LAB — MAGNESIUM: MAGNESIUM: 2 mg/dL (ref 1.5–2.5)

## 2018-02-27 NOTE — Telephone Encounter (Signed)
Please let patient know also that I checked with nutrition; they do in fact see patients other than just diabetics; the nutritionist said she would reach out again to the patient to talk with her and offer services

## 2018-02-27 NOTE — Telephone Encounter (Signed)
CMA filled out paperwork, I have signed it; ready to fax

## 2018-02-27 NOTE — Progress Notes (Signed)
Joelene Millin, please let the patient know that her labs overall look very good; GI panel is pending Cholesterol is high, with a risk of having a heart attack or stroke in the next 10 years just under 10%; I'll recommend a statin like atorvastatin; I don't see an allergy to any statins, but please double check with her before I send in a prescription; ask about any previous experiences with statins The 10-year ASCVD risk score Mikey Bussing DC Brooke Bonito., et al., 2013) is: 9.9%   Values used to calculate the score:     Age: 68 years     Sex: Female     Is Non-Hispanic African American: No     Diabetic: No     Tobacco smoker: No     Systolic Blood Pressure: 373 mmHg     Is BP treated: Yes     HDL Cholesterol: 60 mg/dL     Total Cholesterol: 171 mg/dL

## 2018-02-27 NOTE — Chronic Care Management (AMB) (Signed)
  Chronic Care Management   Note  02/27/2018 Name: Angela [name] MRN: [id number] DOB: 1950-08-02  Care Coordination: CCM RN CM was consulted again yesterday by PCP and staff for assistance with determining if patient is eligible for personal care services through her Medicaid benefits. After speaking with Hassan Rowan at the [facility], it is determined patient may be eligible for benefit if medically necessary. Form DMA-3051 with written instructions has been provided to PCP, Dr. Enid Derry for completion.  CCM RN CM will remain available for consult and to patient as needed.    Mande Auvil E. Rollene Rotunda, RN, BSN Nurse Care Coordinator [facility] Care Management  [phone]

## 2018-02-27 NOTE — Telephone Encounter (Signed)
Assessment for Personal Care services completed on 02/27/18 and placed on [employer].

## 2018-02-28 DIAGNOSIS — G5603 Carpal tunnel syndrome, bilateral upper limbs: Secondary | ICD-10-CM | POA: Diagnosis not present

## 2018-02-28 DIAGNOSIS — G4733 Obstructive sleep apnea (adult) (pediatric): Secondary | ICD-10-CM | POA: Diagnosis not present

## 2018-02-28 DIAGNOSIS — M545 Low back pain: Secondary | ICD-10-CM | POA: Diagnosis not present

## 2018-02-28 DIAGNOSIS — G8929 Other chronic pain: Secondary | ICD-10-CM | POA: Diagnosis not present

## 2018-02-28 DIAGNOSIS — M50321 Other cervical disc degeneration at C4-C5 level: Secondary | ICD-10-CM | POA: Diagnosis not present

## 2018-02-28 DIAGNOSIS — M542 Cervicalgia: Secondary | ICD-10-CM | POA: Diagnosis not present

## 2018-02-28 NOTE — Assessment & Plan Note (Signed)
I am aware patient has mental illness; she has alienated others in the past with her behavior; she is quite angry today; I have encouraged my staff to keep this in mind when we deal with her, to try to understand and extend grace when she is confrontational or angry or demanding; she also tends to put things in extremes which I try to keep in mind; we contacted company about the PCS and I also reached out to nutrition for her

## 2018-02-28 NOTE — Assessment & Plan Note (Signed)
Working with psychiatrist

## 2018-02-28 NOTE — Telephone Encounter (Signed)
Pt.notified

## 2018-03-01 ENCOUNTER — Ambulatory Visit: Payer: Self-pay | Admitting: *Deleted

## 2018-03-01 DIAGNOSIS — I340 Nonrheumatic mitral (valve) insufficiency: Secondary | ICD-10-CM

## 2018-03-01 DIAGNOSIS — I5189 Other ill-defined heart diseases: Secondary | ICD-10-CM

## 2018-03-01 MED ORDER — ALBUTEROL SULFATE HFA 108 (90 BASE) MCG/ACT IN AERS: 2.0000 | INHALATION_SPRAY | RESPIRATORY_TRACT | 1 refills | Status: DC | PRN

## 2018-03-01 MED ORDER — OSELTAMIVIR PHOSPHATE 75 MG PO CAPS: 75.0000 mg | ORAL_CAPSULE | Freq: Two times a day (BID) | ORAL | 0 refills | Status: DC

## 2018-03-01 MED ORDER — FAMOTIDINE 20 MG PO TABS: 20.0000 mg | ORAL_TABLET | Freq: Two times a day (BID) | ORAL | 2 refills | Status: DC | PRN

## 2018-03-01 NOTE — Addendum Note (Signed)
Addended by: Virgil Slinger, Satira Anis on: 03/01/2018 05:15 PM   Modules accepted: Orders

## 2018-03-01 NOTE — Telephone Encounter (Signed)
Pt was very upset, Pt told message would be forwarded to Dr. Sanda Klein

## 2018-03-01 NOTE — Telephone Encounter (Signed)
Patient called office with complaints of increased swelling in her legs- she states she is feverish. Patient is very aggitated and does not want to answer questions about her symptoms. Call to office- she wants Dr Sanda Klein to talk to her- call transferred to [facility].  Answer Assessment - Initial Assessment Questions 1. ONSET: "When did the swelling start?" (e.g., minutes, hours, days)     Yesterday- patient went to [facility]- she has been on feet a lot- swelling is worse today 2. LOCATION: "What part of the leg is swollen?"  "Are both legs swollen or just one leg?"     Both legs- calf- gigantic- on left side  3. SEVERITY: "How bad is the swelling?" (e.g., localized; mild, moderate, severe)  - Localized - small area of swelling localized to one leg  - MILD pedal edema - swelling limited to foot and ankle, pitting edema < 1/4 inch (6 mm) deep, rest and elevation eliminate most or all swelling  - MODERATE edema - swelling of lower leg to knee, pitting edema > 1/4 inch (6 mm) deep, rest and elevation only partially reduce swelling  - SEVERE edema - swelling extends above knee, facial or hand swelling present      severe 4. REDNESS: "Does the swelling look red or infected?"     *No Answer* 5. PAIN: "Is the swelling painful to touch?" If so, ask: "How painful is it?"   (Scale 1-10; mild, moderate or severe)     *No Answer* 6. FEVER: "Do you have a fever?" If so, ask: "What is it, how was it measured, and when did it start?"      Yes- chill 7. CAUSE: "What do you think is causing the leg swelling?"     *No Answer* 8. MEDICAL HISTORY: "Do you have a history of heart failure, kidney disease, liver failure, or cancer?"     *No Answer* 9. RECURRENT SYMPTOM: "Have you had leg swelling before?" If so, ask: "When was the last time?" "What happened that time?"     *No Answer* 10. OTHER SYMPTOMS: "Do you have any other symptoms?" (e.g., chest pain, difficulty breathing)       *No Answer* 11. PREGNANCY: "Is there any  chance you are pregnant?" "When was your last menstrual period?"       *No Answer*  Protocols used: LEG SWELLING AND EDEMA-A-AH

## 2018-03-01 NOTE — Telephone Encounter (Addendum)
I called the patient She thinks she might have the flu Her calves are swollen and shiny, shiny, really bad She is not going out, does not want to go to ER or urgent care (but I did advise that is my recommendation because I can't tell her if she has CHF or DVT or cellulitis or other condition over the phone) Both legs, but left leg worse SHOB for months, no worse with the leg changes though Doesn't think heart failure, grade 1 Diastolic function, mitral regurg, hiatal hernia compressing right atrium on last echo Hiatal hernia, she was advised to have surgery years ago but the recovery sounds rough she says Discussed chance of DVTs, wearing boot on the right, left is worse Shiny skin but not red focal spot, no oozing or weeping Just a little salt, has the SIADH I offered the Tamiflu empirically At [facility] and our office, so may have picked up flu Not eat 3 hours before bed Echo ordered Elevate legs above hips occasionally Ace wraps toe to knee, unwrap every few hour or two She thanked me for the call and wished me a happy Valentine's Day

## 2018-03-05 ENCOUNTER — Ambulatory Visit: Payer: Self-pay | Admitting: Pharmacist

## 2018-03-05 DIAGNOSIS — Z5181 Encounter for therapeutic drug level monitoring: Secondary | ICD-10-CM

## 2018-03-05 DIAGNOSIS — Z79899 Other long term (current) drug therapy: Secondary | ICD-10-CM

## 2018-03-05 NOTE — Chronic Care Management (AMB) (Signed)
  Chronic Care Management   Note  03/05/2018 Name: Angela [name] MRN: [id number] DOB: Aug 17, 1950   Unsuccessful outreach to [name], patient of Dr. Enid Derry. Dr. Sanda Klein routed lab results to me via inbasket message regarding a patient's question about her new prescription statin medication and any drug interactions it may have with her current medication regimen.   Was unable to reach patient via telephone today and have left HIPAA compliant voicemail asking patient to return my call. (unsuccessful outreach #1).    Follow up plan: The CM team will reach out to the patient again over the next 7 days.   [name], PharmD Clinical Pharmacist [facility] [phone]

## 2018-03-06 ENCOUNTER — Ambulatory Visit: Payer: Medicare Other | Admitting: Occupational Therapy

## 2018-03-07 ENCOUNTER — Ambulatory Visit: Payer: Self-pay | Admitting: Family Medicine

## 2018-03-07 DIAGNOSIS — M3501 Sicca syndrome with keratoconjunctivitis: Secondary | ICD-10-CM | POA: Diagnosis not present

## 2018-03-08 ENCOUNTER — Telehealth: Payer: Self-pay

## 2018-03-08 NOTE — Telephone Encounter (Signed)
Spoke with Trish Fountain in regards to the home health referrals. She stated that since the patient is NOT home bound, she does NOT qualify for a home health aide. She has sent over the paperwork for a personal care aide through [facility] and is waiting on them, Truddie Crumble stated she will follow up with them today or on Monday and advise on the next step.     Copied from [place] [phone]. Topic: Referral - Status >> Mar 08, 2018  1:15 PM Vernona Rieger wrote: Reason for CRM: Melissa with [facility] called and stated she received a referral for her for a home health aid. She said they do not have staff to cover the request and the papers also stated that the pt requested [facility] so she is unsure why it came to them as well.

## 2018-03-11 ENCOUNTER — Telehealth: Payer: Self-pay

## 2018-03-11 ENCOUNTER — Other Ambulatory Visit: Payer: Self-pay

## 2018-03-11 DIAGNOSIS — E781 Pure hyperglyceridemia: Secondary | ICD-10-CM

## 2018-03-11 NOTE — Telephone Encounter (Signed)
Scheduled an appt for the patient. Pt states her last does of doxy is today, she said the two of you discussed starting a statin once that was complete, she would like to know what the plan is. Also asked if she should see a cardiologist at [facility] since she isn't able to get into a cardiologist in town until May 2020. Please advise.     Copied from [place] [id number]. Topic: General - Other >> Mar 11, 2018  9:46 AM Yvette Rack wrote: Reason for CRM: Pt called to reschedule the appt that was missed last week. Attempted to schedule appt but pt declined first available appt with Dr. Sanda Klein on 03/22/18. Pt stated she needs to speak with the office. Pt requests call back. Cb# [phone] >> Mar 11, 2018  9:56 AM Myatt, Marland Kitchen wrote: Pt was called back to get R/S. Pt was offered 03/22/2018, first available with Dr Sanda Klein. Pt declined to wait that long and would like a call back as to the soonest she can get in. Also would like to ask questions about her medications. Please advise.

## 2018-03-11 NOTE — Telephone Encounter (Signed)
Copied from [place] [phone]. Topic: General - Other >> Mar 11, 2018  9:46 AM Yvette Rack wrote: Reason for CRM: Pt called to reschedule the appt that was missed last week. Attempted to schedule appt but pt declined first available appt with Dr. Sanda Klein on 03/22/18. Pt stated she needs to speak with the office. Pt requests call back. Cb# [phone] >> Mar 11, 2018  9:56 AM Myatt, Marland Kitchen wrote: Pt was called back to get R/S. Pt was offered 03/22/2018, first available with Dr Sanda Klein. Pt declined to wait that long and would like a call back as to the soonest she can get in. Also would like to ask questions about her medications. Please advise.

## 2018-03-11 NOTE — Telephone Encounter (Signed)
Copied from [place] [phone]. Topic: General - Other >> Mar 11, 2018  9:46 AM Yvette Rack wrote: Reason for CRM: Pt called to reschedule the appt that was missed last week. Attempted to schedule appt but pt declined first available appt with Dr. Sanda Klein on 03/22/18. Pt stated she needs to speak with the office. Pt requests call back. Cb# [phone] >> Mar 11, 2018  9:56 AM Myatt, Marland Kitchen wrote: Pt was called back to get R/S. Pt was offered 03/22/2018, first available with Dr Sanda Klein. Pt declined to wait that long and would like a call back as to the soonest she can get in. Also would like to ask questions about her medications. Please advise.

## 2018-03-12 ENCOUNTER — Ambulatory Visit: Payer: Self-pay | Admitting: Pharmacist

## 2018-03-12 ENCOUNTER — Encounter: Payer: Self-pay | Admitting: Occupational Therapy

## 2018-03-12 ENCOUNTER — Ambulatory Visit: Payer: Medicare Other | Attending: Family Medicine | Admitting: Occupational Therapy

## 2018-03-12 ENCOUNTER — Ambulatory Visit: Payer: Self-pay

## 2018-03-12 ENCOUNTER — Other Ambulatory Visit: Payer: Self-pay

## 2018-03-12 DIAGNOSIS — M79641 Pain in right hand: Secondary | ICD-10-CM | POA: Diagnosis not present

## 2018-03-12 DIAGNOSIS — R200 Anesthesia of skin: Secondary | ICD-10-CM | POA: Diagnosis not present

## 2018-03-12 DIAGNOSIS — Z5181 Encounter for therapeutic drug level monitoring: Secondary | ICD-10-CM

## 2018-03-12 DIAGNOSIS — M25531 Pain in right wrist: Secondary | ICD-10-CM | POA: Diagnosis not present

## 2018-03-12 DIAGNOSIS — M79642 Pain in left hand: Secondary | ICD-10-CM | POA: Diagnosis not present

## 2018-03-12 DIAGNOSIS — M6281 Muscle weakness (generalized): Secondary | ICD-10-CM

## 2018-03-12 DIAGNOSIS — E039 Hypothyroidism, unspecified: Secondary | ICD-10-CM

## 2018-03-12 DIAGNOSIS — R202 Paresthesia of skin: Secondary | ICD-10-CM | POA: Insufficient documentation

## 2018-03-12 DIAGNOSIS — M25532 Pain in left wrist: Secondary | ICD-10-CM | POA: Diagnosis not present

## 2018-03-12 DIAGNOSIS — Z79899 Other long term (current) drug therapy: Secondary | ICD-10-CM

## 2018-03-12 DIAGNOSIS — I10 Essential (primary) hypertension: Secondary | ICD-10-CM

## 2018-03-12 DIAGNOSIS — F4310 Post-traumatic stress disorder, unspecified: Secondary | ICD-10-CM

## 2018-03-12 DIAGNOSIS — G3184 Mild cognitive impairment, so stated: Secondary | ICD-10-CM

## 2018-03-12 MED ORDER — DICLOFENAC SODIUM 1 % TD GEL: TRANSDERMAL | 1 refills | Status: DC

## 2018-03-12 MED ORDER — LEVOTHYROXINE SODIUM 100 MCG PO TABS: 100.0000 ug | ORAL_TABLET | Freq: Every day | ORAL | 0 refills | Status: DC

## 2018-03-12 MED ORDER — ATORVASTATIN CALCIUM 20 MG PO TABS: 20.0000 mg | ORAL_TABLET | Freq: Every day | ORAL | 1 refills | Status: DC

## 2018-03-12 MED ORDER — ICOSAPENT ETHYL 1 G PO CAPS: ORAL_CAPSULE | ORAL | 0 refills | Status: DC

## 2018-03-12 NOTE — Patient Instructions (Signed)
Wear wrist splints most all the time - off with HEP and ADl's   Contrast 2-3 x day for hands and wrists Joint protection - use larger joints and avoid tight and sustained grip

## 2018-03-12 NOTE — Chronic Care Management (AMB) (Signed)
  Chronic Care Management   Follow Up Note   03/12/2018 Name: Angela [name] MRN: [id number] DOB: 1950/01/28  Referred by: Arnetha Courser, MD Reason for referral : Chronic Care Management (care coordination)  Care Coordination: Successful telephone call to [facility] to follow up recent request/referral for personal care services. Per Angela [name], referral has been received and patient was scheduled to have assessment completed yesterday 03/11/2018. Angela [name] is unsure if assessment was completed as no documentation is available at this time.  CCM RN CM attempted to follow up with [name] however mailbox was full.   The CM team will reach out to the patient again over the next 7 days.      Arieon Corcoran E. Rollene Rotunda, RN, BSN Nurse Care Coordinator [facility] Care Management  [phone]

## 2018-03-12 NOTE — Telephone Encounter (Signed)
Lab Results  Component Value Date   TSH 0.41 02/26/2018   T4TOTAL 9.2 03/30/2016   Limited thyroid med, will see what weight is doing and consider rechecking in a few months  Okay to start lipid medicine once she is off of doxy I'll send in Rx Recheck lipids and TSH (please ORDER) in 6-8 weeks  Please work with Lenna Sciara to have her see cardiologist somewhere sooner than May; I'd like her seen in the next week or two; thank you

## 2018-03-12 NOTE — Therapy (Signed)
[place] [facility] [address] [place], [address] Phone: [phone]   Fax:  [phone]  Occupational Therapy Evaluation  Patient Details  Name: Angela [name] MRN: [id number] Date of Birth: 26-Sep-1950 Referring Provider (OT): Enid Derry   Encounter Date: 03/12/2018  OT End of Session - 03/12/18 1840    Visit Number  1    Number of Visits  4    Date for OT Re-Evaluation  04/09/18    OT Start Time  1346    OT Stop Time  1446    OT Time Calculation (min)  60 min    Activity Tolerance  Patient tolerated treatment well    Behavior During Therapy  [facility] for tasks assessed/performed       Past Medical History:  Diagnosis Date  . Allergic rhinitis   . Anemia   . Blurred vision   . Depression   . Diverticulosis   . Endometriosis   . Falls   . GERD (gastroesophageal reflux disease)   . Hernia, hiatal   . Hypothyroidism   . IBS (irritable bowel syndrome)   . Malignant neoplasm of skin   . Migraine   . Neuropathy   . PTSD (post-traumatic stress disorder)   . Thyroid disease     Past Surgical History:  Procedure Laterality Date  . APPENDECTOMY    . CHOLECYSTECTOMY    . CHOLECYSTECTOMY, LAPAROSCOPIC    . ESOPHAGOGASTRODUODENOSCOPY (EGD) WITH PROPOFOL N/A 03/29/2015   Procedure: ESOPHAGOGASTRODUODENOSCOPY (EGD) WITH PROPOFOL;  Surgeon: Josefine Class, MD;  Location: [facility] ENDOSCOPY;  Service: Endoscopy;  Laterality: N/A;  . HERNIA REPAIR    . laproscopy    . TONSILLECTOMY    . uteral suspension      There were no vitals filed for this visit.  Subjective Assessment - 03/12/18 1827    Subjective   My hands got worse since I seen you 2 years ago - my carpal tunnel got worse and had arthritis in my hands now , they are swollen in the am and numbness - but during day they also goes numbn and hte pain is bad - night time and during day time - pain worse in the R and numbness worse in the L arm     Patient Stated Goals  To get my hands better , pain , swelling and numbness so I can  use them again - also  I lost my splints     Currently in Pain?  Yes    Pain Score  8     Pain Location  Hand    Pain Orientation  Right;Left    Pain Descriptors / Indicators  Numbness;Shooting;Sharp;Tender;Aching    Pain Type  Chronic pain    Pain Onset  More than a month ago    Pain Frequency  Constant        [facility] OT Assessment - 03/12/18 0001      Assessment   Medical Diagnosis  bilateral CTS    Referring Provider (OT)  Rip Harbour Lada    Hand Dominance  Right      Precautions   Required Braces or Orthoses  --   Fitted pt with bilateral wrist splints      Home  Environment   Lives With  Alone      Prior Function   Vocation  On disability    Leisure  R hand dominant , reading, playing guitar, piano, photography , travel  Strength   Right Hand Grip (lbs)  38    Right Hand Lateral Pinch  16 lbs    Right Hand 3 Point Pinch  8 lbs    Left Hand Grip (lbs)  30    Left Hand Lateral Pinch  16 lbs    Left Hand 3 Point Pinch  10 lbs      contrast done to bilateral hands - pain decrease   pt to do at home the same 2-3 x day   fitted with bilateral prefab wrist splints to wear most all the time  And off during ADL's  and joint protection education done - hand out provided                 OT Education - 03/12/18 1840    Education provided  Yes    Education Details  findings of eval , compare to 2 yrs ago -and HEP     Person(s) Educated  Patient    Methods  Explanation;Demonstration;Verbal cues;Handout    Comprehension  Verbalized understanding;Returned demonstration       OT Short Term Goals - 03/12/18 1850      OT SHORT TERM GOAL #1   Title  Pain on PRHWE improve by at least 15 points     Baseline  pain at [facility] on [facility] 39/50    Time  3    Period  Weeks    Status  New    Target Date  04/02/18        OT Long Term Goals - 03/12/18 1851      OT LONG TERM GOAL  #1   Title  Pt to be independent in HEP and including wearing splints to decrease pain and edema     Baseline  lost her splints and forgot her HEP from 2 yrs ago     Time  4    Period  Weeks    Status  New    Target Date  04/09/18      OT LONG TERM GOAL #2   Title  Grip strength in L hand increase with more than 5 lbs to return to her prior level of 2 yrs ago     Time  4    Period  Weeks    Status  New    Target Date  04/09/18            Plan - 03/12/18 1841    Clinical Impression Statement  Pt was refer for bilateral CTS - she report she has arthritis and seeing spine specialist for her neck - pt was seen by this OT about 2 yrs ago and compare to then her pain is increase again but AROM in digits and wrist WNL , grip is decrease in L hand but R the same   and then her prehension grip is actually increase in bilateral hands except 3 point on R - pt is tender over all A1pulleys -including thumbs and report her PIP's and volar wrist /thumbs are the worse - with being 5-8/10 pain , numbness at night time the whole L arm  and L hand worse than R - Patient had in past bilateral upper extremity numbness. [place] suggested bilateral carpal tunnel syndrome. she had shots couple of times since I have seen her .  A EMG would be helpful to understand if she has additional compressive neuropathies of the upper extremity in addition to her carpal tunnel. This would help in differentiating cubital tunnel syndrome  versus cervical radiculopath- Pt can benefit from OT services to decrese pain , edema and numbness    Occupational performance deficits (Please refer to evaluation for details):  ADL's;IADL's;Rest and Sleep;Leisure    Rehab Potential  Fair    Current Impairments/barriers affecting progress:  Long standing CT symptoms , and cervical issues     OT Frequency  1x / week    OT Duration  4 weeks    OT Treatment/Interventions  Self-care/ADL training;Ultrasound;Contrast Bath;DME and/or AE  instruction;Splinting;Manual Therapy;Therapeutic activities;Patient/family education    Plan  assess progress with HEP     Clinical Decision Making  Several treatment options, min-mod task modification necessary    OT Home Exercise Plan  see pt instruction    Consulted and Agree with Plan of Care  Patient       Patient will benefit from skilled therapeutic intervention in order to improve the following deficits and impairments:  Increased edema, Impaired sensation, Impaired UE functional use, Pain  Visit Diagnosis: Pain in left wrist - Plan: Ot plan of care cert/re-cert  Pain in right wrist - Plan: Ot plan of care cert/re-cert  Pain in left hand - Plan: Ot plan of care cert/re-cert  Pain in right hand - Plan: Ot plan of care cert/re-cert  Numbness and tingling in both hands - Plan: Ot plan of care cert/re-cert  Muscle weakness (generalized) - Plan: Ot plan of care cert/re-cert    Problem List Patient Active Problem List   Diagnosis Date Noted  . Axis II diagnosis deferred 02/20/2018  . Preventative health care 02/08/2018  . Polypharmacy 02/01/2018  . Personality disorder ([place]) 12/25/2017  . Noncompliance 12/25/2017  . Medication monitoring encounter 12/07/2017  . Family history of high cholesterol 12/07/2017  . Arthritis of hand, degenerative 12/07/2017  . Bipolar I disorder, most recent episode mixed, severe with psychotic features ([place]) 09/18/2017  . Agitation 09/18/2017  . Depressed mood 07/11/2017  . Hallux valgus, acquired 07/05/2017  . Hammer toes of both feet 07/05/2017  . Unstable ankle 07/05/2017  . Chronic bilateral low back pain without sciatica 06/09/2017  . Tinea unguium 05/08/2017  . Unresolved grief 05/08/2017  . Conductive hearing loss of right ear with unrestricted hearing of left ear 04/03/2017  . Adjustment disorder with depressed mood 01/18/2017  . Migraine 01/18/2017  . Postmenopausal bleeding 01/18/2017  . IBS (irritable bowel syndrome)  11/17/2016  . Bilateral carpal tunnel syndrome 11/17/2016  . Plantar fasciitis, bilateral 11/17/2016  . Cervical myofascial pain syndrome 11/17/2016  . Chronic hyponatremia 11/17/2016  . PTSD (post-traumatic stress disorder) 11/17/2016  . Hernia, hiatal 11/17/2016  . Vitamin D deficiency 11/17/2016  . Vitamin B12 deficiency 11/17/2016  . SIADH (syndrome of inappropriate ADH production) ([place]) 09/08/2016  . Cervical spondylosis with myelopathy 05/24/2016  . Post-concussion headache 05/24/2016  . MCI (mild cognitive impairment) with memory loss 03/30/2016  . Gait abnormality 03/30/2016  . Chronic bipolar affective disorder ([place]) 01/24/2016  . Recurrent major depressive disorder, in partial remission ([place]) 01/24/2016  . Anemia, iron deficiency 06/24/2015  . Endometriosis 06/24/2015  . Essential hypertension 06/24/2015  . History of falling 05/22/2015  . History of TIA (transient ischemic attack) 04/20/2015  . Hypothyroidism 04/20/2015  . GERD (gastroesophageal reflux disease) 04/20/2015  . Cervical disc disorder at C5-C6 level with radiculopathy 03/25/2015  . Chronic neck pain 03/25/2015  . Pelvic pain in female 10/30/2014  . History of skin cancer 06/30/2014  . Chronic sinusitis 06/30/2014  . Chronic headaches 12/16/2013  . Neuropathy 12/16/2013  Rosalyn Gess OTR/l,CLT 03/12/2018, 7:17 PM  [place] [facility] [address] [place], [address] Phone: [phone]   Fax:  [phone]  Name: Angela [name] MRN: [id number] Date of Birth: 20-Jul-1950

## 2018-03-12 NOTE — Chronic Care Management (AMB) (Signed)
  Chronic Care Management   Note  03/12/2018 Name: Tacha Manni MRN: [id number] DOB: 06-17-1950  68 y.o. year old female referred to Chronic Care Management by Dr. Enid Derry for patient's medication question regarding statin DDI.   Was unable to reach patient via telephone today and unfortunately voicemail was full and unable to accept messages. (unsuccessful outreach #2).  [name], PharmD Clinical Pharmacist [facility] [phone]

## 2018-03-14 NOTE — Telephone Encounter (Signed)
Left detailed VM. CRM created. Labs ordered.  

## 2018-03-15 ENCOUNTER — Encounter: Payer: Self-pay | Admitting: Family Medicine

## 2018-03-15 DIAGNOSIS — J449 Chronic obstructive pulmonary disease, unspecified: Secondary | ICD-10-CM

## 2018-03-15 DIAGNOSIS — J9801 Acute bronchospasm: Secondary | ICD-10-CM | POA: Insufficient documentation

## 2018-03-19 ENCOUNTER — Ambulatory Visit: Payer: Medicare Other | Attending: Family Medicine | Admitting: Occupational Therapy

## 2018-03-19 DIAGNOSIS — R200 Anesthesia of skin: Secondary | ICD-10-CM | POA: Diagnosis not present

## 2018-03-19 DIAGNOSIS — M25531 Pain in right wrist: Secondary | ICD-10-CM | POA: Diagnosis not present

## 2018-03-19 DIAGNOSIS — M6281 Muscle weakness (generalized): Secondary | ICD-10-CM

## 2018-03-19 DIAGNOSIS — M79641 Pain in right hand: Secondary | ICD-10-CM | POA: Insufficient documentation

## 2018-03-19 DIAGNOSIS — M79642 Pain in left hand: Secondary | ICD-10-CM | POA: Insufficient documentation

## 2018-03-19 DIAGNOSIS — R202 Paresthesia of skin: Secondary | ICD-10-CM | POA: Diagnosis not present

## 2018-03-19 DIAGNOSIS — M25532 Pain in left wrist: Secondary | ICD-10-CM | POA: Diagnosis not present

## 2018-03-19 NOTE — Therapy (Signed)
[place] [facility] [address], [place], [address] Phone: [phone]   Fax:  [phone]  Occupational Therapy Treatment  Patient Details  Name: Angela [name] MRN: [id number] Date of Birth: 04-11-1950 Referring Provider (OT): Enid Derry   Encounter Date: 03/19/2018  OT End of Session - 03/19/18 1629    Visit Number  2    Number of Visits  4    Date for OT Re-Evaluation  04/09/18    OT Start Time  1345    OT Stop Time  1429    OT Time Calculation (min)  44 min    Activity Tolerance  Patient tolerated treatment well    Behavior During Therapy  [facility] for tasks assessed/performed       Past Medical History:  Diagnosis Date  . Allergic rhinitis   . Anemia   . Blurred vision   . Depression   . Diverticulosis   . Endometriosis   . Falls   . GERD (gastroesophageal reflux disease)   . Hernia, hiatal   . Hypothyroidism   . IBS (irritable bowel syndrome)   . Malignant neoplasm of skin   . Migraine   . Neuropathy   . PTSD (post-traumatic stress disorder)   . Thyroid disease     Past Surgical History:  Procedure Laterality Date  . APPENDECTOMY    . CHOLECYSTECTOMY    . CHOLECYSTECTOMY, LAPAROSCOPIC    . ESOPHAGOGASTRODUODENOSCOPY (EGD) WITH PROPOFOL N/A 03/29/2015   Procedure: ESOPHAGOGASTRODUODENOSCOPY (EGD) WITH PROPOFOL;  Surgeon: Josefine Class, MD;  Location: [facility] ENDOSCOPY;  Service: Endoscopy;  Laterality: N/A;  . HERNIA REPAIR    . laproscopy    . TONSILLECTOMY    . uteral suspension      There were no vitals filed for this visit.  Subjective Assessment - 03/19/18 1626    Subjective   The pain is still the same in my all my joints -but the numbness is better - I did wear the splints - but I cooked and did not do the dishes or unpack because my hands hurt to much after cooking     Patient Stated Goals  To get my hands better , pain , swelling and numbness so I can  use them again -  also  I lost my splints     Currently in Pain?  Yes    Pain Score  8     Pain Location  Hand    Pain Orientation  Right;Left    Pain Descriptors / Indicators  Aching    Pain Type  Chronic pain        pt denies any numbness coming in and at night time it was better with splints  But report increase edema in hands at night time  Do wear prefab wrist splints most all the time - fitted last week by OT            OT Treatments/Exercises (OP) - 03/19/18 0001      Ultrasound   Ultrasound Location  bilateral CT    Ultrasound Parameters  3.3MHZ, 20%, 0.8 intensity     Ultrasound Goals  Pain      RUE Paraffin   Number Minutes Paraffin  10 Minutes    RUE Paraffin Location  Hand    Comments  decrease pain prior to soft tissue       LUE Paraffin   Number Minutes Paraffin  10 Minutes    LUE Paraffin  Location  Hand    Comments  decrease pain prior to soft tissue        Done this date Paraffin prior to soft tissue mobs by OT- pain decrease to 0/10  CT and MC spreads done  And graston tool nr 2 done on volar forearms and palm prior to tendon glides  Pt did had increase pain with tendon glides- remind gentle AROM - pain free   felt more on ulnar side  Will assess further next time   US done at end of session     OT Education - 03/19/18 1629    Education provided  Yes    Education Details  progress in numbness but cont with splint wearing and decrease pain     Person(s) Educated  Patient    Methods  Explanation;Demonstration;Verbal cues;Handout    Comprehension  Verbalized understanding;Returned demonstration       OT Short Term Goals - 03/12/18 1850      OT SHORT TERM GOAL #1   Title  Pain on PRHWE improve by at least 15 points     Baseline  pain at [facility] on [facility] 39/50    Time  3    Period  Weeks    Status  New    Target Date  04/02/18        OT Long Term Goals - 03/12/18 1851      OT LONG TERM GOAL #1   Title  Pt to be independent in HEP and including  wearing splints to decrease pain and edema     Baseline  lost her splints and forgot her HEP from 2 yrs ago     Time  4    Period  Weeks    Status  New    Target Date  04/09/18      OT LONG TERM GOAL #2   Title  Grip strength in L hand increase with more than 5 lbs to return to her prior level of 2 yrs ago     Time  4    Period  Weeks    Status  New    Target Date  04/09/18            Plan - 03/19/18 1630    Clinical Impression Statement  Pt has been wearing her wrist splints since week ago -and report numbness at night time better and today better - no numbness coming in - but pain still increase - pain did decrease to 0/10 after parafffin     Occupational performance deficits (Please refer to evaluation for details):  ADL's;IADL's;Rest and Sleep;Leisure    Rehab Potential  Fair    Clinical Decision Making  Several treatment options, min-mod task modification necessary    OT Frequency  1x / week    OT Duration  4 weeks    OT Treatment/Interventions  Self-care/ADL training;Ultrasound;Contrast Bath;DME and/or AE instruction;Splinting;Manual Therapy;Therapeutic activities;Patient/family education    Plan  assess progress with HEP     OT Home Exercise Plan  see pt instruction    Consulted and Agree with Plan of Care  Patient       Patient will benefit from skilled therapeutic intervention in order to improve the following deficits and impairments:     Visit Diagnosis: Pain in left wrist  Pain in right wrist  Pain in left hand  Pain in right hand  Numbness and tingling in both hands  Muscle weakness (generalized)    Problem List Patient Active Problem  List   Diagnosis Date Noted  . COPD (chronic obstructive pulmonary disease) ([place]) 03/15/2018  . Bronchospasm 03/15/2018  . Axis II diagnosis deferred 02/20/2018  . Preventative health care 02/08/2018  . Polypharmacy 02/01/2018  . Personality disorder ([place]) 12/25/2017  . Noncompliance 12/25/2017  . Medication  monitoring encounter 12/07/2017  . Family history of high cholesterol 12/07/2017  . Arthritis of hand, degenerative 12/07/2017  . Bipolar I disorder, most recent episode mixed, severe with psychotic features ([place]) 09/18/2017  . Agitation 09/18/2017  . Depressed mood 07/11/2017  . Hallux valgus, acquired 07/05/2017  . Hammer toes of both feet 07/05/2017  . Unstable ankle 07/05/2017  . Chronic bilateral low back pain without sciatica 06/09/2017  . Tinea unguium 05/08/2017  . Unresolved grief 05/08/2017  . Conductive hearing loss of right ear with unrestricted hearing of left ear 04/03/2017  . Adjustment disorder with depressed mood 01/18/2017  . Migraine 01/18/2017  . Postmenopausal bleeding 01/18/2017  . IBS (irritable bowel syndrome) 11/17/2016  . Bilateral carpal tunnel syndrome 11/17/2016  . Plantar fasciitis, bilateral 11/17/2016  . Cervical myofascial pain syndrome 11/17/2016  . Chronic hyponatremia 11/17/2016  . PTSD (post-traumatic stress disorder) 11/17/2016  . Hernia, hiatal 11/17/2016  . Vitamin D deficiency 11/17/2016  . Vitamin B12 deficiency 11/17/2016  . SIADH (syndrome of inappropriate ADH production) ([place]) 09/08/2016  . Cervical spondylosis with myelopathy 05/24/2016  . Post-concussion headache 05/24/2016  . MCI (mild cognitive impairment) with memory loss 03/30/2016  . Gait abnormality 03/30/2016  . Chronic bipolar affective disorder ([place]) 01/24/2016  . Recurrent major depressive disorder, in partial remission ([employer]) 01/24/2016  . Anemia, iron deficiency 06/24/2015  . Endometriosis 06/24/2015  . Essential hypertension 06/24/2015  . History of falling 05/22/2015  . History of TIA (transient ischemic attack) 04/20/2015  . Hypothyroidism 04/20/2015  . GERD (gastroesophageal reflux disease) 04/20/2015  . Cervical disc disorder at C5-C6 level with radiculopathy 03/25/2015  . Chronic neck pain 03/25/2015  . Pelvic pain in female 10/30/2014  . History of skin cancer  06/30/2014  . Chronic sinusitis 06/30/2014  . Chronic headaches 12/16/2013  . Neuropathy 12/16/2013    Rosalyn Gess OTR/L,CLT 03/19/2018, 4:33 PM  [place] [facility] [address], [place], [address] Phone: [phone]   Fax:  [phone]  Name: Angela [name] MRN: [id number] Date of Birth: [birth year]

## 2018-03-19 NOTE — Patient Instructions (Signed)
Same HEP   can do tendon glides

## 2018-03-21 ENCOUNTER — Ambulatory Visit (INDEPENDENT_AMBULATORY_CARE_PROVIDER_SITE_OTHER): Payer: Medicare Other

## 2018-03-21 ENCOUNTER — Ambulatory Visit: Payer: Self-pay | Admitting: Pharmacist

## 2018-03-21 ENCOUNTER — Telehealth: Payer: Self-pay | Admitting: Pharmacist

## 2018-03-21 DIAGNOSIS — F319 Bipolar disorder, unspecified: Secondary | ICD-10-CM

## 2018-03-21 DIAGNOSIS — F4310 Post-traumatic stress disorder, unspecified: Secondary | ICD-10-CM

## 2018-03-21 DIAGNOSIS — I10 Essential (primary) hypertension: Secondary | ICD-10-CM | POA: Diagnosis not present

## 2018-03-21 DIAGNOSIS — F3164 Bipolar disorder, current episode mixed, severe, with psychotic features: Secondary | ICD-10-CM

## 2018-03-21 DIAGNOSIS — Z79899 Other long term (current) drug therapy: Secondary | ICD-10-CM

## 2018-03-21 DIAGNOSIS — F609 Personality disorder, unspecified: Secondary | ICD-10-CM

## 2018-03-21 DIAGNOSIS — Z91199 Patient's noncompliance with other medical treatment and regimen due to unspecified reason: Secondary | ICD-10-CM

## 2018-03-21 DIAGNOSIS — Z9119 Patient's noncompliance with other medical treatment and regimen: Secondary | ICD-10-CM

## 2018-03-21 NOTE — Chronic Care Management (AMB) (Signed)
  Chronic Care Management   Follow Up Note   03/21/2018 Name: Angela [name] MRN: [id number] DOB: 04-17-1950  Referred by: Arnetha Courser, MD Reason for referral : Chronic Care Management (follow up on personal care services)  Care Coordination: Successful telephone outreach call to Angela [name] to discuss recent referral for personal care services through her Medicaid.  According to [name], she has been engaged by [facility], provider of home care services for [place] of [place]. [name] states she has been mailed a list of providers and she needs to find a provider that meets her needs, however she can't walk to her apartment mailbox secondary to "I walk with a walker". [name] request a list of providers be faxed from [facility] to providers office for pick up tomorrow during her PCP appointment.  Goals Addressed            This Visit's Progress   . I can't walk to my mailbox to get the list of providers" (pt-stated)       Current Barriers:  . Lacks caregiver support.  . Transportation barriers . Non-adherence to scheduled provider appointments  Nurse Case Manager Clinical Goal(s):  Marland Kitchen Over the next 7 days, patient will verbalize understanding of plan for obtaining a personal care attendent  Interventions:  . Advised patient to utilize resources printed today to find a home care provider that would fit all her needs as directed by [facility]  Patient Self Care Activities:  . Currently UNABLE TO independently ambulate to mailbox  Plan:  . RNCM will print and provide to patient list of approved provider through [employer]  . Patient will utilize list to provide [facility] with her 3 choices for home care   Initial goal documentation         Plan: CCM RN CM will remain available to assist PCP with patient needs.    Kaydee Magel E. Rollene Rotunda, RN, BSN Nurse Care Coordinator [facility] / [facility] Care Management  [phone]

## 2018-03-21 NOTE — Telephone Encounter (Signed)
Dr. Sanda Klein-   Angela [name] is scheduled to come see you tomorrow for an office visit. She is requesting her prescriptions to be sent through [place] ([facility]) from now on.   I thought since she would be seen tomorrow, if you think appropriate, could you send in new prescriptions to [facility]? I have updated patient's preferred pharmacy in the chart and I can place a STOP on your active prescriptions at [facility] so she won't have extra refills.   Thank you!   [name], PharmD Clinical Pharmacist [facility] [phone]

## 2018-03-21 NOTE — Chronic Care Management (AMB) (Signed)
  Chronic Care Management   Note  03/21/2018 Name: Angela [name] MRN: [id number] DOB: Jan 11, 1951  Successful joint outreach with Angela [name], RNCM to Angela [name] today. HIPAA identifiers verified.  #Atorvastatin: Counseled patient on the safety and efficacy of atorvastatin. She can take with or without food, in the AM or PM. Many people like to take cholesterol medicines QHS because our bodies make cholesterol overnight while we sleep. Angela [name] has no DDI interactions with atorvastatin. The "calcium" component refers to how the medication is made into tablets and absorbed into the body. It should not count towards calcium supplementation goals.   #Mail order: Angela [name] would like to utilize [name] for her prescriptions from Angela [name] and Angela [name]. She can continue to use [facility] for her acute medications. She states that although she cannot get to her mailbox for her mail, the mail order pharmacy will deliver to her door in a package and not to her mailbox, so that her difficulties with limited mobility should not be an issue. CCM pharmacist will coordinate with Angela [name] and Raul [name] and STOP prescriptions written by Drs. [name] and Angela [name]   Goals Addressed            This Visit's Progress   . I want Angela [name] (pt-stated)       Current Barriers:  . Diminished manual dexterity  Pharmacist Clinical Goal(s):  [name] Over the next 30 days, patient will demonstrate Improved medication adherence as evidenced by utilizing [name]  Interventions: . Collaboration with Copywriter, advertising community pharmacy   Patient Self Care Activities:  . Self administers medications as prescribed . Attends all scheduled provider appointments . Calls provider office for new concerns or questions  Plan: . PharmD willcollaborate with physicians to switch prescriptions to mail order pharmacy  Initial goal documentation         Follow up  plan: Next PCP appointment scheduled for:  03/22/2018 with Dr. Lorain [name], PharmD Clinical Pharmacist [place] [phone]

## 2018-03-21 NOTE — Telephone Encounter (Signed)
Certainly, thank you.

## 2018-03-22 ENCOUNTER — Ambulatory Visit: Payer: Self-pay | Admitting: Family Medicine

## 2018-03-25 ENCOUNTER — Ambulatory Visit: Payer: Self-pay

## 2018-03-25 DIAGNOSIS — F609 Personality disorder, unspecified: Secondary | ICD-10-CM

## 2018-03-25 DIAGNOSIS — I10 Essential (primary) hypertension: Secondary | ICD-10-CM

## 2018-03-25 DIAGNOSIS — F319 Bipolar disorder, unspecified: Secondary | ICD-10-CM

## 2018-03-25 NOTE — Chronic Care Management (AMB) (Signed)
  Chronic Care Management   Note  03/25/2018 Name: Angela [name] MRN: [id number] DOB: 05-01-1950  Care Coordination: Incoming call from Ms. Marian Sorrow requesting to pick up list of approved providers for personal care assistance through [facility]. CCM RN CM informed Ms. Kady that list was actually available on Friday 3/6/202 and in the hands of providers to be given to patient during scheduled appointment. Unfortunately Ms. Panetta missed her appointment. Ms. Abplanalp states she was sick and notified the providers office that she was not coming in. Ms. Hardie request provider list be available at front desk for pick up today.   Plan: CCM RN CM will have personal care provider list available for pick up at front desk.    Atalya Dano E. Rollene Rotunda, RN, BSN Nurse Care Coordinator [facility] Care Management  [phone]

## 2018-03-26 ENCOUNTER — Ambulatory Visit: Payer: Medicare Other | Admitting: Occupational Therapy

## 2018-03-27 ENCOUNTER — Telehealth: Payer: Self-pay | Admitting: Family Medicine

## 2018-03-27 DIAGNOSIS — R079 Chest pain, unspecified: Secondary | ICD-10-CM

## 2018-03-27 DIAGNOSIS — I5189 Other ill-defined heart diseases: Secondary | ICD-10-CM

## 2018-03-27 DIAGNOSIS — R600 Localized edema: Secondary | ICD-10-CM

## 2018-03-27 DIAGNOSIS — I340 Nonrheumatic mitral (valve) insufficiency: Secondary | ICD-10-CM

## 2018-03-27 NOTE — Telephone Encounter (Signed)
I was unable to get to her phone call today (I had a 6:00 pm meeting at the hospital) I am out on Thursday Please contact the patient and help her however you can

## 2018-03-27 NOTE — Telephone Encounter (Signed)
Copied from [place] [phone]. Topic: General - Inquiry >> Mar 27, 2018  3:55 PM Vernona Rieger wrote: Reason for CRM: pt states she needs to speak with Dr Sanda Klein - it is important and would not give me any other information

## 2018-03-28 NOTE — Telephone Encounter (Signed)
Please let her know that Dr. Sanda Klein is out today and we are happy to address her concerns- if it is something we can manage by phone please elet me know, if not I am happy to see her today, if it is urgent or emergent please direct her to the appropriate facility

## 2018-03-28 NOTE — Telephone Encounter (Signed)
Called patient and asked if there was anything I could help her with. She stated I could not help her and demanded that Dr. Sanda Klein call her. I let her know that Dr. Sanda Klein would be out of the office until tomorrow and she stated she would like for her to call her as soon as she was available.

## 2018-03-29 DIAGNOSIS — I340 Nonrheumatic mitral (valve) insufficiency: Secondary | ICD-10-CM | POA: Insufficient documentation

## 2018-03-29 DIAGNOSIS — I5189 Other ill-defined heart diseases: Secondary | ICD-10-CM | POA: Insufficient documentation

## 2018-03-29 NOTE — Telephone Encounter (Addendum)
I returned her call; her legs are swelling; she did research on line; something is linking back pain, leg edema, digestive problems, unusual and dangerous heart condition, she wasn't able to remember the name; she has not seen a cardiologist yet; cardiologist referral placed 01/28/2018; appt is in May with Dr. Rockey Situ and she said give me the best, which she says is Dr. Rockey Situ, but she doesn't want to wait that long, so she is willing to see someone else sooner outside of [facility] system and not even in [place], please refer to [place] or [facility] or [facility], please get patient in to see a cardiologist ASAP; I'm placing new referral  Angela [name], please check on gastroenterology referral, placed 12/12/2017; please work with patient and do whatever needs to be done, sh was apparently seen at [facility]; referring doctor Tammi Klippel, [place] is Museum/gallery conservator and they are trying to get her to [facility] perhaps?), but she says she hasn't heard anything; Automotive engineer at [place] and facilitate getting her appt made  She discussed excessive magnesium, no more than 200 to 400 mg recommended

## 2018-03-29 NOTE — Addendum Note (Signed)
Addended by: Daelen Belvedere, Satira Anis on: 03/29/2018 02:29 PM   Modules accepted: Orders

## 2018-03-29 NOTE — Assessment & Plan Note (Signed)
Refer to cards. 

## 2018-03-29 NOTE — Assessment & Plan Note (Signed)
Refer to GI 

## 2018-03-29 NOTE — Telephone Encounter (Signed)
Called [facility] spoke with [place] patient was referred and ([facility]) tried contacting patient several times with no response and sent letter out to her.  Also CMA Amber called her to tell her this info and pt was given Phone # to contact ([facility]) to setup GI appt.   Referral message sent to referral coordinator Melissa about ASAP cardio referral.

## 2018-04-01 ENCOUNTER — Telehealth: Payer: Self-pay

## 2018-04-01 NOTE — Telephone Encounter (Signed)
Sent referral to referral coordinator [place]

## 2018-04-01 NOTE — Telephone Encounter (Signed)
Copied from [place] [phone]. Topic: General - Other >> Apr 01, 2018  9:49 AM Carolyn Stare wrote:  Pt would like Dr Sanda Klein nurse to give her a call

## 2018-04-01 NOTE — Telephone Encounter (Signed)
That is just something she has read about; I am not aware of anything in her record that supports that I just need her to get in to see a cardiologist ASAP please I already told her that I will leave it to our referral coordinator to work with cardiology clinic to get her to the appropriate provider

## 2018-04-01 NOTE — Telephone Encounter (Signed)
Patient called states she has the heart condition:ATTR-CM and to make sure we refer her to some one that specializes in this?  Dr. Sanda Klein do you know?

## 2018-04-01 NOTE — Telephone Encounter (Signed)
Called left voicemail ...

## 2018-04-02 ENCOUNTER — Telehealth: Payer: Self-pay | Admitting: Family Medicine

## 2018-04-02 NOTE — Telephone Encounter (Signed)
Copied from [place] [phone]. Topic: Referral - Request for Referral >> Apr 02, 2018  4:25 PM Gustavus Messing wrote: Has patient seen PCP for this complaint? Yes.   *If NO, is insurance requiring patient see PCP for this issue before PCP can refer them? Referral for which specialty: [facility]  Preferred provider/office: Dr. lada Reason for referral: [facility] believes that this patient should be seen at [facility]

## 2018-04-03 DIAGNOSIS — H2511 Age-related nuclear cataract, right eye: Secondary | ICD-10-CM | POA: Diagnosis not present

## 2018-04-03 NOTE — Telephone Encounter (Signed)
[employer] [phone], they state will be calling the patient hopefully by the end of day with an appt.

## 2018-04-03 NOTE — Telephone Encounter (Signed)
Left detailed voicemail, That I had called [facility] to check status of referral for her and they stated they should be contacting her by the end of day with an appt.  I told her to be looking for call or voicemail and also left there phone number to contact them as well.  [facility] [phone].   Also left voicemail about GI referral.  I had spoke with CMA over at [place] and she said that [facility] tried to reach out to her by phone and she never returned calls also they mail her something, which she states never received any of this and it is a he said she said thing.  Angela [name] states she also gave Angela [name] this info as well and their phone number to contact them.  On voicemail today I also left [facility]'s phone number for her to call and re setup appt at [phone].

## 2018-04-03 NOTE — Telephone Encounter (Signed)
Thank you so much for your hard work

## 2018-04-07 IMAGING — CT CT HEAD W/O CM
5 of 7 series · 16 of 47 positions shown, 17 images · non-contrast
Comparison: MRI head April 21, 2015

CLINICAL DATA: Tripped and fell, struck head on toilet. No loss of
consciousness. History of hypertension.

EXAM:
CT HEAD WITHOUT CONTRAST
CT CERVICAL SPINE WITHOUT CONTRAST
TECHNIQUE: Multidetector CT imaging of the head and cervical spine was
performed following the standard protocol without intravenous
contrast. Multiplanar CT image reconstructions of the cervical spine
were also generated.

[Series 2: head wo · axial · 0.42mm/px · z∈[-59,+21]mm · 3 of 32 slices shown, 4 images]
[im 8/32  brain]
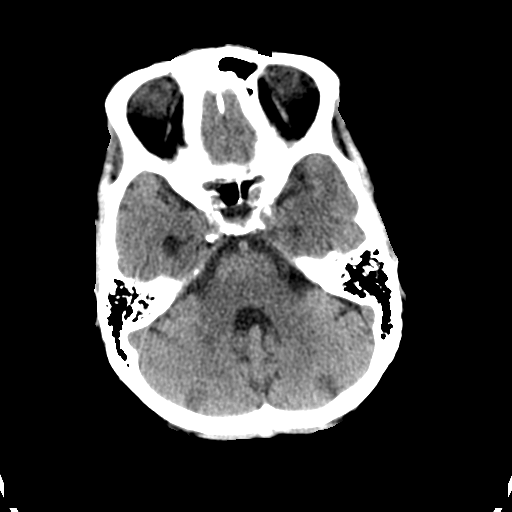
[im 8/32  bone]
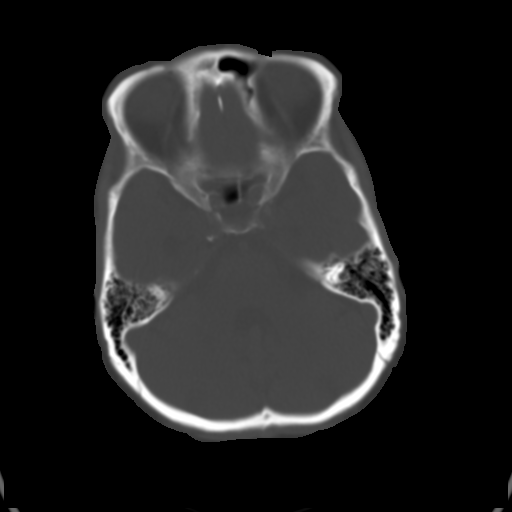
[im 16/32  brain]
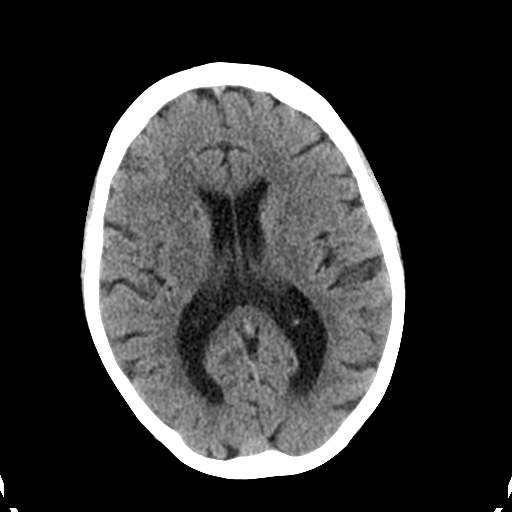
[im 24/32  brain]
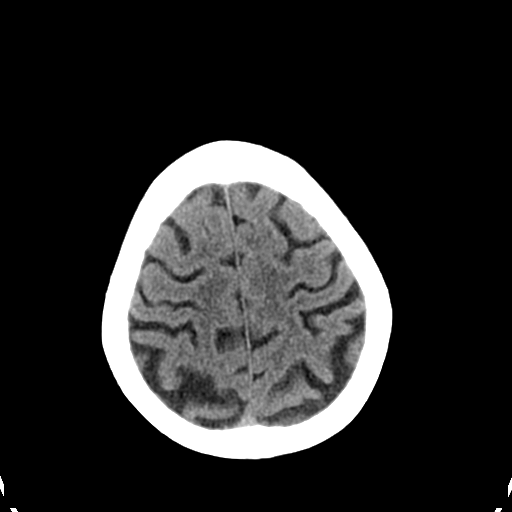

[Series 4: coronal soft tissue · coronal · 0.31mm/px · 3 of 61 slices shown]
[im 21/61  brain]
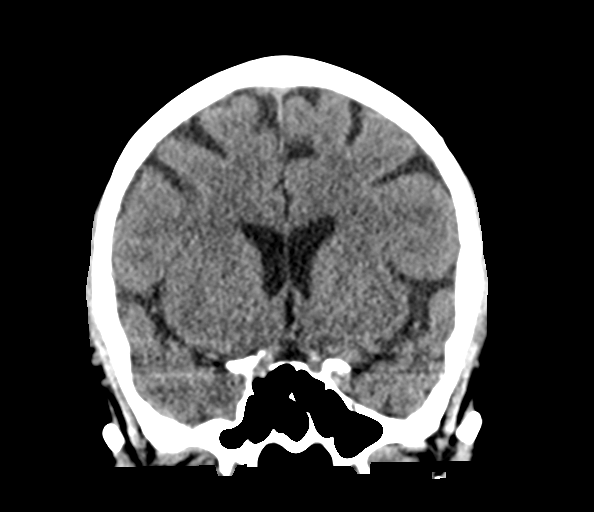
[im 31/61  brain]
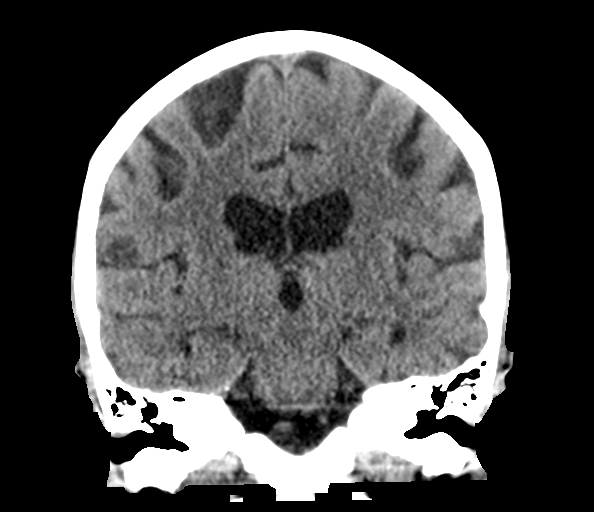
[im 41/61  brain]
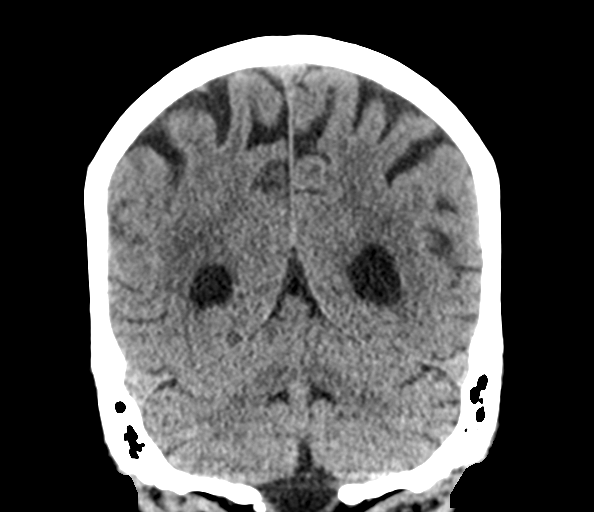

[Series 5: sagittal soft tissue · sagittal · 0.31mm/px · 1 of 45 slices shown]
[im 23/45  brain]
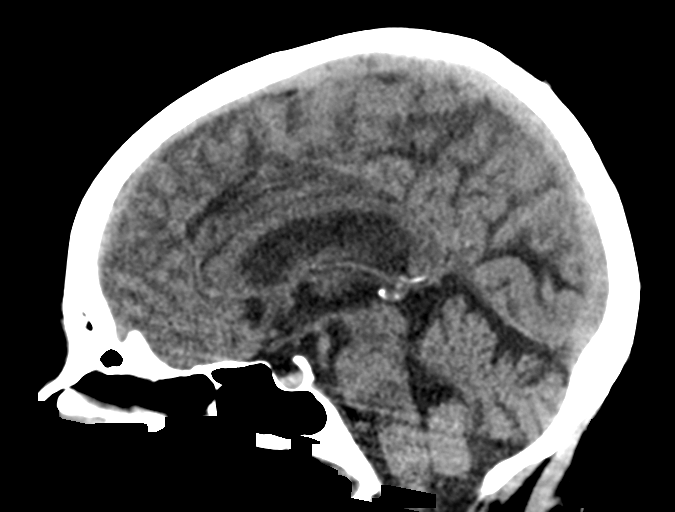

[Series 7: c spine soft · axial · 0.26mm/px · 1 of 83 slices shown]
[im 8/83  brain]
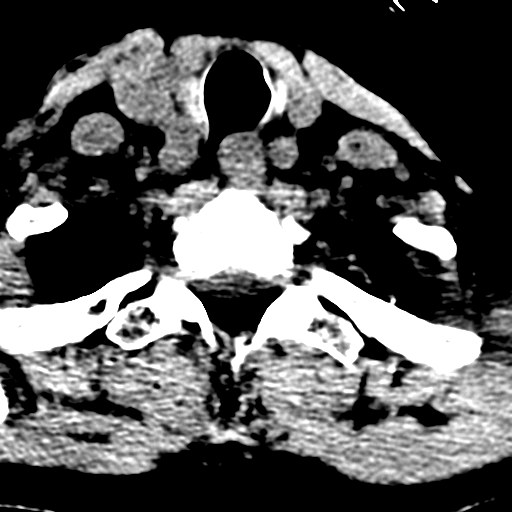

[Series 10: orthogonal bone · axial · 0.23mm/px · z∈[-242,-111]mm · 8 of 85 slices shown]
[im 8/85  bone]
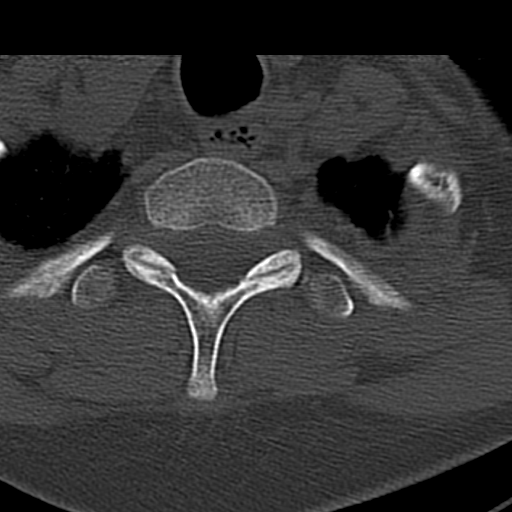
[im 22/85  bone]
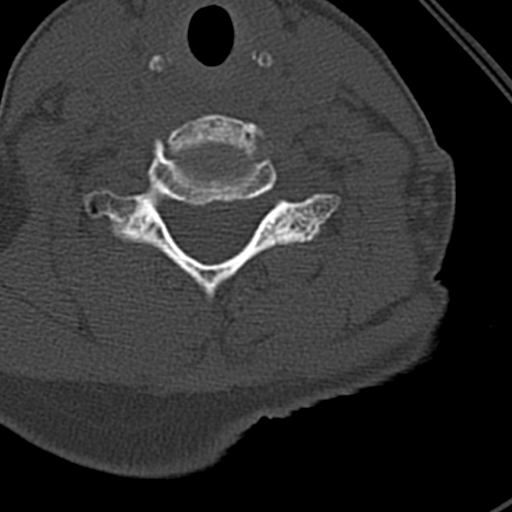
[im 29/85  bone]
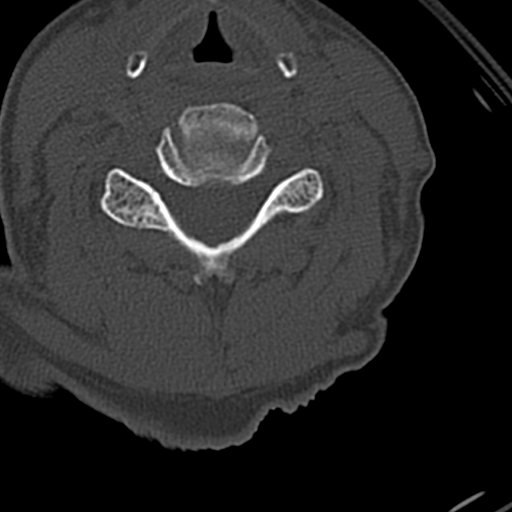
[im 36/85  bone]
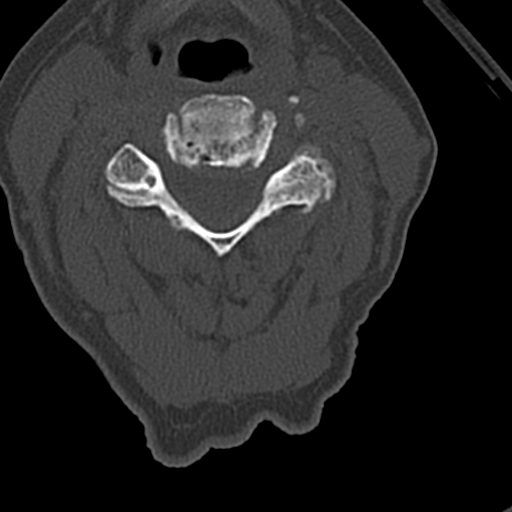
[im 50/85  bone]
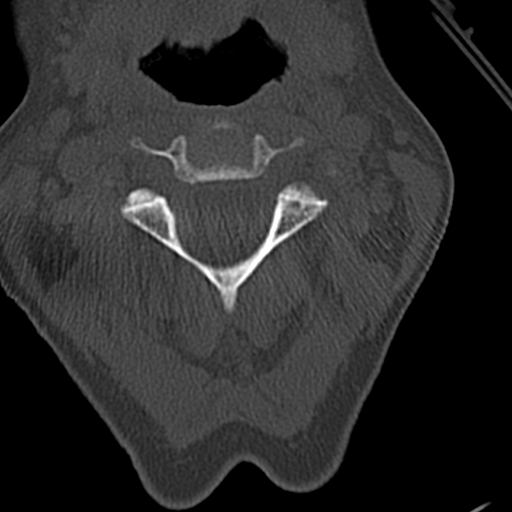
[im 57/85  bone]
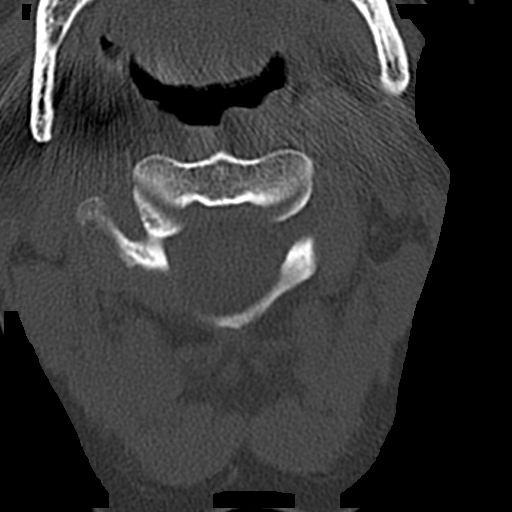
[im 64/85  bone]
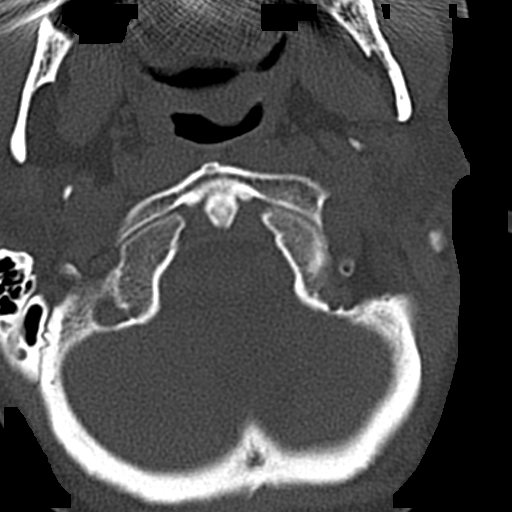
[im 78/85  bone]
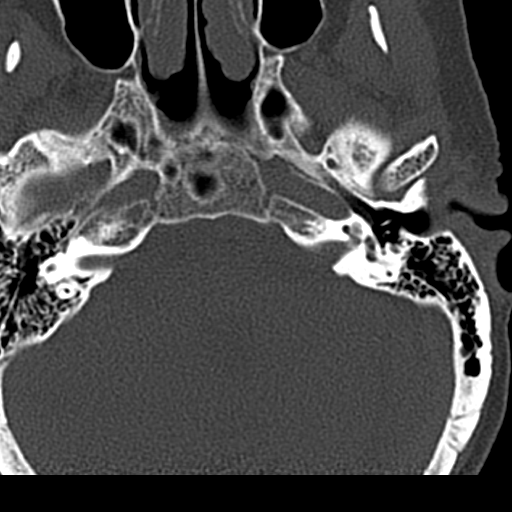

[16 of 47 positions shown; findings below may reference images not displayed]

FINDINGS: CT HEAD FINDINGS

BRAIN: Moderate ventriculomegaly on the basis of global parenchymal
brain volume loss, similar to prior MRI. New age indeterminate RIGHT
caudate lacunar infarct. No intraparenchymal hemorrhage, mass effect
nor midline shift. Patchy supratentorial white matter hypodensities
within normal range for patient's age, though non-specific are most
compatible with chronic small vessel ischemic disease. No acute
large vascular territory infarcts. No abnormal extra-axial fluid
collections. Basal cisterns are patent.

VASCULAR: Mild calcific atherosclerosis of the carotid siphons.

SKULL: No skull fracture. Small LEFT frontal scalp hematoma without
subcutaneous gas or radiopaque foreign bodies.

SINUSES/ORBITS: The mastoid air-cells and included paranasal sinuses
are well-aerated.The included ocular globes and orbital contents are
non-suspicious.

OTHER: None.

CT CERVICAL SPINE FINDINGS

ALIGNMENT: Maintained lordosis. Minimal grade 1 C5-6 and C6-7
anterolisthesis. Multilevel moderate LEFT and moderate to severe
RIGHT facet arthropathy.

SKULL BASE AND VERTEBRAE: Cervical vertebral bodies and posterior
elements are intact. Intervertebral disc heights preserved. No
destructive bony lesions. C1-2 articulation maintained.

SOFT TISSUES AND SPINAL CANAL: Normal.

DISC LEVELS: No significant osseous canal stenosis. Mild to moderate
LEFT C4-5, RIGHT C5-6 neural foraminal narrowing.

UPPER CHEST: Lung apices are clear.

OTHER: None.
IMPRESSION: CT HEAD: Small LEFT frontal scalp hematoma.  No skull fracture.

No acute intracranial process. New age indeterminate RIGHT caudate
lacunar infarct.

Otherwise similar examination including moderate global parenchymal
brain volume loss.

CT CERVICAL SPINE: No acute fracture. Minimal grade 1 C5-6 and C6-7
anterolisthesis on degenerative basis.

## 2018-04-08 ENCOUNTER — Telehealth: Payer: Self-pay | Admitting: Family Medicine

## 2018-04-08 NOTE — Telephone Encounter (Signed)
Patient has questions about medication interactions and different supplements.  She would like to discuss this with her provider.

## 2018-04-09 ENCOUNTER — Ambulatory Visit: Payer: Self-pay | Admitting: Family Medicine

## 2018-04-09 NOTE — Telephone Encounter (Signed)
Left detailed voicemail

## 2018-04-10 ENCOUNTER — Ambulatory Visit: Payer: Self-pay

## 2018-04-10 NOTE — Telephone Encounter (Signed)
Agree with recommendation to ER for severe shortness of breath. Dr. Sanda Klein is not available.

## 2018-04-10 NOTE — Telephone Encounter (Signed)
Incoming call from Patient with complaint of SOB, achy, Headchce,  Fatigue.   Denies temperature.  Reports coughing up yellow /greeninsh phlegm. Onset a month ago.  , lives alone.  Patient states she slept all day  yesterday. Patient reports SOB is getting worse.   Rates it moderate to severe .  Patient states her appointment was cancelled yesterday.  Patient reports hx of hiatal hernia.   COPD,  Has nebulize. Reports  Runny nose, cough and chest pain , denies travel outside of country.  Reviewed protocol with Patient which recommends Patient go to ED for further evaluation.  Patient refuses to go.  Request an return phone call from  Dr.  Sanda Klein.  Pleases.  Pharmacy is Paediatric nurse / [employer].  Patient may be reached at [phone]) Patient awaits return phone call.   Reason for Disposition . [1] MODERATE difficulty breathing (e.g., speaks in phrases, SOB even at rest, pulse 100-120) AND [2] NEW-onset or WORSE than normal  Answer Assessment - Initial Assessment Questions 1. RESPIRATORY STATUS: "Describe your breathing?" (e.g., wheezing, shortness of breath, unable to speak, severe coughing)      SOS 2. ONSET: "When did this breathing problem begin?"      A month ago 3. PATTERN "Does the difficult breathing come and go, or has it been constant since it started?"     Getting worse 4. SEVERITY: "How bad is your breathing?" (e.g., mild, moderate, severe)    - MILD: No SOB at rest, mild SOB with walking, speaks normally in sentences, can lay down, no retractions, pulse < 100.    - MODERATE: SOB at rest, SOB with minimal exertion and prefers to sit, cannot lie down flat, speaks in phrases, mild retractions, audible wheezing, pulse 100-120.    - SEVERE: Very SOB at rest, speaks in single words, struggling to breathe, sitting hunched forward, retractions, pulse > 120      Moderate severe 5. RECURRENT SYMPTOM: "Have you had difficulty breathing before?" If so, ask: "When was the last time?" and "What happened  that time?"       6. CARDIAC HISTORY: "Do you have any history of heart disease?" (e.g., heart attack, angina, bypass surgery, angioplasty)      Yes hiatal hernial 7. LUNG HISTORY: "Do you have any history of lung disease?"  (e.g., pulmonary embolus, asthma, emphysema)     COPD , nebulizer   8. CAUSE: "What do you think is causing the breathing problem?"      9. OTHER SYMPTOMS: "Do you have any other symptoms? (e.g., dizziness, runny nose, cough, chest pain, fever)     Runny nose cough  Chest pain 10. PREGNANCY: "Is there any chance you are pregnant?" "When was your last menstrual period?"        11. TRAVEL: "Have you traveled out of the country in the last month?" (e.g., travel history, exposures)       denies  Protocols used: BREATHING DIFFICULTY-A-AH

## 2018-04-11 ENCOUNTER — Ambulatory Visit (INDEPENDENT_AMBULATORY_CARE_PROVIDER_SITE_OTHER): Payer: Medicare Other | Admitting: Nurse Practitioner

## 2018-04-11 NOTE — Telephone Encounter (Signed)
Pt notified E visit scheduled

## 2018-04-12 ENCOUNTER — Telehealth: Payer: Self-pay | Admitting: Nurse Practitioner

## 2018-04-12 ENCOUNTER — Ambulatory Visit (INDEPENDENT_AMBULATORY_CARE_PROVIDER_SITE_OTHER): Payer: Medicare Other | Admitting: Nurse Practitioner

## 2018-04-12 ENCOUNTER — Encounter: Payer: Self-pay | Admitting: Nurse Practitioner

## 2018-04-12 DIAGNOSIS — E781 Pure hyperglyceridemia: Secondary | ICD-10-CM

## 2018-04-12 DIAGNOSIS — J40 Bronchitis, not specified as acute or chronic: Secondary | ICD-10-CM | POA: Diagnosis not present

## 2018-04-12 DIAGNOSIS — I10 Essential (primary) hypertension: Secondary | ICD-10-CM

## 2018-04-12 DIAGNOSIS — M25562 Pain in left knee: Secondary | ICD-10-CM

## 2018-04-12 DIAGNOSIS — K219 Gastro-esophageal reflux disease without esophagitis: Secondary | ICD-10-CM

## 2018-04-12 DIAGNOSIS — E039 Hypothyroidism, unspecified: Secondary | ICD-10-CM | POA: Diagnosis not present

## 2018-04-12 DIAGNOSIS — G8929 Other chronic pain: Secondary | ICD-10-CM

## 2018-04-12 DIAGNOSIS — J449 Chronic obstructive pulmonary disease, unspecified: Secondary | ICD-10-CM | POA: Diagnosis not present

## 2018-04-12 DIAGNOSIS — M25561 Pain in right knee: Secondary | ICD-10-CM

## 2018-04-12 MED ORDER — FAMOTIDINE 20 MG PO TABS: 20.0000 mg | ORAL_TABLET | Freq: Two times a day (BID) | ORAL | 1 refills | Status: DC | PRN

## 2018-04-12 MED ORDER — ATORVASTATIN CALCIUM 20 MG PO TABS: 20.0000 mg | ORAL_TABLET | Freq: Every day | ORAL | 1 refills | Status: DC

## 2018-04-12 MED ORDER — AMLODIPINE BESYLATE 5 MG PO TABS: 5.0000 mg | ORAL_TABLET | Freq: Every day | ORAL | 1 refills | Status: DC

## 2018-04-12 MED ORDER — ICOSAPENT ETHYL 1 G PO CAPS: ORAL_CAPSULE | ORAL | 0 refills | Status: DC

## 2018-04-12 MED ORDER — PRO-STAT 64 PO LIQD: 30.0000 mL | Freq: Every day | ORAL | 0 refills | Status: DC

## 2018-04-12 MED ORDER — LEVOTHYROXINE SODIUM 100 MCG PO TABS: 100.0000 ug | ORAL_TABLET | Freq: Every day | ORAL | 0 refills | Status: DC

## 2018-04-12 MED ORDER — DM-GUAIFENESIN ER 30-600 MG PO TB12: 1.0000 | ORAL_TABLET | Freq: Two times a day (BID) | ORAL | 0 refills | Status: DC

## 2018-04-12 MED ORDER — ACETAMINOPHEN ER 650 MG PO TBCR: 650.0000 mg | EXTENDED_RELEASE_TABLET | Freq: Three times a day (TID) | ORAL | 0 refills | Status: DC | PRN

## 2018-04-12 MED ORDER — DICLOFENAC SODIUM 1 % TD GEL: TRANSDERMAL | 1 refills | Status: DC

## 2018-04-12 NOTE — Telephone Encounter (Signed)
Can you check on prior authorization for ventolin, & lidocaine for paitent.

## 2018-04-12 NOTE — Progress Notes (Signed)
Virtual Visit via Telephone Note  I connected with Angela [name] on 04/12/18 at  3:20 PM EDT by telephone and verified that I am speaking with the correct person using two identifiers.   Staff discussed the limitations, risks, security and privacy concerns of performing an evaluation and management service by telephone and the availability of in person appointments. The patient expressed understanding and agreed to proceed.  History of Present Illness: States she is not feeling well fatigued, and flushed face, headache, cough, low grade for 2 weeks. Cough is getting worse.   Hypertension rx amlodipine 5mg  daily  BP Readings from Last 3 Encounters:  02/26/18 124/80  02/18/18 (!) 167/94  02/08/18 122/82    Hyperlipidemia Atorvastatin 20mg , vascepa   Lab Results  Component Value Date   CHOL 171 02/26/2018   HDL 60 02/26/2018   LDLCALC 92 02/26/2018   TRIG 98 02/26/2018   CHOLHDL 2.9 02/26/2018    COPD  xopenex neb PRN, awaiting approval for PA for ventolin per patient. See Dr. Vella Kohler- pulmonologist    Hypothyroidism rx Synthroid 166mcg daily Lab Results  Component Value Date   TSH 0.41 02/26/2018    Observations/Objective:   Assessment and Plan: 1. Chronic obstructive pulmonary disease, unspecified COPD type ([place]) Follow up with pulmonology   2. Essential hypertension - amLODipine (NORVASC) 5 MG tablet; Take 1 tablet (5 mg total) by mouth daily.  Dispense: 90 tablet; Refill: 1  3. Hypothyroidism, unspecified type - levothyroxine (SYNTHROID, LEVOTHROID) 100 MCG tablet; Take 1 tablet (100 mcg total) by mouth daily at 6 (six) AM.  Dispense: 90 tablet; Refill: 0  4. High triglycerides - atorvastatin (LIPITOR) 20 MG tablet; Take 1 tablet (20 mg total) by mouth at bedtime.  Dispense: 90 tablet; Refill: 1 - Icosapent Ethyl (VASCEPA) 1 g CAPS; Two capsules by mouth twice a day (to help with triglycerides)  Dispense: 120 capsule; Refill: 0  5.  Bronchitis Discussed ER precautions  - acetaminophen (TYLENOL) 650 MG CR tablet; Take 1 tablet (650 mg total) by mouth every 8 (eight) hours as needed for pain.  Dispense: 90 tablet; Refill: 0 - dextromethorphan-guaiFENesin (MUCINEX DM) 30-600 MG 12hr tablet; Take 1 tablet by mouth 2 (two) times daily.  Dispense: 30 tablet; Refill: 0  6. Gastroesophageal reflux disease without esophagitis - famotidine (PEPCID) 20 MG tablet; Take 1 tablet (20 mg total) by mouth 2 (two) times daily as needed for heartburn or indigestion.  Dispense: 180 tablet; Refill: 1  7. Chronic pain of both knees - diclofenac sodium (VOLTAREN) 1 % GEL; Apply small amount (2 grams) topically over knees or hands up to four times a day if needed for arthritis  Dispense: 100 g; Refill: 1 - acetaminophen (TYLENOL) 650 MG CR tablet; Take 1 tablet (650 mg total) by mouth every 8 (eight) hours as needed for pain.  Dispense: 90 tablet; Refill: 0   Follow Up Instructions:    I discussed the assessment and treatment plan with the patient. The patient was provided an opportunity to ask questions and all were answered. The patient agreed with the plan and demonstrated an understanding of the instructions.   The patient was advised to call back or seek an in-person evaluation if the symptoms worsen or if the condition fails to improve as anticipated.  I provided 24 minutes of non-face-to-face time during this encounter.   Fredderick Severance, NP

## 2018-04-15 ENCOUNTER — Telehealth: Payer: Self-pay

## 2018-04-15 MED ORDER — ALBUTEROL SULFATE HFA 108 (90 BASE) MCG/ACT IN AERS: 2.0000 | INHALATION_SPRAY | RESPIRATORY_TRACT | 1 refills | Status: DC | PRN

## 2018-04-15 NOTE — Telephone Encounter (Signed)
Called to clarify with Lattie Haw she states that home health/nurse would not come out until she was seen due to possible COVID-19?

## 2018-04-15 NOTE — Telephone Encounter (Signed)
Call Copied from [place] [phone]. Topic: General - Other >> Apr 15, 2018 12:40 PM Antonieta Iba C wrote: Reason for CRM: [facility] Nurse with [facility] is calling in to speak with PCP or CMA in regards to pt. She said that they received orders for services and went out to home to assess. They advised pt to be seen at ED due to pt having flu - like symptoms such as cough and fever (they are aware that pt has COPD) Lattie Haw would like to make provider aware that pt refused going to ED. Lattie Haw says that they advised that she would need to be seen before they continue with services with pt. Lattie Haw would like a call back as soon as possible to discuss further.    Lattie Haw call back: [phone]  At this time the office is away at lunch, advised that some one will call her back.

## 2018-04-15 NOTE — Telephone Encounter (Signed)
I left a detailed message minus the patient's name and date of birth, just getting back to them about patient who had flu-like symptoms who declined ER evaluation I'm not sure what services are on hold until patient is seen (personal care services? Therapy? Etc?) Also, I'm not sure who needs to see the patient before services are continued (home health nurse? ER? Primary care?) I would imagine that a home health nurse visit would be ideal for a sick patient to be evaluated, so that's why I was unsure who needs to see her Just would like some clarification

## 2018-04-15 NOTE — Telephone Encounter (Signed)
Ventolin was approved in Feb and the lidocaine must be paid out of pocket.

## 2018-04-15 NOTE — Telephone Encounter (Signed)
We cannot bring her in to the office if they think she may have COVID-19 If she is short of breath or has fever or cough or difficulty breathing or change in mental status or anything that makes them think she is sick with COVID-19 and she needs to be seen, she needs to be evaluated in an emergency setting right away (the situation is rapidly changing, but that is my current understanding of our guidelines right now) We cannot see her here just to clear her from home health care  It was after 5 pm as I type this note, so I called patient personally Start 5:04, reached voicemail, stopped 5:06 pm Left detailed message urged patient to get evaluation ASAP please if [facility], cough, fever, difficulty breathing, etc Explained she can talk to someone on-call if needed, number to clinic given Will ask my staff to check in with her tomorrow (Tuesday)  --------------------------------------------------------  Roselyn Reef, Please call patient Tuesday (March 31st) morning, see how she's doing Please contact home health, let them know what we've done (advised patient to seek care to be evaluated)

## 2018-04-17 NOTE — Telephone Encounter (Signed)
Pt notified, states is feeling better but refused to go to [facility] to be tested, states its an awful hospital.  I stated she could go to any hospital to be tested most likely.  [facility], [facility], [facility], [facility].  She then asked about rx's being sent to mail order, which does not look like has be done, so I asked her optum rx? And she doesn't know?  I told her it was her responsibility to call her ins to find out what mail order for her ins is required?    She would like a call back from you

## 2018-04-17 NOTE — Telephone Encounter (Signed)
I left the office today at 3:21 pm I am reading this note well after hours She sounds to be doing better, so I will call during business hours on Thursday

## 2018-04-18 NOTE — Telephone Encounter (Signed)
I tried to reach patient, left message I'm glad she is feeling better I would like to ask her to tell us exactly what prescriptions she needs, as she has multiple drugs on her list; the first two that I pulled up were just refilled in March for a 6 month supply, so if she would just please call us and let know exactly what medicines she needs and where she wants them to go, I'd appreciate it

## 2018-04-19 ENCOUNTER — Emergency Department
Admission: EM | Admit: 2018-04-19 | Discharge: 2018-04-19 | Disposition: A | Discharge status: Home / Self Care | Payer: Medicare Other | Attending: Emergency Medicine | Admitting: Emergency Medicine

## 2018-04-19 ENCOUNTER — Emergency Department: Payer: Medicare Other

## 2018-04-19 ENCOUNTER — Other Ambulatory Visit: Payer: Self-pay

## 2018-04-19 ENCOUNTER — Telehealth: Payer: Self-pay | Admitting: Family Medicine

## 2018-04-19 DIAGNOSIS — Z79899 Other long term (current) drug therapy: Secondary | ICD-10-CM | POA: Diagnosis not present

## 2018-04-19 DIAGNOSIS — M25562 Pain in left knee: Secondary | ICD-10-CM

## 2018-04-19 DIAGNOSIS — E039 Hypothyroidism, unspecified: Secondary | ICD-10-CM

## 2018-04-19 DIAGNOSIS — Z87891 Personal history of nicotine dependence: Secondary | ICD-10-CM | POA: Insufficient documentation

## 2018-04-19 DIAGNOSIS — G8929 Other chronic pain: Secondary | ICD-10-CM

## 2018-04-19 DIAGNOSIS — Z91010 Allergy to peanuts: Secondary | ICD-10-CM | POA: Diagnosis not present

## 2018-04-19 DIAGNOSIS — R42 Dizziness and giddiness: Secondary | ICD-10-CM | POA: Diagnosis present

## 2018-04-19 DIAGNOSIS — R0602 Shortness of breath: Secondary | ICD-10-CM | POA: Diagnosis not present

## 2018-04-19 DIAGNOSIS — M25561 Pain in right knee: Secondary | ICD-10-CM

## 2018-04-19 DIAGNOSIS — I10 Essential (primary) hypertension: Secondary | ICD-10-CM

## 2018-04-19 DIAGNOSIS — K219 Gastro-esophageal reflux disease without esophagitis: Secondary | ICD-10-CM

## 2018-04-19 DIAGNOSIS — J441 Chronic obstructive pulmonary disease with (acute) exacerbation: Secondary | ICD-10-CM | POA: Diagnosis not present

## 2018-04-19 DIAGNOSIS — R05 Cough: Secondary | ICD-10-CM | POA: Diagnosis not present

## 2018-04-19 DIAGNOSIS — R197 Diarrhea, unspecified: Secondary | ICD-10-CM | POA: Diagnosis not present

## 2018-04-19 DIAGNOSIS — E781 Pure hyperglyceridemia: Secondary | ICD-10-CM

## 2018-04-19 DIAGNOSIS — R079 Chest pain, unspecified: Secondary | ICD-10-CM | POA: Diagnosis not present

## 2018-04-19 DIAGNOSIS — J40 Bronchitis, not specified as acute or chronic: Secondary | ICD-10-CM

## 2018-04-19 LAB — BASIC METABOLIC PANEL
Anion gap: 10 (ref 5–15)
BUN: 15 mg/dL (ref 8–23)
CO2: 26 mmol/L (ref 22–32)
Calcium: 9.2 mg/dL (ref 8.9–10.3)
Chloride: 102 mmol/L (ref 98–111)
Creatinine, Ser: 0.6 mg/dL (ref 0.44–1.00)
GFR calc Af Amer: >60 mL/min (ref >60)
GFR calc non Af Amer: >60 mL/min (ref >60)
Glucose, Bld: 95 mg/dL (ref 70–99)
Potassium: 3.9 mmol/L (ref 3.5–5.1)
Sodium: 138 mmol/L (ref 135–145)

## 2018-04-19 LAB — CBC WITH DIFFERENTIAL/PLATELET
Abs Immature Granulocytes: 0.01 10*3/uL (ref 0.00–0.07)
Basophils Absolute: 0 10*3/uL (ref 0.0–0.1)
Basophils Relative: 1 %
Eosinophils Absolute: 0.2 10*3/uL (ref 0.0–0.5)
Eosinophils Relative: 4 %
HCT: 36.7 % (ref 36.0–46.0)
Hemoglobin: 12.1 g/dL (ref 12.0–15.0)
Immature Granulocytes: 0 %
Lymphocytes Relative: 35 %
Lymphs Abs: 1.6 10*3/uL (ref 0.7–4.0)
MCH: 30.8 pg (ref 26.0–34.0)
MCHC: 33 g/dL (ref 30.0–36.0)
MCV: 93.4 fL (ref 80.0–100.0)
Monocytes Absolute: 0.6 10*3/uL (ref 0.1–1.0)
Monocytes Relative: 13 %
Neutro Abs: 2.2 10*3/uL (ref 1.7–7.7)
Neutrophils Relative %: 47 %
Platelets: 277 10*3/uL (ref 150–400)
RBC: 3.93 MIL/uL (ref 3.87–5.11)
RDW: 13 % (ref 11.5–15.5)
WBC: 4.6 10*3/uL (ref 4.0–10.5)
nRBC: 0 % (ref 0.0–0.2)

## 2018-04-19 LAB — TROPONIN I: Troponin I: <0.03 ng/mL (ref <0.03)

## 2018-04-19 MED ORDER — ICOSAPENT ETHYL 1 G PO CAPS: ORAL_CAPSULE | ORAL | 0 refills | Status: DC

## 2018-04-19 MED ORDER — DEXAMETHASONE SODIUM PHOSPHATE 10 MG/ML IJ SOLN
10.0000 mg | Freq: Once | INTRAMUSCULAR | Status: AC
  Administered 2018-04-19: 10 mg via INTRAMUSCULAR
  Filled 2018-04-19: qty 1

## 2018-04-19 MED ORDER — AMLODIPINE BESYLATE 5 MG PO TABS: 5.0000 mg | ORAL_TABLET | Freq: Every day | ORAL | 0 refills | Status: DC

## 2018-04-19 MED ORDER — FLUCONAZOLE 150 MG PO TABS: 150.0000 mg | ORAL_TABLET | Freq: Once | ORAL | 0 refills | Status: AC

## 2018-04-19 MED ORDER — DICLOFENAC SODIUM 1 % TD GEL: TRANSDERMAL | 0 refills | Status: DC

## 2018-04-19 MED ORDER — LEVOTHYROXINE SODIUM 100 MCG PO TABS: 100.0000 ug | ORAL_TABLET | Freq: Every day | ORAL | 0 refills | Status: DC

## 2018-04-19 MED ORDER — AZITHROMYCIN 250 MG PO TABS: ORAL_TABLET | ORAL | 0 refills | Status: AC

## 2018-04-19 MED ORDER — SODIUM CHLORIDE 0.9 % IV BOLUS
500.0000 mL | Freq: Once | INTRAVENOUS | Status: AC
  Administered 2018-04-19: 500 mL via INTRAVENOUS

## 2018-04-19 MED ORDER — ATORVASTATIN CALCIUM 20 MG PO TABS: 20.0000 mg | ORAL_TABLET | Freq: Every day | ORAL | 0 refills | Status: DC

## 2018-04-19 MED ORDER — FAMOTIDINE 20 MG PO TABS: 20.0000 mg | ORAL_TABLET | Freq: Two times a day (BID) | ORAL | 0 refills | Status: DC | PRN

## 2018-04-19 MED ORDER — LEVALBUTEROL HCL 0.63 MG/3ML IN NEBU: 0.6300 mg | INHALATION_SOLUTION | RESPIRATORY_TRACT | 0 refills | Status: DC | PRN

## 2018-04-19 NOTE — Telephone Encounter (Signed)
I spoke with patient She is feeling worse, feeling achy and dry cough, very very tired She was sleeping, just exhausted, living alone Nurse from [employer] "finally" came on Sunday and said she should get tested Cannot get home health aide without testing COVID-19 She said it has been a long process getting a home health aide She needs to go to urgent care or the ER NOW where they can evaluate her and test her "they don't know anything at your office" Roselyn Reef told her to go to the ER and she says she doesn't want to go to the ER Do go to get tested at the [facility] at urgent care or ER She is having trouble breathing "for about a month" Call 911 if trouble breathing, "alright" she says; I repeated this twice No fever She asked if I could prescribe the medicines she has heard about that they are using to treat COVID-19; absolutely not, I am not going to just treat with hydrochloroquine or azithromycin based on the news without a positive test over the phone; not appropriate; she needs to be evaluated and tested  Do contact pulmonologist for all pulmonology medicines She has never had a general practitioner who segregated medicines I told her that she has a lung specialist and should contact her lung doctor for those refills I do not prescribe eye drops She will begin looking for another doctor she says  Magnesium, she asked about L-threonate; I told her I don't know about that  Length of call: 10 minutes 55 seconds  -----------------------------------------------------------  Roselyn Reef, can you confirm WHERE she needs to go to get evaluate for COVID-19 I was told there may be acute respiratory clinics being set up If yes, then send her there If no, then make sure they are doing COVID-19 at either [facility] and/or [facility] and give her those numbers; she needs to call them and let them know she is coming

## 2018-04-19 NOTE — Discharge Instructions (Addendum)
Use albuterol every 4-6 hours for the next several days.  Take the azithromycin and finish the full course.  Follow-up with Dr. Sanda Klein.  Return to the ER for new or worsening shortness of breath, chest pain, weakness, or any other new or worsening symptoms that concern you.

## 2018-04-19 NOTE — Telephone Encounter (Signed)
Pt notified home health would call her back and get her transportation today

## 2018-04-19 NOTE — Telephone Encounter (Signed)
Pt notified, and Called health dept they will call her for ride to get her checked out.  Pt told to make sure she answers her phone

## 2018-04-19 NOTE — ED Provider Notes (Signed)
[facility] Emergency Department Provider Note ____________________________________________   First MD Initiated Contact with Patient 04/19/18 1513     (approximate)  I have reviewed the triage vital signs and the nursing notes.   HISTORY  Chief Complaint Dizziness; Chest Pain; and Shortness of Breath    HPI Angela [name] is a 68 y.o. female with PMH as noted below who presents with multiple symptoms over the last week, primarily worsening shortness of breath and nonproductive cough.  In addition she has decreased exercise tolerance, and she reports generalized fatigue and weakness.  Patient states that she has only been using albuterol occasionally.  She states she feels like she has an infection.  She denies any travel outside of the state or exposure to people at high risk for COVID-19.  Past Medical History:  Diagnosis Date   Allergic rhinitis    Anemia    Blurred vision    Depression    Diverticulosis    Endometriosis    Falls    GERD (gastroesophageal reflux disease)    Hernia, hiatal    Hypothyroidism    IBS (irritable bowel syndrome)    Malignant neoplasm of skin    Migraine    Neuropathy    PTSD (post-traumatic stress disorder)    Thyroid disease     Patient Active Problem List   Diagnosis Date Noted   Diastolic dysfunction 64/40/3474   Mitral regurgitation 03/29/2018   COPD (chronic obstructive pulmonary disease) ([place]) 03/15/2018   Bronchospasm 03/15/2018   Axis II diagnosis deferred 02/20/2018   Preventative health care 02/08/2018   Polypharmacy 02/01/2018   Personality disorder ([place]) 12/25/2017   Noncompliance 12/25/2017   Medication monitoring encounter 12/07/2017   Family history of high cholesterol 12/07/2017   Arthritis of hand, degenerative 12/07/2017   Bipolar I disorder, most recent episode mixed, severe with psychotic features ([place]) 09/18/2017   Agitation 09/18/2017   Depressed  mood 07/11/2017   Hallux valgus, acquired 07/05/2017   Hammer toes of both feet 07/05/2017   Unstable ankle 07/05/2017   Chronic bilateral low back pain without sciatica 06/09/2017   Tinea unguium 05/08/2017   Unresolved grief 05/08/2017   Conductive hearing loss of right ear with unrestricted hearing of left ear 04/03/2017   Adjustment disorder with depressed mood 01/18/2017   Migraine 01/18/2017   Postmenopausal bleeding 01/18/2017   IBS (irritable bowel syndrome) 11/17/2016   Bilateral carpal tunnel syndrome 11/17/2016   Plantar fasciitis, bilateral 11/17/2016   Cervical myofascial pain syndrome 11/17/2016   Chronic hyponatremia 11/17/2016   PTSD (post-traumatic stress disorder) 11/17/2016   Hernia, hiatal 11/17/2016   Vitamin D deficiency 11/17/2016   Vitamin B12 deficiency 11/17/2016   SIADH (syndrome of inappropriate ADH production) ([place]) 09/08/2016   Cervical spondylosis with myelopathy 05/24/2016   Post-concussion headache 05/24/2016   MCI (mild cognitive impairment) with memory loss 03/30/2016   Gait abnormality 03/30/2016   Chronic bipolar affective disorder ([place]) 01/24/2016   Recurrent major depressive disorder, in partial remission ([place]) 01/24/2016   Anemia, iron deficiency 06/24/2015   Endometriosis 06/24/2015   Essential hypertension 06/24/2015   History of falling 05/22/2015   History of TIA (transient ischemic attack) 04/20/2015   Hypothyroidism 04/20/2015   GERD (gastroesophageal reflux disease) 04/20/2015   Cervical disc disorder at C5-C6 level with radiculopathy 03/25/2015   Chronic neck pain 03/25/2015   Pelvic pain in female 10/30/2014   History of skin cancer 06/30/2014   Chronic sinusitis 06/30/2014   Chronic headaches 12/16/2013  Neuropathy 12/16/2013    Past Surgical History:  Procedure Laterality Date   APPENDECTOMY     CHOLECYSTECTOMY     CHOLECYSTECTOMY, LAPAROSCOPIC     ESOPHAGOGASTRODUODENOSCOPY  (EGD) WITH PROPOFOL N/A 03/29/2015   Procedure: ESOPHAGOGASTRODUODENOSCOPY (EGD) WITH PROPOFOL;  Surgeon: Josefine Class, MD;  Location[facility] END[facility]COPY;  Service: Endoscopy;  Laterality: N/A;   HERNIA REPAIR     laproscopy     TONSILLECTOMY     uteral suspension      Prior to Admission medications   Medication Sig Start Date End Date Taking? Authorizing Provider  acetaminophen (TYLENOL) 650 MG CR tablet Take 1 tablet (650 mg total) by mouth every 8 (eight) hours as needed for pain. 04/12/18   Poulose, Bethel Born, NP  albuterol (PROVENTIL HFA;VENTOLIN HFA) 108 (90 Base) MCG/ACT inhaler Inhale 2 puffs into the lungs every 4 (four) hours as needed for wheezing or shortness of breath. 04/15/18   Poulose, Bethel Born, NP  Amino Acids-Protein Hydrolys (FEEDING SUPPLEMENT, PRO-STAT 64,) LIQD Take 30 mLs by mouth daily. 04/12/18   Poulose, Bethel Born, NP  amLODipine (NORVASC) 5 MG tablet Take 1 tablet (5 mg total) by mouth daily. 04/19/18   Arnetha Courser, MD  atorvastatin (LIPITOR) 20 MG tablet Take 1 tablet (20 mg total) by mouth at bedtime. 04/19/18   Lada, Satira Anis, MD  azithromycin (ZITHROMAX Z-PAK) 250 MG tablet Take 2 tablets (500 mg) on  Day 1,  followed by 1 tablet (250 mg) once daily on Days 2 through 5. 04/19/18 04/24/18  Arta Silence, MD  Biotin 10000 MCG TABS Take by mouth.    [provider]  calcium carbonate (TUMS - DOSED IN MG ELEMENTAL CALCIUM) 500 MG chewable tablet Chew 1 tablet by mouth daily.    [provider]  Cholecalciferol 25 MCG (1000 UT) tablet Take 1 tablet (1,000 Units total) by mouth daily. 12/26/17   Clapacs, Madie Reno, MD  Cyanocobalamin (VITAMIN B-12) 1000 MCG SUBL Place under the tongue.    [provider]  cycloSPORINE (RESTASIS) 0.05 % ophthalmic emulsion Place 1 drop into both eyes 2 (two) times daily. 12/26/17   Clapacs, Madie Reno, MD  dextromethorphan-guaiFENesin (MUCINEX DM) 30-600 MG 12hr tablet Take 1 tablet by mouth 2 (two) times  daily. 04/12/18   Poulose, Bethel Born, NP  diclofenac sodium (VOLTAREN) 1 % GEL Apply small amount (2 grams) topically over knees or hands up to four times a day if needed for arthritis 04/19/18   Lada, Satira Anis, MD  dicyclomine (BENTYL) 10 MG capsule Take 10 mg by mouth as needed for spasms.    [provider]  famotidine (PEPCID) 20 MG tablet Take 1 tablet (20 mg total) by mouth 2 (two) times daily as needed for heartburn or indigestion. 04/19/18   Arnetha Courser, MD  ferrous sulfate 325 (65 FE) MG tablet Take 325 mg by mouth daily with breakfast.    [provider]  fluconazole (DIFLUCAN) 150 MG tablet Take 1 tablet (150 mg total) by mouth once for 1 dose. 04/19/18 04/19/18  Arta Silence, MD  gabapentin (NEURONTIN) 100 MG capsule Take 100 mg by mouth 2 (two) times daily.    [provider]  Glucosamine-Chondroitin (GLUCOSAMINE CHONDR COMPLEX PO) Take by mouth.    [provider]  guaiFENesin (MUCINEX) 600 MG 12 hr tablet Take by mouth 2 (two) times daily.    [provider]  hydrOXYzine (ATARAX/VISTARIL) 10 MG tablet TAKE 1 TO 2 TABLETS BY MOUTH ONCE  DAILY AS NEEDED 02/05/18   [provider]  Icosapent Ethyl (VASCEPA) 1 g CAPS Two capsules by mouth twice a day (to help with triglycerides) 04/19/18   Lada, Satira Anis, MD  ipratropium (ATROVENT) 0.03 % nasal spray Place 2 sprays into both nostrils every 12 (twelve) hours.    [provider]  levalbuterol Penne Lash) 0.63 MG/3ML nebulizer solution Take 3 mLs (0.63 mg total) by nebulization every 4 (four) hours as needed for wheezing or shortness of breath. 04/19/18 04/19/19  Arta Silence, MD  levalbuterol Penne Lash) 1.25 MG/3ML nebulizer solution  02/05/18   [provider]  levothyroxine (SYNTHROID, LEVOTHROID) 100 MCG tablet Take 1 tablet (100 mcg total) by mouth daily at 6 (six) AM. 04/19/18   Lada, Satira Anis, MD  linaclotide (LINZESS) 290 MCG CAPS capsule Take 1 capsule (290 mcg  total) by mouth daily before breakfast. 12/26/17   Clapacs, Madie Reno, MD  LORazepam (ATIVAN) 0.5 MG tablet TAKE 1 TABLET BY MOUTH AS NEEDED ONCE DAILY 02/06/18   [provider]  magnesium oxide (MAG-OX) 400 (241.3 Mg) MG tablet Take 0.5 tablets (200 mg total) by mouth 2 (two) times daily. Patient taking differently: Take 500 mg by mouth daily.  12/26/17   Clapacs, Madie Reno, MD  Multiple Vitamin (MULTIVITAMIN WITH MINERALS) TABS tablet Take 1 tablet by mouth daily. 12/26/17   Clapacs, Madie Reno, MD  PARoxetine (PAXIL-CR) 25 MG 24 hr tablet Take 1 tablet (25 mg total) by mouth daily. 12/27/17   Clapacs, Madie Reno, MD  polyvinyl alcohol (LIQUIFILM TEARS) 1.4 % ophthalmic solution Place 1 drop into both eyes as needed for dry eyes. 12/26/17   Clapacs, Madie Reno, MD  PREMARIN vaginal cream  01/31/18   [provider]  promethazine (PHENERGAN) 12.5 MG tablet Take 12.5 mg by mouth as needed.  02/05/18 08/04/18  [provider]  pyridoxine (B-6) 100 MG tablet Take 1 tablet (100 mg total) by mouth 2 (two) times daily. 12/26/17   Clapacs, Madie Reno, MD  simethicone (MYLICON) 80 MG chewable tablet Chew by mouth. 02/05/18 02/05/19  [provider]  tiZANidine (ZANAFLEX) 2 MG tablet Take by mouth every 6 (six) hours as needed for muscle spasms.    [provider]  traZODone (DESYREL) 50 MG tablet 50 mg as needed. Pt takes 1/2 tablet qhs 01/31/18   [provider]    Allergies Advair diskus [fluticasone-salmeterol]; Bee venom; Darvon [propoxyphene]; Dicyclomine; Other; Oxycodone-acetaminophen; Peanuts [peanut oil]; Penicillins; Sulfa antibiotics; Sulfacetamide sodium; Ultram [tramadol]; Amikacin; Aspirin; Doxycycline; Haloperidol; Hymenoptera venom preparations; Imipramine; Keflex [cephalexin]; Lactose intolerance (gi); Risperdal [risperidone]; Sulfur; Adhesive [tape]; Albuterol; Ciprofloxacin; Hydroxyzine; Peanut butter flavor; and Prednisone  Family History  Problem Relation  Age of Onset   Heart disease Father    Hearing loss Father    COPD Father    Depression Father    Stroke Father    Vision loss Father    Varicose Veins Father    Cancer Mother        lung   Depression Sister    Arthritis Sister    Arthritis Brother    Hernia Brother    Anxiety disorder Brother    Arthritis Brother    Hernia Brother    Anxiety disorder Brother    Urolithiasis Neg Hx    Kidney disease Neg Hx    Kidney cancer Neg Hx    Prostate cancer Neg Hx     Social History Social History   Tobacco Use   Smoking status:  Former Smoker    Last attempt to quit: 11/17/1976    Years since quitting: 41.4   Smokeless tobacco: Never Used  Substance Use Topics   Alcohol use: No   Drug use: No    Review of Systems  Constitutional: No fever.  Positive for chills and fatigue. Eyes: No redness. ENT: Positive for sinus pressure and congestion. Cardiovascular: Positive for chest pain. Respiratory: Positive for shortness of breath. Gastrointestinal: No vomiting.  Positive for diarrhea.  Genitourinary: Negative for dysuria.  Musculoskeletal: Negative for back pain. Skin: Negative for rash. Neurological: Negative for headache.   ____________________________________________   PHYSICAL EXAM:  VITAL SIGNS: ED Triage Vitals  Enc Vitals Group     BP 04/19/18 1436 (!) 152/79     Pulse Rate 04/19/18 1436 (!) 56     Resp 04/19/18 1436 16     Temp 04/19/18 1436 98.1 F (36.7 C)     Temp Source 04/19/18 1436 Oral     SpO2 04/19/18 1436 96 %     Weight 04/19/18 1437 125 lb (56.7 kg)     Height 04/19/18 1437 5\' 3"  (1.6 m)     Head Circumference --      Peak Flow --      Pain Score 04/19/18 1446 6     Pain Loc --      Pain Edu? --      Excl. in [place]? --     Constitutional: Alert and oriented. Well appearing and in no acute distress. Eyes: Conjunctivae are normal.  Head: Atraumatic. Nose: No congestion/rhinnorhea. Mouth/Throat: Mucous membranes are  slightly dry.   Neck: Normal range of motion.  Cardiovascular: Normal rate, regular rhythm. Grossly normal heart sounds.  Good peripheral circulation. Respiratory: Normal respiratory effort.  No retractions. Lungs CTAB. Gastrointestinal:  No distention.  Musculoskeletal: Extremities warm and well perfused.  Neurologic:  Normal speech and language. No gross focal neurologic deficits are appreciated.  Skin:  Skin is warm and dry. No rash noted. Psychiatric: Mood and affect are normal. Speech and behavior are normal.  ____________________________________________   LABS (all labs ordered are listed, but only abnormal results are displayed)  Labs Reviewed  BASIC METABOLIC PANEL  CBC WITH DIFFERENTIAL/PLATELET  TROPONIN I   ____________________________________________  EKG  ED ECG REPORT I, Arta Silence, the attending physician, personally viewed and interpreted this ECG.  Date: 04/19/2018 EKG Time: 1508 Rate: 54 Rhythm: normal sinus rhythm QRS Axis: normal Intervals: normal ST/T Wave abnormalities: normal Narrative Interpretation: no evidence of acute ischemia  ____________________________________________  RADIOLOGY  CXR: No focal infiltrate or other acute abnormality  ____________________________________________   PROCEDURES  Procedure(s) performed: No  Procedures  Critical Care performed: No ____________________________________________   INITIAL IMPRESSION / ASSESSMENT AND PLAN / ED COURSE  Pertinent labs & imaging results that were available during my care of the patient were reviewed by me and considered in my medical decision making (see chart for details).  68 year old female with a history of COPD and other PMH as noted above presents with shortness of breath, cough, fatigue and malaise over the last week.  She also reports some chest discomfort.  In addition, the patient has had diarrhea over approximately the last month.  She is worried that she  has an infection.  I reviewed the past medical records in [place].  The patient last presented in the ED 2 months ago with shortness of breath and reassuring work-up.  She also had an ED evaluation in December for a mental health  complaint.  On exam, the patient is overall relatively well-appearing.  Her vital signs are normal.  The lungs are clear.  She has slightly dry mucous membranes but an otherwise unremarkable exam.  EKG is nonischemic.  Chest x-ray obtained prior to my evaluation is negative.  Overall I suspect most likely viral URI/bronchitis exacerbating the patient's COPD.  Differential includes dehydration or other metabolic etiology, medication side effects, or less likely cardiac etiology.  Patient has no specific high risk exposure history for COVID-19, and does not meet current testing criteria.  We will obtain basic labs and troponin, give a fluid bolus, and reassess.  ----------------------------------------- 6:58 PM on 04/19/2018 -----------------------------------------  Lab work-up is reassuring.  The patient continues to appear relatively comfortable.  She has prednisone listed under her allergies although the symptoms described are more consistent with side effects rather than an allergy.  I initially considered avoiding giving a steroid to avoid the side effects, although the patient states that she only has a problem with prednisone oral pills but has gotten injections of corticosteroids without any issue.  I will give a dose of Decadron here.  In addition, I will prescribe azithromycin for any possible bacterial component of this and Xopenex for the patient's nebulizer.  The patient feels comfortable and is stable for discharge home at this time.  Return precautions given, and she expresses understanding.  _____  Angela [name] was evaluated in Emergency Department on 04/19/2018 for the symptoms described in the history of present illness. She was evaluated in the context  of the global COVID-19 pandemic, which necessitated consideration that the patient might be at risk for infection with the SARS-CoV-2 virus that causes COVID-19. Institutional protocols and algorithms that pertain to the evaluation of patients at risk for COVID-19 are in a state of rapid change based on information released by regulatory bodies including the CDC and federal and state organizations. These policies and algorithms were followed during the patient's care in the ED. ____________________________________________   FINAL CLINICAL IMPRESSION(S) / ED DIAGNOSES  Final diagnoses:  COPD exacerbation ([place])      NEW MEDICATIONS STARTED DURING THIS VISIT:  New Prescriptions   AZITHROMYCIN (ZITHROMAX Z-PAK) 250 MG TABLET    Take 2 tablets (500 mg) on  Day 1,  followed by 1 tablet (250 mg) once daily on Days 2 through 5.   FLUCONAZOLE (DIFLUCAN) 150 MG TABLET    Take 1 tablet (150 mg total) by mouth once for 1 dose.   LEVALBUTEROL (XOPENEX) 0.63 MG/3ML NEBULIZER SOLUTION    Take 3 mLs (0.63 mg total) by nebulization every 4 (four) hours as needed for wheezing or shortness of breath.     Note:  This document was prepared using Dragon voice recognition software and may include unintentional dictation errors.    Arta Silence, MD 04/19/18 1859

## 2018-04-19 NOTE — ED Triage Notes (Addendum)
Pt arrived via EMS from home. Pt states that she experiencing dizziness, chest pain that radiates to the left arm, shortness of breath and cough. The pt states that the cough and shortness of breath has been present for the past week. Pt is having difficult speaking and almost fell as she was walking through the tent with her walker. The pt states that she recently spoke with her PCP and now her symptoms are worse so she called EMS.  Pt is AOx4, bradycardic, she is having labored breathing and dyspnea at rest. Pt reports headache and diarrhea but denies any fevers at home.

## 2018-04-19 NOTE — ED Notes (Signed)
WHEN PT IS READY TO GO HOME, CALL RAY AT [phone]. RAY DROPPED PT OFF

## 2018-04-19 NOTE — Telephone Encounter (Signed)
Patient called and says she's looking for transportation to go to the hospital for testing, she says she has already answered a bunch of questions from the doctor and was told to go get testing. She says she's called several places and no one is offering transportation for seniors to get testing. I advised I am a triage nurse who answers calls for the practices and I would be glad to transfer the call back to her doctor's office, she says that's ok, I thought you were a part of the hospital, the she hung up the phone.

## 2018-04-24 ENCOUNTER — Telehealth: Payer: Self-pay

## 2018-04-24 DIAGNOSIS — F3164 Bipolar disorder, current episode mixed, severe, with psychotic features: Secondary | ICD-10-CM

## 2018-04-24 DIAGNOSIS — J449 Chronic obstructive pulmonary disease, unspecified: Secondary | ICD-10-CM

## 2018-04-24 DIAGNOSIS — F609 Personality disorder, unspecified: Secondary | ICD-10-CM

## 2018-04-24 DIAGNOSIS — F319 Bipolar disorder, unspecified: Secondary | ICD-10-CM

## 2018-04-24 NOTE — Telephone Encounter (Signed)
[  04/24/2018 1:39 PM]  Lionel December:   I sent a CRM yesterday on pt Angela [name]. She needs a new order/referral for Home Health. I spoke with [facility] yesterday. When they first went to see the patient, she was sick. Now they need a new referral before they can schedule

## 2018-04-24 NOTE — Telephone Encounter (Signed)
Returned patients call, patient is scheduled for phone visit tomorrow.     Copied from [place] [phone]. Topic: General - Inquiry >> Apr 24, 2018  8:52 AM Rainey Pines A wrote: Reason for CRM: Patient stated that she needs a callback from [facility] nurse. I explained to patient she was assisting another patient at the time. Patient asked that she give a call back.

## 2018-04-24 NOTE — Telephone Encounter (Signed)
That's fine, please refer

## 2018-04-25 ENCOUNTER — Ambulatory Visit (INDEPENDENT_AMBULATORY_CARE_PROVIDER_SITE_OTHER): Payer: Medicare Other | Admitting: Family Medicine

## 2018-04-25 ENCOUNTER — Telehealth: Payer: Self-pay | Admitting: Family Medicine

## 2018-04-25 ENCOUNTER — Other Ambulatory Visit: Payer: Self-pay

## 2018-04-25 ENCOUNTER — Encounter: Payer: Self-pay | Admitting: Family Medicine

## 2018-04-25 DIAGNOSIS — F3341 Major depressive disorder, recurrent, in partial remission: Secondary | ICD-10-CM

## 2018-04-25 DIAGNOSIS — M67979 Unspecified disorder of synovium and tendon, unspecified ankle and foot: Secondary | ICD-10-CM | POA: Diagnosis not present

## 2018-04-25 DIAGNOSIS — K449 Diaphragmatic hernia without obstruction or gangrene: Secondary | ICD-10-CM | POA: Diagnosis not present

## 2018-04-25 DIAGNOSIS — R600 Localized edema: Secondary | ICD-10-CM | POA: Diagnosis not present

## 2018-04-25 DIAGNOSIS — M722 Plantar fascial fibromatosis: Secondary | ICD-10-CM | POA: Diagnosis not present

## 2018-04-25 DIAGNOSIS — E039 Hypothyroidism, unspecified: Secondary | ICD-10-CM

## 2018-04-25 DIAGNOSIS — J441 Chronic obstructive pulmonary disease with (acute) exacerbation: Secondary | ICD-10-CM

## 2018-04-25 DIAGNOSIS — F4310 Post-traumatic stress disorder, unspecified: Secondary | ICD-10-CM

## 2018-04-25 DIAGNOSIS — Z79899 Other long term (current) drug therapy: Secondary | ICD-10-CM

## 2018-04-25 DIAGNOSIS — G4700 Insomnia, unspecified: Secondary | ICD-10-CM

## 2018-04-25 MED ORDER — UNABLE TO FIND: 0 refills | Status: DC

## 2018-04-25 NOTE — Assessment & Plan Note (Signed)
Seeing psychiatrist

## 2018-04-25 NOTE — Progress Notes (Addendum)
There were no vitals taken for this visit.   Subjective:    Patient ID: Angela [name], female    DOB: 02/20/1950, 68 y.o.   MRN: [id number]  HPI: Angela [name] is a 68 y.o. female  Chief Complaint  Patient presents with   URI   Insomnia   Medication Management    vitamin discussion   Referral    HPI Virtual Visit via Telephone/Video Note   I connect with the patient via: telephone I verified that I am speaking with the correct person using two identifiers.  Call started: 2:55 pm Call terminated: 3:40 pm  Total length of call: 45 minutes and 10 seconds   I discussed the limitations, risks, security, and privacy concerns of performing an evaluation and management service by telephone and the availability of in-person appointments. Staff discussed with the patient that he/she may be responsible for charges related to this service. The patient expressed understanding and agreed to proceed.  Patient location: dishwasher exploded all over the floor, it's Easter and I have a lot of cooking to do... (I asked if she was in a safe secure location), talked about someone being on his knees and complex refusing to do anything, "humiliating responses", "I'm tired of it", "it floods"; I tried to redirect to ask if she's in a secure location, at home Provider location: home office Additional participants: no one  She says her breathing is phlegmy; she went to the hospital; he gave her a z-pak They gave her a shot of decadron, "there are very few good doctors in the ER" The ER doctor read her the labs and she was "blessed to have him"; everything looked "fine" she says; she wasn't tested for COVID-19; [facility] is going to come out and assess her Saturday and will start home care She is having wheezing too when I asked; she says "he (Dr. Nehemiah Settle) is the first doctor... I want to get rid of Dr. Raul Del in my life", she learned there is something else to do for COPD; she says  "this should have been going on years ago"; she wants respiratory therapy (ordered)  She has dry skin on her legs; has leg edema; would like elevated recliner to raise legs for edema; she would like a recliner that helps her stand up, automatic; chair to help her raise up to stand up; bilateral achilles problems and is going to have surgery; any company per her  MRIs were done in February through [facility] and she has not heard the results yet, "these were done a long time ago and I haven't heard from the office", "I can't even get through" Dr. Governor Specking at [place]; I asked my staff to contact his office and ask them to call her just now  She wanted me to research Algae-[place], "it's a whole new concept out of [place]"; "this is something harvested from the shores of [place]"; "it's natural"; I explained that if not regulated by FDA, I can't really support any particular brand of supplement over another; she says "you grow bones back with this stuff"; she wants to take strontium; she just wanted my opinion on taking certain things at the same; I encouraged her to talk to her pharmacist about this; do not take anything with synthroid I explained  She wanted to know about "general malaise" and her general depression; she is going to start [facility] in [place]; it's a magnetic brain stimulation; she is all for getting off of pills;  she spoke with her psychiatrist about it after she read about this on-line; psychiatrist said "go for it"; she went to [facility], they were "ecstatic", "you are the perfect candidate"; this is not ECT, does not erase memory; focuses on the area of the brain that needs endorphins, dopamine; sends a little charge through your skull, "it's fascinating, doctor, I love this kind of stuff"; this doctor is a Scientist, research (life sciences) and they had a connection right off, talking about [place], "it was wonderful"; he thinks she is going to do very well in the program; five days a week, long-term, but  she can't do it right now with her symptoms, can't get on a bus, not until she is better and COVID-19 pandemic slows down; she talks about losing her eye glasses, and can't even get fitted for eye glasses right now; everything is up in the air and it's disorienting and horrible; not sleeping well because of her anxiety; she has equipment that has to be ordered, "my dishwasher exploded" and "I don't have any way to calm my mind"; she asked if I have heard about "neural balance", "oh gosh, that's a shame"; "it calms the nervous system", mostly for children and it's not a sleeping pill; general calms your entire persona; explained that she could talk to her pharmacist; she asked if I had any recommendations; she wanted to know if I knew about Dr. Ardeth Perfect who stopped doing heart surgery and he has become very famous with supplements (I have not); she ordered some of his products called "total restore"; she noticed that her hair is twice as thick; "now, I have a mane"; she says "I haven't slept in a year or two years", and now she is able to think and talk and cook on this new product; he was a classmate of hers at [employer]; it repopulates your gut and puts the right kind of things in there; she feels a different; she was dual major photography and anthropology  Depression screen [facility] 2/9 04/12/2018 02/26/2018 02/08/2018 01/28/2018 12/07/2017  Decreased Interest 2 2 0 0 3  Down, Depressed, Hopeless 2 1 1 1 3   PHQ - 2 Score 4 3 1 1 6   Altered sleeping 2 3 3 3 3   Tired, decreased energy 2 3 3 2 3   Change in appetite 0 3 3 2 3   Feeling bad or failure about yourself  0 1 1 0 3  Trouble concentrating 0 3 3 1 3   Moving slowly or fidgety/restless 1 0 0 0 0  Suicidal thoughts 0 0 0 0 0  PHQ-9 Score 9 16 14 9 21   Difficult doing work/chores Very difficult Extremely dIfficult - - -  Some recent data might be hidden   Fall Risk  04/12/2018 02/26/2018 02/08/2018 01/28/2018 12/07/2017  Falls in the past year? 1 1 1 1 1   Number  falls in past yr: 0 1 1 1 1   Injury with Fall? 0 1 1 1 1   Risk for fall due to : - - - - -  Follow up - - Falls prevention discussed - -    Relevant past medical, surgical, family and social history reviewed Past Medical History:  Diagnosis Date   Allergic rhinitis    Anemia    Blurred vision    Depression    Diverticulosis    Endometriosis    Falls    GERD (gastroesophageal reflux disease)    Hernia, hiatal    Hypothyroidism    IBS (irritable bowel syndrome)  Malignant neoplasm of skin    Migraine    Neuropathy    PTSD (post-traumatic stress disorder)    Thyroid disease    Past Surgical History:  Procedure Laterality Date   APPENDECTOMY     CHOLECYSTECTOMY     CHOLECYSTECTOMY, LAPAROSCOPIC     ESOPHAGOGASTRODUODENOSCOPY (EGD) WITH PROPOFOL N/A 03/29/2015   Procedure: ESOPHAGOGASTRODUODENOSCOPY (EGD) WITH PROPOFOL;  Surgeon: Josefine Class, MD;  [facility];  Service: Endoscopy;  Laterality: N/A;   HERNIA REPAIR     laproscopy     TONSILLECTOMY     uteral suspension     Family History  Problem Relation Age of Onset   Heart disease Father    Hearing loss Father    COPD Father    Depression Father    Stroke Father    Vision loss Father    Varicose Veins Father    Cancer Mother        lung   Depression Sister    Arthritis Sister    Arthritis Brother    Hernia Brother    Anxiety disorder Brother    Arthritis Brother    Hernia Brother    Anxiety disorder Brother    Urolithiasis Neg Hx    Kidney disease Neg Hx    Kidney cancer Neg Hx    Prostate cancer Neg Hx    Social History   Tobacco Use   Smoking status: Former Smoker    Last attempt to quit: 11/17/1976    Years since quitting: 41.4   Smokeless tobacco: Never Used  Substance Use Topics   Alcohol use: No   Drug use: No     [facility] from [facility]/12/2018 in [facility]  AUDIT-C Score  0      Interim medical  history since last visit reviewed. Allergies and medications reviewed  Review of Systems Per HPI unless specifically indicated above     Objective:    There were no vitals taken for this visit.  Wt Readings from Last 3 Encounters:  04/19/18 125 lb (56.7 kg)  02/26/18 137 lb 4.8 oz (62.3 kg)  02/18/18 130 lb 1.1 oz (59 kg)    Physical Exam Pulmonary:     Effort: No respiratory distress.  Neurological:     Mental Status: She is alert.     Cranial Nerves: No dysarthria.  Psychiatric:        Speech: Speech is not rapid and pressured, delayed or slurred.    Results for orders placed or performed during the hospital encounter of 41/66/06  Basic metabolic panel  Result Value Ref Range   Sodium 138 135 - 145 mmol/L   Potassium 3.9 3.5 - 5.1 mmol/L   Chloride 102 98 - 111 mmol/L   CO2 26 22 - 32 mmol/L   Glucose, Bld 95 70 - 99 mg/dL   BUN 15 8 - 23 mg/dL   Creatinine, Ser 0.60 0.44 - 1.00 mg/dL   Calcium 9.2 8.9 - 10.3 mg/dL   GFR calc non Af Amer >60 >60 mL/min   GFR calc Af Amer >60 >60 mL/min   Anion gap 10 5 - 15  CBC with Differential  Result Value Ref Range   WBC 4.6 4.0 - 10.5 K/uL   RBC 3.93 3.87 - 5.11 MIL/uL   Hemoglobin 12.1 12.0 - 15.0 g/dL   HCT 36.7 36.0 - 46.0 %   MCV 93.4 80.0 - 100.0 fL   MCH 30.8 26.0 - 34.0 pg  MCHC 33.0 30.0 - 36.0 g/dL   RDW 13.0 11.5 - 15.5 %   Platelets 277 150 - 400 K/uL   nRBC 0.0 0.0 - 0.2 %   Neutrophils Relative % 47 %   Neutro Abs 2.2 1.7 - 7.7 K/uL   Lymphocytes Relative 35 %   Lymphs Abs 1.6 0.7 - 4.0 K/uL   Monocytes Relative 13 %   Monocytes Absolute 0.6 0.1 - 1.0 K/uL   Eosinophils Relative 4 %   Eosinophils Absolute 0.2 0.0 - 0.5 K/uL   Basophils Relative 1 %   Basophils Absolute 0.0 0.0 - 0.1 K/uL   Immature Granulocytes 0 %   Abs Immature Granulocytes 0.01 0.00 - 0.07 K/uL  Troponin I - Once  Result Value Ref Range   Troponin I <0.03 <0.03 ng/mL      Assessment & Plan:   Problem List Items Addressed  This Visit      Endocrine   Hypothyroidism    Do not take anything else with synthroid; just by itself with water        Musculoskeletal and Integument   Plantar fasciitis, bilateral    Will order chair with reclining leg rest and automatic      Achilles tendon disorder    Request recliner with elevated foot rest        Other   Recurrent major depressive disorder, in partial remission [facility])    Seeing psychiatrist; she is planning to start new therapy in [place] once COVID-19 pandemic slows down and she feels comfortable getting on a bus      PTSD (post-traumatic stress disorder)    Seeing psychiatrist      Polypharmacy    Talk to pharmacist about drug-drug interactions, see if anything may interact or interfere with absorption      Insomnia    Turn off all electronics for two hours before bed; avoid caffeine and chocolate for ten hours before bed; calming activities in the evening, reading, crossword puzzles, word searches; meditate or do yoga before bed      Hernia, hiatal    Order recliner to allow elevation of head/torso and raise feet for other health problems       Other Visit Diagnoses    COPD with acute exacerbation ([place])    -  Primary   recent ER visit; finish out zpak; refer to respiratory therapy per her request; she does not want to see pulmonologist any more   Relevant Orders   Ambulatory referral to Respiratory Therapy   Bilateral leg edema           Follow up plan: Return in about 1 month (around 05/25/2018) for follow-up visit with Dr. Sanda Klein by phone.   Meds ordered this encounter  Medications   UNABLE TO FIND    Sig: Recliner with elevated leg rest, automatic and raises off of the floor to assist with standing    Dispense:  1 each    Refill:  0    Orders Placed This Encounter  Procedures   Ambulatory referral to Respiratory Therapy

## 2018-04-25 NOTE — Assessment & Plan Note (Signed)
Seeing psychiatrist; she is planning to start new therapy in [place] once COVID-19 pandemic slows down and she feels comfortable getting on a bus

## 2018-04-25 NOTE — Assessment & Plan Note (Signed)
Order recliner to allow elevation of head/torso and raise feet for other health problems

## 2018-04-25 NOTE — Assessment & Plan Note (Signed)
Turn off all electronics for two hours before bed; avoid caffeine and chocolate for ten hours before bed; calming activities in the evening, reading, crossword puzzles, word searches; meditate or do yoga before bed

## 2018-04-25 NOTE — Assessment & Plan Note (Signed)
Do not take anything else with synthroid; just by itself with water

## 2018-04-25 NOTE — Assessment & Plan Note (Signed)
Talk to pharmacist about drug-drug interactions, see if anything may interact or interfere with absorption

## 2018-04-25 NOTE — Assessment & Plan Note (Signed)
Request recliner with elevated foot rest

## 2018-04-25 NOTE — Telephone Encounter (Signed)
Please schedule patient for a one month follow-up with Dr. Sanda Klein, telephone/telehealth visit Dx: COPD, hiatal hernia

## 2018-04-25 NOTE — Assessment & Plan Note (Signed)
Will order chair with reclining leg rest and automatic

## 2018-04-26 NOTE — Telephone Encounter (Signed)
VM not set up.

## 2018-04-27 DIAGNOSIS — I051 Rheumatic mitral insufficiency: Secondary | ICD-10-CM | POA: Diagnosis not present

## 2018-04-27 DIAGNOSIS — M791 Myalgia, unspecified site: Secondary | ICD-10-CM | POA: Diagnosis not present

## 2018-04-27 DIAGNOSIS — G629 Polyneuropathy, unspecified: Secondary | ICD-10-CM | POA: Diagnosis not present

## 2018-04-27 DIAGNOSIS — M50122 Cervical disc disorder at C5-C6 level with radiculopathy: Secondary | ICD-10-CM | POA: Diagnosis not present

## 2018-04-27 DIAGNOSIS — J441 Chronic obstructive pulmonary disease with (acute) exacerbation: Secondary | ICD-10-CM | POA: Diagnosis not present

## 2018-04-27 DIAGNOSIS — I10 Essential (primary) hypertension: Secondary | ICD-10-CM | POA: Diagnosis not present

## 2018-04-27 DIAGNOSIS — M4712 Other spondylosis with myelopathy, cervical region: Secondary | ICD-10-CM | POA: Diagnosis not present

## 2018-04-27 DIAGNOSIS — M19049 Primary osteoarthritis, unspecified hand: Secondary | ICD-10-CM | POA: Diagnosis not present

## 2018-04-27 DIAGNOSIS — G43911 Migraine, unspecified, intractable, with status migrainosus: Secondary | ICD-10-CM | POA: Diagnosis not present

## 2018-04-27 DIAGNOSIS — M722 Plantar fascial fibromatosis: Secondary | ICD-10-CM | POA: Diagnosis not present

## 2018-04-27 DIAGNOSIS — G5603 Carpal tunnel syndrome, bilateral upper limbs: Secondary | ICD-10-CM | POA: Diagnosis not present

## 2018-04-29 ENCOUNTER — Telehealth: Payer: Self-pay | Admitting: Family Medicine

## 2018-04-29 NOTE — Telephone Encounter (Signed)
That's fine.  Thank you.

## 2018-04-29 NOTE — Telephone Encounter (Signed)
Left detailed voicemail

## 2018-04-29 NOTE — Telephone Encounter (Signed)
Copied from [place] [id number]. Topic: Quick Communication - Home Health Verbal Orders >> Apr 29, 2018 12:43 PM Alanda Slim E wrote: Caller/Agency: Mariel Kansky / [facility]  Callback Number: [id number] Requesting Skilled Nursing, Home health aid, OT and social work  Frequency: SN 2x a week for 2 weeks and 1x a week for 2 weeks Home health aid  2x a week for 4 weeks  OT eval and Social work eval

## 2018-04-30 ENCOUNTER — Other Ambulatory Visit: Payer: Self-pay | Admitting: Psychiatry

## 2018-05-01 DIAGNOSIS — J441 Chronic obstructive pulmonary disease with (acute) exacerbation: Secondary | ICD-10-CM | POA: Diagnosis not present

## 2018-05-01 DIAGNOSIS — G629 Polyneuropathy, unspecified: Secondary | ICD-10-CM | POA: Diagnosis not present

## 2018-05-01 DIAGNOSIS — M50122 Cervical disc disorder at C5-C6 level with radiculopathy: Secondary | ICD-10-CM | POA: Diagnosis not present

## 2018-05-01 DIAGNOSIS — M722 Plantar fascial fibromatosis: Secondary | ICD-10-CM | POA: Diagnosis not present

## 2018-05-01 DIAGNOSIS — M4712 Other spondylosis with myelopathy, cervical region: Secondary | ICD-10-CM | POA: Diagnosis not present

## 2018-05-01 DIAGNOSIS — M791 Myalgia, unspecified site: Secondary | ICD-10-CM | POA: Diagnosis not present

## 2018-05-01 DIAGNOSIS — I10 Essential (primary) hypertension: Secondary | ICD-10-CM | POA: Diagnosis not present

## 2018-05-01 DIAGNOSIS — G5603 Carpal tunnel syndrome, bilateral upper limbs: Secondary | ICD-10-CM | POA: Diagnosis not present

## 2018-05-01 DIAGNOSIS — M19049 Primary osteoarthritis, unspecified hand: Secondary | ICD-10-CM | POA: Diagnosis not present

## 2018-05-01 DIAGNOSIS — I051 Rheumatic mitral insufficiency: Secondary | ICD-10-CM | POA: Diagnosis not present

## 2018-05-01 DIAGNOSIS — G43911 Migraine, unspecified, intractable, with status migrainosus: Secondary | ICD-10-CM | POA: Diagnosis not present

## 2018-05-02 ENCOUNTER — Telehealth: Payer: Self-pay | Admitting: Family Medicine

## 2018-05-02 DIAGNOSIS — M50122 Cervical disc disorder at C5-C6 level with radiculopathy: Secondary | ICD-10-CM | POA: Diagnosis not present

## 2018-05-02 DIAGNOSIS — M4712 Other spondylosis with myelopathy, cervical region: Secondary | ICD-10-CM | POA: Diagnosis not present

## 2018-05-02 DIAGNOSIS — M19049 Primary osteoarthritis, unspecified hand: Secondary | ICD-10-CM | POA: Diagnosis not present

## 2018-05-02 DIAGNOSIS — I051 Rheumatic mitral insufficiency: Secondary | ICD-10-CM | POA: Diagnosis not present

## 2018-05-02 DIAGNOSIS — G43911 Migraine, unspecified, intractable, with status migrainosus: Secondary | ICD-10-CM | POA: Diagnosis not present

## 2018-05-02 DIAGNOSIS — G5603 Carpal tunnel syndrome, bilateral upper limbs: Secondary | ICD-10-CM | POA: Diagnosis not present

## 2018-05-02 DIAGNOSIS — J441 Chronic obstructive pulmonary disease with (acute) exacerbation: Secondary | ICD-10-CM | POA: Diagnosis not present

## 2018-05-02 DIAGNOSIS — M791 Myalgia, unspecified site: Secondary | ICD-10-CM | POA: Diagnosis not present

## 2018-05-02 DIAGNOSIS — M722 Plantar fascial fibromatosis: Secondary | ICD-10-CM | POA: Diagnosis not present

## 2018-05-02 DIAGNOSIS — G629 Polyneuropathy, unspecified: Secondary | ICD-10-CM | POA: Diagnosis not present

## 2018-05-02 DIAGNOSIS — I10 Essential (primary) hypertension: Secondary | ICD-10-CM | POA: Diagnosis not present

## 2018-05-02 NOTE — Telephone Encounter (Signed)
Copied from [place] [id number]. Topic: Quick Communication - Home Health Verbal Orders >> May 02, 2018  3:51 PM Berneta Levins wrote: Caller/Agency: Colletta Maryland with [facility] Number: [phone] Requesting OT/PT/Skilled Nursing/Social Work/Speech Therapy: OT and PT Frequency:  OT - 1x a week for one week, 2x a week for three weeks PT - evaluation

## 2018-05-03 ENCOUNTER — Encounter: Payer: Self-pay | Admitting: Podiatry

## 2018-05-03 ENCOUNTER — Ambulatory Visit (INDEPENDENT_AMBULATORY_CARE_PROVIDER_SITE_OTHER): Payer: Medicare Other | Admitting: Podiatry

## 2018-05-03 ENCOUNTER — Other Ambulatory Visit: Payer: Self-pay

## 2018-05-03 VITALS — Temp 96.5°F

## 2018-05-03 DIAGNOSIS — M659 Synovitis and tenosynovitis, unspecified: Secondary | ICD-10-CM

## 2018-05-03 DIAGNOSIS — M791 Myalgia, unspecified site: Secondary | ICD-10-CM | POA: Diagnosis not present

## 2018-05-03 DIAGNOSIS — I051 Rheumatic mitral insufficiency: Secondary | ICD-10-CM | POA: Diagnosis not present

## 2018-05-03 DIAGNOSIS — M4712 Other spondylosis with myelopathy, cervical region: Secondary | ICD-10-CM | POA: Diagnosis not present

## 2018-05-03 DIAGNOSIS — M19049 Primary osteoarthritis, unspecified hand: Secondary | ICD-10-CM | POA: Diagnosis not present

## 2018-05-03 DIAGNOSIS — M79676 Pain in unspecified toe(s): Secondary | ICD-10-CM

## 2018-05-03 DIAGNOSIS — B351 Tinea unguium: Secondary | ICD-10-CM

## 2018-05-03 DIAGNOSIS — M50122 Cervical disc disorder at C5-C6 level with radiculopathy: Secondary | ICD-10-CM | POA: Diagnosis not present

## 2018-05-03 DIAGNOSIS — I10 Essential (primary) hypertension: Secondary | ICD-10-CM | POA: Diagnosis not present

## 2018-05-03 DIAGNOSIS — G43911 Migraine, unspecified, intractable, with status migrainosus: Secondary | ICD-10-CM | POA: Diagnosis not present

## 2018-05-03 DIAGNOSIS — G5603 Carpal tunnel syndrome, bilateral upper limbs: Secondary | ICD-10-CM | POA: Diagnosis not present

## 2018-05-03 DIAGNOSIS — J441 Chronic obstructive pulmonary disease with (acute) exacerbation: Secondary | ICD-10-CM | POA: Diagnosis not present

## 2018-05-03 DIAGNOSIS — G629 Polyneuropathy, unspecified: Secondary | ICD-10-CM | POA: Diagnosis not present

## 2018-05-03 DIAGNOSIS — M722 Plantar fascial fibromatosis: Secondary | ICD-10-CM | POA: Diagnosis not present

## 2018-05-03 NOTE — Telephone Encounter (Signed)
Left detailed VM with [name]. CRM created.

## 2018-05-03 NOTE — Progress Notes (Signed)
   HPI: Well established patient presents today for follow-up treatment of bilateral foot and ankle pain.  Patient has diffuse foot and ankle pain that is been ongoing for several years.  Patient recently had an MRI performed at [facility] and she is waiting for MRI results She also complains of thickened, elongated, dystrophic nails 1-5 bilateral.  Pain with shoe gear.  She is unable to trim her own toenails.  She would like to have them trimmed today  Past Medical History:  Diagnosis Date  . Allergic rhinitis   . Anemia   . Blurred vision   . Depression   . Diverticulosis   . Endometriosis   . Falls   . GERD (gastroesophageal reflux disease)   . Hernia, hiatal   . Hypothyroidism   . IBS (irritable bowel syndrome)   . Malignant neoplasm of skin   . Migraine   . Neuropathy   . PTSD (post-traumatic stress disorder)   . Thyroid disease      Physical Exam: General: The patient is alert and oriented x3 in no acute distress.  Dermatology: Skin is warm, dry and supple bilateral lower extremities. Negative for open lesions or macerations.  Elongated, dystrophic, hyperkeratotic nails noted 1-5 bilateral  Vascular: Palpable pedal pulses bilaterally. No edema or erythema noted. Capillary refill within normal limits.  Neurological: Epicritic and protective threshold grossly intact bilaterally.   Musculoskeletal Exam: Range of motion within normal limits to all pedal and ankle joints bilateral. Muscle strength 5/5 in all groups bilateral.  Significant pain on palpation to the lateral aspect of the bilateral ankle joints  Assessment: 1.  Bilateral ankle synovitis/DJD 2.  Pain due to onychomycosis of toenails   Plan of Care:  1. Patient evaluated.   2.  Injection of 0.5 cc Celestone Soluspan injection of the bilateral ankle joints lateral aspects 3.  Mechanical debridement of nails 1-5 performed bilateral 4.  Recommend that the patient follow-up with [facility] to review  MRI results 5.  Return to clinic as needed      Edrick Kins, DPM [facility]  Dr. Edrick Kins, DPM    [address], [place] [address]                Office [phone]  Fax [phone]

## 2018-05-03 NOTE — Telephone Encounter (Signed)
Orders approved. Thank you.

## 2018-05-06 DIAGNOSIS — M4712 Other spondylosis with myelopathy, cervical region: Secondary | ICD-10-CM | POA: Diagnosis not present

## 2018-05-06 DIAGNOSIS — G629 Polyneuropathy, unspecified: Secondary | ICD-10-CM | POA: Diagnosis not present

## 2018-05-06 DIAGNOSIS — I051 Rheumatic mitral insufficiency: Secondary | ICD-10-CM | POA: Diagnosis not present

## 2018-05-06 DIAGNOSIS — G5603 Carpal tunnel syndrome, bilateral upper limbs: Secondary | ICD-10-CM | POA: Diagnosis not present

## 2018-05-06 DIAGNOSIS — G43911 Migraine, unspecified, intractable, with status migrainosus: Secondary | ICD-10-CM | POA: Diagnosis not present

## 2018-05-06 DIAGNOSIS — I10 Essential (primary) hypertension: Secondary | ICD-10-CM | POA: Diagnosis not present

## 2018-05-06 DIAGNOSIS — M19049 Primary osteoarthritis, unspecified hand: Secondary | ICD-10-CM | POA: Diagnosis not present

## 2018-05-06 DIAGNOSIS — M791 Myalgia, unspecified site: Secondary | ICD-10-CM | POA: Diagnosis not present

## 2018-05-06 DIAGNOSIS — M722 Plantar fascial fibromatosis: Secondary | ICD-10-CM | POA: Diagnosis not present

## 2018-05-06 DIAGNOSIS — M50122 Cervical disc disorder at C5-C6 level with radiculopathy: Secondary | ICD-10-CM | POA: Diagnosis not present

## 2018-05-06 DIAGNOSIS — J441 Chronic obstructive pulmonary disease with (acute) exacerbation: Secondary | ICD-10-CM | POA: Diagnosis not present

## 2018-05-09 ENCOUNTER — Telehealth: Payer: Self-pay | Admitting: Family Medicine

## 2018-05-09 DIAGNOSIS — M50122 Cervical disc disorder at C5-C6 level with radiculopathy: Secondary | ICD-10-CM | POA: Diagnosis not present

## 2018-05-09 DIAGNOSIS — J441 Chronic obstructive pulmonary disease with (acute) exacerbation: Secondary | ICD-10-CM | POA: Diagnosis not present

## 2018-05-09 DIAGNOSIS — M4712 Other spondylosis with myelopathy, cervical region: Secondary | ICD-10-CM | POA: Diagnosis not present

## 2018-05-09 DIAGNOSIS — G43911 Migraine, unspecified, intractable, with status migrainosus: Secondary | ICD-10-CM | POA: Diagnosis not present

## 2018-05-09 DIAGNOSIS — G629 Polyneuropathy, unspecified: Secondary | ICD-10-CM | POA: Diagnosis not present

## 2018-05-09 DIAGNOSIS — I10 Essential (primary) hypertension: Secondary | ICD-10-CM | POA: Diagnosis not present

## 2018-05-09 DIAGNOSIS — I051 Rheumatic mitral insufficiency: Secondary | ICD-10-CM | POA: Diagnosis not present

## 2018-05-09 DIAGNOSIS — M19049 Primary osteoarthritis, unspecified hand: Secondary | ICD-10-CM | POA: Diagnosis not present

## 2018-05-09 DIAGNOSIS — G5603 Carpal tunnel syndrome, bilateral upper limbs: Secondary | ICD-10-CM | POA: Diagnosis not present

## 2018-05-09 DIAGNOSIS — M791 Myalgia, unspecified site: Secondary | ICD-10-CM | POA: Diagnosis not present

## 2018-05-09 DIAGNOSIS — M722 Plantar fascial fibromatosis: Secondary | ICD-10-CM | POA: Diagnosis not present

## 2018-05-09 NOTE — Telephone Encounter (Signed)
This encounter was created in error - please disregard.

## 2018-05-09 NOTE — Telephone Encounter (Signed)
Rec'd phone call from pt's [place], through [facility], attempting to reach Dr. Delight Ovens nurse.  Call came in at 4:00 PM, after the office was closed.  Attempted to ask what the pt. needed at this time.  The patient was very upset and answered very abruptly, "I'm trying to get in touch with Dr. Delight Ovens nurse Roselyn Reef!  They know what this is about!"  With further inquiry about what the patient needed, she became even more irate, and stated "I just need to talk with Roselyn Reef; I wanted my Home Health nurse to be here when I called the office, and now they're closed!"  Attempted to call into office at 4:06 PM, and did not receive an answer.  The pt. demanded that I schedule a Telemedicine appt. with Dr. Sanda Klein.  Advised that I can send her request to the office, and someone will call back tomorrow to offer an appt.  The pt. continued yelling and stated "I am done with that office!"  Triage nurse again asked what the pt. was experiencing, and she yelled "you've asked enough questions, now let's move on!"  Per [place], she stated the pt. had questions about upper respiratory symptoms.  The patient requested to have the office contact the [facility] nurse, Cletis Athens, tomorrow, re: scheduling an appt.  Per Cletis Athens, she can be reached at [phone].  Advised will send this note to office with high priority to return call to [facility] nurse, on behalf of the patient.

## 2018-05-10 DIAGNOSIS — G629 Polyneuropathy, unspecified: Secondary | ICD-10-CM | POA: Diagnosis not present

## 2018-05-10 DIAGNOSIS — J441 Chronic obstructive pulmonary disease with (acute) exacerbation: Secondary | ICD-10-CM | POA: Diagnosis not present

## 2018-05-10 DIAGNOSIS — I051 Rheumatic mitral insufficiency: Secondary | ICD-10-CM | POA: Diagnosis not present

## 2018-05-10 DIAGNOSIS — M19049 Primary osteoarthritis, unspecified hand: Secondary | ICD-10-CM | POA: Diagnosis not present

## 2018-05-10 DIAGNOSIS — M50122 Cervical disc disorder at C5-C6 level with radiculopathy: Secondary | ICD-10-CM | POA: Diagnosis not present

## 2018-05-10 DIAGNOSIS — I10 Essential (primary) hypertension: Secondary | ICD-10-CM | POA: Diagnosis not present

## 2018-05-10 DIAGNOSIS — M791 Myalgia, unspecified site: Secondary | ICD-10-CM | POA: Diagnosis not present

## 2018-05-10 DIAGNOSIS — M722 Plantar fascial fibromatosis: Secondary | ICD-10-CM | POA: Diagnosis not present

## 2018-05-10 DIAGNOSIS — G5603 Carpal tunnel syndrome, bilateral upper limbs: Secondary | ICD-10-CM | POA: Diagnosis not present

## 2018-05-10 DIAGNOSIS — M4712 Other spondylosis with myelopathy, cervical region: Secondary | ICD-10-CM | POA: Diagnosis not present

## 2018-05-10 DIAGNOSIS — G43911 Migraine, unspecified, intractable, with status migrainosus: Secondary | ICD-10-CM | POA: Diagnosis not present

## 2018-05-10 NOTE — Telephone Encounter (Signed)
appt scheduled for next Tuesday with Home health nurse to be there @2 :00

## 2018-05-10 NOTE — Telephone Encounter (Signed)
LVM for Angela [name] the patient's [facility] nurse to call our office so that we can schedule a virtual appointment for patient per the request of the patient.

## 2018-05-13 DIAGNOSIS — M50122 Cervical disc disorder at C5-C6 level with radiculopathy: Secondary | ICD-10-CM | POA: Diagnosis not present

## 2018-05-13 DIAGNOSIS — J441 Chronic obstructive pulmonary disease with (acute) exacerbation: Secondary | ICD-10-CM | POA: Diagnosis not present

## 2018-05-13 DIAGNOSIS — G629 Polyneuropathy, unspecified: Secondary | ICD-10-CM | POA: Diagnosis not present

## 2018-05-13 DIAGNOSIS — M19049 Primary osteoarthritis, unspecified hand: Secondary | ICD-10-CM | POA: Diagnosis not present

## 2018-05-13 DIAGNOSIS — M4712 Other spondylosis with myelopathy, cervical region: Secondary | ICD-10-CM | POA: Diagnosis not present

## 2018-05-13 DIAGNOSIS — G5603 Carpal tunnel syndrome, bilateral upper limbs: Secondary | ICD-10-CM | POA: Diagnosis not present

## 2018-05-13 DIAGNOSIS — I10 Essential (primary) hypertension: Secondary | ICD-10-CM | POA: Diagnosis not present

## 2018-05-13 DIAGNOSIS — M791 Myalgia, unspecified site: Secondary | ICD-10-CM | POA: Diagnosis not present

## 2018-05-13 DIAGNOSIS — G43911 Migraine, unspecified, intractable, with status migrainosus: Secondary | ICD-10-CM | POA: Diagnosis not present

## 2018-05-13 DIAGNOSIS — M722 Plantar fascial fibromatosis: Secondary | ICD-10-CM | POA: Diagnosis not present

## 2018-05-13 DIAGNOSIS — I051 Rheumatic mitral insufficiency: Secondary | ICD-10-CM | POA: Diagnosis not present

## 2018-05-14 ENCOUNTER — Encounter: Payer: Self-pay | Admitting: Nurse Practitioner

## 2018-05-14 ENCOUNTER — Other Ambulatory Visit: Payer: Self-pay

## 2018-05-14 ENCOUNTER — Ambulatory Visit (INDEPENDENT_AMBULATORY_CARE_PROVIDER_SITE_OTHER): Payer: Medicare Other | Admitting: Nurse Practitioner

## 2018-05-14 VITALS — BP 111/86 | HR 75 | Temp 97.1°F | Resp 18

## 2018-05-14 DIAGNOSIS — J449 Chronic obstructive pulmonary disease, unspecified: Secondary | ICD-10-CM

## 2018-05-14 DIAGNOSIS — T3695XA Adverse effect of unspecified systemic antibiotic, initial encounter: Secondary | ICD-10-CM

## 2018-05-14 DIAGNOSIS — M50122 Cervical disc disorder at C5-C6 level with radiculopathy: Secondary | ICD-10-CM | POA: Diagnosis not present

## 2018-05-14 DIAGNOSIS — H04123 Dry eye syndrome of bilateral lacrimal glands: Secondary | ICD-10-CM

## 2018-05-14 DIAGNOSIS — I051 Rheumatic mitral insufficiency: Secondary | ICD-10-CM | POA: Diagnosis not present

## 2018-05-14 DIAGNOSIS — B379 Candidiasis, unspecified: Secondary | ICD-10-CM

## 2018-05-14 DIAGNOSIS — M4712 Other spondylosis with myelopathy, cervical region: Secondary | ICD-10-CM | POA: Diagnosis not present

## 2018-05-14 DIAGNOSIS — J441 Chronic obstructive pulmonary disease with (acute) exacerbation: Secondary | ICD-10-CM | POA: Diagnosis not present

## 2018-05-14 DIAGNOSIS — M791 Myalgia, unspecified site: Secondary | ICD-10-CM | POA: Diagnosis not present

## 2018-05-14 DIAGNOSIS — I10 Essential (primary) hypertension: Secondary | ICD-10-CM | POA: Diagnosis not present

## 2018-05-14 DIAGNOSIS — M19049 Primary osteoarthritis, unspecified hand: Secondary | ICD-10-CM | POA: Diagnosis not present

## 2018-05-14 DIAGNOSIS — J0140 Acute pansinusitis, unspecified: Secondary | ICD-10-CM

## 2018-05-14 DIAGNOSIS — R2681 Unsteadiness on feet: Secondary | ICD-10-CM | POA: Diagnosis not present

## 2018-05-14 DIAGNOSIS — M722 Plantar fascial fibromatosis: Secondary | ICD-10-CM | POA: Diagnosis not present

## 2018-05-14 DIAGNOSIS — G629 Polyneuropathy, unspecified: Secondary | ICD-10-CM | POA: Diagnosis not present

## 2018-05-14 DIAGNOSIS — G43911 Migraine, unspecified, intractable, with status migrainosus: Secondary | ICD-10-CM | POA: Diagnosis not present

## 2018-05-14 DIAGNOSIS — G5603 Carpal tunnel syndrome, bilateral upper limbs: Secondary | ICD-10-CM | POA: Diagnosis not present

## 2018-05-14 MED ORDER — FLUCONAZOLE 150 MG PO TABS: 150.0000 mg | ORAL_TABLET | Freq: Once | ORAL | 0 refills | Status: AC

## 2018-05-14 MED ORDER — DOXYCYCLINE HYCLATE 100 MG PO TABS: 100.0000 mg | ORAL_TABLET | Freq: Two times a day (BID) | ORAL | 0 refills | Status: DC

## 2018-05-14 MED ORDER — CYCLOSPORINE 0.05 % OP EMUL: 1.0000 [drp] | Freq: Two times a day (BID) | OPHTHALMIC | 1 refills | Status: DC

## 2018-05-14 NOTE — Progress Notes (Signed)
Virtual Visit via Video Note  I connected with Angela [name] on 05/14/18 at  2:00 PM EDT by a video enabled telemedicine application and verified that I am speaking with the correct person using two identifiers.   Staff discussed the limitations of evaluation and management by telemedicine and the availability of in person appointments. The patient expressed understanding and agreed to proceed.  Patient location: home My location: work office Other people present: Nurse with [employer] [place], Cletis Athens   HPI  Patient note numerous issues she would like to discuss.   Patient states she has had body aches, chest congestion and rib pain ongoing for 3 weeks. States she is unable to hold her breath enough to take her inhaler- anoro and xopenex. States she has fatigue, and difficulty taking a deep breath. Endorses sinus pain worse on the left has been ongoing for 2 weeks. Using flonase with minimal relief of symptoms. Getting out lots of thick mucous.  Denies fevers, chills.  States she would like a new pulmonologist to address her COPD, would also like respiratory therapy. Appears Dr. Sanda Klein has ordered this earlier this month, states she has not gotten a phone call.   Patient would like a prescription for a ramp, states has trouble getting into her house with steps. Will be having home health PT evaluate. Has been unable to reach her orthopedic specialist to discuss her foot MRIs  PHQ2/9: Depression screen [facility] 2/9 05/14/2018 04/12/2018 02/26/2018 02/08/2018 01/28/2018  Decreased Interest 3 2 2  0 0  Down, Depressed, Hopeless 3 2 1 1 1   PHQ - 2 Score 6 4 3 1 1   Altered sleeping 3 2 3 3 3   Tired, decreased energy 3 2 3 3 2   Change in appetite 1 0 3 3 2   Feeling bad or failure about yourself  3 0 1 1 0  Trouble concentrating 1 0 3 3 1   Moving slowly or fidgety/restless 0 1 0 0 0  Suicidal thoughts 0 0 0 0 0  PHQ-9 Score 17 9 16 14 9   Difficult doing work/chores Extremely dIfficult Very difficult Extremely  dIfficult - -  Some recent data might be hidden     PHQ reviewed. Positive - did not want to discuss this at this visit had other issues she would like to discuss.   Patient Active Problem List   Diagnosis Date Noted  . Achilles tendon disorder 04/25/2018  . Insomnia 04/25/2018  . Diastolic dysfunction 38/75/6433  . Mitral regurgitation 03/29/2018  . COPD (chronic obstructive pulmonary disease) ([place]) 03/15/2018  . Bronchospasm 03/15/2018  . Axis II diagnosis deferred 02/20/2018  . Preventative health care 02/08/2018  . Polypharmacy 02/01/2018  . Personality disorder ([place]) 12/25/2017  . Noncompliance 12/25/2017  . Medication monitoring encounter 12/07/2017  . Family history of high cholesterol 12/07/2017  . Arthritis of hand, degenerative 12/07/2017  . Bipolar I disorder, most recent episode mixed, severe with psychotic features ([place]) 09/18/2017  . Agitation 09/18/2017  . Depressed mood 07/11/2017  . Hallux valgus, acquired 07/05/2017  . Hammer toes of both feet 07/05/2017  . Unstable ankle 07/05/2017  . Chronic bilateral low back pain without sciatica 06/09/2017  . Tinea unguium 05/08/2017  . Unresolved grief 05/08/2017  . Conductive hearing loss of right ear with unrestricted hearing of left ear 04/03/2017  . Adjustment disorder with depressed mood 01/18/2017  . Migraine 01/18/2017  . Postmenopausal bleeding 01/18/2017  . IBS (irritable bowel syndrome) 11/17/2016  . Bilateral carpal tunnel syndrome 11/17/2016  . Plantar fasciitis,  bilateral 11/17/2016  . Cervical myofascial pain syndrome 11/17/2016  . Chronic hyponatremia 11/17/2016  . PTSD (post-traumatic stress disorder) 11/17/2016  . Hernia, hiatal 11/17/2016  . Vitamin D deficiency 11/17/2016  . Vitamin B12 deficiency 11/17/2016  . SIADH (syndrome of inappropriate ADH production) ([place]) 09/08/2016  . Cervical spondylosis with myelopathy 05/24/2016  . Post-concussion headache 05/24/2016  . MCI (mild cognitive  impairment) with memory loss 03/30/2016  . Gait abnormality 03/30/2016  . Chronic bipolar affective disorder ([place]) 01/24/2016  . Recurrent major depressive disorder, in partial remission ([place]) 01/24/2016  . Anemia, iron deficiency 06/24/2015  . Endometriosis 06/24/2015  . Essential hypertension 06/24/2015  . History of falling 05/22/2015  . History of TIA (transient ischemic attack) 04/20/2015  . Hypothyroidism 04/20/2015  . GERD (gastroesophageal reflux disease) 04/20/2015  . Cervical disc disorder at C5-C6 level with radiculopathy 03/25/2015  . Chronic neck pain 03/25/2015  . Pelvic pain in female 10/30/2014  . History of skin cancer 06/30/2014  . Chronic sinusitis 06/30/2014  . Chronic headaches 12/16/2013  . Neuropathy 12/16/2013    Past Medical History:  Diagnosis Date  . Allergic rhinitis   . Anemia   . Blurred vision   . Depression   . Diverticulosis   . Endometriosis   . Falls   . GERD (gastroesophageal reflux disease)   . Hernia, hiatal   . Hypothyroidism   . IBS (irritable bowel syndrome)   . Malignant neoplasm of skin   . Migraine   . Neuropathy   . PTSD (post-traumatic stress disorder)   . Thyroid disease     Past Surgical History:  Procedure Laterality Date  . APPENDECTOMY    . CHOLECYSTECTOMY    . CHOLECYSTECTOMY, LAPAROSCOPIC    . ESOPHAGOGASTRODUODENOSCOPY (EGD) WITH PROPOFOL N/A 03/29/2015   Procedure: ESOPHAGOGASTRODUODENOSCOPY (EGD) WITH PROPOFOL;  Surgeon: Josefine Class, MD;  Location: [facility] ENDOSCOPY;  Service: Endoscopy;  Laterality: N/A;  . HERNIA REPAIR    . laproscopy    . TONSILLECTOMY    . uteral suspension      Social History   Tobacco Use  . Smoking status: Former Smoker    Last attempt to quit: 11/17/1976    Years since quitting: 41.5  . Smokeless tobacco: Never Used  Substance Use Topics  . Alcohol use: No     Current Outpatient Medications:  .  acetaminophen (TYLENOL) 650 MG CR tablet, Take 1 tablet (650 mg total) by  mouth every 8 (eight) hours as needed for pain., Disp: 90 tablet, Rfl: 0 .  albuterol (PROVENTIL HFA;VENTOLIN HFA) 108 (90 Base) MCG/ACT inhaler, Inhale 2 puffs into the lungs every 4 (four) hours as needed for wheezing or shortness of breath., Disp: 1 Inhaler, Rfl: 1 .  Amino Acids-Protein Hydrolys (FEEDING SUPPLEMENT, PRO-STAT 64,) LIQD, Take 30 mLs by mouth daily., Disp: 887 mL, Rfl: 0 .  amLODipine (NORVASC) 5 MG tablet, Take 1 tablet (5 mg total) by mouth daily., Disp: 90 tablet, Rfl: 0 .  atorvastatin (LIPITOR) 20 MG tablet, Take 1 tablet (20 mg total) by mouth at bedtime., Disp: 90 tablet, Rfl: 0 .  Biotin 10000 MCG TABS, Take by mouth., Disp: , Rfl:  .  calcium carbonate (TUMS - DOSED IN MG ELEMENTAL CALCIUM) 500 MG chewable tablet, Chew 1 tablet by mouth daily., Disp: , Rfl:  .  Cholecalciferol 25 MCG (1000 UT) tablet, Take 1 tablet (1,000 Units total) by mouth daily., Disp: 30 tablet, Rfl: 1 .  Cyanocobalamin (VITAMIN B-12) 1000 MCG SUBL, Place under  the tongue., Disp: , Rfl:  .  cycloSPORINE (RESTASIS) 0.05 % ophthalmic emulsion, Place 1 drop into both eyes 2 (two) times daily., Disp: 0.4 mL, Rfl: 1 .  diclofenac sodium (VOLTAREN) 1 % GEL, Apply small amount (2 grams) topically over knees or hands up to four times a day if needed for arthritis, Disp: 100 g, Rfl: 0 .  dicyclomine (BENTYL) 10 MG capsule, Take 10 mg by mouth as needed for spasms., Disp: , Rfl:  .  famotidine (PEPCID) 20 MG tablet, Take 1 tablet (20 mg total) by mouth 2 (two) times daily as needed for heartburn or indigestion., Disp: 180 tablet, Rfl: 0 .  ferrous sulfate 325 (65 FE) MG tablet, Take 325 mg by mouth daily with breakfast., Disp: , Rfl:  .  gabapentin (NEURONTIN) 100 MG capsule, Take 100 mg by mouth 2 (two) times daily., Disp: , Rfl:  .  Glucosamine-Chondroitin (GLUCOSAMINE CHONDR COMPLEX PO), Take by mouth., Disp: , Rfl:  .  guaiFENesin (MUCINEX) 600 MG 12 hr tablet, Take by mouth 2 (two) times daily., Disp: ,  Rfl:  .  hydrOXYzine (ATARAX/VISTARIL) 10 MG tablet, TAKE 1 TO 2 TABLETS BY MOUTH ONCE DAILY AS NEEDED, Disp: , Rfl:  .  Icosapent Ethyl (VASCEPA) 1 g CAPS, Two capsules by mouth twice a day (to help with triglycerides), Disp: 360 capsule, Rfl: 0 .  ipratropium (ATROVENT) 0.03 % nasal spray, Place 2 sprays into both nostrils every 12 (twelve) hours., Disp: , Rfl:  .  levalbuterol (XOPENEX) 0.63 MG/3ML nebulizer solution, Take 3 mLs (0.63 mg total) by nebulization every 4 (four) hours as needed for wheezing or shortness of breath., Disp: 200 mL, Rfl: 0 .  levalbuterol (XOPENEX) 1.25 MG/3ML nebulizer solution, , Disp: , Rfl:  .  levothyroxine (SYNTHROID, LEVOTHROID) 100 MCG tablet, Take 1 tablet (100 mcg total) by mouth daily at 6 (six) AM., Disp: 90 tablet, Rfl: 0 .  linaclotide (LINZESS) 290 MCG CAPS capsule, Take 1 capsule (290 mcg total) by mouth daily before breakfast., Disp: 30 capsule, Rfl: 0 .  LORazepam (ATIVAN) 0.5 MG tablet, TAKE 1 TABLET BY MOUTH AS NEEDED ONCE DAILY, Disp: , Rfl:  .  magnesium oxide (MAG-OX) 400 (241.3 Mg) MG tablet, Take 0.5 tablets (200 mg total) by mouth 2 (two) times daily. (Patient taking differently: Take 500 mg by mouth daily. ), Disp: 60 tablet, Rfl: 1 .  Multiple Vitamin (MULTIVITAMIN WITH MINERALS) TABS tablet, Take 1 tablet by mouth daily., Disp: 30 tablet, Rfl: 1 .  PARoxetine (PAXIL-CR) 25 MG 24 hr tablet, Take 1 tablet (25 mg total) by mouth daily., Disp: 30 tablet, Rfl: 0 .  polyvinyl alcohol (LIQUIFILM TEARS) 1.4 % ophthalmic solution, Place 1 drop into both eyes as needed for dry eyes., Disp: 15 mL, Rfl: 0 .  PREMARIN vaginal cream, , Disp: , Rfl:  .  promethazine (PHENERGAN) 12.5 MG tablet, Take 12.5 mg by mouth as needed. , Disp: , Rfl:  .  pyridoxine (B-6) 100 MG tablet, Take 1 tablet (100 mg total) by mouth 2 (two) times daily., Disp: 60 tablet, Rfl: 0 .  simethicone (MYLICON) 80 MG chewable tablet, Chew by mouth., Disp: , Rfl:  .  tiZANidine  (ZANAFLEX) 2 MG tablet, Take by mouth every 6 (six) hours as needed for muscle spasms., Disp: , Rfl:  .  traZODone (DESYREL) 50 MG tablet, 50 mg as needed. Pt takes 1/2 tablet qhs, Disp: , Rfl:  .  UNABLE TO FIND, Recliner with elevated leg rest,  automatic and raises off of the floor to assist with standing, Disp: 1 each, Rfl: 0 .  doxycycline (VIBRA-TABS) 100 MG tablet, Take 1 tablet (100 mg total) by mouth 2 (two) times daily., Disp: 20 tablet, Rfl: 0 .  fluconazole (DIFLUCAN) 150 MG tablet, Take 1 tablet (150 mg total) by mouth once for 1 dose., Disp: 1 tablet, Rfl: 0  Allergies  Allergen Reactions  . Advair Diskus [Fluticasone-Salmeterol] Shortness Of Breath  . Bee Venom Hives  . Darvon [Propoxyphene] Anaphylaxis and Hives    Hives/throat swelling   . Dicyclomine Itching  . Other Hives    Spider bites cause hives  . Oxycodone-Acetaminophen Nausea And Vomiting and Other (See Comments)    hallucinations   . Peanuts [Peanut Oil] Anaphylaxis and Hives  . Penicillins Hives    Has patient had a PCN reaction causing immediate rash, facial/tongue/throat swelling, SOB or lightheadedness with hypotension: Unknown Has patient had a PCN reaction causing severe rash involving mucus membranes or skin necrosis: Unknown Has patient had a PCN reaction that required hospitalization: Unknown Has patient had a PCN reaction occurring within the last 10 years: Unknown If all of the above answers are "NO", then may proceed with Cephalosporin use.  . Sulfa Antibiotics Anaphylaxis, Hives and Itching  . Sulfacetamide Sodium Anaphylaxis, Hives and Itching  . Ultram [Tramadol] Hives  . Amikacin Nausea And Vomiting  . Aspirin Hives  . Doxycycline Photosensitivity  . Haloperidol Nausea And Vomiting  . Hymenoptera Venom Preparations Hives    Reaction to spider bites  . Imipramine Hives    Reaction to Tofranil  . Keflex [Cephalexin] Hives  . Lactose Intolerance (Gi)   . Risperdal [Risperidone]      Serotonin syndrome  . Sulfur Hives  . Adhesive [Tape] Hives and Rash  . Albuterol Palpitations  . Ciprofloxacin Other (See Comments)    tendonopathy  . Hydroxyzine Anxiety and Itching  . Peanut Butter Flavor Anxiety  . Prednisone Nausea And Vomiting, Anxiety and Other (See Comments)    Crazy mood swings, strange thoughts (pt does tolerate cortisone injections)    ROS   No other specific complaints in a complete review of systems (except as listed in HPI above).  Objective  Vitals:   05/14/18 1416  BP: 111/86  Pulse: 75  Resp: 18  Temp: (!) 97.1 F (36.2 C)  TempSrc: Oral  SpO2: 97%    There is no height or weight on file to calculate BMI.  Nursing Note and Vital Signs reviewed.  Physical Exam   Constitutional: Patient appears well-developed and well-nourished. No distress.  HENT: Head: Normocephalic and atraumatic. Cardiovascular: Normal rate Pulmonary/Chest: Effort normal  Musculoskeletal: Normal range of motion,  Neurological: is alert and oriented to person, place, and time. speech is normal.  Skin: No rash noted. No erythema.  Psychiatric: Patient is agitated but able to clearly articulate thoughts     Assessment & Plan 1. Acute non-recurrent pansinusitis Flonase, antihistamine  - doxycycline (VIBRA-TABS) 100 MG tablet; Take 1 tablet (100 mg total) by mouth 2 (two) times daily.  Dispense: 20 tablet; Refill: 0  2. Chronic obstructive pulmonary disease, unspecified COPD type ([place]) Able to speak full sentences without any issue on the phone- low suspicion for exacerbation, referral placed for new pulmonologist per patient request.  - Ambulatory referral to Pulmonology  3. Antibiotic-induced yeast infection - fluconazole (DIFLUCAN) 150 MG tablet; Take 1 tablet (150 mg total) by mouth once for 1 dose.  Dispense: 1 tablet; Refill: 0  4. Dry eyes - cycloSPORINE (RESTASIS) 0.05 % ophthalmic emulsion; Place 1 drop into both eyes 2 (two) times daily.  Dispense:  0.4 mL; Refill: 1  5. Unsteady gait Has home PT to evaluate later this week, will consider ramp based of their report and recommendations.   6. Plantar fasciitis, bilateral Discuss with specialist     Follow Up Instructions:   3 months  I discussed the assessment and treatment plan with the patient. The patient was provided an opportunity to ask questions and all were answered. The patient agreed with the plan and demonstrated an understanding of the instructions.   The patient was advised to call back or seek an in-person evaluation if the symptoms worsen or if the condition fails to improve as anticipated.  I provided 46 minutes of non-face-to-face time during this encounter.   Fredderick Severance, NP

## 2018-05-15 ENCOUNTER — Telehealth: Payer: Self-pay | Admitting: Nurse Practitioner

## 2018-05-15 NOTE — Telephone Encounter (Signed)
Referral coordinator- denise reached out to various agencies for respiratory PT ordered by Dr. Sanda Klein. Patient cannot get PT from 2 different agencies if her present agency cannot provide teaching for chest percussion therapy patient will be need to discuss this with her new pulmonologist. D Additionally we want to limit the contact with people in her home during this pandemic.

## 2018-05-16 DIAGNOSIS — I051 Rheumatic mitral insufficiency: Secondary | ICD-10-CM | POA: Diagnosis not present

## 2018-05-16 DIAGNOSIS — J441 Chronic obstructive pulmonary disease with (acute) exacerbation: Secondary | ICD-10-CM | POA: Diagnosis not present

## 2018-05-16 DIAGNOSIS — M722 Plantar fascial fibromatosis: Secondary | ICD-10-CM | POA: Diagnosis not present

## 2018-05-16 DIAGNOSIS — M19049 Primary osteoarthritis, unspecified hand: Secondary | ICD-10-CM | POA: Diagnosis not present

## 2018-05-16 DIAGNOSIS — M791 Myalgia, unspecified site: Secondary | ICD-10-CM | POA: Diagnosis not present

## 2018-05-16 DIAGNOSIS — M50122 Cervical disc disorder at C5-C6 level with radiculopathy: Secondary | ICD-10-CM | POA: Diagnosis not present

## 2018-05-16 DIAGNOSIS — M4712 Other spondylosis with myelopathy, cervical region: Secondary | ICD-10-CM | POA: Diagnosis not present

## 2018-05-16 DIAGNOSIS — G5603 Carpal tunnel syndrome, bilateral upper limbs: Secondary | ICD-10-CM | POA: Diagnosis not present

## 2018-05-16 DIAGNOSIS — I10 Essential (primary) hypertension: Secondary | ICD-10-CM | POA: Diagnosis not present

## 2018-05-16 DIAGNOSIS — G43911 Migraine, unspecified, intractable, with status migrainosus: Secondary | ICD-10-CM | POA: Diagnosis not present

## 2018-05-16 DIAGNOSIS — G629 Polyneuropathy, unspecified: Secondary | ICD-10-CM | POA: Diagnosis not present

## 2018-05-20 ENCOUNTER — Telehealth: Payer: Self-pay | Admitting: Family Medicine

## 2018-05-20 NOTE — Telephone Encounter (Signed)
Please approve home health orders, and approve walker order as well.

## 2018-05-20 NOTE — Telephone Encounter (Signed)
Left voicemail of verbal order

## 2018-05-20 NOTE — Telephone Encounter (Signed)
Copied from [place] [id number]. Topic: Quick Communication - Home Health Verbal Orders >> May 20, 2018  8:39 AM Vernona Rieger wrote: Caller/Agency: Aldran with [facility] Number: [phone] Requesting OT/PT/Skilled Nursing/Social Work/Speech Therapy: physical therapy Frequency: 2 week 3  He also stated she has broken her walker and will need a new 4-wheeled walker with a seat. He said once he gets the orders for physical therapy, he will then fax over a form for this to be processed and he will order it.

## 2018-05-21 ENCOUNTER — Other Ambulatory Visit: Payer: Self-pay

## 2018-05-22 ENCOUNTER — Telehealth: Payer: Self-pay

## 2018-05-22 NOTE — Telephone Encounter (Signed)
Called patient.  No answer. VM was full.  Need to change appointment to an Evisit.

## 2018-05-24 ENCOUNTER — Telehealth: Payer: Self-pay | Admitting: Family Medicine

## 2018-05-24 NOTE — Telephone Encounter (Signed)
Copied from [place] [id number]. Topic: Quick Communication - Home Health Verbal Orders >> May 24, 2018 12:03 PM Rutherford Nail, [place] wrote: Caller/Agency: [facility] Number: [phone] Requesting OT/PT/Skilled Nursing/Social Work/Speech Therapy: Occupational Therapy Frequency:  States that patient refused visit today, so it is a missed visit this week Would like to move reassessment visit to next week.

## 2018-05-25 ENCOUNTER — Other Ambulatory Visit: Payer: Self-pay | Admitting: Nurse Practitioner

## 2018-05-25 DIAGNOSIS — E781 Pure hyperglyceridemia: Secondary | ICD-10-CM

## 2018-05-27 NOTE — Telephone Encounter (Signed)
Home health agency is calling in to follow up on request.   Please advise.   [phone]

## 2018-05-28 ENCOUNTER — Telehealth: Payer: Self-pay | Admitting: Family Medicine

## 2018-05-28 ENCOUNTER — Telehealth: Payer: Self-pay

## 2018-05-28 NOTE — Telephone Encounter (Signed)
Please call home health and approve Social Work/Speech, may do initial visit tomorrow.

## 2018-05-28 NOTE — Telephone Encounter (Signed)
Almyra Free calling back would like to change frequency skilled 1x3 home health aid 2x3  [phone]

## 2018-05-28 NOTE — Telephone Encounter (Signed)
Copied from [place] [id number]. Topic: Quick Communication - Home Health Verbal Orders >> May 28, 2018  2:10 PM Margot Ables wrote: Caller/Agency: Almyra Free RN with [facility] Callback Number: [phone] Requesting OT/PT/Skilled Nursing/Social Work/Speech Therapy: SN & HH Aide Frequency: needing VO to decrease SN to 1x week 4 weeks & decrease HH Aide to 2x week 4 weeks

## 2018-05-28 NOTE — Telephone Encounter (Signed)
Kim - please follow up with Home Health to find out if they need any additional orders from [place].  Otherwise, patient's missed visit is noted.

## 2018-05-28 NOTE — Telephone Encounter (Signed)
Left detailed VM providing verbal orders given per Raelyn Ensign, NP.

## 2018-05-28 NOTE — Telephone Encounter (Signed)
Copied from [place] [id number]. Topic: Quick Communication - Home Health Verbal Orders >> May 27, 2018 11:03 AM Loma [place] wrote: Caller/Agency:Home Health Callback Number:[phone] Linard Millers with home health Requesting Social Work/Speech Pt requesting Social work, Home health was wanting to do initial visit tomorrow is possible. Please FU with Estill Bamberg at [phone] >> May 27, 2018 11:58 AM Percell Belt A wrote: Pt called in requesting to talk to [facility] about home health orders.  Pt refused to give me any other info.  She was upset that the verbals were not called in to home health agency

## 2018-05-29 ENCOUNTER — Other Ambulatory Visit: Payer: Self-pay

## 2018-05-29 ENCOUNTER — Telehealth: Payer: Self-pay | Admitting: Cardiovascular Disease

## 2018-05-29 ENCOUNTER — Telehealth: Payer: Self-pay | Admitting: Family Medicine

## 2018-05-29 NOTE — Telephone Encounter (Signed)
Copied from [place] [id number]. Topic: Quick Communication - Home Health Verbal Orders >> May 29, 2018  9:50 AM Scherrie Gerlach wrote: Caller/Agency: Jeneen Rinks //[facility] Callback Number: [phone] Calling to report a missed visit with the pt.  Just a miss understanding about the day Pt rescheduled tomorrow, thurs 5/14

## 2018-05-29 NOTE — Telephone Encounter (Signed)
Virtual Visit Pre-Appointment Phone Call  "(Name), I am calling you today to discuss your upcoming appointment. We are currently trying to limit exposure to the virus that causes COVID-19 by seeing patients at home rather than in the office."  1. "What is the BEST phone number to call the day of the visit?" - include this in appointment notes  2. Do you have or have access to (through a family member/friend) a smartphone with video capability that we can use for your visit?" a. If yes - list this number in appt notes as cell (if different from BEST phone #) and list the appointment type as a VIDEO visit in appointment notes b. If no - list the appointment type as a PHONE visit in appointment notes  3. Confirm consent - "In the setting of the current Covid19 crisis, you are scheduled for a (phone or video) visit with your provider on (date) at (time).  Just as we do with many in-office visits, in order for you to participate in this visit, we must obtain consent.  If you'd like, I can send this to your mychart (if signed up) or email for you to review.  Otherwise, I can obtain your verbal consent now.  All virtual visits are billed to your insurance company just like a normal visit would be.  By agreeing to a virtual visit, we'd like you to understand that the technology does not allow for your provider to perform an examination, and thus may limit your provider's ability to fully assess your condition. If your provider identifies any concerns that need to be evaluated in person, we will make arrangements to do so.  Finally, though the technology is pretty good, we cannot assure that it will always work on either your or our end, and in the setting of a video visit, we may have to convert it to a phone-only visit.  In either situation, we cannot ensure that we have a secure connection.  Are you willing to proceed?" STAFF: Did the patient verbally acknowledge consent to telehealth visit? Document  YES/NO here: YES  4. Advise patient to be prepared - "Two hours prior to your appointment, go ahead and check your blood pressure, pulse, oxygen saturation, and your weight (if you have the equipment to check those) and write them all down. When your visit starts, your provider will ask you for this information. If you have an Apple Watch or Kardia device, please plan to have heart rate information ready on the day of your appointment. Please have a pen and paper handy nearby the day of the visit as well."  5. Give patient instructions for MyChart download to smartphone OR Doximity/Doxy.me as below if video visit (depending on what platform provider is using)  6. Inform patient they will receive a phone call 15 minutes prior to their appointment time (may be from unknown caller ID) so they should be prepared to answer    TELEPHONE CALL NOTE  Angela [name] has been deemed a candidate for a follow-up tele-health visit to limit community exposure during the Covid-19 pandemic. I spoke with the patient via phone to ensure availability of phone/video source, confirm preferred email & phone number, and discuss instructions and expectations.  I reminded Angela [name] to be prepared with any vital sign and/or heart rhythm information that could potentially be obtained via home monitoring, at the time of her visit. I reminded Angela [name] to expect a phone call prior to  her visit.  Angela [name] 05/29/2018 12:00 PM   INSTRUCTIONS FOR DOWNLOADING THE MYCHART APP TO SMARTPHONE  - The patient must first make sure to have activated MyChart and know their login information - If Apple, go to [employer] and type in MyChart in the search bar and download the app. If Android, ask patient to go to [employer] and type in [place] in the search bar and download the app. The app is free but as with any other app downloads, their phone may require them to verify saved payment information  or Apple/Android password.  - The patient will need to then log into the app with their MyChart username and password, and select [facility] as their healthcare provider to link the account. When it is time for your visit, go to the MyChart app, find appointments, and click Begin Video Visit. Be sure to Select Allow for your device to access the Microphone and Camera for your visit. You will then be connected, and your provider will be with you shortly.  **If they have any issues connecting, or need assistance please contact MyChart service desk [phone])**  **If using a computer, in order to ensure the best quality for their visit they will need to use either of the following Internet Browsers: [employer], or Google Chrome**  IF USING DOXIMITY or DOXY.ME - The patient will receive a link just prior to their visit by text.     FULL LENGTH CONSENT FOR TELE-HEALTH VISIT   I hereby voluntarily request, consent and authorize [facility] and its employed or contracted physicians, physician assistants, nurse practitioners or other licensed health care professionals (the Practitioner), to provide me with telemedicine health care services (the Services") as deemed necessary by the treating Practitioner. I acknowledge and consent to receive the Services by the Practitioner via telemedicine. I understand that the telemedicine visit will involve communicating with the Practitioner through live audiovisual communication technology and the disclosure of certain medical information by electronic transmission. I acknowledge that I have been given the opportunity to request an in-person assessment or other available alternative prior to the telemedicine visit and am voluntarily participating in the telemedicine visit.  I understand that I have the right to withhold or withdraw my consent to the use of telemedicine in the course of my care at any time, without affecting my right to future  care or treatment, and that the Practitioner or I may terminate the telemedicine visit at any time. I understand that I have the right to inspect all information obtained and/or recorded in the course of the telemedicine visit and may receive copies of available information for a reasonable fee.  I understand that some of the potential risks of receiving the Services via telemedicine include:   Delay or interruption in medical evaluation due to technological equipment failure or disruption;  Information transmitted may not be sufficient (e.g. poor resolution of images) to allow for appropriate medical decision making by the Practitioner; and/or   In rare instances, security protocols could fail, causing a breach of personal health information.  Furthermore, I acknowledge that it is my responsibility to provide information about my medical history, conditions and care that is complete and accurate to the best of my ability. I acknowledge that Practitioner's advice, recommendations, and/or decision may be based on factors not within their control, such as incomplete or inaccurate data provided by me or distortions of diagnostic images or specimens that may result from electronic transmissions. I understand  that the practice of medicine is not an exact science and that Practitioner makes no warranties or guarantees regarding treatment outcomes. I acknowledge that I will receive a copy of this consent concurrently upon execution via email to the email address I last provided but may also request a printed copy by calling the office of [facility].    I understand that my insurance will be billed for this visit.   I have read or had this consent read to me.  I understand the contents of this consent, which adequately explains the benefits and risks of the Services being provided via telemedicine.   I have been provided ample opportunity to ask questions regarding this consent and the Services and have  had my questions answered to my satisfaction.  I give my informed consent for the services to be provided through the use of telemedicine in my medical care  By participating in this telemedicine visit I agree to the above.

## 2018-05-29 NOTE — Telephone Encounter (Signed)
Reviewed

## 2018-05-29 NOTE — Telephone Encounter (Signed)
Attempted to reach out to [place] with [facility] personally as we have received numerous calls over the last few days regarding this patient - no answer, LVM at 10:22am on 05/29/2018.

## 2018-05-30 ENCOUNTER — Telehealth: Payer: Self-pay

## 2018-05-30 NOTE — Telephone Encounter (Signed)
Accepted. Verbal order provided.

## 2018-05-30 NOTE — Progress Notes (Signed)
Virtual Visit via Video Note   This visit type was conducted due to national recommendations for restrictions regarding the COVID-19 Pandemic (e.g. social distancing) in an effort to limit this patient's exposure and mitigate transmission in our community.  Due to her co-morbid illnesses, this patient is at least at moderate risk for complications without adequate follow up.  This format is felt to be most appropriate for this patient at this time.  All issues noted in this document were discussed and addressed.  A limited physical exam was performed with this format.  Please refer to the patient's chart for her consent to telehealth for [facility].   I connected with  Angela [name] on 05/31/18 by a video enabled telemedicine application and verified that I am speaking with the correct person using two identifiers. I discussed the limitations of evaluation and management by telemedicine. The patient expressed understanding and agreed to proceed.   Evaluation Performed:  Follow-up visit  Date:  05/31/2018   ID:  Angela [name], DOB 1950/06/10, MRN [id number]  Patient Location:  [address] [place] [address]   Provider location:   [facility], [place] office  PCP:  Arnetha Courser, MD  Cardiologist:  Arvid Right [facility]   Chief Complaint:  Chest pain, hiatal hernia  New patient  History of Present Illness:    Angela [name] is a 68 y.o. female who presents via audio/video conferencing for a telehealth visit today.   The patient does not symptoms concerning for COVID-19 infection (fever, chills, cough, or new SHORTNESS OF BREATH).   Patient has a past medical history of COPD Leg edema Bipolar;/personality disorder/Depression/ PTSD seen by psychiatry Former smoker, stopped in her 34s large hiatal hernia Referred by Dr.Lada for chest pain  Referral initially placed in January 2020  Seen in the emergency room February 2020 for  shortness of breath And left-sided chest pain Weakness Negative work-up  Some SOB Scoliosis, She attributes much of her chest fullness chest pain to her hiatal hernia  CT scan images pulled up and reviewed with her in detail minimal aortic athero in descending aorta, No carotid or coronary calcium  She reports otherwise she is active and able to exercise without any chest pain   Has a hiatal hernia, large Seen on CT scan   Prior CV studies:   The following studies were reviewed today: Echo 2017 - Left ventricle: The cavity size was normal. Wall thickness was   normal. Systolic function was normal. The estimated ejection   fraction was in the range of 60% to 65%. Wall motion was normal;   there were no regional wall motion abnormalities. Doppler   parameters are consistent with abnormal left ventricular   relaxation (grade 1 diastolic dysfunction). - Mitral valve: There was mild regurgitation. - Right atrium: The right atrium appears to be compressed by an   outside structure likely the known hiatal hernia.   Past Medical History:  Diagnosis Date  . Allergic rhinitis   . Anemia   . Blurred vision   . Depression   . Diverticulosis   . Endometriosis   . Falls   . GERD (gastroesophageal reflux disease)   . Hernia, hiatal   . Hypothyroidism   . IBS (irritable bowel syndrome)   . Malignant neoplasm of skin   . Migraine   . Neuropathy   . PTSD (post-traumatic stress disorder)   . Thyroid disease    Past Surgical  History:  Procedure Laterality Date  . APPENDECTOMY    . CHOLECYSTECTOMY    . CHOLECYSTECTOMY, LAPAROSCOPIC    . ESOPHAGOGASTRODUODENOSCOPY (EGD) WITH PROPOFOL N/A 03/29/2015   Procedure: ESOPHAGOGASTRODUODENOSCOPY (EGD) WITH PROPOFOL;  Surgeon: Josefine Class, MD;  Location: [facility] ENDOSCOPY;  Service: Endoscopy;  Laterality: N/A;  . HERNIA REPAIR    . laproscopy    . TONSILLECTOMY    . uteral suspension       Current Meds  Medication Sig  .  acetaminophen (TYLENOL) 650 MG CR tablet Take 1 tablet (650 mg total) by mouth every 8 (eight) hours as needed for pain.  Marland Kitchen albuterol (PROVENTIL HFA;VENTOLIN HFA) 108 (90 Base) MCG/ACT inhaler Inhale 2 puffs into the lungs every 4 (four) hours as needed for wheezing or shortness of breath.  . Amino Acids-Protein Hydrolys (FEEDING SUPPLEMENT, PRO-STAT 64,) LIQD Take 30 mLs by mouth daily.  Marland Kitchen amLODipine (NORVASC) 5 MG tablet Take 1 tablet (5 mg total) by mouth daily.  Marland Kitchen atorvastatin (LIPITOR) 20 MG tablet Take 1 tablet (20 mg total) by mouth at bedtime.  . Biotin 10000 MCG TABS Take by mouth.  . calcium carbonate (TUMS - DOSED IN MG ELEMENTAL CALCIUM) 500 MG chewable tablet Chew 1 tablet by mouth daily.  . Cholecalciferol 25 MCG (1000 UT) tablet Take 1 tablet (1,000 Units total) by mouth daily.  . Cyanocobalamin (VITAMIN B-12) 1000 MCG SUBL Place under the tongue.  . cycloSPORINE (RESTASIS) 0.05 % ophthalmic emulsion Place 1 drop into both eyes 2 (two) times daily.  . diclofenac sodium (VOLTAREN) 1 % GEL Apply small amount (2 grams) topically over knees or hands up to four times a day if needed for arthritis  . famotidine (PEPCID) 20 MG tablet Take 1 tablet (20 mg total) by mouth 2 (two) times daily as needed for heartburn or indigestion.  . ferrous sulfate 325 (65 FE) MG tablet Take 325 mg by mouth daily with breakfast.  . gabapentin (NEURONTIN) 100 MG capsule Take 100 mg by mouth 2 (two) times daily.  . Glucosamine-Chondroitin (GLUCOSAMINE CHONDR COMPLEX PO) Take by mouth.  . hydrOXYzine (ATARAX/VISTARIL) 10 MG tablet TAKE 1 TO 2 TABLETS BY MOUTH ONCE DAILY AS NEEDED  . ipratropium (ATROVENT) 0.03 % nasal spray Place 2 sprays into both nostrils every 12 (twelve) hours.  Marland Kitchen levalbuterol (XOPENEX) 0.63 MG/3ML nebulizer solution Take 3 mLs (0.63 mg total) by nebulization every 4 (four) hours as needed for wheezing or shortness of breath.  . levalbuterol (XOPENEX) 1.25 MG/3ML nebulizer solution   .  levothyroxine (SYNTHROID, LEVOTHROID) 100 MCG tablet Take 1 tablet (100 mcg total) by mouth daily at 6 (six) AM.  . linaclotide (LINZESS) 290 MCG CAPS capsule Take 1 capsule (290 mcg total) by mouth daily before breakfast.  . LORazepam (ATIVAN) 0.5 MG tablet TAKE 1 TABLET BY MOUTH AS NEEDED ONCE DAILY  . magnesium oxide (MAG-OX) 400 (241.3 Mg) MG tablet Take 0.5 tablets (200 mg total) by mouth 2 (two) times daily. (Patient taking differently: Take 500 mg by mouth daily. )  . Multiple Vitamin (MULTIVITAMIN WITH MINERALS) TABS tablet Take 1 tablet by mouth daily.  Marland Kitchen PARoxetine (PAXIL-CR) 25 MG 24 hr tablet Take 1 tablet (25 mg total) by mouth daily.  . polyvinyl alcohol (LIQUIFILM TEARS) 1.4 % ophthalmic solution Place 1 drop into both eyes as needed for dry eyes.  Marland Kitchen PREMARIN vaginal cream   . promethazine (PHENERGAN) 12.5 MG tablet Take 12.5 mg by mouth as needed.   . pyridoxine (B-6)  100 MG tablet Take 1 tablet (100 mg total) by mouth 2 (two) times daily.  . simethicone (MYLICON) 80 MG chewable tablet Chew by mouth.  Marland Kitchen tiZANidine (ZANAFLEX) 2 MG tablet Take by mouth every 6 (six) hours as needed for muscle spasms.  . traZODone (DESYREL) 50 MG tablet 50 mg as needed. Pt takes 1/2 tablet qhs  . UNABLE TO FIND Recliner with elevated leg rest, automatic and raises off of the floor to assist with standing  . VASCEPA 1 g CAPS TAKE 2 CAPSULES BY MOUTH TWICE DAILY (TO HELP WITH TRIGLYCERIDES)     Allergies:   Advair diskus [fluticasone-salmeterol]; Bee venom; Darvon [propoxyphene]; Dicyclomine; Other; Oxycodone-acetaminophen; Peanuts [peanut oil]; Penicillins; Sulfa antibiotics; Sulfacetamide sodium; Ultram [tramadol]; Amikacin; Aspirin; Doxycycline; Haloperidol; Hymenoptera venom preparations; Imipramine; Keflex [cephalexin]; Lactose intolerance (gi); Risperdal [risperidone]; Sulfur; Adhesive [tape]; Albuterol; Ciprofloxacin; Hydroxyzine; Peanut butter flavor; and Prednisone   Social History   Tobacco  Use  . Smoking status: Former Smoker    Last attempt to quit: 11/17/1976    Years since quitting: 41.5  . Smokeless tobacco: Never Used  Substance Use Topics  . Alcohol use: No  . Drug use: No     Current Outpatient Medications on File Prior to Visit  Medication Sig Dispense Refill  . acetaminophen (TYLENOL) 650 MG CR tablet Take 1 tablet (650 mg total) by mouth every 8 (eight) hours as needed for pain. 90 tablet 0  . albuterol (PROVENTIL HFA;VENTOLIN HFA) 108 (90 Base) MCG/ACT inhaler Inhale 2 puffs into the lungs every 4 (four) hours as needed for wheezing or shortness of breath. 1 Inhaler 1  . Amino Acids-Protein Hydrolys (FEEDING SUPPLEMENT, PRO-STAT 64,) LIQD Take 30 mLs by mouth daily. 887 mL 0  . amLODipine (NORVASC) 5 MG tablet Take 1 tablet (5 mg total) by mouth daily. 90 tablet 0  . atorvastatin (LIPITOR) 20 MG tablet Take 1 tablet (20 mg total) by mouth at bedtime. 90 tablet 0  . Biotin 10000 MCG TABS Take by mouth.    . calcium carbonate (TUMS - DOSED IN MG ELEMENTAL CALCIUM) 500 MG chewable tablet Chew 1 tablet by mouth daily.    . Cholecalciferol 25 MCG (1000 UT) tablet Take 1 tablet (1,000 Units total) by mouth daily. 30 tablet 1  . Cyanocobalamin (VITAMIN B-12) 1000 MCG SUBL Place under the tongue.    . cycloSPORINE (RESTASIS) 0.05 % ophthalmic emulsion Place 1 drop into both eyes 2 (two) times daily. 0.4 mL 1  . diclofenac sodium (VOLTAREN) 1 % GEL Apply small amount (2 grams) topically over knees or hands up to four times a day if needed for arthritis 100 g 0  . famotidine (PEPCID) 20 MG tablet Take 1 tablet (20 mg total) by mouth 2 (two) times daily as needed for heartburn or indigestion. 180 tablet 0  . ferrous sulfate 325 (65 FE) MG tablet Take 325 mg by mouth daily with breakfast.    . gabapentin (NEURONTIN) 100 MG capsule Take 100 mg by mouth 2 (two) times daily.    . Glucosamine-Chondroitin (GLUCOSAMINE CHONDR COMPLEX PO) Take by mouth.    . hydrOXYzine  (ATARAX/VISTARIL) 10 MG tablet TAKE 1 TO 2 TABLETS BY MOUTH ONCE DAILY AS NEEDED    . ipratropium (ATROVENT) 0.03 % nasal spray Place 2 sprays into both nostrils every 12 (twelve) hours.    Marland Kitchen levalbuterol (XOPENEX) 0.63 MG/3ML nebulizer solution Take 3 mLs (0.63 mg total) by nebulization every 4 (four) hours as needed for wheezing or shortness  of breath. 200 mL 0  . levalbuterol (XOPENEX) 1.25 MG/3ML nebulizer solution     . levothyroxine (SYNTHROID, LEVOTHROID) 100 MCG tablet Take 1 tablet (100 mcg total) by mouth daily at 6 (six) AM. 90 tablet 0  . linaclotide (LINZESS) 290 MCG CAPS capsule Take 1 capsule (290 mcg total) by mouth daily before breakfast. 30 capsule 0  . LORazepam (ATIVAN) 0.5 MG tablet TAKE 1 TABLET BY MOUTH AS NEEDED ONCE DAILY    . magnesium oxide (MAG-OX) 400 (241.3 Mg) MG tablet Take 0.5 tablets (200 mg total) by mouth 2 (two) times daily. (Patient taking differently: Take 500 mg by mouth daily. ) 60 tablet 1  . Multiple Vitamin (MULTIVITAMIN WITH MINERALS) TABS tablet Take 1 tablet by mouth daily. 30 tablet 1  . PARoxetine (PAXIL-CR) 25 MG 24 hr tablet Take 1 tablet (25 mg total) by mouth daily. 30 tablet 0  . polyvinyl alcohol (LIQUIFILM TEARS) 1.4 % ophthalmic solution Place 1 drop into both eyes as needed for dry eyes. 15 mL 0  . PREMARIN vaginal cream     . promethazine (PHENERGAN) 12.5 MG tablet Take 12.5 mg by mouth as needed.     . pyridoxine (B-6) 100 MG tablet Take 1 tablet (100 mg total) by mouth 2 (two) times daily. 60 tablet 0  . simethicone (MYLICON) 80 MG chewable tablet Chew by mouth.    Marland Kitchen tiZANidine (ZANAFLEX) 2 MG tablet Take by mouth every 6 (six) hours as needed for muscle spasms.    . traZODone (DESYREL) 50 MG tablet 50 mg as needed. Pt takes 1/2 tablet qhs    . UNABLE TO FIND Recliner with elevated leg rest, automatic and raises off of the floor to assist with standing 1 each 0  . VASCEPA 1 g CAPS TAKE 2 CAPSULES BY MOUTH TWICE DAILY (TO HELP WITH  TRIGLYCERIDES) 120 capsule 0   No current facility-administered medications on file prior to visit.      Family Hx: The patient's family history includes Anxiety disorder in her brother and brother; Arthritis in her brother, brother, and sister; COPD in her father; Cancer in her mother; Depression in her father and sister; Hearing loss in her father; Heart disease in her father; Hernia in her brother and brother; Stroke in her father; Varicose Veins in her father; Vision loss in her father. There is no history of Urolithiasis, Kidney disease, Kidney cancer, or Prostate cancer.  ROS:   Please see the history of present illness.    Review of Systems  Constitutional: Negative.   HENT: Negative.   Respiratory: Negative.   Cardiovascular: Negative.        Chest fullness, worse after eating  Gastrointestinal: Negative.   Musculoskeletal: Negative.   Neurological: Negative.   Psychiatric/Behavioral: Negative.   All other systems reviewed and are negative.     Labs/Other Tests and Data Reviewed:    Recent Labs: 02/26/2018: ALT 26; Magnesium 2.0; TSH 0.41 04/19/2018: BUN 15; Creatinine, Ser 0.60; Hemoglobin 12.1; Platelets 277; Potassium 3.9; Sodium 138   Recent Lipid Panel Lab Results  Component Value Date/Time   CHOL 171 02/26/2018 04:12 PM   TRIG 98 02/26/2018 04:12 PM   HDL 60 02/26/2018 04:12 PM   CHOLHDL 2.9 02/26/2018 04:12 PM   LDLCALC 92 02/26/2018 04:12 PM    Wt Readings from Last 3 Encounters:  04/19/18 125 lb (56.7 kg)  02/26/18 137 lb 4.8 oz (62.3 kg)  02/18/18 130 lb 1.1 oz (59 kg)  Exam:    Vital Signs: Vital signs may also be detailed in the HPI BP 140/88 Comment: without meds  Pulse 67   Ht 5\' 3"  (1.6 m)   BMI 22.14 kg/m   Wt Readings from Last 3 Encounters:  04/19/18 125 lb (56.7 kg)  02/26/18 137 lb 4.8 oz (62.3 kg)  02/18/18 130 lb 1.1 oz (59 kg)   Temp Readings from Last 3 Encounters:  05/14/18 (!) 97.1 F (36.2 C) (Oral)  05/03/18 (!) 96.5 F  (35.8 C)  04/19/18 98.1 F (36.7 C) (Oral)   BP Readings from Last 3 Encounters:  05/31/18 140/88  05/14/18 111/86  04/19/18 (!) 156/78   Pulse Readings from Last 3 Encounters:  05/31/18 67  05/14/18 75  04/19/18 64    110/80,pulse 70 resp 16  Well nourished, well developed female in no acute distress. Constitutional:  oriented to person, place, and time. No distress.  Head: Normocephalic and atraumatic.  Eyes:  no discharge. No scleral icterus.  Neck: Normal range of motion. Neck supple.  Pulmonary/Chest: No audible wheezing, no distress, appears comfortable Musculoskeletal: Normal range of motion.  no  tenderness or deformity.  Neurological:   Coordination normal. Full exam not performed Skin:  No rash Psychiatric:  normal mood and affect. behavior is normal. Thought content normal.    ASSESSMENT & PLAN:    Aortic atherosclerosis (HCC) Minimal aortic athero in the descending aorta No significant coronary calcification, no significant carotid calcification  Chest pain of uncertain etiology Atypical in nature, likely secondary to large hiatal hernia Given no aortic atherosclerosis, no coronary calcification, atypical in nature, no further testing at this time  Hiatal hernia Long discussion with her, she is interested in having hiatal hernia repaired We will try to identify some surgeons for her Would likely need to refer back to Dr. Sanda Klein We did identify a surgeon for her hernia at [facility], Cammie Sickle .  As one possible option  Benign essential HTN Blood pressure is well controlled on today's visit. No changes made to the medications.   COVID-19 Education: The signs and symptoms of COVID-19 were discussed with the patient and how to seek care for testing (follow up with PCP or arrange E-visit).  The importance of social distancing was discussed today.  Patient Risk:   After full review of this patients clinical status, I feel that they are at least moderate risk  at this time.  Time:   Today, I have spent 45 minutes with the patient with telehealth technology discussing the cardiac and medical problems/diagnoses detailed above   10 min spent reviewing the chart prior to patient visit today   Medication Adjustments/Labs and Tests Ordered: Current medicines are reviewed at length with the patient today.  Concerns regarding medicines are outlined above.   Tests Ordered: No tests ordered   Medication Changes: No changes made   Disposition: Follow-up as needed   Signed, Ida Rogue, MD  05/31/2018 10:42 AM    [place] Office [address], [place], [place] [address]

## 2018-05-30 NOTE — Telephone Encounter (Signed)
Left verbal orders on Stephanies VM.

## 2018-05-30 NOTE — Telephone Encounter (Signed)
Copied from [place]. Topic: General - Other >> May 29, 2018  3:17 PM Antonieta Iba C wrote: Reason for CRM:  Colletta Maryland with home health services. called in to request OT orders.   Frequency 1 week 4   [phone]   Colletta Maryland says that pt is also requesting a call back at [phone]

## 2018-05-31 ENCOUNTER — Telehealth (INDEPENDENT_AMBULATORY_CARE_PROVIDER_SITE_OTHER): Payer: Medicare Other | Admitting: Cardiovascular Disease

## 2018-05-31 ENCOUNTER — Telehealth: Payer: Self-pay | Admitting: Family Medicine

## 2018-05-31 ENCOUNTER — Other Ambulatory Visit: Payer: Self-pay

## 2018-05-31 VITALS — BP 140/88 | HR 67 | Ht 63.0 in

## 2018-05-31 DIAGNOSIS — H04123 Dry eye syndrome of bilateral lacrimal glands: Secondary | ICD-10-CM

## 2018-05-31 DIAGNOSIS — R0789 Other chest pain: Secondary | ICD-10-CM

## 2018-05-31 DIAGNOSIS — K219 Gastro-esophageal reflux disease without esophagitis: Secondary | ICD-10-CM

## 2018-05-31 DIAGNOSIS — I700 Atherosclerosis of aorta: Secondary | ICD-10-CM | POA: Diagnosis not present

## 2018-05-31 DIAGNOSIS — I10 Essential (primary) hypertension: Secondary | ICD-10-CM | POA: Diagnosis not present

## 2018-05-31 DIAGNOSIS — G8929 Other chronic pain: Secondary | ICD-10-CM

## 2018-05-31 DIAGNOSIS — K449 Diaphragmatic hernia without obstruction or gangrene: Secondary | ICD-10-CM | POA: Diagnosis not present

## 2018-05-31 DIAGNOSIS — E781 Pure hyperglyceridemia: Secondary | ICD-10-CM

## 2018-05-31 DIAGNOSIS — R079 Chest pain, unspecified: Secondary | ICD-10-CM | POA: Insufficient documentation

## 2018-05-31 DIAGNOSIS — M25562 Pain in left knee: Secondary | ICD-10-CM

## 2018-05-31 DIAGNOSIS — E039 Hypothyroidism, unspecified: Secondary | ICD-10-CM

## 2018-05-31 HISTORY — DX: Chest pain, unspecified: R07.9

## 2018-05-31 NOTE — Patient Instructions (Addendum)
Medication Instructions:  No changes  If you need a refill on your cardiac medications before your next appointment, please call your pharmacy.    Lab work: No new labs needed   If you have labs (blood work) drawn today and your tests are completely normal, you will receive your results only by: Marland Kitchen MyChart Message (if you have MyChart) OR . A paper copy in the mail If you have any lab test that is abnormal or we need to change your treatment, we will call you to review the results.   Testing/Procedures: No new testing needed   Follow-Up: At [facility], you and your health needs are our priority.  As part of our continuing mission to provide you with exceptional heart care, we have created designated Provider Care Teams.  These Care Teams include your primary Cardiologist (physician) and Advanced Practice Providers (APPs -  Physician Assistants and Nurse Practitioners) who all work together to provide you with the care you need, when you need it.  . You will need a follow up appointment as needed   . Providers on your designated Care Team:   . Murray Hodgkins, NP . Christell Faith, PA-C . Marrianne Mood, PA-C    Any Other Special Instructions Will Be Listed Below (If Applicable).  Surgeon at [facility] for your hiatal hernia repair/surgery Kris Hartmann. Margart Sickles, MD Gastroenterologist in [place], [place] Address: [address] [place], [place] [address] Phone: [phone]    For educational health videos Log in to : [email or web address] Or : [email or web address], password : [email or web address]

## 2018-05-31 NOTE — Telephone Encounter (Signed)
Please add the following medications to the refill request for 3 month supply  Restasis Atorvastatin Diclofenac Sodium

## 2018-05-31 NOTE — Telephone Encounter (Signed)
Patient will be leaving the practice.  She would like a 3 month supply on the following medications until she can get established with other office.  Amlodipine Vascepa Linzess Famotidine Levothyroxine Premarin (35month supply) patient states that she knows Dr. Sanda Klein has not prescribed but she is waiting to get in with GYN. Ventolin Inhaler (36month supply) waiting to see Pulmonologist at the end of the month.  Patient would also like a lab order sent to [facility] to have Vit D, B12, TSH, Lipid, Potassium, and Sodium levels checked.   Prescriptions should be sent to [facility] on [address].

## 2018-06-03 MED ORDER — AMLODIPINE BESYLATE 5 MG PO TABS: 5.0000 mg | ORAL_TABLET | Freq: Every day | ORAL | 0 refills | Status: DC

## 2018-06-03 MED ORDER — FAMOTIDINE 20 MG PO TABS: 20.0000 mg | ORAL_TABLET | Freq: Two times a day (BID) | ORAL | 0 refills | Status: DC | PRN

## 2018-06-03 MED ORDER — ATORVASTATIN CALCIUM 20 MG PO TABS: 20.0000 mg | ORAL_TABLET | Freq: Every day | ORAL | 0 refills | Status: DC

## 2018-06-03 MED ORDER — ALBUTEROL SULFATE HFA 108 (90 BASE) MCG/ACT IN AERS: 2.0000 | INHALATION_SPRAY | RESPIRATORY_TRACT | 0 refills | Status: DC | PRN

## 2018-06-03 MED ORDER — CYCLOSPORINE 0.05 % OP EMUL: 1.0000 [drp] | Freq: Two times a day (BID) | OPHTHALMIC | 0 refills | Status: DC

## 2018-06-03 MED ORDER — ICOSAPENT ETHYL 1 G PO CAPS: ORAL_CAPSULE | ORAL | 0 refills | Status: DC

## 2018-06-03 MED ORDER — LEVOTHYROXINE SODIUM 100 MCG PO TABS: 100.0000 ug | ORAL_TABLET | Freq: Every day | ORAL | 0 refills | Status: DC

## 2018-06-03 MED ORDER — DICLOFENAC SODIUM 1 % TD GEL: TRANSDERMAL | 0 refills | Status: DC

## 2018-06-03 NOTE — Telephone Encounter (Signed)
-  Ventolin - provided 1 month supply -Premarin - Declined - has never been filled by Dr. Sanda Klein, will need to obtain from prescribing provider. -Levothyroxine - provided 90 day supply -Famotidine - provided 90 day supply -Linzess - Declined - last filled by Dr. Weber Cooks 12/26/2017 for 30 tablets.  Has never been filled by Dr. Sanda Klein.  Will need to obtain from him. -Vascepa - provided 90 day supply -Amlodipine - provided 90 day supply -Atorvastatin - provided 90 day supply -Restasis - provided 1 additional refill - Benjamine Mola provided 2 fills on 05/14/2018. -Voltaren - 1 refill provided  - Potassium and sodium levels were checked 04/19/2018 and were normal. - TSH checked 03/14/2018 and was normal, appears to have been stable for the last several checks. - Vitamin B12 was normal 12/07/2017 - Vitamin D was normal 12/07/2017 - Insurance does not typically pay for these more than once a year if they are normal. - No labs will be ordered at this time.

## 2018-06-05 ENCOUNTER — Ambulatory Visit
Admission: RE | Admit: 2018-06-05 | Discharge: 2018-06-05 | Disposition: A | Discharge status: Home / Self Care | Payer: Medicare Other | Attending: Otolaryngology | Admitting: Otolaryngology

## 2018-06-05 ENCOUNTER — Other Ambulatory Visit: Payer: Self-pay | Admitting: Otolaryngology

## 2018-06-05 ENCOUNTER — Other Ambulatory Visit: Payer: Self-pay

## 2018-06-05 ENCOUNTER — Ambulatory Visit
Admission: RE | Admit: 2018-06-05 | Discharge: 2018-06-05 | Disposition: A | Discharge status: Home / Self Care | Payer: Medicare Other | Source: Ambulatory Visit | Attending: Otolaryngology | Admitting: Otolaryngology

## 2018-06-05 DIAGNOSIS — J329 Chronic sinusitis, unspecified: Secondary | ICD-10-CM

## 2018-06-06 ENCOUNTER — Telehealth: Payer: Self-pay | Admitting: Family Medicine

## 2018-06-06 ENCOUNTER — Telehealth: Payer: Self-pay

## 2018-06-06 ENCOUNTER — Telehealth: Payer: Self-pay | Admitting: Cardiovascular Disease

## 2018-06-06 DIAGNOSIS — K449 Diaphragmatic hernia without obstruction or gangrene: Secondary | ICD-10-CM

## 2018-06-06 NOTE — Telephone Encounter (Signed)
Telephone visit.  

## 2018-06-06 NOTE — Telephone Encounter (Signed)
Copied from [place] [id number]. Topic: Quick Communication - See Telephone Encounter >> Jun 06, 2018  9:03 AM Berneta Levins wrote: CRM for notification. See Telephone encounter for: 06/06/18.  Pt called and states she needs to speak directly with the doctor about Diflucan.  Pt will not let me know if she needs this medication or has a questions, she states she will not answer any more questions since I am not the doctor who is treating her.

## 2018-06-06 NOTE — Telephone Encounter (Signed)
Pt states her and Dr. Sanda Klein spoke about a power lift chair and she is needing rx.  Please advise

## 2018-06-06 NOTE — Telephone Encounter (Signed)
Pt would like a recommendation from Dr Rockey Situ for a GI doctor. Pt request this by 1 pm

## 2018-06-06 NOTE — Telephone Encounter (Signed)
Power Risk analyst

## 2018-06-06 NOTE — Telephone Encounter (Signed)
She will need be evaluated.

## 2018-06-06 NOTE — Telephone Encounter (Signed)
Per Dr. Rockey Situ, he would recommend: Angela [name] at [facility] [phone]  1) I spoke with the patient. She states she has seen Dr. Allen Norris in the past, but at her e-visit with Dr. Rockey Situ, he was "glowing" about a GI doctor at [facility] and she would like the name of that physician.  I apologized I was not certain of whom that was as her initial message did not specify that she was inquiring about a physician at [facility].  Per Dr. Donivan Scull note on 05/31/18: Hiatal hernia Long discussion with her, she is interested in having hiatal hernia repaired We will try to identify some surgeons for her Would likely need to refer back to Dr. Sanda Klein We did identify a surgeon for her hernia at [facility] .  As one possible option   The patient is aware I will confirm with Dr. Rockey Situ about the MD at [facility]. I am unsure if it was Dr. Cammie Sickle as listed above.  2) The patient also states that she forgot to mention at her e-visit with Dr. Rockey Situ, that she has been having L>R lower extremity swelling x 3-6 months.  She advises: - she is more SOB - she does not weigh at home, she weighed at an MD office on 5/20 & was 138 lbs (last weight in Epic from 02/2018 was 137 lbs + wearing a boot) - she is wearing support socks, but still as swelling - she does notice that swelling will improve overnight so that she can see her ankles - she is not on a diuretic (she has hyponatremia & gets easily dehydrated) - she drinks gatorade, but has done this for some time - she is on amlodipine, but has taken this for 10 years  The patient is aware that I will need to review the above with Dr. Rockey Situ and we will have to call her back with further recommendations.

## 2018-06-06 NOTE — Telephone Encounter (Signed)
Patient states was recently on doxy and was given 1 diflucan pill, but did not do the trick can she get refill?

## 2018-06-06 NOTE — Telephone Encounter (Signed)
Spoke to patient regarding issues: Diflucan refill sent to [facility] sent to [place] for power lift [employer] in all rx's Raquel Sarna filled to make sure [facility] had

## 2018-06-06 NOTE — Telephone Encounter (Signed)
Pt notified and appt scheduled.

## 2018-06-06 NOTE — Telephone Encounter (Signed)
This is something that requires a visit and extensive evaluation.  She is leaving the practice per her report, but is welcome to schedule an appointment within her 30 day window or wait for new PCP to determine need.

## 2018-06-06 NOTE — Telephone Encounter (Signed)
Message received from Dr. Rockey Situ- to refer back to the physician listed in her note.  I notified the patient that the recommendation for her hiatal hernia surgery was Dr. Cammie Sickle at [facility].  [phone]) [phone].  She is now requesting Dr. Rockey Situ send a referral to Dr. Laroy Apple office and also a written copy of the referral to her to submit to Medicare.  She is aware we will need to get Dr. Donivan Scull ok for the referral.  We will call her back on this and MD recommendations in regards to her swelling.

## 2018-06-06 NOTE — Telephone Encounter (Signed)
That would be fine 

## 2018-06-06 NOTE — Telephone Encounter (Signed)
I am happy to place a referral but do not want to step on any toes We will check with Dr. Delight Ovens office to make sure they are okay with referral to [facility] for consideration of hiatal hernia surgery.  They may want it to come from their office thx TVG

## 2018-06-07 NOTE — Telephone Encounter (Signed)
Dr. Rockey Situ,   2 things:  1) See note from Raelyn Ensign, NP- do you want primary care to refer to [facility]?  2) Further down in the message from yesterday the patient was complaining of swelling she forgot to discuss with you at her e-visit- please review and advise.  Thank you!

## 2018-06-07 NOTE — Telephone Encounter (Signed)
Documentation reviewed in Dr. Delight Ovens absence.  We are happy to place referral or have Dr. Rockey Situ place. Thank you!

## 2018-06-08 ENCOUNTER — Other Ambulatory Visit: Payer: Self-pay

## 2018-06-08 ENCOUNTER — Emergency Department
Admission: EM | Admit: 2018-06-08 | Discharge: 2018-06-08 | Disposition: A | Discharge status: Home / Self Care | Payer: Medicare Other | Attending: Emergency Medicine | Admitting: Emergency Medicine

## 2018-06-08 ENCOUNTER — Emergency Department: Payer: Medicare Other

## 2018-06-08 ENCOUNTER — Encounter: Payer: Self-pay | Admitting: Emergency Medicine

## 2018-06-08 DIAGNOSIS — Y939 Activity, unspecified: Secondary | ICD-10-CM | POA: Diagnosis not present

## 2018-06-08 DIAGNOSIS — X58XXXA Exposure to other specified factors, initial encounter: Secondary | ICD-10-CM | POA: Insufficient documentation

## 2018-06-08 DIAGNOSIS — Z87891 Personal history of nicotine dependence: Secondary | ICD-10-CM | POA: Diagnosis not present

## 2018-06-08 DIAGNOSIS — S93402A Sprain of unspecified ligament of left ankle, initial encounter: Secondary | ICD-10-CM | POA: Diagnosis not present

## 2018-06-08 DIAGNOSIS — Z79899 Other long term (current) drug therapy: Secondary | ICD-10-CM | POA: Insufficient documentation

## 2018-06-08 DIAGNOSIS — Z20828 Contact with and (suspected) exposure to other viral communicable diseases: Secondary | ICD-10-CM | POA: Diagnosis not present

## 2018-06-08 DIAGNOSIS — E860 Dehydration: Secondary | ICD-10-CM | POA: Diagnosis not present

## 2018-06-08 DIAGNOSIS — J069 Acute upper respiratory infection, unspecified: Secondary | ICD-10-CM | POA: Insufficient documentation

## 2018-06-08 DIAGNOSIS — Y929 Unspecified place or not applicable: Secondary | ICD-10-CM | POA: Diagnosis not present

## 2018-06-08 DIAGNOSIS — Z91010 Allergy to peanuts: Secondary | ICD-10-CM | POA: Diagnosis not present

## 2018-06-08 DIAGNOSIS — S99912A Unspecified injury of left ankle, initial encounter: Secondary | ICD-10-CM | POA: Diagnosis present

## 2018-06-08 DIAGNOSIS — E039 Hypothyroidism, unspecified: Secondary | ICD-10-CM | POA: Diagnosis not present

## 2018-06-08 DIAGNOSIS — Y999 Unspecified external cause status: Secondary | ICD-10-CM | POA: Diagnosis not present

## 2018-06-08 LAB — CBC WITH DIFFERENTIAL/PLATELET
Abs Immature Granulocytes: 0.01 10*3/uL (ref 0.00–0.07)
Basophils Absolute: 0.1 10*3/uL (ref 0.0–0.1)
Basophils Relative: 1 %
Eosinophils Absolute: 0.1 10*3/uL (ref 0.0–0.5)
Eosinophils Relative: 3 %
HCT: 36.4 % (ref 36.0–46.0)
Hemoglobin: 12.4 g/dL (ref 12.0–15.0)
Immature Granulocytes: 0 %
Lymphocytes Relative: 36 %
Lymphs Abs: 1.9 10*3/uL (ref 0.7–4.0)
MCH: 30.6 pg (ref 26.0–34.0)
MCHC: 34.1 g/dL (ref 30.0–36.0)
MCV: 89.9 fL (ref 80.0–100.0)
Monocytes Absolute: 0.6 10*3/uL (ref 0.1–1.0)
Monocytes Relative: 11 %
Neutro Abs: 2.7 10*3/uL (ref 1.7–7.7)
Neutrophils Relative %: 49 %
Platelets: 272 10*3/uL (ref 150–400)
RBC: 4.05 MIL/uL (ref 3.87–5.11)
RDW: 13.2 % (ref 11.5–15.5)
WBC: 5.4 10*3/uL (ref 4.0–10.5)
nRBC: 0 % (ref 0.0–0.2)

## 2018-06-08 LAB — COMPREHENSIVE METABOLIC PANEL
ALT: 20 U/L (ref 0–44)
AST: 24 U/L (ref 15–41)
Albumin: 4.1 g/dL (ref 3.5–5.0)
Alkaline Phosphatase: 90 U/L (ref 38–126)
Anion gap: 9 (ref 5–15)
BUN: 12 mg/dL (ref 8–23)
CO2: 27 mmol/L (ref 22–32)
Calcium: 9.1 mg/dL (ref 8.9–10.3)
Chloride: 98 mmol/L (ref 98–111)
Creatinine, Ser: 0.66 mg/dL (ref 0.44–1.00)
GFR calc Af Amer: >60 mL/min (ref >60)
GFR calc non Af Amer: >60 mL/min (ref >60)
Glucose, Bld: 77 mg/dL (ref 70–99)
Potassium: 3.6 mmol/L (ref 3.5–5.1)
Sodium: 134 mmol/L — ABNORMAL LOW (ref 135–145)
Total Bilirubin: 0.5 mg/dL (ref 0.3–1.2)
Total Protein: 7.1 g/dL (ref 6.5–8.1)

## 2018-06-08 LAB — SALICYLATE LEVEL: Salicylate Lvl: <7 mg/dL (ref 2.8–30.0)

## 2018-06-08 LAB — BRAIN NATRIURETIC PEPTIDE: B Natriuretic Peptide: 55 pg/mL (ref 0.0–100.0)

## 2018-06-08 LAB — ETHANOL: Alcohol, Ethyl (B): <10 mg/dL (ref <10)

## 2018-06-08 LAB — ACETAMINOPHEN LEVEL: Acetaminophen (Tylenol), Serum: <10 ug/mL — ABNORMAL LOW (ref 10–30)

## 2018-06-08 MED ORDER — FLUCONAZOLE 150 MG PO TABS: ORAL_TABLET | ORAL | 0 refills | Status: DC

## 2018-06-08 MED ORDER — AZITHROMYCIN 250 MG PO TABS: ORAL_TABLET | ORAL | 0 refills | Status: DC

## 2018-06-08 NOTE — ED Triage Notes (Signed)
Pt to ED via POV c/o left ankle pain. Pt states that she bent over and heard a pop and now has pain and swelling in her ankle. Pt also reports cough x 1 month. Pt is in NAD.

## 2018-06-08 NOTE — ED Notes (Signed)
Patient taken to the BR.  Taken in Radio broadcast assistant.  Tolerated well.  Patient is AAOx3.  Argumentative.  Resistant to assistance unless she is dictating conversation and order of events.  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

## 2018-06-08 NOTE — ED Notes (Signed)
AAOx3.  Skin warm and dry.  NAD 

## 2018-06-08 NOTE — Discharge Instructions (Addendum)
Follow-up with Dr. Truitt Leep.  Please call for an appointment.  Wear the ankle splint to give you extra support.  If the area is worsening and he cannot move your foot up and down please return to the emergency department.

## 2018-06-08 NOTE — ED Provider Notes (Signed)
[facility] Emergency Department Provider Note  ____________________________________________   First MD Initiated Contact with Patient 06/08/18 1222     (approximate)  I have reviewed the triage vital signs and the nursing notes.   HISTORY  Chief Complaint Ankle Pain    HPI Angela [name] is a 68 y.o. female presents emergency department complaining of left ankle pain.  She states she is also had a cough for about a month where she gets green sputum.  Her regular doctor did a chest x-ray and told her it was her allergies.  She denies any fever or chills.  She denies chest pain or shortness of breath but feels that she is dehydrated.  Patient has multiple complaints of social problems such as her landlord is not helping her, her family has disassociated from her, she does not have any money, she does not have finances, etc.   Past Medical History:  Diagnosis Date  . Allergic rhinitis   . Anemia   . Blurred vision   . Depression   . Diverticulosis   . Endometriosis   . Falls   . GERD (gastroesophageal reflux disease)   . Hernia, hiatal   . Hypothyroidism   . IBS (irritable bowel syndrome)   . Malignant neoplasm of skin   . Migraine   . Neuropathy   . PTSD (post-traumatic stress disorder)   . Thyroid disease     Patient Active Problem List   Diagnosis Date Noted  . Aortic atherosclerosis ([place]) 05/31/2018  . Chest pain of uncertain etiology 29/56/2130  . Achilles tendon disorder 04/25/2018  . Insomnia 04/25/2018  . Diastolic dysfunction 86/57/8469  . Mitral regurgitation 03/29/2018  . COPD (chronic obstructive pulmonary disease) ([place]) 03/15/2018  . Bronchospasm 03/15/2018  . Axis II diagnosis deferred 02/20/2018  . Preventative health care 02/08/2018  . Polypharmacy 02/01/2018  . Personality disorder ([place]) 12/25/2017  . Noncompliance 12/25/2017  . Medication monitoring encounter 12/07/2017  . Family history of high cholesterol  12/07/2017  . Arthritis of hand, degenerative 12/07/2017  . Bipolar I disorder, most recent episode mixed, severe with psychotic features ([place]) 09/18/2017  . Agitation 09/18/2017  . Depressed mood 07/11/2017  . Hallux valgus, acquired 07/05/2017  . Hammer toes of both feet 07/05/2017  . Unstable ankle 07/05/2017  . Chronic bilateral low back pain without sciatica 06/09/2017  . Tinea unguium 05/08/2017  . Unresolved grief 05/08/2017  . Conductive hearing loss of right ear with unrestricted hearing of left ear 04/03/2017  . Adjustment disorder with depressed mood 01/18/2017  . Migraine 01/18/2017  . Postmenopausal bleeding 01/18/2017  . IBS (irritable bowel syndrome) 11/17/2016  . Bilateral carpal tunnel syndrome 11/17/2016  . Plantar fasciitis, bilateral 11/17/2016  . Cervical myofascial pain syndrome 11/17/2016  . Chronic hyponatremia 11/17/2016  . PTSD (post-traumatic stress disorder) 11/17/2016  . Hiatal hernia 11/17/2016  . Vitamin D deficiency 11/17/2016  . Vitamin B12 deficiency 11/17/2016  . SIADH (syndrome of inappropriate ADH production) ([place]) 09/08/2016  . Cervical spondylosis with myelopathy 05/24/2016  . Post-concussion headache 05/24/2016  . MCI (mild cognitive impairment) with memory loss 03/30/2016  . Gait abnormality 03/30/2016  . Chronic bipolar affective disorder ([place]) 01/24/2016  . Recurrent major depressive disorder, in partial remission ([place]) 01/24/2016  . Anemia, iron deficiency 06/24/2015  . Endometriosis 06/24/2015  . Benign essential HTN 06/24/2015  . History of falling 05/22/2015  . History of TIA (transient ischemic attack) 04/20/2015  . Hypothyroidism 04/20/2015  . GERD (gastroesophageal reflux disease) 04/20/2015  .  Cervical disc disorder at C5-C6 level with radiculopathy 03/25/2015  . Chronic neck pain 03/25/2015  . Pelvic pain in female 10/30/2014  . History of skin cancer 06/30/2014  . Chronic sinusitis 06/30/2014  . Chronic headaches  12/16/2013  . Neuropathy 12/16/2013    Past Surgical History:  Procedure Laterality Date  . APPENDECTOMY    . CHOLECYSTECTOMY    . CHOLECYSTECTOMY, LAPAROSCOPIC    . ESOPHAGOGASTRODUODENOSCOPY (EGD) WITH PROPOFOL N/A 03/29/2015   Procedure: ESOPHAGOGASTRODUODENOSCOPY (EGD) WITH PROPOFOL;  Surgeon: Josefine Class, MD;  Location: [facility] ENDOSCOPY;  Service: Endoscopy;  Laterality: N/A;  . HERNIA REPAIR    . laproscopy    . TONSILLECTOMY    . uteral suspension      Prior to Admission medications   Medication Sig Start Date End Date Taking? Authorizing Provider  acetaminophen (TYLENOL) 650 MG CR tablet Take 1 tablet (650 mg total) by mouth every 8 (eight) hours as needed for pain. 04/12/18   Poulose, Bethel Born, NP  albuterol (VENTOLIN HFA) 108 (90 Base) MCG/ACT inhaler Inhale 2 puffs into the lungs every 4 (four) hours as needed for wheezing or shortness of breath. 06/03/18   Hubbard Hartshorn, FNP  Amino Acids-Protein Hydrolys (FEEDING SUPPLEMENT, PRO-STAT 64,) LIQD Take 30 mLs by mouth daily. 04/12/18   Poulose, Bethel Born, NP  amLODipine (NORVASC) 5 MG tablet Take 1 tablet (5 mg total) by mouth daily. 06/03/18   Hubbard Hartshorn, FNP  atorvastatin (LIPITOR) 20 MG tablet Take 1 tablet (20 mg total) by mouth at bedtime. 06/03/18   Hubbard Hartshorn, FNP  azithromycin (ZITHROMAX Z-PAK) 250 MG tablet 2 pills today then 1 pill a day for 4 days 06/08/18   Versie Starks, PA-C  Biotin 10000 MCG TABS Take by mouth.    [provider]  calcium carbonate (TUMS - DOSED IN MG ELEMENTAL CALCIUM) 500 MG chewable tablet Chew 1 tablet by mouth daily.    [provider]  Cholecalciferol 25 MCG (1000 UT) tablet Take 1 tablet (1,000 Units total) by mouth daily. 12/26/17   Clapacs, Madie Reno, MD  Cyanocobalamin (VITAMIN B-12) 1000 MCG SUBL Place under the tongue.    [provider]  cycloSPORINE (RESTASIS) 0.05 % ophthalmic emulsion Place 1 drop into both eyes 2 (two) times daily. 06/03/18    Hubbard Hartshorn, FNP  diclofenac sodium (VOLTAREN) 1 % GEL Apply small amount (2 grams) topically over knees or hands up to four times a day if needed for arthritis 06/03/18   Hubbard Hartshorn, FNP  famotidine (PEPCID) 20 MG tablet Take 1 tablet (20 mg total) by mouth 2 (two) times daily as needed for heartburn or indigestion. 06/03/18   Hubbard Hartshorn, FNP  ferrous sulfate 325 (65 FE) MG tablet Take 325 mg by mouth daily with breakfast.    [provider]  fluconazole (DIFLUCAN) 150 MG tablet Take one now and one in a week 06/08/18   Caryn Section, Linden Dolin, PA-C  gabapentin (NEURONTIN) 100 MG capsule Take 100 mg by mouth 2 (two) times daily.    [provider]  Glucosamine-Chondroitin (GLUCOSAMINE CHONDR COMPLEX PO) Take by mouth.    [provider]  hydrOXYzine (ATARAX/VISTARIL) 10 MG tablet TAKE 1 TO 2 TABLETS BY MOUTH ONCE DAILY AS NEEDED 02/05/18   [provider]  Icosapent Ethyl (VASCEPA) 1 g CAPS TAKE 2 CAPSULES BY MOUTH TWICE DAILY (TO HELP WITH TRIGLYCERIDES) 06/03/18   Hubbard Hartshorn, FNP  ipratropium (ATROVENT) 0.03 % nasal  spray Place 2 sprays into both nostrils every 12 (twelve) hours.    [provider]  levothyroxine (SYNTHROID) 100 MCG tablet Take 1 tablet (100 mcg total) by mouth daily at 6 (six) AM. 06/03/18   Hubbard Hartshorn, FNP  linaclotide [facility]) 290 MCG CAPS capsule Take 1 capsule (290 mcg total) by mouth daily before breakfast. 12/26/17   Clapacs, Madie Reno, MD  LORazepam (ATIVAN) 0.5 MG tablet TAKE 1 TABLET BY MOUTH AS NEEDED ONCE DAILY 02/06/18   [provider]  magnesium oxide (MAG-OX) 400 (241.3 Mg) MG tablet Take 0.5 tablets (200 mg total) by mouth 2 (two) times daily. Patient taking differently: Take 500 mg by mouth daily.  12/26/17   Clapacs, Madie Reno, MD  Multiple Vitamin (MULTIVITAMIN WITH MINERALS) TABS tablet Take 1 tablet by mouth daily. 12/26/17   Clapacs, Madie Reno, MD  PARoxetine (PAXIL-CR) 25 MG 24 hr tablet Take 1 tablet (25 mg  total) by mouth daily. 12/27/17   Clapacs, Madie Reno, MD  polyvinyl alcohol (LIQUIFILM TEARS) 1.4 % ophthalmic solution Place 1 drop into both eyes as needed for dry eyes. 12/26/17   Clapacs, Madie Reno, MD  PREMARIN vaginal cream  01/31/18   [provider]  promethazine (PHENERGAN) 12.5 MG tablet Take 12.5 mg by mouth as needed.  02/05/18 08/04/18  [provider]  pyridoxine (B-6) 100 MG tablet Take 1 tablet (100 mg total) by mouth 2 (two) times daily. 12/26/17   Clapacs, Madie Reno, MD  simethicone (MYLICON) 80 MG chewable tablet Chew by mouth. 02/05/18 02/05/19  [provider]  tiZANidine (ZANAFLEX) 2 MG tablet Take by mouth every 6 (six) hours as needed for muscle spasms.    [provider]  traZODone (DESYREL) 50 MG tablet 50 mg as needed. Pt takes 1/2 tablet qhs 01/31/18   [provider]  UNABLE TO [place] with elevated leg rest, automatic and raises off of the floor to assist with standing 04/25/18   Lada, Satira Anis, MD    Allergies Advair diskus [fluticasone-salmeterol]; Bee venom; Darvon [propoxyphene]; Dicyclomine; Other; Oxycodone-acetaminophen; Peanuts [peanut oil]; Penicillins; Sulfa antibiotics; Sulfacetamide sodium; Ultram [tramadol]; Amikacin; Aspirin; Doxycycline; Haloperidol; Hymenoptera venom preparations; Imipramine; Keflex [cephalexin]; Lactose intolerance (gi); Risperdal [risperidone]; Sulfur; Adhesive [tape]; Albuterol; Ciprofloxacin; Hydroxyzine; Peanut butter flavor; and Prednisone  Family History  Problem Relation Age of Onset  . Heart disease Father   . Hearing loss Father   . COPD Father   . Depression Father   . Stroke Father   . Vision loss Father   . Varicose Veins Father   . Cancer Mother        lung  . Depression Sister   . Arthritis Sister   . Arthritis Brother   . Hernia Brother   . Anxiety disorder Brother   . Arthritis Brother   . Hernia Brother   . Anxiety disorder Brother   . Urolithiasis Neg Hx   . Kidney  disease Neg Hx   . Kidney cancer Neg Hx   . Prostate cancer Neg Hx     Social History Social History   Tobacco Use  . Smoking status: Former Smoker    Last attempt to quit: 11/17/1976    Years since quitting: 41.5  . Smokeless tobacco: Never Used  Substance Use Topics  . Alcohol use: No  . Drug use: No    Review of Systems  Constitutional: No fever/chills Eyes: No visual changes. ENT: No sore throat. Respiratory: Positive cough Genitourinary: Negative for  dysuria. Musculoskeletal: Negative for back pain.  Positive for left ankle pain Skin: Negative for rash. Psychiatric: Positive for multiple social type complaints   ____________________________________________   PHYSICAL EXAM:  VITAL SIGNS: ED Triage Vitals  Enc Vitals Group     BP 06/08/18 1216 127/75     Pulse --      Resp 06/08/18 1214 16     Temp 06/08/18 1214 98.4 F (36.9 C)     Temp Source 06/08/18 1214 Oral     SpO2 --      Weight 06/08/18 1216 150 lb (68 kg)     Height 06/08/18 1216 '5\' 3"'$  (1.6 m)     Head Circumference --      Peak Flow --      Pain Score 06/08/18 1215 9     Pain Loc --      Pain Edu? --      Excl. in [place]? --     Constitutional: Alert and oriented. Well appearing and in no acute distress.  Patient is argumentative Eyes: Conjunctivae are normal.  Head: Atraumatic. Nose: No congestion/rhinnorhea. Mouth/Throat: Mucous membranes are moist.   Neck:  supple no lymphadenopathy noted Cardiovascular: Normal rate, regular rhythm. Heart sounds are normal Respiratory: Normal respiratory effort.  No retractions, lungs c t a cough is dry GU: deferred Musculoskeletal: Positive for left ankle tenderness, Achilles is minimally tender, patient is unwilling to move her foot admits to being argumentative  neurologic:  Normal speech and language.  Skin:  Skin is warm, dry and intact. No rash noted. Psychiatric: Patient is argumentative with scattered thoughts  ____________________________________________   LABS (all labs ordered are listed, but only abnormal results are displayed)  Labs Reviewed  COMPREHENSIVE METABOLIC PANEL - Abnormal; Notable for the following components:      Result Value   Sodium 134 (*)    All other components within normal limits  ACETAMINOPHEN LEVEL - Abnormal; Notable for the following components:   Acetaminophen (Tylenol), Serum <10 (*)    All other components within normal limits  NOVEL CORONAVIRUS, NAA (HOSPITAL ORDER, SEND-OUT TO REF LAB)  CBC WITH DIFFERENTIAL/PLATELET  BRAIN NATRIURETIC PEPTIDE  ETHANOL  SALICYLATE LEVEL   ____________________________________________   ____________________________________________  RADIOLOGY  Chest x-ray is normal X-ray left ankle is normal  ____________________________________________   PROCEDURES  Procedure(s) performed: No  Procedures    ____________________________________________   INITIAL IMPRESSION / ASSESSMENT AND PLAN / ED COURSE  Pertinent labs & imaging results that were available during my care of the patient were reviewed by me and considered in my medical decision making (see chart for details).   Patient 68 year old female with a psychiatric history who presents to the emergency department complaining of a one-month cough and left ankle pain.  She also states she thinks she is dehydrated.  Physical exam shows patient to be thin and disheveled.  Lungs are clear to auscultation, left ankle is tender to palpation  Chest x-ray and left ankle x-ray ordered  Due to the patient scattered thoughts and argumentative behavior she states she wants to see a doctor and not a PA.  She states she thinks that a PA is not appropriate for her care.  Therefore when there is a bed available on the major side she will be moved to the major.  I have concerns that the patient needs to be evaluated for her psychiatric problems.   Patient's test results are  normal, CBC, met B acetaminophen and salicylate levels are normal.  BNP is normal.  X-ray of the left ankle is also normal.  X-ray of the chest is normal.  Explained all of the findings to the patient.  She is more cooperative at this time and states she does not need to see an MD that she is happy with her care at this time.  She was placed in a stirrup splint.  She was given a prescription for Z-Pak and Diflucan.  She is to follow-up with her regular doctor.  Due to the chronic cough and frequent interactions in a hospital facility a COVID-19 send out test was performed.  Explained to the patient this would not be back  for 2 days.  Angela [name] was evaluated in Emergency Department on 06/08/2018 for the symptoms described in the history of present illness. She was evaluated in the context of the global COVID-19 pandemic, which necessitated consideration that the patient might be at risk for infection with the SARS-CoV-2 virus that causes COVID-19. Institutional protocols and algorithms that pertain to the evaluation of patients at risk for COVID-19 are in a state of rapid change based on information released by regulatory bodies including the CDC and federal and state organizations. These policies and algorithms were followed during the patient's care in the ED.   As part of my medical decision making, I reviewed the following data within the [facility] notes reviewed and incorporated, Labs reviewed see above, Old chart reviewed, Radiograph reviewed see above, Notes from prior ED visits and [place] Controlled Substance Database  ____________________________________________   FINAL CLINICAL IMPRESSION(S) / ED DIAGNOSES  Final diagnoses:  Sprain of left ankle, unspecified ligament, initial encounter  Acute URI      NEW MEDICATIONS STARTED DURING THIS VISIT:  New Prescriptions   AZITHROMYCIN (ZITHROMAX Z-PAK) 250 MG TABLET    2 pills today then 1 pill a day for 4  days   FLUCONAZOLE (DIFLUCAN) 150 MG TABLET    Take one now and one in a week     Note:  This document was prepared using Dragon voice recognition software and may include unintentional dictation errors.    Versie Starks, PA-C 06/08/18 1511    Earleen Newport, MD 06/09/18 [phone]

## 2018-06-10 LAB — NOVEL CORONAVIRUS, NAA (HOSP ORDER, SEND-OUT TO REF LAB; TAT 18-24 HRS): SARS-CoV-2, NAA: NOT DETECTED

## 2018-06-10 NOTE — Telephone Encounter (Signed)
We can make referral hiatal hernia surgery was Dr. Cammie Sickle at [facility].  [phone]) [phone].  For leg swelling: Either compression hose Or stop norvasc, change to losartan 50 daily with compression hose

## 2018-06-11 ENCOUNTER — Ambulatory Visit: Payer: Self-pay | Admitting: Nurse Practitioner

## 2018-06-12 ENCOUNTER — Telehealth: Payer: Self-pay | Admitting: Emergency Medicine

## 2018-06-12 NOTE — Telephone Encounter (Signed)
Called to give result of covid test which is negative.  No answer and no voicemail.

## 2018-06-13 ENCOUNTER — Telehealth: Payer: Self-pay | Admitting: Internal Medicine

## 2018-06-13 NOTE — Telephone Encounter (Signed)
I spoke to patient. R/S patient to 07/01/18 with Dr. Alva Garnet. Nothing further needed at this time.

## 2018-06-14 ENCOUNTER — Telehealth: Payer: Self-pay

## 2018-06-14 ENCOUNTER — Institutional Professional Consult (permissible substitution): Payer: Medicare Other | Admitting: Internal Medicine

## 2018-06-14 NOTE — Telephone Encounter (Signed)
Documentation reviewed 

## 2018-06-14 NOTE — Telephone Encounter (Signed)
Copied from [place] [id number]. Topic: General - Other >> Jun 14, 2018  9:49 AM Yvette Rack wrote: Reason for CRM: Almyra Free with [facility] stated patient is being discharged from skilled nursing. Cb# [phone]

## 2018-06-18 DIAGNOSIS — M722 Plantar fascial fibromatosis: Secondary | ICD-10-CM | POA: Diagnosis not present

## 2018-06-18 DIAGNOSIS — I051 Rheumatic mitral insufficiency: Secondary | ICD-10-CM | POA: Diagnosis not present

## 2018-06-18 DIAGNOSIS — G43911 Migraine, unspecified, intractable, with status migrainosus: Secondary | ICD-10-CM | POA: Diagnosis not present

## 2018-06-18 DIAGNOSIS — M4712 Other spondylosis with myelopathy, cervical region: Secondary | ICD-10-CM | POA: Diagnosis not present

## 2018-06-18 DIAGNOSIS — G629 Polyneuropathy, unspecified: Secondary | ICD-10-CM | POA: Diagnosis not present

## 2018-06-18 DIAGNOSIS — I10 Essential (primary) hypertension: Secondary | ICD-10-CM | POA: Diagnosis not present

## 2018-06-18 DIAGNOSIS — M50122 Cervical disc disorder at C5-C6 level with radiculopathy: Secondary | ICD-10-CM | POA: Diagnosis not present

## 2018-06-18 DIAGNOSIS — M19049 Primary osteoarthritis, unspecified hand: Secondary | ICD-10-CM | POA: Diagnosis not present

## 2018-06-18 DIAGNOSIS — J441 Chronic obstructive pulmonary disease with (acute) exacerbation: Secondary | ICD-10-CM | POA: Diagnosis not present

## 2018-06-18 DIAGNOSIS — M791 Myalgia, unspecified site: Secondary | ICD-10-CM | POA: Diagnosis not present

## 2018-06-18 DIAGNOSIS — G5603 Carpal tunnel syndrome, bilateral upper limbs: Secondary | ICD-10-CM | POA: Diagnosis not present

## 2018-06-19 MED ORDER — LOSARTAN POTASSIUM 50 MG PO TABS: 50.0000 mg | ORAL_TABLET | Freq: Every day | ORAL | 3 refills | Status: DC

## 2018-06-19 NOTE — Telephone Encounter (Signed)
Spoke with patient and provided her with name & number for referral as provided. She was agreeable with compression socks/hose and stopping the amlodipine (Norvasc). Sent in prescription for Losartan to pharmacy she requested.   She then wanted to know if I could send in [place]. It's too early because it was sent in 06/03/2018 for 3 month supply by PCP so l will route this message over to PCP for refills to be added.   Patient was also asking about some nutritional supplements such as Boost or Ensure with high protein along with depends or pullups which she states that are now covered by Medicaid. I advised that these type of orders are managed by PCP and require various forms to be done which we do not handle in a cardiology setting. I did inquire if she received any type of home health services and she stated that she did not. After further discussion she did report having [facility] and [facility] at some point in the past. Advised that I would send this request over to PCP office for review and assistance with those orders.

## 2018-06-19 NOTE — Telephone Encounter (Addendum)
Attempted to call [facility] for referral but they close at 4 pm. Entered into Epic. Called over to PCP office regarding other patient requests and will send message via Epic to assist with further orders.

## 2018-06-19 NOTE — Telephone Encounter (Signed)
Tried to call patient back with update but no answer and voicemail box is full.  Updates were: 1.  Vas[place]pa she had filled 06/03/18 with 3 month supply and will send note to PCP office for refills. 2. [facility] and they were closed. Referral order entered in Epic and direct number to clinic is [phone] with Dr. Cammie Sickle 3. Request for Boost, Ensure, with high protein along with depends or pullups and possible home health request sent over to PCP office for further orders from them.

## 2018-06-19 NOTE — Addendum Note (Signed)
Addended by: Valora Corporal on: 06/19/2018 05:38 PM   Modules accepted: Orders

## 2018-06-20 ENCOUNTER — Other Ambulatory Visit: Payer: Medicare Other

## 2018-06-20 ENCOUNTER — Telehealth: Payer: Self-pay | Admitting: Family Medicine

## 2018-06-20 DIAGNOSIS — I10 Essential (primary) hypertension: Secondary | ICD-10-CM | POA: Diagnosis not present

## 2018-06-20 DIAGNOSIS — G629 Polyneuropathy, unspecified: Secondary | ICD-10-CM | POA: Diagnosis not present

## 2018-06-20 DIAGNOSIS — G5603 Carpal tunnel syndrome, bilateral upper limbs: Secondary | ICD-10-CM | POA: Diagnosis not present

## 2018-06-20 DIAGNOSIS — M4712 Other spondylosis with myelopathy, cervical region: Secondary | ICD-10-CM | POA: Diagnosis not present

## 2018-06-20 DIAGNOSIS — M722 Plantar fascial fibromatosis: Secondary | ICD-10-CM | POA: Diagnosis not present

## 2018-06-20 DIAGNOSIS — J441 Chronic obstructive pulmonary disease with (acute) exacerbation: Secondary | ICD-10-CM | POA: Diagnosis not present

## 2018-06-20 DIAGNOSIS — M50122 Cervical disc disorder at C5-C6 level with radiculopathy: Secondary | ICD-10-CM | POA: Diagnosis not present

## 2018-06-20 DIAGNOSIS — G43911 Migraine, unspecified, intractable, with status migrainosus: Secondary | ICD-10-CM | POA: Diagnosis not present

## 2018-06-20 DIAGNOSIS — M791 Myalgia, unspecified site: Secondary | ICD-10-CM | POA: Diagnosis not present

## 2018-06-20 DIAGNOSIS — I051 Rheumatic mitral insufficiency: Secondary | ICD-10-CM | POA: Diagnosis not present

## 2018-06-20 DIAGNOSIS — M19049 Primary osteoarthritis, unspecified hand: Secondary | ICD-10-CM | POA: Diagnosis not present

## 2018-06-20 NOTE — Telephone Encounter (Signed)
She canceled appt on 5/26, and states she was leaving the practice as of 5/18 when I provided 3 month supply of medications previously prescribed by Dr. Sanda Klein.  If she feels she is in need of the additional listed supplies, she will need to attend a follow up visit with myself or Benjamine Mola.

## 2018-06-20 NOTE — Telephone Encounter (Signed)
Copied from [place] [id number]. Topic: Quick Communication - Home Health Verbal Orders >> Jun 20, 2018  2:11 PM Rutherford Nail, [place] wrote: Caller/Agency: Jeneen Rinks, PT - [facility] Callback Number: [phone] Requesting OT/PT/Skilled Nursing/Social Work/Speech Therapy: Physical Therapy Frequency:   Calling to report a missed physical therapy visit that was scheduled for 2pm today. States that he knocked on the door, used the doorbell, and called patient with no answer to any of those.

## 2018-06-21 ENCOUNTER — Telehealth: Payer: Self-pay | Admitting: Cardiovascular Disease

## 2018-06-21 ENCOUNTER — Telehealth: Payer: Self-pay | Admitting: Family Medicine

## 2018-06-21 NOTE — Telephone Encounter (Signed)
Please call regarding testing that patient needs to have done.

## 2018-06-21 NOTE — Telephone Encounter (Signed)
Spoke with Almyra Free at [facility] regarding referral placed in system. She reports that patient will need several tests prior to surgery and wanted to send [place] the orders to have that done. Advised that we have not seen her recently and that we do not order GI testing. She states that she will reach out to patient but that if I reach her to please provide her with number to call and they will get things set up for her and assist with any procedures needed prior to appointment.

## 2018-06-21 NOTE — Telephone Encounter (Signed)
Spoke with patient and reviewed that they called regarding referral in system. Provided her the number to Almyra Free RN for Dr. Margart Sickles office in order to coordinate other testing that is needed. She verbalized understanding with no further questions at this time.

## 2018-06-21 NOTE — Telephone Encounter (Signed)
Copied from [place] [id number]. Topic: Quick Communication - Home Health Verbal Orders >> Jun 21, 2018 11:48 AM Jodie Echevaria wrote: Caller/Agency: [name] / [place] Number: [phone] ok to LM Requesting OT/PT/Skilled Nursing/Social Work/Speech Therapy: OT Frequency: 1 x 1 then 2 xs 1

## 2018-06-21 NOTE — Telephone Encounter (Signed)
Call to patient x 2 and no answer or voicemail. Called [facility] for referral placed in Epic for Dr. Cammie Sickle for evaluation of Hiatal Hernia surgery. Spoke with Nira Conn there at [phone] and she requested that we fax over referral, demographics sheet, and they would then reach out to patient to schedule appointment. Will route this message via Epic Fax to [phone] and also attempt to reach patient again to review.

## 2018-06-21 NOTE — Telephone Encounter (Signed)
Spoke with patient and reviewed information requested. Referral placed in system and another department at [facility] called and received request. (See other phone note) Provided patient with information on medication, referral, and that she would need to follow up with her primary care office for the other supplies. She verbalized understanding of our conversation, agreement with plan, and had no further questions at this time.

## 2018-06-21 NOTE — Telephone Encounter (Signed)
Verbal given for OT

## 2018-06-21 NOTE — Telephone Encounter (Signed)
No answer/No voicemail x 2

## 2018-06-24 ENCOUNTER — Telehealth: Payer: Self-pay | Admitting: Family Medicine

## 2018-06-24 DIAGNOSIS — M4712 Other spondylosis with myelopathy, cervical region: Secondary | ICD-10-CM | POA: Diagnosis not present

## 2018-06-24 DIAGNOSIS — G5603 Carpal tunnel syndrome, bilateral upper limbs: Secondary | ICD-10-CM | POA: Diagnosis not present

## 2018-06-24 DIAGNOSIS — I10 Essential (primary) hypertension: Secondary | ICD-10-CM | POA: Diagnosis not present

## 2018-06-24 DIAGNOSIS — M25572 Pain in left ankle and joints of left foot: Secondary | ICD-10-CM | POA: Diagnosis not present

## 2018-06-24 DIAGNOSIS — M19071 Primary osteoarthritis, right ankle and foot: Secondary | ICD-10-CM | POA: Diagnosis not present

## 2018-06-24 DIAGNOSIS — M50122 Cervical disc disorder at C5-C6 level with radiculopathy: Secondary | ICD-10-CM | POA: Diagnosis not present

## 2018-06-24 DIAGNOSIS — M19049 Primary osteoarthritis, unspecified hand: Secondary | ICD-10-CM | POA: Diagnosis not present

## 2018-06-24 DIAGNOSIS — G629 Polyneuropathy, unspecified: Secondary | ICD-10-CM | POA: Diagnosis not present

## 2018-06-24 DIAGNOSIS — S92132A Displaced fracture of posterior process of left talus, initial encounter for closed fracture: Secondary | ICD-10-CM | POA: Diagnosis not present

## 2018-06-24 DIAGNOSIS — M25571 Pain in right ankle and joints of right foot: Secondary | ICD-10-CM | POA: Diagnosis not present

## 2018-06-24 DIAGNOSIS — I051 Rheumatic mitral insufficiency: Secondary | ICD-10-CM | POA: Diagnosis not present

## 2018-06-24 DIAGNOSIS — M722 Plantar fascial fibromatosis: Secondary | ICD-10-CM | POA: Diagnosis not present

## 2018-06-24 DIAGNOSIS — J441 Chronic obstructive pulmonary disease with (acute) exacerbation: Secondary | ICD-10-CM | POA: Diagnosis not present

## 2018-06-24 DIAGNOSIS — M791 Myalgia, unspecified site: Secondary | ICD-10-CM | POA: Diagnosis not present

## 2018-06-24 DIAGNOSIS — G43911 Migraine, unspecified, intractable, with status migrainosus: Secondary | ICD-10-CM | POA: Diagnosis not present

## 2018-06-24 NOTE — Telephone Encounter (Signed)
Copied from [place] [id number]. Topic: Quick Communication - Home Health Verbal Orders >> Jun 24, 2018  9:35 AM Leward Quan A wrote: Caller/Agency: [facility] Number: [phone] ok to LM Requesting OT/PT/Skilled Nursing/Social Work/Speech Therapy: Physical Therapy Frequency: 2 week for 3 weeks  Home health assist for grooming and bathing

## 2018-06-24 NOTE — Telephone Encounter (Signed)
Verbal order given to [facility] with [facility].

## 2018-06-24 NOTE — Telephone Encounter (Signed)
Spoke with Benjamine Mola NP and she provided a Museum/gallery conservator. Verbal given to Colletta Maryland with [facility] for this request as well as the OT request.

## 2018-06-26 DIAGNOSIS — G5603 Carpal tunnel syndrome, bilateral upper limbs: Secondary | ICD-10-CM | POA: Diagnosis not present

## 2018-06-26 DIAGNOSIS — G43911 Migraine, unspecified, intractable, with status migrainosus: Secondary | ICD-10-CM | POA: Diagnosis not present

## 2018-06-26 DIAGNOSIS — M19049 Primary osteoarthritis, unspecified hand: Secondary | ICD-10-CM | POA: Diagnosis not present

## 2018-06-26 DIAGNOSIS — J441 Chronic obstructive pulmonary disease with (acute) exacerbation: Secondary | ICD-10-CM | POA: Diagnosis not present

## 2018-06-26 DIAGNOSIS — M722 Plantar fascial fibromatosis: Secondary | ICD-10-CM | POA: Diagnosis not present

## 2018-06-26 DIAGNOSIS — M50122 Cervical disc disorder at C5-C6 level with radiculopathy: Secondary | ICD-10-CM | POA: Diagnosis not present

## 2018-06-26 DIAGNOSIS — G629 Polyneuropathy, unspecified: Secondary | ICD-10-CM | POA: Diagnosis not present

## 2018-06-26 DIAGNOSIS — M4712 Other spondylosis with myelopathy, cervical region: Secondary | ICD-10-CM | POA: Diagnosis not present

## 2018-06-26 DIAGNOSIS — H90A31 Mixed conductive and sensorineural hearing loss, unilateral, right ear with restricted hearing on the contralateral side: Secondary | ICD-10-CM | POA: Diagnosis not present

## 2018-06-26 DIAGNOSIS — I051 Rheumatic mitral insufficiency: Secondary | ICD-10-CM | POA: Diagnosis not present

## 2018-06-26 DIAGNOSIS — I10 Essential (primary) hypertension: Secondary | ICD-10-CM | POA: Diagnosis not present

## 2018-06-26 DIAGNOSIS — M791 Myalgia, unspecified site: Secondary | ICD-10-CM | POA: Diagnosis not present

## 2018-06-27 DIAGNOSIS — M791 Myalgia, unspecified site: Secondary | ICD-10-CM | POA: Diagnosis not present

## 2018-06-27 DIAGNOSIS — I051 Rheumatic mitral insufficiency: Secondary | ICD-10-CM | POA: Diagnosis not present

## 2018-06-27 DIAGNOSIS — G5603 Carpal tunnel syndrome, bilateral upper limbs: Secondary | ICD-10-CM | POA: Diagnosis not present

## 2018-06-27 DIAGNOSIS — G629 Polyneuropathy, unspecified: Secondary | ICD-10-CM | POA: Diagnosis not present

## 2018-06-27 DIAGNOSIS — M50122 Cervical disc disorder at C5-C6 level with radiculopathy: Secondary | ICD-10-CM | POA: Diagnosis not present

## 2018-06-27 DIAGNOSIS — J441 Chronic obstructive pulmonary disease with (acute) exacerbation: Secondary | ICD-10-CM | POA: Diagnosis not present

## 2018-06-27 DIAGNOSIS — M19049 Primary osteoarthritis, unspecified hand: Secondary | ICD-10-CM | POA: Diagnosis not present

## 2018-06-27 DIAGNOSIS — M4712 Other spondylosis with myelopathy, cervical region: Secondary | ICD-10-CM | POA: Diagnosis not present

## 2018-06-27 DIAGNOSIS — M722 Plantar fascial fibromatosis: Secondary | ICD-10-CM | POA: Diagnosis not present

## 2018-06-27 DIAGNOSIS — I10 Essential (primary) hypertension: Secondary | ICD-10-CM | POA: Diagnosis not present

## 2018-06-27 DIAGNOSIS — G43911 Migraine, unspecified, intractable, with status migrainosus: Secondary | ICD-10-CM | POA: Diagnosis not present

## 2018-06-29 DIAGNOSIS — I051 Rheumatic mitral insufficiency: Secondary | ICD-10-CM | POA: Diagnosis not present

## 2018-06-29 DIAGNOSIS — M722 Plantar fascial fibromatosis: Secondary | ICD-10-CM | POA: Diagnosis not present

## 2018-06-29 DIAGNOSIS — M791 Myalgia, unspecified site: Secondary | ICD-10-CM | POA: Diagnosis not present

## 2018-06-29 DIAGNOSIS — G5603 Carpal tunnel syndrome, bilateral upper limbs: Secondary | ICD-10-CM | POA: Diagnosis not present

## 2018-06-29 DIAGNOSIS — G43911 Migraine, unspecified, intractable, with status migrainosus: Secondary | ICD-10-CM | POA: Diagnosis not present

## 2018-06-29 DIAGNOSIS — M50122 Cervical disc disorder at C5-C6 level with radiculopathy: Secondary | ICD-10-CM | POA: Diagnosis not present

## 2018-06-29 DIAGNOSIS — G629 Polyneuropathy, unspecified: Secondary | ICD-10-CM | POA: Diagnosis not present

## 2018-06-29 DIAGNOSIS — I10 Essential (primary) hypertension: Secondary | ICD-10-CM | POA: Diagnosis not present

## 2018-06-29 DIAGNOSIS — M19049 Primary osteoarthritis, unspecified hand: Secondary | ICD-10-CM | POA: Diagnosis not present

## 2018-06-29 DIAGNOSIS — M4712 Other spondylosis with myelopathy, cervical region: Secondary | ICD-10-CM | POA: Diagnosis not present

## 2018-06-29 DIAGNOSIS — J441 Chronic obstructive pulmonary disease with (acute) exacerbation: Secondary | ICD-10-CM | POA: Diagnosis not present

## 2018-07-01 ENCOUNTER — Other Ambulatory Visit: Payer: Self-pay

## 2018-07-01 ENCOUNTER — Encounter: Payer: Self-pay | Admitting: Pulmonary Disease

## 2018-07-01 ENCOUNTER — Ambulatory Visit (INDEPENDENT_AMBULATORY_CARE_PROVIDER_SITE_OTHER): Payer: Medicare Other | Admitting: Pulmonary Disease

## 2018-07-01 ENCOUNTER — Telehealth: Payer: Self-pay | Admitting: Pulmonary Disease

## 2018-07-01 VITALS — BP 142/90 | HR 61 | Temp 98.1°F | Ht 63.0 in | Wt 142.4 lb

## 2018-07-01 DIAGNOSIS — G4710 Hypersomnia, unspecified: Secondary | ICD-10-CM

## 2018-07-01 DIAGNOSIS — Z87891 Personal history of nicotine dependence: Secondary | ICD-10-CM | POA: Diagnosis not present

## 2018-07-01 DIAGNOSIS — R0609 Other forms of dyspnea: Secondary | ICD-10-CM

## 2018-07-01 DIAGNOSIS — J398 Other specified diseases of upper respiratory tract: Secondary | ICD-10-CM

## 2018-07-01 DIAGNOSIS — M722 Plantar fascial fibromatosis: Secondary | ICD-10-CM | POA: Diagnosis not present

## 2018-07-01 DIAGNOSIS — I10 Essential (primary) hypertension: Secondary | ICD-10-CM | POA: Diagnosis not present

## 2018-07-01 DIAGNOSIS — G4719 Other hypersomnia: Secondary | ICD-10-CM

## 2018-07-01 DIAGNOSIS — G629 Polyneuropathy, unspecified: Secondary | ICD-10-CM | POA: Diagnosis not present

## 2018-07-01 DIAGNOSIS — G5603 Carpal tunnel syndrome, bilateral upper limbs: Secondary | ICD-10-CM | POA: Diagnosis not present

## 2018-07-01 DIAGNOSIS — M791 Myalgia, unspecified site: Secondary | ICD-10-CM | POA: Diagnosis not present

## 2018-07-01 DIAGNOSIS — J441 Chronic obstructive pulmonary disease with (acute) exacerbation: Secondary | ICD-10-CM | POA: Diagnosis not present

## 2018-07-01 DIAGNOSIS — R0683 Snoring: Secondary | ICD-10-CM

## 2018-07-01 DIAGNOSIS — I051 Rheumatic mitral insufficiency: Secondary | ICD-10-CM | POA: Diagnosis not present

## 2018-07-01 DIAGNOSIS — J410 Simple chronic bronchitis: Secondary | ICD-10-CM

## 2018-07-01 DIAGNOSIS — G43911 Migraine, unspecified, intractable, with status migrainosus: Secondary | ICD-10-CM | POA: Diagnosis not present

## 2018-07-01 DIAGNOSIS — M4712 Other spondylosis with myelopathy, cervical region: Secondary | ICD-10-CM | POA: Diagnosis not present

## 2018-07-01 DIAGNOSIS — M19049 Primary osteoarthritis, unspecified hand: Secondary | ICD-10-CM | POA: Diagnosis not present

## 2018-07-01 DIAGNOSIS — M50122 Cervical disc disorder at C5-C6 level with radiculopathy: Secondary | ICD-10-CM | POA: Diagnosis not present

## 2018-07-01 MED ORDER — FLUTTER DEVI: 1.0000 | Freq: Every day | 0 refills | Status: DC

## 2018-07-01 MED ORDER — INCRUSE ELLIPTA 62.5 MCG/INH IN AEPB: 1.0000 | INHALATION_SPRAY | Freq: Every day | RESPIRATORY_TRACT | 5 refills | Status: DC

## 2018-07-01 MED ORDER — AZITHROMYCIN 250 MG PO TABS: 250.0000 mg | ORAL_TABLET | ORAL | 5 refills | Status: DC

## 2018-07-01 NOTE — Telephone Encounter (Signed)
Per DS okay to use all medications listed above- DO NOT PUT VAPOR RUB IN OR UNDER NOSE .  Pt is aware and voiced her understanding.  Nothing further is needed.

## 2018-07-01 NOTE — Patient Instructions (Addendum)
Discontinue Anoro inhaler Begin Incruse inhaler, 1 inhalation daily Initiate azithromycin 250 mg every Monday, Wednesday, Friday Continue levalbuterol (Xopenex) nebulizer as needed Continue pro-air inhaler as needed Prescription entered for Flutter Valve - use 2 or 3 times per day to help mobilize airway secretions Sleep study ordered

## 2018-07-02 NOTE — Telephone Encounter (Signed)
Colletta Maryland calling with [facility] called to inform us that the patient had a missed visit on 06/28/18. [place]   [phone]

## 2018-07-03 DIAGNOSIS — G629 Polyneuropathy, unspecified: Secondary | ICD-10-CM | POA: Diagnosis not present

## 2018-07-03 DIAGNOSIS — M791 Myalgia, unspecified site: Secondary | ICD-10-CM | POA: Diagnosis not present

## 2018-07-03 DIAGNOSIS — M19049 Primary osteoarthritis, unspecified hand: Secondary | ICD-10-CM | POA: Diagnosis not present

## 2018-07-03 DIAGNOSIS — J441 Chronic obstructive pulmonary disease with (acute) exacerbation: Secondary | ICD-10-CM | POA: Diagnosis not present

## 2018-07-03 DIAGNOSIS — G43911 Migraine, unspecified, intractable, with status migrainosus: Secondary | ICD-10-CM | POA: Diagnosis not present

## 2018-07-03 DIAGNOSIS — M722 Plantar fascial fibromatosis: Secondary | ICD-10-CM | POA: Diagnosis not present

## 2018-07-03 DIAGNOSIS — M50122 Cervical disc disorder at C5-C6 level with radiculopathy: Secondary | ICD-10-CM | POA: Diagnosis not present

## 2018-07-03 DIAGNOSIS — G5603 Carpal tunnel syndrome, bilateral upper limbs: Secondary | ICD-10-CM | POA: Diagnosis not present

## 2018-07-03 DIAGNOSIS — I051 Rheumatic mitral insufficiency: Secondary | ICD-10-CM | POA: Diagnosis not present

## 2018-07-03 DIAGNOSIS — I10 Essential (primary) hypertension: Secondary | ICD-10-CM | POA: Diagnosis not present

## 2018-07-03 DIAGNOSIS — M4712 Other spondylosis with myelopathy, cervical region: Secondary | ICD-10-CM | POA: Diagnosis not present

## 2018-07-03 NOTE — Progress Notes (Signed)
PULMONARY CONSULT NOTE  Requesting MD/Service: Lada Date of initial consultation: 07/01/18 Reason for consultation: Former smoker, chronic bronchitis  PT PROFILE: 68 y.o. female former smoker (2 PPD x11 years, quit age 48) previously followed by Dr. Raul Del with persistent symptoms of chronic bronchitis.  DATA: 11/25/16 CTA chest: No evidence of pulmonary embolism.  Bibasilar atelectasis noted.  Otherwise lungs are clear.  Large hiatal hernia noted. 05/14/17 PFTs: FVC: 3.14 L (113 %pred), FEV1: 2.49 L (117 %pred), FEV1/FVC: 79.5%, TLC: 4.43 L (90.4 %pred), DLCO 87.9 %pred   INTERVAL:  HPI:  She is a somewhat difficult historian with frequent tangential answers.  She reports symptoms of shortness of breath and chronic cough or sputum production since approximately 1978.  That is the year that she quit smoking.  For a long period of time her symptoms improved.  However, she has had relapsing symptoms of chronic bronchitis with cough and sputum production since 1990.  She also reports progressive chest tightness, shortness of breath and mucus production.  The symptoms are particularly worse in the past 6 months.  She has been treated with multiple different inhalers over the past many years with modest benefit at most.  She also uses nebulized levalbuterol with some improvement.  Her current medical regimen includes Anoro inhaler which she only uses 3-4 times per week.  She is on cetirizine and Flonase.  She uses nebulized levalbuterol 0-1 time per week.  She has a long and complicated past medical history which includes multiple psychiatric/psychological diagnoses.  She has a multitude of medication intolerances including adverse reactions to beta agonists and corticosteroids (this includes inhaled corticosteroids).  Her smoking history as documented above.  She has no history of significant occupational exposures.  She was previously employed as an Therapist, sports and in Programmer, systems.  She notes that she  is recently moved into an apartment building that is often damp and smells strongly of cigarette smoke.  She has lived in [place] in [place].  She finds that her symptoms are worse in the [facility].  She denies orthopnea but sleeps with her head of bed elevated due to hiatal hernia.  She denies paroxysmal nocturnal dyspnea, lower extremity edema and calf tenderness.  She has symptoms of reflux/heartburn.  She denies dysphagia and chest pain.  There is no unexplained weight loss or weight gain.  She denies fever, purulent sputum, hemoptysis, LE edema and calf tenderness.  As an after thought, she reports that she snores and suffers from moderate daytime hypersomnolence.  She requests a sleep study to evaluate for possible obstructive sleep apnea.  Past Medical History:  Diagnosis Date  . Allergic rhinitis   . Anemia   . Blurred vision   . Depression   . Diverticulosis   . Endometriosis   . Falls   . GERD (gastroesophageal reflux disease)   . Hernia, hiatal   . Hypothyroidism   . IBS (irritable bowel syndrome)   . Malignant neoplasm of skin   . Migraine   . Neuropathy   . PTSD (post-traumatic stress disorder)   . Thyroid disease     Past Surgical History:  Procedure Laterality Date  . APPENDECTOMY    . CHOLECYSTECTOMY    . CHOLECYSTECTOMY, LAPAROSCOPIC    . ESOPHAGOGASTRODUODENOSCOPY (EGD) WITH PROPOFOL N/A 03/29/2015   Procedure: ESOPHAGOGASTRODUODENOSCOPY (EGD) WITH PROPOFOL;  Surgeon: Josefine Class, MD;  Location[facility] ENDOSCOPY;  Service: Endoscopy;  Laterality: N/A;  . HERNIA REPAIR    . laproscopy    .  TONSILLECTOMY    . uteral suspension      MEDICATIONS: I have reviewed all medications and confirmed regimen as documented  Social History   Socioeconomic History  . Marital status: Single    Spouse name: Not on file  . Number of children: 0  . Years of education: 1.5 years of college  . Highest education level: Some college, no  degree  Occupational History  . Occupation: Retired  Scientific laboratory technician  . Financial resource strain: Very hard  . Food insecurity    Worry: Often true    Inability: Often true  . Transportation needs    Medical: Yes    Non-medical: Yes  Tobacco Use  . Smoking status: Former Smoker    Quit date: 11/17/1976    Years since quitting: 41.6  . Smokeless tobacco: Never Used  Substance and Sexual Activity  . Alcohol use: No  . Drug use: No  . Sexual activity: Not Currently  Lifestyle  . Physical activity    Days per week: 0 days    Minutes per session: 0 min  . Stress: Very much  Relationships  . Social Herbalist on phone: Never    Gets together: Never    Attends religious service: More than 4 times per year    Active member of club or organization: No    Attends meetings of clubs or organizations: Never    Relationship status: Not on file  . Intimate partner violence    Fear of current or ex partner: No    Emotionally abused: No    Physically abused: No    Forced sexual activity: No  Other Topics Concern  . Not on file  Social History Narrative   Lives at home alone.   Right-handed.   No daily caffeine use.    Family History  Problem Relation Age of Onset  . Heart disease Father   . Hearing loss Father   . COPD Father   . Depression Father   . Stroke Father   . Vision loss Father   . Varicose Veins Father   . Cancer Mother        lung  . Depression Sister   . Arthritis Sister   . Arthritis Brother   . Hernia Brother   . Anxiety disorder Brother   . Arthritis Brother   . Hernia Brother   . Anxiety disorder Brother   . Urolithiasis Neg Hx   . Kidney disease Neg Hx   . Kidney cancer Neg Hx   . Prostate cancer Neg Hx     ROS: No fever, myalgias/arthralgias, unexplained weight loss or weight gain No new focal weakness or sensory deficits No otalgia, hearing loss, visual changes, nasal and sinus symptoms, mouth and throat problems No neck pain or  adenopathy No abdominal pain, N/V/D, diarrhea, change in bowel pattern No dysuria, change in urinary pattern   Vitals:   07/01/18 1202  BP: (!) 142/90  Pulse: 61  Temp: 98.1 F (36.7 C)  TempSrc: Oral  SpO2: 97%  Weight: 142 lb 6.4 oz (64.6 kg)  Height: 5\' 3"  (1.6 m)     EXAM:  Gen: WDWN, somewhat nervous appearing and fidgety, no overt respiratory distress HEENT: NCAT, sclerae white, oropharynx normal Neck: Supple without LAN, thyromegaly, JVD Lungs: breath sounds full without wheezes or other adventitious sounds.  Percussion note is normal throughout Cardiovascular: RRR, no murmurs noted Abdomen: Soft, nontender, normal BS Ext: without clubbing, cyanosis.  She has  a RLE splint boot on and a left ankle brace Neuro: CNs grossly intact, motor and sensory intact Skin: Limited exam, no lesions noted  DATA:   BMP Latest Ref Rng & Units 06/08/2018 04/19/2018 02/26/2018  Glucose 70 - 99 mg/dL 77 95 84  BUN 8 - 23 mg/dL 12 15 17   Creatinine 0.44 - 1.00 mg/dL 0.66 0.60 0.93  BUN/Creat Ratio 6 - 22 (calc) - - NOT APPLICABLE  Sodium 836 - 145 mmol/L 134(L) 138 138  Potassium 3.5 - 5.1 mmol/L 3.6 3.9 4.8  Chloride 98 - 111 mmol/L 98 102 103  CO2 22 - 32 mmol/L 27 26 27   Calcium 8.9 - 10.3 mg/dL 9.1 9.2 9.8    CBC Latest Ref Rng & Units 06/08/2018 04/19/2018 02/26/2018  WBC 4.0 - 10.5 K/uL 5.4 4.6 4.8  Hemoglobin 12.0 - 15.0 g/dL 12.4 12.1 12.4  Hematocrit 36.0 - 46.0 % 36.4 36.7 36.5  Platelets 150 - 400 K/uL 272 277 309    CXR 06/08/2018: Hiatal hernia noted.  No evidence of pulmonary or cardiac disease  I have personally reviewed all chest radiographs reported above including CXRs and CT chest unless otherwise indicated  IMPRESSION:     ICD-10-CM   1. DOE (dyspnea on exertion)  R06.09   2. Simple chronic bronchitis (HCC)  J41.0   3. Chronic mucus hypersecretion, respiratory  J39.8   4. Former smoker  Z87.891   5. Snoring  R06.83 Split night study  6. Daytime hypersomnia   G47.19 Split night study   She has previously been given a diagnosis of COPD but recent PFTs demonstrate that she clearly DOES NOT have COPD (no obstruction on PFTs).  Therefore her diagnosis is simple chronic bronchitis with mucus hypersecretion in a former smoker. This could be in part due to chronic microaspiration related to her hiatal hernia (which is soon to be evaluated at [facility]).   Exertional dyspnea appears to be out of proportion to objective findings as there is no abnormality on PFTs and no evidence of cardiac disease to account for her level of DOE.  She has been tried on a multitude of different inhaler and pulmonary medications over the years with only modest improvement in her symptoms at best. This is probably because there is no significant airflow obstruction to be reversed with bronchodilator therapy.  I would like to give her an inhaled steroid to reduce airway inflammation but she has been intolerant of these medications in the past.  Therefore, we will try scheduled long-term azithromycin in hopes of taking advantage of its anti-inflammatory effect.  She does not use the Anoro inhaler consistently because of perceived adverse effects due to the LABA (heart palpitations, tachycardia). The LAMA/anticholinergic component might be beneficial in reducing the volume of airway secretions. In addition, mucus clearance techniques might potentially benefit her.   PLAN:  1) discontinue Anoro inhaler (LABA/LAMA) 2) begin Incruse inhaler (LAMA), 1 inhalation daily 3) initiate azithromycin 250 mg to be taken every M, W, F 4) continue levalbuterol nebulizer or pro-air inhaler as needed for increased shortness of breath, wheezing, chest tightness, cough 5) I have provided a prescription for a flutter valve and instructed her to use it 2-3 times per day to help mobilize airway secretions 6) for symptoms of daytime hypersomnolence, a sleep study has been ordered at her request. 7) encouraged  that she pursue evaluation and management of hiatal hernia  Follow-up in 6 weeks.  Call sooner if needed  Merton Border, MD PCCM service  Mobile [phone] Pager [phone] 07/03/2018 1:52 PM

## 2018-07-05 ENCOUNTER — Ambulatory Visit
Admission: RE | Admit: 2018-07-05 | Discharge: 2018-07-05 | Disposition: A | Discharge status: Home / Self Care | Payer: Medicare Other | Source: Ambulatory Visit | Attending: Family Medicine | Admitting: Family Medicine

## 2018-07-05 ENCOUNTER — Other Ambulatory Visit: Payer: Self-pay

## 2018-07-05 ENCOUNTER — Other Ambulatory Visit: Payer: Self-pay | Admitting: Family Medicine

## 2018-07-05 DIAGNOSIS — M85852 Other specified disorders of bone density and structure, left thigh: Secondary | ICD-10-CM | POA: Diagnosis not present

## 2018-07-05 DIAGNOSIS — Z1231 Encounter for screening mammogram for malignant neoplasm of breast: Secondary | ICD-10-CM | POA: Diagnosis not present

## 2018-07-05 DIAGNOSIS — Z780 Asymptomatic menopausal state: Secondary | ICD-10-CM | POA: Diagnosis not present

## 2018-07-05 NOTE — Telephone Encounter (Signed)
[name] with [facility] calling to report that pt missed her OT visit for today. Colletta Maryland can be reached at [phone].

## 2018-07-06 DIAGNOSIS — G629 Polyneuropathy, unspecified: Secondary | ICD-10-CM | POA: Diagnosis not present

## 2018-07-06 DIAGNOSIS — M50122 Cervical disc disorder at C5-C6 level with radiculopathy: Secondary | ICD-10-CM | POA: Diagnosis not present

## 2018-07-06 DIAGNOSIS — I051 Rheumatic mitral insufficiency: Secondary | ICD-10-CM | POA: Diagnosis not present

## 2018-07-06 DIAGNOSIS — I10 Essential (primary) hypertension: Secondary | ICD-10-CM | POA: Diagnosis not present

## 2018-07-06 DIAGNOSIS — M19049 Primary osteoarthritis, unspecified hand: Secondary | ICD-10-CM | POA: Diagnosis not present

## 2018-07-06 DIAGNOSIS — M791 Myalgia, unspecified site: Secondary | ICD-10-CM | POA: Diagnosis not present

## 2018-07-06 DIAGNOSIS — M722 Plantar fascial fibromatosis: Secondary | ICD-10-CM | POA: Diagnosis not present

## 2018-07-06 DIAGNOSIS — J441 Chronic obstructive pulmonary disease with (acute) exacerbation: Secondary | ICD-10-CM | POA: Diagnosis not present

## 2018-07-06 DIAGNOSIS — G5603 Carpal tunnel syndrome, bilateral upper limbs: Secondary | ICD-10-CM | POA: Diagnosis not present

## 2018-07-06 DIAGNOSIS — G43911 Migraine, unspecified, intractable, with status migrainosus: Secondary | ICD-10-CM | POA: Diagnosis not present

## 2018-07-06 DIAGNOSIS — M4712 Other spondylosis with myelopathy, cervical region: Secondary | ICD-10-CM | POA: Diagnosis not present

## 2018-07-08 DIAGNOSIS — G5603 Carpal tunnel syndrome, bilateral upper limbs: Secondary | ICD-10-CM | POA: Diagnosis not present

## 2018-07-08 DIAGNOSIS — I051 Rheumatic mitral insufficiency: Secondary | ICD-10-CM | POA: Diagnosis not present

## 2018-07-08 DIAGNOSIS — G629 Polyneuropathy, unspecified: Secondary | ICD-10-CM | POA: Diagnosis not present

## 2018-07-08 DIAGNOSIS — M50122 Cervical disc disorder at C5-C6 level with radiculopathy: Secondary | ICD-10-CM | POA: Diagnosis not present

## 2018-07-08 DIAGNOSIS — I10 Essential (primary) hypertension: Secondary | ICD-10-CM | POA: Diagnosis not present

## 2018-07-08 DIAGNOSIS — J441 Chronic obstructive pulmonary disease with (acute) exacerbation: Secondary | ICD-10-CM | POA: Diagnosis not present

## 2018-07-08 DIAGNOSIS — M19049 Primary osteoarthritis, unspecified hand: Secondary | ICD-10-CM | POA: Diagnosis not present

## 2018-07-08 DIAGNOSIS — G43911 Migraine, unspecified, intractable, with status migrainosus: Secondary | ICD-10-CM | POA: Diagnosis not present

## 2018-07-08 DIAGNOSIS — M4712 Other spondylosis with myelopathy, cervical region: Secondary | ICD-10-CM | POA: Diagnosis not present

## 2018-07-08 DIAGNOSIS — M791 Myalgia, unspecified site: Secondary | ICD-10-CM | POA: Diagnosis not present

## 2018-07-08 DIAGNOSIS — M722 Plantar fascial fibromatosis: Secondary | ICD-10-CM | POA: Diagnosis not present

## 2018-07-09 DIAGNOSIS — J441 Chronic obstructive pulmonary disease with (acute) exacerbation: Secondary | ICD-10-CM | POA: Diagnosis not present

## 2018-07-09 DIAGNOSIS — G629 Polyneuropathy, unspecified: Secondary | ICD-10-CM | POA: Diagnosis not present

## 2018-07-09 DIAGNOSIS — I051 Rheumatic mitral insufficiency: Secondary | ICD-10-CM | POA: Diagnosis not present

## 2018-07-09 DIAGNOSIS — M4712 Other spondylosis with myelopathy, cervical region: Secondary | ICD-10-CM | POA: Diagnosis not present

## 2018-07-09 DIAGNOSIS — I10 Essential (primary) hypertension: Secondary | ICD-10-CM | POA: Diagnosis not present

## 2018-07-09 DIAGNOSIS — G5603 Carpal tunnel syndrome, bilateral upper limbs: Secondary | ICD-10-CM | POA: Diagnosis not present

## 2018-07-09 DIAGNOSIS — M19049 Primary osteoarthritis, unspecified hand: Secondary | ICD-10-CM | POA: Diagnosis not present

## 2018-07-09 DIAGNOSIS — M722 Plantar fascial fibromatosis: Secondary | ICD-10-CM | POA: Diagnosis not present

## 2018-07-09 DIAGNOSIS — M50122 Cervical disc disorder at C5-C6 level with radiculopathy: Secondary | ICD-10-CM | POA: Diagnosis not present

## 2018-07-09 DIAGNOSIS — M791 Myalgia, unspecified site: Secondary | ICD-10-CM | POA: Diagnosis not present

## 2018-07-09 DIAGNOSIS — G43911 Migraine, unspecified, intractable, with status migrainosus: Secondary | ICD-10-CM | POA: Diagnosis not present

## 2018-07-10 DIAGNOSIS — G5603 Carpal tunnel syndrome, bilateral upper limbs: Secondary | ICD-10-CM | POA: Diagnosis not present

## 2018-07-10 DIAGNOSIS — M19049 Primary osteoarthritis, unspecified hand: Secondary | ICD-10-CM | POA: Diagnosis not present

## 2018-07-10 DIAGNOSIS — I051 Rheumatic mitral insufficiency: Secondary | ICD-10-CM | POA: Diagnosis not present

## 2018-07-10 DIAGNOSIS — G629 Polyneuropathy, unspecified: Secondary | ICD-10-CM | POA: Diagnosis not present

## 2018-07-10 DIAGNOSIS — M4712 Other spondylosis with myelopathy, cervical region: Secondary | ICD-10-CM | POA: Diagnosis not present

## 2018-07-10 DIAGNOSIS — M722 Plantar fascial fibromatosis: Secondary | ICD-10-CM | POA: Diagnosis not present

## 2018-07-10 DIAGNOSIS — I10 Essential (primary) hypertension: Secondary | ICD-10-CM | POA: Diagnosis not present

## 2018-07-10 DIAGNOSIS — M50122 Cervical disc disorder at C5-C6 level with radiculopathy: Secondary | ICD-10-CM | POA: Diagnosis not present

## 2018-07-10 DIAGNOSIS — J441 Chronic obstructive pulmonary disease with (acute) exacerbation: Secondary | ICD-10-CM | POA: Diagnosis not present

## 2018-07-10 DIAGNOSIS — M791 Myalgia, unspecified site: Secondary | ICD-10-CM | POA: Diagnosis not present

## 2018-07-10 DIAGNOSIS — G43911 Migraine, unspecified, intractable, with status migrainosus: Secondary | ICD-10-CM | POA: Diagnosis not present

## 2018-07-11 ENCOUNTER — Emergency Department
Admission: EM | Admit: 2018-07-11 | Discharge: 2018-07-11 | Disposition: A | Discharge status: Home / Self Care | Payer: Medicare Other | Attending: Emergency Medicine | Admitting: Emergency Medicine

## 2018-07-11 ENCOUNTER — Telehealth: Payer: Self-pay | Admitting: Cardiovascular Disease

## 2018-07-11 ENCOUNTER — Emergency Department: Payer: Medicare Other

## 2018-07-11 ENCOUNTER — Encounter: Payer: Self-pay | Admitting: Intensive Care

## 2018-07-11 ENCOUNTER — Other Ambulatory Visit: Payer: Self-pay

## 2018-07-11 DIAGNOSIS — R609 Edema, unspecified: Secondary | ICD-10-CM

## 2018-07-11 DIAGNOSIS — Z85828 Personal history of other malignant neoplasm of skin: Secondary | ICD-10-CM | POA: Insufficient documentation

## 2018-07-11 DIAGNOSIS — E039 Hypothyroidism, unspecified: Secondary | ICD-10-CM | POA: Diagnosis not present

## 2018-07-11 DIAGNOSIS — R0789 Other chest pain: Secondary | ICD-10-CM | POA: Insufficient documentation

## 2018-07-11 DIAGNOSIS — M7989 Other specified soft tissue disorders: Secondary | ICD-10-CM | POA: Diagnosis not present

## 2018-07-11 DIAGNOSIS — J439 Emphysema, unspecified: Secondary | ICD-10-CM | POA: Diagnosis not present

## 2018-07-11 DIAGNOSIS — Z87891 Personal history of nicotine dependence: Secondary | ICD-10-CM | POA: Diagnosis not present

## 2018-07-11 DIAGNOSIS — I10 Essential (primary) hypertension: Secondary | ICD-10-CM | POA: Insufficient documentation

## 2018-07-11 DIAGNOSIS — R079 Chest pain, unspecified: Secondary | ICD-10-CM | POA: Diagnosis not present

## 2018-07-11 DIAGNOSIS — R600 Localized edema: Secondary | ICD-10-CM | POA: Diagnosis not present

## 2018-07-11 DIAGNOSIS — R519 Headache, unspecified: Secondary | ICD-10-CM

## 2018-07-11 DIAGNOSIS — R2243 Localized swelling, mass and lump, lower limb, bilateral: Secondary | ICD-10-CM | POA: Diagnosis not present

## 2018-07-11 DIAGNOSIS — Z79899 Other long term (current) drug therapy: Secondary | ICD-10-CM | POA: Diagnosis not present

## 2018-07-11 DIAGNOSIS — R51 Headache: Secondary | ICD-10-CM | POA: Insufficient documentation

## 2018-07-11 LAB — URINALYSIS, COMPLETE (UACMP) WITH MICROSCOPIC
Bacteria, UA: NONE SEEN
Bilirubin Urine: NEGATIVE
Glucose, UA: NEGATIVE mg/dL
Hgb urine dipstick: NEGATIVE
Ketones, ur: NEGATIVE mg/dL
Leukocytes,Ua: NEGATIVE
Nitrite: NEGATIVE
Protein, ur: NEGATIVE mg/dL
Specific Gravity, Urine: 1.002 — ABNORMAL LOW (ref 1.005–1.030)
pH: 7 (ref 5.0–8.0)

## 2018-07-11 LAB — CBC
HCT: 35.5 % — ABNORMAL LOW (ref 36.0–46.0)
Hemoglobin: 12 g/dL (ref 12.0–15.0)
MCH: 30.8 pg (ref 26.0–34.0)
MCHC: 33.8 g/dL (ref 30.0–36.0)
MCV: 91.3 fL (ref 80.0–100.0)
Platelets: 255 10*3/uL (ref 150–400)
RBC: 3.89 MIL/uL (ref 3.87–5.11)
RDW: 14.2 % (ref 11.5–15.5)
WBC: 4.9 10*3/uL (ref 4.0–10.5)
nRBC: 0 % (ref 0.0–0.2)

## 2018-07-11 LAB — URINE DRUG SCREEN, QUALITATIVE (ARMC ONLY)
Amphetamines, Ur Screen: NOT DETECTED
Barbiturates, Ur Screen: NOT DETECTED
Benzodiazepine, Ur Scrn: NOT DETECTED
Cannabinoid 50 Ng, Ur ~~LOC~~: NOT DETECTED
Cocaine Metabolite,Ur ~~LOC~~: NOT DETECTED
MDMA (Ecstasy)Ur Screen: NOT DETECTED
Methadone Scn, Ur: NOT DETECTED
Opiate, Ur Screen: NOT DETECTED
Phencyclidine (PCP) Ur S: NOT DETECTED
Tricyclic, Ur Screen: NOT DETECTED

## 2018-07-11 LAB — BASIC METABOLIC PANEL
Anion gap: 7 (ref 5–15)
BUN: 14 mg/dL (ref 8–23)
CO2: 26 mmol/L (ref 22–32)
Calcium: 8.9 mg/dL (ref 8.9–10.3)
Chloride: 102 mmol/L (ref 98–111)
Creatinine, Ser: 0.53 mg/dL (ref 0.44–1.00)
GFR calc Af Amer: >60 mL/min (ref >60)
GFR calc non Af Amer: >60 mL/min (ref >60)
Glucose, Bld: 103 mg/dL — ABNORMAL HIGH (ref 70–99)
Potassium: 3.6 mmol/L (ref 3.5–5.1)
Sodium: 135 mmol/L (ref 135–145)

## 2018-07-11 LAB — HEPATIC FUNCTION PANEL
ALT: 22 U/L (ref 0–44)
AST: 25 U/L (ref 15–41)
Albumin: 3.8 g/dL (ref 3.5–5.0)
Alkaline Phosphatase: 95 U/L (ref 38–126)
Bilirubin, Direct: <0.1 mg/dL (ref 0.0–0.2)
Total Bilirubin: 0.3 mg/dL (ref 0.3–1.2)
Total Protein: 6.5 g/dL (ref 6.5–8.1)

## 2018-07-11 LAB — TROPONIN I (HIGH SENSITIVITY): Troponin I (High Sensitivity): <2 ng/L (ref <18)

## 2018-07-11 MED ORDER — SODIUM CHLORIDE 0.9 % IV BOLUS
500.0000 mL | Freq: Once | INTRAVENOUS | Status: AC
  Administered 2018-07-11: 19:00:00 500 mL via INTRAVENOUS

## 2018-07-11 MED ORDER — IOHEXOL 350 MG/ML SOLN
75.0000 mL | Freq: Once | INTRAVENOUS | Status: AC | PRN
  Administered 2018-07-11: 17:00:00 75 mL via INTRAVENOUS

## 2018-07-11 NOTE — ED Triage Notes (Signed)
PAtient c/o central and left sided chest tightness. Left sided head aching and reports swollen left leg.

## 2018-07-11 NOTE — ED Notes (Signed)
Pt requesting assistance to toilet. Pt assisted to toilet. Pt finishing up and this RN trying to assist pt back to bed. Pt jerks arm away from RN and states no don't touch my IV, RN tells pt its fine she won't, pt then says no it is not fine and pulls away again. RN states Im sorry I was just trying to assist you like you had asked. Pt ambulatory without difficulty to bed. CT at bedside to transport pt to CT for scan. Pt informed that we will start fluids once she returns.

## 2018-07-11 NOTE — ED Notes (Signed)
assisted pt to toilet to void She reports that she thinks her BP is dangerously high - at BP check it was 167/84 - pt appears in NAD at this time

## 2018-07-11 NOTE — ED Notes (Signed)
Attempted to start IV on pt when pt moved and screamed out. Pt instructed to hold still so I could finish her IV. Pt got really upset and stating she is not moving and that I need to take it out right now. IV withdrawn and pt informed that I would let our Charge RN know. Pt states she only wants the best and I had one chance and that was it. Pt keeps arguing and states you don't blame the pt in healthcare. Pt instructed everything is ok and that she is going to go for her scan.

## 2018-07-11 NOTE — ED Notes (Signed)
PAtient refused to let triage staff draw her blood. She requested phlebotomist be called to come draw her blood. Phlebotomist called.

## 2018-07-11 NOTE — Telephone Encounter (Signed)
Spoke with the pt. Pt sts that this morning she had an episode of extreme pain behind her left eye. Pt denies change in vision. Pt also reports dropping of the left side of her lower lip. Pt sts that sh still has a headache. Pt speech seems slurred, but I am not sure if that is her usual speech pattern.  Pt also complains of pain and tightness under her left breast, and extreme swelling in both legs. Pt sts that the pain under her breast does not radiate. Pt denies sob, palpitations, syncope, n/v, diaphoresis.  Adv the pt that Dr. Rockey Situ is out of the office until Monday. Adv pt that based on her report of left side facial drooping associated with extreme pain on the left side of her head she should be seen in the ED asap for evaluation. Adv her to contact EMS to be transported.  Pt seemed reluctant but agreed. Adv the pt that I will fwd an update to Dr. Rockey Situ on his return

## 2018-07-11 NOTE — Discharge Instructions (Signed)
Please call Dr. Lupita Dawn office to schedule a follow up appointment. I have requested your new rolling walker.  Return to the ER for symptoms that change or worsen.

## 2018-07-11 NOTE — TOC Progression Note (Addendum)
Transition of Care [facility]) - Progression Note    Patient Details  Name: Angela [name] MRN: [id number] Date of Birth: 11-27-1950  [facility] CM/SW Contact  Marshell Garfinkel, RN Phone Number: 07/11/2018, 4:53 PM  Clinical Narrative:    Rolling walker with seat and 4-wheels requested from [facility] with [employer] as patient shares that her is broken and cannot safely sit in it because back bar is broken. Patient requests home health through [facility] from previous CMS.gov list she received on another hospitalization.  Helene Kelp with [facility] states there will be a delay but will add on for start of care. Patient updated.  [facility] Medicare still shows active. Message left for Dr. Lupita Dawn office patient requesting change of PCPs. Update at 1705: insurance will not cover rollator as patient just received the other one in last 5 years (3 years).    Expected Discharge Plan: [place] Barriers to Discharge: Continued Medical Work up  Expected Discharge Plan and Services Expected Discharge Plan: [place]   Discharge Planning Services: CM Consult Post Acute Care Choice: Home Health, Durable Medical Equipment Living arrangements for the past 2 months: Apartment                 DME Arranged: Walker rolling with seat DME Agency: [employer] Date DME Agency Contacted: 07/11/18 Time DME Agency Contacted: [phone] Representative spoke with at DME Agency: [place]: PT, Nurse's Aide, Social Work [employer] Agency: [facility] ([facility] [place]) Date [place]: 07/11/18 Time [place]: 1651 Representative spoke with at [place]: [place] ([place]) Interventions    Readmission Risk Interventions No flowsheet data found.

## 2018-07-11 NOTE — ED Provider Notes (Signed)
[facility] Emergency Department Provider Note  ___________________________________________   None    (approximate)  I have reviewed the triage vital signs and the nursing notes.   HISTORY  Chief Complaint Chest Pain   HPI Angela [name] is a 68 y.o. female who presents to the emergency department for treatment and evaluation of multiple medical complaints. She states that this morning she developed a headache on the left side of her head and behind the left eye.  She states that she subsequently developed some left-sided chest wall pain.  She did not take any medication for either of these issues.  She states that she did call Dr. Donivan Scull office today for advice and was advised to come to the ER.   Patient also states that she has had lower extremity swelling for several weeks, but the left lower extremity has worsened.  She had a heel fracture  and was evaluated by Dr. Rudene Christians.  She was placed in a walking boot, but states that she is unable to wear it. She is currently only wearing a compression brace, sock, ACE and tennis shoe.      Past Medical History:  Diagnosis Date  . Allergic rhinitis   . Anemia   . Blurred vision   . Depression   . Diverticulosis   . Endometriosis   . Falls   . GERD (gastroesophageal reflux disease)   . Hernia, hiatal   . Hypothyroidism   . IBS (irritable bowel syndrome)   . Malignant neoplasm of skin   . Migraine   . Neuropathy   . PTSD (post-traumatic stress disorder)   . Thyroid disease     Patient Active Problem List   Diagnosis Date Noted  . Aortic atherosclerosis ([place]) 05/31/2018  . Chest pain of uncertain etiology 01/75/1025  . Achilles tendon disorder 04/25/2018  . Insomnia 04/25/2018  . Diastolic dysfunction 85/27/7824  . Mitral regurgitation 03/29/2018  . Bronchospasm 03/15/2018  . Axis II diagnosis deferred 02/20/2018  . Preventative health care 02/08/2018  . Polypharmacy 02/01/2018  .  Personality disorder ([place]) 12/25/2017  . Noncompliance 12/25/2017  . Medication monitoring encounter 12/07/2017  . Family history of high cholesterol 12/07/2017  . Arthritis of hand, degenerative 12/07/2017  . Bipolar I disorder, most recent episode mixed, severe with psychotic features ([place]) 09/18/2017  . Agitation 09/18/2017  . Depressed mood 07/11/2017  . Hallux valgus, acquired 07/05/2017  . Hammer toes of both feet 07/05/2017  . Unstable ankle 07/05/2017  . Chronic bilateral low back pain without sciatica 06/09/2017  . Tinea unguium 05/08/2017  . Unresolved grief 05/08/2017  . Conductive hearing loss of right ear with unrestricted hearing of left ear 04/03/2017  . Adjustment disorder with depressed mood 01/18/2017  . Migraine 01/18/2017  . Postmenopausal bleeding 01/18/2017  . IBS (irritable bowel syndrome) 11/17/2016  . Bilateral carpal tunnel syndrome 11/17/2016  . Plantar fasciitis, bilateral 11/17/2016  . Cervical myofascial pain syndrome 11/17/2016  . Chronic hyponatremia 11/17/2016  . PTSD (post-traumatic stress disorder) 11/17/2016  . Hiatal hernia 11/17/2016  . Vitamin D deficiency 11/17/2016  . Vitamin B12 deficiency 11/17/2016  . SIADH (syndrome of inappropriate ADH production) ([place]) 09/08/2016  . Cervical spondylosis with myelopathy 05/24/2016  . Post-concussion headache 05/24/2016  . MCI (mild cognitive impairment) with memory loss 03/30/2016  . Gait abnormality 03/30/2016  . Chronic bipolar affective disorder ([place]) 01/24/2016  . Recurrent major depressive disorder, in partial remission ([place]) 01/24/2016  . Anemia, iron deficiency 06/24/2015  . Endometriosis  06/24/2015  . Benign essential HTN 06/24/2015  . History of falling 05/22/2015  . History of TIA (transient ischemic attack) 04/20/2015  . Hypothyroidism 04/20/2015  . GERD (gastroesophageal reflux disease) 04/20/2015  . Cervical disc disorder at C5-C6 level with radiculopathy 03/25/2015  . Chronic neck  pain 03/25/2015  . Pelvic pain in female 10/30/2014  . History of skin cancer 06/30/2014  . Chronic sinusitis 06/30/2014  . Chronic headaches 12/16/2013  . Neuropathy 12/16/2013    Past Surgical History:  Procedure Laterality Date  . APPENDECTOMY    . CHOLECYSTECTOMY    . CHOLECYSTECTOMY, LAPAROSCOPIC    . ESOPHAGOGASTRODUODENOSCOPY (EGD) WITH PROPOFOL N/A 03/29/2015   Procedure: ESOPHAGOGASTRODUODENOSCOPY (EGD) WITH PROPOFOL;  Surgeon: Josefine Class, MD;  Location: [facility] ENDOSCOPY;  Service: Endoscopy;  Laterality: N/A;  . HERNIA REPAIR    . laproscopy    . TONSILLECTOMY    . uteral suspension      Prior to Admission medications   Medication Sig Start Date End Date Taking? Authorizing Provider  acetaminophen (TYLENOL) 650 MG CR tablet Take 1 tablet (650 mg total) by mouth every 8 (eight) hours as needed for pain. 04/12/18   Poulose, Bethel Born, NP  albuterol (VENTOLIN HFA) 108 (90 Base) MCG/ACT inhaler Inhale 2 puffs into the lungs every 4 (four) hours as needed for wheezing or shortness of breath. 06/03/18   Hubbard Hartshorn, FNP  Amino Acids-Protein Hydrolys (FEEDING SUPPLEMENT, PRO-STAT 64,) LIQD Take 30 mLs by mouth daily. 04/12/18   Poulose, Bethel Born, NP  atorvastatin (LIPITOR) 20 MG tablet Take 1 tablet (20 mg total) by mouth at bedtime. 06/03/18   Hubbard Hartshorn, FNP  azithromycin (ZITHROMAX) 250 MG tablet Take 1 tablet (250 mg total) by mouth every other day. Take every Mon, Wed, Fri 07/01/18   Wilhelmina Mcardle, MD  Biotin 10000 MCG TABS Take by mouth.    [provider]  Cholecalciferol 25 MCG (1000 UT) tablet Take 1 tablet (1,000 Units total) by mouth daily. 12/26/17   Clapacs, Madie Reno, MD  Cyanocobalamin (VITAMIN B-12) 1000 MCG SUBL Place under the tongue.    [provider]  cycloSPORINE (RESTASIS) 0.05 % ophthalmic emulsion Place 1 drop into both eyes 2 (two) times daily. 06/03/18   Hubbard Hartshorn, FNP  diclofenac sodium (VOLTAREN) 1 % GEL Apply small  amount (2 grams) topically over knees or hands up to four times a day if needed for arthritis 06/03/18   Hubbard Hartshorn, FNP  famotidine (PEPCID) 20 MG tablet Take 1 tablet (20 mg total) by mouth 2 (two) times daily as needed for heartburn or indigestion. 06/03/18   Hubbard Hartshorn, FNP  ferrous sulfate 325 (65 FE) MG tablet Take 325 mg by mouth daily with breakfast.    [provider]  fluticasone (FLONASE) 50 MCG/ACT nasal spray Place into both nostrils daily.    [provider]  gabapentin (NEURONTIN) 100 MG capsule Take 100 mg by mouth 2 (two) times daily.    [provider]  Glucosamine-Chondroitin (GLUCOSAMINE CHONDR COMPLEX PO) Take by mouth.    [provider]  hydrOXYzine (ATARAX/VISTARIL) 10 MG tablet TAKE 1 TO 2 TABLETS BY MOUTH ONCE DAILY AS NEEDED 02/05/18   [provider]  Icosapent Ethyl (VASCEPA) 1 g CAPS TAKE 2 CAPSULES BY MOUTH TWICE DAILY (TO HELP WITH TRIGLYCERIDES) 06/03/18   Hubbard Hartshorn, FNP  levothyroxine (SYNTHROID) 100 MCG tablet Take 1 tablet (100 mcg total) by mouth daily at 6 (six)  AM. 06/03/18   Hubbard Hartshorn, FNP  linaclotide Rolan Lipa) 290 MCG CAPS capsule Take 1 capsule (290 mcg total) by mouth daily before breakfast. 12/26/17   Clapacs, Madie Reno, MD  LORazepam (ATIVAN) 0.5 MG tablet TAKE 1 TABLET BY MOUTH AS NEEDED ONCE DAILY 02/06/18   [provider]  losartan (COZAAR) 50 MG tablet Take 1 tablet (50 mg total) by mouth daily. 06/19/18 09/17/18  Minna Merritts, MD  magnesium oxide (MAG-OX) 400 (241.3 Mg) MG tablet Take 0.5 tablets (200 mg total) by mouth 2 (two) times daily. Patient taking differently: Take 500 mg by mouth daily.  12/26/17   Clapacs, Madie Reno, MD  Multiple Vitamin (MULTIVITAMIN WITH MINERALS) TABS tablet Take 1 tablet by mouth daily. 12/26/17   Clapacs, Madie Reno, MD  PARoxetine (PAXIL-CR) 25 MG 24 hr tablet Take 1 tablet (25 mg total) by mouth daily. 12/27/17   Clapacs, Madie Reno, MD  polyvinyl alcohol  (LIQUIFILM TEARS) 1.4 % ophthalmic solution Place 1 drop into both eyes as needed for dry eyes. 12/26/17   Clapacs, Madie Reno, MD  PREMARIN vaginal cream  01/31/18   [provider]  promethazine (PHENERGAN) 12.5 MG tablet Take 12.5 mg by mouth as needed.  02/05/18 08/04/18  [provider]  pyridoxine (B-6) 100 MG tablet Take 1 tablet (100 mg total) by mouth 2 (two) times daily. 12/26/17   Clapacs, Madie Reno, MD  Respiratory Therapy Supplies (FLUTTER) DEVI 1 each by Does not apply route daily. 07/01/18   Wilhelmina Mcardle, MD  simethicone (MYLICON) 80 MG chewable tablet Chew by mouth. 02/05/18 02/05/19  [provider]  tiZANidine (ZANAFLEX) 2 MG tablet Take by mouth every 6 (six) hours as needed for muscle spasms.    [provider]  traZODone (DESYREL) 50 MG tablet 50 mg as needed. Pt takes 1/2 tablet qhs 01/31/18   [provider]  umeclidinium bromide (INCRUSE ELLIPTA) 62.5 MCG/INH AEPB Inhale 1 puff into the lungs daily. 07/01/18   Wilhelmina Mcardle, MD  UNABLE TO FIND Recliner with elevated leg rest, automatic and raises off of the floor to assist with standing 04/25/18   Lada, Satira Anis, MD    Allergies Advair diskus [fluticasone-salmeterol], Bee venom, Darvon [propoxyphene], Dicyclomine, Other, Oxycodone-acetaminophen, Peanuts [peanut oil], Penicillins, Sulfa antibiotics, Sulfacetamide sodium, Ultram [tramadol], Amikacin, Aspirin, Azithromycin, Doxycycline, Haloperidol, Hymenoptera venom preparations, Imipramine, Keflex [cephalexin], Lactose intolerance (gi), Losartan, Lyrica [pregabalin], Risperdal [risperidone], Sulfur, Adhesive [tape], Albuterol, Ciprofloxacin, Hydroxyzine, Peanut butter flavor, and Prednisone  Family History  Problem Relation Age of Onset  . Heart disease Father   . Hearing loss Father   . COPD Father   . Depression Father   . Stroke Father   . Vision loss Father   . Varicose Veins Father   . Cancer Mother        lung  . Depression  Sister   . Arthritis Sister   . Arthritis Brother   . Hernia Brother   . Anxiety disorder Brother   . Arthritis Brother   . Hernia Brother   . Anxiety disorder Brother   . Urolithiasis Neg Hx   . Kidney disease Neg Hx   . Kidney cancer Neg Hx   . Prostate cancer Neg Hx     Social History Social History   Tobacco Use  . Smoking status: Former Smoker    Quit date: 11/17/1976    Years since quitting: 41.6  . Smokeless tobacco: Never Used  Substance Use Topics  .  Alcohol use: Yes    Comment: occ  . Drug use: No    Review of Systems  Constitutional: No fever/chills Eyes: No visual changes. ENT: No sore throat. Cardiovascular: Positive for chest pain. Respiratory: Denies shortness of breath. Gastrointestinal: No abdominal pain.  No nausea, no vomiting.  No diarrhea.  No constipation. Genitourinary: Negative for dysuria. Musculoskeletal: Negative for back pain. Skin: Negative for rash. Neurological: Positive for headaches, negative for focal weakness or numbness. ____________________________________________   PHYSICAL EXAM:  VITAL SIGNS:  ED Triage Vitals  Enc Vitals Group     BP -- 167/78     Pulse -- 58     Resp -- 14     Temp --      Temp src --      SpO2 -- 100     Weight 07/11/18 1418 140 lb (63.5 kg)     Height 07/11/18 1418 5\' 3"  (1.6 m)     Head Circumference --      Peak Flow --      Pain Score 07/11/18 1417 9     Pain Loc --      Pain Edu? --      Excl. in [place]? --     Constitutional: Alert and oriented. Chronically ill appearing and in no acute distress. Eyes: Conjunctivae are normal. Head: Atraumatic. Nose: No congestion/rhinnorhea. Mouth/Throat: Mucous membranes are moist.  Oropharynx non-erythematous. Neck: No stridor.   Cardiovascular: Normal rate, regular rhythm. Grossly normal heart sounds.  Good peripheral circulation. Respiratory: Normal respiratory effort.  No retractions. Lungs CTAB. Gastrointestinal: Soft and nontender. No  distention. No abdominal bruits. No CVA tenderness. Musculoskeletal: Left lower extremity tenderness and edema.  No joint effusions. Neurologic:  Slow, clear speach. No gross focal neurologic deficits are appreciated.  Skin:  Skin is warm, dry and intact. No rash noted. Psychiatric: Mood and affect are flat. Irritable.  ____________________________________________   LABS (all labs ordered are listed, but only abnormal results are displayed)  Labs Reviewed  BASIC METABOLIC PANEL - Abnormal; Notable for the following components:      Result Value   Glucose, Bld 103 (*)    All other components within normal limits  CBC - Abnormal; Notable for the following components:   HCT 35.5 (*)    All other components within normal limits  URINALYSIS, COMPLETE (UACMP) WITH MICROSCOPIC - Abnormal; Notable for the following components:   Color, Urine STRAW (*)    APPearance CLEAR (*)    Specific Gravity, Urine 1.002 (*)    All other components within normal limits  TROPONIN I (HIGH SENSITIVITY)  HEPATIC FUNCTION PANEL  URINE DRUG SCREEN, QUALITATIVE ([facility] ONLY)  TROPONIN I (HIGH SENSITIVITY)   ____________________________________________  EKG  ED ECG REPORT I, Shaun Runyon, FNP-BC personally viewed and interpreted this ECG.   Date: 07/11/2018  EKG Time: 1415  Rate: 62  Rhythm: normal EKG, normal sinus rhythm  Axis: normal  Intervals:none  ST&T Change: no ST elevation  ____________________________________________  RADIOLOGY  ED MD interpretation:  CT head negative for acute abnormality per radiology.  US of the left lower extremity also negative for acute findings.  CTA Chest for PE negative.  Official radiology report(s): Dg Chest 2 View  Result Date: 07/11/2018 CLINICAL DATA:  68 year old female with left-sided chest pain and edema EXAM: CHEST - 2 VIEW COMPARISON:  06/08/2018 FINDINGS: Cardiomediastinal silhouette unchanged in size and contour. No interlobular septal  thickening. No central vascular congestion. Coarsened interstitial markings throughout. Double density projects  over the lower mediastinum. No pneumothorax or pleural effusion. Stigmata of emphysema, with increased retrosternal airspace, flattened hemidiaphragms, increased AP diameter, and hyperinflation on the AP view. No confluent airspace disease. No displaced fracture.  Degenerative changes of the spine IMPRESSION: Chronic lung changes with evidence of emphysema and no evidence of superimposed acute cardiopulmonary disease. Hiatal hernia. Electronically Signed   By: Corrie Mckusick D.O.   On: 07/11/2018 15:39   Ct Head Wo Contrast  Result Date: 07/11/2018 CLINICAL DATA:  Left-sided headache. EXAM: CT HEAD WITHOUT CONTRAST TECHNIQUE: Contiguous axial images were obtained from the base of the skull through the vertex without intravenous contrast. COMPARISON:  CT head dated September 06, 2016. FINDINGS: Brain: No evidence of acute infarction, hemorrhage, hydrocephalus, extra-axial collection or mass lesion/mass effect. Unchanged lacunar infarct in the right caudate head. Stable mild atrophy. Vascular: Atherosclerotic vascular calcification of the carotid siphons. No hyperdense vessel. Skull: Normal. Negative for fracture or focal lesion. Sinuses/Orbits: No acute finding. Other: None. IMPRESSION: 1.  No acute intracranial abnormality. Electronically Signed   By: Titus Dubin M.D.   On: 07/11/2018 15:44   Ct Angio Chest Pe W And/or Wo Contrast  Result Date: 07/11/2018 CLINICAL DATA:  Central and left chest pain. EXAM: CT ANGIOGRAPHY CHEST WITH CONTRAST TECHNIQUE: Multidetector CT imaging of the chest was performed using the standard protocol during bolus administration of intravenous contrast. Multiplanar CT image reconstructions and MIPs were obtained to evaluate the vascular anatomy. CONTRAST:  54mL OMNIPAQUE IOHEXOL 350 MG/ML SOLN COMPARISON:  November 25, 2016 FINDINGS: Cardiovascular: Satisfactory  opacification of the pulmonary arteries to the segmental level. No evidence of pulmonary embolism. Normal heart size. No pericardial effusion. Mediastinum/Nodes: No enlarged mediastinal, hilar, or axillary lymph nodes. Thyroid gland, trachea demonstrate no significant findings. Large hiatal hernia is unchanged. Lungs/Pleura: There is no pulmonary nodule or mass. No focal pneumonia or pleural effusions identified. Mild atelectasis of posterior lungs are noted. No pneumothorax. Upper Abdomen: No acute abnormality. Musculoskeletal: No acute abnormality. Degenerative joint changes of the spine are noted. Chronic compression deformity of a midthoracic vertebral bodies unchanged. Review of the MIP images confirms the above findings. IMPRESSION: No pulmonary embolus. No acute abnormality identified in the chest. Large hiatal hernia unchanged. Electronically Signed   By: Abelardo Diesel M.D.   On: 07/11/2018 17:58   US Venous Img Lower Unilateral Left  Result Date: 07/11/2018 CLINICAL DATA:  68 year old female with a history of swelling EXAM: LEFT LOWER EXTREMITY VENOUS DOPPLER ULTRASOUND TECHNIQUE: Gray-scale sonography with graded compression, as well as color Doppler and duplex ultrasound were performed to evaluate the lower extremity deep venous systems from the level of the common femoral vein and including the common femoral, femoral, profunda femoral, popliteal and calf veins including the posterior tibial, peroneal and gastrocnemius veins when visible. The superficial great saphenous vein was also interrogated. Spectral Doppler was utilized to evaluate flow at rest and with distal augmentation maneuvers in the common femoral, femoral and popliteal veins. COMPARISON:  None. FINDINGS: Contralateral Common Femoral Vein: Respiratory phasicity is normal and symmetric with the symptomatic side. No evidence of thrombus. Normal compressibility. Common Femoral Vein: No evidence of thrombus. Normal compressibility,  respiratory phasicity and response to augmentation. Saphenofemoral Junction: No evidence of thrombus. Normal compressibility and flow on color Doppler imaging. Profunda Femoral Vein: No evidence of thrombus. Normal compressibility and flow on color Doppler imaging. Femoral Vein: No evidence of thrombus. Normal compressibility, respiratory phasicity and response to augmentation. Popliteal Vein: No evidence of thrombus. Normal compressibility,  respiratory phasicity and response to augmentation. Calf Veins: No evidence of thrombus. Normal compressibility and flow on color Doppler imaging. Superficial Great Saphenous Vein: No evidence of thrombus. Normal compressibility and flow on color Doppler imaging. Other Findings:  None. IMPRESSION: Sonographic survey of the left lower extremity negative for DVT Electronically Signed   By: Corrie Mckusick D.O.   On: 07/11/2018 15:53    ____________________________________________   PROCEDURES  Procedure(s) performed: None  Procedures  Critical Care performed: No  ____________________________________________   INITIAL IMPRESSION / ASSESSMENT AND PLAN / ED COURSE 68 year old female presenting to the emergency department for treatment and evaluation of multiple medical complaints. She reports onset of headache, left chest pain, and left lower extremity swelling. She wants to make sure that the entire staff knows that she has medical knowledge and will only accept the best care from everyone. She also requests to speak with social work regarding housing, family abandonment, financial difficulty, and inability to find a Therapist, music for medical assistance.   On exam, she is alert and oriented x 3. Speech is slow, but not slurred. Neuro exam is unremarkable. She was observed ambulating and has been able to transition in bed using all extremities. She does change topic of conversation in the midst of answering a direct question, but is easily redirected. She  seems mad in general.     ----------------------------------------- 4:19 PM on 07/11/2018 -----------------------------------------  Social work in with patient to discuss her many social issues. At this point, her labs and images are reassuring. Considering chest CT for PE, however the patient shouted at the RN during insertion of IV and demanded that it be removed. I did witness this and although the IV was in and providing good blood return and only needed to be advanced and secured, the patient demanded that it be removed. I will discuss insertion of another IV with the patient and proceed from there.  ----------------------------------------- 4:54 PM on 07/11/2018 -----------------------------------------  IV discussed with the patient who will agree to allow the charge nurse to insert it. She is hyper focused on her poor opinion of heath care, social support, and elected officials in [place].  ----------------------------------------- 8:25 PM on 07/11/2018 -----------------------------------------  CT for PE is negative. All testing is reassuring. She will be discharged home to follow up with Dr. Derrel Nip. She was also given a list of community resources and order for a new rolling walker. She was advised to return to the ER for symptoms that change or worsen or for new concerns.  Clinical Course as of Jul 10 2040  Thu Jul 11, 2018  1537 Troponin I (High Sensitivity) [CT]    Clinical Course User Index [CT] Sherrie George B, FNP     ____________________________________________   FINAL CLINICAL IMPRESSION(S) / ED DIAGNOSES  Final diagnoses:  Swelling  Atypical chest pain  Edema, peripheral  Nonintractable episodic headache, unspecified headache type     ED Discharge Orders    None       Note:  This document was prepared using Dragon voice recognition software and may include unintentional dictation errors.    Victorino Dike, FNP 07/11/18 2042     Nena Polio, MD 07/11/18 2053

## 2018-07-11 NOTE — ED Notes (Signed)
Pt up to toilet with assistance. 

## 2018-07-11 NOTE — ED Notes (Signed)
Pt up to toilet 

## 2018-07-11 NOTE — TOC Initial Note (Addendum)
Transition of Care [facility]) - Initial/Assessment Note    Patient Details  Name: Angela [name] MRN: [id number] Date of Birth: 08/19/1950  [facility]) CM/SW Contact:    Fredric Mare, LCSW Phone Number: 07/11/2018, 4:39 PM  Clinical Narrative:                 Patient is a 68 year old female that presents to the ED for chest pain, and headache. Patient stated that she wanted to speak with a social worker to set up home health services. Patient just completed services with [facility] home [facility], and she did not like their services. Patient received SW, OT, PT and nurse aide. Patient shared that she uses a walker, and cane at home however the walker's bar is broken and it makes it uncomfortable to sit on.  Patient shared that she would like to start services with [facility] if possible. CSW informed her that she will check to see if they accept her insurance.  CSW made referral to [facility].      Expected Discharge Plan: [place] Barriers to Discharge: Continued Medical Work up   Patient Goals and CMS Choice     Choice offered to / list presented to : Patient  Expected Discharge Plan and Services Expected Discharge Plan: [place]   Discharge Planning Services: CM Consult Post Acute Care Choice: Home Health(pt would like to go home with home health) Living arrangements for the past 2 months: Apartment                                      Prior Living Arrangements/Services Living arrangements for the past 2 months: Apartment Lives with:: Self Patient language and need for interpreter reviewed:: Yes Do you feel safe going back to the place where you live?: Yes      Need for Family Participation in Patient Care: No (Comment) Care giver support system in place?: No (comment) Current home services: DME Criminal Activity/Legal Involvement Pertinent to Current Situation/Hospitalization: No - Comment as  needed  Activities of Daily Living      Permission Sought/Granted Permission sought to share information with : PCP(provided permission to set appointment with new PCP)                Emotional Assessment Appearance:: Appears stated age Attitude/Demeanor/Rapport: Apprehensive Affect (typically observed): Blunt, Hopeful Orientation: : Oriented to Self, Oriented to Place, Oriented to  Time, Oriented to Situation Alcohol / Substance Use: Alcohol Use(unsure how frequent she drinks) Psych Involvement: No (comment)  Admission diagnosis:  chest pain Patient Active Problem List   Diagnosis Date Noted  . Aortic atherosclerosis ([place]) 05/31/2018  . Chest pain of uncertain etiology 14/43/1540  . Achilles tendon disorder 04/25/2018  . Insomnia 04/25/2018  . Diastolic dysfunction 08/67/6195  . Mitral regurgitation 03/29/2018  . Bronchospasm 03/15/2018  . Axis II diagnosis deferred 02/20/2018  . Preventative health care 02/08/2018  . Polypharmacy 02/01/2018  . Personality disorder ([place]) 12/25/2017  . Noncompliance 12/25/2017  . Medication monitoring encounter 12/07/2017  . Family history of high cholesterol 12/07/2017  . Arthritis of hand, degenerative 12/07/2017  . Bipolar I disorder, most recent episode mixed, severe with psychotic features ([place]) 09/18/2017  . Agitation 09/18/2017  . Depressed mood 07/11/2017  . Hallux valgus, acquired 07/05/2017  . Hammer toes of both feet 07/05/2017  . Unstable ankle  07/05/2017  . Chronic bilateral low back pain without sciatica 06/09/2017  . Tinea unguium 05/08/2017  . Unresolved grief 05/08/2017  . Conductive hearing loss of right ear with unrestricted hearing of left ear 04/03/2017  . Adjustment disorder with depressed mood 01/18/2017  . Migraine 01/18/2017  . Postmenopausal bleeding 01/18/2017  . IBS (irritable bowel syndrome) 11/17/2016  . Bilateral carpal tunnel syndrome 11/17/2016  . Plantar fasciitis, bilateral 11/17/2016  .  Cervical myofascial pain syndrome 11/17/2016  . Chronic hyponatremia 11/17/2016  . PTSD (post-traumatic stress disorder) 11/17/2016  . Hiatal hernia 11/17/2016  . Vitamin D deficiency 11/17/2016  . Vitamin B12 deficiency 11/17/2016  . SIADH (syndrome of inappropriate ADH production) ([place]) 09/08/2016  . Cervical spondylosis with myelopathy 05/24/2016  . Post-concussion headache 05/24/2016  . MCI (mild cognitive impairment) with memory loss 03/30/2016  . Gait abnormality 03/30/2016  . Chronic bipolar affective disorder ([place]) 01/24/2016  . Recurrent major depressive disorder, in partial remission ([place]) 01/24/2016  . Anemia, iron deficiency 06/24/2015  . Endometriosis 06/24/2015  . Benign essential HTN 06/24/2015  . History of falling 05/22/2015  . History of TIA (transient ischemic attack) 04/20/2015  . Hypothyroidism 04/20/2015  . GERD (gastroesophageal reflux disease) 04/20/2015  . Cervical disc disorder at C5-C6 level with radiculopathy 03/25/2015  . Chronic neck pain 03/25/2015  . Pelvic pain in female 10/30/2014  . History of skin cancer 06/30/2014  . Chronic sinusitis 06/30/2014  . Chronic headaches 12/16/2013  . Neuropathy 12/16/2013   PCP:  Arnetha Courser, MD Pharmacy:   [facility] [address]), [place]  [address] Phone: [phone] Fax: [phone]  [place] [address] [name] [address] Phone: [phone] Fax: [phone]  [place], [place] [facility] [address] [place] [address] [place] [address] Phone: [phone] Fax: [phone]     Social Determinants of Health ([place]) Interventions    Readmission Risk Interventions No flowsheet data found.

## 2018-07-11 NOTE — Telephone Encounter (Signed)
Pt c/o BP issue: STAT if pt c/o blurred vision, one-sided weakness or slurred speech  1. What are your last 5 BP readings? 177/85 pulse 50  2. Are you having any other symptoms (ex. Dizziness, headache, blurred vision, passed out)? Dizziness, head is hurting on left side, severe swelling  3. What is your BP issue? Patient calling, states she is having a lot of issues and wants to see Dr Rockey Situ today.  Does not want to go to the ER.  Gave me some information but then repeatedly would not give me anymore details, only wanting to speak with nurse ASAP.  Please call to discuss.

## 2018-07-11 NOTE — ED Notes (Signed)
Patient transported to X-ray 

## 2018-07-12 ENCOUNTER — Telehealth: Payer: Self-pay | Admitting: Cardiovascular Disease

## 2018-07-12 ENCOUNTER — Telehealth: Payer: Self-pay | Admitting: *Deleted

## 2018-07-12 ENCOUNTER — Other Ambulatory Visit: Payer: Self-pay | Admitting: *Deleted

## 2018-07-12 ENCOUNTER — Encounter: Payer: Self-pay | Admitting: *Deleted

## 2018-07-12 NOTE — Telephone Encounter (Signed)
Patient has not heard anything from [facility] to schedule referral for hernia eval.  She is still having pretty bad pain and needs to see someone asap   Patient given contact info to [facility] out to [facility] .  Per notes referral faxed 6/4    Please call patient to discuss issues and she also wants appt with [facility] asap  Because she no longer has a pcp Dr. Sanda Klein left the practice

## 2018-07-12 NOTE — Telephone Encounter (Signed)
Copied from [place] [id number]. Topic: Appointment Scheduling - Scheduling Inquiry for Clinic >> Jul 11, 2018  4:40 PM Erick Blinks wrote: Reason for CRM: Pt wanting to establish care with Dr. Derrel Nip. Please advise

## 2018-07-12 NOTE — Telephone Encounter (Signed)
ER notes reviewed,  No cardiac issue noted For chronic chest pain, we have suggested surgery for hiatal hernia (she is aware) Last seen 05/2018.

## 2018-07-12 NOTE — Telephone Encounter (Signed)
LOOKS LIKE DR Rockey Situ REFERRED HER.  OK TO SET UP

## 2018-07-12 NOTE — Patient Outreach (Addendum)
[facility]) Care Management [place] Telephone Outreach  07/12/2018  Angela [name] 1950/12/04 [id number]  Successful telephone outreach to Angela [name], 68 y/o female patient referred to [place] by inpatient hospital CSW for community resource needs after patient had ED visit yesterday for atypical chest pain.  Patient was discharged home with home health services through [facility] post- ED visit yesterday.  Patient has history including, but not limited to: anxiety/ depression/ bipolar; chronic bronchitis; HTN; GERD.  HIPAA/ identity verified with patient during phone call today.    [place] referral/ services were discussed with patient today, who stated immediately that she is very irritated and tired today.  Patient stated that she "is disgusted with healthcare in [place]," stating that "no one wants to do any actual work-- they just want the patient's to do all the work while they sit back and make all the money."  Patient states that she has "fired" her previous PCP due to "they didn't do anything they said they would, and they didn't do anything for me."  Patient eventually agrees to [place] services.  Patient states she lives in [place], [place] but does not provide her physical address; states that her mailing address is:  [address] [place], [place] [address] Requests [facility] CM patient welcome letter be mailed to her at this address; requests that I not add this address to her medical record  Today, patient stated that she "is tired of talking to so many people on the phone today;" states she was getting ready to lie down and take a nap as she is exhausted after ED visit yesterday.  Patient denies pain today and she sounds to be in no distress throughout 50 minute phone call today.  Patient further reports:  Medications: -- Has all medicationsand takes as prescribed;declines medication review today stating she "is too  tired" -- reports that she asked previous PCP to set her up with [facility] mail program to obtain her prescriptions, but "they said they did it but they didn't;" reports prescriptions currently obtained from [facility] in [place] -- self-manage medications using weekly pill planner box. -- denies issues with swallowing medications -- states that she asked previous PCP team to review her medications with her, including side effects/ purpose of medications, etc; states "they didn't do it;" requests same today- discussed with patient that I would place [place] referral and encouraged her to discuss her medication concerns, do a medication review when outreach is attempted and to engage with [place] team -- reports had recent new patient office visit pulmonology provider on July 01, 2018; stated that she "has a nebulizer but needs new tubing for it;" stated that she did not mention this to pulmonary provider at time of visit  Home health [facility]) services: -- [facility] services were placed at time of ED visit last night by inpatient RN CM through [facility] -- patient has not yet heard back from [facility], and she declines my offer to provide phone number to agency, stating that it "is there job" to contact her and that she prefers to wait to hear from them -- states she had a previous home health agency involved, but did not like their services, stating they "only wanted to come for 5 weeks," stating that she "needs long term" in-home care assistance.  Discussed difference between home health services/ [facility] CM services/ personal care services through [facility]- which patient states "used to  be" in place through "[place];" patient also verbalizes displeasure with the [employer]" and stated that she would like to be set up with personal care services "with another company"    Provider appointments: -- Upcoming provider appointments were reviewed with patient today-- noted through  review of EMR that she contacted Angela [name] at [facility] Primary care  This morning and that it appears that patient has been accepted as new patient by Angela [name] -- encouraged patient to maintain communication with new PCP to obtain a new patient visit soon, and she verbalizes agreement, stating she will do  [employer] needs: -- currently verbalizes multiple community resource needs, stating that she has no family/ friends that assist with care needs: ---- transportation in place through [employer] services for transport to provider appointments only: states she needs transportation set up for errands such as going to grocery store, etc ---- reports "did have" personal care services through [facility]" but states that she was not happy with services, which are no longer in place- states she "wants personal care services set up, but with a different agency" (see note above in home health section); [employer]ient adamant that she needs ongoing in-home care assistance for food preparation, household chores, and personal care with bathing assistance -- can not communicate with medicaid case worker: states "they never return any calls placed to them" -- needs assistance in paying bills, states "has no money for anything," including food insecurity -- discussed with patient that I would place [facility] CSW referral to contact her to discuss her stated needs; discussed limits of [facility] CSW interventions, according to patient's eligibility for services  Self-health management of chronic bronchitis/ overall health: -- 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," although she does not tell me exactly what kind of work she used to do; 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" -- denies difficulty breathing, home O2, etc; states "breathing fine today" -- states that "she knows what she needs to do" when she "runs into trouble" -- reports had recent new  patient office visit pulmonology provider on July 01, 2018; stated that she "has a nebulizer but needs new tubing for it;" stated that she did not mention this to pulmonary provider at time of visit -- reports that she has a cardiologist, but denies cardiac history today- states she has a hernia that "presses up against" her heart and causes chest pain -- reports that she needs a referral to [facility] hospital to have "hernia surgery" and reports that her cardiologist is facilitating placing this referral  Patient denies further issues, concerns, or problems today and wants to lie down and take a nap.  I provided/ confirmed that patient has my direct phone number, the main [facility] CM office phone number, and the [facility] CM 24-hour nurse advice phone number should issues arise prior to next scheduled [place] outreach by phone in 2 weeks per patient preference after she has hopefully spoken with [place] and pharmacy teams and obtained new patient PCP office visit.  Encouraged patient to contact me directly if needs, questions, issues, or concerns arise prior to next scheduled outreach; patient agreed to do so.  Plan:  Patient will take medications as prescribed and will attend all scheduled provider appointments  Patient will promptly schedule office visit with newly acquired PCP  Patient will promptly notify care providers for any new concerns/ issues/ problems that arise  Patient will actively participate in home health services as ordered post-ED discharge yesterday  I will make patient's PCP aware of [place] RN CM involvement in patient's care-- will send barriers letter  [place] and [place] referrals placed  [place] outreach to continue with scheduled phone in 2 weeks  [facility] CM Care Plan Problem One     Most Recent Value  Care Plan Problem One  Need for patient self management of healthcare resources, as evidenced by patient reporting  Role Documenting the Problem One  Care  Management [place] for Problem One  Active  [facility] Long Term Goal   Over the next 31 days, patient will become established with new PCP, as evidenced by patient reporting during [place] RN CM outreach  [place] Term Goal Start Date  07/12/18  Interventions for Problem One Long Term Goal  Discussed with patient her multiple concerns with previous PCP and need for newly established PCP,  encouraged patient to follow up with new PCP promptly and to make a personal list of her stated healthcare needs to discuss with new PCP for patient/ PCP review once new patient appointment is completed  [facility] CM Short Term Goal #1   Over the next 30 days, patient will discuss community resource needs and engage with [facility] CSW, as evidenced by patient reporting and collaboration with [facility] CSW during [place] RN CM outreach  [facility] CM Short Term Goal #1 Start Date  07/12/18  Interventions for Short Term Goal #1  Discussed with patient her numerous stated community resource needs/ role of [facility] CSW,  placed [facility] CSW referral and encouraged patient to actively engage with [facility] CSW once outreach is made  [facility] CM Short Term Goal #2   Over the next 30 days, patient will discuss her medication needs/ concerns with [facility] Pharmicist, as evidenced by patient reporting and collaboration with [place] team during [place] RN CM outreach  [facility] CM Short Term Goal #2 Start Date  07/12/18  Interventions for Short Term Goal #2  Discussed with patient medication concerns around not knowing side effects of medications and need to have medications set up with insurance company pharmacy mail services,  placed [place] referral for medication review and further discussion of medication needs,  discussed role of [place] team with patient     I appreciate the opportunity to participate in Jenisse's care,  Oneta Rack, RN, BSN, [employer] Coordinator [facility] Care Management  [phone]

## 2018-07-15 ENCOUNTER — Other Ambulatory Visit: Payer: Self-pay

## 2018-07-15 NOTE — Telephone Encounter (Signed)
NA, mailbox full.

## 2018-07-15 NOTE — Patient Outreach (Signed)
[facility]) Care Management  07/15/2018  Klaudia Beirne [birth year] [id number]   Successful outreach to patient regarding social work referral.  "Please refer [facility] CSW to contact patient next week: Patient referred by IP RN CM for multiple community resource needs, including need for personal care services/ transportation services; financial challenges with reported food insecurity"  Personal Care Services:  Patient reports previously receiving services but she was unhappy with the provider and requested discharge.  BSW and patient conducted three way call to [facility] to inquire about status of eligibility.  Per representative, patient had until 07/06/18 to select another in-home provider.  Patient reports that she was not aware of this.  At this time, a new request form must be completed and patient has to be reassessed.  Patient reports that she "fired" her primary care provider.  L[facility] representative informed patient that form does not have to be completed by PCP.  At the request of patient, [place] representative faxed a request form to [facility] to be completed by her orthopedic provider.  Patient will be contacted by [facility] for the assessment once completed request is received.   Transportation:  Patient utilizes Medicaid transportation for medical appointments but is seeking options for transport for non-medical needs.  BSW talked with patient about [employer] and [employer] which patient was already aware of.  Patient reports having transport scheduled with [employer] for tomorrow.  Patient stated that she was hoping to find a transportation resource that would be more of a one on one situation versus a bus. [employer] is a Midwife that provides one on one transport for non-medical appointments, however, they are only in [place] and [place].  BSW was not able to locate this type of resource in [place].    Food/financial:  Patient reports  being behind on her electricity bill and said that [employer] is the only agency that she aware of that is able to assist.  [place] educated her about [place] 2-1-1.  Patient consented to referral being submitted.  BSW encouraged her to also inquire about other transportation resources when she is contacted.     BSW will follow up with patient in 2-3 weeks regarding status of request for personal care services.   Ronn Melena, BSW Social Worker [phone]

## 2018-07-15 NOTE — Telephone Encounter (Signed)
Please schedule new patient appt with Dr. Derrel Nip.  Ok to schedule per Dr. Derrel Nip.  Please see previous note.

## 2018-07-16 NOTE — Telephone Encounter (Signed)
Spoke with Almyra Free at [place] and they have her in referral que to be called for appointment to be scheduled. Will provide patient with this information and number to call if any further questions.

## 2018-07-16 NOTE — Telephone Encounter (Signed)
Almyra [facility] at [facility] number [phone] provided to patient in previous conversation.

## 2018-07-16 NOTE — Telephone Encounter (Signed)
Unable to lm on vm to set up new patient appt, vb full. 07/16/2018.

## 2018-07-17 ENCOUNTER — Ambulatory Visit: Payer: Medicare Other | Admitting: *Deleted

## 2018-07-17 NOTE — Telephone Encounter (Signed)
No answer/No voicemail. Called other number and it was wrong number so removed from demographics and will try to call again later.

## 2018-07-18 NOTE — Telephone Encounter (Signed)
No answer/Voicemail box is full.  

## 2018-07-23 ENCOUNTER — Telehealth: Payer: Self-pay

## 2018-07-23 NOTE — Telephone Encounter (Signed)
After multiple attempts to reach pt w/o success I have mailed letter to patient to follow up on triage question.   Encounter completed at this time.

## 2018-07-23 NOTE — Telephone Encounter (Signed)
Pt called no answer unable to leave a message due to no voicemail has been set up.

## 2018-07-24 ENCOUNTER — Ambulatory Visit (INDEPENDENT_AMBULATORY_CARE_PROVIDER_SITE_OTHER): Payer: Medicare Other | Admitting: Obstetrics and Gynecology

## 2018-07-24 ENCOUNTER — Encounter: Payer: Self-pay | Admitting: Obstetrics and Gynecology

## 2018-07-24 ENCOUNTER — Other Ambulatory Visit: Payer: Self-pay

## 2018-07-24 VITALS — BP 137/85 | HR 62 | Ht 63.0 in | Wt 141.7 lb

## 2018-07-24 DIAGNOSIS — S86011A Strain of right Achilles tendon, initial encounter: Secondary | ICD-10-CM | POA: Diagnosis not present

## 2018-07-24 DIAGNOSIS — S92132D Displaced fracture of posterior process of left talus, subsequent encounter for fracture with routine healing: Secondary | ICD-10-CM | POA: Diagnosis not present

## 2018-07-24 DIAGNOSIS — M85859 Other specified disorders of bone density and structure, unspecified thigh: Secondary | ICD-10-CM

## 2018-07-24 DIAGNOSIS — N3942 Incontinence without sensory awareness: Secondary | ICD-10-CM | POA: Diagnosis not present

## 2018-07-24 DIAGNOSIS — R140 Abdominal distension (gaseous): Secondary | ICD-10-CM

## 2018-07-24 DIAGNOSIS — N644 Mastodynia: Secondary | ICD-10-CM

## 2018-07-24 DIAGNOSIS — R159 Full incontinence of feces: Secondary | ICD-10-CM

## 2018-07-24 DIAGNOSIS — N952 Postmenopausal atrophic vaginitis: Secondary | ICD-10-CM

## 2018-07-24 DIAGNOSIS — M7061 Trochanteric bursitis, right hip: Secondary | ICD-10-CM | POA: Diagnosis not present

## 2018-07-24 DIAGNOSIS — R152 Fecal urgency: Secondary | ICD-10-CM

## 2018-07-24 LAB — POCT URINALYSIS DIPSTICK
Bilirubin, UA: NEGATIVE
Blood, UA: NEGATIVE
Glucose, UA: NEGATIVE
Ketones, UA: NEGATIVE
Leukocytes, UA: NEGATIVE
Nitrite, UA: NEGATIVE
Protein, UA: NEGATIVE
Spec Grav, UA: <=1.005 — AB (ref 1.010–1.025)
Urobilinogen, UA: 0.2 E.U./dL
pH, UA: 7.5 (ref 5.0–8.0)

## 2018-07-24 MED ORDER — CALCIUM CITRATE-VITAMIN D 315-250 MG-UNIT PO TABS: 1.0000 | ORAL_TABLET | Freq: Every day | ORAL | 6 refills | Status: DC

## 2018-07-24 MED ORDER — ESTRING 2 MG VA RING: 2.0000 mg | VAGINAL_RING | VAGINAL | 4 refills | Status: DC

## 2018-07-24 NOTE — Patient Instructions (Addendum)
Abdominal Bloating When you have abdominal bloating, your abdomen may feel full, tight, or painful. It may also look bigger than normal or swollen (distended). Common causes of abdominal bloating include:  Swallowing air.  Constipation.  Problems digesting food.  Eating too much.  Irritable bowel syndrome. This is a condition that affects the large intestine.  Lactose intolerance. This is an inability to digest lactose, a natural sugar in dairy products.  Celiac disease. This is a condition that affects the ability to digest gluten, a protein found in some grains.  Gastroparesis. This is a condition that slows down the movement of food in the stomach and small intestine. It is more common in people with diabetes mellitus.  Gastroesophageal reflux disease (GERD). This is a digestive condition that makes stomach acid flow back into the esophagus.  Urinary retention. This means that the body is holding onto urine, and the bladder cannot be emptied all the way. Follow these instructions at home: Eating and drinking  Avoid eating too much.  Try not to swallow air while talking or eating.  Avoid eating while lying down.  Avoid these foods and drinks: ? Foods that cause gas, such as broccoli, cabbage, cauliflower, and baked beans. ? Carbonated drinks. ? Hard candy. ? Chewing gum. Medicines  Take over-the-counter and prescription medicines only as told by your health care provider.  Take probiotic medicines. These medicines contain live bacteria or yeasts that can help digestion.  Take coated peppermint oil capsules. Activity  Try to exercise regularly. Exercise may help to relieve bloating that is caused by gas and relieve constipation. General instructions  Keep all follow-up visits as told by your health care provider. This is important. Contact a health care provider if:  You have nausea and vomiting.  You have diarrhea.  You have abdominal pain.  You have unusual  weight loss or weight gain.  You have severe pain, and medicines do not help. Get help right away if:  You have severe chest pain.  You have trouble breathing.  You have shortness of breath.  You have trouble urinating.  You have darker urine than normal.  You have blood in your stools or have dark, tarry stools. Summary  Abdominal bloating means that the abdomen is swollen.  Common causes of abdominal bloating are swallowing air, constipation, and problems digesting food.  Avoid eating too much and avoid swallowing air.  Avoid foods that cause gas, carbonated drinks, hard candy, and chewing gum. This information is not intended to replace advice given to you by your health care provider. Make sure you discuss any questions you have with your health care provider. Document Released: 02/04/2016 Document Revised: 04/22/2018 Document Reviewed: 02/04/2016 Elsevier Patient Education  [place].  Breast Tenderness Breast tenderness is a common problem for women of all ages. Breast tenderness may cause mild discomfort to severe pain. The pain usually comes and goes in association with your menstrual cycle, but it can be constant. Breast tenderness has many possible causes, including hormone changes and some medicines. Your health care provider may order tests, such as a mammogram or an ultrasound, to check for any unusual findings. Having breast tenderness usually does not mean that you have breast cancer. Follow these instructions at home: Sometimes, reassurance that you do not have breast cancer is all that is needed. In general, follow these home care instructions: Managing pain and discomfort   If directed, apply ice to the area: ? Put ice in a plastic bag. ?  Place a towel between your skin and the bag. ? Leave the ice on for 20 minutes, 2-3 times a day.  Make sure you are wearing a supportive bra, especially during exercise. You may also want to wear a supportive bra  while sleeping if your breasts are very tender. Medicines  Take over-the-counter and prescription medicines only as told by your health care provider. If the cause of your pain is infection, you may be prescribed an antibiotic medicine.  If you were prescribed an antibiotic, take it as told by your health care provider. Do not stop taking the antibiotic even if you start to feel better. General instructions   Your health care provider may recommend that you reduce the amount of fat in your diet. You can do this by: ? Limiting fried foods. ? Cooking foods using methods, such as baking, boiling, grilling, and broiling.  Decrease the amount of caffeine in your diet. You can do this by drinking more water and choosing caffeine-free options.  Keep a log of the days and times when your breasts are most tender.  Ask your health care provider how to do breast exams at home. This will help you notice if you have an unusual growth or lump. Contact a health care provider if:  Any part of your breast is hard, red, and hot to the touch. This may be a sign of infection.  You are not breastfeeding and you have fluid, especially blood or pus, coming out of your nipples.  You have a fever.  You have a new or painful lump in your breast that remains after your menstrual period ends.  Your pain does not improve or it gets worse.  Your pain is interfering with your daily activities. This information is not intended to replace advice given to you by your health care provider. Make sure you discuss any questions you have with your health care provider. Document Released: 12/16/2007 Document Revised: 12/15/2016 Document Reviewed: 10/01/2015 Elsevier Patient Education  2020 [employer].

## 2018-07-24 NOTE — Progress Notes (Signed)
GYNECOLOGY PROGRESS NOTE  Subjective:    Patient ID: Angela [name], female    DOB: October 26, 1950, 68 y.o.   MRN: [id number]  HPI  Patient is a 68 y.o. [id number] female who presents for complaints of breast and vaginal pain x 1 year.  Notes urinary incontinence (and occasional fecal incontinence) for the past several months, denies any strong urge, dysuria or leakage with change of positions. Reports that it happens sporadically and without warning.  Also noting some fatigue. Thinks it may be due to her chemistry being off (states she has a h/o hyponatremia.  Patient also desires to review her labs and mammogram and Dexa scan results as she notes that she has not received a phone call about her results from her PCP. Had labs and imaging done 2 weeks ago. Is also concerned regarding abdominal bloating.  Notes that she acknowledges that she has gained some weight, however thinks the bloating is a separate issue.   Patient has a h/o vaginal atrophy, has been prescribed Premarin cream in the past. Notes very inconsistent use over the past 6 months  The following portions of the patient's history were reviewed and updated as appropriate: allergies, current medications, past family history, past medical history, past social history, past surgical history and problem list.  Review of Systems Pertinent items noted in HPI and remainder of comprehensive ROS otherwise negative.   Objective:   Blood pressure 137/85, pulse 62, height 5\' 3"  (1.6 m), weight 141 lb 11.2 oz (64.3 kg). General appearance: alert and no distress  Breasts: breasts appear normal, no suspicious masses, no skin or nipple changes or axillary nodes.  Mild tenderness noted near nipples bilaterally.  Abdomen: soft, non-tender; bowel sounds normal; no masses,  no organomegaly Pelvic: external genitalia normal, rectovaginal septum normal.  Uretheral caruncle noted. Vagina with moderate vaginal atrophy. No vaginal discharge.  Cervix  normal appearing, no lesions and no motion tenderness.  Uterus mobile, nontender, normal shape and size.  Adnexae non-palpable, nontender bilaterally.  Extremities: extremities normal, atraumatic, no cyanosis or edema Neurologic: Grossly normal    Labs:  Results for orders placed or performed in visit on 07/24/18  POCT urinalysis dipstick  Result Value Ref Range   Color, UA yellow    Clarity, UA clear    Glucose, UA Negative Negative   Bilirubin, UA neg    Ketones, UA neg    Spec Grav, UA <=1.005 (A) 1.010 - 1.025   Blood, UA neg    pH, UA 7.5 5.0 - 8.0   Protein, UA Negative Negative   Urobilinogen, UA 0.2 0.2 or 1.0 E.U./dL   Nitrite, UA neg    Leukocytes, UA Negative Negative   Appearance yellow    Odor      Admission on 07/11/2018, Discharged on 07/11/2018  Component Date Value Ref Range Status  . Sodium 07/11/2018 135  135 - 145 mmol/L Final  . Potassium 07/11/2018 3.6  3.5 - 5.1 mmol/L Final  . Chloride 07/11/2018 102  98 - 111 mmol/L Final  . CO2 07/11/2018 26  22 - 32 mmol/L Final  . Glucose, Bld 07/11/2018 103* 70 - 99 mg/dL Final  . BUN 07/11/2018 14  8 - 23 mg/dL Final  . Creatinine, Ser 07/11/2018 0.53  0.44 - 1.00 mg/dL Final  . Calcium 07/11/2018 8.9  8.9 - 10.3 mg/dL Final  . GFR calc non Af Amer 07/11/2018 >60  >60 mL/min Final  . GFR calc Af Amer 07/11/2018 >60  >60  mL/min Final  . Anion gap 07/11/2018 7  5 - 15 Final   Performed at [facility], [address] [place], [place] [address]  . WBC 07/11/2018 4.9  4.0 - 10.5 K/uL Final  . RBC 07/11/2018 3.89  3.87 - 5.11 MIL/uL Final  . Hemoglobin 07/11/2018 12.0  12.0 - 15.0 g/dL Final  . HCT 07/11/2018 35.5* 36.0 - 46.0 % Final  . MCV 07/11/2018 91.3  80.0 - 100.0 fL Final  . MCH 07/11/2018 30.8  26.0 - 34.0 pg Final  . MCHC 07/11/2018 33.8  30.0 - 36.0 g/dL Final  . RDW 07/11/2018 14.2  11.5 - 15.5 % Final  . Platelets 07/11/2018 255  150 - 400 K/uL Final  . nRBC 07/11/2018 0.0  0.0 - 0.2 %  Final   Performed at [facility], [address] [place], [place] [address]  . Troponin I (High Sensitivity) 07/11/2018 <2  <18 ng/L Final   Comment: (NOTE) Elevated high sensitivity troponin I (hsTnI) values and significant  changes across serial measurements may suggest ACS but many other  chronic and acute conditions are known to elevate hsTnI results.  Refer to the "Links" section for chest pain algorithms and additional  guidance. Performed at [facility], [address] [place], [place] [address]   . Total Protein 07/11/2018 6.5  6.5 - 8.1 g/dL Final  . Albumin 07/11/2018 3.8  3.5 - 5.0 g/dL Final  . AST 07/11/2018 25  15 - 41 U/L Final  . ALT 07/11/2018 22  0 - 44 U/L Final  . Alkaline Phosphatase 07/11/2018 95  38 - 126 U/L Final  . Total Bilirubin 07/11/2018 0.3  0.3 - 1.2 mg/dL Final  . Bilirubin, Direct 07/11/2018 <0.1  0.0 - 0.2 mg/dL Final  . Indirect Bilirubin 07/11/2018 NOT CALCULATED  0.3 - 0.9 mg/dL Final   Performed at [facility], [address] [place], [place] [address]  . Color, Urine 07/11/2018 STRAW* YELLOW Final  . APPearance 07/11/2018 CLEAR* CLEAR Final  . Specific Gravity, Urine 07/11/2018 1.002* 1.005 - 1.030 Final  . pH 07/11/2018 7.0  5.0 - 8.0 Final  . Glucose, UA 07/11/2018 NEGATIVE  NEGATIVE mg/dL Final  . Hgb urine dipstick 07/11/2018 NEGATIVE  NEGATIVE Final  . Bilirubin Urine 07/11/2018 NEGATIVE  NEGATIVE Final  . Ketones, ur 07/11/2018 NEGATIVE  NEGATIVE mg/dL Final  . Protein, ur 07/11/2018 NEGATIVE  NEGATIVE mg/dL Final  . Nitrite 07/11/2018 NEGATIVE  NEGATIVE Final  . [place] 07/11/2018 NEGATIVE  NEGATIVE Final  . RBC / HPF 07/11/2018 0-5  0 - 5 RBC/hpf Final  . WBC, UA 07/11/2018 0-5  0 - 5 WBC/hpf Final  . Bacteria, UA 07/11/2018 NONE SEEN  NONE SEEN Final  . Squamous Epithelial / LPF 07/11/2018 0-5  0 - 5 Final   Performed at [facility], [address], [place], [place] [address]  .  Tricyclic, Ur Screen 37/90/2409 NONE DETECTED  NONE DETECTED Final  . Amphetamines, Ur Screen 07/11/2018 NONE DETECTED  NONE DETECTED Final  . MDMA (Ecstasy)Ur Screen 07/11/2018 NONE DETECTED  NONE DETECTED Final  . Cocaine Metabolite,Ur [place] 07/11/2018 NONE DETECTED  NONE DETECTED Final  . Opiate, Ur Screen 07/11/2018 NONE DETECTED  NONE DETECTED Final  . Phencyclidine (PCP) Ur S 07/11/2018 NONE DETECTED  NONE DETECTED Final  . Cannabinoid 50 Ng, Ur [place] 07/11/2018 NONE DETECTED  NONE DETECTED Final  . Barbiturates, Ur Screen 07/11/2018 NONE DETECTED  NONE DETECTED Final  . Benzodiazepine,  Ur Scrn 07/11/2018 NONE DETECTED  NONE DETECTED Final  . Methadone Scn, Ur 07/11/2018 NONE DETECTED  NONE DETECTED Final   Comment: (NOTE) Tricyclics + metabolites, urine    Cutoff 1000 ng/mL Amphetamines + metabolites, urine  Cutoff 1000 ng/mL MDMA (Ecstasy), urine              Cutoff 500 ng/mL Cocaine Metabolite, urine          Cutoff 300 ng/mL Opiate + metabolites, urine        Cutoff 300 ng/mL Phencyclidine (PCP), urine         Cutoff 25 ng/mL Cannabinoid, urine                 Cutoff 50 ng/mL Barbiturates + metabolites, urine  Cutoff 200 ng/mL Benzodiazepine, urine              Cutoff 200 ng/mL Methadone, urine                   Cutoff 300 ng/mL The urine drug screen provides only a preliminary, unconfirmed analytical test result and should not be used for non-medical purposes. Clinical consideration and professional judgment should be applied to any positive drug screen result due to possible interfering substances. A more specific alternate chemical method must be used in order to obtain a confirmed analytical result. Gas chromatography / mass spectrometry (GC/MS) is the preferred confirmat                          ory method. Performed at [facility], [address] [place], [place] [address]      Assessment:  Urinary incontinence without sensory awareness Breast tenderness in  female  Abdominal bloating  Vaginal atrophy Incontinence of feces with fecal urgency Osteopenia  Plan:   1. Patient reports urinary incontinence (and sometimes fecal incontinence). Denies urge or stress component. Discussed that she may be a candidate for pelvic floor physical therapy, pessary. Encouraged use of Kegel exercises. Patient to consider options. UA negative today.  2. Breast tenderness of unknown cause. Patient with recent normal mammogram.  May possible be hormonal (or even dietary). Discussed limiting caffeine intake if applicable, use of ice packs.  Patient is menopausal, not very complaint with her local hrt thearpy (Premarin cream), so low hormonal exposure. If continues to note, will check hormone (estrogen and progesterone levels).  3. Abdominal bloating, no physical causes identified on today's exam. Discussed diet with patient. Continue to monitor. Notes her colonoscopy is to be scheduled for later in the year.  4. Vaginal atrophy (with urethral caruncle noted). Inconsistent with use of Premarin cream. Notes that she would like to try the vaginal ring (Estring).  Will order.  5. Incontinence - patient with urinary incontinence and fecal urgency (with occasional incontinence). Discussed use of Kegel exercises, monitoring diet intake. Encouraged strong gwork on Kegel exercises. Will re-evaluate next visit. Offered referral to pelvic floor physical therapy.  5. Reviewed labs and ultrasound with patient. Printed copy for patient to have for personal reference. Patient is osteopenic (T score -1.8). Continued to encourage use of Vitamin D and calcium (patient would like a prescription for new calcium pills due to GI tolerance issues). Will prescribe.   RTC in 1 month for placement of Estring vaginal ring. Prescription given. Patient will resume use of Premarin cream until next appointment.    A total of 25 minutes were spent face-to-face with the patient during this encounter and  over half of that time involved counseling and coordination of care.   Rubie Maid, MD [facility]

## 2018-07-24 NOTE — Progress Notes (Signed)
Pt is present for breast/vaginal soreness x 1 year.

## 2018-07-25 ENCOUNTER — Encounter: Payer: Self-pay | Admitting: *Deleted

## 2018-07-25 ENCOUNTER — Other Ambulatory Visit: Payer: Self-pay | Admitting: *Deleted

## 2018-07-25 NOTE — Patient Outreach (Signed)
[place] [facility]) Care Management [place] Telephone Outreach  07/25/2018  Angela [name] May 03, 1950 [id number]  Successful telephone outreach to Angela [name], 68 y/o female patient referred to [place] by inpatient hospital CSW for community resource needs after patient had ED visit yesterday for atypical chest pain.  Patient was discharged home with home health services [facility] post- ED visit yesterday.  Patient has history including, but not limited to: anxiety/ depression/ bipolar; chronic bronchitis; HTN; GERD.  HIPAA/ identity verified with patient during phone call today.    Today, patient stated that she "got bad news yesterday" at her scheduled orthopedic provider office visit; states that she has a fractured ankle and is now having to limit weight bearing; states she continues using cane/ walker and will follow up regularly with orthopedic provider for ongoing monitoring of fractured ankle.  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  Reports moderate pain that is releived with medication and immobility as instructed.  Patient sounds to be in no distress throughout 45 minute phone call today.  Patient further reports:  Medications: -- again reports has all medicationsand takes as prescribed;denies recent changes to medications as a result of this week's provider office visits.  Again declines medication review today stating she "doesn't feel up to it," and again stated that she has specific questions about side effects of her medications that she would like to discuss with a pharmacist. -- Confirmed for patient  that I had placed referral to [place] team to address her request, and encouraged her to listen for outreach call and to engage when outreach is attempted- patient verbalizes agreement -- patient again reports that she has a "nebulizer from years ago" that is broken which she would like to have replaced; confirms that she has not used broken nebulizer recently and again stated that she has not contacted her pulmonary provider to inquire about need for this.  Noted that patient has a scheduled pulmonary provider office visit and encouraged her to discuss need to replace her old nebulizer with provider at that time.  Home health [facility]) services: -- again discussed that [facility] services were placed at time of ED visit 07/11/2018 by inpatient RN CM through [facility]-- today, patient states she "knew nothing about this," and I gently reminded her that we had discussed this with initial outreach call 07/12/2018, when she declined accepting assistance/ contact information for home health agency stating she would rather wait for them to contact her. -- I again offered to provide contact information for home health agency that was referred at time of ED visit on 07/11/2018- patient again declines taking this information.  States "as long as it isn't [facility] agency I don't care." -- discussed with patient that I would place care coordination outreach call to home health agency to follow up on ED referral from 07/11/2018, and she is agreeable to this.  Provider appointments: -- Recent and upcoming provider appointments were reviewed with patient today-- noted through review of EMR that Dr. Derrel [name] at [facility] attempted to outreach patient to schedule scheduling of initial new  patient office visit, but was unable to contact her on listed phone numbers in EMR. -- Patient very upset when I reviewed her phone numbers listed in EMR- stated she didn't provide the incorrect phone number and she wants to know  how and why this number is listed in EMR, as it is not now nor "ever has been" one of her phone numbers: discussed with patient that I would take steps to correct the information in the EMR and would reach out to PCP to make them aware/ re-attempt call to schedule initial patient office visit to be established with Dr. Derrel [name]- patient is appreciative and in agreement; she declines taking PCP office number stating she would rather that I just handle this for her.  [facility] Community CM Telephone Outreach Care Coordination: contacted [facility] CMA to remove incorrect phone number from EMR as requested by patient; this was completed  [employer] needs: -- confirms that she has spoken to [facility]; encouraged patient to actively engage with [employer] around her previously stated community resource needs; patient reports that yesterday during office visit, her orthopedic provider completed the forms necessary for personal care services through medicaid  -- confirms ongoing transportation through medicaid to provider appointments -- states that she has begun meeting people in her relatively new apartment complex and that they all had a cook out and that she "likes her new neighbors and her new apartment," but the "people who run the place are horrible;" states they 'are always rude and don't want to do anything for the residents."  Advanced Directive Planning: -- patient currently denies having Living Will/ HCPOA in place and endorses that she would like to have full resuscitation should she need resuscitation; initially declines my placing educational material in mail to her and we briefly discussed the basics around Advanced Directive planning- patient is eventually agreeable to my placing this information in the mail to her and she again requests that it be mailed to a friends home address, as her mail at her new apartment complex is "very unreliable."  Patient declines having her address changed in EMR to  this address she provides to me today:  [place], [place] [address]  Self-health management of chronic bronchitis/ overall health: -- patient reports that her breathing status "is about the same;" states she has "terrible breathing problems all the time," but she denies need to use her broken nebulizer; denies recent flare up of bronchitis and patient sounds to be in no distress while talking rapidly for long periods of time -- again denies need for home oxygen; states her "medications help" -- we discussed basic action plan for bronchitis flares, including adherence to medications and promptly notifying care/ pulmonary provider for any new concerns/ issues/ problems that arise- encouraged patient to promptly notify care providers at time of noticing symptoms, patient states she "knows that" 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 I confirmed that patient hasmy direct phone number, the main [place] office phone number, and the [facility] CM 24-hour nurse advice phone number should issues arise prior to next scheduled [place] outreach by phone next week to update patient on care coordination outreaches that will be placed as a result of today's phone call with patient.  Encouraged patient to contact me directly if needs, questions, issues, or concerns arise prior to next scheduled outreach; patient agreed to do so.  Plan:  Patient will  take medications as prescribed and will attend all scheduled provider appointments  Patient will promptly schedule office visit with newly acquired PCP; I will place care coordination outreach to facilitate this  Patient will promptly notify care providers for any new concerns/ issues/ problems that arise  I will place care coordination outreach to follow up on status of home health services as ordered post-ED discharge 07/11/2018  I will share today's [place]  notes/ care plan with patient's PCP as initial assessment  Patient will actively engage with [place] and [place] teams around her stated requests for community resource and medication review assistance  I will place printed educational material in mail to patient around [place] outreach to continue with scheduled phone in next week to update patient on care coordination outreaches placed as a result of today's phone call  [place] Problem One     Most Recent Value  Care Plan Problem One  Need for patient self management of healthcare resources, as evidenced by patient reporting  Role Documenting the Problem One  Care Management Coordinator  Care Plan for Problem One  Active  THN Long Term Goal   Over the next 31 days, patient will become established with new PCP, as evidenced by patient reporting during [place] RN CM outreach  [facility] Long Term Goal Start Date  07/12/18  Interventions for Problem One Long Term Goal  Discussed with patient progress on obtaining new PCP,  confirmed that patient has not yet heard form PCP office,  discussed review of EMR with patient that several care providers have had difficulty contacting her using listed phone numbers in EMR,  completed care coordination outreach to [place] to have incorrect phone number for patient removed from EMR,  discussed with patient that I would place care coordination outreach to new PCP, informing of accurate patient phone number and would ask that new PCP re-attempt call to patient to schedule initial new patient office visit  THN CM Short Term Goal #1   Over the next 30 days, patient will discuss community resource needs and engage with [facility] CSW, as evidenced by patient reporting and collaboration with [facility] CSW during [place] RN CM outreach  [facility] CM Short Term Goal #1 Start Date  07/12/18  Interventions for Short Term Goal #1  Confirmed that patient has spoken with [facility] BSW and  encouraged patient to maintain contact with [facility] BSW,  confirmed that patient reported that her orthopedic provider has submitted the necessary forms she needs for personal care services yesterday during office visit  THN CM Short Term Goal #2   Over the next 30 days, patient will discuss her medication needs/ concerns with [facility] Pharmicist, as evidenced by patient reporting and collaboration with [place] team during [place] RN CM outreach  [facility] CM Short Term Goal #2 Start Date  07/12/18  Interventions for Short Term Goal #2  Confirmed that patient has no current concerns around medications,  encouraged her to engage with [facility] Pharmacist for medication review, as she has requested     Oneta Rack, RN, BSN, [employer] Coordinator [facility] Care Management  [phone]

## 2018-07-26 ENCOUNTER — Other Ambulatory Visit: Payer: Self-pay | Admitting: *Deleted

## 2018-07-26 DIAGNOSIS — M3501 Sicca syndrome with keratoconjunctivitis: Secondary | ICD-10-CM | POA: Diagnosis not present

## 2018-07-26 LAB — URINE CULTURE

## 2018-07-26 NOTE — Patient Outreach (Addendum)
[place] [facility]) Care Management [place] Telephone [facility] Coordination  07/26/2018  [name] 02-Jan-1951 [id number]  [place] Telephone Outreach Care Coordination re:  Angela [name], 68 y/o femalepatient referred to [place] by inpatient hospital CSW for community resource needs after patient had ED visit yesterday for atypical chest pain. Patient was discharged home with home health services [facility] post- ED visit yesterday. Patient has history including, but not limited to: anxiety/ depression/ bipolar; chronic bronchitis; HTN; GERD.   11:05: [facility] [phone]) to follow up on status of recently placed home health orders for patient, placed 07/11/2018 during ED visit; inpatient RN CM placed referral and patient reported yesterday that she had not yet heard from home health agency.  Spoke with Anderson [place] at [facility], who stated that clinical director Tamsen Meek would need to address follow up; left [place] voice message with my direct call back information, requesting follow up information around referral placed on 07/11/2018  1:55 pm:  Received call back form Tamsen Meek, Clinical Director for [facility] agency; Jenny Reichmann updates me today that she is unable to locate referral placed at time of patient's recent ED visit on 07/11/2018- Jenny Reichmann shared that she would follow up on this referral and would contact me back after she has additional information.  Made Cindy aware that patient is attempting to obtain new office visit appointment with new PCP Dr. [name].  Plan:  Will continue collaboration as indicated with [facility] around status of referral placed 07/11/2018 during ED visit  4:20 pm:  Received secure voice message from Tamsen Meek, Clinical Director for [facility] regarding above conversations earlier today.  Jenny Reichmann confirmed that she spoke with their agency intake  nurse, who confirmed that she DID successfully contact patient to initiate home health services as ordered post- ED visit; intake referral nurse did not process the referral, as patient adamantly declined initiation of services when she contacted patient.  Dr. Derrel Nip updated/ made aware of this follow up through secure messaging via EMR.  Oneta Rack, RN, BSN, [employer] [facility] Care Management  [phone]

## 2018-07-26 NOTE — Patient Outreach (Signed)
[place] [facility]) Care Management [place] Telephone [facility] Coordination  07/26/2018  Angela [name] 19-Mar-1950 [id number]  [place] Telephone Outreach Care Coordination re:  [name], 68 y/o femalepatient referred to [place] by inpatient hospital CSW for community resource needs after patient had ED visit yesterday for atypical chest pain. Patient was discharged home with [facility] health services [facility] post- ED visit 07/11/2018. Patient has history including, but not limited to: anxiety/ depression/ bipolar; chronic bronchitis; HTN; GERD.   2:05 pm: placed attempts x 2 to contact [place] RN Kerin Salen to follow up on conversation with patient yesterday, and to update on patient's accurate phone number and care coordination efforts with [facility] (referral placed at time of recent ED visit 07/11/2018; both attempt placed to contact PCP practice unsuccessful; phone rang multiple times without physical or voice mail pick up when attempting direct call to [facility]; second attempt to main practice line was unanswered after extended hold for > 10 minutes.  Plan:  Will send Juliann Pulse secure message through EMR, making her aware of above and will collaborate with PCP team as indicated.  Oneta Rack, RN, BSN, [employer] [facility] Care Management  [phone]

## 2018-07-29 ENCOUNTER — Other Ambulatory Visit: Payer: Self-pay | Admitting: Pharmacist

## 2018-07-29 ENCOUNTER — Ambulatory Visit: Payer: Self-pay

## 2018-07-29 ENCOUNTER — Encounter: Payer: Self-pay | Admitting: *Deleted

## 2018-07-29 ENCOUNTER — Other Ambulatory Visit: Payer: Self-pay

## 2018-07-29 ENCOUNTER — Other Ambulatory Visit: Payer: Self-pay | Admitting: *Deleted

## 2018-07-29 NOTE — Patient Outreach (Signed)
[place] [facility]) Care Management  07/29/2018  Angela [name] 1950-06-25 [id number]   Per note from [facility], Reginia Naas, on 07/26/18 patient reported that request for personal care services was faxed to [facility] during her recent orthopedic appointment.   BSW attempted to reach her today to determine if she has been contacted by [employer] for the purpose of the assessment.  Patient did not answer and voicemail box was full.  Will attempt to reach her again within four business days.  Ronn Melena, BSW Social Worker [phone]

## 2018-07-29 NOTE — Patient Outreach (Addendum)
[place] [facility]) Care Management  [place]   07/29/2018  Karlye Ihrig [birth year] [id number]  Reason for referral: Medication Review, help with obtaining new nebulizer machine  Referral source: [facility] RN Current insurance: [facility]  PMHx includes but not limited to:  Bipolar 1 disorder, PTSD, personality disorder, hx medication non-compliance, thyroid disease, GERD, IBS, neuropathy, migraine, former smoker with chronic bronchitis and mucus hypersecretion, HTN, hx TIA.  Multiple allergies also noted.    Noted recent pulmonary visit with change in therapy from [place] --> Incruse, TIW azithromycin, PRN levalbuterol neb or albuterol inhaler.   Also recent ED visit for chest pain, HA, LLE swelling, CT neg for PE, LLE u/s negative, CT head neg, chest xray neg.    Outreach:  Unsuccessful telephone call attempt #1 to patient. Unable to leave message  Plan:  -I will mail patient an unsuccessful outreach letter.  -I will make another outreach attempt to patient within 3-4 business days.    Ralene Bathe, PharmD, [place] [phone]

## 2018-07-29 NOTE — Patient Outreach (Signed)
[place] [facility]) Care Management  07/29/2018  Sora Vrooman 1950-09-25 [id number]   [place] attempted outreach today to pt and was unsuccessful. CSW will send pt an outreach letter and try again in 3-4 business days.  CSW was briefed on pt needs per Reginia Naas, [place].    Eduard Clos, MSW, [place] Worker  [place] [phone]

## 2018-07-30 ENCOUNTER — Telehealth: Payer: Self-pay | Admitting: Family Medicine

## 2018-07-30 ENCOUNTER — Encounter: Payer: Self-pay | Admitting: Podiatry

## 2018-07-30 ENCOUNTER — Other Ambulatory Visit: Payer: Self-pay

## 2018-07-30 ENCOUNTER — Ambulatory Visit (INDEPENDENT_AMBULATORY_CARE_PROVIDER_SITE_OTHER): Payer: Medicare Other | Admitting: Podiatry

## 2018-07-30 DIAGNOSIS — B351 Tinea unguium: Secondary | ICD-10-CM | POA: Diagnosis not present

## 2018-07-30 DIAGNOSIS — M79672 Pain in left foot: Secondary | ICD-10-CM | POA: Diagnosis not present

## 2018-07-30 DIAGNOSIS — M79671 Pain in right foot: Secondary | ICD-10-CM | POA: Diagnosis not present

## 2018-07-30 DIAGNOSIS — M79676 Pain in unspecified toe(s): Secondary | ICD-10-CM | POA: Diagnosis not present

## 2018-07-30 DIAGNOSIS — G8929 Other chronic pain: Secondary | ICD-10-CM

## 2018-07-30 NOTE — Telephone Encounter (Signed)
Patient needs new patient appointment to establish with Dr. Derrel Nip.

## 2018-07-30 NOTE — Telephone Encounter (Signed)
Tried to call pt to set up a new patient appt with Dr. Derrel Nip. Unable to lm to call office, voice box was full.

## 2018-07-30 NOTE — Telephone Encounter (Signed)
-----   Message from Knox Royalty, RN sent at 07/26/2018  2:41 PM EDT ----- [place].... Please see attached [place] CM note/ care plan from call to patient yesterday as initial assessment.  I have updated the phone number for the patient in EPIC, and she would like for your team to re-contact her at correct phone number to schedule appointment to establish Dr. Derrel Nip as her new PCP. Patient's correct (and only) phone number is:  [phone]  I sent you both separate in-basket message as well--  As an FYI, there was a referral placed during recent ED visit 07/11/2018 for home health services [facility] and I spoke with their clinical director today, who reports that the referral has not yet been initiated; [facility] may be contacting you about this.  Please let me know if you have any additional questions, or I can be of assistance,  Oneta Rack, RN, BSN, [place] Coordinator [facility] Care Management  [phone]

## 2018-07-31 ENCOUNTER — Encounter: Payer: Self-pay | Admitting: *Deleted

## 2018-07-31 ENCOUNTER — Other Ambulatory Visit: Payer: Self-pay

## 2018-07-31 ENCOUNTER — Ambulatory Visit: Payer: Self-pay

## 2018-07-31 ENCOUNTER — Other Ambulatory Visit: Payer: Self-pay | Admitting: *Deleted

## 2018-07-31 NOTE — Patient Outreach (Addendum)
[name]) Care Management [place] Telephone Outreach Unsuccessful consecutive telephone outreach attempt #1- previously engaged patient  07/31/2018  [name] 11/11/1950 [id number]  12:10 pm: Unsuccessful telephone outreach to Angela [name], 68 y/o femalepatient referred to [place] by inpatient hospital CSW for community resource needs after patient had ED visit yesterday for atypical chest pain. Patient was discharged home with home health services [facility] post- ED visit, which patient declined when home health agency contacted her to establish services. Patient has history including, but not limited to: anxiety/ depression/ bipolar; chronic bronchitis; HTN; GERD.   Unable to leave patient voice message requesting call back, as her automated outgoing voice messaging states that her mail box is full.  Plan:  Will re-attempt [facility] Community CM telephone outreach tomorrow if I do not hear back from patient first.  Oneta Rack, RN, BSN, [place] Coordinator [facility] Care Management  [phone]

## 2018-07-31 NOTE — Patient Outreach (Signed)
[place] [facility]) Care Management  07/31/2018  Angela [name] Aug 01, 1950 [id number]  Second attempt to reach patient to determine if she has been contacted by [employer] for the purpose of the pcs assessment.  Patient did not answer and voicemail box was full.  Will attempt to reach her again within four business days.  Ronn Melena, BSW Social Worker [phone]

## 2018-08-01 ENCOUNTER — Other Ambulatory Visit: Payer: Self-pay

## 2018-08-01 ENCOUNTER — Encounter: Payer: Self-pay | Admitting: *Deleted

## 2018-08-01 ENCOUNTER — Other Ambulatory Visit: Payer: Self-pay | Admitting: *Deleted

## 2018-08-01 NOTE — Patient Outreach (Addendum)
[facility] [facility]) Care Management [place] Telephone Outreach Second consecutive unsuccessful outreach attempt- previously engaged patient  08/01/2018  Angela [name] July 29, 1950 [id number]  Unsuccessful telephone outreach to [name], 68 y/o femalepatient referred to [place] by inpatient hospital CSW for community resource needs after patient had ED visit yesterday for atypical chest pain. Patient was discharged home with [facility] post- recent ED visit, which patient refused when home health agency contacted her. Patient has history including, but not limited to: anxiety/ depression/ bipolar; chronic bronchitis; HTN; GERD.   I was again unable to reach patient today to follow up/ update patient on previously placed care coordination efforts- I again received automated outgoing voice message for patient stating that her voice mail box is full.  Unable to leave patient voice message requesting call back due to her voice mail box being full.  Plan:  Will place [facility] Community CM unsuccessful patient outreach letter in mail requesting call back in writing, as this is the second unsuccessful attempt to contact patient due to her voice mail box being full  Will re-attempt [place] telephone outreach again tomorrow if I do not hear back from patient first.  Oneta Rack, RN, BSN, [employer] Coordinator [facility] Care Management  [phone]

## 2018-08-01 NOTE — Patient Outreach (Signed)
[place] [facility]) Care Management  08/01/2018  Angela [name] Jul 17, 1950 [id number]   BSW received return call from patient.  Patient reported that she did not receive the list of in-home providers that was sent.  BSW reminded her that she requested for this to be sent via email and that it was sent on 07/16/18.  Patient reported that she has been having difficulty with her phone and accessing email.  She requested that the list be faxed and she provided a fax number. Patient also inquired about counseling services in the [place] area and requested that a list of providers be faxed.  BSW faxed Home Care Directory as well as list of counselors from [employer].  Patient reports that she is scheduled for an assessment for [place] on 08/05/18.  BSW will follow up with her after this assessment regarding determination.   Ronn Melena, BSW Social Worker [phone]

## 2018-08-02 ENCOUNTER — Telehealth: Payer: Self-pay | Admitting: Internal Medicine

## 2018-08-02 ENCOUNTER — Other Ambulatory Visit: Payer: Self-pay | Admitting: *Deleted

## 2018-08-02 ENCOUNTER — Encounter: Payer: Self-pay | Admitting: *Deleted

## 2018-08-02 ENCOUNTER — Other Ambulatory Visit: Payer: Self-pay | Admitting: Pharmacist

## 2018-08-02 ENCOUNTER — Ambulatory Visit: Payer: Self-pay | Admitting: Pharmacist

## 2018-08-02 NOTE — Patient Outreach (Signed)
[facility]) Care Management [place] Telephone Outreach  08/02/2018  Scotland Dost 08/17/1950 [id number]  Successful telephone outreach to [name], 68 y/o femalepatient referred to [place] by inpatient hospital CSW for community resource needs after patient had ED visit yesterday for atypical chest pain. Patient was discharged home with home health services through [facility] post- ED visit, which patient later refused, according to home health agency. Patient has history including, but not limited to: anxiety/ depression/ bipolar; chronic bronchitis; HTN; GERD. HIPAA/ identity verified with patient during phone call today.   Today, patient stated that my call woke her up and she initially does not wish to provide HIPAA identifiers; explained that this information needs to be verified with each outreach to patient; eventually she provides this information.  Patient is extremely argumentative throughout call.  Patient states that she believes she is coming "down with a sinus infection;" I encouraged her to promptly seek health care through urgent care visit, or ED as needed.  Patient stated that she "already has antibiotics" which she is using; patient does not provide information around where she obtained antibiotics.  Patient sounds to be in no distress throughout phone call today.  I explained to patient that I was calling her today to update her on previous care coordination efforts taken to address her stated needs:  -- had her incorrect phone number changed in EMR: explained to patient that it appears several team members and her PCP have all attempted to contact her as she requested; explained that it appears from documentation that they have been unable to reach her and unable to leave voice message on her phone; patient became very angry about this and stated that she does not believe that any one has attempted to contact her; I  provided the numbers for all [facility] team members so she would have them and encouraged her to contact them; however, patient stated that she "doesn't know if" she will be able to do or not.  Patient continually states that she "doesn't believe anyone has tried to call her."  I encouraged her to contact team members and to clear her voice mail out as well.  -- confirms that she has an appointment with [facility] on Monday 08/09/2018 to discuss options around personal care services  -- contacted home health agency around referral placed at time of ED visit 07/11/18: updated patient information that was provided by home health agency team that when they contacted patient, she refused services and stated no needs.  Patient became irate and screamed in phone that "they are lying;" and I explained that I am simply sharing the information that was provided by their agency.  I also explained that they are unable to process the order from the ED visit, as it is no longer valid and that if another order is generated it would need to be ordered by PCP-- again encouraged patient to contact PCP office to schedule a new patient office visit to establish care.  I provided patient the number to [facility] upon patient's request.  -- confirmed with patient that I had mailed the Advanced Directive planning packet to the address she requested I do so- patient stated that she has not yet received mail form her friend's home where it was mailed to; encouraged patient to obtain and review at her earliest convenience.  Patient denies further issues, concerns, or problems today and she remained argumentative and confrontational throughout phone call, stating  that she thinks that the "health care in [place] sucks and everyone in medicine here just tells lie after lie."  Patient continually yelled into phone and provided multiple instances from her past where she believes her health care providers lied to her.  I confirmed that patient has  my direct phone number, the main [facility] CM office phone number, and the [facility] CM 24-hour nurse advice phone number should issues arise prior to next scheduled [place] outreach.  Encouraged patient to contact me directly if needs, questions, issues, or concerns arise prior to next scheduled outreach; patient agreed to do so.  Plan:  Patient will take medications as prescribed and will attend all scheduled provider appointments  Patient will promptly schedule office visit withnewly acquiredPCP  Patient will promptly notify care providers for any new concerns/ issues/ problems that arise  Patient will actively engage with [place] and CSW teams around her stated requests for community resource and medication review assistance  I will place printed educational material in mail to patient around Advanced Directive planning   [place] outreach to continue with scheduled phonenext month   [facility] CM Care Plan Problem One     Most Recent Value  Care Plan Problem One  Need for patient self management of healthcare resources, as evidenced by patient reporting  Role Documenting the Problem One  Care Management [place] for Problem One  Active  [facility] Long Term Goal   Over the next 31 days, patient will become established with new PCP, as evidenced by patient reporting during [place] RN CM outreach  [facility] Long Term Goal Start Date  08/02/18 [Goal re-established today]  Interventions for Problem One Long Term Goal  Discussed and shared with patient previous efforts taken to facilitate PCP scheduling of new patient appointment- provided patient with phone number to her requested new PCP office and encouraged patient to call and schedule this appointment promptly  [facility] CM Short Term Goal #1   Over the next 30 days, patient will discuss community resource needs and engage with [facility] CSW, as evidenced by patient reporting and collaboration with [facility] CSW during [place] RN  CM outreach  [facility] CM Short Term Goal #1 Start Date  08/02/18 [Goal re-established today]  Interventions for Short Term Goal #1  Discussed with patient that [facility] CSW/ BSW have placed calls to patient and been unable to reach her or leave voice message- provided phone numbers for [facility] CSW team and encouraged patient to contact them herself as soon as possible  [facility] CM Short Term Goal #2   Over the next 30 days, patient will discuss her medication needs/ concerns with [facility] Pharmicist, as evidenced by patient reporting and collaboration with [place] team during [place] RN CM outreach  [facility] CM Short Term Goal #2 Start Date  08/02/18 [Goal re-established today]  Interventions for Short Term Goal #2  Discussed with patient that [place] team has made several attempts to contact her and been unable to reach her or to leave voice message- providing phone number for assigned [place] team member and encouarged patient to contact [facility] Pharmacist promptly     Oneta Rack, RN, BSN, [employer] Coordinator [facility] Care Management  [phone]

## 2018-08-02 NOTE — Telephone Encounter (Signed)
Patient advised she had found she can be seen at [facility] to be tested as I had advised earlier an urgent care clinic she will go to [place].

## 2018-08-02 NOTE — Progress Notes (Signed)
   HPI: 68 year old female presenting today for follow up evaluation of bilateral ankle pain and fungal nails of the toes bilaterally. She reports pain in the left Achilles area. Walking increases the pain. She also complains of elongated, thickened toenails of bilateral feet. She is unable to trim her own nails. Patient is here for further evaluation and treatment.   Past Medical History:  Diagnosis Date  . Allergic rhinitis   . Anemia   . Blurred vision   . Depression   . Diverticulosis   . Endometriosis   . Falls   . GERD (gastroesophageal reflux disease)   . Hernia, hiatal   . Hypothyroidism   . IBS (irritable bowel syndrome)   . Malignant neoplasm of skin   . Migraine   . Neuropathy   . PTSD (post-traumatic stress disorder)   . Thyroid disease      Physical Exam: General: The patient is alert and oriented x3 in no acute distress.  Dermatology: Skin is warm, dry and supple bilateral lower extremities. Negative for open lesions or macerations.  Elongated, dystrophic, hyperkeratotic nails noted 1-5 bilateral  Vascular: Palpable pedal pulses bilaterally. No edema or erythema noted. Capillary refill within normal limits.  Neurological: Epicritic and protective threshold grossly intact bilaterally.   Musculoskeletal Exam: Range of motion within normal limits to all pedal and ankle joints bilateral. Muscle strength 5/5 in all groups bilateral.  Significant pain on palpation to the lateral aspect of the bilateral ankle joints  Assessment: 1.  Bilateral ankle synovitis/DJD 2.  Pain due to onychomycosis of toenails   Plan of Care:  1. Patient evaluated.   2. Mechanical debridement of nails 1-5 performed bilateral 3. Recommended good shoe gear.  4. Return to clinic as needed.      Edrick Kins, DPM [facility]  Dr. Edrick Kins, DPM    [address], [place] [address]                Office [phone]   Fax [phone]

## 2018-08-02 NOTE — Patient Outreach (Signed)
[facility]) Care Management  [place]  08/02/2018  Angela [name] 03-12-1950 [id number]   Reason for referral: Medication review, help with obtaining new nebulizer machine  Referral source: [facility] RN Current insurance: [facility]   PMHx includes but not limited to:  Bipolar 1 disorder, PTSD, personality disorder, hx medication non-compliance, thyroid disease, GERD, IBS, neuropathy, migraine, former smoker with chronic bronchitis and mucus hypersecretion, HTN, hx TIA.  Multiple allergies also noted.    Outreach:  Unsuccessful telephone call attempt #2 to patient.   Unable to leave message  Plan:  -I will make another outreach attempt to patient within 3-4 business days.    Ralene Bathe, PharmD, [place] [phone]

## 2018-08-02 NOTE — Patient Outreach (Signed)
[facility]) Care Management  08/02/2018  Angela [name] [birth year] [id number]   CSW made contact with pt and confirmed identity. CSW introduced self and reason for call. Pt voiced a lot of issues around her frustration related to getting help. She is wanting/needing handicap ramp and parking spot, new PCP, etc.  CSW spent a lengthy time talking with pt about these things- she is not comfortable sharing personal info with agency's which may be able to help, stating she does not plan to share with strangers.  CSW assured her that we will attempt to help her as best we can- "don't tell me to call [place], I need you to....Marland Kitchen"  and yet we need her to be willing to take some steps also.  CSW reminded her that her [facility] provider has resources and customer service support.  Pt acknowledges that she has been going to [facility] where she sees "Dr Andree Elk". She indicates she has a "care support worker" who she has seen 3 times and does not feel her RX are working effectively. CSW encouraged pt to speak with her Provider about this and to consider adjustments/changes.    Pt is also linked with Amber Chrismon, BSW, who is assisting.  CSW will discuss this case with [facility] team to better collaborate and assist pt.  Eduard Clos, MSW, [place] Work[place]  [place] [phone]

## 2018-08-02 NOTE — Telephone Encounter (Signed)
Patient cannot be tested  for COVID 19  this afternoon,  Evening or weekend unless she goes to urgent care or ER

## 2018-08-02 NOTE — Telephone Encounter (Signed)
Patient has SOB, cough,  congestion, patient has had chills afebrile, patient says she has headaches , and body aches.advised patient she should be evaluated at ED or UC she refused , stating please send this to MD she has an appointment with to establish care advised I would patient wants covid testing stating she is already taking doxy and azithromax, patient say UC or ER will not test.

## 2018-08-05 ENCOUNTER — Ambulatory Visit: Payer: Self-pay

## 2018-08-05 NOTE — Telephone Encounter (Signed)
[facility] said patient still wanted to move appointment up due to still having symptoms , patient was transferred to me , patient stated she was calling to say she feels better and to apologize for attitude on 08/02/18 advised patient no need to apologize, ask if she was still having symptoms she stated no she was trying to move appointment forward advised only available new patient appointment was date and time patient has scheduled she stated she would keep appointment , ask if patient went to UC she stated no that she just had some gatorade and that seem to help her symptoms.

## 2018-08-06 ENCOUNTER — Other Ambulatory Visit: Payer: Self-pay

## 2018-08-06 ENCOUNTER — Ambulatory Visit: Payer: Self-pay | Admitting: *Deleted

## 2018-08-06 ENCOUNTER — Ambulatory Visit: Payer: Self-pay

## 2018-08-06 DIAGNOSIS — M25511 Pain in right shoulder: Secondary | ICD-10-CM | POA: Diagnosis not present

## 2018-08-06 DIAGNOSIS — M4712 Other spondylosis with myelopathy, cervical region: Secondary | ICD-10-CM | POA: Diagnosis not present

## 2018-08-06 DIAGNOSIS — S92132A Displaced fracture of posterior process of left talus, initial encounter for closed fracture: Secondary | ICD-10-CM | POA: Diagnosis not present

## 2018-08-06 DIAGNOSIS — Z87891 Personal history of nicotine dependence: Secondary | ICD-10-CM | POA: Diagnosis not present

## 2018-08-06 DIAGNOSIS — M7918 Myalgia, other site: Secondary | ICD-10-CM | POA: Diagnosis not present

## 2018-08-06 DIAGNOSIS — I700 Atherosclerosis of aorta: Secondary | ICD-10-CM | POA: Diagnosis not present

## 2018-08-06 NOTE — Patient Outreach (Signed)
[place] [facility]) Care Management  08/06/2018  Rose-Marie Hickling 31-Jul-1950 [id number]   BSW received return call from patient.  She reports that assessment for personal care services was completed yesterday and she was approved for services.  Patient did not receive Home Care Directory that she requested be faxed last week, however, she was provided with options of providers by [facility].  As requested, BSW also faxed list of counselors in the [facility] area but patient did not receive this.  She requested that [place] Directory and list of counselors from [employer] be mailed to [address]  [place] [address]. BSW will mail resources today.  Will follow up within two weeks to ensure receipt and to get update on status of pcs.  Ronn Melena, BSW Social Worker [phone]

## 2018-08-06 NOTE — Patient Outreach (Signed)
[place] [facility]) Care Management  08/06/2018  Reagan Behlke [birth year] [id number]   Attempted to outreach patient today regarding status of assessment for personal care services that was scheduled to occur yesterday.  Patient did not answer.  Unable to leave voicemail message because mailbox remains full.  Will attempt to reach again within four business days.  Ronn Melena, BSW Social Worker [phone]

## 2018-08-07 ENCOUNTER — Other Ambulatory Visit: Payer: Self-pay | Admitting: Pharmacist

## 2018-08-07 ENCOUNTER — Encounter: Payer: Self-pay | Admitting: Internal Medicine

## 2018-08-07 ENCOUNTER — Other Ambulatory Visit: Payer: Self-pay

## 2018-08-07 ENCOUNTER — Telehealth: Payer: Self-pay | Admitting: Cardiovascular Disease

## 2018-08-07 ENCOUNTER — Ambulatory Visit (INDEPENDENT_AMBULATORY_CARE_PROVIDER_SITE_OTHER): Payer: Medicare Other | Admitting: Internal Medicine

## 2018-08-07 DIAGNOSIS — D509 Iron deficiency anemia, unspecified: Secondary | ICD-10-CM | POA: Diagnosis not present

## 2018-08-07 DIAGNOSIS — M545 Low back pain, unspecified: Secondary | ICD-10-CM

## 2018-08-07 DIAGNOSIS — E612 Magnesium deficiency: Secondary | ICD-10-CM

## 2018-08-07 DIAGNOSIS — E441 Mild protein-calorie malnutrition: Secondary | ICD-10-CM

## 2018-08-07 DIAGNOSIS — F609 Personality disorder, unspecified: Secondary | ICD-10-CM

## 2018-08-07 DIAGNOSIS — E559 Vitamin D deficiency, unspecified: Secondary | ICD-10-CM

## 2018-08-07 DIAGNOSIS — G8929 Other chronic pain: Secondary | ICD-10-CM

## 2018-08-07 DIAGNOSIS — R05 Cough: Secondary | ICD-10-CM

## 2018-08-07 DIAGNOSIS — R058 Other specified cough: Secondary | ICD-10-CM

## 2018-08-07 DIAGNOSIS — E039 Hypothyroidism, unspecified: Secondary | ICD-10-CM | POA: Diagnosis not present

## 2018-08-07 DIAGNOSIS — N3946 Mixed incontinence: Secondary | ICD-10-CM

## 2018-08-07 NOTE — Patient Outreach (Signed)
[facility]) Care Management  [place]  08/07/2018  Lailana Shira 01-28-1950 [id number]   Reason for referral: Medication review, help with obtaining new nebulizer machine  Referral source: [facility] RN Current insurance: [facility]   PMHx includes but not limited PY:[id number] 1 disorder, PTSD, personality disorder, hx medication non-compliance, thyroid disease, GERD, IBS, neuropathy, migraine, former smoker with chronic bronchitis and mucus hypersecretion, HTN, hx TIA. Multiple allergies also noted.   Outreach:  Unsuccessful telephone call attempt #3 to patient.   Unable to leave message  Plan:  -I will follow-up on 10th business day from opening case.  If no response from patient at this time, I will close [place] case.   Ralene Bathe, PharmD, [place] [phone]

## 2018-08-07 NOTE — Telephone Encounter (Signed)
Patient calling  Patient states she had spoke to nurse earlier about some medicare orders online States that her provider has approved so now she will need assistance on how to do, where to send etc Please call to discuss

## 2018-08-07 NOTE — Progress Notes (Signed)
Virtual Visit via Telephone  Note  This visit type was conducted due to national recommendations for restrictions regarding the COVID-19 pandemic (e.g. social distancing).  This format is felt to be most appropriate for this patient at this time.  All issues noted in this document were discussed and addressed.  No physical exam was performed (except for noted visual exam findings with Video Visits).   I connected with@ on 08/07/18 at 10:30 AM EDT by a video enabled telemedicine application or telephone and verified that I am speaking with the correct person using two identifiers. Location patient: home Location provider: work or home office Persons participating in the virtual visit: patient, provider  I discussed the limitations, risks, security and privacy concerns of performing an evaluation and management service by telephone and the availability of in person appointments. I also discussed with the patient that there may be a patient responsible charge related to this service. The patient expressed understanding and agreed to proceed.  Reason for visit: establish care  HPI:  68 yr old female with complicated medical history presents to establish care .    Recovering from closed displaced left talus and right Achilles strain /righ trochanteric bursitis   Referred to GI by GYN for abdominal bloating and hernia.  Has cancelled app t per notes due to "a lot going on" RECENT MEDICAL HISTORY:  Treated in ED for atypical chest pain and  Left sided headache June 25 . Ruled out for PE DEXA:  June 2020: osteopenia of femur and spine  3d mammogram: normal EGD March 2017:  6 cm HH,  Tortuous esophagus,  Multiple gastric polyps,  Wide pylorus History of TIA IN 2017.  ECHO 2017: normal EF,  Extrinsic compression of RA by HH   ROS: See pertinent positives and negatives per HPI.  Past Medical History:  Diagnosis Date  . Allergic rhinitis   . Anemia   . Blurred vision   . Depression   .  Diverticulosis   . Endometriosis   . Falls   . GERD (gastroesophageal reflux disease)   . Hernia, hiatal   . Hypothyroidism   . IBS (irritable bowel syndrome)   . Malignant neoplasm of skin   . Migraine   . Neuropathy   . PTSD (post-traumatic stress disorder)   . Thyroid disease     Past Surgical History:  Procedure Laterality Date  . APPENDECTOMY    . CHOLECYSTECTOMY    . CHOLECYSTECTOMY, LAPAROSCOPIC    . ESOPHAGOGASTRODUODENOSCOPY (EGD) WITH PROPOFOL N/A 03/29/2015   Procedure: ESOPHAGOGASTRODUODENOSCOPY (EGD) WITH PROPOFOL;  Surgeon: Josefine Class, MD;  Location[facility] ENDOSCOPY;  Service: Endoscopy;  Laterality: N/A;  . HERNIA REPAIR    . laproscopy    . TONSILLECTOMY    . uteral suspension      Family History  Problem Relation Age of Onset  . Heart disease Father   . Hearing loss Father   . COPD Father   . Depression Father   . Stroke Father   . Vision loss Father   . Varicose Veins Father   . Cancer Mother        lung  . Depression Sister   . Arthritis Sister   . Arthritis Brother   . Hernia Brother   . Anxiety disorder Brother   . Arthritis Brother   . Hernia Brother   . Anxiety disorder Brother   . Urolithiasis Neg Hx   . Kidney disease Neg Hx   . Kidney cancer Neg Hx   .  Prostate cancer Neg Hx     SOCIAL HX: living independently in an apartment .  reports that she quit smoking about 41 years ago. She has never used smokeless tobacco. She reports current alcohol use. She reports that she does not use drugs.   Current Outpatient Medications:  .  albuterol (VENTOLIN HFA) 108 (90 Base) MCG/ACT inhaler, Inhale 2 puffs into the lungs every 4 (four) hours as needed for wheezing or shortness of breath., Disp: 1 Inhaler, Rfl: 0 .  Amino Acids-Protein Hydrolys (FEEDING SUPPLEMENT, PRO-STAT 64,) LIQD, Take 30 mLs by mouth daily. (Patient not taking: Reported on 08/08/2018), Disp: 887 mL, Rfl: 0 .  atorvastatin (LIPITOR) 20 MG tablet, Take 1 tablet (20 mg  total) by mouth at bedtime., Disp: 90 tablet, Rfl: 0 .  azithromycin (ZITHROMAX) 250 MG tablet, Take 1 tablet (250 mg total) by mouth every other day. Take every Mon, Wed, Fri, Disp: 30 each, Rfl: 5 .  Biotin 10000 MCG TABS, Take 10,000 mcg by mouth daily. , Disp: , Rfl:  .  Cholecalciferol 25 MCG (1000 UT) tablet, Take 1 tablet (1,000 Units total) by mouth daily., Disp: 30 tablet, Rfl: 1 .  Cyanocobalamin (VITAMIN B-12) 1000 MCG SUBL, Place 1,000 mcg under the tongue. , Disp: , Rfl:  .  diclofenac sodium (VOLTAREN) 1 % GEL, Apply small amount (2 grams) topically over knees or hands up to four times a day if needed for arthritis, Disp: 100 g, Rfl: 0 .  doxycycline (VIBRA-TABS) 100 MG tablet, Take 100 mg by mouth 2 (two) times daily. Taking on days Sunday, Tuesday, Thursday, Saturday, Disp: , Rfl:  .  estradiol (ESTRING) 2 MG vaginal ring, Place 2 mg vaginally every 3 (three) months. follow package directions, Disp: 1 each, Rfl: 4 .  famotidine (PEPCID) 20 MG tablet, Take 1 tablet (20 mg total) by mouth 2 (two) times daily as needed for heartburn or indigestion., Disp: 180 tablet, Rfl: 0 .  ferrous sulfate 325 (65 FE) MG tablet, Take 325 mg by mouth daily with breakfast., Disp: , Rfl:  .  fluconazole (DIFLUCAN) 150 MG tablet, TAKE 1 TABLET EACH WEEK WHILE TAKING DOXYCYCLINE, Disp: , Rfl:  .  gabapentin (NEURONTIN) 100 MG capsule, Take 100 mg by mouth 2 (two) times daily., Disp: , Rfl:  .  hydrOXYzine (ATARAX/VISTARIL) 10 MG tablet, TAKE 1 TO 2 TABLETS BY MOUTH ONCE DAILY AS NEEDED, Disp: , Rfl:  .  Icosapent Ethyl (VASCEPA) 1 g CAPS, TAKE 2 CAPSULES BY MOUTH TWICE DAILY (TO HELP WITH TRIGLYCERIDES), Disp: 360 capsule, Rfl: 0 .  levothyroxine (SYNTHROID) 100 MCG tablet, Take 1 tablet (100 mcg total) by mouth daily at 6 (six) AM., Disp: 90 tablet, Rfl: 0 .  linaclotide (LINZESS) 290 MCG CAPS capsule, Take 1 capsule (290 mcg total) by mouth daily before breakfast., Disp: 30 capsule, Rfl: 0 .  LORazepam  (ATIVAN) 0.5 MG tablet, TAKE 1 TABLET BY MOUTH AS NEEDED ONCE DAILY, Disp: , Rfl:  .  losartan (COZAAR) 50 MG tablet, Take 1 tablet (50 mg total) by mouth daily., Disp: 90 tablet, Rfl: 3 .  Multiple Vitamin (MULTIVITAMIN WITH MINERALS) TABS tablet, Take 1 tablet by mouth daily. (Patient not taking: Reported on 08/08/2018), Disp: 30 tablet, Rfl: 1 .  PARoxetine (PAXIL) 20 MG tablet, Take 20 mg by mouth daily., Disp: , Rfl:  .  prednisoLONE acetate (PRED FORTE) 1 % ophthalmic suspension, INSTILL 1 DROP INTO EACH EYE TWICE DAILY, Disp: , Rfl:  .  PREMARIN vaginal  cream, Place 1 Applicatorful vaginally as needed. , Disp: , Rfl:  .  pyridoxine (B-6) 100 MG tablet, Take 1 tablet (100 mg total) by mouth 2 (two) times daily., Disp: 60 tablet, Rfl: 0 .  umeclidinium bromide (INCRUSE ELLIPTA) 62.5 MCG/INH AEPB, Inhale 1 puff into the lungs daily., Disp: 60 each, Rfl: 5 .  XIIDRA 5 % SOLN, INSTILL 1 DROP INTO EACH EYE TWICE DAILY, Disp: , Rfl:  .  acetaminophen (TYLENOL) 650 MG CR tablet, Take 650 mg by mouth every 8 (eight) hours as needed for pain., Disp: , Rfl:  .  Acetylcysteine (NAC) 500 MG CAPS, Take 500 mg by mouth 2 (two) times a day., Disp: , Rfl:  .  Calcium Citrate (CITRACAL PO), Take 800 mg by mouth. Takes 2 (400mg ) tablets BID, Disp: , Rfl:  .  fluticasone (FLONASE) 50 MCG/ACT nasal spray, Place 2 sprays into both nostrils daily. , Disp: , Rfl:  .  Magnesium 500 MG TABS, Take 500 mg by mouth every other day., Disp: , Rfl:  .  pregabalin (LYRICA) 75 MG capsule, Take 75 mg by mouth 2 (two) times daily. , Disp: , Rfl:  .  tiZANidine (ZANAFLEX) 2 MG tablet, Take by mouth every 6 (six) hours as needed for muscle spasms., Disp: , Rfl:  .  traZODone (DESYREL) 50 MG tablet, 50 mg at bedtime as needed. Pt takes 1/2 tablet qhs, Disp: , Rfl:   EXAM:   General impression: alert, cooperative and articulate.  No signs of being in distress  Lungs: speech is fluent sentence length suggests that patient is not  short of breath and not punctuated by cough, sneezing or sniffing. Marland Kitchen   Psych: affect normal.  speech is articulate and non pressured .  Denies suicidal thoughts    ASSESSMENT AND PLAN:   Personality disorder [facility]) She provides a medical history summary that includes  allegations of medical negligence and institutional exploitation but is very appreciative of my accepting her as a new patient .   Mixed stress and urge urinary incontinence DME requested for adult pullups ; rx written   Magnesium deficiency Noted in the past.  Reordered due to ongoing malnutrition issues   Malnutrition of mild degree ([place]) DME for protein supplements   Cough productive of yellow sputum Sending for COVID 19 testing   Chronic bilateral low back pain without sciatica She has been referred to the Pain Clinic by her neurologist     I discussed the assessment and treatment plan with the patient. The patient was provided an opportunity to ask questions and all were answered. The patient agreed with the plan and demonstrated an understanding of the instructions.   The patient was advised to call back or seek an in-person evaluation if the symptoms worsen or if the condition fails to improve as anticipated.  I provided 40 minutes of non-face-to-face time during this encounter.   Crecencio Mc, MD

## 2018-08-08 ENCOUNTER — Other Ambulatory Visit: Payer: Self-pay | Admitting: Pharmacist

## 2018-08-08 ENCOUNTER — Other Ambulatory Visit: Payer: Self-pay | Admitting: *Deleted

## 2018-08-08 ENCOUNTER — Ambulatory Visit: Payer: Self-pay | Admitting: Pharmacist

## 2018-08-08 ENCOUNTER — Telehealth: Payer: Self-pay | Admitting: Family Medicine

## 2018-08-08 DIAGNOSIS — E441 Mild protein-calorie malnutrition: Secondary | ICD-10-CM | POA: Insufficient documentation

## 2018-08-08 DIAGNOSIS — E612 Magnesium deficiency: Secondary | ICD-10-CM | POA: Insufficient documentation

## 2018-08-08 DIAGNOSIS — N3946 Mixed incontinence: Secondary | ICD-10-CM | POA: Insufficient documentation

## 2018-08-08 DIAGNOSIS — R05 Cough: Secondary | ICD-10-CM | POA: Insufficient documentation

## 2018-08-08 HISTORY — DX: Mild protein-calorie malnutrition: E44.1

## 2018-08-08 NOTE — Telephone Encounter (Signed)
Pt called to check status of her refill for ferrous sulfate 325 (65 FE) MG tablet Please advise  Pt also called about the RX for a protein supplement and pull ups/depends underwear. Pt stated that she needs 2 different letters for each item to give to medicare so they will pay for them or provide them/ please advise

## 2018-08-08 NOTE — Addendum Note (Signed)
Addended by: Rudean Haskell on: 08/08/2018 03:34 PM   Modules accepted: Orders
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
## 2018-08-08 NOTE — Assessment & Plan Note (Signed)
Noted in the past.  Reordered due to ongoing malnutrition issues

## 2018-08-08 NOTE — Patient Outreach (Signed)
[place] [facility]) Care Management  08/06/2018 Late Entry  Angela [name] 07-06-1950 [id number]   CSW was unable to reach pt by phone and will try again in the next 3-4 business days per policy.   Eduard Clos, MSW, [place] Worker  [place] [phone]

## 2018-08-08 NOTE — Assessment & Plan Note (Signed)
DME for protein supplements

## 2018-08-08 NOTE — Assessment & Plan Note (Signed)
DME requested for adult pullups ; rx written

## 2018-08-08 NOTE — Patient Outreach (Addendum)
[place] [facility]) Care Management [place]  08/08/2018  Angela [name] [birth year] [id number]  Reason for referral:Medication review, help with obtaining new nebulizer machine  Referral source:[facility] RN Current insurance:[employer] [place]  PMHx includes but not limited GU:[id number] 1 disorder, PTSD, personality disorder, hx medication non-compliance, thyroid disease, GERD, IBS, neuropathy, migraine, former smoker with chronic bronchitis and mucus hypersecretion, HTN, hx TIA. Multiple allergies also noted.  -Communication from [facility] that patient called to see about "assistance."  Note routed to me.    4th phone call attempt to patient this morning.  Unfortunately I am still not able to contact patient.  Voicemail full so unable to leave message.    [facility] pharmacy case is being closed due to the following reasons:  -Unable to engage or maintain contact with patient -I am happy to assist in the future if patient able to engage with [place], PharmD, [place] [phone]   Incoming call from patient.  She is agreeable to review medications.   Medications Reviewed Today    Reviewed by Adair Laundry, [place] (Certified Medical Assistant) on 08/07/18 at 73  Med List Status: <None>  Medication Order Taking? Sig Documenting Provider Last Dose Status Informant  acetaminophen (TYLENOL) 650 MG CR tablet [id number] Yes Take 1 tablet (650 mg total) by mouth every 8 (eight) hours as needed for pain. Fredderick Severance, NP Taking Active   albuterol (VENTOLIN HFA) 108 (90 Base) MCG/ACT inhaler [id number] Yes Inhale 2 puffs into the lungs every 4 (four) hours as needed for wheezing or shortness of breath. Hubbard Hartshorn, FNP Taking Active   Amino Acids-Protein Hydrolys (FEEDING SUPPLEMENT, PRO-STAT 64,) LIQD [id number] Yes Take 30 mLs by mouth daily. Fredderick Severance, NP Taking Active    atorvastatin (LIPITOR) 20 MG tablet [id number] Yes Take 1 tablet (20 mg total) by mouth at bedtime. Hubbard Hartshorn, FNP Taking Active   azithromycin (ZITHROMAX) 250 MG tablet [id number] Yes Take 1 tablet (250 mg total) by mouth every other day. Take every Mon, Wed, Fri Wilhelmina Mcardle, MD Taking Active   Biotin 10000 MCG TABS [id number] Yes Take by mouth. [provider] Taking Active   Calcium Citrate-Vitamin D (CALCIUM CITRATE + D) 315-250 MG-UNIT TABS [id number] Yes Take 1 tablet by mouth daily. Rubie Maid, MD Taking Active   Cholecalciferol 25 MCG (1000 UT) tablet [id number] Yes Take 1 tablet (1,000 Units total) by mouth daily. Clapacs, Madie Reno, MD Taking Active   Cyanocobalamin (VITAMIN B-12) 1000 MCG SUBL [id number] Yes Place under the tongue. [provider] Taking Active   diclofenac sodium (VOLTAREN) 1 % GEL [id number] Yes Apply small amount (2 grams) topically over knees or hands up to four times a day if needed for arthritis Hubbard Hartshorn, FNP Taking Active   doxycycline (VIBRA-TABS) 100 MG tablet [id number] Yes TAKE 1 TABLET BY MOUTH TWICE DAILY FOR 14 DAYS ON DAYS WHEN NOT TAKING AZITHROMYCIN [provider] Taking Active   estradiol (ESTRING) 2 MG vaginal ring [id number] Yes Place 2 mg vaginally every 3 (three) months. follow package directions Rubie Maid, MD Taking Active   famotidine (PEPCID) 20 MG tablet [id number] Yes Take 1 tablet (20 mg total) by mouth 2 (two) times daily as needed for heartburn or indigestion. Hubbard Hartshorn, FNP Taking Active   ferrous sulfate 325 (65 FE) MG tablet [id number] Yes Take 325 mg by mouth daily with breakfast. [provider] Taking Active  fluconazole (DIFLUCAN) 150 MG tablet [id number] Yes TAKE 1 TABLET EACH WEEK WHILE TAKING DOXYCYCLINE [provider] Taking Active   fluticasone (FLONASE) 50 MCG/ACT nasal spray [id number] No Place into both nostrils daily. [provider] Not Taking Active    gabapentin (NEURONTIN) 100 MG capsule [id number] Yes Take 100 mg by mouth 2 (two) times daily. [provider] Taking Active   Glucosamine-Chondroitin (GLUCOSAMINE CHONDR COMPLEX PO) [id number] No Take by mouth. [provider] Not Taking Active   hydrOXYzine (ATARAX/VISTARIL) 10 MG tablet [id number] Yes TAKE 1 TO 2 TABLETS BY MOUTH ONCE DAILY AS NEEDED [provider] Taking Active   Icosapent Ethyl (VASCEPA) 1 g CAPS [id number] Yes TAKE 2 CAPSULES BY MOUTH TWICE DAILY (TO HELP WITH TRIGLYCERIDES) Hubbard Hartshorn, FNP Taking Active   levothyroxine (SYNTHROID) 100 MCG tablet [id number] Yes Take 1 tablet (100 mcg total) by mouth daily at 6 (six) AM. Hubbard Hartshorn, FNP Taking Active   linaclotide Rolan Lipa) 290 MCG CAPS capsule [id number] Yes Take 1 capsule (290 mcg total) by mouth daily before breakfast. Clapacs, Madie Reno, MD Taking Active   LORazepam (ATIVAN) 0.5 MG tablet [id number] Yes TAKE 1 TABLET BY MOUTH AS NEEDED ONCE DAILY [provider] Taking Active   losartan (COZAAR) 50 MG tablet [id number] Yes Take 1 tablet (50 mg total) by mouth daily. Minna Merritts, MD Taking Active   magnesium oxide (MAG-OX) 400 (241.3 Mg) MG tablet [id number] Yes Take 0.5 tablets (200 mg total) by mouth 2 (two) times daily.  Patient taking differently: Take 500 mg by mouth daily.    Clapacs, Madie Reno, MD Taking Active   Multiple Vitamin (MULTIVITAMIN WITH MINERALS) TABS tablet [id number] Yes Take 1 tablet by mouth daily. Clapacs, Madie Reno, MD Taking Active   PARoxetine (PAXIL) 20 MG tablet [id number] Yes Take 20 mg by mouth daily. [provider] Taking Active   prednisoLONE acetate (PRED FORTE) 1 % ophthalmic suspension [id number] Yes INSTILL 1 DROP INTO [facility] EYE TWICE DAILY [provider] Taking Active   PREMARIN vaginal cream [id number] Yes  [provider] Taking Active   pyridoxine (B-6) 100 MG tablet [id number] Yes Take 1 tablet (100 mg total) by mouth 2 (two)  times daily. Clapacs, Madie Reno, MD Taking Active   Respiratory Therapy Supplies (FLUTTER) DEVI [id number] Yes 1 each by Does not apply route daily. Wilhelmina Mcardle, MD Taking Active   simethicone [facility]) 80 MG chewable tablet [id number] Yes Chew by mouth. [provider] Taking Active   tiZANidine (ZANAFLEX) 2 MG tablet [id number] No Take by mouth every 6 (six) hours as needed for muscle spasms. [provider] Not Taking Active   traZODone (DESYREL) 50 MG tablet [id number] No 50 mg as needed. Pt takes 1/2 tablet qhs [provider] Not Taking Active Self  umeclidinium bromide (INCRUSE ELLIPTA) 62.5 MCG/INH AEPB [id number] Yes Inhale 1 puff into the lungs daily. Wilhelmina Mcardle, MD Taking Active   UNABLE TO FIND [id number] Yes Recliner with elevated leg rest, automatic and raises off of the floor to assist with standing Lada, Satira Anis, MD Taking Active   XIIDRA 5 % SOLN [id number] Yes INSTILL 1 DROP INTO [facility] EYE TWICE DAILY [provider] Taking Active          Reviewed medications with patient in detail.  Patient had several questions regarding indications, side effects, and dosing.  She requests that I mail her a copy of her medication list once she can finalize dose of  Lyrica.  We also discussed mail order and I recommended she either contact [place] or her provider to send in new RXs.  Patient voiced understanding.   Plan: Will mail patient print out of medication list once she clarifies Lyrica.  Will keep [facility] pharmacy case closed  Ralene Bathe, PharmD, [place] [phone]

## 2018-08-08 NOTE — Addendum Note (Signed)
Addended by: Rudean Haskell on: 08/08/2018 04:21 PM   Modules accepted: Orders

## 2018-08-08 NOTE — Assessment & Plan Note (Signed)
Sending for COVID 19 testing

## 2018-08-08 NOTE — Assessment & Plan Note (Signed)
She has been referred to the Pain Clinic by her neurologist

## 2018-08-09 ENCOUNTER — Ambulatory Visit: Payer: Self-pay | Admitting: Pharmacist

## 2018-08-09 ENCOUNTER — Other Ambulatory Visit: Payer: Self-pay | Admitting: *Deleted

## 2018-08-09 ENCOUNTER — Ambulatory Visit: Payer: Self-pay

## 2018-08-09 ENCOUNTER — Ambulatory Visit: Payer: Self-pay | Admitting: *Deleted

## 2018-08-09 NOTE — Patient Outreach (Signed)
[place] [facility]) Care Management  08/09/2018  Angela [name] 14-Jan-1951 [id number]   CSW was unable to reach pt by phone and will outreach again in 3-4 business days if no return call is received.    Eduard Clos, MSW, [place] Worker  [place] [phone]

## 2018-08-12 ENCOUNTER — Ambulatory Visit: Payer: Medicare Other | Admitting: Pulmonary Disease

## 2018-08-12 NOTE — Progress Notes (Signed)
Patient has been scheduled

## 2018-08-12 NOTE — Telephone Encounter (Signed)
Pt is no longer a patient at [facility].   Established care with [facility], Dr. Derrel Nip on 08/02/18.

## 2018-08-14 ENCOUNTER — Other Ambulatory Visit: Payer: Self-pay | Admitting: *Deleted

## 2018-08-14 ENCOUNTER — Ambulatory Visit: Payer: Self-pay | Admitting: *Deleted

## 2018-08-14 NOTE — Patient Outreach (Signed)
[place] [facility]) Care Management  08/14/2018  Angela [name] 04-05-1950 [id number]   CSW attempted to reach pt and was unable to make contact. CSW left a HIPPA compliant voice message and will try again within the 3 calls in 10 business days policy.  Eduard Clos, MSW, [place] Worker  [place] [phone]

## 2018-08-16 DIAGNOSIS — M7062 Trochanteric bursitis, left hip: Secondary | ICD-10-CM | POA: Diagnosis not present

## 2018-08-16 DIAGNOSIS — M25551 Pain in right hip: Secondary | ICD-10-CM | POA: Diagnosis not present

## 2018-08-19 ENCOUNTER — Ambulatory Visit: Payer: Self-pay | Admitting: *Deleted

## 2018-08-20 ENCOUNTER — Other Ambulatory Visit: Payer: Self-pay | Admitting: *Deleted

## 2018-08-20 ENCOUNTER — Ambulatory Visit: Payer: Self-pay

## 2018-08-20 ENCOUNTER — Encounter: Payer: Self-pay | Admitting: *Deleted

## 2018-08-20 ENCOUNTER — Other Ambulatory Visit: Payer: Self-pay

## 2018-08-20 NOTE — Patient Outreach (Signed)
[place] [facility]) Care Management [place] Telephone Outreach Unsuccessful consecutive outreach attempt # 1- previously engaged patient  08/20/2018  Angela [name] 1950/10/26   [id number]  11:15 am:  Unsuccessful telephone outreach to Angela [name], 68 y/o femalepatient referred to [place] by inpatient hospital CSW for community resource needs after patient had ED visit for atypical chest pain. Patient was discharged home with home health services through [facility] post- ED visit, which patient later refused, according to home health agency. Patient has history including, but not limited to: anxiety/ depression/ bipolar; chronic bronchitis; HTN; GERD.   Was again unable to leave patient HIPAA compliant voice message, as automated outgoing voice mail box message continues to report that her voice mail box remains full and cannot accept any messages  Plan:  Will re-attempt [place] telephone outreach within 4 business days if I do not hear back from patient first.  Oneta Rack, RN, BSN, [place] Coordinator [facility] Care Management  [phone]

## 2018-08-20 NOTE — Patient Outreach (Signed)
[place] [facility]) Care Management  08/20/2018  Alonda Weaber 10/21/1950 [id number]   Attempted to contact patient today to ensure receipt of Home Care Directory and list of counselors from [employer] that were mailed on 08/06/18.  No answer and voicemail box was full.  Will attempt to reach again within four business days.  Ronn Melena, BSW Social Worker [phone]

## 2018-08-20 NOTE — Patient Outreach (Signed)
[place] [facility]) Care Management  08/20/2018  Pam Vanalstine 01/09/1951 [id number]   [place] attempted a 3rd and final outreach to reach pt and was unsuccessful.  CSW will plan case closure per policy and advise [facility] team and PCP.   Eduard Clos, MSW, [place] Worker  [place] [phone]

## 2018-08-22 ENCOUNTER — Encounter: Payer: Self-pay | Admitting: Pulmonary Disease

## 2018-08-22 ENCOUNTER — Ambulatory Visit (INDEPENDENT_AMBULATORY_CARE_PROVIDER_SITE_OTHER): Payer: Medicare Other | Admitting: Pulmonary Disease

## 2018-08-22 DIAGNOSIS — J069 Acute upper respiratory infection, unspecified: Secondary | ICD-10-CM

## 2018-08-22 DIAGNOSIS — J410 Simple chronic bronchitis: Secondary | ICD-10-CM

## 2018-08-22 DIAGNOSIS — R053 Chronic cough: Secondary | ICD-10-CM

## 2018-08-22 DIAGNOSIS — R05 Cough: Secondary | ICD-10-CM

## 2018-08-22 NOTE — Progress Notes (Signed)
PULMONARY OFFICE FOLLOW-UP NOTE  Requesting MD/Service: Lada Date of initial consultation: 07/01/18 Reason for consultation: Former smoker, chronic bronchitis  PT PROFILE: 68 y.o. female former smoker (2 PPD x11 years, quit age 50) previously followed by Dr. Raul Del with persistent symptoms of chronic bronchitis.  DATA: 11/25/16 CTA chest: No evidence of pulmonary embolism.  Bibasilar atelectasis noted.  Otherwise lungs are clear.  Large hiatal hernia noted. 05/14/17 PFTs: FVC: 3.14 L (113 %pred), FEV1: 2.49 L (117 %pred), FEV1/FVC: 79.5%, TLC: 4.43 L (90.4 %pred), DLCO 87.9 %pred  Virtual Visit via Telephone Note I connected with [name] on 08/22/18 at  1:30 PM EDT by telephone and verified that I am speaking with the correct person using two identifiers. I discussed the limitations, risks, security and privacy concerns of performing an evaluation and management service by telephone and the availability of in person appointments. I also discussed with the patient that there may be a patient responsible charge related to this service. The patient expressed understanding and agreed to proceed.  INTERVAL: Initial consultation 07/01/2018.  At that time, I clarified her diagnosis as simple chronic bronchitis.  She has multiple medical intolerances including difficulty tolerating beta agonists and intolerance to inhaled and systemic corticosteroids.  Therefore, I prescribed Incruse inhaler, every other day azithromycin, daily cetirizine.  SUBJ:  This is a scheduled follow-up which was changed to remote virtual visit (via telephone) because she has developed a "bad cold" over the past 2-3 days with increased nasal congestion and generalized fatigue.  She denies fever, increased shortness of breath, hemoptysis, chest pain.  She does have chronic cough productive of white phlegm which might be slightly increased in volume over the past 2 to 3 days.  Per my instructions last encounter, she  has started on azithromycin every other day and cetirizine daily.  However, she has not started the Incruse inhaler stating that she was "intimidated" by it. Prior to this recent "cold", her symptoms of chronic cough and sputum production were somewhat improved.   There were no vitals filed for this visit.   EXAM:  Due to the remote nature of this encounter, no physical exam could be performed  DATA:   BMP Latest Ref Rng & Units 07/11/2018 06/08/2018 04/19/2018  Glucose 70 - 99 mg/dL 103(H) 77 95  BUN 8 - 23 mg/dL 14 12 15   Creatinine 0.44 - 1.00 mg/dL 0.53 0.66 0.60  BUN/Creat Ratio 6 - 22 (calc) - - -  Sodium 135 - 145 mmol/L 135 134(L) 138  Potassium 3.5 - 5.1 mmol/L 3.6 3.6 3.9  Chloride 98 - 111 mmol/L 102 98 102  CO2 22 - 32 mmol/L 26 27 26   Calcium 8.9 - 10.3 mg/dL 8.9 9.1 9.2    CBC Latest Ref Rng & Units 07/11/2018 06/08/2018 04/19/2018  WBC 4.0 - 10.5 K/uL 4.9 5.4 4.6  Hemoglobin 12.0 - 15.0 g/dL 12.0 12.4 12.1  Hematocrit 36.0 - 46.0 % 35.5(L) 36.4 36.7  Platelets 150 - 400 K/uL 255 272 277    CXR: No new film  I have personally reviewed all chest radiographs reported above including CXRs and CT chest unless otherwise indicated  IMPRESSION:     ICD-10-CM   1. Simple chronic bronchitis (HCC)  J41.0   2. Chronic cough  R05   3. Acute URI - doubt COVID-19 (no contacts, no fever, no dyspnea). All of her symptoms of upper respiratory  J06.9    Multiple medical intolerances creating difficulty establishing a medical regimen. She  is intolerant to both systemic and inhaled steroids. She has difficulty tolerating beta agonists though she does use nebulized levalbuterol daily  PLAN:  1) Begin Incruse inhaler (LAMA) as previously prescribed, 1 inhalation daily 2) Cont azithromycin 250 mg to be taken every M, W, F 3) continue levalbuterol nebulizer or pro-air inhaler as needed for increased shortness of breath, wheezing, chest tightness, cough  4) Follow up in 3 months with any  provider   Merton Border, MD PCCM service Mobile [phone] Pager [phone] 08/22/2018 1:20 PM

## 2018-08-22 NOTE — Patient Instructions (Signed)
1) Begin Incruse inhaler (LAMA) as previously prescribed, 1 inhalation daily 2) Cont azithromycin 250 mg to be taken every M, W, F 3) continue levalbuterol nebulizer or pro-air inhaler as needed for increased shortness of breath, wheezing, chest tightness, cough  4) Follow up in 3 months with any provider

## 2018-08-23 ENCOUNTER — Encounter: Payer: Self-pay | Admitting: *Deleted

## 2018-08-23 ENCOUNTER — Other Ambulatory Visit: Payer: Self-pay | Admitting: *Deleted
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
## 2018-08-23 NOTE — Patient Outreach (Signed)
[facility]) Care Management [place] Telephone Outreach Unsuccessful (consecutive) telephone Outreach attempt #2- previously engaged patient  08/23/2018  [name] Sep 15, 1950 [id number]  12:00 noon:  Unsuccessful telephone outreach to [name], 68 y/o femalepatient referred to [place] by inpatient hospital CSW for community resource needs after patient had ED visit yesterday for atypical chest pain. Patient was discharged home with home health services through [facility] post- ED visit, which patient later refused, according to home health agency. Patient has history including, but not limited to: anxiety/ depression/ bipolar; chronic bronchitis; HTN; GERD.   Was again unable to leave patient HIPAA compliant voice message, as automated outgoing voice mail box message continues to report that her voice mail box remains full and cannot accept any incoming messages  Plan:  Will place [facility] CM unsuccessful patient outreach letter in mail requesting call back in writing, as this is the second consecutive unsuccessful outreach attempt in previously engaged patient  Will re-attempt [place] telephone outreach within 4 business days if I do not hear back from patient first.  Oneta Rack, RN, BSN, [place] Coordinator [facility] Care Management  [phone]

## 2018-08-26 ENCOUNTER — Other Ambulatory Visit: Payer: Self-pay

## 2018-08-26 ENCOUNTER — Telehealth: Payer: Self-pay | Admitting: Gastroenterology
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
## 2018-08-27 ENCOUNTER — Encounter: Payer: Medicare Other | Admitting: Obstetrics and Gynecology

## 2018-08-27 NOTE — Telephone Encounter (Signed)
error 

## 2018-08-28 ENCOUNTER — Ambulatory Visit: Payer: Medicare Other | Admitting: *Deleted

## 2018-08-29 ENCOUNTER — Telehealth: Payer: Self-pay | Admitting: Pulmonary Disease

## 2018-08-29 ENCOUNTER — Other Ambulatory Visit: Payer: Self-pay

## 2018-08-29 DIAGNOSIS — J410 Simple chronic bronchitis: Secondary | ICD-10-CM

## 2018-08-29 NOTE — Telephone Encounter (Signed)
Pt called needing tubing for nebulizer. Order placed to DME for supplies. Pt also requested f/u with Dr.Simonds to discuss cough appointment has been scheduled. Pt also c/o loss of smell, fatigue, and increased couhg and states she is just not feeling well. Pt was advised to go for COVID testing. Understanding was verbalized.

## 2018-09-02 ENCOUNTER — Telehealth: Payer: Self-pay | Admitting: Gastroenterology

## 2018-09-02 NOTE — Telephone Encounter (Signed)
Tried to contact pt regarding refill request on Linzess. Pt will need to contact her previous Gastroenterologist for this refill until her appt with Dr. Allen Norris on 09/16/18. Pt transferred her care to another GI office. No refills will be given.

## 2018-09-02 NOTE — Telephone Encounter (Signed)
Pt left vm she is scheduled for an apt with Dr. Allen Norris on 09/16/18 and needs a prescription for rx Linzess 44

## 2018-09-04 ENCOUNTER — Other Ambulatory Visit: Payer: Self-pay

## 2018-09-04 ENCOUNTER — Encounter: Payer: Self-pay | Admitting: Obstetrics and Gynecology

## 2018-09-04 ENCOUNTER — Ambulatory Visit (INDEPENDENT_AMBULATORY_CARE_PROVIDER_SITE_OTHER): Payer: Medicare Other | Admitting: Obstetrics and Gynecology

## 2018-09-04 VITALS — BP 161/93 | HR 68 | Ht 63.0 in | Wt 140.4 lb

## 2018-09-04 DIAGNOSIS — R309 Painful micturition, unspecified: Secondary | ICD-10-CM | POA: Diagnosis not present

## 2018-09-04 DIAGNOSIS — N362 Urethral caruncle: Secondary | ICD-10-CM | POA: Diagnosis not present

## 2018-09-04 DIAGNOSIS — I10 Essential (primary) hypertension: Secondary | ICD-10-CM | POA: Diagnosis not present

## 2018-09-04 DIAGNOSIS — Z659 Problem related to unspecified psychosocial circumstances: Secondary | ICD-10-CM | POA: Diagnosis not present

## 2018-09-04 DIAGNOSIS — N952 Postmenopausal atrophic vaginitis: Secondary | ICD-10-CM | POA: Diagnosis not present

## 2018-09-04 LAB — POCT URINALYSIS DIPSTICK
Bilirubin, UA: NEGATIVE
Blood, UA: NEGATIVE
Glucose, UA: NEGATIVE
Ketones, UA: NEGATIVE
Leukocytes, UA: NEGATIVE
Nitrite, UA: NEGATIVE
Protein, UA: NEGATIVE
Spec Grav, UA: <=1.005 — AB (ref 1.010–1.025)
Urobilinogen, UA: 0.2 E.U./dL
pH, UA: 6 (ref 5.0–8.0)

## 2018-09-04 MED ORDER — PREMARIN 0.625 MG/GM VA CREA: 1.0000 | TOPICAL_CREAM | VAGINAL | 2 refills | Status: DC

## 2018-09-04 NOTE — Progress Notes (Signed)
Pt is present today due to urination issues and pain. Pt stated that it hurts when she urinate and stated that she noticed it yesterday.
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
## 2018-09-06 ENCOUNTER — Telehealth: Payer: Self-pay | Admitting: Pulmonary Disease

## 2018-09-06 MED ORDER — DOXYCYCLINE HYCLATE 100 MG PO TABS: 100.0000 mg | ORAL_TABLET | Freq: Two times a day (BID) | ORAL | 0 refills | Status: DC

## 2018-09-06 NOTE — Telephone Encounter (Signed)
Called and spoke to pt and relayed below recommendations.  Pt has been scheduled for for depo injection on 09/09/2018, as we do not offer nurse visits.  Pt is requesting Rx for diflucan to take with the doxy. I have recommended that pt contact PCP. Pt stated that she does not have a PCP at this time.   DS please advise.
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
## 2018-09-06 NOTE — Telephone Encounter (Signed)
It is unlikely that this is "pesticide poisoning" at this point. I have placed an order for doxycycline 100 mg BID for 5 days. Hold azithromycin while on doxycycline. She has been intolerant to prednisone in the past but has tolerated "cortisone shots". If she is willing to come in for a depomedrol 80 mg IM, you can have her do that  [employer]

## 2018-09-09 ENCOUNTER — Ambulatory Visit (INDEPENDENT_AMBULATORY_CARE_PROVIDER_SITE_OTHER): Payer: Medicare Other | Admitting: Pulmonary Disease

## 2018-09-09 ENCOUNTER — Other Ambulatory Visit: Payer: Self-pay

## 2018-09-09 ENCOUNTER — Encounter: Payer: Self-pay | Admitting: Pulmonary Disease

## 2018-09-09 ENCOUNTER — Ambulatory Visit: Payer: Medicare Other | Admitting: Pulmonary Disease

## 2018-09-09 DIAGNOSIS — J410 Simple chronic bronchitis: Secondary | ICD-10-CM | POA: Diagnosis not present

## 2018-09-09 DIAGNOSIS — K449 Diaphragmatic hernia without obstruction or gangrene: Secondary | ICD-10-CM | POA: Diagnosis not present

## 2018-09-09 DIAGNOSIS — R053 Chronic cough: Secondary | ICD-10-CM

## 2018-09-09 DIAGNOSIS — R05 Cough: Secondary | ICD-10-CM

## 2018-09-09 MED ORDER — METHYLPREDNISOLONE ACETATE 80 MG/ML IJ SUSP
80.0000 mg | Freq: Once | INTRAMUSCULAR | Status: AC
  Administered 2018-09-09: 80 mg via INTRAMUSCULAR
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
## 2018-09-09 NOTE — Addendum Note (Signed)
Addended by: Maryanna Shape A on: 09/09/2018 11:47 AM   Modules accepted: Orders

## 2018-09-09 NOTE — Telephone Encounter (Signed)
Pt seen in office on 09/09/2018. All questions were addressed.

## 2018-09-09 NOTE — Patient Instructions (Signed)
Continue current medications Depo-Medrol 80 mg administered today Fill doxycycline prescription as called in last week Follow-up as scheduled with me next week

## 2018-09-13 ENCOUNTER — Telehealth: Payer: Self-pay | Admitting: Pulmonary Disease

## 2018-09-13 NOTE — Telephone Encounter (Addendum)
ATC pt- unable to leave voicemail, due to mailbox being full.  Will call back 

## 2018-09-16 ENCOUNTER — Ambulatory Visit: Payer: Medicare Other | Admitting: Gastroenterology

## 2018-09-16 NOTE — Telephone Encounter (Signed)
Called and spoke to pt, who is requesting Rx for Diflucan.  I have recommended that pt contact PCP. Pt stated that she would like that this addressed by Dr. Alva Garnet, as he prescribed abx.   DS please advise. Thanks

## 2018-09-17 ENCOUNTER — Telehealth: Payer: Self-pay | Admitting: Pulmonary Disease

## 2018-09-17 NOTE — Telephone Encounter (Signed)
ATC pt-mailbox is full.  Will need to know how long her red eyes have been present?

## 2018-09-17 NOTE — Telephone Encounter (Signed)
Called pt at 11:34 to confirm appt and ask   COVID SCREEN questions- Pt has congestion and runny nose for 1 week     SOB and muscle aches for 1 month     Red eyes - couldn't get the length of time      Active cough - what she is being seen for   Pt got very upset with me asking questions regarding COVID SCREEN and VERY upset when I told her that she needed to come in [facility] and would be screened again along with temp. She said no, I explained that they would not let her in without being screened. Pt ended call by hanging up on me.

## 2018-09-18 ENCOUNTER — Other Ambulatory Visit: Payer: Self-pay

## 2018-09-18 ENCOUNTER — Encounter: Payer: Self-pay | Admitting: Pulmonary Disease

## 2018-09-18 ENCOUNTER — Ambulatory Visit (INDEPENDENT_AMBULATORY_CARE_PROVIDER_SITE_OTHER): Payer: Medicare Other | Admitting: Pulmonary Disease

## 2018-09-18 VITALS — BP 134/76 | HR 60 | Temp 97.7°F | Ht 63.0 in | Wt 140.0 lb

## 2018-09-18 DIAGNOSIS — R053 Chronic cough: Secondary | ICD-10-CM

## 2018-09-18 DIAGNOSIS — Z87891 Personal history of nicotine dependence: Secondary | ICD-10-CM | POA: Diagnosis not present

## 2018-09-18 DIAGNOSIS — R05 Cough: Secondary | ICD-10-CM

## 2018-09-18 DIAGNOSIS — J410 Simple chronic bronchitis: Secondary | ICD-10-CM | POA: Diagnosis not present

## 2018-09-18 NOTE — Patient Instructions (Signed)
Continue Incruse inhaler, 1 inhalation daily Continue azithromycin 250 mg every M, W, F Follow-up 6 months with Dr. Mortimer Fries.  Call sooner if needed

## 2018-09-18 NOTE — Telephone Encounter (Signed)
Pt had visit with Dr. Alva Garnet on 09/18/2018 and this matter was addressed.

## 2018-09-18 NOTE — Progress Notes (Signed)
PULMONARY OFFICE FOLLOW-UP NOTE  Requesting MD/Service: Lada. Primary MD: Derrel Nip Date of initial consultation: 07/01/18 Reason for consultation: Former smoker, chronic bronchitis  PT PROFILE: 68 y.o. female former smoker (2 PPD x11 years, quit age 56) previously followed by Dr. Raul Del with persistent symptoms of chronic bronchitis.  DATA: 11/25/16 CTA chest: No evidence of pulmonary embolism.  Bibasilar atelectasis noted.  Otherwise lungs are clear.  Large hiatal hernia noted. 05/14/17 PFTs: FVC: 3.14 L (113 %pred), FEV1: 2.49 L (117 %pred), FEV1/FVC: 79.5%, TLC: 4.43 L (90.4 %pred), DLCO 87.9 %pred    INTERVAL: Last encounter 09/09/2018.  At that time, Depo-Medrol was administered.  No major pulmonary events in the interim.  SUBJ:  She has no new pulmonary complaints.  She believes Depo-Medrol helped her symptoms of shortness of breath, chest tightness and cough.  Her chief complaint today is that she feels "exhausted" and indicates that she is under a great deal of "stress".  She has many concerns about her medications which were reviewed in detail with her.  She also has concerns about documented allergies and we reviewed these together as well and clarified changes.  She reports persistent mild cough due to posterior nasal drainage.  Her cough is productive of scant white mucus.  She denies fever, hemoptysis, purulent sputum, chest pain, lower extremity edema, calf tenderness.   Vitals:   09/18/18 1153  BP: 134/76  Pulse: 60  Temp: 97.7 F (36.5 C)  SpO2: 98%  Weight: 140 lb (63.5 kg)  Height: 5\' 3"  (1.6 m)  Room air   EXAM:  Gen: NAD HEENT: NCAT, sclerae white Neck: No JVD Lungs: breath sounds full, no wheezes or other adventitious sounds Cardiovascular: RRR, no murmurs Abdomen: Soft, nontender, normal BS Ext: without clubbing, cyanosis, edema Neuro: grossly intact Skin: Limited exam, no lesions noted   DATA:   BMP Latest Ref Rng & Units 07/11/2018 06/08/2018  04/19/2018  Glucose 70 - 99 mg/dL 103(H) 77 95  BUN 8 - 23 mg/dL 14 12 15   Creatinine 0.44 - 1.00 mg/dL 0.53 0.66 0.60  BUN/Creat Ratio 6 - 22 (calc) - - -  Sodium 135 - 145 mmol/L 135 134(L) 138  Potassium 3.5 - 5.1 mmol/L 3.6 3.6 3.9  Chloride 98 - 111 mmol/L 102 98 102  CO2 22 - 32 mmol/L 26 27 26   Calcium 8.9 - 10.3 mg/dL 8.9 9.1 9.2    CBC Latest Ref Rng & Units 07/11/2018 06/08/2018 04/19/2018  WBC 4.0 - 10.5 K/uL 4.9 5.4 4.6  Hemoglobin 12.0 - 15.0 g/dL 12.0 12.4 12.1  Hematocrit 36.0 - 46.0 % 35.5(L) 36.4 36.7  Platelets 150 - 400 K/uL 255 272 277    CXR: No new film  I have personally reviewed all chest radiographs reported above including CXRs and CT chest unless otherwise indicated  IMPRESSION:     ICD-10-CM   1. Simple chronic bronchitis (HCC)  J41.0   2. Chronic cough  R05   3. Former smoker  Z87.891      PLAN:  Continue Incruse inhaler, 1 inhalation daily Continue azithromycin 250 mg every M, W, F Follow-up 6 months with Dr. Mortimer Fries.  Call sooner if needed   An additional 15 minutes was spent with this patient discussing her overall status.  It is clear that she is profoundly lonely and has very little social support.  She admits to both of these factors and believes that this is contributing to her absence of sense of wellbeing.  I promised her that  I would communicate with Dr. Derrel Nip to see if there are any community services that might be provided to her to address the issues of loneliness and isolation.  Merton Border, MD PCCM service Mobile [phone] Pager [phone] 09/20/2018 10:50 AM

## 2018-09-18 NOTE — Telephone Encounter (Signed)
Pt had visit with Dr. Alva Garnet on 09/18/2018.

## 2018-09-19 ENCOUNTER — Telehealth: Payer: Self-pay

## 2018-09-19 ENCOUNTER — Other Ambulatory Visit: Payer: Self-pay

## 2018-09-19 DIAGNOSIS — S90122A Contusion of left lesser toe(s) without damage to nail, initial encounter: Secondary | ICD-10-CM | POA: Diagnosis not present

## 2018-09-19 NOTE — Telephone Encounter (Signed)
Angela [name] spoke with the patient to do her travel screening . The patient stated she has chills , headache, muscle pain due to her hurting her toe , cough due to COPD , rash , shortness of breath due to COPD,abdominal issues ,diarrhea due to IBS, Red eye , bruising and bleeding per the patient this is normal , fatigue ,runny nose and lose of feeling in her hands. The patient is adamant about being seen in the office. The patient saw her pulmonary Dr. In the office on 9.2.2020. What would you like done for this patient.

## 2018-09-19 NOTE — Telephone Encounter (Signed)
Copied from [place] [id number]. Topic: General - Other >> Sep 19, 2018  9:59 AM Mathis Bud wrote: Reason for CRM: Patient states she got missed call from office. No voicemail. Patient states her covid test was negative and would like to make a new patient appt with [facility]. Patient call back  [phone]

## 2018-09-19 NOTE — Telephone Encounter (Signed)
She was supposed to get tested for COVID IN July per our discussion at her office visit.  Given her symptoms,  I will NOT See HER IN THE OFFICE UNTIL SHE HAS COVID 19 testing

## 2018-09-20 NOTE — Telephone Encounter (Signed)
I called the patient to inform her that should could not be seen in the office until she has Covid 19 testing done due to her symptoms per her provider. Her mail box is full. Therefore , I could not leave a message.

## 2018-09-24 NOTE — Telephone Encounter (Signed)
Patient is calling back to speak of some needs pt has "orders for", unsure what the needs exactly are. Patient can be reached at [phone] 9/9 in the afternoon or on 9/10. Please advise

## 2018-09-25 NOTE — Telephone Encounter (Signed)
Spoke with patient and she just wanted to know if I had some information regarding her incontinence supplies. Reviewed that I do not have any resources on that but I could give her some numbers from internet search. Provided those to her but reviewed that I was not familiar with them and to make sure she checks with her home health agency first. She verbalized understanding and was appreciative for the call. She also confirmed her upcoming appointment with no further questions at this time.

## 2018-09-25 NOTE — Telephone Encounter (Signed)
Pt called to report that she did get tested via the health department. She tested negative 3 weeks ago.

## 2018-09-25 NOTE — Telephone Encounter (Signed)
Ok, then she can be scheduled

## 2018-09-26 ENCOUNTER — Telehealth: Payer: Self-pay | Admitting: Pulmonary Disease

## 2018-09-26 NOTE — Telephone Encounter (Signed)
Lm with pt's EC as well.

## 2018-09-26 NOTE — Telephone Encounter (Signed)
Unable to lm on vm to call and set up a afternoon appointment with Dr. Derrel Nip. Pt was tested for covid on 09/19/2018 appointment must be scheduled any afternoon after Sept. 25th, may have to schedule appt in October.

## 2018-09-26 NOTE — Telephone Encounter (Signed)
ATC pt to relay date/time for cov[facility] test [facility] 12:30-2:30. Unable to leave message, due to mailbox being full.  Will call back.

## 2018-09-27 NOTE — Telephone Encounter (Signed)
Called and spoke to pt. Pt stated that she is unable to make covid test.  Pt is aware that sleep study will be need to rescheduled.   Rhonda, please advise. Thanks

## 2018-09-27 NOTE — Telephone Encounter (Signed)
Selinda Eon at the [facility] will contact the patient to R/S Sleep Study.  Nothing else needed at this time. Rhonda J Cobb

## 2018-09-27 NOTE — Telephone Encounter (Signed)
Called Michele at [facility] and LMOVM for her to reach out to patient to R/S Sleep Study due to patient not being able to make her COVID test appointment.  Asked Selinda Eon to reach out to patient to R/S.  If any questions, to please contact me at [phone]. Rhonda J Cobb

## 2018-10-01 ENCOUNTER — Ambulatory Visit: Payer: Medicare Other | Admitting: Internal Medicine

## 2018-10-02 ENCOUNTER — Telehealth: Payer: Self-pay

## 2018-10-02 NOTE — Telephone Encounter (Signed)
Copied from [place] [id number]. Topic: General - Other >> Oct 01, 2018  5:17 PM Pauline Good wrote: Reason for CRM: pt want call back to r/s appt that she missed today because she was on the phone with Social Services

## 2018-10-02 NOTE — Telephone Encounter (Signed)
Tried to call patient to reschedule missed appointment. Voice box is full and unable to leave a message. This appt must be a virtual appt.

## 2018-10-09 DIAGNOSIS — M4712 Other spondylosis with myelopathy, cervical region: Secondary | ICD-10-CM | POA: Diagnosis not present

## 2018-10-10 ENCOUNTER — Encounter: Payer: Medicare Other | Admitting: Obstetrics and Gynecology

## 2018-10-12 NOTE — Progress Notes (Signed)
Cardiology Office Note  Date:  10/14/2018   ID:  Angela [name], Angela [name] 1950/11/01, MRN [id number]  PCP:  Crecencio Mc, MD   Chief Complaint  Patient presents with  . other    f/u AAtherosclerosis/ chest pain. Medications reviewed verbally.     HPI:  Patient has a past medical history of COPD Leg edema Bipolar;/personality disorder/Depression/ PTSD seen by psychiatry Former smoker, stopped in her 30s large hiatal hernia Scoliosis, On CT: Minimal aortic athero in the descending aorta No significant coronary calcification, no significant carotid calcification Who presents for f/u of her  chest pain  Last visit /televisit 05/31/2018   In follow-up today she reports fullness in her chest, constipation issues Lost appetite, which she attributes to her large hiatal hernia seen on CT scan Chronic chest pain, atypical in nature  Seen in the ER 07/11/2018, muiltiple issues, including chest pain CT angio chest: Large hiatal hernia unchanged. No significant atherosclerosis  Seen in the emergency room February 2020 for shortness of breath And left-sided chest pain Work-up negative  Seen by Dr. Philis Kendall Would likely need to undergo laparoscopic hiatal hernia repair and fundoplication, possible gastropexy.   Prior to procedure was referred to GI in [facility] for EGD colonoscopy and management of her chronic constipation  EKG personally reviewed by myself on todays visit Shows NSR rate 80 bpm, no significant ST or T wave changes  Prior CV studies:   The following studies were reviewed today: Echo 2017 - Left ventricle: The cavity size was normal. Wall thickness was   normal. Systolic function was normal. The estimated ejection   fraction was in the range of 60% to 65%. Wall motion was normal;   there were no regional wall motion abnormalities. Doppler   parameters are consistent with abnormal left ventricular   relaxation (grade 1 diastolic dysfunction). - Mitral valve:  There was mild regurgitation. - Right atrium: The right atrium appears to be compressed by an   outside structure likely the known hiatal hernia.   PMH:   has a past medical history of Allergic rhinitis, Anemia, Blurred vision, Depression, Diverticulosis, Endometriosis, Falls, GERD (gastroesophageal reflux disease), Hernia, hiatal, Hypothyroidism, IBS (irritable bowel syndrome), Malignant neoplasm of skin, Migraine, Neuropathy, PTSD (post-traumatic stress disorder), and Thyroid disease.  PSH:    Past Surgical History:  Procedure Laterality Date  . APPENDECTOMY    . CHOLECYSTECTOMY    . CHOLECYSTECTOMY, LAPAROSCOPIC    . ESOPHAGOGASTRODUODENOSCOPY (EGD) WITH PROPOFOL N/A 03/29/2015   Procedure: ESOPHAGOGASTRODUODENOSCOPY (EGD) WITH PROPOFOL;  Surgeon: Josefine Class, MD;  Location: [facility] ENDOSCOPY;  Service: Endoscopy;  Laterality: N/A;  . HERNIA REPAIR    . laproscopy    . TONSILLECTOMY    . uteral suspension      Current Outpatient Medications  Medication Sig Dispense Refill  . acetaminophen (TYLENOL) 650 MG CR tablet Take 650 mg by mouth every 8 (eight) hours as needed for pain.    . Acetylcysteine (NAC) 500 MG CAPS Take 500 mg by mouth 2 (two) times a day.    . albuterol (VENTOLIN HFA) 108 (90 Base) MCG/ACT inhaler Inhale 2 puffs into the lungs every 4 (four) hours as needed for wheezing or shortness of breath. 1 Inhaler 0  . Amino Acids-Protein Hydrolys (FEEDING SUPPLEMENT, PRO-STAT 64,) LIQD Take 30 mLs by mouth daily. 887 mL 0  . atorvastatin (LIPITOR) 20 MG tablet Take 1 tablet (20 mg total) by mouth at bedtime. 90 tablet 0  . azithromycin (ZITHROMAX) 250 MG  tablet Take 1 tablet (250 mg total) by mouth every other day. Take every Mon, Wed, Fri 30 each 5  . Biotin 10000 MCG TABS Take 10,000 mcg by mouth daily.     . Calcium Citrate (CITRACAL PO) Take 800 mg by mouth. Takes 2 (400mg ) tablets BID    . Cholecalciferol 25 MCG (1000 UT) tablet Take 1 tablet (1,000 Units total) by  mouth daily. 30 tablet 1  . Cyanocobalamin (VITAMIN B-12) 1000 MCG SUBL Place 1,000 mcg under the tongue.     . diclofenac sodium (VOLTAREN) 1 % GEL Apply small amount (2 grams) topically over knees or hands up to four times a day if needed for arthritis 100 g 0  . estradiol (ESTRING) 2 MG vaginal ring Place 2 mg vaginally every 3 (three) months. follow package directions 1 each 4  . famotidine (PEPCID) 20 MG tablet Take 1 tablet (20 mg total) by mouth 2 (two) times daily as needed for heartburn or indigestion. 180 tablet 0  . ferrous sulfate 325 (65 FE) MG tablet Take 325 mg by mouth daily with breakfast.    . fluconazole (DIFLUCAN) 150 MG tablet TAKE 1 TABLET EACH WEEK WHILE TAKING DOXYCYCLINE    . fluticasone (FLONASE) 50 MCG/ACT nasal spray Place 2 sprays into both nostrils daily.     Marland Kitchen gabapentin (NEURONTIN) 100 MG capsule Take 100 mg by mouth 2 (two) times daily.    . hydrOXYzine (ATARAX/VISTARIL) 10 MG tablet TAKE 1 TO 2 TABLETS BY MOUTH ONCE DAILY AS NEEDED    . Icosapent Ethyl (VASCEPA) 1 g CAPS TAKE 2 CAPSULES BY MOUTH TWICE DAILY (TO HELP WITH TRIGLYCERIDES) 360 capsule 0  . levalbuterol (XOPENEX) 0.63 MG/3ML nebulizer solution Take 0.63 mg by nebulization every 4 (four) hours as needed for wheezing or shortness of breath.    . levothyroxine (SYNTHROID) 100 MCG tablet Take 1 tablet (100 mcg total) by mouth daily at 6 (six) AM. 90 tablet 0  . linaclotide (LINZESS) 290 MCG CAPS capsule Take 1 capsule (290 mcg total) by mouth daily before breakfast. 30 capsule 0  . LORazepam (ATIVAN) 0.5 MG tablet TAKE 1 TABLET BY MOUTH AS NEEDED ONCE DAILY    . Magnesium 500 MG TABS Take 500 mg by mouth every other day.    . Multiple Vitamin (MULTIVITAMIN WITH MINERALS) TABS tablet Take 1 tablet by mouth daily. 30 tablet 1  . PARoxetine (PAXIL) 20 MG tablet Take 20 mg by mouth daily.    . prednisoLONE acetate (PRED FORTE) 1 % ophthalmic suspension INSTILL 1 DROP INTO EACH EYE TWICE DAILY    . pregabalin  (LYRICA) 75 MG capsule Take 75 mg by mouth 2 (two) times daily.     Marland Kitchen PREMARIN vaginal cream Place 1 Applicatorful vaginally 2 (two) times a week. 42.5 g 2  . pyridoxine (B-6) 100 MG tablet Take 1 tablet (100 mg total) by mouth 2 (two) times daily. 60 tablet 0  . tiZANidine (ZANAFLEX) 2 MG tablet Take by mouth every 6 (six) hours as needed for muscle spasms.    . traZODone (DESYREL) 50 MG tablet 50 mg at bedtime as needed. Pt takes 1/2 tablet qhs    . umeclidinium bromide (INCRUSE ELLIPTA) 62.5 MCG/INH AEPB Inhale 1 puff into the lungs daily. 60 each 5  . XIIDRA 5 % SOLN INSTILL 1 DROP INTO EACH EYE TWICE DAILY    . losartan (COZAAR) 50 MG tablet Take 1 tablet (50 mg total) by mouth daily. 90 tablet 3  No current facility-administered medications for this visit.      Allergies:   Advair diskus [fluticasone-salmeterol], Bee venom, Darvon [propoxyphene], Dicyclomine, Other, Oxycodone-acetaminophen, Peanuts [peanut oil], Penicillins, Sulfa antibiotics, Sulfacetamide sodium, Ultram [tramadol], Amikacin, Aspirin, Haloperidol, Hymenoptera venom preparations, Imipramine, Keflex [cephalexin], Lactose intolerance (gi), Risperdal [risperidone], Sulfur, Adhesive [tape], Albuterol, Ciprofloxacin, Hydroxyzine, Peanut butter flavor, and Prednisone   Social History:  The patient  reports that she quit smoking about 41 years ago. She has never used smokeless tobacco. She reports current alcohol use. She reports that she does not use drugs.   Family History:   family history includes Anxiety disorder in her brother and brother; Arthritis in her brother, brother, and sister; COPD in her father; Cancer in her mother; Depression in her father and sister; Hearing loss in her father; Heart disease in her father; Hernia in her brother and brother; Stroke in her father; Varicose Veins in her father; Vision loss in her father.    Review of Systems: Review of Systems  Constitutional: Negative.   HENT: Negative.    Respiratory: Negative.   Cardiovascular: Positive for chest pain.  Gastrointestinal: Positive for constipation.  Musculoskeletal: Negative.   Neurological: Negative.   Psychiatric/Behavioral: Negative.   All other systems reviewed and are negative.   PHYSICAL EXAM: VS:  BP 138/76 (BP Location: Left Arm, Patient Position: Sitting, Cuff Size: Normal)   Pulse 80   Ht 5\' 3"  (1.6 m)   Wt 141 lb (64 kg)   BMI 24.98 kg/m  , BMI Body mass index is 24.98 kg/m. GEN: Well nourished, well developed, in no acute distress HEENT: normal Neck: no JVD, carotid bruits, or masses Cardiac: RRR; no murmurs, rubs, or gallops,no edema  Respiratory:  clear to auscultation bilaterally, normal work of breathing GI: soft, nontender, nondistended, + BS MS: no deformity or atrophy Skin: warm and dry, no rash Neuro:  Strength and sensation are intact Psych: euthymic mood, full affect   Recent Labs: 02/26/2018: Magnesium 2.0; TSH 0.41 06/08/2018: B Natriuretic Peptide 55.0 07/11/2018: ALT 22; BUN 14; Creatinine, Ser 0.53; Hemoglobin 12.0; Platelets 255; Potassium 3.6; Sodium 135    Lipid Panel Lab Results  Component Value Date   CHOL 171 02/26/2018   HDL 60 02/26/2018   LDLCALC 92 02/26/2018   TRIG 98 02/26/2018      Wt Readings from Last 3 Encounters:  10/14/18 141 lb (64 kg)  09/18/18 140 lb (63.5 kg)  09/04/18 140 lb 6.4 oz (63.7 kg)       ASSESSMENT AND PLAN:  Problem List Items Addressed This Visit      Cardiology Problems   Benign essential HTN   Aortic atherosclerosis ([place]) - Primary     Other   Hiatal hernia   Chest pain of uncertain etiology   Chronic bipolar affective disorder (HCC)     Chest pain Atypical, no further ischemic work-up needed Pressure suspected from hiatal hernia CT reviewed, large hernia, Minimal if any aortic athero  Hiatal hernia Will call GI in [facility], see if we can move up EGD, colo Also needs management of chronic  constipation Then back to [facility] for surgery for consideration of surgery  HTN Blood pressure is well controlled on today's visit. No changes made to the medications.  Hyperlipidemia On statin  Disposition:   F/U  12 months   Total encounter time more than 25 minutes  Greater than 50% was spent in counseling and coordination of care with the patient    Signed, Esmond Plants,  M.D., Ph.D. [place], [place]

## 2018-10-14 ENCOUNTER — Ambulatory Visit (INDEPENDENT_AMBULATORY_CARE_PROVIDER_SITE_OTHER): Payer: Medicare Other | Admitting: Cardiovascular Disease

## 2018-10-14 ENCOUNTER — Encounter: Payer: Self-pay | Admitting: Cardiovascular Disease

## 2018-10-14 ENCOUNTER — Other Ambulatory Visit: Payer: Self-pay

## 2018-10-14 ENCOUNTER — Ambulatory Visit: Payer: Self-pay

## 2018-10-14 VITALS — BP 138/76 | HR 80 | Ht 63.0 in | Wt 141.0 lb

## 2018-10-14 DIAGNOSIS — K449 Diaphragmatic hernia without obstruction or gangrene: Secondary | ICD-10-CM

## 2018-10-14 DIAGNOSIS — R0789 Other chest pain: Secondary | ICD-10-CM

## 2018-10-14 DIAGNOSIS — R079 Chest pain, unspecified: Secondary | ICD-10-CM

## 2018-10-14 DIAGNOSIS — I700 Atherosclerosis of aorta: Secondary | ICD-10-CM | POA: Diagnosis not present

## 2018-10-14 DIAGNOSIS — F319 Bipolar disorder, unspecified: Secondary | ICD-10-CM

## 2018-10-14 DIAGNOSIS — I10 Essential (primary) hypertension: Secondary | ICD-10-CM | POA: Diagnosis not present

## 2018-10-14 NOTE — Patient Instructions (Addendum)
We will contact [place] (GI), [facility]  to see we can move the EGD/colo up from December Large hiatal hernia with chest pressure   Medication Instructions:  No changes  If you need a refill on your cardiac medications before your next appointment, please call your pharmacy.    Lab work: No new labs needed   If you have labs (blood work) drawn today and your tests are completely normal, you will receive your results only by: Marland Kitchen MyChart Message (if you have MyChart) OR . A paper copy in the mail If you have any lab test that is abnormal or we need to change your treatment, we will call you to review the results.   Testing/Procedures: No new testing needed   Follow-Up: At [facility], you and your health needs are our priority.  As part of our continuing mission to provide you with exceptional heart care, we have created designated Provider Care Teams.  These Care Teams include your primary Cardiologist (physician) and Advanced Practice Providers (APPs -  Physician Assistants and Nurse Practitioners) who all work together to provide you with the care you need, when you need it.  . You will need a follow up appointment as needed  . Providers on your designated Care Team:   . Murray Hodgkins, NP . Christell Faith, PA-C . Marrianne Mood, PA-C  Any Other Special Instructions Will Be Listed Below (If Applicable).  For educational health videos Log in to : [email or web address] Or : [email or web address], password : [email or web address]

## 2018-10-14 NOTE — Telephone Encounter (Signed)
Patient called stating that she needs to make an appointment with Dr Derrel Nip.  She states that she is having chest pressure that extends into her left neck and shoulder.  She states that she has an appointment with a cardiologist today in a few hours. She states that she has had this pain off and on for 1 month.  She states that it makes her SOB. Per protocol which was not completed because of her symptoms patient was directed to go to the ER for evaluation.  She refuses stating that she does not want to miss the cardiology appointment. Pt was urged to go for evaluation now. Unsure what patient will do.  Reason for Disposition . Pain also in shoulder(s) or arm(s) or jaw  Answer Assessment - Initial Assessment Questions 1. LOCATION: "Where does it hurt?"       Neck down into shoulder 2. RADIATION: "Does the pain go anywhere else?" (e.g., into neck, jaw, arms, back)     Left neck and shoulder 3. ONSET: "When did the chest pain begin?" (Minutes, hours or days)      1 month 4. PATTERN "Does the pain come and go, or has it been constant since it started?"  "Does it get worse with exertion?"      Comes and goes with pressure in her chest 5. DURATION: "How long does it last" (e.g., seconds, minutes, hours)     *No Answer* 6. SEVERITY: "How bad is the pain?"  (e.g., Scale 1-10; mild, moderate, or severe)    - MILD (1-3): doesn't interfere with normal activities     - MODERATE (4-7): interferes with normal activities or awakens from sleep    - SEVERE (8-10): excruciating pain, unable to do any normal activities       *No Answer* 7. CARDIAC RISK FACTORS: "Do you have any history of heart problems or risk factors for heart disease?" (e.g., angina, prior heart attack; diabetes, high blood pressure, high cholesterol, smoker, or strong family history of heart disease)     *No Answer* 8. PULMONARY RISK FACTORS: "Do you have any history of lung disease?"  (e.g., blood clots in lung, asthma, emphysema, birth  control pills)     *No Answer* 9. CAUSE: "What do you think is causing the chest pain?"     *No Answer* 10. OTHER SYMPTOMS: "Do you have any other symptoms?" (e.g., dizziness, nausea, vomiting, sweating, fever, difficulty breathing, cough)       *No Answer* 11. PREGNANCY: "Is there any chance you are pregnant?" "When was your last menstrual period?"       *No Answer*  Protocols used: CHEST PAIN-A-AH

## 2018-10-14 NOTE — Telephone Encounter (Signed)
Please advice  

## 2018-10-15 ENCOUNTER — Telehealth: Payer: Self-pay

## 2018-10-15 NOTE — Telephone Encounter (Signed)
Lm for pt to relay date/time of covid test for sleep study.  10/18/2018 [facility] 12[facility]30[facility]

## 2018-10-15 NOTE — Telephone Encounter (Signed)
Spoke with pt to see if she went to Cardiology yesterday and she stated that she did. She stated that Dr. Rockey Situ told her that she needs to have surgery asap for a hiatal hernia that is pressing on her heart and lungs. She stated that she has already seen the surgeon and they recommended that she have an Endoscopy asap. She was told by the GI office that they are booked out until December so she stated that Dr. Rockey Situ has reached out to them to get this appt sooner. Pt stated that she wanted me to check to see if she still had her appt with Dr. Derrel Nip in October. I explained to the pt that there was not an appt scheduled in October for her. Pt stated that I needed to look again because she personally scheduled that appt when she called and spoke with me yesterday. I explained to pt that she did not talk to me yesterday that it was someone at our call center she spoke with and she was adament that it was not it was a nurse at our office. I told the pt that it was a nurse at the call center that took her phone call and then they forward the message to our office. Scheduled pt an appt for 10/22/2018 with Dr. Derrel Nip.

## 2018-10-16 ENCOUNTER — Other Ambulatory Visit: Payer: Self-pay

## 2018-10-16 NOTE — Telephone Encounter (Signed)
Pt is aware of date/time of covid test.   

## 2018-10-16 NOTE — Telephone Encounter (Signed)
ATC- unable to leave vm due to mailbox being full.  Contacted EC and requested that pt contact our office.

## 2018-10-18 ENCOUNTER — Other Ambulatory Visit
Admission: RE | Admit: 2018-10-18 | Discharge: 2018-10-18 | Disposition: A | Discharge status: Home / Self Care | Payer: Medicare Other | Source: Ambulatory Visit | Attending: Internal Medicine | Admitting: Internal Medicine

## 2018-10-18 DIAGNOSIS — Z20828 Contact with and (suspected) exposure to other viral communicable diseases: Secondary | ICD-10-CM | POA: Diagnosis not present

## 2018-10-18 DIAGNOSIS — Z01812 Encounter for preprocedural laboratory examination: Secondary | ICD-10-CM | POA: Diagnosis not present

## 2018-10-19 LAB — SARS CORONAVIRUS 2 (TAT 6-24 HRS): SARS Coronavirus 2: NEGATIVE

## 2018-10-20 DIAGNOSIS — L282 Other prurigo: Secondary | ICD-10-CM | POA: Diagnosis not present

## 2018-10-22 ENCOUNTER — Other Ambulatory Visit: Payer: Self-pay

## 2018-10-22 ENCOUNTER — Ambulatory Visit (INDEPENDENT_AMBULATORY_CARE_PROVIDER_SITE_OTHER): Payer: Medicare Other | Admitting: Internal Medicine

## 2018-10-22 ENCOUNTER — Telehealth: Payer: Self-pay | Admitting: Internal Medicine

## 2018-10-22 DIAGNOSIS — E039 Hypothyroidism, unspecified: Secondary | ICD-10-CM | POA: Diagnosis not present

## 2018-10-22 DIAGNOSIS — E222 Syndrome of inappropriate secretion of antidiuretic hormone: Secondary | ICD-10-CM | POA: Diagnosis not present

## 2018-10-22 DIAGNOSIS — E871 Hypo-osmolality and hyponatremia: Secondary | ICD-10-CM

## 2018-10-22 DIAGNOSIS — D500 Iron deficiency anemia secondary to blood loss (chronic): Secondary | ICD-10-CM

## 2018-10-22 DIAGNOSIS — L309 Dermatitis, unspecified: Secondary | ICD-10-CM | POA: Diagnosis not present

## 2018-10-22 DIAGNOSIS — K449 Diaphragmatic hernia without obstruction or gangrene: Secondary | ICD-10-CM

## 2018-10-22 DIAGNOSIS — E559 Vitamin D deficiency, unspecified: Secondary | ICD-10-CM | POA: Diagnosis not present

## 2018-10-22 DIAGNOSIS — M205X1 Other deformities of toe(s) (acquired), right foot: Secondary | ICD-10-CM

## 2018-10-22 DIAGNOSIS — M205X2 Other deformities of toe(s) (acquired), left foot: Secondary | ICD-10-CM

## 2018-10-22 MED ORDER — TRIAMCINOLONE ACETONIDE 0.5 % EX OINT: 1.0000 "application " | TOPICAL_OINTMENT | Freq: Two times a day (BID) | CUTANEOUS | 2 refills | Status: DC

## 2018-10-22 MED ORDER — LEVALBUTEROL HCL 0.63 MG/3ML IN NEBU: 0.6300 mg | INHALATION_SOLUTION | RESPIRATORY_TRACT | 2 refills | Status: DC | PRN

## 2018-10-22 NOTE — Assessment & Plan Note (Addendum)
Large,  With significant impact on  Right atrium lung capacity and heart  Per patient. Will confirm with Dr Rockey Situ and simonds. And try to expedite GI referral
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
## 2018-10-22 NOTE — Patient Instructions (Signed)
I re sent the triamcinolone ointment in a different strength to [facility]  Referral to [place] in process  We will contact Dr Denice Paradise to see if your appointment can be moved up

## 2018-10-22 NOTE — Assessment & Plan Note (Signed)
Exam is consistent with rash secondary to flea or other insect bites.  Triamcinolone rx rewritten and sent

## 2018-10-22 NOTE — Telephone Encounter (Signed)
Received Rx request from [place] for xopenex neb solution.  This medication was prescribed during previously admission.  Former DS pt scheduled to f/u with DK.  DK please advise on refill. Thanks

## 2018-10-22 NOTE — Telephone Encounter (Signed)
Rx has been sent to preferred pharmacy. Nothing further is needed.  

## 2018-10-22 NOTE — Telephone Encounter (Signed)
Please refill.

## 2018-10-23 ENCOUNTER — Ambulatory Visit: Payer: Medicare Other | Attending: Otolaryngology

## 2018-10-23 ENCOUNTER — Telehealth: Payer: Self-pay

## 2018-10-23 ENCOUNTER — Telehealth: Payer: Self-pay | Admitting: Internal Medicine

## 2018-10-23 DIAGNOSIS — G4710 Hypersomnia, unspecified: Secondary | ICD-10-CM | POA: Insufficient documentation

## 2018-10-23 DIAGNOSIS — M205X2 Other deformities of toe(s) (acquired), left foot: Secondary | ICD-10-CM | POA: Insufficient documentation

## 2018-10-23 DIAGNOSIS — M205X1 Other deformities of toe(s) (acquired), right foot: Secondary | ICD-10-CM | POA: Insufficient documentation

## 2018-10-23 LAB — CBC WITH DIFFERENTIAL/PLATELET
Basophils Absolute: 0.1 10*3/uL (ref 0.0–0.1)
Basophils Relative: 1 % (ref 0.0–3.0)
Eosinophils Absolute: 0.1 10*3/uL (ref 0.0–0.7)
Eosinophils Relative: 1.8 % (ref 0.0–5.0)
HCT: 40.9 % (ref 36.0–46.0)
Hemoglobin: 13.9 g/dL (ref 12.0–15.0)
Lymphocytes Relative: 29 % (ref 12.0–46.0)
Lymphs Abs: 2.1 10*3/uL (ref 0.7–4.0)
MCHC: 34.1 g/dL (ref 30.0–36.0)
MCV: 92.2 fl (ref 78.0–100.0)
Monocytes Absolute: 0.4 10*3/uL (ref 0.1–1.0)
Monocytes Relative: 6.2 % (ref 3.0–12.0)
Neutro Abs: 4.5 10*3/uL (ref 1.4–7.7)
Neutrophils Relative %: 62 % (ref 43.0–77.0)
Platelets: 354 10*3/uL (ref 150.0–400.0)
RBC: 4.43 Mil/uL (ref 3.87–5.11)
RDW: 14.2 % (ref 11.5–15.5)
WBC: 7.2 10*3/uL (ref 4.0–10.5)

## 2018-10-23 LAB — COMPREHENSIVE METABOLIC PANEL
ALT: 28 U/L (ref 0–35)
AST: 30 U/L (ref 0–37)
Albumin: 4.9 g/dL (ref 3.5–5.2)
Alkaline Phosphatase: 106 U/L (ref 39–117)
BUN: 11 mg/dL (ref 6–23)
CO2: 23 mEq/L (ref 19–32)
Calcium: 10.5 mg/dL (ref 8.4–10.5)
Chloride: 102 mEq/L (ref 96–112)
Creatinine, Ser: 0.74 mg/dL (ref 0.40–1.20)
GFR: 77.45 mL/min (ref >60.00)
Glucose, Bld: 75 mg/dL (ref 70–99)
Potassium: 3.8 mEq/L (ref 3.5–5.1)
Sodium: 140 mEq/L (ref 135–145)
Total Bilirubin: 0.4 mg/dL (ref 0.2–1.2)
Total Protein: 7.8 g/dL (ref 6.0–8.3)

## 2018-10-23 LAB — TSH: TSH: 1.39 u[IU]/mL (ref 0.35–4.50)

## 2018-10-23 LAB — IBC + FERRITIN
Ferritin: 38.8 ng/mL (ref 10.0–291.0)
Iron: 91 ug/dL (ref 42–145)
Saturation Ratios: 20.6 % (ref 20.0–50.0)
Transferrin: 315 mg/dL (ref 212.0–360.0)

## 2018-10-23 LAB — VITAMIN D 25 HYDROXY (VIT D DEFICIENCY, FRACTURES): VITD: 33.21 ng/mL (ref 30.00–100.00)

## 2018-10-23 LAB — MAGNESIUM: Magnesium: 2.3 mg/dL (ref 1.5–2.5)

## 2018-10-23 NOTE — Assessment & Plan Note (Signed)
Referral to [facility] for surgical opinion

## 2018-10-23 NOTE — Telephone Encounter (Signed)
Pt has been scheduled for OV 11/25/2018. Nothing further is needed.

## 2018-10-23 NOTE — Telephone Encounter (Signed)
Copied from [place] [phone]. Topic: General - Other >> Oct 23, 2018  2:40 PM Antonieta Iba C wrote: Reason for CRM: pt called in requesting to have a call back from provider or her assistant. Pt says that she has an apt with a agency in the morning in regards to what she and PCP discuss yesterday in her visit, pt wouldn't give details but says that she really need to discuss paperwork before. Pt would like a call back today if possible.  [phone]

## 2018-10-24 ENCOUNTER — Other Ambulatory Visit: Payer: Self-pay

## 2018-10-28 ENCOUNTER — Telehealth: Payer: Self-pay | Admitting: Cardiovascular Disease

## 2018-10-28 ENCOUNTER — Telehealth: Payer: Self-pay | Admitting: Internal Medicine

## 2018-10-28 NOTE — Telephone Encounter (Signed)
Please call to discuss patient having hernia surgery.

## 2018-10-28 NOTE — Telephone Encounter (Signed)
Called and spoke to pt, who is requesting refill for xopenex neb solution. Per our records, this medication was sent in on 10/22/2018. I have made pt aware of this information.  Nothing further is needed.

## 2018-10-28 NOTE — Telephone Encounter (Signed)
Spoke with the patient. Patient sts that when she was seen by Dr. Rockey Situ on 10/14/18 he was going to contact [facility] [place] Dr. Jacquiline Doe to expedite her EGD and colonoscopy in anticipation for hiatal hernia repair. Currently scheduled for Dec 2020. She is currently scheduled with them in December. Patient is wanting to know if Dr. Rockey Situ contacted GI since she has not heard anything.  Advised the patient that that I do see where Dr. Rockey Situ mentioned in his note that they are to be contacted but I do not see any other documentation. Adv the patient that I will fwd the message to Dr. Rockey Situ and we will call back with an update.   Contactecd [facility] Dr. Lenna Sciara Rich [phone]. Spoke with [facility]. [name] sts that there is nothing scheduled for the patient. They received the ref on 10/25/18 and that the patient will need to complete a health questionnaire. Patient needs to contact them at [phone] option 1 then option 2 for GI procedures.  Called the patient back to provide an update. Patient became upset on the phone and sts that she does not need to call Dr. Rockey Situ does. Advised the patient what I was told by [facility]. Patient sts that she would like to talk with Dr. Rockey Situ asap. Adv her that Dr. Rockey Situ is currently in clinic seeing patients, I will fwd the message to call when he is available.

## 2018-10-29 ENCOUNTER — Encounter: Payer: Self-pay | Admitting: Obstetrics and Gynecology

## 2018-10-29 ENCOUNTER — Ambulatory Visit: Payer: Medicare Other | Admitting: Podiatry

## 2018-10-29 ENCOUNTER — Ambulatory Visit (INDEPENDENT_AMBULATORY_CARE_PROVIDER_SITE_OTHER): Payer: Medicare Other | Admitting: Obstetrics and Gynecology

## 2018-10-29 ENCOUNTER — Other Ambulatory Visit: Payer: Self-pay

## 2018-10-29 ENCOUNTER — Telehealth: Payer: Self-pay | Admitting: Internal Medicine

## 2018-10-29 VITALS — BP 147/88 | HR 57 | Ht 63.0 in | Wt 141.3 lb

## 2018-10-29 DIAGNOSIS — Z5900 Homelessness unspecified: Secondary | ICD-10-CM

## 2018-10-29 DIAGNOSIS — L304 Erythema intertrigo: Secondary | ICD-10-CM | POA: Diagnosis not present

## 2018-10-29 DIAGNOSIS — N952 Postmenopausal atrophic vaginitis: Secondary | ICD-10-CM

## 2018-10-29 DIAGNOSIS — L300 Nummular dermatitis: Secondary | ICD-10-CM | POA: Diagnosis not present

## 2018-10-29 MED ORDER — ESTRING 2 MG VA RING: 2.0000 mg | VAGINAL_RING | VAGINAL | 4 refills | Status: DC

## 2018-10-29 NOTE — Telephone Encounter (Signed)
Pt came in today to pick up letter. She wanted to know if we had any resources for housing and for a hospital bed for her

## 2018-10-29 NOTE — Telephone Encounter (Signed)
Spoke with pt to let her know that the letter has been printed and placed up front for pick up. Pt stated that she would be by this afternoon to pick up.

## 2018-10-29 NOTE — Telephone Encounter (Signed)
It is in the chart.  I 'm pretty sure I put it up front,  But I have printed it again

## 2018-10-29 NOTE — Progress Notes (Signed)
    GYNECOLOGY PROGRESS NOTE  Subjective:    Patient ID: Angela [name], female    DOB: 1950/03/22, 68 y.o.   MRN: [id number]  HPI  Patient is a 68 y.o. [id number] female who presents for vaginal Estring insertion.  She has a prior history of vaginal atrophy. Notes difficulty with inserting the ring herself.   The following portions of the patient's history were reviewed and updated as appropriate: allergies, current medications, past family history, past medical history, past social history, past surgical history and problem list.  Review of Systems Pertinent items noted in HPI and remainder of comprehensive ROS otherwise negative.   Objective:   Blood pressure (!) 147/88, pulse (!) 57, height 5\' 3"  (1.6 m), weight 141 lb 4.8 oz (64.1 kg). General appearance: alert and no distress Abdomen: soft, non-tender; bowel sounds normal; no masses,  no organomegaly Pelvic: External genitalia normal.  Speculum exam not done.  Remainder of exam deferred.   Assessment:   Vaginal atrophy  Plan:   Estring placed today. To change every 3 months.  RTC at that time.    Rubie Maid, MD [facility]

## 2018-10-29 NOTE — Progress Notes (Signed)
Pt is present to have a hormone ring insertion. Pt stated that she has the hormone ring and is unable to insert it on her own.

## 2018-10-29 NOTE — Telephone Encounter (Signed)
Spoke with pt this morning about her labs and she stated that she had not received a letter about the rash that was seen on her body at her last office visit.

## 2018-10-30 NOTE — Telephone Encounter (Signed)
Noted  

## 2018-10-30 NOTE — Telephone Encounter (Signed)
You placed a referral to [facility] for pt didn't you?

## 2018-10-30 NOTE — Telephone Encounter (Signed)
I have talked to Angela [name] She will call [name] at [facility] and get the health questionnaire that she is supposed to complete

## 2018-10-30 NOTE — Telephone Encounter (Signed)
Not yet.  I was waiting for a message

## 2018-10-31 ENCOUNTER — Other Ambulatory Visit: Payer: Self-pay | Admitting: *Deleted

## 2018-10-31 ENCOUNTER — Encounter: Payer: Self-pay | Admitting: *Deleted

## 2018-10-31 NOTE — Patient Outreach (Addendum)
[place] [facility]) Care Management [place] Coordination Routine MD referral 10/30/2018  10/31/2018  Angela [name] 02/12/1950 [id number]  [place] Telephone Outreach Care Coordination re:  Angela [name], 68 y/o femalepatient previously active with [facility] [facility]; patient has had no subsequent hospitalizations. Patient has history including, but not limited to: anxiety/ depression/ bipolar; chronic bronchitis; HTN; GERD  Routine MD referral received 10/30/2018 for patient housing resources/ need for hospital bed.   11:05 am:  [place] who was previously involved in patient's care for community resources; explained referral source and reason, and discussed patient case with Museum/gallery conservator; discussed that I would place [facility] CSW referral as the referral appears to be for community housing resources.   Plan:  Will place [facility] CSW/ BSW referral for assessment of community housing resources available to patient, as requested in MD referral  Will make patient's PCP aware that order for hospital bed should be handled with PCP order for same  Will collaborate with [facility] CSW/ BSW and follow up within 10 business days, as indicated  Oneta Rack, RN, BSN, [employer] Coordinator [facility] Care Management  [phone]

## 2018-11-04 ENCOUNTER — Other Ambulatory Visit: Payer: Self-pay

## 2018-11-04 ENCOUNTER — Ambulatory Visit: Payer: Medicare Other | Admitting: Podiatry

## 2018-11-04 NOTE — Patient Outreach (Signed)
[place] [facility]) Care Management  11/04/2018  Kinlyn Dundee January 05, 1951 [id number]   Attempted to contact patient today regarding social work referral for assistance with housing resources.  Patient did not answer and voicemail box is full.  Mailed Unsuccessful outreach Letter.  Will attempt to reach again within four business days.  Ronn Melena, BSW Social Worker [phone]

## 2018-11-05 ENCOUNTER — Telehealth: Payer: Self-pay | Admitting: Cardiovascular Disease

## 2018-11-05 ENCOUNTER — Other Ambulatory Visit: Payer: Self-pay | Admitting: Family Medicine

## 2018-11-05 DIAGNOSIS — E039 Hypothyroidism, unspecified: Secondary | ICD-10-CM

## 2018-11-05 NOTE — Telephone Encounter (Signed)
Routing to Dr Gollan. 

## 2018-11-05 NOTE — Telephone Encounter (Signed)
Patient demands to be seen by Dr. Rockey Situ immediately as he called her personally to discuss an urgent surgery.    Advised patient per chart notes and clinic staff to call [facility] and speak with Genoveva Ill about this surgery.  patient states she was told she could schedule with any provider doing the procedure but wants to establish with Dr. Denice Paradise.  She was told she could not do this . Patient wants Dr. Rockey Situ to fix this.    Patient upset that front office staff doesn't know Dr. Gwenyth Ober schedule at all times.   Patient upset that she cannot speak with Dr. Rockey Situ directly now and states she doesn't have a phone for a call back .   Patient demands to be seen immediately.  Patient aware Dr. Rockey Situ will be notified and that she should follow his last instructions in the chart and contact sharri at [facility] .  Contact information given to patient.

## 2018-11-06 NOTE — Telephone Encounter (Signed)
She does not need an appointment with cardiology at this time I talked to her on the phone about her hiatal hernia Now it is between the GI doctor at [facility] and her She needs to fill in the paperwork to get an appointment to have EGD prior to having surgery for hiatal hernia It is out of my hands. My understanding is she may need to fill out a health questionnaire and get the appointment with Dr. Denice Paradise Appointment in December with GI will be acceptable as it is not life-threatening

## 2018-11-06 NOTE — Telephone Encounter (Signed)
Call attempted. NA/VM full.

## 2018-11-07 ENCOUNTER — Other Ambulatory Visit: Payer: Self-pay

## 2018-11-07 ENCOUNTER — Ambulatory Visit: Payer: Self-pay

## 2018-11-07 ENCOUNTER — Telehealth: Payer: Self-pay

## 2018-11-07 NOTE — Patient Outreach (Signed)
[place] [facility]) Care Management  11/07/2018  Angela [name] May 29, 1950 [id number]   Second attempt to contact patient today regarding social work referral for assistance with housing resources.  Patient did not answer and voicemail box is full.  Mailed Unsuccessful outreach Letter on 11/04/18 .  Will attempt to reach again within four business days.  Ronn Melena, BSW Social Worker [phone]

## 2018-11-07 NOTE — Telephone Encounter (Signed)
Copied from [place] [id number]. Topic: General - Inquiry >> Nov 07, 2018  2:46 PM Scherrie Gerlach wrote: Reason for CRM:  pt wants Dr Derrel Nip to know she saw a skin specialist, but wants to talk to Dr Derrel Nip directly when she has a chance. Also about home health issues

## 2018-11-08 ENCOUNTER — Other Ambulatory Visit: Payer: Self-pay | Admitting: Internal Medicine

## 2018-11-08 ENCOUNTER — Other Ambulatory Visit: Payer: Self-pay

## 2018-11-08 ENCOUNTER — Ambulatory Visit: Payer: Self-pay | Admitting: *Deleted

## 2018-11-08 ENCOUNTER — Ambulatory Visit: Payer: Self-pay

## 2018-11-08 DIAGNOSIS — Z9114 Patient's other noncompliance with medication regimen: Secondary | ICD-10-CM

## 2018-11-08 DIAGNOSIS — Z748 Other problems related to care provider dependency: Secondary | ICD-10-CM

## 2018-11-08 DIAGNOSIS — F8082 Social pragmatic communication disorder: Secondary | ICD-10-CM

## 2018-11-08 DIAGNOSIS — Z5900 Homelessness unspecified: Secondary | ICD-10-CM

## 2018-11-08 MED ORDER — LACTULOSE 20 GM/30ML PO SOLN: ORAL | 3 refills | Status: DC

## 2018-11-08 NOTE — Telephone Encounter (Signed)
Pt called with having problems having a bowel movement. She had a bowel movement last night, some of the stool is hard and some is diarrhea.  She is so uncomfortable now and can not really take a deep breath because of "so much being in her belly".  She is  "backed up".  She did not want to be triaged.  She is requesting per pcp to give her a call back with advice of what she should do or can take. She is advised to go to the ED if she has abd pain and issues with emptying her bowels which makes it hard to take a deep breath.  Will notify LB at [facility]. Routing to the practice for review and call back today.

## 2018-11-08 NOTE — Telephone Encounter (Signed)
See previous message

## 2018-11-08 NOTE — Patient Outreach (Signed)
[place] [facility]) Care Management  11/08/2018  Simrun Laroque Sep 30, 1950 [id number]    Third attempt to contact patient today regarding social work referral for assistance with housing resources. Patient did not answer and voicemail box is full. Mailed Unsuccessful outreach Letter on 11/04/18 . Will close case if no response by 11/15/18.  Ronn Melena, BSW Social Worker [phone]

## 2018-11-08 NOTE — Telephone Encounter (Signed)
Please tell her that I  do not call patients back and I do not personally call [facility], that's what social workers are for.   I have made a referral to [place] services  to assist with her multiple social work issues., and will refer to Catie Darnelle Maffucci  our pharmacist to help with her meds

## 2018-11-08 NOTE — Telephone Encounter (Signed)
THE GAS IS OCCURRING LIKELY BECAUSE OF THE STOOL STAYING IN HER COLON FOR SO LONG,  OR DUE TO A HIGH FIBER,CRUCIFEROUS VEGETABLE DIET (BROCCOLI, CAULIFLOWER).  SHE CAN USE MITRATE OF MAGNESIUM FOR THE CONSTIPATION AND GAS X FOR THE GAS.    THE REFERRAL TO [facility]  WAS MADE ON October 6TH AND THEY MADE SEVERAL ATTEMPTS TO CONTACT HER ,  BUT WERE UNABLE TO,  SO SHE NEEDS TO CALL THEM DIRECTLY AT [phone]  I WILL NOT BE RETURNING ANY MORE MESSAGES TONIGHT.

## 2018-11-08 NOTE — Telephone Encounter (Signed)
If she has not tried Milk of magnesium,  She can try that , whic is OTC  I have sent Lactulose to her pharmacy to use if she ca't get the other .  30 cc's every 4 hours until constipation is relieved

## 2018-11-08 NOTE — Telephone Encounter (Signed)
Patient said that she has been advised by doctors to stay away from the hospital and urgent care due to her  upcoming procedures.  Patient would like some advisement on her bowel issues why does she is having so much gas? How can she empty out her bowels?  Also would like to know about the referral to [facility].  Patient is asking for an answer today and would like advisement before the weekend.

## 2018-11-08 NOTE — Telephone Encounter (Signed)
Called patient back to give advisement.  No answer.  Unable to leave a voice message.  No voicemail box was set up.

## 2018-11-08 NOTE — Telephone Encounter (Signed)
Patient said that she has tried Milk of Magnesium in the past but it didn't really work for her and she has recently been taking the Lactulose and said that it doesn't work either.  Patient said that the only thing that has really worked for her in the past is the OTC Magnesium Citrate but patient said that she is unable to get to the store and has no one to go for her.  Patient said that her stomach is swollen and it looks like she "carrying triplets".  Patient said that her stomach has been this size for over a week.  Pt said that she had a bowl movement last night but it was mostly diarrhea and water with some pieces of hard stool.  Patient said that she is also unable to walk due to her feet and is in a lot of pain and wanted to follow-up with Dr. Derrel Nip about an urgent referral to [facility] foot [facility].    Patient requested that Dr. Derrel Nip put in a call to [place] or [facility] sine she is unable to get her meds.  Patient also requesting that Dr. Derrel Nip calls her.  Advised patient to go to Urgent Care for evaluation given pain and her stomach size but patient declined stating that she did not want to go urgent care or the emergency room to be put at risk.  Patient would like for Dr. Derrel Nip to call her back today.

## 2018-11-08 NOTE — Telephone Encounter (Signed)
Tried calling patient again before leaving office.  No answer.  Unable to leave a message since patient did not have voicemail box set up.

## 2018-11-08 NOTE — Addendum Note (Signed)
Addended by: Crecencio Mc on: 11/08/2018 12:37 PM   Modules accepted: Orders

## 2018-11-11 NOTE — Telephone Encounter (Signed)
No I have done what I can about expediting her GI appointment   .

## 2018-11-11 NOTE — Telephone Encounter (Signed)
Spoke with pt to let her know of the medications that she can use to help with the constipation. Pt stated that she is not able to get to a store to get the medications today but will get them as soon as she can. Pt stated that she has an appt with [facility] on Wednesday. Pt also wanted to know if there was any way to get in with a GI doctor at [facility] any sooner than December. Pt stated that she has been calling and leaving messages but has not gotten any response from their office.

## 2018-11-12 ENCOUNTER — Other Ambulatory Visit: Payer: Self-pay | Admitting: *Deleted

## 2018-11-12 ENCOUNTER — Encounter: Payer: Self-pay | Admitting: *Deleted

## 2018-11-12 NOTE — Patient Outreach (Signed)
[place] [facility]) Care Management [place]- Routine PCP referral Unsuccessful (consecutive) telephone outreach attempt #1- new patient 11/12/2018  [name] 1950/07/01 [id number]  Unsuccessful [facility] CM Telephone Outreach attempt # 1 re:  [name], 68 y/o femalepatient previously active with [place] CM; patient has had no subsequent hospitalizations. Patient has history including, but not limited to: anxiety/ depression/ bipolar; chronic bronchitis; HTN; GERD  Routine MD referral received 10/30/2018 for patient housing resources/ need for hospital bed.  At time of call attempt today, patient's phone would not connect; appeared that call (may have been) answered, but then immediately was disconnected with each call attempt.  Unable to leave patient voice message requesting call back.  Plan:  Will place [facility] Community CM unsuccessful patient outreach letter in mail requesting call back in writing  Will re-attempt [place] telephone outreach within 4 business days if I do not hear back from patient first.   Oneta Rack, RN, BSN, [employer] Coordinator [facility] Care Management  [phone]

## 2018-11-13 DIAGNOSIS — M7661 Achilles tendinitis, right leg: Secondary | ICD-10-CM | POA: Diagnosis not present

## 2018-11-13 DIAGNOSIS — M2012 Hallux valgus (acquired), left foot: Secondary | ICD-10-CM | POA: Diagnosis not present

## 2018-11-13 DIAGNOSIS — M79672 Pain in left foot: Secondary | ICD-10-CM | POA: Diagnosis not present

## 2018-11-13 DIAGNOSIS — M2011 Hallux valgus (acquired), right foot: Secondary | ICD-10-CM | POA: Diagnosis not present

## 2018-11-13 DIAGNOSIS — M79671 Pain in right foot: Secondary | ICD-10-CM | POA: Diagnosis not present

## 2018-11-14 NOTE — Telephone Encounter (Signed)
Spoke with pt and she stated that she would like to be referred to another GI facility within the [facility] system because the one she was recently referred to is only doing virtual visits right now and she wants to see someone in person.

## 2018-11-14 NOTE — Telephone Encounter (Signed)
Melissa,  Is there any way of determining which specialists at [facility] are seeing patients in person ? Patient is set up for a virtual with Jacquiline Doe (GI at [facility]) and wants to change to whoever will see her face to face  For evaluation of large hiatal hernia.

## 2018-11-15 ENCOUNTER — Other Ambulatory Visit: Payer: Self-pay | Admitting: *Deleted

## 2018-11-15 ENCOUNTER — Encounter: Payer: Self-pay | Admitting: *Deleted

## 2018-11-15 ENCOUNTER — Other Ambulatory Visit: Payer: Self-pay

## 2018-11-15 NOTE — Patient Outreach (Signed)
[place] [facility]) Care Management [place] Telephone Outreach  11/15/2018  Angela [name] 1950-12-19 [id number]  Successful [facility] CM Telephone Outreach to Angela [name], 68 y/o femalepatientpreviously active with[facility] Community CM; patient has had no subsequent/ recent hospitalizations. Patient has history including, but not limited to: anxiety/ depression/ bipolar; chronic bronchitis; HTN; GERD; IBS; and hiatal hernia.  Routine MD referral received 10/30/2018 for patient housing resources/ need for hospital bed.  HIPAA/ identity verified during phone call today; Patient tells me today that she is currently asleep, due to being in pain from "legs giving out all the time;" states that she has seen a doctor in [facility] about this, but she is unclear around plan to address.  Patient recalls [facility] CM services and agrees to [facility] CM involvement in her care; patient immediately stated that she has been waiting on [facility] CSW team to contact her; I explained that [place] team has made multiple attempts to contact her over the last 2 weeks and have bene unable to leave her a voice message, due to her phone voice mail being full; I encouraged patient to consider clearing her voice mail, as it is apparent from review of EMR that this is an ongoing issue with patient's care team.  However, patient reports that she does not want her voice mail to be clear, because she "gets too many scam calls" and she does not want to receive any of these scam messages.  Given her response, I provided patient with contact information for [facility] BSW assigned to her case- I encouraged patient to contact BSW at her earliest convenience and to leave BSW a voice message should she get voice mail.  Patient requests that I make her health care team aware that she prefers phone calls after 2 pm, as she is a late sleeper.  Patient verbalizes the following needs today: -- need for nutritional supplements/  "pull up" depends for urine/ bowel leakage/ need for housing resources, although patient can not tell me where she would like to live; states multiple times that she "is very unhappy" in [place] -- ongoing need for transportation: discussed previously provided information by [place] around her [facility] transportation benefit and previous community transportation resources  -- ongoing need for resources around food acquisition and financial strain/ inability to pay bills -- patient needs another assessment from her medicaid personal care services, as this has lapsed according to patient report -- SDOH completed for: financial strain; food resources; transportation -- has and is taking medications; shared with patient that [facility] pharmacist will be attempting to contact her as well, and encouraged her to engage with [place] team once outreach is established; patient declines medication review today, stating "feels too bad today." -- briefly discussed patient's ongoing need for surgical repair of hiatal hernia; patient reports that surgical appointment to arrange surgery is pending at [facility]  I encouraged patient to proactively contact [place] as well as her insurance provider to discuss her existing benefits in place around many of her stated needs; patient verbalized agreement and understanding and stated she would do "next week."  Patient denies further issues, concerns, or problems today.  I confirmed that patient has my direct phone number, the main [facility] CM office phone number, and the [facility] CM 24-hour nurse advice phone number should issues arise prior to next scheduled [place] outreach.  Encouraged patient to contact me directly if needs, questions, issues, or concerns arise prior to  next scheduled outreach; patient agreed to do so.  3:00 pm: [place] Coordination to [employer], [facility] BSW to update on successful outreach to patient today;  shared with Museum/gallery conservator that patient verbalized plans to contact her next week, and updated Museum/gallery conservator on patient's currently stated multiple social work needs  Plan:  Patient will take medications as prescribed and will attend all scheduled provider appointments  Patient will promptly notify care providers for any new concerns/ issues/ problems that arise  Patient will engage with [facility] CSW and Pharmacy teams to have her stated needs addressed  I will make patient's PCP aware of [place] RN CM involvement in patient's care-- will send barriers letter  [place] outreach to continue with scheduled phone call next week, hopefully to complete [facility] CM initial assessment   [facility] CM Care Plan Problem One     Most Recent Value  Care Plan Problem One  Need for self-management of healthcare  and community resources, as evidenced by patient reporting  Role Documenting the Problem One  Care Management Coordinator  Care Plan for Problem One  Active  THN Long Term Goal   Over the next 60 days, patient will verbalize a plan to obtain needed surgery for hiatal hernia, as evidenced by patient reporting during [facility] RN CM outreach  [facility] Long Term Goal Start Date  11/15/18  Interventions for Problem One Long Term Goal  Discussed with patient challanges she has around scheduling consult for needed surgery for histal hernia,  encouraged patient to clear her voice mail so that her health care providers are able to leave her messages when she is not available to answer her phone  THN CM Short Term Goal #1   Over the next 30 days, patient will engage with [facility] CSW to discuss her stated need for community resources, as evidenced by patient reporting and collaboration with [facility] CSW team as indicated during [facility] RN CM outreach  [facility] CM Short Term Goal #1 Start Date  11/15/18  Interventions for Short Term Goal #1  Discussed with patient that [place] team has made multiple outreach attempts to contact her but are unable to leave  voice messages, requesting call back,  discussed previously shared resources with patient as well her [facility] [employer] transportation and OTC benefits.  Provided patient with [facility] BSW contact information and encouraged patient to contact BSW at her earliest conveneince,  I also encouraged patient to contact her insurance company around benefits that are available to her,  contacted [facility] BSW in care coordination to update on details of successful outreach to patient today  THN CM Short Term Goal #2   Over the next 30 days, patient will discuss her medications with [facility] Pharmacist at PCP office, as evidenced by patient reporting and collaboration with [place] team as indicated during [facility] RN CM outreach  [facility] CM Short Term Goal #2 Start Date  11/15/18  Interventions for Short Term Goal #2  Encouraged patient to engage with [facility] Pharmacist at PCP office once outreach is attempted,  confirmed that patient reports today that she has current medications and is taking as prescribed     I appreciate the opportunity to participate in Merlin's care,  Oneta Rack, RN, BSN, [employer] Coordinator [facility] Care Management  [phone]

## 2018-11-15 NOTE — Patient Outreach (Signed)
[facility]) Care Management  11/15/2018  Angela [name] February 15, 1950 [id number]   [facility] Social Work case closure due to inability to contact.   Ronn Melena, BSW Social Worker [phone]

## 2018-11-18 ENCOUNTER — Ambulatory Visit: Payer: Medicare Other | Admitting: Pharmacist

## 2018-11-18 ENCOUNTER — Ambulatory Visit: Payer: Self-pay | Admitting: *Deleted

## 2018-11-18 NOTE — Telephone Encounter (Signed)
Please let Ms Ferreyra know that we cannot determine who among the GI specialists is seeing patients face to face.  My scheduler does not have time to call every office and inquire about that,  She has thousands of patients to schedule and simply cannot accomodate her request.  She stated that MOST of them are seeing only virtual  Ms Kasparian should stick with Melissa Rich.

## 2018-11-18 NOTE — Chronic Care Management (AMB) (Signed)
  Chronic Care Management   Note  11/18/2018 Name: Angela [name] MRN: [id number] DOB: 1950/08/05   Subjective:  Angela [name] is a 68 y.o. year old female who is a primary care patient of Derrel Nip, Aris Everts, MD. The CCM team was consulted for assistance with chronic disease management and care coordination needs.     [name] was given information about Chronic Care Management services today including:  1. CCM service includes personalized support from designated clinical staff supervised by her physician, including individualized plan of care and coordination with other care providers 2. 24/7 contact phone numbers for assistance for urgent and routine care needs. 3. Service will only be billed when office clinical staff spend 20 minutes or more in a month to coordinate care. 4. Only one practitioner may furnish and bill the service in a calendar month. 5. The patient may stop CCM services at any time (effective at the end of the month) by phone call to the office staff. 6. The patient will be responsible for cost sharing (co-pay) of up to 20% of the service fee (after annual deductible is met).  Patient agreed to services and verbal consent obtained.   Plan: - Scheduled phone call with patient this coming Thursday.   Catie Darnelle Maffucci, PharmD, [place] Pharmacist [facility] [phone]

## 2018-11-18 NOTE — Telephone Encounter (Signed)
Short answer, no. A lot of [facility] specialist are still seeing only virtual. It is across the board per specialist so if Angela [name] is seeing virtual's then the other providers are doing the same thing.

## 2018-11-20 ENCOUNTER — Telehealth: Payer: Self-pay | Admitting: Internal Medicine

## 2018-11-20 ENCOUNTER — Telehealth: Payer: Self-pay | Admitting: Pulmonary Disease

## 2018-11-20 DIAGNOSIS — G4710 Hypersomnia, unspecified: Secondary | ICD-10-CM

## 2018-11-20 NOTE — Telephone Encounter (Signed)
Reviewed sleep study -did not show significant sleep disordered breathing or limb movements or anything else to explain her sleep issues Will suggest to cut out medications that can cause sleepiness with much as possible such as gabapentin, Atarax, lorazepam  Follow-up with her PCP

## 2018-11-20 NOTE — Telephone Encounter (Signed)
Cough with green sputum x 2 days  She has been wheezing some but states this is nothing new for her  Not having any increased SOB  No fevers, chills, body aches, sore throat  She is requesting abx- can only tolerate doxy and also states will need 2 doses of diflucan for yeast infection she expects she will get from this  Note that she is already on zithromax 250 mg 3 x per wk Please advise thanks! Allergies  Allergen Reactions  . Advair Diskus [Fluticasone-Salmeterol] Shortness Of Breath  . Bee Venom Hives  . Darvon [Propoxyphene] Anaphylaxis and Hives    Hives/throat swelling   . Dicyclomine Itching  . Other Hives    Spider bites cause hives  . Oxycodone-Acetaminophen Nausea And Vomiting and Other (See Comments)    hallucinations   . Peanuts [Peanut Oil] Anaphylaxis and Hives  . Penicillins Hives    Has patient had a PCN reaction causing immediate rash, facial/tongue/throat swelling, SOB or lightheadedness with hypotension: Unknown Has patient had a PCN reaction causing severe rash involving mucus membranes or skin necrosis: Unknown Has patient had a PCN reaction that required hospitalization: Unknown Has patient had a PCN reaction occurring within the last 10 years: Unknown If all of the above answers are "NO", then may proceed with Cephalosporin use.  . Sulfa Antibiotics Anaphylaxis, Hives and Itching  . Sulfacetamide Sodium Anaphylaxis, Hives and Itching  . Ultram [Tramadol] Hives  . Amikacin Nausea And Vomiting  . Aspirin Hives  . Haloperidol Nausea And Vomiting  . Hymenoptera Venom Preparations Hives    Reaction to spider bites  . Imipramine Hives    Reaction to Tofranil  . Keflex [Cephalexin] Hives  . Lactose   . Lactose Intolerance (Gi)   . Risperdal [Risperidone]     Serotonin syndrome  . Sulfur Hives  . Adhesive [Tape] Hives and Rash  . Albuterol Palpitations    Tolerates levalbuterol  . Ciprofloxacin Other (See Comments)    tendonopathy  . Hydroxyzine  Anxiety and Itching  . Peanut Butter Flavor Anxiety  . Prednisone Nausea And Vomiting, Anxiety and Other (See Comments)    Crazy mood swings, strange thoughts. Tolerates depomedrol

## 2018-11-20 NOTE — Telephone Encounter (Signed)
Left message for patient to call back  

## 2018-11-21 ENCOUNTER — Ambulatory Visit: Payer: Medicare Other | Admitting: Pharmacist

## 2018-11-21 ENCOUNTER — Encounter: Payer: Self-pay | Admitting: *Deleted

## 2018-11-21 ENCOUNTER — Telehealth: Payer: Self-pay | Admitting: Internal Medicine

## 2018-11-21 ENCOUNTER — Other Ambulatory Visit: Payer: Self-pay | Admitting: *Deleted

## 2018-11-21 DIAGNOSIS — Z9114 Patient's other noncompliance with medication regimen: Secondary | ICD-10-CM

## 2018-11-21 NOTE — Chronic Care Management (AMB) (Signed)
  Chronic Care Management   Note  11/21/2018 Name: Karneisha Kaczanowski MRN: [id number] DOB: 1950/08/29  Charlsey Tun is a 68 y.o. year old female who is a primary care patient of Derrel Nip, Aris Everts, MD. The CCM team was consulted for assistance with chronic disease management and care coordination needs.    Attempted to contact patient at scheduled time for medication management review. Was unable to leave a voicemail for her, as mailbox was full.  Follow up plan: - If I do not hear back, will outreach patient again in the next 1-2 weeks  Catie Darnelle Maffucci, PharmD, [place] Pharmacist [place] [phone]

## 2018-11-21 NOTE — Telephone Encounter (Signed)
ATC again, NA and no VM

## 2018-11-21 NOTE — Telephone Encounter (Signed)
Patient stated she is coughing up green phlegm this has been going on for about a week and she would like something prescribed. However, she would like a call tomorrow afternoon before anything is sent to the pharmacy to inform the provider of what she can take. The patient wakes up late in the day . I offered her a virtual visit ,but she declined because it was for 8:30.

## 2018-11-21 NOTE — Patient Outreach (Signed)
[place] [facility]) Care Management [place] Telephone Outreach  11/21/2018  Angela [name] 1950/01/27 [id number]  [place] CM Telephone Mancel Bale, 68 y/o femalepatientpreviously active with[facility] Community CM; patient has had no subsequent/ recent hospitalizations. Patient has history including, but not limited to: anxiety/ depression/ bipolar; chronic bronchitis; HTN; GERD; IBS; and hiatal hernia.  Routine MD referral received 10/30/2018 for patient housing resources/ stated need for hospital bed.  HIPAA/ identity verified during phone call today; Patient tells me today that she is "busy today with a lady here," and is unable to talk for very long this afternoon.  Stated that she has been feeling sick this week, but "feels better" today; patient sounds to be in no distress and is very pleasant throughout brief phone call today.  Patient confirms that she has been staying "busy" trying to arrange her upcoming surgery for hiatal hernia; states she has not made much progress but "is trying."  Stated that she has not yet had time to contact [place], as we discussed last week.  I made patient aware that [facility] Pharmacist embedded at Dr. Lupita Dawn practice has also been trying to contact her without success; I provided patient with contact number for [facility] Pharmacist and encouraged her to contact both [facility] BSW/ Pharmacist around her stated needs.  Patient also reports today that on Monday 11/25/2018 she will have a virtual visit with the "[place]" personal care services around re-instating her personal care services through [facility]; patient reports that she is "very happy" about this.  Patient denies further issues, concerns, or problems today.  I confirmed that patient has my direct phone number, the main [facility] CM office phone number, and the [facility] CM 24-hour nurse advice phone number should issues arise prior to next scheduled [place]  outreach.  Encouraged patient to contact me directly if needs, questions, issues, or concerns arise prior to next scheduled outreach; patient agreed to do so.  Plan:  Patient will take medications as prescribed and will attend all scheduled provider appointments  Patient will promptly notify care providers for any new concerns/ issues/ problems that arise  Patient will engage with [facility] CSW and Pharmacy teams to have her stated needs addressed  [place] outreach to continue with scheduled phone call next week, hopefully to complete [facility] CM initial assessment  [facility] CM Care Plan Problem One     Most Recent Value  Care Plan Problem One  Need for self-management of healthcare  and community resources, as evidenced by patient reporting  Role Documenting the Problem One  Care Management [place] for Problem One  Active  THN Long Term Goal   Over the next 60 days, patient will verbalize a plan to obtain needed surgery for hiatal hernia, as evidenced by patient reporting during [facility] RN CM outreach  [facility] Long Term Goal Start Date  11/15/18  Interventions for Problem One Long Term Goal  Confirmed that patient continues to work on scheduling this referral,  encouraged patient to promptly do so  TH[facility] CM Short Term Goal #1   Over the next 30 days, patient will engage with [facility] CSW to discuss her stated need for community resources, as evidenced by patient reporting and collaboration with [facility] CSW team as indicated during [facility] RN CM outreach  [facility] CM Short Term Goal #1 Start Date  11/15/18  Interventions for Short Term Goal #1  Confirmed that patient has contact information for [facility] BSW and discussed  role of [facility] BSW- encouraged patient to promptly contact [facility] BSW if she would like assistance with stated community resources  [facility] CM Short Term Goal #2   Over the next 30 days, patient will discuss her medications with [facility] Pharmacist at PCP office, as evidenced by patient reporting and  collaboration with [place] team as indicated during [facility] RN CM outreach  [facility] CM Short Term Goal #2 Start Date  11/15/18  Interventions for Short Term Goal #2  Provided contact information for [facility] Pharmacist and shared with patient that pharmacist has made multiple outreach attempts to contact patient,  encouraged patient to promptly contact pharmacy team     Oneta Rack, RN, BSN, [place] Coordinator [facility] Care Management  [phone]

## 2018-11-21 NOTE — Telephone Encounter (Signed)
Prednisone 20 mg daily for 7 days and z pak

## 2018-11-21 NOTE — Telephone Encounter (Addendum)
ATC pt-unable to leave voicemail to mailbox being full.

## 2018-11-21 NOTE — Telephone Encounter (Signed)
I called patient to advise on below, but her mailbox was full & could except messages.

## 2018-11-22 MED ORDER — FLUCONAZOLE 100 MG PO TABS: 100.0000 mg | ORAL_TABLET | Freq: Every day | ORAL | 0 refills | Status: AC

## 2018-11-22 MED ORDER — DOXYCYCLINE HYCLATE 100 MG PO TABS: 100.0000 mg | ORAL_TABLET | Freq: Two times a day (BID) | ORAL | 0 refills | Status: DC

## 2018-11-22 NOTE — Telephone Encounter (Signed)
Rx for doxy and diflucan 100mg  has been sent to preferred pharmacy, as previously prescribed.  Pt requested 2 tablets of diflucan. Pt aware and voiced her understanding.

## 2018-11-22 NOTE — Telephone Encounter (Addendum)
Spoke with patient she advised she had talked to pulmonology and received the medication she needs.  Here I am restating conversation with patient. ( why has it taken a month for some one to call me back, no not a month but 3 days.There are so many people in my house , apartment, I have so much going on , I cannot sleep for the people, so I sleep in the day time, I need to talk to Dr. Derrel Nip) started to schedule appointment scheduled virtual for Tuesday 11/26/18. Patient then " what this is December 16 th right advised patient of correct date, "patient you do this all the time to confuse me I have so much going on so many people in my home." then advised patient I feel she needs to be evaluated now at an UC or ER she said no ' you did this to me before , every time I call" I will call my therapist" advised patient she should call therapist immediately patient said she would. Last words before she disconnected call I am so tired all these people in my home. No background noise heard.

## 2018-11-22 NOTE — Telephone Encounter (Signed)
I will call later today.

## 2018-11-22 NOTE — Telephone Encounter (Signed)
Diflucan and doxycycline as prescribed before

## 2018-11-22 NOTE — Telephone Encounter (Signed)
Pt is allergy to prednisone. Please see allergy list. Pt also stated that she is on azithro three times a week.  Pt is requesting Rx for doxy and diflucan.   DK please advise. Thanks

## 2018-11-22 NOTE — Telephone Encounter (Signed)
Thank you :)

## 2018-11-25 ENCOUNTER — Ambulatory Visit: Payer: Self-pay | Admitting: Pharmacist

## 2018-11-25 ENCOUNTER — Ambulatory Visit: Payer: Medicare Other | Admitting: Internal Medicine

## 2018-11-25 ENCOUNTER — Telehealth: Payer: Medicare Other

## 2018-11-25 DIAGNOSIS — Z9114 Patient's other noncompliance with medication regimen: Secondary | ICD-10-CM

## 2018-11-25 NOTE — Chronic Care Management (AMB) (Signed)
  Chronic Care Management   Note  11/25/2018 Name: Angela [name] MRN: [id number] DOB: Oct 11, 1950  Angela [name] is a 68 y.o. year old female who is a primary care patient of Derrel Nip, Aris Everts, MD. The CCM team was consulted for assistance with chronic disease management and care coordination needs.    Attempted to contact patient for medication management review. Was unable to leave voicemail.   Per note from Reginia Naas on 11/21/2018, patient had a meeting today with [facility] personal care services about restarting PCS through Medicaid; unsure what time this was today.   Follow up plan: - If I do not hear back from patient, will attempt outreach again in the next 1-2 weeks  Catie Darnelle Maffucci, PharmD, [place] Pharmacist [place] [phone]

## 2018-11-25 NOTE — Telephone Encounter (Signed)
Spoke with PCP at visit.

## 2018-11-26 ENCOUNTER — Other Ambulatory Visit: Payer: Self-pay

## 2018-11-26 ENCOUNTER — Encounter: Payer: Self-pay | Admitting: Internal Medicine

## 2018-11-26 ENCOUNTER — Ambulatory Visit (INDEPENDENT_AMBULATORY_CARE_PROVIDER_SITE_OTHER): Payer: Medicare Other | Admitting: Internal Medicine

## 2018-11-26 VITALS — Ht 63.0 in | Wt 141.3 lb

## 2018-11-26 DIAGNOSIS — F609 Personality disorder, unspecified: Secondary | ICD-10-CM

## 2018-11-26 DIAGNOSIS — K219 Gastro-esophageal reflux disease without esophagitis: Secondary | ICD-10-CM | POA: Diagnosis not present

## 2018-11-26 DIAGNOSIS — E039 Hypothyroidism, unspecified: Secondary | ICD-10-CM

## 2018-11-26 DIAGNOSIS — M25561 Pain in right knee: Secondary | ICD-10-CM

## 2018-11-26 DIAGNOSIS — M25562 Pain in left knee: Secondary | ICD-10-CM

## 2018-11-26 DIAGNOSIS — E781 Pure hyperglyceridemia: Secondary | ICD-10-CM

## 2018-11-26 DIAGNOSIS — K449 Diaphragmatic hernia without obstruction or gangrene: Secondary | ICD-10-CM | POA: Diagnosis not present

## 2018-11-26 DIAGNOSIS — G8929 Other chronic pain: Secondary | ICD-10-CM

## 2018-11-26 MED ORDER — ATORVASTATIN CALCIUM 20 MG PO TABS: 20.0000 mg | ORAL_TABLET | Freq: Every day | ORAL | 0 refills | Status: DC

## 2018-11-26 MED ORDER — TRIAMCINOLONE ACETONIDE 0.5 % EX OINT: 1.0000 "application " | TOPICAL_OINTMENT | Freq: Two times a day (BID) | CUTANEOUS | 2 refills | Status: DC

## 2018-11-26 MED ORDER — LEVOTHYROXINE SODIUM 100 MCG PO TABS: ORAL_TABLET | ORAL | 0 refills | Status: DC

## 2018-11-26 MED ORDER — FAMOTIDINE 20 MG PO TABS: 20.0000 mg | ORAL_TABLET | Freq: Two times a day (BID) | ORAL | 0 refills | Status: DC | PRN

## 2018-11-26 MED ORDER — DICLOFENAC SODIUM 1 % TD GEL: TRANSDERMAL | 0 refills | Status: DC

## 2018-11-26 MED ORDER — VASCEPA 1 G PO CAPS: ORAL_CAPSULE | ORAL | 0 refills | Status: DC

## 2018-11-26 MED ORDER — ALBUTEROL SULFATE HFA 108 (90 BASE) MCG/ACT IN AERS: 2.0000 | INHALATION_SPRAY | RESPIRATORY_TRACT | 3 refills | Status: DC | PRN

## 2018-11-26 MED ORDER — LOSARTAN POTASSIUM 50 MG PO TABS: 50.0000 mg | ORAL_TABLET | Freq: Every day | ORAL | 3 refills | Status: DC

## 2018-11-26 MED ORDER — AZITHROMYCIN 250 MG PO TABS: 250.0000 mg | ORAL_TABLET | ORAL | 0 refills | Status: DC

## 2018-11-26 MED ORDER — FERROUS SULFATE 325 (65 FE) MG PO TABS: 325.0000 mg | ORAL_TABLET | Freq: Every day | ORAL | 0 refills | Status: DC

## 2018-11-26 MED ORDER — PREMARIN 0.625 MG/GM VA CREA: 1.0000 | TOPICAL_CREAM | VAGINAL | 0 refills | Status: DC

## 2018-11-26 MED ORDER — TRAZODONE HCL 50 MG PO TABS: 50.0000 mg | ORAL_TABLET | Freq: Every evening | ORAL | 0 refills | Status: DC | PRN

## 2018-11-26 MED ORDER — INCRUSE ELLIPTA 62.5 MCG/INH IN AEPB: 1.0000 | INHALATION_SPRAY | Freq: Every day | RESPIRATORY_TRACT | 5 refills | Status: DC

## 2018-11-26 MED ORDER — LEVALBUTEROL HCL 0.63 MG/3ML IN NEBU: 0.6300 mg | INHALATION_SOLUTION | RESPIRATORY_TRACT | 2 refills | Status: DC | PRN
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
## 2018-11-26 NOTE — Assessment & Plan Note (Addendum)
Large,  With significant impact on  Right atrium lung capacity and heart  .  GI referral made to [facility] ,  appt is nov 18.   patient  Has an immediate need for a change in body position (elevation of entire torso ) and  requires positioning of the body in ways not feasible with an ordinary bed,
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
## 2018-11-27 ENCOUNTER — Telehealth: Payer: Self-pay | Admitting: *Deleted

## 2018-11-27 ENCOUNTER — Encounter: Payer: Self-pay | Admitting: Internal Medicine

## 2018-11-27 ENCOUNTER — Other Ambulatory Visit: Payer: Self-pay | Admitting: Internal Medicine

## 2018-11-27 MED ORDER — ALBUTEROL SULFATE HFA 108 (90 BASE) MCG/ACT IN AERS: 2.0000 | INHALATION_SPRAY | RESPIRATORY_TRACT | 3 refills | Status: DC | PRN

## 2018-11-27 NOTE — Telephone Encounter (Signed)
Dismissal letter in process.

## 2018-11-27 NOTE — Telephone Encounter (Signed)
Copied from [place] [id number]. Topic: General - Other >> Nov 27, 2018  8:14 AM Leward Quan A wrote: Reason for CRM: Patient called to request a call back from Dr Derrel Nip or her nurse she did not give any information only say its regarding a conversation yesterday. Please contact her at Ph#  [phone]

## 2018-11-29 ENCOUNTER — Other Ambulatory Visit: Payer: Self-pay | Admitting: *Deleted

## 2018-11-29 ENCOUNTER — Telehealth: Payer: Self-pay | Admitting: Internal Medicine

## 2018-11-29 ENCOUNTER — Encounter: Payer: Self-pay | Admitting: *Deleted

## 2018-11-29 NOTE — Telephone Encounter (Signed)
Spoke to pt, who is requesting to extend doxycyline 5 more days.  Doxy was prescribed by DK on 11/22/2018 for 5 days. Pt stated that she typically gets 10 days of doxycyline.  DK please advise. Thanks

## 2018-11-29 NOTE — Patient Outreach (Signed)
[place] [facility]) Care Management [place] Telephone Outreach  11/29/2018  Angela [name] [birth year] [id number]  [place] Telephone Revonda Humphrey, 68 y/o femalepatientpreviously active [place]; patient has had no subsequent/ recenthospitalizations. Patient has history including, but not limited to: anxiety/ depression/ bipolar; chronic bronchitis; HTN; GERD; IBS; and hiatal hernia.Routine MD referral received 10/30/2018 for patient housing resources/ stated need for hospital bed.  With call attempt today, I was again unable to leave patient voice message requesting call back- received automated outgoing voice message stating that patient has voice mail box that is full.  Plan:  Will re-attempt [place] telephone outreach within 4 business days if I do not hear back from patient first  Oneta Rack, RN, BSN, [employer] Coordinator [facility] Care Management  [phone]

## 2018-12-02 ENCOUNTER — Telehealth: Payer: Self-pay

## 2018-12-02 ENCOUNTER — Telehealth: Payer: Self-pay | Admitting: Internal Medicine

## 2018-12-02 ENCOUNTER — Telehealth: Payer: Self-pay | Admitting: Cardiovascular Disease

## 2018-12-02 MED ORDER — DOXYCYCLINE HYCLATE 100 MG PO TABS: 100.0000 mg | ORAL_TABLET | Freq: Two times a day (BID) | ORAL | 0 refills | Status: DC

## 2018-12-02 NOTE — Telephone Encounter (Signed)
Patient dismissed from [facility] by [name] MD, effective 11/27/18. Dismissal Letter sent out by 1st class mail. [employer]

## 2018-12-02 NOTE — Telephone Encounter (Signed)
Please extend.

## 2018-12-02 NOTE — Telephone Encounter (Signed)
Pt called to report high blood pressure. Forwarded to provider for f/u.

## 2018-12-02 NOTE — Telephone Encounter (Signed)
I spoke with the patient. She is requesting to speak with Dr. Rockey Situ only about issues she is having with getting connected with the GI dept at [facility]. She states she was told by the GI office that the physician she was referred to felt the patient's situation was too serious to be pursued in the [facility] office and would need to be at the main [facility]. She states the physicians there are refusing to work with her.  She has another situation she is wanting to discuss with Dr. Rockey Situ.   I have advised her I will get the message to him and ask that he call her.  I attempted to schedule her for an e-visit but no appointments are available until 12/18/18.  The patient does not currently want to come in as she is on antibiotics.   I have advised her I am unsure as to when Dr. Rockey Situ can call her, but the message will be sent now.

## 2018-12-02 NOTE — Telephone Encounter (Signed)
Pt is aware of below recommendations. Rx for doxy has been sent to preferred pharmacy. Nothing further is needed.

## 2018-12-02 NOTE — Telephone Encounter (Signed)
Patient would like to discuss her GI referral, states she is "not getting anywhere" with the recommendation referrals Dr. Rockey Situ gave her. Please call to discuss.

## 2018-12-04 ENCOUNTER — Encounter: Payer: Self-pay | Admitting: *Deleted

## 2018-12-04 ENCOUNTER — Emergency Department
Admission: EM | Admit: 2018-12-04 | Discharge: 2018-12-04 | Disposition: A | Discharge status: Home / Self Care | Payer: Medicare Other | Attending: Emergency Medicine | Admitting: Emergency Medicine

## 2018-12-04 ENCOUNTER — Other Ambulatory Visit: Payer: Self-pay | Admitting: *Deleted

## 2018-12-04 ENCOUNTER — Encounter: Payer: Self-pay | Admitting: Emergency Medicine

## 2018-12-04 ENCOUNTER — Other Ambulatory Visit: Payer: Self-pay

## 2018-12-04 ENCOUNTER — Emergency Department: Payer: Medicare Other

## 2018-12-04 DIAGNOSIS — K449 Diaphragmatic hernia without obstruction or gangrene: Secondary | ICD-10-CM | POA: Diagnosis not present

## 2018-12-04 DIAGNOSIS — Z87891 Personal history of nicotine dependence: Secondary | ICD-10-CM | POA: Diagnosis not present

## 2018-12-04 DIAGNOSIS — R638 Other symptoms and signs concerning food and fluid intake: Secondary | ICD-10-CM | POA: Insufficient documentation

## 2018-12-04 DIAGNOSIS — Z91010 Allergy to peanuts: Secondary | ICD-10-CM | POA: Insufficient documentation

## 2018-12-04 DIAGNOSIS — Z79899 Other long term (current) drug therapy: Secondary | ICD-10-CM | POA: Insufficient documentation

## 2018-12-04 DIAGNOSIS — Z8673 Personal history of transient ischemic attack (TIA), and cerebral infarction without residual deficits: Secondary | ICD-10-CM | POA: Diagnosis not present

## 2018-12-04 DIAGNOSIS — E039 Hypothyroidism, unspecified: Secondary | ICD-10-CM | POA: Diagnosis not present

## 2018-12-04 DIAGNOSIS — R0789 Other chest pain: Secondary | ICD-10-CM

## 2018-12-04 DIAGNOSIS — R0989 Other specified symptoms and signs involving the circulatory and respiratory systems: Secondary | ICD-10-CM | POA: Diagnosis not present

## 2018-12-04 DIAGNOSIS — R079 Chest pain, unspecified: Secondary | ICD-10-CM | POA: Diagnosis not present

## 2018-12-04 DIAGNOSIS — R531 Weakness: Secondary | ICD-10-CM | POA: Diagnosis not present

## 2018-12-04 LAB — URINALYSIS, ROUTINE W REFLEX MICROSCOPIC
Bilirubin Urine: NEGATIVE
Glucose, UA: NEGATIVE mg/dL
Ketones, ur: NEGATIVE mg/dL
Leukocytes,Ua: NEGATIVE
Nitrite: NEGATIVE
Protein, ur: NEGATIVE mg/dL
Specific Gravity, Urine: 1.004 — ABNORMAL LOW (ref 1.005–1.030)
Squamous Epithelial / HPF: NONE SEEN (ref 0–5)
pH: 6 (ref 5.0–8.0)

## 2018-12-04 LAB — CBC
HCT: 38.8 % (ref 36.0–46.0)
Hemoglobin: 12.8 g/dL (ref 12.0–15.0)
MCH: 30.5 pg (ref 26.0–34.0)
MCHC: 33 g/dL (ref 30.0–36.0)
MCV: 92.4 fL (ref 80.0–100.0)
Platelets: 308 10*3/uL (ref 150–400)
RBC: 4.2 MIL/uL (ref 3.87–5.11)
RDW: 14.2 % (ref 11.5–15.5)
WBC: 5.9 10*3/uL (ref 4.0–10.5)
nRBC: 0 % (ref 0.0–0.2)

## 2018-12-04 LAB — BASIC METABOLIC PANEL
Anion gap: 8 (ref 5–15)
BUN: 19 mg/dL (ref 8–23)
CO2: 27 mmol/L (ref 22–32)
Calcium: 9 mg/dL (ref 8.9–10.3)
Chloride: 98 mmol/L (ref 98–111)
Creatinine, Ser: 0.91 mg/dL (ref 0.44–1.00)
GFR calc Af Amer: >60 mL/min (ref >60)
GFR calc non Af Amer: >60 mL/min (ref >60)
Glucose, Bld: 95 mg/dL (ref 70–99)
Potassium: 4.1 mmol/L (ref 3.5–5.1)
Sodium: 133 mmol/L — ABNORMAL LOW (ref 135–145)

## 2018-12-04 LAB — TROPONIN I (HIGH SENSITIVITY): Troponin I (High Sensitivity): 3 ng/L (ref <18)

## 2018-12-04 MED ORDER — ONDANSETRON 4 MG PO TBDP: ORAL_TABLET | ORAL | 0 refills | Status: DC

## 2018-12-04 MED ORDER — SODIUM CHLORIDE 0.9 % IV BOLUS
500.0000 mL | Freq: Once | INTRAVENOUS | Status: AC
  Administered 2018-12-04: 500 mL via INTRAVENOUS

## 2018-12-04 MED ORDER — ONDANSETRON HCL 4 MG/2ML IJ SOLN
4.0000 mg | INTRAMUSCULAR | Status: AC
  Administered 2018-12-04: 07:00:00 4 mg via INTRAVENOUS
  Filled 2018-12-04: qty 2

## 2018-12-04 NOTE — ED Notes (Signed)
Pt states she has an estrogen replacement device in her vagina that she believes is causing high blood pressure.  She would like to have it removed but has not been able to get an appt with her gynecologist.  She states she is taking twice the prescribed amount of blood pressure medication each day.

## 2018-12-04 NOTE — ED Triage Notes (Signed)
Patient to ER for c/o chest pain with nausea. Patient reports intermittent chest pain x1 month (states "I have a very large hiatal hernia that is pressing on my heart and my lungs."). Nausea began this weekend (3-4 days ago).

## 2018-12-04 NOTE — ED Provider Notes (Signed)
[facility] Emergency Department Provider Note  ____________________________________________   First MD Initiated Contact with Patient 12/04/18 [phone]     (approximate)  I have reviewed the triage vital signs and the nursing notes.   HISTORY  Chief Complaint Chest Pain    HPI Angela [name] is a 68 y.o. female with extensive past medical history as listed below which most notably included a moderate hiatal hernia, chronic orthopedic issues, and depression previously requiring a month-long stay in the [facility] behavioral health unit with ECT by Dr. Weber Cooks.  She has established care with Dr. Rockey Situ with cardiology as well as Dr. Rosita Fire  (now Dr. Mortimer Fries) with pulmonology.  She presents by EMS tonight for chest pain.  She says that she thinks it is her hiatal hernia.  She describes it as a pressure in the lower central part of her chest.  She has also had nausea for several days and decreased oral intake and she believes that she is dehydrated.  She is obviously anxious about her symptoms.  She also states that she recently called Dr. Juliann Mule and explained that she has been coughing up some green phlegm and he started her on doxycycline.  She googled the symptoms she is having and now believes that they are the result of the doxycycline as well as her hiatal hernia.  She denies fever/chills.  Nothing in particular makes her symptoms better or worse.  She denies lower abdominal pain.  She says that her urine has a foul smell and has been dark in color but it does not hurt to urinate.  She did not mention to me any vaginal issues.  No known contact with COVID-19 patients.  She describes her symptoms overall as severe.  She also expresses concern and explained in detail about how she was recently fired by her primary care physician, Dr. Derrel Nip, and how she currently does not have a primary care doctor although she has a number of specialists.        Past Medical History:    Diagnosis Date   Allergic rhinitis    Anemia    Blurred vision    Depression    Diverticulosis    Endometriosis    Falls    GERD (gastroesophageal reflux disease)    Hernia, hiatal    Hypothyroidism    IBS (irritable bowel syndrome)    Malignant neoplasm of skin    Migraine    Neuropathy    PTSD (post-traumatic stress disorder)    Thyroid disease     Patient Active Problem List   Diagnosis Date Noted   Acquired claw toe of both feet 10/23/2018   Dermatitis 10/22/2018   Cough productive of yellow sputum 08/08/2018   Malnutrition of mild degree ([place]) 08/08/2018   Mixed stress and urge urinary incontinence 08/08/2018   Magnesium deficiency 08/08/2018   Aortic atherosclerosis ([place]) 05/31/2018   Chest pain of uncertain etiology [id number]   Achilles tendon disorder 04/25/2018   Insomnia 0000000   Diastolic dysfunction [id number]   Mitral regurgitation 03/29/2018   Bronchospasm 03/15/2018   Axis II diagnosis deferred 02/20/2018   Preventative health care 02/08/2018   Polypharmacy 02/01/2018   Personality disorder ([place]) 12/25/2017   Noncompliance 12/25/2017   Medication monitoring encounter 12/07/2017   Family history of high cholesterol 12/07/2017   Arthritis of hand, degenerative 12/07/2017   Bipolar I disorder, most recent episode mixed, severe with psychotic features ([place]) 09/18/2017   Agitation 09/18/2017   Depressed mood 07/11/2017  Hallux valgus, acquired 07/05/2017   Hammer toes of both feet 07/05/2017   Unstable ankle 07/05/2017   Chronic bilateral low back pain without sciatica 06/09/2017   Tinea unguium 05/08/2017   Conductive hearing loss of right ear with unrestricted hearing of left ear 04/03/2017   Adjustment disorder with depressed mood 01/18/2017   Migraine 01/18/2017   IBS (irritable bowel syndrome) 11/17/2016   Bilateral carpal tunnel syndrome 11/17/2016   Plantar fasciitis, bilateral  11/17/2016   Cervical myofascial pain syndrome 11/17/2016   Chronic hyponatremia 11/17/2016   PTSD (post-traumatic stress disorder) 11/17/2016   Hiatal hernia 11/17/2016   Vitamin D deficiency 11/17/2016   Vitamin B12 deficiency 11/17/2016   SIADH (syndrome of inappropriate ADH production) ([place]) 09/08/2016   Cervical spondylosis with myelopathy 05/24/2016   MCI (mild cognitive impairment) with memory loss 03/30/2016   Gait abnormality 03/30/2016   Chronic bipolar affective disorder ([place]) 01/24/2016   Recurrent major depressive disorder, in partial remission ([place]) 01/24/2016   Anemia, iron deficiency 06/24/2015   Benign essential HTN 06/24/2015   History of falling 05/22/2015   History of TIA (transient ischemic attack) 04/20/2015   Hypothyroidism 04/20/2015   GERD (gastroesophageal reflux disease) 04/20/2015   Cervical disc disorder at C5-C6 level with radiculopathy 03/25/2015   Chronic neck pain 03/25/2015   Pelvic pain in female 10/30/2014   History of skin cancer 06/30/2014   Chronic sinusitis 06/30/2014   Chronic headaches 12/16/2013   Neuropathy 12/16/2013    Past Surgical History:  Procedure Laterality Date   APPENDECTOMY     CHOLECYSTECTOMY     CHOLECYSTECTOMY, LAPAROSCOPIC     ESOPHAGOGASTRODUODENOSCOPY (EGD) WITH PROPOFOL N/A 03/29/2015   Procedure: ESOPHAGOGASTRODUODENOSCOPY (EGD) WITH PROPOFOL;  Surgeon: Josefine Class, MD;  Location: [facility] ENDOSCOPY;  Service: Endoscopy;  Laterality: N/A;   HERNIA REPAIR     laproscopy     TONSILLECTOMY     uteral suspension      Prior to Admission medications   Medication Sig Start Date End Date Taking? Authorizing Provider  acetaminophen (TYLENOL) 650 MG CR tablet Take 650 mg by mouth every 8 (eight) hours as needed for pain.    [provider]  Acetylcysteine (NAC) 500 MG CAPS Take 500 mg by mouth 2 (two) times a day.    [provider]  albuterol (VENTOLIN HFA) 108 (90  Base) MCG/ACT inhaler Inhale 2 puffs into the lungs every 4 (four) hours as needed for wheezing or shortness of breath. 11/27/18   Crecencio Mc, MD  Amino Acids-Protein Hydrolys (FEEDING SUPPLEMENT, PRO-STAT 64,) LIQD Take 30 mLs by mouth daily. 04/12/18   Poulose, Bethel Born, NP  atorvastatin (LIPITOR) 20 MG tablet Take 1 tablet (20 mg total) by mouth at bedtime. 11/26/18   Crecencio Mc, MD  azithromycin (ZITHROMAX) 250 MG tablet Take 1 tablet (250 mg total) by mouth every other day. Take every Mon, Wed, Fri 11/26/18   Crecencio Mc, MD  Biotin 10000 MCG TABS Take 10,000 mcg by mouth daily.     [provider]  Calcium Citrate (CITRACAL PO) Take 800 mg by mouth. Takes 2 (400mg ) tablets BID    [provider]  Cholecalciferol 25 MCG (1000 UT) tablet Take 1 tablet (1,000 Units total) by mouth daily. 12/26/17   Clapacs, Madie Reno, MD  Cyanocobalamin (VITAMIN B-12) 1000 MCG SUBL Place 1,000 mcg under the tongue.     [provider]  diclofenac sodium (VOLTAREN) 1 % GEL Apply small amount (2 grams) topically over  knees or hands up to four times a day if needed for arthritis 11/26/18   Crecencio Mc, MD  doxycycline (VIBRA-TABS) 100 MG tablet Take 1 tablet (100 mg total) by mouth 2 (two) times daily. 12/02/18   Flora Lipps, MD  estradiol (ESTRING) 2 MG vaginal ring Place 2 mg vaginally every 3 (three) months. follow package directions 10/29/18   Rubie Maid, MD  famotidine (PEPCID) 20 MG tablet Take 1 tablet (20 mg total) by mouth 2 (two) times daily as needed for heartburn or indigestion. 11/26/18   Crecencio Mc, MD  ferrous sulfate 325 (65 FE) MG tablet Take 1 tablet (325 mg total) by mouth daily with breakfast. 11/26/18   Crecencio Mc, MD  fluconazole (DIFLUCAN) 100 MG tablet Take 1 tablet (100 mg total) by mouth daily. 11/22/18 12/22/18  Flora Lipps, MD  fluconazole (DIFLUCAN) 150 MG tablet TAKE 1 TABLET EACH WEEK WHILE TAKING DOXYCYCLINE 07/27/18   [provider]  fluticasone (FLONASE) 50 MCG/ACT nasal spray Place 2 sprays into both nostrils daily.     [provider]  gabapentin (NEURONTIN) 100 MG capsule Take 100 mg by mouth 2 (two) times daily.    [provider]  hydrOXYzine (ATARAX/VISTARIL) 10 MG tablet TAKE 1 TO 2 TABLETS BY MOUTH ONCE DAILY AS NEEDED 02/05/18   [provider]  Icosapent Ethyl (VASCEPA) 1 g CAPS TAKE 2 CAPSULES BY MOUTH TWICE DAILY (TO HELP WITH TRIGLYCERIDES) 11/26/18   Crecencio Mc, MD  Lactulose 20 GM/30ML SOLN 30 ml every 4 hours until constipation is relieved 11/08/18   Crecencio Mc, MD  levalbuterol (XOPENEX) 0.63 MG/3ML nebulizer solution Take 3 mLs (0.63 mg total) by nebulization every 4 (four) hours as needed for wheezing or shortness of breath. 11/26/18   Crecencio Mc, MD  levothyroxine (SYNTHROID) 100 MCG tablet TAKE 1 TABLET BY MOUTH ONCE DAILY AT  6AM 11/26/18   Crecencio Mc, MD  linaclotide [facility]) 290 MCG CAPS capsule Take 1 capsule (290 mcg total) by mouth daily before breakfast. 12/26/17   Clapacs, Madie Reno, MD  LORazepam (ATIVAN) 0.5 MG tablet TAKE 1 TABLET BY MOUTH AS NEEDED ONCE DAILY 02/06/18   [provider]  losartan (COZAAR) 50 MG tablet Take 1 tablet (50 mg total) by mouth daily. 11/26/18 02/24/19  Crecencio Mc, MD  Magnesium 500 MG TABS Take 500 mg by mouth every other day.    [provider]  Multiple Vitamin (MULTIVITAMIN WITH MINERALS) TABS tablet Take 1 tablet by mouth daily. 12/26/17   Clapacs, Madie Reno, MD  ondansetron (ZOFRAN ODT) 4 MG disintegrating tablet Allow 1-2 tablets to dissolve in your mouth every 8 hours as needed for nausea/vomiting 12/04/18   Hinda Kehr, MD  PARoxetine (PAXIL) 20 MG tablet Take 20 mg by mouth daily.    [provider]  prednisoLONE acetate (PRED FORTE) 1 % ophthalmic suspension INSTILL 1 DROP INTO EACH EYE TWICE DAILY 07/26/18   [provider]  pregabalin (LYRICA) 75 MG capsule Take  75 mg by mouth 2 (two) times daily.     [provider]  PREMARIN vaginal cream Place 1 Applicatorful vaginally 2 (two) times a week. 11/28/18   Crecencio Mc, MD  pyridoxine (B-6) 100 MG tablet Take 1 tablet (100 mg total) by mouth 2 (two) times daily. 12/26/17   Clapacs, Madie Reno, MD  tiZANidine (ZANAFLEX) 2 MG tablet Take by mouth every 6 (six) hours as needed for muscle spasms.  [provider]  traZODone (DESYREL) 50 MG tablet Take 1 tablet (50 mg total) by mouth at bedtime as needed. Pt takes 1/2 tablet qhs 11/26/18   Crecencio Mc, MD  triamcinolone ointment (KENALOG) 0.5 % Apply 1 application topically 2 (two) times daily. 11/26/18   Crecencio Mc, MD  umeclidinium bromide (INCRUSE ELLIPTA) 62.5 MCG/INH AEPB Inhale 1 puff into the lungs daily. 11/26/18   Crecencio Mc, MD  XIIDRA 5 % SOLN INSTILL 1 DROP INTO EACH EYE TWICE DAILY 07/27/18   [provider]    Allergies Advair diskus [fluticasone-salmeterol], Bee venom, Darvon [propoxyphene], Dicyclomine, Other, Oxycodone-acetaminophen, Peanuts [peanut oil], Penicillins, Sulfa antibiotics, Sulfacetamide sodium, Ultram [tramadol], Amikacin, Aspirin, Haloperidol, Hymenoptera venom preparations, Imipramine, Keflex [cephalexin], Lactose, Lactose intolerance (gi), Risperdal [risperidone], Sulfur, Adhesive [tape], Albuterol, Ciprofloxacin, Hydroxyzine, Peanut butter flavor, and Prednisone  Family History  Problem Relation Age of Onset   Heart disease Father    Hearing loss Father    COPD Father    Depression Father    Stroke Father    Vision loss Father    Varicose Veins Father    Cancer Mother        lung   Depression Sister    Arthritis Sister    Arthritis Brother    Hernia Brother    Anxiety disorder Brother    Arthritis Brother    Hernia Brother    Anxiety disorder Brother    Urolithiasis Neg Hx    Kidney disease Neg Hx    Kidney cancer Neg Hx    Prostate cancer Neg Hx      Social History Social History   Tobacco Use   Smoking status: Former Smoker    Quit date: 11/17/1976    Years since quitting: 42.0   Smokeless tobacco: Never Used  Substance Use Topics   Alcohol use: Yes    Comment: occ   Drug use: No    Review of Systems Constitutional: No fever/chills Eyes: No visual changes. ENT: No sore throat. Cardiovascular: +chest pain. Respiratory: Denies shortness of breath. Gastrointestinal: Nausea and upper abdominal/lower chest pain.  Decreased oral intake. Genitourinary: Negative for dysuria.  Foul-smelling urine. Musculoskeletal: Negative for neck pain.  Negative for back pain. Integumentary: Negative for rash. Neurological: Negative for headaches, focal weakness or numbness.   ____________________________________________   PHYSICAL EXAM:  VITAL SIGNS: ED Triage Vitals  Enc Vitals Group     BP 12/04/18 0247 (!) 158/86     Pulse Rate 12/04/18 0247 73     Resp 12/04/18 0247 20     Temp 12/04/18 0247 98.8 F (37.1 C)     Temp Source 12/04/18 0247 Oral     SpO2 12/04/18 0247 96 %     Weight 12/04/18 0248 64.1 kg (141 lb 5 oz)     Height 12/04/18 0248 1.6 m (5\' 3" )     Head Circumference --      Peak Flow --      Pain Score 12/04/18 0248 8     Pain Loc --      Pain Edu? --      Excl. in [place]? --     Constitutional: Alert and oriented.  No acute distress but obviously anxious. Eyes: Conjunctivae are normal.  Head: Atraumatic. Nose: No congestion/rhinnorhea. Mouth/Throat: Patient is wearing a mask. Neck: No stridor.  No meningeal signs.   Cardiovascular: Normal rate, regular rhythm. Good peripheral circulation. Grossly normal heart sounds. Respiratory: Normal respiratory effort.  No retractions. Gastrointestinal:  Soft and nondistended.  Initially the patient reacted with mild tenderness to palpation throughout the abdomen but with distraction and continued conversation the patient had no tenderness even to deep palpation of  the abdomen and was able to carry on a conversation throughout the exam.  No rebound or guarding. Musculoskeletal: No lower extremity tenderness nor edema. No gross deformities of extremities. Neurologic:  Normal speech and language. No gross focal neurologic deficits are appreciated.  Skin:  Skin is warm, dry and intact. Psychiatric: Mood and affect are anxious and her speech is somewhat pressured with some tangential conversation.  However she exhibits no SI or HI and there is no indication that she meets criteria for involuntary commitment.  ____________________________________________   LABS (all labs ordered are listed, but only abnormal results are displayed)  Labs Reviewed  BASIC METABOLIC PANEL - Abnormal; Notable for the following components:      Result Value   Sodium 133 (*)    All other components within normal limits  URINALYSIS, ROUTINE W REFLEX MICROSCOPIC - Abnormal; Notable for the following components:   Color, Urine COLORLESS (*)    APPearance CLEAR (*)    Specific Gravity, Urine 1.004 (*)    Hgb urine dipstick SMALL (*)    Bacteria, UA RARE (*)    All other components within normal limits  URINE CULTURE  CBC  TROPONIN I (HIGH SENSITIVITY)   ____________________________________________  EKG  ED ECG REPORT I, Hinda Kehr, the attending physician, personally viewed and interpreted this ECG.  Date: 12/04/2018 EKG Time: 2:53 AM Rate: 62 Rhythm: normal sinus rhythm QRS Axis: normal Intervals: normal ST/T Wave abnormalities: normal Narrative Interpretation: no evidence of acute ischemia  ____________________________________________  RADIOLOGY I, Hinda Kehr, personally viewed and evaluated these images (plain radiographs) as part of my medical decision making, as well as reviewing the written report by the radiologist.  ED MD interpretation: Moderate hiatal hernia (previously diagnosed), no evidence of pneumonia or other emergent condition.  Official  radiology report(s): Dg Chest 2 View  Result Date: 12/04/2018 CLINICAL DATA:  Chest pain and nausea. EXAM: CHEST - 2 VIEW COMPARISON:  Radiograph in CT 07/11/2018 FINDINGS: Heart is normal in size. Moderate hiatal hernia. The lungs are clear. Pulmonary vasculature is normal. No consolidation, pleural effusion, or pneumothorax. No acute osseous abnormalities are seen. Stable anterior wedging of midthoracic vertebra. IMPRESSION: 1. No acute abnormality. 2. Moderate hiatal hernia. Electronically Signed   By: Keith Rake M.D.   On: 12/04/2018 03:17    ____________________________________________   PROCEDURES   Procedure(s) performed (including Critical Care):  Procedures   ____________________________________________   INITIAL IMPRESSION / MDM / [place] / ED COURSE  As part of my medical decision making, I reviewed the following data within the [place] notes reviewed and incorporated, Labs reviewed , EKG interpreted , Old chart reviewed, Radiograph reviewed , Notes from prior ED visits and [place] Controlled Substance Database   Differential diagnosis includes, but is not limited to, hiatal hernia, biliary colic (although the patient is status post cholecystectomy), pancreatitis, ACS, pneumonia, medication side effect, anxiety.  Patient's vital signs are stable with no tachycardia no fever; although the patient is hypertensive this is likely secondary both to essential hypertension and her acute discomfort and anxiety over her symptoms.  She has been waiting in the emergency department for more than 3 hours and is stable and was resting quietly when I assessed her.  No evidence of ischemia on EKG.  She  admits to having chest pain for a month and has been seen previously by Dr. Rockey Situ.  Her high-sensitivity troponin is 3 tonight and she has a low HEAR score is low as well as me having a very low clinical concern for ACS.  I will not repeat a  high-sensitivity troponin at this time given that she has been having symptoms for a month.  Basic metabolic panel is within normal limits other than a very mildly decreased sodium of 133.  CBC is within normal limits.  Urinalysis demonstrates a very small amount of hemoglobin and rare bacteria but no WBCs.  There is no clear indication of infection although I am sending a urine culture but I do not think she would benefit from empiric antibiotics based on the results.  I had a long conversation with the patient about her symptoms.  She expressed in detail her concerns and her conflict with Dr. Derrel Nip who reportedly released her from our practice, her concerns about the incompetence of other doctors in [facility], her ongoing issues with her hiatal hernia, her chronic orthopedic issues, her history of depression, etc.  She seemed reassured after our discussion about her generally normal and reassuring results tonight including everything described above.  She still feels like she is dehydrated and I tried to explain the oral intake should be sufficient but she says that her nausea makes it difficult and is strongly requesting IV fluids.  I agreed to provide 500 mL of normal saline along with Zofran 4 mg IV and explained the plan at that point will be for discharge and outpatient follow-up as needed with any of her specialist.  I am also giving her several contact phone numbers that she may be able to use to establish new primary care doctor.  I will write her prescription for Zofran.  I explained that I do not think that she would benefit from additional antibiotics to either continue after the doxycycline or to replace the doxycycline as she has no evidence of pneumonia, no leukocytosis, etc.  She understands and agrees with the plan and again seems reassured compared to her initial presentation.  I gave my usual and customary return precautions.           ____________________________________________  FINAL CLINICAL IMPRESSION(S) / ED DIAGNOSES  Final diagnoses:  Atypical chest pain  Hiatal hernia  Decreased oral intake     MEDICATIONS GIVEN DURING THIS VISIT:  Medications  sodium chloride 0.9 % bolus 500 mL (500 mLs Intravenous New Bag/Given 12/04/18 0649)  ondansetron (ZOFRAN) injection 4 mg (4 mg Intravenous Given 12/04/18 0654)     ED Discharge Orders         Ordered    ondansetron (ZOFRAN ODT) 4 MG disintegrating tablet     12/04/18 0615          *Please note:  Helana Knable was evaluated in Emergency Department on 12/04/2018 for the symptoms described in the history of present illness. She was evaluated in the context of the global COVID-19 pandemic, which necessitated consideration that the patient might be at risk for infection with the SARS-CoV-2 virus that causes COVID-19. Institutional protocols and algorithms that pertain to the evaluation of patients at risk for COVID-19 are in a state of rapid change based on information released by regulatory bodies including the CDC and federal and state organizations. These policies and algorithms were followed during the patient's care in the ED.  Some ED evaluations and interventions may be delayed as  a result of limited staffing during the pandemic.*  Note:  This document was prepared using Dragon voice recognition software and may include unintentional dictation errors.   Hinda Kehr, MD 12/04/18 [phone]

## 2018-12-04 NOTE — Discharge Instructions (Signed)
Your workup in the Emergency Department today was reassuring.  We did not find any specific abnormalities.  We recommend you drink plenty of fluids, take your regular medications and/or any new ones prescribed today, and follow up with the doctor(s) listed in these documents as recommended.  Return to the Emergency Department if you develop new or worsening symptoms that concern you.  

## 2018-12-04 NOTE — Patient Outreach (Signed)
[facility]ela [name]) Care Management [name] Telephone Outreach, Case Closure  12/04/2018  Angela [name] 18-Jan-1950 [id number]  Successful outreach to Angela [name], 68 y/o femalepatientpreviously active with[facility]; patient has had no subsequent/ recenthospitalizations. Patient has history including, but not limited to: anxiety/ depression/ bipolar; chronic bronchitis; HTN; GERD; IBS; and hiatal hernia.Routine MD referral received 10/30/2018 for patient housing resources/statedneed for [name] bed.  Noted from review of EMR that patient had recent PCP office visit, at which time her established PCP discharged patient from practice; also noted that patient had ED visit last night and was subsequently discharged home to self-care.  Patient confirms that she continues working with GI care providers at [facility] and with local pulmonology and cardiology care providers.  Patient also confirms that she did not reach out to Angela [name] and Pharmacy teams, as previously recommended to assist her in meeting goals around stated community resource needs.  Discussed PCP office and ED visits with patient and confirmed that she was provided the physician referral line to assist her in obtaining a new PCP.  Discussed with patient that [place] CM will close patient case as she no longer has a PCP active with [facility].  Patient verbalizes understanding.  Plan:  Will close [place] patient as she is no longer eligible for [name] CM services.  [name] CM Care Plan Problem One     Most Recent Value  Care Plan Problem One  Need for self-management of healthcare  and community resources, as evidenced by patient reporting  Role Documenting the Problem One  Care Management [place] for Problem One  Not Active  [facility] Long Term Goal   Over the next 60 days, patient will verbalize a plan to obtain needed surgery for hiatal hernia, as evidenced by patient  reporting during [facility] RN CM outreach  [place] Term Goal Start Date  11/15/18  [facility] Long Term Goal Met Date  12/04/18  Interventions for Problem One Long Term Goal  Confirmed with patient that she has plans to follow up for needed surgery at [name],  discussed with patient process to obtain a new PCP and ensured that she has the phone numbers for resources to obtain new PCP,  case closed with patient  [facility] CM Short Term Goal #1   Over the next 30 days, patient will engage with [place] [name] CSW to discuss her stated need for community resources, as evidenced by patient reporting and collaboration with [name] CSW team as indicated during [facility] RN CM outreach  [name] CM Short Term Goal #1 Start Date  11/15/18  [place] [name] Short Term Goal #1 Met Date  12/04/18 [Goal not met]  Interventions for Short Term Goal #1  Confirmed that patient has not attempted outreach to [place] CM Short Term Goal #2   Over the next 30 days, patient will discuss her medications with [facility] Pharmacist at PCP office, as evidenced by patient reporting and collaboration with [place] team as indicated during [facility] RN CM outreach  [facility] CM Short Term Goal #2 Start Date  11/15/18  [facility] CM Short Term Goal #2 Met Date  12/04/18 [Goal not met]  Interventions for Short Term Goal #2  Confirmed that patient has not attempted to contact [place], RN, BSN, [place] [facility] Management  [phone]

## 2018-12-04 NOTE — ED Notes (Signed)
2 unsuccessful IV attempts by this RN (left AC and right forearm).

## 2018-12-04 NOTE — ED Notes (Signed)
Patient refusing blood work at this time.

## 2018-12-04 NOTE — ED Notes (Signed)
Pt ambulatory to STAT desk without difficulty or distress noted; st "I think I found out what the problem is...Marland KitchenMarland KitchenI'm taking doxycycline and I googled it and I have all the symptoms from taking it"

## 2018-12-05 ENCOUNTER — Ambulatory Visit: Payer: Medicare Other | Admitting: Podiatry

## 2018-12-05 ENCOUNTER — Telehealth: Payer: Medicare Other

## 2018-12-05 LAB — URINE CULTURE
Culture: NO GROWTH
Special Requests: NORMAL

## 2018-12-05 NOTE — Addendum Note (Signed)
Addended by: Crecencio Mc on: 12/05/2018 06:00 PM   Modules accepted: Orders

## 2018-12-05 NOTE — Addendum Note (Signed)
Addended by: Crecencio Mc on: 12/05/2018 06:04 PM   Modules accepted: Orders

## 2018-12-09 NOTE — Telephone Encounter (Signed)
Attempted to call patient and Voicemail box was full.

## 2018-12-09 NOTE — Telephone Encounter (Signed)
Called Almyra Free coordinator with [facility] for Dr. Cammie Sickle and left message to call back.

## 2018-12-10 NOTE — Telephone Encounter (Signed)
Spoke with Angela [name] at [name] regarding appointment and her GI requests. She expressed that on numerous occasions that she spoke with patient and testing was ordered. Patient has failed to schedule her testing because they would not do in [place] and only want to perform at [name] main campus due to her multiple health problems. Patient has upcoming appointment with Dr. Jacquiline [name] January 22, 2019. Patient has orders in place to have EGD and colonoscopy but she is demanding to see the physician doing her procedure before scheduling. They have reached out to her to assist with scheduling but she refuses without seeing provider to discuss. I have attempted to call patient and no answer with voicemail box full. It appears that she was dismissed from [facility]. After discussion with [name] patient is needing to see GI provider at [name] and can schedule her testing at any time. I have not been able to reach patient again to discuss any questions or concerns she may have. I will forward to our practice manager for review given patients multiple requests and not being able to reach her to follow up.

## 2018-12-11 ENCOUNTER — Telehealth: Payer: Self-pay | Admitting: Cardiovascular Disease

## 2018-12-11 NOTE — Telephone Encounter (Signed)
Pt c/o BP issue: STAT if pt c/o blurred vision, one-sided weakness or slurred speech  1. What are your last 5 BP readings?    [facility] at [facility] 180/110  Today    129/80's  2. Are you having any other symptoms (ex. Dizziness, headache, blurred vision, passed out)?   Pulse in ears cough cold congestion   declined to mention long list of other issues as she had supplied enough information and needed to answer another call  3. What is your BP issue? Patient feels bp is too high and is concerned about fixing this before upcoming surgery that she states she has been unable to speak with any surgeons about   Patient also mentions she needs an answer asap as she may need to move out of state or possibly out of the country soon

## 2018-12-11 NOTE — Telephone Encounter (Signed)
Patient called late yesterday afternoon, states she was returning call  Did not notice a note of call placed Patient scheduled an appointment to see Dr Rockey Situ 12/8 for an urgent matter but would not offer details

## 2018-12-11 NOTE — Telephone Encounter (Signed)
No answer/Voicemail box is full.  

## 2018-12-16 ENCOUNTER — Ambulatory Visit (INDEPENDENT_AMBULATORY_CARE_PROVIDER_SITE_OTHER): Payer: Medicare Other | Admitting: Podiatry

## 2018-12-16 ENCOUNTER — Encounter: Payer: Self-pay | Admitting: Podiatry

## 2018-12-16 DIAGNOSIS — B351 Tinea unguium: Secondary | ICD-10-CM

## 2018-12-16 DIAGNOSIS — M2041 Other hammer toe(s) (acquired), right foot: Secondary | ICD-10-CM

## 2018-12-16 DIAGNOSIS — M2042 Other hammer toe(s) (acquired), left foot: Secondary | ICD-10-CM

## 2018-12-16 DIAGNOSIS — M79676 Pain in unspecified toe(s): Secondary | ICD-10-CM

## 2018-12-16 DIAGNOSIS — M2010 Hallux valgus (acquired), unspecified foot: Secondary | ICD-10-CM

## 2018-12-16 NOTE — Telephone Encounter (Addendum)
Spoke with patient and she is trying to straighten things out with her healthcare. She is wanting to speak directly with Dr. Rockey Situ. Advised that he is currently seeing patients and not able to speak directly with her. Inquired about her concerns to see if I could assist with anything. She wanted to discuss her surgery for hiatal hernia. She was wanting to request referral, discuss what happened with Dr. Derrel Nip, and also wanting referral for other provider. She states that [facility] is not able to do her EGD until late January. She is very upset for this delay. Reviewed that due to holidays, COVID, and possible high volumes could be the reason for this delay and that she should reach out to them and schedule. She then went on to request Dr. Donivan Scull advise on moving to another country to get better health care services. She is very upset with the care she is getting here in [place]. Advised that I am not familiar with any other areas. She then went on to say that her lease was not renewed and police has also been involved. She stated that at the end of December she will then be homeless. Provided emotional support to patient but expressed that we are not able to assist in any way with her GI work up. Reviewed that GI Coordinator stated that all testing orders have been entered and they are just waiting on her to schedule all of the testing in order to see surgeon. I did provide her number to [employer] so that she can call to see if they have any providers that may be able to assist her with routine care. She wrote number down and read back to make sure it was correct. Let her know if she should have any cardiac issues then to please call back but otherwise she needs to follow up with the GI physician. She verbalized understanding of our conversation, agreement with plan, and had no further questions at this time.

## 2018-12-16 NOTE — Telephone Encounter (Signed)
Patient returning call  Patient states that she can never get anyone on the phone so she will be on her way to come sit in the lobby until someone comes to speak with her

## 2018-12-16 NOTE — Progress Notes (Signed)
Complaint:  Visit Type: Patient returns to my office for continued preventative foot care services. Complaint: Patient states" my nails have grown long and thick and become painful to walk and wear shoes"  The patient presents for preventative foot care services. No changes to ROS  Podiatric Exam: Vascular: dorsalis pedis and posterior tibial pulses are palpable bilateral. Capillary return is immediate. Temperature gradient is WNL. Skin turgor WNL  Sensorium: Normal Semmes Weinstein monofilament test. Normal tactile sensation bilaterally. Nail Exam: Pt has thick disfigured discolored nails with subungual debris noted bilateral entire nail hallux through fifth toenails Ulcer Exam: There is no evidence of ulcer or pre-ulcerative changes or infection. Orthopedic Exam: Muscle tone and strength are WNL. No limitations in general ROM. No crepitus or effusions noted. Foot type and digits show no abnormalities. HAV  B/L with hammer toes 2-5  B/L Skin: No Porokeratosis. No infection or ulcers  Diagnosis:  Onychomycosis, , Pain in right toe, pain in left toes  Treatment & Plan Procedures and Treatment: Consent by patient was obtained for treatment procedures.   Debridement of mycotic and hypertrophic toenails, 1 through 5 bilateral and clearing of subungual debris. No ulceration, no infection noted.  Return Visit-Office Procedure: Patient instructed to return to the office for a follow up visit 3 months for continued evaluation and treatment.    Gardiner Barefoot DPM

## 2018-12-17 ENCOUNTER — Telehealth: Payer: Self-pay | Admitting: Internal Medicine

## 2018-12-17 NOTE — Telephone Encounter (Signed)
ATC pt- unable to leave vm due to mailbox being full.  

## 2018-12-18 DIAGNOSIS — G894 Chronic pain syndrome: Secondary | ICD-10-CM | POA: Diagnosis not present

## 2018-12-18 DIAGNOSIS — M069 Rheumatoid arthritis, unspecified: Secondary | ICD-10-CM | POA: Diagnosis not present

## 2018-12-18 DIAGNOSIS — M130 Polyarthritis, unspecified: Secondary | ICD-10-CM | POA: Diagnosis not present

## 2018-12-18 DIAGNOSIS — Z5181 Encounter for therapeutic drug level monitoring: Secondary | ICD-10-CM | POA: Diagnosis not present

## 2018-12-18 DIAGNOSIS — Z79899 Other long term (current) drug therapy: Secondary | ICD-10-CM | POA: Diagnosis not present

## 2018-12-19 ENCOUNTER — Encounter: Payer: Self-pay | Admitting: Internal Medicine

## 2018-12-19 ENCOUNTER — Ambulatory Visit (INDEPENDENT_AMBULATORY_CARE_PROVIDER_SITE_OTHER): Payer: Medicare Other | Admitting: Internal Medicine

## 2018-12-19 DIAGNOSIS — J410 Simple chronic bronchitis: Secondary | ICD-10-CM | POA: Diagnosis not present

## 2018-12-19 NOTE — Patient Instructions (Signed)
MEDICATION ADJUSTMENTS/LABS AND TESTS ORDERED: Continue inhalers as prescribed Plan for  office IM dexamethasone

## 2018-12-19 NOTE — Telephone Encounter (Signed)
Will close encounter, as pt had telephone visit with Dr. Mortimer Fries today.

## 2018-12-19 NOTE — Progress Notes (Signed)
PULMONARY OFFICE FOLLOW-UP NOTE   I connected with the patient by telephone enabled telemedicine visit and verified that I am speaking with the correct person using two identifiers.    I discussed the limitations, risks, security and privacy concerns of performing an evaluation and management service by telemedicine and the availability of in-person appointments. I also discussed with the patient that there may be a patient responsible charge related to this service. The patient expressed understanding and agreed to proceed.  PATIENT AGREES AND CONFIRMS -YES   Other persons participating in the visit and their role in the encounter: Patient, nursing  This visit type was conducted due to national recommendations for restrictions regarding the COVID-19 Pandemic (e.g. social distancing).  This format is felt to be most appropriate for this patient at this time.  All issues noted in this document were discussed and addressed.           Requesting MD/Service: Lada. Primary MD: Derrel Nip Date of initial consultation: 07/01/18 Reason for consultation: Former smoker, chronic bronchitis  PT PROFILE: 68 y.o. female former smoker (2 PPD x11 years, quit age 10) previously followed by Dr. Raul Del with persistent symptoms of chronic bronchitis.  DATA: 11/25/16 CTA chest: No evidence of pulmonary embolism.  Bibasilar atelectasis noted.  Otherwise lungs are clear.  Large hiatal hernia noted. 05/14/17 PFTs: FVC: 3.14 L (113 %pred), FEV1: 2.49 L (117 %pred), FEV1/FVC: 79.5%, TLC: 4.43 L (90.4 %pred), DLCO 87.9 %pred    INTERVAL: Last encounter 09/09/2018.  At that time, Depo-Medrol was administered.  No major pulmonary events in the interim.  CC Follow up cough  HPI Patient on incruse and Azithromycin 3 times weekly Albuterol MDI and Levalbuterol as needed No  COPD exacerbation at this time No evidence of heart failure at this time No evidence or signs of infection at this time No respiratory  distress No fevers, chills, nausea, vomiting, diarrhea No evidence of lower extremity edema No evidence hemoptysis Has large Hiatel Hernia that contributes      Review of Systems:  Gen:  Denies  fever, sweats, chills weight loss  HEENT: Denies blurred vision, double vision, ear pain, eye pain, hearing loss, nose bleeds, sore throat Cardiac:  No dizziness, chest pain or heaviness, chest tightness,edema, No JVD Resp:   No cough, -sputum production, -shortness of breath,-wheezing, -hemoptysis,  Gi: Denies swallowing difficulty, stomach pain, nausea or vomiting, diarrhea, constipation, bowel incontinence Gu:  Denies bladder incontinence, burning urine Ext:   Denies Joint pain, stiffness or swelling Skin: Denies  skin rash, easy bruising or bleeding or hives Endoc:  Denies polyuria, polydipsia , polyphagia or weight change Psych:   Denies depression, insomnia or hallucinations  Other:  All other systems negative   DATA:   BMP Latest Ref Rng & Units 12/04/2018 10/22/2018 07/11/2018  Glucose 70 - 99 mg/dL 95 75 103(H)  BUN 8 - 23 mg/dL 19 11 14   Creatinine 0.44 - 1.00 mg/dL 0.91 0.74 0.53  BUN/Creat Ratio 6 - 22 (calc) - - -  Sodium 135 - 145 mmol/L 133(L) 140 135  Potassium 3.5 - 5.1 mmol/L 4.1 3.8 3.6  Chloride 98 - 111 mmol/L 98 102 102  CO2 22 - 32 mmol/L 27 23 26   Calcium 8.9 - 10.3 mg/dL 9.0 10.5 8.9    CBC Latest Ref Rng & Units 12/04/2018 10/22/2018 07/11/2018  WBC 4.0 - 10.5 K/uL 5.9 7.2 4.9  Hemoglobin 12.0 - 15.0 g/dL 12.8 13.9 12.0  Hematocrit 36.0 - 46.0 % 38.8 40.9 35.5(L)  Platelets 150 - 400 K/uL 308 354.0 255      CHRONIC BRONCHITIS Stable on on inhaler and neb therapy  PLAN:  Continue Incruse inhaler, 1 inhalation daily Continue azithromycin 250 mg every M, W, F Follow up ENT Follow up GI at [facility] Patient is asking for dexamethasone injection and wants to come top office for - She states that this helps alot  COVID-19 EDUCATION: The signs and symptoms  of COVID-19 were discussed with the patient and how to seek care for testing.  The importance of social distancing was discussed today. Hand Washing Techniques and avoid touching face was advised.     MEDICATION ADJUSTMENTS/LABS AND TESTS ORDERED: Continue inhalers as prescribed Plan for  office IM dexamethasone  CURRENT MEDICATIONS REVIEWED AT LENGTH WITH PATIENT TODAY   Patient satisfied with Plan of action and management. All questions answered  Follow up in 6 months Total time spent 24 mins  Maretta Bees Patricia Pesa, M.D.  [facility]  Medical Director [place] Director [facility] Cardio-Pulmonary Department

## 2018-12-20 ENCOUNTER — Other Ambulatory Visit: Payer: Self-pay

## 2018-12-20 ENCOUNTER — Telehealth: Payer: Self-pay | Admitting: Internal Medicine

## 2018-12-20 ENCOUNTER — Ambulatory Visit: Payer: Medicare Other | Admitting: Internal Medicine

## 2018-12-20 DIAGNOSIS — R053 Chronic cough: Secondary | ICD-10-CM

## 2018-12-20 DIAGNOSIS — R05 Cough: Secondary | ICD-10-CM

## 2018-12-20 DIAGNOSIS — J40 Bronchitis, not specified as acute or chronic: Secondary | ICD-10-CM

## 2018-12-20 MED ORDER — METHYLPREDNISOLONE ACETATE 80 MG/ML IJ SUSP
80.0000 mg | Freq: Once | INTRAMUSCULAR | Status: AC
  Administered 2018-12-20: 80 mg via INTRAMUSCULAR

## 2018-12-20 NOTE — Telephone Encounter (Signed)
This is a very unfortunate situation in which patient had relayed to staff of new symptoms of body aches and neck pain and congestion. The staff and I advised that she needs to get COVID testing ASAP. This patient was very nasty to staff and myself and I tried to explain that her symptoms do warrant  further testing.   Patient then proceeded to accuse [place] for not taking care of her. In fact, I asked Margie to facilitate the patients wishes to obtain Depo infection and to protect herself very cautiously when administering the injection.  I feel that this patient has compromised the staff and protocols and has done so ina derogatory manner.  I strongly recommend that this patient be dismissed from our practice immediately.

## 2018-12-20 NOTE — Telephone Encounter (Signed)
Pt presents to office for depo injection that was ordered during 12/19/2018 phone visit.  Brandi proceeded to screen patient for covid. During screening pt reported of some new and old symptoms. She reported congestion and  felt that she would benefit from covid testing.  I then discussed this with Dr. Patsey Berthold, as Dr. Mortimer Fries was not in office yet. Dr. Patsey Berthold suggested that I obtain additional symptoms and recommend that pt get covid tested prior to depo injection. I then went out lobby and accessed pt's symptoms further. Pt reported to me that she has some old and new symptoms and that achy neck, and bodyaches were some of the new symptoms. Pt then stated that she did in fact think she should get covid testing. I reported this information to Dr. Mortimer Fries once he arrived to office, who recommended that pt get covid tested prior to depo injection. I then proceeded to relay this information to pt, who became very upset and demanded depo injection, as Dr. Alva Garnet promised her that she could get a depo at any time for acute symptoms. 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 I explained to pt that we were not turning her away, that we were trying to protect her and ourselves as we are currently in a pandemic and with her symptoms she should get coivd tested. She then begins to raise her voice and stated that the doctor could come tell her himself. Dr. Mortimer Fries went to lobby and explained the above recommendations, and reasoning as to why he suggest covid test first.  At that time pt states that she has chronic body/neck aches and that her congestion has been present for months, and that she just wanted to proactive and have depo prior to covid test. She then proceed to ask Dr. Mortimer Fries if it's just that he doesn't treat disabled women.  Due to pt's having 2 negative covid test previously,  Dr. Mortimer Fries okay'd depo injection. I then proceeded with pt's depo  injection, during injection pt kept noting how she has never been treated this way and that we were overreacting. I then wheeled pt back to medical mall and pt continued expressing her frustration and stated that it's a full moon tonight and that must be why were are acting so strange, and that we owe her an apology. I then again apologize to pt and explained reasoning as to why we suggested covid testing prior to injection. Prior to this interaction with pt, I went upfront to drop outgoing mail off and witnessed pt without her mask on eating with a pt beside her (there was a chair in between patients). I asked pt to put her face mask on and pt did so.

## 2018-12-23 NOTE — Progress Notes (Deleted)
Cardiology Office Note  Date:  12/23/2018   ID:  Angela [name], DOB 06/29/1950, MRN [id number]  PCP:  Patient, No Pcp Per   No chief complaint on file.   HPI:  Patient has a past medical history of COPD Leg edema Bipolar;/personality disorder/Depression/ PTSD seen by psychiatry Former smoker, stopped in her 60s large hiatal hernia Scoliosis, On CT: Minimal aortic athero in the descending aorta No significant coronary calcification, no significant carotid calcification Who presents for f/u of her  chest pain    In follow-up today she reports fullness in her chest, constipation issues Lost appetite, which she attributes to her large hiatal hernia seen on CT scan Chronic chest pain, atypical in nature  Seen in the ER 07/11/2018, muiltiple issues, including chest pain CT angio chest: Large hiatal hernia unchanged. No significant atherosclerosis  Seen in the emergency room February 2020 for shortness of breath And left-sided chest pain Work-up negative  Seen by Dr. Philis Kendall Would likely need to undergo laparoscopic hiatal hernia repair and fundoplication, possible gastropexy.   Prior to procedure was referred to GI in [facility] for EGD colonoscopy and management of her chronic constipation  EKG personally reviewed by myself on todays visit Shows NSR rate 80 bpm, no significant ST or T wave changes  Prior CV studies:   The following studies were reviewed today: Echo 2017 - Left ventricle: The cavity size was normal. Wall thickness was   normal. Systolic function was normal. The estimated ejection   fraction was in the range of 60% to 65%. Wall motion was normal;   there were no regional wall motion abnormalities. Doppler   parameters are consistent with abnormal left ventricular   relaxation (grade 1 diastolic dysfunction). - Mitral valve: There was mild regurgitation. - Right atrium: The right atrium appears to be compressed by an   outside structure likely  the known hiatal hernia.   PMH:   has a past medical history of Allergic rhinitis, Anemia, Blurred vision, Depression, Diverticulosis, Endometriosis, Falls, GERD (gastroesophageal reflux disease), Hernia, hiatal, Hypothyroidism, IBS (irritable bowel syndrome), Malignant neoplasm of skin, Migraine, Neuropathy, PTSD (post-traumatic stress disorder), and Thyroid disease.  PSH:    Past Surgical History:  Procedure Laterality Date  . APPENDECTOMY    . CHOLECYSTECTOMY    . CHOLECYSTECTOMY, LAPAROSCOPIC    . ESOPHAGOGASTRODUODENOSCOPY (EGD) WITH PROPOFOL N/A 03/29/2015   Procedure: ESOPHAGOGASTRODUODENOSCOPY (EGD) WITH PROPOFOL;  Surgeon: Josefine Class, MD;  Location: [facility] ENDOSCOPY;  Service: Endoscopy;  Laterality: N/A;  . HERNIA REPAIR    . laproscopy    . TONSILLECTOMY    . uteral suspension      Current Outpatient Medications  Medication Sig Dispense Refill  . acetaminophen (TYLENOL) 650 MG CR tablet Take 650 mg by mouth every 8 (eight) hours as needed for pain.    . Acetylcysteine (NAC) 500 MG CAPS Take 500 mg by mouth 2 (two) times a day.    . albuterol (VENTOLIN HFA) 108 (90 Base) MCG/ACT inhaler Inhale 2 puffs into the lungs every 4 (four) hours as needed for wheezing or shortness of breath. 18 g 3  . Amino Acids-Protein Hydrolys (FEEDING SUPPLEMENT, PRO-STAT 64,) LIQD Take 30 mLs by mouth daily. 887 mL 0  . atorvastatin (LIPITOR) 20 MG tablet Take 1 tablet (20 mg total) by mouth at bedtime. 90 tablet 0  . azithromycin (ZITHROMAX) 250 MG tablet Take 1 tablet (250 mg total) by mouth every other day. Take every Mon, Wed, Fri 45 each 0  .  Biotin 10000 MCG TABS Take 10,000 mcg by mouth daily.     . Calcium Citrate (CITRACAL PO) Take 800 mg by mouth. Takes 2 (400mg ) tablets BID    . Cholecalciferol 25 MCG (1000 UT) tablet Take 1 tablet (1,000 Units total) by mouth daily. 30 tablet 1  . Cyanocobalamin (VITAMIN B-12) 1000 MCG SUBL Place 1,000 mcg under the tongue.     . diclofenac  sodium (VOLTAREN) 1 % GEL Apply small amount (2 grams) topically over knees or hands up to four times a day if needed for arthritis 100 g 0  . doxycycline (VIBRA-TABS) 100 MG tablet Take 1 tablet (100 mg total) by mouth 2 (two) times daily. 10 tablet 0  . estradiol (ESTRING) 2 MG vaginal ring Place 2 mg vaginally every 3 (three) months. follow package directions 1 each 4  . famotidine (PEPCID) 20 MG tablet Take 1 tablet (20 mg total) by mouth 2 (two) times daily as needed for heartburn or indigestion. 180 tablet 0  . ferrous sulfate 325 (65 FE) MG tablet Take 1 tablet (325 mg total) by mouth daily with breakfast. 90 tablet 0  . fluconazole (DIFLUCAN) 150 MG tablet TAKE 1 TABLET EACH WEEK WHILE TAKING DOXYCYCLINE    . fluticasone (FLONASE) 50 MCG/ACT nasal spray Place 2 sprays into both nostrils daily.     Marland Kitchen gabapentin (NEURONTIN) 100 MG capsule Take 100 mg by mouth 2 (two) times daily.    . hydrOXYzine (ATARAX/VISTARIL) 10 MG tablet TAKE 1 TO 2 TABLETS BY MOUTH ONCE DAILY AS NEEDED    . Icosapent Ethyl (VASCEPA) 1 g CAPS TAKE 2 CAPSULES BY MOUTH TWICE DAILY (TO HELP WITH TRIGLYCERIDES) 180 capsule 0  . Lactulose 20 GM/30ML SOLN 30 ml every 4 hours until constipation is relieved 236 mL 3  . levalbuterol (XOPENEX) 0.63 MG/3ML nebulizer solution Take 3 mLs (0.63 mg total) by nebulization every 4 (four) hours as needed for wheezing or shortness of breath. 300 mL 2  . levothyroxine (SYNTHROID) 100 MCG tablet TAKE 1 TABLET BY MOUTH ONCE DAILY AT  6AM 90 tablet 0  . linaclotide (LINZESS) 290 MCG CAPS capsule Take 1 capsule (290 mcg total) by mouth daily before breakfast. 30 capsule 0  . LORazepam (ATIVAN) 0.5 MG tablet TAKE 1 TABLET BY MOUTH AS NEEDED ONCE DAILY    . losartan (COZAAR) 50 MG tablet Take 1 tablet (50 mg total) by mouth daily. 90 tablet 3  . Magnesium 500 MG TABS Take 500 mg by mouth every other day.    . Multiple Vitamin (MULTIVITAMIN WITH MINERALS) TABS tablet Take 1 tablet by mouth daily.  30 tablet 1  . ondansetron (ZOFRAN ODT) 4 MG disintegrating tablet Allow 1-2 tablets to dissolve in your mouth every 8 hours as needed for nausea/vomiting 30 tablet 0  . PARoxetine (PAXIL) 20 MG tablet Take 20 mg by mouth daily.    . prednisoLONE acetate (PRED FORTE) 1 % ophthalmic suspension INSTILL 1 DROP INTO EACH EYE TWICE DAILY    . pregabalin (LYRICA) 75 MG capsule Take 75 mg by mouth 2 (two) times daily.     Marland Kitchen PREMARIN vaginal cream Place 1 Applicatorful vaginally 2 (two) times a week. 60 g 0  . pyridoxine (B-6) 100 MG tablet Take 1 tablet (100 mg total) by mouth 2 (two) times daily. 60 tablet 0  . tiZANidine (ZANAFLEX) 2 MG tablet Take by mouth every 6 (six) hours as needed for muscle spasms.    . traZODone (DESYREL) 50 MG  tablet Take 1 tablet (50 mg total) by mouth at bedtime as needed. Pt takes 1/2 tablet qhs 90 tablet 0  . triamcinolone ointment (KENALOG) 0.5 % Apply 1 application topically 2 (two) times daily. 30 g 2  . umeclidinium bromide (INCRUSE ELLIPTA) 62.5 MCG/INH AEPB Inhale 1 puff into the lungs daily. 60 each 5  . XIIDRA 5 % SOLN INSTILL 1 DROP INTO EACH EYE TWICE DAILY     No current facility-administered medications for this visit.      Allergies:   Advair diskus [fluticasone-salmeterol], Bee venom, Darvon [propoxyphene], Dicyclomine, Other, Oxycodone-acetaminophen, Peanuts [peanut oil], Penicillins, Sulfa antibiotics, Sulfacetamide sodium, Ultram [tramadol], Amikacin, Aspirin, Haloperidol, Hymenoptera venom preparations, Imipramine, Keflex [cephalexin], Lactose, Lactose intolerance (gi), Risperdal [risperidone], Sulfur, Adhesive [tape], Albuterol, Ciprofloxacin, Hydroxyzine, Peanut butter flavor, and Prednisone   Social History:  The patient  reports that she quit smoking about 42 years ago. She has never used smokeless tobacco. She reports current alcohol use. She reports that she does not use drugs.   Family History:   family history includes Anxiety disorder in her  brother and brother; Arthritis in her brother, brother, and sister; COPD in her father; Cancer in her mother; Depression in her father and sister; Hearing loss in her father; Heart disease in her father; Hernia in her brother and brother; Stroke in her father; Varicose Veins in her father; Vision loss in her father.    Review of Systems: Review of Systems  Constitutional: Negative.   HENT: Negative.   Respiratory: Negative.   Cardiovascular: Positive for chest pain.  Gastrointestinal: Positive for constipation.  Musculoskeletal: Negative.   Neurological: Negative.   Psychiatric/Behavioral: Negative.   All other systems reviewed and are negative.   PHYSICAL EXAM: VS:  There were no vitals taken for this visit. , BMI There is no height or weight on file to calculate BMI. GEN: Well nourished, well developed, in no acute distress HEENT: normal Neck: no JVD, carotid bruits, or masses Cardiac: RRR; no murmurs, rubs, or gallops,no edema  Respiratory:  clear to auscultation bilaterally, normal work of breathing GI: soft, nontender, nondistended, + BS MS: no deformity or atrophy Skin: warm and dry, no rash Neuro:  Strength and sensation are intact Psych: euthymic mood, full affect   Recent Labs: 06/08/2018: B Natriuretic Peptide 55.0 10/22/2018: ALT 28; Magnesium 2.3; TSH 1.39 12/04/2018: BUN 19; Creatinine, Ser 0.91; Hemoglobin 12.8; Platelets 308; Potassium 4.1; Sodium 133    Lipid Panel Lab Results  Component Value Date   CHOL 171 02/26/2018   HDL 60 02/26/2018   LDLCALC 92 02/26/2018   TRIG 98 02/26/2018      Wt Readings from Last 3 Encounters:  12/04/18 141 lb 5 oz (64.1 kg)  11/26/18 141 lb 4.8 oz (64.1 kg)  10/29/18 141 lb 4.8 oz (64.1 kg)       ASSESSMENT AND PLAN:  Problem List Items Addressed This Visit    None     Chest pain Atypical, no further ischemic work-up needed Pressure suspected from hiatal hernia CT reviewed, large hernia, Minimal if any  aortic athero  Hiatal hernia Will call GI in [facility], see if we can move up EGD, colo Also needs management of chronic constipation Then back to [facility] for surgery for consideration of surgery  HTN Blood pressure is well controlled on today's visit. No changes made to the medications.  Hyperlipidemia On statin  Disposition:   F/U  12 months   Total encounter time more than 25 minutes  Greater than 50% was spent in counseling and coordination of care with the patient    Signed, Esmond Plants, M.D., Ph.D. [place], [place]

## 2018-12-23 NOTE — Telephone Encounter (Signed)
Dismissal letter has been printed, signed by Dr. Mortimer Fries and placed in outgoing mail.

## 2018-12-24 ENCOUNTER — Other Ambulatory Visit: Payer: Self-pay

## 2018-12-24 ENCOUNTER — Telehealth: Payer: Medicare Other | Admitting: Cardiovascular Disease

## 2018-12-24 ENCOUNTER — Ambulatory Visit: Payer: Medicare Other | Admitting: Podiatry

## 2018-12-25 ENCOUNTER — Other Ambulatory Visit: Payer: Self-pay

## 2018-12-25 ENCOUNTER — Observation Stay
Admission: EM | Admit: 2018-12-25 | Discharge: 2018-12-27 | Disposition: A | Discharge status: Home / Self Care | Payer: Medicare Other | Attending: Internal Medicine | Admitting: Internal Medicine

## 2018-12-25 ENCOUNTER — Encounter: Payer: Self-pay | Admitting: Emergency Medicine

## 2018-12-25 ENCOUNTER — Telehealth: Payer: Self-pay

## 2018-12-25 ENCOUNTER — Emergency Department: Payer: Medicare Other

## 2018-12-25 DIAGNOSIS — Z79899 Other long term (current) drug therapy: Secondary | ICD-10-CM | POA: Insufficient documentation

## 2018-12-25 DIAGNOSIS — Z20828 Contact with and (suspected) exposure to other viral communicable diseases: Secondary | ICD-10-CM | POA: Diagnosis not present

## 2018-12-25 DIAGNOSIS — M6281 Muscle weakness (generalized): Secondary | ICD-10-CM | POA: Insufficient documentation

## 2018-12-25 DIAGNOSIS — Z7951 Long term (current) use of inhaled steroids: Secondary | ICD-10-CM | POA: Insufficient documentation

## 2018-12-25 DIAGNOSIS — K449 Diaphragmatic hernia without obstruction or gangrene: Secondary | ICD-10-CM | POA: Insufficient documentation

## 2018-12-25 DIAGNOSIS — K219 Gastro-esophageal reflux disease without esophagitis: Secondary | ICD-10-CM | POA: Insufficient documentation

## 2018-12-25 DIAGNOSIS — F4310 Post-traumatic stress disorder, unspecified: Secondary | ICD-10-CM | POA: Insufficient documentation

## 2018-12-25 DIAGNOSIS — K589 Irritable bowel syndrome without diarrhea: Secondary | ICD-10-CM | POA: Insufficient documentation

## 2018-12-25 DIAGNOSIS — I071 Rheumatic tricuspid insufficiency: Secondary | ICD-10-CM | POA: Diagnosis not present

## 2018-12-25 DIAGNOSIS — Z79890 Hormone replacement therapy (postmenopausal): Secondary | ICD-10-CM | POA: Diagnosis not present

## 2018-12-25 DIAGNOSIS — Z87891 Personal history of nicotine dependence: Secondary | ICD-10-CM | POA: Diagnosis not present

## 2018-12-25 DIAGNOSIS — E039 Hypothyroidism, unspecified: Secondary | ICD-10-CM | POA: Insufficient documentation

## 2018-12-25 DIAGNOSIS — G3184 Mild cognitive impairment, so stated: Secondary | ICD-10-CM | POA: Diagnosis present

## 2018-12-25 DIAGNOSIS — Z8673 Personal history of transient ischemic attack (TIA), and cerebral infarction without residual deficits: Secondary | ICD-10-CM

## 2018-12-25 DIAGNOSIS — F4489 Other dissociative and conversion disorders: Secondary | ICD-10-CM

## 2018-12-25 DIAGNOSIS — R4020 Unspecified coma: Secondary | ICD-10-CM | POA: Diagnosis not present

## 2018-12-25 DIAGNOSIS — I700 Atherosclerosis of aorta: Secondary | ICD-10-CM | POA: Diagnosis not present

## 2018-12-25 DIAGNOSIS — R946 Abnormal results of thyroid function studies: Secondary | ICD-10-CM | POA: Insufficient documentation

## 2018-12-25 DIAGNOSIS — R4182 Altered mental status, unspecified: Secondary | ICD-10-CM | POA: Diagnosis not present

## 2018-12-25 DIAGNOSIS — R410 Disorientation, unspecified: Secondary | ICD-10-CM

## 2018-12-25 DIAGNOSIS — R9431 Abnormal electrocardiogram [ECG] [EKG]: Secondary | ICD-10-CM | POA: Diagnosis not present

## 2018-12-25 DIAGNOSIS — H9011 Conductive hearing loss, unilateral, right ear, with unrestricted hearing on the contralateral side: Secondary | ICD-10-CM | POA: Insufficient documentation

## 2018-12-25 DIAGNOSIS — G459 Transient cerebral ischemic attack, unspecified: Secondary | ICD-10-CM | POA: Diagnosis present

## 2018-12-25 DIAGNOSIS — R7989 Other specified abnormal findings of blood chemistry: Secondary | ICD-10-CM

## 2018-12-25 DIAGNOSIS — F3189 Other bipolar disorder: Secondary | ICD-10-CM | POA: Insufficient documentation

## 2018-12-25 DIAGNOSIS — R2681 Unsteadiness on feet: Secondary | ICD-10-CM | POA: Insufficient documentation

## 2018-12-25 DIAGNOSIS — Z85828 Personal history of other malignant neoplasm of skin: Secondary | ICD-10-CM | POA: Insufficient documentation

## 2018-12-25 DIAGNOSIS — I10 Essential (primary) hypertension: Secondary | ICD-10-CM | POA: Diagnosis not present

## 2018-12-25 DIAGNOSIS — Z03818 Encounter for observation for suspected exposure to other biological agents ruled out: Secondary | ICD-10-CM | POA: Diagnosis not present

## 2018-12-25 DIAGNOSIS — Z9181 History of falling: Secondary | ICD-10-CM

## 2018-12-25 DIAGNOSIS — G43909 Migraine, unspecified, not intractable, without status migrainosus: Secondary | ICD-10-CM | POA: Insufficient documentation

## 2018-12-25 HISTORY — DX: Altered mental status, unspecified: R41.82

## 2018-12-25 HISTORY — DX: Other dissociative and conversion disorders: F44.89

## 2018-12-25 LAB — CBC
HCT: 37.4 % (ref 36.0–46.0)
Hemoglobin: 12.9 g/dL (ref 12.0–15.0)
MCH: 30.8 pg (ref 26.0–34.0)
MCHC: 34.5 g/dL (ref 30.0–36.0)
MCV: 89.3 fL (ref 80.0–100.0)
Platelets: 276 10*3/uL (ref 150–400)
RBC: 4.19 MIL/uL (ref 3.87–5.11)
RDW: 14.1 % (ref 11.5–15.5)
WBC: 5.9 10*3/uL (ref 4.0–10.5)
nRBC: 0 % (ref 0.0–0.2)

## 2018-12-25 LAB — COMPREHENSIVE METABOLIC PANEL
ALT: 62 U/L — ABNORMAL HIGH (ref 0–44)
AST: 55 U/L — ABNORMAL HIGH (ref 15–41)
Albumin: 4.3 g/dL (ref 3.5–5.0)
Alkaline Phosphatase: 98 U/L (ref 38–126)
Anion gap: 9 (ref 5–15)
BUN: 15 mg/dL (ref 8–23)
CO2: 28 mmol/L (ref 22–32)
Calcium: 9.2 mg/dL (ref 8.9–10.3)
Chloride: 102 mmol/L (ref 98–111)
Creatinine, Ser: 0.72 mg/dL (ref 0.44–1.00)
GFR calc Af Amer: >60 mL/min (ref >60)
GFR calc non Af Amer: >60 mL/min (ref >60)
Glucose, Bld: 97 mg/dL (ref 70–99)
Potassium: 4 mmol/L (ref 3.5–5.1)
Sodium: 139 mmol/L (ref 135–145)
Total Bilirubin: 0.4 mg/dL (ref 0.3–1.2)
Total Protein: 7.4 g/dL (ref 6.5–8.1)

## 2018-12-25 LAB — TROPONIN I (HIGH SENSITIVITY): Troponin I (High Sensitivity): 3 ng/L (ref <18)

## 2018-12-25 LAB — URINALYSIS, COMPLETE (UACMP) WITH MICROSCOPIC
Bacteria, UA: NONE SEEN
Bilirubin Urine: NEGATIVE
Glucose, UA: NEGATIVE mg/dL
Hgb urine dipstick: NEGATIVE
Ketones, ur: NEGATIVE mg/dL
Leukocytes,Ua: NEGATIVE
Nitrite: NEGATIVE
Protein, ur: NEGATIVE mg/dL
Specific Gravity, Urine: 1.005 (ref 1.005–1.030)
pH: 8 (ref 5.0–8.0)

## 2018-12-25 LAB — URINE DRUG SCREEN, QUALITATIVE (ARMC ONLY)
Amphetamines, Ur Screen: NOT DETECTED
Barbiturates, Ur Screen: NOT DETECTED
Benzodiazepine, Ur Scrn: NOT DETECTED
Cannabinoid 50 Ng, Ur ~~LOC~~: NOT DETECTED
Cocaine Metabolite,Ur ~~LOC~~: NOT DETECTED
MDMA (Ecstasy)Ur Screen: NOT DETECTED
Methadone Scn, Ur: NOT DETECTED
Opiate, Ur Screen: NOT DETECTED
Phencyclidine (PCP) Ur S: NOT DETECTED
Tricyclic, Ur Screen: NOT DETECTED

## 2018-12-25 LAB — T4, FREE: Free T4: 1.15 ng/dL — ABNORMAL HIGH (ref 0.61–1.12)

## 2018-12-25 LAB — TSH: TSH: 1.806 u[IU]/mL (ref 0.350–4.500)

## 2018-12-25 MED ORDER — ENOXAPARIN SODIUM 40 MG/0.4ML ~~LOC~~ SOLN
40.0000 mg | SUBCUTANEOUS | Status: DC
  Administered 2018-12-25: 40 mg via SUBCUTANEOUS
  Filled 2018-12-25 (×2): qty 0.4

## 2018-12-25 MED ORDER — ATORVASTATIN CALCIUM 20 MG PO TABS
20.0000 mg | ORAL_TABLET | Freq: Every day | ORAL | Status: DC
  Administered 2018-12-25 – 2018-12-26 (×2): 20 mg via ORAL
  Filled 2018-12-25 (×2): qty 1

## 2018-12-25 MED ORDER — LEVOTHYROXINE SODIUM 50 MCG PO TABS: 50.0000 ug | ORAL_TABLET | Freq: Every day | ORAL | Status: DC

## 2018-12-25 MED ORDER — LORAZEPAM 0.5 MG PO TABS
0.5000 mg | ORAL_TABLET | Freq: Every day | ORAL | Status: DC | PRN
  Administered 2018-12-26: 0.5 mg via ORAL
  Filled 2018-12-25 (×2): qty 1

## 2018-12-25 MED ORDER — PAROXETINE HCL 20 MG PO TABS
20.0000 mg | ORAL_TABLET | Freq: Every day | ORAL | Status: DC
  Administered 2018-12-26 – 2018-12-27 (×2): 20 mg via ORAL
  Filled 2018-12-25 (×4): qty 1

## 2018-12-25 MED ORDER — ONDANSETRON 4 MG PO TBDP
4.0000 mg | ORAL_TABLET | Freq: Three times a day (TID) | ORAL | Status: DC | PRN
  Administered 2018-12-26: 4 mg via ORAL
  Filled 2018-12-25 (×2): qty 1

## 2018-12-25 MED ORDER — STROKE: EARLY STAGES OF RECOVERY BOOK
Freq: Once | Status: AC
  Administered 2018-12-26: 03:00:00

## 2018-12-25 MED ORDER — LOSARTAN POTASSIUM 50 MG PO TABS
50.0000 mg | ORAL_TABLET | Freq: Once | ORAL | Status: DC
  Filled 2018-12-25: qty 1

## 2018-12-25 MED ORDER — FERROUS SULFATE 325 (65 FE) MG PO TABS
325.0000 mg | ORAL_TABLET | Freq: Every day | ORAL | Status: DC
  Administered 2018-12-26 – 2018-12-27 (×2): 325 mg via ORAL
  Filled 2018-12-25 (×2): qty 1

## 2018-12-25 MED ORDER — LINACLOTIDE 290 MCG PO CAPS
290.0000 ug | ORAL_CAPSULE | Freq: Every day | ORAL | Status: DC
  Administered 2018-12-26 – 2018-12-27 (×2): 290 ug via ORAL
  Filled 2018-12-25 (×2): qty 1

## 2018-12-25 MED ORDER — TRAZODONE HCL 50 MG PO TABS: 50.0000 mg | ORAL_TABLET | Freq: Every evening | ORAL | Status: DC | PRN

## 2018-12-25 MED ORDER — ACETAMINOPHEN 325 MG PO TABS
650.0000 mg | ORAL_TABLET | Freq: Three times a day (TID) | ORAL | Status: DC | PRN
  Filled 2018-12-25: qty 2

## 2018-12-25 MED ORDER — SENNOSIDES-DOCUSATE SODIUM 8.6-50 MG PO TABS: 1.0000 | ORAL_TABLET | Freq: Every evening | ORAL | Status: DC | PRN

## 2018-12-25 MED ORDER — SODIUM CHLORIDE 0.9% FLUSH
3.0000 mL | Freq: Two times a day (BID) | INTRAVENOUS | Status: DC
  Administered 2018-12-26: 3 mL via INTRAVENOUS

## 2018-12-25 MED ORDER — VITAMIN D 25 MCG (1000 UNIT) PO TABS
1000.0000 [IU] | ORAL_TABLET | Freq: Every day | ORAL | Status: DC
  Administered 2018-12-26 – 2018-12-27 (×2): 1000 [IU] via ORAL
  Filled 2018-12-25 (×2): qty 1

## 2018-12-25 MED ORDER — LOSARTAN POTASSIUM 50 MG PO TABS
50.0000 mg | ORAL_TABLET | Freq: Every day | ORAL | Status: DC
  Administered 2018-12-26 – 2018-12-27 (×2): 50 mg via ORAL
  Filled 2018-12-25 (×2): qty 1

## 2018-12-25 MED ORDER — FAMOTIDINE 20 MG PO TABS
20.0000 mg | ORAL_TABLET | Freq: Two times a day (BID) | ORAL | Status: DC | PRN
  Administered 2018-12-26 – 2018-12-27 (×3): 20 mg via ORAL
  Filled 2018-12-25 (×4): qty 1

## 2018-12-25 MED ORDER — SODIUM CHLORIDE 0.9 % IV SOLN: 250.0000 mL | INTRAVENOUS | Status: DC | PRN

## 2018-12-25 MED ORDER — LEVALBUTEROL HCL 0.63 MG/3ML IN NEBU
0.6300 mg | INHALATION_SOLUTION | RESPIRATORY_TRACT | Status: DC | PRN
  Administered 2018-12-26: 0.63 mg via RESPIRATORY_TRACT
  Filled 2018-12-25 (×2): qty 3

## 2018-12-25 MED ORDER — UMECLIDINIUM BROMIDE 62.5 MCG/INH IN AEPB
1.0000 | INHALATION_SPRAY | Freq: Every day | RESPIRATORY_TRACT | Status: DC
  Administered 2018-12-26 – 2018-12-27 (×2): 1 via RESPIRATORY_TRACT
  Filled 2018-12-25 (×2): qty 7

## 2018-12-25 MED ORDER — ICOSAPENT ETHYL 1 G PO CAPS: 1.0000 g | ORAL_CAPSULE | Freq: Two times a day (BID) | ORAL | Status: DC

## 2018-12-25 MED ORDER — SODIUM CHLORIDE 0.9% FLUSH: 3.0000 mL | INTRAVENOUS | Status: DC | PRN

## 2018-12-25 NOTE — ED Provider Notes (Signed)
[facility] Emergency Department Provider Note   ____________________________________________   First MD Initiated Contact with Patient 12/25/18 1739     (approximate)  I have reviewed the triage vital signs and the nursing notes.   HISTORY  Chief Complaint Altered Mental Status     HPI Angela [name] is a 68 y.o. female with past medical history of depression, hypothyroidism presents to the ED for altered mental status.  History is limited as patient is unsure how she got to be in the ED and cannot tell me what has been going on with her lately.  Her friend at the bedside, Bartolo Darter, states that she went to visit the patient earlier this morning and bring her some Gatorade and bananas.  She seemed to be doing well at that time and at her usual mental status with no complaints.  Patient then called her friend later and seemed very confused, asking "what is all this on the floor" and not remembering that her friend had visited earlier in the day.  Friend states that patient continues to seem confused here in the ED and she does not usually have issues with her memory or orientation.  Patient currently denies any fevers, chills, cough, chest pain, shortness of breath, dysuria, or hematuria.  She denies any recent changes in her medications.  She also denies any alcohol or drug abuse.        Past Medical History:  Diagnosis Date  . Allergic rhinitis   . Anemia   . Blurred vision   . Depression   . Diverticulosis   . Endometriosis   . Falls   . GERD (gastroesophageal reflux disease)   . Hernia, hiatal   . Hypothyroidism   . IBS (irritable bowel syndrome)   . Malignant neoplasm of skin   . Migraine   . Neuropathy   . PTSD (post-traumatic stress disorder)   . Thyroid disease     Patient Active Problem List   Diagnosis Date Noted  . Altered mental status, unspecified 12/25/2018  . Confusion state 12/25/2018  . Abnormal TSH 12/25/2018  .  Acquired claw toe of both feet 10/23/2018  . Dermatitis 10/22/2018  . Cough productive of yellow sputum 08/08/2018  . Malnutrition of mild degree ([place]) 08/08/2018  . Mixed stress and urge urinary incontinence 08/08/2018  . Magnesium deficiency 08/08/2018  . Aortic atherosclerosis ([place]) 05/31/2018  . Chest pain of uncertain etiology [id number]  . Achilles tendon disorder 04/25/2018  . Insomnia 04/25/2018  . Diastolic dysfunction [id number]  . Mitral regurgitation 03/29/2018  . Bronchospasm 03/15/2018  . Axis II diagnosis deferred 02/20/2018  . Preventative health care 02/08/2018  . Polypharmacy 02/01/2018  . Personality disorder ([place]) 12/25/2017  . Noncompliance 12/25/2017  . Medication monitoring encounter 12/07/2017  . Family history of high cholesterol 12/07/2017  . Arthritis of hand, degenerative 12/07/2017  . Bipolar I disorder, most recent episode mixed, severe with psychotic features ([place]) 09/18/2017  . Agitation 09/18/2017  . Depressed mood 07/11/2017  . Hallux valgus, acquired 07/05/2017  . Hammer toes of both feet 07/05/2017  . Unstable ankle 07/05/2017  . Chronic bilateral low back pain without sciatica 06/09/2017  . Tinea unguium 05/08/2017  . Conductive hearing loss of right ear with unrestricted hearing of left ear 04/03/2017  . Adjustment disorder with depressed mood 01/18/2017  . Migraine 01/18/2017  . IBS (irritable bowel syndrome) 11/17/2016  . Bilateral carpal tunnel syndrome 11/17/2016  . Plantar fasciitis, bilateral 11/17/2016  . Cervical myofascial pain  syndrome 11/17/2016  . Chronic hyponatremia 11/17/2016  . PTSD (post-traumatic stress disorder) 11/17/2016  . Hiatal hernia 11/17/2016  . Vitamin D deficiency 11/17/2016  . Vitamin B12 deficiency 11/17/2016  . SIADH (syndrome of inappropriate ADH production) ([place]) 09/08/2016  . Cervical spondylosis with myelopathy 05/24/2016  . MCI (mild cognitive impairment) with memory loss 03/30/2016  . Gait  abnormality 03/30/2016  . Chronic bipolar affective disorder ([place]) 01/24/2016  . Recurrent major depressive disorder, in partial remission ([place]) 01/24/2016  . Anemia, iron deficiency 06/24/2015  . Benign essential HTN 06/24/2015  . History of falling 05/22/2015  . History of TIA (transient ischemic attack) 04/20/2015  . Hypothyroidism 04/20/2015  . GERD (gastroesophageal reflux disease) 04/20/2015  . Cervical disc disorder at C5-C6 level with radiculopathy 03/25/2015  . Chronic neck pain 03/25/2015  . Pelvic pain in female 10/30/2014  . History of skin cancer 06/30/2014  . Chronic sinusitis 06/30/2014  . Chronic headaches 12/16/2013  . Neuropathy 12/16/2013    Past Surgical History:  Procedure Laterality Date  . APPENDECTOMY    . CHOLECYSTECTOMY    . CHOLECYSTECTOMY, LAPAROSCOPIC    . ESOPHAGOGASTRODUODENOSCOPY (EGD) WITH PROPOFOL N/A 03/29/2015   Procedure: ESOPHAGOGASTRODUODENOSCOPY (EGD) WITH PROPOFOL;  Surgeon: Josefine Class, MD;  [facility] END[facility];  Service: Endoscopy;  Laterality: N/A;  . HERNIA REPAIR    . laproscopy    . TONSILLECTOMY    . uteral suspension      Prior to Admission medications   Medication Sig Start Date End Date Taking? Authorizing Provider  acetaminophen (TYLENOL) 650 MG CR tablet Take 650 mg by mouth every 8 (eight) hours as needed for pain.    [provider]  Acetylcysteine (NAC) 500 MG CAPS Take 500 mg by mouth 2 (two) times a day.    [provider]  albuterol (VENTOLIN HFA) 108 (90 Base) MCG/ACT inhaler Inhale 2 puffs into the lungs every 4 (four) hours as needed for wheezing or shortness of breath. 11/27/18   Crecencio Mc, MD  Amino Acids-Protein Hydrolys (FEEDING SUPPLEMENT, PRO-STAT 64,) LIQD Take 30 mLs by mouth daily. 04/12/18   Poulose, Bethel Born, NP  atorvastatin (LIPITOR) 20 MG tablet Take 1 tablet (20 mg total) by mouth at bedtime. 11/26/18   Crecencio Mc, MD  azithromycin (ZITHROMAX) 250 MG tablet  Take 1 tablet (250 mg total) by mouth every other day. Take every Mon, Wed, Fri 11/26/18   Crecencio Mc, MD  Biotin 10000 MCG TABS Take 10,000 mcg by mouth daily.     [provider]  Calcium Citrate (CITRACAL PO) Take 800 mg by mouth. Takes 2 (400mg ) tablets BID    [provider]  Cholecalciferol 25 MCG (1000 UT) tablet Take 1 tablet (1,000 Units total) by mouth daily. 12/26/17   Clapacs, Madie Reno, MD  Cyanocobalamin (VITAMIN B-12) 1000 MCG SUBL Place 1,000 mcg under the tongue.     [provider]  diclofenac sodium (VOLTAREN) 1 % GEL Apply small amount (2 grams) topically over knees or hands up to four times a day if needed for arthritis 11/26/18   Crecencio Mc, MD  doxycycline (VIBRA-TABS) 100 MG tablet Take 1 tablet (100 mg total) by mouth 2 (two) times daily. 12/02/18   Flora Lipps, MD  estradiol (ESTRING) 2 MG vaginal ring Place 2 mg vaginally every 3 (three) months. follow package directions 10/29/18   Rubie Maid, MD  famotidine (PEPCID) 20 MG tablet Take 1 tablet (20 mg total) by mouth 2 (two)  times daily as needed for heartburn or indigestion. 11/26/18   Crecencio Mc, MD  ferrous sulfate 325 (65 FE) MG tablet Take 1 tablet (325 mg total) by mouth daily with breakfast. 11/26/18   Crecencio Mc, MD  fluconazole (DIFLUCAN) 150 MG tablet TAKE 1 TABLET EACH WEEK WHILE TAKING DOXYCYCLINE 07/27/18   [provider]  fluticasone (FLONASE) 50 MCG/ACT nasal spray Place 2 sprays into both nostrils daily.     [provider]  gabapentin (NEURONTIN) 100 MG capsule Take 100 mg by mouth 2 (two) times daily.    [provider]  hydrOXYzine (ATARAX/VISTARIL) 10 MG tablet TAKE 1 TO 2 TABLETS BY MOUTH ONCE DAILY AS NEEDED 02/05/18   [provider]  Icosapent Ethyl (VASCEPA) 1 g CAPS TAKE 2 CAPSULES BY MOUTH TWICE DAILY (TO HELP WITH TRIGLYCERIDES) 11/26/18   Crecencio Mc, MD  Lactulose 20 GM/30ML SOLN 30 ml every 4 hours until  constipation is relieved 11/08/18   Crecencio Mc, MD  levalbuterol (XOPENEX) 0.63 MG/3ML nebulizer solution Take 3 mLs (0.63 mg total) by nebulization every 4 (four) hours as needed for wheezing or shortness of breath. 11/26/18   Crecencio Mc, MD  levothyroxine (SYNTHROID) 100 MCG tablet TAKE 1 TABLET BY MOUTH ONCE DAILY AT  6AM 11/26/18   Crecencio Mc, MD  linaclotide [facility]) 290 MCG CAPS capsule Take 1 capsule (290 mcg total) by mouth daily before breakfast. 12/26/17   Clapacs, Madie Reno, MD  LORazepam (ATIVAN) 0.5 MG tablet TAKE 1 TABLET BY MOUTH AS NEEDED ONCE DAILY 02/06/18   [provider]  losartan (COZAAR) 50 MG tablet Take 1 tablet (50 mg total) by mouth daily. 11/26/18 02/24/19  Crecencio Mc, MD  Magnesium 500 MG TABS Take 500 mg by mouth every other day.    [provider]  Multiple Vitamin (MULTIVITAMIN WITH MINERALS) TABS tablet Take 1 tablet by mouth daily. 12/26/17   Clapacs, Madie Reno, MD  ondansetron (ZOFRAN ODT) 4 MG disintegrating tablet Allow 1-2 tablets to dissolve in your mouth every 8 hours as needed for nausea/vomiting 12/04/18   Hinda Kehr, MD  PARoxetine (PAXIL) 20 MG tablet Take 20 mg by mouth daily.    [provider]  prednisoLONE acetate (PRED FORTE) 1 % ophthalmic suspension INSTILL 1 DROP INTO EACH EYE TWICE DAILY 07/26/18   [provider]  pregabalin (LYRICA) 75 MG capsule Take 75 mg by mouth 2 (two) times daily.     [provider]  PREMARIN vaginal cream Place 1 Applicatorful vaginally 2 (two) times a week. 11/28/18   Crecencio Mc, MD  pyridoxine (B-6) 100 MG tablet Take 1 tablet (100 mg total) by mouth 2 (two) times daily. 12/26/17   Clapacs, Madie Reno, MD  tiZANidine (ZANAFLEX) 2 MG tablet Take by mouth every 6 (six) hours as needed for muscle spasms.    [provider]  traZODone (DESYREL) 50 MG tablet Take 1 tablet (50 mg total) by mouth at bedtime as needed. Pt takes 1/2 tablet qhs 11/26/18   Crecencio Mc, MD  triamcinolone ointment (KENALOG) 0.5 % Apply 1 application topically 2 (two) times daily. 11/26/18   Crecencio Mc, MD  umeclidinium bromide (INCRUSE ELLIPTA) 62.5 MCG/INH AEPB Inhale 1 puff into the lungs daily. 11/26/18   Crecencio Mc, MD  XIIDRA 5 % SOLN INSTILL 1 DROP INTO EACH EYE TWICE DAILY 07/27/18   [provider]    Allergies Advair diskus [fluticasone-salmeterol], Bee  venom, Darvon [propoxyphene], Dicyclomine, Other, Oxycodone-acetaminophen, Peanuts [peanut oil], Penicillins, Sulfa antibiotics, Sulfacetamide sodium, Ultram [tramadol], Amikacin, Aspirin, Haloperidol, Hymenoptera venom preparations, Imipramine, Keflex [cephalexin], Lactose, Lactose intolerance (gi), Risperdal [risperidone], Sulfur, Adhesive [tape], Albuterol, Ciprofloxacin, Hydroxyzine, Peanut butter flavor, and Prednisone  Family History  Problem Relation Age of Onset  . Heart disease Father   . Hearing loss Father   . COPD Father   . Depression Father   . Stroke Father   . Vision loss Father   . Varicose Veins Father   . Cancer Mother        lung  . Depression Sister   . Arthritis Sister   . Arthritis Brother   . Hernia Brother   . Anxiety disorder Brother   . Arthritis Brother   . Hernia Brother   . Anxiety disorder Brother   . Urolithiasis Neg Hx   . Kidney disease Neg Hx   . Kidney cancer Neg Hx   . Prostate cancer Neg Hx     Social History Social History   Tobacco Use  . Smoking status: Former Smoker    Quit date: 11/17/1976    Years since quitting: 42.1  . Smokeless tobacco: Never Used  Substance Use Topics  . Alcohol use: Yes    Comment: occ  . Drug use: No    Review of Systems  Constitutional: No fever/chills Eyes: No visual changes. ENT: No sore throat. Cardiovascular: Denies chest pain. Respiratory: Denies shortness of breath. Gastrointestinal: No abdominal pain.  No nausea, no vomiting.  No diarrhea.  No constipation. Genitourinary: Negative for  dysuria. Musculoskeletal: Negative for back pain. Skin: Negative for rash. Neurological: Negative for headaches, focal weakness or numbness.  Positive for confusion.  ____________________________________________   PHYSICAL EXAM:  VITAL SIGNS: ED Triage Vitals  Enc Vitals Group     BP 12/25/18 1621 (!) 178/95     Pulse Rate 12/25/18 1621 60     Resp 12/25/18 1621 18     Temp 12/25/18 1621 99 F (37.2 C)     Temp Source 12/25/18 1621 Oral     SpO2 12/25/18 1621 98 %     Weight 12/25/18 1622 140 lb (63.5 kg)     Height 12/25/18 1622 5\' 3"  (1.6 m)     Head Circumference --      Peak Flow --      Pain Score 12/25/18 1634 0     Pain Loc --      Pain Edu? --      Excl. in [place]? --     Constitutional: Alert and oriented to person and place but not time. Eyes: Conjunctivae are normal. Head: Atraumatic. Nose: No congestion/rhinnorhea. Mouth/Throat: Mucous membranes are moist. Neck: Normal ROM Cardiovascular: Normal rate, regular rhythm. Grossly normal heart sounds. Respiratory: Normal respiratory effort.  No retractions. Lungs CTAB. Gastrointestinal: Soft and nontender. No distention. Genitourinary: deferred Musculoskeletal: No lower extremity tenderness nor edema. Neurologic:  Normal speech and language. No gross focal neurologic deficits are appreciated. Skin:  Skin is warm, dry and intact. No rash noted. Psychiatric: Mood and affect are normal. Speech and behavior are normal.  ____________________________________________   LABS (all labs ordered are listed, but only abnormal results are displayed)  Labs Reviewed  COMPREHENSIVE METABOLIC PANEL - Abnormal; Notable for the following components:      Result Value   AST 55 (*)    ALT 62 (*)    All other components within normal limits  URINALYSIS, COMPLETE (UACMP) WITH MICROSCOPIC -  Abnormal; Notable for the following components:   Color, Urine COLORLESS (*)    APPearance CLEAR (*)    All other components within normal  limits  T4, FREE - Abnormal; Notable for the following components:   Free T4 1.15 (*)    All other components within normal limits  SARS CORONAVIRUS 2 (TAT 6-24 HRS)  CBC  URINE DRUG SCREEN, QUALITATIVE ([facility] ONLY)  TSH  HIV ANTIBODY (ROUTINE TESTING W REFLEX)  BASIC METABOLIC PANEL  CBC  HEMOGLOBIN A1C  LIPID PANEL  CBG MONITORING, ED  TROPONIN I (HIGH SENSITIVITY)   ____________________________________________  EKG  ED ECG REPORT I, Blake Divine, the attending physician, personally viewed and interpreted this ECG.   Date: 12/25/2018  EKG Time: 16:45  Rate: 62  Rhythm: normal sinus rhythm  Axis: Normal  Intervals:none  ST&T Change: None   PROCEDURES  Procedure(s) performed (including Critical Care):  Procedures   ____________________________________________   INITIAL IMPRESSION / ASSESSMENT AND PLAN / ED COURSE       68 year old female with history of depression and hypothyroidism presents to the ED complaining of altered mental status.  She was reportedly at her baseline mental status earlier today when her friend visited, but had an acute change later with difficulty remembering recent events and being disoriented to time.  She does not have any focal neurologic deficits on my exam and I would have a low suspicion for stroke.  Will screen CT head, labs thus far are unremarkable.  Will also check thyroid function given her history.  While there may be a psychiatric component to her current presentation, medical admission may be beneficial for further evaluation.  CT head negative for acute process and remainder of labs are unremarkable.  Patient continues to be confused and does not remember me when I return to her bedside for reevaluation.  Friend continues to state this is an acute change for the patient and as such we will admit for further management.  Case discussed with hospitalist, who accepts patient for admission.       ____________________________________________   FINAL CLINICAL IMPRESSION(S) / ED DIAGNOSES  Final diagnoses:  Altered mental status, unspecified altered mental status type  Confusion     ED Discharge Orders    None       Note:  This document was prepared using Dragon voice recognition software and may include unintentional dictation errors.   Blake Divine, MD 12/26/18 [id number]

## 2018-12-25 NOTE — ED Notes (Signed)
Pt friend at bedside 

## 2018-12-25 NOTE — Telephone Encounter (Signed)
Copied from [place] [id number]. Topic: General - Other >> Dec 25, 2018  2:33 PM Leward Quan A wrote: Reason for CRM: Patient called very confused on the reason why Dr Derrel Nip will no longer be her PCP. She is asking for a call back at Ph# [phone]

## 2018-12-25 NOTE — ED Notes (Signed)
PT from [facility] , with complaints of memory loss/confusion , support person with pt

## 2018-12-25 NOTE — ED Triage Notes (Signed)
Pt reports has been confused.  Pt keeps asking RN why her family has not called her.  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  Pt reports hx of low NA/K and thinks that is the problem. Disoriented to time, when asked month states "October" then asks RN if that is correct.  VSS. NAD. Unlabored.

## 2018-12-25 NOTE — ED Notes (Signed)
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

## 2018-12-25 NOTE — Telephone Encounter (Signed)
Patient calling this morning to let us know she missed virtual visit with Dr. Rockey Situ because she was sleeping due to illness.   Patient requested to speak with pamela and voiced confusion as to why she could not be directly transferred to nurse due to clinic staff being with patient in office.   Patient argumentative about it being 8 am in the morning and wanted to know what time the clinic starts taking patients.  After it was explained again that clinic for the day had already started and staff were with current patients states she had to speak urgently to Olin Hauser about an upcoming surgery and declined to provide further information.

## 2018-12-25 NOTE — Telephone Encounter (Signed)
Per clinical team lead, Juliann Pulse, no need for call back. Patient has been dismissed and letter was sent. Closing note.

## 2018-12-26 ENCOUNTER — Telehealth: Payer: Self-pay | Admitting: Cardiovascular Disease

## 2018-12-26 ENCOUNTER — Observation Stay: Payer: Medicare Other

## 2018-12-26 ENCOUNTER — Other Ambulatory Visit: Payer: Self-pay

## 2018-12-26 ENCOUNTER — Observation Stay
Admit: 2018-12-26 | Discharge: 2018-12-26 | Disposition: A | Discharge status: Home / Self Care | Payer: Medicare Other | Attending: Internal Medicine | Admitting: Internal Medicine

## 2018-12-26 DIAGNOSIS — R410 Disorientation, unspecified: Secondary | ICD-10-CM | POA: Diagnosis not present

## 2018-12-26 DIAGNOSIS — I361 Nonrheumatic tricuspid (valve) insufficiency: Secondary | ICD-10-CM | POA: Diagnosis not present

## 2018-12-26 DIAGNOSIS — I340 Nonrheumatic mitral (valve) insufficiency: Secondary | ICD-10-CM | POA: Diagnosis not present

## 2018-12-26 DIAGNOSIS — I6523 Occlusion and stenosis of bilateral carotid arteries: Secondary | ICD-10-CM | POA: Diagnosis not present

## 2018-12-26 DIAGNOSIS — R4182 Altered mental status, unspecified: Secondary | ICD-10-CM | POA: Diagnosis not present

## 2018-12-26 LAB — BASIC METABOLIC PANEL
Anion gap: 10 (ref 5–15)
BUN: 16 mg/dL (ref 8–23)
CO2: 26 mmol/L (ref 22–32)
Calcium: 9.5 mg/dL (ref 8.9–10.3)
Chloride: 104 mmol/L (ref 98–111)
Creatinine, Ser: 0.63 mg/dL (ref 0.44–1.00)
GFR calc Af Amer: >60 mL/min (ref >60)
GFR calc non Af Amer: >60 mL/min (ref >60)
Glucose, Bld: 141 mg/dL — ABNORMAL HIGH (ref 70–99)
Potassium: 4 mmol/L (ref 3.5–5.1)
Sodium: 140 mmol/L (ref 135–145)

## 2018-12-26 LAB — LIPID PANEL
Cholesterol: 199 mg/dL (ref 0–200)
HDL: 62 mg/dL (ref >40)
LDL Cholesterol: 75 mg/dL (ref 0–99)
Total CHOL/HDL Ratio: 3.2 RATIO
Triglycerides: 312 mg/dL — ABNORMAL HIGH (ref <150)
VLDL: 62 mg/dL — ABNORMAL HIGH (ref 0–40)

## 2018-12-26 LAB — CBC
HCT: 41.1 % (ref 36.0–46.0)
Hemoglobin: 14 g/dL (ref 12.0–15.0)
MCH: 30.7 pg (ref 26.0–34.0)
MCHC: 34.1 g/dL (ref 30.0–36.0)
MCV: 90.1 fL (ref 80.0–100.0)
Platelets: 290 10*3/uL (ref 150–400)
RBC: 4.56 MIL/uL (ref 3.87–5.11)
RDW: 14.1 % (ref 11.5–15.5)
WBC: 6.9 10*3/uL (ref 4.0–10.5)
nRBC: 0 % (ref 0.0–0.2)

## 2018-12-26 LAB — ECHOCARDIOGRAM COMPLETE
Height: 63 in
Weight: 2240 oz

## 2018-12-26 LAB — HIV ANTIBODY (ROUTINE TESTING W REFLEX): HIV Screen 4th Generation wRfx: NONREACTIVE

## 2018-12-26 LAB — SARS CORONAVIRUS 2 (TAT 6-24 HRS): SARS Coronavirus 2: NEGATIVE

## 2018-12-26 LAB — HEMOGLOBIN A1C
Hgb A1c MFr Bld: 6 % — ABNORMAL HIGH (ref 4.8–5.6)
Mean Plasma Glucose: 125.5 mg/dL

## 2018-12-26 MED ORDER — MORPHINE SULFATE (PF) 2 MG/ML IV SOLN
2.0000 mg | INTRAVENOUS | Status: DC | PRN
  Administered 2018-12-27: 2 mg via INTRAVENOUS
  Filled 2018-12-26: qty 1

## 2018-12-26 MED ORDER — LEVOTHYROXINE SODIUM 50 MCG PO TABS
50.0000 ug | ORAL_TABLET | Freq: Every day | ORAL | Status: DC
  Administered 2018-12-26 – 2018-12-27 (×2): 50 ug via ORAL
  Filled 2018-12-26 (×2): qty 1

## 2018-12-26 MED ORDER — ENSURE ENLIVE PO LIQD: 237.0000 mL | Freq: Three times a day (TID) | ORAL | Status: DC

## 2018-12-26 MED ORDER — IBUPROFEN 400 MG PO TABS
400.0000 mg | ORAL_TABLET | Freq: Once | ORAL | Status: DC
  Filled 2018-12-26: qty 1

## 2018-12-26 MED ORDER — HYDRALAZINE HCL 20 MG/ML IJ SOLN: 5.0000 mg | Freq: Four times a day (QID) | INTRAMUSCULAR | Status: DC | PRN

## 2018-12-26 MED ORDER — ALUM & MAG HYDROXIDE-SIMETH 200-200-20 MG/5ML PO SUSP
30.0000 mL | Freq: Once | ORAL | Status: AC
  Administered 2018-12-27: 30 mL via ORAL
  Filled 2018-12-26: qty 30

## 2018-12-26 NOTE — Telephone Encounter (Signed)
Patient states she is in [facility] Rm 117 , states she had a stroke last night. Asked to talk to Dr. Rockey Situ.

## 2018-12-26 NOTE — H&P (Signed)
History and Physical    Angela [name] [id number] DOB: 1950/07/20 DOA: 12/25/2018  PCP: Angela Mc, MD (Confirm with patient/family/NH records and if not entered, this has to be entered at Angela [name] point of entry) Patient coming from: home  I have personally briefly reviewed patient's old medical records in [place]  Chief Complaint: confusion  HPI: Angela [name] is a 68 y.o. female with medical history significant hypothyroidism and depression as well as a history of TIA who presents to the emergency room concerns for confusion.  Was advised to come to the emergency room by a friend who was seated at the bedside who noted that she appeared confused.  In the emergency room patient is oriented to person and place but not to time.  He otherwise cannot give a good history.  He lives independently. She denies chest pains or shortness of breath.  He denies headache or blurred vision or weakness numbness or tingling to face or extremities.  Work-up in the emergency room was mostly unremarkable but she was seen to have a slightly depressed TSH.  Sedation was requested.   Review of Systems: As per HPI otherwise 10 point review of systems negative.    Past Medical History:  Diagnosis Date  . Allergic rhinitis   . Anemia   . Blurred vision   . Depression   . Diverticulosis   . Endometriosis   . Falls   . GERD (gastroesophageal reflux disease)   . Hernia, hiatal   . Hypothyroidism   . IBS (irritable bowel syndrome)   . Malignant neoplasm of skin   . Migraine   . Neuropathy   . PTSD (post-traumatic stress disorder)   . Thyroid disease     Past Surgical History:  Procedure Laterality Date  . APPENDECTOMY    . CHOLECYSTECTOMY    . CHOLECYSTECTOMY, LAPAROSCOPIC    . ESOPHAGOGASTRODUODENOSCOPY (EGD) WITH PROPOFOL N/A 03/29/2015   Procedure: ESOPHAGOGASTRODUODENOSCOPY (EGD) WITH PROPOFOL;  Surgeon: Angela Class, MD;  Location: [facility] ENDOSCOPY;  Service:  Endoscopy;  Laterality: N/A;  . HERNIA REPAIR    . laproscopy    . TONSILLECTOMY    . uteral suspension       reports that she quit smoking about 42 years ago. She has never used smokeless tobacco. She reports current alcohol use. She reports that she does not use drugs.  Allergies  Allergen Reactions  . Advair Diskus [Fluticasone-Salmeterol] Shortness Of Breath  . Bee Venom Hives  . Darvon [Propoxyphene] Anaphylaxis and Hives    Hives/throat swelling   . Dicyclomine Itching  . Other Hives    Spider bites cause hives  . Oxycodone-Acetaminophen Nausea And Vomiting and Other (See Comments)    hallucinations   . Peanuts [Peanut Oil] Anaphylaxis and Hives  . Penicillins Hives    Has patient had a PCN reaction causing immediate rash, facial/tongue/throat swelling, SOB or lightheadedness with hypotension: Unknown Has patient had a PCN reaction causing severe rash involving mucus membranes or skin necrosis: Unknown Has patient had a PCN reaction that required hospitalization: Unknown Has patient had a PCN reaction occurring within the last 10 years: Unknown If all of the above answers are "NO", then may proceed with Cephalosporin use.  . Sulfa Antibiotics Anaphylaxis, Hives and Itching  . Sulfacetamide Sodium Anaphylaxis, Hives and Itching  . Ultram [Tramadol] Hives  . Amikacin Nausea And Vomiting  . Aspirin Hives  . Haloperidol Nausea And Vomiting  . Hymenoptera Venom Preparations Hives  Reaction to spider bites  . Imipramine Hives    Reaction to Tofranil  . Keflex [Cephalexin] Hives  . Lactose   . Lactose Intolerance (Gi)   . Risperdal [Risperidone]     Serotonin syndrome  . Sulfur Hives  . Adhesive [Tape] Hives and Rash  . Albuterol Palpitations    Tolerates levalbuterol  . Ciprofloxacin Other (See Comments)    tendonopathy  . Hydroxyzine Anxiety and Itching  . Peanut Butter Flavor Anxiety  . Prednisone Nausea And Vomiting, Anxiety and Other (See Comments)    Crazy  mood swings, strange thoughts. Tolerates depomedrol     Family History  Problem Relation Age of Onset  . Heart disease Father   . Hearing loss Father   . COPD Father   . Depression Father   . Stroke Father   . Vision loss Father   . Varicose Veins Father   . Cancer Mother        lung  . Depression Sister   . Arthritis Sister   . Arthritis Brother   . Hernia Brother   . Anxiety disorder Brother   . Arthritis Brother   . Hernia Brother   . Anxiety disorder Brother   . Urolithiasis Neg Hx   . Kidney disease Neg Hx   . Kidney cancer Neg Hx   . Prostate cancer Neg Hx      Prior to Admission medications   Medication Sig Start Date End Date Taking? Authorizing Provider  acetaminophen (TYLENOL) 650 MG CR tablet Take 650 mg by mouth every 8 (eight) hours as needed for pain.    [provider]  Acetylcysteine (NAC) 500 MG CAPS Take 500 mg by mouth 2 (two) times a day.    [provider]  albuterol (VENTOLIN HFA) 108 (90 Base) MCG/ACT inhaler Inhale 2 puffs into the lungs every 4 (four) hours as needed for wheezing or shortness of breath. 11/27/18   Angela Mc, MD  Amino Acids-Protein Hydrolys (FEEDING SUPPLEMENT, PRO-STAT 64,) LIQD Take 30 mLs by mouth daily. 04/12/18   Poulose, Angela Born, NP  atorvastatin (LIPITOR) 20 MG tablet Take 1 tablet (20 mg total) by mouth at bedtime. 11/26/18   Angela Mc, MD  azithromycin (ZITHROMAX) 250 MG tablet Take 1 tablet (250 mg total) by mouth every other day. Take every Mon, Wed, Fri 11/26/18   Angela Mc, MD  Biotin 10000 MCG TABS Take 10,000 mcg by mouth daily.     [provider]  Calcium Citrate (CITRACAL PO) Take 800 mg by mouth. Takes 2 (400mg ) tablets BID    [provider]  Cholecalciferol 25 MCG (1000 UT) tablet Take 1 tablet (1,000 Units total) by mouth daily. 12/26/17   Clapacs, Angela Reno, MD  Cyanocobalamin (VITAMIN B-12) 1000 MCG SUBL Place 1,000 mcg under the tongue.     [provider]  diclofenac sodium (VOLTAREN) 1 % GEL Apply small amount (2 grams) topically over knees or hands up to four times a day if needed for arthritis 11/26/18   Angela Mc, MD  doxycycline (VIBRA-TABS) 100 MG tablet Take 1 tablet (100 mg total) by mouth 2 (two) times daily. 12/02/18   Flora Lipps, MD  estradiol (ESTRING) 2 MG vaginal ring Place 2 mg vaginally every 3 (three) months. follow package directions 10/29/18   Rubie Maid, MD  famotidine (PEPCID) 20 MG tablet Take 1 tablet (20 mg total) by mouth 2 (two) times daily as needed for heartburn or indigestion. 11/26/18  Angela Mc, MD  ferrous sulfate 325 (65 FE) MG tablet Take 1 tablet (325 mg total) by mouth daily with breakfast. 11/26/18   Angela Mc, MD  fluconazole (DIFLUCAN) 150 MG tablet TAKE 1 TABLET EACH WEEK WHILE TAKING DOXYCYCLINE 07/27/18   [provider]  fluticasone (FLONASE) 50 MCG/ACT nasal spray Place 2 sprays into both nostrils daily.     [provider]  gabapentin (NEURONTIN) 100 MG capsule Take 100 mg by mouth 2 (two) times daily.    [provider]  hydrOXYzine (ATARAX/VISTARIL) 10 MG tablet TAKE 1 TO 2 TABLETS BY MOUTH ONCE DAILY AS NEEDED 02/05/18   [provider]  Icosapent Ethyl (VASCEPA) 1 g CAPS TAKE 2 CAPSULES BY MOUTH TWICE DAILY (TO HELP WITH TRIGLYCERIDES) 11/26/18   Angela Mc, MD  Lactulose 20 GM/30ML SOLN 30 ml every 4 hours until constipation is relieved 11/08/18   Angela Mc, MD  levalbuterol (XOPENEX) 0.63 MG/3ML nebulizer solution Take 3 mLs (0.63 mg total) by nebulization every 4 (four) hours as needed for wheezing or shortness of breath. 11/26/18   Angela Mc, MD  levothyroxine (SYNTHROID) 100 MCG tablet TAKE 1 TABLET BY MOUTH ONCE DAILY AT  6AM 11/26/18   Angela Mc, MD  linaclotide [facility]) 290 MCG CAPS capsule Take 1 capsule (290 mcg total) by mouth daily before breakfast. 12/26/17   Clapacs, Angela Reno, MD  LORazepam  (ATIVAN) 0.5 MG tablet TAKE 1 TABLET BY MOUTH AS NEEDED ONCE DAILY 02/06/18   [provider]  losartan (COZAAR) 50 MG tablet Take 1 tablet (50 mg total) by mouth daily. 11/26/18 02/24/19  Angela Mc, MD  Magnesium 500 MG TABS Take 500 mg by mouth every other day.    [provider]  Multiple Vitamin (MULTIVITAMIN WITH MINERALS) TABS tablet Take 1 tablet by mouth daily. 12/26/17   Clapacs, Angela Reno, MD  ondansetron (ZOFRAN ODT) 4 MG disintegrating tablet Allow 1-2 tablets to dissolve in your mouth every 8 hours as needed for nausea/vomiting 12/04/18   Hinda Kehr, MD  PARoxetine (PAXIL) 20 MG tablet Take 20 mg by mouth daily.    [provider]  prednisoLONE acetate (PRED FORTE) 1 % ophthalmic suspension INSTILL 1 DROP INTO EACH EYE TWICE DAILY 07/26/18   [provider]  pregabalin (LYRICA) 75 MG capsule Take 75 mg by mouth 2 (two) times daily.     [provider]  PREMARIN vaginal cream Place 1 Applicatorful vaginally 2 (two) times a week. 11/28/18   Angela Mc, MD  pyridoxine (B-6) 100 MG tablet Take 1 tablet (100 mg total) by mouth 2 (two) times daily. 12/26/17   Clapacs, Angela Reno, MD  tiZANidine (ZANAFLEX) 2 MG tablet Take by mouth every 6 (six) hours as needed for muscle spasms.    [provider]  traZODone (DESYREL) 50 MG tablet Take 1 tablet (50 mg total) by mouth at bedtime as needed. Pt takes 1/2 tablet qhs 11/26/18   Angela Mc, MD  triamcinolone ointment (KENALOG) 0.5 % Apply 1 application topically 2 (two) times daily. 11/26/18   Angela Mc, MD  umeclidinium bromide (INCRUSE ELLIPTA) 62.5 MCG/INH AEPB Inhale 1 puff into the lungs daily. 11/26/18   Angela Mc, MD  XIIDRA 5 % SOLN INSTILL 1 DROP INTO [name] EYE TWICE DAILY 07/27/18   [provider]    Physical Exam: Vitals:   12/25/18 2056 12/25/18 2221 12/26/18 0247 12/26/18 0444  BP: (!) 188/85  129/90 (!) 148/99 (!) 142/91  Pulse: (!) 58 62 65 66   Resp: 15 17  16   Temp:   98 F (36.7 C) (!) 97.1 F (36.2 C)  TempSrc:   Oral Axillary  SpO2: 98% 99% 100% 100%  Weight:      Height:        Constitutional: NAD, calm, comfortable Vitals:   12/25/18 2056 12/25/18 2221 12/26/18 0247 12/26/18 0444  BP: (!) 188/85 129/90 (!) 148/99 (!) 142/91  Pulse: (!) 58 62 65 66  Resp: 15 17  16   Temp:   98 F (36.7 C) (!) 97.1 F (36.2 C)  TempSrc:   Oral Axillary  SpO2: 98% 99% 100% 100%  Weight:      Height:       Eyes: PERRL, lids and conjunctivae normal ENMT: Mucous membranes are moist. Posterior pharynx clear of any exudate or lesions.Normal dentition.  Neck: normal, supple, no masses, no thyromegaly Respiratory: clear to auscultation bilaterally, no wheezing, no crackles. Normal respiratory effort. No accessory muscle use.  Cardiovascular: Regular rate and rhythm, no murmurs / rubs / gallops. No extremity edema. 2+ pedal pulses. No carotid bruits.  Abdomen: no tenderness, no masses palpated. No hepatosplenomegaly. Bowel sounds positive.  Musculoskeletal: no clubbing / cyanosis. No joint deformity upper and lower extremities. Good ROM, no contractures. Normal muscle tone.  Skin: no rashes, lesions, ulcers. No induration Neurologic: CN 2-12 grossly intact. Sensation intact, DTR normal. Strength 5/5 in all 4.  Psychiatric: Normal judgment and insight. Alert and oriented x 2 Normal mood.    Labs on Admission: I have personally reviewed following labs and imaging studies  CBC: Recent Labs  Lab 12/25/18 1623 12/26/18 0627  WBC 5.9 6.9  HGB 12.9 14.0  HCT 37.4 41.1  MCV 89.3 90.1  PLT 276 [id number]   Basic Metabolic Panel: Recent Labs  Lab 12/25/18 1623  NA 139  K 4.0  CL 102  CO2 28  GLUCOSE 97  BUN 15  CREATININE 0.72  CALCIUM 9.2   GFR: Estimated Creatinine Clearance: 58.7 mL/min (by C-G formula based on SCr of 0.72 mg/dL). Liver Function Tests: Recent Labs  Lab 12/25/18 1623  AST 55*  ALT 62*  ALKPHOS 98  BILITOT  0.4  PROT 7.4  ALBUMIN 4.3   No results for input(s): LIPASE, AMYLASE in the last 168 hours. No results for input(s): AMMONIA in the last 168 hours. Coagulation Profile: No results for input(s): INR, PROTIME in the last 168 hours. Cardiac Enzymes: No results for input(s): CKTOTAL, CKMB, CKMBINDEX, TROPONINI in the last 168 hours. BNP (last 3 results) No results for input(s): PROBNP in the last 8760 hours. HbA1C: No results for input(s): HGBA1C in the last 72 hours. CBG: No results for input(s): GLUCAP in the last 168 hours. Lipid Profile: No results for input(s): CHOL, HDL, LDLCALC, TRIG, CHOLHDL, LDLDIRECT in the last 72 hours. Thyroid Function Tests: Recent Labs    12/25/18 1623  TSH 1.806  FREET4 1.15*   Anemia Panel: No results for input(s): VITAMINB12, FOLATE, FERRITIN, TIBC, IRON, RETICCTPCT in the last 72 hours. Urine analysis:    Component Value Date/Time   COLORURINE COLORLESS (A) 12/25/2018 2054   APPEARANCEUR CLEAR (A) 12/25/2018 2054   APPEARANCEUR Clear 10/30/2014 1037   LABSPEC 1.005 12/25/2018 2054   LABSPEC 1.002 04/14/2014 1512   PHURINE 8.0 12/25/2018 2054   GLUCOSEU NEGATIVE 12/25/2018 2054   GLUCOSEU Negative 04/14/2014 1512   HGBUR NEGATIVE 12/25/2018 2054   BILIRUBINUR NEGATIVE 12/25/2018  2054   BILIRUBINUR neg 09/04/2018 1412   BILIRUBINUR Negative 10/30/2014 1037   BILIRUBINUR Negative 04/14/2014 [place] 12/25/2018 2054   PROTEINUR NEGATIVE 12/25/2018 2054   UROBILINOGEN 0.2 09/04/2018 1412   UROBILINOGEN 0.2 01/15/2013 1820   NITRITE NEGATIVE 12/25/2018 2054   LEUKOCYTESUR NEGATIVE 12/25/2018 2054   LEUKOCYTESUR Negative 04/14/2014 1512    Radiological Exams on Admission: CT Head Wo Contrast  Result Date: 12/25/2018 CLINICAL DATA:  Altered level of consciousness EXAM: CT HEAD WITHOUT CONTRAST TECHNIQUE: Contiguous axial images were obtained from the base of the skull through the vertex without intravenous contrast.  COMPARISON:  07/11/2018 FINDINGS: Brain: Stable lacunar infarct within the head of the caudate nucleus on the right. No new hemorrhage, infarct or space-occupying mass lesion is seen. Vascular: No hyperdense vessel or unexpected calcification. Skull: Normal. Negative for fracture or focal lesion. Sinuses/Orbits: No acute finding. Other: None. IMPRESSION: Chronic changes without acute abnormality. Electronically Signed   By: Inez Catalina M.D.   On: 12/25/2018 19:23    EKG: Independently reviewed.   Assessment/Plan Principal Problem:   Altered mental status, unspecified --Uncertain etiology.  Suspect mild dementia, cognitive dysfunction --CT negative  --Aspirin, statin given history of TIA --Tenuous cardiac monitoring, echocardiogram and carotid Doppler TSH in the setting of acquired hypothyroidism --Thyroxine on hold for a few days       Athena Masse MD [facility]  If 7PM-7AM, please contact night-coverage [email or web address] Password [email or web address]  12/26/2018, 7:18 AM

## 2018-12-26 NOTE — Progress Notes (Signed)
OT Cancellation Note  Patient Details Name: Angela [name] MRN: [id number] DOB: 1950-11-15   Cancelled Treatment:    Reason Eval/Treat Not Completed: Patient at procedure or test/ unavailable. Thank you for the OT consult. Order received and chart reviewed. Upon arrival to pt room, pt noted to be off the unit for testing. Will follow acutely and re-attempt OT evaluation at a later time/date as available.   Shara Blazing, M.S., OTR/L Ascom: [phone] 12/26/18, 2:12 PM

## 2018-12-26 NOTE — Evaluation (Signed)
Physical Therapy Evaluation Patient Details Name: Angela [name] MRN: [id number] DOB: [birth year] Today's Date: 12/26/2018   History of Present Illness  68 y.o. female with medical history significant hypothyroidism and depression as well as a history of TIA who presents to the emergency room concerns for confusion.  Was advised to come to the emergency room by a friend who was seated at the bedside who noted that she appeared confused.   Clinical Impression  Pt is able to mobilize in the room w/o AD, don socks/shoes at EOB, walk >300 ft with 4WW and negotiate up/down a few steps (with questionable safety awareness).  Unsure as to her typical baseline but pt was aware of situation, appropriately interactive (if somewhat tangential and needing redirection at times) and from a purely PT stand-point should be able to return home.  She would benefit from HHPT to address some minimal safety awareness issues and insure that she is safe transitioning back to her rental - though she also indicates that she is fed up with her landlord and desires to move though she does not have any concrete plans in this regard as of right now.      Follow Up Recommendations Home health PT    Equipment Recommendations  None recommended by PT(ramp and/or rails as well as bathroom grab bars)    Recommendations for Other Services       Precautions / Restrictions Precautions Precautions: Fall Restrictions Weight Bearing Restrictions: No      Mobility  Bed Mobility Overal bed mobility: Independent             General bed mobility comments: Pt able to get to EOB and dof/don socks/shoes w/o assist  Transfers Overall transfer level: Independent Equipment used: Scientist, clinical (histocompatibility and immunogenetics) transfer comment: Pt able to rise to standing with and w/o AD on multiple attempts, showed good effort and confidence though some impulsivity with minimal unsteadiness but no  LOBs  Ambulation/Gait Ambulation/Gait assistance: Supervision Gait Distance (Feet): 300 Feet Assistive device: 4-wheeled walker       General Gait Details: Pt walks with good confidence and consistent cadence for prolonged ambulation.  O2 and HR stable and appropriate t/o the session.  Stairs Stairs: Yes Stairs assistance: Min guard Stair Management: No rails Number of Stairs: 3 General stair comments: Pt was able to retro negotiate up a few steps with 4WW, did not show appropriate safety awareness with brake usage - was surprised when PT made suggestion to be more aware with walker/breaks  Wheelchair Mobility    Modified Rankin (Stroke Patients Only)       Balance Overall balance assessment: Modified Independent;Mild deficits observed, not formally tested(Pt with a few small stagger/sway bouts but no overt LOBs)                                           Pertinent Vitals/Pain Pain Assessment: (chronic pain, no acute issues)    Home Living Family/patient expects to be discharged to:: Private residence Living Arrangements: Alone Available Help at Discharge: Friend(s)   Home Access: Stairs to enter Entrance Stairs-Rails: None Entrance Stairs-Number of Steps: 4   Home Equipment: Environmental consultant - 4 wheels      Prior Function Level of Independence: Independent with assistive device(s)         Comments: Pt's friend drives  her or she takes public transport, explicit about lack of assist from and some conflict with her landlord and housing issues     Hand Dominance        Extremity/Trunk Assessment   Upper Extremity Assessment Upper Extremity Assessment: Overall WFL for tasks assessed    Lower Extremity Assessment Lower Extremity Assessment: Overall WFL for tasks assessed       Communication   Communication: No difficulties  Cognition Arousal/Alertness: Awake/alert Behavior During Therapy: Restless Overall Cognitive Status: Within Functional  Limits for tasks assessed                                 General Comments: unsure of pt's true baseline, she was aware to self and situation, able to maintain appropriate conversation though at times tangential and needing redirection, also showing some impulsivity and/or questionable safety decisions      General Comments      Exercises     Assessment/Plan    PT Assessment Patient needs continued PT services  PT Problem List Decreased strength;Decreased range of motion;Decreased activity tolerance;Decreased balance;Decreased mobility;Decreased cognition;Decreased knowledge of use of DME;Decreased coordination;Decreased knowledge of precautions;Decreased safety awareness       PT Treatment Interventions DME instruction;Gait training;Stair training;Functional mobility training;Therapeutic activities;Therapeutic exercise;Balance training;Neuromuscular re-education;Cognitive remediation;Patient/family education    PT Goals (Current goals can be found in the Care Plan section)  Acute Rehab PT Goals Patient Stated Goal: pt planning on going home but states she wants to move out as soon as possible PT Goal Formulation: With patient Time For Goal Achievement: 01/09/19 Potential to Achieve Goals: Fair    Frequency Min 2X/week   Barriers to discharge        Co-evaluation               AM-PAC PT "6 Clicks" Mobility  Outcome Measure Help needed turning from your back to your side while in a flat bed without using bedrails?: None Help needed moving from lying on your back to sitting on the side of a flat bed without using bedrails?: None Help needed moving to and from a bed to a chair (including a wheelchair)?: None Help needed standing up from a chair using your arms (e.g., wheelchair or bedside chair)?: None Help needed to walk in hospital room?: None Help needed climbing 3-5 steps with a railing? : A Little 6 Click Score: 23    End of Session Equipment  Utilized During Treatment: Gait belt Activity Tolerance: Patient tolerated treatment well Patient left: with chair alarm set;with call bell/phone within reach Nurse Communication: Mobility status(requests stacks/ensure) PT Visit Diagnosis: Muscle weakness (generalized) (M62.81);Unsteadiness on feet (R26.81)    Time: [id number] PT Time Calculation (min) (ACUTE ONLY): 39 min   Charges:   PT Evaluation $PT Eval Low Complexity: 1 Low PT Treatments $Gait Training: 8-22 mins        Kreg Shropshire, DPT 12/26/2018, 1:27 PM

## 2018-12-26 NOTE — Progress Notes (Signed)
Patient is alert and oriented X4, however keeps asking the same questions multiple times.  Patient continues to be convinced that her sodium and potassium are low, stating that's what was her issues is.  Patient has been reassured several times that both are WNL.  Patient also asking several times to see the doctor.  Made her aware that she was assessed by the admitting MD in the ER and will be seen again this morning mostly like after 0800. Patient unhappy with this answer and becomes very derogatory with staff, stating that she has a medical background and that she knows how this works. After redirecting patient, she was giving a new sandwich tray at her request as well as fresh ice water and some cranberry juiced.  Patient slept for maybe 1.5 hours and then the entire process was repeated.  This time she was asking for Dr. Rockey Situ to be made aware that she is here, stating that he has been trying to get in touch with her.  I made patient aware that she should discuss this with the rounding MD that will see her this morning and that if he felt that patient should be seen by Dr.Gollan, then he would order a consult.  Patient once again became very derogatory with staff. I suggest to the patient that is was still very early in the morning and that she should attempt to get some more sleep, patient agreed and was asking for a bible. One was not present in the drawers where they are normally, and patient asked about a chaplain coming to speak with her. Made her aware that I would see if one was available. Patient thanked nurse. When doing bedside report with Gerald Stabs, RN patient once again asking for [place] to call Dr. Rockey Situ, patient once again made aware to discuss with rounding MD. Patient very unhappy with this answer.  [name], BSN

## 2018-12-26 NOTE — Progress Notes (Signed)
*  PRELIMINARY RESULTS* Echocardiogram 2D Echocardiogram has been performed.  Angela [name] 12/26/2018, 9:04 AM

## 2018-12-26 NOTE — Progress Notes (Deleted)
Patient was admitted this morning.  Chart reviewed history and and exam reviewed .Angela [name] is a 68 y.o. female with medical history significant hypothyroidism and depression as well as a history of TIA who presents to the emergency room concerns for confusion.  No acute abnormality. Plan for cardiac monitoring, echo, carotid Doppler.  Follow-up

## 2018-12-26 NOTE — Evaluation (Addendum)
Speech Language Pathology Evaluation Patient Details Name: Angela [name] MRN: [id number] DOB: 30-Nov-1950 Today's Date: 12/26/2018 Time: [id number] SLP Time Calculation (min) (ACUTE ONLY): 43 min  Problem List:  Patient Active Problem List   Diagnosis Date Noted  . Altered mental status, unspecified 12/25/2018  . Confusion state 12/25/2018  . Abnormal TSH 12/25/2018  . Acquired claw toe of both feet 10/23/2018  . Dermatitis 10/22/2018  . Cough productive of yellow sputum 08/08/2018  . Malnutrition of mild degree ([place]) 08/08/2018  . Mixed stress and urge urinary incontinence 08/08/2018  . Magnesium deficiency 08/08/2018  . Aortic atherosclerosis ([place]) 05/31/2018  . Chest pain of uncertain etiology [id number]  . Achilles tendon disorder 04/25/2018  . Insomnia 04/25/2018  . Diastolic dysfunction [id number]  . Mitral regurgitation 03/29/2018  . Bronchospasm 03/15/2018  . Axis II diagnosis deferred 02/20/2018  . Preventative health care 02/08/2018  . Polypharmacy 02/01/2018  . Personality disorder ([place]) 12/25/2017  . Noncompliance 12/25/2017  . Medication monitoring encounter 12/07/2017  . Family history of high cholesterol 12/07/2017  . Arthritis of hand, degenerative 12/07/2017  . Bipolar I disorder, most recent episode mixed, severe with psychotic features ([place]) 09/18/2017  . Agitation 09/18/2017  . Depressed mood 07/11/2017  . Hallux valgus, acquired 07/05/2017  . Hammer toes of both feet 07/05/2017  . Unstable ankle 07/05/2017  . Chronic bilateral low back pain without sciatica 06/09/2017  . Tinea unguium 05/08/2017  . Conductive hearing loss of right ear with unrestricted hearing of left ear 04/03/2017  . Adjustment disorder with depressed mood 01/18/2017  . Migraine 01/18/2017  . IBS (irritable bowel syndrome) 11/17/2016  . Bilateral carpal tunnel syndrome 11/17/2016  . Plantar fasciitis, bilateral 11/17/2016  . Cervical myofascial pain syndrome 11/17/2016  .  Chronic hyponatremia 11/17/2016  . PTSD (post-traumatic stress disorder) 11/17/2016  . Hiatal hernia 11/17/2016  . Vitamin D deficiency 11/17/2016  . Vitamin B12 deficiency 11/17/2016  . SIADH (syndrome of inappropriate ADH production) ([place]) 09/08/2016  . Cervical spondylosis with myelopathy 05/24/2016  . MCI (mild cognitive impairment) with memory loss 03/30/2016  . Gait abnormality 03/30/2016  . Chronic bipolar affective disorder ([place]) 01/24/2016  . Recurrent major depressive disorder, in partial remission ([place]) 01/24/2016  . Anemia, iron deficiency 06/24/2015  . Benign essential HTN 06/24/2015  . History of falling 05/22/2015  . History of TIA (transient ischemic attack) 04/20/2015  . Hypothyroidism 04/20/2015  . GERD (gastroesophageal reflux disease) 04/20/2015  . Cervical disc disorder at C5-C6 level with radiculopathy 03/25/2015  . Chronic neck pain 03/25/2015  . Pelvic pain in female 10/30/2014  . History of skin cancer 06/30/2014  . Chronic sinusitis 06/30/2014  . Chronic headaches 12/16/2013  . Neuropathy 12/16/2013   Past Medical History:  Past Medical History:  Diagnosis Date  . Allergic rhinitis   . Anemia   . Blurred vision   . Depression   . Diverticulosis   . Endometriosis   . Falls   . GERD (gastroesophageal reflux disease)   . Hernia, hiatal   . Hypothyroidism   . IBS (irritable bowel syndrome)   . Malignant neoplasm of skin   . Migraine   . Neuropathy   . PTSD (post-traumatic stress disorder)   . Thyroid disease    Past Surgical History:  Past Surgical History:  Procedure Laterality Date  . APPENDECTOMY    . CHOLECYSTECTOMY    . CHOLECYSTECTOMY, LAPAROSCOPIC    . ESOPHAGOGASTRODUODENOSCOPY (EGD) WITH PROPOFOL N/A 03/29/2015   Procedure: ESOPHAGOGASTRODUODENOSCOPY (EGD) WITH PROPOFOL;  Surgeon:  Josefine Class, MD;  Location: [facility] ENDOSCOPY;  Service: Endoscopy;  Laterality: N/A;  . HERNIA REPAIR    . laproscopy    . TONSILLECTOMY    .  uteral suspension     HPI:  Pt is a 68 y.o. female with medical history significant for Hiatal Hernia, GERD, IBS, hypothyroidism, and depression as well as a history of TIA who presents to the emergency room concerns for confusion.  Was advised to come to the emergency room by a friend d/t the confusion.  In the emergency room patient is oriented to person and place but not to time.  She lives independently.  She denies chest pains or shortness of breath, headache or blurred vision or weakness numbness or tingling to face or extremities.  Work-up in the emergency room was mostly unremarkable but she was seen to have a slightly depressed TSH. Sedation was requested.  Per Head CT, "Chronic changes without acute abnormality".  Pt verbally conversive w/ NSG in room re: her Medications - she knew them by sight and name and requested the ones she did not have but felt she needed to be on.(NSG to f/u w/ MD).  Pt also wanted to speak w/ her Cardiologist.  MD note has listed MCI(mild cognitive impairment) in her problem list.   Assessment / Plan / Recommendation Clinical Impression  Pt appears to present w/ functional communication skills c/b grossly adequate speech-language abilities w/ no overt receptive or expressive language deficits noted -- No auditory comprehension deficits; No expressive language deficits. With regard to Cognitive impact on communication, pt conversed at conversational level often verbose and min tangential giving information requiring redirection to main question. Pt was able to sustain attention to tasks w/ SLP w/ verbal cues required intermittently. Problem solving responses were grossly [facility]; she was able to id #[id number] for emergencies. She added/discussed she has "problems w/ her neighbors" and that she was an "existential thinking". With regard to her Cognitive status, there is uncertainty of her Baseline Cognitive functioning w/ no family present to given details. Per MD note: MCI(mild  cognitive impairment) is listed. Per Head CT: "Chronic changes without acute abnormality". Pt endorsed she does "not remember" some of the event/time before coming to the hospital but she was overall oriented to person, place, situation. During Motor Speech, she c/o "min slurred" speech ~2 days ago but agreed it was "better now". Currently, a slight decreased tone was noted in Left facial-labial tone/movement; no other gross unilateral weakness appreciated. Pt has had a TIA in the past(?). When pt spoke carefully, slowed her rate, and over-articulated slightly, her articulation precision was WNL. Full intelligibility was noted w/ all conversation during this session; by NSG during other care times even when not using the strategies.  At Discharge, pt may benefit from formal f/u w/ Neurology and ST services post discharge for more formal assessment to determine if any decline in status from her Baseline; any Cognitive-linguistic needs to address/promote safe, ADLs. NSG updated.     SLP Assessment  SLP Recommendation/Assessment: All further Speech Lanaguage Pathology  needs can be addressed in the next venue of care SLP Visit Diagnosis: Cognitive communication deficit (R41.841)    Follow Up Recommendations  Home health SLP    Frequency and Duration (TBD)  (TBD)      SLP Evaluation Cognition  Overall Cognitive Status: No family/caregiver present to determine baseline cognitive functioning(Difficult to assess) Arousal/Alertness: Awake/alert Orientation Level: Oriented to person;Oriented to place;Oriented to situation Attention: Focused;Sustained Focused Attention: Appears  intact(fair) Sustained Attention: Appears intact(fair) Memory: Impaired(for the event and time of event; otherwise grossly WFL) Awareness: Appears intact Problem Solving: Appears intact(Fair) Executive Function: Decision Making Decision Making: Appears intact(grossly WFL) Behaviors: Restless(constant  conversation) Safety/Judgment: Appears intact(Fair - ID #[id number] for emergencies)       Comprehension  Auditory Comprehension Overall Auditory Comprehension: Appears within functional limits for tasks assessed(grossly [facility]) Visual Recognition/Discrimination Discrimination: Not tested Reading Comprehension Reading Status: Within funtional limits    Expression Expression Primary Mode of Expression: Verbal Verbal Expression Overall Verbal Expression: Appears within functional limits for tasks assessed(grossly [facility]) Written Expression Dominant Hand: Right Written Expression: Not tested   Oral / Motor  Oral Motor/Sensory Function Overall Oral Motor/Sensory Function: Mild impairment(-Slight) Facial ROM: Reduced left(slight) Facial Symmetry: Abnormal symmetry left(slight) Facial Strength: Reduced left(slight) Facial Sensation: Within Functional Limits Lingual ROM: Within Functional Limits Lingual Symmetry: Within Functional Limits Lingual Strength: Within Functional Limits Velum: Within Functional Limits Mandible: Within Functional Limits Motor Speech Overall Motor Speech: Impaired(slight - Left side) Respiration: Within functional limits Phonation: Normal Resonance: Within functional limits Articulation: Within functional limitis(grossly WFL; slight decreased precision during rapid speech) Intelligibility: Intelligible(grossly WFL) Motor Planning: Witnin functional limits Motor Speech Errors: Not applicable Effective Techniques: Slow rate;Increased vocal intensity;Over-articulate   GO                      Orinda Kenner, MS, CCC-SLP Christianne Zacher 12/26/2018, 3:00 PM

## 2018-12-26 NOTE — Progress Notes (Signed)
PROGRESS NOTE    Angela [name]  [id number] DOB: Aug 14, 1950 DOA: 12/25/2018 PCP: Crecencio Mc, MD    Brief Narrative:   Angela [name] is a 68 y.o. female with medical history significant hypothyroidism and depression as well as a history of TIA who presents to the emergency room concerns for confusion.  Was advised to come to the emergency room by a friend who was seated at the bedside who noted that she appeared confused. CT head negative for acute abnormality   Consultants:   None  Procedures: CT of head  Antimicrobials:   None   Subjective: Patient would like MRI as she feels dizzy.  She is oriented to time place and person.  She is also demanding to see her cardiologist even though there is no acute cardiac issues.  She also wants to review her normal labs.  She reports she does not remember getting the CT scan but also states her confusion could be from her medications.  He reports she has to see her cardiologist because she has a hiatal hernia pressing on her heart.  Objective: Vitals:   12/26/18 0444 12/26/18 0740 12/26/18 0748 12/26/18 0918  BP: (!) 142/91 139/73 (!) 110/48 (!) 143/93  Pulse: 66 61 72 60  Resp: 16     Temp: (!) 97.1 F (36.2 C) 98.2 F (36.8 C)    TempSrc: Axillary Oral    SpO2: 100% 96% 100%   Weight:      Height:        Intake/Output Summary (Last 24 hours) at 12/26/2018 1557 Last data filed at 12/26/2018 0900 Gross per 24 hour  Intake 240 ml  Output --  Net 240 ml   Filed Weights   12/25/18 1622  Weight: 63.5 kg    Examination:  General exam: Appears comfortable , nad Respiratory system: Clear to auscultation. Respiratory effort normal. Cardiovascular system: S1 & S2 heard, RRR. No JVD, murmurs, rubs, gallops or clicks Gastrointestinal system: Abdomen is nondistended, soft and nontender. No organomegaly or masses felt. Normal bowel sounds heard. Central nervous system: Alert and oriented. No focal neurological  deficits. Extremities: no cyanosis or edema Psychiatry: Judgement and insight appear normal. Mood & affect appropriate.     Data Reviewed: I have personally reviewed following labs and imaging studies  CBC: Recent Labs  Lab 12/25/18 1623 12/26/18 0627  WBC 5.9 6.9  HGB 12.9 14.0  HCT 37.4 41.1  MCV 89.3 90.1  PLT 276 [id number]   Basic Metabolic Panel: Recent Labs  Lab 12/25/18 1623 12/26/18 0627  NA 139 140  K 4.0 4.0  CL 102 104  CO2 28 26  GLUCOSE 97 141*  BUN 15 16  CREATININE 0.72 0.63  CALCIUM 9.2 9.5   GFR: Estimated Creatinine Clearance: 58.7 mL/min (by C-G formula based on SCr of 0.63 mg/dL). Liver Function Tests: Recent Labs  Lab 12/25/18 1623  AST 55*  ALT 62*  ALKPHOS 98  BILITOT 0.4  PROT 7.4  ALBUMIN 4.3   No results for input(s): LIPASE, AMYLASE in the last 168 hours. No results for input(s): AMMONIA in the last 168 hours. Coagulation Profile: No results for input(s): INR, PROTIME in the last 168 hours. Cardiac Enzymes: No results for input(s): CKTOTAL, CKMB, CKMBINDEX, TROPONINI in the last 168 hours. BNP (last 3 results) No results for input(s): PROBNP in the last 8760 hours. HbA1C: Recent Labs    12/26/18 0627  HGBA1C 6.0*   CBG: No results for input(s): GLUCAP in the  last 168 hours. Lipid Profile: Recent Labs    12/26/18 0627  CHOL 199  HDL 62  LDLCALC 75  TRIG 312*  CHOLHDL 3.2   Thyroid Function Tests: Recent Labs    12/25/18 1623  TSH 1.806  FREET4 1.15*   Anemia Panel: No results for input(s): VITAMINB12, FOLATE, FERRITIN, TIBC, IRON, RETICCTPCT in the last 72 hours. Sepsis Labs: No results for input(s): PROCALCITON, LATICACIDVEN in the last 168 hours.  Recent Results (from the past 240 hour(s))  SARS CORONAVIRUS 2 (TAT 6-24 HRS) Nasopharyngeal Nasopharyngeal Swab     Status: None   Collection Time: 12/25/18 10:37 PM   Specimen: Nasopharyngeal Swab  Result Value Ref Range Status   SARS Coronavirus 2 NEGATIVE  NEGATIVE Final    Comment: (NOTE) SARS-CoV-2 target nucleic acids are NOT DETECTED. The SARS-CoV-2 RNA is generally detectable in upper and lower respiratory specimens during the acute phase of infection. Negative results do not preclude SARS-CoV-2 infection, do not rule out co-infections with other pathogens, and should not be used as the sole basis for treatment or other patient management decisions. Negative results must be combined with clinical observations, patient history, and epidemiological information. The expected result is Negative. Fact Sheet for Patients: SugarRoll.be Fact Sheet for Healthcare Providers: https://www.woods-mathews.com/ This test is not yet approved or cleared by the [place] FDA and  has been authorized for detection and/or diagnosis of SARS-CoV-2 by FDA under an Emergency Use Authorization (EUA). This EUA will remain  in effect (meaning this test can be used) for the duration of the COVID-19 declaration under Section 56 4(b)(1) of the Act, 21 U.S.C. section 360bbb-3(b)(1), unless the authorization is terminated or revoked sooner. Performed at [facility], [place] [address] [place], [place] [address]          Radiology Studies: CT Head Wo Contrast  Result Date: 12/25/2018 CLINICAL DATA:  Altered level of consciousness EXAM: CT HEAD WITHOUT CONTRAST TECHNIQUE: Contiguous axial images were obtained from the base of the skull through the vertex without intravenous contrast. COMPARISON:  07/11/2018 FINDINGS: Brain: Stable lacunar infarct within the head of the caudate nucleus on the right. No new hemorrhage, infarct or space-occupying mass lesion is seen. Vascular: No hyperdense vessel or unexpected calcification. Skull: Normal. Negative for fracture or focal lesion. Sinuses/Orbits: No acute finding. Other: None. IMPRESSION: Chronic changes without acute abnormality. Electronically Signed   By: Inez Catalina  M.D.   On: 12/25/2018 19:23        Scheduled Meds: . atorvastatin  20 mg Oral QHS  . cholecalciferol  1,000 Units Oral Daily  . enoxaparin (LOVENOX) injection  40 mg Subcutaneous Q24H  . ferrous sulfate  325 mg Oral Q breakfast  . ibuprofen  400 mg Oral Once  . levothyroxine  50 mcg Oral Q0600  . linaclotide  290 mcg Oral QAC breakfast  . losartan  50 mg Oral Once  . losartan  50 mg Oral Daily  . PARoxetine  20 mg Oral Daily  . sodium chloride flush  3 mL Intravenous Q12H  . umeclidinium bromide  1 puff Inhalation Daily   Continuous Infusions: . sodium chloride      Assessment & Plan:   Principal Problem:   Altered mental status, unspecified Active Problems:   History of TIA (transient ischemic attack)   Hypothyroidism   History of falling   MCI (mild cognitive impairment) with memory loss   Confusion state   Abnormal TSH   1.AMS- etiology unclear. Possibly 2/2  meds, TIA? Or likely underlying dementia. CT head negative No focal deficits currently Will obtain MRI Of carotid and echo  2.Hypothryoidsm-TSh within nml limits  3. HTN- continue current meds Will consult cards as pt demands this.  4. Social work Comptroller pts request   DVT prophylaxis: lovenox  Family Communication: none Disposition Plan: if all w/u negative will d/c in am       LOS: 0 days   Time spent: 45 min and more than 50% coc    Nolberto Hanlon, MD [facility] Pager [phone]  If 7PM-7AM, please contact night-coverage [email or web address] Password [facility] 12/26/2018, 3:57 PM
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
## 2018-12-26 NOTE — ED Notes (Signed)
ED TO INPATIENT HANDOFF REPORT  ED Nurse Name and Phone #: Joelene Millin [phone]  S Name/Age/Gender Angela [name] 68 y.o. female Room/Bed: ED01HA/ED01HA  Code Status   Code Status: Full Code  Home/SNF/Other Home Patient oriented to: self, place, time and situation Is this baseline? Yes   Triage Complete: Triage complete  Chief Complaint Altered Mental Status/Nausea  Triage Note Pt reports has been confused.  Pt keeps asking RN why her family has not called her.  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  Pt reports hx of low NA/K and thinks that is the problem. Disoriented to time, when asked month states "October" then asks RN if that is correct.  VSS. NAD. Unlabored.    Allergies Allergies  Allergen Reactions  . Advair Diskus [Fluticasone-Salmeterol] Shortness Of Breath  . Bee Venom Hives  . Darvon [Propoxyphene] Anaphylaxis and Hives    Hives/throat swelling   . Dicyclomine Itching  . Other Hives    Spider bites cause hives  . Oxycodone-Acetaminophen Nausea And Vomiting and Other (See Comments)    hallucinations   . Peanuts [Peanut Oil] Anaphylaxis and Hives  . Penicillins Hives    Has patient had a PCN reaction causing immediate rash, facial/tongue/throat swelling, SOB or lightheadedness with hypotension: Unknown Has patient had a PCN reaction causing severe rash involving mucus membranes or skin necrosis: Unknown Has patient had a PCN reaction that required hospitalization: Unknown Has patient had a PCN reaction occurring within the last 10 years: Unknown If all of the above answers are "NO", then may proceed with Cephalosporin use.  . Sulfa Antibiotics Anaphylaxis, Hives and Itching  . Sulfacetamide Sodium Anaphylaxis, Hives and Itching  . Ultram [Tramadol] Hives  . Amikacin Nausea And Vomiting  . Aspirin Hives  . Haloperidol Nausea And Vomiting  . Hymenoptera Venom  Preparations Hives    Reaction to spider bites  . Imipramine Hives    Reaction to Tofranil  . Keflex [Cephalexin] Hives  . Lactose   . Lactose Intolerance (Gi)   . Risperdal [Risperidone]     Serotonin syndrome  . Sulfur Hives  . Adhesive [Tape] Hives and Rash  . Albuterol Palpitations    Tolerates levalbuterol  . Ciprofloxacin Other (See Comments)    tendonopathy  . Hydroxyzine Anxiety and Itching  . Peanut Butter Flavor Anxiety  . Prednisone Nausea And Vomiting, Anxiety and Other (See Comments)    Crazy mood swings, strange thoughts. Tolerates depomedrol     Level of Care/Admitting Diagnosis ED Disposition    ED Disposition Condition Comment   Admit  Hospital Area: [place] [[id number]]  Level of Care: Med-Surg [16]  Covid Evaluation: Asymptomatic Screening Protocol (No Symptoms)  Diagnosis: Confusion state [id number]  Admitting Physician: Athena Masse [id number]  Attending Physician: Athena Masse [id number]  PT Class (Do Not Modify): Observation [104]  PT Acc Code (Do Not Modify): Observation [10022]       B Medical/Surgery History Past Medical History:  Diagnosis Date  . Allergic rhinitis   . Anemia   . Blurred vision   . Depression   . Diverticulosis   . Endometriosis   . Falls   . GERD (gastroesophageal reflux disease)   . Hernia, hiatal   . Hypothyroidism   . IBS (irritable bowel syndrome)   . Malignant neoplasm of skin   . Migraine   . Neuropathy   .  PTSD (post-traumatic stress disorder)   . Thyroid disease    Past Surgical History:  Procedure Laterality Date  . APPENDECTOMY    . CHOLECYSTECTOMY    . CHOLECYSTECTOMY, LAPAROSCOPIC    . ESOPHAGOGASTRODUODENOSCOPY (EGD) WITH PROPOFOL N/A 03/29/2015   Procedure: ESOPHAGOGASTRODUODENOSCOPY (EGD) WITH PROPOFOL;  Surgeon: Josefine Class, MD;  Location: [facility] ENDOSCOPY;  Service: Endoscopy;  Laterality: N/A;  . HERNIA REPAIR    . laproscopy    . TONSILLECTOMY    . uteral  suspension       A IV Location/Drains/Wounds Patient Lines/Drains/Airways Status   Active Line/Drains/Airways    Name:   Placement date:   Placement time:   Site:   Days:   Peripheral IV 12/26/18 Left;Posterior Forearm   12/26/18    0020    Forearm   less than 1          Intake/Output Last 24 hours No intake or output data in the 24 hours ending 12/26/18 0100  Labs/Imaging Results for orders placed or performed during the hospital encounter of 12/25/18 (from the past 48 hour(s))  Comprehensive metabolic panel     Status: Abnormal   Collection Time: 12/25/18  4:23 PM  Result Value Ref Range   Sodium 139 135 - 145 mmol/L   Potassium 4.0 3.5 - 5.1 mmol/L   Chloride 102 98 - 111 mmol/L   CO2 28 22 - 32 mmol/L   Glucose, Bld 97 70 - 99 mg/dL   BUN 15 8 - 23 mg/dL   Creatinine, Ser 0.72 0.44 - 1.00 mg/dL   Calcium 9.2 8.9 - 10.3 mg/dL   Total Protein 7.4 6.5 - 8.1 g/dL   Albumin 4.3 3.5 - 5.0 g/dL   AST 55 (H) 15 - 41 U/L   ALT 62 (H) 0 - 44 U/L   Alkaline Phosphatase 98 38 - 126 U/L   Total Bilirubin 0.4 0.3 - 1.2 mg/dL   GFR calc non Af Amer >60 >60 mL/min   GFR calc Af Amer >60 >60 mL/min   Anion gap 9 5 - 15    Comment: Performed at [facility], [address] [place], [place] [address]  CBC     Status: None   Collection Time: 12/25/18  4:23 PM  Result Value Ref Range   WBC 5.9 4.0 - 10.5 K/uL   RBC 4.19 3.87 - 5.11 MIL/uL   Hemoglobin 12.9 12.0 - 15.0 g/dL   HCT 37.4 36.0 - 46.0 %   MCV 89.3 80.0 - 100.0 fL   MCH 30.8 26.0 - 34.0 pg   MCHC 34.5 30.0 - 36.0 g/dL   RDW 14.1 11.5 - 15.5 %   Platelets 276 150 - 400 K/uL   nRBC 0.0 0.0 - 0.2 %    Comment: Performed at [facility], [address] [place], [place] [address]  TSH     Status: None   Collection Time: 12/25/18  4:23 PM  Result Value Ref Range   TSH 1.806 0.350 - 4.500 uIU/mL    Comment: Performed by a 3rd Generation assay with a functional sensitivity of <=0.01 uIU/mL. Performed  at [facility], [address] [place], [place] [address]   T4, free     Status: Abnormal   Collection Time: 12/25/18  4:23 PM  Result Value Ref Range   Free T4 1.15 (H) 0.61 - 1.12 ng/dL    Comment: (NOTE) Biotin ingestion may interfere with free T4 tests. If the results are inconsistent  with the TSH level, previous test results, or the clinical presentation, then consider biotin interference. If needed, order repeat testing after stopping biotin. Performed at [facility], [place], [place] [address]   Troponin I (High Sensitivity)     Status: None   Collection Time: 12/25/18  4:23 PM  Result Value Ref Range   Troponin I (High Sensitivity) 3 <18 ng/L    Comment: (NOTE) Elevated high sensitivity troponin I (hsTnI) values and significant  changes across serial measurements may suggest ACS but many other  chronic and acute conditions are known to elevate hsTnI results.  Refer to the "Links" section for chest pain algorithms and additional  guidance. Performed at [facility], [address] [place], [place] [address]   Urinalysis, Complete w Microscopic     Status: Abnormal   Collection Time: 12/25/18  8:54 PM  Result Value Ref Range   Color, Urine COLORLESS (A) YELLOW   APPearance CLEAR (A) CLEAR   Specific Gravity, Urine 1.005 1.005 - 1.030   pH 8.0 5.0 - 8.0   Glucose, UA NEGATIVE NEGATIVE mg/dL   Hgb urine dipstick NEGATIVE NEGATIVE   Bilirubin Urine NEGATIVE NEGATIVE   Ketones, ur NEGATIVE NEGATIVE mg/dL   Protein, ur NEGATIVE NEGATIVE mg/dL   Nitrite NEGATIVE NEGATIVE   Leukocytes,Ua NEGATIVE NEGATIVE   RBC / HPF 0-5 0 - 5 RBC/hpf   WBC, UA 0-5 0 - 5 WBC/hpf   Bacteria, UA NONE SEEN NONE SEEN   Squamous Epithelial / LPF 0-5 0 - 5    Comment: Performed at [facility], [address] [place], [place] [address]  Urine Drug Screen, Qualitative     Status: None   Collection Time: 12/25/18  8:54 PM  Result  Value Ref Range   Tricyclic, Ur Screen NONE DETECTED NONE DETECTED   Amphetamines, Ur Screen NONE DETECTED NONE DETECTED   MDMA (Ecstasy)Ur Screen NONE DETECTED NONE DETECTED   Cocaine Metabolite,Ur [place] NONE DETECTED NONE DETECTED   Opiate, Ur Screen NONE DETECTED NONE DETECTED   Phencyclidine (PCP) Ur S NONE DETECTED NONE DETECTED   Cannabinoid 50 Ng, Ur [place] NONE DETECTED NONE DETECTED   Barbiturates, Ur Screen NONE DETECTED NONE DETECTED   Benzodiazepine, Ur Scrn NONE DETECTED NONE DETECTED   Methadone Scn, Ur NONE DETECTED NONE DETECTED    Comment: (NOTE) Tricyclics + metabolites, urine    Cutoff 1000 ng/mL Amphetamines + metabolites, urine  Cutoff 1000 ng/mL MDMA (Ecstasy), urine              Cutoff 500 ng/mL Cocaine Metabolite, urine          Cutoff 300 ng/mL Opiate + metabolites, urine        Cutoff 300 ng/mL Phencyclidine (PCP), urine         Cutoff 25 ng/mL Cannabinoid, urine                 Cutoff 50 ng/mL Barbiturates + metabolites, urine  Cutoff 200 ng/mL Benzodiazepine, urine              Cutoff 200 ng/mL Methadone, urine                   Cutoff 300 ng/mL The urine drug screen provides only a preliminary, unconfirmed analytical test result and should not be used for non-medical purposes. Clinical consideration and professional judgment should be applied to any positive drug screen result due to possible interfering substances. A more specific alternate chemical method  must be used in order to obtain a confirmed analytical result. Gas chromatography / mass spectrometry (GC/MS) is the preferred confirmat ory method. Performed at [facility], [place], [place] [address]    Ct Head Wo Contrast  Result Date: 12/25/2018 CLINICAL DATA:  Altered level of consciousness EXAM: CT HEAD WITHOUT CONTRAST TECHNIQUE: Contiguous axial images were obtained from the base of the skull through the vertex without intravenous contrast. COMPARISON:  07/11/2018  FINDINGS: Brain: Stable lacunar infarct within the head of the caudate nucleus on the right. No new hemorrhage, infarct or space-occupying mass lesion is seen. Vascular: No hyperdense vessel or unexpected calcification. Skull: Normal. Negative for fracture or focal lesion. Sinuses/Orbits: No acute finding. Other: None. IMPRESSION: Chronic changes without acute abnormality. Electronically Signed   By: Inez Catalina M.D.   On: 12/25/2018 19:23    Pending Labs Unresulted Labs (From admission, onward)    Start     Ordered   01/01/19 0500  Creatinine, serum  (enoxaparin (LOVENOX)    CrCl >/= 30 ml/min)  Weekly,   STAT    Comments: while on enoxaparin therapy    12/25/18 2152   12/26/18 [id number]  Basic metabolic panel  Tomorrow morning,   STAT     12/25/18 2152   12/26/18 0500  CBC  Tomorrow morning,   STAT     12/25/18 2152   12/26/18 0500  Hemoglobin A1c  Tomorrow morning,   STAT     12/25/18 2152   12/26/18 0500  Lipid panel  Tomorrow morning,   STAT    Comments: Fasting    12/25/18 2152   12/25/18 2144  HIV Antibody (routine testing w rflx)  (HIV Antibody (Routine testing w reflex) panel)  Once,   STAT     12/25/18 2152   12/25/18 1946  SARS CORONAVIRUS 2 (TAT 6-24 HRS) Nasopharyngeal Nasopharyngeal Swab  (Asymptomatic/Tier 3)  Once,   STAT    Question Answer Comment  Is this test for diagnosis or screening Screening   Symptomatic for COVID-19 as defined by CDC No   Hospitalized for COVID-19 No   Admitted to ICU for COVID-19 No   Previously tested for COVID-19 Yes   Resident in a congregate (group) care setting No   Employed in healthcare setting No   Pregnant No      12/25/18 1945          Vitals/Pain Today's Vitals   12/25/18 1634 12/25/18 1903 12/25/18 2056 12/25/18 2221  BP:  (!) 168/77 (!) 188/85 129/90  Pulse:  60 (!) 58 62  Resp:  18 15 17   Temp:      TempSrc:      SpO2:  98% 98% 99%  Weight:      Height:      PainSc: 0-No pain       Isolation Precautions No  active isolations  Medications Medications  losartan (COZAAR) tablet 50 mg (50 mg Oral Not Given 12/25/18 2220)  acetaminophen (TYLENOL) tablet 650 mg (has no administration in time range)  atorvastatin (LIPITOR) tablet 20 mg (20 mg Oral Given 12/25/18 2220)  icosapent Ethyl (VASCEPA) 1 g capsule 1 g ( Oral Canceled Entry 12/25/18 2224)  losartan (COZAAR) tablet 50 mg (50 mg Oral Not Given 12/25/18 2310)  PARoxetine (PAXIL) tablet 20 mg (has no administration in time range)  traZODone (DESYREL) tablet 50 mg (has no administration in time range)  LORazepam (ATIVAN) tablet 0.5 mg (has no administration in time range)  levothyroxine (SYNTHROID) tablet 50 mcg (has no administration in time range)  famotidine (PEPCID) tablet 20 mg (has no administration in time range)  linaclotide (LINZESS) capsule 290 mcg (has no administration in time range)  ondansetron (ZOFRAN-ODT) disintegrating tablet 4 mg (has no administration in time range)  ferrous sulfate tablet 325 mg (has no administration in time range)  cholecalciferol (VITAMIN D3) tablet 1,000 Units (has no administration in time range)  levalbuterol (XOPENEX) nebulizer solution 0.63 mg (has no administration in time range)  umeclidinium bromide (INCRUSE ELLIPTA) 62.5 MCG/INH 1 puff ( Inhalation Canceled Entry 12/25/18 2225)  sodium chloride flush (NS) 0.9 % injection 3 mL (has no administration in time range)  sodium chloride flush (NS) 0.9 % injection 3 mL (has no administration in time range)  0.9 %  sodium chloride infusion (has no administration in time range)  senna-docusate (Senokot-S) tablet 1 tablet (has no administration in time range)   stroke: mapping our early stages of recovery book (has no administration in time range)  enoxaparin (LOVENOX) injection 40 mg (40 mg Subcutaneous Given 12/25/18 2220)    Mobility walks with device High fall risk   Focused Assessments Neuro Assessment Handoff:  Swallow screen pass? Yes  Cardiac  Rhythm: Sinus bradycardia NIH Stroke Scale ( + Modified Stroke Scale Criteria)  Interval: Initial Level of Consciousness (1a.)   : Alert, keenly responsive LOC Questions (1b. )   +: Answers both questions correctly LOC Commands (1c. )   + : Performs both tasks correctly Best Gaze (2. )  +: Normal Visual (3. )  +: No visual loss Facial Palsy (4. )    : Normal symmetrical movements Motor Arm, Left (5a. )   +: No drift Motor Arm, Right (5b. )   +: No drift Motor Leg, Left (6a. )   +: No drift Motor Leg, Right (6b. )   +: No drift Limb Ataxia (7. ): Absent Sensory (8. )   +: Normal, no sensory loss Best Language (9. )   +: No aphasia Dysarthria (10. ): Normal Extinction/Inattention (11.)   +: No Abnormality Modified SS Total  +: 0 Complete NIHSS TOTAL: 0     Neuro Assessment: Exceptions to WDL Neuro Checks:   Initial (12/25/18 2239)  Last Documented NIHSS Modified Score: 0 (12/25/18 2239) Has TPA been given? No If patient is a Neuro Trauma and patient is going to OR before floor call report to [place] nurse: [phone] or [phone]     R Recommendations: See Admitting Provider Note  Report given to:   Additional Notes:

## 2018-12-26 NOTE — Telephone Encounter (Signed)
Phone note routed to Dr. Rockey Situ. Secure chat also sent to MD to review this message.

## 2018-12-27 ENCOUNTER — Observation Stay: Payer: Medicare Other

## 2018-12-27 DIAGNOSIS — R410 Disorientation, unspecified: Secondary | ICD-10-CM | POA: Diagnosis not present

## 2018-12-27 DIAGNOSIS — F4489 Other dissociative and conversion disorders: Secondary | ICD-10-CM

## 2018-12-27 DIAGNOSIS — Z8673 Personal history of transient ischemic attack (TIA), and cerebral infarction without residual deficits: Secondary | ICD-10-CM | POA: Diagnosis not present

## 2018-12-27 DIAGNOSIS — F319 Bipolar disorder, unspecified: Secondary | ICD-10-CM | POA: Diagnosis not present

## 2018-12-27 DIAGNOSIS — R4182 Altered mental status, unspecified: Secondary | ICD-10-CM | POA: Diagnosis not present

## 2018-12-27 DIAGNOSIS — E039 Hypothyroidism, unspecified: Secondary | ICD-10-CM | POA: Diagnosis not present

## 2018-12-27 MED ORDER — LEVOTHYROXINE SODIUM 50 MCG PO TABS: 50.0000 ug | ORAL_TABLET | Freq: Every day | ORAL | 0 refills | Status: DC

## 2018-12-27 NOTE — Care Management Obs Status (Signed)
[facility] NOTIFICATION   Patient Details  Name: Angela [name] MRN: [id number] Date of Birth: 1950-09-27   Medicare Observation Status Notification Given:  Yes(CSW working remotely. Printed off on unit printer and asked RN to give to her.)    Candie Chroman, LCSW 12/27/2018, 8:47 AM

## 2018-12-27 NOTE — Telephone Encounter (Signed)
Patient calling to discuss issues with nurse .  Declined to give details other than " I have been calling and she knows".

## 2018-12-27 NOTE — Progress Notes (Signed)
OT Cancellation Note  Patient Details Name: Angela [name] MRN: [id number] DOB: 19-Jun-1950   Cancelled Treatment:    Reason Eval/Treat Not Completed: Other (comment). Order received and chart reviewed. OT attempted to see pt for evaluation this am. At start of OT evaluation, MD Amery in room to inform pt she would be discharging. Pt responds "No I am not, and I am firing you." MD informs pt that she has no medical necessity to remain in the hospital but that the pt can appeal the decision if desired. Pt requests to see her cardiologist. MD informs pt that she attempted to have her cardiologist on record come to the hospital but staff at the practice indicated the pt had been "fired from the practice". Pt vehemently disagrees. MD asks if pt would like her to continue with her am assessment. Pt agreeable to Dr. Lupita Dawn assessment and requests that the doctor "check her stomach". MD completes assessment and leaves the room. Pt states "this is Bull sh--, that wasn't even an assessment" after MD leaves room. Pt requesting time to call her cardiologist. OT informed pt she would return later this AM. Will re-attempt OT evaluation later this date.   Shara Blazing, M.S., OTR/L Ascom: [phone] 12/27/18, 9:04 AM

## 2018-12-27 NOTE — Evaluation (Addendum)
Occupational Therapy Evaluation Patient Details Name: Angela [name] MRN: [id number] DOB: [birth year] Today's Date: 12/27/2018    History of Present Illness 68 y.o. female with medical history significant hypothyroidism and depression as well as a history of TIA who presents to the emergency room concerns for confusion.  Was advised to come to the emergency room by a friend who was seated at the bedside who noted that she appeared confused.    Clinical Impression   Angela [name] was seen for OT evaluation this date. Pt received semi-supine in bed with breakfast tray. Pt endorses significant dissatisfaction with her experience at [facility], and states that she is deeply upset that her doctor told her she would be discharging despite her significant stomach pain and the fact that she "just had a stroke". Of note: imaging negative for acute infarct as of the time of this note. Pt intermittently tearful and agitated t/o OT evaluation. OT attempts to use therapeutic use of self to provide emotional support, encouragement, and build therapeutic rapport. Pt difficult to re-direct to tasks. Charge nurse notified of pt complaints regarding care. At one point during the evaluation Angela [name] insists this therapist be present while she calls her cardiologists office to determine why she'd been "discharged from their services". Pt often tangential in conversation at various points in the evaluation stating "Jesus told me he was going to shut down all the churches in [place]" and "I just know I have peritonitis, I feel like I'm on fire". See below for additional detail on pt cognition and behavior throughout this evaluation. Pt demonstrates baseline independence to perform ADL and mobility tasks. Pt dons tennis shoes independently and stands/ambulates without assistance this date. She demonstrates independent self-feeding abilities and does not appear to need acute OT services at this time. Pt requests that this  therapist "soak" her hands and expresses interest in physical agent modalities "PAMs" to address her numbness and BUE pain. This therapist explains that this would be best addressed in an out-patient/clinic setting as pt does not have functional limitations to perform basic activities of daily living. Recommend pt seek outpatient Occupational Therapy if her BUE pain/numbness continues and/or functionally impacts her ability to perform self-care skills/meanignful occupations of daily life. All education/assessment completed at time of evaluation. OT will sign off at this time. Please re-consult if additional OT needs arise during this admission.       Follow Up Recommendations  Outpatient OT;Other (comment)(Pt exresses interest in PAMs to address BUE pain and numbness.)    Equipment Recommendations  None recommended by OT    Recommendations for Other Services       Precautions / Restrictions Precautions Precautions: Fall Restrictions Weight Bearing Restrictions: No      Mobility Bed Mobility Overal bed mobility: Independent             General bed mobility comments: Pt able to get to EOB to don shoes w/o assist.  Transfers Overall transfer level: Independent Equipment used: None             General transfer comment: Pt stands from EOB before therapist can bring her 4ww to her reach. Pt ambulates to room recliner and sits w/ good confidence. No LOB noted with amb. this date. Pt also stands to dance briefly in front of room recliner after this therapist remarks that she was moving well this date.    Balance Overall balance assessment: No apparent balance deficits (not formally assessed)  ADL either performed or assessed with clinical judgement   ADL Overall ADL's : At baseline                                       General ADL Comments: Pt presents at baseline for functional ADL performance this  date. Completes bed mobility independently, sits at EOB to don tennis shoes. Pt dons and ties shoes independently with good coordination and no observable difficulty. Pt observed to complete self-feeding independently. Ambulates to room recliner without an AD and at one point stands and dances briefly in front of room recliner after this therapist states "You seem to be moving well today". Unclear why pt chose to stand and dance as she had just endorsed significant stomach pain, however, pt demonstrates good balance and coordination with this activity.     Vision         Perception     Praxis      Pertinent Vitals/Pain Pain Assessment: 0-10 Pain Score: 10-Worst pain ever Pain Location: Stomach Pain Descriptors / Indicators: Burning Pain Intervention(s): Limited activity within patient's tolerance;Monitored during session;Patient requesting pain meds-RN notified     Hand Dominance Right   Extremity/Trunk Assessment Upper Extremity Assessment Upper Extremity Assessment: Overall WFL for tasks assessed(Pt reports 1-year hx of BUE numbness and pain. She endorses that she has been seen at [facility] and at [facility] for "my spine". BUE FMC and ROM WFL this date. Strength not assessed 2/2 pt level of agitation during session.)   Lower Extremity Assessment Lower Extremity Assessment: Overall WFL for tasks assessed;Defer to PT evaluation       Communication Communication Communication: No difficulties   Cognition Arousal/Alertness: Awake/alert Behavior During Therapy: Restless;Agitated Overall Cognitive Status: No family/caregiver present to determine baseline cognitive functioning                                 General Comments: Unsure of pt's true baseline, she was aware to self, place, and situation, able to maintain appropriate conversation though at times tangential and needing redirection, also showing some impulsivity and/or questionable safety decisions. She reports feeling  "space cadet" and "like my head is full of air". And reports significant dissatisfaction with her medical care since admission. Pt intermittently tearful and yelling during OT evaluation. At one point pt states "Jesus told me he was going to shut down all the churches in [place]".   General Comments  Pt endorses significant dissatisfaction with hospital staff as well as her medical team. During this OT assessment pt insists on calling her cardiologists office to "see why she had been discharged" and states she would like this author there for "leverage". This P[employer]or Curia informs the pt she has no leverage or influence over the cardiologist's office. Clinic staff is unable to connect pt with her requested party and ask pt to take a message, pt expresses that she has been calling for 2 weeks now and always gets the same answer.    Exercises Other Exercises Other Exercises: Pt education lmited 2/2 pt agitation during evaluation. OT attempts to provide safety education however pt consistently interrupts and directs conversation back to her complaints about this hospital.   Shoulder Instructions      Home Living Family/patient expects to be discharged to:: Private residence Living Arrangements: Alone Available Help at Discharge: Friend(s);Available PRN/intermittently Type of Home:  Apartment Home Access: Stairs to enter Entrance Stairs-Number of Steps: 4 Entrance Stairs-Rails: None                 Home Equipment: Environmental consultant - 4 wheels      Lives With: Alone    Prior Functioning/Environment Level of Independence: Independent with assistive device(s)        Comments: Pt's friend drives her or she takes public transport, explicit about lack of assist from and some conflict with her landlord and housing issues        OT Problem List: Decreased cognition;Decreased safety awareness;Decreased knowledge of use of DME or AE      OT Treatment/Interventions:      OT Goals(Current goals can be  found in the care plan section) Acute Rehab OT Goals Patient Stated Goal: pt planning on going home but states she wants to move out as soon as possible OT Goal Formulation: All assessment and education complete, DC therapy Time For Goal Achievement: 12/27/18 Potential to Achieve Goals: Good  OT Frequency:     Barriers to D/C:            Co-evaluation              AM-PAC OT "6 Clicks" Daily Activity     Outcome Measure Help from another person eating meals?: None Help from another person taking care of personal grooming?: None Help from another person toileting, which includes using toliet, bedpan, or urinal?: None Help from another person bathing (including washing, rinsing, drying)?: None Help from another person to put on and taking off regular upper body clothing?: None Help from another person to put on and taking off regular lower body clothing?: None 6 Click Score: 24   End of Session Nurse Communication: Other (comment);Patient requests pain meds(Pt requesting her morning vitals be taken.)  Activity Tolerance: Treatment limited secondary to agitation Patient left: in chair;with call bell/phone within reach;with chair alarm set  OT Visit Diagnosis: Other symptoms and signs involving cognitive function                Time: [id number] OT Time Calculation (min): 42 min Charges:  OT General Charges $OT Visit: 1 Visit OT Evaluation $OT Eval Moderate Complexity: 1 Mod  Shara Blazing, M.S., OTR/L Ascom: [phone] 12/27/18, 12:35 PM

## 2018-12-27 NOTE — Discharge Summary (Signed)
Angela [name] [id number] DOB: 10/23/1950 DOA: 12/25/2018  PCP: Crecencio Mc, MD  Admit date: 12/25/2018 Discharge date: 12/27/2018  Admitted From: home Disposition:  home  Recommendations for Outpatient Follow-up:  1. Follow up with PCP in 1 week 2. Please obtain BMP/CBC in one week 3. Please follow up on the following pending results:none  Home Health: Yes, PT, OT, social work   Discharge Condition:Stable CODE STATUS: Full Diet recommendation: Heart Healthy Brief/Interim Summary: Cleva Parkison Fletcheris a 68 y.o.femalewith medical history significanthypothyroidism and depressionas well as a history of TIA who presents to the emergency room concerns for confusion. Was advised to come to the emergency room by a friend who was seated at the bedside who noted that she appeared confused.CT head negative for acute abnormality.  She was placed on stroke protocol.  She had an echocardiogram, ultrasound of carotids, and MRI of brain with results below.  She demanded to see her cardiologist however her cardiologist had fired her from their office due to harassment.  Patient was very angry this a.m. that she was being discharged refusing to leave.  Explanation was given to her that all her exams came out unremarkable that she will need to follow-up with her primary care to go over her medications as one of them may be causing her confusion.  She did not have a stroke.  PT OT has seen the patient and recommended home health.  She was set up for home health.  Patient still very angry and does not want to be discharged and explained to her that we have no reason to keep her in-house as all her test came back without any acute abnormalities..    Discharge Diagnoses:  Principal Problem:   Altered mental status, unspecified Active Problems:   History of TIA (transient ischemic attack)   Hypothyroidism   History of falling   MCI (mild cognitive impairment) with memory loss   Confusion  state   Abnormal TSH    Discharge Instructions  Discharge Instructions    Diet - low sodium heart healthy   Complete by: As directed    Increase activity slowly   Complete by: As directed      Allergies as of 12/27/2018      Reactions   Advair Diskus [fluticasone-salmeterol] Shortness Of Breath   Bee Venom Hives   Darvon [propoxyphene] Anaphylaxis, Hives   Hives/throat swelling   Dicyclomine Itching   Other Hives   Spider bites cause hives   Oxycodone-acetaminophen Nausea And Vomiting, Other (See Comments)   hallucinations   Peanuts [peanut Oil] Anaphylaxis, Hives   Penicillins Hives   Has patient had a PCN reaction causing immediate rash, facial/tongue/throat swelling, SOB or lightheadedness with hypotension: Unknown Has patient had a PCN reaction causing severe rash involving mucus membranes or skin necrosis: Unknown Has patient had a PCN reaction that required hospitalization: Unknown Has patient had a PCN reaction occurring within the last 10 years: Unknown If all of the above answers are "NO", then may proceed with Cephalosporin use.   Sulfa Antibiotics Anaphylaxis, Hives, Itching   Sulfacetamide Sodium Anaphylaxis, Hives, Itching   Ultram [tramadol] Hives   Amikacin Nausea And Vomiting   Aspirin Hives   Haloperidol Nausea And Vomiting   Hymenoptera Venom Preparations Hives   Reaction to spider bites   Imipramine Hives   Reaction to Tofranil   Keflex [cephalexin] Hives   Lactose    Lactose Intolerance (gi)    Risperdal [risperidone]    Serotonin syndrome  Sulfur Hives   Adhesive [tape] Hives, Rash   Albuterol Palpitations   Tolerates levalbuterol   Ciprofloxacin Other (See Comments)   tendonopathy   Hydroxyzine Anxiety, Itching   Peanut Butter Flavor Anxiety   Prednisone Nausea And Vomiting, Anxiety, Other (See Comments)   Crazy mood swings, strange thoughts. Tolerates depomedrol       Medication List    STOP taking these medications   azithromycin  250 MG tablet Commonly known as: Zithromax   doxycycline 100 MG tablet Commonly known as: VIBRA-TABS   fluconazole 150 MG tablet Commonly known as: DIFLUCAN     TAKE these medications   acetaminophen 650 MG CR tablet Commonly known as: TYLENOL Take 650 mg by mouth every 8 (eight) hours as needed for pain.   albuterol 108 (90 Base) MCG/ACT inhaler Commonly known as: VENTOLIN HFA Inhale 2 puffs into the lungs every 4 (four) hours as needed for wheezing or shortness of breath.   atorvastatin 20 MG tablet Commonly known as: LIPITOR Take 1 tablet (20 mg total) by mouth at bedtime.   Biotin 10000 MCG Tabs Take 10,000 mcg by mouth daily.   Cholecalciferol 25 MCG (1000 UT) tablet Take 1 tablet (1,000 Units total) by mouth daily.   CITRACAL PO Take 800 mg by mouth. Takes 2 (400mg ) tablets BID   diclofenac sodium 1 % Gel Commonly known as: VOLTAREN Apply small amount (2 grams) topically over knees or hands up to four times a day if needed for arthritis   Estring 2 MG vaginal ring Generic drug: estradiol Place 2 mg vaginally every 3 (three) months. follow package directions   famotidine 20 MG tablet Commonly known as: PEPCID Take 1 tablet (20 mg total) by mouth 2 (two) times daily as needed for heartburn or indigestion.   feeding supplement (PRO-STAT 64) Liqd Take 30 mLs by mouth daily.   ferrous sulfate 325 (65 FE) MG tablet Take 1 tablet (325 mg total) by mouth daily with breakfast.   fluticasone 50 MCG/ACT nasal spray Commonly known as: FLONASE Place 2 sprays into both nostrils daily.   gabapentin 100 MG capsule Commonly known as: NEURONTIN Take 100 mg by mouth 2 (two) times daily.   hydrOXYzine 10 MG tablet Commonly known as: ATARAX/VISTARIL TAKE 1 TO 2 TABLETS BY MOUTH ONCE DAILY AS NEEDED   Incruse Ellipta 62.5 MCG/INH Aepb Generic drug: umeclidinium bromide Inhale 1 puff into the lungs daily.   Lactulose 20 GM/30ML Soln 30 ml every 4 hours until  constipation is relieved   levalbuterol 0.63 MG/3ML nebulizer solution Commonly known as: XOPENEX Take 3 mLs (0.63 mg total) by nebulization every 4 (four) hours as needed for wheezing or shortness of breath.   levothyroxine 50 MCG tablet Commonly known as: SYNTHROID Take 1 tablet (50 mcg total) by mouth daily at 6 (six) AM. Start taking on: December 28, 2018 What changed:   medication strength  how much to take  how to take this  when to take this  additional instructions   linaclotide 290 MCG Caps capsule Commonly known as: Linzess Take 1 capsule (290 mcg total) by mouth daily before breakfast.   LORazepam 0.5 MG tablet Commonly known as: ATIVAN TAKE 1 TABLET BY MOUTH AS NEEDED ONCE DAILY   losartan 50 MG tablet Commonly known as: COZAAR Take 1 tablet (50 mg total) by mouth daily.   Magnesium 500 MG Tabs Take 500 mg by mouth every other day.   multivitamin with minerals Tabs tablet Take 1 tablet by  mouth daily.   NAC 500 MG Caps Generic drug: Acetylcysteine Take 500 mg by mouth 2 (two) times a day.   ondansetron 4 MG disintegrating tablet Commonly known as: Zofran ODT Allow 1-2 tablets to dissolve in your mouth every 8 hours as needed for nausea/vomiting   PARoxetine 20 MG tablet Commonly known as: PAXIL Take 20 mg by mouth daily.   prednisoLONE acetate 1 % ophthalmic suspension Commonly known as: PRED FORTE INSTILL 1 DROP INTO EACH EYE TWICE DAILY   pregabalin 75 MG capsule Commonly known as: LYRICA Take 75 mg by mouth 2 (two) times daily.   Premarin vaginal cream Generic drug: conjugated estrogens Place 1 Applicatorful vaginally 2 (two) times a week.   pyridoxine 100 MG tablet Commonly known as: B-6 Take 1 tablet (100 mg total) by mouth 2 (two) times daily.   tiZANidine 2 MG tablet Commonly known as: ZANAFLEX Take by mouth every 6 (six) hours as needed for muscle spasms.   traZODone 50 MG tablet Commonly known as: DESYREL Take 1 tablet (50  mg total) by mouth at bedtime as needed. Pt takes 1/2 tablet qhs   triamcinolone ointment 0.5 % Commonly known as: KENALOG Apply 1 application topically 2 (two) times daily.   Vascepa 1 g capsule Generic drug: icosapent Ethyl TAKE 2 CAPSULES BY MOUTH TWICE DAILY (TO HELP WITH TRIGLYCERIDES)   Vitamin B-12 1000 MCG Subl Place 1,000 mcg under the tongue.   Xiidra 5 % Soln Generic drug: Lifitegrast INSTILL 1 DROP INTO EACH EYE TWICE DAILY      Follow-up Information    Crecencio Mc, MD Follow up in 7 day(s).   Specialty: Internal Medicine Why: needs reveiwe medications Contact information: [address] [place] [address] [phone]          Allergies  Allergen Reactions  . Advair Diskus [Fluticasone-Salmeterol] Shortness Of Breath  . Bee Venom Hives  . Darvon [Propoxyphene] Anaphylaxis and Hives    Hives/throat swelling   . Dicyclomine Itching  . Other Hives    Spider bites cause hives  . Oxycodone-Acetaminophen Nausea And Vomiting and Other (See Comments)    hallucinations   . Peanuts [Peanut Oil] Anaphylaxis and Hives  . Penicillins Hives    Has patient had a PCN reaction causing immediate rash, facial/tongue/throat swelling, SOB or lightheadedness with hypotension: Unknown Has patient had a PCN reaction causing severe rash involving mucus membranes or skin necrosis: Unknown Has patient had a PCN reaction that required hospitalization: Unknown Has patient had a PCN reaction occurring within the last 10 years: Unknown If all of the above answers are "NO", then may proceed with Cephalosporin use.  . Sulfa Antibiotics Anaphylaxis, Hives and Itching  . Sulfacetamide Sodium Anaphylaxis, Hives and Itching  . Ultram [Tramadol] Hives  . Amikacin Nausea And Vomiting  . Aspirin Hives  . Haloperidol Nausea And Vomiting  . Hymenoptera Venom Preparations Hives    Reaction to spider bites  . Imipramine Hives    Reaction to Tofranil  . Keflex  [Cephalexin] Hives  . Lactose   . Lactose Intolerance (Gi)   . Risperdal [Risperidone]     Serotonin syndrome  . Sulfur Hives  . Adhesive [Tape] Hives and Rash  . Albuterol Palpitations    Tolerates levalbuterol  . Ciprofloxacin Other (See Comments)    tendonopathy  . Hydroxyzine Anxiety and Itching  . Peanut Butter Flavor Anxiety  . Prednisone Nausea And Vomiting, Anxiety and Other (See Comments)    Crazy mood  swings, strange thoughts. Tolerates depomedrol     Consultations:     Procedures/Studies: DG Chest 2 View  Result Date: 12/04/2018 CLINICAL DATA:  Chest pain and nausea. EXAM: CHEST - 2 VIEW COMPARISON:  Radiograph in CT 07/11/2018 FINDINGS: Heart is normal in size. Moderate hiatal hernia. The lungs are clear. Pulmonary vasculature is normal. No consolidation, pleural effusion, or pneumothorax. No acute osseous abnormalities are seen. Stable anterior wedging of midthoracic vertebra. IMPRESSION: 1. No acute abnormality. 2. Moderate hiatal hernia. Electronically Signed   By: Keith Rake M.D.   On: 12/04/2018 03:17   CT Head Wo Contrast  Result Date: 12/25/2018 CLINICAL DATA:  Altered level of consciousness EXAM: CT HEAD WITHOUT CONTRAST TECHNIQUE: Contiguous axial images were obtained from the base of the skull through the vertex without intravenous contrast. COMPARISON:  07/11/2018 FINDINGS: Brain: Stable lacunar infarct within the head of the caudate nucleus on the right. No new hemorrhage, infarct or space-occupying mass lesion is seen. Vascular: No hyperdense vessel or unexpected calcification. Skull: Normal. Negative for fracture or focal lesion. Sinuses/Orbits: No acute finding. Other: None. IMPRESSION: Chronic changes without acute abnormality. Electronically Signed   By: Inez Catalina M.D.   On: 12/25/2018 19:23   MR BRAIN WO CONTRAST  Result Date: 12/27/2018 CLINICAL DATA:  Delirium, dizziness, confusion. Additional history provided: Intermittent episodes of  confusion and altered mental status. EXAM: MRI HEAD WITHOUT CONTRAST TECHNIQUE: Multiplanar, multiecho pulse sequences of the brain and surrounding structures were obtained without intravenous contrast. COMPARISON:  Head CT 12/25/2018, brain MRI 04/21/2015 FINDINGS: Brain: There is no evidence of acute infarct. No evidence of intracranial mass. No midline shift or extra-axial fluid collection. Chronic microhemorrhage within the right parietal lobe. Mild scattered T2/FLAIR hyperintensity within the cerebral white matter is nonspecific, but consistent with chronic small vessel ischemic disease. Redemonstrated chronic lacunar infarct within the right caudate head. Mild generalized parenchymal atrophy. Vascular: Flow voids maintained within the proximal large arterial vessels. Skull and upper cervical spine: No focal marrow lesion. Sinuses/Orbits: Visualized orbits demonstrate no acute abnormality. No significant paranasal sinus disease or mastoid effusion. IMPRESSION: 1. No evidence of acute intracranial abnormality, including acute infarct. 2. Mild generalized parenchymal atrophy and chronic small vessel ischemic disease. Redemonstrated chronic right basal ganglia lacunar infarct. Electronically Signed   By: Kellie Simmering DO   On: 12/27/2018 08:14   US Carotid Bilateral (at [facility] and [facility] only)  Result Date: 12/26/2018 CLINICAL DATA:  68 year old female with a history of TIA EXAM: BILATERAL CAROTID DUPLEX ULTRASOUND TECHNIQUE: Pearline Cables scale imaging, color Doppler and duplex ultrasound were performed of bilateral carotid and vertebral arteries in the neck. COMPARISON:  None. FINDINGS: Criteria: Quantification of carotid stenosis is based on velocity parameters that correlate the residual internal carotid diameter with NASCET-based stenosis levels, using the diameter of the distal internal carotid lumen as the denominator for stenosis measurement. The following velocity measurements were obtained: RIGHT ICA:  Systolic 76  cm/sec, Diastolic 23 cm/sec CCA:  81 cm/sec SYSTOLIC ICA/CCA RATIO:  0.9 ECA:  50 cm/sec LEFT ICA:  Systolic 89 cm/sec, Diastolic 26 cm/sec CCA:  87 cm/sec SYSTOLIC ICA/CCA RATIO:  1.0 ECA:  59 cm/sec Right Brachial SBP: Not acquired Left Brachial SBP: Not acquired RIGHT CAROTID ARTERY: No significant calcified disease of the right common carotid artery. Intermediate waveform maintained. Homogeneous plaque without significant calcifications at the right carotid bifurcation. Low resistance waveform of the right ICA. No significant tortuosity. RIGHT VERTEBRAL ARTERY: Antegrade flow with low resistance waveform. LEFT CAROTID ARTERY:  No significant calcified disease of the left common carotid artery. Intermediate waveform maintained. Homogeneous plaque at the left carotid bifurcation without significant calcifications. Low resistance waveform of the left ICA. LEFT VERTEBRAL ARTERY:  Antegrade flow with low resistance waveform. IMPRESSION: Color duplex indicates minimal homogeneous plaque, with no hemodynamically significant stenosis by duplex criteria in the extracranial cerebrovascular circulation. Signed, Dulcy Fanny. Dellia Nims, RPVI [facility] Radiology Electronically Signed   By: Corrie Mckusick D.O.   On: 12/26/2018 17:07   ECHOCARDIOGRAM COMPLETE  Result Date: 12/26/2018   ECHOCARDIOGRAM REPORT   Patient Name:   BRINNLEY CONSTANTINE Date of Exam: 12/26/2018 Medical Rec #:  [id number]            Height:       63.0 in Accession #:    [id number]           Weight:       140.0 lb Date of Birth:  06/11/1950            BSA:          1.66 m Patient Age:    8 years             BP:           110/48 mmHg Patient Gender: F                    HR:           72 bpm. Exam Location:  [facility] Procedure: 2D Echo, Cardiac Doppler and Color Doppler Indications:     TIA 435.9  History:         Patient has prior history of Echocardiogram examinations, most                  recent 04/22/2015.  Migraine.  Sonographer:     Sherrie Sport RDCS (AE) Referring Phys:  [id number] Athena Masse Diagnosing Phys: Ida Rogue MD IMPRESSIONS  1. Left ventricular ejection fraction, by visual estimation, is 60 to 65%. The left ventricle has normal function. There is no left ventricular hypertrophy.  2. Left ventricular diastolic parameters are consistent with Grade I diastolic dysfunction (impaired relaxation).  3. The left ventricle has no regional wall motion abnormalities.  4. Global right ventricle has normal systolic function.The right ventricular size is normal. No increase in right ventricular wall thickness.  5. Left atrial size was normal.  6. Tricuspid valve regurgitation mild-moderate.  7. Normal pulmonary artery systolic pressure. FINDINGS  Left Ventricle: Left ventricular ejection fraction, by visual estimation, is 60 to 65%. The left ventricle has normal function. The left ventricle has no regional wall motion abnormalities. There is no left ventricular hypertrophy. Left ventricular diastolic parameters are consistent with Grade I diastolic dysfunction (impaired relaxation). Normal left atrial pressure. Right Ventricle: The right ventricular size is normal. No increase in right ventricular wall thickness. Global RV systolic function is has normal systolic function. The tricuspid regurgitant velocity is 2.18 m/s, and with an assumed right atrial pressure  of 10 mmHg, the estimated right ventricular systolic pressure is normal at 29.0 mmHg. Left Atrium: Left atrial size was normal in size. Right Atrium: Right atrial size was normal in size Pericardium: There is no evidence of pericardial effusion. Mitral Valve: The mitral valve is normal in structure. Mild mitral valve regurgitation. No evidence of mitral valve stenosis by observation. Tricuspid Valve: The tricuspid valve is normal in structure. Tricuspid valve regurgitation mild-moderate. Aortic Valve: The aortic  valve is normal in structure. Aortic valve  regurgitation is not visualized. The aortic valve is structurally normal, with no evidence of sclerosis or stenosis. Aortic valve mean gradient measures 2.5 mmHg. Aortic valve peak gradient measures 4.6 mmHg. Aortic valve area, by VTI measures 2.16 cm. Pulmonic Valve: The pulmonic valve was normal in structure. Pulmonic valve regurgitation is not visualized. Pulmonic regurgitation is not visualized. Aorta: The aortic root, ascending aorta and aortic arch are all structurally normal, with no evidence of dilitation or obstruction. Venous: The inferior vena cava is normal in size with greater than 50% respiratory variability, suggesting right atrial pressure of 3 mmHg. IAS/Shunts: No atrial level shunt detected by color flow Doppler. There is no evidence of a patent foramen ovale. No ventricular septal defect is seen or detected. There is no evidence of an atrial septal defect.  LEFT VENTRICLE PLAX 2D LVIDd:         3.68 cm  Diastology LVIDs:         2.16 cm  LV e' lateral:   5.66 cm/s LV PW:         0.79 cm  LV E/e' lateral: 9.1 LV IVS:        0.94 cm  LV e' medial:    4.24 cm/s LVOT diam:     2.00 cm  LV E/e' medial:  12.1 LV SV:         42 ml LV SV Index:   24.78 LVOT Area:     3.14 cm  RIGHT VENTRICLE RV Basal diam:  3.12 cm RV S prime:     16.30 cm/s TAPSE (M-mode): 2.7 cm LEFT ATRIUM             Index       RIGHT ATRIUM           Index LA diam:        3.10 cm 1.87 cm/m  RA Area:     14.90 cm LA Vol (A2C):   29.8 ml 17.93 ml/m RA Volume:   37.30 ml  22.45 ml/m LA Vol (A4C):   33.2 ml 19.98 ml/m LA Biplane Vol: 31.8 ml 19.14 ml/m  AORTIC VALVE                   PULMONIC VALVE AV Area (Vmax):    2.33 cm    PV Vmax:        0.48 m/s AV Area (Vmean):   2.05 cm    PV Peak grad:   0.9 mmHg AV Area (VTI):     2.16 cm    RVOT Peak grad: 2 mmHg AV Vmax:           107.65 cm/s AV Vmean:          74.950 cm/s AV VTI:            0.249 m AV Peak Grad:      4.6 mmHg AV Mean Grad:      2.5 mmHg LVOT Vmax:         79.80  cm/s LVOT Vmean:        48.900 cm/s LVOT VTI:          0.171 m LVOT/AV VTI ratio: 0.69  AORTA Ao Root diam: 2.80 cm MITRAL VALVE                        TRICUSPID VALVE MV Area (PHT): 3.00 cm  TR Peak grad:   19.0 mmHg MV PHT:        73.37 msec           TR Vmax:        220.00 cm/s MV Decel Time: 253 msec MV E velocity: 51.40 cm/s 103 cm/s  SHUNTS MV A velocity: 66.50 cm/s 70.3 cm/s Systemic VTI:  0.17 m MV E/A ratio:  0.77       1.5       Systemic Diam: 2.00 cm  Ida Rogue MD Electronically signed by Ida Rogue MD Signature Date/Time: 12/26/2018/7:06:56 PM    Final    SLEEP STUDY DOCUMENTS  Result Date: 11/29/2018 Ordered by an unspecified provider.        Discharge Exam: Vitals:   12/27/18 0315 12/27/18 0654  BP: 135/72 (!) 142/96  Pulse: (!) 53 77  Resp:    Temp:  97.9 F (36.6 C)  SpO2: 97% 97%   Vitals:   12/26/18 2008 12/26/18 2231 12/27/18 0315 12/27/18 0654  BP: 136/74 (!) 165/83 135/72 (!) 142/96  Pulse: (!) 54 62 (!) 53 77  Resp:      Temp: 98.1 F (36.7 C)   97.9 F (36.6 C)  TempSrc: Oral   Oral  SpO2: 96% 100% 97% 97%  Weight:      Height:        General: Pt is alert, awake, not in acute distress, angry that she is being d/c'd Cardiovascular: RRR, S1/S2 +, no rubs, no gallops Respiratory: CTA bilaterally, no wheezing, no rhonchi Abdominal: Soft, NT, ND, bowel sounds + Extremities: no edema, no cyanosis    The results of significant diagnostics from this hospitalization (including imaging, microbiology, ancillary and laboratory) are listed below for reference.     Microbiology: Recent Results (from the past 240 hour(s))  SARS CORONAVIRUS 2 (TAT 6-24 HRS) Nasopharyngeal Nasopharyngeal Swab     Status: None   Collection Time: 12/25/18 10:37 PM   Specimen: Nasopharyngeal Swab  Result Value Ref Range Status   SARS Coronavirus 2 NEGATIVE NEGATIVE Final    Comment: (NOTE) SARS-CoV-2 target nucleic acids are NOT DETECTED. The SARS-CoV-2  RNA is generally detectable in upper and lower respiratory specimens during the acute phase of infection. Negative results do not preclude SARS-CoV-2 infection, do not rule out co-infections with other pathogens, and should not be used as the sole basis for treatment or other patient management decisions. Negative results must be combined with clinical observations, patient history, and epidemiological information. The expected result is Negative. Fact Sheet for Patients: SugarRoll.be Fact Sheet for Healthcare Providers: https://www.woods-mathews.com/ This test is not yet approved or cleared by the [place] FDA and  has been authorized for detection and/or diagnosis of SARS-CoV-2 by FDA under an Emergency Use Authorization (EUA). This EUA will remain  in effect (meaning this test can be used) for the duration of the COVID-19 declaration under Section 56 4(b)(1) of the Act, 21 U.S.C. section 360bbb-3(b)(1), unless the authorization is terminated or revoked sooner. Performed at [facility], [place] [address], [place], [place] [address]      Labs: BNP (last 3 results) Recent Labs    06/08/18 1301  BNP [id number]   Basic Metabolic Panel: Recent Labs  Lab 12/25/18 1623 12/26/18 0627  NA 139 140  K 4.0 4.0  CL 102 104  CO2 28 26  GLUCOSE 97 141*  BUN 15 16  CREATININE 0.72 0.63  CALCIUM 9.2 9.5   Liver Function Tests: Recent Labs  Lab 12/25/18 1623  AST 55*  ALT 62*  ALKPHOS 98  BILITOT 0.4  PROT 7.4  ALBUMIN 4.3   No results for input(s): LIPASE, AMYLASE in the last 168 hours. No results for input(s): AMMONIA in the last 168 hours. CBC: Recent Labs  Lab 12/25/18 1623 12/26/18 0627  WBC 5.9 6.9  HGB 12.9 14.0  HCT 37.4 41.1  MCV 89.3 90.1  PLT 276 290   Cardiac Enzymes: No results for input(s): CKTOTAL, CKMB, CKMBINDEX, TROPONINI in the last 168 hours. BNP: Invalid input(s): POCBNP CBG: No results for  input(s): GLUCAP in the last 168 hours. D-Dimer No results for input(s): DDIMER in the last 72 hours. Hgb A1c Recent Labs    12/26/18 0627  HGBA1C 6.0*   Lipid Profile Recent Labs    12/26/18 0627  CHOL 199  HDL 62  LDLCALC 75  TRIG 312*  CHOLHDL 3.2   Thyroid function studies Recent Labs    12/25/18 1623  TSH 1.806   Anemia work up No results for input(s): VITAMINB12, FOLATE, FERRITIN, TIBC, IRON, RETICCTPCT in the last 72 hours. Urinalysis    Component Value Date/Time   COLORURINE COLORLESS (A) 12/25/2018 2054   APPEARANCEUR CLEAR (A) 12/25/2018 2054   APPEARANCEUR Clear 10/30/2014 1037   LABSPEC 1.005 12/25/2018 2054   LABSPEC 1.002 04/14/2014 1512   PHURINE 8.0 12/25/2018 2054   GLUCOSEU NEGATIVE 12/25/2018 2054   GLUCOSEU Negative 04/14/2014 1512   HGBUR NEGATIVE 12/25/2018 2054   BILIRUBINUR NEGATIVE 12/25/2018 2054   BILIRUBINUR neg 09/04/2018 1412   BILIRUBINUR Negative 10/30/2014 1037   BILIRUBINUR Negative 04/14/2014 [place] 12/25/2018 2054   PROTEINUR NEGATIVE 12/25/2018 2054   UROBILINOGEN 0.2 09/04/2018 1412   UROBILINOGEN 0.2 01/15/2013 1820   NITRITE NEGATIVE 12/25/2018 2054   LEUKOCYTESUR NEGATIVE 12/25/2018 2054   LEUKOCYTESUR Negative 04/14/2014 1512   Sepsis Labs Invalid input(s): PROCALCITONIN,  WBC,  LACTICIDVEN Microbiology Recent Results (from the past 240 hour(s))  SARS CORONAVIRUS 2 (TAT 6-24 HRS) Nasopharyngeal Nasopharyngeal Swab     Status: None   Collection Time: 12/25/18 10:37 PM   Specimen: Nasopharyngeal Swab  Result Value Ref Range Status   SARS Coronavirus 2 NEGATIVE NEGATIVE Final    Comment: (NOTE) SARS-CoV-2 target nucleic acids are NOT DETECTED. The SARS-CoV-2 RNA is generally detectable in upper and lower respiratory specimens during the acute phase of infection. Negative results do not preclude SARS-CoV-2 infection, do not rule out co-infections with other pathogens, and should not be used as  the sole basis for treatment or other patient management decisions. Negative results must be combined with clinical observations, patient history, and epidemiological information. The expected result is Negative. Fact Sheet for Patients: SugarRoll[employer]be Fact Sheet for Healthcare Providers: https://www.woods-mathews.com/ This test is not yet approved or cleared by the [place] FDA and  has been authorized for detection and/or diagnosis of SARS-CoV-2 by FDA under an Emergency Use Authorization (EUA). This EUA will remain  in effect (meaning this test can be used) for the duration of the COVID-19 declaration under Section 56 4(b)(1) of the Act, 21 U.S.C. section 360bbb-3(b)(1), unless the authorization is terminated or revoked sooner. Performed at [facility], [place] [address] [place], [place] [address]      Time coordinating discharge: Over 30 minutes  SIGNED:   Nolberto Hanlon, MD  [facility] 12/27/2018, 10:54 AM Pager   If 7PM-7AM, please contact night-coverage [email or web address] Password [email or web address]

## 2018-12-27 NOTE — Telephone Encounter (Signed)
Patient is calling, states he nurse in the hospital told her "Dr. Rockey Situ had fired her". Patient would like an explanation regarding this. Please call to discuss.

## 2018-12-27 NOTE — Consult Note (Signed)
Cardiology Consultation:   Patient ID: Angela [name] MRN: [id number]; DOB: 1950/05/09  Admit date: 12/25/2018 Date of Consult: 12/27/2018  Primary Care Provider: Crecencio Mc, MD Primary Cardiologist: [facility] -Physician requesting consult Dr. Kurtis Bushman Reason for consult: Possible TIA   Patient Profile:   Angela [name] is a 68 y.o. female with a hx of  COPD Leg edema Bipolar;/personality disorder/Depression/ PTSD seen by psychiatry Former smoker, stopped in her 61s large hiatal hernia Scoliosis, On CT: Minimal aortic athero in the descending aorta No significant coronary calcification, no significant carotid calcification Presenting to the hospital with confusion  History of Present Illness:   Angela [name] was last seen in clinic October 14, 2018 She reports occasional constipation issues, fullness in her chest, anorexia from hiatal hernia Previous chest pain felt to be atypical in nature  Seen in the ER 07/11/2018, muiltiple issues, including chest pain CT angio chest: Large hiatal hernia unchanged. No significant atherosclerosis  emergency room February 2020 for shortness of breath  left-sided chest pain Work-up negative  Seen by Dr. Philis Kendall She needs to undergo EGD colonoscopy prior to laparoscopic hiatal hernia repair and fundoplication, possible gastropexy.  Surgery has not been scheduled yet  Was referred to GI in [facility] for EGD colonoscopy and management of her chronic constipation.  Was told the procedure needed to be done at [place] brought her into the hospital this admission for confusion, memory loss She reports lots of recent stressors, was told by landlady that she needed to move out in a month Told by Dr. Mortimer Fries and primary care/[place] that she was being discharged from there practice Currently with no primary care physician  On my examination today reports that she feels back to normal, She feels that she had a  TIA leading to confusion " My mother had TIAs all the time"  MRA head reviewed with her, no new acute large stroke She reports allergy to aspirin   Heart Pathway Score:     Past Medical History:  Diagnosis Date  . Allergic rhinitis   . Anemia   . Blurred vision   . Depression   . Diverticulosis   . Endometriosis   . Falls   . GERD (gastroesophageal reflux disease)   . Hernia, hiatal   . Hypothyroidism   . IBS (irritable bowel syndrome)   . Malignant neoplasm of skin   . Migraine   . Neuropathy   . PTSD (post-traumatic stress disorder)   . Thyroid disease     Past Surgical History:  Procedure Laterality Date  . APPENDECTOMY    . CHOLECYSTECTOMY    . CHOLECYSTECTOMY, LAPAROSCOPIC    . ESOPHAGOGASTRODUODENOSCOPY (EGD) WITH PROPOFOL N/A 03/29/2015   Procedure: ESOPHAGOGASTRODUODENOSCOPY (EGD) WITH PROPOFOL;  Surgeon: Josefine Class, MD;  Location: [facility] ENDOSCOPY;  Service: Endoscopy;  Laterality: N/A;  . HERNIA REPAIR    . laproscopy    . TONSILLECTOMY    . uteral suspension       Home Medications:  Prior to Admission medications   Medication Sig Start Date End Date Taking? Authorizing Provider  acetaminophen (TYLENOL) 650 MG CR tablet Take 650 mg by mouth every 8 (eight) hours as needed for pain.    [provider]  Acetylcysteine (NAC) 500 MG CAPS Take 500 mg by mouth 2 (two) times a day.    [provider]  albuterol (VENTOLIN HFA) 108 (90 Base) MCG/ACT inhaler Inhale 2 puffs into the lungs every 4 (four) hours as needed  for wheezing or shortness of breath. 11/27/18   Crecencio Mc, MD  Amino Acids-Protein Hydrolys (FEEDING SUPPLEMENT, PRO-STAT 64,) LIQD Take 30 mLs by mouth daily. 04/12/18   Poulose, Bethel Born, NP  atorvastatin (LIPITOR) 20 MG tablet Take 1 tablet (20 mg total) by mouth at bedtime. 11/26/18   Crecencio Mc, MD  azithromycin (ZITHROMAX) 250 MG tablet Take 1 tablet (250 mg total) by mouth every other day. Take every Mon, Wed,  Fri 11/26/18   Crecencio Mc, MD  Biotin 10000 MCG TABS Take 10,000 mcg by mouth daily.     [provider]  Calcium Citrate (CITRACAL PO) Take 800 mg by mouth. Takes 2 (400mg ) tablets BID    [provider]  Cholecalciferol 25 MCG (1000 UT) tablet Take 1 tablet (1,000 Units total) by mouth daily. 12/26/17   Clapacs, Madie Reno, MD  Cyanocobalamin (VITAMIN B-12) 1000 MCG SUBL Place 1,000 mcg under the tongue.     [provider]  diclofenac sodium (VOLTAREN) 1 % GEL Apply small amount (2 grams) topically over knees or hands up to four times a day if needed for arthritis 11/26/18   Crecencio Mc, MD  doxycycline (VIBRA-TABS) 100 MG tablet Take 1 tablet (100 mg total) by mouth 2 (two) times daily. 12/02/18   Flora Lipps, MD  estradiol (ESTRING) 2 MG vaginal ring Place 2 mg vaginally every 3 (three) months. follow package directions 10/29/18   Rubie Maid, MD  famotidine (PEPCID) 20 MG tablet Take 1 tablet (20 mg total) by mouth 2 (two) times daily as needed for heartburn or indigestion. 11/26/18   Crecencio Mc, MD  ferrous sulfate 325 (65 FE) MG tablet Take 1 tablet (325 mg total) by mouth daily with breakfast. 11/26/18   Crecencio Mc, MD  fluconazole (DIFLUCAN) 150 MG tablet TAKE 1 TABLET EACH WEEK WHILE TAKING DOXYCYCLINE 07/27/18   [provider]  fluticasone (FLONASE) 50 MCG/ACT nasal spray Place 2 sprays into both nostrils daily.     [provider]  gabapentin (NEURONTIN) 100 MG capsule Take 100 mg by mouth 2 (two) times daily.    [provider]  hydrOXYzine (ATARAX/VISTARIL) 10 MG tablet TAKE 1 TO 2 TABLETS BY MOUTH ONCE DAILY AS NEEDED 02/05/18   [provider]  Icosapent Ethyl (VASCEPA) 1 g CAPS TAKE 2 CAPSULES BY MOUTH TWICE DAILY (TO HELP WITH TRIGLYCERIDES) 11/26/18   Crecencio Mc, MD  Lactulose 20 GM/30ML SOLN 30 ml every 4 hours until constipation is relieved 11/08/18   Crecencio Mc, MD  levalbuterol (XOPENEX)  0.63 MG/3ML nebulizer solution Take 3 mLs (0.63 mg total) by nebulization every 4 (four) hours as needed for wheezing or shortness of breath. 11/26/18   Crecencio Mc, MD  levothyroxine (SYNTHROID) 100 MCG tablet TAKE 1 TABLET BY MOUTH ONCE DAILY AT  6AM 11/26/18   Crecencio Mc, MD  levothyroxine (SYNTHROID) 50 MCG tablet Take 1 tablet (50 mcg total) by mouth daily at 6 (six) AM. 12/28/18   Nolberto Hanlon, MD  linaclotide Rolan Lipa) 290 MCG CAPS capsule Take 1 capsule (290 mcg total) by mouth daily before breakfast. 12/26/17   Clapacs, Madie Reno, MD  LORazepam (ATIVAN) 0.5 MG tablet TAKE 1 TABLET BY MOUTH AS NEEDED ONCE DAILY 02/06/18   [provider]  losartan (COZAAR) 50 MG tablet Take 1 tablet (50 mg total) by mouth daily. 11/26/18 02/24/19  Crecencio Mc, MD  Magnesium 500 MG TABS Take 500 mg by  mouth every other day.    [provider]  Multiple Vitamin (MULTIVITAMIN WITH MINERALS) TABS tablet Take 1 tablet by mouth daily. 12/26/17   Clapacs, Madie Reno, MD  ondansetron (ZOFRAN ODT) 4 MG disintegrating tablet Allow 1-2 tablets to dissolve in your mouth every 8 hours as needed for nausea/vomiting 12/04/18   Hinda Kehr, MD  PARoxetine (PAXIL) 20 MG tablet Take 20 mg by mouth daily.    [provider]  prednisoLONE acetate (PRED FORTE) 1 % ophthalmic suspension INSTILL 1 DROP INTO EACH EYE TWICE DAILY 07/26/18   [provider]  pregabalin (LYRICA) 75 MG capsule Take 75 mg by mouth 2 (two) times daily.     [provider]  PREMARIN vaginal cream Place 1 Applicatorful vaginally 2 (two) times a week. 11/28/18   Crecencio Mc, MD  pyridoxine (B-6) 100 MG tablet Take 1 tablet (100 mg total) by mouth 2 (two) times daily. 12/26/17   Clapacs, Madie Reno, MD  tiZANidine (ZANAFLEX) 2 MG tablet Take by mouth every 6 (six) hours as needed for muscle spasms.    [provider]  traZODone (DESYREL) 50 MG tablet Take 1 tablet (50 mg total) by mouth at bedtime as  needed. Pt takes 1/2 tablet qhs 11/26/18   Crecencio Mc, MD  triamcinolone ointment (KENALOG) 0.5 % Apply 1 application topically 2 (two) times daily. 11/26/18   Crecencio Mc, MD  umeclidinium bromide (INCRUSE ELLIPTA) 62.5 MCG/INH AEPB Inhale 1 puff into the lungs daily. 11/26/18   Crecencio Mc, MD  XIIDRA 5 % SOLN INSTILL 1 DROP INTO EACH EYE TWICE DAILY 07/27/18   [provider]    Inpatient Medications: Scheduled Meds: . atorvastatin  20 mg Oral QHS  . cholecalciferol  1,000 Units Oral Daily  . enoxaparin (LOVENOX) injection  40 mg Subcutaneous Q24H  . feeding supplement (ENSURE ENLIVE)  237 mL Oral TID BM  . ferrous sulfate  325 mg Oral Q breakfast  . ibuprofen  400 mg Oral Once  . levothyroxine  50 mcg Oral Q0600  . linaclotide  290 mcg Oral QAC breakfast  . losartan  50 mg Oral Once  . losartan  50 mg Oral Daily  . PARoxetine  20 mg Oral Daily  . sodium chloride flush  3 mL Intravenous Q12H  . umeclidinium bromide  1 puff Inhalation Daily   Continuous Infusions: . sodium chloride     PRN Meds: sodium chloride, acetaminophen, famotidine, hydrALAZINE, levalbuterol, LORazepam, morphine injection, ondansetron, senna-docusate, sodium chloride flush, traZODone  Allergies:    Allergies  Allergen Reactions  . Advair Diskus [Fluticasone-Salmeterol] Shortness Of Breath  . Bee Venom Hives  . Darvon [Propoxyphene] Anaphylaxis and Hives    Hives/throat swelling   . Dicyclomine Itching  . Other Hives    Spider bites cause hives  . Oxycodone-Acetaminophen Nausea And Vomiting and Other (See Comments)    hallucinations   . Peanuts [Peanut Oil] Anaphylaxis and Hives  . Penicillins Hives    Has patient had a PCN reaction causing immediate rash, facial/tongue/throat swelling, SOB or lightheadedness with hypotension: Unknown Has patient had a PCN reaction causing severe rash involving mucus membranes or skin necrosis: Unknown Has patient had a PCN reaction that  required hospitalization: Unknown Has patient had a PCN reaction occurring within the last 10 years: Unknown If all of the above answers are "NO", then may proceed with Cephalosporin use.  . Sulfa Antibiotics Anaphylaxis, Hives and Itching  . Sulfacetamide Sodium Anaphylaxis, Hives  and Itching  . Ultram [Tramadol] Hives  . Amikacin Nausea And Vomiting  . Aspirin Hives  . Haloperidol Nausea And Vomiting  . Hymenoptera Venom Preparations Hives    Reaction to spider bites  . Imipramine Hives    Reaction to Tofranil  . Keflex [Cephalexin] Hives  . Lactose   . Lactose Intolerance (Gi)   . Risperdal [Risperidone]     Serotonin syndrome  . Sulfur Hives  . Adhesive [Tape] Hives and Rash  . Albuterol Palpitations    Tolerates levalbuterol  . Ciprofloxacin Other (See Comments)    tendonopathy  . Hydroxyzine Anxiety and Itching  . Peanut Butter Flavor Anxiety  . Prednisone Nausea And Vomiting, Anxiety and Other (See Comments)    Crazy mood swings, strange thoughts. Tolerates depomedrol     Social History:   Social History   Socioeconomic History  . Marital status: Single    Spouse name: Not on file  . Number of children: 0  . Years of education: 1.5 years of college  . Highest education level: Some college, no degree  Occupational History  . Occupation: Retired  Tobacco Use  . Smoking status: Former Smoker    Quit date: 11/17/1976    Years since quitting: 42.1  . Smokeless tobacco: Never Used  Substance and Sexual Activity  . Alcohol use: Yes    Comment: occ  . Drug use: No  . Sexual activity: Not Currently  Other Topics Concern  . Not on file  Social History Narrative   Lives at home alone.   Right-handed.   No daily caffeine use.   Social Determinants of Health   Financial Resource Strain:   . Difficulty of Paying Living Expenses: Not on file  Food Insecurity:   . Worried About Charity fundraiser in the Last Year: Not on file  . Ran Out of Food in the Last Year:  Not on file  Transportation Needs:   . Lack of Transportation (Medical): Not on file  . Lack of Transportation (Non-Medical): Not on file  Physical Activity:   . Days of Exercise per Week: Not on file  . Minutes of Exercise per Session: Not on file  Stress:   . Feeling of Stress : Not on file  Social Connections:   . Frequency of Communication with Friends and Family: Not on file  . Frequency of Social Gatherings with Friends and Family: Not on file  . Attends Religious Services: Not on file  . Active Member of Clubs or Organizations: Not on file  . Attends Archivist Meetings: Not on file  . Marital Status: Not on file  Intimate Partner Violence:   . Fear of Current or Ex-Partner: Not on file  . Emotionally Abused: Not on file  . Physically Abused: Not on file  . Sexually Abused: Not on file    Family History:    Family History  Problem Relation Age of Onset  . Heart disease Father   . Hearing loss Father   . COPD Father   . Depression Father   . Stroke Father   . Vision loss Father   . Varicose Veins Father   . Cancer Mother        lung  . Depression Sister   . Arthritis Sister   . Arthritis Brother   . Hernia Brother   . Anxiety disorder Brother   . Arthritis Brother   . Hernia Brother   . Anxiety disorder Brother   . Urolithiasis  Neg Hx   . Kidney disease Neg Hx   . Kidney cancer Neg Hx   . Prostate cancer Neg Hx      ROS:  Please see the history of present illness.  Review of Systems  Constitutional: Negative.   HENT: Negative.   Respiratory: Negative.   Cardiovascular: Negative.   Gastrointestinal: Negative.   Musculoskeletal: Negative.   Neurological: Negative.   Psychiatric/Behavioral: Negative.   All other systems reviewed and are negative.   Physical Exam/Data:   Vitals:   12/26/18 2231 12/27/18 0315 12/27/18 0654 12/27/18 1057  BP: (!) 165/83 135/72 (!) 142/96 (!) 141/79  Pulse: 62 (!) 53 77   Resp:    16  Temp:   97.9 F  (36.6 C) 98 F (36.7 C)  TempSrc:   Oral Oral  SpO2: 100% 97% 97% 100%  Weight:      Height:       No intake or output data in the 24 hours ending 12/27/18 1346 Last 3 Weights 12/25/2018 12/04/2018 11/26/2018  Weight (lbs) 140 lb 141 lb 5 oz 141 lb 4.8 oz  Weight (kg) 63.504 kg 64.1 kg 64.093 kg  Some encounter information is confidential and restricted. Go to Review Flowsheets activity to see all data.     Body mass index is 24.8 kg/m.  General:  Well nourished, well developed, in no acute distress HEENT: normal Lymph: no adenopathy Neck: no JVD Endocrine:  No thryomegaly Vascular: No carotid bruits; FA pulses 2+ bilaterally without bruits  Cardiac:  normal S1, S2; RRR; no murmur  Lungs:  clear to auscultation bilaterally, no wheezing, rhonchi or rales  Abd: soft, nontender, no hepatomegaly  Ext: no edema Musculoskeletal:  No deformities, BUE and BLE strength normal and equal Skin: warm and dry  Neuro:  CNs 2-12 intact, no focal abnormalities noted Psych:  Normal affect   EKG:  The EKG was personally reviewed and demonstrates:   Shows normal sinus rhythm with rate 62 bpm no significant ST-T wave changes  Telemetry:  Telemetry was personally reviewed and demonstrates: Normal sinus rhythm  Relevant CV Studies: Echocardiogram personally reviewed by myself showing normal function, no valve disease  Laboratory Data:  High Sensitivity Troponin:   Recent Labs  Lab 12/04/18 0303 12/25/18 1623  TROPONINIHS 3 3     Chemistry Recent Labs  Lab 12/25/18 1623 12/26/18 0627  NA 139 140  K 4.0 4.0  CL 102 104  CO2 28 26  GLUCOSE 97 141*  BUN 15 16  CREATININE 0.72 0.63  CALCIUM 9.2 9.5  GFRNONAA >60 >60  GFRAA >60 >60  ANIONGAP 9 10    Recent Labs  Lab 12/25/18 1623  PROT 7.4  ALBUMIN 4.3  AST 55*  ALT 62*  ALKPHOS 98  BILITOT 0.4   Hematology Recent Labs  Lab 12/25/18 1623 12/26/18 0627  WBC 5.9 6.9  RBC 4.19 4.56  HGB 12.9 14.0  HCT 37.4 41.1  MCV  89.3 90.1  MCH 30.8 30.7  MCHC 34.5 34.1  RDW 14.1 14.1  PLT 276 290   BNPNo results for input(s): BNP, PROBNP in the last 168 hours.  DDimer No results for input(s): DDIMER in the last 168 hours.   Radiology/Studies:  CT Head Wo Contrast  Result Date: 12/25/2018 CLINICAL DATA:  Altered level of consciousness EXAM: CT HEAD WITHOUT CONTRAST TECHNIQUE: Contiguous axial images were obtained from the base of the skull through the vertex without intravenous contrast. COMPARISON:  07/11/2018 FINDINGS: Brain: Stable lacunar infarct  within the head of the caudate nucleus on the right. No new hemorrhage, infarct or space-occupying mass lesion is seen. Vascular: No hyperdense vessel or unexpected calcification. Skull: Normal. Negative for fracture or focal lesion. Sinuses/Orbits: No acute finding. Other: None. IMPRESSION: Chronic changes without acute abnormality. Electronically Signed   By: Inez Catalina M.D.   On: 12/25/2018 19:23   MR BRAIN WO CONTRAST  Result Date: 12/27/2018 CLINICAL DATA:  Delirium, dizziness, confusion. Additional history provided: Intermittent episodes of confusion and altered mental status. EXAM: MRI HEAD WITHOUT CONTRAST TECHNIQUE: Multiplanar, multiecho pulse sequences of the brain and surrounding structures were obtained without intravenous contrast. COMPARISON:  Head CT 12/25/2018, brain MRI 04/21/2015 FINDINGS: Brain: There is no evidence of acute infarct. No evidence of intracranial mass. No midline shift or extra-axial fluid collection. Chronic microhemorrhage within the right parietal lobe. Mild scattered T2/FLAIR hyperintensity within the cerebral white matter is nonspecific, but consistent with chronic small vessel ischemic disease. Redemonstrated chronic lacunar infarct within the right caudate head. Mild generalized parenchymal atrophy. Vascular: Flow voids maintained within the proximal large arterial vessels. Skull and upper cervical spine: No focal marrow lesion.  Sinuses/Orbits: Visualized orbits demonstrate no acute abnormality. No significant paranasal sinus disease or mastoid effusion. IMPRESSION: 1. No evidence of acute intracranial abnormality, including acute infarct. 2. Mild generalized parenchymal atrophy and chronic small vessel ischemic disease. Redemonstrated chronic right basal ganglia lacunar infarct. Electronically Signed   By: Kellie Simmering DO   On: 12/27/2018 08:14   US Carotid Bilateral (at [facility] and [facility] only)  Result Date: 12/26/2018 CLINICAL DATA:  68 year old female with a history of TIA EXAM: BILATERAL CAROTID DUPLEX ULTRASOUND TECHNIQUE: Pearline Cables scale imaging, color Doppler and duplex ultrasound were performed of bilateral carotid and vertebral arteries in the neck. COMPARISON:  None. FINDINGS: Criteria: Quantification of carotid stenosis is based on velocity parameters that correlate the residual internal carotid diameter with NASCET-based stenosis levels, using the diameter of the distal internal carotid lumen as the denominator for stenosis measurement. The following velocity measurements were obtained: RIGHT ICA:  Systolic 76 cm/sec, Diastolic 23 cm/sec CCA:  81 cm/sec SYSTOLIC ICA/CCA RATIO:  0.9 ECA:  50 cm/sec LEFT ICA:  Systolic 89 cm/sec, Diastolic 26 cm/sec CCA:  87 cm/sec SYSTOLIC ICA/CCA RATIO:  1.0 ECA:  59 cm/sec Right Brachial SBP: Not acquired Left Brachial SBP: Not acquired RIGHT CAROTID ARTERY: No significant calcified disease of the right common carotid artery. Intermediate waveform maintained. Homogeneous plaque without significant calcifications at the right carotid bifurcation. Low resistance waveform of the right ICA. No significant tortuosity. RIGHT VERTEBRAL ARTERY: Antegrade flow with low resistance waveform. LEFT CAROTID ARTERY: No significant calcified disease of the left common carotid artery. Intermediate waveform maintained. Homogeneous plaque at the left carotid bifurcation without significant calcifications. Low  resistance waveform of the left ICA. LEFT VERTEBRAL ARTERY:  Antegrade flow with low resistance waveform. IMPRESSION: Color duplex indicates minimal homogeneous plaque, with no hemodynamically significant stenosis by duplex criteria in the extracranial cerebrovascular circulation. Signed, Dulcy Fanny. Dellia Nims, RPVI [facility] Electronically Signed   By: Corrie Mckusick D.O.   On: 12/26/2018 17:07   ECHOCARDIOGRAM COMPLETE  Result Date: 12/26/2018   ECHOCARDIOGRAM REPORT   Patient Name:   Angela [name] Date of Exam: 12/26/2018 Medical Rec #:  [id number]            Height:       63.0 in Accession #:    [id number]  Weight:       140.0 lb Date of Birth:  [birth year]            BSA:          1.66 m Patient Age:    31 years             BP:           110/48 mmHg Patient Gender: F                    HR:           72 bpm. Exam Location:  [facility] Procedure: 2D Echo, Cardiac Doppler and Color Doppler Indications:     TIA 435.9  History:         Patient has prior history of Echocardiogram examinations, most                  recent 04/22/2015. Migraine.  Sonographer:     Sherrie Sport RDCS (AE) Referring Phys:  [id number] Athena Masse Diagnosing Phys: Ida Rogue MD IMPRESSIONS  1. Left ventricular ejection fraction, by visual estimation, is 60 to 65%. The left ventricle has normal function. There is no left ventricular hypertrophy.  2. Left ventricular diastolic parameters are consistent with Grade I diastolic dysfunction (impaired relaxation).  3. The left ventricle has no regional wall motion abnormalities.  4. Global right ventricle has normal systolic function.The right ventricular size is normal. No increase in right ventricular wall thickness.  5. Left atrial size was normal.  6. Tricuspid valve regurgitation mild-moderate.  7. Normal pulmonary artery systolic pressure. FINDINGS  Left Ventricle: Left ventricular ejection fraction, by visual estimation,  is 60 to 65%. The left ventricle has normal function. The left ventricle has no regional wall motion abnormalities. There is no left ventricular hypertrophy. Left ventricular diastolic parameters are consistent with Grade I diastolic dysfunction (impaired relaxation). Normal left atrial pressure. Right Ventricle: The right ventricular size is normal. No increase in right ventricular wall thickness. Global RV systolic function is has normal systolic function. The tricuspid regurgitant velocity is 2.18 m/s, and with an assumed right atrial pressure  of 10 mmHg, the estimated right ventricular systolic pressure is normal at 29.0 mmHg. Left Atrium: Left atrial size was normal in size. Right Atrium: Right atrial size was normal in size Pericardium: There is no evidence of pericardial effusion. Mitral Valve: The mitral valve is normal in structure. Mild mitral valve regurgitation. No evidence of mitral valve stenosis by observation. Tricuspid Valve: The tricuspid valve is normal in structure. Tricuspid valve regurgitation mild-moderate. Aortic Valve: The aortic valve is normal in structure. Aortic valve regurgitation is not visualized. The aortic valve is structurally normal, with no evidence of sclerosis or stenosis. Aortic valve mean gradient measures 2.5 mmHg. Aortic valve peak gradient measures 4.6 mmHg. Aortic valve area, by VTI measures 2.16 cm. Pulmonic Valve: The pulmonic valve was normal in structure. Pulmonic valve regurgitation is not visualized. Pulmonic regurgitation is not visualized. Aorta: The aortic root, ascending aorta and aortic arch are all structurally normal, with no evidence of dilitation or obstruction. Venous: The inferior vena cava is normal in size with greater than 50% respiratory variability, suggesting right atrial pressure of 3 mmHg. IAS/Shunts: No atrial level shunt detected by color flow Doppler. There is no evidence of a patent foramen ovale. No ventricular septal defect is seen or  detected. There is no evidence of an atrial septal defect.  LEFT VENTRICLE PLAX 2D LVIDd:  3.68 cm  Diastology LVIDs:         2.16 cm  LV e' lateral:   5.66 cm/s LV PW:         0.79 cm  LV E/e' lateral: 9.1 LV IVS:        0.94 cm  LV e' medial:    4.24 cm/s LVOT diam:     2.00 cm  LV E/e' medial:  12.1 LV SV:         42 ml LV SV Index:   24.78 LVOT Area:     3.14 cm  RIGHT VENTRICLE RV Basal diam:  3.12 cm RV S prime:     16.30 cm/s TAPSE (M-mode): 2.7 cm LEFT ATRIUM             Index       RIGHT ATRIUM           Index LA diam:        3.10 cm 1.87 cm/m  RA Area:     14.90 cm LA Vol (A2C):   29.8 ml 17.93 ml/m RA Volume:   37.30 ml  22.45 ml/m LA Vol (A4C):   33.2 ml 19.98 ml/m LA Biplane Vol: 31.8 ml 19.14 ml/m  AORTIC VALVE                   PULMONIC VALVE AV Area (Vmax):    2.33 cm    PV Vmax:        0.48 m/s AV Area (Vmean):   2.05 cm    PV Peak grad:   0.9 mmHg AV Area (VTI):     2.16 cm    RVOT Peak grad: 2 mmHg AV Vmax:           107.65 cm/s AV Vmean:          74.950 cm/s AV VTI:            0.249 m AV Peak Grad:      4.6 mmHg AV Mean Grad:      2.5 mmHg LVOT Vmax:         79.80 cm/s LVOT Vmean:        48.900 cm/s LVOT VTI:          0.171 m LVOT/AV VTI ratio: 0.69  AORTA Ao Root diam: 2.80 cm MITRAL VALVE                        TRICUSPID VALVE MV Area (PHT): 3.00 cm             TR Peak grad:   19.0 mmHg MV PHT:        73.37 msec           TR Vmax:        220.00 cm/s MV Decel Time: 253 msec MV E velocity: 51.40 cm/s 103 cm/s  SHUNTS MV A velocity: 66.50 cm/s 70.3 cm/s Systemic VTI:  0.17 m MV E/A ratio:  0.77       1.5       Systemic Diam: 2.00 cm  Ida Rogue MD Electronically signed by Ida Rogue MD Signature Date/Time: 12/26/2018/7:06:56 PM    Final     Assessment and Plan:   1. Confusion Reports that she is back to her baseline Etiology unclear She does have underlying psychiatric issues, Notes indicating bipolar, personality disorder, depression, PTSD, previously seen by  psychiatry -She is declining aspirin Recommend she continue her statin  2.  Large hiatal hernia Recommend she put surgery on hold at this time as she has significant stressors such as where she is going to live -Reports her landlord is asking her to leave within the month and needs to find a new place to live -When she gets settled , she needs EGD and colonoscopy then referral back to Dr. Margart Sickles at [facility] for hiatal hernia surgery  3.  Prior history of chest pain Noncardiac in nature, likely secondary to large hiatal hernia Minimal aortic atherosclerosis noted on prior CT scans -No further cardiac work-up needed at this time   In terms of discharge today, she is unable to get into her apartment until after 3:00 Long discussion with her concerning where she is going to live and what she is going to do She is indicated she would like to move out of the area to somewhere warmer.  She has mentions possibly moving to [place]  Total encounter time more than 90 minutes  Greater than 50% was spent in counseling and coordination of care with the patient   For questions or updates, please contact [place] Please consult [email or web address] for contact info under     Signed, Ida Rogue, MD  12/27/2018 1:46 PM

## 2018-12-27 NOTE — Progress Notes (Signed)
Received Md order to discharge patient to home, reviewed home meds, prescription, follow up appointments and discharge instructions with patient and patient verbalized understanding

## 2018-12-28 DIAGNOSIS — G629 Polyneuropathy, unspecified: Secondary | ICD-10-CM | POA: Diagnosis not present

## 2018-12-28 DIAGNOSIS — Z8673 Personal history of transient ischemic attack (TIA), and cerebral infarction without residual deficits: Secondary | ICD-10-CM | POA: Diagnosis not present

## 2018-12-28 DIAGNOSIS — Z9181 History of falling: Secondary | ICD-10-CM | POA: Diagnosis not present

## 2018-12-28 DIAGNOSIS — E039 Hypothyroidism, unspecified: Secondary | ICD-10-CM | POA: Diagnosis not present

## 2018-12-28 DIAGNOSIS — K219 Gastro-esophageal reflux disease without esophagitis: Secondary | ICD-10-CM | POA: Diagnosis not present

## 2018-12-28 DIAGNOSIS — K589 Irritable bowel syndrome without diarrhea: Secondary | ICD-10-CM | POA: Diagnosis not present

## 2018-12-28 DIAGNOSIS — I081 Rheumatic disorders of both mitral and tricuspid valves: Secondary | ICD-10-CM | POA: Diagnosis not present

## 2018-12-28 DIAGNOSIS — J449 Chronic obstructive pulmonary disease, unspecified: Secondary | ICD-10-CM | POA: Diagnosis not present

## 2018-12-28 DIAGNOSIS — Z87891 Personal history of nicotine dependence: Secondary | ICD-10-CM | POA: Diagnosis not present

## 2018-12-28 DIAGNOSIS — K449 Diaphragmatic hernia without obstruction or gangrene: Secondary | ICD-10-CM | POA: Diagnosis not present

## 2018-12-28 DIAGNOSIS — G43909 Migraine, unspecified, not intractable, without status migrainosus: Secondary | ICD-10-CM | POA: Diagnosis not present

## 2018-12-28 DIAGNOSIS — K5790 Diverticulosis of intestine, part unspecified, without perforation or abscess without bleeding: Secondary | ICD-10-CM | POA: Diagnosis not present

## 2018-12-28 DIAGNOSIS — I700 Atherosclerosis of aorta: Secondary | ICD-10-CM | POA: Diagnosis not present

## 2018-12-28 DIAGNOSIS — M419 Scoliosis, unspecified: Secondary | ICD-10-CM | POA: Diagnosis not present

## 2018-12-29 NOTE — Telephone Encounter (Signed)
She was seen in the hospital There was no active cardiac issues No follow up needed in clinic She was [place] of [place]

## 2018-12-30 ENCOUNTER — Telehealth: Payer: Self-pay

## 2018-12-30 NOTE — Telephone Encounter (Signed)
Yes  I will verbally authorize

## 2018-12-30 NOTE — Telephone Encounter (Signed)
Correct.  There is a 30 day grace period for refills,  Orders  Etc  And she is outside of the window, so you you can give a one time  verbal order for the nursing as a courtesy since she just got discharged from hospital but I will NOT see her for hospital follow up

## 2018-12-30 NOTE — Telephone Encounter (Signed)
Copied from [place] [phone]. Topic: Quick Communication - Home Health Verbal Orders >> Dec 30, 2018  8:56 AM Virl Axe D wrote: Caller/Agency: Nadeen Landau Number: [id number] Requesting OT/PT/Skilled Nursing/Social Work/Speech Therapy: PT Frequency: 1 week 1 / 2 week 6  OT evalutation Social Worker Evaluation

## 2018-12-30 NOTE — Telephone Encounter (Signed)
Pt was dismissed from practice correct?

## 2018-12-30 NOTE — Telephone Encounter (Signed)
Spoke with Verdis Frederickson and she stated that the verbal orders are for PT, OT evaluation, and social worker evaluation not nursing. Are you okay with giving verbal orders for those?

## 2018-12-31 ENCOUNTER — Ambulatory Visit: Payer: Medicare Other | Admitting: Podiatry

## 2018-12-31 NOTE — Telephone Encounter (Signed)
Verbal orders given  

## 2019-01-01 ENCOUNTER — Encounter: Payer: Self-pay | Admitting: Emergency Medicine

## 2019-01-01 ENCOUNTER — Emergency Department
Admission: EM | Admit: 2019-01-01 | Discharge: 2019-01-01 | Disposition: A | Discharge status: Home / Self Care | Payer: Medicare Other | Attending: Emergency Medicine | Admitting: Emergency Medicine

## 2019-01-01 ENCOUNTER — Other Ambulatory Visit: Payer: Self-pay

## 2019-01-01 DIAGNOSIS — Z79899 Other long term (current) drug therapy: Secondary | ICD-10-CM | POA: Insufficient documentation

## 2019-01-01 DIAGNOSIS — R4586 Emotional lability: Secondary | ICD-10-CM | POA: Insufficient documentation

## 2019-01-01 DIAGNOSIS — E039 Hypothyroidism, unspecified: Secondary | ICD-10-CM | POA: Insufficient documentation

## 2019-01-01 DIAGNOSIS — F609 Personality disorder, unspecified: Secondary | ICD-10-CM | POA: Diagnosis not present

## 2019-01-01 DIAGNOSIS — Z743 Need for continuous supervision: Secondary | ICD-10-CM | POA: Diagnosis not present

## 2019-01-01 DIAGNOSIS — Z85828 Personal history of other malignant neoplasm of skin: Secondary | ICD-10-CM | POA: Insufficient documentation

## 2019-01-01 DIAGNOSIS — R404 Transient alteration of awareness: Secondary | ICD-10-CM | POA: Insufficient documentation

## 2019-01-01 DIAGNOSIS — Z91010 Allergy to peanuts: Secondary | ICD-10-CM | POA: Insufficient documentation

## 2019-01-01 DIAGNOSIS — Z87891 Personal history of nicotine dependence: Secondary | ICD-10-CM | POA: Insufficient documentation

## 2019-01-01 DIAGNOSIS — Z9189 Other specified personal risk factors, not elsewhere classified: Secondary | ICD-10-CM | POA: Diagnosis not present

## 2019-01-01 DIAGNOSIS — R4689 Other symptoms and signs involving appearance and behavior: Secondary | ICD-10-CM | POA: Diagnosis not present

## 2019-01-01 DIAGNOSIS — R4182 Altered mental status, unspecified: Secondary | ICD-10-CM | POA: Diagnosis present

## 2019-01-01 DIAGNOSIS — F319 Bipolar disorder, unspecified: Secondary | ICD-10-CM | POA: Diagnosis not present

## 2019-01-01 DIAGNOSIS — I10 Essential (primary) hypertension: Secondary | ICD-10-CM | POA: Diagnosis not present

## 2019-01-01 DIAGNOSIS — Z8673 Personal history of transient ischemic attack (TIA), and cerebral infarction without residual deficits: Secondary | ICD-10-CM | POA: Insufficient documentation

## 2019-01-01 LAB — COMPREHENSIVE METABOLIC PANEL
ALT: 33 U/L (ref 0–44)
AST: 38 U/L (ref 15–41)
Albumin: 4.7 g/dL (ref 3.5–5.0)
Alkaline Phosphatase: 118 U/L (ref 38–126)
Anion gap: 10 (ref 5–15)
BUN: 14 mg/dL (ref 8–23)
CO2: 27 mmol/L (ref 22–32)
Calcium: 9.8 mg/dL (ref 8.9–10.3)
Chloride: 99 mmol/L (ref 98–111)
Creatinine, Ser: 0.8 mg/dL (ref 0.44–1.00)
GFR calc Af Amer: >60 mL/min (ref >60)
GFR calc non Af Amer: >60 mL/min (ref >60)
Glucose, Bld: 109 mg/dL — ABNORMAL HIGH (ref 70–99)
Potassium: 4.3 mmol/L (ref 3.5–5.1)
Sodium: 136 mmol/L (ref 135–145)
Total Bilirubin: 1 mg/dL (ref 0.3–1.2)
Total Protein: 8.2 g/dL — ABNORMAL HIGH (ref 6.5–8.1)

## 2019-01-01 LAB — CBC WITH DIFFERENTIAL/PLATELET
Abs Immature Granulocytes: 0.02 10*3/uL (ref 0.00–0.07)
Basophils Absolute: 0.1 10*3/uL (ref 0.0–0.1)
Basophils Relative: 1 %
Eosinophils Absolute: 0.2 10*3/uL (ref 0.0–0.5)
Eosinophils Relative: 3 %
HCT: 45.8 % (ref 36.0–46.0)
Hemoglobin: 15.3 g/dL — ABNORMAL HIGH (ref 12.0–15.0)
Immature Granulocytes: 0 %
Lymphocytes Relative: 27 %
Lymphs Abs: 1.7 10*3/uL (ref 0.7–4.0)
MCH: 30.7 pg (ref 26.0–34.0)
MCHC: 33.4 g/dL (ref 30.0–36.0)
MCV: 91.8 fL (ref 80.0–100.0)
Monocytes Absolute: 0.5 10*3/uL (ref 0.1–1.0)
Monocytes Relative: 8 %
Neutro Abs: 4 10*3/uL (ref 1.7–7.7)
Neutrophils Relative %: 61 %
Platelets: 298 10*3/uL (ref 150–400)
RBC: 4.99 MIL/uL (ref 3.87–5.11)
RDW: 13.4 % (ref 11.5–15.5)
WBC: 6.4 10*3/uL (ref 4.0–10.5)
nRBC: 0 % (ref 0.0–0.2)

## 2019-01-01 LAB — URINALYSIS, ROUTINE W REFLEX MICROSCOPIC
Bilirubin Urine: NEGATIVE
Glucose, UA: NEGATIVE mg/dL
Hgb urine dipstick: NEGATIVE
Ketones, ur: NEGATIVE mg/dL
Leukocytes,Ua: NEGATIVE
Nitrite: NEGATIVE
Protein, ur: NEGATIVE mg/dL
Specific Gravity, Urine: 1.003 — ABNORMAL LOW (ref 1.005–1.030)
pH: 7 (ref 5.0–8.0)

## 2019-01-01 LAB — URINE DRUG SCREEN, QUALITATIVE (ARMC ONLY)
Amphetamines, Ur Screen: NOT DETECTED
Barbiturates, Ur Screen: NOT DETECTED
Benzodiazepine, Ur Scrn: NOT DETECTED
Cannabinoid 50 Ng, Ur ~~LOC~~: NOT DETECTED
Cocaine Metabolite,Ur ~~LOC~~: NOT DETECTED
MDMA (Ecstasy)Ur Screen: NOT DETECTED
Methadone Scn, Ur: NOT DETECTED
Opiate, Ur Screen: NOT DETECTED
Phencyclidine (PCP) Ur S: NOT DETECTED
Tricyclic, Ur Screen: NOT DETECTED

## 2019-01-01 LAB — TSH: TSH: 7.098 u[IU]/mL — ABNORMAL HIGH (ref 0.350–4.500)

## 2019-01-01 LAB — SALICYLATE LEVEL: Salicylate Lvl: <7 mg/dL (ref 2.8–30.0)

## 2019-01-01 LAB — ETHANOL: Alcohol, Ethyl (B): <10 mg/dL (ref <10)

## 2019-01-01 LAB — T4, FREE: Free T4: 0.85 ng/dL (ref 0.61–1.12)

## 2019-01-01 LAB — ACETAMINOPHEN LEVEL: Acetaminophen (Tylenol), Serum: <10 ug/mL — ABNORMAL LOW (ref 10–30)

## 2019-01-01 MED ORDER — DROPERIDOL 2.5 MG/ML IJ SOLN
5.0000 mg | Freq: Once | INTRAMUSCULAR | Status: AC
  Administered 2019-01-01: 5 mg via INTRAMUSCULAR

## 2019-01-01 NOTE — ED Notes (Addendum)
Belongings: Cell phone and Charger x 3 Black purse Personal hygiene products Blue bible Assorted keys Bag of sun glasses Knee brace Medication with daily pill box Small wallet with credit cards, ID card and $53.00 in bills (In valuables envelope in Safe) Loose change  Dark colored wooden Cane Black socks White poka dot robe [employer] Long sleeve top

## 2019-01-01 NOTE — ED Provider Notes (Addendum)
[facility] Emergency Department Provider Note  ____________________________________________   None    (approximate)  I have reviewed the triage vital signs and the nursing notes.   HISTORY  Chief Complaint Altered Mental Status and Agitation  Level 5 caveat:  history/ROS limited by cooperation, combativeness, and concern for acute psychiatric illness.  HPI Angela [name] is a 68 y.o. female with medical history as listed below.   She presents tonight by EMS after she called 911 to report that she was having a stroke.  EMS reports that when they arrived, she picked up her walker and pushed passed them and walked out to the ambulance to be loaded.  Once they put her in the ambulance she stopped speaking to them, closed her eyes, and went limp and would not respond, give her name, signed the paperwork, etc.  Upon arrival to the emergency department she continues to be unresponsive.  I was at the room and bedside when she arrived.  She was transferred from the EMS stretcher to the ED exam bed.  She was not responding to anything anyone was saying or doing although she did seem to be actively trying to keep her eyes shut when the nurse was asking her to open her eyelids so that she could look at her pupils.  See ED course for additional details.      Past Medical History:  Diagnosis Date  . Allergic rhinitis   . Anemia   . Blurred vision   . Depression   . Diverticulosis   . Endometriosis   . Falls   . GERD (gastroesophageal reflux disease)   . Hernia, hiatal   . Hypothyroidism   . IBS (irritable bowel syndrome)   . Malignant neoplasm of skin   . Migraine   . Neuropathy   . PTSD (post-traumatic stress disorder)   . Thyroid disease     Patient Active Problem List   Diagnosis Date Noted  . Altered mental status, unspecified 12/25/2018  . Confusion state 12/25/2018  . Abnormal TSH 12/25/2018  . Acquired claw toe of both feet 10/23/2018  .  Dermatitis 10/22/2018  . Cough productive of yellow sputum 08/08/2018  . Malnutrition of mild degree ([place]) 08/08/2018  . Mixed stress and urge urinary incontinence 08/08/2018  . Magnesium deficiency 08/08/2018  . Aortic atherosclerosis ([place]) 05/31/2018  . Chest pain of uncertain etiology [id number]  . Achilles tendon disorder 04/25/2018  . Insomnia 04/25/2018  . Diastolic dysfunction [id number]  . Mitral regurgitation 03/29/2018  . Bronchospasm 03/15/2018  . Axis II diagnosis deferred 02/20/2018  . Preventative health care 02/08/2018  . Polypharmacy 02/01/2018  . Personality disorder ([place]) 12/25/2017  . Noncompliance 12/25/2017  . Medication monitoring encounter 12/07/2017  . Family history of high cholesterol 12/07/2017  . Arthritis of hand, degenerative 12/07/2017  . Bipolar I disorder, most recent episode mixed, severe with psychotic features ([place]) 09/18/2017  . Agitation 09/18/2017  . Depressed mood 07/11/2017  . Hallux valgus, acquired 07/05/2017  . Hammer toes of both feet 07/05/2017  . Unstable ankle 07/05/2017  . Chronic bilateral low back pain without sciatica 06/09/2017  . Tinea unguium 05/08/2017  . Conductive hearing loss of right ear with unrestricted hearing of left ear 04/03/2017  . Adjustment disorder with depressed mood 01/18/2017  . Migraine 01/18/2017  . IBS (irritable bowel syndrome) 11/17/2016  . Bilateral carpal tunnel syndrome 11/17/2016  . Plantar fasciitis, bilateral 11/17/2016  . Cervical myofascial pain syndrome 11/17/2016  . Chronic hyponatremia 11/17/2016  .  PTSD (post-traumatic stress disorder) 11/17/2016  . Hiatal hernia 11/17/2016  . Vitamin D deficiency 11/17/2016  . Vitamin B12 deficiency 11/17/2016  . SIADH (syndrome of inappropriate ADH production) ([place]) 09/08/2016  . Cervical spondylosis with myelopathy 05/24/2016  . MCI (mild cognitive impairment) with memory loss 03/30/2016  . Gait abnormality 03/30/2016  . Chronic bipolar affective  disorder ([place]) 01/24/2016  . Recurrent major depressive disorder, in partial remission ([place]) 01/24/2016  . Anemia, iron deficiency 06/24/2015  . Benign essential HTN 06/24/2015  . History of falling 05/22/2015  . History of TIA (transient ischemic attack) 04/20/2015  . Hypothyroidism 04/20/2015  . GERD (gastroesophageal reflux disease) 04/20/2015  . Cervical disc disorder at C5-C6 level with radiculopathy 03/25/2015  . Chronic neck pain 03/25/2015  . Pelvic pain in female 10/30/2014  . History of skin cancer 06/30/2014  . Chronic sinusitis 06/30/2014  . Chronic headaches 12/16/2013  . Neuropathy 12/16/2013    Past Surgical History:  Procedure Laterality Date  . APPENDECTOMY    . CHOLECYSTECTOMY    . CHOLECYSTECTOMY, LAPAROSCOPIC    . ESOPHAGOGASTRODUODENOSCOPY (EGD) WITH PROPOFOL N/A 03/29/2015   Procedure: ESOPHAGOGASTRODUODENOSCOPY (EGD) WITH PROPOFOL;  Surgeon: Josefine Class, MD;  Location: [facility] ENDOSCOPY;  Service: Endoscopy;  Laterality: N/A;  . HERNIA REPAIR    . laproscopy    . TONSILLECTOMY    . uteral suspension      Prior to Admission medications   Medication Sig Start Date End Date Taking? Authorizing Provider  acetaminophen (TYLENOL) 650 MG CR tablet Take 650 mg by mouth every 8 (eight) hours as needed for pain.    [provider]  Acetylcysteine (NAC) 500 MG CAPS Take 500 mg by mouth 2 (two) times a day.    [provider]  albuterol (VENTOLIN HFA) 108 (90 Base) MCG/ACT inhaler Inhale 2 puffs into the lungs every 4 (four) hours as needed for wheezing or shortness of breath. 11/27/18   Crecencio Mc, MD  Amino Acids-Protein Hydrolys (FEEDING SUPPLEMENT, PRO-STAT 64,) LIQD Take 30 mLs by mouth daily. 04/12/18   Poulose, Bethel Born, NP  atorvastatin (LIPITOR) 20 MG tablet Take 1 tablet (20 mg total) by mouth at bedtime. 11/26/18   Crecencio Mc, MD  Biotin 10000 MCG TABS Take 10,000 mcg by mouth daily.     [provider]  Calcium  Citrate (CITRACAL PO) Take 800 mg by mouth. Takes 2 (400mg ) tablets BID    [provider]  Cholecalciferol 25 MCG (1000 UT) tablet Take 1 tablet (1,000 Units total) by mouth daily. 12/26/17   Clapacs, Madie Reno, MD  Cyanocobalamin (VITAMIN B-12) 1000 MCG SUBL Place 1,000 mcg under the tongue.     [provider]  diclofenac sodium (VOLTAREN) 1 % GEL Apply small amount (2 grams) topically over knees or hands up to four times a day if needed for arthritis 11/26/18   Crecencio Mc, MD  estradiol (ESTRING) 2 MG vaginal ring Place 2 mg vaginally every 3 (three) months. follow package directions 10/29/18   Rubie Maid, MD  famotidine (PEPCID) 20 MG tablet Take 1 tablet (20 mg total) by mouth 2 (two) times daily as needed for heartburn or indigestion. 11/26/18   Crecencio Mc, MD  ferrous sulfate 325 (65 FE) MG tablet Take 1 tablet (325 mg total) by mouth daily with breakfast. 11/26/18   Crecencio Mc, MD  fluticasone (FLONASE) 50 MCG/ACT nasal spray Place 2 sprays into both nostrils daily.     [provider]  gabapentin (NEURONTIN) 100 MG capsule Take 100 mg by mouth 2 (two) times daily.    [provider]  hydrOXYzine (ATARAX/VISTARIL) 10 MG tablet TAKE 1 TO 2 TABLETS BY MOUTH ONCE DAILY AS NEEDED 02/05/18   [provider]  Icosapent Ethyl (VASCEPA) 1 g CAPS TAKE 2 CAPSULES BY MOUTH TWICE DAILY (TO HELP WITH TRIGLYCERIDES) 11/26/18   Crecencio Mc, MD  Lactulose 20 GM/30ML SOLN 30 ml every 4 hours until constipation is relieved 11/08/18   Crecencio Mc, MD  levalbuterol (XOPENEX) 0.63 MG/3ML nebulizer solution Take 3 mLs (0.63 mg total) by nebulization every 4 (four) hours as needed for wheezing or shortness of breath. 11/26/18   Crecencio Mc, MD  levothyroxine (SYNTHROID) 50 MCG tablet Take 1 tablet (50 mcg total) by mouth daily at 6 (six) AM. 12/28/18   Nolberto Hanlon, MD  linaclotide [facility]) 290 MCG CAPS capsule Take 1 capsule (290 mcg total) by  mouth daily before breakfast. 12/26/17   Clapacs, Madie Reno, MD  LORazepam (ATIVAN) 0.5 MG tablet TAKE 1 TABLET BY MOUTH AS NEEDED ONCE DAILY 02/06/18   [provider]  losartan (COZAAR) 50 MG tablet Take 1 tablet (50 mg total) by mouth daily. 11/26/18 02/24/19  Crecencio Mc, MD  Magnesium 500 MG TABS Take 500 mg by mouth every other day.    [provider]  Multiple Vitamin (MULTIVITAMIN WITH MINERALS) TABS tablet Take 1 tablet by mouth daily. 12/26/17   Clapacs, Madie Reno, MD  ondansetron (ZOFRAN ODT) 4 MG disintegrating tablet Allow 1-2 tablets to dissolve in your mouth every 8 hours as needed for nausea/vomiting 12/04/18   Hinda Kehr, MD  PARoxetine (PAXIL) 20 MG tablet Take 20 mg by mouth daily.    [provider]  prednisoLONE acetate (PRED FORTE) 1 % ophthalmic suspension INSTILL 1 DROP INTO EACH EYE TWICE DAILY 07/26/18   [provider]  pregabalin (LYRICA) 75 MG capsule Take 75 mg by mouth 2 (two) times daily.     [provider]  PREMARIN vaginal cream Place 1 Applicatorful vaginally 2 (two) times a week. 11/28/18   Crecencio Mc, MD  pyridoxine (B-6) 100 MG tablet Take 1 tablet (100 mg total) by mouth 2 (two) times daily. 12/26/17   Clapacs, Madie Reno, MD  tiZANidine (ZANAFLEX) 2 MG tablet Take by mouth every 6 (six) hours as needed for muscle spasms.    [provider]  traZODone (DESYREL) 50 MG tablet Take 1 tablet (50 mg total) by mouth at bedtime as needed. Pt takes 1/2 tablet qhs 11/26/18   Crecencio Mc, MD  triamcinolone ointment (KENALOG) 0.5 % Apply 1 application topically 2 (two) times daily. 11/26/18   Crecencio Mc, MD  umeclidinium bromide (INCRUSE ELLIPTA) 62.5 MCG/INH AEPB Inhale 1 puff into the lungs daily. 11/26/18   Crecencio Mc, MD  XIIDRA 5 % SOLN INSTILL 1 DROP INTO EACH EYE TWICE DAILY 07/27/18   [provider]    Allergies Advair diskus [fluticasone-salmeterol], Bee venom, Darvon [propoxyphene],  Dicyclomine, Other, Oxycodone-acetaminophen, Peanuts [peanut oil], Penicillins, Sulfa antibiotics, Sulfacetamide sodium, Ultram [tramadol], Amikacin, Aspirin, Haloperidol, Hymenoptera venom preparations, Imipramine, Keflex [cephalexin], Lactose, Lactose intolerance (gi), Risperdal [risperidone], Sulfur, Adhesive [tape], Albuterol, Ciprofloxacin, Hydroxyzine, Peanut butter flavor, and Prednisone  Family History  Problem Relation Age of Onset  . Heart disease Father   . Hearing loss Father   . COPD Father   . Depression Father   . Stroke Father   . Vision  loss Father   . Varicose Veins Father   . Cancer Mother        lung  . Depression Sister   . Arthritis Sister   . Arthritis Brother   . Hernia Brother   . Anxiety disorder Brother   . Arthritis Brother   . Hernia Brother   . Anxiety disorder Brother   . Urolithiasis Neg Hx   . Kidney disease Neg Hx   . Kidney cancer Neg Hx   . Prostate cancer Neg Hx     Social History Social History   Tobacco Use  . Smoking status: Former Smoker    Quit date: 11/17/1976    Years since quitting: 42.1  . Smokeless tobacco: Never Used  Substance Use Topics  . Alcohol use: Yes    Comment: occ  . Drug use: No    Review of Systems Level 5 caveat:  history/ROS limited by cooperation, combativeness, and concern for acute psychiatric illness.  ____________________________________________   PHYSICAL EXAM:  ED Triage Vitals  Enc Vitals Group     BP 01/01/19 0440 (!) 190/86     Pulse Rate 01/01/19 0440 75     Resp 01/01/19 0440 18     Temp 01/01/19 0440 98.4 F (36.9 C)     Temp Source 01/01/19 0440 Oral     SpO2 01/01/19 0440 100 %     Weight 01/01/19 0441 64 kg (141 lb 1.5 oz)     Height 01/01/19 0441 1.702 m (5\' 7" )     Head Circumference --      Peak Flow --      Pain Score 01/01/19 0440 0     Pain Loc --      Pain Edu? --      Excl. in [place]? --     Constitutional: Initially unresponsive, then acutely responsive after stimulus  as listed below. Eyes: Conjunctivae are normal.  Pupils appear equal and reactive when she opened her eyes but she will not allow for a full exam. Head: Atraumatic. Nose: No congestion/rhinnorhea. Mouth/Throat: Patient is wearing a mask. Neck: No stridor.  No meningeal signs.   Cardiovascular: Normal rate, regular rhythm. Good peripheral circulation. Grossly normal heart sounds. Respiratory: Normal respiratory effort.  No retractions. Gastrointestinal: Patient will not allow exam. Musculoskeletal: No lower extremity tenderness nor edema. No gross deformities of extremities. Neurologic: Patient was initially unresponsive, not reacting to any verbal or physical stimulus by the ED staff or paramedics.  After application of light sternal rub the patient became fully alert and oriented and very upset and combative. Skin:  Skin is warm, dry and intact to the best of my ability to assess given limitations placed by the patient. Psychiatric: Mood and affect are labile, initially unresponsive and then aggressive and combative.  Accusing me of being the Devil, claiming that staff were hitting her, unable to redirect or conduct more than a cursory exam.  ____________________________________________   LABS (all labs ordered are listed, but only abnormal results are displayed)  Labs Reviewed  CBC WITH DIFFERENTIAL/PLATELET - Abnormal; Notable for the following components:      Result Value   Hemoglobin 15.3 (*)    All other components within normal limits  URINALYSIS, ROUTINE W REFLEX MICROSCOPIC - Abnormal; Notable for the following components:   Color, Urine COLORLESS (*)    APPearance CLEAR (*)    Specific Gravity, Urine 1.003 (*)    All other components within normal limits  ACETAMINOPHEN LEVEL - Abnormal; Notable  for the following components:   Acetaminophen (Tylenol), Serum <10 (*)    All other components within normal limits  ETHANOL  SALICYLATE LEVEL  COMPREHENSIVE METABOLIC PANEL  TSH    URINE DRUG SCREEN, QUALITATIVE ([facility] ONLY)  T4, FREE   ____________________________________________  EKG  Patient will not allow for emergent EKG tonight, but a review of prior EKG demonstrates normal QTc interval. ____________________________________________  RADIOLOGY Ursula Alert, personally viewed and evaluated these images (plain radiographs) as part of my medical decision making, as well as reviewing the written report by the radiologist.  ED MD interpretation: No indication of emergent imaging  Official radiology report(s): No results found.  ____________________________________________   PROCEDURES   Procedure(s) performed (including Critical Care):  .Critical Care Performed by: Hinda Kehr, MD Authorized by: Hinda Kehr, MD   Critical care provider statement:    Critical care time (minutes):  30   Critical care time was exclusive of:  Separately billable procedures and treating other patients   Critical care was necessary to treat or prevent imminent or life-threatening deterioration of the following conditions: psychiatric/behavioral emergency.   Critical care was time spent personally by me on the following activities:  Development of treatment plan with patient or surrogate, discussions with consultants, evaluation of patient's response to treatment, examination of patient, obtaining history from patient or surrogate, ordering and performing treatments and interventions, ordering and review of laboratory studies, ordering and review of radiographic studies, pulse oximetry, re-evaluation of patient's condition and review of old charts     ____________________________________________   [place] / MDM / [place] / ED COURSE  As part of my medical decision making, I reviewed the following data within the [place] notes reviewed and incorporated, Labs reviewed , Old EKG reviewed, Old chart reviewed, A consult  was requested and obtained from this/these consultant(s) (Psychiatry) and reviewed Notes from prior ED visits   Differential diagnosis includes, but is not limited to, factitious disorder, malingering, conversion disorder, CVA, essential hypertension, hypertensive emergency.  Upon arrival I was concerned that the patient had called 911 for possible stroke and then become unresponsive.  Though it was reassuring to know that she walked to the ambulance, her initial presentation was quite concerning.  As stated above, she was completely unresponsive and not reacting to the staff's attempts to evaluate her and settle her into the ED exam bed.  Upon application of a gentle sternal rub, the patient became a completely different person, combative, violent, attempted to hit me in the face to the point that I had to deflect the strike from hitting me.  She seemed very paranoid at this point and began yelling and claiming that the staff was hitting her, that I was hitting her, and again referring to me as "the Devil".    It is unclear at this time whether this was an acute psychotic break (geropsychiatric issue), whether this is factitious disorder, personality disorder, acute medical issue, etc. regardless she is combative and presenting an immediate danger to the staff and possibly to herself and refusing to allow [place] to clear her medically.  At that moment I was faced with the difficult decision of whether or not she has the capacity to make her own decisions and refuse treatment, but based on the behavior exhibited thus far, it is my medical opinion that she lacks capacity and would benefit from psychiatric consultation.  I have placed the patient under involuntary commitment for the reasons described above  and given my concern for lack of capacity.   Given her behavior and the danger she represents others, I have ordered droperidol 5 mg intramuscular injection which should be a safe calming agent; my  preference would be to avoid medications on the beers criteria list such as benzodiazepines although it may prove necessary to use relatively low doses of Versed if she continues to be combative and agitated.  After this initial assessment I had the opportunity to review the medical record in extensive detail.  I see that there has been a long history of various psychiatric issues including documented history of bipolar disorder, personality disorder, and PTSD.  It could be that she is having a psychotic break, or that she is perceiving our attempts to help her as an assault and triggering her PTSD.  There are also multiple notes from doctors including her primary care doctor, Dr. Derrel Nip, and her cardiologist, Dr. Rockey Situ, as well as from the patient's own report that she has fired multiple doctors from her care, and in turn that she has been fired by other providers such as the 2 previously listed.  In particular, the note from Dr. Derrel Nip indicates that the patient was fired from her clinic for "harassment".  It could be that this behavior and psychiatric illness has been building for some time.  Of note, I saw the patient almost exactly a month ago for chest pain.  We had several extensive conversations with no animosity.  She felt better after talking to me and at the time even mentioned that she was glad there was "finally a good doctor in [facility]", and at the time she mentioned a number of issues with other providers in the past including Dr. Derrel Nip and Dr. Rockey Situ.  What I am seeing in her tonight represents an acute change from what I saw a month ago.  I also saw that she was just recently admitted to the hospital for confusion and concerns of having a stroke.  She received extensive evaluation from multiple specialists.  It was determined that she did not have a stroke and a note was made on the day of discharge that she was very angry and reportedly refused to leave for a while.  This brings up the  possibility of factitious disorder or malingering but still does not explain the extreme behavior being exhibited tonight.  I do feel somewhat reassured that she has had a very recent extensive medical work-up including MRI of the brain and I think the possibility of an acute stroke or other emergent medical condition is unlikely and that the symptoms tonight are likely psychiatric in nature although the specific diagnosis is unclear at this time.  If the patient becomes more calm and cooperative after calming agents to the point we can safely draw blood, I think that would be appropriate.  I think it is unlikely she would benefit from additional imaging of her head, either by CT or MRI.  I will order a consult to psychiatry once she has calmed from the initial acute phase.  She is being moved to the behavioral side of the emergency department for further evaluation and assessment.  Of note, the patient has a listed allergy to haloperidol of "nausea and vomiting".  This is unlikely to be a true allergy given that haloperidol and droperidol are both powerful antiemetics and I authorize the use of droperidol as it has a good safety profile in general and specifically in her age group.  Clinical Course as of Dec 31 729  Wed Jan 01, 2019  0652 The patient allowed for blood draw, changing into burgundy scrubs, and moving to the ED quad.  I am transferring ED care to Dr. Jimmye Norman and will discuss the case in person with him after shift change at 7:00 AM.  It should be noted that the patient at no point has demonstrated any neurological deficits, no weakness in any extremity, no facial droop, which is all reassuring.  Again, I do not feel at this point that emergent CT is indicated, although if her clinical status changes that may change as well.   [CF]    Clinical Course User Index [CF] Hinda Kehr, MD     ____________________________________________  FINAL CLINICAL IMPRESSION(S) / ED  DIAGNOSES  Final diagnoses:  Transient alteration of awareness  Combative behavior  At risk for danger to others  Labile mood  Essential hypertension     MEDICATIONS GIVEN DURING THIS VISIT:  Medications  droperidol (INAPSINE) 2.5 MG/ML injection 5 mg (5 mg Intramuscular Given 01/01/19 0535)     ED Discharge Orders    None      *Please note:  Anderson Dannunzio was evaluated in Emergency Department on 01/01/2019 for the symptoms described in the history of present illness. She was evaluated in the context of the global COVID-19 pandemic, which necessitated consideration that the patient might be at risk for infection with the SARS-CoV-2 virus that causes COVID-19. Institutional protocols and algorithms that pertain to the evaluation of patients at risk for COVID-19 are in a state of rapid change based on information released by regulatory bodies including the CDC and federal and state organizations. These policies and algorithms were followed during the patient's care in the ED.  Some ED evaluations and interventions may be delayed as a result of limited staffing during the pandemic.*  Note:  This document was prepared using Dragon voice recognition software and may include unintentional dictation errors.   Hinda Kehr, MD 01/01/19 0730    Hinda Kehr, MD 01/01/19 [phone]

## 2019-01-01 NOTE — ED Notes (Addendum)
Report given to Dominica Severin, Therapist, sports. Patient slightly more agreeable but continues to threaten legal action against these providers. This RN was able to draw some blood from patient although she continued to move her arm while attempting to draw. Patient changed into burgundy scrubs, belongings placed in labeled bags. Patient wheeled to quad, belongings verified by Dominica Severin, RN and $53 dollars from patient's belongings were placed in folder for safety

## 2019-01-01 NOTE — ED Notes (Signed)
Patient still refusing to dress into hospital appropriate scrubs.  At this time patient resting under blanket, chest rise even and unlabored, skin WNL and appears in NAD.  Beth, EDT at bedside as 1:1 Air cabin crew and security with visual of room.  Patient refusing vital signs, will continue to monitor.

## 2019-01-01 NOTE — ED Notes (Signed)
Lunch tray provided. 

## 2019-01-01 NOTE — Progress Notes (Signed)
Per Bryson Ha request, chaplain provided spiritual care. Prayer was offered for which Angela [name] appeared to appreciate and engage with. Otherwise her behaviors were dismissive.    01/01/19 0500  Clinical Encounter Type  Visited With Patient  Visit Type Spiritual support  Referral From Patient  Spiritual Encounters  Spiritual Needs Prayer
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
## 2019-01-01 NOTE — BH Assessment (Signed)
Assessment Note  Angela [name] is an 68 y.o. female presenting to [facility] ED under IVC via EMS, triage note reported that patient was combative and agitated and reported that she is having a stroke. Per triage patient arrived asking for patient relations to file a formal complaint against the nurse in triage and attempted to hit EDP who came to her bedside. During assessment patient reported "they were harassing me" but did not specify who was harassing her. Patient reports "I'm not feeling good, I lost the motivation to live." Patient reports that she had a stroke "when I have low sodium and potassium it makes me irritable." Patient reports "I wish I could get my surgery taken care of." Patient reports that she is currently taking her medications for her mental health and denies any depression and SI.   Diagnosis: Personality Disorder by history  Past Medical History:  Past Medical History:  Diagnosis Date  . Allergic rhinitis   . Anemia   . Blurred vision   . Depression   . Diverticulosis   . Endometriosis   . Falls   . GERD (gastroesophageal reflux disease)   . Hernia, hiatal   . Hypothyroidism   . IBS (irritable bowel syndrome)   . Malignant neoplasm of skin   . Migraine   . Neuropathy   . PTSD (post-traumatic stress disorder)   . Thyroid disease     Past Surgical History:  Procedure Laterality Date  . APPENDECTOMY    . CHOLECYSTECTOMY    . CHOLECYSTECTOMY, LAPAROSCOPIC    . ESOPHAGOGASTRODUODENOSCOPY (EGD) WITH PROPOFOL N/A 03/29/2015   Procedure: ESOPHAGOGASTRODUODENOSCOPY (EGD) WITH PROPOFOL;  Surgeon: Josefine Class, MD;  Location: [facility] ENDOSCOPY;  Service: Endoscopy;  Laterality: N/A;  . HERNIA REPAIR    . laproscopy    . TONSILLECTOMY    . uteral suspension      Family History:  Family History  Problem Relation Age of Onset  . Heart disease Father   . Hearing loss Father   . COPD Father   . Depression Father   . Stroke Father   . Vision loss Father    . Varicose Veins Father   . Cancer Mother        lung  . Depression Sister   . Arthritis Sister   . Arthritis Brother   . Hernia Brother   . Anxiety disorder Brother   . Arthritis Brother   . Hernia Brother   . Anxiety disorder Brother   . Urolithiasis Neg Hx   . Kidney disease Neg Hx   . Kidney cancer Neg Hx   . Prostate cancer Neg Hx     Social History:  reports that she quit smoking about 42 years ago. She has never used smokeless tobacco. She reports current alcohol use. She reports that she does not use drugs.  Additional Social History:  Alcohol / Drug Use Pain Medications: see MAR Prescriptions: see MAR Over the Counter: see MAR History of alcohol / drug use?: No history of alcohol / drug abuse  CIWA: CIWA-Ar BP: (!) 158/76 Pulse Rate: 82 COWS:    Allergies:  Allergies  Allergen Reactions  . Advair Diskus [Fluticasone-Salmeterol] Shortness Of Breath  . Bee Venom Hives  . Darvon [Propoxyphene] Anaphylaxis and Hives    Hives/throat swelling   . Dicyclomine Itching  . Other Hives    Spider bites cause hives  . Oxycodone-Acetaminophen Nausea And Vomiting and Other (See Comments)    hallucinations   . Peanuts [Peanut  Oil] Anaphylaxis and Hives  . Penicillins Hives    Has patient had a PCN reaction causing immediate rash, facial/tongue/throat swelling, SOB or lightheadedness with hypotension: Unknown Has patient had a PCN reaction causing severe rash involving mucus membranes or skin necrosis: Unknown Has patient had a PCN reaction that required hospitalization: Unknown Has patient had a PCN reaction occurring within the last 10 years: Unknown If all of the above answers are "NO", then may proceed with Cephalosporin use.  . Sulfa Antibiotics Anaphylaxis, Hives and Itching  . Sulfacetamide Sodium Anaphylaxis, Hives and Itching  . Ultram [Tramadol] Hives  . Amikacin Nausea And Vomiting  . Aspirin Hives  . Haloperidol Nausea And Vomiting  . Hymenoptera Venom  Preparations Hives    Reaction to spider bites  . Imipramine Hives    Reaction to Tofranil  . Keflex [Cephalexin] Hives  . Lactose   . Lactose Intolerance (Gi)   . Risperdal [Risperidone]     Serotonin syndrome  . Sulfur Hives  . Adhesive [Tape] Hives and Rash  . Albuterol Palpitations    Tolerates levalbuterol  . Ciprofloxacin Other (See Comments)    tendonopathy  . Hydroxyzine Anxiety and Itching  . Peanut Butter Flavor Anxiety  . Prednisone Nausea And Vomiting, Anxiety and Other (See Comments)    Crazy mood swings, strange thoughts. Tolerates depomedrol     Home Medications: (Not in a hospital admission)   OB/GYN Status:  No LMP recorded. Patient is postmenopausal.  General Assessment Data Location of Assessment: [facility] ED TTS Assessment: In system Is this a Tele or Face-to-Face Assessment?: Face-to-Face Is this an Initial Assessment or a Re-assessment for this encounter?: Initial Assessment Patient Accompanied by:: N/A(Self) Language Other than English: No Living Arrangements: Other (Comment)(Private Residence) What gender do you identify as?: Female Marital status: Single Living Arrangements: Alone Can pt return to current living arrangement?: Yes Admission Status: Involuntary Petitioner: Police Is patient capable of signing voluntary admission?: No Referral Source: Other(Police department) Insurance type: Facilities manager Exam ([place]) Medical Exam completed: Yes  Crisis Care Plan Living Arrangements: Alone Legal Guardian: Other:(Pt is her own legal guardian) Name of Psychiatrist: None reported Name of Therapist: None reported  Education Status Is patient currently in school?: No Is the patient employed, unemployed or receiving disability?: Receiving disability income  Risk to self with the past 6 months Suicidal Ideation: No Has patient been a risk to self within the past 6 months prior to admission? : No Suicidal Intent:  No Has patient had any suicidal intent within the past 6 months prior to admission? : No Is patient at risk for suicide?: No Suicidal Plan?: No Has patient had any suicidal plan within the past 6 months prior to admission? : No Access to Means: No What has been your use of drugs/alcohol within the last 12 months?: None Previous Attempts/Gestures: No How many times?: 0 Other Self Harm Risks: None reported Triggers for Past Attempts: None known Intentional Self Injurious Behavior: None Family Suicide History: No Recent stressful life event(s): Other (Comment)(None reported) Persecutory voices/beliefs?: No Depression: No Depression Symptoms: (None reported) Substance abuse history and/or treatment for substance abuse?: No Suicide prevention information given to non-admitted patients: Not applicable  Risk to Others within the past 6 months Homicidal Ideation: No Does patient have any lifetime risk of violence toward others beyond the six months prior to admission? : No Thoughts of Harm to Others: No Current Homicidal Intent: No Current Homicidal Plan: No Access to Homicidal  Means: No Identified Victim: None reported History of harm to others?: No Assessment of Violence: None Noted Violent Behavior Description: None reported Does patient have access to weapons?: No Criminal Charges Pending?: No Does patient have a court date: No Is patient on probation?: No  Psychosis Hallucinations: None noted Delusions: Somatic  Mental Status Report Appearance/Hygiene: In scrubs Eye Contact: Good Motor Activity: Freedom of movement Speech: Soft Level of Consciousness: Alert Mood: Anxious Affect: Anxious Anxiety Level: Moderate Thought Processes: Coherent Judgement: Unimpaired Orientation: Person, Place, Time, Situation, Appropriate for developmental age Obsessive Compulsive Thoughts/Behaviors: None  Cognitive Functioning Concentration: Normal Memory: Recent Intact, Remote  Intact Is patient IDD: No Insight: Fair Impulse Control: Fair Appetite: Poor Have you had any weight changes? : Loss Amount of the weight change? (lbs): 15 lbs Sleep: No Change Total Hours of Sleep: 5 Vegetative Symptoms: None  ADLScreening [facility] Assessment Services) Patient's cognitive ability adequate to safely complete daily activities?: Yes Patient able to express need for assistance with ADLs?: Yes Independently performs ADLs?: Yes (appropriate for developmental age)  Prior Inpatient Therapy Prior Inpatient Therapy: No  Prior Outpatient Therapy Prior Outpatient Therapy: No Does patient have an ACCT team?: No Does patient have Intensive In-House Services?  : No Does patient have [facility] services? : No Does patient have P4CC services?: No  ADL Screening (condition at time of admission) Patient's cognitive ability adequate to safely complete daily activities?: Yes Is the patient deaf or have difficulty hearing?: No Does the patient have difficulty seeing, even when wearing glasses/contacts?: No Does the patient have difficulty concentrating, remembering, or making decisions?: No Patient able to express need for assistance with ADLs?: Yes Does the patient have difficulty dressing or bathing?: No Independently performs ADLs?: Yes (appropriate for developmental age) Does the patient have difficulty walking or climbing stairs?: No Weakness of Legs: None Weakness of Arms/Hands: None  Home Assistive Devices/Equipment Home Assistive Devices/Equipment: None  Therapy Consults (therapy consults require a physician order) PT Evaluation Needed: No OT Evalulation Needed: No SLP Evaluation Needed: No Abuse/Neglect Assessment (Assessment to be complete while patient is alone) Physical Abuse: Denies Verbal Abuse: Denies Sexual Abuse: Denies Exploitation of patient/patient's resources: Denies Self-Neglect: Denies Values / Beliefs Cultural Requests During Hospitalization:  None Spiritual Requests During Hospitalization: None Consults Spiritual Care Consult Needed: No Transition of Care Team Consult Needed: No Advance Directives (For Healthcare) Does Patient Have a Medical Advance Directive?: No Would patient like information on creating a medical advance directive?: No - Patient declined       Child/Adolescent Assessment Running Away Risk: Denies(Pt is an adult)  Disposition: Per Psyc MD patient to be discharged Disposition Initial Assessment Completed for this Encounter: Yes  On Site Evaluation by:   Reviewed with Physician:    Leonie [name] MS LCAS-A 01/01/2019 2:49 PM

## 2019-01-01 NOTE — ED Notes (Signed)
Pt found out of bed sitting on bench in room with monitoring cords dangling from patient.  This RN sitting with patient attempting to ask patient if she is okay and if there is anything this RN could do to assist patient.  Patient rambling without letting this RN say anything else.  Patient is being verbally aggressive and cussing at this RN.  Chaplain then arrived to room to offer assistance to patient.  Patient cussing at and berating chaplain without warning.  This RN and chaplain, Melissa, remaining calm while attempting to converse with patient.

## 2019-01-01 NOTE — ED Notes (Signed)
Patient has been IVC'd by EDP at this time and primary RN also at bedside with hospital appropriate scrubs for patient to change in to.  Patient has been informed that she is under IVC and what that entails and calmly asked to change into provided scrubs.  Meanwhile patient still agitated and confabulating about events since arriving.  Patient threatening to leave and refusing to cooperate with staff.  When attempting to procure patient's belongings into patient belonging's bag the patient starts yelling she'll record this and proceeds to open camera on phone.  Patient is now calling 911 from hospital room on her personal cell phone.  [facility] security as well as [employer] officer at bedside at this time.  [employer] officer asking patient what she can do for her, while patient continues to stay on phone ignoring officer.  Patient thoughts seem paranoid, tangential, and blaming others.

## 2019-01-01 NOTE — ED Triage Notes (Signed)
Patient arrives via EMS combative and agitated, says she is having a stroke and her BP is high. Patient arrives asking for patient relations to file a formal complaint against this RN and others assisting care. Attempted to hit EDP, who came to bedside

## 2019-01-01 NOTE — ED Notes (Signed)
Assumed care of patient this morning patient in bed with covers over head, answers to name, reports she wants to go visit a friend while she is down here today. Denies SI/HV/SI. Patient states brought to ed because she was having a stroke and has high blood pressure. Awaiting psych eval this morning.

## 2019-01-01 NOTE — ED Notes (Signed)
Cab voucher provided. Patient escorted to cab via w/c upon discharge.

## 2019-01-01 NOTE — ED Provider Notes (Signed)
Patient has been evaluated by psychiatry and is well-known to the psychiatry service.  She has been cleared for discharge.   Earleen Newport, MD 01/01/19 [phone]

## 2019-01-02 DIAGNOSIS — Z87891 Personal history of nicotine dependence: Secondary | ICD-10-CM | POA: Diagnosis not present

## 2019-01-02 DIAGNOSIS — M419 Scoliosis, unspecified: Secondary | ICD-10-CM | POA: Diagnosis not present

## 2019-01-02 DIAGNOSIS — G43909 Migraine, unspecified, not intractable, without status migrainosus: Secondary | ICD-10-CM | POA: Diagnosis not present

## 2019-01-02 DIAGNOSIS — I081 Rheumatic disorders of both mitral and tricuspid valves: Secondary | ICD-10-CM | POA: Diagnosis not present

## 2019-01-02 DIAGNOSIS — K219 Gastro-esophageal reflux disease without esophagitis: Secondary | ICD-10-CM | POA: Diagnosis not present

## 2019-01-02 DIAGNOSIS — Z8673 Personal history of transient ischemic attack (TIA), and cerebral infarction without residual deficits: Secondary | ICD-10-CM | POA: Diagnosis not present

## 2019-01-02 DIAGNOSIS — K5790 Diverticulosis of intestine, part unspecified, without perforation or abscess without bleeding: Secondary | ICD-10-CM | POA: Diagnosis not present

## 2019-01-02 DIAGNOSIS — I700 Atherosclerosis of aorta: Secondary | ICD-10-CM | POA: Diagnosis not present

## 2019-01-02 DIAGNOSIS — K589 Irritable bowel syndrome without diarrhea: Secondary | ICD-10-CM | POA: Diagnosis not present

## 2019-01-02 DIAGNOSIS — Z9181 History of falling: Secondary | ICD-10-CM | POA: Diagnosis not present

## 2019-01-02 DIAGNOSIS — K449 Diaphragmatic hernia without obstruction or gangrene: Secondary | ICD-10-CM | POA: Diagnosis not present

## 2019-01-02 DIAGNOSIS — G629 Polyneuropathy, unspecified: Secondary | ICD-10-CM | POA: Diagnosis not present

## 2019-01-02 DIAGNOSIS — E039 Hypothyroidism, unspecified: Secondary | ICD-10-CM | POA: Diagnosis not present

## 2019-01-02 DIAGNOSIS — J449 Chronic obstructive pulmonary disease, unspecified: Secondary | ICD-10-CM | POA: Diagnosis not present

## 2019-01-03 ENCOUNTER — Telehealth: Payer: Self-pay | Admitting: Cardiovascular Disease

## 2019-01-03 NOTE — Telephone Encounter (Signed)
Please call to discuss signing home health orders.

## 2019-01-03 NOTE — Telephone Encounter (Signed)
Spoke with Colletta [place] at [facility] and she wanted to confirm if orders are needed would Dr. Rockey Situ be able to sign them. Reviewed that we could sign for services but not equipment, supplies, or wound care. She verbalized understanding with no further questions at this time.

## 2019-01-06 ENCOUNTER — Telehealth: Payer: Self-pay | Admitting: Cardiovascular Disease

## 2019-01-06 DIAGNOSIS — E039 Hypothyroidism, unspecified: Secondary | ICD-10-CM | POA: Diagnosis not present

## 2019-01-06 DIAGNOSIS — Z87891 Personal history of nicotine dependence: Secondary | ICD-10-CM | POA: Diagnosis not present

## 2019-01-06 DIAGNOSIS — Z8673 Personal history of transient ischemic attack (TIA), and cerebral infarction without residual deficits: Secondary | ICD-10-CM | POA: Diagnosis not present

## 2019-01-06 DIAGNOSIS — G629 Polyneuropathy, unspecified: Secondary | ICD-10-CM | POA: Diagnosis not present

## 2019-01-06 DIAGNOSIS — G43909 Migraine, unspecified, not intractable, without status migrainosus: Secondary | ICD-10-CM | POA: Diagnosis not present

## 2019-01-06 DIAGNOSIS — I081 Rheumatic disorders of both mitral and tricuspid valves: Secondary | ICD-10-CM | POA: Diagnosis not present

## 2019-01-06 DIAGNOSIS — K219 Gastro-esophageal reflux disease without esophagitis: Secondary | ICD-10-CM | POA: Diagnosis not present

## 2019-01-06 DIAGNOSIS — K5790 Diverticulosis of intestine, part unspecified, without perforation or abscess without bleeding: Secondary | ICD-10-CM | POA: Diagnosis not present

## 2019-01-06 DIAGNOSIS — M419 Scoliosis, unspecified: Secondary | ICD-10-CM | POA: Diagnosis not present

## 2019-01-06 DIAGNOSIS — K589 Irritable bowel syndrome without diarrhea: Secondary | ICD-10-CM | POA: Diagnosis not present

## 2019-01-06 DIAGNOSIS — Z9181 History of falling: Secondary | ICD-10-CM | POA: Diagnosis not present

## 2019-01-06 DIAGNOSIS — I700 Atherosclerosis of aorta: Secondary | ICD-10-CM | POA: Diagnosis not present

## 2019-01-06 DIAGNOSIS — K449 Diaphragmatic hernia without obstruction or gangrene: Secondary | ICD-10-CM | POA: Diagnosis not present

## 2019-01-06 DIAGNOSIS — J449 Chronic obstructive pulmonary disease, unspecified: Secondary | ICD-10-CM | POA: Diagnosis not present

## 2019-01-06 NOTE — Telephone Encounter (Signed)
Patient calling  Patient would like to speak with Dr Rockey Situ, states it is urgent  Would like to discuss some drug interactions, drug names not given when asked and states that BP was up to 200 last week Please call to discuss

## 2019-01-07 ENCOUNTER — Telehealth: Payer: Self-pay | Admitting: Cardiovascular Disease

## 2019-01-07 DIAGNOSIS — M7918 Myalgia, other site: Secondary | ICD-10-CM | POA: Diagnosis not present

## 2019-01-07 DIAGNOSIS — M47812 Spondylosis without myelopathy or radiculopathy, cervical region: Secondary | ICD-10-CM | POA: Diagnosis not present

## 2019-01-07 DIAGNOSIS — G5603 Carpal tunnel syndrome, bilateral upper limbs: Secondary | ICD-10-CM | POA: Diagnosis not present

## 2019-01-07 NOTE — Telephone Encounter (Signed)
Attempted to reach the patient. The voicemail was full so was unable to leave a message.

## 2019-01-07 NOTE — Telephone Encounter (Signed)
I spoke with the patient. She is adamant that she speak with Dr. Rockey Situ directly. She is concerned about various drug interactions (Lipitor, Estring, losartan, etc.) She is concerned about her GI issues and a hernia pressing on her heart and giving her cardiac pain. She is concerned about her elevated BP- last week she was [id number] systolic. She has had multiple ER visits recently. I inquired what her BP has been at home- she states up and down but would not give me numbers. She was upset that she had to answer questions from the receptionist and then the nurse, when the doctor would ask her the same questions.   I offered her an in person visit tomorrow with Laurann Montana, NP- she initially refused, but then allowed me to give her appt times, she denied that she could make an 8:30 am, 9:30 am, or 3:30 pm slot.  I have advised her that many of her issues sound primary care related. She tells me that Dr. Derrel Nip is refusing to see her- "Dr. 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 She is moving at the end of January and needs to "urgently" speak to Dr. Rockey Situ.  I advised the patient I could not guarantee he could call her back tonight or tomorrow as he is in the hospital.  I asked her if she understands this and that she was also offered an in office visit tomorrow with our clinic, but refused. The patient states she is aware of this.   I advised I will forward to Dr. Rockey Situ at her request and asks that he call her.

## 2019-01-07 NOTE — Telephone Encounter (Signed)
Patient only wants to speak to nurse or doctor about a drug interaction. Will not give any more information. States she has continuously called and does not feel like her doctor is getting her messages.

## 2019-01-08 DIAGNOSIS — M25571 Pain in right ankle and joints of right foot: Secondary | ICD-10-CM | POA: Diagnosis not present

## 2019-01-08 DIAGNOSIS — S92132D Displaced fracture of posterior process of left talus, subsequent encounter for fracture with routine healing: Secondary | ICD-10-CM | POA: Diagnosis not present

## 2019-01-08 DIAGNOSIS — G8929 Other chronic pain: Secondary | ICD-10-CM | POA: Diagnosis not present

## 2019-01-08 DIAGNOSIS — S86011A Strain of right Achilles tendon, initial encounter: Secondary | ICD-10-CM | POA: Diagnosis not present

## 2019-01-13 DIAGNOSIS — Z8673 Personal history of transient ischemic attack (TIA), and cerebral infarction without residual deficits: Secondary | ICD-10-CM | POA: Diagnosis not present

## 2019-01-13 DIAGNOSIS — K219 Gastro-esophageal reflux disease without esophagitis: Secondary | ICD-10-CM | POA: Diagnosis not present

## 2019-01-13 DIAGNOSIS — I700 Atherosclerosis of aorta: Secondary | ICD-10-CM | POA: Diagnosis not present

## 2019-01-13 DIAGNOSIS — G629 Polyneuropathy, unspecified: Secondary | ICD-10-CM | POA: Diagnosis not present

## 2019-01-13 DIAGNOSIS — G43909 Migraine, unspecified, not intractable, without status migrainosus: Secondary | ICD-10-CM | POA: Diagnosis not present

## 2019-01-13 DIAGNOSIS — E039 Hypothyroidism, unspecified: Secondary | ICD-10-CM | POA: Diagnosis not present

## 2019-01-13 DIAGNOSIS — M419 Scoliosis, unspecified: Secondary | ICD-10-CM | POA: Diagnosis not present

## 2019-01-13 DIAGNOSIS — I081 Rheumatic disorders of both mitral and tricuspid valves: Secondary | ICD-10-CM | POA: Diagnosis not present

## 2019-01-13 DIAGNOSIS — K5790 Diverticulosis of intestine, part unspecified, without perforation or abscess without bleeding: Secondary | ICD-10-CM | POA: Diagnosis not present

## 2019-01-13 DIAGNOSIS — J449 Chronic obstructive pulmonary disease, unspecified: Secondary | ICD-10-CM | POA: Diagnosis not present

## 2019-01-13 DIAGNOSIS — K449 Diaphragmatic hernia without obstruction or gangrene: Secondary | ICD-10-CM | POA: Diagnosis not present

## 2019-01-13 DIAGNOSIS — Z9181 History of falling: Secondary | ICD-10-CM | POA: Diagnosis not present

## 2019-01-13 DIAGNOSIS — K589 Irritable bowel syndrome without diarrhea: Secondary | ICD-10-CM | POA: Diagnosis not present

## 2019-01-13 DIAGNOSIS — Z87891 Personal history of nicotine dependence: Secondary | ICD-10-CM | POA: Diagnosis not present

## 2019-01-14 DIAGNOSIS — J449 Chronic obstructive pulmonary disease, unspecified: Secondary | ICD-10-CM | POA: Diagnosis not present

## 2019-01-14 DIAGNOSIS — Z87891 Personal history of nicotine dependence: Secondary | ICD-10-CM | POA: Diagnosis not present

## 2019-01-14 DIAGNOSIS — Z8673 Personal history of transient ischemic attack (TIA), and cerebral infarction without residual deficits: Secondary | ICD-10-CM | POA: Diagnosis not present

## 2019-01-14 DIAGNOSIS — I700 Atherosclerosis of aorta: Secondary | ICD-10-CM | POA: Diagnosis not present

## 2019-01-14 DIAGNOSIS — K589 Irritable bowel syndrome without diarrhea: Secondary | ICD-10-CM | POA: Diagnosis not present

## 2019-01-14 DIAGNOSIS — G43909 Migraine, unspecified, not intractable, without status migrainosus: Secondary | ICD-10-CM | POA: Diagnosis not present

## 2019-01-14 DIAGNOSIS — Z9181 History of falling: Secondary | ICD-10-CM | POA: Diagnosis not present

## 2019-01-14 DIAGNOSIS — K5790 Diverticulosis of intestine, part unspecified, without perforation or abscess without bleeding: Secondary | ICD-10-CM | POA: Diagnosis not present

## 2019-01-14 DIAGNOSIS — E039 Hypothyroidism, unspecified: Secondary | ICD-10-CM | POA: Diagnosis not present

## 2019-01-14 DIAGNOSIS — G629 Polyneuropathy, unspecified: Secondary | ICD-10-CM | POA: Diagnosis not present

## 2019-01-14 DIAGNOSIS — K449 Diaphragmatic hernia without obstruction or gangrene: Secondary | ICD-10-CM | POA: Diagnosis not present

## 2019-01-14 DIAGNOSIS — K219 Gastro-esophageal reflux disease without esophagitis: Secondary | ICD-10-CM | POA: Diagnosis not present

## 2019-01-14 DIAGNOSIS — M419 Scoliosis, unspecified: Secondary | ICD-10-CM | POA: Diagnosis not present

## 2019-01-14 DIAGNOSIS — I081 Rheumatic disorders of both mitral and tricuspid valves: Secondary | ICD-10-CM | POA: Diagnosis not present

## 2019-01-15 ENCOUNTER — Other Ambulatory Visit: Payer: Self-pay | Admitting: Internal Medicine

## 2019-01-15 DIAGNOSIS — B353 Tinea pedis: Secondary | ICD-10-CM | POA: Diagnosis not present

## 2019-01-15 DIAGNOSIS — L249 Irritant contact dermatitis, unspecified cause: Secondary | ICD-10-CM | POA: Diagnosis not present

## 2019-01-15 DIAGNOSIS — B351 Tinea unguium: Secondary | ICD-10-CM | POA: Diagnosis not present

## 2019-01-15 DIAGNOSIS — E781 Pure hyperglyceridemia: Secondary | ICD-10-CM

## 2019-01-15 DIAGNOSIS — B356 Tinea cruris: Secondary | ICD-10-CM | POA: Diagnosis not present

## 2019-01-20 ENCOUNTER — Telehealth: Payer: Self-pay | Admitting: Cardiovascular Disease

## 2019-01-20 NOTE — Telephone Encounter (Signed)
To Dr. Gollan to review.  

## 2019-01-20 NOTE — Telephone Encounter (Signed)
Angela [name] with [facility], calling in to request therapy for 2x/week for 3 weeks for OT and also include home health aide for 2x/week for 3 weeks  Please advise appropriately

## 2019-01-21 DIAGNOSIS — M2012 Hallux valgus (acquired), left foot: Secondary | ICD-10-CM | POA: Diagnosis not present

## 2019-01-21 DIAGNOSIS — M79671 Pain in right foot: Secondary | ICD-10-CM | POA: Diagnosis not present

## 2019-01-21 DIAGNOSIS — M2011 Hallux valgus (acquired), right foot: Secondary | ICD-10-CM | POA: Diagnosis not present

## 2019-01-21 DIAGNOSIS — M79672 Pain in left foot: Secondary | ICD-10-CM | POA: Diagnosis not present

## 2019-01-21 DIAGNOSIS — M7661 Achilles tendinitis, right leg: Secondary | ICD-10-CM | POA: Diagnosis not present

## 2019-01-21 NOTE — Telephone Encounter (Signed)
Okay to order home health aide and OT

## 2019-01-21 NOTE — Telephone Encounter (Signed)
Provided orders to [place] at [facility] approved by Dr. Rockey Situ. She verbalized understanding with no further questions at this time.

## 2019-01-23 ENCOUNTER — Telehealth: Payer: Self-pay | Admitting: Internal Medicine

## 2019-01-23 NOTE — Telephone Encounter (Signed)
Angela [name] with [facility] at [facility] called and stated that pt refused care and blocked her telephone number.

## 2019-01-24 DIAGNOSIS — I700 Atherosclerosis of aorta: Secondary | ICD-10-CM | POA: Diagnosis not present

## 2019-01-24 DIAGNOSIS — K449 Diaphragmatic hernia without obstruction or gangrene: Secondary | ICD-10-CM | POA: Diagnosis not present

## 2019-01-24 DIAGNOSIS — J449 Chronic obstructive pulmonary disease, unspecified: Secondary | ICD-10-CM | POA: Diagnosis not present

## 2019-01-24 DIAGNOSIS — K219 Gastro-esophageal reflux disease without esophagitis: Secondary | ICD-10-CM | POA: Diagnosis not present

## 2019-01-24 DIAGNOSIS — I081 Rheumatic disorders of both mitral and tricuspid valves: Secondary | ICD-10-CM | POA: Diagnosis not present

## 2019-01-24 DIAGNOSIS — Z9181 History of falling: Secondary | ICD-10-CM | POA: Diagnosis not present

## 2019-01-24 DIAGNOSIS — E039 Hypothyroidism, unspecified: Secondary | ICD-10-CM | POA: Diagnosis not present

## 2019-01-24 DIAGNOSIS — M419 Scoliosis, unspecified: Secondary | ICD-10-CM | POA: Diagnosis not present

## 2019-01-24 DIAGNOSIS — K589 Irritable bowel syndrome without diarrhea: Secondary | ICD-10-CM | POA: Diagnosis not present

## 2019-01-24 DIAGNOSIS — G43909 Migraine, unspecified, not intractable, without status migrainosus: Secondary | ICD-10-CM | POA: Diagnosis not present

## 2019-01-24 DIAGNOSIS — Z87891 Personal history of nicotine dependence: Secondary | ICD-10-CM | POA: Diagnosis not present

## 2019-01-24 DIAGNOSIS — K5790 Diverticulosis of intestine, part unspecified, without perforation or abscess without bleeding: Secondary | ICD-10-CM | POA: Diagnosis not present

## 2019-01-24 DIAGNOSIS — G629 Polyneuropathy, unspecified: Secondary | ICD-10-CM | POA: Diagnosis not present

## 2019-01-24 DIAGNOSIS — Z8673 Personal history of transient ischemic attack (TIA), and cerebral infarction without residual deficits: Secondary | ICD-10-CM | POA: Diagnosis not present

## 2019-01-28 DIAGNOSIS — M419 Scoliosis, unspecified: Secondary | ICD-10-CM | POA: Diagnosis not present

## 2019-01-28 DIAGNOSIS — Z9181 History of falling: Secondary | ICD-10-CM | POA: Diagnosis not present

## 2019-01-28 DIAGNOSIS — I081 Rheumatic disorders of both mitral and tricuspid valves: Secondary | ICD-10-CM | POA: Diagnosis not present

## 2019-01-28 DIAGNOSIS — K449 Diaphragmatic hernia without obstruction or gangrene: Secondary | ICD-10-CM | POA: Diagnosis not present

## 2019-01-28 DIAGNOSIS — G43909 Migraine, unspecified, not intractable, without status migrainosus: Secondary | ICD-10-CM | POA: Diagnosis not present

## 2019-01-28 DIAGNOSIS — Z87891 Personal history of nicotine dependence: Secondary | ICD-10-CM | POA: Diagnosis not present

## 2019-01-28 DIAGNOSIS — J449 Chronic obstructive pulmonary disease, unspecified: Secondary | ICD-10-CM | POA: Diagnosis not present

## 2019-01-28 DIAGNOSIS — K5790 Diverticulosis of intestine, part unspecified, without perforation or abscess without bleeding: Secondary | ICD-10-CM | POA: Diagnosis not present

## 2019-01-28 DIAGNOSIS — G629 Polyneuropathy, unspecified: Secondary | ICD-10-CM | POA: Diagnosis not present

## 2019-01-28 DIAGNOSIS — K219 Gastro-esophageal reflux disease without esophagitis: Secondary | ICD-10-CM | POA: Diagnosis not present

## 2019-01-28 DIAGNOSIS — E039 Hypothyroidism, unspecified: Secondary | ICD-10-CM | POA: Diagnosis not present

## 2019-01-28 DIAGNOSIS — Z8673 Personal history of transient ischemic attack (TIA), and cerebral infarction without residual deficits: Secondary | ICD-10-CM | POA: Diagnosis not present

## 2019-01-28 DIAGNOSIS — K589 Irritable bowel syndrome without diarrhea: Secondary | ICD-10-CM | POA: Diagnosis not present

## 2019-01-28 DIAGNOSIS — I700 Atherosclerosis of aorta: Secondary | ICD-10-CM | POA: Diagnosis not present

## 2019-01-29 ENCOUNTER — Ambulatory Visit (INDEPENDENT_AMBULATORY_CARE_PROVIDER_SITE_OTHER): Payer: Medicare Other | Admitting: Obstetrics and Gynecology

## 2019-01-29 ENCOUNTER — Encounter: Payer: Self-pay | Admitting: Obstetrics and Gynecology

## 2019-01-29 ENCOUNTER — Other Ambulatory Visit: Payer: Self-pay

## 2019-01-29 ENCOUNTER — Telehealth: Payer: Self-pay

## 2019-01-29 VITALS — BP 168/94 | HR 64 | Ht 67.0 in | Wt 143.7 lb

## 2019-01-29 DIAGNOSIS — E559 Vitamin D deficiency, unspecified: Secondary | ICD-10-CM

## 2019-01-29 DIAGNOSIS — I081 Rheumatic disorders of both mitral and tricuspid valves: Secondary | ICD-10-CM | POA: Diagnosis not present

## 2019-01-29 DIAGNOSIS — Z01419 Encounter for gynecological examination (general) (routine) without abnormal findings: Secondary | ICD-10-CM | POA: Diagnosis not present

## 2019-01-29 DIAGNOSIS — Z1231 Encounter for screening mammogram for malignant neoplasm of breast: Secondary | ICD-10-CM | POA: Diagnosis not present

## 2019-01-29 DIAGNOSIS — K219 Gastro-esophageal reflux disease without esophagitis: Secondary | ICD-10-CM | POA: Diagnosis not present

## 2019-01-29 DIAGNOSIS — E785 Hyperlipidemia, unspecified: Secondary | ICD-10-CM | POA: Diagnosis not present

## 2019-01-29 DIAGNOSIS — K5790 Diverticulosis of intestine, part unspecified, without perforation or abscess without bleeding: Secondary | ICD-10-CM | POA: Diagnosis not present

## 2019-01-29 DIAGNOSIS — G629 Polyneuropathy, unspecified: Secondary | ICD-10-CM | POA: Diagnosis not present

## 2019-01-29 DIAGNOSIS — Z8673 Personal history of transient ischemic attack (TIA), and cerebral infarction without residual deficits: Secondary | ICD-10-CM | POA: Diagnosis not present

## 2019-01-29 DIAGNOSIS — E538 Deficiency of other specified B group vitamins: Secondary | ICD-10-CM | POA: Diagnosis not present

## 2019-01-29 DIAGNOSIS — E039 Hypothyroidism, unspecified: Secondary | ICD-10-CM

## 2019-01-29 DIAGNOSIS — Z87891 Personal history of nicotine dependence: Secondary | ICD-10-CM | POA: Diagnosis not present

## 2019-01-29 DIAGNOSIS — R7303 Prediabetes: Secondary | ICD-10-CM

## 2019-01-29 DIAGNOSIS — G43909 Migraine, unspecified, not intractable, without status migrainosus: Secondary | ICD-10-CM | POA: Diagnosis not present

## 2019-01-29 DIAGNOSIS — I700 Atherosclerosis of aorta: Secondary | ICD-10-CM | POA: Diagnosis not present

## 2019-01-29 DIAGNOSIS — M419 Scoliosis, unspecified: Secondary | ICD-10-CM | POA: Diagnosis not present

## 2019-01-29 DIAGNOSIS — K589 Irritable bowel syndrome without diarrhea: Secondary | ICD-10-CM | POA: Diagnosis not present

## 2019-01-29 DIAGNOSIS — Z9181 History of falling: Secondary | ICD-10-CM | POA: Diagnosis not present

## 2019-01-29 DIAGNOSIS — K449 Diaphragmatic hernia without obstruction or gangrene: Secondary | ICD-10-CM | POA: Diagnosis not present

## 2019-01-29 DIAGNOSIS — J449 Chronic obstructive pulmonary disease, unspecified: Secondary | ICD-10-CM | POA: Diagnosis not present

## 2019-01-29 DIAGNOSIS — N952 Postmenopausal atrophic vaginitis: Secondary | ICD-10-CM

## 2019-01-29 MED ORDER — PREMARIN 0.625 MG/GM VA CREA: 1.0000 | TOPICAL_CREAM | VAGINAL | 2 refills | Status: DC

## 2019-01-29 NOTE — Progress Notes (Signed)
ANNUAL PREVENTATIVE CARE GYNECOLOGY  ENCOUNTER NOTE  Subjective:       Angela [name] is a 69 y.o. G73P0010 female here for a routine annual gynecologic exam. The patient is not sexually active. The patient is using local hormone replacement therapy. Patient denies post-menopausal vaginal bleeding. The patient wears seatbelts: yes. The patient participates in regular exercise: no.    Current complaints: 1.  Patient reports that she desires to have her Estring removed.  She thinks that it is causing her blood pressures to be unusually elevated.  Notes that she has a h/o HTN that is usually controlled, but since starting the ring she has noted more elevated pressures.   2. Patient also notes that she was hospitalized in December for altered mental status, and there was concern for her being combative if psychosis was involved and had to be involuntarily committed for several days.   Gynecologic History No LMP recorded. Patient is postmenopausal. Contraception: post menopausal status Last Pap: no longer needed.  Last pap was normal.  Last mammogram: 07/05/2018. Results were: normal Last Colonoscopy: No evidence of colonoscopy in Epic, did have upper endoscopy in 03/2015. Patient reports that she has had a colonoscopy within the past 5 years.  Last Dexa Scan: 07/05/2018.    Obstetric History OB History  Gravida Para Term Preterm AB Living  1       1 0  SAB TAB Ectopic Multiple Live Births    1          # Outcome Date GA Lbr Len/2nd Weight Sex Delivery Anes PTL Lv  1 TAB             Past Medical History:  Diagnosis Date  . Allergic rhinitis   . Anemia   . Blurred vision   . Depression   . Diverticulosis   . Endometriosis   . Falls   . GERD (gastroesophageal reflux disease)   . Hernia, hiatal   . Hypothyroidism   . IBS (irritable bowel syndrome)   . Malignant neoplasm of skin   . Migraine   . Neuropathy   . PTSD (post-traumatic stress disorder)   . Thyroid disease      Family History  Problem Relation Age of Onset  . Heart disease Father   . Hearing loss Father   . COPD Father   . Depression Father   . Stroke Father   . Vision loss Father   . Varicose Veins Father   . Cancer Mother        lung  . Depression Sister   . Arthritis Sister   . Arthritis Brother   . Hernia Brother   . Anxiety disorder Brother   . Arthritis Brother   . Hernia Brother   . Anxiety disorder Brother   . Urolithiasis Neg Hx   . Kidney disease Neg Hx   . Kidney cancer Neg Hx   . Prostate cancer Neg Hx     Past Surgical History:  Procedure Laterality Date  . APPENDECTOMY    . CHOLECYSTECTOMY    . CHOLECYSTECTOMY, LAPAROSCOPIC    . ESOPHAGOGASTRODUODENOSCOPY (EGD) WITH PROPOFOL N/A 03/29/2015   Procedure: ESOPHAGOGASTRODUODENOSCOPY (EGD) WITH PROPOFOL;  Surgeon: Josefine Class, MD;  Location: [facility] ENDOSCOPY;  Service: Endoscopy;  Laterality: N/A;  . HERNIA REPAIR    . laproscopy    . TONSILLECTOMY    . uteral suspension      Social History   Socioeconomic History  . Marital status: Single  Spouse name: Not on file  . Number of children: 0  . Years of education: 1.5 years of college  . Highest education level: Some college, no degree  Occupational History  . Occupation: Retired  Tobacco Use  . Smoking status: Former Smoker    Quit date: 11/17/1976    Years since quitting: 42.2  . Smokeless tobacco: Never Used  Substance and Sexual Activity  . Alcohol use: Yes    Comment: occ  . Drug use: No  . Sexual activity: Not Currently  Other Topics Concern  . Not on file  Social History Narrative   Lives at home alone.   Right-handed.   No daily caffeine use.   Social Determinants of Health   Financial Resource Strain:   . Difficulty of Paying Living Expenses: Not on file  Food Insecurity:   . Worried About Charity fundraiser in the Last Year: Not on file  . Ran Out of Food in the Last Year: Not on file  Transportation Needs:   . Lack of  Transportation (Medical): Not on file  . Lack of Transportation (Non-Medical): Not on file  Physical Activity:   . Days of Exercise per Week: Not on file  . Minutes of Exercise per Session: Not on file  Stress:   . Feeling of Stress : Not on file  Social Connections:   . Frequency of Communication with Friends and Family: Not on file  . Frequency of Social Gatherings with Friends and Family: Not on file  . Attends Religious Services: Not on file  . Active Member of Clubs or Organizations: Not on file  . Attends Archivist Meetings: Not on file  . Marital Status: Not on file  Intimate Partner Violence:   . Fear of Current or Ex-Partner: Not on file  . Emotionally Abused: Not on file  . Physically Abused: Not on file  . Sexually Abused: Not on file    Current Outpatient Medications on File Prior to Visit  Medication Sig Dispense Refill  . acetaminophen (TYLENOL) 650 MG CR tablet Take 650 mg by mouth every 8 (eight) hours as needed for pain.    . Acetylcysteine (NAC) 500 MG CAPS Take 500 mg by mouth 2 (two) times a day.    . albuterol (VENTOLIN HFA) 108 (90 Base) MCG/ACT inhaler Inhale 2 puffs into the lungs every 4 (four) hours as needed for wheezing or shortness of breath. 18 g 3  . Amino Acids-Protein Hydrolys (FEEDING SUPPLEMENT, PRO-STAT 64,) LIQD Take 30 mLs by mouth daily. 887 mL 0  . atorvastatin (LIPITOR) 20 MG tablet Take 1 tablet (20 mg total) by mouth at bedtime. 90 tablet 0  . Biotin 10000 MCG TABS Take 10,000 mcg by mouth daily.     . Calcium Citrate (CITRACAL PO) Take 800 mg by mouth. Takes 2 (400mg ) tablets BID    . Cholecalciferol 25 MCG (1000 UT) tablet Take 1 tablet (1,000 Units total) by mouth daily. 30 tablet 1  . Cyanocobalamin (VITAMIN B-12) 1000 MCG SUBL Place 1,000 mcg under the tongue.     . diclofenac sodium (VOLTAREN) 1 % GEL Apply small amount (2 grams) topically over knees or hands up to four times a day if needed for arthritis 100 g 0  .  estradiol (ESTRING) 2 MG vaginal ring Place 2 mg vaginally every 3 (three) months. follow package directions 1 each 4  . famotidine (PEPCID) 20 MG tablet Take 1 tablet (20 mg total) by mouth 2 (  two) times daily as needed for heartburn or indigestion. 180 tablet 0  . ferrous sulfate 325 (65 FE) MG tablet Take 1 tablet (325 mg total) by mouth daily with breakfast. 90 tablet 0  . fluticasone (FLONASE) 50 MCG/ACT nasal spray Place 2 sprays into both nostrils daily.     Marland Kitchen gabapentin (NEURONTIN) 100 MG capsule Take 100 mg by mouth 2 (two) times daily.    . hydrOXYzine (ATARAX/VISTARIL) 10 MG tablet TAKE 1 TO 2 TABLETS BY MOUTH ONCE DAILY AS NEEDED    . Icosapent Ethyl (VASCEPA) 1 g CAPS TAKE 2 CAPSULES BY MOUTH TWICE DAILY (TO HELP WITH TRIGLYCERIDES) 180 capsule 0  . Lactulose 20 GM/30ML SOLN 30 ml every 4 hours until constipation is relieved 236 mL 3  . levalbuterol (XOPENEX) 0.63 MG/3ML nebulizer solution Take 3 mLs (0.63 mg total) by nebulization every 4 (four) hours as needed for wheezing or shortness of breath. 300 mL 2  . levothyroxine (SYNTHROID) 50 MCG tablet Take 1 tablet (50 mcg total) by mouth daily at 6 (six) AM. (Patient taking differently: Take 100 mcg by mouth daily at 6 (six) AM. ) 30 tablet 0  . linaclotide (LINZESS) 290 MCG CAPS capsule Take 1 capsule (290 mcg total) by mouth daily before breakfast. 30 capsule 0  . LORazepam (ATIVAN) 0.5 MG tablet TAKE 1 TABLET BY MOUTH AS NEEDED ONCE DAILY    . losartan (COZAAR) 50 MG tablet Take 1 tablet (50 mg total) by mouth daily. 90 tablet 3  . Magnesium 500 MG TABS Take 500 mg by mouth every other day.    . Multiple Vitamin (MULTIVITAMIN WITH MINERALS) TABS tablet Take 1 tablet by mouth daily. 30 tablet 1  . PARoxetine (PAXIL) 20 MG tablet Take 20 mg by mouth daily.    . prednisoLONE acetate (PRED FORTE) 1 % ophthalmic suspension INSTILL 1 DROP INTO EACH EYE TWICE DAILY    . pregabalin (LYRICA) 75 MG capsule Take 75 mg by mouth 2 (two) times  daily.     . promethazine (PHENERGAN) 12.5 MG tablet Take 12.5 mg by mouth every 6 (six) hours as needed for nausea or vomiting.    . pyridoxine (B-6) 100 MG tablet Take 1 tablet (100 mg total) by mouth 2 (two) times daily. 60 tablet 0  . tiZANidine (ZANAFLEX) 2 MG tablet Take by mouth every 6 (six) hours as needed for muscle spasms.    . traZODone (DESYREL) 50 MG tablet Take 1 tablet (50 mg total) by mouth at bedtime as needed. Pt takes 1/2 tablet qhs 90 tablet 0  . triamcinolone ointment (KENALOG) 0.5 % Apply 1 application topically 2 (two) times daily. 30 g 2  . umeclidinium bromide (INCRUSE ELLIPTA) 62.5 MCG/INH AEPB Inhale 1 puff into the lungs daily. 60 each 5  . XIIDRA 5 % SOLN INSTILL 1 DROP INTO EACH EYE TWICE DAILY    . ondansetron (ZOFRAN ODT) 4 MG disintegrating tablet Allow 1-2 tablets to dissolve in your mouth every 8 hours as needed for nausea/vomiting (Patient not taking: Reported on 01/29/2019) 30 tablet 0  . PREMARIN vaginal cream Place 1 Applicatorful vaginally 2 (two) times a week. (Patient not taking: Reported on 01/29/2019) 60 g 0   No current facility-administered medications on file prior to visit.    Allergies  Allergen Reactions  . Advair Diskus [Fluticasone-Salmeterol] Shortness Of Breath  . Bee Venom Hives  . Darvon [Propoxyphene] Anaphylaxis and Hives    Hives/throat swelling   . Dicyclomine Itching  .  Other Hives    Spider bites cause hives  . Oxycodone-Acetaminophen Nausea And Vomiting and Other (See Comments)    hallucinations   . Peanuts [Peanut Oil] Anaphylaxis and Hives  . Penicillins Hives    Has patient had a PCN reaction causing immediate rash, facial/tongue/throat swelling, SOB or lightheadedness with hypotension: Unknown Has patient had a PCN reaction causing severe rash involving mucus membranes or skin necrosis: Unknown Has patient had a PCN reaction that required hospitalization: Unknown Has patient had a PCN reaction occurring within the last  10 years: Unknown If all of the above answers are "NO", then may proceed with Cephalosporin use.  . Sulfa Antibiotics Anaphylaxis, Hives and Itching  . Sulfacetamide Sodium Anaphylaxis, Hives and Itching  . Ultram [Tramadol] Hives  . Amikacin Nausea And Vomiting  . Aspirin Hives  . Haloperidol Nausea And Vomiting  . Hymenoptera Venom Preparations Hives    Reaction to spider bites  . Imipramine Hives    Reaction to Tofranil  . Keflex [Cephalexin] Hives  . Lactose   . Lactose Intolerance (Gi)   . Risperdal [Risperidone]     Serotonin syndrome  . Sulfur Hives  . Adhesive [Tape] Hives and Rash  . Albuterol Palpitations    Tolerates levalbuterol  . Ciprofloxacin Other (See Comments)    tendonopathy  . Hydroxyzine Anxiety and Itching  . Peanut Butter Flavor Anxiety  . Prednisone Nausea And Vomiting, Anxiety and Other (See Comments)    Crazy mood swings, strange thoughts. Tolerates depomedrol       Review of Systems ROS Review of Systems - General ROS: negative for - chills, fatigue, fever, hot flashes, night sweats, weight gain or weight loss Psychological ROS: negative for - anxiety, decreased libido, depression, mood swings, physical abuse or sexual abuse Ophthalmic ROS: negative for - blurry vision, eye pain or loss of vision ENT ROS: negative for - headaches, hearing change, visual changes or vocal changes Allergy and Immunology ROS: negative for - hives, itchy/watery eyes or seasonal allergies Hematological and Lymphatic ROS: negative for - bleeding problems, bruising, swollen lymph nodes or weight loss Endocrine ROS: negative for - galactorrhea, hair pattern changes, hot flashes, malaise/lethargy, mood swings, palpitations, polydipsia/polyuria, skin changes, temperature intolerance or unexpected weight changes Breast ROS: negative for - new or changing breast lumps or nipple discharge Respiratory ROS: negative for - cough or shortness of breath Cardiovascular ROS: negative  for - chest pain, irregular heartbeat, palpitations or shortness of breath Gastrointestinal ROS: no abdominal pain, change in bowel habits, or black or bloody stools. Positive for abdominal distention. Has been dealing with some constipation.  Genito-Urinary ROS: no dysuria, trouble voiding, or hematuria.  Musculoskeletal ROS: negative for - joint pain or joint stiffness Neurological ROS: negative for - bowel and bladder control changes Dermatological ROS: negative for rash and skin lesion changes   Objective:   BP (!) 168/94   Pulse 64   Ht 5\' 7"  (1.702 m)   Wt 143 lb 11.2 oz (65.2 kg)   BMI 22.51 kg/m  CONSTITUTIONAL: Well-developed, well-nourished female in no acute distress.  PSYCHIATRIC: Normal mood and affect. Normal behavior. Normal judgment and thought content. [place]: Alert and oriented to person, place, and time. Normal muscle tone coordination. No cranial nerve deficit noted. HENT:  Normocephalic, atraumatic, External right and left ear normal. Oropharynx is clear and moist.  EYES: Conjunctivae and EOM are normal. Pupils are equal, round, and reactive to light. No scleral icterus.  NECK: Normal range of motion, supple, no masses.  Normal thyroid.  SKIN: Skin is warm and dry. No rash noted. Not diaphoretic. No erythema. No pallor. CARDIOVASCULAR: Normal heart rate noted, regular rhythm, no murmur. RESPIRATORY: Clear to auscultation bilaterally. Effort and breath sounds normal, no problems with respiration noted. BREASTS: Symmetric in size. No masses, skin changes, nipple drainage, or lymphadenopathy. ABDOMEN: Soft, normal bowel sounds, mild to moderate distention noted.  No tenderness, rebound or guarding.  BLADDER: Normal PELVIC:  Bladder no bladder distension noted  Urethra: normal appearing urethra with no masses, tenderness or lesions  Vulva: normal appearing vulva with no masses, tenderness or lesions  Vagina: normal appearing vagina with mild atrophy, no discharge,  no lesions.   Cervix: normal appearing cervix without discharge or lesions.   Uterus: uterus is normal size, shape, consistency and nontender  Adnexa: normal adnexa in size, nontender and no masses  RV: External Exam NormaI, No Rectal Masses and Normal Sphincter tone  MUSCULOSKELETAL: Normal range of motion. No tenderness.  No cyanosis, clubbing, or edema.  2+ distal pulses. LYMPHATIC: No Axillary, Supraclavicular, or Inguinal Adenopathy.   Labs: Lab Results  Component Value Date   WBC 6.4 01/01/2019   HGB 15.3 (H) 01/01/2019   HCT 45.8 01/01/2019   MCV 91.8 01/01/2019   PLT 298 01/01/2019    Lab Results  Component Value Date   CREATININE 0.80 01/01/2019   BUN 14 01/01/2019   NA 136 01/01/2019   K 4.3 01/01/2019   CL 99 01/01/2019   CO2 27 01/01/2019    Lab Results  Component Value Date   ALT 33 01/01/2019   AST 38 01/01/2019   ALKPHOS 118 01/01/2019   BILITOT 1.0 01/01/2019    Lab Results  Component Value Date   CHOL 199 12/26/2018   HDL 62 12/26/2018   LDLCALC 75 12/26/2018   TRIG 312 (H) 12/26/2018   CHOLHDL 3.2 12/26/2018    Lab Results  Component Value Date   TSH 7.098 (H) 01/01/2019    Lab Results  Component Value Date   HGBA1C 6.0 (H) 12/26/2018     Assessment:   1. Encounter for well woman exam with routine gynecological exam   2. Breast cancer screening by mammogram   3. Dyslipidemia   4. Vitamin D deficiency   5. Vitamin B 12 deficiency   6. Vaginal atrophy   7. Prediabetes   8. Acquired hypothyroidism    Plan:  Pap: Not needed. Patient beyond recommended age of screening.  Mammogram:  Up to date. Ordered for next year.  Stool Guaiac Testing:  Patient reports that she has had a colonoscopy within the past 5 years, but none noted in Epic.  Labs: Lipid panel.  Patient also desires for her Vitamin levels to be checked due to history of deficiency (B12 and D).  Routine preventative health maintenance measures emphasized:  Exercise/Diet/Weight control, Tobacco Warnings, Alcohol/Substance use risks, Stress Management, Peer Pressure Issues and Safe Sex Estring removed today at patient's request. Advised to continue to monitor BPs. Patient desires to resume Premarin cream for management of vaginal atrophy.  Given samples of Premarin in the office, and prescription given.  Declines flu vaccine.  Prediabetes and dyslipidemia, advised on healthy diet and lifestyle. Thyroid level to be rechecked in April by PCP, had medications recently adjusted.  Return to [place], MD  [facility]

## 2019-01-29 NOTE — Patient Instructions (Signed)
Health Maintenance for Postmenopausal Women Menopause is a normal process in which your ability to get pregnant comes to an end. This process happens slowly over many months or years, usually between the ages of 48 and 55. Menopause is complete when you have missed your menstrual periods for 12 months. It is important to talk with your health care provider about some of the most common conditions that affect women after menopause (postmenopausal women). These include heart disease, cancer, and bone loss (osteoporosis). Adopting a healthy lifestyle and getting preventive care can help to promote your health and wellness. The actions you take can also lower your chances of developing some of these common conditions. What should I know about menopause? During menopause, you may get a number of symptoms, such as:  Hot flashes. These can be moderate or severe.  Night sweats.  Decrease in sex drive.  Mood swings.  Headaches.  Tiredness.  Irritability.  Memory problems.  Insomnia. Choosing to treat or not to treat these symptoms is a decision that you make with your health care provider. Do I need hormone replacement therapy?  Hormone replacement therapy is effective in treating symptoms that are caused by menopause, such as hot flashes and night sweats.  Hormone replacement carries certain risks, especially as you become older. If you are thinking about using estrogen or estrogen with progestin, discuss the benefits and risks with your health care provider. What is my risk for heart disease and stroke? The risk of heart disease, heart attack, and stroke increases as you age. One of the causes may be a change in the body's hormones during menopause. This can affect how your body uses dietary fats, triglycerides, and cholesterol. Heart attack and stroke are medical emergencies. There are many things that you can do to help prevent heart disease and stroke. Watch your blood pressure  High  blood pressure causes heart disease and increases the risk of stroke. This is more likely to develop in people who have high blood pressure readings, are of African descent, or are overweight.  Have your blood pressure checked: ? Every 3-5 years if you are 18-39 years of age. ? Every year if you are 40 years old or older. Eat a healthy diet   Eat a diet that includes plenty of vegetables, fruits, low-fat dairy products, and lean protein.  Do not eat a lot of foods that are high in solid fats, added sugars, or sodium. Get regular exercise Get regular exercise. This is one of the most important things you can do for your health. Most adults should:  Try to exercise for at least 150 minutes each week. The exercise should increase your heart rate and make you sweat (moderate-intensity exercise).  Try to do strengthening exercises at least twice each week. Do these in addition to the moderate-intensity exercise.  Spend less time sitting. Even light physical activity can be beneficial. Other tips  Work with your health care provider to achieve or maintain a healthy weight.  Do not use any products that contain nicotine or tobacco, such as cigarettes, e-cigarettes, and chewing tobacco. If you need help quitting, ask your health care provider.  Know your numbers. Ask your health care provider to check your cholesterol and your blood sugar (glucose). Continue to have your blood tested as directed by your health care provider. Do I need screening for cancer? Depending on your health history and family history, you may need to have cancer screening at different stages of your life. This   may include screening for:  Breast cancer.  Cervical cancer.  Lung cancer.  Colorectal cancer. What is my risk for osteoporosis? After menopause, you may be at increased risk for osteoporosis. Osteoporosis is a condition in which bone destruction happens more quickly than new bone creation. To help prevent  osteoporosis or the bone fractures that can happen because of osteoporosis, you may take the following actions:  If you are 19-50 years old, get at least 1,000 mg of calcium and at least 600 mg of vitamin D per day.  If you are older than age 50 but younger than age 70, get at least 1,200 mg of calcium and at least 600 mg of vitamin D per day.  If you are older than age 70, get at least 1,200 mg of calcium and at least 800 mg of vitamin D per day. Smoking and drinking excessive alcohol increase the risk of osteoporosis. Eat foods that are rich in calcium and vitamin D, and do weight-bearing exercises several times each week as directed by your health care provider. How does menopause affect my mental health? Depression may occur at any age, but it is more common as you become older. Common symptoms of depression include:  Low or sad mood.  Changes in sleep patterns.  Changes in appetite or eating patterns.  Feeling an overall lack of motivation or enjoyment of activities that you previously enjoyed.  Frequent crying spells. Talk with your health care provider if you think that you are experiencing depression. General instructions See your health care provider for regular wellness exams and vaccines. This may include:  Scheduling regular health, dental, and eye exams.  Getting and maintaining your vaccines. These include: ? Influenza vaccine. Get this vaccine each year before the flu season begins. ? Pneumonia vaccine. ? Shingles vaccine. ? Tetanus, diphtheria, and pertussis (Tdap) booster vaccine. Your health care provider may also recommend other immunizations. Tell your health care provider if you have ever been abused or do not feel safe at home. Summary  Menopause is a normal process in which your ability to get pregnant comes to an end.  This condition causes hot flashes, night sweats, decreased interest in sex, mood swings, headaches, or lack of sleep.  Treatment for this  condition may include hormone replacement therapy.  Take actions to keep yourself healthy, including exercising regularly, eating a healthy diet, watching your weight, and checking your blood pressure and blood sugar levels.  Get screened for cancer and depression. Make sure that you are up to date with all your vaccines. This information is not intended to replace advice given to you by your health care provider. Make sure you discuss any questions you have with your health care provider. Document Revised: 12/26/2017 Document Reviewed: 12/26/2017 Elsevier Patient Education  2020 [employer]  

## 2019-01-29 NOTE — Progress Notes (Signed)
Pt is present for annual exam. Pt's last mammogram 07/05/18 mammogram order placed. Pt declined the flu vaccine. Pt stated that she has rash that burns.

## 2019-01-29 NOTE — Telephone Encounter (Signed)
Pt called to say the medicaid transportation Lucianne Lei is not able to pic her up due to Covid restrictions. Has a ride on the way to pick her. Appt is at 2pm. Patient asked me to check with nurse to see if it;s ok to be seen. I was unable to reach nurse. Patient says she;s coming anyway an has to be seen

## 2019-01-30 NOTE — Telephone Encounter (Signed)
Pt made it to her appointment without any problems.

## 2019-01-31 ENCOUNTER — Encounter: Payer: Self-pay | Admitting: Obstetrics and Gynecology

## 2019-02-04 DIAGNOSIS — L853 Xerosis cutis: Secondary | ICD-10-CM | POA: Diagnosis not present

## 2019-02-04 DIAGNOSIS — B353 Tinea pedis: Secondary | ICD-10-CM | POA: Diagnosis not present

## 2019-02-04 DIAGNOSIS — D692 Other nonthrombocytopenic purpura: Secondary | ICD-10-CM | POA: Diagnosis not present

## 2019-02-04 DIAGNOSIS — L299 Pruritus, unspecified: Secondary | ICD-10-CM | POA: Diagnosis not present

## 2019-02-04 DIAGNOSIS — B372 Candidiasis of skin and nail: Secondary | ICD-10-CM | POA: Diagnosis not present

## 2019-02-06 DIAGNOSIS — J449 Chronic obstructive pulmonary disease, unspecified: Secondary | ICD-10-CM | POA: Diagnosis not present

## 2019-02-06 DIAGNOSIS — K449 Diaphragmatic hernia without obstruction or gangrene: Secondary | ICD-10-CM | POA: Diagnosis not present

## 2019-02-06 DIAGNOSIS — I700 Atherosclerosis of aorta: Secondary | ICD-10-CM | POA: Diagnosis not present

## 2019-02-06 DIAGNOSIS — K5790 Diverticulosis of intestine, part unspecified, without perforation or abscess without bleeding: Secondary | ICD-10-CM | POA: Diagnosis not present

## 2019-02-06 DIAGNOSIS — Z87891 Personal history of nicotine dependence: Secondary | ICD-10-CM | POA: Diagnosis not present

## 2019-02-06 DIAGNOSIS — K219 Gastro-esophageal reflux disease without esophagitis: Secondary | ICD-10-CM | POA: Diagnosis not present

## 2019-02-06 DIAGNOSIS — M419 Scoliosis, unspecified: Secondary | ICD-10-CM | POA: Diagnosis not present

## 2019-02-06 DIAGNOSIS — K589 Irritable bowel syndrome without diarrhea: Secondary | ICD-10-CM | POA: Diagnosis not present

## 2019-02-06 DIAGNOSIS — G43909 Migraine, unspecified, not intractable, without status migrainosus: Secondary | ICD-10-CM | POA: Diagnosis not present

## 2019-02-06 DIAGNOSIS — Z8673 Personal history of transient ischemic attack (TIA), and cerebral infarction without residual deficits: Secondary | ICD-10-CM | POA: Diagnosis not present

## 2019-02-06 DIAGNOSIS — I081 Rheumatic disorders of both mitral and tricuspid valves: Secondary | ICD-10-CM | POA: Diagnosis not present

## 2019-02-06 DIAGNOSIS — Z9181 History of falling: Secondary | ICD-10-CM | POA: Diagnosis not present

## 2019-02-06 DIAGNOSIS — E039 Hypothyroidism, unspecified: Secondary | ICD-10-CM | POA: Diagnosis not present

## 2019-02-06 DIAGNOSIS — G629 Polyneuropathy, unspecified: Secondary | ICD-10-CM | POA: Diagnosis not present

## 2019-02-07 DIAGNOSIS — H5213 Myopia, bilateral: Secondary | ICD-10-CM | POA: Diagnosis not present

## 2019-02-21 DIAGNOSIS — Z87891 Personal history of nicotine dependence: Secondary | ICD-10-CM | POA: Diagnosis not present

## 2019-02-21 DIAGNOSIS — Z882 Allergy status to sulfonamides status: Secondary | ICD-10-CM | POA: Diagnosis not present

## 2019-02-21 DIAGNOSIS — R0789 Other chest pain: Secondary | ICD-10-CM | POA: Diagnosis not present

## 2019-02-21 DIAGNOSIS — Z880 Allergy status to penicillin: Secondary | ICD-10-CM | POA: Diagnosis not present

## 2019-02-21 DIAGNOSIS — R0600 Dyspnea, unspecified: Secondary | ICD-10-CM | POA: Diagnosis not present

## 2019-02-21 DIAGNOSIS — Z885 Allergy status to narcotic agent status: Secondary | ICD-10-CM | POA: Diagnosis not present

## 2019-02-21 DIAGNOSIS — Z8673 Personal history of transient ischemic attack (TIA), and cerebral infarction without residual deficits: Secondary | ICD-10-CM | POA: Diagnosis not present

## 2019-02-21 DIAGNOSIS — Z886 Allergy status to analgesic agent status: Secondary | ICD-10-CM | POA: Diagnosis not present

## 2019-02-21 DIAGNOSIS — R110 Nausea: Secondary | ICD-10-CM | POA: Diagnosis not present

## 2019-02-21 DIAGNOSIS — Z79891 Long term (current) use of opiate analgesic: Secondary | ICD-10-CM | POA: Diagnosis not present

## 2019-02-21 DIAGNOSIS — Z792 Long term (current) use of antibiotics: Secondary | ICD-10-CM | POA: Diagnosis not present

## 2019-02-21 DIAGNOSIS — R1032 Left lower quadrant pain: Secondary | ICD-10-CM | POA: Diagnosis not present

## 2019-02-21 DIAGNOSIS — Z91048 Other nonmedicinal substance allergy status: Secondary | ICD-10-CM | POA: Diagnosis not present

## 2019-02-21 DIAGNOSIS — R1013 Epigastric pain: Secondary | ICD-10-CM | POA: Diagnosis not present

## 2019-02-21 DIAGNOSIS — E039 Hypothyroidism, unspecified: Secondary | ICD-10-CM | POA: Diagnosis not present

## 2019-02-21 DIAGNOSIS — J849 Interstitial pulmonary disease, unspecified: Secondary | ICD-10-CM | POA: Diagnosis not present

## 2019-02-21 DIAGNOSIS — K449 Diaphragmatic hernia without obstruction or gangrene: Secondary | ICD-10-CM | POA: Diagnosis not present

## 2019-02-21 DIAGNOSIS — I2510 Atherosclerotic heart disease of native coronary artery without angina pectoris: Secondary | ICD-10-CM | POA: Diagnosis not present

## 2019-02-21 DIAGNOSIS — Z20822 Contact with and (suspected) exposure to covid-19: Secondary | ICD-10-CM | POA: Diagnosis not present

## 2019-02-21 DIAGNOSIS — R079 Chest pain, unspecified: Secondary | ICD-10-CM | POA: Diagnosis not present

## 2019-02-21 DIAGNOSIS — K5730 Diverticulosis of large intestine without perforation or abscess without bleeding: Secondary | ICD-10-CM | POA: Diagnosis not present

## 2019-02-21 DIAGNOSIS — I10 Essential (primary) hypertension: Secondary | ICD-10-CM | POA: Diagnosis not present

## 2019-02-21 DIAGNOSIS — Z79899 Other long term (current) drug therapy: Secondary | ICD-10-CM | POA: Diagnosis not present

## 2019-02-21 DIAGNOSIS — R0602 Shortness of breath: Secondary | ICD-10-CM | POA: Diagnosis not present

## 2019-02-22 DIAGNOSIS — K449 Diaphragmatic hernia without obstruction or gangrene: Secondary | ICD-10-CM | POA: Diagnosis not present

## 2019-02-22 DIAGNOSIS — R079 Chest pain, unspecified: Secondary | ICD-10-CM | POA: Diagnosis not present

## 2019-02-22 DIAGNOSIS — K5730 Diverticulosis of large intestine without perforation or abscess without bleeding: Secondary | ICD-10-CM | POA: Diagnosis not present

## 2019-02-22 DIAGNOSIS — R0600 Dyspnea, unspecified: Secondary | ICD-10-CM | POA: Diagnosis not present

## 2019-02-22 DIAGNOSIS — R1032 Left lower quadrant pain: Secondary | ICD-10-CM | POA: Diagnosis not present

## 2019-02-25 ENCOUNTER — Ambulatory Visit
Admission: EM | Admit: 2019-02-25 | Discharge: 2019-02-25 | Disposition: A | Discharge status: Home / Self Care | Payer: Medicare Other | Attending: Family Medicine | Admitting: Family Medicine

## 2019-02-25 ENCOUNTER — Other Ambulatory Visit: Payer: Self-pay

## 2019-02-25 DIAGNOSIS — I10 Essential (primary) hypertension: Secondary | ICD-10-CM

## 2019-02-25 DIAGNOSIS — J410 Simple chronic bronchitis: Secondary | ICD-10-CM

## 2019-02-25 MED ORDER — DOXYCYCLINE HYCLATE 100 MG PO CAPS: 100.0000 mg | ORAL_CAPSULE | Freq: Two times a day (BID) | ORAL | 0 refills | Status: DC

## 2019-02-25 MED ORDER — DEXAMETHASONE SODIUM PHOSPHATE 10 MG/ML IJ SOLN
10.0000 mg | Freq: Once | INTRAMUSCULAR | Status: AC
  Administered 2019-02-25: 18:00:00 10 mg via INTRAMUSCULAR

## 2019-02-25 NOTE — Discharge Instructions (Addendum)
Doxycycline as prescribed.  I believe that your insomnia and blood pressure is likely related to your depression and issues with your landlord. You may increase the losartan to 100 mg daily.  Consider seeing [facility], [facility], or [facility].  Best of luck.  Dr. Lacinda Axon
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
## 2019-03-10 ENCOUNTER — Ambulatory Visit: Payer: Self-pay

## 2019-03-14 ENCOUNTER — Telehealth: Payer: Self-pay | Admitting: Cardiovascular Disease

## 2019-03-14 NOTE — Telephone Encounter (Signed)
STAT if HR is under 50 or over 120 (normal HR is 60-100 beats per minute)  1) What is your heart rate? 70  131/74. Patient states it has been as high as 200 lately and that her HR is usually in the 50' s 2) Do you have any other symptoms? Patient states she hears pounding in her ears, Pain in back of head  Please advise
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
## 2019-03-16 DIAGNOSIS — J40 Bronchitis, not specified as acute or chronic: Secondary | ICD-10-CM | POA: Diagnosis not present

## 2019-03-18 DIAGNOSIS — L300 Nummular dermatitis: Secondary | ICD-10-CM | POA: Diagnosis not present

## 2019-03-18 DIAGNOSIS — S60512A Abrasion of left hand, initial encounter: Secondary | ICD-10-CM | POA: Diagnosis not present

## 2019-03-18 DIAGNOSIS — D692 Other nonthrombocytopenic purpura: Secondary | ICD-10-CM | POA: Diagnosis not present

## 2019-03-20 ENCOUNTER — Other Ambulatory Visit: Payer: Self-pay | Admitting: Internal Medicine

## 2019-03-20 ENCOUNTER — Ambulatory Visit: Payer: Medicare Other | Admitting: Podiatry

## 2019-03-20 DIAGNOSIS — E039 Hypothyroidism, unspecified: Secondary | ICD-10-CM

## 2019-03-23 NOTE — Telephone Encounter (Signed)
Can we give her some literature in the mail on how to sign up with primary care She can call [facility] Most of her issues are noncardiac at this point Would suggest possibly setting up with primary care at [name] where the surgeon is,  so they can work together

## 2019-03-24 ENCOUNTER — Encounter: Payer: Self-pay | Admitting: *Deleted

## 2019-03-24 NOTE — Telephone Encounter (Signed)
Letter created along with various educational information for patients review. Phone number also provided for PCP and instructions on monitoring. Reviewed with Engineer, building services and will place in outgoing mail today. Educational references on How to Take a Pulse, How the Heart Works, and [facility] form with number enclosed as well.

## 2019-04-05 ENCOUNTER — Other Ambulatory Visit: Payer: Self-pay | Admitting: Internal Medicine

## 2019-04-05 DIAGNOSIS — E039 Hypothyroidism, unspecified: Secondary | ICD-10-CM

## 2019-04-06 ENCOUNTER — Other Ambulatory Visit: Payer: Self-pay

## 2019-04-06 ENCOUNTER — Emergency Department
Admission: EM | Admit: 2019-04-06 | Discharge: 2019-04-06 | Disposition: A | Discharge status: Home / Self Care | Payer: Medicare Other | Attending: Emergency Medicine | Admitting: Emergency Medicine

## 2019-04-06 DIAGNOSIS — Z91010 Allergy to peanuts: Secondary | ICD-10-CM | POA: Insufficient documentation

## 2019-04-06 DIAGNOSIS — J0100 Acute maxillary sinusitis, unspecified: Secondary | ICD-10-CM | POA: Insufficient documentation

## 2019-04-06 DIAGNOSIS — E039 Hypothyroidism, unspecified: Secondary | ICD-10-CM | POA: Insufficient documentation

## 2019-04-06 DIAGNOSIS — Z87891 Personal history of nicotine dependence: Secondary | ICD-10-CM | POA: Diagnosis not present

## 2019-04-06 DIAGNOSIS — I10 Essential (primary) hypertension: Secondary | ICD-10-CM | POA: Insufficient documentation

## 2019-04-06 DIAGNOSIS — Z79899 Other long term (current) drug therapy: Secondary | ICD-10-CM | POA: Diagnosis not present

## 2019-04-06 DIAGNOSIS — Z85828 Personal history of other malignant neoplasm of skin: Secondary | ICD-10-CM | POA: Diagnosis not present

## 2019-04-06 DIAGNOSIS — J3489 Other specified disorders of nose and nasal sinuses: Secondary | ICD-10-CM | POA: Diagnosis present

## 2019-04-06 DIAGNOSIS — J0101 Acute recurrent maxillary sinusitis: Secondary | ICD-10-CM | POA: Diagnosis not present

## 2019-04-06 MED ORDER — AMOXICILLIN-POT CLAVULANATE 875-125 MG PO TABS: 1.0000 | ORAL_TABLET | Freq: Two times a day (BID) | ORAL | 0 refills | Status: AC

## 2019-04-06 MED ORDER — FLUCONAZOLE 150 MG PO TABS: 150.0000 mg | ORAL_TABLET | Freq: Every day | ORAL | 0 refills | Status: DC

## 2019-04-06 NOTE — ED Triage Notes (Signed)
Pt states facial pain and pressure and L sided head pain. Pt A&O, ambulatory with walker. Appears congested.

## 2019-04-06 NOTE — ED Provider Notes (Signed)
Emergency Department Provider Note  ____________________________________________  Time seen: Approximately 8:25 PM  I have reviewed the triage vital signs and the nursing notes.   HISTORY  Chief Complaint Sinus Problem   Historian Patient     HPI Angela [name] is a 69 y.o. female presents to the emergency department with frontal and maxillary sinus tenderness as well as purulent rhinorrhea.  Patient states that she typically experiences sinusitis tingling.  Patient also states that she is presenting to the ED because she is in the process of being admitted department and would like a note saying that she needs time to rest and heal.  She denies weakness of the upper and lower extremities, chest pain, chest tightness or abdominal pain.  She has been afebrile at home.   Past Medical History:  Diagnosis Date  . Allergic rhinitis   . Anemia   . Blurred vision   . Depression   . Diverticulosis   . Endometriosis   . Falls   . GERD (gastroesophageal reflux disease)   . Hernia, hiatal   . Hypothyroidism   . IBS (irritable bowel syndrome)   . Malignant neoplasm of skin   . Migraine   . Neuropathy   . PTSD (post-traumatic stress disorder)   . Thyroid disease      Immunizations up to date:  Yes.     Past Medical History:  Diagnosis Date  . Allergic rhinitis   . Anemia   . Blurred vision   . Depression   . Diverticulosis   . Endometriosis   . Falls   . GERD (gastroesophageal reflux disease)   . Hernia, hiatal   . Hypothyroidism   . IBS (irritable bowel syndrome)   . Malignant neoplasm of skin   . Migraine   . Neuropathy   . PTSD (post-traumatic stress disorder)   . Thyroid disease     Patient Active Problem List   Diagnosis Date Noted  . Labile mood   . Altered mental status, unspecified 12/25/2018  . Confusion state 12/25/2018  . Abnormal TSH 12/25/2018  . Acquired claw toe of both feet 10/23/2018  . Dermatitis 10/22/2018  . Cough productive  of yellow sputum 08/08/2018  . Malnutrition of mild degree ([place]) 08/08/2018  . Mixed stress and urge urinary incontinence 08/08/2018  . Magnesium deficiency 08/08/2018  . Aortic atherosclerosis ([place]) 05/31/2018  . Chest pain of uncertain etiology [id number]  . Achilles tendon disorder 04/25/2018  . Insomnia 04/25/2018  . Diastolic dysfunction [id number]  . Mitral regurgitation 03/29/2018  . Bronchospasm 03/15/2018  . Axis II diagnosis deferred 02/20/2018  . Preventative health care 02/08/2018  . Polypharmacy 02/01/2018  . Personality disorder ([place]) 12/25/2017  . Noncompliance 12/25/2017  . Medication monitoring encounter 12/07/2017  . Family history of high cholesterol 12/07/2017  . Arthritis of hand, degenerative 12/07/2017  . Bipolar I disorder, most recent episode mixed, severe with psychotic features ([place]) 09/18/2017  . Agitation 09/18/2017  . Depressed mood 07/11/2017  . Hallux valgus, acquired 07/05/2017  . Hammer toes of both feet 07/05/2017  . Unstable ankle 07/05/2017  . Chronic bilateral low back pain without sciatica 06/09/2017  . Tinea unguium 05/08/2017  . Conductive hearing loss of right ear with unrestricted hearing of left ear 04/03/2017  . Adjustment disorder with depressed mood 01/18/2017  . Migraine 01/18/2017  . IBS (irritable bowel syndrome) 11/17/2016  . Bilateral carpal tunnel syndrome 11/17/2016  . Plantar fasciitis, bilateral 11/17/2016  . Cervical myofascial pain syndrome 11/17/2016  . Chronic  hyponatremia 11/17/2016  . PTSD (post-traumatic stress disorder) 11/17/2016  . Hiatal hernia 11/17/2016  . Vitamin D deficiency 11/17/2016  . Vitamin B12 deficiency 11/17/2016  . SIADH (syndrome of inappropriate ADH production) ([place]) 09/08/2016  . Cervical spondylosis with myelopathy 05/24/2016  . MCI (mild cognitive impairment) with memory loss 03/30/2016  . Gait abnormality 03/30/2016  . Chronic bipolar affective disorder ([place]) 01/24/2016  . Recurrent  major depressive disorder, in partial remission ([place]) 01/24/2016  . Anemia, iron deficiency 06/24/2015  . Benign essential HTN 06/24/2015  . History of falling 05/22/2015  . History of TIA (transient ischemic attack) 04/20/2015  . Hypothyroidism 04/20/2015  . GERD (gastroesophageal reflux disease) 04/20/2015  . Cervical disc disorder at C5-C6 level with radiculopathy 03/25/2015  . Chronic neck pain 03/25/2015  . Pelvic pain in female 10/30/2014  . History of skin cancer 06/30/2014  . Chronic sinusitis 06/30/2014  . Chronic headaches 12/16/2013  . Neuropathy 12/16/2013    Past Surgical History:  Procedure Laterality Date  . APPENDECTOMY    . CHOLECYSTECTOMY    . CHOLECYSTECTOMY, LAPAROSCOPIC    . ESOPHAGOGASTRODUODENOSCOPY (EGD) WITH PROPOFOL N/A 03/29/2015   Procedure: ESOPHAGOGASTRODUODENOSCOPY (EGD) WITH PROPOFOL;  Surgeon: Josefine Class, MD;  Location: [facility] ENDOSCOPY;  Service: Endoscopy;  Laterality: N/A;  . HERNIA REPAIR    . laproscopy    . TONSILLECTOMY    . uteral suspension      Prior to Admission medications   Medication Sig Start Date End Date Taking? Authorizing Provider  acetaminophen (TYLENOL) 650 MG CR tablet Take 650 mg by mouth every 8 (eight) hours as needed for pain.    [provider]  Acetylcysteine (NAC) 500 MG CAPS Take 500 mg by mouth 2 (two) times a day.    [provider]  albuterol (VENTOLIN HFA) 108 (90 Base) MCG/ACT inhaler Inhale 2 puffs into the lungs every 4 (four) hours as needed for wheezing or shortness of breath. 11/27/18   Crecencio Mc, MD  Amino Acids-Protein Hydrolys (FEEDING SUPPLEMENT, PRO-STAT 64,) LIQD Take 30 mLs by mouth daily. 04/12/18   Poulose, Bethel Born, NP  amoxicillin-clavulanate (AUGMENTIN) 875-125 MG tablet Take 1 tablet by mouth 2 (two) times daily for 10 days. 04/06/19 04/16/19  Lannie Fields, PA-C  atorvastatin (LIPITOR) 20 MG tablet Take 1 tablet (20 mg total) by mouth at bedtime. 11/26/18   Crecencio Mc, MD  Biotin 10000 MCG TABS Take 10,000 mcg by mouth daily.     [provider]  Calcium Citrate (CITRACAL PO) Take 800 mg by mouth. Takes 2 (400mg ) tablets BID    [provider]  Cholecalciferol 25 MCG (1000 UT) tablet Take 1 tablet (1,000 Units total) by mouth daily. 12/26/17   Clapacs, Madie Reno, MD  Cyanocobalamin (VITAMIN B-12) 1000 MCG SUBL Place 1,000 mcg under the tongue.     [provider]  diclofenac sodium (VOLTAREN) 1 % GEL Apply small amount (2 grams) topically over knees or hands up to four times a day if needed for arthritis 11/26/18   Crecencio Mc, MD  doxycycline (VIBRAMYCIN) 100 MG capsule Take 1 capsule (100 mg total) by mouth 2 (two) times daily. 02/25/19   Coral Spikes, DO  famotidine (PEPCID) 20 MG tablet Take 1 tablet (20 mg total) by mouth 2 (two) times daily as needed for heartburn or indigestion. 11/26/18   Crecencio Mc, MD  ferrous sulfate 325 (65 FE) MG tablet Take 1 tablet (325 mg total) by mouth daily  with breakfast. 11/26/18   Crecencio Mc, MD  fluconazole (DIFLUCAN) 150 MG tablet Take 1 tablet (150 mg total) by mouth daily. 04/06/19   Lannie Fields, PA-C  hydrOXYzine (ATARAX/VISTARIL) 10 MG tablet TAKE 1 TO 2 TABLETS BY MOUTH ONCE DAILY AS NEEDED 02/05/18   [provider]  Icosapent Ethyl (VASCEPA) 1 g CAPS TAKE 2 CAPSULES BY MOUTH TWICE DAILY (TO HELP WITH TRIGLYCERIDES) 11/26/18   Crecencio Mc, MD  Lactulose 20 GM/30ML SOLN 30 ml every 4 hours until constipation is relieved 11/08/18   Crecencio Mc, MD  levalbuterol (XOPENEX) 0.63 MG/3ML nebulizer solution Take 3 mLs (0.63 mg total) by nebulization every 4 (four) hours as needed for wheezing or shortness of breath. 11/26/18   Crecencio Mc, MD  levothyroxine (SYNTHROID) 50 MCG tablet Take 1 tablet (50 mcg total) by mouth daily at 6 (six) AM. Patient taking differently: Take 100 mcg by mouth daily at 6 (six) AM.  12/28/18   Nolberto Hanlon, MD  linaclotide  Rolan Lipa) 290 MCG CAPS capsule Take 1 capsule (290 mcg total) by mouth daily before breakfast. 12/26/17   Clapacs, Madie Reno, MD  LORazepam (ATIVAN) 0.5 MG tablet TAKE 1 TABLET BY MOUTH AS NEEDED ONCE DAILY 02/06/18   [provider]  losartan (COZAAR) 50 MG tablet Take 1 tablet (50 mg total) by mouth daily. 11/26/18 02/24/19  Crecencio Mc, MD  Magnesium 500 MG TABS Take 500 mg by mouth every other day.    [provider]  PARoxetine (PAXIL) 20 MG tablet Take 20 mg by mouth daily.    [provider]  prednisoLONE acetate (PRED FORTE) 1 % ophthalmic suspension INSTILL 1 DROP INTO EACH EYE TWICE DAILY 07/26/18   [provider]  pregabalin (LYRICA) 75 MG capsule Take 75 mg by mouth 2 (two) times daily.     [provider]  PREMARIN vaginal cream Place 1 Applicatorful vaginally 2 (two) times a week. 01/30/19   Rubie Maid, MD  promethazine (PHENERGAN) 12.5 MG tablet Take 12.5 mg by mouth every 6 (six) hours as needed for nausea or vomiting.    [provider]  pyridoxine (B-6) 100 MG tablet Take 1 tablet (100 mg total) by mouth 2 (two) times daily. 12/26/17   Clapacs, Madie Reno, MD  tiZANidine (ZANAFLEX) 2 MG tablet Take by mouth every 6 (six) hours as needed for muscle spasms.    [provider]  triamcinolone ointment (KENALOG) 0.5 % Apply 1 application topically 2 (two) times daily. 11/26/18   Crecencio Mc, MD  umeclidinium bromide (INCRUSE ELLIPTA) 62.5 MCG/INH AEPB Inhale 1 puff into the lungs daily. 11/26/18   Crecencio Mc, MD  XIIDRA 5 % SOLN INSTILL 1 DROP INTO EACH EYE TWICE DAILY 07/27/18   [provider]  traZODone (DESYREL) 50 MG tablet Take 1 tablet (50 mg total) by mouth at bedtime as needed. Pt takes 1/2 tablet qhs 11/26/18 02/25/19  Crecencio Mc, MD    Allergies Advair diskus [fluticasone-salmeterol], Bee venom, Darvon [propoxyphene], Dicyclomine, Other, Oxycodone-acetaminophen, Peanuts [peanut oil], Penicillins,  Sulfa antibiotics, Sulfacetamide sodium, Ultram [tramadol], Amikacin, Aspirin, Haloperidol, Hymenoptera venom preparations, Imipramine, Keflex [cephalexin], Lactose, Lactose intolerance (gi), Risperdal [risperidone], Sulfur, Adhesive [tape], Albuterol, Ciprofloxacin, Hydroxyzine, Peanut butter flavor, and Prednisone  Family History  Problem Relation Age of Onset  . Heart disease Father   . Hearing loss Father   . COPD Father   . Depression Father   . Stroke Father   .  Vision loss Father   . Varicose Veins Father   . Cancer Mother        lung  . Depression Sister   . Arthritis Sister   . Arthritis Brother   . Hernia Brother   . Anxiety disorder Brother   . Arthritis Brother   . Hernia Brother   . Anxiety disorder Brother   . Urolithiasis Neg Hx   . Kidney disease Neg Hx   . Kidney cancer Neg Hx   . Prostate cancer Neg Hx     Social History Social History   Tobacco Use  . Smoking status: Former Smoker    Quit date: 11/17/1976    Years since quitting: 42.4  . Smokeless tobacco: Never Used  Substance Use Topics  . Alcohol use: Yes    Comment: occ  . Drug use: No     Review of Systems  Constitutional: No fever/chills Eyes:  No discharge ENT: Patient has maxillary and frontal sinus tenderness. Respiratory: no cough. No SOB/ use of accessory muscles to breath Gastrointestinal:   No nausea, no vomiting.  No diarrhea.  No constipation. Musculoskeletal: Negative for musculoskeletal pain. Skin: Negative for rash, abrasions, lacerations, ecchymosis.    ____________________________________________   PHYSICAL EXAM:  VITAL SIGNS: ED Triage Vitals  Enc Vitals Group     BP 04/06/19 1719 (!) 155/95     Pulse Rate 04/06/19 1719 73     Resp 04/06/19 1719 16     Temp 04/06/19 1719 98.6 F (37 C)     Temp Source 04/06/19 1719 Oral     SpO2 04/06/19 1719 100 %     Weight 04/06/19 1720 145 lb (65.8 kg)     Height 04/06/19 1720 5\' 3"  (1.6 m)     Head Circumference --       Peak Flow --      Pain Score 04/06/19 1735 9     Pain Loc --      Pain Edu? --      Excl. in [place]? --      Constitutional: Alert and oriented. Well appearing and in no acute distress. Eyes: Conjunctivae are normal. PERRL. EOMI. Head: Atraumatic.  Patient has maxillary and frontal sinus tenderness to palpation. ENT:      Nose: No congestion/rhinnorhea.      Mouth/Throat: Mucous membranes are moist.  Neck: No stridor.  No cervical spine tenderness to palpation. Cardiovascular: Normal rate, regular rhythm. Normal S1 and S2.  Good peripheral circulation. Respiratory: Normal respiratory effort without tachypnea or retractions. Lungs CTAB. Good air entry to the bases with no decreased or absent breath sounds Gastrointestinal: Bowel sounds x 4 quadrants. Soft and nontender to palpation. No guarding or rigidity. No distention. Musculoskeletal: Full range of motion to all extremities. No obvious deformities noted Neurologic:  Normal for age. No gross focal neurologic deficits are appreciated.  Skin:  Skin is warm, dry and intact. No rash noted. Psychiatric: Mood and affect are normal for age. Speech and behavior are normal.   ____________________________________________   LABS (all labs ordered are listed, but only abnormal results are displayed)  Labs Reviewed - No data to display ____________________________________________  EKG   ____________________________________________  RADIOLOGY  No results found.  ____________________________________________    PROCEDURES  Procedure(s) performed:     Procedures     Medications - No data to display   ____________________________________________   INITIAL IMPRESSION / ASSESSMENT AND PLAN / ED COURSE  Pertinent labs & imaging results that were available  during my care of the patient were reviewed by me and considered in my medical decision making (see chart for details).      Assessment and plan Sinusitis 69 year old  female presents to the emergency department with maxillary and frontal sinus tenderness for the past 2 to 3 days.  Patient states that when she was a child, she was documented to have penicillin allergy when she was a child but states that she only experienced rash and has no history of anaphylaxis with penicillin.  She was discharged with Augmentin for sinusitis and a note stating that she needed time to rest and heal was given.   ____________________________________________  FINAL CLINICAL IMPRESSION(S) / ED DIAGNOSES  Final diagnoses:  Acute maxillary sinusitis, recurrence not specified      NEW MEDICATIONS STARTED DURING THIS VISIT:  ED Discharge Orders         Ordered    amoxicillin-clavulanate (AUGMENTIN) 875-125 MG tablet  2 times daily     04/06/19 2018    fluconazole (DIFLUCAN) 150 MG tablet  Daily     04/06/19 2021              This chart was dictated using voice recognition software/Dragon. Despite best efforts to proofread, errors can occur which can change the meaning. Any change was purely unintentional.     Karren Cobble 04/06/19 2042    Harvest Dark, MD 04/06/19 [phone]

## 2019-04-07 ENCOUNTER — Other Ambulatory Visit: Payer: Self-pay | Admitting: Family Medicine

## 2019-04-07 DIAGNOSIS — H524 Presbyopia: Secondary | ICD-10-CM | POA: Diagnosis not present

## 2019-04-07 DIAGNOSIS — E039 Hypothyroidism, unspecified: Secondary | ICD-10-CM

## 2019-04-08 ENCOUNTER — Other Ambulatory Visit: Payer: Self-pay | Admitting: Internal Medicine

## 2019-04-08 DIAGNOSIS — K219 Gastro-esophageal reflux disease without esophagitis: Secondary | ICD-10-CM

## 2019-04-09 DIAGNOSIS — T7840XA Allergy, unspecified, initial encounter: Secondary | ICD-10-CM | POA: Diagnosis not present

## 2019-04-09 DIAGNOSIS — E039 Hypothyroidism, unspecified: Secondary | ICD-10-CM | POA: Diagnosis not present

## 2019-04-09 DIAGNOSIS — I10 Essential (primary) hypertension: Secondary | ICD-10-CM | POA: Diagnosis not present

## 2019-04-09 DIAGNOSIS — K529 Noninfective gastroenteritis and colitis, unspecified: Secondary | ICD-10-CM | POA: Diagnosis not present

## 2019-04-09 DIAGNOSIS — R0789 Other chest pain: Secondary | ICD-10-CM | POA: Diagnosis not present

## 2019-04-09 DIAGNOSIS — R072 Precordial pain: Secondary | ICD-10-CM | POA: Diagnosis not present

## 2019-04-09 DIAGNOSIS — Z9049 Acquired absence of other specified parts of digestive tract: Secondary | ICD-10-CM | POA: Diagnosis not present

## 2019-04-09 DIAGNOSIS — Z880 Allergy status to penicillin: Secondary | ICD-10-CM | POA: Diagnosis not present

## 2019-04-09 DIAGNOSIS — Z885 Allergy status to narcotic agent status: Secondary | ICD-10-CM | POA: Diagnosis not present

## 2019-04-09 DIAGNOSIS — R0902 Hypoxemia: Secondary | ICD-10-CM | POA: Diagnosis not present

## 2019-04-09 DIAGNOSIS — Z882 Allergy status to sulfonamides status: Secondary | ICD-10-CM | POA: Diagnosis not present

## 2019-04-09 DIAGNOSIS — J329 Chronic sinusitis, unspecified: Secondary | ICD-10-CM | POA: Diagnosis not present

## 2019-04-09 DIAGNOSIS — R519 Headache, unspecified: Secondary | ICD-10-CM | POA: Diagnosis not present

## 2019-04-09 DIAGNOSIS — R0989 Other specified symptoms and signs involving the circulatory and respiratory systems: Secondary | ICD-10-CM | POA: Diagnosis not present

## 2019-04-09 DIAGNOSIS — K449 Diaphragmatic hernia without obstruction or gangrene: Secondary | ICD-10-CM | POA: Diagnosis not present

## 2019-04-09 DIAGNOSIS — R0602 Shortness of breath: Secondary | ICD-10-CM | POA: Diagnosis not present

## 2019-04-09 DIAGNOSIS — Z20822 Contact with and (suspected) exposure to covid-19: Secondary | ICD-10-CM | POA: Diagnosis not present

## 2019-04-09 DIAGNOSIS — Z743 Need for continuous supervision: Secondary | ICD-10-CM | POA: Diagnosis not present

## 2019-04-09 DIAGNOSIS — Z79899 Other long term (current) drug therapy: Secondary | ICD-10-CM | POA: Diagnosis not present

## 2019-04-09 DIAGNOSIS — Z87891 Personal history of nicotine dependence: Secondary | ICD-10-CM | POA: Diagnosis not present

## 2019-04-09 DIAGNOSIS — R918 Other nonspecific abnormal finding of lung field: Secondary | ICD-10-CM | POA: Diagnosis not present

## 2019-04-09 DIAGNOSIS — Z886 Allergy status to analgesic agent status: Secondary | ICD-10-CM | POA: Diagnosis not present

## 2019-04-09 DIAGNOSIS — R21 Rash and other nonspecific skin eruption: Secondary | ICD-10-CM | POA: Diagnosis not present

## 2019-04-09 DIAGNOSIS — Z8673 Personal history of transient ischemic attack (TIA), and cerebral infarction without residual deficits: Secondary | ICD-10-CM | POA: Diagnosis not present

## 2019-04-11 DIAGNOSIS — R0602 Shortness of breath: Secondary | ICD-10-CM | POA: Diagnosis not present

## 2019-04-11 DIAGNOSIS — I10 Essential (primary) hypertension: Secondary | ICD-10-CM | POA: Diagnosis not present

## 2019-04-11 DIAGNOSIS — Z79899 Other long term (current) drug therapy: Secondary | ICD-10-CM | POA: Diagnosis not present

## 2019-04-11 DIAGNOSIS — R069 Unspecified abnormalities of breathing: Secondary | ICD-10-CM | POA: Diagnosis not present

## 2019-04-11 DIAGNOSIS — Z8673 Personal history of transient ischemic attack (TIA), and cerebral infarction without residual deficits: Secondary | ICD-10-CM | POA: Diagnosis not present

## 2019-04-11 DIAGNOSIS — Z888 Allergy status to other drugs, medicaments and biological substances status: Secondary | ICD-10-CM | POA: Diagnosis not present

## 2019-04-11 DIAGNOSIS — Z91048 Other nonmedicinal substance allergy status: Secondary | ICD-10-CM | POA: Diagnosis not present

## 2019-04-11 DIAGNOSIS — R0600 Dyspnea, unspecified: Secondary | ICD-10-CM | POA: Diagnosis not present

## 2019-04-11 DIAGNOSIS — Z886 Allergy status to analgesic agent status: Secondary | ICD-10-CM | POA: Diagnosis not present

## 2019-04-11 DIAGNOSIS — Z882 Allergy status to sulfonamides status: Secondary | ICD-10-CM | POA: Diagnosis not present

## 2019-04-11 DIAGNOSIS — Z91010 Allergy to peanuts: Secondary | ICD-10-CM | POA: Diagnosis not present

## 2019-04-11 DIAGNOSIS — Z87891 Personal history of nicotine dependence: Secondary | ICD-10-CM | POA: Diagnosis not present

## 2019-04-11 DIAGNOSIS — Z880 Allergy status to penicillin: Secondary | ICD-10-CM | POA: Diagnosis not present

## 2019-04-11 DIAGNOSIS — Z885 Allergy status to narcotic agent status: Secondary | ICD-10-CM | POA: Diagnosis not present

## 2019-04-11 DIAGNOSIS — Z743 Need for continuous supervision: Secondary | ICD-10-CM | POA: Diagnosis not present

## 2019-04-11 DIAGNOSIS — E039 Hypothyroidism, unspecified: Secondary | ICD-10-CM | POA: Diagnosis not present

## 2019-04-12 DIAGNOSIS — R0602 Shortness of breath: Secondary | ICD-10-CM | POA: Diagnosis not present

## 2019-04-22 ENCOUNTER — Emergency Department
Admission: EM | Admit: 2019-04-22 | Discharge: 2019-04-22 | Disposition: A | Discharge status: Home / Self Care | Payer: Medicare Other | Attending: Emergency Medicine | Admitting: Emergency Medicine

## 2019-04-22 ENCOUNTER — Telehealth: Payer: Self-pay | Admitting: Cardiology

## 2019-04-22 ENCOUNTER — Emergency Department (HOSPITAL_COMMUNITY): Payer: Medicare Other

## 2019-04-22 ENCOUNTER — Encounter (HOSPITAL_COMMUNITY): Payer: Self-pay | Admitting: Emergency Medicine

## 2019-04-22 ENCOUNTER — Other Ambulatory Visit: Payer: Self-pay

## 2019-04-22 ENCOUNTER — Emergency Department: Payer: Medicare Other

## 2019-04-22 ENCOUNTER — Telehealth: Payer: Self-pay | Admitting: Cardiovascular Disease

## 2019-04-22 ENCOUNTER — Emergency Department (HOSPITAL_COMMUNITY)
Admission: EM | Admit: 2019-04-22 | Discharge: 2019-04-23 | Disposition: A | Discharge status: Home / Self Care | Payer: Medicare Other | Attending: Emergency Medicine | Admitting: Emergency Medicine

## 2019-04-22 DIAGNOSIS — Z5321 Procedure and treatment not carried out due to patient leaving prior to being seen by health care provider: Secondary | ICD-10-CM | POA: Insufficient documentation

## 2019-04-22 DIAGNOSIS — Z87891 Personal history of nicotine dependence: Secondary | ICD-10-CM | POA: Insufficient documentation

## 2019-04-22 DIAGNOSIS — F319 Bipolar disorder, unspecified: Secondary | ICD-10-CM | POA: Insufficient documentation

## 2019-04-22 DIAGNOSIS — Z79899 Other long term (current) drug therapy: Secondary | ICD-10-CM | POA: Insufficient documentation

## 2019-04-22 DIAGNOSIS — R0981 Nasal congestion: Secondary | ICD-10-CM | POA: Insufficient documentation

## 2019-04-22 DIAGNOSIS — E039 Hypothyroidism, unspecified: Secondary | ICD-10-CM | POA: Diagnosis not present

## 2019-04-22 DIAGNOSIS — R079 Chest pain, unspecified: Secondary | ICD-10-CM | POA: Diagnosis not present

## 2019-04-22 DIAGNOSIS — K449 Diaphragmatic hernia without obstruction or gangrene: Secondary | ICD-10-CM | POA: Diagnosis not present

## 2019-04-22 DIAGNOSIS — Z77120 Contact with and (suspected) exposure to mold (toxic): Secondary | ICD-10-CM | POA: Diagnosis not present

## 2019-04-22 DIAGNOSIS — R0789 Other chest pain: Secondary | ICD-10-CM | POA: Diagnosis not present

## 2019-04-22 LAB — BASIC METABOLIC PANEL
Anion gap: 11 (ref 5–15)
Anion gap: 11 (ref 5–15)
BUN: 9 mg/dL (ref 8–23)
BUN: 7 mg/dL — ABNORMAL LOW (ref 8–23)
CO2: 24 mmol/L (ref 22–32)
CO2: 24 mmol/L (ref 22–32)
Calcium: 8.6 mg/dL — ABNORMAL LOW (ref 8.9–10.3)
Calcium: 9 mg/dL (ref 8.9–10.3)
Chloride: 97 mmol/L — ABNORMAL LOW (ref 98–111)
Chloride: 101 mmol/L (ref 98–111)
Creatinine, Ser: 0.57 mg/dL (ref 0.44–1.00)
Creatinine, Ser: 0.64 mg/dL (ref 0.44–1.00)
GFR calc Af Amer: >60 mL/min (ref >60)
GFR calc Af Amer: >60 mL/min (ref >60)
GFR calc non Af Amer: >60 mL/min (ref >60)
GFR calc non Af Amer: >60 mL/min (ref >60)
Glucose, Bld: 149 mg/dL — ABNORMAL HIGH (ref 70–99)
Glucose, Bld: 103 mg/dL — ABNORMAL HIGH (ref 70–99)
Potassium: 3.3 mmol/L — ABNORMAL LOW (ref 3.5–5.1)
Potassium: 3.8 mmol/L (ref 3.5–5.1)
Sodium: 132 mmol/L — ABNORMAL LOW (ref 135–145)
Sodium: 136 mmol/L (ref 135–145)

## 2019-04-22 LAB — CBC
HCT: 37.5 % (ref 36.0–46.0)
HCT: 38.7 % (ref 36.0–46.0)
Hemoglobin: 12.4 g/dL (ref 12.0–15.0)
Hemoglobin: 12.7 g/dL (ref 12.0–15.0)
MCH: 31 pg (ref 26.0–34.0)
MCH: 30.5 pg (ref 26.0–34.0)
MCHC: 33.1 g/dL (ref 30.0–36.0)
MCHC: 32.8 g/dL (ref 30.0–36.0)
MCV: 93.8 fL (ref 80.0–100.0)
MCV: 93 fL (ref 80.0–100.0)
Platelets: 268 10*3/uL (ref 150–400)
Platelets: 318 10*3/uL (ref 150–400)
RBC: 4 MIL/uL (ref 3.87–5.11)
RBC: 4.16 MIL/uL (ref 3.87–5.11)
RDW: 13.3 % (ref 11.5–15.5)
RDW: 13.4 % (ref 11.5–15.5)
WBC: 9.8 10*3/uL (ref 4.0–10.5)
WBC: 10.2 10*3/uL (ref 4.0–10.5)
nRBC: 0 % (ref 0.0–0.2)
nRBC: 0 % (ref 0.0–0.2)

## 2019-04-22 LAB — TROPONIN I (HIGH SENSITIVITY)
Troponin I (High Sensitivity): <2 ng/L (ref <18)
Troponin I (High Sensitivity): 3 ng/L (ref <18)
Troponin I (High Sensitivity): 10 ng/L (ref <18)

## 2019-04-22 MED ORDER — ONDANSETRON 4 MG PO TBDP
4.0000 mg | ORAL_TABLET | Freq: Once | ORAL | Status: AC | PRN
  Administered 2019-04-22: 4 mg via ORAL
  Filled 2019-04-22: qty 1

## 2019-04-22 MED ORDER — SODIUM CHLORIDE 0.9% FLUSH: 3.0000 mL | Freq: Once | INTRAVENOUS | Status: DC

## 2019-04-22 NOTE — Telephone Encounter (Signed)
Pt called in reporting she is experiencing chest pain and vomiting. Sitting in the waiting room at [facility] waiting to be seen. I advised that she continue to be evaluated there. She is frustrated with the wait time and wanting to be admitted. Asking that we admit her. I advised I could not admit at [facility] and she would need to be evaluated by the EDP there. She voiced she was going to sign out and come to [facility] instead. Asking about calling EMS from the waiting room. I advised I wasn't sure if they would transport her to another facility. Her friend is now going to drive her.

## 2019-04-22 NOTE — ED Notes (Signed)
Patient came to the desk and advised she was leaving. First nurse made aware of same.

## 2019-04-22 NOTE — Telephone Encounter (Signed)
Patient requesting a call back due to a letter she received in the mail. Patient also stating she is heart pain but will not discuss any further. Only would like to speak the nurse and Dr. Rockey Situ  Please advise

## 2019-04-22 NOTE — ED Triage Notes (Signed)
Reports left sided chest pain for the last few days, increasing in pain over the last few hours. Pt had emesis X 1 en route. Continues to c/o chest pain. Pt alert and oriented X4, cooperative, RR even and unlabored, color WNL. Pt in NAD.

## 2019-04-22 NOTE — ED Triage Notes (Signed)
Pt c/o left cp with some nausea, vomiting and sob for the past week not getting any better.

## 2019-04-22 NOTE — Telephone Encounter (Addendum)
No answer/Voicemail box is full.  

## 2019-04-23 DIAGNOSIS — J301 Allergic rhinitis due to pollen: Secondary | ICD-10-CM | POA: Diagnosis not present

## 2019-04-23 DIAGNOSIS — H72829 Total perforations of tympanic membrane, unspecified ear: Secondary | ICD-10-CM | POA: Diagnosis not present

## 2019-04-23 DIAGNOSIS — R0789 Other chest pain: Secondary | ICD-10-CM | POA: Diagnosis not present

## 2019-04-23 DIAGNOSIS — J340 Abscess, furuncle and carbuncle of nose: Secondary | ICD-10-CM | POA: Diagnosis not present

## 2019-04-23 MED ORDER — IPRATROPIUM BROMIDE HFA 17 MCG/ACT IN AERS
2.0000 | INHALATION_SPRAY | Freq: Once | RESPIRATORY_TRACT | Status: AC
  Administered 2019-04-23: 2 via RESPIRATORY_TRACT
  Filled 2019-04-23: qty 12.9

## 2019-04-23 MED ORDER — ONDANSETRON 4 MG PO TBDP
4.0000 mg | ORAL_TABLET | Freq: Once | ORAL | Status: AC
  Administered 2019-04-23: 4 mg via ORAL
  Filled 2019-04-23: qty 1

## 2019-04-23 MED ORDER — DEXAMETHASONE SODIUM PHOSPHATE 10 MG/ML IJ SOLN
10.0000 mg | Freq: Once | INTRAMUSCULAR | Status: AC
  Administered 2019-04-23: 10 mg via INTRAMUSCULAR
  Filled 2019-04-23: qty 1

## 2019-04-23 NOTE — ED Provider Notes (Signed)
[facility] EMERGENCY DEPARTMENT Provider Note   CSN: [id number] Arrival date & time: 04/22/19  1942     History Chief Complaint  Patient presents with  . Chest Pain    Angela [name] is a 69 y.o. female.  HPI  HPI: A 69 year old patient with a history of hypertension presents for evaluation of chest pain. Initial onset of pain was more than 6 hours ago. The patient's chest pain is sharp and is not worse with exertion. The patient's chest pain is middle- or left-sided, is not well-localized, is not described as heaviness/pressure/tightness and does radiate to the arms/jaw/neck. The patient does not complain of nausea and denies diaphoresis. The patient has no history of stroke, has no history of peripheral artery disease, has not smoked in the past 90 days, denies any history of treated diabetes, has no relevant family history of coronary artery disease (first degree relative at less than age 56), has no history of hypercholesterolemia and does not have an elevated BMI (>=30).   Patient reports that she has been having left-sided chest pain for the last 6 days.  The pain is constant.  It is left-sided.  The pain has no specific evoking, aggravating or relieving factors.  She denies any history of similar pain.  She denies any CAD history.  She has TIA history.  She also reports that she is having shortness of breath and thinks that it is because of mold exposure.  She was recently moved to a housing that is infested with mold.  She also suspects that she is having sinus infection.  She is on antibiotics for it and had seen [facility] for those complaints last week.  Patient has history of inguinal hernia.  She is passing flatus and she denies any vomiting.  P.o. intake has been normal.  Past Medical History:  Diagnosis Date  . Allergic rhinitis   . Anemia   . Blurred vision   . Depression   . Diverticulosis   . Endometriosis   . Falls   . GERD (gastroesophageal  reflux disease)   . Hernia, hiatal   . Hypothyroidism   . IBS (irritable bowel syndrome)   . Malignant neoplasm of skin   . Migraine   . Neuropathy   . PTSD (post-traumatic stress disorder)   . Thyroid disease     Patient Active Problem List   Diagnosis Date Noted  . Labile mood   . Altered mental status, unspecified 12/25/2018  . Confusion state 12/25/2018  . Abnormal TSH 12/25/2018  . Acquired claw toe of both feet 10/23/2018  . Dermatitis 10/22/2018  . Cough productive of yellow sputum 08/08/2018  . Malnutrition of mild degree ([place]) 08/08/2018  . Mixed stress and urge urinary incontinence 08/08/2018  . Magnesium deficiency 08/08/2018  . Aortic atherosclerosis ([place]) 05/31/2018  . Chest pain of uncertain etiology [id number]  . Achilles tendon disorder 04/25/2018  . Insomnia 04/25/2018  . Diastolic dysfunction [id number]  . Mitral regurgitation 03/29/2018  . Bronchospasm 03/15/2018  . Axis II diagnosis deferred 02/20/2018  . Preventative health care 02/08/2018  . Polypharmacy 02/01/2018  . Personality disorder ([place]) 12/25/2017  . Noncompliance 12/25/2017  . Medication monitoring encounter 12/07/2017  . Family history of high cholesterol 12/07/2017  . Arthritis of hand, degenerative 12/07/2017  . Bipolar I disorder, most recent episode mixed, severe with psychotic features ([place]) 09/18/2017  . Agitation 09/18/2017  . Depressed mood 07/11/2017  . Hallux valgus, acquired 07/05/2017  . Hammer toes of both  feet 07/05/2017  . Unstable ankle 07/05/2017  . Chronic bilateral low back pain without sciatica 06/09/2017  . Tinea unguium 05/08/2017  . Conductive hearing loss of right ear with unrestricted hearing of left ear 04/03/2017  . Adjustment disorder with depressed mood 01/18/2017  . Migraine 01/18/2017  . IBS (irritable bowel syndrome) 11/17/2016  . Bilateral carpal tunnel syndrome 11/17/2016  . Plantar fasciitis, bilateral 11/17/2016  . Cervical myofascial pain syndrome  11/17/2016  . Chronic hyponatremia 11/17/2016  . PTSD (post-traumatic stress disorder) 11/17/2016  . Hiatal hernia 11/17/2016  . Vitamin D deficiency 11/17/2016  . Vitamin B12 deficiency 11/17/2016  . SIADH (syndrome of inappropriate ADH production) ([place]) 09/08/2016  . Cervical spondylosis with myelopathy 05/24/2016  . MCI (mild cognitive impairment) with memory loss 03/30/2016  . Gait abnormality 03/30/2016  . Chronic bipolar affective disorder ([place]) 01/24/2016  . Recurrent major depressive disorder, in partial remission ([place]) 01/24/2016  . Anemia, iron deficiency 06/24/2015  . Benign essential HTN 06/24/2015  . History of falling 05/22/2015  . History of TIA (transient ischemic attack) 04/20/2015  . Hypothyroidism 04/20/2015  . GERD (gastroesophageal reflux disease) 04/20/2015  . Cervical disc disorder at C5-C6 level with radiculopathy 03/25/2015  . Chronic neck pain 03/25/2015  . Pelvic pain in female 10/30/2014  . History of skin cancer 06/30/2014  . Chronic sinusitis 06/30/2014  . Chronic headaches 12/16/2013  . Neuropathy 12/16/2013    Past Surgical History:  Procedure Laterality Date  . APPENDECTOMY    . CHOLECYSTECTOMY    . CHOLECYSTECTOMY, LAPAROSCOPIC    . ESOPHAGOGASTRODUODENOSCOPY (EGD) WITH PROPOFOL N/A 03/29/2015   Procedure: ESOPHAGOGASTRODUODENOSCOPY (EGD) WITH PROPOFOL;  Surgeon: Josefine Class, MD;  Location[facility] ENDOSCOPY;  Service: Endoscopy;  Laterality: N/A;  . HERNIA REPAIR    . laproscopy    . TONSILLECTOMY    . uteral suspension       OB History    Gravida  1   Para      Term      Preterm      AB  1   Living  0     SAB      TAB  1   Ectopic      Multiple      Live Births              Family History  Problem Relation Age of Onset  . Heart disease Father   . Hearing loss Father   . COPD Father   . Depression Father   . Stroke Father   . Vision loss Father   . Varicose Veins Father   . Cancer Mother        lung    . Depression Sister   . Arthritis Sister   . Arthritis Brother   . Hernia Brother   . Anxiety disorder Brother   . Arthritis Brother   . Hernia Brother   . Anxiety disorder Brother   . Urolithiasis Neg Hx   . Kidney disease Neg Hx   . Kidney cancer Neg Hx   . Prostate cancer Neg Hx     Social History   Tobacco Use  . Smoking status: Former Smoker    Quit date: 11/17/1976    Years since quitting: 42.4  . Smokeless tobacco: Never Used  Substance Use Topics  . Alcohol use: Yes    Comment: occ  . Drug use: No    Home Medications Prior to Admission medications   Medication Sig Start Date End  Date Taking? Authorizing Provider  acetaminophen (TYLENOL) 650 MG CR tablet Take 650 mg by mouth every 8 (eight) hours as needed for pain.    [provider]  Acetylcysteine (NAC) 500 MG CAPS Take 500 mg by mouth 2 (two) times a day.    [provider]  albuterol (VENTOLIN HFA) 108 (90 Base) MCG/ACT inhaler Inhale 2 puffs into the lungs every 4 (four) hours as needed for wheezing or shortness of breath. 11/27/18   Crecencio Mc, MD  Amino Acids-Protein Hydrolys (FEEDING SUPPLEMENT, PRO-STAT 64,) LIQD Take 30 mLs by mouth daily. 04/12/18   Poulose, Bethel Born, NP  atorvastatin (LIPITOR) 20 MG tablet Take 1 tablet (20 mg total) by mouth at bedtime. 11/26/18   Crecencio Mc, MD  Biotin 10000 MCG TABS Take 10,000 mcg by mouth daily.     [provider]  Calcium Citrate (CITRACAL PO) Take 800 mg by mouth. Takes 2 (400mg ) tablets BID    [provider]  Cholecalciferol 25 MCG (1000 UT) tablet Take 1 tablet (1,000 Units total) by mouth daily. 12/26/17   Clapacs, Madie Reno, MD  Cyanocobalamin (VITAMIN B-12) 1000 MCG SUBL Place 1,000 mcg under the tongue.     [provider]  diclofenac sodium (VOLTAREN) 1 % GEL Apply small amount (2 grams) topically over knees or hands up to four times a day if needed for arthritis 11/26/18   Crecencio Mc, MD   doxycycline (VIBRAMYCIN) 100 MG capsule Take 1 capsule (100 mg total) by mouth 2 (two) times daily. 02/25/19   Coral Spikes, DO  famotidine (PEPCID) 20 MG tablet Take 1 tablet (20 mg total) by mouth 2 (two) times daily as needed for heartburn or indigestion. 11/26/18   Crecencio Mc, MD  ferrous sulfate 325 (65 FE) MG tablet Take 1 tablet (325 mg total) by mouth daily with breakfast. 11/26/18   Crecencio Mc, MD  fluconazole (DIFLUCAN) 150 MG tablet Take 1 tablet (150 mg total) by mouth daily. 04/06/19   Lannie Fields, PA-C  hydrOXYzine (ATARAX/VISTARIL) 10 MG tablet TAKE 1 TO 2 TABLETS BY MOUTH ONCE DAILY AS NEEDED 02/05/18   [provider]  Icosapent Ethyl (VASCEPA) 1 g CAPS TAKE 2 CAPSULES BY MOUTH TWICE DAILY (TO HELP WITH TRIGLYCERIDES) 11/26/18   Crecencio Mc, MD  Lactulose 20 GM/30ML SOLN 30 ml every 4 hours until constipation is relieved 11/08/18   Crecencio Mc, MD  levalbuterol (XOPENEX) 0.63 MG/3ML nebulizer solution Take 3 mLs (0.63 mg total) by nebulization every 4 (four) hours as needed for wheezing or shortness of breath. 11/26/18   Crecencio Mc, MD  levothyroxine (SYNTHROID) 50 MCG tablet Take 1 tablet (50 mcg total) by mouth daily at 6 (six) AM. Patient taking differently: Take 100 mcg by mouth daily at 6 (six) AM.  12/28/18   Nolberto Hanlon, MD  linaclotide Rolan Lipa) 290 MCG CAPS capsule Take 1 capsule (290 mcg total) by mouth daily before breakfast. 12/26/17   Clapacs, Madie Reno, MD  LORazepam (ATIVAN) 0.5 MG tablet TAKE 1 TABLET BY MOUTH AS NEEDED ONCE DAILY 02/06/18   [provider]  losartan (COZAAR) 50 MG tablet Take 1 tablet (50 mg total) by mouth daily. 11/26/18 02/24/19  Crecencio Mc, MD  Magnesium 500 MG TABS Take 500 mg by mouth every other day.    [provider]  PARoxetine (PAXIL) 20 MG tablet Take 20 mg by mouth daily.    [provider]  prednisoLONE acetate (PRED FORTE) 1 % ophthalmic suspension INSTILL 1 DROP INTO EACH  EYE TWICE DAILY 07/26/18   [provider]  pregabalin (LYRICA) 75 MG capsule Take 75 mg by mouth 2 (two) times daily.     [provider]  PREMARIN vaginal cream Place 1 Applicatorful vaginally 2 (two) times a week. 01/30/19   Rubie Maid, MD  promethazine (PHENERGAN) 12.5 MG tablet Take 12.5 mg by mouth every 6 (six) hours as needed for nausea or vomiting.    [provider]  pyridoxine (B-6) 100 MG tablet Take 1 tablet (100 mg total) by mouth 2 (two) times daily. 12/26/17   Clapacs, Madie Reno, MD  tiZANidine (ZANAFLEX) 2 MG tablet Take by mouth every 6 (six) hours as needed for muscle spasms.    [provider]  triamcinolone ointment (KENALOG) 0.5 % Apply 1 application topically 2 (two) times daily. 11/26/18   Crecencio Mc, MD  umeclidinium bromide (INCRUSE ELLIPTA) 62.5 MCG/INH AEPB Inhale 1 puff into the lungs daily. 11/26/18   Crecencio Mc, MD  XIIDRA 5 % SOLN INSTILL 1 DROP INTO EACH EYE TWICE DAILY 07/27/18   [provider]  traZODone (DESYREL) 50 MG tablet Take 1 tablet (50 mg total) by mouth at bedtime as needed. Pt takes 1/2 tablet qhs 11/26/18 02/25/19  Crecencio Mc, MD    Allergies    Advair diskus [fluticasone-salmeterol], Bee venom, Darvon [propoxyphene], Dicyclomine, Molds & smuts, Other, Oxycodone-acetaminophen, Peanut butter flavor, Peanuts [peanut oil], Penicillins, Sulfa antibiotics, Sulfacetamide sodium, Ultram [tramadol], Amikacin, Aspirin, Haloperidol, Hymenoptera venom preparations, Imipramine, Keflex [cephalexin], Lactose, Lactose intolerance (gi), Risperdal [risperidone], Sulfur, Adhesive [tape], Albuterol, Ciprofloxacin, and Prednisone  Review of Systems   Review of Systems  Constitutional: Positive for activity change.  Cardiovascular: Positive for chest pain.  Gastrointestinal: Positive for abdominal pain.  Allergic/Immunologic: Negative for immunocompromised state.  Hematological: Does not bruise/bleed easily.  All  other systems reviewed and are negative.   Physical Exam Updated Vital Signs BP (!) 147/77   Pulse 70   Temp 98.5 F (36.9 C) (Oral)   Resp 15   Ht 5\' 3"  (1.6 m)   Wt 65.8 kg   SpO2 98%   BMI 25.69 kg/m   Physical Exam Vitals and nursing note reviewed.  Constitutional:      Appearance: She is well-developed.  HENT:     Head: Normocephalic and atraumatic.  Cardiovascular:     Rate and Rhythm: Normal rate.     Heart sounds: Normal heart sounds.  Pulmonary:     Effort: Pulmonary effort is normal.  Abdominal:     General: Bowel sounds are normal.     Tenderness: There is abdominal tenderness.  Musculoskeletal:     Cervical back: Normal range of motion and neck supple.  Skin:    General: Skin is warm and dry.  Neurological:     Mental Status: She is alert and oriented to person, place, and time.    ED Results / Procedures / Treatments   Labs (all labs ordered are listed, but only abnormal results are displayed) Labs Reviewed  BASIC METABOLIC PANEL - Abnormal; Notable for the following components:      Result Value   Glucose, Bld 103 (*)    BUN 7 (*)    All other components within normal limits  CBC  TROPONIN I (HIGH SENSITIVITY)  TROPONIN I (HIGH SENSITIVITY)    EKG None  Radiology DG Chest 2 View  Result Date: 04/22/2019 CLINICAL DATA:  Chest pain. EXAM: CHEST - 2 VIEW 7:57 p.m. COMPARISON:  04/22/2019 at 6:04 p.m. and 12/04/2018 FINDINGS: The heart size and pulmonary vascularity are normal. Moderate chronic hiatal hernia. Diminished left pleural effusion. Slight atelectasis at the right base medially, unchanged. No acute bone abnormality. Old wedge deformity in the midthoracic spine, unchanged since November 2020. IMPRESSION: Diminished left effusion. Improved aeration at the left lung base. Persistent slight atelectasis at the right base. Electronically Signed   By: Lorriane Shire M.D.   On: 04/22/2019 20:09   DG Chest 2 View  Result Date: 04/22/2019 CLINICAL  DATA:  69 year old female with chest pain. EXAM: CHEST - 2 VIEW COMPARISON:  Chest radiograph dated 12/04/2018. FINDINGS: Shallow inspiration with bibasilar atelectasis. Developing infiltrate is less likely but not excluded. Clinical correlation is recommended. No large pleural effusion. No pneumothorax. Stable cardiac silhouette. Osteopenia with degenerative changes of the spine. No acute osseous pathology. IMPRESSION: Shallow inspiration with bibasilar atelectasis versus less likely infiltrate. Electronically Signed   By: Anner Crete M.D.   On: 04/22/2019 18:47    Procedures Procedures (including critical care time)  Medications Ordered in ED Medications  sodium chloride flush (NS) 0.9 % injection 3 mL (has no administration in time range)  ondansetron (ZOFRAN-ODT) disintegrating tablet 4 mg (4 mg Oral Given 04/23/19 0546)  ipratropium (ATROVENT HFA) inhaler 2 puff (2 puffs Inhalation Given 04/23/19 0547)  dexamethasone (DECADRON) injection 10 mg (10 mg Intramuscular Given 04/23/19 0547)    ED Course  I have reviewed the triage vital signs and the nursing notes.  Pertinent labs & imaging results that were available during my care of the patient were reviewed by me and considered in my medical decision making (see chart for details).    MDM Rules/Calculators/A&P HEAR Score: 21                    69 year old female comes in with chief complaint of chest pain.  She is having left-sided chest discomfort that is described as constant pain into 6 days.  The pain has no specific evoking, aggravating or relieving factors.  She suspects that her symptoms could be because of her hiatal hernia.  On chest x-ray there is no abnormalities, EKG is also reassuring.  Delta troponin ordered with a hear score of 4 and they are normal.  I doubt that that her symptoms are because of ACS given that she has been having constant pain for 6 days now.  Hernia is possibly causing her issues, however with x-rays that  appear fine and abdominal exam that does not reveal any peritonitis, doubt that there is an acute emergent issues.  No CT scan indicated at this time.  Blood work is also reassuring -CBC and metabolic profile is normal.    Final Clinical Impression(s) / ED Diagnoses Final diagnoses:  Hiatal hernia  Nonspecific chest pain  Sinus congestion  Exposure to mold    Rx / DC Orders ED Discharge Orders    None       Varney Biles, MD 04/23/19 0745

## 2019-04-23 NOTE — ED Notes (Signed)
Patient verbalizes understanding of discharge instructions. Opportunity for questioning and answers were provided. Armband removed by staff, pt discharged from ED.  

## 2019-04-23 NOTE — Discharge Instructions (Signed)
Please follow-up with [facility] for hernia evaluation and your cardiologist for chest pain. Call to set up an appointment.  We have also contacted our social worker to get in touch with you to help you with your living situation.  Please return to the ER if you have worsening chest pain, shortness of breath, pain radiating to your jaw, shoulder, or back, sweats or fainting. Otherwise see the Cardiologist or your primary care doctor as requested.

## 2019-04-24 ENCOUNTER — Other Ambulatory Visit: Payer: Self-pay | Admitting: Internal Medicine

## 2019-04-24 DIAGNOSIS — K219 Gastro-esophageal reflux disease without esophagitis: Secondary | ICD-10-CM

## 2019-04-25 ENCOUNTER — Telehealth: Payer: Self-pay | Admitting: General Practice

## 2019-04-25 NOTE — Telephone Encounter (Signed)
Patient called today about not receiving her adjustable frame and bed. She stated it was sent to the wrong address. Patient stated she has moved, unable to get the new address at the time of call. Patient was called on 04/25/2019 at 10:21am and new address was given, address updated in chart. At the time of call to get new address patient did ask if Dr. Derrel Nip was going to expedite order. Patient was advised that Dr. Derrel Nip was out of the office today and at this time office was unsure. Patient was also advised to establish with a new provider in her area.

## 2019-04-28 NOTE — Progress Notes (Deleted)
Cardiology Office Note  Date:  04/28/2019   ID:  Angela [name], DOB [birth year], MRN [id number]  PCP:  Patient, No Pcp Per   No chief complaint on file.   HPI:  Patient has a past medical history of COPD Leg edema Bipolar;/personality disorder/Depression/ PTSD seen by psychiatry Former smoker, stopped in her 62s large hiatal hernia Scoliosis, On CT: Minimal aortic athero in the descending aorta No significant coronary calcification, no significant carotid calcification Who presents for f/u of her  chest pain  Seen in the emergency room April 22, 2019 for chest pain Also reported having shortness of breath from prior mold exposure Cardiac enzymes negative Felt her symptoms were from hiatal hernia  Seen in the emergency room April 11, 2019 for shortness of breath at [facility] Again was concerned about mold in her apartment causing her shortness of breath Note indicating it was the second visit in 2 days  Numerous other trips to the emergency room for various reasons    Last visit [place] 05/31/2018   In follow-up today she reports fullness in her chest, constipation issues Lost appetite, which she attributes to her large hiatal hernia seen on CT scan Chronic chest pain, atypical in nature  Seen in the ER 07/11/2018, muiltiple issues, including chest pain CT angio chest: Large hiatal hernia unchanged. No significant atherosclerosis  Seen in the emergency room February 2020 for shortness of breath And left-sided chest pain Work-up negative  Seen by Dr. Philis Kendall Would likely need to undergo laparoscopic hiatal hernia repair and fundoplication, possible gastropexy.   Prior to procedure was referred to GI in [facility] for EGD colonoscopy and management of her chronic constipation  EKG personally reviewed by myself on todays visit Shows NSR rate 80 bpm, no significant ST or T wave changes  Prior CV studies:   The following studies were reviewed today: Echo 2017 -  Left ventricle: The cavity size was normal. Wall thickness was   normal. Systolic function was normal. The estimated ejection   fraction was in the range of 60% to 65%. Wall motion was normal;   there were no regional wall motion abnormalities. Doppler   parameters are consistent with abnormal left ventricular   relaxation (grade 1 diastolic dysfunction). - Mitral valve: There was mild regurgitation. - Right atrium: The right atrium appears to be compressed by an   outside structure likely the known hiatal hernia.   PMH:   has a past medical history of Allergic rhinitis, Anemia, Blurred vision, Depression, Diverticulosis, Endometriosis, Falls, GERD (gastroesophageal reflux disease), Hernia, hiatal, Hypothyroidism, IBS (irritable bowel syndrome), Malignant neoplasm of skin, Migraine, Neuropathy, PTSD (post-traumatic stress disorder), and Thyroid disease.  PSH:    Past Surgical History:  Procedure Laterality Date  . APPENDECTOMY    . CHOLECYSTECTOMY    . CHOLECYSTECTOMY, LAPAROSCOPIC    . ESOPHAGOGASTRODUODENOSCOPY (EGD) WITH PROPOFOL N/A 03/29/2015   Procedure: ESOPHAGOGASTRODUODENOSCOPY (EGD) WITH PROPOFOL;  Surgeon: Josefine Class, MD;  Location: [facility] ENDOSCOPY;  Service: Endoscopy;  Laterality: N/A;  . HERNIA REPAIR    . laproscopy    . TONSILLECTOMY    . uteral suspension      Current Outpatient Medications  Medication Sig Dispense Refill  . acetaminophen (TYLENOL) 650 MG CR tablet Take 650 mg by mouth every 8 (eight) hours as needed for pain.    . Acetylcysteine (NAC) 500 MG CAPS Take 500 mg by mouth 2 (two) times a day.    . albuterol (VENTOLIN HFA) 108 (90 Base) MCG/ACT  inhaler Inhale 2 puffs into the lungs every 4 (four) hours as needed for wheezing or shortness of breath. 18 g 3  . Amino Acids-Protein Hydrolys (FEEDING SUPPLEMENT, PRO-STAT 64,) LIQD Take 30 mLs by mouth daily. 887 mL 0  . atorvastatin (LIPITOR) 20 MG tablet Take 1 tablet (20 mg total) by mouth at  bedtime. 90 tablet 0  . Biotin 10000 MCG TABS Take 10,000 mcg by mouth daily.     . Calcium Citrate (CITRACAL PO) Take 800 mg by mouth. Takes 2 (400mg ) tablets BID    . Cholecalciferol 25 MCG (1000 UT) tablet Take 1 tablet (1,000 Units total) by mouth daily. 30 tablet 1  . Cyanocobalamin (VITAMIN B-12) 1000 MCG SUBL Place 1,000 mcg under the tongue.     . diclofenac sodium (VOLTAREN) 1 % GEL Apply small amount (2 grams) topically over knees or hands up to four times a day if needed for arthritis 100 g 0  . doxycycline (VIBRAMYCIN) 100 MG capsule Take 1 capsule (100 mg total) by mouth 2 (two) times daily. 14 capsule 0  . famotidine (PEPCID) 20 MG tablet Take 1 tablet (20 mg total) by mouth 2 (two) times daily as needed for heartburn or indigestion. 180 tablet 0  . ferrous sulfate 325 (65 FE) MG tablet Take 1 tablet (325 mg total) by mouth daily with breakfast. 90 tablet 0  . fluconazole (DIFLUCAN) 150 MG tablet Take 1 tablet (150 mg total) by mouth daily. 1 tablet 0  . hydrOXYzine (ATARAX/VISTARIL) 10 MG tablet TAKE 1 TO 2 TABLETS BY MOUTH ONCE DAILY AS NEEDED    . Icosapent Ethyl (VASCEPA) 1 g CAPS TAKE 2 CAPSULES BY MOUTH TWICE DAILY (TO HELP WITH TRIGLYCERIDES) 180 capsule 0  . Lactulose 20 GM/30ML SOLN 30 ml every 4 hours until constipation is relieved 236 mL 3  . levalbuterol (XOPENEX) 0.63 MG/3ML nebulizer solution Take 3 mLs (0.63 mg total) by nebulization every 4 (four) hours as needed for wheezing or shortness of breath. 300 mL 2  . levothyroxine (SYNTHROID) 50 MCG tablet Take 1 tablet (50 mcg total) by mouth daily at 6 (six) AM. (Patient taking differently: Take 100 mcg by mouth daily at 6 (six) AM. ) 30 tablet 0  . linaclotide (LINZESS) 290 MCG CAPS capsule Take 1 capsule (290 mcg total) by mouth daily before breakfast. 30 capsule 0  . LORazepam (ATIVAN) 0.5 MG tablet TAKE 1 TABLET BY MOUTH AS NEEDED ONCE DAILY    . losartan (COZAAR) 50 MG tablet Take 1 tablet (50 mg total) by mouth daily.  90 tablet 3  . Magnesium 500 MG TABS Take 500 mg by mouth every other day.    Marland Kitchen PARoxetine (PAXIL) 20 MG tablet Take 20 mg by mouth daily.    . prednisoLONE acetate (PRED FORTE) 1 % ophthalmic suspension INSTILL 1 DROP INTO EACH EYE TWICE DAILY    . pregabalin (LYRICA) 75 MG capsule Take 75 mg by mouth 2 (two) times daily.     Marland Kitchen PREMARIN vaginal cream Place 1 Applicatorful vaginally 2 (two) times a week. 60 g 2  . promethazine (PHENERGAN) 12.5 MG tablet Take 12.5 mg by mouth every 6 (six) hours as needed for nausea or vomiting.    . pyridoxine (B-6) 100 MG tablet Take 1 tablet (100 mg total) by mouth 2 (two) times daily. 60 tablet 0  . tiZANidine (ZANAFLEX) 2 MG tablet Take by mouth every 6 (six) hours as needed for muscle spasms.    Marland Kitchen triamcinolone  ointment (KENALOG) 0.5 % Apply 1 application topically 2 (two) times daily. 30 g 2  . umeclidinium bromide (INCRUSE ELLIPTA) 62.5 MCG/INH AEPB Inhale 1 puff into the lungs daily. 60 each 5  . XIIDRA 5 % SOLN INSTILL 1 DROP INTO EACH EYE TWICE DAILY     No current facility-administered medications for this visit.     Allergies:   Advair diskus [fluticasone-salmeterol], Bee venom, Darvon [propoxyphene], Dicyclomine, Molds & smuts, Other, Oxycodone-acetaminophen, Peanut butter flavor, Peanuts [peanut oil], Penicillins, Sulfa antibiotics, Sulfacetamide sodium, Ultram [tramadol], Amikacin, Aspirin, Haloperidol, Hymenoptera venom preparations, Imipramine, Keflex [cephalexin], Lactose, Lactose intolerance (gi), Risperdal [risperidone], Sulfur, Adhesive [tape], Albuterol, Ciprofloxacin, and Prednisone   Social History:  The patient  reports that she quit smoking about 42 years ago. She has never used smokeless tobacco. She reports current alcohol use. She reports that she does not use drugs.   Family History:   family history includes Anxiety disorder in her brother and brother; Arthritis in her brother, brother, and sister; COPD in her father; Cancer in her  mother; Depression in her father and sister; Hearing loss in her father; Heart disease in her father; Hernia in her brother and brother; Stroke in her father; Varicose Veins in her father; Vision loss in her father.    Review of Systems: Review of Systems  Constitutional: Negative.   HENT: Negative.   Respiratory: Negative.   Cardiovascular: Positive for chest pain.  Gastrointestinal: Positive for constipation.  Musculoskeletal: Negative.   Neurological: Negative.   Psychiatric/Behavioral: Negative.   All other systems reviewed and are negative.   PHYSICAL EXAM: VS:  There were no vitals taken for this visit. , BMI There is no height or weight on file to calculate BMI. GEN: Well nourished, well developed, in no acute distress HEENT: normal Neck: no JVD, carotid bruits, or masses Cardiac: RRR; no murmurs, rubs, or gallops,no edema  Respiratory:  clear to auscultation bilaterally, normal work of breathing GI: soft, nontender, nondistended, + BS MS: no deformity or atrophy Skin: warm and dry, no rash Neuro:  Strength and sensation are intact Psych: euthymic mood, full affect   Recent Labs: 06/08/2018: B Natriuretic Peptide 55.0 10/22/2018: Magnesium 2.3 01/01/2019: ALT 33; TSH 7.098 04/22/2019: BUN 7; Creatinine, Ser 0.64; Hemoglobin 12.7; Platelets 318; Potassium 3.8; Sodium 136    Lipid Panel Lab Results  Component Value Date   CHOL 199 12/26/2018   HDL 62 12/26/2018   LDLCALC 75 12/26/2018   TRIG 312 (H) 12/26/2018      Wt Readings from Last 3 Encounters:  04/22/19 145 lb (65.8 kg)  04/22/19 143 lb 4.8 oz (65 kg)  04/06/19 145 lb (65.8 kg)       ASSESSMENT AND PLAN:  Problem List Items Addressed This Visit    None     Chest pain Atypical, no further ischemic work-up needed Pressure suspected from hiatal hernia CT reviewed, large hernia, Minimal if any aortic athero  Hiatal hernia Will call GI in [facility], see if we can move up EGD, colo Also  needs management of chronic constipation Then back to [facility] for surgery for consideration of surgery  HTN Blood pressure is well controlled on today's visit. No changes made to the medications.  Hyperlipidemia On statin  Disposition:   F/U  12 months   Total encounter time more than 25 minutes  Greater than 50% was spent in counseling and coordination of care with the patient    Signed, Esmond Plants, M.D., Ph.D. [place]  [place], [place]

## 2019-04-28 NOTE — Telephone Encounter (Signed)
No answer/Voicemail box is full.  

## 2019-04-29 ENCOUNTER — Telehealth: Payer: Self-pay | Admitting: Cardiovascular Disease

## 2019-04-29 ENCOUNTER — Encounter: Payer: Self-pay | Admitting: Internal Medicine

## 2019-04-29 ENCOUNTER — Other Ambulatory Visit: Payer: Self-pay

## 2019-04-29 ENCOUNTER — Telehealth: Payer: Self-pay

## 2019-04-29 ENCOUNTER — Ambulatory Visit: Payer: Medicare Other | Admitting: Cardiovascular Disease

## 2019-04-29 ENCOUNTER — Ambulatory Visit: Payer: Self-pay | Admitting: Licensed Clinical Social Worker

## 2019-04-29 ENCOUNTER — Ambulatory Visit (INDEPENDENT_AMBULATORY_CARE_PROVIDER_SITE_OTHER): Payer: Medicare Other | Admitting: Internal Medicine

## 2019-04-29 VITALS — BP 150/80 | HR 60 | Temp 97.5°F | Ht 66.0 in | Wt 146.0 lb

## 2019-04-29 DIAGNOSIS — R601 Generalized edema: Secondary | ICD-10-CM

## 2019-04-29 DIAGNOSIS — E034 Atrophy of thyroid (acquired): Secondary | ICD-10-CM | POA: Insufficient documentation

## 2019-04-29 DIAGNOSIS — F3341 Major depressive disorder, recurrent, in partial remission: Secondary | ICD-10-CM

## 2019-04-29 DIAGNOSIS — E559 Vitamin D deficiency, unspecified: Secondary | ICD-10-CM | POA: Diagnosis not present

## 2019-04-29 DIAGNOSIS — D509 Iron deficiency anemia, unspecified: Secondary | ICD-10-CM

## 2019-04-29 DIAGNOSIS — I10 Essential (primary) hypertension: Secondary | ICD-10-CM

## 2019-04-29 DIAGNOSIS — E781 Pure hyperglyceridemia: Secondary | ICD-10-CM | POA: Diagnosis not present

## 2019-04-29 DIAGNOSIS — K581 Irritable bowel syndrome with constipation: Secondary | ICD-10-CM

## 2019-04-29 DIAGNOSIS — K219 Gastro-esophageal reflux disease without esophagitis: Secondary | ICD-10-CM | POA: Diagnosis not present

## 2019-04-29 DIAGNOSIS — M8580 Other specified disorders of bone density and structure, unspecified site: Secondary | ICD-10-CM | POA: Insufficient documentation

## 2019-04-29 DIAGNOSIS — J410 Simple chronic bronchitis: Secondary | ICD-10-CM

## 2019-04-29 DIAGNOSIS — H04123 Dry eye syndrome of bilateral lacrimal glands: Secondary | ICD-10-CM

## 2019-04-29 DIAGNOSIS — E441 Mild protein-calorie malnutrition: Secondary | ICD-10-CM

## 2019-04-29 MED ORDER — ATORVASTATIN CALCIUM 20 MG PO TABS: 20.0000 mg | ORAL_TABLET | Freq: Every day | ORAL | 0 refills | Status: DC

## 2019-04-29 MED ORDER — VITAMIN D2 50 MCG (2000 UT) PO TABS: 1.0000 | ORAL_TABLET | Freq: Every day | ORAL | 0 refills | Status: DC

## 2019-04-29 MED ORDER — HYDROXYZINE HCL 10 MG PO TABS: 10.0000 mg | ORAL_TABLET | Freq: Three times a day (TID) | ORAL | 0 refills | Status: DC | PRN

## 2019-04-29 MED ORDER — LACTULOSE 20 GM/30ML PO SOLN: ORAL | 0 refills | Status: DC

## 2019-04-29 MED ORDER — LEVALBUTEROL HCL 0.63 MG/3ML IN NEBU: 0.6300 mg | INHALATION_SOLUTION | RESPIRATORY_TRACT | 2 refills | Status: DC | PRN

## 2019-04-29 MED ORDER — LOSARTAN POTASSIUM 50 MG PO TABS: 100.0000 mg | ORAL_TABLET | Freq: Every day | ORAL | 0 refills | Status: DC

## 2019-04-29 MED ORDER — FERROUS SULFATE 325 (65 FE) MG PO TABS: 325.0000 mg | ORAL_TABLET | Freq: Every day | ORAL | 0 refills | Status: DC

## 2019-04-29 NOTE — Telephone Encounter (Signed)
Patient wanted me to message you and let you know that she also needs a referral to [facility] for Dr Alvira Monday. She said she seen this doctor in the past and has issues with sleep and sitting up.   CM

## 2019-04-29 NOTE — Telephone Encounter (Signed)
Patient came in to office today 20 minutes past appointment time. Patient was advised Dr. Rockey Situ would not be able to see her today and patient became emotional. Patient began to talk about her current living situation and recent hospital visit regarding mold poisoning. Patient is very distraught about her finances and was wanting to get referrals. Patient was offered to reschedule with assistant later in the week however she refused.  Patient would like a phone call after 3 pm today

## 2019-04-29 NOTE — Telephone Encounter (Signed)
Pt called back. Pt is aware that she will need to get a new PCP in order to get a new order and office notes. Pt gave a verbal understanding.

## 2019-04-29 NOTE — Telephone Encounter (Signed)
Patient missed a call from the office.

## 2019-04-29 NOTE — Patient Instructions (Signed)
You will receive a call from Chronic Care Management as soon as possible. A referral has been placed.

## 2019-04-29 NOTE — Chronic Care Management (AMB) (Signed)
  Care Management   Follow Up Note   04/29/2019 Name: Adrianny Slisz MRN: [id number] DOB: May 27, 1950  Referred by: Glean Hess, MD Reason for referral : No chief complaint on file.   Rushelle Gray is a 69 y.o. year old female who is a primary care patient of Glean Hess, MD. The care management team was consulted for assistance with care management and care coordination needs.    Review of patient status, including review of consultants reports, relevant laboratory and other test results, and collaboration with appropriate care team members and the patient's provider was performed as part of comprehensive patient evaluation and provision of chronic care management services.    Referral to CCM team accepted. LCSW will schedule CCM initial outreach. Patient is requesting help with living expenses, medications, food, housing. C3 Referral completed as well.  Eula Fried, BSW, MSW, [email or web address] Phone: [phone]

## 2019-04-29 NOTE — Telephone Encounter (Signed)
Spoke with [employer] in regards to the order for a hospital bed. [employer] stated that they received the order back last year in November and they had tried reaching out to the pt many of times. They were never able to contact the pt so they closed the order. He stated that since it has been so long the pt would need a new order and up to date face to face office notes. I let him know that pt is no longer a pt at our office and he stated that she would need to find a new PCP and get new orders sent to them.   Attempted to call pt to let her know of the information above. No answer, no voicemail.

## 2019-04-29 NOTE — Telephone Encounter (Signed)
No answer/Voicemail box is full. Closing encounter since I am not able to reach her.

## 2019-04-29 NOTE — Telephone Encounter (Signed)
No answer/Voicemail box is full.  

## 2019-04-29 NOTE — Progress Notes (Signed)
Date:  04/29/2019   Name:  Angela [name]   DOB:  06-15-1950   MRN:  [id number]   Chief Complaint: Establish Care (havent ate X1 week maybe one meal a day,mold changed her personality, thinking differently, lost appetite, issues with mold in apartment,been staying in hotel, been to ER, need [facility] to [facility] )  Hypertension This is a chronic problem. The current episode started more than 1 year ago. The problem is unchanged. The problem is uncontrolled. Associated symptoms include peripheral edema and shortness of breath. Pertinent negatives include no blurred vision, chest pain, headaches or palpitations. Past treatments include angiotensin blockers (losartan was doubled by UC to 100 mg). The current treatment provides mild improvement. Identifiable causes of hypertension include a thyroid problem.  Thyroid Problem Presents for follow-up visit. Symptoms include anxiety, constipation, fatigue and leg swelling. Patient reports no diaphoresis, palpitations or weight loss. The symptoms have been stable. Her past medical history is significant for hyperlipidemia.  Hyperlipidemia This is a chronic problem. Condition status: LDL was good but Trig too high. Recent lipid tests were reviewed and are variable. Associated symptoms include shortness of breath. Pertinent negatives include no chest pain. Current antihyperlipidemic treatment includes statins (Vasecpa). The current treatment provides moderate improvement of lipids.  Gastroesophageal Reflux She complains of abdominal pain, early satiety, heartburn, nausea and wheezing. She reports no chest pain. This is a chronic problem. Associated symptoms include fatigue. Pertinent negatives include no weight loss. Risk factors include hiatal hernia (been referred to General Surg and GI at [facility] for evaluation).  Anemia Presents for follow-up (long standing anemia per pt -) visit. Symptoms include abdominal pain and leg swelling. There has been no  fever, palpitations or weight loss. Signs of blood loss that are not present include hematochezia.  Vitamin D def - she is on supplements and has not had levels done recently.  She reports hx of osteopenia.    Malnutrition - She has been living in a hotel that provides 2 meals but is only able to gather herself together to go for one meal.  She wants me to prescribe lactose free Boost so she can use her trust to have it paid for.  I advised her that we need some labs to justify the supplement.  She needs to get herself to both meals as well.  Dry eye syndrome - She has [facility] Fri[facility] on her med list.  She will need to see Ophthalmology to get this refilled.  She reports being dismissed from [facility] for excessive no shows.  IBS with constipation - she is on chronic Lactulose and Linzess to control her symptoms.  Lab Results  Component Value Date   CREATININE 0.64 04/22/2019   BUN 7 (L) 04/22/2019   NA 136 04/22/2019   K 3.8 04/22/2019   CL 101 04/22/2019   CO2 24 04/22/2019   Lab Results  Component Value Date   CHOL 199 12/26/2018   HDL 62 12/26/2018   LDLCALC 75 12/26/2018   TRIG 312 (H) 12/26/2018   CHOLHDL 3.2 12/26/2018   Lab Results  Component Value Date   TSH 7.098 (H) 01/01/2019   Lab Results  Component Value Date   HGBA1C 6.0 (H) 12/26/2018   Lab Results  Component Value Date   WBC 10.2 04/22/2019   HGB 12.7 04/22/2019   HCT 38.7 04/22/2019   MCV 93.0 04/22/2019   PLT 318 04/22/2019   Lab Results  Component Value Date   ALT 33 01/01/2019  AST 38 01/01/2019   ALKPHOS 118 01/01/2019   BILITOT 1.0 01/01/2019     Review of Systems  Constitutional: Positive for fatigue. Negative for chills, diaphoresis, fever and weight loss.  HENT: Positive for trouble swallowing.   Eyes: Negative for blurred vision.  Respiratory: Positive for shortness of breath and wheezing.   Cardiovascular: Positive for leg swelling. Negative for chest pain and palpitations.    Gastrointestinal: Positive for abdominal pain, constipation, heartburn and nausea. Negative for hematochezia.  Genitourinary: Negative for dysuria.  Musculoskeletal: Positive for gait problem (uses a rollator).  Neurological: Negative for headaches.  Hematological: Negative for adenopathy.  Psychiatric/Behavioral: Positive for dysphoric mood and sleep disturbance. The patient is nervous/anxious.     Patient Active Problem List   Diagnosis Date Noted  . Labile mood   . Altered mental status, unspecified 12/25/2018  . Confusion state 12/25/2018  . Abnormal TSH 12/25/2018  . Acquired claw toe of both feet 10/23/2018  . Dermatitis 10/22/2018  . Cough productive of yellow sputum 08/08/2018  . Malnutrition of mild degree ([place]) 08/08/2018  . Mixed stress and urge urinary incontinence 08/08/2018  . Magnesium deficiency 08/08/2018  . Aortic atherosclerosis ([place]) 05/31/2018  . Chest pain of uncertain etiology [id number]  . Achilles tendon disorder 04/25/2018  . Insomnia 04/25/2018  . Diastolic dysfunction [id number]  . Mitral regurgitation 03/29/2018  . Bronchospasm 03/15/2018  . Axis II diagnosis deferred 02/20/2018  . Preventative health care 02/08/2018  . Polypharmacy 02/01/2018  . Personality disorder ([place]) 12/25/2017  . Noncompliance 12/25/2017  . Medication monitoring encounter 12/07/2017  . Family history of high cholesterol 12/07/2017  . Arthritis of hand, degenerative 12/07/2017  . Bipolar I disorder, most recent episode mixed, severe with psychotic features ([place]) 09/18/2017  . Agitation 09/18/2017  . Depressed mood 07/11/2017  . Hallux valgus, acquired 07/05/2017  . Hammer toes of both feet 07/05/2017  . Unstable ankle 07/05/2017  . Chronic bilateral low back pain without sciatica 06/09/2017  . Tinea unguium 05/08/2017  . Conductive hearing loss of right ear with unrestricted hearing of left ear 04/03/2017  . Adjustment disorder with depressed mood 01/18/2017  .  Migraine 01/18/2017  . Irritable bowel syndrome with constipation 11/17/2016  . Bilateral carpal tunnel syndrome 11/17/2016  . Plantar fasciitis, bilateral 11/17/2016  . Cervical myofascial pain syndrome 11/17/2016  . Chronic hyponatremia 11/17/2016  . PTSD (post-traumatic stress disorder) 11/17/2016  . Hiatal hernia 11/17/2016  . Vitamin D deficiency 11/17/2016  . Vitamin B12 deficiency 11/17/2016  . SIADH (syndrome of inappropriate ADH production) ([place]) 09/08/2016  . Cervical spondylosis with myelopathy 05/24/2016  . MCI (mild cognitive impairment) with memory loss 03/30/2016  . Gait abnormality 03/30/2016  . Chronic bipolar affective disorder ([place]) 01/24/2016  . Recurrent major depressive disorder, in partial remission ([place]) 01/24/2016  . Anemia, iron deficiency 06/24/2015  . Benign essential HTN 06/24/2015  . History of falling 05/22/2015  . History of TIA (transient ischemic attack) 04/20/2015  . Hypothyroidism 04/20/2015  . GERD (gastroesophageal reflux disease) 04/20/2015  . Cervical disc disorder at C5-C6 level with radiculopathy 03/25/2015  . Chronic neck pain 03/25/2015  . Pelvic pain in female 10/30/2014  . History of skin cancer 06/30/2014  . Chronic sinusitis 06/30/2014  . Chronic headaches 12/16/2013  . Neuropathy 12/16/2013    Allergies  Allergen Reactions  . Advair Diskus [Fluticasone-Salmeterol] Shortness Of Breath  . Bee Venom Hives  . Darvon [Propoxyphene] Anaphylaxis and Hives    Hives/throat swelling   . Dicyclomine Itching  .  Molds & Smuts Shortness Of Breath and Itching    Eyes-itchy  . Other Hives    Spider bites cause hives  . Oxycodone-Acetaminophen Nausea And Vomiting and Other (See Comments)    hallucinations   . Peanut Butter Flavor Anaphylaxis  . Peanuts [Peanut Oil] Anaphylaxis and Hives  . Penicillins Hives    Has patient had a PCN reaction causing immediate rash, facial/tongue/throat swelling, SOB or lightheadedness with hypotension:  Unknown Has patient had a PCN reaction causing severe rash involving mucus membranes or skin necrosis: Unknown Has patient had a PCN reaction that required hospitalization: Unknown Has patient had a PCN reaction occurring within the last 10 years: Unknown If all of the above answers are "NO", then may proceed with Cephalosporin use.  . Sulfa Antibiotics Anaphylaxis, Hives and Itching  . Sulfacetamide Sodium Anaphylaxis, Hives and Itching  . Ultram [Tramadol] Hives  . Amikacin Nausea And Vomiting  . Aspirin Hives  . Haloperidol Nausea And Vomiting  . Hymenoptera Venom Preparations Hives    Reaction to spider bites  . Imipramine Hives    Reaction to Tofranil  . Keflex [Cephalexin] Hives  . Lactose   . Lactose Intolerance (Gi)   . Risperdal [Risperidone]     Serotonin syndrome  . Sulfur Hives  . Adhesive [Tape] Hives and Rash  . Albuterol Palpitations    Tolerates levalbuterol  . Ciprofloxacin Other (See Comments)    tendonopathy  . Prednisone Nausea And Vomiting, Anxiety and Other (See Comments)    Crazy mood swings, strange thoughts. Tolerates depomedrol     Past Surgical History:  Procedure Laterality Date  . APPENDECTOMY    . CHOLECYSTECTOMY    . CHOLECYSTECTOMY, LAPAROSCOPIC    . ESOPHAGOGASTRODUODENOSCOPY (EGD) WITH PROPOFOL N/A 03/29/2015   Procedure: ESOPHAGOGASTRODUODENOSCOPY (EGD) WITH PROPOFOL;  Surgeon: Josefine Class, MD;  Location: [facility] ENDOSCOPY;  Service: Endoscopy;  Laterality: N/A;  . HERNIA REPAIR    . laproscopy    . TONSILLECTOMY    . uteral suspension      Social History   Tobacco Use  . Smoking status: Former Smoker    Quit date: 11/17/1976    Years since quitting: 42.4  . Smokeless tobacco: Never Used  Substance Use Topics  . Alcohol use: Yes    Comment: occ  . Drug use: No     Medication list has been reviewed and updated.  Current Meds  Medication Sig  . acetaminophen (TYLENOL) 650 MG CR tablet Take 650 mg by mouth every 8 (eight)  hours as needed for pain.  Marland Kitchen albuterol (VENTOLIN HFA) 108 (90 Base) MCG/ACT inhaler Inhale 2 puffs into the lungs every 4 (four) hours as needed for wheezing or shortness of breath.  . famotidine (PEPCID) 20 MG tablet Take 1 tablet (20 mg total) by mouth 2 (two) times daily as needed for heartburn or indigestion.  . hydrOXYzine (ATARAX/VISTARIL) 10 MG tablet Take 1 tablet (10 mg total) by mouth every 8 (eight) hours as needed.  . levalbuterol (XOPENEX) 0.63 MG/3ML nebulizer solution Take 3 mLs (0.63 mg total) by nebulization every 4 (four) hours as needed for wheezing or shortness of breath.  . levothyroxine (SYNTHROID) 50 MCG tablet Take 1 tablet (50 mcg total) by mouth daily at 6 (six) AM. (Patient taking differently: Take 100 mcg by mouth daily at 6 (six) AM. )  . linaclotide (LINZESS) 290 MCG CAPS capsule Take 1 capsule (290 mcg total) by mouth daily before breakfast.  . LORazepam (ATIVAN) 0.5 MG tablet  TAKE 1 TABLET BY MOUTH AS NEEDED ONCE DAILY  . losartan (COZAAR) 50 MG tablet Take 2 tablets (100 mg total) by mouth daily.  Marland Kitchen PARoxetine (PAXIL) 20 MG tablet Take 20 mg by mouth daily.  . pregabalin (LYRICA) 75 MG capsule Take 75 mg by mouth 2 (two) times daily.   Marland Kitchen PREMARIN vaginal cream Place 1 Applicatorful vaginally 2 (two) times a week.  . promethazine (PHENERGAN) 12.5 MG tablet Take 12.5 mg by mouth every 6 (six) hours as needed for nausea or vomiting.  Marland Kitchen tiZANidine (ZANAFLEX) 2 MG tablet Take by mouth every 6 (six) hours as needed for muscle spasms.  Marland Kitchen umeclidinium bromide (INCRUSE ELLIPTA) 62.5 MCG/INH AEPB Inhale 1 puff into the lungs daily.  Marland Kitchen XIIDRA 5 % SOLN INSTILL 1 DROP INTO EACH EYE TWICE DAILY  . [DISCONTINUED] hydrOXYzine (ATARAX/VISTARIL) 10 MG tablet TAKE 1 TO 2 TABLETS BY MOUTH ONCE DAILY AS NEEDED  . [DISCONTINUED] levalbuterol (XOPENEX) 0.63 MG/3ML nebulizer solution Take 3 mLs (0.63 mg total) by nebulization every 4 (four) hours as needed for wheezing or shortness of  breath.  . [DISCONTINUED] losartan (COZAAR) 50 MG tablet Take 1 tablet (50 mg total) by mouth daily.    PHQ 2/9 Scores 04/29/2019 07/25/2018 05/14/2018 04/12/2018  PHQ - 2 Score 6 1 6 4   PHQ- 9 Score 24 - 17 9   GAD 7 : Generalized Anxiety Score 04/29/2019  Nervous, Anxious, on Edge 3  Control/stop worrying 3  Worry too much - different things 3  Trouble relaxing 3  Restless 3  Easily annoyed or irritable 3  Afraid - awful might happen 3  Total GAD 7 Score 21  Anxiety Difficulty Very difficult     BP Readings from Last 3 Encounters:  04/29/19 (!) 150/80  04/23/19 (!) 147/77  04/22/19 (!) 147/66    Physical Exam Vitals and nursing note reviewed.  Constitutional:      General: She is not in acute distress.    Appearance: Normal appearance. She is well-developed.  HENT:     Head: Normocephalic and atraumatic.  Cardiovascular:     Rate and Rhythm: Normal rate and regular rhythm.     Pulses: Normal pulses.     Heart sounds: No murmur.     Comments: Mild pitting of the low back  Pulmonary:     Effort: Pulmonary effort is normal. No respiratory distress.     Breath sounds: No wheezing or rhonchi.  Abdominal:     General: Bowel sounds are normal.     Palpations: Abdomen is soft. There is no mass.     Tenderness: There is no abdominal tenderness.  Musculoskeletal:     Cervical back: Normal range of motion.     Right lower leg: 1+ Edema present.     Left lower leg: 1+ Edema present.  Lymphadenopathy:     Cervical: No cervical adenopathy.  Skin:    General: Skin is warm and dry.     Findings: No rash.  Neurological:     Mental Status: She is alert and oriented to person, place, and time.  Psychiatric:        Attention and Perception: Attention normal.        Mood and Affect: Mood is anxious.        Speech: Speech is tangential.        Behavior: Behavior normal. Behavior is cooperative.        Thought Content: Thought content is paranoid.     Wt  Readings from Last 3  Encounters:  04/29/19 146 lb (66.2 kg)  04/22/19 145 lb (65.8 kg)  04/22/19 143 lb 4.8 oz (65 kg)    BP (!) 150/80   Pulse 60   Temp (!) 97.5 F (36.4 C) (Temporal)   Ht 5\' 6"  (1.676 m)   Wt 146 lb (66.2 kg)   SpO2 96%   BMI 23.57 kg/m   Assessment and Plan: 1. Benign essential HTN Fair control of BP today Recently increased losartan to 100 mg She will follow up with Cardiology in the near future - Comprehensive metabolic panel - losartan (COZAAR) 50 MG tablet; Take 2 tablets (100 mg total) by mouth daily.  Dispense: 180 tablet; Refill: 0  2. Gastroesophageal reflux disease, unspecified whether esophagitis present With [facility] by pt report GI appointment with Dr. Denice Paradise is upcoming to discuss further  3. Hypothyroidism due to acquired atrophy of thyroid Supplemented; check labs - TSH + free T4  4. High triglycerides Recent lipids good except for triglycerides She will continue Vascepa and atorvastatin - atorvastatin (LIPITOR) 20 MG tablet; Take 1 tablet (20 mg total) by mouth at bedtime.  Dispense: 90 tablet; Refill: 0  5. Simple chronic bronchitis (HCC) - levalbuterol (XOPENEX) 0.63 MG/3ML nebulizer solution; Take 3 mLs (0.63 mg total) by nebulization every 4 (four) hours as needed for wheezing or shortness of breath.  Dispense: 300 mL; Refill: 2 - Ambulatory referral to Pulmonology  6. Vitamin D deficiency Continue supplementation and check levels - Ergocalciferol (VITAMIN D2) 50 MCG (2000 UT) TABS; Take 1 tablet by mouth daily.  Dispense: 30 tablet; Refill: 0 - VITAMIN D 25 Hydroxy (Vit-D Deficiency, Fractures)  7. Iron deficiency anemia, unspecified iron deficiency anemia type Last CBC was normal but pt insists that she needs the supplement Will wait for GI to do to EGD to determine need - ferrous sulfate 325 (65 FE) MG tablet; Take 1 tablet (325 mg total) by mouth daily with breakfast.  Dispense: 90 tablet; Refill: 0  8. Irritable bowel syndrome with  constipation Continue Lactulose daily PRN Await GI evaluation - Lactulose 20 GM/30ML SOLN; 30 ml every 4 hours until constipation is relieved  Dispense: 236 mL; Refill: 0  9. Anasarca Will need labs before I will write a prescription for nutritional supplement She needs to eat at least 2 meals a day that are available from her current living situation - Prealbumin - Amb ref to Medical Nutrition Therapy-MNT  10. Dry eye syndrome of both eyes Pt needs to establish with Ophthal. She has been dismissed from [facility]. - Ambulatory referral to Ophthalmology  11. Malnutrition of mild degree ([place]) Pt requested referral to a program at [facility] - I personally doubt that she can get into the program as it appears to be a research study from what she showed me on her phone - Amb ref to Medical Nutrition Therapy-MNT - Referral to Chronic Care Management Services  12. Recurrent major depressive disorder, in partial remission ([place]) Currently pt claims to be taking Paxil and lorazepam.  She has been seen by a Psych practice in [place] where she plans to continue I declined her request for lorazepam but I did give her a short prescription for Hydroxyzine  Patient claims that she is dependent on a trust fund and that the administrator will not release funds for her.  She is currently out of money and has only one more day at the hotel.  She requests that I write a letter on her  behalf to the [employer] which I declined.  Partially dictated using Editor, commissioning. Any errors are unintentional.  Halina Maidens, MD [facility]  04/29/2019

## 2019-04-30 ENCOUNTER — Telehealth: Payer: Self-pay | Admitting: Licensed Clinical Social Worker

## 2019-04-30 LAB — TSH+FREE T4
Free T4: 1.21 ng/dL (ref 0.82–1.77)
TSH: 3.45 u[IU]/mL (ref 0.450–4.500)

## 2019-04-30 LAB — COMPREHENSIVE METABOLIC PANEL
ALT: 20 IU/L (ref 0–32)
AST: 25 IU/L (ref 0–40)
Albumin/Globulin Ratio: 1.8 (ref 1.2–2.2)
Albumin: 4.4 g/dL (ref 3.7–4.7)
Alkaline Phosphatase: 113 IU/L (ref 39–117)
BUN/Creatinine Ratio: 14 (ref 12–28)
BUN: 11 mg/dL (ref 8–27)
Bilirubin Total: <0.2 mg/dL (ref 0.0–1.2)
CO2: 23 mmol/L (ref 20–29)
Calcium: 9.8 mg/dL (ref 8.7–10.3)
Chloride: 97 mmol/L (ref 96–106)
Creatinine, Ser: 0.76 mg/dL (ref 0.57–1.00)
GFR calc Af Amer: 91 mL/min/{1.73_m2} (ref >59)
GFR calc non Af Amer: 79 mL/min/{1.73_m2} (ref >59)
Globulin, Total: 2.4 g/dL (ref 1.5–4.5)
Glucose: 82 mg/dL (ref 65–99)
Potassium: 4.9 mmol/L (ref 3.5–5.2)
Sodium: 135 mmol/L (ref 134–144)
Total Protein: 6.8 g/dL (ref 6.0–8.5)

## 2019-04-30 LAB — VITAMIN D 25 HYDROXY (VIT D DEFICIENCY, FRACTURES): Vit D, 25-Hydroxy: 36.2 ng/mL (ref 30.0–100.0)

## 2019-04-30 LAB — PREALBUMIN: PREALBUMIN: 22 mg/dL (ref 9–32)

## 2019-04-30 NOTE — Telephone Encounter (Signed)
I think that a discussion of insomnia is better undertaken with her Psychiatrist.   I do not see any previous visits with a Dr. Alvira Monday (what specialty?) at [facility] so I have no diagnosis to use.  It would need to be a problem like sleep apnea.

## 2019-04-30 NOTE — Telephone Encounter (Signed)
CSW received referral for support around issues of homelessness and other SDOH. CSW completed chart review and aware that Eula Fried, LCSW is involved through [facility]. CSW will defer lead on this case to [facility]. CSW left message for [employer], LCSW. Raquel Sarna, [place], [place]

## 2019-05-01 ENCOUNTER — Ambulatory Visit: Payer: Self-pay | Admitting: Licensed Clinical Social Worker

## 2019-05-01 ENCOUNTER — Telehealth: Payer: Self-pay | Admitting: Internal Medicine

## 2019-05-01 DIAGNOSIS — F4310 Post-traumatic stress disorder, unspecified: Secondary | ICD-10-CM

## 2019-05-01 DIAGNOSIS — I10 Essential (primary) hypertension: Secondary | ICD-10-CM

## 2019-05-01 DIAGNOSIS — F319 Bipolar disorder, unspecified: Secondary | ICD-10-CM

## 2019-05-01 DIAGNOSIS — J441 Chronic obstructive pulmonary disease with (acute) exacerbation: Secondary | ICD-10-CM

## 2019-05-01 DIAGNOSIS — E441 Mild protein-calorie malnutrition: Secondary | ICD-10-CM

## 2019-05-01 NOTE — Telephone Encounter (Signed)
Spoke with from representative from [employer] she stated they have a program called [facility],but it takes several months to get placement started. Organization could not provide any additional assistance at this time.

## 2019-05-01 NOTE — Telephone Encounter (Signed)
[facility], spoke with representative Joy. She stated they only assist with women and children, not single women. Advised that patient needs to call the Housing Hotline at [phone] in order to do intake to be placed with the type of shelter she needs.

## 2019-05-01 NOTE — Telephone Encounter (Signed)
Spoke with Leannette at [facility], she stated they mostly deal with individuals that have mental issues, not necessairly homlessness. She suggested to call [facility] for [place] organization.

## 2019-05-01 NOTE — Telephone Encounter (Signed)
[place] left message for Caseworker Marya Amsler Barns to give Care Guide a call back regarding there resources they have available dealing with homelessness.

## 2019-05-01 NOTE — Telephone Encounter (Signed)
[place] left message for Verlon Setting to give Care Guide a call back to see if any emergency shelter is available for Mrs. Angela [name].

## 2019-05-01 NOTE — Chronic Care Management (AMB) (Signed)
Chronic Care Management    Clinical Social Work Follow Up Note  05/01/2019 Name: Angela [name] MRN: [id number] DOB: 09-Apr-1950  Angela [name] is a 69 y.o. year old female who is a primary care patient of Army Melia Jesse Sans, MD. The CCM team was consulted for assistance with [employer] .   Review of patient status, including review of consultants reports, other relevant assessments, and collaboration with appropriate care team members and the patient's provider was performed as part of comprehensive patient evaluation and provision of chronic care management services.    SDOH (Social Determinants of Health) assessments performed: Yes    Outpatient Encounter Medications as of 05/01/2019  Medication Sig  . acetaminophen (TYLENOL) 650 MG CR tablet Take 650 mg by mouth every 8 (eight) hours as needed for pain.  . Acetylcysteine (NAC) 500 MG CAPS Take 500 mg by mouth 2 (two) times a day.  . albuterol (VENTOLIN HFA) 108 (90 Base) MCG/ACT inhaler Inhale 2 puffs into the lungs every 4 (four) hours as needed for wheezing or shortness of breath.  . Amino Acids-Protein Hydrolys (FEEDING SUPPLEMENT, PRO-STAT 64,) LIQD Take 30 mLs by mouth daily. (Patient not taking: Reported on 04/29/2019)  . atorvastatin (LIPITOR) 20 MG tablet Take 1 tablet (20 mg total) by mouth at bedtime.  . Biotin 10000 MCG TABS Take 10,000 mcg by mouth daily.   . Calcium Citrate (CITRACAL PO) Take 800 mg by mouth. Takes 2 (400mg ) tablets BID  . Cholecalciferol 25 MCG (1000 UT) tablet Take 1 tablet (1,000 Units total) by mouth daily. (Patient not taking: Reported on 04/29/2019)  . Cyanocobalamin (VITAMIN B-12) 1000 MCG SUBL Place 1,000 mcg under the tongue.   . diclofenac sodium (VOLTAREN) 1 % GEL Apply small amount (2 grams) topically over knees or hands up to four times a day if needed for arthritis (Patient not taking: Reported on 04/29/2019)  . Ergocalciferol (VITAMIN D2) 50 MCG (2000 UT) TABS Take 1 tablet by  mouth daily.  . famotidine (PEPCID) 20 MG tablet Take 1 tablet (20 mg total) by mouth 2 (two) times daily as needed for heartburn or indigestion.  . ferrous sulfate 325 (65 FE) MG tablet Take 1 tablet (325 mg total) by mouth daily with breakfast.  . hydrOXYzine (ATARAX/VISTARIL) 10 MG tablet Take 1 tablet (10 mg total) by mouth every 8 (eight) hours as needed.  Vanessa Kick Ethyl (VASCEPA) 1 g CAPS TAKE 2 CAPSULES BY MOUTH TWICE DAILY (TO HELP WITH TRIGLYCERIDES) (Patient not taking: Reported on 04/29/2019)  . Lactulose 20 GM/30ML SOLN 30 ml every 4 hours until constipation is relieved  . levalbuterol (XOPENEX) 0.63 MG/3ML nebulizer solution Take 3 mLs (0.63 mg total) by nebulization every 4 (four) hours as needed for wheezing or shortness of breath.  . levothyroxine (SYNTHROID) 50 MCG tablet Take 1 tablet (50 mcg total) by mouth daily at 6 (six) AM. (Patient taking differently: Take 100 mcg by mouth daily at 6 (six) AM. )  . linaclotide (LINZESS) 290 MCG CAPS capsule Take 1 capsule (290 mcg total) by mouth daily before breakfast.  . LORazepam (ATIVAN) 0.5 MG tablet TAKE 1 TABLET BY MOUTH AS NEEDED ONCE DAILY  . losartan (COZAAR) 50 MG tablet Take 2 tablets (100 mg total) by mouth daily.  . Magnesium 500 MG TABS Take 500 mg by mouth every other day.  Marland Kitchen PARoxetine (PAXIL) 20 MG tablet Take 20 mg by mouth daily.  . prednisoLONE acetate (PRED FORTE) 1 % ophthalmic suspension INSTILL 1 DROP  INTO EACH EYE TWICE DAILY  . pregabalin (LYRICA) 75 MG capsule Take 75 mg by mouth 2 (two) times daily.   Marland Kitchen PREMARIN vaginal cream Place 1 Applicatorful vaginally 2 (two) times a week.  . promethazine (PHENERGAN) 12.5 MG tablet Take 12.5 mg by mouth every 6 (six) hours as needed for nausea or vomiting.  . pyridoxine (B-6) 100 MG tablet Take 1 tablet (100 mg total) by mouth 2 (two) times daily. (Patient not taking: Reported on 04/29/2019)  . tiZANidine (ZANAFLEX) 2 MG tablet Take by mouth every 6 (six) hours as needed  for muscle spasms.  Marland Kitchen triamcinolone ointment (KENALOG) 0.5 % Apply 1 application topically 2 (two) times daily. (Patient not taking: Reported on 04/29/2019)  . umeclidinium bromide (INCRUSE ELLIPTA) 62.5 MCG/INH AEPB Inhale 1 puff into the lungs daily.  Marland Kitchen XIIDRA 5 % SOLN INSTILL 1 DROP INTO EACH EYE TWICE DAILY  . [DISCONTINUED] traZODone (DESYREL) 50 MG tablet Take 1 tablet (50 mg total) by mouth at bedtime as needed. Pt takes 1/2 tablet qhs   No facility-administered encounter medications on file as of 05/01/2019.     Goals Addressed    . "I am going to be homeless as of tonight." (pt-stated)       CARE PLAN ENTRY (see longitudinal plan of care for additional care plan information)  Current Barriers:  . Financial constraints related to housing . Limited social support . ADL IADL limitations . Mental Health Concerns  . Social Isolation . Limited access to caregiver . Inability to perform ADL's independently . Inability to perform IADL's independently  Clinical Social Work Clinical Goal(s):  Marland Kitchen Over the next 120 days, patient will work with SW to address concerns related to finding stable housing . Over the next 120 days, patient will demonstrate improved health management independence as evidenced by implementing appropriate self-care into her daily routine to improve overall physical and mental health.    Interventions . Inter-disciplinary care team collaboration (see longitudinal plan of care) . Patient is new to practice and has extensive documentation in her chart-Homeless, depressed, paranoid, not eating well.  Patient is currently living in a motel because she had mold in her home but will have to leave there tonight. She is requesting help with living expenses, medications, food, housing. . Patient interviewed and appropriate assessments performed . Provided mental health counseling with regard to current stressors . Discussed plans with patient for ongoing care management  follow up and provided patient with direct contact information for care management team . Advised patient to utilize crisis, financial, food, housing and mental health resources that were provided   . Assisted patient/caregiver with obtaining information about health plan benefits . Provided education to patient/caregiver regarding level of care options. . Encouraged patient to consider finding a mental health provider for long term follow up and therapy/counseling . LCSW collaborated with C3 Guide in regards to housing needs. Patient reports that she is willing to relocate if needed. Patient mentioned wanting to relocate to [facility].  Marland Kitchen LCSW sent secure email to patient with all community resources that were discussed today.   Patient Self Care Activities:  . Calls provider office for new concerns or questions . Lacks social connections  Initial goal documentation     Eula Fried, BSW, MSW, [email or web address] Phone: [phone]

## 2019-05-01 NOTE — Telephone Encounter (Signed)
Spoke to representative at [employer]. Representative stated that their organization is not accepting any new clients at this time. Also stated that patient would have to call [facility] to get registered and be willing to take a COVID-19 test in order to be placed with their facility.

## 2019-05-01 NOTE — Telephone Encounter (Signed)
   SF 05/01/2019   Name: Angela [name]   MRN: [id number]   DOB: [birth year]   AGE: 69 y.o.   GENDER: female   PCP Glean Hess, MD.   Called pt regarding Community Resource Referral for emergency housing referral. Called patient twice at number on file, but patient did not answer and voicemail is full could not leave message. Care Guide wanted to inform patient that several resources were called today and that some organizations are not accepting individuals right now and that Care Guide left messages with several organizations and have to wait for them to call office back.   [place], Care Management Phone: [phone] Email: [email or web address]

## 2019-05-01 NOTE — Telephone Encounter (Signed)
[facility] currently do not have any funds for hotel/motel suggest that paitent can find a friend or family member that she can stay with. They may provide a monetary contribution to the person for letting her stay. Their shelters have a long waiting list. [facility] has a [employer] Cot  program at that runs through April 30th (currently has no room at this time).

## 2019-05-01 NOTE — Telephone Encounter (Signed)
Care Guide called [facility], but no one answered and could not leave a message.

## 2019-05-01 NOTE — Telephone Encounter (Signed)
   SF 05/01/2019   Name: Angela [name]   MRN: [id number]   DOB: [birth year]   AGE: 69 y.o.   GENDER: female   PCP Glean Hess, MD.   Called pt regarding [facility] Referral emergency housing. Patient stated Had to leave from Apartment due to mold. Would like to find a place in [place], but if a place is found in [facility] patient stated she is willing to travel. Patient stated she called [name] in [place] and in [place], but has not heard anything back yet. [name] stated she has a friend to help come pick up medication and that her presciptions are covered by Medicaid. Patient is suffering. No Income, patient called the bank with no luck. Has been calling many places, but no luck. Patient needs help with moving expenses. Patient also stated she needs assistance with eye doctor and other medical expenses.      [place], Care Management Phone: [phone] Email: [email or web address]

## 2019-05-01 NOTE — Telephone Encounter (Signed)
[facility] for shelter resources. Spoke with representative that stated the intake specialists was unavailable, but Care Guide gave name and phone number to recieve a call back to get assistance for Angela [name]. Representative also advise that a good place to call would be [facility] in [place]
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
## 2019-05-01 NOTE — Telephone Encounter (Signed)
Tried calling pt. Phone went to VM but mailbox is full so cannot leave a message.  CM

## 2019-05-01 NOTE — Telephone Encounter (Signed)
[facility] in [place], left message for intake representative to give Care Guide a call back.

## 2019-05-02 ENCOUNTER — Telehealth: Payer: Self-pay | Admitting: Internal Medicine

## 2019-05-02 NOTE — Telephone Encounter (Signed)
Received call back from [facility]. Spoke with [name], she stated that currently there is no room at the [facility]. Suggested to call [facility] to see if they will be able to assist with housing needs. Angela [name] also stated to try and give their office a call back in a couple of days to see if they have any openings.

## 2019-05-02 NOTE — Telephone Encounter (Signed)
Pt called and  left message on machine. She was very short of breath. She wants to go to Dr Kathyrn Sheriff, "who specializes in black mold." She refuses, per her voicemail, to go to ER. I tried to call her back- no way to leave a message/ box is full.

## 2019-05-02 NOTE — Telephone Encounter (Signed)
Patient stated that her fingers are swollen and the her ribs hurt. She feels that she has black mold poisining she stated that she is in a lot of pain that her joints are swollen. She states she has cold symptoms and it's painful for her to eat. Patient stated that she is not sure who she spoke with in the office before, but she did not ask for help with housing she stated that right now she wants/needs medical attention. Care Guide forward message to [facility] Clinical pool and social worker.  Also connected patient with office to receive additional help regarding her medical issues.

## 2019-05-02 NOTE — Telephone Encounter (Signed)
   SF 05/02/2019   Name: Angela [name]   MRN: [id number]   DOB: [birth year]   AGE: 69 y.o.   GENDER: female   PCP Glean Hess, MD.   Called pt regarding Community Resource Referral for assistance with housing. Patient was called several times to try and connect her with office. Patient finally answered after the fifth call. She stated that she did not want to speak to anyone but the doctor and that she has been on the phone all day and does not feel like repeating herself again. Informed patient that Care Guide was trying to get her to speak with someone in the office so that she can let someone know what's going on and so she can get the help she needs. Patient reluctantly stayed on the phone, but did not get a chance to leave a message with the nurse because phone was disconnected. Care Guide sent message to social worker, Jettie Booze asking her to reach out to patient to further assist with getting her the help she needs.    [place], Care Management Phone: [phone] Email: [email or web address]

## 2019-05-02 NOTE — Telephone Encounter (Signed)
Received Call back from [employer]. Representative stated that currently in [facility] all of the shelters are full. Representative suggested that patient call the [facility] because they have a different Intake process. Care Guide called [place] regarding [employer] on 05/01/19. Still waiting on return call to see if they have available space.

## 2019-05-02 NOTE — Telephone Encounter (Signed)
   SF 05/02/2019   Name: Angela [name]   MRN: [id number]   DOB: 05/21/1950   AGE: 69 y.o.   GENDER: female   PCP Glean Hess, MD.   Called pt regarding Community Resource Referral for assistance with housing. Informed patient that several orgranizations were called, but currently some places do not have openings and Care Guide is waiting to hear back from other organizations. Patient stated that her fingers are swollen and the her ribs hurt. She feels that she has black mold poisining she stated that she is in a lot of pain that her joints are swollen. She states she has cold symptoms and it's painful for her to eat. Patient stated that she is not sure who she spoke with in the office before, but she did not ask for help with housing she stated that right now she wants/needs medical attention. Care Guide forward message to [facility] Clinical pool. Also connected patient with office to receive additional help regarding her medical issues.     Follow up on: 05/02/2019  [place], Care Management Phone: [phone] Email: [email or web address]

## 2019-05-05 NOTE — Telephone Encounter (Signed)
Spoke with Angela [name] today. Informed her that the office has been trying to contact her regarding her medical issues. Angela [name] asked if someone can give her a call because she would like to set up an appointment for tomorrow (05/06/19) at the office. She stated she has an eye appointment and would like to be seen if she can before her eye appointment. Informed patient that Care Guide would send message to staff. Patient stated understanding.

## 2019-05-05 NOTE — Telephone Encounter (Signed)
° °  SF 05/05/2019    Name: Angela [name]    MRN: [id number]    DOB: September 26, 1950    AGE: 69 y.o.    GENDER: female    PCP Glean Hess, MD.   Called pt regarding Community Resource Referral for housing assistance. Spoke with [name] regarding housing assistance today. She stated that currently she is staying at a motel in [place]. She was staying at a motel in [place], but stated it started to be too expensive so she had to leave. She is currently depending on friends to cover her expenses. Asked patient if she is still in need of housing assistance, patient stated that she would like to find temporary housing until she can find something permanent. Informed patient that Care Guide will continue to look for resources.    [place], Care Management Phone: [phone] Email: [email or web address]

## 2019-05-05 NOTE — Telephone Encounter (Signed)
Tried calling pt and mailbox is still full. Unable to reach her and phone went straight to VM.

## 2019-05-05 NOTE — Telephone Encounter (Signed)
Tried calling pt. VM full and cannot leave a message.

## 2019-05-06 ENCOUNTER — Other Ambulatory Visit: Payer: Self-pay | Admitting: Dermatology

## 2019-05-06 NOTE — Telephone Encounter (Signed)
Received call from [facility]. Representative stated that patient will need to give them a call to complete application and once eligibility is determined patient will receive a call.

## 2019-05-07 DIAGNOSIS — K5900 Constipation, unspecified: Secondary | ICD-10-CM | POA: Diagnosis not present

## 2019-05-07 DIAGNOSIS — K219 Gastro-esophageal reflux disease without esophagitis: Secondary | ICD-10-CM | POA: Diagnosis not present

## 2019-05-07 DIAGNOSIS — K449 Diaphragmatic hernia without obstruction or gangrene: Secondary | ICD-10-CM | POA: Diagnosis not present

## 2019-05-07 NOTE — Telephone Encounter (Signed)
Librarian, academic. There office is  only open on Monday, Tuesday, and Friday 9am-1pm Left message for Caseworker Candyce Churn to give Care Guide a call regarding any resources they may have available.

## 2019-05-07 NOTE — Telephone Encounter (Signed)
   SF 05/07/2019   Name: Angela [name]   MRN: [id number]   DOB: 1950/04/05   AGE: 69 y.o.   GENDER: female   PCP Glean Hess, MD.   Called pt regarding Community Resource Referral for housing. [name] did not answer and cannot leave message because voicemail box is full. Trying to reach patient to give her a couple of resources that she will need to call to initiate possible services. Patient will need to contact [employer], [employer] [place], and [employer]. These three organizations require that patient calls their organization to complete application or intake process.     [place], Care Management Phone: [phone] Email: [email or web address]

## 2019-05-08 NOTE — Telephone Encounter (Signed)
Mailbox is full and can not accept any messages at this time. Will close encounter and with recent address changes not sure where to send letter. Will close for now and await for her to call back at later date.

## 2019-05-08 NOTE — Telephone Encounter (Signed)
Spoke with patient and she states that she would like to reschedule her appointment with Dr. Rockey Situ. She has been considering moving out of state due to the health systems in this area mentioning [place], [place], and [place]. She states that she finally went to [facility] and they were able to assist her. She states that she continues to have some shortness of breath and would like to see Dr. Rockey Situ once more before moving out of state. Scheduled her to come in and expressed importance of arriving early to allow entry through [facility] and then transport down here to our office. She verbalized understanding, was appreciative for the call with no further questions at this time.

## 2019-05-09 NOTE — Telephone Encounter (Signed)
   SF 05/09/2019   Name: Angela [name]   MRN: [id number]   DOB: 16-Oct-1950   AGE: 69 y.o.   GENDER: female   PCP Glean Hess, MD.   Called pt regarding Community Resource Referral for assistance with housing. Care Guide has called patient but has been unable to reach. Patient's voicemail box is full and is not taking any messages. Referral will be closed due to not being able to reach patient to give her the resources she needs. Care Guide will send a message to provider.  Closing referral pending any other needs of patient.     [place], Care Management Phone: [phone] Email: [email or web address]

## 2019-05-13 NOTE — Telephone Encounter (Signed)
Pt requesting return call from Dr. Gaspar Cola CMA. Please advise. Pt has several questions regarding referrals and black mold. Agent tried to answer questions but she asked for someone else.

## 2019-05-14 ENCOUNTER — Other Ambulatory Visit: Payer: Self-pay

## 2019-05-14 MED ORDER — CHOLECALCIFEROL 25 MCG (1000 UT) PO TABS: 1000.0000 [IU] | ORAL_TABLET | Freq: Every day | ORAL | 5 refills | Status: DC

## 2019-05-14 NOTE — Telephone Encounter (Signed)
Called and spoke with patient over the phone. She said she has not been contacted for any of the referrals Dr Army Melia placed at her visit. Gave her all three numbers of the specialists offices to call and schedule appts since it looks like the referrals have already been faxed. She also said we need to contact the pharmacy [facility] ) to update her Vit D2 prescription because they said it can not be updated without Drs. Consent. Told her I will call and get this done.   She asked that I would mail her the most recent lab results- verified mailing address and mailed out labs this morning.   CM

## 2019-05-15 ENCOUNTER — Telehealth: Payer: Self-pay | Admitting: Internal Medicine

## 2019-05-15 NOTE — Telephone Encounter (Signed)
Copied from [place] [id number]. Topic: Referral - Request for Referral >> May 15, 2019  2:13 PM Percell Belt A wrote: Has patient seen PCP for this complaint? She stated yes/  she stated this has been going on for years.  She would [facility] go [facility]/  *If NO, is insurance requiring patient see PCP for this issue before PCP can refer them? Referral for which specialty: ENT  Preferred provider/office: [facility] or [facility] Reason for referral: She stated this is something that she has been seeing ENT Dr for in the past and no one has been able to tell her what is going on.  She would like a referral to a [facility] or [facility] ENT  Best number [phone]

## 2019-05-19 ENCOUNTER — Other Ambulatory Visit: Payer: Self-pay

## 2019-05-19 ENCOUNTER — Telehealth: Payer: Self-pay

## 2019-05-19 ENCOUNTER — Other Ambulatory Visit: Payer: Self-pay | Admitting: Internal Medicine

## 2019-05-19 DIAGNOSIS — E559 Vitamin D deficiency, unspecified: Secondary | ICD-10-CM

## 2019-05-19 MED ORDER — VITAMIN D (ERGOCALCIFEROL) 1.25 MG (50000 UNIT) PO CAPS: 50000.0000 [IU] | ORAL_CAPSULE | ORAL | 3 refills | Status: DC

## 2019-05-19 NOTE — Telephone Encounter (Signed)
Tried calling patient. Mailbox full and unable to reach pt at this time. If patient needs another referral she needs to come in and discuss why she needs this referral. Dr. Army Melia placed several referrals last visit and none discuss ear/ throat issues.  CM

## 2019-05-19 NOTE — Telephone Encounter (Signed)
I sent it in to [facility].

## 2019-05-19 NOTE — Telephone Encounter (Signed)
Patient called complaining that we sent in the wrong Vit D Rx. She wants toe ERGO VIT D. - 50, 000 units weekly.   Please advise? CM

## 2019-05-25 NOTE — Progress Notes (Signed)
Cardiology Office Note  Date:  05/26/2019   ID:  Angela [name], Angela [name] 1950-12-06, MRN [id number]  PCP:  Glean Hess, MD   Chief Complaint  Patient presents with  . other    Follow up from [facility]; chest pain. Meds reviewed by the pt. verbally. Pt. c/o LE edema, shortness of breath and chest pain.     HPI:  Patient has a past medical history of COPD Leg edema Bipolar;/personality disorder/Depression/ PTSD seen by psychiatry Former smoker, stopped in her 52s large hiatal hernia Scoliosis, On CT: Minimal aortic athero in the descending aorta No significant coronary calcification, no significant carotid calcification Normal echo 12/2018 Who presents for f/u of her atypical  chest pain/HTN  Last visit 09/2018  Lives in [place], just moved in "musty", "mold in dishwasher, under sink" Had difficulty breathing from mold Reports that her trust fund moved her out, moved in hotel 2 weeks, "trust fund will pay for movers, not for unpackers" Now in the new place,  Difficulty unpacking, unable to bend and move secondary to fullness in the chest from her hiatal hernia "afraid the hernia has grown" over the past several months She is requesting repeat x-ray or CT CT scan reviewed February 2021 BP elevated, "cant get the BP cuff unpacked"  Had appt with Dr. Denice Paradise today, for EGD Could not make it as she had appointment with our clinic today  can't eat, "can't breath secondary to hiatal hernia  Seen in the emergency room April 22, 2019 for chest pain Also reported having shortness of breath from prior mold exposure Cardiac enzymes negative Felt her symptoms were from hiatal hernia  Seen in the emergency room April 11, 2019 for shortness of breath at [facility] Again was concerned about mold in her apartment causing her shortness of breath Note indicating it was the second visit in 2 days  Numerous other trips to the emergency room for various reasons  EKG personally reviewed by myself  on todays visit Shows NSR rate 57 bpm, no significant ST or T wave changes  Other past medical history reviewed Seen in the ER 07/11/2018, muiltiple issues, including chest pain CT angio chest: Large hiatal hernia unchanged. No significant atherosclerosis  Seen in the emergency room February 2020 for shortness of breath And left-sided chest pain Work-up negative  Seen by Dr. Philis Kendall Would likely need to undergo laparoscopic hiatal hernia repair and fundoplication, possible gastropexy.   Prior to procedure was referred to GI in [facility] for EGD colonoscopy and management of her chronic constipation    Prior CV studies:   The following studies were reviewed today: Echo 2017 - Left ventricle: The cavity size was normal. Wall thickness was   normal. Systolic function was normal. The estimated ejection   fraction was in the range of 60% to 65%. Wall motion was normal;   there were no regional wall motion abnormalities. Doppler   parameters are consistent with abnormal left ventricular   relaxation (grade 1 diastolic dysfunction). - Mitral valve: There was mild regurgitation. - Right atrium: The right atrium appears to be compressed by an   outside structure likely the known hiatal hernia.   PMH:   has a past medical history of Agitation (09/18/2017), Allergic rhinitis, Altered mental status, unspecified (12/25/2018), Anemia, Blurred vision, Chest pain of uncertain etiology (Q000111Q), Confusion state (12/25/2018), Depression, Diverticulosis, Endometriosis, Falls, GERD (gastroesophageal reflux disease), Hernia, hiatal, Hypothyroidism, IBS (irritable bowel syndrome), Labile mood, Malignant neoplasm of skin, Migraine, Neuropathy, Noncompliance (12/25/2017), PTSD (  post-traumatic stress disorder), and Thyroid disease.  PSH:    Past Surgical History:  Procedure Laterality Date  . APPENDECTOMY    . CHOLECYSTECTOMY    . CHOLECYSTECTOMY, LAPAROSCOPIC    . ESOPHAGOGASTRODUODENOSCOPY  (EGD) WITH PROPOFOL N/A 03/29/2015   Procedure: ESOPHAGOGASTRODUODENOSCOPY (EGD) WITH PROPOFOL;  Surgeon: Josefine Class, MD;  Location: [facility] ENDOSCOPY;  Service: Endoscopy;  Laterality: N/A;  . HERNIA REPAIR    . laproscopy    . TONSILLECTOMY    . uteral suspension      Current Outpatient Medications  Medication Sig Dispense Refill  . acetaminophen (TYLENOL) 650 MG CR tablet Take 650 mg by mouth every 8 (eight) hours as needed for pain.    . Acetylcysteine (NAC) 500 MG CAPS Take 500 mg by mouth 2 (two) times a day.    . albuterol (VENTOLIN HFA) 108 (90 Base) MCG/ACT inhaler Inhale 2 puffs into the lungs every 4 (four) hours as needed for wheezing or shortness of breath. 18 g 3  . Amino Acids-Protein Hydrolys (FEEDING SUPPLEMENT, PRO-STAT 64,) LIQD Take 30 mLs by mouth daily. 887 mL 0  . atorvastatin (LIPITOR) 20 MG tablet Take 1 tablet (20 mg total) by mouth at bedtime. 90 tablet 0  . Biotin 10000 MCG TABS Take 10,000 mcg by mouth daily.     . Calcium Citrate (CITRACAL PO) Take 800 mg by mouth. Takes 2 (400mg ) tablets BID    . Cholecalciferol 100 MCG (4000 UT) CAPS Take 4,000 capsules by mouth daily.    . clobetasol (TEMOVATE) 0.05 % external solution Apply topically as directed. Qd to aa itchy rash until clear, avoid face, groin, axilla 50 mL 0  . Cyanocobalamin (VITAMIN B-12) 1000 MCG SUBL Place 1,000 mcg under the tongue.     . diclofenac sodium (VOLTAREN) 1 % GEL Apply small amount (2 grams) topically over knees or hands up to four times a day if needed for arthritis 100 g 0  . famotidine (PEPCID) 20 MG tablet Take 1 tablet (20 mg total) by mouth 2 (two) times daily as needed for heartburn or indigestion. 180 tablet 0  . ferrous sulfate 325 (65 FE) MG tablet Take 1 tablet (325 mg total) by mouth daily with breakfast. 90 tablet 0  . hydrOXYzine (ATARAX/VISTARIL) 10 MG tablet Take 1 tablet (10 mg total) by mouth every 8 (eight) hours as needed. 30 tablet 0  . Icosapent Ethyl (VASCEPA)  1 g CAPS TAKE 2 CAPSULES BY MOUTH TWICE DAILY (TO HELP WITH TRIGLYCERIDES) 180 capsule 0  . Lactulose 20 GM/30ML SOLN 30 ml every 4 hours until constipation is relieved 236 mL 0  . levalbuterol (XOPENEX) 0.63 MG/3ML nebulizer solution Take 3 mLs (0.63 mg total) by nebulization every 4 (four) hours as needed for wheezing or shortness of breath. 300 mL 2  . levothyroxine (SYNTHROID) 50 MCG tablet Take 1 tablet (50 mcg total) by mouth daily at 6 (six) AM. (Patient taking differently: Take 100 mcg by mouth daily at 6 (six) AM. ) 30 tablet 0  . linaclotide (LINZESS) 290 MCG CAPS capsule Take 1 capsule (290 mcg total) by mouth daily before breakfast. 30 capsule 0  . LORazepam (ATIVAN) 0.5 MG tablet TAKE 1 TABLET BY MOUTH AS NEEDED ONCE DAILY    . losartan (COZAAR) 50 MG tablet Take 2 tablets (100 mg total) by mouth daily. 180 tablet 0  . Magnesium 500 MG TABS Take 500 mg by mouth every other day.    Marland Kitchen PARoxetine (PAXIL) 20 MG tablet  Take 20 mg by mouth daily.    . prednisoLONE acetate (PRED FORTE) 1 % ophthalmic suspension INSTILL 1 DROP INTO EACH EYE TWICE DAILY    . pregabalin (LYRICA) 75 MG capsule Take 75 mg by mouth 2 (two) times daily.     Marland Kitchen PREMARIN vaginal cream Place 1 Applicatorful vaginally 2 (two) times a week. 60 g 2  . promethazine (PHENERGAN) 12.5 MG tablet Take 12.5 mg by mouth every 6 (six) hours as needed for nausea or vomiting.    . pyridoxine (B-6) 100 MG tablet Take 1 tablet (100 mg total) by mouth 2 (two) times daily. 60 tablet 0  . tiZANidine (ZANAFLEX) 2 MG tablet Take by mouth every 6 (six) hours as needed for muscle spasms.    Marland Kitchen triamcinolone ointment (KENALOG) 0.5 % Apply 1 application topically 2 (two) times daily. 30 g 2  . umeclidinium bromide (INCRUSE ELLIPTA) 62.5 MCG/INH AEPB Inhale 1 puff into the lungs daily. 60 each 5  . Vitamin D, Ergocalciferol, (DRISDOL) 1.25 MG (50000 UNIT) CAPS capsule Take 1 capsule (50,000 Units total) by mouth every 7 (seven) days. 12 capsule 3   . XIIDRA 5 % SOLN INSTILL 1 DROP INTO EACH EYE TWICE DAILY     No current facility-administered medications for this visit.     Allergies:   Advair diskus [fluticasone-salmeterol], Amoxicillin-pot clavulanate, Bee venom, Darvon [propoxyphene], Dicyclomine, Molds & smuts, Other, Oxycodone-acetaminophen, Peanut butter flavor, Peanuts [peanut oil], Penicillins, Sulfa antibiotics, Sulfacetamide sodium, Ultram [tramadol], Amikacin, Aspirin, Haloperidol, Hymenoptera venom preparations, Imipramine, Keflex [cephalexin], Lactose, Lactose intolerance (gi), Risperdal [risperidone], Sulfur, Adhesive [tape], Albuterol, Ciprofloxacin, and Prednisone   Social History:  The patient  reports that she quit smoking about 42 years ago. She has never used smokeless tobacco. She reports current alcohol use. She reports that she does not use drugs.   Family History:   family history includes Anxiety disorder in her brother and brother; Arthritis in her brother, brother, and sister; COPD in her father; Cancer in her mother; Depression in her father and sister; Hearing loss in her father; Heart disease in her father; Hernia in her brother and brother; Stroke in her father; Varicose Veins in her father; Vision loss in her father.    Review of Systems: Review of Systems  Constitutional: Negative.   HENT: Negative.   Respiratory: Positive for shortness of breath.   Cardiovascular: Positive for chest pain.  Gastrointestinal: Negative.   Musculoskeletal: Negative.   Neurological: Negative.   Psychiatric/Behavioral: Negative.   All other systems reviewed and are negative.   PHYSICAL EXAM: VS:  BP 132/82 (BP Location: Left Arm, Patient Position: Sitting, Cuff Size: Normal)   Pulse (!) 57   Ht 5\' 3"  (1.6 m)   Wt 146 lb 2 oz (66.3 kg)   SpO2 98%   BMI 25.88 kg/m  , BMI Body mass index is 25.88 kg/m. GEN: Well nourished, well developed, in no acute distress HEENT: normal Neck: no JVD, carotid bruits, or  masses Cardiac: RRR; no murmurs, rubs, or gallops,no edema  Respiratory:  clear to auscultation bilaterally, normal work of breathing GI: soft, nontender, nondistended, + BS MS: no deformity or atrophy Skin: warm and dry, no rash Neuro:  Strength and sensation are intact Psych: euthymic mood, full affect  Recent Labs: 06/08/2018: B Natriuretic Peptide 55.0 10/22/2018: Magnesium 2.3 04/22/2019: Hemoglobin 12.7; Platelets 318 04/29/2019: ALT 20; BUN 11; Creatinine, Ser 0.76; Potassium 4.9; Sodium 135; TSH 3.450    Lipid Panel Lab Results  Component Value  Date   CHOL 199 12/26/2018   HDL 62 12/26/2018   LDLCALC 75 12/26/2018   TRIG 312 (H) 12/26/2018    Wt Readings from Last 3 Encounters:  05/26/19 146 lb 2 oz (66.3 kg)  04/29/19 146 lb (66.2 kg)  04/22/19 145 lb (65.8 kg)     ASSESSMENT AND PLAN:  Problem List Items Addressed This Visit      Cardiology Problems   Benign essential HTN   Aortic atherosclerosis ([place]) - Primary     Other   Hiatal hernia   Chronic bipolar affective disorder ([place])    Other Visit Diagnoses    Atypical chest pain          Prior history atypical chest pain CT reviewed, large hernia, Minimal if any aortic athero No coronary calcification No further work-up at this time  Hiatal hernia, large Phone call placed today to get her appointment as she missed her appointment today as she was coming into cardiology clinic She had booked appointments on the same day Stressed importance that she follow through with EGD and surgery on her hiatal hernia -Recommended no lifting or bending until she gets hiatal hernia fixed Needs help unpacking her boxes, just moved into a new apartment She would like to reimage, likely has not changed it was large before No indication for CT, 1 was done February 2021 X-ray ordered at her urging  HTN Reported having high blood pressure at home, numerous medication intolerances Blood pressure reasonable today, no  changes made  Hyperlipidemia On statin  Atypical rib pain Sleeping on a blowup mattress Recommend she talk with primary care to get a proper bed With her hiatal hernia has trouble sleeping flat, may do better with slight incline  No active cardiac issues at this time Disposition:   F/U as needed  Numerous phone calls made on her behalf to [facility], Letter is written for her trust for assistance with unpacking  Total encounter time more than 45 minutes  Greater than 50% was spent in counseling and coordination of care with the patient    Signed, Esmond Plants, M.D., Ph.D. [place], [place]

## 2019-05-26 ENCOUNTER — Encounter: Payer: Self-pay | Admitting: Cardiovascular Disease

## 2019-05-26 ENCOUNTER — Ambulatory Visit
Admission: RE | Admit: 2019-05-26 | Discharge: 2019-05-26 | Disposition: A | Discharge status: Home / Self Care | Payer: Medicare Other | Attending: Cardiovascular Disease | Admitting: Cardiovascular Disease

## 2019-05-26 ENCOUNTER — Ambulatory Visit (INDEPENDENT_AMBULATORY_CARE_PROVIDER_SITE_OTHER): Payer: Medicare Other | Admitting: Cardiovascular Disease

## 2019-05-26 ENCOUNTER — Encounter: Payer: Self-pay | Admitting: *Deleted

## 2019-05-26 ENCOUNTER — Ambulatory Visit
Admission: RE | Admit: 2019-05-26 | Discharge: 2019-05-26 | Disposition: A | Discharge status: Home / Self Care | Payer: Medicare Other | Source: Ambulatory Visit | Attending: Cardiovascular Disease | Admitting: Cardiovascular Disease

## 2019-05-26 ENCOUNTER — Other Ambulatory Visit: Payer: Self-pay

## 2019-05-26 VITALS — BP 132/82 | HR 57 | Ht 63.0 in | Wt 146.1 lb

## 2019-05-26 DIAGNOSIS — K449 Diaphragmatic hernia without obstruction or gangrene: Secondary | ICD-10-CM | POA: Insufficient documentation

## 2019-05-26 DIAGNOSIS — F319 Bipolar disorder, unspecified: Secondary | ICD-10-CM | POA: Diagnosis not present

## 2019-05-26 DIAGNOSIS — R0602 Shortness of breath: Secondary | ICD-10-CM

## 2019-05-26 DIAGNOSIS — I700 Atherosclerosis of aorta: Secondary | ICD-10-CM

## 2019-05-26 DIAGNOSIS — R0789 Other chest pain: Secondary | ICD-10-CM | POA: Diagnosis not present

## 2019-05-26 DIAGNOSIS — I10 Essential (primary) hypertension: Secondary | ICD-10-CM

## 2019-05-26 NOTE — Patient Instructions (Addendum)
Letter "to whom it may concern" Restrictions/can not  Lifting , unpacking Do not advise unpacking with a hernia  Appointment scheduled: Dr. Denice [facility] in [facility]  July 09, 2019 at 3:10 PM [phone]  CXR for SOB, hiatal hernia  Message to [facility] for a adjustable bed, can't sleep secondary to pressure from hiatal hernia   Medication Instructions:  Please talk to pharmacy, look for interaction that will cause dry mouth  If you need a refill on your cardiac medications before your next appointment, please call your pharmacy.    Lab work: No new labs needed   If you have labs (blood work) drawn today and your tests are completely normal, you will receive your results only by: Marland Kitchen MyChart Message (if you have MyChart) OR . A paper copy in the mail If you have any lab test that is abnormal or we need to change your treatment, we will call you to review the results.   Testing/Procedures: No new testing needed   Follow-Up: At [facility], you and your health needs are our priority.  As part of our continuing mission to provide you with exceptional heart care, we have created designated Provider Care Teams.  These Care Teams include your primary Cardiologist (physician) and Advanced Practice Providers (APPs -  Physician Assistants and Nurse Practitioners) who all work together to provide you with the care you need, when you need it.  . You will need a follow up appointment as needed  . Providers on your designated Care Team:   . Murray Hodgkins, NP . Christell Faith, PA-C . Marrianne Mood, PA-C  Any Other Special Instructions Will Be Listed Below (If Applicable).  For educational health videos Log in to : [email or web address] Or : [email or web address], password : [email or web address]

## 2019-05-27 ENCOUNTER — Telehealth: Payer: Self-pay | Admitting: *Deleted

## 2019-05-27 NOTE — Telephone Encounter (Signed)
No answer/Voicemail box is full.  

## 2019-05-27 NOTE — Telephone Encounter (Signed)
Reviewed results with patient and she verbalized understanding with no further questions at this time.  

## 2019-05-27 NOTE — Telephone Encounter (Signed)
-----   Message from Minna Merritts, MD sent at 05/27/2019  7:56 AM EDT ----- Stable hiatal hernia on CXR

## 2019-05-28 ENCOUNTER — Telehealth: Payer: Self-pay | Admitting: Internal Medicine

## 2019-05-28 NOTE — Telephone Encounter (Signed)
I spoke with the pt and notified that she is d/c from the practice as of 12/20/2018 and can not be seen here by Dr Patsey Berthold or Mortimer Fries.  She has been taken off the recall list. Nothing further needed.

## 2019-05-29 ENCOUNTER — Telehealth: Payer: Self-pay | Admitting: Cardiovascular Disease

## 2019-05-29 NOTE — Telephone Encounter (Signed)
Bryon Lions, care manager for patient calling States that she was reviewing the list of recommendations we gave patient at last appointment and would like to discuss Would like to know if we could send orders/recommendations to [facility]  Please call at [phone]

## 2019-05-29 NOTE — Telephone Encounter (Signed)
Spoke with Gregary Signs and she inquired if we could submit the request in letter she received from patient to the [facility] department. Advised that I am not able to discuss patient information with her and that we do not send orders to [facility] and that typically patients PCP would be the one to handle that. She verbalized understanding with no further questions at this time.

## 2019-05-29 NOTE — Telephone Encounter (Signed)
Left voicemail message that I was returning call. No patient information left in that message.

## 2019-05-29 NOTE — Telephone Encounter (Signed)
Person that called earlier is not listed on her DPR. Patient at last visit did NOT give permission to release any information to anyone else. Attempted to call patient first to check with her about this person calling with request. Will let patient know that PCP would need to provide orders to Medicaid. There was no answer/voicemail box was full. Will attempt to call again later.

## 2019-06-04 ENCOUNTER — Other Ambulatory Visit: Payer: Self-pay | Admitting: Internal Medicine

## 2019-06-06 ENCOUNTER — Encounter: Payer: Self-pay | Admitting: *Deleted

## 2019-06-06 ENCOUNTER — Encounter: Payer: Self-pay | Admitting: Podiatry

## 2019-06-06 ENCOUNTER — Ambulatory Visit: Payer: Self-pay | Admitting: *Deleted

## 2019-06-06 ENCOUNTER — Ambulatory Visit (INDEPENDENT_AMBULATORY_CARE_PROVIDER_SITE_OTHER): Payer: Medicare Other | Admitting: Podiatry

## 2019-06-06 ENCOUNTER — Telehealth: Payer: Self-pay | Admitting: Internal Medicine

## 2019-06-06 ENCOUNTER — Other Ambulatory Visit: Payer: Self-pay

## 2019-06-06 ENCOUNTER — Ambulatory Visit (INDEPENDENT_AMBULATORY_CARE_PROVIDER_SITE_OTHER): Payer: Medicare Other

## 2019-06-06 DIAGNOSIS — M76822 Posterior tibial tendinitis, left leg: Secondary | ICD-10-CM

## 2019-06-06 DIAGNOSIS — M76821 Posterior tibial tendinitis, right leg: Secondary | ICD-10-CM

## 2019-06-06 DIAGNOSIS — M79676 Pain in unspecified toe(s): Secondary | ICD-10-CM | POA: Diagnosis not present

## 2019-06-06 DIAGNOSIS — B351 Tinea unguium: Secondary | ICD-10-CM | POA: Diagnosis not present

## 2019-06-06 NOTE — Telephone Encounter (Signed)
Pt called with complaints of a worsening facial droop that started several days ago; she is not sure when it started; the droop is on the left side; recommendations made per nurse triage protocol; she verbalized understanding, but refuses stating she is going to her appt podiatrist appt first, and then she will go to the hospital; explained to pt that these symptoms supercedes other appts, and she verbalizes understanding, and will go to the hospital after appt; the pt would also like for Dr Army Melia to order her an "adjustable bed with a full size frame and a gel mattress expediated"; will route to office for notification.  Reason for Disposition . [1] Weakness (i.e., paralysis, loss of muscle strength) of the face, arm / hand, or leg / foot on one side of the body AND [2] sudden onset AND [3] present now  Answer Assessment - Initial Assessment Questions 1. SYMPTOM: "What is the main symptom you are concerned about?" (e.g., weakness, numbness)     Left facial droop 2. ONSET: "When did this start?" (minutes, hours, days; while sleeping)    days 3. LAST NORMAL: "When was the last time you were normal (no symptoms)?"    Patient is not sure 4. PATTERN "Does this come and go, or has it been constant since it started?"  "Is it present now?"     Intermittently; especially when she is tired 5. CARDIAC SYMPTOMS: "Have you had any of the following symptoms: chest pain, difficulty breathing, palpitations?"     Chest pain and difficulty breathing 6. NEUROLOGIC SYMPTOMS: "Have you had any of the following symptoms: headache, dizziness, vision loss, double vision, changes in speech, unsteady on your feet?"      7. OTHER SYMPTOMS: "Do you have any other symptoms?"      8. PREGNANCY: "Is there any chance you are pregnant?" "When was your last menstrual period?"  n/a  Protocols used: NEUROLOGIC DEFICIT-A-AH

## 2019-06-06 NOTE — Telephone Encounter (Signed)
Called and spoke with Patient over the phone. She said she is currently in the car on the way to see another doctor. Informed her that after speaking to Dr Army Melia that we do not typically order beds for patients.  She said that's ridiculous that she would refuse to order her a bed. Explained that those who typically gets beds ordered are those who are handicapped and unable to move in and out of bed. She said " Do you know me?" Explained, that I have met her before. She said " Well, If Dr Army Melia is refusing then maybe this will not work out with her being my doctor." Then she went on stating that she has a giant hernia pressing up against her heart and she has spine issues, so she said " I May not look handicapped but I am."   She said is going to call the office back after her doctors appt to make an appointment with Dr. Army Melia to discuss this bed issue. She said her GP before prescribed one of these beds of these beds for her before. She said its simple, we just have to send in a prescription. She said she does not feel well, today and that she doesn't feel like discussing this right now. She said "I really like Dr Army Melia so I hope this works out and hope she will do this for me." She also said she was surprised that Dr. Army Melia declined to order her a bed because she claims she discussed her ordering the bed for her at her last visit and she said she would.   Pt said she will call back even after I explained we cannot order a bed for her.  CM

## 2019-06-06 NOTE — Telephone Encounter (Unsigned)
Copied from [place] [id number]. Topic: General - Other >> Jun 06, 2019  9:27 AM Leim Fabry wrote: Reason for CRM: Patient calling to discuss with Dr. Army Melia on getting a bed to assist with her heart condition. Dr. Hedy Camara has advised the patient to get this bed ASAP(doesn't not want to sleep close to the floor)

## 2019-06-06 NOTE — Telephone Encounter (Signed)
See other CRM from today's date. Spke with patient over the phone while she is in the car on the way to another doctor.  CM

## 2019-06-06 NOTE — Progress Notes (Unsigned)
Angela [name] completed her visit with Dr. Amalia Hailey.  I checked her out.  She informed me she wanted to come back and have her nails trimmed.  I informed her I would schedule her an appointment with Dr. Prudence Davidson.  She stated Dr. Amalia Hailey was her doctor.  I explained to her that Dr. Amalia Hailey does not do routine foot care.  She went and sat in the waiting area.  I printed her after visit summary and went to give her the form.  I informed her that her ride was waiting on her in the parking lot and that I would let her know that she was ready.  I proceeded to the door with her after visit summary in my hand and to signal to the driver letting her know that Angela [name] was ready.  Angela [name] stated bring me my paperwork do not give her my paperwork.  I took her the paperwork and informed her that I was not going to give the paperwork to the driver and that I was just letting her know you were ready.  I went back to the door and signaled to the driver that Angela [name] was ready.  The driver informed Cozell, who was cleaning the front lobby, and myself that Angela [name] said she was not riding with her.  She said Angela [name] stated she had a ride coming.  I told her I would ask Angela [name].  I approached Angela [name] to let her know that her ride was outside again.  She became belligerent and asked why we were trying to intimidate her because the driver, Cozell, Estill Bamberg, who was putting cultures in the lock box, and myself were in the same vicinity.  She stated I was trying to give her AVS to the driver like she was not competent.  I told her I was not going to give her AVS to the driver, I was just letting her know that you were ready to leave.  The patient then stated she has a right to deny services and they we should not be trying to get her to leave with the transportation lady.  I informed her that she was exactly right.  She demanded to see Dr. Amalia Hailey or Janace Hoard.  Angie tried to explain to her that we all had different reasons  for being in the waiting area.  She stated she would never come here again.   She said she had called [facility] and they had made arrangements for her to be picked up.  She left out of the office.   The transportation company called and asked if the patient was still here.  I informed her that she was.  I informed her that Angela [name] said she had made arrangements via [employer].  She stated [facility] had made the arrangements through her company.  I asked if they were going to send someone to get Angela [name] said they would have to find someone because the driver that brought her stated she had refused her help.  I went outside to check on Angela [name] and she was gone.

## 2019-06-09 NOTE — Progress Notes (Signed)
   SUBJECTIVE Patient presents to office today complaining of elongated, thickened nails that cause pain while ambulating in shoes. She is unable to trim her own nails.  She also reports bilateral ankle pain and swelling that began a few months ago. Walking increases the symptoms. She is requesting a prescription for new shoes. She has been using compression anklets bilaterally for treatment with some relief. Patient is here for further evaluation and treatment.  Past Medical History:  Diagnosis Date  . Agitation 09/18/2017  . Allergic rhinitis   . Altered mental status, unspecified 12/25/2018  . Anemia   . Blurred vision   . Chest pain of uncertain etiology [id number]  . Confusion state 12/25/2018  . Depression   . Diverticulosis   . Endometriosis   . Falls   . GERD (gastroesophageal reflux disease)   . Hernia, hiatal   . Hypothyroidism   . IBS (irritable bowel syndrome)   . Labile mood   . Malignant neoplasm of skin   . Migraine   . Neuropathy   . Noncompliance 12/25/2017  . PTSD (post-traumatic stress disorder)   . Thyroid disease     OBJECTIVE General Patient is awake, alert, and oriented x 3 and in no acute distress. Derm Skin is dry and supple bilateral. Negative open lesions or macerations. Remaining integument unremarkable. Nails are tender, long, thickened and dystrophic with subungual debris, consistent with onychomycosis, 1-5 bilateral. No signs of infection noted. Vasc  DP and PT pedal pulses palpable bilaterally. Temperature gradient within normal limits.  Neuro Epicritic and protective threshold sensation grossly intact bilaterally.  Musculoskeletal Exam Pain on palpation noted to the posterior tibial tendon of the bilateral feet. No symptomatic pedal deformities noted bilateral. Muscular strength within normal limits.  Radiographic Exam:  Normal osseous mineralization. Joint spaces preserved. No fracture or dislocation identified.    ASSESSMENT 1. Onychodystrophic  nails 1-5 bilateral with hyperkeratosis of nails.  2. Onychomycosis of nail due to dermatophyte bilateral 3. Posterior tibial tendinitis bilateral  4. Generalized foot pain bilateral   PLAN OF CARE 1. Patient evaluated today. X-Rays reviewed.  2. Instructed to maintain good pedal hygiene and foot care.  3. Mechanical debridement of nails 1-5 bilaterally performed using a nail nipper. Filed with dremel without incident.  4. Injection of 0.5 mLs Celestone Soluspan injected into the posterior tibial tendon sheath of the bilateral feet.  5. Continue using compression anklets bilaterally.  6. Prescription for new shoes and custom orthotics provided to patient.  7. Return to clinic as needed.    Edrick Kins, DPM [facility]  Dr. Edrick Kins, [address]                                        [place], [place] [address]                Office [phone]  Fax [phone]

## 2019-06-10 ENCOUNTER — Other Ambulatory Visit: Payer: Self-pay | Admitting: Obstetrics and Gynecology

## 2019-06-10 NOTE — Telephone Encounter (Signed)
Called and spoke to the pt. She wants to know if I spoke with Dr. Army Melia after our last discussion about the bed she is wanting. Informed her Yes, Dr Army Melia still cannot order her a bed. Beds are typically ordered for pts who are on hospice, OR who have severe congestive heart failure. She said she isn't wanting a hospital bed, just a adjustable bed frame. Explained- we still do not order bed frames.  She verbalized understanding and said okay. Asked for the eye doctor referral's name and number we sent a month or so ago. Gave her this information- she told me she had a cold and I told her I hope she feels better.  CM

## 2019-06-10 NOTE — Telephone Encounter (Signed)
Pt called and is requesting to speak with nurse. Pt states that she is confused about this encounter and would like to have someone call her back to discuss it. Please advise.

## 2019-06-12 ENCOUNTER — Other Ambulatory Visit: Payer: Self-pay | Admitting: Obstetrics and Gynecology

## 2019-06-12 ENCOUNTER — Other Ambulatory Visit: Payer: Self-pay | Admitting: Internal Medicine

## 2019-06-12 NOTE — Telephone Encounter (Signed)
Medication Refill - Medication: synthroid   Has the patient contacted their pharmacy? Yes.   Pt states that she is out of this medication and that she is no longer seeing the provider who originally prescribed this. Please advise.  (Agent: If no, request that the patient contact the pharmacy for the refill.) (Agent: If yes, when and what did the pharmacy advise?)  Preferred Pharmacy (with phone number or street name):  [place], [place] - [place]  [place] [place] [address]  Phone: [phone] Fax: [phone]  Not a 24 hour pharmacy; exact hours not known.     Agent: Please be advised that RX refills may take up to 3 business days. We ask that you follow-up with your pharmacy.

## 2019-06-12 NOTE — Telephone Encounter (Signed)
Requested medication (s) are due for refill today: Yes  Requested medication (s) are on the active medication list: Yes  Last refill:  12/27/18  Future visit scheduled: Yes  Notes to clinic: In request it states pt. Taking 100 mcg daily. Attempted to reach pt., but mailbox if full. Trying to verify what dose pt. Is on.    Requested Prescriptions  Pending Prescriptions Disp Refills   levothyroxine (SYNTHROID) 50 MCG tablet 30 tablet 0    Sig: Take 1 tablet (50 mcg total) by mouth daily at 6 (six) AM.      Endocrinology:  Hypothyroid Agents Failed - 06/12/2019  9:25 AM      Failed - TSH needs to be rechecked within 3 months after an abnormal result. Refill until TSH is due.      Passed - TSH in normal range and within 360 days    TSH  Date Value Ref Range Status  04/29/2019 3.450 0.450 - 4.500 uIU/mL Final          Passed - Valid encounter within last 12 months    Recent Outpatient Visits           1 month ago Benign essential HTN   [facility] Glean Hess, MD   1 year ago Acute non-recurrent pansinusitis   [place], NP   1 year ago COPD with acute exacerbation [facility])   [place], MD   1 year ago Chronic obstructive pulmonary disease, unspecified COPD type [facility])   [place], NP   1 year ago Erroneous encounter - disregard   [place], NP       Future Appointments             In 2 weeks Army Melia, Jesse Sans, MD [facility], [place]   In 8 months Rubie Maid, MD [facility]

## 2019-06-13 MED ORDER — LEVOTHYROXINE SODIUM 50 MCG PO TABS: 50.0000 ug | ORAL_TABLET | Freq: Every day | ORAL | 2 refills | Status: DC

## 2019-06-14 DIAGNOSIS — Z87891 Personal history of nicotine dependence: Secondary | ICD-10-CM | POA: Diagnosis not present

## 2019-06-14 DIAGNOSIS — M21611 Bunion of right foot: Secondary | ICD-10-CM | POA: Diagnosis not present

## 2019-06-14 DIAGNOSIS — M2011 Hallux valgus (acquired), right foot: Secondary | ICD-10-CM | POA: Diagnosis not present

## 2019-06-14 DIAGNOSIS — Z604 Social exclusion and rejection: Secondary | ICD-10-CM | POA: Diagnosis not present

## 2019-06-14 DIAGNOSIS — E039 Hypothyroidism, unspecified: Secondary | ICD-10-CM | POA: Diagnosis not present

## 2019-06-14 DIAGNOSIS — T50906A Underdosing of unspecified drugs, medicaments and biological substances, initial encounter: Secondary | ICD-10-CM | POA: Diagnosis not present

## 2019-06-14 DIAGNOSIS — R638 Other symptoms and signs concerning food and fluid intake: Secondary | ICD-10-CM | POA: Diagnosis not present

## 2019-06-14 DIAGNOSIS — Z91128 Patient's intentional underdosing of medication regimen for other reason: Secondary | ICD-10-CM | POA: Diagnosis not present

## 2019-06-14 DIAGNOSIS — R451 Restlessness and agitation: Secondary | ICD-10-CM | POA: Diagnosis not present

## 2019-06-16 DIAGNOSIS — R0981 Nasal congestion: Secondary | ICD-10-CM | POA: Diagnosis not present

## 2019-06-16 DIAGNOSIS — Z20822 Contact with and (suspected) exposure to covid-19: Secondary | ICD-10-CM | POA: Diagnosis not present

## 2019-06-17 ENCOUNTER — Telehealth: Payer: Self-pay | Admitting: Internal Medicine

## 2019-06-17 NOTE — Telephone Encounter (Signed)
Spoke to pt. She will call back to schedule Medicare Annual Wellness Visit (AWV) either virtually/audio only or in office. Whichever the patients preference is.  Last AWV 02/08/18; please schedule at anytime with [facility] Health Advisor.  This should be a 40 minute visit.

## 2019-06-18 ENCOUNTER — Other Ambulatory Visit: Payer: Self-pay | Admitting: *Deleted

## 2019-06-18 NOTE — Patient Outreach (Signed)
Member screened for potential [facility] needs as a benefit of [facility] Medicare ACO.  Per Patient Pearletha [place] member has had multiple ED visits/hospitalizations. Most recent ED/hospitalization dc from [facility] 06/15/19.  Telephone call made to member [phone]. No answer. Left HIPAA compliant voicemail message.   Noted member goes to Dr. Army Melia at [facility]. Dr. Army Melia has [facility] team. Will communicate with [facility] team about member's ED frequency.   Marthenia Rolling, MSN-Ed, RN,BSN [place] Acute Care Coordinator [phone] [facility]) [phone]  (Toll free office)

## 2019-06-19 ENCOUNTER — Ambulatory Visit: Payer: Self-pay | Admitting: General Practice

## 2019-06-19 ENCOUNTER — Telehealth: Payer: Self-pay | Admitting: Internal Medicine

## 2019-06-19 DIAGNOSIS — F3341 Major depressive disorder, recurrent, in partial remission: Secondary | ICD-10-CM

## 2019-06-19 DIAGNOSIS — F319 Bipolar disorder, unspecified: Secondary | ICD-10-CM

## 2019-06-19 DIAGNOSIS — K581 Irritable bowel syndrome with constipation: Secondary | ICD-10-CM

## 2019-06-19 DIAGNOSIS — F3164 Bipolar disorder, current episode mixed, severe, with psychotic features: Secondary | ICD-10-CM

## 2019-06-19 DIAGNOSIS — F4310 Post-traumatic stress disorder, unspecified: Secondary | ICD-10-CM

## 2019-06-19 DIAGNOSIS — Z79899 Other long term (current) drug therapy: Secondary | ICD-10-CM

## 2019-06-19 DIAGNOSIS — I10 Essential (primary) hypertension: Secondary | ICD-10-CM

## 2019-06-19 NOTE — Patient Instructions (Signed)
Visit Information  Goals Addressed            This Visit's Progress   . RNCM: Pt: "I just found my cuff, it has been packed up" (pt-stated)       CARE PLAN ENTRY (see longtitudinal plan of care for additional care plan information)  Current Barriers:  . Chronic Disease Management support, education, and care coordination needs related to HTN, Anxiety, Depression, Bipolar Disorder, and IBS  Clinical Goal(s) related to HTN, Anxiety, Depression, Bipolar Disorder, and IBS :  Over the next 120 days, patient will:  . Work with the care management team to address educational, disease management, and care coordination needs  . Begin or continue self health monitoring activities as directed today Measure and record blood pressure 1 times per week and adhere to a heart healthy diet . Call provider office for new or worsened signs and symptoms Blood pressure findings outside established parameters, Chest pain, Shortness of breath, and New or worsened symptom related to depression, anxiety, IBS and other chronic conditions . Call care management team with questions or concerns . Verbalize basic understanding of patient centered plan of care established today  Interventions related to HTN, Anxiety, Depression, Bipolar Disorder, and IBS :  . Evaluation of current treatment plans and patient's adherence to plan as established by provider . Assessed patient understanding of disease states.  The patient has multiple chronic conditions. States she understands her conditions but she is not always compliant with medication regimen. The patient feels she is not thriving in her current living environment and can't understand why she can't get help. Empathetic listening and support given.  . Assessed patient's education and care coordination needs.  The patient is upset because she has ask for a bed that will help "the large hernia" she has and the ability for her to be able to breath better. She does not  understand why she can not have a bed that will allow her to raise her head. Recommendation made to the patient to discuss with her pcp her concerns and ask pcp for recommendations.  . Provided disease specific education to patient.  Education given on dietary needs. The patient states she can not get boost or ensure. The patient has the OTC benefit through [facility]. The patient states she has not been able to use this to get boost because she has had to get other things she needs. Education on Boost and Ensure being OTC products and no prescription needed.   Nash Dimmer with appropriate clinical care team members regarding patient needs. Referrals place for CCM team involvement to help with the patients expressed needs.   Patient Self Care Activities related to HTN, Anxiety, Depression, Bipolar Disorder, and IBS :  . Patient is unable to independently self-manage chronic health conditions  Initial goal documentation     . RNCM: Pt:"I am not happy with my current living arrangements" (pt-stated)       [place] (see longitudinal plan of care for additional care plan information)  Current Barriers:  Marland Kitchen Knowledge Deficits related to how to get affordable housing to meet her needs. Is unhappy in current living arrangements  . Care Coordination needs related to help with housing options in a patient with HTN, bipolar disorder, PTSD, depression and other chronic conditions (disease states) . Lacks caregiver support.  . Film/video editor.  . Transportation barriers . Non-adherence to prescribed medication regimen . No Advanced Directives in place  Nurse Case Manager Clinical Goal(s):  .  Over the next 120 days, patient will verbalize understanding of plan for finding new housing and other expressed needs . Over the next 120 days, patient will work with RNCM, pcp and CCM team to address needs related to new housing and other SDOH needs . Over the next 120 days, patient will work with CM  clinical social worker to help with housing, depression, bipolar, ptsd and other expressed anxieties  . Over the next 120 days, patient will work with care guides  (community agency) to help with expressed needs of housing, food, transportation, and financial constraints.  Interventions:  . Inter-disciplinary care team collaboration (see longitudinal plan of care) . Evaluation of current treatment plan related to chronic conditions  and patient's adherence to plan as established by provider. The patient states she does not feel like she is thriving and is unhappy with her living arrangements. Has been in this place for 3 months and has not unpacked. States she has mold in her bedroom and it is causing her to have more breathing problems and acute sinusitis.  . Advised patient to call the provider for changes in condition or worsening s/s of depression and anxiety . Provided education to patient re: CCM team and resources to help with expressed needs for new housing, transportation, and food  . Reviewed medications with patient and discussed unable to do a complete the medication reconciliation. The patient verbalized that she does not always take her medications as prescribed. Will do a referral for pharmacy to see if pharmacy can help with simplification of medications and help with compliance.  Nash Dimmer with CCM team and pcp regarding patient feeling like she is not thriving in her current environment. The patient was tearful at times during the call. Denies suicidal ideation at this time.  . Discussed plans with patient for ongoing care management follow up and provided patient with direct contact information for care management team . Care Guide referral for housing, transportation, and food resources in the community . Social Work referral for ongoing support for complex needs related to depression, bipolar, anxiety, ptsd and other chronic conditions impacting  her care.   Patient Self Care  Activities:  . Patient verbalizes understanding of plan to work with CCM team to meet health and wellness needs . Self administers medications as prescribed . Attends all scheduled provider appointments . Performs ADL's independently . Performs IADL's independently . Calls provider office for new concerns or questions . Unable to independently manage her health and wellbeing- feels like she is not thriving in her living environment . Does not adhere to prescribed medication regimen . Lacks social connections  Initial goal documentation        Patient verbalizes understanding of instructions provided today.   The care management team will reach out to the patient again over the next 30 to 60 days.   Noreene Larsson RN, MSN, [place] [facility] Mobile: [phone]

## 2019-06-19 NOTE — Telephone Encounter (Signed)
  Community Resource Referral   [facility] 06/19/2019  1st Attempt - No voicemail box set up  DOB: Jan 02, 1951   AGE: 69 y.o.   GENDER: female   PCP Glean Hess, MD.   Called pt regarding Community Resource Referral voicemail box not set up Follow up on: 06/20/2019 [place] . [email or web address]  [phone]

## 2019-06-19 NOTE — Chronic Care Management (AMB) (Signed)
Chronic Care Management   Follow Up Note   06/19/2019 Name: Angela [name] MRN: [id number] DOB: 1950-11-13  Referred by: Glean Hess, MD Reason for referral : Chronic Care Management (Initial: RNCM Chronic Disease Management and Care Coordination Needs)   Angela [name] is a 69 y.o. year old female who is a primary care patient of Army Melia Jesse Sans, MD. The CCM team was consulted for assistance with chronic disease management and care coordination needs.    Review of patient status, including review of consultants reports, relevant laboratory and other test results, and collaboration with appropriate care team members and the patient's provider was performed as part of comprehensive patient evaluation and provision of chronic care management services.    SDOH (Social Determinants of Health) assessments performed: Yes See Care Plan activities for detailed interventions related to SDOH)  SDOH Interventions     Most Recent Value  SDOH Interventions  Food Insecurity Interventions  Other (Comment) [Referral to care guide]  Housing Interventions  Other (Comment) [referral to care guides for housing service]  Physical Activity Interventions  Other (Comments) [does not do any structured activity]  Stress Interventions  Provide Counseling, Other (Comment) [the patient is very unhappy in her current living situation and state of health]  Social Connections Interventions  Other (Comment) [care guide referral]  Transportation Interventions  Other (Comment) [care guide referral]  Alcohol Brief Interventions/Follow-up  AUDIT Score <7 follow-up not indicated  Depression Interventions/Treatment   Counseling [LCSW]       Outpatient Encounter Medications as of 06/19/2019  Medication Sig  . acetaminophen (TYLENOL) 650 MG CR tablet Take 650 mg by mouth every 8 (eight) hours as needed for pain.  . Acetylcysteine (NAC) 500 MG CAPS Take 500 mg by mouth 2 (two) times a day.  . albuterol  (VENTOLIN HFA) 108 (90 Base) MCG/ACT inhaler Inhale 2 puffs into the lungs every 4 (four) hours as needed for wheezing or shortness of breath.  . Amino Acids-Protein Hydrolys (FEEDING SUPPLEMENT, PRO-STAT 64,) LIQD Take 30 mLs by mouth daily.  Marland Kitchen ammonium lactate (AMLACTIN) 12 % cream Apply topically 2 (two) times daily.  Marland Kitchen atorvastatin (LIPITOR) 20 MG tablet Take 1 tablet (20 mg total) by mouth at bedtime.  . Biotin 10000 MCG TABS Take 10,000 mcg by mouth daily.   . Calcium Citrate (CITRACAL PO) Take 800 mg by mouth. Takes 2 (400mg ) tablets BID  . Cholecalciferol 100 MCG (4000 UT) CAPS Take 4,000 capsules by mouth daily.  . clobetasol (TEMOVATE) 0.05 % external solution Apply topically as directed. Qd to aa itchy rash until clear, avoid face, groin, axilla  . Cyanocobalamin (VITAMIN B-12) 1000 MCG SUBL Place 1,000 mcg under the tongue.   . diclofenac sodium (VOLTAREN) 1 % GEL Apply small amount (2 grams) topically over knees or hands up to four times a day if needed for arthritis  . famotidine (PEPCID) 20 MG tablet Take 1 tablet (20 mg total) by mouth 2 (two) times daily as needed for heartburn or indigestion.  . ferrous sulfate 325 (65 FE) MG tablet Take 1 tablet (325 mg total) by mouth daily with breakfast.  . fluconazole (DIFLUCAN) 200 MG tablet Take 200 mg by mouth daily.  . hydrOXYzine (ATARAX/VISTARIL) 10 MG tablet TAKE 1 TABLET BY MOUTH EVERY 8 HOURS AS NEEDED  . Icosapent Ethyl (VASCEPA) 1 g CAPS TAKE 2 CAPSULES BY MOUTH TWICE DAILY (TO HELP WITH TRIGLYCERIDES)  . ketoconazole (NIZORAL) 2 % cream   . Lactulose 20 GM/30ML SOLN  30 ml every 4 hours until constipation is relieved  . levalbuterol (XOPENEX) 0.63 MG/3ML nebulizer solution Take 3 mLs (0.63 mg total) by nebulization every 4 (four) hours as needed for wheezing or shortness of breath.  . levothyroxine (SYNTHROID) 50 MCG tablet Take 1 tablet (50 mcg total) by mouth daily at 6 (six) AM.  . linaclotide (LINZESS) 290 MCG CAPS capsule  Take 1 capsule (290 mcg total) by mouth daily before breakfast.  . LORazepam (ATIVAN) 0.5 MG tablet TAKE 1 TABLET BY MOUTH AS NEEDED ONCE DAILY  . losartan (COZAAR) 50 MG tablet Take 2 tablets (100 mg total) by mouth daily.  . Magnesium 500 MG TABS Take 500 mg by mouth every other day.  . mupirocin ointment (BACTROBAN) 2 % Apply 1 application topically 3 (three) times daily.  Marland Kitchen PARoxetine (PAXIL) 20 MG tablet Take 20 mg by mouth daily.  . prednisoLONE acetate (PRED FORTE) 1 % ophthalmic suspension INSTILL 1 DROP INTO EACH EYE TWICE DAILY  . pregabalin (LYRICA) 75 MG capsule Take 75 mg by mouth 2 (two) times daily.   Marland Kitchen PREMARIN vaginal cream PLACE 1 APPLICATORFUL VAGINALLY TWO TIMES A WEEK.  . promethazine (PHENERGAN) 12.5 MG tablet Take 12.5 mg by mouth every 6 (six) hours as needed for nausea or vomiting.  . pyridoxine (B-6) 100 MG tablet Take 1 tablet (100 mg total) by mouth 2 (two) times daily.  Marland Kitchen tiZANidine (ZANAFLEX) 2 MG tablet Take by mouth every 6 (six) hours as needed for muscle spasms.  Marland Kitchen triamcinolone cream (KENALOG) 0.1 %   . triamcinolone ointment (KENALOG) 0.5 % Apply 1 application topically 2 (two) times daily.  Marland Kitchen umeclidinium bromide (INCRUSE ELLIPTA) 62.5 MCG/INH AEPB Inhale 1 puff into the lungs daily.  . Vitamin D, Ergocalciferol, (DRISDOL) 1.25 MG (50000 UNIT) CAPS capsule Take 1 capsule (50,000 Units total) by mouth every 7 (seven) days.  Marland Kitchen XIIDRA 5 % SOLN INSTILL 1 DROP INTO EACH EYE TWICE DAILY  . [DISCONTINUED] traZODone (DESYREL) 50 MG tablet Take 1 tablet (50 mg total) by mouth at bedtime as needed. Pt takes 1/2 tablet qhs   No facility-administered encounter medications on file as of 06/19/2019.     Objective:  BP Readings from Last 3 Encounters:  05/26/19 132/82  04/29/19 (!) 150/80  04/23/19 (!) 147/77    Goals Addressed            This Visit's Progress   . RNCM: Pt: "I just found my cuff, it has been packed up" (pt-stated)       CARE PLAN ENTRY (see  longtitudinal plan of care for additional care plan information)  Current Barriers:  . Chronic Disease Management support, education, and care coordination needs related to HTN, Anxiety, Depression, Bipolar Disorder, and IBS  Clinical Goal(s) related to HTN, Anxiety, Depression, Bipolar Disorder, and IBS :  Over the next 120 days, patient will:  . Work with the care management team to address educational, disease management, and care coordination needs  . Begin or continue self health monitoring activities as directed today Measure and record blood pressure 1 times per week and adhere to a heart healthy diet . Call provider office for new or worsened signs and symptoms Blood pressure findings outside established parameters, Chest pain, Shortness of breath, and New or worsened symptom related to depression, anxiety, IBS and other chronic conditions . Call care management team with questions or concerns . Verbalize basic understanding of patient centered plan of care established today  Interventions related to  HTN, Anxiety, Depression, Bipolar Disorder, and IBS :  . Evaluation of current treatment plans and patient's adherence to plan as established by provider . Assessed patient understanding of disease states.  The patient has multiple chronic conditions. States she understands her conditions but she is not always compliant with medication regimen. The patient feels she is not thriving in her current living environment and can't understand why she can't get help. Empathetic listening and support given.  . Assessed patient's education and care coordination needs.  The patient is upset because she has ask for a bed that will help "the large hernia" she has and the ability for her to be able to breath better. She does not understand why she can not have a bed that will allow her to raise her head. Recommendation made to the patient to discuss with her pcp her concerns and ask pcp for recommendations.    . Provided disease specific education to patient.  Education given on dietary needs. The patient states she can not get boost or ensure. The patient has the OTC benefit through [facility]. The patient states she has not been able to use this to get boost because she has had to get other things she needs. Education on Boost and Ensure being OTC products and no prescription needed.   Nash Dimmer with appropriate clinical care team members regarding patient needs. Referrals place for CCM team involvement to help with the patients expressed needs.   Patient Self Care Activities related to HTN, Anxiety, Depression, Bipolar Disorder, and IBS :  . Patient is unable to independently self-manage chronic health conditions  Initial goal documentation     . RNCM: Pt:"I am not happy with my current living arrangements" (pt-stated)       Sharon (see longitudinal plan of care for additional care plan information)  Current Barriers:  Marland Kitchen Knowledge Deficits related to how to get affordable housing to meet her needs. Is unhappy in current living arrangements  . Care Coordination needs related to help with housing options in a patient with HTN, bipolar disorder, PTSD, depression and other chronic conditions (disease states) . Lacks caregiver support.  . Film/video editor.  . Transportation barriers . Non-adherence to prescribed medication regimen . No Advanced Directives in place  Nurse Case Manager Clinical Goal(s):  Marland Kitchen Over the next 120 days, patient will verbalize understanding of plan for finding new housing and other expressed needs . Over the next 120 days, patient will work with RNCM, pcp and CCM team to address needs related to new housing and other SDOH needs . Over the next 120 days, patient will work with CM clinical social worker to help with housing, depression, bipolar, ptsd and other expressed anxieties  . Over the next 120 days, patient will work with care guides  (community agency) to  help with expressed needs of housing, food, transportation, and financial constraints.  Interventions:  . Inter-disciplinary care team collaboration (see longitudinal plan of care) . Evaluation of current treatment plan related to chronic conditions  and patient's adherence to plan as established by provider. The patient states she does not feel like she is thriving and is unhappy with her living arrangements. Has been in this place for 3 months and has not unpacked. States she has mold in her bedroom and it is causing her to have more breathing problems and acute sinusitis.  . Advised patient to call the provider for changes in condition or worsening s/s of depression and anxiety . Provided  education to patient re: CCM team and resources to help with expressed needs for new housing, transportation, and food  . Reviewed medications with patient and discussed unable to do a complete the medication reconciliation. The patient verbalized that she does not always take her medications as prescribed. Will do a referral for pharmacy to see if pharmacy can help with simplification of medications and help with compliance.  Nash Dimmer with CCM team and pcp regarding patient feeling like she is not thriving in her current environment. The patient was tearful at times during the call. Denies suicidal ideation at this time.  . Discussed plans with patient for ongoing care management follow up and provided patient with direct contact information for care management team . Care Guide referral for housing, transportation, and food resources in the community . Social Work referral for ongoing support for complex needs related to depression, bipolar, anxiety, ptsd and other chronic conditions impacting  her care.   Patient Self Care Activities:  . Patient verbalizes understanding of plan to work with CCM team to meet health and wellness needs . Self administers medications as prescribed . Attends all scheduled  provider appointments . Performs ADL's independently . Performs IADL's independently . Calls provider office for new concerns or questions . Unable to independently manage her health and wellbeing- feels like she is not thriving in her living environment . Does not adhere to prescribed medication regimen . Lacks social connections  Initial goal documentation         Plan:   The care management team will reach out to the patient again over the next 30 to 60 days.    Noreene Larsson RN, MSN, [facility] Mobile: [phone]

## 2019-06-20 DIAGNOSIS — R52 Pain, unspecified: Secondary | ICD-10-CM | POA: Diagnosis not present

## 2019-06-20 DIAGNOSIS — Z20822 Contact with and (suspected) exposure to covid-19: Secondary | ICD-10-CM | POA: Diagnosis not present

## 2019-06-20 DIAGNOSIS — I10 Essential (primary) hypertension: Secondary | ICD-10-CM | POA: Diagnosis not present

## 2019-06-20 DIAGNOSIS — R1012 Left upper quadrant pain: Secondary | ICD-10-CM | POA: Diagnosis not present

## 2019-06-20 DIAGNOSIS — J9811 Atelectasis: Secondary | ICD-10-CM | POA: Diagnosis not present

## 2019-06-20 DIAGNOSIS — M810 Age-related osteoporosis without current pathological fracture: Secondary | ICD-10-CM | POA: Diagnosis not present

## 2019-06-20 DIAGNOSIS — K589 Irritable bowel syndrome without diarrhea: Secondary | ICD-10-CM | POA: Diagnosis not present

## 2019-06-20 DIAGNOSIS — Z6824 Body mass index (BMI) 24.0-24.9, adult: Secondary | ICD-10-CM | POA: Diagnosis not present

## 2019-06-20 DIAGNOSIS — R110 Nausea: Secondary | ICD-10-CM | POA: Diagnosis not present

## 2019-06-20 DIAGNOSIS — I700 Atherosclerosis of aorta: Secondary | ICD-10-CM | POA: Diagnosis not present

## 2019-06-20 DIAGNOSIS — Z743 Need for continuous supervision: Secondary | ICD-10-CM | POA: Diagnosis not present

## 2019-06-20 DIAGNOSIS — K219 Gastro-esophageal reflux disease without esophagitis: Secondary | ICD-10-CM | POA: Diagnosis not present

## 2019-06-20 DIAGNOSIS — Z87891 Personal history of nicotine dependence: Secondary | ICD-10-CM | POA: Diagnosis not present

## 2019-06-20 DIAGNOSIS — K449 Diaphragmatic hernia without obstruction or gangrene: Secondary | ICD-10-CM | POA: Diagnosis not present

## 2019-06-20 DIAGNOSIS — J329 Chronic sinusitis, unspecified: Secondary | ICD-10-CM | POA: Diagnosis not present

## 2019-06-20 DIAGNOSIS — Z79899 Other long term (current) drug therapy: Secondary | ICD-10-CM | POA: Diagnosis not present

## 2019-06-20 DIAGNOSIS — E440 Moderate protein-calorie malnutrition: Secondary | ICD-10-CM | POA: Diagnosis not present

## 2019-06-20 DIAGNOSIS — H9201 Otalgia, right ear: Secondary | ICD-10-CM | POA: Diagnosis not present

## 2019-06-20 DIAGNOSIS — R1010 Upper abdominal pain, unspecified: Secondary | ICD-10-CM | POA: Diagnosis not present

## 2019-06-20 DIAGNOSIS — M62838 Other muscle spasm: Secondary | ICD-10-CM | POA: Diagnosis not present

## 2019-06-20 DIAGNOSIS — R0789 Other chest pain: Secondary | ICD-10-CM | POA: Diagnosis not present

## 2019-06-20 DIAGNOSIS — Z8719 Personal history of other diseases of the digestive system: Secondary | ICD-10-CM | POA: Diagnosis not present

## 2019-06-20 DIAGNOSIS — E039 Hypothyroidism, unspecified: Secondary | ICD-10-CM | POA: Diagnosis not present

## 2019-06-20 LAB — LIPID PANEL
Cholesterol: 179 (ref 0–200)
HDL: 54 (ref 35–70)
LDL Cholesterol: 107
Triglycerides: 74 (ref 40–160)

## 2019-06-20 LAB — CBC AND DIFFERENTIAL
Hemoglobin: 40 — AB (ref 12.0–16.0)
Platelets: 285 (ref 150–399)
WBC: 6

## 2019-06-20 LAB — BASIC METABOLIC PANEL
BUN: 13 (ref 4–21)
Creatinine: 0.7 (ref 0.5–1.1)
Glucose: 81

## 2019-06-20 LAB — HEMOGLOBIN A1C: Hemoglobin A1C: 6.1

## 2019-06-21 DIAGNOSIS — R079 Chest pain, unspecified: Secondary | ICD-10-CM | POA: Diagnosis not present

## 2019-06-21 DIAGNOSIS — R1010 Upper abdominal pain, unspecified: Secondary | ICD-10-CM | POA: Diagnosis not present

## 2019-06-21 DIAGNOSIS — I10 Essential (primary) hypertension: Secondary | ICD-10-CM | POA: Diagnosis not present

## 2019-06-21 DIAGNOSIS — R109 Unspecified abdominal pain: Secondary | ICD-10-CM | POA: Diagnosis not present

## 2019-06-21 DIAGNOSIS — R0789 Other chest pain: Secondary | ICD-10-CM | POA: Diagnosis not present

## 2019-06-22 DIAGNOSIS — R0789 Other chest pain: Secondary | ICD-10-CM | POA: Diagnosis not present

## 2019-06-22 DIAGNOSIS — I10 Essential (primary) hypertension: Secondary | ICD-10-CM | POA: Diagnosis not present

## 2019-06-22 DIAGNOSIS — R109 Unspecified abdominal pain: Secondary | ICD-10-CM | POA: Diagnosis not present

## 2019-06-22 DIAGNOSIS — R1010 Upper abdominal pain, unspecified: Secondary | ICD-10-CM | POA: Diagnosis not present

## 2019-06-23 DIAGNOSIS — J42 Unspecified chronic bronchitis: Secondary | ICD-10-CM | POA: Diagnosis not present

## 2019-06-23 DIAGNOSIS — R0789 Other chest pain: Secondary | ICD-10-CM | POA: Diagnosis not present

## 2019-06-23 DIAGNOSIS — E039 Hypothyroidism, unspecified: Secondary | ICD-10-CM | POA: Diagnosis not present

## 2019-06-23 DIAGNOSIS — I10 Essential (primary) hypertension: Secondary | ICD-10-CM | POA: Diagnosis not present

## 2019-06-23 DIAGNOSIS — J329 Chronic sinusitis, unspecified: Secondary | ICD-10-CM | POA: Diagnosis not present

## 2019-06-23 LAB — COMPREHENSIVE METABOLIC PANEL: Calcium: 9.9 (ref 8.7–10.7)

## 2019-06-23 LAB — BASIC METABOLIC PANEL
BUN: 13 (ref 4–21)
CO2: 26 — AB (ref 13–22)
Chloride: 103 (ref 99–108)
Creatinine: 0.8 (ref 0.5–1.1)
Glucose: 93
Potassium: 4 (ref 3.4–5.3)
Sodium: 136 — AB (ref 137–147)

## 2019-06-23 MED ORDER — HYDROXYZINE HCL 10 MG PO TABS
10.00 | ORAL_TABLET | ORAL | Status: DC
Start: ? — End: 2019-06-23

## 2019-06-23 MED ORDER — LOSARTAN POTASSIUM 50 MG PO TABS
100.00 | ORAL_TABLET | ORAL | Status: DC
Start: 2019-06-23 — End: 2019-06-23

## 2019-06-23 MED ORDER — TIOTROPIUM BROMIDE MONOHYDRATE 18 MCG IN CAPS
18.00 | ORAL_CAPSULE | RESPIRATORY_TRACT | Status: DC
Start: 2019-06-23 — End: 2019-06-23

## 2019-06-23 MED ORDER — ASCORBIC ACID 500 MG PO TABS
500.00 | ORAL_TABLET | ORAL | Status: DC
Start: 2019-06-23 — End: 2019-06-23

## 2019-06-23 MED ORDER — FAMOTIDINE 20 MG PO TABS
20.00 | ORAL_TABLET | ORAL | Status: DC
Start: 2019-06-23 — End: 2019-06-23

## 2019-06-23 MED ORDER — TIZANIDINE HCL 4 MG PO TABS
4.00 | ORAL_TABLET | ORAL | Status: DC
Start: ? — End: 2019-06-23

## 2019-06-23 MED ORDER — FERROUS SULFATE 325 (65 FE) MG PO TBEC
325.00 | DELAYED_RELEASE_TABLET | ORAL | Status: DC
Start: 2019-06-23 — End: 2019-06-23

## 2019-06-23 MED ORDER — ATORVASTATIN CALCIUM 20 MG PO TABS
20.00 | ORAL_TABLET | ORAL | Status: DC
Start: 2019-06-23 — End: 2019-06-23

## 2019-06-23 MED ORDER — LINACLOTIDE 145 MCG PO CAPS
290.00 | ORAL_CAPSULE | ORAL | Status: DC
Start: 2019-06-24 — End: 2019-06-23

## 2019-06-23 MED ORDER — LACTULOSE 10 GM/15ML PO SOLN
30.00 | ORAL | Status: DC
Start: ? — End: 2019-06-23

## 2019-06-23 MED ORDER — CALCIUM CITRATE-VITAMIN D 315-250 MG-UNIT PO TABS
2.00 | ORAL_TABLET | ORAL | Status: DC
Start: 2019-06-23 — End: 2019-06-23

## 2019-06-23 MED ORDER — LIDOCAINE 5 % EX PTCH
1.00 | MEDICATED_PATCH | CUTANEOUS | Status: DC
Start: ? — End: 2019-06-23

## 2019-06-23 MED ORDER — SIMETHICONE 80 MG PO CHEW
80.00 | CHEWABLE_TABLET | ORAL | Status: DC
Start: ? — End: 2019-06-23

## 2019-06-23 MED ORDER — LORAZEPAM 0.5 MG PO TABS
0.50 | ORAL_TABLET | ORAL | Status: DC
Start: ? — End: 2019-06-23

## 2019-06-23 MED ORDER — ENOXAPARIN SODIUM 40 MG/0.4ML ~~LOC~~ SOLN
40.00 | SUBCUTANEOUS | Status: DC
Start: 2019-06-23 — End: 2019-06-23

## 2019-06-23 MED ORDER — PAROXETINE HCL 20 MG PO TABS
20.00 | ORAL_TABLET | ORAL | Status: DC
Start: 2019-06-23 — End: 2019-06-23

## 2019-06-23 MED ORDER — DICLOFENAC SODIUM 1 % EX GEL
2.00 | CUTANEOUS | Status: DC
Start: 2019-06-23 — End: 2019-06-23

## 2019-06-23 MED ORDER — ACETAMINOPHEN 325 MG PO TABS
650.00 | ORAL_TABLET | ORAL | Status: DC
Start: ? — End: 2019-06-23

## 2019-06-23 MED ORDER — LEVOTHYROXINE SODIUM 50 MCG PO TABS
50.00 | ORAL_TABLET | ORAL | Status: DC
Start: 2019-06-24 — End: 2019-06-23

## 2019-06-23 MED ORDER — SALINE NASAL SPRAY 0.65 % NA SOLN
1.00 | NASAL | Status: DC
Start: ? — End: 2019-06-23

## 2019-06-23 MED ORDER — PROMETHAZINE HCL 12.5 MG PO TABS
12.50 | ORAL_TABLET | ORAL | Status: DC
Start: ? — End: 2019-06-23

## 2019-06-23 NOTE — Telephone Encounter (Signed)
Email to pt   From: Jill Alexanders [facility])  Sent: Monday, June 23, 2019 4:15 PM To: [email or web address] Subject: Secure: [facility]  Good Afternoon [name], Thank you for speaking with me today regarding community resources for [facility], please let me know if you need anything further  I have attached the information regarding [place] program for seniors. What is Dial-A-Ride? Dial-A-Ride is a transportation service for people 71 years of age or older. Call the [place] at [phone] for information on eligibility, fare assistance, rides provided on a donation basis and other support made possible through federal and state grants. - Please see attached list of food banks and the meal calendar through [place] and [employer] as part of your insurance benefit. - [employer] in [place].   [place] . [email or web address]  [phone]

## 2019-06-23 NOTE — Telephone Encounter (Signed)
  Community Resource Referral   [place] 06/23/2019  DOB: 07-Mar-1950   AGE: 69 y.o.   GENDER: female   PCP Glean Hess, MD.   Called pt she was still in the hospital and stated that she would be working with social worker assigned to her. Asked that I email her the resources that I had available so that she could review with the case worker.  [place] . [email or web address]  [phone]

## 2019-06-24 ENCOUNTER — Telehealth: Payer: Self-pay | Admitting: Cardiovascular Disease

## 2019-06-24 ENCOUNTER — Telehealth: Payer: Self-pay | Admitting: Internal Medicine

## 2019-06-24 DIAGNOSIS — J42 Unspecified chronic bronchitis: Secondary | ICD-10-CM | POA: Diagnosis not present

## 2019-06-24 DIAGNOSIS — J329 Chronic sinusitis, unspecified: Secondary | ICD-10-CM | POA: Diagnosis not present

## 2019-06-24 DIAGNOSIS — R0789 Other chest pain: Secondary | ICD-10-CM | POA: Diagnosis not present

## 2019-06-24 DIAGNOSIS — E039 Hypothyroidism, unspecified: Secondary | ICD-10-CM | POA: Diagnosis not present

## 2019-06-24 NOTE — Telephone Encounter (Signed)
Patient would like a call back to discuss her living circumstances after her D/C from the hospital. Patient is currently admitted.

## 2019-06-24 NOTE — Telephone Encounter (Signed)
Spoke with patient and she is currently admitted to [facility] and is being discharged. She reports that Dr. Army Melia refused to order furniture or hospital bed. She then wanted Dr. Rockey Situ to please order rehab for 2 weeks before she is discharged. Recommended that she speak with her discharging physician and/or social work to discuss her requests and/or concerns. She did call and schedule appointment with Dr. Rockey Situ for tomorrow to see if he will listen to her and help in anyway possible. I did review that most of what she needs are services that her primary care provider should handle. Given that she does not have any active cardiac problems I did talk with her about our limitations to help her with some of the requests that she has in regards to rehab, hospital bed, and other equipment. She persisted on seeing Dr. Rockey Situ stating that as in immigrant he listens to her and understands her situation. She then requested that I pray for her and before we hung up I expressed the importance of talking with her discharge physician, social worker, and/or case manager to see if they can assist. She verbalized understanding with no further questions at this time.

## 2019-06-24 NOTE — Telephone Encounter (Unsigned)
Copied from [place] [id number]. Topic: General - Other >> Jun 24, 2019 11:57 AM Celene Kras wrote: Reason for CRM: Pt called stating that she is needing to have PCP give her a call back. Pt states that she is in the hospital and that it is urgent, but when asked to give further details pt stated it was too complicated. Please advise .

## 2019-06-25 ENCOUNTER — Ambulatory Visit: Payer: Medicare Other | Admitting: Cardiovascular Disease

## 2019-06-25 DIAGNOSIS — J329 Chronic sinusitis, unspecified: Secondary | ICD-10-CM | POA: Diagnosis not present

## 2019-06-25 DIAGNOSIS — R0789 Other chest pain: Secondary | ICD-10-CM | POA: Diagnosis not present

## 2019-06-25 DIAGNOSIS — E039 Hypothyroidism, unspecified: Secondary | ICD-10-CM | POA: Diagnosis not present

## 2019-06-25 DIAGNOSIS — J42 Unspecified chronic bronchitis: Secondary | ICD-10-CM | POA: Diagnosis not present

## 2019-06-25 NOTE — Telephone Encounter (Signed)
Called pt VM was full could not leave a message.  KP

## 2019-07-01 ENCOUNTER — Telehealth: Payer: Self-pay | Admitting: *Deleted

## 2019-07-01 NOTE — Telephone Encounter (Signed)
Error

## 2019-07-02 ENCOUNTER — Encounter: Payer: Self-pay | Admitting: Cardiovascular Disease

## 2019-07-02 ENCOUNTER — Encounter: Payer: Medicare Other | Admitting: Internal Medicine

## 2019-07-02 ENCOUNTER — Telehealth: Payer: Self-pay | Admitting: Internal Medicine

## 2019-07-02 ENCOUNTER — Telehealth (INDEPENDENT_AMBULATORY_CARE_PROVIDER_SITE_OTHER): Payer: Medicare Other | Admitting: Cardiovascular Disease

## 2019-07-02 ENCOUNTER — Telehealth: Payer: Self-pay

## 2019-07-02 ENCOUNTER — Other Ambulatory Visit: Payer: Self-pay

## 2019-07-02 ENCOUNTER — Encounter: Payer: Self-pay | Admitting: Internal Medicine

## 2019-07-02 ENCOUNTER — Telehealth: Payer: Self-pay | Admitting: Cardiovascular Disease

## 2019-07-02 VITALS — Ht 63.0 in | Wt 143.0 lb

## 2019-07-02 DIAGNOSIS — I10 Essential (primary) hypertension: Secondary | ICD-10-CM | POA: Diagnosis not present

## 2019-07-02 DIAGNOSIS — K449 Diaphragmatic hernia without obstruction or gangrene: Secondary | ICD-10-CM | POA: Diagnosis not present

## 2019-07-02 DIAGNOSIS — E782 Mixed hyperlipidemia: Secondary | ICD-10-CM | POA: Insufficient documentation

## 2019-07-02 DIAGNOSIS — F319 Bipolar disorder, unspecified: Secondary | ICD-10-CM

## 2019-07-02 DIAGNOSIS — I700 Atherosclerosis of aorta: Secondary | ICD-10-CM | POA: Diagnosis not present

## 2019-07-02 NOTE — Telephone Encounter (Signed)
Called pt to reschedule appt, said [place]. Pt wants to know why she cant have a VV, was in the hospital and needs to talk to dr berglund about further testing. Insisted in not being able to come in and wanting visit done over the phone. Said she wanted to talk to her dr and I did let her know dr berglund was busy with other patients and she insisited she had time since she had her slot open since she did not show up and didn't see why she just couldn't talk to the dr. Did not reschedule her appt and expecting a call back.

## 2019-07-02 NOTE — Patient Instructions (Addendum)
No follow up visit needed  Medication Instructions:  No changes  If you need a refill on your cardiac medications before your next appointment, please call your pharmacy.    Lab work: No new labs needed   If you have labs (blood work) drawn today and your tests are completely normal, you will receive your results only by: Marland Kitchen MyChart Message (if you have MyChart) OR . A paper copy in the mail If you have any lab test that is abnormal or we need to change your treatment, we will call you to review the results.   Testing/Procedures: No new testing needed   Follow-Up: At [facility], you and your health needs are our priority.  As part of our continuing mission to provide you with exceptional heart care, we have created designated Provider Care Teams.  These Care Teams include your primary Cardiologist (physician) and Advanced Practice Providers (APPs -  Physician Assistants and Nurse Practitioners) who all work together to provide you with the care you need, when you need it.  . You will need a follow up appointment as needed   . Providers on your designated Care Team:   . Murray Hodgkins, NP . Christell Faith, PA-C . Marrianne Mood, PA-C  Any Other Special Instructions Will Be Listed Below (If Applicable).  For educational health videos Log in to : [email or web address] Or : [email or web address], password : [email or web address]

## 2019-07-02 NOTE — Telephone Encounter (Signed)
Patient calling to speak with pam or Dr. Rockey Situ immediately.    Per patient Dr. Thornton Dales office is refusing care.  Please call to discuss .  Also see note from pcp with details on discussion.

## 2019-07-02 NOTE — Telephone Encounter (Signed)
Called and spoke with pt because she requested a call back from our clinic asking why we will not talk to her as a VV instead of seeing her in person. She told the receptionist that we had time to call her since we "were not doing anything for 20 mins" since she did not show up. Explained to pt that we seen other patients since she no showed and Dr Army Melia is still in the room with a patient. She said she is concerned that the receptionist said this because she did not say these things.   Informed her that its our office policy that if you do NOT have a fever then you need to be seen in person. Especially, since she is wanting her cholesterol labs checked. Told her we cannot order labs without seeing her in person. She said she feels Dr Army Melia is ignoring her, and her requests and not following her cardiologist recommendations.  Explained to her that we still need to see her in person and I would be glad to reschedule her appt at this time.   She then started bringing up how our office has " a lot of policies that do not work for me." She said she is sleeping on a " air mattress with a huge hernia pressing on my heart" because Dr. Army Melia said she does not order "Durable Medical Equipment, and would not help her get a bed. She would not give me a chance to speak, and continue to go on about the things our office does that she doesn't like so I told her to please hold for a moment so that she can speak with our office manager about her concerns.  Placed patient on a brief hold and went to get the office manager to discuss concerns with the patient and why we cannot do a VV instead of in office.  When the office manager and I returned to my phone the patient had hung up.  CM

## 2019-07-02 NOTE — Progress Notes (Deleted)
Date:  07/02/2019   Name:  Angela [name]   DOB:  1950-02-02   MRN:  [id number]   Chief Complaint: No chief complaint on file. GI appt with Dr. Denice Paradise on 07/09/19 Pulmonary appt 08/05/19 Dr. Regan Lemming ENT appt 07/08/19 Dr. Harrington Challenger  Hyperlipidemia This is a chronic problem. The problem is controlled. Current antihyperlipidemic treatment includes statins (and Vascepa). The current treatment provides significant improvement of lipids.  Gastroesophageal Reflux She complains of heartburn. This is a chronic problem. The problem occurs constantly. The heartburn is located in the substernum. Risk factors include hiatal hernia (large hiatal hernia). She has tried a PPI for the symptoms. EGD recommended by GI in april but patient unable to prep due to living situation.  Hypertension This is a chronic problem. The problem is controlled. Past treatments include angiotensin blockers. Hypertensive end-organ damage includes CAD/MI.    Lab Results  Component Value Date   CREATININE 0.7 06/20/2019   BUN 13 06/20/2019   NA 135 04/29/2019   K 4.9 04/29/2019   CL 97 04/29/2019   CO2 23 04/29/2019   Lab Results  Component Value Date   CHOL 179 06/20/2019   HDL 54 06/20/2019   LDLCALC 107 06/20/2019   TRIG 74 06/20/2019   CHOLHDL 3.2 12/26/2018   Lab Results  Component Value Date   TSH 3.450 04/29/2019   Lab Results  Component Value Date   HGBA1C 6.1 06/20/2019   Lab Results  Component Value Date   WBC 6.0 06/20/2019   HGB 40.0 (A) 06/20/2019   HCT 38.7 04/22/2019   MCV 93.0 04/22/2019   PLT 285 06/20/2019   Lab Results  Component Value Date   ALT 20 04/29/2019   AST 25 04/29/2019   ALKPHOS 113 04/29/2019   BILITOT <0.2 04/29/2019     Review of Systems  Gastrointestinal: Positive for heartburn.    Patient Active Problem List   Diagnosis Date Noted  . Hypothyroidism due to acquired atrophy of thyroid 04/29/2019  . Osteopenia determined by x-ray 04/29/2019  . Acquired claw  toe of both feet 10/23/2018  . Dermatitis 10/22/2018  . Mixed stress and urge urinary incontinence 08/08/2018  . Magnesium deficiency 08/08/2018  . Aortic atherosclerosis ([place]) 05/31/2018  . Achilles tendon disorder 04/25/2018  . Insomnia 04/25/2018  . Diastolic dysfunction 87/56/4332  . Mitral regurgitation 03/29/2018  . Personality disorder ([place]) 12/25/2017  . Arthritis of hand, degenerative 12/07/2017  . Bipolar I disorder, most recent episode mixed, severe with psychotic features ([place]) 09/18/2017  . Hallux valgus, acquired 07/05/2017  . Hammer toes of both feet 07/05/2017  . Unstable ankle 07/05/2017  . Chronic bilateral low back pain without sciatica 06/09/2017  . Tinea unguium 05/08/2017  . Conductive hearing loss of right ear with unrestricted hearing of left ear 04/03/2017  . Adjustment disorder with depressed mood 01/18/2017  . Migraine 01/18/2017  . Irritable bowel syndrome with constipation 11/17/2016  . Bilateral carpal tunnel syndrome 11/17/2016  . Plantar fasciitis, bilateral 11/17/2016  . Cervical myofascial pain syndrome 11/17/2016  . Chronic hyponatremia 11/17/2016  . PTSD (post-traumatic stress disorder) 11/17/2016  . Hiatal hernia 11/17/2016  . Vitamin D deficiency 11/17/2016  . Vitamin B12 deficiency 11/17/2016  . SIADH (syndrome of inappropriate ADH production) ([place]) 09/08/2016  . Cervical spondylosis with myelopathy 05/24/2016  . MCI (mild cognitive impairment) with memory loss 03/30/2016  . Gait abnormality 03/30/2016  . Chronic bipolar affective disorder ([place]) 01/24/2016  . Recurrent major depressive disorder, in partial remission ([place])  01/24/2016  . Anemia, iron deficiency 06/24/2015  . Benign essential HTN 06/24/2015  . History of falling 05/22/2015  . History of TIA (transient ischemic attack) 04/20/2015  . GERD (gastroesophageal reflux disease) 04/20/2015  . Cervical disc disorder at C5-C6 level with radiculopathy 03/25/2015  . Chronic neck pain  03/25/2015  . Pelvic pain in female 10/30/2014  . History of skin cancer 06/30/2014  . Chronic headaches 12/16/2013  . Neuropathy 12/16/2013    Allergies  Allergen Reactions  . Advair Diskus [Fluticasone-Salmeterol] Shortness Of Breath  . Amoxicillin-Pot Clavulanate Anaphylaxis  . Bee Venom Hives  . Darvon [Propoxyphene] Anaphylaxis and Hives    Hives/throat swelling   . Dicyclomine Itching  . Molds & Smuts Shortness Of Breath and Itching    Eyes-itchy Eyes-itchy  . Other Hives    Spider bites cause hives  . Oxycodone-Acetaminophen Nausea And Vomiting and Other (See Comments)    hallucinations   . Peanut Butter Flavor Anaphylaxis  . Peanuts [Peanut Oil] Anaphylaxis and Hives  . Penicillins Hives    Has patient had a PCN reaction causing immediate rash, facial/tongue/throat swelling, SOB or lightheadedness with hypotension: Unknown Has patient had a PCN reaction causing severe rash involving mucus membranes or skin necrosis: Unknown Has patient had a PCN reaction that required hospitalization: Unknown Has patient had a PCN reaction occurring within the last 10 years: Unknown If all of the above answers are "NO", then may proceed with Cephalosporin use.  . Sulfa Antibiotics Anaphylaxis, Hives and Itching  . Sulfacetamide Sodium Anaphylaxis, Hives and Itching  . Ultram [Tramadol] Hives  . Amikacin Nausea And Vomiting  . Aspirin Hives  . Azithromycin     Itching  . Haloperidol Nausea And Vomiting  . Hymenoptera Venom Preparations Hives    Reaction to spider bites  . Imipramine Hives    Reaction to Tofranil  . Keflex [Cephalexin] Hives  . Lactose     Other reaction(s): Unknown  . Lactose Intolerance (Gi)   . Risperdal [Risperidone]     Serotonin syndrome  . Sulfur Hives  . Adhesive [Tape] Hives and Rash  . Albuterol Palpitations    Tolerates levalbuterol  . Ciprofloxacin Other (See Comments)    tendonopathy  . Prednisone Nausea And Vomiting, Anxiety and Other (See  Comments)    Crazy mood swings, strange thoughts. Tolerates depomedrol     Past Surgical History:  Procedure Laterality Date  . APPENDECTOMY    . CHOLECYSTECTOMY    . CHOLECYSTECTOMY, LAPAROSCOPIC    . ESOPHAGOGASTRODUODENOSCOPY (EGD) WITH PROPOFOL N/A 03/29/2015   Procedure: ESOPHAGOGASTRODUODENOSCOPY (EGD) WITH PROPOFOL;  Surgeon: Josefine Class, MD;  [facility];  Service: Endoscopy;  Laterality: N/A;  . HERNIA REPAIR    . laproscopy    . TONSILLECTOMY    . uteral suspension      Social History   Tobacco Use  . Smoking status: Former Smoker    Quit date: 11/17/1976    Years since quitting: 42.6  . Smokeless tobacco: Never Used  Vaping Use  . Vaping Use: Never used  Substance Use Topics  . Alcohol use: Yes    Comment: occ  . Drug use: No     Medication list has been reviewed and updated.  No outpatient medications have been marked as taking for the 07/02/19 encounter (Office Visit) with Glean Hess, MD.    [facility] 2/9 Scores 06/19/2019 04/29/2019 07/25/2018 05/14/2018  PHQ - 2 Score 6 6 1 6   PHQ- 9 Score 24 24 -  17    GAD 7 : Generalized Anxiety Score 04/29/2019  Nervous, Anxious, on Edge 3  Control/stop worrying 3  Worry too much - different things 3  Trouble relaxing 3  Restless 3  Easily annoyed or irritable 3  Afraid - awful might happen 3  Total GAD 7 Score 21  Anxiety Difficulty Very difficult    BP Readings from Last 3 Encounters:  05/26/19 132/82  04/29/19 (!) 150/80  04/23/19 (!) 147/77    Physical Exam  Wt Readings from Last 3 Encounters:  07/02/19 143 lb (64.9 kg)  05/26/19 146 lb 2 oz (66.3 kg)  04/29/19 146 lb (66.2 kg)    There were no vitals taken for this visit.  Assessment and Plan:

## 2019-07-02 NOTE — Progress Notes (Signed)
Virtual Visit via Video Note   This visit type was conducted due to national recommendations for restrictions regarding the COVID-19 Pandemic (e.g. social distancing) in an effort to limit this patient's exposure and mitigate transmission in our community.  Due to her co-morbid illnesses, this patient is at least at moderate risk for complications without adequate follow up.  This format is felt to be most appropriate for this patient at this time.  All issues noted in this document were discussed and addressed.  A limited physical exam was performed with this format.  Please refer to the patient's chart for her consent to telehealth for [facility].   I connected with  Angela [name] on 07/02/19 by a video enabled telemedicine application and verified that I am speaking with the correct person using two identifiers. I discussed the limitations of evaluation and management by telemedicine. The patient expressed understanding and agreed to proceed.   Evaluation Performed:  Follow-up visit  Date:  07/02/2019   ID:  Angela [name], DOB [birth year], MRN [id number]  Patient Location:  [phone] [place] [address]   Provider location:   [facility], [place] office  PCP:  Glean Hess, MD  Cardiologist:  Arvid Right [facility]   Chief Complaint  Patient presents with  . other    ED Chest pain f/u stresst test. Meds reviewed verbally with pt.    History of Present Illness:    Angela [name] is a 69 y.o. female who presents via audio/video conferencing for a telehealth visit today.   The patient does not symptoms concerning for COVID-19 infection (fever, chills, cough, or new SHORTNESS OF BREATH).   Patient has a past medical history of COPD Leg edema Bipolar;/personality disorder/Depression/ PTSD/delusions seen by psychiatry Former smoker, stopped in her 37s large hiatal hernia Scoliosis, On CT: Minimal aortic athero in the  descending aorta No significant coronary calcification, no significant carotid calcification Normal echo 12/2018 Who presents for f/u of her atypical  chest pain/HTN  Last visit 05/26/19,  No active cardiac issues at that time to discuss Most of her issues were psychosocial, living conditions, hiatal hernia related On discussion today, she reports that she has "been in the ER 5 times"  [facility] 06/20/19 presented to the ED for high blood pressure and "painful spasms in chest". a lot of stress about her living situation. "probably malnourished and dehydrated because I haven't had anything to eat or drink or slept for days and days and days"  Stress test done at [facility] June 21, 2019, no ischemia ejection fraction 83% Results reviewed in detail with her  [facility] 06/14/19 Depression  [facility] 04/11/19  SOB, concerned about mold in her apartment causing shortness of breath.  [facility] 04/09/19 shortness of breath upon waking. Patient attributes this to being an allergic reaction 2/2 moving to a new apartment and Augmentin use.  [facility] 02/21/19 worsening abd pain & difficulty breathing   Again requesting a hospital bed, reports having some GI discomfort, nausea vomiting which he attributes to her hiatal hernia Trouble getting the bed Living on a blowup mattress on the floor, difficulty sleeping  Was told that she needed EGD colonoscopy prior to repair of her hiatal herni this has not been completed a   Other past medical history reviewed Seen in the ER 07/11/2018, muiltiple issues, including chest pain CT angio chest: Large hiatal hernia unchanged. No significant atherosclerosis  Seen in the emergency  room February 2020 for shortness of breath And left-sided chest pain Work-up negative  Seen by Dr. Philis Kendall Would likely need to undergo laparoscopic hiatal hernia repair and fundoplication, possible gastropexy.   Prior to procedure was referred to GI in [facility] for EGD colonoscopy  and management of her chronic constipation    Prior CV studies:   The following studies were reviewed today:    Past Medical History:  Diagnosis Date  . Agitation 09/18/2017  . Allergic rhinitis   . Altered mental status, unspecified 12/25/2018  . Anemia   . Blurred vision   . Chest pain of uncertain etiology 2/99/3716  . Confusion state 12/25/2018  . Depression   . Diverticulosis   . Endometriosis   . Falls   . GERD (gastroesophageal reflux disease)   . Hernia, hiatal   . Hypothyroidism   . IBS (irritable bowel syndrome)   . Labile mood   . Malignant neoplasm of skin   . Malnutrition of mild degree ([place]) 08/08/2018  . Migraine   . Neuropathy   . Noncompliance 12/25/2017  . PTSD (post-traumatic stress disorder)   . Thyroid disease    Past Surgical History:  Procedure Laterality Date  . APPENDECTOMY    . CHOLECYSTECTOMY    . CHOLECYSTECTOMY, LAPAROSCOPIC    . ESOPHAGOGASTRODUODENOSCOPY (EGD) WITH PROPOFOL N/A 03/29/2015   Procedure: ESOPHAGOGASTRODUODENOSCOPY (EGD) WITH PROPOFOL;  Surgeon: Josefine Class, MD;  Location: [facility] ENDOSCOPY;  Service: Endoscopy;  Laterality: N/A;  . HERNIA REPAIR    . laproscopy    . TONSILLECTOMY    . uteral suspension        Allergies:   Advair diskus [fluticasone-salmeterol], Amoxicillin-pot clavulanate, Bee venom, Darvon [propoxyphene], Dicyclomine, Molds & smuts, Other, Oxycodone-acetaminophen, Peanut butter flavor, Peanuts [peanut oil], Penicillins, Sulfa antibiotics, Sulfacetamide sodium, Ultram [tramadol], Amikacin, Aspirin, Azithromycin, Haloperidol, Hymenoptera venom preparations, Imipramine, Keflex [cephalexin], Lactose, Lactose intolerance (gi), Risperdal [risperidone], Sulfur, Adhesive [tape], Albuterol, Ciprofloxacin, and Prednisone   Social History   Tobacco Use  . Smoking status: Former Smoker    Quit date: 11/17/1976    Years since quitting: 42.6  . Smokeless tobacco: Never Used  Vaping Use  . Vaping Use: Never used   Substance Use Topics  . Alcohol use: Yes    Comment: occ  . Drug use: No     Current Outpatient Medications on File Prior to Visit  Medication Sig Dispense Refill  . acetaminophen (TYLENOL) 650 MG CR tablet Take 650 mg by mouth every 8 (eight) hours as needed for pain.    . Acetylcysteine (NAC) 500 MG CAPS Take 500 mg by mouth 2 (two) times a day.    Marland Kitchen ammonium lactate (AMLACTIN) 12 % cream Apply topically 2 (two) times daily.    Marland Kitchen atorvastatin (LIPITOR) 20 MG tablet Take 1 tablet (20 mg total) by mouth at bedtime. 90 tablet 0  . Calcium Citrate (CITRACAL PO) Take 800 mg by mouth. Takes 2 (400mg ) tablets BID    . Cholecalciferol 100 MCG (4000 UT) CAPS Take 3 capsules by mouth.    . clobetasol (TEMOVATE) 0.05 % external solution Apply topically as directed. Qd to aa itchy rash until clear, avoid face, groin, axilla 50 mL 0  . Cyanocobalamin (VITAMIN B-12) 1000 MCG SUBL Place 1,000 mcg under the tongue.     . diclofenac sodium (VOLTAREN) 1 % GEL Apply small amount (2 grams) topically over knees or hands up to four times a day if needed for arthritis 100 g 0  .  famotidine (PEPCID) 20 MG tablet Take 1 tablet (20 mg total) by mouth 2 (two) times daily as needed for heartburn or indigestion. 180 tablet 0  . ferrous sulfate 325 (65 FE) MG tablet Take 1 tablet (325 mg total) by mouth daily with breakfast. 90 tablet 0  . fluconazole (DIFLUCAN) 200 MG tablet Take 200 mg by mouth as needed.     . hydrOXYzine (ATARAX/VISTARIL) 10 MG tablet TAKE 1 TABLET BY MOUTH EVERY 8 HOURS AS NEEDED 30 tablet 0  . Icosapent Ethyl (VASCEPA) 1 g CAPS TAKE 2 CAPSULES BY MOUTH TWICE DAILY (TO HELP WITH TRIGLYCERIDES) 180 capsule 0  . ketoconazole (NIZORAL) 2 % cream     . Lactulose 20 GM/30ML SOLN 30 ml every 4 hours until constipation is relieved 236 mL 0  . levalbuterol (XOPENEX) 0.63 MG/3ML nebulizer solution Take 3 mLs (0.63 mg total) by nebulization every 4 (four) hours as needed for wheezing or shortness of  breath. 300 mL 2  . levothyroxine (SYNTHROID) 100 MCG tablet Take 100 mcg by mouth daily before breakfast.    . linaclotide (LINZESS) 290 MCG CAPS capsule Take 1 capsule (290 mcg total) by mouth daily before breakfast. 30 capsule 0  . LORazepam (ATIVAN) 0.5 MG tablet TAKE 1 TABLET BY MOUTH AS NEEDED ONCE DAILY    . losartan (COZAAR) 50 MG tablet Take 2 tablets (100 mg total) by mouth daily. 180 tablet 0  . Magnesium 500 MG TABS Take 500 mg by mouth every other day.    . mupirocin ointment (BACTROBAN) 2 % Apply 1 application topically 3 (three) times daily.    Marland Kitchen PARoxetine (PAXIL) 20 MG tablet Take 20 mg by mouth daily.    . prednisoLONE acetate (PRED FORTE) 1 % ophthalmic suspension INSTILL 1 DROP INTO EACH EYE TWICE DAILY    . PREMARIN vaginal cream PLACE 1 APPLICATORFUL VAGINALLY TWO TIMES A WEEK. 30 g 1  . promethazine (PHENERGAN) 12.5 MG tablet Take 12.5 mg by mouth every 6 (six) hours as needed for nausea or vomiting.    . pyridoxine (B-6) 100 MG tablet Take 1 tablet (100 mg total) by mouth 2 (two) times daily. 60 tablet 0  . tiZANidine (ZANAFLEX) 2 MG tablet Take by mouth every 6 (six) hours as needed for muscle spasms.    Marland Kitchen umeclidinium bromide (INCRUSE ELLIPTA) 62.5 MCG/INH AEPB Inhale 1 puff into the lungs daily. 60 each 5  . Vitamin D, Ergocalciferol, (DRISDOL) 1.25 MG (50000 UNIT) CAPS capsule Take 1 capsule (50,000 Units total) by mouth every 7 (seven) days. 12 capsule 3  . triamcinolone ointment (KENALOG) 0.5 % Apply 1 application topically 2 (two) times daily. 30 g 2  . XIIDRA 5 % SOLN INSTILL 1 DROP INTO EACH EYE TWICE DAILY    . [DISCONTINUED] traZODone (DESYREL) 50 MG tablet Take 1 tablet (50 mg total) by mouth at bedtime as needed. Pt takes 1/2 tablet qhs 90 tablet 0   No current facility-administered medications on file prior to visit.     Family Hx: The patient's family history includes Anxiety disorder in her brother and brother; Arthritis in her brother, brother, and  sister; COPD in her father; Cancer in her mother; Depression in her father and sister; Hearing loss in her father; Heart disease in her father; Hernia in her brother and brother; Stroke in her father; Varicose Veins in her father; Vision loss in her father. There is no history of Urolithiasis, Kidney disease, Kidney cancer, or Prostate cancer.  ROS:  Please see the history of present illness.    Review of Systems  Constitutional: Negative.   HENT: Negative.   Respiratory: Negative.   Cardiovascular: Negative.   Gastrointestinal: Negative.   Musculoskeletal: Negative.   Neurological: Negative.   Psychiatric/Behavioral: Negative.   All other systems reviewed and are negative.    Labs/Other Tests and Data Reviewed:    Recent Labs: 10/22/2018: Magnesium 2.3 04/29/2019: ALT 20; Potassium 4.9; Sodium 135; TSH 3.450 06/20/2019: BUN 13; Creatinine 0.7; Hemoglobin 40.0; Platelets 285   Recent Lipid Panel Lab Results  Component Value Date/Time   CHOL 179 06/20/2019 12:00 AM   TRIG 74 06/20/2019 12:00 AM   HDL 54 06/20/2019 12:00 AM   CHOLHDL 3.2 12/26/2018 06:27 AM   LDLCALC 107 06/20/2019 12:00 AM   LDLCALC 92 02/26/2018 04:12 PM    Wt Readings from Last 3 Encounters:  07/02/19 143 lb (64.9 kg)  05/26/19 146 lb 2 oz (66.3 kg)  04/29/19 146 lb (66.2 kg)     Exam:    Vital Signs: Vital signs may also be detailed in the HPI Ht 5\' 3"  (1.6 m)   Wt 143 lb (64.9 kg)   BMI 25.33 kg/m   Wt Readings from Last 3 Encounters:  07/02/19 143 lb (64.9 kg)  05/26/19 146 lb 2 oz (66.3 kg)  04/29/19 146 lb (66.2 kg)   Temp Readings from Last 3 Encounters:  04/29/19 (!) 97.5 F (36.4 C) (Temporal)  04/22/19 98.5 F (36.9 C) (Oral)  04/22/19 98.5 F (36.9 C) (Oral)   BP Readings from Last 3 Encounters:  05/26/19 132/82  04/29/19 (!) 150/80  04/23/19 (!) 147/77   Pulse Readings from Last 3 Encounters:  05/26/19 (!) 57  04/29/19 60  04/23/19 70     Well nourished, well developed  female in no acute distress. Constitutional:  oriented to person, place, and time. No distress.    ASSESSMENT & PLAN:    Problem List Items Addressed This Visit      Cardiology Problems   Benign essential HTN   Aortic atherosclerosis ([place]) - Primary     Other   Hiatal hernia   Chronic bipolar affective disorder (HCC)     Chronic chest pain Atypical in nature, Likely exacerbated by hiatal hernia We will see if primary care or our office could assist with getting a hospital bed Would do well with incline, currently sleeping on a mattress likely contributing to her symptoms No active cardiac issues  Bipolar disorder/delusions/depression Long psychiatric history would appear Patient evaluated at [facility] emergency room Has a conservator that helps her with her finances by report Ideally would probably do best in a assisted facility    COVID-19 Education: The signs and symptoms of COVID-19 were discussed with the patient and how to seek care for testing (follow up with PCP or arrange E-visit).  The importance of social distancing was discussed today.  Patient Risk:   After full review of this patients clinical status, I feel that they are at least moderate risk at this time.  Time:   Today, I have spent 30 minutes with the patient with telehealth technology discussing the cardiac and medical problems/diagnoses detailed above   Additional 10 min spent reviewing the chart prior to patient visit today   Medication Adjustments/Labs and Tests Ordered: Current medicines are reviewed at length with the patient today.  Concerns regarding medicines are outlined above.   Tests Ordered: No tests ordered   Medication Changes: No changes made  Disposition: Follow-up as needed   Signed, Ida Rogue, MD  [place] Office [address] [place], [place], [place] [address]

## 2019-07-08 ENCOUNTER — Other Ambulatory Visit: Payer: Self-pay | Admitting: Dermatology

## 2019-07-08 ENCOUNTER — Telehealth: Payer: Self-pay | Admitting: Cardiovascular Disease

## 2019-07-08 NOTE — Telephone Encounter (Signed)
Patient calling  Would like to speak with Pam and would like to know if we spoke to the doctor in [place] and if we will be able to order bed  Please call to discuss

## 2019-07-08 NOTE — Telephone Encounter (Signed)
Spoke with patient and she was inquiring about the hospital bed that she needs. She had appt last week with Dr. Rockey [name] and was also supposed to see her PCP as well. Dr. Rockey [name] was waiting to see if her PCP would handle this order given that we do not typically order this type of medical requests here in our office. She needs full size adjustable frame electric bed for her hiatal hernia to allow for incline rest. She did inquire about possible automated recliner that stands up but let her know that we would need to work on these one at a time. Advised that I would check with her PCP provider Dr. Army [name] to see what the status is on her request and that I would be in touch with her. She was appreciative for the call back and assistance with checking on this for her. Sent staff message to PCP and will see if they need assistance with anything.

## 2019-07-14 ENCOUNTER — Telehealth: Payer: Medicare Other | Admitting: Cardiovascular Disease

## 2019-07-16 DIAGNOSIS — J302 Other seasonal allergic rhinitis: Secondary | ICD-10-CM | POA: Diagnosis not present

## 2019-07-16 DIAGNOSIS — J3089 Other allergic rhinitis: Secondary | ICD-10-CM | POA: Diagnosis not present

## 2019-07-16 DIAGNOSIS — R0602 Shortness of breath: Secondary | ICD-10-CM | POA: Diagnosis not present

## 2019-07-16 DIAGNOSIS — J3489 Other specified disorders of nose and nasal sinuses: Secondary | ICD-10-CM | POA: Diagnosis not present

## 2019-07-16 DIAGNOSIS — J324 Chronic pansinusitis: Secondary | ICD-10-CM | POA: Diagnosis not present

## 2019-07-16 NOTE — Telephone Encounter (Signed)
No answer/voicemail box is full.  

## 2019-07-17 ENCOUNTER — Encounter: Payer: Self-pay | Admitting: Internal Medicine
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
## 2019-07-18 ENCOUNTER — Encounter: Payer: Medicare Other | Admitting: Internal Medicine

## 2019-07-18 NOTE — Telephone Encounter (Signed)
Pam is out of the office today. Will route to Pam to address when in the office again. My understanding from brief discussion with Pam yesterday was that we would not be able to get her a hospital bed for home.

## 2019-07-18 NOTE — Telephone Encounter (Signed)
Patient would like to know the status of her hospital bed being ordered. Please call to discuss.

## 2019-07-23 NOTE — Telephone Encounter (Signed)
Patient has a temporary number that she can be reached at [phone]  Patient would be preferred to be called around noon due to poor sleeping habits

## 2019-07-23 NOTE — Telephone Encounter (Signed)
Patient calling back in wanting to speak to any nurse because she is wanting an update about Nurse Pam's conversation with Dr. Army Melia. Patient was advised Nurse Pam is out of the office until tomorrow but patient insists on talking to someone now due to phone issues  Please advise

## 2019-07-24 NOTE — Telephone Encounter (Signed)
Spoke with patient and reviewed that we sent request to her PCP for assistance with medical bed. Inquired if she went to recent appointment. Patient states that on her appointment day it was raining very bad and she was not able to keep appointment. She called to make them aware but there were some phone issues. Reviewed that we do not order medical equipment and she would need to discuss with her primary provider. She verbalized understanding of our conversation, was very appreciative for the time spent looking into this, and had no further questions at this time.

## 2019-08-04 ENCOUNTER — Telehealth: Payer: Self-pay | Admitting: Obstetrics and Gynecology

## 2019-08-04 MED ORDER — PREMARIN 0.625 MG/GM VA CREA: TOPICAL_CREAM | VAGINAL | 1 refills | Status: DC

## 2019-08-04 NOTE — Telephone Encounter (Signed)
Patient called in stating that her request for a refill on her medication was denied and she would like to know why. Patient is requesting premarin be refilled. Could you please advise?

## 2019-08-06 ENCOUNTER — Other Ambulatory Visit: Payer: Self-pay | Admitting: Cardiovascular Disease

## 2019-08-06 DIAGNOSIS — I10 Essential (primary) hypertension: Secondary | ICD-10-CM

## 2019-08-06 NOTE — Telephone Encounter (Signed)
Pt called no answer unable to leave message due to vm is full. Was calling pt to see which medication she needed a refill of. If it was the Premarin cream AC refilled the medication on 08/04/2019.

## 2019-08-06 NOTE — Telephone Encounter (Signed)
Spoke to pt concerning her call to the office. Pt is aware that [facility] refilled her premarin cream on the 19th of July. Pt stated that she would check with the pharmacy on the status of her medication.

## 2019-08-21 ENCOUNTER — Telehealth: Payer: Self-pay
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
## 2019-08-21 NOTE — Telephone Encounter (Signed)
ERROR

## 2019-08-22 DIAGNOSIS — M47812 Spondylosis without myelopathy or radiculopathy, cervical region: Secondary | ICD-10-CM | POA: Diagnosis not present

## 2019-08-22 DIAGNOSIS — M66879 Spontaneous rupture of other tendons, unspecified ankle and foot: Secondary | ICD-10-CM | POA: Diagnosis not present

## 2019-08-22 DIAGNOSIS — M4712 Other spondylosis with myelopathy, cervical region: Secondary | ICD-10-CM | POA: Diagnosis not present

## 2019-09-03 ENCOUNTER — Ambulatory Visit: Payer: Medicare Other | Admitting: Internal Medicine

## 2019-09-08 ENCOUNTER — Ambulatory Visit: Payer: Medicare Other | Admitting: Podiatry

## 2019-09-22 DIAGNOSIS — K219 Gastro-esophageal reflux disease without esophagitis: Secondary | ICD-10-CM | POA: Diagnosis not present

## 2019-09-22 DIAGNOSIS — R9431 Abnormal electrocardiogram [ECG] [EKG]: Secondary | ICD-10-CM | POA: Diagnosis not present

## 2019-09-22 DIAGNOSIS — R6889 Other general symptoms and signs: Secondary | ICD-10-CM | POA: Diagnosis not present

## 2019-09-22 DIAGNOSIS — G43909 Migraine, unspecified, not intractable, without status migrainosus: Secondary | ICD-10-CM | POA: Diagnosis not present

## 2019-09-22 DIAGNOSIS — R52 Pain, unspecified: Secondary | ICD-10-CM | POA: Diagnosis not present

## 2019-09-22 DIAGNOSIS — M810 Age-related osteoporosis without current pathological fracture: Secondary | ICD-10-CM | POA: Diagnosis not present

## 2019-09-22 DIAGNOSIS — Z880 Allergy status to penicillin: Secondary | ICD-10-CM | POA: Diagnosis not present

## 2019-09-22 DIAGNOSIS — R55 Syncope and collapse: Secondary | ICD-10-CM | POA: Diagnosis not present

## 2019-09-22 DIAGNOSIS — J449 Chronic obstructive pulmonary disease, unspecified: Secondary | ICD-10-CM | POA: Diagnosis not present

## 2019-09-22 DIAGNOSIS — D649 Anemia, unspecified: Secondary | ICD-10-CM | POA: Diagnosis not present

## 2019-09-22 DIAGNOSIS — Z20822 Contact with and (suspected) exposure to covid-19: Secondary | ICD-10-CM | POA: Diagnosis not present

## 2019-09-22 DIAGNOSIS — Z8673 Personal history of transient ischemic attack (TIA), and cerebral infarction without residual deficits: Secondary | ICD-10-CM | POA: Diagnosis not present

## 2019-09-22 DIAGNOSIS — R001 Bradycardia, unspecified: Secondary | ICD-10-CM | POA: Diagnosis not present

## 2019-09-22 DIAGNOSIS — K449 Diaphragmatic hernia without obstruction or gangrene: Secondary | ICD-10-CM | POA: Diagnosis not present

## 2019-09-22 DIAGNOSIS — R631 Polydipsia: Secondary | ICD-10-CM | POA: Diagnosis not present

## 2019-09-22 DIAGNOSIS — Z881 Allergy status to other antibiotic agents status: Secondary | ICD-10-CM | POA: Diagnosis not present

## 2019-09-22 DIAGNOSIS — Z743 Need for continuous supervision: Secondary | ICD-10-CM | POA: Diagnosis not present

## 2019-09-22 DIAGNOSIS — Z87891 Personal history of nicotine dependence: Secondary | ICD-10-CM | POA: Diagnosis not present

## 2019-09-22 DIAGNOSIS — G8929 Other chronic pain: Secondary | ICD-10-CM | POA: Diagnosis not present

## 2019-09-22 DIAGNOSIS — E871 Hypo-osmolality and hyponatremia: Secondary | ICD-10-CM | POA: Diagnosis not present

## 2019-09-22 DIAGNOSIS — E039 Hypothyroidism, unspecified: Secondary | ICD-10-CM | POA: Diagnosis not present

## 2019-09-22 DIAGNOSIS — I679 Cerebrovascular disease, unspecified: Secondary | ICD-10-CM | POA: Diagnosis not present

## 2019-09-22 DIAGNOSIS — M8580 Other specified disorders of bone density and structure, unspecified site: Secondary | ICD-10-CM | POA: Diagnosis not present

## 2019-09-22 DIAGNOSIS — R404 Transient alteration of awareness: Secondary | ICD-10-CM | POA: Diagnosis not present

## 2019-09-22 DIAGNOSIS — Z5181 Encounter for therapeutic drug level monitoring: Secondary | ICD-10-CM | POA: Diagnosis not present

## 2019-09-23 DIAGNOSIS — E079 Disorder of thyroid, unspecified: Secondary | ICD-10-CM | POA: Diagnosis not present

## 2019-09-23 DIAGNOSIS — R55 Syncope and collapse: Secondary | ICD-10-CM | POA: Diagnosis not present

## 2019-09-24 DIAGNOSIS — J449 Chronic obstructive pulmonary disease, unspecified: Secondary | ICD-10-CM | POA: Diagnosis not present

## 2019-09-26 ENCOUNTER — Telehealth: Payer: Self-pay | Admitting: Internal Medicine

## 2019-09-26 NOTE — Telephone Encounter (Signed)
MAILBOX FULL. Pt due to schedule Medicare Annual Wellness Visit (AWV) either virtually/audio only or in office. Whichever the patients preference is.  Last AWV 02/08/18; please schedule at anytime with [facility] Advisor.  This should be a 40 minute visit.

## 2019-10-21 DIAGNOSIS — E034 Atrophy of thyroid (acquired): Secondary | ICD-10-CM | POA: Diagnosis not present

## 2019-10-21 DIAGNOSIS — Z604 Social exclusion and rejection: Secondary | ICD-10-CM | POA: Diagnosis not present

## 2019-10-21 DIAGNOSIS — E222 Syndrome of inappropriate secretion of antidiuretic hormone: Secondary | ICD-10-CM | POA: Diagnosis not present

## 2019-10-26 ENCOUNTER — Other Ambulatory Visit: Payer: Self-pay

## 2019-10-26 ENCOUNTER — Encounter (HOSPITAL_COMMUNITY): Payer: Self-pay | Admitting: Emergency Medicine

## 2019-10-26 ENCOUNTER — Emergency Department (HOSPITAL_COMMUNITY)
Admission: EM | Admit: 2019-10-26 | Discharge: 2019-10-27 | Disposition: A | Discharge status: Home / Self Care | Payer: Medicare Other | Attending: Emergency Medicine | Admitting: Emergency Medicine

## 2019-10-26 ENCOUNTER — Emergency Department (HOSPITAL_COMMUNITY): Payer: Medicare Other

## 2019-10-26 DIAGNOSIS — Z79890 Hormone replacement therapy (postmenopausal): Secondary | ICD-10-CM | POA: Diagnosis not present

## 2019-10-26 DIAGNOSIS — Z8673 Personal history of transient ischemic attack (TIA), and cerebral infarction without residual deficits: Secondary | ICD-10-CM | POA: Diagnosis not present

## 2019-10-26 DIAGNOSIS — I10 Essential (primary) hypertension: Secondary | ICD-10-CM | POA: Diagnosis not present

## 2019-10-26 DIAGNOSIS — Z87891 Personal history of nicotine dependence: Secondary | ICD-10-CM | POA: Diagnosis not present

## 2019-10-26 DIAGNOSIS — Z77098 Contact with and (suspected) exposure to other hazardous, chiefly nonmedicinal, chemicals: Secondary | ICD-10-CM | POA: Diagnosis not present

## 2019-10-26 DIAGNOSIS — E039 Hypothyroidism, unspecified: Secondary | ICD-10-CM | POA: Diagnosis not present

## 2019-10-26 DIAGNOSIS — Z85828 Personal history of other malignant neoplasm of skin: Secondary | ICD-10-CM | POA: Insufficient documentation

## 2019-10-26 DIAGNOSIS — Z91010 Allergy to peanuts: Secondary | ICD-10-CM | POA: Insufficient documentation

## 2019-10-26 DIAGNOSIS — J449 Chronic obstructive pulmonary disease, unspecified: Secondary | ICD-10-CM | POA: Insufficient documentation

## 2019-10-26 DIAGNOSIS — K219 Gastro-esophageal reflux disease without esophagitis: Secondary | ICD-10-CM | POA: Diagnosis not present

## 2019-10-26 DIAGNOSIS — Z79899 Other long term (current) drug therapy: Secondary | ICD-10-CM | POA: Insufficient documentation

## 2019-10-26 DIAGNOSIS — J90 Pleural effusion, not elsewhere classified: Secondary | ICD-10-CM | POA: Diagnosis not present

## 2019-10-26 HISTORY — DX: Chronic obstructive pulmonary disease, unspecified: J44.9
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
## 2019-10-27 DIAGNOSIS — Z77098 Contact with and (suspected) exposure to other hazardous, chiefly nonmedicinal, chemicals: Secondary | ICD-10-CM | POA: Diagnosis not present

## 2019-10-27 MED ORDER — ALBUTEROL SULFATE HFA 108 (90 BASE) MCG/ACT IN AERS
2.0000 | INHALATION_SPRAY | Freq: Once | RESPIRATORY_TRACT | Status: AC
  Administered 2019-10-27: 2 via RESPIRATORY_TRACT
  Filled 2019-10-27: qty 6.7
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
## 2019-10-27 NOTE — Progress Notes (Signed)
   10/27/19 1102  TOC ED Mini Assessment  TOC Time spent with patient (minutes): 25  PING Used in TOC Assessment No  Admission or Readmission Diverted Yes  Interventions which prevented an admission or readmission IRC;Homeless Screening  What brought you to the Emergency Department?  Mold in apartment  Barriers to Discharge No Barriers Identified  Means of departure Public Transportation  Patient states their goals for this hospitalization and ongoing recovery are: Find a new apartment
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
## 2019-10-27 NOTE — ED Notes (Signed)
Pt requesting to talk to charge nurse and social work regarding her care.

## 2019-10-27 NOTE — Social Work (Signed)
TOC team met with Pt at bedside and offered list of shelter resources. Pt states that such a list will no do her any good as she has already called all of the shelters and they have denied her a bed. TOC team suggested that Pt avail resources at [facility], Pt rejected this suggestion. Team gave Pt a buspass for discharge and added homelessness resources to AVS.

## 2019-10-27 NOTE — Progress Notes (Signed)
   10/27/19 1000  Clinical Encounter Type  Visited With Patient  Visit Type Social support  Referral From Nurse  Consult/Referral To [place]   The chaplain spoke with the patient in the ED. She requested prayer for shelter and healing for her body. The chaplain prayed and provided emotional support to the patient along with spiritual care. The chaplain spoke to the patient about getting in contact with the social work department. The social work department arrived as I was leaving. The chaplain will follow up as needed.

## 2019-10-28 ENCOUNTER — Ambulatory Visit
Admission: RE | Admit: 2019-10-28 | Discharge: 2019-10-28 | Disposition: A | Discharge status: Home / Self Care | Payer: Medicare Other | Source: Ambulatory Visit | Attending: Obstetrics and Gynecology | Admitting: Obstetrics and Gynecology

## 2019-10-28 ENCOUNTER — Other Ambulatory Visit: Payer: Self-pay

## 2019-10-28 DIAGNOSIS — Z1231 Encounter for screening mammogram for malignant neoplasm of breast: Secondary | ICD-10-CM | POA: Diagnosis not present

## 2019-10-29 DIAGNOSIS — E039 Hypothyroidism, unspecified: Secondary | ICD-10-CM | POA: Diagnosis not present

## 2019-10-29 DIAGNOSIS — Z8639 Personal history of other endocrine, nutritional and metabolic disease: Secondary | ICD-10-CM | POA: Diagnosis not present

## 2019-10-29 DIAGNOSIS — R0600 Dyspnea, unspecified: Secondary | ICD-10-CM | POA: Diagnosis not present

## 2019-11-08 DIAGNOSIS — R059 Cough, unspecified: Secondary | ICD-10-CM | POA: Diagnosis not present

## 2019-11-08 DIAGNOSIS — J449 Chronic obstructive pulmonary disease, unspecified: Secondary | ICD-10-CM | POA: Diagnosis not present

## 2019-11-08 DIAGNOSIS — J441 Chronic obstructive pulmonary disease with (acute) exacerbation: Secondary | ICD-10-CM | POA: Diagnosis not present

## 2019-11-13 DIAGNOSIS — S0501XA Injury of conjunctiva and corneal abrasion without foreign body, right eye, initial encounter: Secondary | ICD-10-CM | POA: Diagnosis not present

## 2019-11-13 DIAGNOSIS — H04123 Dry eye syndrome of bilateral lacrimal glands: Secondary | ICD-10-CM | POA: Diagnosis not present

## 2019-11-13 DIAGNOSIS — X58XXXA Exposure to other specified factors, initial encounter: Secondary | ICD-10-CM | POA: Diagnosis not present

## 2019-11-14 DIAGNOSIS — M66872 Spontaneous rupture of other tendons, left ankle and foot: Secondary | ICD-10-CM | POA: Diagnosis not present

## 2019-11-14 DIAGNOSIS — M2041 Other hammer toe(s) (acquired), right foot: Secondary | ICD-10-CM | POA: Diagnosis not present

## 2019-11-14 DIAGNOSIS — M25572 Pain in left ankle and joints of left foot: Secondary | ICD-10-CM | POA: Diagnosis not present

## 2019-11-14 DIAGNOSIS — M2011 Hallux valgus (acquired), right foot: Secondary | ICD-10-CM | POA: Diagnosis not present

## 2019-11-14 DIAGNOSIS — M2012 Hallux valgus (acquired), left foot: Secondary | ICD-10-CM | POA: Diagnosis not present

## 2019-11-14 DIAGNOSIS — M205X9 Other deformities of toe(s) (acquired), unspecified foot: Secondary | ICD-10-CM | POA: Diagnosis not present

## 2019-11-14 DIAGNOSIS — I10 Essential (primary) hypertension: Secondary | ICD-10-CM | POA: Diagnosis not present

## 2019-11-14 DIAGNOSIS — M2042 Other hammer toe(s) (acquired), left foot: Secondary | ICD-10-CM | POA: Diagnosis not present

## 2019-11-14 DIAGNOSIS — M2040 Other hammer toe(s) (acquired), unspecified foot: Secondary | ICD-10-CM | POA: Diagnosis not present

## 2019-11-14 DIAGNOSIS — M25571 Pain in right ankle and joints of right foot: Secondary | ICD-10-CM | POA: Diagnosis not present

## 2019-11-14 DIAGNOSIS — Q666 Other congenital valgus deformities of feet: Secondary | ICD-10-CM | POA: Diagnosis not present

## 2019-11-14 DIAGNOSIS — M2010 Hallux valgus (acquired), unspecified foot: Secondary | ICD-10-CM | POA: Diagnosis not present

## 2019-11-14 DIAGNOSIS — M66879 Spontaneous rupture of other tendons, unspecified ankle and foot: Secondary | ICD-10-CM | POA: Diagnosis not present

## 2019-11-19 DIAGNOSIS — H04123 Dry eye syndrome of bilateral lacrimal glands: Secondary | ICD-10-CM | POA: Diagnosis not present

## 2019-11-20 ENCOUNTER — Telehealth: Payer: Self-pay | Admitting: Cardiovascular Disease

## 2019-11-20 NOTE — Telephone Encounter (Signed)
Patient returning call about losartan and wants a call back tonight

## 2019-11-21 ENCOUNTER — Other Ambulatory Visit: Payer: Self-pay | Admitting: Cardiovascular Disease

## 2019-11-21 ENCOUNTER — Telehealth: Payer: Self-pay

## 2019-11-21 DIAGNOSIS — I10 Essential (primary) hypertension: Secondary | ICD-10-CM

## 2019-11-21 MED ORDER — LOSARTAN POTASSIUM 50 MG PO TABS: 100.0000 mg | ORAL_TABLET | Freq: Every day | ORAL | 1 refills | Status: DC

## 2019-11-21 NOTE — Telephone Encounter (Signed)
Patient calling wanting to know why we are not refilling her losartan.  Patient does not want me to take a know she would rather wait on the phone because "the last time everyone was busy and no one called her"  Patient stated she NEEDED this medication sent in and she needs it sent in [place].   Placed her on hold @ 2:52pm and sent it to triage.

## 2019-11-21 NOTE — Telephone Encounter (Signed)
Secure chat message received from Dr. Rockey Situ stating: can we let patient know losartan called in. she does NOT need an appt. needs to establish with new PMD. she has psych issues, no cardiac issues   I attempted to call the patient back. No answer- unable to leave a message on her identified voice mail as "the voice mail box if full."

## 2019-11-22 DIAGNOSIS — R0689 Other abnormalities of breathing: Secondary | ICD-10-CM | POA: Diagnosis not present

## 2019-11-22 DIAGNOSIS — K921 Melena: Secondary | ICD-10-CM | POA: Diagnosis not present

## 2019-11-22 DIAGNOSIS — K625 Hemorrhage of anus and rectum: Secondary | ICD-10-CM | POA: Diagnosis not present

## 2019-11-22 DIAGNOSIS — R1031 Right lower quadrant pain: Secondary | ICD-10-CM | POA: Diagnosis not present

## 2019-11-22 DIAGNOSIS — R109 Unspecified abdominal pain: Secondary | ICD-10-CM | POA: Diagnosis not present

## 2019-11-22 DIAGNOSIS — F32A Depression, unspecified: Secondary | ICD-10-CM | POA: Diagnosis not present

## 2019-11-22 DIAGNOSIS — R110 Nausea: Secondary | ICD-10-CM | POA: Diagnosis not present

## 2019-11-22 DIAGNOSIS — R300 Dysuria: Secondary | ICD-10-CM | POA: Diagnosis not present

## 2019-11-22 DIAGNOSIS — Z743 Need for continuous supervision: Secondary | ICD-10-CM | POA: Diagnosis not present

## 2019-11-22 DIAGNOSIS — R1030 Lower abdominal pain, unspecified: Secondary | ICD-10-CM | POA: Diagnosis not present

## 2019-11-22 DIAGNOSIS — R58 Hemorrhage, not elsewhere classified: Secondary | ICD-10-CM | POA: Diagnosis not present

## 2019-11-22 DIAGNOSIS — R6889 Other general symptoms and signs: Secondary | ICD-10-CM | POA: Diagnosis not present

## 2019-11-24 NOTE — Telephone Encounter (Signed)
[place] CMA did get this taken care of on Friday. New prescription was sent in to patients pharmacy and patient updated.

## 2019-11-25 NOTE — Telephone Encounter (Signed)
No answer/Voicemail box is full.  

## 2019-11-26 ENCOUNTER — Telehealth: Payer: Self-pay

## 2019-11-26 ENCOUNTER — Other Ambulatory Visit: Payer: Self-pay

## 2019-11-26 MED ORDER — FLUCONAZOLE 150 MG PO TABS: 150.0000 mg | ORAL_TABLET | Freq: Once | ORAL | 1 refills | Status: AC

## 2019-11-26 NOTE — Telephone Encounter (Signed)
Pt is aware that the medication has been sent to her pharmacy.

## 2019-11-26 NOTE — Telephone Encounter (Signed)
Pt called in and stated that she went to a urgent care and that she has a yeast infection. The pt said that they sent in a antibiotics but the pt is still having issues. The pt was hoping Dr. Marcelline Mates would send in some Diflucan to her pharmacy [facility], [address], [place] [phone]. I told the pt I will send a message, but she didn't see you and I don't know if she will refill that for you. I advised the pt to call the urgent care she went to. The pt said that she wants to see if Dr. Marcelline Mates will fill it. I informed to pt that I will send a message to allow 24-48 hours for a reply

## 2019-11-26 NOTE — Telephone Encounter (Signed)
Patient called in stating that she is in a wheelchair and that she has to pay someone to take her to the doctors and that's very expensive and she cannot afford that. Patient states that she wanted her provider to understand her circumstances before making a decision on whether she needed her to come in office for a visit.  Could you please advise?

## 2019-12-01 NOTE — Telephone Encounter (Signed)
Patient returning call.  She says she was admitted to the hospital and unable to respond.    Patient would like a call back from Dr. Rockey Situ himself if possible to address concerns about HTN and a letter he wrote referring patient for procedure that recipient declined to honor  Patient will be unavailable Tuesday Wednesday and Thursday.   Please call today .  Patient says next time she will be able to answer would be Friday.    Patient aware multiple attempts made to contact her and he vm is full. Patient aware Pam is not in the office today and she states she doesn't believe anyone will call her back.

## 2019-12-01 NOTE — Telephone Encounter (Signed)
Attempted to call the pt back but unable to leave a message will continue to try her this afternoon.

## 2019-12-02 NOTE — Telephone Encounter (Signed)
No answer and VM full on mobile number. Other number no answer and call cannot be completed at dialed.

## 2019-12-03 NOTE — Telephone Encounter (Signed)
If patient calls back, patient will need telephone virtual visit with Dr Rockey Situ at her convenience per management to discuss her concerns. If help is needed to find an appointment, please let one of the RN's know.

## 2019-12-04 NOTE — Telephone Encounter (Signed)
Attempted to call the patient on her preferred #. No answer- voice mail box is full.

## 2019-12-07 NOTE — Telephone Encounter (Signed)
Okay to close encounter she has her medication refill

## 2019-12-22 DIAGNOSIS — M7661 Achilles tendinitis, right leg: Secondary | ICD-10-CM | POA: Diagnosis not present

## 2019-12-22 DIAGNOSIS — M76822 Posterior tibial tendinitis, left leg: Secondary | ICD-10-CM | POA: Diagnosis not present

## 2019-12-26 DIAGNOSIS — H9201 Otalgia, right ear: Secondary | ICD-10-CM | POA: Diagnosis not present

## 2019-12-26 DIAGNOSIS — H7291 Unspecified perforation of tympanic membrane, right ear: Secondary | ICD-10-CM | POA: Diagnosis not present

## 2019-12-26 DIAGNOSIS — H9313 Tinnitus, bilateral: Secondary | ICD-10-CM | POA: Diagnosis not present

## 2019-12-26 DIAGNOSIS — J328 Other chronic sinusitis: Secondary | ICD-10-CM | POA: Diagnosis not present

## 2019-12-26 DIAGNOSIS — H90A31 Mixed conductive and sensorineural hearing loss, unilateral, right ear with restricted hearing on the contralateral side: Secondary | ICD-10-CM | POA: Diagnosis not present

## 2019-12-31 DIAGNOSIS — M79671 Pain in right foot: Secondary | ICD-10-CM | POA: Diagnosis not present

## 2019-12-31 DIAGNOSIS — M25572 Pain in left ankle and joints of left foot: Secondary | ICD-10-CM | POA: Diagnosis not present

## 2019-12-31 DIAGNOSIS — M79672 Pain in left foot: Secondary | ICD-10-CM | POA: Diagnosis not present

## 2019-12-31 DIAGNOSIS — M25571 Pain in right ankle and joints of right foot: Secondary | ICD-10-CM | POA: Diagnosis not present

## 2019-12-31 DIAGNOSIS — G894 Chronic pain syndrome: Secondary | ICD-10-CM | POA: Diagnosis not present

## 2020-01-01 DIAGNOSIS — J328 Other chronic sinusitis: Secondary | ICD-10-CM | POA: Diagnosis not present

## 2020-01-01 DIAGNOSIS — J3489 Other specified disorders of nose and nasal sinuses: Secondary | ICD-10-CM | POA: Diagnosis not present

## 2020-01-05 DIAGNOSIS — Z8673 Personal history of transient ischemic attack (TIA), and cerebral infarction without residual deficits: Secondary | ICD-10-CM | POA: Diagnosis not present

## 2020-01-05 DIAGNOSIS — I10 Essential (primary) hypertension: Secondary | ICD-10-CM | POA: Diagnosis not present

## 2020-01-05 DIAGNOSIS — Z5900 Homelessness unspecified: Secondary | ICD-10-CM | POA: Diagnosis not present

## 2020-01-05 DIAGNOSIS — Z85828 Personal history of other malignant neoplasm of skin: Secondary | ICD-10-CM | POA: Diagnosis not present

## 2020-01-05 DIAGNOSIS — Z9049 Acquired absence of other specified parts of digestive tract: Secondary | ICD-10-CM | POA: Diagnosis not present

## 2020-01-05 DIAGNOSIS — R682 Dry mouth, unspecified: Secondary | ICD-10-CM | POA: Diagnosis not present

## 2020-01-05 DIAGNOSIS — Z87891 Personal history of nicotine dependence: Secondary | ICD-10-CM | POA: Diagnosis not present

## 2020-01-05 DIAGNOSIS — E039 Hypothyroidism, unspecified: Secondary | ICD-10-CM | POA: Diagnosis not present

## 2020-01-05 DIAGNOSIS — G479 Sleep disorder, unspecified: Secondary | ICD-10-CM | POA: Diagnosis not present

## 2020-01-13 ENCOUNTER — Telehealth: Payer: Self-pay

## 2020-01-13 ENCOUNTER — Telehealth: Payer: Self-pay | Admitting: Internal Medicine

## 2020-01-13 NOTE — Telephone Encounter (Signed)
Pt called requesting a refill of Ivermectin for scabies she recently got from [facility], discussed with pt she will need to return to the office for an appt, she can try otc itch relief creams until her appt January 4 with Dr Roseanne Reno

## 2020-01-13 NOTE — Telephone Encounter (Signed)
FYI: Spoke to pt to try and schedule her AWV with Kasey. She is confused if Dr. Jerrell Belfast still wants to keep her as a pt, as she received a letter regarding her no shows. Pt is going to call the main number and ask to speak with Amy.   Thanks

## 2020-01-14 DIAGNOSIS — I10 Essential (primary) hypertension: Secondary | ICD-10-CM | POA: Diagnosis not present

## 2020-01-14 DIAGNOSIS — E039 Hypothyroidism, unspecified: Secondary | ICD-10-CM | POA: Diagnosis not present

## 2020-01-14 DIAGNOSIS — K219 Gastro-esophageal reflux disease without esophagitis: Secondary | ICD-10-CM | POA: Diagnosis not present

## 2020-01-15 ENCOUNTER — Other Ambulatory Visit: Payer: Self-pay | Admitting: Dermatology

## 2020-01-20 ENCOUNTER — Ambulatory Visit: Payer: Medicare Other | Admitting: Dermatology

## 2020-01-22 ENCOUNTER — Telehealth: Payer: Self-pay

## 2020-01-22 NOTE — Telephone Encounter (Signed)
Patient called regarding missing appointment due to possible COVId due to current symptoms. She states she has a new outbreak of scabies from the [facility] ward and people not being clean or sanitary. Patient advised we can not RF any medications until she is seen. Patient states she does not live close anymore nor has transportation. Patient was given [facility] phone number and locations for something that is closer to her with more availability.

## 2020-01-24 DIAGNOSIS — J449 Chronic obstructive pulmonary disease, unspecified: Secondary | ICD-10-CM | POA: Diagnosis not present

## 2020-01-29 DIAGNOSIS — M2010 Hallux valgus (acquired), unspecified foot: Secondary | ICD-10-CM | POA: Diagnosis not present

## 2020-01-29 DIAGNOSIS — M25571 Pain in right ankle and joints of right foot: Secondary | ICD-10-CM | POA: Diagnosis not present

## 2020-01-29 DIAGNOSIS — M66879 Spontaneous rupture of other tendons, unspecified ankle and foot: Secondary | ICD-10-CM | POA: Diagnosis not present

## 2020-01-29 DIAGNOSIS — R600 Localized edema: Secondary | ICD-10-CM | POA: Diagnosis not present

## 2020-01-29 DIAGNOSIS — M2042 Other hammer toe(s) (acquired), left foot: Secondary | ICD-10-CM | POA: Diagnosis not present

## 2020-01-29 DIAGNOSIS — M25572 Pain in left ankle and joints of left foot: Secondary | ICD-10-CM | POA: Diagnosis not present

## 2020-01-29 DIAGNOSIS — M2041 Other hammer toe(s) (acquired), right foot: Secondary | ICD-10-CM | POA: Diagnosis not present

## 2020-02-03 DIAGNOSIS — H90A32 Mixed conductive and sensorineural hearing loss, unilateral, left ear with restricted hearing on the contralateral side: Secondary | ICD-10-CM | POA: Diagnosis not present

## 2020-02-03 DIAGNOSIS — H7291 Unspecified perforation of tympanic membrane, right ear: Secondary | ICD-10-CM | POA: Diagnosis not present

## 2020-02-09 DIAGNOSIS — R0689 Other abnormalities of breathing: Secondary | ICD-10-CM | POA: Diagnosis not present

## 2020-02-09 DIAGNOSIS — R634 Abnormal weight loss: Secondary | ICD-10-CM | POA: Diagnosis not present

## 2020-02-09 DIAGNOSIS — M6284 Sarcopenia: Secondary | ICD-10-CM | POA: Diagnosis not present

## 2020-02-09 DIAGNOSIS — Z9181 History of falling: Secondary | ICD-10-CM | POA: Diagnosis not present

## 2020-02-09 DIAGNOSIS — R29898 Other symptoms and signs involving the musculoskeletal system: Secondary | ICD-10-CM | POA: Diagnosis not present

## 2020-02-09 DIAGNOSIS — R54 Age-related physical debility: Secondary | ICD-10-CM | POA: Diagnosis not present

## 2020-02-11 ENCOUNTER — Encounter: Payer: Medicare Other | Admitting: Obstetrics and Gynecology

## 2020-02-17 ENCOUNTER — Encounter: Payer: Self-pay | Admitting: Obstetrics and Gynecology

## 2020-02-17 DIAGNOSIS — Z9181 History of falling: Secondary | ICD-10-CM | POA: Diagnosis not present

## 2020-02-17 DIAGNOSIS — J42 Unspecified chronic bronchitis: Secondary | ICD-10-CM | POA: Diagnosis not present

## 2020-02-17 DIAGNOSIS — R29898 Other symptoms and signs involving the musculoskeletal system: Secondary | ICD-10-CM | POA: Diagnosis not present

## 2020-02-17 DIAGNOSIS — R634 Abnormal weight loss: Secondary | ICD-10-CM | POA: Diagnosis not present

## 2020-02-17 DIAGNOSIS — M6284 Sarcopenia: Secondary | ICD-10-CM | POA: Diagnosis not present

## 2020-02-17 DIAGNOSIS — E039 Hypothyroidism, unspecified: Secondary | ICD-10-CM | POA: Diagnosis not present

## 2020-02-17 DIAGNOSIS — I10 Essential (primary) hypertension: Secondary | ICD-10-CM | POA: Diagnosis not present

## 2020-02-18 ENCOUNTER — Other Ambulatory Visit: Payer: Self-pay | Admitting: Dermatology

## 2020-02-18 DIAGNOSIS — E039 Hypothyroidism, unspecified: Secondary | ICD-10-CM | POA: Diagnosis not present

## 2020-02-18 DIAGNOSIS — M6284 Sarcopenia: Secondary | ICD-10-CM | POA: Diagnosis not present

## 2020-02-18 DIAGNOSIS — R29898 Other symptoms and signs involving the musculoskeletal system: Secondary | ICD-10-CM | POA: Diagnosis not present

## 2020-02-18 DIAGNOSIS — Z9181 History of falling: Secondary | ICD-10-CM | POA: Diagnosis not present

## 2020-02-18 DIAGNOSIS — R634 Abnormal weight loss: Secondary | ICD-10-CM | POA: Diagnosis not present

## 2020-02-18 DIAGNOSIS — I10 Essential (primary) hypertension: Secondary | ICD-10-CM | POA: Diagnosis not present

## 2020-02-18 DIAGNOSIS — J42 Unspecified chronic bronchitis: Secondary | ICD-10-CM | POA: Diagnosis not present

## 2020-02-20 DIAGNOSIS — Q6689 Other  specified congenital deformities of feet: Secondary | ICD-10-CM | POA: Diagnosis not present

## 2020-02-20 DIAGNOSIS — M25472 Effusion, left ankle: Secondary | ICD-10-CM | POA: Diagnosis not present

## 2020-02-20 DIAGNOSIS — R601 Generalized edema: Secondary | ICD-10-CM | POA: Diagnosis not present

## 2020-02-24 DIAGNOSIS — R29898 Other symptoms and signs involving the musculoskeletal system: Secondary | ICD-10-CM | POA: Diagnosis not present

## 2020-02-24 DIAGNOSIS — M67873 Other specified disorders of tendon, right ankle and foot: Secondary | ICD-10-CM | POA: Diagnosis not present

## 2020-02-24 DIAGNOSIS — I10 Essential (primary) hypertension: Secondary | ICD-10-CM | POA: Diagnosis not present

## 2020-02-24 DIAGNOSIS — J449 Chronic obstructive pulmonary disease, unspecified: Secondary | ICD-10-CM | POA: Diagnosis not present

## 2020-02-24 DIAGNOSIS — M6284 Sarcopenia: Secondary | ICD-10-CM | POA: Diagnosis not present

## 2020-02-24 DIAGNOSIS — E039 Hypothyroidism, unspecified: Secondary | ICD-10-CM | POA: Diagnosis not present

## 2020-02-24 DIAGNOSIS — M67961 Unspecified disorder of synovium and tendon, right lower leg: Secondary | ICD-10-CM | POA: Diagnosis not present

## 2020-02-24 DIAGNOSIS — R634 Abnormal weight loss: Secondary | ICD-10-CM | POA: Diagnosis not present

## 2020-02-24 DIAGNOSIS — Z9181 History of falling: Secondary | ICD-10-CM | POA: Diagnosis not present

## 2020-02-24 DIAGNOSIS — J42 Unspecified chronic bronchitis: Secondary | ICD-10-CM | POA: Diagnosis not present

## 2020-02-24 DIAGNOSIS — R601 Generalized edema: Secondary | ICD-10-CM | POA: Diagnosis not present

## 2020-02-26 DIAGNOSIS — E039 Hypothyroidism, unspecified: Secondary | ICD-10-CM | POA: Diagnosis not present

## 2020-02-26 DIAGNOSIS — M6284 Sarcopenia: Secondary | ICD-10-CM | POA: Diagnosis not present

## 2020-02-26 DIAGNOSIS — J42 Unspecified chronic bronchitis: Secondary | ICD-10-CM | POA: Diagnosis not present

## 2020-02-26 DIAGNOSIS — Z9181 History of falling: Secondary | ICD-10-CM | POA: Diagnosis not present

## 2020-02-26 DIAGNOSIS — R634 Abnormal weight loss: Secondary | ICD-10-CM | POA: Diagnosis not present

## 2020-02-26 DIAGNOSIS — R29898 Other symptoms and signs involving the musculoskeletal system: Secondary | ICD-10-CM | POA: Diagnosis not present

## 2020-02-26 DIAGNOSIS — I10 Essential (primary) hypertension: Secondary | ICD-10-CM | POA: Diagnosis not present

## 2020-02-27 DIAGNOSIS — M25571 Pain in right ankle and joints of right foot: Secondary | ICD-10-CM | POA: Diagnosis not present

## 2020-02-27 DIAGNOSIS — R262 Difficulty in walking, not elsewhere classified: Secondary | ICD-10-CM | POA: Diagnosis not present

## 2020-02-27 DIAGNOSIS — M25572 Pain in left ankle and joints of left foot: Secondary | ICD-10-CM | POA: Diagnosis not present

## 2020-02-27 DIAGNOSIS — R2689 Other abnormalities of gait and mobility: Secondary | ICD-10-CM | POA: Diagnosis not present

## 2020-02-27 DIAGNOSIS — G8929 Other chronic pain: Secondary | ICD-10-CM | POA: Diagnosis not present

## 2020-03-03 DIAGNOSIS — R634 Abnormal weight loss: Secondary | ICD-10-CM | POA: Diagnosis not present

## 2020-03-03 DIAGNOSIS — Z9181 History of falling: Secondary | ICD-10-CM | POA: Diagnosis not present

## 2020-03-03 DIAGNOSIS — J42 Unspecified chronic bronchitis: Secondary | ICD-10-CM | POA: Diagnosis not present

## 2020-03-03 DIAGNOSIS — M6284 Sarcopenia: Secondary | ICD-10-CM | POA: Diagnosis not present

## 2020-03-03 DIAGNOSIS — I10 Essential (primary) hypertension: Secondary | ICD-10-CM | POA: Diagnosis not present

## 2020-03-03 DIAGNOSIS — R29898 Other symptoms and signs involving the musculoskeletal system: Secondary | ICD-10-CM | POA: Diagnosis not present

## 2020-03-03 DIAGNOSIS — E039 Hypothyroidism, unspecified: Secondary | ICD-10-CM | POA: Diagnosis not present

## 2020-03-08 DIAGNOSIS — R296 Repeated falls: Secondary | ICD-10-CM | POA: Diagnosis not present

## 2020-03-08 DIAGNOSIS — Z880 Allergy status to penicillin: Secondary | ICD-10-CM | POA: Diagnosis not present

## 2020-03-08 DIAGNOSIS — I4510 Unspecified right bundle-branch block: Secondary | ICD-10-CM | POA: Diagnosis not present

## 2020-03-08 DIAGNOSIS — E785 Hyperlipidemia, unspecified: Secondary | ICD-10-CM | POA: Diagnosis not present

## 2020-03-08 DIAGNOSIS — K449 Diaphragmatic hernia without obstruction or gangrene: Secondary | ICD-10-CM | POA: Diagnosis not present

## 2020-03-08 DIAGNOSIS — E46 Unspecified protein-calorie malnutrition: Secondary | ICD-10-CM | POA: Diagnosis not present

## 2020-03-08 DIAGNOSIS — Z791 Long term (current) use of non-steroidal anti-inflammatories (NSAID): Secondary | ICD-10-CM | POA: Diagnosis not present

## 2020-03-08 DIAGNOSIS — R531 Weakness: Secondary | ICD-10-CM | POA: Diagnosis not present

## 2020-03-08 DIAGNOSIS — R001 Bradycardia, unspecified: Secondary | ICD-10-CM | POA: Diagnosis not present

## 2020-03-08 DIAGNOSIS — R58 Hemorrhage, not elsewhere classified: Secondary | ICD-10-CM | POA: Diagnosis not present

## 2020-03-08 DIAGNOSIS — I10 Essential (primary) hypertension: Secondary | ICD-10-CM | POA: Diagnosis not present

## 2020-03-08 DIAGNOSIS — J449 Chronic obstructive pulmonary disease, unspecified: Secondary | ICD-10-CM | POA: Diagnosis not present

## 2020-03-08 DIAGNOSIS — S0990XA Unspecified injury of head, initial encounter: Secondary | ICD-10-CM | POA: Diagnosis not present

## 2020-03-08 DIAGNOSIS — R55 Syncope and collapse: Secondary | ICD-10-CM | POA: Diagnosis not present

## 2020-03-08 DIAGNOSIS — S8002XA Contusion of left knee, initial encounter: Secondary | ICD-10-CM | POA: Diagnosis not present

## 2020-03-08 DIAGNOSIS — M25461 Effusion, right knee: Secondary | ICD-10-CM | POA: Diagnosis not present

## 2020-03-08 DIAGNOSIS — S8001XA Contusion of right knee, initial encounter: Secondary | ICD-10-CM | POA: Diagnosis not present

## 2020-03-08 DIAGNOSIS — M47812 Spondylosis without myelopathy or radiculopathy, cervical region: Secondary | ICD-10-CM | POA: Diagnosis not present

## 2020-03-08 DIAGNOSIS — S299XXA Unspecified injury of thorax, initial encounter: Secondary | ICD-10-CM | POA: Diagnosis not present

## 2020-03-08 DIAGNOSIS — E039 Hypothyroidism, unspecified: Secondary | ICD-10-CM | POA: Diagnosis not present

## 2020-03-08 DIAGNOSIS — Z743 Need for continuous supervision: Secondary | ICD-10-CM | POA: Diagnosis not present

## 2020-03-08 DIAGNOSIS — E222 Syndrome of inappropriate secretion of antidiuretic hormone: Secondary | ICD-10-CM | POA: Diagnosis not present

## 2020-03-08 DIAGNOSIS — Z20822 Contact with and (suspected) exposure to covid-19: Secondary | ICD-10-CM | POA: Diagnosis not present

## 2020-03-08 DIAGNOSIS — M542 Cervicalgia: Secondary | ICD-10-CM | POA: Diagnosis not present

## 2020-03-08 DIAGNOSIS — E7800 Pure hypercholesterolemia, unspecified: Secondary | ICD-10-CM | POA: Diagnosis not present

## 2020-03-08 DIAGNOSIS — R519 Headache, unspecified: Secondary | ICD-10-CM | POA: Diagnosis not present

## 2020-03-08 DIAGNOSIS — R6889 Other general symptoms and signs: Secondary | ICD-10-CM | POA: Diagnosis not present

## 2020-03-08 DIAGNOSIS — I272 Pulmonary hypertension, unspecified: Secondary | ICD-10-CM | POA: Diagnosis not present

## 2020-03-08 DIAGNOSIS — R2243 Localized swelling, mass and lump, lower limb, bilateral: Secondary | ICD-10-CM | POA: Diagnosis not present

## 2020-03-08 DIAGNOSIS — W19XXXA Unspecified fall, initial encounter: Secondary | ICD-10-CM | POA: Diagnosis not present

## 2020-03-08 DIAGNOSIS — Z79899 Other long term (current) drug therapy: Secondary | ICD-10-CM | POA: Diagnosis not present

## 2020-03-08 DIAGNOSIS — M25511 Pain in right shoulder: Secondary | ICD-10-CM | POA: Diagnosis not present

## 2020-03-08 DIAGNOSIS — M25462 Effusion, left knee: Secondary | ICD-10-CM | POA: Diagnosis not present

## 2020-03-09 DIAGNOSIS — W19XXXA Unspecified fall, initial encounter: Secondary | ICD-10-CM | POA: Diagnosis not present

## 2020-03-09 DIAGNOSIS — E039 Hypothyroidism, unspecified: Secondary | ICD-10-CM | POA: Diagnosis not present

## 2020-03-09 DIAGNOSIS — I10 Essential (primary) hypertension: Secondary | ICD-10-CM | POA: Diagnosis not present

## 2020-03-09 DIAGNOSIS — S0511XA Contusion of eyeball and orbital tissues, right eye, initial encounter: Secondary | ICD-10-CM | POA: Diagnosis not present

## 2020-03-09 DIAGNOSIS — S8002XA Contusion of left knee, initial encounter: Secondary | ICD-10-CM | POA: Diagnosis not present

## 2020-03-09 DIAGNOSIS — S8001XA Contusion of right knee, initial encounter: Secondary | ICD-10-CM | POA: Diagnosis not present

## 2020-03-09 DIAGNOSIS — I272 Pulmonary hypertension, unspecified: Secondary | ICD-10-CM | POA: Diagnosis not present

## 2020-03-09 DIAGNOSIS — R55 Syncope and collapse: Secondary | ICD-10-CM | POA: Diagnosis not present

## 2020-03-09 DIAGNOSIS — R9439 Abnormal result of other cardiovascular function study: Secondary | ICD-10-CM | POA: Diagnosis not present

## 2020-03-09 DIAGNOSIS — I361 Nonrheumatic tricuspid (valve) insufficiency: Secondary | ICD-10-CM | POA: Diagnosis not present

## 2020-03-10 DIAGNOSIS — S0511XA Contusion of eyeball and orbital tissues, right eye, initial encounter: Secondary | ICD-10-CM | POA: Diagnosis not present

## 2020-03-10 DIAGNOSIS — W19XXXA Unspecified fall, initial encounter: Secondary | ICD-10-CM | POA: Diagnosis not present

## 2020-03-10 DIAGNOSIS — R55 Syncope and collapse: Secondary | ICD-10-CM | POA: Diagnosis not present

## 2020-03-10 DIAGNOSIS — M47812 Spondylosis without myelopathy or radiculopathy, cervical region: Secondary | ICD-10-CM | POA: Diagnosis not present

## 2020-03-10 DIAGNOSIS — I10 Essential (primary) hypertension: Secondary | ICD-10-CM | POA: Diagnosis not present

## 2020-03-10 DIAGNOSIS — S8001XA Contusion of right knee, initial encounter: Secondary | ICD-10-CM | POA: Diagnosis not present

## 2020-03-11 DIAGNOSIS — R55 Syncope and collapse: Secondary | ICD-10-CM | POA: Diagnosis not present

## 2020-03-11 DIAGNOSIS — S0511XA Contusion of eyeball and orbital tissues, right eye, initial encounter: Secondary | ICD-10-CM | POA: Diagnosis not present

## 2020-03-11 DIAGNOSIS — W19XXXA Unspecified fall, initial encounter: Secondary | ICD-10-CM | POA: Diagnosis not present

## 2020-03-11 DIAGNOSIS — I10 Essential (primary) hypertension: Secondary | ICD-10-CM | POA: Diagnosis not present

## 2020-03-12 DIAGNOSIS — M6259 Muscle wasting and atrophy, not elsewhere classified, multiple sites: Secondary | ICD-10-CM | POA: Diagnosis not present

## 2020-03-12 DIAGNOSIS — Z880 Allergy status to penicillin: Secondary | ICD-10-CM | POA: Diagnosis not present

## 2020-03-12 DIAGNOSIS — S0011XA Contusion of right eyelid and periocular area, initial encounter: Secondary | ICD-10-CM | POA: Diagnosis not present

## 2020-03-12 DIAGNOSIS — R55 Syncope and collapse: Secondary | ICD-10-CM | POA: Diagnosis not present

## 2020-03-12 DIAGNOSIS — M71561 Other bursitis, not elsewhere classified, right knee: Secondary | ICD-10-CM | POA: Diagnosis not present

## 2020-03-12 DIAGNOSIS — K449 Diaphragmatic hernia without obstruction or gangrene: Secondary | ICD-10-CM | POA: Diagnosis not present

## 2020-03-12 DIAGNOSIS — Z791 Long term (current) use of non-steroidal anti-inflammatories (NSAID): Secondary | ICD-10-CM | POA: Diagnosis not present

## 2020-03-12 DIAGNOSIS — S93401A Sprain of unspecified ligament of right ankle, initial encounter: Secondary | ICD-10-CM | POA: Diagnosis not present

## 2020-03-12 DIAGNOSIS — J449 Chronic obstructive pulmonary disease, unspecified: Secondary | ICD-10-CM | POA: Diagnosis not present

## 2020-03-12 DIAGNOSIS — S8002XA Contusion of left knee, initial encounter: Secondary | ICD-10-CM | POA: Diagnosis not present

## 2020-03-12 DIAGNOSIS — Z882 Allergy status to sulfonamides status: Secondary | ICD-10-CM | POA: Diagnosis not present

## 2020-03-12 DIAGNOSIS — R001 Bradycardia, unspecified: Secondary | ICD-10-CM | POA: Diagnosis not present

## 2020-03-12 DIAGNOSIS — E785 Hyperlipidemia, unspecified: Secondary | ICD-10-CM | POA: Diagnosis not present

## 2020-03-12 DIAGNOSIS — M47812 Spondylosis without myelopathy or radiculopathy, cervical region: Secondary | ICD-10-CM | POA: Diagnosis not present

## 2020-03-12 DIAGNOSIS — R52 Pain, unspecified: Secondary | ICD-10-CM | POA: Diagnosis not present

## 2020-03-12 DIAGNOSIS — M25561 Pain in right knee: Secondary | ICD-10-CM | POA: Diagnosis not present

## 2020-03-12 DIAGNOSIS — E7800 Pure hypercholesterolemia, unspecified: Secondary | ICD-10-CM | POA: Diagnosis not present

## 2020-03-12 DIAGNOSIS — W19XXXA Unspecified fall, initial encounter: Secondary | ICD-10-CM | POA: Diagnosis not present

## 2020-03-12 DIAGNOSIS — Z91018 Allergy to other foods: Secondary | ICD-10-CM | POA: Diagnosis not present

## 2020-03-12 DIAGNOSIS — M1711 Unilateral primary osteoarthritis, right knee: Secondary | ICD-10-CM | POA: Diagnosis not present

## 2020-03-12 DIAGNOSIS — R262 Difficulty in walking, not elsewhere classified: Secondary | ICD-10-CM | POA: Diagnosis not present

## 2020-03-12 DIAGNOSIS — I4510 Unspecified right bundle-branch block: Secondary | ICD-10-CM | POA: Diagnosis not present

## 2020-03-12 DIAGNOSIS — M25461 Effusion, right knee: Secondary | ICD-10-CM | POA: Diagnosis not present

## 2020-03-12 DIAGNOSIS — R2689 Other abnormalities of gait and mobility: Secondary | ICD-10-CM | POA: Diagnosis not present

## 2020-03-12 DIAGNOSIS — R5381 Other malaise: Secondary | ICD-10-CM | POA: Diagnosis not present

## 2020-03-12 DIAGNOSIS — R1310 Dysphagia, unspecified: Secondary | ICD-10-CM | POA: Diagnosis not present

## 2020-03-12 DIAGNOSIS — Z20822 Contact with and (suspected) exposure to covid-19: Secondary | ICD-10-CM | POA: Diagnosis not present

## 2020-03-12 DIAGNOSIS — R296 Repeated falls: Secondary | ICD-10-CM | POA: Diagnosis not present

## 2020-03-12 DIAGNOSIS — Z7409 Other reduced mobility: Secondary | ICD-10-CM | POA: Diagnosis not present

## 2020-03-12 DIAGNOSIS — Z888 Allergy status to other drugs, medicaments and biological substances status: Secondary | ICD-10-CM | POA: Diagnosis not present

## 2020-03-12 DIAGNOSIS — Z886 Allergy status to analgesic agent status: Secondary | ICD-10-CM | POA: Diagnosis not present

## 2020-03-12 DIAGNOSIS — I10 Essential (primary) hypertension: Secondary | ICD-10-CM | POA: Diagnosis not present

## 2020-03-12 DIAGNOSIS — S0990XA Unspecified injury of head, initial encounter: Secondary | ICD-10-CM | POA: Diagnosis not present

## 2020-03-12 DIAGNOSIS — E039 Hypothyroidism, unspecified: Secondary | ICD-10-CM | POA: Diagnosis not present

## 2020-03-12 DIAGNOSIS — Z885 Allergy status to narcotic agent status: Secondary | ICD-10-CM | POA: Diagnosis not present

## 2020-03-12 DIAGNOSIS — E46 Unspecified protein-calorie malnutrition: Secondary | ICD-10-CM | POA: Diagnosis not present

## 2020-03-12 DIAGNOSIS — G629 Polyneuropathy, unspecified: Secondary | ICD-10-CM | POA: Diagnosis not present

## 2020-03-12 DIAGNOSIS — Z79899 Other long term (current) drug therapy: Secondary | ICD-10-CM | POA: Diagnosis not present

## 2020-03-12 DIAGNOSIS — M25462 Effusion, left knee: Secondary | ICD-10-CM | POA: Diagnosis not present

## 2020-03-12 DIAGNOSIS — Z881 Allergy status to other antibiotic agents status: Secondary | ICD-10-CM | POA: Diagnosis not present

## 2020-03-12 DIAGNOSIS — S8001XA Contusion of right knee, initial encounter: Secondary | ICD-10-CM | POA: Diagnosis not present

## 2020-03-12 DIAGNOSIS — G8929 Other chronic pain: Secondary | ICD-10-CM | POA: Diagnosis not present

## 2020-03-12 DIAGNOSIS — R0902 Hypoxemia: Secondary | ICD-10-CM | POA: Diagnosis not present

## 2020-03-12 DIAGNOSIS — M25562 Pain in left knee: Secondary | ICD-10-CM | POA: Diagnosis not present

## 2020-03-12 DIAGNOSIS — Z91010 Allergy to peanuts: Secondary | ICD-10-CM | POA: Diagnosis not present

## 2020-03-12 DIAGNOSIS — M25571 Pain in right ankle and joints of right foot: Secondary | ICD-10-CM | POA: Diagnosis not present

## 2020-03-12 DIAGNOSIS — M25572 Pain in left ankle and joints of left foot: Secondary | ICD-10-CM | POA: Diagnosis not present

## 2020-03-12 DIAGNOSIS — Z9109 Other allergy status, other than to drugs and biological substances: Secondary | ICD-10-CM | POA: Diagnosis not present

## 2020-03-12 DIAGNOSIS — I272 Pulmonary hypertension, unspecified: Secondary | ICD-10-CM | POA: Diagnosis not present

## 2020-03-12 DIAGNOSIS — E222 Syndrome of inappropriate secretion of antidiuretic hormone: Secondary | ICD-10-CM | POA: Diagnosis not present

## 2020-03-16 DIAGNOSIS — I10 Essential (primary) hypertension: Secondary | ICD-10-CM | POA: Diagnosis not present

## 2020-03-16 DIAGNOSIS — R55 Syncope and collapse: Secondary | ICD-10-CM | POA: Diagnosis not present

## 2020-03-16 DIAGNOSIS — E039 Hypothyroidism, unspecified: Secondary | ICD-10-CM | POA: Diagnosis not present

## 2020-03-18 DIAGNOSIS — M25571 Pain in right ankle and joints of right foot: Secondary | ICD-10-CM | POA: Diagnosis not present

## 2020-03-18 DIAGNOSIS — G629 Polyneuropathy, unspecified: Secondary | ICD-10-CM | POA: Diagnosis not present

## 2020-03-18 DIAGNOSIS — Z9109 Other allergy status, other than to drugs and biological substances: Secondary | ICD-10-CM | POA: Diagnosis not present

## 2020-03-18 DIAGNOSIS — Z20822 Contact with and (suspected) exposure to covid-19: Secondary | ICD-10-CM | POA: Diagnosis not present

## 2020-03-18 DIAGNOSIS — Z885 Allergy status to narcotic agent status: Secondary | ICD-10-CM | POA: Diagnosis not present

## 2020-03-18 DIAGNOSIS — Z880 Allergy status to penicillin: Secondary | ICD-10-CM | POA: Diagnosis not present

## 2020-03-18 DIAGNOSIS — M25572 Pain in left ankle and joints of left foot: Secondary | ICD-10-CM | POA: Diagnosis not present

## 2020-03-18 DIAGNOSIS — Z888 Allergy status to other drugs, medicaments and biological substances status: Secondary | ICD-10-CM | POA: Diagnosis not present

## 2020-03-18 DIAGNOSIS — E039 Hypothyroidism, unspecified: Secondary | ICD-10-CM | POA: Diagnosis not present

## 2020-03-18 DIAGNOSIS — M25561 Pain in right knee: Secondary | ICD-10-CM | POA: Diagnosis not present

## 2020-03-18 DIAGNOSIS — M1711 Unilateral primary osteoarthritis, right knee: Secondary | ICD-10-CM | POA: Diagnosis not present

## 2020-03-18 DIAGNOSIS — Z91010 Allergy to peanuts: Secondary | ICD-10-CM | POA: Diagnosis not present

## 2020-03-18 DIAGNOSIS — Z886 Allergy status to analgesic agent status: Secondary | ICD-10-CM | POA: Diagnosis not present

## 2020-03-18 DIAGNOSIS — G8929 Other chronic pain: Secondary | ICD-10-CM | POA: Diagnosis not present

## 2020-03-18 DIAGNOSIS — S8001XA Contusion of right knee, initial encounter: Secondary | ICD-10-CM | POA: Diagnosis not present

## 2020-03-18 DIAGNOSIS — S93401A Sprain of unspecified ligament of right ankle, initial encounter: Secondary | ICD-10-CM | POA: Diagnosis not present

## 2020-03-18 DIAGNOSIS — Z881 Allergy status to other antibiotic agents status: Secondary | ICD-10-CM | POA: Diagnosis not present

## 2020-03-18 DIAGNOSIS — M71561 Other bursitis, not elsewhere classified, right knee: Secondary | ICD-10-CM | POA: Diagnosis not present

## 2020-03-18 DIAGNOSIS — I10 Essential (primary) hypertension: Secondary | ICD-10-CM | POA: Diagnosis not present

## 2020-03-18 DIAGNOSIS — M25562 Pain in left knee: Secondary | ICD-10-CM | POA: Diagnosis not present

## 2020-03-18 DIAGNOSIS — R2689 Other abnormalities of gait and mobility: Secondary | ICD-10-CM | POA: Diagnosis not present

## 2020-03-18 DIAGNOSIS — Z91018 Allergy to other foods: Secondary | ICD-10-CM | POA: Diagnosis not present

## 2020-03-18 DIAGNOSIS — Z7409 Other reduced mobility: Secondary | ICD-10-CM | POA: Diagnosis not present

## 2020-03-18 DIAGNOSIS — R55 Syncope and collapse: Secondary | ICD-10-CM | POA: Diagnosis not present

## 2020-03-18 DIAGNOSIS — Z882 Allergy status to sulfonamides status: Secondary | ICD-10-CM | POA: Diagnosis not present

## 2020-03-18 DIAGNOSIS — Z79899 Other long term (current) drug therapy: Secondary | ICD-10-CM | POA: Diagnosis not present

## 2020-03-19 DIAGNOSIS — M25561 Pain in right knee: Secondary | ICD-10-CM | POA: Diagnosis not present

## 2020-03-19 DIAGNOSIS — Z20822 Contact with and (suspected) exposure to covid-19: Secondary | ICD-10-CM | POA: Diagnosis not present

## 2020-03-20 DIAGNOSIS — I10 Essential (primary) hypertension: Secondary | ICD-10-CM | POA: Diagnosis not present

## 2020-03-20 DIAGNOSIS — S8001XA Contusion of right knee, initial encounter: Secondary | ICD-10-CM | POA: Diagnosis not present

## 2020-03-20 DIAGNOSIS — W19XXXA Unspecified fall, initial encounter: Secondary | ICD-10-CM | POA: Diagnosis not present

## 2020-03-20 DIAGNOSIS — M25561 Pain in right knee: Secondary | ICD-10-CM | POA: Diagnosis not present

## 2020-03-20 DIAGNOSIS — W19XXXD Unspecified fall, subsequent encounter: Secondary | ICD-10-CM | POA: Diagnosis not present

## 2020-03-21 DIAGNOSIS — M25561 Pain in right knee: Secondary | ICD-10-CM | POA: Diagnosis not present

## 2020-03-21 DIAGNOSIS — I10 Essential (primary) hypertension: Secondary | ICD-10-CM | POA: Diagnosis not present

## 2020-03-21 DIAGNOSIS — W19XXXD Unspecified fall, subsequent encounter: Secondary | ICD-10-CM | POA: Diagnosis not present

## 2020-03-22 DIAGNOSIS — E039 Hypothyroidism, unspecified: Secondary | ICD-10-CM | POA: Diagnosis not present

## 2020-03-22 DIAGNOSIS — M25571 Pain in right ankle and joints of right foot: Secondary | ICD-10-CM | POA: Diagnosis not present

## 2020-03-22 DIAGNOSIS — S8001XD Contusion of right knee, subsequent encounter: Secondary | ICD-10-CM | POA: Diagnosis not present

## 2020-03-22 DIAGNOSIS — G8929 Other chronic pain: Secondary | ICD-10-CM | POA: Diagnosis not present

## 2020-03-22 DIAGNOSIS — M25572 Pain in left ankle and joints of left foot: Secondary | ICD-10-CM | POA: Diagnosis not present

## 2020-03-22 DIAGNOSIS — M25561 Pain in right knee: Secondary | ICD-10-CM | POA: Diagnosis not present

## 2020-03-22 DIAGNOSIS — I10 Essential (primary) hypertension: Secondary | ICD-10-CM | POA: Diagnosis not present

## 2020-03-22 DIAGNOSIS — W19XXXD Unspecified fall, subsequent encounter: Secondary | ICD-10-CM | POA: Diagnosis not present

## 2020-03-23 DIAGNOSIS — J449 Chronic obstructive pulmonary disease, unspecified: Secondary | ICD-10-CM | POA: Diagnosis not present

## 2020-03-23 DIAGNOSIS — M25571 Pain in right ankle and joints of right foot: Secondary | ICD-10-CM | POA: Diagnosis not present

## 2020-03-23 DIAGNOSIS — M25561 Pain in right knee: Secondary | ICD-10-CM | POA: Diagnosis not present

## 2020-03-23 DIAGNOSIS — S8001XD Contusion of right knee, subsequent encounter: Secondary | ICD-10-CM | POA: Diagnosis not present

## 2020-03-23 DIAGNOSIS — I10 Essential (primary) hypertension: Secondary | ICD-10-CM | POA: Diagnosis not present

## 2020-03-24 DIAGNOSIS — S8001XS Contusion of right knee, sequela: Secondary | ICD-10-CM | POA: Diagnosis not present

## 2020-03-24 DIAGNOSIS — M25561 Pain in right knee: Secondary | ICD-10-CM | POA: Diagnosis not present

## 2020-03-24 DIAGNOSIS — W19XXXD Unspecified fall, subsequent encounter: Secondary | ICD-10-CM | POA: Diagnosis not present

## 2020-03-24 DIAGNOSIS — S8011XS Contusion of right lower leg, sequela: Secondary | ICD-10-CM | POA: Diagnosis not present

## 2020-03-24 DIAGNOSIS — R55 Syncope and collapse: Secondary | ICD-10-CM | POA: Diagnosis not present

## 2020-03-24 DIAGNOSIS — G63 Polyneuropathy in diseases classified elsewhere: Secondary | ICD-10-CM | POA: Diagnosis not present

## 2020-03-25 DIAGNOSIS — S8001XS Contusion of right knee, sequela: Secondary | ICD-10-CM | POA: Diagnosis not present

## 2020-03-25 DIAGNOSIS — M25561 Pain in right knee: Secondary | ICD-10-CM | POA: Diagnosis not present

## 2020-03-30 DIAGNOSIS — D509 Iron deficiency anemia, unspecified: Secondary | ICD-10-CM | POA: Diagnosis not present

## 2020-03-30 DIAGNOSIS — S8001XD Contusion of right knee, subsequent encounter: Secondary | ICD-10-CM | POA: Diagnosis not present

## 2020-03-30 DIAGNOSIS — J449 Chronic obstructive pulmonary disease, unspecified: Secondary | ICD-10-CM | POA: Diagnosis not present

## 2020-03-30 DIAGNOSIS — T798XXD Other early complications of trauma, subsequent encounter: Secondary | ICD-10-CM | POA: Diagnosis not present

## 2020-03-30 DIAGNOSIS — E039 Hypothyroidism, unspecified: Secondary | ICD-10-CM | POA: Diagnosis not present

## 2020-03-30 DIAGNOSIS — E538 Deficiency of other specified B group vitamins: Secondary | ICD-10-CM | POA: Diagnosis not present

## 2020-03-30 DIAGNOSIS — G4700 Insomnia, unspecified: Secondary | ICD-10-CM | POA: Diagnosis not present

## 2020-03-30 DIAGNOSIS — Z8673 Personal history of transient ischemic attack (TIA), and cerebral infarction without residual deficits: Secondary | ICD-10-CM | POA: Diagnosis not present

## 2020-03-30 DIAGNOSIS — I340 Nonrheumatic mitral (valve) insufficiency: Secondary | ICD-10-CM | POA: Diagnosis not present

## 2020-03-30 DIAGNOSIS — G629 Polyneuropathy, unspecified: Secondary | ICD-10-CM | POA: Diagnosis not present

## 2020-03-30 DIAGNOSIS — K219 Gastro-esophageal reflux disease without esophagitis: Secondary | ICD-10-CM | POA: Diagnosis not present

## 2020-03-30 DIAGNOSIS — H906 Mixed conductive and sensorineural hearing loss, bilateral: Secondary | ICD-10-CM | POA: Diagnosis not present

## 2020-03-30 DIAGNOSIS — M1990 Unspecified osteoarthritis, unspecified site: Secondary | ICD-10-CM | POA: Diagnosis not present

## 2020-03-30 DIAGNOSIS — M50122 Cervical disc disorder at C5-C6 level with radiculopathy: Secondary | ICD-10-CM | POA: Diagnosis not present

## 2020-03-30 DIAGNOSIS — I10 Essential (primary) hypertension: Secondary | ICD-10-CM | POA: Diagnosis not present

## 2020-03-30 DIAGNOSIS — E559 Vitamin D deficiency, unspecified: Secondary | ICD-10-CM | POA: Diagnosis not present

## 2020-03-30 DIAGNOSIS — J309 Allergic rhinitis, unspecified: Secondary | ICD-10-CM | POA: Diagnosis not present

## 2020-03-30 DIAGNOSIS — G43909 Migraine, unspecified, not intractable, without status migrainosus: Secondary | ICD-10-CM | POA: Diagnosis not present

## 2020-03-30 DIAGNOSIS — Z9181 History of falling: Secondary | ICD-10-CM | POA: Diagnosis not present

## 2020-03-30 DIAGNOSIS — I700 Atherosclerosis of aorta: Secondary | ICD-10-CM | POA: Diagnosis not present

## 2020-04-01 DIAGNOSIS — D509 Iron deficiency anemia, unspecified: Secondary | ICD-10-CM | POA: Diagnosis not present

## 2020-04-01 DIAGNOSIS — T798XXD Other early complications of trauma, subsequent encounter: Secondary | ICD-10-CM | POA: Diagnosis not present

## 2020-04-01 DIAGNOSIS — S8001XD Contusion of right knee, subsequent encounter: Secondary | ICD-10-CM | POA: Diagnosis not present

## 2020-04-01 DIAGNOSIS — E538 Deficiency of other specified B group vitamins: Secondary | ICD-10-CM | POA: Diagnosis not present

## 2020-04-01 DIAGNOSIS — I340 Nonrheumatic mitral (valve) insufficiency: Secondary | ICD-10-CM | POA: Diagnosis not present

## 2020-04-01 DIAGNOSIS — J309 Allergic rhinitis, unspecified: Secondary | ICD-10-CM | POA: Diagnosis not present

## 2020-04-01 DIAGNOSIS — E039 Hypothyroidism, unspecified: Secondary | ICD-10-CM | POA: Diagnosis not present

## 2020-04-01 DIAGNOSIS — K219 Gastro-esophageal reflux disease without esophagitis: Secondary | ICD-10-CM | POA: Diagnosis not present

## 2020-04-01 DIAGNOSIS — M50122 Cervical disc disorder at C5-C6 level with radiculopathy: Secondary | ICD-10-CM | POA: Diagnosis not present

## 2020-04-01 DIAGNOSIS — I10 Essential (primary) hypertension: Secondary | ICD-10-CM | POA: Diagnosis not present

## 2020-04-01 DIAGNOSIS — E559 Vitamin D deficiency, unspecified: Secondary | ICD-10-CM | POA: Diagnosis not present

## 2020-04-01 DIAGNOSIS — G43909 Migraine, unspecified, not intractable, without status migrainosus: Secondary | ICD-10-CM | POA: Diagnosis not present

## 2020-04-01 DIAGNOSIS — I700 Atherosclerosis of aorta: Secondary | ICD-10-CM | POA: Diagnosis not present

## 2020-04-01 DIAGNOSIS — Z8673 Personal history of transient ischemic attack (TIA), and cerebral infarction without residual deficits: Secondary | ICD-10-CM | POA: Diagnosis not present

## 2020-04-01 DIAGNOSIS — J449 Chronic obstructive pulmonary disease, unspecified: Secondary | ICD-10-CM | POA: Diagnosis not present

## 2020-04-01 DIAGNOSIS — G4700 Insomnia, unspecified: Secondary | ICD-10-CM | POA: Diagnosis not present

## 2020-04-01 DIAGNOSIS — M1990 Unspecified osteoarthritis, unspecified site: Secondary | ICD-10-CM | POA: Diagnosis not present

## 2020-04-01 DIAGNOSIS — Z9181 History of falling: Secondary | ICD-10-CM | POA: Diagnosis not present

## 2020-04-01 DIAGNOSIS — H906 Mixed conductive and sensorineural hearing loss, bilateral: Secondary | ICD-10-CM | POA: Diagnosis not present

## 2020-04-01 DIAGNOSIS — G629 Polyneuropathy, unspecified: Secondary | ICD-10-CM | POA: Diagnosis not present

## 2020-04-05 ENCOUNTER — Telehealth: Payer: Self-pay | Admitting: Cardiovascular Disease

## 2020-04-05 DIAGNOSIS — K219 Gastro-esophageal reflux disease without esophagitis: Secondary | ICD-10-CM | POA: Diagnosis not present

## 2020-04-05 DIAGNOSIS — G43909 Migraine, unspecified, not intractable, without status migrainosus: Secondary | ICD-10-CM | POA: Diagnosis not present

## 2020-04-05 DIAGNOSIS — Z8673 Personal history of transient ischemic attack (TIA), and cerebral infarction without residual deficits: Secondary | ICD-10-CM | POA: Diagnosis not present

## 2020-04-05 DIAGNOSIS — G629 Polyneuropathy, unspecified: Secondary | ICD-10-CM | POA: Diagnosis not present

## 2020-04-05 DIAGNOSIS — T798XXD Other early complications of trauma, subsequent encounter: Secondary | ICD-10-CM | POA: Diagnosis not present

## 2020-04-05 DIAGNOSIS — Z9181 History of falling: Secondary | ICD-10-CM | POA: Diagnosis not present

## 2020-04-05 DIAGNOSIS — J309 Allergic rhinitis, unspecified: Secondary | ICD-10-CM | POA: Diagnosis not present

## 2020-04-05 DIAGNOSIS — I340 Nonrheumatic mitral (valve) insufficiency: Secondary | ICD-10-CM | POA: Diagnosis not present

## 2020-04-05 DIAGNOSIS — M50122 Cervical disc disorder at C5-C6 level with radiculopathy: Secondary | ICD-10-CM | POA: Diagnosis not present

## 2020-04-05 DIAGNOSIS — E559 Vitamin D deficiency, unspecified: Secondary | ICD-10-CM | POA: Diagnosis not present

## 2020-04-05 DIAGNOSIS — G4700 Insomnia, unspecified: Secondary | ICD-10-CM | POA: Diagnosis not present

## 2020-04-05 DIAGNOSIS — D509 Iron deficiency anemia, unspecified: Secondary | ICD-10-CM | POA: Diagnosis not present

## 2020-04-05 DIAGNOSIS — E039 Hypothyroidism, unspecified: Secondary | ICD-10-CM | POA: Diagnosis not present

## 2020-04-05 DIAGNOSIS — I700 Atherosclerosis of aorta: Secondary | ICD-10-CM | POA: Diagnosis not present

## 2020-04-05 DIAGNOSIS — S8001XD Contusion of right knee, subsequent encounter: Secondary | ICD-10-CM | POA: Diagnosis not present

## 2020-04-05 DIAGNOSIS — I10 Essential (primary) hypertension: Secondary | ICD-10-CM | POA: Diagnosis not present

## 2020-04-05 DIAGNOSIS — J449 Chronic obstructive pulmonary disease, unspecified: Secondary | ICD-10-CM | POA: Diagnosis not present

## 2020-04-05 DIAGNOSIS — H906 Mixed conductive and sensorineural hearing loss, bilateral: Secondary | ICD-10-CM | POA: Diagnosis not present

## 2020-04-05 DIAGNOSIS — E538 Deficiency of other specified B group vitamins: Secondary | ICD-10-CM | POA: Diagnosis not present

## 2020-04-05 DIAGNOSIS — M1990 Unspecified osteoarthritis, unspecified site: Secondary | ICD-10-CM | POA: Diagnosis not present

## 2020-04-05 NOTE — Telephone Encounter (Signed)
Pt c/o BP issue: STAT if pt c/o blurred vision, one-sided weakness or slurred speech  1. What are your last 5 BP readings?  179/98 109/50   2. Are you having any other symptoms (ex. Dizziness, headache, blurred vision, passed out)? Passed out   3. What is your BP issue? Patient calling, states that she would like to discuss BP issues.  Has also been seen by [facility] where she wore a heart monitor.  Patient requesting appt with Dr Rockey Situ, offered first available 04/04 but patient wants sooner, declining APP.  Please call to discuss.
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
## 2020-04-08 DIAGNOSIS — Z8673 Personal history of transient ischemic attack (TIA), and cerebral infarction without residual deficits: Secondary | ICD-10-CM | POA: Diagnosis not present

## 2020-04-08 DIAGNOSIS — M1990 Unspecified osteoarthritis, unspecified site: Secondary | ICD-10-CM | POA: Diagnosis not present

## 2020-04-08 DIAGNOSIS — E559 Vitamin D deficiency, unspecified: Secondary | ICD-10-CM | POA: Diagnosis not present

## 2020-04-08 DIAGNOSIS — S8001XD Contusion of right knee, subsequent encounter: Secondary | ICD-10-CM | POA: Diagnosis not present

## 2020-04-08 DIAGNOSIS — I10 Essential (primary) hypertension: Secondary | ICD-10-CM | POA: Diagnosis not present

## 2020-04-08 DIAGNOSIS — J449 Chronic obstructive pulmonary disease, unspecified: Secondary | ICD-10-CM | POA: Diagnosis not present

## 2020-04-08 DIAGNOSIS — I700 Atherosclerosis of aorta: Secondary | ICD-10-CM | POA: Diagnosis not present

## 2020-04-08 DIAGNOSIS — E538 Deficiency of other specified B group vitamins: Secondary | ICD-10-CM | POA: Diagnosis not present

## 2020-04-08 DIAGNOSIS — I340 Nonrheumatic mitral (valve) insufficiency: Secondary | ICD-10-CM | POA: Diagnosis not present

## 2020-04-08 DIAGNOSIS — J309 Allergic rhinitis, unspecified: Secondary | ICD-10-CM | POA: Diagnosis not present

## 2020-04-08 DIAGNOSIS — H906 Mixed conductive and sensorineural hearing loss, bilateral: Secondary | ICD-10-CM | POA: Diagnosis not present

## 2020-04-08 DIAGNOSIS — K219 Gastro-esophageal reflux disease without esophagitis: Secondary | ICD-10-CM | POA: Diagnosis not present

## 2020-04-08 DIAGNOSIS — G629 Polyneuropathy, unspecified: Secondary | ICD-10-CM | POA: Diagnosis not present

## 2020-04-08 DIAGNOSIS — G4700 Insomnia, unspecified: Secondary | ICD-10-CM | POA: Diagnosis not present

## 2020-04-08 DIAGNOSIS — Z9181 History of falling: Secondary | ICD-10-CM | POA: Diagnosis not present

## 2020-04-08 DIAGNOSIS — E039 Hypothyroidism, unspecified: Secondary | ICD-10-CM | POA: Diagnosis not present

## 2020-04-08 DIAGNOSIS — D509 Iron deficiency anemia, unspecified: Secondary | ICD-10-CM | POA: Diagnosis not present

## 2020-04-08 DIAGNOSIS — T798XXD Other early complications of trauma, subsequent encounter: Secondary | ICD-10-CM | POA: Diagnosis not present

## 2020-04-08 DIAGNOSIS — M50122 Cervical disc disorder at C5-C6 level with radiculopathy: Secondary | ICD-10-CM | POA: Diagnosis not present

## 2020-04-08 DIAGNOSIS — G43909 Migraine, unspecified, not intractable, without status migrainosus: Secondary | ICD-10-CM | POA: Diagnosis not present

## 2020-04-09 DIAGNOSIS — I700 Atherosclerosis of aorta: Secondary | ICD-10-CM | POA: Diagnosis not present

## 2020-04-09 DIAGNOSIS — M50122 Cervical disc disorder at C5-C6 level with radiculopathy: Secondary | ICD-10-CM | POA: Diagnosis not present

## 2020-04-09 DIAGNOSIS — I340 Nonrheumatic mitral (valve) insufficiency: Secondary | ICD-10-CM | POA: Diagnosis not present

## 2020-04-09 DIAGNOSIS — Z8673 Personal history of transient ischemic attack (TIA), and cerebral infarction without residual deficits: Secondary | ICD-10-CM | POA: Diagnosis not present

## 2020-04-09 DIAGNOSIS — S8001XD Contusion of right knee, subsequent encounter: Secondary | ICD-10-CM | POA: Diagnosis not present

## 2020-04-09 DIAGNOSIS — E039 Hypothyroidism, unspecified: Secondary | ICD-10-CM | POA: Diagnosis not present

## 2020-04-09 DIAGNOSIS — K219 Gastro-esophageal reflux disease without esophagitis: Secondary | ICD-10-CM | POA: Diagnosis not present

## 2020-04-09 DIAGNOSIS — Z9181 History of falling: Secondary | ICD-10-CM | POA: Diagnosis not present

## 2020-04-09 DIAGNOSIS — G43909 Migraine, unspecified, not intractable, without status migrainosus: Secondary | ICD-10-CM | POA: Diagnosis not present

## 2020-04-09 DIAGNOSIS — J449 Chronic obstructive pulmonary disease, unspecified: Secondary | ICD-10-CM | POA: Diagnosis not present

## 2020-04-09 DIAGNOSIS — I10 Essential (primary) hypertension: Secondary | ICD-10-CM | POA: Diagnosis not present

## 2020-04-09 DIAGNOSIS — E538 Deficiency of other specified B group vitamins: Secondary | ICD-10-CM | POA: Diagnosis not present

## 2020-04-09 DIAGNOSIS — E559 Vitamin D deficiency, unspecified: Secondary | ICD-10-CM | POA: Diagnosis not present

## 2020-04-09 DIAGNOSIS — J309 Allergic rhinitis, unspecified: Secondary | ICD-10-CM | POA: Diagnosis not present

## 2020-04-09 DIAGNOSIS — H906 Mixed conductive and sensorineural hearing loss, bilateral: Secondary | ICD-10-CM | POA: Diagnosis not present

## 2020-04-09 DIAGNOSIS — G4700 Insomnia, unspecified: Secondary | ICD-10-CM | POA: Diagnosis not present

## 2020-04-09 DIAGNOSIS — G629 Polyneuropathy, unspecified: Secondary | ICD-10-CM | POA: Diagnosis not present

## 2020-04-09 DIAGNOSIS — M1990 Unspecified osteoarthritis, unspecified site: Secondary | ICD-10-CM | POA: Diagnosis not present

## 2020-04-09 DIAGNOSIS — T798XXD Other early complications of trauma, subsequent encounter: Secondary | ICD-10-CM | POA: Diagnosis not present

## 2020-04-09 DIAGNOSIS — D509 Iron deficiency anemia, unspecified: Secondary | ICD-10-CM | POA: Diagnosis not present

## 2020-04-14 DIAGNOSIS — G4700 Insomnia, unspecified: Secondary | ICD-10-CM | POA: Diagnosis not present

## 2020-04-14 DIAGNOSIS — S8001XD Contusion of right knee, subsequent encounter: Secondary | ICD-10-CM | POA: Diagnosis not present

## 2020-04-14 DIAGNOSIS — I700 Atherosclerosis of aorta: Secondary | ICD-10-CM | POA: Diagnosis not present

## 2020-04-14 DIAGNOSIS — D509 Iron deficiency anemia, unspecified: Secondary | ICD-10-CM | POA: Diagnosis not present

## 2020-04-14 DIAGNOSIS — Z9181 History of falling: Secondary | ICD-10-CM | POA: Diagnosis not present

## 2020-04-14 DIAGNOSIS — T798XXD Other early complications of trauma, subsequent encounter: Secondary | ICD-10-CM | POA: Diagnosis not present

## 2020-04-14 DIAGNOSIS — I10 Essential (primary) hypertension: Secondary | ICD-10-CM | POA: Diagnosis not present

## 2020-04-14 DIAGNOSIS — G43909 Migraine, unspecified, not intractable, without status migrainosus: Secondary | ICD-10-CM | POA: Diagnosis not present

## 2020-04-14 DIAGNOSIS — E559 Vitamin D deficiency, unspecified: Secondary | ICD-10-CM | POA: Diagnosis not present

## 2020-04-14 DIAGNOSIS — K219 Gastro-esophageal reflux disease without esophagitis: Secondary | ICD-10-CM | POA: Diagnosis not present

## 2020-04-14 DIAGNOSIS — J309 Allergic rhinitis, unspecified: Secondary | ICD-10-CM | POA: Diagnosis not present

## 2020-04-14 DIAGNOSIS — M50122 Cervical disc disorder at C5-C6 level with radiculopathy: Secondary | ICD-10-CM | POA: Diagnosis not present

## 2020-04-14 DIAGNOSIS — M1990 Unspecified osteoarthritis, unspecified site: Secondary | ICD-10-CM | POA: Diagnosis not present

## 2020-04-14 DIAGNOSIS — E039 Hypothyroidism, unspecified: Secondary | ICD-10-CM | POA: Diagnosis not present

## 2020-04-14 DIAGNOSIS — G629 Polyneuropathy, unspecified: Secondary | ICD-10-CM | POA: Diagnosis not present

## 2020-04-14 DIAGNOSIS — I340 Nonrheumatic mitral (valve) insufficiency: Secondary | ICD-10-CM | POA: Diagnosis not present

## 2020-04-14 DIAGNOSIS — Z8673 Personal history of transient ischemic attack (TIA), and cerebral infarction without residual deficits: Secondary | ICD-10-CM | POA: Diagnosis not present

## 2020-04-14 DIAGNOSIS — H906 Mixed conductive and sensorineural hearing loss, bilateral: Secondary | ICD-10-CM | POA: Diagnosis not present

## 2020-04-14 DIAGNOSIS — J449 Chronic obstructive pulmonary disease, unspecified: Secondary | ICD-10-CM | POA: Diagnosis not present

## 2020-04-14 DIAGNOSIS — E538 Deficiency of other specified B group vitamins: Secondary | ICD-10-CM | POA: Diagnosis not present

## 2020-04-15 DIAGNOSIS — Z8673 Personal history of transient ischemic attack (TIA), and cerebral infarction without residual deficits: Secondary | ICD-10-CM | POA: Diagnosis not present

## 2020-04-15 DIAGNOSIS — G629 Polyneuropathy, unspecified: Secondary | ICD-10-CM | POA: Diagnosis not present

## 2020-04-15 DIAGNOSIS — E538 Deficiency of other specified B group vitamins: Secondary | ICD-10-CM | POA: Diagnosis not present

## 2020-04-15 DIAGNOSIS — I340 Nonrheumatic mitral (valve) insufficiency: Secondary | ICD-10-CM | POA: Diagnosis not present

## 2020-04-15 DIAGNOSIS — T798XXD Other early complications of trauma, subsequent encounter: Secondary | ICD-10-CM | POA: Diagnosis not present

## 2020-04-15 DIAGNOSIS — S8001XD Contusion of right knee, subsequent encounter: Secondary | ICD-10-CM | POA: Diagnosis not present

## 2020-04-15 DIAGNOSIS — M50122 Cervical disc disorder at C5-C6 level with radiculopathy: Secondary | ICD-10-CM | POA: Diagnosis not present

## 2020-04-15 DIAGNOSIS — G4700 Insomnia, unspecified: Secondary | ICD-10-CM | POA: Diagnosis not present

## 2020-04-15 DIAGNOSIS — G43909 Migraine, unspecified, not intractable, without status migrainosus: Secondary | ICD-10-CM | POA: Diagnosis not present

## 2020-04-15 DIAGNOSIS — H906 Mixed conductive and sensorineural hearing loss, bilateral: Secondary | ICD-10-CM | POA: Diagnosis not present

## 2020-04-15 DIAGNOSIS — I700 Atherosclerosis of aorta: Secondary | ICD-10-CM | POA: Diagnosis not present

## 2020-04-15 DIAGNOSIS — D509 Iron deficiency anemia, unspecified: Secondary | ICD-10-CM | POA: Diagnosis not present

## 2020-04-15 DIAGNOSIS — E559 Vitamin D deficiency, unspecified: Secondary | ICD-10-CM | POA: Diagnosis not present

## 2020-04-15 DIAGNOSIS — J309 Allergic rhinitis, unspecified: Secondary | ICD-10-CM | POA: Diagnosis not present

## 2020-04-15 DIAGNOSIS — Z9181 History of falling: Secondary | ICD-10-CM | POA: Diagnosis not present

## 2020-04-15 DIAGNOSIS — K219 Gastro-esophageal reflux disease without esophagitis: Secondary | ICD-10-CM | POA: Diagnosis not present

## 2020-04-15 DIAGNOSIS — I10 Essential (primary) hypertension: Secondary | ICD-10-CM | POA: Diagnosis not present

## 2020-04-15 DIAGNOSIS — J449 Chronic obstructive pulmonary disease, unspecified: Secondary | ICD-10-CM | POA: Diagnosis not present

## 2020-04-15 DIAGNOSIS — M1990 Unspecified osteoarthritis, unspecified site: Secondary | ICD-10-CM | POA: Diagnosis not present

## 2020-04-15 DIAGNOSIS — E039 Hypothyroidism, unspecified: Secondary | ICD-10-CM | POA: Diagnosis not present

## 2020-04-17 DIAGNOSIS — Z743 Need for continuous supervision: Secondary | ICD-10-CM | POA: Diagnosis not present

## 2020-04-17 DIAGNOSIS — R296 Repeated falls: Secondary | ICD-10-CM | POA: Diagnosis not present

## 2020-04-17 DIAGNOSIS — Z91030 Bee allergy status: Secondary | ICD-10-CM | POA: Diagnosis not present

## 2020-04-17 DIAGNOSIS — Z765 Malingerer [conscious simulation]: Secondary | ICD-10-CM | POA: Diagnosis not present

## 2020-04-17 DIAGNOSIS — K449 Diaphragmatic hernia without obstruction or gangrene: Secondary | ICD-10-CM | POA: Diagnosis not present

## 2020-04-17 DIAGNOSIS — M25571 Pain in right ankle and joints of right foot: Secondary | ICD-10-CM | POA: Diagnosis not present

## 2020-04-17 DIAGNOSIS — M7041 Prepatellar bursitis, right knee: Secondary | ICD-10-CM | POA: Diagnosis not present

## 2020-04-17 DIAGNOSIS — Z885 Allergy status to narcotic agent status: Secondary | ICD-10-CM | POA: Diagnosis not present

## 2020-04-17 DIAGNOSIS — R143 Flatulence: Secondary | ICD-10-CM | POA: Diagnosis not present

## 2020-04-17 DIAGNOSIS — Z880 Allergy status to penicillin: Secondary | ICD-10-CM | POA: Diagnosis not present

## 2020-04-17 DIAGNOSIS — Z882 Allergy status to sulfonamides status: Secondary | ICD-10-CM | POA: Diagnosis not present

## 2020-04-17 DIAGNOSIS — K219 Gastro-esophageal reflux disease without esophagitis: Secondary | ICD-10-CM | POA: Diagnosis not present

## 2020-04-17 DIAGNOSIS — R6889 Other general symptoms and signs: Secondary | ICD-10-CM | POA: Diagnosis not present

## 2020-04-17 DIAGNOSIS — R404 Transient alteration of awareness: Secondary | ICD-10-CM | POA: Diagnosis not present

## 2020-04-17 DIAGNOSIS — R55 Syncope and collapse: Secondary | ICD-10-CM | POA: Diagnosis not present

## 2020-04-17 DIAGNOSIS — E860 Dehydration: Secondary | ICD-10-CM | POA: Diagnosis not present

## 2020-04-17 DIAGNOSIS — G8929 Other chronic pain: Secondary | ICD-10-CM | POA: Diagnosis not present

## 2020-04-17 DIAGNOSIS — Z79899 Other long term (current) drug therapy: Secondary | ICD-10-CM | POA: Diagnosis not present

## 2020-04-17 DIAGNOSIS — M25572 Pain in left ankle and joints of left foot: Secondary | ICD-10-CM | POA: Diagnosis not present

## 2020-04-17 DIAGNOSIS — M1711 Unilateral primary osteoarthritis, right knee: Secondary | ICD-10-CM | POA: Diagnosis not present

## 2020-04-17 DIAGNOSIS — R531 Weakness: Secondary | ICD-10-CM | POA: Diagnosis not present

## 2020-04-17 DIAGNOSIS — Z881 Allergy status to other antibiotic agents status: Secondary | ICD-10-CM | POA: Diagnosis not present

## 2020-04-17 DIAGNOSIS — E039 Hypothyroidism, unspecified: Secondary | ICD-10-CM | POA: Diagnosis not present

## 2020-04-17 DIAGNOSIS — Z886 Allergy status to analgesic agent status: Secondary | ICD-10-CM | POA: Diagnosis not present

## 2020-04-17 DIAGNOSIS — Z888 Allergy status to other drugs, medicaments and biological substances status: Secondary | ICD-10-CM | POA: Diagnosis not present

## 2020-04-17 DIAGNOSIS — I10 Essential (primary) hypertension: Secondary | ICD-10-CM | POA: Diagnosis not present

## 2020-04-19 ENCOUNTER — Other Ambulatory Visit: Payer: Self-pay

## 2020-04-19 ENCOUNTER — Ambulatory Visit (INDEPENDENT_AMBULATORY_CARE_PROVIDER_SITE_OTHER): Payer: Medicare Other | Admitting: Cardiovascular Disease

## 2020-04-19 ENCOUNTER — Encounter: Payer: Self-pay | Admitting: Cardiovascular Disease

## 2020-04-19 ENCOUNTER — Telehealth: Payer: Medicare Other | Admitting: Cardiovascular Disease

## 2020-04-19 VITALS — BP 160/80 | HR 56 | Ht 63.0 in | Wt 145.0 lb

## 2020-04-19 DIAGNOSIS — F609 Personality disorder, unspecified: Secondary | ICD-10-CM | POA: Diagnosis not present

## 2020-04-19 DIAGNOSIS — R296 Repeated falls: Secondary | ICD-10-CM | POA: Diagnosis not present

## 2020-04-19 DIAGNOSIS — I700 Atherosclerosis of aorta: Secondary | ICD-10-CM | POA: Diagnosis not present

## 2020-04-19 DIAGNOSIS — M2011 Hallux valgus (acquired), right foot: Secondary | ICD-10-CM | POA: Diagnosis not present

## 2020-04-19 DIAGNOSIS — I10 Essential (primary) hypertension: Secondary | ICD-10-CM | POA: Diagnosis not present

## 2020-04-19 DIAGNOSIS — M2012 Hallux valgus (acquired), left foot: Secondary | ICD-10-CM | POA: Diagnosis not present

## 2020-04-19 DIAGNOSIS — F4321 Adjustment disorder with depressed mood: Secondary | ICD-10-CM | POA: Diagnosis not present

## 2020-04-19 DIAGNOSIS — T792XXA Traumatic secondary and recurrent hemorrhage and seroma, initial encounter: Secondary | ICD-10-CM | POA: Diagnosis not present

## 2020-04-19 MED ORDER — LOSARTAN POTASSIUM 50 MG PO TABS: 100.0000 mg | ORAL_TABLET | Freq: Every day | ORAL | 1 refills | Status: DC

## 2020-04-19 MED ORDER — HYDRALAZINE HCL 25 MG PO TABS: 25.0000 mg | ORAL_TABLET | Freq: Three times a day (TID) | ORAL | 1 refills | Status: DC | PRN
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
## 2020-04-19 NOTE — Patient Instructions (Addendum)
Medication Instructions:  Please take hydralazine 25 mg up to three times a day as needed for blood pressure >150  If you need a refill on your cardiac medications before your next appointment, please call your pharmacy.    Lab work: No new labs needed   If you have labs (blood work) drawn today and your tests are completely normal, you will receive your results only by: Marland Kitchen MyChart Message (if you have MyChart) OR . A paper copy in the mail If you have any lab test that is abnormal or we need to change your treatment, we will call you to review the results.   Testing/Procedures: No new testing needed   Follow-Up: At [facility], you and your health needs are our priority.  As part of our continuing mission to provide you with exceptional heart care, we have created designated Provider Care Teams.  These Care Teams include your primary Cardiologist (physician) and Advanced Practice Providers (APPs -  Physician Assistants and Nurse Practitioners) who all work together to provide you with the care you need, when you need it.  . You will need a follow up appointment as needed BEFORE SCHEDULING, TALK WITH PROVIDER/Zamia Tyminski  . Providers on your designated Care Team:   . Murray Hodgkins, NP . Christell Faith, PA-C . Marrianne Mood, PA-C  Any Other Special Instructions Will Be Listed Below (If Applicable).  COVID-19 Vaccine Information can be found at: ShippingScam.co.uk For questions related to vaccine distribution or appointments, please email [email or web address] or call [phone].

## 2020-04-21 DIAGNOSIS — R0600 Dyspnea, unspecified: Secondary | ICD-10-CM | POA: Diagnosis not present

## 2020-04-21 DIAGNOSIS — E222 Syndrome of inappropriate secretion of antidiuretic hormone: Secondary | ICD-10-CM | POA: Diagnosis not present

## 2020-04-21 DIAGNOSIS — G4700 Insomnia, unspecified: Secondary | ICD-10-CM | POA: Diagnosis not present

## 2020-04-21 DIAGNOSIS — E871 Hypo-osmolality and hyponatremia: Secondary | ICD-10-CM | POA: Diagnosis not present

## 2020-04-21 DIAGNOSIS — E039 Hypothyroidism, unspecified: Secondary | ICD-10-CM | POA: Diagnosis not present

## 2020-04-22 ENCOUNTER — Telehealth: Payer: Self-pay

## 2020-04-22 NOTE — Telephone Encounter (Signed)
Received a fax from [employer] stating that Mrs. Angela [name] has not picked up her prescription of Losartan 50 mg, noted Nov 2021 and more recently 04/19/2020.  Called her pharmacy at [facility] and they confirmed pt has not been picking up her medications currently has 4 medicines that are ready for pick up as of 04/19/20 and pt has failed to pick them up.   Message will be r/t Dr. Rockey Situ so he is aware pt has been non-compliant with her Losartan and possibly other medications.

## 2020-04-23 DIAGNOSIS — J449 Chronic obstructive pulmonary disease, unspecified: Secondary | ICD-10-CM | POA: Diagnosis not present

## 2020-04-24 DIAGNOSIS — R109 Unspecified abdominal pain: Secondary | ICD-10-CM | POA: Diagnosis not present

## 2020-04-24 DIAGNOSIS — R42 Dizziness and giddiness: Secondary | ICD-10-CM | POA: Diagnosis not present

## 2020-04-24 DIAGNOSIS — Z79899 Other long term (current) drug therapy: Secondary | ICD-10-CM | POA: Diagnosis not present

## 2020-04-24 DIAGNOSIS — Z888 Allergy status to other drugs, medicaments and biological substances status: Secondary | ICD-10-CM | POA: Diagnosis not present

## 2020-04-24 DIAGNOSIS — R0602 Shortness of breath: Secondary | ICD-10-CM | POA: Diagnosis not present

## 2020-04-24 DIAGNOSIS — E039 Hypothyroidism, unspecified: Secondary | ICD-10-CM | POA: Diagnosis not present

## 2020-04-24 DIAGNOSIS — Z882 Allergy status to sulfonamides status: Secondary | ICD-10-CM | POA: Diagnosis not present

## 2020-04-24 DIAGNOSIS — Z91018 Allergy to other foods: Secondary | ICD-10-CM | POA: Diagnosis not present

## 2020-04-24 DIAGNOSIS — Z880 Allergy status to penicillin: Secondary | ICD-10-CM | POA: Diagnosis not present

## 2020-04-24 DIAGNOSIS — Z87891 Personal history of nicotine dependence: Secondary | ICD-10-CM | POA: Diagnosis not present

## 2020-04-24 DIAGNOSIS — Z602 Problems related to living alone: Secondary | ICD-10-CM | POA: Diagnosis not present

## 2020-04-24 DIAGNOSIS — R5383 Other fatigue: Secondary | ICD-10-CM | POA: Diagnosis not present

## 2020-04-24 DIAGNOSIS — K219 Gastro-esophageal reflux disease without esophagitis: Secondary | ICD-10-CM | POA: Diagnosis not present

## 2020-04-24 DIAGNOSIS — I10 Essential (primary) hypertension: Secondary | ICD-10-CM | POA: Diagnosis not present

## 2020-04-24 DIAGNOSIS — Z9189 Other specified personal risk factors, not elsewhere classified: Secondary | ICD-10-CM | POA: Diagnosis not present

## 2020-04-24 DIAGNOSIS — K449 Diaphragmatic hernia without obstruction or gangrene: Secondary | ICD-10-CM | POA: Diagnosis not present

## 2020-04-24 DIAGNOSIS — Z881 Allergy status to other antibiotic agents status: Secondary | ICD-10-CM | POA: Diagnosis not present

## 2020-04-24 DIAGNOSIS — R21 Rash and other nonspecific skin eruption: Secondary | ICD-10-CM | POA: Diagnosis not present

## 2020-04-24 DIAGNOSIS — K5900 Constipation, unspecified: Secondary | ICD-10-CM | POA: Diagnosis not present

## 2020-04-24 DIAGNOSIS — Z885 Allergy status to narcotic agent status: Secondary | ICD-10-CM | POA: Diagnosis not present

## 2020-04-24 DIAGNOSIS — R519 Headache, unspecified: Secondary | ICD-10-CM | POA: Diagnosis not present

## 2020-04-24 DIAGNOSIS — R079 Chest pain, unspecified: Secondary | ICD-10-CM | POA: Diagnosis not present

## 2020-04-24 DIAGNOSIS — Z886 Allergy status to analgesic agent status: Secondary | ICD-10-CM | POA: Diagnosis not present

## 2020-04-24 DIAGNOSIS — R34 Anuria and oliguria: Secondary | ICD-10-CM | POA: Diagnosis not present

## 2020-04-25 DIAGNOSIS — R079 Chest pain, unspecified: Secondary | ICD-10-CM | POA: Diagnosis not present

## 2020-04-25 DIAGNOSIS — K449 Diaphragmatic hernia without obstruction or gangrene: Secondary | ICD-10-CM | POA: Diagnosis not present

## 2020-04-29 DIAGNOSIS — T798XXD Other early complications of trauma, subsequent encounter: Secondary | ICD-10-CM | POA: Diagnosis not present

## 2020-04-29 DIAGNOSIS — D509 Iron deficiency anemia, unspecified: Secondary | ICD-10-CM | POA: Diagnosis not present

## 2020-04-29 DIAGNOSIS — G43909 Migraine, unspecified, not intractable, without status migrainosus: Secondary | ICD-10-CM | POA: Diagnosis not present

## 2020-04-29 DIAGNOSIS — M50122 Cervical disc disorder at C5-C6 level with radiculopathy: Secondary | ICD-10-CM | POA: Diagnosis not present

## 2020-04-29 DIAGNOSIS — G4700 Insomnia, unspecified: Secondary | ICD-10-CM | POA: Diagnosis not present

## 2020-04-29 DIAGNOSIS — G629 Polyneuropathy, unspecified: Secondary | ICD-10-CM | POA: Diagnosis not present

## 2020-04-29 DIAGNOSIS — J309 Allergic rhinitis, unspecified: Secondary | ICD-10-CM | POA: Diagnosis not present

## 2020-04-29 DIAGNOSIS — E559 Vitamin D deficiency, unspecified: Secondary | ICD-10-CM | POA: Diagnosis not present

## 2020-04-29 DIAGNOSIS — J449 Chronic obstructive pulmonary disease, unspecified: Secondary | ICD-10-CM | POA: Diagnosis not present

## 2020-04-29 DIAGNOSIS — S8001XD Contusion of right knee, subsequent encounter: Secondary | ICD-10-CM | POA: Diagnosis not present

## 2020-04-29 DIAGNOSIS — I340 Nonrheumatic mitral (valve) insufficiency: Secondary | ICD-10-CM | POA: Diagnosis not present

## 2020-04-29 DIAGNOSIS — Z9181 History of falling: Secondary | ICD-10-CM | POA: Diagnosis not present

## 2020-04-29 DIAGNOSIS — I10 Essential (primary) hypertension: Secondary | ICD-10-CM | POA: Diagnosis not present

## 2020-04-29 DIAGNOSIS — H906 Mixed conductive and sensorineural hearing loss, bilateral: Secondary | ICD-10-CM | POA: Diagnosis not present

## 2020-04-29 DIAGNOSIS — M1990 Unspecified osteoarthritis, unspecified site: Secondary | ICD-10-CM | POA: Diagnosis not present

## 2020-04-29 DIAGNOSIS — Z8673 Personal history of transient ischemic attack (TIA), and cerebral infarction without residual deficits: Secondary | ICD-10-CM | POA: Diagnosis not present

## 2020-04-29 DIAGNOSIS — E538 Deficiency of other specified B group vitamins: Secondary | ICD-10-CM | POA: Diagnosis not present

## 2020-04-29 DIAGNOSIS — E039 Hypothyroidism, unspecified: Secondary | ICD-10-CM | POA: Diagnosis not present

## 2020-04-29 DIAGNOSIS — I700 Atherosclerosis of aorta: Secondary | ICD-10-CM | POA: Diagnosis not present

## 2020-04-29 DIAGNOSIS — K219 Gastro-esophageal reflux disease without esophagitis: Secondary | ICD-10-CM | POA: Diagnosis not present

## 2020-05-05 DIAGNOSIS — G4700 Insomnia, unspecified: Secondary | ICD-10-CM | POA: Diagnosis not present

## 2020-05-05 DIAGNOSIS — M50122 Cervical disc disorder at C5-C6 level with radiculopathy: Secondary | ICD-10-CM | POA: Diagnosis not present

## 2020-05-05 DIAGNOSIS — T798XXD Other early complications of trauma, subsequent encounter: Secondary | ICD-10-CM | POA: Diagnosis not present

## 2020-05-05 DIAGNOSIS — H906 Mixed conductive and sensorineural hearing loss, bilateral: Secondary | ICD-10-CM | POA: Diagnosis not present

## 2020-05-05 DIAGNOSIS — G629 Polyneuropathy, unspecified: Secondary | ICD-10-CM | POA: Diagnosis not present

## 2020-05-05 DIAGNOSIS — J449 Chronic obstructive pulmonary disease, unspecified: Secondary | ICD-10-CM | POA: Diagnosis not present

## 2020-05-05 DIAGNOSIS — D509 Iron deficiency anemia, unspecified: Secondary | ICD-10-CM | POA: Diagnosis not present

## 2020-05-05 DIAGNOSIS — K219 Gastro-esophageal reflux disease without esophagitis: Secondary | ICD-10-CM | POA: Diagnosis not present

## 2020-05-05 DIAGNOSIS — I10 Essential (primary) hypertension: Secondary | ICD-10-CM | POA: Diagnosis not present

## 2020-05-05 DIAGNOSIS — G43909 Migraine, unspecified, not intractable, without status migrainosus: Secondary | ICD-10-CM | POA: Diagnosis not present

## 2020-05-05 DIAGNOSIS — E559 Vitamin D deficiency, unspecified: Secondary | ICD-10-CM | POA: Diagnosis not present

## 2020-05-05 DIAGNOSIS — I700 Atherosclerosis of aorta: Secondary | ICD-10-CM | POA: Diagnosis not present

## 2020-05-05 DIAGNOSIS — E538 Deficiency of other specified B group vitamins: Secondary | ICD-10-CM | POA: Diagnosis not present

## 2020-05-05 DIAGNOSIS — Z8673 Personal history of transient ischemic attack (TIA), and cerebral infarction without residual deficits: Secondary | ICD-10-CM | POA: Diagnosis not present

## 2020-05-05 DIAGNOSIS — M1990 Unspecified osteoarthritis, unspecified site: Secondary | ICD-10-CM | POA: Diagnosis not present

## 2020-05-05 DIAGNOSIS — I340 Nonrheumatic mitral (valve) insufficiency: Secondary | ICD-10-CM | POA: Diagnosis not present

## 2020-05-05 DIAGNOSIS — S8001XD Contusion of right knee, subsequent encounter: Secondary | ICD-10-CM | POA: Diagnosis not present

## 2020-05-05 DIAGNOSIS — Z9181 History of falling: Secondary | ICD-10-CM | POA: Diagnosis not present

## 2020-05-05 DIAGNOSIS — J309 Allergic rhinitis, unspecified: Secondary | ICD-10-CM | POA: Diagnosis not present

## 2020-05-05 DIAGNOSIS — E039 Hypothyroidism, unspecified: Secondary | ICD-10-CM | POA: Diagnosis not present

## 2020-05-06 ENCOUNTER — Other Ambulatory Visit: Payer: Self-pay

## 2020-05-06 ENCOUNTER — Ambulatory Visit
Admission: EM | Admit: 2020-05-06 | Discharge: 2020-05-06 | Disposition: A | Discharge status: Home / Self Care | Payer: Medicare Other

## 2020-05-06 DIAGNOSIS — I119 Hypertensive heart disease without heart failure: Secondary | ICD-10-CM | POA: Diagnosis not present

## 2020-05-06 DIAGNOSIS — R0600 Dyspnea, unspecified: Secondary | ICD-10-CM | POA: Diagnosis not present

## 2020-05-06 DIAGNOSIS — Z5329 Procedure and treatment not carried out because of patient's decision for other reasons: Secondary | ICD-10-CM | POA: Diagnosis not present

## 2020-05-06 NOTE — ED Triage Notes (Signed)
Advised by provider, Shelby Mattocks PA-C to call [facility] [employer].
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
## 2020-05-06 NOTE — ED Notes (Signed)
Pt verbally aggressive and uncooperative during triage.

## 2020-05-13 ENCOUNTER — Telehealth: Payer: Self-pay

## 2020-05-13 NOTE — Telephone Encounter (Signed)
Patient called our office this morning and left a message wanting prescription refills of Permethrin and Ivermectin. Called patient and spoke with her this morning. Patient advised we talked in January and RX RFs were denied then due to patient no showing appointment and needing to be seen. Patient states "we should move past this and worry about patient care and go ahead and refill her medications". Discussed with patient that she has not been seen in over 1 year and this is office protocol and FDA recommendations for no RFs if a patient has not been seen within the time limit. 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 Patient states she is not coming back to our office.

## 2020-05-14 DIAGNOSIS — B86 Scabies: Secondary | ICD-10-CM | POA: Diagnosis not present

## 2020-05-14 DIAGNOSIS — I809 Phlebitis and thrombophlebitis of unspecified site: Secondary | ICD-10-CM | POA: Diagnosis not present

## 2020-05-14 DIAGNOSIS — Z5900 Homelessness unspecified: Secondary | ICD-10-CM | POA: Diagnosis not present

## 2020-05-17 ENCOUNTER — Emergency Department
Admission: EM | Admit: 2020-05-17 | Discharge: 2020-05-17 | Disposition: A | Discharge status: Home / Self Care | Payer: Medicare Other | Attending: Emergency Medicine | Admitting: Emergency Medicine

## 2020-05-17 DIAGNOSIS — Z79899 Other long term (current) drug therapy: Secondary | ICD-10-CM | POA: Insufficient documentation

## 2020-05-17 DIAGNOSIS — Z91010 Allergy to peanuts: Secondary | ICD-10-CM | POA: Insufficient documentation

## 2020-05-17 DIAGNOSIS — I10 Essential (primary) hypertension: Secondary | ICD-10-CM | POA: Insufficient documentation

## 2020-05-17 DIAGNOSIS — Z85828 Personal history of other malignant neoplasm of skin: Secondary | ICD-10-CM | POA: Insufficient documentation

## 2020-05-17 DIAGNOSIS — Z87891 Personal history of nicotine dependence: Secondary | ICD-10-CM | POA: Insufficient documentation

## 2020-05-17 DIAGNOSIS — Z743 Need for continuous supervision: Secondary | ICD-10-CM | POA: Diagnosis not present

## 2020-05-17 DIAGNOSIS — E039 Hypothyroidism, unspecified: Secondary | ICD-10-CM | POA: Diagnosis not present

## 2020-05-17 DIAGNOSIS — J449 Chronic obstructive pulmonary disease, unspecified: Secondary | ICD-10-CM | POA: Insufficient documentation

## 2020-05-17 DIAGNOSIS — R6889 Other general symptoms and signs: Secondary | ICD-10-CM | POA: Diagnosis not present

## 2020-05-17 DIAGNOSIS — T7840XA Allergy, unspecified, initial encounter: Secondary | ICD-10-CM | POA: Diagnosis not present

## 2020-05-17 DIAGNOSIS — T17998A Other foreign object in respiratory tract, part unspecified causing other injury, initial encounter: Secondary | ICD-10-CM | POA: Diagnosis not present

## 2020-05-17 MED ORDER — FAMOTIDINE IN NACL 20-0.9 MG/50ML-% IV SOLN
20.0000 mg | Freq: Once | INTRAVENOUS | Status: AC
  Administered 2020-05-17: 20 mg via INTRAVENOUS
  Filled 2020-05-17: qty 50

## 2020-05-17 MED ORDER — SODIUM CHLORIDE 0.9 % IV BOLUS: 1000.0000 mL | Freq: Once | INTRAVENOUS | Status: DC

## 2020-05-17 MED ORDER — EPINEPHRINE 0.3 MG/0.3ML IJ SOAJ: 0.3000 mg | Freq: Once | INTRAMUSCULAR | 0 refills | Status: AC

## 2020-05-17 MED ORDER — EPINEPHRINE 0.3 MG/0.3ML IJ SOAJ
0.3000 mg | Freq: Once | INTRAMUSCULAR | Status: AC
  Administered 2020-05-17: 0.3 mg via INTRAMUSCULAR
  Filled 2020-05-17: qty 0.3

## 2020-05-17 MED ORDER — ONDANSETRON HCL 4 MG/2ML IJ SOLN
4.0000 mg | Freq: Once | INTRAMUSCULAR | Status: AC
  Administered 2020-05-17: 4 mg via INTRAVENOUS
  Filled 2020-05-17: qty 2

## 2020-05-17 MED ORDER — LORAZEPAM 2 MG/ML IJ SOLN
1.0000 mg | Freq: Once | INTRAMUSCULAR | Status: AC
  Administered 2020-05-17: 1 mg via INTRAVENOUS
  Filled 2020-05-17: qty 1

## 2020-05-17 MED ORDER — DEXAMETHASONE SODIUM PHOSPHATE 10 MG/ML IJ SOLN
10.0000 mg | Freq: Once | INTRAMUSCULAR | Status: AC
  Administered 2020-05-17: 10 mg via INTRAVENOUS
  Filled 2020-05-17: qty 1

## 2020-05-17 MED ORDER — LACTATED RINGERS IV BOLUS
1000.0000 mL | Freq: Once | INTRAVENOUS | Status: AC
  Administered 2020-05-17: 1000 mL via INTRAVENOUS

## 2020-05-17 NOTE — ED Notes (Addendum)
Pt refusing to answer any more triage questions, 'no maam I cant tell you a pain score,' writer suggested to used hands to determine number pt continuing to refuse. 'I need a doctor now, I cant breathe and I do not want a chest xray.'

## 2020-05-17 NOTE — ED Notes (Addendum)
Ice pack placed to R hip where epi shot administered. Currently refusing ekg at this time
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
## 2020-05-17 NOTE — ED Notes (Signed)
PAtient requesting two cups of water and underwear.

## 2020-05-17 NOTE — ED Triage Notes (Signed)
Pt from motel with ems, reports started ivermectin fri/sat for scabies, 1hr ago developed lower chin and throat swelling, dry mouth and 'cant get enough air in.' reports took benadryl x2 pta

## 2020-05-17 NOTE — ED Notes (Signed)
PAtient requesting brief. PAtient given brief

## 2020-05-17 NOTE — ED Notes (Signed)
Patient on toilet at this time. PAtient asked RN to "leave her alone" at this time. Patient given new brief, wipes and trash can to place wipes in

## 2020-05-17 NOTE — Discharge Instructions (Signed)
Use EpiPen in case of acute, life-threatening allergic reaction.  Continue your famotidine daily.  You may take Benadryl as needed.  Return to the ER for worsening symptoms, persistent vomiting, difficulty breathing or other concerns.

## 2020-05-17 NOTE — ED Notes (Signed)
Assisted pt to bathroom and placed back on monitor, continuing to refuse ekg. Additional warm blankets provided and increased room temp

## 2020-05-17 NOTE — ED Notes (Signed)
PAtient refusing to be discharged and asked to speak with MD again. Quentin Cornwall made aware and went to room to speak with patient

## 2020-05-17 NOTE — ED Notes (Signed)
Patient on toilet at this time 

## 2020-05-17 NOTE — ED Provider Notes (Signed)
[facility] Emergency Department Provider Note   ____________________________________________   Event Date/Time   First MD Initiated Contact with Patient 05/17/20 0321     (approximate)  I have reviewed the triage vital signs and the nursing notes.   HISTORY  Chief Complaint Allergic Reaction    HPI Angela [name] is a 70 y.o. female brought to the ED via EMS from local motel with a chief complaint of allergic reaction.  Patient reports she started taking ivermectin over the weekend for scabies.  She was still awake eating "a lot of breathing" when she developed a sensation of lower chin and throat swelling, dry mouth and shortness of breath.  Took 2 Benadryl prior to arrival.  She was on the phone with poison control and refusing to speak with EMS.  States poison control did not think ivermectin would cause allergic reaction.  Patient is now thinking she is allergic to debris since she is allergic to penicillin.  Has 1 localized patch of rash to her left shoulder.  Denies chest pain, abdominal pain, nausea, vomiting or dizziness.     Past Medical History:  Diagnosis Date  . Agitation 09/18/2017  . Allergic rhinitis   . Altered mental status, unspecified 12/25/2018  . Anemia   . Blurred vision   . Chest pain of uncertain etiology 3/55/7322  . Confusion state 12/25/2018  . COPD (chronic obstructive pulmonary disease) ([place])   . Depression   . Diverticulosis   . Endometriosis   . Falls   . GERD (gastroesophageal reflux disease)   . Hernia, hiatal   . Hypothyroidism   . IBS (irritable bowel syndrome)   . Labile mood   . Malignant neoplasm of skin   . Malnutrition of mild degree ([place]) 08/08/2018  . Migraine   . Neuropathy   . Noncompliance 12/25/2017  . PTSD (post-traumatic stress disorder)   . SIADH (syndrome of inappropriate ADH production) ([place]) 09/08/2016  . Thyroid disease     Patient Active Problem List   Diagnosis Date Noted  . Mixed  hyperlipidemia 07/02/2019  . Hypothyroidism due to acquired atrophy of thyroid 04/29/2019  . Osteopenia determined by x-ray 04/29/2019  . Acquired claw toe of both feet 10/23/2018  . Dermatitis 10/22/2018  . Mixed stress and urge urinary incontinence 08/08/2018  . Magnesium deficiency 08/08/2018  . Aortic atherosclerosis ([place]) 05/31/2018  . Achilles tendon disorder 04/25/2018  . Insomnia 04/25/2018  . Diastolic dysfunction 02/54/2706  . Mitral regurgitation 03/29/2018  . Personality disorder ([place]) 12/25/2017  . Arthritis of hand, degenerative 12/07/2017  . Bipolar I disorder, most recent episode mixed, severe with psychotic features ([place]) 09/18/2017  . Hallux valgus, acquired 07/05/2017  . Hammer toes of both feet 07/05/2017  . Unstable ankle 07/05/2017  . Chronic bilateral low back pain without sciatica 06/09/2017  . Tinea unguium 05/08/2017  . Conductive hearing loss of right ear with unrestricted hearing of left ear 04/03/2017  . Adjustment disorder with depressed mood 01/18/2017  . Migraine 01/18/2017  . Irritable bowel syndrome with constipation 11/17/2016  . Bilateral carpal tunnel syndrome 11/17/2016  . Plantar fasciitis, bilateral 11/17/2016  . Cervical myofascial pain syndrome 11/17/2016  . Chronic hyponatremia 11/17/2016  . PTSD (post-traumatic stress disorder) 11/17/2016  . Hiatal hernia 11/17/2016  . Vitamin D deficiency 11/17/2016  . Vitamin B12 deficiency 11/17/2016  . Cervical spondylosis with myelopathy 05/24/2016  . MCI (mild cognitive impairment) with memory loss 03/30/2016  . Gait abnormality 03/30/2016  . Chronic bipolar affective  disorder ([place]) 01/24/2016  . Recurrent major depressive disorder, in partial remission ([place]) 01/24/2016  . Anemia, iron deficiency 06/24/2015  . Benign essential HTN 06/24/2015  . History of falling 05/22/2015  . History of TIA (transient ischemic attack) 04/20/2015  . Gastroesophageal reflux disease with hiatal hernia 04/20/2015   . Cervical disc disorder at C5-C6 level with radiculopathy 03/25/2015  . Chronic neck pain 03/25/2015  . Pelvic pain in female 10/30/2014  . History of skin cancer 06/30/2014  . Chronic headaches 12/16/2013  . Neuropathy 12/16/2013    Past Surgical History:  Procedure Laterality Date  . APPENDECTOMY    . CHOLECYSTECTOMY    . CHOLECYSTECTOMY, LAPAROSCOPIC    . ESOPHAGOGASTRODUODENOSCOPY (EGD) WITH PROPOFOL N/A 03/29/2015   Procedure: ESOPHAGOGASTRODUODENOSCOPY (EGD) WITH PROPOFOL;  Surgeon: Josefine Class, MD;  Location: [facility] ENDOSCOPY;  Service: Endoscopy;  Laterality: N/A;  . HERNIA REPAIR    . laproscopy    . TONSILLECTOMY    . uteral suspension      Prior to Admission medications   Medication Sig Start Date End Date Taking? Authorizing Provider  EPINEPHrine 0.3 mg/0.3 mL IJ SOAJ injection Inject 0.3 mg into the muscle once for 1 dose. 05/17/20 05/17/20 Yes Paulette Blanch, MD  acetaminophen (TYLENOL) 650 MG CR tablet Take 650 mg by mouth every 8 (eight) hours as needed for pain.    [provider]  Acetylcysteine (NAC) 500 MG CAPS Take 500 mg by mouth 2 (two) times a day.    [provider]  albuterol (VENTOLIN HFA) 108 (90 Base) MCG/ACT inhaler albuterol sulfate HFA 90 mcg/actuation aerosol inhaler  INHALE 2 PUFFS BY MOUTH EVERY 4 HOURS AS NEEDED    [provider]  ascorbic acid (VITAMIN C) 1000 MG tablet Vitamin C 1,000 mg tablet  Take 1 tablet twice a day by oral route.    [provider]  atorvastatin (LIPITOR) 20 MG tablet Take 1 tablet (20 mg total) by mouth at bedtime. 04/29/19   Glean Hess, MD  azelastine (ASTELIN) 0.1 % nasal spray azelastine 137 mcg (0.1 %) nasal spray aerosol  PLACE 1 SPRAY INTO BOTH NOSTRILS 2 (TWO) TIMES DAILY 01/02/20   [provider]  calcium carbonate (OSCAL) 1500 (600 Ca) MG TABS tablet Calcium 600 mg calcium (1,500 mg) tablet  Take 1 tablet 3 times a day by oral route.    [provider]  Calcium Citrate (CITRACAL PO) Take 800 mg by mouth. Takes 2 (400mg ) tablets BID    [provider]  Cholecalciferol 100 MCG (4000 UT) CAPS Take 3 capsules by mouth.    [provider]  clobetasol (TEMOVATE) 0.05 % external solution Apply topically as directed. Qd to aa itchy rash until clear, avoid face, groin, axilla 05/07/19   Brendolyn Patty, MD  Cyanocobalamin (VITAMIN B-12) 1000 MCG SUBL Place 1,000 mcg under the tongue.     [provider]  diclofenac sodium (VOLTAREN) 1 % GEL Apply small amount (2 grams) topically over knees or hands up to four times a day if needed for arthritis 11/26/18   Crecencio Mc, MD  famotidine (PEPCID) 20 MG tablet Take 1 tablet (20 mg total) by mouth 2 (two) times daily as needed for heartburn or indigestion. 11/26/18   Crecencio Mc, MD  ferrous sulfate 325 (65 FE) MG tablet Take 1 tablet (325 mg total) by mouth daily with breakfast. 04/29/19   Glean Hess, MD  fluconazole (DIFLUCAN) 200 MG tablet Take 200 mg  by mouth as needed.  02/04/19   [provider]  gabapentin (NEURONTIN) 100 MG capsule Take 100 mg by mouth 3 (three) times daily. 03/25/20   [provider]  hydrALAZINE (APRESOLINE) 25 MG tablet Take 1 tablet (25 mg total) by mouth 3 (three) times daily as needed (for pressure >150). 04/19/20   Minna Merritts, MD  HYDROcodone-acetaminophen (NORCO/VICODIN) 5-325 MG tablet Take 1 tablet by mouth every 6 (six) hours as needed. 04/19/20   [provider]  hydrOXYzine (ATARAX/VISTARIL) 10 MG tablet TAKE 1 TABLET BY MOUTH EVERY 8 HOURS AS NEEDED 06/04/19   Glean Hess, MD  Icosapent Ethyl (VASCEPA) 1 g CAPS TAKE 2 CAPSULES BY MOUTH TWICE DAILY (TO HELP WITH TRIGLYCERIDES) 11/26/18   Crecencio Mc, MD  ivermectin (STROMECTOL) 3 MG TABS tablet TAKE FIVE TABLETS BY MOUTH ON AN EMPTY STOMACH. REPEAT IN ONE WEEK. [place] BEDDING, TOWELS, WORN CLOTHES IN HOT WATER AFTER EACH DOSE 07/08/19   Brendolyn Patty, MD   ketoconazole (NIZORAL) 2 % cream  02/04/19   [provider]  Lactulose 20 GM/30ML SOLN 30 ml every 4 hours until constipation is relieved 04/29/19   Glean Hess, MD  levalbuterol Penne Lash) 0.63 MG/3ML nebulizer solution Take 3 mLs (0.63 mg total) by nebulization every 4 (four) hours as needed for wheezing or shortness of breath. 04/29/19   Glean Hess, MD  levothyroxine (SYNTHROID) 100 MCG tablet Take 100 mcg by mouth daily before breakfast.    [provider]  Lidocaine 4 % PTCH Aspercreme (lidocaine) 4 % topical patch    [provider]  linaclotide (LINZESS) 290 MCG CAPS capsule Take 1 capsule (290 mcg total) by mouth daily before breakfast. 12/26/17   Clapacs, Madie Reno, MD  LORazepam (ATIVAN) 0.5 MG tablet TAKE 1 TABLET BY MOUTH AS NEEDED ONCE DAILY 02/06/18   [provider]  losartan (COZAAR) 50 MG tablet Take 2 tablets (100 mg total) by mouth daily. 04/19/20   Minna Merritts, MD  Magnesium 500 MG TABS Take 500 mg by mouth every other day.    [provider]  mupirocin ointment (BACTROBAN) 2 % Apply 1 application topically 3 (three) times daily. 02/04/19   [provider]  neomycin-bacitracin-polymyxin (NEOSPORIN) ointment Apply topically. 03/11/20   [provider]  ofloxacin (FLOXIN) 0.3 % OTIC solution ofloxacin 0.3 % ear drops    [provider]  ofloxacin (OCUFLOX) 0.3 % ophthalmic solution Apply to eye. 11/13/19   [provider]  ondansetron (ZOFRAN) 4 MG tablet ondansetron HCl 4 mg tablet    [provider]  PARoxetine (PAXIL) 20 MG tablet Take 20 mg by mouth daily.    [provider]  polyethylene glycol-electrolytes (NULYTELY) 420 g solution peg-electrolyte solution 420 gram oral solution    [provider]  prednisoLONE acetate (PRED FORTE) 1 % ophthalmic suspension INSTILL 1 DROP INTO EACH EYE TWICE DAILY 07/26/18   [provider]  PREMARIN vaginal cream  PLACE 1 APPLICATORFUL VAGINALLY TWO TIMES A WEEK. 08/04/19   Rubie Maid, MD  promethazine (PHENERGAN) 12.5 MG tablet Take 12.5 mg by mouth every 6 (six) hours as needed for nausea or vomiting.    [provider]  pyridoxine (B-6) 100 MG tablet Take 1 tablet (100 mg total) by mouth 2 (two) times daily. 12/26/17   Clapacs, Madie Reno, MD  REFRESH 1.4-0.6 % SOLN Apply to eye. 03/17/20   [provider]  Senna-Natural Laxatives (SENOKOT LAXATIVE) Tea Bag MISC Drink  1 cup daily as needed for constipation. Adding 54ml of Lactulose to tea.    [provider]  terconazole (TERAZOL 7) 0.4 % vaginal cream terconazole 0.4 % vaginal cream    [provider]  tiZANidine (ZANAFLEX) 2 MG tablet Take by mouth every 6 (six) hours as needed for muscle spasms.    [provider]  triamcinolone ointment (KENALOG) 0.5 % Apply 1 application topically 2 (two) times daily. 11/26/18   Crecencio Mc, MD  umeclidinium bromide (INCRUSE ELLIPTA) 62.5 MCG/INH AEPB Inhale 1 puff into the lungs daily. 11/26/18   Crecencio Mc, MD  Vitamin D, Ergocalciferol, (DRISDOL) 1.25 MG (50000 UNIT) CAPS capsule Take 1 capsule (50,000 Units total) by mouth every 7 (seven) days. 05/19/19   Glean Hess, MD  XIIDRA 5 % SOLN INSTILL 1 DROP INTO EACH EYE TWICE DAILY 07/27/18   [provider]  traZODone (DESYREL) 50 MG tablet Take 1 tablet (50 mg total) by mouth at bedtime as needed. Pt takes 1/2 tablet qhs 11/26/18 02/25/19  Crecencio Mc, MD    Allergies Amoxicillin-pot clavulanate, Aspirin, Bee venom, Cephalexin, Dicyclomine, Fluticasone-salmeterol, Haloperidol, Molds & smuts, Other, Oxycodone-acetaminophen, Peanut butter flavor, Peanuts [peanut oil], Penicillins, Propoxyphene, Sulfa antibiotics, Sulfacetamide sodium, Tramadol, Amikacin, Bee pollen, Imipramine, Prednisone, Sulfur, Azithromycin, Doxycycline, Elemental sulfur, Fluticasone, Honey bee venom, Hymenoptera venom preparations,  Lactose, Lactose intolerance (gi), Phthalylsulfacetamide, Pregabalin, Risperidone, Sulfacetamide, Adhesive [tape], Albuterol, and Ciprofloxacin  Family History  Problem Relation Age of Onset  . Heart disease Father   . Hearing loss Father   . COPD Father   . Depression Father   . Stroke Father   . Vision loss Father   . Varicose Veins Father   . Cancer Mother        lung  . Depression Sister   . Arthritis Sister   . Arthritis Brother   . Hernia Brother   . Anxiety disorder Brother   . Arthritis Brother   . Hernia Brother   . Anxiety disorder Brother   . Urolithiasis Neg Hx   . Kidney disease Neg Hx   . Kidney cancer Neg Hx   . Prostate cancer Neg Hx   . Breast cancer Neg Hx     Social History Social History   Tobacco Use  . Smoking status: Former Smoker    Quit date: 11/17/1976    Years since quitting: 43.5  . Smokeless tobacco: Never Used  Vaping Use  . Vaping Use: Never used  Substance Use Topics  . Alcohol use: Yes    Comment: occ  . Drug use: No    Review of Systems  Constitutional: No fever/chills Eyes: No visual changes. ENT: Positive for sensation of throat constriction.  No sore throat. Cardiovascular: Denies chest pain. Respiratory: Positive for shortness of breath. Gastrointestinal: No abdominal pain.  No nausea, no vomiting.  No diarrhea.  No constipation. Genitourinary: Negative for dysuria. Musculoskeletal: Negative for back pain. Skin: Negative for rash. Neurological: Negative for headaches, focal weakness or numbness.   ____________________________________________   PHYSICAL EXAM:  VITAL SIGNS: ED Triage Vitals [05/17/20 0323]  Enc Vitals Group     BP (!) 183/95     Pulse Rate 74     Resp (!) 22     Temp 97.9 F (36.6 C)     Temp Source Oral     SpO2 96 %     Weight      Height      Head Circumference  Peak Flow      Pain Score      Pain Loc      Pain Edu?      Excl. in [place]?     Constitutional: Alert and oriented.  Well appearing and in no acute distress. Eyes: Conjunctivae are normal. PERRL. EOMI. Head: Atraumatic. Nose: No congestion/rhinnorhea. Mouth/Throat: Speaks in intentional whisper.  Mucous membranes are moist.  Oropharynx non-erythematous.  There is no tongue or lip angioedema.  Phonation is normal.  There is no hoarse or muffled voice.  There is no drooling.  Tolerating secretions well. Neck: No stridor.  No swelling, mass. Cardiovascular: Normal rate, regular rhythm. Grossly normal heart sounds.  Good peripheral circulation. Respiratory: Normal respiratory effort.  No retractions. Lungs CTAB. Gastrointestinal: Soft and nontender. No distention. No abdominal bruits. No CVA tenderness. Musculoskeletal: No lower extremity tenderness nor edema.  No joint effusions. Neurologic:  Normal speech and language. No gross focal neurologic deficits are appreciated. No gait instability. Skin:  Skin is warm, dry and intact. No urticaria noted.  Small patch of maculopapular rash to left anterior shoulder. Psychiatric: Mood and affect are normal. Speech and behavior are normal.  ____________________________________________   LABS (all labs ordered are listed, but only abnormal results are displayed)  Labs Reviewed - No data to display ____________________________________________  EKG  Refused ____________________________________________  RADIOLOGY I, Darshan Solanki J, personally viewed and evaluated these images (plain radiographs) as part of my medical decision making, as well as reviewing the written report by the radiologist.  ED MD interpretation: None  Official radiology report(s): No results found.  ____________________________________________   PROCEDURES  Procedure(s) performed (including Critical Care):  Procedures   ____________________________________________   INITIAL IMPRESSION / ASSESSMENT AND PLAN / ED COURSE  As part of my medical decision making, I reviewed the following  data within the [facility] [place] [facility] notes reviewed and incorporated, Old chart reviewed and Notes from prior ED visits     70 year old female presenting with possible allergic reaction with sensation of throat closing.  Will administer IV allergic reaction cocktail to include EpiPen, IV Solu-Medrol, IV Pepcid, IV fluids.  Benadryl was taken prior to arrival.  Will monitor in the emergency department.  Clinical Course as of 05/17/20 [id number]  Mon May 17, 2020  0402 Nurses having a bargain with the patient to give her her medications.  Instead of Solu-Medrol, patient wants Dexamethasone.  In order to take EpiPen, patient is demanding either Ativan or Losartan.  Wants lactated Ringer's rather than normal saline. [JS]  [id number] Patient refused EKG. Resting in NAD. No angioedema or swelling noted. Will continue to monitor. [JS]  0610 No angioedema or swelling noted on reassessment.  Speaking in a normal tone now, no longer whispering.  Patient drowsy from Ativan.  We will continue to monitor. [JS]  [phone] Patient will be ready for discharge at 7 AM but will discharge once she is awake and alert.  She was given Decadron in the ED.  Already takes Pepcid as part of her home medications.  Will prescribe EpiPen and encouraged Benadryl to take as needed. [JS]    Clinical Course User Index [JS] Paulette Blanch, MD     ____________________________________________   FINAL CLINICAL IMPRESSION(S) / ED DIAGNOSES  Final diagnoses:  Allergic reaction, initial encounter     ED Discharge Orders         Ordered    EPINEPHrine 0.3 mg/0.3 mL IJ SOAJ injection   Once  05/17/20 0612          *Please note:  Angela [name] was evaluated in Emergency Department on 05/17/2020 for the symptoms described in the history of present illness. She was evaluated in the context of the global COVID-19 pandemic, which necessitated consideration that the patient might be at risk for infection with the  SARS-CoV-2 virus that causes COVID-19. Institutional protocols and algorithms that pertain to the evaluation of patients at risk for COVID-19 are in a state of rapid change based on information released by regulatory bodies including the CDC and federal and state organizations. These policies and algorithms were followed during the patient's care in the ED.  Some ED evaluations and interventions may be delayed as a result of limited staffing during and the pandemic.*   Note:  This document was prepared using Dragon voice recognition software and may include unintentional dictation errors.   Paulette Blanch, MD 05/17/20 [phone]

## 2020-05-23 DIAGNOSIS — J449 Chronic obstructive pulmonary disease, unspecified: Secondary | ICD-10-CM | POA: Diagnosis not present

## 2020-06-04 DIAGNOSIS — M7062 Trochanteric bursitis, left hip: Secondary | ICD-10-CM | POA: Diagnosis not present

## 2020-06-08 DIAGNOSIS — M2010 Hallux valgus (acquired), unspecified foot: Secondary | ICD-10-CM | POA: Diagnosis not present

## 2020-06-08 DIAGNOSIS — M2042 Other hammer toe(s) (acquired), left foot: Secondary | ICD-10-CM | POA: Diagnosis not present

## 2020-06-08 DIAGNOSIS — M2041 Other hammer toe(s) (acquired), right foot: Secondary | ICD-10-CM | POA: Diagnosis not present

## 2020-06-08 DIAGNOSIS — S93409A Sprain of unspecified ligament of unspecified ankle, initial encounter: Secondary | ICD-10-CM | POA: Diagnosis not present

## 2020-06-10 DIAGNOSIS — R062 Wheezing: Secondary | ICD-10-CM | POA: Diagnosis not present

## 2020-06-10 DIAGNOSIS — R0602 Shortness of breath: Secondary | ICD-10-CM | POA: Diagnosis not present

## 2020-06-17 DIAGNOSIS — K449 Diaphragmatic hernia without obstruction or gangrene: Secondary | ICD-10-CM | POA: Diagnosis not present

## 2020-06-17 DIAGNOSIS — R0602 Shortness of breath: Secondary | ICD-10-CM | POA: Diagnosis not present

## 2020-06-17 DIAGNOSIS — R5383 Other fatigue: Secondary | ICD-10-CM | POA: Diagnosis not present

## 2020-06-17 DIAGNOSIS — W19XXXA Unspecified fall, initial encounter: Secondary | ICD-10-CM | POA: Diagnosis not present

## 2020-06-17 DIAGNOSIS — Z743 Need for continuous supervision: Secondary | ICD-10-CM | POA: Diagnosis not present

## 2020-06-17 DIAGNOSIS — R6889 Other general symptoms and signs: Secondary | ICD-10-CM | POA: Diagnosis not present

## 2020-06-17 DIAGNOSIS — Z87891 Personal history of nicotine dependence: Secondary | ICD-10-CM | POA: Diagnosis not present

## 2020-06-17 DIAGNOSIS — R069 Unspecified abnormalities of breathing: Secondary | ICD-10-CM | POA: Diagnosis not present

## 2020-06-17 DIAGNOSIS — R001 Bradycardia, unspecified: Secondary | ICD-10-CM | POA: Diagnosis not present

## 2020-06-17 DIAGNOSIS — J029 Acute pharyngitis, unspecified: Secondary | ICD-10-CM | POA: Diagnosis not present

## 2020-06-17 DIAGNOSIS — R070 Pain in throat: Secondary | ICD-10-CM | POA: Diagnosis not present

## 2020-06-18 ENCOUNTER — Other Ambulatory Visit: Payer: Self-pay

## 2020-06-18 ENCOUNTER — Telehealth: Payer: Self-pay | Admitting: Cardiovascular Disease

## 2020-06-18 DIAGNOSIS — I10 Essential (primary) hypertension: Secondary | ICD-10-CM

## 2020-06-18 NOTE — Telephone Encounter (Signed)
Patient should have medication at pharmacy.   On 04/19/2020 we sent in 180 tablets with 1 refill. Patient should be set until September 2022  losartan (COZAAR) 50 MG tablet 180 tablet 1 04/19/2020    Sig - Route: Take 2 tablets (100 mg total) by mouth daily. - Oral   Sent to pharmacy as: losartan (COZAAR) 50 MG tablet   E-Prescribing Status: Receipt confirmed by pharmacy (04/19/2020  5:33 PM EDT)     Associated Diagnoses  Benign essential HTN      Pharmacy  [place], [place]

## 2020-06-18 NOTE — Telephone Encounter (Signed)
*  STAT* If patient is at the pharmacy, call can be transferred to refill team.   1. Which medications need to be refilled? (please list name of each medication and dose if known) losartan potassium  2. Which pharmacy/location (including street and city if local pharmacy) is medication to be sent to? [facility] in [place]  3. Do they need a 30 day or 90 day supply? 90  Patient reports that the bottle fell and the medication was contaminated and she needs a new script.    Patient also reports a fall where she blacked out and is having trouble functioning. Patient reports that she is going to be leaving the state and would like to discuss her options of care with Dr. Rockey Situ

## 2020-06-18 NOTE — Telephone Encounter (Signed)
Please advise about the fall

## 2020-06-18 NOTE — Telephone Encounter (Signed)
Was able to return call to Mrs. Zucco regarding falls, "blackouts", and feeling of "trouble functionating". Mrs. Fletchers stated "there mis have been some confusion, I am not having trouble functioning, but I have had several more falls since last visit and black out as usual." Reports she is calling in regards to medications, GI will not "take calls", and her "providers thinks my falls are heart related"  Noted Mrs. Hewes has been seen in multiple ED since her last visit in Dr. Rockey Situ on 04/19/2020 with multiple complaints as seen in her EMR.  She would like to speak with Dr. Rockey Situ and "ask a serious of questions, I have concerns and he can only answer". Advised that Dr. Rockey Situ is very full in clinic today, cannot take calls, and is off next week for vacation, but there will be a provider that is covering him.Mrs. Kathrine Cords stated she "just needs a phone call, I can't come in". Advised on a virtual visit, Mrs. Randleman agrees.   Does not want to be seen with any or speak to anyone but Dr. Rockey Situ, virtual visit set for 6/29 at 3:40, went over instructions regarding virtual visit. Pt is thankful.  Did advised Mrs. Hersh on her medications, monitor her BP and HR, and fall prevention and safety. Injuries needs to be address and treated r/t falls and if she develop CP, sob, weakness, and/or dizziness that caused the falls need to be seen for work-up in ED.   Mrs. Akerley verbalized understanding, is thankful for the call "and for understanding everything, Dr. Rockey Situ will know the answer to everything too when I speak to him". Nothing further at this time.

## 2020-06-18 NOTE — Telephone Encounter (Signed)
Hello [place] just checking with you to see if you addressed Losartan refill? I'm not sure if you were working on this at the time.

## 2020-06-23 DIAGNOSIS — J449 Chronic obstructive pulmonary disease, unspecified: Secondary | ICD-10-CM | POA: Diagnosis not present

## 2020-06-25 DIAGNOSIS — M7918 Myalgia, other site: Secondary | ICD-10-CM | POA: Diagnosis not present

## 2020-07-06 ENCOUNTER — Telehealth: Payer: Self-pay

## 2020-07-06 NOTE — Telephone Encounter (Signed)
Patient Consent for Virtual Visit    [name], you are scheduled for a virtual visit with your provider today.  Just as we do with appointments in the office, we must obtain your consent to participate.  Your consent will be active for this visit and any virtual visit you may have with one of our providers in the next 365 days.  If you have a MyChart account, I can also send a copy of this consent to you electronically.  All virtual visits are billed to your insurance company just like a traditional visit in the office.  As this is a virtual visit, video technology does not allow for your provider to perform a traditional examination.  This may limit your provider's ability to fully assess your condition.  If your provider identifies any concerns that need to be evaluated in person or the need to arrange testing such as labs, EKG, etc, we will make arrangements to do so.  Although advances in technology are sophisticated, we cannot ensure that it will always work on either your end or our end.  If the connection with a video visit is poor, we may have to switch to a telephone visit.  With either a video or telephone visit, we are not always able to ensure that we have a secure connection.   I need to obtain your verbal consent now.   Are you willing to proceed with your visit today?    Angela [name] has provided verbal consent on 06/18/2020 for a virtual visit (video or telephone).   CONSENT FOR VIRTUAL VISIT FOR:  Angela [name]  By participating in this virtual visit I agree to the following:  I hereby voluntarily request, consent and authorize [facility] and its employed or contracted physicians, physician assistants, nurse practitioners or other licensed health care professionals (the Practitioner), to provide me with telemedicine health care services (the "Services") as deemed necessary by the treating Practitioner. I acknowledge and consent to receive the Services by the  Practitioner via telemedicine. I understand that the telemedicine visit will involve communicating with the Practitioner through live audiovisual communication technology and the disclosure of certain medical information by electronic transmission. I acknowledge that I have been given the opportunity to request an in-person assessment or other available alternative prior to the telemedicine visit and am voluntarily participating in the telemedicine visit.  I understand that I have the right to withhold or withdraw my consent to the use of telemedicine in the course of my care at any time, without affecting my right to future care or treatment, and that the Practitioner or I may terminate the telemedicine visit at any time. I understand that I have the right to inspect all information obtained and/or recorded in the course of the telemedicine visit and may receive copies of available information for a reasonable fee.  I understand that some of the potential risks of receiving the Services via telemedicine include:  Delay or interruption in medical evaluation due to technological equipment failure or disruption; Information transmitted may not be sufficient (e.g. poor resolution of images) to allow for appropriate medical decision making by the Practitioner; and/or  In rare instances, security protocols could fail, causing a breach of personal health information.  Furthermore, I acknowledge that it is my responsibility to provide information about my medical history, conditions and care that is complete and accurate to the best of my ability. I acknowledge that Practitioner's advice, recommendations, and/or decision may be based on factors  not within their control, such as incomplete or inaccurate data provided by me or distortions of diagnostic images or specimens that may result from electronic transmissions. I understand that the practice of medicine is not an exact science and that Practitioner makes no  warranties or guarantees regarding treatment outcomes. I acknowledge that a copy of this consent can be made available to me via my patient portal ([facility]), or I can request a printed copy by calling the office of [facility].    I understand that my insurance will be billed for this visit.   I have read or had this consent read to me. I understand the contents of this consent, which adequately explains the benefits and risks of the Services being provided via telemedicine.  I have been provided ample opportunity to ask questions regarding this consent and the Services and have had my questions answered to my satisfaction. I give my informed consent for the services to be provided through the use of telemedicine in my medical care

## 2020-07-08 DIAGNOSIS — M4712 Other spondylosis with myelopathy, cervical region: Secondary | ICD-10-CM | POA: Diagnosis not present

## 2020-07-08 DIAGNOSIS — S060X0D Concussion without loss of consciousness, subsequent encounter: Secondary | ICD-10-CM | POA: Diagnosis not present

## 2020-07-08 DIAGNOSIS — W010XXD Fall on same level from slipping, tripping and stumbling without subsequent striking against object, subsequent encounter: Secondary | ICD-10-CM | POA: Diagnosis not present

## 2020-07-08 DIAGNOSIS — M47812 Spondylosis without myelopathy or radiculopathy, cervical region: Secondary | ICD-10-CM | POA: Diagnosis not present

## 2020-07-14 ENCOUNTER — Other Ambulatory Visit: Payer: Self-pay

## 2020-07-14 ENCOUNTER — Encounter: Payer: Self-pay | Admitting: Cardiovascular Disease

## 2020-07-14 ENCOUNTER — Encounter: Payer: Medicare Other | Admitting: Cardiovascular Disease

## 2020-07-16 ENCOUNTER — Telehealth: Payer: Self-pay | Admitting: Cardiovascular Disease

## 2020-07-16 NOTE — Telephone Encounter (Signed)
Called number left for appointment and no answer. Voicemail states "To Call Back" twice but I did leave a message to call back to reschedule her appointment. Called other number listed and it stated no voicemail box has been set up.

## 2020-07-16 NOTE — Telephone Encounter (Signed)
Patient calling  Patient confused as to why she never received a phone call for her virtual visit yesterday Please call to discuss

## 2020-07-20 NOTE — Telephone Encounter (Signed)
Patient is being discharged from our office but waiting for letter to be completed. No follow up needed at this time.

## 2020-07-23 DIAGNOSIS — J449 Chronic obstructive pulmonary disease, unspecified: Secondary | ICD-10-CM | POA: Diagnosis not present

## 2020-07-24 ENCOUNTER — Other Ambulatory Visit: Payer: Self-pay | Admitting: Cardiovascular Disease

## 2020-07-24 DIAGNOSIS — I10 Essential (primary) hypertension: Secondary | ICD-10-CM

## 2020-07-27 ENCOUNTER — Encounter: Payer: Self-pay | Admitting: Cardiovascular Disease

## 2020-07-29 ENCOUNTER — Telehealth: Payer: Self-pay | Admitting: Cardiovascular Disease

## 2020-07-29 NOTE — Telephone Encounter (Signed)
Patient dismissed from [facility] by Ida Rogue, MD, effective 07/27/20. Dismissal Letter sent out by 1st class mail. [employer]

## 2020-08-10 DIAGNOSIS — M546 Pain in thoracic spine: Secondary | ICD-10-CM | POA: Diagnosis not present

## 2020-08-10 DIAGNOSIS — M19011 Primary osteoarthritis, right shoulder: Secondary | ICD-10-CM | POA: Diagnosis not present

## 2020-08-10 DIAGNOSIS — M898X1 Other specified disorders of bone, shoulder: Secondary | ICD-10-CM | POA: Diagnosis not present

## 2020-08-13 DIAGNOSIS — H2513 Age-related nuclear cataract, bilateral: Secondary | ICD-10-CM | POA: Diagnosis not present

## 2020-08-13 DIAGNOSIS — H04123 Dry eye syndrome of bilateral lacrimal glands: Secondary | ICD-10-CM | POA: Diagnosis not present

## 2020-08-13 DIAGNOSIS — M3501 Sicca syndrome with keratoconjunctivitis: Secondary | ICD-10-CM | POA: Diagnosis not present

## 2020-08-16 DIAGNOSIS — I10 Essential (primary) hypertension: Secondary | ICD-10-CM | POA: Diagnosis not present

## 2020-08-16 DIAGNOSIS — Z9181 History of falling: Secondary | ICD-10-CM | POA: Diagnosis not present

## 2020-08-16 DIAGNOSIS — M722 Plantar fascial fibromatosis: Secondary | ICD-10-CM | POA: Diagnosis not present

## 2020-08-16 DIAGNOSIS — M810 Age-related osteoporosis without current pathological fracture: Secondary | ICD-10-CM | POA: Diagnosis not present

## 2020-08-16 DIAGNOSIS — M4712 Other spondylosis with myelopathy, cervical region: Secondary | ICD-10-CM | POA: Diagnosis not present

## 2020-08-16 DIAGNOSIS — M7662 Achilles tendinitis, left leg: Secondary | ICD-10-CM | POA: Diagnosis not present

## 2020-08-17 DIAGNOSIS — M5030 Other cervical disc degeneration, unspecified cervical region: Secondary | ICD-10-CM | POA: Diagnosis not present

## 2020-08-17 DIAGNOSIS — M5011 Cervical disc disorder with radiculopathy,  high cervical region: Secondary | ICD-10-CM | POA: Diagnosis not present

## 2020-08-17 DIAGNOSIS — S060X0D Concussion without loss of consciousness, subsequent encounter: Secondary | ICD-10-CM | POA: Diagnosis not present

## 2020-08-17 DIAGNOSIS — M4313 Spondylolisthesis, cervicothoracic region: Secondary | ICD-10-CM | POA: Diagnosis not present

## 2020-08-17 DIAGNOSIS — M4312 Spondylolisthesis, cervical region: Secondary | ICD-10-CM | POA: Diagnosis not present

## 2020-08-17 DIAGNOSIS — M4722 Other spondylosis with radiculopathy, cervical region: Secondary | ICD-10-CM | POA: Diagnosis not present

## 2020-08-17 DIAGNOSIS — M47812 Spondylosis without myelopathy or radiculopathy, cervical region: Secondary | ICD-10-CM | POA: Diagnosis not present

## 2020-08-17 DIAGNOSIS — I6381 Other cerebral infarction due to occlusion or stenosis of small artery: Secondary | ICD-10-CM | POA: Diagnosis not present

## 2020-08-17 DIAGNOSIS — M4802 Spinal stenosis, cervical region: Secondary | ICD-10-CM | POA: Diagnosis not present

## 2020-08-23 DIAGNOSIS — M4712 Other spondylosis with myelopathy, cervical region: Secondary | ICD-10-CM | POA: Diagnosis not present

## 2020-08-23 DIAGNOSIS — M722 Plantar fascial fibromatosis: Secondary | ICD-10-CM | POA: Diagnosis not present

## 2020-08-23 DIAGNOSIS — J449 Chronic obstructive pulmonary disease, unspecified: Secondary | ICD-10-CM | POA: Diagnosis not present

## 2020-08-23 DIAGNOSIS — M810 Age-related osteoporosis without current pathological fracture: Secondary | ICD-10-CM | POA: Diagnosis not present

## 2020-08-23 DIAGNOSIS — M7662 Achilles tendinitis, left leg: Secondary | ICD-10-CM | POA: Diagnosis not present

## 2020-08-23 DIAGNOSIS — Z9181 History of falling: Secondary | ICD-10-CM | POA: Diagnosis not present

## 2020-08-23 DIAGNOSIS — Z9049 Acquired absence of other specified parts of digestive tract: Secondary | ICD-10-CM | POA: Diagnosis not present

## 2020-08-23 DIAGNOSIS — R0789 Other chest pain: Secondary | ICD-10-CM | POA: Diagnosis not present

## 2020-08-23 DIAGNOSIS — R110 Nausea: Secondary | ICD-10-CM | POA: Diagnosis not present

## 2020-08-23 DIAGNOSIS — R6883 Chills (without fever): Secondary | ICD-10-CM | POA: Diagnosis not present

## 2020-08-23 DIAGNOSIS — R197 Diarrhea, unspecified: Secondary | ICD-10-CM | POA: Diagnosis not present

## 2020-08-23 DIAGNOSIS — Z743 Need for continuous supervision: Secondary | ICD-10-CM | POA: Diagnosis not present

## 2020-08-23 DIAGNOSIS — R55 Syncope and collapse: Secondary | ICD-10-CM | POA: Diagnosis not present

## 2020-08-23 DIAGNOSIS — Z87891 Personal history of nicotine dependence: Secondary | ICD-10-CM | POA: Diagnosis not present

## 2020-08-23 DIAGNOSIS — K449 Diaphragmatic hernia without obstruction or gangrene: Secondary | ICD-10-CM | POA: Diagnosis not present

## 2020-08-23 DIAGNOSIS — E034 Atrophy of thyroid (acquired): Secondary | ICD-10-CM | POA: Diagnosis not present

## 2020-08-23 DIAGNOSIS — R001 Bradycardia, unspecified: Secondary | ICD-10-CM | POA: Diagnosis not present

## 2020-08-23 DIAGNOSIS — R6889 Other general symptoms and signs: Secondary | ICD-10-CM | POA: Diagnosis not present

## 2020-08-23 DIAGNOSIS — I10 Essential (primary) hypertension: Secondary | ICD-10-CM | POA: Diagnosis not present

## 2020-08-23 DIAGNOSIS — R1013 Epigastric pain: Secondary | ICD-10-CM | POA: Diagnosis not present

## 2020-08-23 DIAGNOSIS — I499 Cardiac arrhythmia, unspecified: Secondary | ICD-10-CM | POA: Diagnosis not present

## 2020-08-23 DIAGNOSIS — R109 Unspecified abdominal pain: Secondary | ICD-10-CM | POA: Diagnosis not present

## 2020-08-23 DIAGNOSIS — R079 Chest pain, unspecified: Secondary | ICD-10-CM | POA: Diagnosis not present

## 2020-08-25 DIAGNOSIS — M4712 Other spondylosis with myelopathy, cervical region: Secondary | ICD-10-CM | POA: Diagnosis not present

## 2020-08-25 DIAGNOSIS — I10 Essential (primary) hypertension: Secondary | ICD-10-CM | POA: Diagnosis not present

## 2020-08-25 DIAGNOSIS — M7662 Achilles tendinitis, left leg: Secondary | ICD-10-CM | POA: Diagnosis not present

## 2020-08-25 DIAGNOSIS — M722 Plantar fascial fibromatosis: Secondary | ICD-10-CM | POA: Diagnosis not present

## 2020-08-25 DIAGNOSIS — M810 Age-related osteoporosis without current pathological fracture: Secondary | ICD-10-CM | POA: Diagnosis not present

## 2020-08-25 DIAGNOSIS — Z9181 History of falling: Secondary | ICD-10-CM | POA: Diagnosis not present

## 2020-08-31 DIAGNOSIS — M7662 Achilles tendinitis, left leg: Secondary | ICD-10-CM | POA: Diagnosis not present

## 2020-08-31 DIAGNOSIS — M810 Age-related osteoporosis without current pathological fracture: Secondary | ICD-10-CM | POA: Diagnosis not present

## 2020-08-31 DIAGNOSIS — M722 Plantar fascial fibromatosis: Secondary | ICD-10-CM | POA: Diagnosis not present

## 2020-08-31 DIAGNOSIS — I10 Essential (primary) hypertension: Secondary | ICD-10-CM | POA: Diagnosis not present

## 2020-08-31 DIAGNOSIS — Z9181 History of falling: Secondary | ICD-10-CM | POA: Diagnosis not present

## 2020-08-31 DIAGNOSIS — M4712 Other spondylosis with myelopathy, cervical region: Secondary | ICD-10-CM | POA: Diagnosis not present

## 2020-09-01 DIAGNOSIS — M4712 Other spondylosis with myelopathy, cervical region: Secondary | ICD-10-CM | POA: Diagnosis not present

## 2020-09-01 DIAGNOSIS — I10 Essential (primary) hypertension: Secondary | ICD-10-CM | POA: Diagnosis not present

## 2020-09-01 DIAGNOSIS — M7662 Achilles tendinitis, left leg: Secondary | ICD-10-CM | POA: Diagnosis not present

## 2020-09-01 DIAGNOSIS — M810 Age-related osteoporosis without current pathological fracture: Secondary | ICD-10-CM | POA: Diagnosis not present

## 2020-09-01 DIAGNOSIS — Z9181 History of falling: Secondary | ICD-10-CM | POA: Diagnosis not present

## 2020-09-01 DIAGNOSIS — M722 Plantar fascial fibromatosis: Secondary | ICD-10-CM | POA: Diagnosis not present

## 2020-09-06 DIAGNOSIS — M4712 Other spondylosis with myelopathy, cervical region: Secondary | ICD-10-CM | POA: Diagnosis not present

## 2020-09-06 DIAGNOSIS — Z9181 History of falling: Secondary | ICD-10-CM | POA: Diagnosis not present

## 2020-09-06 DIAGNOSIS — M722 Plantar fascial fibromatosis: Secondary | ICD-10-CM | POA: Diagnosis not present

## 2020-09-06 DIAGNOSIS — M810 Age-related osteoporosis without current pathological fracture: Secondary | ICD-10-CM | POA: Diagnosis not present

## 2020-09-06 DIAGNOSIS — M7662 Achilles tendinitis, left leg: Secondary | ICD-10-CM | POA: Diagnosis not present

## 2020-09-06 DIAGNOSIS — I10 Essential (primary) hypertension: Secondary | ICD-10-CM | POA: Diagnosis not present

## 2020-09-07 DIAGNOSIS — M4712 Other spondylosis with myelopathy, cervical region: Secondary | ICD-10-CM | POA: Diagnosis not present

## 2020-09-07 DIAGNOSIS — M810 Age-related osteoporosis without current pathological fracture: Secondary | ICD-10-CM | POA: Diagnosis not present

## 2020-09-07 DIAGNOSIS — Z9181 History of falling: Secondary | ICD-10-CM | POA: Diagnosis not present

## 2020-09-07 DIAGNOSIS — I10 Essential (primary) hypertension: Secondary | ICD-10-CM | POA: Diagnosis not present

## 2020-09-07 DIAGNOSIS — M722 Plantar fascial fibromatosis: Secondary | ICD-10-CM | POA: Diagnosis not present

## 2020-09-07 DIAGNOSIS — M7662 Achilles tendinitis, left leg: Secondary | ICD-10-CM | POA: Diagnosis not present

## 2020-09-08 DIAGNOSIS — M4712 Other spondylosis with myelopathy, cervical region: Secondary | ICD-10-CM | POA: Diagnosis not present

## 2020-09-08 DIAGNOSIS — M7662 Achilles tendinitis, left leg: Secondary | ICD-10-CM | POA: Diagnosis not present

## 2020-09-08 DIAGNOSIS — M810 Age-related osteoporosis without current pathological fracture: Secondary | ICD-10-CM | POA: Diagnosis not present

## 2020-09-08 DIAGNOSIS — Z9181 History of falling: Secondary | ICD-10-CM | POA: Diagnosis not present

## 2020-09-08 DIAGNOSIS — M722 Plantar fascial fibromatosis: Secondary | ICD-10-CM | POA: Diagnosis not present

## 2020-09-08 DIAGNOSIS — I10 Essential (primary) hypertension: Secondary | ICD-10-CM | POA: Diagnosis not present

## 2020-09-14 DIAGNOSIS — M722 Plantar fascial fibromatosis: Secondary | ICD-10-CM | POA: Diagnosis not present

## 2020-09-14 DIAGNOSIS — M7662 Achilles tendinitis, left leg: Secondary | ICD-10-CM | POA: Diagnosis not present

## 2020-09-14 DIAGNOSIS — Z9181 History of falling: Secondary | ICD-10-CM | POA: Diagnosis not present

## 2020-09-14 DIAGNOSIS — M4712 Other spondylosis with myelopathy, cervical region: Secondary | ICD-10-CM | POA: Diagnosis not present

## 2020-09-14 DIAGNOSIS — I10 Essential (primary) hypertension: Secondary | ICD-10-CM | POA: Diagnosis not present

## 2020-09-14 DIAGNOSIS — M810 Age-related osteoporosis without current pathological fracture: Secondary | ICD-10-CM | POA: Diagnosis not present

## 2020-09-23 DIAGNOSIS — J449 Chronic obstructive pulmonary disease, unspecified: Secondary | ICD-10-CM | POA: Diagnosis not present

## 2020-09-28 DIAGNOSIS — H7201 Central perforation of tympanic membrane, right ear: Secondary | ICD-10-CM | POA: Diagnosis not present

## 2020-09-28 DIAGNOSIS — L299 Pruritus, unspecified: Secondary | ICD-10-CM | POA: Diagnosis not present

## 2020-10-11 NOTE — Progress Notes (Signed)
This encounter was created in error - please disregard.

## 2020-10-18 DIAGNOSIS — K625 Hemorrhage of anus and rectum: Secondary | ICD-10-CM | POA: Diagnosis not present

## 2020-10-18 DIAGNOSIS — K5730 Diverticulosis of large intestine without perforation or abscess without bleeding: Secondary | ICD-10-CM | POA: Diagnosis not present

## 2020-10-18 DIAGNOSIS — R197 Diarrhea, unspecified: Secondary | ICD-10-CM | POA: Diagnosis not present

## 2020-10-18 DIAGNOSIS — I4510 Unspecified right bundle-branch block: Secondary | ICD-10-CM | POA: Diagnosis not present

## 2020-10-18 DIAGNOSIS — K449 Diaphragmatic hernia without obstruction or gangrene: Secondary | ICD-10-CM | POA: Diagnosis not present

## 2020-10-18 DIAGNOSIS — Z20822 Contact with and (suspected) exposure to covid-19: Secondary | ICD-10-CM | POA: Diagnosis not present

## 2020-10-18 DIAGNOSIS — R140 Abdominal distension (gaseous): Secondary | ICD-10-CM | POA: Diagnosis not present

## 2020-10-18 DIAGNOSIS — R001 Bradycardia, unspecified: Secondary | ICD-10-CM | POA: Diagnosis not present

## 2020-10-18 DIAGNOSIS — R109 Unspecified abdominal pain: Secondary | ICD-10-CM | POA: Diagnosis not present

## 2020-10-18 DIAGNOSIS — M549 Dorsalgia, unspecified: Secondary | ICD-10-CM | POA: Diagnosis not present

## 2020-10-19 DIAGNOSIS — R109 Unspecified abdominal pain: Secondary | ICD-10-CM | POA: Diagnosis not present

## 2020-10-23 DIAGNOSIS — J449 Chronic obstructive pulmonary disease, unspecified: Secondary | ICD-10-CM | POA: Diagnosis not present

## 2020-10-31 DIAGNOSIS — Z20822 Contact with and (suspected) exposure to covid-19: Secondary | ICD-10-CM | POA: Diagnosis not present

## 2020-10-31 DIAGNOSIS — R197 Diarrhea, unspecified: Secondary | ICD-10-CM | POA: Diagnosis not present

## 2020-11-03 DIAGNOSIS — J069 Acute upper respiratory infection, unspecified: Secondary | ICD-10-CM | POA: Diagnosis not present

## 2020-11-03 DIAGNOSIS — M25542 Pain in joints of left hand: Secondary | ICD-10-CM | POA: Diagnosis not present

## 2020-11-03 DIAGNOSIS — J309 Allergic rhinitis, unspecified: Secondary | ICD-10-CM | POA: Diagnosis not present

## 2020-11-03 DIAGNOSIS — M25541 Pain in joints of right hand: Secondary | ICD-10-CM | POA: Diagnosis not present

## 2020-11-03 DIAGNOSIS — J4541 Moderate persistent asthma with (acute) exacerbation: Secondary | ICD-10-CM | POA: Diagnosis not present

## 2020-11-03 DIAGNOSIS — I10 Essential (primary) hypertension: Secondary | ICD-10-CM | POA: Diagnosis not present

## 2020-11-03 DIAGNOSIS — R5383 Other fatigue: Secondary | ICD-10-CM | POA: Diagnosis not present

## 2020-11-03 DIAGNOSIS — E039 Hypothyroidism, unspecified: Secondary | ICD-10-CM | POA: Diagnosis not present

## 2020-11-03 DIAGNOSIS — Z0000 Encounter for general adult medical examination without abnormal findings: Secondary | ICD-10-CM | POA: Diagnosis not present

## 2020-11-03 DIAGNOSIS — R531 Weakness: Secondary | ICD-10-CM | POA: Diagnosis not present

## 2020-11-03 DIAGNOSIS — K219 Gastro-esophageal reflux disease without esophagitis: Secondary | ICD-10-CM | POA: Diagnosis not present

## 2020-11-03 DIAGNOSIS — E559 Vitamin D deficiency, unspecified: Secondary | ICD-10-CM | POA: Diagnosis not present

## 2020-11-03 DIAGNOSIS — M62830 Muscle spasm of back: Secondary | ICD-10-CM | POA: Diagnosis not present

## 2020-11-03 DIAGNOSIS — K449 Diaphragmatic hernia without obstruction or gangrene: Secondary | ICD-10-CM | POA: Diagnosis not present

## 2020-11-08 DIAGNOSIS — J069 Acute upper respiratory infection, unspecified: Secondary | ICD-10-CM | POA: Diagnosis not present

## 2020-11-08 DIAGNOSIS — R051 Acute cough: Secondary | ICD-10-CM | POA: Diagnosis not present

## 2020-11-13 DIAGNOSIS — R059 Cough, unspecified: Secondary | ICD-10-CM | POA: Diagnosis not present

## 2020-11-13 DIAGNOSIS — R5383 Other fatigue: Secondary | ICD-10-CM | POA: Diagnosis not present

## 2020-11-23 DIAGNOSIS — R2243 Localized swelling, mass and lump, lower limb, bilateral: Secondary | ICD-10-CM | POA: Diagnosis not present

## 2020-11-23 DIAGNOSIS — R55 Syncope and collapse: Secondary | ICD-10-CM | POA: Diagnosis not present

## 2020-11-23 DIAGNOSIS — J449 Chronic obstructive pulmonary disease, unspecified: Secondary | ICD-10-CM | POA: Diagnosis not present

## 2020-11-25 DIAGNOSIS — M2550 Pain in unspecified joint: Secondary | ICD-10-CM | POA: Diagnosis not present

## 2020-11-25 DIAGNOSIS — K219 Gastro-esophageal reflux disease without esophagitis: Secondary | ICD-10-CM | POA: Diagnosis not present

## 2020-11-25 DIAGNOSIS — E039 Hypothyroidism, unspecified: Secondary | ICD-10-CM | POA: Diagnosis not present

## 2020-11-25 DIAGNOSIS — I10 Essential (primary) hypertension: Secondary | ICD-10-CM | POA: Diagnosis not present

## 2020-11-25 DIAGNOSIS — J4541 Moderate persistent asthma with (acute) exacerbation: Secondary | ICD-10-CM | POA: Diagnosis not present

## 2020-11-29 DIAGNOSIS — I10 Essential (primary) hypertension: Secondary | ICD-10-CM | POA: Diagnosis not present

## 2020-11-29 DIAGNOSIS — E039 Hypothyroidism, unspecified: Secondary | ICD-10-CM | POA: Diagnosis not present

## 2020-11-29 DIAGNOSIS — J4541 Moderate persistent asthma with (acute) exacerbation: Secondary | ICD-10-CM | POA: Diagnosis not present

## 2020-12-07 DIAGNOSIS — R600 Localized edema: Secondary | ICD-10-CM | POA: Diagnosis not present

## 2020-12-07 DIAGNOSIS — R5383 Other fatigue: Secondary | ICD-10-CM | POA: Diagnosis not present

## 2020-12-07 DIAGNOSIS — R531 Weakness: Secondary | ICD-10-CM | POA: Diagnosis not present

## 2020-12-21 DIAGNOSIS — I10 Essential (primary) hypertension: Secondary | ICD-10-CM | POA: Diagnosis not present

## 2020-12-21 DIAGNOSIS — E039 Hypothyroidism, unspecified: Secondary | ICD-10-CM | POA: Diagnosis not present

## 2020-12-21 DIAGNOSIS — R053 Chronic cough: Secondary | ICD-10-CM | POA: Diagnosis not present

## 2020-12-21 DIAGNOSIS — Z87891 Personal history of nicotine dependence: Secondary | ICD-10-CM | POA: Diagnosis not present

## 2020-12-21 DIAGNOSIS — K469 Unspecified abdominal hernia without obstruction or gangrene: Secondary | ICD-10-CM | POA: Diagnosis not present

## 2020-12-22 ENCOUNTER — Other Ambulatory Visit: Payer: Self-pay | Admitting: Obstetrics and Gynecology

## 2020-12-23 DIAGNOSIS — J449 Chronic obstructive pulmonary disease, unspecified: Secondary | ICD-10-CM | POA: Diagnosis not present

## 2020-12-29 ENCOUNTER — Telehealth: Payer: Self-pay | Admitting: Obstetrics and Gynecology

## 2020-12-29 NOTE — Telephone Encounter (Signed)
Pt is calling wanting to speak only to Dr. Marcelline Mates and would not elaborate to the reason of the call stated it is personal.

## 2021-01-04 DIAGNOSIS — R0989 Other specified symptoms and signs involving the circulatory and respiratory systems: Secondary | ICD-10-CM | POA: Diagnosis not present

## 2021-01-04 DIAGNOSIS — R051 Acute cough: Secondary | ICD-10-CM | POA: Diagnosis not present

## 2021-01-06 DIAGNOSIS — E039 Hypothyroidism, unspecified: Secondary | ICD-10-CM | POA: Diagnosis not present

## 2021-01-06 DIAGNOSIS — J4541 Moderate persistent asthma with (acute) exacerbation: Secondary | ICD-10-CM | POA: Diagnosis not present

## 2021-01-06 DIAGNOSIS — I10 Essential (primary) hypertension: Secondary | ICD-10-CM | POA: Diagnosis not present

## 2021-01-13 DIAGNOSIS — R051 Acute cough: Secondary | ICD-10-CM | POA: Diagnosis not present

## 2021-01-13 DIAGNOSIS — U071 COVID-19: Secondary | ICD-10-CM | POA: Diagnosis not present

## 2021-01-13 DIAGNOSIS — J069 Acute upper respiratory infection, unspecified: Secondary | ICD-10-CM | POA: Diagnosis not present

## 2021-01-13 DIAGNOSIS — I10 Essential (primary) hypertension: Secondary | ICD-10-CM | POA: Diagnosis not present

## 2021-01-14 DIAGNOSIS — W19XXXD Unspecified fall, subsequent encounter: Secondary | ICD-10-CM | POA: Diagnosis not present

## 2021-01-14 DIAGNOSIS — M810 Age-related osteoporosis without current pathological fracture: Secondary | ICD-10-CM | POA: Diagnosis not present

## 2021-01-14 DIAGNOSIS — J309 Allergic rhinitis, unspecified: Secondary | ICD-10-CM | POA: Diagnosis not present

## 2021-01-14 DIAGNOSIS — R001 Bradycardia, unspecified: Secondary | ICD-10-CM | POA: Diagnosis not present

## 2021-01-14 DIAGNOSIS — S62606A Fracture of unspecified phalanx of right little finger, initial encounter for closed fracture: Secondary | ICD-10-CM | POA: Diagnosis not present

## 2021-01-14 DIAGNOSIS — K219 Gastro-esophageal reflux disease without esophagitis: Secondary | ICD-10-CM | POA: Diagnosis not present

## 2021-01-14 DIAGNOSIS — S99911A Unspecified injury of right ankle, initial encounter: Secondary | ICD-10-CM | POA: Diagnosis not present

## 2021-01-14 DIAGNOSIS — I517 Cardiomegaly: Secondary | ICD-10-CM | POA: Diagnosis not present

## 2021-01-14 DIAGNOSIS — I10 Essential (primary) hypertension: Secondary | ICD-10-CM | POA: Diagnosis not present

## 2021-01-14 DIAGNOSIS — R059 Cough, unspecified: Secondary | ICD-10-CM | POA: Diagnosis not present

## 2021-01-14 DIAGNOSIS — U071 COVID-19: Secondary | ICD-10-CM | POA: Diagnosis not present

## 2021-01-14 DIAGNOSIS — R051 Acute cough: Secondary | ICD-10-CM | POA: Diagnosis not present

## 2021-01-14 DIAGNOSIS — Z808 Family history of malignant neoplasm of other organs or systems: Secondary | ICD-10-CM | POA: Diagnosis not present

## 2021-01-14 DIAGNOSIS — S62356D Nondisplaced fracture of shaft of fifth metacarpal bone, right hand, subsequent encounter for fracture with routine healing: Secondary | ICD-10-CM | POA: Diagnosis not present

## 2021-01-14 DIAGNOSIS — S62356A Nondisplaced fracture of shaft of fifth metacarpal bone, right hand, initial encounter for closed fracture: Secondary | ICD-10-CM | POA: Diagnosis not present

## 2021-01-14 DIAGNOSIS — W19XXXA Unspecified fall, initial encounter: Secondary | ICD-10-CM | POA: Diagnosis not present

## 2021-01-14 DIAGNOSIS — Z823 Family history of stroke: Secondary | ICD-10-CM | POA: Diagnosis not present

## 2021-01-14 DIAGNOSIS — J310 Chronic rhinitis: Secondary | ICD-10-CM | POA: Diagnosis not present

## 2021-01-14 DIAGNOSIS — S62306A Unspecified fracture of fifth metacarpal bone, right hand, initial encounter for closed fracture: Secondary | ICD-10-CM | POA: Diagnosis not present

## 2021-01-14 DIAGNOSIS — R5381 Other malaise: Secondary | ICD-10-CM | POA: Diagnosis not present

## 2021-01-14 DIAGNOSIS — E860 Dehydration: Secondary | ICD-10-CM | POA: Diagnosis not present

## 2021-01-14 DIAGNOSIS — J449 Chronic obstructive pulmonary disease, unspecified: Secondary | ICD-10-CM | POA: Diagnosis not present

## 2021-01-14 DIAGNOSIS — K449 Diaphragmatic hernia without obstruction or gangrene: Secondary | ICD-10-CM | POA: Diagnosis not present

## 2021-01-14 DIAGNOSIS — Z5982 Transportation insecurity: Secondary | ICD-10-CM | POA: Diagnosis not present

## 2021-01-14 DIAGNOSIS — S6291XD Unspecified fracture of right wrist and hand, subsequent encounter for fracture with routine healing: Secondary | ICD-10-CM | POA: Diagnosis not present

## 2021-01-14 DIAGNOSIS — J1282 Pneumonia due to coronavirus disease 2019: Secondary | ICD-10-CM | POA: Diagnosis not present

## 2021-01-14 DIAGNOSIS — K5730 Diverticulosis of large intestine without perforation or abscess without bleeding: Secondary | ICD-10-CM | POA: Diagnosis not present

## 2021-01-14 DIAGNOSIS — Z5902 Unsheltered homelessness: Secondary | ICD-10-CM | POA: Diagnosis not present

## 2021-01-14 DIAGNOSIS — E039 Hypothyroidism, unspecified: Secondary | ICD-10-CM | POA: Diagnosis not present

## 2021-01-14 DIAGNOSIS — R52 Pain, unspecified: Secondary | ICD-10-CM | POA: Diagnosis not present

## 2021-01-14 DIAGNOSIS — F32A Depression, unspecified: Secondary | ICD-10-CM | POA: Diagnosis not present

## 2021-01-14 DIAGNOSIS — Z801 Family history of malignant neoplasm of trachea, bronchus and lung: Secondary | ICD-10-CM | POA: Diagnosis not present

## 2021-01-14 DIAGNOSIS — W07XXXA Fall from chair, initial encounter: Secondary | ICD-10-CM | POA: Diagnosis not present

## 2021-01-14 DIAGNOSIS — Z8249 Family history of ischemic heart disease and other diseases of the circulatory system: Secondary | ICD-10-CM | POA: Diagnosis not present

## 2021-01-14 DIAGNOSIS — Z9181 History of falling: Secondary | ICD-10-CM | POA: Diagnosis not present

## 2021-01-14 DIAGNOSIS — S6291XA Unspecified fracture of right wrist and hand, initial encounter for closed fracture: Secondary | ICD-10-CM | POA: Diagnosis not present

## 2021-01-14 DIAGNOSIS — K580 Irritable bowel syndrome with diarrhea: Secondary | ICD-10-CM | POA: Diagnosis not present

## 2021-01-14 DIAGNOSIS — S6992XA Unspecified injury of left wrist, hand and finger(s), initial encounter: Secondary | ICD-10-CM | POA: Diagnosis not present

## 2021-01-15 DIAGNOSIS — S6291XA Unspecified fracture of right wrist and hand, initial encounter for closed fracture: Secondary | ICD-10-CM | POA: Diagnosis not present

## 2021-01-15 DIAGNOSIS — U071 COVID-19: Secondary | ICD-10-CM | POA: Diagnosis not present

## 2021-01-15 DIAGNOSIS — R051 Acute cough: Secondary | ICD-10-CM | POA: Diagnosis not present

## 2021-01-15 DIAGNOSIS — S6992XA Unspecified injury of left wrist, hand and finger(s), initial encounter: Secondary | ICD-10-CM | POA: Diagnosis not present

## 2021-01-15 DIAGNOSIS — W19XXXA Unspecified fall, initial encounter: Secondary | ICD-10-CM | POA: Diagnosis not present

## 2021-01-16 DIAGNOSIS — R051 Acute cough: Secondary | ICD-10-CM | POA: Diagnosis not present

## 2021-01-16 DIAGNOSIS — S6291XA Unspecified fracture of right wrist and hand, initial encounter for closed fracture: Secondary | ICD-10-CM | POA: Diagnosis not present

## 2021-01-16 DIAGNOSIS — U071 COVID-19: Secondary | ICD-10-CM | POA: Diagnosis not present

## 2021-01-16 DIAGNOSIS — R001 Bradycardia, unspecified: Secondary | ICD-10-CM | POA: Diagnosis not present

## 2021-01-16 DIAGNOSIS — W19XXXA Unspecified fall, initial encounter: Secondary | ICD-10-CM | POA: Diagnosis not present

## 2021-01-17 DIAGNOSIS — U071 COVID-19: Secondary | ICD-10-CM | POA: Diagnosis not present

## 2021-01-17 DIAGNOSIS — W19XXXA Unspecified fall, initial encounter: Secondary | ICD-10-CM | POA: Diagnosis not present

## 2021-01-17 DIAGNOSIS — S6291XA Unspecified fracture of right wrist and hand, initial encounter for closed fracture: Secondary | ICD-10-CM | POA: Diagnosis not present

## 2021-01-17 DIAGNOSIS — S62356D Nondisplaced fracture of shaft of fifth metacarpal bone, right hand, subsequent encounter for fracture with routine healing: Secondary | ICD-10-CM | POA: Diagnosis not present

## 2021-01-17 DIAGNOSIS — R051 Acute cough: Secondary | ICD-10-CM | POA: Diagnosis not present

## 2021-01-17 DIAGNOSIS — W19XXXD Unspecified fall, subsequent encounter: Secondary | ICD-10-CM | POA: Diagnosis not present

## 2021-01-18 DIAGNOSIS — S6291XD Unspecified fracture of right wrist and hand, subsequent encounter for fracture with routine healing: Secondary | ICD-10-CM | POA: Diagnosis not present

## 2021-01-18 DIAGNOSIS — W19XXXA Unspecified fall, initial encounter: Secondary | ICD-10-CM | POA: Diagnosis not present

## 2021-01-18 DIAGNOSIS — U071 COVID-19: Secondary | ICD-10-CM | POA: Diagnosis not present

## 2021-01-18 DIAGNOSIS — R051 Acute cough: Secondary | ICD-10-CM | POA: Diagnosis not present

## 2021-01-19 DIAGNOSIS — R051 Acute cough: Secondary | ICD-10-CM | POA: Diagnosis not present

## 2021-01-19 DIAGNOSIS — W19XXXA Unspecified fall, initial encounter: Secondary | ICD-10-CM | POA: Diagnosis not present

## 2021-01-19 DIAGNOSIS — U071 COVID-19: Secondary | ICD-10-CM | POA: Diagnosis not present

## 2021-01-19 DIAGNOSIS — F32A Depression, unspecified: Secondary | ICD-10-CM | POA: Diagnosis not present

## 2021-01-20 DIAGNOSIS — W19XXXA Unspecified fall, initial encounter: Secondary | ICD-10-CM | POA: Diagnosis not present

## 2021-01-20 DIAGNOSIS — S6291XD Unspecified fracture of right wrist and hand, subsequent encounter for fracture with routine healing: Secondary | ICD-10-CM | POA: Diagnosis not present

## 2021-01-20 DIAGNOSIS — U071 COVID-19: Secondary | ICD-10-CM | POA: Diagnosis not present

## 2021-01-20 DIAGNOSIS — R051 Acute cough: Secondary | ICD-10-CM | POA: Diagnosis not present

## 2021-01-21 DIAGNOSIS — E039 Hypothyroidism, unspecified: Secondary | ICD-10-CM | POA: Diagnosis not present

## 2021-01-21 DIAGNOSIS — R051 Acute cough: Secondary | ICD-10-CM | POA: Diagnosis not present

## 2021-01-21 DIAGNOSIS — W19XXXA Unspecified fall, initial encounter: Secondary | ICD-10-CM | POA: Diagnosis not present

## 2021-01-21 DIAGNOSIS — I10 Essential (primary) hypertension: Secondary | ICD-10-CM | POA: Diagnosis not present

## 2021-01-21 DIAGNOSIS — K580 Irritable bowel syndrome with diarrhea: Secondary | ICD-10-CM | POA: Diagnosis not present

## 2021-01-21 DIAGNOSIS — K219 Gastro-esophageal reflux disease without esophagitis: Secondary | ICD-10-CM | POA: Diagnosis not present

## 2021-01-21 DIAGNOSIS — S6291XA Unspecified fracture of right wrist and hand, initial encounter for closed fracture: Secondary | ICD-10-CM | POA: Diagnosis not present

## 2021-01-21 DIAGNOSIS — U071 COVID-19: Secondary | ICD-10-CM | POA: Diagnosis not present

## 2021-01-21 DIAGNOSIS — J310 Chronic rhinitis: Secondary | ICD-10-CM | POA: Diagnosis not present

## 2021-01-27 DIAGNOSIS — S62606A Fracture of unspecified phalanx of right little finger, initial encounter for closed fracture: Secondary | ICD-10-CM | POA: Diagnosis not present

## 2021-02-04 DIAGNOSIS — G3184 Mild cognitive impairment, so stated: Secondary | ICD-10-CM | POA: Diagnosis not present

## 2021-02-04 DIAGNOSIS — F5101 Primary insomnia: Secondary | ICD-10-CM | POA: Diagnosis not present

## 2021-02-04 DIAGNOSIS — Z9181 History of falling: Secondary | ICD-10-CM | POA: Diagnosis not present

## 2021-02-04 DIAGNOSIS — M4714 Other spondylosis with myelopathy, thoracic region: Secondary | ICD-10-CM | POA: Diagnosis not present

## 2021-02-06 DIAGNOSIS — I10 Essential (primary) hypertension: Secondary | ICD-10-CM | POA: Diagnosis not present

## 2021-02-06 DIAGNOSIS — E039 Hypothyroidism, unspecified: Secondary | ICD-10-CM | POA: Diagnosis not present

## 2021-02-06 DIAGNOSIS — J4541 Moderate persistent asthma with (acute) exacerbation: Secondary | ICD-10-CM | POA: Diagnosis not present

## 2021-02-07 DIAGNOSIS — Z9181 History of falling: Secondary | ICD-10-CM | POA: Diagnosis not present

## 2021-02-07 DIAGNOSIS — M25571 Pain in right ankle and joints of right foot: Secondary | ICD-10-CM | POA: Diagnosis not present

## 2021-02-07 DIAGNOSIS — E871 Hypo-osmolality and hyponatremia: Secondary | ICD-10-CM | POA: Diagnosis not present

## 2021-02-07 DIAGNOSIS — E039 Hypothyroidism, unspecified: Secondary | ICD-10-CM | POA: Diagnosis not present

## 2021-02-07 DIAGNOSIS — M25561 Pain in right knee: Secondary | ICD-10-CM | POA: Diagnosis not present

## 2021-02-07 DIAGNOSIS — D649 Anemia, unspecified: Secondary | ICD-10-CM | POA: Diagnosis not present

## 2021-02-07 DIAGNOSIS — J1282 Pneumonia due to coronavirus disease 2019: Secondary | ICD-10-CM | POA: Diagnosis not present

## 2021-02-07 DIAGNOSIS — S6291XA Unspecified fracture of right wrist and hand, initial encounter for closed fracture: Secondary | ICD-10-CM | POA: Diagnosis not present

## 2021-02-07 DIAGNOSIS — K219 Gastro-esophageal reflux disease without esophagitis: Secondary | ICD-10-CM | POA: Diagnosis not present

## 2021-02-07 DIAGNOSIS — U071 COVID-19: Secondary | ICD-10-CM | POA: Diagnosis not present

## 2021-02-07 DIAGNOSIS — K5790 Diverticulosis of intestine, part unspecified, without perforation or abscess without bleeding: Secondary | ICD-10-CM | POA: Diagnosis not present

## 2021-02-07 DIAGNOSIS — I10 Essential (primary) hypertension: Secondary | ICD-10-CM | POA: Diagnosis not present

## 2021-02-07 DIAGNOSIS — M8580 Other specified disorders of bone density and structure, unspecified site: Secondary | ICD-10-CM | POA: Diagnosis not present

## 2021-02-07 DIAGNOSIS — G43909 Migraine, unspecified, not intractable, without status migrainosus: Secondary | ICD-10-CM | POA: Diagnosis not present

## 2021-02-07 DIAGNOSIS — Z87891 Personal history of nicotine dependence: Secondary | ICD-10-CM | POA: Diagnosis not present

## 2021-02-07 DIAGNOSIS — J440 Chronic obstructive pulmonary disease with acute lower respiratory infection: Secondary | ICD-10-CM | POA: Diagnosis not present

## 2021-02-07 DIAGNOSIS — K449 Diaphragmatic hernia without obstruction or gangrene: Secondary | ICD-10-CM | POA: Diagnosis not present

## 2021-02-07 DIAGNOSIS — S62306D Unspecified fracture of fifth metacarpal bone, right hand, subsequent encounter for fracture with routine healing: Secondary | ICD-10-CM | POA: Diagnosis not present

## 2021-02-07 DIAGNOSIS — Z9151 Personal history of suicidal behavior: Secondary | ICD-10-CM | POA: Diagnosis not present

## 2021-02-07 DIAGNOSIS — K5792 Diverticulitis of intestine, part unspecified, without perforation or abscess without bleeding: Secondary | ICD-10-CM | POA: Diagnosis not present

## 2021-02-07 DIAGNOSIS — W07XXXD Fall from chair, subsequent encounter: Secondary | ICD-10-CM | POA: Diagnosis not present

## 2021-02-08 DIAGNOSIS — E871 Hypo-osmolality and hyponatremia: Secondary | ICD-10-CM | POA: Diagnosis not present

## 2021-02-08 DIAGNOSIS — M25571 Pain in right ankle and joints of right foot: Secondary | ICD-10-CM | POA: Diagnosis not present

## 2021-02-08 DIAGNOSIS — Z87891 Personal history of nicotine dependence: Secondary | ICD-10-CM | POA: Diagnosis not present

## 2021-02-08 DIAGNOSIS — J440 Chronic obstructive pulmonary disease with acute lower respiratory infection: Secondary | ICD-10-CM | POA: Diagnosis not present

## 2021-02-08 DIAGNOSIS — D649 Anemia, unspecified: Secondary | ICD-10-CM | POA: Diagnosis not present

## 2021-02-08 DIAGNOSIS — G43909 Migraine, unspecified, not intractable, without status migrainosus: Secondary | ICD-10-CM | POA: Diagnosis not present

## 2021-02-08 DIAGNOSIS — R296 Repeated falls: Secondary | ICD-10-CM | POA: Diagnosis not present

## 2021-02-08 DIAGNOSIS — E039 Hypothyroidism, unspecified: Secondary | ICD-10-CM | POA: Diagnosis not present

## 2021-02-08 DIAGNOSIS — U071 COVID-19: Secondary | ICD-10-CM | POA: Diagnosis not present

## 2021-02-08 DIAGNOSIS — K449 Diaphragmatic hernia without obstruction or gangrene: Secondary | ICD-10-CM | POA: Diagnosis not present

## 2021-02-08 DIAGNOSIS — S6291XA Unspecified fracture of right wrist and hand, initial encounter for closed fracture: Secondary | ICD-10-CM | POA: Diagnosis not present

## 2021-02-08 DIAGNOSIS — K5790 Diverticulosis of intestine, part unspecified, without perforation or abscess without bleeding: Secondary | ICD-10-CM | POA: Diagnosis not present

## 2021-02-08 DIAGNOSIS — S62306D Unspecified fracture of fifth metacarpal bone, right hand, subsequent encounter for fracture with routine healing: Secondary | ICD-10-CM | POA: Diagnosis not present

## 2021-02-08 DIAGNOSIS — S62669P Nondisplaced fracture of distal phalanx of unspecified finger, subsequent encounter for fracture with malunion: Secondary | ICD-10-CM | POA: Diagnosis not present

## 2021-02-08 DIAGNOSIS — M8580 Other specified disorders of bone density and structure, unspecified site: Secondary | ICD-10-CM | POA: Diagnosis not present

## 2021-02-08 DIAGNOSIS — Z9181 History of falling: Secondary | ICD-10-CM | POA: Diagnosis not present

## 2021-02-08 DIAGNOSIS — R600 Localized edema: Secondary | ICD-10-CM | POA: Diagnosis not present

## 2021-02-08 DIAGNOSIS — M25561 Pain in right knee: Secondary | ICD-10-CM | POA: Diagnosis not present

## 2021-02-08 DIAGNOSIS — K5792 Diverticulitis of intestine, part unspecified, without perforation or abscess without bleeding: Secondary | ICD-10-CM | POA: Diagnosis not present

## 2021-02-08 DIAGNOSIS — W07XXXD Fall from chair, subsequent encounter: Secondary | ICD-10-CM | POA: Diagnosis not present

## 2021-02-08 DIAGNOSIS — K219 Gastro-esophageal reflux disease without esophagitis: Secondary | ICD-10-CM | POA: Diagnosis not present

## 2021-02-08 DIAGNOSIS — J1282 Pneumonia due to coronavirus disease 2019: Secondary | ICD-10-CM | POA: Diagnosis not present

## 2021-02-08 DIAGNOSIS — I10 Essential (primary) hypertension: Secondary | ICD-10-CM | POA: Diagnosis not present

## 2021-02-08 DIAGNOSIS — Z9151 Personal history of suicidal behavior: Secondary | ICD-10-CM | POA: Diagnosis not present

## 2021-02-11 DIAGNOSIS — S6291XA Unspecified fracture of right wrist and hand, initial encounter for closed fracture: Secondary | ICD-10-CM | POA: Diagnosis not present

## 2021-02-11 DIAGNOSIS — M25571 Pain in right ankle and joints of right foot: Secondary | ICD-10-CM | POA: Diagnosis not present

## 2021-02-11 DIAGNOSIS — I10 Essential (primary) hypertension: Secondary | ICD-10-CM | POA: Diagnosis not present

## 2021-02-11 DIAGNOSIS — E871 Hypo-osmolality and hyponatremia: Secondary | ICD-10-CM | POA: Diagnosis not present

## 2021-02-11 DIAGNOSIS — W07XXXD Fall from chair, subsequent encounter: Secondary | ICD-10-CM | POA: Diagnosis not present

## 2021-02-11 DIAGNOSIS — K5792 Diverticulitis of intestine, part unspecified, without perforation or abscess without bleeding: Secondary | ICD-10-CM | POA: Diagnosis not present

## 2021-02-11 DIAGNOSIS — K5790 Diverticulosis of intestine, part unspecified, without perforation or abscess without bleeding: Secondary | ICD-10-CM | POA: Diagnosis not present

## 2021-02-11 DIAGNOSIS — S62306D Unspecified fracture of fifth metacarpal bone, right hand, subsequent encounter for fracture with routine healing: Secondary | ICD-10-CM | POA: Diagnosis not present

## 2021-02-11 DIAGNOSIS — K449 Diaphragmatic hernia without obstruction or gangrene: Secondary | ICD-10-CM | POA: Diagnosis not present

## 2021-02-11 DIAGNOSIS — J440 Chronic obstructive pulmonary disease with acute lower respiratory infection: Secondary | ICD-10-CM | POA: Diagnosis not present

## 2021-02-11 DIAGNOSIS — K219 Gastro-esophageal reflux disease without esophagitis: Secondary | ICD-10-CM | POA: Diagnosis not present

## 2021-02-11 DIAGNOSIS — U071 COVID-19: Secondary | ICD-10-CM | POA: Diagnosis not present

## 2021-02-11 DIAGNOSIS — Z9181 History of falling: Secondary | ICD-10-CM | POA: Diagnosis not present

## 2021-02-11 DIAGNOSIS — Z87891 Personal history of nicotine dependence: Secondary | ICD-10-CM | POA: Diagnosis not present

## 2021-02-11 DIAGNOSIS — M8580 Other specified disorders of bone density and structure, unspecified site: Secondary | ICD-10-CM | POA: Diagnosis not present

## 2021-02-11 DIAGNOSIS — G43909 Migraine, unspecified, not intractable, without status migrainosus: Secondary | ICD-10-CM | POA: Diagnosis not present

## 2021-02-11 DIAGNOSIS — Z9151 Personal history of suicidal behavior: Secondary | ICD-10-CM | POA: Diagnosis not present

## 2021-02-11 DIAGNOSIS — D649 Anemia, unspecified: Secondary | ICD-10-CM | POA: Diagnosis not present

## 2021-02-11 DIAGNOSIS — E039 Hypothyroidism, unspecified: Secondary | ICD-10-CM | POA: Diagnosis not present

## 2021-02-11 DIAGNOSIS — J1282 Pneumonia due to coronavirus disease 2019: Secondary | ICD-10-CM | POA: Diagnosis not present

## 2021-02-11 DIAGNOSIS — M25561 Pain in right knee: Secondary | ICD-10-CM | POA: Diagnosis not present

## 2021-02-15 DIAGNOSIS — K5792 Diverticulitis of intestine, part unspecified, without perforation or abscess without bleeding: Secondary | ICD-10-CM | POA: Diagnosis not present

## 2021-02-15 DIAGNOSIS — M8580 Other specified disorders of bone density and structure, unspecified site: Secondary | ICD-10-CM | POA: Diagnosis not present

## 2021-02-15 DIAGNOSIS — I10 Essential (primary) hypertension: Secondary | ICD-10-CM | POA: Diagnosis not present

## 2021-02-15 DIAGNOSIS — Z87891 Personal history of nicotine dependence: Secondary | ICD-10-CM | POA: Diagnosis not present

## 2021-02-15 DIAGNOSIS — E871 Hypo-osmolality and hyponatremia: Secondary | ICD-10-CM | POA: Diagnosis not present

## 2021-02-15 DIAGNOSIS — J1282 Pneumonia due to coronavirus disease 2019: Secondary | ICD-10-CM | POA: Diagnosis not present

## 2021-02-15 DIAGNOSIS — J440 Chronic obstructive pulmonary disease with acute lower respiratory infection: Secondary | ICD-10-CM | POA: Diagnosis not present

## 2021-02-15 DIAGNOSIS — D649 Anemia, unspecified: Secondary | ICD-10-CM | POA: Diagnosis not present

## 2021-02-15 DIAGNOSIS — K219 Gastro-esophageal reflux disease without esophagitis: Secondary | ICD-10-CM | POA: Diagnosis not present

## 2021-02-15 DIAGNOSIS — G43909 Migraine, unspecified, not intractable, without status migrainosus: Secondary | ICD-10-CM | POA: Diagnosis not present

## 2021-02-15 DIAGNOSIS — S62306D Unspecified fracture of fifth metacarpal bone, right hand, subsequent encounter for fracture with routine healing: Secondary | ICD-10-CM | POA: Diagnosis not present

## 2021-02-15 DIAGNOSIS — K449 Diaphragmatic hernia without obstruction or gangrene: Secondary | ICD-10-CM | POA: Diagnosis not present

## 2021-02-15 DIAGNOSIS — M25571 Pain in right ankle and joints of right foot: Secondary | ICD-10-CM | POA: Diagnosis not present

## 2021-02-15 DIAGNOSIS — K5790 Diverticulosis of intestine, part unspecified, without perforation or abscess without bleeding: Secondary | ICD-10-CM | POA: Diagnosis not present

## 2021-02-15 DIAGNOSIS — Z9181 History of falling: Secondary | ICD-10-CM | POA: Diagnosis not present

## 2021-02-15 DIAGNOSIS — S6291XA Unspecified fracture of right wrist and hand, initial encounter for closed fracture: Secondary | ICD-10-CM | POA: Diagnosis not present

## 2021-02-15 DIAGNOSIS — U071 COVID-19: Secondary | ICD-10-CM | POA: Diagnosis not present

## 2021-02-15 DIAGNOSIS — M25561 Pain in right knee: Secondary | ICD-10-CM | POA: Diagnosis not present

## 2021-02-15 DIAGNOSIS — E039 Hypothyroidism, unspecified: Secondary | ICD-10-CM | POA: Diagnosis not present

## 2021-02-15 DIAGNOSIS — Z9151 Personal history of suicidal behavior: Secondary | ICD-10-CM | POA: Diagnosis not present

## 2021-02-15 DIAGNOSIS — W07XXXD Fall from chair, subsequent encounter: Secondary | ICD-10-CM | POA: Diagnosis not present

## 2021-02-16 DIAGNOSIS — Z9181 History of falling: Secondary | ICD-10-CM | POA: Diagnosis not present

## 2021-02-16 DIAGNOSIS — K219 Gastro-esophageal reflux disease without esophagitis: Secondary | ICD-10-CM | POA: Diagnosis not present

## 2021-02-16 DIAGNOSIS — E871 Hypo-osmolality and hyponatremia: Secondary | ICD-10-CM | POA: Diagnosis not present

## 2021-02-16 DIAGNOSIS — K5790 Diverticulosis of intestine, part unspecified, without perforation or abscess without bleeding: Secondary | ICD-10-CM | POA: Diagnosis not present

## 2021-02-16 DIAGNOSIS — D649 Anemia, unspecified: Secondary | ICD-10-CM | POA: Diagnosis not present

## 2021-02-16 DIAGNOSIS — J449 Chronic obstructive pulmonary disease, unspecified: Secondary | ICD-10-CM | POA: Diagnosis not present

## 2021-02-16 DIAGNOSIS — G43909 Migraine, unspecified, not intractable, without status migrainosus: Secondary | ICD-10-CM | POA: Diagnosis not present

## 2021-02-16 DIAGNOSIS — S6291XA Unspecified fracture of right wrist and hand, initial encounter for closed fracture: Secondary | ICD-10-CM | POA: Diagnosis not present

## 2021-02-16 DIAGNOSIS — K449 Diaphragmatic hernia without obstruction or gangrene: Secondary | ICD-10-CM | POA: Diagnosis not present

## 2021-02-16 DIAGNOSIS — U071 COVID-19: Secondary | ICD-10-CM | POA: Diagnosis not present

## 2021-02-16 DIAGNOSIS — J4541 Moderate persistent asthma with (acute) exacerbation: Secondary | ICD-10-CM | POA: Diagnosis not present

## 2021-02-16 DIAGNOSIS — Z87891 Personal history of nicotine dependence: Secondary | ICD-10-CM | POA: Diagnosis not present

## 2021-02-16 DIAGNOSIS — S62306D Unspecified fracture of fifth metacarpal bone, right hand, subsequent encounter for fracture with routine healing: Secondary | ICD-10-CM | POA: Diagnosis not present

## 2021-02-16 DIAGNOSIS — M25561 Pain in right knee: Secondary | ICD-10-CM | POA: Diagnosis not present

## 2021-02-16 DIAGNOSIS — J440 Chronic obstructive pulmonary disease with acute lower respiratory infection: Secondary | ICD-10-CM | POA: Diagnosis not present

## 2021-02-16 DIAGNOSIS — W07XXXD Fall from chair, subsequent encounter: Secondary | ICD-10-CM | POA: Diagnosis not present

## 2021-02-16 DIAGNOSIS — Z9151 Personal history of suicidal behavior: Secondary | ICD-10-CM | POA: Diagnosis not present

## 2021-02-16 DIAGNOSIS — M25571 Pain in right ankle and joints of right foot: Secondary | ICD-10-CM | POA: Diagnosis not present

## 2021-02-16 DIAGNOSIS — K5792 Diverticulitis of intestine, part unspecified, without perforation or abscess without bleeding: Secondary | ICD-10-CM | POA: Diagnosis not present

## 2021-02-16 DIAGNOSIS — E039 Hypothyroidism, unspecified: Secondary | ICD-10-CM | POA: Diagnosis not present

## 2021-02-16 DIAGNOSIS — J1282 Pneumonia due to coronavirus disease 2019: Secondary | ICD-10-CM | POA: Diagnosis not present

## 2021-02-16 DIAGNOSIS — I10 Essential (primary) hypertension: Secondary | ICD-10-CM | POA: Diagnosis not present

## 2021-02-16 DIAGNOSIS — M8580 Other specified disorders of bone density and structure, unspecified site: Secondary | ICD-10-CM | POA: Diagnosis not present

## 2021-02-18 DIAGNOSIS — J440 Chronic obstructive pulmonary disease with acute lower respiratory infection: Secondary | ICD-10-CM | POA: Diagnosis not present

## 2021-02-18 DIAGNOSIS — Z9151 Personal history of suicidal behavior: Secondary | ICD-10-CM | POA: Diagnosis not present

## 2021-02-18 DIAGNOSIS — J1282 Pneumonia due to coronavirus disease 2019: Secondary | ICD-10-CM | POA: Diagnosis not present

## 2021-02-18 DIAGNOSIS — K219 Gastro-esophageal reflux disease without esophagitis: Secondary | ICD-10-CM | POA: Diagnosis not present

## 2021-02-18 DIAGNOSIS — S6291XA Unspecified fracture of right wrist and hand, initial encounter for closed fracture: Secondary | ICD-10-CM | POA: Diagnosis not present

## 2021-02-18 DIAGNOSIS — K449 Diaphragmatic hernia without obstruction or gangrene: Secondary | ICD-10-CM | POA: Diagnosis not present

## 2021-02-18 DIAGNOSIS — G43909 Migraine, unspecified, not intractable, without status migrainosus: Secondary | ICD-10-CM | POA: Diagnosis not present

## 2021-02-18 DIAGNOSIS — M25561 Pain in right knee: Secondary | ICD-10-CM | POA: Diagnosis not present

## 2021-02-18 DIAGNOSIS — S62306D Unspecified fracture of fifth metacarpal bone, right hand, subsequent encounter for fracture with routine healing: Secondary | ICD-10-CM | POA: Diagnosis not present

## 2021-02-18 DIAGNOSIS — E871 Hypo-osmolality and hyponatremia: Secondary | ICD-10-CM | POA: Diagnosis not present

## 2021-02-18 DIAGNOSIS — D649 Anemia, unspecified: Secondary | ICD-10-CM | POA: Diagnosis not present

## 2021-02-18 DIAGNOSIS — M25571 Pain in right ankle and joints of right foot: Secondary | ICD-10-CM | POA: Diagnosis not present

## 2021-02-18 DIAGNOSIS — E039 Hypothyroidism, unspecified: Secondary | ICD-10-CM | POA: Diagnosis not present

## 2021-02-18 DIAGNOSIS — I10 Essential (primary) hypertension: Secondary | ICD-10-CM | POA: Diagnosis not present

## 2021-02-18 DIAGNOSIS — Z9181 History of falling: Secondary | ICD-10-CM | POA: Diagnosis not present

## 2021-02-18 DIAGNOSIS — W07XXXD Fall from chair, subsequent encounter: Secondary | ICD-10-CM | POA: Diagnosis not present

## 2021-02-18 DIAGNOSIS — K5790 Diverticulosis of intestine, part unspecified, without perforation or abscess without bleeding: Secondary | ICD-10-CM | POA: Diagnosis not present

## 2021-02-18 DIAGNOSIS — K5792 Diverticulitis of intestine, part unspecified, without perforation or abscess without bleeding: Secondary | ICD-10-CM | POA: Diagnosis not present

## 2021-02-18 DIAGNOSIS — U071 COVID-19: Secondary | ICD-10-CM | POA: Diagnosis not present

## 2021-02-18 DIAGNOSIS — M8580 Other specified disorders of bone density and structure, unspecified site: Secondary | ICD-10-CM | POA: Diagnosis not present

## 2021-02-18 DIAGNOSIS — Z87891 Personal history of nicotine dependence: Secondary | ICD-10-CM | POA: Diagnosis not present

## 2021-02-21 DIAGNOSIS — M25531 Pain in right wrist: Secondary | ICD-10-CM | POA: Diagnosis not present

## 2021-02-24 DIAGNOSIS — K5792 Diverticulitis of intestine, part unspecified, without perforation or abscess without bleeding: Secondary | ICD-10-CM | POA: Diagnosis not present

## 2021-02-24 DIAGNOSIS — M8580 Other specified disorders of bone density and structure, unspecified site: Secondary | ICD-10-CM | POA: Diagnosis not present

## 2021-02-24 DIAGNOSIS — S6291XA Unspecified fracture of right wrist and hand, initial encounter for closed fracture: Secondary | ICD-10-CM | POA: Diagnosis not present

## 2021-02-24 DIAGNOSIS — M25561 Pain in right knee: Secondary | ICD-10-CM | POA: Diagnosis not present

## 2021-02-24 DIAGNOSIS — Z9181 History of falling: Secondary | ICD-10-CM | POA: Diagnosis not present

## 2021-02-24 DIAGNOSIS — Z9151 Personal history of suicidal behavior: Secondary | ICD-10-CM | POA: Diagnosis not present

## 2021-02-24 DIAGNOSIS — Z87891 Personal history of nicotine dependence: Secondary | ICD-10-CM | POA: Diagnosis not present

## 2021-02-24 DIAGNOSIS — I10 Essential (primary) hypertension: Secondary | ICD-10-CM | POA: Diagnosis not present

## 2021-02-24 DIAGNOSIS — E039 Hypothyroidism, unspecified: Secondary | ICD-10-CM | POA: Diagnosis not present

## 2021-02-24 DIAGNOSIS — K449 Diaphragmatic hernia without obstruction or gangrene: Secondary | ICD-10-CM | POA: Diagnosis not present

## 2021-02-24 DIAGNOSIS — W07XXXD Fall from chair, subsequent encounter: Secondary | ICD-10-CM | POA: Diagnosis not present

## 2021-02-24 DIAGNOSIS — U071 COVID-19: Secondary | ICD-10-CM | POA: Diagnosis not present

## 2021-02-24 DIAGNOSIS — J1282 Pneumonia due to coronavirus disease 2019: Secondary | ICD-10-CM | POA: Diagnosis not present

## 2021-02-24 DIAGNOSIS — G43909 Migraine, unspecified, not intractable, without status migrainosus: Secondary | ICD-10-CM | POA: Diagnosis not present

## 2021-02-24 DIAGNOSIS — E871 Hypo-osmolality and hyponatremia: Secondary | ICD-10-CM | POA: Diagnosis not present

## 2021-02-24 DIAGNOSIS — M25571 Pain in right ankle and joints of right foot: Secondary | ICD-10-CM | POA: Diagnosis not present

## 2021-02-24 DIAGNOSIS — J440 Chronic obstructive pulmonary disease with acute lower respiratory infection: Secondary | ICD-10-CM | POA: Diagnosis not present

## 2021-02-24 DIAGNOSIS — S62306D Unspecified fracture of fifth metacarpal bone, right hand, subsequent encounter for fracture with routine healing: Secondary | ICD-10-CM | POA: Diagnosis not present

## 2021-02-24 DIAGNOSIS — W19XXXA Unspecified fall, initial encounter: Secondary | ICD-10-CM | POA: Diagnosis not present

## 2021-02-24 DIAGNOSIS — K219 Gastro-esophageal reflux disease without esophagitis: Secondary | ICD-10-CM | POA: Diagnosis not present

## 2021-02-24 DIAGNOSIS — D649 Anemia, unspecified: Secondary | ICD-10-CM | POA: Diagnosis not present

## 2021-02-24 DIAGNOSIS — K5790 Diverticulosis of intestine, part unspecified, without perforation or abscess without bleeding: Secondary | ICD-10-CM | POA: Diagnosis not present

## 2021-02-25 DIAGNOSIS — M25641 Stiffness of right hand, not elsewhere classified: Secondary | ICD-10-CM | POA: Diagnosis not present

## 2021-03-01 DIAGNOSIS — I10 Essential (primary) hypertension: Secondary | ICD-10-CM | POA: Diagnosis not present

## 2021-03-01 DIAGNOSIS — R7301 Impaired fasting glucose: Secondary | ICD-10-CM | POA: Diagnosis not present

## 2021-03-01 DIAGNOSIS — R600 Localized edema: Secondary | ICD-10-CM | POA: Diagnosis not present

## 2021-03-01 DIAGNOSIS — E7849 Other hyperlipidemia: Secondary | ICD-10-CM | POA: Diagnosis not present

## 2021-03-01 DIAGNOSIS — Z9181 History of falling: Secondary | ICD-10-CM | POA: Diagnosis not present

## 2021-03-01 DIAGNOSIS — J449 Chronic obstructive pulmonary disease, unspecified: Secondary | ICD-10-CM | POA: Diagnosis not present

## 2021-03-01 DIAGNOSIS — E039 Hypothyroidism, unspecified: Secondary | ICD-10-CM | POA: Diagnosis not present

## 2021-03-04 DIAGNOSIS — M13842 Other specified arthritis, left hand: Secondary | ICD-10-CM | POA: Diagnosis not present

## 2021-03-04 DIAGNOSIS — S62356D Nondisplaced fracture of shaft of fifth metacarpal bone, right hand, subsequent encounter for fracture with routine healing: Secondary | ICD-10-CM | POA: Diagnosis not present

## 2021-03-08 DIAGNOSIS — I10 Essential (primary) hypertension: Secondary | ICD-10-CM | POA: Diagnosis not present

## 2021-03-08 DIAGNOSIS — J4541 Moderate persistent asthma with (acute) exacerbation: Secondary | ICD-10-CM | POA: Diagnosis not present

## 2021-03-08 DIAGNOSIS — R531 Weakness: Secondary | ICD-10-CM | POA: Diagnosis not present

## 2021-03-08 DIAGNOSIS — R296 Repeated falls: Secondary | ICD-10-CM | POA: Diagnosis not present

## 2021-03-16 DIAGNOSIS — I10 Essential (primary) hypertension: Secondary | ICD-10-CM | POA: Diagnosis not present

## 2021-03-16 DIAGNOSIS — R296 Repeated falls: Secondary | ICD-10-CM | POA: Diagnosis not present

## 2021-03-18 DIAGNOSIS — M13842 Other specified arthritis, left hand: Secondary | ICD-10-CM | POA: Diagnosis not present

## 2021-03-18 DIAGNOSIS — M13841 Other specified arthritis, right hand: Secondary | ICD-10-CM | POA: Diagnosis not present

## 2021-03-21 DIAGNOSIS — M67873 Other specified disorders of tendon, right ankle and foot: Secondary | ICD-10-CM | POA: Diagnosis not present

## 2021-03-21 DIAGNOSIS — M13842 Other specified arthritis, left hand: Secondary | ICD-10-CM | POA: Diagnosis not present

## 2021-03-21 DIAGNOSIS — M13841 Other specified arthritis, right hand: Secondary | ICD-10-CM | POA: Diagnosis not present

## 2021-03-21 DIAGNOSIS — M67874 Other specified disorders of tendon, left ankle and foot: Secondary | ICD-10-CM | POA: Diagnosis not present

## 2021-03-28 DIAGNOSIS — M79641 Pain in right hand: Secondary | ICD-10-CM | POA: Diagnosis not present

## 2021-03-28 DIAGNOSIS — M25641 Stiffness of right hand, not elsewhere classified: Secondary | ICD-10-CM | POA: Diagnosis not present

## 2021-03-29 DIAGNOSIS — K130 Diseases of lips: Secondary | ICD-10-CM | POA: Diagnosis not present

## 2021-03-29 DIAGNOSIS — I10 Essential (primary) hypertension: Secondary | ICD-10-CM | POA: Diagnosis not present

## 2021-03-29 DIAGNOSIS — R296 Repeated falls: Secondary | ICD-10-CM | POA: Diagnosis not present

## 2021-03-29 DIAGNOSIS — H1010 Acute atopic conjunctivitis, unspecified eye: Secondary | ICD-10-CM | POA: Diagnosis not present

## 2021-03-30 DIAGNOSIS — M2012 Hallux valgus (acquired), left foot: Secondary | ICD-10-CM | POA: Diagnosis not present

## 2021-03-30 DIAGNOSIS — M19072 Primary osteoarthritis, left ankle and foot: Secondary | ICD-10-CM | POA: Diagnosis not present

## 2021-03-30 DIAGNOSIS — M2141 Flat foot [pes planus] (acquired), right foot: Secondary | ICD-10-CM | POA: Diagnosis not present

## 2021-03-30 DIAGNOSIS — M2042 Other hammer toe(s) (acquired), left foot: Secondary | ICD-10-CM | POA: Diagnosis not present

## 2021-04-01 DIAGNOSIS — Z79899 Other long term (current) drug therapy: Secondary | ICD-10-CM | POA: Diagnosis not present

## 2021-04-01 DIAGNOSIS — R531 Weakness: Secondary | ICD-10-CM | POA: Diagnosis not present

## 2021-04-01 DIAGNOSIS — K449 Diaphragmatic hernia without obstruction or gangrene: Secondary | ICD-10-CM | POA: Diagnosis not present

## 2021-04-01 DIAGNOSIS — Z87891 Personal history of nicotine dependence: Secondary | ICD-10-CM | POA: Diagnosis not present

## 2021-04-01 DIAGNOSIS — R2689 Other abnormalities of gait and mobility: Secondary | ICD-10-CM | POA: Diagnosis not present

## 2021-04-01 DIAGNOSIS — Z880 Allergy status to penicillin: Secondary | ICD-10-CM | POA: Diagnosis not present

## 2021-04-01 DIAGNOSIS — R110 Nausea: Secondary | ICD-10-CM | POA: Diagnosis not present

## 2021-04-01 DIAGNOSIS — S6991XD Unspecified injury of right wrist, hand and finger(s), subsequent encounter: Secondary | ICD-10-CM | POA: Diagnosis not present

## 2021-04-01 DIAGNOSIS — R079 Chest pain, unspecified: Secondary | ICD-10-CM | POA: Diagnosis not present

## 2021-04-01 DIAGNOSIS — K5730 Diverticulosis of large intestine without perforation or abscess without bleeding: Secondary | ICD-10-CM | POA: Diagnosis not present

## 2021-04-01 DIAGNOSIS — Z882 Allergy status to sulfonamides status: Secondary | ICD-10-CM | POA: Diagnosis not present

## 2021-04-01 DIAGNOSIS — Z9181 History of falling: Secondary | ICD-10-CM | POA: Diagnosis not present

## 2021-04-01 DIAGNOSIS — E039 Hypothyroidism, unspecified: Secondary | ICD-10-CM | POA: Diagnosis not present

## 2021-04-01 DIAGNOSIS — Z885 Allergy status to narcotic agent status: Secondary | ICD-10-CM | POA: Diagnosis not present

## 2021-04-01 DIAGNOSIS — R269 Unspecified abnormalities of gait and mobility: Secondary | ICD-10-CM | POA: Diagnosis not present

## 2021-04-01 DIAGNOSIS — R0600 Dyspnea, unspecified: Secondary | ICD-10-CM | POA: Diagnosis not present

## 2021-04-01 DIAGNOSIS — Z881 Allergy status to other antibiotic agents status: Secondary | ICD-10-CM | POA: Diagnosis not present

## 2021-04-01 DIAGNOSIS — I10 Essential (primary) hypertension: Secondary | ICD-10-CM | POA: Diagnosis not present

## 2021-04-01 DIAGNOSIS — R197 Diarrhea, unspecified: Secondary | ICD-10-CM | POA: Diagnosis not present

## 2021-04-01 DIAGNOSIS — Z20822 Contact with and (suspected) exposure to covid-19: Secondary | ICD-10-CM | POA: Diagnosis not present

## 2021-04-01 DIAGNOSIS — R5381 Other malaise: Secondary | ICD-10-CM | POA: Diagnosis not present

## 2021-04-01 DIAGNOSIS — R0789 Other chest pain: Secondary | ICD-10-CM | POA: Diagnosis not present

## 2021-04-01 DIAGNOSIS — Z886 Allergy status to analgesic agent status: Secondary | ICD-10-CM | POA: Diagnosis not present

## 2021-04-02 DIAGNOSIS — R110 Nausea: Secondary | ICD-10-CM | POA: Diagnosis not present

## 2021-04-02 DIAGNOSIS — K449 Diaphragmatic hernia without obstruction or gangrene: Secondary | ICD-10-CM | POA: Diagnosis not present

## 2021-04-02 DIAGNOSIS — R0600 Dyspnea, unspecified: Secondary | ICD-10-CM | POA: Diagnosis not present

## 2021-04-02 DIAGNOSIS — K5730 Diverticulosis of large intestine without perforation or abscess without bleeding: Secondary | ICD-10-CM | POA: Diagnosis not present

## 2021-04-02 DIAGNOSIS — R079 Chest pain, unspecified: Secondary | ICD-10-CM | POA: Diagnosis not present

## 2021-04-03 DIAGNOSIS — R079 Chest pain, unspecified: Secondary | ICD-10-CM | POA: Diagnosis not present

## 2021-04-04 DIAGNOSIS — M25641 Stiffness of right hand, not elsewhere classified: Secondary | ICD-10-CM | POA: Diagnosis not present

## 2021-04-04 DIAGNOSIS — M79641 Pain in right hand: Secondary | ICD-10-CM | POA: Diagnosis not present

## 2021-04-19 DIAGNOSIS — R262 Difficulty in walking, not elsewhere classified: Secondary | ICD-10-CM | POA: Diagnosis not present

## 2021-04-19 DIAGNOSIS — I10 Essential (primary) hypertension: Secondary | ICD-10-CM | POA: Diagnosis not present

## 2021-04-19 DIAGNOSIS — R220 Localized swelling, mass and lump, head: Secondary | ICD-10-CM | POA: Diagnosis not present

## 2021-04-25 ENCOUNTER — Other Ambulatory Visit: Payer: Self-pay | Admitting: Obstetrics and Gynecology

## 2021-05-02 DIAGNOSIS — R627 Adult failure to thrive: Secondary | ICD-10-CM | POA: Diagnosis not present

## 2021-05-02 DIAGNOSIS — K449 Diaphragmatic hernia without obstruction or gangrene: Secondary | ICD-10-CM | POA: Diagnosis not present

## 2021-05-02 DIAGNOSIS — R197 Diarrhea, unspecified: Secondary | ICD-10-CM | POA: Diagnosis not present

## 2021-05-03 DIAGNOSIS — R197 Diarrhea, unspecified: Secondary | ICD-10-CM | POA: Diagnosis not present

## 2021-05-03 DIAGNOSIS — R1032 Left lower quadrant pain: Secondary | ICD-10-CM | POA: Diagnosis not present

## 2021-05-03 DIAGNOSIS — K5790 Diverticulosis of intestine, part unspecified, without perforation or abscess without bleeding: Secondary | ICD-10-CM | POA: Diagnosis not present

## 2021-05-05 DIAGNOSIS — R197 Diarrhea, unspecified: Secondary | ICD-10-CM | POA: Diagnosis not present

## 2021-05-05 DIAGNOSIS — K5790 Diverticulosis of intestine, part unspecified, without perforation or abscess without bleeding: Secondary | ICD-10-CM | POA: Diagnosis not present

## 2021-05-05 DIAGNOSIS — N3289 Other specified disorders of bladder: Secondary | ICD-10-CM | POA: Diagnosis not present

## 2021-05-05 DIAGNOSIS — K449 Diaphragmatic hernia without obstruction or gangrene: Secondary | ICD-10-CM | POA: Diagnosis not present

## 2021-05-05 DIAGNOSIS — K5730 Diverticulosis of large intestine without perforation or abscess without bleeding: Secondary | ICD-10-CM | POA: Diagnosis not present

## 2021-05-05 DIAGNOSIS — K458 Other specified abdominal hernia without obstruction or gangrene: Secondary | ICD-10-CM | POA: Diagnosis not present

## 2021-05-05 DIAGNOSIS — K562 Volvulus: Secondary | ICD-10-CM | POA: Diagnosis not present

## 2021-05-06 DIAGNOSIS — R197 Diarrhea, unspecified: Secondary | ICD-10-CM | POA: Diagnosis not present

## 2021-05-09 DIAGNOSIS — I69322 Dysarthria following cerebral infarction: Secondary | ICD-10-CM | POA: Diagnosis not present

## 2021-05-09 DIAGNOSIS — Z5982 Transportation insecurity: Secondary | ICD-10-CM | POA: Diagnosis not present

## 2021-05-09 DIAGNOSIS — E039 Hypothyroidism, unspecified: Secondary | ICD-10-CM | POA: Diagnosis not present

## 2021-05-09 DIAGNOSIS — Z882 Allergy status to sulfonamides status: Secondary | ICD-10-CM | POA: Diagnosis not present

## 2021-05-09 DIAGNOSIS — Z885 Allergy status to narcotic agent status: Secondary | ICD-10-CM | POA: Diagnosis not present

## 2021-05-09 DIAGNOSIS — K5792 Diverticulitis of intestine, part unspecified, without perforation or abscess without bleeding: Secondary | ICD-10-CM | POA: Diagnosis not present

## 2021-05-09 DIAGNOSIS — K449 Diaphragmatic hernia without obstruction or gangrene: Secondary | ICD-10-CM | POA: Diagnosis not present

## 2021-05-09 DIAGNOSIS — K219 Gastro-esophageal reflux disease without esophagitis: Secondary | ICD-10-CM | POA: Diagnosis not present

## 2021-05-09 DIAGNOSIS — Z602 Problems related to living alone: Secondary | ICD-10-CM | POA: Diagnosis not present

## 2021-05-09 DIAGNOSIS — K5909 Other constipation: Secondary | ICD-10-CM | POA: Diagnosis not present

## 2021-05-09 DIAGNOSIS — R4781 Slurred speech: Secondary | ICD-10-CM | POA: Diagnosis not present

## 2021-05-09 DIAGNOSIS — Z79891 Long term (current) use of opiate analgesic: Secondary | ICD-10-CM | POA: Diagnosis not present

## 2021-05-09 DIAGNOSIS — Z87891 Personal history of nicotine dependence: Secondary | ICD-10-CM | POA: Diagnosis not present

## 2021-05-09 DIAGNOSIS — J449 Chronic obstructive pulmonary disease, unspecified: Secondary | ICD-10-CM | POA: Diagnosis not present

## 2021-05-09 DIAGNOSIS — R197 Diarrhea, unspecified: Secondary | ICD-10-CM | POA: Diagnosis not present

## 2021-05-09 DIAGNOSIS — Z880 Allergy status to penicillin: Secondary | ICD-10-CM | POA: Diagnosis not present

## 2021-05-09 DIAGNOSIS — I10 Essential (primary) hypertension: Secondary | ICD-10-CM | POA: Diagnosis not present

## 2021-05-09 DIAGNOSIS — R627 Adult failure to thrive: Secondary | ICD-10-CM | POA: Diagnosis not present

## 2021-05-09 DIAGNOSIS — Z881 Allergy status to other antibiotic agents status: Secondary | ICD-10-CM | POA: Diagnosis not present

## 2021-05-10 DIAGNOSIS — K5731 Diverticulosis of large intestine without perforation or abscess with bleeding: Secondary | ICD-10-CM | POA: Diagnosis not present

## 2021-05-10 DIAGNOSIS — I69322 Dysarthria following cerebral infarction: Secondary | ICD-10-CM | POA: Diagnosis not present

## 2021-05-10 DIAGNOSIS — Z880 Allergy status to penicillin: Secondary | ICD-10-CM | POA: Diagnosis not present

## 2021-05-10 DIAGNOSIS — Z602 Problems related to living alone: Secondary | ICD-10-CM | POA: Diagnosis not present

## 2021-05-10 DIAGNOSIS — Z79891 Long term (current) use of opiate analgesic: Secondary | ICD-10-CM | POA: Diagnosis not present

## 2021-05-10 DIAGNOSIS — K219 Gastro-esophageal reflux disease without esophagitis: Secondary | ICD-10-CM | POA: Diagnosis not present

## 2021-05-10 DIAGNOSIS — J449 Chronic obstructive pulmonary disease, unspecified: Secondary | ICD-10-CM | POA: Diagnosis not present

## 2021-05-10 DIAGNOSIS — K5790 Diverticulosis of intestine, part unspecified, without perforation or abscess without bleeding: Secondary | ICD-10-CM | POA: Diagnosis not present

## 2021-05-10 DIAGNOSIS — R4781 Slurred speech: Secondary | ICD-10-CM | POA: Diagnosis not present

## 2021-05-10 DIAGNOSIS — K625 Hemorrhage of anus and rectum: Secondary | ICD-10-CM | POA: Diagnosis not present

## 2021-05-10 DIAGNOSIS — Z885 Allergy status to narcotic agent status: Secondary | ICD-10-CM | POA: Diagnosis not present

## 2021-05-10 DIAGNOSIS — R627 Adult failure to thrive: Secondary | ICD-10-CM | POA: Diagnosis not present

## 2021-05-10 DIAGNOSIS — K5792 Diverticulitis of intestine, part unspecified, without perforation or abscess without bleeding: Secondary | ICD-10-CM | POA: Diagnosis not present

## 2021-05-10 DIAGNOSIS — K5909 Other constipation: Secondary | ICD-10-CM | POA: Diagnosis not present

## 2021-05-10 DIAGNOSIS — Z882 Allergy status to sulfonamides status: Secondary | ICD-10-CM | POA: Diagnosis not present

## 2021-05-10 DIAGNOSIS — Z01818 Encounter for other preprocedural examination: Secondary | ICD-10-CM | POA: Diagnosis not present

## 2021-05-10 DIAGNOSIS — Z87891 Personal history of nicotine dependence: Secondary | ICD-10-CM | POA: Diagnosis not present

## 2021-05-10 DIAGNOSIS — I10 Essential (primary) hypertension: Secondary | ICD-10-CM | POA: Diagnosis not present

## 2021-05-10 DIAGNOSIS — R197 Diarrhea, unspecified: Secondary | ICD-10-CM | POA: Diagnosis not present

## 2021-05-10 DIAGNOSIS — E039 Hypothyroidism, unspecified: Secondary | ICD-10-CM | POA: Diagnosis not present

## 2021-05-10 DIAGNOSIS — K449 Diaphragmatic hernia without obstruction or gangrene: Secondary | ICD-10-CM | POA: Diagnosis not present

## 2021-05-10 DIAGNOSIS — Z881 Allergy status to other antibiotic agents status: Secondary | ICD-10-CM | POA: Diagnosis not present

## 2021-05-10 DIAGNOSIS — Z5982 Transportation insecurity: Secondary | ICD-10-CM | POA: Diagnosis not present

## 2021-05-11 DIAGNOSIS — E039 Hypothyroidism, unspecified: Secondary | ICD-10-CM | POA: Diagnosis not present

## 2021-05-11 DIAGNOSIS — Z881 Allergy status to other antibiotic agents status: Secondary | ICD-10-CM | POA: Diagnosis not present

## 2021-05-11 DIAGNOSIS — Z5982 Transportation insecurity: Secondary | ICD-10-CM | POA: Diagnosis not present

## 2021-05-11 DIAGNOSIS — K449 Diaphragmatic hernia without obstruction or gangrene: Secondary | ICD-10-CM | POA: Diagnosis not present

## 2021-05-11 DIAGNOSIS — R4781 Slurred speech: Secondary | ICD-10-CM | POA: Diagnosis not present

## 2021-05-11 DIAGNOSIS — Z882 Allergy status to sulfonamides status: Secondary | ICD-10-CM | POA: Diagnosis not present

## 2021-05-11 DIAGNOSIS — K5790 Diverticulosis of intestine, part unspecified, without perforation or abscess without bleeding: Secondary | ICD-10-CM | POA: Diagnosis not present

## 2021-05-11 DIAGNOSIS — K5731 Diverticulosis of large intestine without perforation or abscess with bleeding: Secondary | ICD-10-CM | POA: Diagnosis not present

## 2021-05-11 DIAGNOSIS — K219 Gastro-esophageal reflux disease without esophagitis: Secondary | ICD-10-CM | POA: Diagnosis not present

## 2021-05-11 DIAGNOSIS — R627 Adult failure to thrive: Secondary | ICD-10-CM | POA: Diagnosis not present

## 2021-05-11 DIAGNOSIS — I10 Essential (primary) hypertension: Secondary | ICD-10-CM | POA: Diagnosis not present

## 2021-05-11 DIAGNOSIS — Z880 Allergy status to penicillin: Secondary | ICD-10-CM | POA: Diagnosis not present

## 2021-05-11 DIAGNOSIS — Z79891 Long term (current) use of opiate analgesic: Secondary | ICD-10-CM | POA: Diagnosis not present

## 2021-05-11 DIAGNOSIS — K5909 Other constipation: Secondary | ICD-10-CM | POA: Diagnosis not present

## 2021-05-11 DIAGNOSIS — I69322 Dysarthria following cerebral infarction: Secondary | ICD-10-CM | POA: Diagnosis not present

## 2021-05-11 DIAGNOSIS — Z602 Problems related to living alone: Secondary | ICD-10-CM | POA: Diagnosis not present

## 2021-05-11 DIAGNOSIS — R197 Diarrhea, unspecified: Secondary | ICD-10-CM | POA: Diagnosis not present

## 2021-05-11 DIAGNOSIS — K5792 Diverticulitis of intestine, part unspecified, without perforation or abscess without bleeding: Secondary | ICD-10-CM | POA: Diagnosis not present

## 2021-05-11 DIAGNOSIS — R638 Other symptoms and signs concerning food and fluid intake: Secondary | ICD-10-CM | POA: Diagnosis not present

## 2021-05-11 DIAGNOSIS — J449 Chronic obstructive pulmonary disease, unspecified: Secondary | ICD-10-CM | POA: Diagnosis not present

## 2021-05-11 DIAGNOSIS — Z885 Allergy status to narcotic agent status: Secondary | ICD-10-CM | POA: Diagnosis not present

## 2021-05-11 DIAGNOSIS — Z87891 Personal history of nicotine dependence: Secondary | ICD-10-CM | POA: Diagnosis not present

## 2021-05-12 DIAGNOSIS — Z87891 Personal history of nicotine dependence: Secondary | ICD-10-CM | POA: Diagnosis not present

## 2021-05-12 DIAGNOSIS — I69322 Dysarthria following cerebral infarction: Secondary | ICD-10-CM | POA: Diagnosis not present

## 2021-05-12 DIAGNOSIS — R9431 Abnormal electrocardiogram [ECG] [EKG]: Secondary | ICD-10-CM | POA: Diagnosis not present

## 2021-05-12 DIAGNOSIS — Z79891 Long term (current) use of opiate analgesic: Secondary | ICD-10-CM | POA: Diagnosis not present

## 2021-05-12 DIAGNOSIS — Z602 Problems related to living alone: Secondary | ICD-10-CM | POA: Diagnosis not present

## 2021-05-12 DIAGNOSIS — Z882 Allergy status to sulfonamides status: Secondary | ICD-10-CM | POA: Diagnosis not present

## 2021-05-12 DIAGNOSIS — K5792 Diverticulitis of intestine, part unspecified, without perforation or abscess without bleeding: Secondary | ICD-10-CM | POA: Diagnosis not present

## 2021-05-12 DIAGNOSIS — I10 Essential (primary) hypertension: Secondary | ICD-10-CM | POA: Diagnosis not present

## 2021-05-12 DIAGNOSIS — J449 Chronic obstructive pulmonary disease, unspecified: Secondary | ICD-10-CM | POA: Diagnosis not present

## 2021-05-12 DIAGNOSIS — Z880 Allergy status to penicillin: Secondary | ICD-10-CM | POA: Diagnosis not present

## 2021-05-12 DIAGNOSIS — Z885 Allergy status to narcotic agent status: Secondary | ICD-10-CM | POA: Diagnosis not present

## 2021-05-12 DIAGNOSIS — Z5982 Transportation insecurity: Secondary | ICD-10-CM | POA: Diagnosis not present

## 2021-05-12 DIAGNOSIS — K449 Diaphragmatic hernia without obstruction or gangrene: Secondary | ICD-10-CM | POA: Diagnosis not present

## 2021-05-12 DIAGNOSIS — K219 Gastro-esophageal reflux disease without esophagitis: Secondary | ICD-10-CM | POA: Diagnosis not present

## 2021-05-12 DIAGNOSIS — K5909 Other constipation: Secondary | ICD-10-CM | POA: Diagnosis not present

## 2021-05-12 DIAGNOSIS — Z881 Allergy status to other antibiotic agents status: Secondary | ICD-10-CM | POA: Diagnosis not present

## 2021-05-12 DIAGNOSIS — E039 Hypothyroidism, unspecified: Secondary | ICD-10-CM | POA: Diagnosis not present

## 2021-05-12 DIAGNOSIS — R4781 Slurred speech: Secondary | ICD-10-CM | POA: Diagnosis not present

## 2021-05-12 DIAGNOSIS — R197 Diarrhea, unspecified: Secondary | ICD-10-CM | POA: Diagnosis not present

## 2021-05-12 DIAGNOSIS — R627 Adult failure to thrive: Secondary | ICD-10-CM | POA: Diagnosis not present

## 2021-05-13 DIAGNOSIS — Z87891 Personal history of nicotine dependence: Secondary | ICD-10-CM | POA: Diagnosis not present

## 2021-05-13 DIAGNOSIS — Z79891 Long term (current) use of opiate analgesic: Secondary | ICD-10-CM | POA: Diagnosis not present

## 2021-05-13 DIAGNOSIS — R933 Abnormal findings on diagnostic imaging of other parts of digestive tract: Secondary | ICD-10-CM | POA: Diagnosis not present

## 2021-05-13 DIAGNOSIS — K449 Diaphragmatic hernia without obstruction or gangrene: Secondary | ICD-10-CM | POA: Diagnosis not present

## 2021-05-13 DIAGNOSIS — K5792 Diverticulitis of intestine, part unspecified, without perforation or abscess without bleeding: Secondary | ICD-10-CM | POA: Diagnosis not present

## 2021-05-13 DIAGNOSIS — K5790 Diverticulosis of intestine, part unspecified, without perforation or abscess without bleeding: Secondary | ICD-10-CM | POA: Diagnosis not present

## 2021-05-13 DIAGNOSIS — E039 Hypothyroidism, unspecified: Secondary | ICD-10-CM | POA: Diagnosis not present

## 2021-05-13 DIAGNOSIS — Z880 Allergy status to penicillin: Secondary | ICD-10-CM | POA: Diagnosis not present

## 2021-05-13 DIAGNOSIS — R4781 Slurred speech: Secondary | ICD-10-CM | POA: Diagnosis not present

## 2021-05-13 DIAGNOSIS — Z885 Allergy status to narcotic agent status: Secondary | ICD-10-CM | POA: Diagnosis not present

## 2021-05-13 DIAGNOSIS — K5909 Other constipation: Secondary | ICD-10-CM | POA: Diagnosis not present

## 2021-05-13 DIAGNOSIS — I10 Essential (primary) hypertension: Secondary | ICD-10-CM | POA: Diagnosis not present

## 2021-05-13 DIAGNOSIS — K219 Gastro-esophageal reflux disease without esophagitis: Secondary | ICD-10-CM | POA: Diagnosis not present

## 2021-05-13 DIAGNOSIS — J449 Chronic obstructive pulmonary disease, unspecified: Secondary | ICD-10-CM | POA: Diagnosis not present

## 2021-05-13 DIAGNOSIS — R197 Diarrhea, unspecified: Secondary | ICD-10-CM | POA: Diagnosis not present

## 2021-05-13 DIAGNOSIS — R627 Adult failure to thrive: Secondary | ICD-10-CM | POA: Diagnosis not present

## 2021-05-13 DIAGNOSIS — Z602 Problems related to living alone: Secondary | ICD-10-CM | POA: Diagnosis not present

## 2021-05-13 DIAGNOSIS — K3189 Other diseases of stomach and duodenum: Secondary | ICD-10-CM | POA: Diagnosis not present

## 2021-05-13 DIAGNOSIS — Z882 Allergy status to sulfonamides status: Secondary | ICD-10-CM | POA: Diagnosis not present

## 2021-05-13 DIAGNOSIS — Z881 Allergy status to other antibiotic agents status: Secondary | ICD-10-CM | POA: Diagnosis not present

## 2021-05-13 DIAGNOSIS — Z5982 Transportation insecurity: Secondary | ICD-10-CM | POA: Diagnosis not present

## 2021-05-13 DIAGNOSIS — I69322 Dysarthria following cerebral infarction: Secondary | ICD-10-CM | POA: Diagnosis not present

## 2021-05-14 DIAGNOSIS — K5909 Other constipation: Secondary | ICD-10-CM | POA: Diagnosis not present

## 2021-05-14 DIAGNOSIS — R471 Dysarthria and anarthria: Secondary | ICD-10-CM | POA: Diagnosis not present

## 2021-05-14 DIAGNOSIS — K449 Diaphragmatic hernia without obstruction or gangrene: Secondary | ICD-10-CM | POA: Diagnosis not present

## 2021-05-14 DIAGNOSIS — R4781 Slurred speech: Secondary | ICD-10-CM | POA: Diagnosis not present

## 2021-05-14 DIAGNOSIS — E039 Hypothyroidism, unspecified: Secondary | ICD-10-CM | POA: Diagnosis not present

## 2021-05-14 DIAGNOSIS — Z881 Allergy status to other antibiotic agents status: Secondary | ICD-10-CM | POA: Diagnosis not present

## 2021-05-14 DIAGNOSIS — Z880 Allergy status to penicillin: Secondary | ICD-10-CM | POA: Diagnosis not present

## 2021-05-14 DIAGNOSIS — Z602 Problems related to living alone: Secondary | ICD-10-CM | POA: Diagnosis not present

## 2021-05-14 DIAGNOSIS — I10 Essential (primary) hypertension: Secondary | ICD-10-CM | POA: Diagnosis not present

## 2021-05-14 DIAGNOSIS — R197 Diarrhea, unspecified: Secondary | ICD-10-CM | POA: Diagnosis not present

## 2021-05-14 DIAGNOSIS — Z882 Allergy status to sulfonamides status: Secondary | ICD-10-CM | POA: Diagnosis not present

## 2021-05-14 DIAGNOSIS — Z5982 Transportation insecurity: Secondary | ICD-10-CM | POA: Diagnosis not present

## 2021-05-14 DIAGNOSIS — R208 Other disturbances of skin sensation: Secondary | ICD-10-CM | POA: Diagnosis not present

## 2021-05-14 DIAGNOSIS — K5792 Diverticulitis of intestine, part unspecified, without perforation or abscess without bleeding: Secondary | ICD-10-CM | POA: Diagnosis not present

## 2021-05-14 DIAGNOSIS — B9681 Helicobacter pylori [H. pylori] as the cause of diseases classified elsewhere: Secondary | ICD-10-CM | POA: Diagnosis not present

## 2021-05-14 DIAGNOSIS — Z79891 Long term (current) use of opiate analgesic: Secondary | ICD-10-CM | POA: Diagnosis not present

## 2021-05-14 DIAGNOSIS — R627 Adult failure to thrive: Secondary | ICD-10-CM | POA: Diagnosis not present

## 2021-05-14 DIAGNOSIS — Z87891 Personal history of nicotine dependence: Secondary | ICD-10-CM | POA: Diagnosis not present

## 2021-05-14 DIAGNOSIS — J449 Chronic obstructive pulmonary disease, unspecified: Secondary | ICD-10-CM | POA: Diagnosis not present

## 2021-05-14 DIAGNOSIS — I69322 Dysarthria following cerebral infarction: Secondary | ICD-10-CM | POA: Diagnosis not present

## 2021-05-14 DIAGNOSIS — K219 Gastro-esophageal reflux disease without esophagitis: Secondary | ICD-10-CM | POA: Diagnosis not present

## 2021-05-14 DIAGNOSIS — Z885 Allergy status to narcotic agent status: Secondary | ICD-10-CM | POA: Diagnosis not present

## 2021-05-15 DIAGNOSIS — I69322 Dysarthria following cerebral infarction: Secondary | ICD-10-CM | POA: Diagnosis not present

## 2021-05-15 DIAGNOSIS — Z882 Allergy status to sulfonamides status: Secondary | ICD-10-CM | POA: Diagnosis not present

## 2021-05-15 DIAGNOSIS — R627 Adult failure to thrive: Secondary | ICD-10-CM | POA: Diagnosis not present

## 2021-05-15 DIAGNOSIS — Z5982 Transportation insecurity: Secondary | ICD-10-CM | POA: Diagnosis not present

## 2021-05-15 DIAGNOSIS — Z87891 Personal history of nicotine dependence: Secondary | ICD-10-CM | POA: Diagnosis not present

## 2021-05-15 DIAGNOSIS — E039 Hypothyroidism, unspecified: Secondary | ICD-10-CM | POA: Diagnosis not present

## 2021-05-15 DIAGNOSIS — Z885 Allergy status to narcotic agent status: Secondary | ICD-10-CM | POA: Diagnosis not present

## 2021-05-15 DIAGNOSIS — K5792 Diverticulitis of intestine, part unspecified, without perforation or abscess without bleeding: Secondary | ICD-10-CM | POA: Diagnosis not present

## 2021-05-15 DIAGNOSIS — I10 Essential (primary) hypertension: Secondary | ICD-10-CM | POA: Diagnosis not present

## 2021-05-15 DIAGNOSIS — Z881 Allergy status to other antibiotic agents status: Secondary | ICD-10-CM | POA: Diagnosis not present

## 2021-05-15 DIAGNOSIS — K449 Diaphragmatic hernia without obstruction or gangrene: Secondary | ICD-10-CM | POA: Diagnosis not present

## 2021-05-15 DIAGNOSIS — Z880 Allergy status to penicillin: Secondary | ICD-10-CM | POA: Diagnosis not present

## 2021-05-15 DIAGNOSIS — Z602 Problems related to living alone: Secondary | ICD-10-CM | POA: Diagnosis not present

## 2021-05-15 DIAGNOSIS — K639 Disease of intestine, unspecified: Secondary | ICD-10-CM | POA: Diagnosis not present

## 2021-05-15 DIAGNOSIS — K219 Gastro-esophageal reflux disease without esophagitis: Secondary | ICD-10-CM | POA: Diagnosis not present

## 2021-05-15 DIAGNOSIS — R197 Diarrhea, unspecified: Secondary | ICD-10-CM | POA: Diagnosis not present

## 2021-05-15 DIAGNOSIS — J449 Chronic obstructive pulmonary disease, unspecified: Secondary | ICD-10-CM | POA: Diagnosis not present

## 2021-05-15 DIAGNOSIS — R4781 Slurred speech: Secondary | ICD-10-CM | POA: Diagnosis not present

## 2021-05-15 DIAGNOSIS — K5909 Other constipation: Secondary | ICD-10-CM | POA: Diagnosis not present

## 2021-05-15 DIAGNOSIS — Z79891 Long term (current) use of opiate analgesic: Secondary | ICD-10-CM | POA: Diagnosis not present

## 2021-05-15 DIAGNOSIS — R471 Dysarthria and anarthria: Secondary | ICD-10-CM | POA: Diagnosis not present

## 2021-05-16 DIAGNOSIS — K5909 Other constipation: Secondary | ICD-10-CM | POA: Diagnosis not present

## 2021-05-16 DIAGNOSIS — R197 Diarrhea, unspecified: Secondary | ICD-10-CM | POA: Diagnosis not present

## 2021-05-16 DIAGNOSIS — Z602 Problems related to living alone: Secondary | ICD-10-CM | POA: Diagnosis not present

## 2021-05-16 DIAGNOSIS — Z882 Allergy status to sulfonamides status: Secondary | ICD-10-CM | POA: Diagnosis not present

## 2021-05-16 DIAGNOSIS — R933 Abnormal findings on diagnostic imaging of other parts of digestive tract: Secondary | ICD-10-CM | POA: Diagnosis not present

## 2021-05-16 DIAGNOSIS — K5790 Diverticulosis of intestine, part unspecified, without perforation or abscess without bleeding: Secondary | ICD-10-CM | POA: Diagnosis not present

## 2021-05-16 DIAGNOSIS — Z87891 Personal history of nicotine dependence: Secondary | ICD-10-CM | POA: Diagnosis not present

## 2021-05-16 DIAGNOSIS — E039 Hypothyroidism, unspecified: Secondary | ICD-10-CM | POA: Diagnosis not present

## 2021-05-16 DIAGNOSIS — J449 Chronic obstructive pulmonary disease, unspecified: Secondary | ICD-10-CM | POA: Diagnosis not present

## 2021-05-16 DIAGNOSIS — Z79891 Long term (current) use of opiate analgesic: Secondary | ICD-10-CM | POA: Diagnosis not present

## 2021-05-16 DIAGNOSIS — Z885 Allergy status to narcotic agent status: Secondary | ICD-10-CM | POA: Diagnosis not present

## 2021-05-16 DIAGNOSIS — K449 Diaphragmatic hernia without obstruction or gangrene: Secondary | ICD-10-CM | POA: Diagnosis not present

## 2021-05-16 DIAGNOSIS — Z5982 Transportation insecurity: Secondary | ICD-10-CM | POA: Diagnosis not present

## 2021-05-16 DIAGNOSIS — I10 Essential (primary) hypertension: Secondary | ICD-10-CM | POA: Diagnosis not present

## 2021-05-16 DIAGNOSIS — R627 Adult failure to thrive: Secondary | ICD-10-CM | POA: Diagnosis not present

## 2021-05-16 DIAGNOSIS — K5792 Diverticulitis of intestine, part unspecified, without perforation or abscess without bleeding: Secondary | ICD-10-CM | POA: Diagnosis not present

## 2021-05-16 DIAGNOSIS — K529 Noninfective gastroenteritis and colitis, unspecified: Secondary | ICD-10-CM | POA: Diagnosis not present

## 2021-05-16 DIAGNOSIS — I69322 Dysarthria following cerebral infarction: Secondary | ICD-10-CM | POA: Diagnosis not present

## 2021-05-16 DIAGNOSIS — Z880 Allergy status to penicillin: Secondary | ICD-10-CM | POA: Diagnosis not present

## 2021-05-16 DIAGNOSIS — R4781 Slurred speech: Secondary | ICD-10-CM | POA: Diagnosis not present

## 2021-05-16 DIAGNOSIS — R471 Dysarthria and anarthria: Secondary | ICD-10-CM | POA: Diagnosis not present

## 2021-05-16 DIAGNOSIS — B9681 Helicobacter pylori [H. pylori] as the cause of diseases classified elsewhere: Secondary | ICD-10-CM | POA: Diagnosis not present

## 2021-05-16 DIAGNOSIS — Z881 Allergy status to other antibiotic agents status: Secondary | ICD-10-CM | POA: Diagnosis not present

## 2021-05-16 DIAGNOSIS — K219 Gastro-esophageal reflux disease without esophagitis: Secondary | ICD-10-CM | POA: Diagnosis not present

## 2021-05-17 DIAGNOSIS — K5792 Diverticulitis of intestine, part unspecified, without perforation or abscess without bleeding: Secondary | ICD-10-CM | POA: Diagnosis not present

## 2021-05-17 DIAGNOSIS — R4781 Slurred speech: Secondary | ICD-10-CM | POA: Diagnosis not present

## 2021-05-17 DIAGNOSIS — R197 Diarrhea, unspecified: Secondary | ICD-10-CM | POA: Diagnosis not present

## 2021-05-17 DIAGNOSIS — Z87891 Personal history of nicotine dependence: Secondary | ICD-10-CM | POA: Diagnosis not present

## 2021-05-17 DIAGNOSIS — K219 Gastro-esophageal reflux disease without esophagitis: Secondary | ICD-10-CM | POA: Diagnosis not present

## 2021-05-17 DIAGNOSIS — R627 Adult failure to thrive: Secondary | ICD-10-CM | POA: Diagnosis not present

## 2021-05-17 DIAGNOSIS — Z5982 Transportation insecurity: Secondary | ICD-10-CM | POA: Diagnosis not present

## 2021-05-17 DIAGNOSIS — E039 Hypothyroidism, unspecified: Secondary | ICD-10-CM | POA: Diagnosis not present

## 2021-05-17 DIAGNOSIS — K449 Diaphragmatic hernia without obstruction or gangrene: Secondary | ICD-10-CM | POA: Diagnosis not present

## 2021-05-17 DIAGNOSIS — K5909 Other constipation: Secondary | ICD-10-CM | POA: Diagnosis not present

## 2021-05-17 DIAGNOSIS — Z885 Allergy status to narcotic agent status: Secondary | ICD-10-CM | POA: Diagnosis not present

## 2021-05-17 DIAGNOSIS — Z79891 Long term (current) use of opiate analgesic: Secondary | ICD-10-CM | POA: Diagnosis not present

## 2021-05-17 DIAGNOSIS — I69322 Dysarthria following cerebral infarction: Secondary | ICD-10-CM | POA: Diagnosis not present

## 2021-05-17 DIAGNOSIS — Z882 Allergy status to sulfonamides status: Secondary | ICD-10-CM | POA: Diagnosis not present

## 2021-05-17 DIAGNOSIS — Z602 Problems related to living alone: Secondary | ICD-10-CM | POA: Diagnosis not present

## 2021-05-17 DIAGNOSIS — Z881 Allergy status to other antibiotic agents status: Secondary | ICD-10-CM | POA: Diagnosis not present

## 2021-05-17 DIAGNOSIS — I10 Essential (primary) hypertension: Secondary | ICD-10-CM | POA: Diagnosis not present

## 2021-05-17 DIAGNOSIS — Z880 Allergy status to penicillin: Secondary | ICD-10-CM | POA: Diagnosis not present

## 2021-05-17 DIAGNOSIS — J449 Chronic obstructive pulmonary disease, unspecified: Secondary | ICD-10-CM | POA: Diagnosis not present

## 2021-05-18 DIAGNOSIS — K5792 Diverticulitis of intestine, part unspecified, without perforation or abscess without bleeding: Secondary | ICD-10-CM | POA: Diagnosis not present

## 2021-05-18 DIAGNOSIS — J449 Chronic obstructive pulmonary disease, unspecified: Secondary | ICD-10-CM | POA: Diagnosis not present

## 2021-05-18 DIAGNOSIS — Z880 Allergy status to penicillin: Secondary | ICD-10-CM | POA: Diagnosis not present

## 2021-05-18 DIAGNOSIS — Z5982 Transportation insecurity: Secondary | ICD-10-CM | POA: Diagnosis not present

## 2021-05-18 DIAGNOSIS — Z885 Allergy status to narcotic agent status: Secondary | ICD-10-CM | POA: Diagnosis not present

## 2021-05-18 DIAGNOSIS — K449 Diaphragmatic hernia without obstruction or gangrene: Secondary | ICD-10-CM | POA: Diagnosis not present

## 2021-05-18 DIAGNOSIS — I69322 Dysarthria following cerebral infarction: Secondary | ICD-10-CM | POA: Diagnosis not present

## 2021-05-18 DIAGNOSIS — K5909 Other constipation: Secondary | ICD-10-CM | POA: Diagnosis not present

## 2021-05-18 DIAGNOSIS — R4781 Slurred speech: Secondary | ICD-10-CM | POA: Diagnosis not present

## 2021-05-18 DIAGNOSIS — Z881 Allergy status to other antibiotic agents status: Secondary | ICD-10-CM | POA: Diagnosis not present

## 2021-05-18 DIAGNOSIS — K219 Gastro-esophageal reflux disease without esophagitis: Secondary | ICD-10-CM | POA: Diagnosis not present

## 2021-05-18 DIAGNOSIS — R197 Diarrhea, unspecified: Secondary | ICD-10-CM | POA: Diagnosis not present

## 2021-05-18 DIAGNOSIS — Z602 Problems related to living alone: Secondary | ICD-10-CM | POA: Diagnosis not present

## 2021-05-18 DIAGNOSIS — E039 Hypothyroidism, unspecified: Secondary | ICD-10-CM | POA: Diagnosis not present

## 2021-05-18 DIAGNOSIS — Z87891 Personal history of nicotine dependence: Secondary | ICD-10-CM | POA: Diagnosis not present

## 2021-05-18 DIAGNOSIS — R627 Adult failure to thrive: Secondary | ICD-10-CM | POA: Diagnosis not present

## 2021-05-18 DIAGNOSIS — Z79891 Long term (current) use of opiate analgesic: Secondary | ICD-10-CM | POA: Diagnosis not present

## 2021-05-18 DIAGNOSIS — I10 Essential (primary) hypertension: Secondary | ICD-10-CM | POA: Diagnosis not present

## 2021-05-18 DIAGNOSIS — Z882 Allergy status to sulfonamides status: Secondary | ICD-10-CM | POA: Diagnosis not present

## 2021-05-19 DIAGNOSIS — K5909 Other constipation: Secondary | ICD-10-CM | POA: Diagnosis not present

## 2021-05-19 DIAGNOSIS — R4781 Slurred speech: Secondary | ICD-10-CM | POA: Diagnosis not present

## 2021-05-19 DIAGNOSIS — Z880 Allergy status to penicillin: Secondary | ICD-10-CM | POA: Diagnosis not present

## 2021-05-19 DIAGNOSIS — K449 Diaphragmatic hernia without obstruction or gangrene: Secondary | ICD-10-CM | POA: Diagnosis not present

## 2021-05-19 DIAGNOSIS — K5792 Diverticulitis of intestine, part unspecified, without perforation or abscess without bleeding: Secondary | ICD-10-CM | POA: Diagnosis not present

## 2021-05-19 DIAGNOSIS — Z881 Allergy status to other antibiotic agents status: Secondary | ICD-10-CM | POA: Diagnosis not present

## 2021-05-19 DIAGNOSIS — Z882 Allergy status to sulfonamides status: Secondary | ICD-10-CM | POA: Diagnosis not present

## 2021-05-19 DIAGNOSIS — R197 Diarrhea, unspecified: Secondary | ICD-10-CM | POA: Diagnosis not present

## 2021-05-19 DIAGNOSIS — E039 Hypothyroidism, unspecified: Secondary | ICD-10-CM | POA: Diagnosis not present

## 2021-05-19 DIAGNOSIS — Z602 Problems related to living alone: Secondary | ICD-10-CM | POA: Diagnosis not present

## 2021-05-19 DIAGNOSIS — Z885 Allergy status to narcotic agent status: Secondary | ICD-10-CM | POA: Diagnosis not present

## 2021-05-19 DIAGNOSIS — K219 Gastro-esophageal reflux disease without esophagitis: Secondary | ICD-10-CM | POA: Diagnosis not present

## 2021-05-19 DIAGNOSIS — J449 Chronic obstructive pulmonary disease, unspecified: Secondary | ICD-10-CM | POA: Diagnosis not present

## 2021-05-19 DIAGNOSIS — I69322 Dysarthria following cerebral infarction: Secondary | ICD-10-CM | POA: Diagnosis not present

## 2021-05-19 DIAGNOSIS — K5790 Diverticulosis of intestine, part unspecified, without perforation or abscess without bleeding: Secondary | ICD-10-CM | POA: Diagnosis not present

## 2021-05-19 DIAGNOSIS — I10 Essential (primary) hypertension: Secondary | ICD-10-CM | POA: Diagnosis not present

## 2021-05-19 DIAGNOSIS — Z79891 Long term (current) use of opiate analgesic: Secondary | ICD-10-CM | POA: Diagnosis not present

## 2021-05-19 DIAGNOSIS — Z5982 Transportation insecurity: Secondary | ICD-10-CM | POA: Diagnosis not present

## 2021-05-19 DIAGNOSIS — R627 Adult failure to thrive: Secondary | ICD-10-CM | POA: Diagnosis not present

## 2021-05-19 DIAGNOSIS — Z87891 Personal history of nicotine dependence: Secondary | ICD-10-CM | POA: Diagnosis not present

## 2021-05-24 DIAGNOSIS — K529 Noninfective gastroenteritis and colitis, unspecified: Secondary | ICD-10-CM | POA: Diagnosis not present

## 2021-05-24 DIAGNOSIS — R197 Diarrhea, unspecified: Secondary | ICD-10-CM | POA: Diagnosis not present

## 2021-05-24 DIAGNOSIS — Z885 Allergy status to narcotic agent status: Secondary | ICD-10-CM | POA: Diagnosis not present

## 2021-05-24 DIAGNOSIS — E162 Hypoglycemia, unspecified: Secondary | ICD-10-CM | POA: Diagnosis not present

## 2021-05-24 DIAGNOSIS — M7989 Other specified soft tissue disorders: Secondary | ICD-10-CM | POA: Diagnosis not present

## 2021-05-24 DIAGNOSIS — J449 Chronic obstructive pulmonary disease, unspecified: Secondary | ICD-10-CM | POA: Diagnosis not present

## 2021-05-24 DIAGNOSIS — E860 Dehydration: Secondary | ICD-10-CM | POA: Diagnosis not present

## 2021-05-24 DIAGNOSIS — R627 Adult failure to thrive: Secondary | ICD-10-CM | POA: Diagnosis not present

## 2021-05-24 DIAGNOSIS — Z5941 Food insecurity: Secondary | ICD-10-CM | POA: Diagnosis not present

## 2021-05-24 DIAGNOSIS — Z5982 Transportation insecurity: Secondary | ICD-10-CM | POA: Diagnosis not present

## 2021-05-24 DIAGNOSIS — Z881 Allergy status to other antibiotic agents status: Secondary | ICD-10-CM | POA: Diagnosis not present

## 2021-05-24 DIAGNOSIS — Z882 Allergy status to sulfonamides status: Secondary | ICD-10-CM | POA: Diagnosis not present

## 2021-05-24 DIAGNOSIS — R1031 Right lower quadrant pain: Secondary | ICD-10-CM | POA: Diagnosis not present

## 2021-05-24 DIAGNOSIS — Z743 Need for continuous supervision: Secondary | ICD-10-CM | POA: Diagnosis not present

## 2021-05-24 DIAGNOSIS — I10 Essential (primary) hypertension: Secondary | ICD-10-CM | POA: Diagnosis not present

## 2021-05-24 DIAGNOSIS — E039 Hypothyroidism, unspecified: Secondary | ICD-10-CM | POA: Diagnosis not present

## 2021-05-24 DIAGNOSIS — K449 Diaphragmatic hernia without obstruction or gangrene: Secondary | ICD-10-CM | POA: Diagnosis not present

## 2021-05-24 DIAGNOSIS — R109 Unspecified abdominal pain: Secondary | ICD-10-CM | POA: Diagnosis not present

## 2021-05-24 DIAGNOSIS — R112 Nausea with vomiting, unspecified: Secondary | ICD-10-CM | POA: Diagnosis not present

## 2021-05-24 DIAGNOSIS — R339 Retention of urine, unspecified: Secondary | ICD-10-CM | POA: Diagnosis not present

## 2021-05-24 DIAGNOSIS — I672 Cerebral atherosclerosis: Secondary | ICD-10-CM | POA: Diagnosis not present

## 2021-05-24 DIAGNOSIS — K219 Gastro-esophageal reflux disease without esophagitis: Secondary | ICD-10-CM | POA: Diagnosis not present

## 2021-05-24 DIAGNOSIS — Z7902 Long term (current) use of antithrombotics/antiplatelets: Secondary | ICD-10-CM | POA: Diagnosis not present

## 2021-05-24 DIAGNOSIS — Z886 Allergy status to analgesic agent status: Secondary | ICD-10-CM | POA: Diagnosis not present

## 2021-05-24 DIAGNOSIS — Z8673 Personal history of transient ischemic attack (TIA), and cerebral infarction without residual deficits: Secondary | ICD-10-CM | POA: Diagnosis not present

## 2021-05-25 DIAGNOSIS — R1031 Right lower quadrant pain: Secondary | ICD-10-CM | POA: Diagnosis not present

## 2021-05-26 DIAGNOSIS — R1031 Right lower quadrant pain: Secondary | ICD-10-CM | POA: Diagnosis not present

## 2021-05-27 DIAGNOSIS — R1031 Right lower quadrant pain: Secondary | ICD-10-CM | POA: Diagnosis not present

## 2021-06-09 DIAGNOSIS — E039 Hypothyroidism, unspecified: Secondary | ICD-10-CM | POA: Diagnosis not present

## 2021-06-09 DIAGNOSIS — H9191 Unspecified hearing loss, right ear: Secondary | ICD-10-CM | POA: Diagnosis not present

## 2021-06-09 DIAGNOSIS — E538 Deficiency of other specified B group vitamins: Secondary | ICD-10-CM | POA: Diagnosis not present

## 2021-06-09 DIAGNOSIS — Z79899 Other long term (current) drug therapy: Secondary | ICD-10-CM | POA: Diagnosis not present

## 2021-06-09 DIAGNOSIS — K529 Noninfective gastroenteritis and colitis, unspecified: Secondary | ICD-10-CM | POA: Diagnosis not present

## 2021-06-09 DIAGNOSIS — Z09 Encounter for follow-up examination after completed treatment for conditions other than malignant neoplasm: Secondary | ICD-10-CM | POA: Diagnosis not present

## 2021-06-09 DIAGNOSIS — R21 Rash and other nonspecific skin eruption: Secondary | ICD-10-CM | POA: Diagnosis not present

## 2021-06-09 DIAGNOSIS — K120 Recurrent oral aphthae: Secondary | ICD-10-CM | POA: Diagnosis not present

## 2021-06-09 DIAGNOSIS — G459 Transient cerebral ischemic attack, unspecified: Secondary | ICD-10-CM | POA: Diagnosis not present

## 2021-06-09 DIAGNOSIS — K2100 Gastro-esophageal reflux disease with esophagitis, without bleeding: Secondary | ICD-10-CM | POA: Diagnosis not present

## 2021-06-09 DIAGNOSIS — I10 Essential (primary) hypertension: Secondary | ICD-10-CM | POA: Diagnosis not present

## 2021-06-14 DIAGNOSIS — B372 Candidiasis of skin and nail: Secondary | ICD-10-CM | POA: Diagnosis not present

## 2021-06-28 DIAGNOSIS — K219 Gastro-esophageal reflux disease without esophagitis: Secondary | ICD-10-CM | POA: Diagnosis not present

## 2021-06-28 DIAGNOSIS — M79642 Pain in left hand: Secondary | ICD-10-CM | POA: Diagnosis not present

## 2021-06-28 DIAGNOSIS — M542 Cervicalgia: Secondary | ICD-10-CM | POA: Diagnosis not present

## 2021-06-28 DIAGNOSIS — M79641 Pain in right hand: Secondary | ICD-10-CM | POA: Diagnosis not present

## 2021-06-30 ENCOUNTER — Emergency Department
Admission: EM | Admit: 2021-06-30 | Discharge: 2021-07-01 | Disposition: A | Discharge status: Home / Self Care | Payer: Medicare Other | Attending: Emergency Medicine | Admitting: Emergency Medicine

## 2021-06-30 ENCOUNTER — Emergency Department: Payer: Medicare Other

## 2021-06-30 ENCOUNTER — Encounter: Payer: Self-pay | Admitting: Emergency Medicine

## 2021-06-30 DIAGNOSIS — J9811 Atelectasis: Secondary | ICD-10-CM | POA: Diagnosis not present

## 2021-06-30 DIAGNOSIS — K449 Diaphragmatic hernia without obstruction or gangrene: Secondary | ICD-10-CM | POA: Diagnosis not present

## 2021-06-30 DIAGNOSIS — J449 Chronic obstructive pulmonary disease, unspecified: Secondary | ICD-10-CM | POA: Diagnosis not present

## 2021-06-30 DIAGNOSIS — R0602 Shortness of breath: Secondary | ICD-10-CM | POA: Diagnosis not present

## 2021-06-30 MED ORDER — SODIUM CHLORIDE 0.9 % IV BOLUS
500.0000 mL | Freq: Once | INTRAVENOUS | Status: AC
  Administered 2021-07-01: 500 mL via INTRAVENOUS

## 2021-06-30 MED ORDER — ALBUTEROL SULFATE (2.5 MG/3ML) 0.083% IN NEBU
2.5000 mg | INHALATION_SOLUTION | Freq: Once | RESPIRATORY_TRACT | Status: AC
  Administered 2021-06-30: 2.5 mg via RESPIRATORY_TRACT
  Filled 2021-06-30: qty 3

## 2021-06-30 MED ORDER — DEXAMETHASONE SODIUM PHOSPHATE 10 MG/ML IJ SOLN
6.0000 mg | Freq: Once | INTRAMUSCULAR | Status: AC
  Administered 2021-07-01: 6 mg via INTRAVENOUS
  Filled 2021-06-30: qty 1

## 2021-06-30 NOTE — ED Triage Notes (Signed)
Pt presents via POV with complaints of SOB for several weeks which she thinks is an allergic reaction to black mold in her apartment. She notes that she needs a breathing treatment due to the increased SOB - she notes using her rescue inhaler more often. Denies CP.

## 2021-06-30 NOTE — ED Notes (Signed)
Pt declined lab work at this time.

## 2021-06-30 NOTE — ED Provider Notes (Signed)
[facility] Provider Note    Event Date/Time   First MD Initiated Contact with Patient 06/30/21 2308     (approximate)   History   Shortness of Breath   HPI  Angela [name] is a 71 y.o. female  who, per discharge note from outside hospital dated 05/27/21 has history of COPD, who presents to the emergency department today because of concern for shortness of breath.  Patient states that she thinks she is reacting to mold that is in her apartment.  This has been going on for couple weeks.  She states that she is having a hard time using her inhaler.  She feels like she needs help opening up her chest [place] also states that she feels dehydrated.  Physical Exam   Triage Vital Signs: ED Triage Vitals  Enc Vitals Group     BP 06/30/21 2119 (!) 179/97     Pulse Rate 06/30/21 2119 97     Resp 06/30/21 2119 20     Temp 06/30/21 2119 99.1 F (37.3 C)     Temp Source 06/30/21 2119 Oral     SpO2 06/30/21 2119 98 %     Weight 06/30/21 2121 152 lb 1.9 oz (69 kg)     Height 06/30/21 2121 '5\' 3"'$  (1.6 m)     Head Circumference --      Peak Flow --      Pain Score --      Pain Loc --      Pain Edu? --      Excl. in [place]? --     Most recent vital signs: Vitals:   06/30/21 2119  BP: (!) 179/97  Pulse: 97  Resp: 20  Temp: 99.1 F (37.3 C)  SpO2: 98%    General: Awake, alert, oriented. CV:  Good peripheral perfusion. Regular rate and rhythm. Resp:  Normal effort. Lungs clear. Abd:  No distention.    ED Results / Procedures / Treatments   Labs (all labs ordered are listed, but only abnormal results are displayed) Labs Reviewed  CBC WITH DIFFERENTIAL/PLATELET  COMPREHENSIVE METABOLIC PANEL     EKG  I, Nance Pear, attending physician, personally viewed and interpreted this EKG  EKG Time: 2125 Rate: 91 Rhythm: normal sinus rhythm Axis: rightward axis Intervals: qtc 437 QRS: narrow ST changes: no st elevation Impression: abnormal  ekg   RADIOLOGY I independently interpreted and visualized the CXR. My interpretation: No pneumonia. No pneumothorax.  Radiology interpretation:  IMPRESSION:  Large retrocardiac hiatal hernia with right basilar atelectasis.    PROCEDURES:  Critical Care performed: No  Procedures   MEDICATIONS ORDERED IN ED: Medications - No data to display   IMPRESSION / MDM / [place] / ED COURSE  I reviewed the triage vital signs and the nursing notes.                              Differential diagnosis includes, but is not limited to, pneumonia, pneumothorax, COPD, CHF.  Patient's presentation is most consistent with acute presentation with potential threat to life or bodily function.  Patient presented to the emergency department today with primary concern for shortness of breath.  Patient had chest x-ray performed which did not show any pneumonia or pneumothorax.  Showed large hiatal hernia which patient is aware of.  CBC without leukocytosis or concerning anemia.  Unfortunately BMP hemolyzed.  However did review blood work over the  past couple months patient has not had any significant electrolyte abnormalities and I have low suspicion for any today.  Patient is not hypoxic and in no significant respiratory distress.  Do feel patient is safe for follow-up with outpatient provider.  FINAL CLINICAL IMPRESSION(S) / ED DIAGNOSES   Final diagnoses:  Shortness of breath    Note:  This document was prepared using Dragon voice recognition software and may include unintentional dictation errors.    Nance Pear, MD 07/01/21 [phone]

## 2021-07-01 DIAGNOSIS — R0602 Shortness of breath: Secondary | ICD-10-CM | POA: Diagnosis not present

## 2021-07-01 LAB — CBC WITH DIFFERENTIAL/PLATELET
Abs Immature Granulocytes: 0.02 10*3/uL (ref 0.00–0.07)
Basophils Absolute: 0 10*3/uL (ref 0.0–0.1)
Basophils Relative: 1 %
Eosinophils Absolute: 0.1 10*3/uL (ref 0.0–0.5)
Eosinophils Relative: 2 %
HCT: 39.1 % (ref 36.0–46.0)
Hemoglobin: 12.8 g/dL (ref 12.0–15.0)
Immature Granulocytes: 0 %
Lymphocytes Relative: 31 %
Lymphs Abs: 1.9 10*3/uL (ref 0.7–4.0)
MCH: 29.4 pg (ref 26.0–34.0)
MCHC: 32.7 g/dL (ref 30.0–36.0)
MCV: 89.9 fL (ref 80.0–100.0)
Monocytes Absolute: 0.6 10*3/uL (ref 0.1–1.0)
Monocytes Relative: 10 %
Neutro Abs: 3.5 10*3/uL (ref 1.7–7.7)
Neutrophils Relative %: 56 %
Platelets: 237 10*3/uL (ref 150–400)
RBC: 4.35 MIL/uL (ref 3.87–5.11)
RDW: 13.9 % (ref 11.5–15.5)
WBC: 6.2 10*3/uL (ref 4.0–10.5)
nRBC: 0 % (ref 0.0–0.2)

## 2021-07-01 NOTE — Discharge Instructions (Addendum)
Please seek medical attention for any high fevers, chest pain, shortness of breath, change in behavior, persistent vomiting, bloody stool or any other new or concerning symptoms.  

## 2021-07-01 NOTE — ED Notes (Signed)
IV team at bedside 

## 2021-07-04 DIAGNOSIS — H1045 Other chronic allergic conjunctivitis: Secondary | ICD-10-CM | POA: Diagnosis not present

## 2021-07-04 DIAGNOSIS — J301 Allergic rhinitis due to pollen: Secondary | ICD-10-CM | POA: Diagnosis not present

## 2021-07-04 DIAGNOSIS — L299 Pruritus, unspecified: Secondary | ICD-10-CM | POA: Diagnosis not present

## 2021-07-07 DIAGNOSIS — Y33XXXA Other specified events, undetermined intent, initial encounter: Secondary | ICD-10-CM | POA: Diagnosis not present

## 2021-07-07 DIAGNOSIS — M7989 Other specified soft tissue disorders: Secondary | ICD-10-CM | POA: Diagnosis not present

## 2021-07-07 DIAGNOSIS — M25472 Effusion, left ankle: Secondary | ICD-10-CM | POA: Diagnosis not present

## 2021-07-07 DIAGNOSIS — M25571 Pain in right ankle and joints of right foot: Secondary | ICD-10-CM | POA: Diagnosis not present

## 2021-07-07 DIAGNOSIS — R61 Generalized hyperhidrosis: Secondary | ICD-10-CM | POA: Diagnosis not present

## 2021-07-07 DIAGNOSIS — S99911A Unspecified injury of right ankle, initial encounter: Secondary | ICD-10-CM | POA: Diagnosis not present

## 2021-07-07 DIAGNOSIS — Z743 Need for continuous supervision: Secondary | ICD-10-CM | POA: Diagnosis not present

## 2021-07-07 DIAGNOSIS — M25471 Effusion, right ankle: Secondary | ICD-10-CM | POA: Diagnosis not present

## 2021-07-07 DIAGNOSIS — Z87891 Personal history of nicotine dependence: Secondary | ICD-10-CM | POA: Diagnosis not present

## 2021-07-07 DIAGNOSIS — J4541 Moderate persistent asthma with (acute) exacerbation: Secondary | ICD-10-CM | POA: Diagnosis not present

## 2021-07-07 DIAGNOSIS — Z77120 Contact with and (suspected) exposure to mold (toxic): Secondary | ICD-10-CM | POA: Diagnosis not present

## 2021-07-07 DIAGNOSIS — R262 Difficulty in walking, not elsewhere classified: Secondary | ICD-10-CM | POA: Diagnosis not present

## 2021-07-09 DIAGNOSIS — S93401A Sprain of unspecified ligament of right ankle, initial encounter: Secondary | ICD-10-CM | POA: Diagnosis not present

## 2021-07-13 ENCOUNTER — Ambulatory Visit: Payer: Medicare Other | Admitting: Occupational Therapy

## 2021-07-14 DIAGNOSIS — J329 Chronic sinusitis, unspecified: Secondary | ICD-10-CM | POA: Diagnosis not present

## 2021-07-14 DIAGNOSIS — H6690 Otitis media, unspecified, unspecified ear: Secondary | ICD-10-CM | POA: Diagnosis not present

## 2021-07-14 DIAGNOSIS — Z881 Allergy status to other antibiotic agents status: Secondary | ICD-10-CM | POA: Diagnosis not present

## 2021-07-14 DIAGNOSIS — R109 Unspecified abdominal pain: Secondary | ICD-10-CM | POA: Diagnosis not present

## 2021-07-14 DIAGNOSIS — R0602 Shortness of breath: Secondary | ICD-10-CM | POA: Diagnosis not present

## 2021-07-14 DIAGNOSIS — Z87891 Personal history of nicotine dependence: Secondary | ICD-10-CM | POA: Diagnosis not present

## 2021-07-14 DIAGNOSIS — Z743 Need for continuous supervision: Secondary | ICD-10-CM | POA: Diagnosis not present

## 2021-07-14 DIAGNOSIS — J40 Bronchitis, not specified as acute or chronic: Secondary | ICD-10-CM | POA: Diagnosis not present

## 2021-07-14 DIAGNOSIS — Z880 Allergy status to penicillin: Secondary | ICD-10-CM | POA: Diagnosis not present

## 2021-07-14 DIAGNOSIS — Z882 Allergy status to sulfonamides status: Secondary | ICD-10-CM | POA: Diagnosis not present

## 2021-07-14 DIAGNOSIS — T699XXA Effect of reduced temperature, unspecified, initial encounter: Secondary | ICD-10-CM | POA: Diagnosis not present

## 2021-07-14 DIAGNOSIS — Z885 Allergy status to narcotic agent status: Secondary | ICD-10-CM | POA: Diagnosis not present

## 2021-07-14 DIAGNOSIS — R6889 Other general symptoms and signs: Secondary | ICD-10-CM | POA: Diagnosis not present

## 2021-07-14 DIAGNOSIS — E161 Other hypoglycemia: Secondary | ICD-10-CM | POA: Diagnosis not present

## 2021-07-14 DIAGNOSIS — E039 Hypothyroidism, unspecified: Secondary | ICD-10-CM | POA: Diagnosis not present

## 2021-07-14 DIAGNOSIS — E079 Disorder of thyroid, unspecified: Secondary | ICD-10-CM | POA: Diagnosis not present

## 2021-07-14 DIAGNOSIS — R197 Diarrhea, unspecified: Secondary | ICD-10-CM | POA: Diagnosis not present

## 2021-07-14 DIAGNOSIS — J309 Allergic rhinitis, unspecified: Secondary | ICD-10-CM | POA: Diagnosis not present

## 2021-07-14 DIAGNOSIS — E162 Hypoglycemia, unspecified: Secondary | ICD-10-CM | POA: Diagnosis not present

## 2021-07-14 DIAGNOSIS — Z77098 Contact with and (suspected) exposure to other hazardous, chiefly nonmedicinal, chemicals: Secondary | ICD-10-CM | POA: Diagnosis not present

## 2021-07-14 DIAGNOSIS — I10 Essential (primary) hypertension: Secondary | ICD-10-CM | POA: Diagnosis not present

## 2021-07-15 DIAGNOSIS — R001 Bradycardia, unspecified: Secondary | ICD-10-CM | POA: Diagnosis not present

## 2021-07-15 DIAGNOSIS — R0602 Shortness of breath: Secondary | ICD-10-CM | POA: Diagnosis not present

## 2021-07-18 ENCOUNTER — Ambulatory Visit: Payer: Medicare Other | Attending: Family Medicine | Admitting: Occupational Therapy

## 2021-07-18 ENCOUNTER — Encounter: Payer: Self-pay | Admitting: Occupational Therapy

## 2021-07-18 DIAGNOSIS — M25531 Pain in right wrist: Secondary | ICD-10-CM

## 2021-07-18 DIAGNOSIS — M6281 Muscle weakness (generalized): Secondary | ICD-10-CM | POA: Diagnosis not present

## 2021-07-18 DIAGNOSIS — M79642 Pain in left hand: Secondary | ICD-10-CM

## 2021-07-18 DIAGNOSIS — M79641 Pain in right hand: Secondary | ICD-10-CM | POA: Insufficient documentation

## 2021-07-18 DIAGNOSIS — M25631 Stiffness of right wrist, not elsewhere classified: Secondary | ICD-10-CM

## 2021-07-18 DIAGNOSIS — M25641 Stiffness of right hand, not elsewhere classified: Secondary | ICD-10-CM

## 2021-07-18 NOTE — Therapy (Unsigned)
[facility] [address], [place], [address] Phone: [phone]   Fax:  [phone]  Occupational Therapy Evaluation  Patient Details  Name: Angela [name] MRN: [id number] Date of Birth: 1950/06/29 Referring Provider (OT): Kristen Cardinal   Encounter Date: 07/18/2021   OT End of Session - 07/18/21 1514     Visit Number 1    Number of Visits 12    Date for OT Re-Evaluation 09/12/21    OT Start Time 1400    OT Stop Time 1501    OT Time Calculation (min) 61 min    Activity Tolerance Patient tolerated treatment well    Behavior During Therapy [facility] for tasks assessed/performed             Past Medical History:  Diagnosis Date   Agitation 09/18/2017   Allergic rhinitis    Altered mental status, unspecified 12/25/2018   Anemia    Blurred vision    Chest pain of uncertain etiology 5/42/7062   Confusion state 12/25/2018   COPD (chronic obstructive pulmonary disease) ([place])    Depression    Diverticulosis    Endometriosis    Falls    GERD (gastroesophageal reflux disease)    Hernia, hiatal    Hypothyroidism    IBS (irritable bowel syndrome)    Labile mood    Malignant neoplasm of skin    Malnutrition of mild degree ([place]) 08/08/2018   Migraine    Neuropathy    Noncompliance 12/25/2017   PTSD (post-traumatic stress disorder)    SIADH (syndrome of inappropriate ADH production) ([place]) 09/08/2016   Thyroid disease     Past Surgical History:  Procedure Laterality Date   APPENDECTOMY     CHOLECYSTECTOMY     CHOLECYSTECTOMY, LAPAROSCOPIC     ESOPHAGOGASTRODUODENOSCOPY (EGD) WITH PROPOFOL N/A 03/29/2015   Procedure: ESOPHAGOGASTRODUODENOSCOPY (EGD) WITH PROPOFOL;  Surgeon: Josefine Class, MD;  Location: [facility] ENDOSCOPY;  Service: Endoscopy;  Laterality: N/A;   HERNIA REPAIR     laproscopy     TONSILLECTOMY     uteral suspension      There were no vitals filed for this visit.   Subjective  Assessment - 07/18/21 1506     Subjective  I fell New Year's and then was in the hospital last night COVID pneumonia.  I broke my pinky.  I was in a cast and then I just took me out of my cast and told me to start using my hand.  My right hand really hurts when I grip anything or use a pen or cut food.  In the left hand also hurts in the palm in the joints.    Pertinent History Patient was seen years ago by this OT for bilateral carpal tunnel symptoms.  As well as hand pain.  Patient returned this date with reports of fracture to right fifth digit.  Unable to make a fist pain or dorsal hand as well as hand and volar forearm or wrist.  10/10 pain reported tenderness as well as swelling.  Patient referred to OT for bilateral hand pain.    Patient Stated Goals I want the pain better my right hand so that I can use it as my hand that I need to cut with, grip with, Write with, use my walker and bathe and dress    Currently in Pain? Yes    Pain Score 10-Worst pain ever    Pain Location Hand   wrist  Pain Orientation Right    Pain Descriptors / Indicators Aching;Tender;Tightness;Throbbing;Sore    Pain Type Acute pain    Pain Onset More than a month ago    Pain Frequency Constant               [facility] OT Assessment - 07/18/21 0001       Assessment   Medical Diagnosis Bilateral hand pain/R fifth digit fracture    Referring Provider (OT) Kristen Cardinal    Onset Date/Surgical Date 01/16/21    Hand Dominance Right    Prior Therapy 2020      Home  Environment   Lives With Alone      Prior Function   Vocation On disability    Leisure R hand dominant , reading, playing guitar, piano, photography , travel      AROM   Right Wrist Extension 52 Degrees    Right Wrist Flexion 70 Degrees    Right Wrist Radial Deviation 22 Degrees    Right Wrist Ulnar Deviation 30 Degrees      Right Hand AROM   R Thumb Radial ABduction/ADduction 0-55 50    R Thumb Palmar ABduction/ADduction 0-45 50    R Thumb  Opposition to Index --   Opposition limited to 1 cm from 4th and 5th digit   R Index  MCP 0-90 80 Degrees    R Index PIP 0-100 90 Degrees    R Long  MCP 0-90 80 Degrees    R Long PIP 0-100 90 Degrees    R Ring  MCP 0-90 80 Degrees    R Ring PIP 0-100 85 Degrees    R Little  MCP 0-90 80 Degrees    R Little PIP 0-100 80 Degrees                      OT Treatments/Exercises (OP) - 07/18/21 0001       LUE Paraffin   Number Minutes Paraffin 8 Minutes    LUE Paraffin Location Hand    Comments Decreased pain      RUE Contrast Bath   Time 8 minutes    Comments Decreased swelling and pain prior to review of home exercises              Patient to do contrast prior to home program to 3 times a day Further with Isotoner compression glove to wear mostly at nighttime but also during the day off for ADLs and home programs   Perform soft tissue massage with metacarpal spreads in webspace to right hand and light rolling of palm over read roller for soft tissue massage to volar flexors   do prior to gentle pain-free active range of motion for metacarpal flexion and extension 10 reps Intrinsic a fist block with passive range of motion for extension. In composite fist to read roller with passive range of motion and digit extension each 10 reps      Provided her with built-up handles to enlarged grips on Stylo as well as pen and utensils. Pain with enlarged grips    OT Education - 07/18/21 1514     Education provided Yes    Education Details Findings of evaluation and home program    Person(s) Educated Patient    Methods Explanation;Demonstration;Verbal cues;Handout    Comprehension Verbalized understanding;Returned demonstration              OT Short Term Goals - 03/12/18 1850       OT  SHORT TERM GOAL #1   Title Pain on PRHWE improve by at least 15 points     Baseline pain at [facility] on [facility] 39/50    Time 3    Period Weeks    Status New    Target Date 04/02/18                OT Long Term Goals - 07/18/21 1728       OT LONG TERM GOAL #1   Title Patient to be independent in the home program to decrease pain and edema in the right hand to be able to improve flexion extension of digits.    Baseline Patient with increased edema and pain 10/10 in right hand with severe stiffness and digit flexion and extension.  As well as wrist decreased flexion extension    Time 3    Period Weeks    Status New    Target Date 08/08/21      OT LONG TERM GOAL #2   Title Right digit extension improved to within normal limits for patient to don glove and put hand in pocket with ease.    Baseline Right digit extension -15 to -25 at [facility] with [facility]xtension [facility] some pain over dorsal digits    Time 3    Period Weeks    Status New    Target Date 08/08/21      OT LONG TERM GOAL #3   Title Right digits flexion improved for patient to be able to make a composite fist touching palm with pain less than a 2/10 to use walker    Baseline Pain 10/10 in right hand with active range of motion.  MC's 80 degrees, PIPs 80-90.    Time 6    Period Weeks    Status New    Target Date 08/29/21      OT LONG TERM GOAL #4   Title Right wrist extension and flexion improve within normal limits to push and pull door open use walker and wipe table    Baseline Right wrist extension 52 , flexion 70 with pain over dorsal wrist 10/10    Time 7    Period Weeks    Status New    Target Date 09/05/21      OT LONG TERM GOAL #5   Title Bilateral grip strength improved within functional limits for patient's age to be able to grip utensils and perform ADLs with pain less than 2/    Baseline Not tested patient with limited range of motion flexion extension, increase edema and stiffness and pain 10/10 in the right hand, left hand 4-6/10 pain in palm and PIPs    Time 8    Period Weeks    Status New    Target Date 09/12/21                   Plan - 07/18/21 1724     Clinical  Impression Statement Patient presented OT evaluation with a diagnosis of bilateral hand pain.  Patient reports she only had falls the last 6 months.  Add many falls last year per patient.  Patient reports she had a fracture on the right ulnar side of hand New Year's.  Was in a cast and then removed and allowed to continue with range of motion and functional use.  Patient arrived with increased edema and pain in right hand with decreased extension and flexion of digits in the right hand with fifth and fourth the  worse.  Pain and tenderness 10/10.  As well as decreased wrist flexion extension with increased pain over dorsal wrist.  Left hand patient report increased discomfort and pain 4/10 in the palm with opening hand as well as PIP joints 6/10 pain.  Grip and prehension strength was not assessed because of pain and swelling.  Patient limited in functional use of bilateral hand with the right hand the worst and her dominant hand perform ADLs and IADLs.  Patient can benefit from skilled OT services.    OT Occupational Profile and History Problem Focused Assessment - Including review of records relating to presenting problem    Occupational performance deficits (Please refer to evaluation for details): ADL's;IADL's;Rest and Sleep;Leisure;Play;Social Participation    Body Structure / Function / Physical Skills ADL;Strength;Pain;Edema;UE functional use;IADL;ROM;Flexibility;Decreased knowledge of precautions    Rehab Potential Fair    Clinical Decision Making Several treatment options, min-mod task modification necessary    Comorbidities Affecting Occupational Performance: None    Modification or Assistance to Complete Evaluation  No modification of tasks or assist necessary to complete eval    OT Frequency 2x / week    OT Duration 8 weeks    OT Treatment/Interventions Self-care/ADL training;Ultrasound;Contrast Bath;DME and/or AE instruction;Splinting;Manual Therapy;Therapeutic activities;Patient/family  education    Consulted and Agree with Plan of Care Patient             Patient will benefit from skilled therapeutic intervention in order to improve the following deficits and impairments:   Body Structure / Function / Physical Skills: ADL, Strength, Pain, Edema, UE functional use, IADL, ROM, Flexibility, Decreased knowledge of precautions       Visit Diagnosis: Pain in both hands  Stiffness of right hand, not elsewhere classified  Stiffness of right wrist, not elsewhere classified  Pain in right wrist  Muscle weakness (generalized)    Problem List Patient Active Problem List   Diagnosis Date Noted   Mixed hyperlipidemia 07/02/2019   Hypothyroidism due to acquired atrophy of thyroid 04/29/2019   Osteopenia determined by x-ray 04/29/2019   Acquired claw toe of both feet 10/23/2018   Dermatitis 10/22/2018   Mixed stress and urge urinary incontinence 08/08/2018   Magnesium deficiency 08/08/2018   Aortic atherosclerosis ([place]) 05/31/2018   Achilles tendon disorder 04/25/2018   Insomnia 95/62/1308   Diastolic dysfunction 65/78/4696   Mitral regurgitation 03/29/2018   Personality disorder ([place]) 12/25/2017   Arthritis of hand, degenerative 12/07/2017   Bipolar I disorder, most recent episode mixed, severe with psychotic features ([place]) 09/18/2017   Hallux valgus, acquired 07/05/2017   Hammer toes of both feet 07/05/2017   Unstable ankle 07/05/2017   Chronic bilateral low back pain without sciatica 06/09/2017   Tinea unguium 05/08/2017   Conductive hearing loss of right ear with unrestricted hearing of left ear 04/03/2017   Adjustment disorder with depressed mood 01/18/2017   Migraine 01/18/2017   Irritable bowel syndrome with constipation 11/17/2016   Bilateral carpal tunnel syndrome 11/17/2016   Plantar fasciitis, bilateral 11/17/2016   Cervical myofascial pain syndrome 11/17/2016   Chronic hyponatremia 11/17/2016   PTSD (post-traumatic stress disorder) 11/17/2016    Hiatal hernia 11/17/2016   Vitamin D deficiency 11/17/2016   Vitamin B12 deficiency 11/17/2016   Cervical spondylosis with myelopathy 05/24/2016   MCI (mild cognitive impairment) with memory loss 03/30/2016   Gait abnormality 03/30/2016   Chronic bipolar affective disorder ([place]) 01/24/2016   Recurrent major depressive disorder, in partial remission ([place]) 01/24/2016   Anemia, iron deficiency 06/24/2015  Benign essential HTN 06/24/2015   History of falling 05/22/2015   History of TIA (transient ischemic attack) 04/20/2015   Gastroesophageal reflux disease with hiatal hernia 04/20/2015   Cervical disc disorder at C5-C6 level with radiculopathy 03/25/2015   Chronic neck pain 03/25/2015   Pelvic pain in female 10/30/2014   History of skin cancer 06/30/2014   Chronic headaches 12/16/2013   Neuropathy 12/16/2013    Rosalyn Gess, OTR/L,CLT 07/18/2021, 5:35 PM  [facility] [address] [place], [address] Phone: [phone]   Fax:  [phone]  Name: Tymia Streb MRN: [id number] Date of Birth: 08-30-1950

## 2021-07-22 DIAGNOSIS — J4541 Moderate persistent asthma with (acute) exacerbation: Secondary | ICD-10-CM | POA: Diagnosis not present

## 2021-07-22 DIAGNOSIS — R262 Difficulty in walking, not elsewhere classified: Secondary | ICD-10-CM | POA: Diagnosis not present

## 2021-07-22 DIAGNOSIS — E039 Hypothyroidism, unspecified: Secondary | ICD-10-CM | POA: Diagnosis not present

## 2021-07-22 DIAGNOSIS — K1379 Other lesions of oral mucosa: Secondary | ICD-10-CM | POA: Diagnosis not present

## 2021-07-22 DIAGNOSIS — Z789 Other specified health status: Secondary | ICD-10-CM | POA: Diagnosis not present

## 2021-07-22 DIAGNOSIS — I10 Essential (primary) hypertension: Secondary | ICD-10-CM | POA: Diagnosis not present

## 2021-07-26 ENCOUNTER — Ambulatory Visit: Payer: Medicare Other | Admitting: Occupational Therapy

## 2021-07-26 DIAGNOSIS — M25531 Pain in right wrist: Secondary | ICD-10-CM | POA: Diagnosis not present

## 2021-07-26 DIAGNOSIS — M79642 Pain in left hand: Secondary | ICD-10-CM

## 2021-07-26 DIAGNOSIS — M79641 Pain in right hand: Secondary | ICD-10-CM | POA: Diagnosis not present

## 2021-07-26 DIAGNOSIS — M25631 Stiffness of right wrist, not elsewhere classified: Secondary | ICD-10-CM

## 2021-07-26 DIAGNOSIS — M25641 Stiffness of right hand, not elsewhere classified: Secondary | ICD-10-CM | POA: Diagnosis not present

## 2021-07-26 DIAGNOSIS — M6281 Muscle weakness (generalized): Secondary | ICD-10-CM | POA: Diagnosis not present

## 2021-07-26 NOTE — Therapy (Signed)
[facility] [address], [place], [address] Phone: [phone]   Fax:  [phone]  Occupational Therapy Treatment  Patient Details  Name: Angela [name] MRN: [id number] Date of Birth: 12/24/1950 Referring Provider (OT): Kristen Cardinal   Encounter Date: 07/26/2021   OT End of Session - 07/26/21 1850     Visit Number 2    Number of Visits 12    Date for OT Re-Evaluation 09/12/21    OT Start Time 1458    OT Stop Time 1533    OT Time Calculation (min) 35 min    Activity Tolerance Patient tolerated treatment well    Behavior During Therapy [facility] for tasks assessed/performed             Past Medical History:  Diagnosis Date   Agitation 09/18/2017   Allergic rhinitis    Altered mental status, unspecified 12/25/2018   Anemia    Blurred vision    Chest pain of uncertain etiology 1/49/7026   Confusion state 12/25/2018   COPD (chronic obstructive pulmonary disease) ([place])    Depression    Diverticulosis    Endometriosis    Falls    GERD (gastroesophageal reflux disease)    Hernia, hiatal    Hypothyroidism    IBS (irritable bowel syndrome)    Labile mood    Malignant neoplasm of skin    Malnutrition of mild degree ([place]) 08/08/2018   Migraine    Neuropathy    Noncompliance 12/25/2017   PTSD (post-traumatic stress disorder)    SIADH (syndrome of inappropriate ADH production) ([place]) 09/08/2016   Thyroid disease     Past Surgical History:  Procedure Laterality Date   APPENDECTOMY     CHOLECYSTECTOMY     CHOLECYSTECTOMY, LAPAROSCOPIC     ESOPHAGOGASTRODUODENOSCOPY (EGD) WITH PROPOFOL N/A 03/29/2015   Procedure: ESOPHAGOGASTRODUODENOSCOPY (EGD) WITH PROPOFOL;  Surgeon: Josefine Class, MD;  Location: [facility] ENDOSCOPY;  Service: Endoscopy;  Laterality: N/A;   HERNIA REPAIR     laproscopy     TONSILLECTOMY     uteral suspension      There were no vitals filed for this visit.   Subjective  Assessment - 07/26/21 1848     Subjective  I do not have to tell you tell any buckets to do hot and cold.  I was at [facility] and a shop on [address] seen a TENS unit as well as a heating pad for the back to the right leg we can use my hands.  I did some screenshots to show you to see what you think    Pertinent History Patient was seen years ago by this OT for bilateral carpal tunnel symptoms.  As well as hand pain.  Patient returned this date with reports of fracture to right fifth digit.  Unable to make a fist pain or dorsal hand as well as hand and volar forearm or wrist.  10/10 pain reported tenderness as well as swelling.  Patient referred to OT for bilateral hand pain.    Patient Stated Goals I want the pain better my right hand so that I can use it as my hand that I need to cut with, grip with, Write with, use my walker and bathe and dress    Currently in Pain? Yes    Pain Score --   No number provided today  OT Treatments/Exercises (OP) - 07/26/21 0001       RUE Fluidotherapy   Number Minutes Fluidotherapy 10 Minutes    RUE Fluidotherapy Location Hand;Wrist    Comments Decreased pain and stiffness done 2 rotations of 1 minute Feiss      LUE Fluidotherapy   Number Minutes Fluidotherapy 10 Minutes    LUE Fluidotherapy Location Hand;Wrist    Comments Decreased pain and stiffness done ice 2 rotations of 1 minute              Patient reports she is in a hotel living at the moment and do not have access to be able to do contrast. Asked patient to be able to use a heating pad or cold pack if she cannot get access to it. After Feudo with contrast done soft tissue massage using [place] #2 2 for sweeping over right and left wrist and forearm as well as brushing techniques and palm. Done mini massager over volar digits focusing mostly on right hand with fourth volar PIP mostly tender more than fifth. Tendon glides patient able to do MC flexion as  well as PIP and intrinsic a fist with second or third.  But very painful 10/10 with flexion at fourth PIP and 4/10 at fifth. PIP extension improved after Fluido in contrast to close to 0 degrees. Reinforced with patient again with left hand patient to hold off on tight and prolonged grips loosen up on her grip when using cylinder objects and then enlarged grips on the right hand to improve pain in fourth and fifth digits.          OT Education - 07/26/21 1849     Education provided Yes    Education Details Home program reviewed and progress    Person(s) Educated Patient    Methods Explanation;Demonstration;Verbal cues;Handout    Comprehension Verbalized understanding;Returned demonstration              OT Short Term Goals - 03/12/18 1850       OT SHORT TERM GOAL #1   Title Pain on PRHWE improve by at least 15 points     Baseline pain at [facility] on [facility] 39/50    Time 3    Period Weeks    Status New    Target Date 04/02/18               OT Long Term Goals - 07/18/21 1728       OT LONG TERM GOAL #1   Title Patient to be independent in the home program to decrease pain and edema in the right hand to be able to improve flexion extension of digits.    Baseline Patient with increased edema and pain 10/10 in right hand with severe stiffness and digit flexion and extension.  As well as wrist decreased flexion extension    Time 3    Period Weeks    Status New    Target Date 08/08/21      OT LONG TERM GOAL #2   Title Right digit extension improved to within normal limits for patient to don glove and put hand in pocket with ease.    Baseline Right digit extension -15 to -25 at [facility] with hyperextension at the [facility] some pain over dorsal digits    Time 3    Period Weeks    Status New    Target Date 08/08/21      OT LONG TERM GOAL #3   Title Right digits flexion improved  for patient to be able to make a composite fist touching palm with pain less than a 2/10 to use walker     Baseline Pain 10/10 in right hand with active range of motion.  MC's 80 degrees, PIPs 80-90.    Time 6    Period Weeks    Status New    Target Date 08/29/21      OT LONG TERM GOAL #4   Title Right wrist extension and flexion improve within normal limits to push and pull door open use walker and wipe table    Baseline Right wrist extension 52 , flexion 70 with pain over dorsal wrist 10/10    Time 7    Period Weeks    Status New    Target Date 09/05/21      OT LONG TERM GOAL #5   Title Bilateral grip strength improved within functional limits for patient's age to be able to grip utensils and perform ADLs with pain less than 2/    Baseline Not tested patient with limited range of motion flexion extension, increase edema and stiffness and pain 10/10 in the right hand, left hand 4-6/10 pain in palm and PIPs    Time 8    Period Weeks    Status New    Target Date 09/12/21                   Plan - 07/26/21 1850     Clinical Impression Statement Patient presented OT evaluation with a diagnosis of bilateral hand pain.  Patient reports she some  had falls with  the last 6 months. Had  many falls last year per patient.  Patient reports she had a fracture on the right ulnar side of hand New Year's.  Was in a cast and then removed and allowed to continue with range of motion and functional use.  Patient arrived with increased edema and pain in right hand with decreased extension and flexion of digits in the right hand with fifth and fourth the worse.  Pain and tenderness 10/10.  As well as decreased wrist flexion extension with increased pain over dorsal wrist.  Left hand patient report increased discomfort and pain 4/10 in the palm with opening hand as well as PIP joints 6/10 pain.  Patient arrived this date late last some information on TENS units, heating pads e-stim units.  Patient had increased range of motion with less pain after the scooter with contrast.  Patient mostly tender over  volar fourth PIP with 10/10 and fifth PIP 4/10.  Reinforced with patient to not do tight and forceful grips but loose grips when holding onto a cylinder objects.  Did show increase PIP extension on the right hand with most limitation in flexion of fourth and fifth PIP.  Patient limited in functional use of bilateral hand with the right hand the worst and her dominant hand perform ADLs and IADLs.  Patient can benefit from skilled OT services.    OT Occupational Profile and History Problem Focused Assessment - Including review of records relating to presenting problem    Occupational performance deficits (Please refer to evaluation for details): ADL's;IADL's;Rest and Sleep;Leisure;Play;Social Participation    Body Structure / Function / Physical Skills ADL;Strength;Pain;Edema;UE functional use;IADL;ROM;Flexibility;Decreased knowledge of precautions    Rehab Potential Fair    Clinical Decision Making Several treatment options, min-mod task modification necessary    Comorbidities Affecting Occupational Performance: None    Modification or Assistance to Complete Evaluation  No modification  of tasks or assist necessary to complete eval    OT Frequency 2x / week    OT Duration 8 weeks    OT Treatment/Interventions Self-care/ADL training;Ultrasound;Contrast Bath;DME and/or AE instruction;Splinting;Manual Therapy;Therapeutic activities;Patient/family education    Consulted and Agree with Plan of Care Patient             Patient will benefit from skilled therapeutic intervention in order to improve the following deficits and impairments:   Body Structure / Function / Physical Skills: ADL, Strength, Pain, Edema, UE functional use, IADL, ROM, Flexibility, Decreased knowledge of precautions       Visit Diagnosis: Pain in both hands  Stiffness of right hand, not elsewhere classified  Stiffness of right wrist, not elsewhere classified  Pain in right wrist  Muscle weakness  (generalized)    Problem List Patient Active Problem List   Diagnosis Date Noted   Mixed hyperlipidemia 07/02/2019   Hypothyroidism due to acquired atrophy of thyroid 04/29/2019   Osteopenia determined by x-ray 04/29/2019   Acquired claw toe of both feet 10/23/2018   Dermatitis 10/22/2018   Mixed stress and urge urinary incontinence 08/08/2018   Magnesium deficiency 08/08/2018   Aortic atherosclerosis ([place]) 05/31/2018   Achilles tendon disorder 04/25/2018   Insomnia 50/56/9794   Diastolic dysfunction 80/16/5537   Mitral regurgitation 03/29/2018   Personality disorder ([place]) 12/25/2017   Arthritis of hand, degenerative 12/07/2017   Bipolar I disorder, most recent episode mixed, severe with psychotic features ([place]) 09/18/2017   Hallux valgus, acquired 07/05/2017   Hammer toes of both feet 07/05/2017   Unstable ankle 07/05/2017   Chronic bilateral low back pain without sciatica 06/09/2017   Tinea unguium 05/08/2017   Conductive hearing loss of right ear with unrestricted hearing of left ear 04/03/2017   Adjustment disorder with depressed mood 01/18/2017   Migraine 01/18/2017   Irritable bowel syndrome with constipation 11/17/2016   Bilateral carpal tunnel syndrome 11/17/2016   Plantar fasciitis, bilateral 11/17/2016   Cervical myofascial pain syndrome 11/17/2016   Chronic hyponatremia 11/17/2016   PTSD (post-traumatic stress disorder) 11/17/2016   Hiatal hernia 11/17/2016   Vitamin D deficiency 11/17/2016   Vitamin B12 deficiency 11/17/2016   Cervical spondylosis with myelopathy 05/24/2016   MCI (mild cognitive impairment) with memory loss 03/30/2016   Gait abnormality 03/30/2016   Chronic bipolar affective disorder ([place]) 01/24/2016   Recurrent major depressive disorder, in partial remission ([place]) 01/24/2016   Anemia, iron deficiency 06/24/2015   Benign essential HTN 06/24/2015   History of falling 05/22/2015   History of TIA (transient ischemic attack) 04/20/2015    Gastroesophageal reflux disease with hiatal hernia 04/20/2015   Cervical disc disorder at C5-C6 level with radiculopathy 03/25/2015   Chronic neck pain 03/25/2015   Pelvic pain in female 10/30/2014   History of skin cancer 06/30/2014   Chronic headaches 12/16/2013   Neuropathy 12/16/2013    Rosalyn Gess, OTR/L,CLT 07/26/2021, 6:54 PM  [facility] [place] [facility] [address], [place], [address] Phone: [phone]   Fax:  [phone]  Name: Angela [name] MRN: [id number] Date of Birth: 1950/07/11

## 2021-07-28 ENCOUNTER — Ambulatory Visit: Payer: Medicare Other | Admitting: Occupational Therapy

## 2021-08-02 DIAGNOSIS — J4541 Moderate persistent asthma with (acute) exacerbation: Secondary | ICD-10-CM | POA: Diagnosis not present

## 2021-08-02 DIAGNOSIS — Z789 Other specified health status: Secondary | ICD-10-CM | POA: Diagnosis not present

## 2021-08-02 DIAGNOSIS — G479 Sleep disorder, unspecified: Secondary | ICD-10-CM | POA: Diagnosis not present

## 2021-08-02 DIAGNOSIS — K1379 Other lesions of oral mucosa: Secondary | ICD-10-CM | POA: Diagnosis not present

## 2021-08-02 DIAGNOSIS — I10 Essential (primary) hypertension: Secondary | ICD-10-CM | POA: Diagnosis not present

## 2021-08-02 DIAGNOSIS — E039 Hypothyroidism, unspecified: Secondary | ICD-10-CM | POA: Diagnosis not present

## 2021-08-02 DIAGNOSIS — R262 Difficulty in walking, not elsewhere classified: Secondary | ICD-10-CM | POA: Diagnosis not present

## 2021-08-04 ENCOUNTER — Ambulatory Visit: Payer: Medicare Other | Admitting: Occupational Therapy

## 2021-08-04 DIAGNOSIS — M25631 Stiffness of right wrist, not elsewhere classified: Secondary | ICD-10-CM

## 2021-08-04 DIAGNOSIS — M25531 Pain in right wrist: Secondary | ICD-10-CM | POA: Diagnosis not present

## 2021-08-04 DIAGNOSIS — M79641 Pain in right hand: Secondary | ICD-10-CM

## 2021-08-04 DIAGNOSIS — M79642 Pain in left hand: Secondary | ICD-10-CM | POA: Diagnosis not present

## 2021-08-04 DIAGNOSIS — M25641 Stiffness of right hand, not elsewhere classified: Secondary | ICD-10-CM

## 2021-08-04 DIAGNOSIS — M6281 Muscle weakness (generalized): Secondary | ICD-10-CM

## 2021-08-04 NOTE — Therapy (Signed)
[facility] [address] [place], [place], [address] Phone: [phone]   Fax:  [phone]  Occupational Therapy Treatment  Patient Details  Name: Angela [name] MRN: [id number] Date of Birth: 1950/09/23 Referring Provider (OT): Kristen Cardinal   Encounter Date: 08/04/2021   OT End of Session - 08/04/21 1613     Visit Number 3    Number of Visits 12    Date for OT Re-Evaluation 09/12/21    OT Start Time 4765    OT Stop Time 1700    OT Time Calculation (min) 47 min    Activity Tolerance Patient tolerated treatment well    Behavior During Therapy [facility] for tasks assessed/performed             Past Medical History:  Diagnosis Date   Agitation 09/18/2017   Allergic rhinitis    Altered mental status, unspecified 12/25/2018   Anemia    Blurred vision    Chest pain of uncertain etiology 4/65/0354   Confusion state 12/25/2018   COPD (chronic obstructive pulmonary disease) ([place])    Depression    Diverticulosis    Endometriosis    Falls    GERD (gastroesophageal reflux disease)    Hernia, hiatal    Hypothyroidism    IBS (irritable bowel syndrome)    Labile mood    Malignant neoplasm of skin    Malnutrition of mild degree ([place]) 08/08/2018   Migraine    Neuropathy    Noncompliance 12/25/2017   PTSD (post-traumatic stress disorder)    SIADH (syndrome of inappropriate ADH production) ([place]) 09/08/2016   Thyroid disease     Past Surgical History:  Procedure Laterality Date   APPENDECTOMY     CHOLECYSTECTOMY     CHOLECYSTECTOMY, LAPAROSCOPIC     ESOPHAGOGASTRODUODENOSCOPY (EGD) WITH PROPOFOL N/A 03/29/2015   Procedure: ESOPHAGOGASTRODUODENOSCOPY (EGD) WITH PROPOFOL;  Surgeon: Josefine Class, MD;  Location: [facility] ENDOSCOPY;  Service: Endoscopy;  Laterality: N/A;   HERNIA REPAIR     laproscopy     TONSILLECTOMY     uteral suspension      There were no vitals filed for this visit.   Subjective  Assessment - 08/04/21 1613     Subjective  Look my hand is little bit better making a fist.  Still hard to write with the pinky on the table.  The regular phone you gave him a pin was good    Pertinent History Patient was seen years ago by this OT for bilateral carpal tunnel symptoms.  As well as hand pain.  Patient returned this date with reports of fracture to right fifth digit.  Unable to make a fist pain or dorsal hand as well as hand and volar forearm or wrist.  10/10 pain reported tenderness as well as swelling.  Patient referred to OT for bilateral hand pain.    Patient Stated Goals I want the pain better my right hand so that I can use it as my hand that I need to cut with, grip with, Write with, use my walker and bathe and dress    Currently in Pain? Yes    Pain Score --   No value               [facility] OT Assessment - 08/04/21 0001       Right Hand AROM   R Index  MCP 0-90 90 Degrees    R Index PIP 0-100 100 Degrees  R Long  MCP 0-90 90 Degrees    R Long PIP 0-100 95 Degrees    R Ring  MCP 0-90 90 Degrees    R Ring PIP 0-100 90 Degrees    R Little  MCP 0-90 90 Degrees    R Little PIP 0-100 85 Degrees                      OT Treatments/Exercises (OP) - 08/04/21 0001       RUE Paraffin   Number Minutes Paraffin --    RUE Paraffin Location --    Comments --      LUE Paraffin   Number Minutes Paraffin --    LUE Paraffin Location --    Comments --      RUE Contrast Bath   Time 8 minutes    Comments Prior to soft tissue and range of motion      LUE Contrast Bath   Time 8 minutes    Comments Prior to soft tissue range of motion            Patient reports she is in a hotel living at the moment and do not have access to be able to do contrast. Asked patient to be able to use a heating pad or cold pack if she cannot get access to it. Patient report did not do any heat or contrast but focus a lot on soft tissue massage like OT showed her last  time.  After contrast done soft tissue massage using [place]aston #2 and massage tone; for sweeping over right and left wrist and forearm as well as brushing techniques and palm. Done mini massager over volar digits focusing mostly on right hand with on all digits  volar PIP -with great success for PIP extension.  Less tenderness this date over fourth of fifth Tendon glides patient able to do MC flexion to 90 degrees this date -still limited at PIP flexion on fourth of fifth and intrinsic a fist with second or third within normal limits.    PIP extension improved after soft tissue and contrast to close to -5 degrees for the first. Reinforced with patient again with left hand patient to hold off on tight and prolonged grips loosen up on her grip when using cylinder objects and then enlarged grips on the right hand to improve pain in fourth and fifth digits. And massaging volar digits fourth and fifth prior to range of motion        OT Education - 08/04/21 1613     Education provided Yes    Education Details Home program reviewed and progress    Person(s) Educated Patient    Methods Explanation;Demonstration;Verbal cues;Handout    Comprehension Verbalized understanding;Returned demonstration              OT Short Term Goals - 03/12/18 1850       OT SHORT TERM GOAL #1   Title Pain on PRHWE improve by at least 15 points     Baseline pain at [facility] on [facility] 39/50    Time 3    Period Weeks    Status New    Target Date 04/02/18               OT Long Term Goals - 07/18/21 1728       OT LONG TERM GOAL #1   Title Patient to be independent in the home program to decrease pain and edema in the right hand  to be able to improve flexion extension of digits.    Baseline Patient with increased edema and pain 10/10 in right hand with severe stiffness and digit flexion and extension.  As well as wrist decreased flexion extension    Time 3    Period Weeks    Status New    Target Date  08/08/21      OT LONG TERM GOAL #2   Title Right digit extension improved to within normal limits for patient to don glove and put hand in pocket with ease.    Baseline Right digit extension -15 to -25 at [facility] with hyperextension at the [facility] some pain over dorsal digits    Time 3    Period Weeks    Status New    Target Date 08/08/21      OT LONG TERM GOAL #3   Title Right digits flexion improved for patient to be able to make a composite fist touching palm with pain less than a 2/10 to use walker    Baseline Pain 10/10 in right hand with active range of motion.  MC's 80 degrees, PIPs 80-90.    Time 6    Period Weeks    Status New    Target Date 08/29/21      OT LONG TERM GOAL #4   Title Right wrist extension and flexion improve within normal limits to push and pull door open use walker and wipe table    Baseline Right wrist extension 52 , flexion 70 with pain over dorsal wrist 10/10    Time 7    Period Weeks    Status New    Target Date 09/05/21      OT LONG TERM GOAL #5   Title Bilateral grip strength improved within functional limits for patient's age to be able to grip utensils and perform ADLs with pain less than 2/    Baseline Not tested patient with limited range of motion flexion extension, increase edema and stiffness and pain 10/10 in the right hand, left hand 4-6/10 pain in palm and PIPs    Time 8    Period Weeks    Status New    Target Date 09/12/21                   Plan - 08/04/21 1613     Clinical Impression Statement Patient presented OT evaluation with a diagnosis of bilateral hand pain.  Patient reports she some  had falls with  the last 6 months. Had  many falls last year per patient.  Patient reports she had a fracture on the right ulnar side of hand New Year's.  Was in a cast and then removed and allowed to continue with range of motion and functional use.  Patient arrived with increased edema and pain in right hand with decreased extension and flexion  of digits in the right hand with fifth and fourth the worse.  Pain and tenderness 10/10.  As well as decreased wrist flexion extension with increased pain over dorsal wrist.  Left hand patient report increased discomfort and pain 4/10 in the palm with opening hand as well as PIP joints 6/10 pain.  Patient arrived this date with great improvement in [place] flexion to 90 degrees and lumbrical fist.  Most of her limitation is at fourth and fifth PIP endrange extension -10 as well as flexion and composite and intrinsic.  Patient done great after fluidotherapy last time.  Could not do this date because  of it being out of service.  Await part.  Done contrast with soft tissue massage patient's pain on volar PIP much better than last time.  Able to tolerate soft tissue much better.   Reinforced with patient to not do tight and forceful grips but loose grips when holding onto a cylinder objects.  Did show increase PIP extension on the right hand with most limitation in flexion of fourth and fifth PIP.  Patient limited in functional use of bilateral hand with the right hand the worst and her dominant hand perform ADLs and IADLs.  Patient can benefit from skilled OT services.    OT Occupational Profile and History Problem Focused Assessment - Including review of records relating to presenting problem    Occupational performance deficits (Please refer to evaluation for details): ADL's;IADL's;Rest and Sleep;Leisure;Play;Social Participation    Body Structure / Function / Physical Skills ADL;Strength;Pain;Edema;UE functional use;IADL;ROM;Flexibility;Decreased knowledge of precautions    Rehab Potential Fair    Clinical Decision Making Several treatment options, min-mod task modification necessary    Comorbidities Affecting Occupational Performance: None    Modification or Assistance to Complete Evaluation  No modification of tasks or assist necessary to complete eval    OT Frequency 2x / week    OT Duration 8 weeks    OT  Treatment/Interventions Self-care/ADL training;Ultrasound;Contrast Bath;DME and/or AE instruction;Splinting;Manual Therapy;Therapeutic activities;Patient/family education    Consulted and Agree with Plan of Care Patient             Patient will benefit from skilled therapeutic intervention in order to improve the following deficits and impairments:   Body Structure / Function / Physical Skills: ADL, Strength, Pain, Edema, UE functional use, IADL, ROM, Flexibility, Decreased knowledge of precautions       Visit Diagnosis: Pain in both hands  Stiffness of right hand, not elsewhere classified  Stiffness of right wrist, not elsewhere classified  Pain in right wrist  Muscle weakness (generalized)    Problem List Patient Active Problem List   Diagnosis Date Noted   Mixed hyperlipidemia 07/02/2019   Hypothyroidism due to acquired atrophy of thyroid 04/29/2019   Osteopenia determined by x-ray 04/29/2019   Acquired claw toe of both feet 10/23/2018   Dermatitis 10/22/2018   Mixed stress and urge urinary incontinence 08/08/2018   Magnesium deficiency 08/08/2018   Aortic atherosclerosis ([place]) 05/31/2018   Achilles tendon disorder 04/25/2018   Insomnia 57/32/2025   Diastolic dysfunction 42/70/6237   Mitral regurgitation 03/29/2018   Personality disorder ([place]) 12/25/2017   Arthritis of hand, degenerative 12/07/2017   Bipolar I disorder, most recent episode mixed, severe with psychotic features ([place]) 09/18/2017   Hallux valgus, acquired 07/05/2017   Hammer toes of both feet 07/05/2017   Unstable ankle 07/05/2017   Chronic bilateral low back pain without sciatica 06/09/2017   Tinea unguium 05/08/2017   Conductive hearing loss of right ear with unrestricted hearing of left ear 04/03/2017   Adjustment disorder with depressed mood 01/18/2017   Migraine 01/18/2017   Irritable bowel syndrome with constipation 11/17/2016   Bilateral carpal tunnel syndrome 11/17/2016   Plantar  fasciitis, bilateral 11/17/2016   Cervical myofascial pain syndrome 11/17/2016   Chronic hyponatremia 11/17/2016   PTSD (post-traumatic stress disorder) 11/17/2016   Hiatal hernia 11/17/2016   Vitamin D deficiency 11/17/2016   Vitamin B12 deficiency 11/17/2016   Cervical spondylosis with myelopathy 05/24/2016   MCI (mild cognitive impairment) with memory loss 03/30/2016   Gait abnormality 03/30/2016   Chronic bipolar affective disorder ([place]) 01/24/2016  Recurrent major depressive disorder, in partial remission ([place]) 01/24/2016   Anemia, iron deficiency 06/24/2015   Benign essential HTN 06/24/2015   History of falling 05/22/2015   History of TIA (transient ischemic attack) 04/20/2015   Gastroesophageal reflux disease with hiatal hernia 04/20/2015   Cervical disc disorder at C5-C6 level with radiculopathy 03/25/2015   Chronic neck pain 03/25/2015   Pelvic pain in female 10/30/2014   History of skin cancer 06/30/2014   Chronic headaches 12/16/2013   Neuropathy 12/16/2013    Rosalyn Gess, OTR/L,CLT 08/04/2021, 5:12 PM  [place] [facility] [address] [place], [address] Phone: [phone]   Fax:  [phone]  Name: Sydny Schnitzler MRN: [id number] Date of Birth: [birth year]

## 2021-08-08 ENCOUNTER — Ambulatory Visit: Payer: Medicare Other | Admitting: Physical Therapy

## 2021-08-08 ENCOUNTER — Telehealth: Payer: Self-pay | Admitting: Physical Therapy

## 2021-08-08 NOTE — Telephone Encounter (Signed)
Attempted to call pt to move up in schedule, but pt did not answer and VM is full.

## 2021-08-08 NOTE — Telephone Encounter (Signed)
Called pt to inquiry about absence. Pt says she was not feeling well and that she did not feel well enough to attend PT.

## 2021-08-09 ENCOUNTER — Ambulatory Visit: Payer: Medicare Other | Admitting: Occupational Therapy

## 2021-08-09 DIAGNOSIS — M79642 Pain in left hand: Secondary | ICD-10-CM | POA: Diagnosis not present

## 2021-08-09 DIAGNOSIS — M79641 Pain in right hand: Secondary | ICD-10-CM

## 2021-08-09 DIAGNOSIS — M25641 Stiffness of right hand, not elsewhere classified: Secondary | ICD-10-CM | POA: Diagnosis not present

## 2021-08-09 DIAGNOSIS — M25531 Pain in right wrist: Secondary | ICD-10-CM

## 2021-08-09 DIAGNOSIS — M6281 Muscle weakness (generalized): Secondary | ICD-10-CM

## 2021-08-09 DIAGNOSIS — M25631 Stiffness of right wrist, not elsewhere classified: Secondary | ICD-10-CM

## 2021-08-10 ENCOUNTER — Encounter: Payer: Medicare Other | Admitting: Physical Therapy

## 2021-08-10 ENCOUNTER — Ambulatory Visit: Payer: Medicare Other | Admitting: Physical Therapy

## 2021-08-10 DIAGNOSIS — M4316 Spondylolisthesis, lumbar region: Secondary | ICD-10-CM | POA: Diagnosis not present

## 2021-08-10 DIAGNOSIS — M5489 Other dorsalgia: Secondary | ICD-10-CM | POA: Diagnosis not present

## 2021-08-10 DIAGNOSIS — G5603 Carpal tunnel syndrome, bilateral upper limbs: Secondary | ICD-10-CM | POA: Diagnosis not present

## 2021-08-11 ENCOUNTER — Ambulatory Visit: Payer: Medicare Other | Admitting: Occupational Therapy

## 2021-08-11 DIAGNOSIS — M25631 Stiffness of right wrist, not elsewhere classified: Secondary | ICD-10-CM | POA: Diagnosis not present

## 2021-08-11 DIAGNOSIS — M6281 Muscle weakness (generalized): Secondary | ICD-10-CM

## 2021-08-11 DIAGNOSIS — K1379 Other lesions of oral mucosa: Secondary | ICD-10-CM | POA: Diagnosis not present

## 2021-08-11 DIAGNOSIS — M79641 Pain in right hand: Secondary | ICD-10-CM | POA: Diagnosis not present

## 2021-08-11 DIAGNOSIS — M25641 Stiffness of right hand, not elsewhere classified: Secondary | ICD-10-CM | POA: Diagnosis not present

## 2021-08-11 DIAGNOSIS — M79642 Pain in left hand: Secondary | ICD-10-CM | POA: Diagnosis not present

## 2021-08-11 DIAGNOSIS — M25531 Pain in right wrist: Secondary | ICD-10-CM | POA: Diagnosis not present

## 2021-08-11 DIAGNOSIS — R262 Difficulty in walking, not elsewhere classified: Secondary | ICD-10-CM | POA: Diagnosis not present

## 2021-08-11 DIAGNOSIS — B359 Dermatophytosis, unspecified: Secondary | ICD-10-CM | POA: Diagnosis not present

## 2021-08-13 ENCOUNTER — Encounter: Payer: Self-pay | Admitting: Occupational Therapy

## 2021-08-13 NOTE — Therapy (Signed)
[facility] [address], [place], [address] Phone: [phone]   Fax:  [phone]  Occupational Therapy Treatment  Patient Details  Name: Angela [name] MRN: [id number] Date of Birth: Mar 07, 1950 Referring Provider (OT): Kristen Cardinal   Encounter Date: 08/09/2021   OT End of Session - 08/13/21 1537     Visit Number 4    Number of Visits 12    Date for OT Re-Evaluation 09/12/21    OT Start Time 1430    OT Stop Time 1515    OT Time Calculation (min) 45 min    Activity Tolerance Patient tolerated treatment well    Behavior During Therapy [facility] for tasks assessed/performed             Past Medical History:  Diagnosis Date   Agitation 09/18/2017   Allergic rhinitis    Altered mental status, unspecified 12/25/2018   Anemia    Blurred vision    Chest pain of uncertain etiology 3/33/5456   Confusion state 12/25/2018   COPD (chronic obstructive pulmonary disease) ([place])    Depression    Diverticulosis    Endometriosis    Falls    GERD (gastroesophageal reflux disease)    Hernia, hiatal    Hypothyroidism    IBS (irritable bowel syndrome)    Labile mood    Malignant neoplasm of skin    Malnutrition of mild degree ([place]) 08/08/2018   Migraine    Neuropathy    Noncompliance 12/25/2017   PTSD (post-traumatic stress disorder)    SIADH (syndrome of inappropriate ADH production) ([place]) 09/08/2016   Thyroid disease     Past Surgical History:  Procedure Laterality Date   APPENDECTOMY     CHOLECYSTECTOMY     CHOLECYSTECTOMY, LAPAROSCOPIC     ESOPHAGOGASTRODUODENOSCOPY (EGD) WITH PROPOFOL N/A 03/29/2015   Procedure: ESOPHAGOGASTRODUODENOSCOPY (EGD) WITH PROPOFOL;  Surgeon: Josefine Class, MD;  Location: [facility] ENDOSCOPY;  Service: Endoscopy;  Laterality: N/A;   HERNIA REPAIR     laproscopy     TONSILLECTOMY     uteral suspension      There were no vitals filed for this visit.   Subjective  Assessment - 08/13/21 1533     Subjective  Pt asking when the fluidotherapy machine will be fixed, indicates disappointment that the part is on back order for a while. Reports she has a telehealth appt after therapy today.    Pertinent History Patient was seen years ago by this OT for bilateral carpal tunnel symptoms.  As well as hand pain.  Patient returned this date with reports of fracture to right fifth digit.  Unable to make a fist pain or dorsal hand as well as hand and volar forearm or wrist.  10/10 pain reported tenderness as well as swelling.  Patient referred to OT for bilateral hand pain.    Patient Stated Goals I want the pain better my right hand so that I can use it as my hand that I need to cut with, grip with, Write with, use my walker and bathe and dress    Currently in Pain? Yes    Pain Score 2     Pain Location Hand    Pain Orientation Right    Pain Descriptors / Indicators Aching;Tightness    Pain Type Acute pain    Pain Onset More than a month ago    Pain Frequency Constant  Contrast bath to bilateral hands and wrists for 11 mins to increase tissue mobility, improve ROM and decrease pain.  Performed prior to manual therapy. Pt reports she is unable to perform contrast at the hotel she is staying.   Manual Therapy:  Pt seen for soft tissue massage to right and left hands, metacarpal and carpal spreads, use of Graston #2 with sweeping over bilateral forearms, brushing over palm.  Use of mini massager over volar digits of right hand.  Remains tender with tightness over 4th and 5th digits.    Therapeutic Exercise:   Tendon gliding exercises with therapist demo and cues for proper form and technique.  MCP flexion to 90 degrees but remains limited at PIP and with fisting.    Pt to continue with massage as a part of her home program.                      OT Education - 08/13/21 1537     Education provided Yes    Education Details Home program  reviewed and progress    Person(s) Educated Patient    Methods Explanation;Demonstration;Verbal cues;Handout    Comprehension Verbalized understanding;Returned demonstration              OT Short Term Goals - 03/12/18 1850       OT SHORT TERM GOAL #1   Title Pain on PRHWE improve by at least 15 points     Baseline pain at [facility] on [facility] 39/50    Time 3    Period Weeks    Status New    Target Date 04/02/18               OT Long Term Goals - 07/18/21 1728       OT LONG TERM GOAL #1   Title Patient to be independent in the home program to decrease pain and edema in the right hand to be able to improve flexion extension of digits.    Baseline Patient with increased edema and pain 10/10 in right hand with severe stiffness and digit flexion and extension.  As well as wrist decreased flexion extension    Time 3    Period Weeks    Status New    Target Date 08/08/21      OT LONG TERM GOAL #2   Title Right digit extension improved to within normal limits for patient to don glove and put hand in pocket with ease.    Baseline Right digit extension -15 to -25 at [facility] with hyperextension at the [facility] some pain over dorsal digits    Time 3    Period Weeks    Status New    Target Date 08/08/21      OT LONG TERM GOAL #3   Title Right digits flexion improved for patient to be able to make a composite fist touching palm with pain less than a 2/10 to use walker    Baseline Pain 10/10 in right hand with active range of motion.  MC's 80 degrees, PIPs 80-90.    Time 6    Period Weeks    Status New    Target Date 08/29/21      OT LONG TERM GOAL #4   Title Right wrist extension and flexion improve within normal limits to push and pull door open use walker and wipe table    Baseline Right wrist extension 52 , flexion 70 with pain over dorsal wrist 10/10    Time  7    Period Weeks    Status New    Target Date 09/05/21      OT LONG TERM GOAL #5   Title Bilateral grip strength improved  within functional limits for patient's age to be able to grip utensils and perform ADLs with pain less than 2/    Baseline Not tested patient with limited range of motion flexion extension, increase edema and stiffness and pain 10/10 in the right hand, left hand 4-6/10 pain in palm and PIPs    Time 8    Period Weeks    Status New    Target Date 09/12/21                   Plan - 08/13/21 1537     Clinical Impression Statement Patient presented OT evaluation with a diagnosis of bilateral hand pain.  Patient reports she some had falls with  the last 6 months. Had many falls last year per patient.  Patient reports she had a fracture on the right ulnar side of hand New Year's.  Was in a cast and then removed and allowed to continue with range of motion and functional use.  Patient arrived with increased edema and pain in right hand with decreased extension and flexion of digits in the right hand with fifth and fourth the worse.  Pain and tenderness 10/10 at start of episode of care.  As well as decreased wrist flexion extension with increased pain over dorsal wrist.  Left hand patient report increased discomfort and pain 3/10 in the palm with opening hand as well as PIP joints 6/10 pain.   Most of her limitation continues in fourth and fifth PIP endrange extension -10 as well as flexion and composite and intrinsic.  Performed contrast with soft tissue massage patient's pain on volar PIP improving.  Reinforced with patient to not do tight and forceful grips but loose grips when holding onto a cylinder objects.  Patient limited in functional use of bilateral hand with the right hand the worst and her dominant hand perform ADLs and IADLs.  Patient can benefit from skilled OT services to maximize safety and independence in necessary daily tasks.    OT Occupational Profile and History Problem Focused Assessment - Including review of records relating to presenting problem    Occupational performance  deficits (Please refer to evaluation for details): ADL's;IADL's;Rest and Sleep;Leisure;Play;Social Participation    Body Structure / Function / Physical Skills ADL;Strength;Pain;Edema;UE functional use;IADL;ROM;Flexibility;Decreased knowledge of precautions    Rehab Potential Fair    Clinical Decision Making Several treatment options, min-mod task modification necessary    Comorbidities Affecting Occupational Performance: None    Modification or Assistance to Complete Evaluation  No modification of tasks or assist necessary to complete eval    OT Frequency 2x / week    OT Duration 8 weeks    OT Treatment/Interventions Self-care/ADL training;Ultrasound;Contrast Bath;DME and/or AE instruction;Splinting;Manual Therapy;Therapeutic activities;Patient/family education    Consulted and Agree with Plan of Care Patient             Patient will benefit from skilled therapeutic intervention in order to improve the following deficits and impairments:   Body Structure / Function / Physical Skills: ADL, Strength, Pain, Edema, UE functional use, IADL, ROM, Flexibility, Decreased knowledge of precautions       Visit Diagnosis: Pain in both hands  Stiffness of right hand, not elsewhere classified  Stiffness of right wrist, not elsewhere classified  Pain in right wrist  Muscle weakness (generalized)    Problem List Patient Active Problem List   Diagnosis Date Noted   Mixed hyperlipidemia 07/02/2019   Hypothyroidism due to acquired atrophy of thyroid 04/29/2019   Osteopenia determined by x-ray 04/29/2019   Acquired claw toe of both feet 10/23/2018   Dermatitis 10/22/2018   Mixed stress and urge urinary incontinence 08/08/2018   Magnesium deficiency 08/08/2018   Aortic atherosclerosis ([place]) 05/31/2018   Achilles tendon disorder 04/25/2018   Insomnia 26/41/5830   Diastolic dysfunction 94/07/6806   Mitral regurgitation 03/29/2018   Personality disorder ([place]) 12/25/2017   Arthritis of  hand, degenerative 12/07/2017   Bipolar I disorder, most recent episode mixed, severe with psychotic features ([place]) 09/18/2017   Hallux valgus, acquired 07/05/2017   Hammer toes of both feet 07/05/2017   Unstable ankle 07/05/2017   Chronic bilateral low back pain without sciatica 06/09/2017   Tinea unguium 05/08/2017   Conductive hearing loss of right ear with unrestricted hearing of left ear 04/03/2017   Adjustment disorder with depressed mood 01/18/2017   Migraine 01/18/2017   Irritable bowel syndrome with constipation 11/17/2016   Bilateral carpal tunnel syndrome 11/17/2016   Plantar fasciitis, bilateral 11/17/2016   Cervical myofascial pain syndrome 11/17/2016   Chronic hyponatremia 11/17/2016   PTSD (post-traumatic stress disorder) 11/17/2016   Hiatal hernia 11/17/2016   Vitamin D deficiency 11/17/2016   Vitamin B12 deficiency 11/17/2016   Cervical spondylosis with myelopathy 05/24/2016   MCI (mild cognitive impairment) with memory loss 03/30/2016   Gait abnormality 03/30/2016   Chronic bipolar affective disorder ([place]) 01/24/2016   Recurrent major depressive disorder, in partial remission ([place]) 01/24/2016   Anemia, iron deficiency 06/24/2015   Benign essential HTN 06/24/2015   History of falling 05/22/2015   History of TIA (transient ischemic attack) 04/20/2015   Gastroesophageal reflux disease with hiatal hernia 04/20/2015   Cervical disc disorder at C5-C6 level with radiculopathy 03/25/2015   Chronic neck pain 03/25/2015   Pelvic pain in female 10/30/2014   History of skin cancer 06/30/2014   Chronic headaches 12/16/2013   Neuropathy 12/16/2013   Jaiveer Panas T Ahmya Bernick, OTR/L, CLT  Yaphet Smethurst, OT 08/13/2021, 3:42 PM  [facility] [address], [place], [address] Phone: [phone]   Fax:  [phone]  Name: Angela [name] MRN: [id number] Date of Birth: 1950/07/08

## 2021-08-13 NOTE — Therapy (Signed)
[facility] [address], [place], [address] Phone: [phone]   Fax:  [phone]  Occupational Therapy Treatment  Patient Details  Name: Angela [name] MRN: [id number] Date of Birth: [birth year] Referring Provider (OT): Kristen Cardinal   Encounter Date: 08/11/2021   OT End of Session - 08/13/21 1858     Visit Number 5    Number of Visits 12    Date for OT Re-Evaluation 09/12/21    OT Start Time 1440    OT Stop Time 1524    OT Time Calculation (min) 44 min    Activity Tolerance Patient tolerated treatment well    Behavior During Therapy [facility] for tasks assessed/performed             Past Medical History:  Diagnosis Date   Agitation 09/18/2017   Allergic rhinitis    Altered mental status, unspecified 12/25/2018   Anemia    Blurred vision    Chest pain of uncertain etiology 05/10/9561   Confusion state 12/25/2018   COPD (chronic obstructive pulmonary disease) ([place])    Depression    Diverticulosis    Endometriosis    Falls    GERD (gastroesophageal reflux disease)    Hernia, hiatal    Hypothyroidism    IBS (irritable bowel syndrome)    Labile mood    Malignant neoplasm of skin    Malnutrition of mild degree ([place]) 08/08/2018   Migraine    Neuropathy    Noncompliance 12/25/2017   PTSD (post-traumatic stress disorder)    SIADH (syndrome of inappropriate ADH production) ([place]) 09/08/2016   Thyroid disease     Past Surgical History:  Procedure Laterality Date   APPENDECTOMY     CHOLECYSTECTOMY     CHOLECYSTECTOMY, LAPAROSCOPIC     ESOPHAGOGASTRODUODENOSCOPY (EGD) WITH PROPOFOL N/A 03/29/2015   Procedure: ESOPHAGOGASTRODUODENOSCOPY (EGD) WITH PROPOFOL;  Surgeon: Josefine Class, MD;  Location: [facility] ENDOSCOPY;  Service: Endoscopy;  Laterality: N/A;   HERNIA REPAIR     laproscopy     TONSILLECTOMY     uteral suspension      There were no vitals filed for this visit.   Subjective  Assessment - 08/13/21 1855     Subjective  Pt reports she got side tracked last time and missed her time to call in for her telehealth appt, she is going to try to do it today.  She reports her hands are feeling much better today and showing therapist how she can move her fingers/fisting.    Pertinent History Patient was seen years ago by this OT for bilateral carpal tunnel symptoms.  As well as hand pain.  Patient returned this date with reports of fracture to right fifth digit.  Unable to make a fist pain or dorsal hand as well as hand and volar forearm or wrist.  10/10 pain reported tenderness as well as swelling.  Patient referred to OT for bilateral hand pain.    Patient Stated Goals I want the pain better my right hand so that I can use it as my hand that I need to cut with, grip with, Write with, use my walker and bathe and dress    Currently in Pain? Yes    Pain Score 2     Pain Location Hand    Pain Orientation Right    Pain Descriptors / Indicators Aching;Tightness    Pain Type Acute pain    Pain Onset More than a month ago  Rationale for Evaluation and Treatment Rehabilitation  Pt brought in foot massager she bought at [facility] to show therapist and also reports using for her hands and it felt really good.  Reports her hands are feeling good today.   Contrast bath to bilateral hands and wrists for 8 mins to increase tissue mobility, improve ROM and decrease pain.  Performed prior to manual therapy. Pt reports she is still unable to perform contrast at the hotel she is staying.    Manual Therapy:  Pt seen for soft tissue massage to right and left hands, metacarpal and carpal spreads, use of Graston #2 with sweeping over bilateral forearms, brushing over palm.  Use of mini massager over volar digits of right hand.  Remains tender with tightness over 4th and 5th digits.     Therapeutic Exercise:   Tendon gliding exercises with therapist demo and cues for proper form and  technique.  AAROM to digits for flexion/extension, measurements taken this date, please refer for details. Wrist flexion, extension   Pt to continue with massage as a part of her home program as well as exercises for ROM  Will reassess grip, pinch and wrist motion next session.   [facility] OT Assessment - 08/13/21 1904       Right Hand AROM   R Index  MCP 0-90 90 Degrees    R Index PIP 0-100 100 Degrees    R Long  MCP 0-90 90 Degrees    R Long PIP 0-100 95 Degrees    R Ring  MCP 0-90 90 Degrees    R Ring PIP 0-100 90 Degrees    R Little  MCP 0-90 90 Degrees   extension to 0   R Little PIP 0-100 85 Degrees   extension of PIP -15 degrees                             OT Education - 08/13/21 1858     Education provided Yes    Education Details Home program reviewed and progress    Person(s) Educated Patient    Methods Explanation;Demonstration;Verbal cues;Handout    Comprehension Verbalized understanding;Returned demonstration              OT Short Term Goals - 03/12/18 1850       OT SHORT TERM GOAL #1   Title Pain on PRHWE improve by at least 15 points     Baseline pain at [facility] on [facility] 39/50    Time 3    Period Weeks    Status New    Target Date 04/02/18               OT Long Term Goals - 07/18/21 1728       OT LONG TERM GOAL #1   Title Patient to be independent in the home program to decrease pain and edema in the right hand to be able to improve flexion extension of digits.    Baseline Patient with increased edema and pain 10/10 in right hand with severe stiffness and digit flexion and extension.  As well as wrist decreased flexion extension    Time 3    Period Weeks    Status New    Target Date 08/08/21      OT LONG TERM GOAL #2   Title Right digit extension improved to within normal limits for patient to don glove and put hand in pocket with ease.    Baseline  Right digit extension -15 to -25 at [facility] with hyperextension at the [facility] some  pain over dorsal digits    Time 3    Period Weeks    Status New    Target Date 08/08/21      OT LONG TERM GOAL #3   Title Right digits flexion improved for patient to be able to make a composite fist touching palm with pain less than a 2/10 to use walker    Baseline Pain 10/10 in right hand with active range of motion.  MC's 80 degrees, PIPs 80-90.    Time 6    Period Weeks    Status New    Target Date 08/29/21      OT LONG TERM GOAL #4   Title Right wrist extension and flexion improve within normal limits to push and pull door open use walker and wipe table    Baseline Right wrist extension 52 , flexion 70 with pain over dorsal wrist 10/10    Time 7    Period Weeks    Status New    Target Date 09/05/21      OT LONG TERM GOAL #5   Title Bilateral grip strength improved within functional limits for patient's age to be able to grip utensils and perform ADLs with pain less than 2/    Baseline Not tested patient with limited range of motion flexion extension, increase edema and stiffness and pain 10/10 in the right hand, left hand 4-6/10 pain in palm and PIPs    Time 8    Period Weeks    Status New    Target Date 09/12/21                   Plan - 08/13/21 1900     Clinical Impression Statement Patient presented OT evaluation with a diagnosis of bilateral hand pain.  Patient reports she some had falls with  the last 6 months. Had many falls last year per patient.  Patient reports she had a fracture on the right ulnar side of hand New Year's.  Was in a cast and then removed and allowed to continue with range of motion and functional use.  Patient arrived with increased edema and pain in right hand with decreased extension and flexion of digits in the right hand with fifth and fourth the worse.  Pain and tenderness 10/10 at start of episode of care.  As well as decreased wrist flexion extension with increased pain over dorsal wrist.  Left hand patient report increased discomfort  and pain 2/10 in the palm with opening hand as well as PIP joints 5/10 pain.   Most of her limitation continues in fourth and fifth PIP endrange extension -10 to -15 degrees as well as flexion and composite and intrinsic.  Performed contrast with soft tissue massage patient's pain on volar PIP improving.  Reinforced with patient to not do tight and forceful grips but loose grips when holding onto a cylinder objects. Pt with good progress this date, decreased pain and demonstrating improved motion with composite fisting. Patient limited in functional use of bilateral hand with the right hand the worst and her dominant hand perform ADLs and IADLs.  Patient can benefit from skilled OT services to maximize safety and independence in necessary daily tasks.    OT Occupational Profile and History Problem Focused Assessment - Including review of records relating to presenting problem    Occupational performance deficits (Please refer to evaluation for details): ADL's;IADL's;Rest and  Sleep;Leisure;Play;Social Participation    Body Structure / Function / Physical Skills ADL;Strength;Pain;Edema;UE functional use;IADL;ROM;Flexibility;Decreased knowledge of precautions    Rehab Potential Fair    Clinical Decision Making Several treatment options, min-mod task modification necessary    Comorbidities Affecting Occupational Performance: None    Modification or Assistance to Complete Evaluation  No modification of tasks or assist necessary to complete eval    OT Frequency 2x / week    OT Duration 8 weeks    OT Treatment/Interventions Self-care/ADL training;Ultrasound;Contrast Bath;DME and/or AE instruction;Splinting;Manual Therapy;Therapeutic activities;Patient/family education    Consulted and Agree with Plan of Care Patient             Patient will benefit from skilled therapeutic intervention in order to improve the following deficits and impairments:   Body Structure / Function / Physical Skills: ADL,  Strength, Pain, Edema, UE functional use, IADL, ROM, Flexibility, Decreased knowledge of precautions       Visit Diagnosis: Pain in both hands  Pain in right wrist  Stiffness of right hand, not elsewhere classified  Muscle weakness (generalized)  Stiffness of right wrist, not elsewhere classified    Problem List Patient Active Problem List   Diagnosis Date Noted   Mixed hyperlipidemia 07/02/2019   Hypothyroidism due to acquired atrophy of thyroid 04/29/2019   Osteopenia determined by x-ray 04/29/2019   Acquired claw toe of both feet 10/23/2018   Dermatitis 10/22/2018   Mixed stress and urge urinary incontinence 08/08/2018   Magnesium deficiency 08/08/2018   Aortic atherosclerosis ([place]) 05/31/2018   Achilles tendon disorder 04/25/2018   Insomnia 29/51/8841   Diastolic dysfunction 66/06/3014   Mitral regurgitation 03/29/2018   Personality disorder ([place]) 12/25/2017   Arthritis of hand, degenerative 12/07/2017   Bipolar I disorder, most recent episode mixed, severe with psychotic features ([place]) 09/18/2017   Hallux valgus, acquired 07/05/2017   Hammer toes of both feet 07/05/2017   Unstable ankle 07/05/2017   Chronic bilateral low back pain without sciatica 06/09/2017   Tinea unguium 05/08/2017   Conductive hearing loss of right ear with unrestricted hearing of left ear 04/03/2017   Adjustment disorder with depressed mood 01/18/2017   Migraine 01/18/2017   Irritable bowel syndrome with constipation 11/17/2016   Bilateral carpal tunnel syndrome 11/17/2016   Plantar fasciitis, bilateral 11/17/2016   Cervical myofascial pain syndrome 11/17/2016   Chronic hyponatremia 11/17/2016   PTSD (post-traumatic stress disorder) 11/17/2016   Hiatal hernia 11/17/2016   Vitamin D deficiency 11/17/2016   Vitamin B12 deficiency 11/17/2016   Cervical spondylosis with myelopathy 05/24/2016   MCI (mild cognitive impairment) with memory loss 03/30/2016   Gait abnormality 03/30/2016    Chronic bipolar affective disorder ([place]) 01/24/2016   Recurrent major depressive disorder, in partial remission ([place]) 01/24/2016   Anemia, iron deficiency 06/24/2015   Benign essential HTN 06/24/2015   History of falling 05/22/2015   History of TIA (transient ischemic attack) 04/20/2015   Gastroesophageal reflux disease with hiatal hernia 04/20/2015   Cervical disc disorder at C5-C6 level with radiculopathy 03/25/2015   Chronic neck pain 03/25/2015   Pelvic pain in female 10/30/2014   History of skin cancer 06/30/2014   Chronic headaches 12/16/2013   Neuropathy 12/16/2013   Malakie Balis T Babyboy Loya, OTR/L, CLT  Sheralee Qazi, OT 08/13/2021, 7:09 PM  [facility] [address] [place], [address] Phone: [phone]   Fax:  [phone]  Name: Euleta Belson MRN: [id number] Date of Birth: [birth year]

## 2021-08-15 ENCOUNTER — Ambulatory Visit: Payer: Medicare Other | Admitting: Physical Therapy

## 2021-08-15 ENCOUNTER — Encounter: Payer: Medicare Other | Admitting: Physical Therapy

## 2021-08-16 ENCOUNTER — Ambulatory Visit: Payer: Medicare Other | Attending: Family Medicine | Admitting: Occupational Therapy

## 2021-08-16 DIAGNOSIS — M79641 Pain in right hand: Secondary | ICD-10-CM | POA: Diagnosis not present

## 2021-08-16 DIAGNOSIS — M25531 Pain in right wrist: Secondary | ICD-10-CM | POA: Diagnosis not present

## 2021-08-16 DIAGNOSIS — M25631 Stiffness of right wrist, not elsewhere classified: Secondary | ICD-10-CM | POA: Diagnosis not present

## 2021-08-16 DIAGNOSIS — R296 Repeated falls: Secondary | ICD-10-CM | POA: Insufficient documentation

## 2021-08-16 DIAGNOSIS — M79642 Pain in left hand: Secondary | ICD-10-CM | POA: Insufficient documentation

## 2021-08-16 DIAGNOSIS — M25641 Stiffness of right hand, not elsewhere classified: Secondary | ICD-10-CM | POA: Diagnosis not present

## 2021-08-16 DIAGNOSIS — M6281 Muscle weakness (generalized): Secondary | ICD-10-CM | POA: Diagnosis not present

## 2021-08-16 DIAGNOSIS — M542 Cervicalgia: Secondary | ICD-10-CM | POA: Insufficient documentation

## 2021-08-17 ENCOUNTER — Encounter: Payer: Medicare Other | Admitting: Physical Therapy

## 2021-08-18 ENCOUNTER — Telehealth: Payer: Self-pay | Admitting: Physical Therapy

## 2021-08-18 ENCOUNTER — Ambulatory Visit: Payer: Medicare Other | Admitting: Physical Therapy

## 2021-08-18 ENCOUNTER — Ambulatory Visit: Payer: Medicare Other | Admitting: Occupational Therapy

## 2021-08-18 DIAGNOSIS — R296 Repeated falls: Secondary | ICD-10-CM | POA: Diagnosis not present

## 2021-08-18 DIAGNOSIS — M6281 Muscle weakness (generalized): Secondary | ICD-10-CM

## 2021-08-18 DIAGNOSIS — M25641 Stiffness of right hand, not elsewhere classified: Secondary | ICD-10-CM | POA: Diagnosis not present

## 2021-08-18 DIAGNOSIS — M79641 Pain in right hand: Secondary | ICD-10-CM | POA: Diagnosis not present

## 2021-08-18 DIAGNOSIS — M25531 Pain in right wrist: Secondary | ICD-10-CM

## 2021-08-18 DIAGNOSIS — M79642 Pain in left hand: Secondary | ICD-10-CM | POA: Diagnosis not present

## 2021-08-18 DIAGNOSIS — M25631 Stiffness of right wrist, not elsewhere classified: Secondary | ICD-10-CM

## 2021-08-18 DIAGNOSIS — M542 Cervicalgia: Secondary | ICD-10-CM | POA: Diagnosis not present

## 2021-08-18 NOTE — Telephone Encounter (Signed)
Called pt to inquiry if she wanted to move apt to earlier time because of increased availability. She reports that she would be interested in having initial eval today after her OT appointment.

## 2021-08-18 NOTE — Therapy (Signed)
[facility] [address], [place], [address] Phone: [phone]   Fax:  [phone]  Occupational Therapy Treatment  Patient Details  Name: Angela [name] MRN: [id number] Date of Birth: April 29, 1950 Referring Provider (OT): Kristen Cardinal   Encounter Date: 08/16/2021   OT End of Session - 08/18/21 1335     Visit Number 6    Number of Visits 12    Date for OT Re-Evaluation 09/12/21    OT Start Time 1500    OT Stop Time 1602    OT Time Calculation (min) 62 min    Activity Tolerance Patient tolerated treatment well    Behavior During Therapy [facility] for tasks assessed/performed             Past Medical History:  Diagnosis Date   Agitation 09/18/2017   Allergic rhinitis    Altered mental status, unspecified 12/25/2018   Anemia    Blurred vision    Chest pain of uncertain etiology 7/32/2025   Confusion state 12/25/2018   COPD (chronic obstructive pulmonary disease) ([place])    Depression    Diverticulosis    Endometriosis    Falls    GERD (gastroesophageal reflux disease)    Hernia, hiatal    Hypothyroidism    IBS (irritable bowel syndrome)    Labile mood    Malignant neoplasm of skin    Malnutrition of mild degree ([place]) 08/08/2018   Migraine    Neuropathy    Noncompliance 12/25/2017   PTSD (post-traumatic stress disorder)    SIADH (syndrome of inappropriate ADH production) ([place]) 09/08/2016   Thyroid disease     Past Surgical History:  Procedure Laterality Date   APPENDECTOMY     CHOLECYSTECTOMY     CHOLECYSTECTOMY, LAPAROSCOPIC     ESOPHAGOGASTRODUODENOSCOPY (EGD) WITH PROPOFOL N/A 03/29/2015   Procedure: ESOPHAGOGASTRODUODENOSCOPY (EGD) WITH PROPOFOL;  Surgeon: Josefine Class, MD;  Location: [facility] ENDOSCOPY;  Service: Endoscopy;  Laterality: N/A;   HERNIA REPAIR     laproscopy     TONSILLECTOMY     uteral suspension      There were no vitals filed for this visit.   Subjective  Assessment - 08/18/21 1334     Subjective  Pt reports pain has increased in her fingers since last visit, 7/10 pain and reports her blood pressure was up this morning.    Pertinent History Patient was seen years ago by this OT for bilateral carpal tunnel symptoms.  As well as hand pain.  Patient returned this date with reports of fracture to right fifth digit.  Unable to make a fist pain or dorsal hand as well as hand and volar forearm or wrist.  10/10 pain reported tenderness as well as swelling.  Patient referred to OT for bilateral hand pain.    Patient Stated Goals I want the pain better my right hand so that I can use it as my hand that I need to cut with, grip with, Write with, use my walker and bathe and dress    Currently in Pain? Yes    Pain Score 7     Pain Location Hand    Pain Orientation Right    Pain Descriptors / Indicators Aching;Tightness    Pain Type Acute pain    Pain Onset More than a month ago    Pain Frequency Constant             Rationale for Evaluation and Treatment Rehabilitation  Contrast  bath to bilateral hands and wrists for 11 mins to increase tissue mobility, improve ROM and decrease pain.  Performed prior to manual therapy.    Manual Therapy:  Pt seen for soft tissue massage to right and left hands, metacarpal and carpal spreads, use of [place]aston #2 with sweeping over bilateral forearms, brushing over palm.  Use of mini massager over volar digits of right hand.     Therapeutic Exercise:   Tendon gliding exercises with therapist demo and cues for proper form and technique.  AAROM to digits for flexion/extension Wrist flexion, extension.  Difficulty with composite fisting this date with increased pain in bilateral hands, right worse than left.    Issued additional red foam built up handle to use at home on hand tools/utensil/pen for increased grip surface.     Pt to continue with massage as a part of her home program as well as exercises for ROM  Had  planned to recheck grip and pinch this date however with increased pain and inflammation, will defer to next session.                      OT Education - 08/18/21 1335     Education provided Yes    Education Details contrast, massage, exercises    Person(s) Educated Patient    Methods Explanation;Demonstration;Verbal cues;Handout    Comprehension Verbalized understanding;Returned demonstration              OT Short Term Goals - 03/12/18 1850       OT SHORT TERM GOAL #1   Title Pain on PRHWE improve by at least 15 points     Baseline pain at [facility] on [facility] 39/50    Time 3    Period Weeks    Status New    Target Date 04/02/18               OT Long Term Goals - 07/18/21 1728       OT LONG TERM GOAL #1   Title Patient to be independent in the home program to decrease pain and edema in the right hand to be able to improve flexion extension of digits.    Baseline Patient with increased edema and pain 10/10 in right hand with severe stiffness and digit flexion and extension.  As well as wrist decreased flexion extension    Time 3    Period Weeks    Status New    Target Date 08/08/21      OT LONG TERM GOAL #2   Title Right digit extension improved to within normal limits for patient to don glove and put hand in pocket with ease.    Baseline Right digit extension -15 to -25 at [facility] with hyperextension at the [facility] some pain over dorsal digits    Time 3    Period Weeks    Status New    Target Date 08/08/21      OT LONG TERM GOAL #3   Title Right digits flexion improved for patient to be able to make a composite fist touching palm with pain less than a 2/10 to use walker    Baseline Pain 10/10 in right hand with active range of motion.  MC's 80 degrees, PIPs 80-90.    Time 6    Period Weeks    Status New    Target Date 08/29/21      OT LONG TERM GOAL #4   Title Right wrist extension and flexion  improve within normal limits to push and pull door open use  walker and wipe table    Baseline Right wrist extension 52 , flexion 70 with pain over dorsal wrist 10/10    Time 7    Period Weeks    Status New    Target Date 09/05/21      OT LONG TERM GOAL #5   Title Bilateral grip strength improved within functional limits for patient's age to be able to grip utensils and perform ADLs with pain less than 2/    Baseline Not tested patient with limited range of motion flexion extension, increase edema and stiffness and pain 10/10 in the right hand, left hand 4-6/10 pain in palm and PIPs    Time 8    Period Weeks    Status New    Target Date 09/12/21                   Plan - 08/18/21 1336     Clinical Impression Statement Patient presented OT evaluation with a diagnosis of bilateral hand pain.  Patient reports she some had falls with  the last 6 months. Had many falls last year per patient.  Patient reports she had a fracture on the right ulnar side of hand New Year's.  Was in a cast and then removed and allowed to continue with range of motion and functional use.  Patient arrived with increased edema and pain in right hand with decreased extension and flexion of digits in the right hand with fifth and fourth the worse.  Pain and tenderness 10/10 at start of episode of care.  As well as decreased wrist flexion extension with increased pain over dorsal wrist.  Left hand patient report increased discomfort and pain 2/10 in the palm with opening hand as well as PIP joints 5/10 pain.   Most of her limitation continues in fourth and fifth PIP endrange extension -10 to -15 degrees as well as flexion and composite and intrinsic.  Performed contrast with soft tissue massage patient's pain on volar PIP improving.  Reinforced with patient to not do tight and forceful grips but loose grips when holding onto a cylinder objects. Pt with good progress this date, decreased pain and demonstrating improved motion with composite fisting. Patient limited in functional use  of bilateral hand with the right hand the worst and her dominant hand perform ADLs and IADLs.  Patient can benefit from skilled OT services to maximize safety and independence in necessary daily tasks.    OT Occupational Profile and History Problem Focused Assessment - Including review of records relating to presenting problem    Occupational performance deficits (Please refer to evaluation for details): ADL's;IADL's;Rest and Sleep;Leisure;Play;Social Participation    Body Structure / Function / Physical Skills ADL;Strength;Pain;Edema;UE functional use;IADL;ROM;Flexibility;Decreased knowledge of precautions    Rehab Potential Fair    Clinical Decision Making Several treatment options, min-mod task modification necessary    Comorbidities Affecting Occupational Performance: None    Modification or Assistance to Complete Evaluation  No modification of tasks or assist necessary to complete eval    OT Frequency 2x / week    OT Duration 8 weeks    OT Treatment/Interventions Self-care/ADL training;Ultrasound;Contrast Bath;DME and/or AE instruction;Splinting;Manual Therapy;Therapeutic activities;Patient/family education    Consulted and Agree with Plan of Care Patient             Patient will benefit from skilled therapeutic intervention in order to improve the following deficits and impairments:   Body  Structure / Function / Physical Skills: ADL, Strength, Pain, Edema, UE functional use, IADL, ROM, Flexibility, Decreased knowledge of precautions       Visit Diagnosis: Pain in both hands  Pain in right wrist  Stiffness of right hand, not elsewhere classified  Muscle weakness (generalized)  Stiffness of right wrist, not elsewhere classified    Problem List Patient Active Problem List   Diagnosis Date Noted   Mixed hyperlipidemia 07/02/2019   Hypothyroidism due to acquired atrophy of thyroid 04/29/2019   Osteopenia determined by x-ray 04/29/2019   Acquired claw toe of both feet  10/23/2018   Dermatitis 10/22/2018   Mixed stress and urge urinary incontinence 08/08/2018   Magnesium deficiency 08/08/2018   Aortic atherosclerosis ([place]) 05/31/2018   Achilles tendon disorder 04/25/2018   Insomnia 03/88/8280   Diastolic dysfunction 03/49/1791   Mitral regurgitation 03/29/2018   Personality disorder ([place]) 12/25/2017   Arthritis of hand, degenerative 12/07/2017   Bipolar I disorder, most recent episode mixed, severe with psychotic features ([place]) 09/18/2017   Hallux valgus, acquired 07/05/2017   Hammer toes of both feet 07/05/2017   Unstable ankle 07/05/2017   Chronic bilateral low back pain without sciatica 06/09/2017   Tinea unguium 05/08/2017   Conductive hearing loss of right ear with unrestricted hearing of left ear 04/03/2017   Adjustment disorder with depressed mood 01/18/2017   Migraine 01/18/2017   Irritable bowel syndrome with constipation 11/17/2016   Bilateral carpal tunnel syndrome 11/17/2016   Plantar fasciitis, bilateral 11/17/2016   Cervical myofascial pain syndrome 11/17/2016   Chronic hyponatremia 11/17/2016   PTSD (post-traumatic stress disorder) 11/17/2016   Hiatal hernia 11/17/2016   Vitamin D deficiency 11/17/2016   Vitamin B12 deficiency 11/17/2016   Cervical spondylosis with myelopathy 05/24/2016   MCI (mild cognitive impairment) with memory loss 03/30/2016   Gait abnormality 03/30/2016   Chronic bipolar affective disorder ([place]) 01/24/2016   Recurrent major depressive disorder, in partial remission ([place]) 01/24/2016   Anemia, iron deficiency 06/24/2015   Benign essential HTN 06/24/2015   History of falling 05/22/2015   History of TIA (transient ischemic attack) 04/20/2015   Gastroesophageal reflux disease with hiatal hernia 04/20/2015   Cervical disc disorder at C5-C6 level with radiculopathy 03/25/2015   Chronic neck pain 03/25/2015   Pelvic pain in female 10/30/2014   History of skin cancer 06/30/2014   Chronic headaches 12/16/2013    Neuropathy 12/16/2013   Angela [name], OTR/L, CLT  Angela [name], OT 08/18/2021, 1:45 PM  [facility] [address], [place], [address] Phone: [phone]   Fax:  [phone]  Name: Angela [name] MRN: [id number] Date of Birth: [birth year]

## 2021-08-18 NOTE — Therapy (Signed)
OUTPATIENT PHYSICAL THERAPY CERVICAL EVALUATION   Patient Name: Angela [name] MRN: [id number] DOB:[birth year], 71 y.o., female Today's Date: 08/18/2021   PT End of Session - 08/18/21 1650     Visit Number 1    Number of Visits 16    Date for PT Re-Evaluation 10/13/21    Authorization Type Medicare/Medicaid    PT Start Time 1525    PT Stop Time 1615    PT Time Calculation (min) 50 min    Activity Tolerance Patient limited by pain    Behavior During Therapy  [facility] for tasks assessed/performed             Past Medical History:  Diagnosis Date   Agitation 09/18/2017   Allergic rhinitis    Altered mental status, unspecified 12/25/2018   Anemia    Blurred vision    Chest pain of uncertain etiology 7/82/9562   Confusion state 12/25/2018   COPD (chronic obstructive pulmonary disease) (HCC)    Depression    Diverticulosis    Endometriosis    Falls    GERD (gastroesophageal reflux disease)    Hernia, hiatal    Hypothyroidism    IBS (irritable bowel syndrome)    Labile mood    Malignant neoplasm of skin    Malnutrition of mild degree ([place]) 08/08/2018   Migraine    Neuropathy    Noncompliance 12/25/2017   PTSD (post-traumatic stress disorder)    SIADH (syndrome of inappropriate ADH production) ([place]) 09/08/2016   Thyroid disease    Past Surgical History:  Procedure Laterality Date   APPENDECTOMY     CHOLECYSTECTOMY     CHOLECYSTECTOMY, LAPAROSCOPIC     ESOPHAGOGASTRODUODENOSCOPY (EGD) WITH PROPOFOL N/A 03/29/2015   Procedure: ESOPHAGOGASTRODUODENOSCOPY (EGD) WITH PROPOFOL;  Surgeon: Josefine Class, MD;  Location: [facility] ENDOSCOPY;  Service: Endoscopy;  Laterality: N/A;   HERNIA REPAIR     laproscopy     TONSILLECTOMY     uteral suspension     Patient Active Problem List   Diagnosis Date Noted   Mixed hyperlipidemia 07/02/2019   Hypothyroidism due to acquired atrophy of thyroid 04/29/2019   Osteopenia determined by x-ray 04/29/2019   Acquired claw toe of both  feet 10/23/2018   Dermatitis 10/22/2018   Mixed stress and urge urinary incontinence 08/08/2018   Magnesium deficiency 08/08/2018   Aortic atherosclerosis ([place]) 05/31/2018   Achilles tendon disorder 04/25/2018   Insomnia 13/08/6576   Diastolic dysfunction 46/96/2952   Mitral regurgitation 03/29/2018   Personality disorder ([place]) 12/25/2017   Arthritis of hand, degenerative 12/07/2017   Bipolar I disorder, most recent episode mixed, severe with psychotic features ([place]) 09/18/2017   Hallux valgus, acquired 07/05/2017   Hammer toes of both feet 07/05/2017   Unstable ankle 07/05/2017   Chronic bilateral low back pain without sciatica 06/09/2017   Tinea unguium 05/08/2017   Conductive hearing loss of right ear with unrestricted hearing of left ear 04/03/2017   Adjustment disorder with depressed mood 01/18/2017   Migraine 01/18/2017   Irritable bowel syndrome with constipation 11/17/2016   Bilateral carpal tunnel syndrome 11/17/2016   Plantar fasciitis, bilateral 11/17/2016   Cervical myofascial pain syndrome 11/17/2016   Chronic hyponatremia 11/17/2016   PTSD (post-traumatic stress disorder) 11/17/2016   Hiatal hernia 11/17/2016   Vitamin D deficiency 11/17/2016   Vitamin B12 deficiency 11/17/2016   Cervical spondylosis with myelopathy 05/24/2016   MCI (mild cognitive impairment) with memory loss 03/30/2016   Gait abnormality 03/30/2016   Chronic bipolar affective disorder ([place]) 01/24/2016  Recurrent major depressive disorder, in partial remission ([place]) 01/24/2016   Anemia, iron deficiency 06/24/2015   Benign essential HTN 06/24/2015   History of falling 05/22/2015   History of TIA (transient ischemic attack) 04/20/2015   Gastroesophageal reflux disease with hiatal hernia 04/20/2015   Cervical disc disorder at C5-C6 level with radiculopathy 03/25/2015   Chronic neck pain 03/25/2015   Pelvic pain in female 10/30/2014   History of skin cancer 06/30/2014   Chronic headaches  12/16/2013   Neuropathy 12/16/2013    PCP: Kristen Cardinal FNP   REFERRING PROVIDER: Dr. Rudene Christians   REFERRING DIAG:   THERAPY DIAG:  Cervicalgia  Frequent falls  Rationale for Evaluation and Treatment Rehabilitation  ONSET DATE: 01/16/2013   SUBJECTIVE:                                                                                                                                                                                                         SUBJECTIVE STATEMENT: Pt reports ongoing neck pain resulting in numbness and tingling in her hands and has had recent housing issues.   PERTINENT HISTORY:   Per patient report:  Pt reports that she has recently moved from apartment in [place] because of black mold. She has since moved to [place]. She has increased congestion because of exposure to black. She describes needing to sit up because of hiatal hernia which presses against her lungs and heart. She also reports falling 20 x in the past year.  She has fallen several times, but believes this is due to black mold and she has now since not falling after being moved.  Pt is unable to lay flat because of hiatal hernia and lay on stomach.    Per Dr. Theodore Demark note on 08/10/21  History of the Present Illness: Angela [name] is a 71 y.o. female here today.  The patient presents for evaluation of back pain. X-ray taken today of the thoracolumbar spine.  The patient states she has fallen about 20 times in the past. She states about 2 years ago she was told she was covered with spider bites. She states she was supposed to live there, and finally she got black mold poisoning. She states she was falling because of the difficult day. She states she has had concussions, a fracture, and COVID-19. She states her pain stays in her back and radiates around to her hips. She states when she wakes up in the morning, she cannot feel her fingertips. She states all her fingertips are numb when she wakes up,  and it is  the way she sleeps. She states she went to [facility], and they are trying to loosen one of her fingers, but that did not loosen up. She states they still work, but Velcro tends to stick on everything, so she can wear them at night. She states during the day she should wear cotton gloves.    PAIN:  Are you having pain? Yes: NPRS scale: 7-8/10 Pain location: Right scapulae, C7-C8 verterbrae, and low back pain  Pain description: Shooting, Achy and Sharp  Aggravating factors:  Relieving factors: Cervical traction, chiropractic adjustments   PRECAUTIONS: None  WEIGHT BEARING RESTRICTIONS No  FALLS:  Has patient fallen in last 6 months? Yes. Number of falls 20  LIVING ENVIRONMENT: Lives with: lives alone Lives in: Transitional housing/Hotel  Stairs: No Has following equipment at home: Environmental consultant - 4 wheeled  OCCUPATION: Works from home   PLOF: [place] to avoid surgery for her right shoulder   OBJECTIVE:               VITALS: BP 148/90 HR 60 SpO2 98%  DIAGNOSTIC FINDINGS:  08/10/21  Radiographs:   AP and lateral x-rays of the thoracolumbar spine were ordered and  personally reviewed today. AP view shows normal alignment. Pedicles are  intact. Lateral view shows L4-L5 grade 1 spondylolisthesis, multilevel  degenerative disc disease. No evidence of acute fracture. There appears to  be significant osteopenia.   X-ray Impression:  Normal alignment with L4-L5 spondylolisthesis of the thoracolumbar spine.  PATIENT SURVEYS:   FOTO 40/100    COGNITION: Overall cognitive status: Within functional limits for tasks assessed   SENSATION: Light touch: Impaired all her finger tips   POSTURE: rounded shoulders and forward head  PALPATION: Bilateral upper traps and cervical paraspinals and right rhomboid    CERVICAL ROM:   Active /Passive ROM A/PROM (deg) eval  Flexion 45  Extension 10/45  Right lateral flexion 45  Left lateral  flexion 45  Right rotation 45  Left rotation 30/60   (Blank rows = not tested)    UPPER EXTREMITY ROM: Right handed      Active ROM Right eval Left eval  Shoulder flexion 180 180  Shoulder extension 60 60  Shoulder abduction 180  180  Shoulder adduction    Shoulder internal rotation 70 70  Shoulder external rotation 90 90  Elbow flexion 150 150  Elbow extension 60 60  Wrist flexion 80 80  Wrist extension 70 70  Wrist ulnar deviation 30 30  Wrist radial deviation 20 20  Wrist pronation    Wrist supination    (Blank rows = not tested)         UPPER EXTREMITY MMT:  Cervical  MMT  Flexion 4+  Extension 4+  Right lateral flexion 4+  Left lateral flexion 4+  Right rotation 4+  Left rotation 4+   (Blank rows = not tested)  MMT Right eval Left eval  Shoulder flexion 4+ 4+  Shoulder extension    Shoulder abduction 4+ 4+  Shoulder adduction    Shoulder extension 4+ 4+  Shoulder internal rotation 4+ 4+  Shoulder external rotation 4+ 4+  Middle trapezius    Lower trapezius    Elbow flexion 4+ 4+  Elbow extension 4+ 4+  Wrist flexion 4+ 4+  Wrist extension 4+ 4+  Wrist ulnar deviation 4+ 4+  Wrist radial deviation 4+ 4+  Wrist pronation 4+ 4+  Wrist supination 4+ 4+  Grip strength Fair Fair    (  Blank rows = not tested)  CERVICAL SPECIAL TESTS:  Upper limb tension test (ULTT): Unable to perform because pt cannot assume supine position, Spurling's test: Negative, and Distraction test: Negative, flexion rotation test: negative   Carpal Tunnel/ Phalen's: Negative bilateral FUNCTIONAL TESTS:  STEADI 4-Stage Balance Test: (inability to maintain tandem stance for 10 sec = increased risk for falls) - feet together, eyes open, noncompliant surface: 10/10 sec - semi-tandem stance, eyes open, noncompliant surface: 10/10 sec - tandem stances, eyes open, noncompliant surface: 3/3 sec - single leg stance:         - R LE: NT          - L LE: NT    TODAY'S  TREATMENT:  Tandem x 5 with 10 sec holds  Scapular Retraction 1 x 10    PATIENT EDUCATION:  Education details: form and technique for appropriate exercise  Person educated: Patient Education method: Explanation, Demonstration, Verbal cues, and Handouts Education comprehension: verbalized understanding, returned demonstration, verbal cues required, and tactile cues required   HOME EXERCISE PROGRAM: Access Code: [email or web address] URL: [email or web address] Date: 08/18/2021 Prepared by: Bradly Chris  Exercises - Seated Scapular Retraction  - 1 x daily - 3 x weekly - 3 sets - 10 reps - Tandem Stance  - 1 x daily - 7 x weekly - 1 sets - 5 reps - 10 hold  ASSESSMENT:  CLINICAL IMPRESSION: Patient is a 71 y.o. female who was seen today for physical therapy evaluation and treatment for chronic neck pain and falls. Despite initial report of numbness and tingling down arms, cervical radiculopathy ruled out during examination along with carpal tunnel.  Pt has experienced several falls over past 6 months but this appears to be in the setting of medical issues from mold exposure. She does present at an increased for falls with decreased static balance and further testing is warranted to determine other deficits contributing to her balance such as sensory input and LE strength and endurance even if she is no longer experiencing falls after moving to new home. Her cervicalgia is likely muscular in origin with pain elicited by AROM but not by PROM and with her having full ROM with PROM and not AROM. She will benefit from skilled PT to improve cervical ROM and resolve deficits related to balance to decrease risk of falling and to remain independent and able to navigate through home and community settings safely.    OBJECTIVE IMPAIRMENTS decreased ROM, decreased strength, impaired sensation, impaired UE functional use, and pain.   ACTIVITY LIMITATIONS carrying, lifting, bending, standing,  squatting, sleeping, bathing, dressing, and self feeding  PARTICIPATION LIMITATIONS: driving, shopping, and community activity  PERSONAL FACTORS Past/current experiences, Social background, Time since onset of injury/illness/exacerbation, and 3+ comorbidities: COPD, Falls,Hiatal Hernia   are also affecting patient's functional outcome.   REHAB POTENTIAL: Good  CLINICAL DECISION MAKING: Stable/uncomplicated  EVALUATION COMPLEXITY: Moderate   GOALS: Goals reviewed with patient? No  SHORT TERM GOALS: Target date: 09/01/2021   Pt will be independent with HEP in order to improve strength and balance in order to decrease fall risk and improve function at home and work. Baseline: NT  Goal status: INITIAL  2.  Patient will perform a sit to stand without use of UE support to show improve LE strength.  Baseline: NT  Goal status: INITIAL   LONG TERM GOALS: Target date: 10/13/2021  Patient will have improved function and activity level as evidenced by an increase in FOTO score by 10 points or  more.  Baseline: 40/100 with target of 48 Goal status: INITIAL  2.  Patient will achieve improved cervical ROM in order to have improved visual scanning to navigate environment to avoid tripping and falling.   Baseline: Cervical AROM Ext 45, Left Rot 30 Goal status: INITIAL  3.  Patient will be able to hold tandem stance for 10 sec on RLE and LLE to exhibit improved static balance and decreased risk of falling.  Baseline: Tandem R/L 3/3 Goal status: INITIAL  4.  Patient will perform 5 x STS in <12 sec to show LE strength that places her at a decreased risk for falling.  Baseline: NT Goal status: INITIAL  5.  Patient will be able to hold all conditions of MCTSIB for 30 sec to show that she has robust sensory systems capable of maintaining her balance to avoid falling.  Baseline: NT Goal status: INITIAL  6.  Patient will perform >=10 chair stands within 30 sec to show LE endurance that places  her at a decreased risk for falls. Baseline: NT Goal status: INITIAL   PLAN: PT FREQUENCY: 1-2x/week  PT DURATION: 8 weeks  PLANNED INTERVENTIONS: Therapeutic exercises, Therapeutic activity, Neuromuscular re-education, Balance training, Gait training, Patient/Family education, Self Care, Joint mobilization, Joint manipulation, Vestibular training, DME instructions, Spinal manipulation, Spinal mobilization, Cryotherapy, Moist heat, Manual therapy, and Re-evaluation  PLAN FOR NEXT SESSION: 5xSTS, 30 sec chair stands, and progress cervical mobility and strengthening exercises   Bradly Chris PT, DPT  08/18/2021, 4:54 PM

## 2021-08-19 ENCOUNTER — Encounter: Payer: Self-pay | Admitting: Occupational Therapy

## 2021-08-19 DIAGNOSIS — I872 Venous insufficiency (chronic) (peripheral): Secondary | ICD-10-CM | POA: Diagnosis not present

## 2021-08-19 DIAGNOSIS — M2041 Other hammer toe(s) (acquired), right foot: Secondary | ICD-10-CM | POA: Diagnosis not present

## 2021-08-19 DIAGNOSIS — G629 Polyneuropathy, unspecified: Secondary | ICD-10-CM | POA: Diagnosis not present

## 2021-08-19 DIAGNOSIS — M545 Low back pain, unspecified: Secondary | ICD-10-CM | POA: Diagnosis not present

## 2021-08-19 DIAGNOSIS — G894 Chronic pain syndrome: Secondary | ICD-10-CM | POA: Diagnosis not present

## 2021-08-19 DIAGNOSIS — M249 Joint derangement, unspecified: Secondary | ICD-10-CM | POA: Diagnosis not present

## 2021-08-19 DIAGNOSIS — M2011 Hallux valgus (acquired), right foot: Secondary | ICD-10-CM | POA: Diagnosis not present

## 2021-08-19 DIAGNOSIS — G90523 Complex regional pain syndrome I of lower limb, bilateral: Secondary | ICD-10-CM | POA: Diagnosis not present

## 2021-08-19 DIAGNOSIS — Z9181 History of falling: Secondary | ICD-10-CM | POA: Diagnosis not present

## 2021-08-19 DIAGNOSIS — M2141 Flat foot [pes planus] (acquired), right foot: Secondary | ICD-10-CM | POA: Diagnosis not present

## 2021-08-19 DIAGNOSIS — M2012 Hallux valgus (acquired), left foot: Secondary | ICD-10-CM | POA: Diagnosis not present

## 2021-08-19 DIAGNOSIS — G8929 Other chronic pain: Secondary | ICD-10-CM | POA: Diagnosis not present

## 2021-08-19 NOTE — Therapy (Signed)
[facility] [address], [place], [address] Phone: [phone]   Fax:  [phone]  Occupational Therapy Treatment  Patient Details  Name: Angela [name] MRN: [id number] Date of Birth: 03-Aug-1950 Referring Provider (OT): Kristen Cardinal   Encounter Date: 08/18/2021   OT End of Session - 08/19/21 0831     Visit Number 7    Number of Visits 12    Date for OT Re-Evaluation 09/12/21    OT Start Time 1430    OT Stop Time 1515    OT Time Calculation (min) 45 min    Activity Tolerance Patient tolerated treatment well    Behavior During Therapy [facility] for tasks assessed/performed             Past Medical History:  Diagnosis Date   Agitation 09/18/2017   Allergic rhinitis    Altered mental status, unspecified 12/25/2018   Anemia    Blurred vision    Chest pain of uncertain etiology 7/67/2094   Confusion state 12/25/2018   COPD (chronic obstructive pulmonary disease) ([place])    Depression    Diverticulosis    Endometriosis    Falls    GERD (gastroesophageal reflux disease)    Hernia, hiatal    Hypothyroidism    IBS (irritable bowel syndrome)    Labile mood    Malignant neoplasm of skin    Malnutrition of mild degree ([place]) 08/08/2018   Migraine    Neuropathy    Noncompliance 12/25/2017   PTSD (post-traumatic stress disorder)    SIADH (syndrome of inappropriate ADH production) ([place]) 09/08/2016   Thyroid disease     Past Surgical History:  Procedure Laterality Date   APPENDECTOMY     CHOLECYSTECTOMY     CHOLECYSTECTOMY, LAPAROSCOPIC     ESOPHAGOGASTRODUODENOSCOPY (EGD) WITH PROPOFOL N/A 03/29/2015   Procedure: ESOPHAGOGASTRODUODENOSCOPY (EGD) WITH PROPOFOL;  Surgeon: Josefine Class, MD;  Location: [facility] ENDOSCOPY;  Service: Endoscopy;  Laterality: N/A;   HERNIA REPAIR     laproscopy     TONSILLECTOMY     uteral suspension      There were no vitals filed for this visit.   Subjective  Assessment - 08/19/21 0829     Subjective  Pt reports her fingers are still tender and painful today but not as bad as the other day.  Reports her stomach as also been upset and not feeling well overall this week.    Pertinent History Patient was seen years ago by this OT for bilateral carpal tunnel symptoms.  As well as hand pain.  Patient returned this date with reports of fracture to right fifth digit.  Unable to make a fist pain or dorsal hand as well as hand and volar forearm or wrist.  10/10 pain reported tenderness as well as swelling.  Patient referred to OT for bilateral hand pain.    Patient Stated Goals I want the pain better my right hand so that I can use it as my hand that I need to cut with, grip with, Write with, use my walker and bathe and dress    Currently in Pain? Yes    Pain Score 5     Pain Location Hand    Pain Orientation Right    Pain Descriptors / Indicators Aching;Tightness    Pain Type Acute pain    Pain Onset More than a month ago    Pain Frequency Constant  Pt reports she felt so bad yesterday she slept til 4 pm and missed all of her appts for the day.  Rationale for Evaluation and Treatment Rehabilitation   Contrast bath to bilateral hands and wrists for 11 mins to increase tissue mobility, improve ROM and decrease pain.  Performed prior to manual therapy.    Manual Therapy:  Pt seen for soft tissue massage to right and left hands , digits, metacarpal and carpal spreads, use of Graston #2 with sweeping over bilateral forearms, brushing over palm.      Therapeutic Exercise:   Tendon gliding exercises with therapist demo and cues for proper form and technique.  AAROM to digits for flexion/extension Wrist flexion, extension.  Continued to demonstrate difficulty with composite fisting this date with increased pain in bilateral hands, right worse than left.  Light grasp and release of objects of varying sizes.   Measurements taken this date for  grip and pinch, see flow sheet for details.    Pt reports she is using red foam built up foam at home on hand tools/utensil/pen for increased grip surface.     Pt to continue with massage as a part of her home program as well as exercises for ROM.  Continue to recommend contrast for edema management although pt reports she is limited to assess for hot and cold based on living in a hotel currently.     [facility] OT Assessment - 08/19/21 0838       Strength   Right Hand Grip (lbs) 30    Right Hand Lateral Pinch 15 lbs    Right Hand 3 Point Pinch 7 lbs    Left Hand Grip (lbs) 27    Left Hand Lateral Pinch 13 lbs    Left Hand 3 Point Pinch 7 lbs                              OT Education - 08/19/21 0830     Education provided Yes    Education Details contrast, massage, exercises    Person(s) Educated Patient    Methods Explanation;Demonstration;Verbal cues;Handout    Comprehension Verbalized understanding;Returned demonstration              OT Short Term Goals - 03/12/18 1850       OT SHORT TERM GOAL #1   Title Pain on PRHWE improve by at least 15 points     Baseline pain at [facility] on [facility] 39/50    Time 3    Period Weeks    Status New    Target Date 04/02/18               OT Long Term Goals - 07/18/21 1728       OT LONG TERM GOAL #1   Title Patient to be independent in the home program to decrease pain and edema in the right hand to be able to improve flexion extension of digits.    Baseline Patient with increased edema and pain 10/10 in right hand with severe stiffness and digit flexion and extension.  As well as wrist decreased flexion extension    Time 3    Period Weeks    Status New    Target Date 08/08/21      OT LONG TERM GOAL #2   Title Right digit extension improved to within normal limits for patient to don glove and put hand in pocket with ease.    Baseline  Right digit extension -15 to -25 at [facility] with hyperextension at the [facility] some  pain over dorsal digits    Time 3    Period Weeks    Status New    Target Date 08/08/21      OT LONG TERM GOAL #3   Title Right digits flexion improved for patient to be able to make a composite fist touching palm with pain less than a 2/10 to use walker    Baseline Pain 10/10 in right hand with active range of motion.  MC's 80 degrees, PIPs 80-90.    Time 6    Period Weeks    Status New    Target Date 08/29/21      OT LONG TERM GOAL #4   Title Right wrist extension and flexion improve within normal limits to push and pull door open use walker and wipe table    Baseline Right wrist extension 52 , flexion 70 with pain over dorsal wrist 10/10    Time 7    Period Weeks    Status New    Target Date 09/05/21      OT LONG TERM GOAL #5   Title Bilateral grip strength improved within functional limits for patient's age to be able to grip utensils and perform ADLs with pain less than 2/    Baseline Not tested patient with limited range of motion flexion extension, increase edema and stiffness and pain 10/10 in the right hand, left hand 4-6/10 pain in palm and PIPs    Time 8    Period Weeks    Status New    Target Date 09/12/21                   Plan - 08/19/21 2993     Clinical Impression Statement Patient presented OT evaluation with a diagnosis of bilateral hand pain.  Patient reports she some had falls with  the last 6 months. Had many falls last year per patient.  Patient reports she had a fracture on the right ulnar side of hand New Year's.  Was in a cast and then removed and allowed to continue with range of motion and functional use.  Patient arrived with increased edema and pain in right hand with decreased extension and flexion of digits in the right hand with fifth and fourth the worse.  Pain and tenderness 10/10 at start of episode of care.  As well as decreased wrist flexion extension with increased pain over dorsal wrist.  Left hand patient report increased discomfort  and pain 2/10 in the palm with opening hand as well as PIP joints 5/10 pain.   Most of her limitation continues in fourth and fifth PIP endrange extension -10 to -15 degrees as well as flexion and composite and intrinsic.  Performed contrast with soft tissue massage patient's pain on volar PIP improving.  Reinforced with patient to not do tight and forceful grips but loose grips when holding onto a cylinder objects. Pt had one good day last week in which she had no pain at rest and minimal pain with movement, however both sessions this week pain has increased again but trending down over the week.  She still has limited access to perform contrast at home with living in hotel setting.  Issued additional red foam this week for utensils, pens.  She reports she used to have some built up utensils but they were taken from her.  Gross grasp and release of a variety of objects,  pain around a 5/10 in digits today.  Patient limited in functional use of bilateral hand with the right hand the worst and her dominant hand perform ADLs and IADLs.  Patient can benefit from skilled OT services to maximize safety and independence in necessary daily tasks.    OT Occupational Profile and History Problem Focused Assessment - Including review of records relating to presenting problem    Occupational performance deficits (Please refer to evaluation for details): ADL's;IADL's;Rest and Sleep;Leisure;Play;Social Participation    Body Structure / Function / Physical Skills ADL;Strength;Pain;Edema;UE functional use;IADL;ROM;Flexibility;Decreased knowledge of precautions    Rehab Potential Fair    Clinical Decision Making Several treatment options, min-mod task modification necessary    Comorbidities Affecting Occupational Performance: None    Modification or Assistance to Complete Evaluation  No modification of tasks or assist necessary to complete eval    OT Frequency 2x / week    OT Duration 8 weeks    OT Treatment/Interventions  Self-care/ADL training;Ultrasound;Contrast Bath;DME and/or AE instruction;Splinting;Manual Therapy;Therapeutic activities;Patient/family education    Consulted and Agree with Plan of Care Patient             Patient will benefit from skilled therapeutic intervention in order to improve the following deficits and impairments:   Body Structure / Function / Physical Skills: ADL, Strength, Pain, Edema, UE functional use, IADL, ROM, Flexibility, Decreased knowledge of precautions       Visit Diagnosis: Pain in both hands  Muscle weakness (generalized)  Pain in right wrist  Stiffness of right wrist, not elsewhere classified  Stiffness of right hand, not elsewhere classified    Problem List Patient Active Problem List   Diagnosis Date Noted   Mixed hyperlipidemia 07/02/2019   Hypothyroidism due to acquired atrophy of thyroid 04/29/2019   Osteopenia determined by x-ray 04/29/2019   Acquired claw toe of both feet 10/23/2018   Dermatitis 10/22/2018   Mixed stress and urge urinary incontinence 08/08/2018   Magnesium deficiency 08/08/2018   Aortic atherosclerosis ([place]) 05/31/2018   Achilles tendon disorder 04/25/2018   Insomnia 14/97/0263   Diastolic dysfunction 78/58/8502   Mitral regurgitation 03/29/2018   Personality disorder ([place]) 12/25/2017   Arthritis of hand, degenerative 12/07/2017   Bipolar I disorder, most recent episode mixed, severe with psychotic features ([place]) 09/18/2017   Hallux valgus, acquired 07/05/2017   Hammer toes of both feet 07/05/2017   Unstable ankle 07/05/2017   Chronic bilateral low back pain without sciatica 06/09/2017   Tinea unguium 05/08/2017   Conductive hearing loss of right ear with unrestricted hearing of left ear 04/03/2017   Adjustment disorder with depressed mood 01/18/2017   Migraine 01/18/2017   Irritable bowel syndrome with constipation 11/17/2016   Bilateral carpal tunnel syndrome 11/17/2016   Plantar fasciitis, bilateral  11/17/2016   Cervical myofascial pain syndrome 11/17/2016   Chronic hyponatremia 11/17/2016   PTSD (post-traumatic stress disorder) 11/17/2016   Hiatal hernia 11/17/2016   Vitamin D deficiency 11/17/2016   Vitamin B12 deficiency 11/17/2016   Cervical spondylosis with myelopathy 05/24/2016   MCI (mild cognitive impairment) with memory loss 03/30/2016   Gait abnormality 03/30/2016   Chronic bipolar affective disorder ([place]) 01/24/2016   Recurrent major depressive disorder, in partial remission ([place]) 01/24/2016   Anemia, iron deficiency 06/24/2015   Benign essential HTN 06/24/2015   History of falling 05/22/2015   History of TIA (transient ischemic attack) 04/20/2015   Gastroesophageal reflux disease with hiatal hernia 04/20/2015   Cervical disc disorder at C5-C6 level with radiculopathy 03/25/2015  Chronic neck pain 03/25/2015   Pelvic pain in female 10/30/2014   History of skin cancer 06/30/2014   Chronic headaches 12/16/2013   Neuropathy 12/16/2013   Amiir Heckard T Alicianna Litchford, OTR/L, CLT  Brodrick Curran, OT 08/19/2021, 8:45 AM  [facility] [address], [place], [address] Phone: [phone]   Fax:  [phone]  Name: Angela [name] MRN: [id number] Date of Birth: [birth year]

## 2021-08-23 ENCOUNTER — Ambulatory Visit: Payer: Medicare Other | Admitting: Physical Therapy

## 2021-08-23 ENCOUNTER — Ambulatory Visit: Payer: Medicare Other | Admitting: Occupational Therapy

## 2021-08-23 ENCOUNTER — Encounter: Payer: Medicare Other | Admitting: Physical Therapy

## 2021-08-23 DIAGNOSIS — B359 Dermatophytosis, unspecified: Secondary | ICD-10-CM | POA: Diagnosis not present

## 2021-08-25 ENCOUNTER — Telehealth: Payer: Self-pay | Admitting: Physical Therapy

## 2021-08-25 ENCOUNTER — Ambulatory Visit: Payer: Medicare Other | Admitting: Occupational Therapy

## 2021-08-25 ENCOUNTER — Ambulatory Visit: Payer: Medicare Other | Admitting: Physical Therapy

## 2021-08-25 NOTE — Telephone Encounter (Signed)
Called pt to inquiry about absence from PT. Pt reports still feeling sick and that she would not be able to make it in. She also mentions having trouble with housing which she is trying to sort out.

## 2021-08-30 ENCOUNTER — Ambulatory Visit: Payer: Medicare Other | Admitting: Occupational Therapy

## 2021-08-30 ENCOUNTER — Ambulatory Visit: Payer: Medicare Other | Admitting: Physical Therapy

## 2021-08-30 ENCOUNTER — Telehealth: Payer: Self-pay | Admitting: Physical Therapy

## 2021-08-30 DIAGNOSIS — M25531 Pain in right wrist: Secondary | ICD-10-CM

## 2021-08-30 DIAGNOSIS — M25631 Stiffness of right wrist, not elsewhere classified: Secondary | ICD-10-CM

## 2021-08-30 DIAGNOSIS — M79641 Pain in right hand: Secondary | ICD-10-CM

## 2021-08-30 DIAGNOSIS — M542 Cervicalgia: Secondary | ICD-10-CM | POA: Diagnosis not present

## 2021-08-30 DIAGNOSIS — R296 Repeated falls: Secondary | ICD-10-CM | POA: Diagnosis not present

## 2021-08-30 DIAGNOSIS — M25641 Stiffness of right hand, not elsewhere classified: Secondary | ICD-10-CM | POA: Diagnosis not present

## 2021-08-30 DIAGNOSIS — M6281 Muscle weakness (generalized): Secondary | ICD-10-CM

## 2021-08-30 DIAGNOSIS — M79642 Pain in left hand: Secondary | ICD-10-CM | POA: Diagnosis not present

## 2021-08-30 NOTE — Therapy (Signed)
[facility] [address], [place], [address] Phone: [phone]   Fax:  [phone]  Occupational Therapy Treatment  Patient Details  Name: Angela [name] MRN: [id number] Date of Birth: 1950/06/11 Referring Provider (OT): Kristen Cardinal   Encounter Date: 08/30/2021   OT End of Session - 08/30/21 1536     Visit Number 8    Number of Visits 12    Date for OT Re-Evaluation 09/12/21    OT Start Time 1531    OT Stop Time 1615    OT Time Calculation (min) 44 min    Activity Tolerance Patient tolerated treatment well    Behavior During Therapy [facility] for tasks assessed/performed             Past Medical History:  Diagnosis Date   Agitation 09/18/2017   Allergic rhinitis    Altered mental status, unspecified 12/25/2018   Anemia    Blurred vision    Chest pain of uncertain etiology 3/81/0175   Confusion state 12/25/2018   COPD (chronic obstructive pulmonary disease) ([facility])    Depression    Diverticulosis    Endometriosis    Falls    GERD (gastroesophageal reflux disease)    Hernia, hiatal    Hypothyroidism    IBS (irritable bowel syndrome)    Labile mood    Malignant neoplasm of skin    Malnutrition of mild degree ([place]) 08/08/2018   Migraine    Neuropathy    Noncompliance 12/25/2017   PTSD (post-traumatic stress disorder)    SIADH (syndrome of inappropriate ADH production) ([place]) 09/08/2016   Thyroid disease     Past Surgical History:  Procedure Laterality Date   APPENDECTOMY     CHOLECYSTECTOMY     CHOLECYSTECTOMY, LAPAROSCOPIC     ESOPHAGOGASTRODUODENOSCOPY (EGD) WITH PROPOFOL N/A 03/29/2015   Procedure: ESOPHAGOGASTRODUODENOSCOPY (EGD) WITH PROPOFOL;  Surgeon: Josefine Class, MD;  Location: [facility] ENDOSCOPY;  Service: Endoscopy;  Laterality: N/A;   HERNIA REPAIR     laproscopy     TONSILLECTOMY     uteral suspension      There were no vitals filed for this visit.   Subjective  Assessment - 08/30/21 1535     Subjective  I need to be out of the hotel tomorrow morning- I am sorry did not see you last week but was congested and did not feel good- but I am massaging my hand    Pertinent History Patient was seen years ago by this OT for bilateral carpal tunnel symptoms.  As well as hand pain.  Patient returned this date with reports of fracture to right fifth digit.  Unable to make a fist pain or dorsal hand as well as hand and volar forearm or wrist.  10/10 pain reported tenderness as well as swelling.  Patient referred to OT for bilateral hand pain.    Patient Stated Goals I want the pain better my right hand so that I can use it as my hand that I need to cut with, grip with, Write with, use my walker and bathe and dress    Pain Score 4     Pain Location Hand                [facility] OT Assessment - 08/30/21 0001       Strength   Right Hand Grip (lbs) 31    Right Hand Lateral Pinch 15 lbs    Right Hand 3 Point Pinch 11  lbs      Right Hand AROM   R Ring  MCP 0-90 90 Degrees    R Ring PIP 0-100 100 Degrees    R Little  MCP 0-90 90 Degrees   0   R Little PIP 0-100 95 Degrees   -15                     OT Treatments/Exercises (OP) - 08/30/21 0001       RUE Fluidotherapy   Number Minutes Fluidotherapy 8 Minutes    RUE Fluidotherapy Location Hand;Wrist    Comments decrease pain and stiffness      LUE Fluidotherapy   Number Minutes Fluidotherapy 8 Minutes    LUE Fluidotherapy Location Hand;Wrist    Comments decrease pain and stiffness            Pt reports she felt so bad last week and had to cancel her appointments Rationale for Evaluation and Treatment Rehabilitation   Measurements was taken see flowsheet show increase flexion at fourth and fifth digits with less pain.  Tenderness over volar fourth and fifth PIP improved.  Grip and three-point pinch improved.  Done fluidotherapy today to improve ROM and decrease pain.  Performed prior to  manual therapy.    Manual Therapy:  Pt seen for soft tissue massage to right and left hands , digits, metacarpal and carpal spreads, use of Graston #2 with  brushing over palm.      Therapeutic Exercise:   Tendon gliding exercises with therapist demo and cues for proper form and technique.  AAROM to digits for flexion/extension Reviewed with patient to do light PIP extension the fourth and fifth on table.  Patient tolerated soft tissue massage as well as range of motion better with gloves on..    Pt reports she is using red foam built up foam at home on hand tools/utensil/pen for increased grip surface.     Pt to continue with massage as a part of her home program as well as exercises for ROM.  Continue to recommend contrast for edema management although pt reports she is limited to assess for hot and cold based on living in a hotel currently. Pt is emotional today because she needs to be out of hotel tomorrow am.        OT Education - 08/30/21 1535     Education provided Yes    Education Details contrast, massage, exercises    Person(s) Educated Patient    Methods Explanation;Demonstration;Verbal cues;Handout    Comprehension Verbalized understanding;Returned demonstration                 OT Long Term Goals - 07/18/21 1728       OT LONG TERM GOAL #1   Title Patient to be independent in the home program to decrease pain and edema in the right hand to be able to improve flexion extension of digits.    Baseline Patient with increased edema and pain 10/10 in right hand with severe stiffness and digit flexion and extension.  As well as wrist decreased flexion extension    Time 3    Period Weeks    Status New    Target Date 08/08/21      OT LONG TERM GOAL #2   Title Right digit extension improved to within normal limits for patient to don glove and put hand in pocket with ease.    Baseline Right digit extension -15 to -25 at [facility] with hyperextension at  the [facility] some pain over  dorsal digits    Time 3    Period Weeks    Status New    Target Date 08/08/21      OT LONG TERM GOAL #3   Title Right digits flexion improved for patient to be able to make a composite fist touching palm with pain less than a 2/10 to use walker    Baseline Pain 10/10 in right hand with active range of motion.  MC's 80 degrees, PIPs 80-90.    Time 6    Period Weeks    Status New    Target Date 08/29/21      OT LONG TERM GOAL #4   Title Right wrist extension and flexion improve within normal limits to push and pull door open use walker and wipe table    Baseline Right wrist extension 52 , flexion 70 with pain over dorsal wrist 10/10    Time 7    Period Weeks    Status New    Target Date 09/05/21      OT LONG TERM GOAL #5   Title Bilateral grip strength improved within functional limits for patient's age to be able to grip utensils and perform ADLs with pain less than 2/    Baseline Not tested patient with limited range of motion flexion extension, increase edema and stiffness and pain 10/10 in the right hand, left hand 4-6/10 pain in palm and PIPs    Time 8    Period Weeks    Status New    Target Date 09/12/21                   Plan - 08/30/21 1823     OT Occupational Profile and History Problem Focused Assessment - Including review of records relating to presenting problem    Occupational performance deficits (Please refer to evaluation for details): ADL's;IADL's;Rest and Sleep;Leisure;Play;Social Participation    Body Structure / Function / Physical Skills ADL;Strength;Pain;Edema;UE functional use;IADL;ROM;Flexibility;Decreased knowledge of precautions    Rehab Potential Fair    Clinical Decision Making Several treatment options, min-mod task modification necessary    Comorbidities Affecting Occupational Performance: None    Modification or Assistance to Complete Evaluation  No modification of tasks or assist necessary to complete eval    OT Frequency 2x / week     OT Duration 8 weeks    OT Treatment/Interventions Self-care/ADL training;Ultrasound;Contrast Bath;DME and/or AE instruction;Splinting;Manual Therapy;Therapeutic activities;Patient/family education    Consulted and Agree with Plan of Care Patient             Patient will benefit from skilled therapeutic intervention in order to improve the following deficits and impairments:   Body Structure / Function / Physical Skills: ADL, Strength, Pain, Edema, UE functional use, IADL, ROM, Flexibility, Decreased knowledge of precautions       Visit Diagnosis: Pain in both hands  Muscle weakness (generalized)  Pain in right wrist  Stiffness of right wrist, not elsewhere classified    Problem List Patient Active Problem List   Diagnosis Date Noted   Mixed hyperlipidemia 07/02/2019   Hypothyroidism due to acquired atrophy of thyroid 04/29/2019   Osteopenia determined by x-ray 04/29/2019   Acquired claw toe of both feet 10/23/2018   Dermatitis 10/22/2018   Mixed stress and urge urinary incontinence 08/08/2018   Magnesium deficiency 08/08/2018   Aortic atherosclerosis ([place]) 05/31/2018   Achilles tendon disorder 04/25/2018   Insomnia 09/47/0962   Diastolic dysfunction 83/66/2947   Mitral  regurgitation 03/29/2018   Personality disorder ([place]) 12/25/2017   Arthritis of hand, degenerative 12/07/2017   Bipolar I disorder, most recent episode mixed, severe with psychotic features ([place]) 09/18/2017   Hallux valgus, acquired 07/05/2017   Hammer toes of both feet 07/05/2017   Unstable ankle 07/05/2017   Chronic bilateral low back pain without sciatica 06/09/2017   Tinea unguium 05/08/2017   Conductive hearing loss of right ear with unrestricted hearing of left ear 04/03/2017   Adjustment disorder with depressed mood 01/18/2017   Migraine 01/18/2017   Irritable bowel syndrome with constipation 11/17/2016   Bilateral carpal tunnel syndrome 11/17/2016   Plantar fasciitis, bilateral 11/17/2016    Cervical myofascial pain syndrome 11/17/2016   Chronic hyponatremia 11/17/2016   PTSD (post-traumatic stress disorder) 11/17/2016   Hiatal hernia 11/17/2016   Vitamin D deficiency 11/17/2016   Vitamin B12 deficiency 11/17/2016   Cervical spondylosis with myelopathy 05/24/2016   MCI (mild cognitive impairment) with memory loss 03/30/2016   Gait abnormality 03/30/2016   Chronic bipolar affective disorder ([place]) 01/24/2016   Recurrent major depressive disorder, in partial remission ([place]) 01/24/2016   Anemia, iron deficiency 06/24/2015   Benign essential HTN 06/24/2015   History of falling 05/22/2015   History of TIA (transient ischemic attack) 04/20/2015   Gastroesophageal reflux disease with hiatal hernia 04/20/2015   Cervical disc disorder at C5-C6 level with radiculopathy 03/25/2015   Chronic neck pain 03/25/2015   Pelvic pain in female 10/30/2014   History of skin cancer 06/30/2014   Chronic headaches 12/16/2013   Neuropathy 12/16/2013    Rosalyn Gess, OTR/L,CLT 08/30/2021, 6:31 PM  [facility] [address], [place], [address] Phone: [phone]   Fax:  [phone]  Name: Angela [name] MRN: [id number] Date of Birth: [birth year]

## 2021-08-30 NOTE — Telephone Encounter (Signed)
Called pt to inquiry about absence. Pt reports that she has continued sickness so she would be unable to make it.

## 2021-08-31 DIAGNOSIS — S9001XA Contusion of right ankle, initial encounter: Secondary | ICD-10-CM | POA: Diagnosis not present

## 2021-08-31 DIAGNOSIS — M25471 Effusion, right ankle: Secondary | ICD-10-CM | POA: Diagnosis not present

## 2021-08-31 DIAGNOSIS — M25571 Pain in right ankle and joints of right foot: Secondary | ICD-10-CM | POA: Diagnosis not present

## 2021-09-01 ENCOUNTER — Ambulatory Visit: Payer: Medicare Other | Admitting: Occupational Therapy

## 2021-09-01 DIAGNOSIS — M25531 Pain in right wrist: Secondary | ICD-10-CM | POA: Diagnosis not present

## 2021-09-01 DIAGNOSIS — M6281 Muscle weakness (generalized): Secondary | ICD-10-CM

## 2021-09-01 DIAGNOSIS — M79641 Pain in right hand: Secondary | ICD-10-CM

## 2021-09-01 DIAGNOSIS — M542 Cervicalgia: Secondary | ICD-10-CM | POA: Diagnosis not present

## 2021-09-01 DIAGNOSIS — M79642 Pain in left hand: Secondary | ICD-10-CM | POA: Diagnosis not present

## 2021-09-01 DIAGNOSIS — R296 Repeated falls: Secondary | ICD-10-CM | POA: Diagnosis not present

## 2021-09-01 DIAGNOSIS — M25641 Stiffness of right hand, not elsewhere classified: Secondary | ICD-10-CM | POA: Diagnosis not present

## 2021-09-01 DIAGNOSIS — M25631 Stiffness of right wrist, not elsewhere classified: Secondary | ICD-10-CM

## 2021-09-01 NOTE — Therapy (Signed)
[facility] [address], [place], [address] Phone: [phone]   Fax:  [phone]  Occupational Therapy Treatment  Patient Details  Name: Angela [name] MRN: [id number] Date of Birth: December 22, 1950 Referring Provider (OT): Kristen Cardinal   Encounter Date: 09/01/2021   OT End of Session - 09/01/21 1403     Visit Number 9    Number of Visits 12    Date for OT Re-Evaluation 09/12/21    OT Start Time 1403    OT Stop Time 1445    OT Time Calculation (min) 42 min    Activity Tolerance Patient tolerated treatment well    Behavior During Therapy [facility] for tasks assessed/performed             Past Medical History:  Diagnosis Date   Agitation 09/18/2017   Allergic rhinitis    Altered mental status, unspecified 12/25/2018   Anemia    Blurred vision    Chest pain of uncertain etiology 6/80/3212   Confusion state 12/25/2018   COPD (chronic obstructive pulmonary disease) ([place])    Depression    Diverticulosis    Endometriosis    Falls    GERD (gastroesophageal reflux disease)    Hernia, hiatal    Hypothyroidism    IBS (irritable bowel syndrome)    Labile mood    Malignant neoplasm of skin    Malnutrition of mild degree ([place]) 08/08/2018   Migraine    Neuropathy    Noncompliance 12/25/2017   PTSD (post-traumatic stress disorder)    SIADH (syndrome of inappropriate ADH production) ([place]) 09/08/2016   Thyroid disease     Past Surgical History:  Procedure Laterality Date   APPENDECTOMY     CHOLECYSTECTOMY     CHOLECYSTECTOMY, LAPAROSCOPIC     ESOPHAGOGASTRODUODENOSCOPY (EGD) WITH PROPOFOL N/A 03/29/2015   Procedure: ESOPHAGOGASTRODUODENOSCOPY (EGD) WITH PROPOFOL;  Surgeon: Josefine Class, MD;  Location: [facility] ENDOSCOPY;  Service: Endoscopy;  Laterality: N/A;   HERNIA REPAIR     laproscopy     TONSILLECTOMY     uteral suspension      There were no vitals filed for this visit.   Subjective  Assessment - 09/01/21 1609     Subjective  I left message for my trust - I had to move out of the hotel and is homeless and then I have this boot on my foot -I have a contusion in my foot ankle- and now I have to push thru my hand on walker more and I had to pack everything up.  I need to be somewhere in the rehab    Pertinent History Patient was seen years ago by this OT for bilateral carpal tunnel symptoms.  As well as hand pain.  Patient returned this date with reports of fracture to right fifth digit.  Unable to make a fist pain or dorsal hand as well as hand and volar forearm or wrist.  10/10 pain reported tenderness as well as swelling.  Patient referred to OT for bilateral hand pain.    Patient Stated Goals I want the pain better my right hand so that I can use it as my hand that I need to cut with, grip with, Write with, use my walker and bathe and dress    Currently in Pain? Yes    Pain Score 9     Pain Location Hand    Pain Orientation Right;Left    Pain Descriptors / Indicators Aching;Tightness;Tender  Pain Type Acute pain;Chronic pain    Pain Onset More than a month ago    Pain Frequency Constant                          OT Treatments/Exercises (OP) - 09/01/21 0001       RUE Fluidotherapy   Number Minutes Fluidotherapy 11 Minutes    RUE Fluidotherapy Location Hand;Wrist    Comments 2 ice rotationdecrease pain and decrease edema      LUE Fluidotherapy   Number Minutes Fluidotherapy 11 Minutes    LUE Fluidotherapy Location Hand;Wrist    Comments 2 ice rotation decrease pain and increase ROM              Pt reports she felt so bad last week and had to cancel her appointments Rationale for Evaluation and Treatment Rehabilitation   Measurements last time taken showed progress flexion at fourth and fifth digits with less pain.  Tenderness over volar fourth and fifth PIP improved.  Grip and three-point pinch improved.  But this date patient present at  session today with increased swelling and pain again in the right fourth and fifth and hand.  Patient reports she since last time is not her hotel anymore, she has a contusion in the right foot-boot in place.  And had to back up everything and that increases her pain as well as more heavy use of her walker. At start of care patient was on the phone with her trust leaving a message about help for housing and medical care.  Done fluidotherapy today with contrast in between to decrease swelling and pain -and increased motion.  Performed prior to manual therapy.    Manual Therapy:  Pt seen for soft tissue massage to right and left hands , digits, metacarpal and carpal spreads, use of [place] #2 with  brushing over palm.   Patient more tender this date.  Recommend for patient to look for her gloves that she says is in her dirty laundry.  Therapeutic Exercise:   Tendon gliding exercises with therapist demo and cues for proper form and technique.  AAROM to digits for flexion/extension in pain-free range Reviewed with patient to do light PIP extension the fourth and fifth on table.     Pt reports she is using red foam built up foam at home on hand tools/utensil/pen for increased grip surface.     Pt to continue with massage as a part of her home program as well as exercises for ROM.  Continue to recommend contrast for edema management although pt reports she is limited to assess for hot and cold based on living situation.      OT Education - 09/01/21 1403     Education provided Yes    Education Details contrast, massage, exercises    Person(s) Educated Patient    Methods Explanation;Demonstration;Verbal cues;Handout    Comprehension Verbalized understanding;Returned demonstration                 OT Long Term Goals - 07/18/21 1728       OT LONG TERM GOAL #1   Title Patient to be independent in the home program to decrease pain and edema in the right hand to be able to improve flexion  extension of digits.    Baseline Patient with increased edema and pain 10/10 in right hand with severe stiffness and digit flexion and extension.  As well as wrist decreased flexion extension  Time 3    Period Weeks    Status New    Target Date 08/08/21      OT LONG TERM GOAL #2   Title Right digit extension improved to within normal limits for patient to don glove and put hand in pocket with ease.    Baseline Right digit extension -15 to -25 at [facility] with hyperextension at [facility] some pain over dorsal digits    Time 3    Period Weeks    Status New    Target Date 08/08/21      OT LONG TERM GOAL #3   Title Right digits flexion improved for patient to be able to make a composite fist touching palm with pain less than a 2/10 to use walker    Baseline Pain 10/10 in right hand with active range of motion.  MC's 80 degrees, PIPs 80-90.    Time 6    Period Weeks    Status New    Target Date 08/29/21      OT LONG TERM GOAL #4   Title Right wrist extension and flexion improve within normal limits to push and pull door open use walker and wipe table    Baseline Right wrist extension 52 , flexion 70 with pain over dorsal wrist 10/10    Time 7    Period Weeks    Status New    Target Date 09/05/21      OT LONG TERM GOAL #5   Title Bilateral grip strength improved within functional limits for patient's age to be able to grip utensils and perform ADLs with pain less than 2/    Baseline Not tested patient with limited range of motion flexion extension, increase edema and stiffness and pain 10/10 in the right hand, left hand 4-6/10 pain in palm and PIPs    Time 8    Period Weeks    Status New    Target Date 09/12/21                   Plan - 09/01/21 1613     Clinical Impression Statement Patient presented OT evaluation with a diagnosis of bilateral hand pain.  Patient reports she some had falls with  the last 6 months. Had many falls last year per patient.  Patient reports she had  a fracture on the right ulnar side of hand New Year's.  Was in a cast and then removed and allowed to continue with range of motion and functional use.  Patient arrived with increased edema and pain in right hand with decreased extension and flexion of digits in the right hand with fifth and fourth the worse.  Pain and tenderness 10/10 at start of episode of care.  As well as decreased wrist flexion extension with increased pain over dorsal wrist.  Patient made progress the last sessions in active range of motion as well as pain and swelling in right fifth and fourth digits and palm.  But this date appear patient is not at hotel anymore and have to move - pain 9/10 and edema.  Appear patient also has a contusion to the leg and in a foot boot.  Reporting she has to push more down onto her walker as well as had to pack everything up in a hotel room.  Patient on the phone during session with trust for financial assistance for housing needs and medical care.. She still has limited access to perform contrast at home.  .  Patient  limited in functional use of bilateral hand with the right hand the worst and her dominant hand perform ADLs and IADLs.  Patient can benefit from skilled OT services to maximize safety and independence in necessary daily tasks.    OT Occupational Profile and History Problem Focused Assessment - Including review of records relating to presenting problem    Occupational performance deficits (Please refer to evaluation for details): ADL's;IADL's;Rest and Sleep;Leisure;Play;Social Participation    Body Structure / Function / Physical Skills ADL;Strength;Pain;Edema;UE functional use;IADL;ROM;Flexibility;Decreased knowledge of precautions    Rehab Potential Fair    Clinical Decision Making Several treatment options, min-mod task modification necessary    Comorbidities Affecting Occupational Performance: None    Modification or Assistance to Complete Evaluation  No modification of tasks or assist  necessary to complete eval    OT Frequency 2x / week    OT Duration 4 weeks    OT Treatment/Interventions Self-care/ADL training;Ultrasound;Contrast Bath;DME and/or AE instruction;Splinting;Manual Therapy;Therapeutic activities;Patient/family education    Consulted and Agree with Plan of Care Patient             Patient will benefit from skilled therapeutic intervention in order to improve the following deficits and impairments:   Body Structure / Function / Physical Skills: ADL, Strength, Pain, Edema, UE functional use, IADL, ROM, Flexibility, Decreased knowledge of precautions       Visit Diagnosis: Pain in both hands  Muscle weakness (generalized)  Pain in right wrist  Stiffness of right wrist, not elsewhere classified    Problem List Patient Active Problem List   Diagnosis Date Noted   Mixed hyperlipidemia 07/02/2019   Hypothyroidism due to acquired atrophy of thyroid 04/29/2019   Osteopenia determined by x-ray 04/29/2019   Acquired claw toe of both feet 10/23/2018   Dermatitis 10/22/2018   Mixed stress and urge urinary incontinence 08/08/2018   Magnesium deficiency 08/08/2018   Aortic atherosclerosis ([place]) 05/31/2018   Achilles tendon disorder 04/25/2018   Insomnia 42/70/6237   Diastolic dysfunction 62/83/1517   Mitral regurgitation 03/29/2018   Personality disorder ([place]) 12/25/2017   Arthritis of hand, degenerative 12/07/2017   Bipolar I disorder, most recent episode mixed, severe with psychotic features ([place]) 09/18/2017   Hallux valgus, acquired 07/05/2017   Hammer toes of both feet 07/05/2017   Unstable ankle 07/05/2017   Chronic bilateral low back pain without sciatica 06/09/2017   Tinea unguium 05/08/2017   Conductive hearing loss of right ear with unrestricted hearing of left ear 04/03/2017   Adjustment disorder with depressed mood 01/18/2017   Migraine 01/18/2017   Irritable bowel syndrome with constipation 11/17/2016   Bilateral carpal tunnel  syndrome 11/17/2016   Plantar fasciitis, bilateral 11/17/2016   Cervical myofascial pain syndrome 11/17/2016   Chronic hyponatremia 11/17/2016   PTSD (post-traumatic stress disorder) 11/17/2016   Hiatal hernia 11/17/2016   Vitamin D deficiency 11/17/2016   Vitamin B12 deficiency 11/17/2016   Cervical spondylosis with myelopathy 05/24/2016   MCI (mild cognitive impairment) with memory loss 03/30/2016   Gait abnormality 03/30/2016   Chronic bipolar affective disorder ([place]) 01/24/2016   Recurrent major depressive disorder, in partial remission ([place]) 01/24/2016   Anemia, iron deficiency 06/24/2015   Benign essential HTN 06/24/2015   History of falling 05/22/2015   History of TIA (transient ischemic attack) 04/20/2015   Gastroesophageal reflux disease with hiatal hernia 04/20/2015   Cervical disc disorder at C5-C6 level with radiculopathy 03/25/2015   Chronic neck pain 03/25/2015   Pelvic pain in female 10/30/2014   History of skin  cancer 06/30/2014   Chronic headaches 12/16/2013   Neuropathy 12/16/2013    Rosalyn Gess, OTR/L,CLT 09/01/2021, 4:19 PM  [facility] [address] [place], [address] Phone: [phone]   Fax:  [phone]  Name: Angela [name] MRN: [id number] Date of Birth: Jul 20, 1950

## 2021-09-05 DIAGNOSIS — M25571 Pain in right ankle and joints of right foot: Secondary | ICD-10-CM | POA: Diagnosis not present

## 2021-09-06 ENCOUNTER — Ambulatory Visit: Payer: Medicare Other | Admitting: Occupational Therapy

## 2021-09-06 ENCOUNTER — Ambulatory Visit: Payer: Medicare Other | Admitting: Physical Therapy

## 2021-09-08 ENCOUNTER — Ambulatory Visit: Payer: Medicare Other | Admitting: Occupational Therapy

## 2021-09-09 DIAGNOSIS — Z85828 Personal history of other malignant neoplasm of skin: Secondary | ICD-10-CM | POA: Diagnosis not present

## 2021-09-09 DIAGNOSIS — Z91010 Allergy to peanuts: Secondary | ICD-10-CM | POA: Diagnosis not present

## 2021-09-09 DIAGNOSIS — R45851 Suicidal ideations: Secondary | ICD-10-CM | POA: Insufficient documentation

## 2021-09-09 DIAGNOSIS — Z9089 Acquired absence of other organs: Secondary | ICD-10-CM | POA: Insufficient documentation

## 2021-09-09 DIAGNOSIS — Z8673 Personal history of transient ischemic attack (TIA), and cerebral infarction without residual deficits: Secondary | ICD-10-CM | POA: Diagnosis not present

## 2021-09-09 DIAGNOSIS — F430 Acute stress reaction: Secondary | ICD-10-CM | POA: Diagnosis not present

## 2021-09-09 DIAGNOSIS — Z5900 Homelessness unspecified: Secondary | ICD-10-CM | POA: Insufficient documentation

## 2021-09-09 DIAGNOSIS — J449 Chronic obstructive pulmonary disease, unspecified: Secondary | ICD-10-CM | POA: Insufficient documentation

## 2021-09-09 DIAGNOSIS — F3132 Bipolar disorder, current episode depressed, moderate: Secondary | ICD-10-CM | POA: Diagnosis not present

## 2021-09-09 DIAGNOSIS — E039 Hypothyroidism, unspecified: Secondary | ICD-10-CM | POA: Insufficient documentation

## 2021-09-09 DIAGNOSIS — R4182 Altered mental status, unspecified: Secondary | ICD-10-CM | POA: Diagnosis present

## 2021-09-09 DIAGNOSIS — Z9049 Acquired absence of other specified parts of digestive tract: Secondary | ICD-10-CM | POA: Diagnosis not present

## 2021-09-09 DIAGNOSIS — Y900 Blood alcohol level of less than 20 mg/100 ml: Secondary | ICD-10-CM | POA: Diagnosis not present

## 2021-09-10 ENCOUNTER — Other Ambulatory Visit: Payer: Self-pay

## 2021-09-10 ENCOUNTER — Encounter: Payer: Self-pay | Admitting: Emergency Medicine

## 2021-09-10 ENCOUNTER — Emergency Department
Admission: EM | Admit: 2021-09-10 | Discharge: 2021-09-10 | Disposition: A | Discharge status: Home / Self Care | Payer: Medicare Other | Attending: Emergency Medicine | Admitting: Emergency Medicine

## 2021-09-10 DIAGNOSIS — Z5900 Homelessness unspecified: Secondary | ICD-10-CM

## 2021-09-10 DIAGNOSIS — F3132 Bipolar disorder, current episode depressed, moderate: Secondary | ICD-10-CM

## 2021-09-10 DIAGNOSIS — F430 Acute stress reaction: Secondary | ICD-10-CM | POA: Diagnosis present

## 2021-09-10 DIAGNOSIS — F609 Personality disorder, unspecified: Secondary | ICD-10-CM | POA: Diagnosis present

## 2021-09-10 LAB — COMPREHENSIVE METABOLIC PANEL
ALT: 19 U/L (ref 0–44)
AST: 24 U/L (ref 15–41)
Albumin: 4 g/dL (ref 3.5–5.0)
Alkaline Phosphatase: 90 U/L (ref 38–126)
Anion gap: 5 (ref 5–15)
BUN: 20 mg/dL (ref 8–23)
CO2: 25 mmol/L (ref 22–32)
Calcium: 9.3 mg/dL (ref 8.9–10.3)
Chloride: 106 mmol/L (ref 98–111)
Creatinine, Ser: 0.71 mg/dL (ref 0.44–1.00)
GFR, Estimated: >60 mL/min (ref >60)
Glucose, Bld: 85 mg/dL (ref 70–99)
Potassium: 4.1 mmol/L (ref 3.5–5.1)
Sodium: 136 mmol/L (ref 135–145)
Total Bilirubin: 0.7 mg/dL (ref 0.3–1.2)
Total Protein: 7.2 g/dL (ref 6.5–8.1)

## 2021-09-10 LAB — URINE DRUG SCREEN, QUALITATIVE (ARMC ONLY)
Amphetamines, Ur Screen: NOT DETECTED
Barbiturates, Ur Screen: NOT DETECTED
Benzodiazepine, Ur Scrn: NOT DETECTED
Cannabinoid 50 Ng, Ur ~~LOC~~: NOT DETECTED
Cocaine Metabolite,Ur ~~LOC~~: NOT DETECTED
MDMA (Ecstasy)Ur Screen: NOT DETECTED
Methadone Scn, Ur: NOT DETECTED
Opiate, Ur Screen: NOT DETECTED
Phencyclidine (PCP) Ur S: NOT DETECTED
Tricyclic, Ur Screen: NOT DETECTED

## 2021-09-10 LAB — CBC
HCT: 39 % (ref 36.0–46.0)
Hemoglobin: 12.8 g/dL (ref 12.0–15.0)
MCH: 29.5 pg (ref 26.0–34.0)
MCHC: 32.8 g/dL (ref 30.0–36.0)
MCV: 89.9 fL (ref 80.0–100.0)
Platelets: 277 10*3/uL (ref 150–400)
RBC: 4.34 MIL/uL (ref 3.87–5.11)
RDW: 14.2 % (ref 11.5–15.5)
WBC: 5.4 10*3/uL (ref 4.0–10.5)
nRBC: 0 % (ref 0.0–0.2)

## 2021-09-10 LAB — ETHANOL: Alcohol, Ethyl (B): <10 mg/dL (ref <10)

## 2021-09-10 LAB — SALICYLATE LEVEL: Salicylate Lvl: <7 mg/dL — ABNORMAL LOW (ref 7.0–30.0)

## 2021-09-10 LAB — ACETAMINOPHEN LEVEL: Acetaminophen (Tylenol), Serum: <10 ug/mL — ABNORMAL LOW (ref 10–30)

## 2021-09-10 MED ORDER — PEDIALYTE PO SOLN
1000.0000 mL | Freq: Once | ORAL | Status: AC
  Administered 2021-09-10: 1000 mL via ORAL

## 2021-09-10 NOTE — BH Assessment (Addendum)
Comprehensive Clinical Assessment (CCA) Note  09/10/2021 Angela [name] [id number]  Chief Complaint: Patient is 71 year old female presenting to [facility] ED voluntarily. Per triage note Pt reports she feels for her life, Pt reports she is homeless, and does not have food to eat. Reports SI denies any HI. Pt denies auditory or visual hallucinations. During assessment patient appears alert and oriented x4, calm and cooperative with some disorganized thoughts. Patient reports "I wish I was dead" and reports having some financial issus due to her brother giving her money in his will and reports that a "third party company is managing it" and causing her stress. Patient continues to report issues with the company and that they are "playing games with my money" as a result patient reports that she has not had access to food or her medications. Patient also reports that she has not slept in "3 years." Patient would often go on some tangents that were disorganized and making paranoid statements that people are out to get her. Patient continues to report some passive SI and denies HI/AH/VH  Per Psyc NP Anette Riedel patient is recommended for Inpatient Chief Complaint  Patient presents with   Psychiatric Evaluation   Visit Diagnosis: Bipolar affective disorder    CCA Screening, Triage and Referral (STR)  Patient Reported Information How did you hear about [place]? Self  Referral name: No data recorded Referral phone number: No data recorded  Whom do you see for routine medical problems? No data recorded Practice/Facility Name: No data recorded Practice/Facility Phone Number: No data recorded Name of Contact: No data recorded Contact Number: No data recorded Contact Fax Number: No data recorded Prescriber Name: No data recorded Prescriber Address (if known): No data recorded  What Is the Reason for Your Visit/Call Today? Pt reports she feels for her life, Pt reports she is homeless, and does not  have food to eat. Reports SI denies any HI. Pt denies auditory or visual hallucinations.  How Long Has This Been Causing You Problems? > than 6 months  What Do You Feel Would Help You the Most Today? Treatment for Depression or other mood problem   Have You Recently Been in Any Inpatient Treatment (Hospital/Detox/Crisis Center/28-Day Program)? No data recorded Name/Location of Program/Hospital:No data recorded How Long Were You There? No data recorded When Were You Discharged? No data recorded  Have You Ever Received Services From [facility] Before? No data recorded Who Do You See at [facility]? No data recorded  Have You Recently Had Any Thoughts About Hurting Yourself? No  Are You Planning to Commit Suicide/Harm Yourself At This time? No   Have you Recently Had Thoughts About [place]? No  Explanation: No data recorded  Have You Used Any Alcohol or Drugs in the Past 24 Hours? No  How Long Ago Did You Use Drugs or Alcohol? No data recorded What Did You Use and How Much? No data recorded  Do You Currently Have a Therapist/Psychiatrist? No  Name of Therapist/Psychiatrist: No data recorded  Have You Been Recently Discharged From Any Office Practice or Programs? No  Explanation of Discharge From Practice/Program: No data recorded    CCA Screening Triage Referral Assessment Type of Contact: Face-to-Face  Is this Initial or Reassessment? No data recorded Date Telepsych consult ordered in [facility]:  No data recorded Time Telepsych consult ordered in [facility]:  No data recorded  Patient Reported Information Reviewed? No data recorded Patient Left Without Being Seen? No data recorded Reason for  Not Completing Assessment: No data recorded  Collateral Involvement: No data recorded  Does Patient Have a Chloride? No data recorded Name and Contact of Legal Guardian: No data recorded If Minor and Not Living with Parent(s), Who has Custody? No data  recorded Is CPS involved or ever been involved? Never  Is APS involved or ever been involved? Never   Patient Determined To Be At Risk for Harm To Self or Others Based on Review of Patient Reported Information or Presenting Complaint? No  Method: No data recorded Availability of Means: No data recorded Intent: No data recorded Notification Required: No data recorded Additional Information for Danger to Others Potential: No data recorded Additional Comments for Danger to Others Potential: No data recorded Are There Guns or Other Weapons in Your Home? No data recorded Types of Guns/Weapons: No data recorded Are These Weapons Safely Secured?                            No data recorded Who Could Verify You Are Able To Have These Secured: No data recorded Do You Have any Outstanding Charges, Pending Court Dates, Parole/Probation? No data recorded Contacted To Inform of Risk of Harm To Self or Others: No data recorded  [facility] of Assessment: [facility] ED   Does Patient Present under Involuntary Commitment? No  IVC Papers Initial File Date: No data recorded  [place] of Residence: [place]   Patient Currently Receiving the Following Services: No data recorded  Determination of Need: Emergent (2 hours)   Options For Referral: No data recorded    CCA Biopsychosocial Intake/Chief Complaint:  No data recorded Current Symptoms/Problems: No data recorded  Patient Reported Schizophrenia/Schizoaffective Diagnosis in Past: No   Strengths: Patient is able to communicte  Preferences: No data recorded Abilities: No data recorded  Type of Services Patient Feels are Needed: No data recorded  Initial Clinical Notes/Concerns: No data recorded  Mental Health Symptoms Depression:   Change in energy/activity; Difficulty Concentrating; Fatigue; Hopelessness; Worthlessness; Irritability   Duration of Depressive symptoms:  Greater than two weeks   Mania:   Racing thoughts   Anxiety:     Tension; Worrying   Psychosis:   Delusions   Duration of Psychotic symptoms:  Greater than six months   Trauma:   None   Obsessions:   None   Compulsions:   None   Inattention:   None   Hyperactivity/Impulsivity:   None   Oppositional/Defiant Behaviors:   None   Emotional Irregularity:   None   Other Mood/Personality Symptoms:  No data recorded   Mental Status Exam Appearance and self-care  Stature:   Average   Weight:   Average weight   Clothing:   Casual   Grooming:   Normal   Cosmetic use:   None   Posture/gait:   Normal   Motor activity:   Not Remarkable   Sensorium  Attention:   Normal   Concentration:   Normal   Orientation:   X5   Recall/memory:   Normal   Affect and Mood  Affect:   Appropriate   Mood:   Anxious   Relating  Eye contact:   Normal   Facial expression:   Responsive   Attitude toward examiner:   Cooperative   Thought and Language  Speech flow:  Clear and Coherent   Thought content:   Appropriate to Mood and Circumstances   Preoccupation:   None   Hallucinations:  None   Organization:  No data recorded  [employer] of Knowledge:   Fair   Intelligence:   Average   Abstraction:   Normal   Judgement:   Fair   Building surveyor   Insight:   Fair   Decision Making:   Normal   Social Functioning  Social Maturity:   Responsible   Social Judgement:   Normal   Stress  Stressors:   Housing; Teacher, music Ability:   Exhausted   Skill Deficits:   None   Supports:   Support needed     Religion: Religion/Spirituality Are You A Religious Person?: No  Leisure/Recreation: Leisure / Recreation Do You Have Hobbies?: No  Exercise/Diet: Exercise/Diet Do You Exercise?: No Have You Gained or Lost A Significant Amount of Weight in the Past Six Months?: No Do You Follow a Special Diet?: No Do You Have Any Trouble Sleeping?: No   CCA  Employment/Education Employment/Work Situation: Employment / Work Situation Employment Situation: On disability Why is Patient on Disability: Unknown How Long has Patient Been on Disability: Unknown Has Patient ever Been in the [employer]?: No  Education: Education Is Patient Currently Attending School?: No Did You Have An Individualized Education Program (IIEP): No Did You Have Any Difficulty At [employer]?: No Patient's Education Has Been Impacted by Current Illness: No   CCA Family/Childhood History Family and Relationship History: Family history Marital status: Single Does patient have children?: No  Childhood History:  Childhood History By whom was/is the patient raised?: Both parents Did patient suffer any verbal/emotional/physical/sexual abuse as a child?: No Did patient suffer from severe childhood neglect?: No Has patient ever been sexually abused/assaulted/raped as an adolescent or adult?: No Was the patient ever a victim of a crime or a disaster?: No Witnessed domestic violence?: No Has patient been affected by domestic violence as an adult?: No  Child/Adolescent Assessment:     CCA Substance Use Alcohol/Drug Use: Alcohol / Drug Use Pain Medications: See MAR Prescriptions: See MAR Over the Counter: See MAR History of alcohol / drug use?: No history of alcohol / drug abuse                         ASAM's:  Six Dimensions of Multidimensional Assessment  Dimension 1:  Acute Intoxication and/or Withdrawal Potential:      Dimension 2:  Biomedical Conditions and Complications:      Dimension 3:  Emotional, Behavioral, or Cognitive Conditions and Complications:     Dimension 4:  Readiness to Change:     Dimension 5:  Relapse, Continued use, or Continued Problem Potential:     Dimension 6:  Recovery/Living Environment:     ASAM Severity Score:    ASAM Recommended Level of Treatment:     Substance use Disorder (SUD)    Recommendations for  Services/Supports/Treatments:    DSM5 Diagnoses: Patient Active Problem List   Diagnosis Date Noted   Bipolar affective disorder, currently depressed, moderate ([place])    Homeless    Mixed hyperlipidemia 07/02/2019   Hypothyroidism due to acquired atrophy of thyroid 04/29/2019   Osteopenia determined by x-ray 04/29/2019   Acquired claw toe of both feet 10/23/2018   Dermatitis 10/22/2018   Mixed stress and urge urinary incontinence 08/08/2018   Magnesium deficiency 08/08/2018   Aortic atherosclerosis ([place]) 05/31/2018   Achilles tendon disorder 04/25/2018   Insomnia 23/30/0762   Diastolic dysfunction 26/33/3545   Mitral regurgitation 03/29/2018  Personality disorder ([place]) 12/25/2017   Arthritis of hand, degenerative 12/07/2017   Bipolar I disorder, most recent episode mixed, severe with psychotic features ([place]) 09/18/2017   Hallux valgus, acquired 07/05/2017   Hammer toes of both feet 07/05/2017   Unstable ankle 07/05/2017   Chronic bilateral low back pain without sciatica 06/09/2017   Tinea unguium 05/08/2017   Conductive hearing loss of right ear with unrestricted hearing of left ear 04/03/2017   Adjustment disorder with depressed mood 01/18/2017   Migraine 01/18/2017   Irritable bowel syndrome with constipation 11/17/2016   Bilateral carpal tunnel syndrome 11/17/2016   Plantar fasciitis, bilateral 11/17/2016   Cervical myofascial pain syndrome 11/17/2016   Chronic hyponatremia 11/17/2016   PTSD (post-traumatic stress disorder) 11/17/2016   Hiatal hernia 11/17/2016   Vitamin D deficiency 11/17/2016   Vitamin B12 deficiency 11/17/2016   Cervical spondylosis with myelopathy 05/24/2016   MCI (mild cognitive impairment) with memory loss 03/30/2016   Gait abnormality 03/30/2016   Chronic bipolar affective disorder ([place]) 01/24/2016   Recurrent major depressive disorder, in partial remission ([place]) 01/24/2016   Anemia, iron deficiency 06/24/2015   Benign essential HTN 06/24/2015    History of falling 05/22/2015   History of TIA (transient ischemic attack) 04/20/2015   Gastroesophageal reflux disease with hiatal hernia 04/20/2015   Cervical disc disorder at C5-C6 level with radiculopathy 03/25/2015   Chronic neck pain 03/25/2015   Pelvic pain in female 10/30/2014   History of skin cancer 06/30/2014   Chronic headaches 12/16/2013   Neuropathy 12/16/2013    Patient Centered Plan: Patient is on the following Treatment Plan(s):  Anxiety and Impulse Control   Referrals to Alternative Service(s): Referred to Alternative Service(s):   Place:   Date:   Time:    Referred to Alternative Service(s):   Place:   Date:   Time:    Referred to Alternative Service(s):   Place:   Date:   Time:    Referred to Alternative Service(s):   Place:   Date:   Time:      '@[facility]'$ @  [employer], LCAS-A

## 2021-09-10 NOTE — ED Notes (Signed)
Pt requesting her medications, med rec has been complete, sent Dr Cherylann Banas a secure chat to ask for orders for home meds

## 2021-09-10 NOTE — Consult Note (Signed)
[facility] Consult   Reason for Consult:  Psych evaluation  Referring Physician:  Dr. Beather Arbour  Patient Identification: Angela [name] MRN:  [id number] Principal Diagnosis: <principal problem not specified> Diagnosis:  Active Problems:   * No active hospital problems. *   Total Time spent with patient: 45 minutes  Subjective:   Angela [name] is a 71 y.o. female patient admitted with multiple medical concerns and passive SI.  HPI:  [place], 71 y.o., female patient seen  by TTS and this provider; chart reviewed and consulted with Dr. Beather Arbour on 09/10/21.  On evaluation Angela [name] reports that she wishes she was dead. She reports that her father died and left her some money but that her brother hired a "third" party to manage it. She states that the third party has been "playing games" with the money and causes her to be without medication and food.  She reports going days without food and medication.  She reports being "put" in a moldy apartment that causes her to fall a lot.  She then began to talk a lot about her families mental health condition. She began making paranoid statements such as "they are out to get me, and I havent slept in days".    During evaluation Angela [name] is laying on the hall stretcher; she is alert/oriented x 4; calm/cooperative; and mood congruent with affect.  Patient is speaking in a clear tone at moderate volume, and normal pace; with good eye contact. Her thought process is mostly coherent and relevant; but she goes off on a tangent that makes little to no sense.  There is no indication that she is currently responding to internal/external stimuli.  Some of her thought content  may be delusional but it is difficult to determine without speaking with collateral.  Patient endorse passive suicidal ideation.  Making statements such as "I wish I was dead".  She also displays paranoia during the  assessment.  Patient has remained calm throughout assessment and has answered questions appropriately.    Recommendations:  Geropsychiatry unit when medically cleared.  Past Psychiatric History: unknown  Risk to Self:   Risk to Others:   Prior Inpatient Therapy:   Prior Outpatient Therapy:    Past Medical History:  Past Medical History:  Diagnosis Date   Agitation 09/18/2017   Allergic rhinitis    Altered mental status, unspecified 12/25/2018   Anemia    Blurred vision    Chest pain of uncertain etiology 0/17/7939   Confusion state 12/25/2018   COPD (chronic obstructive pulmonary disease) (HCC)    Depression    Diverticulosis    Endometriosis    Falls    GERD (gastroesophageal reflux disease)    Hernia, hiatal    Hypothyroidism    IBS (irritable bowel syndrome)    Labile mood    Malignant neoplasm of skin    Malnutrition of mild degree ([facility]) 08/08/2018   Migraine    Neuropathy    Noncompliance 12/25/2017   PTSD (post-traumatic stress disorder)    SIADH (syndrome of inappropriate ADH production) ([facility]) 09/08/2016   Thyroid disease     Past Surgical History:  Procedure Laterality Date   APPENDECTOMY     CHOLECYSTECTOMY     CHOLECYSTECTOMY, LAPAROSCOPIC     ESOPHAGOGASTRODUODENOSCOPY (EGD) WITH PROPOFOL N/A 03/29/2015   Procedure: ESOPHAGOGASTRODUODENOSCOPY (EGD) WITH PROPOFOL;  Surgeon: Josefine Class, MD;  Location: [facility] ENDOSCOPY;  Service: Endoscopy;  Laterality: N/A;   HERNIA  REPAIR     laproscopy     TONSILLECTOMY     uteral suspension     Family History:  Family History  Problem Relation Age of Onset   Heart disease Father    Hearing loss Father    COPD Father    Depression Father    Stroke Father    Vision loss Father    Varicose Veins Father    Cancer Mother        lung   Depression Sister    Arthritis Sister    Arthritis Brother    Hernia Brother    Anxiety disorder Brother    Arthritis Brother    Hernia Brother    Anxiety disorder Brother     Urolithiasis Neg Hx    Kidney disease Neg Hx    Kidney cancer Neg Hx    Prostate cancer Neg Hx    Breast cancer Neg Hx    Family Psychiatric  History:  Social History:  Social History   Substance and Sexual Activity  Alcohol Use Yes   Comment: occ     Social History   Substance and Sexual Activity  Drug Use No    Social History   Socioeconomic History   Marital status: Single    Spouse name: Not on file   Number of children: 0   Years of education: 1.5 years of college   Highest education level: Some college, no degree  Occupational History   Occupation: Retired  Tobacco Use   Smoking status: Former    Types: Cigarettes    Quit date: 11/17/1976    Years since quitting: 44.8   Smokeless tobacco: Never  Vaping Use   Vaping Use: Never used  Substance and Sexual Activity   Alcohol use: Yes    Comment: occ   Drug use: No   Sexual activity: Not Currently  Other Topics Concern   Not on file  Social History Narrative   Lives at home alone.   Right-handed.   No daily caffeine use.   Social Determinants of Health   Financial Resource Strain: High Risk (06/19/2019)   Overall Financial Resource Strain (CARDIA)    Difficulty of Paying Living Expenses: Very hard  Food Insecurity: Food Insecurity Present (06/19/2019)   Hunger Vital Sign    Worried About Running Out of Food in the Last Year: Often true    Ran Out of Food in the Last Year: Often true  Transportation Needs: Unmet Transportation Needs (06/19/2019)   PRAPARE - Hydrologist (Medical): Yes    Lack of Transportation (Non-Medical): Yes  Physical Activity: Inactive (06/19/2019)   Exercise Vital Sign    Days of Exercise per Week: 0 days    Minutes of Exercise per Session: 0 min  Stress: Stress Concern Present (06/19/2019)   [place] of Stress : Very much  Social Connections: Unknown (06/19/2019)   Social  Connection and Isolation Panel [NHANES]    Frequency of Communication with Friends and Family: Never    Frequency of Social Gatherings with Friends and Family: Never    Attends Religious Services: More than 4 times per year    Active Member of [employer] or Organizations: No    Attends Archivist Meetings: Never    Marital Status: Patient refused   Additional Social History:    Allergies:   Allergies  Allergen Reactions   Amoxicillin-Pot Clavulanate Anaphylaxis and  Hives   Aspirin Hives   Bee Venom Hives    Spider bites cause hives Spider bites cause hives   Cephalexin Hives and Other (See Comments)    Other reaction(s): Unknown    Dicyclomine Itching   Fluticasone-Salmeterol Shortness Of Breath   Haloperidol Nausea And Vomiting, Anaphylaxis and Other (See Comments)   Molds & Smuts Shortness Of Breath and Itching    Eyes-itchy Eyes-itchy Eyes-itchy Eyes-itchy Eyes-itchy    Other Hives, Rash, Anaphylaxis, Itching and Swelling    Spider bites cause hives Hives/throat swelling Hives/throat swelling   Oxycodone-Acetaminophen Nausea And Vomiting, Other (See Comments) and Anxiety    hallucinations  hallucinations hallucinations hallucinations    Peanut Butter Flavor Anaphylaxis   Peanuts [Peanut Oil] Anaphylaxis and Hives   Penicillin G Anaphylaxis   Penicillins Hives and Anaphylaxis    Has patient had a PCN reaction causing immediate rash, facial/tongue/throat swelling, SOB or lightheadedness with hypotension: Unknown Has patient had a PCN reaction causing severe rash involving mucus membranes or skin necrosis: Unknown Has patient had a PCN reaction that required hospitalization: Unknown Has patient had a PCN reaction occurring within the last 10 years: Unknown If all of the above answers are "NO", then may proceed with Cephalosporin use. Has patient had a PCN reaction causing immediate rash, facial/tongue/throat swelling, SOB or lightheadedness with hypotension:  Unknown Has patient had a PCN reaction causing severe rash involving mucus membranes or skin necrosis: Unknown Has patient had a PCN reaction that required hospitalization: Unknown Has patient had a PCN reaction occurring within the last 10 years: Unknown If all of the above answers are "NO", then may proceed with Cephalosporin use. Has patient had a PCN reaction causing immediate rash, facial/tongue/throat swelling, SOB or lightheadedness with hypotension: Unknown Has patient had a PCN reaction causing severe rash involving mucus membranes or skin necrosis: Unknown Has patient had a PCN reaction that required hospitalization: Unknown Has patient had a PCN reaction occurring within the last 10 years: Unknown If all of the above answers are "NO", then may proceed with Cephalosporin use.   Propoxyphene Anaphylaxis and Hives    Hives/throat swelling  Hives/throat swelling Hives/throat swelling Hives/throat swelling Hives/throat swelling    Sulfa Antibiotics Anaphylaxis, Hives and Itching   Sulfacetamide Sodium Anaphylaxis, Hives and Itching   Tramadol Hives and Other (See Comments)    Other reaction(s): Unknown   Amikacin Nausea And Vomiting and Other (See Comments)   Bee Pollen Other (See Comments)    Other reaction(s): Unknown   Imipramine Hives    Reaction to Tofranil Reaction to Tofranil Reaction to Tofranil   Prednisone Nausea And Vomiting, Anxiety and Other (See Comments)    Crazy mood swings, strange thoughts. Tolerates depomedrol   Crazy mood swings, strange thoughts Other reaction(s): Other (See Comments) Crazy mood swings, strange thoughts. Tolerates depomedrol  Other reaction(s): Other (See Comments) Crazy mood swings, strange thoughts (pt does tolerate cortisone injections) Crazy mood swings, strange thoughts. Tolerates depomedrol  Crazy mood swings, strange thoughts   Sulfur Hives   Azithromycin Other (See Comments)    Itching Itching Itching Bloody stool and  high BP Bloody stool and high BP    Doxycycline Photosensitivity   Elemental Sulfur Hives   Fluticasone    Honey Bee Venom    Hymenoptera Venom Preparations Hives    Reaction to spider bites   Lactose     Other reaction(s): Unknown   Lactose Intolerance (Gi)    Peanut-Containing Drug Products    Phthalylsulfacetamide  Pregabalin Other (See Comments)   Risperidone Other (See Comments)    Serotonin syndrome Other reaction(s): Unknown Serotonin syndrome Serotonin syndrome Serotonin syndrome Serotonin syndrome    Sulfacetamide    Wound Dressing Adhesive    Adhesive [Tape] Hives and Rash   Albuterol Palpitations    Tolerates levalbuterol Tolerates levalbuterol Tolerates levalbuterol   Ciprofloxacin Other (See Comments)    tendonopathy   Hydroxyzine Itching and Anxiety    Vistaril per pt    Labs:  Results for orders placed or performed during the hospital encounter of 09/10/21 (from the past 48 hour(s))  Urine Drug Screen, Qualitative     Status: None   Collection Time: 09/10/21 12:50 AM  Result Value Ref Range   Tricyclic, Ur Screen NONE DETECTED NONE DETECTED   Amphetamines, Ur Screen NONE DETECTED NONE DETECTED   MDMA (Ecstasy)Ur Screen NONE DETECTED NONE DETECTED   Cocaine Metabolite,Ur [place] NONE DETECTED NONE DETECTED   Opiate, Ur Screen NONE DETECTED NONE DETECTED   Phencyclidine (PCP) Ur S NONE DETECTED NONE DETECTED   Cannabinoid 50 Ng, Ur [place] NONE DETECTED NONE DETECTED   Barbiturates, Ur Screen NONE DETECTED NONE DETECTED   Benzodiazepine, Ur Scrn NONE DETECTED NONE DETECTED   Methadone Scn, Ur NONE DETECTED NONE DETECTED    Comment: (NOTE) Tricyclics + metabolites, urine    Cutoff 1000 ng/mL Amphetamines + metabolites, urine  Cutoff 1000 ng/mL MDMA (Ecstasy), urine              Cutoff 500 ng/mL Cocaine Metabolite, urine          Cutoff 300 ng/mL Opiate + metabolites, urine        Cutoff 300 ng/mL Phencyclidine (PCP), urine         Cutoff 25  ng/mL Cannabinoid, urine                 Cutoff 50 ng/mL Barbiturates + metabolites, urine  Cutoff 200 ng/mL Benzodiazepine, urine              Cutoff 200 ng/mL Methadone, urine                   Cutoff 300 ng/mL  The urine drug screen provides only a preliminary, unconfirmed analytical test result and should not be used for non-medical purposes. Clinical consideration and professional judgment should be applied to any positive drug screen result due to possible interfering substances. A more specific alternate chemical method must be used in order to obtain a confirmed analytical result. Gas chromatography / mass spectrometry (GC/MS) is the preferred confirm atory method. Performed at [facility], [address] [place], [place] [address]   Comprehensive metabolic panel     Status: None   Collection Time: 09/10/21  1:11 AM  Result Value Ref Range   Sodium 136 135 - 145 mmol/L   Potassium 4.1 3.5 - 5.1 mmol/L   Chloride 106 98 - 111 mmol/L   CO2 25 22 - 32 mmol/L   Glucose, Bld 85 70 - 99 mg/dL    Comment: Glucose reference range applies only to samples taken after fasting for at least 8 hours.   BUN 20 8 - 23 mg/dL   Creatinine, Ser 0.71 0.44 - 1.00 mg/dL   Calcium 9.3 8.9 - 10.3 mg/dL   Total Protein 7.2 6.5 - 8.1 g/dL   Albumin 4.0 3.5 - 5.0 g/dL   AST 24 15 - 41 U/L   ALT 19 0 - 44 U/L   Alkaline Phosphatase  90 38 - 126 U/L   Total Bilirubin 0.7 0.3 - 1.2 mg/dL   GFR, Estimated >60 >60 mL/min    Comment: (NOTE) Calculated using the CKD-EPI Creatinine Equation (2021)    Anion gap 5 5 - 15    Comment: Performed at [facility], [address] [place], [place] [address]  Ethanol     Status: None   Collection Time: 09/10/21  1:11 AM  Result Value Ref Range   Alcohol, Ethyl (B) <10 <10 mg/dL    Comment: (NOTE) Lowest detectable limit for serum alcohol is 10 mg/dL.  For medical purposes only. Performed at [facility], [address] [place], [place] [address]   Salicylate level     Status: Abnormal   Collection Time: 09/10/21  1:11 AM  Result Value Ref Range   Salicylate Lvl <4.1 (L) 7.0 - 30.0 mg/dL    Comment: Performed at [facility], [address] [place], [place] [address]  Acetaminophen level     Status: Abnormal   Collection Time: 09/10/21  1:11 AM  Result Value Ref Range   Acetaminophen (Tylenol), Serum <10 (L) 10 - 30 ug/mL    Comment: (NOTE) Therapeutic concentrations vary significantly. A range of 10-30 ug/mL  may be an effective concentration for many patients. However, some  are best treated at concentrations outside of this range. Acetaminophen concentrations >150 ug/mL at 4 hours after ingestion  and >50 ug/mL at 12 hours after ingestion are often associated with  toxic reactions.  Performed at [facility], [address] [place], [place] [address]   cbc     Status: None   Collection Time: 09/10/21  1:11 AM  Result Value Ref Range   WBC 5.4 4.0 - 10.5 K/uL   RBC 4.34 3.87 - 5.11 MIL/uL   Hemoglobin 12.8 12.0 - 15.0 g/dL   HCT 39.0 36.0 - 46.0 %   MCV 89.9 80.0 - 100.0 fL   MCH 29.5 26.0 - 34.0 pg   MCHC 32.8 30.0 - 36.0 g/dL   RDW 14.2 11.5 - 15.5 %   Platelets 277 150 - 400 K/uL   nRBC 0.0 0.0 - 0.2 %    Comment: Performed at [facility], [address] [place],  [address]    Current Facility-Administered Medications  Medication Dose Route Frequency Provider Last Rate Last Admin   Pedialyte solution SOLN 1,000 mL  1,000 mL Oral Once Paulette Blanch, MD       Current Outpatient Medications  Medication Sig Dispense Refill   acetaminophen (TYLENOL) 650 MG CR tablet Take 650 mg by mouth every 8 (eight) hours as needed for pain.     Acetylcysteine (NAC) 500 MG CAPS Take 500 mg by mouth 2 (two) times a day.     albuterol (VENTOLIN HFA) 108 (90 Base) MCG/ACT inhaler albuterol sulfate HFA 90 mcg/actuation aerosol inhaler  INHALE 2 PUFFS BY MOUTH  EVERY 4 HOURS AS NEEDED     ascorbic acid (VITAMIN C) 1000 MG tablet Vitamin C 1,000 mg tablet  Take 1 tablet twice a day by oral route.     atorvastatin (LIPITOR) 20 MG tablet Take 1 tablet (20 mg total) by mouth at bedtime. 90 tablet 0   azelastine (ASTELIN) 0.1 % nasal spray azelastine 137 mcg (0.1 %) nasal spray aerosol  PLACE 1 SPRAY INTO BOTH NOSTRILS 2 (TWO) TIMES DAILY     calcium carbonate (OSCAL) 1500 (600 Ca) MG TABS tablet Calcium 600 mg  calcium (1,500 mg) tablet  Take 1 tablet 3 times a day by oral route.     Calcium Citrate (CITRACAL PO) Take 800 mg by mouth. Takes 2 ('400mg'$ ) tablets BID     Cholecalciferol 100 MCG (4000 UT) CAPS Take 3 capsules by mouth.     clobetasol (TEMOVATE) 0.05 % external solution Apply topically as directed. Qd to aa itchy rash until clear, avoid face, groin, axilla 50 mL 0   Cyanocobalamin (VITAMIN B-12) 1000 MCG SUBL Place 1,000 mcg under the tongue.      diclofenac sodium (VOLTAREN) 1 % GEL Apply small amount (2 grams) topically over knees or hands up to four times a day if needed for arthritis 100 g 0   famotidine (PEPCID) 20 MG tablet Take 1 tablet (20 mg total) by mouth 2 (two) times daily as needed for heartburn or indigestion. 180 tablet 0   ferrous sulfate 325 (65 FE) MG tablet Take 1 tablet (325 mg total) by mouth daily with breakfast. 90 tablet 0   fluconazole (DIFLUCAN) 200 MG tablet Take 200 mg by mouth as needed.      gabapentin (NEURONTIN) 100 MG capsule Take 100 mg by mouth 3 (three) times daily.     hydrALAZINE (APRESOLINE) 25 MG tablet Take 1 tablet (25 mg total) by mouth 3 (three) times daily as needed (for pressure >150). 90 tablet 1   HYDROcodone-acetaminophen (NORCO/VICODIN) 5-325 MG tablet Take 1 tablet by mouth every 6 (six) hours as needed.     hydrOXYzine (ATARAX/VISTARIL) 10 MG tablet TAKE 1 TABLET BY MOUTH EVERY 8 HOURS AS NEEDED 30 tablet 0   Icosapent Ethyl (VASCEPA) 1 g CAPS TAKE 2 CAPSULES BY MOUTH TWICE DAILY (TO HELP WITH  TRIGLYCERIDES) 180 capsule 0   ivermectin (STROMECTOL) 3 MG TABS tablet TAKE FIVE TABLETS BY MOUTH ON AN EMPTY STOMACH. REPEAT IN ONE WEEK. [place] BEDDING, TOWELS, WORN CLOTHES IN HOT WATER AFTER EACH DOSE 10 tablet 0   ketoconazole (NIZORAL) 2 % cream      Lactulose 20 GM/30ML SOLN 30 ml every 4 hours until constipation is relieved 236 mL 0   levalbuterol (XOPENEX) 0.63 MG/3ML nebulizer solution Take 3 mLs (0.63 mg total) by nebulization every 4 (four) hours as needed for wheezing or shortness of breath. 300 mL 2   levothyroxine (SYNTHROID) 100 MCG tablet Take 100 mcg by mouth daily before breakfast.     Lidocaine 4 % PTCH Aspercreme (lidocaine) 4 % topical patch     linaclotide (LINZESS) 290 MCG CAPS capsule Take 1 capsule (290 mcg total) by mouth daily before breakfast. 30 capsule 0   LORazepam (ATIVAN) 0.5 MG tablet TAKE 1 TABLET BY MOUTH AS NEEDED ONCE DAILY     losartan (COZAAR) 50 MG tablet TAKE 2 TABLETS (100 MG TOTAL) BY MOUTH DAILY. 180 tablet 0   Magnesium 500 MG TABS Take 500 mg by mouth every other day.     mupirocin ointment (BACTROBAN) 2 % Apply 1 application topically 3 (three) times daily.     neomycin-bacitracin-polymyxin (NEOSPORIN) ointment Apply topically.     ofloxacin (FLOXIN) 0.3 % OTIC solution ofloxacin 0.3 % ear drops     ofloxacin (OCUFLOX) 0.3 % ophthalmic solution Apply to eye.     ondansetron (ZOFRAN) 4 MG tablet ondansetron HCl 4 mg tablet     PARoxetine (PAXIL) 20 MG tablet Take 20 mg by mouth daily.     polyethylene glycol-electrolytes (NULYTELY) 420 g solution peg-electrolyte solution 420 gram oral solution  prednisoLONE acetate (PRED FORTE) 1 % ophthalmic suspension INSTILL 1 DROP INTO EACH EYE TWICE DAILY     PREMARIN vaginal cream INSERT ONE APPLICATORFUL VAGINALLY TWICE A WEEK. 30 g 1   promethazine (PHENERGAN) 12.5 MG tablet Take 12.5 mg by mouth every 6 (six) hours as needed for nausea or vomiting.     pyridoxine (B-6) 100 MG tablet Take 1 tablet (100 mg  total) by mouth 2 (two) times daily. 60 tablet 0   REFRESH 1.4-0.6 % SOLN Apply to eye.     Senna-Natural Laxatives (SENOKOT LAXATIVE) Tea Bag MISC Drink 1 cup daily as needed for constipation. Adding 54m of Lactulose to tea.     terconazole (TERAZOL 7) 0.4 % vaginal cream terconazole 0.4 % vaginal cream     tiZANidine (ZANAFLEX) 2 MG tablet Take by mouth every 6 (six) hours as needed for muscle spasms.     triamcinolone ointment (KENALOG) 0.5 % Apply 1 application topically 2 (two) times daily. 30 g 2   umeclidinium bromide (INCRUSE ELLIPTA) 62.5 MCG/INH AEPB Inhale 1 puff into the lungs daily. 60 each 5   Vitamin D, Ergocalciferol, (DRISDOL) 1.25 MG (50000 UNIT) CAPS capsule Take 1 capsule (50,000 Units total) by mouth every 7 (seven) days. 12 capsule 3   XIIDRA 5 % SOLN INSTILL 1 DROP INTO EACH EYE TWICE DAILY      Musculoskeletal: Strength & Muscle Tone: within normal limits Gait & Station: unsteady Patient leans: N/A            Psychiatric Specialty Exam:  Presentation  General Appearance: Appropriate for Environment Eye Contact:Fair Speech:Clear and Coherent Speech Volume:Decreased Handedness:Right  Mood and Affect  Mood:Depressed; Dysphoric; Hopeless; Worthless Affect:Congruent  TSocial workerProcesses:Coherent Descriptions of Associations:Intact  Orientation:Full (Time, Place and Person)  Thought Content:Paranoid Ideation  History of Schizophrenia/Schizoaffective disorder:No  Duration of Psychotic Symptoms:N/A  Hallucinations:Hallucinations: None  Ideas of Reference:None  Suicidal Thoughts:Suicidal Thoughts: Yes, Passive SI Passive Intent and/or Plan: Without Plan  Homicidal Thoughts:Homicidal Thoughts: No   Sensorium  Memory:Immediate Fair Judgment:Poor Insight:Poor  Executive Functions  Concentration:No data recorded Attention Span:Fair [place]an Psychomotor Activity  Psychomotor  Activity:No data recorded  Assets  Assets:Desire for Improvement; Communication Skills  Sleep  Sleep:Sleep: Fair  Physical Exam: Physical Exam Vitals and nursing note reviewed.  HENT:     Head: Normocephalic and atraumatic.     Mouth/Throat:     Mouth: Mucous membranes are dry.  Eyes:     Pupils: Pupils are equal, round, and reactive to light.  Pulmonary:     Effort: Pulmonary effort is normal.  Musculoskeletal:        General: Normal range of motion.     Cervical back: Normal range of motion.  Skin:    General: Skin is dry.  Neurological:     Mental Status: She is alert and oriented to person, place, and time.  Psychiatric:        Attention and Perception: Attention and perception normal.        Mood and Affect: Mood is depressed.        Behavior: Behavior is cooperative.        Thought Content: Thought content is delusional. Thought content includes suicidal ideation.        Judgment: Judgment is inappropriate.    Review of Systems  Psychiatric/Behavioral:  Positive for depression and suicidal ideas. The patient is nervous/anxious.   All other systems reviewed and are negative.  Blood pressure (Mar[place] Kitchen  156/76, pulse 67, temperature 98.7 F (37.1 C), temperature source Oral, resp. rate 16, height '5\' 3"'$  (1.6 m), weight 65.8 kg, SpO2 99 %. Body mass index is 25.69 kg/m.  Treatment Plan Summary: Daily contact with patient to assess and evaluate symptoms and progress in treatment and Medication management  Disposition: Recommend psychiatric Inpatient admission when medically cleared. Supportive therapy provided about ongoing stressors. Discussed crisis plan, support from social network, calling 911, coming to the Emergency Department, and calling Suicide Hotline.  Deloria Lair, NP 09/10/2021 5:04 AM

## 2021-09-10 NOTE — ED Notes (Signed)
LUNCH TRAY GIVEN.

## 2021-09-10 NOTE — TOC Initial Note (Signed)
Transition of Care (TOC) - Initial/Assessment Note    Patient Details  Name: Angela [name] MRN: [id number] Date of Birth: 06/19/1950  Transition of Care (TOC) CM/SW Contact:     M , RN Phone Number: 09/10/2021, 12:08 PM  Clinical Narrative:                 Patient being seen in the emergency room due to feelings of depression, hopelessness, and not feeling safe due to being homeless.   RNCM met with patient at the bedside, patient has been cleared by psychiatry.  Patient reports that the psychiatry provider just shook her awake this morning asked her if she was feeling like killing herself and then said she could be discharged.   Patient states "I am asking for help"  I don't feel safe".  Patient has a history of personality disorder and bipolar, she says that she has been kept from getting food and kept from her inheritance that was placed in a trust after her father died in 2018 by a third rd party trustee holder.  She reports that after her father died the rest of her family told her to never come back home.   Patient reports that she went to [employer] for photography, that she has studied law and medicine and has worked with [employer] here in [place] in the past.  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  Patient's story does not follow a logical tract, she becomes defensive when RNCM questions her storyline and timeline of events that have transpired.     RNCM talked with patient about recent events she reports that her apartment had black mold and that she couldn't live there in [place] so she has been staying at a hotel, she had to be out of her apartment yesterday and she says she does not have any more money for hotel rooms.  She reports not being able to go to [facility] because they won't take someone with a walker and she has a walking boot on.  RNCM asked if she would stay  with her friend Flo, Flo picked her up from [place] and brought her here to [facility].  Patient says that Flo is not allowed to have guests.  RNCM called and spoke with Flo and she did not offer to have patient say with her even after RNCM asked her if patient could stay with her, she just didn't respond and said, " well I don't know what to tell you".    RNCM offered to help patient get into an assisted living facility, she reports that she has been accepted to 2 facilities one in [place] and one in [place] but she can't go because she does not have access to her funds.  RNCM explains that we could help her get into a facility here because she has Medicaid, she would just need to turn over her SSI.  Patient is not willing to part with her check. RNCM called and left a message for the on call APS worker to return call so a APS report can be made. Checking with the homeless shelter in [place]- they currently do not have a female bed, Shelter recommended that patient call their hotline to be placed on a waiting list. [facility] in [place] does not have a bed either at this time.  RNCM also reached out to [facility], she is   not eligible because it is a working shelter, [facility] no longer provides overnight shelter stay except in cold months, [facility] [facility] also no longer offers overnight shelter just a day program.     Expected Discharge Plan: Homeless Shelter Barriers to Discharge: Unsafe home situation   Patient Goals and CMS Choice Patient states their goals for this hospitalization and ongoing recovery are:: Patient states she does not feel safe due to her living situation being homeless      Expected Discharge Plan and Services Expected Discharge Plan: [place]   Discharge Planning Services: CM Consult   Living arrangements for the past 2 months: Homeless, Hotel/Motel                 DME Arranged: N/A DME Agency: NA       HH Arranged:  NA [place] Agency: NA        Prior Living Arrangements/Services Living arrangements for the past 2 months: Homeless, Hotel/Motel Lives with:: Self Patient language and need for interpreter reviewed:: Yes Do you feel safe going back to the place where you live?: No   reports not feeling safe being homeless  Need for Family Participation in Patient Care: Yes (Comment) Care giver support system in place?: No (comment) Current home services: DME ([facility]) Criminal Activity/Legal Involvement Pertinent to Current Situation/Hospitalization: No - Comment as needed  Activities of Daily Living      Permission Sought/Granted Permission sought to share information with : Family Supports    Share Information with NAME: Meryle Ready     Permission granted to share info w Relationship: friend  Permission granted to share info w Contact Information: [phone]  Emotional Assessment Appearance:: Appears stated age Attitude/Demeanor/Rapport: Paranoid, Reactive, Suspicious, Inconsistent Affect (typically observed): Frustrated, In denial, Defensive Orientation: : Oriented to Self, Oriented to Place, Oriented to  Time, Oriented to Situation Alcohol / Substance Use: Not Applicable Psych Involvement: Yes (comment)  Admission diagnosis:  Mental Eval Patient Active Problem List   Diagnosis Date Noted   Stress reaction causing mixed disturbance of emotion and conduct 09/10/2021   Homeless    Mixed hyperlipidemia 07/02/2019   Hypothyroidism due to acquired atrophy of thyroid 04/29/2019   Osteopenia determined by x-ray 04/29/2019   Acquired claw toe of both feet 10/23/2018   Aortic atherosclerosis ([place]) 05/31/2018   Achilles tendon disorder 12/45/8099   Diastolic dysfunction 83/38/2505   Mitral regurgitation 03/29/2018   Personality disorder ([place]) 12/25/2017   Arthritis of hand, degenerative 12/07/2017   Hallux valgus, acquired 07/05/2017   Hammer toes of both feet 07/05/2017   Unstable  ankle 07/05/2017   Tinea unguium 05/08/2017   Conductive hearing loss of right ear with unrestricted hearing of left ear 04/03/2017   Adjustment disorder with depressed mood 01/18/2017   Migraine 01/18/2017   Irritable bowel syndrome with constipation 11/17/2016   Bilateral carpal tunnel syndrome 11/17/2016   Plantar fasciitis, bilateral 11/17/2016   Cervical myofascial pain syndrome 11/17/2016   Chronic hyponatremia 11/17/2016   PTSD (post-traumatic stress disorder) 11/17/2016   Hiatal hernia 11/17/2016   Vitamin D deficiency 11/17/2016   Vitamin B12 deficiency 11/17/2016   Cervical spondylosis with myelopathy 05/24/2016   MCI (mild cognitive impairment) with memory loss 03/30/2016   Gait abnormality 03/30/2016   Chronic bipolar affective disorder ([place]) 01/24/2016   Anemia, iron deficiency 06/24/2015   Benign essential HTN 06/24/2015   History of falling 05/22/2015   History of TIA (transient ischemic attack) 04/20/2015   Gastroesophageal reflux disease with hiatal  hernia 04/20/2015   Cervical disc disorder at C5-C6 level with radiculopathy 03/25/2015   Chronic neck pain 03/25/2015   Pelvic pain in female 10/30/2014   History of skin cancer 06/30/2014   Chronic headaches 12/16/2013   Neuropathy 12/16/2013   PCP:  Husmann, Kari J, FNP Pharmacy:   [id number] - [place], [place] - [address] [place] [address] Phone: [phone] Fax: [phone]  [facility] - [place], [place] - [address] [place] [address] Phone: [phone] Fax: [phone]  [facility] - [place], [place] - [address] [place] [address] Phone: [phone] Fax: [phone]     Social Determinants of Health (SDOH) Interventions    Readmission Risk Interventions     No data to display           

## 2021-09-10 NOTE — ED Triage Notes (Signed)
Pt reports she feels for her life, Pt reports she is homeless, and does not have food to eat. Reports SI denies any HI. Pt denies auditory or visual hallucinations.

## 2021-09-10 NOTE — ED Notes (Addendum)
Pt states her right pinkie finger is fractured and has been seen at a physical therapist in [place] for it and was told by the doctor to keep the mitts on her hands to help with support and circulation. She believes that the last time she was seen at the Physical therapist over two weeks ago but states she was not able to return because of her apartments black mold situation and her repeatedly passing out.

## 2021-09-10 NOTE — TOC Transition Note (Signed)
Transition of Care [facility]) - CM/SW Discharge Note   Patient Details  Name: Angela [name] MRN: [id number] Date of Birth: [birth year]  [facility]) CM/SW Contact:  Angela Hutching, RN Phone Number: 09/10/2021, 1:10 PM   Clinical Narrative:    Patient has declined for [facility] to find ALF placement.  RNCM provided patient with a Environmental health practitioner with [employer] and food bank information, Department of [employer], museum/gallery, and other resources for [employer].   Emergency room MD will speak with patient before discharge.  Patient will be discharged from the ED unless MD finds adequate reason to re- consult psychiatry.      Barriers to Discharge: Unsafe home situation   Patient Goals and CMS Choice Patient states their goals for this hospitalization and ongoing recovery are:: Patient states she does not feel safe due to her living situation being homeless      Discharge Placement                       Discharge Plan and Services   Discharge Planning Services: CM Consult            DME Arranged: N/A DME Agency: NA       HH Arranged: NA HH Agency: NA        Social Determinants of Health (SDOH) Interventions     Readmission Risk Interventions     No data to display

## 2021-09-10 NOTE — TOC Transition Note (Signed)
Transition of Care [facility]) - CM/SW Discharge Note   Patient Details  Name: Angela [name] MRN: [id number] Date of Birth: Jul 28, 1950  Transition of Care [facility]) CM/SW Contact:  Shelbie Hutching, RN Phone Number: 09/10/2021, 1:46 PM   Clinical Narrative:    Provided patient with the number for the [facility] hotline so that she can get on their waiting list.      Barriers to Discharge: Unsafe home situation   Patient Goals and CMS Choice Patient states their goals for this hospitalization and ongoing recovery are:: Patient states she does not feel safe due to her living situation being homeless      Discharge Placement                       Discharge Plan and Services   Discharge Planning Services: CM Consult            DME Arranged: N/A DME Agency: NA       HH Arranged: NA HH Agency: NA        Social Determinants of Health (SDOH) Interventions     Readmission Risk Interventions     No data to display

## 2021-09-10 NOTE — BH Assessment (Signed)
Psych Team met with patient for reassessment. Patient reports she is homeless and "person" whom she would not disclose, is managing her money. Patient appeared neatly dressed with new shoes. Patient reports she was upset about moving items from one place to another. Patient seemed irritable during interview. Patient endorsed passive SI.   Per Roselyn Reef, NP, patient does not meet criteria for inpatient psychiatric admission and [facility] consult was placed for homeless resources.

## 2021-09-10 NOTE — Consult Note (Signed)
[facility] Consult   Reason for Consult:  Psych evaluation  Referring Physician:  Dr. Beather Arbour  Patient Identification: Angela [name] MRN:  [id number] Principal Diagnosis: Stress reaction causing mixed disturbance of emotion and conduct Diagnosis:  Principal Problem:   Stress reaction causing mixed disturbance of emotion and conduct Active Problems:   Personality disorder ([place])   Total Time spent with patient: 45 minutes  Subjective:   Angela [name] is a 71 y.o. female patient admitted with multiple medical concerns and passive SI, initially.    Client awakened and is not delusional today or suicidal.  She has intermittent, passive suicidal ideations at time over the person in [place] who manages her monies. She was frustrated that she had "to clean out some stuff" from a hotel room and suffers from chronic insomnia per notes.  However, she did sleep in the ED last night without issues in the hall area.  She is slightly irritable at times on assessment saying this provider should not ask her if she has thoughts of harming herself.  I explained the question needed to be asked to assess for safety.  No homicidal ideations, psychosis, or substance abuse.  She has had multiple visits to providers in [place], [place] over the years for medical issues.  Also noted to have bipolar disorder, no mania on assessment.  She has also been diagnosed and treated with DBT for borderline personality disorder.  Also noted in the ED reports of malingering.  Low level of depression based on her living situation.  She is neat with clean clothes with multiple demands from staff for food and other things.  She does  not meet criteria or warrants a psych admission.  TOC consult placed for homeless resources.  Caveat:  The address she has listed is for an apartment in [place] cleared.  HPI:  Psych Assessment per Angela [name], Angela [name], 71 y.o., female  patient seen  by TTS and this provider; chart reviewed and consulted with Dr. Beather Arbour on 09/10/21.  On evaluation Angela [name] reports that she wishes she was dead. She reports that her father died and left her some money but that her brother hired a "third" party to manage it. She states that the third party has been "playing games" with the money and causes her to be without medication and food.  She reports going days without food and medication.  She reports being "put" in a moldy apartment that causes her to fall a lot.  She then began to talk a lot about her families mental health condition. She began making paranoid statements such as "they are out to get me, and I havent slept in days".    During evaluation Angela [name] is laying on the hall stretcher; she is alert/oriented x 4; calm/cooperative; and mood congruent with affect.  Patient is speaking in a clear tone at moderate volume, and normal pace; with good eye contact. Her thought process is mostly coherent and relevant; but she goes off on a tangent that makes little to no sense.  There is no indication that she is currently responding to internal/external stimuli.  Some of her thought content  may be delusional but it is difficult to determine without speaking with collateral.  Patient endorse passive suicidal ideation.  Making statements such as "I wish I was dead".  She also displays paranoia during the assessment.  Patient has remained calm throughout assessment and has answered questions  appropriately.    Past Psychiatric History: depression, bipolar, and BPD per notes  Risk to Self:   Risk to Others:   Prior Inpatient Therapy:   Prior Outpatient Therapy:    Past Medical History:  Past Medical History:  Diagnosis Date   Agitation 09/18/2017   Allergic rhinitis    Altered mental status, unspecified 12/25/2018   Anemia    Blurred vision    Chest pain of uncertain etiology 07/19/5007   Confusion state 12/25/2018   COPD  (chronic obstructive pulmonary disease) (HCC)    Depression    Diverticulosis    Endometriosis    Falls    GERD (gastroesophageal reflux disease)    Hernia, hiatal    Hypothyroidism    IBS (irritable bowel syndrome)    Labile mood    Malignant neoplasm of skin    Malnutrition of mild degree ([place]) 08/08/2018   Migraine    Neuropathy    Noncompliance 12/25/2017   PTSD (post-traumatic stress disorder)    SIADH (syndrome of inappropriate ADH production) ([place]) 09/08/2016   Thyroid disease     Past Surgical History:  Procedure Laterality Date   APPENDECTOMY     CHOLECYSTECTOMY     CHOLECYSTECTOMY, LAPAROSCOPIC     ESOPHAGOGASTRODUODENOSCOPY (EGD) WITH PROPOFOL N/A 03/29/2015   Procedure: ESOPHAGOGASTRODUODENOSCOPY (EGD) WITH PROPOFOL;  Surgeon: Josefine Class, MD;  Location: [facility] ENDOSCOPY;  Service: Endoscopy;  Laterality: N/A;   HERNIA REPAIR     laproscopy     TONSILLECTOMY     uteral suspension     Family History:  Family History  Problem Relation Age of Onset   Heart disease Father    Hearing loss Father    COPD Father    Depression Father    Stroke Father    Vision loss Father    Varicose Veins Father    Cancer Mother        lung   Depression Sister    Arthritis Sister    Arthritis Brother    Hernia Brother    Anxiety disorder Brother    Arthritis Brother    Hernia Brother    Anxiety disorder Brother    Urolithiasis Neg Hx    Kidney disease Neg Hx    Kidney cancer Neg Hx    Prostate cancer Neg Hx    Breast cancer Neg Hx    Family Psychiatric  History:  Social History:  Social History   Substance and Sexual Activity  Alcohol Use Yes   Comment: occ     Social History   Substance and Sexual Activity  Drug Use No    Social History   Socioeconomic History   Marital status: Single    Spouse name: Not on file   Number of children: 0   Years of education: 1.5 years of college   Highest education level: Some college, no degree  Occupational  History   Occupation: Retired  Tobacco Use   Smoking status: Former    Types: Cigarettes    Quit date: 11/17/1976    Years since quitting: 44.8   Smokeless tobacco: Never  Vaping Use   Vaping Use: Never used  Substance and Sexual Activity   Alcohol use: Yes    Comment: occ   Drug use: No   Sexual activity: Not Currently  Other Topics Concern   Not on file  Social History Narrative   Lives at home alone.   Right-handed.   No daily caffeine use.   Social Determinants of  Health   Financial Resource Strain: High Risk (06/19/2019)   Overall Financial Resource Strain (CARDIA)    Difficulty of Paying Living Expenses: Very hard  Food Insecurity: Food Insecurity Present (06/19/2019)   Hunger Vital Sign    Worried About Running Out of Food in the Last Year: Often true    Ran Out of Food in the Last Year: Often true  Transportation Needs: Unmet Transportation Needs (06/19/2019)   PRAPARE - Hydrologist (Medical): Yes    Lack of Transportation (Non-Medical): Yes  Physical Activity: Inactive (06/19/2019)   Exercise Vital Sign    Days of Exercise per Week: 0 days    Minutes of Exercise per Session: 0 min  Stress: Stress Concern Present (06/19/2019)   [place] of Stress : Very much  Social Connections: Unknown (06/19/2019)   Social Connection and Isolation Panel [NHANES]    Frequency of Communication with Friends and Family: Never    Frequency of Social Gatherings with Friends and Family: Never    Attends Religious Services: More than 4 times per year    Active Member of [employer] or Organizations: No    Attends Archivist Meetings: Never    Marital Status: Patient refused   Additional Social History:    Allergies:   Allergies  Allergen Reactions   Amoxicillin-Pot Clavulanate Anaphylaxis and Hives   Aspirin Hives   Bee Venom Hives    Spider bites cause hives Spider bites  cause hives   Cephalexin Hives and Other (See Comments)    Other reaction(s): Unknown    Dicyclomine Itching   Fluticasone-Salmeterol Shortness Of Breath   Haloperidol Nausea And Vomiting, Anaphylaxis and Other (See Comments)   Molds & Smuts Shortness Of Breath and Itching    Eyes-itchy Eyes-itchy Eyes-itchy Eyes-itchy Eyes-itchy    Other Hives, Rash, Anaphylaxis, Itching and Swelling    Spider bites cause hives Hives/throat swelling Hives/throat swelling   Oxycodone-Acetaminophen Nausea And Vomiting, Other (See Comments) and Anxiety    hallucinations  hallucinations hallucinations hallucinations    Peanut Butter Flavor Anaphylaxis   Peanuts [Peanut Oil] Anaphylaxis and Hives   Penicillin G Anaphylaxis   Penicillins Hives and Anaphylaxis    Has patient had a PCN reaction causing immediate rash, facial/tongue/throat swelling, SOB or lightheadedness with hypotension: Unknown Has patient had a PCN reaction causing severe rash involving mucus membranes or skin necrosis: Unknown Has patient had a PCN reaction that required hospitalization: Unknown Has patient had a PCN reaction occurring within the last 10 years: Unknown If all of the above answers are "NO", then may proceed with Cephalosporin use. Has patient had a PCN reaction causing immediate rash, facial/tongue/throat swelling, SOB or lightheadedness with hypotension: Unknown Has patient had a PCN reaction causing severe rash involving mucus membranes or skin necrosis: Unknown Has patient had a PCN reaction that required hospitalization: Unknown Has patient had a PCN reaction occurring within the last 10 years: Unknown If all of the above answers are "NO", then may proceed with Cephalosporin use. Has patient had a PCN reaction causing immediate rash, facial/tongue/throat swelling, SOB or lightheadedness with hypotension: Unknown Has patient had a PCN reaction causing severe rash involving mucus membranes or skin necrosis:  Unknown Has patient had a PCN reaction that required hospitalization: Unknown Has patient had a PCN reaction occurring within the last 10 years: Unknown If all of the above answers are "NO", then may proceed with Cephalosporin  use.   Propoxyphene Anaphylaxis and Hives    Hives/throat swelling  Hives/throat swelling Hives/throat swelling Hives/throat swelling Hives/throat swelling    Sulfa Antibiotics Anaphylaxis, Hives and Itching   Sulfacetamide Sodium Anaphylaxis, Hives and Itching   Tramadol Hives and Other (See Comments)    Other reaction(s): Unknown   Amikacin Nausea And Vomiting and Other (See Comments)   Bee Pollen Other (See Comments)    Other reaction(s): Unknown   Imipramine Hives    Reaction to Tofranil Reaction to Tofranil Reaction to Tofranil   Prednisone Nausea And Vomiting, Anxiety and Other (See Comments)    Crazy mood swings, strange thoughts. Tolerates depomedrol   Crazy mood swings, strange thoughts Other reaction(s): Other (See Comments) Crazy mood swings, strange thoughts. Tolerates depomedrol  Other reaction(s): Other (See Comments) Crazy mood swings, strange thoughts (pt does tolerate cortisone injections) Crazy mood swings, strange thoughts. Tolerates depomedrol  Crazy mood swings, strange thoughts   Sulfur Hives   Azithromycin Other (See Comments)    Itching Itching Itching Bloody stool and high BP Bloody stool and high BP    Doxycycline Photosensitivity   Elemental Sulfur Hives   Fluticasone    Honey Bee Venom    Hymenoptera Venom Preparations Hives    Reaction to spider bites   Lactose     Other reaction(s): Unknown   Lactose Intolerance ([place])    Peanut-Containing Drug Products    Phthalylsulfacetamide    Pregabalin Other (See Comments)   Risperidone Other (See Comments)    Serotonin syndrome Other reaction(s): Unknown Serotonin syndrome Serotonin syndrome Serotonin syndrome Serotonin syndrome    Sulfacetamide    Wound Dressing  Adhesive    Adhesive [Tape] Hives and Rash   Albuterol Palpitations    Tolerates levalbuterol Tolerates levalbuterol Tolerates levalbuterol   Ciprofloxacin Other (See Comments)    tendonopathy   Hydroxyzine Itching and Anxiety    Vistaril per pt    Labs:  Results for orders placed or performed during the hospital encounter of 09/10/21 (from the past 48 hour(s))  Urine Drug Screen, Qualitative     Status: None   Collection Time: 09/10/21 12:50 AM  Result Value Ref Range   Tricyclic, Ur Screen NONE DETECTED NONE DETECTED   Amphetamines, Ur Screen NONE DETECTED NONE DETECTED   MDMA (Ecstasy)Ur Screen NONE DETECTED NONE DETECTED   Cocaine Metabolite,Ur [place] NONE DETECTED NONE DETECTED   Opiate, Ur Screen NONE DETECTED NONE DETECTED   Phencyclidine (PCP) Ur S NONE DETECTED NONE DETECTED   Cannabinoid 50 Ng, Ur [place] NONE DETECTED NONE DETECTED   Barbiturates, Ur Screen NONE DETECTED NONE DETECTED   Benzodiazepine, Ur Scrn NONE DETECTED NONE DETECTED   Methadone Scn, Ur NONE DETECTED NONE DETECTED    Comment: (NOTE) Tricyclics + metabolites, urine    Cutoff 1000 ng/mL Amphetamines + metabolites, urine  Cutoff 1000 ng/mL MDMA (Ecstasy), urine              Cutoff 500 ng/mL Cocaine Metabolite, urine          Cutoff 300 ng/mL Opiate + metabolites, urine        Cutoff 300 ng/mL Phencyclidine (PCP), urine         Cutoff 25 ng/mL Cannabinoid, urine                 Cutoff 50 ng/mL Barbiturates + metabolites, urine  Cutoff 200 ng/mL Benzodiazepine, urine              Cutoff 200 ng/mL Methadone, urine  Cutoff 300 ng/mL  The urine drug screen provides only a preliminary, unconfirmed analytical test result and should not be used for non-medical purposes. Clinical consideration and professional judgment should be applied to any positive drug screen result due to possible interfering substances. A more specific alternate chemical method must be used in order to obtain a confirmed  analytical result. Gas chromatography / mass spectrometry (GC/MS) is the preferred confirm atory method. Performed at [facility], [address] [place], [place] [address]   Comprehensive metabolic panel     Status: None   Collection Time: 09/10/21  1:11 AM  Result Value Ref Range   Sodium 136 135 - 145 mmol/L   Potassium 4.1 3.5 - 5.1 mmol/L   Chloride 106 98 - 111 mmol/L   CO2 25 22 - 32 mmol/L   Glucose, Bld 85 70 - 99 mg/dL    Comment: Glucose reference range applies only to samples taken after fasting for at least 8 hours.   BUN 20 8 - 23 mg/dL   Creatinine, Ser 0.71 0.44 - 1.00 mg/dL   Calcium 9.3 8.9 - 10.3 mg/dL   Total Protein 7.2 6.5 - 8.1 g/dL   Albumin 4.0 3.5 - 5.0 g/dL   AST 24 15 - 41 U/L   ALT 19 0 - 44 U/L   Alkaline Phosphatase 90 38 - 126 U/L   Total Bilirubin 0.7 0.3 - 1.2 mg/dL   GFR, Estimated >60 >60 mL/min    Comment: (NOTE) Calculated using the CKD-EPI Creatinine Equation (2021)    Anion gap 5 5 - 15    Comment: Performed at [facility], [address] [place], [place] [address]  Ethanol     Status: None   Collection Time: 09/10/21  1:11 AM  Result Value Ref Range   Alcohol, Ethyl (B) <10 <10 mg/dL    Comment: (NOTE) Lowest detectable limit for serum alcohol is 10 mg/dL.  For medical purposes only. Performed at [facility], [address] [place], [place] [address]   Salicylate level     Status: Abnormal   Collection Time: 09/10/21  1:11 AM  Result Value Ref Range   Salicylate Lvl <3.2 (L) 7.0 - 30.0 mg/dL    Comment: Performed at [facility], [address] [place], [place] [address]  Acetaminophen level     Status: Abnormal   Collection Time: 09/10/21  1:11 AM  Result Value Ref Range   Acetaminophen (Tylenol), Serum <10 (L) 10 - 30 ug/mL    Comment: (NOTE) Therapeutic concentrations vary significantly. A range of 10-30 ug/mL  may be an effective concentration for many patients. However,  some  are best treated at concentrations outside of this range. Acetaminophen concentrations >150 ug/mL at 4 hours after ingestion  and >50 ug/mL at 12 hours after ingestion are often associated with  toxic reactions.  Performed at [facility], [address] [place], [place] [address]   cbc     Status: None   Collection Time: 09/10/21  1:11 AM  Result Value Ref Range   WBC 5.4 4.0 - 10.5 K/uL   RBC 4.34 3.87 - 5.11 MIL/uL   Hemoglobin 12.8 12.0 - 15.0 g/dL   HCT 39.0 36.0 - 46.0 %   MCV 89.9 80.0 - 100.0 fL   MCH 29.5 26.0 - 34.0 pg   MCHC 32.8 30.0 - 36.0 g/dL   RDW 14.2 11.5 - 15.5 %   Platelets 277 150 - 400 K/uL  nRBC 0.0 0.0 - 0.2 %    Comment: Performed at [facility], [address] [place], [place] [address]    No current facility-administered medications for this encounter.   Current Outpatient Medications  Medication Sig Dispense Refill   acetaminophen (TYLENOL) 650 MG CR tablet Take 650 mg by mouth every 8 (eight) hours as needed for pain.     Acetylcysteine (NAC) 500 MG CAPS Take 500 mg by mouth 2 (two) times a day.     albuterol (VENTOLIN HFA) 108 (90 Base) MCG/ACT inhaler albuterol sulfate HFA 90 mcg/actuation aerosol inhaler  INHALE 2 PUFFS BY MOUTH EVERY 4 HOURS AS NEEDED     ascorbic acid (VITAMIN C) 1000 MG tablet Vitamin C 1,000 mg tablet  Take 1 tablet twice a day by oral route.     atorvastatin (LIPITOR) 20 MG tablet Take 1 tablet (20 mg total) by mouth at bedtime. 90 tablet 0   azelastine (ASTELIN) 0.1 % nasal spray azelastine 137 mcg (0.1 %) nasal spray aerosol  PLACE 1 SPRAY INTO BOTH NOSTRILS 2 (TWO) TIMES DAILY     calcium carbonate (OSCAL) 1500 (600 Ca) MG TABS tablet Calcium 600 mg calcium (1,500 mg) tablet  Take 1 tablet 3 times a day by oral route.     Calcium Citrate (CITRACAL PO) Take 800 mg by mouth. Takes 2 ('400mg'$ ) tablets BID     Cholecalciferol 100 MCG (4000 UT) CAPS Take 3 capsules by mouth.     clobetasol  (TEMOVATE) 0.05 % external solution Apply topically as directed. Qd to aa itchy rash until clear, avoid face, groin, axilla 50 mL 0   Cyanocobalamin (VITAMIN B-12) 1000 MCG SUBL Place 1,000 mcg under the tongue.      diclofenac sodium (VOLTAREN) 1 % GEL Apply small amount (2 grams) topically over knees or hands up to four times a day if needed for arthritis 100 g 0   famotidine (PEPCID) 20 MG tablet Take 1 tablet (20 mg total) by mouth 2 (two) times daily as needed for heartburn or indigestion. 180 tablet 0   ferrous sulfate 325 (65 FE) MG tablet Take 1 tablet (325 mg total) by mouth daily with breakfast. 90 tablet 0   fluconazole (DIFLUCAN) 200 MG tablet Take 200 mg by mouth as needed.      gabapentin (NEURONTIN) 100 MG capsule Take 100 mg by mouth 3 (three) times daily.     hydrALAZINE (APRESOLINE) 25 MG tablet Take 1 tablet (25 mg total) by mouth 3 (three) times daily as needed (for pressure >150). 90 tablet 1   HYDROcodone-acetaminophen (NORCO/VICODIN) 5-325 MG tablet Take 1 tablet by mouth every 6 (six) hours as needed.     hydrOXYzine (ATARAX/VISTARIL) 10 MG tablet TAKE 1 TABLET BY MOUTH EVERY 8 HOURS AS NEEDED 30 tablet 0   Icosapent Ethyl (VASCEPA) 1 g CAPS TAKE 2 CAPSULES BY MOUTH TWICE DAILY (TO HELP WITH TRIGLYCERIDES) 180 capsule 0   ivermectin (STROMECTOL) 3 MG TABS tablet TAKE FIVE TABLETS BY MOUTH ON AN EMPTY STOMACH. REPEAT IN ONE WEEK. [place] BEDDING, TOWELS, WORN CLOTHES IN HOT WATER AFTER EACH DOSE 10 tablet 0   ketoconazole (NIZORAL) 2 % cream      Lactulose 20 GM/30ML SOLN 30 ml every 4 hours until constipation is relieved 236 mL 0   levalbuterol (XOPENEX) 0.63 MG/3ML nebulizer solution Take 3 mLs (0.63 mg total) by nebulization every 4 (four) hours as needed for wheezing or shortness of breath. 300 mL 2   levothyroxine (  SYNTHROID) 100 MCG tablet Take 100 mcg by mouth daily before breakfast.     Lidocaine 4 % PTCH Aspercreme (lidocaine) 4 % topical patch     linaclotide (LINZESS)  290 MCG CAPS capsule Take 1 capsule (290 mcg total) by mouth daily before breakfast. 30 capsule 0   LORazepam (ATIVAN) 0.5 MG tablet TAKE 1 TABLET BY MOUTH AS NEEDED ONCE DAILY     losartan (COZAAR) 50 MG tablet TAKE 2 TABLETS (100 MG TOTAL) BY MOUTH DAILY. 180 tablet 0   Magnesium 500 MG TABS Take 500 mg by mouth every other day.     mupirocin ointment (BACTROBAN) 2 % Apply 1 application topically 3 (three) times daily.     neomycin-bacitracin-polymyxin (NEOSPORIN) ointment Apply topically.     ofloxacin (FLOXIN) 0.3 % OTIC solution ofloxacin 0.3 % ear drops     ofloxacin (OCUFLOX) 0.3 % ophthalmic solution Apply to eye.     ondansetron (ZOFRAN) 4 MG tablet ondansetron HCl 4 mg tablet     PARoxetine (PAXIL) 20 MG tablet Take 20 mg by mouth daily.     polyethylene glycol-electrolytes (NULYTELY) 420 g solution peg-electrolyte solution 420 gram oral solution     prednisoLONE acetate (PRED FORTE) 1 % ophthalmic suspension INSTILL 1 DROP INTO EACH EYE TWICE DAILY     PREMARIN vaginal cream INSERT ONE APPLICATORFUL VAGINALLY TWICE A WEEK. 30 g 1   promethazine (PHENERGAN) 12.5 MG tablet Take 12.5 mg by mouth every 6 (six) hours as needed for nausea or vomiting.     pyridoxine (B-6) 100 MG tablet Take 1 tablet (100 mg total) by mouth 2 (two) times daily. 60 tablet 0   REFRESH 1.4-0.6 % SOLN Apply to eye.     Senna-Natural Laxatives (SENOKOT LAXATIVE) Tea Bag MISC Drink 1 cup daily as needed for constipation. Adding 69m of Lactulose to tea.     terconazole (TERAZOL 7) 0.4 % vaginal cream terconazole 0.4 % vaginal cream     tiZANidine (ZANAFLEX) 2 MG tablet Take by mouth every 6 (six) hours as needed for muscle spasms.     triamcinolone ointment (KENALOG) 0.5 % Apply 1 application topically 2 (two) times daily. 30 g 2   umeclidinium bromide (INCRUSE ELLIPTA) 62.5 MCG/INH AEPB Inhale 1 puff into the lungs daily. 60 each 5   Vitamin D, Ergocalciferol, (DRISDOL) 1.25 MG (50000 UNIT) CAPS capsule Take 1  capsule (50,000 Units total) by mouth every 7 (seven) days. 12 capsule 3   XIIDRA 5 % SOLN INSTILL 1 DROP INTO EACH EYE TWICE DAILY      Musculoskeletal: Strength & Muscle Tone: within normal limits Gait & Station: WDL Patient leans: N/A  Psychiatric Specialty Exam: Physical Exam Vitals and nursing note reviewed.  HENT:     Head: Normocephalic.  Pulmonary:     Effort: Pulmonary effort is normal.  Musculoskeletal:        General: Normal range of motion.     Cervical back: Normal range of motion.  Neurological:     General: No focal deficit present.     Mental Status: She is alert and oriented to person, place, and time.  Psychiatric:        Attention and Perception: Attention and perception normal.        Mood and Affect: Mood is depressed.        Speech: Speech normal.        Behavior: Behavior is cooperative.        Thought Content: Thought content normal.  Cognition and Memory: Cognition and memory normal.        Judgment: Judgment normal. Judgment is not inappropriate.     Review of Systems  Psychiatric/Behavioral:  Positive for depression.   All other systems reviewed and are negative.   Blood pressure (!) 167/97, pulse 60, temperature 97.8 F (36.6 C), temperature source Oral, resp. rate 18, height '5\' 3"'$  (1.6 m), weight 65.8 kg, SpO2 100 %.Body mass index is 25.69 kg/m.  General Appearance: Casual  Eye Contact:  Good  Speech:  Normal Rate  Volume:  Normal  Mood:  Depressed and Irritable  Affect:  Congruent  Thought Process:  Coherent  Orientation:  Full (Time, Place, and Person)  Thought Content:  WDL and Logical  Suicidal Thoughts:  No  Homicidal Thoughts:  No  Memory:  Immediate;   Good Recent;   Good Remote;   Good  Judgement:  Fair  Insight:  Fair  Psychomotor Activity:  Normal  Concentration:  Concentration: Good and Attention Span: Good  Recall:  Good  Fund of Knowledge:  Good  Language:  Good  Akathisia:  No  Handed:  Right  AIMS (if  indicated):     Assets:  Leisure Time Resilience  ADL's:  Intact  Cognition:  WNL  Sleep:   Good in the ED     Physical Exam: Physical Exam Vitals and nursing note reviewed.  HENT:     Head: Normocephalic.  Pulmonary:     Effort: Pulmonary effort is normal.  Musculoskeletal:        General: Normal range of motion.     Cervical back: Normal range of motion.  Neurological:     General: No focal deficit present.     Mental Status: She is alert and oriented to person, place, and time.  Psychiatric:        Attention and Perception: Attention and perception normal.        Mood and Affect: Mood is depressed.        Speech: Speech normal.        Behavior: Behavior is cooperative.        Thought Content: Thought content normal.        Cognition and Memory: Cognition and memory normal.        Judgment: Judgment normal. Judgment is not inappropriate.    Review of Systems  Psychiatric/Behavioral:  Positive for depression.   All other systems reviewed and are negative.  Blood pressure (!) 167/97, pulse 60, temperature 97.8 F (36.6 C), temperature source Oral, resp. rate 18, height '5\' 3"'$  (1.6 m), weight 65.8 kg, SpO2 100 %. Body mass index is 25.69 kg/m.  Treatment Plan Summary: Stress reaction with mixed emotions and conduct: TOC for homeless resources Continue Paxil 20 mg daily Follow up with [facility] as needed  Disposition: Psychiatrically cleared, does not meet criteria for geropsych  Waylan Boga, NP 09/10/2021 9:48 AM

## 2021-09-10 NOTE — ED Provider Notes (Addendum)
[facility] Provider Note    Event Date/Time   First MD Initiated Contact with Patient 09/10/21 [phone]     (approximate) Note: Patient's name appeared on tracking board 37 minutes before patient was physically placed into her treatment room  History   Psychiatric Evaluation   HPI  Angela [name] is a 71 y.o. female who presents to the ED from local motel with a chief complaint of vague SI.  Patient states she has been homeless since the spring since she had numerous hospitalizations secondary to mold in her apartment.  She has not had access to her medications nor food for the past several months.  Vague SI without plan.  Denies HI/AH/VH.  Voices chronic shortness of breath.    Past Medical History   Past Medical History:  Diagnosis Date   Agitation 09/18/2017   Allergic rhinitis    Altered mental status, unspecified 12/25/2018   Anemia    Blurred vision    Chest pain of uncertain etiology 02/04/1476   Confusion state 12/25/2018   COPD (chronic obstructive pulmonary disease) (HCC)    Depression    Diverticulosis    Endometriosis    Falls    GERD (gastroesophageal reflux disease)    Hernia, hiatal    Hypothyroidism    IBS (irritable bowel syndrome)    Labile mood    Malignant neoplasm of skin    Malnutrition of mild degree ([place]) 08/08/2018   Migraine    Neuropathy    Noncompliance 12/25/2017   PTSD (post-traumatic stress disorder)    SIADH (syndrome of inappropriate ADH production) ([place]) 09/08/2016   Thyroid disease      Active Problem List   Patient Active Problem List   Diagnosis Date Noted   Bipolar affective disorder, currently depressed, moderate ([place])    Homeless    Mixed hyperlipidemia 07/02/2019   Hypothyroidism due to acquired atrophy of thyroid 04/29/2019   Osteopenia determined by x-ray 04/29/2019   Acquired claw toe of both feet 10/23/2018   Dermatitis 10/22/2018   Mixed stress and urge urinary incontinence 08/08/2018    Magnesium deficiency 08/08/2018   Aortic atherosclerosis ([place]) 05/31/2018   Achilles tendon disorder 04/25/2018   Insomnia 29/56/2130   Diastolic dysfunction 86/57/8469   Mitral regurgitation 03/29/2018   Personality disorder ([place]) 12/25/2017   Arthritis of hand, degenerative 12/07/2017   Bipolar I disorder, most recent episode mixed, severe with psychotic features ([place]) 09/18/2017   Hallux valgus, acquired 07/05/2017   Hammer toes of both feet 07/05/2017   Unstable ankle 07/05/2017   Chronic bilateral low back pain without sciatica 06/09/2017   Tinea unguium 05/08/2017   Conductive hearing loss of right ear with unrestricted hearing of left ear 04/03/2017   Adjustment disorder with depressed mood 01/18/2017   Migraine 01/18/2017   Irritable bowel syndrome with constipation 11/17/2016   Bilateral carpal tunnel syndrome 11/17/2016   Plantar fasciitis, bilateral 11/17/2016   Cervical myofascial pain syndrome 11/17/2016   Chronic hyponatremia 11/17/2016   PTSD (post-traumatic stress disorder) 11/17/2016   Hiatal hernia 11/17/2016   Vitamin D deficiency 11/17/2016   Vitamin B12 deficiency 11/17/2016   Cervical spondylosis with myelopathy 05/24/2016   MCI (mild cognitive impairment) with memory loss 03/30/2016   Gait abnormality 03/30/2016   Chronic bipolar affective disorder ([place]) 01/24/2016   Recurrent major depressive disorder, in partial remission ([place]) 01/24/2016   Anemia, iron deficiency 06/24/2015   Benign essential HTN 06/24/2015   History of falling 05/22/2015   History of  TIA (transient ischemic attack) 04/20/2015   Gastroesophageal reflux disease with hiatal hernia 04/20/2015   Cervical disc disorder at C5-C6 level with radiculopathy 03/25/2015   Chronic neck pain 03/25/2015   Pelvic pain in female 10/30/2014   History of skin cancer 06/30/2014   Chronic headaches 12/16/2013   Neuropathy 12/16/2013     Past Surgical History   Past Surgical History:  Procedure  Laterality Date   APPENDECTOMY     CHOLECYSTECTOMY     CHOLECYSTECTOMY, LAPAROSCOPIC     ESOPHAGOGASTRODUODENOSCOPY (EGD) WITH PROPOFOL N/A 03/29/2015   Procedure: ESOPHAGOGASTRODUODENOSCOPY (EGD) WITH PROPOFOL;  Surgeon: Josefine Class, MD;  Location: [facility] ENDOSCOPY;  Service: Endoscopy;  Laterality: N/A;   HERNIA REPAIR     laproscopy     TONSILLECTOMY     uteral suspension       Home Medications   Prior to Admission medications   Medication Sig Start Date End Date Taking? Authorizing Provider  acetaminophen (TYLENOL) 650 MG CR tablet Take 650 mg by mouth every 8 (eight) hours as needed for pain.    [provider]  Acetylcysteine (NAC) 500 MG CAPS Take 500 mg by mouth 2 (two) times a day.    [provider]  albuterol (VENTOLIN HFA) 108 (90 Base) MCG/ACT inhaler albuterol sulfate HFA 90 mcg/actuation aerosol inhaler  INHALE 2 PUFFS BY MOUTH EVERY 4 HOURS AS NEEDED    [provider]  ascorbic acid (VITAMIN C) 1000 MG tablet Vitamin C 1,000 mg tablet  Take 1 tablet twice a day by oral route.    [provider]  atorvastatin (LIPITOR) 20 MG tablet Take 1 tablet (20 mg total) by mouth at bedtime. 04/29/19   Glean Hess, MD  azelastine (ASTELIN) 0.1 % nasal spray azelastine 137 mcg (0.1 %) nasal spray aerosol  PLACE 1 SPRAY INTO BOTH NOSTRILS 2 (TWO) TIMES DAILY 01/02/20   [provider]  calcium carbonate (OSCAL) 1500 (600 Ca) MG TABS tablet Calcium 600 mg calcium (1,500 mg) tablet  Take 1 tablet 3 times a day by oral route.    [provider]  Calcium Citrate (CITRACAL PO) Take 800 mg by mouth. Takes 2 ('400mg'$ ) tablets BID    [provider]  Cholecalciferol 100 MCG (4000 UT) CAPS Take 3 capsules by mouth.    [provider]  clobetasol (TEMOVATE) 0.05 % external solution Apply topically as directed. Qd to aa itchy rash until clear, avoid face, groin, axilla 05/07/19   Brendolyn Patty, MD  Cyanocobalamin  (VITAMIN B-12) 1000 MCG SUBL Place 1,000 mcg under the tongue.     [provider]  diclofenac sodium (VOLTAREN) 1 % GEL Apply small amount (2 grams) topically over knees or hands up to four times a day if needed for arthritis 11/26/18   Crecencio Mc, MD  famotidine (PEPCID) 20 MG tablet Take 1 tablet (20 mg total) by mouth 2 (two) times daily as needed for heartburn or indigestion. 11/26/18   Crecencio Mc, MD  ferrous sulfate 325 (65 FE) MG tablet Take 1 tablet (325 mg total) by mouth daily with breakfast. 04/29/19   Glean Hess, MD  fluconazole (DIFLUCAN) 200 MG tablet Take 200 mg by mouth as needed.  02/04/19   [provider]  gabapentin (NEURONTIN) 100 MG capsule Take 100 mg by mouth 3 (three) times daily. 03/25/20   [provider]  hydrALAZINE (APRESOLINE) 25 MG tablet Take 1 tablet (25 mg total) by mouth 3 (three) times daily  as needed (for pressure >150). 04/19/20   Minna Merritts, MD  HYDROcodone-acetaminophen (NORCO/VICODIN) 5-325 MG tablet Take 1 tablet by mouth every 6 (six) hours as needed. 04/19/20   [provider]  hydrOXYzine (ATARAX/VISTARIL) 10 MG tablet TAKE 1 TABLET BY MOUTH EVERY 8 HOURS AS NEEDED 06/04/19   Glean Hess, MD  Icosapent Ethyl (VASCEPA) 1 g CAPS TAKE 2 CAPSULES BY MOUTH TWICE DAILY (TO HELP WITH TRIGLYCERIDES) 11/26/18   Crecencio Mc, MD  ivermectin (STROMECTOL) 3 MG TABS tablet TAKE FIVE TABLETS BY MOUTH ON AN EMPTY STOMACH. REPEAT IN ONE WEEK. [place] BEDDING, TOWELS, WORN CLOTHES IN HOT WATER AFTER EACH DOSE 07/08/19   Brendolyn Patty, MD  ketoconazole (NIZORAL) 2 % cream  02/04/19   [provider]  Lactulose 20 GM/30ML SOLN 30 ml every 4 hours until constipation is relieved 04/29/19   Glean Hess, MD  levalbuterol Penne Lash) 0.63 MG/3ML nebulizer solution Take 3 mLs (0.63 mg total) by nebulization every 4 (four) hours as needed for wheezing or shortness of breath. 04/29/19   Glean Hess, MD   levothyroxine (SYNTHROID) 100 MCG tablet Take 100 mcg by mouth daily before breakfast.    [provider]  Lidocaine 4 % PTCH Aspercreme (lidocaine) 4 % topical patch    [provider]  linaclotide (LINZESS) 290 MCG CAPS capsule Take 1 capsule (290 mcg total) by mouth daily before breakfast. 12/26/17   Clapacs, Madie Reno, MD  LORazepam (ATIVAN) 0.5 MG tablet TAKE 1 TABLET BY MOUTH AS NEEDED ONCE DAILY 02/06/18   [provider]  losartan (COZAAR) 50 MG tablet TAKE 2 TABLETS (100 MG TOTAL) BY MOUTH DAILY. 07/26/20   Minna Merritts, MD  Magnesium 500 MG TABS Take 500 mg by mouth every other day.    [provider]  mupirocin ointment (BACTROBAN) 2 % Apply 1 application topically 3 (three) times daily. 02/04/19   [provider]  neomycin-bacitracin-polymyxin (NEOSPORIN) ointment Apply topically. 03/11/20   [provider]  ofloxacin (FLOXIN) 0.3 % OTIC solution ofloxacin 0.3 % ear drops    [provider]  ofloxacin (OCUFLOX) 0.3 % ophthalmic solution Apply to eye. 11/13/19   [provider]  ondansetron (ZOFRAN) 4 MG tablet ondansetron HCl 4 mg tablet    [provider]  PARoxetine (PAXIL) 20 MG tablet Take 20 mg by mouth daily.    [provider]  polyethylene glycol-electrolytes (NULYTELY) 420 g solution peg-electrolyte solution 420 gram oral solution    [provider]  prednisoLONE acetate (PRED FORTE) 1 % ophthalmic suspension INSTILL 1 DROP INTO EACH EYE TWICE DAILY 07/26/18   [provider]  PREMARIN vaginal cream INSERT ONE APPLICATORFUL VAGINALLY TWICE A WEEK. 05/04/21   Rubie Maid, MD  promethazine (PHENERGAN) 12.5 MG tablet Take 12.5 mg by mouth every 6 (six) hours as needed for nausea or vomiting.    [provider]  pyridoxine (B-6) 100 MG tablet Take 1 tablet (100 mg total) by mouth 2 (two) times daily. 12/26/17   Clapacs, Madie Reno, MD  REFRESH 1.4-0.6 % SOLN Apply to eye.  03/17/20   [provider]  Senna-Natural Laxatives (SENOKOT LAXATIVE) Tea Bag MISC Drink 1 cup daily as needed for constipation. Adding 77m of Lactulose to tea.    [provider]  terconazole (TERAZOL 7) 0.4 % vaginal cream terconazole 0.4 % vaginal cream    [provider]  tiZANidine (ZANAFLEX) 2 MG tablet Take by mouth every 6 (six) hours  as needed for muscle spasms.    [provider]  triamcinolone ointment (KENALOG) 0.5 % Apply 1 application topically 2 (two) times daily. 11/26/18   Crecencio Mc, MD  umeclidinium bromide (INCRUSE ELLIPTA) 62.5 MCG/INH AEPB Inhale 1 puff into the lungs daily. 11/26/18   Crecencio Mc, MD  Vitamin D, Ergocalciferol, (DRISDOL) 1.25 MG (50000 UNIT) CAPS capsule Take 1 capsule (50,000 Units total) by mouth every 7 (seven) days. 05/19/19   Glean Hess, MD  XIIDRA 5 % SOLN INSTILL 1 DROP INTO EACH EYE TWICE DAILY 07/27/18   [provider]  traZODone (DESYREL) 50 MG tablet Take 1 tablet (50 mg total) by mouth at bedtime as needed. Pt takes 1/2 tablet qhs 11/26/18 02/25/19  Crecencio Mc, MD     Allergies  Amoxicillin-pot clavulanate, Aspirin, Bee venom, Cephalexin, Dicyclomine, Fluticasone-salmeterol, Haloperidol, Molds & smuts, Other, Oxycodone-acetaminophen, Peanut butter flavor, Peanuts [peanut oil], Penicillin g, Penicillins, Propoxyphene, Sulfa antibiotics, Sulfacetamide sodium, Tramadol, Amikacin, Bee pollen, Imipramine, Prednisone, Sulfur, Azithromycin, Doxycycline, Elemental sulfur, Fluticasone, Honey bee venom, Hymenoptera venom preparations, Lactose, Lactose intolerance (gi), Peanut-containing drug products, Phthalylsulfacetamide, Pregabalin, Risperidone, Sulfacetamide, Wound dressing adhesive, Adhesive [tape], Albuterol, Ciprofloxacin, and Hydroxyzine   Family History   Family History  Problem Relation Age of Onset   Heart disease Father    Hearing loss Father    COPD Father    Depression Father     Stroke Father    Vision loss Father    Varicose Veins Father    Cancer Mother        lung   Depression Sister    Arthritis Sister    Arthritis Brother    Hernia Brother    Anxiety disorder Brother    Arthritis Brother    Hernia Brother    Anxiety disorder Brother    Urolithiasis Neg Hx    Kidney disease Neg Hx    Kidney cancer Neg Hx    Prostate cancer Neg Hx    Breast cancer Neg Hx      Physical Exam  Triage Vital Signs: ED Triage Vitals  Enc Vitals Group     BP 09/10/21 0107 (!) 156/76     Pulse Rate 09/10/21 0107 67     Resp 09/10/21 0107 16     Temp 09/10/21 0107 98.7 F (37.1 C)     Temp Source 09/10/21 0107 Oral     SpO2 09/10/21 0107 99 %     Weight 09/10/21 0108 145 lb (65.8 kg)     Height 09/10/21 0108 '5\' 3"'$  (1.6 m)     Head Circumference --      Peak Flow --      Pain Score 09/10/21 0107 9     Pain Loc --      Pain Edu? --      Excl. in [place]? --     Updated Vital Signs: BP (!) 156/76 (BP Location: Left Arm)   Pulse 67   Temp 98.7 F (37.1 C) (Oral)   Resp 16   Ht '5\' 3"'$  (1.6 m)   Wt 65.8 kg   SpO2 99%   BMI 25.69 kg/m    General: Awake, no distress.  CV:  RRR.  Good peripheral perfusion.  Resp:  Normal effort.  CTA B. Abd:  Nontender.  No distention.  Other:  Flat affect.  Not agitated.  Ambulates slowly with walker.  Left foot in boot; patient states she is secondary to "contusion".   ED Results /  Procedures / Treatments  Labs (all labs ordered are listed, but only abnormal results are displayed) Labs Reviewed  SALICYLATE LEVEL - Abnormal; Notable for the following components:      Result Value   Salicylate Lvl <6.6 (*)    All other components within normal limits  ACETAMINOPHEN LEVEL - Abnormal; Notable for the following components:   Acetaminophen (Tylenol), Serum <10 (*)    All other components within normal limits  COMPREHENSIVE METABOLIC PANEL  ETHANOL  CBC  URINE DRUG SCREEN, QUALITATIVE ([facility] ONLY)      EKG  None   RADIOLOGY None   Official radiology report(s): No results found.   PROCEDURES:  Critical Care performed: No  Procedures   MEDICATIONS ORDERED IN ED: Medications  Pedialyte solution SOLN 1,000 mL (1,000 mLs Oral Given 09/10/21 0513)     IMPRESSION / MDM / ASSESSMENT AND PLAN / ED COURSE  I reviewed the triage vital signs and the nursing notes.                             71 year old female with a history of bipolar disorder who presents voluntarily to the ED with vague SI without plan and homelessness. The patient has been placed in psychiatric observation due to the need to provide a safe environment for the patient while obtaining psychiatric consultation and evaluation, as well as ongoing medical and medication management to treat the patient's condition.  The patient has not been placed under full IVC at this time.  I have personally reviewed patient's records and note a podiatry office visit on 08/19/2021.   Patient's presentation is most consistent with exacerbation of chronic illness.  Laboratory results unremarkable.  Patient declines chest x-ray.  She is medically cleared at this time for psychiatric evaluation and disposition.  Clinical Course as of 09/10/21 2947  Sat Sep 10, 2021  0450 Patient has been evaluated by psychiatric NP who recommends admission. [JS]  [place] tech unable to reconcile patient's medications overnight as it seems she uses several pharmacies.  Daytime pharmacy tech to call pharmacy to reconcile her medications. [JS]    Clinical Course User Index [JS] Paulette Blanch, MD     FINAL CLINICAL IMPRESSION(S) / ED DIAGNOSES   Final diagnoses:  Bipolar affective disorder, currently depressed, moderate ([place])  Homeless     Rx / DC Orders   ED Discharge Orders     None        Note:  This document was prepared using Dragon voice recognition software and may include unintentional dictation errors.   Paulette Blanch, MD 09/10/21 6546    Paulette Blanch, MD 09/10/21 5035    Paulette Blanch, MD 09/10/21 [phone]

## 2021-09-10 NOTE — ED Notes (Signed)
Pt was dressed out by this EDT.  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

## 2021-09-10 NOTE — BH Assessment (Signed)
Referral information for Psychiatric Hospitalization faxed to;   [facility] [phone]),   [facility] [phone]) [phone]  [phone],  [phone],   [facility]([phone]- [phone],   [phone]  [facility]([phone],

## 2021-09-10 NOTE — ED Notes (Signed)
TTS and Psych NP speaking with patient.

## 2021-09-10 NOTE — Discharge Instructions (Addendum)
Return to the ER for new, worsening, or persistent severe symptoms including worsening depression, thoughts of wanting to harm yourself or others, or any new or acute medical complaints.  You may return at any time if you change your mind and want to discuss further with the social worker about possible assisted living placement.

## 2021-09-10 NOTE — ED Notes (Signed)
Dr. Beather Arbour speaking with patient.  Patient had multiple complaints that she would like MD to address.

## 2021-09-10 NOTE — ED Provider Notes (Signed)
-----------------------------------------   1:47 PM on 09/10/2021 -----------------------------------------   Patient was cleared by the psychiatry team earlier today and [facility] consult was ordered.  The social worker evaluated the patient and states that the patient declined assisted living placement so was provided with shelter resources.  The patient stated to the social worker that she came here to get help and was not getting it, so I had an extensive discussion with her.  When asked specifically what type of help she was hoping to get, the patient talked about her homelessness and chronic social issues, which appear to be her main concern.  When I explained that these were social and not medical or psychiatric concerns, she then mentioned several chronic medical issues (including a longastanding hernia), which I explained do not require inpatient management.  She reports feeling depressed about her social situation but appears offended when asked about suicidal ideation and denies any intent to harm herself; she states that she is trying to help herself.  I also talked her about the social work evaluation and she states that she declines assisted living because she will have to turn over her Social Security checks and would not subsequently be able to live independently.  The patient appears fixated about financial resources that are possibly not being released to her, however it appears from her social situation so for that she does have some resources and has been able to navigate her social and medical situations.    Throughout my discussion with her, although somewhat labile, intermittently raising her voice at me, and though claiming multiple prior careers and academic associations, the patient is overall organized in her thought, coherent, and does not appear delusional or psychotic.  I then independently discussed the patient's case with both case manager Daniel Nones from [facility] and NP Lord from  psychiatry and confirmed their discussions and findings regarding the patient.  At this time, there is no medical indication for admission.  There is also no psychiatric indication for admission based on psychiatry team evaluation.  The patient does not demonstrate acute danger to self or others.  Since she has declined the offered services TOC.  Therefore, she will be discharged.  I gave her return precautions and she has been given a Warden/ranger for shelters and social services.    Arta Silence, MD 09/10/21 [phone]

## 2021-09-10 NOTE — ED Notes (Addendum)
Pt dressed out by this EDT  Pt Belongings:  Stripped dress Black tennis shoe (wearing to quad) Secondary school teacher (wearing to quad) Orange purse Purple bag Stripped bag Pink sweater  Stripped jacket Red walker Green walker pouch White water bottle [employer] and white zip up jacket [employer]

## 2021-09-12 DIAGNOSIS — M76821 Posterior tibial tendinitis, right leg: Secondary | ICD-10-CM | POA: Diagnosis not present

## 2021-09-12 DIAGNOSIS — M722 Plantar fascial fibromatosis: Secondary | ICD-10-CM | POA: Diagnosis not present

## 2021-09-12 DIAGNOSIS — M79671 Pain in right foot: Secondary | ICD-10-CM | POA: Diagnosis not present

## 2021-09-12 DIAGNOSIS — M79672 Pain in left foot: Secondary | ICD-10-CM | POA: Diagnosis not present

## 2021-09-13 ENCOUNTER — Ambulatory Visit: Payer: Medicare Other | Admitting: Occupational Therapy

## 2021-09-13 ENCOUNTER — Encounter: Payer: Medicare Other | Admitting: Physical Therapy

## 2021-09-14 DIAGNOSIS — Z77120 Contact with and (suspected) exposure to mold (toxic): Secondary | ICD-10-CM | POA: Diagnosis not present

## 2021-09-14 DIAGNOSIS — R0602 Shortness of breath: Secondary | ICD-10-CM | POA: Diagnosis not present

## 2021-09-14 DIAGNOSIS — J441 Chronic obstructive pulmonary disease with (acute) exacerbation: Secondary | ICD-10-CM | POA: Diagnosis not present

## 2021-09-15 ENCOUNTER — Ambulatory Visit: Payer: Medicare Other | Admitting: Occupational Therapy

## 2021-09-20 ENCOUNTER — Encounter: Payer: Medicare Other | Admitting: Physical Therapy

## 2021-09-20 DIAGNOSIS — G479 Sleep disorder, unspecified: Secondary | ICD-10-CM | POA: Diagnosis not present

## 2021-09-20 DIAGNOSIS — R262 Difficulty in walking, not elsewhere classified: Secondary | ICD-10-CM | POA: Diagnosis not present

## 2021-09-20 DIAGNOSIS — Z789 Other specified health status: Secondary | ICD-10-CM | POA: Diagnosis not present

## 2021-09-20 DIAGNOSIS — R296 Repeated falls: Secondary | ICD-10-CM | POA: Diagnosis not present

## 2021-09-20 DIAGNOSIS — E039 Hypothyroidism, unspecified: Secondary | ICD-10-CM | POA: Diagnosis not present

## 2021-09-20 DIAGNOSIS — Z77120 Contact with and (suspected) exposure to mold (toxic): Secondary | ICD-10-CM | POA: Diagnosis not present

## 2021-09-20 DIAGNOSIS — J4541 Moderate persistent asthma with (acute) exacerbation: Secondary | ICD-10-CM | POA: Diagnosis not present

## 2021-09-20 DIAGNOSIS — R627 Adult failure to thrive: Secondary | ICD-10-CM | POA: Diagnosis not present

## 2021-09-22 ENCOUNTER — Encounter: Payer: Medicare Other | Admitting: Physical Therapy

## 2021-09-27 ENCOUNTER — Encounter (INDEPENDENT_AMBULATORY_CARE_PROVIDER_SITE_OTHER): Payer: Medicare Other | Admitting: Vascular Surgery

## 2021-10-04 DIAGNOSIS — J301 Allergic rhinitis due to pollen: Secondary | ICD-10-CM | POA: Diagnosis not present

## 2021-10-04 DIAGNOSIS — R490 Dysphonia: Secondary | ICD-10-CM | POA: Diagnosis not present

## 2021-10-06 DIAGNOSIS — Z789 Other specified health status: Secondary | ICD-10-CM | POA: Diagnosis not present

## 2021-10-06 DIAGNOSIS — Z77120 Contact with and (suspected) exposure to mold (toxic): Secondary | ICD-10-CM | POA: Diagnosis not present

## 2021-10-06 DIAGNOSIS — R627 Adult failure to thrive: Secondary | ICD-10-CM | POA: Diagnosis not present

## 2021-10-06 DIAGNOSIS — R262 Difficulty in walking, not elsewhere classified: Secondary | ICD-10-CM | POA: Diagnosis not present

## 2021-10-06 DIAGNOSIS — G479 Sleep disorder, unspecified: Secondary | ICD-10-CM | POA: Diagnosis not present

## 2021-10-06 DIAGNOSIS — J4541 Moderate persistent asthma with (acute) exacerbation: Secondary | ICD-10-CM | POA: Diagnosis not present

## 2021-10-06 DIAGNOSIS — E039 Hypothyroidism, unspecified: Secondary | ICD-10-CM | POA: Diagnosis not present

## 2021-10-06 DIAGNOSIS — R296 Repeated falls: Secondary | ICD-10-CM | POA: Diagnosis not present

## 2021-10-17 DIAGNOSIS — X58XXXA Exposure to other specified factors, initial encounter: Secondary | ICD-10-CM | POA: Diagnosis not present

## 2021-10-17 DIAGNOSIS — M3501 Sicca syndrome with keratoconjunctivitis: Secondary | ICD-10-CM | POA: Diagnosis not present

## 2021-10-17 DIAGNOSIS — S0502XA Injury of conjunctiva and corneal abrasion without foreign body, left eye, initial encounter: Secondary | ICD-10-CM | POA: Diagnosis not present

## 2021-10-20 DIAGNOSIS — Z59819 Housing instability, housed unspecified: Secondary | ICD-10-CM | POA: Diagnosis not present

## 2021-10-20 DIAGNOSIS — G2581 Restless legs syndrome: Secondary | ICD-10-CM | POA: Diagnosis not present

## 2021-10-20 DIAGNOSIS — G479 Sleep disorder, unspecified: Secondary | ICD-10-CM | POA: Diagnosis not present

## 2021-10-20 DIAGNOSIS — K589 Irritable bowel syndrome without diarrhea: Secondary | ICD-10-CM | POA: Diagnosis not present

## 2021-10-21 DIAGNOSIS — B359 Dermatophytosis, unspecified: Secondary | ICD-10-CM | POA: Diagnosis not present

## 2021-10-21 DIAGNOSIS — R262 Difficulty in walking, not elsewhere classified: Secondary | ICD-10-CM | POA: Diagnosis not present

## 2021-10-21 DIAGNOSIS — R292 Abnormal reflex: Secondary | ICD-10-CM | POA: Diagnosis not present

## 2021-10-27 DIAGNOSIS — I10 Essential (primary) hypertension: Secondary | ICD-10-CM | POA: Diagnosis not present

## 2021-10-27 DIAGNOSIS — B359 Dermatophytosis, unspecified: Secondary | ICD-10-CM | POA: Diagnosis not present

## 2021-10-27 DIAGNOSIS — G479 Sleep disorder, unspecified: Secondary | ICD-10-CM | POA: Diagnosis not present

## 2021-10-27 DIAGNOSIS — K589 Irritable bowel syndrome without diarrhea: Secondary | ICD-10-CM | POA: Diagnosis not present

## 2021-10-27 DIAGNOSIS — Z59819 Housing instability, housed unspecified: Secondary | ICD-10-CM | POA: Diagnosis not present

## 2021-10-30 DIAGNOSIS — Z59819 Housing instability, housed unspecified: Secondary | ICD-10-CM | POA: Diagnosis not present

## 2021-10-30 DIAGNOSIS — Z20822 Contact with and (suspected) exposure to covid-19: Secondary | ICD-10-CM | POA: Diagnosis not present

## 2021-10-30 DIAGNOSIS — R0981 Nasal congestion: Secondary | ICD-10-CM | POA: Diagnosis not present

## 2021-11-14 ENCOUNTER — Encounter (INDEPENDENT_AMBULATORY_CARE_PROVIDER_SITE_OTHER): Payer: Self-pay

## 2021-11-14 DIAGNOSIS — E559 Vitamin D deficiency, unspecified: Secondary | ICD-10-CM | POA: Diagnosis not present

## 2021-11-14 DIAGNOSIS — J449 Chronic obstructive pulmonary disease, unspecified: Secondary | ICD-10-CM | POA: Diagnosis not present

## 2021-11-14 DIAGNOSIS — J4541 Moderate persistent asthma with (acute) exacerbation: Secondary | ICD-10-CM | POA: Diagnosis not present

## 2021-11-14 DIAGNOSIS — J45909 Unspecified asthma, uncomplicated: Secondary | ICD-10-CM | POA: Diagnosis not present

## 2021-11-14 DIAGNOSIS — K2100 Gastro-esophageal reflux disease with esophagitis, without bleeding: Secondary | ICD-10-CM | POA: Diagnosis not present

## 2021-11-14 DIAGNOSIS — I10 Essential (primary) hypertension: Secondary | ICD-10-CM | POA: Diagnosis not present

## 2021-11-14 DIAGNOSIS — E039 Hypothyroidism, unspecified: Secondary | ICD-10-CM | POA: Diagnosis not present

## 2021-11-14 DIAGNOSIS — G459 Transient cerebral ischemic attack, unspecified: Secondary | ICD-10-CM | POA: Diagnosis not present

## 2021-11-16 DIAGNOSIS — Y33XXXA Other specified events, undetermined intent, initial encounter: Secondary | ICD-10-CM | POA: Diagnosis not present

## 2021-11-16 DIAGNOSIS — I10 Essential (primary) hypertension: Secondary | ICD-10-CM | POA: Diagnosis not present

## 2021-11-16 DIAGNOSIS — Z5181 Encounter for therapeutic drug level monitoring: Secondary | ICD-10-CM | POA: Diagnosis not present

## 2021-11-16 DIAGNOSIS — R531 Weakness: Secondary | ICD-10-CM | POA: Diagnosis not present

## 2021-11-16 DIAGNOSIS — J069 Acute upper respiratory infection, unspecified: Secondary | ICD-10-CM | POA: Diagnosis not present

## 2021-11-16 DIAGNOSIS — K310 Acute dilatation of stomach: Secondary | ICD-10-CM | POA: Diagnosis not present

## 2021-11-16 DIAGNOSIS — R109 Unspecified abdominal pain: Secondary | ICD-10-CM | POA: Diagnosis not present

## 2021-11-16 DIAGNOSIS — S299XXA Unspecified injury of thorax, initial encounter: Secondary | ICD-10-CM | POA: Diagnosis not present

## 2021-11-16 DIAGNOSIS — R1084 Generalized abdominal pain: Secondary | ICD-10-CM | POA: Diagnosis not present

## 2021-11-16 DIAGNOSIS — Z87891 Personal history of nicotine dependence: Secondary | ICD-10-CM | POA: Diagnosis not present

## 2021-11-16 DIAGNOSIS — Z20822 Contact with and (suspected) exposure to covid-19: Secondary | ICD-10-CM | POA: Diagnosis not present

## 2021-11-16 DIAGNOSIS — Z79899 Other long term (current) drug therapy: Secondary | ICD-10-CM | POA: Diagnosis not present

## 2021-11-18 DIAGNOSIS — Z5900 Homelessness unspecified: Secondary | ICD-10-CM | POA: Diagnosis not present

## 2021-11-18 DIAGNOSIS — R109 Unspecified abdominal pain: Secondary | ICD-10-CM | POA: Diagnosis not present

## 2021-11-22 DIAGNOSIS — J019 Acute sinusitis, unspecified: Secondary | ICD-10-CM | POA: Diagnosis not present

## 2021-11-22 DIAGNOSIS — Z20822 Contact with and (suspected) exposure to covid-19: Secondary | ICD-10-CM | POA: Diagnosis not present

## 2021-11-22 DIAGNOSIS — R109 Unspecified abdominal pain: Secondary | ICD-10-CM | POA: Diagnosis not present

## 2021-11-22 DIAGNOSIS — H60542 Acute eczematoid otitis externa, left ear: Secondary | ICD-10-CM | POA: Diagnosis not present

## 2021-11-24 DIAGNOSIS — K219 Gastro-esophageal reflux disease without esophagitis: Secondary | ICD-10-CM | POA: Diagnosis not present

## 2021-11-24 DIAGNOSIS — R197 Diarrhea, unspecified: Secondary | ICD-10-CM | POA: Diagnosis not present

## 2021-11-30 DIAGNOSIS — H43393 Other vitreous opacities, bilateral: Secondary | ICD-10-CM | POA: Diagnosis not present

## 2021-11-30 DIAGNOSIS — H2513 Age-related nuclear cataract, bilateral: Secondary | ICD-10-CM | POA: Diagnosis not present

## 2021-11-30 DIAGNOSIS — H04123 Dry eye syndrome of bilateral lacrimal glands: Secondary | ICD-10-CM | POA: Diagnosis not present

## 2021-11-30 DIAGNOSIS — M3501 Sicca syndrome with keratoconjunctivitis: Secondary | ICD-10-CM | POA: Diagnosis not present

## 2021-12-01 DIAGNOSIS — J4541 Moderate persistent asthma with (acute) exacerbation: Secondary | ICD-10-CM | POA: Diagnosis not present

## 2021-12-01 DIAGNOSIS — I10 Essential (primary) hypertension: Secondary | ICD-10-CM | POA: Diagnosis not present

## 2021-12-01 DIAGNOSIS — G459 Transient cerebral ischemic attack, unspecified: Secondary | ICD-10-CM | POA: Diagnosis not present

## 2021-12-01 DIAGNOSIS — J449 Chronic obstructive pulmonary disease, unspecified: Secondary | ICD-10-CM | POA: Diagnosis not present

## 2021-12-01 DIAGNOSIS — J45909 Unspecified asthma, uncomplicated: Secondary | ICD-10-CM | POA: Diagnosis not present

## 2021-12-01 DIAGNOSIS — E039 Hypothyroidism, unspecified: Secondary | ICD-10-CM | POA: Diagnosis not present

## 2021-12-07 DIAGNOSIS — B9689 Other specified bacterial agents as the cause of diseases classified elsewhere: Secondary | ICD-10-CM | POA: Diagnosis not present

## 2021-12-07 DIAGNOSIS — E559 Vitamin D deficiency, unspecified: Secondary | ICD-10-CM | POA: Diagnosis not present

## 2021-12-07 DIAGNOSIS — I10 Essential (primary) hypertension: Secondary | ICD-10-CM | POA: Diagnosis not present

## 2021-12-07 DIAGNOSIS — R109 Unspecified abdominal pain: Secondary | ICD-10-CM | POA: Diagnosis not present

## 2021-12-07 DIAGNOSIS — J329 Chronic sinusitis, unspecified: Secondary | ICD-10-CM | POA: Diagnosis not present

## 2021-12-07 DIAGNOSIS — E039 Hypothyroidism, unspecified: Secondary | ICD-10-CM | POA: Diagnosis not present

## 2021-12-14 DIAGNOSIS — L853 Xerosis cutis: Secondary | ICD-10-CM | POA: Diagnosis not present

## 2021-12-14 DIAGNOSIS — B372 Candidiasis of skin and nail: Secondary | ICD-10-CM | POA: Diagnosis not present

## 2021-12-14 DIAGNOSIS — L304 Erythema intertrigo: Secondary | ICD-10-CM | POA: Diagnosis not present

## 2021-12-29 DIAGNOSIS — B9789 Other viral agents as the cause of diseases classified elsewhere: Secondary | ICD-10-CM | POA: Diagnosis not present

## 2021-12-29 DIAGNOSIS — R6883 Chills (without fever): Secondary | ICD-10-CM | POA: Diagnosis not present

## 2021-12-29 DIAGNOSIS — R519 Headache, unspecified: Secondary | ICD-10-CM | POA: Diagnosis not present

## 2021-12-29 DIAGNOSIS — R0981 Nasal congestion: Secondary | ICD-10-CM | POA: Diagnosis not present

## 2021-12-29 DIAGNOSIS — J329 Chronic sinusitis, unspecified: Secondary | ICD-10-CM | POA: Diagnosis not present

## 2021-12-29 DIAGNOSIS — J309 Allergic rhinitis, unspecified: Secondary | ICD-10-CM | POA: Diagnosis not present

## 2022-01-03 ENCOUNTER — Encounter (INDEPENDENT_AMBULATORY_CARE_PROVIDER_SITE_OTHER): Payer: Medicare Other | Admitting: Vascular Surgery

## 2022-01-05 DIAGNOSIS — J449 Chronic obstructive pulmonary disease, unspecified: Secondary | ICD-10-CM | POA: Diagnosis not present

## 2022-01-18 DIAGNOSIS — J439 Emphysema, unspecified: Secondary | ICD-10-CM | POA: Diagnosis not present

## 2022-01-18 DIAGNOSIS — R0602 Shortness of breath: Secondary | ICD-10-CM | POA: Diagnosis not present

## 2022-02-14 DIAGNOSIS — S0501XA Injury of conjunctiva and corneal abrasion without foreign body, right eye, initial encounter: Secondary | ICD-10-CM | POA: Diagnosis not present

## 2022-02-14 DIAGNOSIS — X58XXXA Exposure to other specified factors, initial encounter: Secondary | ICD-10-CM | POA: Diagnosis not present

## 2022-02-17 ENCOUNTER — Encounter (INDEPENDENT_AMBULATORY_CARE_PROVIDER_SITE_OTHER): Payer: Medicare Other | Admitting: Vascular Surgery

## 2022-12-27 ENCOUNTER — Emergency Department
Admission: EM | Admit: 2022-12-27 | Discharge: 2022-12-27 | Disposition: A | Discharge status: Home / Self Care | Payer: Medicare HMO

## 2022-12-27 NOTE — ED Triage Notes (Signed)
Attempted to triage pt brought in by EMS for shob. Pt came from police Dept where spent the night outside d/t homeless. Police brought over multiple bags and suitcases of pt belongings. While triaging, pt told this RN to be quiet while EMS in room. Then proceeded to tell RN she didn't want to check in to ED but really needed to get to appt nextdoor at 1300 and police would not take her. This RN was leaving triage room to speak to PD and pt grabbed this RN around waist in attempt to stop from leaving room.  Per PD, PD did not refuse ride to appt. Pt came out to lobby and states is leaving. Police left pt belongings outside lobby with pt.

## 2023-01-01 ENCOUNTER — Ambulatory Visit: Payer: Medicare HMO | Admitting: Podiatry

## 2023-01-03 ENCOUNTER — Ambulatory Visit: Payer: Medicare HMO | Admitting: Podiatry

## 2023-01-15 ENCOUNTER — Ambulatory Visit
Admission: EM | Admit: 2023-01-15 | Discharge: 2023-01-15 | Disposition: A | Discharge status: Home / Self Care | Payer: Medicare HMO | Attending: Emergency Medicine | Admitting: Emergency Medicine

## 2023-01-15 ENCOUNTER — Encounter: Payer: Self-pay | Admitting: *Deleted

## 2023-01-15 DIAGNOSIS — M79675 Pain in left toe(s): Secondary | ICD-10-CM | POA: Diagnosis present

## 2023-01-15 DIAGNOSIS — N898 Other specified noninflammatory disorders of vagina: Secondary | ICD-10-CM | POA: Diagnosis present

## 2023-01-15 DIAGNOSIS — F329 Major depressive disorder, single episode, unspecified: Secondary | ICD-10-CM | POA: Diagnosis not present

## 2023-01-15 DIAGNOSIS — Z760 Encounter for issue of repeat prescription: Secondary | ICD-10-CM | POA: Insufficient documentation

## 2023-01-15 LAB — POCT URINALYSIS DIP (MANUAL ENTRY)
Bilirubin, UA: NEGATIVE
Blood, UA: NEGATIVE
Glucose, UA: NEGATIVE mg/dL
Ketones, POC UA: NEGATIVE mg/dL
Leukocytes, UA: NEGATIVE
Nitrite, UA: NEGATIVE
Protein Ur, POC: NEGATIVE mg/dL
Spec Grav, UA: 1.015
Urobilinogen, UA: 0.2 U/dL
pH, UA: 7

## 2023-01-15 MED ORDER — COLCHICINE 0.6 MG PO TABS
ORAL_TABLET | ORAL | 0 refills | Status: DC
Start: 2023-01-15 — End: 2023-07-13

## 2023-01-15 MED ORDER — TRAZODONE HCL 50 MG PO TABS: 25.0000 mg | ORAL_TABLET | Freq: Every day | ORAL | 1 refills | Status: DC

## 2023-01-15 MED ORDER — MICONAZOLE NITRATE 200 MG VA SUPP: 200.0000 mg | Freq: Every day | VAGINAL | 0 refills | Status: DC

## 2023-01-15 MED ORDER — PAROXETINE HCL 20 MG PO TABS: 20.0000 mg | ORAL_TABLET | Freq: Every day | ORAL | 1 refills | Status: DC

## 2023-01-15 MED ORDER — NYSTATIN 100000 UNIT/GM EX POWD: 1.0000 | Freq: Three times a day (TID) | CUTANEOUS | 0 refills | Status: DC

## 2023-01-15 MED ORDER — FLUCONAZOLE 150 MG PO TABS: 150.0000 mg | ORAL_TABLET | Freq: Every day | ORAL | 0 refills | Status: DC

## 2023-01-15 NOTE — ED Provider Notes (Signed)
[name]    CSN: [id number] Arrival date & time: 01/15/23  1431      History   Chief Complaint Chief Complaint  Patient presents with   Vaginal Discharge   Medication Refill    HPI Angela [name] is a 72 y.o. female.   Patient presents for evaluation of vaginal discharge with odor present underneath the bilateral breasts into the vagina.  Has not attempted treatment.  Endorses sleeping outside several times.  Denies urinary symptoms.  Patient endorses bilateral toe pain present for approximately 1 month, worse over the last 3 days.  Endorses believes related to her shoes as they are poor fitting.  She was seen in the emergency department for similar symptoms and was given a medicine, unable to recall name.  Endorses that initial redness has resolved but pain and swelling are still prominent.  Has attempted to make dietary changes.  Requesting refill of Paxil and trazodone, has been without medicine for 3 to 4 days, has been taking daily for 20 years.     Past Medical History:  Diagnosis Date   Agitation 09/18/2017   Allergic rhinitis    Altered mental status, unspecified 12/25/2018   Anemia    Blurred vision    Chest pain of uncertain etiology 05/31/2018   Confusion state 12/25/2018   COPD (chronic obstructive pulmonary disease) (HCC)    Depression    Diverticulosis    Endometriosis    Falls    GERD (gastroesophageal reflux disease)    Hernia, hiatal    Hypothyroidism    IBS (irritable bowel syndrome)    Labile mood    Malignant neoplasm of skin    Malnutrition of mild degree (HCC) 08/08/2018   Migraine    Neuropathy    Noncompliance 12/25/2017   PTSD (post-traumatic stress disorder)    SIADH (syndrome of inappropriate ADH production) (HCC) 09/08/2016   Thyroid disease     Patient Active Problem List   Diagnosis Date Noted   Stress reaction causing mixed disturbance of emotion and conduct 09/10/2021   Homeless    Mixed hyperlipidemia  07/02/2019   Hypothyroidism due to acquired atrophy of thyroid 04/29/2019   Osteopenia determined by x-ray 04/29/2019   Acquired claw toe of both feet 10/23/2018   Aortic atherosclerosis (HCC) 05/31/2018   Achilles tendon disorder 04/25/2018   Diastolic dysfunction 03/29/2018   Mitral regurgitation 03/29/2018   Personality disorder (HCC) 12/25/2017   Arthritis of hand, degenerative 12/07/2017   Hallux valgus, acquired 07/05/2017   Hammer toes of both feet 07/05/2017   Unstable ankle 07/05/2017   Tinea unguium 05/08/2017   Conductive hearing loss of right ear with unrestricted hearing of left ear 04/03/2017   Adjustment disorder with depressed mood 01/18/2017   Migraine 01/18/2017   Irritable bowel syndrome with constipation 11/17/2016   Bilateral carpal tunnel syndrome 11/17/2016   Plantar fasciitis, bilateral 11/17/2016   Cervical myofascial pain syndrome 11/17/2016   Chronic hyponatremia 11/17/2016   PTSD (post-traumatic stress disorder) 11/17/2016   Hiatal hernia 11/17/2016   Vitamin D deficiency 11/17/2016   Vitamin B12 deficiency 11/17/2016   Cervical spondylosis with myelopathy 05/24/2016   MCI (mild cognitive impairment) with memory loss 03/30/2016   Gait abnormality 03/30/2016   Chronic bipolar affective disorder (HCC) 01/24/2016   Anemia, iron deficiency 06/24/2015   Benign essential HTN 06/24/2015   History of falling 05/22/2015   History of TIA (transient ischemic attack) 04/20/2015   Gastroesophageal reflux disease with hiatal hernia 04/20/2015   Cervical  disc disorder at C5-C6 level with radiculopathy 03/25/2015   Chronic neck pain 03/25/2015   Pelvic pain in female 10/30/2014   History of skin cancer 06/30/2014   Chronic headaches 12/16/2013   Neuropathy 12/16/2013    Past Surgical History:  Procedure Laterality Date   APPENDECTOMY     CHOLECYSTECTOMY     CHOLECYSTECTOMY, LAPAROSCOPIC     ESOPHAGOGASTRODUODENOSCOPY (EGD) WITH PROPOFOL N/A 03/29/2015    Procedure: ESOPHAGOGASTRODUODENOSCOPY (EGD) WITH PROPOFOL;  Surgeon: Elnita Maxwell, MD;  Location: [facility] ENDOSCOPY;  Service: Endoscopy;  Laterality: N/A;   HERNIA REPAIR     laproscopy     TONSILLECTOMY     uteral suspension      OB History     Gravida  1   Para      Term      Preterm      AB  1   Living  0      SAB      IAB  1   Ectopic      Multiple      Live Births               Home Medications    Prior to Admission medications   Medication Sig Start Date End Date Taking? Authorizing Provider  colchicine 0.6 MG tablet Take 2 tablets ( 1.2 mg ) then in 1 hour take 1 tablet ( 0.6 mg),  then take 1 tablet ( 0.6 mg ) daily for 6 days 01/15/23  Yes Monica Codd, Hansel Starling R, NP  fluconazole (DIFLUCAN) 150 MG tablet Take 1 tablet (150 mg total) by mouth daily for 2 doses. 01/15/23 01/17/23 Yes Adelfo Diebel R, NP  nystatin (MYCOSTATIN/NYSTOP) powder Apply 1 Application topically 3 (three) times daily. 01/15/23  Yes Audi Conover R, NP  PARoxetine (PAXIL) 20 MG tablet Take 1 tablet (20 mg total) by mouth daily. 01/15/23  Yes Leylani Duley, Elita Boone, NP  traZODone (DESYREL) 50 MG tablet Take 0.5 tablets (25 mg total) by mouth at bedtime. 01/15/23  Yes Jaslynne Dahan, Elita Boone, NP  acetaminophen (TYLENOL) 650 MG CR tablet Take 650 mg by mouth every 8 (eight) hours as needed for pain.    [provider]  Acetylcysteine (NAC) 500 MG CAPS Take 500 mg by mouth 2 (two) times a day.    [provider]  albuterol (VENTOLIN HFA) 108 (90 Base) MCG/ACT inhaler albuterol sulfate HFA 90 mcg/actuation aerosol inhaler  INHALE 2 PUFFS BY MOUTH EVERY 4 HOURS AS NEEDED    [provider]  ascorbic acid (VITAMIN C) 1000 MG tablet Vitamin C 1,000 mg tablet  Take 1 tablet twice a day by oral route.    [provider]  atorvastatin (LIPITOR) 20 MG tablet Take 1 tablet (20 mg total) by mouth at bedtime. Patient not taking: Reported on 09/10/2021 04/29/19   Reubin Milan, MD  azelastine (ASTELIN) 0.1 % nasal spray azelastine 137 mcg (0.1 %) nasal spray aerosol  PLACE 1 SPRAY INTO BOTH NOSTRILS 2 (TWO) TIMES DAILY Patient not taking: Reported on 09/10/2021 01/02/20   [provider]  calcium carbonate (OSCAL) 1500 (600 Ca) MG TABS tablet Calcium 600 mg calcium (1,500 mg) tablet  Take 1 tablet 3 times a day by oral route.    [provider]  Calcium Citrate (CITRACAL PO) Take 800 mg by mouth. Takes 2 (400mg ) tablets BID    [provider]  Cholecalciferol 100 MCG (4000 UT) CAPS Take 3 capsules by mouth.  [provider]  clobetasol (TEMOVATE) 0.05 % external solution Apply topically as directed. Qd to aa itchy rash until clear, avoid face, groin, axilla Patient not taking: Reported on 09/10/2021 05/07/19   Willeen Niece, MD  Cyanocobalamin (VITAMIN B-12) 1000 MCG SUBL Place 1,000 mcg under the tongue.     [provider]  diclofenac sodium (VOLTAREN) 1 % GEL Apply small amount (2 grams) topically over knees or hands up to four times a day if needed for arthritis 11/26/18   Sherlene Shams, MD  famotidine (PEPCID) 20 MG tablet Take 1 tablet (20 mg total) by mouth 2 (two) times daily as needed for heartburn or indigestion. Patient not taking: Reported on 09/10/2021 11/26/18   Sherlene Shams, MD  ferrous sulfate 325 (65 FE) MG tablet Take 1 tablet (325 mg total) by mouth daily with breakfast. Patient not taking: Reported on 09/10/2021 04/29/19   Reubin Milan, MD  gabapentin (NEURONTIN) 100 MG capsule Take 100 mg by mouth 3 (three) times daily. Patient not taking: Reported on 09/10/2021 03/25/20   [provider]  hydrALAZINE (APRESOLINE) 25 MG tablet Take 1 tablet (25 mg total) by mouth 3 (three) times daily as needed (for pressure >150). 04/19/20   Antonieta Iba, MD  HYDROcodone-acetaminophen (NORCO/VICODIN) 5-325 MG tablet Take 1 tablet by mouth every 6 (six) hours as needed. 04/19/20   [provider]  hydrOXYzine (ATARAX/VISTARIL) 10 MG tablet TAKE 1 TABLET BY MOUTH EVERY 8 HOURS AS NEEDED 06/04/19   Reubin Milan, MD  Icosapent Ethyl (VASCEPA) 1 g CAPS TAKE 2 CAPSULES BY MOUTH TWICE DAILY (TO HELP WITH TRIGLYCERIDES) 11/26/18   Sherlene Shams, MD  ivermectin (STROMECTOL) 3 MG TABS tablet TAKE FIVE TABLETS BY MOUTH ON AN EMPTY STOMACH. REPEAT IN ONE WEEK. WASH BEDDING, TOWELS, WORN CLOTHES IN HOT WATER AFTER EACH DOSE Patient not taking: Reported on 09/10/2021 07/08/19   Willeen Niece, MD  ketoconazole (NIZORAL) 2 % cream  02/04/19   [provider]  Lactulose 20 GM/30ML SOLN 30 ml every 4 hours until constipation is relieved 04/29/19   Reubin Milan, MD  levalbuterol Pauline Aus) 0.63 MG/3ML nebulizer solution Take 3 mLs (0.63 mg total) by nebulization every 4 (four) hours as needed for wheezing or shortness of breath. 04/29/19   Reubin Milan, MD  levothyroxine (SYNTHROID) 100 MCG tablet Take 100 mcg by mouth daily before breakfast. Patient not taking: Reported on 09/10/2021    [provider]  levothyroxine (SYNTHROID) 112 MCG tablet Take 112 mcg by mouth daily before breakfast.    [provider]  Lidocaine 4 % PTCH Aspercreme (lidocaine) 4 % topical patch    [provider]  linaclotide (LINZESS) 290 MCG CAPS capsule Take 1 capsule (290 mcg total) by mouth daily before breakfast. Patient not taking: Reported on 09/10/2021 12/26/17   Clapacs, Jackquline Denmark, MD  LORazepam (ATIVAN) 0.5 MG tablet TAKE 1 TABLET BY MOUTH AS NEEDED ONCE DAILY 02/06/18   [provider]  losartan (COZAAR) 50 MG tablet TAKE 2 TABLETS (100 MG TOTAL) BY MOUTH DAILY. Patient not taking: Reported on 09/10/2021 07/26/20   Antonieta Iba, MD  Magnesium 500 MG TABS Take 500 mg by mouth every other day.    [provider]  mupirocin ointment (BACTROBAN) 2 % Apply 1 application topically 3 (three) times daily. 02/04/19   [provider]   neomycin-bacitracin-polymyxin (NEOSPORIN) ointment Apply topically. 03/11/20   [provider]  ofloxacin (FLOXIN) 0.3 %  OTIC solution ofloxacin 0.3 % ear drops    [provider]  ofloxacin (OCUFLOX) 0.3 % ophthalmic solution Apply to eye. 11/13/19   [provider]  ondansetron (ZOFRAN) 4 MG tablet ondansetron HCl 4 mg tablet    [provider]  polyethylene glycol-electrolytes (NULYTELY) 420 g solution peg-electrolyte solution 420 gram oral solution Patient not taking: Reported on 09/10/2021    [provider]  prednisoLONE acetate (PRED FORTE) 1 % ophthalmic suspension INSTILL 1 DROP INTO EACH EYE TWICE DAILY 07/26/18   [provider]  PREMARIN vaginal cream INSERT ONE APPLICATORFUL VAGINALLY TWICE A WEEK. 05/04/21   Hildred Laser, MD  promethazine (PHENERGAN) 12.5 MG tablet Take 12.5 mg by mouth every 6 (six) hours as needed for nausea or vomiting.    [provider]  pyridoxine (B-6) 100 MG tablet Take 1 tablet (100 mg total) by mouth 2 (two) times daily. Patient not taking: Reported on 09/10/2021 12/26/17   Clapacs, Jackquline Denmark, MD  REFRESH 1.4-0.6 % SOLN Apply to eye. 03/17/20   [provider]  Senna-Natural Laxatives (SENOKOT LAXATIVE) Tea Bag MISC Drink 1 cup daily as needed for constipation. Adding 30ml of Lactulose to tea.    [provider]  terconazole (TERAZOL 7) 0.4 % vaginal cream terconazole 0.4 % vaginal cream    [provider]  tiZANidine (ZANAFLEX) 2 MG tablet Take by mouth every 6 (six) hours as needed for muscle spasms. Patient not taking: Reported on 09/10/2021    [provider]  triamcinolone ointment (KENALOG) 0.5 % Apply 1 application topically 2 (two) times daily. Patient not taking: Reported on 09/10/2021 11/26/18   Sherlene Shams, MD  umeclidinium bromide (INCRUSE ELLIPTA) 62.5 MCG/INH AEPB Inhale 1 puff into the lungs daily. Patient not taking: Reported on 09/10/2021 11/26/18    Sherlene Shams, MD  valsartan (DIOVAN) 320 MG tablet Take 320 mg by mouth daily.    [provider]  Vitamin D, Ergocalciferol, (DRISDOL) 1.25 MG (50000 UNIT) CAPS capsule Take 1 capsule (50,000 Units total) by mouth every 7 (seven) days. 05/19/19   Reubin Milan, MD  Benay Spice 5 % SOLN INSTILL 1 DROP INTO EACH EYE TWICE DAILY 07/27/18   [provider]    Family History Family History  Problem Relation Age of Onset   Heart disease Father    Hearing loss Father    COPD Father    Depression Father    Stroke Father    Vision loss Father    Varicose Veins Father    Cancer Mother        lung   Depression Sister    Arthritis Sister    Arthritis Brother    Hernia Brother    Anxiety disorder Brother    Arthritis Brother    Hernia Brother    Anxiety disorder Brother    Urolithiasis Neg Hx    Kidney disease Neg Hx    Kidney cancer Neg Hx    Prostate cancer Neg Hx    Breast cancer Neg Hx     Social History Social History   Tobacco Use   Smoking status: Former    Current packs/day: 0.00    Types: Cigarettes    Quit date: 11/17/1976    Years since quitting: 46.1   Smokeless tobacco: Never  Vaping Use   Vaping status: Never Used  Substance Use Topics   Alcohol use: Yes    Comment: occ   Drug use: No  Allergies   Amoxicillin-pot clavulanate, Aspirin, Bee venom, Cephalexin, Dicyclomine, Fluticasone-salmeterol, Haloperidol, Molds & smuts, Other, Oxycodone-acetaminophen, Peanut butter flavoring agent (non-screening), Peanuts [peanut oil], Penicillin g, Penicillins, Propoxyphene, Sulfa antibiotics, Sulfacetamide sodium, Tramadol, Amikacin, Bee pollen, Imipramine, Prednisone, Sulfur, Azithromycin, Doxycycline, Elemental sulfur, Fluticasone, Honey bee venom, Hymenoptera venom preparations, Lactose, Lactose intolerance (gi), Peanut-containing drug products, Phthalylsulfacetamide, Pregabalin, Risperidone, Sulfacetamide, Wound dressing adhesive, Adhesive [tape],  Albuterol, Ciprofloxacin, and Hydroxyzine   Review of Systems Review of Systems   Physical Exam Triage Vital Signs ED Triage Vitals  Encounter Vitals Group     BP 01/15/23 1558 (!) 155/89     Systolic BP Percentile --      Diastolic BP Percentile --      Pulse Rate 01/15/23 1558 84     Resp 01/15/23 1558 20     Temp 01/15/23 1558 98.6 F (37 C)     Temp src --      SpO2 01/15/23 1558 98 %     Weight --      Height --      Head Circumference --      Peak Flow --      Pain Score 01/15/23 1555 0     Pain Loc --      Pain Education --      Exclude from Growth Chart --    No data found.  Updated Vital Signs BP (!) 155/89   Pulse 84   Temp 98.6 F (37 C)   Resp 20   SpO2 98%   Visual Acuity Right Eye Distance:   Left Eye Distance:   Bilateral Distance:    Right Eye Near:   Left Eye Near:    Bilateral Near:     Physical Exam Constitutional:      Appearance: Normal appearance.  Eyes:     Extraocular Movements: Extraocular movements intact.  Pulmonary:     Effort: Pulmonary effort is normal.  Genitourinary:    Comments: deferred Musculoskeletal:     Comments: Tenderness present to the base of the third metatarsal and to the medial aspect of the left great toe, mild erythema and swelling present, 2+ pedal pulse, sensation intact, able to bear weight, complete range of motion  Skin:    Comments: Erythema under the bilateral breast with a scant amount of Falesha Schommer thick drainage, skin intact  Neurological:     Mental Status: She is alert and oriented to person, place, and time. Mental status is at baseline.      UC Treatments / Results  Labs (all labs ordered are listed, but only abnormal results are displayed) Labs Reviewed  POCT URINALYSIS DIP (MANUAL ENTRY) - Normal  CERVICOVAGINAL ANCILLARY ONLY  CERVICOVAGINAL ANCILLARY ONLY    EKG   Radiology No results found.  Procedures Procedures (including critical care time)  Medications Ordered in  UC Medications - No data to display  Initial Impression / Assessment and Plan / UC Course  I have reviewed the triage vital signs and the nursing notes.  Pertinent labs & imaging results that were available during my care of the patient were reviewed by me and considered in my medical decision making (see chart for details).  Chronic major depression, vaginal discharge, encounter for medication refill, pain of toe of left foot  Refill Paxil and trazodone for 2 months, continue to take as directed without missing dosages  Urinalysis negative, discussed findings STI labs pending, will treat further per protocol, advised abstinence until lab results, treating prophylactically for yeast,  Diflucan prescribed, nystatin prescribed for breast, scant erythema and Sokhna Christoph vaginal discharge present, skin intact, will treat further per protocol, may follow-up with urgent care as needed  Mild swelling and pain present to the left toe, per chart review unable to find record of related ED visit, at the time no signs of infection therefore deferring antibiotic use, prophylactically providing coverage for gout, which patient believes is the cause of symptoms, prescribed colchicine and discussed administration, recommended supportive care  Given resources for local homeless shelters  Final Clinical Impressions(s) / UC Diagnoses   Final diagnoses:  Encounter for medication refill  Vaginal discharge  Major depression, chronic  Pain of toe of left foot     Discharge Instructions      Today you are being treated prophylactically for yeast  Urinalysis negative   Take diflucan 150 mg once, if symptoms still present in 3 days then you may take second pill   May use powder every 8 hours underneath the breast  Yeast infections which are caused by a naturally occurring fungus called candida. Vaginosis is an inflammation of the vagina that can result in discharge, itching and pain. The cause is usually a  change in the normal balance of vaginal bacteria or an infection. Vaginosis can also result from reduced estrogen levels after menopause.  Labs pending 2-3 days, you will be contacted if positive for any sti and treatment will be sent to the pharmacy, you will have to return to the clinic if positive for gonorrhea to receive treatment   Please refrain from having sex until labs results, if positive please refrain from having sex until treatment complete and symptoms resolve   If positive for Chlamydia  gonorrhea or trichomoniasis please notify partner or partners so they may tested as well  Moving forward, it is recommended you use some form of protection against the transmission of sti infections  such as condoms or dental dams with each sexual encounter    In addition:   Avoid baths, hot tubs and whirlpool spas.  Don't use scented or harsh soaps, such as those with deodorant or antibacterial action. Avoid irritants. These include scented tampons and pads. Wipe from front to back after using the toilet.  Don't douche. Your vagina doesn't require cleansing other than normal bathing.  Use a  condom. Wear cotton underwear, this fabric helps absorb moisture    For the foot, take colchine as directed      ED Prescriptions     Medication Sig Dispense Auth. Provider   PARoxetine (PAXIL) 20 MG tablet Take 1 tablet (20 mg total) by mouth daily. 30 tablet Salli Quarry R, NP   traZODone (DESYREL) 50 MG tablet Take 0.5 tablets (25 mg total) by mouth at bedtime. 30 tablet Madelina Sanda R, NP   colchicine 0.6 MG tablet Take 2 tablets ( 1.2 mg ) then in 1 hour take 1 tablet ( 0.6 mg),  then take 1 tablet ( 0.6 mg ) daily for 6 days 9 tablet Davaun Quintela R, NP   nystatin (MYCOSTATIN/NYSTOP) powder Apply 1 Application topically 3 (three) times daily. 15 g Nyasia Baxley R, NP   fluconazole (DIFLUCAN) 150 MG tablet Take 1 tablet (150 mg total) by mouth daily for 2 doses. 2 tablet Valinda Hoar, NP      PDMP not reviewed this encounter.   Valinda Hoar, NP 01/15/23 [phone]

## 2023-01-15 NOTE — Discharge Instructions (Addendum)
Today you are being treated prophylactically for yeast  Urinalysis negative   Take diflucan 150 mg once, if symptoms still present in 3 days then you may take second pill   May use powder every 8 hours underneath the breast  Yeast infections which are caused by a naturally occurring fungus called candida. Vaginosis is an inflammation of the vagina that can result in discharge, itching and pain. The cause is usually a change in the normal balance of vaginal bacteria or an infection. Vaginosis can also result from reduced estrogen levels after menopause.  Labs pending 2-3 days, you will be contacted if positive for any sti and treatment will be sent to the pharmacy, you will have to return to the clinic if positive for gonorrhea to receive treatment   Please refrain from having sex until labs results, if positive please refrain from having sex until treatment complete and symptoms resolve   If positive for Chlamydia  gonorrhea or trichomoniasis please notify partner or partners so they may tested as well  Moving forward, it is recommended you use some form of protection against the transmission of sti infections  such as condoms or dental dams with each sexual encounter    In addition:   Avoid baths, hot tubs and whirlpool spas.  Don't use scented or harsh soaps, such as those with deodorant or antibacterial action. Avoid irritants. These include scented tampons and pads. Wipe from front to back after using the toilet.  Don't douche. Your vagina doesn't require cleansing other than normal bathing.  Use a  condom. Wear cotton underwear, this fabric helps absorb moisture    For the foot, take colchine as directed

## 2023-01-15 NOTE — ED Triage Notes (Signed)
Pt reports she just finished a antibx treatment and now has a vag discharge. Pt also needs refill on 2 meds.

## 2023-01-16 LAB — CERVICOVAGINAL ANCILLARY ONLY
Bacterial Vaginitis (gardnerella): NEGATIVE
Candida Glabrata: NEGATIVE
Candida Vaginitis: NEGATIVE
Chlamydia: NEGATIVE
Comment: NEGATIVE
Comment: NEGATIVE
Comment: NEGATIVE
Comment: NEGATIVE
Comment: NEGATIVE
Comment: NORMAL
Neisseria Gonorrhea: NEGATIVE
Trichomonas: NEGATIVE

## 2023-01-31 ENCOUNTER — Encounter: Payer: Self-pay | Admitting: Podiatry

## 2023-01-31 ENCOUNTER — Ambulatory Visit (INDEPENDENT_AMBULATORY_CARE_PROVIDER_SITE_OTHER): Payer: 59 | Admitting: Podiatry

## 2023-01-31 VITALS — Ht 63.0 in | Wt 145.0 lb

## 2023-01-31 DIAGNOSIS — M79675 Pain in left toe(s): Secondary | ICD-10-CM

## 2023-01-31 DIAGNOSIS — M19079 Primary osteoarthritis, unspecified ankle and foot: Secondary | ICD-10-CM | POA: Diagnosis not present

## 2023-01-31 DIAGNOSIS — M79674 Pain in right toe(s): Secondary | ICD-10-CM

## 2023-01-31 DIAGNOSIS — B351 Tinea unguium: Secondary | ICD-10-CM

## 2023-01-31 DIAGNOSIS — M25571 Pain in right ankle and joints of right foot: Secondary | ICD-10-CM

## 2023-01-31 DIAGNOSIS — M25572 Pain in left ankle and joints of left foot: Secondary | ICD-10-CM

## 2023-01-31 MED ORDER — DICLOFENAC SODIUM 1 % EX GEL: 4.0000 g | Freq: Four times a day (QID) | CUTANEOUS | 4 refills | Status: DC

## 2023-01-31 NOTE — Patient Instructions (Signed)
Ask your health care provider which exercises are safe for you. Do exercises exactly as told by your health care provider and adjust them as directed. It is normal to feel mild stretching, pulling, tightness, or discomfort as you do these exercises. Stop right away if you feel sudden pain or your pain gets worse. Do not begin these exercises until told by your health care provider. Stretching and range-of-motion exercises These exercises warm up your muscles and joints and improve the movement and flexibility in your ankle and foot. These exercises may also help to relieve pain. Standing wall calf stretch, knee straight   Stand with your hands against a wall. Extend your left / right leg behind you, and bend your front knee slightly. If directed, place a folded washcloth under the arch of your foot for support. Point the toes of your back foot slightly inward. Keeping your heels on the floor and your back knee straight, shift your weight toward the wall. Do not allow your back to arch. You should feel a gentle stretch in your upper left / right calf. Hold this position for 10 seconds. Repeat 10 times. Complete this exercise 2 times a day. Standing wall calf stretch, knee bent Stand with your hands against a wall. Extend your left / right leg behind you, and bend your front knee slightly. If directed, place a folded washcloth under the arch of your foot for support. Point the toes of your back foot slightly inward. Unlock your back knee so it is bent. Keep your heels on the floor. You should feel a gentle stretch deep in your lower left / right calf. Hold this position for 10 seconds. Repeat 10 times. Complete this exercise 2 times a day. Strengthening exercises These exercises build strength and endurance in your ankle and foot. Endurance is the ability to use your muscles for a long time, even after they get tired. Ankle inversion with band Secure one end of a rubber exercise band or tubing  to a fixed object, such as a table leg or a pole, that will stay still when the band is pulled. Loop the other end of the band around the middle of your left / right foot. Sit on the floor facing the object with your left / right leg extended. The band or tube should be slightly tense when your foot is relaxed. Leading with your big toe, slowly bring your left / right foot and ankle inward, toward your other foot (inversion). Hold this position for 10 seconds. Slowly return your foot to the starting position. Repeat 10 times. Complete this exercise 2 times a day. Towel curls   Sit in a chair on a non-carpeted surface, and put your feet on the floor. Place a towel in front of your feet. Keeping your heel on the floor, put your left / right foot on the towel. Pull the towel toward you by grabbing the towel with your toes and curling them under. Keep your heel on the floor while you do this. Let your toes relax. Grab the towel with your toes again. Keep going until the towel is completely underneath your foot. Repeat 10 times. Complete this exercise 2 times a day. Balance exercise This exercise improves or maintains your balance. Balance is important in preventing falls. Single leg stand Without wearing shoes, stand near a railing or in a doorway. You may hold on to the railing or door frame as needed for balance. Stand on your left / right foot. Keep  your big toe down on the floor and try to keep your arch lifted. If balancing in this position is too easy, try the exercise with your eyes closed or while standing on a pillow. Hold this position for 10 seconds. Repeat 10 times. Complete this exercise 2 times a day. This information is not intended to replace advice given to you by your health care provider. Make sure you discuss any questions you have with your health care provider.

## 2023-02-01 ENCOUNTER — Ambulatory Visit: Payer: Medicare HMO | Admitting: Podiatry

## 2023-02-04 NOTE — Progress Notes (Signed)
  Subjective:  Patient ID: Angela [name], female    DOB: [birth year],  MRN: [id number]  Chief Complaint  Patient presents with   Nail Problem    RFC    73 y.o. female presents with the above complaint. History confirmed with patient.   Objective:  Physical Exam: warm, good capillary refill, no trophic changes or ulcerative lesions, normal DP and PT pulses, and normal sensory exam. Left Foot: dystrophic yellowed discolored nail plates with subungual debris and pain and tenderness of the sinus tarsi Right Foot: dystrophic yellowed discolored nail plates with subungual debris   Assessment:   1. Sinus tarsi syndrome of both ankles   2. Pain due to onychomycosis of toenails of both feet   3. Osteoarthritis of ankle or foot, unspecified laterality      Plan:  Patient was evaluated and treated and all questions answered.  Discussed the etiology and treatment options for the condition in detail with the patient.   Recommended debridement of the nails today. Sharp and mechanical debridement performed of all painful and mycotic nails today. Nails debrided in length and thickness using a nail nipper to level of comfort. Discussed treatment options including appropriate shoe gear. Follow up as needed for painful nails.  We discussed the pain she is having and both feet.  I discussed with her there is likely osteoarthritis and degenerative changes and sinus tarsi syndrome.  We discussed treatment of this including use of anti-inflammatory she has multiple allergies and cannot tolerate NSAIDs directly, I discussed with her the option of a corticosteroid injection which she declined due to the tenderness she has a day so I recommend utilizing topical Voltaren diclofenac gel and Rx for this was sent to her pharmacy and she will trial this.  She will follow-up me as needed at her request we discussed physical therapy acupuncture massage therapy and shoe gear and she requested a prescription for new  balance shoes and these prescriptions were provided and referral sent to [facility] for this.  Return if symptoms worsen or fail to improve, for  .

## 2023-02-09 ENCOUNTER — Emergency Department
Admission: EM | Admit: 2023-02-09 | Discharge: 2023-02-09 | Disposition: A | Discharge status: Home / Self Care | Payer: 59 | Attending: Emergency Medicine | Admitting: Emergency Medicine

## 2023-02-09 NOTE — ED Triage Notes (Signed)
Pt arrives via [place] with CC of allergic reaction. Pt ambulatory to room 15 not making sense - rambling. Visualized pts back of throat - no signs of swelling or redness. Pt began screaming at staff when asking triage questions and requesting dr. Felton Clinton aggressive, stood up and said she was leaving if she did not see a dr right now. Attempted to explain triage process but pt continued to yell at staff. Grabbed rollater and exited room with steady gait.

## 2023-02-28 ENCOUNTER — Telehealth (HOSPITAL_COMMUNITY): Payer: Self-pay | Admitting: *Deleted

## 2023-02-28 NOTE — Telephone Encounter (Signed)
Doctor says pt does not need OT Services For Dr. Corie Chiquito   Called contact back and advised that Eunice Blase only works at [employer] [facility] and she is staffed at [facility] normally.   Caller was given contact number for [facility] she states she will call that office now.

## 2023-03-06 ENCOUNTER — Ambulatory Visit
Admission: RE | Admit: 2023-03-06 | Discharge: 2023-03-06 | Disposition: A | Discharge status: Home / Self Care | Payer: 59 | Source: Ambulatory Visit

## 2023-03-06 ENCOUNTER — Telehealth: Payer: Self-pay

## 2023-03-06 VITALS — BP 134/89 | HR 79 | Temp 98.0°F | Resp 18

## 2023-03-06 DIAGNOSIS — J441 Chronic obstructive pulmonary disease with (acute) exacerbation: Secondary | ICD-10-CM

## 2023-03-06 DIAGNOSIS — R059 Cough, unspecified: Secondary | ICD-10-CM

## 2023-03-06 DIAGNOSIS — R0602 Shortness of breath: Secondary | ICD-10-CM

## 2023-03-06 MED ORDER — DEXAMETHASONE 1 MG PO TABS: 1.0000 mg | ORAL_TABLET | Freq: Every day | ORAL | 0 refills | Status: DC

## 2023-03-06 MED ORDER — ALBUTEROL SULFATE (2.5 MG/3ML) 0.083% IN NEBU
2.5000 mg | INHALATION_SOLUTION | Freq: Once | RESPIRATORY_TRACT | Status: AC
  Administered 2023-03-06: 2.5 mg via RESPIRATORY_TRACT

## 2023-03-06 MED ORDER — DEXAMETHASONE 1 MG PO TABS
1.0000 mg | ORAL_TABLET | Freq: Every day | ORAL | 0 refills | Status: AC
Start: 2023-03-06 — End: 2023-03-11

## 2023-03-06 MED ORDER — DEXAMETHASONE SODIUM PHOSPHATE 10 MG/ML IJ SOLN: 10.0000 mg | Freq: Once | INTRAMUSCULAR | Status: DC

## 2023-03-06 NOTE — Discharge Instructions (Addendum)
You were given an albuterol nebulizer treatment here today.  Take the Medrol Dosepak as directed.  Use your inhaler and nebulizer treatments as directed.  Follow-up with your primary care provider or pulmonologist tomorrow.  Go to the emergency department if you have worsening symptoms.

## 2023-03-06 NOTE — ED Triage Notes (Signed)
Patient to Urgent Care with complaints of nasal congestion/ fatigue  Also presents with joint pain.  Reports symptoms started one month ago.

## 2023-03-06 NOTE — ED Provider Notes (Addendum)
Angela [name]    CSN: [id number] Arrival date & time: 03/06/23  1638      History   Chief Complaint Chief Complaint  Patient presents with   Multiple Complaints    HPI Angela [name] is a 73 y.o. female.  Patient presents with 1 month history of congestion, cough, fatigue, arthralgias.  She reports intermittent shortness of breath with coughing.  No fever.    Patient was seen by her pulmonologist on 02/13/2023; diagnosed with shortness of breath, COPD with acute lower respiratory infection; treated with dexamethasone and doxycycline.  She was seen by her pulmonologist on 12/27/2022 for COPD exacerbation; treated with Spiriva, Xopenex nebulizer, EpiPen, albuterol inhaler.  The history is provided by the patient and medical records.    Past Medical History:  Diagnosis Date   Agitation 09/18/2017   Allergic rhinitis    Altered mental status, unspecified 12/25/2018   Anemia    Blurred vision    Chest pain of uncertain etiology 05/31/2018   Confusion state 12/25/2018   COPD (chronic obstructive pulmonary disease) (HCC)    Depression    Diverticulosis    Endometriosis    Falls    GERD (gastroesophageal reflux disease)    Hernia, hiatal    Hypothyroidism    IBS (irritable bowel syndrome)    Labile mood    Malignant neoplasm of skin    Malnutrition of mild degree (HCC) 08/08/2018   Migraine    Neuropathy    Noncompliance 12/25/2017   PTSD (post-traumatic stress disorder)    SIADH (syndrome of inappropriate ADH production) (HCC) 09/08/2016   Thyroid disease     Patient Active Problem List   Diagnosis Date Noted   Stress reaction causing mixed disturbance of emotion and conduct 09/10/2021   Homeless    Mixed hyperlipidemia 07/02/2019   Hypothyroidism due to acquired atrophy of thyroid 04/29/2019   Osteopenia determined by x-ray 04/29/2019   Acquired claw toe of both feet 10/23/2018   Aortic atherosclerosis (HCC) 05/31/2018   Achilles tendon disorder 04/25/2018    Diastolic dysfunction 03/29/2018   Mitral regurgitation 03/29/2018   Personality disorder (HCC) 12/25/2017   Arthritis of hand, degenerative 12/07/2017   Hallux valgus, acquired 07/05/2017   Hammer toes of both feet 07/05/2017   Unstable ankle 07/05/2017   Tinea unguium 05/08/2017   Conductive hearing loss of right ear with unrestricted hearing of left ear 04/03/2017   Adjustment disorder with depressed mood 01/18/2017   Migraine 01/18/2017   Irritable bowel syndrome with constipation 11/17/2016   Bilateral carpal tunnel syndrome 11/17/2016   Plantar fasciitis, bilateral 11/17/2016   Cervical myofascial pain syndrome 11/17/2016   Chronic hyponatremia 11/17/2016   PTSD (post-traumatic stress disorder) 11/17/2016   Hiatal hernia 11/17/2016   Vitamin D deficiency 11/17/2016   Vitamin B12 deficiency 11/17/2016   Cervical spondylosis with myelopathy 05/24/2016   MCI (mild cognitive impairment) with memory loss 03/30/2016   Gait abnormality 03/30/2016   Chronic bipolar affective disorder (HCC) 01/24/2016   Anemia, iron deficiency 06/24/2015   Benign essential HTN 06/24/2015   History of falling 05/22/2015   History of TIA (transient ischemic attack) 04/20/2015   Gastroesophageal reflux disease with hiatal hernia 04/20/2015   Cervical disc disorder at C5-C6 level with radiculopathy 03/25/2015   Chronic neck pain 03/25/2015   Pelvic pain in female 10/30/2014   History of skin cancer 06/30/2014   Chronic headaches 12/16/2013   Neuropathy 12/16/2013    Past Surgical History:  Procedure Laterality Date   APPENDECTOMY  CHOLECYSTECTOMY     CHOLECYSTECTOMY, LAPAROSCOPIC     ESOPHAGOGASTRODUODENOSCOPY (EGD) WITH PROPOFOL N/A 03/29/2015   Procedure: ESOPHAGOGASTRODUODENOSCOPY (EGD) WITH PROPOFOL;  Surgeon: Elnita Maxwell, MD;  Location: [facility] END[facility]COPY;  Service: Endoscopy;  Laterality: N/A;   HERNIA REPAIR     laproscopy     TONSILLECTOMY     uteral suspension      OB  History     Gravida  1   Para      Term      Preterm      AB  1   Living  0      SAB      IAB  1   Ectopic      Multiple      Live Births               Home Medications    Prior to Admission medications   Medication Sig Start Date End Date Taking? Authorizing Provider  allopurinol (ZYLOPRIM) 100 MG tablet Take 100 mg by mouth daily. 02/08/23  Yes [provider]  dexamethasone (DECADRON) 1 MG tablet Take 1 tablet (1 mg total) by mouth daily with breakfast for 5 days. 03/06/23 03/11/23 Yes Mickie Bail, NP  EPINEPHrine 0.3 mg/0.3 mL IJ SOAJ injection Inject into the skin. 03/01/22  Yes [provider]  Tiotropium Bromide Monohydrate 2.5 MCG/ACT AERS Inhale into the lungs. 04/22/22 02/19/24 Yes [provider]  acetaminophen (TYLENOL) 650 MG CR tablet Take 650 mg by mouth every 8 (eight) hours as needed for pain.    [provider]  Acetylcysteine (NAC) 500 MG CAPS Take 500 mg by mouth 2 (two) times a day.    [provider]  albuterol (VENTOLIN HFA) 108 (90 Base) MCG/ACT inhaler albuterol sulfate HFA 90 mcg/actuation aerosol inhaler  INHALE 2 PUFFS BY MOUTH EVERY 4 HOURS AS NEEDED    [provider]  ascorbic acid (VITAMIN C) 1000 MG tablet Vitamin C 1,000 mg tablet  Take 1 tablet twice a day by oral route. Patient not taking: Reported on 03/06/2023    [provider]  calcium carbonate (OSCAL) 1500 (600 Ca) MG TABS tablet Calcium 600 mg calcium (1,500 mg) tablet  Take 1 tablet 3 times a day by oral route.    [provider]  Calcium Citrate (CITRACAL PO) Take 800 mg by mouth. Takes 2 (400mg ) tablets BID Patient not taking: Reported on 03/06/2023    [provider]  Cholecalciferol 100 MCG (4000 UT) CAPS Take 3 capsules by mouth. Patient not taking: Reported on 03/06/2023    [provider]  colchicine 0.6 MG tablet Take 2 tablets ( 1.2 mg ) then in 1 hour take 1 tablet ( 0.6 mg),  then  take 1 tablet ( 0.6 mg ) daily for 6 days Patient not taking: Reported on 03/06/2023 01/15/23   Valinda Hoar, NP  Cyanocobalamin (VITAMIN B-12) 1000 MCG SUBL Place 1,000 mcg under the tongue.  Patient not taking: Reported on 03/06/2023    [provider]  diclofenac Sodium (VOLTAREN) 1 % GEL Apply 4 g topically 4 (four) times daily. Patient not taking: Reported on 03/06/2023 01/31/23   Edwin Cap, DPM  famotidine (PEPCID) 20 MG tablet Take 1 tablet (20 mg total) by mouth 2 (two) times daily as needed for heartburn or indigestion. 11/26/18   Sherlene Shams, MD  famotidine (PEPCID) 40 MG tablet TAKE 1 TABLET AT BEDTIME ORALLY ONCE A DAY 90 DAYS  [provider]  ferrous sulfate 325 (65 FE) MG tablet Take 1 tablet (325 mg total) by mouth daily with breakfast. 04/29/19   Reubin Milan, MD  hydrALAZINE (APRESOLINE) 25 MG tablet Take 1 tablet (25 mg total) by mouth 3 (three) times daily as needed (for pressure >150). 04/19/20   Antonieta Iba, MD  HYDROcodone-acetaminophen (NORCO/VICODIN) 5-325 MG tablet Take 1 tablet by mouth every 6 (six) hours as needed. Patient not taking: Reported on 03/06/2023 04/19/20   [provider]  hydrOXYzine (ATARAX/VISTARIL) 10 MG tablet TAKE 1 TABLET BY MOUTH EVERY 8 HOURS AS NEEDED Patient not taking: Reported on 03/06/2023 06/04/19   Reubin Milan, MD  Icosapent Ethyl (VASCEPA) 1 g CAPS TAKE 2 CAPSULES BY MOUTH TWICE DAILY (TO HELP WITH TRIGLYCERIDES) 11/26/18   Sherlene Shams, MD  ivermectin (STROMECTOL) 3 MG TABS tablet TAKE FIVE TABLETS BY MOUTH ON AN EMPTY STOMACH. REPEAT IN ONE WEEK. WASH BEDDING, TOWELS, WORN CLOTHES IN HOT WATER AFTER EACH DOSE Patient not taking: Reported on 03/06/2023 07/08/19   Willeen Niece, MD  ketoconazole (NIZORAL) 2 % cream  02/04/19   [provider]  Lactulose 20 GM/30ML SOLN 30 ml every 4 hours until constipation is relieved Patient not taking: Reported on 03/06/2023 04/29/19   Reubin Milan, MD  levalbuterol Pauline Aus) 0.63 MG/3ML nebulizer solution Take 3 mLs (0.63 mg total) by nebulization every 4 (four) hours as needed for wheezing or shortness of breath. 04/29/19   Reubin Milan, MD  levothyroxine (SYNTHROID) 100 MCG tablet Take 100 mcg by mouth daily before breakfast. Patient not taking: Reported on 03/06/2023    [provider]  levothyroxine (SYNTHROID) 112 MCG tablet Take 112 mcg by mouth daily before breakfast.    [provider]  Lidocaine 4 % PTCH Aspercreme (lidocaine) 4 % topical patch Patient not taking: Reported on 03/06/2023    [provider]  linaclotide (LINZESS) 290 MCG CAPS capsule Take 1 capsule (290 mcg total) by mouth daily before breakfast. Patient not taking: Reported on 03/06/2023 12/26/17   Clapacs, Jackquline Denmark, MD  LORazepam (ATIVAN) 0.5 MG tablet TAKE 1 TABLET BY MOUTH AS NEEDED ONCE DAILY Patient not taking: Reported on 03/06/2023 02/06/18   [provider]  Magnesium 500 MG TABS Take 500 mg by mouth every other day.    [provider]  miconazole (MICOTIN) 200 MG vaginal suppository Place 1 suppository (200 mg total) vaginally at bedtime. Patient not taking: Reported on 03/06/2023 01/15/23   Valinda Hoar, NP  mupirocin ointment (BACTROBAN) 2 % Apply 1 application topically 3 (three) times daily. Patient not taking: Reported on 03/06/2023 02/04/19   [provider]  neomycin-bacitracin-polymyxin (NEOSPORIN) ointment Apply topically. Patient not taking: Reported on 03/06/2023 03/11/20   [provider]  nystatin (MYCOSTATIN/NYSTOP) powder Apply 1 Application topically 3 (three) times daily. 01/15/23   Valinda Hoar, NP  ofloxacin (FLOXIN) 0.3 % OTIC solution ofloxacin 0.3 % ear drops Patient not taking: Reported on 03/06/2023    [provider]  ondansetron (ZOFRAN) 4 MG tablet ondansetron HCl 4 mg tablet    [provider]  pantoprazole (PROTONIX) 40 MG tablet Take 1  tablet by mouth daily.    [provider]  PARoxetine (PAXIL) 20 MG tablet Take 1 tablet (20 mg total) by mouth daily. 01/15/23   White, Elita Boone, NP  polyethylene glycol-electrolytes (NULYTELY) 420 g solution     [provider]  prednisoLONE acetate (PRED FORTE) 1 %  ophthalmic suspension INSTILL 1 DROP INTO EACH EYE TWICE DAILY Patient not taking: Reported on 03/06/2023 07/26/18   [provider]  PREMARIN vaginal cream INSERT ONE APPLICATORFUL VAGINALLY TWICE A WEEK. Patient not taking: Reported on 03/06/2023 05/04/21   Hildred Laser, MD  promethazine (PHENERGAN) 12.5 MG tablet Take 12.5 mg by mouth every 6 (six) hours as needed for nausea or vomiting. Patient not taking: Reported on 03/06/2023    [provider]  pyridoxine (B-6) 100 MG tablet Take 1 tablet (100 mg total) by mouth 2 (two) times daily. Patient not taking: Reported on 03/06/2023 12/26/17   Clapacs, Jackquline Denmark, MD  REFRESH 1.4-0.6 % SOLN Apply to eye. Patient not taking: Reported on 03/06/2023 03/17/20   [provider]  Senna-Natural Laxatives (SENOKOT LAXATIVE) Tea Bag MISC Drink 1 cup daily as needed for constipation. Adding 30ml of Lactulose to tea.    [provider]  terconazole (TERAZOL 7) 0.4 % vaginal cream terconazole 0.4 % vaginal cream Patient not taking: Reported on 03/06/2023    [provider]  tiZANidine (ZANAFLEX) 2 MG tablet Take by mouth every 6 (six) hours as needed for muscle spasms. Patient not taking: Reported on 03/06/2023    [provider]  traZODone (DESYREL) 50 MG tablet Take 0.5 tablets (25 mg total) by mouth at bedtime. 01/15/23   White, Elita Boone, NP  triamcinolone ointment (KENALOG) 0.5 % Apply 1 application topically 2 (two) times daily. Patient not taking: Reported on 03/06/2023 11/26/18   Sherlene Shams, MD  umeclidinium bromide (INCRUSE ELLIPTA) 62.5 MCG/INH AEPB Inhale 1 puff into the lungs daily. Patient not taking: Reported on  03/06/2023 11/26/18   Sherlene Shams, MD  valsartan (DIOVAN) 320 MG tablet Take 320 mg by mouth daily.    [provider]  Vitamin D, Ergocalciferol, (DRISDOL) 1.25 MG (50000 UNIT) CAPS capsule Take 1 capsule (50,000 Units total) by mouth every 7 (seven) days. Patient not taking: Reported on 03/06/2023 05/19/19   Reubin Milan, MD  Benay Spice 5 % SOLN INSTILL 1 DROP INTO [facility] EYE TWICE DAILY Patient not taking: Reported on 03/06/2023 07/27/18   [provider]    Family History Family History  Problem Relation Age of Onset   Heart disease Father    Hearing loss Father    COPD Father    Depression Father    Stroke Father    Vision loss Father    Varicose Veins Father    Cancer Mother        lung   Depression Sister    Arthritis Sister    Arthritis Brother    Hernia Brother    Anxiety disorder Brother    Arthritis Brother    Hernia Brother    Anxiety disorder Brother    Urolithiasis Neg Hx    Kidney disease Neg Hx    Kidney cancer Neg Hx    Prostate cancer Neg Hx    Breast cancer Neg Hx     Social History Social History   Tobacco Use   Smoking status: Former    Current packs/day: 0.00    Types: Cigarettes    Quit date: 11/17/1976    Years since quitting: 46.3   Smokeless tobacco: Never  Vaping Use   Vaping status: Never Used  Substance Use Topics   Alcohol use: Yes    Comment: occ   Drug use: No     Allergies   Amoxicillin-pot clavulanate, Aspirin, Bee venom, Cephalexin, Dicyclomine, Fluticasone-salmeterol, Haloperidol, Molds &  smuts, Other, Oxycodone-acetaminophen, Peanut butter flavoring agent (non-screening), Peanuts [peanut oil], Penicillin g, Penicillins, Propoxyphene, Sulfa antibiotics, Sulfacetamide sodium, Tramadol, Amikacin, Bee pollen, Imipramine, Prednisone, Sulfur, Azithromycin, Doxycycline, Elemental sulfur, Fluticasone, Honey bee venom, Hymenoptera venom preparations, Lactose, Lactose intolerance (gi), Peanut-containing drug products,  Phthalylsulfacetamide, Pregabalin, Risperidone, Sulfacetamide, Wound dressing adhesive, Adhesive [tape], Albuterol, Ciprofloxacin, and Hydroxyzine   Review of Systems Review of Systems  Constitutional:  Negative for chills and fever.  HENT:  Positive for congestion. Negative for ear pain and sore throat.   Respiratory:  Positive for cough and shortness of breath.      Physical Exam Triage Vital Signs ED Triage Vitals [03/06/23 1714]  Encounter Vitals Group     BP 134/89     Systolic BP Percentile      Diastolic BP Percentile      Pulse Rate 79     Resp 18     Temp 98 F (36.7 C)     Temp src      SpO2 95 %     Weight      Height      Head Circumference      Peak Flow      Pain Score      Pain Loc      Pain Education      Exclude from Growth Chart    No data found.  Updated Vital Signs BP 134/89   Pulse 79   Temp 98 F (36.7 C)   Resp 18   SpO2 95%   Visual Acuity Right Eye Distance:   Left Eye Distance:   Bilateral Distance:    Right Eye Near:   Left Eye Near:    Bilateral Near:     Physical Exam Constitutional:      General: She is not in acute distress. HENT:     Right Ear: Tympanic membrane normal.     Left Ear: Tympanic membrane normal.     Nose: Nose normal.     Mouth/Throat:     Mouth: Mucous membranes are moist.     Pharynx: Oropharynx is clear.  Cardiovascular:     Rate and Rhythm: Normal rate and regular rhythm.     Heart sounds: Normal heart sounds.  Pulmonary:     Effort: Pulmonary effort is normal. No respiratory distress.     Breath sounds: Normal breath sounds.  Neurological:     Mental Status: She is alert.      UC Treatments / Results  Labs (all labs ordered are listed, but only abnormal results are displayed) Labs Reviewed - No data to display  EKG   Radiology No results found.  Procedures Procedures (including critical care time)  Medications Ordered in UC Medications  albuterol (PROVENTIL) (2.5 MG/3ML) 0.083%  nebulizer solution 2.5 mg (2.5 mg Nebulization Given 03/06/23 1742)    Initial Impression / Assessment and Plan / UC Course  I have reviewed the triage vital signs and the nursing notes.  Pertinent labs & imaging results that were available during my care of the patient were reviewed by me and considered in my medical decision making (see chart for details).    Shortness of breath, cough, COPD exacerbation.  No respiratory distress, lungs are clear at this time.  O2 sat 95% on room air.  Per patient request, albuterol nebulizer treatment given here.  Patient's chart says that she experiences palpitations with albuterol but she denies this.  Treating with dexamethasone 1 mg daily x 5 days.  Instructed patient  to continue using her inhaler and nebulizer treatments at home.  Instructed patient to follow-up with her PCP or pulmonologist tomorrow.  Education provided on shortness of breath and COPD exacerbation.  ED precautions given.  Patient agrees to plan of care.  Final Clinical Impressions(s) / UC Diagnoses   Final diagnoses:  Shortness of breath  Cough, unspecified type  COPD exacerbation (HCC)     Discharge Instructions      You were given an albuterol nebulizer treatment here today.  Take the Medrol Dosepak as directed.  Use your inhaler and nebulizer treatments as directed.  Follow-up with your primary care provider or pulmonologist tomorrow.  Go to the emergency department if you have worsening symptoms.     ED Prescriptions     Medication Sig Dispense Auth. Provider   dexamethasone (DECADRON) 1 MG tablet Take 1 tablet (1 mg total) by mouth daily with breakfast for 5 days. 5 tablet Mickie Bail, NP      PDMP not reviewed this encounter.   Mickie Bail, NP 03/06/23 1805    Mickie Bail, NP 03/06/23 [phone]

## 2023-04-24 ENCOUNTER — Other Ambulatory Visit: Payer: Self-pay

## 2023-04-24 ENCOUNTER — Encounter: Payer: Self-pay | Admitting: Emergency Medicine

## 2023-04-24 DIAGNOSIS — R1013 Epigastric pain: Secondary | ICD-10-CM | POA: Insufficient documentation

## 2023-04-24 DIAGNOSIS — E039 Hypothyroidism, unspecified: Secondary | ICD-10-CM | POA: Diagnosis not present

## 2023-04-24 DIAGNOSIS — Z5900 Homelessness unspecified: Secondary | ICD-10-CM | POA: Diagnosis not present

## 2023-04-24 DIAGNOSIS — M79671 Pain in right foot: Secondary | ICD-10-CM | POA: Insufficient documentation

## 2023-04-24 DIAGNOSIS — M79672 Pain in left foot: Secondary | ICD-10-CM | POA: Diagnosis not present

## 2023-04-24 DIAGNOSIS — R112 Nausea with vomiting, unspecified: Secondary | ICD-10-CM | POA: Insufficient documentation

## 2023-04-24 LAB — COMPREHENSIVE METABOLIC PANEL WITH GFR
ALT: 19 U/L (ref 0–44)
AST: 30 U/L (ref 15–41)
Albumin: 4.2 g/dL (ref 3.5–5.0)
Alkaline Phosphatase: 85 U/L (ref 38–126)
Anion gap: 11 (ref 5–15)
BUN: 14 mg/dL (ref 8–23)
CO2: 26 mmol/L (ref 22–32)
Calcium: 8.9 mg/dL (ref 8.9–10.3)
Chloride: 95 mmol/L — ABNORMAL LOW (ref 98–111)
Creatinine, Ser: 0.8 mg/dL (ref 0.44–1.00)
GFR, Estimated: >60 mL/min (ref >60)
Glucose, Bld: 90 mg/dL (ref 70–99)
Potassium: 3.5 mmol/L (ref 3.5–5.1)
Sodium: 132 mmol/L — ABNORMAL LOW (ref 135–145)
Total Bilirubin: 0.6 mg/dL (ref 0.0–1.2)
Total Protein: 7.3 g/dL (ref 6.5–8.1)

## 2023-04-24 LAB — CBC
HCT: 37.8 % (ref 36.0–46.0)
Hemoglobin: 12.8 g/dL (ref 12.0–15.0)
MCH: 30.6 pg (ref 26.0–34.0)
MCHC: 33.9 g/dL (ref 30.0–36.0)
MCV: 90.4 fL (ref 80.0–100.0)
Platelets: 305 10*3/uL (ref 150–400)
RBC: 4.18 MIL/uL (ref 3.87–5.11)
RDW: 14 % (ref 11.5–15.5)
WBC: 6.4 10*3/uL (ref 4.0–10.5)
nRBC: 0 % (ref 0.0–0.2)

## 2023-04-24 LAB — URIC ACID: Uric Acid, Serum: 4 mg/dL (ref 2.5–7.1)

## 2023-04-24 LAB — LIPASE, BLOOD: Lipase: 44 U/L (ref 11–51)

## 2023-04-24 NOTE — ED Notes (Signed)
 Pt ambulatory to STAT desk to inquire over wait time; pt updated on such and expresses concerns over possible gout flare-up and that she has been off of there thyroid meds for several mos

## 2023-04-24 NOTE — ED Triage Notes (Addendum)
 Patient to ED via POV for N/V- states ongoing for months. States she also has gout in her feet causing them to swelling. Ambulatory with walker to triage. Also, states she has not taken her medication in months.  PT reports she is homeless and that she is living with a friend and their relationship is strained.

## 2023-04-25 ENCOUNTER — Emergency Department
Admission: EM | Admit: 2023-04-25 | Discharge: 2023-04-25 | Disposition: A | Discharge status: Home / Self Care | Attending: Emergency Medicine | Admitting: Emergency Medicine

## 2023-04-25 DIAGNOSIS — R112 Nausea with vomiting, unspecified: Secondary | ICD-10-CM

## 2023-04-25 DIAGNOSIS — M79671 Pain in right foot: Secondary | ICD-10-CM

## 2023-04-25 DIAGNOSIS — E039 Hypothyroidism, unspecified: Secondary | ICD-10-CM

## 2023-04-25 DIAGNOSIS — R1013 Epigastric pain: Secondary | ICD-10-CM

## 2023-04-25 LAB — URINALYSIS, ROUTINE W REFLEX MICROSCOPIC
Bilirubin Urine: NEGATIVE
Glucose, UA: NEGATIVE mg/dL
Hgb urine dipstick: NEGATIVE
Ketones, ur: NEGATIVE mg/dL
Leukocytes,Ua: NEGATIVE
Nitrite: NEGATIVE
Protein, ur: NEGATIVE mg/dL
Specific Gravity, Urine: 1.003 — ABNORMAL LOW (ref 1.005–1.030)
pH: 8 (ref 5.0–8.0)

## 2023-04-25 LAB — T4, FREE: Free T4: 0.42 ng/dL — ABNORMAL LOW (ref 0.61–1.12)

## 2023-04-25 LAB — TSH: TSH: 78.975 u[IU]/mL — ABNORMAL HIGH (ref 0.350–4.500)

## 2023-04-25 MED ORDER — LEVOTHYROXINE SODIUM 112 MCG PO TABS: 112.0000 ug | ORAL_TABLET | Freq: Every day | ORAL | 2 refills | Status: DC

## 2023-04-25 MED ORDER — SUCRALFATE 1 GM/10ML PO SUSP: 1.0000 g | Freq: Four times a day (QID) | ORAL | 1 refills | Status: DC

## 2023-04-25 MED ORDER — ALLOPURINOL 100 MG PO TABS: 100.0000 mg | ORAL_TABLET | Freq: Every day | ORAL | 1 refills | Status: DC

## 2023-04-25 MED ORDER — TRAZODONE HCL 50 MG PO TABS: 25.0000 mg | ORAL_TABLET | Freq: Every day | ORAL | 1 refills | Status: DC

## 2023-04-25 MED ORDER — LIDOCAINE VISCOUS HCL 2 % MT SOLN
15.0000 mL | Freq: Once | OROMUCOSAL | Status: AC
  Administered 2023-04-25: 15 mL via ORAL
  Filled 2023-04-25: qty 15

## 2023-04-25 MED ORDER — ONDANSETRON 4 MG PO TBDP
4.0000 mg | ORAL_TABLET | Freq: Once | ORAL | Status: AC
  Administered 2023-04-25: 4 mg via ORAL
  Filled 2023-04-25: qty 1

## 2023-04-25 MED ORDER — PANTOPRAZOLE SODIUM 40 MG PO TBEC: 40.0000 mg | DELAYED_RELEASE_TABLET | Freq: Every day | ORAL | 1 refills | Status: DC

## 2023-04-25 MED ORDER — ALUM & MAG HYDROXIDE-SIMETH 200-200-20 MG/5ML PO SUSP
30.0000 mL | Freq: Once | ORAL | Status: AC
  Administered 2023-04-25: 30 mL via ORAL
  Filled 2023-04-25: qty 30

## 2023-04-25 MED ORDER — PAROXETINE HCL 20 MG PO TABS: 20.0000 mg | ORAL_TABLET | Freq: Every day | ORAL | 1 refills | Status: DC

## 2023-04-25 MED ORDER — ONDANSETRON 4 MG PO TBDP
4.0000 mg | ORAL_TABLET | Freq: Three times a day (TID) | ORAL | 0 refills | Status: AC | PRN
Start: 2023-04-25 — End: ?

## 2023-04-25 MED ORDER — HYDRALAZINE HCL 25 MG PO TABS: 25.0000 mg | ORAL_TABLET | Freq: Three times a day (TID) | ORAL | 1 refills | Status: DC | PRN

## 2023-04-25 MED ORDER — DEXAMETHASONE SODIUM PHOSPHATE 10 MG/ML IJ SOLN
10.0000 mg | Freq: Once | INTRAMUSCULAR | Status: AC
  Administered 2023-04-25: 10 mg via INTRAMUSCULAR
  Filled 2023-04-25: qty 1

## 2023-04-25 NOTE — Progress Notes (Signed)
 Chaplain responds to page from ED of pt requesting visit from "a [place] chaplain who doesn't believe in LGBT." Chaplain engages pt in pastoral conversation and she performs life review, sharing her [employer] pl[employer] upbringing in [place] and [place] and the ways in which she's been isolated and ostracized throughout her life.Her faith is deeply important to her and supports her, particularly when she feels so alone and persecuted. Chaplain provides active listening, understanding, and prayer. Pt expresses feeling better after being heard.

## 2023-04-25 NOTE — ED Provider Notes (Signed)
 [facility] Provider Note    Event Date/Time   First MD Initiated Contact with Patient 04/25/23 0121     (approximate)   History   Emesis   HPI  Angela [name] is a 73 y.o. female   Past medical history of hypothyroid who presents to the emerged apartment with bilateral foot pain that she thinks is her gout flare.  She has also been nauseated over the last several weeks and especially worsened after p.o. intake.  She has had her gallbladder removed in the past.  She does not drink alcohol.  She is homeless.  She states she is allergic to NSAIDs.  She states she is allergic to prednisone.  She states that dexamethasone typically helps with her gout.  She denies any abdominal pain.  She has been nauseated.  She has had no GI bleeding.  She has no urinary symptoms.  She wants a refill on all her prescriptions because she has had none of them for the last several weeks.   External Medical Documents Reviewed: Upper endoscopy from 2017 showed a hiatal hernia      Physical Exam   Triage Vital Signs: ED Triage Vitals  Encounter Vitals Group     BP 04/24/23 1816 (!) 157/91     Systolic BP Percentile --      Diastolic BP Percentile --      Pulse Rate 04/24/23 1816 66     Resp 04/24/23 1816 18     Temp 04/24/23 1816 98.4 F (36.9 C)     Temp Source 04/24/23 1816 Oral     SpO2 04/24/23 1816 96 %     Weight 04/24/23 1815 144 lb 2.9 oz (65.4 kg)     Height 04/24/23 1815 5\' 3"  (1.6 m)     Head Circumference --      Peak Flow --      Pain Score 04/24/23 1816 8     Pain Loc --      Pain Education --      Exclude from Growth Chart --     Most recent vital signs: Vitals:   04/25/23 0200 04/25/23 0230  BP: (!) 170/82 (!) 186/92  Pulse: (!) 55 90  Resp:    Temp:    SpO2: 100% 96%    General: Awake, no distress.  CV:  Good peripheral perfusion.  Resp:  Normal effort.  Abd:  No distention.  Other:  Awake alert comfortable, hypertensive  otherwise vital signs normal.  Bilateral foot exam shows no infectious changes, range of motion intact at the ankle and toes, there is no bony tenderness or deformity and neurovascular intact.  Abdomen is soft and nontender deep palpation all quadrants.   ED Results / Procedures / Treatments   Labs (all labs ordered are listed, but only abnormal results are displayed) Labs Reviewed  COMPREHENSIVE METABOLIC PANEL WITH GFR - Abnormal; Notable for the following components:      Result Value   Sodium 132 (*)    Chloride 95 (*)    All other components within normal limits  URINALYSIS, ROUTINE W REFLEX MICROSCOPIC - Abnormal; Notable for the following components:   Color, Urine STRAW (*)    APPearance CLEAR (*)    Specific Gravity, Urine 1.003 (*)    All other components within normal limits  TSH - Abnormal; Notable for the following components:   TSH 78.975 (*)    All other components within normal limits  T4, FREE -  Abnormal; Notable for the following components:   Free T4 0.42 (*)    All other components within normal limits  LIPASE, BLOOD  CBC  URIC ACID     I ordered and reviewed the above labs they are notable for cell counts electrolytes unremarkable.  Slight low T4.    PROCEDURES:  Critical Care performed: No  Procedures   MEDICATIONS ORDERED IN ED: Medications  ondansetron (ZOFRAN-ODT) disintegrating tablet 4 mg (4 mg Oral Given 04/25/23 0238)  alum & mag hydroxide-simeth (MAALOX/MYLANTA) 200-200-20 MG/5ML suspension 30 mL (30 mLs Oral Given 04/25/23 0418)    And  lidocaine (XYLOCAINE) 2 % viscous mouth solution 15 mL (15 mLs Oral Given 04/25/23 0418)  dexamethasone (DECADRON) injection 10 mg (10 mg Intramuscular Given 04/25/23 0418)    IMPRESSION / MDM / ASSESSMENT AND PLAN / ED COURSE  I reviewed the triage vital signs and the nursing notes.                                Patient's presentation is most consistent with acute presentation with potential threat to  life or bodily function.  Differential diagnosis includes, but is not limited to, electrolyte disturbance, dehydration, intra-abdominal infection less likely, thyroid abnormalities, gout or fracture or infected joint   The patient is on the cardiac monitor to evaluate for evidence of arrhythmia and/or significant heart rate changes.  MDM:    In terms of foot pain there is no concerning physical exam finding as above, she is convinced that this is a gout flare and is asking for dexamethasone for which I can give her a one-time dose here.  I do not think septic joint or fracture given benign exam.  She is well-perfused I doubt ischemia.  Not consistent with DVT.  In terms of her nausea it is longstanding and there is a benign abdominal exam so I doubt this is a surgical abdominal pathology at this time.  She has no chest pain to suggest ACS.  She appears euvolemic and comfortable.  Her labs are unremarkable.  She has no follow-up and thus I will put her in touch with a PMD.  She is also asking for a referral for GI so I put that in as well for endoscopy as requested by patient.  Otherwise she looks well enough that I doubt she has a life-threatening emergency at this time, plan for discharge today and knows to follow-up as above, can return with any new or worsening symptoms.       FINAL CLINICAL IMPRESSION(S) / ED DIAGNOSES   Final diagnoses:  Nausea and vomiting, unspecified vomiting type  Bilateral foot pain  Hypothyroidism, unspecified type  Epigastric pain     Rx / DC Orders   ED Discharge Orders          Ordered    ondansetron (ZOFRAN-ODT) 4 MG disintegrating tablet  Every 8 hours PRN        04/25/23 0402    pantoprazole (PROTONIX) 40 MG tablet  Daily        04/25/23 0402    sucralfate (CARAFATE) 1 GM/10ML suspension  4 times daily        04/25/23 0402    allopurinol (ZYLOPRIM) 100 MG tablet  Daily        04/25/23 0402    hydrALAZINE (APRESOLINE) 25 MG tablet  3 times  daily PRN        04/25/23 0402  levothyroxine (SYNTHROID) 112 MCG tablet  Daily before breakfast        04/25/23 0402    PARoxetine (PAXIL) 20 MG tablet  Daily        04/25/23 0402    traZODone (DESYREL) 50 MG tablet  Daily at bedtime        04/25/23 0402    Ambulatory Referral to Primary Care (Establish Care)        04/25/23 0403    Ambulatory referral to Gastroenterology        04/25/23 0403             Note:  This document was prepared using Dragon voice recognition software and may include unintentional dictation errors.    Pilar Jarvis, MD 04/25/23 [phone]

## 2023-04-25 NOTE — ED Notes (Signed)
 Pt taken to bathroom at this time via w/c by this EDT.

## 2023-04-25 NOTE — ED Notes (Signed)
 Urine sample obtained via clean catch. Urine clear and yellow with no foul odor noted. Sent to lab.

## 2023-04-25 NOTE — ED Notes (Signed)
 Pt medicated per order. Discharge paperwork and resources provided and discussed with pt via charge nurse. Pt questioned Dr. Modesto Charon regarding why scans of her abdomen weren't ordered. Dr. Modesto Charon addressed and discussed pt's concerns. Pt verbalized understanding. Chaplain at bedside speaking with pt at this time prior to discharge to ED lobby.

## 2023-04-25 NOTE — Discharge Instructions (Signed)
 Take medications as prescribed  I sent a referral to both a primary doctor and a GI specialist-they should contact you for an appointment.  See attached documents for housing resources.  Thank you for choosing [place] for your health care today!  Please see your primary doctor this week for a follow up appointment.   If you have any new, worsening, or unexpected symptoms call your doctor right away or come back to the emergency department for reevaluation.  It was my pleasure to care for you today.   Daneil Dan Modesto Charon, MD

## 2023-04-27 ENCOUNTER — Ambulatory Visit
Admission: EM | Admit: 2023-04-27 | Discharge: 2023-04-27 | Disposition: A | Discharge status: Home / Self Care | Attending: Emergency Medicine | Admitting: Emergency Medicine

## 2023-04-27 ENCOUNTER — Encounter: Payer: Self-pay | Admitting: Emergency Medicine

## 2023-04-27 ENCOUNTER — Other Ambulatory Visit
Admission: RE | Admit: 2023-04-27 | Discharge: 2023-04-27 | Disposition: A | Discharge status: Home / Self Care | Source: Ambulatory Visit | Attending: Emergency Medicine | Admitting: Emergency Medicine

## 2023-04-27 ENCOUNTER — Other Ambulatory Visit: Payer: Self-pay

## 2023-04-27 DIAGNOSIS — R5381 Other malaise: Secondary | ICD-10-CM | POA: Insufficient documentation

## 2023-04-27 LAB — BASIC METABOLIC PANEL WITH GFR
Anion gap: 10 (ref 5–15)
BUN: 14 mg/dL (ref 8–23)
CO2: 27 mmol/L (ref 22–32)
Calcium: 9.4 mg/dL (ref 8.9–10.3)
Chloride: 99 mmol/L (ref 98–111)
Creatinine, Ser: 0.68 mg/dL (ref 0.44–1.00)
GFR, Estimated: >60 mL/min (ref >60)
Glucose, Bld: 96 mg/dL (ref 70–99)
Potassium: 3.6 mmol/L (ref 3.5–5.1)
Sodium: 136 mmol/L (ref 135–145)

## 2023-04-27 NOTE — Discharge Instructions (Addendum)
 [facility] bar only appointment at 1245  EKG shows no changes from prior EKGs available in the chart, heart rate was slightly slower but on manual blood pressure pristine and manual count by this provider heart rate is within normal range  Blood work checking urine electrolytes, kidney and liver attempted but unfortunately were not able to complete this and you do not want to attempt blood work at [facility] may go to the [facility] outpatient lab to get your blood work drawn, closes at 5 PM and is not open on the weekends please go today  Sodium level 132 was seen on 04/24/2023, you endorsed during intake that your sodium levels are typically low, at this level we typically do not treat and monitor therefore blood work was attempted to be retaken today, Normal sodium levels are between 135 and 140  Has been placed for you for a GI specialist while in the emergency department on 04/24/2023 however you may also schedule a follow-up appointment with [facility] for further management of your GI symptoms you will need further workup and evaluation to determine and treat your symptoms  Primary care appointment has been made for you, please attempt to go to this appointment, may cancel if absolutely necessary

## 2023-04-27 NOTE — ED Provider Notes (Signed)
 Angela [name]    CSN: [id number] Arrival date & time: 04/27/23  1116      History   Chief Complaint Chief Complaint  Patient presents with   Follow-up    HPI Angela [name] is a 73 y.o. female.   Patient presents for evaluation of increased fatigue progressively worsening over the last 3 days.  Was recently evaluated in the hospital for nausea and vomiting and endorses after discharge she immediately slept for 18 hours, during a hospital stay endorses several episodes of falling asleep during the weight.  Has concerns regarding hyponatremia, was told levels were low while in emergency department and endorses chronic hyponatremia, endorses that she has been drinking increased fluids with water and electrolyte replacements but can feel the cramping of her muscles throughout the body which is a sign that levels must be low.  Nausea is persisting making it difficult to tolerate food and liquids.  Patient endorses that her current place of stay will not be able to resume and she is to become homeless.  Does not have PCP.  Endorses due to her prior lawsuit she is unable to use housing resources available throughout the city.   Past Medical History:  Diagnosis Date   Agitation 09/18/2017   Allergic rhinitis    Altered mental status, unspecified 12/25/2018   Anemia    Blurred vision    Chest pain of uncertain etiology 05/31/2018   Confusion state 12/25/2018   COPD (chronic obstructive pulmonary disease) (HCC)    Depression    Diverticulosis    Endometriosis    Falls    GERD (gastroesophageal reflux disease)    Hernia, hiatal    Hypothyroidism    IBS (irritable bowel syndrome)    Labile mood    Malignant neoplasm of skin    Malnutrition of mild degree (HCC) 08/08/2018   Migraine    Neuropathy    Noncompliance 12/25/2017   PTSD (post-traumatic stress disorder)    SIADH (syndrome of inappropriate ADH production) (HCC) 09/08/2016   Thyroid disease     Patient Active  Problem List   Diagnosis Date Noted   Stress reaction causing mixed disturbance of emotion and conduct 09/10/2021   Homeless    Mixed hyperlipidemia 07/02/2019   Hypothyroidism due to acquired atrophy of thyroid 04/29/2019   Osteopenia determined by x-ray 04/29/2019   Acquired claw toe of both feet 10/23/2018   Aortic atherosclerosis (HCC) 05/31/2018   Achilles tendon disorder 04/25/2018   Diastolic dysfunction 03/29/2018   Mitral regurgitation 03/29/2018   Personality disorder (HCC) 12/25/2017   Arthritis of hand, degenerative 12/07/2017   Hallux valgus, acquired 07/05/2017   Hammer toes of both feet 07/05/2017   Unstable ankle 07/05/2017   Tinea unguium 05/08/2017   Conductive hearing loss of right ear with unrestricted hearing of left ear 04/03/2017   Adjustment disorder with depressed mood 01/18/2017   Migraine 01/18/2017   Irritable bowel syndrome with constipation 11/17/2016   Bilateral carpal tunnel syndrome 11/17/2016   Plantar fasciitis, bilateral 11/17/2016   Cervical myofascial pain syndrome 11/17/2016   Chronic hyponatremia 11/17/2016   PTSD (post-traumatic stress disorder) 11/17/2016   Hiatal hernia 11/17/2016   Vitamin D deficiency 11/17/2016   Vitamin B12 deficiency 11/17/2016   Cervical spondylosis with myelopathy 05/24/2016   MCI (mild cognitive impairment) with memory loss 03/30/2016   Gait abnormality 03/30/2016   Chronic bipolar affective disorder (HCC) 01/24/2016   Anemia, iron deficiency 06/24/2015   Benign essential HTN 06/24/2015   History of  falling 05/22/2015   History of TIA (transient ischemic attack) 04/20/2015   Gastroesophageal reflux disease with hiatal hernia 04/20/2015   Cervical disc disorder at C5-C6 level with radiculopathy 03/25/2015   Chronic neck pain 03/25/2015   Pelvic pain in female 10/30/2014   History of skin cancer 06/30/2014   Chronic headaches 12/16/2013   Neuropathy 12/16/2013    Past Surgical History:  Procedure  Laterality Date   APPENDECTOMY     CHOLECYSTECTOMY     CHOLECYSTECTOMY, LAPAROSCOPIC     ESOPHAGOGASTRODUODENOSCOPY (EGD) WITH PROPOFOL N/A 03/29/2015   Procedure: ESOPHAGOGASTRODUODENOSCOPY (EGD) WITH PROPOFOL;  Surgeon: Elnita Maxwell, MD;  Location: [facility] ENDOSCOPY;  Service: Endoscopy;  Laterality: N/A;   HERNIA REPAIR     laproscopy     TONSILLECTOMY     uteral suspension      OB History     Gravida  1   Para      Term      Preterm      AB  1   Living  0      SAB      IAB  1   Ectopic      Multiple      Live Births               Home Medications    Prior to Admission medications   Medication Sig Start Date End Date Taking? Authorizing Provider  acetaminophen (TYLENOL) 650 MG CR tablet Take 650 mg by mouth every 8 (eight) hours as needed for pain.    [provider]  Acetylcysteine (NAC) 500 MG CAPS Take 500 mg by mouth 2 (two) times a day.    [provider]  albuterol (VENTOLIN HFA) 108 (90 Base) MCG/ACT inhaler albuterol sulfate HFA 90 mcg/actuation aerosol inhaler  INHALE 2 PUFFS BY MOUTH EVERY 4 HOURS AS NEEDED    [provider]  allopurinol (ZYLOPRIM) 100 MG tablet Take 1 tablet (100 mg total) by mouth daily. 04/25/23   Pilar Jarvis, MD  ascorbic acid (VITAMIN C) 1000 MG tablet Vitamin C 1,000 mg tablet  Take 1 tablet twice a day by oral route. Patient not taking: Reported on 03/06/2023    [provider]  calcium carbonate (OSCAL) 1500 (600 Ca) MG TABS tablet Calcium 600 mg calcium (1,500 mg) tablet  Take 1 tablet 3 times a day by oral route.    [provider]  Calcium Citrate (CITRACAL PO) Take 800 mg by mouth. Takes 2 (400mg ) tablets BID Patient not taking: Reported on 03/06/2023    [provider]  Cholecalciferol 100 MCG (4000 UT) CAPS Take 3 capsules by mouth. Patient not taking: Reported on 03/06/2023    [provider]  colchicine 0.6 MG tablet Take 2 tablets ( 1.2 mg ) then  in 1 hour take 1 tablet ( 0.6 mg),  then take 1 tablet ( 0.6 mg ) daily for 6 days Patient not taking: Reported on 03/06/2023 01/15/23   Valinda Hoar, NP  Cyanocobalamin (VITAMIN B-12) 1000 MCG SUBL Place 1,000 mcg under the tongue.  Patient not taking: Reported on 03/06/2023    [provider]  diclofenac Sodium (VOLTAREN) 1 % GEL Apply 4 g topically 4 (four) times daily. Patient not taking: Reported on 03/06/2023 01/31/23   Edwin Cap, DPM  EPINEPHrine 0.3 mg/0.3 mL IJ SOAJ injection Inject into the skin. 03/01/22   [provider]  famotidine (PEPCID) 20 MG tablet Take 1 tablet (20 mg total) by  mouth 2 (two) times daily as needed for heartburn or indigestion. 11/26/18   Sherlene Shams, MD  famotidine (PEPCID) 40 MG tablet TAKE 1 TABLET AT BEDTIME ORALLY ONCE A DAY 90 DAYS    [provider]  ferrous sulfate 325 (65 FE) MG tablet Take 1 tablet (325 mg total) by mouth daily with breakfast. 04/29/19   Reubin Milan, MD  hydrALAZINE (APRESOLINE) 25 MG tablet Take 1 tablet (25 mg total) by mouth 3 (three) times daily as needed (for pressure >150). 04/25/23 07/24/23  Pilar Jarvis, MD  HYDROcodone-acetaminophen (NORCO/VICODIN) 5-325 MG tablet Take 1 tablet by mouth every 6 (six) hours as needed. Patient not taking: Reported on 03/06/2023 04/19/20   [provider]  hydrOXYzine (ATARAX/VISTARIL) 10 MG tablet TAKE 1 TABLET BY MOUTH EVERY 8 HOURS AS NEEDED Patient not taking: Reported on 03/06/2023 06/04/19   Reubin Milan, MD  Icosapent Ethyl (VASCEPA) 1 g CAPS TAKE 2 CAPSULES BY MOUTH TWICE DAILY (TO HELP WITH TRIGLYCERIDES) 11/26/18   Sherlene Shams, MD  ivermectin (STROMECTOL) 3 MG TABS tablet TAKE FIVE TABLETS BY MOUTH ON AN EMPTY STOMACH. REPEAT IN ONE WEEK. WASH BEDDING, TOWELS, WORN CLOTHES IN HOT WATER AFTER EACH DOSE Patient not taking: Reported on 03/06/2023 07/08/19   Willeen Niece, MD  ketoconazole (NIZORAL) 2 % cream  02/04/19   [provider]   Lactulose 20 GM/30ML SOLN 30 ml every 4 hours until constipation is relieved Patient not taking: Reported on 03/06/2023 04/29/19   Reubin Milan, MD  levalbuterol Pauline Aus) 0.63 MG/3ML nebulizer solution Take 3 mLs (0.63 mg total) by nebulization every 4 (four) hours as needed for wheezing or shortness of breath. 04/29/19   Reubin Milan, MD  levothyroxine (SYNTHROID) 112 MCG tablet Take 1 tablet (112 mcg total) by mouth daily before breakfast. 04/25/23 07/24/23  Pilar Jarvis, MD  Lidocaine 4 % PTCH Aspercreme (lidocaine) 4 % topical patch Patient not taking: Reported on 03/06/2023    [provider]  linaclotide Karlene Einstein) 290 MCG CAPS capsule Take 1 capsule (290 mcg total) by mouth daily before breakfast. Patient not taking: Reported on 03/06/2023 12/26/17   Clapacs, Jackquline Denmark, MD  LORazepam (ATIVAN) 0.5 MG tablet TAKE 1 TABLET BY MOUTH AS NEEDED ONCE DAILY Patient not taking: Reported on 03/06/2023 02/06/18   [provider]  Magnesium 500 MG TABS Take 500 mg by mouth every other day.    [provider]  miconazole (MICOTIN) 200 MG vaginal suppository Place 1 suppository (200 mg total) vaginally at bedtime. Patient not taking: Reported on 03/06/2023 01/15/23   Valinda Hoar, NP  mupirocin ointment (BACTROBAN) 2 % Apply 1 application topically 3 (three) times daily. Patient not taking: Reported on 03/06/2023 02/04/19   [provider]  neomycin-bacitracin-polymyxin (NEOSPORIN) ointment Apply topically. Patient not taking: Reported on 03/06/2023 03/11/20   [provider]  nystatin (MYCOSTATIN/NYSTOP) powder Apply 1 Application topically 3 (three) times daily. 01/15/23   Valinda Hoar, NP  ofloxacin (FLOXIN) 0.3 % OTIC solution ofloxacin 0.3 % ear drops Patient not taking: Reported on 03/06/2023    [provider]  ondansetron (ZOFRAN) 4 MG tablet ondansetron HCl 4 mg tablet    [provider]  ondansetron (ZOFRAN-ODT) 4 MG  disintegrating tablet Take 1 tablet (4 mg total) by mouth every 8 (eight) hours as needed for nausea or vomiting. 04/25/23   Pilar Jarvis, MD  pantoprazole (PROTONIX) 40 MG tablet Take 1 tablet (40 mg total) by mouth daily.  04/25/23 04/24/24  Pilar Jarvis, MD  PARoxetine (PAXIL) 20 MG tablet Take 1 tablet (20 mg total) by mouth daily. 04/25/23   Pilar Jarvis, MD  polyethylene glycol-electrolytes (NULYTELY) 420 g solution     [provider]  prednisoLONE acetate (PRED FORTE) 1 % ophthalmic suspension INSTILL 1 DROP INTO EACH EYE TWICE DAILY Patient not taking: Reported on 03/06/2023 07/26/18   [provider]  PREMARIN vaginal cream INSERT ONE APPLICATORFUL VAGINALLY TWICE A WEEK. Patient not taking: Reported on 03/06/2023 05/04/21   Hildred Laser, MD  promethazine (PHENERGAN) 12.5 MG tablet Take 12.5 mg by mouth every 6 (six) hours as needed for nausea or vomiting. Patient not taking: Reported on 03/06/2023    [provider]  pyridoxine (B-6) 100 MG tablet Take 1 tablet (100 mg total) by mouth 2 (two) times daily. Patient not taking: Reported on 03/06/2023 12/26/17   Clapacs, Jackquline Denmark, MD  REFRESH 1.4-0.6 % SOLN Apply to eye. Patient not taking: Reported on 03/06/2023 03/17/20   [provider]  Senna-Natural Laxatives (SENOKOT LAXATIVE) Tea Bag MISC Drink 1 cup daily as needed for constipation. Adding 30ml of Lactulose to tea.    [provider]  sucralfate (CARAFATE) 1 GM/10ML suspension Take 10 mLs (1 g total) by mouth 4 (four) times daily. 04/25/23 04/24/24  Pilar Jarvis, MD  terconazole (TERAZOL 7) 0.4 % vaginal cream terconazole 0.4 % vaginal cream Patient not taking: Reported on 03/06/2023    [provider]  Tiotropium Bromide Monohydrate 2.5 MCG/ACT AERS Inhale into the lungs. 04/22/22 02/19/24  [provider]  tiZANidine (ZANAFLEX) 2 MG tablet Take by mouth every 6 (six) hours as needed for muscle spasms. Patient not taking: Reported on 03/06/2023     [provider]  traZODone (DESYREL) 50 MG tablet Take 0.5 tablets (25 mg total) by mouth at bedtime. 04/25/23   Pilar Jarvis, MD  triamcinolone ointment (KENALOG) 0.5 % Apply 1 application topically 2 (two) times daily. Patient not taking: Reported on 03/06/2023 11/26/18   Sherlene Shams, MD  umeclidinium bromide (INCRUSE ELLIPTA) 62.5 MCG/INH AEPB Inhale 1 puff into the lungs daily. Patient not taking: Reported on 03/06/2023 11/26/18   Sherlene Shams, MD  valsartan (DIOVAN) 320 MG tablet Take 320 mg by mouth daily.    [provider]  Vitamin D, Ergocalciferol, (DRISDOL) 1.25 MG (50000 UNIT) CAPS capsule Take 1 capsule (50,000 Units total) by mouth every 7 (seven) days. Patient not taking: Reported on 03/06/2023 05/19/19   Reubin Milan, MD  Benay Spice 5 % SOLN INSTILL 1 DROP INTO [facility] EYE TWICE DAILY Patient not taking: Reported on 03/06/2023 07/27/18   [provider]    Family History Family History  Problem Relation Age of Onset   Heart disease Father    Hearing loss Father    COPD Father    Depression Father    Stroke Father    Vision loss Father    Varicose Veins Father    Cancer Mother        lung   Depression Sister    Arthritis Sister    Arthritis Brother    Hernia Brother    Anxiety disorder Brother    Arthritis Brother    Hernia Brother    Anxiety disorder Brother    Urolithiasis Neg Hx    Kidney disease Neg Hx    Kidney cancer Neg Hx    Prostate cancer Neg Hx    Breast cancer Neg Hx  Social History Social History   Tobacco Use   Smoking status: Former    Current packs/day: 0.00    Types: Cigarettes    Quit date: 11/17/1976    Years since quitting: 46.4   Smokeless tobacco: Never  Vaping Use   Vaping status: Never Used  Substance Use Topics   Alcohol use: Yes    Comment: occ   Drug use: No     Allergies   Amoxicillin-pot clavulanate, Aspirin, Bee venom, Cephalexin, Dicyclomine, Fluticasone-salmeterol, Haloperidol, Molds &  smuts, Other, Oxycodone-acetaminophen, Peanut butter flavoring agent (non-screening), Peanuts [peanut oil], Penicillin g, Penicillins, Propoxyphene, Sulfa antibiotics, Sulfacetamide sodium, Tramadol, Amikacin, Bee pollen, Imipramine, Prednisone, Sulfur, Azithromycin, Doxycycline, Elemental sulfur, Fluticasone, Honey bee venom, Hymenoptera venom preparations, Lactose, Lactose intolerance (gi), Peanut-containing drug products, Phthalylsulfacetamide, Pregabalin, Risperidone, Sulfacetamide, Wound dressing adhesive, Adhesive [tape], Albuterol, Ciprofloxacin, and Hydroxyzine   Review of Systems Review of Systems   Physical Exam Triage Vital Signs ED Triage Vitals [04/27/23 1206]  Encounter Vitals Group     BP (!) 184/96     Systolic BP Percentile      Diastolic BP Percentile      Pulse Rate 62     Resp 20     Temp (!) 96.9 F (36.1 C)     Temp Source Temporal     SpO2 94 %     Weight      Height      Head Circumference      Peak Flow      Pain Score      Pain Loc      Pain Education      Exclude from Growth Chart    No data found.  Updated Vital Signs BP (!) 184/96 (BP Location: Left Arm)   Pulse 62   Temp (!) 96.9 F (36.1 C) (Temporal)   Resp 20   SpO2 94%   Visual Acuity Right Eye Distance:   Left Eye Distance:   Bilateral Distance:    Right Eye Near:   Left Eye Near:    Bilateral Near:     Physical Exam Constitutional:      Appearance: Normal appearance.  Eyes:     Extraocular Movements: Extraocular movements intact.  Cardiovascular:     Rate and Rhythm: Normal rate and regular rhythm.     Pulses: Normal pulses.     Heart sounds: Normal heart sounds.  Pulmonary:     Effort: Pulmonary effort is normal.     Breath sounds: Normal breath sounds.  Neurological:     Mental Status: She is alert and oriented to person, place, and time. Mental status is at baseline.      UC Treatments / Results  Labs (all labs ordered are listed, but only abnormal results are  displayed) Labs Reviewed  BASIC METABOLIC PANEL WITH GFR    EKG   Radiology No results found.  Procedures Procedures (including critical care time)  Medications Ordered in UC Medications - No data to display  Initial Impression / Assessment and Plan / UC Course  I have reviewed the triage vital signs and the nursing notes.  Pertinent labs & imaging results that were available during my care of the patient were reviewed by me and considered in my medical decision making (see chart for details).  Malaise  Blood pressure elevated however patient is very anxious and emotional when discussing home life and impending homelessness as she endorses her roommate is kicking her out, endorses that she has already been  given all of the resources within the county area and is unable to use this housing, EKG shows sinus bradycardia however on manual pulse check heart rate is 65 and on Dinamap 62, stable, blood work obtained on 04/24/2023 in the ED shows sodium level of 132, stable, discussed this with patient several times throughout visit that she does not need IV fluids at this time and furthermore that they will not be given in this urgent care setting, recommended continued intake of water and electrolyte replacements if she is feeling dehydrated, BMP attempted to be drawn 4 times by nursing staff, given information to the outpatient clinic for blood to be drawn today as she is requesting recheck, GI referral given during ED visit on 04/24/2023, also given information to [facility] as a walking referral as patient endorses she has a known ulcer and persisting nausea, offered Zofran which she endorses she already has available at home, offered Reglan and Phenergan as an alternative, declined, PCP appointment made, strongly strongly advised patient to keep upcoming appointment Final Clinical Impressions(s) / UC Diagnoses   Final diagnoses:  Malaise     Discharge Instructions      [facility]  only appointment at 1245  EKG shows no changes from prior EKGs available in the chart, heart rate was slightly slower but on manual blood pressure pristine and manual count by this provider heart rate is within normal range  Blood work checking urine electrolytes, kidney and liver attempted but unfortunately were not able to complete this and you do not want to attempt blood work at [facility] may go to the [facility] outpatient lab to get your blood work drawn, closes at 5 PM and is not open on the weekends please go today  Sodium level 132 was seen on 04/24/2023, you endorsed during intake that your sodium levels are typically low, at this level we typically do not treat and monitor therefore blood work was attempted to be retaken today, Normal sodium levels are between 135 and 140  Has been placed for you for a GI specialist while in the emergency department on 04/24/2023 however you may also schedule a follow-up appointment with [facility] for further management of your GI symptoms you will need further workup and evaluation to determine and treat your symptoms  Primary care appointment has been made for you, please attempt to go to this appointment, may cancel if absolutely necessary    ED Prescriptions   None    PDMP not reviewed this encounter.   Valinda Hoar, NP 04/27/23 1455

## 2023-04-27 NOTE — ED Triage Notes (Signed)
 Patient reviewed concerns and background with provider, did not want to discuss with other staff

## 2023-04-27 NOTE — ED Notes (Signed)
 This RN attempted for blood draw x 2 without success.  Patient offered [facility] draw, states she would prefer RT try before that.  Tiffany, RT aware

## 2023-05-05 ENCOUNTER — Other Ambulatory Visit: Payer: Self-pay

## 2023-05-05 ENCOUNTER — Emergency Department

## 2023-05-05 ENCOUNTER — Ambulatory Visit
Admission: EM | Admit: 2023-05-05 | Discharge: 2023-05-05 | Disposition: A | Discharge status: Home / Self Care | Attending: Emergency Medicine | Admitting: Emergency Medicine

## 2023-05-05 ENCOUNTER — Emergency Department
Admission: EM | Admit: 2023-05-05 | Discharge: 2023-05-07 | Disposition: A | Discharge status: Home / Self Care | Attending: Emergency Medicine | Admitting: Emergency Medicine

## 2023-05-05 DIAGNOSIS — I160 Hypertensive urgency: Secondary | ICD-10-CM

## 2023-05-05 DIAGNOSIS — E039 Hypothyroidism, unspecified: Secondary | ICD-10-CM | POA: Diagnosis not present

## 2023-05-05 DIAGNOSIS — R202 Paresthesia of skin: Secondary | ICD-10-CM | POA: Insufficient documentation

## 2023-05-05 DIAGNOSIS — J449 Chronic obstructive pulmonary disease, unspecified: Secondary | ICD-10-CM | POA: Insufficient documentation

## 2023-05-05 DIAGNOSIS — Z5900 Homelessness unspecified: Secondary | ICD-10-CM

## 2023-05-05 DIAGNOSIS — R519 Headache, unspecified: Secondary | ICD-10-CM | POA: Diagnosis present

## 2023-05-05 DIAGNOSIS — I10 Essential (primary) hypertension: Secondary | ICD-10-CM | POA: Diagnosis not present

## 2023-05-05 DIAGNOSIS — H109 Unspecified conjunctivitis: Secondary | ICD-10-CM | POA: Diagnosis not present

## 2023-05-05 DIAGNOSIS — H1031 Unspecified acute conjunctivitis, right eye: Secondary | ICD-10-CM

## 2023-05-05 LAB — BASIC METABOLIC PANEL WITH GFR
Anion gap: 11 (ref 5–15)
BUN: 13 mg/dL (ref 8–23)
CO2: 27 mmol/L (ref 22–32)
Calcium: 9.4 mg/dL (ref 8.9–10.3)
Chloride: 99 mmol/L (ref 98–111)
Creatinine, Ser: 0.72 mg/dL (ref 0.44–1.00)
GFR, Estimated: >60 mL/min (ref >60)
Glucose, Bld: 90 mg/dL (ref 70–99)
Potassium: 3.4 mmol/L — ABNORMAL LOW (ref 3.5–5.1)
Sodium: 137 mmol/L (ref 135–145)

## 2023-05-05 LAB — CBC
HCT: 37.2 % (ref 36.0–46.0)
Hemoglobin: 12.7 g/dL (ref 12.0–15.0)
MCH: 30.5 pg (ref 26.0–34.0)
MCHC: 34.1 g/dL (ref 30.0–36.0)
MCV: 89.2 fL (ref 80.0–100.0)
Platelets: 295 10*3/uL (ref 150–400)
RBC: 4.17 MIL/uL (ref 3.87–5.11)
RDW: 14 % (ref 11.5–15.5)
WBC: 5.1 10*3/uL (ref 4.0–10.5)
nRBC: 0 % (ref 0.0–0.2)

## 2023-05-05 MED ORDER — POLYMYXIN B-TRIMETHOPRIM 10000-0.1 UNIT/ML-% OP SOLN
1.0000 [drp] | OPHTHALMIC | Status: DC
  Administered 2023-05-05 – 2023-05-06 (×5): 1 [drp] via OPHTHALMIC
  Filled 2023-05-05: qty 10

## 2023-05-05 MED ORDER — HYDRALAZINE HCL 50 MG PO TABS
50.0000 mg | ORAL_TABLET | Freq: Once | ORAL | Status: AC
  Administered 2023-05-05: 50 mg via ORAL
  Filled 2023-05-05: qty 1

## 2023-05-05 MED ORDER — CLONIDINE HCL 0.1 MG PO TABS
0.1000 mg | ORAL_TABLET | Freq: Once | ORAL | Status: DC
  Filled 2023-05-05: qty 1

## 2023-05-05 MED ORDER — LORAZEPAM 1 MG PO TABS
1.0000 mg | ORAL_TABLET | Freq: Once | ORAL | Status: AC
  Administered 2023-05-06: 1 mg via ORAL
  Filled 2023-05-05: qty 1

## 2023-05-05 MED ORDER — POLYMYXIN B-TRIMETHOPRIM 10000-0.1 UNIT/ML-% OP SOLN: 1.0000 [drp] | Freq: Four times a day (QID) | OPHTHALMIC | 0 refills | Status: AC

## 2023-05-05 NOTE — ED Triage Notes (Addendum)
 Patient to Urgent Care with complaints of right sided eye pain/ drainage. Believes she may have scratched her eye when putting on a surgical mask. Reports eye pain is very intense.   Started started last night. Reports intermittent over the last 6 months.

## 2023-05-05 NOTE — Discharge Instructions (Addendum)
 Go to the emergency department for evaluation of your very high blood pressure which is 187/95.    Use the eyedrops as directed.  See the attached handout on bacterial conjunctivitis.

## 2023-05-05 NOTE — ED Provider Notes (Signed)
 Angela [name]    CSN: [id number] Arrival date & time: 05/05/23  1514      History   Chief Complaint Chief Complaint  Patient presents with   Eye Problem    HPI Angela [name] is a 73 y.o. female.  Patient presents with right eye pain and drainage since last night.  No eye trauma, change in vision, fever.  No treatments attempted.  Patient denies chest pain or shortness of breath.  Patient was seen at [facility] ED on 04/25/2023; diagnosed with nausea and vomiting, bilateral foot pain, hypothyroidism, epigastric pain.  She was seen at this urgent care on 04/27/2023; diagnosed with malaise.  She was seen by ENT on 04/30/2023; diagnosed with hoarseness, facial pain, allergic rhinitis, arthralgia.  The history is provided by the patient and medical records.    Past Medical History:  Diagnosis Date   Agitation 09/18/2017   Allergic rhinitis    Altered mental status, unspecified 12/25/2018   Anemia    Blurred vision    Chest pain of uncertain etiology 05/31/2018   Confusion state 12/25/2018   COPD (chronic obstructive pulmonary disease) (HCC)    Depression    Diverticulosis    Endometriosis    Falls    GERD (gastroesophageal reflux disease)    Hernia, hiatal    Hypothyroidism    IBS (irritable bowel syndrome)    Labile mood    Malignant neoplasm of skin    Malnutrition of mild degree (HCC) 08/08/2018   Migraine    Neuropathy    Noncompliance 12/25/2017   PTSD (post-traumatic stress disorder)    SIADH (syndrome of inappropriate ADH production) (HCC) 09/08/2016   Thyroid  disease     Patient Active Problem List   Diagnosis Date Noted   Stress reaction causing mixed disturbance of emotion and conduct 09/10/2021   Homeless    Mixed hyperlipidemia 07/02/2019   Hypothyroidism due to acquired atrophy of thyroid  04/29/2019   Osteopenia determined by x-ray 04/29/2019   Acquired claw toe of both feet 10/23/2018   Aortic atherosclerosis (HCC) 05/31/2018   Achilles tendon  disorder 04/25/2018   Diastolic dysfunction 03/29/2018   Mitral regurgitation 03/29/2018   Personality disorder (HCC) 12/25/2017   Arthritis of hand, degenerative 12/07/2017   Hallux valgus, acquired 07/05/2017   Hammer toes of both feet 07/05/2017   Unstable ankle 07/05/2017   Tinea unguium 05/08/2017   Conductive hearing loss of right ear with unrestricted hearing of left ear 04/03/2017   Adjustment disorder with depressed mood 01/18/2017   Migraine 01/18/2017   Irritable bowel syndrome with constipation 11/17/2016   Bilateral carpal tunnel syndrome 11/17/2016   Plantar fasciitis, bilateral 11/17/2016   Cervical myofascial pain syndrome 11/17/2016   Chronic hyponatremia 11/17/2016   PTSD (post-traumatic stress disorder) 11/17/2016   Hiatal hernia 11/17/2016   Vitamin D  deficiency 11/17/2016   Vitamin B12 deficiency 11/17/2016   Cervical spondylosis with myelopathy 05/24/2016   MCI (mild cognitive impairment) with memory loss 03/30/2016   Gait abnormality 03/30/2016   Chronic bipolar affective disorder (HCC) 01/24/2016   Anemia, iron deficiency 06/24/2015   Benign essential HTN 06/24/2015   History of falling 05/22/2015   History of TIA (transient ischemic attack) 04/20/2015   Gastroesophageal reflux disease with hiatal hernia 04/20/2015   Cervical disc disorder at C5-C6 level with radiculopathy 03/25/2015   Chronic neck pain 03/25/2015   Pelvic pain in female 10/30/2014   History of skin cancer 06/30/2014   Chronic headaches 12/16/2013   Neuropathy 12/16/2013  Past Surgical History:  Procedure Laterality Date   APPENDECTOMY     CHOLECYSTECTOMY     CHOLECYSTECTOMY, LAPAROSCOPIC     ESOPHAGOGASTRODUODENOSCOPY (EGD) WITH PROPOFOL  N/A 03/29/2015   Procedure: ESOPHAGOGASTRODUODENOSCOPY (EGD) WITH PROPOFOL ;  Surgeon: Luella Sager, MD;  Location: [facility] ENDOSCOPY;  Service: Endoscopy;  Laterality: N/A;   HERNIA REPAIR     laproscopy     TONSILLECTOMY     uteral  suspension      OB History     Gravida  1   Para      Term      Preterm      AB  1   Living  0      SAB      IAB  1   Ectopic      Multiple      Live Births               Home Medications    Prior to Admission medications   Medication Sig Start Date End Date Taking? Authorizing Provider  trimethoprim -polymyxin b  (POLYTRIM ) ophthalmic solution Place 1 drop into both eyes 4 (four) times daily for 7 days. 05/05/23 05/12/23 Yes Wellington Half, NP  acetaminophen  (TYLENOL ) 650 MG CR tablet Take 650 mg by mouth every 8 (eight) hours as needed for pain.    [provider]  Acetylcysteine (NAC) 500 MG CAPS Take 500 mg by mouth 2 (two) times a day.    [provider]  albuterol  (VENTOLIN  HFA) 108 (90 Base) MCG/ACT inhaler albuterol  sulfate HFA 90 mcg/actuation aerosol inhaler  INHALE 2 PUFFS BY MOUTH EVERY 4 HOURS AS NEEDED    [provider]  allopurinol  (ZYLOPRIM ) 100 MG tablet Take 1 tablet (100 mg total) by mouth daily. 04/25/23   Buell Carmin, MD  ascorbic acid  (VITAMIN C ) 1000 MG tablet Vitamin C  1,000 mg tablet  Take 1 tablet twice a day by oral route. Patient not taking: Reported on 03/06/2023    [provider]  calcium  carbonate (OSCAL) 1500 (600 Ca) MG TABS tablet Calcium  600 mg calcium  (1,500 mg) tablet  Take 1 tablet 3 times a day by oral route.    [provider]  Calcium  Citrate (CITRACAL PO) Take 800 mg by mouth. Takes 2 (400mg ) tablets BID Patient not taking: Reported on 03/06/2023    [provider]  Cholecalciferol  100 MCG (4000 UT) CAPS Take 3 capsules by mouth. Patient not taking: Reported on 03/06/2023    [provider]  colchicine  0.6 MG tablet Take 2 tablets ( 1.2 mg ) then in 1 hour take 1 tablet ( 0.6 mg),  then take 1 tablet ( 0.6 mg ) daily for 6 days Patient not taking: Reported on 03/06/2023 01/15/23   [name], NP  Cyanocobalamin  (VITAMIN B-12) 1000 MCG SUBL Place 1,000 mcg  under the tongue.  Patient not taking: Reported on 03/06/2023    [provider]  diclofenac  Sodium (VOLTAREN ) 1 % GEL Apply 4 g topically 4 (four) times daily. Patient not taking: Reported on 03/06/2023 01/31/23   Floyce Hutching, DPM  EPINEPHrine  0.3 mg/0.3 mL IJ SOAJ injection Inject into the skin. 03/01/22   [provider]  famotidine  (PEPCID ) 20 MG tablet Take 1 tablet (20 mg total) by mouth 2 (two) times daily as needed for heartburn or indigestion. 11/26/18   Thersia Flax, MD  famotidine  (PEPCID ) 40 MG tablet TAKE 1 TABLET AT BEDTIME ORALLY ONCE A DAY 90 DAYS  [provider]  ferrous sulfate  325 (65 FE) MG tablet Take 1 tablet (325 mg total) by mouth daily with breakfast. 04/29/19   Sheron Dixons, MD  hydrALAZINE  (APRESOLINE ) 25 MG tablet Take 1 tablet (25 mg total) by mouth 3 (three) times daily as needed (for pressure >150). 04/25/23 07/24/23  Buell Carmin, MD  HYDROcodone -acetaminophen  (NORCO/VICODIN) 5-325 MG tablet Take 1 tablet by mouth every 6 (six) hours as needed. Patient not taking: Reported on 03/06/2023 04/19/20   [provider]  hydrOXYzine  (ATARAX /VISTARIL ) 10 MG tablet TAKE 1 TABLET BY MOUTH EVERY 8 HOURS AS NEEDED Patient not taking: Reported on 03/06/2023 06/04/19   Sheron Dixons, MD  Icosapent  Ethyl (VASCEPA ) 1 g CAPS TAKE 2 CAPSULES BY MOUTH TWICE DAILY (TO HELP WITH TRIGLYCERIDES) 11/26/18   Tullo, Teresa L, MD  ivermectin (STROMECTOL) 3 MG TABS tablet TAKE FIVE TABLETS BY MOUTH ON AN EMPTY STOMACH. REPEAT IN ONE WEEK. WASH BEDDING, TOWELS, WORN CLOTHES IN HOT WATER AFTER EACH DOSE Patient not taking: Reported on 03/06/2023 07/08/19   [name], MD  ketoconazole (NIZORAL) 2 % cream  02/04/19   [provider]  Lactulose  20 GM/30ML SOLN 30 ml every 4 hours until constipation is relieved Patient not taking: Reported on 03/06/2023 04/29/19   Sheron Dixons, MD  levalbuterol  (XOPENEX ) 0.63 MG/3ML nebulizer solution Take 3 mLs  (0.63 mg total) by nebulization every 4 (four) hours as needed for wheezing or shortness of breath. 04/29/19   Sheron Dixons, MD  levothyroxine  (SYNTHROID ) 112 MCG tablet Take 1 tablet (112 mcg total) by mouth daily before breakfast. 04/25/23 07/24/23  Buell Carmin, MD  Lidocaine  4 % PTCH Aspercreme (lidocaine ) 4 % topical patch Patient not taking: Reported on 03/06/2023    [provider]  linaclotide  (LINZESS ) 290 MCG CAPS capsule Take 1 capsule (290 mcg total) by mouth daily before breakfast. Patient not taking: Reported on 03/06/2023 12/26/17   Clapacs, Elida Grounds, MD  LORazepam  (ATIVAN ) 0.5 MG tablet TAKE 1 TABLET BY MOUTH AS NEEDED ONCE DAILY Patient not taking: Reported on 03/06/2023 02/06/18   [provider]  Magnesium  500 MG TABS Take 500 mg by mouth every other day.    [provider]  miconazole  (MICOTIN) 200 MG vaginal suppository Place 1 suppository (200 mg total) vaginally at bedtime. Patient not taking: Reported on 03/06/2023 01/15/23   [name], NP  mupirocin ointment (BACTROBAN) 2 % Apply 1 application topically 3 (three) times daily. Patient not taking: Reported on 03/06/2023 02/04/19   [provider]  neomycin-bacitracin -polymyxin (NEOSPORIN) ointment Apply topically. Patient not taking: Reported on 03/06/2023 03/11/20   [provider]  nystatin  (MYCOSTATIN /NYSTOP ) powder Apply 1 Application topically 3 (three) times daily. 01/15/23   White, Maybelle Spatz, NP  ondansetron  (ZOFRAN ) 4 MG tablet ondansetron  HCl 4 mg tablet    [provider]  ondansetron  (ZOFRAN -ODT) 4 MG disintegrating tablet Take 1 tablet (4 mg total) by mouth every 8 (eight) hours as needed for nausea or vomiting. 04/25/23   Buell Carmin, MD  pantoprazole  (PROTONIX ) 40 MG tablet Take 1 tablet (40 mg total) by mouth daily. 04/25/23 04/24/24  Buell Carmin, MD  PARoxetine  (PAXIL ) 20 MG tablet Take 1 tablet (20 mg total) by mouth daily. 04/25/23   Buell Carmin, MD  polyethylene  glycol-electrolytes (NULYTELY ) 420 g solution     [provider]  prednisoLONE acetate (PRED FORTE) 1 % ophthalmic suspension INSTILL 1 DROP INTO EACH EYE TWICE DAILY Patient not taking: Reported  on 03/06/2023 07/26/18   [provider]  PREMARIN  vaginal cream INSERT ONE APPLICATORFUL VAGINALLY TWICE A WEEK. Patient not taking: Reported on 03/06/2023 05/04/21   Teresa Fender, MD  promethazine  (PHENERGAN ) 12.5 MG tablet Take 12.5 mg by mouth every 6 (six) hours as needed for nausea or vomiting. Patient not taking: Reported on 03/06/2023    [provider]  pyridoxine  (B-6) 100 MG tablet Take 1 tablet (100 mg total) by mouth 2 (two) times daily. Patient not taking: Reported on 03/06/2023 12/26/17   Clapacs, Elida Grounds, MD  REFRESH 1.4-0.6 % SOLN Apply to eye. Patient not taking: Reported on 03/06/2023 03/17/20   [provider]  Senna-Natural Laxatives (SENOKOT LAXATIVE) Tea Bag MISC Drink 1 cup daily as needed for constipation. Adding 30ml of Lactulose  to tea.    [provider]  sucralfate  (CARAFATE ) 1 GM/10ML suspension Take 10 mLs (1 g total) by mouth 4 (four) times daily. 04/25/23 04/24/24  Buell Carmin, MD  terconazole  (TERAZOL 7 ) 0.4 % vaginal cream terconazole  0.4 % vaginal cream Patient not taking: Reported on 03/06/2023    [provider]  Tiotropium Bromide  Monohydrate 2.5 MCG/ACT AERS Inhale into the lungs. 04/22/22 02/19/24  [provider]  tiZANidine  (ZANAFLEX ) 2 MG tablet Take by mouth every 6 (six) hours as needed for muscle spasms. Patient not taking: Reported on 03/06/2023    [provider]  traZODone  (DESYREL ) 50 MG tablet Take 0.5 tablets (25 mg total) by mouth at bedtime. 04/25/23   Buell Carmin, MD  triamcinolone  ointment (KENALOG ) 0.5 % Apply 1 application topically 2 (two) times daily. Patient not taking: Reported on 03/06/2023 11/26/18   Thersia Flax, MD  umeclidinium bromide  (INCRUSE ELLIPTA ) 62.5 MCG/INH AEPB Inhale 1  puff into the lungs daily. Patient not taking: Reported on 03/06/2023 11/26/18   Thersia Flax, MD  valsartan (DIOVAN) 320 MG tablet Take 320 mg by mouth daily.    [provider]  Vitamin D , Ergocalciferol , (DRISDOL ) 1.25 MG (50000 UNIT) CAPS capsule Take 1 capsule (50,000 Units total) by mouth every 7 (seven) days. Patient not taking: Reported on 03/06/2023 05/19/19   Sheron Dixons, MD  Clarke Crouch 5 % SOLN INSTILL 1 DROP INTO EACH EYE TWICE DAILY Patient not taking: Reported on 03/06/2023 07/27/18   [provider]    Family History Family History  Problem Relation Age of Onset   Heart disease Father    Hearing loss Father    COPD Father    Depression Father    Stroke Father    Vision loss Father    Varicose Veins Father    Cancer Mother        lung   Depression Sister    Arthritis Sister    Arthritis Brother    Hernia Brother    Anxiety disorder Brother    Arthritis Brother    Hernia Brother    Anxiety disorder Brother    Urolithiasis Neg Hx    Kidney disease Neg Hx    Kidney cancer Neg Hx    Prostate cancer Neg Hx    Breast cancer Neg Hx     Social History Social History   Tobacco Use   Smoking status: Former    Current packs/day: 0.00    Types: Cigarettes    Quit date: 11/17/1976    Years since quitting: 46.4   Smokeless tobacco: Never  Vaping Use   Vaping status: Never Used  Substance Use Topics   Alcohol  use: Yes  Comment: occ   Drug use: No     Allergies   Amoxicillin -pot clavulanate, Aspirin, Bee venom, Cephalexin , Dicyclomine , Fluticasone-salmeterol, Haloperidol , Molds & smuts, Other, Oxycodone-acetaminophen , Peanut butter flavoring agent (non-screening), Peanuts [peanut oil], Penicillin g, Penicillins, Propoxyphene, Sulfa antibiotics, Sulfacetamide sodium, Tramadol, Amikacin, Bee pollen, Imipramine, Prednisone, Sulfur, Azithromycin , Colchicine , Doxycycline , Elemental sulfur, Fluticasone, Honey bee venom, Hymenoptera venom preparations,  Lactose, Lactose intolerance (gi), Peanut-containing drug products, Phthalylsulfacetamide, Pregabalin, Risperidone, Sulfacetamide, Wound dressing adhesive, Adhesive [tape], Albuterol , Ciprofloxacin , and Hydroxyzine    Review of Systems Review of Systems  Constitutional:  Negative for chills and fever.  Eyes:  Positive for discharge, redness and itching. Negative for pain and visual disturbance.     Physical Exam Triage Vital Signs ED Triage Vitals  Encounter Vitals Group     BP      Systolic BP Percentile      Diastolic BP Percentile      Pulse      Resp      Temp      Temp src      SpO2      Weight      Height      Head Circumference      Peak Flow      Pain Score      Pain Loc      Pain Education      Exclude from Growth Chart    No data found.  Updated Vital Signs BP (!) 187/95   Pulse 63   Temp 97.7 F (36.5 C)   Resp 18   SpO2 95%   Visual Acuity Right Eye Distance:   Left Eye Distance:   Bilateral Distance:    Right Eye Near:   Left Eye Near:    Bilateral Near:     Physical Exam Constitutional:      General: She is not in acute distress. HENT:     Mouth/Throat:     Mouth: Mucous membranes are moist.  Eyes:     General: Lids are normal. Vision grossly intact.     Conjunctiva/sclera:     Right eye: Right conjunctiva is injected.  Cardiovascular:     Rate and Rhythm: Normal rate and regular rhythm.     Heart sounds: Normal heart sounds.  Pulmonary:     Effort: Pulmonary effort is normal. No respiratory distress.     Breath sounds: Normal breath sounds.  Neurological:     Mental Status: She is alert.      UC Treatments / Results  Labs (all labs ordered are listed, but only abnormal results are displayed) Labs Reviewed - No data to display  EKG   Radiology No results found.  Procedures Procedures (including critical care time)  Medications Ordered in UC Medications - No data to display  Initial Impression / Assessment and Plan /  UC Course  I have reviewed the triage vital signs and the nursing notes.  Pertinent labs & imaging results that were available during my care of the patient were reviewed by me and considered in my medical decision making (see chart for details).    Hypertensive urgency.  Acute conjunctivitis of right eye.  Blood pressure 187/95.  Instructed patient to go to the emergency department for evaluation of her hypertensive urgency.  Treating her eye symptoms with Polytrim  eyedrops.  Education provided on bacterial conjunctivitis.  Instructed her to follow-up with her PCP or eye care provider if her eye symptoms are not improving.  She agrees to plan of care.  Final Clinical Impressions(s) / UC Diagnoses   Final diagnoses:  Hypertensive urgency  Acute bacterial conjunctivitis of right eye     Discharge Instructions      Go to the emergency department for evaluation of your very high blood pressure which is 187/95.    Use the eyedrops as directed.  See the attached handout on bacterial conjunctivitis.     ED Prescriptions     Medication Sig Dispense Auth. Provider   trimethoprim -polymyxin b  (POLYTRIM ) ophthalmic solution Place 1 drop into both eyes 4 (four) times daily for 7 days. 10 mL Wellington Half, NP      PDMP not reviewed this encounter.   Wellington Half, NP 05/05/23 [phone]
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
## 2023-05-05 NOTE — ED Notes (Signed)
 Pt requested O2 to be checked.
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
## 2023-05-05 NOTE — ED Provider Notes (Signed)
 [facility] Provider Note   Event Date/Time   First MD Initiated Contact with Patient 05/05/23 2058     (approximate) History  Hypertension  HPI Angela [name] is a 73 y.o. female with a past medical history of endometriosis, hypothyroidism, IBS, COPD, PTSD, and hypertension who presents complaining of multiple symptoms including pain to the right eye and was diagnosed with conjunctivitis at urgent care prior to arrival.  Patient was also told urgent care that she was extremely hypertensive and to go to the emergency department if she does not have any of her normal hypertensive medications.  Patient states that she has been having headache and paresthesias in bilateral upper and lower extremities over the last 24 hours.  Patient also states that she is homeless and needs to use a walker and therefore has no place to go. ROS: Patient currently denies any vision changes, tinnitus, difficulty speaking, facial droop, sore throat, chest pain, shortness of breath, abdominal pain, nausea/vomiting/diarrhea, dysuria, or weakness/numbness/paresthesias in any extremity   Physical Exam  Triage Vital Signs: ED Triage Vitals [05/05/23 1944]  Encounter Vitals Group     BP (!) 180/78     Systolic BP Percentile      Diastolic BP Percentile      Pulse Rate 68     Resp 16     Temp 98 F (36.7 C)     Temp Source Oral     SpO2 98 %     Weight 143 lb 4.8 oz (65 kg)     Height 5\' 3"  (1.6 m)     Head Circumference      Peak Flow      Pain Score 0     Pain Loc      Pain Education      Exclude from Growth Chart    Most recent vital signs: Vitals:   05/05/23 2028 05/05/23 2115  BP:  (!) 198/98  Pulse: 65 71  Resp:    Temp:    SpO2: 100% 100%   General: Awake, oriented x4. CV:  Good peripheral perfusion.  Resp:  Normal effort.  Abd:  No distention.  Other:  Elderly overweight Caucasian female resting comfortably in no acute distress ED Results / Procedures /  Treatments  Labs (all labs ordered are listed, but only abnormal results are displayed) Labs Reviewed  BASIC METABOLIC PANEL WITH GFR - Abnormal; Notable for the following components:      Result Value   Potassium 3.4 (*)    All other components within normal limits  CBC  URINALYSIS, ROUTINE W REFLEX MICROSCOPIC   ED MD interpretation: Head CT pending -Agree with radiology assessment Official radiology report(s): No results found. PROCEDURES: Critical Care performed: No Procedures MEDICATIONS ORDERED IN ED: Medications  trimethoprim -polymyxin b  (POLYTRIM ) ophthalmic solution 1 drop (1 drop Right Eye Given 05/05/23 2220)  cloNIDine  (CATAPRES ) tablet 0.1 mg (0.1 mg Oral Not Given 05/05/23 2201)  LORazepam  (ATIVAN ) tablet 1 mg (has no administration in time range)  hydrALAZINE  (APRESOLINE ) tablet 50 mg (50 mg Oral Given 05/05/23 2201)   IMPRESSION / MDM / ASSESSMENT AND PLAN / ED COURSE  I reviewed the triage vital signs and the nursing notes.                             The patient is on the cardiac monitor to evaluate for evidence of arrhythmia and/or significant heart rate changes. Patient's presentation  is most consistent with acute presentation with potential threat to life or bodily function. Presents to the emergency department complaining of high blood pressure. Patient is otherwise asymptomatic without confusion, chest pain, hematuria, or SOB. Endorses nonadherence to antihypertensive regimen DDx: CV, AMI, heart failure, renal infarction or failure or other end organ damage. Patient states that she has no safe place to be discharged to given that she uses a walker and has lost her caregiver.  Patient will need TOC consult for safe discharge Disposition: Care of this patient will be signed out to the oncoming physician at the end of my shift.  All pertinent patient information conveyed and all questions answered.  All further care and disposition decisions will be made by the  oncoming physician.   FINAL CLINICAL IMPRESSION(S) / ED DIAGNOSES   Final diagnoses:  Uncontrolled hypertension  Conjunctivitis of right eye, unspecified conjunctivitis type   Rx / DC Orders   ED Discharge Orders     None      Note:  This document was prepared using Dragon voice recognition software and may include unintentional dictation errors.   Nance Mccombs K, MD 05/06/23 Nettie Barb

## 2023-05-06 DIAGNOSIS — I10 Essential (primary) hypertension: Secondary | ICD-10-CM | POA: Diagnosis not present

## 2023-05-06 MED ORDER — ALLOPURINOL 100 MG PO TABS
100.0000 mg | ORAL_TABLET | Freq: Every day | ORAL | Status: DC
  Administered 2023-05-06 – 2023-05-07 (×2): 100 mg via ORAL
  Filled 2023-05-06 (×2): qty 1

## 2023-05-06 MED ORDER — AMLODIPINE BESYLATE 5 MG PO TABS
5.0000 mg | ORAL_TABLET | Freq: Every day | ORAL | Status: DC
  Administered 2023-05-06 – 2023-05-07 (×2): 5 mg via ORAL
  Filled 2023-05-06 (×2): qty 1

## 2023-05-06 MED ORDER — HYDRALAZINE HCL 50 MG PO TABS: 25.0000 mg | ORAL_TABLET | Freq: Three times a day (TID) | ORAL | Status: DC | PRN

## 2023-05-06 MED ORDER — ACETAMINOPHEN 325 MG PO TABS
650.0000 mg | ORAL_TABLET | Freq: Three times a day (TID) | ORAL | Status: DC | PRN
  Administered 2023-05-06: 650 mg via ORAL
  Filled 2023-05-06: qty 2

## 2023-05-06 MED ORDER — VITAMIN C 500 MG PO TABS
1000.0000 mg | ORAL_TABLET | Freq: Every day | ORAL | Status: DC
  Administered 2023-05-06 – 2023-05-07 (×2): 1000 mg via ORAL
  Filled 2023-05-06 (×2): qty 2

## 2023-05-06 MED ORDER — POTASSIUM CHLORIDE CRYS ER 20 MEQ PO TBCR
20.0000 meq | EXTENDED_RELEASE_TABLET | Freq: Two times a day (BID) | ORAL | Status: DC
  Administered 2023-05-06 – 2023-05-07 (×3): 20 meq via ORAL
  Filled 2023-05-06 (×3): qty 1

## 2023-05-06 MED ORDER — PANTOPRAZOLE SODIUM 40 MG PO TBEC
40.0000 mg | DELAYED_RELEASE_TABLET | Freq: Every day | ORAL | Status: DC
  Administered 2023-05-06 – 2023-05-07 (×2): 40 mg via ORAL
  Filled 2023-05-06 (×2): qty 1

## 2023-05-06 MED ORDER — FAMOTIDINE 20 MG PO TABS
20.0000 mg | ORAL_TABLET | Freq: Once | ORAL | Status: AC
  Administered 2023-05-06: 20 mg via ORAL
  Filled 2023-05-06: qty 1

## 2023-05-06 MED ORDER — ONDANSETRON 4 MG PO TBDP: 4.0000 mg | ORAL_TABLET | Freq: Three times a day (TID) | ORAL | Status: DC | PRN

## 2023-05-06 MED ORDER — ALBUTEROL SULFATE (2.5 MG/3ML) 0.083% IN NEBU
3.0000 mL | INHALATION_SOLUTION | RESPIRATORY_TRACT | Status: DC
  Administered 2023-05-06 – 2023-05-07 (×4): 3 mL via RESPIRATORY_TRACT
  Filled 2023-05-06 (×4): qty 3

## 2023-05-06 MED ORDER — DIAZEPAM 5 MG PO TABS
5.0000 mg | ORAL_TABLET | Freq: Once | ORAL | Status: AC
  Administered 2023-05-06: 5 mg via ORAL
  Filled 2023-05-06: qty 1

## 2023-05-06 MED ORDER — MAGNESIUM GLUCONATE 500 (27 MG) MG PO TABS
500.0000 mg | ORAL_TABLET | ORAL | Status: DC
Start: 2023-05-07 — End: 2023-05-07
  Administered 2023-05-07: 500 mg via ORAL
  Filled 2023-05-06: qty 1

## 2023-05-06 MED ORDER — LOSARTAN POTASSIUM 50 MG PO TABS
100.0000 mg | ORAL_TABLET | Freq: Every day | ORAL | Status: DC
  Administered 2023-05-06 – 2023-05-07 (×2): 100 mg via ORAL
  Filled 2023-05-06 (×2): qty 2

## 2023-05-06 MED ORDER — POLYMYXIN B-TRIMETHOPRIM 10000-0.1 UNIT/ML-% OP SOLN
1.0000 [drp] | OPHTHALMIC | Status: DC
  Administered 2023-05-06 – 2023-05-07 (×3): 1 [drp] via OPHTHALMIC
  Filled 2023-05-06: qty 10

## 2023-05-06 MED ORDER — LEVOTHYROXINE SODIUM 112 MCG PO TABS
112.0000 ug | ORAL_TABLET | Freq: Every day | ORAL | Status: DC
  Administered 2023-05-07: 112 ug via ORAL
  Filled 2023-05-06: qty 1

## 2023-05-06 MED ORDER — POLYVINYL ALCOHOL 1.4 % OP SOLN
1.0000 [drp] | Freq: Two times a day (BID) | OPHTHALMIC | Status: DC
  Administered 2023-05-06 – 2023-05-07 (×2): 1 [drp] via OPHTHALMIC
  Filled 2023-05-06 (×2): qty 15

## 2023-05-06 MED ORDER — CALCIUM CARBONATE 1250 (500 CA) MG PO TABS
1500.0000 mg | ORAL_TABLET | Freq: Every day | ORAL | Status: DC
  Administered 2023-05-06 – 2023-05-07 (×2): 1250 mg via ORAL
  Filled 2023-05-06 (×2): qty 1

## 2023-05-06 MED ORDER — PAROXETINE HCL 20 MG PO TABS
20.0000 mg | ORAL_TABLET | Freq: Every day | ORAL | Status: DC
  Administered 2023-05-06 – 2023-05-07 (×2): 20 mg via ORAL
  Filled 2023-05-06 (×2): qty 1

## 2023-05-06 MED ORDER — POTASSIUM CHLORIDE CRYS ER 20 MEQ PO TBCR: 20.0000 meq | EXTENDED_RELEASE_TABLET | Freq: Once | ORAL | Status: DC

## 2023-05-06 MED ORDER — VITAMIN D3 25 MCG (1000 UNIT) PO TABS
100.0000 ug | ORAL_TABLET | Freq: Every day | ORAL | Status: DC
  Administered 2023-05-06 – 2023-05-07 (×2): 100 ug via ORAL
  Filled 2023-05-06: qty 4
  Filled 2023-05-06: qty 1
  Filled 2023-05-06: qty 4

## 2023-05-06 MED ORDER — TRAZODONE HCL 50 MG PO TABS
25.0000 mg | ORAL_TABLET | Freq: Every day | ORAL | Status: DC
  Administered 2023-05-06: 25 mg via ORAL
  Filled 2023-05-06: qty 1

## 2023-05-06 MED ORDER — UMECLIDINIUM BROMIDE 62.5 MCG/ACT IN AEPB
1.0000 | INHALATION_SPRAY | Freq: Every day | RESPIRATORY_TRACT | Status: DC
  Filled 2023-05-06: qty 7

## 2023-05-06 NOTE — ED Provider Notes (Signed)
 Emergency Medicine Observation Re-evaluation Note  Physical Exam   BP (!) 185/94 (BP Location: Left Arm)   Pulse 61   Temp 97.6 F (36.4 C) (Oral)   Resp 18   Ht 5\' 3"  (1.6 m)   Wt 65 kg   SpO2 99%   BMI 25.38 kg/m   Patient appears in no acute distress.  ED Course / MDM   No reported events during my shift at the time of this note.   Pt is awaiting dispo from consultants   Buell Carmin MD    Buell Carmin, MD 05/06/23 [id number]

## 2023-05-06 NOTE — ED Provider Notes (Signed)
-----------------------------------------   1:45 PM on 05/06/2023 -----------------------------------------  The patient has had several episodes of leg cramps.  The episode several hours ago was improved with oral Valium .  The patient asked to speak to me.  She is very concerned about her potassium level and believes that it is low.  She is requesting IV potassium.  I explained that her potassium on the labs from yesterday was only borderline low and unlikely to be significantly contributing to her symptoms.  I advised that there is no indication for IV potassium, which carries risks, however I would be happy to order oral potassium repletion.  The patient agreed.  Of note, the patient has been confrontational with staff, arguing repeatedly with the nurses.  When I initially approached her to ask how I could help her, she asked "are you going to be respectful to me this time?" complaining about a prior ED visit that she was unsatisfied with, although then refused to provide any details about what she was upset about.  She attempted to argue with me about GI potassium absorption and why she needs the IV.     Lind Repine, MD 05/06/23 1349

## 2023-05-06 NOTE — ED Notes (Signed)
 Pt started screaming " I need to see my labs" This RN showed pt lab results on computer. Pt stated " I know my body, you dont know what you're talking about" when this RN went over lab results. Pt was given a Gatorade per request.

## 2023-05-06 NOTE — Evaluation (Signed)
 Physical Therapy Evaluation Patient Details Name: Angela [name] MRN: [id number] DOB: 07-Feb-1950 Today's Date: 05/06/2023  History of Present Illness  Pt is a 73 y.o. female presenting to hospital 05/05/23 with R eye pain and drainage (diagnosed with conjunctivitis at urgent care prior to arrival); pt also c/o HA, paresthesias in B UE's/LE's last 24 hours; noted to be hypertensive.  PMH includes endometriosis, IBS, COPD, PTSD, htn  Clinical Impression  Prior to recent medical concerns, pt reports being modified independent ambulating with rollator; currently does not have a home to discharge to.  Pt c/o significant cramping B LE's and pain in back beginning of session but pain improved during sessions activities (nurse updated on pt's status and pt's request for pain medication end of session).  Currently pt is modified independent with transfers and CGA with ambulation 30 feet x2 with rollator use (to/from bathroom).  No loss of balance noted during sessions activities.  Pt would currently benefit from skilled PT to address noted impairments and functional limitations (see below for any additional details).  Upon hospital discharge, pt would benefit from ongoing therapy.     If plan is discharge home, recommend the following: Help with stairs or ramp for entrance;Assist for transportation   Can travel by private vehicle        Equipment Recommendations Rollator (4 wheels) (pt has rollator already)  Recommendations for Other Services       Functional Status Assessment Patient has had a recent decline in their functional status and demonstrates the ability to make significant improvements in function in a reasonable and predictable amount of time.     Precautions / Restrictions Precautions Precautions: Fall Restrictions Weight Bearing Restrictions Per Provider Order: No      Mobility  Bed Mobility Overal bed mobility: Modified Independent             General bed mobility  comments: Semi-supine to sitting EOB    Transfers Overall transfer level: Modified independent Equipment used: Rollator (4 wheels)               General transfer comment: steady transfer from ED stretcher bed x2 trials    Ambulation/Gait Ambulation/Gait assistance: Contact guard assist Gait Distance (Feet):  (30 feet x2 (to/from bathroom)) Assistive device: Rollator (4 wheels)   Gait velocity: decreased     General Gait Details: initially decreased B LE step length (pt reporting B LE's cramping) but improved step through gait pattern noted with continued ambulation during session  Stairs            Wheelchair Mobility     Tilt Bed    Modified Rankin (Stroke Patients Only)       Balance Overall balance assessment: Needs assistance Sitting-balance support: No upper extremity supported, Feet supported Sitting balance-Leahy Scale: Good Sitting balance - Comments: steady reaching within BOS   Standing balance support: No upper extremity supported Standing balance-Leahy Scale: Good Standing balance comment: steady reaching within BOS                             Pertinent Vitals/Pain Pain Assessment Pain Assessment: Faces Faces Pain Scale: No hurt (6/10 beginning of session but appearing to be 0/10 (facial expression) end of session--pt verbalizing complaints of pain but did not specify how much when asked; nurse notified) Pain Location: B LE cramping; back pain Pain Descriptors / Indicators: Cramping, Discomfort, Grimacing, Guarding Pain Intervention(s): Limited activity within patient's tolerance, Monitored during session,  Repositioned, Patient requesting pain meds-RN notified    Home Living Family/patient expects to be discharged to:: Shelter/Homeless                   Additional Comments: Pt reports she was living with her friend but unable to return--is homeless now.    Prior Function Prior Level of Function : Independent/Modified  Independent             Mobility Comments: Modified independent ambulating with rollator; 2 falls in last week in bathroom       Extremity/Trunk Assessment   Upper Extremity Assessment Upper Extremity Assessment: Defer to OT evaluation;Overall [facility] for tasks assessed    Lower Extremity Assessment Lower Extremity Assessment: Generalized weakness       Communication   Communication Communication: No apparent difficulties    Cognition Arousal: Alert Behavior During Therapy: WFL for tasks assessed/performed   PT - Cognitive impairments: No apparent impairments                         Following commands: Intact       Cueing Cueing Techniques: Verbal cues     General Comments      Exercises     Assessment/Plan    PT Assessment Patient needs continued PT services  PT Problem List Decreased strength;Decreased activity tolerance;Decreased mobility;Pain       PT Treatment Interventions DME instruction;Gait training;Functional mobility training;Therapeutic activities;Therapeutic exercise;Balance training;Patient/family education    PT Goals (Current goals can be found in the Care Plan section)  Acute Rehab PT Goals Patient Stated Goal: to improve pain PT Goal Formulation: With patient Time For Goal Achievement: 05/20/23 Potential to Achieve Goals: Fair    Frequency Min 1X/week     Co-evaluation PT/OT/SLP Co-Evaluation/Treatment: Yes Reason for Co-Treatment: To address functional/ADL transfers PT goals addressed during session: Mobility/safety with mobility OT goals addressed during session: ADL's and self-care       AM-PAC PT "6 Clicks" Mobility  Outcome Measure Help needed turning from your back to your side while in a flat bed without using bedrails?: None Help needed moving from lying on your back to sitting on the side of a flat bed without using bedrails?: None Help needed moving to and from a bed to a chair (including a wheelchair)?:  None Help needed standing up from a chair using your arms (e.g., wheelchair or bedside chair)?: None Help needed to walk in hospital room?: A Little Help needed climbing 3-5 steps with a railing? : A Little 6 Click Score: 22    End of Session Equipment Utilized During Treatment: Gait belt Activity Tolerance: Patient tolerated treatment well Patient left: with call bell/phone within reach (sitting on edge of ED stretcher bed eating breakfast) Nurse Communication: Mobility status;Patient requests pain meds;Precautions PT Visit Diagnosis: Other abnormalities of gait and mobility (R26.89);Muscle weakness (generalized) (M62.81);History of falling (Z91.81);Pain Pain - Right/Left:  (B) Pain - part of body: Leg    Time: 1610-9604 PT Time Calculation (min) (ACUTE ONLY): 27 min   Charges:   PT Evaluation $PT Eval Low Complexity: 1 Low   PT General Charges $$ ACUTE PT VISIT: 1 Visit        Amador Junes, PT 05/06/23, 10:51 AM

## 2023-05-06 NOTE — TOC Transition Note (Signed)
 Transition of Care [facility]) - Discharge Note   Patient Details  Name: Angela [name] MRN: [id number] Date of Birth: 12/25/1950  Transition of Care [facility]) CM/SW Contact:  Zoe Hinds, RN Phone Number: 05/06/2023, 4:58 PM   Clinical Narrative:    This CM spoke with pt after chart review and PT/OT recommendations in great detail about a safe dc plan. Pt informed that she has been currently residing with a close friend named Flo at address listed on her demographic sheet in  a rm x 4 months. Pt further shared Friend (Flo) is leaving to go to [place]  to be with family and pt can not return to residence. Pt confirmed her insurance listed is active , DME used currently rollator/specail pillows,&orthotic  shoes.Pt confirmed she uses  Psychologist, forensic  in [place] on [address], pt denied having an active PCP or transportation services in place currently . Pt informed per PT/OT notes she currently does not meet criteria for a SNF to have rehab at discharge. This CM discussed additional dc option including ALF, group homes, Air BNB, etc.. Pt very adamant about where she would like to explore housing option .Pt expressed she is  willing to explore housing options in  these area's [place], [place], [place],& [place]. Pt informed she does not want to look at any housing options in the [place] area. Pt confirmed she receives SSI in the amount of $1200 monthly and that she has resided in previous ALF [address] and [facility] and does not want to return to those facilities . Pt expressed her concerns regarding having to turn over her whole SSI check to an ALF and not have it be to her liking . This CM explained in great detailed pt will cont to be followed by [facility]  who will work with her diligently to coordinate a safe dc plan .         Patient Goals and CMS Choice        Expected Discharge Plan and Services                                                 Prior Living Arrangements/Services                       Activities of Daily Living      Permission Sought/Granted                  Emotional Assessment              Admission diagnosis:  Hypertension and right pain Patient Active Problem List   Diagnosis Date Noted   Stress reaction causing mixed disturbance of emotion and conduct 09/10/2021   Homeless    Mixed hyperlipidemia 07/02/2019   Hypothyroidism due to acquired atrophy of thyroid  04/29/2019   Osteopenia determined by x-ray 04/29/2019   Acquired claw toe of both feet 10/23/2018   Aortic atherosclerosis (HCC) 05/31/2018   Achilles tendon disorder 04/25/2018   Diastolic dysfunction 03/29/2018   Mitral regurgitation 03/29/2018   Personality disorder (HCC) 12/25/2017   Arthritis of hand, degenerative 12/07/2017   Hallux valgus, acquired 07/05/2017   Hammer toes of both feet 07/05/2017   Unstable ankle 07/05/2017   Tinea unguium 05/08/2017   Conductive hearing loss of right ear with unrestricted hearing of left ear 04/03/2017  Adjustment disorder with depressed mood 01/18/2017   Migraine 01/18/2017   Irritable bowel syndrome with constipation 11/17/2016   Bilateral carpal tunnel syndrome 11/17/2016   Plantar fasciitis, bilateral 11/17/2016   Cervical myofascial pain syndrome 11/17/2016   Chronic hyponatremia 11/17/2016   PTSD (post-traumatic stress disorder) 11/17/2016   Hiatal hernia 11/17/2016   Vitamin D  deficiency 11/17/2016   Vitamin B12 deficiency 11/17/2016   Cervical spondylosis with myelopathy 05/24/2016   MCI (mild cognitive impairment) with memory loss 03/30/2016   Gait abnormality 03/30/2016   Chronic bipolar affective disorder (HCC) 01/24/2016   Anemia, iron deficiency 06/24/2015   Benign essential HTN 06/24/2015   History of falling 05/22/2015   History of TIA (transient ischemic attack) 04/20/2015   Gastroesophageal reflux disease with hiatal hernia 04/20/2015   Cervical  disc disorder at C5-C6 level with radiculopathy 03/25/2015   Chronic neck pain 03/25/2015   Pelvic pain in female 10/30/2014   History of skin cancer 06/30/2014   Chronic headaches 12/16/2013   Neuropathy 12/16/2013   PCP:  Pcp, No Pharmacy:   [facility] [address], [place] - [address] [place] [address] Phone: [phone] Fax: [phone]  [facility]) - [place], [place] - [address] [facility] [address] [place] [address] [place] [address] Phone: [phone] Fax: [phone]  [facility] - [place], [place] - [address] [place] [address] Phone: [phone] Fax: [phone]  [facility] [id number] [place], [place] - [address] [place] [address] Phone: [phone] Fax: [phone]     Social Determinants of Health (SDOH) Interventions    Readmission Risk Interventions     No data to display                   Patient Goals and CMS Choice            Discharge Placement                       Discharge Plan and Services Additional resources added to the After Visit Summary for                                       Social Drivers of Health (SDOH) Interventions SDOH Screenings   Food Insecurity: Food Insecurity Present (02/13/2023)   Received from [facility]  Housing: High Risk (02/13/2023)   Received from [facility]  Transportation Needs: Unmet Transportation Needs (02/13/2023)   [facility]  Utilities: At Risk (02/13/2023)   Received from [facility]  Alcohol  Screen: Low Risk  (06/19/2019)  Depression (PHQ2-9): Medium Risk (06/19/2019)  Financial Resource Strain: High Risk (02/13/2023)   Received from [facility]  Physical Activity: Inactive (04/03/2022)   Received from [facility]  Social Connections: Socially Isolated (04/03/2022)   Received from [facility]  Stress: Stress Concern Present (04/03/2022)   Received from [facility]  Tobacco Use: Medium Risk (05/05/2023)  Health Literacy: Low Risk  (07/11/2020)   Received from [facility]     Readmission Risk Interventions     No data to display

## 2023-05-06 NOTE — Progress Notes (Signed)
 Pt on phone with case management-will round back around in a little bit

## 2023-05-06 NOTE — ED Notes (Signed)
 This RN called pharmacy to tube the LIQUIFILM TEARS

## 2023-05-06 NOTE — Progress Notes (Signed)
 Visited with pt per follow up from consult-pt expressed needs around medication. Asked for prayer and pt joined in prayer. Offered compassionate presence and listening ear.

## 2023-05-06 NOTE — ED Notes (Signed)
 Pt given phone to speak to CM

## 2023-05-06 NOTE — Progress Notes (Signed)
 Occupational Therapy Evaluation Patient Details Name: Angela [name] MRN: [id number] DOB: 1950-10-17 Today's Date: 05/06/2023   History of Present Illness   Pt is a 73 y.o. female presenting to hospital 05/05/23 with R eye pain and drainage (diagnosed with conjunctivitis at urgent care prior to arrival); pt also c/o HA, paresthesias in B UE's/LE's last 24 hours; noted to be hypertensive.  PMH includes endometriosis, IBS, COPD, PTSD, htn     Clinical Impressions Pt was seen for OT/PT evaluation this date. Prior to hospital admission, pt was indep/modi in ADLs/IADLs but reports requiring more assistance for meal prep and some IADLs more recently. Pt reports that PTA she lived with her friend but this friend is now moving and the pt is now homeless. She endorses that she has no where to go. On arrival to pt bedside she was complaining of intense Bil LE cramping, she drank some Gatorade and rested while seated on EOB and symptoms seemed to resolve. Pt presents to acute OT demonstrating impaired ADL performance and functional mobility (balance/recent falls)  (See OT problem list for additional functional deficits). Pt currently requires MODI for all transfers and CGA with Amb. Pt amb ~41ft to BR and back to bedside with use of her rollator, no LOB noted during mobility. RN notified of Pt requesting meds at the end of session. Pt would benefit from skilled OT services to address noted impairments and functional limitations (see below for any additional details) in order to maximize safety and independence while minimizing falls risk. OT will follow acutely.     If plan is discharge home, recommend the following:   A little help with walking and/or transfers;A little help with bathing/dressing/bathroom     Functional Status Assessment   Patient has had a recent decline in their functional status and demonstrates the ability to make significant improvements in function in a reasonable and  predictable amount of time.     Equipment Recommendations   None recommended by OT     Recommendations for Other Services         Precautions/Restrictions   Precautions Precautions: Fall Restrictions Weight Bearing Restrictions Per Provider Order: No     Mobility Bed Mobility Overal bed mobility: Modified Independent             General bed mobility comments: Semi-supine to sitting EOB    Transfers Overall transfer level: Modified independent Equipment used: Rollator (4 wheels)                      Balance               Standing balance comment: steady reaching outside BOS during grooming tasks                           ADL either performed or assessed with clinical judgement   ADL Overall ADL's : Needs assistance/impaired Eating/Feeding: Modified independent   Grooming: Wash/dry face;Wash/dry hands;Standing;Supervision/safety                   Toilet Transfer: Social research officer, government (4 wheels);Ambulation   Toileting- Clothing Manipulation and Hygiene: Contact guard assist;Sit to/from stand       Functional mobility during ADLs: Contact guard assist;Supervision/safety;Rollator (4 wheels) General ADL Comments: CGA progressed to supervision during mobility/ADLs     Vision Baseline Vision/History: 1 Wears glasses       Perception         Praxis  Pertinent Vitals/Pain Pain Assessment Pain Assessment: Faces Faces Pain Scale: No hurt Pain Location: B LE cramping; back pain Pain Descriptors / Indicators: Cramping, Discomfort, Grimacing, Guarding Pain Intervention(s): Limited activity within patient's tolerance     Extremity/Trunk Assessment Upper Extremity Assessment Upper Extremity Assessment: Overall WFL for tasks assessed;Generalized weakness   Lower Extremity Assessment Lower Extremity Assessment: Defer to PT evaluation;Generalized weakness   Cervical / Trunk Assessment Cervical /  Trunk Assessment: Kyphotic   Communication Communication Communication: No apparent difficulties   Cognition Arousal: Alert Behavior During Therapy: WFL for tasks assessed/performed Cognition: No family/caregiver present to determine baseline             OT - Cognition Comments: A/Ox4                 Following commands: Intact       Cueing  General Comments   Cueing Techniques: Verbal cues  Very talkative, emotional about situation   Exercises Exercises: Other exercises Other Exercises Other Exercises: Edu: role of OT in acute care   Shoulder Instructions      Home Living Family/patient expects to be discharged to:: Shelter/Homeless                                 Additional Comments: Pt reports she was living with her friend but unable to return--is homeless now.      Prior Functioning/Environment Prior Level of Function : Independent/Modified Independent             Mobility Comments: Modified independent ambulating with rollator; 2 falls in last week in bathroom      OT Problem List: Decreased strength;Decreased activity tolerance;Decreased safety awareness;Decreased knowledge of use of DME or AE;Decreased knowledge of precautions   OT Treatment/Interventions: Therapeutic exercise;Energy conservation;DME and/or AE instruction;Therapeutic activities;Balance training      OT Goals(Current goals can be found in the care plan section)   Acute Rehab OT Goals Patient Stated Goal: Gain stability OT Goal Formulation: With patient Time For Goal Achievement: 05/20/23 Potential to Achieve Goals: Good   OT Frequency:  Min 1X/week    Co-evaluation   Reason for Co-Treatment: To address functional/ADL transfers PT goals addressed during session: Mobility/safety with mobility OT goals addressed during session: ADL's and self-care      AM-PAC OT "6 Clicks" Daily Activity     Outcome Measure Help from another person eating meals?:  None Help from another person taking care of personal grooming?: None Help from another person toileting, which includes using toliet, bedpan, or urinal?: A Little Help from another person bathing (including washing, rinsing, drying)?: A Little Help from another person to put on and taking off regular upper body clothing?: None Help from another person to put on and taking off regular lower body clothing?: None 6 Click Score: 22   End of Session Equipment Utilized During Treatment: Rollator (4 wheels);Gait belt Nurse Communication: Patient requests pain meds  Activity Tolerance: Patient tolerated treatment well Patient left: with call bell/phone within reach;with nursing/sitter in room (Seated on EOB eating breakfast)  OT Visit Diagnosis: Unsteadiness on feet (R26.81);Other abnormalities of gait and mobility (R26.89);Muscle weakness (generalized) (M62.81);Repeated falls (R29.6);History of falling (Z91.81)                Time: 1610-9604 OT Time Calculation (min): 27 min Charges:  OT General Charges $OT Visit: 1 Visit OT Evaluation $OT Eval Moderate Complexity: 1 Mod OT Treatments $Self Care/Home  Management : 8-22 mins  Rosaria Common M.S. OTR/L  05/06/23, 11:07 AM

## 2023-05-07 DIAGNOSIS — I10 Essential (primary) hypertension: Secondary | ICD-10-CM | POA: Diagnosis not present

## 2023-05-07 MED ORDER — VITAMIN C 250 MG PO TABS: 250.0000 mg | ORAL_TABLET | Freq: Every day | ORAL | 0 refills | Status: DC

## 2023-05-07 MED ORDER — ALLOPURINOL 100 MG PO TABS: 100.0000 mg | ORAL_TABLET | Freq: Every day | ORAL | 2 refills | Status: DC

## 2023-05-07 MED ORDER — LOSARTAN POTASSIUM 100 MG PO TABS: 100.0000 mg | ORAL_TABLET | Freq: Every day | ORAL | 2 refills | Status: DC

## 2023-05-07 MED ORDER — PANTOPRAZOLE SODIUM 40 MG PO TBEC: 40.0000 mg | DELAYED_RELEASE_TABLET | Freq: Every day | ORAL | 0 refills | Status: DC

## 2023-05-07 MED ORDER — TRAZODONE HCL 50 MG PO TABS: 25.0000 mg | ORAL_TABLET | Freq: Every day | ORAL | 2 refills | Status: DC

## 2023-05-07 MED ORDER — LEVOTHYROXINE SODIUM 112 MCG PO TABS
112.0000 ug | ORAL_TABLET | Freq: Every day | ORAL | 2 refills | Status: AC
Start: 2023-05-07 — End: 2023-08-05

## 2023-05-07 MED ORDER — PAROXETINE HCL 20 MG PO TABS: 20.0000 mg | ORAL_TABLET | Freq: Every day | ORAL | 2 refills | Status: DC

## 2023-05-07 MED ORDER — AMLODIPINE BESYLATE 5 MG PO TABS: 5.0000 mg | ORAL_TABLET | Freq: Every day | ORAL | 2 refills | Status: DC

## 2023-05-07 NOTE — ED Notes (Signed)
 Angela [name], Child psychotherapist, at pt bedside with Char[employer] fundraiser. SW attempted to explain services that they can offer to pt to help with housing. Pt refusing all services and excusing SW from bedside. Pt sts " I dont need your assistance." Pt attempting to manipulate SW and RN and sts " you guys are putting words in my mouth and that you are not wanting to help me nor listening."

## 2023-05-07 NOTE — ED Notes (Signed)
 Provider and RN at pt bedside. Provider attempted to talk to pt about her needs as she has requested to talk to him. Provider gave pt ample time to explain herself. Pt did not state any needs that she wanted us  to do for her. Provider was talking to pt and than pt sts " I dont like what you are saying, you are putting negativity and words in my mouth and I do not appreciate that."

## 2023-05-07 NOTE — ED Notes (Addendum)
 Pt able to walk to bathroom with a stable gait while using her walker. Pt able to wash her self with no assistance needed. Pt is very manipulative to staff.

## 2023-05-07 NOTE — ED Notes (Addendum)
 Pt sts that she has to now change clothes prior to her leaving. RN also took pt VS as requested. RN to call security to facilitate pt being D/C'd. Also, pt just finished taking her morning medication that RN documented in the AM. That included her BP meds.

## 2023-05-07 NOTE — ED Provider Notes (Signed)
-----------------------------------------   1:31 PM on 05/07/2023 -----------------------------------------   Blood pressure (!) 151/79, pulse 63, temperature 98 F (36.7 C), temperature source Oral, resp. rate 19, height 5\' 3"  (1.6 m), weight 65 kg, SpO2 100%.  Called to bedside to discuss with patient ongoing care.  She has been seen by Child psychotherapist on several attempts and completely refusing all services.  Social worker attempted to help with housing and patient declined all this stating "I do not need her assistance".  She has not wanted to accept help from any nurses and states that she just "cannot leave."  I discussed with her at the bedside what we could help her with.  She then started saying "I am on the phone with my senator".  She continually would not respond to my direct question of what we could be assistance with in the ED.  She stated repeatedly that we had no resources to offer her and she did not want any additional assistance from the social worker here.  At this time, I do not see any additional benefit we have from keeping her in the ED and I am concerned that she might be possibly malingering here at this time.  She has no life-threatening pathology on her exam or laboratory workup.  I do think she is safe for discharge otherwise without any life-threatening pathology and refusing us  to provide any additional resources to her or other help at this time.  I once again asked her what I could help her with today and she continued to refuse to answer this.   Kandee Orion, MD 05/07/23 [phone]

## 2023-05-07 NOTE — TOC Progression Note (Signed)
 Transition of Care [facility]) - Progression Note    Patient Details  Name: Angela [name] MRN: [id number] Date of Birth: April 28, 1950  [facility] CM/SW Contact  Elmira Haddock, LCSW Phone Number: 05/07/2023, 1:26 PM  Clinical Narrative:   CSW followed up with patient per weekend [facility] colleague.  CSW introduced self and reason for visit.  Patient was rude and unwilling to answer any questions about her plan to discharge.  CSW attempted to explain that, medically, there's not a reason for her to stay at the hospital.  Patient states that the person she was living with isn't going to allow her to come back to stay there.  Patient states that she wasn't paying any rent, but that she has no money to pay for anywhere else to go.  Per chart review, patient receives $1200/month.  CSW asked RN Ace Holder to be a part of the conversation as CSW offered patient shelter resources.  Patient declined. CSW will discuss discharge with doctor.         Expected Discharge Plan and Services                                               Social Determinants of Health (SDOH) Interventions SDOH Screenings   Food Insecurity: Food Insecurity Present (02/13/2023)   Received from [facility]  Housing: High Risk (02/13/2023)   Received from [facility]  Transportation Needs: Unmet Transportation Needs (02/13/2023)   Received from [facility]  Utilities: At Risk (02/13/2023)   Received from [facility]  Alcohol  Screen: Low Risk  (06/19/2019)  Depression (PHQ2-9): Medium Risk (06/19/2019)  Financial Resource Strain: High Risk (02/13/2023)   Received from [facility]  Physical Activity: Inactive (04/03/2022)   Received from [facility]  Social Connections: Socially Isolated (04/03/2022)   Received from [facility]  Stress: Stress Concern Present (04/03/2022)   Received  from [facility]  Tobacco Use: Medium Risk (05/05/2023)  Health Literacy: Low Risk  (07/11/2020)   Received from [facility]    Readmission Risk Interventions     No data to display

## 2023-05-07 NOTE — ED Notes (Signed)
 Pt continues to malinger while packing her belongings. Pt asked RN to ask provider about prescribing home meds, MD did.

## 2023-05-08 ENCOUNTER — Emergency Department
Admission: EM | Admit: 2023-05-08 | Discharge: 2023-05-08 | Disposition: A | Discharge status: Home / Self Care | Attending: Emergency Medicine | Admitting: Emergency Medicine

## 2023-05-08 ENCOUNTER — Other Ambulatory Visit: Payer: Self-pay

## 2023-05-08 DIAGNOSIS — Z79899 Other long term (current) drug therapy: Secondary | ICD-10-CM | POA: Diagnosis not present

## 2023-05-08 DIAGNOSIS — Z5900 Homelessness unspecified: Secondary | ICD-10-CM | POA: Insufficient documentation

## 2023-05-08 DIAGNOSIS — I10 Essential (primary) hypertension: Secondary | ICD-10-CM | POA: Insufficient documentation

## 2023-05-08 DIAGNOSIS — J449 Chronic obstructive pulmonary disease, unspecified: Secondary | ICD-10-CM | POA: Insufficient documentation

## 2023-05-08 DIAGNOSIS — H1013 Acute atopic conjunctivitis, bilateral: Secondary | ICD-10-CM | POA: Diagnosis not present

## 2023-05-08 DIAGNOSIS — H5789 Other specified disorders of eye and adnexa: Secondary | ICD-10-CM | POA: Diagnosis present

## 2023-05-08 DIAGNOSIS — E039 Hypothyroidism, unspecified: Secondary | ICD-10-CM | POA: Diagnosis not present

## 2023-05-08 MED ORDER — POTASSIUM CHLORIDE CRYS ER 20 MEQ PO TBCR
20.0000 meq | EXTENDED_RELEASE_TABLET | Freq: Once | ORAL | Status: AC
  Administered 2023-05-08: 20 meq via ORAL
  Filled 2023-05-08: qty 1

## 2023-05-08 MED ORDER — DIPHENHYDRAMINE HCL 25 MG PO CAPS
50.0000 mg | ORAL_CAPSULE | Freq: Once | ORAL | Status: AC
  Administered 2023-05-08: 50 mg via ORAL
  Filled 2023-05-08: qty 2

## 2023-05-08 MED ORDER — OLOPATADINE HCL 0.1 % OP SOLN: 1.0000 [drp] | Freq: Two times a day (BID) | OPHTHALMIC | 0 refills | Status: AC

## 2023-05-08 MED ORDER — DEXAMETHASONE 6 MG PO TABS
10.0000 mg | ORAL_TABLET | Freq: Once | ORAL | Status: AC
  Administered 2023-05-08: 10 mg via ORAL
  Filled 2023-05-08: qty 1

## 2023-05-08 MED ORDER — HYDRALAZINE HCL 50 MG PO TABS
25.0000 mg | ORAL_TABLET | Freq: Once | ORAL | Status: AC
  Administered 2023-05-08: 25 mg via ORAL
  Filled 2023-05-08: qty 1

## 2023-05-08 MED ORDER — ACETAMINOPHEN 500 MG PO TABS
1000.0000 mg | ORAL_TABLET | Freq: Once | ORAL | Status: AC
  Administered 2023-05-08: 1000 mg via ORAL
  Filled 2023-05-08: qty 2

## 2023-05-08 NOTE — ED Notes (Signed)
 Fall interventions attempted, Pt refusing interventions after this RN explained she is considered high risk based on our scale.

## 2023-05-08 NOTE — ED Provider Notes (Signed)
 [facility] Provider Note    Event Date/Time   First MD Initiated Contact with Patient 05/08/23 [phone]     (approximate)   History   Allergic Reaction   HPI  Angela [name] is a 73 y.o. female with history of COPD, hypothyroidism, labile mood, homelessness who is well-known to our emergency department who presents with multiple complaints.  States for several days she has had red, itchy eyes with drainage.  She was seen at internal medicine clinic yesterday and was discharged with prescription for prednisolone drops.  She has not been able to pick them up yet.  She is asking if we have steroid drops on formulary that we can give her here.  She states that the only thing that is helping her eyes is using cool compresses.  States she has an appointment to see [facility] at 1 PM today. No injury to her eye or vision loss.  She states that she is worried that she has having an allergic reaction.  She is also worried that this could be an interaction between her allopurinol  and Paxil .  She is requesting a "pharmacy consult".    Patient also stating that she is having cramps in her legs.  She feels that her potassium is low and feels that she needs potassium here.  Potassium level was 3.4 on 05/05/2023.  She states she is also worried because her blood pressure is elevated and reports that she has been compliant with her medication.  She is not having any acute symptoms with her hypertension.  She states that she was recently kicked out of the place that she was living and has nowhere to stay.  She is requesting to talk to the patient advocate "Melissa" to help her with housing.  She states that she does not want to see social work or case management because she feels like they have been "aggressive" with her.  It has been documented in her chart multiple times that patient is verbally aggressive with staff members and repeatedly refuses resources offered to her.   Patient states that she does not want "paper resources anymore".  History provided by patient.    Past Medical History:  Diagnosis Date   Agitation 09/18/2017   Allergic rhinitis    Altered mental status, unspecified 12/25/2018   Anemia    Blurred vision    Chest pain of uncertain etiology 05/31/2018   Confusion state 12/25/2018   COPD (chronic obstructive pulmonary disease) (HCC)    Depression    Diverticulosis    Endometriosis    Falls    GERD (gastroesophageal reflux disease)    Hernia, hiatal    Hypothyroidism    IBS (irritable bowel syndrome)    Labile mood    Malignant neoplasm of skin    Malnutrition of mild degree (HCC) 08/08/2018   Migraine    Neuropathy    Noncompliance 12/25/2017   PTSD (post-traumatic stress disorder)    SIADH (syndrome of inappropriate ADH production) (HCC) 09/08/2016   Thyroid  disease     Past Surgical History:  Procedure Laterality Date   APPENDECTOMY     CHOLECYSTECTOMY     CHOLECYSTECTOMY, LAPAROSCOPIC     ESOPHAGOGASTRODUODENOSCOPY (EGD) WITH PROPOFOL  N/A 03/29/2015   Procedure: ESOPHAGOGASTRODUODENOSCOPY (EGD) WITH PROPOFOL ;  Surgeon: Luella Sager, MD;  Location: [facility] ENDOSCOPY;  Service: Endoscopy;  Laterality: N/A;   HERNIA REPAIR     laproscopy     TONSILLECTOMY     uteral suspension  MEDICATIONS:  Prior to Admission medications   Medication Sig Start Date End Date Taking? Authorizing Provider  acetaminophen  (TYLENOL ) 650 MG CR tablet Take 650 mg by mouth every 8 (eight) hours as needed for pain.    [provider]  Acetylcysteine (NAC) 500 MG CAPS Take 500 mg by mouth 2 (two) times a day.    [provider]  albuterol  (VENTOLIN  HFA) 108 (90 Base) MCG/ACT inhaler albuterol  sulfate HFA 90 mcg/actuation aerosol inhaler  INHALE 2 PUFFS BY MOUTH EVERY 4 HOURS AS NEEDED    [provider]  allopurinol  (ZYLOPRIM ) 100 MG tablet Take 1 tablet (100 mg total) by mouth daily. 04/25/23   Buell Carmin, MD   allopurinol  (ZYLOPRIM ) 100 MG tablet Take 1 tablet (100 mg total) by mouth daily. 05/07/23 08/05/23  Kandee Orion, MD  amLODipine  (NORVASC ) 5 MG tablet Take 1 tablet (5 mg total) by mouth daily. 05/07/23 05/06/24  Kandee Orion, MD  ascorbic acid  (VITAMIN C ) 1000 MG tablet Take 2,000 mg by mouth daily.    [provider]  calcium  carbonate (OSCAL) 1500 (600 Ca) MG TABS tablet Calcium  600 mg calcium  (1,500 mg) tablet  Take 1 tablet 3 times a day by oral route.    [provider]  Calcium  Citrate (CITRACAL PO) Take 800 mg by mouth. Takes 2 (400mg ) tablets BID    [provider]  Cholecalciferol  100 MCG (4000 UT) CAPS Take 3 capsules by mouth.    [provider]  colchicine  0.6 MG tablet Take 2 tablets ( 1.2 mg ) then in 1 hour take 1 tablet ( 0.6 mg),  then take 1 tablet ( 0.6 mg ) daily for 6 days Patient not taking: Reported on 05/07/2023 01/15/23   [name], NP  Cyanocobalamin  (VITAMIN B-12) 1000 MCG SUBL Place 1,000 mcg under the tongue.    [provider]  diclofenac  Sodium (VOLTAREN ) 1 % GEL Apply 4 g topically 4 (four) times daily. 01/31/23   McDonald, Olive Better, DPM  dicyclomine  (BENTYL ) 20 MG tablet Take 20 mg by mouth every 6 (six) hours.    [provider]  doxycycline  (VIBRA -TABS) 100 MG tablet Take 100 mg by mouth 2 (two) times daily. 02/13/23   [provider]  EPINEPHrine  0.3 mg/0.3 mL IJ SOAJ injection Inject into the skin. 03/01/22   [provider]  estradiol  (ESTRACE ) 0.1 MG/GM vaginal cream Place 1 Applicatorful vaginally daily.    [provider]  famotidine  (PEPCID ) 20 MG tablet Take 1 tablet (20 mg total) by mouth 2 (two) times daily as needed for heartburn or indigestion. 11/26/18   Thersia Flax, MD  famotidine  (PEPCID ) 40 MG tablet TAKE 1 TABLET AT BEDTIME ORALLY ONCE A DAY 90 DAYS Patient not taking: Reported on 05/06/2023    [provider]  ferrous sulfate  325 (65 FE) MG tablet  Take 1 tablet (325 mg total) by mouth daily with breakfast. Patient not taking: Reported on 05/06/2023 04/29/19   Sheron Dixons, MD  hydrALAZINE  (APRESOLINE ) 25 MG tablet Take 1 tablet (25 mg total) by mouth 3 (three) times daily as needed (for pressure >150). 04/25/23 07/24/23  Buell Carmin, MD  HYDROcodone -acetaminophen  (NORCO/VICODIN) 5-325 MG tablet Take 1 tablet by mouth every 6 (six) hours as needed. 04/19/20   [provider]  hydrOXYzine  (ATARAX /VISTARIL ) 10 MG tablet TAKE 1 TABLET BY MOUTH EVERY 8 HOURS AS NEEDED Patient taking differently: Take 25 mg by mouth every 4 (four) hours as needed. 06/04/19  Sheron Dixons, MD  Icosapent  Ethyl (VASCEPA ) 1 g CAPS TAKE 2 CAPSULES BY MOUTH TWICE DAILY (TO HELP WITH TRIGLYCERIDES) Patient not taking: Reported on 05/06/2023 11/26/18   Thersia Flax, MD  ivermectin (STROMECTOL) 3 MG TABS tablet TAKE FIVE TABLETS BY MOUTH ON AN EMPTY STOMACH. REPEAT IN ONE WEEK. WASH BEDDING, TOWELS, WORN CLOTHES IN HOT WATER AFTER EACH DOSE 07/08/19   [name], MD  ketoconazole (NIZORAL) 2 % cream  02/04/19   [provider]  Lactulose  20 GM/30ML SOLN 30 ml every 4 hours until constipation is relieved Patient not taking: Reported on 05/06/2023 04/29/19   Sheron Dixons, MD  levalbuterol  (XOPENEX ) 1.25 MG/3ML nebulizer solution Take 3 mLs by nebulization every 6 (six) hours as needed for wheezing.    [provider]  levothyroxine  (SYNTHROID ) 112 MCG tablet Take 1 tablet (112 mcg total) by mouth daily before breakfast. 04/25/23 07/24/23  Buell Carmin, MD  levothyroxine  (SYNTHROID ) 112 MCG tablet Take 1 tablet (112 mcg total) by mouth daily. 05/07/23 08/05/23  Kandee Orion, MD  Lidocaine  4 % PTCH     [provider]  linaclotide  (LINZESS ) 290 MCG CAPS capsule Take 1 capsule (290 mcg total) by mouth daily before breakfast. Patient not taking: Reported on 03/06/2023 12/26/17   Clapacs, Elida Grounds, MD  LORazepam  (ATIVAN ) 1 MG tablet Take 1 mg by  mouth every 4 (four) hours as needed for sleep. 02/06/18   [provider]  losartan  (COZAAR ) 100 MG tablet Take 1 tablet (100 mg total) by mouth daily. 05/07/23 05/06/24  Kandee Orion, MD  Magnesium  500 MG TABS Take 500 mg by mouth every other day.    [provider]  mupirocin ointment (BACTROBAN) 2 % Apply 1 application  topically 3 (three) times daily. 02/04/19   [provider]  neomycin-bacitracin -polymyxin (NEOSPORIN) ointment Apply topically. Patient not taking: Reported on 03/06/2023 03/11/20   [provider]  nystatin  (MYCOSTATIN /NYSTOP ) powder Apply 1 Application topically 3 (three) times daily. Patient not taking: Reported on 05/06/2023 01/15/23   [name], NP  ondansetron  (ZOFRAN -ODT) 4 MG disintegrating tablet Take 1 tablet (4 mg total) by mouth every 8 (eight) hours as needed for nausea or vomiting. 04/25/23   Buell Carmin, MD  pantoprazole  (PROTONIX ) 40 MG tablet Take 1 tablet (40 mg total) by mouth daily. 04/25/23 04/24/24  Buell Carmin, MD  pantoprazole  (PROTONIX ) 40 MG tablet Take 1 tablet (40 mg total) by mouth daily. 05/07/23 08/05/23  Kandee Orion, MD  PARoxetine  (PAXIL ) 20 MG tablet Take 1 tablet (20 mg total) by mouth daily. 04/25/23   Buell Carmin, MD  PARoxetine  (PAXIL ) 20 MG tablet Take 1 tablet (20 mg total) by mouth daily. 05/07/23 08/05/23  Kandee Orion, MD  polyethylene glycol-electrolytes (NULYTELY ) 420 g solution     [provider]  prednisoLONE acetate (PRED FORTE) 1 % ophthalmic suspension  07/26/18   [provider]  promethazine  (PHENERGAN ) 12.5 MG tablet Take 12.5 mg by mouth every 6 (six) hours as needed for nausea or vomiting. Patient not taking: Reported on 03/06/2023    [provider]  pyridoxine  (B-6) 100 MG tablet Take 1 tablet (100 mg total) by mouth 2 (two) times daily. Patient taking differently: Take 500 mg by mouth in the morning and at bedtime. 12/26/17   Clapacs, Elida Grounds, MD  REFRESH 1.4-0.6  % SOLN Apply to eye. Patient not taking: Reported on 03/06/2023 03/17/20   [provider]  Senna-Natural Laxatives (  SENOKOT LAXATIVE) Tea Bag MISC     [provider]  sucralfate  (CARAFATE ) 1 GM/10ML suspension Take 10 mLs (1 g total) by mouth 4 (four) times daily. 04/25/23 04/24/24  Buell Carmin, MD  terconazole  (TERAZOL 7 ) 0.4 % vaginal cream     [provider]  Tiotropium Bromide  Monohydrate 2.5 MCG/ACT AERS Inhale into the lungs. 04/22/22 02/19/24  [provider]  tiZANidine  (ZANAFLEX ) 2 MG tablet Take by mouth every 6 (six) hours as needed for muscle spasms. Patient not taking: Reported on 03/06/2023    [provider]  traZODone  (DESYREL ) 50 MG tablet Take 0.5 tablets (25 mg total) by mouth at bedtime. 04/25/23   Buell Carmin, MD  traZODone  (DESYREL ) 50 MG tablet Take 0.5 tablets (25 mg total) by mouth at bedtime. 05/07/23 08/05/23  Kandee Orion, MD  triamcinolone  ointment (KENALOG ) 0.5 % Apply 1 application topically 2 (two) times daily. Patient not taking: Reported on 03/06/2023 11/26/18   Thersia Flax, MD  trimethoprim -polymyxin b  (POLYTRIM ) ophthalmic solution Place 1 drop into both eyes 4 (four) times daily for 7 days. Patient not taking: Reported on 05/06/2023 05/05/23 05/12/23  Wellington Half, NP  umeclidinium bromide  (INCRUSE ELLIPTA ) 62.5 MCG/INH AEPB Inhale 1 puff into the lungs daily. Patient not taking: Reported on 03/06/2023 11/26/18   Thersia Flax, MD  valsartan (DIOVAN) 320 MG tablet Take 320 mg by mouth daily. Patient not taking: Reported on 05/06/2023    [provider]  vitamin C  (ASCORBIC ACID ) 250 MG tablet Take 1 tablet (250 mg total) by mouth daily. 05/07/23   Kandee Orion, MD  Vitamin D , Ergocalciferol , (DRISDOL ) 1.25 MG (50000 UNIT) CAPS capsule Take 1 capsule (50,000 Units total) by mouth every 7 (seven) days. Patient taking differently: Take 5,000 Units by mouth every 7 (seven) days. 05/19/19   Sheron Dixons, MD  XIIDRA 5  % SOLN Place 1 drop into both eyes 2 (two) times daily. 07/27/18   [provider]    Physical Exam   Triage Vital Signs: ED Triage Vitals  Encounter Vitals Group     BP 05/08/23 0102 (!) 178/95     Systolic BP Percentile --      Diastolic BP Percentile --      Pulse Rate 05/08/23 0101 66     Resp 05/08/23 0101 16     Temp 05/08/23 0101 98 F (36.7 C)     Temp src --      SpO2 05/08/23 0101 99 %     Weight 05/08/23 0101 139 lb (63 kg)     Height 05/08/23 0101 5\' 2"  (1.575 m)     Head Circumference --      Peak Flow --      Pain Score 05/08/23 0101 0     Pain Loc --      Pain Education --      Exclude from Growth Chart --     Most recent vital signs: Vitals:   05/08/23 0530 05/08/23 0600  BP: (!) 147/82 (!) 140/78  Pulse: (!) 58 (!) 55  Resp:    Temp:    SpO2: 100% 97%    CONSTITUTIONAL: Alert, responds appropriately to questions.  Elderly.  In no distress. HEAD: Normocephalic, atraumatic EYES: Mild injection of the conjunctiva bilaterally with minimal soft tissue swelling of her eyelids.  Extraocular movements appear intact.  No hyphema or hypopyon.  No chemosis. ENT: normal nose; moist mucous membranes, normal speech, no trismus or drooling,  handling her secretions without difficulty, normal phonation NECK: Supple, normal ROM CARD: RRR; S1 and S2 appreciated RESP: Normal chest excursion without splinting or tachypnea; breath sounds clear and equal bilaterally; no wheezes, no rhonchi, no rales, no hypoxia or respiratory distress, speaking full sentences ABD/GI: Non-distended; soft, non-tender, no rebound, no guarding, no peritoneal signs BACK: The back appears normal EXT: Normal ROM in all joints; no deformity noted, no edema SKIN: Normal color for age and race; warm; no rash on exposed skin, no urticaria NEURO: Moves all extremities equally, normal speech    ED Results / Procedures / Treatments   LABS: (all labs ordered are listed, but only abnormal  results are displayed) Labs Reviewed - No data to display   EKG:  EKG Interpretation Date/Time:    Ventricular Rate:    PR Interval:    QRS Duration:    QT Interval:    QTC Calculation:   R Axis:      Text Interpretation:           RADIOLOGY: My personal review and interpretation of imaging:    I have personally reviewed all radiology reports.   No results found.   PROCEDURES:  Critical Care performed: No      Procedures    IMPRESSION / MDM / ASSESSMENT AND PLAN / ED COURSE  I reviewed the triage vital signs and the nursing notes.    Patient here with multiple complaints.  History of homelessness, malingering.  Multiple recent ED visits.    DIFFERENTIAL DIAGNOSIS (includes but not limited to):   Homelessness, malingering, chronic psychiatric illness/labile mood, borderline personality disorder, asymptomatic hypertension, allergic conjunctivitis, doubt glaucoma, iritis, endophthalmitis   Patient's presentation is most consistent with acute, uncomplicated illness.   PLAN: Patient was recently seen for conjunctivitis and provided with prescriptions that were sent to her pharmacy but she was not able to pick them up.  She is asking that I start her on steroid eyedrops from the ED.  I do not think this is clinically indicated.  She states she has an appointment to see an ophthalmologist today at 1 PM.  She does have some very minimal swelling of her eyelids.  Will give her a dose of Decadron  here for symptomatic relief.  She has already received Benadryl .  Will start her on antihistamine eyedrops.  This does not look like a bacterial or viral conjunctivitis today.  She is also complaining of muscle cramps but appears quite comfortable in the bed.  She states she thinks her potassium level is low.  Recently it was checked and was on the low side of normal.  She was given a one-time dose of potassium.  Will repeat this here but I do not feel that she needs to have  repeat lab work today.  She states that she is also wanting a "pharmacy consult" to go over her medications to ensure there is no interactions that could be causing her symptoms today.  When this was explained to her that this would not be done by the inpatient pharmacist but that she could talk to her outpatient pharmacist regarding this concern patient becomes increasingly upset and told the charge nurse at the bedside that she was "scared of her".  Patient exhibiting behavior consistent with borderline personality disorder, splitting.  This appears to be well-documented in her chart.  Explained to patient that we are simply explaining to her that this would not be something that would be done in the emergency department or by  the inpatient pharmacist but would be done by her outpatient pharmacist who fills all of her medications.  When asked about her housing situation, patient states that she has nowhere to go.  Have offered her shelter resources which she declines.  I have offered to let her talk to social work and case management again in the morning but she states that she does not want to talk to them because they are "aggressive" with her.  She states she wants to talk to the patient advocate Melissa.  Discussed with patient that she is welcome to discuss with the patient advocate at any time but that Lovett Ruck is not a case Production designer, theatre/television/film or Child psychotherapist and would have nothing to do with getting her help with housing.  She states that she does not want to go into a nursing facility.  I suspect that there is a large component of malingering present today.  Discussed with her that I will give her a dose of Decadron  here, Tylenol , 1 dose of her hydralazine  and potassium for symptomatic relief but at this time I do not feel there is any emergent life-threatening condition present and that she will be discharged.  Patient states that she just wants somewhere to rest for a little while because "I have been through  a lot" and "I have gout " and recently "had RSV".   MEDICATIONS GIVEN IN ED: Medications  diphenhydrAMINE  (BENADRYL ) capsule 50 mg (50 mg Oral Given 05/08/23 0104)  hydrALAZINE  (APRESOLINE ) tablet 25 mg (25 mg Oral Given 05/08/23 0508)  dexamethasone  (DECADRON ) tablet 10 mg (10 mg Oral Given 05/08/23 0508)  potassium chloride  SA (KLOR-CON  M) CR tablet 20 mEq (20 mEq Oral Given 05/08/23 0508)  acetaminophen  (TYLENOL ) tablet 1,000 mg (1,000 mg Oral Given 05/08/23 0508)     ED COURSE: Blood pressure at discharge is 140/78.  At this time, I do not feel there is any life-threatening condition present. I reviewed all nursing notes, vitals, pertinent previous records.  All lab and urine results, EKGs, imaging ordered have been independently reviewed and interpreted by myself.  I reviewed all available radiology reports from any imaging ordered this visit.  Based on my assessment, I feel the patient is safe to be discharged home without further emergent workup and can continue workup as an outpatient as needed. Discussed all findings, treatment plan as well as usual and customary return precautions.  They verbalize understanding and are comfortable with this plan.  Outpatient follow-up has been provided as needed.  All questions have been answered.    CONSULTS:  none   OUTSIDE RECORDS REVIEWED: Reviewed internal medicine note from yesterday.  Prescription for steroid eyedrops was sent to her pharmacy on 05/07/2023.       FINAL CLINICAL IMPRESSION(S) / ED DIAGNOSES   Final diagnoses:  Allergic conjunctivitis of both eyes  Asymptomatic hypertension  Homelessness     Rx / DC Orders   ED Discharge Orders          Ordered    olopatadine  (PATADAY ) 0.1 % ophthalmic solution  2 times daily        05/08/23 0526             Note:  This document was prepared using Dragon voice recognition software and may include unintentional dictation errors.   Moana Munford, Clover Dao, DO 05/08/23 [phone]

## 2023-05-08 NOTE — ED Triage Notes (Signed)
 Pt states she was seen at [facility] earlier and dx with conjunctivitis, pt has not yet picked up rx, pt states she went there for what she thought was an allergic reaction that she feels like she is still having. Pt speaking in complete sentences, no distress noted.

## 2023-05-12 ENCOUNTER — Encounter (HOSPITAL_BASED_OUTPATIENT_CLINIC_OR_DEPARTMENT_OTHER): Payer: Self-pay | Admitting: Emergency Medicine

## 2023-05-12 ENCOUNTER — Other Ambulatory Visit: Payer: Self-pay

## 2023-05-12 ENCOUNTER — Emergency Department (HOSPITAL_BASED_OUTPATIENT_CLINIC_OR_DEPARTMENT_OTHER)
Admission: EM | Admit: 2023-05-12 | Discharge: 2023-05-12 | Disposition: A | Discharge status: Home / Self Care | Attending: Emergency Medicine | Admitting: Emergency Medicine

## 2023-05-12 DIAGNOSIS — R7309 Other abnormal glucose: Secondary | ICD-10-CM | POA: Insufficient documentation

## 2023-05-12 DIAGNOSIS — Z91010 Allergy to peanuts: Secondary | ICD-10-CM | POA: Diagnosis not present

## 2023-05-12 DIAGNOSIS — R109 Unspecified abdominal pain: Secondary | ICD-10-CM | POA: Diagnosis present

## 2023-05-12 DIAGNOSIS — J449 Chronic obstructive pulmonary disease, unspecified: Secondary | ICD-10-CM | POA: Insufficient documentation

## 2023-05-12 DIAGNOSIS — G8929 Other chronic pain: Secondary | ICD-10-CM | POA: Diagnosis not present

## 2023-05-12 DIAGNOSIS — Z79899 Other long term (current) drug therapy: Secondary | ICD-10-CM | POA: Insufficient documentation

## 2023-05-12 DIAGNOSIS — R110 Nausea: Secondary | ICD-10-CM | POA: Insufficient documentation

## 2023-05-12 DIAGNOSIS — Z91040 Latex allergy status: Secondary | ICD-10-CM | POA: Insufficient documentation

## 2023-05-12 DIAGNOSIS — Z5900 Homelessness unspecified: Secondary | ICD-10-CM | POA: Diagnosis not present

## 2023-05-12 DIAGNOSIS — R739 Hyperglycemia, unspecified: Secondary | ICD-10-CM

## 2023-05-12 LAB — CBC WITH DIFFERENTIAL/PLATELET
Abs Immature Granulocytes: 0.01 10*3/uL (ref 0.00–0.07)
Basophils Absolute: 0.1 10*3/uL (ref 0.0–0.1)
Basophils Relative: 1 %
Eosinophils Absolute: 0.3 10*3/uL (ref 0.0–0.5)
Eosinophils Relative: 4 %
HCT: 39.1 % (ref 36.0–46.0)
Hemoglobin: 13.5 g/dL (ref 12.0–15.0)
Immature Granulocytes: 0 %
Lymphocytes Relative: 22 %
Lymphs Abs: 1.4 10*3/uL (ref 0.7–4.0)
MCH: 30.4 pg (ref 26.0–34.0)
MCHC: 34.5 g/dL (ref 30.0–36.0)
MCV: 88.1 fL (ref 80.0–100.0)
Monocytes Absolute: 0.7 10*3/uL (ref 0.1–1.0)
Monocytes Relative: 10 %
Neutro Abs: 4.1 10*3/uL (ref 1.7–7.7)
Neutrophils Relative %: 63 %
Platelets: 333 10*3/uL (ref 150–400)
RBC: 4.44 MIL/uL (ref 3.87–5.11)
RDW: 13.9 % (ref 11.5–15.5)
WBC: 6.5 10*3/uL (ref 4.0–10.5)
nRBC: 0 % (ref 0.0–0.2)

## 2023-05-12 LAB — URINALYSIS, ROUTINE W REFLEX MICROSCOPIC
Bilirubin Urine: NEGATIVE
Glucose, UA: NEGATIVE mg/dL
Hgb urine dipstick: NEGATIVE
Ketones, ur: NEGATIVE mg/dL
Nitrite: NEGATIVE
Protein, ur: NEGATIVE mg/dL
Specific Gravity, Urine: 1.02 (ref 1.005–1.030)
pH: 7.5 (ref 5.0–8.0)

## 2023-05-12 LAB — URINE DRUG SCREEN
Amphetamines: NOT DETECTED
Barbiturates: NOT DETECTED
Benzodiazepines: NOT DETECTED
Cocaine: NOT DETECTED
Fentanyl: NOT DETECTED
Methadone Scn, Ur: NOT DETECTED
Opiates: NOT DETECTED
Tetrahydrocannabinol: NOT DETECTED

## 2023-05-12 LAB — TROPONIN T, HIGH SENSITIVITY: Troponin T High Sensitivity: <15 ng/L (ref <19)

## 2023-05-12 LAB — LIPASE, BLOOD: Lipase: 44 U/L (ref 11–51)

## 2023-05-12 LAB — COMPREHENSIVE METABOLIC PANEL WITH GFR
ALT: 20 U/L (ref 0–44)
AST: 27 U/L (ref 15–41)
Albumin: 4.7 g/dL (ref 3.5–5.0)
Alkaline Phosphatase: 111 U/L (ref 38–126)
Anion gap: 16 — ABNORMAL HIGH (ref 5–15)
BUN: 16 mg/dL (ref 8–23)
CO2: 24 mmol/L (ref 22–32)
Calcium: 10 mg/dL (ref 8.9–10.3)
Chloride: 99 mmol/L (ref 98–111)
Creatinine, Ser: 0.79 mg/dL (ref 0.44–1.00)
GFR, Estimated: >60 mL/min (ref >60)
Glucose, Bld: 108 mg/dL — ABNORMAL HIGH (ref 70–99)
Potassium: 3.6 mmol/L (ref 3.5–5.1)
Sodium: 139 mmol/L (ref 135–145)
Total Bilirubin: <0.2 mg/dL (ref 0.0–1.2)
Total Protein: 7.8 g/dL (ref 6.5–8.1)

## 2023-05-12 LAB — URINALYSIS, MICROSCOPIC (REFLEX)

## 2023-05-12 LAB — ETHANOL: Alcohol, Ethyl (B): <15 mg/dL (ref <15)

## 2023-05-12 MED ORDER — LIDOCAINE VISCOUS HCL 2 % MT SOLN
15.0000 mL | Freq: Once | OROMUCOSAL | Status: AC
  Administered 2023-05-12: 15 mL via OROMUCOSAL
  Filled 2023-05-12: qty 15

## 2023-05-12 MED ORDER — POTASSIUM CHLORIDE CRYS ER 20 MEQ PO TBCR
40.0000 meq | EXTENDED_RELEASE_TABLET | Freq: Once | ORAL | Status: DC
  Filled 2023-05-12: qty 2

## 2023-05-12 MED ORDER — ACETAMINOPHEN 325 MG PO TABS: 650.0000 mg | ORAL_TABLET | Freq: Once | ORAL | Status: DC

## 2023-05-12 MED ORDER — ALUM & MAG HYDROXIDE-SIMETH 200-200-20 MG/5ML PO SUSP
30.0000 mL | Freq: Once | ORAL | Status: AC
  Administered 2023-05-12: 30 mL via ORAL
  Filled 2023-05-12: qty 30

## 2023-05-12 MED ORDER — PANTOPRAZOLE SODIUM 40 MG IV SOLR
40.0000 mg | Freq: Once | INTRAVENOUS | Status: AC
  Administered 2023-05-12: 40 mg via INTRAVENOUS
  Filled 2023-05-12: qty 10

## 2023-05-12 MED ORDER — ONDANSETRON HCL 4 MG/2ML IJ SOLN
4.0000 mg | Freq: Once | INTRAMUSCULAR | Status: AC
  Administered 2023-05-12: 4 mg via INTRAVENOUS
  Filled 2023-05-12: qty 2

## 2023-05-12 NOTE — Care Management (Addendum)
 Transition of Care [facility]) - Emergency Department Mini Assessment   Patient Details  Name: Angela [name] MRN: [id number] Date of Birth: January 03, 1951  Transition of Care [facility]) CM/SW Contact:    Ronni Colace, RN Phone Number: 05/12/2023, 8:23 AM   Clinical Narrative: Patient presented with abdominal pain. She has been cleared for discharge but has no where to go. She states to staff that she was scammed  at [facility], per chart review patient has recently )4/21)  refused social work interventions. Homeless resources are on the AVS. She has many personal items with her in the ED. She is ambulatory.   Spoke with nursing will message with MD Attempted to call patient cannot leave VM 0930 Patient requested to talk with TOC. Spoke with patient, she was tearful, and stated she was scared. She  cannot go back and stay with her friend, she does have medicaid and medicare, she has no money though for accommodations and her account is currently closed due to the situation with [facility] [employer].She normally walks with a walker. 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 Her husband was in the [employer]. He passed away, she has no family "they disowned me"  She states she is on the wait list for [facility]. Called [facility], they do not have any room today. They will notify the patient when they do. [facility], their , family shelter is not open to single participants.   Called [facility] , they only take males at this time. Conferred with social work. Unfortunately we have limited resources, the patient is not in need of SNF.  1015 Spoke with patient again via phone, no easy answer, no shelters available at this time.  She thanked this RNCM , Talking about talking to social services, her family, asking if there is a Orthoptist. Talking about not having any money. Told her to go to the [facility] on Monday to talk to a Child psychotherapist.  ED Mini Assessment: What brought  you to the Emergency Department? : abdominal pain  Barriers to Discharge: Unsafe home situation  Barrier interventions: homeless resources     Interventions which prevented an admission or readmission: Homeless Screening    Patient Contact and Communications        ,                 Admission diagnosis:  Mental Eval Patient Active Problem List   Diagnosis Date Noted   Stress reaction causing mixed disturbance of emotion and conduct 09/10/2021   Homeless    Mixed hyperlipidemia 07/02/2019   Hypothyroidism due to acquired atrophy of thyroid  04/29/2019   Osteopenia determined by x-ray 04/29/2019   Acquired claw toe of both feet 10/23/2018   Aortic atherosclerosis (HCC) 05/31/2018   Achilles tendon disorder 04/25/2018   Diastolic dysfunction 03/29/2018   Mitral regurgitation 03/29/2018   Personality disorder (HCC) 12/25/2017   Arthritis of hand, degenerative 12/07/2017   Hallux valgus, acquired 07/05/2017   Hammer toes of both feet 07/05/2017   Unstable ankle 07/05/2017   Tinea unguium 05/08/2017   Conductive hearing loss of right ear with unrestricted hearing of left ear 04/03/2017   Adjustment disorder with depressed mood 01/18/2017   Migraine 01/18/2017   Irritable bowel syndrome with constipation 11/17/2016   Bilateral carpal tunnel syndrome 11/17/2016   Plantar fasciitis, bilateral 11/17/2016   Cervical myofascial pain syndrome 11/17/2016   Chronic hyponatremia 11/17/2016   PTSD (post-traumatic stress  disorder) 11/17/2016   Hiatal hernia 11/17/2016   Vitamin D  deficiency 11/17/2016   Vitamin B12 deficiency 11/17/2016   Cervical spondylosis with myelopathy 05/24/2016   MCI (mild cognitive impairment) with memory loss 03/30/2016   Gait abnormality 03/30/2016   Chronic bipolar affective disorder (HCC) 01/24/2016   Anemia, iron deficiency 06/24/2015   Benign essential HTN 06/24/2015   History of falling 05/22/2015   History of TIA (transient ischemic attack)  04/20/2015   Gastroesophageal reflux disease with hiatal hernia 04/20/2015   Cervical disc disorder at C5-C6 level with radiculopathy 03/25/2015   Chronic neck pain 03/25/2015   Pelvic pain in female 10/30/2014   History of skin cancer 06/30/2014   Chronic headaches 12/16/2013   Neuropathy 12/16/2013   PCP:  Pcp, No Pharmacy:   [facility] [address], [place] - [address] [place] [address] Phone: [phone] Fax: [phone]  [facility]) - [place], [place] - [address] [facility] [address] [place] [address] [place] [address] Phone: [phone] Fax: [phone]  [facility] - [place], [place] - [address] [place] [address] Phone: [phone] Fax: [phone]  [facility] [id number] [place], [place] - [address] [place] [address] Phone: [phone] Fax: [phone]

## 2023-05-12 NOTE — ED Notes (Signed)
 Patient is up for discharge. Pt sts she has no where to go. Pt sts she is homeless. She arrived via ambulance followed by two police cars, which brought all her personal belongings ( several bags , suitcases etc). CN, assigned nurse has spoken with patient in regards to calling an emergency contact / relative  to assist however she has no one to contact at this time. Transition of care was ordered by EDP. Resources have been exhausted at this time. CN consulted with Dept. Director  who is in process of contacting Transition of care team to assist in proper discharge/ placement of this patient.

## 2023-05-12 NOTE — ED Triage Notes (Signed)
 Per pt, Hx of hernia and ulcers, was seen at [facility] regional prior, state Is "going through a lot", is homeless and mad a reservation but was scammed. States needs fluid and potassium and is having cramps.

## 2023-05-12 NOTE — ED Notes (Signed)
 Informed that a cab voucher would be given if she had a address . Ceclia Cohens RN spoke at length to her regarding this .

## 2023-05-12 NOTE — ED Notes (Signed)
 Patient refuses pain medication (Tylenol ). Patient sts "This will not help my pain". She discussed wanting something stronger. EDP is aware. No new orders for pain management given at this time. Patient also refuses Potassium.

## 2023-05-12 NOTE — ED Notes (Signed)
 Patient requested to put on hospital gown. With assisted by the nurse. Patient refuses.

## 2023-05-12 NOTE — ED Notes (Signed)
 Patient refused temperature at this time. Patient request to check later.
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
## 2023-05-12 NOTE — ED Notes (Signed)
 Discharge instructions reviewed with patient. Follow up information provided. No acute distress noted at time of departure. Belongings escorted out with patient by security, Psychologist, forensic and EMT.

## 2023-05-12 NOTE — ED Notes (Signed)
 Spoke with [name] C from Transitions of Care at 8:19. Was informed they are working on the Pt's case now.

## 2023-05-12 NOTE — ED Triage Notes (Signed)
 Per EMS pt reports abdominal pain and nausea. Pt was picked up at [facility], pt reservation was cancelled. Pt arrived by EMS with 2 [employer] cars of belongings.

## 2023-05-12 NOTE — ED Notes (Signed)
 EDP will evaluate pt for orders.

## 2023-05-12 NOTE — ED Provider Notes (Signed)
 [place] [facility] Provider Note   CSN: [id number] Arrival date & time: 05/12/23  0236     History  Chief Complaint  Patient presents with   Abdominal Pain    Angela [name] is a 73 y.o. female.  The history is provided by the patient.  Abdominal Pain She has history of COPD, SIADH, PTSD and states that she comes here because she is homeless and she was scammed when she tried to reserve a place to stay at a hotel.  She relates a history of hiatal hernia and is complaining of some epigastric discomfort as well as nausea.   Home Medications Prior to Admission medications   Medication Sig Start Date End Date Taking? Authorizing Provider  acetaminophen  (TYLENOL ) 650 MG CR tablet Take 650 mg by mouth every 8 (eight) hours as needed for pain.    [provider]  Acetylcysteine (NAC) 500 MG CAPS Take 500 mg by mouth 2 (two) times a day.    [provider]  albuterol  (VENTOLIN  HFA) 108 (90 Base) MCG/ACT inhaler albuterol  sulfate HFA 90 mcg/actuation aerosol inhaler  INHALE 2 PUFFS BY MOUTH EVERY 4 HOURS AS NEEDED    [provider]  allopurinol  (ZYLOPRIM ) 100 MG tablet Take 1 tablet (100 mg total) by mouth daily. 04/25/23   Buell Carmin, MD  allopurinol  (ZYLOPRIM ) 100 MG tablet Take 1 tablet (100 mg total) by mouth daily. 05/07/23 08/05/23  Kandee Orion, MD  amLODipine  (NORVASC ) 5 MG tablet Take 1 tablet (5 mg total) by mouth daily. 05/07/23 05/06/24  Kandee Orion, MD  ascorbic acid  (VITAMIN C ) 1000 MG tablet Take 2,000 mg by mouth daily.    [provider]  calcium  carbonate (OSCAL) 1500 (600 Ca) MG TABS tablet Calcium  600 mg calcium  (1,500 mg) tablet  Take 1 tablet 3 times a day by oral route.    [provider]  Calcium  Citrate (CITRACAL PO) Take 800 mg by mouth. Takes 2 (400mg ) tablets BID    [provider]  Cholecalciferol  100 MCG (4000 UT) CAPS Take 3 capsules by mouth.    [provider]  colchicine  0.6 MG tablet Take 2 tablets ( 1.2 mg ) then in 1 hour take 1 tablet ( 0.6 mg),  then take 1 tablet ( 0.6 mg ) daily for 6 days Patient not taking: Reported on 05/07/2023 01/15/23   [name], NP  Cyanocobalamin  (VITAMIN B-12) 1000 MCG SUBL Place 1,000 mcg under the tongue.    [provider]  diclofenac  Sodium (VOLTAREN ) 1 % GEL Apply 4 g topically 4 (four) times daily. 01/31/23   McDonald, Olive Better, DPM  dicyclomine  (BENTYL ) 20 MG tablet Take 20 mg by mouth every 6 (six) hours.    [provider]  doxycycline  (VIBRA -TABS) 100 MG tablet Take 100 mg by mouth 2 (two) times daily. 02/13/23   [provider]  EPINEPHrine  0.3 mg/0.3 mL IJ SOAJ injection Inject into the skin. 03/01/22   [provider]  estradiol  (ESTRACE ) 0.1 MG/GM vaginal cream Place 1 Applicatorful vaginally daily.    [provider]  famotidine  (PEPCID ) 20 MG tablet Take 1 tablet (20 mg total) by mouth 2 (two) times daily as needed for heartburn or indigestion. 11/26/18   Thersia Flax, MD  famotidine  (PEPCID ) 40 MG tablet TAKE 1 TABLET AT BEDTIME ORALLY ONCE A DAY 90 DAYS Patient not taking: Reported on 05/06/2023    [provider]  ferrous sulfate   325 (65 FE) MG tablet Take 1 tablet (325 mg total) by mouth daily with breakfast. Patient not taking: Reported on 05/06/2023 04/29/19   Sheron Dixons, MD  hydrALAZINE  (APRESOLINE ) 25 MG tablet Take 1 tablet (25 mg total) by mouth 3 (three) times daily as needed (for pressure >150). 04/25/23 07/24/23  Buell Carmin, MD  HYDROcodone -acetaminophen  (NORCO/VICODIN) 5-325 MG tablet Take 1 tablet by mouth every 6 (six) hours as needed. 04/19/20   [provider]  hydrOXYzine  (ATARAX /VISTARIL ) 10 MG tablet TAKE 1 TABLET BY MOUTH EVERY 8 HOURS AS NEEDED Patient taking differently: Take 25 mg by mouth every 4 (four) hours as needed. 06/04/19   Sheron Dixons, MD  Icosapent  Ethyl (VASCEPA ) 1 g CAPS TAKE 2  CAPSULES BY MOUTH TWICE DAILY (TO HELP WITH TRIGLYCERIDES) Patient not taking: Reported on 05/06/2023 11/26/18   Thersia Flax, MD  ivermectin (STROMECTOL) 3 MG TABS tablet TAKE FIVE TABLETS BY MOUTH ON AN EMPTY STOMACH. REPEAT IN ONE WEEK. WASH BEDDING, TOWELS, WORN CLOTHES IN HOT WATER AFTER EACH DOSE 07/08/19   [name], MD  ketoconazole (NIZORAL) 2 % cream  02/04/19   [provider]  Lactulose  20 GM/30ML SOLN 30 ml every 4 hours until constipation is relieved Patient not taking: Reported on 05/06/2023 04/29/19   Sheron Dixons, MD  levalbuterol  (XOPENEX ) 1.25 MG/3ML nebulizer solution Take 3 mLs by nebulization every 6 (six) hours as needed for wheezing.    [provider]  levothyroxine  (SYNTHROID ) 112 MCG tablet Take 1 tablet (112 mcg total) by mouth daily before breakfast. 04/25/23 07/24/23  Buell Carmin, MD  levothyroxine  (SYNTHROID ) 112 MCG tablet Take 1 tablet (112 mcg total) by mouth daily. 05/07/23 08/05/23  Kandee Orion, MD  Lidocaine  4 % PTCH     [provider]  linaclotide  (LINZESS ) 290 MCG CAPS capsule Take 1 capsule (290 mcg total) by mouth daily before breakfast. Patient not taking: Reported on 03/06/2023 12/26/17   Clapacs, Elida Grounds, MD  LORazepam  (ATIVAN ) 1 MG tablet Take 1 mg by mouth every 4 (four) hours as needed for sleep. 02/06/18   [provider]  losartan  (COZAAR ) 100 MG tablet Take 1 tablet (100 mg total) by mouth daily. 05/07/23 05/06/24  Kandee Orion, MD  Magnesium  500 MG TABS Take 500 mg by mouth every other day.    [provider]  mupirocin ointment (BACTROBAN) 2 % Apply 1 application  topically 3 (three) times daily. 02/04/19   [provider]  neomycin-bacitracin -polymyxin (NEOSPORIN) ointment Apply topically. Patient not taking: Reported on 03/06/2023 03/11/20   [provider]  nystatin  (MYCOSTATIN /NYSTOP ) powder Apply 1 Application topically 3 (three) times daily. Patient not taking: Reported on  05/06/2023 01/15/23   [name], NP  olopatadine  (PATADAY ) 0.1 % ophthalmic solution Place 1 drop into both eyes 2 (two) times daily for 7 days. 05/08/23 05/15/23  Ward, Clover Dao, DO  ondansetron  (ZOFRAN -ODT) 4 MG disintegrating tablet Take 1 tablet (4 mg total) by mouth every 8 (eight) hours as needed for nausea or vomiting. 04/25/23   Buell Carmin, MD  pantoprazole  (PROTONIX ) 40 MG tablet Take 1 tablet (40 mg total) by mouth daily. 04/25/23 04/24/24  Buell Carmin, MD  pantoprazole  (PROTONIX ) 40 MG tablet Take 1 tablet (40 mg total) by mouth daily. 05/07/23 08/05/23  Kandee Orion, MD  PARoxetine  (PAXIL ) 20 MG tablet Take 1 tablet (20 mg total) by mouth daily. 04/25/23   Buell Carmin, MD  PARoxetine  (PAXIL ) 20 MG tablet  Take 1 tablet (20 mg total) by mouth daily. 05/07/23 08/05/23  Kandee Orion, MD  polyethylene glycol-electrolytes (NULYTELY ) 420 g solution     [provider]  prednisoLONE acetate (PRED FORTE) 1 % ophthalmic suspension  07/26/18   [provider]  promethazine  (PHENERGAN ) 12.5 MG tablet Take 12.5 mg by mouth every 6 (six) hours as needed for nausea or vomiting. Patient not taking: Reported on 03/06/2023    [provider]  pyridoxine  (B-6) 100 MG tablet Take 1 tablet (100 mg total) by mouth 2 (two) times daily. Patient taking differently: Take 500 mg by mouth in the morning and at bedtime. 12/26/17   Clapacs, Elida Grounds, MD  REFRESH 1.4-0.6 % SOLN Apply to eye. Patient not taking: Reported on 03/06/2023 03/17/20   [provider]  Senna-Natural Laxatives (SENOKOT LAXATIVE) Tea Bag MISC     [provider]  sucralfate  (CARAFATE ) 1 GM/10ML suspension Take 10 mLs (1 g total) by mouth 4 (four) times daily. 04/25/23 04/24/24  Buell Carmin, MD  terconazole  (TERAZOL 7 ) 0.4 % vaginal cream     [provider]  Tiotropium Bromide  Monohydrate 2.5 MCG/ACT AERS Inhale into the lungs. 04/22/22 02/19/24  [provider]  tiZANidine  (ZANAFLEX ) 2 MG tablet  Take by mouth every 6 (six) hours as needed for muscle spasms. Patient not taking: Reported on 03/06/2023    [provider]  traZODone  (DESYREL ) 50 MG tablet Take 0.5 tablets (25 mg total) by mouth at bedtime. 04/25/23   Buell Carmin, MD  traZODone  (DESYREL ) 50 MG tablet Take 0.5 tablets (25 mg total) by mouth at bedtime. 05/07/23 08/05/23  Kandee Orion, MD  triamcinolone  ointment (KENALOG ) 0.5 % Apply 1 application topically 2 (two) times daily. Patient not taking: Reported on 03/06/2023 11/26/18   Thersia Flax, MD  trimethoprim -polymyxin b  (POLYTRIM ) ophthalmic solution Place 1 drop into both eyes 4 (four) times daily for 7 days. Patient not taking: Reported on 05/06/2023 05/05/23 05/12/23  Wellington Half, NP  umeclidinium bromide  (INCRUSE ELLIPTA ) 62.5 MCG/INH AEPB Inhale 1 puff into the lungs daily. Patient not taking: Reported on 03/06/2023 11/26/18   Thersia Flax, MD  valsartan (DIOVAN) 320 MG tablet Take 320 mg by mouth daily. Patient not taking: Reported on 05/06/2023    [provider]  vitamin C  (ASCORBIC ACID ) 250 MG tablet Take 1 tablet (250 mg total) by mouth daily. 05/07/23   Kandee Orion, MD  Vitamin D , Ergocalciferol , (DRISDOL ) 1.25 MG (50000 UNIT) CAPS capsule Take 1 capsule (50,000 Units total) by mouth every 7 (seven) days. Patient taking differently: Take 5,000 Units by mouth every 7 (seven) days. 05/19/19   Sheron Dixons, MD  XIIDRA 5 % SOLN Place 1 drop into both eyes 2 (two) times daily. 07/27/18   [provider]      Allergies    Amoxicillin -pot clavulanate, Aspirin, Bee venom, Cephalexin , Ciprofloxacin , Fluticasone-salmeterol, Haloperidol , Molds & smuts, Neomycin-polymyxin-dexameth, Other, Oxycodone-acetaminophen , Peanut butter flavoring agent (non-screening), Peanuts [peanut oil], Penicillin g, Penicillins, Propoxyphene, Sulfa antibiotics, Sulfacetamide sodium, Tramadol, Amikacin, Bee pollen, Imipramine, Prednisone, Sulfur, Azithromycin ,  Colchicine , Elemental sulfur, Fluticasone, Hymenoptera venom preparations, Lactose intolerance (gi), Oxycodone, Peanut-containing drug products, Phthalylsulfacetamide, Pregabalin, Risperidone, Sulfacetamide, Wound dressing adhesive, Adhesive [tape], Albuterol , and Hydroxyzine     Review of Systems   Review of Systems  Gastrointestinal:  Positive for abdominal pain.  All other systems reviewed and are negative.   Physical Exam Updated Vital Signs BP (!) 154/84   Pulse 61  Resp 15   Ht 5\' 2"  (1.575 m)   Wt 63 kg   SpO2 96%   BMI 25.40 kg/m  Physical Exam Vitals and nursing note reviewed.   73 year old female, resting comfortably and in no acute distress. Vital signs are significant for elevated blood pressure. Oxygen  saturation is 99%, which is normal. Head is normocephalic and atraumatic. PERRLA, EOMI. Oropharynx is clear. Neck is nontender and supple without adenopathy Lungs are clear without rales, wheezes, or rhonchi. Chest is nontender. Heart has regular rate and rhythm without murmur. Abdomen is soft, flat, with mild epigastric tenderness.  There is no rebound or guarding. Extremities have no cyanosis or edema, full range of motion is present. Skin is warm and dry without rash. Neurologic: Awake and alert, cranial nerves are intact, cranial nerves are intact, moves all extremities equally. Psychiatric: Rambling speech but does not appear to be responding to internal stimuli.  ED Results / Procedures / Treatments   Labs (all labs ordered are listed, but only abnormal results are displayed) Labs Reviewed  COMPREHENSIVE METABOLIC PANEL WITH GFR - Abnormal; Notable for the following components:      Result Value   Glucose, Bld 108 (*)    Anion gap 16 (*)    All other components within normal limits  URINALYSIS, ROUTINE W REFLEX MICROSCOPIC - Abnormal; Notable for the following components:   Leukocytes,Ua TRACE (*)    All other components within normal limits  URINALYSIS,  MICROSCOPIC (REFLEX) - Abnormal; Notable for the following components:   Bacteria, UA RARE (*)    All other components within normal limits  LIPASE, BLOOD  CBC WITH DIFFERENTIAL/PLATELET  URINE DRUG SCREEN  ETHANOL  TROPONIN T, HIGH SENSITIVITY  TROPONIN T, HIGH SENSITIVITY    EKG EKG Interpretation Date/Time:  Saturday May 12 2023 03:11:12 EDT Ventricular Rate:  80 PR Interval:  184 QRS Duration:  118 QT Interval:  416 QTC Calculation: 480 R Axis:   40  Text Interpretation: Sinus rhythm Left atrial enlargement Nonspecific intraventricular conduction delay When compared with ECG of 04/27/2023, No significant change was found Confirmed by Alissa April ([id number]) on 05/12/2023 3:34:10 AM  Procedures Procedures    Medications Ordered in ED Medications  potassium chloride  SA (KLOR-CON  M) CR tablet 40 mEq (has no administration in time range)  acetaminophen  (TYLENOL ) tablet 650 mg (has no administration in time range)  ondansetron  (ZOFRAN ) injection 4 mg (4 mg Intravenous Given 05/12/23 0436)  pantoprazole  (PROTONIX ) injection 40 mg (40 mg Intravenous Given 05/12/23 0436)  alum & mag hydroxide-simeth (MAALOX/MYLANTA) 200-200-20 MG/5ML suspension 30 mL (30 mLs Oral Given 05/12/23 0453)    ED Course/ Medical Decision Making/ A&P                                 Medical Decision Making Amount and/or Complexity of Data Reviewed Labs: ordered.  Risk OTC drugs. Prescription drug management.   Epigastric discomfort likely secondary to GERD or ulcer.  Doubt serious pathology such as pancreatitis, diverticulitis, cholecystitis.  Doubt ACS.  I reviewed her electrocardiogram, and my interpretation is nonspecific intraventricular conduction delay and unchanged from prior.  I have ordered screening laboratory workup including comprehensive metabolic panel, lipase, ethanol level, troponin.  I have reviewed her past records, and note 5 ED visits in the last 17 days with 90 of presentations.  I  suspect that her main problem is her living situation.  On further review  of her recent ED visits, she has been refusing social work intervention.  I have reviewed her laboratory test, my interpretation is mildly elevated random glucose level, otherwise normal comprehensive metabolic panel, normal lipase, normal CBC, undetectable ethanol, undetectable troponin, normal urinalysis, negative drug screen.  Although potassium is normal, it was in the lower end of normal and patient states that she has had problems with hypokalemia in the past.  I have ordered a dose of oral potassium.  At this point, I explained the patient that her evaluation was benign and I plan to discharge her.  She does endorse difficulty eating and about 10 pound weight loss.  She has pantoprazole  prescribed for her.  I am referring her to gastroenterology for further outpatient workup.  I have given her resource guide for homeless shelters.  I have requested transition of care consult to help with housing assistance.  Final Clinical Impression(s) / ED Diagnoses Final diagnoses:  Chronic abdominal pain  Homeless  Elevated random blood glucose level    Rx / DC Orders ED Discharge Orders          Ordered    Consult to Transition of Care Team       Provider:  (Not yet assigned)   05/12/23 0545              Alissa April, MD 05/12/23 [phone]

## 2023-05-12 NOTE — Discharge Instructions (Addendum)
 I have asked the social service team to contact you to see if they can help you with housing assistance.  In the meantime, please make use of the homeless shelters that are mentioned in the resource guide attached to your discharge papers.       RESOURCE GUIDE  Chronic Pain Problems: Contact [facility]  [phone] Patients need to be referred by their primary care doctor.  Insufficient Money for Medicine: Contact [employer]:  call [phone]  No Primary Care Doctor: Call [facility]  [phone] - can help you locate a primary care doctor that  accepts your insurance, provides certain services, etc. Physician Referral Service- [phone]  Agencies that provide inexpensive medical care: [facility]  [phone] [facility]  [phone] [facility]  [phone] [facility]  [phone] [facility]  [phone] [facility]  [phone]  Medicaid-accepting [facility] Providers: [facility]- [address]  [phone], Mon-Fri 9am-7pm, Sat 9am-1pm [facility]- [address] [place], [address] [place]  [phone] [facility]- [address], [address] [place]  [phone] [facility]- [address]  [phone] [facility]- [address] [place], [address], [phone]  Only accepts [place] patients after they have their name  applied to their card  Self Pay (no insurance) in [facility]: Sickle Cell Patients - [facility]  [address] [place], [phone] [facility]- [address] [place]  [phone]       [facility]- [address]       -     [facility]- see information above (Speak to [employer] if you do not have insurance)       -  [facility]- [address],  [phone]       -  [facility]- [address]  [phone]       -  Dr Sherlene Diss-  [address], [place], [phone]       -  [facility] - [address], [phone]       -  [facility]- [address], [phone], also [address] [phone]       -     [facility]- [address] [place], [phone], 1st & 3rd Saturday         every month, 10am-1pm  -     [facility]   [address] [place], [place].   Phone:  [phone], Fax:  [phone]. Hours of Operation:  9 am - 6 pm, M-F.  -     [facility]   [address], [place]   Phone: [phone], Fax: [phone]. Hours of Operation:  8:30 am - 5:30 pm, M-F.    Dental Assistance If unable to pay or uninsured, contact:  [facility]. to become qualified for the adult dental clinic.  Patients with Medicaid: [facility] [phone] [address], [phone] [address], [phone]  If unable to pay, or uninsured, contact [facility] [phone] in [place], [phone] in [facility]) to become qualified for the adult dental clinic  [facility] [address] [place], [place] [address] [phone]  [email or web address]  Other Proofreader Services: [facility]- [address] [place], [place], [place], [address], [phone], Ext. [phone], 2nd and 4th Thursday of the month at 6:30am.  10 clients each day by appointment, can sometimes see walk-in patients if someone does not show for an appointment. [facility]- [address] [place] [address] [place], [place], [address], [phone] [facility] [address] [place], [place], [address], [phone] [facility]- [phone] [facility]- [phone] [facility] [phone]

## 2023-05-12 NOTE — ED Notes (Addendum)
 Patient refused for assigned RN to start IV. Pt sts "If you are not the Best at starting IVs in the building get someone else." She then requested for another person to insert IV.

## 2023-05-24 ENCOUNTER — Ambulatory Visit: Admitting: Podiatry

## 2023-05-24 DIAGNOSIS — Z91199 Patient's noncompliance with other medical treatment and regimen due to unspecified reason: Secondary | ICD-10-CM

## 2023-05-24 NOTE — Progress Notes (Signed)
 1. No-show for appointment   No show #1.

## 2023-06-13 ENCOUNTER — Encounter: Payer: Self-pay | Admitting: *Deleted

## 2023-06-13 ENCOUNTER — Emergency Department
Admission: EM | Admit: 2023-06-13 | Discharge: 2023-06-13 | Disposition: A | Discharge status: Home / Self Care | Attending: Emergency Medicine | Admitting: Emergency Medicine

## 2023-06-13 ENCOUNTER — Other Ambulatory Visit: Payer: Self-pay

## 2023-06-13 DIAGNOSIS — Z5900 Homelessness unspecified: Secondary | ICD-10-CM | POA: Diagnosis not present

## 2023-06-13 DIAGNOSIS — R1013 Epigastric pain: Secondary | ICD-10-CM | POA: Insufficient documentation

## 2023-06-13 DIAGNOSIS — J069 Acute upper respiratory infection, unspecified: Secondary | ICD-10-CM | POA: Diagnosis not present

## 2023-06-13 DIAGNOSIS — R109 Unspecified abdominal pain: Secondary | ICD-10-CM | POA: Diagnosis present

## 2023-06-13 DIAGNOSIS — R059 Cough, unspecified: Secondary | ICD-10-CM | POA: Diagnosis present

## 2023-06-13 MED ORDER — BENZONATATE 100 MG PO CAPS
100.0000 mg | ORAL_CAPSULE | Freq: Once | ORAL | Status: AC
  Administered 2023-06-13: 100 mg via ORAL
  Filled 2023-06-13: qty 1

## 2023-06-13 MED ORDER — BENZONATATE 100 MG PO CAPS: 100.0000 mg | ORAL_CAPSULE | Freq: Three times a day (TID) | ORAL | 0 refills | Status: DC | PRN

## 2023-06-13 MED ORDER — LORATADINE 10 MG PO TABS
10.0000 mg | ORAL_TABLET | Freq: Once | ORAL | Status: AC
  Administered 2023-06-13: 10 mg via ORAL
  Filled 2023-06-13: qty 1

## 2023-06-13 MED ORDER — VALSARTAN 320 MG PO TABS: 320.0000 mg | ORAL_TABLET | Freq: Every day | ORAL | 0 refills | Status: DC

## 2023-06-13 NOTE — ED Notes (Signed)
 Patient given sandwich tray and juice at time of discharge.

## 2023-06-13 NOTE — ED Notes (Signed)
 Pt refused to sign waiver in triage.

## 2023-06-13 NOTE — ED Provider Notes (Signed)
 [facility] Provider Note    Event Date/Time   First MD Initiated Contact with Patient 06/13/23 0019     (approximate)   History   Chills   HPI Angela [name] is a 73 y.o. female well-known to the emergency department with multiple visits in the past, mostly secondary to her undomiciled status and her well-documented personality disorder and malingering.  She frequently presents with complaints about abnormal labs or abnormal electrolytes and then it becomes an issue of her having no place to go or stay.  She has well-documented splitting behavior and is frequently argumentative with providers and staff.  This is true within the [facility] health system as well as at [facility], documented as recently as about 10 days ago at [facility].  She presents tonight reporting that she feels congested with a stuffy nose and general malaise.  She said that she has been staying in a hotel and a staff member there has been sick and she thinks that she caught it.  She said that she has a stuffy nose and feels like her head is full and she has had a mild cough.  No shortness of breath.  She reports that she has a nebulizer with her.  Of note, she came with multiple bags and a cart of her belongings.  She states that she is certain that she has an elevated temperature because her normal temperature is several degrees lower so her 98.6 tonight is actually a fever.  She expresses concern over her living situation because she has been in a "dank and damp hotel room".  She said that people have not been trying to help her and that she has limited resources.     Physical Exam   Triage Vital Signs: ED Triage Vitals  Encounter Vitals Group     BP 06/13/23 0017 (!) 146/76     Systolic BP Percentile --      Diastolic BP Percentile --      Pulse Rate 06/13/23 0017 75     Resp 06/13/23 0017 18     Temp 06/13/23 0017 98.6 F (37 C)     Temp Source 06/13/23 0017 Oral     SpO2 06/13/23  0017 100 %     Weight --      Height --      Head Circumference --      Peak Flow --      Pain Score 06/13/23 0016 0     Pain Loc --      Pain Education --      Exclude from Growth Chart --     Most recent vital signs: Vitals:   06/13/23 0017  BP: (!) 146/76  Pulse: 75  Resp: 18  Temp: 98.6 F (37 C)  SpO2: 100%    General: Awake,  Alert, well-kempt, arriving with all of her belongings.  Generally well-appearing and in no distress. CV:  Good peripheral perfusion.  Regular rate and rhythm, normal heart sounds. Resp:  Normal effort. Speaking easily and comfortably, no accessory muscle usage nor intercostal retractions.  Lungs are clear to auscultation.  Patient had 1 small cough when I asked her to take a deep breath, otherwise no breathing abnormalities. Abd:  No distention.  Other:  Patient appears to have some nasal congestion and sounds stuffy when she talks.  Her ears are mildly ceruminous but with no evidence of active infection.  Her oropharynx is clear and nonerythematous with no exudate or  purulence, widely patent airway, no swelling in the posterior oropharynx.   ED Results / Procedures / Treatments   Labs (all labs ordered are listed, but only abnormal results are displayed) Labs Reviewed - No data to display   PROCEDURES:  Critical Care performed: No  Procedures    IMPRESSION / MDM / ASSESSMENT AND PLAN / ED COURSE  I reviewed the triage vital signs and the nursing notes.                              Differential diagnosis includes, but is not limited to, viral illness, secondary gain, malingering, less likely bacterial sinusitis or other bacterial infection.  Patient's presentation is most consistent with exacerbation of chronic illness.    Patient is very well-appearing with normal vitals other than mild hypertension which is actually better than it has been for her in the past.  This is her seventh ED visit in 6 months and she most recently was at  [facility] about 10 days ago.  There was a well-documented challenging interaction between her and the providers and the nurses.  They attempted to help her with social work assistance, outpatient resources, etc.  Eventually she got upset and left on her own.  Looking back through prior emergency department visits at [facility], including 5 prior visits during which I have participated in her care over the last 8 years, there is a very similar pattern.  My evaluation with the patient occurred in the presence of her nurse, Franki Isles.  I provided a thorough medical screening exam and talked with the patient about her current difficulties including her viral symptoms as well as her living situation.  I explained that there is no evidence that she has an emergent medical condition nor a bacterial infection that requires antibiotics.  I offered multiple potential options (for example, fluticasone nasal spray that may help with her congestion) but she had a reason for each one why she did not want it.  I acknowledged the difficulties with transportation and with her housing but explained that we could not keep her in the emergency department simply because of her unfortunate living situation.  There is no evidence that she has an emergent medical condition and I referred her to PCP system so that she can get established as an outpatient.  She eventually accepted a dose of Tessalon  and a Claritin.  ED charge nurse Gonzalo Lather will talk with her about options for her either for transportation back to her motel or a safe place for her to stay, not in an exam room, since it is the middle of the night and raining  The patient's medical screening exam is reassuring with no indication of an emergent medical condition requiring hospitalization or additional evaluation at this point.  The patient is safe and appropriate for discharge and outpatient follow up.       FINAL CLINICAL IMPRESSION(S) / ED DIAGNOSES   Final  diagnoses:  Viral URI with cough     Rx / DC Orders   ED Discharge Orders          Ordered    benzonatate  (TESSALON  PERLES) 100 MG capsule  3 times daily PRN        06/13/23 0051    Ambulatory Referral to Primary Care (Establish Care)        06/13/23 0054             Note:  This document was prepared using Dragon voice recognition software and may include unintentional dictation errors.   Lynnda Sas, MD 06/13/23 [phone]

## 2023-06-13 NOTE — ED Triage Notes (Signed)
 Pt reports she has had chills for the past few days, pt has been staying at a hotel in [place] where multiple staff members have been sick.

## 2023-06-13 NOTE — ED Notes (Signed)
 Pt was seen and d/c last night; per hosp security, pt has apparently been sitting in the medical mall lobby since this morning and refuses to leave, was eating a salad PTA and informed that she either needed to evacuate the premises or if in need of medical attention, present to the ED; pt was escorted back to ED lobby with numerous suitcases and tote bags; pt st she is homeless and has no where to go and wants to check back into the hospital

## 2023-06-13 NOTE — ED Triage Notes (Signed)
 Pt ambulatory to triage with a walker.  Pt reports she has abd pain.  Pt reports she has an ulcer.  Pt reports she is homeless and needs help   pt alert.

## 2023-06-13 NOTE — ED Notes (Addendum)
 Patient is argumentative with Dr. Lajuana Pilar, this RN, and charge RN Arlene Ben. Patient rambling about issues and concerns that are not related to chief complaint that prompted today's visit. Despite redirection attempts the patient continues to deviate from the focus of care. Staff efforts to guide the conversation toward relevant clinical information continue to be met with resistance by patient.

## 2023-06-13 NOTE — Discharge Instructions (Signed)
 Please try using over-the-counter medications to alleviate your viral symptoms.  We also provided some prescriptions that may help with your symptoms.  An order has been placed to help you establish primary care; a primary care office representative should reach out to you soon to help you schedule a follow-up appointment.

## 2023-06-14 ENCOUNTER — Other Ambulatory Visit: Payer: Self-pay

## 2023-06-14 ENCOUNTER — Emergency Department
Admission: EM | Admit: 2023-06-14 | Discharge: 2023-06-14 | Disposition: A | Discharge status: Home / Self Care | Source: Home / Self Care | Attending: Emergency Medicine | Admitting: Emergency Medicine

## 2023-06-14 ENCOUNTER — Ambulatory Visit

## 2023-06-14 DIAGNOSIS — R1013 Epigastric pain: Secondary | ICD-10-CM

## 2023-06-14 DIAGNOSIS — J069 Acute upper respiratory infection, unspecified: Secondary | ICD-10-CM | POA: Diagnosis not present

## 2023-06-14 LAB — COMPREHENSIVE METABOLIC PANEL WITH GFR
ALT: 19 U/L (ref 0–44)
AST: 22 U/L (ref 15–41)
Albumin: 3.8 g/dL (ref 3.5–5.0)
Alkaline Phosphatase: 87 U/L (ref 38–126)
Anion gap: 8 (ref 5–15)
BUN: 19 mg/dL (ref 8–23)
CO2: 25 mmol/L (ref 22–32)
Calcium: 9.7 mg/dL (ref 8.9–10.3)
Chloride: 103 mmol/L (ref 98–111)
Creatinine, Ser: 0.76 mg/dL (ref 0.44–1.00)
GFR, Estimated: >60 mL/min (ref >60)
Glucose, Bld: 96 mg/dL (ref 70–99)
Potassium: 4.2 mmol/L (ref 3.5–5.1)
Sodium: 136 mmol/L (ref 135–145)
Total Bilirubin: 0.5 mg/dL (ref 0.0–1.2)
Total Protein: 6.8 g/dL (ref 6.5–8.1)

## 2023-06-14 LAB — CBC WITH DIFFERENTIAL/PLATELET
Abs Immature Granulocytes: 0.02 10*3/uL (ref 0.00–0.07)
Basophils Absolute: 0.1 10*3/uL (ref 0.0–0.1)
Basophils Relative: 1 %
Eosinophils Absolute: 0.3 10*3/uL (ref 0.0–0.5)
Eosinophils Relative: 5 %
HCT: 36.6 % (ref 36.0–46.0)
Hemoglobin: 12.1 g/dL (ref 12.0–15.0)
Immature Granulocytes: 0 %
Lymphocytes Relative: 31 %
Lymphs Abs: 1.8 10*3/uL (ref 0.7–4.0)
MCH: 30.2 pg (ref 26.0–34.0)
MCHC: 33.1 g/dL (ref 30.0–36.0)
MCV: 91.3 fL (ref 80.0–100.0)
Monocytes Absolute: 0.6 10*3/uL (ref 0.1–1.0)
Monocytes Relative: 10 %
Neutro Abs: 3.1 10*3/uL (ref 1.7–7.7)
Neutrophils Relative %: 53 %
Platelets: 306 10*3/uL (ref 150–400)
RBC: 4.01 MIL/uL (ref 3.87–5.11)
RDW: 14 % (ref 11.5–15.5)
WBC: 5.8 10*3/uL (ref 4.0–10.5)
nRBC: 0 % (ref 0.0–0.2)

## 2023-06-14 LAB — LIPASE, BLOOD: Lipase: 71 U/L — ABNORMAL HIGH (ref 11–51)

## 2023-06-14 MED ORDER — PANTOPRAZOLE SODIUM 40 MG PO TBEC
40.0000 mg | DELAYED_RELEASE_TABLET | Freq: Every day | ORAL | 1 refills | Status: DC
  Filled 2023-06-14: qty 30, 30d supply, fill #0

## 2023-06-14 MED ORDER — LIDOCAINE VISCOUS HCL 2 % MT SOLN
15.0000 mL | Freq: Once | OROMUCOSAL | Status: AC
  Administered 2023-06-14: 15 mL via ORAL
  Filled 2023-06-14: qty 15

## 2023-06-14 MED ORDER — ALUM & MAG HYDROXIDE-SIMETH 200-200-20 MG/5ML PO SUSP
30.0000 mL | Freq: Once | ORAL | Status: AC
  Administered 2023-06-14: 30 mL via ORAL
  Filled 2023-06-14: qty 30

## 2023-06-14 MED ORDER — ENSURE PLANT-BASED PROTEIN PO LIQD
1.0000 [IU] | ORAL | 3 refills | Status: DC | PRN
  Filled 2023-06-14: qty 330, fill #0

## 2023-06-14 NOTE — Discharge Instructions (Signed)
 Thank you for choosing [place] for your health care today!  Please see your primary doctor this week for a follow up appointment.   If you have any new, worsening, or unexpected symptoms call your doctor right away or come back to the emergency department for reevaluation.  It was my pleasure to care for you today.   Daneil Dan Modesto Charon, MD

## 2023-06-14 NOTE — ED Notes (Signed)
 Pt ambulatory to STAT desk to inquire over wait time; pt informed that she will be taken to an exam room as soon as one is available; pt is upset that she is not being given food and drink "as she has had none all day"; pt informed due to her presenting symptom of abd pain she should remain NPO until evaluated further; pt upset wanting an administrator's name; pt given patient experience business card to contact for complaints

## 2023-06-14 NOTE — ED Notes (Signed)
 Security called to Doctor, hospital with difficult pt discharge.

## 2023-06-14 NOTE — ED Notes (Signed)
 Discharge instructions provided to pt at this time with security present. Pt not allowing writer to complete discharge and sts, "I am old and hungry and need to sleep." Pt reminded that she was provided with a Child psychotherapist on campus that came to speak with her on 06/13/23 in [place] and who found housing for pt but pt refused and instead malingered in medical mall until after hours when security brought pt to ED. Pt becoming boisterous and non-compliant. Pt demanding that she is leaving but demanding staff help her push her luggage out. Pt reported that someone who works at hospital was taking her home at [place]. Clinical research associate requested that if she provided contact information and Clinical research associate confirmed with that staff member that she could sit and wait for them. Pt refused and sts, "I'm not telling you anything. You are a disgrace to healthcare." Security present informed pt that she would need to leave hospital property at this time and that staff could assist with moving her bags/luggage but would only go as far as end of property with them. Pt at this time jumped up from the wheelchair, turned quickly and grabbed her walker that officer was pushing and started to yell at officer as well as threaten him with her arm and fist raised, stating, "Don't touch that! Get your hands off of it. 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 I will report you to [place] and all the authorities." Police and security present attempting to calm pt and direct her to leave property without having to be arrested. Pt finally agrees to leave and staff accompany with pts bags/luggage off property. Prior to reaching end of property, pt stops and gets out her cell phone and starts to record and video staff pushing her belongings. Staff place pts belongings off of roadway onto flat/dry surface with plenty of clearance from traffic or roadway. [employer] sitting in hospital parking lot and informed  of situation.

## 2023-06-14 NOTE — ED Provider Notes (Signed)
 [facility] Provider Note    Event Date/Time   First MD Initiated Contact with Patient 06/14/23 0117     (approximate)   History   Abdominal Pain and Homeless   HPI  Angela [name] is a 73 y.o. female   Past medical history of multiple medical comorbidities who is here with a chief complaint of "I am homeless and I need help", primarily requesting help with housing and food.  She has been seen in the emergency department multiple times in the last several weeks both here and at outside hospitals with various complaints.  Most recently she was seen yesterday here in our emergency department with cold chills and noted by the provider a history of well-documented splitting behavior, frequently argumentative with providers and staff, noted at multiple medical systems, and a well-documented history of personality disorder and malingering as well.  She has gotten resources by social work regarding housing and food but is not satisfied with her ongoing living situation.  When I tell her that I am unfortunately unable to provide housing for her or ensure ongoing food security beyond providing resources similar to that of social worker in the past, she then tells me that she has an ulcer in her stomach which has been inadequately treated due to lack of access to her PPI medication.   External Medical Documents Reviewed: Hospital notes from her visit yesterday      Physical Exam   Triage Vital Signs: ED Triage Vitals  Encounter Vitals Group     BP 06/13/23 2251 (!) 149/78     Systolic BP Percentile --      Diastolic BP Percentile --      Pulse Rate 06/13/23 2251 75     Resp 06/13/23 2251 18     Temp 06/13/23 2251 98.7 F (37.1 C)     Temp Source 06/13/23 2251 Oral     SpO2 06/13/23 2251 100 %     Weight 06/13/23 2249 138 lb 14.2 oz (63 kg)     Height 06/13/23 2249 5\' 2"  (1.575 m)     Head Circumference --      Peak Flow --      Pain Score 06/13/23  2249 10     Pain Loc --      Pain Education --      Exclude from Growth Chart --     Most recent vital signs: Vitals:   06/13/23 2251 06/14/23 0303  BP: (!) 149/78 (!) 173/85  Pulse: 75 65  Resp: 18 18  Temp: 98.7 F (37.1 C) 98.6 F (37 C)  SpO2: 100% 98%    General: Awake, no distress.  CV:  Good peripheral perfusion.  Resp:  Normal effort.  Abd:  No distention.  Other:  Hypertensive otherwise vital signs normal, afebrile and nontoxic-appearing.  Ambulating with a steady gait.  She has soft nontender abdomen to deep palpation all quadrants without any rigidity or guarding.   ED Results / Procedures / Treatments   Labs (all labs ordered are listed, but only abnormal results are displayed) Labs Reviewed  LIPASE, BLOOD - Abnormal; Notable for the following components:      Result Value   Lipase 71 (*)    All other components within normal limits  COMPREHENSIVE METABOLIC PANEL WITH GFR  CBC WITH DIFFERENTIAL/PLATELET     I ordered and reviewed the above labs they are notable for cell counts electrolytes LFTs unremarkable, lipase slightly elevated at 71.  PROCEDURES:  Critical Care performed: No  Procedures   MEDICATIONS ORDERED IN ED: Medications  alum & mag hydroxide-simeth (MAALOX/MYLANTA) 200-200-20 MG/5ML suspension 30 mL (30 mLs Oral Given 06/14/23 0254)    And  lidocaine  (XYLOCAINE ) 2 % viscous mouth solution 15 mL (15 mLs Oral Given 06/14/23 0254)     IMPRESSION / MDM / ASSESSMENT AND PLAN / ED COURSE  I reviewed the triage vital signs and the nursing notes.                                Patient's presentation is most consistent with severe exacerbation of chronic illness.  Differential diagnosis includes, but is not limited to, homelessness, food insecurity, considered but less likely intra-abdominal infection/obstruction   The patient is on the cardiac monitor to evaluate for evidence of arrhythmia and/or significant heart rate  changes.  MDM:    She is here with a chief complaint of social issues including food insecurity and homelessness.  I advise she follow-up with resources previously given.  Later she has a chief medical complaint of upper abdominal pain for which I can provide a GI cocktail and a prescription to our [place] pharmacy for her PPI, but given reassuring lab work and benign abdominal exam I doubt surgical abdominal pathology at this time and will defer advanced imaging.  Otherwise she appears stable for discharge.       FINAL CLINICAL IMPRESSION(S) / ED DIAGNOSES   Final diagnoses:  Epigastric pain     Rx / DC Orders   ED Discharge Orders          Ordered    Nutritional Supplements (ENSURE PLANT-BASED PROTEIN) LIQD  As needed        06/14/23 0329    pantoprazole  (PROTONIX ) 40 MG tablet  Daily        06/14/23 0329             Note:  This document was prepared using Dragon voice recognition software and may include unintentional dictation errors.    Buell Carmin, MD 06/14/23 [phone]

## 2023-06-14 NOTE — ED Notes (Addendum)
 Pt retrieved from [facility] to come to treatment area. Pt attempting to leave belonging in [facility] and instructed that she is responsible for her belongings and they will need to come back with her. Pt sts, "well they were dropped off and were at the front last night when I was here." Pt provided that she would not be able to leave her belongings out in [place] and that they would need to come to treatment area with her. Pt also provided education on hospital policy of malingering and instructed that she would not be able to stay in the hospital waiting areas 24/7 and the she has had social work and other staff provide her with resources for housing but did not follow through with them and malingered in medical mall until security escorted pt to ED because she wanted to be seen for a abdominal ulcer. Pt sts, "I'm a senior citizen and need respect. I can't tote my bags."  Writer and 1st nurse escorted pt back to treatment area with her belongings.

## 2023-06-14 NOTE — ED Notes (Signed)
 Pt now at vending machines in the lobby, holding bottle of ensure and bottle of water

## 2023-06-28 ENCOUNTER — Ambulatory Visit: Admitting: Family Medicine

## 2023-07-03 ENCOUNTER — Other Ambulatory Visit: Payer: Self-pay

## 2023-07-03 ENCOUNTER — Ambulatory Visit: Payer: Self-pay

## 2023-07-03 ENCOUNTER — Ambulatory Visit
Admission: RE | Admit: 2023-07-03 | Discharge: 2023-07-03 | Disposition: A | Discharge status: Home / Self Care | Source: Ambulatory Visit | Attending: Emergency Medicine | Admitting: Emergency Medicine

## 2023-07-03 VITALS — BP 176/83 | HR 76 | Temp 100.6°F | Resp 22

## 2023-07-03 DIAGNOSIS — R21 Rash and other nonspecific skin eruption: Secondary | ICD-10-CM

## 2023-07-03 DIAGNOSIS — R509 Fever, unspecified: Secondary | ICD-10-CM | POA: Diagnosis not present

## 2023-07-03 MED ORDER — AZITHROMYCIN 500 MG PO TABS
500.0000 mg | ORAL_TABLET | Freq: Once | ORAL | Status: AC
  Administered 2023-07-03: 500 mg via ORAL

## 2023-07-03 MED ORDER — HYDRALAZINE HCL 25 MG PO TABS: 25.0000 mg | ORAL_TABLET | Freq: Three times a day (TID) | ORAL | 1 refills | Status: DC | PRN

## 2023-07-03 MED ORDER — VALSARTAN 320 MG PO TABS: 320.0000 mg | ORAL_TABLET | Freq: Every day | ORAL | 0 refills | Status: DC

## 2023-07-03 MED ORDER — IVERMECTIN 3 MG PO TABS
ORAL_TABLET | ORAL | 0 refills | Status: DC
Start: 2023-07-03 — End: 2023-07-09

## 2023-07-03 MED ORDER — HYDROXYZINE HCL 25 MG PO TABS: 25.0000 mg | ORAL_TABLET | Freq: Four times a day (QID) | ORAL | 0 refills | Status: DC

## 2023-07-03 MED ORDER — ALBUTEROL SULFATE HFA 108 (90 BASE) MCG/ACT IN AERS: INHALATION_SPRAY | RESPIRATORY_TRACT | 0 refills | Status: AC

## 2023-07-03 MED ORDER — ALBUTEROL SULFATE (2.5 MG/3ML) 0.083% IN NEBU
2.5000 mg | INHALATION_SOLUTION | Freq: Once | RESPIRATORY_TRACT | Status: AC
  Administered 2023-07-03: 2.5 mg via RESPIRATORY_TRACT

## 2023-07-03 MED ORDER — PANTOPRAZOLE SODIUM 40 MG PO TBEC: 40.0000 mg | DELAYED_RELEASE_TABLET | Freq: Every day | ORAL | 1 refills | Status: DC

## 2023-07-03 MED ORDER — FLUCONAZOLE 150 MG PO TABS: 150.0000 mg | ORAL_TABLET | ORAL | 0 refills | Status: DC | PRN

## 2023-07-03 MED ORDER — SUCRALFATE 1 G PO TABS: 1.0000 g | ORAL_TABLET | Freq: Three times a day (TID) | ORAL | 1 refills | Status: DC

## 2023-07-03 MED ORDER — LEVOTHYROXINE SODIUM 112 MCG PO TABS: 112.0000 ug | ORAL_TABLET | Freq: Every day | ORAL | 2 refills | Status: DC

## 2023-07-03 MED ORDER — AZITHROMYCIN 250 MG PO TABS: 250.0000 mg | ORAL_TABLET | Freq: Every day | ORAL | 0 refills | Status: DC

## 2023-07-03 MED ORDER — ACETAMINOPHEN 325 MG PO TABS
650.0000 mg | ORAL_TABLET | Freq: Once | ORAL | Status: AC
  Administered 2023-07-03: 650 mg via ORAL

## 2023-07-03 MED ORDER — ALLOPURINOL 100 MG PO TABS: 100.0000 mg | ORAL_TABLET | Freq: Every day | ORAL | 2 refills | Status: DC

## 2023-07-03 NOTE — Discharge Instructions (Addendum)
 Your evaluated for your fever and rash on the back  Take ivermectin tablet and then in 7 days take second dose for coverage of treatment of any parasites causing bug bites  Given first dose of azithromycin  for coverage of bacteria today in clinic, take remaining dose every morning for 4 days  Given breathing treatment here today in clinic, inhaler has been refilled  You had a fever of 100.6 here today in clinic, given Tylenol   COVID and flu testing negative  Please follow-up as needed  Medicine has been refilled, please keep upcoming PCP appointment

## 2023-07-03 NOTE — ED Triage Notes (Signed)
 Complains of fever and rash.  Yesterday, noticed itching all over

## 2023-07-03 NOTE — ED Notes (Signed)
 Breathing treatment is complete,  feels there was some improvment

## 2023-07-03 NOTE — Telephone Encounter (Signed)
 FYI Only or Action Required?: FYI only for provider  Patient was last seen in primary care on - needs new PCP.  Called Nurse Triage reporting Fever. Symptoms began several days ago. Interventions attempted: Nothing. Symptoms are: gradually worsening.  Triage Disposition: See PCP When Office is Open (Within 3 Days) - going to [facility] today.  Patient/caregiver understands and will follow disposition?: Yes       Copied from CRM [id number]. Topic: Clinical - Red Word Triage >> Jul 03, 2023  9:04 AM Ethelle Herb L wrote: Red Word that prompted transfer to Nurse Triage: fever, itchiness, possible bug bites, sleeping outside Reason for Disposition  [1] Fever comes and goes (intermittent) AND [2] lasts > 3 weeks  Answer Assessment - Initial Assessment Questions 1. TEMPERATURE: What is the most recent temperature?  How was it measured?      Patient is unsure, she states she knows she is feverish but not having a way to check it.  2. ONSET: When did the fever start?      today 4. OTHER SYMPTOMS: Do you have any other symptoms besides the fever?  (e.g., abdomen pain, cough, diarrhea, earache, headache, sore throat, urination pain)     Sinuses are swollen and painful, fever 5. CAUSE: If there are no symptoms, ask: What do you think is causing the fever?      Patient states she hasn't slept well, she is considered homeless and in a tough spot 8. IMMUNOCOMPROMISE: Do you have of the following: diabetes, HIV positive, splenectomy, cancer chemotherapy, chronic steroid treatment, transplant patient, etc.     Denies diabetes   Patient has been homeless and in a bad living situation. She states she hasn't had access to medications, walker, credit cards, a bed, etc. She has had to sleep outside. Patient will be going to UC today for her current symptoms but wants New Pt appt set up.  Protocols used: [facility]

## 2023-07-03 NOTE — ED Provider Notes (Signed)
 [name]    CSN: [id number] Arrival date & time: 07/03/23  1410      History   Chief Complaint Chief Complaint  Patient presents with   Fever    Entered by patient    HPI Angela [name] is a 73 y.o. female.   Patient presents for evaluation of erythematous pruritic rash present to the back and a fever beginning 3 days ago.  Endorses that she was bitten by several unknown insects while sleeping outside due to homelessness.  Has not attempted treatment.  Denies drainage, URI symptoms.  Past Medical History:  Diagnosis Date   Agitation 09/18/2017   Allergic rhinitis    Altered mental status, unspecified 12/25/2018   Anemia    Blurred vision    Chest pain of uncertain etiology 05/31/2018   Confusion state 12/25/2018   COPD (chronic obstructive pulmonary disease) (HCC)    Depression    Diverticulosis    Endometriosis    Falls    GERD (gastroesophageal reflux disease)    Hernia, hiatal    Hypothyroidism    IBS (irritable bowel syndrome)    Labile mood    Malignant neoplasm of skin    Malnutrition of mild degree (HCC) 08/08/2018   Migraine    Neuropathy    Noncompliance 12/25/2017   PTSD (post-traumatic stress disorder)    SIADH (syndrome of inappropriate ADH production) (HCC) 09/08/2016   Thyroid  disease     Patient Active Problem List   Diagnosis Date Noted   Stress reaction causing mixed disturbance of emotion and conduct 09/10/2021   Homeless    Mixed hyperlipidemia 07/02/2019   Hypothyroidism due to acquired atrophy of thyroid  04/29/2019   Osteopenia determined by x-ray 04/29/2019   Acquired claw toe of both feet 10/23/2018   Aortic atherosclerosis (HCC) 05/31/2018   Achilles tendon disorder 04/25/2018   Diastolic dysfunction 03/29/2018   Mitral regurgitation 03/29/2018   Personality disorder (HCC) 12/25/2017   Arthritis of hand, degenerative 12/07/2017   Hallux valgus, acquired 07/05/2017   Hammer toes of both feet 07/05/2017   Unstable  ankle 07/05/2017   Tinea unguium 05/08/2017   Conductive hearing loss of right ear with unrestricted hearing of left ear 04/03/2017   Adjustment disorder with depressed mood 01/18/2017   Migraine 01/18/2017   Irritable bowel syndrome with constipation 11/17/2016   Bilateral carpal tunnel syndrome 11/17/2016   Plantar fasciitis, bilateral 11/17/2016   Cervical myofascial pain syndrome 11/17/2016   Chronic hyponatremia 11/17/2016   PTSD (post-traumatic stress disorder) 11/17/2016   Hiatal hernia 11/17/2016   Vitamin D  deficiency 11/17/2016   Vitamin B12 deficiency 11/17/2016   Cervical spondylosis with myelopathy 05/24/2016   MCI (mild cognitive impairment) with memory loss 03/30/2016   Gait abnormality 03/30/2016   Chronic bipolar affective disorder (HCC) 01/24/2016   Anemia, iron deficiency 06/24/2015   Benign essential HTN 06/24/2015   History of falling 05/22/2015   History of TIA (transient ischemic attack) 04/20/2015   Gastroesophageal reflux disease with hiatal hernia 04/20/2015   Cervical disc disorder at C5-C6 level with radiculopathy 03/25/2015   Chronic neck pain 03/25/2015   Pelvic pain in female 10/30/2014   History of skin cancer 06/30/2014   Chronic headaches 12/16/2013   Neuropathy 12/16/2013    Past Surgical History:  Procedure Laterality Date   APPENDECTOMY     CHOLECYSTECTOMY     CHOLECYSTECTOMY, LAPAROSCOPIC     ESOPHAGOGASTRODUODENOSCOPY (EGD) WITH PROPOFOL  N/A 03/29/2015   Procedure: ESOPHAGOGASTRODUODENOSCOPY (EGD) WITH PROPOFOL ;  Surgeon: Arlie Benedict  Leatrice Provost, MD;  Location: [facility] ENDOSCOPY;  Service: Endoscopy;  Laterality: N/A;   HERNIA REPAIR     laproscopy     TONSILLECTOMY     uteral suspension      OB History     Gravida  1   Para      Term      Preterm      AB  1   Living  0      SAB      IAB  1   Ectopic      Multiple      Live Births               Home Medications    Prior to Admission medications   Medication  Sig Start Date End Date Taking? Authorizing Provider  azithromycin  (ZITHROMAX ) 250 MG tablet Take 1 tablet (250 mg total) by mouth daily for 4 days. Take first 2 tablets together, then 1 every day until finished. 07/03/23 07/07/23 Yes Torez Beauregard R, NP  fluconazole  (DIFLUCAN ) 150 MG tablet Take 1 tablet (150 mg total) by mouth every three (3) days as needed for up to 2 doses. 07/03/23  Yes Kento Gossman R, NP  hydrOXYzine  (ATARAX ) 25 MG tablet Take 1 tablet (25 mg total) by mouth every 6 (six) hours. 07/03/23  Yes Angeles Zehner R, NP  pantoprazole  (PROTONIX ) 40 MG tablet Take 1 tablet (40 mg total) by mouth daily. 07/03/23  Yes Makyiah Lie R, NP  sucralfate  (CARAFATE ) 1 g tablet Take 1 tablet (1 g total) by mouth 4 (four) times daily -  with meals and at bedtime. 07/03/23 09/01/23 Yes Deshay Blumenfeld R, NP  acetaminophen  (TYLENOL ) 650 MG CR tablet Take 650 mg by mouth every 8 (eight) hours as needed for pain.    [provider]  Acetylcysteine (NAC) 500 MG CAPS Take 500 mg by mouth 2 (two) times a day.    [provider]  albuterol  (VENTOLIN  HFA) 108 (90 Base) MCG/ACT inhaler albuterol  sulfate HFA 90 mcg/actuation aerosol inhaler  INHALE 2 PUFFS BY MOUTH EVERY 4 HOURS AS NEEDED 07/03/23   Danielle Lento R, NP  allopurinol  (ZYLOPRIM ) 100 MG tablet Take 1 tablet (100 mg total) by mouth daily. 04/25/23   Buell Carmin, MD  allopurinol  (ZYLOPRIM ) 100 MG tablet Take 1 tablet (100 mg total) by mouth daily. 07/03/23 10/01/23  [name], NP  amLODipine  (NORVASC ) 5 MG tablet Take 1 tablet (5 mg total) by mouth daily. 05/07/23 05/06/24  Kandee Orion, MD  ascorbic acid  (VITAMIN C ) 1000 MG tablet Take 2,000 mg by mouth daily.    [provider]  benzonatate  (TESSALON  PERLES) 100 MG capsule Take 1 capsule (100 mg total) by mouth 3 (three) times daily as needed for cough. 06/13/23   Lynnda Sas, MD  calcium  carbonate (OSCAL) 1500 (600 Ca) MG TABS tablet Calcium  600 mg calcium   (1,500 mg) tablet  Take 1 tablet 3 times a day by oral route.    [provider]  Calcium  Citrate (CITRACAL PO) Take 800 mg by mouth. Takes 2 (400mg ) tablets BID    [provider]  Cholecalciferol  100 MCG (4000 UT) CAPS Take 3 capsules by mouth.    [provider]  colchicine  0.6 MG tablet Take 2 tablets ( 1.2 mg ) then in 1 hour take 1 tablet ( 0.6 mg),  then take 1 tablet ( 0.6 mg ) daily for 6 days Patient not taking: Reported on 05/07/2023 01/15/23  Rosselyn Martha R, NP  Cyanocobalamin  (VITAMIN B-12) 1000 MCG SUBL Place 1,000 mcg under the tongue.    [provider]  diclofenac  Sodium (VOLTAREN ) 1 % GEL Apply 4 g topically 4 (four) times daily. 01/31/23   McDonald, Olive Better, DPM  dicyclomine  (BENTYL ) 20 MG tablet Take 20 mg by mouth every 6 (six) hours.    [provider]  doxycycline  (VIBRA -TABS) 100 MG tablet Take 100 mg by mouth 2 (two) times daily. 02/13/23   [provider]  EPINEPHrine  0.3 mg/0.3 mL IJ SOAJ injection Inject into the skin. 03/01/22   [provider]  estradiol  (ESTRACE ) 0.1 MG/GM vaginal cream Place 1 Applicatorful vaginally daily.    [provider]  famotidine  (PEPCID ) 20 MG tablet Take 1 tablet (20 mg total) by mouth 2 (two) times daily as needed for heartburn or indigestion. 11/26/18   Thersia Flax, MD  famotidine  (PEPCID ) 40 MG tablet TAKE 1 TABLET AT BEDTIME ORALLY ONCE A DAY 90 DAYS Patient not taking: Reported on 05/06/2023    [provider]  ferrous sulfate  325 (65 FE) MG tablet Take 1 tablet (325 mg total) by mouth daily with breakfast. Patient not taking: Reported on 05/06/2023 04/29/19   Sheron Dixons, MD  hydrALAZINE  (APRESOLINE ) 25 MG tablet Take 1 tablet (25 mg total) by mouth 3 (three) times daily as needed (for pressure >150). 07/03/23 10/01/23  [name], NP  HYDROcodone -acetaminophen  (NORCO/VICODIN) 5-325 MG tablet Take 1 tablet by mouth every 6 (six) hours as  needed. 04/19/20   [provider]  hydrOXYzine  (ATARAX /VISTARIL ) 10 MG tablet TAKE 1 TABLET BY MOUTH EVERY 8 HOURS AS NEEDED Patient taking differently: Take 25 mg by mouth every 4 (four) hours as needed. 06/04/19   Sheron Dixons, MD  Icosapent  Ethyl (VASCEPA ) 1 g CAPS TAKE 2 CAPSULES BY MOUTH TWICE DAILY (TO HELP WITH TRIGLYCERIDES) Patient not taking: Reported on 05/06/2023 11/26/18   Thersia Flax, MD  ivermectin (STROMECTOL) 3 MG TABS tablet TAKE THREE TABLETS BY MOUTH ON AN EMPTY STOMACH. REPEAT IN ONE WEEK. WASH BEDDING, TOWELS, WORN CLOTHES IN HOT WATER AFTER EACH DOSE 07/03/23   Lyberti Thrush R, NP  ketoconazole (NIZORAL) 2 % cream  02/04/19   [provider]  Lactulose  20 GM/30ML SOLN 30 ml every 4 hours until constipation is relieved Patient not taking: Reported on 05/06/2023 04/29/19   Sheron Dixons, MD  levalbuterol  (XOPENEX ) 1.25 MG/3ML nebulizer solution Take 3 mLs by nebulization every 6 (six) hours as needed for wheezing.    [provider]  levothyroxine  (SYNTHROID ) 112 MCG tablet Take 1 tablet (112 mcg total) by mouth daily before breakfast. 04/25/23 07/24/23  Buell Carmin, MD  levothyroxine  (SYNTHROID ) 112 MCG tablet Take 1 tablet (112 mcg total) by mouth daily. 07/03/23 10/01/23  [name], NP  Lidocaine  4 % PTCH     [provider]  linaclotide  (LINZESS ) 290 MCG CAPS capsule Take 1 capsule (290 mcg total) by mouth daily before breakfast. Patient not taking: Reported on 03/06/2023 12/26/17   Clapacs, Elida Grounds, MD  LORazepam  (ATIVAN ) 1 MG tablet Take 1 mg by mouth every 4 (four) hours as needed for sleep. 02/06/18   [provider]  losartan  (COZAAR ) 100 MG tablet Take 1 tablet (100 mg total) by mouth daily. 05/07/23 05/06/24  Kandee Orion, MD  Magnesium  500 MG TABS Take 500 mg by mouth every other day.    [provider]  mupirocin ointment (BACTROBAN) 2 %  Apply 1 application  topically 3 (three) times daily. 02/04/19    [provider]  neomycin-bacitracin -polymyxin (NEOSPORIN) ointment Apply topically. Patient not taking: Reported on 03/06/2023 03/11/20   [provider]  Nutritional Supplements (ENSURE PLANT-BASED PROTEIN) LIQD Take 1 Units by mouth as needed. 06/14/23   Buell Carmin, MD  nystatin  (MYCOSTATIN /NYSTOP ) powder Apply 1 Application topically 3 (three) times daily. Patient not taking: Reported on 05/06/2023 01/15/23   [name], NP  ondansetron  (ZOFRAN -ODT) 4 MG disintegrating tablet Take 1 tablet (4 mg total) by mouth every 8 (eight) hours as needed for nausea or vomiting. 04/25/23   Buell Carmin, MD  pantoprazole  (PROTONIX ) 40 MG tablet Take 1 tablet (40 mg total) by mouth daily. 06/14/23 06/13/24  Buell Carmin, MD  PARoxetine  (PAXIL ) 20 MG tablet Take 1 tablet (20 mg total) by mouth daily. 04/25/23   Buell Carmin, MD  PARoxetine  (PAXIL ) 20 MG tablet Take 1 tablet (20 mg total) by mouth daily. 05/07/23 08/05/23  Kandee Orion, MD  polyethylene glycol-electrolytes (NULYTELY ) 420 g solution     [provider]  prednisoLONE acetate (PRED FORTE) 1 % ophthalmic suspension  07/26/18   [provider]  promethazine  (PHENERGAN ) 12.5 MG tablet Take 12.5 mg by mouth every 6 (six) hours as needed for nausea or vomiting. Patient not taking: Reported on 03/06/2023    [provider]  pyridoxine  (B-6) 100 MG tablet Take 1 tablet (100 mg total) by mouth 2 (two) times daily. Patient taking differently: Take 500 mg by mouth in the morning and at bedtime. 12/26/17   Clapacs, Elida Grounds, MD  REFRESH 1.4-0.6 % SOLN Apply to eye. Patient not taking: Reported on 03/06/2023 03/17/20   [provider]  Senna-Natural Laxatives (SENOKOT LAXATIVE) Tea Bag MISC     [provider]  sucralfate  (CARAFATE ) 1 GM/10ML suspension Take 10 mLs (1 g total) by mouth 4 (four) times daily. 04/25/23 04/24/24  Buell Carmin, MD  terconazole  (TERAZOL 7 ) 0.4 % vaginal cream     [provider]  Tiotropium Bromide  Monohydrate 2.5 MCG/ACT AERS Inhale into the lungs. 04/22/22 02/19/24  [provider]  tiZANidine  (ZANAFLEX ) 2 MG tablet Take by mouth every 6 (six) hours as needed for muscle spasms. Patient not taking: Reported on 03/06/2023    [provider]  traZODone  (DESYREL ) 50 MG tablet Take 0.5 tablets (25 mg total) by mouth at bedtime. 04/25/23   Buell Carmin, MD  traZODone  (DESYREL ) 50 MG tablet Take 0.5 tablets (25 mg total) by mouth at bedtime. 05/07/23 08/05/23  Kandee Orion, MD  triamcinolone  ointment (KENALOG ) 0.5 % Apply 1 application topically 2 (two) times daily. Patient not taking: Reported on 03/06/2023 11/26/18   Thersia Flax, MD  umeclidinium bromide  (INCRUSE ELLIPTA ) 62.5 MCG/INH AEPB Inhale 1 puff into the lungs daily. Patient not taking: Reported on 03/06/2023 11/26/18   Thersia Flax, MD  valsartan  (DIOVAN ) 320 MG tablet Take 1 tablet (320 mg total) by mouth daily. 07/03/23 08/02/23  [name], NP  vitamin C  (ASCORBIC ACID ) 250 MG tablet Take 1 tablet (250 mg total) by mouth daily. 05/07/23   Kandee Orion, MD  Vitamin D , Ergocalciferol , (DRISDOL ) 1.25 MG (50000 UNIT) CAPS capsule Take 1 capsule (50,000 Units total) by mouth every 7 (seven) days. Patient taking differently: Take 5,000 Units by mouth every 7 (seven) days. 05/19/19   Sheron Dixons, MD  XIIDRA 5 % SOLN Place 1 drop into both eyes 2 (two) times  daily. 07/27/18   [provider]    Family History Family History  Problem Relation Age of Onset   Heart disease Father    Hearing loss Father    COPD Father    Depression Father    Stroke Father    Vision loss Father    Varicose Veins Father    Cancer Mother        lung   Depression Sister    Arthritis Sister    Arthritis Brother    Hernia Brother    Anxiety disorder Brother    Arthritis Brother    Hernia Brother    Anxiety disorder Brother    Urolithiasis Neg Hx    Kidney disease Neg Hx    Kidney cancer Neg  Hx    Prostate cancer Neg Hx    Breast cancer Neg Hx     Social History Social History   Tobacco Use   Smoking status: Former    Current packs/day: 0.00    Types: Cigarettes    Quit date: 11/17/1976    Years since quitting: 46.6   Smokeless tobacco: Never  Vaping Use   Vaping status: Never Used  Substance Use Topics   Alcohol  use: Not Currently    Comment: occ   Drug use: No     Allergies   Amoxicillin -pot clavulanate, Aspirin, Bee venom, Cephalexin , Ciprofloxacin , Fluticasone-salmeterol, Haloperidol , Molds & smuts, Neomycin-polymyxin-dexameth, Other, Oxycodone-acetaminophen , Peanut butter flavoring agent (non-screening), Peanuts [peanut oil], Penicillin g, Penicillins, Propoxyphene, Sulfa antibiotics, Sulfacetamide sodium, Tramadol, Amikacin, Bee pollen, Imipramine, Prednisone, Sulfur, Azithromycin , Colchicine , Elemental sulfur, Fluticasone, Hymenoptera venom preparations, Lactose intolerance (gi), Oxycodone, Peanut-containing drug products, Phthalylsulfacetamide, Pregabalin, Risperidone, Sulfacetamide, Wound dressing adhesive, Adhesive [tape], Albuterol , and Hydroxyzine    Review of Systems Review of Systems   Physical Exam Triage Vital Signs ED Triage Vitals  Encounter Vitals Group     BP 07/03/23 1433 (!) 176/83     Girls Systolic BP Percentile --      Girls Diastolic BP Percentile --      Boys Systolic BP Percentile --      Boys Diastolic BP Percentile --      Pulse Rate 07/03/23 1433 76     Resp 07/03/23 1433 (!) 22     Temp 07/03/23 1433 (!) 100.6 F (38.1 C)     Temp Source 07/03/23 1433 Oral     SpO2 07/03/23 1433 99 %     Weight --      Height --      Head Circumference --      Peak Flow --      Pain Score 07/03/23 1428 8     Pain Loc --      Pain Education --      Exclude from Growth Chart --    No data found.  Updated Vital Signs BP (!) 176/83 (BP Location: Left Arm)   Pulse 76   Temp (!) 100.6 F (38.1 C) (Oral)   Resp (!) 22   SpO2 99%    Visual Acuity Right Eye Distance:   Left Eye Distance:   Bilateral Distance:    Right Eye Near:   Left Eye Near:    Bilateral Near:     Physical Exam Constitutional:      Appearance: Normal appearance.   Eyes:     Extraocular Movements: Extraocular movements intact.   Pulmonary:     Effort: Pulmonary effort is normal.   Skin:    Comments: Erythematous maculopapular rash present to  the back   Neurological:     Mental Status: She is alert and oriented to person, place, and time. Mental status is at baseline.      UC Treatments / Results  Labs (all labs ordered are listed, but only abnormal results are displayed) Labs Reviewed - No data to display  EKG   Radiology No results found.  Procedures Procedures (including critical care time)  Medications Ordered in UC Medications  acetaminophen  (TYLENOL ) tablet 650 mg (650 mg Oral Given 07/03/23 1447)  albuterol  (PROVENTIL ) (2.5 MG/3ML) 0.083% nebulizer solution 2.5 mg (2.5 mg Nebulization Given 07/03/23 1455)  azithromycin  (ZITHROMAX ) tablet 500 mg (500 mg Oral Given 07/03/23 1518)    Initial Impression / Assessment and Plan / UC Course  I have reviewed the triage vital signs and the nursing notes.  Pertinent labs & imaging results that were available during my care of the patient were reviewed by me and considered in my medical decision making (see chart for details).  Fever, rash  Most likely unrelated, no signs of infection to the rash, appearing inflammatory, possible insect bites, fever 100.6 noted in triage, given Tylenol , denies respiratory symptoms however endorses that she no longer has access to her Spiriva  and albuterol  inhaler, breathing treatment given in clinic, lungs clear to auscultation prior to and after treatment given an O2 saturation maintaining above 90% without assistance, COVID and flu testing are negative, discussed findings with patient, empirically placed on azithromycin  due to rash and  fever, given first dose in clinic due to financial strain requesting ivermectin, obliged as reasonable due to possible insect rash, advised follow-up as needed Final Clinical Impressions(s) / UC Diagnoses   Final diagnoses:  Fever, unspecified  Rash and nonspecific skin eruption     Discharge Instructions      Your evaluated for your fever and rash on the back  Take ivermectin tablet and then in 7 days take second dose for coverage of treatment of any parasites causing bug bites  Given first dose of azithromycin  for coverage of bacteria today in clinic, take remaining dose every morning for 4 days  Given breathing treatment here today in clinic, inhaler has been refilled  You had a fever of 100.6 here today in clinic, given Tylenol   COVID and flu testing negative  Please follow-up as needed  Medicine has been refilled, please keep upcoming PCP appointment   ED Prescriptions     Medication Sig Dispense Auth. Provider   azithromycin  (ZITHROMAX ) 250 MG tablet Take 1 tablet (250 mg total) by mouth daily for 4 days. Take first 2 tablets together, then 1 every day until finished. 4 tablet Jahzeel Poythress R, NP   albuterol  (VENTOLIN  HFA) 108 (90 Base) MCG/ACT inhaler albuterol  sulfate HFA 90 mcg/actuation aerosol inhaler  INHALE 2 PUFFS BY MOUTH EVERY 4 HOURS AS NEEDED 8 g Trudi Morgenthaler R, NP   allopurinol  (ZYLOPRIM ) 100 MG tablet Take 1 tablet (100 mg total) by mouth daily. 30 tablet Raidon Swanner R, NP   levothyroxine  (SYNTHROID ) 112 MCG tablet Take 1 tablet (112 mcg total) by mouth daily. 30 tablet Dashana Guizar R, NP   fluconazole  (DIFLUCAN ) 150 MG tablet Take 1 tablet (150 mg total) by mouth every three (3) days as needed for up to 2 doses. 2 tablet Rea Reser R, NP   ivermectin (STROMECTOL) 3 MG TABS tablet TAKE THREE TABLETS BY MOUTH ON AN EMPTY STOMACH. REPEAT IN ONE WEEK. WASH BEDDING, TOWELS, WORN CLOTHES IN HOT WATER AFTER EACH DOSE 6  tablet Hussain Maimone R, NP    valsartan  (DIOVAN ) 320 MG tablet  (Status: Discontinued) Take 1 tablet (320 mg total) by mouth daily. 30 tablet Marlaya Turck R, NP   hydrALAZINE  (APRESOLINE ) 25 MG tablet Take 1 tablet (25 mg total) by mouth 3 (three) times daily as needed (for pressure >150). 90 tablet Antwaun Buth R, NP   pantoprazole  (PROTONIX ) 40 MG tablet Take 1 tablet (40 mg total) by mouth daily. 30 tablet Aarvi Stotts R, NP   sucralfate  (CARAFATE ) 1 g tablet Take 1 tablet (1 g total) by mouth 4 (four) times daily -  with meals and at bedtime. 120 tablet Delson Dulworth R, NP   valsartan  (DIOVAN ) 320 MG tablet Take 1 tablet (320 mg total) by mouth daily. 30 tablet Terrilynn Postell R, NP   hydrOXYzine  (ATARAX ) 25 MG tablet Take 1 tablet (25 mg total) by mouth every 6 (six) hours. 12 tablet [place] R, NP      PDMP not reviewed this encounter.   [name], NP 07/03/23 1651

## 2023-07-04 ENCOUNTER — Emergency Department

## 2023-07-04 ENCOUNTER — Other Ambulatory Visit: Payer: Self-pay

## 2023-07-04 ENCOUNTER — Inpatient Hospital Stay
Admission: EM | Admit: 2023-07-04 | Discharge: 2023-07-09 | DRG: 392 | Disposition: A | Discharge status: Home / Self Care | Attending: Student in an Organized Health Care Education/Training Program | Admitting: Student in an Organized Health Care Education/Training Program

## 2023-07-04 DIAGNOSIS — Z882 Allergy status to sulfonamides status: Secondary | ICD-10-CM

## 2023-07-04 DIAGNOSIS — Z85828 Personal history of other malignant neoplasm of skin: Secondary | ICD-10-CM | POA: Diagnosis not present

## 2023-07-04 DIAGNOSIS — R109 Unspecified abdominal pain: Secondary | ICD-10-CM | POA: Diagnosis present

## 2023-07-04 DIAGNOSIS — E871 Hypo-osmolality and hyponatremia: Secondary | ICD-10-CM | POA: Diagnosis not present

## 2023-07-04 DIAGNOSIS — K580 Irritable bowel syndrome with diarrhea: Secondary | ICD-10-CM | POA: Diagnosis present

## 2023-07-04 DIAGNOSIS — Z818 Family history of other mental and behavioral disorders: Secondary | ICD-10-CM

## 2023-07-04 DIAGNOSIS — I10 Essential (primary) hypertension: Secondary | ICD-10-CM | POA: Diagnosis present

## 2023-07-04 DIAGNOSIS — F419 Anxiety disorder, unspecified: Secondary | ICD-10-CM | POA: Diagnosis present

## 2023-07-04 DIAGNOSIS — Z888 Allergy status to other drugs, medicaments and biological substances status: Secondary | ICD-10-CM | POA: Diagnosis not present

## 2023-07-04 DIAGNOSIS — Z91010 Allergy to peanuts: Secondary | ICD-10-CM | POA: Diagnosis not present

## 2023-07-04 DIAGNOSIS — K5732 Diverticulitis of large intestine without perforation or abscess without bleeding: Secondary | ICD-10-CM | POA: Diagnosis present

## 2023-07-04 DIAGNOSIS — Z9109 Other allergy status, other than to drugs and biological substances: Secondary | ICD-10-CM

## 2023-07-04 DIAGNOSIS — Z886 Allergy status to analgesic agent status: Secondary | ICD-10-CM

## 2023-07-04 DIAGNOSIS — R634 Abnormal weight loss: Secondary | ICD-10-CM | POA: Diagnosis present

## 2023-07-04 DIAGNOSIS — J4489 Other specified chronic obstructive pulmonary disease: Secondary | ICD-10-CM | POA: Diagnosis present

## 2023-07-04 DIAGNOSIS — Z885 Allergy status to narcotic agent status: Secondary | ICD-10-CM

## 2023-07-04 DIAGNOSIS — F603 Borderline personality disorder: Secondary | ICD-10-CM | POA: Diagnosis present

## 2023-07-04 DIAGNOSIS — F319 Bipolar disorder, unspecified: Secondary | ICD-10-CM | POA: Diagnosis present

## 2023-07-04 DIAGNOSIS — Z822 Family history of deafness and hearing loss: Secondary | ICD-10-CM

## 2023-07-04 DIAGNOSIS — Z823 Family history of stroke: Secondary | ICD-10-CM

## 2023-07-04 DIAGNOSIS — Z1152 Encounter for screening for COVID-19: Secondary | ICD-10-CM

## 2023-07-04 DIAGNOSIS — Z9151 Personal history of suicidal behavior: Secondary | ICD-10-CM

## 2023-07-04 DIAGNOSIS — Z881 Allergy status to other antibiotic agents status: Secondary | ICD-10-CM | POA: Diagnosis not present

## 2023-07-04 DIAGNOSIS — Z79890 Hormone replacement therapy (postmenopausal): Secondary | ICD-10-CM

## 2023-07-04 DIAGNOSIS — Z5900 Homelessness unspecified: Secondary | ICD-10-CM

## 2023-07-04 DIAGNOSIS — Z5982 Transportation insecurity: Secondary | ICD-10-CM

## 2023-07-04 DIAGNOSIS — Z91030 Bee allergy status: Secondary | ICD-10-CM

## 2023-07-04 DIAGNOSIS — F22 Delusional disorders: Secondary | ICD-10-CM | POA: Diagnosis present

## 2023-07-04 DIAGNOSIS — Z91198 Patient's noncompliance with other medical treatment and regimen for other reason: Secondary | ICD-10-CM

## 2023-07-04 DIAGNOSIS — Z880 Allergy status to penicillin: Secondary | ICD-10-CM

## 2023-07-04 DIAGNOSIS — M109 Gout, unspecified: Secondary | ICD-10-CM | POA: Diagnosis present

## 2023-07-04 DIAGNOSIS — E039 Hypothyroidism, unspecified: Secondary | ICD-10-CM | POA: Diagnosis present

## 2023-07-04 DIAGNOSIS — E876 Hypokalemia: Secondary | ICD-10-CM | POA: Diagnosis not present

## 2023-07-04 DIAGNOSIS — Z821 Family history of blindness and visual loss: Secondary | ICD-10-CM

## 2023-07-04 DIAGNOSIS — Z8249 Family history of ischemic heart disease and other diseases of the circulatory system: Secondary | ICD-10-CM

## 2023-07-04 DIAGNOSIS — K449 Diaphragmatic hernia without obstruction or gangrene: Secondary | ICD-10-CM | POA: Diagnosis present

## 2023-07-04 DIAGNOSIS — Z87891 Personal history of nicotine dependence: Secondary | ICD-10-CM | POA: Diagnosis not present

## 2023-07-04 DIAGNOSIS — Z604 Social exclusion and rejection: Secondary | ICD-10-CM | POA: Diagnosis present

## 2023-07-04 DIAGNOSIS — Z8673 Personal history of transient ischemic attack (TIA), and cerebral infarction without residual deficits: Secondary | ICD-10-CM

## 2023-07-04 DIAGNOSIS — Z5941 Food insecurity: Secondary | ICD-10-CM

## 2023-07-04 DIAGNOSIS — Z825 Family history of asthma and other chronic lower respiratory diseases: Secondary | ICD-10-CM

## 2023-07-04 DIAGNOSIS — Z765 Malingerer [conscious simulation]: Secondary | ICD-10-CM

## 2023-07-04 DIAGNOSIS — K5792 Diverticulitis of intestine, part unspecified, without perforation or abscess without bleeding: Secondary | ICD-10-CM | POA: Diagnosis present

## 2023-07-04 DIAGNOSIS — Z5986 Financial insecurity: Secondary | ICD-10-CM

## 2023-07-04 DIAGNOSIS — F4310 Post-traumatic stress disorder, unspecified: Secondary | ICD-10-CM | POA: Diagnosis present

## 2023-07-04 DIAGNOSIS — Z8261 Family history of arthritis: Secondary | ICD-10-CM

## 2023-07-04 DIAGNOSIS — Z79899 Other long term (current) drug therapy: Secondary | ICD-10-CM | POA: Diagnosis not present

## 2023-07-04 DIAGNOSIS — Z6825 Body mass index (BMI) 25.0-25.9, adult: Secondary | ICD-10-CM

## 2023-07-04 LAB — CBC WITH DIFFERENTIAL/PLATELET
Abs Immature Granulocytes: 0.03 10*3/uL (ref 0.00–0.07)
Basophils Absolute: 0.1 10*3/uL (ref 0.0–0.1)
Basophils Relative: 1 %
Eosinophils Absolute: 0.2 10*3/uL (ref 0.0–0.5)
Eosinophils Relative: 2 %
HCT: 33.3 % — ABNORMAL LOW (ref 36.0–46.0)
Hemoglobin: 11 g/dL — ABNORMAL LOW (ref 12.0–15.0)
Immature Granulocytes: 0 %
Lymphocytes Relative: 13 %
Lymphs Abs: 1.3 10*3/uL (ref 0.7–4.0)
MCH: 30.4 pg (ref 26.0–34.0)
MCHC: 33 g/dL (ref 30.0–36.0)
MCV: 92 fL (ref 80.0–100.0)
Monocytes Absolute: 1 10*3/uL (ref 0.1–1.0)
Monocytes Relative: 10 %
Neutro Abs: 7.6 10*3/uL (ref 1.7–7.7)
Neutrophils Relative %: 74 %
Platelets: 257 10*3/uL (ref 150–400)
RBC: 3.62 MIL/uL — ABNORMAL LOW (ref 3.87–5.11)
RDW: 13.9 % (ref 11.5–15.5)
WBC: 10.1 10*3/uL (ref 4.0–10.5)
nRBC: 0 % (ref 0.0–0.2)

## 2023-07-04 LAB — TROPONIN I (HIGH SENSITIVITY): Troponin I (High Sensitivity): 4 ng/L (ref <18)

## 2023-07-04 LAB — URINALYSIS, W/ REFLEX TO CULTURE (INFECTION SUSPECTED)
Bacteria, UA: NONE SEEN
Bilirubin Urine: NEGATIVE
Glucose, UA: NEGATIVE mg/dL
Hgb urine dipstick: NEGATIVE
Ketones, ur: NEGATIVE mg/dL
Leukocytes,Ua: NEGATIVE
Nitrite: NEGATIVE
Protein, ur: NEGATIVE mg/dL
RBC / HPF: 0 RBC/hpf (ref 0–5)
Specific Gravity, Urine: 1.005 (ref 1.005–1.030)
Squamous Epithelial / HPF: 0 /HPF (ref 0–5)
pH: 7 (ref 5.0–8.0)

## 2023-07-04 LAB — COMPREHENSIVE METABOLIC PANEL WITH GFR
ALT: 15 U/L (ref 0–44)
AST: 22 U/L (ref 15–41)
Albumin: 3.6 g/dL (ref 3.5–5.0)
Alkaline Phosphatase: 78 U/L (ref 38–126)
Anion gap: 9 (ref 5–15)
BUN: 11 mg/dL (ref 8–23)
CO2: 23 mmol/L (ref 22–32)
Calcium: 8.8 mg/dL — ABNORMAL LOW (ref 8.9–10.3)
Chloride: 102 mmol/L (ref 98–111)
Creatinine, Ser: 0.72 mg/dL (ref 0.44–1.00)
GFR, Estimated: >60 mL/min (ref >60)
Glucose, Bld: 107 mg/dL — ABNORMAL HIGH (ref 70–99)
Potassium: 3.6 mmol/L (ref 3.5–5.1)
Sodium: 134 mmol/L — ABNORMAL LOW (ref 135–145)
Total Bilirubin: 0.6 mg/dL (ref 0.0–1.2)
Total Protein: 6.8 g/dL (ref 6.5–8.1)

## 2023-07-04 LAB — MAGNESIUM: Magnesium: 2.1 mg/dL (ref 1.7–2.4)

## 2023-07-04 LAB — PROTIME-INR
INR: 0.9 (ref 0.8–1.2)
Prothrombin Time: 12 s (ref 11.4–15.2)

## 2023-07-04 LAB — RESP PANEL BY RT-PCR (RSV, FLU A&B, COVID)  RVPGX2
Influenza A by PCR: NEGATIVE
Influenza B by PCR: NEGATIVE
Resp Syncytial Virus by PCR: NEGATIVE
SARS Coronavirus 2 by RT PCR: NEGATIVE

## 2023-07-04 LAB — LACTIC ACID, PLASMA: Lactic Acid, Venous: 0.7 mmol/L (ref 0.5–1.9)

## 2023-07-04 MED ORDER — ONDANSETRON HCL 4 MG/2ML IJ SOLN
4.0000 mg | Freq: Once | INTRAMUSCULAR | Status: AC
  Administered 2023-07-04: 4 mg via INTRAVENOUS
  Filled 2023-07-04: qty 2

## 2023-07-04 MED ORDER — POLYETHYLENE GLYCOL 3350 17 G PO PACK
17.0000 g | PACK | Freq: Every day | ORAL | Status: DC
  Filled 2023-07-04 (×3): qty 1

## 2023-07-04 MED ORDER — BISACODYL 5 MG PO TBEC
5.0000 mg | DELAYED_RELEASE_TABLET | Freq: Every day | ORAL | Status: DC | PRN
  Filled 2023-07-04: qty 1

## 2023-07-04 MED ORDER — ENOXAPARIN SODIUM 40 MG/0.4ML IJ SOSY
40.0000 mg | PREFILLED_SYRINGE | INTRAMUSCULAR | Status: DC
  Administered 2023-07-04 – 2023-07-08 (×4): 40 mg via SUBCUTANEOUS
  Filled 2023-07-04 (×5): qty 0.4

## 2023-07-04 MED ORDER — SODIUM CHLORIDE 0.9 % IV SOLN
1.0000 g | Freq: Once | INTRAVENOUS | Status: AC
  Administered 2023-07-04: 1 g via INTRAVENOUS
  Filled 2023-07-04: qty 20

## 2023-07-04 MED ORDER — IOHEXOL 300 MG/ML  SOLN
100.0000 mL | Freq: Once | INTRAMUSCULAR | Status: AC | PRN
Start: 2023-07-04 — End: 2023-07-04
  Administered 2023-07-04: 100 mL via INTRAVENOUS

## 2023-07-04 MED ORDER — POTASSIUM CHLORIDE 20 MEQ PO PACK
20.0000 meq | PACK | Freq: Once | ORAL | Status: AC
  Administered 2023-07-04: 20 meq via ORAL
  Filled 2023-07-04: qty 1

## 2023-07-04 MED ORDER — ALUM & MAG HYDROXIDE-SIMETH 200-200-20 MG/5ML PO SUSP
30.0000 mL | ORAL | Status: DC | PRN
  Administered 2023-07-04 – 2023-07-07 (×4): 30 mL via ORAL
  Filled 2023-07-04 (×4): qty 30

## 2023-07-04 MED ORDER — SODIUM CHLORIDE 0.9 % IV SOLN
1.0000 g | Freq: Two times a day (BID) | INTRAVENOUS | Status: DC
  Filled 2023-07-04: qty 20

## 2023-07-04 MED ORDER — SODIUM CHLORIDE 0.9 % IV BOLUS
1000.0000 mL | Freq: Once | INTRAVENOUS | Status: AC
  Administered 2023-07-04: 1000 mL via INTRAVENOUS

## 2023-07-04 MED ORDER — ONDANSETRON HCL 4 MG/2ML IJ SOLN: 4.0000 mg | Freq: Four times a day (QID) | INTRAMUSCULAR | Status: DC | PRN

## 2023-07-04 MED ORDER — ONDANSETRON HCL 4 MG PO TABS: 4.0000 mg | ORAL_TABLET | Freq: Four times a day (QID) | ORAL | Status: DC | PRN

## 2023-07-04 MED ORDER — MORPHINE SULFATE (PF) 2 MG/ML IV SOLN
1.0000 mg | INTRAVENOUS | Status: AC | PRN
  Administered 2023-07-04 – 2023-07-05 (×4): 1 mg via INTRAVENOUS
  Filled 2023-07-04 (×5): qty 1

## 2023-07-04 MED ORDER — BUDESONIDE 0.25 MG/2ML IN SUSP
0.2500 mg | Freq: Two times a day (BID) | RESPIRATORY_TRACT | Status: AC
  Filled 2023-07-04 (×3): qty 2

## 2023-07-04 MED ORDER — FLUTICASONE PROPIONATE HFA 44 MCG/ACT IN AERO: 1.0000 | INHALATION_SPRAY | Freq: Two times a day (BID) | RESPIRATORY_TRACT | Status: DC

## 2023-07-04 NOTE — H&P (Addendum)
 History and Physical  Angela [name] UEA:[id number] DOB: [birth year] DOA: 07/04/2023 PCP: Pcp, No  Chief Complaint: Abdominal pain Historian: patient  HPI:  Angela [name] is a 73 y.o. female with a PMH significant for diverticulitis, endometriosis, inguinal hernias, TIA, GERD, hiatal hernia, anemia, HTN, asthma, PTSD, vitamin D  deficiency, arthritis, depression, hypothyroidism, housing insecurity. At baseline, they are homeless and are independent with their ADLs.  They presented to the ED on 07/04/2023 with abdominal pain which she describes as constant for the past year.  She states that she has been seen by several emergency room's but has never had a CAT scan before.  She is concerned because she feels that she has lost a lot of weight in the past year due to poor appetite and intermittent nausea and vomiting.  She has tried several over-the-counter treatments as well as lidocaine  which she has received in emergency room visits in the past.  She states that she has a remote history of diverticulitis.  Her last bowel movement was last night and was watery.  She is unable to confirm if there was blood in the stool or not.   Pain is primarily located in right lower quadrant.  She states that she is post appendectomy, cholecystectomy, bilateral inguinal hernia repair and a procedure to tack up her uterus.  She also endorses having a severe hiatal hernia and thinks that she has an ulcer.  She also describes shortness of breath and unable to further describe. Of note, although patient is able to provide clear answers to several questions, she also demonstrates easy distraction and flight of ideas which make it difficult to appreciate the true duration and severity of her current situation.  Further details of her current presentation are collected through collateral data including report and chart review.  It appears that she presented to the urgent care yesterday and had a temperature of 100.6  and was prescribed azithromycin  and ivermectin for a rash on her back.  The rash is resolved as of today and she thinks it was due to a location she was sleeping having fleas.  I am unable to confirm what medications she takes regularly or her current chronic conditions including behavioral health.  In the ED, it was found that they had stable vital signs.  Significant findings included: LA 0.7, Na+ 134, K+ 3.6, Mg++ 2.1, glucose 107, creatinine 0.72.  Normal hepatic levels.  WBC 10.1, hemoglobin 11 and otherwise normal CBC.  Blood culture was collected.  Urinalysis not acutely positive for infection. CT abdomen pelvis: Acute, uncomplicated sigmoid diverticulitis without perforation or abscess.  Confirms large hiatal hernia. CT head negative for acute abnormalities.  Does show mild to moderate cerebral volume loss Chest x-ray: No acute intrathoracic process  They were initially treated with Zofran , meropenem, IV fluids.   Patient was admitted to medicine service for further workup and management of diverticulitis as outlined in detail below.  Assessment/Plan Principal Problem:   Diverticulitis   Acute sigmoid diverticulitis: As seen on abdominal CT without complication.  Patient has significant list of allergies to medications precluding most first-line therapies. - Meropenem started in ED and will consult pharmacy to continue this treatment - Analgesia as needed - Fluid diet and advance as tolerated - MIVF  Large hiatal hernia-describes significant weight loss over the past year - Consider general surgery consult for fundoplication evaluation - Antiemetics as needed, Carafate , PPI  Housing insecurity-patient would benefit from [facility] assistance  COPD-endorses nonspecific shortness of breath.  No  hypoxia noted - Continue as needed inhalers - Flovent  H/o hypokalemia. Within normal range but on lower end and patient requested potassium supplement - 20mEq oral ordered x1  Patient is  unable to confirm her medications with me directly and I am awaiting verification from pharmacy tech with medication reconciliation prior to considering restarting of her home medications  Past Medical History:  Diagnosis Date   Agitation 09/18/2017   Allergic rhinitis    Altered mental status, unspecified 12/25/2018   Anemia    Blurred vision    Chest pain of uncertain etiology 05/31/2018   Confusion state 12/25/2018   COPD (chronic obstructive pulmonary disease) (HCC)    Depression    Diverticulosis    Endometriosis    Falls    GERD (gastroesophageal reflux disease)    Hernia, hiatal    Hypothyroidism    IBS (irritable bowel syndrome)    Labile mood    Malignant neoplasm of skin    Malnutrition of mild degree (HCC) 08/08/2018   Migraine    Neuropathy    Noncompliance 12/25/2017   PTSD (post-traumatic stress disorder)    SIADH (syndrome of inappropriate ADH production) (HCC) 09/08/2016   Thyroid  disease     Past Surgical History:  Procedure Laterality Date   APPENDECTOMY     CHOLECYSTECTOMY     CHOLECYSTECTOMY, LAPAROSCOPIC     ESOPHAGOGASTRODUODENOSCOPY (EGD) WITH PROPOFOL  N/A 03/29/2015   Procedure: ESOPHAGOGASTRODUODENOSCOPY (EGD) WITH PROPOFOL ;  Surgeon: Luella Sager, MD;  Location: [facility] ENDOSCOPY;  Service: Endoscopy;  Laterality: N/A;   HERNIA REPAIR     laproscopy     TONSILLECTOMY     uteral suspension       reports that she quit smoking about 46 years ago. Her smoking use included cigarettes. She has never used smokeless tobacco. She reports that she does not currently use alcohol . She reports that she does not use drugs.  Allergies  Allergen Reactions   Amoxicillin -Pot Clavulanate Anaphylaxis and Hives   Aspirin Hives   Bee Venom Hives    Spider bites cause hives Spider bites cause hives   Cephalexin  Hives and Other (See Comments)    Other reaction(s): Unknown    Ciprofloxacin  Anaphylaxis and Swelling   Fluticasone-Salmeterol Shortness Of Breath    Haloperidol  Nausea And Vomiting, Anaphylaxis and Other (See Comments)   Molds & Smuts Shortness Of Breath and Itching    Eyes-itchy Eyes-itchy Eyes-itchy Eyes-itchy Eyes-itchy    Neomycin-Polymyxin-Dexameth Swelling    Eye swelling   Other Hives, Rash, Anaphylaxis, Itching and Swelling    Spider bites cause hives Hives/throat swelling Hives/throat swelling   Oxycodone-Acetaminophen  Nausea And Vomiting, Other (See Comments) and Anxiety    hallucinations  hallucinations hallucinations hallucinations    Peanut Butter Flavoring Agent (Non-Screening) Anaphylaxis   Peanuts [Peanut Oil] Anaphylaxis and Hives   Penicillin G Anaphylaxis   Penicillins Hives and Anaphylaxis    Has patient had a PCN reaction causing immediate rash, facial/tongue/throat swelling, SOB or lightheadedness with hypotension: Unknown Has patient had a PCN reaction causing severe rash involving mucus membranes or skin necrosis: Unknown Has patient had a PCN reaction that required hospitalization: Unknown Has patient had a PCN reaction occurring within the last 10 years: Unknown If all of the above answers are NO, then may proceed with Cephalosporin use. Has patient had a PCN reaction causing immediate rash, facial/tongue/throat swelling, SOB or lightheadedness with hypotension: Unknown Has patient had a PCN reaction causing severe rash involving mucus membranes or skin necrosis: Unknown Has  patient had a PCN reaction that required hospitalization: Unknown Has patient had a PCN reaction occurring within the last 10 years: Unknown If all of the above answers are NO, then may proceed with Cephalosporin use. Has patient had a PCN reaction causing immediate rash, facial/tongue/throat swelling, SOB or lightheadedness with hypotension: Unknown Has patient had a PCN reaction causing severe rash involving mucus membranes or skin necrosis: Unknown Has patient had a PCN reaction that required hospitalization: Unknown Has  patient had a PCN reaction occurring within the last 10 years: Unknown If all of the above answers are NO, then may proceed with Cephalosporin use.   Propoxyphene Anaphylaxis and Hives    Hives/throat swelling  Hives/throat swelling Hives/throat swelling Hives/throat swelling Hives/throat swelling    Sulfa Antibiotics Anaphylaxis, Hives and Itching   Sulfacetamide Sodium Anaphylaxis, Hives and Itching   Tramadol Hives and Other (See Comments)    Other reaction(s): Unknown   Amikacin Nausea And Vomiting and Other (See Comments)   Bee Pollen Other (See Comments)    Other reaction(s): Unknown   Imipramine Hives    Reaction to Tofranil Reaction to Tofranil Reaction to Tofranil   Prednisone Nausea And Vomiting, Anxiety and Other (See Comments)    Crazy mood swings, strange thoughts. Tolerates depomedrol   Crazy mood swings, strange thoughts Other reaction(s): Other (See Comments) Crazy mood swings, strange thoughts. Tolerates depomedrol  Other reaction(s): Other (See Comments) Crazy mood swings, strange thoughts (pt does tolerate cortisone injections) Crazy mood swings, strange thoughts. Tolerates depomedrol  Crazy mood swings, strange thoughts   Sulfur Hives   Azithromycin  Other (See Comments)    Itching Itching Itching Bloody stool and high BP Bloody stool and high BP    Colchicine  Other (See Comments)    Reports that it interacts badly with my paxil . Unable to describe exactly what reaction is.   Elemental Sulfur Hives   Fluticasone    Hymenoptera Venom Preparations Hives    Reaction to spider bites   Lactose Intolerance (Gi)    Oxycodone Other (See Comments)   Peanut-Containing Drug Products    Phthalylsulfacetamide    Pregabalin Other (See Comments)   Risperidone Other (See Comments)    Serotonin syndrome Other reaction(s): Unknown Serotonin syndrome Serotonin syndrome Serotonin syndrome Serotonin syndrome    Sulfacetamide    Wound Dressing Adhesive     Adhesive [Tape] Hives and Rash   Albuterol  Palpitations    Tolerates levalbuterol  Tolerates levalbuterol  Tolerates albuterol  inhaler in small dosages.   Hydroxyzine  Itching and Anxiety    Vistaril  per pt    Family History  Problem Relation Age of Onset   Heart disease Father    Hearing loss Father    COPD Father    Depression Father    Stroke Father    Vision loss Father    Varicose Veins Father    Cancer Mother        lung   Depression Sister    Arthritis Sister    Arthritis Brother    Hernia Brother    Anxiety disorder Brother    Arthritis Brother    Hernia Brother    Anxiety disorder Brother    Urolithiasis Neg Hx    Kidney disease Neg Hx    Kidney cancer Neg Hx    Prostate cancer Neg Hx    Breast cancer Neg Hx     Prior to Admission medications   Medication Sig Start Date End Date Taking? Authorizing Provider  acetaminophen  (TYLENOL ) 650 MG CR tablet  Take 650 mg by mouth every 8 (eight) hours as needed for pain.    [provider]  Acetylcysteine (NAC) 500 MG CAPS Take 500 mg by mouth 2 (two) times a day.    [provider]  albuterol  (VENTOLIN  HFA) 108 (90 Base) MCG/ACT inhaler albuterol  sulfate HFA 90 mcg/actuation aerosol inhaler  INHALE 2 PUFFS BY MOUTH EVERY 4 HOURS AS NEEDED 07/03/23   White, Adrienne R, NP  allopurinol  (ZYLOPRIM ) 100 MG tablet Take 1 tablet (100 mg total) by mouth daily. 04/25/23   Buell Carmin, MD  allopurinol  (ZYLOPRIM ) 100 MG tablet Take 1 tablet (100 mg total) by mouth daily. 07/03/23 10/01/23  [name], NP  amLODipine  (NORVASC ) 5 MG tablet Take 1 tablet (5 mg total) by mouth daily. 05/07/23 05/06/24  Kandee Orion, MD  ascorbic acid  (VITAMIN C ) 1000 MG tablet Take 2,000 mg by mouth daily.    [provider]  azithromycin  (ZITHROMAX ) 250 MG tablet Take 1 tablet (250 mg total) by mouth daily for 4 days. Take first 2 tablets together, then 1 every day until finished. 07/03/23 07/07/23  [name], NP   benzonatate  (TESSALON  PERLES) 100 MG capsule Take 1 capsule (100 mg total) by mouth 3 (three) times daily as needed for cough. 06/13/23   Lynnda Sas, MD  calcium  carbonate (OSCAL) 1500 (600 Ca) MG TABS tablet Calcium  600 mg calcium  (1,500 mg) tablet  Take 1 tablet 3 times a day by oral route.    [provider]  Calcium  Citrate (CITRACAL PO) Take 800 mg by mouth. Takes 2 (400mg ) tablets BID    [provider]  Cholecalciferol  100 MCG (4000 UT) CAPS Take 3 capsules by mouth.    [provider]  colchicine  0.6 MG tablet Take 2 tablets ( 1.2 mg ) then in 1 hour take 1 tablet ( 0.6 mg),  then take 1 tablet ( 0.6 mg ) daily for 6 days Patient not taking: Reported on 05/07/2023 01/15/23   [name], NP  Cyanocobalamin  (VITAMIN B-12) 1000 MCG SUBL Place 1,000 mcg under the tongue.    [provider]  diclofenac  Sodium (VOLTAREN ) 1 % GEL Apply 4 g topically 4 (four) times daily. 01/31/23   McDonald, Olive Better, DPM  dicyclomine  (BENTYL ) 20 MG tablet Take 20 mg by mouth every 6 (six) hours.    [provider]  doxycycline  (VIBRA -TABS) 100 MG tablet Take 100 mg by mouth 2 (two) times daily. 02/13/23   [provider]  EPINEPHrine  0.3 mg/0.3 mL IJ SOAJ injection Inject into the skin. 03/01/22   [provider]  estradiol  (ESTRACE ) 0.1 MG/GM vaginal cream Place 1 Applicatorful vaginally daily.    [provider]  famotidine  (PEPCID ) 20 MG tablet Take 1 tablet (20 mg total) by mouth 2 (two) times daily as needed for heartburn or indigestion. 11/26/18   Thersia Flax, MD  famotidine  (PEPCID ) 40 MG tablet TAKE 1 TABLET AT BEDTIME ORALLY ONCE A DAY 90 DAYS Patient not taking: Reported on 05/06/2023    [provider]  ferrous sulfate  325 (65 FE) MG tablet Take 1 tablet (325 mg total) by mouth daily with breakfast. Patient not taking: Reported on 05/06/2023 04/29/19   Sheron Dixons, MD  fluconazole  (DIFLUCAN ) 150 MG tablet Take  1 tablet (150 mg total) by mouth every three (3) days as needed for up to 2 doses. 07/03/23   [name], NP  hydrALAZINE  (APRESOLINE ) 25 MG tablet Take 1 tablet (  25 mg total) by mouth 3 (three) times daily as needed (for pressure >150). 07/03/23 10/01/23  [name], NP  HYDROcodone -acetaminophen  (NORCO/VICODIN) 5-325 MG tablet Take 1 tablet by mouth every 6 (six) hours as needed. 04/19/20   [provider]  hydrOXYzine  (ATARAX ) 25 MG tablet Take 1 tablet (25 mg total) by mouth every 6 (six) hours. 07/03/23   White, Maybelle Spatz, NP  hydrOXYzine  (ATARAX /VISTARIL ) 10 MG tablet TAKE 1 TABLET BY MOUTH EVERY 8 HOURS AS NEEDED Patient taking differently: Take 25 mg by mouth every 4 (four) hours as needed. 06/04/19   Sheron Dixons, MD  Icosapent  Ethyl (VASCEPA ) 1 g CAPS TAKE 2 CAPSULES BY MOUTH TWICE DAILY (TO HELP WITH TRIGLYCERIDES) Patient not taking: Reported on 05/06/2023 11/26/18   Thersia Flax, MD  ivermectin (STROMECTOL) 3 MG TABS tablet TAKE THREE TABLETS BY MOUTH ON AN EMPTY STOMACH. REPEAT IN ONE WEEK. WASH BEDDING, TOWELS, WORN CLOTHES IN HOT WATER AFTER EACH DOSE 07/03/23   [name], NP  ketoconazole (NIZORAL) 2 % cream  02/04/19   [provider]  Lactulose  20 GM/30ML SOLN 30 ml every 4 hours until constipation is relieved Patient not taking: Reported on 05/06/2023 04/29/19   Sheron Dixons, MD  levalbuterol  (XOPENEX ) 1.25 MG/3ML nebulizer solution Take 3 mLs by nebulization every 6 (six) hours as needed for wheezing.    [provider]  levothyroxine  (SYNTHROID ) 112 MCG tablet Take 1 tablet (112 mcg total) by mouth daily before breakfast. 04/25/23 07/24/23  Buell Carmin, MD  levothyroxine  (SYNTHROID ) 112 MCG tablet Take 1 tablet (112 mcg total) by mouth daily. 07/03/23 10/01/23  [name], NP  Lidocaine  4 % PTCH     [provider]  linaclotide  (LINZESS ) 290 MCG CAPS capsule Take 1 capsule (290 mcg total) by mouth daily before  breakfast. Patient not taking: Reported on 03/06/2023 12/26/17   Clapacs, Elida Grounds, MD  LORazepam  (ATIVAN ) 1 MG tablet Take 1 mg by mouth every 4 (four) hours as needed for sleep. 02/06/18   [provider]  losartan  (COZAAR ) 100 MG tablet Take 1 tablet (100 mg total) by mouth daily. 05/07/23 05/06/24  Kandee Orion, MD  Magnesium  500 MG TABS Take 500 mg by mouth every other day.    [provider]  mupirocin ointment (BACTROBAN) 2 % Apply 1 application  topically 3 (three) times daily. 02/04/19   [provider]  neomycin-bacitracin -polymyxin (NEOSPORIN) ointment Apply topically. Patient not taking: Reported on 03/06/2023 03/11/20   [provider]  Nutritional Supplements (ENSURE PLANT-BASED PROTEIN) LIQD Take 1 Units by mouth as needed. 06/14/23   Buell Carmin, MD  nystatin  (MYCOSTATIN /NYSTOP ) powder Apply 1 Application topically 3 (three) times daily. Patient not taking: Reported on 05/06/2023 01/15/23   [name], NP  ondansetron  (ZOFRAN -ODT) 4 MG disintegrating tablet Take 1 tablet (4 mg total) by mouth every 8 (eight) hours as needed for nausea or vomiting. 04/25/23   Buell Carmin, MD  pantoprazole  (PROTONIX ) 40 MG tablet Take 1 tablet (40 mg total) by mouth daily. 06/14/23 06/13/24  Buell Carmin, MD  pantoprazole  (PROTONIX ) 40 MG tablet Take 1 tablet (40 mg total) by mouth daily. 07/03/23   White, Maybelle Spatz, NP  PARoxetine  (PAXIL ) 20 MG tablet Take 1 tablet (20 mg total) by mouth daily. 04/25/23   Buell Carmin, MD  PARoxetine  (PAXIL ) 20 MG tablet Take 1 tablet (20 mg total) by mouth daily. 05/07/23 08/05/23  Kandee Orion, MD  polyethylene glycol-electrolytes (  NULYTELY ) 420 g solution     [provider]  prednisoLONE acetate (PRED FORTE) 1 % ophthalmic suspension  07/26/18   [provider]  promethazine  (PHENERGAN ) 12.5 MG tablet Take 12.5 mg by mouth every 6 (six) hours as needed for nausea or vomiting. Patient not taking: Reported on 03/06/2023     [provider]  pyridoxine  (B-6) 100 MG tablet Take 1 tablet (100 mg total) by mouth 2 (two) times daily. Patient taking differently: Take 500 mg by mouth in the morning and at bedtime. 12/26/17   Clapacs, Elida Grounds, MD  REFRESH 1.4-0.6 % SOLN Apply to eye. Patient not taking: Reported on 03/06/2023 03/17/20   [provider]  Senna-Natural Laxatives (SENOKOT LAXATIVE) Tea Bag MISC     [provider]  sucralfate  (CARAFATE ) 1 g tablet Take 1 tablet (1 g total) by mouth 4 (four) times daily -  with meals and at bedtime. 07/03/23 09/01/23  [name], NP  sucralfate  (CARAFATE ) 1 GM/10ML suspension Take 10 mLs (1 g total) by mouth 4 (four) times daily. 04/25/23 04/24/24  Buell Carmin, MD  terconazole  (TERAZOL 7 ) 0.4 % vaginal cream     [provider]  Tiotropium Bromide  Monohydrate 2.5 MCG/ACT AERS Inhale into the lungs. 04/22/22 02/19/24  [provider]  tiZANidine  (ZANAFLEX ) 2 MG tablet Take by mouth every 6 (six) hours as needed for muscle spasms. Patient not taking: Reported on 03/06/2023    [provider]  traZODone  (DESYREL ) 50 MG tablet Take 0.5 tablets (25 mg total) by mouth at bedtime. 04/25/23   Buell Carmin, MD  traZODone  (DESYREL ) 50 MG tablet Take 0.5 tablets (25 mg total) by mouth at bedtime. 05/07/23 08/05/23  Kandee Orion, MD  triamcinolone  ointment (KENALOG ) 0.5 % Apply 1 application topically 2 (two) times daily. Patient not taking: Reported on 03/06/2023 11/26/18   Thersia Flax, MD  umeclidinium bromide  (INCRUSE ELLIPTA ) 62.5 MCG/INH AEPB Inhale 1 puff into the lungs daily. Patient not taking: Reported on 03/06/2023 11/26/18   Thersia Flax, MD  valsartan  (DIOVAN ) 320 MG tablet Take 1 tablet (320 mg total) by mouth daily. 07/03/23 08/02/23  [name], NP  vitamin C  (ASCORBIC ACID ) 250 MG tablet Take 1 tablet (250 mg total) by mouth daily. 05/07/23   Kandee Orion, MD  Vitamin D , Ergocalciferol , (DRISDOL ) 1.25 MG (50000 UNIT)  CAPS capsule Take 1 capsule (50,000 Units total) by mouth every 7 (seven) days. Patient taking differently: Take 5,000 Units by mouth every 7 (seven) days. 05/19/19   Sheron Dixons, MD  XIIDRA 5 % SOLN Place 1 drop into both eyes 2 (two) times daily. 07/27/18   [provider]   I have personally, briefly reviewed patient's prior medical records in [facility]  Objective: Blood pressure (!) 153/71, pulse 67, temperature 98.4 F (36.9 C), temperature source Oral, resp. rate 18, height 5' 2 (1.575 m), weight 63 kg, SpO2 97%.   Constitutional: NAD, calm, comfortable HEENT: lids and conjunctivae normal. MMM. Posterior pharynx clear of any exudate or lesions. Normal dentition.  Neck: normal, supple, no masses, no thyromegaly Respiratory: CTAB, no wheezing, no crackles. Normal respiratory effort. No accessory muscle use.  Cardiovascular: RRR, no murmurs / rubs / gallops. No extremity edema. 2+ pedal pulses. no clubbing / cyanosis.  Abdomen: soft, ND, no masses or HSM palpated.  Several remote healed surgical scars.  Endorses tenderness to right lower quadrant Musculoskeletal: No joint deformity upper and lower extremities. Normal  muscle tone.  Skin: dry, intact, normal color, normal temperature on exposed skin Neurologic: Alert and oriented x 3. Normal speech. Grossly non-focal exam. PERRL Psychiatric: Appears mildly anxious.  Flight of ideas.  Easily distracted  Labs on Admission: I have personally reviewed admission labs and imaging studies  CBC    Component Value Date/Time   WBC 10.1 07/04/2023 1150   RBC 3.62 (L) 07/04/2023 1150   HGB 11.0 (L) 07/04/2023 1150   HGB 12.3 12/05/2016 1330   HCT 33.3 (L) 07/04/2023 1150   HCT 37.1 12/05/2016 1330   PLT 257 07/04/2023 1150   PLT 448 (H) 12/05/2016 1330   MCV 92.0 07/04/2023 1150   MCV 94 12/05/2016 1330   MCV 94 04/16/2014 0519   MCH 30.4 07/04/2023 1150   MCHC 33.0 07/04/2023 1150   RDW 13.9 07/04/2023 1150   RDW  14.0 12/05/2016 1330   RDW 13.1 04/16/2014 0519   LYMPHSABS 1.3 07/04/2023 1150   LYMPHSABS 1.6 12/05/2016 1330   LYMPHSABS 0.9 (L) 04/16/2014 0519   MONOABS 1.0 07/04/2023 1150   MONOABS 0.6 04/16/2014 0519   EOSABS 0.2 07/04/2023 1150   EOSABS 0.1 12/05/2016 1330   EOSABS 0.1 04/16/2014 0519   BASOSABS 0.1 07/04/2023 1150   BASOSABS 0.0 12/05/2016 1330   BASOSABS 0.0 04/16/2014 0519   CMP     Component Value Date/Time   NA 134 (L) 07/04/2023 1150   NA 136 (A) 06/23/2019 0000   NA 132 (L) 04/16/2014 0519   K 3.6 07/04/2023 1150   K 3.8 04/16/2014 0519   CL 102 07/04/2023 1150   CL 99 (L) 04/16/2014 0519   CO2 23 07/04/2023 1150   CO2 28 04/16/2014 0519   GLUCOSE 107 (H) 07/04/2023 1150   GLUCOSE 99 04/16/2014 0519   BUN 11 07/04/2023 1150   BUN 13 06/23/2019 0000   BUN 6 04/16/2014 0519   CREATININE 0.72 07/04/2023 1150   CREATININE 0.93 02/26/2018 1612   CALCIUM  8.8 (L) 07/04/2023 1150   CALCIUM  7.6 (L) 04/16/2014 0519   PROT 6.8 07/04/2023 1150   PROT 6.8 04/29/2019 1549   PROT 6.6 04/14/2014 1512   ALBUMIN 3.6 07/04/2023 1150   ALBUMIN 4.4 04/29/2019 1549   ALBUMIN 3.8 04/14/2014 1512   AST 22 07/04/2023 1150   AST 31 04/14/2014 1512   ALT 15 07/04/2023 1150   ALT 17 04/14/2014 1512   ALKPHOS 78 07/04/2023 1150   ALKPHOS 65 04/14/2014 1512   BILITOT 0.6 07/04/2023 1150   BILITOT <0.2 04/29/2019 1549   BILITOT <0.1 (L) 04/14/2014 1512   GFRNONAA >60 07/04/2023 1150   GFRNONAA 63 02/26/2018 1612   GFRAA 91 04/29/2019 1549   GFRAA 73 02/26/2018 1612    Radiological Exams on Admission: CT ABDOMEN PELVIS W CONTRAST Result Date: 07/04/2023 CLINICAL DATA:  Abdominal pain, fever and chills, lower back pain EXAM: CT ABDOMEN AND PELVIS WITH CONTRAST TECHNIQUE: Multidetector CT imaging of the abdomen and pelvis was performed using the standard protocol following bolus administration of intravenous contrast. RADIATION DOSE REDUCTION: This exam was performed according  to the departmental dose-optimization program which includes automated exposure control, adjustment of the mA and/or kV according to patient size and/or use of iterative reconstruction technique. CONTRAST:  OMNIPAQUE  IOHEXOL  300 MG/ML  SOLN COMPARISON:  10/15/2015 FINDINGS: Lower chest: Large hiatal hernia with the majority of the stomach in an intrathoracic location. No acute pleural or parenchymal lung disease. Hepatobiliary: No focal liver abnormality is seen. Status post cholecystectomy. No  biliary dilatation. Pancreas: Unremarkable. No pancreatic ductal dilatation or surrounding inflammatory changes. Spleen: Normal in size without focal abnormality. Adrenals/Urinary Tract: Kidneys enhance normally and symmetrically. No urinary tract calculi or obstructive uropathy within either kidney. Marked distension of the urinary bladder. No filling defects. Stomach/Bowel: No bowel obstruction or ileus. There is diverticulosis of the descending and sigmoid colon, with long segment mural thickening and pericolonic fat stranding within the mid sigmoid colon consistent with acute diverticulitis. No evidence of perforation, fluid collection, or abscess. Vascular/Lymphatic: Aortic atherosclerosis. No enlarged abdominal or pelvic lymph nodes. Reproductive: Stable 4.0 x 2.9 cm right adnexal cyst, previously evaluated by ultrasound and compatible with benign cyst. Uterus and left adnexa are unremarkable. Other: Trace pelvic free fluid. No free intraperitoneal gas. No abdominal wall hernia. Musculoskeletal: No acute or destructive bony abnormalities. Reconstructed images demonstrate no additional findings. IMPRESSION: 1. Acute uncomplicated sigmoid diverticulitis. No perforation, fluid collection, or abscess. 2. Marked distension of the urinary bladder. 3. Trace pelvic free fluid. 4. Large hiatal hernia, with the majority of the stomach in an intrathoracic location. 5.  Aortic Atherosclerosis (ICD10-I70.0). Electronically  Signed   By: Bobbye Burrow M.D.   On: 07/04/2023 15:34   CT HEAD WO CONTRAST ( ) Result Date: 07/04/2023 CLINICAL DATA:  Head trauma, minor. EXAM: CT HEAD WITHOUT CONTRAST TECHNIQUE: Contiguous axial images were obtained from the base of the skull through the vertex without intravenous contrast. RADIATION DOSE REDUCTION: This exam was performed according to the departmental dose-optimization program which includes automated exposure control, adjustment of the mA and/or kV according to patient size and/or use of iterative reconstruction technique. COMPARISON:  CT of the head dated May 05, 2023. FINDINGS: Brain: Mild-to-moderate age-related cerebral volume loss. Chronic lacunar infarct within the anterior limb of the right internal capsule. No evidence of acute intracranial injury. No evidence of hemorrhage, mass or hydrocephalus. Vascular: Vascular calcifications within the carotid siphons. Skull: Intact and unremarkable.  No lesions are present. Sinuses/Orbits: Normal. Other: None. IMPRESSION: 1. Mild to moderate cerebral volume loss and chronic lacunar infarct within the anterior limb of the right internal capsule. No apparent acute process. Electronically Signed   By: Maribeth Shivers M.D.   On: 07/04/2023 15:23   DG Chest Port 1 View Result Date: 07/04/2023 CLINICAL DATA:  Back pain, febrile, sepsis EXAM: PORTABLE CHEST 1 VIEW COMPARISON:  06/30/2021 FINDINGS: Single frontal view of the chest demonstrates a stable cardiac silhouette. Large hiatal hernia again noted. No acute airspace disease, effusion, or pneumothorax. There are no acute bony abnormalities. IMPRESSION: 1. No acute intrathoracic process.  Stable hiatal hernia. Electronically Signed   By: Bobbye Burrow M.D.   On: 07/04/2023 14:25   EKG: Independently reviewed.  Sinus bradycardia, heart rate 58  DVT prophylaxis: enoxaparin  (LOVENOX ) injection 40 mg Start: 07/04/23 1715   Code Status: Full Family Communication: None Disposition  Plan: Admit for IV antibiotic use Consults called: None  Ree Candy, DO [facility]  07/04/2023, 5:07 PM    To contact the appropriate [facility] Attending or Consulting provider: Check [email or web address] for coverage from 7pm-7am

## 2023-07-04 NOTE — ED Provider Notes (Signed)
 [facility] Provider Note    Event Date/Time   First MD Initiated Contact with Patient 07/04/23 1131     (approximate)   History   Back Pain and Fever   HPI  Angela [name] is a 73 y.o. female who is homeless, has a history of COPD, endometriosis, hypothyroidism, SIADH who comes in with concerns for fever, lower abdominal pain, back pain.  I reviewed an urgent care note from yesterday where patient did have a fever of 100.6.  And noted to have a rash on her back.  It was thought that these were unrelated patient treated with azithromycin  and ivermectin.  Patient reports that she did take Tylenol  around 8 AM before coming in.  She reports just not feeling well with continued body aches, right and left flank pain and burning when she urinates.  She also reports some lower abdominal pain.  Patient does have a little bit of stuttering when she speaks but she reports has been new since yesterday.   Physical Exam   Triage Vital Signs: ED Triage Vitals  Encounter Vitals Group     BP 07/04/23 1106 (!) 151/77     Girls Systolic BP Percentile --      Girls Diastolic BP Percentile --      Boys Systolic BP Percentile --      Boys Diastolic BP Percentile --      Pulse Rate 07/04/23 1106 64     Resp 07/04/23 1106 17     Temp 07/04/23 1106 98.4 F (36.9 C)     Temp Source 07/04/23 1106 Oral     SpO2 07/04/23 1106 98 %     Weight 07/04/23 1105 138 lb 14.2 oz (63 kg)     Height 07/04/23 1105 5' 2 (1.575 m)     Head Circumference --      Peak Flow --      Pain Score 07/04/23 1108 9     Pain Loc --      Pain Education --      Exclude from Growth Chart --     Most recent vital signs: Vitals:   07/04/23 1106  BP: (!) 151/77  Pulse: 64  Resp: 17  Temp: 98.4 F (36.9 C)  SpO2: 98%     General: Awake, no distress.  CV:  Good peripheral perfusion.  Resp:  Normal effort.  Abd:  No distention.  Tender in the lower abdomen Other:  No rash noted  positive CVA tenderness Cranial nerves are intact equal strength in arms and legs.  Patient able to stand up and ambulate she does have somewhat of a stuttering speech.  ED Results / Procedures / Treatments   Labs (all labs ordered are listed, but only abnormal results are displayed) Labs Reviewed  CULTURE, BLOOD (ROUTINE X 2)  CULTURE, BLOOD (ROUTINE X 2)  RESP PANEL BY RT-PCR (RSV, FLU A&B, COVID)  [id number]  LACTIC ACID, PLASMA  LACTIC ACID, PLASMA  COMPREHENSIVE METABOLIC PANEL WITH GFR  CBC WITH DIFFERENTIAL/PLATELET  PROTIME-INR  URINALYSIS, W/ REFLEX TO CULTURE (INFECTION SUSPECTED)  TROPONIN I (HIGH SENSITIVITY)     EKG  My interpretation of EKG:  Sinus bradycardia rate of 58 without any ST elevation or T wave inversions, normal intervals  RADIOLOGY I have reviewed the xray personally and interpreted no evidence of any pneumonia   PROCEDURES:  Critical Care performed: No  Procedures   MEDICATIONS ORDERED IN ED: Medications  iohexol  (OMNIPAQUE ) 300 MG/ML solution  100 mL (100 mLs Intravenous Contrast Given 07/04/23 1332)     IMPRESSION / MDM / ASSESSMENT AND PLAN / ED COURSE  I reviewed the triage vital signs and the nursing notes.   Patient's presentation is most consistent with acute presentation with potential threat to life or bodily function.   Patient comes in with fever yesterday with abdominal and back pain.  Will get CT imaging to rule out any type of diverticulitis, infected kidney stone, abscess, will check urine to evaluate for UTI.  Will also get CT head given she does report changes in her speech although does not really sound aphasic is just that she is stuttering however we will get CT head to ensure no evidence of intercranial hemorrhage or other acute pathology   UA normal UTI Trop negative Lactate normal CMP normal  CBC normal    Personally reviewed the CT imaging that does show diffuse bladder enlargement but patient did report that  she really had to use the bathroom and she did urinate over 1000 mL out.  Discussed with patient's and given her fever, age, discomfort will discuss with the hospital team for admission.  The patient would benefit from Child psychotherapist.  I also discussed the case with pharmacy given patient's multiple allergies we will trial on meropenem.  Will discuss with the hospitalist for admission   Clinical Course as of 07/04/23 1617  Wed Jul 04, 2023  1529 CT HEAD WO CONTRAST ( ) [MF]    Clinical Course User Index [MF] Lubertha Rush, MD     FINAL CLINICAL IMPRESSION(S) / ED DIAGNOSES   Final diagnoses:  Diverticulitis     Rx / DC Orders   ED Discharge Orders     None        Note:  This document was prepared using Dragon voice recognition software and may include unintentional dictation errors.   Lubertha Rush, MD 07/04/23 [phone]

## 2023-07-04 NOTE — ED Notes (Signed)
 Urine sent

## 2023-07-04 NOTE — ED Notes (Signed)
 Pt would like to wait until she gets to a room for blood work.

## 2023-07-04 NOTE — ED Triage Notes (Signed)
 Pt here with back pain. Pt states she is homeless and she has not eaten in a few days. Pt states her temperature was 100 yesterday along with chills. Pt also having abd pain. Pt c/o lower back pain.

## 2023-07-04 NOTE — ED Notes (Signed)
 Pt stuck for an Iv at this time. Able to obtain blood work from site but unable to keep IV in due to infiltration.

## 2023-07-04 NOTE — Consult Note (Signed)
 ED Pharmacy Antibiotic Sign Off An antibiotic consult was received from an ED provider for Meropenem per pharmacy dosing for Diverticulitis. A chart review was completed to assess appropriateness.   *Therapy discussed with provider. Patient with allergy documented to cipro , penicillin, ampicillin, and keflex   The following one time order(s) were placed:  Meropenem 1gm IV x 1 dose  Further antibiotic and/or antibiotic pharmacy consults should be ordered by the admitting provider if indicated.   Rayburn Mundis Rodriguez-Guzman PharmD, BCPS 07/04/2023 4:30 PM

## 2023-07-04 NOTE — ED Notes (Signed)
 Pt ambulatory to rm. Gait steady at this time. NAD noted.

## 2023-07-05 DIAGNOSIS — K5732 Diverticulitis of large intestine without perforation or abscess without bleeding: Secondary | ICD-10-CM

## 2023-07-05 DIAGNOSIS — K5792 Diverticulitis of intestine, part unspecified, without perforation or abscess without bleeding: Secondary | ICD-10-CM | POA: Diagnosis not present

## 2023-07-05 LAB — CREATININE, SERUM
Creatinine, Ser: 0.69 mg/dL (ref 0.44–1.00)
GFR, Estimated: >60 mL/min (ref >60)

## 2023-07-05 MED ORDER — AMLODIPINE BESYLATE 5 MG PO TABS
5.0000 mg | ORAL_TABLET | Freq: Every day | ORAL | Status: DC
  Administered 2023-07-05 – 2023-07-08 (×4): 5 mg via ORAL
  Filled 2023-07-05 (×6): qty 1

## 2023-07-05 MED ORDER — MORPHINE SULFATE (PF) 2 MG/ML IV SOLN
1.0000 mg | INTRAVENOUS | Status: AC | PRN
  Administered 2023-07-05 – 2023-07-07 (×4): 1 mg via INTRAVENOUS
  Filled 2023-07-05 (×4): qty 1

## 2023-07-05 MED ORDER — DICYCLOMINE HCL 10 MG PO CAPS
10.0000 mg | ORAL_CAPSULE | Freq: Three times a day (TID) | ORAL | Status: DC | PRN
  Administered 2023-07-05 – 2023-07-09 (×3): 10 mg via ORAL
  Filled 2023-07-05 (×3): qty 1

## 2023-07-05 MED ORDER — HYDROXYZINE HCL 10 MG PO TABS
10.0000 mg | ORAL_TABLET | Freq: Four times a day (QID) | ORAL | Status: DC | PRN
  Administered 2023-07-05 – 2023-07-07 (×3): 10 mg via ORAL
  Filled 2023-07-05 (×4): qty 1

## 2023-07-05 MED ORDER — SODIUM CHLORIDE 0.9 % IV SOLN
1.0000 g | Freq: Three times a day (TID) | INTRAVENOUS | Status: DC
  Administered 2023-07-05 – 2023-07-07 (×8): 1 g via INTRAVENOUS
  Filled 2023-07-05 (×10): qty 20

## 2023-07-05 MED ORDER — LEVOTHYROXINE SODIUM 112 MCG PO TABS
112.0000 ug | ORAL_TABLET | Freq: Every day | ORAL | Status: DC
  Administered 2023-07-06 – 2023-07-09 (×4): 112 ug via ORAL
  Filled 2023-07-05 (×4): qty 1

## 2023-07-05 MED ORDER — ALBUTEROL SULFATE (2.5 MG/3ML) 0.083% IN NEBU
2.5000 mg | INHALATION_SOLUTION | Freq: Four times a day (QID) | RESPIRATORY_TRACT | Status: DC | PRN
  Administered 2023-07-05 – 2023-07-08 (×4): 2.5 mg via RESPIRATORY_TRACT
  Filled 2023-07-05 (×4): qty 3

## 2023-07-05 MED ORDER — MUPIROCIN 2 % EX OINT: 1.0000 | TOPICAL_OINTMENT | Freq: Three times a day (TID) | CUTANEOUS | Status: DC

## 2023-07-05 MED ORDER — TRAZODONE HCL 50 MG PO TABS
25.0000 mg | ORAL_TABLET | Freq: Every day | ORAL | Status: DC
  Administered 2023-07-05 – 2023-07-08 (×3): 25 mg via ORAL
  Filled 2023-07-05 (×5): qty 1

## 2023-07-05 MED ORDER — ACETAMINOPHEN 325 MG PO TABS
650.0000 mg | ORAL_TABLET | Freq: Four times a day (QID) | ORAL | Status: DC | PRN
  Administered 2023-07-05: 650 mg via ORAL
  Filled 2023-07-05: qty 2

## 2023-07-05 MED ORDER — SUCRALFATE 1 GM/10ML PO SUSP
1.0000 g | Freq: Four times a day (QID) | ORAL | Status: DC
  Administered 2023-07-05 – 2023-07-09 (×9): 1 g via ORAL
  Filled 2023-07-05 (×12): qty 10

## 2023-07-05 MED ORDER — PAROXETINE HCL 10 MG PO TABS
10.0000 mg | ORAL_TABLET | Freq: Every day | ORAL | Status: DC
  Administered 2023-07-05 – 2023-07-09 (×5): 10 mg via ORAL
  Filled 2023-07-05 (×5): qty 1

## 2023-07-05 NOTE — Consult Note (Signed)
 Marnee Sink, MD [facility]  [address], [address] [place], [place] [address] Phone: [phone] Fax : [phone]  Consultation  Referring Provider:     Dr. Alva Jewels Primary Care Physician:  Pcp, No Primary Gastroenterologist: [facility]         Reason for Consultation:     Diverticulitis  Date of Admission:  07/04/2023 Date of Consultation:  07/05/2023         HPI:   Angela [name] is a 73 y.o. female who has been seen in the past by Dr. Felicita Horns myself and Dr. Is at [facility].  The patient has a longstanding history of irritable bowel syndrome with diarrhea and states that she has a history of colitis.  The patient has had issues with diverticulitis in the past and had an attack back in 2017.  The patient came in and had a CT scan showing uncomplicated diverticulitis and the patient was started on antibiotics.  The patient reports that she has been homeless for the last 8 months due to family issues and has not been able to seek any care for her GI problems.  The patient has been treated for her diverticulitis as an inpatient without any issues but stated she wanted to see a gastroenterologist while she was here.  Past Medical History:  Diagnosis Date   Agitation 09/18/2017   Allergic rhinitis    Altered mental status, unspecified 12/25/2018   Anemia    Blurred vision    Chest pain of uncertain etiology 05/31/2018   Confusion state 12/25/2018   COPD (chronic obstructive pulmonary disease) (HCC)    Depression    Diverticulosis    Endometriosis    Falls    GERD (gastroesophageal reflux disease)    Hernia, hiatal    Hypothyroidism    IBS (irritable bowel syndrome)    Labile mood    Malignant neoplasm of skin    Malnutrition of mild degree (HCC) 08/08/2018   Migraine    Neuropathy    Noncompliance 12/25/2017   PTSD (post-traumatic stress disorder)    SIADH (syndrome of inappropriate ADH production) (HCC) 09/08/2016   Thyroid  disease     Past Surgical History:  Procedure Laterality  Date   APPENDECTOMY     CHOLECYSTECTOMY     CHOLECYSTECTOMY, LAPAROSCOPIC     ESOPHAGOGASTRODUODENOSCOPY (EGD) WITH PROPOFOL  N/A 03/29/2015   Procedure: ESOPHAGOGASTRODUODENOSCOPY (EGD) WITH PROPOFOL ;  Surgeon: Luella Sager, MD;  Location: [facility] ENDOSCOPY;  Service: Endoscopy;  Laterality: N/A;   HERNIA REPAIR     laproscopy     TONSILLECTOMY     uteral suspension      Prior to Admission medications   Medication Sig Start Date End Date Taking? Authorizing Provider  Acetylcysteine (NAC) 500 MG CAPS Take 500 mg by mouth 2 (two) times a day.   Yes [provider]  albuterol  (VENTOLIN  HFA) 108 (90 Base) MCG/ACT inhaler albuterol  sulfate HFA 90 mcg/actuation aerosol inhaler  INHALE 2 PUFFS BY MOUTH EVERY 4 HOURS AS NEEDED 07/03/23  Yes White, Adrienne R, NP  allopurinol  (ZYLOPRIM ) 100 MG tablet Take 1 tablet (100 mg total) by mouth daily. 04/25/23  Yes Buell Carmin, MD  amLODipine  (NORVASC ) 5 MG tablet Take 1 tablet (5 mg total) by mouth daily. 05/07/23 05/06/24 Yes Kandee Orion, MD  ascorbic acid  (VITAMIN C ) 1000 MG tablet Take 2,000 mg by mouth daily.   Yes [provider]  benzonatate  (TESSALON  PERLES) 100 MG capsule Take 1 capsule (100 mg total) by mouth  3 (three) times daily as needed for cough. 06/13/23  Yes Lynnda Sas, MD  calcium  carbonate (OSCAL) 1500 (600 Ca) MG TABS tablet Calcium  600 mg calcium  (1,500 mg) tablet  Take 1 tablet 3 times a day by oral route.   Yes [provider]  Calcium  Citrate (CITRACAL PO) Take 800 mg by mouth. Takes 2 (400mg ) tablets BID   Yes [provider]  Cholecalciferol  100 MCG (4000 UT) CAPS Take 3 capsules by mouth.   Yes [provider]  colchicine  0.6 MG tablet Take 2 tablets ( 1.2 mg ) then in 1 hour take 1 tablet ( 0.6 mg),  then take 1 tablet ( 0.6 mg ) daily for 6 days 01/15/23  Yes White, Adrienne R, NP  Cyanocobalamin  (VITAMIN B-12) 1000 MCG SUBL Place 1,000 mcg under the tongue.   Yes [provider]  diclofenac  Sodium (VOLTAREN ) 1 % GEL Apply 4 g topically 4 (four) times daily. 01/31/23  Yes McDonald, Olive Better, DPM  dicyclomine  (BENTYL ) 20 MG tablet Take 20 mg by mouth every 6 (six) hours.   Yes [provider]  estradiol  (ESTRACE ) 0.1 MG/GM vaginal cream Place 1 Applicatorful vaginally daily.   Yes [provider]  famotidine  (PEPCID ) 20 MG tablet Take 1 tablet (20 mg total) by mouth 2 (two) times daily as needed for heartburn or indigestion. 11/26/18  Yes Thersia Flax, MD  hydrALAZINE  (APRESOLINE ) 25 MG tablet Take 1 tablet (25 mg total) by mouth 3 (three) times daily as needed (for pressure >150). 07/03/23 10/01/23 Yes White, Adrienne R, NP  hydrOXYzine  (ATARAX ) 25 MG tablet Take 1 tablet (25 mg total) by mouth every 6 (six) hours. 07/03/23  Yes White, Adrienne R, NP  hydrOXYzine  (ATARAX /VISTARIL ) 10 MG tablet TAKE 1 TABLET BY MOUTH EVERY 8 HOURS AS NEEDED Patient taking differently: Take 25 mg by mouth every 4 (four) hours as needed. 06/04/19  Yes Sheron Dixons, MD  levalbuterol  (XOPENEX ) 1.25 MG/3ML nebulizer solution Take 3 mLs by nebulization every 6 (six) hours as needed for wheezing.   Yes [provider]  Lidocaine  4 % PTCH    Yes [provider]  LORazepam  (ATIVAN ) 1 MG tablet Take 1 mg by mouth every 4 (four) hours as needed for sleep. 02/06/18  Yes [provider]  Magnesium  500 MG TABS Take 500 mg by mouth every other day.   Yes [provider]  pantoprazole  (PROTONIX ) 40 MG tablet Take 1 tablet (40 mg total) by mouth daily. 06/14/23 06/13/24 Yes Buell Carmin, MD  PARoxetine  (PAXIL ) 20 MG tablet Take 1 tablet (20 mg total) by mouth daily. 04/25/23  Yes Buell Carmin, MD  prednisoLONE acetate (PRED FORTE) 1 % ophthalmic suspension  07/26/18  Yes [provider]  pyridoxine  (B-6) 100 MG tablet Take 1 tablet (100 mg total) by mouth 2 (two) times daily. Patient taking differently: Take 500 mg by mouth in the morning  and at bedtime. 12/26/17  Yes Clapacs, Elida Grounds, MD  Senna-Natural Laxatives (SENOKOT LAXATIVE) Tea Bag MISC    Yes [provider]  sucralfate  (CARAFATE ) 1 GM/10ML suspension Take 10 mLs (1 g total) by mouth 4 (four) times daily. 04/25/23 04/24/24 Yes Buell Carmin, MD  terconazole  (TERAZOL 7 ) 0.4 % vaginal cream    Yes [provider]  Tiotropium Bromide  Monohydrate 2.5 MCG/ACT AERS Inhale into the lungs. 04/22/22 02/19/24 Yes [provider]  traZODone  (DESYREL ) 50 MG tablet Take 0.5 tablets (25 mg total) by mouth at bedtime.  04/25/23  Yes Buell Carmin, MD  valsartan  (DIOVAN ) 320 MG tablet Take 1 tablet (320 mg total) by mouth daily. 07/03/23 08/02/23 Yes White, Adrienne R, NP  vitamin C  (ASCORBIC ACID ) 250 MG tablet Take 1 tablet (250 mg total) by mouth daily. 05/07/23  Yes Kandee Orion, MD  XIIDRA 5 % SOLN Place 1 drop into both eyes 2 (two) times daily. 07/27/18  Yes [provider]  acetaminophen  (TYLENOL ) 650 MG CR tablet Take 650 mg by mouth every 8 (eight) hours as needed for pain.    [provider]  allopurinol  (ZYLOPRIM ) 100 MG tablet Take 1 tablet (100 mg total) by mouth daily. 07/03/23 10/01/23  [name], NP  azithromycin  (ZITHROMAX ) 250 MG tablet Take 1 tablet (250 mg total) by mouth daily for 4 days. Take first 2 tablets together, then 1 every day until finished. 07/03/23 07/07/23  [name], NP  EPINEPHrine  0.3 mg/0.3 mL IJ SOAJ injection Inject into the skin. 03/01/22   [provider]  fluconazole  (DIFLUCAN ) 150 MG tablet Take 1 tablet (150 mg total) by mouth every three (3) days as needed for up to 2 doses. 07/03/23   [name], NP  HYDROcodone -acetaminophen  (NORCO/VICODIN) 5-325 MG tablet Take 1 tablet by mouth every 6 (six) hours as needed. 04/19/20   [provider]  ivermectin (STROMECTOL) 3 MG TABS tablet TAKE THREE TABLETS BY MOUTH ON AN EMPTY STOMACH. REPEAT IN ONE WEEK. WASH BEDDING, TOWELS, WORN CLOTHES IN HOT  WATER AFTER EACH DOSE 07/03/23   [name], NP  levothyroxine  (SYNTHROID ) 112 MCG tablet Take 1 tablet (112 mcg total) by mouth daily. 07/03/23 10/01/23  [name], NP  mupirocin ointment (BACTROBAN) 2 % Apply 1 application  topically 3 (three) times daily. Patient not taking: Reported on 07/04/2023 02/04/19   [provider]  Nutritional Supplements (ENSURE PLANT-BASED PROTEIN) LIQD Take 1 Units by mouth as needed. 06/14/23   Buell Carmin, MD  ondansetron  (ZOFRAN -ODT) 4 MG disintegrating tablet Take 1 tablet (4 mg total) by mouth every 8 (eight) hours as needed for nausea or vomiting. 04/25/23   Buell Carmin, MD  pantoprazole  (PROTONIX ) 40 MG tablet Take 1 tablet (40 mg total) by mouth daily. 07/03/23   White, Maybelle Spatz, NP  Vitamin D , Ergocalciferol , (DRISDOL ) 1.25 MG (50000 UNIT) CAPS capsule Take 1 capsule (50,000 Units total) by mouth every 7 (seven) days. Patient taking differently: Take 5,000 Units by mouth every 7 (seven) days. 05/19/19   Sheron Dixons, MD    Family History  Problem Relation Age of Onset   Heart disease Father    Hearing loss Father    COPD Father    Depression Father    Stroke Father    Vision loss Father    Varicose Veins Father    Cancer Mother        lung   Depression Sister    Arthritis Sister    Arthritis Brother    Hernia Brother    Anxiety disorder Brother    Arthritis Brother    Hernia Brother    Anxiety disorder Brother    Urolithiasis Neg Hx    Kidney disease Neg Hx    Kidney cancer Neg Hx    Prostate cancer Neg Hx    Breast cancer Neg Hx      Social History   Tobacco Use   Smoking status: Former    Current packs/day: 0.00    Types: Cigarettes  Quit date: 11/17/1976    Years since quitting: 46.6   Smokeless tobacco: Never  Vaping Use   Vaping status: Never Used  Substance Use Topics   Alcohol  use: Not Currently    Comment: occ   Drug use: No    Allergies as of 07/04/2023 - Review Complete 07/04/2023  Allergen  Reaction Noted   Amoxicillin -pot clavulanate Anaphylaxis and Hives 05/07/2019   Aspirin Hives 01/15/2013   Bee venom Hives 01/20/2015   Cephalexin  Hives and Other (See Comments) 08/29/2016   Ciprofloxacin  Anaphylaxis and Swelling 12/07/2017   Fluticasone-salmeterol Shortness Of Breath 02/08/2018   Haloperidol  Nausea And Vomiting, Anaphylaxis, and Other (See Comments) 05/27/2013   Molds & smuts Shortness Of Breath and Itching 04/23/2019   Neomycin-polymyxin-dexameth Swelling 02/18/2022   Other Hives, Rash, Anaphylaxis, Itching, and Swelling 08/07/2012   Oxycodone-acetaminophen  Nausea And Vomiting, Other (See Comments), and Anxiety 08/07/2012   Peanut butter flavoring agent (non-screening) Anaphylaxis 10/29/2017   Peanuts [peanut oil] Anaphylaxis and Hives 08/07/2012   Penicillin g Anaphylaxis 07/14/2020   Penicillins Hives and Anaphylaxis 08/07/2012   Propoxyphene Anaphylaxis and Hives 08/07/2012   Sulfa antibiotics Anaphylaxis, Hives, and Itching 08/07/2012   Sulfacetamide sodium Anaphylaxis, Hives, and Itching 10/27/2014   Tramadol Hives and Other (See Comments) 01/15/2013   Amikacin Nausea And Vomiting and Other (See Comments) 01/15/2013   Bee pollen Other (See Comments) 05/27/2013   Imipramine Hives 03/26/2015   Prednisone Nausea And Vomiting, Anxiety, and Other (See Comments) 01/15/2013   Sulfur Hives 07/31/2013   Azithromycin  Other (See Comments) 07/11/2018   Colchicine  Other (See Comments) 05/05/2023   Elemental sulfur Hives 07/31/2013   Fluticasone  02/17/2020   Hymenoptera venom preparations Hives 03/26/2015   Lactose intolerance (gi)  10/05/2017   Oxycodone Other (See Comments) 05/07/2023   Peanut-containing drug products  02/17/2020   Phthalylsulfacetamide  04/19/2020   Pregabalin Other (See Comments) 07/11/2018   Risperidone Other (See Comments) 01/25/2018   Sulfacetamide  02/17/2020   Wound dressing adhesive  02/17/2020   Adhesive [tape] Hives and Rash 05/27/2013    Albuterol  Palpitations 10/22/2017   Hydroxyzine  Itching and Anxiety 06/30/2014    Review of Systems:    All systems reviewed and negative except where noted in HPI.   Physical Exam:  Vital signs in last 24 hours: Temp:  [98 F (36.7 C)-99.5 F (37.5 C)] 98.8 F (37.1 C) (06/19 1423) Pulse Rate:  [65-75] 75 (06/19 1423) Resp:  [16-18] 18 (06/19 1423) BP: (133-158)/(66-83) 155/83 (06/19 1423) SpO2:  [94 %-100 %] 98 % (06/19 1423)   General:   Pleasant, cooperative in NAD Head:  Normocephalic and atraumatic. Eyes:   No icterus.   Conjunctiva pink. PERRLA. Ears:  Normal auditory acuity. Neck:  Supple; no masses or thyroidomegaly Rectal:  Not performed. Msk:  Symmetrical without gross deformities.    Extremities:  Without edema, cyanosis or clubbing. Neurologic:  Alert and oriented x3;  grossly normal neurologically. Skin:  Intact without significant lesions or rashes. Psych:  Alert and cooperative. Normal affect.  LAB RESULTS: Recent Labs    07/04/23 1150  WBC 10.1  HGB 11.0*  HCT 33.3*  PLT 257   BMET Recent Labs    07/04/23 1150 07/05/23 0419  NA 134*  --   K 3.6  --   CL 102  --   CO2 23  --   GLUCOSE 107*  --   BUN 11  --   CREATININE 0.72 0.69  CALCIUM  8.8*  --    LFT Recent  Labs    07/04/23 1150  PROT 6.8  ALBUMIN 3.6  AST 22  ALT 15  ALKPHOS 78  BILITOT 0.6   PT/INR Recent Labs    07/04/23 1150  LABPROT 12.0  INR 0.9    STUDIES: CT ABDOMEN PELVIS W CONTRAST Result Date: 07/04/2023 CLINICAL DATA:  Abdominal pain, fever and chills, lower back pain EXAM: CT ABDOMEN AND PELVIS WITH CONTRAST TECHNIQUE: Multidetector CT imaging of the abdomen and pelvis was performed using the standard protocol following bolus administration of intravenous contrast. RADIATION DOSE REDUCTION: This exam was performed according to the departmental dose-optimization program which includes automated exposure control, adjustment of the mA and/or kV according to patient  size and/or use of iterative reconstruction technique. CONTRAST:  OMNIPAQUE  IOHEXOL  300 MG/ML  SOLN COMPARISON:  10/15/2015 FINDINGS: Lower chest: Large hiatal hernia with the majority of the stomach in an intrathoracic location. No acute pleural or parenchymal lung disease. Hepatobiliary: No focal liver abnormality is seen. Status post cholecystectomy. No biliary dilatation. Pancreas: Unremarkable. No pancreatic ductal dilatation or surrounding inflammatory changes. Spleen: Normal in size without focal abnormality. Adrenals/Urinary Tract: Kidneys enhance normally and symmetrically. No urinary tract calculi or obstructive uropathy within either kidney. Marked distension of the urinary bladder. No filling defects. Stomach/Bowel: No bowel obstruction or ileus. There is diverticulosis of the descending and sigmoid colon, with long segment mural thickening and pericolonic fat stranding within the mid sigmoid colon consistent with acute diverticulitis. No evidence of perforation, fluid collection, or abscess. Vascular/Lymphatic: Aortic atherosclerosis. No enlarged abdominal or pelvic lymph nodes. Reproductive: Stable 4.0 x 2.9 cm right adnexal cyst, previously evaluated by ultrasound and compatible with benign cyst. Uterus and left adnexa are unremarkable. Other: Trace pelvic free fluid. No free intraperitoneal gas. No abdominal wall hernia. Musculoskeletal: No acute or destructive bony abnormalities. Reconstructed images demonstrate no additional findings. IMPRESSION: 1. Acute uncomplicated sigmoid diverticulitis. No perforation, fluid collection, or abscess. 2. Marked distension of the urinary bladder. 3. Trace pelvic free fluid. 4. Large hiatal hernia, with the majority of the stomach in an intrathoracic location. 5.  Aortic Atherosclerosis (ICD10-I70.0). Electronically Signed   By: Bobbye Burrow M.D.   On: 07/04/2023 15:34   CT HEAD WO CONTRAST ( ) Result Date: 07/04/2023 CLINICAL DATA:  Head trauma,  minor. EXAM: CT HEAD WITHOUT CONTRAST TECHNIQUE: Contiguous axial images were obtained from the base of the skull through the vertex without intravenous contrast. RADIATION DOSE REDUCTION: This exam was performed according to the departmental dose-optimization program which includes automated exposure control, adjustment of the mA and/or kV according to patient size and/or use of iterative reconstruction technique. COMPARISON:  CT of the head dated May 05, 2023. FINDINGS: Brain: Mild-to-moderate age-related cerebral volume loss. Chronic lacunar infarct within the anterior limb of the right internal capsule. No evidence of acute intracranial injury. No evidence of hemorrhage, mass or hydrocephalus. Vascular: Vascular calcifications within the carotid siphons. Skull: Intact and unremarkable.  No lesions are present. Sinuses/Orbits: Normal. Other: None. IMPRESSION: 1. Mild to moderate cerebral volume loss and chronic lacunar infarct within the anterior limb of the right internal capsule. No apparent acute process. Electronically Signed   By: Maribeth Shivers M.D.   On: 07/04/2023 15:23   DG Chest Port 1 View Result Date: 07/04/2023 CLINICAL DATA:  Back pain, febrile, sepsis EXAM: PORTABLE CHEST 1 VIEW COMPARISON:  06/30/2021 FINDINGS: Single frontal view of the chest demonstrates a stable cardiac silhouette. Large hiatal hernia again noted. No acute airspace disease, effusion, or pneumothorax. There  are no acute bony abnormalities. IMPRESSION: 1. No acute intrathoracic process.  Stable hiatal hernia. Electronically Signed   By: Bobbye Burrow M.D.   On: 07/04/2023 14:25      Impression / Plan:   Assessment: Principal Problem:   Diverticulitis   Angela [name] is a 73 y.o. y/o female with uncomplicated acute sigmoid diverticulitis being treated with antibiotics.  The patient states that she feels like she is rushing her p.o. intake and would like to be put back down to clear liquids.  I have given  her clear liquids.  The patient has also requested to be put back on the dicyclomine  which she states that I had given her in the past and it helped her symptoms.  Plan:  The patient has been reassured about her diverticulitis being treated well and that her diet will be put to a clear liquid diet as she requested and she will also take dicyclomine  10 mg that she wants to be put as as needed which I have put in the order for her to take it as needed.  The patient has been explained the plan and agrees with it.  Thank you for involving me in the care of this patient.      LOS: 1 day   Marnee Sink, MD, MD. Sylvan Evener 07/05/2023, 4:06 PM,  Pager [phone] 7am-5pm  Check AMION for 5pm -7am coverage and on weekends   Note: This dictation was prepared with Dragon dictation along with smaller phrase technology. Any transcriptional errors that result from this process are unintentional.

## 2023-07-05 NOTE — Progress Notes (Signed)
 Attempted to pass meds to patient. This this RN went to list and explain medications to patient. Pt is unhappy with nearly all her medications. She has refused to take any until she gets her nebulizer treatment. Pt also has refused miralax  stating that she doesn't think its a good idea with her diagnoses

## 2023-07-05 NOTE — Progress Notes (Addendum)
 PROGRESS NOTE  Angela [name]    DOB: 1950-08-20, 73 y.o.  WUJ:[id number]    Code Status: Full Code   DOA: 07/04/2023   LOS: 1   Brief hospital course  Angela [name] is a 73 y.o. female with a PMH significant for diverticulitis, endometriosis, inguinal hernias, TIA, GERD, hiatal hernia, anemia, HTN, asthma, PTSD, vitamin D  deficiency, arthritis, depression, hypothyroidism, housing insecurity. At baseline, they are homeless and are independent with their ADLs.   They presented to the ED on 07/04/2023 with abdominal pain which she describes as constant for the past year.  She states that she has been seen by several emergency room's but has never had a CAT scan before.  She is concerned because she feels that she has lost a lot of weight in the past year due to poor appetite and intermittent nausea and vomiting.  She has tried several over-the-counter treatments as well as lidocaine  which she has received in emergency room visits in the past.  She states that she has a remote history of diverticulitis.  Her last bowel movement was last night and was watery.  She is unable to confirm if there was blood in the stool or not.   Pain is primarily located in right lower quadrant.  She states that she is post appendectomy, cholecystectomy, bilateral inguinal hernia repair and a procedure to tack up her uterus.  She also endorses having a severe hiatal hernia and thinks that she has an ulcer.  She also describes shortness of breath and unable to further describe. Of note, although patient is able to provide clear answers to several questions, she also demonstrates easy distraction and flight of ideas which make it difficult to appreciate the true duration and severity of her current situation.  Further details of her current presentation are collected through collateral data including report and chart review.  It appears that she presented to the urgent care yesterday and had a temperature of 100.6 and  was prescribed azithromycin  and ivermectin for a rash on her back.  The rash is resolved as of today and she thinks it was due to a location she was sleeping having fleas.  I am unable to confirm what medications she takes regularly or her current chronic conditions including behavioral health.   In the ED, it was found that they had stable vital signs.  Significant findings included: LA 0.7, Na+ 134, K+ 3.6, Mg++ 2.1, glucose 107, creatinine 0.72.  Normal hepatic levels.  WBC 10.1, hemoglobin 11 and otherwise normal CBC.  Blood culture was collected.  Urinalysis not acutely positive for infection. CT abdomen pelvis: Acute, uncomplicated sigmoid diverticulitis without perforation or abscess.  Confirms large hiatal hernia. CT head negative for acute abnormalities.  Does show mild to moderate cerebral volume loss Chest x-ray: No acute intrathoracic process   They were initially treated with Zofran , meropenem, IV fluids.    Patient was admitted to medicine service for further workup and management of diverticulitis as outlined in detail below.  07/05/23 -appears stable by objective measures, complaining of clinical worsening but non-acute abdominal exam.   Assessment & Plan  Principal Problem:   Diverticulitis  Acute sigmoid diverticulitis: As seen on abdominal CT without complication.  Patient has significant list of allergies to medications precluding most first-line therapies. - continue Meropenem - Analgesia as needed - requests morphine  and states that she is not allergic to this although its in her allergy list. - Fluid diet and advance as tolerated - encourage  PO hydration.  - GI consulted per patient request   Large hiatal hernia-describes significant weight loss over the past year - Consider general surgery consult for fundoplication evaluation - Antiemetics as needed, Carafate , PPI   Housing insecurity-patient would benefit from [facility] assistance   COPD-endorses nonspecific  shortness of breath.  No hypoxia noted - Continue as needed inhalers - requests albuterol  nebs and states that she is not allergic to this although its in her allergy list.   H/o hypokalemia. Within normal range but on lower end and patient requested potassium supplement - 20mEq oral ordered x1 - BMP am  Hypothyroidism- continue home levothyroxine   - TSH am as patient has not had medications in about a month  Anxiety  depression- patient has poorly controlled anxiety and states she hasn't taken her medications in about a month - restart paxil  at lower dose to ramp up - start atarax  PRN  HTN- poorly controlled as she has not been taking previously prescribed medications - add on home amlodipine   - can add on valsartan  as needed in 48 hours if not controlled on single agent  Body mass index is 25.4 kg/m.  VTE ppx: enoxaparin  (LOVENOX ) injection 40 mg Start: 07/04/23 2200  Diet:     Diet   Diet full liquid Room service appropriate? Yes; Fluid consistency: Thin   Consultants: GI  Subjective 07/05/23    Pt reports burning all over her abdominal wall and worse on RLQ. Feels unwell.    Objective  Blood pressure 133/71, pulse 69, temperature 98.5 F (36.9 C), temperature source Oral, resp. rate 16, height 5' 2 (1.575 m), weight 63 kg, SpO2 99%.  Intake/Output Summary (Last 24 hours) at 07/05/2023 0802 Last data filed at 07/05/2023 0400 Gross per 24 hour  Intake 0 ml  Output 1000 ml  Net -1000 ml   Filed Weights   07/04/23 1105  Weight: 63 kg    Physical Exam:  General: awake, alert, NAD HEENT: atraumatic, clear conjunctiva, anicteric sclera, MMM, hearing grossly normal Respiratory: normal respiratory effort. Cardiovascular: quick capillary refill, normal S1/S2, RRR, no JVD, murmurs Gastrointestinal: soft, ND, no masses. No guarding or rebound Nervous: A&O x3. no gross focal neurologic deficits, normal speech. Able to follow commands Extremities: moves all equally, no  edema, normal tone Skin: dry, intact, normal temperature, normal color. No rashes, lesions or ulcers on exposed skin Psychiatry: anxious with pressured speech, flight of ideas. Easily distracted  Labs   I have personally reviewed the following labs and imaging studies CBC    Component Value Date/Time   WBC 10.1 07/04/2023 1150   RBC 3.62 (L) 07/04/2023 1150   HGB 11.0 (L) 07/04/2023 1150   HGB 12.3 12/05/2016 1330   HCT 33.3 (L) 07/04/2023 1150   HCT 37.1 12/05/2016 1330   PLT 257 07/04/2023 1150   PLT 448 (H) 12/05/2016 1330   MCV 92.0 07/04/2023 1150   MCV 94 12/05/2016 1330   MCV 94 04/16/2014 0519   MCH 30.4 07/04/2023 1150   MCHC 33.0 07/04/2023 1150   RDW 13.9 07/04/2023 1150   RDW 14.0 12/05/2016 1330   RDW 13.1 04/16/2014 0519   LYMPHSABS 1.3 07/04/2023 1150   LYMPHSABS 1.6 12/05/2016 1330   LYMPHSABS 0.9 (L) 04/16/2014 0519   MONOABS 1.0 07/04/2023 1150   MONOABS 0.6 04/16/2014 0519   EOSABS 0.2 07/04/2023 1150   EOSABS 0.1 12/05/2016 1330   EOSABS 0.1 04/16/2014 0519   BASOSABS 0.1 07/04/2023 1150   BASOSABS 0.0 12/05/2016 1330  BASOSABS 0.0 04/16/2014 0519      Latest Ref Rng & Units 07/05/2023    4:19 AM 07/04/2023   11:50 AM 06/14/2023    2:54 AM  BMP  Glucose 70 - 99 mg/dL  308  96   BUN 8 - 23 mg/dL  11  19   Creatinine 6.57 - 1.00 mg/dL 8.46  9.62  9.52   Sodium 135 - 145 mmol/L  134  136   Potassium 3.5 - 5.1 mmol/L  3.6  4.2   Chloride 98 - 111 mmol/L  102  103   CO2 22 - 32 mmol/L  23  25   Calcium  8.9 - 10.3 mg/dL  8.8  9.7     CT ABDOMEN PELVIS W CONTRAST Result Date: 07/04/2023 CLINICAL DATA:  Abdominal pain, fever and chills, lower back pain EXAM: CT ABDOMEN AND PELVIS WITH CONTRAST TECHNIQUE: Multidetector CT imaging of the abdomen and pelvis was performed using the standard protocol following bolus administration of intravenous contrast. RADIATION DOSE REDUCTION: This exam was performed according to the departmental dose-optimization  program which includes automated exposure control, adjustment of the mA and/or kV according to patient size and/or use of iterative reconstruction technique. CONTRAST:  OMNIPAQUE  IOHEXOL  300 MG/ML  SOLN COMPARISON:  10/15/2015 FINDINGS: Lower chest: Large hiatal hernia with the majority of the stomach in an intrathoracic location. No acute pleural or parenchymal lung disease. Hepatobiliary: No focal liver abnormality is seen. Status post cholecystectomy. No biliary dilatation. Pancreas: Unremarkable. No pancreatic ductal dilatation or surrounding inflammatory changes. Spleen: Normal in size without focal abnormality. Adrenals/Urinary Tract: Kidneys enhance normally and symmetrically. No urinary tract calculi or obstructive uropathy within either kidney. Marked distension of the urinary bladder. No filling defects. Stomach/Bowel: No bowel obstruction or ileus. There is diverticulosis of the descending and sigmoid colon, with long segment mural thickening and pericolonic fat stranding within the mid sigmoid colon consistent with acute diverticulitis. No evidence of perforation, fluid collection, or abscess. Vascular/Lymphatic: Aortic atherosclerosis. No enlarged abdominal or pelvic lymph nodes. Reproductive: Stable 4.0 x 2.9 cm right adnexal cyst, previously evaluated by ultrasound and compatible with benign cyst. Uterus and left adnexa are unremarkable. Other: Trace pelvic free fluid. No free intraperitoneal gas. No abdominal wall hernia. Musculoskeletal: No acute or destructive bony abnormalities. Reconstructed images demonstrate no additional findings. IMPRESSION: 1. Acute uncomplicated sigmoid diverticulitis. No perforation, fluid collection, or abscess. 2. Marked distension of the urinary bladder. 3. Trace pelvic free fluid. 4. Large hiatal hernia, with the majority of the stomach in an intrathoracic location. 5.  Aortic Atherosclerosis (ICD10-I70.0). Electronically Signed   By: Bobbye Burrow M.D.   On:  07/04/2023 15:34   CT HEAD WO CONTRAST ( ) Result Date: 07/04/2023 CLINICAL DATA:  Head trauma, minor. EXAM: CT HEAD WITHOUT CONTRAST TECHNIQUE: Contiguous axial images were obtained from the base of the skull through the vertex without intravenous contrast. RADIATION DOSE REDUCTION: This exam was performed according to the departmental dose-optimization program which includes automated exposure control, adjustment of the mA and/or kV according to patient size and/or use of iterative reconstruction technique. COMPARISON:  CT of the head dated May 05, 2023. FINDINGS: Brain: Mild-to-moderate age-related cerebral volume loss. Chronic lacunar infarct within the anterior limb of the right internal capsule. No evidence of acute intracranial injury. No evidence of hemorrhage, mass or hydrocephalus. Vascular: Vascular calcifications within the carotid siphons. Skull: Intact and unremarkable.  No lesions are present. Sinuses/Orbits: Normal. Other: None. IMPRESSION: 1. Mild to moderate cerebral volume loss  and chronic lacunar infarct within the anterior limb of the right internal capsule. No apparent acute process. Electronically Signed   By: Maribeth Shivers M.D.   On: 07/04/2023 15:23   DG Chest Port 1 View Result Date: 07/04/2023 CLINICAL DATA:  Back pain, febrile, sepsis EXAM: PORTABLE CHEST 1 VIEW COMPARISON:  06/30/2021 FINDINGS: Single frontal view of the chest demonstrates a stable cardiac silhouette. Large hiatal hernia again noted. No acute airspace disease, effusion, or pneumothorax. There are no acute bony abnormalities. IMPRESSION: 1. No acute intrathoracic process.  Stable hiatal hernia. Electronically Signed   By: Bobbye Burrow M.D.   On: 07/04/2023 14:25    Disposition Plan & Communication  Patient status: Inpatient  Admitted From: homeless  Planned disposition location: TBD Anticipated discharge date: TBD pending completing IV Abx course  Family Communication: none    Author: Ree Candy, DO [facility] 07/05/2023, 8:02 AM   Available by Epic secure chat 7AM-7PM. If 7PM-7AM, please contact night-coverage.  [facility] contact information found on [email or web address].

## 2023-07-05 NOTE — Progress Notes (Signed)
   07/05/23 1307  Spiritual Encounters  Type of Visit Initial  Care provided to: Patient  Conversation partners present during encounter Nurse  Referral source Nurse (RN/NT/LPN)  Reason for visit Routine spiritual support (Pt requested a Bible)  OnCall Visit Yes  Spiritual Framework  Presenting Themes Meaning/purpose/sources of inspiration;Goals in life/care;Values and beliefs;Coping tools;Impactful experiences and emotions  Patient Stress Factors Other (Comment) (Pt expressed how anxious she felt that the hotel she was dismissed from by the police for not paying still has her wallet and personal things. Pt also stated she is a widow.)  Interventions  Spiritual Care Interventions Made Established relationship of care and support;Compassionate presence;Reflective listening;Narrative/life review;Explored values/beliefs/practices/strengths;Prayer;Encouragement  Intervention Outcomes  Outcomes Connection to spiritual care;Awareness around self/spiritual resourses;Connection to values and goals of care;Awareness of health;Awareness of support;Reduced anxiety;Other (comment)

## 2023-07-05 NOTE — TOC CM/SW Note (Signed)
 Transition of Care [facility]) - Inpatient Brief Assessment   Patient Details  Name: Nychelle Cassata MRN: [id number] Date of Birth: 1950/06/13  [facility] CM/SW Contact:    Odilia Bennett, LCSW Phone Number: 07/05/2023, 11:07 AM   Clinical Narrative: CSW met with patient to discuss discharge planning and needs but she declined to speak with CSW because she had just received morphine . CSW added resources for food, housing, transportation, utilities, and social isolation to her AVS. Patient does not have a PCP. Also add list to AVS. MD has been updated.  Transition of Care Asessment: Insurance and Status: Insurance coverage has been reviewed Patient has primary care physician: No Home environment has been reviewed: Homeless Prior level of function:: Independent Prior/Current Home Services: No current home services Social Drivers of Health Review: SDOH reviewed interventions complete Readmission risk has been reviewed: Yes Transition of care needs: transition of care needs identified, TOC will continue to follow

## 2023-07-05 NOTE — Discharge Instructions (Signed)
 Food Resources  Agency Name: [facility] Agency Address: [address] [place], [place] [address] Phone: [phone] Website: [email or web address] Service(s) Offered: Housing services, self-sufficiency, congregate meal program, weatherization program, Event organiser program, emergency food assistance,  housing counseling, home ownership program, wheels - to work program.  [employer] free for 60 and older at various locations from [employer], Monday-Friday:  [employer], [address] [place], [phone] -[facility], [address], [place] [phone]  -[facility], [address] [place] [phone]  -[address] [place], [phone]  Agency Name: [facility] Address: [phone] [address], [place], [place] [address] Phone: [phone] Website: [email or web address] Service(s) Offered: Home delivered hot, frozen, and emergency  meals. Grocery assistance program which matches  volunteers one-on-one with seniors unable to grocery shop  for themselves. Must be 60 years and older; less than 20  hours of in-home aide service, limited or no driving ability;  live alone or with someone with a disability; live in  [place].  Agency Name: [facility]) Address: [address] [place], [place] [address] Phone: [phone] Service(s) Offered: Food is served to shut-ins, homeless, elderly, and low income people in the community every Saturday (11:30 am-12:30 pm) and Sunday (12:30 pm-1:30pm). Volunteers also offer help and encouragement in seeking employment,  and spiritual guidance.  Agency Name: Department of Social Services Address: [address] [place], [place] [address] Phone: [phone] Service(s) Offered: Child support services; child welfare services; food stamps; Medicaid; work first family assistance; and aid  with fuel,  rent, food and medicine.  Agency Name: [facility]itian Address: [address], [place], [place] Phone: [phone] Website: [email or web address] Services Offered: Monday 10:00am-12:00, 8:00pm-9:00pm, and Friday 10:00am-12:00.  Agency Name: [facility] [place] Address: [address], [place], [place] [address] Phone: [phone] Website: www.[email or web address] Service(s) Offered: Serves weekday meals, open from 11:30 am- 1:00 pm., and 6:30-7:30pm, Monday-Wednesday-Friday distributes food 3:30-6pm, Monday-Wednesday-Friday.  Agency Name: [facility] Address: [address] [place], [place] Phone: [phone] Website: [email or web address] Services Offered: Distributes food the 4th Saturday of the month, starting at 8:00 am  Agency Name: [facility] Address: [id number] [address] [place], [place] [address] Phone: [phone] Website: [email or web address] Service(s) Offered: Bread of life, weekly food pantry. Open Wednesdays from 10:00am-noon.  Agency Name: [facility] [employer] Address: [address] [place], [place], [place] Phone: [phone] Services Offered: Distributes food 9am-1pm, Monday-Thursday. Call for details.  Agency Name: [facility] Address: [address], [place], [place] [address] Phone: [phone] Website: [email or web address].[email or web address] Service(s) Offered: Games developer. Call for assistance.  Agency Name: [facility] Address: [address], [place], [place] [address] Phone: [phone] Service Offered: Emergency Food Pantry. Call for appointment.  Agency Name: [facility] Address: [address], [place], [place] [address] Phone: [phone] Website: [email or web address] Services Offered: Games developer. Call for details  Agency Name: [facility] Address: [address] [place], [place] Phone:  [phone] Website: [email or web address] Service(s) Offered: Emergency Food Pantry. Call for details.  Agency Name: Holiday representative Address: [address] [place], [place] [address] Phone: [phone] or [phone] Website: [email or web address] Service(s) Offered: Distribute food 9am-11:30 am, Tuesday-Friday, and 1-3:30pm, Monday-Friday. Food pantry Monday-Friday 1pm-3pm, fresh items, Mon.-Wed.-Fri.  Agency Name: [facility] Address: [address] [place], [place] [address] Phone: [phone] Website: www.[email or web address] Services Offered: Distribute food Tues and Sats from 9:00am-noon.  Closed 1st Saturday of each month. Call for details  Agency Name: [employer] Address: [place]ne [place] [facility] [address] [place] [address] Phone: [phone]  Services Offered: Delivers meals every Thursday   Rent/Utility/Housing  Agency Name: [facility] Agency Address: [address] [place], [place] [address] Phone: [phone] Email: [email or web address] Website: [email or web address] Service(s) Offered: Housing services, self-sufficiency, congregate meal program, weatherization program, Field seismologist program, emergency food assistance,  housing counseling, home ownership program, wheels -towork program.  Agency Name: [facility] Address: [address] [place], [place] [address] Phone: [phone] (8a-4p) [phone] (8p- 10p) Email: [email or web address] Website: [email or web address] Service(s) Offered: A program for homeless and/or needy men that includes one-on-one counseling, life skills training and job rehabilitation.  Agency Name: [facility] [place] Address: [address] [place], [place] [address] Phone: [phone] Website: www.[email or web address] Service(s) Offered: Assistance to needy in emergency with  utility bills, heating fuel, and prescriptions. Shelter for homeless 7pm-7am. May 11, 2016 15  Agency Name: [facility] [place] (Developmentally Disabled) Address: [address], [place], [place] [address] Phone: [phone] Contact Person: Genie Key Email: [email or web address] Website: [email or web address] Service(s) Offered: Helps individuals with developmental disabilities move from housing that is more restrictive to homes where they  can achieve greater independence and have more  opportunities.  Agency Name: [facility] Address: [address], [place], [place] [address] Phone: [phone] Email: [email or web address] Website: www.[email or web address].org Service(s) Offered: Provides affordable housing for low-income families, elderly, and disabled individuals. Offer a wide range of  programs and services, from financial planning to afterschool and summer programs.  Agency Name: Department of Social Services Address: [address] [place], [place] [address] Phone: [phone] Service(s) Offered: Child support services; child welfare services; food stamps; Medicaid; work first family assistance; and aid with fuel,  rent, food and medicine.  Agency Name: [facility] [place] [employer]. Address: [facility] [address] [place], [place]  [address] Phone: [phone] Website: www.familyabuseservices.org Service(s) Offered: 24 hour Crisis Line: [phone]; 24 hour Emergency Shelter; Transitional Housing; Support Groups; Scientist, physiological; [employer]; Hispanic Outreach: [phone];  Visitation Center: [phone].  Agency Name: [facility] [place]. Address: [address] [place], [place] [address] Phone: [phone] Service(s) Offered: CAP Services; Home and [employer]; Individual or Group Supports; Respite Care Non-Institutional Nursing;  Residential Supports; Respite Care and Personal Care  Services; Transportation; Family and Friends Night; Recreational Activities; Three Nutritious Meals/Snacks; Consultation with Registered Dietician; Twenty-four hour Registered Nurse Access; Daily and [employer]; Camp Green Leaves; [place] for the [employer] (During Summer Months) Bingo Night (Every  Wednesday Night); Special Populations Dance Night  (Every Tuesday Night); Professional Hair Care Services.  Agency Name: [facility] Address: [address], [place], [place] [address] Phone: [phone] Contact Person: Richardo Chandler Website: [email or web address].Physicist, medical) Offered: Residential treatment facility for women; food and  clothing, educational & employment development and  transportation to work; Counsellor of financial skills;  parenting and family reunification; emotional and spiritual  support; transitional housing for program graduates.  Agency Name: [facility] Address: [address] [place], [place] [address] Phone: [phone] Email: [email or web address] Website: [email or web address].es Service(s) Offered: Public housing units for elderly, disabled, and low income people; housing choice vouchers for income eligible  applicants; shelter plus care vouchers; and Psychologist, clinical.  Agency Name: [facility] [employer] Address: [address] [place], [place] [address] Phone: [phone] Email: [email or web address] Website: www.habitatalamance.org  Service(s) Offered: Build houses for families in need of decent housing. Each adult in the family must invest 200 hours of labor on  someone else's house, work with volunteers to build their own house, attend classes on budgeting, home maintenance, yard care, and attend homeowner association meetings.  Agency Name: [facility] Address: [address], [place], [place] [address] Phone: [phone] Website:  www.rsli.org Service(s) Offered: Intermediate care facilities for intellectually delayed, Supervised Living in group homes for adults with developmental disabilities, Supervised Living for people who have dual diagnoses (MRMI), Independent Living, Supported Living, respite and a variety of CAP services, pre-vocational services, day supports, and [employer].  Agency Name: [place] [facility] Phone: [phone] Website: www.NCForeclosurePrevention.gov Service(s) Offered: Zero-interest, deferred loans to homeowners struggling to pay their mortgage. Call for more information.    Transportation Resources for [employer]  Agency Name: [facility] Agency Address: [address] [place] [place], [place] [address] Phone: [phone] Email: [email or web address] Website: www.[email or web address].org Service(s) Offered: Housing services, self-sufficiency, congregate meal program, weatherization program, Field seismologist program, emergency food assistance,  housing counseling, home ownership program, wheels-towork program.  Agency Name: [facility] [phone]) Address: [address], [place], [place] [address] Phone: [phone] Website: www.acta-.com Service(s) Offered: Transportation for [employer], subscription and demand response; Dial-a-Ride for citizens 33 years of age or older.  Agency Name: Department of Social Services Address: [address] [place], [place] [address] Phone: [phone] Service(s) Offered: Child support services; child welfare services; food stamps; Medicaid; work first family assistance; and aid with fuel,  rent, food and medicine, transportation assistance.  Agency Name: Disabled [employer] (DAV) Transportation  Network Phone: [phone] Service(s) Offered: Transports veterans to the [facility] medical center. Call  forty-eight hours in  advance and leave the name, telephone  number, date, and time of appointment. Veteran will be  contacted by the driver the day before the appointment to  arrange a pick up point    [employer] ACTA currently provides door to door services. ACTA connects with PART daily for services to [facility]. ACTA also performs contract services to [employer] operates 27 vehicles, all but 3 mini-vans are equipped with lifts for special needs as well as the general public. ACTA drivers are each CDL certified and trained in First Aid and CPR. ACTA was established in 2002 by [employer]. An independent Industrial/product designer. ACTA operates via Cytogeneticist with required local 10% match funding from [place]. ACTA provides over 80,000 passenger trips each year, including [facility] and [employer] sites.  Call at least by 11 AM one business day prior to needing transportation  [employer].                      [place], [place] [address]     Office Hours: Monday-Friday  8 AM - 5 PM   Agency Name: [facility] Agency Address: [address], [place], [place] [address] Phone: [phone] Website: www.alamanceservices.org Service(s) Offered: Housing services, self-sufficiency, congregate meal program, and individual development account program.  Agency Name: [facility] [place] Address: [address] [place], [place] [address] Phone: [phone] Email: [email or web address] Website: www.alliedchurches.org Service(s) Offered: Housing the homeless, feeding the hungry, Company secretary, job and education related services.  Agency Name: [employer] Address: [address], [place], [place]  [address] Phone: [phone] Email: [email or web address] Service(s) Offered: Counseling, problem pregnancy, advocacy  for Hispanics, limited emergency financial assistance.  Agency Name: Department of Social Services Address: [address] [place], [place] [address] Phone: [phone] Website: www.[email or web address]-.com/dss Service(s) Offered: Child support services; child welfare services; SNAP; Medicaid; work first family assistance; and aid with fuel,  rent, food and medicine.  Agency Name: Holiday representative Address: [address], [place], [place] [address] Phone: [phone] or [phone] Email: [email or web address] Service(s) Offered: Family services and transient assistance; emergency food, fuel, clothing, limited furniture, utilities; budget counseling, general counseling; give a kid a coat; thrift store; Christmas food and toys. Utility assistance, food pantry, rental  assistance, life sustaining medicine   Do you feel isolated?  The Institute on Aging offers a [employer] that anyone can call toll free at [phone]. The friendship line is available 24 hours a day  [employer] is a Program of All-inclusive Care for the Elderly (PACE). Their mission is to promote and sustain the independence of seniors wishing to remain in the community. They provide seniors with comprehensive long-term health, social, medical and dietary care. Their program is a safe alternative to nursing home care. [phone]  [facility] Physical Address [facility] [address] [place], [place] [address] Phone: [phone]. . Online zoom yoga class, connect with others without leaving your home [facility] offers [place] dance cardio sessions for individuals via Zoom. This program provides: - Dance fitness activities Please contact program for more information. Servinganyone in need adults 18+ hiv/aids individuals families Call [phone]  Email [email or web address] to get more info  Humana offers an online [employer] to  individuals where they can receive help to focus on their best health. Whether you're a Humana member or not, the neighborhood center offers a... Main Serviceshealth education  exercise & fitness  community support services  recreation  virtual support Other Servicessupport groups Servinganyone in need adults young adults teens seniors individuals families [email or web address] to get more info  Schedule on their website  The [facility] offers an array of activities for adults age 80 and over. This program provides:- Fitness and health programs- Tech classes- Activity books Main Serviceshealth education  community support services  exercise & fitness  recreation  more education Servingseniors  Call [phone]    For more resources go online to RhodeIslandBargains.co.uk and type in you zipcode    Some PCP options in [place] area- not a comprehensive list  [facility]- [phone] [facility]- [phone] [facility]- [phone] [facility]- [phone] [facility]- [phone] Kerney Pee- [phone]  or [facility] Physician Referral Line [phone]

## 2023-07-05 NOTE — Plan of Care (Signed)
  Problem: Education: Goal: Knowledge of General Education information will improve Description: Including pain rating scale, medication(s)/side effects and non-pharmacologic comfort measures 07/05/2023 1919 by Toi Foster., RN Outcome: Progressing 07/05/2023 1907 by Toi Foster., RN Outcome: Progressing   Problem: Health Behavior/Discharge Planning: Goal: Ability to manage health-related needs will improve 07/05/2023 1919 by Toi Foster., RN Outcome: Progressing 07/05/2023 1907 by Toi Foster., RN Outcome: Progressing   Problem: Clinical Measurements: Goal: Ability to maintain clinical measurements within normal limits will improve 07/05/2023 1919 by Toi Foster., RN Outcome: Progressing 07/05/2023 1907 by Toi Foster., RN Outcome: Progressing Goal: Will remain free from infection 07/05/2023 1919 by Toi Foster., RN Outcome: Progressing 07/05/2023 1907 by Toi Foster., RN Outcome: Progressing Goal: Diagnostic test results will improve 07/05/2023 1919 by Toi Foster., RN Outcome: Progressing 07/05/2023 1907 by Toi Foster., RN Outcome: Progressing

## 2023-07-05 NOTE — Progress Notes (Signed)
 PHARMACY NOTE:  ANTIMICROBIAL RENAL DOSAGE ADJUSTMENT  Current antimicrobial regimen includes a mismatch between antimicrobial dosage and estimated renal function.  As per policy approved by the Pharmacy & Therapeutics and Medical Executive Committees, the antimicrobial dosage will be adjusted accordingly.  Current antimicrobial dosage:  meropenem 1 gram IV every 12 hours  Indication: diverticulitis  Renal Function:  Estimated Creatinine Clearance: 53 mL/min (by C-G formula based on SCr of 0.69 mg/dL).    Antimicrobial dosage has been changed to:  meropenem 1 gram IV every 8 hours   Thank you for allowing pharmacy to be a part of this patient's care.  Adalberto Acton, [facility] 07/05/2023 7:41 AM

## 2023-07-05 NOTE — Plan of Care (Signed)

## 2023-07-06 DIAGNOSIS — K5792 Diverticulitis of intestine, part unspecified, without perforation or abscess without bleeding: Secondary | ICD-10-CM | POA: Diagnosis not present

## 2023-07-06 LAB — COMPREHENSIVE METABOLIC PANEL WITH GFR
ALT: 13 U/L (ref 0–44)
AST: 17 U/L (ref 15–41)
Albumin: 3.1 g/dL — ABNORMAL LOW (ref 3.5–5.0)
Alkaline Phosphatase: 74 U/L (ref 38–126)
Anion gap: 5 (ref 5–15)
BUN: 7 mg/dL — ABNORMAL LOW (ref 8–23)
CO2: 26 mmol/L (ref 22–32)
Calcium: 8.3 mg/dL — ABNORMAL LOW (ref 8.9–10.3)
Chloride: 105 mmol/L (ref 98–111)
Creatinine, Ser: 0.56 mg/dL (ref 0.44–1.00)
GFR, Estimated: >60 mL/min (ref >60)
Glucose, Bld: 102 mg/dL — ABNORMAL HIGH (ref 70–99)
Potassium: 4.5 mmol/L (ref 3.5–5.1)
Sodium: 136 mmol/L (ref 135–145)
Total Bilirubin: 0.9 mg/dL (ref 0.0–1.2)
Total Protein: 6.5 g/dL (ref 6.5–8.1)

## 2023-07-06 LAB — CULTURE, BLOOD (ROUTINE X 2)

## 2023-07-06 LAB — CBC
HCT: 33.7 % — ABNORMAL LOW (ref 36.0–46.0)
Hemoglobin: 11.1 g/dL — ABNORMAL LOW (ref 12.0–15.0)
MCH: 30.3 pg (ref 26.0–34.0)
MCHC: 32.9 g/dL (ref 30.0–36.0)
MCV: 92.1 fL (ref 80.0–100.0)
Platelets: 254 10*3/uL (ref 150–400)
RBC: 3.66 MIL/uL — ABNORMAL LOW (ref 3.87–5.11)
RDW: 13.8 % (ref 11.5–15.5)
WBC: 7.6 10*3/uL (ref 4.0–10.5)
nRBC: 0 % (ref 0.0–0.2)

## 2023-07-06 LAB — TSH: TSH: 33.515 u[IU]/mL — ABNORMAL HIGH (ref 0.350–4.500)

## 2023-07-06 MED ORDER — BOOST / RESOURCE BREEZE PO LIQD CUSTOM
1.0000 | Freq: Three times a day (TID) | ORAL | Status: DC
  Administered 2023-07-06 – 2023-07-09 (×3): 1 via ORAL

## 2023-07-06 MED ORDER — FERROUS SULFATE 325 (65 FE) MG PO TABS
325.0000 mg | ORAL_TABLET | Freq: Every day | ORAL | Status: DC
  Administered 2023-07-06 – 2023-07-08 (×3): 325 mg via ORAL
  Filled 2023-07-06 (×4): qty 1

## 2023-07-06 MED ORDER — PANTOPRAZOLE SODIUM 20 MG PO TBEC
20.0000 mg | DELAYED_RELEASE_TABLET | Freq: Every day | ORAL | Status: DC
  Administered 2023-07-06 – 2023-07-09 (×4): 20 mg via ORAL
  Filled 2023-07-06 (×4): qty 1

## 2023-07-06 NOTE — Progress Notes (Signed)
 The patient is resting comfortably in no apparent distress.  The patient is being treated for her diverticulitis which was seen on the CT scan as uncomplicated sigmoid diverticulitis. The patient's white cell count has remained normal and has even decreased to 7.6 this morning.  Her hemoglobin is also stable there is nothing to do further from a GI point of view.  I will sign off.  Please call if any further GI concerns or questions.  We would like to thank you for the opportunity to participate in the care of [employer].

## 2023-07-06 NOTE — Progress Notes (Signed)
 VAST RN returned to bedside to place IV. Pt stated, I am dehydrated and you won't be able to get an IV. I have been asking for water all day and have not gotten any. Explained to patient that we needed to get her an IV placed for fluids and medications to help her. She state, You cannot start my IV until I get 2 pitchers of water. VAST RN left room to update unit nurse and get 2 pitchers of water. Patient approached  VAST RN at nurses' station and stated, I am ready for you to do my IV now. Unit RN offered to get water and I returned to patient's room to obtain IV access. Pt then stated, You need to wait until I get my water. No one wants to help me. I am still waiting for water. I politely told her that I could not wait and I could do her IV now or she would have to wait until later. She finally held her arm out and let me obtain IV access.

## 2023-07-06 NOTE — Plan of Care (Signed)

## 2023-07-06 NOTE — Progress Notes (Signed)
 PROGRESS NOTE  Angela [name]    DOB: December 02, 1950, 73 y.o.  ZOX:[id number]    Code Status: Full Code   DOA: 07/04/2023   LOS: 2   Brief hospital course  Angela [name] is a 73 y.o. female with a PMH significant for diverticulitis, endometriosis, inguinal hernias, TIA, GERD, hiatal hernia, anemia, HTN, asthma, PTSD, vitamin D  deficiency, arthritis, depression, hypothyroidism, housing insecurity. At baseline, they are homeless and are independent with their ADLs. Presents with uncomplicated sigmoid diverticulitis. Allergies limit treatment options so admitted for IV treatment. GI consulted per patient request. Social aspects determinant factor for dc. She is otherwise stable for dc.    In the ED, it was found that they had stable vital signs.  Significant findings included: LA 0.7, Na+ 134, K+ 3.6, Mg++ 2.1, glucose 107, creatinine 0.72.  Normal hepatic levels.  WBC 10.1, hemoglobin 11 and otherwise normal CBC.  Blood culture was collected.  Urinalysis not acutely positive for infection. CT abdomen pelvis: Acute, uncomplicated sigmoid diverticulitis without perforation or abscess.  Confirms large hiatal hernia. CT head negative for acute abnormalities.  Does show mild to moderate cerebral volume loss Chest x-ray: No acute intrathoracic process   They were initially treated with Zofran , meropenem, IV fluids.    07/06/23 -appears stable by objective measures, complaining of clinical worsening but non-acute abdominal exam.   Assessment & Plan  Principal Problem:   Diverticulitis  Acute sigmoid diverticulitis: As seen on abdominal CT without complication.  Patient has significant list of allergies to medications precluding most first-line therapies. Thanks to their thorough review of medications tolerated in past and allergy list, pharmacy has recommended that patient can safely transition to levofloxacin  and metronidazole  when discharged to complete treatment as she has tolerated these  well in the recent past without reaction.  - continue Meropenem - Analgesia as needed - requests morphine  and states that she is not allergic to this although its in her allergy list. - Fluid diet ordered and patient refuses to advance her diet.  - encourage PO hydration.  - GI consulted per patient request- added dicyclomine  per patient request and no further intervention warranted at this time- has signed off.    Large hiatal hernia-describes significant weight loss over the past year - Consider general surgery consult for fundoplication evaluation - Antiemetics as needed, Carafate , PPI   Housing insecurity-patient would benefit from [facility] assistance but refused to talk with TOC when they stopped to talk to her. Patient states that she wants to stay at a rehab but refused to be seen by PT/OT for evaluation.    COPD-endorses nonspecific shortness of breath.  No hypoxia noted - Continue as needed inhalers - requests albuterol  nebs and states that she is not allergic to this although its in her allergy list.   H/o hypokalemia. K+ Within normal range but on lower end and patient demanded potassium supplement because she feels she needs a higher level so oral ordered x1. Her K+ repeat today is 4.5.   Hypothyroidism- continue home levothyroxine   - TSH elevated which is to be expected as pt has not had medications in about a month. Will need outpatient follow up for recheck. Continuing on prior dose of levothyroxine  for now  Anxiety  depression- patient has poorly controlled anxiety and states she hasn't taken her medications in about a month - restart paxil  at lower dose to ramp up - start atarax  PRN  HTN- poorly controlled as she has not been taking previously prescribed  medications - add on home amlodipine   - can add on valsartan  as needed in 48 hours if not controlled on single agent  Body mass index is 25.4 kg/m.  VTE ppx: enoxaparin  (LOVENOX ) injection 40 mg Start: 07/04/23  2200  Diet:     Diet   Diet clear liquid Fluid consistency: Thin   Consultants: GI  Subjective 07/06/23    Pt reports continuously feeling terrible without any improvement. Complains about the care she is receiving in myriad of ways and reasons. Denies BM since arrival. Has long list of requests and concerns that were all addressed exhaustively during our encounter. She again refuses to see TOC or PT because she thinks she knows more than they do and won't help her any way.    Objective  Blood pressure 133/71, pulse 69, temperature 98.5 F (36.9 C), temperature source Oral, resp. rate 16, height 5' 2 (1.575 m), weight 63 kg, SpO2 99%.  Intake/Output Summary (Last 24 hours) at 07/06/2023 0755 Last data filed at 07/06/2023 0439 Gross per 24 hour  Intake 970 ml  Output --  Net 970 ml   Filed Weights   07/04/23 1105  Weight: 63 kg    Physical Exam:  General: awake, alert, NAD HEENT: atraumatic, clear conjunctiva, anicteric sclera, MMM, hearing grossly normal Respiratory: normal respiratory effort. Cardiovascular: quick capillary refill, normal S1/S2, RRR, no JVD, murmurs Gastrointestinal: soft, ND, no masses. No guarding or rebound although diffusely endorses pain to palpation.  Nervous: A&O x3. no gross focal neurologic deficits, normal speech. Able to follow commands Extremities: moves all equally, no edema, normal tone Skin: dry, intact, normal temperature, normal color. No rashes, lesions or ulcers on exposed skin Psychiatry: anxious with pressured speech, flight of ideas. Easily distracted  Labs   I have personally reviewed the following labs and imaging studies CBC    Component Value Date/Time   WBC 7.6 07/06/2023 0448   RBC 3.66 (L) 07/06/2023 0448   HGB 11.1 (L) 07/06/2023 0448   HGB 12.3 12/05/2016 1330   HCT 33.7 (L) 07/06/2023 0448   HCT 37.1 12/05/2016 1330   PLT 254 07/06/2023 0448   PLT 448 (H) 12/05/2016 1330   MCV 92.1 07/06/2023 0448   MCV 94  12/05/2016 1330   MCV 94 04/16/2014 0519   MCH 30.3 07/06/2023 0448   MCHC 32.9 07/06/2023 0448   RDW 13.8 07/06/2023 0448   RDW 14.0 12/05/2016 1330   RDW 13.1 04/16/2014 0519   LYMPHSABS 1.3 07/04/2023 1150   LYMPHSABS 1.6 12/05/2016 1330   LYMPHSABS 0.9 (L) 04/16/2014 0519   MONOABS 1.0 07/04/2023 1150   MONOABS 0.6 04/16/2014 0519   EOSABS 0.2 07/04/2023 1150   EOSABS 0.1 12/05/2016 1330   EOSABS 0.1 04/16/2014 0519   BASOSABS 0.1 07/04/2023 1150   BASOSABS 0.0 12/05/2016 1330   BASOSABS 0.0 04/16/2014 0519      Latest Ref Rng & Units 07/06/2023    4:48 AM 07/05/2023    4:19 AM 07/04/2023   11:50 AM  BMP  Glucose 70 - 99 mg/dL 829   562   BUN 8 - 23 mg/dL 7   11   Creatinine 1.30 - 1.00 mg/dL 8.65  7.84  6.96   Sodium 135 - 145 mmol/L 136   134   Potassium 3.5 - 5.1 mmol/L 4.5   3.6   Chloride 98 - 111 mmol/L 105   102   CO2 22 - 32 mmol/L 26   23   Calcium  8.9 -  10.3 mg/dL 8.3   8.8     CT ABDOMEN PELVIS W CONTRAST Result Date: 07/04/2023 CLINICAL DATA:  Abdominal pain, fever and chills, lower back pain EXAM: CT ABDOMEN AND PELVIS WITH CONTRAST TECHNIQUE: Multidetector CT imaging of the abdomen and pelvis was performed using the standard protocol following bolus administration of intravenous contrast. RADIATION DOSE REDUCTION: This exam was performed according to the departmental dose-optimization program which includes automated exposure control, adjustment of the mA and/or kV according to patient size and/or use of iterative reconstruction technique. CONTRAST:  OMNIPAQUE  IOHEXOL  300 MG/ML  SOLN COMPARISON:  10/15/2015 FINDINGS: Lower chest: Large hiatal hernia with the majority of the stomach in an intrathoracic location. No acute pleural or parenchymal lung disease. Hepatobiliary: No focal liver abnormality is seen. Status post cholecystectomy. No biliary dilatation. Pancreas: Unremarkable. No pancreatic ductal dilatation or surrounding inflammatory changes. Spleen:  Normal in size without focal abnormality. Adrenals/Urinary Tract: Kidneys enhance normally and symmetrically. No urinary tract calculi or obstructive uropathy within either kidney. Marked distension of the urinary bladder. No filling defects. Stomach/Bowel: No bowel obstruction or ileus. There is diverticulosis of the descending and sigmoid colon, with long segment mural thickening and pericolonic fat stranding within the mid sigmoid colon consistent with acute diverticulitis. No evidence of perforation, fluid collection, or abscess. Vascular/Lymphatic: Aortic atherosclerosis. No enlarged abdominal or pelvic lymph nodes. Reproductive: Stable 4.0 x 2.9 cm right adnexal cyst, previously evaluated by ultrasound and compatible with benign cyst. Uterus and left adnexa are unremarkable. Other: Trace pelvic free fluid. No free intraperitoneal gas. No abdominal wall hernia. Musculoskeletal: No acute or destructive bony abnormalities. Reconstructed images demonstrate no additional findings. IMPRESSION: 1. Acute uncomplicated sigmoid diverticulitis. No perforation, fluid collection, or abscess. 2. Marked distension of the urinary bladder. 3. Trace pelvic free fluid. 4. Large hiatal hernia, with the majority of the stomach in an intrathoracic location. 5.  Aortic Atherosclerosis (ICD10-I70.0). Electronically Signed   By: Bobbye Burrow M.D.   On: 07/04/2023 15:34   CT HEAD WO CONTRAST ( ) Result Date: 07/04/2023 CLINICAL DATA:  Head trauma, minor. EXAM: CT HEAD WITHOUT CONTRAST TECHNIQUE: Contiguous axial images were obtained from the base of the skull through the vertex without intravenous contrast. RADIATION DOSE REDUCTION: This exam was performed according to the departmental dose-optimization program which includes automated exposure control, adjustment of the mA and/or kV according to patient size and/or use of iterative reconstruction technique. COMPARISON:  CT of the head dated May 05, 2023. FINDINGS: Brain:  Mild-to-moderate age-related cerebral volume loss. Chronic lacunar infarct within the anterior limb of the right internal capsule. No evidence of acute intracranial injury. No evidence of hemorrhage, mass or hydrocephalus. Vascular: Vascular calcifications within the carotid siphons. Skull: Intact and unremarkable.  No lesions are present. Sinuses/Orbits: Normal. Other: None. IMPRESSION: 1. Mild to moderate cerebral volume loss and chronic lacunar infarct within the anterior limb of the right internal capsule. No apparent acute process. Electronically Signed   By: Maribeth Shivers M.D.   On: 07/04/2023 15:23   DG Chest Port 1 View Result Date: 07/04/2023 CLINICAL DATA:  Back pain, febrile, sepsis EXAM: PORTABLE CHEST 1 VIEW COMPARISON:  06/30/2021 FINDINGS: Single frontal view of the chest demonstrates a stable cardiac silhouette. Large hiatal hernia again noted. No acute airspace disease, effusion, or pneumothorax. There are no acute bony abnormalities. IMPRESSION: 1. No acute intrathoracic process.  Stable hiatal hernia. Electronically Signed   By: Bobbye Burrow M.D.   On: 07/04/2023 14:25    Disposition Plan &  Communication  Patient status: Inpatient  Admitted From: homeless  Planned disposition location: TBD Anticipated discharge date: TBD pending completing IV Abx course  Family Communication: none    Author: Ree Candy, DO [facility] 07/06/2023, 7:55 AM   Available by Epic secure chat 7AM-7PM. If 7PM-7AM, please contact night-coverage.  [facility] contact information found on [email or web address].

## 2023-07-06 NOTE — Plan of Care (Signed)

## 2023-07-06 NOTE — Care Management Important Message (Signed)
 Important Message  Patient Details  Name: Angela [name] MRN: [id number] Date of Birth: 02-Jun-1950   Important Message Given:  Yes - Medicare IM     Anise Kerns 07/06/2023, 12:33 PM

## 2023-07-06 NOTE — Progress Notes (Signed)
 VAST consult received to obtain IV access. While at neighboring bedside, patient began crying; stated she wanted her IV out now as it was red and painful.  Upon arrival at bedside, immediately discontinued PIV in right hand as charted in IV flowsheets.  Patient stated she is right handed and wanted IV in left arm. Upon assessment, visualized vessels were extremely small. Pt stated, they are small and they roll. You get one chance. I educated patient and offered to use ultrasound for IV assessment. Patient refused saying, I know my body. Ultrasound does not work for me. After an hour the site will be burning and my whole arm will be swollen. You need to stick my hand. Education provided surrounding small vessels in her hand, as well as difficulty using bathroom and completing daily tasks. Pt insisted. VAST RN attempted to place 24G in left hand unsuccessfully. Pt stated, I need to drink fluids and you need to come back later. Unit RN notified.

## 2023-07-06 NOTE — Progress Notes (Signed)
 PT Cancellation Note  Patient Details Name: Angela [name] MRN: [id number] DOB: 03/13/1950   Cancelled Treatment:    Reason Eval/Treat Not Completed: Other (comment).  PT consult received.  Chart reviewed.  Pt resting in bed upon PT arrival; pt declining therapy eval d/t just wanting to rest/take a nap so she could feel better.  Will re-attempt PT evaluation at a later date/time.  Amador Junes, PT 07/06/23, 1:50 PM

## 2023-07-07 DIAGNOSIS — K5792 Diverticulitis of intestine, part unspecified, without perforation or abscess without bleeding: Secondary | ICD-10-CM | POA: Diagnosis not present

## 2023-07-07 MED ORDER — MORPHINE SULFATE (PF) 2 MG/ML IV SOLN
1.0000 mg | INTRAVENOUS | Status: DC | PRN
  Administered 2023-07-07: 1 mg via INTRAVENOUS
  Filled 2023-07-07: qty 1

## 2023-07-07 MED ORDER — HYDRALAZINE HCL 25 MG PO TABS
25.0000 mg | ORAL_TABLET | Freq: Three times a day (TID) | ORAL | Status: DC | PRN
  Administered 2023-07-08: 25 mg via ORAL
  Filled 2023-07-07 (×2): qty 1

## 2023-07-07 MED ORDER — DIPHENHYDRAMINE HCL 50 MG/ML IJ SOLN: 50.0000 mg | Freq: Once | INTRAMUSCULAR | Status: DC

## 2023-07-07 MED ORDER — GABAPENTIN 100 MG PO CAPS
200.0000 mg | ORAL_CAPSULE | Freq: Three times a day (TID) | ORAL | Status: DC
  Filled 2023-07-07: qty 2

## 2023-07-07 NOTE — Progress Notes (Incomplete)
 This RN went to patient room for her night time med pass.Patient states  I would like to have a white nurse in here to look on my face because my  face is red.This RN turned all lights on in patient

## 2023-07-07 NOTE — Progress Notes (Signed)
 CROSS COVER NOTE  NAME: Delania Ferg MRN: [id number] DOB : [birth year]    Concern as stated by nurse / staff   The Pt is stating that she needs to be seen by a Dr concerning the reaction that she has that is showing on her face and she does not want to take any other meds until she knows what is going on with her. Are you able to come and assess this Pt's face , I did not see anything but she does not want to take that from me and insists on seeing a DR.      Pertinent findings on chart review: Progress note reviewed with pertinent findings as below: Acute sigmoid diverticulitis: As seen on abdominal CT without complication.  Patient has significant list of allergies to medications precluding most first-line therapies. Thanks to their thorough review of medications tolerated in past and allergy list, pharmacy has recommended that patient can safely transition to levofloxacin  and metronidazole  when discharged to complete treatment as she has tolerated these well in the recent past without reaction   Patient is currently on meropenem , 1 g every 8 started on 6/19  Patient Assessment    07/07/2023   10:41 PM 07/07/2023    7:41 PM 07/07/2023    2:43 PM  Vitals with BMI  Systolic 157 149 853  Diastolic 80 91 85  Pulse 82 73 74   Patient evaluated at bedside. Upset that her concerns are not being addressed  Review of Systems  Respiratory:  Negative for shortness of breath and stridor.   Cardiovascular:  Positive for palpitations. Negative for chest pain and leg swelling.  All other systems reviewed and are negative.   Physical Exam Vitals and nursing note reviewed.  Constitutional:      General: She is not in acute distress. HENT:     Head: Normocephalic and atraumatic.   Cardiovascular:     Rate and Rhythm: Regular rhythm. Tachycardia present.     Heart sounds: Normal heart sounds.  Pulmonary:     Effort: Pulmonary effort is normal.     Breath sounds: Normal breath  sounds.  Abdominal:     Palpations: Abdomen is soft.     Tenderness: There is no abdominal tenderness.   Neurological:     Mental Status: Mental status is at baseline.   Psychiatric:     Comments: Somewhat anxious, somewhat upset       Assessment and  Interventions   Assessment:  Multiple antibiotic allergies (has tolerated Levaquin  and Flagyl  before) Acute sigmoid diverticulitis Bipolar affective disorder  Plan: I reconsulted pharmacist, Selinda Simpers who stated that patient has received Flagyl  and Levaquin  multiple times previously and tolerated it well Patient prefers Atarax  for itching (takes 25mg  at home so dose changed) Transition off meropenem  to Levaquin  and metronidazole  which patient has tolerated in the past --discussed medication change with patient and she is agreeable and appreciative X      CRITICAL CARE Performed by: Delayne LULLA Solian   Total critical care time: 35 minutes  Critical care time was exclusive of separately billable procedures and treating other patients.  Critical care was necessary to treat or prevent imminent or life-threatening deterioration.  Critical care was time spent personally by me on the following activities: development of treatment plan with patient and/or surrogate as well as nursing, discussions with consultants, evaluation of patient's response to treatment, examination of patient, obtaining history from patient or surrogate, ordering and performing  treatments and interventions, ordering and review of laboratory studies, ordering and review of radiographic studies, pulse oximetry and re-evaluation of patient's condition.

## 2023-07-07 NOTE — TOC Initial Note (Signed)
 Transition of Care [facility]) - Initial/Assessment Note    Patient Details  Name: Angela [name] MRN: [id number] Date of Birth: 03-24-1950  [facility]) CM/SW Contact:    Haizley Cannella E Abrish Erny, LCSW Phone Number: 07/07/2023, 11:48 AM  Clinical Narrative:                 CSW spoke with patient at length. Patient states she is unhoused and discussed her history of going to different facilities, shelters, etc. Patient states she has a walker and orthopedic shoes. Patient states she feels she needs short term rehab at a SNF after she discharges. Explained PT evals are pending. Inquired about plan following STR as the SNFs will want to know that she has a safe DC plan when she completes rehab, patient states she plans to stay at a boarding house and then [place]. Patient states she wants to move somwhere more friendly toward seniors that does not have as much mold as  she feels [place] has.    Expected Discharge Plan: Skilled Nursing Facility Barriers to Discharge: Continued Medical Work up   Patient Goals and CMS Choice   CMS Medicare.gov Compare Post Acute Care list provided to:: Patient Choice offered to / list presented to : Patient [place] ownership interest in [facility].provided to:: Patient    Expected Discharge Plan and Services       Living arrangements for the past 2 months: Homeless                                      Prior Living Arrangements/Services Living arrangements for the past 2 months: Homeless Lives with:: Self Patient language and need for interpreter reviewed:: Yes Do you feel safe going back to the place where you live?: Yes      Need for Family Participation in Patient Care: Yes (Comment) Care giver support system in place?: Yes (comment)   Criminal Activity/Legal Involvement Pertinent to Current Situation/Hospitalization: No - Comment as needed  Activities of Daily Living       Permission Sought/Granted Permission sought to share information with : Facility Industrial/product designer granted to share information with : Yes, Verbal Permission Granted              Emotional Assessment       Orientation: : Oriented to Self, Oriented to Place, Oriented to  Time, Oriented to Situation Alcohol  / Substance Use: Not Applicable Psych Involvement: No (comment)  Admission diagnosis:  Diverticulitis [K57.92] Patient Active Problem List   Diagnosis Date Noted   Diverticulitis 07/04/2023   Stress reaction causing mixed disturbance of emotion and conduct 09/10/2021   Homeless    Mixed hyperlipidemia 07/02/2019   Hypothyroidism due to acquired atrophy of thyroid  04/29/2019   Osteopenia determined by x-ray 04/29/2019   Acquired claw toe of both feet 10/23/2018   Aortic atherosclerosis (HCC) 05/31/2018   Achilles tendon disorder 04/25/2018   Diastolic dysfunction 03/29/2018   Mitral regurgitation 03/29/2018   Personality disorder (HCC) 12/25/2017   Arthritis of hand, degenerative 12/07/2017   Hallux valgus, acquired 07/05/2017   Hammer toes of both feet 07/05/2017   Unstable ankle 07/05/2017   Tinea unguium 05/08/2017   Conductive hearing loss of right ear with unrestricted hearing of left ear 04/03/2017   Adjustment disorder with depressed mood 01/18/2017   Migraine 01/18/2017   Irritable bowel  syndrome with constipation 11/17/2016   Bilateral carpal tunnel syndrome 11/17/2016   Plantar fasciitis, bilateral 11/17/2016   Cervical myofascial pain syndrome 11/17/2016   Chronic hyponatremia 11/17/2016   PTSD (post-traumatic stress disorder) 11/17/2016   Hiatal hernia 11/17/2016   Vitamin D  deficiency 11/17/2016   Vitamin B12 deficiency 11/17/2016   Cervical spondylosis with myelopathy 05/24/2016   MCI (mild cognitive impairment) with memory loss 03/30/2016   Gait abnormality 03/30/2016   Chronic bipolar affective disorder (HCC) 01/24/2016   Anemia,  iron deficiency 06/24/2015   Benign essential HTN 06/24/2015   History of falling 05/22/2015   History of TIA (transient ischemic attack) 04/20/2015   Gastroesophageal reflux disease with hiatal hernia 04/20/2015   Cervical disc disorder at C5-C6 level with radiculopathy 03/25/2015   Chronic neck pain 03/25/2015   Pelvic pain in female 10/30/2014   History of skin cancer 06/30/2014   Chronic headaches 12/16/2013   Neuropathy 12/16/2013   PCP:  Pcp, No Pharmacy:   [facility] [address] [place] - [address] [place] [address] Phone: [phone] Fax: [phone]  [facility]) - [place], [place] - [address] [facility] [address] [place] [address] [place]  [address] Phone: [phone] Fax: [phone]  [facility] - [place], [place] - [address] [place] [address] Phone: [phone] Fax: [phone]  [facility] [id number] [place], [place] - [address] [place] [address] Phone: [phone] Fax: [phone]  [facility] - [facility] [address] [place] [address] Phone: [phone] Fax: [phone]  [id number] - [place], [place] - [address] [place] [address] Phone: [phone] Fax: [phone]     Social Drivers of Health (SDOH) Social History: SDOH Screenings   Food Insecurity: Food Insecurity Present (07/04/2023)  Housing: High Risk (07/04/2023)  Transportation Needs: Unmet Transportation Needs (07/04/2023)  Utilities: At Risk (07/04/2023)  Alcohol  Screen: Low Risk  (06/19/2019)  Depression (PHQ2-9): Medium Risk (06/19/2019)  Financial Resource Strain: High Risk (02/13/2023)   [facility]  Physical Activity: Inactive (04/03/2022)   Received from [facility]  Social Connections: Socially Isolated  (07/04/2023)  Stress: Stress Concern Present (04/03/2022)   Received from [facility]  Tobacco Use: Medium Risk (07/03/2023)  Health Literacy: Low Risk  (07/11/2020)   Received from [facility]   SDOH Interventions: Food Insecurity Interventions: Inpatient TOC (Resources added to AVS) Housing Interventions: Inpatient TOC (Resources added to AVS) Transportation Interventions: Inpatient TOC (Resources added to AVS.) Utilities Interventions: Inpatient TOC (Resources added to AVS) Social Connections Interventions: Inpatient TOC (Resources added to AVS.)   Readmission Risk Interventions     No data to display

## 2023-07-07 NOTE — Evaluation (Signed)
 Physical Therapy Evaluation Patient Details Name: Angela [name] MRN: [id number] DOB: Dec 21, 1950 Today's Date: 07/07/2023  History of Present Illness  73 y/o female presented to ED on 07/04/23 for fever, lower abdominal pain, and back pain. Admitted for acute sigmoid diverticulitis. PMH: COPD, endometriosis, SIADH, TIA, PTSD, asthma, HTN, depression, housing insecurity  Clinical Impression  Patient admitted with the above. PTA, patient is homeless and was independent with no AD. Stating that [employer] is holding her rollator and orthopedic shoes. Patient aggravated at PT's presence throughout and stating I don't need PT to get into rehab. Patient modI for bed mobility x 3 during session although declined OOB mobility and becomes agitated. Patient endorsing she has been independently walking to/from bathroom while admitted. No further skilled PT needs identified acutely. No PT follow up recommended at this time.  PT will sign off.      Equipment Recommendations None recommended by PT  Recommendations for Other Services       Functional Status Assessment Patient has not had a recent decline in their functional status     Precautions / Restrictions Precautions Precautions: Fall Recall of Precautions/Restrictions: Intact Restrictions Weight Bearing Restrictions Per Provider Order: No      Mobility  Bed Mobility Overal bed mobility: Modified Independent       General bed mobility comments: performed supine<>sit 3 times during session    Transfers     General transfer comment: patient aggravated at PT's presence. Refused OOB, however has been ambulating independently to/from bathroom after long discussion. No bed alarm present      Balance Overall balance assessment: Independent           Pertinent Vitals/Pain Pain Assessment Pain Assessment: Faces Faces Pain Scale: Hurts whole lot Pain Location: B hands/feet Pain Descriptors / Indicators: Grimacing, Guarding Pain  Intervention(s): Limited activity within patient's tolerance, Monitored during session, Repositioned    Home Living Family/patient expects to be discharged to:: Shelter/Homeless       Prior Function Prior Level of Function : Independent/Modified Independent     Mobility Comments: modI with no AD. Claims [employer] is holding her rollator and orthopedic shoes       Extremity/Trunk Assessment   Upper Extremity Assessment Upper Extremity Assessment: Overall WFL for tasks assessed    Lower Extremity Assessment Lower Extremity Assessment: Overall WFL for tasks assessed       Communication   Communication Communication: No apparent difficulties    Cognition Arousal: Alert Behavior During Therapy: Anxious   PT - Cognitive impairments: No family/caregiver present to determine baseline       Following commands: Intact        Assessment/Plan    PT Assessment Patient does not need any further PT services  PT Problem List         PT Treatment Interventions      PT Goals (Current goals can be found in the Care Plan section)  Acute Rehab PT Goals Patient Stated Goal: to get some help to go to rehab but I do NOT need PT to get to rehab PT Goal Formulation: With patient     AM-PAC PT 6 Clicks Mobility  Outcome Measure Help needed turning from your back to your side while in a flat bed without using bedrails?: None Help needed moving from lying on your back to sitting on the side of a flat bed without using bedrails?: None Help needed moving to and from a bed to a chair (including a wheelchair)?: None Help  needed standing up from a chair using your arms (e.g., wheelchair or bedside chair)?: None Help needed to walk in hospital room?: None Help needed climbing 3-5 steps with a railing? : None 6 Click Score: 24    End of Session   Activity Tolerance: Patient tolerated treatment well Patient left: in bed;with call bell/phone within reach Nurse Communication:  Mobility status PT Visit Diagnosis: Muscle weakness (generalized) (M62.81)    Time: 9057-8995 PT Time Calculation (min) (ACUTE ONLY): 22 min   Charges:   PT Evaluation $PT Eval Low Complexity: 1 Low   PT General Charges $$ ACUTE PT VISIT: 1 Visit         Maryanne Finder, PT, DPT Physical Therapist - [facility]  [facility]   Demetrious Rainford A Latresha Yahr 07/07/2023, 1:23 PM

## 2023-07-07 NOTE — Plan of Care (Signed)

## 2023-07-07 NOTE — Progress Notes (Signed)
 PROGRESS NOTE Angela [name]    DOB: February 04, 1950, 73 y.o.  FMW:[id number]    Code Status: Full Code   DOA: 07/04/2023   LOS: 3  Brief hospital course  Angela [name] is a 73 y.o. female with a PMH significant for diverticulitis, endometriosis, inguinal hernias, TIA, GERD, hiatal hernia, anemia, HTN, asthma, PTSD, vitamin D  deficiency, arthritis, depression, hypothyroidism, housing insecurity. At baseline, they are homeless and are independent with their ADLs. Presents with uncomplicated sigmoid diverticulitis. Allergies limit treatment options so admitted for IV treatment. GI consulted per patient request. Social aspects determinant factor for dc. She is otherwise stable for dc.    In the ED, it was found that they had stable vital signs.  Significant findings included: LA 0.7, Na+ 134, K+ 3.6, Mg++ 2.1, glucose 107, creatinine 0.72.  Normal hepatic levels.  WBC 10.1, hemoglobin 11 and otherwise normal CBC.  Blood culture was collected.  Urinalysis not acutely positive for infection. CT abdomen pelvis: Acute, uncomplicated sigmoid diverticulitis without perforation or abscess.  Confirms large hiatal hernia. CT head negative for acute abnormalities.  Does show mild to moderate cerebral volume loss Chest x-ray: No acute intrathoracic process   They were initially treated with Zofran , meropenem , IV fluids.    07/07/23 -appears stable by objective measures (physical exam, lab results, vital signs, imaging), but complaining of clinical worsening. Additionally adds on complaints daily unrelated to presenting symptoms and without acute issues. Refuses recommended evaluations and therapies. I have concern that she lacks capacity for decision making. Consulted psychiatry to evaluate. Patient stated that she will not cooperate with their evaluation since she has medical problems and not mental problems. She alternates between agitation and tearfulness throughout encounters and is contentious with  recommendations or attempts to discuss her medical care.   Assessment & Plan  Principal Problem:   Diverticulitis  Acute sigmoid diverticulitis: As seen on abdominal CT without complication.  Patient has significant list of allergies to medications precluding most first-line therapies. Thanks to their thorough review of medications tolerated in past and allergy list, pharmacy has recommended that patient can safely transition to levofloxacin  and metronidazole  when discharged to complete treatment as she has tolerated these well in the recent past without reaction.  - continue Meropenem  as she continues to complain of significant symptoms. Had BM today without blood. Likely will transition to PO antibiotics tomorrow to complete course and could dc.  - Analgesia as needed -PRN morphine  as requested will be transitioned to PO options in order to optimize medication regimen for dc planning - Fluid diet ordered and patient refuses to advance her diet although I would be fine with advancing it at any point.  - encourage PO hydration.  - GI consulted per patient request- added dicyclomine  per patient request and no further intervention warranted at this time- has signed off.    Large hiatal hernia-describes significant weight loss over the past year - Consider general surgery referral for fundoplication evaluation - Antiemetics as needed, Carafate , PPI   Housing insecurity-patient would benefit from [facility] assistance but refused to talk with TOC when they stopped to talk to her. Patient states that she wants to stay at a rehab but refused to be seen by PT/OT for evaluation.    COPD-endorses nonspecific shortness of breath.  No hypoxia noted - Continue as needed inhalers - requests albuterol  nebs   H/o hypokalemia. K+ Within normal range but on lower end and patient demanded potassium supplement because she feels she needs a higher  level so 20mEq oral ordered x1. Her K+ repeat is 4.5.    Hypothyroidism- continue home levothyroxine   - TSH elevated which is to be expected as pt has not had medications in about a month. Will need outpatient follow up for recheck. Continuing on prior dose of levothyroxine  for now  Anxiety  depression- patient has poorly controlled anxiety and states she hasn't taken her medications in about a month - restart paxil  at lower dose to ramp up - atarax  PRN - psychiatry consulted, awaiting evaluation  HTN- poorly controlled as she has not been taking previously prescribed medications - add on home amlodipine  which appears to have good control  - can restart valsartan  if needed  Body mass index is 25.4 kg/m.  VTE ppx: enoxaparin  (LOVENOX ) injection 40 mg Start: 07/04/23 2200  Diet:     Diet   Diet clear liquid Fluid consistency: Thin   Consultants: GI  Subjective 07/07/23    Pt reports several complaints- she had infiltrated IV yesterday which was appropriately removed. Describes pins and needles from gout. Extensively she complains about her disposition and the care she is receiving and thinks no one is helping her. PT and myself in room providing care and active listening. She admits several times that she has forgotten conversations we have had, medications I have given her, and exams I have done.    Objective  Blood pressure 133/71, pulse 69, temperature 98.5 F (36.9 C), temperature source Oral, resp. rate 16, height 5' 2 (1.575 m), weight 63 kg, SpO2 99%.  Intake/Output Summary (Last 24 hours) at 07/07/2023 1404 Last data filed at 07/06/2023 1900 Gross per 24 hour  Intake 1100 ml  Output --  Net 1100 ml   Filed Weights   07/04/23 1105  Weight: 63 kg    Physical Exam:  General: awake, alert, agitated  HEENT: atraumatic, clear conjunctiva, anicteric sclera, MMM, hearing grossly normal Respiratory: normal respiratory effort. Cardiovascular: quick capillary refill, normal S1/S2, RRR, no JVD, murmurs Gastrointestinal:  soft, ND, no masses. No guarding or rebound although diffusely endorses pain to palpation.  Nervous: A&O x3. no gross focal neurologic deficits, normal speech. Able to follow commands Extremities: moves all equally, mild edema to R hand at site of infiltration which is painful to slightest touch.  Skin: dry, intact, normal temperature, normal color. No rashes, lesions or ulcers on exposed skin Psychiatry: anxious with pressured speech, flight of ideas. Easily distracted. Intermittently tearful.   Labs   I have personally reviewed the following labs and imaging studies CBC    Component Value Date/Time   WBC 7.6 07/06/2023 0448   RBC 3.66 (L) 07/06/2023 0448   HGB 11.1 (L) 07/06/2023 0448   HGB 12.3 12/05/2016 1330   HCT 33.7 (L) 07/06/2023 0448   HCT 37.1 12/05/2016 1330   PLT 254 07/06/2023 0448   PLT 448 (H) 12/05/2016 1330   MCV 92.1 07/06/2023 0448   MCV 94 12/05/2016 1330   MCV 94 04/16/2014 0519   MCH 30.3 07/06/2023 0448   MCHC 32.9 07/06/2023 0448   RDW 13.8 07/06/2023 0448   RDW 14.0 12/05/2016 1330   RDW 13.1 04/16/2014 0519   LYMPHSABS 1.3 07/04/2023 1150   LYMPHSABS 1.6 12/05/2016 1330   LYMPHSABS 0.9 (L) 04/16/2014 0519   MONOABS 1.0 07/04/2023 1150   MONOABS 0.6 04/16/2014 0519   EOSABS 0.2 07/04/2023 1150   EOSABS 0.1 12/05/2016 1330   EOSABS 0.1 04/16/2014 0519   BASOSABS 0.1 07/04/2023 1150   BASOSABS 0.0  12/05/2016 1330   BASOSABS 0.0 04/16/2014 0519      Latest Ref Rng & Units 07/06/2023    4:48 AM 07/05/2023    4:19 AM 07/04/2023   11:50 AM  BMP  Glucose 70 - 99 mg/dL 897   892   BUN 8 - 23 mg/dL 7   11   Creatinine 9.55 - 1.00 mg/dL 9.43  9.30  9.27   Sodium 135 - 145 mmol/L 136   134   Potassium 3.5 - 5.1 mmol/L 4.5   3.6   Chloride 98 - 111 mmol/L 105   102   CO2 22 - 32 mmol/L 26   23   Calcium  8.9 - 10.3 mg/dL 8.3   8.8    No results found.  Disposition Plan & Communication  Patient status: Inpatient  Admitted From: homeless  Planned  disposition location: TBD Anticipated discharge date: TBD pending completing IV Abx course, dispo  Family Communication: none    Author: Marien LITTIE Piety, DO [facility] 07/07/2023, 2:04 PM   Available by Epic secure chat 7AM-7PM. If 7PM-7AM, please contact night-coverage.  [facility] contact information found on [email or web address].

## 2023-07-07 NOTE — Progress Notes (Signed)
   07/07/23 1500  Spiritual Encounters  Type of Visit Initial  Care provided to: Patient  Conversation partners present during encounter Nurse  Referral source Patient request  Reason for visit Routine spiritual support  OnCall Visit No  Spiritual Framework  Presenting Themes Goals in life/care;Significant life change;Caregiving needs;Coping tools;Impactful experiences and emotions;Other (comment) (Patient states she has no family/friends in [place] and is scared she will end up back on the streets.)  Community/Connection None  Family Stress Factors Family relationships (Pt states the family has disowned her)  Interventions  Spiritual Care Interventions Made Established relationship of care and support;Compassionate presence;Reflective listening;Normalization of emotions;Decision-making support/facilitation;Narrative/life review;Prayer  Intervention Outcomes  Outcomes Connection to spiritual care;Awareness around self/spiritual resourses;Reduced anxiety;Reduced isolation  Spiritual Care Plan  Spiritual Care Issues Still Outstanding No further spiritual care needs at this time (see row info)   Pt requested chaplain to come by and prayer for her. Chaplain spent time listening to pts story, encouraging her and prayed with her.

## 2023-07-08 ENCOUNTER — Other Ambulatory Visit: Payer: Self-pay | Admitting: Student in an Organized Health Care Education/Training Program

## 2023-07-08 DIAGNOSIS — F603 Borderline personality disorder: Secondary | ICD-10-CM

## 2023-07-08 DIAGNOSIS — K5792 Diverticulitis of intestine, part unspecified, without perforation or abscess without bleeding: Secondary | ICD-10-CM | POA: Diagnosis not present

## 2023-07-08 LAB — BASIC METABOLIC PANEL WITH GFR
Anion gap: 8 (ref 5–15)
BUN: <5 mg/dL — ABNORMAL LOW (ref 8–23)
CO2: 26 mmol/L (ref 22–32)
Calcium: 8.6 mg/dL — ABNORMAL LOW (ref 8.9–10.3)
Chloride: 105 mmol/L (ref 98–111)
Creatinine, Ser: 0.48 mg/dL (ref 0.44–1.00)
GFR, Estimated: >60 mL/min (ref >60)
Glucose, Bld: 95 mg/dL (ref 70–99)
Potassium: 3.8 mmol/L (ref 3.5–5.1)
Sodium: 139 mmol/L (ref 135–145)

## 2023-07-08 LAB — CBC
HCT: 33.4 % — ABNORMAL LOW (ref 36.0–46.0)
Hemoglobin: 10.9 g/dL — ABNORMAL LOW (ref 12.0–15.0)
MCH: 29.7 pg (ref 26.0–34.0)
MCHC: 32.6 g/dL (ref 30.0–36.0)
MCV: 91 fL (ref 80.0–100.0)
Platelets: 311 10*3/uL (ref 150–400)
RBC: 3.67 MIL/uL — ABNORMAL LOW (ref 3.87–5.11)
RDW: 13.4 % (ref 11.5–15.5)
WBC: 4.6 10*3/uL (ref 4.0–10.5)
nRBC: 0 % (ref 0.0–0.2)

## 2023-07-08 MED ORDER — METRONIDAZOLE 500 MG PO TABS
500.0000 mg | ORAL_TABLET | Freq: Two times a day (BID) | ORAL | Status: DC
  Administered 2023-07-08 – 2023-07-09 (×2): 500 mg via ORAL
  Filled 2023-07-08 (×3): qty 1

## 2023-07-08 MED ORDER — LEVOFLOXACIN 750 MG PO TABS
750.0000 mg | ORAL_TABLET | Freq: Every day | ORAL | Status: DC
  Administered 2023-07-09: 750 mg via ORAL
  Filled 2023-07-08: qty 1

## 2023-07-08 MED ORDER — LEVOFLOXACIN IN D5W 500 MG/100ML IV SOLN
500.0000 mg | INTRAVENOUS | Status: DC
  Filled 2023-07-08: qty 100

## 2023-07-08 MED ORDER — HYDROXYZINE HCL 25 MG PO TABS
25.0000 mg | ORAL_TABLET | ORAL | Status: AC | PRN
  Administered 2023-07-08 (×2): 25 mg via ORAL
  Filled 2023-07-08 (×2): qty 1

## 2023-07-08 MED ORDER — PAROXETINE HCL 20 MG PO TABS
20.0000 mg | ORAL_TABLET | Freq: Every day | ORAL | 0 refills | Status: DC
  Filled 2023-07-09: qty 60, 60d supply, fill #0

## 2023-07-08 MED ORDER — VALSARTAN 320 MG PO TABS
320.0000 mg | ORAL_TABLET | Freq: Every day | ORAL | 0 refills | Status: DC
  Filled 2023-07-09: qty 60, 60d supply, fill #0

## 2023-07-08 MED ORDER — LEVOFLOXACIN 750 MG PO TABS
750.0000 mg | ORAL_TABLET | Freq: Every day | ORAL | 0 refills | Status: AC
  Filled 2023-07-09: qty 7, 7d supply, fill #0

## 2023-07-08 MED ORDER — DICYCLOMINE HCL 20 MG PO TABS
20.0000 mg | ORAL_TABLET | Freq: Three times a day (TID) | ORAL | 0 refills | Status: DC
  Filled 2023-07-09: qty 90, 30d supply, fill #0

## 2023-07-08 MED ORDER — HYDROXYZINE HCL 25 MG PO TABS
25.0000 mg | ORAL_TABLET | Freq: Two times a day (BID) | ORAL | 0 refills | Status: AC | PRN
  Filled 2023-07-09: qty 20, 10d supply, fill #0

## 2023-07-08 MED ORDER — METRONIDAZOLE 500 MG/100ML IV SOLN
500.0000 mg | Freq: Two times a day (BID) | INTRAVENOUS | Status: DC
  Administered 2023-07-08: 500 mg via INTRAVENOUS
  Filled 2023-07-08 (×3): qty 100

## 2023-07-08 MED ORDER — METRONIDAZOLE 500 MG PO TABS
500.0000 mg | ORAL_TABLET | Freq: Two times a day (BID) | ORAL | 0 refills | Status: AC
  Filled 2023-07-09: qty 6, 3d supply, fill #0

## 2023-07-08 MED ORDER — LEVOFLOXACIN IN D5W 500 MG/100ML IV SOLN
500.0000 mg | INTRAVENOUS | Status: DC
  Administered 2023-07-08: 500 mg via INTRAVENOUS
  Filled 2023-07-08: qty 100

## 2023-07-08 MED ORDER — HYDROXYZINE HCL 25 MG PO TABS
25.0000 mg | ORAL_TABLET | Freq: Three times a day (TID) | ORAL | Status: DC | PRN
  Administered 2023-07-08 (×2): 25 mg via ORAL
  Filled 2023-07-08 (×2): qty 1

## 2023-07-08 NOTE — Plan of Care (Signed)

## 2023-07-08 NOTE — Progress Notes (Signed)
 Called to the patient's room as charge nurse, patient stated that she is being directly targeted that she was given Gabapentin  for gout she has articles on her phone indicating the gabapentin  can cause respiratory distress in elderly. She requested her doctor be changed as she feels she is not safe in her doctors care. She states the doctor sent an [place] female psychiatrist into the room and she refused to speak to him stating she is familiar with how [place] men think about women and the sort of things they say to women in private She says she feels she is going to seek legal counsel because she has requested a change of doctors multiple times as well as she requested a consult with her pulmonologist and her GI team from the [facility] who are familiar with her care.   Angela [name] V Angela [name]

## 2023-07-08 NOTE — Plan of Care (Signed)

## 2023-07-08 NOTE — Progress Notes (Signed)
 Department leader spoke with patient regarding her concerns of care. 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  She stated she had no home and all her friends have died.  She included she has tried community resources but has been unsuccessful due to long wait times on the phone.  Attempted to explain to her about our community resources with our [facility] program and she responded, shut up because these resources don't work  She was complaining of a painful IV on her right hand which appeared red and puffy.  I explained to her that we could look at possibly another site to place an IV and she was agreeable to that. IV team notified of IV consult because she is a difficult stick.  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  Department leader expressed the intent of our plan of care for her is to provide safe care to her and to help her have a safe discharge.  She responded, I don't want to talk to you anymore, send me the chaplain. Department leader ensure patient had her call bell within reach before leaving patient's room and call was placed to the Chaplain.

## 2023-07-08 NOTE — Progress Notes (Addendum)
 Orders made for IV Benadryl  and oral blood pressure medication.Patient refused same,states she will not have any medication until she is seen by the Doctor.Provider notified.

## 2023-07-08 NOTE — Progress Notes (Signed)
 Chaplain receives page that pt has requested visit. Pt is distressed and says she was given a medication she's allergic to and begins listing numerous concerns with care being provided.She says she's filed an appeal to be discussed tomorrow. Chaplain offers to pray and pt agrees and begins praying aloud herself, becoming upset and requesting God's forgiveness and mercy. Chaplain affirms that God is forgiving and merciful and provides encouragement as well as a Investment banker, operational.

## 2023-07-08 NOTE — TOC Transition Note (Addendum)
 Transition of Care [facility]) - Discharge Note   Patient Details  Name: Angela [name] MRN: [id number] Date of Birth: 10/12/1950  [facility] CM/SW Contact:  Shanteria Laye E Kiira Brach, LCSW Phone Number: 07/08/2023, 2:50 PM   Clinical Narrative:    PT recommended no follow up. Psych recommended outpatient follow up.  Per MD, patient is medically stable to DC. CSW confirmed resources for PCP, social isolation, transportation, housing, and food are on patient's AVS. Added mental health resources to AVS.   3:35- CSW was asked to see if patient needs transportation assistance for her DC. Patient states she is not ready to DC. Patient states she did not work with PT yesterday, CSW explained that it is documented PT tried to evaluate her. Explained that there is not a skilled nursing need for STR. Patient requesting copies of her medical records. Advised patient to check My Chart or reach out to Medical Records as we can't provide these records directly. Patient became agitated stating she needs a new doctor, she feels she is being a victim of malpractice, and does not feel ready to discharge. Explained the option of appealing her discharge. Patient states she already tried to call [facility] (now [facility]) to appeal her discharge, but the call was screened by the hotel operator and she was unable to call. Advised patient to call [facility] ([facility]) from her personal phone or dial [phone] then 1 then the number on the room phone if she feels she would like to appeal her discharge. Patient states thank you Brandi Tomlinson, but you have to resolve this. This is not a legal discharge. Explained that the patient must call to start the discharge appeal, TOC is not allowed to do this for her. Patient began rambling stating I am a trained peer support person, I am a student of the law, I am someone who worked in [employer] and served the community. I was a front page news success story twice. I worked with and  advised [place]ell. The CDC director was my classmate. Patient then hung up the phone.  Unit director, [place], MD, [facility] Supervisor, and RN updated.   3:52- RN to assist patient with getting phone to work so she can call [facility] to appeal her discharge when available.  Final next level of care: Homeless Shelter Barriers to Discharge: Barriers Resolved   Patient Goals and CMS Choice   CMS Medicare.gov Compare Post Acute Care list provided to:: Patient Choice offered to / list presented to : Patient [place] ownership interest in [facility].provided to:: Patient    Discharge Placement                    Patient and family notified of of transfer: 07/08/23  Discharge Plan and Services Additional resources added to the After Visit Summary for                                       Social Drivers of Health (SDOH) Interventions SDOH Screenings   Food Insecurity: Food Insecurity Present (07/04/2023)  Housing: High Risk (07/04/2023)  Transportation Needs: Unmet Transportation Needs (07/04/2023)  Utilities: At Risk (07/04/2023)  Alcohol  Screen: Low Risk  (06/19/2019)  Depression (PHQ2-9): Medium Risk (06/19/2019)  Financial Resource Strain: High Risk (02/13/2023)   Received from [facility]  Physical Activity: Inactive (04/03/2022)   Received from [facility]  Social  Connections: Socially Isolated (07/04/2023)  Stress: Stress Concern Present (04/03/2022)   Received from [facility]  Tobacco Use: Medium Risk (07/03/2023)  Health Literacy: Low Risk  (07/11/2020)   Received from [facility]     Readmission Risk Interventions     No data to display

## 2023-07-08 NOTE — Discharge Summary (Addendum)
 Physician Discharge Summary  Patient: Angela [name] FMW:[id number] DOB: Feb 08, 1950   Code Status: Full Code Admit date: 07/04/2023 Discharge date: 07/08/2023 Disposition: Home, No home health services recommended PCP: Pcp, No  Recommendations for Outpatient Follow-up:  Follow up with PCP within 1-2 weeks Regarding general hospital follow up and preventative care Review medications and modify/refill as needed Consider psychiatry referral   Discharge Diagnoses:  Principal Problem:   Diverticulitis  Brief Hospital Course Summary: Angela [name] is a 73 y.o. female with a PMH significant for diverticulitis, endometriosis, inguinal hernias, TIA, GERD, hiatal hernia, anemia, HTN, asthma, PTSD, vitamin D  deficiency, arthritis, depression, hypothyroidism, housing insecurity. At baseline, they are homeless and are independent with their ADLs. Presents with uncomplicated sigmoid diverticulitis as seen on CT abd/pelvis. Allergies limit treatment options so admitted for IV treatment. GI consulted per patient request who agreed with plan and signed off. Social aspects determinant factor for dc. She is otherwise medically stable for dc. She has a benign abdominal exam, tolerating oral diet, having regular bowel movements, ambulatory, afebrile, has no leukocytosis, and can complete oral antibiotic course.    Upon arrival: vitals and labs overall unremarkable.  CT abdomen pelvis: Acute, uncomplicated sigmoid diverticulitis without perforation or abscess.  Confirms large hiatal hernia previously present. CT head negative for acute abnormalities.  Does show mild to moderate cerebral volume loss Chest x-ray: No acute intrathoracic process   They were initially treated with Zofran , meropenem , IV fluids, and IV morphine .    Currently appears stable by objective measures (physical exam, lab results, vital signs, imaging). She is refusing antibiotics intermittently. Did receive at least 4 days IV  Abx. Plan for completion of antibiotic course with levofloxacin  and flagyl  which is confirmed with pharmacy and patient that she has recently taken these and tolerated well. Blood cultures are negative x4 days. She has refused multiple nurses care for various reasons including their sex, race.  Psychiatry was consulted to evaluate for poorly managed symptoms of anxiety, depression and has history of diagnosis of borderline and bipolar but patient denies these. Patient refused to speak with psychiatrist but did deny SI. Summary of psychiatry recommendations: Will recommend Consider mood stabilizer like lithium, Depakote , Lamictal and some antipsychotic medication.  Patient refused to take any psychiatric medication   And at this time does not meet the criteria for IVC or nonemergency forced medication.   If patient is agreeable recommend to start patient on Seroquel  200 mg q.h.s. and Depakote  500 mg b.I.d..  Patient is refusing to take this medication at this time.     Will recommend patient to have DBT therapy on outpatient basis.   Continue Paxil  for now as patient is refusing any other medication at this time.   For acute agitation aggression recommend Zyprexa  10 mg Q 12 hourly as needed p.o./IM. avoid Haldol  and risperidone as it is reported  as allergies.  She has refused attending care as well today. She has expressed significant paranoia for care from all those she has come into contact with.  PT were consulted to evaluate patient for need of rehab which she requested but refused to submit to an evaluation. She was observed to be independent with ambulation, feeding, and dressing, hygiene so wouldn't qualify for acute rehab.  Several members from [facility] team have attempted to talk with patient and offer her resources and she has refused them.  She has been seen by chaplain for spiritual support.   Patient is medically stable and has  been offered every resource available to hospital staff to give  and refuses further care from this attending. She is discharged. Chronic and pertinent medications have been prescribed. Resource information is included in AVS.   Discharge Condition: Stable, improved Recommended discharge diet: Regular healthy diet  Consultations: GI Psychiatry   Procedures/Studies: None   Allergies as of 07/08/2023       Reactions   Amoxicillin -pot Clavulanate Anaphylaxis, Hives   Aspirin Hives   Bee Venom Hives   Spider bites cause hives Spider bites cause hives   Cephalexin  Hives, Other (See Comments)   Can not take cephalexin  but stated tolerated cefpodoxime prescribed 02/2022   Ciprofloxacin  Anaphylaxis, Swelling   Per documentation in 2024 - patient has taken levofloxacin .  Patient confirmed can take levofloxacin  on 07/06/2023   Fluticasone Shortness Of Breath   Haloperidol  Anaphylaxis, Nausea And Vomiting, Other (See Comments)   Serotonin syndrome   Molds & Smuts Shortness Of Breath, Itching   Eyes-itchy   Neomycin-polymyxin-dexameth Swelling   Eye swelling   Other Anaphylaxis, Hives, Itching, Swelling, Rash   Spider bites cause hives Hives/throat swelling   Peanuts [peanut Oil] Anaphylaxis, Hives   Penicillins Anaphylaxis, Hives   Propoxyphene Anaphylaxis, Hives   Hives/throat swelling   Sulfa Antibiotics Anaphylaxis, Hives, Itching   Sulfacetamide Sodium Anaphylaxis, Hives, Itching   Tramadol Hives, Other (See Comments)   Other reaction(s): Unknown   Amikacin Nausea And Vomiting, Other (See Comments)   Bee Pollen Other (See Comments)   Other reaction(s): Unknown   Imipramine Hives   Reaction to Tofranil   Oxycodone Nausea And Vomiting   Hallucinations   Prednisone Nausea And Vomiting, Anxiety, Other (See Comments)   Crazy mood swings, strange thoughts. Tolerates depomedrol, dexamethasone . States can't do prednisone but other corticosteroids ok   Colchicine  Other (See Comments)   Reports that it interacts badly with my paxil . Unable to  describe exactly what reaction is.   Hymenoptera Venom Preparations Hives   Reaction to spider bites   Lactose Intolerance (gi)    Peanut-containing Drug Products    Phthalylsulfacetamide    Pine Swelling   Pine tree pollen   Pregabalin Other (See Comments)   Risperidone Other (See Comments)   Serotonin syndrome   Sulfacetamide    Wound Dressing Adhesive    Adhesive [tape] Hives, Rash   Losartan     Patient states she can not take losartan  as interacts with her underlying conditions.  Other ARBs are OK.     Vistaril  [hydroxyzine  Pamoate] Itching, Anxiety   Can NOT take Vistaril  but CAN TAKE Atarax  (takes atarax  daily)        Medication List     STOP taking these medications    azithromycin  250 MG tablet Commonly known as: ZITHROMAX    Cholecalciferol  100 MCG (4000 UT) Caps   CITRACAL PO   fluconazole  150 MG tablet Commonly known as: DIFLUCAN    hydrALAZINE  25 MG tablet Commonly known as: APRESOLINE    HYDROcodone -acetaminophen  5-325 MG tablet Commonly known as: NORCO/VICODIN   ivermectin 3 MG Tabs tablet Commonly known as: STROMECTOL       TAKE these medications    acetaminophen  650 MG CR tablet Commonly known as: TYLENOL  Take 650 mg by mouth every 8 (eight) hours as needed for pain.   albuterol  108 (90 Base) MCG/ACT inhaler Commonly known as: VENTOLIN  HFA albuterol  sulfate HFA 90 mcg/actuation aerosol inhaler  INHALE 2 PUFFS BY MOUTH EVERY 4 HOURS AS NEEDED   allopurinol  100 MG tablet Commonly known as: ZYLOPRIM   Take 1 tablet (100 mg total) by mouth daily.   allopurinol  100 MG tablet Commonly known as: Zyloprim  Take 1 tablet (100 mg total) by mouth daily.   amLODipine  5 MG tablet Commonly known as: NORVASC  Take 1 tablet (5 mg total) by mouth daily.   ascorbic acid  1000 MG tablet Commonly known as: VITAMIN C  Take 2,000 mg by mouth daily.   vitamin C  250 MG tablet Commonly known as: ASCORBIC ACID  Take 1 tablet (250 mg total) by mouth daily.    benzonatate  100 MG capsule Commonly known as: Tessalon  Perles Take 1 capsule (100 mg total) by mouth 3 (three) times daily as needed for cough.   calcium  carbonate 1500 (600 Ca) MG Tabs tablet Commonly known as: OSCAL Calcium  600 mg calcium  (1,500 mg) tablet  Take 1 tablet 3 times a day by oral route.   colchicine  0.6 MG tablet Take 2 tablets ( 1.2 mg ) then in 1 hour take 1 tablet ( 0.6 mg),  then take 1 tablet ( 0.6 mg ) daily for 6 days   diclofenac  Sodium 1 % Gel Commonly known as: VOLTAREN  Apply 4 g topically 4 (four) times daily.   dicyclomine  20 MG tablet Commonly known as: BENTYL  Take 1 tablet (20 mg total) by mouth 3 (three) times daily before meals. What changed: when to take this   Ensure Plant-Based Protein Liqd Take 1 Units by mouth as needed.   EPINEPHrine  0.3 mg/0.3 mL Soaj injection Commonly known as: EPI-PEN Inject into the skin.   estradiol  0.1 MG/GM vaginal cream Commonly known as: ESTRACE  Place 1 Applicatorful vaginally daily.   famotidine  20 MG tablet Commonly known as: PEPCID  Take 1 tablet (20 mg total) by mouth 2 (two) times daily as needed for heartburn or indigestion.   hydrOXYzine  25 MG tablet Commonly known as: ATARAX  Take 1 tablet (25 mg total) by mouth 2 (two) times daily as needed for anxiety. What changed:  when to take this reasons to take this Another medication with the same name was removed. Continue taking this medication, and follow the directions you see here.   levalbuterol  1.25 MG/3ML nebulizer solution Commonly known as: XOPENEX  Take 3 mLs by nebulization every 6 (six) hours as needed for wheezing.   levofloxacin  750 MG tablet Commonly known as: Levaquin  Take 1 tablet (750 mg total) by mouth daily for 3 days.   levothyroxine  112 MCG tablet Commonly known as: Synthroid  Take 1 tablet (112 mcg total) by mouth daily.   lidocaine  4 %   LORazepam  1 MG tablet Commonly known as: ATIVAN  Take 1 mg by mouth every 4 (four)  hours as needed for sleep.   Magnesium  500 MG Tabs Take 500 mg by mouth every other day.   metroNIDAZOLE  500 MG tablet Commonly known as: FLAGYL  Take 1 tablet (500 mg total) by mouth 2 (two) times daily for 3 days.   mupirocin  ointment 2 % Commonly known as: BACTROBAN  Apply 1 application  topically 3 (three) times daily.   NAC 500 MG Caps Generic drug: Acetylcysteine Take 500 mg by mouth 2 (two) times a day.   ondansetron  4 MG disintegrating tablet Commonly known as: ZOFRAN -ODT Take 1 tablet (4 mg total) by mouth every 8 (eight) hours as needed for nausea or vomiting.   pantoprazole  40 MG tablet Commonly known as: Protonix  Take 1 tablet (40 mg total) by mouth daily. What changed: Another medication with the same name was removed. Continue taking this medication, and follow the directions you see here.  PARoxetine  20 MG tablet Commonly known as: PAXIL  Take 1 tablet (20 mg total) by mouth daily.   prednisoLONE acetate 1 % ophthalmic suspension Commonly known as: PRED FORTE   pyridoxine  100 MG tablet Commonly known as: B-6 Take 1 tablet (100 mg total) by mouth 2 (two) times daily. What changed:  how much to take when to take this   Senokot Laxative Tea Bag Misc   sucralfate  1 GM/10ML suspension Commonly known as: Carafate  Take 10 mLs (1 g total) by mouth 4 (four) times daily.   terconazole  0.4 % vaginal cream Commonly known as: TERAZOL 7    Tiotropium Bromide  Monohydrate 2.5 MCG/ACT Aers Inhale into the lungs.   traZODone  50 MG tablet Commonly known as: DESYREL  Take 0.5 tablets (25 mg total) by mouth at bedtime.   valsartan  320 MG tablet Commonly known as: DIOVAN  Take 1 tablet (320 mg total) by mouth daily.   Vitamin B-12 1000 MCG Subl Place 1,000 mcg under the tongue.   Vitamin D  (Ergocalciferol ) 1.25 MG (50000 UNIT) Caps capsule Commonly known as: DRISDOL  Take 1 capsule (50,000 Units total) by mouth every 7 (seven) days. What changed: how much to  take   Xiidra 5 % Soln Generic drug: Lifitegrast Place 1 drop into both eyes 2 (two) times daily.        Follow-up Information     PCP. Schedule an appointment as soon as possible for a visit.   Why: Some PCP options in [place] area- not a comprehensive list   [facility]- [phone] [facility]- [phone] [facility]- [phone] [facility]- [phone] [facility]- [phone] Nichole Molly- [phone]   or [facility] Physician Referral Line [phone]               Subjective   Pt reports that she will not allow me to examine her nor converse with me. She says, end of discussion and then proceeds to pretend to call a senator to request a new doctor. She does not respond to me any further. She appears comfortable with normal speech and moving spontaneously with normal respiration.    Objective  Blood pressure (!) 132/93, pulse (!) 51, temperature 98.8 F (37.1 C), temperature source Oral, resp. rate 16, height 5' 2 (1.575 m), weight 63 kg, SpO2 94%.   General: Pt is alert, awake, not in acute distress Cardiovascular: good color in all extremities, face indicating good perfusion Respiratory: normal respiratory effort and conversing without difficulty Skin: no lesions on exposed skin Extremities: no edema, no cyanosis. Moving all spontaneously   The results of significant diagnostics from this hospitalization (including imaging, microbiology, ancillary and laboratory) are listed below for reference.   Imaging studies: CT ABDOMEN PELVIS W CONTRAST Result Date: 07/04/2023 CLINICAL DATA:  Abdominal pain, fever and chills, lower back pain EXAM: CT ABDOMEN AND PELVIS WITH CONTRAST TECHNIQUE: Multidetector CT imaging of the abdomen and pelvis was performed using the standard protocol following bolus administration of intravenous contrast. RADIATION DOSE REDUCTION: This exam was performed according to the departmental dose-optimization  program which includes automated exposure control, adjustment of the mA and/or kV according to patient size and/or use of iterative reconstruction technique. CONTRAST:  OMNIPAQUE  IOHEXOL  300 MG/ML  SOLN COMPARISON:  10/15/2015 FINDINGS: Lower chest: Large hiatal hernia with the majority of the stomach in an intrathoracic location. No acute pleural or parenchymal lung disease. Hepatobiliary: No focal liver abnormality is seen. Status post cholecystectomy. No biliary dilatation. Pancreas: Unremarkable. No pancreatic ductal dilatation or surrounding inflammatory changes. Spleen: Normal  in size without focal abnormality. Adrenals/Urinary Tract: Kidneys enhance normally and symmetrically. No urinary tract calculi or obstructive uropathy within either kidney. Marked distension of the urinary bladder. No filling defects. Stomach/Bowel: No bowel obstruction or ileus. There is diverticulosis of the descending and sigmoid colon, with long segment mural thickening and pericolonic fat stranding within the mid sigmoid colon consistent with acute diverticulitis. No evidence of perforation, fluid collection, or abscess. Vascular/Lymphatic: Aortic atherosclerosis. No enlarged abdominal or pelvic lymph nodes. Reproductive: Stable 4.0 x 2.9 cm right adnexal cyst, previously evaluated by ultrasound and compatible with benign cyst. Uterus and left adnexa are unremarkable. Other: Trace pelvic free fluid. No free intraperitoneal gas. No abdominal wall hernia. Musculoskeletal: No acute or destructive bony abnormalities. Reconstructed images demonstrate no additional findings. IMPRESSION: 1. Acute uncomplicated sigmoid diverticulitis. No perforation, fluid collection, or abscess. 2. Marked distension of the urinary bladder. 3. Trace pelvic free fluid. 4. Large hiatal hernia, with the majority of the stomach in an intrathoracic location. 5.  Aortic Atherosclerosis (ICD10-I70.0). Electronically Signed   By: Ozell Daring M.D.   On:  07/04/2023 15:34   CT HEAD WO CONTRAST ( ) Result Date: 07/04/2023 CLINICAL DATA:  Head trauma, minor. EXAM: CT HEAD WITHOUT CONTRAST TECHNIQUE: Contiguous axial images were obtained from the base of the skull through the vertex without intravenous contrast. RADIATION DOSE REDUCTION: This exam was performed according to the departmental dose-optimization program which includes automated exposure control, adjustment of the mA and/or kV according to patient size and/or use of iterative reconstruction technique. COMPARISON:  CT of the head dated May 05, 2023. FINDINGS: Brain: Mild-to-moderate age-related cerebral volume loss. Chronic lacunar infarct within the anterior limb of the right internal capsule. No evidence of acute intracranial injury. No evidence of hemorrhage, mass or hydrocephalus. Vascular: Vascular calcifications within the carotid siphons. Skull: Intact and unremarkable.  No lesions are present. Sinuses/Orbits: Normal. Other: None. IMPRESSION: 1. Mild to moderate cerebral volume loss and chronic lacunar infarct within the anterior limb of the right internal capsule. No apparent acute process. Electronically Signed   By: Evalene Coho M.D.   On: 07/04/2023 15:23   DG Chest Port 1 View Result Date: 07/04/2023 CLINICAL DATA:  Back pain, febrile, sepsis EXAM: PORTABLE CHEST 1 VIEW COMPARISON:  06/30/2021 FINDINGS: Single frontal view of the chest demonstrates a stable cardiac silhouette. Large hiatal hernia again noted. No acute airspace disease, effusion, or pneumothorax. There are no acute bony abnormalities. IMPRESSION: 1. No acute intrathoracic process.  Stable hiatal hernia. Electronically Signed   By: Ozell Daring M.D.   On: 07/04/2023 14:25    Labs: Basic Metabolic Panel: Recent Labs  Lab 07/04/23 1150 07/05/23 0419 07/06/23 0448 07/08/23 0815  NA 134*  --  136 139  K 3.6  --  4.5 3.8  CL 102  --  105 105  CO2 23  --  26 26  GLUCOSE 107*  --  102* 95  BUN 11  --  7*  <5*  CREATININE 0.72 0.69 0.56 0.48  CALCIUM  8.8*  --  8.3* 8.6*  MG 2.1  --   --   --    CBC: Recent Labs  Lab 07/04/23 1150 07/06/23 0448 07/08/23 0815  WBC 10.1 7.6 4.6  NEUTROABS 7.6  --   --   HGB 11.0* 11.1* 10.9*  HCT 33.3* 33.7* 33.4*  MCV 92.0 92.1 91.0  PLT 257 254 311   Microbiology: Results for orders placed or performed during the hospital encounter of 07/04/23  Blood Culture (routine x 2)     Status: None (Preliminary result)   Collection Time: 07/04/23 11:50 AM   Specimen: BLOOD  Result Value Ref Range Status   Specimen Description BLOOD BLOOD RIGHT FOREARM  Final   Special Requests   Final    BOTTLES DRAWN AEROBIC AND ANAEROBIC Blood Culture results may not be optimal due to an inadequate volume of blood received in culture bottles   Culture   Final    NO GROWTH 4 DAYS Performed at [facility], [address] [place], [place] [address]    Report Status PENDING  Incomplete  Resp panel by RT-PCR (RSV, Flu A&B, Covid) Anterior Nasal Swab     Status: None   Collection Time: 07/04/23 11:50 AM   Specimen: Anterior Nasal Swab  Result Value Ref Range Status   SARS Coronavirus 2 by RT PCR NEGATIVE NEGATIVE Final    Comment: (NOTE) SARS-CoV-2 target nucleic acids are NOT DETECTED.  The SARS-CoV-2 RNA is generally detectable in upper respiratory specimens during the acute phase of infection. The lowest concentration of SARS-CoV-2 viral copies this assay can detect is 138 copies/mL. A negative result does not preclude SARS-Cov-2 infection and should not be used as the sole basis for treatment or other patient management decisions. A negative result may occur with  improper specimen collection/handling, submission of specimen other than nasopharyngeal swab, presence of viral mutation(s) within the areas targeted by this assay, and inadequate number of viral copies(<138 copies/mL). A negative result must be combined with clinical observations, patient  history, and epidemiological information. The expected result is Negative.  Fact Sheet for Patients:  BloggerCourse.com  Fact Sheet for Healthcare Providers:  SeriousBroker.it  This test is no t yet approved or cleared by the [place]  FDA and  has been authorized for detection and/or diagnosis of SARS-CoV-2 by FDA under an Emergency Use Authorization (EUA). This EUA will remain  in effect (meaning this test can be used) for the duration of the COVID-19 declaration under Section 564(b)(1) of the Act, 21 U.S.C.section 360bbb-3(b)(1), unless the authorization is terminated  or revoked sooner.       Influenza A by PCR NEGATIVE NEGATIVE Final   Influenza B by PCR NEGATIVE NEGATIVE Final    Comment: (NOTE) The Xpert Xpress SARS-CoV-2/FLU/RSV plus assay is intended as an aid in the diagnosis of influenza from Nasopharyngeal swab specimens and should not be used as a sole basis for treatment. Nasal washings and aspirates are unacceptable for Xpert Xpress SARS-CoV-2/FLU/RSV testing.  Fact Sheet for Patients: BloggerCourse.com  Fact Sheet for Healthcare Providers: SeriousBroker.it  This test is not yet approved or cleared by the [place]  FDA and has been authorized for detection and/or diagnosis of SARS-CoV-2 by FDA under an Emergency Use Authorization (EUA). This EUA will remain in effect (meaning this test can be used) for the duration of the COVID-19 declaration under Section 564(b)(1) of the Act, 21 U.S.C. section 360bbb-3(b)(1), unless the authorization is terminated or revoked.     Resp Syncytial Virus by PCR NEGATIVE NEGATIVE Final    Comment: (NOTE) Fact Sheet for Patients: BloggerCourse.com  Fact Sheet for Healthcare Providers: SeriousBroker.it  This test is not yet approved or cleared by the [place]  FDA  and has been authorized for detection and/or diagnosis of SARS-CoV-2 by FDA under an Emergency Use Authorization (EUA). This EUA will remain in effect (meaning this test can be used) for the duration of the COVID-19 declaration under Section 564(b)(1) of the Act,  21 U.S.C. section 360bbb-3(b)(1), unless the authorization is terminated or revoked.  Performed at [facility], [address] [place], [place] [address]   Blood Culture (routine x 2)     Status: None (Preliminary result)   Collection Time: 07/04/23 12:19 PM   Specimen: BLOOD  Result Value Ref Range Status   Specimen Description BLOOD BLOOD RIGHT HAND  Final   Special Requests   Final    BOTTLES DRAWN AEROBIC AND ANAEROBIC Blood Culture results may not be optimal due to an inadequate volume of blood received in culture bottles   Culture   Final    NO GROWTH 4 DAYS Performed at [facility], [address] [place], [place] [address]    Report Status PENDING  Incomplete   *Note: Due to a large number of results and/or encounters for the requested time period, some results have not been displayed. A complete set of results can be found in Results Review.    Time coordinating discharge: Over 30 minutes  Marien LITTIE Piety, MD  [facility] 07/08/2023, 2:48 PM

## 2023-07-08 NOTE — Progress Notes (Incomplete)
 PROGRESS NOTE Angela [name]    DOB: 1950-07-22, 74 y.o.  FMW:[id number]    Code Status: Full Code   DOA: 07/04/2023   LOS: 4  Brief hospital course  Kaijah Abts is a 73 y.o. female with a PMH significant for diverticulitis, endometriosis, inguinal hernias, TIA, GERD, hiatal hernia, anemia, HTN, asthma, PTSD, vitamin D  deficiency, arthritis, depression, hypothyroidism, housing insecurity. At baseline, they are homeless and are independent with their ADLs. Presents with uncomplicated sigmoid diverticulitis. Allergies limit treatment options so admitted for IV treatment. GI consulted per patient request. Social aspects determinant factor for dc. She is otherwise stable for dc.    In the ED, it was found that they had stable vital signs.  Significant findings included: LA 0.7, Na+ 134, K+ 3.6, Mg++ 2.1, glucose 107, creatinine 0.72.  Normal hepatic levels.  WBC 10.1, hemoglobin 11 and otherwise normal CBC.  Blood culture was collected.  Urinalysis not acutely positive for infection. CT abdomen pelvis: Acute, uncomplicated sigmoid diverticulitis without perforation or abscess.  Confirms large hiatal hernia. CT head negative for acute abnormalities.  Does show mild to moderate cerebral volume loss Chest x-ray: No acute intrathoracic process   They were initially treated with Zofran , meropenem , IV fluids.    07/08/23 -appears stable by objective measures (physical exam, lab results, vital signs, imaging), but complaining of clinical worsening. Additionally adds on complaints daily unrelated to presenting symptoms and without acute issues. Refuses recommended evaluations and therapies. I have concern that she lacks capacity for decision making. Consulted psychiatry to evaluate. Patient stated that she will not cooperate with their evaluation since she has medical problems and not mental problems. She alternates between agitation and tearfulness throughout encounters and is contentious with  recommendations or attempts to discuss her medical care.   Assessment & Plan  Principal Problem:   Diverticulitis  Acute sigmoid diverticulitis: As seen on abdominal CT without complication.  Patient has significant list of allergies to medications precluding most first-line therapies. Thanks to their thorough review of medications tolerated in past and allergy list, pharmacy has recommended that patient can safely transition to levofloxacin  and metronidazole  when discharged to complete treatment as she has tolerated these well in the recent past without reaction.  - continue Meropenem  as she continues to complain of significant symptoms. Had BM today without blood. Likely will transition to PO antibiotics tomorrow to complete course and could dc.  - Analgesia as needed -PRN morphine  as requested will be transitioned to PO options in order to optimize medication regimen for dc planning - Fluid diet ordered and patient refuses to advance her diet although I would be fine with advancing it at any point.  - encourage PO hydration.  - GI consulted per patient request- added dicyclomine  per patient request and no further intervention warranted at this time- has signed off.    Large hiatal hernia-describes significant weight loss over the past year - Consider general surgery referral for fundoplication evaluation - Antiemetics as needed, Carafate , PPI   Housing insecurity-patient would benefit from [facility] assistance but refused to talk with TOC when they stopped to talk to her. Patient states that she wants to stay at a rehab but refused to be seen by PT/OT for evaluation.    COPD-endorses nonspecific shortness of breath.  No hypoxia noted - Continue as needed inhalers - requests albuterol  nebs   H/o hypokalemia. K+ Within normal range but on lower end and patient demanded potassium supplement because she feels she needs a higher  level so 20mEq oral ordered x1. Her K+ repeat is 4.5.    Hypothyroidism- continue home levothyroxine   - TSH elevated which is to be expected as pt has not had medications in about a month. Will need outpatient follow up for recheck. Continuing on prior dose of levothyroxine  for now  Anxiety  depression- patient has poorly controlled anxiety and states she hasn't taken her medications in about a month - restart paxil  at lower dose to ramp up - atarax  PRN - psychiatry consulted, awaiting evaluation  HTN- poorly controlled as she has not been taking previously prescribed medications - add on home amlodipine  which appears to have good control  - can restart valsartan  if needed  Body mass index is 25.4 kg/m.  VTE ppx: enoxaparin  (LOVENOX ) injection 40 mg Start: 07/04/23 2200  Diet:     Diet   Diet full liquid Room service appropriate? Yes; Fluid consistency: Thin   Consultants: GI  Subjective 07/08/23    Pt reports several complaints- she had infiltrated IV yesterday which was appropriately removed. Describes pins and needles from gout. Extensively she complains about her disposition and the care she is receiving and thinks no one is helping her. PT and myself in room providing care and active listening. She admits several times that she has forgotten conversations we have had, medications I have given her, and exams I have done.    Objective  Blood pressure 133/71, pulse 69, temperature 98.5 F (36.9 C), temperature source Oral, resp. rate 16, height 5' 2 (1.575 m), weight 63 kg, SpO2 99%. No intake or output data in the 24 hours ending 07/08/23 0750  Filed Weights   07/04/23 1105  Weight: 63 kg    Physical Exam:  General: awake, alert, agitated  HEENT: atraumatic, clear conjunctiva, anicteric sclera, MMM, hearing grossly normal Respiratory: normal respiratory effort. Cardiovascular: quick capillary refill, normal S1/S2, RRR, no JVD, murmurs Gastrointestinal: soft, ND, no masses. No guarding or rebound although diffusely  endorses pain to palpation.  Nervous: A&O x3. no gross focal neurologic deficits, normal speech. Able to follow commands Extremities: moves all equally, mild edema to R hand at site of infiltration which is painful to slightest touch.  Skin: dry, intact, normal temperature, normal color. No rashes, lesions or ulcers on exposed skin Psychiatry: anxious with pressured speech, flight of ideas. Easily distracted. Intermittently tearful.   Labs   I have personally reviewed the following labs and imaging studies CBC    Component Value Date/Time   WBC 7.6 07/06/2023 0448   RBC 3.66 (L) 07/06/2023 0448   HGB 11.1 (L) 07/06/2023 0448   HGB 12.3 12/05/2016 1330   HCT 33.7 (L) 07/06/2023 0448   HCT 37.1 12/05/2016 1330   PLT 254 07/06/2023 0448   PLT 448 (H) 12/05/2016 1330   MCV 92.1 07/06/2023 0448   MCV 94 12/05/2016 1330   MCV 94 04/16/2014 0519   MCH 30.3 07/06/2023 0448   MCHC 32.9 07/06/2023 0448   RDW 13.8 07/06/2023 0448   RDW 14.0 12/05/2016 1330   RDW 13.1 04/16/2014 0519   LYMPHSABS 1.3 07/04/2023 1150   LYMPHSABS 1.6 12/05/2016 1330   LYMPHSABS 0.9 (L) 04/16/2014 0519   MONOABS 1.0 07/04/2023 1150   MONOABS 0.6 04/16/2014 0519   EOSABS 0.2 07/04/2023 1150   EOSABS 0.1 12/05/2016 1330   EOSABS 0.1 04/16/2014 0519   BASOSABS 0.1 07/04/2023 1150   BASOSABS 0.0 12/05/2016 1330   BASOSABS 0.0 04/16/2014 0519      Latest Ref  Rng & Units 07/06/2023    4:48 AM 07/05/2023    4:19 AM 07/04/2023   11:50 AM  BMP  Glucose 70 - 99 mg/dL 897   892   BUN 8 - 23 mg/dL 7   11   Creatinine 9.55 - 1.00 mg/dL 9.43  9.30  9.27   Sodium 135 - 145 mmol/L 136   134   Potassium 3.5 - 5.1 mmol/L 4.5   3.6   Chloride 98 - 111 mmol/L 105   102   CO2 22 - 32 mmol/L 26   23   Calcium  8.9 - 10.3 mg/dL 8.3   8.8    No results found.  Disposition Plan & Communication  Patient status: Inpatient  Admitted From: homeless  Planned disposition location: TBD Anticipated discharge date: TBD pending  completing IV Abx course, dispo  Family Communication: none    Author: Marien LITTIE Piety, DO [facility] 07/08/2023, 7:50 AM   Available by Epic secure chat 7AM-7PM. If 7PM-7AM, please contact night-coverage.  [facility] contact information found on [email or web address].

## 2023-07-08 NOTE — Consult Note (Signed)
 [facility] Face-to-Face Psychiatry Consult   Reason for Consult:  Capacity,   Medication review and recommendations.  Consider inpatient BH Referring Physician:  LENON MARIEN CROME  Patient Identification: Angela [name] MRN:  [id number] Principal Diagnosis: Diverticulitis Diagnosis:  Principal Problem:   Diverticulitis   Subjective:      Angela [name] is a 73 y.o. female patient admitted with  Abdominal pain.  Psychiatric was consulted for med management and capacity evaluation.  HPI:  73 year old female with past psychiatric history of borderline personality disorder, PTSD and past medical history of diverticulitis, endometriosis, and hernia, TIA, GERD, hiatal hernia, anemia, HTN, asthma, bursitis, hypothyroidism, housing and security presented to the hospital ED on 06/18 for abdominal pain.    Per RN katina- patient had been very difficult behavior wise.  Patient had been making racial comments.  Patient only wants white female nurse.  Even when white female nurse went to address patient she was very rude and disrespectful.  Patient has not been noticed to be responding to internal stimuli.  Patient reportedly had been 10 the doctors that not know how to treat diverticulitis.   Patient reportedly is reading about random particles and stating that we are not giving the correct treatment.  Had been threatening to [place]ley the hospital and providers.  Patient has multiple times fired nurses from her care and kept demanding for white nurse.  Even she kept saying white nurse as actually and other derogatory words.  Nurses report that patient is sleeping okay except yesterday night.  Patient reportedly disagreeing with the doctor about  diet  as she felt that doctor does not know anything and is causing her diverticulitis to be worse.    This writer went to see the patient in her room.  Patient  was sleeping but able to get up.  This Clinical research associate introduced himself patient reports that she does not need  psychiatrist.  She is not here for any psychiatric help.  Immediately denied any suicidal or homicidal thoughts.  And refused to talk to this Clinical research associate further.   Patient reports that she had allergic reaction from medication and psychiatrist can not do anything about it. This Clinical research associate told patient that he will trying to come and see her again later.  Patient reported that this writer should not come again to see her.   Patient also reported that she wants all her doctors to be changed and feels that this hospital is not taking care of her.  She wants to be transferred to another out [facility]. During the interaction patient was irritated but able to engage and does not appear to be responding to internal stimuli.    Patient was evaluated on 06/18/2023 in the emergency by psychiatric team.   Patient does not meet the criteria for IVC at that time. Per note - After evaluation by resident MD, patient refused to cooperate with CCM staff regarding disposition planning, instead demanding she be allowed to stay indefinitely within the ER until she such time she felt she had identified a suitable new place/friend to accommodate her housing desires. Patient became verbally agitated when PES staff attempted to explain/offer education on how this was not possible in an acute ER setting. Therapeutic Discharge procedures were initiated at this point.  Given patient's history of requiring police involvement to be removed from numerous private properties and local motel over the recent past, this behavior is felt to be consistent with her recent baseline of refusing to transition from current physical  space when it become necessary to do so. Overall, her presentation appears most consistent with Malingering at time of discharge from the ER.   Per collateral collected on 06/17/2023 : Leisa Shape (friend) calls back from [phone]-Requests pt not be notified she spoke with us  but has collateral she is willing to  share. Is aware of pt and reports she has had lots of problems with police and all and not all that friendly with the hotel she was supposed to be out at 11 am and they came and knocked she was still asleep, not feeling, well lots of diarrhea, had contact with someone with [place] that got sick. Yesterday a hospital in [place] prescribed her 7 medications, she picked them up and got them to pt recently. She had been living with Flo for 4.5 months, she doesn't want her back but will let her come get her belongings. 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 Has gotten nasty emails from her and thinks she was being bullied. She has in the past needed Paxil  for anxiety as far as her knowledge of pt's past psychiatric conditions. She will drop medications because of problems with her hands. She is in the hospital every few days. A number of years ago was in a facility but she fought in court and won her SSI back as her father left her some money but she went through it all. Been living independently but always needed people to come take her garbage out and things. Never been suicidal or tried to harm self she's a christian. She has never been violent or aggressive. Never shown s/s of hallucinating, she see's things the way she thinks they should be, such as the police are terrible and taking advantage of me. She has ulcers and gout and hernia she deals with. Finds money to go shopping but not take care of herself. She does not use drugs or alcohol .     Past Psychiatric History:    PTSD, borderline personality disorder. MDD, agitation   Unable to get patient's outpatient psychiatrist's name.   Unknown previous inpatient admissions. Per chart review seems like patient had trying to kill herself in the past.    Patient  has history of trauma.  Per chart review.   Have problems with chronic homelessness.  Risk to Self:   Risk to Others:   Prior Inpatient Therapy:   Prior Outpatient Therapy:    Past Medical History:  Past Medical History:  Diagnosis Date   Agitation 09/18/2017   Allergic rhinitis    Altered mental status, unspecified 12/25/2018   Anemia    Blurred vision    Chest pain of uncertain etiology 05/31/2018   Confusion state 12/25/2018   COPD (chronic obstructive pulmonary disease) (HCC)    Depression    Diverticulosis    Endometriosis    Falls    GERD (gastroesophageal reflux disease)    Hernia, hiatal    Hypothyroidism    IBS (irritable bowel syndrome)    Labile mood    Malignant neoplasm of skin    Malnutrition of mild degree (HCC) 08/08/2018   Migraine    Neuropathy    Noncompliance 12/25/2017   PTSD (post-traumatic stress disorder)    SIADH (syndrome of inappropriate ADH production) (HCC) 09/08/2016   Thyroid  disease  Past Surgical History:  Procedure Laterality Date   APPENDECTOMY     CHOLECYSTECTOMY     CHOLECYSTECTOMY, LAPAROSCOPIC     ESOPHAGOGASTRODUODENOSCOPY (EGD) WITH PROPOFOL  N/A 03/29/2015   Procedure: ESOPHAGOGASTRODUODENOSCOPY (EGD) WITH PROPOFOL ;  Surgeon: Donnice Vaughn Manes, MD;  Location[facility] ENDOSCOPY;  Service: Endoscopy;  Laterality: N/A;   HERNIA REPAIR     laproscopy     TONSILLECTOMY     uteral suspension     Family History:  Family History  Problem Relation Age of Onset   Heart disease Father    Hearing loss Father    COPD Father    Depression Father    Stroke Father    Vision loss Father    Varicose Veins Father    Cancer Mother        lung   Depression Sister    Arthritis Sister    Arthritis Brother    Hernia Brother    Anxiety disorder Brother    Arthritis Brother    Hernia Brother    Anxiety disorder Brother    Urolithiasis Neg Hx    Kidney disease Neg Hx    Kidney cancer Neg Hx    Prostate cancer Neg Hx    Breast cancer Neg  Hx    Family Psychiatric  History:   Unknown. Social History:  Social History   Substance and Sexual Activity  Alcohol  Use Not Currently   Comment: occ     Social History   Substance and Sexual Activity  Drug Use No    Social History   Socioeconomic History   Marital status: Single    Spouse name: Not on file   Number of children: 0   Years of education: 1.5 years of college   Highest education level: Some college, no degree  Occupational History   Occupation: Retired  Tobacco Use   Smoking status: Former    Current packs/day: 0.00    Types: Cigarettes    Quit date: 11/17/1976    Years since quitting: 46.6   Smokeless tobacco: Never  Vaping Use   Vaping status: Never Used  Substance and Sexual Activity   Alcohol  use: Not Currently    Comment: occ   Drug use: No   Sexual activity: Not Currently  Other Topics Concern   Not on file  Social History Narrative   Lives at home alone.   Right-handed.   No daily caffeine use.   Social Drivers of Corporate investment banker Strain: High Risk (02/13/2023)   Received from [facility]   Overall Financial Resource Strain (CARDIA)    Difficulty of Paying Living Expenses: Hard  Food Insecurity: Food Insecurity Present (07/04/2023)   Hunger Vital Sign    Worried About Running Out of Food in the Last Year: Sometimes true    Ran Out of Food in the Last Year: Sometimes true  Transportation Needs: Unmet Transportation Needs (07/04/2023)   PRAPARE - Administrator, Civil Service (Medical): Yes    Lack of Transportation (Non-Medical): Yes  Physical Activity: Inactive (04/03/2022)   Received from [facility]   Exercise Vital Sign    On average, how many days per week do you engage in moderate to strenuous exercise (like a brisk walk)?: 0 days    On average, how many minutes do you engage in exercise at this level?: 0 min  Stress: Stress Concern Present (04/03/2022)   Received from  [facility]   [place]  [facility] - Occupational Stress Questionnaire    Feeling of Stress : Very much  Social Connections: Socially Isolated (07/04/2023)   Social Connection and Isolation Panel    Frequency of Communication with Friends and Family: Never    Frequency of Social Gatherings with Friends and Family: Never    Attends Religious Services: More than 4 times per year    Active Member of [employer] or Organizations: No    Attends Banker Meetings: Never    Marital Status: Widowed   Additional Social History:    Allergies:   Allergies  Allergen Reactions   Amoxicillin -Pot Clavulanate Anaphylaxis and Hives   Aspirin Hives   Bee Venom Hives    Spider bites cause hives Spider bites cause hives   Cephalexin  Hives and Other (See Comments)    Can not take cephalexin  but stated tolerated cefpodoxime prescribed 02/2022    Ciprofloxacin  Anaphylaxis and Swelling    Per documentation in 2024 - patient has taken levofloxacin .  Patient confirmed can take levofloxacin  on 07/06/2023    Fluticasone Shortness Of Breath   Haloperidol  Anaphylaxis, Nausea And Vomiting and Other (See Comments)    Serotonin syndrome   Molds & Smuts Shortness Of Breath and Itching    Eyes-itchy     Neomycin-Polymyxin-Dexameth Swelling    Eye swelling   Other Anaphylaxis, Hives, Itching, Swelling and Rash    Spider bites cause hives Hives/throat swelling    Peanuts [Peanut Oil] Anaphylaxis and Hives   Penicillins Anaphylaxis and Hives   Propoxyphene Anaphylaxis and Hives    Hives/throat swelling     Sulfa Antibiotics Anaphylaxis, Hives and Itching   Sulfacetamide Sodium Anaphylaxis, Hives and Itching   Tramadol Hives and Other (See Comments)    Other reaction(s): Unknown   Amikacin Nausea And Vomiting and Other (See Comments)   Bee Pollen Other (See Comments)    Other reaction(s): Unknown   Imipramine Hives    Reaction to Tofranil    Oxycodone  Nausea And Vomiting    Hallucinations   Prednisone Nausea And Vomiting, Anxiety and Other (See Comments)    Crazy mood swings, strange thoughts. Tolerates depomedrol, dexamethasone . States can't do prednisone but other corticosteroids ok   Colchicine  Other (See Comments)    Reports that it interacts badly with my paxil . Unable to describe exactly what reaction is.   Hymenoptera Venom Preparations Hives    Reaction to spider bites   Lactose Intolerance (Gi)    Peanut-Containing Drug Products    Phthalylsulfacetamide    Pine Swelling    Pine tree pollen   Pregabalin Other (See Comments)   Risperidone Other (See Comments)    Serotonin syndrome     Sulfacetamide    Wound Dressing Adhesive    Adhesive [Tape] Hives and Rash   Losartan      Patient states she can not take losartan  as interacts with her underlying conditions.  Other ARBs are OK.     Vistaril  [Hydroxyzine  Pamoate] Itching and Anxiety    Can NOT take Vistaril  but CAN TAKE Atarax  (takes atarax  daily)    Labs:  Results for orders placed or performed during the hospital encounter of 07/04/23 (from the past 48 hours)  CBC     Status: Abnormal   Collection Time: 07/08/23  8:15 AM  Result Value Ref Range   WBC 4.6 4.0 - 10.5 K/uL   RBC 3.67 (L) 3.87 - 5.11 MIL/uL   Hemoglobin 10.9 (L) 12.0 - 15.0 g/dL   HCT 66.5 (  L) 36.0 - 46.0 %   MCV 91.0 80.0 - 100.0 fL   MCH 29.7 26.0 - 34.0 pg   MCHC 32.6 30.0 - 36.0 g/dL   RDW 86.5 88.4 - 84.4 %   Platelets 311 150 - 400 K/uL   nRBC 0.0 0.0 - 0.2 %    Comment: Performed at [facility], [address], [place], [place] [address]  Basic metabolic panel with GFR     Status: Abnormal   Collection Time: 07/08/23  8:15 AM  Result Value Ref Range   Sodium 139 135 - 145 mmol/L   Potassium 3.8 3.5 - 5.1 mmol/L   Chloride 105 98 - 111 mmol/L   CO2 26 22 - 32 mmol/L   Glucose, Bld 95 70 - 99 mg/dL    Comment: Glucose reference range applies only to samples taken after  fasting for at least 8 hours.   BUN <5 (L) 8 - 23 mg/dL   Creatinine, Ser 9.51 0.44 - 1.00 mg/dL   Calcium  8.6 (L) 8.9 - 10.3 mg/dL   GFR, Estimated >39 >39 mL/min    Comment: (NOTE) Calculated using the CKD-EPI Creatinine Equation (2021)    Anion gap 8 5 - 15    Comment: Performed at [facility], [address] [place], [place] [address]    Current Facility-Administered Medications  Medication Dose Route Frequency Provider Last Rate Last Admin   acetaminophen  (TYLENOL ) tablet 650 mg  650 mg Oral Q6H PRN Anderson, Chelsey L, MD   650 mg at 07/05/23 1723   albuterol  (PROVENTIL ) (2.5 MG/3ML) 0.083% nebulizer solution 2.5 mg  2.5 mg Nebulization Q6H PRN Lenon Marien CROME, MD   2.5 mg at 07/07/23 2143   alum & mag hydroxide-simeth (MAALOX/MYLANTA) 200-200-20 MG/5ML suspension 30 mL  30 mL Oral Q4H PRN Lenon Marien CROME, MD   30 mL at 07/07/23 1235   amLODipine  (NORVASC ) tablet 5 mg  5 mg Oral Daily Lenon Marien CROME, MD   5 mg at 07/08/23 1007   bisacodyl  (DULCOLAX) EC tablet 5 mg  5 mg Oral Daily PRN Lenon Marien CROME, MD       dicyclomine  (BENTYL ) capsule 10 mg  10 mg Oral TID PRN Jinny Carmine, MD   10 mg at 07/07/23 2000   enoxaparin  (LOVENOX ) injection 40 mg  40 mg Subcutaneous Q24H Lenon Marien CROME, MD   40 mg at 07/06/23 2217   feeding supplement (BOOST / RESOURCE BREEZE) liquid 1 Container  1 Container Oral TID BM Lenon Marien CROME, MD   1 Container at 07/06/23 1250   ferrous sulfate  tablet 325 mg  325 mg Oral Q breakfast Lenon Marien CROME, MD   325 mg at 07/08/23 1007   hydrOXYzine  (ATARAX ) tablet 25 mg  25 mg Oral Q8H PRN Duncan, Hazel V, MD   25 mg at 07/08/23 1221   levofloxacin  (LEVAQUIN ) IVPB 500 mg  500 mg Intravenous Q24H Niels Barrio F, [facility]       levothyroxine  (SYNTHROID ) tablet 112 mcg  112 mcg Oral QAC breakfast Lenon Marien L, MD   112 mcg at 07/08/23 1013   metroNIDAZOLE  (FLAGYL ) IVPB 500 mg  500 mg Intravenous Q12H Duncan, Hazel V, MD        morphine  (PF) 2 MG/ML injection 1 mg  1 mg Intravenous Q4H PRN Anderson, Chelsey L, MD   1 mg at 07/07/23 1818   ondansetron  (ZOFRAN ) tablet 4 mg  4 mg Oral Q6H PRN Lenon Marien CROME, MD  Or   ondansetron  (ZOFRAN ) injection 4 mg  4 mg Intravenous Q6H PRN Lenon Marien CROME, MD       pantoprazole  (PROTONIX ) EC tablet 20 mg  20 mg Oral Daily Lenon Marien L, MD   20 mg at 07/08/23 1007   PARoxetine  (PAXIL ) tablet 10 mg  10 mg Oral Daily Lenon Marien CROME, MD   10 mg at 07/08/23 1012   polyethylene glycol (MIRALAX  / GLYCOLAX ) packet 17 g  17 g Oral Daily Lenon Marien CROME, MD       sucralfate  (CARAFATE ) 1 GM/10ML suspension 1 g  1 g Oral QID Anderson, Chelsey L, MD   1 g at 07/07/23 9093   traZODone  (DESYREL ) tablet 25 mg  25 mg Oral QHS Lenon Marien CROME, MD   25 mg at 07/06/23 2217     Psychiatric Specialty Exam:  Mental status exam - Appearance- casual grooming byrd. Appears stated age.  In hospital clothes.  Alert and oriented x3-   Able to tell her name, was able to tell that she has elements. Behavior -   Uncooperative, guarded Motor activity-No psychomotor agitation or retardation noted. Speech - normal rate rhythm volume and tone. Prosody - within normal limits Mood-  " angry mad - can't you tell" Affect -  irritated,   congruent with the reported mood. Thought Perception-No auditory and visual hallucinations. Does not appear to be responding to internal stimuli . Thought content -within normal limits.  No suicidal or homicidal thoughts. Thought process -   Concerns of delusions.  Unable to evaluate in detail Association intact . Memory -intact . Fund of knowledge -intact . Attention -intact . Insight and judgment - limited Estimated Level of intellectual functioning-average. Estimated level of functioning-average.   Physical Exam: Physical Exam ROS Blood pressure (!) 132/93, pulse (!) 51, temperature 98.8 F (37.1 C), temperature source Oral, resp.  rate 16, height 5' 2 (1.575 m), weight 63 kg, SpO2 94%. Body mass index is 25.4 kg/m.  Treatment Plan Summary:    Given patient's clinical presentation with the staff few days and from the previous chart review seems like patient presentation is consistent with borderline personality disorder.  There are concerns of delusions though unable to evaluate those delusions in detail.  Patient denied any suicidal or homicidal ideations or any auditory visual hallucinations and does not appear to be at imminent risk to herself or others.  Patient does not meet the criteria for IVC at this time.   Diagnosis-  Borderline personality disorder.  Secondary gain can not be ruled out at this time.     Mood lability.   History of PTSD.     Plan- Will recommend Consider mood stabilizer like lithium, Depakote , Lamictal and some antipsychotic medication.  Patient refused to take any psychiatric medication   And at this time does not meet the criteria for IVC or nonemergency forced medication.    If patient is agreeable recommend to start patient on Seroquel  200 mg q.h.s. and Depakote  500 mg b.I.d..  Patient is refusing to take this medication at this time.     Will recommend patient to have DBT therapy on outpatient basis.   Continue Paxil  for now as patient is refusing any other medication at this time.    For acute agitation aggression recommend Zyprexa  10 mg Q 12 hourly as needed p.o./IM. avoid Haldol  and risperidone as it is reported  as allergies.     Psychiatry will sign off.  If patient agrees to talk  to the Psychiatric please re-consult us .   Thank you for the consult.  Disposition: No evidence of imminent risk to self or others at present.    Desmond Chimera, MD 07/08/2023 2:02 PM

## 2023-07-08 NOTE — Progress Notes (Signed)
 Orders made for IV Flagy and Levaquin  patient refused same .Dr Cleatus notified

## 2023-07-09 ENCOUNTER — Other Ambulatory Visit: Payer: Self-pay

## 2023-07-09 ENCOUNTER — Emergency Department (HOSPITAL_COMMUNITY)

## 2023-07-09 ENCOUNTER — Emergency Department (HOSPITAL_COMMUNITY): Admission: EM | Admit: 2023-07-09 | Discharge: 2023-07-10 | Disposition: A | Discharge status: Home / Self Care

## 2023-07-09 ENCOUNTER — Encounter (HOSPITAL_COMMUNITY): Payer: Self-pay | Admitting: Emergency Medicine

## 2023-07-09 DIAGNOSIS — K5792 Diverticulitis of intestine, part unspecified, without perforation or abscess without bleeding: Secondary | ICD-10-CM

## 2023-07-09 DIAGNOSIS — K5732 Diverticulitis of large intestine without perforation or abscess without bleeding: Secondary | ICD-10-CM | POA: Insufficient documentation

## 2023-07-09 DIAGNOSIS — E871 Hypo-osmolality and hyponatremia: Secondary | ICD-10-CM | POA: Insufficient documentation

## 2023-07-09 DIAGNOSIS — Z91010 Allergy to peanuts: Secondary | ICD-10-CM | POA: Insufficient documentation

## 2023-07-09 DIAGNOSIS — E876 Hypokalemia: Secondary | ICD-10-CM | POA: Insufficient documentation

## 2023-07-09 DIAGNOSIS — Z5900 Homelessness unspecified: Secondary | ICD-10-CM | POA: Insufficient documentation

## 2023-07-09 LAB — CULTURE, BLOOD (ROUTINE X 2)
Culture: NO GROWTH
Culture: NO GROWTH

## 2023-07-09 LAB — CBC WITH DIFFERENTIAL/PLATELET
Abs Immature Granulocytes: 0.02 10*3/uL (ref 0.00–0.07)
Basophils Absolute: 0.1 10*3/uL (ref 0.0–0.1)
Basophils Relative: 1 %
Eosinophils Absolute: 0.3 10*3/uL (ref 0.0–0.5)
Eosinophils Relative: 4 %
HCT: 36.3 % (ref 36.0–46.0)
Hemoglobin: 11.8 g/dL — ABNORMAL LOW (ref 12.0–15.0)
Immature Granulocytes: 0 %
Lymphocytes Relative: 19 %
Lymphs Abs: 1.3 10*3/uL (ref 0.7–4.0)
MCH: 30.2 pg (ref 26.0–34.0)
MCHC: 32.5 g/dL (ref 30.0–36.0)
MCV: 92.8 fL (ref 80.0–100.0)
Monocytes Absolute: 0.6 10*3/uL (ref 0.1–1.0)
Monocytes Relative: 9 %
Neutro Abs: 4.3 10*3/uL (ref 1.7–7.7)
Neutrophils Relative %: 67 %
Platelets: 378 10*3/uL (ref 150–400)
RBC: 3.91 MIL/uL (ref 3.87–5.11)
RDW: 13.4 % (ref 11.5–15.5)
WBC: 6.6 10*3/uL (ref 4.0–10.5)
nRBC: 0 % (ref 0.0–0.2)

## 2023-07-09 LAB — COMPREHENSIVE METABOLIC PANEL WITH GFR
ALT: 13 U/L (ref 0–44)
AST: 18 U/L (ref 15–41)
Albumin: 3.4 g/dL — ABNORMAL LOW (ref 3.5–5.0)
Alkaline Phosphatase: 96 U/L (ref 38–126)
Anion gap: 13 (ref 5–15)
BUN: 9 mg/dL (ref 8–23)
CO2: 23 mmol/L (ref 22–32)
Calcium: 8.7 mg/dL — ABNORMAL LOW (ref 8.9–10.3)
Chloride: 95 mmol/L — ABNORMAL LOW (ref 98–111)
Creatinine, Ser: 0.68 mg/dL (ref 0.44–1.00)
GFR, Estimated: >60 mL/min (ref >60)
Glucose, Bld: 86 mg/dL (ref 70–99)
Potassium: 3.1 mmol/L — ABNORMAL LOW (ref 3.5–5.1)
Sodium: 131 mmol/L — ABNORMAL LOW (ref 135–145)
Total Bilirubin: 0.4 mg/dL (ref 0.0–1.2)
Total Protein: 6.5 g/dL (ref 6.5–8.1)

## 2023-07-09 LAB — LIPASE, BLOOD: Lipase: 33 U/L (ref 11–51)

## 2023-07-09 MED ORDER — IOHEXOL 300 MG/ML  SOLN
100.0000 mL | Freq: Once | INTRAMUSCULAR | Status: AC | PRN
  Administered 2023-07-09: 100 mL via INTRAVENOUS

## 2023-07-09 MED ORDER — SACCHAROMYCES BOULARDII 250 MG PO CAPS
250.0000 mg | ORAL_CAPSULE | Freq: Two times a day (BID) | ORAL | Status: DC
  Administered 2023-07-09: 250 mg via ORAL
  Filled 2023-07-09: qty 1

## 2023-07-09 NOTE — TOC Progression Note (Signed)
 Transition of Care [facility]) - Progression Note    Patient Details  Name: Angela [name] MRN: [id number] Date of Birth: 03/02/1950  [facility]) CM/SW Contact  Marinda Cooks, RN Phone Number: 07/09/2023, 10:43 AM  Clinical Narrative:    This CM called  [facility] in [facility] @ [phone] Ext.[address] address [address] [place], [place] [address] spoke with one of the directors named Darice  and was informed of the services they provide through their day program that included but not limited to shower, laundry, mail service , food, & embedded CM etc.... . Also was informed they can receive her today as a walk in by or before 2pm if her appeal comes back and is denied . Its not a shelter just a day program  I   10:15 This CM attempted to discuss dc plan accompanied by my colleague & bedside RN pt became very agitated evidenced by her informing she was not well enough to dc today referencing wanting SNF , not havung finances and her bank account being closed, not feeling a shelter was suitable for a elderly disabled women etc..SABRAThis CM attempted to discuss the [name] option at which time pt continued to reference prior circumstances listed .   Pt remained agitated & stated  She wanted to woke with someone that wanted to work with her   Shelter list left at bedside  and the info for [employer] .   10:45: Rec'd denied appeal paperwork. CM Management alerted for further guidance  Regarding dc plan .   Expected Discharge Plan: Skilled Nursing Facility Barriers to Discharge: Barriers Resolved  Expected Discharge Plan and Services       Living arrangements for the past 2 months: Homeless Expected Discharge Date: 07/08/23                                     Social Determinants of Health (SDOH) Interventions SDOH Screenings   Food Insecurity: Food Insecurity Present (07/04/2023)  Housing: High Risk (07/04/2023)  Transportation Needs: Unmet Transportation Needs  (07/04/2023)  Utilities: At Risk (07/04/2023)  Alcohol  Screen: Low Risk  (06/19/2019)  Depression (PHQ2-9): Medium Risk (06/19/2019)  Financial Resource Strain: High Risk (02/13/2023)   Received from [facility]  Physical Activity: Inactive (04/03/2022)   Received from [facility]  Social Connections: Socially Isolated (07/04/2023)  Stress: Stress Concern Present (04/03/2022)   Received from [facility]  Tobacco Use: Medium Risk (07/03/2023)  Health Literacy: Low Risk  (07/11/2020)   Received from [facility]    Readmission Risk Interventions     No data to display

## 2023-07-09 NOTE — Discharge Summary (Incomplete)
 Physician Discharge Summary  Patient: Angela [name] FMW:[id number] DOB: [birth year]   Code Status: Full Code Admit date: 07/04/2023 Discharge date: 07/09/2023 Disposition: Home, No home health services recommended PCP: Pcp, No  Recommendations for Outpatient Follow-up:  Follow up with PCP within 1-2 weeks Regarding general hospital follow up and preventative care Review medications and modify/refill as needed Consider psychiatry referral   Discharge Diagnoses:  Principal Problem:   Diverticulitis  Brief Hospital Course Summary: Shaquitta Burbridge is a 73 y.o. female with a PMH significant for diverticulitis, endometriosis, inguinal hernias, TIA, GERD, hiatal hernia, anemia, HTN, asthma, PTSD, vitamin D  deficiency, arthritis, depression, hypothyroidism, housing insecurity. At baseline, they are homeless and are independent with their ADLs. Presents with uncomplicated sigmoid diverticulitis as seen on CT abd/pelvis. Allergies limit treatment options so admitted for IV treatment. GI consulted per patient request who agreed with plan and signed off. Social aspects determinant factor for dc. She is otherwise medically stable for dc. She has a benign abdominal exam, tolerating oral diet, having regular bowel movements, ambulatory, afebrile, has no leukocytosis, and can complete oral antibiotic course.    Upon arrival: vitals and labs overall unremarkable.  CT abdomen pelvis: Acute, uncomplicated sigmoid diverticulitis without perforation or abscess.  Confirms large hiatal hernia previously present. CT head negative for acute abnormalities.  Does show mild to moderate cerebral volume loss Chest x-ray: No acute intrathoracic process   They were initially treated with Zofran , meropenem , IV fluids, and IV morphine .    Currently appears stable by objective measures (physical exam, lab results, vital signs, imaging). She is refusing antibiotics intermittently. Did receive at least 4 days IV  Abx. Plan for completion of antibiotic course with levofloxacin  and flagyl  which is confirmed with pharmacy and patient that she has recently taken these and tolerated well. Blood cultures are negative x4 days. She has refused multiple nurses care for various reasons including their sex, race.  Psychiatry was consulted to evaluate for poorly managed symptoms of anxiety, depression and has history of diagnosis of borderline and bipolar but patient denies these. Patient refused to speak with psychiatrist but did deny SI. Summary of psychiatry recommendations: Will recommend Consider mood stabilizer like lithium, Depakote , Lamictal and some antipsychotic medication.  Patient refused to take any psychiatric medication   And at this time does not meet the criteria for IVC or nonemergency forced medication.   If patient is agreeable recommend to start patient on Seroquel  200 mg q.h.s. and Depakote  500 mg b.I.d..  Patient is refusing to take this medication at this time.     Will recommend patient to have DBT therapy on outpatient basis.   Continue Paxil  for now as patient is refusing any other medication at this time.   For acute agitation aggression recommend Zyprexa  10 mg Q 12 hourly as needed p.o./IM. avoid Haldol  and risperidone as it is reported  as allergies.  She has refused attending care as well today. She has expressed significant paranoia for care from all those she has come into contact with.  PT were consulted to evaluate patient for need of rehab which she requested but refused to submit to an evaluation. She was observed to be independent with ambulation, feeding, and dressing, hygiene so wouldn't qualify for acute rehab.  Several members from [facility] team have attempted to talk with patient and offer her resources and she has refused them.  She has been seen by chaplain for spiritual support.   She got very aggressive at the  nurses station so we called security. She has the security team in her  room now   Patient is medically stable and has been offered every resource available to hospital staff to give and refuses further care from this attending. She is discharged. Chronic and pertinent medications have been prescribed. Resource information is included in AVS.   Discharge Condition: Stable, improved Recommended discharge diet: Regular healthy diet  Consultations: GI Psychiatry   Procedures/Studies: None   Allergies as of 07/09/2023       Reactions   Amoxicillin -pot Clavulanate Anaphylaxis, Hives   Aspirin Hives   Bee Venom Hives   Spider bites cause hives Spider bites cause hives   Cephalexin  Hives, Other (See Comments)   Can not take cephalexin  but stated tolerated cefpodoxime prescribed 02/2022   Ciprofloxacin  Anaphylaxis, Swelling   Per documentation in 2024 - patient has taken levofloxacin .  Patient confirmed can take levofloxacin  on 07/06/2023   Fluticasone Shortness Of Breath   Haloperidol  Anaphylaxis, Nausea And Vomiting, Other (See Comments)   Serotonin syndrome   Molds & Smuts Shortness Of Breath, Itching   Eyes-itchy   Neomycin-polymyxin-dexameth Swelling   Eye swelling   Other Anaphylaxis, Hives, Itching, Swelling, Rash   Spider bites cause hives Hives/throat swelling   Peanuts [peanut Oil] Anaphylaxis, Hives   Penicillins Anaphylaxis, Hives   Propoxyphene Anaphylaxis, Hives   Hives/throat swelling   Sulfa Antibiotics Anaphylaxis, Hives, Itching   Sulfacetamide Sodium Anaphylaxis, Hives, Itching   Tramadol Hives, Other (See Comments)   Other reaction(s): Unknown   Amikacin Nausea And Vomiting, Other (See Comments)   Bee Pollen Other (See Comments)   Other reaction(s): Unknown   Imipramine Hives   Reaction to Tofranil   Oxycodone Nausea And Vomiting   Hallucinations   Prednisone Nausea And Vomiting, Anxiety, Other (See Comments)   Crazy mood swings, strange thoughts. Tolerates depomedrol, dexamethasone . States can't do prednisone but other  corticosteroids ok   Colchicine  Other (See Comments)   Reports that it interacts badly with my paxil . Unable to describe exactly what reaction is.   Hymenoptera Venom Preparations Hives   Reaction to spider bites   Lactose Intolerance (gi)    Peanut-containing Drug Products    Phthalylsulfacetamide    Pine Swelling   Pine tree pollen   Pregabalin Other (See Comments)   Risperidone Other (See Comments)   Serotonin syndrome   Sulfacetamide    Wound Dressing Adhesive    Adhesive [tape] Hives, Rash   Losartan     Patient states she can not take losartan  as interacts with her underlying conditions.  Other ARBs are OK.     Vistaril  [hydroxyzine  Pamoate] Itching, Anxiety   Can NOT take Vistaril  but CAN TAKE Atarax  (takes atarax  daily)     Med Rec must be completed prior to using this [facility]***        Follow-up Information     PCP. Schedule an appointment as soon as possible for a visit.   Why: Some PCP options in [place] area- not a comprehensive list   [facility]- [phone] [facility]- [phone] [facility]- [phone] [facility]- [phone] [facility]- [phone] Nichole Molly- [phone]   or [facility]: [facility] Rhonda [facility] Physician Referral Line [phone]               Subjective   Pt reports that she will not allow me to examine her nor converse with me. She says, end of discussion and then proceeds to pretend to call a senator to request a new doctor. She does not  respond to me any further. She appears comfortable with normal speech and moving spontaneously with normal respiration.    Objective  Blood pressure (!) 132/93, pulse (!) 51, temperature 98.8 F (37.1 C), temperature source Oral, resp. rate 16, height 5' 2 (1.575 m), weight 63 kg, SpO2 94%.   General: Pt is alert, awake, not in acute distress Cardiovascular: good color in all extremities, face indicating good perfusion Respiratory: normal respiratory effort and  conversing without difficulty Skin: no lesions on exposed skin Extremities: no edema, no cyanosis. Moving all spontaneously   The results of significant diagnostics from this hospitalization (including imaging, microbiology, ancillary and laboratory) are listed below for reference.   Imaging studies: CT ABDOMEN PELVIS W CONTRAST Result Date: 07/04/2023 CLINICAL DATA:  Abdominal pain, fever and chills, lower back pain EXAM: CT ABDOMEN AND PELVIS WITH CONTRAST TECHNIQUE: Multidetector CT imaging of the abdomen and pelvis was performed using the standard protocol following bolus administration of intravenous contrast. RADIATION DOSE REDUCTION: This exam was performed according to the departmental dose-optimization program which includes automated exposure control, adjustment of the mA and/or kV according to patient size and/or use of iterative reconstruction technique. CONTRAST:  OMNIPAQUE  IOHEXOL  300 MG/ML  SOLN COMPARISON:  10/15/2015 FINDINGS: Lower chest: Large hiatal hernia with the majority of the stomach in an intrathoracic location. No acute pleural or parenchymal lung disease. Hepatobiliary: No focal liver abnormality is seen. Status post cholecystectomy. No biliary dilatation. Pancreas: Unremarkable. No pancreatic ductal dilatation or surrounding inflammatory changes. Spleen: Normal in size without focal abnormality. Adrenals/Urinary Tract: Kidneys enhance normally and symmetrically. No urinary tract calculi or obstructive uropathy within either kidney. Marked distension of the urinary bladder. No filling defects. Stomach/Bowel: No bowel obstruction or ileus. There is diverticulosis of the descending and sigmoid colon, with long segment mural thickening and pericolonic fat stranding within the mid sigmoid colon consistent with acute diverticulitis. No evidence of perforation, fluid collection, or abscess. Vascular/Lymphatic: Aortic atherosclerosis. No enlarged abdominal or pelvic lymph nodes.  Reproductive: Stable 4.0 x 2.9 cm right adnexal cyst, previously evaluated by ultrasound and compatible with benign cyst. Uterus and left adnexa are unremarkable. Other: Trace pelvic free fluid. No free intraperitoneal gas. No abdominal wall hernia. Musculoskeletal: No acute or destructive bony abnormalities. Reconstructed images demonstrate no additional findings. IMPRESSION: 1. Acute uncomplicated sigmoid diverticulitis. No perforation, fluid collection, or abscess. 2. Marked distension of the urinary bladder. 3. Trace pelvic free fluid. 4. Large hiatal hernia, with the majority of the stomach in an intrathoracic location. 5.  Aortic Atherosclerosis (ICD10-I70.0). Electronically Signed   By: Ozell Daring M.D.   On: 07/04/2023 15:34   CT HEAD WO CONTRAST ( ) Result Date: 07/04/2023 CLINICAL DATA:  Head trauma, minor. EXAM: CT HEAD WITHOUT CONTRAST TECHNIQUE: Contiguous axial images were obtained from the base of the skull through the vertex without intravenous contrast. RADIATION DOSE REDUCTION: This exam was performed according to the departmental dose-optimization program which includes automated exposure control, adjustment of the mA and/or kV according to patient size and/or use of iterative reconstruction technique. COMPARISON:  CT of the head dated May 05, 2023. FINDINGS: Brain: Mild-to-moderate age-related cerebral volume loss. Chronic lacunar infarct within the anterior limb of the right internal capsule. No evidence of acute intracranial injury. No evidence of hemorrhage, mass or hydrocephalus. Vascular: Vascular calcifications within the carotid siphons. Skull: Intact and unremarkable.  No lesions are present. Sinuses/Orbits: Normal. Other: None. IMPRESSION: 1. Mild to moderate cerebral volume loss and chronic lacunar infarct within the anterior  limb of the right internal capsule. No apparent acute process. Electronically Signed   By: Evalene Coho M.D.   On: 07/04/2023 15:23   DG Chest  Port 1 View Result Date: 07/04/2023 CLINICAL DATA:  Back pain, febrile, sepsis EXAM: PORTABLE CHEST 1 VIEW COMPARISON:  06/30/2021 FINDINGS: Single frontal view of the chest demonstrates a stable cardiac silhouette. Large hiatal hernia again noted. No acute airspace disease, effusion, or pneumothorax. There are no acute bony abnormalities. IMPRESSION: 1. No acute intrathoracic process.  Stable hiatal hernia. Electronically Signed   By: Ozell Daring M.D.   On: 07/04/2023 14:25    Labs: Basic Metabolic Panel: Recent Labs  Lab 07/04/23 1150 07/05/23 0419 07/06/23 0448 07/08/23 0815  NA 134*  --  136 139  K 3.6  --  4.5 3.8  CL 102  --  105 105  CO2 23  --  26 26  GLUCOSE 107*  --  102* 95  BUN 11  --  7* <5*  CREATININE 0.72 0.69 0.56 0.48  CALCIUM  8.8*  --  8.3* 8.6*  MG 2.1  --   --   --    CBC: Recent Labs  Lab 07/04/23 1150 07/06/23 0448 07/08/23 0815  WBC 10.1 7.6 4.6  NEUTROABS 7.6  --   --   HGB 11.0* 11.1* 10.9*  HCT 33.3* 33.7* 33.4*  MCV 92.0 92.1 91.0  PLT 257 254 311   Microbiology: Results for orders placed or performed during the hospital encounter of 07/04/23  Blood Culture (routine x 2)     Status: None   Collection Time: 07/04/23 11:50 AM   Specimen: BLOOD  Result Value Ref Range Status   Specimen Description BLOOD BLOOD RIGHT FOREARM  Final   Special Requests   Final    BOTTLES DRAWN AEROBIC AND ANAEROBIC Blood Culture results may not be optimal due to an inadequate volume of blood received in culture bottles   Culture   Final    NO GROWTH 5 DAYS Performed at [facility], [address] [place], [place] [address]    Report Status 07/09/2023 FINAL  Final  Resp panel by RT-PCR (RSV, Flu A&B, Covid) Anterior Nasal Swab     Status: None   Collection Time: 07/04/23 11:50 AM   Specimen: Anterior Nasal Swab  Result Value Ref Range Status   SARS Coronavirus 2 by RT PCR NEGATIVE NEGATIVE Final    Comment: (NOTE) SARS-CoV-2 target nucleic  acids are NOT DETECTED.  The SARS-CoV-2 RNA is generally detectable in upper respiratory specimens during the acute phase of infection. The lowest concentration of SARS-CoV-2 viral copies this assay can detect is 138 copies/mL. A negative result does not preclude SARS-Cov-2 infection and should not be used as the sole basis for treatment or other patient management decisions. A negative result may occur with  improper specimen collection/handling, submission of specimen other than nasopharyngeal swab, presence of viral mutation(s) within the areas targeted by this assay, and inadequate number of viral copies(<138 copies/mL). A negative result must be combined with clinical observations, patient history, and epidemiological information. The expected result is Negative.  Fact Sheet for Patients:  BloggerCourse.com  Fact Sheet for Healthcare Providers:  SeriousBroker.it  This test is no t yet approved or cleared by the [place]  FDA and  has been authorized for detection and/or diagnosis of SARS-CoV-2 by FDA under an Emergency Use Authorization (EUA). This EUA will remain  in effect (meaning this test can be used) for the duration  of the COVID-19 declaration under Section 564(b)(1) of the Act, 21 U.S.C.section 360bbb-3(b)(1), unless the authorization is terminated  or revoked sooner.       Influenza A by PCR NEGATIVE NEGATIVE Final   Influenza B by PCR NEGATIVE NEGATIVE Final    Comment: (NOTE) The Xpert Xpress SARS-CoV-2/FLU/RSV plus assay is intended as an aid in the diagnosis of influenza from Nasopharyngeal swab specimens and should not be used as a sole basis for treatment. Nasal washings and aspirates are unacceptable for Xpert Xpress SARS-CoV-2/FLU/RSV testing.  Fact Sheet for Patients: BloggerCourse.com  Fact Sheet for Healthcare Providers: SeriousBroker.it  This  test is not yet approved or cleared by the [place]  FDA and has been authorized for detection and/or diagnosis of SARS-CoV-2 by FDA under an Emergency Use Authorization (EUA). This EUA will remain in effect (meaning this test can be used) for the duration of the COVID-19 declaration under Section 564(b)(1) of the Act, 21 U.S.C. section 360bbb-3(b)(1), unless the authorization is terminated or revoked.     Resp Syncytial Virus by PCR NEGATIVE NEGATIVE Final    Comment: (NOTE) Fact Sheet for Patients: BloggerCourse.com  Fact Sheet for Healthcare Providers: SeriousBroker.it  This test is not yet approved or cleared by the [place]  FDA and has been authorized for detection and/or diagnosis of SARS-CoV-2 by FDA under an Emergency Use Authorization (EUA). This EUA will remain in effect (meaning this test can be used) for the duration of the COVID-19 declaration under Section 564(b)(1) of the Act, 21 U.S.C. section 360bbb-3(b)(1), unless the authorization is terminated or revoked.  Performed at [facility], [address] [place], [place] [address]   Blood Culture (routine x 2)     Status: None   Collection Time: 07/04/23 12:19 PM   Specimen: BLOOD  Result Value Ref Range Status   Specimen Description BLOOD BLOOD RIGHT HAND  Final   Special Requests   Final    BOTTLES DRAWN AEROBIC AND ANAEROBIC Blood Culture results may not be optimal due to an inadequate volume of blood received in culture bottles   Culture   Final    NO GROWTH 5 DAYS Performed at [facility], [address] [place], [place] [address]    Report Status 07/09/2023 FINAL  Final   *Note: Due to a large number of results and/or encounters for the requested time period, some results have not been displayed. A complete set of results can be found in Results Review.    Time coordinating discharge: Over 30 minutes  Marien LITTIE Piety,  MD  [facility] 07/09/2023, 7:39 AM

## 2023-07-09 NOTE — Progress Notes (Signed)
 Patient expressed being displeased with being discharged. Patient was informed by care manager Marinda, RN that her appeal was denied. Discharge instructions given to and reviewed with patient. Discharge medications brought to unit by pharmacy and given to patient by this RN. Emmie, RN Chiropodist and security in room. Taxi was called at 1300 and patient going to [facility] in [place]. Patient refused to sign taxi Advice worker. Patient taken to discharge lounge by wheelchair with her belongings on a cart with security and [place], [place].

## 2023-07-09 NOTE — ED Provider Triage Note (Signed)
 Emergency Medicine Provider Triage Evaluation Note  Angela [name] , [name] 73 y.o. female  was evaluated in triage.  Pt complains of abdominal pain.  Recently discharged from [facility] yesterday.  States she is having worsening lower abdominal pain.  Dark stool.  Has not started her medications discharged with. Patient not very forthcoming with information.  Review of Systems  Positive: Abd pain Negative:   Physical Exam  BP (!) 154/84   Pulse 72   Temp 99.3 F (37.4 C)   Resp 17   SpO2 99%  Gen:   Awake, no distress   Resp:  Normal effort  MSK:   Moves extremities without difficulty  Other:    Medical Decision Making  Medically screening exam initiated at 4:53 PM.  Appropriate orders placed.  Angela [name] was informed that the remainder of the evaluation will be completed by another provider, this initial triage assessment does not replace that evaluation, and the importance of remaining in the ED until their evaluation is complete.  Abd pain   Angela [name] A, PA-C 07/09/23 1653

## 2023-07-09 NOTE — Progress Notes (Signed)
   07/09/23 1400  Spiritual Encounters  Type of Visit Follow up  Care provided to: Patient  Conversation partners present during encounter Other (comment) Counselling psychologist)  Referral source Chaplain assessment  Reason for visit Urgent spiritual support  OnCall Visit Yes  Spiritual Framework  Presenting Themes Goals in life/care;Impactful experiences and emotions;Other (comment) (Pt was belligerent b/c she was afraid of being left on the street in the heat by a cab driver. She only had the address of the placement; not the name of the facility where she was going.)  Interventions  Spiritual Care Interventions Made Established relationship of care and support;Compassionate presence;Reflective listening;Decision-making support/facilitation;Other (comment) (Helped the Pt call the facility to make sure they would be able to take her. They did not answer the phone but the voice mail said they would be open until 3PM)  Intervention Outcomes  Outcomes Connection to spiritual care;Autonomy/agency;Awareness of support;Reduced fear

## 2023-07-09 NOTE — TOC Transition Note (Signed)
 Transition of Care [facility]) - Discharge Note   Patient Details  Name: Angela [name] MRN: [id number] Date of Birth: [birth year]  Transition of Care [facility]) CM/SW Contact:  Marinda Cooks, RN Phone Number: 07/09/2023, 3:02 PM   Clinical Narrative:    This CM updated by covering MD pt medically cleared to dc today and has active DC order . This CM spoke attempted to speak with pt multiple times to confirm dc plan accompanied by bedside RN.   13:45 This CM arrived to bedside to provide pt insurance  appeal paper for denial, taxi voucher , taxi voucher waiver, & to confirm dc plan location. Pt was on the phone  with someone and asking this CM & bedside  RN to lv rm b/c she felt uncomfortable with us  in the rm while she on the phone , pt became very agitated with this CM once discussion of dc plan started  evidenced by her placing the phone near me for the person on the other line to hear me speak, stating she doesn't think she should be dc . This CM informed pt MD medically cleared her . Pt cont to express she did not feel she should dc and that she has an active infection & is not eating etc.. This CM was able to provide pt with info about the [facility] @ [phone] address [address] [place], [place] [address] that she agreed to dc to. Taxi voucher completed, pt refused to sign taxi waiver . This CM provided pt with dress from charity closet after she informed she did not have clothes . Medical team updated . No additional DC needs requested by medical team or identified by CM at this time .     Final next level of care: Other (comment) ([facility] in [place]) Barriers to Discharge: Transportation, Other (must enter comment) (Housing Insecurity)   Patient Goals and CMS Choice   CMS Medicare.gov Compare Post Acute Care list provided to:: Patient Choice offered to / list presented to : Patient [place] ownership interest in [facility].provided to:: Patient    Discharge  Placement                  Name of family member notified: Patient Patient and family notified of of transfer: 07/09/23  Discharge Plan and Services Additional resources added to the After Visit Summary for                                       Social Drivers of Health (SDOH) Interventions SDOH Screenings   Food Insecurity: Food Insecurity Present (07/04/2023)  Housing: High Risk (07/04/2023)  Transportation Needs: Unmet Transportation Needs (07/04/2023)  Utilities: At Risk (07/04/2023)  Alcohol  Screen: Low Risk  (06/19/2019)  Depression (PHQ2-9): Medium Risk (06/19/2019)  Financial Resource Strain: High Risk (02/13/2023)   Received from [facility]  Physical Activity: Inactive (04/03/2022)   Received from [facility]  Social Connections: Socially Isolated (07/04/2023)  Stress: Stress Concern Present (04/03/2022)   Received from [facility]  Tobacco Use: Medium Risk (07/03/2023)  Health Literacy: Low Risk  (07/11/2020)   Received from [facility]     Readmission Risk Interventions     No data to display

## 2023-07-09 NOTE — ED Triage Notes (Signed)
 Patient BIB EMS from [facility] c/o abdominal pain x 4 days. Patient report generalize abdominal pain. Patient report nausea and vomiting x 5 today. Patient report loose stool x3. Patent recently Discharge at [facility] yesterday. Patient refused to answer some questions at triage.  BP 139/70 HR 70 RR 16 O2sat 98% on RA

## 2023-07-09 NOTE — Progress Notes (Signed)
 Patient refusing to take her norvasc  stating her blood pressure needs to be rechecked first. RN attempted to check patient's blood pressure and patient asked nurse to come back and do so because she is on the phone.

## 2023-07-10 ENCOUNTER — Other Ambulatory Visit: Payer: Self-pay

## 2023-07-10 ENCOUNTER — Emergency Department (HOSPITAL_COMMUNITY)
Admission: EM | Admit: 2023-07-10 | Discharge: 2023-07-11 | Disposition: A | Discharge status: Home / Self Care | Source: Home / Self Care | Attending: Emergency Medicine | Admitting: Emergency Medicine

## 2023-07-10 DIAGNOSIS — Z5900 Homelessness unspecified: Secondary | ICD-10-CM | POA: Insufficient documentation

## 2023-07-10 MED ORDER — POTASSIUM CHLORIDE CRYS ER 20 MEQ PO TBCR
40.0000 meq | EXTENDED_RELEASE_TABLET | Freq: Once | ORAL | Status: AC
  Administered 2023-07-10: 40 meq via ORAL
  Filled 2023-07-10: qty 2

## 2023-07-10 NOTE — ED Provider Notes (Signed)
 [place] EMERGENCY DEPARTMENT AT [facility] Provider Note   CSN: [id number] Arrival date & time: 07/09/23  [id number]     Patient presents with: Abdominal Pain   Angela [name] is a 73 y.o. female.   73 year old presented emergency department for abdominal pain.  Discharged from [facility] yesterday for diverticulitis.  Reports abdominal pain returned with some nausea after she went to the homeless shelter they sent her to that did not have any room for her.  no fevers no chills.  Moving bowels normally.   Abdominal Pain      Prior to Admission medications   Medication Sig Start Date End Date Taking? Authorizing Provider  acetaminophen  (TYLENOL ) 650 MG CR tablet Take 650 mg by mouth every 8 (eight) hours as needed for pain.    [provider]  Acetylcysteine (NAC) 500 MG CAPS Take 500 mg by mouth 2 (two) times a day.    [provider]  albuterol  (VENTOLIN  HFA) 108 (90 Base) MCG/ACT inhaler albuterol  sulfate HFA 90 mcg/actuation aerosol inhaler  INHALE 2 PUFFS BY MOUTH EVERY 4 HOURS AS NEEDED 07/03/23   White, Adrienne R, NP  allopurinol  (ZYLOPRIM ) 100 MG tablet Take 1 tablet (100 mg total) by mouth daily. 04/25/23   Cyrena Mylar, MD  allopurinol  (ZYLOPRIM ) 100 MG tablet Take 1 tablet (100 mg total) by mouth daily. 07/03/23 10/01/23  Teresa Shelba SAUNDERS, NP  amLODipine  (NORVASC ) 5 MG tablet Take 1 tablet (5 mg total) by mouth daily. 05/07/23 05/06/24  Malvina Alm DASEN, MD  ascorbic acid  (VITAMIN C ) 1000 MG tablet Take 2,000 mg by mouth daily.    [provider]  benzonatate  (TESSALON  PERLES) 100 MG capsule Take 1 capsule (100 mg total) by mouth 3 (three) times daily as needed for cough. 06/13/23   Gordan Huxley, MD  calcium  carbonate (OSCAL) 1500 (600 Ca) MG TABS tablet Calcium  600 mg calcium  (1,500 mg) tablet  Take 1 tablet 3 times a day by oral route.    [provider]  colchicine  0.6 MG tablet Take 2 tablets ( 1.2 mg ) then in 1 hour  take 1 tablet ( 0.6 mg),  then take 1 tablet ( 0.6 mg ) daily for 6 days 01/15/23   Teresa Shelba SAUNDERS, NP  Cyanocobalamin  (VITAMIN B-12) 1000 MCG SUBL Place 1,000 mcg under the tongue.    [provider]  diclofenac  Sodium (VOLTAREN ) 1 % GEL Apply 4 g topically 4 (four) times daily. 01/31/23   McDonald, Juliene SAUNDERS, DPM  dicyclomine  (BENTYL ) 20 MG tablet Take 1 tablet (20 mg total) by mouth 3 (three) times daily before meals. 07/08/23 08/08/23  Lenon Marien CROME, MD  EPINEPHrine  0.3 mg/0.3 mL IJ SOAJ injection Inject into the skin. 03/01/22   [provider]  estradiol  (ESTRACE ) 0.1 MG/GM vaginal cream Place 1 Applicatorful vaginally daily.    [provider]  famotidine  (PEPCID ) 20 MG tablet Take 1 tablet (20 mg total) by mouth 2 (two) times daily as needed for heartburn or indigestion. 11/26/18   Marylynn Verneita CROME, MD  hydrOXYzine  (ATARAX ) 25 MG tablet Take 1 tablet (25 mg total) by mouth 2 (two) times daily as needed for anxiety. 07/08/23 08/07/23  Lenon Marien CROME, MD  levalbuterol  (XOPENEX ) 1.25 MG/3ML nebulizer solution Take 3 mLs by nebulization every 6 (six) hours as needed for wheezing.    [provider]  levofloxacin  (LEVAQUIN ) 750 MG tablet Take 1 tablet (750 mg total) by mouth daily for 3 days. 07/08/23 07/16/23  Lenon Marien CROME, MD  levothyroxine  (SYNTHROID ) 112 MCG tablet Take 1 tablet (112 mcg total) by mouth daily. 07/03/23 10/01/23  Teresa Shelba SAUNDERS, NP  Lidocaine  4 % PTCH     [provider]  LORazepam  (ATIVAN ) 1 MG tablet Take 1 mg by mouth every 4 (four) hours as needed for sleep. 02/06/18   [provider]  Magnesium  500 MG TABS Take 500 mg by mouth every other day.    [provider]  metroNIDAZOLE  (FLAGYL ) 500 MG tablet Take 1 tablet (500 mg total) by mouth 2 (two) times daily for 3 days. 07/08/23 07/12/23  Lenon Marien CROME, MD  mupirocin  ointment (BACTROBAN ) 2 % Apply 1 application  topically 3 (three) times  daily. Patient not taking: Reported on 07/04/2023 02/04/19   [provider]  Nutritional Supplements (ENSURE PLANT-BASED PROTEIN) LIQD Take 1 Units by mouth as needed. 06/14/23   Cyrena Mylar, MD  ondansetron  (ZOFRAN -ODT) 4 MG disintegrating tablet Take 1 tablet (4 mg total) by mouth every 8 (eight) hours as needed for nausea or vomiting. 04/25/23   Cyrena Mylar, MD  pantoprazole  (PROTONIX ) 40 MG tablet Take 1 tablet (40 mg total) by mouth daily. 06/14/23 06/13/24  Cyrena Mylar, MD  PARoxetine  (PAXIL ) 20 MG tablet Take 1 tablet (20 mg total) by mouth daily. 07/08/23 09/07/23  Lenon Marien CROME, MD  prednisoLONE acetate (PRED FORTE) 1 % ophthalmic suspension  07/26/18   [provider]  pyridoxine  (B-6) 100 MG tablet Take 1 tablet (100 mg total) by mouth 2 (two) times daily. Patient taking differently: Take 500 mg by mouth in the morning and at bedtime. 12/26/17   Clapacs, Norleen DASEN, MD  Senna-Natural Laxatives (SENOKOT LAXATIVE) Tea Bag MISC     [provider]  sucralfate  (CARAFATE ) 1 GM/10ML suspension Take 10 mLs (1 g total) by mouth 4 (four) times daily. 04/25/23 04/24/24  Cyrena Mylar, MD  terconazole  (TERAZOL 7 ) 0.4 % vaginal cream     [provider]  Tiotropium Bromide  Monohydrate 2.5 MCG/ACT AERS Inhale into the lungs. 04/22/22 02/19/24  [provider]  traZODone  (DESYREL ) 50 MG tablet Take 0.5 tablets (25 mg total) by mouth at bedtime. 04/25/23   Cyrena Mylar, MD  valsartan  (DIOVAN ) 320 MG tablet Take 1 tablet (320 mg total) by mouth daily. 07/08/23 09/07/23  Lenon Marien CROME, MD  vitamin C  (ASCORBIC ACID ) 250 MG tablet Take 1 tablet (250 mg total) by mouth daily. 05/07/23   Malvina Alm DASEN, MD  Vitamin D , Ergocalciferol , (DRISDOL ) 1.25 MG (50000 UNIT) CAPS capsule Take 1 capsule (50,000 Units total) by mouth every 7 (seven) days. Patient taking differently: Take 5,000 Units by mouth every 7 (seven) days. 05/19/19   Justus Leita DEL, MD  XIIDRA 5 % SOLN Place 1 drop  into both eyes 2 (two) times daily. 07/27/18   [provider]    Allergies: Amoxicillin -pot clavulanate, Aspirin, Bee venom, Cephalexin , Ciprofloxacin , Fluticasone, Haloperidol , Molds & smuts, Neomycin-polymyxin-dexameth, Other, Peanuts [peanut oil], Penicillins, Propoxyphene, Sulfa antibiotics, Sulfacetamide sodium, Tramadol, Amikacin, Bee pollen, Imipramine, Oxycodone, Prednisone, Colchicine , Hymenoptera venom preparations, Lactose intolerance (gi), Peanut-containing drug products, Phthalylsulfacetamide, Pine, Pregabalin, Risperidone, Sulfacetamide, Wound dressing adhesive, Adhesive [tape], Losartan , and Vistaril  [hydroxyzine  pamoate]    Review of Systems  Gastrointestinal:  Positive for abdominal pain.    Updated Vital Signs BP (!) 159/87 (BP Location: Right Arm)   Pulse 77   Temp 98.9 F (37.2 C) (Oral)   Resp 18   SpO2 100%   Physical Exam Vitals  and nursing note reviewed.  Constitutional:      Appearance: She is not toxic-appearing.  HENT:     Head: Normocephalic.   Cardiovascular:     Rate and Rhythm: Normal rate and regular rhythm.  Pulmonary:     Effort: Pulmonary effort is normal.     Breath sounds: Normal breath sounds.  Abdominal:     General: Abdomen is flat.     Palpations: Abdomen is soft.     Tenderness: There is no abdominal tenderness. There is no guarding or rebound.  Genitourinary:    Rectum: Normal.   Skin:    General: Skin is warm and dry.     Capillary Refill: Capillary refill takes less than 2 seconds.   Neurological:     Mental Status: She is alert.     (all labs ordered are listed, but only abnormal results are displayed) Labs Reviewed  CBC WITH DIFFERENTIAL/PLATELET - Abnormal; Notable for the following components:      Result Value   Hemoglobin 11.8 (*)    All other components within normal limits  COMPREHENSIVE METABOLIC PANEL WITH GFR - Abnormal; Notable for the following components:   Sodium 131 (*)    Potassium 3.1 (*)     Chloride 95 (*)    Calcium  8.7 (*)    Albumin 3.4 (*)    All other components within normal limits  LIPASE, BLOOD    EKG: None  Radiology: CT ABDOMEN PELVIS W CONTRAST Result Date: 07/09/2023 CLINICAL DATA:  Diverticulitis with complication suspected. First seen on CT 07/04/2023. Increasing nausea vomiting and loose stools. EXAM: CT ABDOMEN AND PELVIS WITH CONTRAST TECHNIQUE: Multidetector CT imaging of the abdomen and pelvis was performed using the standard protocol following bolus administration of intravenous contrast. RADIATION DOSE REDUCTION: This exam was performed according to the departmental dose-optimization program which includes automated exposure control, adjustment of the mA and/or kV according to patient size and/or use of iterative reconstruction technique. CONTRAST:  OMNIPAQUE  IOHEXOL  300 MG/ML  SOLN COMPARISON:  CT with contrast 07/04/2023 and 10/15/2015. FINDINGS: Lower chest: Large hiatal hernia with inverted intrathoracic stomach. Adjacent atelectasis in the medial lower lobes with lung bases otherwise clear. There is mild cardiomegaly with coronary artery calcifications. Hepatobiliary: The liver is steatotic and measures 20 cm length. There is no mass enhancement. Gallbladder is absent without biliary dilatation. Pancreas: No abnormality. Spleen: No abnormality. Adrenals/Urinary Tract: There is no adrenal mass. No renal mass enhancement. There are a few bilateral tiny Bosniak 2 cortical cysts which are too small characterize. No follow-up imaging recommended. There is bilateral mild hydroureteronephrosis which was seen on both prior studies, with symmetric excretion noted on delayed images. The bladder is unremarkable for the degree of distention, was previously markedly distended but has a normal volume today. Stomach/Bowel: Large hiatal hernia with intrathoracic stomach. Normal caliber small bowel without small bowel inflammation. An appendix is not seen. There is  diverticular disease of the descending and sigmoid colon. Wall thickening and inflammatory change again noted involving the mid to distal sigmoid, but without evidence of abscess or free air. Appearance is similar to 5 days ago. Vascular/Lymphatic: Aortic atherosclerosis. No enlarged abdominal or pelvic lymph nodes. Reproductive: Stable 4.1 x 2.8 cm right adnexal cyst. Uterus, left ovary are unremarkable. Other: Old surgical changes mid to lower abdominal wall. Small umbilical fat hernia with no incarcerated hernia. Minimal presacral pelvic ascites likely reactive. No free air. No interval change. Musculoskeletal: Advanced facet hypertrophy L4-5 and L5-S1. Grade 1 chronic  degenerative anterolisthesis L4-5 with acquired spinal stenosis. No acute or other significant osseous findings. IMPRESSION: 1. Grossly uncomplicated sigmoid diverticulitis, similar to 5 days ago. 2. Large hiatal hernia with inverted intrathoracic stomach. 3. Cardiomegaly with aortic and coronary artery atherosclerosis. 4. Hepatic steatosis. 5. Stable 4.1 x 2.8 cm right adnexal cyst. 6. Chronic mild bilateral hydroureteronephrosis with symmetric excretion on delayed images. No obstructing calculus or mass is seen. 7. Umbilical fat hernia. 8. Lower lumbar degenerative change with acquired spinal stenosis and grade 1 listhesis at L4-5. Aortic Atherosclerosis (ICD10-I70.0). Electronically Signed   By: Francis Quam M.D.   On: 07/09/2023 23:57     Procedures   Medications Ordered in the ED  potassium chloride  SA (KLOR-CON  M) CR tablet 40 mEq (has no administration in time range)  iohexol  (OMNIPAQUE ) 300 MG/ML solution 100 mL (100 mLs Intravenous Contrast Given 07/09/23 2313)    Clinical Course as of 07/10/23 0013  Tue Jul 10, 2023  0002 CT ABDOMEN PELVIS W CONTRAST IMPRESSION: 1. Grossly uncomplicated sigmoid diverticulitis, similar to 5 days ago. 2. Large hiatal hernia with inverted intrathoracic stomach. 3. Cardiomegaly with aortic  and coronary artery atherosclerosis. 4. Hepatic steatosis. 5. Stable 4.1 x 2.8 cm right adnexal cyst. 6. Chronic mild bilateral hydroureteronephrosis with symmetric excretion on delayed images. No obstructing calculus or mass is seen. 7. Umbilical fat hernia. 8. Lower lumbar degenerative change with acquired spinal stenosis and grade 1 listhesis at L4-5.  Aortic Atherosclerosis (ICD10-I70.0).   Electronically Signed   By: Francis Quam M.D.   On: 07/09/2023 23:57   [TY]    Clinical Course User Index [TY] Neysa Caron PARAS, DO                                 Medical Decision Making This is a 73 year old female presenting emergency department for abdominal pain.  Per chart review recent admission for diverticulitis.  She is afebrile nontachycardic hemodynamically stable.  She  she is eating food in the room currently and does not appear any distress.  Benign abdominal exam.  It seems she was denying going to homeless shelter this eveningI question secondary intention for her visit today secondary to her homelessness rather than complications of diverticulitis.  However  repeated CT scans which are unchanged.  No leukocytosis.  Mild hyponatremia hypokalemia.  She is drinking Gatorade in the room.  Potassium was repleted.  Will discharge in stable condition as she notes that she does have the antibiotics that were prescribed to her.  Amount and/or Complexity of Data Reviewed Radiology: ordered. Decision-making details documented in ED Course.  Risk Prescription drug management.      Final diagnoses:  Diverticulitis  Homeless    ED Discharge Orders     None          Neysa Caron PARAS, [place] 07/10/23 0013

## 2023-07-10 NOTE — Discharge Instructions (Signed)
 Continue to take antibiotics medications prescribed to you while you are in the hospital.  Return for fevers, chills, inability eat or drink or any new or worsening symptoms that are concerning to you.

## 2023-07-10 NOTE — ED Triage Notes (Signed)
 Patient reports bilateral eye irritation /redness onset this week due to hot weather . No vision loss.

## 2023-07-11 ENCOUNTER — Emergency Department (HOSPITAL_COMMUNITY)

## 2023-07-11 ENCOUNTER — Encounter (HOSPITAL_COMMUNITY): Payer: Self-pay

## 2023-07-11 ENCOUNTER — Emergency Department (HOSPITAL_COMMUNITY)
Admission: EM | Admit: 2023-07-11 | Discharge: 2023-07-12 | Disposition: A | Discharge status: Home / Self Care | Attending: Emergency Medicine | Admitting: Emergency Medicine

## 2023-07-11 DIAGNOSIS — T675XXA Heat exhaustion, unspecified, initial encounter: Secondary | ICD-10-CM | POA: Diagnosis not present

## 2023-07-11 DIAGNOSIS — Z91010 Allergy to peanuts: Secondary | ICD-10-CM | POA: Diagnosis not present

## 2023-07-11 DIAGNOSIS — R109 Unspecified abdominal pain: Secondary | ICD-10-CM | POA: Insufficient documentation

## 2023-07-11 DIAGNOSIS — R0600 Dyspnea, unspecified: Secondary | ICD-10-CM

## 2023-07-11 DIAGNOSIS — R42 Dizziness and giddiness: Secondary | ICD-10-CM | POA: Diagnosis not present

## 2023-07-11 DIAGNOSIS — Z5900 Homelessness unspecified: Secondary | ICD-10-CM | POA: Insufficient documentation

## 2023-07-11 DIAGNOSIS — E871 Hypo-osmolality and hyponatremia: Secondary | ICD-10-CM | POA: Diagnosis not present

## 2023-07-11 DIAGNOSIS — R0602 Shortness of breath: Secondary | ICD-10-CM | POA: Insufficient documentation

## 2023-07-11 DIAGNOSIS — R300 Dysuria: Secondary | ICD-10-CM | POA: Diagnosis not present

## 2023-07-11 DIAGNOSIS — T679XXA Effect of heat and light, unspecified, initial encounter: Secondary | ICD-10-CM | POA: Diagnosis present

## 2023-07-11 LAB — CBC WITH DIFFERENTIAL/PLATELET
Abs Immature Granulocytes: 0.02 10*3/uL (ref 0.00–0.07)
Basophils Absolute: 0.1 10*3/uL (ref 0.0–0.1)
Basophils Relative: 1 %
Eosinophils Absolute: 0.3 10*3/uL (ref 0.0–0.5)
Eosinophils Relative: 6 %
HCT: 37.7 % (ref 36.0–46.0)
Hemoglobin: 12.5 g/dL (ref 12.0–15.0)
Immature Granulocytes: 0 %
Lymphocytes Relative: 28 %
Lymphs Abs: 1.6 10*3/uL (ref 0.7–4.0)
MCH: 29.9 pg (ref 26.0–34.0)
MCHC: 33.2 g/dL (ref 30.0–36.0)
MCV: 90.2 fL (ref 80.0–100.0)
Monocytes Absolute: 0.5 10*3/uL (ref 0.1–1.0)
Monocytes Relative: 9 %
Neutro Abs: 3.2 10*3/uL (ref 1.7–7.7)
Neutrophils Relative %: 56 %
Platelets: 396 10*3/uL (ref 150–400)
RBC: 4.18 MIL/uL (ref 3.87–5.11)
RDW: 13.3 % (ref 11.5–15.5)
WBC: 5.8 10*3/uL (ref 4.0–10.5)
nRBC: 0 % (ref 0.0–0.2)

## 2023-07-11 LAB — COMPREHENSIVE METABOLIC PANEL WITH GFR
ALT: 17 U/L (ref 0–44)
AST: 28 U/L (ref 15–41)
Albumin: 3.4 g/dL — ABNORMAL LOW (ref 3.5–5.0)
Alkaline Phosphatase: 88 U/L (ref 38–126)
Anion gap: 9 (ref 5–15)
BUN: 9 mg/dL (ref 8–23)
CO2: 25 mmol/L (ref 22–32)
Calcium: 8.9 mg/dL (ref 8.9–10.3)
Chloride: 101 mmol/L (ref 98–111)
Creatinine, Ser: 0.63 mg/dL (ref 0.44–1.00)
GFR, Estimated: >60 mL/min (ref >60)
Glucose, Bld: 94 mg/dL (ref 70–99)
Potassium: 3.3 mmol/L — ABNORMAL LOW (ref 3.5–5.1)
Sodium: 135 mmol/L (ref 135–145)
Total Bilirubin: 0.4 mg/dL (ref 0.0–1.2)
Total Protein: 6.4 g/dL — ABNORMAL LOW (ref 6.5–8.1)

## 2023-07-11 LAB — CBC
HCT: 33.9 % — ABNORMAL LOW (ref 36.0–46.0)
Hemoglobin: 11.3 g/dL — ABNORMAL LOW (ref 12.0–15.0)
MCH: 30 pg (ref 26.0–34.0)
MCHC: 33.3 g/dL (ref 30.0–36.0)
MCV: 89.9 fL (ref 80.0–100.0)
Platelets: 406 10*3/uL — ABNORMAL HIGH (ref 150–400)
RBC: 3.77 MIL/uL — ABNORMAL LOW (ref 3.87–5.11)
RDW: 13.2 % (ref 11.5–15.5)
WBC: 5.2 10*3/uL (ref 4.0–10.5)
nRBC: 0 % (ref 0.0–0.2)

## 2023-07-11 LAB — TROPONIN I (HIGH SENSITIVITY)
Troponin I (High Sensitivity): 3 ng/L (ref <18)
Troponin I (High Sensitivity): 4 ng/L (ref <18)

## 2023-07-11 LAB — BASIC METABOLIC PANEL WITH GFR
Anion gap: 10 (ref 5–15)
BUN: 9 mg/dL (ref 8–23)
CO2: 23 mmol/L (ref 22–32)
Calcium: 9.2 mg/dL (ref 8.9–10.3)
Chloride: 100 mmol/L (ref 98–111)
Creatinine, Ser: 0.83 mg/dL (ref 0.44–1.00)
GFR, Estimated: >60 mL/min (ref >60)
Glucose, Bld: 98 mg/dL (ref 70–99)
Potassium: 3.7 mmol/L (ref 3.5–5.1)
Sodium: 133 mmol/L — ABNORMAL LOW (ref 135–145)

## 2023-07-11 MED ORDER — SODIUM CHLORIDE 0.9 % IV BOLUS: 1000.0000 mL | Freq: Once | INTRAVENOUS | Status: DC

## 2023-07-11 MED ORDER — LACTATED RINGERS IV BOLUS
500.0000 mL | Freq: Once | INTRAVENOUS | Status: AC
  Administered 2023-07-11: 500 mL via INTRAVENOUS

## 2023-07-11 NOTE — ED Notes (Signed)
 Patient transported to X-ray

## 2023-07-11 NOTE — ED Notes (Signed)
 This RN still awaiting IV team to gain IV access to patient. MD aware.

## 2023-07-11 NOTE — ED Notes (Signed)
 Patient given sandwich, applesauce and water, states no other needs at this time.
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
## 2023-07-11 NOTE — ED Provider Notes (Signed)
 [facility] Emergency Department Provider Note MRN:  [id number]  Arrival date & time: 07/11/23     Chief Complaint   Conjunctivitis   History of Present Illness   Angela [name] is a 73 y.o. year-old female presents to the ED with chief complaint of homelessness.  She states that she doesn't have anywhere to stay and got kicked out of a community shelter.  She states that she didn't want to stay outside in the heat, so she came to the ED.  She reports that she is still recovering from diverticulitis.  She states that she is trying to get to [facility].  She was concerned that she might be having an allergic reaction to the heat tonight so she came to the ER for evaluation.  History provided by patient.   Review of Systems  Pertinent positive and negative review of systems noted in HPI.    Physical Exam   Vitals:   07/10/23 2324  BP: (!) 164/100  Pulse: (!) 56  Resp: 16  Temp: 99.4 F (37.4 C)  SpO2: 96%    CONSTITUTIONAL:  non toxic-appearing, NAD NEURO:  Alert and oriented x 3, CN 3-12 grossly intact EYES:  eyes equal and reactive ENT/NECK:  Supple, no stridor  CARDIO:  normal rate, regular rhythm, appears well-perfused  PULM:  No respiratory distress,  GI/GU:  non-distended, no focal tenderness MSK/SPINE:  No gross deformities, no edema, moves all extremities  SKIN:  no rash, atraumatic   *Additional and/or pertinent findings included in MDM below  Diagnostic and Interventional Summary    EKG Interpretation Date/Time:    Ventricular Rate:    PR Interval:    QRS Duration:    QT Interval:    QTC Calculation:   R Axis:      Text Interpretation:         Labs Reviewed  BASIC METABOLIC PANEL WITH GFR - Abnormal; Notable for the following components:      Result Value   Sodium 133 (*)    All other components within normal limits  CBC WITH DIFFERENTIAL/PLATELET    No orders to display    Medications - No data to display    Procedures  /  Critical Care Procedures  ED Course and Medical Decision Making  I have reviewed the triage vital signs, the nursing notes, and pertinent available records from the EMR.  Social Determinants Affecting Complexity of Care: Patient is homelessness.   ED Course:    Medical Decision Making Patient here due to the hot weather.  States that she doesn't have anywhere to stay.  States   Patient doesn't appear toxic.  Labs ordered and look better than previous.  She is tolerating Gatorade here.  Feel that she can be discharged with outpatient resources.  Amount and/or Complexity of Data Reviewed Labs: ordered.         Consultants: No consultations were needed in caring for this patient.   Treatment and Plan: Emergency department workup does not suggest an emergent condition requiring admission or immediate intervention beyond  what has been performed at this time. The patient is safe for discharge and has  been instructed to return immediately for worsening symptoms, change in  symptoms or any other concerns    Final Clinical Impressions(s) / ED Diagnoses     ICD-10-CM   1. Homeless  Z59.00       ED Discharge Orders     None  Discharge Instructions Discussed with and Provided to Patient:   Discharge Instructions   None      Vicky Charleston, DEVONNA 07/11/23 9587    Griselda Norris, MD 07/11/23 [phone]

## 2023-07-11 NOTE — ED Notes (Signed)
 IV team at bedside

## 2023-07-11 NOTE — ED Notes (Signed)
Patient up to rest room to urinate. 
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
## 2023-07-11 NOTE — ED Notes (Signed)
 This RN and [employer] Rn attempted to start IV. Pt continues to refuse. Pt states  I just have PTSD and dont want to be bothered.

## 2023-07-11 NOTE — Discharge Instructions (Signed)
 The test today in the ED were reassuring.  No signs of heart failure or pneumonia.  Follow-up with your doctor as planned

## 2023-07-11 NOTE — ED Triage Notes (Signed)
 Patient BIB [facility] for [facility] and heat related weakness [facility]. Patient states she has been feeling generalized weakness due to heat and lack of food, states SHOB related to abdominal hernia. Patient on 2 LPM O2 via [place] for comfort, hx of COPD.

## 2023-07-12 ENCOUNTER — Encounter (HOSPITAL_COMMUNITY): Payer: Self-pay

## 2023-07-12 ENCOUNTER — Emergency Department (HOSPITAL_COMMUNITY)
Admission: EM | Admit: 2023-07-12 | Discharge: 2023-07-13 | Disposition: A | Discharge status: Home / Self Care | Source: Home / Self Care | Attending: Emergency Medicine | Admitting: Emergency Medicine

## 2023-07-12 ENCOUNTER — Other Ambulatory Visit: Payer: Self-pay

## 2023-07-12 DIAGNOSIS — R0602 Shortness of breath: Secondary | ICD-10-CM | POA: Diagnosis not present

## 2023-07-12 DIAGNOSIS — E871 Hypo-osmolality and hyponatremia: Secondary | ICD-10-CM | POA: Insufficient documentation

## 2023-07-12 DIAGNOSIS — Z91010 Allergy to peanuts: Secondary | ICD-10-CM | POA: Insufficient documentation

## 2023-07-12 DIAGNOSIS — Z5900 Homelessness unspecified: Secondary | ICD-10-CM | POA: Insufficient documentation

## 2023-07-12 DIAGNOSIS — R300 Dysuria: Secondary | ICD-10-CM | POA: Insufficient documentation

## 2023-07-12 LAB — COMPREHENSIVE METABOLIC PANEL WITH GFR
ALT: 20 U/L (ref 0–44)
AST: 32 U/L (ref 15–41)
Albumin: 3.6 g/dL (ref 3.5–5.0)
Alkaline Phosphatase: 92 U/L (ref 38–126)
Anion gap: 11 (ref 5–15)
BUN: 9 mg/dL (ref 8–23)
CO2: 24 mmol/L (ref 22–32)
Calcium: 8.9 mg/dL (ref 8.9–10.3)
Chloride: 95 mmol/L — ABNORMAL LOW (ref 98–111)
Creatinine, Ser: 0.69 mg/dL (ref 0.44–1.00)
GFR, Estimated: >60 mL/min (ref >60)
Glucose, Bld: 121 mg/dL — ABNORMAL HIGH (ref 70–99)
Potassium: 3.5 mmol/L (ref 3.5–5.1)
Sodium: 130 mmol/L — ABNORMAL LOW (ref 135–145)
Total Bilirubin: 0.2 mg/dL (ref 0.0–1.2)
Total Protein: 6.9 g/dL (ref 6.5–8.1)

## 2023-07-12 LAB — CBC WITH DIFFERENTIAL/PLATELET
Abs Immature Granulocytes: 0.05 10*3/uL (ref 0.00–0.07)
Basophils Absolute: 0.1 10*3/uL (ref 0.0–0.1)
Basophils Relative: 1 %
Eosinophils Absolute: 0.3 10*3/uL (ref 0.0–0.5)
Eosinophils Relative: 5 %
HCT: 35.7 % — ABNORMAL LOW (ref 36.0–46.0)
Hemoglobin: 11.7 g/dL — ABNORMAL LOW (ref 12.0–15.0)
Immature Granulocytes: 1 %
Lymphocytes Relative: 20 %
Lymphs Abs: 1.2 10*3/uL (ref 0.7–4.0)
MCH: 29.7 pg (ref 26.0–34.0)
MCHC: 32.8 g/dL (ref 30.0–36.0)
MCV: 90.6 fL (ref 80.0–100.0)
Monocytes Absolute: 0.6 10*3/uL (ref 0.1–1.0)
Monocytes Relative: 10 %
Neutro Abs: 3.9 10*3/uL (ref 1.7–7.7)
Neutrophils Relative %: 63 %
Platelets: 427 10*3/uL — ABNORMAL HIGH (ref 150–400)
RBC: 3.94 MIL/uL (ref 3.87–5.11)
RDW: 13.2 % (ref 11.5–15.5)
WBC: 6.1 10*3/uL (ref 4.0–10.5)
nRBC: 0 % (ref 0.0–0.2)

## 2023-07-12 LAB — CK: Total CK: 158 U/L (ref 38–234)
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
## 2023-07-12 NOTE — ED Provider Triage Note (Signed)
 Emergency Medicine Provider Triage Evaluation Note  Angela [name] , a 73 y.o. female  was evaluated in triage.  Pt complains of heat exposure. Homeless. Seen yesterday for the same.  Review of Systems  Positive: Heat exposure Negative: fever  Physical Exam  BP (!) 155/84 (BP Location: Right Arm)   Pulse 72   Temp 98.6 F (37 C) (Oral)   Resp 16   SpO2 100%  Gen:   Awake, no distress   Resp:  Normal effort  MSK:   Moves extremities without difficulty  Other:    Medical Decision Making  Medically screening exam initiated at 6:45 PM.  Appropriate orders placed.  Angela [name] was informed that the remainder of the evaluation will be completed by another provider, this initial triage assessment does not replace that evaluation, and the importance of remaining in the ED until their evaluation is complete.     Arloa Chroman, PA-C 07/12/23 [phone]

## 2023-07-13 ENCOUNTER — Emergency Department (HOSPITAL_COMMUNITY)
Admission: EM | Admit: 2023-07-13 | Discharge: 2023-07-14 | Disposition: A | Discharge status: Home / Self Care | Attending: Emergency Medicine | Admitting: Emergency Medicine

## 2023-07-13 ENCOUNTER — Ambulatory Visit: Admitting: Adult Health

## 2023-07-13 ENCOUNTER — Other Ambulatory Visit (HOSPITAL_COMMUNITY): Payer: Self-pay

## 2023-07-13 DIAGNOSIS — E039 Hypothyroidism, unspecified: Secondary | ICD-10-CM | POA: Insufficient documentation

## 2023-07-13 DIAGNOSIS — J449 Chronic obstructive pulmonary disease, unspecified: Secondary | ICD-10-CM | POA: Diagnosis not present

## 2023-07-13 DIAGNOSIS — T679XXA Effect of heat and light, unspecified, initial encounter: Secondary | ICD-10-CM

## 2023-07-13 DIAGNOSIS — X30XXXA Exposure to excessive natural heat, initial encounter: Secondary | ICD-10-CM | POA: Insufficient documentation

## 2023-07-13 DIAGNOSIS — R0602 Shortness of breath: Secondary | ICD-10-CM | POA: Diagnosis not present

## 2023-07-13 DIAGNOSIS — T675XXA Heat exhaustion, unspecified, initial encounter: Secondary | ICD-10-CM | POA: Diagnosis present

## 2023-07-13 DIAGNOSIS — R4789 Other speech disturbances: Secondary | ICD-10-CM | POA: Insufficient documentation

## 2023-07-13 DIAGNOSIS — R451 Restlessness and agitation: Secondary | ICD-10-CM | POA: Insufficient documentation

## 2023-07-13 DIAGNOSIS — Z5900 Homelessness unspecified: Secondary | ICD-10-CM | POA: Insufficient documentation

## 2023-07-13 LAB — COMPREHENSIVE METABOLIC PANEL WITH GFR
ALT: 20 U/L (ref 0–44)
AST: 25 U/L (ref 15–41)
Albumin: 4 g/dL (ref 3.5–5.0)
Alkaline Phosphatase: 117 U/L (ref 38–126)
Anion gap: 10 (ref 5–15)
BUN: 10 mg/dL (ref 8–23)
CO2: 25 mmol/L (ref 22–32)
Calcium: 9.5 mg/dL (ref 8.9–10.3)
Chloride: 100 mmol/L (ref 98–111)
Creatinine, Ser: 0.86 mg/dL (ref 0.44–1.00)
GFR, Estimated: >60 mL/min (ref >60)
Glucose, Bld: 129 mg/dL — ABNORMAL HIGH (ref 70–99)
Potassium: 3.1 mmol/L — ABNORMAL LOW (ref 3.5–5.1)
Sodium: 135 mmol/L (ref 135–145)
Total Bilirubin: 0.6 mg/dL (ref 0.0–1.2)
Total Protein: 7.9 g/dL (ref 6.5–8.1)

## 2023-07-13 LAB — URINALYSIS, ROUTINE W REFLEX MICROSCOPIC
Bilirubin Urine: NEGATIVE
Glucose, UA: NEGATIVE mg/dL
Hgb urine dipstick: NEGATIVE
Ketones, ur: NEGATIVE mg/dL
Leukocytes,Ua: NEGATIVE
Nitrite: NEGATIVE
Protein, ur: NEGATIVE mg/dL
Specific Gravity, Urine: 1.002 — ABNORMAL LOW (ref 1.005–1.030)
pH: 8 (ref 5.0–8.0)

## 2023-07-13 LAB — URINALYSIS, W/ REFLEX TO CULTURE (INFECTION SUSPECTED)
Bacteria, UA: NONE SEEN
Bilirubin Urine: NEGATIVE
Glucose, UA: NEGATIVE mg/dL
Hgb urine dipstick: NEGATIVE
Ketones, ur: NEGATIVE mg/dL
Leukocytes,Ua: NEGATIVE
Nitrite: NEGATIVE
Protein, ur: NEGATIVE mg/dL
Specific Gravity, Urine: 1.002 — ABNORMAL LOW (ref 1.005–1.030)
pH: 7 (ref 5.0–8.0)

## 2023-07-13 LAB — CBC WITH DIFFERENTIAL/PLATELET
Abs Immature Granulocytes: 0.03 10*3/uL (ref 0.00–0.07)
Basophils Absolute: 0.1 10*3/uL (ref 0.0–0.1)
Basophils Relative: 1 %
Eosinophils Absolute: 0.3 10*3/uL (ref 0.0–0.5)
Eosinophils Relative: 5 %
HCT: 39.4 % (ref 36.0–46.0)
Hemoglobin: 12.9 g/dL (ref 12.0–15.0)
Immature Granulocytes: 1 %
Lymphocytes Relative: 27 %
Lymphs Abs: 1.6 10*3/uL (ref 0.7–4.0)
MCH: 29.7 pg (ref 26.0–34.0)
MCHC: 32.7 g/dL (ref 30.0–36.0)
MCV: 90.8 fL (ref 80.0–100.0)
Monocytes Absolute: 0.4 10*3/uL (ref 0.1–1.0)
Monocytes Relative: 7 %
Neutro Abs: 3.6 10*3/uL (ref 1.7–7.7)
Neutrophils Relative %: 59 %
Platelets: 445 10*3/uL — ABNORMAL HIGH (ref 150–400)
RBC: 4.34 MIL/uL (ref 3.87–5.11)
RDW: 13.4 % (ref 11.5–15.5)
WBC: 6 10*3/uL (ref 4.0–10.5)
nRBC: 0 % (ref 0.0–0.2)

## 2023-07-13 LAB — SALICYLATE LEVEL: Salicylate Lvl: <7 mg/dL — ABNORMAL LOW (ref 7.0–30.0)

## 2023-07-13 LAB — ETHANOL: Alcohol, Ethyl (B): <15 mg/dL (ref <15)

## 2023-07-13 LAB — ACETAMINOPHEN LEVEL: Acetaminophen (Tylenol), Serum: <10 ug/mL — ABNORMAL LOW (ref 10–30)

## 2023-07-13 MED ORDER — POTASSIUM CHLORIDE CRYS ER 20 MEQ PO TBCR
40.0000 meq | EXTENDED_RELEASE_TABLET | Freq: Once | ORAL | Status: AC
  Administered 2023-07-13: 40 meq via ORAL
  Filled 2023-07-13: qty 2

## 2023-07-13 MED ORDER — FLUCONAZOLE 150 MG PO TABS
150.0000 mg | ORAL_TABLET | Freq: Once | ORAL | Status: AC
  Administered 2023-07-13: 150 mg via ORAL
  Filled 2023-07-13: qty 1

## 2023-07-13 MED ORDER — TRAZODONE HCL 50 MG PO TABS
25.0000 mg | ORAL_TABLET | Freq: Every day | ORAL | 0 refills | Status: AC
  Filled 2023-07-13: qty 2.5, 5d supply, fill #0

## 2023-07-13 MED ORDER — AMLODIPINE BESYLATE 5 MG PO TABS
5.0000 mg | ORAL_TABLET | Freq: Every day | ORAL | Status: DC
  Filled 2023-07-13: qty 1

## 2023-07-13 MED ORDER — ARTIFICIAL TEARS OPHTHALMIC OINT
1.0000 | TOPICAL_OINTMENT | Freq: Once | OPHTHALMIC | Status: DC
  Filled 2023-07-13: qty 3.5

## 2023-07-13 MED ORDER — FLUCONAZOLE 200 MG PO TABS
200.0000 mg | ORAL_TABLET | Freq: Once | ORAL | 0 refills | Status: AC
Start: 2023-07-13 — End: 2023-07-13
  Filled 2023-07-13: qty 1, 1d supply, fill #0

## 2023-07-13 MED ORDER — IRBESARTAN 300 MG PO TABS: 300.0000 mg | ORAL_TABLET | Freq: Every day | ORAL | Status: DC

## 2023-07-13 MED ORDER — SODIUM CHLORIDE 0.9 % IV BOLUS
500.0000 mL | Freq: Once | INTRAVENOUS | Status: AC
  Administered 2023-07-13: 500 mL via INTRAVENOUS

## 2023-07-13 NOTE — BH Assessment (Incomplete)
 Clinician messaged Fevrier, Lonni BIRCH, RN

## 2023-07-13 NOTE — ED Notes (Signed)
 Attempted assessment on patient. Patient describes that she is homeless and no one is helping her with shelter. Patient states she is out of her ativan  and her trazodone  and would like refills of her medications. Patient reports she has many health issues and knows exactly what she needs. Patient refuses assessment from this nurse. Patient reports everything you need to know is in my notes

## 2023-07-13 NOTE — ED Notes (Signed)
 Provided patient with a specimen cup and hat to collect urine specimen. Patient shown to the restroom. Patient returned a few minutes later with empty hat and specimen cup. Patient states I was just so upset I peed in the toilet and flushed it.

## 2023-07-13 NOTE — Discharge Instructions (Addendum)
 Your labs and physical exam today were reassuring. Please follow up in the outpatient clinic listed below for follow up.

## 2023-07-13 NOTE — BH Assessment (Addendum)
 Clinician messaged Fevrier, Lonni BIRCH, RN to ask if the pt is able to engage in the TTS assessment. RN reports, pt can engage.   Clinician expressed she can see the pt.    Jackson BIRCH Broach, MS, [facility], [facility] Triage Specialist [phone]

## 2023-07-13 NOTE — ED Provider Notes (Signed)
 [place] [facility]GENC[facility] DE[facility]MENT AT [facility] Provider Note   CSN: [id number] Arrival date & time: 07/12/23  1759     Patient presents with: Heat Exposure   Angela [name] is a 73 y.o. female with history of frequent ED visits for your malingering with history of homelessness.  Angela [name] presents today from [facility] reporting heat exposure after being outside all day, also states that Angela [name] is fearful staying at the [facility] and is requesting a place to stay.  Patient also requesting refills of Angela [name] trazodone  and Angela [name] Ativan .  Denies acute medical concern at this time.   HPI     Prior to Admission medications   Medication Sig Start Date End Date Taking? Authorizing Provider  fluconazole  (DIFLUCAN ) 200 MG tablet Take 1 tablet (200 mg total) by mouth once for 1 dose. 07/13/23 07/13/23 Yes Rafiq Bucklin, Pleasant SAUNDERS, PA-C  traZODone  (DESYREL ) 50 MG tablet Take 0.5 tablets (25 mg total) by mouth at bedtime for 5 days. 07/13/23 07/18/23 Yes Tiffney Haughton, Pleasant SAUNDERS, PA-C  acetaminophen  (TYLENOL ) 650 MG CR tablet Take 650 mg by mouth every 8 (eight) hours as needed for pain.    [provider]  Acetylcysteine (NAC) 500 MG CAPS Take 500 mg by mouth 2 (two) times a day.    [provider]  albuterol  (VENTOLIN  HFA) 108 (90 Base) MCG/ACT inhaler albuterol  sulfate HFA 90 mcg/actuation aerosol inhaler  INHALE 2 PUFFS BY MOUTH EVERY 4 HOURS AS NEEDED 07/03/23   White, Adrienne R, NP  allopurinol  (ZYLOPRIM ) 100 MG tablet Take 1 tablet (100 mg total) by mouth daily. 04/25/23   Cyrena Mylar, MD  allopurinol  (ZYLOPRIM ) 100 MG tablet Take 1 tablet (100 mg total) by mouth daily. 07/03/23 10/01/23  Teresa Shelba SAUNDERS, NP  amLODipine  (NORVASC ) 5 MG tablet Take 1 tablet (5 mg total) by mouth daily. 05/07/23 05/06/24  Malvina Alm DASEN, MD  ascorbic acid  (VITAMIN C ) 1000 MG tablet Take 2,000 mg by mouth daily.    [provider]  benzonatate  (TESSALON  PERLES) 100 MG capsule Take 1 capsule (100 mg total) by  mouth 3 (three) times daily as needed for cough. 06/13/23   Gordan Huxley, MD  calcium  carbonate (OSCAL) 1500 (600 Ca) MG TABS tablet Calcium  600 mg calcium  (1,500 mg) tablet  Take 1 tablet 3 times a day by oral route.    [provider]  Cyanocobalamin  (VITAMIN B-12) 1000 MCG SUBL Place 1,000 mcg under the tongue.    [provider]  diclofenac  Sodium (VOLTAREN ) 1 % GEL Apply 4 g topically 4 (four) times daily. 01/31/23   McDonald, Juliene SAUNDERS, DPM  dicyclomine  (BENTYL ) 20 MG tablet Take 1 tablet (20 mg total) by mouth 3 (three) times daily before meals. 07/08/23 08/08/23  Lenon Marien CROME, MD  EPINEPHrine  0.3 mg/0.3 mL IJ SOAJ injection Inject into the skin. 03/01/22   [provider]  estradiol  (ESTRACE ) 0.1 MG/GM vaginal cream Place 1 Applicatorful vaginally daily.    [provider]  famotidine  (PEPCID ) 20 MG tablet Take 1 tablet (20 mg total) by mouth 2 (two) times daily as needed for heartburn or indigestion. 11/26/18   Marylynn Verneita CROME, MD  hydrOXYzine  (ATARAX ) 25 MG tablet Take 1 tablet (25 mg total) by mouth 2 (two) times daily as needed for anxiety. 07/08/23 08/07/23  Lenon Marien CROME, MD  levalbuterol  (XOPENEX ) 1.25 MG/3ML nebulizer solution Take 3 mLs by nebulization every 6 (six) hours as needed for wheezing.    [provider]  levofloxacin  (LEVAQUIN )  750 MG tablet Take 1 tablet (750 mg total) by mouth daily for 3 days. 07/08/23 07/16/23  Lenon Marien CROME, MD  levothyroxine  (SYNTHROID ) 112 MCG tablet Take 1 tablet (112 mcg total) by mouth daily. 07/03/23 10/01/23  Teresa Shelba SAUNDERS, NP  Lidocaine  4 % PTCH     [provider]  LORazepam  (ATIVAN ) 1 MG tablet Take 1 mg by mouth every 4 (four) hours as needed for sleep. 02/06/18   [provider]  Magnesium  500 MG TABS Take 500 mg by mouth every other day.    [provider]  mupirocin  ointment (BACTROBAN ) 2 % Apply 1 application  topically 3 (three) times daily. Patient not  taking: Reported on 07/04/2023 02/04/19   [provider]  Nutritional Supplements (ENSURE PLANT-BASED PROTEIN) LIQD Take 1 Units by mouth as needed. 06/14/23   Cyrena Mylar, MD  ondansetron  (ZOFRAN -ODT) 4 MG disintegrating tablet Take 1 tablet (4 mg total) by mouth every 8 (eight) hours as needed for nausea or vomiting. 04/25/23   Cyrena Mylar, MD  pantoprazole  (PROTONIX ) 40 MG tablet Take 1 tablet (40 mg total) by mouth daily. 06/14/23 06/13/24  Cyrena Mylar, MD  PARoxetine  (PAXIL ) 20 MG tablet Take 1 tablet (20 mg total) by mouth daily. 07/08/23 09/07/23  Lenon Marien CROME, MD  prednisoLONE acetate (PRED FORTE) 1 % ophthalmic suspension  07/26/18   [provider]  pyridoxine  (B-6) 100 MG tablet Take 1 tablet (100 mg total) by mouth 2 (two) times daily. Patient taking differently: Take 500 mg by mouth in the morning and at bedtime. 12/26/17   Clapacs, Norleen DASEN, MD  Senna-Natural Laxatives (SENOKOT LAXATIVE) Tea Bag MISC     [provider]  sucralfate  (CARAFATE ) 1 GM/10ML suspension Take 10 mLs (1 g total) by mouth 4 (four) times daily. 04/25/23 04/24/24  Cyrena Mylar, MD  terconazole  (TERAZOL 7 ) 0.4 % vaginal cream     [provider]  Tiotropium Bromide  Monohydrate 2.5 MCG/ACT AERS Inhale into the lungs. 04/22/22 02/19/24  [provider]  valsartan  (DIOVAN ) 320 MG tablet Take 1 tablet (320 mg total) by mouth daily. 07/08/23 09/07/23  Lenon Marien CROME, MD  vitamin C  (ASCORBIC ACID ) 250 MG tablet Take 1 tablet (250 mg total) by mouth daily. 05/07/23   Malvina Alm DASEN, MD  Vitamin D , Ergocalciferol , (DRISDOL ) 1.25 MG (50000 UNIT) CAPS capsule Take 1 capsule (50,000 Units total) by mouth every 7 (seven) days. Patient taking differently: Take 5,000 Units by mouth every 7 (seven) days. 05/19/19   Justus Leita DEL, MD  XIIDRA 5 % SOLN Place 1 drop into both eyes 2 (two) times daily. 07/27/18   [provider]    Allergies: Amoxicillin -pot clavulanate, Aspirin, Bee  venom, Cephalexin , Ciprofloxacin , Fluticasone , Haloperidol , Molds & smuts, Neomycin-polymyxin-dexameth, Other, Peanuts [peanut oil], Penicillins, Propoxyphene, Sulfa antibiotics, Sulfacetamide sodium, Tramadol, Amikacin, Bee pollen, Imipramine, Oxycodone, Prednisone, Colchicine , Hymenoptera venom preparations, Lactose intolerance (gi), Peanut-containing drug products, Phthalylsulfacetamide, Pine, Pregabalin, Risperidone, Sulfacetamide, Wound dressing adhesive, Adhesive [tape], Losartan , and Vistaril  [hydroxyzine  pamoate]    Review of Systems  Genitourinary:  Positive for dysuria.    Updated Vital Signs BP (!) 143/91   Pulse 67   Temp 98.2 F (36.8 C)   Resp 16   SpO2 99%   Physical Exam Vitals and nursing note reviewed.  Constitutional:      Appearance: Angela [name] is not ill-appearing or toxic-appearing.  HENT:     Head: Normocephalic and atraumatic.     Mouth/Throat:     Mouth:  Mucous membranes are moist.     Pharynx: No oropharyngeal exudate or posterior oropharyngeal erythema.   Eyes:     General:        Right eye: No discharge.        Left eye: No discharge.     Conjunctiva/sclera: Conjunctivae normal.    Cardiovascular:     Rate and Rhythm: Normal rate and regular rhythm.     Pulses: Normal pulses.     Heart sounds: Normal heart sounds. No murmur heard. Pulmonary:     Effort: Pulmonary effort is normal. No respiratory distress.     Breath sounds: Normal breath sounds. No wheezing or rales.  Abdominal:     General: Bowel sounds are normal. There is no distension.     Palpations: Abdomen is soft.     Tenderness: There is no abdominal tenderness. There is no guarding or rebound.   Musculoskeletal:        General: No deformity.     Cervical back: Neck supple.     Right lower leg: No edema.     Left lower leg: No edema.   Skin:    General: Skin is warm and dry.     Capillary Refill: Capillary refill takes less than 2 seconds.   Neurological:     General: No focal  deficit present.     Mental Status: Angela [name] is alert and oriented to person, place, and time. Mental status is at baseline.   Psychiatric:        Mood and Affect: Mood normal.     (all labs ordered are listed, but only abnormal results are displayed) Labs Reviewed  COMPREHENSIVE METABOLIC PANEL WITH GFR - Abnormal; Notable for the following components:      Result Value   Sodium 130 (*)    Chloride 95 (*)    Glucose, Bld 121 (*)    All other components within normal limits  CBC WITH DIFFERENTIAL/PLATELET - Abnormal; Notable for the following components:   Hemoglobin 11.7 (*)    HCT 35.7 (*)    Platelets 427 (*)    All other components within normal limits  URINALYSIS, ROUTINE W REFLEX MICROSCOPIC - Abnormal; Notable for the following components:   Color, Urine COLORLESS (*)    Specific Gravity, Urine 1.002 (*)    All other components within normal limits  CK    EKG: None  Radiology: DG Chest 2 View Result Date: 07/11/2023 CLINICAL DATA:  Dyspnea EXAM: CHEST - 2 VIEW COMPARISON:  Chest x-ray 02/03/2023.  CT of the chest 07/11/2018. FINDINGS: Large hiatal hernia is again seen. There is gaseous distension of the intrathoracic stomach. The lungs are clear. There is no pleural effusion or pneumothorax. The cardiomediastinal silhouette is within normal limits. No acute fractures are seen. IMPRESSION: 1. Large hiatal hernia with gaseous distension of the intrathoracic stomach. 2. No acute cardiopulmonary process. Electronically Signed   By: Greig Pique M.D.   On: 07/11/2023 20:43     Procedures   Medications Ordered in the ED  artificial tears (LACRILUBE) ophthalmic ointment 1 Application (1 Application Both Eyes Patient Refused/Not Given 07/13/23 0556)  fluconazole  (DIFLUCAN ) tablet 150 mg (150 mg Oral Given 07/13/23 0517)                                    Medical Decision Making 73  y/o female who presents with concern for being unhoused.  HTN on intake vital signs open 1.   Cardiopulmonary signs unremarkable, dominant is benign per patient is well-appearing.  Angela [name] did report dysuria.  Amount and/or Complexity of Data Reviewed Labs:     Details: Labs reassuring from triage, mild hyponatremia at patient's baseline.  UA unremarkable for infection.  Risk Prescription drug management.   Clinical patient was consistent with malingering for secondary gain of place to sleep.  Will offer short course of refill for some of Angela [name] prescriptions. clinical concern for emergent underlying condition that would warrant further ED workup or inpatient management is exceedingly low.    Angela [name] voiced understanding of Angela [name] medical evaluation and treatment plan. Each of their questions answered to their expressed satisfaction.  Return precautions were given.  Patient is well-appearing, stable, and was discharged in good condition.  This chart was dictated using voice recognition software, Dragon. Despite the best efforts of this provider to proofread and correct errors, errors may still occur which can change documentation meaning.      Final diagnoses:  Homelessness    ED Discharge Orders          Ordered    traZODone  (DESYREL ) 50 MG tablet  Daily at bedtime        07/13/23 0503    fluconazole  (DIFLUCAN ) 200 MG tablet   Once        07/13/23 0510               Ajanay Farve R, PA-C 07/13/23 0645    Theadore Ozell HERO, MD 07/13/23 [phone]

## 2023-07-13 NOTE — ED Notes (Signed)
 Introduced self to patient. Patient laying in bed quietly. When asked why patient visited the emergency room, patient responded I am only going to say this once and I want to say it to the important people all at the same time. Patient also stated You have read all my notes so I shouldn't have to go in depth with anyone about what is wrong with me. Informed patient that I would return with PA so she doesn't have to repeat herself to multiple people. Patient now laying in bed sobbing.

## 2023-07-13 NOTE — ED Notes (Signed)
 Gave pt a mal[place] sandwich, and saltines

## 2023-07-13 NOTE — Discharge Instructions (Signed)
Outpatient Psychiatry and Counseling  [facility]: Mobile Crisis Management 24 hours:  [phone]  [facility] sliding scale fee and walk in schedule: M-F 8am-12pm/1pm-3pm [address]  [place], [place] [address] [phone]  [facility] [address] [name], [place] [address] [phone]  [facility] ([facility])- new patient walk-in appointments available Monday - Friday 8am -3pm.          [address] [place], [place] [address] [phone] or crisis line- [phone]  [facility] Outpatient Services/ Intensive Outpatient Therapy Program [address] [place], [place] [address] [phone]  [facility]                  Crisis Services      [phone]      [address]     [place], [place] [address]                 [facility]   [facility] [phone] [address] [place], [place] [address]   [facility]          [address],  [place], [place] [address]       [phone]  [facility] [address] [place], [place] [address] [phone]  [facility]    [address]    [place], [place] [address]     [phone]       Parish McKinney, MD     [address]     [place] [address]     [phone]       [facility] [address] [place] [address]  [facility]     [address]     [place], [place]      [phone]       [facility] Shamsher Ahluwalia, MD [address] [place], [place] [address] [phone]  [facility]     [address]     [place], [place] [address]     [phone]       [facility] [address] [place], [place] [address] [phone] Resources for Temporary  Residential Assistance/Crisis Centers  [facility] M-F 8am-3pm   [address] [place], [place] [address]   [phone] Services include: laundry, barbering, support groups, case management, phone  & computer access, showers, AA/NA mtgs, mental health/substance abuse nurse, job skills class, disability information, VA assistance, spiritual classes, etc.   HOMELESS SHELTERS  [facility]     [facility]   [address], [place]     [phone]              [facility] (women and children)       [address] [place], [place] [address] [phone] [email or web address] for application and process Application Required  [facility]   [address]    [place] [address]     [phone]                    [facility] [address] [place], [place] [address] [phone](schedule application appt.) Application Required  [facility] (women only)    [address]     [place],  [address]       [phone]      Intake starts 6pm daily Need valid ID, SSC, & Police report [facility] [place] [address] [place], [place] [phone] Application Required  [facility] (men only)     [address]      [name], [place]     [phone]       [facility] (Pregnant women only) [address] [place], [place] [phone]  [facility]      [address]      [name], [place] [address]     [phone]             [name] [facility] [address] [name] [phone] 90 day commitment/SA/Application process  [facility](men only)     [address]     [name], [place]     [phone]       Check-in at 7pm            [facility] [address] [place], [place] [address] [phone] Men/Women/Women and Children  must be there by 7 pm  [facility] [name], [place] [phone]                 

## 2023-07-13 NOTE — ED Triage Notes (Signed)
 [facility]. Primary complaint of heat exhaustion. Hx of hyponatremia.  BP 143/76 95 on RA CBG 125 T 97.8

## 2023-07-13 NOTE — ED Provider Notes (Signed)
 Emergency Department Provider Note   I have reviewed the triage vital signs and the nursing notes.   HISTORY  Chief Complaint No chief complaint on file.   HPI Angela [name] is a 73 y.o. female with past history reviewed below including COPD and recent admission earlier this month for uncomplicated diverticulitis presents with heat exhaustion.  She is homeless and states that since discharge she was put out on the street. EMS was called to [facility] were they found the patient complaining of heat exhaustion.  She reports history of abnormal electrolytes in the past and is concerned that these may be worse.  Describes how she feels let down by the healthcare system and the police. Denies any SI/HI.   In chart review patient was evaluated by psychiatry as an inpatient on 6/22.  They made several recommendations for medications but it is unclear if the patient has been taking these or followed up.  They did not feel she met inpatient criteria at that time.   Patient also evaluated by physical therapy on 6/21.  She did not meet criteria for additional physical therapy or inpatient rehab at that time.   Past Medical History:  Diagnosis Date   Agitation 09/18/2017   Allergic rhinitis    Altered mental status, unspecified 12/25/2018   Anemia    Blurred vision    Chest pain of uncertain etiology 05/31/2018   Confusion state 12/25/2018   COPD (chronic obstructive pulmonary disease) (HCC)    Depression    Diverticulosis    Endometriosis    Falls    GERD (gastroesophageal reflux disease)    Hernia, hiatal    Hypothyroidism    IBS (irritable bowel syndrome)    Labile mood    Malignant neoplasm of skin    Malnutrition of mild degree (HCC) 08/08/2018   Migraine    Neuropathy    Noncompliance 12/25/2017   PTSD (post-traumatic stress disorder)    SIADH (syndrome of inappropriate ADH production) (HCC) 09/08/2016   Thyroid  disease     Review of Systems  Constitutional: No  fever/chills. Positive fatigue.  Cardiovascular: Denies chest pain. Respiratory: Denies shortness of breath. Gastrointestinal: No abdominal pain.  No nausea, no vomiting.   Neurological: Negative for headaches.  ____________________________________________   PHYSICAL EXAM:  VITAL SIGNS: ED Triage Vitals  Encounter Vitals Group     BP 07/13/23 1658 129/74     Pulse Rate 07/13/23 1658 78     Resp 07/13/23 1658 16     Temp 07/13/23 1658 98.6 F (37 C)     Temp Source 07/13/23 1658 Oral     SpO2 07/13/23 1658 100 %   Constitutional: Alert and oriented. Mild agitation and tangential speech but able to re-direct.  Eyes: Conjunctivae are normal.  Head: Atraumatic. Nose: No congestion/rhinnorhea. Mouth/Throat: Mucous membranes are moist.  Neck: No stridor.   Cardiovascular: Normal rate, regular rhythm. Good peripheral circulation. Grossly normal heart sounds.   Respiratory: Normal respiratory effort.  No retractions. Lungs CTAB. Gastrointestinal: Soft and nontender. No distention.  Musculoskeletal: No lower extremity tenderness nor edema. No gross deformities of extremities. Neurologic:  Normal speech and language. No gross focal neurologic deficits are appreciated.  Skin:  Skin is warm, dry and intact. No rash noted. Psychiatric: Mood and affect are mildly agitated. Speech is tangential but able to redirect.  Not responding to internal stimulus.   ____________________________________________   LABS (all labs ordered are listed, but only abnormal results are displayed)  Labs Reviewed  ACETAMINOPHEN  LEVEL - Abnormal; Notable for the following components:      Result Value   Acetaminophen  (Tylenol ), Serum <10 (*)    All other components within normal limits  COMPREHENSIVE METABOLIC PANEL WITH GFR - Abnormal; Notable for the following components:   Potassium 3.1 (*)    Glucose, Bld 129 (*)    All other components within normal limits  SALICYLATE LEVEL - Abnormal; Notable for  the following components:   Salicylate Lvl <7.0 (*)    All other components within normal limits  CBC WITH DIFFERENTIAL/PLATELET - Abnormal; Notable for the following components:   Platelets 445 (*)    All other components within normal limits  URINALYSIS, W/ REFLEX TO CULTURE (INFECTION SUSPECTED) - Abnormal; Notable for the following components:   Color, Urine COLORLESS (*)    Specific Gravity, Urine 1.002 (*)    All other components within normal limits  ETHANOL   ____________________________________________  EKG   EKG Interpretation Date/Time:  Friday July 13 2023 17:47:20 EDT Ventricular Rate:  52 PR Interval:  175 QRS Duration:  113 QT Interval:  480 QTC Calculation: 447 R Axis:   54  Text Interpretation: Sinus rhythm Borderline intraventricular conduction delay Borderline T abnormalities, anterior leads Confirmed by Darra Chew [phone]) on 07/13/2023 5:58:56 PM       ____________________________________________   PROCEDURES  Procedure(s) performed:   Procedures  None  ____________________________________________   INITIAL IMPRESSION / ASSESSMENT AND PLAN / ED COURSE  Pertinent labs & imaging results that were available during my care of the patient were reviewed by me and considered in my medical decision making (see chart for details).   This patient is Presenting for Evaluation of dehydration, which does require a range of treatment options, and is a complaint that involves a high risk of morbidity and mortality.  The Differential Diagnoses include acute dehydration, acute kidney injury, electrolyte disturbance, etc.  Critical Interventions-    Medications  sodium chloride  0.9 % bolus 500 mL (0 mLs Intravenous Stopped 07/13/23 2224)  potassium chloride  SA (KLOR-CON  M) CR tablet 40 mEq (40 mEq Oral Given 07/13/23 2223)    Reassessment after intervention: feeling slightly better.   I decided to review pertinent External Data, and in summary patient seen  multiple times in the emergency department for similar.  Considered psychiatry evaluation during her prior admission but she refused.   Clinical Laboratory Tests Ordered, included CBC without leukocytosis. No AKI. EtOH negative. Minimally low K.   Cardiac Monitor Tracing which shows NSR.   Social Determinants of Health Risk Patient is homeless.   Consult complete with TOC. Patient evaluated by PT recently. Does not meet criteria for rehab or SNF placement.   TTS. Recs pending.   Medical Decision Making: Summary:  Patient presents emergency department for evaluation of heat exposure and concern for dehydration.  She is mildly agitated and tangential in her thought process.  She often goes off talking about either safety concerns at the shelter, police abuse, issues with her prior hospital care.  She denies any acute suicidal ideation.  I am able to redirect verbally.  I do see in the past teams have considered psychiatric evaluation which might be appropriate in this case.  Plan for screening blood work and reassess.  Reevaluation with update and discussion with patient. No indication for admit.   Patient's presentation is most consistent with acute presentation with potential threat to life or bodily function.   Disposition: pending  ____________________________________________  FINAL CLINICAL IMPRESSION(S) /  ED DIAGNOSES  Final diagnoses:  Heat exposure, initial encounter    Note:  This document was prepared using Dragon voice recognition software and may include unintentional dictation errors.  Fonda Law, MD, [facility] Emergency Medicine    Mariyanna Mucha, Fonda MATSU, MD 07/14/23 [phone]

## 2023-07-14 MED ORDER — IPRATROPIUM-ALBUTEROL 0.5-2.5 (3) MG/3ML IN SOLN
3.0000 mL | Freq: Once | RESPIRATORY_TRACT | Status: DC
  Filled 2023-07-14: qty 3

## 2023-07-14 NOTE — ED Provider Notes (Signed)
 Patient signed out pending TTS evaluation.  In brief, patient is homeless.  She is elderly and concerned about exposure to the heat.  Has been seen innumerable times in the emergency department with last 2 evaluations within 24 hours.  She does not have a stable living situation.  Workup is largely reassuring.  Recent evaluation by psychiatry, physical therapy, and case management.  Patient based on chart review has been difficult to assess and at times been uncooperative with assessment.  See clinical course below.  Patient refusing TTS evaluation.  She is awake and alert.  Discussed with her that while I appreciate her challenges with her homelessness, she has been given resources previously and that she does not have an admittable diagnosis in the emergency department is not a shelter.  We have provided her with food and fluids.  She has previously received resources from case management.  She got refills of her medications and prescriptions yesterday.  Unfortunately, this is a very difficult situation.  She was ultimately discharged as she refused most intervention and TTS evaluation.  Physical Exam  BP (!) 154/95 (BP Location: Right Arm)   Pulse (!) 59   Temp (!) 97.5 F (36.4 C) (Oral)   Resp 20   SpO2 100%   Physical Exam Awake, alert, no acute distress  Procedures  Procedures  ED Course / MDM   Clinical Course as of 07/14/23 0551  Sat Jul 14, 2023  0541 Informed by nursing that patient is requesting a breathing treatment.  She states that the room feels moist and she feels like she is having trouble with bleeding.  She is in no respiratory distress.  O2 sats 100% on room air.  She is not on oxygen .  Additionally, patient is declining TTS evaluation.  Discussed with her that this would be the way that she would have access to psychiatry for medication management.  Patient states that she does not want this.  She ruminates on the fact that she is old and that it is irresponsible  to discharge an elderly woman without safe housing.  I have reviewed her chart.  Case management attempted to engage with the patient multiple times in 6/23 and provided her with shelter resources.  She has been denied rehab previously.  She has also had a history of reported agitation and aggressiveness towards staff. [CH]  [phone] After requesting DuoNeb, patient refused.  Unable to reason with the patient.  She does not appear to have an acute emergent process.  Will discharge.  She has prior resources but will provide shelter resources again. [CH]    Clinical Course User Index [CH] Yaacov Koziol, Charmaine FALCON, MD   Medical Decision Making Amount and/or Complexity of Data Reviewed Labs: ordered.  Risk Prescription drug management.          Bari Charmaine FALCON, MD 07/14/23 [phone]

## 2023-07-18 NOTE — ED Provider Notes (Signed)
 ------------------------------------------------------------------------------- Attestation signed by Landis Dodge, MD at 07/19/23 1818 I personally saw the patient and performed a substantial portion of the visit, including all aspects of the medical decision. -------------------------------------------------------------------------------   [facility]  ED Provider Note Medical screening exam initiated in triage. HPI reviewed. Vital signs reviewed. Patient discharged to the homeless shelter this morning.  Unable to get to the top bunk at the homeless shelter and staff at the homeless shelter would not allow her to sleep in a chair so she was sent back to the emergency room.   Patient stable and in no distress in triage. [place], [place] 8:51 PM 07/18/2023    Angela [name] 73 y.o. female DOB: July 14, 1950 MRN: [id number] History   Chief Complaint  Patient presents with  . Homeless    EMS reports patient has been seen multiple times at different facilities, chief complaint of being tired. EMS reports patient was sent to a home but was unable to access the top bunk. The facility staff refused to let her sleep in a chair due to their policy. Patient states they are tired and want to sleep. A&Ox39, vss   73 year old female with past medical history consistent with COPD, depression, GERD, hypertension among others presents emergency room for evaluation of feeling tired and abdominal pain.  Patient reports she has a history of diverticulitis and thinks that is flaring.  She reports that she was recently seen here and discharged but was unable to go to the facility that had been set up for her.  She reports that she is unwilling to give up her check because when she gets well should build to work again.  She reports that she has been extremely tired and not getting good sleep.  Reports that she has right lower quadrant and periumbilical abdominal pain that has been  worsening.  She endorses nausea without vomiting.  Endorses constipation.  No known fever or chills.   History provided by:  Patient and medical records Language interpreter used: No        Past Medical History:  Diagnosis Date  . COPD (chronic obstructive pulmonary disease) (*)   . Depression   . Disease of thyroid  gland   . Encounter for Medicare annual wellness exam 01/18/2017  . GERD (gastroesophageal reflux disease)   . Hypertension     Past Surgical History:  Procedure Laterality Date  . Appendectomy    . Cholecystectomy    . Colonoscopy  10/17/2012  . Upper gastrointestinal endoscopy  03/29/2015    Social History   Substance and Sexual Activity  Alcohol  Use None   Tobacco Use History[1] E-Cigarettes  . Vaping Use    . Start Date    . Cartridges/Day    . Quit Date     Social History   Substance and Sexual Activity  Drug Use Not on file         Allergies[2]  Home Medications   ACETAMINOPHEN  (TYLENOL  ARTHRITIS,MAPAP) 650 MG CR TABLET    Take one tablet (650 mg dose) by mouth 2 (two) times a day as needed for Pain.   ALBUTEROL  SULFATE HFA (PROVENTIL ,VENTOLIN ,PROAIR ) 108 (90 BASE) MCG/ACT INHALER    Inhale two puffs into the lungs every 4 (four) hours as needed for Wheezing.   AMLODIPINE  BESYLATE (NORVASC ) 5 MG TABLET    Take one tablet (5 mg dose) by mouth daily.   CALCIUM  CARBONATE (OS-CAL) 600 MG TABS    Take one tablet (600 mg dose) by mouth  daily.   CHOLECALCIFEROL  (VITAMIN D -1000 MAX ST) 1000 UNITS TABLET    Take one tablet (1,000 Units dose) by mouth daily.   CONJUGATED ESTROGENS  (PREMARIN ) VAGINAL CREAM    Place one g vaginally at bedtime.   DICYCLOMINE  (BENTYL ) 20 MG TABLET    Take one tablet (20 mg dose) by mouth 15 (fifteen) minutes before meals for 10 days.   DOCUSATE SODIUM  (COLACE,DOK,DOCQLACE) CAPSULE    Take one capsule (100 mg dose) by mouth every 12 (twelve) hours for 10 days.   EPINEPHRINE  (EPIPEN  2-PAK) 0.3 MG/0.3 ML INJECTION    Inject  0.3 mLs (0.3 mg dose) into the muscle.   FERROUS SULFATE  (FERROUSUL) 325 (65 FE) MG TABLET    Take one tablet (325 mg dose) by mouth with breakfast.   FLUCONAZOLE  (DIFLUCAN ) 150 MG TABLET    Take 1 tablet as a single dose repeat after three days if persistent symptoms   FLUOROMETHOLONE (FML) 0.1% OPHTHALMIC SUSPENSION    Place one drop into both eyes 4 (four) times daily.   GUAIFENESIN  (MUCINEX ) 600 MG 12 HR TABLET    Take one tablet (600 mg dose) by mouth 2 (two) times daily.   HYDRALAZINE  HCL (APRESOLINE ) 25 MG TABLET    Take one tablet (25 mg dose) by mouth 3 (three) times a day.   HYDROXYZINE  HCL (ATARAX ) 25 MG TABLET    Take one tablet (25 mg dose) by mouth 2 (two) times a day as needed for Anxiety.   LACTULOSE  (CHRONULAC ) 10 G/15 ML SOLUTION    Take by mouth.   LEVALBUTEROL  (XOPENEX ) 1.25 MG/3ML NEBULIZER SOLUTION    Take 3 mLs (1.25 mg dose) by nebulization every 6 (six) hours as needed for Wheezing.   LEVOCETIRIZINE (XYZAL) 5 MG TABLET    Take one tablet (5 mg dose) by mouth 2 (two) times daily.   LEVOFLOXACIN  (LEVAQUIN ) 750 MG TABLET    Take one tablet (750 mg dose) by mouth daily for 10 days.   LEVOTHYROXINE  SODIUM (SYNTHROID ,LEVOTHROID,LEVOXYL ) 100 MCG TABLET    Take by mouth.   LEVOTHYROXINE  SODIUM (SYNTHROID ,LEVOTHROID,LEVOXYL ) 112 MCG TABLET    Take one tablet (112 mcg dose) by mouth daily.   LINACLOTIDE  (LINZESS ) 290 MCG CAPSULE    Take one capsule (290 mcg dose) by mouth 30 (thirty) minutes before breakfast.   LOSARTAN  POTASSIUM (COZAAR ) 100 MG TABLET    Take one tablet (100 mg dose) by mouth daily.   MAGNESIUM  OXIDE (MAG-OX) 400 MG TABLET    Take one tablet (400 mg dose) by mouth at bedtime.   METRONIDAZOLE  (FLAGYL ) 500 MG TABLET    Take one tablet (500 mg dose) by mouth 2 (two) times daily for 10 days.   MULTIPLE VITAMIN (THEREMS) TABS    Take 1 tablet by mouth daily.   PANTOPRAZOLE  SODIUM (PROTONIX ) 40 MG TABLET    Take one tablet (40 mg dose) by mouth daily.   PAROXETINE  HCL  (PAXIL ) 20 MG TABLET    Take one tablet (20 mg dose) by mouth daily.   POLYETHYLENE GLYCOL (MIRALAX ,GAVILAX,CLEARLAX) POWDER    Take seventeen g to thirty four g by mouth.   PRED MILD 0.12 % OPHTHALMIC SUSPENSION    Place one drop into both eyes 4 (four) times daily.   PYRIDOXINE  HCL (B-6) 500 MG TABS    Take 500 mg by mouth 2 (two) times daily.   SODIUM CHLORIDE  (OCEAN NASAL SPRAY) 0.65% NASAL SPRAY    1 spray by Both Nostrils route as needed for Congestion.  SUCRALFATE  (CARAFATE ) 1 G TABLET    Take one tablet (1 g dose) by mouth 4 (four) times daily.   TIOTROPIUM BROMIDE  (SPIRIVA  RESPIMAT) 2.5 MCG/ACTUATION INHALER    Inhale two puffs into the lungs daily.   TRAZODONE  (DESYREL ) 50 MG TABLET    Take one half tablet (25 mg dose) by mouth at bedtime.   TRIAMCINOLONE  ACETONIDE (KENALOG ) 0.1% CREAM    Apply topically 2 (two) times a day as needed.   UMECLIDINIUM-VILANTEROL (ANORO ELLIPTA ) 62.5-25 MCG/INH DISKUS INHALER    Inhale into the lungs.   VALSARTAN  (DIOVAN ) 320 MG TABLET    Take one tablet (320 mg dose) by mouth daily.   XIIDRA 5 % SOLN    Place one drop into both eyes 2 (two) times daily.    Primary Survey   Exposure    No visible abdominal trauma.       Review of Systems   Review of Systems  Constitutional:  Negative for chills and fever.  Respiratory:  Negative for cough and shortness of breath.   Cardiovascular:  Negative for chest pain and palpitations.  Gastrointestinal:  Positive for abdominal pain and nausea. Negative for vomiting.  Genitourinary:  Negative for dysuria and hematuria.  Musculoskeletal:  Negative for arthralgias and back pain.  All other systems reviewed and are negative.   Physical Exam   ED Triage Vitals [07/18/23 2053]  BP 124/65  Heart Rate 62  Resp 16  SpO2 98 %  Temp 98 F (36.7 C)    Physical Exam  Nursing note and vitals reviewed. Constitutional: She appears well-developed and well-nourished. She is in good hygiene. She has good  hygiene. She does not appear distressed, does not appear ill and no respiratory distress. Not diaphoretic.She is active.  Patient sitting comfortably on stretcher, alert and oriented. Patient speaking easily in full sentences, without an increased work of breathing. Patient appears chronically ill  HENT:  Head: Normocephalic and atraumatic.  Right Ear: Normal external ear.  Left Ear: Normal external ear.  Nose: Nose normal.  Mouth/Throat: Voice normal.  Eyes: EOM are intact. Conjunctivae are normal. Pupils are equal, round, and reactive to light.  Neck: Normal range of motion and voice normal. Neck supple. Normal range of motion. No nuchal rigidity.  Cardiovascular: Normal rate, regular rhythm and normal heart sounds.  Pulmonary/Chest: No respiratory distress. Not tachypneic. Respiratory effort normal and breath sounds normal.  Abdominal: Soft. There is moderate abdominal tenderness in the right lower quadrant and periumbilical area. There is no guarding. Abdomen not distended. No visible abdominal trauma.  Musculoskeletal: Normal range of motion.     Cervical back: Normal range of motion and neck supple. Normal range of motion.   Neurological: She is alert and oriented to person, place, and time. She has normal speech.  Skin: Skin is warm. Not diaphoretic. Skin is dry.  Psychiatric: She has a normal mood and affect. Her behavior is normal. Judgment and thought content normal.     ED Course   Lab results:   CBC AND DIFFERENTIAL - Abnormal      Result Value   WBC 5.7     RBC 3.65 (*)    HGB 10.8 (*)    HCT 32.8 (*)    MCV 89.9     MCH 29.6     MCHC 32.9     Plt Ct 370     RDW SD 46.0     MPV 8.9 (*)    NRBC% 0.0  Absolute NRBC Count 0.00     NEUTROPHIL % 53.7     LYMPHOCYTE % 30.1     MONOCYTE % 9.4     Eosinophil % 5.1     BASOPHIL % 1.2     IG% 0.5     ABSOLUTE NEUTROPHIL COUNT 3.08     ABSOLUTE LYMPHOCYTE COUNT 1.73     Absolute Monocyte Count 0.54     Absolute  Eosinophil Count 0.29     Absolute Basophil Count 0.07     Absolute Immature Granulocyte Count 0.03    COMPREHENSIVE METABOLIC PANEL - Abnormal   Na 135 (*)    Potassium 4.0     Cl 104     CO2 23     AGAP 8     Glucose 134 (*)    BUN 10     Creatinine 0.60     Ca 8.5 (*)    ALK PHOS 102     T Bili <0.2     Total Protein 6.1     Alb 3.7     GLOBULIN 2.4     ALBUMIN/GLOBULIN RATIO 1.5     BUN/CREAT RATIO 16.7     ALT 10     AST 24     eGFR 94     Comment: Normal GFR (glomerular filtration rate) > 60 mL/min/1.73 meters squared, < 60 may include impaired kidney function. Calculation based on the Chronic Kidney Disease Epidemiology Collaboration (CK-EPI)equation refit without adjustment for race.  CK - Abnormal   CK 175 (*)   LIPASE - Normal   Lipase 59    MAGNESIUM  - Normal   Mg 2.0    URINALYSIS W/MICRO REFLEX CULTURE - SYMPTOMATIC  URINE DRUGS OF ABUSE SCRN    Imaging: No data to display    ECG: ECG Results   None                                                                     Pre-Sedation Procedures  ED Course as of 07/19/23 0141  Estefana LABOR Fratto's Documentation  Thu Jul 19, 2023  0028 CK(!): 175  0028 Lipase: 59  0028 Magnesium : 2.0   Medical Decision Making 73 year old female with past medical history consistent with COPD, depression, GERD, hypertension among others presents emergency room for evaluation of feeling tired and abdominal pain.  The differential diagnosis associated with the patient's presentation includes: AKI, UTI, electrolyte abnormality, intra-abdominal infection, malingering, but not limited to  Patient vital stable at time of presentation.  Initiate labs. Endorses having difficulty having bowel movements that want to rule out obstruction.  Patient is moderately tender to the right lower quadrant.  Per chart review, patient was seen here yesterday and was placed in observation for case management  evaluation.  They opted to get to facility/shelter however she was unable to stay there for unknown reasons.  Patient's hemoglobin is down to 10.8 from 12.5 yesterday.  I was unable to obtain a Hemoccult because is unable to get a stool sample.  Patient does report black stools.  Will give IV Protonix  and admit to the hospitalist service.  I do not think a CT needs to be repeated at this time as she recently had a CT done  that showed colitis.  I considered admitting/observing and elected to admit to [facility]  Tests considered but not performed: Ct abdomen and pelvis, recently completed  Prescription medication was considered but ultimately not given after discussion with patient/family (e.g., pain medication, antiviral, antibiotic) : other, none  Chronic condition affecting patient care: Past Medical History: No date: COPD (chronic obstructive pulmonary disease) (*) No date: Depression No date: Disease of thyroid  gland 01/18/2017: Encounter for Medicare annual wellness exam No date: GERD (gastroesophageal reflux disease) No date: Hypertension  Patient's care significantly limited by social determinants of health including : Other social determinant of health. Low income, homelessness   Problems Addressed: Abdominal pain, unspecified abdominal location: acute illness or injury Gastrointestinal hemorrhage, unspecified gastrointestinal hemorrhage type: acute illness or injury  Amount and/or Complexity of Data Reviewed Independent Historian:     Details: Patient External Data Reviewed: notes.    Details: Reviewed previous recent ed encounters for similar complaints Labs: ordered. Decision-making details documented in ED Course. Radiology: ordered. Discussion of management or test interpretation with external provider(s): 1:10 AM Consult nhics, discuss with Dr. Marianne  Risk Prescription drug management. Decision regarding hospitalization. Diagnosis or treatment significantly limited by  social determinants of health.           Provider Communication  New Prescriptions   No medications on file    Modified Medications   No medications on file    Discontinued Medications   No medications on file    Clinical Impression Final diagnoses:  Gastrointestinal hemorrhage, unspecified gastrointestinal hemorrhage type  Abdominal pain, unspecified abdominal location    ED Disposition     ED Disposition  Admit   Condition  --   Comment  --                   Electronically signed by:      [1] Social History Tobacco Use  Smoking Status Never  Smokeless Tobacco Never  [2] Allergies Allergen Reactions  . Bee Venom Hives  . Dicyclomine  Itching  . Hydroxyzine  Itching  . Other Hives    Spider bites cause hives Spider bites  . Peanut Anaphylaxis and Hives    Hives/throat swelling  . Penicillins Hives    Has patient had a PCN reaction causing immediate rash, facial/tongue/throat swelling, SOB or lightheadedness with hypotension: Unknown Has patient had a PCN reaction causing severe rash involving mucus membranes or skin necrosis: Unknown Has patient had a PCN reaction that required hospitalization: Unknown Has patient had a PCN reaction occurring within the last 10 years: Unknown If all of the above answers are NO, then may proceed with Cephalosporin use.  [name] Propoxyphene And Methadone Hives    Hives/throat swelling Hives/throat swelling Hives/throat swelling  . Sulfa Antibiotics Anaphylaxis, Hives and Itching  . Sulfacetamide Sodium Anaphylaxis, Hives and Itching  . Tramadol Hives  . Amikacin Nausea And Vomiting  . Aspirin Hives  . Bee Pollen Unknown  . Cephalexin  Hives  . Contact Dermatitis Hives and Rash  . Haloperidol  Nausea And Vomiting  . Hymenoptera Venom Preparations Hives    Reaction to spider bites  . Imipramine Hives    Reaction to Tofranil  . Nuts Other  . Oxycodone-Acetaminophen  Other    hallucinations  .  Prednisone Anxiety and Nausea And Vomiting    Other reaction(s): Other (See Comments) Crazy mood swings, strange thoughts (pt does tolerate cortisone injections)  . Sulfur Hives  . Wasp Venom Protein Hives    Spider bites   Estefana LABOR  Steven, PA-C 07/19/23 9858

## 2023-08-19 DIAGNOSIS — Z79899 Other long term (current) drug therapy: Secondary | ICD-10-CM | POA: Diagnosis not present

## 2023-08-19 DIAGNOSIS — J449 Chronic obstructive pulmonary disease, unspecified: Secondary | ICD-10-CM | POA: Diagnosis not present

## 2023-08-19 DIAGNOSIS — Z5900 Homelessness unspecified: Secondary | ICD-10-CM | POA: Insufficient documentation

## 2023-08-19 DIAGNOSIS — Z87891 Personal history of nicotine dependence: Secondary | ICD-10-CM | POA: Diagnosis not present

## 2023-08-19 DIAGNOSIS — E039 Hypothyroidism, unspecified: Secondary | ICD-10-CM | POA: Diagnosis not present

## 2023-08-19 DIAGNOSIS — R110 Nausea: Secondary | ICD-10-CM | POA: Diagnosis not present

## 2023-08-19 DIAGNOSIS — M7989 Other specified soft tissue disorders: Secondary | ICD-10-CM | POA: Diagnosis not present

## 2023-08-19 DIAGNOSIS — I159 Secondary hypertension, unspecified: Secondary | ICD-10-CM | POA: Insufficient documentation

## 2023-08-19 DIAGNOSIS — Z79890 Hormone replacement therapy (postmenopausal): Secondary | ICD-10-CM | POA: Diagnosis not present

## 2023-08-19 DIAGNOSIS — Z91010 Allergy to peanuts: Secondary | ICD-10-CM | POA: Insufficient documentation

## 2023-08-19 DIAGNOSIS — R946 Abnormal results of thyroid function studies: Secondary | ICD-10-CM | POA: Diagnosis not present

## 2023-08-19 DIAGNOSIS — R072 Precordial pain: Secondary | ICD-10-CM | POA: Diagnosis present

## 2023-08-19 DIAGNOSIS — R0602 Shortness of breath: Secondary | ICD-10-CM | POA: Diagnosis not present

## 2023-08-20 ENCOUNTER — Other Ambulatory Visit (HOSPITAL_COMMUNITY): Payer: Self-pay

## 2023-08-20 ENCOUNTER — Emergency Department (HOSPITAL_COMMUNITY)
Admission: EM | Admit: 2023-08-20 | Discharge: 2023-08-20 | Disposition: A | Discharge status: Home / Self Care | Attending: Student | Admitting: Student

## 2023-08-20 ENCOUNTER — Encounter (HOSPITAL_COMMUNITY): Payer: Self-pay | Admitting: *Deleted

## 2023-08-20 ENCOUNTER — Other Ambulatory Visit: Payer: Self-pay

## 2023-08-20 ENCOUNTER — Emergency Department (HOSPITAL_COMMUNITY)

## 2023-08-20 ENCOUNTER — Encounter (HOSPITAL_COMMUNITY): Payer: Self-pay

## 2023-08-20 ENCOUNTER — Emergency Department (HOSPITAL_COMMUNITY): Admission: EM | Admit: 2023-08-20 | Discharge: 2023-08-20 | Disposition: A | Discharge status: Home / Self Care

## 2023-08-20 DIAGNOSIS — R110 Nausea: Secondary | ICD-10-CM | POA: Insufficient documentation

## 2023-08-20 DIAGNOSIS — R079 Chest pain, unspecified: Secondary | ICD-10-CM

## 2023-08-20 DIAGNOSIS — Z5900 Homelessness unspecified: Secondary | ICD-10-CM | POA: Insufficient documentation

## 2023-08-20 DIAGNOSIS — Z79890 Hormone replacement therapy (postmenopausal): Secondary | ICD-10-CM | POA: Insufficient documentation

## 2023-08-20 DIAGNOSIS — R0602 Shortness of breath: Secondary | ICD-10-CM | POA: Insufficient documentation

## 2023-08-20 DIAGNOSIS — I159 Secondary hypertension, unspecified: Secondary | ICD-10-CM | POA: Diagnosis not present

## 2023-08-20 DIAGNOSIS — R072 Precordial pain: Secondary | ICD-10-CM | POA: Insufficient documentation

## 2023-08-20 DIAGNOSIS — J449 Chronic obstructive pulmonary disease, unspecified: Secondary | ICD-10-CM | POA: Insufficient documentation

## 2023-08-20 DIAGNOSIS — E039 Hypothyroidism, unspecified: Secondary | ICD-10-CM | POA: Insufficient documentation

## 2023-08-20 DIAGNOSIS — Z91010 Allergy to peanuts: Secondary | ICD-10-CM | POA: Insufficient documentation

## 2023-08-20 DIAGNOSIS — R946 Abnormal results of thyroid function studies: Secondary | ICD-10-CM | POA: Insufficient documentation

## 2023-08-20 DIAGNOSIS — M7989 Other specified soft tissue disorders: Secondary | ICD-10-CM | POA: Insufficient documentation

## 2023-08-20 LAB — CBC
HCT: 31.6 % — ABNORMAL LOW (ref 36.0–46.0)
HCT: 36.1 % (ref 36.0–46.0)
Hemoglobin: 10.6 g/dL — ABNORMAL LOW (ref 12.0–15.0)
Hemoglobin: 11.7 g/dL — ABNORMAL LOW (ref 12.0–15.0)
MCH: 29.1 pg (ref 26.0–34.0)
MCH: 30.4 pg (ref 26.0–34.0)
MCHC: 32.4 g/dL (ref 30.0–36.0)
MCHC: 33.5 g/dL (ref 30.0–36.0)
MCV: 89.8 fL (ref 80.0–100.0)
MCV: 90.5 fL (ref 80.0–100.0)
Platelets: 298 K/uL (ref 150–400)
Platelets: 331 K/uL (ref 150–400)
RBC: 3.49 MIL/uL — ABNORMAL LOW (ref 3.87–5.11)
RBC: 4.02 MIL/uL (ref 3.87–5.11)
RDW: 14.1 % (ref 11.5–15.5)
RDW: 14.1 % (ref 11.5–15.5)
WBC: 5 K/uL (ref 4.0–10.5)
WBC: 5.3 K/uL (ref 4.0–10.5)
nRBC: 0 % (ref 0.0–0.2)
nRBC: 0 % (ref 0.0–0.2)

## 2023-08-20 LAB — COMPREHENSIVE METABOLIC PANEL WITH GFR
ALT: 15 U/L (ref 0–44)
AST: 23 U/L (ref 15–41)
Albumin: 3.4 g/dL — ABNORMAL LOW (ref 3.5–5.0)
Alkaline Phosphatase: 66 U/L (ref 38–126)
Anion gap: 9 (ref 5–15)
BUN: 16 mg/dL (ref 8–23)
CO2: 24 mmol/L (ref 22–32)
Calcium: 9.1 mg/dL (ref 8.9–10.3)
Chloride: 102 mmol/L (ref 98–111)
Creatinine, Ser: 0.69 mg/dL (ref 0.44–1.00)
GFR, Estimated: >60 mL/min (ref >60)
Glucose, Bld: 112 mg/dL — ABNORMAL HIGH (ref 70–99)
Potassium: 3.3 mmol/L — ABNORMAL LOW (ref 3.5–5.1)
Sodium: 135 mmol/L (ref 135–145)
Total Bilirubin: 0.7 mg/dL (ref 0.0–1.2)
Total Protein: 6.1 g/dL — ABNORMAL LOW (ref 6.5–8.1)

## 2023-08-20 LAB — BASIC METABOLIC PANEL WITH GFR
Anion gap: 8 (ref 5–15)
BUN: 15 mg/dL (ref 8–23)
CO2: 24 mmol/L (ref 22–32)
Calcium: 9.1 mg/dL (ref 8.9–10.3)
Chloride: 105 mmol/L (ref 98–111)
Creatinine, Ser: 0.69 mg/dL (ref 0.44–1.00)
GFR, Estimated: >60 mL/min (ref >60)
Glucose, Bld: 89 mg/dL (ref 70–99)
Potassium: 3.9 mmol/L (ref 3.5–5.1)
Sodium: 137 mmol/L (ref 135–145)

## 2023-08-20 LAB — LIPASE, BLOOD: Lipase: 40 U/L (ref 11–51)

## 2023-08-20 LAB — T4, FREE: Free T4: 0.55 ng/dL — ABNORMAL LOW (ref 0.61–1.12)

## 2023-08-20 LAB — BRAIN NATRIURETIC PEPTIDE: B Natriuretic Peptide: 65.8 pg/mL (ref 0.0–100.0)

## 2023-08-20 LAB — TROPONIN I (HIGH SENSITIVITY)
Troponin I (High Sensitivity): 4 ng/L (ref <18)
Troponin I (High Sensitivity): 4 ng/L (ref <18)

## 2023-08-20 LAB — MAGNESIUM: Magnesium: 2.1 mg/dL (ref 1.7–2.4)

## 2023-08-20 LAB — TSH: TSH: 35.07 u[IU]/mL — ABNORMAL HIGH (ref 0.350–4.500)

## 2023-08-20 MED ORDER — MAGNESIUM OXIDE -MG SUPPLEMENT 400 (240 MG) MG PO TABS
800.0000 mg | ORAL_TABLET | Freq: Once | ORAL | Status: AC
  Administered 2023-08-20: 800 mg via ORAL
  Filled 2023-08-20: qty 2

## 2023-08-20 MED ORDER — LEVOTHYROXINE SODIUM 112 MCG PO TABS
112.0000 ug | ORAL_TABLET | Freq: Every day | ORAL | 0 refills | Status: AC
  Filled 2023-08-20: qty 30, 30d supply, fill #0

## 2023-08-20 MED ORDER — ARTIFICIAL TEARS OPHTHALMIC OINT
1.0000 | TOPICAL_OINTMENT | Freq: Once | OPHTHALMIC | Status: DC
  Filled 2023-08-20: qty 3.5

## 2023-08-20 MED ORDER — IRBESARTAN 300 MG PO TABS: 300.0000 mg | ORAL_TABLET | Freq: Every day | ORAL | Status: DC

## 2023-08-20 MED ORDER — AMLODIPINE BESYLATE 5 MG PO TABS
5.0000 mg | ORAL_TABLET | Freq: Every day | ORAL | Status: DC
  Administered 2023-08-20: 5 mg via ORAL
  Filled 2023-08-20: qty 1

## 2023-08-20 MED ORDER — POTASSIUM CHLORIDE CRYS ER 20 MEQ PO TBCR
40.0000 meq | EXTENDED_RELEASE_TABLET | Freq: Once | ORAL | Status: AC
  Administered 2023-08-20: 40 meq via ORAL
  Filled 2023-08-20: qty 2

## 2023-08-20 MED ORDER — DICYCLOMINE HCL 10 MG PO CAPS
10.0000 mg | ORAL_CAPSULE | Freq: Once | ORAL | Status: AC
  Administered 2023-08-20: 10 mg via ORAL
  Filled 2023-08-20: qty 1

## 2023-08-20 NOTE — ED Notes (Signed)
 Pt discharged. Pt in NAD at this time. Pt given discharge papers and papers explained

## 2023-08-20 NOTE — ED Triage Notes (Signed)
 Pt c/o chest pain, says it started last night. Pt c/o shortness of breath and nausea, denies vomiting.

## 2023-08-20 NOTE — ED Provider Notes (Signed)
  EMERGENCY DEPARTMENT AT [facility] Provider Note   CSN: [id number] Arrival date & time: 08/20/23  9388     Patient presents with: Chest Pain   Angela [name] is a 73 y.o. female past medical history significant for COPD, hiatal hernia, hypothyroidism, diverticulosis, bipolar 1 disorder and homelessness who presents emergency department for chest pain.  Patient states that she has a known history of intermittent chest pain however she has stated that over the past 24 hours this chest pain has become more frequent and worsened with exertion.  Patient endorses associated shortness of breath as well as lower extremity swelling, nausea and orthopnea.  She endorses fevers during previous hospitalizations for diverticulitis however denies current fevers.  Denies syncope.  Patient states that being outside has worsened her intermittent episodes of substernal and left upper quadrant chest pain that is described as sharp without radiation.  However, does endorse episodes of heart racing.    Chest Pain      Prior to Admission medications   Medication Sig Start Date End Date Taking? Authorizing Provider  acetaminophen  (TYLENOL ) 650 MG CR tablet Take 650 mg by mouth every 8 (eight) hours as needed for pain.    [provider]  Acetylcysteine (NAC) 500 MG CAPS Take 500 mg by mouth 2 (two) times a day.    [provider]  albuterol  (VENTOLIN  HFA) 108 (90 Base) MCG/ACT inhaler albuterol  sulfate HFA 90 mcg/actuation aerosol inhaler  INHALE 2 PUFFS BY MOUTH EVERY 4 HOURS AS NEEDED 07/03/23   White, Adrienne R, NP  allopurinol  (ZYLOPRIM ) 100 MG tablet Take 1 tablet (100 mg total) by mouth daily. 04/25/23   Cyrena Mylar, MD  allopurinol  (ZYLOPRIM ) 100 MG tablet Take 1 tablet (100 mg total) by mouth daily. 07/03/23 10/01/23  Teresa Shelba SAUNDERS, NP  amLODipine  (NORVASC ) 5 MG tablet Take 1 tablet (5 mg total) by mouth daily. 05/07/23 05/06/24  Malvina Alm DASEN, MD  ascorbic  acid (VITAMIN C ) 1000 MG tablet Take 2,000 mg by mouth daily.    [provider]  benzonatate  (TESSALON  PERLES) 100 MG capsule Take 1 capsule (100 mg total) by mouth 3 (three) times daily as needed for cough. 06/13/23   Gordan Huxley, MD  calcium  carbonate (OSCAL) 1500 (600 Ca) MG TABS tablet Calcium  600 mg calcium  (1,500 mg) tablet  Take 1 tablet 3 times a day by oral route.    [provider]  Cyanocobalamin  (VITAMIN B-12) 1000 MCG SUBL Place 1,000 mcg under the tongue.    [provider]  diclofenac  Sodium (VOLTAREN ) 1 % GEL Apply 4 g topically 4 (four) times daily. 01/31/23   McDonald, Juliene SAUNDERS, DPM  dicyclomine  (BENTYL ) 20 MG tablet Take 1 tablet (20 mg total) by mouth 3 (three) times daily before meals. 07/08/23 08/08/23  Lenon Marien CROME, MD  EPINEPHrine  0.3 mg/0.3 mL IJ SOAJ injection Inject into the skin. 03/01/22   [provider]  estradiol  (ESTRACE ) 0.1 MG/GM vaginal cream Place 1 Applicatorful vaginally daily.    [provider]  famotidine  (PEPCID ) 20 MG tablet Take 1 tablet (20 mg total) by mouth 2 (two) times daily as needed for heartburn or indigestion. 11/26/18   Marylynn Verneita CROME, MD  levalbuterol  (XOPENEX ) 1.25 MG/3ML nebulizer solution Take 3 mLs by nebulization every 6 (six) hours as needed for wheezing.    [provider]  levothyroxine  (SYNTHROID ) 112 MCG tablet Take 1 tablet (112 mcg total) by mouth daily. 08/20/23 09/19/23  Nada Chroman, DO  Lidocaine  4 % PTCH     [provider]  LORazepam  (ATIVAN ) 1 MG tablet Take 1 mg by mouth every 4 (four) hours as needed for sleep. 02/06/18   [provider]  Magnesium  500 MG TABS Take 500 mg by mouth every other day.    [provider]  mupirocin  ointment (BACTROBAN ) 2 % Apply 1 application  topically 3 (three) times daily. Patient not taking: Reported on 07/04/2023 02/04/19   [provider]  Nutritional Supplements (ENSURE PLANT-BASED PROTEIN) LIQD Take  1 Units by mouth as needed. 06/14/23   Cyrena Mylar, MD  ondansetron  (ZOFRAN -ODT) 4 MG disintegrating tablet Take 1 tablet (4 mg total) by mouth every 8 (eight) hours as needed for nausea or vomiting. 04/25/23   Cyrena Mylar, MD  pantoprazole  (PROTONIX ) 40 MG tablet Take 1 tablet (40 mg total) by mouth daily. 06/14/23 06/13/24  Cyrena Mylar, MD  PARoxetine  (PAXIL ) 20 MG tablet Take 1 tablet (20 mg total) by mouth daily. 07/08/23 09/07/23  Lenon Marien CROME, MD  prednisoLONE acetate (PRED FORTE) 1 % ophthalmic suspension  07/26/18   [provider]  pyridoxine  (B-6) 100 MG tablet Take 1 tablet (100 mg total) by mouth 2 (two) times daily. Patient taking differently: Take 500 mg by mouth in the morning and at bedtime. 12/26/17   Clapacs, Norleen DASEN, MD  Senna-Natural Laxatives (SENOKOT LAXATIVE) Tea Bag MISC     [provider]  sucralfate  (CARAFATE ) 1 GM/10ML suspension Take 10 mLs (1 g total) by mouth 4 (four) times daily. 04/25/23 04/24/24  Cyrena Mylar, MD  terconazole  (TERAZOL 7 ) 0.4 % vaginal cream     [provider]  Tiotropium Bromide  Monohydrate 2.5 MCG/ACT AERS Inhale into the lungs. 04/22/22 02/19/24  [provider]  traZODone  (DESYREL ) 50 MG tablet Take 0.5 tablets (25 mg total) by mouth at bedtime for 5 days. 07/13/23 07/18/23  Sponseller, Pleasant SAUNDERS, PA-C  valsartan  (DIOVAN ) 320 MG tablet Take 1 tablet (320 mg total) by mouth daily. 07/08/23 09/07/23  Lenon Marien CROME, MD  vitamin C  (ASCORBIC ACID ) 250 MG tablet Take 1 tablet (250 mg total) by mouth daily. 05/07/23   Malvina Alm DASEN, MD  Vitamin D , Ergocalciferol , (DRISDOL ) 1.25 MG (50000 UNIT) CAPS capsule Take 1 capsule (50,000 Units total) by mouth every 7 (seven) days. Patient taking differently: Take 5,000 Units by mouth every 7 (seven) days. 05/19/19   Justus Leita DEL, MD  XIIDRA 5 % SOLN Place 1 drop into both eyes 2 (two) times daily. 07/27/18   [provider]    Allergies: Amoxicillin -pot clavulanate,  Aspirin, Bee venom, Cephalexin , Ciprofloxacin , Fluticasone , Haloperidol , Molds & smuts, Neomycin-polymyxin-dexameth, Other, Peanuts [peanut oil], Penicillins, Propoxyphene, Sulfa antibiotics, Sulfacetamide sodium, Tramadol, Amikacin, Bee pollen, Imipramine, Oxycodone, Prednisone, Colchicine , Hymenoptera venom preparations, Lactose intolerance (gi), Peanut-containing drug products, Phthalylsulfacetamide, Pine, Pregabalin, Risperidone, Sulfacetamide, Wound dressing adhesive, Adhesive [tape], Losartan , and Vistaril  [hydroxyzine  pamoate]    Review of Systems  Cardiovascular:  Positive for chest pain.    Updated Vital Signs BP 138/79   Pulse 71   Temp 97.8 F (36.6 C) (Oral)   Resp 16   Ht 5' 2 (1.575 m)   Wt 60 kg   SpO2 100%   BMI 24.19 kg/m   Physical Exam Vitals and nursing note reviewed.  Constitutional:      General: She is awake. She is not in acute distress.    Appearance: She is not ill-appearing.  HENT:     Head: Normocephalic and atraumatic.  Neck:     Vascular: No JVD.     Trachea: Trachea normal.  Cardiovascular:     Rate and Rhythm: Normal rate and regular rhythm.     Pulses:          Radial pulses are 2+ on the right side and 2+ on the left side.     Heart sounds: Normal heart sounds. No murmur heard. Pulmonary:     Effort: Pulmonary effort is normal. No tachypnea.     Breath sounds: Normal breath sounds. No wheezing.  Musculoskeletal:     Right lower leg: No edema.     Left lower leg: No edema.  Neurological:     Mental Status: She is alert and oriented to person, place, and time.  Psychiatric:        Behavior: Behavior is cooperative.     (all labs ordered are listed, but only abnormal results are displayed) Labs Reviewed  CBC - Abnormal; Notable for the following components:      Result Value   Hemoglobin 11.7 (*)    All other components within normal limits  T4, FREE - Abnormal; Notable for the following components:   Free T4 0.55 (*)    All other  components within normal limits  TSH - Abnormal; Notable for the following components:   TSH 35.070 (*)    All other components within normal limits  BASIC METABOLIC PANEL WITH GFR  MAGNESIUM   BRAIN NATRIURETIC PEPTIDE  TROPONIN I (HIGH SENSITIVITY)  TROPONIN I (HIGH SENSITIVITY)    EKG: EKG Interpretation Date/Time:  Monday August 20 2023 06:40:31 EDT Ventricular Rate:  60 PR Interval:  158 QRS Duration:  90 QT Interval:  416 QTC Calculation: 416 R Axis:   88  Text Interpretation: Sinus rhythm with Premature atrial complexes Possible Left atrial enlargement Borderline ECG When compared with ECG of 13-Jul-2023 17:47, PREVIOUS ECG IS PRESENT Confirmed by Simon Rea [phone]) on 08/20/2023 7:31:04 AM  Radiology: [facility] Chest 2 View Result Date: 08/20/2023 EXAM: 2 VIEW(S) XRAY OF THE CHEST 08/20/2023 06:54:00 AM COMPARISON: PA and lateral radiographs of the chest dated 07/11/2023. CLINICAL HISTORY: Chest pain. Reason for exam: chest pain. Patient complains of chest pain, says it started last night. Patient complains of shortness of breath and nausea, denies vomiting. FINDINGS: LUNGS AND PLEURA: No focal pulmonary opacity. No pulmonary edema. No pleural effusion. No pneumothorax. HEART AND MEDIASTINUM: No acute abnormality of the cardiac and mediastinal silhouettes. Moderate hiatus hernia which is filled with air. BONES AND SOFT TISSUES: No acute osseous abnormality. IMPRESSION: 1. No acute findings. 2. Moderate hiatus hernia filled with air. Electronically signed by: evalene coho 08/20/2023 07:16 AM EDT RP Workstation: [id number]     Procedures   Medications Ordered in the ED  dicyclomine  (BENTYL ) capsule 10 mg (10 mg Oral Given 08/20/23 0743)    Clinical Course as of 08/20/23 1537  Mon Aug 20, 2023  0712 EKG reviewed by me: Normal sinus rhythm with normal axis and intervals.  No evidence of acute ischemic change, heart block or dysrhythmia, PAC present [AG]  0713 Chest x-ray reviewed  by me: Trachea is midline.  No evidence of pneumothorax, pleural effusion or pulmonary edema.  No cardiomegaly.  Large hiatal hernia with gaseous distention noted and similar to prior chest x-ray.  No obvious focal consolidation. [AG]    Clinical Course User Index [AG] Willowdean Luhmann, DO  Medical Decision Making Amount and/or Complexity of Data Reviewed Labs: ordered. Radiology: ordered.  Risk Prescription drug management.   On initial evaluation patient is hemodynamically stable, afebrile and not in acute distress.  Based upon patient's history and physical examination findings differential diagnoses include ACS/MI, pneumothorax, pneumonia, acute heart failure, DVT/PE, dehydration, infection, myocarditis/pericarditis.  Will obtain laboratory studies including thyroid  levels and magnesium  as patient has a history of hypothyroidism and is endorsing palpitations.  Chest x-ray reviewed without evidence of focal consolidation consistent with pneumonia, pneumothorax, pleural effusion or pulmonary edema.  EKG without arrhythmia, acute ischemic change or ST changes consistent with myocarditis/pericarditis.  As patient is afebrile and hemodynamically stable without positional chest pain, less likely myocarditis/pericarditis.  Patient's Wells score 0 there is a pulmonary embolism unlikely and do not believe further imaging studies are necessary at this time.  Laboratory studies significant for elevated TSH and decreased T4.  Troponins within normal limits, no evidence of electrolyte abnormalities, anemia, leukocytosis, renal impairment or elevation in BNP.  Based upon chart review, patient has chronically elevated TSH.  Discussion held with patient and she is not currently taking Synthroid  for hypothyroidism secondary to inability to obtain medication.  As patient is hemodynamically stable, afebrile and not in acute distress believe patient is safe to be discharged.  Patient  will be given 1 month supply of Synthroid  at discharge with strict return precautions and recommendations to follow-up with patient's primary care provider.     Final diagnoses:  Chest pain, unspecified type    ED Discharge Orders          Ordered    levothyroxine  (SYNTHROID ) 112 MCG tablet  Daily        08/20/23 1026           Lavanda Bolster DO Emergency Medicine PGY2    Bolster Lavanda, DO 08/20/23 1537    Simon Lavonia SAILOR, MD 08/21/23 [phone]

## 2023-08-20 NOTE — Discharge Instructions (Signed)
 Thank you for allowing us  to care for you today.  I have refilled her Synthroid  and you may pick it up at our pharmacy.  Please be sure to follow-up with your primary care doctor, if you do not have a primary care doctor I have given you the phone number of our family medicine residency program to establish care.  Please return to the emergency department if you experience worsening pain, shortness of breath, passout or believe you need emergent medical care.  We hope you feel better soon Angela Bolster DO

## 2023-08-20 NOTE — ED Notes (Signed)
 Pt used restroom before coming back to triage cannot get sample at this time.  She stated that she is here because she needs help with housing she can't stay out in the streets no more.

## 2023-08-20 NOTE — ED Triage Notes (Signed)
 The pt feels like she is dehydrated and she needs to get some rest.  Her feet and ankles are painful

## 2023-08-20 NOTE — ED Provider Notes (Signed)
 [place] EMERGENCY DEPARTMENT AT [facility] Provider Note  CSN: [id number] Arrival date & time: 08/19/23 2333  Chief Complaint(s) Homeless  HPI Angela [name] is a 73 y.o. female who presents emergency room for evaluation of needing to rest.  Patient is homeless and has a history of frequent ER presentations.  Patient states that she came to the emergency room and because she would like to get out of the elements and rest.  Also states that she has been having dry eyes and allergies.  Denies any additional medical complaints today.   Past Medical History Past Medical History:  Diagnosis Date   Agitation 09/18/2017   Allergic rhinitis    Altered mental status, unspecified 12/25/2018   Anemia    Blurred vision    Chest pain of uncertain etiology 05/31/2018   Confusion state 12/25/2018   COPD (chronic obstructive pulmonary disease) (HCC)    Depression    Diverticulosis    Endometriosis    Falls    GERD (gastroesophageal reflux disease)    Hernia, hiatal    Hypothyroidism    IBS (irritable bowel syndrome)    Labile mood    Malignant neoplasm of skin    Malnutrition of mild degree (HCC) 08/08/2018   Migraine    Neuropathy    Noncompliance 12/25/2017   PTSD (post-traumatic stress disorder)    SIADH (syndrome of inappropriate ADH production) (HCC) 09/08/2016   Thyroid  disease    Patient Active Problem List   Diagnosis Date Noted   Diverticulitis 07/04/2023   Stress reaction causing mixed disturbance of emotion and conduct 09/10/2021   Homeless    Mixed hyperlipidemia 07/02/2019   Hypothyroidism due to acquired atrophy of thyroid  04/29/2019   Osteopenia determined by x-ray 04/29/2019   Acquired claw toe of both feet 10/23/2018   Aortic atherosclerosis (HCC) 05/31/2018   Achilles tendon disorder 04/25/2018   Diastolic dysfunction 03/29/2018   Mitral regurgitation 03/29/2018   Personality disorder (HCC) 12/25/2017   Arthritis of hand, degenerative 12/07/2017    Hallux valgus, acquired 07/05/2017   Hammer toes of both feet 07/05/2017   Unstable ankle 07/05/2017   Tinea unguium 05/08/2017   Conductive hearing loss of right ear with unrestricted hearing of left ear 04/03/2017   Adjustment disorder with depressed mood 01/18/2017   Migraine 01/18/2017   Irritable bowel syndrome with constipation 11/17/2016   Bilateral carpal tunnel syndrome 11/17/2016   Plantar fasciitis, bilateral 11/17/2016   Cervical myofascial pain syndrome 11/17/2016   Chronic hyponatremia 11/17/2016   PTSD (post-traumatic stress disorder) 11/17/2016   Hiatal hernia 11/17/2016   Vitamin D  deficiency 11/17/2016   Vitamin B12 deficiency 11/17/2016   Cervical spondylosis with myelopathy 05/24/2016   MCI (mild cognitive impairment) with memory loss 03/30/2016   Gait abnormality 03/30/2016   Chronic bipolar affective disorder (HCC) 01/24/2016   Anemia, iron deficiency 06/24/2015   Benign essential HTN 06/24/2015   History of falling 05/22/2015   History of TIA (transient ischemic attack) 04/20/2015   Gastroesophageal reflux disease with hiatal hernia 04/20/2015   Cervical disc disorder at C5-C6 level with radiculopathy 03/25/2015   Chronic neck pain 03/25/2015   Pelvic pain in female 10/30/2014   History of skin cancer 06/30/2014   Chronic headaches 12/16/2013   Neuropathy 12/16/2013   Home Medication(s) Prior to Admission medications   Medication Sig Start Date End Date Taking? Authorizing Provider  acetaminophen  (TYLENOL ) 650 MG CR tablet Take 650 mg by mouth every 8 (eight) hours as needed for pain.  [provider]  Acetylcysteine (NAC) 500 MG CAPS Take 500 mg by mouth 2 (two) times a day.    [provider]  albuterol  (VENTOLIN  HFA) 108 (90 Base) MCG/ACT inhaler albuterol  sulfate HFA 90 mcg/actuation aerosol inhaler  INHALE 2 PUFFS BY MOUTH EVERY 4 HOURS AS NEEDED 07/03/23   White, Adrienne R, NP  allopurinol  (ZYLOPRIM ) 100 MG tablet Take 1 tablet  (100 mg total) by mouth daily. 04/25/23   Cyrena Mylar, MD  allopurinol  (ZYLOPRIM ) 100 MG tablet Take 1 tablet (100 mg total) by mouth daily. 07/03/23 10/01/23  Teresa Shelba SAUNDERS, NP  amLODipine  (NORVASC ) 5 MG tablet Take 1 tablet (5 mg total) by mouth daily. 05/07/23 05/06/24  Malvina Alm DASEN, MD  ascorbic acid  (VITAMIN C ) 1000 MG tablet Take 2,000 mg by mouth daily.    [provider]  benzonatate  (TESSALON  PERLES) 100 MG capsule Take 1 capsule (100 mg total) by mouth 3 (three) times daily as needed for cough. 06/13/23   Gordan Huxley, MD  calcium  carbonate (OSCAL) 1500 (600 Ca) MG TABS tablet Calcium  600 mg calcium  (1,500 mg) tablet  Take 1 tablet 3 times a day by oral route.    [provider]  Cyanocobalamin  (VITAMIN B-12) 1000 MCG SUBL Place 1,000 mcg under the tongue.    [provider]  diclofenac  Sodium (VOLTAREN ) 1 % GEL Apply 4 g topically 4 (four) times daily. 01/31/23   McDonald, Juliene SAUNDERS, DPM  dicyclomine  (BENTYL ) 20 MG tablet Take 1 tablet (20 mg total) by mouth 3 (three) times daily before meals. 07/08/23 08/08/23  Lenon Marien CROME, MD  EPINEPHrine  0.3 mg/0.3 mL IJ SOAJ injection Inject into the skin. 03/01/22   [provider]  estradiol  (ESTRACE ) 0.1 MG/GM vaginal cream Place 1 Applicatorful vaginally daily.    [provider]  famotidine  (PEPCID ) 20 MG tablet Take 1 tablet (20 mg total) by mouth 2 (two) times daily as needed for heartburn or indigestion. 11/26/18   Marylynn Verneita CROME, MD  levalbuterol  (XOPENEX ) 1.25 MG/3ML nebulizer solution Take 3 mLs by nebulization every 6 (six) hours as needed for wheezing.    [provider]  levothyroxine  (SYNTHROID ) 112 MCG tablet Take 1 tablet (112 mcg total) by mouth daily. 07/03/23 10/01/23  Teresa Shelba SAUNDERS, NP  Lidocaine  4 % PTCH     [provider]  LORazepam  (ATIVAN ) 1 MG tablet Take 1 mg by mouth every 4 (four) hours as needed for sleep. 02/06/18   [provider]  Magnesium  500  MG TABS Take 500 mg by mouth every other day.    [provider]  mupirocin  ointment (BACTROBAN ) 2 % Apply 1 application  topically 3 (three) times daily. Patient not taking: Reported on 07/04/2023 02/04/19   [provider]  Nutritional Supplements (ENSURE PLANT-BASED PROTEIN) LIQD Take 1 Units by mouth as needed. 06/14/23   Cyrena Mylar, MD  ondansetron  (ZOFRAN -ODT) 4 MG disintegrating tablet Take 1 tablet (4 mg total) by mouth every 8 (eight) hours as needed for nausea or vomiting. 04/25/23   Cyrena Mylar, MD  pantoprazole  (PROTONIX ) 40 MG tablet Take 1 tablet (40 mg total) by mouth daily. 06/14/23 06/13/24  Cyrena Mylar, MD  PARoxetine  (PAXIL ) 20 MG tablet Take 1 tablet (20 mg total) by mouth daily. 07/08/23 09/07/23  Lenon Marien CROME, MD  prednisoLONE acetate (PRED FORTE) 1 % ophthalmic suspension  07/26/18   [provider]  pyridoxine  (B-6) 100 MG tablet Take 1 tablet (100 mg total) by mouth 2 (  two) times daily. Patient taking differently: Take 500 mg by mouth in the morning and at bedtime. 12/26/17   Clapacs, Norleen DASEN, MD  Senna-Natural Laxatives (SENOKOT LAXATIVE) Tea Bag MISC     [provider]  sucralfate  (CARAFATE ) 1 GM/10ML suspension Take 10 mLs (1 g total) by mouth 4 (four) times daily. 04/25/23 04/24/24  Cyrena Mylar, MD  terconazole  (TERAZOL 7 ) 0.4 % vaginal cream     [provider]  Tiotropium Bromide  Monohydrate 2.5 MCG/ACT AERS Inhale into the lungs. 04/22/22 02/19/24  [provider]  traZODone  (DESYREL ) 50 MG tablet Take 0.5 tablets (25 mg total) by mouth at bedtime for 5 days. 07/13/23 07/18/23  Sponseller, Pleasant SAUNDERS, PA-C  valsartan  (DIOVAN ) 320 MG tablet Take 1 tablet (320 mg total) by mouth daily. 07/08/23 09/07/23  Lenon Marien CROME, MD  vitamin C  (ASCORBIC ACID ) 250 MG tablet Take 1 tablet (250 mg total) by mouth daily. 05/07/23   Malvina Alm DASEN, MD  Vitamin D , Ergocalciferol , (DRISDOL ) 1.25 MG (50000 UNIT) CAPS capsule Take 1 capsule  (50,000 Units total) by mouth every 7 (seven) days. Patient taking differently: Take 5,000 Units by mouth every 7 (seven) days. 05/19/19   Justus Leita DEL, MD  XIIDRA 5 % SOLN Place 1 drop into both eyes 2 (two) times daily. 07/27/18   [provider]                                                                                                                                    Past Surgical History Past Surgical History:  Procedure Laterality Date   APPENDECTOMY     CHOLECYSTECTOMY     CHOLECYSTECTOMY, LAPAROSCOPIC     ESOPHAGOGASTRODUODENOSCOPY (EGD) WITH PROPOFOL  N/A 03/29/2015   Procedure: ESOPHAGOGASTRODUODENOSCOPY (EGD) WITH PROPOFOL ;  Surgeon: Donnice Vaughn Manes, MD;  Location: [facility] ENDOSCOPY;  Service: Endoscopy;  Laterality: N/A;   HERNIA REPAIR     laproscopy     TONSILLECTOMY     uteral suspension     Family History Family History  Problem Relation Age of Onset   Heart disease Father    Hearing loss Father    COPD Father    Depression Father    Stroke Father    Vision loss Father    Varicose Veins Father    Cancer Mother        lung   Depression Sister    Arthritis Sister    Arthritis Brother    Hernia Brother    Anxiety disorder Brother    Arthritis Brother    Hernia Brother    Anxiety disorder Brother    Urolithiasis Neg Hx    Kidney disease Neg Hx    Kidney cancer Neg Hx    Prostate cancer Neg Hx    Breast cancer Neg Hx     Social History Social History   Tobacco Use   Smoking status: Former    Current  packs/day: 0.00    Types: Cigarettes    Quit date: 11/17/1976    Years since quitting: 46.7   Smokeless tobacco: Never  Vaping Use   Vaping status: Never Used  Substance Use Topics   Alcohol  use: Not Currently    Comment: occ   Drug use: No   Allergies Amoxicillin -pot clavulanate, Aspirin, Bee venom, Cephalexin , Ciprofloxacin , Fluticasone , Haloperidol , Molds & smuts, Neomycin-polymyxin-dexameth, Other, Peanuts [peanut oil],  Penicillins, Propoxyphene, Sulfa antibiotics, Sulfacetamide sodium, Tramadol, Amikacin, Bee pollen, Imipramine, Oxycodone, Prednisone, Colchicine , Hymenoptera venom preparations, Lactose intolerance (gi), Peanut-containing drug products, Phthalylsulfacetamide, Pine, Pregabalin, Risperidone, Sulfacetamide, Wound dressing adhesive, Adhesive [tape], Losartan , and Vistaril  [hydroxyzine  pamoate]  Review of Systems Review of Systems  Eyes:  Positive for itching.    Physical Exam Vital Signs  I have reviewed the triage vital signs BP (!) 172/75 (BP Location: Right Arm)   Pulse (!) 59   Temp 97.6 F (36.4 C)   Resp 14   Ht 5' 2 (1.575 m)   Wt 59 kg   SpO2 100%   BMI 23.79 kg/m   Physical Exam Vitals and nursing note reviewed.  Constitutional:      General: She is not in acute distress.    Appearance: She is well-developed.  HENT:     Head: Normocephalic and atraumatic.  Eyes:     Conjunctiva/sclera: Conjunctivae normal.  Cardiovascular:     Rate and Rhythm: Normal rate and regular rhythm.     Heart sounds: No murmur heard. Pulmonary:     Effort: Pulmonary effort is normal. No respiratory distress.     Breath sounds: Normal breath sounds.  Abdominal:     Palpations: Abdomen is soft.     Tenderness: There is no abdominal tenderness.  Musculoskeletal:        General: No swelling.     Cervical back: Neck supple.  Skin:    General: Skin is warm and dry.     Capillary Refill: Capillary refill takes less than 2 seconds.  Neurological:     Mental Status: She is alert.  Psychiatric:        Mood and Affect: Mood normal.     ED Results and Treatments Labs (all labs ordered are listed, but only abnormal results are displayed) Labs Reviewed  COMPREHENSIVE METABOLIC PANEL WITH GFR - Abnormal; Notable for the following components:      Result Value   Potassium 3.3 (*)    Glucose, Bld 112 (*)    Total Protein 6.1 (*)    Albumin 3.4 (*)    All other components within normal  limits  CBC - Abnormal; Notable for the following components:   RBC 3.49 (*)    Hemoglobin 10.6 (*)    HCT 31.6 (*)    All other components within normal limits  LIPASE, BLOOD  Radiology No results found.  Pertinent labs & imaging results that were available during my care of the patient were reviewed by me and considered in my medical decision making (see MDM for details).  Medications Ordered in ED Medications  amLODipine  (NORVASC ) tablet 5 mg (5 mg Oral Given 08/20/23 0214)  artificial tears (LACRILUBE) ophthalmic ointment 1 Application (1 Application Both Eyes Not Given 08/20/23 0217)  potassium chloride  SA (KLOR-CON  M) CR tablet 40 mEq (40 mEq Oral Given 08/20/23 0214)  magnesium  oxide (MAG-OX) tablet 800 mg (800 mg Oral Given 08/20/23 0214)                                                                                                                                     Procedures Procedures  (including critical care time)  Medical Decision Making / ED Course   This patient presents to the ED for concern of fatigue, eye itching, this involves an extensive number of treatment options, and is a complaint that carries with it a high risk of complications and morbidity.  The differential diagnosis includes allergic conjunctivitis, housing instability, secondary gain, heat exposure  MDM: Patient seen emerged part for evaluation of fatigue.  Physical exam is unremarkable.  Laboratory valuation with hemoglobin of 10.6, potassium 3.3 which was repleted in the emergency department but is otherwise unremarkable.  Patient brought her blood pressure medications and was given food and water.  At this time she does not meet inpatient criteria for admission and will be discharged with outpatient follow-up.  Patient given resources for housing instability.   Additional history  obtained:  -External records from outside source obtained and reviewed including: Chart review including previous notes, labs, imaging, consultation notes   Lab Tests: -I ordered, reviewed, and interpreted labs.   The pertinent results include:   Labs Reviewed  COMPREHENSIVE METABOLIC PANEL WITH GFR - Abnormal; Notable for the following components:      Result Value   Potassium 3.3 (*)    Glucose, Bld 112 (*)    Total Protein 6.1 (*)    Albumin 3.4 (*)    All other components within normal limits  CBC - Abnormal; Notable for the following components:   RBC 3.49 (*)    Hemoglobin 10.6 (*)    HCT 31.6 (*)    All other components within normal limits  LIPASE, BLOOD        Medicines ordered and prescription drug management: Meds ordered this encounter  Medications   amLODipine  (NORVASC ) tablet 5 mg   DISCONTD: irbesartan  (AVAPRO ) tablet 300 mg   potassium chloride  SA (KLOR-CON  M) CR tablet 40 mEq   magnesium  oxide (MAG-OX) tablet 800 mg   artificial tears (LACRILUBE) ophthalmic ointment 1 Application    -I have reviewed the patients home medicines and have made adjustments as needed  Critical interventions none   Social Determinants of Health:  Factors impacting patients care include: Homeless  Reevaluation: After the interventions noted above, I reevaluated the patient and found that they have :stayed the same  Co morbidities that complicate the patient evaluation  Past Medical History:  Diagnosis Date   Agitation 09/18/2017   Allergic rhinitis    Altered mental status, unspecified 12/25/2018   Anemia    Blurred vision    Chest pain of uncertain etiology 05/31/2018   Confusion state 12/25/2018   COPD (chronic obstructive pulmonary disease) (HCC)    Depression    Diverticulosis    Endometriosis    Falls    GERD (gastroesophageal reflux disease)    Hernia, hiatal    Hypothyroidism    IBS (irritable bowel syndrome)    Labile mood    Malignant neoplasm of  skin    Malnutrition of mild degree (HCC) 08/08/2018   Migraine    Neuropathy    Noncompliance 12/25/2017   PTSD (post-traumatic stress disorder)    SIADH (syndrome of inappropriate ADH production) (HCC) 09/08/2016   Thyroid  disease       Dispostion: I considered admission for this patient, but at this time she does not meet inpatient criteria for admission will be discharged with outpatient follow-up     Final Clinical Impression(s) / ED Diagnoses Final diagnoses:  Homelessness  Secondary hypertension     @PCDICTATION @    Albertina Dixon, MD 08/20/23 [phone]

## 2023-08-20 NOTE — ED Triage Notes (Signed)
 The pt is homeless and she reports that she has fatigue and she is tired of being outside she ahs not eaten and she wants food and she reports that her bp is high  and she wants to plan for the future

## 2023-08-20 NOTE — ED Notes (Signed)
 Staff went and retrieved prescription from [facility] pharmacy.

## 2023-08-20 NOTE — ED Notes (Signed)
 Patient transported to X-ray

## 2023-08-21 ENCOUNTER — Emergency Department (HOSPITAL_COMMUNITY)
Admission: EM | Admit: 2023-08-21 | Discharge: 2023-08-22 | Disposition: A | Discharge status: Home / Self Care | Source: Home / Self Care | Attending: Emergency Medicine | Admitting: Emergency Medicine

## 2023-08-21 ENCOUNTER — Other Ambulatory Visit: Payer: Self-pay

## 2023-08-21 ENCOUNTER — Emergency Department (HOSPITAL_COMMUNITY)
Admission: EM | Admit: 2023-08-21 | Discharge: 2023-08-21 | Discharge status: Against Medical Advice | Source: Home / Self Care

## 2023-08-21 ENCOUNTER — Emergency Department (HOSPITAL_COMMUNITY)
Admission: EM | Admit: 2023-08-21 | Discharge: 2023-08-21 | Disposition: A | Discharge status: Home / Self Care | Attending: Emergency Medicine | Admitting: Emergency Medicine

## 2023-08-21 ENCOUNTER — Encounter (HOSPITAL_COMMUNITY): Payer: Self-pay | Admitting: Emergency Medicine

## 2023-08-21 ENCOUNTER — Encounter (HOSPITAL_COMMUNITY): Payer: Self-pay

## 2023-08-21 ENCOUNTER — Emergency Department (HOSPITAL_COMMUNITY)

## 2023-08-21 DIAGNOSIS — J449 Chronic obstructive pulmonary disease, unspecified: Secondary | ICD-10-CM | POA: Insufficient documentation

## 2023-08-21 DIAGNOSIS — Z91010 Allergy to peanuts: Secondary | ICD-10-CM | POA: Insufficient documentation

## 2023-08-21 DIAGNOSIS — Z7951 Long term (current) use of inhaled steroids: Secondary | ICD-10-CM | POA: Insufficient documentation

## 2023-08-21 DIAGNOSIS — E039 Hypothyroidism, unspecified: Secondary | ICD-10-CM | POA: Diagnosis not present

## 2023-08-21 DIAGNOSIS — Z5321 Procedure and treatment not carried out due to patient leaving prior to being seen by health care provider: Secondary | ICD-10-CM | POA: Insufficient documentation

## 2023-08-21 DIAGNOSIS — Z5900 Homelessness unspecified: Secondary | ICD-10-CM | POA: Insufficient documentation

## 2023-08-21 DIAGNOSIS — L299 Pruritus, unspecified: Secondary | ICD-10-CM | POA: Insufficient documentation

## 2023-08-21 DIAGNOSIS — W57XXXA Bitten or stung by nonvenomous insect and other nonvenomous arthropods, initial encounter: Secondary | ICD-10-CM | POA: Diagnosis not present

## 2023-08-21 DIAGNOSIS — R0602 Shortness of breath: Secondary | ICD-10-CM | POA: Diagnosis present

## 2023-08-21 DIAGNOSIS — S90569A Insect bite (nonvenomous), unspecified ankle, initial encounter: Secondary | ICD-10-CM | POA: Insufficient documentation

## 2023-08-21 DIAGNOSIS — R5383 Other fatigue: Secondary | ICD-10-CM | POA: Insufficient documentation

## 2023-08-21 LAB — BASIC METABOLIC PANEL WITH GFR
Anion gap: 8 (ref 5–15)
Anion gap: 9 (ref 5–15)
BUN: 13 mg/dL (ref 8–23)
BUN: 16 mg/dL (ref 8–23)
CO2: 23 mmol/L (ref 22–32)
CO2: 23 mmol/L (ref 22–32)
Calcium: 8.7 mg/dL — ABNORMAL LOW (ref 8.9–10.3)
Calcium: 9.3 mg/dL (ref 8.9–10.3)
Chloride: 105 mmol/L (ref 98–111)
Chloride: 103 mmol/L (ref 98–111)
Creatinine, Ser: 0.74 mg/dL (ref 0.44–1.00)
Creatinine, Ser: 0.59 mg/dL (ref 0.44–1.00)
GFR, Estimated: >60 mL/min (ref >60)
GFR, Estimated: >60 mL/min (ref >60)
Glucose, Bld: 101 mg/dL — ABNORMAL HIGH (ref 70–99)
Glucose, Bld: 72 mg/dL (ref 70–99)
Potassium: 3.7 mmol/L (ref 3.5–5.1)
Potassium: 3.3 mmol/L — ABNORMAL LOW (ref 3.5–5.1)
Sodium: 136 mmol/L (ref 135–145)
Sodium: 135 mmol/L (ref 135–145)

## 2023-08-21 LAB — CBC WITH DIFFERENTIAL/PLATELET
Abs Immature Granulocytes: 0.02 K/uL (ref 0.00–0.07)
Basophils Absolute: 0.1 K/uL (ref 0.0–0.1)
Basophils Relative: 1 %
Eosinophils Absolute: 0.2 K/uL (ref 0.0–0.5)
Eosinophils Relative: 4 %
HCT: 33.4 % — ABNORMAL LOW (ref 36.0–46.0)
Hemoglobin: 11.2 g/dL — ABNORMAL LOW (ref 12.0–15.0)
Immature Granulocytes: 0 %
Lymphocytes Relative: 24 %
Lymphs Abs: 1.2 K/uL (ref 0.7–4.0)
MCH: 30 pg (ref 26.0–34.0)
MCHC: 33.5 g/dL (ref 30.0–36.0)
MCV: 89.5 fL (ref 80.0–100.0)
Monocytes Absolute: 0.6 K/uL (ref 0.1–1.0)
Monocytes Relative: 13 %
Neutro Abs: 2.8 K/uL (ref 1.7–7.7)
Neutrophils Relative %: 58 %
Platelets: 273 K/uL (ref 150–400)
RBC: 3.73 MIL/uL — ABNORMAL LOW (ref 3.87–5.11)
RDW: 14.2 % (ref 11.5–15.5)
WBC: 4.9 K/uL (ref 4.0–10.5)
nRBC: 0 % (ref 0.0–0.2)

## 2023-08-21 LAB — CBC
HCT: 40.1 % (ref 36.0–46.0)
Hemoglobin: 12.8 g/dL (ref 12.0–15.0)
MCH: 30 pg (ref 26.0–34.0)
MCHC: 31.9 g/dL (ref 30.0–36.0)
MCV: 93.9 fL (ref 80.0–100.0)
Platelets: 348 K/uL (ref 150–400)
RBC: 4.27 MIL/uL (ref 3.87–5.11)
RDW: 14.4 % (ref 11.5–15.5)
WBC: 5.8 K/uL (ref 4.0–10.5)
nRBC: 0 % (ref 0.0–0.2)

## 2023-08-21 LAB — TROPONIN I (HIGH SENSITIVITY)
Troponin I (High Sensitivity): 6 ng/L (ref <18)
Troponin I (High Sensitivity): 3 ng/L (ref <18)

## 2023-08-21 MED ORDER — ALBUTEROL SULFATE HFA 108 (90 BASE) MCG/ACT IN AERS: 1.0000 | INHALATION_SPRAY | RESPIRATORY_TRACT | Status: DC | PRN

## 2023-08-21 MED ORDER — TRIAMCINOLONE ACETONIDE 0.1 % EX CREA: 1.0000 | TOPICAL_CREAM | Freq: Two times a day (BID) | CUTANEOUS | 0 refills | Status: DC

## 2023-08-21 MED ORDER — TRIAMCINOLONE 0.1 % CREAM:EUCERIN CREAM 1:1
TOPICAL_CREAM | Freq: Once | CUTANEOUS | Status: AC
  Filled 2023-08-21: qty 60

## 2023-08-21 MED ORDER — POLYETHYLENE GLYCOL 3350 17 G PO PACK: 17.0000 g | PACK | Freq: Every day | ORAL | Status: DC

## 2023-08-21 MED ORDER — AEROCHAMBER PLUS FLO-VU MEDIUM MISC: 1.0000 | Freq: Once | Status: DC

## 2023-08-21 NOTE — ED Provider Notes (Signed)
  Physical Exam  BP (!) 145/79 (BP Location: Right Arm)   Pulse (!) 55   Temp 98.3 F (36.8 C)   Resp 17   SpO2 100%   Physical Exam Vitals and nursing note reviewed.  Constitutional:      General: She is not in acute distress.    Appearance: Normal appearance.  Eyes:     General:        Right eye: No discharge.        Left eye: No discharge.     Extraocular Movements: Extraocular movements intact.     Conjunctiva/sclera: Conjunctivae normal.  Cardiovascular:     Rate and Rhythm: Normal rate and regular rhythm.  Pulmonary:     Effort: Pulmonary effort is normal. No respiratory distress.  Abdominal:     General: Abdomen is flat.  Musculoskeletal:     Cervical back: Normal range of motion. No rigidity.  Skin:    General: Skin is warm and dry.  Neurological:     General: No focal deficit present.     Mental Status: She is alert and oriented to person, place, and time.     Motor: No weakness.     Gait: Gait normal.  Psychiatric:     Comments: Very combative and sarcastic     Procedures  Procedures  ED Course / MDM   Clinical Course as of 08/21/23 0815  Tue Aug 21, 2023  0631 3rd visit in 36 hours. Hx of malingering. Claimed dehydration. Provide resources for homelessness. Likely  [CB]    Clinical Course User Index [CB] Beola Terrall RAMAN, PA-C   Medical Decision Making Amount and/or Complexity of Data Reviewed Labs: ordered.  Risk Prescription drug management.   Patient care transferred over from [facility], [place].   At time of handoff, awaiting labs as he had not gotten them per her request for her concerns for possible dehydration.  Suspecting likely malingering as this is her third visit in 36 hours.  Previously worked up for chest pain, currently concern for bug bite.  Bug bite was provided triamcinolone  cream.  Labs were at baseline.  We are going to discussed with patient her lab findings and any other possible concerns, patient was very combative,  angry and sarcastic, making it impossible to engage in further discussion.  Will provide resources for her for housing.  Will send home with triamcinolone  cream.  Patient vital signs have remained stable throughout the course of patient's time in the ED. Low suspicion for any other emergent pathology at this time. I believe this patient is safe to be discharged. Provided strict return to ER precautions. Patient expressed agreement and understanding of plan. All questions were answered.      Beola Terrall RAMAN, PA-C 08/21/23 [id number]    Doretha Folks, MD 08/21/23 1400

## 2023-08-21 NOTE — ED Provider Triage Note (Signed)
 Emergency Medicine Provider Triage Evaluation Note  Angela [name] , a 73 y.o. female  was evaluated in triage.  Pt complains of sob.  Pt is homeless and has hx of COPD.  She said she's been having to sleep outside and it's wet which worsens her breathing.  Pt has been seen in the ED several times recently for the same.  She was seen here earlier today and was d/c at 0825.  She is also constipated and requesting a laxative.  Review of Systems  Positive: sob Negative: fevers  Physical Exam  BP (!) 136/100 (BP Location: Right Arm)   Pulse 76   Temp 98.6 F (37 C) (Oral)   Resp (!) 22   Ht 5' 2 (1.575 m)   Wt 59.9 kg   SpO2 100%   BMI 24.14 kg/m  Gen:   Awake, no distress   Resp:  Normal effort + wheezes.  O2 sat 100% MSK:   Moves extremities without difficulty  Other:    Medical Decision Making  Medically screening exam initiated at 5:14 PM.  Appropriate orders placed.  Amirra Herling was informed that the remainder of the evaluation will be completed by another provider, this initial triage assessment does not replace that evaluation, and the importance of remaining in the ED until their evaluation is complete.     Dean Clarity, MD 08/21/23 [phone]

## 2023-08-21 NOTE — ED Notes (Addendum)
 Crisis response spoke to this NT requesting that pt get some sort of social work consult. Stating that they have been attempting to get pt into housing but have had no success.

## 2023-08-21 NOTE — ED Notes (Addendum)
 Pt has not answered and Police state they saw her walk outside with all of her stuff

## 2023-08-21 NOTE — ED Triage Notes (Signed)
 Patient did not state any complaints at triage during encounter ,  I can't talk right know  , respirations unlabored/ambulatory .

## 2023-08-21 NOTE — ED Notes (Signed)
 PT is no longer in waiting room.

## 2023-08-21 NOTE — ED Triage Notes (Signed)
 Pt reports insect bites on ankles and swelling.

## 2023-08-21 NOTE — ED Notes (Signed)
 Patient given water to drink to stay hydrated.

## 2023-08-21 NOTE — Discharge Instructions (Addendum)
 You were seen today for insect bite.  Your labs and physical exam were both very reassuring and I have low suspicion for any emergent causes of your symptoms today.  Provided you with some cream today that you were provided here in the emergency department.  You can use this for the bug bite for the swelling you are talking about previously.  I have also provided some resources for you to contact.  Recommend [employer] [facility] as a particularly good 1 to get in contact with for housing and help.

## 2023-08-21 NOTE — ED Notes (Signed)
 Pt asked RN for underwear- given. When asking pt what she needs to see doctor for, she states she needs to go sit down and calm down right now and doesn't want to talk.

## 2023-08-21 NOTE — ED Provider Notes (Signed)
 [place] EMERGENCY DEPARTMENT AT [facility] Provider Note   CSN: [id number] Arrival date & time: 08/21/23  9985     Patient presents with: Insect Bite   Angela [name] is a 73 y.o. female.  Patient with past medical history significant for COPD, hypothyroidism, diverticulosis, bipolar 1 disorder, homelessness presents to the emergency department complaining of multiple complaints.  Her first complaint is insect bites around her ankles which are causing her legs to itch.  Her second complaint is feeling dehydrated from being exposed to the elements.  The patient is homeless and states she is outside throughout the course of the day and has had little water.  She believes she may be dehydrated.  She was evaluated earlier on 4 August at the same emergency department due to chest pain and earlier that same day requesting a place to rest.  She does have a history of frequent ER presentations and other emergency departments and has a history of malingering.   HPI     Prior to Admission medications   Medication Sig Start Date End Date Taking? Authorizing Provider  acetaminophen  (TYLENOL ) 650 MG CR tablet Take 650 mg by mouth every 8 (eight) hours as needed for pain.    [provider]  Acetylcysteine (NAC) 500 MG CAPS Take 500 mg by mouth 2 (two) times a day.    [provider]  albuterol  (VENTOLIN  HFA) 108 (90 Base) MCG/ACT inhaler albuterol  sulfate HFA 90 mcg/actuation aerosol inhaler  INHALE 2 PUFFS BY MOUTH EVERY 4 HOURS AS NEEDED 07/03/23   White, Adrienne R, NP  allopurinol  (ZYLOPRIM ) 100 MG tablet Take 1 tablet (100 mg total) by mouth daily. 04/25/23   Cyrena Mylar, MD  allopurinol  (ZYLOPRIM ) 100 MG tablet Take 1 tablet (100 mg total) by mouth daily. 07/03/23 10/01/23  Teresa Shelba SAUNDERS, NP  amLODipine  (NORVASC ) 5 MG tablet Take 1 tablet (5 mg total) by mouth daily. 05/07/23 05/06/24  Malvina Alm DASEN, MD  ascorbic acid  (VITAMIN C ) 1000 MG tablet Take 2,000 mg by  mouth daily.    [provider]  benzonatate  (TESSALON  PERLES) 100 MG capsule Take 1 capsule (100 mg total) by mouth 3 (three) times daily as needed for cough. 06/13/23   Gordan Huxley, MD  calcium  carbonate (OSCAL) 1500 (600 Ca) MG TABS tablet Calcium  600 mg calcium  (1,500 mg) tablet  Take 1 tablet 3 times a day by oral route.    [provider]  Cyanocobalamin  (VITAMIN B-12) 1000 MCG SUBL Place 1,000 mcg under the tongue.    [provider]  diclofenac  Sodium (VOLTAREN ) 1 % GEL Apply 4 g topically 4 (four) times daily. 01/31/23   McDonald, Juliene SAUNDERS, DPM  dicyclomine  (BENTYL ) 20 MG tablet Take 1 tablet (20 mg total) by mouth 3 (three) times daily before meals. 07/08/23 08/08/23  Lenon Marien CROME, MD  EPINEPHrine  0.3 mg/0.3 mL IJ SOAJ injection Inject into the skin. 03/01/22   [provider]  estradiol  (ESTRACE ) 0.1 MG/GM vaginal cream Place 1 Applicatorful vaginally daily.    [provider]  famotidine  (PEPCID ) 20 MG tablet Take 1 tablet (20 mg total) by mouth 2 (two) times daily as needed for heartburn or indigestion. 11/26/18   Marylynn Verneita CROME, MD  levalbuterol  (XOPENEX ) 1.25 MG/3ML nebulizer solution Take 3 mLs by nebulization every 6 (six) hours as needed for wheezing.    [provider]  levothyroxine  (SYNTHROID ) 112 MCG tablet Take 1 tablet (112 mcg total) by mouth daily. 08/20/23 09/19/23  Nada Chroman, DO  Lidocaine  4 % PTCH     [provider]  LORazepam  (ATIVAN ) 1 MG tablet Take 1 mg by mouth every 4 (four) hours as needed for sleep. 02/06/18   [provider]  Magnesium  500 MG TABS Take 500 mg by mouth every other day.    [provider]  mupirocin  ointment (BACTROBAN ) 2 % Apply 1 application  topically 3 (three) times daily. Patient not taking: Reported on 07/04/2023 02/04/19   [provider]  Nutritional Supplements (ENSURE PLANT-BASED PROTEIN) LIQD Take 1 Units by mouth as needed. 06/14/23   Cyrena Mylar, MD  ondansetron  (ZOFRAN -ODT) 4 MG disintegrating tablet Take 1 tablet (4 mg total) by mouth every 8 (eight) hours as needed for nausea or vomiting. 04/25/23   Cyrena Mylar, MD  pantoprazole  (PROTONIX ) 40 MG tablet Take 1 tablet (40 mg total) by mouth daily. 06/14/23 06/13/24  Cyrena Mylar, MD  PARoxetine  (PAXIL ) 20 MG tablet Take 1 tablet (20 mg total) by mouth daily. 07/08/23 09/07/23  Lenon Marien CROME, MD  prednisoLONE acetate (PRED FORTE) 1 % ophthalmic suspension  07/26/18   [provider]  pyridoxine  (B-6) 100 MG tablet Take 1 tablet (100 mg total) by mouth 2 (two) times daily. Patient taking differently: Take 500 mg by mouth in the morning and at bedtime. 12/26/17   Clapacs, Norleen DASEN, MD  Senna-Natural Laxatives (SENOKOT LAXATIVE) Tea Bag MISC     [provider]  sucralfate  (CARAFATE ) 1 GM/10ML suspension Take 10 mLs (1 g total) by mouth 4 (four) times daily. 04/25/23 04/24/24  Cyrena Mylar, MD  terconazole  (TERAZOL 7 ) 0.4 % vaginal cream     [provider]  Tiotropium Bromide  Monohydrate 2.5 MCG/ACT AERS Inhale into the lungs. 04/22/22 02/19/24  [provider]  traZODone  (DESYREL ) 50 MG tablet Take 0.5 tablets (25 mg total) by mouth at bedtime for 5 days. 07/13/23 07/18/23  Sponseller, Pleasant R, PA-C  valsartan  (DIOVAN ) 320 MG tablet Take 1 tablet (320 mg total) by mouth daily. 07/08/23 09/07/23  Lenon Marien CROME, MD  vitamin C  (ASCORBIC ACID ) 250 MG tablet Take 1 tablet (250 mg total) by mouth daily. 05/07/23   Malvina Alm DASEN, MD  Vitamin D , Ergocalciferol , (DRISDOL ) 1.25 MG (50000 UNIT) CAPS capsule Take 1 capsule (50,000 Units total) by mouth every 7 (seven) days. Patient taking differently: Take 5,000 Units by mouth every 7 (seven) days. 05/19/19   Justus Leita DEL, MD  XIIDRA 5 % SOLN Place 1 drop into both eyes 2 (two) times daily. 07/27/18   [provider]    Allergies: Amoxicillin -pot clavulanate, Aspirin, Bee venom, Cephalexin , Ciprofloxacin ,  Fluticasone , Haloperidol , Molds & smuts, Neomycin-polymyxin-dexameth, Other, Peanuts [peanut oil], Penicillins, Propoxyphene, Sulfa antibiotics, Sulfacetamide sodium, Tramadol, Amikacin, Bee pollen, Imipramine, Oxycodone, Prednisone, Colchicine , Hymenoptera venom preparations, Lactose intolerance (gi), Peanut-containing drug products, Phthalylsulfacetamide, Pine, Pregabalin, Risperidone, Sulfacetamide, Wound dressing adhesive, Adhesive [tape], Losartan , and Vistaril  [hydroxyzine  pamoate]    Review of Systems  Updated Vital Signs BP (!) 165/85 (BP Location: Right Arm)   Pulse 68   Temp 98.3 F (36.8 C)   Resp 16   SpO2 99%   Physical Exam Vitals and nursing note reviewed.  Constitutional:      General: She is awake. She is not in acute distress.    Appearance: She is not ill-appearing.  HENT:     Head: Normocephalic and atraumatic.  Neck:     Vascular: No JVD.     Trachea: Trachea normal.  Cardiovascular:  Rate and Rhythm: Normal rate and regular rhythm.     Pulses:          Radial pulses are 2+ on the right side and 2+ on the left side.     Heart sounds: Normal heart sounds. No murmur heard. Pulmonary:     Effort: Pulmonary effort is normal. No tachypnea.     Breath sounds: Normal breath sounds. No wheezing.  Musculoskeletal:     Right lower leg: No edema.     Left lower leg: No edema.  Neurological:     Mental Status: She is alert and oriented to person, place, and time.  Psychiatric:        Behavior: Behavior is cooperative.     (all labs ordered are listed, but only abnormal results are displayed) Labs Reviewed  BASIC METABOLIC PANEL WITH GFR - Abnormal; Notable for the following components:      Result Value   Glucose, Bld 101 (*)    Calcium  8.7 (*)    All other components within normal limits  CBC WITH DIFFERENTIAL/PLATELET    EKG: None  Radiology: DG Chest 2 View Result Date: 08/20/2023 EXAM: 2 VIEW(S) XRAY OF THE CHEST 08/20/2023 06:54:00 AM COMPARISON:  PA and lateral radiographs of the chest dated 07/11/2023. CLINICAL HISTORY: Chest pain. Reason for exam: chest pain. Patient complains of chest pain, says it started last night. Patient complains of shortness of breath and nausea, denies vomiting. FINDINGS: LUNGS AND PLEURA: No focal pulmonary opacity. No pulmonary edema. No pleural effusion. No pneumothorax. HEART AND MEDIASTINUM: No acute abnormality of the cardiac and mediastinal silhouettes. Moderate hiatus hernia which is filled with air. BONES AND SOFT TISSUES: No acute osseous abnormality. IMPRESSION: 1. No acute findings. 2. Moderate hiatus hernia filled with air. Electronically signed by: evalene coho 08/20/2023 07:16 AM EDT RP Workstation: [id number]     Procedures   Medications Ordered in the ED  triamcinolone  0.1 % cream : eucerin cream, 1:1 ( Topical Given 08/21/23 0508)    Clinical Course as of 08/21/23 0635  Tue Aug 21, 2023  0631 3rd visit in 36 hours. Hx of malingering. Claimed dehydration. Provide resources for homelessness. Likely  [CB]    Clinical Course User Index [CB] Beola Terrall RAMAN, PA-C                                 Medical Decision Making Amount and/or Complexity of Data Reviewed Labs: ordered.   Patient with chief complaint of itchy legs due to bug bites.  No signs of cellulitis or abscess.  Triamcinolone  ordered at patient's request.  I initially recommended Benadryl  cream.  She does have documented allergy to some ingredients but states that triamcinolone  has worked for her in the past. Patient also complains of a feeling of dehydration due to exposure.  Labs have been ordered and are pending.  Patient care being signed out to [employer], PA-C.  Disposition pending lab results and reassessment.      Final diagnoses:  Homeless  Insect bite, unspecified site, initial encounter    ED Discharge Orders     None          Logan Ubaldo KATHEE DEVONNA 08/21/23 9364    Lorette Mayo, MD 08/22/23  [phone]

## 2023-08-21 NOTE — ED Triage Notes (Signed)
 Pt to er, [employer]/crisis response, states that they picked pt up, states that they were working on finding her housing, but then she started c/o chest pain and shortness of breath.  Pt placed on 2L O2 secondary to low O2 sat.

## 2023-08-22 NOTE — ED Notes (Signed)
 Attempted to assess patient. Patient not talking.

## 2023-08-22 NOTE — ED Provider Notes (Signed)
 [place] EMERGENCY DEPARTMENT AT [facility] Provider Note   CSN: [id number] Arrival date & time: 08/21/23  2337     Patient presents with: No Complaints   Angela [name] is a 73 y.o. female.  Patient presents to the emergency department with a chief complaint of feeling exhausted.  She is homeless.  This is her fifth visit to this emergency department within the past 2 days.  She has been evaluated for dehydration, insect bites, chest pain, shortness of breath, hypertension.  She is unable to endorse any other specific complaint to me at this time.  Outpatient resources have been working to help get the patient into housing without success as of this time.   HPI     Prior to Admission medications   Medication Sig Start Date End Date Taking? Authorizing Provider  acetaminophen  (TYLENOL ) 650 MG CR tablet Take 650 mg by mouth every 8 (eight) hours as needed for pain.    [provider]  Acetylcysteine (NAC) 500 MG CAPS Take 500 mg by mouth 2 (two) times a day.    [provider]  albuterol  (VENTOLIN  HFA) 108 (90 Base) MCG/ACT inhaler albuterol  sulfate HFA 90 mcg/actuation aerosol inhaler  INHALE 2 PUFFS BY MOUTH EVERY 4 HOURS AS NEEDED 07/03/23   White, Adrienne R, NP  allopurinol  (ZYLOPRIM ) 100 MG tablet Take 1 tablet (100 mg total) by mouth daily. 04/25/23   Cyrena Mylar, MD  allopurinol  (ZYLOPRIM ) 100 MG tablet Take 1 tablet (100 mg total) by mouth daily. 07/03/23 10/01/23  Teresa Shelba SAUNDERS, NP  amLODipine  (NORVASC ) 5 MG tablet Take 1 tablet (5 mg total) by mouth daily. 05/07/23 05/06/24  Malvina Alm DASEN, MD  ascorbic acid  (VITAMIN C ) 1000 MG tablet Take 2,000 mg by mouth daily.    [provider]  benzonatate  (TESSALON  PERLES) 100 MG capsule Take 1 capsule (100 mg total) by mouth 3 (three) times daily as needed for cough. 06/13/23   Gordan Huxley, MD  calcium  carbonate (OSCAL) 1500 (600 Ca) MG TABS tablet Calcium  600 mg calcium  (1,500 mg) tablet  Take  1 tablet 3 times a day by oral route.    [provider]  Cyanocobalamin  (VITAMIN B-12) 1000 MCG SUBL Place 1,000 mcg under the tongue.    [provider]  diclofenac  Sodium (VOLTAREN ) 1 % GEL Apply 4 g topically 4 (four) times daily. 01/31/23   McDonald, Juliene SAUNDERS, DPM  dicyclomine  (BENTYL ) 20 MG tablet Take 1 tablet (20 mg total) by mouth 3 (three) times daily before meals. 07/08/23 08/08/23  Lenon Marien CROME, MD  EPINEPHrine  0.3 mg/0.3 mL IJ SOAJ injection Inject into the skin. 03/01/22   [provider]  estradiol  (ESTRACE ) 0.1 MG/GM vaginal cream Place 1 Applicatorful vaginally daily.    [provider]  famotidine  (PEPCID ) 20 MG tablet Take 1 tablet (20 mg total) by mouth 2 (two) times daily as needed for heartburn or indigestion. 11/26/18   Marylynn Verneita CROME, MD  levalbuterol  (XOPENEX ) 1.25 MG/3ML nebulizer solution Take 3 mLs by nebulization every 6 (six) hours as needed for wheezing.    [provider]  levothyroxine  (SYNTHROID ) 112 MCG tablet Take 1 tablet (112 mcg total) by mouth daily. 08/20/23 09/19/23  Nada Chroman, DO  Lidocaine  4 % PTCH     [provider]  LORazepam  (ATIVAN ) 1 MG tablet Take 1 mg by mouth every 4 (four) hours as needed for sleep. 02/06/18   [provider]  Magnesium  500 MG TABS Take  500 mg by mouth every other day.    [provider]  mupirocin  ointment (BACTROBAN ) 2 % Apply 1 application  topically 3 (three) times daily. Patient not taking: Reported on 07/04/2023 02/04/19   [provider]  Nutritional Supplements (ENSURE PLANT-BASED PROTEIN) LIQD Take 1 Units by mouth as needed. 06/14/23   Cyrena Mylar, MD  ondansetron  (ZOFRAN -ODT) 4 MG disintegrating tablet Take 1 tablet (4 mg total) by mouth every 8 (eight) hours as needed for nausea or vomiting. 04/25/23   Cyrena Mylar, MD  pantoprazole  (PROTONIX ) 40 MG tablet Take 1 tablet (40 mg total) by mouth daily. 06/14/23 06/13/24  Cyrena Mylar, MD   PARoxetine  (PAXIL ) 20 MG tablet Take 1 tablet (20 mg total) by mouth daily. 07/08/23 09/07/23  Lenon Marien CROME, MD  prednisoLONE acetate (PRED FORTE) 1 % ophthalmic suspension  07/26/18   [provider]  pyridoxine  (B-6) 100 MG tablet Take 1 tablet (100 mg total) by mouth 2 (two) times daily. Patient taking differently: Take 500 mg by mouth in the morning and at bedtime. 12/26/17   Clapacs, Norleen DASEN, MD  Senna-Natural Laxatives (SENOKOT LAXATIVE) Tea Bag MISC     [provider]  sucralfate  (CARAFATE ) 1 GM/10ML suspension Take 10 mLs (1 g total) by mouth 4 (four) times daily. 04/25/23 04/24/24  Cyrena Mylar, MD  terconazole  (TERAZOL 7 ) 0.4 % vaginal cream     [provider]  Tiotropium Bromide  Monohydrate 2.5 MCG/ACT AERS Inhale into the lungs. 04/22/22 02/19/24  [provider]  traZODone  (DESYREL ) 50 MG tablet Take 0.5 tablets (25 mg total) by mouth at bedtime for 5 days. 07/13/23 07/18/23  Sponseller, Pleasant R, PA-C  triamcinolone  cream (KENALOG ) 0.1 % Apply 1 Application topically 2 (two) times daily. 08/21/23   Bauer, Collin S, PA-C  valsartan  (DIOVAN ) 320 MG tablet Take 1 tablet (320 mg total) by mouth daily. 07/08/23 09/07/23  Lenon Marien CROME, MD  vitamin C  (ASCORBIC ACID ) 250 MG tablet Take 1 tablet (250 mg total) by mouth daily. 05/07/23   Malvina Alm DASEN, MD  Vitamin D , Ergocalciferol , (DRISDOL ) 1.25 MG (50000 UNIT) CAPS capsule Take 1 capsule (50,000 Units total) by mouth every 7 (seven) days. Patient taking differently: Take 5,000 Units by mouth every 7 (seven) days. 05/19/19   Justus Leita DEL, MD  XIIDRA 5 % SOLN Place 1 drop into both eyes 2 (two) times daily. 07/27/18   [provider]    Allergies: Amoxicillin -pot clavulanate, Aspirin, Bee venom, Cephalexin , Ciprofloxacin , Fluticasone , Haloperidol , Molds & smuts, Neomycin-polymyxin-dexameth, Other, Peanuts [peanut oil], Penicillins, Propoxyphene, Sulfa antibiotics, Sulfacetamide sodium, Tramadol,  Amikacin, Bee pollen, Imipramine, Oxycodone, Prednisone, Colchicine , Hymenoptera venom preparations, Lactose intolerance (gi), Peanut-containing drug products, Phthalylsulfacetamide, Pine, Pregabalin, Risperidone, Sulfacetamide, Wound dressing adhesive, Adhesive [tape], Losartan , and Vistaril  [hydroxyzine  pamoate]    Review of Systems  Updated Vital Signs BP (!) 156/85 (BP Location: Right Arm)   Pulse 65   Temp 97.6 F (36.4 C) (Oral)   Resp 14   SpO2 94%   Physical Exam Vitals and nursing note reviewed.  Constitutional:      General: She is awake. She is not in acute distress.    Appearance: She is not ill-appearing.  HENT:     Head: Normocephalic and atraumatic.  Neck:     Vascular: No JVD.     Trachea: Trachea normal.  Cardiovascular:     Rate and Rhythm: Normal rate and regular rhythm.     Pulses:  Radial pulses are 2+ on the right side and 2+ on the left side.     Heart sounds: Normal heart sounds. No murmur heard. Pulmonary:     Effort: Pulmonary effort is normal. No tachypnea.     Breath sounds: Normal breath sounds. No wheezing.  Musculoskeletal:     Right lower leg: No edema.     Left lower leg: No edema.  Neurological:     Mental Status: She is alert and oriented to person, place, and time.  Psychiatric:        Behavior: Behavior is cooperative.     (all labs ordered are listed, but only abnormal results are displayed) Labs Reviewed - No data to display  EKG: None  Radiology: DG Chest 2 View Result Date: 08/20/2023 EXAM: 2 VIEW(S) XRAY OF THE CHEST 08/20/2023 06:54:00 AM COMPARISON: PA and lateral radiographs of the chest dated 07/11/2023. CLINICAL HISTORY: Chest pain. Reason for exam: chest pain. Patient complains of chest pain, says it started last night. Patient complains of shortness of breath and nausea, denies vomiting. FINDINGS: LUNGS AND PLEURA: No focal pulmonary opacity. No pulmonary edema. No pleural effusion. No pneumothorax. HEART AND  MEDIASTINUM: No acute abnormality of the cardiac and mediastinal silhouettes. Moderate hiatus hernia which is filled with air. BONES AND SOFT TISSUES: No acute osseous abnormality. IMPRESSION: 1. No acute findings. 2. Moderate hiatus hernia filled with air. Electronically signed by: evalene coho 08/20/2023 07:16 AM EDT RP Workstation: [id number]     Procedures   Medications Ordered in the ED - No data to display                                  Medical Decision Making  Patient is a 73 year old complaining of no complaints other than generalized exhaustion related to her homelessness.  She has a well-documented history of multiple emergency department visits related to social issues.  I saw her myself approximately 24 hours ago when the patient initially told me she was here for insect bites but then endorsed feeling dehydrated.  Labs were unremarkable at that time.  She also had a chest pain workup today before which was grossly unremarkable.  Resources been provided for local shelters.  I do not see any indication for emergent workup or admission at this time.  This visit appears to be for secondary gain. I see no evidence of a medical emergency.  Patient is stable for discharge.     Final diagnoses:  Homelessness    ED Discharge Orders     None          Logan Ubaldo KATHEE DEVONNA 08/22/23 9568    Trine Raynell Moder, MD 08/22/23 [phone]

## 2023-08-22 NOTE — Discharge Instructions (Signed)
Return to the emergency department if you develop any life-threatening symptoms.

## 2023-08-25 ENCOUNTER — Emergency Department (HOSPITAL_COMMUNITY)
Admission: EM | Admit: 2023-08-25 | Discharge: 2023-08-25 | Disposition: A | Discharge status: Home / Self Care | Attending: Emergency Medicine | Admitting: Emergency Medicine

## 2023-08-25 ENCOUNTER — Encounter (HOSPITAL_COMMUNITY): Payer: Self-pay | Admitting: Emergency Medicine

## 2023-08-25 ENCOUNTER — Other Ambulatory Visit: Payer: Self-pay

## 2023-08-25 DIAGNOSIS — Z91010 Allergy to peanuts: Secondary | ICD-10-CM | POA: Insufficient documentation

## 2023-08-25 DIAGNOSIS — M1990 Unspecified osteoarthritis, unspecified site: Secondary | ICD-10-CM | POA: Diagnosis not present

## 2023-08-25 DIAGNOSIS — M79671 Pain in right foot: Secondary | ICD-10-CM | POA: Diagnosis present

## 2023-08-25 MED ORDER — ACETAMINOPHEN 500 MG PO TABS
ORAL_TABLET | ORAL | Status: AC
  Filled 2023-08-25: qty 1

## 2023-08-25 MED ORDER — ACETAMINOPHEN 500 MG PO TABS
1000.0000 mg | ORAL_TABLET | Freq: Once | ORAL | Status: AC
  Administered 2023-08-25: 1000 mg via ORAL

## 2023-08-25 MED ORDER — ACETAMINOPHEN ER 650 MG PO TBCR: 650.0000 mg | EXTENDED_RELEASE_TABLET | Freq: Three times a day (TID) | ORAL | 0 refills | Status: DC | PRN

## 2023-08-25 NOTE — ED Provider Notes (Signed)
 [place] EMERGENCY DEPARTMENT AT [facility] Provider Note   CSN: [id number] Arrival date & time: 08/25/23  8594     Patient presents with: Foot Pain   Angela [name] is a 73 y.o. female.   The history is provided by the patient and medical records. No language interpreter was used.  Foot Pain     73 year old female with history of personality disorder, malingering, labile mood, brought here via EMS from the [facility] with complaint of foot pain.  Patient endorsed ongoing pain to hand and feet for many years.  Described as achy and throbbing sensation worse when she has to walk for prolonged period of time.  No complaint of any recent injury no fever no rash.  Patient states she lives in a shelter.  She also request to have her fingernails and toenails trimmed.  Prior to Admission medications   Medication Sig Start Date End Date Taking? Authorizing Provider  acetaminophen  (TYLENOL ) 650 MG CR tablet Take 650 mg by mouth every 8 (eight) hours as needed for pain.    [provider]  Acetylcysteine (NAC) 500 MG CAPS Take 500 mg by mouth 2 (two) times a day.    [provider]  albuterol  (VENTOLIN  HFA) 108 (90 Base) MCG/ACT inhaler albuterol  sulfate HFA 90 mcg/actuation aerosol inhaler  INHALE 2 PUFFS BY MOUTH EVERY 4 HOURS AS NEEDED 07/03/23   White, Adrienne R, NP  allopurinol  (ZYLOPRIM ) 100 MG tablet Take 1 tablet (100 mg total) by mouth daily. 04/25/23   Cyrena Mylar, MD  allopurinol  (ZYLOPRIM ) 100 MG tablet Take 1 tablet (100 mg total) by mouth daily. 07/03/23 10/01/23  Teresa Shelba SAUNDERS, NP  amLODipine  (NORVASC ) 5 MG tablet Take 1 tablet (5 mg total) by mouth daily. 05/07/23 05/06/24  Malvina Alm DASEN, MD  ascorbic acid  (VITAMIN C ) 1000 MG tablet Take 2,000 mg by mouth daily.    [provider]  benzonatate  (TESSALON  PERLES) 100 MG capsule Take 1 capsule (100 mg total) by mouth 3 (three) times daily as needed for cough. 06/13/23   Gordan Huxley,  MD  calcium  carbonate (OSCAL) 1500 (600 Ca) MG TABS tablet Calcium  600 mg calcium  (1,500 mg) tablet  Take 1 tablet 3 times a day by oral route.    [provider]  Cyanocobalamin  (VITAMIN B-12) 1000 MCG SUBL Place 1,000 mcg under the tongue.    [provider]  diclofenac  Sodium (VOLTAREN ) 1 % GEL Apply 4 g topically 4 (four) times daily. 01/31/23   McDonald, Juliene SAUNDERS, DPM  dicyclomine  (BENTYL ) 20 MG tablet Take 1 tablet (20 mg total) by mouth 3 (three) times daily before meals. 07/08/23 08/08/23  Lenon Marien CROME, MD  EPINEPHrine  0.3 mg/0.3 mL IJ SOAJ injection Inject into the skin. 03/01/22   [provider]  estradiol  (ESTRACE ) 0.1 MG/GM vaginal cream Place 1 Applicatorful vaginally daily.    [provider]  famotidine  (PEPCID ) 20 MG tablet Take 1 tablet (20 mg total) by mouth 2 (two) times daily as needed for heartburn or indigestion. 11/26/18   Marylynn Verneita CROME, MD  levalbuterol  (XOPENEX ) 1.25 MG/3ML nebulizer solution Take 3 mLs by nebulization every 6 (six) hours as needed for wheezing.    [provider]  levothyroxine  (SYNTHROID ) 112 MCG tablet Take 1 tablet (112 mcg total) by mouth daily. 08/20/23 09/19/23  Nada Chroman, DO  Lidocaine  4 % PTCH     [provider]  LORazepam  (ATIVAN ) 1 MG tablet Take 1 mg by mouth every  4 (four) hours as needed for sleep. 02/06/18   [provider]  Magnesium  500 MG TABS Take 500 mg by mouth every other day.    [provider]  mupirocin  ointment (BACTROBAN ) 2 % Apply 1 application  topically 3 (three) times daily. Patient not taking: Reported on 07/04/2023 02/04/19   [provider]  Nutritional Supplements (ENSURE PLANT-BASED PROTEIN) LIQD Take 1 Units by mouth as needed. 06/14/23   Cyrena Mylar, MD  ondansetron  (ZOFRAN -ODT) 4 MG disintegrating tablet Take 1 tablet (4 mg total) by mouth every 8 (eight) hours as needed for nausea or vomiting. 04/25/23   Cyrena Mylar, MD  pantoprazole   (PROTONIX ) 40 MG tablet Take 1 tablet (40 mg total) by mouth daily. 06/14/23 06/13/24  Cyrena Mylar, MD  PARoxetine  (PAXIL ) 20 MG tablet Take 1 tablet (20 mg total) by mouth daily. 07/08/23 09/07/23  Lenon Marien CROME, MD  prednisoLONE acetate (PRED FORTE) 1 % ophthalmic suspension  07/26/18   [provider]  pyridoxine  (B-6) 100 MG tablet Take 1 tablet (100 mg total) by mouth 2 (two) times daily. Patient taking differently: Take 500 mg by mouth in the morning and at bedtime. 12/26/17   Clapacs, Norleen DASEN, MD  Senna-Natural Laxatives (SENOKOT LAXATIVE) Tea Bag MISC     [provider]  sucralfate  (CARAFATE ) 1 GM/10ML suspension Take 10 mLs (1 g total) by mouth 4 (four) times daily. 04/25/23 04/24/24  Cyrena Mylar, MD  terconazole  (TERAZOL 7 ) 0.4 % vaginal cream     [provider]  Tiotropium Bromide  Monohydrate 2.5 MCG/ACT AERS Inhale into the lungs. 04/22/22 02/19/24  [provider]  traZODone  (DESYREL ) 50 MG tablet Take 0.5 tablets (25 mg total) by mouth at bedtime for 5 days. 07/13/23 07/18/23  Sponseller, Pleasant R, PA-C  triamcinolone  cream (KENALOG ) 0.1 % Apply 1 Application topically 2 (two) times daily. 08/21/23   Bauer, Collin S, PA-C  valsartan  (DIOVAN ) 320 MG tablet Take 1 tablet (320 mg total) by mouth daily. 07/08/23 09/07/23  Lenon Marien CROME, MD  vitamin C  (ASCORBIC ACID ) 250 MG tablet Take 1 tablet (250 mg total) by mouth daily. 05/07/23   Malvina Alm DASEN, MD  Vitamin D , Ergocalciferol , (DRISDOL ) 1.25 MG (50000 UNIT) CAPS capsule Take 1 capsule (50,000 Units total) by mouth every 7 (seven) days. Patient taking differently: Take 5,000 Units by mouth every 7 (seven) days. 05/19/19   Justus Leita DEL, MD  XIIDRA 5 % SOLN Place 1 drop into both eyes 2 (two) times daily. 07/27/18   [provider]    Allergies: Amoxicillin -pot clavulanate, Aspirin, Bee venom, Cephalexin , Ciprofloxacin , Fluticasone , Haloperidol , Molds & smuts, Neomycin-polymyxin-dexameth, Other,  Peanuts [peanut oil], Penicillins, Propoxyphene, Sulfa antibiotics, Sulfacetamide sodium, Tramadol, Amikacin, Bee pollen, Imipramine, Oxycodone, Prednisone, Colchicine , Hymenoptera venom preparations, Lactose intolerance (gi), Peanut-containing drug products, Phthalylsulfacetamide, Pine, Pregabalin, Risperidone, Sulfacetamide, Wound dressing adhesive, Adhesive [tape], Losartan , and Vistaril  [hydroxyzine  pamoate]    Review of Systems  Constitutional:  Negative for fatigue.    Updated Vital Signs BP (!) 147/77 (BP Location: Right Arm)   Pulse 61   Temp 98 F (36.7 C) (Oral)   Resp 13   SpO2 100%   Physical Exam Vitals and nursing note reviewed.  Constitutional:      General: She is not in acute distress.    Appearance: She is well-developed.  HENT:     Head: Atraumatic.  Eyes:     Conjunctiva/sclera: Conjunctivae normal.  Pulmonary:     Effort: Pulmonary effort is normal.  Musculoskeletal:        General: Tenderness (Examination of bilateral hands and feet without any signs of infection.  It is mildly tender to gentle palpation but no erythema edema or warmth noted.  Radial pulse 2+ bilaterally, intact DP pulses) present.     Cervical back: Neck supple.  Skin:    Findings: No rash.  Neurological:     Mental Status: She is alert.  Psychiatric:        Mood and Affect: Mood normal.     (all labs ordered are listed, but only abnormal results are displayed) Labs Reviewed - No data to display  EKG: None  Radiology: No results found.   Procedures   Medications Ordered in the ED  acetaminophen  (TYLENOL ) tablet 1,000 mg (1,000 mg Oral Given 08/25/23 1436)                                    Medical Decision Making  BP (!) 147/77 (BP Location: Right Arm)   Pulse 61   Temp 98 F (36.7 C) (Oral)   Resp 13   SpO2 100%   16:66 PM  73 year old female with history of personality disorder, malingering, labile mood, brought here via EMS from the [facility] with complaint  of foot pain.  Patient endorsed ongoing pain to hand and feet for many years.  Described as achy and throbbing sensation worse when she has to walk for prolonged period of time.  No complaint of any recent injury no fever no rash.  Patient states she lives in a shelter.  She also request to have her fingernails and toenails trimmed.  On exam elderly female resting comfortably appears to be in no acute discomfort.  Examination of her hands and feet without any signs of infection and no deformity noted.  Her symptom is suggestive of arthritis.  Low suspicion for cellulitis and septic joint fracture or dislocation.  Tylenol  given for pain.  Will give patient referral to podiatrist for toenails to get trimmed.     Final diagnoses:  Arthritis    ED Discharge Orders          Ordered    acetaminophen  (TYLENOL ) 650 MG CR tablet  Every 8 hours PRN        08/25/23 1434               Nivia Colon, PA-C 08/25/23 1439    Freddi Hamilton, MD 08/25/23 [phone]

## 2023-08-25 NOTE — Discharge Instructions (Addendum)
 Pain c/o hands and feet are likely due to arthritis.  You may perform gentle massage, take Tylenol  as needed for pain, and you may follow-up with podiatrist to have your toenails trimmed

## 2023-08-25 NOTE — ED Triage Notes (Signed)
 BIB EMS from [facility].  Foot pain she reports d/t walking a lot.   VSS

## 2023-08-29 ENCOUNTER — Emergency Department (HOSPITAL_COMMUNITY)
Admission: EM | Admit: 2023-08-29 | Discharge: 2023-08-30 | Disposition: A | Discharge status: Home / Self Care | Attending: Emergency Medicine | Admitting: Emergency Medicine

## 2023-08-29 ENCOUNTER — Emergency Department (HOSPITAL_COMMUNITY)

## 2023-08-29 DIAGNOSIS — R1013 Epigastric pain: Secondary | ICD-10-CM | POA: Diagnosis not present

## 2023-08-29 DIAGNOSIS — E039 Hypothyroidism, unspecified: Secondary | ICD-10-CM | POA: Diagnosis not present

## 2023-08-29 DIAGNOSIS — R462 Strange and inexplicable behavior: Secondary | ICD-10-CM | POA: Insufficient documentation

## 2023-08-29 DIAGNOSIS — Z5900 Homelessness unspecified: Secondary | ICD-10-CM | POA: Diagnosis not present

## 2023-08-29 DIAGNOSIS — R079 Chest pain, unspecified: Secondary | ICD-10-CM | POA: Diagnosis present

## 2023-08-29 DIAGNOSIS — J449 Chronic obstructive pulmonary disease, unspecified: Secondary | ICD-10-CM | POA: Insufficient documentation

## 2023-08-29 DIAGNOSIS — D649 Anemia, unspecified: Secondary | ICD-10-CM | POA: Diagnosis not present

## 2023-08-29 DIAGNOSIS — R0789 Other chest pain: Secondary | ICD-10-CM | POA: Insufficient documentation

## 2023-08-29 DIAGNOSIS — Z85828 Personal history of other malignant neoplasm of skin: Secondary | ICD-10-CM | POA: Diagnosis not present

## 2023-08-29 DIAGNOSIS — Z91010 Allergy to peanuts: Secondary | ICD-10-CM | POA: Diagnosis not present

## 2023-08-29 DIAGNOSIS — R030 Elevated blood-pressure reading, without diagnosis of hypertension: Secondary | ICD-10-CM | POA: Diagnosis not present

## 2023-08-29 MED ORDER — ACETAMINOPHEN 500 MG PO TABS
1000.0000 mg | ORAL_TABLET | Freq: Once | ORAL | Status: AC
  Administered 2023-08-30: 1000 mg via ORAL
  Filled 2023-08-29: qty 2

## 2023-08-29 MED ORDER — ALUM & MAG HYDROXIDE-SIMETH 200-200-20 MG/5ML PO SUSP
30.0000 mL | Freq: Once | ORAL | Status: AC
  Administered 2023-08-30: 30 mL via ORAL
  Filled 2023-08-29: qty 30

## 2023-08-29 NOTE — ED Provider Notes (Signed)
 Emergency Department Provider Note   I have reviewed the triage vital signs and the nursing notes.   HISTORY  Chief Complaint Abdominal Pain   HPI Angela [name] is a 73 y.o. female with past history reviewed below including unstable housing situation and multiple ED visits presents to the emergency department with acute onset chest and abdomen pain.  She was brought in by EMS to recall to the grocery store.  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  She apparently had sudden left chest pain into the abdomen.  No diaphoresis, nausea, vomiting.  She did feel the urge to have diarrhea but did not.  No fevers.  EMS arrived but she would not allow any treatment in the field.  Past Medical History:  Diagnosis Date   Agitation 09/18/2017   Allergic rhinitis    Altered mental status, unspecified 12/25/2018   Anemia    Blurred vision    Chest pain of uncertain etiology 05/31/2018   Confusion state 12/25/2018   COPD (chronic obstructive pulmonary disease) (HCC)    Depression    Diverticulosis    Endometriosis    Falls    GERD (gastroesophageal reflux disease)    Hernia, hiatal    Hypothyroidism    IBS (irritable bowel syndrome)    Labile mood    Malignant neoplasm of skin    Malnutrition of mild degree (HCC) 08/08/2018   Migraine    Neuropathy    Noncompliance 12/25/2017   PTSD (post-traumatic stress disorder)    SIADH (syndrome of inappropriate ADH production) (HCC) 09/08/2016   Thyroid  disease     Review of Systems  Constitutional: No fever/chills Cardiovascular: Positive chest pain. Respiratory: Denies shortness of breath. Gastrointestinal: Positive abdominal pain.  No nausea, no vomiting.  Skin: Negative for rash. Neurological: Negative for headaches.  ____________________________________________   PHYSICAL EXAM:  VITAL SIGNS: Vitals:   08/29/23 2322 08/29/23 2327  BP: (!) 171/85   Pulse: 77   Resp: 13   Temp: 97.6 F (36.4 C) 97.6 F  (36.4 C)  SpO2: 100%     Constitutional: Alert and oriented. Well appearing and in no acute distress. Eyes: Conjunctivae are normal.  Head: Atraumatic. Nose: No congestion/rhinnorhea. Mouth/Throat: Mucous membranes are moist.   Neck: No stridor.   Cardiovascular: Normal rate, regular rhythm. Good peripheral circulation. Grossly normal heart sounds.   Respiratory: Normal respiratory effort.  No retractions. Lungs CTAB. Gastrointestinal: Soft and nontender in all quadrants. No distention.  Musculoskeletal: No lower extremity tenderness nor edema. No gross deformities of extremities. Neurologic:  Normal speech and language. No gross focal neurologic deficits are appreciated.  Skin:  Skin is warm, dry and intact. No rash noted.  ____________________________________________   LABS (all labs ordered are listed, but only abnormal results are displayed)  Labs Reviewed  COMPREHENSIVE METABOLIC PANEL WITH GFR - Abnormal; Notable for the following components:      Result Value   Total Protein 6.2 (*)    Albumin 3.4 (*)    All other components within normal limits  CBC WITH DIFFERENTIAL/PLATELET - Abnormal; Notable for the following components:   RBC 3.84 (*)    Hemoglobin 11.4 (*)    HCT 35.1 (*)    All other components within normal limits  URINALYSIS, W/ REFLEX TO CULTURE (INFECTION SUSPECTED) - Abnormal; Notable for the following components:   Color, Urine COLORLESS (*)    All other components within normal limits  LIPASE, BLOOD  ETHANOL  RAPID URINE DRUG SCREEN,  HOSP PERFORMED  TROPONIN I (HIGH SENSITIVITY)  TROPONIN I (HIGH SENSITIVITY)   ____________________________________________  EKG   EKG Interpretation Date/Time:  Wednesday August 29 2023 23:25:36 EDT Ventricular Rate:  65 PR Interval:  160 QRS Duration:  102 QT Interval:  418 QTC Calculation: 435 R Axis:   65  Text Interpretation: Sinus rhythm Probable left atrial enlargement RSR' in V1 or V2, right VCD or RVH  Nonspecific T abnrm, anterolateral leads Similar to prior Confirmed by Darra Chew [phone]) on 08/29/2023 11:28:13 PM        ____________________________________________  RADIOLOGY  DG Chest Portable 1 View Result Date: 08/30/2023 CLINICAL DATA:  Chest pain EXAM: PORTABLE CHEST 1 VIEW COMPARISON:  08/20/2023 FINDINGS: Cardiac shadow is enlarged. Large hiatal hernia is again identified and stable. Tortuous thoracic aorta is noted. Lungs are clear bilaterally. No bony abnormality is noted. IMPRESSION: Large hiatal hernia stable from the prior exam. No acute abnormality noted. Electronically Signed   By: Oneil Devonshire M.D.   On: 08/30/2023 00:03    ____________________________________________   PROCEDURES  Procedure(s) performed:   Procedures  None  ____________________________________________   INITIAL IMPRESSION / ASSESSMENT AND PLAN / ED COURSE  Pertinent labs & imaging results that were available during my care of the patient were reviewed by me and considered in my medical decision making (see chart for details).   This patient is Presenting for Evaluation of CP, which does require a range of treatment options, and is a complaint that involves a high risk of morbidity and mortality.  The Differential Diagnoses includes but is not exclusive to acute coronary syndrome, aortic dissection, pulmonary embolism, cardiac tamponade, community-acquired pneumonia, pericarditis, musculoskeletal chest wall pain, etc.   Critical Interventions-    Medications  alum & mag hydroxide-simeth (MAALOX/MYLANTA) 200-200-20 MG/5ML suspension 30 mL (30 mLs Oral Given 08/30/23 0032)  acetaminophen  (TYLENOL ) tablet 1,000 mg (1,000 mg Oral Given 08/30/23 0034)    Reassessment after intervention: symptoms improved.    I did obtain Additional Historical Information from EMS.   I decided to review pertinent External Data, and in summary patient with 19 ED visits in the last 6 months at this  facility.   Clinical Laboratory Tests Ordered, included no acute kidney injury.  Potassium normal.  Mild anemia to 11.4.  Troponin normal.  No UTI.  Radiologic Tests Ordered, included CXR. I independently interpreted the images and agree with radiology interpretation.   Cardiac Monitor Tracing which shows NSR.    Social Determinants of Health Risk patient is a non-smoker.   Medical Decision Making: Summary:  Patient presents emergency department for evaluation of chest pain/abdominal pain.  She presents frequently to the emergency department with various complaints.  EKG shows no acute ischemic change.  Abdomen is diffusely soft and nontender.  Plan for screening blood work including troponin and will place IV. Patient in agreement.   Reevaluation with update and discussion with patient.  Her abdominal pain has resolved.  Do not plan for abdominal imaging.  ACS workup reassuring.  Stable for discharge.  Considered admission but ED workup is reassuring.   Patient's presentation is most consistent with acute presentation with potential threat to life or bodily function.   Disposition: discharge  ____________________________________________  FINAL CLINICAL IMPRESSION(S) / ED DIAGNOSES  Final diagnoses:  Epigastric pain  Atypical chest pain    Note:  This document was prepared using Dragon voice recognition software and may include unintentional dictation errors.  Chew Darra, MD, [facility] Emergency Medicine  Darra Fonda MATSU, MD 08/30/23 [phone]

## 2023-08-29 NOTE — ED Triage Notes (Signed)
 Pt bibg[facility]ms from [employer]. Pt slumped over electric cart. Upon ems arrival pt stated she has left upper abd pain. Non compliant with EMS instructing them to not touch her. Refused IV with ems .   VSS with EMS

## 2023-08-30 ENCOUNTER — Emergency Department (HOSPITAL_COMMUNITY)
Admission: EM | Admit: 2023-08-30 | Discharge: 2023-08-30 | Disposition: A | Discharge status: Home / Self Care | Attending: Emergency Medicine | Admitting: Emergency Medicine

## 2023-08-30 ENCOUNTER — Other Ambulatory Visit: Payer: Self-pay

## 2023-08-30 ENCOUNTER — Encounter (HOSPITAL_COMMUNITY): Payer: Self-pay

## 2023-08-30 DIAGNOSIS — Z91010 Allergy to peanuts: Secondary | ICD-10-CM | POA: Insufficient documentation

## 2023-08-30 DIAGNOSIS — Z5900 Homelessness unspecified: Secondary | ICD-10-CM | POA: Insufficient documentation

## 2023-08-30 DIAGNOSIS — R030 Elevated blood-pressure reading, without diagnosis of hypertension: Secondary | ICD-10-CM | POA: Insufficient documentation

## 2023-08-30 DIAGNOSIS — J449 Chronic obstructive pulmonary disease, unspecified: Secondary | ICD-10-CM | POA: Insufficient documentation

## 2023-08-30 DIAGNOSIS — R0789 Other chest pain: Secondary | ICD-10-CM | POA: Diagnosis not present

## 2023-08-30 LAB — CBC WITH DIFFERENTIAL/PLATELET
Abs Immature Granulocytes: 0.02 K/uL (ref 0.00–0.07)
Basophils Absolute: 0 K/uL (ref 0.0–0.1)
Basophils Relative: 1 %
Eosinophils Absolute: 0.2 K/uL (ref 0.0–0.5)
Eosinophils Relative: 3 %
HCT: 35.1 % — ABNORMAL LOW (ref 36.0–46.0)
Hemoglobin: 11.4 g/dL — ABNORMAL LOW (ref 12.0–15.0)
Immature Granulocytes: 0 %
Lymphocytes Relative: 19 %
Lymphs Abs: 1.1 K/uL (ref 0.7–4.0)
MCH: 29.7 pg (ref 26.0–34.0)
MCHC: 32.5 g/dL (ref 30.0–36.0)
MCV: 91.4 fL (ref 80.0–100.0)
Monocytes Absolute: 0.5 K/uL (ref 0.1–1.0)
Monocytes Relative: 9 %
Neutro Abs: 3.9 K/uL (ref 1.7–7.7)
Neutrophils Relative %: 68 %
Platelets: 276 K/uL (ref 150–400)
RBC: 3.84 MIL/uL — ABNORMAL LOW (ref 3.87–5.11)
RDW: 14.5 % (ref 11.5–15.5)
WBC: 5.7 K/uL (ref 4.0–10.5)
nRBC: 0 % (ref 0.0–0.2)

## 2023-08-30 LAB — RAPID URINE DRUG SCREEN, HOSP PERFORMED
Amphetamines: NOT DETECTED
Barbiturates: NOT DETECTED
Benzodiazepines: NOT DETECTED
Cocaine: NOT DETECTED
Opiates: NOT DETECTED
Tetrahydrocannabinol: NOT DETECTED

## 2023-08-30 LAB — COMPREHENSIVE METABOLIC PANEL WITH GFR
ALT: 17 U/L (ref 0–44)
AST: 23 U/L (ref 15–41)
Albumin: 3.4 g/dL — ABNORMAL LOW (ref 3.5–5.0)
Alkaline Phosphatase: 75 U/L (ref 38–126)
Anion gap: 10 (ref 5–15)
BUN: 18 mg/dL (ref 8–23)
CO2: 23 mmol/L (ref 22–32)
Calcium: 9.4 mg/dL (ref 8.9–10.3)
Chloride: 105 mmol/L (ref 98–111)
Creatinine, Ser: 0.68 mg/dL (ref 0.44–1.00)
GFR, Estimated: >60 mL/min (ref >60)
Glucose, Bld: 98 mg/dL (ref 70–99)
Potassium: 4.1 mmol/L (ref 3.5–5.1)
Sodium: 138 mmol/L (ref 135–145)
Total Bilirubin: 0.2 mg/dL (ref 0.0–1.2)
Total Protein: 6.2 g/dL — ABNORMAL LOW (ref 6.5–8.1)

## 2023-08-30 LAB — URINALYSIS, W/ REFLEX TO CULTURE (INFECTION SUSPECTED)
Bacteria, UA: NONE SEEN
Bilirubin Urine: NEGATIVE
Glucose, UA: NEGATIVE mg/dL
Hgb urine dipstick: NEGATIVE
Ketones, ur: NEGATIVE mg/dL
Leukocytes,Ua: NEGATIVE
Nitrite: NEGATIVE
Protein, ur: NEGATIVE mg/dL
Specific Gravity, Urine: 1.005 (ref 1.005–1.030)
pH: 7 (ref 5.0–8.0)

## 2023-08-30 LAB — TROPONIN I (HIGH SENSITIVITY): Troponin I (High Sensitivity): 3 ng/L (ref <18)

## 2023-08-30 LAB — ETHANOL: Alcohol, Ethyl (B): <15 mg/dL (ref <15)

## 2023-08-30 LAB — LIPASE, BLOOD: Lipase: 42 U/L (ref 11–51)

## 2023-08-30 NOTE — ED Notes (Signed)
 Pt currently refusing blood work. RN attempted to get troponin but pt refused.

## 2023-08-30 NOTE — ED Notes (Signed)
 Attempted to get 2nd trop pt was very irritable said she doesn't know why we are checking her heart when we don't think there is anything wrong with her heart. I stuck her went to move the needle and she said If you are going to fish then you are done, she moved her arm and screamed stop you are hurting me, you are done and will not be sticking me anymore, the needle was stable and had not moved. I informed her that I will be documenting that she is refusing her 2nd trop, she stated No I did not and I have the mark to prove it, I informed her that not allowing me to obtain the blood is her refusing she told that I would not continue to hurt her and she was sick of all this. At this point she sat up and tried to push things off of her and scratched my arm in the process. She was becoming irate and I left the room. I've stuck this pt several times in previous visits with no problems. She has told me many times before that she is homeless and will continue to keep coming in until we find her secure housing placement.

## 2023-08-30 NOTE — Discharge Instructions (Signed)

## 2023-08-30 NOTE — ED Triage Notes (Signed)
 Pt recently discharged from ED, checked back in before leaving lobby. Pt c/o with chest pain 7/10 on pain scale. Zero noted distress at present. Pt is requesting that her cardiologist come to see her in the ED.

## 2023-08-30 NOTE — ED Provider Notes (Signed)
 [place] EMERGENCY DEPARTMENT AT [facility] Provider Note   CSN: [id number] Arrival date & time: 08/30/23  0535     Patient presents with: Chest Pain   Angela [name] is a 73 y.o. female.   Patient with history of COPD, unstable housing presents today with complaints of chest pain.  Patient was seen here for this overnight last night and was discharged after a negative workup, reportedly never left the lobby and immediately checked back in.  For me, the patient repeatedly states I need help and you are not helping me.  She has refused repeat laboratory evaluation, states you jabbing me only hurts me and does not help me.  When I ask how we can help her, she asked if I can help her set up her MyChart online.  Her blood pressure was elevated, during my visit with her she took her blood pressure medications, states she missed her morning dose.  Denies any specific complaints at this time.   The history is provided by the patient. No language interpreter was used.  Chest Pain      Prior to Admission medications   Medication Sig Start Date End Date Taking? Authorizing Provider  acetaminophen  (TYLENOL ) 650 MG CR tablet Take 1 tablet (650 mg total) by mouth every 8 (eight) hours as needed for pain. 08/25/23   Nivia Colon, PA-C  Acetylcysteine (NAC) 500 MG CAPS Take 500 mg by mouth 2 (two) times a day.    [provider]  albuterol  (VENTOLIN  HFA) 108 (90 Base) MCG/ACT inhaler albuterol  sulfate HFA 90 mcg/actuation aerosol inhaler  INHALE 2 PUFFS BY MOUTH EVERY 4 HOURS AS NEEDED 07/03/23   White, Adrienne R, NP  allopurinol  (ZYLOPRIM ) 100 MG tablet Take 1 tablet (100 mg total) by mouth daily. 04/25/23   Cyrena Mylar, MD  allopurinol  (ZYLOPRIM ) 100 MG tablet Take 1 tablet (100 mg total) by mouth daily. 07/03/23 10/01/23  Teresa Shelba SAUNDERS, NP  amLODipine  (NORVASC ) 5 MG tablet Take 1 tablet (5 mg total) by mouth daily. 05/07/23 05/06/24  Malvina Alm DASEN, MD  ascorbic acid   (VITAMIN C ) 1000 MG tablet Take 2,000 mg by mouth daily.    [provider]  benzonatate  (TESSALON  PERLES) 100 MG capsule Take 1 capsule (100 mg total) by mouth 3 (three) times daily as needed for cough. 06/13/23   Gordan Huxley, MD  calcium  carbonate (OSCAL) 1500 (600 Ca) MG TABS tablet Calcium  600 mg calcium  (1,500 mg) tablet  Take 1 tablet 3 times a day by oral route.    [provider]  Cyanocobalamin  (VITAMIN B-12) 1000 MCG SUBL Place 1,000 mcg under the tongue.    [provider]  diclofenac  Sodium (VOLTAREN ) 1 % GEL Apply 4 g topically 4 (four) times daily. 01/31/23   McDonald, Juliene SAUNDERS, DPM  dicyclomine  (BENTYL ) 20 MG tablet Take 1 tablet (20 mg total) by mouth 3 (three) times daily before meals. 07/08/23 08/08/23  Lenon Marien CROME, MD  EPINEPHrine  0.3 mg/0.3 mL IJ SOAJ injection Inject into the skin. 03/01/22   [provider]  estradiol  (ESTRACE ) 0.1 MG/GM vaginal cream Place 1 Applicatorful vaginally daily.    [provider]  famotidine  (PEPCID ) 20 MG tablet Take 1 tablet (20 mg total) by mouth 2 (two) times daily as needed for heartburn or indigestion. 11/26/18   Marylynn Verneita CROME, MD  levalbuterol  (XOPENEX ) 1.25 MG/3ML nebulizer solution Take 3 mLs by nebulization every 6 (six) hours as needed for wheezing.    [provider]  levothyroxine  (SYNTHROID ) 112 MCG tablet Take 1 tablet (112 mcg total) by mouth daily. 08/20/23 09/19/23  Nada Chroman, DO  Lidocaine  4 % PTCH     [provider]  LORazepam  (ATIVAN ) 1 MG tablet Take 1 mg by mouth every 4 (four) hours as needed for sleep. 02/06/18   [provider]  Magnesium  500 MG TABS Take 500 mg by mouth every other day.    [provider]  mupirocin  ointment (BACTROBAN ) 2 % Apply 1 application  topically 3 (three) times daily. Patient not taking: Reported on 07/04/2023 02/04/19   [provider]  Nutritional Supplements (ENSURE PLANT-BASED PROTEIN) LIQD Take 1  Units by mouth as needed. 06/14/23   Cyrena Mylar, MD  ondansetron  (ZOFRAN -ODT) 4 MG disintegrating tablet Take 1 tablet (4 mg total) by mouth every 8 (eight) hours as needed for nausea or vomiting. 04/25/23   Cyrena Mylar, MD  pantoprazole  (PROTONIX ) 40 MG tablet Take 1 tablet (40 mg total) by mouth daily. 06/14/23 06/13/24  Cyrena Mylar, MD  PARoxetine  (PAXIL ) 20 MG tablet Take 1 tablet (20 mg total) by mouth daily. 07/08/23 09/07/23  Lenon Marien CROME, MD  prednisoLONE acetate (PRED FORTE) 1 % ophthalmic suspension  07/26/18   [provider]  pyridoxine  (B-6) 100 MG tablet Take 1 tablet (100 mg total) by mouth 2 (two) times daily. Patient taking differently: Take 500 mg by mouth in the morning and at bedtime. 12/26/17   Clapacs, Norleen DASEN, MD  Senna-Natural Laxatives (SENOKOT LAXATIVE) Tea Bag MISC     [provider]  sucralfate  (CARAFATE ) 1 GM/10ML suspension Take 10 mLs (1 g total) by mouth 4 (four) times daily. 04/25/23 04/24/24  Cyrena Mylar, MD  terconazole  (TERAZOL 7 ) 0.4 % vaginal cream     [provider]  Tiotropium Bromide  Monohydrate 2.5 MCG/ACT AERS Inhale into the lungs. 04/22/22 02/19/24  [provider]  traZODone  (DESYREL ) 50 MG tablet Take 0.5 tablets (25 mg total) by mouth at bedtime for 5 days. 07/13/23 07/18/23  Sponseller, Pleasant R, PA-C  triamcinolone  cream (KENALOG ) 0.1 % Apply 1 Application topically 2 (two) times daily. 08/21/23   Bauer, Collin S, PA-C  valsartan  (DIOVAN ) 320 MG tablet Take 1 tablet (320 mg total) by mouth daily. 07/08/23 09/07/23  Lenon Marien CROME, MD  vitamin C  (ASCORBIC ACID ) 250 MG tablet Take 1 tablet (250 mg total) by mouth daily. 05/07/23   Malvina Alm DASEN, MD  Vitamin D , Ergocalciferol , (DRISDOL ) 1.25 MG (50000 UNIT) CAPS capsule Take 1 capsule (50,000 Units total) by mouth every 7 (seven) days. Patient taking differently: Take 5,000 Units by mouth every 7 (seven) days. 05/19/19   Justus Leita DEL, MD  XIIDRA 5 % SOLN Place 1 drop into  both eyes 2 (two) times daily. 07/27/18   [provider]    Allergies: Amoxicillin -pot clavulanate, Aspirin, Bee venom, Cephalexin , Ciprofloxacin , Fluticasone , Haloperidol , Molds & smuts, Neomycin-polymyxin-dexameth, Other, Peanuts [peanut oil], Penicillins, Propoxyphene, Sulfa antibiotics, Sulfacetamide sodium, Tramadol, Amikacin, Bee pollen, Imipramine, Oxycodone, Prednisone, Colchicine , Hymenoptera venom preparations, Lactose intolerance (gi), Peanut-containing drug products, Phthalylsulfacetamide, Pine, Pregabalin, Risperidone, Sulfacetamide, Wound dressing adhesive, Adhesive [tape], Losartan , and Vistaril  [hydroxyzine  pamoate]    Review of Systems  Cardiovascular:  Positive for chest pain.  All other systems reviewed and are negative.   Updated Vital Signs BP (!) 198/109 (BP Location: Left Arm)   Pulse (!) 57   Temp 97.8 F (36.6 C)   Resp 18   Ht 5' 2 (1.575 m)  Wt 59.9 kg   SpO2 98%   BMI 24.14 kg/m   Physical Exam Vitals and nursing note reviewed.  Constitutional:      General: She is not in acute distress.    Appearance: Normal appearance. She is normal weight. She is not ill-appearing, toxic-appearing or diaphoretic.  HENT:     Head: Normocephalic and atraumatic.  Cardiovascular:     Rate and Rhythm: Normal rate and regular rhythm.     Heart sounds: Normal heart sounds.  Pulmonary:     Effort: Pulmonary effort is normal. No respiratory distress.     Breath sounds: Normal breath sounds.  Abdominal:     Palpations: Abdomen is soft.     Tenderness: There is no abdominal tenderness.  Musculoskeletal:        General: Normal range of motion.     Cervical back: Normal range of motion.  Skin:    General: Skin is warm and dry.  Neurological:     General: No focal deficit present.     Mental Status: She is alert.  Psychiatric:        Mood and Affect: Mood normal.        Behavior: Behavior normal.     (all labs ordered are listed, but only abnormal  results are displayed) Labs Reviewed  TROPONIN I (HIGH SENSITIVITY)  TROPONIN I (HIGH SENSITIVITY)    EKG: None  Radiology: DG Chest Portable 1 View Result Date: 08/30/2023 CLINICAL DATA:  Chest pain EXAM: PORTABLE CHEST 1 VIEW COMPARISON:  08/20/2023 FINDINGS: Cardiac shadow is enlarged. Large hiatal hernia is again identified and stable. Tortuous thoracic aorta is noted. Lungs are clear bilaterally. No bony abnormality is noted. IMPRESSION: Large hiatal hernia stable from the prior exam. No acute abnormality noted. Electronically Signed   By: Oneil Devonshire M.D.   On: 08/30/2023 00:03     Procedures   Medications Ordered in the ED - No data to display                                  Medical Decision Making  Patient returns today with complaints of chest pain.  She is afebrile, nontoxic-appearing, and in no acute distress reassuring vital signs.  Physical exam is benign.  EKG reviewed by me, shows sinus rhythm, no STEMI.  Her chest x-ray overnight was benign.  Her labs were also unremarkable, these were obtained less than 12 hours ago.  Given this, no indication to repeat lab work.  Patient did not even leave the lobby before she checked back in.  Patient with 20 ER visits in the last 6 months.  Her blood pressure is mildly elevated, reports she missed her morning medication.  She did take her morning meds during our conversation.  Patient refuses additional intervention.  Recommend that she call her cardiologist as well as her pcp to schedule a close follow-up appointment. Evaluation and diagnostic testing in the emergency department does not suggest an emergent condition requiring admission or immediate intervention beyond what has been performed at this time.  Plan for discharge with close PCP follow-up.  Patient is understanding and amenable with plan, educated on red flag symptoms that would prompt immediate return.  Patient discharged in stable condition.  Final diagnoses:   Homelessness  Elevated blood pressure reading    ED Discharge Orders     None     An After Visit Summary was printed and given  to the patient.      Angela [name] 08/30/23 1143    Angela Lamar BROCKS, MD 09/01/23 [phone]

## 2023-08-30 NOTE — Discharge Instructions (Addendum)
 Your blood pressure is high today, please be sure to take your home medication for this.  You can call your cardiologist to schedule follow-up appointment as well as your PCP.  Return if development of any new or worsening symptoms.

## 2023-08-30 NOTE — ED Notes (Signed)
 Pt refused blood work

## 2023-08-30 NOTE — ED Notes (Signed)
 CCMD called due to pts leads being off, this nurse in a critical patients room at the time. Per phlebotomy patient agitated and physical with phlebotomist during blood draw. Charge also nurse attempting to assist with placing back on monitor but patient refusing.

## 2023-09-03 NOTE — Progress Notes (Signed)
 CC: Chief Complaint  Patient presents with  . New Patient  . Nail Problem    Thick and discolored toenails     Angela [name] presents for several complaints.  Relates thick painful toenails and all of her toes causing pain and pressure in her shoes when walking.  Also relates pain with some arthritis and hammertoes on her second toes.  Recently has noticed a painful blood blister on her right great toe.  Does not recall any injury or drainage   Objective: All 10 toenails are thick, dystrophic, discolored, brittle with subungual debris.  Hallux nails are ingrown along the borders with no signs of any drainage.  Significant hallux valgus deformity noted on the left foot with hammertoe contractures noted on the second toes bilateral.  Some palpable exostosis at the DIPJ area on the left.  Intact dried blood blister noted on the medial distal aspect of the right hallux with no evidence of ulceration following debridement.  Assessment: Encounter Diagnoses  Name Primary?  . Hammer toes of both feet Yes  . Hallux valgus of left foot   . Ingrowing nail   . Mycotic toenails   . Pain in toes of both feet   . Blood blister     Plan: Debridement of all 10 toenails in length and thickness sharply using toenail nippers with curette used to smooth the ingrown borders of nail.  Pressure relief off of the digital areas with debridement of the blood blister as well.  Sterile bandage applied.  Discussed some accommodative gel padding products and spacers that could be used to help prevent pressure from her digital deformities.  Patient will return to clinic as needed.  No orders of the defined types were placed in this encounter.

## 2023-09-04 ENCOUNTER — Emergency Department
Admission: EM | Admit: 2023-09-04 | Discharge: 2023-09-06 | Disposition: A | Discharge status: Other Acute Inpatient Hospital | Attending: Emergency Medicine | Admitting: Emergency Medicine

## 2023-09-04 ENCOUNTER — Other Ambulatory Visit: Payer: Self-pay

## 2023-09-04 DIAGNOSIS — F4325 Adjustment disorder with mixed disturbance of emotions and conduct: Secondary | ICD-10-CM | POA: Diagnosis not present

## 2023-09-04 DIAGNOSIS — F609 Personality disorder, unspecified: Secondary | ICD-10-CM | POA: Diagnosis present

## 2023-09-04 DIAGNOSIS — Z79899 Other long term (current) drug therapy: Secondary | ICD-10-CM | POA: Diagnosis not present

## 2023-09-04 DIAGNOSIS — F32A Depression, unspecified: Secondary | ICD-10-CM | POA: Insufficient documentation

## 2023-09-04 DIAGNOSIS — I10 Essential (primary) hypertension: Secondary | ICD-10-CM | POA: Diagnosis not present

## 2023-09-04 DIAGNOSIS — F319 Bipolar disorder, unspecified: Secondary | ICD-10-CM | POA: Insufficient documentation

## 2023-09-04 MED ORDER — LEVOTHYROXINE SODIUM 112 MCG PO TABS
112.0000 ug | ORAL_TABLET | Freq: Every day | ORAL | Status: DC
  Administered 2023-09-06: 112 ug via ORAL
  Filled 2023-09-04 (×2): qty 1

## 2023-09-04 MED ORDER — AMLODIPINE BESYLATE 5 MG PO TABS
5.0000 mg | ORAL_TABLET | Freq: Every day | ORAL | Status: DC
  Administered 2023-09-04 – 2023-09-06 (×3): 5 mg via ORAL
  Filled 2023-09-04 (×3): qty 1

## 2023-09-04 NOTE — ED Notes (Addendum)
 Pt asked again about labs and EKG and pt refused by shaking her head and curling her body up in a ball making it impossible for staff to have access to her chest and arms.

## 2023-09-04 NOTE — ED Notes (Addendum)
 Pt refusing labs to be taken and EKG to be performed. Pt states staff is just wanting to hurt her. Pt reports she came for help and then got put in the psych ward and would like to be left alone.

## 2023-09-04 NOTE — ED Notes (Addendum)
 [facility] REPORT:  Pt was arrested and currently under custody of the [facility] jail for trespassing. Jail staff took pt to [facility] under IVC for paranoia and delusions. [facility] staff reported that pt was extremely argumentative and refused to sign documents as well as answer questions without arguing with staff. [facility] attempted to place pt in mental health facility today, including [facility] but was unable to find placement. [facility] sending pt to ED for further evaluation.

## 2023-09-04 NOTE — ED Notes (Signed)
 Pt refusing bloodwork at this time

## 2023-09-04 NOTE — ED Notes (Signed)
Dr. Jessup at the bedside for pt evaluation  

## 2023-09-04 NOTE — ED Notes (Signed)
 Pt taken to interview room in wheelchair by security and TTS for evaluation. Pt appears calm at this time.

## 2023-09-04 NOTE — ED Provider Notes (Signed)
 [facility] Provider Note    Event Date/Time   First MD Initiated Contact with Patient 09/04/23 1952     (approximate)   History   Chief Complaint Psychiatric Evaluation   HPI  Angela [name] is a 73 y.o. female with past medical history of hypertension, GERD, and anemia who presents to the ED for psychiatric evaluation.  Patient was reportedly recently in local jail and there was concern for increased depression and suicidal ideation, so she was referred to [facility] earlier today.  As part of evaluation at [facility], patient was placed under IVC and psychiatric admission was sought.  After patient was unable to be placed, she was sent to the ED for further evaluation and potential placement.  On my evaluation, patient reports depression but denies any thoughts of harming herself.  She states she has not been taking any medication for her blood pressure or any other medications.  She denies any complaints outside of depression.     Physical Exam   Triage Vital Signs: ED Triage Vitals  Encounter Vitals Group     BP 09/04/23 1934 (!) 178/110     Girls Systolic BP Percentile --      Girls Diastolic BP Percentile --      Boys Systolic BP Percentile --      Boys Diastolic BP Percentile --      Pulse Rate 09/04/23 1934 (!) 55     Resp 09/04/23 1934 17     Temp 09/04/23 1934 97.8 F (36.6 C)     Temp Source 09/04/23 1934 Oral     SpO2 09/04/23 1934 100 %     Weight 09/04/23 1939 132 lb 4.4 oz (60 kg)     Height 09/04/23 1939 5' 2 (1.575 m)     Head Circumference --      Peak Flow --      Pain Score 09/04/23 1939 0     Pain Loc --      Pain Education --      Exclude from Growth Chart --     Most recent vital signs: Vitals:   09/04/23 1934 09/04/23 1940  BP: (!) 178/110   Pulse: (!) 55   Resp: 17 16  Temp: 97.8 F (36.6 C)   SpO2: 100%     Constitutional: Alert and oriented. Eyes: Conjunctivae are normal. Head: Atraumatic. Nose: No  congestion/rhinnorhea. Mouth/Throat: Mucous membranes are moist.  Cardiovascular: Normal rate, regular rhythm. Grossly normal heart sounds.  2+ radial pulses bilaterally. Respiratory: Normal respiratory effort.  No retractions. Lungs CTAB. Gastrointestinal: Soft and nontender. No distention. Musculoskeletal: No lower extremity tenderness nor edema.  Neurologic:  Normal speech and language. No gross focal neurologic deficits are appreciated.    ED Results / Procedures / Treatments   Labs (all labs ordered are listed, but only abnormal results are displayed) Labs Reviewed  CBC WITH DIFFERENTIAL/PLATELET  COMPREHENSIVE METABOLIC PANEL WITH GFR  ETHANOL  URINALYSIS, ROUTINE W REFLEX MICROSCOPIC  URINE DRUG SCREEN, QUALITATIVE ([facility] ONLY)     PROCEDURES:  Critical Care performed: No  Procedures   MEDICATIONS ORDERED IN ED: Medications  amLODipine  (NORVASC ) tablet 5 mg (5 mg Oral Given 09/04/23 2058)  levothyroxine  (SYNTHROID ) tablet 112 mcg (112 mcg Oral Patient Refused/Not Given 09/04/23 2058)     IMPRESSION / MDM / ASSESSMENT AND PLAN / ED COURSE  I reviewed the triage vital signs and the nursing notes.  73 y.o. female with past medical history of hypertension, GERD, and anemia who presents to the ED for psychiatric evaluation due to concern for depression and suicidal ideation.  Patient's presentation is most consistent with acute presentation with potential threat to life or bodily function.  Differential diagnosis includes, but is not limited to, psychosis, depression, anxiety, suicidal ideation, homicidal ideation, uncontrolled hypertension, medication noncompliance, anemia, electrolyte abnormality, AKI.  Patient nontoxic-appearing and in no acute distress, vital signs remarkable for hypertension but otherwise reassuring.  We will screen EKG and labs but no symptoms to suggest hypertensive emergency at this time.  She was placed under IVC  prior to arrival at [facility], psychiatric evaluation is pending.  Patient refusing EKG and blood draw, was agreeable to taking amlodipine  but refused her usual levothyroxine .  She appears to have capacity to refuse lab draw at this time, no symptoms to suggest hypertensive emergency and will continue to monitor BP following amlodipine .  Psychiatric evaluation pending at this time.  The patient has been placed in psychiatric observation due to the need to provide a safe environment for the patient while obtaining psychiatric consultation and evaluation, as well as ongoing medical and medication management to treat the patient's condition.  The patient has been placed under full IVC at this time.        FINAL CLINICAL IMPRESSION(S) / ED DIAGNOSES   Final diagnoses:  Depression, unspecified depression type     Rx / DC Orders   ED Discharge Orders     None        Note:  This document was prepared using Dragon voice recognition software and may include unintentional dictation errors.   Willo Dunnings, MD 09/04/23 2129

## 2023-09-04 NOTE — BH Assessment (Signed)
 Comprehensive Clinical Assessment (CCA) Note  09/04/2023 Angela [name] [id number] Angela CROME. Devinney is a 73 year old, English speaking, Caucasian female. Pt presented to [facility] ED under IVC. Per triage note: Pt to ED via [facility] under IVC, per papers frequent encounters with [facility] and pt was under arrest at the [place] [facility] jail, pt reports thoughts of sadness and depression. Pt is homeless. Pt coming from [facility]. Pt is currently denying thoughts of SI  Chief Complaint: I was committed. I was told I'd be helped with my blood pressure. I'm in a lot of pain, I mean what do you want to know? Who are you? Mental Status Examination: Appearance: Disheveled; restless psychomotor activity noted. Speech: Clear and coherent. Mood: Labile. Affect: Congruent with mood; tearful during discussion of stressors. Thought Process: Vague and guarded; frequently refused to answer questions. Thought Content: Paranoid ideation present; denied suicidal ideation (SI), homicidal ideation (HI), auditory/visual hallucinations (AV/H). Insight/Judgment: Limited; patient repeatedly questioned provider identity and purpose of assessment. Cognition: Highly distractible; oriented to person but not fully cooperative with assessment. Psychosocial Stressors: Unhoused status Lack of social supports Food insecurity Physical pain (reported in feet) Recent arrest (patient refused to discuss details) Psychiatric History: Denies current psychiatric services Denies use of psychiatric medications States she does not have a psychiatric issue, but a social issue Behavioral Observations: Patient presented as visibly distressed and tearful. Guarded throughout the assessment, frequently questioning the provider and refusing to elaborate on her circumstances. Demonstrated signs of paranoia and emotional dysregulation. Assessment Summary: Patient presents with significant psychosocial stressors including homelessness, food insecurity, and  lack of support. Emotional distress and labile mood observed. Though patient denies psychiatric concerns, her presentation suggests need for further evaluation and support. Safety concerns are minimal at this time as patient denies SI/HI/AV/H. Chief Complaint:  Chief Complaint  Patient presents with   Psychiatric Evaluation   Visit Diagnosis:  Stress reaction causing mixed disturbance of emotion and conduct Active Problems:   Personality disorder (HCC)   CCA Screening, Triage and Referral (STR)  Patient Reported Information How did you hear about us ? No data recorded Referral name: No data recorded Referral phone number: No data recorded  Whom do you see for routine medical problems? No data recorded Practice/Facility Name: No data recorded Practice/Facility Phone Number: No data recorded Name of Contact: No data recorded Contact Number: No data recorded Contact Fax Number: No data recorded Prescriber Name: No data recorded Prescriber Address (if known): No data recorded  What Is the Reason for Your Visit/Call Today? No data recorded How Long Has This Been Causing You Problems? No data recorded What Do You Feel Would Help You the Most Today? No data recorded  Have You Recently Been in Any Inpatient Treatment (Hospital/Detox/Crisis Center/28-Day Program)? No data recorded Name/Location of Program/Hospital:No data recorded How Long Were You There? No data recorded When Were You Discharged? No data recorded  Have You Ever Received Services From [facility] Before? No data recorded Who Do You See at [facility]? No data recorded  Have You Recently Had Any Thoughts About Hurting Yourself? No data recorded Are You Planning to Commit Suicide/Harm Yourself At This time? No data recorded  Have you Recently Had Thoughts About Hurting Someone [place]? No data recorded Explanation: No data recorded  Have You Used Any Alcohol  or Drugs in the Past 24 Hours? No data recorded How Long Ago  Did You Use Drugs or Alcohol ? No data recorded What Did You Use and How Much? No data recorded  Do  You Currently Have a Therapist/Psychiatrist? No data recorded Name of Therapist/Psychiatrist: No data recorded  Have You Been Recently Discharged From Any Office Practice or Programs? No data recorded Explanation of Discharge From Practice/Program: No data recorded    CCA Screening Triage Referral Assessment Type of Contact: No data recorded Is this Initial or Reassessment? No data recorded Date Telepsych consult ordered in [facility]:  No data recorded Time Telepsych consult ordered in [facility]:  No data recorded  Patient Reported Information Reviewed? No data recorded Patient Left Without Being Seen? No data recorded Reason for Not Completing Assessment: No data recorded  Collateral Involvement: No data recorded  Does Patient Have a Court Appointed Legal Guardian? No data recorded Name and Contact of Legal Guardian: No data recorded If Minor and Not Living with Parent(s), Who has Custody? No data recorded Is CPS involved or ever been involved? No data recorded Is APS involved or ever been involved? No data recorded  Patient Determined To Be At Risk for Harm To Self or Others Based on Review of Patient Reported Information or Presenting Complaint? No data recorded Method: No data recorded Availability of Means: No data recorded Intent: No data recorded Notification Required: No data recorded Additional Information for Danger to Others Potential: No data recorded Additional Comments for Danger to Others Potential: No data recorded Are There Guns or Other Weapons in Your Home? No data recorded Types of Guns/Weapons: No data recorded Are These Weapons Safely Secured?                            No data recorded Who Could Verify You Are Able To Have These Secured: No data recorded Do You Have any Outstanding Charges, Pending Court Dates, Parole/Probation? No data recorded Contacted To Inform of  Risk of Harm To Self or Others: No data recorded  Location of Assessment: No data recorded  Does Patient Present under Involuntary Commitment? No data recorded IVC Papers Initial File Date: No data recorded  [place] of Residence: No data recorded  Patient Currently Receiving the Following Services: No data recorded  Determination of Need: No data recorded  Options For Referral: No data recorded    CCA Biopsychosocial Intake/Chief Complaint:  No data recorded Current Symptoms/Problems: No data recorded  Patient Reported Schizophrenia/Schizoaffective Diagnosis in Past: No data recorded  Strengths: No data recorded Preferences: No data recorded Abilities: No data recorded  Type of Services Patient Feels are Needed: No data recorded  Initial Clinical Notes/Concerns: No data recorded  Mental Health Symptoms Depression:  No data recorded  Duration of Depressive symptoms: No data recorded  Mania:  No data recorded  Anxiety:   No data recorded  Psychosis:  No data recorded  Duration of Psychotic symptoms: No data recorded  Trauma:  No data recorded  Obsessions:  No data recorded  Compulsions:  No data recorded  Inattention:  No data recorded  Hyperactivity/Impulsivity:  No data recorded  Oppositional/Defiant Behaviors:  No data recorded  Emotional Irregularity:  No data recorded  Other Mood/Personality Symptoms:  No data recorded   Mental Status Exam Appearance and self-care  Stature:  No data recorded  Weight:  No data recorded  Clothing:  No data recorded  Grooming:  No data recorded  Cosmetic use:  No data recorded  Posture/gait:  No data recorded  Motor activity:  No data recorded  Sensorium  Attention:  No data recorded  Concentration:  No data recorded  Orientation:  No data recorded  Recall/memory:  No data recorded  Affect and Mood  Affect:  No data recorded  Mood:  No data recorded  Relating  Eye contact:  No data recorded  Facial expression:  No data  recorded  Attitude toward examiner:  No data recorded  Thought and Language  Speech flow: No data recorded  Thought content:  No data recorded  Preoccupation:  No data recorded  Hallucinations:  No data recorded  Organization:  No data recorded  [employer] of Knowledge:  No data recorded  Intelligence:  No data recorded  Abstraction:  No data recorded  Judgement:  No data recorded  Reality Testing:  No data recorded  Insight:  No data recorded  Decision Making:  No data recorded  Social Functioning  Social Maturity:  No data recorded  Social Judgement:  No data recorded  Stress  Stressors:  No data recorded  Coping Ability:  No data recorded  Skill Deficits:  No data recorded  Supports:  No data recorded    Religion:    Leisure/Recreation:    Exercise/Diet:     CCA Employment/Education Employment/Work Situation:    Education:     CCA Family/Childhood History Family and Relationship History:    Childhood History:     Child/Adolescent Assessment:     CCA Substance Use Alcohol /Drug Use:                           ASAM's:  Six Dimensions of Multidimensional Assessment  Dimension 1:  Acute Intoxication and/or Withdrawal Potential:      Dimension 2:  Biomedical Conditions and Complications:      Dimension 3:  Emotional, Behavioral, or Cognitive Conditions and Complications:     Dimension 4:  Readiness to Change:     Dimension 5:  Relapse, Continued use, or Continued Problem Potential:     Dimension 6:  Recovery/Living Environment:     ASAM Severity Score:    ASAM Recommended Level of Treatment:     Substance use Disorder (SUD)    Recommendations for Services/Supports/Treatments:    DSM5 Diagnoses: Patient Active Problem List   Diagnosis Date Noted   Diverticulitis 07/04/2023   Stress reaction causing mixed disturbance of emotion and conduct 09/10/2021   Homeless    Mixed hyperlipidemia 07/02/2019   Hypothyroidism  due to acquired atrophy of thyroid  04/29/2019   Osteopenia determined by x-ray 04/29/2019   Acquired claw toe of both feet 10/23/2018   Aortic atherosclerosis (HCC) 05/31/2018   Achilles tendon disorder 04/25/2018   Diastolic dysfunction 03/29/2018   Mitral regurgitation 03/29/2018   Personality disorder (HCC) 12/25/2017   Arthritis of hand, degenerative 12/07/2017   Hallux valgus, acquired 07/05/2017   Hammer toes of both feet 07/05/2017   Unstable ankle 07/05/2017   Tinea unguium 05/08/2017   Conductive hearing loss of right ear with unrestricted hearing of left ear 04/03/2017   Adjustment disorder with depressed mood 01/18/2017   Migraine 01/18/2017   Irritable bowel syndrome with constipation 11/17/2016   Bilateral carpal tunnel syndrome 11/17/2016   Plantar fasciitis, bilateral 11/17/2016   Cervical myofascial pain syndrome 11/17/2016   Chronic hyponatremia 11/17/2016   PTSD (post-traumatic stress disorder) 11/17/2016   Hiatal hernia 11/17/2016   Vitamin D  deficiency 11/17/2016   Vitamin B12 deficiency 11/17/2016   Cervical spondylosis with myelopathy 05/24/2016   MCI (mild cognitive impairment) with memory loss 03/30/2016   Gait abnormality 03/30/2016   Chronic bipolar  affective disorder (HCC) 01/24/2016   Anemia, iron deficiency 06/24/2015   Benign essential HTN 06/24/2015   History of falling 05/22/2015   History of TIA (transient ischemic attack) 04/20/2015   Gastroesophageal reflux disease with hiatal hernia 04/20/2015   Cervical disc disorder at C5-C6 level with radiculopathy 03/25/2015   Chronic neck pain 03/25/2015   Pelvic pain in female 10/30/2014   History of skin cancer 06/30/2014   Chronic headaches 12/16/2013   Neuropathy 12/16/2013    Patient Centered Plan: Patient is on the following Treatment Plan(s):  Inpatient treatment  Seabron Iannello R Sharnese Heath, LCAS

## 2023-09-04 NOTE — ED Notes (Signed)
 Pt returned from interview room escorted by security and TTS. Evaluation complete.

## 2023-09-04 NOTE — ED Notes (Addendum)
 Patient belongings: Clear bag with miscellaneous items and bags, ziploc bag of meds and a comb, 2 cell phones, beige striped shirt, green pants,white underwear, pink shirt, black shoes, orange socks, blue fleece jacket.

## 2023-09-04 NOTE — ED Triage Notes (Addendum)
 Pt to ED via [employer] under IVC, per papers frequent encounters with [facility] and pt was under arrest at the [place] [facility] jail, pt reports thoughts of sadness and depression. Pt is homeless. Pt coming from [facility]. Pt is currently denying thoughts of SI

## 2023-09-05 DIAGNOSIS — F609 Personality disorder, unspecified: Secondary | ICD-10-CM | POA: Diagnosis not present

## 2023-09-05 LAB — CBC WITH DIFFERENTIAL/PLATELET
Abs Immature Granulocytes: 0.01 K/uL (ref 0.00–0.07)
Basophils Absolute: 0.1 K/uL (ref 0.0–0.1)
Basophils Relative: 1 %
Eosinophils Absolute: 0.2 K/uL (ref 0.0–0.5)
Eosinophils Relative: 5 %
HCT: 36.9 % (ref 36.0–46.0)
Hemoglobin: 12 g/dL (ref 12.0–15.0)
Immature Granulocytes: 0 %
Lymphocytes Relative: 27 %
Lymphs Abs: 1.4 K/uL (ref 0.7–4.0)
MCH: 29.3 pg (ref 26.0–34.0)
MCHC: 32.5 g/dL (ref 30.0–36.0)
MCV: 90.2 fL (ref 80.0–100.0)
Monocytes Absolute: 0.5 K/uL (ref 0.1–1.0)
Monocytes Relative: 9 %
Neutro Abs: 2.9 K/uL (ref 1.7–7.7)
Neutrophils Relative %: 58 %
Platelets: 298 K/uL (ref 150–400)
RBC: 4.09 MIL/uL (ref 3.87–5.11)
RDW: 14.1 % (ref 11.5–15.5)
WBC: 5 K/uL (ref 4.0–10.5)
nRBC: 0 % (ref 0.0–0.2)

## 2023-09-05 LAB — COMPREHENSIVE METABOLIC PANEL WITH GFR
ALT: 16 U/L (ref 0–44)
AST: 23 U/L (ref 15–41)
Albumin: 3.6 g/dL (ref 3.5–5.0)
Alkaline Phosphatase: 80 U/L (ref 38–126)
Anion gap: 11 (ref 5–15)
BUN: 14 mg/dL (ref 8–23)
CO2: 23 mmol/L (ref 22–32)
Calcium: 8.8 mg/dL — ABNORMAL LOW (ref 8.9–10.3)
Chloride: 105 mmol/L (ref 98–111)
Creatinine, Ser: 0.69 mg/dL (ref 0.44–1.00)
GFR, Estimated: >60 mL/min (ref >60)
Glucose, Bld: 94 mg/dL (ref 70–99)
Potassium: 3.1 mmol/L — ABNORMAL LOW (ref 3.5–5.1)
Sodium: 139 mmol/L (ref 135–145)
Total Bilirubin: 0.2 mg/dL (ref 0.0–1.2)
Total Protein: 6.7 g/dL (ref 6.5–8.1)

## 2023-09-05 LAB — ETHANOL: Alcohol, Ethyl (B): <15 mg/dL (ref <15)

## 2023-09-05 MED ORDER — OLANZAPINE 10 MG PO TBDP
5.0000 mg | ORAL_TABLET | Freq: Every day | ORAL | Status: DC
  Administered 2023-09-05: 5 mg via ORAL
  Filled 2023-09-05 (×2): qty 1

## 2023-09-05 MED ORDER — OLANZAPINE 10 MG IM SOLR
10.0000 mg | Freq: Once | INTRAMUSCULAR | Status: AC
  Administered 2023-09-05: 10 mg via INTRAMUSCULAR
  Filled 2023-09-05: qty 10

## 2023-09-05 NOTE — ED Notes (Signed)
 Pt provided snack by tech

## 2023-09-05 NOTE — ED Notes (Signed)
 IVC/psych inpt

## 2023-09-05 NOTE — Consult Note (Signed)
 [facility] Psychiatric Consult Initial  Patient Name: .Angela [name]  MRN: [id number]  DOB: July 07, 1950  Consult Order details:  Orders (From admission, onward)     Start     Ordered   09/04/23 2036  IP CONSULT TO PSYCHIATRY       Ordering Provider: Willo Dunnings, MD  Provider:  (Not yet assigned)  Question:  Reason for consult:  Answer:  Medication management   09/04/23 2035   09/04/23 2036  CONSULT TO CALL ACT TEAM       Ordering Provider: Willo Dunnings, MD  Provider:  (Not yet assigned)  Question:  Reason for Consult?  Answer:  IVC   09/04/23 2035             Mode of Visit: Tele-visit Virtual Statement:TELE PSYCHIATRY ATTESTATION & CONSENT As the provider for this telehealth consult, I attest that I verified the patient's identity using two separate identifiers, introduced myself to the patient, provided my credentials, disclosed my location, and performed this encounter via a HIPAA-compliant, real-time, face-to-face, two-way, interactive audio and video platform and with the full consent and agreement of the patient (or guardian as applicable.) Patient physical location: [facility]. Telehealth provider physical location: home office in state of  .   Video start time:   Video end time:      Psychiatry Consult Evaluation  Service Date: September 05, 2023 LOS:  LOS: 0 days  Chief Complaint Psychiatric Evaluation  Primary Psychiatric Diagnoses  Depression unspecified 2.  Bipolar  Assessment  Angela [name] is a 73 y.o. female admitted: Presented to the EDfor 09/04/2023  7:50 PM for psychiatric evaluation after referral from [facility], where she was placed under IVC due to concern for worsening depression and suicidal ideation following recent incarceration. She carries the psychiatric diagnoses of bipolar depression and has a past medical history of hypertension, GERD, and anemia.  Her current presentation of tearfulness, loneliness,  mistrust of others, and self-reported depression without SI/HI/AVH is most consistent with unspecified depressive disorder in the context of psychosocial stressors (recent jail stay, isolation, and lack of social supports). She meets criteria for major depressive disorder, provisional based on reported low mood, tearfulness, hopelessness/loneliness, and functional decline, though she currently denies suicidal or homicidal ideation.  Current outpatient psychotropic medications include none reported; historically she has had a poor or unclear response given lack of consistent psychiatric treatment. She was not compliant with medical medications prior to admission as evidenced by her report of not taking medications.  On initial examination, patient was alert and oriented, tearful, and reluctant to engage. Affect was constricted, mood depressed, thought process linear but guarded, thought content notable for loneliness and mistrust, with denial of SI/HI/AVH. Insight and judgment limited. She was reluctant to share details due to lack of trust.  Diagnoses:  Active Hospital problems: Active Problems:   * No active hospital problems. *    Plan   ## Psychiatric Medication Recommendations:  Zyprexa  5 mg at bedtime   ## Medical Decision Making Capacity: Not specifically addressed in this encounter   ## Disposition:-- We recommend inpatient psychiatric hospitalization after medical hospitalization. Patient has been involuntarily committed on 09/04/23.   ## Behavioral / Environmental: - No specific recommendations at this time.     ## Safety and Observation Level:  - Based on my clinical evaluation, I estimate the patient to be at low risk of self harm in the current setting. - At this time, we recommend  routine. This decision  is based on my review of the chart including patient's history and current presentation, interview of the patient, mental status examination, and consideration of suicide  risk including evaluating suicidal ideation, plan, intent, suicidal or self-harm behaviors, risk factors, and protective factors. This judgment is based on our ability to directly address suicide risk, implement suicide prevention strategies, and develop a safety plan while the patient is in the clinical setting. Please contact our team if there is a concern that risk level has changed.  CSSR Risk Category:C-SSRS RISK CATEGORY: No Risk  Suicide Risk Assessment: Patient has following modifiable risk factors for suicide: untreated depression and medication noncompliance, which we are addressing by inpatient admission. Patient has following non-modifiable or demographic risk factors for suicide: psychiatric hospitalization Patient has the following protective factors against suicide: Frustration tolerance  Thank you for this consult request. Recommendations have been communicated to the primary team.  We will recommend inpatient admission at this time.   Izik Bingman, NP       History of Present Illness  Relevant Aspects of Hospital ED Course:  Admitted on 09/04/2023 for Psychiatric evaluation.   Patient Report:  Angela [name] is a 73 year old female with a past medical history of hypertension, GERD, and anemia who presented to the ED for psychiatric evaluation. The patient was reportedly recently incarcerated at the local jail, where there was concern for worsening depression and suicidal ideation. She was referred to [facility] earlier today and, after evaluation, was placed under IVC for psychiatric admission.  On evaluation in the ED, the patient reports feeling depressed but denies current suicidal or homicidal ideation or auditory/visual hallucinations. She admits she has not been taking her blood pressure medication or other prescribed medications. She does not report any acute medical complaints outside of feeling depressed.  During the interview, the patient became tearful,  expressing feelings of loneliness and a strong desire for connection. She was also reluctant to share personal information, stating that she does not trust anyone and does not feel comfortable talking about herself.  Psych ROS:  Depression: yes Anxiety:  yes Mania (lifetime and current): no Psychosis: (lifetime and current): no  Review of Systems  Constitutional: Negative.   HENT: Negative.    Eyes: Negative.   Respiratory: Negative.    Cardiovascular: Negative.   Gastrointestinal:  Positive for abdominal pain.  Genitourinary: Negative.   Musculoskeletal: Negative.   Skin: Negative.   Neurological: Negative.   Psychiatric/Behavioral:  The patient is nervous/anxious.      Psychiatric and Social History  Psychiatric History:  Information collected from Patient  Prev Dx/Sx: Bipolar Current Psych Provider: unknown Home Meds (current): denies Previous Med Trials: unknown Therapy: denies  Prior Psych Hospitalization: yes  Prior Self Harm: yes Prior Violence: no  Family Psych History: No pertinent family psych history Family Hx suicide: No known family hx suicide.  Social History:  Developmental Hx: unknown Educational Hx: unknown Occupational Hx: unknown Legal Hx: unknown Living Situation: group home Spiritual Hx: unknown Access to weapons/lethal means: no   Substance History Alcohol : denies  Tobacco: denies Illicit drugs: denies Prescription drug abuse: denies Rehab hx: unknown  Exam Findings   Vital Signs:  Temp:  [97.8 F (36.6 C)] 97.8 F (36.6 C) (08/19 1934) Pulse Rate:  [55] 55 (08/19 1934) Resp:  [16-17] 16 (08/19 1940) BP: (178)/(110) 178/110 (08/19 1934) SpO2:  [100 %] 100 % (08/19 1934) Weight:  [60 kg] 60 kg (08/19 1939) Blood pressure (!) 178/110, pulse (!) 55, temperature 97.8  F (36.6 C), temperature source Oral, resp. rate 16, height 5' 2 (1.575 m), weight 60 kg, SpO2 100%. Body mass index is 24.19 kg/m.  Physical Exam HENT:     Head:  Normocephalic.     Nose: Nose normal.     Mouth/Throat:     Pharynx: Oropharynx is clear.  Eyes:     Extraocular Movements: Extraocular movements intact.  Pulmonary:     Effort: Pulmonary effort is normal.  Musculoskeletal:        General: Normal range of motion.     Cervical back: Normal range of motion.  Skin:    General: Skin is dry.  Neurological:     Mental Status: She is alert.        Other History   These have been pulled in through the EMR, reviewed, and updated if appropriate.  Family History:  The patient's family history includes Anxiety disorder in her brother and brother; Arthritis in her brother, brother, and sister; COPD in her father; Cancer in her mother; Depression in her father and sister; Hearing loss in her father; Heart disease in her father; Hernia in her brother and brother; Stroke in her father; Varicose Veins in her father; Vision loss in her father.  Medical History: Past Medical History:  Diagnosis Date   Agitation 09/18/2017   Allergic rhinitis    Altered mental status, unspecified 12/25/2018   Anemia    Blurred vision    Chest pain of uncertain etiology 05/31/2018   Confusion state 12/25/2018   COPD (chronic obstructive pulmonary disease) (HCC)    Depression    Diverticulosis    Endometriosis    Falls    GERD (gastroesophageal reflux disease)    Hernia, hiatal    Hypothyroidism    IBS (irritable bowel syndrome)    Labile mood    Malignant neoplasm of skin    Malnutrition of mild degree (HCC) 08/08/2018   Migraine    Neuropathy    Noncompliance 12/25/2017   PTSD (post-traumatic stress disorder)    SIADH (syndrome of inappropriate ADH production) (HCC) 09/08/2016   Thyroid  disease     Surgical History: Past Surgical History:  Procedure Laterality Date   APPENDECTOMY     CHOLECYSTECTOMY     CHOLECYSTECTOMY, LAPAROSCOPIC     ESOPHAGOGASTRODUODENOSCOPY (EGD) WITH PROPOFOL  N/A 03/29/2015   Procedure: ESOPHAGOGASTRODUODENOSCOPY (EGD) WITH  PROPOFOL ;  Surgeon: Donnice Vaughn Manes, MD;  Location: [facility] ENDOSCOPY;  Service: Endoscopy;  Laterality: N/A;   HERNIA REPAIR     laproscopy     TONSILLECTOMY     uteral suspension       Medications:   Current Facility-Administered Medications:    amLODipine  (NORVASC ) tablet 5 mg, 5 mg, Oral, Daily, Jessup, Charles, MD, 5 mg at 09/04/23 2058   levothyroxine  (SYNTHROID ) tablet 112 mcg, 112 mcg, Oral, Q0600, Jessup, Charles, MD  Current Outpatient Medications:    acetaminophen  (TYLENOL ) 650 MG CR tablet, Take 1 tablet (650 mg total) by mouth every 8 (eight) hours as needed for pain., Disp: 30 tablet, Rfl: 0   Acetylcysteine (NAC) 500 MG CAPS, Take 500 mg by mouth 2 (two) times a day., Disp: , Rfl:    albuterol  (VENTOLIN  HFA) 108 (90 Base) MCG/ACT inhaler, albuterol  sulfate HFA 90 mcg/actuation aerosol inhaler  INHALE 2 PUFFS BY MOUTH EVERY 4 HOURS AS NEEDED, Disp: 8 g, Rfl: 0   allopurinol  (ZYLOPRIM ) 100 MG tablet, Take 1 tablet (100 mg total) by mouth daily., Disp: 90 tablet, Rfl: 1   allopurinol  (  ZYLOPRIM ) 100 MG tablet, Take 1 tablet (100 mg total) by mouth daily., Disp: 30 tablet, Rfl: 2   amLODipine  (NORVASC ) 5 MG tablet, Take 1 tablet (5 mg total) by mouth daily., Disp: 30 tablet, Rfl: 2   ascorbic acid  (VITAMIN C ) 1000 MG tablet, Take 2,000 mg by mouth daily., Disp: , Rfl:    benzonatate  (TESSALON  PERLES) 100 MG capsule, Take 1 capsule (100 mg total) by mouth 3 (three) times daily as needed for cough., Disp: 30 capsule, Rfl: 0   calcium  carbonate (OSCAL) 1500 (600 Ca) MG TABS tablet, Calcium  600 mg calcium  (1,500 mg) tablet  Take 1 tablet 3 times a day by oral route., Disp: , Rfl:    Cyanocobalamin  (VITAMIN B-12) 1000 MCG SUBL, Place 1,000 mcg under the tongue., Disp: , Rfl:    diclofenac  Sodium (VOLTAREN ) 1 % GEL, Apply 4 g topically 4 (four) times daily., Disp: 100 g, Rfl: 4   dicyclomine  (BENTYL ) 20 MG tablet, Take 1 tablet (20 mg total) by mouth 3 (three) times daily before  meals., Disp: 90 tablet, Rfl: 0   EPINEPHrine  0.3 mg/0.3 mL IJ SOAJ injection, Inject into the skin., Disp: , Rfl:    estradiol  (ESTRACE ) 0.1 MG/GM vaginal cream, Place 1 Applicatorful vaginally daily., Disp: , Rfl:    famotidine  (PEPCID ) 20 MG tablet, Take 1 tablet (20 mg total) by mouth 2 (two) times daily as needed for heartburn or indigestion., Disp: 180 tablet, Rfl: 0   levalbuterol  (XOPENEX ) 1.25 MG/3ML nebulizer solution, Take 3 mLs by nebulization every 6 (six) hours as needed for wheezing., Disp: , Rfl:    levothyroxine  (SYNTHROID ) 112 MCG tablet, Take 1 tablet (112 mcg total) by mouth daily., Disp: 30 tablet, Rfl: 0   Lidocaine  4 % PTCH, , Disp: , Rfl:    LORazepam  (ATIVAN ) 1 MG tablet, Take 1 mg by mouth every 4 (four) hours as needed for sleep., Disp: , Rfl:    Magnesium  500 MG TABS, Take 500 mg by mouth every other day., Disp: , Rfl:    mupirocin  ointment (BACTROBAN ) 2 %, Apply 1 application  topically 3 (three) times daily. (Patient not taking: Reported on 07/04/2023), Disp: , Rfl:    Nutritional Supplements (ENSURE PLANT-BASED PROTEIN) LIQD, Take 1 Units by mouth as needed., Disp: 330 mL, Rfl: 3   ondansetron  (ZOFRAN -ODT) 4 MG disintegrating tablet, Take 1 tablet (4 mg total) by mouth every 8 (eight) hours as needed for nausea or vomiting., Disp: 20 tablet, Rfl: 0   pantoprazole  (PROTONIX ) 40 MG tablet, Take 1 tablet (40 mg total) by mouth daily., Disp: 30 tablet, Rfl: 1   PARoxetine  (PAXIL ) 20 MG tablet, Take 1 tablet (20 mg total) by mouth daily., Disp: 60 tablet, Rfl: 0   prednisoLONE acetate (PRED FORTE) 1 % ophthalmic suspension, , Disp: , Rfl:    pyridoxine  (B-6) 100 MG tablet, Take 1 tablet (100 mg total) by mouth 2 (two) times daily. (Patient taking differently: Take 500 mg by mouth in the morning and at bedtime.), Disp: 60 tablet, Rfl: 0   Senna-Natural Laxatives (SENOKOT LAXATIVE) Tea Bag MISC, , Disp: , Rfl:    sucralfate  (CARAFATE ) 1 GM/10ML suspension, Take 10 mLs (1 g  total) by mouth 4 (four) times daily., Disp: 420 mL, Rfl: 1   terconazole  (TERAZOL 7 ) 0.4 % vaginal cream, , Disp: , Rfl:    Tiotropium Bromide  Monohydrate 2.5 MCG/ACT AERS, Inhale into the lungs., Disp: , Rfl:    traZODone  (DESYREL ) 50 MG tablet, Take 0.5 tablets (  25 mg total) by mouth at bedtime for 5 days., Disp: 2.5 tablet, Rfl: 0   triamcinolone  cream (KENALOG ) 0.1 %, Apply 1 Application topically 2 (two) times daily., Disp: 30 g, Rfl: 0   valsartan  (DIOVAN ) 320 MG tablet, Take 1 tablet (320 mg total) by mouth daily., Disp: 60 tablet, Rfl: 0   vitamin C  (ASCORBIC ACID ) 250 MG tablet, Take 1 tablet (250 mg total) by mouth daily., Disp: 90 tablet, Rfl: 0   Vitamin D , Ergocalciferol , (DRISDOL ) 1.25 MG (50000 UNIT) CAPS capsule, Take 1 capsule (50,000 Units total) by mouth every 7 (seven) days. (Patient taking differently: Take 5,000 Units by mouth every 7 (seven) days.), Disp: 12 capsule, Rfl: 3   XIIDRA 5 % SOLN, Place 1 drop into both eyes 2 (two) times daily., Disp: , Rfl:   Allergies: Allergies  Allergen Reactions   Amoxicillin -Pot Clavulanate Anaphylaxis and Hives   Aspirin Hives   Bee Venom Hives    Spider bites cause hives Spider bites cause hives   Cephalexin  Hives and Other (See Comments)    Can not take cephalexin  but stated tolerated cefpodoxime prescribed 02/2022    Ciprofloxacin  Anaphylaxis and Swelling    Per documentation in 2024 - patient has taken levofloxacin .  Patient confirmed can take levofloxacin  on 07/06/2023    Fluticasone  Shortness Of Breath   Haloperidol  Anaphylaxis, Nausea And Vomiting and Other (See Comments)    Serotonin syndrome   Molds & Smuts Shortness Of Breath and Itching    Eyes-itchy     Neomycin-Polymyxin-Dexameth Swelling    Eye swelling   Other Anaphylaxis, Hives, Itching, Swelling and Rash    Spider bites cause hives Hives/throat swelling    Peanuts [Peanut Oil] Anaphylaxis and Hives   Penicillins Anaphylaxis and Hives   Propoxyphene  Anaphylaxis and Hives    Hives/throat swelling     Sulfa Antibiotics Anaphylaxis, Hives and Itching   Sulfacetamide Sodium Anaphylaxis, Hives and Itching   Tramadol Hives and Other (See Comments)    Other reaction(s): Unknown   Amikacin Nausea And Vomiting and Other (See Comments)   Bee Pollen Other (See Comments)    Other reaction(s): Unknown   Imipramine Hives    Reaction to Tofranil    Oxycodone Nausea And Vomiting    Hallucinations   Prednisone Nausea And Vomiting, Anxiety and Other (See Comments)    Crazy mood swings, strange thoughts. Tolerates depomedrol, dexamethasone . States can't do prednisone but other corticosteroids ok   Colchicine  Other (See Comments)    Reports that it interacts badly with my paxil . Unable to describe exactly what reaction is.   Hymenoptera Venom Preparations Hives    Reaction to spider bites   Lactose Intolerance (Gi)    Peanut-Containing Drug Products    Phthalylsulfacetamide    Pine Swelling    Pine tree pollen   Pregabalin Other (See Comments)   Risperidone Other (See Comments)    Serotonin syndrome     Sulfacetamide    Wound Dressing Adhesive    Adhesive [Tape] Hives and Rash   Losartan      Patient states she can not take losartan  as interacts with her underlying conditions.  Other ARBs are OK.     Vistaril  [Hydroxyzine  Pamoate] Itching and Anxiety    Can NOT take Vistaril  but CAN TAKE Atarax  (takes atarax  daily)    Aws Shere, NP

## 2023-09-05 NOTE — ED Notes (Signed)
 Hospital meal provided, pt tolerated w/o complaints.  Waste discarded appropriately.

## 2023-09-05 NOTE — ED Notes (Signed)
 Lunch tray given to pt.

## 2023-09-05 NOTE — ED Provider Notes (Signed)
 Emergency Medicine Observation Re-evaluation Note  Angela [name] is a 73 y.o. female, seen on rounds today.  Pt initially presented to the ED for complaints of Psychiatric Evaluation  Currently, the patient is calm, no acute complaints.  Physical Exam  Blood pressure (!) 178/110, pulse (!) 55, temperature 97.8 F (36.6 C), temperature source Oral, resp. rate 16, height 5' 2 (1.575 m), weight 60 kg, SpO2 100%. Physical Exam General: NAD Lungs: CTAB Psych: not agitated  ED Course / MDM  EKG:    I have reviewed the labs performed to date as well as medications administered while in observation.  Recent changes in the last 24 hours include no acute events overnight.    Plan  Current plan is for psych admit. Patient is under full IVC at this time.   Viviann Pastor, MD 09/05/23 [phone]

## 2023-09-05 NOTE — ED Notes (Signed)
 Pt continues to refuse blood work. Per MD - blood work not necessary at this time.

## 2023-09-05 NOTE — ED Notes (Signed)
 Meal tray provided. Tray checked for potential hazards. Pt denies no other needs at this time.

## 2023-09-05 NOTE — BH Assessment (Signed)
 Destination  Service Provider Request Status Services Address Phone Fax Patient Preferred  [facility]  Pending - Request Sent -- [address] [place] [address] [phone] --  [facility]  Pending - Request Sent -- [address], [place] [address] [phone] --  [facility]  Pending - Request Sent -- [address] [place] [address] [phone] --  [facility]  Pending - Request Sent -- [address] [place] [address] [phone] --  [facility]  Pending - Request Sent -- [address], [place] [address] [phone] --  [facility]  Pending - Request Sent -- [address] [place] [address] [phone] --  [facility]  Pending - Request Sent -- [address] [place] [address] [phone] --  [facility]  Pending - Request Sent -- [address] [place], [place] [address] [phone] --  [facility]

## 2023-09-05 NOTE — ED Notes (Signed)
 Pt given [place] sandwich tray and water. Pt ate 100% and drank full cup of water.

## 2023-09-05 NOTE — ED Notes (Signed)
 Attempted blood draw for >54mins while utilizing therapeutic communication - unsuccessful. Pt manipulative and pushing boundaries.

## 2023-09-05 NOTE — ED Notes (Signed)
 Assumed care of patient in to room 6 from quad 20. No distress noted

## 2023-09-05 NOTE — ED Notes (Signed)
 Pt took night time meds willingly. This RN also gave pt a band aide for the spot on her toe she is concerned about and clean socks. Pt was satisfied and grateful for response to her concern.

## 2023-09-05 NOTE — ED Notes (Signed)
 Assumed care of pt. Report received from previous RN. Pt alert and oriented. Pt RR even and unlabored. Pt denies pain at this time. Pt calm and cooperative. Pt denies any needs at this time.

## 2023-09-05 NOTE — ED Notes (Signed)
 Patient frustrated that she was placed on IVC papers. She denies SI/HI and feels she is being held here without good reason. This RN awaiting response from psych NP. Notes are far inpatient psych placement.

## 2023-09-05 NOTE — ED Notes (Signed)
 Pt stripped the sheet off her bed and put it on the floor.

## 2023-09-05 NOTE — ED Notes (Signed)
 [facility] made 2 attempts to visit patient this AM and PM.  Patient was asleep both visits due to early morning medication received.  Patient attempted to wake up and engage this afternoon but was too drowsy to participate.  [facility] plan to follow up with patient to discuss the details of  their social determinants of health.  [place], [facility] [phone]

## 2023-09-05 NOTE — ED Notes (Signed)
 Pt given New Testament that was provided by the Chaplin at the pt's request.

## 2023-09-05 NOTE — ED Notes (Signed)
 ED Provider notified that pt is requesting her toe be looked at by a MD. She states her toe is black but toe is not black.

## 2023-09-05 NOTE — ED Notes (Addendum)
 PT asked if they could provide a urine sample. Pt stated that she would but that she could not do it at this time.

## 2023-09-05 NOTE — ED Notes (Signed)
 Phlebotomy technician at bedside to collect blood for lab work. Pt continues to refuse and is not redirectable.

## 2023-09-06 DIAGNOSIS — F609 Personality disorder, unspecified: Secondary | ICD-10-CM | POA: Diagnosis not present

## 2023-09-06 MED ORDER — LORAZEPAM 1 MG PO TABS
1.0000 mg | ORAL_TABLET | Freq: Once | ORAL | Status: DC
  Filled 2023-09-06: qty 1

## 2023-09-06 NOTE — ED Provider Notes (Signed)
 Emergency Medicine Observation Re-evaluation Note  Angela [name] is a 73 y.o. female, seen on rounds today.  Pt initially presented to the ED for complaints of Psychiatric Evaluation  Currently, the patient is no acute distress. Per bhu nurse had requested anxiety meds overnight but then didn't want to take it. Was up all night and now she is sleeping.  Physical Exam  Blood pressure (!) 150/92, pulse 77, temperature 98.5 F (36.9 C), temperature source Oral, resp. rate 18, height 5' 2 (1.575 m), weight 60 kg, SpO2 99%.  Physical Exam General: No apparent distress Pulm: Normal WOB Psych: resting     ED Course / MDM     I have reviewed the labs performed to date as well as medications administered while in observation.  Recent changes in the last 24 hours include none  Plan   Current plan is to continue to wait for psych plan/placement if felt warranted  Patient is under full IVC at this time.   Ernest Ronal BRAVO, MD 09/06/23 [phone]

## 2023-09-06 NOTE — ED Notes (Signed)
 Pt requesting to speak with the Chaplin, chaplin paged at this time.

## 2023-09-06 NOTE — ED Notes (Signed)
 Informed pt that she would be going to [facility] once transportation was set up.

## 2023-09-06 NOTE — BH Assessment (Signed)
 Patient has been accepted to [facility].  Patient assigned to main campus Accepting physician is Dr. Millie Manners.  Call report to [phone].  Representative was Thom.   ER Staff is aware of it:  [place], ER Michaela Knee, Patient's Nurse   Address: [address],  [place], [place] [address]

## 2023-09-06 NOTE — ED Notes (Signed)
 PT requesting Gatorade. Pt told she could have it if she took her medication that she was refusing. Pt kept arguing that she would take it but never did. RN once again stated that she could have her Gatorade after she took the medication.

## 2023-09-06 NOTE — BH Assessment (Signed)
 Referral information for Psychiatric Hospitalization faxed to;   [facility]  [phone]  [facility]  [phone]  [facility]  [phone]  [facility]  [phone]  [facility]  [phone]  [facility]  [phone]  [facility]  [phone]  [facility]  [phone]  [facility]  [phone]  [facility] Patient Placement  [phone]  [facility]  [phone]  [facility]  [phone]  [facility]  [phone]  [facility]  [phone]  [place] [facility]FAX  [phone]  [facility]  [phone]

## 2023-09-06 NOTE — ED Notes (Addendum)
 After multiple attempts to obtain VS at the time of transfer. Pt continues to refused states that we have been tricking her and that she does not want to go to [facility]. Pt refused VS but willing walked out the department at the transfer.

## 2023-09-06 NOTE — ED Notes (Signed)
 Pt sitting in the dayroom crying and upset. This RN asked pt is she was okay. States that she doesn't want to be here and that this RN would not allow her to speak to the psychiatrist or the doctor about her toe. Explain that this was the first time speaking to the pt about her wanting to speak to the provider and that right now they want to send her to [facility] for inpatient treatment. Pt asked if she had a choice, explained that she was under IVC and would not be able to leave at this time. Pt walked off and said she did not want to speak to this RN anymore.

## 2023-09-06 NOTE — ED Notes (Signed)
 [facility] called for Transport to [facility] , spoke with [facility]

## 2023-09-06 NOTE — ED Notes (Signed)
 Tech and RN wiped down and changed pts bed per pt request

## 2023-09-06 NOTE — ED Notes (Signed)
 Breakfast and dinner provided to pt

## 2023-09-06 NOTE — ED Notes (Signed)
 ivc/consult done/recommended for inpatient psychiatric hospitalization after medical hospitalization.

## 2023-09-06 NOTE — ED Notes (Addendum)
 Pt stripped her bed and said that it needed to be wiped down with disinfectant wipes and a clean sheet put on. This RN wiped the mattress down and ED Tech Anyha made the bed with clean bedding. PT requesting medication to help her relax. ED provider made aware. See new orders.

## 2023-09-06 NOTE — ED Notes (Signed)
 This RN went to give pt medication that pt asked for and the ED Provider prescribed and pt refused.

## 2023-09-06 NOTE — ED Notes (Signed)
 Pt stating to this RN (when I gave her synthroid ) that she is very exhausted after staying up most of the night. PT is now laying down resting.

## 2023-09-06 NOTE — ED Notes (Signed)
 Pt given cup of water at their request.

## 2023-09-07 NOTE — Progress Notes (Signed)
 Orders only

## 2023-10-26 ENCOUNTER — Encounter: Payer: Self-pay | Admitting: Emergency Medicine

## 2024-01-15 ENCOUNTER — Ambulatory Visit: Payer: Self-pay

## 2024-01-15 NOTE — Telephone Encounter (Signed)
 Reason for Disposition  SEVERE difficulty breathing (e.g., struggling for each breath, speaks in single words)  Answer Assessment - Initial Assessment Questions Hasn't seen pulmonologist since 2020 Onset 3 days ago Chest congestion, cough, runny nose, chill, fever Patient states she is having difficulty taking each breath. Writer advised the patient to call 911 and offered to assist with calling. Patient stated she didn't think the ED was the best place for her because she didn't want to get exposed to anything else. Writer explained the rationale to the pt. Writer unsure if the patient will go.   1. SYMPTOMS: What is your main symptom or concern? (e.g., cough, fever, shortness of breath, muscle aches)     SOB 2. ONSET: When did the symptoms start?      3 days ago 3. COUGH: Do you have a cough? If Yes, ask: How bad is the cough?       yes 4. FEVER: Do you have a fever? If Yes, ask: What is your temperature, how was it measured, and when did it start?     Yes - no thermometer  5. BREATHING DIFFICULTY: Are you having any difficulty breathing? (e.g., normal; shortness of breath, wheezing, unable to speak)      Yes - difficulty with each breath 6. BETTER-SAME-WORSE: Are you getting better, staying the same or getting worse compared to yesterday?  If getting worse, ask, In what way?     N/a  7. OTHER SYMPTOMS: Do you have any other symptoms?  (e.g., chills, fatigue, headache, loss of smell or taste, muscle pain, sore throat)     Chills, fever, cough, runny nose 8. INFLUENZA EXPOSURE: Was there any known exposure to influenza (flu) before the symptoms began?      N/a 9. INFLUENZA SUSPECTED: Why do you think you have influenza? (e.g., positive flu self-test at home, symptoms after exposure).     N/a  10. INFLUENZA VACCINE: Have you had the flu vaccine? If Yes, ask: When did you last get it?       N/a  11. HIGH RISK FOR COMPLICATIONS: Do you have any chronic medical  problems? (e.g., asthma, heart or lung disease, obesity, weak immune system)       N/a  Protocols used: Influenza (Flu) Suspected-A-AH  Copied from CRM [id number]. Topic: Clinical - Red Word Triage >> Jan 15, 2024  8:24 AM Essie A wrote: Red Word that prompted transfer to Nurse Triage: Trouble breathing, fever, chills, cough and runny nose.  Started several days ago.

## 2024-01-19 ENCOUNTER — Ambulatory Visit
Admission: EM | Admit: 2024-01-19 | Discharge: 2024-01-19 | Disposition: A | Discharge status: Home / Self Care | Attending: Family Medicine | Admitting: Family Medicine

## 2024-01-19 DIAGNOSIS — J449 Chronic obstructive pulmonary disease, unspecified: Secondary | ICD-10-CM | POA: Diagnosis not present

## 2024-01-19 DIAGNOSIS — J069 Acute upper respiratory infection, unspecified: Secondary | ICD-10-CM

## 2024-01-19 DIAGNOSIS — B9689 Other specified bacterial agents as the cause of diseases classified elsewhere: Secondary | ICD-10-CM | POA: Diagnosis not present

## 2024-01-19 MED ORDER — LEVOFLOXACIN 750 MG PO TABS: 750.0000 mg | ORAL_TABLET | Freq: Every day | ORAL | 0 refills | Status: DC

## 2024-01-19 MED ORDER — FLUCONAZOLE 150 MG PO TABS: 150.0000 mg | ORAL_TABLET | ORAL | 0 refills | Status: DC

## 2024-01-19 NOTE — ED Triage Notes (Signed)
 Pt reports nasal congestion, greens mucus, chest congestion and cough over a week. Mucinex , Nasacort , ginger tea.

## 2024-01-19 NOTE — ED Provider Notes (Signed)
 " [facility]  Note:  This document was prepared using Conservation officer, historic buildings and may include unintentional dictation errors.  MRN: [id number] DOB: 06-22-1950  Subjective:   Angela [name] is a 74 y.o. female presenting for 1 week history of sinus congestion, drainage, scratchy throat, coughing and chest congestion.  Has a history of COPD.  Is requesting a nebulizer to be dispensed to her.  Current Outpatient Medications  Medication Instructions   acetaminophen  (TYLENOL ) 650 mg, Oral, Every 8 hours PRN   albuterol  (VENTOLIN  HFA) 108 (90 Base) MCG/ACT inhaler albuterol  sulfate HFA 90 mcg/actuation aerosol inhaler  INHALE 2 PUFFS BY MOUTH EVERY 4 HOURS AS NEEDED   allopurinol  (ZYLOPRIM ) 100 mg, Oral, Daily   allopurinol  (ZYLOPRIM ) 100 mg, Oral, Daily   amLODipine  (NORVASC ) 5 mg, Oral, Daily   ascorbic acid  (VITAMIN C ) 2,000 mg, Oral, Daily   benzonatate  (TESSALON  PERLES) 100 mg, Oral, 3 times daily PRN   calcium  carbonate (OSCAL) 1500 (600 Ca) MG TABS tablet Calcium  600 mg calcium  (1,500 mg) tablet  Take 1 tablet 3 times a day by oral route.   diclofenac  Sodium (VOLTAREN ) 4 g, Topical, 4 times daily   dicyclomine  (BENTYL ) 20 mg, Oral, 3 times daily before meals   EPINEPHrine  0.3 mg/0.3 mL IJ SOAJ injection Subcutaneous   estradiol  (ESTRACE ) 0.1 MG/GM vaginal cream 1 Applicatorful, Vaginal, Daily   famotidine  (PEPCID ) 20 mg, Oral, 2 times daily PRN   levalbuterol  (XOPENEX ) 1.25 MG/3ML nebulizer solution 3 mLs, Nebulization, Every 6 hours PRN   levothyroxine  (SYNTHROID ) 112 mcg, Oral, Daily   Lidocaine  4 % PTCH    LORazepam  (ATIVAN ) 1 mg, Oral, Every 4 hours PRN   Magnesium  500 mg, Oral, Every other day   mupirocin  ointment (BACTROBAN ) 2 % 1 application , 3 times daily   NAC 500 mg, Oral, 2 times daily   Nutritional Supplements (ENSURE PLANT-BASED PROTEIN) LIQD 1 Units, Oral, As needed   ondansetron  (ZOFRAN -ODT) 4 mg, Oral, Every 8 hours PRN    pantoprazole  (PROTONIX ) 40 mg, Oral, Daily   PARoxetine  (PAXIL ) 20 mg, Oral, Daily   prednisoLONE acetate (PRED FORTE) 1 % ophthalmic suspension    pyridoxine  (B-6) 100 mg, Oral, 2 times daily   Senna-Natural Laxatives (SENOKOT LAXATIVE) Tea Bag MISC    sucralfate  (CARAFATE ) 1 g, Oral, 4 times daily   terconazole  (TERAZOL 7 ) 0.4 % vaginal cream    Tiotropium Bromide  Monohydrate 2.5 MCG/ACT AERS Inhalation   traZODone  (DESYREL ) 25 mg, Oral, Daily at bedtime   triamcinolone  cream (KENALOG ) 0.1 % 1 Application, Topical, 2 times daily   valsartan  (DIOVAN ) 320 mg, Oral, Daily   Vitamin B-12 1,000 mcg, Sublingual   vitamin C  (ASCORBIC ACID ) 250 mg, Oral, Daily   Vitamin D  (Ergocalciferol ) (DRISDOL ) 50,000 Units, Oral, Every 7 days   XIIDRA 5 % SOLN 1 drop, Both Eyes, 2 times daily    Allergies[1]  Past Medical History:  Diagnosis Date   Agitation 09/18/2017   Allergic rhinitis    Altered mental status, unspecified 12/25/2018   Anemia    Blurred vision    Borderline personality disorder (HCC)    Chest pain of uncertain etiology 05/31/2018   Cluster B personality disorder (HCC)    Confusion state 12/25/2018   COPD (chronic obstructive pulmonary disease) (HCC)    Depression    Diverticulosis    Endometriosis    Falls    GERD (gastroesophageal reflux disease)    Hernia, hiatal    Hypothyroidism  IBS (irritable bowel syndrome)    Labile mood    Malignant neoplasm of skin    Malnutrition of mild degree 08/08/2018   Migraine    Neuropathy    Noncompliance 12/25/2017   PTSD (post-traumatic stress disorder)    SIADH (syndrome of inappropriate ADH production) 09/08/2016   Thyroid  disease      Past Surgical History:  Procedure Laterality Date   APPENDECTOMY     CHOLECYSTECTOMY     CHOLECYSTECTOMY, LAPAROSCOPIC     ESOPHAGOGASTRODUODENOSCOPY (EGD) WITH PROPOFOL  N/A 03/29/2015   Procedure: ESOPHAGOGASTRODUODENOSCOPY (EGD) WITH PROPOFOL ;  Surgeon: Donnice Vaughn Manes, MD;   Location: [facility] ENDOSCOPY;  Service: Endoscopy;  Laterality: N/A;   HERNIA REPAIR     laproscopy     TONSILLECTOMY     uteral suspension      Family History  Problem Relation Age of Onset   Heart disease Father    Hearing loss Father    COPD Father    Depression Father    Stroke Father    Vision loss Father    Varicose Veins Father    Cancer Mother        lung   Depression Sister    Arthritis Sister    Arthritis Brother    Hernia Brother    Anxiety disorder Brother    Arthritis Brother    Hernia Brother    Anxiety disorder Brother    Urolithiasis Neg Hx    Kidney disease Neg Hx    Kidney cancer Neg Hx    Prostate cancer Neg Hx    Breast cancer Neg Hx     Social History   Occupational History   Occupation: Retired  Tobacco Use   Smoking status: Former    Current packs/day: 0.00    Types: Cigarettes    Quit date: 11/17/1976    Years since quitting: 47.2   Smokeless tobacco: Never  Vaping Use   Vaping status: Never Used  Substance and Sexual Activity   Alcohol  use: Yes    Comment: occ   Drug use: No   Sexual activity: Not Currently     ROS   Objective:   Vitals: BP 125/71 (BP Location: Left Arm)   Pulse 66   Temp 98.6 F (37 C) (Oral)   Resp 18   SpO2 94%   Physical Exam Constitutional:      General: She is not in acute distress.    Appearance: Normal appearance. She is well-developed and normal weight. She is not ill-appearing, toxic-appearing or diaphoretic.  HENT:     Head: Normocephalic and atraumatic.     Right Ear: Tympanic membrane, ear canal and external ear normal. No drainage or tenderness. No middle ear effusion. There is no impacted cerumen. Tympanic membrane is not erythematous or bulging.     Left Ear: Tympanic membrane, ear canal and external ear normal. No drainage or tenderness.  No middle ear effusion. There is no impacted cerumen. Tympanic membrane is not erythematous or bulging.     Nose: Congestion present. No rhinorrhea.      Mouth/Throat:     Mouth: Mucous membranes are moist. No oral lesions.     Pharynx: Posterior oropharyngeal erythema (with associated postnasal streaks overlying pharynx) present. No pharyngeal swelling, oropharyngeal exudate or uvula swelling.     Tonsils: No tonsillar exudate or tonsillar abscesses.  Eyes:     General: No scleral icterus.       Right eye: No discharge.  Left eye: No discharge.     Extraocular Movements: Extraocular movements intact.     Right eye: Normal extraocular motion.     Left eye: Normal extraocular motion.     Conjunctiva/sclera: Conjunctivae normal.  Cardiovascular:     Rate and Rhythm: Normal rate and regular rhythm.     Heart sounds: Normal heart sounds. No murmur heard.    No friction rub. No gallop.  Pulmonary:     Effort: Pulmonary effort is normal. No respiratory distress.     Breath sounds: No stridor. No wheezing, rhonchi or rales.  Chest:     Chest wall: No tenderness.  Musculoskeletal:     Cervical back: Normal range of motion and neck supple.  Lymphadenopathy:     Cervical: No cervical adenopathy.  Skin:    General: Skin is warm and dry.  Neurological:     General: No focal deficit present.     Mental Status: She is alert and oriented to person, place, and time.  Psychiatric:        Mood and Affect: Mood normal.        Behavior: Behavior normal.    A nebulizer machine was dispensed to the patient.  Assessment and Plan :   PDMP not reviewed this encounter.  1. Bacterial upper respiratory infection   2. Chronic obstructive pulmonary disease, unspecified COPD type (HCC)      Deferred imaging given clear cardiopulmonary exam, hemodynamically stable vital signs.  I honored patient's request to have a nebulizer dispensed to her given her history of COPD and respiratory symptoms.  Will manage for bacterial upper respiratory infection with levofloxacin  at patient request.  Has a significant number of allergies but has had more success  with levofloxacin .  Counseled patient on potential for adverse effects with medications prescribed/recommended today, ER and return-to-clinic precautions discussed, patient verbalized understanding.     [1]  Allergies Allergen Reactions   Amoxicillin -Pot Clavulanate Anaphylaxis and Hives   Aspirin Hives   Bee Venom Hives    Spider bites cause hives Spider bites cause hives   Cephalexin  Hives and Other (See Comments)    Can not take cephalexin  but stated tolerated cefpodoxime prescribed 02/2022    Ciprofloxacin  Anaphylaxis and Swelling    Per documentation in 2024 - patient has taken levofloxacin .  Patient confirmed can take levofloxacin  on 07/06/2023    Fluticasone  Shortness Of Breath   Haloperidol  Anaphylaxis, Nausea And Vomiting and Other (See Comments)    Serotonin syndrome   Molds & Smuts Shortness Of Breath and Itching    Eyes-itchy     Neomycin-Polymyxin-Dexameth Swelling    Eye swelling   Other Anaphylaxis, Hives, Itching, Swelling and Rash    Spider bites cause hives Hives/throat swelling    Peanuts [Peanut Oil] Anaphylaxis and Hives   Penicillins Anaphylaxis and Hives   Propoxyphene Anaphylaxis and Hives    Hives/throat swelling     Sulfa Antibiotics Anaphylaxis, Hives and Itching   Sulfacetamide Sodium Anaphylaxis, Hives and Itching   Tramadol Hives and Other (See Comments)    Other reaction(s): Unknown   Amikacin Nausea And Vomiting and Other (See Comments)   Bee Pollen Other (See Comments)    Other reaction(s): Unknown   Imipramine Hives    Reaction to Tofranil    Oxycodone Nausea And Vomiting    Hallucinations   Prednisone Nausea And Vomiting, Anxiety and Other (See Comments)    Crazy mood swings, strange thoughts. Tolerates depomedrol, dexamethasone . States can't do prednisone but other corticosteroids  ok   Colchicine  Other (See Comments)    Reports that it interacts badly with my paxil . Unable to describe exactly what reaction is.   Hymenoptera  Venom Preparations Hives    Reaction to spider bites   Lactose Intolerance (Gi)    Peanut-Containing Drug Products    Phthalylsulfacetamide    Pine Swelling    Pine tree pollen   Pregabalin Other (See Comments)   Risperidone Other (See Comments)    Serotonin syndrome     Sulfacetamide    Wound Dressing Adhesive    Adhesive [Tape] Hives and Rash   Losartan      Patient states she can not take losartan  as interacts with her underlying conditions.  Other ARBs are OK.     Vistaril  [Hydroxyzine  Pamoate] Itching and Anxiety    Can NOT take Vistaril  but CAN TAKE Atarax  (takes atarax  daily)     Christopher Savannah, [place] 01/20/24 9095  "

## 2024-01-19 NOTE — Discharge Instructions (Addendum)
 Start levofloxacin  to help with a bacterial upper respiratory infection.

## 2024-01-21 ENCOUNTER — Other Ambulatory Visit: Payer: Self-pay

## 2024-01-21 ENCOUNTER — Encounter (HOSPITAL_COMMUNITY): Payer: Self-pay

## 2024-01-21 ENCOUNTER — Emergency Department (HOSPITAL_COMMUNITY)
Admission: EM | Admit: 2024-01-21 | Discharge: 2024-01-22 | Discharge status: Against Medical Advice | Attending: Emergency Medicine | Admitting: Emergency Medicine

## 2024-01-21 ENCOUNTER — Emergency Department (HOSPITAL_COMMUNITY)

## 2024-01-21 DIAGNOSIS — R079 Chest pain, unspecified: Secondary | ICD-10-CM | POA: Diagnosis present

## 2024-01-21 DIAGNOSIS — R0602 Shortness of breath: Secondary | ICD-10-CM | POA: Insufficient documentation

## 2024-01-21 DIAGNOSIS — Z5321 Procedure and treatment not carried out due to patient leaving prior to being seen by health care provider: Secondary | ICD-10-CM | POA: Diagnosis not present

## 2024-01-21 DIAGNOSIS — R058 Other specified cough: Secondary | ICD-10-CM | POA: Diagnosis not present

## 2024-01-21 LAB — CBC
HCT: 36.5 % (ref 36.0–46.0)
Hemoglobin: 11.5 g/dL — ABNORMAL LOW (ref 12.0–15.0)
MCH: 28.3 pg (ref 26.0–34.0)
MCHC: 31.5 g/dL (ref 30.0–36.0)
MCV: 89.9 fL (ref 80.0–100.0)
Platelets: 394 K/uL (ref 150–400)
RBC: 4.06 MIL/uL (ref 3.87–5.11)
RDW: 14.6 % (ref 11.5–15.5)
WBC: 10.3 K/uL (ref 4.0–10.5)
nRBC: 0 % (ref 0.0–0.2)

## 2024-01-21 LAB — TROPONIN T, HIGH SENSITIVITY: Troponin T High Sensitivity: <15 ng/L (ref 0–19)

## 2024-01-21 LAB — BASIC METABOLIC PANEL WITH GFR
Anion gap: 14 (ref 5–15)
BUN: 17 mg/dL (ref 8–23)
CO2: 22 mmol/L (ref 22–32)
Calcium: 8.9 mg/dL (ref 8.9–10.3)
Chloride: 99 mmol/L (ref 98–111)
Creatinine, Ser: 0.67 mg/dL (ref 0.44–1.00)
GFR, Estimated: >60 mL/min
Glucose, Bld: 77 mg/dL (ref 70–99)
Potassium: 4 mmol/L (ref 3.5–5.1)
Sodium: 135 mmol/L (ref 135–145)

## 2024-01-21 NOTE — ED Triage Notes (Signed)
 PER EMS: pt is from home with c/o chest pain onset 2 days. Recent diagnosed with URI. She also reports left grip weakness, onset yesterday.   BP- 150/90 HR- 70 O2-97% RA CBG-100

## 2024-01-21 NOTE — ED Notes (Signed)
 Pt is not allowing the staff to obtain vital signs or continue with any care.

## 2024-01-21 NOTE — ED Provider Triage Note (Signed)
 Emergency Medicine Provider Triage Evaluation Note  Shellie Rogoff , a 74 y.o. female  was evaluated in triage.  Pt complains of chest pain weeks on L side. Endorses productive cough with green phlegm, shortness of breath x 2 weeks. Described as a sharp stabbing pain. Has not taken anything for pain. Has been taking levoquin x 2 days.  Incredibly angry and argumentative. Nausea  Denies abdominal pain,  v/d, LE swelling  Review of Systems  Positive: N/a Negative: N/a  Physical Exam  BP (!) 141/83 (BP Location: Left Arm)   Pulse 64   Temp 99.1 F (37.3 C) (Oral)   Resp 16   Ht 5' 2 (1.575 m)   Wt 59.9 kg   SpO2 100%   BMI 24.14 kg/m  Gen:   Awake, no distress   Resp:  Normal effort  MSK:   Moves extremities without difficulty  Other:    Medical Decision Making  Medically screening exam initiated at 7:06 PM.  Appropriate orders placed.  Rosary Filosa was informed that the remainder of the evaluation will be completed by another provider, this initial triage assessment does not replace that evaluation, and the importance of remaining in the ED until their evaluation is complete.     Beola Terrall RAMAN, [place] 01/21/24 1912

## 2024-01-21 NOTE — ED Notes (Signed)
 Tech attempted to obtain vitals and EKG. Pt. Stated, You are a jerk and I do not want you touching me. RN aware

## 2024-01-22 MED ORDER — ACETAMINOPHEN 325 MG PO TABS
650.0000 mg | ORAL_TABLET | Freq: Once | ORAL | Status: DC
  Filled 2024-01-22: qty 2

## 2024-01-22 NOTE — ED Notes (Signed)
 Pt refused lab work at this time and pt is being aggressive towards staff. RN aware

## 2024-01-22 NOTE — ED Notes (Signed)
 Pt refused tylenol  started screaming and cursing staff

## 2024-01-22 NOTE — ED Notes (Signed)
Pt returned to the lobby

## 2024-01-22 NOTE — ED Notes (Signed)
 Pt seen leaving the ED in an uber (car) according to [employer] outside

## 2024-01-30 ENCOUNTER — Ambulatory Visit (HOSPITAL_COMMUNITY)
Admission: EM | Admit: 2024-01-30 | Discharge: 2024-01-31 | Disposition: A | Discharge status: Other Acute Inpatient Hospital | Attending: Psychiatry | Admitting: Psychiatry

## 2024-01-30 DIAGNOSIS — Z79899 Other long term (current) drug therapy: Secondary | ICD-10-CM | POA: Diagnosis not present

## 2024-01-30 DIAGNOSIS — F319 Bipolar disorder, unspecified: Secondary | ICD-10-CM | POA: Insufficient documentation

## 2024-01-30 DIAGNOSIS — Z5900 Homelessness unspecified: Secondary | ICD-10-CM | POA: Diagnosis not present

## 2024-01-30 DIAGNOSIS — F209 Schizophrenia, unspecified: Secondary | ICD-10-CM | POA: Insufficient documentation

## 2024-01-30 DIAGNOSIS — D649 Anemia, unspecified: Secondary | ICD-10-CM | POA: Insufficient documentation

## 2024-01-30 DIAGNOSIS — Z8673 Personal history of transient ischemic attack (TIA), and cerebral infarction without residual deficits: Secondary | ICD-10-CM | POA: Diagnosis not present

## 2024-01-30 DIAGNOSIS — J449 Chronic obstructive pulmonary disease, unspecified: Secondary | ICD-10-CM | POA: Insufficient documentation

## 2024-01-30 DIAGNOSIS — R400 Somnolence: Secondary | ICD-10-CM | POA: Diagnosis not present

## 2024-01-30 DIAGNOSIS — F603 Borderline personality disorder: Secondary | ICD-10-CM | POA: Insufficient documentation

## 2024-01-30 DIAGNOSIS — R001 Bradycardia, unspecified: Secondary | ICD-10-CM

## 2024-01-30 DIAGNOSIS — K449 Diaphragmatic hernia without obstruction or gangrene: Secondary | ICD-10-CM | POA: Diagnosis not present

## 2024-01-30 DIAGNOSIS — E039 Hypothyroidism, unspecified: Secondary | ICD-10-CM | POA: Diagnosis not present

## 2024-01-30 DIAGNOSIS — R011 Cardiac murmur, unspecified: Secondary | ICD-10-CM | POA: Diagnosis not present

## 2024-01-30 DIAGNOSIS — I119 Hypertensive heart disease without heart failure: Secondary | ICD-10-CM | POA: Diagnosis not present

## 2024-01-30 DIAGNOSIS — F333 Major depressive disorder, recurrent, severe with psychotic symptoms: Secondary | ICD-10-CM | POA: Diagnosis present

## 2024-01-30 DIAGNOSIS — F29 Unspecified psychosis not due to a substance or known physiological condition: Secondary | ICD-10-CM | POA: Diagnosis not present

## 2024-01-30 LAB — LIPID PANEL
Cholesterol: 158 mg/dL (ref 0–200)
HDL: 60 mg/dL
LDL Cholesterol: 85 mg/dL (ref 0–99)
Total CHOL/HDL Ratio: 2.6 ratio
Triglycerides: 64 mg/dL
VLDL: 13 mg/dL (ref 0–40)

## 2024-01-30 LAB — CBC WITH DIFFERENTIAL/PLATELET
Abs Immature Granulocytes: 0.02 K/uL (ref 0.00–0.07)
Basophils Absolute: 0.1 K/uL (ref 0.0–0.1)
Basophils Relative: 1 %
Eosinophils Absolute: 0.2 K/uL (ref 0.0–0.5)
Eosinophils Relative: 4 %
HCT: 34.7 % — ABNORMAL LOW (ref 36.0–46.0)
Hemoglobin: 11.1 g/dL — ABNORMAL LOW (ref 12.0–15.0)
Immature Granulocytes: 0 %
Lymphocytes Relative: 22 %
Lymphs Abs: 1.3 K/uL (ref 0.7–4.0)
MCH: 28.2 pg (ref 26.0–34.0)
MCHC: 32 g/dL (ref 30.0–36.0)
MCV: 88.3 fL (ref 80.0–100.0)
Monocytes Absolute: 0.7 K/uL (ref 0.1–1.0)
Monocytes Relative: 11 %
Neutro Abs: 3.6 K/uL (ref 1.7–7.7)
Neutrophils Relative %: 62 %
Platelets: 416 K/uL — ABNORMAL HIGH (ref 150–400)
RBC: 3.93 MIL/uL (ref 3.87–5.11)
RDW: 15.2 % (ref 11.5–15.5)
WBC: 5.8 K/uL (ref 4.0–10.5)
nRBC: 0 % (ref 0.0–0.2)

## 2024-01-30 LAB — COMPREHENSIVE METABOLIC PANEL WITH GFR
ALT: 15 U/L (ref 0–44)
AST: 25 U/L (ref 15–41)
Albumin: 4.2 g/dL (ref 3.5–5.0)
Alkaline Phosphatase: 103 U/L (ref 38–126)
Anion gap: 10 (ref 5–15)
BUN: 16 mg/dL (ref 8–23)
CO2: 28 mmol/L (ref 22–32)
Calcium: 9 mg/dL (ref 8.9–10.3)
Chloride: 98 mmol/L (ref 98–111)
Creatinine, Ser: 0.75 mg/dL (ref 0.44–1.00)
GFR, Estimated: >60 mL/min
Glucose, Bld: 93 mg/dL (ref 70–99)
Potassium: 4.3 mmol/L (ref 3.5–5.1)
Sodium: 136 mmol/L (ref 135–145)
Total Bilirubin: 0.2 mg/dL (ref 0.0–1.2)
Total Protein: 7.1 g/dL (ref 6.5–8.1)

## 2024-01-30 LAB — MAGNESIUM: Magnesium: 2.6 mg/dL — ABNORMAL HIGH (ref 1.7–2.4)

## 2024-01-30 LAB — FOLATE: Folate: 17.7 ng/mL

## 2024-01-30 LAB — HEMOGLOBIN A1C
Hgb A1c MFr Bld: 6.1 % — ABNORMAL HIGH (ref 4.8–5.6)
Mean Plasma Glucose: 128.37 mg/dL

## 2024-01-30 LAB — ETHANOL: Alcohol, Ethyl (B): <15 mg/dL

## 2024-01-30 LAB — TSH: TSH: 30.6 u[IU]/mL — ABNORMAL HIGH (ref 0.350–4.500)

## 2024-01-30 LAB — VITAMIN B12: Vitamin B-12: 910 pg/mL (ref 180–914)

## 2024-01-30 LAB — SARS CORONAVIRUS 2 BY RT PCR: SARS Coronavirus 2 by RT PCR: NEGATIVE

## 2024-01-30 MED ORDER — TRAZODONE HCL 50 MG PO TABS
50.0000 mg | ORAL_TABLET | Freq: Every evening | ORAL | Status: DC | PRN
  Administered 2024-01-30: 50 mg via ORAL
  Filled 2024-01-30: qty 1

## 2024-01-30 MED ORDER — TIOTROPIUM BROMIDE 2.5 MCG/ACT IN AERS: 2.0000 | INHALATION_SPRAY | Freq: Every day | RESPIRATORY_TRACT | Status: DC

## 2024-01-30 MED ORDER — OLANZAPINE 5 MG PO TBDP
5.0000 mg | ORAL_TABLET | Freq: Three times a day (TID) | ORAL | Status: DC | PRN
  Administered 2024-01-30 – 2024-01-31 (×2): 5 mg via ORAL
  Filled 2024-01-30 (×3): qty 1

## 2024-01-30 MED ORDER — UMECLIDINIUM BROMIDE 62.5 MCG/ACT IN AEPB
1.0000 | INHALATION_SPRAY | Freq: Every day | RESPIRATORY_TRACT | Status: DC
  Administered 2024-01-30 – 2024-01-31 (×2): 1 via RESPIRATORY_TRACT
  Filled 2024-01-30: qty 7

## 2024-01-30 MED ORDER — HYDROXYZINE HCL 25 MG PO TABS
25.0000 mg | ORAL_TABLET | Freq: Three times a day (TID) | ORAL | Status: DC | PRN
  Administered 2024-01-30: 25 mg via ORAL
  Filled 2024-01-30: qty 1

## 2024-01-30 MED ORDER — ACETAMINOPHEN 325 MG PO TABS: 650.0000 mg | ORAL_TABLET | Freq: Four times a day (QID) | ORAL | Status: DC | PRN

## 2024-01-30 MED ORDER — ALUM & MAG HYDROXIDE-SIMETH 200-200-20 MG/5ML PO SUSP: 30.0000 mL | ORAL | Status: DC | PRN

## 2024-01-30 MED ORDER — ALBUTEROL SULFATE HFA 108 (90 BASE) MCG/ACT IN AERS
1.0000 | INHALATION_SPRAY | Freq: Four times a day (QID) | RESPIRATORY_TRACT | Status: DC | PRN
  Administered 2024-01-31: 2 via RESPIRATORY_TRACT
  Filled 2024-01-30: qty 6.7

## 2024-01-30 MED ORDER — MAGNESIUM HYDROXIDE 400 MG/5ML PO SUSP: 30.0000 mL | Freq: Every day | ORAL | Status: DC | PRN

## 2024-01-30 MED ORDER — LOSARTAN POTASSIUM 50 MG PO TABS
100.0000 mg | ORAL_TABLET | Freq: Every day | ORAL | Status: DC
  Administered 2024-01-30 – 2024-01-31 (×2): 100 mg via ORAL
  Filled 2024-01-30 (×2): qty 2

## 2024-01-30 MED ORDER — OLANZAPINE 10 MG IM SOLR: 5.0000 mg | Freq: Three times a day (TID) | INTRAMUSCULAR | Status: DC | PRN

## 2024-01-30 NOTE — ED Notes (Signed)
 Pt sleeping in no acute distress. RR even and unlabored. Environment secured. Will continue to monitor for safety.

## 2024-01-30 NOTE — Progress Notes (Signed)
" °   01/30/24 0815  [facility] Triage Screening (Walk-ins at [facility] only)  How Did You Hear About Us ? Legal System  What Is the Reason for Your Visit/Call Today? Angela [name] is a 74 year old female presenting to [facility] escorted by [employer]. Pt states,  If I dont obey God I will die. Pt appears to be tearful, paranoid and agitated throughout triage. Pt endorses AH at this time, but denies VH. Pt states she is onoly taking blood pressure medication at this time. Pt denies substance use, Si, Hi and AVH, NSSIB.  How Long Has This Been Causing You Problems? <Week  Have You Recently Had Any Thoughts About Hurting Yourself? No  Are You Planning to Commit Suicide/Harm Yourself At This time? No  Have you Recently Had Thoughts About Hurting Someone Sherral? No  Are You Planning To Harm Someone At This Time? No  Self-Neglect Denies  Possible abuse reported to: Other (Comment)  Are you currently experiencing any auditory, visual or other hallucinations? Yes  Please explain the hallucinations you are currently experiencing: AH  Have You Used Any Alcohol  or Drugs in the Past 24 Hours? No  Do you have any current medical co-morbidities that require immediate attention? No  Clinician description of patient physical appearance/behavior: agitated, paranoid  What Do You Feel Would Help You the Most Today? Medication(s)  If access to [facility] was not available, would you have sought care in the Emergency Department? No  Determination of Need Urgent (48 hours)  Options For Referral Medication Management;Inpatient Hospitalization  Determination of Need filed? Yes    "

## 2024-01-30 NOTE — Progress Notes (Signed)
 Pt has been accepted to [facility] TODAY( 01/30/2024 Bed assignment: Main campus  Pt meets inpatient criteria per: Dr. Lynnette Barter MD  Attending Physician will be Millie Manners, MD  Report can be called to: [phone] (this is a pager, please leave call-back number when giving report)  Pt can arrive ASAP  Care Team Notified: Lynnette Barter MD   [place] Niani Mourer LCSW  01/30/2024 11:29 AM

## 2024-01-30 NOTE — ED Notes (Signed)
 Patient standing, marching in place, and praying loudly with both hands in the air. She also appears to be responding and endorsing AH/VH. Patient paranoid and suspicious of medication. Refused to take PRN Zyprexa  PO.

## 2024-01-30 NOTE — BH Assessment (Signed)
 Comprehensive Clinical Assessment (CCA) Note  01/30/2024 Angela [name] [id number]  DISPOSITION: Per Dr. Lynnette, pt is recommended for inpatient psychiatric admission  The patient demonstrates the following risk factors for suicide: Chronic risk factors for suicide include: psychiatric disorder of Bipolar d/o per chart. Acute risk factors for suicide include: unemployment and social withdrawal/isolation. Protective factors for this patient include: none identified. Considering these factors, the overall suicide risk at this point appears to be moderate. Patient is appropriate for outpatient follow up.   Per Triage assessment: Angela [name] is a 74 year old female presenting to [facility] escorted by GPD/BHRT. Pt states,  If I dont obey God I will die. Pt appears to be tearful, paranoid and agitated throughout triage. Pt endorses AH at this time, but denies VH. Pt states she is onoly taking blood pressure medication at this time. Pt denies substance use, Si, Hi and AVH, NSSIB.   With further assessment: Pt is a 74 yo female who presented physically and mentally exhausted. Pt stated that she does not want to live anymore because she does not want to go outside in the world anymore. Pt is currently homeless and stated that she has been unhoused for years. Pt seemed to have some delusional thinking stating that God was talking to her and stating that she had been disobedient and hadn't overcome. Pt could not explain further. Pt was not able to willing to answer some questions due to sleepiness and level of severe depression. Pt stated that she was suicidal but could not name a specific plan of action. Pt denied HI and substance use. Per pr's chart she has been diagnosed with Bipolar d/o in the past. Pt seemed to be experiencing delusional thinking making statements like people run away from me and think I'm Satan.. call; me that. Pt also stated, they see demons in me.   Pt was calm, sleepy, a bit  irritable, tearful at times, dressed casually with neglected grooming. Pt appeared disheveled and kept her head in her hands during the assessment and her hair in her face. Pt stated that she has not sleep well in years due to her homelessness. Pt stated that people continually tried to hurt her and lied to her. She was not any more specific. Pt was hopeless, felt helpless, fatigued, guilty/shameful, and disinterested in general.    Chief Complaint:  Chief Complaint  Patient presents with   Hallucinations   Agitation   Visit Diagnosis:  Bipolar d/o, depressed episode   CCA Screening, Triage and Referral (STR)  Patient Reported Information How did you hear about us ? Legal System  What Is the Reason for Your Visit/Call Today? Angela [name] is a 74 year old female presenting to [facility] escorted by GPD/BHRT. Pt states,  If I dont obey God I will die. Pt appears to be tearful, paranoid and agitated throughout triage. Pt endorses AH at this time, but denies VH. Pt states she is onoly taking blood pressure medication at this time. Pt denies substance use, Si, Hi and AVH, NSSIB.  How Long Has This Been Causing You Problems? <Week  What Do You Feel Would Help You the Most Today? Medication(s)   Have You Recently Had Any Thoughts About Hurting Yourself? No  Are You Planning to Commit Suicide/Harm Yourself At This time? No   Flowsheet Row ED from 01/30/2024 in [facility] ED from 01/21/2024 in [facility] Department at [facility] UC from 01/19/2024 in [facility] (  [place])  C-SSRS RISK CATEGORY No Risk No Risk No Risk    Have you Recently Had Thoughts About Hurting Someone Sherral? No  Are You Planning to Harm Someone at This Time? No  Explanation: Pt denied having SI/HI.   Have You Used Any Alcohol  or Drugs in the Past 24 Hours? No  How Long Ago Did You Use Drugs or Alcohol ? na What Did You Use and How Much?  na  Do You Currently Have a Therapist/Psychiatrist? No  Name of Therapist/Psychiatrist:    Have You Been Recently Discharged From Any Office Practice or Programs? No  Explanation of Discharge From Practice/Program: na    CCA Screening Triage Referral Assessment Type of Contact: Face-to-Face  Telemedicine Service Delivery:   Is this Initial or Reassessment?   Date Telepsych consult ordered in [facility]:    Time Telepsych consult ordered in [facility]:    Location of Assessment: [facility]  Provider Location: [facility] Assessment Services   Collateral Involvement: None provided   Does Patient Have a Automotive Engineer Guardian? No  Legal Guardian Contact Information: na  Copy of Legal Guardianship Form: -- (na)  Legal Guardian Notified of Arrival: -- (na)  Legal Guardian Notified of Pending Discharge: -- (na)  If Minor and Not Living with Parent(s), Who has Custody? adult  Is CPS involved or ever been involved? -- (none reported)  Is APS involved or ever been involved? -- (none reported)   Patient Determined To Be At Risk for Harm To Self or Others Based on Review of Patient Reported Information or Presenting Complaint? Yes, for Self-Harm  Method: No Plan  Availability of Means: No access or NA  Intent: Vague intent or NA  Notification Required: No need or identified person  Additional Information for Danger to Others Potential: -- (Pt was not able to answer many questions due to her sleepiness and level of depression.)  Additional Comments for Danger to Others Potential: na  Are There Guns or Other Weapons in Your Home? -- (Pt was not able to answer many questions due to her sleepiness and level of depression.)  Types of Guns/Weapons: na  Are These Weapons Safely Secured?                            -- (na)  Who Could Verify You Are Able To Have These Secured: na  Do You Have any Outstanding Charges, Pending Court Dates, Parole/Probation? Pt was not  able to answer many questions due to her sleepiness and level of depression.  Contacted To Inform of Risk of Harm To Self or Others: -- (na)    Does Patient Present under Involuntary Commitment? No    [place] of Residence: [place]   Patient Currently Receiving the Following Services: Not Receiving Services   Determination of Need: Emergent (2 hours) (Per Dr. Lynnette, pt is recommended for inpatient psychiatric admission.)   Options For Referral: Inpatient Hospitalization     CCA Biopsychosocial Patient Reported Schizophrenia/Schizoaffective Diagnosis in Past: No   Strengths: Patient is able to communicate and assert her needs.   Mental Health Symptoms Depression:  Change in energy/activity; Difficulty Concentrating; Fatigue; Hopelessness; Worthlessness; Irritability; Tearfulness; Sleep (too much or little)   Duration of Depressive symptoms: Duration of Depressive Symptoms: Greater than two weeks   Mania:  Racing thoughts; Irritability   Anxiety:   Tension; Worrying; Sleep; Irritability   Psychosis:  Delusions; Hallucinations (possible delusions voiced during assessment)  Duration of Psychotic symptoms: Duration of Psychotic Symptoms: N/A   Trauma:  None   Obsessions:  None   Compulsions:  None   Inattention:  N/A   Hyperactivity/Impulsivity:  N/A   Oppositional/Defiant Behaviors:  Temper   Emotional Irregularity:  Mood lability; Potentially harmful impulsivity; Intense/inappropriate anger; Transient, stress-related paranoia/disassociation; Chronic feelings of emptiness   Other Mood/Personality Symptoms:  n/a    Mental Status Exam Appearance and self-care  Stature:  Small   Weight:  Average weight   Clothing:  Casual; Disheveled   Grooming:  Neglected   Cosmetic use:  None   Posture/gait:  Slumped   Motor activity:  Slowed; Agitated   Sensorium  Attention:  Inattentive   Concentration:  Anxiety interferes; Scattered   Orientation:  X5    Recall/memory:  Normal   Affect and Mood  Affect:  Labile   Mood:  Anxious; Irritable   Relating  Eye contact:  Avoided   Facial expression:  Depressed; Sad; Tense   Attitude toward examiner:  Guarded; Cooperative; Dramatic; Irritable   Thought and Language  Speech flow: Paucity; Slow; Slurred   Thought content:  Appropriate to Mood and Circumstances   Preoccupation:  None (none voiced)   Hallucinations:  Auditory (Pt stated she was hearing God's voice telling her she has been disobedient.)   Organization:  Intact; Loose   Executive Iac[employer] of Knowledge:  Fair   Intelligence:  Average   Abstraction:  Functional   Judgement:  Poor; Impaired   Reality Testing:  Adequate   Insight:  Lacking   Decision Making:  Confused   Social Functioning  Social Maturity:  Impulsive   Social Judgement:  Heedless; Victimized   Stress  Stressors:  Housing; Office Manager Ability:  Exhausted; Overwhelmed   Skill Deficits:  Self-care; Interpersonal   Supports:  Support needed     Religion: Religion/Spirituality Are You A Religious Person?: No (per chart) How Might This Affect Treatment?: n/a  Leisure/Recreation: Leisure / Recreation Do You Have Hobbies?: No (Pt was not able to answer many questions due to her sleepiness and level of depression.)  Exercise/Diet: Exercise/Diet Do You Exercise?: No (walking with a walker) Have You Gained or Lost A Significant Amount of Weight in the Past Six Months?: No Do You Follow a Special Diet?: No Do You Have Any Trouble Sleeping?: Yes Explanation of Sleeping Difficulties: Pt reported that being unhoused without a safe place to sleep as a reason that she experiences sleep difficulties.   CCA Employment/Education Employment/Work Situation: Employment / Work Systems Developer: On disability (per chart) Why is Patient on Disability: Unknown How Long has Patient Been on Disability: Unknown Patient's  Job has Been Impacted by Current Illness: No Has Patient ever Been in the [employer]?: No (per chart)  Education: Education Is Patient Currently Attending School?: No Last Grade Completed:  (Pt was not able to answer many questions due to her sleepiness and level of depression.) Did You Attend College?: Yes (per chart) What Type of College Degree Do you Have?: na Did You Have An Individualized Education Program (IIEP): No (per chart) Did You Have Any Difficulty At School?: No (per chart)   CCA Family/Childhood History Family and Relationship History: Family history Marital status:  (Pt was not able to answer many questions due to her sleepiness and level of depression.) Does patient have children?: No (per chart)  Childhood History:  Childhood History By whom was/is the patient raised?: Both parents (per chart) Did patient  suffer any verbal/emotional/physical/sexual abuse as a child?: No (per chart) Has patient ever been sexually abused/assaulted/raped as an adolescent or adult?: No (per chart) Witnessed domestic violence?: No (per chart) Has patient been affected by domestic violence as an adult?: No       CCA Substance Use Alcohol /Drug Use: Alcohol  / Drug Use Pain Medications: See MAR Prescriptions: See MAR Over the Counter: See MAR History of alcohol  / drug use?: No history of alcohol  / drug abuse Longest period of sobriety (when/how long): Reports of none Negative Consequences of Use:  (n/a) Withdrawal Symptoms:  (n/a)                         ASAM's:  Six Dimensions of Multidimensional Assessment  Dimension 1:  Acute Intoxication and/or Withdrawal Potential:      Dimension 2:  Biomedical Conditions and Complications:      Dimension 3:  Emotional, Behavioral, or Cognitive Conditions and Complications:     Dimension 4:  Readiness to Change:     Dimension 5:  Relapse, Continued use, or Continued Problem Potential:     Dimension 6:  Recovery/Living  Environment:     ASAM Severity Score:    ASAM Recommended Level of Treatment:     Substance use Disorder (SUD)    Recommendations for Services/Supports/Treatments:    Disposition Recommendation per psychiatric provider: We recommend inpatient psychiatric hospitalization when medically cleared. Patient is under voluntary admission status at this time; please IVC if attempts to leave hospital. Per Dr. Lynnette, pt is recommended for inpatient psychiatric admission   DSM5 Diagnoses: Patient Active Problem List   Diagnosis Date Noted   Diverticulitis 07/04/2023   Stress reaction causing mixed disturbance of emotion and conduct 09/10/2021   Homeless    Mixed hyperlipidemia 07/02/2019   Hypothyroidism due to acquired atrophy of thyroid  04/29/2019   Osteopenia determined by x-ray 04/29/2019   Acquired claw toe of both feet 10/23/2018   Aortic atherosclerosis 05/31/2018   Achilles tendon disorder 04/25/2018   Diastolic dysfunction 03/29/2018   Mitral regurgitation 03/29/2018   Personality disorder (HCC) 12/25/2017   Arthritis of hand, degenerative 12/07/2017   Hallux valgus, acquired 07/05/2017   Hammer toes of both feet 07/05/2017   Unstable ankle 07/05/2017   Tinea unguium 05/08/2017   Conductive hearing loss of right ear with unrestricted hearing of left ear 04/03/2017   Adjustment disorder with depressed mood 01/18/2017   Migraine 01/18/2017   Irritable bowel syndrome with constipation 11/17/2016   Bilateral carpal tunnel syndrome 11/17/2016   Plantar fasciitis, bilateral 11/17/2016   Cervical myofascial pain syndrome 11/17/2016   Chronic hyponatremia 11/17/2016   PTSD (post-traumatic stress disorder) 11/17/2016   Hiatal hernia 11/17/2016   Vitamin D  deficiency 11/17/2016   Vitamin B12 deficiency 11/17/2016   Cervical spondylosis with myelopathy 05/24/2016   MCI (mild cognitive impairment) with memory loss 03/30/2016   Gait abnormality 03/30/2016   Chronic bipolar affective  disorder (HCC) 01/24/2016   Anemia, iron deficiency 06/24/2015   Benign essential HTN 06/24/2015   History of falling 05/22/2015   History of TIA (transient ischemic attack) 04/20/2015   Gastroesophageal reflux disease with hiatal hernia 04/20/2015   Cervical disc disorder at C5-C6 level with radiculopathy 03/25/2015   Chronic neck pain 03/25/2015   Pelvic pain in female 10/30/2014   History of skin cancer 06/30/2014   Chronic headaches 12/16/2013   Neuropathy 12/16/2013     Referrals to Alternative Service(s): Referred to Alternative Service(s):   Place:  Date:   Time:    Referred to Alternative Service(s):   Place:   Date:   Time:    Referred to Alternative Service(s):   Place:   Date:   Time:    Referred to Alternative Service(s):   Place:   Date:   Time:     Shweta Aman T, Counselor

## 2024-01-30 NOTE — ED Provider Notes (Signed)
 Behavioral Health Urgent Care Medical Screening Exam  Patient Name: Angela [name] MRN: [id number] Date of Evaluation: 01/30/2024 Chief Complaint:   Diagnosis:  Final diagnoses:  None    History of Present illness: Angela [name] is a 74 y.o. female with history of ?bipolar disorder, ?schizophrenia, borderline personality disorder, HTN, anemia, , MDD, housing instability, hiatal hernia, COPD, chronic lacunar infarct or right internal capsule presents to [facility] due to hallucinations and severe depression. She has had ~50 ED/UC visits in the past year. The frequency to which she has been to the ED is very high although does appear she had far fewer ED visits and more outpatient visits prior to 2023.   Patient appears very tired and disheveled in assessment room. She was very quiet and would often murmur during assessment. She endorses SI with no specified plan but denies VH. She endorses AH of hearing God's voices telling her she will die if she does not obey what God says. She would not tell me what God told her to do. She states she has been homeless for years. She also states she has not slept in years either. She states she is only taking hypertension medication. She endorses feeling severely depressed.  Patient unable to further participate in assessment due to somnolence. She briefly makes comments about Jesus and the [employer] but unable to elicit further details.   Past History primarily through chart review Past Psychiatric History: Schizophrenia, Bipolar Disorder, Borderline Personality Disorder, Adjustment disorder Past Psychiatric Hospitalizations: Multiple, unclear most recent one but was hospitalized in 2020 at [facility] due to manic behavior resulting in being kicked out of group home but then required ECT although appeared to have continued instability.  Past medication: olanzapine , haloperidol , paroxetine , trazodone , pregabalin, risperidone    Past Medical History: TIA, GERD,  iron deficiency anemia, GERD, hypertension, hypothyroidism  Flowsheet Row ED from 01/30/2024 in [facility] ED from 01/21/2024 in [facility] Department at [facility] UC from 01/19/2024 in [facility] at [facility])  C-SSRS RISK CATEGORY No Risk No Risk No Risk    Psychiatric Specialty Exam  Presentation  General Appearance: disheveled, elder appearing female slouching in chair Eye Contact: minimal Speech: low volume, muttering  Mood and Affect  Mood: depressed Affect: unstable, tearful and dysphoric at times  Thought Process  Thought Processes: tangential, difficult to redirect Orientation: axox4 but required significant prompting Thought Content: ruminative about God and Jesus Diagnosis of Schizophrenia or Schizoaffective disorder in past: Yes  Hallucinations: endorses AH of hearing Jesus Ideas of Reference: denies Suicidal Thoughts: endorses, but does not discloses if there is plan or intent Homicidal Thoughts:denies  Sensorium  Memory: not formally assessed Judgment:poor Insight:poor  Executive Functions  Concentration: poor Attention Span: poor Recall: UTA Fund of Knowledge: UTA Language: fair  Psychomotor Activity  Psychomotor Activity: decreased  Assets  Assets: resilience  Sleep  Sleep: poor  Physical Exam: Physical Exam ROS Blood pressure (!) 149/89, pulse 88, temperature 97.8 F (36.6 C), temperature source Oral, resp. rate 20, SpO2 99%. There is no height or weight on file to calculate BMI.  Musculoskeletal: Strength & Muscle Tone: decreased Gait & Station: requires walker to ambulate Patient leans: n/a   [facility] MSE Discharge Disposition for Follow up and Recommendations: Based on my evaluation I certify that psychiatric inpatient services furnished can reasonably be expected to improve the patient's condition which I recommend transfer to an appropriate accepting facility.    Patient symptoms  concerning for severe depressive episode and also endorsing psychotic features. Significant safety concerns given patient's multiple elopements from ED and hospitals. She has multiple medical problems and are getting labs to rule out other etiologies to patient's depressive symptoms and psychosis. Due to patient's agitation and personality, she has been barred from many clinics and multiple ED HU plans have been placed to ensure she receives appropriate care. Patient has been noted to be aggressive to other individuals although it is unclear whether this is impulsive or not as it does not appear to serve any purpose. She would likely benefit from being at a group home following psychiatric stabilization but will defer to inpatient psychiatrist to make that determination.   -Accepted to [facility] for psychiatric hospitalization -IVC due to psychosis and self-harm concerns -hydroxyzine  prn for anxiety -labs ordered: ECG, thiamine, folate, b12, RPR, TSH, lipid profile, ethanol level, magnesium , A1c, CMP, CBC, vitamin D , UDS, UA   Prentice Espy, MD 01/30/2024, 12:26 PM

## 2024-01-30 NOTE — ED Notes (Signed)
 Pt awake and continues to be boisterous ranting religious praise. Will raise hands in air to give praises and then lay on the floor stating I am resting in the arms of the Lord.  Respirations equal and unlabored. PRN meds retrieved and awaiting for patient to finish prayers, so anxiety and sleep meds can be administered. Q15 min safety checks maintained/ongoing.

## 2024-01-30 NOTE — Discharge Instructions (Addendum)
 Transfer to [facility]

## 2024-01-30 NOTE — Progress Notes (Signed)
 LCSW Progress Note  [id number]   Angela [name]  01/30/2024  10:44 AM  Description:   Inpatient Psychiatric Referral  Patient was recommended inpatient per  Dr Lynnette .There are no available beds at [facility], per [facility] Carlo RN). Patient was referred to the following out of network facilities:   [facility] Provider Address Phone Fax  [facility]  [address], [place] [address] [phone]  [facility]  [address] [place] [address] [phone]  [facility]  [address] [place], [place] [address] [phone]  [facility]  [address] [place] [address] [phone]  [facility]  [address], [place] [address] [phone]  [facility]  [address], [place] [address] [phone]  [facility] EFAX  [address] [place], [place] [phone]  [facility]  [address] [place] [address] [phone]  [facility]  [address], [place] [address] [phone]  [facility]  [address] [place] [address] [phone]      Situation ongoing, CSW to continue following and update chart as more information becomes available.      [name], MSW, LCSW  01/30/2024 10:44 AM

## 2024-01-30 NOTE — ED Notes (Addendum)
 Pt escorted back to [facility] observation after shower. Brad, MHT assisted this nurse with dressing and getting back to bed. This clinical research associate administered meds for sleep and anxiety. Pt was offered support and encouragement. Pt receptive to treatment and safety maintained on unit at this time. Q 15 min safety checks maintained/ongoing.

## 2024-01-31 DIAGNOSIS — F319 Bipolar disorder, unspecified: Secondary | ICD-10-CM | POA: Diagnosis not present

## 2024-01-31 LAB — SYPHILIS: RPR W/REFLEX TO RPR TITER AND TREPONEMAL ANTIBODIES, TRADITIONAL SCREENING AND DIAGNOSIS ALGORITHM: RPR Ser Ql: NONREACTIVE

## 2024-01-31 NOTE — ED Notes (Signed)
 [facility] transport called stating they would be here to transport pt to [facility] in approx. 15 minutes. Pt made aware. Paperwork complete for transfer. Safety maintained.

## 2024-01-31 NOTE — ED Notes (Signed)
 Pt awaken coughing, requesting water and inhaler. Pt walked out to water machine for water and inhaler was administered. Pt inhaled 2 puffs and returned to bed. Q 15 min safety checks maintained/ongoing.

## 2024-01-31 NOTE — ED Notes (Signed)
 Zyprexa  administered d/t pt agitation of inability to access phone ot search for apartments. Pt was redirectable after attempting to become boisterous with praying. Vital signs obtained at pt's request. Q 15 min safety checks maintained/ongoing.

## 2024-01-31 NOTE — ED Notes (Signed)
 Writer called [facility] transport to confirm pt's transport to [facility] this am. [facility] dispatch confirmed pickup.

## 2024-01-31 NOTE — ED Notes (Signed)
 Patient received am medication without difficulty. Pleasant and cooperative this am. Pt informed that she would be transported to [facility] this am and pt insisting she need her clothes to wear on the trip. Writer explained rules to pt and pt agitated but remain compliant. Pt denies SI/HI/AVH but observed responding to internal stimuli. Patient denies any physical complaints when asked. Support and encouragement provided. Routine safety checks conducted according to facility protocol. Encouraged patient to notify staff if thoughts of harm toward self or others arise. Patient verbalize understanding and agreement. Will continue to monitor for safety.

## 2024-01-31 NOTE — ED Notes (Signed)
 Pt in bed w/eyes closed resting quietly. Respirations equal and unlabored. No signs or symptoms of distress. Routine safety checks maintained/ongoing.

## 2024-01-31 NOTE — ED Notes (Signed)
 Patient A&O x 4, ambulatory. Patient transferred to [facility] via sheriff transport. Pt belongings given to sheriff transport driver from locker #5 intact. Patient escorted to [place] via staff for transport to destination. Safety maintained.

## 2024-02-02 LAB — VITAMIN B1: Vitamin B1 (Thiamine): 74.3 nmol/L (ref 66.5–200.0)
